# Patient Record
Sex: Female | Born: 1950 | Race: Black or African American | Hispanic: No | Marital: Single | Smoking: Never smoker
Health system: Southern US, Community
[De-identification: ages and names within clinical notes are randomized; demographics above are authoritative.]

## PROBLEM LIST (undated history)

## (undated) DIAGNOSIS — K31811 Angiodysplasia of stomach and duodenum with bleeding: Secondary | ICD-10-CM

## (undated) DIAGNOSIS — H543 Unqualified visual loss, both eyes: Secondary | ICD-10-CM

## (undated) DIAGNOSIS — M791 Myalgia, unspecified site: Secondary | ICD-10-CM

## (undated) DIAGNOSIS — H409 Unspecified glaucoma: Secondary | ICD-10-CM

## (undated) DIAGNOSIS — M1990 Unspecified osteoarthritis, unspecified site: Secondary | ICD-10-CM

## (undated) DIAGNOSIS — G629 Polyneuropathy, unspecified: Secondary | ICD-10-CM

## (undated) DIAGNOSIS — Z86010 Personal history of colonic polyps: Secondary | ICD-10-CM

## (undated) DIAGNOSIS — R0602 Shortness of breath: Secondary | ICD-10-CM

## (undated) DIAGNOSIS — D649 Anemia, unspecified: Secondary | ICD-10-CM

## (undated) DIAGNOSIS — K922 Gastrointestinal hemorrhage, unspecified: Secondary | ICD-10-CM

## (undated) DIAGNOSIS — Z8489 Family history of other specified conditions: Secondary | ICD-10-CM

## (undated) DIAGNOSIS — K219 Gastro-esophageal reflux disease without esophagitis: Secondary | ICD-10-CM

## (undated) DIAGNOSIS — I739 Peripheral vascular disease, unspecified: Secondary | ICD-10-CM

## (undated) DIAGNOSIS — Z5189 Encounter for other specified aftercare: Secondary | ICD-10-CM

## (undated) DIAGNOSIS — I509 Heart failure, unspecified: Secondary | ICD-10-CM

## (undated) DIAGNOSIS — I10 Essential (primary) hypertension: Secondary | ICD-10-CM

## (undated) DIAGNOSIS — F419 Anxiety disorder, unspecified: Secondary | ICD-10-CM

## (undated) DIAGNOSIS — K2211 Ulcer of esophagus with bleeding: Secondary | ICD-10-CM

## (undated) DIAGNOSIS — C50919 Malignant neoplasm of unspecified site of unspecified female breast: Secondary | ICD-10-CM

## (undated) DIAGNOSIS — J45909 Unspecified asthma, uncomplicated: Secondary | ICD-10-CM

## (undated) DIAGNOSIS — R011 Cardiac murmur, unspecified: Secondary | ICD-10-CM

## (undated) DIAGNOSIS — N186 End stage renal disease: Secondary | ICD-10-CM

## (undated) DIAGNOSIS — E119 Type 2 diabetes mellitus without complications: Secondary | ICD-10-CM

## (undated) HISTORY — PX: ABDOMINAL HYSTERECTOMY: SHX81

## (undated) HISTORY — DX: Unqualified visual loss, both eyes: H54.3

## (undated) HISTORY — DX: Gastro-esophageal reflux disease without esophagitis: K21.9

## (undated) HISTORY — DX: Angiodysplasia of stomach and duodenum with bleeding: K31.811

## (undated) HISTORY — PX: CARDIAC CATHETERIZATION: SHX172

## (undated) HISTORY — DX: Cardiac murmur, unspecified: R01.1

## (undated) HISTORY — DX: Ulcer of esophagus with bleeding: K22.11

## (undated) HISTORY — DX: Myalgia, unspecified site: M79.10

## (undated) HISTORY — PX: BREAST BIOPSY: SHX20

## (undated) HISTORY — DX: Anemia, unspecified: D64.9

## (undated) HISTORY — DX: Gastrointestinal hemorrhage, unspecified: K92.2

## (undated) HISTORY — DX: Essential (primary) hypertension: I10

## (undated) HISTORY — DX: Unspecified glaucoma: H40.9

## (undated) HISTORY — PX: CATARACT EXTRACTION W/ ANTERIOR VITRECTOMY: SHX1306

## (undated) HISTORY — PX: EYE SURGERY: SHX253

## (undated) HISTORY — PX: TONSILLECTOMY: SUR1361

## (undated) HISTORY — DX: Polyneuropathy, unspecified: G62.9

## (undated) HISTORY — PX: COLONOSCOPY: SHX174

## (undated) HISTORY — PX: BREAST LUMPECTOMY: SHX2

## (undated) HISTORY — PX: REFRACTIVE SURGERY: SHX103

## (undated) HISTORY — DX: Personal history of colonic polyps: Z86.010

## (undated) HISTORY — DX: Heart failure, unspecified: I50.9

## (undated) HISTORY — DX: Unspecified asthma, uncomplicated: J45.909

## (undated) HISTORY — PX: UPPER GASTROINTESTINAL ENDOSCOPY: SHX188

## (undated) HISTORY — DX: Unspecified osteoarthritis, unspecified site: M19.90

## (undated) HISTORY — DX: Encounter for other specified aftercare: Z51.89

---

## 1998-04-04 ENCOUNTER — Emergency Department (HOSPITAL_COMMUNITY): Admission: EM | Admit: 1998-04-04 | Discharge: 1998-04-04 | Payer: Self-pay | Admitting: Emergency Medicine

## 1998-04-04 ENCOUNTER — Encounter: Payer: Self-pay | Admitting: Emergency Medicine

## 1999-06-07 ENCOUNTER — Other Ambulatory Visit: Admission: RE | Admit: 1999-06-07 | Discharge: 1999-06-07 | Payer: Self-pay | Admitting: Obstetrics & Gynecology

## 2000-12-21 ENCOUNTER — Other Ambulatory Visit: Admission: RE | Admit: 2000-12-21 | Discharge: 2000-12-21 | Payer: Self-pay | Admitting: Obstetrics & Gynecology

## 2001-10-26 ENCOUNTER — Emergency Department (HOSPITAL_COMMUNITY): Admission: EM | Admit: 2001-10-26 | Discharge: 2001-10-26 | Payer: Self-pay | Admitting: *Deleted

## 2002-03-10 ENCOUNTER — Other Ambulatory Visit: Admission: RE | Admit: 2002-03-10 | Discharge: 2002-03-10 | Payer: Self-pay | Admitting: Obstetrics & Gynecology

## 2002-09-30 ENCOUNTER — Encounter: Admission: RE | Admit: 2002-09-30 | Discharge: 2002-09-30 | Payer: Self-pay | Admitting: *Deleted

## 2002-09-30 ENCOUNTER — Encounter: Payer: Self-pay | Admitting: *Deleted

## 2003-12-14 ENCOUNTER — Other Ambulatory Visit: Admission: RE | Admit: 2003-12-14 | Discharge: 2003-12-14 | Payer: Self-pay | Admitting: Obstetrics & Gynecology

## 2004-06-25 ENCOUNTER — Observation Stay (HOSPITAL_COMMUNITY): Admission: EM | Admit: 2004-06-25 | Discharge: 2004-06-26 | Payer: Self-pay | Admitting: Emergency Medicine

## 2004-11-25 ENCOUNTER — Encounter: Admission: RE | Admit: 2004-11-25 | Discharge: 2004-11-25 | Payer: Self-pay | Admitting: Orthopedic Surgery

## 2004-12-17 ENCOUNTER — Encounter: Admission: RE | Admit: 2004-12-17 | Discharge: 2004-12-17 | Payer: Self-pay | Admitting: Orthopedic Surgery

## 2005-01-01 ENCOUNTER — Encounter: Admission: RE | Admit: 2005-01-01 | Discharge: 2005-01-01 | Payer: Self-pay | Admitting: Orthopedic Surgery

## 2005-06-02 ENCOUNTER — Other Ambulatory Visit: Admission: RE | Admit: 2005-06-02 | Discharge: 2005-06-02 | Payer: Self-pay | Admitting: Obstetrics & Gynecology

## 2006-08-24 ENCOUNTER — Ambulatory Visit (HOSPITAL_COMMUNITY): Admission: RE | Admit: 2006-08-24 | Discharge: 2006-08-24 | Payer: Self-pay | Admitting: Internal Medicine

## 2006-08-24 ENCOUNTER — Ambulatory Visit: Payer: Self-pay | Admitting: Vascular Surgery

## 2006-11-12 ENCOUNTER — Emergency Department (HOSPITAL_COMMUNITY): Admission: EM | Admit: 2006-11-12 | Discharge: 2006-11-12 | Payer: Self-pay | Admitting: Emergency Medicine

## 2007-02-02 ENCOUNTER — Encounter: Admission: RE | Admit: 2007-02-02 | Discharge: 2007-02-02 | Payer: Self-pay | Admitting: Obstetrics & Gynecology

## 2007-08-23 ENCOUNTER — Ambulatory Visit (HOSPITAL_COMMUNITY): Admission: RE | Admit: 2007-08-23 | Discharge: 2007-08-23 | Payer: Self-pay | Admitting: Ophthalmology

## 2009-02-06 ENCOUNTER — Emergency Department (HOSPITAL_COMMUNITY): Admission: EM | Admit: 2009-02-06 | Discharge: 2009-02-06 | Payer: Self-pay | Admitting: Emergency Medicine

## 2009-04-17 ENCOUNTER — Emergency Department (HOSPITAL_COMMUNITY): Admission: EM | Admit: 2009-04-17 | Discharge: 2009-04-17 | Payer: Self-pay | Admitting: Emergency Medicine

## 2009-12-21 ENCOUNTER — Emergency Department (HOSPITAL_COMMUNITY): Admission: EM | Admit: 2009-12-21 | Discharge: 2009-12-21 | Payer: Self-pay | Admitting: Emergency Medicine

## 2010-06-12 ENCOUNTER — Encounter: Admission: RE | Admit: 2010-06-12 | Discharge: 2010-06-12 | Payer: Self-pay | Admitting: Obstetrics & Gynecology

## 2010-06-19 ENCOUNTER — Encounter: Admission: RE | Admit: 2010-06-19 | Discharge: 2010-06-19 | Payer: Self-pay | Admitting: Obstetrics & Gynecology

## 2010-06-27 ENCOUNTER — Ambulatory Visit: Payer: Self-pay | Admitting: Oncology

## 2010-07-09 LAB — CBC WITH DIFFERENTIAL/PLATELET
Basophils Absolute: 0 10*3/uL (ref 0.0–0.1)
Eosinophils Absolute: 0.2 10*3/uL (ref 0.0–0.5)
HCT: 34.9 % (ref 34.8–46.6)
HGB: 11.4 g/dL — ABNORMAL LOW (ref 11.6–15.9)
LYMPH%: 40.6 % (ref 14.0–49.7)
MCV: 84.9 fL (ref 79.5–101.0)
MONO#: 0.5 10*3/uL (ref 0.1–0.9)
MONO%: 6.7 % (ref 0.0–14.0)
NEUT#: 3.6 10*3/uL (ref 1.5–6.5)
Platelets: 384 10*3/uL (ref 145–400)
RBC: 4.11 10*6/uL (ref 3.70–5.45)
WBC: 7.2 10*3/uL (ref 3.9–10.3)

## 2010-07-09 LAB — COMPREHENSIVE METABOLIC PANEL
Albumin: 2.8 g/dL — ABNORMAL LOW (ref 3.5–5.2)
Alkaline Phosphatase: 75 U/L (ref 39–117)
BUN: 18 mg/dL (ref 6–23)
CO2: 29 mEq/L (ref 19–32)
Glucose, Bld: 308 mg/dL — ABNORMAL HIGH (ref 70–99)
Sodium: 138 mEq/L (ref 135–145)
Total Bilirubin: 0.5 mg/dL (ref 0.3–1.2)
Total Protein: 6.6 g/dL (ref 6.0–8.3)

## 2010-07-09 LAB — CANCER ANTIGEN 27.29: CA 27.29: 34 U/mL (ref 0–39)

## 2010-07-11 ENCOUNTER — Encounter: Payer: Self-pay | Admitting: Oncology

## 2010-07-11 ENCOUNTER — Ambulatory Visit
Admission: RE | Admit: 2010-07-11 | Discharge: 2010-07-11 | Payer: Self-pay | Source: Home / Self Care | Attending: Oncology | Admitting: Oncology

## 2010-07-18 LAB — CBC WITH DIFFERENTIAL/PLATELET
BASO%: 0.5 % (ref 0.0–2.0)
Eosinophils Absolute: 0.1 10*3/uL (ref 0.0–0.5)
HCT: 36.1 % (ref 34.8–46.6)
MCHC: 33 g/dL (ref 31.5–36.0)
MONO#: 0.5 10*3/uL (ref 0.1–0.9)
NEUT#: 2.8 10*3/uL (ref 1.5–6.5)
RBC: 4.26 10*6/uL (ref 3.70–5.45)
WBC: 5.9 10*3/uL (ref 3.9–10.3)
lymph#: 2.5 10*3/uL (ref 0.9–3.3)
nRBC: 0 % (ref 0–0)

## 2010-07-18 LAB — COMPREHENSIVE METABOLIC PANEL
ALT: 24 U/L (ref 0–35)
AST: 23 U/L (ref 0–37)
Albumin: 3.4 g/dL — ABNORMAL LOW (ref 3.5–5.2)
CO2: 27 mEq/L (ref 19–32)
Calcium: 9.1 mg/dL (ref 8.4–10.5)
Chloride: 104 mEq/L (ref 96–112)
Potassium: 4.3 mEq/L (ref 3.5–5.3)
Total Protein: 6.5 g/dL (ref 6.0–8.3)

## 2010-07-26 ENCOUNTER — Encounter (INDEPENDENT_AMBULATORY_CARE_PROVIDER_SITE_OTHER): Payer: Self-pay | Admitting: General Surgery

## 2010-07-26 ENCOUNTER — Ambulatory Visit (HOSPITAL_COMMUNITY)
Admission: RE | Admit: 2010-07-26 | Discharge: 2010-07-26 | Payer: Self-pay | Source: Home / Self Care | Attending: General Surgery | Admitting: General Surgery

## 2010-07-26 ENCOUNTER — Encounter
Admission: RE | Admit: 2010-07-26 | Discharge: 2010-07-26 | Payer: Self-pay | Source: Home / Self Care | Attending: General Surgery | Admitting: General Surgery

## 2010-07-26 LAB — GLUCOSE, CAPILLARY
Glucose-Capillary: 86 mg/dL (ref 70–99)
Glucose-Capillary: 86 mg/dL (ref 70–99)
Glucose-Capillary: 100 mg/dL — ABNORMAL HIGH (ref 70–99)

## 2010-07-31 ENCOUNTER — Encounter: Payer: Self-pay | Admitting: Oncology

## 2010-07-31 ENCOUNTER — Ambulatory Visit (HOSPITAL_COMMUNITY)
Admission: RE | Admit: 2010-07-31 | Discharge: 2010-07-31 | Payer: Self-pay | Source: Home / Self Care | Attending: Oncology | Admitting: Oncology

## 2010-07-31 ENCOUNTER — Ambulatory Visit: Payer: Self-pay | Admitting: Oncology

## 2010-08-01 LAB — CBC WITH DIFFERENTIAL/PLATELET
BASO%: 0.6 % (ref 0.0–2.0)
Basophils Absolute: 0 10*3/uL (ref 0.0–0.1)
EOS%: 2.8 % (ref 0.0–7.0)
Eosinophils Absolute: 0.2 10*3/uL (ref 0.0–0.5)
HCT: 33.8 % — ABNORMAL LOW (ref 34.8–46.6)
HGB: 11 g/dL — ABNORMAL LOW (ref 11.6–15.9)
LYMPH%: 36.2 % (ref 14.0–49.7)
MCH: 27.6 pg (ref 25.1–34.0)
MCHC: 32.5 g/dL (ref 31.5–36.0)
MCV: 84.9 fL (ref 79.5–101.0)
MONO#: 0.5 10*3/uL (ref 0.1–0.9)
MONO%: 7.4 % (ref 0.0–14.0)
NEUT#: 3.4 10*3/uL (ref 1.5–6.5)
NEUT%: 53 % (ref 38.4–76.8)
Platelets: 373 10*3/uL (ref 145–400)
RBC: 3.98 10*6/uL (ref 3.70–5.45)
RDW: 13.5 % (ref 11.2–14.5)
WBC: 6.5 10*3/uL (ref 3.9–10.3)
lymph#: 2.3 10*3/uL (ref 0.9–3.3)
nRBC: 0 % (ref 0–0)

## 2010-08-01 LAB — COMPREHENSIVE METABOLIC PANEL
ALT: 27 U/L (ref 0–35)
AST: 21 U/L (ref 0–37)
Albumin: 3.4 g/dL — ABNORMAL LOW (ref 3.5–5.2)
Alkaline Phosphatase: 80 U/L (ref 39–117)
BUN: 17 mg/dL (ref 6–23)
CO2: 25 mEq/L (ref 19–32)
Calcium: 8.8 mg/dL (ref 8.4–10.5)
Chloride: 102 mEq/L (ref 96–112)
Creatinine, Ser: 0.9 mg/dL (ref 0.40–1.20)
Glucose, Bld: 299 mg/dL — ABNORMAL HIGH (ref 70–99)
Potassium: 4.4 mEq/L (ref 3.5–5.3)
Sodium: 137 mEq/L (ref 135–145)
Total Bilirubin: 0.4 mg/dL (ref 0.3–1.2)
Total Protein: 6.5 g/dL (ref 6.0–8.3)

## 2010-08-05 LAB — GLUCOSE, CAPILLARY
Glucose-Capillary: 124 mg/dL — ABNORMAL HIGH (ref 70–99)
Glucose-Capillary: 142 mg/dL — ABNORMAL HIGH (ref 70–99)

## 2010-08-07 LAB — CBC WITH DIFFERENTIAL/PLATELET
BASO%: 0.2 % (ref 0.0–2.0)
Basophils Absolute: 0 10*3/uL (ref 0.0–0.1)
EOS%: 0.6 % (ref 0.0–7.0)
Eosinophils Absolute: 0.1 10*3/uL (ref 0.0–0.5)
HCT: 29.5 % — ABNORMAL LOW (ref 34.8–46.6)
HGB: 9.8 g/dL — ABNORMAL LOW (ref 11.6–15.9)
LYMPH%: 7.8 % — ABNORMAL LOW (ref 14.0–49.7)
MCH: 27.7 pg (ref 25.1–34.0)
MCHC: 33.2 g/dL (ref 31.5–36.0)
MCV: 83.3 fL (ref 79.5–101.0)
MONO#: 1.5 10*3/uL — ABNORMAL HIGH (ref 0.1–0.9)
MONO%: 10.4 % (ref 0.0–14.0)
NEUT#: 11.8 10*3/uL — ABNORMAL HIGH (ref 1.5–6.5)
NEUT%: 81 % — ABNORMAL HIGH (ref 38.4–76.8)
Platelets: 374 10*3/uL (ref 145–400)
RBC: 3.54 10*6/uL — ABNORMAL LOW (ref 3.70–5.45)
RDW: 13.5 % (ref 11.2–14.5)
WBC: 14.5 10*3/uL — ABNORMAL HIGH (ref 3.9–10.3)
lymph#: 1.1 10*3/uL (ref 0.9–3.3)
nRBC: 0 % (ref 0–0)

## 2010-08-08 ENCOUNTER — Inpatient Hospital Stay (HOSPITAL_COMMUNITY)
Admission: AD | Admit: 2010-08-08 | Discharge: 2010-08-11 | Payer: Self-pay | Source: Home / Self Care | Attending: General Surgery | Admitting: General Surgery

## 2010-08-12 LAB — GLUCOSE, CAPILLARY
Glucose-Capillary: 327 mg/dL — ABNORMAL HIGH (ref 70–99)
Glucose-Capillary: 382 mg/dL — ABNORMAL HIGH (ref 70–99)
Glucose-Capillary: 162 mg/dL — ABNORMAL HIGH (ref 70–99)

## 2010-08-12 LAB — CBC
Hemoglobin: 8.7 g/dL — ABNORMAL LOW (ref 12.0–15.0)
MCH: 27.5 pg (ref 26.0–34.0)
MCV: 82.6 fL (ref 78.0–100.0)
RBC: 3.16 MIL/uL — ABNORMAL LOW (ref 3.87–5.11)

## 2010-08-12 LAB — BASIC METABOLIC PANEL
CO2: 26 mEq/L (ref 19–32)
Calcium: 8.4 mg/dL (ref 8.4–10.5)
Chloride: 96 mEq/L (ref 96–112)
GFR calc Af Amer: 54 mL/min — ABNORMAL LOW (ref >60)
Sodium: 129 mEq/L — ABNORMAL LOW (ref 135–145)

## 2010-08-13 LAB — COMPREHENSIVE METABOLIC PANEL
ALT: 91 U/L — ABNORMAL HIGH (ref 0–35)
AST: 51 U/L — ABNORMAL HIGH (ref 0–37)
Albumin: 1.6 g/dL — ABNORMAL LOW (ref 3.5–5.2)
Alkaline Phosphatase: 146 U/L — ABNORMAL HIGH (ref 39–117)
BUN: 20 mg/dL (ref 6–23)
Chloride: 107 mEq/L (ref 96–112)
GFR calc Af Amer: >60 mL/min (ref >60)
Potassium: 3.3 mEq/L — ABNORMAL LOW (ref 3.5–5.1)
Sodium: 141 mEq/L (ref 135–145)
Total Bilirubin: 0.3 mg/dL (ref 0.3–1.2)
Total Protein: 5.8 g/dL — ABNORMAL LOW (ref 6.0–8.3)

## 2010-08-13 LAB — CBC
HCT: 25.2 % — ABNORMAL LOW (ref 36.0–46.0)
Hemoglobin: 8.3 g/dL — ABNORMAL LOW (ref 12.0–15.0)
MCH: 27.5 pg (ref 26.0–34.0)
MCHC: 32.9 g/dL (ref 30.0–36.0)
MCV: 83.4 fL (ref 78.0–100.0)
Platelets: 496 K/uL — ABNORMAL HIGH (ref 150–400)
RBC: 3.02 MIL/uL — ABNORMAL LOW (ref 3.87–5.11)
RDW: 13.8 % (ref 11.5–15.5)
WBC: 9 K/uL (ref 4.0–10.5)

## 2010-08-13 LAB — GLUCOSE, CAPILLARY
Glucose-Capillary: 104 mg/dL — ABNORMAL HIGH (ref 70–99)
Glucose-Capillary: 143 mg/dL — ABNORMAL HIGH (ref 70–99)
Glucose-Capillary: 147 mg/dL — ABNORMAL HIGH (ref 70–99)
Glucose-Capillary: 174 mg/dL — ABNORMAL HIGH (ref 70–99)

## 2010-08-13 LAB — TSH: TSH: 1.19 u[IU]/mL (ref 0.350–4.500)

## 2010-08-13 LAB — IRON AND TIBC
Iron: 25 ug/dL — ABNORMAL LOW (ref 42–135)
Saturation Ratios: 17 % — ABNORMAL LOW (ref 20–55)
TIBC: 144 ug/dL — ABNORMAL LOW (ref 250–470)
UIBC: 119 ug/dL

## 2010-09-01 NOTE — Discharge Summary (Signed)
[name], [name]              ACCOUNT NO.:  [id number]  MEDICAL RECORD NO.:  [id number]          PATIENT TYPE:  INP  LOCATION:  1519                         FACILITY:  [facility]  PHYSICIAN:  Edsel Petrin. Dalbert Batman, M.D.DATE OF BIRTH:  1950-12-14  DATE OF ADMISSION:  08/08/2010 DATE OF DISCHARGE:  08/11/2010                              DISCHARGE SUMMARY   FINAL DIAGNOSES: 1. Left axillary abscess. 2. Invasive cancer, left breast, stage T1c N0, status post recent left     partial mastectomy and left axillary sentinel node biopsy. 3. Diabetes mellitus. 4. Hypertension. 5. Gastroesophageal reflux disease. 6. Iron deficiency anemia.  OPERATION PERFORMED:  A bedside wound drainage procedure.  HISTORY:  This is a 59 year old African American female with diabetes and hypertension.  She had recently undergone a left lumpectomy and sentinel node biopsy for a stage T1c N0 tumor.  She presented back within 8 or 9 days with swelling and redness of the left axillary incision.  She saw Dr. Rolm Bookbinder in the office who partially drained an abscess with purulent drainage.  He admitted her to the hospital and put her on broad-spectrum antibiotics and diabetes management.  PHYSICAL EXAMINATION:  VITAL SIGNS:  Temperature 97.3. LUNGS:  Clear to auscultation. HEART:  Regular rate and rhythm, no murmur. CHEST:  Breast incision looked fine on the left breast.  Left actually incision was swollen and tender and erythematous with purulent drainage.  HOSPITAL COURSE:  The patient was admitted and started on IV fluids, broad-spectrum antibiotics, and sliding scale insulin as well as her usual medications.  The following morning, I saw her and she was stable and the pain was better.  She still had purulent drainage from the wound and so I explored the wound at the bedside.  I removed a couple of Vicryl sutures and was able to completely open up the wound and drain it completely out and irrigate  it out and pack it all the way to the bottom of the wound.  Because her initial blood sugar on admission was 371, I asked her primary care physician, Dr. Berneta Sages, to evaluate and manage her diabetes.  He was kind enough to come and assist with adjustment of her medications and sliding scale and her blood sugars came under nice control with that.  We began wound care and she responded quite promptly.  She was continued on intravenous Unasyn.  She was discharged on January 22nd.  At that time, she was doing well, was very comfortable, was afebrile.  Blood sugars were down to 147.  For her iron deficiency anemia, she received IV iron therapy on January 21st at the direction of Dr. Einar Grad Avva's group.  He felt that she was stable on her current medications for diabetes and felt that she would need to continue 70/30 insulin as an outpatient.  We arranged for home health nursing to help her with b.i.d. dressing changes.  DISCHARGE MEDICATIONS: 1. Augmentin 875 mg p.o. b.i.d. for 7 days. 2. Prilosec OTC 20 mg a day. 3. Aspirin 81 mg a day. 4. Lasix 80 mg a day. 5. Humulin 70/30 insulin 3 times daily  as a sliding scale. 6. Ibuprofen 800 mg 1/2 to 1 tablet by mouth every 8 hours as needed. 7. Iron 65 mg daily. 8. Lisinopril 40 mg daily. 9. Metformin 500 mg daily. 10.Multivitamins daily. 11.Polyethylene glycol p.r.n. constipation. 12.Tylenol p.r.n. pain.  DISCHARGE INSTRUCTIONS:  She was asked to return to see me in my office in 3-5 days.  [facility] for home health was arranged.  BID dressing changes will continue.     Edsel Petrin. Dalbert Batman, M.D.     HMI/MEDQ  D:  08/27/2010  T:  08/28/2010  Job:  [id number]  cc:   Berneta Sages, M.D. [phone]  Electronically Signed by Fanny Skates M.D. on 09/01/2010 03:30:17 PM

## 2010-09-04 NOTE — Consult Note (Signed)
NAMEMARTEEN, [name]              ACCOUNT NO.:  [id number]  MEDICAL RECORD NO.:  [id number]          PATIENT TYPE:  INP  LOCATION:  [id number]                         FACILITY:  [facility]  PHYSICIAN:  Berneta Sages, M.D.        DATE OF BIRTH:  [birth year]  DATE OF CONSULTATION:  08/09/2010 DATE OF DISCHARGE:                                CONSULTATION   REASON FOR CONSULTATION:  Type 2 diabetes management and general medical issues.  HISTORY OF PRESENT ILLNESS:  This is a 60 year old African-American female who has a long history of poorly controlled type 2 diabetes mellitus complicated by noncompliance and poor fund of knowledge who is insulin-dependent and recently diagnosed with invasive ductal carcinoma of the left breast in November 2011.  She is status post Port-A-Cath placement and recently noted a worsening lump and pain in the left axillary area.  This was complicated by fevers and hyperglycemia despite using her scheduled insulin in a compliant manner over the last 1-2 weeks.  She was seen by the general surgeons on August 08, 2010, where she was found to have an abscess which was attempted to be drained inside the office.  However, given the severity, pain and size, she was admitted to [facility] for surgical management.  She did undergo that within the last 24 hours with significant relief of her pain and nausea.  We have been asked to manage her medical issues including her uncontrolled type 2 diabetes in the setting of hyponatremia, hypertension and anemia.  Currently the patient states that her nausea has improved significantly and her pain has improved significantly status post incision and drainage of the left axillary abscess.  She denies any chest pain, shortness of breath, abdominal pain, however, does note constipation complicated by her use of iron supplementation.  She denies any focal neurological deficits but does continue to have some upper extremity  paresthesias that are somewhat chronic and possibly related to her known history of cervical degenerative disk disease.  She was actually evaluated for that in 2011 through emergency room evaluation and neurological consultation where she was ruled out for acute strokes and MS based on exam and radiological evaluation.  She now has been admitted for IV antibiotics status post drainage of the abscess with hopeful plans for quick discharge in the next 2-3 days per surgical admission.  REVIEW OF SYSTEMS:  Positive for fevers, chills, nausea, no vomiting, right shoulder discomfort and pain.  Negative for new focal neurological deficits, musculoskeletal issues.  Negative for blood in stool or urine. Negative for acute visual changes.  Negative for headaches.  Positive for polydipsia and polyuria.  Positive for some GERD, fairly well- controlled with outpatient proton pump inhibitors.  The patient states that her Port-A-Cath area has not been bothersome.  PROBLEM LIST: 1. Type 2 diabetes, uncontrolled with last hemoglobin A1c being 10.7%     in our office in December 2011.  However, with recent attempts on     the patient's part for better control by taking her insulin twice     daily, where as in the past she was lucky to  take it once a day.     She has missed several followup office visits in our office and her     knowledge base and fund of knowledge for preventing microvascular     and macrovascular complications including infection is quite     limited. 2. Hypertension. 3. Hyperlipidemia with deferral of statin therapy in the past and     possibly complicated by poor dietary habits. 4. Cervical radiculopathy with the patient deferring outpatient     neurological evaluation. 5. Mood disorder. 6. Gastroesophageal reflux disease. 7. New diagnosis of invasive ductal carcinoma of the breast diagnosed     2009/10/21, status post surgical excision by Dr. Fanny Skates followed     by  chemotherapy by Dr. Humphrey Rolls.  PAST SURGICAL HISTORY:  Partial hysterectomy in 10-22-94, tubal ligation, tonsillectomy and now surgical excision of the invasive ductal carcinoma.  FAMILY HISTORY:  Significant for father having died at the age 52. Mother having died at the age of 45 with complications of type 2 diabetes, goiter and hypertension.  The patient does have two brothers and a sister.  One brother with diabetes and end-stage renal disease deceased in 2003-10-22.  SOCIAL HISTORY:  The patient is single with two children.  Does have a significant other.  Is occupied at a mill.  Denies any tobacco or alcohol use.  CURRENT MEDICATIONS: 1. Lisinopril 40 mg each day. 2. Furosemide 80 mg each day. 3. Multivitamin one p.o. daily. 4. Omeprazole 20 mg each day. 5. Metformin 500 mg to be taken with the largest meal of the day. 6. Humalog 70/30 with most recent instructions to check blood sugars     twice a day with if the morning sugar is less than 100 to give 30     units, greater than 100 to give 40 units, and greater than 200 to     give 45 units.  In the evening if sugar is less than 100 to give no     insulin, if it is 101-150 to give 35 units, 151-200 to give 40     units, 201-250 to give 45 units and greater than 250 to give 50     units and she was to follow up in our office if she has any     recurrent episodes of low sugars less than 70 and she was also to     see our nurse practitioner, Collie Siad Drinkard, for further evaluation     and management on a regular basis to possibly include monthly     visits for optimization of her insulin regimen.  ALLERGIES:  She does have allergies to HYDROCODONE, possibly resulting in significant nausea and vomiting.  LABORATORY DATA:  Current labs include a sodium of 129, potassium 3.5, BUN 27, creatinine 1.23, serum CO2 26, glucose 371, calcium 8.4.  White blood cell count 14.3, hemoglobin 8.7, hematocrit 26.1%, platelet count 439.  The patient is  needing 100% of her intake.  Last CBG reveals 186 and 382.  PHYSICAL EXAMINATION:  GENERAL:  We have a pleasant African-American female sitting on the edge of her bed, answering all questions appropriately, in no apparent distress, status post surgery with family present. VITAL SIGNS:  Temperature 99.1 degrees Fahrenheit, pulse 83, respirations 20 and nonlabored, oxygen saturation 93% on room air, blood pressure 134/75. HEENT:  Sclerae anicteric.  Extraocular movements are intact.  Face is symmetric.  Tongue is midline. NECK:  Supple.  There is no cervical lymphadenopathy.  Left shoulder  and Port-A-Cath area were not examined. LUNGS:  Clear to auscultation bilaterally. CARDIOVASCULAR:  Exam reveals a regular rate and rhythm without murmurs appreciated. ABDOMEN:  Exam reveals a soft, nontender, nondistended abdomen.  Bowel sounds are present. EXTREMITIES:  Exam reveals no edema.  Pedal pulses are intact with no evidence of cyanosis or edema.  The patient can move all four extremities, albeit with limited range of motion of the left upper extremity secondary to recent surgery. NEUROLOGIC:  Exam is remarkable for being able to move all four extremities with some subjective decrease in light touch in the peripheral extremities.  No tremors.  The patient is alert and oriented x3.  ASSESSMENT AND PLAN: 1. Type 2 diabetes, uncontrolled, complicated by history of     noncompliance and poor fund of knowledge but recently the patient     has been relatively good with twice daily capillary blood glucose     measurements as well as insulin administration but complicated by     current infection.  We will be aggressive with insulin and sliding     scale.  We will keep the patient on 70/30 insulin given cost issues     and noncompliance with other forms of insulin on an outpatient     basis.  We will restart home regimen and dose escalate over the     weekend. 2. Hyponatremia.  Unclear  etiology but currently the patient is     receiving normal saline with her intravenous fluids and I will     recheck sodium in the morning as well as a thyroid-stimulating     hormone. 3. Hypertension.  Blood pressure controlled with reasonable renal     parameters at this time on current medications. 4. Gastroesophageal reflux disease.  We will continue proton pump     inhibitor. 5. Anemia.  Question iron deficiency and/or possible blood loss anemia     secondary to recent surgical interventions.  The patient may not be     compliant with iron secondary to constipation.  We will check iron     levels and consider intravenous iron infusion per Pharmacy protocol     if the iron levels are low. 6. Breast cancer.  Per Dr. Dalbert Batman and Dr. Humphrey Rolls but healing and     prevention of infection will be dependent on adequate glycemic     control and we will try to facilitate this on an outpatient basis.     Berneta Sages, M.D.     RA/MEDQ  D:  08/09/2010  T:  08/09/2010  Job:  [id number]  cc:   Edsel Petrin. Dalbert Batman, M.D. [id number] [address], [address] [place] [address]  Marcy Panning, MD Fax: [phone]  Electronically Signed by Berneta Sages M.D. on 09/04/2010 09:59:30 PM

## 2010-09-11 ENCOUNTER — Encounter: Payer: PRIVATE HEALTH INSURANCE | Admitting: Oncology

## 2010-09-30 LAB — COMPREHENSIVE METABOLIC PANEL
ALT: 40 U/L — ABNORMAL HIGH (ref 0–35)
Albumin: 3.3 g/dL — ABNORMAL LOW (ref 3.5–5.2)
Calcium: 9.5 mg/dL (ref 8.4–10.5)
GFR calc Af Amer: >60 mL/min (ref >60)
Glucose, Bld: 255 mg/dL — ABNORMAL HIGH (ref 70–99)
Sodium: 138 mEq/L (ref 135–145)
Total Protein: 7.1 g/dL (ref 6.0–8.3)

## 2010-09-30 LAB — URINALYSIS, ROUTINE W REFLEX MICROSCOPIC
Glucose, UA: >1000 mg/dL — AB
Protein, ur: >300 mg/dL — AB
Specific Gravity, Urine: 1.027 (ref 1.005–1.030)

## 2010-09-30 LAB — URINE MICROSCOPIC-ADD ON

## 2010-09-30 LAB — SURGICAL PCR SCREEN
MRSA, PCR: NEGATIVE
Staphylococcus aureus: NEGATIVE

## 2010-09-30 LAB — DIFFERENTIAL
Eosinophils Absolute: 0.2 10*3/uL (ref 0.0–0.7)
Lymphs Abs: 3.6 10*3/uL (ref 0.7–4.0)
Monocytes Relative: 6 % (ref 3–12)
Neutrophils Relative %: 50 % (ref 43–77)

## 2010-09-30 LAB — CBC
MCHC: 32.9 g/dL (ref 30.0–36.0)
Platelets: 415 10*3/uL — ABNORMAL HIGH (ref 150–400)
RDW: 13.3 % (ref 11.5–15.5)

## 2010-10-04 ENCOUNTER — Encounter (HOSPITAL_BASED_OUTPATIENT_CLINIC_OR_DEPARTMENT_OTHER): Payer: PRIVATE HEALTH INSURANCE | Admitting: Oncology

## 2010-10-04 ENCOUNTER — Other Ambulatory Visit: Payer: Self-pay | Admitting: Oncology

## 2010-10-04 DIAGNOSIS — C50419 Malignant neoplasm of upper-outer quadrant of unspecified female breast: Secondary | ICD-10-CM

## 2010-10-04 DIAGNOSIS — N644 Mastodynia: Secondary | ICD-10-CM

## 2010-10-04 DIAGNOSIS — C50919 Malignant neoplasm of unspecified site of unspecified female breast: Secondary | ICD-10-CM

## 2010-10-04 DIAGNOSIS — R509 Fever, unspecified: Secondary | ICD-10-CM

## 2010-10-04 LAB — COMPREHENSIVE METABOLIC PANEL
AST: 28 U/L (ref 0–37)
Albumin: 3.6 g/dL (ref 3.5–5.2)
Alkaline Phosphatase: 79 U/L (ref 39–117)
Potassium: 3.9 mEq/L (ref 3.5–5.3)
Sodium: 138 mEq/L (ref 135–145)
Total Protein: 6.7 g/dL (ref 6.0–8.3)

## 2010-10-04 LAB — CBC WITH DIFFERENTIAL/PLATELET
BASO%: 0.4 % (ref 0.0–2.0)
EOS%: 2 % (ref 0.0–7.0)
MCH: 27.3 pg (ref 25.1–34.0)
MCHC: 32.5 g/dL (ref 31.5–36.0)
RBC: 3.88 10*6/uL (ref 3.70–5.45)
RDW: 14.5 % (ref 11.2–14.5)
lymph#: 2.9 10*3/uL (ref 0.9–3.3)

## 2010-10-07 LAB — DIFFERENTIAL
Basophils Absolute: 0.1 10*3/uL (ref 0.0–0.1)
Basophils Relative: 1 % (ref 0–1)
Eosinophils Absolute: 0.1 10*3/uL (ref 0.0–0.7)
Eosinophils Relative: 2 % (ref 0–5)
Lymphocytes Relative: 44 % (ref 12–46)

## 2010-10-07 LAB — BASIC METABOLIC PANEL
BUN: 21 mg/dL (ref 6–23)
Calcium: 8.9 mg/dL (ref 8.4–10.5)
GFR calc non Af Amer: 55 mL/min — ABNORMAL LOW (ref >60)
Glucose, Bld: 182 mg/dL — ABNORMAL HIGH (ref 70–99)
Potassium: 3.9 mEq/L (ref 3.5–5.1)

## 2010-10-07 LAB — CBC
HCT: 36.4 % (ref 36.0–46.0)
MCHC: 32.7 g/dL (ref 30.0–36.0)
Platelets: 353 10*3/uL (ref 150–400)
RDW: 13.8 % (ref 11.5–15.5)

## 2010-10-07 LAB — POCT CARDIAC MARKERS: Troponin i, poc: <0.05 ng/mL (ref 0.00–0.09)

## 2010-10-11 ENCOUNTER — Encounter (HOSPITAL_BASED_OUTPATIENT_CLINIC_OR_DEPARTMENT_OTHER): Payer: PRIVATE HEALTH INSURANCE | Admitting: Oncology

## 2010-10-11 ENCOUNTER — Other Ambulatory Visit: Payer: Self-pay | Admitting: Oncology

## 2010-10-11 ENCOUNTER — Other Ambulatory Visit: Payer: Self-pay | Admitting: Physician Assistant

## 2010-10-11 DIAGNOSIS — Z5111 Encounter for antineoplastic chemotherapy: Secondary | ICD-10-CM

## 2010-10-11 DIAGNOSIS — N644 Mastodynia: Secondary | ICD-10-CM

## 2010-10-11 DIAGNOSIS — C50919 Malignant neoplasm of unspecified site of unspecified female breast: Secondary | ICD-10-CM

## 2010-10-11 DIAGNOSIS — R509 Fever, unspecified: Secondary | ICD-10-CM

## 2010-10-11 LAB — COMPREHENSIVE METABOLIC PANEL
Alkaline Phosphatase: 76 U/L (ref 39–117)
BUN: 18 mg/dL (ref 6–23)
CO2: 24 mEq/L (ref 19–32)
Glucose, Bld: 94 mg/dL (ref 70–99)
Total Bilirubin: 0.3 mg/dL (ref 0.3–1.2)

## 2010-10-11 LAB — CBC WITH DIFFERENTIAL/PLATELET
BASO%: 2.1 % — ABNORMAL HIGH (ref 0.0–2.0)
Basophils Absolute: 0.1 10*3/uL (ref 0.0–0.1)
EOS%: 1.9 % (ref 0.0–7.0)
HCT: 30 % — ABNORMAL LOW (ref 34.8–46.6)
LYMPH%: 40.7 % (ref 14.0–49.7)
MCH: 28.5 pg (ref 25.1–34.0)
MCHC: 33.3 g/dL (ref 31.5–36.0)
MCV: 85.7 fL (ref 79.5–101.0)
MONO%: 2.8 % (ref 0.0–14.0)
NEUT%: 52.5 % (ref 38.4–76.8)
Platelets: 317 10*3/uL (ref 145–400)

## 2010-10-12 ENCOUNTER — Encounter (HOSPITAL_BASED_OUTPATIENT_CLINIC_OR_DEPARTMENT_OTHER): Payer: PRIVATE HEALTH INSURANCE | Admitting: Oncology

## 2010-10-12 DIAGNOSIS — C50919 Malignant neoplasm of unspecified site of unspecified female breast: Secondary | ICD-10-CM

## 2010-10-12 DIAGNOSIS — Z5189 Encounter for other specified aftercare: Secondary | ICD-10-CM

## 2010-10-18 ENCOUNTER — Other Ambulatory Visit: Payer: Self-pay | Admitting: Oncology

## 2010-10-18 ENCOUNTER — Encounter (HOSPITAL_BASED_OUTPATIENT_CLINIC_OR_DEPARTMENT_OTHER): Payer: PRIVATE HEALTH INSURANCE | Admitting: Oncology

## 2010-10-18 DIAGNOSIS — C50919 Malignant neoplasm of unspecified site of unspecified female breast: Secondary | ICD-10-CM

## 2010-10-18 DIAGNOSIS — N644 Mastodynia: Secondary | ICD-10-CM

## 2010-10-18 DIAGNOSIS — R509 Fever, unspecified: Secondary | ICD-10-CM

## 2010-10-18 LAB — CBC WITH DIFFERENTIAL/PLATELET
Basophils Absolute: 0 10*3/uL (ref 0.0–0.1)
Eosinophils Absolute: 0.1 10*3/uL (ref 0.0–0.5)
HGB: 9.4 g/dL — ABNORMAL LOW (ref 11.6–15.9)
LYMPH%: 50.3 % — ABNORMAL HIGH (ref 14.0–49.7)
MCV: 85.6 fL (ref 79.5–101.0)
MONO#: 0.2 10*3/uL (ref 0.1–0.9)
MONO%: 7.1 % (ref 0.0–14.0)
NEUT#: 1.1 10*3/uL — ABNORMAL LOW (ref 1.5–6.5)
Platelets: 193 10*3/uL (ref 145–400)
RBC: 3.27 10*6/uL — ABNORMAL LOW (ref 3.70–5.45)
RDW: 14.6 % — ABNORMAL HIGH (ref 11.2–14.5)
WBC: 3 10*3/uL — ABNORMAL LOW (ref 3.9–10.3)

## 2010-10-18 LAB — BASIC METABOLIC PANEL
Calcium: 8.3 mg/dL — ABNORMAL LOW (ref 8.4–10.5)
Creatinine, Ser: 0.9 mg/dL (ref 0.40–1.20)
Potassium: 3.9 mEq/L (ref 3.5–5.3)
Sodium: 141 mEq/L (ref 135–145)

## 2010-10-19 ENCOUNTER — Emergency Department (HOSPITAL_COMMUNITY): Payer: PRIVATE HEALTH INSURANCE

## 2010-10-19 ENCOUNTER — Observation Stay (HOSPITAL_COMMUNITY)
Admission: EM | Admit: 2010-10-19 | Discharge: 2010-10-21 | DRG: 313 | Disposition: A | Discharge status: Home / Self Care | Payer: PRIVATE HEALTH INSURANCE | Attending: Internal Medicine | Admitting: Internal Medicine

## 2010-10-19 DIAGNOSIS — D649 Anemia, unspecified: Secondary | ICD-10-CM | POA: Insufficient documentation

## 2010-10-19 DIAGNOSIS — R52 Pain, unspecified: Secondary | ICD-10-CM | POA: Insufficient documentation

## 2010-10-19 DIAGNOSIS — Z7982 Long term (current) use of aspirin: Secondary | ICD-10-CM

## 2010-10-19 DIAGNOSIS — E119 Type 2 diabetes mellitus without complications: Secondary | ICD-10-CM | POA: Diagnosis present

## 2010-10-19 DIAGNOSIS — K219 Gastro-esophageal reflux disease without esophagitis: Secondary | ICD-10-CM | POA: Insufficient documentation

## 2010-10-19 DIAGNOSIS — M899 Disorder of bone, unspecified: Secondary | ICD-10-CM | POA: Diagnosis present

## 2010-10-19 DIAGNOSIS — R079 Chest pain, unspecified: Secondary | ICD-10-CM

## 2010-10-19 DIAGNOSIS — IMO0001 Reserved for inherently not codable concepts without codable children: Secondary | ICD-10-CM | POA: Diagnosis present

## 2010-10-19 DIAGNOSIS — Z794 Long term (current) use of insulin: Secondary | ICD-10-CM

## 2010-10-19 DIAGNOSIS — T4595XA Adverse effect of unspecified primarily systemic and hematological agent, initial encounter: Secondary | ICD-10-CM | POA: Diagnosis present

## 2010-10-19 DIAGNOSIS — M949 Disorder of cartilage, unspecified: Secondary | ICD-10-CM | POA: Diagnosis present

## 2010-10-19 DIAGNOSIS — R0789 Other chest pain: Principal | ICD-10-CM | POA: Diagnosis present

## 2010-10-19 DIAGNOSIS — C50919 Malignant neoplasm of unspecified site of unspecified female breast: Secondary | ICD-10-CM | POA: Diagnosis present

## 2010-10-19 DIAGNOSIS — I10 Essential (primary) hypertension: Secondary | ICD-10-CM | POA: Diagnosis present

## 2010-10-19 DIAGNOSIS — Z79899 Other long term (current) drug therapy: Secondary | ICD-10-CM

## 2010-10-19 DIAGNOSIS — J029 Acute pharyngitis, unspecified: Secondary | ICD-10-CM | POA: Diagnosis not present

## 2010-10-19 DIAGNOSIS — E669 Obesity, unspecified: Secondary | ICD-10-CM | POA: Diagnosis present

## 2010-10-19 DIAGNOSIS — E785 Hyperlipidemia, unspecified: Secondary | ICD-10-CM | POA: Diagnosis present

## 2010-10-19 LAB — CARDIAC PANEL(CRET KIN+CKTOT+MB+TROPI)
Relative Index: INVALID (ref 0.0–2.5)
Total CK: 42 U/L (ref 7–177)
Troponin I: 0.03 ng/mL (ref 0.00–0.06)

## 2010-10-19 LAB — TSH: TSH: 1.901 u[IU]/mL (ref 0.350–4.500)

## 2010-10-19 LAB — D-DIMER, QUANTITATIVE: D-Dimer, Quant: 2.72 ug/mL-FEU — ABNORMAL HIGH (ref 0.00–0.48)

## 2010-10-19 LAB — POCT I-STAT, CHEM 8
Chloride: 101 mEq/L (ref 96–112)
HCT: 28 % — ABNORMAL LOW (ref 36.0–46.0)
Potassium: 4 mEq/L (ref 3.5–5.1)

## 2010-10-19 LAB — CBC
Hemoglobin: 8.9 g/dL — ABNORMAL LOW (ref 12.0–15.0)
RBC: 3.19 MIL/uL — ABNORMAL LOW (ref 3.87–5.11)

## 2010-10-19 LAB — GLUCOSE, CAPILLARY
Glucose-Capillary: 158 mg/dL — ABNORMAL HIGH (ref 70–99)
Glucose-Capillary: 243 mg/dL — ABNORMAL HIGH (ref 70–99)
Glucose-Capillary: 220 mg/dL — ABNORMAL HIGH (ref 70–99)

## 2010-10-19 LAB — DIFFERENTIAL
Basophils Absolute: 0.1 10*3/uL (ref 0.0–0.1)
Lymphocytes Relative: 44 % (ref 12–46)
Monocytes Relative: 14 % — ABNORMAL HIGH (ref 3–12)
Neutrophils Relative %: 39 % — ABNORMAL LOW (ref 43–77)

## 2010-10-19 LAB — HEMOGLOBIN A1C
Hgb A1c MFr Bld: 10.3 % — ABNORMAL HIGH (ref <5.7)
Mean Plasma Glucose: 249 mg/dL — ABNORMAL HIGH (ref <117)

## 2010-10-19 LAB — CK TOTAL AND CKMB (NOT AT ARMC)
CK, MB: 1.8 ng/mL (ref 0.3–4.0)
Relative Index: INVALID (ref 0.0–2.5)
Total CK: 49 U/L (ref 7–177)

## 2010-10-19 LAB — TROPONIN I: Troponin I: 0.01 ng/mL (ref 0.00–0.06)

## 2010-10-19 LAB — POCT CARDIAC MARKERS

## 2010-10-19 MED ORDER — IOHEXOL 300 MG/ML  SOLN
100.0000 mL | Freq: Once | INTRAMUSCULAR | Status: AC | PRN
  Administered 2010-10-19: 100 mL via INTRAVENOUS

## 2010-10-20 ENCOUNTER — Inpatient Hospital Stay (HOSPITAL_COMMUNITY): Payer: PRIVATE HEALTH INSURANCE

## 2010-10-20 DIAGNOSIS — R079 Chest pain, unspecified: Secondary | ICD-10-CM

## 2010-10-20 LAB — LIPID PANEL
Cholesterol: 224 mg/dL — ABNORMAL HIGH (ref 0–200)
HDL: 29 mg/dL — ABNORMAL LOW (ref >39)
LDL Cholesterol: 152 mg/dL — ABNORMAL HIGH (ref 0–99)
Total CHOL/HDL Ratio: 7.7 RATIO
Triglycerides: 216 mg/dL — ABNORMAL HIGH (ref <150)
VLDL: 43 mg/dL — ABNORMAL HIGH (ref 0–40)

## 2010-10-20 LAB — COMPREHENSIVE METABOLIC PANEL
ALT: 25 U/L (ref 0–35)
AST: 18 U/L (ref 0–37)
Albumin: 2.6 g/dL — ABNORMAL LOW (ref 3.5–5.2)
Alkaline Phosphatase: 86 U/L (ref 39–117)
BUN: 14 mg/dL (ref 6–23)
CO2: 31 mEq/L (ref 19–32)
Calcium: 8.5 mg/dL (ref 8.4–10.5)
Chloride: 98 mEq/L (ref 96–112)
Creatinine, Ser: 0.94 mg/dL (ref 0.4–1.2)
GFR calc Af Amer: >60 mL/min (ref >60)
GFR calc non Af Amer: >60 mL/min (ref >60)
Glucose, Bld: 195 mg/dL — ABNORMAL HIGH (ref 70–99)
Potassium: 3.8 mEq/L (ref 3.5–5.1)
Sodium: 137 mEq/L (ref 135–145)
Total Bilirubin: 0.2 mg/dL — ABNORMAL LOW (ref 0.3–1.2)
Total Protein: 5.3 g/dL — ABNORMAL LOW (ref 6.0–8.3)

## 2010-10-20 LAB — CBC
HCT: 26.6 % — ABNORMAL LOW (ref 36.0–46.0)
Hemoglobin: 8.9 g/dL — ABNORMAL LOW (ref 12.0–15.0)
MCH: 27.9 pg (ref 26.0–34.0)
MCHC: 33.5 g/dL (ref 30.0–36.0)
MCV: 83.4 fL (ref 78.0–100.0)
Platelets: 184 10*3/uL (ref 150–400)
RBC: 3.19 MIL/uL — ABNORMAL LOW (ref 3.87–5.11)
RDW: 14.6 % (ref 11.5–15.5)
WBC: 10.4 10*3/uL (ref 4.0–10.5)

## 2010-10-20 LAB — DIFFERENTIAL
Basophils Absolute: 0.1 10*3/uL (ref 0.0–0.1)
Eosinophils Absolute: 0.1 10*3/uL (ref 0.0–0.7)
Eosinophils Relative: 1 % (ref 0–5)
Monocytes Absolute: 1.3 10*3/uL — ABNORMAL HIGH (ref 0.1–1.0)

## 2010-10-20 LAB — URINE MICROSCOPIC-ADD ON

## 2010-10-20 LAB — URINALYSIS, ROUTINE W REFLEX MICROSCOPIC
Bilirubin Urine: NEGATIVE
Glucose, UA: 100 mg/dL — AB
Hgb urine dipstick: NEGATIVE
Ketones, ur: NEGATIVE mg/dL
Leukocytes, UA: NEGATIVE
Nitrite: NEGATIVE
Protein, ur: >300 mg/dL — AB
Specific Gravity, Urine: 1.02 (ref 1.005–1.030)
Urobilinogen, UA: 0.2 mg/dL (ref 0.0–1.0)
pH: 8 (ref 5.0–8.0)

## 2010-10-20 LAB — PHOSPHORUS: Phosphorus: 3.8 mg/dL (ref 2.3–4.6)

## 2010-10-20 LAB — GLUCOSE, CAPILLARY: Glucose-Capillary: 377 mg/dL — ABNORMAL HIGH (ref 70–99)

## 2010-10-20 LAB — MAGNESIUM: Magnesium: 1.8 mg/dL (ref 1.5–2.5)

## 2010-10-20 MED ORDER — TECHNETIUM TC 99M TETROFOSMIN IV KIT
30.0000 | PACK | Freq: Once | INTRAVENOUS | Status: AC | PRN
  Administered 2010-10-20: 30 via INTRAVENOUS

## 2010-10-20 MED ORDER — TECHNETIUM TC 99M TETROFOSMIN IV KIT
10.0000 | PACK | Freq: Once | INTRAVENOUS | Status: AC | PRN
  Administered 2010-10-20: 10 via INTRAVENOUS

## 2010-10-21 ENCOUNTER — Other Ambulatory Visit (HOSPITAL_COMMUNITY): Payer: PRIVATE HEALTH INSURANCE

## 2010-10-21 LAB — URINE CULTURE
Colony Count: 2000
Culture  Setup Time: 201204011756

## 2010-10-25 ENCOUNTER — Other Ambulatory Visit: Payer: Self-pay | Admitting: Oncology

## 2010-10-25 ENCOUNTER — Encounter (HOSPITAL_BASED_OUTPATIENT_CLINIC_OR_DEPARTMENT_OTHER): Payer: PRIVATE HEALTH INSURANCE | Admitting: Oncology

## 2010-10-25 DIAGNOSIS — C50919 Malignant neoplasm of unspecified site of unspecified female breast: Secondary | ICD-10-CM

## 2010-10-25 DIAGNOSIS — Z5111 Encounter for antineoplastic chemotherapy: Secondary | ICD-10-CM

## 2010-10-25 DIAGNOSIS — R509 Fever, unspecified: Secondary | ICD-10-CM

## 2010-10-25 DIAGNOSIS — N644 Mastodynia: Secondary | ICD-10-CM

## 2010-10-25 LAB — COMPREHENSIVE METABOLIC PANEL
ALT: 23 U/L (ref 0–35)
AST: 18 U/L (ref 0–37)
Alkaline Phosphatase: 109 U/L (ref 39–117)
CO2: 27 mEq/L (ref 19–32)
Sodium: 135 mEq/L (ref 135–145)
Total Bilirubin: 0.2 mg/dL — ABNORMAL LOW (ref 0.3–1.2)
Total Protein: 6.2 g/dL (ref 6.0–8.3)

## 2010-10-25 LAB — CBC WITH DIFFERENTIAL/PLATELET
BASO%: 0.6 % (ref 0.0–2.0)
EOS%: 0.4 % (ref 0.0–7.0)
LYMPH%: 24.5 % (ref 14.0–49.7)
MCHC: 32.8 g/dL (ref 31.5–36.0)
MONO#: 0.9 10*3/uL (ref 0.1–0.9)
MONO%: 9.4 % (ref 0.0–14.0)
Platelets: 209 10*3/uL (ref 145–400)
RBC: 3.57 10*6/uL — ABNORMAL LOW (ref 3.70–5.45)
WBC: 9.1 10*3/uL (ref 3.9–10.3)

## 2010-10-26 ENCOUNTER — Encounter: Payer: PRIVATE HEALTH INSURANCE | Admitting: Oncology

## 2010-10-26 DIAGNOSIS — Z5189 Encounter for other specified aftercare: Secondary | ICD-10-CM

## 2010-10-27 LAB — URINALYSIS, ROUTINE W REFLEX MICROSCOPIC
Leukocytes, UA: NEGATIVE
Nitrite: NEGATIVE
Specific Gravity, Urine: 1.014 (ref 1.005–1.030)
Urobilinogen, UA: 0.2 mg/dL (ref 0.0–1.0)
pH: 7.5 (ref 5.0–8.0)

## 2010-10-27 LAB — POCT CARDIAC MARKERS
CKMB, poc: 3 ng/mL (ref 1.0–8.0)
Troponin i, poc: <0.05 ng/mL (ref 0.00–0.09)

## 2010-10-27 LAB — POCT I-STAT, CHEM 8
Calcium, Ion: 1.21 mmol/L (ref 1.12–1.32)
Creatinine, Ser: 1 mg/dL (ref 0.4–1.2)
Glucose, Bld: 138 mg/dL — ABNORMAL HIGH (ref 70–99)
Hemoglobin: 11.6 g/dL — ABNORMAL LOW (ref 12.0–15.0)
TCO2: 26 mmol/L (ref 0–100)

## 2010-10-27 LAB — URINE MICROSCOPIC-ADD ON

## 2010-10-30 NOTE — H&P (Signed)
NAMEMALAIJA, [name]              ACCOUNT NO.:  [id number]  MEDICAL RECORD NO.:  [id number]           PATIENT TYPE:  E  LOCATION:  [facility]                         FACILITY:  [facility]  PHYSICIAN:  Farris Has, MDDATE OF BIRTH:  06/17/1951  DATE OF ADMISSION:  10/19/2010 DATE OF DISCHARGE:                             HISTORY & PHYSICAL   PRIMARY CARE PROVIDER:  Berneta Sages, MD  ONCOLOGIST:  Sherryl Manges, MD  CHIEF COMPLAINT:  Chest pain and overall body aches.  The patient is a 60 year old female with history of breast cancer, currently on chemotherapy per her report.  She does not quite know what the name of the chemotherapy is.  Her last chemo was on 23rd.  She also has history of diabetes and hypertension which is poorly controlled.  The patient states that for the past couple of days she had been having chest pains which are throbbing like, sometimes lasting between 3-8 hours.  They were relieved by nitroglycerin.  She also has had associated back pains throughout entire spine and some pains in her knees bilaterally as well.  She denies any shortness of breath.  She does have occasional nausea but attributes this mainly to her chemotherapy.  She is currently chest pain free.  She did not have any diaphoresis associated with her chest pain.  This was nonexertional.  REVIEW OF SYSTEMS:  No fevers, no chills.  No pleuritic chest pain.  No leg swelling.  No vomiting.  No constipation.  No diarrhea.  Otherwise, review of systems is negative.  PAST MEDICAL HISTORY:  Significant for: 1. Diabetes mellitus, type 2, on Novolin 70/30 sliding scale. 2. Hypertension. 3. History of breast cancer, currently undergoing chemotherapy with     unknown agent. 4. Hyperlipidemia. 5. Cervical radiculopathy. 6. Mood disorder. 7. GERD.  PAST SURGICAL HISTORY:  The patient is status post hysterectomy in 1996 and status post a surgical excision of invasive ductal carcinoma with lymph nodes  resection.  FAMILY HISTORY:  Significant for diabetes and hypertension.  She also has a brother with end-stage renal disease.  SOCIAL HISTORY:  Does not smoke or drink or abuse drugs.  ALLERGIES:  None.  MEDICATIONS: 1. Lasix, she says is 80 mg b.i.d. currently. 2. Multivitamin. 3. Metformin 500 mg daily. 4. Humalog 70/30 according to sliding scale, she cannot quite     recollect. 5. Oxycodone/acetaminophen as needed for pain. 6. Ondansetron 8 mg a day for nausea. 7. Lidocaine gel as needed for aches and pain. 8. Iron supplement.  PHYSICAL EXAMINATION:  VITAL SIGNS:  Temperature 99.7, blood pressure 143/88, pulse 92, respirations 20, satting 10% on room air. GENERAL:  The patient appears to be currently resting, in no acute distress. HEENT:  Head:  Nontraumatic.  Moist mucous membranes. LUNGS:  Clear to auscultation bilaterally although somewhat distant. HEART:  Regular rate and rhythm.  No murmurs appreciated. ABDOMEN:  Soft, nontender, nondistended. EXTREMITIES:  Lower extremities without clubbing, cyanosis, or edema. She does state that there is some generalized back tenderness to palpation throughout her whole back.  No chest wall tenderness to palpation. SKIN:  Clean, dry, and intact. NEUROLOGIC:  Grossly intact.  LABORATORY DATA:  White blood cell count 5.0, hemoglobin 9.5.  Sodium 139, potassium 4.0, creatinine 1.2.  Cardiac enzymes unremarkable.  D- dimer is 2.72.  CT scan of her chest negative for PE. EKG showing heart rate of 97, there is no change from prior.  ASSESSMENT AND PLAN:  This is a 60 year old female with known history of breast cancer, currently undergoing chemotherapy, presents with atypical chest pain and also history of body aches. 1. Chest pain.  This is fairly atypical, but given her history of     diabetes and hypertension, she does have risk factors.  We will     admit and cycle cardiac markers, check a hemoglobin A1c to see how     bad is  her diabetes, check fasting lipid panel.  We will hold off     on ordering 2-D echo as she has had a recent echo done in January     2012, which showed an ejection fraction of 65-70%.  No regional     wall motion abnormalities, and there was grade 1 diastolic     dysfunction. 2. Body aches, unknown etiology.  She is currently not showing any     symptoms of viral infection or upper respiratory illness.  This is     possible it may be related to her chemotherapy.  We would need to     consult with her oncologist regarding that.  At this point, they     have completely resolved.  Her electrolytes are normal, but we will     check magnesium level. 3. History of hypertension.  Continue lisinopril and Lasix. 4. Diabetes mellitus.  We will switch her to Lantus while she is here.     We need to clarify what kind of sliding scale she own actually and     for right now, we will cover her in-house coverage according to her     sliding scale. 5. Prophylaxis.  Protonix and Lovenox.     Farris Has, MD     AVD/MEDQ  D:  10/19/2010  T:  10/19/2010  Job:  [id number]  cc:   Berneta Sages, M.D. Sherryl Manges, M.D.  Electronically Signed by Toy Baker MD on 10/30/2010 08:41:33 PM

## 2010-11-01 ENCOUNTER — Other Ambulatory Visit: Payer: Self-pay | Admitting: Oncology

## 2010-11-01 ENCOUNTER — Encounter (HOSPITAL_BASED_OUTPATIENT_CLINIC_OR_DEPARTMENT_OTHER): Payer: PRIVATE HEALTH INSURANCE | Admitting: Oncology

## 2010-11-01 DIAGNOSIS — E118 Type 2 diabetes mellitus with unspecified complications: Secondary | ICD-10-CM

## 2010-11-01 DIAGNOSIS — C50919 Malignant neoplasm of unspecified site of unspecified female breast: Secondary | ICD-10-CM

## 2010-11-01 DIAGNOSIS — R5381 Other malaise: Secondary | ICD-10-CM

## 2010-11-01 DIAGNOSIS — R509 Fever, unspecified: Secondary | ICD-10-CM

## 2010-11-01 DIAGNOSIS — R5383 Other fatigue: Secondary | ICD-10-CM

## 2010-11-01 DIAGNOSIS — N644 Mastodynia: Secondary | ICD-10-CM

## 2010-11-01 DIAGNOSIS — D709 Neutropenia, unspecified: Secondary | ICD-10-CM

## 2010-11-01 DIAGNOSIS — E1165 Type 2 diabetes mellitus with hyperglycemia: Secondary | ICD-10-CM

## 2010-11-01 DIAGNOSIS — C50419 Malignant neoplasm of upper-outer quadrant of unspecified female breast: Secondary | ICD-10-CM

## 2010-11-01 LAB — BASIC METABOLIC PANEL
BUN: 23 mg/dL (ref 6–23)
CO2: 30 mEq/L (ref 19–32)
Calcium: 8.3 mg/dL — ABNORMAL LOW (ref 8.4–10.5)
Creatinine, Ser: 1.11 mg/dL (ref 0.40–1.20)
Glucose, Bld: 386 mg/dL — ABNORMAL HIGH (ref 70–99)
Sodium: 135 mEq/L (ref 135–145)

## 2010-11-01 LAB — CBC WITH DIFFERENTIAL/PLATELET
Basophils Absolute: 0.1 10*3/uL (ref 0.0–0.1)
EOS%: 1.4 % (ref 0.0–7.0)
Eosinophils Absolute: 0 10*3/uL (ref 0.0–0.5)
HGB: 8.6 g/dL — ABNORMAL LOW (ref 11.6–15.9)
LYMPH%: 54 % — ABNORMAL HIGH (ref 14.0–49.7)
MCH: 27 pg (ref 25.1–34.0)
MCV: 83.6 fL (ref 79.5–101.0)
MONO%: 16.1 % — ABNORMAL HIGH (ref 0.0–14.0)
Platelets: 290 10*3/uL (ref 145–400)
RDW: 15.1 % — ABNORMAL HIGH (ref 11.2–14.5)

## 2010-11-04 NOTE — Consult Note (Signed)
[name], [name]              ACCOUNT NO.:  [id number]  MEDICAL RECORD NO.:  [id number]           PATIENT TYPE:  I  LOCATION:  2023                         FACILITY:  [facility]  PHYSICIAN:  Thompson Grayer, MD       DATE OF BIRTH:  03/21/1951  DATE OF CONSULTATION:  10/19/2010 DATE OF DISCHARGE:                                CONSULTATION   REQUESTING PHYSICIAN:  Hospitalist, Dr. Roel Cluck.  REASON FOR CONSULTATION:  Chest pain.  HISTORY OF PRESENT ILLNESS:  Ms. [name] is a pleasant 60 year old African American female with a history of morbid obesity, diabetes, hypertension, and recent initiation of chemotherapy for breast cancer who now presents with myalgias and chest pain.  The patient reports being in her usual state of health recently.  She was diagnosed with breast cancer and was initiated on chemotherapy on October 11, 2010.  She is unaware as to which chemotherapy medicine regimen she is taking.  She states that yesterday evening she developed a gradual onset of a "throbbing" sensation along her shoulders, arms, back, lower back, thighs, and also into her chest.  The symptoms persisted for several hours and she therefore presented to [facility] for further evaluation. She denies palpitations, associated shortness of breath, orthopnea, PND, diaphoresis, or vomiting.  She did have mild nausea with the episode. She denies any recent episodes of exertional pain.  Overnight, she was treated supportively and presently she is resting comfortably without complaint.  PAST MEDICAL HISTORY: 1. Status post left heart catheterization in 2005 by Dr. Mertie Moores     or which revealed smooth and normal coronaries with a normal left     ventricular ejection fraction. 2. Obesity. 3. Diabetes. 4. Hypertension. 5. Hyperlipidemia. 6. Breast cancer with chemotherapy treatment ongoing. 7. Hyperlipidemia. 8. Cervical radiculopathy. 9. GERD.  SURGICAL HISTORY:  Status post hysterectomy in  1996, status post surgical excision of an invasive ductal carcinoma with lymph node resection.  MEDICATIONS:  Reviewed in the [facility].  ALLERGIES:  None.  SOCIAL HISTORY:  The patient denies tobacco, alcohol, or drug use.  FAMILY HISTORY:  Notable for diabetes and hypertension.  REVIEW OF SYSTEMS:  All systems are reviewed and are negative except as outlined in the HPI above.  PHYSICAL EXAMINATION:  Telemetry reveals sinus rhythm. VITAL SIGNS:  Blood pressure 137/76, heart rate 79, respirations 16, saturations 99% on 2 liters, and afebrile. GENERAL:  The patient is a pleasant, morbidly obese female in no acute distress.  She is alert and oriented x3. HEENT:  Normocephalic and atraumatic.  Sclerae anicteric.  Conjunctivae pink.  Oropharynx clear. NECK:  Supple.  No JVD, lymphadenopathy, or bruits. LUNGS:  Clear to auscultation bilaterally. HEART:  Regular rate and rhythm.  No murmurs, rubs, or gallops. GI:  Soft, nontender, and nondistended.  Positive bowel sounds. EXTREMITIES:  No clubbing, cyanosis, or edema. SKIN:  No ecchymoses or lacerations. MUSCULOSKELETAL:  No deformity or atrophy. PSYCHIATRIC:  Euthymic mood.  Full affect. CHEST WALL:  She has a Port-A-Cath in place.  EKG reveals sinus rhythm at 80 beats per minute.  There are no ischemic changes and the EKG is  otherwise normal.  LABORATORY DATA:  Cardiac markers are negative.  TSH 1.9.  Hemoglobin A1c 10.3.  Total cholesterol 232, triglycerides 174, LDL 158, and HDL 39.  Hematocrit 26.8, white blood cell count 5, platelets 192. Creatinine 1.2 and potassium 4.0.  CT angiogram of the chest from March 31 reveals no evidence of pulmonary emboli.  The thoracic aorta appears to be without significant abnormality.  IMPRESSION:  Ms. [name] is a pleasant 60 year old female with multiple coronary artery disease risk factors including diabetes which is poorly controlled, hypertension, hyperlipidemia, and obesity.  She  presents with chest pain and myalgias.  Though her chest discomfort is mostly atypical in presentation and her EKG is normal, I am concerned that she does have multiple coronary artery disease risk factors.  Her cath in 2005, however, was completely normal.  At this time, I think the most prudent strategy would be to further risk stratify the patient with a Lexiscan Myoview.  We will therefore plan for a Myoview at the next available time.  She has been appropriately initiated on Crestor and I would also recommend that we continue aspirin.  She needs close glycemic control as her hemoglobin A1c suggest that her blood sugars are presently not well controlled.  We will follow the patient with you during her hospital stay.     Thompson Grayer, MD     JA/MEDQ  D:  10/19/2010  T:  10/20/2010  Job:  [id number]  cc:   Farris Has, MD  Electronically Signed by Thompson Grayer MD on 11/04/2010 09:21:01 AM

## 2010-11-07 ENCOUNTER — Other Ambulatory Visit: Payer: Self-pay | Admitting: Oncology

## 2010-11-07 ENCOUNTER — Encounter (HOSPITAL_BASED_OUTPATIENT_CLINIC_OR_DEPARTMENT_OTHER): Payer: PRIVATE HEALTH INSURANCE | Admitting: Oncology

## 2010-11-07 DIAGNOSIS — Z171 Estrogen receptor negative status [ER-]: Secondary | ICD-10-CM

## 2010-11-07 DIAGNOSIS — E119 Type 2 diabetes mellitus without complications: Secondary | ICD-10-CM

## 2010-11-07 DIAGNOSIS — C50219 Malignant neoplasm of upper-inner quadrant of unspecified female breast: Secondary | ICD-10-CM

## 2010-11-07 DIAGNOSIS — Z794 Long term (current) use of insulin: Secondary | ICD-10-CM

## 2010-11-07 DIAGNOSIS — N644 Mastodynia: Secondary | ICD-10-CM

## 2010-11-07 DIAGNOSIS — C50919 Malignant neoplasm of unspecified site of unspecified female breast: Secondary | ICD-10-CM

## 2010-11-07 DIAGNOSIS — R509 Fever, unspecified: Secondary | ICD-10-CM

## 2010-11-07 LAB — CBC WITH DIFFERENTIAL/PLATELET
BASO%: 0.2 % (ref 0.0–2.0)
EOS%: 0.2 % (ref 0.0–7.0)
HCT: 27.7 % — ABNORMAL LOW (ref 34.8–46.6)
MCH: 29.1 pg (ref 25.1–34.0)
MCHC: 34.1 g/dL (ref 31.5–36.0)
MONO#: 0.8 10*3/uL (ref 0.1–0.9)
NEUT%: 85 % — ABNORMAL HIGH (ref 38.4–76.8)
RBC: 3.24 10*6/uL — ABNORMAL LOW (ref 3.70–5.45)
RDW: 15.4 % — ABNORMAL HIGH (ref 11.2–14.5)
WBC: 19.8 10*3/uL — ABNORMAL HIGH (ref 3.9–10.3)
lymph#: 2.1 10*3/uL (ref 0.9–3.3)

## 2010-11-07 LAB — COMPREHENSIVE METABOLIC PANEL
ALT: 21 U/L (ref 0–35)
AST: 19 U/L (ref 0–37)
CO2: 28 mEq/L (ref 19–32)
Calcium: 9.2 mg/dL (ref 8.4–10.5)
Chloride: 101 mEq/L (ref 96–112)
Creatinine, Ser: 1.13 mg/dL (ref 0.40–1.20)
Potassium: 4 mEq/L (ref 3.5–5.3)
Sodium: 142 mEq/L (ref 135–145)
Total Protein: 6.6 g/dL (ref 6.0–8.3)

## 2010-11-08 ENCOUNTER — Encounter (HOSPITAL_BASED_OUTPATIENT_CLINIC_OR_DEPARTMENT_OTHER): Payer: PRIVATE HEALTH INSURANCE | Admitting: Oncology

## 2010-11-08 DIAGNOSIS — Z5111 Encounter for antineoplastic chemotherapy: Secondary | ICD-10-CM

## 2010-11-08 DIAGNOSIS — C50919 Malignant neoplasm of unspecified site of unspecified female breast: Secondary | ICD-10-CM

## 2010-11-09 ENCOUNTER — Encounter (HOSPITAL_BASED_OUTPATIENT_CLINIC_OR_DEPARTMENT_OTHER): Payer: PRIVATE HEALTH INSURANCE | Admitting: Oncology

## 2010-11-09 DIAGNOSIS — Z5189 Encounter for other specified aftercare: Secondary | ICD-10-CM

## 2010-11-09 DIAGNOSIS — C50919 Malignant neoplasm of unspecified site of unspecified female breast: Secondary | ICD-10-CM

## 2010-11-11 NOTE — Discharge Summary (Signed)
NAMEAFRA, CRAGUN              ACCOUNT NO.:  [id number]  MEDICAL RECORD NO.:  [id number]           PATIENT TYPE:  I  LOCATION:  2023                         FACILITY:  [facility]  PHYSICIAN:  Domenic Polite, MD     DATE OF BIRTH:  1950-11-29  DATE OF ADMISSION:  10/19/2010 DATE OF DISCHARGE:                              DISCHARGE SUMMARY   PRIMARY CARE PHYSICIAN:  Berneta Sages, MD  ONCOLOGIST:  Marcy Panning, MD  DISCHARGE DIAGNOSES: 1. Atypical chest pain, improving Myoview negative. 2. Generalized muscle and bone pain felt to be secondary to bone     marrow expansion from [place], improving. 3. Stage I ER/PR, Her-2 negative breast cancer, status post lumpectomy     in January followed by radiation and now on recently started on     chemotherapy. 4. Acute pharyngitis. 5. Type 2 diabetes. 6. Hypertension. 7. Dyslipidemia. 8. History of cervical radiculopathy. 9. History of mood disorder. 10.Gastroesophageal reflux disease.  DISCHARGE MEDICATIONS:  Are as follows: 1. Amoxicillin 500 mg p.o. b.i.d. for 4 days. 2. Vicodin 5/325 one tablet q.4 h p.r.n. 3. Crestor 10 mg daily. 4. Aspirin 81 mg daily. 5. Dexamethasone 4 mg 2 tablets p.o. b.i.d. for 3 days after chemo. 6. EMLA to port 45 minutes before chemo. 7. Furosemide 80 mg p.o. b.i.d. 8. Humulin 70/30 sliding scale 3 times a day. 9. Iron 65 mg daily. 10.Lisinopril 40 mg daily. 11.Metformin 500 mg daily. 12.Multivitamin 1 tablet daily. 13.Omeprazole 20 mg daily. 14.Zofran 8 mg q.8 h p.r.n. 15.Prochlorperazine 10 mg p.o. q.6 h p.r.n. 16.Systane eyedrops 1 drop in both eyes daily p.r.n. 17.Tylenol Extra Strength 500 mg p.o. q.6 h p.r.n. 18.Tylox (oxycodone/acetaminophen) 5/500 one to two capsules p.o. q.4     h p.r.n. for pain.  CONSULTANTS: 1. Thompson Grayer, MD, Cardiology. Barronett Beryle Beams, M.D., F.A.C.P. Medical Oncology.  DIAGNOSTIC INVESTIGATIONS: 1. Chest x-ray February 31, 2012 shows low lung volume,  vascular     crowding, streaky bibasilar atelectasis. 2. CT angio of the chest February 31, 2012 showed no evidence of acute     pulmonary embolism, normal thoracic aorta except for minimal     atherosclerotic changes.  There is atelectasis but no infiltrates,     edema, or effusion, radiation changes involving the left breast and     borderline mediastinal hilar lymph nodes. 3. Myoview October 20, 2010, shows negative for inducible ischemia with     pharmacological stress 66% ejection fraction.  HOSPITAL COURSE: 1. Ms. Corea is a  60 year old female with history of breast cancer,     currently on chemotherapy presented to the hospital with 1 day     history of throbbing pain in her chest, neck, shoulder, arms, etc.     Due to her multiple cardiac risk factors, she was admitted for     observation.  Initially as part of her workup ruled out to be EN     because she had multiple cardiac risk factors and underwent a     stress Myoview, which was negative.  Subsequently, she was seen by     Oncology and felt  that her pain was most likely secondary to bone     marrow extension from [place] that she got about less than a week     ago. 2. In the hospital, she was also complaining of sore throat noted to     have pharyngitis based on erythema of her oropharynx with tender     subclavian lymphadenopathy, started on p.o. amoxicillin with good     clinical response which she will require for more days. 3. Type 2 diabetes.  Continued on insulin sliding scale. 4. Discharge condition is stable.  Vital signs at discharge     temperature 97.5, pulse rate 90, blood pressure 122/82,     respirations 18, satting 96% room air.  DISCHARGE FOLLOWUP: 1. Primary physician, Dr. Dagmar Hait in 7-10 days. 2. Dr. Humphrey Rolls in 1 week.  The patient is due for a chemo later during     the week.     Domenic Polite, MD     PJ/MEDQ  D:  10/21/2010  T:  10/21/2010  Job:  [id number]  cc:   Berneta Sages, M.D. Marcy Panning,  M.D.  Electronically Signed by Domenic Polite  on 11/11/2010 07:48:56 PM

## 2010-11-15 ENCOUNTER — Encounter (HOSPITAL_BASED_OUTPATIENT_CLINIC_OR_DEPARTMENT_OTHER): Payer: PRIVATE HEALTH INSURANCE | Admitting: Oncology

## 2010-11-15 ENCOUNTER — Other Ambulatory Visit: Payer: Self-pay | Admitting: Oncology

## 2010-11-15 DIAGNOSIS — C50919 Malignant neoplasm of unspecified site of unspecified female breast: Secondary | ICD-10-CM

## 2010-11-15 DIAGNOSIS — C50419 Malignant neoplasm of upper-outer quadrant of unspecified female breast: Secondary | ICD-10-CM

## 2010-11-15 DIAGNOSIS — Z171 Estrogen receptor negative status [ER-]: Secondary | ICD-10-CM

## 2010-11-15 DIAGNOSIS — N644 Mastodynia: Secondary | ICD-10-CM

## 2010-11-15 DIAGNOSIS — R509 Fever, unspecified: Secondary | ICD-10-CM

## 2010-11-15 DIAGNOSIS — D649 Anemia, unspecified: Secondary | ICD-10-CM

## 2010-11-15 LAB — BASIC METABOLIC PANEL
Calcium: 8.9 mg/dL (ref 8.4–10.5)
Chloride: 107 mEq/L (ref 96–112)
Creatinine, Ser: 0.87 mg/dL (ref 0.40–1.20)
Sodium: 141 mEq/L (ref 135–145)

## 2010-11-15 LAB — CBC WITH DIFFERENTIAL/PLATELET
BASO%: 0.9 % (ref 0.0–2.0)
EOS%: 0.6 % (ref 0.0–7.0)
HCT: 23.5 % — ABNORMAL LOW (ref 34.8–46.6)
LYMPH%: 27.2 % (ref 14.0–49.7)
MCH: 28.7 pg (ref 25.1–34.0)
MCHC: 33.6 g/dL (ref 31.5–36.0)
NEUT%: 67.1 % (ref 38.4–76.8)
RBC: 2.75 10*6/uL — ABNORMAL LOW (ref 3.70–5.45)
WBC: 4.3 10*3/uL (ref 3.9–10.3)
lymph#: 1.2 10*3/uL (ref 0.9–3.3)

## 2010-11-21 ENCOUNTER — Encounter (HOSPITAL_BASED_OUTPATIENT_CLINIC_OR_DEPARTMENT_OTHER): Payer: PRIVATE HEALTH INSURANCE | Admitting: Oncology

## 2010-11-21 ENCOUNTER — Other Ambulatory Visit: Payer: Self-pay | Admitting: Oncology

## 2010-11-21 DIAGNOSIS — R5383 Other fatigue: Secondary | ICD-10-CM

## 2010-11-21 DIAGNOSIS — R509 Fever, unspecified: Secondary | ICD-10-CM

## 2010-11-21 DIAGNOSIS — C50919 Malignant neoplasm of unspecified site of unspecified female breast: Secondary | ICD-10-CM

## 2010-11-21 DIAGNOSIS — N644 Mastodynia: Secondary | ICD-10-CM

## 2010-11-21 LAB — CBC WITH DIFFERENTIAL/PLATELET
Basophils Absolute: 0 10*3/uL (ref 0.0–0.1)
Eosinophils Absolute: 0.1 10*3/uL (ref 0.0–0.5)
HCT: 24.8 % — ABNORMAL LOW (ref 34.8–46.6)
HGB: 8.2 g/dL — ABNORMAL LOW (ref 11.6–15.9)
MONO#: 1.1 10*3/uL — ABNORMAL HIGH (ref 0.1–0.9)
NEUT#: 13.1 10*3/uL — ABNORMAL HIGH (ref 1.5–6.5)
NEUT%: 82.3 % — ABNORMAL HIGH (ref 38.4–76.8)
RDW: 16.7 % — ABNORMAL HIGH (ref 11.2–14.5)
lymph#: 1.6 10*3/uL (ref 0.9–3.3)

## 2010-11-21 LAB — COMPREHENSIVE METABOLIC PANEL
AST: 19 U/L (ref 0–37)
Albumin: 3.6 g/dL (ref 3.5–5.2)
BUN: 15 mg/dL (ref 6–23)
CO2: 26 mEq/L (ref 19–32)
Calcium: 8.8 mg/dL (ref 8.4–10.5)
Chloride: 103 mEq/L (ref 96–112)
Glucose, Bld: 277 mg/dL — ABNORMAL HIGH (ref 70–99)
Potassium: 4.3 mEq/L (ref 3.5–5.3)

## 2010-11-22 ENCOUNTER — Inpatient Hospital Stay (HOSPITAL_BASED_OUTPATIENT_CLINIC_OR_DEPARTMENT_OTHER): Payer: PRIVATE HEALTH INSURANCE | Admitting: Oncology

## 2010-11-22 DIAGNOSIS — Z5111 Encounter for antineoplastic chemotherapy: Secondary | ICD-10-CM

## 2010-11-22 DIAGNOSIS — C50919 Malignant neoplasm of unspecified site of unspecified female breast: Secondary | ICD-10-CM

## 2010-11-23 ENCOUNTER — Encounter: Payer: PRIVATE HEALTH INSURANCE | Admitting: Oncology

## 2010-11-23 DIAGNOSIS — Z5189 Encounter for other specified aftercare: Secondary | ICD-10-CM

## 2010-11-29 ENCOUNTER — Encounter (HOSPITAL_BASED_OUTPATIENT_CLINIC_OR_DEPARTMENT_OTHER): Payer: PRIVATE HEALTH INSURANCE | Admitting: Oncology

## 2010-11-29 ENCOUNTER — Other Ambulatory Visit: Payer: Self-pay | Admitting: Oncology

## 2010-11-29 DIAGNOSIS — R5381 Other malaise: Secondary | ICD-10-CM

## 2010-11-29 DIAGNOSIS — C50919 Malignant neoplasm of unspecified site of unspecified female breast: Secondary | ICD-10-CM

## 2010-11-29 DIAGNOSIS — N644 Mastodynia: Secondary | ICD-10-CM

## 2010-11-29 DIAGNOSIS — R509 Fever, unspecified: Secondary | ICD-10-CM

## 2010-11-29 DIAGNOSIS — E118 Type 2 diabetes mellitus with unspecified complications: Secondary | ICD-10-CM

## 2010-11-29 LAB — CBC WITH DIFFERENTIAL/PLATELET
Basophils Absolute: 0 10*3/uL (ref 0.0–0.1)
EOS%: 0.9 % (ref 0.0–7.0)
HGB: 7.8 g/dL — ABNORMAL LOW (ref 11.6–15.9)
MCH: 29.3 pg (ref 25.1–34.0)
MONO#: 0.2 10*3/uL (ref 0.1–0.9)
NEUT#: 4.5 10*3/uL (ref 1.5–6.5)
RDW: 17.4 % — ABNORMAL HIGH (ref 11.2–14.5)
WBC: 5.7 10*3/uL (ref 3.9–10.3)
lymph#: 0.9 10*3/uL (ref 0.9–3.3)

## 2010-11-29 LAB — BASIC METABOLIC PANEL
BUN: 20 mg/dL (ref 6–23)
Chloride: 104 mEq/L (ref 96–112)
Potassium: 4.1 mEq/L (ref 3.5–5.3)

## 2010-12-03 ENCOUNTER — Encounter (INDEPENDENT_AMBULATORY_CARE_PROVIDER_SITE_OTHER): Payer: Self-pay | Admitting: General Surgery

## 2010-12-03 NOTE — Op Note (Signed)
[name], [name]              ACCOUNT NO.:  [id number]   MEDICAL RECORD NO.:  [id number]          PATIENT TYPE:  AMB   LOCATION:  SDS                          FACILITY:  [facility]   PHYSICIAN:  Clent Demark. Rankin, M.D.   DATE OF BIRTH:  Apr 14, 1951   DATE OF PROCEDURE:  08/23/2007  DATE OF DISCHARGE:                               OPERATIVE REPORT   PREOPERATIVE DIAGNOSES:  1. Vitreous hemorrhage, right eye.  2. Progressive proliferative diabetic retinopathy, right eye.   POSTOPERATIVE DIAGNOSES:  1. Vitreous hemorrhage, right eye.  2. Progressive proliferative diabetic retinopathy, right eye.  3. Retinal holes, superior quadrant, right eye.   SURGEON:  Clent Demark. Rankin, M.D.   ANESTHESIA:  General endotracheal anesthesia.   PROCEDURE:  Posterior vitrectomy with endolaser pan-photocoagulation,  right eye, #25 gauge.   INDICATIONS:  The patient is a 60 year old woman with dense vitreous  hemorrhage and impairment of visual acuity, treatment naive for laser  photocoagulation and risk of neovascularization progression.  She  understands the attempt to include the visual axis as well as to treat  the underlying cause likely neovascularization.  She understands the  risks of anesthesia, including the rare occurrence of death, but also to  the eye, including but not limited to hemorrhage, infection, scarring,  need for further surgery, no change in vision, loss of vision and  progressive disease despite intervention.  An informed consent was  obtained.   ESTIMATED BLOOD LOSS:  The patient was taken to the operating room.  In  the operating room the appropriate monitoring, followed by general  endotracheal anesthesia.  The right periocular region was sterilely  prepped and draped in the usual ophthalmic fashion.  A lid speculum was  applied.  A #25 gauge trocar was placed in the inferotemporal quadrant.  The superior trocar is applied.  A core vitrectomy was then begun.  Vitreous  opacification was removed.  At this time the vitreous skirt  trimmed 360 degrees and retinal holes found at the 12:30 and the 1:30  positions.  Pre-retinal hemorrhage was also removed.  Laser  photocoagulation was then placed 360 degrees, basically anterior along  the equator, so the regions that appeared to have peripheral advanced  sheeting and peripheral non-perfusion.   Approximately 0000000 applications were applied.  At this time the laser  and the instruments were removed from the eye.  The superior trocar was  removed.  The wounds were secured.  The infusion removed.  Sub-  conjunctival Decadron applied.  The lid speculum removed and a sterile  patch and Fox shield applied to the right eye.   The patient tolerated the procedure without complications.      Clent Demark Rankin, M.D.  Electronically Signed     GAR/MEDQ  D:  08/23/2007  T:  08/23/2007  Job:  [id number]

## 2010-12-05 ENCOUNTER — Encounter (HOSPITAL_BASED_OUTPATIENT_CLINIC_OR_DEPARTMENT_OTHER): Payer: PRIVATE HEALTH INSURANCE | Admitting: Oncology

## 2010-12-05 ENCOUNTER — Other Ambulatory Visit: Payer: Self-pay | Admitting: Oncology

## 2010-12-05 ENCOUNTER — Encounter (HOSPITAL_COMMUNITY)
Admission: RE | Admit: 2010-12-05 | Discharge: 2010-12-05 | Disposition: A | Discharge status: Home / Self Care | Payer: PRIVATE HEALTH INSURANCE | Source: Ambulatory Visit | Attending: Oncology | Admitting: Oncology

## 2010-12-05 DIAGNOSIS — N644 Mastodynia: Secondary | ICD-10-CM

## 2010-12-05 DIAGNOSIS — Z5111 Encounter for antineoplastic chemotherapy: Secondary | ICD-10-CM

## 2010-12-05 DIAGNOSIS — D649 Anemia, unspecified: Secondary | ICD-10-CM

## 2010-12-05 DIAGNOSIS — C50919 Malignant neoplasm of unspecified site of unspecified female breast: Secondary | ICD-10-CM

## 2010-12-05 DIAGNOSIS — R509 Fever, unspecified: Secondary | ICD-10-CM

## 2010-12-05 LAB — ABO/RH: ABO/RH(D): A POS

## 2010-12-05 LAB — CBC WITH DIFFERENTIAL/PLATELET
Basophils Absolute: 0.1 10*3/uL (ref 0.0–0.1)
Eosinophils Absolute: 0.1 10*3/uL (ref 0.0–0.5)
HGB: 7.7 g/dL — ABNORMAL LOW (ref 11.6–15.9)
LYMPH%: 8.8 % — ABNORMAL LOW (ref 14.0–49.7)
MCV: 86.1 fL (ref 79.5–101.0)
MONO%: 13.4 % (ref 0.0–14.0)
NEUT#: 18.3 10*3/uL — ABNORMAL HIGH (ref 1.5–6.5)
Platelets: 273 10*3/uL (ref 145–400)
RBC: 2.81 10*6/uL — ABNORMAL LOW (ref 3.70–5.45)

## 2010-12-05 LAB — BASIC METABOLIC PANEL
Potassium: 4 mEq/L (ref 3.5–5.3)
Sodium: 142 mEq/L (ref 135–145)

## 2010-12-05 LAB — TYPE & CROSSMATCH - CHCC

## 2010-12-06 ENCOUNTER — Encounter (HOSPITAL_BASED_OUTPATIENT_CLINIC_OR_DEPARTMENT_OTHER): Payer: PRIVATE HEALTH INSURANCE | Admitting: Oncology

## 2010-12-06 DIAGNOSIS — C50919 Malignant neoplasm of unspecified site of unspecified female breast: Secondary | ICD-10-CM

## 2010-12-06 DIAGNOSIS — Z5111 Encounter for antineoplastic chemotherapy: Secondary | ICD-10-CM

## 2010-12-06 LAB — CROSSMATCH

## 2010-12-06 NOTE — H&P (Signed)
NAMEGAYLEN, ALBEE              ACCOUNT NO.:  [id number]   MEDICAL RECORD NO.:  [id number]          PATIENT TYPE:  INP   LOCATION:  0104                         FACILITY:  [facility]   PHYSICIAN:  Thayer Headings, M.D. DATE OF BIRTH:  Jul 27, 1950   DATE OF ADMISSION:  06/25/2004  DATE OF DISCHARGE:                                HISTORY & PHYSICAL   REASON FOR ADMISSION:  Ms. Knoblauch is a 60 year old black female with a  history of diabetes mellitus, hypertension, hypercholesterolemia, who is  admitted with episodes of chest pain.   HISTORY OF PRESENT ILLNESS:  The patient has had some episodes of chest pain  for the past several weeks.  She was sent to our office and had a stress  Cardiolite study.  The stress Cardiolite study was negative for ischemia.  She was found to have a normal left ventricular systolic function. For the  next several days, she felt fairly well.  This morning, she developed a  severe headache followed by some chest pain.  The chest pain started with a  fluttering in her chest.  The chest pain was described as a chest pressure  and radiated out to her arms.  It was associated with some diaphoresis and  severe shortness of breath.  She also stated that she had severe nausea.  The patient is much better now that she is in the emergency room.  She did  not require any nitroglycerin.   CURRENT MEDICATIONS:  1.  Novolin 70/30 with 55 units q.a.m. and 45 units q.p.m.  2.  Estratest 0.625 mg q.d.  3.  Avandia 1 q.d.  4.  Amaryl 1 q.d. (doses of these are unknown).  5.  She also apparently has been on either Lasix or hydrochlorothiazide.   ALLERGIES:  VICODIN.   PAST MEDICAL HISTORY:  1.  Diabetes mellitus:  Her diabetes is very poorly controlled.  Her      hemoglobin A1C is around 11.  2.  Hypertension.  3.  Hypercholesterolemia.   SOCIAL HISTORY:  The patient is a nonsmoker.  She is fairly noncompliant  according to her son.  She does not get any exercise.   She eats chocolate  and fried foods almost every day.   FAMILY HISTORY:  Positive for coronary artery disease.   REVIEW OF SYMPTOMS:  Reviewed and is essentially negative.   PHYSICAL EXAMINATION:  GENERAL:  She is a middle-aged female in no acute  distress.  She is alert and oriented x 3 and her mood and affect are normal.  VITAL SIGNS:  Blood pressure is 109/56, heart rate of 71.  NECK:  There are 2+ carotids.  She has no bruits.  There is no JVD and no  thyromegaly.  LUNGS:  Clear to auscultation.  HEART:  Regular rate, S1 and S2.  She has no murmurs, gallops, or rubs.  ABDOMEN:  Good bowel sounds and is nontender.  EXTREMITIES:  She has no clubbing, cyanosis, or edema.  NEUROLOGICAL:  Nonfocal.   LABORATORY DATA:  Her EKG reveals normal sinus rhythm.  She has no ST or T-  wave changes.  Her initial CPK-MB was 10.1 with a follow-up CPK-MB of 5.7.  Both troponin levels are less than 0.05.   IMPRESSION:  Atypical chest pain:  The patient presents with some atypical  chest pains.  The CPKs are elevated but the troponin levels are negative.  She has multiple risk factors for coronary artery disease and is relatively  noncompliant.  I think that she is at very high risk for having coronary  artery disease despite the fact that she had a negative Cardiolite study.  I  anticipate performing heart catheterization tomorrow based on her elevated  CPK-MBs.  It is difficult to tell whether the CPKs will hold more weight or  the troponin levels.  She certainly needs further evaluation because of the  chest pain.  If the catheterization turns out to be relatively benign, then  we may need to consider evaluating her gallbladder or evaluating her peptic  ulcer disease.   We discussed the risks, benefits, and options of heart catheterization.  She  understands and agrees to proceed.      PJN/MEDQ  D:  06/25/2004  T:  06/25/2004  Job:  [id number]   cc:   Berneta Sages, M.D.  [address]   [place]  [place] [address]  Fax: [phone]

## 2010-12-06 NOTE — Discharge Summary (Signed)
[name], [name]              ACCOUNT NO.:  [id number]   MEDICAL RECORD NO.:  [id number]          PATIENT TYPE:  INP   LOCATION:  2011                         FACILITY:  [facility]   PHYSICIAN:  Thayer Headings, M.D. DATE OF BIRTH:  1950-11-27   DATE OF ADMISSION:  06/25/2004  DATE OF DISCHARGE:  06/26/2004                                 DISCHARGE SUMMARY   DISCHARGE DIAGNOSES:  1.  Noncardiac chest pain.  2.  Mildly elevated CPK-MB.  3.  Hyperlipidemia.  4.  Poorly controlled diabetes mellitus.  5.  Hypertension.   DISCHARGE MEDICATIONS:  1.  Novolin 70/30 55 units in the morning with 45 units in the evening.  2.  Estratest one daily.  3.  Avandia one daily.  4.  Amaryl one daily.   She was not clear on her drug doses, but she is to basically continue her  same outpatient medications.   DISPOSITION:  The patient will return to see Dr. Dagmar Hait in followup in several  weeks.   HISTORY:  Ms. Haughney is a 60 year old female who was admitted to [facility] for episodes of chest pain.  Her EKG was unremarkable.  She had a  negative stress Cardiolite study the week before.  Her CPK-MBs came back  mildly elevated with initial CPK of greater than 10.  Her subsequent CK-MB  was 5.7.  Troponin levels were negative.  Because of the confusion regarding  some elevated cardiac enzymes and some normal cardiac enzymes, we decided to  proceed with heart catheterization.  She was admitted for further  evaluation.   HOSPITAL COURSE BY PROBLEMS:  Chest pain.  The patient had a heart  catheterization on the following morning.  She was found to have smooth and  normal coronary arteries.  She had normal left ventricular systolic  function.  She did well following the cath and is now discharged in  satisfactory condition.  Her followup will be  with Dr. Dagmar Hait.  She is to continue with all her medications.  We had a long  discussion regarding her diet and exercise program.  She has multiple  risk  factors for cardiovascular disease.  She will follow up with Dr. Dagmar Hait in  several weeks.  She is to call me is she has any questions.       PJN/MEDQ  D:  06/26/2004  T:  06/27/2004  Job:  [id number]   cc:   Berneta Sages, M.D.  [address]  [place]  [place] [address]  Fax: [phone]

## 2010-12-06 NOTE — Cardiovascular Report (Signed)
NAMECHRISANN, RADKA NO.:  [id number]   MEDICAL RECORD NO.:  [id number]          PATIENT TYPE:  INP   LOCATION:  2011                         FACILITY:  [facility]   PHYSICIAN:  Thayer Headings, M.D. DATE OF BIRTH:  07/11/1951   DATE OF PROCEDURE:  06/26/2004  DATE OF DISCHARGE:                              CARDIAC CATHETERIZATION   Ms. Kerbo is a 60 year old female with a history of hypertension,  hypercholesterolemia and uncontrolled diabetes mellitus.  She was admitted  to the [facility] yesterday with episodes of chest pain.  She was  found to have mildly elevated CPK-MB levels.  Her troponin levels were  negative.  She was brought for heart catheterization because of her mixed  cardiac enzymes and her multiple risk factors for coronary artery disease.   PROCEDURE:  Left heart catheterization with coronary angiography.   HEMODYNAMICS:  LV pressure was 142/18 with an aortic pressure of 141/77.   ANGIOGRAPHY:  1.  Left main was smooth and normal.  2.  Left anterior descending artery:  The LAD is smooth and normal.  It      gives off a large diagonal branch which is normal.  3.  The left circumflex artery is smooth and normal.  It gives off a large      obtuse marginal artery which is normal.  4.  The right coronary artery is large and dominant.  It is smooth and      normal throughout its course.  The posterior descending artery is      normal.  There was a very small posterolateral branch which is also      normal.  5.  The left ventriculogram was performed in a 30 RAO position.  It reveals      normal left ventricular systolic function. There is a mild amount of      mitral regurgitation that may have been due to the catheter placement.   COMPLICATIONS:  None.   CONCLUSION:  1.  Smooth and normal coronary arteries.  2.  Normal left ventricular systolic function.   We will anticipate discharge later today.       PJN/MEDQ  D:  06/26/2004   T:  06/26/2004  Job:  [id number]   cc:   Berneta Sages, M.D.  [address]  [place]  [place] [address]  Fax: [phone]

## 2010-12-09 ENCOUNTER — Encounter (HOSPITAL_COMMUNITY): Payer: PRIVATE HEALTH INSURANCE

## 2010-12-13 ENCOUNTER — Other Ambulatory Visit: Payer: Self-pay | Admitting: Physician Assistant

## 2010-12-13 ENCOUNTER — Encounter (HOSPITAL_BASED_OUTPATIENT_CLINIC_OR_DEPARTMENT_OTHER): Payer: PRIVATE HEALTH INSURANCE | Admitting: Oncology

## 2010-12-13 ENCOUNTER — Encounter (HOSPITAL_COMMUNITY): Payer: PRIVATE HEALTH INSURANCE

## 2010-12-13 ENCOUNTER — Other Ambulatory Visit: Payer: Self-pay | Admitting: Oncology

## 2010-12-13 DIAGNOSIS — Z5111 Encounter for antineoplastic chemotherapy: Secondary | ICD-10-CM

## 2010-12-13 DIAGNOSIS — E118 Type 2 diabetes mellitus with unspecified complications: Secondary | ICD-10-CM

## 2010-12-13 DIAGNOSIS — D649 Anemia, unspecified: Secondary | ICD-10-CM

## 2010-12-13 DIAGNOSIS — Z5189 Encounter for other specified aftercare: Secondary | ICD-10-CM

## 2010-12-13 DIAGNOSIS — C50919 Malignant neoplasm of unspecified site of unspecified female breast: Secondary | ICD-10-CM

## 2010-12-13 DIAGNOSIS — R5383 Other fatigue: Secondary | ICD-10-CM

## 2010-12-13 LAB — CBC WITH DIFFERENTIAL/PLATELET
Basophils Absolute: 0.1 10*3/uL (ref 0.0–0.1)
EOS%: 1.6 % (ref 0.0–7.0)
Eosinophils Absolute: 0.1 10*3/uL (ref 0.0–0.5)
HGB: 10.4 g/dL — ABNORMAL LOW (ref 11.6–15.9)
MCH: 28 pg (ref 25.1–34.0)
NEUT#: 4 10*3/uL (ref 1.5–6.5)
RDW: 17 % — ABNORMAL HIGH (ref 11.2–14.5)
WBC: 6.4 10*3/uL (ref 3.9–10.3)
lymph#: 1.7 10*3/uL (ref 0.9–3.3)

## 2010-12-13 LAB — COMPREHENSIVE METABOLIC PANEL
AST: 20 U/L (ref 0–37)
Albumin: 3.1 g/dL — ABNORMAL LOW (ref 3.5–5.2)
Alkaline Phosphatase: 86 U/L (ref 39–117)
BUN: 16 mg/dL (ref 6–23)
Potassium: 3.9 mEq/L (ref 3.5–5.3)
Sodium: 140 mEq/L (ref 135–145)
Total Bilirubin: 0.2 mg/dL — ABNORMAL LOW (ref 0.3–1.2)

## 2010-12-13 LAB — WHOLE BLOOD GLUCOSE
Glucose: 98 mg/dL (ref 70–100)
HRS PC: 0.5 Hours

## 2010-12-13 LAB — URINALYSIS, MICROSCOPIC - CHCC
Bilirubin (Urine): NEGATIVE
Glucose: NEGATIVE g/dL
Nitrite: NEGATIVE
Protein: 300 mg/dL

## 2010-12-17 ENCOUNTER — Encounter (HOSPITAL_COMMUNITY): Payer: PRIVATE HEALTH INSURANCE

## 2010-12-20 ENCOUNTER — Other Ambulatory Visit: Payer: Self-pay | Admitting: Oncology

## 2010-12-20 ENCOUNTER — Encounter (HOSPITAL_BASED_OUTPATIENT_CLINIC_OR_DEPARTMENT_OTHER): Payer: PRIVATE HEALTH INSURANCE | Admitting: Oncology

## 2010-12-20 DIAGNOSIS — Z5111 Encounter for antineoplastic chemotherapy: Secondary | ICD-10-CM

## 2010-12-20 DIAGNOSIS — E1165 Type 2 diabetes mellitus with hyperglycemia: Secondary | ICD-10-CM

## 2010-12-20 DIAGNOSIS — C50919 Malignant neoplasm of unspecified site of unspecified female breast: Secondary | ICD-10-CM

## 2010-12-20 DIAGNOSIS — Z5189 Encounter for other specified aftercare: Secondary | ICD-10-CM

## 2010-12-20 DIAGNOSIS — R5381 Other malaise: Secondary | ICD-10-CM

## 2010-12-20 LAB — CBC WITH DIFFERENTIAL/PLATELET
Basophils Absolute: 0.1 10*3/uL (ref 0.0–0.1)
Eosinophils Absolute: 0.2 10*3/uL (ref 0.0–0.5)
HCT: 29.3 % — ABNORMAL LOW (ref 34.8–46.6)
LYMPH%: 26.8 % (ref 14.0–49.7)
MCV: 86.2 fL (ref 79.5–101.0)
MONO#: 0.6 10*3/uL (ref 0.1–0.9)
MONO%: 11.8 % (ref 0.0–14.0)
NEUT#: 2.9 10*3/uL (ref 1.5–6.5)
NEUT%: 56.2 % (ref 38.4–76.8)
Platelets: 448 10*3/uL — ABNORMAL HIGH (ref 145–400)
RBC: 3.4 10*6/uL — ABNORMAL LOW (ref 3.70–5.45)
WBC: 5.2 10*3/uL (ref 3.9–10.3)
nRBC: 0 % (ref 0–0)

## 2010-12-20 LAB — COMPREHENSIVE METABOLIC PANEL
ALT: 22 U/L (ref 0–35)
BUN: 17 mg/dL (ref 6–23)
CO2: 23 mEq/L (ref 19–32)
Calcium: 8.5 mg/dL (ref 8.4–10.5)
Chloride: 106 mEq/L (ref 96–112)
Creatinine, Ser: 0.94 mg/dL (ref 0.50–1.10)
Glucose, Bld: 201 mg/dL — ABNORMAL HIGH (ref 70–99)
Total Bilirubin: 0.2 mg/dL — ABNORMAL LOW (ref 0.3–1.2)

## 2010-12-23 ENCOUNTER — Encounter (HOSPITAL_COMMUNITY): Payer: PRIVATE HEALTH INSURANCE | Attending: Oncology

## 2010-12-23 DIAGNOSIS — D649 Anemia, unspecified: Secondary | ICD-10-CM | POA: Insufficient documentation

## 2011-01-02 ENCOUNTER — Encounter (HOSPITAL_BASED_OUTPATIENT_CLINIC_OR_DEPARTMENT_OTHER): Payer: PRIVATE HEALTH INSURANCE | Admitting: Oncology

## 2011-01-02 ENCOUNTER — Other Ambulatory Visit: Payer: Self-pay | Admitting: Oncology

## 2011-01-02 DIAGNOSIS — R509 Fever, unspecified: Secondary | ICD-10-CM

## 2011-01-02 DIAGNOSIS — C50919 Malignant neoplasm of unspecified site of unspecified female breast: Secondary | ICD-10-CM

## 2011-01-02 DIAGNOSIS — D649 Anemia, unspecified: Secondary | ICD-10-CM

## 2011-01-02 DIAGNOSIS — N644 Mastodynia: Secondary | ICD-10-CM

## 2011-01-02 DIAGNOSIS — Z5111 Encounter for antineoplastic chemotherapy: Secondary | ICD-10-CM

## 2011-01-02 DIAGNOSIS — R5383 Other fatigue: Secondary | ICD-10-CM

## 2011-01-02 DIAGNOSIS — E1165 Type 2 diabetes mellitus with hyperglycemia: Secondary | ICD-10-CM

## 2011-01-02 LAB — CBC WITH DIFFERENTIAL/PLATELET
BASO%: 0.7 % (ref 0.0–2.0)
Eosinophils Absolute: 0.3 10*3/uL (ref 0.0–0.5)
HCT: 31.3 % — ABNORMAL LOW (ref 34.8–46.6)
LYMPH%: 21.6 % (ref 14.0–49.7)
MCHC: 32.6 g/dL (ref 31.5–36.0)
MONO#: 0.7 10*3/uL (ref 0.1–0.9)
NEUT#: 4.7 10*3/uL (ref 1.5–6.5)
NEUT%: 64.5 % (ref 38.4–76.8)
Platelets: 317 10*3/uL (ref 145–400)
WBC: 7.3 10*3/uL (ref 3.9–10.3)
lymph#: 1.6 10*3/uL (ref 0.9–3.3)
nRBC: 0 % (ref 0–0)

## 2011-01-02 LAB — COMPREHENSIVE METABOLIC PANEL
ALT: 20 U/L (ref 0–35)
AST: 18 U/L (ref 0–37)
Albumin: 3.5 g/dL (ref 3.5–5.2)
BUN: 21 mg/dL (ref 6–23)
CO2: 24 mEq/L (ref 19–32)
Calcium: 9 mg/dL (ref 8.4–10.5)
Chloride: 106 mEq/L (ref 96–112)
Potassium: 3.8 mEq/L (ref 3.5–5.3)

## 2011-01-09 ENCOUNTER — Other Ambulatory Visit: Payer: Self-pay | Admitting: Oncology

## 2011-01-09 ENCOUNTER — Encounter (HOSPITAL_BASED_OUTPATIENT_CLINIC_OR_DEPARTMENT_OTHER): Payer: PRIVATE HEALTH INSURANCE | Admitting: Oncology

## 2011-01-09 DIAGNOSIS — D6481 Anemia due to antineoplastic chemotherapy: Secondary | ICD-10-CM

## 2011-01-09 DIAGNOSIS — C50919 Malignant neoplasm of unspecified site of unspecified female breast: Secondary | ICD-10-CM

## 2011-01-09 DIAGNOSIS — C50219 Malignant neoplasm of upper-inner quadrant of unspecified female breast: Secondary | ICD-10-CM

## 2011-01-09 DIAGNOSIS — E1165 Type 2 diabetes mellitus with hyperglycemia: Secondary | ICD-10-CM

## 2011-01-09 DIAGNOSIS — Z5111 Encounter for antineoplastic chemotherapy: Secondary | ICD-10-CM

## 2011-01-09 DIAGNOSIS — D649 Anemia, unspecified: Secondary | ICD-10-CM

## 2011-01-09 DIAGNOSIS — R5381 Other malaise: Secondary | ICD-10-CM

## 2011-01-09 DIAGNOSIS — T451X5A Adverse effect of antineoplastic and immunosuppressive drugs, initial encounter: Secondary | ICD-10-CM

## 2011-01-09 LAB — COMPREHENSIVE METABOLIC PANEL
ALT: 42 U/L — ABNORMAL HIGH (ref 0–35)
Albumin: 3.4 g/dL — ABNORMAL LOW (ref 3.5–5.2)
CO2: 27 mEq/L (ref 19–32)
Chloride: 106 mEq/L (ref 96–112)
Potassium: 3.8 mEq/L (ref 3.5–5.3)
Sodium: 141 mEq/L (ref 135–145)
Total Bilirubin: 0.2 mg/dL — ABNORMAL LOW (ref 0.3–1.2)
Total Protein: 6.3 g/dL (ref 6.0–8.3)

## 2011-01-09 LAB — CBC WITH DIFFERENTIAL/PLATELET
Basophils Absolute: 0.1 10*3/uL (ref 0.0–0.1)
Eosinophils Absolute: 0.2 10*3/uL (ref 0.0–0.5)
HGB: 10.2 g/dL — ABNORMAL LOW (ref 11.6–15.9)
MONO#: 0.3 10*3/uL (ref 0.1–0.9)
NEUT#: 3 10*3/uL (ref 1.5–6.5)
RBC: 3.6 10*6/uL — ABNORMAL LOW (ref 3.70–5.45)
RDW: 15.4 % — ABNORMAL HIGH (ref 11.2–14.5)
WBC: 4.8 10*3/uL (ref 3.9–10.3)
lymph#: 1.3 10*3/uL (ref 0.9–3.3)
nRBC: 0 % (ref 0–0)

## 2011-01-17 ENCOUNTER — Other Ambulatory Visit: Payer: Self-pay | Admitting: Oncology

## 2011-01-17 ENCOUNTER — Encounter (HOSPITAL_BASED_OUTPATIENT_CLINIC_OR_DEPARTMENT_OTHER): Payer: PRIVATE HEALTH INSURANCE | Admitting: Oncology

## 2011-01-17 DIAGNOSIS — E118 Type 2 diabetes mellitus with unspecified complications: Secondary | ICD-10-CM

## 2011-01-17 DIAGNOSIS — D649 Anemia, unspecified: Secondary | ICD-10-CM

## 2011-01-17 DIAGNOSIS — C50919 Malignant neoplasm of unspecified site of unspecified female breast: Secondary | ICD-10-CM

## 2011-01-17 DIAGNOSIS — R5381 Other malaise: Secondary | ICD-10-CM

## 2011-01-17 DIAGNOSIS — E1165 Type 2 diabetes mellitus with hyperglycemia: Secondary | ICD-10-CM

## 2011-01-17 DIAGNOSIS — Z5111 Encounter for antineoplastic chemotherapy: Secondary | ICD-10-CM

## 2011-01-17 LAB — CBC WITH DIFFERENTIAL/PLATELET
BASO%: 0.6 % (ref 0.0–2.0)
LYMPH%: 25.1 % (ref 14.0–49.7)
MCHC: 33.5 g/dL (ref 31.5–36.0)
MONO#: 0.2 10*3/uL (ref 0.1–0.9)
MONO%: 4.4 % (ref 0.0–14.0)
Platelets: 382 10*3/uL (ref 145–400)
RBC: 3.19 10*6/uL — ABNORMAL LOW (ref 3.70–5.45)
RDW: 17.3 % — ABNORMAL HIGH (ref 11.2–14.5)
WBC: 4.6 10*3/uL (ref 3.9–10.3)

## 2011-01-17 LAB — COMPREHENSIVE METABOLIC PANEL
ALT: 22 U/L (ref 0–35)
Alkaline Phosphatase: 73 U/L (ref 39–117)
CO2: 26 mEq/L (ref 19–32)
Sodium: 141 mEq/L (ref 135–145)
Total Bilirubin: 0.3 mg/dL (ref 0.3–1.2)
Total Protein: 6.1 g/dL (ref 6.0–8.3)

## 2011-01-23 ENCOUNTER — Other Ambulatory Visit: Payer: Self-pay | Admitting: Oncology

## 2011-01-23 ENCOUNTER — Encounter (HOSPITAL_BASED_OUTPATIENT_CLINIC_OR_DEPARTMENT_OTHER): Payer: PRIVATE HEALTH INSURANCE | Admitting: Oncology

## 2011-01-23 DIAGNOSIS — E118 Type 2 diabetes mellitus with unspecified complications: Secondary | ICD-10-CM

## 2011-01-23 DIAGNOSIS — R5381 Other malaise: Secondary | ICD-10-CM

## 2011-01-23 DIAGNOSIS — E1165 Type 2 diabetes mellitus with hyperglycemia: Secondary | ICD-10-CM

## 2011-01-23 DIAGNOSIS — D649 Anemia, unspecified: Secondary | ICD-10-CM

## 2011-01-23 DIAGNOSIS — C50919 Malignant neoplasm of unspecified site of unspecified female breast: Secondary | ICD-10-CM

## 2011-01-23 DIAGNOSIS — Z5111 Encounter for antineoplastic chemotherapy: Secondary | ICD-10-CM

## 2011-01-23 LAB — CBC WITH DIFFERENTIAL/PLATELET
BASO%: 0.3 % (ref 0.0–2.0)
LYMPH%: 27.5 % (ref 14.0–49.7)
MCHC: 33.1 g/dL (ref 31.5–36.0)
MCV: 87.9 fL (ref 79.5–101.0)
MONO%: 7 % (ref 0.0–14.0)
Platelets: 425 10*3/uL — ABNORMAL HIGH (ref 145–400)
RBC: 3.23 10*6/uL — ABNORMAL LOW (ref 3.70–5.45)

## 2011-01-23 LAB — COMPREHENSIVE METABOLIC PANEL
ALT: 31 U/L (ref 0–35)
AST: 20 U/L (ref 0–37)
Albumin: 3.5 g/dL (ref 3.5–5.2)
Calcium: 9.1 mg/dL (ref 8.4–10.5)
Chloride: 103 mEq/L (ref 96–112)
Potassium: 4 mEq/L (ref 3.5–5.3)
Sodium: 139 mEq/L (ref 135–145)

## 2011-01-29 ENCOUNTER — Other Ambulatory Visit: Payer: Self-pay | Admitting: Oncology

## 2011-01-29 ENCOUNTER — Encounter (HOSPITAL_BASED_OUTPATIENT_CLINIC_OR_DEPARTMENT_OTHER): Payer: PRIVATE HEALTH INSURANCE | Admitting: Oncology

## 2011-01-29 DIAGNOSIS — E118 Type 2 diabetes mellitus with unspecified complications: Secondary | ICD-10-CM

## 2011-01-29 DIAGNOSIS — C50919 Malignant neoplasm of unspecified site of unspecified female breast: Secondary | ICD-10-CM

## 2011-01-29 DIAGNOSIS — R5383 Other fatigue: Secondary | ICD-10-CM

## 2011-01-29 DIAGNOSIS — D649 Anemia, unspecified: Secondary | ICD-10-CM

## 2011-01-29 DIAGNOSIS — Z171 Estrogen receptor negative status [ER-]: Secondary | ICD-10-CM

## 2011-01-29 LAB — CBC WITH DIFFERENTIAL/PLATELET
BASO%: 0.8 % (ref 0.0–2.0)
EOS%: 2.6 % (ref 0.0–7.0)
MCH: 29.2 pg (ref 25.1–34.0)
MCHC: 33 g/dL (ref 31.5–36.0)
MCV: 88.5 fL (ref 79.5–101.0)
MONO%: 5.5 % (ref 0.0–14.0)
NEUT%: 61.1 % (ref 38.4–76.8)
RDW: 15.7 % — ABNORMAL HIGH (ref 11.2–14.5)
lymph#: 1.1 10*3/uL (ref 0.9–3.3)

## 2011-01-29 LAB — COMPREHENSIVE METABOLIC PANEL
ALT: 35 U/L (ref 0–35)
AST: 21 U/L (ref 0–37)
Alkaline Phosphatase: 76 U/L (ref 39–117)
Calcium: 9.1 mg/dL (ref 8.4–10.5)
Chloride: 102 mEq/L (ref 96–112)
Creatinine, Ser: 0.92 mg/dL (ref 0.50–1.10)
Potassium: 4.1 mEq/L (ref 3.5–5.3)

## 2011-02-13 ENCOUNTER — Encounter (HOSPITAL_BASED_OUTPATIENT_CLINIC_OR_DEPARTMENT_OTHER): Payer: PRIVATE HEALTH INSURANCE | Admitting: Oncology

## 2011-02-13 ENCOUNTER — Other Ambulatory Visit: Payer: Self-pay | Admitting: Oncology

## 2011-02-13 DIAGNOSIS — R5381 Other malaise: Secondary | ICD-10-CM

## 2011-02-13 DIAGNOSIS — IMO0002 Reserved for concepts with insufficient information to code with codable children: Secondary | ICD-10-CM

## 2011-02-13 DIAGNOSIS — D649 Anemia, unspecified: Secondary | ICD-10-CM

## 2011-02-13 DIAGNOSIS — C50919 Malignant neoplasm of unspecified site of unspecified female breast: Secondary | ICD-10-CM

## 2011-02-13 DIAGNOSIS — E1165 Type 2 diabetes mellitus with hyperglycemia: Secondary | ICD-10-CM

## 2011-02-13 DIAGNOSIS — R5383 Other fatigue: Secondary | ICD-10-CM

## 2011-02-13 LAB — CBC WITH DIFFERENTIAL/PLATELET
BASO%: 0.7 % (ref 0.0–2.0)
Basophils Absolute: 0.1 10*3/uL (ref 0.0–0.1)
EOS%: 1.9 % (ref 0.0–7.0)
HCT: 27.3 % — ABNORMAL LOW (ref 34.8–46.6)
HGB: 9 g/dL — ABNORMAL LOW (ref 11.6–15.9)
LYMPH%: 21.6 % (ref 14.0–49.7)
MCH: 29.4 pg (ref 25.1–34.0)
MCHC: 33 g/dL (ref 31.5–36.0)
MCV: 89.2 fL (ref 79.5–101.0)
MONO%: 12.4 % (ref 0.0–14.0)
NEUT%: 63.4 % (ref 38.4–76.8)
lymph#: 1.6 10*3/uL (ref 0.9–3.3)

## 2011-02-13 LAB — COMPREHENSIVE METABOLIC PANEL
AST: 20 U/L (ref 0–37)
Albumin: 3.3 g/dL — ABNORMAL LOW (ref 3.5–5.2)
BUN: 16 mg/dL (ref 6–23)
CO2: 26 mEq/L (ref 19–32)
Calcium: 8.7 mg/dL (ref 8.4–10.5)
Chloride: 103 mEq/L (ref 96–112)
Creatinine, Ser: 0.87 mg/dL (ref 0.50–1.10)
Potassium: 4.1 mEq/L (ref 3.5–5.3)

## 2011-02-13 LAB — URINALYSIS, MICROSCOPIC - CHCC
Bilirubin (Urine): NEGATIVE
Leukocyte Esterase: NEGATIVE
Nitrite: NEGATIVE
Protein: 2000 mg/dL
pH: 6 (ref 4.6–8.0)

## 2011-02-14 ENCOUNTER — Encounter (HOSPITAL_BASED_OUTPATIENT_CLINIC_OR_DEPARTMENT_OTHER): Payer: PRIVATE HEALTH INSURANCE | Admitting: Oncology

## 2011-02-14 DIAGNOSIS — C50919 Malignant neoplasm of unspecified site of unspecified female breast: Secondary | ICD-10-CM

## 2011-02-14 DIAGNOSIS — Z5111 Encounter for antineoplastic chemotherapy: Secondary | ICD-10-CM

## 2011-02-20 ENCOUNTER — Other Ambulatory Visit: Payer: Self-pay | Admitting: Oncology

## 2011-02-20 ENCOUNTER — Encounter (HOSPITAL_BASED_OUTPATIENT_CLINIC_OR_DEPARTMENT_OTHER): Payer: PRIVATE HEALTH INSURANCE | Admitting: Oncology

## 2011-02-20 DIAGNOSIS — E118 Type 2 diabetes mellitus with unspecified complications: Secondary | ICD-10-CM

## 2011-02-20 DIAGNOSIS — R5381 Other malaise: Secondary | ICD-10-CM

## 2011-02-20 DIAGNOSIS — G589 Mononeuropathy, unspecified: Secondary | ICD-10-CM

## 2011-02-20 DIAGNOSIS — D649 Anemia, unspecified: Secondary | ICD-10-CM

## 2011-02-20 DIAGNOSIS — C50919 Malignant neoplasm of unspecified site of unspecified female breast: Secondary | ICD-10-CM

## 2011-02-20 LAB — CBC WITH DIFFERENTIAL/PLATELET
Basophils Absolute: 0 10*3/uL (ref 0.0–0.1)
EOS%: 3.9 % (ref 0.0–7.0)
Eosinophils Absolute: 0.2 10*3/uL (ref 0.0–0.5)
HCT: 28.6 % — ABNORMAL LOW (ref 34.8–46.6)
HGB: 9.4 g/dL — ABNORMAL LOW (ref 11.6–15.9)
MCH: 29.5 pg (ref 25.1–34.0)
MCV: 89.7 fL (ref 79.5–101.0)
MONO%: 5.1 % (ref 0.0–14.0)
NEUT#: 3.4 10*3/uL (ref 1.5–6.5)
NEUT%: 66.7 % (ref 38.4–76.8)
Platelets: 422 10*3/uL — ABNORMAL HIGH (ref 145–400)

## 2011-02-28 ENCOUNTER — Other Ambulatory Visit: Payer: Self-pay | Admitting: Oncology

## 2011-02-28 ENCOUNTER — Encounter (HOSPITAL_BASED_OUTPATIENT_CLINIC_OR_DEPARTMENT_OTHER): Payer: Self-pay | Admitting: Oncology

## 2011-02-28 DIAGNOSIS — C50919 Malignant neoplasm of unspecified site of unspecified female breast: Secondary | ICD-10-CM

## 2011-02-28 DIAGNOSIS — Z171 Estrogen receptor negative status [ER-]: Secondary | ICD-10-CM

## 2011-02-28 DIAGNOSIS — E118 Type 2 diabetes mellitus with unspecified complications: Secondary | ICD-10-CM

## 2011-02-28 LAB — CBC WITH DIFFERENTIAL/PLATELET
BASO%: 0.5 % (ref 0.0–2.0)
Eosinophils Absolute: 0.1 10*3/uL (ref 0.0–0.5)
HCT: 30.8 % — ABNORMAL LOW (ref 34.8–46.6)
LYMPH%: 28.5 % (ref 14.0–49.7)
MCHC: 31.8 g/dL (ref 31.5–36.0)
MCV: 91.7 fL (ref 79.5–101.0)
MONO#: 0.6 10*3/uL (ref 0.1–0.9)
MONO%: 10.6 % (ref 0.0–14.0)
NEUT%: 58.4 % (ref 38.4–76.8)
Platelets: 415 10*3/uL — ABNORMAL HIGH (ref 145–400)
RBC: 3.36 10*6/uL — ABNORMAL LOW (ref 3.70–5.45)
WBC: 5.5 10*3/uL (ref 3.9–10.3)
nRBC: 0 % (ref 0–0)

## 2011-03-07 ENCOUNTER — Other Ambulatory Visit: Payer: Self-pay | Admitting: Oncology

## 2011-03-07 ENCOUNTER — Encounter (HOSPITAL_BASED_OUTPATIENT_CLINIC_OR_DEPARTMENT_OTHER): Payer: Self-pay | Admitting: Oncology

## 2011-03-07 DIAGNOSIS — E118 Type 2 diabetes mellitus with unspecified complications: Secondary | ICD-10-CM

## 2011-03-07 DIAGNOSIS — Z5111 Encounter for antineoplastic chemotherapy: Secondary | ICD-10-CM

## 2011-03-07 DIAGNOSIS — R5383 Other fatigue: Secondary | ICD-10-CM

## 2011-03-07 DIAGNOSIS — C50919 Malignant neoplasm of unspecified site of unspecified female breast: Secondary | ICD-10-CM

## 2011-03-07 DIAGNOSIS — D649 Anemia, unspecified: Secondary | ICD-10-CM

## 2011-03-07 LAB — CBC WITH DIFFERENTIAL/PLATELET
BASO%: 0.1 % (ref 0.0–2.0)
Basophils Absolute: 0 10*3/uL (ref 0.0–0.1)
EOS%: 2.2 % (ref 0.0–7.0)
HCT: 29.3 % — ABNORMAL LOW (ref 34.8–46.6)
HGB: 9.9 g/dL — ABNORMAL LOW (ref 11.6–15.9)
LYMPH%: 26.5 % (ref 14.0–49.7)
MCH: 30.5 pg (ref 25.1–34.0)
MCHC: 33.8 g/dL (ref 31.5–36.0)
MCV: 90.2 fL (ref 79.5–101.0)
MONO%: 5.7 % (ref 0.0–14.0)
NEUT%: 65.5 % (ref 38.4–76.8)

## 2011-03-07 LAB — COMPREHENSIVE METABOLIC PANEL
ALT: 32 U/L (ref 0–35)
AST: 25 U/L (ref 0–37)
Alkaline Phosphatase: 77 U/L (ref 39–117)
BUN: 17 mg/dL (ref 6–23)
Calcium: 9.3 mg/dL (ref 8.4–10.5)
Creatinine, Ser: 0.87 mg/dL (ref 0.50–1.10)
Total Bilirubin: 0.3 mg/dL (ref 0.3–1.2)

## 2011-03-14 ENCOUNTER — Other Ambulatory Visit: Payer: Self-pay | Admitting: Oncology

## 2011-03-14 ENCOUNTER — Encounter (HOSPITAL_BASED_OUTPATIENT_CLINIC_OR_DEPARTMENT_OTHER): Payer: Self-pay | Admitting: Oncology

## 2011-03-14 DIAGNOSIS — C50919 Malignant neoplasm of unspecified site of unspecified female breast: Secondary | ICD-10-CM

## 2011-03-14 DIAGNOSIS — Z5111 Encounter for antineoplastic chemotherapy: Secondary | ICD-10-CM

## 2011-03-14 DIAGNOSIS — R5383 Other fatigue: Secondary | ICD-10-CM

## 2011-03-14 DIAGNOSIS — D649 Anemia, unspecified: Secondary | ICD-10-CM

## 2011-03-14 DIAGNOSIS — E1165 Type 2 diabetes mellitus with hyperglycemia: Secondary | ICD-10-CM

## 2011-03-14 LAB — CBC WITH DIFFERENTIAL/PLATELET
Eosinophils Absolute: 0.2 10*3/uL (ref 0.0–0.5)
MONO#: 0.3 10*3/uL (ref 0.1–0.9)
NEUT#: 2.9 10*3/uL (ref 1.5–6.5)
Platelets: 385 10*3/uL (ref 145–400)
RBC: 3.24 10*6/uL — ABNORMAL LOW (ref 3.70–5.45)
RDW: 14.5 % (ref 11.2–14.5)
WBC: 5.2 10*3/uL (ref 3.9–10.3)
nRBC: 0 % (ref 0–0)

## 2011-03-14 LAB — COMPREHENSIVE METABOLIC PANEL
ALT: 24 U/L (ref 0–35)
CO2: 26 mEq/L (ref 19–32)
Calcium: 9.2 mg/dL (ref 8.4–10.5)
Chloride: 105 mEq/L (ref 96–112)
Sodium: 140 mEq/L (ref 135–145)
Total Protein: 6 g/dL (ref 6.0–8.3)

## 2011-03-19 ENCOUNTER — Ambulatory Visit
Admission: RE | Admit: 2011-03-19 | Discharge: 2011-03-19 | Disposition: A | Discharge status: Home / Self Care | Payer: Medicaid Other | Source: Ambulatory Visit | Attending: Radiation Oncology | Admitting: Radiation Oncology

## 2011-03-19 DIAGNOSIS — M79609 Pain in unspecified limb: Secondary | ICD-10-CM | POA: Insufficient documentation

## 2011-03-19 DIAGNOSIS — C50219 Malignant neoplasm of upper-inner quadrant of unspecified female breast: Secondary | ICD-10-CM | POA: Insufficient documentation

## 2011-03-19 DIAGNOSIS — Z510 Encounter for antineoplastic radiation therapy: Secondary | ICD-10-CM | POA: Insufficient documentation

## 2011-03-19 DIAGNOSIS — L299 Pruritus, unspecified: Secondary | ICD-10-CM | POA: Insufficient documentation

## 2011-03-19 DIAGNOSIS — I10 Essential (primary) hypertension: Secondary | ICD-10-CM | POA: Insufficient documentation

## 2011-03-19 DIAGNOSIS — D649 Anemia, unspecified: Secondary | ICD-10-CM | POA: Insufficient documentation

## 2011-03-19 DIAGNOSIS — E119 Type 2 diabetes mellitus without complications: Secondary | ICD-10-CM | POA: Insufficient documentation

## 2011-03-19 DIAGNOSIS — R5381 Other malaise: Secondary | ICD-10-CM | POA: Insufficient documentation

## 2011-03-19 DIAGNOSIS — K219 Gastro-esophageal reflux disease without esophagitis: Secondary | ICD-10-CM | POA: Insufficient documentation

## 2011-03-19 DIAGNOSIS — Z79899 Other long term (current) drug therapy: Secondary | ICD-10-CM | POA: Insufficient documentation

## 2011-03-21 ENCOUNTER — Encounter (HOSPITAL_BASED_OUTPATIENT_CLINIC_OR_DEPARTMENT_OTHER): Payer: Self-pay | Admitting: Oncology

## 2011-03-21 ENCOUNTER — Other Ambulatory Visit: Payer: Self-pay | Admitting: Oncology

## 2011-03-21 DIAGNOSIS — R5383 Other fatigue: Secondary | ICD-10-CM

## 2011-03-21 DIAGNOSIS — D649 Anemia, unspecified: Secondary | ICD-10-CM

## 2011-03-21 DIAGNOSIS — G609 Hereditary and idiopathic neuropathy, unspecified: Secondary | ICD-10-CM

## 2011-03-21 DIAGNOSIS — E1165 Type 2 diabetes mellitus with hyperglycemia: Secondary | ICD-10-CM

## 2011-03-21 DIAGNOSIS — Z5111 Encounter for antineoplastic chemotherapy: Secondary | ICD-10-CM

## 2011-03-21 DIAGNOSIS — C50919 Malignant neoplasm of unspecified site of unspecified female breast: Secondary | ICD-10-CM

## 2011-03-21 LAB — COMPREHENSIVE METABOLIC PANEL
ALT: 30 U/L (ref 0–35)
AST: 20 U/L (ref 0–37)
Calcium: 8.7 mg/dL (ref 8.4–10.5)
Chloride: 105 mEq/L (ref 96–112)
Creatinine, Ser: 0.81 mg/dL (ref 0.50–1.10)
Potassium: 3.7 mEq/L (ref 3.5–5.3)

## 2011-03-21 LAB — CBC WITH DIFFERENTIAL/PLATELET
BASO%: 0.5 % (ref 0.0–2.0)
LYMPH%: 31.1 % (ref 14.0–49.7)
MCHC: 32.4 g/dL (ref 31.5–36.0)
MONO#: 0.2 10*3/uL (ref 0.1–0.9)
RBC: 3.15 10*6/uL — ABNORMAL LOW (ref 3.70–5.45)
WBC: 4.1 10*3/uL (ref 3.9–10.3)
lymph#: 1.3 10*3/uL (ref 0.9–3.3)
nRBC: 0 % (ref 0–0)

## 2011-04-10 LAB — CBC
MCHC: 32.8
MCV: 87.4
Platelets: 350
RDW: 14.1
WBC: 7.5

## 2011-04-10 LAB — BASIC METABOLIC PANEL
BUN: 14
Chloride: 100
Creatinine, Ser: 1.05
Glucose, Bld: 336 — ABNORMAL HIGH

## 2011-04-18 ENCOUNTER — Encounter (HOSPITAL_BASED_OUTPATIENT_CLINIC_OR_DEPARTMENT_OTHER): Payer: Self-pay | Admitting: Oncology

## 2011-04-18 ENCOUNTER — Other Ambulatory Visit: Payer: Self-pay | Admitting: Oncology

## 2011-04-18 DIAGNOSIS — D649 Anemia, unspecified: Secondary | ICD-10-CM

## 2011-04-18 DIAGNOSIS — C50919 Malignant neoplasm of unspecified site of unspecified female breast: Secondary | ICD-10-CM

## 2011-04-18 DIAGNOSIS — Z5111 Encounter for antineoplastic chemotherapy: Secondary | ICD-10-CM

## 2011-04-18 LAB — COMPREHENSIVE METABOLIC PANEL
ALT: 20 U/L (ref 0–35)
AST: 18 U/L (ref 0–37)
Alkaline Phosphatase: 73 U/L (ref 39–117)
Glucose, Bld: 138 mg/dL — ABNORMAL HIGH (ref 70–99)
Sodium: 139 mEq/L (ref 135–145)
Total Bilirubin: 0.3 mg/dL (ref 0.3–1.2)
Total Protein: 6.3 g/dL (ref 6.0–8.3)

## 2011-04-18 LAB — CBC WITH DIFFERENTIAL/PLATELET
BASO%: 2 % (ref 0.0–2.0)
EOS%: 3.7 % (ref 0.0–7.0)
LYMPH%: 26.5 % (ref 14.0–49.7)
MCH: 29.7 pg (ref 25.1–34.0)
MCHC: 33.3 g/dL (ref 31.5–36.0)
MCV: 89 fL (ref 79.5–101.0)
MONO%: 6.8 % (ref 0.0–14.0)
Platelets: 369 10*3/uL (ref 145–400)
RBC: 3.76 10*6/uL (ref 3.70–5.45)

## 2011-04-28 ENCOUNTER — Emergency Department (HOSPITAL_COMMUNITY)
Admission: EM | Admit: 2011-04-28 | Discharge: 2011-04-28 | Disposition: A | Discharge status: Home / Self Care | Payer: Self-pay | Attending: Emergency Medicine | Admitting: Emergency Medicine

## 2011-04-28 ENCOUNTER — Emergency Department (HOSPITAL_COMMUNITY): Payer: Self-pay

## 2011-04-28 DIAGNOSIS — M25559 Pain in unspecified hip: Secondary | ICD-10-CM | POA: Insufficient documentation

## 2011-04-28 DIAGNOSIS — I10 Essential (primary) hypertension: Secondary | ICD-10-CM | POA: Insufficient documentation

## 2011-04-28 DIAGNOSIS — A499 Bacterial infection, unspecified: Secondary | ICD-10-CM | POA: Insufficient documentation

## 2011-04-28 DIAGNOSIS — N760 Acute vaginitis: Secondary | ICD-10-CM | POA: Insufficient documentation

## 2011-04-28 DIAGNOSIS — D649 Anemia, unspecified: Secondary | ICD-10-CM | POA: Insufficient documentation

## 2011-04-28 DIAGNOSIS — R52 Pain, unspecified: Secondary | ICD-10-CM | POA: Insufficient documentation

## 2011-04-28 DIAGNOSIS — M2550 Pain in unspecified joint: Secondary | ICD-10-CM | POA: Insufficient documentation

## 2011-04-28 DIAGNOSIS — Z853 Personal history of malignant neoplasm of breast: Secondary | ICD-10-CM | POA: Insufficient documentation

## 2011-04-28 DIAGNOSIS — E119 Type 2 diabetes mellitus without complications: Secondary | ICD-10-CM | POA: Insufficient documentation

## 2011-04-28 DIAGNOSIS — B9689 Other specified bacterial agents as the cause of diseases classified elsewhere: Secondary | ICD-10-CM | POA: Insufficient documentation

## 2011-04-28 DIAGNOSIS — E785 Hyperlipidemia, unspecified: Secondary | ICD-10-CM | POA: Insufficient documentation

## 2011-04-28 LAB — URINALYSIS, ROUTINE W REFLEX MICROSCOPIC
Bilirubin Urine: NEGATIVE
Glucose, UA: 250 mg/dL — AB
Ketones, ur: NEGATIVE mg/dL
Leukocytes, UA: NEGATIVE
Nitrite: NEGATIVE
Protein, ur: >300 mg/dL — AB
Specific Gravity, Urine: 1.025 (ref 1.005–1.030)
Urobilinogen, UA: 0.2 mg/dL (ref 0.0–1.0)
pH: 6.5 (ref 5.0–8.0)

## 2011-04-28 LAB — URINE MICROSCOPIC-ADD ON

## 2011-04-28 LAB — WET PREP, GENITAL
Trich, Wet Prep: NONE SEEN
Yeast Wet Prep HPF POC: NONE SEEN

## 2011-04-28 LAB — POCT I-STAT, CHEM 8
Creatinine, Ser: 0.9 mg/dL (ref 0.50–1.10)
Glucose, Bld: 187 mg/dL — ABNORMAL HIGH (ref 70–99)
Hemoglobin: 9.2 g/dL — ABNORMAL LOW (ref 12.0–15.0)
Potassium: 3.8 mEq/L (ref 3.5–5.1)

## 2011-04-29 LAB — URINE CULTURE
Colony Count: 6000
Culture  Setup Time: 201210081450

## 2011-04-29 LAB — GC/CHLAMYDIA PROBE AMP, GENITAL
Chlamydia, DNA Probe: NEGATIVE
GC Probe Amp, Genital: NEGATIVE

## 2011-05-26 NOTE — Progress Notes (Signed)
CC:   [name]. Dalbert Batman, M.D. Marcy Panning, M.D. Maisie Fus, M.D. Berneta Sages, M.D.  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  INDICATIONS FOR THERAPY:  Curative.  TREATMENT DATES:  04/07/2011 to 05/22/2011.  SITE AND DOSE:  [name] initially was treated with a course of whole breast radiation to a dose of 45 Gy in 25 fractions at 1.8 Gy per fraction.  Two medial and lateral tangent fields were used and 10 MV photons were used for these fields.  The patient then received an electron boost using 12 MeV electrons for an additional 16 Gy at 2 Gy per fraction.  The patient's final total dose therefore was 61 Gy.  NARRATIVE:  [name] did satisfactorily during the course of her treatment.  She did experience some discomfort in the breast region, and had the expected skin changes towards the end of her course.  Overall, her skin did satisfactorily and there was no moist desquamation at the end of treatment.  The patient has been having some additional health issues with some diffuse bony pain, which she states has been chronic. We discussed her needing to establish ongoing care for her additional issues.  FOLLOW UP APPOINTMENT:  1 month.    ______________________________ Jodelle Gross, M.D., Ph.D. JSM/MEDQ  D:  05/22/2011  T:  05/26/2011  Job:  [id number]

## 2011-06-04 ENCOUNTER — Telehealth: Payer: Self-pay | Admitting: Radiation Oncology

## 2011-06-04 ENCOUNTER — Telehealth: Payer: Self-pay | Admitting: Oncology

## 2011-06-04 NOTE — Telephone Encounter (Signed)
error 

## 2011-06-04 NOTE — Telephone Encounter (Signed)
Ms. Alexy left message on my voicemail. I returned call, no answer. Left message to call me back.

## 2011-06-05 ENCOUNTER — Telehealth: Payer: Self-pay | Admitting: Radiation Oncology

## 2011-06-05 NOTE — Telephone Encounter (Signed)
Disability claim form to Dr. Lisbeth Renshaw for signature.  Rec'd from Dr. Lisbeth Renshaw and faxed to claims office at [phone]

## 2011-06-18 NOTE — Telephone Encounter (Signed)
Faxed form to Disability office claims department - fax [phone]

## 2011-06-25 ENCOUNTER — Encounter: Payer: Self-pay | Admitting: Radiation Oncology

## 2011-06-26 ENCOUNTER — Encounter (INDEPENDENT_AMBULATORY_CARE_PROVIDER_SITE_OTHER): Payer: Self-pay | Admitting: General Surgery

## 2011-06-27 ENCOUNTER — Encounter: Payer: Self-pay | Admitting: Radiation Oncology

## 2011-06-27 ENCOUNTER — Ambulatory Visit
Admission: RE | Admit: 2011-06-27 | Discharge: 2011-06-27 | Disposition: A | Discharge status: Home / Self Care | Payer: Medicaid Other | Source: Ambulatory Visit | Attending: Radiation Oncology | Admitting: Radiation Oncology

## 2011-06-27 DIAGNOSIS — C50219 Malignant neoplasm of upper-inner quadrant of unspecified female breast: Secondary | ICD-10-CM

## 2011-06-27 DIAGNOSIS — C50212 Malignant neoplasm of upper-inner quadrant of left female breast: Secondary | ICD-10-CM | POA: Diagnosis present

## 2011-06-27 NOTE — Progress Notes (Signed)
Patient presents to the clinic today unaccompanied for follow up visit with Dr. Lisbeth Renshaw. Patient is alert and oriented to person, place, and time. No distress noted. Steady gait noted. Pleasant affect noted. Patient reports constant generalized aching of her bones 6 on a scale of 0-10. Patient continues to not rest well at night. Patient reports intermittent episodes of nausea but, denies emesis. Patient reports she is scheduled to follow up with Dr. Dalbert Batman next month. Also, patient reports seeing her ophthalmologist now that she has medicaid.

## 2011-06-27 NOTE — Progress Notes (Signed)
CC:   [name]. [name], M.D. [name], M.D.  DIAGNOSIS:  Invasive ductal carcinoma of the left breast.  INTERVAL HISTORY:  Ms. [name] returns to clinic today for followup.  She has done well since she finished her radiation treatment on 05/22/2011. The patient indicates that she is pleased with how her skin has healed up.  She does continue to complain of some bone pain in the left chest and left upper extremity.  Her energy level has been improving.  PHYSICAL EXAM:  Skin:  Hyperpigmentation is present in the treatment area.  The skin has healed nicely with some residual hyperpigmentation present.  No ongoing desquamation.  IMPRESSION AND PLAN:  Ms. [name] is doing very well at this time and appears to be healing up adequately after her radiation.  She will return to our clinic on a p.r.n. basis.    ______________________________ Jodelle Gross, M.D., Ph.D. JSM/MEDQ  D:  06/27/2011  T:  06/27/2011  Job:  [id number]

## 2011-07-02 ENCOUNTER — Telehealth (INDEPENDENT_AMBULATORY_CARE_PROVIDER_SITE_OTHER): Payer: Self-pay | Admitting: General Surgery

## 2011-07-02 NOTE — Telephone Encounter (Signed)
Patient is scheduled for 6 month breast check on 07/24/10. Her medicaid ends at the end of December and she is requesting to be seen prior to the end of December. Dr Dalbert Batman does not have anything and I let her know that but if you have a cancellation, if you could call her. Thanks.

## 2011-07-18 ENCOUNTER — Telehealth: Payer: Self-pay | Admitting: Oncology

## 2011-07-18 NOTE — Telephone Encounter (Signed)
lmonvm adviisng the pt of her jan 2013 appts

## 2011-07-24 ENCOUNTER — Ambulatory Visit (INDEPENDENT_AMBULATORY_CARE_PROVIDER_SITE_OTHER): Payer: Self-pay | Admitting: General Surgery

## 2011-08-11 ENCOUNTER — Telehealth: Payer: Self-pay | Admitting: Oncology

## 2011-08-11 NOTE — Telephone Encounter (Signed)
Pt called in to r/s her jan appts. Advised the pt that dr Laurelyn Sickle schedule is booked out till feb. Per pt she does not have any insurance at this time. Referred the pt over to darlena the financial counselor for further assistance. Pt will cb to r/s her appts

## 2011-08-13 ENCOUNTER — Telehealth: Payer: Self-pay | Admitting: Oncology

## 2011-08-13 NOTE — Telephone Encounter (Signed)
Returned patients call patient was concerned about discount card running out I advised patient to re-apply. She is going to come and pick up application.

## 2011-08-18 ENCOUNTER — Ambulatory Visit: Payer: Medicaid Other | Admitting: Oncology

## 2011-08-18 ENCOUNTER — Other Ambulatory Visit: Payer: Medicaid Other

## 2011-09-22 ENCOUNTER — Encounter: Payer: Self-pay | Admitting: Oncology

## 2011-09-22 NOTE — Progress Notes (Signed)
Pt came in today wanting to talk with [name] about her Medicaid and reapplying for epp assistance. I called Ms. [name]'s casework (Ms. [name] [phone]). Per Ms. [name] patient has as 4,020 ded to meet starting Jan 2013 to current.  At this time [name] do not have any outstanding medical bills. Advised Ms. [name] once she meet her ded her medicaid will be reinstated. She also reapplied for epp assistance. She did not bring proof of income, but stated she will fax it to today. [name] will process application once we received all paperwork.

## 2011-09-26 ENCOUNTER — Encounter: Payer: Self-pay | Admitting: Oncology

## 2011-09-26 NOTE — Progress Notes (Signed)
Patient approved for 100% EPP Discount for family of 1, income 13,752.00

## 2011-11-21 ENCOUNTER — Other Ambulatory Visit: Payer: Medicaid Other | Admitting: Lab

## 2011-11-21 ENCOUNTER — Ambulatory Visit: Payer: Medicaid Other | Admitting: Oncology

## 2011-11-24 ENCOUNTER — Ambulatory Visit (HOSPITAL_BASED_OUTPATIENT_CLINIC_OR_DEPARTMENT_OTHER): Payer: Self-pay | Admitting: Oncology

## 2011-11-24 ENCOUNTER — Encounter: Payer: Self-pay | Admitting: Oncology

## 2011-11-24 ENCOUNTER — Telehealth: Payer: Self-pay | Admitting: *Deleted

## 2011-11-24 ENCOUNTER — Other Ambulatory Visit (HOSPITAL_BASED_OUTPATIENT_CLINIC_OR_DEPARTMENT_OTHER): Payer: Self-pay | Admitting: Lab

## 2011-11-24 VITALS — BP 178/92 | HR 85 | Temp 98.5°F | Ht 65.5 in | Wt 226.9 lb

## 2011-11-24 DIAGNOSIS — D649 Anemia, unspecified: Secondary | ICD-10-CM

## 2011-11-24 DIAGNOSIS — C50219 Malignant neoplasm of upper-inner quadrant of unspecified female breast: Secondary | ICD-10-CM

## 2011-11-24 DIAGNOSIS — Z5111 Encounter for antineoplastic chemotherapy: Secondary | ICD-10-CM

## 2011-11-24 DIAGNOSIS — C50919 Malignant neoplasm of unspecified site of unspecified female breast: Secondary | ICD-10-CM

## 2011-11-24 DIAGNOSIS — R0789 Other chest pain: Secondary | ICD-10-CM

## 2011-11-24 DIAGNOSIS — E118 Type 2 diabetes mellitus with unspecified complications: Secondary | ICD-10-CM

## 2011-11-24 LAB — CBC WITH DIFFERENTIAL/PLATELET
BASO%: 1.7 % (ref 0.0–2.0)
EOS%: 3.2 % (ref 0.0–7.0)
Eosinophils Absolute: 0.2 10*3/uL (ref 0.0–0.5)
MCH: 28.3 pg (ref 25.1–34.0)
MCV: 86.4 fL (ref 79.5–101.0)
MONO%: 8.1 % (ref 0.0–14.0)
NEUT#: 3.1 10*3/uL (ref 1.5–6.5)
RBC: 3.71 10*6/uL (ref 3.70–5.45)
RDW: 14.6 % — ABNORMAL HIGH (ref 11.2–14.5)
nRBC: 0 % (ref 0–0)

## 2011-11-24 LAB — COMPREHENSIVE METABOLIC PANEL
ALT: 28 U/L (ref 0–35)
AST: 19 U/L (ref 0–37)
Alkaline Phosphatase: 77 U/L (ref 39–117)
Sodium: 141 mEq/L (ref 135–145)
Total Bilirubin: 0.3 mg/dL (ref 0.3–1.2)
Total Protein: 5.9 g/dL — ABNORMAL LOW (ref 6.0–8.3)

## 2011-11-24 NOTE — Telephone Encounter (Signed)
gave patient appointment for 12-22-2011 whole body bone scan and 12-31-2011 at 9:30am to see the md printed out calendar and gave to the patient

## 2011-11-24 NOTE — Progress Notes (Signed)
OFFICE PROGRESS NOTE  CC Dr. Fanny Skates Dr. Evette Cristal Dr. Berneta Sages  DIAGNOSIS: -year-old female with stage I breast cancer.  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 She underwent a lumpectomy with sentinel node dissection.  #2 she then received 4 cycles of dose dense FEC 100 and 12 weeks of single agent Taxol given adjuvantly.  #3 she then went on to receive radiation therapy to the left breast. All of her therapy was completed in October 2000 and  CURRENT THERAPY:Observation  INTERVAL HISTORY: [name] 61 y.o. female returns for Followup visit. Overall she seems to be doing a well. She does continue to complain of aches and pains. She has new pain which is around the sternal area she tells me this is new. She otherwise denies any nausea vomiting fevers chills she has no night sweats no diaphoresis no shortness of breath. She denies any abdominal pain no back pain no peripheral paresthesias. She has no hematuria hematochezia melena hemoptysis or hematemesis. She also denies having any masses in her breasts. Remainder of the 10 point review of systems is  MEDICAL HISTORY: Past Medical History  Diagnosis Date  . Diabetes mellitus   . Arthritis   . Hypertension   . GERD (gastroesophageal reflux disease)   . Anemia     ALLERGIES:   has no known allergies.  MEDICATIONS:  Current Outpatient Prescriptions  Medication Sig Dispense Refill  . aspirin 81 MG tablet Take 81 mg by mouth daily.        Marland Kitchen docusate sodium (COLACE) 100 MG capsule Take 100 mg by mouth 2 (two) times daily.        Marland Kitchen HYDROcodone-acetaminophen (VICODIN) 5-500 MG per tablet Take 1 tablet by mouth every 6 (six) hours as needed.        . Insulin Isophane & Regular (HUMULIN 70/30 [place]) Inject into the skin.        . IRON PO Take by mouth.        Marland Kitchen lisinopril (PRINIVIL,ZESTRIL) 10 MG tablet Take 10 mg by mouth daily.        Marland Kitchen  METFORMIN HCL PO Take by mouth.        . Multiple Vitamin (MULTIVITAMIN) tablet Take 1 tablet by mouth daily.        Marland Kitchen omeprazole (PRILOSEC) 10 MG capsule Take 10 mg by mouth daily.        . ondansetron (ZOFRAN) 4 MG tablet Take 4 mg by mouth every 8 (eight) hours as needed.          SURGICAL HISTORY:  Past Surgical History  Procedure Date  . Abdominal hysterectomy   . Cesarean section   . Eye surgery   . Tonsillectomy     REVIEW OF SYSTEMS:  Pertinent items are noted in HPI.   PHYSICAL EXAMINATION: General appearance: alert, cooperative and appears stated age Lymph nodes: Cervical, supraclavicular, and axillary nodes normal. Resp: clear to auscultation bilaterally and normal percussion bilaterally Back: symmetric, no curvature. ROM normal. No CVA tenderness. Cardio: regular rate and rhythm, S1, S2 normal, no murmur, click, rub or gallop GI: soft, non-tender; bowel sounds normal; no masses,  no organomegaly Extremities: extremities normal, atraumatic, no cyanosis or edema Neurologic: Grossly normal Bilateral breast examination right breast without any masses nipple discharge or skin changes. Left breast reveals a well-healed surgical scar, it is barely visible. There are no dominant masses no nipple discharge.  ECOG PERFORMANCE STATUS: 1 - Symptomatic but completely ambulatory  Blood pressure 178/92, pulse 85, temperature 98.5 F (36.9 C), temperature source Oral, height 5' 5.5" (1.664 m), weight 226 lb 14.4 oz (102.921 kg).  LABORATORY DATA: Lab Results  Component Value Date   WBC 5.4 11/24/2011   HGB 10.5* 11/24/2011   HCT 32.1* 11/24/2011   MCV 86.4 11/24/2011   PLT 332 11/24/2011      Chemistry      Component Value Date/Time   NA 139 04/28/2011 1303   K 3.8 04/28/2011 1303   CL 106 04/28/2011 1303   CO2 24 04/18/2011 0935   CO2 24 04/18/2011 0935   BUN 12 04/28/2011 1303   CREATININE 0.90 04/28/2011 1303   GLU 98 12/13/2010 1108      Component Value Date/Time   CALCIUM 9.0  04/18/2011 0935   CALCIUM 9.0 04/18/2011 0935   ALKPHOS 73 04/18/2011 0935   ALKPHOS 73 04/18/2011 0935   AST 18 04/18/2011 0935   AST 18 04/18/2011 0935   ALT 20 04/18/2011 0935   ALT 20 04/18/2011 0935   BILITOT 0.3 04/18/2011 0935   BILITOT 0.3 04/18/2011 0935       RADIOGRAPHIC STUDIES:  No results found.  ASSESSMENT: 61 year old female with triple negative invasive ductal carcinoma of the left breast diagnosed in 2012. She is status post lumpectomy with sentinel node biopsy for a stage I disease. She then went on to receive adjuvant chemotherapy consisting of dose dense FEC 100 for 4 cycles followed by 12 weeks of single agent Taxol. Overall she tolerated the chemotherapy relatively well. She also completed postlumpectomy radiation therapy in October 2012. Since then she has been on observation only. Patient now complains of having some sternal chest pain without any associated shortness of breath. Since patient is at high risk for recurrent disease. I have recommended doing a bone scan.   PLAN: We will schedule a bone scan as soon as possible. And I will then see her back after the scan. In the meantime she knows to call me with any problems questions or concerns.   All questions were answered. The patient knows to call the clinic with any problems, questions or concerns. We can certainly see the patient much sooner if necessary.  I spent 25 minutes counseling the patient face to face. The total time spent in the appointment was 30 minutes.    Marcy Panning, MD Medical/Oncology [facility] [phone] (beeper) [phone] (Office)  11/24/2011, 12:10 PM

## 2011-11-24 NOTE — Patient Instructions (Signed)
1. I ordered a bone scan to see why you are having pains.  2. I will see you back in 3 weeks

## 2011-12-19 ENCOUNTER — Encounter: Payer: Self-pay | Admitting: Oncology

## 2011-12-19 NOTE — Progress Notes (Signed)
Patient stop by office this morning to drop off some disability paper for her property taxes.I gave them to [place] Nurse.

## 2011-12-22 ENCOUNTER — Encounter (HOSPITAL_COMMUNITY)
Admission: RE | Admit: 2011-12-22 | Discharge: 2011-12-22 | Disposition: A | Discharge status: Home / Self Care | Payer: Self-pay | Source: Ambulatory Visit | Attending: Oncology | Admitting: Oncology

## 2011-12-22 DIAGNOSIS — C50219 Malignant neoplasm of upper-inner quadrant of unspecified female breast: Secondary | ICD-10-CM | POA: Insufficient documentation

## 2011-12-22 DIAGNOSIS — M899 Disorder of bone, unspecified: Secondary | ICD-10-CM | POA: Insufficient documentation

## 2011-12-22 MED ORDER — TECHNETIUM TC 99M MEDRONATE IV KIT
25.0000 | PACK | Freq: Once | INTRAVENOUS | Status: AC | PRN
  Administered 2011-12-22: 25 via INTRAVENOUS

## 2011-12-31 ENCOUNTER — Ambulatory Visit: Payer: Self-pay | Admitting: Lab

## 2011-12-31 ENCOUNTER — Ambulatory Visit (HOSPITAL_BASED_OUTPATIENT_CLINIC_OR_DEPARTMENT_OTHER): Payer: Self-pay | Admitting: Oncology

## 2011-12-31 ENCOUNTER — Telehealth: Payer: Self-pay | Admitting: *Deleted

## 2011-12-31 ENCOUNTER — Encounter: Payer: Self-pay | Admitting: Oncology

## 2011-12-31 VITALS — BP 177/86 | HR 100 | Temp 98.3°F | Ht 65.5 in | Wt 220.3 lb

## 2011-12-31 DIAGNOSIS — IMO0001 Reserved for inherently not codable concepts without codable children: Secondary | ICD-10-CM

## 2011-12-31 DIAGNOSIS — N289 Disorder of kidney and ureter, unspecified: Secondary | ICD-10-CM

## 2011-12-31 DIAGNOSIS — M791 Myalgia, unspecified site: Secondary | ICD-10-CM

## 2011-12-31 DIAGNOSIS — G629 Polyneuropathy, unspecified: Secondary | ICD-10-CM

## 2011-12-31 DIAGNOSIS — C50219 Malignant neoplasm of upper-inner quadrant of unspecified female breast: Secondary | ICD-10-CM

## 2011-12-31 DIAGNOSIS — D649 Anemia, unspecified: Secondary | ICD-10-CM

## 2011-12-31 DIAGNOSIS — C50919 Malignant neoplasm of unspecified site of unspecified female breast: Secondary | ICD-10-CM

## 2011-12-31 HISTORY — DX: Polyneuropathy, unspecified: G62.9

## 2011-12-31 HISTORY — DX: Myalgia, unspecified site: M79.10

## 2011-12-31 MED ORDER — TRAMADOL HCL 50 MG PO TABS: 50.0000 mg | ORAL_TABLET | Freq: Four times a day (QID) | ORAL | Status: AC | PRN

## 2011-12-31 MED ORDER — HYDROCODONE-ACETAMINOPHEN 5-500 MG PO TABS: 1.0000 | ORAL_TABLET | Freq: Four times a day (QID) | ORAL | Status: DC | PRN

## 2011-12-31 NOTE — Progress Notes (Signed)
OFFICE PROGRESS NOTE  CC Dr. Fanny Skates Dr. Evette Cristal Dr. Berneta Sages  DIAGNOSIS: -year-old female with stage I breast cancer.  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 She underwent a lumpectomy with sentinel node dissection.  #2 she then received 4 cycles of dose dense FEC 100 and 12 weeks of single agent Taxol given adjuvantly.  #3 she then went on to receive radiation therapy to the left breast. All of her therapy was completed in October 2012  #4 bone scan performed on 12/22/2011 no evidence of metastatic disease a significant amount of arthritis.  CURRENT THERAPY:Observation  INTERVAL HISTORY: [name] 60 y.o. female returns for Followup visit.She clearly is in significant amount of pain she continues to complain of her bones hurting all over because of the pain she is not able to sleep at night. She is also complaining of stumbling when she stands and almost falls do to her pain. She is denying any nausea or vomiting. Patient does take pain medications they help relieve some of the pain but does not take it away completely because of this she is not sleeping well. She denies having any headaches or shortness of breath cough or any bleeding problems or bruit easy bruising. Her main her of the review of systems is negative. MEDICAL HISTORY: Past Medical History  Diagnosis Date  . Diabetes mellitus   . Arthritis   . Hypertension   . GERD (gastroesophageal reflux disease)   . Anemia   . Myalgia 12/31/2011  . Neuropathy 12/31/2011    ALLERGIES:   has no known allergies.  MEDICATIONS:  Current Outpatient Prescriptions  Medication Sig Dispense Refill  . aspirin 81 MG tablet Take 81 mg by mouth daily.        Marland Kitchen docusate sodium (COLACE) 100 MG capsule Take 100 mg by mouth 2 (two) times daily.        Marland Kitchen HYDROcodone-acetaminophen (VICODIN) 5-500 MG per tablet Take 1 tablet by mouth every 6  (six) hours as needed.        . Insulin Isophane & Regular (HUMULIN 70/30 [place]) Inject into the skin.        . IRON PO Take by mouth.        Marland Kitchen lisinopril (PRINIVIL,ZESTRIL) 10 MG tablet Take 10 mg by mouth daily.        Marland Kitchen METFORMIN HCL PO Take by mouth.        . Multiple Vitamin (MULTIVITAMIN) tablet Take 1 tablet by mouth daily.        Marland Kitchen omeprazole (PRILOSEC) 10 MG capsule Take 10 mg by mouth daily.        . ondansetron (ZOFRAN) 4 MG tablet Take 4 mg by mouth every 8 (eight) hours as needed.          SURGICAL HISTORY:  Past Surgical History  Procedure Date  . Abdominal hysterectomy   . Cesarean section   . Eye surgery   . Tonsillectomy     REVIEW OF SYSTEMS:  Pertinent items are noted in HPI.   PHYSICAL EXAMINATION: General appearance: alert, cooperative and appears stated age Lymph nodes: Cervical, supraclavicular, and axillary nodes normal. Resp: clear to auscultation bilaterally and normal percussion bilaterally Back: symmetric, no curvature. ROM normal. No CVA tenderness. Cardio: regular rate and rhythm, S1, S2 normal, no murmur, click, rub or gallop GI: soft, non-tender; bowel sounds normal; no masses,  no organomegaly Extremities: extremities normal, atraumatic,  no cyanosis or edema Neurologic: Grossly normal Bilateral breast examination right breast without any masses nipple discharge or skin changes. Left breast reveals a well-healed surgical scar, it is barely visible. There are no dominant masses no nipple discharge.  ECOG PERFORMANCE STATUS: 1 - Symptomatic but completely ambulatory  Blood pressure 177/86, pulse 100, temperature 98.3 F (36.8 C), height 5' 5.5" (1.664 m), weight 220 lb 4.8 oz (99.927 kg).  LABORATORY DATA: Lab Results  Component Value Date   WBC 5.4 11/24/2011   HGB 10.5* 11/24/2011   HCT 32.1* 11/24/2011   MCV 86.4 11/24/2011   PLT 332 11/24/2011      Chemistry      Component Value Date/Time   NA 141 11/24/2011 0940   K 3.9 11/24/2011 0940   CL 106  11/24/2011 0940   CO2 27 11/24/2011 0940   BUN 19 11/24/2011 0940   CREATININE 1.19* 11/24/2011 0940   GLU 98 12/13/2010 1108      Component Value Date/Time   CALCIUM 8.5 11/24/2011 0940   ALKPHOS 77 11/24/2011 0940   AST 19 11/24/2011 0940   ALT 28 11/24/2011 0940   BILITOT 0.3 11/24/2011 0940       RADIOGRAPHIC STUDIES: NUCLEAR MEDICINE WHOLE BODY BONE SCINTIGRAPHY  Technique: Whole body anterior and posterior images were obtained  approximately 3 hours after intravenous injection of  radiopharmaceutical.  Radiopharmaceutical: 25MILLI CURIE TC-MDP TECHNETIUM TC 28M  MEDRONATE IV KIT  Comparison: None  Radiographic correlation: Pelvic radiograph 04/28/2011, chest CT  10/19/2010  Findings:  Uptake at the shoulders, wrists, knees, and feet, typically  degenerative or arthritic. Uptake at greater trochanteric region  right femur, uncertain etiology.  No definite radiographic abnormalities are seen at this site to  account for the abnormal bone scan.  Subtle uptake at the left lateral aspect of mid to lower thoracic  spine, corresponding to the endplate spur formation on chest CT.  Uptake identified in the lower cervical spine near the  cervicothoracic junction at midline to left, nonspecific but  potentially degenerative in origin.  No other sites of abnormal osseous tracer localization identified.  Expected urinary tract and soft tissue distribution of tracer.  IMPRESSION:  Abnormal uptake right femur at the greater trochanteric region  laterally, of uncertain etiology.  Abnormal uptake in lower cervical spine near the cervicothoracic  junction.  Radiographic correlation of these sites recommended.  No other abnormal sites of osseous tracer accumulation identified.  Original Report Authenticated By: Burnetta Sabin, M.D.   ASSESSMENT: 61 year old female with:  1.  triple negative invasive ductal carcinoma of the left breast diagnosed in 2012. She is status post lumpectomy with sentinel  node biopsy for a stage I disease. She then went on to receive adjuvant chemotherapy consisting of dose dense FEC 100 for 4 cycles followed by 12 weeks of single agent Taxol. Overall she tolerated the chemotherapy relatively well. She also completed postlumpectomy radiation therapy in October 2012. Since then she has been on observation only.  #2 patient had a bone scan performed that did not reveal any evidence of metastatic disease but she is noted to have significant amount of arthritis.    #3 musculoskeletal pain chronic do to her arthritis and possibly peripheral neuropathies.  #4 chronic anemia with mild renal insufficiency  PLAN:   #1 I discussed the bone scan results with the patient she is reassured she also understands that she has a lot of arthritis and this may be causing her pain.  #2 patient has  been taking a lot of acetaminophen I have discouraged this. I have given her prescriptions for hydrocodone as well as tramadol to see if this helps her.  #3 she will be seen back in 3 months time.  #4 patient is anemic we will do workup for anemia of chronic disease.  All questions were answered. The patient knows to call the clinic with any problems, questions or concerns. We can certainly see the patient much sooner if necessary.  I spent 25 minutes counseling the patient face to face. The total time spent in the appointment was 30 minutes.    Marcy Panning, MD Medical/Oncology [facility] [phone] (beeper) [phone] (Office)  12/31/2011, 10:23 AM

## 2011-12-31 NOTE — Patient Instructions (Addendum)
1. Take hydrocodone as needed for pain.  2. Begin tramadol 50 mg daily at night time  3. Labs to be for work up of anemia  4. I will see you back in 3 months.

## 2011-12-31 NOTE — Telephone Encounter (Signed)
gave patient appointment for 04-01-2012 placed patient on the walk in on 06-12-203 printed out calendar and gave to the patient

## 2012-01-01 LAB — IRON AND TIBC: %SAT: 23 % (ref 20–55)

## 2012-01-01 LAB — ERYTHROPOIETIN: Erythropoietin: 15.4 m[IU]/mL (ref 2.6–34.0)

## 2012-03-31 ENCOUNTER — Other Ambulatory Visit: Payer: Self-pay | Admitting: Medical Oncology

## 2012-03-31 DIAGNOSIS — C50219 Malignant neoplasm of upper-inner quadrant of unspecified female breast: Secondary | ICD-10-CM

## 2012-04-01 ENCOUNTER — Ambulatory Visit (HOSPITAL_BASED_OUTPATIENT_CLINIC_OR_DEPARTMENT_OTHER): Payer: Self-pay | Admitting: Oncology

## 2012-04-01 ENCOUNTER — Encounter: Payer: Self-pay | Admitting: Oncology

## 2012-04-01 ENCOUNTER — Telehealth: Payer: Self-pay | Admitting: *Deleted

## 2012-04-01 ENCOUNTER — Other Ambulatory Visit (HOSPITAL_BASED_OUTPATIENT_CLINIC_OR_DEPARTMENT_OTHER): Payer: Self-pay | Admitting: Lab

## 2012-04-01 VITALS — BP 101/75 | HR 85 | Temp 98.6°F | Resp 20 | Ht 65.5 in | Wt 217.0 lb

## 2012-04-01 DIAGNOSIS — D649 Anemia, unspecified: Secondary | ICD-10-CM

## 2012-04-01 DIAGNOSIS — M129 Arthropathy, unspecified: Secondary | ICD-10-CM

## 2012-04-01 DIAGNOSIS — C50219 Malignant neoplasm of upper-inner quadrant of unspecified female breast: Secondary | ICD-10-CM

## 2012-04-01 DIAGNOSIS — C50919 Malignant neoplasm of unspecified site of unspecified female breast: Secondary | ICD-10-CM

## 2012-04-01 DIAGNOSIS — N289 Disorder of kidney and ureter, unspecified: Secondary | ICD-10-CM

## 2012-04-01 LAB — CBC WITH DIFFERENTIAL/PLATELET
BASO%: 1.3 % (ref 0.0–2.0)
Eosinophils Absolute: 0.2 10*3/uL (ref 0.0–0.5)
MCHC: 32.9 g/dL (ref 31.5–36.0)
MCV: 86.1 fL (ref 79.5–101.0)
MONO#: 0.5 10*3/uL (ref 0.1–0.9)
MONO%: 7.9 % (ref 0.0–14.0)
NEUT#: 3.6 10*3/uL (ref 1.5–6.5)
RBC: 3.74 10*6/uL (ref 3.70–5.45)
RDW: 14.8 % — ABNORMAL HIGH (ref 11.2–14.5)
WBC: 6.4 10*3/uL (ref 3.9–10.3)

## 2012-04-01 LAB — COMPREHENSIVE METABOLIC PANEL (CC13)
ALT: 30 U/L (ref 0–55)
Albumin: 2.8 g/dL — ABNORMAL LOW (ref 3.5–5.0)
Alkaline Phosphatase: 94 U/L (ref 40–150)
Glucose: 189 mg/dl — ABNORMAL HIGH (ref 70–99)
Potassium: 3.9 mEq/L (ref 3.5–5.1)
Sodium: 139 mEq/L (ref 136–145)
Total Protein: 6.4 g/dL (ref 6.4–8.3)

## 2012-04-01 NOTE — Patient Instructions (Addendum)
Port flush today  I will see you back in 6 months

## 2012-04-01 NOTE — Telephone Encounter (Signed)
Gave patient appointments for flushes and lab and md

## 2012-04-01 NOTE — Progress Notes (Signed)
OFFICE PROGRESS NOTE  CC Dr. Fanny Skates Dr. Evette Cristal Dr. Berneta Sages  DIAGNOSIS: -year-old female with stage I breast cancer.  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 She underwent a lumpectomy with sentinel node dissection.  #2 she then received 4 cycles of dose dense FEC 100 and 12 weeks of single agent Taxol given adjuvantly.  #3 she then went on to receive radiation therapy to the left breast. All of her therapy was completed in October 2012  #4 bone scan performed on 12/22/2011 no evidence of metastatic disease a significant amount of arthritis.  CURRENT THERAPY:Observation  INTERVAL HISTORY: Cathy STAIANO 61 y.o. female returns for Followup visit.all in all patient is doing well. She continues to have her aches and pains. But she does tell me that they are manageable. She otherwise denies any nausea vomiting fevers chills night sweats headaches. She still has some pain in her breast. She has no other hematuria hematochezia melena hemoptysis or hematemesis no dizziness no double vision blurring of vision. Major the 10 point review of systems is negative. MEDICAL HISTORY: Past Medical History  Diagnosis Date  . Diabetes mellitus   . Arthritis   . Hypertension   . GERD (gastroesophageal reflux disease)   . Anemia   . Myalgia 12/31/2011  . Neuropathy 12/31/2011    ALLERGIES:   has no known allergies.  MEDICATIONS:  Current Outpatient Prescriptions  Medication Sig Dispense Refill  . aspirin 81 MG tablet Take 81 mg by mouth daily.        Marland Kitchen docusate sodium (COLACE) 100 MG capsule Take 100 mg by mouth 2 (two) times daily.        Marland Kitchen HYDROcodone-acetaminophen (VICODIN) 5-500 MG per tablet Take 1 tablet by mouth every 6 (six) hours as needed.  60 tablet  0  . Insulin Isophane & Regular (HUMULIN 70/30 [place]) Inject into the skin.        . IRON PO Take by mouth.        Marland Kitchen lisinopril  (PRINIVIL,ZESTRIL) 10 MG tablet Take 10 mg by mouth daily.        Marland Kitchen METFORMIN HCL PO Take by mouth.        . Multiple Vitamin (MULTIVITAMIN) tablet Take 1 tablet by mouth daily.        Marland Kitchen omeprazole (PRILOSEC) 10 MG capsule Take 10 mg by mouth daily.        . ondansetron (ZOFRAN) 4 MG tablet Take 4 mg by mouth every 8 (eight) hours as needed.          SURGICAL HISTORY:  Past Surgical History  Procedure Date  . Abdominal hysterectomy   . Cesarean section   . Eye surgery   . Tonsillectomy     REVIEW OF SYSTEMS:  Pertinent items are noted in HPI.   PHYSICAL EXAMINATION:  Well-developed well-nourished female in no acute distress. HEENT exam EOMI PERRLA sclerae anicteric no conjunctival pallor oral mucosa is moist neck is supple lungs are clear bilaterally to auscultation and percussion cardiovascular is regular rate rhythm no murmurs gallops or rubs abdomen is soft nontender nondistended bowel sounds are present no HSM extremities no clubbing edema or cyanosis neuro patient's alert oriented otherwise nonfocal. Bilateral breast examination right breast without any masses nipple discharge or skin changes. Left breast reveals a well-healed surgical scar, it is barely visible. There are no dominant masses no nipple discharge.  ECOG PERFORMANCE STATUS: 1 -  Symptomatic but completely ambulatory  Blood pressure 101/75, pulse 85, temperature 98.6 F (37 C), temperature source Oral, resp. rate 20, height 5' 5.5" (1.664 m), weight 217 lb (98.431 kg).  LABORATORY DATA: Lab Results  Component Value Date   WBC 6.4 04/01/2012   HGB 10.6* 04/01/2012   HCT 32.2* 04/01/2012   MCV 86.1 04/01/2012   PLT 335 04/01/2012      Chemistry      Component Value Date/Time   NA 139 04/01/2012 1114   NA 141 11/24/2011 0940   K 3.9 04/01/2012 1114   K 3.9 11/24/2011 0940   CL 104 04/01/2012 1114   CL 106 11/24/2011 0940   CO2 26 04/01/2012 1114   CO2 27 11/24/2011 0940   BUN 22.0 04/01/2012 1114   BUN 19 11/24/2011 0940    CREATININE 1.4* 04/01/2012 1114   CREATININE 1.19* 11/24/2011 0940   GLU 98 12/13/2010 1108      Component Value Date/Time   CALCIUM 9.3 04/01/2012 1114   CALCIUM 8.5 11/24/2011 0940   ALKPHOS 94 04/01/2012 1114   ALKPHOS 77 11/24/2011 0940   AST 23 04/01/2012 1114   AST 19 11/24/2011 0940   ALT 30 04/01/2012 1114   ALT 28 11/24/2011 0940   BILITOT 0.30 04/01/2012 1114   BILITOT 0.3 11/24/2011 0940       RADIOGRAPHIC STUDIES: NUCLEAR MEDICINE WHOLE BODY BONE SCINTIGRAPHY  Technique: Whole body anterior and posterior images were obtained  approximately 3 hours after intravenous injection of  radiopharmaceutical.  Radiopharmaceutical: 25MILLI CURIE TC-MDP TECHNETIUM TC 85M  MEDRONATE IV KIT  Comparison: None  Radiographic correlation: Pelvic radiograph 04/28/2011, chest CT  10/19/2010  Findings:  Uptake at the shoulders, wrists, knees, and feet, typically  degenerative or arthritic. Uptake at greater trochanteric region  right femur, uncertain etiology.  No definite radiographic abnormalities are seen at this site to  account for the abnormal bone scan.  Subtle uptake at the left lateral aspect of mid to lower thoracic  spine, corresponding to the endplate spur formation on chest CT.  Uptake identified in the lower cervical spine near the  cervicothoracic junction at midline to left, nonspecific but  potentially degenerative in origin.  No other sites of abnormal osseous tracer localization identified.  Expected urinary tract and soft tissue distribution of tracer.  IMPRESSION:  Abnormal uptake right femur at the greater trochanteric region  laterally, of uncertain etiology.  Abnormal uptake in lower cervical spine near the cervicothoracic  junction.  Radiographic correlation of these sites recommended.  No other abnormal sites of osseous tracer accumulation identified.  Original Report Authenticated By: Burnetta Sabin, M.D.   ASSESSMENT: 61 year old female with:  1.  triple negative  invasive ductal carcinoma of the left breast diagnosed in 2012. She is status post lumpectomy with sentinel node biopsy for a stage I disease. She then went on to receive adjuvant chemotherapy consisting of dose dense FEC 100 for 4 cycles followed by 12 weeks of single agent Taxol. Overall she tolerated the chemotherapy relatively well. She also completed postlumpectomy radiation therapy in October 2012. Since then she has been on observation only.  #2 patient had a bone scan performed that did not reveal any evidence of metastatic disease but she is noted to have significant amount of arthritis.    #3 musculoskeletal pain chronic do to her arthritis and possibly peripheral neuropathies.  #4 chronic anemia with mild renal insufficiency  PLAN:  #1 patient will continue to be followed every 6 months  with [place].  #2 she is encouraged to continue seeing her primary care physician for all of her other ongoing medical issues.  All questions were answered. The patient knows to call the clinic with any problems, questions or concerns. We can certainly see the patient much sooner if necessary.  I spent 25 minutes counseling the patient face to face. The total time spent in the appointment was 30 minutes.    Marcy Panning, MD Medical/Oncology [facility] [phone] (beeper) [phone] (Office)  04/01/2012, 12:33 PM

## 2012-04-23 ENCOUNTER — Encounter: Payer: Self-pay | Admitting: Oncology

## 2012-04-23 NOTE — Progress Notes (Signed)
Faxed disability form to [id number].

## 2012-06-01 ENCOUNTER — Telehealth: Payer: Self-pay | Admitting: Oncology

## 2012-06-01 ENCOUNTER — Ambulatory Visit (HOSPITAL_BASED_OUTPATIENT_CLINIC_OR_DEPARTMENT_OTHER): Payer: Self-pay

## 2012-06-01 VITALS — BP 169/82 | HR 79 | Temp 97.2°F | Resp 18

## 2012-06-01 DIAGNOSIS — Z452 Encounter for adjustment and management of vascular access device: Secondary | ICD-10-CM

## 2012-06-01 DIAGNOSIS — C50919 Malignant neoplasm of unspecified site of unspecified female breast: Secondary | ICD-10-CM

## 2012-06-01 MED ORDER — HEPARIN SOD (PORK) LOCK FLUSH 100 UNIT/ML IV SOLN
500.0000 [IU] | Freq: Once | INTRAVENOUS | Status: AC
  Administered 2012-06-01: 500 [IU] via INTRAVENOUS
  Filled 2012-06-01: qty 5

## 2012-06-01 MED ORDER — SODIUM CHLORIDE 0.9 % IJ SOLN
10.0000 mL | INTRAMUSCULAR | Status: DC | PRN
  Administered 2012-06-01: 10 mL via INTRAVENOUS
  Filled 2012-06-01: qty 10

## 2012-06-01 NOTE — Telephone Encounter (Signed)
gve the pt her jan,march 2014 appt calendars.

## 2012-08-02 ENCOUNTER — Encounter: Payer: Self-pay | Admitting: Oncology

## 2012-08-02 ENCOUNTER — Ambulatory Visit (HOSPITAL_BASED_OUTPATIENT_CLINIC_OR_DEPARTMENT_OTHER): Payer: Self-pay

## 2012-08-02 VITALS — BP 152/86 | HR 81 | Temp 97.8°F

## 2012-08-02 DIAGNOSIS — C50919 Malignant neoplasm of unspecified site of unspecified female breast: Secondary | ICD-10-CM

## 2012-08-02 DIAGNOSIS — Z452 Encounter for adjustment and management of vascular access device: Secondary | ICD-10-CM

## 2012-08-02 MED ORDER — SODIUM CHLORIDE 0.9 % IJ SOLN
10.0000 mL | INTRAMUSCULAR | Status: DC | PRN
  Administered 2012-08-02: 10 mL via INTRAVENOUS
  Filled 2012-08-02: qty 10

## 2012-08-02 MED ORDER — HEPARIN SOD (PORK) LOCK FLUSH 100 UNIT/ML IV SOLN
500.0000 [IU] | Freq: Once | INTRAVENOUS | Status: AC
  Administered 2012-08-02: 500 [IU] via INTRAVENOUS
  Filled 2012-08-02: qty 5

## 2012-08-02 NOTE — Progress Notes (Signed)
Pt came in today requesting to re-apply for financial assistance. Advised patient her discount expired 09/25/12. She can come back around the September 18, 2012 to pickup another application, but can not process until her old discount expires. Forward to Raquel to check  Medicaid status.

## 2012-08-20 ENCOUNTER — Encounter: Payer: Self-pay | Admitting: Oncology

## 2012-08-20 NOTE — Progress Notes (Signed)
Put disability form on nurse's desk. °

## 2012-08-23 ENCOUNTER — Encounter: Payer: Self-pay | Admitting: Oncology

## 2012-08-23 NOTE — Progress Notes (Signed)
Faxed disability form to [id number].

## 2012-10-01 ENCOUNTER — Other Ambulatory Visit (HOSPITAL_BASED_OUTPATIENT_CLINIC_OR_DEPARTMENT_OTHER): Payer: Self-pay | Admitting: Lab

## 2012-10-01 ENCOUNTER — Telehealth: Payer: Self-pay | Admitting: *Deleted

## 2012-10-01 ENCOUNTER — Ambulatory Visit (HOSPITAL_BASED_OUTPATIENT_CLINIC_OR_DEPARTMENT_OTHER): Payer: Self-pay | Admitting: Oncology

## 2012-10-01 ENCOUNTER — Encounter: Payer: Self-pay | Admitting: Oncology

## 2012-10-01 ENCOUNTER — Ambulatory Visit: Payer: Self-pay

## 2012-10-01 VITALS — BP 184/95 | HR 85 | Temp 98.5°F | Resp 18

## 2012-10-01 VITALS — BP 165/90 | HR 86 | Temp 98.5°F | Resp 20 | Ht 65.0 in | Wt 220.4 lb

## 2012-10-01 DIAGNOSIS — D649 Anemia, unspecified: Secondary | ICD-10-CM

## 2012-10-01 DIAGNOSIS — IMO0001 Reserved for inherently not codable concepts without codable children: Secondary | ICD-10-CM

## 2012-10-01 DIAGNOSIS — N289 Disorder of kidney and ureter, unspecified: Secondary | ICD-10-CM

## 2012-10-01 DIAGNOSIS — C50219 Malignant neoplasm of upper-inner quadrant of unspecified female breast: Secondary | ICD-10-CM

## 2012-10-01 DIAGNOSIS — C50212 Malignant neoplasm of upper-inner quadrant of left female breast: Secondary | ICD-10-CM

## 2012-10-01 LAB — COMPREHENSIVE METABOLIC PANEL (CC13)
ALT: 21 U/L (ref 0–55)
AST: 15 U/L (ref 5–34)
Albumin: 2.2 g/dL — ABNORMAL LOW (ref 3.5–5.0)
Alkaline Phosphatase: 92 U/L (ref 40–150)
Potassium: 3.8 mEq/L (ref 3.5–5.1)
Sodium: 141 mEq/L (ref 136–145)
Total Bilirubin: 0.22 mg/dL (ref 0.20–1.20)
Total Protein: 6.4 g/dL (ref 6.4–8.3)

## 2012-10-01 LAB — CBC WITH DIFFERENTIAL/PLATELET
BASO%: 1.4 % (ref 0.0–2.0)
EOS%: 2.9 % (ref 0.0–7.0)
Eosinophils Absolute: 0.2 10*3/uL (ref 0.0–0.5)
LYMPH%: 31.1 % (ref 14.0–49.7)
MCH: 27.4 pg (ref 25.1–34.0)
MCHC: 32.7 g/dL (ref 31.5–36.0)
MCV: 83.8 fL (ref 79.5–101.0)
MONO%: 8.6 % (ref 0.0–14.0)
NEUT#: 3.7 10*3/uL (ref 1.5–6.5)
Platelets: 332 10*3/uL (ref 145–400)
RBC: 3.38 10*6/uL — ABNORMAL LOW (ref 3.70–5.45)
RDW: 15.3 % — ABNORMAL HIGH (ref 11.2–14.5)

## 2012-10-01 MED ORDER — SODIUM CHLORIDE 0.9 % IJ SOLN
10.0000 mL | INTRAMUSCULAR | Status: DC | PRN
  Administered 2012-10-01: 10 mL via INTRAVENOUS
  Filled 2012-10-01: qty 10

## 2012-10-01 MED ORDER — HEPARIN SOD (PORK) LOCK FLUSH 100 UNIT/ML IV SOLN
500.0000 [IU] | Freq: Once | INTRAVENOUS | Status: AC
  Administered 2012-10-01: 500 [IU] via INTRAVENOUS
  Filled 2012-10-01: qty 5

## 2012-10-01 NOTE — Telephone Encounter (Signed)
appts made and printed. Appts was given for every 2 months flush, six ov with khan on the same day lab and flush will be completed. Also appt made at  the imaging of [place].

## 2012-10-01 NOTE — Progress Notes (Signed)
OFFICE PROGRESS NOTE  CC Dr. Fanny Skates Dr. Evette Cristal Dr. Berneta Sages  DIAGNOSIS: -year-old female with stage I breast cancer.  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 She underwent a lumpectomy with sentinel node dissection.  #2 she then received 4 cycles of dose dense FEC 100 and 12 weeks of single agent Taxol given adjuvantly.  #3 she then went on to receive radiation therapy to the left breast. All of her therapy was completed in October 2012  #4 bone scan performed on 12/22/2011 no evidence of metastatic disease a significant amount of arthritis.  CURRENT THERAPY:Observation  INTERVAL HISTORY: [name] 62 y.o. female returns for Followup visit.all in all patient is doing well. She continues to have her aches and pains. But she does tell me that they are manageable. She otherwise denies any nausea vomiting fevers chills night sweats headaches. She still has some pain in her breast. She has no other hematuria hematochezia melena hemoptysis or hematemesis no dizziness no double vision blurring of vision. Major the 10 point review of systems is negative. MEDICAL HISTORY: Past Medical History  Diagnosis Date  . Diabetes mellitus   . Arthritis   . Hypertension   . GERD (gastroesophageal reflux disease)   . Anemia   . Myalgia 12/31/2011  . Neuropathy 12/31/2011    ALLERGIES:  has No Known Allergies.  MEDICATIONS:  Current Outpatient Prescriptions  Medication Sig Dispense Refill  . aspirin 81 MG tablet Take 81 mg by mouth daily.        Marland Kitchen docusate sodium (COLACE) 100 MG capsule Take 100 mg by mouth 2 (two) times daily.        Marland Kitchen HYDROcodone-acetaminophen (VICODIN) 5-500 MG per tablet Take 1 tablet by mouth every 6 (six) hours as needed.  60 tablet  0  . Insulin Isophane & Regular (HUMULIN 70/30 [place]) Inject into the skin.        . IRON PO Take by mouth.        Marland Kitchen lisinopril  (PRINIVIL,ZESTRIL) 10 MG tablet Take 10 mg by mouth daily.        . Magnesium Hydroxide (MAGNESIA PO) Take by mouth daily.      Marland Kitchen METFORMIN HCL PO Take by mouth.        . Multiple Vitamin (MULTIVITAMIN) tablet Take 1 tablet by mouth daily.        Marland Kitchen omeprazole (PRILOSEC) 10 MG capsule Take 10 mg by mouth daily.        . ondansetron (ZOFRAN) 4 MG tablet Take 4 mg by mouth every 8 (eight) hours as needed.         No current facility-administered medications for this visit.    SURGICAL HISTORY:  Past Surgical History  Procedure Laterality Date  . Abdominal hysterectomy    . Cesarean section    . Eye surgery    . Tonsillectomy      REVIEW OF SYSTEMS:  Pertinent items are noted in HPI.   PHYSICAL EXAMINATION:  Well-developed well-nourished female in no acute distress. HEENT exam EOMI PERRLA sclerae anicteric no conjunctival pallor oral mucosa is moist neck is supple lungs are clear bilaterally to auscultation and percussion cardiovascular is regular rate rhythm no murmurs gallops or rubs abdomen is soft nontender nondistended bowel sounds are present no HSM extremities no clubbing edema or cyanosis neuro patient's alert oriented otherwise nonfocal. Bilateral breast examination right breast without any masses nipple discharge  or skin changes. Left breast reveals a well-healed surgical scar, it is barely visible. There are no dominant masses no nipple discharge.  ECOG PERFORMANCE STATUS: 1 - Symptomatic but completely ambulatory  Blood pressure 165/90, pulse 86, temperature 98.5 F (36.9 C), temperature source Oral, resp. rate 20, height 5\' 5"  (1.651 m), weight 220 lb 6.4 oz (99.973 kg).  LABORATORY DATA: Lab Results  Component Value Date   WBC 6.4 04/01/2012   HGB 10.6* 04/01/2012   HCT 32.2* 04/01/2012   MCV 86.1 04/01/2012   PLT 335 04/01/2012      Chemistry      Component Value Date/Time   NA 139 04/01/2012 1114   NA 141 11/24/2011 0940   K 3.9 04/01/2012 1114   K 3.9 11/24/2011 0940    CL 104 04/01/2012 1114   CL 106 11/24/2011 0940   CO2 26 04/01/2012 1114   CO2 27 11/24/2011 0940   BUN 22.0 04/01/2012 1114   BUN 19 11/24/2011 0940   CREATININE 1.4* 04/01/2012 1114   CREATININE 1.19* 11/24/2011 0940   GLU 98 12/13/2010 1108      Component Value Date/Time   CALCIUM 9.3 04/01/2012 1114   CALCIUM 8.5 11/24/2011 0940   ALKPHOS 94 04/01/2012 1114   ALKPHOS 77 11/24/2011 0940   AST 23 04/01/2012 1114   AST 19 11/24/2011 0940   ALT 30 04/01/2012 1114   ALT 28 11/24/2011 0940   BILITOT 0.30 04/01/2012 1114   BILITOT 0.3 11/24/2011 0940       RADIOGRAPHIC STUDIES: NUCLEAR MEDICINE WHOLE BODY BONE SCINTIGRAPHY  Technique: Whole body anterior and posterior images were obtained  approximately 3 hours after intravenous injection of  radiopharmaceutical.  Radiopharmaceutical: 25MILLI CURIE TC-MDP TECHNETIUM TC 40M  MEDRONATE IV KIT  Comparison: None  Radiographic correlation: Pelvic radiograph 04/28/2011, chest CT  10/19/2010  Findings:  Uptake at the shoulders, wrists, knees, and feet, typically  degenerative or arthritic. Uptake at greater trochanteric region  right femur, uncertain etiology.  No definite radiographic abnormalities are seen at this site to  account for the abnormal bone scan.  Subtle uptake at the left lateral aspect of mid to lower thoracic  spine, corresponding to the endplate spur formation on chest CT.  Uptake identified in the lower cervical spine near the  cervicothoracic junction at midline to left, nonspecific but  potentially degenerative in origin.  No other sites of abnormal osseous tracer localization identified.  Expected urinary tract and soft tissue distribution of tracer.  IMPRESSION:  Abnormal uptake right femur at the greater trochanteric region  laterally, of uncertain etiology.  Abnormal uptake in lower cervical spine near the cervicothoracic  junction.  Radiographic correlation of these sites recommended.  No other abnormal sites of osseous  tracer accumulation identified.  Original Report Authenticated By: Burnetta Sabin, M.D.   ASSESSMENT: 62 year old female with:  1.  triple negative invasive ductal carcinoma of the left breast diagnosed in 2012. She is status post lumpectomy with sentinel node biopsy for a stage I disease. She then went on to receive adjuvant chemotherapy consisting of dose dense FEC 100 for 4 cycles followed by 12 weeks of single agent Taxol. Overall she tolerated the chemotherapy relatively well. She also completed postlumpectomy radiation therapy in October 2012. Since then she has been on observation only.  #2 patient had a bone scan performed that did not reveal any evidence of metastatic disease but she is noted to have significant amount of arthritis.    #3 musculoskeletal pain  chronic do to her arthritis and possibly peripheral neuropathies.  #4 chronic anemia with mild renal insufficiency  PLAN:   #1 patient does need a mammogram performed however she does not have insurance. I have recommended that she see Sigmund Hazel for the [employer] to get a mammogram.  #1 patient will continue to be followed every 6 months with [place].    All questions were answered. The patient knows to call the clinic with any problems, questions or concerns. We can certainly see the patient much sooner if necessary.  I spent 25 minutes counseling the patient face to face. The total time spent in the appointment was 30 minutes.    Marcy Panning, MD Medical/Oncology [facility] [phone] (beeper) [phone] (Office)  10/01/2012, 11:58 AM

## 2012-10-01 NOTE — Patient Instructions (Addendum)
Proceed with mammogram  Port flush every 2 months  I will see you back in 6 months

## 2012-10-19 ENCOUNTER — Encounter: Payer: Self-pay | Admitting: Oncology

## 2012-10-19 NOTE — Progress Notes (Signed)
After review of account, it is ok to do the new one. I based it on the year date on the actual application, not the date in the [facility] screen. 95% ind 10/19/12-04/20/13. I will send letter and card to the patient. All documents are scanned in.

## 2012-10-19 NOTE — Progress Notes (Signed)
Called the patient to advise her that the discount is good thru 11/30/12. I advised her to call me by 5/1 for new application to be sent to her.

## 2012-10-22 ENCOUNTER — Encounter: Payer: Self-pay | Admitting: Oncology

## 2012-10-22 ENCOUNTER — Inpatient Hospital Stay: Admission: RE | Admit: 2012-10-22 | Payer: Self-pay | Source: Ambulatory Visit

## 2012-10-22 NOTE — Progress Notes (Signed)
Patient had left a message she was [facility] and was told she was a [facility] patient. She said she was approved. I didn't get a chance to call her back. She came in office to advise. I called and had to leave [name] a message. I advised the patient I would call her and let her know when Cathy Harm called me back and then she can it rescheduled.

## 2012-10-25 ENCOUNTER — Encounter: Payer: Self-pay | Admitting: Oncology

## 2012-10-25 NOTE — Progress Notes (Signed)
[name] called and said the patient is going to be screened today by [place]. They will determine if she is [facility]P or not They will call her and then decision will be made on her mammogram. I advised her the patient didn't say she was having any problems to me that she had to have mammogram right now.

## 2012-11-10 ENCOUNTER — Ambulatory Visit
Admission: RE | Admit: 2012-11-10 | Discharge: 2012-11-10 | Disposition: A | Discharge status: Home / Self Care | Payer: No Typology Code available for payment source | Source: Ambulatory Visit | Attending: Oncology | Admitting: Oncology

## 2012-11-10 DIAGNOSIS — C50212 Malignant neoplasm of upper-inner quadrant of left female breast: Secondary | ICD-10-CM

## 2012-11-23 ENCOUNTER — Encounter: Payer: Self-pay | Admitting: Oncology

## 2012-11-23 NOTE — Progress Notes (Signed)
Put disability form on nurse's desk. °

## 2012-12-01 ENCOUNTER — Ambulatory Visit (HOSPITAL_BASED_OUTPATIENT_CLINIC_OR_DEPARTMENT_OTHER): Payer: BC Managed Care – PPO

## 2012-12-01 DIAGNOSIS — C50219 Malignant neoplasm of upper-inner quadrant of unspecified female breast: Secondary | ICD-10-CM

## 2012-12-01 DIAGNOSIS — Z452 Encounter for adjustment and management of vascular access device: Secondary | ICD-10-CM

## 2012-12-01 DIAGNOSIS — C50919 Malignant neoplasm of unspecified site of unspecified female breast: Secondary | ICD-10-CM

## 2012-12-01 MED ORDER — SODIUM CHLORIDE 0.9 % IJ SOLN
10.0000 mL | INTRAMUSCULAR | Status: DC | PRN
  Administered 2012-12-01: 10 mL via INTRAVENOUS
  Filled 2012-12-01: qty 10

## 2012-12-01 MED ORDER — HEPARIN SOD (PORK) LOCK FLUSH 100 UNIT/ML IV SOLN
500.0000 [IU] | Freq: Once | INTRAVENOUS | Status: AC
  Administered 2012-12-01: 500 [IU] via INTRAVENOUS
  Filled 2012-12-01: qty 5

## 2013-01-03 ENCOUNTER — Other Ambulatory Visit: Payer: Self-pay | Admitting: Internal Medicine

## 2013-01-03 ENCOUNTER — Encounter (HOSPITAL_COMMUNITY): Payer: Self-pay | Admitting: Internal Medicine

## 2013-01-03 DIAGNOSIS — N289 Disorder of kidney and ureter, unspecified: Secondary | ICD-10-CM

## 2013-01-03 DIAGNOSIS — D509 Iron deficiency anemia, unspecified: Secondary | ICD-10-CM

## 2013-01-03 DIAGNOSIS — I10 Essential (primary) hypertension: Secondary | ICD-10-CM

## 2013-01-11 ENCOUNTER — Ambulatory Visit (HOSPITAL_COMMUNITY)
Admission: RE | Admit: 2013-01-11 | Discharge: 2013-01-11 | Disposition: A | Discharge status: Home / Self Care | Payer: BC Managed Care – PPO | Source: Ambulatory Visit | Attending: Cardiovascular Disease | Admitting: Cardiovascular Disease

## 2013-01-11 ENCOUNTER — Other Ambulatory Visit: Payer: Self-pay | Admitting: Internal Medicine

## 2013-01-11 DIAGNOSIS — N289 Disorder of kidney and ureter, unspecified: Secondary | ICD-10-CM

## 2013-01-11 DIAGNOSIS — I10 Essential (primary) hypertension: Secondary | ICD-10-CM | POA: Insufficient documentation

## 2013-01-11 DIAGNOSIS — I714 Abdominal aortic aneurysm, without rupture, unspecified: Secondary | ICD-10-CM

## 2013-01-11 DIAGNOSIS — D509 Iron deficiency anemia, unspecified: Secondary | ICD-10-CM | POA: Insufficient documentation

## 2013-01-11 NOTE — Progress Notes (Signed)
Mesenteric Duplex Completed. Negative. [name]

## 2013-01-11 NOTE — Progress Notes (Signed)
Renal Duplex Performed. Negative. [name]

## 2013-01-18 DIAGNOSIS — M205X9 Other deformities of toe(s) (acquired), unspecified foot: Secondary | ICD-10-CM | POA: Diagnosis not present

## 2013-01-18 DIAGNOSIS — M21619 Bunion of unspecified foot: Secondary | ICD-10-CM | POA: Diagnosis not present

## 2013-01-19 DIAGNOSIS — E785 Hyperlipidemia, unspecified: Secondary | ICD-10-CM | POA: Diagnosis not present

## 2013-01-19 DIAGNOSIS — Z6838 Body mass index (BMI) 38.0-38.9, adult: Secondary | ICD-10-CM | POA: Diagnosis not present

## 2013-01-19 DIAGNOSIS — R0609 Other forms of dyspnea: Secondary | ICD-10-CM | POA: Diagnosis not present

## 2013-01-19 DIAGNOSIS — E1129 Type 2 diabetes mellitus with other diabetic kidney complication: Secondary | ICD-10-CM | POA: Diagnosis not present

## 2013-01-19 DIAGNOSIS — N184 Chronic kidney disease, stage 4 (severe): Secondary | ICD-10-CM | POA: Diagnosis not present

## 2013-01-19 DIAGNOSIS — I10 Essential (primary) hypertension: Secondary | ICD-10-CM | POA: Diagnosis not present

## 2013-01-19 DIAGNOSIS — I872 Venous insufficiency (chronic) (peripheral): Secondary | ICD-10-CM | POA: Diagnosis not present

## 2013-01-19 DIAGNOSIS — M21619 Bunion of unspecified foot: Secondary | ICD-10-CM | POA: Diagnosis not present

## 2013-01-20 ENCOUNTER — Telehealth: Payer: Self-pay | Admitting: Oncology

## 2013-01-26 DIAGNOSIS — N184 Chronic kidney disease, stage 4 (severe): Secondary | ICD-10-CM | POA: Diagnosis not present

## 2013-01-26 DIAGNOSIS — E119 Type 2 diabetes mellitus without complications: Secondary | ICD-10-CM | POA: Diagnosis not present

## 2013-01-26 DIAGNOSIS — D649 Anemia, unspecified: Secondary | ICD-10-CM | POA: Diagnosis not present

## 2013-01-26 DIAGNOSIS — D509 Iron deficiency anemia, unspecified: Secondary | ICD-10-CM | POA: Diagnosis not present

## 2013-01-26 DIAGNOSIS — N2581 Secondary hyperparathyroidism of renal origin: Secondary | ICD-10-CM | POA: Diagnosis not present

## 2013-01-27 DIAGNOSIS — E1129 Type 2 diabetes mellitus with other diabetic kidney complication: Secondary | ICD-10-CM | POA: Diagnosis not present

## 2013-01-27 DIAGNOSIS — I10 Essential (primary) hypertension: Secondary | ICD-10-CM | POA: Diagnosis not present

## 2013-01-27 DIAGNOSIS — Z6837 Body mass index (BMI) 37.0-37.9, adult: Secondary | ICD-10-CM | POA: Diagnosis not present

## 2013-01-27 DIAGNOSIS — N184 Chronic kidney disease, stage 4 (severe): Secondary | ICD-10-CM | POA: Diagnosis not present

## 2013-01-31 DIAGNOSIS — N2581 Secondary hyperparathyroidism of renal origin: Secondary | ICD-10-CM | POA: Diagnosis not present

## 2013-01-31 DIAGNOSIS — N184 Chronic kidney disease, stage 4 (severe): Secondary | ICD-10-CM | POA: Diagnosis not present

## 2013-01-31 DIAGNOSIS — D509 Iron deficiency anemia, unspecified: Secondary | ICD-10-CM | POA: Diagnosis not present

## 2013-02-01 ENCOUNTER — Telehealth: Payer: Self-pay | Admitting: *Deleted

## 2013-02-01 DIAGNOSIS — R0609 Other forms of dyspnea: Secondary | ICD-10-CM | POA: Diagnosis not present

## 2013-02-01 DIAGNOSIS — N184 Chronic kidney disease, stage 4 (severe): Secondary | ICD-10-CM | POA: Diagnosis not present

## 2013-02-01 DIAGNOSIS — I5030 Unspecified diastolic (congestive) heart failure: Secondary | ICD-10-CM | POA: Diagnosis not present

## 2013-02-01 DIAGNOSIS — E119 Type 2 diabetes mellitus without complications: Secondary | ICD-10-CM | POA: Diagnosis not present

## 2013-02-01 DIAGNOSIS — I10 Essential (primary) hypertension: Secondary | ICD-10-CM | POA: Diagnosis not present

## 2013-02-01 NOTE — Telephone Encounter (Signed)
Pt called to cancel her appt for today and to r/s for 02/02/13 @ 9:30am. Then pt called to cancel her appt for 02/02/13 as i was typing. She will call back to es @ a later time.[name]

## 2013-02-02 DIAGNOSIS — E1139 Type 2 diabetes mellitus with other diabetic ophthalmic complication: Secondary | ICD-10-CM | POA: Diagnosis not present

## 2013-02-02 DIAGNOSIS — H35349 Macular cyst, hole, or pseudohole, unspecified eye: Secondary | ICD-10-CM | POA: Diagnosis not present

## 2013-02-02 DIAGNOSIS — H5460 Unqualified visual loss, one eye, unspecified: Secondary | ICD-10-CM | POA: Diagnosis not present

## 2013-02-02 DIAGNOSIS — H3340 Traction detachment of retina, unspecified eye: Secondary | ICD-10-CM | POA: Diagnosis not present

## 2013-02-02 DIAGNOSIS — H35379 Puckering of macula, unspecified eye: Secondary | ICD-10-CM | POA: Diagnosis not present

## 2013-02-02 DIAGNOSIS — E11359 Type 2 diabetes mellitus with proliferative diabetic retinopathy without macular edema: Secondary | ICD-10-CM | POA: Diagnosis not present

## 2013-02-03 DIAGNOSIS — Z1212 Encounter for screening for malignant neoplasm of rectum: Secondary | ICD-10-CM | POA: Diagnosis not present

## 2013-02-14 ENCOUNTER — Ambulatory Visit (HOSPITAL_BASED_OUTPATIENT_CLINIC_OR_DEPARTMENT_OTHER): Payer: BC Managed Care – PPO

## 2013-02-14 VITALS — BP 170/71 | HR 89 | Temp 98.8°F | Resp 20

## 2013-02-14 DIAGNOSIS — Z452 Encounter for adjustment and management of vascular access device: Secondary | ICD-10-CM

## 2013-02-14 DIAGNOSIS — C50419 Malignant neoplasm of upper-outer quadrant of unspecified female breast: Secondary | ICD-10-CM

## 2013-02-14 MED ORDER — SODIUM CHLORIDE 0.9 % IJ SOLN
10.0000 mL | INTRAMUSCULAR | Status: DC | PRN
  Administered 2013-02-14: 10 mL via INTRAVENOUS
  Filled 2013-02-14: qty 10

## 2013-02-14 MED ORDER — HEPARIN SOD (PORK) LOCK FLUSH 100 UNIT/ML IV SOLN
500.0000 [IU] | Freq: Once | INTRAVENOUS | Status: AC
  Administered 2013-02-14: 500 [IU] via INTRAVENOUS
  Filled 2013-02-14: qty 5

## 2013-02-15 ENCOUNTER — Encounter: Payer: Self-pay | Admitting: Oncology

## 2013-02-15 NOTE — Progress Notes (Signed)
Put disability form on nurse's desk. °

## 2013-02-16 ENCOUNTER — Encounter: Payer: Self-pay | Admitting: Oncology

## 2013-02-16 NOTE — Progress Notes (Signed)
Faxed disability form to [id number].

## 2013-02-18 DIAGNOSIS — N184 Chronic kidney disease, stage 4 (severe): Secondary | ICD-10-CM | POA: Diagnosis not present

## 2013-02-18 DIAGNOSIS — I10 Essential (primary) hypertension: Secondary | ICD-10-CM | POA: Diagnosis not present

## 2013-02-18 DIAGNOSIS — I5030 Unspecified diastolic (congestive) heart failure: Secondary | ICD-10-CM | POA: Diagnosis not present

## 2013-02-18 DIAGNOSIS — R0989 Other specified symptoms and signs involving the circulatory and respiratory systems: Secondary | ICD-10-CM | POA: Diagnosis not present

## 2013-02-18 DIAGNOSIS — E119 Type 2 diabetes mellitus without complications: Secondary | ICD-10-CM | POA: Diagnosis not present

## 2013-02-18 DIAGNOSIS — R0609 Other forms of dyspnea: Secondary | ICD-10-CM | POA: Diagnosis not present

## 2013-02-28 DIAGNOSIS — N184 Chronic kidney disease, stage 4 (severe): Secondary | ICD-10-CM | POA: Diagnosis not present

## 2013-03-04 ENCOUNTER — Other Ambulatory Visit (HOSPITAL_COMMUNITY): Payer: Self-pay | Admitting: *Deleted

## 2013-03-07 ENCOUNTER — Encounter (HOSPITAL_COMMUNITY): Payer: BC Managed Care – PPO

## 2013-03-07 DIAGNOSIS — E875 Hyperkalemia: Secondary | ICD-10-CM | POA: Diagnosis not present

## 2013-03-07 DIAGNOSIS — N184 Chronic kidney disease, stage 4 (severe): Secondary | ICD-10-CM | POA: Diagnosis not present

## 2013-03-11 ENCOUNTER — Other Ambulatory Visit (HOSPITAL_COMMUNITY): Payer: Self-pay | Admitting: *Deleted

## 2013-03-14 ENCOUNTER — Encounter (HOSPITAL_COMMUNITY)
Admission: RE | Admit: 2013-03-14 | Discharge: 2013-03-14 | Disposition: A | Discharge status: Home / Self Care | Payer: Medicare Other | Source: Ambulatory Visit | Attending: Nephrology | Admitting: Nephrology

## 2013-03-14 ENCOUNTER — Telehealth: Payer: Self-pay | Admitting: Oncology

## 2013-03-14 DIAGNOSIS — D509 Iron deficiency anemia, unspecified: Secondary | ICD-10-CM | POA: Diagnosis not present

## 2013-03-14 DIAGNOSIS — Z6837 Body mass index (BMI) 37.0-37.9, adult: Secondary | ICD-10-CM | POA: Diagnosis not present

## 2013-03-14 DIAGNOSIS — R252 Cramp and spasm: Secondary | ICD-10-CM | POA: Diagnosis not present

## 2013-03-14 DIAGNOSIS — I10 Essential (primary) hypertension: Secondary | ICD-10-CM | POA: Diagnosis not present

## 2013-03-14 DIAGNOSIS — I872 Venous insufficiency (chronic) (peripheral): Secondary | ICD-10-CM | POA: Diagnosis not present

## 2013-03-14 DIAGNOSIS — N184 Chronic kidney disease, stage 4 (severe): Secondary | ICD-10-CM | POA: Diagnosis not present

## 2013-03-14 DIAGNOSIS — M2550 Pain in unspecified joint: Secondary | ICD-10-CM | POA: Diagnosis not present

## 2013-03-14 DIAGNOSIS — E1129 Type 2 diabetes mellitus with other diabetic kidney complication: Secondary | ICD-10-CM | POA: Diagnosis not present

## 2013-03-14 DIAGNOSIS — R002 Palpitations: Secondary | ICD-10-CM | POA: Diagnosis not present

## 2013-03-14 DIAGNOSIS — I120 Hypertensive chronic kidney disease with stage 5 chronic kidney disease or end stage renal disease: Secondary | ICD-10-CM | POA: Insufficient documentation

## 2013-03-14 DIAGNOSIS — N186 End stage renal disease: Secondary | ICD-10-CM | POA: Insufficient documentation

## 2013-03-14 DIAGNOSIS — D631 Anemia in chronic kidney disease: Secondary | ICD-10-CM | POA: Insufficient documentation

## 2013-03-14 DIAGNOSIS — N289 Disorder of kidney and ureter, unspecified: Secondary | ICD-10-CM | POA: Diagnosis not present

## 2013-03-14 LAB — BASIC METABOLIC PANEL
BUN: 68 mg/dL — ABNORMAL HIGH (ref 6–23)
GFR calc non Af Amer: 6 mL/min — ABNORMAL LOW (ref >90)
Glucose, Bld: 278 mg/dL — ABNORMAL HIGH (ref 70–99)
Potassium: 4.5 mEq/L (ref 3.5–5.1)

## 2013-03-14 MED ORDER — EPOETIN ALFA 20000 UNIT/ML IJ SOLN
INTRAMUSCULAR | Status: AC
  Administered 2013-03-14: 20000 [IU] via SUBCUTANEOUS
  Filled 2013-03-14: qty 1

## 2013-03-14 MED ORDER — EPOETIN ALFA 20000 UNIT/ML IJ SOLN: 20000.0000 [IU] | INTRAMUSCULAR | Status: DC

## 2013-03-14 NOTE — Progress Notes (Signed)
[place] result phoned to Dr Mercy Moore per [facility] his [place].  She reports no further orders after speaking with the MD

## 2013-03-14 NOTE — Telephone Encounter (Signed)
, °

## 2013-03-15 DIAGNOSIS — I10 Essential (primary) hypertension: Secondary | ICD-10-CM | POA: Diagnosis not present

## 2013-03-15 DIAGNOSIS — N184 Chronic kidney disease, stage 4 (severe): Secondary | ICD-10-CM | POA: Diagnosis not present

## 2013-03-15 DIAGNOSIS — I5030 Unspecified diastolic (congestive) heart failure: Secondary | ICD-10-CM | POA: Diagnosis not present

## 2013-03-15 DIAGNOSIS — E119 Type 2 diabetes mellitus without complications: Secondary | ICD-10-CM | POA: Diagnosis not present

## 2013-03-24 DIAGNOSIS — E11311 Type 2 diabetes mellitus with unspecified diabetic retinopathy with macular edema: Secondary | ICD-10-CM | POA: Diagnosis not present

## 2013-03-24 DIAGNOSIS — E11359 Type 2 diabetes mellitus with proliferative diabetic retinopathy without macular edema: Secondary | ICD-10-CM | POA: Diagnosis not present

## 2013-03-24 DIAGNOSIS — E1139 Type 2 diabetes mellitus with other diabetic ophthalmic complication: Secondary | ICD-10-CM | POA: Diagnosis not present

## 2013-03-29 ENCOUNTER — Encounter (HOSPITAL_COMMUNITY)
Admission: RE | Admit: 2013-03-29 | Discharge: 2013-03-29 | Disposition: A | Discharge status: Home / Self Care | Payer: Medicare Other | Source: Ambulatory Visit | Attending: Nephrology | Admitting: Nephrology

## 2013-03-29 DIAGNOSIS — N184 Chronic kidney disease, stage 4 (severe): Secondary | ICD-10-CM | POA: Insufficient documentation

## 2013-03-29 DIAGNOSIS — D509 Iron deficiency anemia, unspecified: Secondary | ICD-10-CM | POA: Diagnosis not present

## 2013-03-29 DIAGNOSIS — I129 Hypertensive chronic kidney disease with stage 1 through stage 4 chronic kidney disease, or unspecified chronic kidney disease: Secondary | ICD-10-CM | POA: Insufficient documentation

## 2013-03-29 DIAGNOSIS — N289 Disorder of kidney and ureter, unspecified: Secondary | ICD-10-CM | POA: Diagnosis not present

## 2013-03-29 DIAGNOSIS — D631 Anemia in chronic kidney disease: Secondary | ICD-10-CM | POA: Insufficient documentation

## 2013-03-29 LAB — POCT HEMOGLOBIN-HEMACUE: Hemoglobin: 8.3 g/dL — ABNORMAL LOW (ref 12.0–15.0)

## 2013-03-29 LAB — IRON AND TIBC
Saturation Ratios: 17 % — ABNORMAL LOW (ref 20–55)
TIBC: 256 ug/dL (ref 250–470)

## 2013-03-29 LAB — FERRITIN: Ferritin: 309 ng/mL — ABNORMAL HIGH (ref 10–291)

## 2013-03-29 MED ORDER — EPOETIN ALFA 20000 UNIT/ML IJ SOLN: 20000.0000 [IU] | INTRAMUSCULAR | Status: DC

## 2013-03-29 MED ORDER — EPOETIN ALFA 20000 UNIT/ML IJ SOLN
INTRAMUSCULAR | Status: AC
  Administered 2013-03-29: 20000 [IU] via SUBCUTANEOUS
  Filled 2013-03-29: qty 1

## 2013-03-31 DIAGNOSIS — E1139 Type 2 diabetes mellitus with other diabetic ophthalmic complication: Secondary | ICD-10-CM | POA: Diagnosis not present

## 2013-03-31 DIAGNOSIS — E11311 Type 2 diabetes mellitus with unspecified diabetic retinopathy with macular edema: Secondary | ICD-10-CM | POA: Diagnosis not present

## 2013-03-31 DIAGNOSIS — E11359 Type 2 diabetes mellitus with proliferative diabetic retinopathy without macular edema: Secondary | ICD-10-CM | POA: Diagnosis not present

## 2013-04-07 ENCOUNTER — Other Ambulatory Visit: Payer: Self-pay | Admitting: Lab

## 2013-04-07 ENCOUNTER — Ambulatory Visit: Payer: Self-pay | Admitting: Oncology

## 2013-04-11 ENCOUNTER — Other Ambulatory Visit (HOSPITAL_COMMUNITY): Payer: Self-pay | Admitting: *Deleted

## 2013-04-12 ENCOUNTER — Encounter (HOSPITAL_COMMUNITY): Payer: Medicare Other

## 2013-04-13 DIAGNOSIS — N185 Chronic kidney disease, stage 5: Secondary | ICD-10-CM | POA: Diagnosis not present

## 2013-04-13 DIAGNOSIS — N2581 Secondary hyperparathyroidism of renal origin: Secondary | ICD-10-CM | POA: Diagnosis not present

## 2013-04-18 ENCOUNTER — Ambulatory Visit: Payer: Medicare Other

## 2013-04-18 ENCOUNTER — Ambulatory Visit (HOSPITAL_BASED_OUTPATIENT_CLINIC_OR_DEPARTMENT_OTHER): Payer: Medicare Other | Admitting: Lab

## 2013-04-18 ENCOUNTER — Ambulatory Visit (HOSPITAL_BASED_OUTPATIENT_CLINIC_OR_DEPARTMENT_OTHER): Payer: Medicare Other | Admitting: Oncology

## 2013-04-18 ENCOUNTER — Other Ambulatory Visit (HOSPITAL_COMMUNITY): Payer: Self-pay | Admitting: Nephrology

## 2013-04-18 ENCOUNTER — Telehealth: Payer: Self-pay | Admitting: Oncology

## 2013-04-18 ENCOUNTER — Other Ambulatory Visit: Payer: Self-pay | Admitting: Emergency Medicine

## 2013-04-18 ENCOUNTER — Encounter: Payer: Self-pay | Admitting: Oncology

## 2013-04-18 ENCOUNTER — Other Ambulatory Visit: Payer: Medicare Other

## 2013-04-18 VITALS — BP 172/82 | HR 92 | Temp 98.4°F | Resp 18 | Ht 65.0 in | Wt 231.1 lb

## 2013-04-18 DIAGNOSIS — C50219 Malignant neoplasm of upper-inner quadrant of unspecified female breast: Secondary | ICD-10-CM

## 2013-04-18 DIAGNOSIS — Z171 Estrogen receptor negative status [ER-]: Secondary | ICD-10-CM | POA: Diagnosis not present

## 2013-04-18 DIAGNOSIS — G8929 Other chronic pain: Secondary | ICD-10-CM | POA: Diagnosis not present

## 2013-04-18 DIAGNOSIS — E119 Type 2 diabetes mellitus without complications: Secondary | ICD-10-CM

## 2013-04-18 DIAGNOSIS — C50212 Malignant neoplasm of upper-inner quadrant of left female breast: Secondary | ICD-10-CM

## 2013-04-18 DIAGNOSIS — N289 Disorder of kidney and ureter, unspecified: Secondary | ICD-10-CM

## 2013-04-18 DIAGNOSIS — IMO0001 Reserved for inherently not codable concepts without codable children: Secondary | ICD-10-CM

## 2013-04-18 DIAGNOSIS — R0609 Other forms of dyspnea: Secondary | ICD-10-CM

## 2013-04-18 DIAGNOSIS — I10 Essential (primary) hypertension: Secondary | ICD-10-CM

## 2013-04-18 DIAGNOSIS — D649 Anemia, unspecified: Secondary | ICD-10-CM | POA: Diagnosis not present

## 2013-04-18 DIAGNOSIS — N185 Chronic kidney disease, stage 5: Secondary | ICD-10-CM

## 2013-04-18 LAB — COMPREHENSIVE METABOLIC PANEL (CC13)
ALT: 33 U/L (ref 0–55)
AST: 24 U/L (ref 5–34)
AST: 24 U/L (ref 5–34)
Albumin: 2.9 g/dL — ABNORMAL LOW (ref 3.5–5.0)
Albumin: 2.9 g/dL — ABNORMAL LOW (ref 3.5–5.0)
Alkaline Phosphatase: 101 U/L (ref 40–150)
BUN: 60.1 mg/dL — ABNORMAL HIGH (ref 7.0–26.0)
Calcium: 9.4 mg/dL (ref 8.4–10.4)
Chloride: 103 mEq/L (ref 98–109)
Potassium: 4.4 mEq/L (ref 3.5–5.1)
Potassium: 4.3 mEq/L (ref 3.5–5.1)
Sodium: 139 mEq/L (ref 136–145)
Total Bilirubin: 0.3 mg/dL (ref 0.20–1.20)

## 2013-04-18 LAB — CBC WITH DIFFERENTIAL/PLATELET
BASO%: 1.1 % (ref 0.0–2.0)
Basophils Absolute: 0 10*3/uL (ref 0.0–0.1)
EOS%: 3 % (ref 0.0–7.0)
EOS%: 2.9 % (ref 0.0–7.0)
HGB: 8.1 g/dL — ABNORMAL LOW (ref 11.6–15.9)
LYMPH%: 23.8 % (ref 14.0–49.7)
MCH: 27.4 pg (ref 25.1–34.0)
MCH: 27.4 pg (ref 25.1–34.0)
MCHC: 32.6 g/dL (ref 31.5–36.0)
MCV: 83.9 fL (ref 79.5–101.0)
MONO%: 9.2 % (ref 0.0–14.0)
RBC: 2.93 10*6/uL — ABNORMAL LOW (ref 3.70–5.45)
RDW: 15.1 % — ABNORMAL HIGH (ref 11.2–14.5)
RDW: 15.1 % — ABNORMAL HIGH (ref 11.2–14.5)
lymph#: 2.2 10*3/uL (ref 0.9–3.3)

## 2013-04-18 MED ORDER — SODIUM CHLORIDE 0.9 % IJ SOLN
10.0000 mL | INTRAMUSCULAR | Status: DC | PRN
  Administered 2013-04-18: 10 mL via INTRAVENOUS
  Filled 2013-04-18: qty 10

## 2013-04-18 MED ORDER — HEPARIN SOD (PORK) LOCK FLUSH 100 UNIT/ML IV SOLN
500.0000 [IU] | Freq: Once | INTRAVENOUS | Status: AC
  Administered 2013-04-18: 500 [IU] via INTRAVENOUS
  Filled 2013-04-18: qty 5

## 2013-04-18 NOTE — Telephone Encounter (Signed)
Pt given schedule for November and sent for lb/flush. S/w helen in inf - inf will draw lb. S/w lb - lb will gv the pt tubes and que her to flush.

## 2013-04-18 NOTE — Patient Instructions (Addendum)
We checked an erythropoeitin level today  We discussed the possiblility of starting you on procrit injections to help build up your red blood counts  Epoetin Alfa injection What is this medicine? EPOETIN ALFA (e POE e tin AL fa) helps your body make more red blood cells. This medicine is used to treat anemia caused by chronic kidney failure, cancer chemotherapy, or HIV-therapy. It may also be used before surgery if you have anemia. This medicine may be used for other purposes; ask your health care provider or pharmacist if you have questions. What should I tell my health care provider before I take this medicine? They need to know if you have any of these conditions: -blood clotting disorders -cancer patient not on chemotherapy -cystic fibrosis -heart disease, such as angina or heart failure -hemoglobin level of 12 g/dL or greater -high blood pressure -low levels of folate, iron, or vitamin B12 -seizures -an unusual or allergic reaction to erythropoietin, albumin, benzyl alcohol, hamster proteins, other medicines, foods, dyes, or preservatives -pregnant or trying to get pregnant -breast-feeding How should I use this medicine? This medicine is for injection into a vein or under the skin. It is usually given by a health care professional in a hospital or clinic setting. If you get this medicine at home, you will be taught how to prepare and give this medicine. Use exactly as directed. Take your medicine at regular intervals. Do not take your medicine more often than directed. It is important that you put your used needles and syringes in a special sharps container. Do not put them in a trash can. If you do not have a sharps container, call your pharmacist or healthcare provider to get one. Talk to your pediatrician regarding the use of this medicine in children. While this drug may be prescribed for selected conditions, precautions do apply. Overdosage: If you think you have taken too much of  this medicine contact a poison control center or emergency room at once. NOTE: This medicine is only for you. Do not share this medicine with others. What if I miss a dose? If you miss a dose, take it as soon as you can. If it is almost time for your next dose, take only that dose. Do not take double or extra doses. What may interact with this medicine? Do not take this medicine with any of the following medications: -darbepoetin alfa This list may not describe all possible interactions. Give your health care provider a list of all the medicines, herbs, non-prescription drugs, or dietary supplements you use. Also tell them if you smoke, drink alcohol, or use illegal drugs. Some items may interact with your medicine. What should I watch for while using this medicine? Visit your prescriber or health care professional for regular checks on your progress and for the needed blood tests and blood pressure measurements. It is especially important for the doctor to make sure your hemoglobin level is in the desired range, to limit the risk of potential side effects and to give you the best benefit. Keep all appointments for any recommended tests. Check your blood pressure as directed. Ask your doctor what your blood pressure should be and when you should contact him or her. As your body makes more red blood cells, you may need to take iron, folic acid, or vitamin B supplements. Ask your doctor or health care provider which products are right for you. If you have kidney disease continue dietary restrictions, even though this medication can make you feel  better. Talk with your doctor or health care professional about the foods you eat and the vitamins that you take. What side effects may I notice from receiving this medicine? Side effects that you should report to your doctor or health care professional as soon as possible: -allergic reactions like skin rash, itching or hives, swelling of the face, lips, or  tongue -breathing problems -changes in vision -chest pain -confusion, trouble speaking or understanding -feeling faint or lightheaded, falls -high blood pressure -muscle aches or pains -pain, swelling, warmth in the leg -rapid weight gain -severe headaches -sudden numbness or weakness of the face, arm or leg -trouble walking, dizziness, loss of balance or coordination -seizures (convulsions) -swelling of the ankles, feet, hands -unusually weak or tired Side effects that usually do not require medical attention (report to your doctor or health care professional if they continue or are bothersome): -diarrhea -fever, chills (flu-like symptoms) -headaches -nausea, vomiting -redness, stinging, or swelling at site where injected This list may not describe all possible side effects. Call your doctor for medical advice about side effects. You may report side effects to FDA at 1-800-FDA-1088. Where should I keep my medicine? Keep out of the reach of children. Store in a refrigerator between 2 and 8 degrees C (36 and 46 degrees F). Do not freeze or shake. Throw away any unused portion if using a single-dose vial. Multi-dose vials can be kept in the refrigerator for up to 21 days after the initial dose. Throw away unused medicine. NOTE: This sheet is a summary. It may not cover all possible information. If you have questions about this medicine, talk to your doctor, pharmacist, or health care provider.  2013, [employer]. (06/20/2008 10:25:44 AM)

## 2013-04-18 NOTE — Progress Notes (Signed)
OFFICE PROGRESS NOTE  CC Dr. Fanny Skates Dr. Evette Cristal Dr. Berneta Sages  DIAGNOSIS: -year-old female with stage I breast cancer.  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 She underwent a lumpectomy with sentinel node dissection.  #2 she then received 4 cycles of dose dense FEC 100 and 12 weeks of single agent Taxol given adjuvantly.  #3 she then went on to receive radiation therapy to the left breast. All of her therapy was completed in October 2012  #4 bone scan performed on 12/22/2011 no evidence of metastatic disease a significant amount of arthritis.  #5 renal insufficiency: Patient is being seen by nephrology.  #6 anemia: Due to chronic disease and renal insufficiency  CURRENT THERAPY:Observation  INTERVAL HISTORY: [name] 62 y.o. female returns for Followup visit. From breast cancer perspective patient is doing well she has no evidence of recurrent disease. She has healed very well in the left breast. She denies any nausea or vomiting no fevers chills or night sweats. Patient does have ongoing arthritic pains. Patient unfortunately he also has developed significant renal insufficiency. She is being seen by nephrology. Patient is also anemic she is short of breath on exertion sometimes on resting. She also complains of having some difficulty in urination. She's been anemic has had a workup performed. Today her creatinine is worse in comparison to previous numbers. Patient and I discussed the possibility of starting her on erythropoietin injections t in the form of Procrit. Hopefully these will help.  MEDICAL HISTORY: Past Medical History  Diagnosis Date  . Diabetes mellitus   . Arthritis   . Hypertension   . GERD (gastroesophageal reflux disease)   . Anemia   . Myalgia 12/31/2011  . Neuropathy 12/31/2011    ALLERGIES:  has No Known Allergies.  MEDICATIONS:  Current Outpatient  Prescriptions  Medication Sig Dispense Refill  . aspirin 81 MG tablet Take 81 mg by mouth daily.        Marland Kitchen docusate sodium (COLACE) 100 MG capsule Take 100 mg by mouth 2 (two) times daily.        Marland Kitchen HYDROcodone-acetaminophen (VICODIN) 5-500 MG per tablet Take 1 tablet by mouth every 6 (six) hours as needed.  60 tablet  0  . Insulin Isophane & Regular (HUMULIN 70/30 [place]) Inject into the skin.        . IRON PO Take by mouth.        Marland Kitchen lisinopril (PRINIVIL,ZESTRIL) 10 MG tablet Take 10 mg by mouth daily.        . Magnesium Hydroxide (MAGNESIA PO) Take by mouth daily.      Marland Kitchen METFORMIN HCL PO Take by mouth.        . Multiple Vitamin (MULTIVITAMIN) tablet Take 1 tablet by mouth daily.        Marland Kitchen omeprazole (PRILOSEC) 10 MG capsule Take 10 mg by mouth daily.        . ondansetron (ZOFRAN) 4 MG tablet Take 4 mg by mouth every 8 (eight) hours as needed.         No current facility-administered medications for this visit.    SURGICAL HISTORY:  Past Surgical History  Procedure Laterality Date  . Abdominal hysterectomy    . Cesarean section    . Eye surgery    . Tonsillectomy      REVIEW OF SYSTEMS:  Pertinent items are noted in HPI.   PHYSICAL EXAMINATION:  Well-developed well-nourished  female in no acute distress. HEENT exam EOMI PERRLA sclerae anicteric no conjunctival pallor oral mucosa is moist neck is supple lungs are clear bilaterally to auscultation and percussion cardiovascular is regular rate rhythm no murmurs gallops or rubs abdomen is soft nontender nondistended bowel sounds are present no HSM extremities no clubbing edema or cyanosis neuro patient's alert oriented otherwise nonfocal. Bilateral breast examination right breast without any masses nipple discharge or skin changes. Left breast reveals a well-healed surgical scar, it is barely visible. There are no dominant masses no nipple discharge.  ECOG PERFORMANCE STATUS: 1 - Symptomatic but completely ambulatory  Blood pressure 172/82,  pulse 92, temperature 98.4 F (36.9 C), temperature source Oral, resp. rate 18, height 5\' 5"  (1.651 m), weight 231 lb 1.6 oz (104.826 kg).  LABORATORY DATA: Lab Results  Component Value Date   WBC 7.9 04/18/2013   HGB 8.1* 04/18/2013   HCT 24.8* 04/18/2013   MCV 84.0 04/18/2013   PLT 372 04/18/2013      Chemistry      Component Value Date/Time   NA 138 03/14/2013 1314   NA 141 10/01/2012 1052   K 4.5 03/14/2013 1314   K 3.8 10/01/2012 1052   CL 100 03/14/2013 1314   CL 110* 10/01/2012 1052   CO2 22 03/14/2013 1314   CO2 23 10/01/2012 1052   BUN 68* 03/14/2013 1314   BUN 25.6 10/01/2012 1052   CREATININE 6.83* 03/14/2013 1314   CREATININE 2.2* 10/01/2012 1052   GLU 98 12/13/2010 1108      Component Value Date/Time   CALCIUM 9.0 03/14/2013 1314   CALCIUM 8.9 10/01/2012 1052   ALKPHOS 92 10/01/2012 1052   ALKPHOS 77 11/24/2011 0940   AST 15 10/01/2012 1052   AST 19 11/24/2011 0940   ALT 21 10/01/2012 1052   ALT 28 11/24/2011 0940   BILITOT 0.22 10/01/2012 1052   BILITOT 0.3 11/24/2011 0940       RADIOGRAPHIC STUDIES: NUCLEAR MEDICINE WHOLE BODY BONE SCINTIGRAPHY  Technique: Whole body anterior and posterior images were obtained  approximately 3 hours after intravenous injection of  radiopharmaceutical.  Radiopharmaceutical: 25MILLI CURIE TC-MDP TECHNETIUM TC 32M  MEDRONATE IV KIT  Comparison: None  Radiographic correlation: Pelvic radiograph 04/28/2011, chest CT  10/19/2010  Findings:  Uptake at the shoulders, wrists, knees, and feet, typically  degenerative or arthritic. Uptake at greater trochanteric region  right femur, uncertain etiology.  No definite radiographic abnormalities are seen at this site to  account for the abnormal bone scan.  Subtle uptake at the left lateral aspect of mid to lower thoracic  spine, corresponding to the endplate spur formation on chest CT.  Uptake identified in the lower cervical spine near the  cervicothoracic junction at midline to left, nonspecific  but  potentially degenerative in origin.  No other sites of abnormal osseous tracer localization identified.  Expected urinary tract and soft tissue distribution of tracer.  IMPRESSION:  Abnormal uptake right femur at the greater trochanteric region  laterally, of uncertain etiology.  Abnormal uptake in lower cervical spine near the cervicothoracic  junction.  Radiographic correlation of these sites recommended.  No other abnormal sites of osseous tracer accumulation identified.  Original Report Authenticated By: Burnetta Sabin, M.D.   ASSESSMENT: 62 year old female with:  1.  triple negative invasive ductal carcinoma of the left breast diagnosed in 2012. She is status post lumpectomy with sentinel node biopsy for a stage I disease. She then went on to receive adjuvant chemotherapy consisting of  dose dense FEC 100 for 4 cycles followed by 12 weeks of single agent Taxol. Overall she tolerated the chemotherapy relatively well. She also completed postlumpectomy radiation therapy in October 2012. Since then she has been on observation only.  #2 patient had a bone scan performed that did not reveal any evidence of metastatic disease but she is noted to have significant amount of arthritis.    #3 musculoskeletal pain chronic do to her arthritis and possibly peripheral neuropathies.  #4 chronic anemia with  renal insufficiency followed by nephrology.  #5 we discussed getting erythropoietin level and possibly starting her on Procrit.  PLAN:  #1 obtain erythropoietin level.  #2 I will see her back in 2 months time.  #3 I have encouraged the patient to continue to be seen by nephrology and to begin hemodialysis is recommended.    All questions were answered. The patient knows to call the clinic with any problems, questions or concerns. We can certainly see the patient much sooner if necessary.  I spent 25 minutes counseling the patient face to face. The total time spent in the appointment  was 30 minutes.    Marcy Panning, MD Medical/Oncology [facility] [phone] (beeper) [phone] (Office)  04/18/2013, 11:17 AM

## 2013-04-20 ENCOUNTER — Other Ambulatory Visit: Payer: Self-pay | Admitting: Emergency Medicine

## 2013-04-20 ENCOUNTER — Encounter: Payer: Self-pay | Admitting: Oncology

## 2013-04-20 ENCOUNTER — Encounter (HOSPITAL_COMMUNITY): Payer: Self-pay | Admitting: Pharmacy Technician

## 2013-04-20 ENCOUNTER — Other Ambulatory Visit: Payer: Self-pay | Admitting: Radiology

## 2013-04-20 NOTE — Progress Notes (Signed)
Put disability form on nurse's desk. °

## 2013-04-21 ENCOUNTER — Ambulatory Visit (HOSPITAL_COMMUNITY)
Admission: RE | Admit: 2013-04-21 | Discharge: 2013-04-21 | Disposition: A | Discharge status: Home / Self Care | Payer: Medicare Other | Source: Ambulatory Visit | Attending: Nephrology | Admitting: Nephrology

## 2013-04-21 ENCOUNTER — Encounter (HOSPITAL_COMMUNITY): Payer: Self-pay

## 2013-04-21 ENCOUNTER — Encounter: Payer: Self-pay | Admitting: Oncology

## 2013-04-21 DIAGNOSIS — N186 End stage renal disease: Secondary | ICD-10-CM | POA: Diagnosis not present

## 2013-04-21 DIAGNOSIS — E1129 Type 2 diabetes mellitus with other diabetic kidney complication: Secondary | ICD-10-CM | POA: Diagnosis not present

## 2013-04-21 DIAGNOSIS — Z992 Dependence on renal dialysis: Secondary | ICD-10-CM | POA: Insufficient documentation

## 2013-04-21 DIAGNOSIS — I120 Hypertensive chronic kidney disease with stage 5 chronic kidney disease or end stage renal disease: Secondary | ICD-10-CM | POA: Diagnosis not present

## 2013-04-21 DIAGNOSIS — N185 Chronic kidney disease, stage 5: Secondary | ICD-10-CM

## 2013-04-21 DIAGNOSIS — I10 Essential (primary) hypertension: Secondary | ICD-10-CM

## 2013-04-21 DIAGNOSIS — N269 Renal sclerosis, unspecified: Secondary | ICD-10-CM | POA: Diagnosis not present

## 2013-04-21 DIAGNOSIS — N289 Disorder of kidney and ureter, unspecified: Secondary | ICD-10-CM | POA: Diagnosis not present

## 2013-04-21 DIAGNOSIS — N058 Unspecified nephritic syndrome with other morphologic changes: Secondary | ICD-10-CM | POA: Insufficient documentation

## 2013-04-21 DIAGNOSIS — E119 Type 2 diabetes mellitus without complications: Secondary | ICD-10-CM

## 2013-04-21 DIAGNOSIS — K219 Gastro-esophageal reflux disease without esophagitis: Secondary | ICD-10-CM | POA: Insufficient documentation

## 2013-04-21 LAB — CBC
MCH: 26.8 pg (ref 26.0–34.0)
Platelets: 342 10*3/uL (ref 150–400)
RBC: 3.02 MIL/uL — ABNORMAL LOW (ref 3.87–5.11)

## 2013-04-21 LAB — PROTIME-INR
INR: 1.02 (ref 0.00–1.49)
Prothrombin Time: 13.2 seconds (ref 11.6–15.2)

## 2013-04-21 MED ORDER — MIDAZOLAM HCL 2 MG/2ML IJ SOLN
INTRAMUSCULAR | Status: AC | PRN
  Administered 2013-04-21 (×2): 2 mg via INTRAVENOUS

## 2013-04-21 MED ORDER — FENTANYL CITRATE 0.05 MG/ML IJ SOLN
INTRAMUSCULAR | Status: AC
  Filled 2013-04-21: qty 4

## 2013-04-21 MED ORDER — FENTANYL CITRATE 0.05 MG/ML IJ SOLN
INTRAMUSCULAR | Status: AC | PRN
  Administered 2013-04-21 (×2): 50 ug via INTRAVENOUS
  Administered 2013-04-21: 100 ug via INTRAVENOUS

## 2013-04-21 MED ORDER — MIDAZOLAM HCL 2 MG/2ML IJ SOLN
INTRAMUSCULAR | Status: AC
  Filled 2013-04-21: qty 6

## 2013-04-21 MED ORDER — SODIUM CHLORIDE 0.9 % IV SOLN
Freq: Once | INTRAVENOUS | Status: AC
  Administered 2013-04-21: 10:00:00 via INTRAVENOUS

## 2013-04-21 NOTE — H&P (Signed)
[name] is an 62 y.o. female.   Chief Complaint: Chronic kidney disease- stage 5 Worsening renal functions Scheduled for random renal biopsy per Dr Mercy Moore Pt with Hx Breast Ca HPI: DM; HTN; CKD; GERD; breast ca  Past Medical History  Diagnosis Date  . Diabetes mellitus   . Arthritis   . Hypertension   . GERD (gastroesophageal reflux disease)   . Anemia   . Myalgia 12/31/2011  . Neuropathy 12/31/2011    Past Surgical History  Procedure Laterality Date  . Abdominal hysterectomy    . Cesarean section    . Eye surgery    . Tonsillectomy      Family History  Problem Relation Age of Onset  . Diabetes Mother   . Hyperlipidemia Mother    Social History:  reports that she has never smoked. She has never used smokeless tobacco. She reports that she does not drink alcohol or use illicit drugs.  Allergies: No Known Allergies   (Not in a hospital admission)  No results found for this or any previous visit (from the past 48 hour(s)). No results found.  Review of Systems  Constitutional: Negative for fever.  Respiratory: Negative for shortness of breath.   Cardiovascular: Negative for chest pain.  Gastrointestinal: Negative for nausea and vomiting.  Neurological: Positive for dizziness and weakness. Negative for headaches.    Blood pressure 177/85, pulse 71, temperature 98.5 F (36.9 C), temperature source Oral, resp. rate 18, height 5\' 3"  (1.6 m), weight 172 lb (78.019 kg), SpO2 97.00%. Physical Exam  Constitutional: She is oriented to person, place, and time. She appears well-nourished.  Cardiovascular: Normal rate and regular rhythm.   Murmur heard. Respiratory: Effort normal and breath sounds normal. She has no wheezes.  GI: Soft. Bowel sounds are normal. There is no tenderness.  Musculoskeletal: Normal range of motion.  Neurological: She is alert and oriented to person, place, and time.  Skin: Skin is warm and dry.  Psychiatric: She has a normal mood and  affect. Her behavior is normal. Judgment and thought content normal.     Assessment/Plan CKD stage 5 Worsening renal functions Scheduled for random renal bx pt aware of procedure benefits and risks and agreeable to proceed consent signed and in chart   Tim[place]lake A 04/21/2013, 9:20 AM

## 2013-04-21 NOTE — Procedures (Signed)
R kidney random Bx 16 gauge times three No comp

## 2013-04-21 NOTE — ED Notes (Signed)
Patient denies pain and is resting comfortably with sister at bed side

## 2013-04-21 NOTE — Progress Notes (Signed)
Faxed disability form to [phone]

## 2013-04-22 ENCOUNTER — Telehealth: Payer: Self-pay | Admitting: *Deleted

## 2013-04-22 NOTE — Telephone Encounter (Signed)
Returned pt's call regarding Disability paperwork. Notified pt this was faxed on 10/2

## 2013-04-26 ENCOUNTER — Ambulatory Visit (HOSPITAL_COMMUNITY): Payer: Medicare Other

## 2013-04-27 DIAGNOSIS — Z6838 Body mass index (BMI) 38.0-38.9, adult: Secondary | ICD-10-CM | POA: Diagnosis not present

## 2013-04-27 DIAGNOSIS — I10 Essential (primary) hypertension: Secondary | ICD-10-CM | POA: Diagnosis not present

## 2013-04-27 DIAGNOSIS — N184 Chronic kidney disease, stage 4 (severe): Secondary | ICD-10-CM | POA: Diagnosis not present

## 2013-04-27 DIAGNOSIS — E1129 Type 2 diabetes mellitus with other diabetic kidney complication: Secondary | ICD-10-CM | POA: Diagnosis not present

## 2013-04-28 ENCOUNTER — Other Ambulatory Visit: Payer: Self-pay | Admitting: Emergency Medicine

## 2013-04-28 MED ORDER — LIDOCAINE-PRILOCAINE 2.5-2.5 % EX CREA: TOPICAL_CREAM | CUTANEOUS | Status: DC | PRN

## 2013-04-29 DIAGNOSIS — N184 Chronic kidney disease, stage 4 (severe): Secondary | ICD-10-CM | POA: Diagnosis not present

## 2013-04-29 DIAGNOSIS — C50919 Malignant neoplasm of unspecified site of unspecified female breast: Secondary | ICD-10-CM | POA: Diagnosis not present

## 2013-04-29 DIAGNOSIS — I10 Essential (primary) hypertension: Secondary | ICD-10-CM | POA: Diagnosis not present

## 2013-04-29 DIAGNOSIS — D509 Iron deficiency anemia, unspecified: Secondary | ICD-10-CM | POA: Diagnosis not present

## 2013-04-29 DIAGNOSIS — Z6838 Body mass index (BMI) 38.0-38.9, adult: Secondary | ICD-10-CM | POA: Diagnosis not present

## 2013-04-29 DIAGNOSIS — E1165 Type 2 diabetes mellitus with hyperglycemia: Secondary | ICD-10-CM | POA: Diagnosis not present

## 2013-05-11 DIAGNOSIS — N184 Chronic kidney disease, stage 4 (severe): Secondary | ICD-10-CM | POA: Diagnosis not present

## 2013-05-11 DIAGNOSIS — E1129 Type 2 diabetes mellitus with other diabetic kidney complication: Secondary | ICD-10-CM | POA: Diagnosis not present

## 2013-05-11 DIAGNOSIS — Z6836 Body mass index (BMI) 36.0-36.9, adult: Secondary | ICD-10-CM | POA: Diagnosis not present

## 2013-05-11 DIAGNOSIS — I10 Essential (primary) hypertension: Secondary | ICD-10-CM | POA: Diagnosis not present

## 2013-05-26 DIAGNOSIS — E1139 Type 2 diabetes mellitus with other diabetic ophthalmic complication: Secondary | ICD-10-CM | POA: Diagnosis not present

## 2013-05-26 DIAGNOSIS — E11349 Type 2 diabetes mellitus with severe nonproliferative diabetic retinopathy without macular edema: Secondary | ICD-10-CM | POA: Diagnosis not present

## 2013-05-26 DIAGNOSIS — E11359 Type 2 diabetes mellitus with proliferative diabetic retinopathy without macular edema: Secondary | ICD-10-CM | POA: Diagnosis not present

## 2013-05-30 DIAGNOSIS — D631 Anemia in chronic kidney disease: Secondary | ICD-10-CM | POA: Diagnosis not present

## 2013-05-30 DIAGNOSIS — N184 Chronic kidney disease, stage 4 (severe): Secondary | ICD-10-CM | POA: Diagnosis not present

## 2013-05-30 DIAGNOSIS — I129 Hypertensive chronic kidney disease with stage 1 through stage 4 chronic kidney disease, or unspecified chronic kidney disease: Secondary | ICD-10-CM | POA: Diagnosis not present

## 2013-05-30 DIAGNOSIS — N2581 Secondary hyperparathyroidism of renal origin: Secondary | ICD-10-CM | POA: Diagnosis not present

## 2013-05-30 DIAGNOSIS — N185 Chronic kidney disease, stage 5: Secondary | ICD-10-CM | POA: Diagnosis not present

## 2013-05-31 DIAGNOSIS — Z9189 Other specified personal risk factors, not elsewhere classified: Secondary | ICD-10-CM | POA: Diagnosis not present

## 2013-05-31 DIAGNOSIS — N959 Unspecified menopausal and perimenopausal disorder: Secondary | ICD-10-CM | POA: Diagnosis not present

## 2013-05-31 DIAGNOSIS — Z130 Encounter for screening for diseases of the blood and blood-forming organs and certain disorders involving the immune mechanism: Secondary | ICD-10-CM | POA: Diagnosis not present

## 2013-05-31 DIAGNOSIS — Z1212 Encounter for screening for malignant neoplasm of rectum: Secondary | ICD-10-CM | POA: Diagnosis not present

## 2013-05-31 DIAGNOSIS — N760 Acute vaginitis: Secondary | ICD-10-CM | POA: Diagnosis not present

## 2013-05-31 DIAGNOSIS — Z124 Encounter for screening for malignant neoplasm of cervix: Secondary | ICD-10-CM | POA: Diagnosis not present

## 2013-06-02 ENCOUNTER — Other Ambulatory Visit: Payer: Self-pay | Admitting: *Deleted

## 2013-06-02 DIAGNOSIS — N186 End stage renal disease: Secondary | ICD-10-CM

## 2013-06-02 DIAGNOSIS — Z01810 Encounter for preprocedural cardiovascular examination: Secondary | ICD-10-CM

## 2013-06-03 ENCOUNTER — Ambulatory Visit (INDEPENDENT_AMBULATORY_CARE_PROVIDER_SITE_OTHER)
Admission: RE | Admit: 2013-06-03 | Discharge: 2013-06-03 | Disposition: A | Discharge status: Home / Self Care | Payer: Medicare Other | Source: Ambulatory Visit | Attending: Vascular Surgery | Admitting: Vascular Surgery

## 2013-06-03 ENCOUNTER — Ambulatory Visit (INDEPENDENT_AMBULATORY_CARE_PROVIDER_SITE_OTHER): Payer: Medicare Other | Admitting: Vascular Surgery

## 2013-06-03 ENCOUNTER — Encounter (HOSPITAL_COMMUNITY): Payer: Self-pay | Admitting: Pharmacy Technician

## 2013-06-03 ENCOUNTER — Ambulatory Visit (HOSPITAL_COMMUNITY)
Admission: RE | Admit: 2013-06-03 | Discharge: 2013-06-03 | Disposition: A | Discharge status: Home / Self Care | Payer: Medicare Other | Source: Ambulatory Visit | Attending: Vascular Surgery | Admitting: Vascular Surgery

## 2013-06-03 ENCOUNTER — Encounter: Payer: Self-pay | Admitting: Vascular Surgery

## 2013-06-03 ENCOUNTER — Other Ambulatory Visit: Payer: Self-pay

## 2013-06-03 ENCOUNTER — Encounter (HOSPITAL_COMMUNITY): Payer: Self-pay | Admitting: *Deleted

## 2013-06-03 VITALS — BP 154/91 | HR 66 | Ht 63.0 in | Wt 223.0 lb

## 2013-06-03 DIAGNOSIS — N186 End stage renal disease: Secondary | ICD-10-CM

## 2013-06-03 DIAGNOSIS — Z01810 Encounter for preprocedural cardiovascular examination: Secondary | ICD-10-CM | POA: Diagnosis not present

## 2013-06-03 DIAGNOSIS — N184 Chronic kidney disease, stage 4 (severe): Secondary | ICD-10-CM | POA: Insufficient documentation

## 2013-06-03 NOTE — Progress Notes (Signed)
[facility]  Referred by:  Tivis Ringer, MD [facility] [place], [place] [address]  Reason for referral: New access  History of Present Illness  [name] is a 62 y.o. (1951-01-23) female who presents for evaluation for permanent access.  The patient is right hand dominant.  The patient has had a R mediport placed previously.  The patient has never had a PPM placed but had a Left ax-dissection per pt.  Past Medical History  Diagnosis Date  . Diabetes mellitus   . Arthritis   . Hypertension   . GERD (gastroesophageal reflux disease)   . Anemia   . Myalgia 12/31/2011  . Neuropathy 12/31/2011  . Cancer   . Chronic kidney disease     Past Surgical History  Procedure Laterality Date  . Abdominal hysterectomy    . Cesarean section    . Eye surgery    . Tonsillectomy      History   Social History  . Marital Status: Single    Spouse Name: N/A    Number of Children: N/A  . Years of Education: N/A   Occupational History  . Not on file.   Social History Main Topics  . Smoking status: Never Smoker   . Smokeless tobacco: Never Used  . Alcohol Use: No  . Drug Use: No  . Sexual Activity: Not Currently   Other Topics Concern  . Not on file   Social History Narrative  . No narrative on file    Family History  Problem Relation Age of Onset  . Diabetes Mother   . Hyperlipidemia Mother   . Hypertension Mother   . Hypertension Father   . Diabetes Sister   . Diabetes Brother   . Hypertension Brother   . Heart attack Brother     Current Outpatient Prescriptions on File Prior to Visit  Medication Sig Dispense Refill  . amLODipine (NORVASC) 10 MG tablet Take 10 mg by mouth daily.      Marland Kitchen aspirin 81 MG tablet Take 81 mg by mouth daily.        . calcitRIOL (ROCALTROL) 0.25 MCG capsule Take 0.25-0.5 mcg by mouth daily. Patient takes 2 tablets on even days and one tablet on odd days.      .  furosemide (LASIX) 80 MG tablet Take 80 mg by mouth 2 (two) times daily.      . insulin glargine (LANTUS) 100 UNIT/ML injection Inject 50 Units into the skin daily.       . insulin lispro (HUMALOG KWIKPEN) 100 UNIT/ML SOPN Inject 12-16 Units into the skin 2 (two) times daily. SSI      . IRON PO Take 1 tablet by mouth daily.       Marland Kitchen lidocaine-prilocaine (EMLA) cream Apply topically as needed.  30 g  0  . omeprazole (PRILOSEC) 20 MG capsule Take 20 mg by mouth daily.      Marland Kitchen OVER THE COUNTER MEDICATION Take 2 capsules by mouth daily. Stool [place]      . traMADol (ULTRAM) 50 MG tablet Take 50 mg by mouth every 8 (eight) hours as needed for pain.       No current facility-administered medications on file prior to visit.    No Known Allergies   REVIEW OF SYSTEMS:  (Positives checked otherwise negative)  CARDIOVASCULAR:  []  chest pain, []  chest pressure, []  palpitations, [x]  shortness of breath when laying flat, []  shortness of breath with  exertion,  [x]  pain in feet when walking, [x]  pain in feet when laying flat, []  history of blood clot in veins (DVT), []  history of phlebitis, []  swelling in legs, []  varicose veins  PULMONARY:  []  productive cough, []  asthma, []  wheezing  NEUROLOGIC:  [x]  weakness in arms or legs, []  numbness in arms or legs, []  difficulty speaking or slurred speech, []  temporary loss of vision in one eye, []  dizziness  HEMATOLOGIC:  []  bleeding problems, []  problems with blood clotting too easily  MUSCULOSKEL:  []  joint pain, []  joint swelling  GASTROINTEST:  []  vomiting blood, []  blood in stool     GENITOURINARY:  [x]  burning with urination, []  blood in urine  PSYCHIATRIC:  []  history of major depression  INTEGUMENTARY:  []  rashes, []  ulcers  CONSTITUTIONAL:  []  fever, []  chills  Physical Examination  Filed Vitals:   06/03/13 1246  BP: 154/91  Pulse: 66  Height: 5\' 3"  (1.6 m)  Weight: 223 lb (101.152 kg)  SpO2: 97%   Body mass index is 39.51  kg/(m^2).  General: A&O x 3, WD, obese  Head: [place]/AT  Ear/Nose/Throat: Hearing grossly intact, nares w/o erythema or drainage, oropharynx w/o Erythema/Exudate, Mallampati score: 3  Eyes: PERRLA, EOMI  Neck: Supple, no nuchal rigidity, no palpable LAD  Pulmonary: Sym exp, good air movt, CTAB, no rales, rhonchi, & wheezing, palpable R chest mediport  Cardiac: RRR, Nl S1, S2, no Murmurs, rubs or gallops  Vascular: Vessel Right Left  Radial Palpable Palpable  Ulnar Faintly Palpable Faintly Palpable  Brachial Palpable Palpable  Carotid Palpable, without bruit Palpable, without bruit  Aorta Not palpable N/A  Femoral Palpable Palpable  Popliteal Not palpable Not palpable  PT Palpable Palpable  DP Palpable Palpable   Gastrointestinal: soft, NTND, -G/R, - HSM, - masses, - CVAT B  Musculoskeletal: M/S 5/5 throughout , Extremities without ischemic changes , visible R cephalic vein at antecubitum  Neurologic: CN 2-12 intact , Pain and light touch intact in extremities , Motor exam as listed above  Psychiatric: Judgment intact, Mood & affect appropriate for pt's clinical situation  Dermatologic: See M/S exam for extremity exam, no rashes otherwise noted  Lymph : No Cervical, Axillary, or Inguinal lymphadenopathy   Non-Invasive Vascular Imaging  Vein Mapping  (Date: 06/03/2013):   R arm: acceptable vein conduits include upper arm cephalic and basilic vein  L arm: acceptable vein conduits include upper arm basilic vein  Outside Studies/Documentation 2 outside pages of outside documents were reviewed including: outside nephrology chart.  Laboratory: CBC:    Component Value Date/Time   WBC 7.7 04/21/2013 0909   WBC 7.2 04/18/2013 1140   RBC 3.02* 04/21/2013 0909   RBC 2.93* 04/18/2013 1140   HGB 8.1* 04/21/2013 0909   HGB 8.0* 04/18/2013 1140   HCT 25.4* 04/21/2013 0909   HCT 24.6* 04/18/2013 1140   PLT 342 04/21/2013 0909   PLT 362 04/18/2013 1140   MCV 84.1 04/21/2013 0909    MCV 83.9 04/18/2013 1140   MCH 26.8 04/21/2013 0909   MCH 27.4 04/18/2013 1140   MCHC 31.9 04/21/2013 0909   MCHC 32.7 04/18/2013 1140   RDW 14.8 04/21/2013 0909   RDW 15.1* 04/18/2013 1140   LYMPHSABS 1.7 04/18/2013 1140   LYMPHSABS 2.4 10/20/2010 0400   MONOABS 0.7 04/18/2013 1140   MONOABS 1.3* 10/20/2010 0400   EOSABS 0.2 04/18/2013 1140   EOSABS 0.1 10/20/2010 0400   BASOSABS 0.0 04/18/2013 1140   BASOSABS 0.1 10/20/2010 0400  BMP:    Component Value Date/Time   NA 138 04/18/2013 1140   NA 138 03/14/2013 1314   K 4.3 04/18/2013 1140   K 4.5 03/14/2013 1314   CL 100 03/14/2013 1314   CL 110* 10/01/2012 1052   CO2 22 04/18/2013 1140   CO2 22 03/14/2013 1314   GLUCOSE 169* 04/18/2013 1140   GLUCOSE 278* 03/14/2013 1314   GLUCOSE 101* 10/01/2012 1052   BUN 60.1* 04/18/2013 1140   BUN 68* 03/14/2013 1314   CREATININE 7.4* 04/18/2013 1140   CREATININE 6.83* 03/14/2013 1314   CALCIUM 9.2 04/18/2013 1140   CALCIUM 9.0 03/14/2013 1314   GFRNONAA 6* 03/14/2013 1314   GFRAA 7* 03/14/2013 1314    Coagulation: Lab Results  Component Value Date   INR 1.02 04/21/2013   No results found for this basename: PTT   Medical Decision Making  [name] is a 62 y.o. female who presents with chronic kidney disease stage IV with imminent ESRD requiring hemodialysis.   Based on vein mapping and examination, this patient's permanent access options include: R BC AVF vs BVT.   I would avoid the L arm if it is true she has had a L ax-dissection.  The RIJ mediport also needs to be removed.  Hopefully the outflow isn't already damaged by the prior port presence.  I had an extensive discussion with this patient in regards to the nature of access surgery, including risk, benefits, and alternatives.    The patient is aware that the risks of access surgery include but are not limited to: bleeding, infection, steal syndrome, nerve damage, ischemic monomelic neuropathy, failure of access to mature, and possible need for  additional access procedures in the future.  The patient has agreed to proceed with the above procedure which will be scheduled 17 NOV 14.  Adele Barthel, MD [facility] [place] Office: [phone] Pager: [phone]  06/03/2013, 1:31 PM

## 2013-06-05 MED ORDER — DEXTROSE 5 % IV SOLN
1.5000 g | INTRAVENOUS | Status: AC
  Administered 2013-06-06: 1.5 g via INTRAVENOUS
  Filled 2013-06-05: qty 1.5

## 2013-06-06 ENCOUNTER — Encounter (HOSPITAL_COMMUNITY): Payer: Self-pay | Admitting: *Deleted

## 2013-06-06 ENCOUNTER — Ambulatory Visit (HOSPITAL_COMMUNITY)
Admission: RE | Admit: 2013-06-06 | Discharge: 2013-06-06 | Disposition: A | Discharge status: Home / Self Care | Payer: Medicare Other | Source: Ambulatory Visit | Attending: Vascular Surgery | Admitting: Vascular Surgery

## 2013-06-06 ENCOUNTER — Ambulatory Visit (HOSPITAL_COMMUNITY): Payer: Medicare Other | Admitting: Critical Care Medicine

## 2013-06-06 ENCOUNTER — Ambulatory Visit (HOSPITAL_COMMUNITY): Payer: Medicare Other

## 2013-06-06 ENCOUNTER — Encounter (HOSPITAL_COMMUNITY)
Admission: RE | Disposition: A | Discharge status: Home / Self Care | Payer: Self-pay | Source: Ambulatory Visit | Attending: Vascular Surgery

## 2013-06-06 ENCOUNTER — Telehealth: Payer: Self-pay | Admitting: Vascular Surgery

## 2013-06-06 ENCOUNTER — Encounter (HOSPITAL_COMMUNITY): Payer: Medicare Other | Admitting: Critical Care Medicine

## 2013-06-06 DIAGNOSIS — N186 End stage renal disease: Secondary | ICD-10-CM | POA: Diagnosis not present

## 2013-06-06 DIAGNOSIS — N184 Chronic kidney disease, stage 4 (severe): Secondary | ICD-10-CM | POA: Diagnosis not present

## 2013-06-06 DIAGNOSIS — D649 Anemia, unspecified: Secondary | ICD-10-CM | POA: Insufficient documentation

## 2013-06-06 DIAGNOSIS — K219 Gastro-esophageal reflux disease without esophagitis: Secondary | ICD-10-CM | POA: Diagnosis not present

## 2013-06-06 DIAGNOSIS — Z794 Long term (current) use of insulin: Secondary | ICD-10-CM | POA: Insufficient documentation

## 2013-06-06 DIAGNOSIS — I129 Hypertensive chronic kidney disease with stage 1 through stage 4 chronic kidney disease, or unspecified chronic kidney disease: Secondary | ICD-10-CM | POA: Diagnosis not present

## 2013-06-06 DIAGNOSIS — Z992 Dependence on renal dialysis: Secondary | ICD-10-CM | POA: Insufficient documentation

## 2013-06-06 DIAGNOSIS — E119 Type 2 diabetes mellitus without complications: Secondary | ICD-10-CM | POA: Insufficient documentation

## 2013-06-06 DIAGNOSIS — Z01818 Encounter for other preprocedural examination: Secondary | ICD-10-CM | POA: Diagnosis not present

## 2013-06-06 DIAGNOSIS — I10 Essential (primary) hypertension: Secondary | ICD-10-CM | POA: Diagnosis not present

## 2013-06-06 HISTORY — PX: REMOVAL OF A DIALYSIS CATHETER: SHX6053

## 2013-06-06 HISTORY — PX: AV FISTULA PLACEMENT: SHX1204

## 2013-06-06 LAB — POCT I-STAT 4, (NA,K, GLUC, HGB,HCT)
Glucose, Bld: 114 mg/dL — ABNORMAL HIGH (ref 70–99)
Hemoglobin: 7.1 g/dL — ABNORMAL LOW (ref 12.0–15.0)
Potassium: 4.2 mEq/L (ref 3.5–5.1)
Sodium: 142 mEq/L (ref 135–145)

## 2013-06-06 SURGERY — ARTERIOVENOUS (AV) FISTULA CREATION
Anesthesia: General | Site: Chest | Laterality: Right | Wound class: Clean

## 2013-06-06 MED ORDER — ONDANSETRON HCL 4 MG/2ML IJ SOLN: 4.0000 mg | Freq: Once | INTRAMUSCULAR | Status: DC | PRN

## 2013-06-06 MED ORDER — ONDANSETRON HCL 4 MG/2ML IJ SOLN
INTRAMUSCULAR | Status: DC | PRN
  Administered 2013-06-06: 4 mg via INTRAVENOUS

## 2013-06-06 MED ORDER — FENTANYL CITRATE 0.05 MG/ML IJ SOLN
INTRAMUSCULAR | Status: DC | PRN
  Administered 2013-06-06 (×2): 25 ug via INTRAVENOUS
  Administered 2013-06-06: 50 ug via INTRAVENOUS
  Administered 2013-06-06 (×2): 25 ug via INTRAVENOUS

## 2013-06-06 MED ORDER — TRAMADOL HCL 50 MG PO TABS: 50.0000 mg | ORAL_TABLET | Freq: Three times a day (TID) | ORAL | Status: DC | PRN

## 2013-06-06 MED ORDER — PROPOFOL 10 MG/ML IV BOLUS
INTRAVENOUS | Status: DC | PRN
  Administered 2013-06-06: 200 mg via INTRAVENOUS

## 2013-06-06 MED ORDER — ARTIFICIAL TEARS OP OINT
TOPICAL_OINTMENT | OPHTHALMIC | Status: DC | PRN
  Administered 2013-06-06: 1 via OPHTHALMIC

## 2013-06-06 MED ORDER — PHENYLEPHRINE HCL 10 MG/ML IJ SOLN
INTRAMUSCULAR | Status: DC | PRN
  Administered 2013-06-06: 120 ug via INTRAVENOUS
  Administered 2013-06-06 (×2): 80 ug via INTRAVENOUS

## 2013-06-06 MED ORDER — 0.9 % SODIUM CHLORIDE (POUR BTL) OPTIME
TOPICAL | Status: DC | PRN
  Administered 2013-06-06: 1000 mL

## 2013-06-06 MED ORDER — SODIUM CHLORIDE 0.9 % IR SOLN
Status: DC | PRN
  Administered 2013-06-06: 10:00:00

## 2013-06-06 MED ORDER — SODIUM CHLORIDE 0.9 % IV SOLN
INTRAVENOUS | Status: DC
  Administered 2013-06-06: 08:00:00 via INTRAVENOUS

## 2013-06-06 MED ORDER — LIDOCAINE-EPINEPHRINE (PF) 1 %-1:200000 IJ SOLN
INTRAMUSCULAR | Status: AC
  Filled 2013-06-06: qty 10

## 2013-06-06 MED ORDER — HYDROMORPHONE HCL PF 1 MG/ML IJ SOLN: 0.2500 mg | INTRAMUSCULAR | Status: DC | PRN

## 2013-06-06 MED ORDER — LIDOCAINE HCL (CARDIAC) 20 MG/ML IV SOLN
INTRAVENOUS | Status: DC | PRN
  Administered 2013-06-06: 80 mg via INTRAVENOUS

## 2013-06-06 MED ORDER — MIDAZOLAM HCL 5 MG/5ML IJ SOLN
INTRAMUSCULAR | Status: DC | PRN
  Administered 2013-06-06: 1 mg via INTRAVENOUS

## 2013-06-06 SURGICAL SUPPLY — 44 items
ADH SKN CLS APL DERMABOND .7 (GAUZE/BANDAGES/DRESSINGS) ×4
ARMBAND PINK RESTRICT EXTREMIT (MISCELLANEOUS) ×3 IMPLANT
CANISTER SUCTION 2500CC (MISCELLANEOUS) ×3 IMPLANT
CLIP TI MEDIUM 6 (CLIP) ×3 IMPLANT
CLIP TI WIDE RED SMALL 6 (CLIP) ×3 IMPLANT
COVER PROBE W GEL 5X96 (DRAPES) ×1 IMPLANT
COVER SURGICAL LIGHT HANDLE (MISCELLANEOUS) ×3 IMPLANT
DECANTER SPIKE VIAL GLASS SM (MISCELLANEOUS) ×3 IMPLANT
DERMABOND ADVANCED (GAUZE/BANDAGES/DRESSINGS) ×2
DERMABOND ADVANCED .7 DNX12 (GAUZE/BANDAGES/DRESSINGS) ×3 IMPLANT
DRAPE CHEST BREAST 15X10 FENES (DRAPES) ×1 IMPLANT
ELECT REM PT RETURN 9FT ADLT (ELECTROSURGICAL) ×3
ELECTRODE REM PT RTRN 9FT ADLT (ELECTROSURGICAL) ×2 IMPLANT
GLOVE BIO SURGEON STRL SZ7 (GLOVE) ×3 IMPLANT
GLOVE BIO SURGEON STRL SZ8 (GLOVE) ×1 IMPLANT
GLOVE BIOGEL PI IND STRL 6.5 (GLOVE) IMPLANT
GLOVE BIOGEL PI IND STRL 7.0 (GLOVE) IMPLANT
GLOVE BIOGEL PI IND STRL 7.5 (GLOVE) ×2 IMPLANT
GLOVE BIOGEL PI INDICATOR 6.5 (GLOVE) ×1
GLOVE BIOGEL PI INDICATOR 7.0 (GLOVE) ×1
GLOVE BIOGEL PI INDICATOR 7.5 (GLOVE) ×1
GLOVE SS BIOGEL STRL SZ 6.5 (GLOVE) ×2 IMPLANT
GLOVE SUPERSENSE BIOGEL SZ 6.5 (GLOVE) ×1
GLOVE SURG SS PI 7.0 STRL IVOR (GLOVE) ×1 IMPLANT
GOWN STRL NON-REIN LRG LVL3 (GOWN DISPOSABLE) ×7 IMPLANT
GOWN STRL REIN XL XLG (GOWN DISPOSABLE) ×2 IMPLANT
KIT BASIN OR (CUSTOM PROCEDURE TRAY) ×3 IMPLANT
KIT ROOM TURNOVER OR (KITS) ×3 IMPLANT
NDL HYPO 25GX1X1/2 BEV (NEEDLE) ×1 IMPLANT
NEEDLE HYPO 25GX1X1/2 BEV (NEEDLE) ×3 IMPLANT
NS IRRIG 1000ML POUR BTL (IV SOLUTION) ×3 IMPLANT
PACK CV ACCESS (CUSTOM PROCEDURE TRAY) ×3 IMPLANT
PAD ARMBOARD 7.5X6 YLW CONV (MISCELLANEOUS) ×6 IMPLANT
SPONGE SURGIFOAM ABS GEL 100 (HEMOSTASIS) IMPLANT
SUT MNCRL AB 4-0 PS2 18 (SUTURE) ×4 IMPLANT
SUT PROLENE 6 0 BV (SUTURE) ×2 IMPLANT
SUT PROLENE 7 0 BV 1 (SUTURE) ×3 IMPLANT
SUT VIC AB 3-0 SH 27 (SUTURE) ×6
SUT VIC AB 3-0 SH 27X BRD (SUTURE) ×3 IMPLANT
TOWEL NATURAL 10PK STERILE (DISPOSABLE) ×2 IMPLANT
TOWEL OR 17X24 6PK STRL BLUE (TOWEL DISPOSABLE) ×3 IMPLANT
TOWEL OR 17X26 10 PK STRL BLUE (TOWEL DISPOSABLE) ×3 IMPLANT
UNDERPAD 30X30 INCONTINENT (UNDERPADS AND DIAPERS) ×3 IMPLANT
WATER STERILE IRR 1000ML POUR (IV SOLUTION) ×3 IMPLANT

## 2013-06-06 NOTE — Telephone Encounter (Addendum)
Message copied by Gena Fray on Mon Jun 06, 2013  2:19 PM ------      Message from: Alfonso Patten      Created: Mon Jun 06, 2013 11:32 AM                   ----- Message -----         From: Conrad Herron, MD         Sent: 06/06/2013  11:26 AM           To: Patrici Ranks, Alfonso Patten, RN            [name]      [id number]      Dec 17, 1950            PROCEDURE:      1.  Right brachiocephalic arteriovenous fistula placement      2.  Removal of right internal jugular vein mediport            Asst: Wray Kearns, PAC             Follow-up: 4 weeks ------  06/06/13: lm for pt re appt info, dpm

## 2013-06-06 NOTE — Progress Notes (Signed)
06/06/13 0720  OBSTRUCTIVE SLEEP APNEA  Have you ever been diagnosed with sleep apnea through a sleep study? No  Do you snore loudly (loud enough to be heard through closed doors)?  0  Do you often feel tired, fatigued, or sleepy during the daytime? 1  Has anyone observed you stop breathing during your sleep? 0  Do you have, or are you being treated for high blood pressure? 1  BMI more than 35 kg/m2? 1  Age over 60 years old? 1  Neck circumference greater than 40 cm/18 inches? 0  Gender: 0  Obstructive Sleep Apnea Score 4  Score 4 or greater  Results sent to PCP

## 2013-06-06 NOTE — Op Note (Signed)
OPERATIVE NOTE   PROCEDURE: 1.  Right brachiocephalic arteriovenous fistula placement 2.  Removal of right internal jugular vein mediport  PRE-OPERATIVE DIAGNOSIS: chronic kidney disease stage IV   POST-OPERATIVE DIAGNOSIS: same as above   SURGEON: Adele Barthel, MD  ASSISTANT(S): Wray Kearns, PAC   ANESTHESIA: general  ESTIMATED BLOOD LOSS: 50 cc  FINDING(S): 1.  Palpable thrill with dopplerable right radial signal at end of case  SPECIMEN(S):  none  INDICATIONS:   [name] is a 62 y.o. female who presents with chronic kidney disease stage IV and prior right internal jugular vein mediport for chemotherapy for her left breast cancer.   The patient is scheduled for right brachiocephalic arteriovenous fistula placement and mediport removal.  The patient is aware the risks include but are not limited to: bleeding, infection, steal syndrome, nerve damage, ischemic monomelic neuropathy, failure to mature, and need for additional procedures.  The patient is aware of the risks of the procedure and elects to proceed forward.  DESCRIPTION: After full informed written consent was obtained from the patient, the patient was brought back to the operating room and placed supine upon the operating table.  Prior to induction, the patient received IV antibiotics.   After obtaining adequate anesthesia, the patient was then prepped and draped in the standard fashion for a right arm access procedure.  I turned my attention first to identifying the patient's cephalic vein and brachial artery.  Using SonoSite guidance, the location of these vessels were marked out on the skin.   I made a transverse incision at the level of the antecubitum and dissected through the subcutaneous tissue and fascia to gain exposure of the brachial artery.  This was noted to be 3 mm in diameter externally.  This was dissected out proximally and distally and controlled with vessel loops .  I then dissected out the cephalic  vein.  This was noted to be 4-5 mm in diameter externally.  The portion of the vein just distal to the antecubitum was in a H-configuration with another cubital vein draining into the basilic or brachial vein system.  The distal segment of the vein and the common channel was ligated with 2-0 silk, and the vein was transected obliquely to preserve drainage from the forearm into the cubital vein.  The proximal segment was iinterrogated with serial dilators.  The vein accepted up to a 4 mm dilator without any difficulty.  I then instilled the heparinized saline into the vein and clamped it.  At this point, I reset my exposure of the brachial artery and placed the artery under tension proximally and distally.  I made an arteriotomy with a #11 blade, and then I extended the arteriotomy with a Potts scissor.  I injected heparinized saline proximal and distal to this arteriotomy.  The vein was then sewn to the artery in an end-to-side configuration with a running stitch of 7-0 Prolene.  Prior to completing this anastomosis, I allowed the vein and artery to backbleed.  There was no evidence of clot from any vessels.  I completed the anastomosis in the usual fashion and then released all vessel loops and clamps.  There was a palpable  thrill in the venous outflow, and there was a dopplerable radial signal.  At this point, I irrigated out the surgical wound.  There was no further active bleeding.  The subcutaneous tissue was reapproximated with a running stitch of 3-0 Vicryl.  The skin was then reapproximated with a running subcuticular stitch of  4-0 Vicryl.  The skin was then cleaned, dried, and reinforced with Dermabond.  The patient tolerated this procedure well.   At this point, the drapes were taken down and the patient was re-prepped and re-draped for the mediport removal.  I made an incision through the previous incision and dissected out the mediport pocket with electrocautery, I identified and transected three  securing stitches.  I removed the port but in the process it disconnected.  I dissected out the catheter and clamped the catheter.  I pulled out the catheter and applied pressure to the supraclavicular tissue and also in based of the chest incision.  After 5 minutes, no further bleeding occurred.  I reapproximated the deep and superficial tissue with a running double layer of 3-0 Vicryl.  The skin was reapproximated with a running subcuticular of 4-0 Monocryl.  The skin was then cleaned, dried, and reinforced with Dermabond.  The patient tolerated this procedure well.   COMPLICATIONS: none  CONDITION: stable  Adele Barthel, MD [facility] [place] Office: [phone] Pager: [phone]  06/06/2013, 10:52 AM

## 2013-06-06 NOTE — Anesthesia Postprocedure Evaluation (Signed)
  Anesthesia Post-op Note  Patient: [name]  Procedure(s) Performed: Procedure(s): ARTERIOVENOUS (AV) FISTULA CREATION-RIGHT BRACHIAL CEPHALIC (Right) REMOVAL OF RIGHT MEDIPORT (Right)  Patient Location: PACU  Anesthesia Type:General  Level of Consciousness: awake, alert , oriented and patient cooperative  Airway and Oxygen Therapy: Patient Spontanous Breathing  Post-op Pain: mild  Post-op Assessment: Post-op Vital signs reviewed, Patient's Cardiovascular Status Stable, Respiratory Function Stable, Patent Airway, No signs of Nausea or vomiting and Pain level controlled  Post-op Vital Signs: stable  Complications: No apparent anesthesia complications

## 2013-06-06 NOTE — Interval H&P Note (Signed)
[facility]  History and Physical Update  The patient was interviewed and re-examined.  The patient's previous History and Physical has been reviewed and is unchanged.  There is no change in the plan of care: right brachiocephalic arteriovenous fistula, removal of mediport.  Adele Barthel, MD [facility] [place] Office: [phone] Pager: [phone]  06/06/2013, 7:10 AM

## 2013-06-06 NOTE — H&P (View-Only) (Signed)
[facility] [place] [facility]  Referred by:  Tivis Ringer, MD [facility] [address], [place] [address]  Reason for referral: New access  History of Present Illness  [name] is a 62 y.o. (Feb 02, 1951) female who presents for evaluation for permanent access.  The patient is right hand dominant.  The patient has had a R mediport placed previously.  The patient has never had a PPM placed but had a Left ax-dissection per pt.  Past Medical History  Diagnosis Date  . Diabetes mellitus   . Arthritis   . Hypertension   . GERD (gastroesophageal reflux disease)   . Anemia   . Myalgia 12/31/2011  . Neuropathy 12/31/2011  . Cancer   . Chronic kidney disease     Past Surgical History  Procedure Laterality Date  . Abdominal hysterectomy    . Cesarean section    . Eye surgery    . Tonsillectomy      History   Social History  . Marital Status: Single    Spouse Name: N/A    Number of Children: N/A  . Years of Education: N/A   Occupational History  . Not on file.   Social History Main Topics  . Smoking status: Never Smoker   . Smokeless tobacco: Never Used  . Alcohol Use: No  . Drug Use: No  . Sexual Activity: Not Currently   Other Topics Concern  . Not on file   Social History Narrative  . No narrative on file    Family History  Problem Relation Age of Onset  . Diabetes Mother   . Hyperlipidemia Mother   . Hypertension Mother   . Hypertension Father   . Diabetes Sister   . Diabetes Brother   . Hypertension Brother   . Heart attack Brother     Current Outpatient Prescriptions on File Prior to Visit  Medication Sig Dispense Refill  . amLODipine (NORVASC) 10 MG tablet Take 10 mg by mouth daily.      Marland Kitchen aspirin 81 MG tablet Take 81 mg by mouth daily.        . calcitRIOL (ROCALTROL) 0.25 MCG capsule Take 0.25-0.5 mcg by mouth daily. Patient takes 2 tablets on even days and one tablet on odd days.      .  furosemide (LASIX) 80 MG tablet Take 80 mg by mouth 2 (two) times daily.      . insulin glargine (LANTUS) 100 UNIT/ML injection Inject 50 Units into the skin daily.       . insulin lispro (HUMALOG KWIKPEN) 100 UNIT/ML SOPN Inject 12-16 Units into the skin 2 (two) times daily. SSI      . IRON PO Take 1 tablet by mouth daily.       Marland Kitchen lidocaine-prilocaine (EMLA) cream Apply topically as needed.  30 g  0  . omeprazole (PRILOSEC) 20 MG capsule Take 20 mg by mouth daily.      Marland Kitchen OVER THE COUNTER MEDICATION Take 2 capsules by mouth daily. Stool Softner      . traMADol (ULTRAM) 50 MG tablet Take 50 mg by mouth every 8 (eight) hours as needed for pain.       No current facility-administered medications on file prior to visit.    No Known Allergies   REVIEW OF SYSTEMS:  (Positives checked otherwise negative)  CARDIOVASCULAR:  []  chest pain, []  chest pressure, []  palpitations, [x]  shortness of breath when laying flat, []  shortness of breath with  exertion,  [x]  pain in feet when walking, [x]  pain in feet when laying flat, []  history of blood clot in veins (DVT), []  history of phlebitis, []  swelling in legs, []  varicose veins  PULMONARY:  []  productive cough, []  asthma, []  wheezing  NEUROLOGIC:  [x]  weakness in arms or legs, []  numbness in arms or legs, []  difficulty speaking or slurred speech, []  temporary loss of vision in one eye, []  dizziness  HEMATOLOGIC:  []  bleeding problems, []  problems with blood clotting too easily  MUSCULOSKEL:  []  joint pain, []  joint swelling  GASTROINTEST:  []  vomiting blood, []  blood in stool     GENITOURINARY:  [x]  burning with urination, []  blood in urine  PSYCHIATRIC:  []  history of major depression  INTEGUMENTARY:  []  rashes, []  ulcers  CONSTITUTIONAL:  []  fever, []  chills  Physical Examination  Filed Vitals:   06/03/13 1246  BP: 154/91  Pulse: 66  Height: 5\' 3"  (1.6 m)  Weight: 223 lb (101.152 kg)  SpO2: 97%   Body mass index is 39.51  kg/(m^2).  General: A&O x 3, WD, obese  Head: [place]/AT  Ear/Nose/Throat: Hearing grossly intact, nares w/o erythema or drainage, oropharynx w/o Erythema/Exudate, Mallampati score: 3  Eyes: PERRLA, EOMI  Neck: Supple, no nuchal rigidity, no palpable LAD  Pulmonary: Sym exp, good air movt, CTAB, no rales, rhonchi, & wheezing, palpable R chest mediport  Cardiac: RRR, Nl S1, S2, no Murmurs, rubs or gallops  Vascular: Vessel Right Left  Radial Palpable Palpable  Ulnar Faintly Palpable Faintly Palpable  Brachial Palpable Palpable  Carotid Palpable, without bruit Palpable, without bruit  Aorta Not palpable N/A  Femoral Palpable Palpable  Popliteal Not palpable Not palpable  PT Palpable Palpable  DP Palpable Palpable   Gastrointestinal: soft, NTND, -G/R, - HSM, - masses, - CVAT B  Musculoskeletal: M/S 5/5 throughout , Extremities without ischemic changes , visible R cephalic vein at antecubitum  Neurologic: CN 2-12 intact , Pain and light touch intact in extremities , Motor exam as listed above  Psychiatric: Judgment intact, Mood & affect appropriate for pt's clinical situation  Dermatologic: See M/S exam for extremity exam, no rashes otherwise noted  Lymph : No Cervical, Axillary, or Inguinal lymphadenopathy   Non-Invasive Vascular Imaging  Vein Mapping  (Date: 06/03/2013):   R arm: acceptable vein conduits include upper arm cephalic and basilic vein  L arm: acceptable vein conduits include upper arm basilic vein  Outside Studies/Documentation 2 outside pages of outside documents were reviewed including: outside nephrology chart.  Laboratory: CBC:    Component Value Date/Time   WBC 7.7 04/21/2013 0909   WBC 7.2 04/18/2013 1140   RBC 3.02* 04/21/2013 0909   RBC 2.93* 04/18/2013 1140   HGB 8.1* 04/21/2013 0909   HGB 8.0* 04/18/2013 1140   HCT 25.4* 04/21/2013 0909   HCT 24.6* 04/18/2013 1140   PLT 342 04/21/2013 0909   PLT 362 04/18/2013 1140   MCV 84.1 04/21/2013 0909    MCV 83.9 04/18/2013 1140   MCH 26.8 04/21/2013 0909   MCH 27.4 04/18/2013 1140   MCHC 31.9 04/21/2013 0909   MCHC 32.7 04/18/2013 1140   RDW 14.8 04/21/2013 0909   RDW 15.1* 04/18/2013 1140   LYMPHSABS 1.7 04/18/2013 1140   LYMPHSABS 2.4 10/20/2010 0400   MONOABS 0.7 04/18/2013 1140   MONOABS 1.3* 10/20/2010 0400   EOSABS 0.2 04/18/2013 1140   EOSABS 0.1 10/20/2010 0400   BASOSABS 0.0 04/18/2013 1140   BASOSABS 0.1 10/20/2010 0400  BMP:    Component Value Date/Time   NA 138 04/18/2013 1140   NA 138 03/14/2013 1314   K 4.3 04/18/2013 1140   K 4.5 03/14/2013 1314   CL 100 03/14/2013 1314   CL 110* 10/01/2012 1052   CO2 22 04/18/2013 1140   CO2 22 03/14/2013 1314   GLUCOSE 169* 04/18/2013 1140   GLUCOSE 278* 03/14/2013 1314   GLUCOSE 101* 10/01/2012 1052   BUN 60.1* 04/18/2013 1140   BUN 68* 03/14/2013 1314   CREATININE 7.4* 04/18/2013 1140   CREATININE 6.83* 03/14/2013 1314   CALCIUM 9.2 04/18/2013 1140   CALCIUM 9.0 03/14/2013 1314   GFRNONAA 6* 03/14/2013 1314   GFRAA 7* 03/14/2013 1314    Coagulation: Lab Results  Component Value Date   INR 1.02 04/21/2013   No results found for this basename: PTT   Medical Decision Making  [name] is a 62 y.o. female who presents with chronic kidney disease stage IV with imminent ESRD requiring hemodialysis.   Based on vein mapping and examination, this patient's permanent access options include: R BC AVF vs BVT.   I would avoid the L arm if it is true she has had a L ax-dissection.  The RIJ mediport also needs to be removed.  Hopefully the outflow isn't already damaged by the prior port presence.  I had an extensive discussion with this patient in regards to the nature of access surgery, including risk, benefits, and alternatives.    The patient is aware that the risks of access surgery include but are not limited to: bleeding, infection, steal syndrome, nerve damage, ischemic monomelic neuropathy, failure of access to mature, and possible need for  additional access procedures in the future.  The patient has agreed to proceed with the above procedure which will be scheduled 17 NOV 14.  Adele Barthel, MD [facility] Office: [phone] Pager: [phone]  06/03/2013, 1:31 PM

## 2013-06-06 NOTE — Anesthesia Preprocedure Evaluation (Addendum)
Anesthesia Evaluation  Patient identified by MRN, date of birth, ID band Patient awake    Reviewed: Allergy & Precautions, H&P , NPO status , Patient's Chart, lab work & pertinent test results  Airway       Dental  (+) Dental Advisory Given   Pulmonary          Cardiovascular hypertension, Pt. on medications     Neuro/Psych    GI/Hepatic GERD-  Medicated,  Endo/Other  diabetes, Type 2, Insulin Dependent  Renal/GU Dialysis and ESRFRenal disease     Musculoskeletal   Abdominal   Peds  Hematology  (+) Blood dyscrasia, anemia ,   Anesthesia Other Findings CA Breast  Reproductive/Obstetrics                          Anesthesia Physical Anesthesia Plan  ASA: III  Anesthesia Plan: General   Post-op Pain Management:    Induction: Intravenous  Airway Management Planned: LMA and Oral ETT  Additional Equipment:   Intra-op Plan:   Post-operative Plan: Extubation in OR  Informed Consent: I have reviewed the patients History and Physical, chart, labs and discussed the procedure including the risks, benefits and alternatives for the proposed anesthesia with the patient or authorized representative who has indicated his/her understanding and acceptance.     Plan Discussed with: CRNA, Anesthesiologist and Surgeon  Anesthesia Plan Comments:         Anesthesia Quick Evaluation

## 2013-06-06 NOTE — Preoperative (Signed)
Beta Blockers   Reason not to administer Beta Blockers:Not Applicable 

## 2013-06-06 NOTE — Anesthesia Procedure Notes (Signed)
Procedure Name: LMA Insertion Date/Time: 06/06/2013 9:24 AM Performed by: Carola Frost Pre-anesthesia Checklist: Timeout performed, Patient identified, Emergency Drugs available, Suction available and Patient being monitored Patient Re-evaluated:Patient Re-evaluated prior to inductionOxygen Delivery Method: Circle system utilized Preoxygenation: Pre-oxygenation with 100% oxygen Intubation Type: IV induction Ventilation: Mask ventilation without difficulty LMA: LMA inserted LMA Size: 4.0 Number of attempts: 1 Placement Confirmation: positive ETCO2 and breath sounds checked- equal and bilateral Tube secured with: Tape Dental Injury: Teeth and Oropharynx as per pre-operative assessment

## 2013-06-06 NOTE — Progress Notes (Signed)
Pt having occasional PVCS with occasional bigemeny and trigemeny. Dr. Tamala Julian notified. No new orders.

## 2013-06-06 NOTE — Transfer of Care (Signed)
Immediate Anesthesia Transfer of Care Note  Patient: [name]  Procedure(s) Performed: Procedure(s): ARTERIOVENOUS (AV) FISTULA CREATION-RIGHT BRACHIAL CEPHALIC (Right) REMOVAL OF RIGHT MEDIPORT (Right)  Patient Location: PACU  Anesthesia Type:General  Level of Consciousness: awake, alert , oriented and patient cooperative  Airway & Oxygen Therapy: [name]  Post-op Assessment: Report given to PACU RN, Post -op Vital signs reviewed and stable and Patient moving all extremities  Post vital signs: Reviewed and stable  Complications: No apparent anesthesia complications

## 2013-06-07 ENCOUNTER — Telehealth: Payer: Self-pay

## 2013-06-07 NOTE — Telephone Encounter (Signed)
Phone call from patient.  Stated that the Tramadol she was given for post-op pain, is causing her to have cramps in her legs.  Stated she feels the Tramadol is causing this because the same symptoms occurred on a previous occasion she took Tramadol.  Questioned pt. About pain level in her right arm; stated "it's just sore now, but it hurts when I first wake up in the morning."  Advised pt. to try ES Tylenol for the pain, since there is only soreness in the surgical site.  Pt. agreed she will try the Tylenol.  Advised to follow the package directions on the maximum amt. to be taken in 24 hrs.  Verb. understanding.

## 2013-06-08 ENCOUNTER — Other Ambulatory Visit (HOSPITAL_COMMUNITY): Payer: Self-pay | Admitting: *Deleted

## 2013-06-09 ENCOUNTER — Encounter (HOSPITAL_COMMUNITY): Payer: Medicare Other

## 2013-06-10 ENCOUNTER — Encounter (HOSPITAL_COMMUNITY): Payer: Self-pay | Admitting: Vascular Surgery

## 2013-06-13 ENCOUNTER — Ambulatory Visit: Payer: Medicare Other

## 2013-06-13 ENCOUNTER — Other Ambulatory Visit (HOSPITAL_BASED_OUTPATIENT_CLINIC_OR_DEPARTMENT_OTHER): Payer: Medicare Other | Admitting: Lab

## 2013-06-13 ENCOUNTER — Other Ambulatory Visit: Payer: Self-pay | Admitting: *Deleted

## 2013-06-13 ENCOUNTER — Telehealth: Payer: Self-pay | Admitting: Oncology

## 2013-06-13 ENCOUNTER — Ambulatory Visit (HOSPITAL_COMMUNITY)
Admission: RE | Admit: 2013-06-13 | Discharge: 2013-06-13 | Disposition: A | Discharge status: Home / Self Care | Payer: Medicare Other | Source: Ambulatory Visit | Attending: Oncology | Admitting: Oncology

## 2013-06-13 ENCOUNTER — Ambulatory Visit (HOSPITAL_BASED_OUTPATIENT_CLINIC_OR_DEPARTMENT_OTHER): Payer: Medicare Other | Admitting: Oncology

## 2013-06-13 ENCOUNTER — Encounter: Payer: Self-pay | Admitting: Oncology

## 2013-06-13 VITALS — HR 92 | Temp 98.4°F | Resp 20 | Wt 222.3 lb

## 2013-06-13 DIAGNOSIS — N184 Chronic kidney disease, stage 4 (severe): Secondary | ICD-10-CM | POA: Diagnosis not present

## 2013-06-13 DIAGNOSIS — C50919 Malignant neoplasm of unspecified site of unspecified female breast: Secondary | ICD-10-CM

## 2013-06-13 DIAGNOSIS — D6489 Other specified anemias: Secondary | ICD-10-CM | POA: Diagnosis not present

## 2013-06-13 DIAGNOSIS — C50219 Malignant neoplasm of upper-inner quadrant of unspecified female breast: Secondary | ICD-10-CM

## 2013-06-13 DIAGNOSIS — C50212 Malignant neoplasm of upper-inner quadrant of left female breast: Secondary | ICD-10-CM

## 2013-06-13 DIAGNOSIS — D649 Anemia, unspecified: Secondary | ICD-10-CM

## 2013-06-13 LAB — CBC WITH DIFFERENTIAL/PLATELET
Basophils Absolute: 0 10*3/uL (ref 0.0–0.1)
EOS%: 3.4 % (ref 0.0–7.0)
Eosinophils Absolute: 0.3 10*3/uL (ref 0.0–0.5)
HCT: 20.6 % — ABNORMAL LOW (ref 34.8–46.6)
HGB: 6.5 g/dL — CL (ref 11.6–15.9)
MCH: 26.3 pg (ref 25.1–34.0)
MCV: 83.4 fL (ref 79.5–101.0)
MONO#: 0.5 10*3/uL (ref 0.1–0.9)
MONO%: 6.2 % (ref 0.0–14.0)
NEUT#: 5.2 10*3/uL (ref 1.5–6.5)
NEUT%: 68.7 % (ref 38.4–76.8)
RDW: 16 % — ABNORMAL HIGH (ref 11.2–14.5)
lymph#: 1.6 10*3/uL (ref 0.9–3.3)

## 2013-06-13 LAB — COMPREHENSIVE METABOLIC PANEL (CC13)
AST: 21 U/L (ref 5–34)
Albumin: 2.8 g/dL — ABNORMAL LOW (ref 3.5–5.0)
Anion Gap: 14 mEq/L — ABNORMAL HIGH (ref 3–11)
BUN: 93.4 mg/dL — ABNORMAL HIGH (ref 7.0–26.0)
Calcium: 8.8 mg/dL (ref 8.4–10.4)
Chloride: 107 mEq/L (ref 98–109)
Creatinine: 9.3 mg/dL (ref 0.6–1.1)
Glucose: 124 mg/dl (ref 70–140)
Potassium: 4.4 mEq/L (ref 3.5–5.1)
Sodium: 141 mEq/L (ref 136–145)

## 2013-06-13 NOTE — Progress Notes (Signed)
Per patient, graft put in on Monday Dec 17 and port removed.  Right arm has fistula, left arm restricted d/t lymph node removal.   Pt sent to lab for draw.

## 2013-06-13 NOTE — Progress Notes (Signed)
OFFICE PROGRESS NOTE  CC Dr. Fanny Skates Dr. Evette Cristal Dr. Berneta Sages  DIAGNOSIS: 62 year-old female with stage I breast cancer.  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 She underwent a lumpectomy with sentinel node dissection.  #2 she then received 4 cycles of dose dense FEC 100 and 12 weeks of single agent Taxol given adjuvantly.  #3 she then went on to receive radiation therapy to the left breast. All of her therapy was completed in October 2012  #4 bone scan performed on 12/22/2011 no evidence of metastatic disease a significant amount of arthritis.  #5 renal insufficiency: Patient is being seen by nephrology.  #6 anemia: Due to chronic disease and renal insufficiency  CURRENT THERAPY:Observation  INTERVAL HISTORY: TEJAH FORTINI 62 y.o. female returns for Followup visit. Patient had a fistula performed in her right arm. She has gone to be seeing her nephrologist sometime in December to begin hemodialysis. Patient continues to be anemic her hemoglobin has gone down further but she seems to be asymptomatic. I am however concerned. We discussed doing blood transfusions but I am concerned about putting her into heart failure. She otherwise feels well she has no clinical evidence of recurrent breast cancer. She has no bleeding problems.  MEDICAL HISTORY: Past Medical History  Diagnosis Date  . Diabetes mellitus   . Arthritis   . Hypertension   . GERD (gastroesophageal reflux disease)   . Anemia   . Myalgia 12/31/2011  . Neuropathy 12/31/2011  . Chronic kidney disease   . Cancer     left breast cancer    ALLERGIES:  has No Known Allergies.  MEDICATIONS:  Current Outpatient Prescriptions  Medication Sig Dispense Refill  . amLODipine (NORVASC) 10 MG tablet Take 10 mg by mouth daily.      Marland Kitchen aspirin 81 MG tablet Take 81 mg by mouth daily.        . calcitRIOL (ROCALTROL) 0.25 MCG capsule  Take 0.25-0.5 mcg by mouth daily. Patient takes 0.5 mcg on even days and 0.25 mcg on odd days.      . furosemide (LASIX) 80 MG tablet Take 80 mg by mouth 2 (two) times daily.      . insulin glargine (LANTUS) 100 UNIT/ML injection Inject 50 Units into the skin daily.       . insulin lispro (HUMALOG KWIKPEN) 100 UNIT/ML SOPN Inject 12-16 Units into the skin 2 (two) times daily. SSI      . lidocaine-prilocaine (EMLA) cream Apply topically as needed.  30 g  0  . omeprazole (PRILOSEC) 20 MG capsule Take 20 mg by mouth daily.      Marland Kitchen OVER THE COUNTER MEDICATION Take 2 capsules by mouth daily. Stool Softner      . terconazole (TERAZOL 7) 0.4 % vaginal cream Place 1 applicator vaginally at bedtime.      . traMADol (ULTRAM) 50 MG tablet Take 1 tablet (50 mg total) by mouth every 8 (eight) hours as needed.  20 tablet  0   No current facility-administered medications for this visit.    SURGICAL HISTORY:  Past Surgical History  Procedure Laterality Date  . Cesarean section    . Tonsillectomy    . Eye surgery Bilateral     cataracts removal  . Eye surgery Bilateral     laser surgery  . Abdominal hysterectomy      partial  . Breast surgery    .  Av fistula placement Right 06/06/2013    Procedure: ARTERIOVENOUS (AV) FISTULA CREATION-RIGHT BRACHIAL CEPHALIC;  Surgeon: Conrad Coushatta, MD;  Location: [facility];  Service: Vascular;  Laterality: Right;  . Removal of a dialysis catheter Right 06/06/2013    Procedure: REMOVAL OF RIGHT MEDIPORT;  Surgeon: Conrad [place], MD;  Location: [facility];  Service: Vascular;  Laterality: Right;    REVIEW OF SYSTEMS:  Pertinent items are noted in HPI.   PHYSICAL EXAMINATION:  Well-developed well-nourished female in no acute distress. HEENT exam EOMI PERRLA sclerae anicteric no conjunctival pallor oral mucosa is moist neck is supple lungs are clear bilaterally to auscultation and percussion cardiovascular is regular rate rhythm no murmurs gallops or rubs abdomen is soft nontender  nondistended bowel sounds are present no HSM extremities no clubbing edema or cyanosis neuro patient's alert oriented otherwise nonfocal. Bilateral breast examination right breast without any masses nipple discharge or skin changes. Left breast reveals a well-healed surgical scar, it is barely visible. There are no dominant masses no nipple discharge.  ECOG PERFORMANCE STATUS: 1 - Symptomatic but completely ambulatory  There were no vitals taken for this visit.  LABORATORY DATA: Lab Results  Component Value Date   WBC 7.6 06/13/2013   HGB 6.5* 06/13/2013   HCT 20.6* 06/13/2013   MCV 83.4 06/13/2013   PLT 367 06/13/2013      Chemistry      Component Value Date/Time   NA 142 06/06/2013 0734   NA 138 04/18/2013 1140   K 4.2 06/06/2013 0734   K 4.3 04/18/2013 1140   CL 100 03/14/2013 1314   CL 110* 10/01/2012 1052   CO2 22 04/18/2013 1140   CO2 22 03/14/2013 1314   BUN 60.1* 04/18/2013 1140   BUN 68* 03/14/2013 1314   CREATININE 7.4* 04/18/2013 1140   CREATININE 6.83* 03/14/2013 1314   GLU 98 12/13/2010 1108      Component Value Date/Time   CALCIUM 9.2 04/18/2013 1140   CALCIUM 9.0 03/14/2013 1314   ALKPHOS 101 04/18/2013 1140   ALKPHOS 77 11/24/2011 0940   AST 24 04/18/2013 1140   AST 19 11/24/2011 0940   ALT 34 04/18/2013 1140   ALT 28 11/24/2011 0940   BILITOT 0.30 04/18/2013 1140   BILITOT 0.3 11/24/2011 0940       RADIOGRAPHIC STUDIES: NUCLEAR MEDICINE WHOLE BODY BONE SCINTIGRAPHY  Technique: Whole body anterior and posterior images were obtained  approximately 3 hours after intravenous injection of  radiopharmaceutical.  Radiopharmaceutical: 25MILLI CURIE TC-MDP TECHNETIUM TC 54M  MEDRONATE IV KIT  Comparison: None  Radiographic correlation: Pelvic radiograph 04/28/2011, chest CT  10/19/2010  Findings:  Uptake at the shoulders, wrists, knees, and feet, typically  degenerative or arthritic. Uptake at greater trochanteric region  right femur, uncertain etiology.  No definite  radiographic abnormalities are seen at this site to  account for the abnormal bone scan.  Subtle uptake at the left lateral aspect of mid to lower thoracic  spine, corresponding to the endplate spur formation on chest CT.  Uptake identified in the lower cervical spine near the  cervicothoracic junction at midline to left, nonspecific but  potentially degenerative in origin.  No other sites of abnormal osseous tracer localization identified.  Expected urinary tract and soft tissue distribution of tracer.  IMPRESSION:  Abnormal uptake right femur at the greater trochanteric region  laterally, of uncertain etiology.  Abnormal uptake in lower cervical spine near the cervicothoracic  junction.  Radiographic correlation of these sites recommended.  No other abnormal sites of osseous tracer accumulation identified.  Original Report Authenticated By: Burnetta Sabin, M.D.   ASSESSMENT: 62 year old female with:  1.  triple negative invasive ductal carcinoma of the left breast diagnosed in 2012. She is status post lumpectomy with sentinel node biopsy for a stage I disease. She then went on to receive adjuvant chemotherapy consisting of dose dense FEC 100 for 4 cycles followed by 12 weeks of single agent Taxol. Overall she tolerated the chemotherapy relatively well. She also completed postlumpectomy radiation therapy in October 2012. Since then she has been on observation only.  #2 patient had a bone scan performed that did not reveal any evidence of metastatic disease but she is noted to have significant amount of arthritis.    #3 musculoskeletal pain chronic do to her arthritis and possibly peripheral neuropathies.  #4 chronic anemia with  renal insufficiency .  PLAN:  #1 continue to monitor patient clinically.  #2 patient will be soon starting dialysis. I do think she will require Procrit at some point. Her hemoglobin is quite low she may require blood transfusion however my concern is fluid  overload if we gave her a transfusion currently. Patient seems to be asymptomatic. We certainly could wait. She does have up appointment coming up with her nephrologist.  #3 I will see the patient back in 6 months time or sooner if need arises.   All questions were answered. The patient knows to call the clinic with any problems, questions or concerns. We can certainly see the patient much sooner if necessary.  I spent 15 minutes counseling the patient face to face. The total time spent in the appointment was 30 minutes.    Marcy Panning, MD Medical/Oncology [facility] [phone] (beeper) [phone] (Office)  06/13/2013, 11:35 AM

## 2013-06-15 ENCOUNTER — Other Ambulatory Visit: Payer: Self-pay

## 2013-06-15 ENCOUNTER — Emergency Department (HOSPITAL_COMMUNITY): Payer: Medicare Other

## 2013-06-15 ENCOUNTER — Encounter (HOSPITAL_COMMUNITY): Payer: Self-pay | Admitting: Emergency Medicine

## 2013-06-15 ENCOUNTER — Inpatient Hospital Stay (HOSPITAL_COMMUNITY): Payer: Medicare Other

## 2013-06-15 ENCOUNTER — Inpatient Hospital Stay (HOSPITAL_COMMUNITY)
Admission: EM | Admit: 2013-06-15 | Discharge: 2013-06-18 | DRG: 682 | Disposition: A | Discharge status: Home / Self Care | Payer: Medicare Other | Attending: Internal Medicine | Admitting: Internal Medicine

## 2013-06-15 DIAGNOSIS — D649 Anemia, unspecified: Secondary | ICD-10-CM

## 2013-06-15 DIAGNOSIS — D631 Anemia in chronic kidney disease: Secondary | ICD-10-CM | POA: Diagnosis not present

## 2013-06-15 DIAGNOSIS — I120 Hypertensive chronic kidney disease with stage 5 chronic kidney disease or end stage renal disease: Secondary | ICD-10-CM | POA: Diagnosis not present

## 2013-06-15 DIAGNOSIS — Z992 Dependence on renal dialysis: Secondary | ICD-10-CM

## 2013-06-15 DIAGNOSIS — I509 Heart failure, unspecified: Secondary | ICD-10-CM

## 2013-06-15 DIAGNOSIS — C50219 Malignant neoplasm of upper-inner quadrant of unspecified female breast: Secondary | ICD-10-CM | POA: Diagnosis not present

## 2013-06-15 DIAGNOSIS — N184 Chronic kidney disease, stage 4 (severe): Secondary | ICD-10-CM

## 2013-06-15 DIAGNOSIS — N186 End stage renal disease: Secondary | ICD-10-CM | POA: Diagnosis not present

## 2013-06-15 DIAGNOSIS — E8779 Other fluid overload: Secondary | ICD-10-CM

## 2013-06-15 DIAGNOSIS — Z853 Personal history of malignant neoplasm of breast: Secondary | ICD-10-CM

## 2013-06-15 DIAGNOSIS — R0600 Dyspnea, unspecified: Secondary | ICD-10-CM

## 2013-06-15 DIAGNOSIS — E119 Type 2 diabetes mellitus without complications: Secondary | ICD-10-CM

## 2013-06-15 DIAGNOSIS — E1129 Type 2 diabetes mellitus with other diabetic kidney complication: Secondary | ICD-10-CM | POA: Diagnosis present

## 2013-06-15 DIAGNOSIS — N185 Chronic kidney disease, stage 5: Secondary | ICD-10-CM | POA: Diagnosis not present

## 2013-06-15 DIAGNOSIS — G629 Polyneuropathy, unspecified: Secondary | ICD-10-CM

## 2013-06-15 DIAGNOSIS — G589 Mononeuropathy, unspecified: Secondary | ICD-10-CM | POA: Diagnosis present

## 2013-06-15 DIAGNOSIS — R0609 Other forms of dyspnea: Secondary | ICD-10-CM

## 2013-06-15 DIAGNOSIS — E8770 Fluid overload, unspecified: Secondary | ICD-10-CM

## 2013-06-15 DIAGNOSIS — C50212 Malignant neoplasm of upper-inner quadrant of left female breast: Secondary | ICD-10-CM | POA: Diagnosis present

## 2013-06-15 DIAGNOSIS — K219 Gastro-esophageal reflux disease without esophagitis: Secondary | ICD-10-CM | POA: Diagnosis present

## 2013-06-15 DIAGNOSIS — M791 Myalgia, unspecified site: Secondary | ICD-10-CM

## 2013-06-15 DIAGNOSIS — I5043 Acute on chronic combined systolic (congestive) and diastolic (congestive) heart failure: Secondary | ICD-10-CM | POA: Diagnosis present

## 2013-06-15 DIAGNOSIS — R0602 Shortness of breath: Secondary | ICD-10-CM | POA: Diagnosis not present

## 2013-06-15 DIAGNOSIS — R0989 Other specified symptoms and signs involving the circulatory and respiratory systems: Secondary | ICD-10-CM | POA: Diagnosis not present

## 2013-06-15 DIAGNOSIS — N058 Unspecified nephritic syndrome with other morphologic changes: Secondary | ICD-10-CM | POA: Diagnosis present

## 2013-06-15 HISTORY — DX: End stage renal disease: N18.6

## 2013-06-15 HISTORY — DX: Type 2 diabetes mellitus without complications: E11.9

## 2013-06-15 HISTORY — DX: Anxiety disorder, unspecified: F41.9

## 2013-06-15 HISTORY — DX: Malignant neoplasm of unspecified site of unspecified female breast: C50.919

## 2013-06-15 HISTORY — DX: Shortness of breath: R06.02

## 2013-06-15 LAB — CBC WITH DIFFERENTIAL/PLATELET
Eosinophils Absolute: 0.3 10*3/uL (ref 0.0–0.7)
HCT: 21.3 % — ABNORMAL LOW (ref 36.0–46.0)
Hemoglobin: 6.7 g/dL — CL (ref 12.0–15.0)
Lymphocytes Relative: 31 % (ref 12–46)
Lymphs Abs: 2.4 10*3/uL (ref 0.7–4.0)
MCH: 26.7 pg (ref 26.0–34.0)
Monocytes Relative: 8 % (ref 3–12)
Neutro Abs: 4.5 10*3/uL (ref 1.7–7.7)
Neutrophils Relative %: 57 % (ref 43–77)
Platelets: 421 10*3/uL — ABNORMAL HIGH (ref 150–400)
RBC: 2.51 MIL/uL — ABNORMAL LOW (ref 3.87–5.11)
WBC: 7.9 10*3/uL (ref 4.0–10.5)

## 2013-06-15 LAB — CREATININE, SERUM
Creatinine, Ser: 8.79 mg/dL — ABNORMAL HIGH (ref 0.50–1.10)
GFR calc non Af Amer: 4 mL/min — ABNORMAL LOW (ref >90)

## 2013-06-15 LAB — CBC
MCH: 26.9 pg (ref 26.0–34.0)
MCHC: 32.5 g/dL (ref 30.0–36.0)
Platelets: 404 10*3/uL — ABNORMAL HIGH (ref 150–400)
RBC: 2.34 MIL/uL — ABNORMAL LOW (ref 3.87–5.11)
RDW: 16.4 % — ABNORMAL HIGH (ref 11.5–15.5)

## 2013-06-15 LAB — BASIC METABOLIC PANEL
BUN: 77 mg/dL — ABNORMAL HIGH (ref 6–23)
CO2: 21 mEq/L (ref 19–32)
Chloride: 107 mEq/L (ref 96–112)
GFR calc non Af Amer: 4 mL/min — ABNORMAL LOW (ref >90)
Glucose, Bld: 65 mg/dL — ABNORMAL LOW (ref 70–99)
Potassium: 4 mEq/L (ref 3.5–5.1)
Sodium: 142 mEq/L (ref 135–145)

## 2013-06-15 LAB — TROPONIN I: Troponin I: <0.3 ng/mL (ref <0.30)

## 2013-06-15 MED ORDER — LORAZEPAM 2 MG/ML IJ SOLN
INTRAMUSCULAR | Status: AC
  Filled 2013-06-15: qty 1

## 2013-06-15 MED ORDER — PENTAFLUOROPROP-TETRAFLUOROETH EX AERO: 1.0000 "application " | INHALATION_SPRAY | CUTANEOUS | Status: DC | PRN

## 2013-06-15 MED ORDER — HEPARIN SODIUM (PORCINE) 1000 UNIT/ML IJ SOLN
INTRAMUSCULAR | Status: AC
  Filled 2013-06-15: qty 1

## 2013-06-15 MED ORDER — RENA-VITE PO TABS
1.0000 | ORAL_TABLET | Freq: Every day | ORAL | Status: DC
  Administered 2013-06-16 – 2013-06-18 (×3): 1 via ORAL
  Filled 2013-06-15 (×4): qty 1

## 2013-06-15 MED ORDER — ONDANSETRON HCL 4 MG PO TABS: 4.0000 mg | ORAL_TABLET | Freq: Four times a day (QID) | ORAL | Status: DC | PRN

## 2013-06-15 MED ORDER — ASPIRIN 81 MG PO TABS: 81.0000 mg | ORAL_TABLET | Freq: Every day | ORAL | Status: DC

## 2013-06-15 MED ORDER — ALTEPLASE 2 MG IJ SOLR
2.0000 mg | Freq: Once | INTRAMUSCULAR | Status: AC | PRN
  Filled 2013-06-15: qty 2

## 2013-06-15 MED ORDER — AMLODIPINE BESYLATE 10 MG PO TABS
10.0000 mg | ORAL_TABLET | Freq: Every day | ORAL | Status: DC
  Administered 2013-06-16 – 2013-06-18 (×3): 10 mg via ORAL
  Filled 2013-06-15 (×3): qty 1

## 2013-06-15 MED ORDER — HEPARIN SODIUM (PORCINE) 1000 UNIT/ML DIALYSIS: 5000.0000 [IU] | INTRAMUSCULAR | Status: DC | PRN

## 2013-06-15 MED ORDER — INSULIN ASPART 100 UNIT/ML ~~LOC~~ SOLN
0.0000 [IU] | Freq: Three times a day (TID) | SUBCUTANEOUS | Status: DC
  Administered 2013-06-16: 1 [IU] via SUBCUTANEOUS
  Administered 2013-06-16: 2 [IU] via SUBCUTANEOUS
  Administered 2013-06-18: 1 [IU] via SUBCUTANEOUS
  Administered 2013-06-18: 2 [IU] via SUBCUTANEOUS

## 2013-06-15 MED ORDER — SODIUM CHLORIDE 0.9 % IV SOLN: 100.0000 mL | INTRAVENOUS | Status: DC | PRN

## 2013-06-15 MED ORDER — FUROSEMIDE 80 MG PO TABS
80.0000 mg | ORAL_TABLET | Freq: Two times a day (BID) | ORAL | Status: DC
  Filled 2013-06-15 (×2): qty 1

## 2013-06-15 MED ORDER — PANTOPRAZOLE SODIUM 40 MG PO TBEC
40.0000 mg | DELAYED_RELEASE_TABLET | Freq: Every day | ORAL | Status: DC
  Administered 2013-06-16 – 2013-06-18 (×3): 40 mg via ORAL
  Filled 2013-06-15 (×3): qty 1

## 2013-06-15 MED ORDER — CALCITRIOL 0.25 MCG PO CAPS: 0.2500 ug | ORAL_CAPSULE | Freq: Every day | ORAL | Status: DC

## 2013-06-15 MED ORDER — DOXERCALCIFEROL 4 MCG/2ML IV SOLN
1.0000 ug | INTRAVENOUS | Status: DC
  Administered 2013-06-15: 23:00:00 via INTRAVENOUS
  Administered 2013-06-17: 1 ug via INTRAVENOUS
  Filled 2013-06-15: qty 2

## 2013-06-15 MED ORDER — AMLODIPINE BESYLATE 10 MG PO TABS
10.0000 mg | ORAL_TABLET | Freq: Every day | ORAL | Status: DC
  Filled 2013-06-15: qty 1

## 2013-06-15 MED ORDER — LIDOCAINE HCL (PF) 1 % IJ SOLN: 5.0000 mL | INTRAMUSCULAR | Status: DC | PRN

## 2013-06-15 MED ORDER — NEPRO/CARBSTEADY PO LIQD: 237.0000 mL | ORAL | Status: DC | PRN

## 2013-06-15 MED ORDER — ASPIRIN EC 81 MG PO TBEC
81.0000 mg | DELAYED_RELEASE_TABLET | Freq: Every day | ORAL | Status: DC
  Administered 2013-06-16 – 2013-06-18 (×3): 81 mg via ORAL
  Filled 2013-06-15 (×3): qty 1

## 2013-06-15 MED ORDER — LIDOCAINE-PRILOCAINE 2.5-2.5 % EX CREA
1.0000 | TOPICAL_CREAM | CUTANEOUS | Status: DC | PRN
Start: 2013-06-15 — End: 2013-06-17

## 2013-06-15 MED ORDER — DOXERCALCIFEROL 4 MCG/2ML IV SOLN
INTRAVENOUS | Status: AC
  Filled 2013-06-15: qty 2

## 2013-06-15 MED ORDER — HEPARIN SODIUM (PORCINE) 1000 UNIT/ML DIALYSIS
1000.0000 [IU] | INTRAMUSCULAR | Status: DC | PRN
  Filled 2013-06-15: qty 1

## 2013-06-15 MED ORDER — DARBEPOETIN ALFA-POLYSORBATE 200 MCG/0.4ML IJ SOLN: 200.0000 ug | INTRAMUSCULAR | Status: DC

## 2013-06-15 MED ORDER — ACETAMINOPHEN 325 MG PO TABS
650.0000 mg | ORAL_TABLET | Freq: Four times a day (QID) | ORAL | Status: DC | PRN
  Administered 2013-06-17 – 2013-06-18 (×2): 650 mg via ORAL
  Filled 2013-06-15 (×2): qty 2

## 2013-06-15 MED ORDER — LORAZEPAM 2 MG/ML IJ SOLN
2.0000 mg | Freq: Once | INTRAMUSCULAR | Status: AC
  Administered 2013-06-15: 1 mg via INTRAMUSCULAR
  Filled 2013-06-15: qty 1

## 2013-06-15 MED ORDER — DARBEPOETIN ALFA-POLYSORBATE 100 MCG/0.5ML IJ SOLN
100.0000 ug | INTRAMUSCULAR | Status: DC
  Filled 2013-06-15: qty 0.5

## 2013-06-15 MED ORDER — ONDANSETRON HCL 4 MG/2ML IJ SOLN: 4.0000 mg | Freq: Four times a day (QID) | INTRAMUSCULAR | Status: DC | PRN

## 2013-06-15 NOTE — Progress Notes (Signed)
Pt received two units of PRBCs in hemodialysis; tolerated transfusion well; no s/s of a reaction noted. Pt taken out of UF for approximately 20 minutes due to cramping in both hands and  Right leg.

## 2013-06-15 NOTE — H&P (Signed)
Chief Complaint: "Shortness of breath." Referring Physician: Dr. Posey Pronto HPI: [name] is an 62 y.o. female with PMHx of CKD stage V recently placed right forearm AVG 06/06/13 and right IJ mediport removed 06/06/13 after she has finished her treatments for her left breast cancer. She has had a left axillary node dissection. She presents to ED today c/o shortness of breath and fatigue with Cr 8.71 BUN 77 today. hgb 6.7 HCT 21.3 the patient will need a blood transfusion as well as dialysis. Request for image guided temporary HD catheter with conversion to tunneled catheter at a later date when H/H status is stable. The patient denies any chest pain, she denies any bleeding. She has been NPO today.   Past Medical History:  Past Medical History  Diagnosis Date  . Diabetes mellitus   . Arthritis   . Hypertension   . GERD (gastroesophageal reflux disease)   . Anemia   . Myalgia 12/31/2011  . Neuropathy 12/31/2011  . Chronic kidney disease   . Cancer     left breast cancer    Past Surgical History:  Past Surgical History  Procedure Laterality Date  . Cesarean section    . Tonsillectomy    . Eye surgery Bilateral     cataracts removal  . Eye surgery Bilateral     laser surgery  . Abdominal hysterectomy      partial  . Breast surgery    . Av fistula placement Right 06/06/2013    Procedure: ARTERIOVENOUS (AV) FISTULA CREATION-RIGHT BRACHIAL CEPHALIC;  Surgeon: Conrad [place], MD;  Location: [facility];  Service: Vascular;  Laterality: Right;  . Removal of a dialysis catheter Right 06/06/2013    Procedure: REMOVAL OF RIGHT MEDIPORT;  Surgeon: Conrad [place], MD;  Location: [facility];  Service: Vascular;  Laterality: Right;    Family History:  Family History  Problem Relation Age of Onset  . Diabetes Mother   . Hyperlipidemia Mother   . Hypertension Mother   . Hypertension Father   . Diabetes Sister   . Diabetes Brother   . Hypertension Brother   . Heart attack Brother     Social  History:  reports that she has never smoked. She has never used smokeless tobacco. She reports that she does not drink alcohol or use illicit drugs.  Allergies: No Known Allergies  Medications:   Medication List    ASK your doctor about these medications       acetaminophen 325 MG tablet  Commonly known as:  TYLENOL  Take 650 mg by mouth every 6 (six) hours as needed.     amLODipine 10 MG tablet  Commonly known as:  NORVASC  Take 10 mg by mouth daily.     aspirin 81 MG tablet  Take 81 mg by mouth daily.     calcitRIOL 0.25 MCG capsule  Commonly known as:  ROCALTROL  Take 0.25-0.5 mcg by mouth daily. Patient takes 0.5 mcg on even days and 0.25 mcg on odd days.     furosemide 80 MG tablet  Commonly known as:  LASIX  Take 80 mg by mouth 2 (two) times daily.     HUMALOG KWIKPEN 100 UNIT/ML Sopn  Generic drug:  insulin lispro  Inject 12-16 Units into the skin 2 (two) times daily. SSI     insulin glargine 100 UNIT/ML injection  Commonly known as:  LANTUS  Inject 50 Units into the skin daily.     omeprazole 20 MG capsule  Commonly  known as:  PRILOSEC  Take 20 mg by mouth daily.     OVER THE COUNTER MEDICATION  Take 2 capsules by mouth daily. Stool Softner       Please HPI for pertinent positives, otherwise complete 10 system ROS negative.  Physical Exam: BP 190/84  Pulse 94  Temp(Src) 98.7 F (37.1 C) (Oral)  Resp 16  Ht 5\' 3"  (1.6 m)  Wt 223 lb 9.6 oz (101.424 kg)  BMI 39.62 kg/m2  SpO2 97% Body mass index is 39.62 kg/(m^2).  General Appearance:  Alert, cooperative, no distress  Head:  Normocephalic, without obvious abnormality, atraumatic  Neck: JVP present, symmetrical, trachea midline.  Lungs:   Clear to auscultation bilaterally, no w/r/r, respirations unlabored without use of accessory muscles.  Chest Wall:  No tenderness or deformity  Heart:  Regular rate and rhythm, S1, S2 normal, systolic murmur, rub or gallop.  Abdomen:   Soft, non-tender, non  distended, (+) BS  Extremities: Extremities right brachiocephalic AVG, no cyanosis or edema  Pulses: 2+ and symmetric  Neurologic: Normal affect, no gross deficits.   Results for orders placed during the hospital encounter of 06/15/13 (from the past 48 hour(s))  CBC WITH DIFFERENTIAL     Status: Abnormal   Collection Time    06/15/13 12:20 PM      Result Value Range   WBC 7.9  4.0 - 10.5 K/uL   RBC 2.51 (*) 3.87 - 5.11 MIL/uL   Hemoglobin 6.7 (*) 12.0 - 15.0 g/dL   Comment: CRITICAL RESULT CALLED TO, READ BACK BY AND VERIFIED WITH:     C ERICSON,RN AT 1317 06/15/13 BY K BARR   HCT 21.3 (*) 36.0 - 46.0 %   MCV 84.9  78.0 - 100.0 fL   MCH 26.7  26.0 - 34.0 pg   MCHC 31.5  30.0 - 36.0 g/dL   RDW 16.1 (*) 11.5 - 15.5 %   Platelets 421 (*) 150 - 400 K/uL   Neutrophils Relative % 57  43 - 77 %   Neutro Abs 4.5  1.7 - 7.7 K/uL   Lymphocytes Relative 31  12 - 46 %   Lymphs Abs 2.4  0.7 - 4.0 K/uL   Monocytes Relative 8  3 - 12 %   Monocytes Absolute 0.7  0.1 - 1.0 K/uL   Eosinophils Relative 3  0 - 5 %   Eosinophils Absolute 0.3  0.0 - 0.7 K/uL   Basophils Relative 1  0 - 1 %   Basophils Absolute 0.1  0.0 - 0.1 K/uL  BASIC METABOLIC PANEL     Status: Abnormal   Collection Time    06/15/13 12:20 PM      Result Value Range   Sodium 142  135 - 145 mEq/L   Potassium 4.0  3.5 - 5.1 mEq/L   Chloride 107  96 - 112 mEq/L   CO2 21  19 - 32 mEq/L   Glucose, Bld 65 (*) 70 - 99 mg/dL   BUN 77 (*) 6 - 23 mg/dL   Creatinine, Ser 8.71 (*) 0.50 - 1.10 mg/dL   Calcium 8.6  8.4 - 10.5 mg/dL   GFR calc non Af Amer 4 (*) >90 mL/min   GFR calc Af Amer 5 (*) >90 mL/min   Comment: (NOTE)     The eGFR has been calculated using the CKD EPI equation.     This calculation has not been validated in all clinical situations.     eGFR's persistently <90  mL/min signify possible Chronic Kidney     Disease.  TROPONIN I     Status: None   Collection Time    06/15/13 12:20 PM      Result Value Range    Troponin I <0.30  <0.30 ng/mL   Comment:            Due to the release kinetics of cTnI,     a negative result within the first hours     of the onset of symptoms does not rule out     myocardial infarction with certainty.     If myocardial infarction is still suspected,     repeat the test at appropriate intervals.   Dg Chest 2 View  06/15/2013   CLINICAL DATA:  Shortness of breath, weakness, history hypertension, diabetes, renal failure, breast cancer  EXAM: CHEST  2 VIEW  COMPARISON:  06/06/2013  FINDINGS: Enlargement of cardiac silhouette with pulmonary vascular congestion.  Mild perihilar interstitial infiltrates question pulmonary edema.  No segmental consolidation, pleural effusion or pneumothorax.  Bones demineralized with endplate spur formation thoracic spine.  IMPRESSION: Probable CHF.   Electronically Signed   By: Lavonia Dana M.D.   On: 06/15/2013 12:31    Assessment/Plan CKD stage V progressed to ESRD plan for HD today. Right forearm AVG placed 06/06/13 Request for temporary HD catheter placement today. Anemia hgb 6.7 patient to be transfused today with dialysis. Shortness of breath. Labs reviewed, patient has been NPO Risks and Benefits discussed with the patient and her family. All of the patient's questions were answered, patient is agreeable to proceed. Consent signed and in chart.   Tsosie Billing D PA-C 06/15/2013, 2:38 PM

## 2013-06-15 NOTE — ED Notes (Signed)
Pt reports recently being told her hgb 6, having sob last night with productive cough and nausea. ekg being done at triage, airway intact. Recently had graft placed right arm, has not started dialysis.

## 2013-06-15 NOTE — ED Notes (Signed)
Dr. Campos at bedside   

## 2013-06-15 NOTE — ED Notes (Signed)
Dr. Patel at bedside 

## 2013-06-15 NOTE — Consult Note (Signed)
Reason for Consult: Shortness of breath with anemia in a patient with chronic kidney disease stage V Referring Physician: Dr. Jola Schmidt (Emergency room physician)  HPI:  [name] past medical history significant for rather rapidly progressive chronic kidney disease now to stage V over the past one year. Recently had a renal biopsy done that showed features consistent with diabetic kidney disease. Last week (06/06/2013) she underwent right brachiocephalic fistula creation (Dr. Bridgett Larsson) that was uncomplicated. On Monday, she apparently went for her Procrit injection and was told that she had anemia significant enough to require blood transfusion however this could not be done. Since then, she has had progressively worsening shortness of breath with some vague epigastric pain and occasional nausea. She denies any fevers or chills and has been having a slight decrease in her appetite (primarily due to her decreased selection of meals). She presented to the emergency room today as she could not tolerate her shortness of breath anymore and was having some worsening pedal edema.   Past Medical History  Diagnosis Date  . Diabetes mellitus   . Arthritis   . Hypertension   . GERD (gastroesophageal reflux disease)   . Anemia   . Myalgia 12/31/2011  . Neuropathy 12/31/2011  . Chronic kidney disease   . Cancer     left breast cancer    Past Surgical History  Procedure Laterality Date  . Cesarean section    . Tonsillectomy    . Eye surgery Bilateral     cataracts removal  . Eye surgery Bilateral     laser surgery  . Abdominal hysterectomy      partial  . Breast surgery    . Av fistula placement Right 06/06/2013    Procedure: ARTERIOVENOUS (AV) FISTULA CREATION-RIGHT BRACHIAL CEPHALIC;  Surgeon: Conrad [place], MD;  Location: [facility];  Service: Vascular;  Laterality: Right;  . Removal of a dialysis catheter Right 06/06/2013    Procedure: REMOVAL OF RIGHT MEDIPORT;  Surgeon:  Conrad Delway, MD;  Location: [facility];  Service: Vascular;  Laterality: Right;    Family History  Problem Relation Age of Onset  . Diabetes Mother   . Hyperlipidemia Mother   . Hypertension Mother   . Hypertension Father   . Diabetes Sister   . Diabetes Brother   . Hypertension Brother   . Heart attack Brother     Social History:  reports that she has never smoked. She has never used smokeless tobacco. She reports that she does not drink alcohol or use illicit drugs.  Allergies: No Known Allergies  Medications:  Scheduled: . darbepoetin (ARANESP) injection - DIALYSIS  100 mcg Intravenous Q Wed-HD    Results for orders placed during the hospital encounter of 06/15/13 (from the past 48 hour(s))  CBC WITH DIFFERENTIAL     Status: Abnormal   Collection Time    06/15/13 12:20 PM      Result Value Range   WBC 7.9  4.0 - 10.5 K/uL   RBC 2.51 (*) 3.87 - 5.11 MIL/uL   Hemoglobin 6.7 (*) 12.0 - 15.0 g/dL   Comment: CRITICAL RESULT CALLED TO, READ BACK BY AND VERIFIED WITH:     C ERICSON,RN AT 1317 06/15/13 BY K BARR   HCT 21.3 (*) 36.0 - 46.0 %   MCV 84.9  78.0 - 100.0 fL   MCH 26.7  26.0 - 34.0 pg   MCHC 31.5  30.0 - 36.0 g/dL   RDW 16.1 (*) 11.5 -  15.5 %   Platelets 421 (*) 150 - 400 K/uL   Neutrophils Relative % 57  43 - 77 %   Neutro Abs 4.5  1.7 - 7.7 K/uL   Lymphocytes Relative 31  12 - 46 %   Lymphs Abs 2.4  0.7 - 4.0 K/uL   Monocytes Relative 8  3 - 12 %   Monocytes Absolute 0.7  0.1 - 1.0 K/uL   Eosinophils Relative 3  0 - 5 %   Eosinophils Absolute 0.3  0.0 - 0.7 K/uL   Basophils Relative 1  0 - 1 %   Basophils Absolute 0.1  0.0 - 0.1 K/uL  BASIC METABOLIC PANEL     Status: Abnormal   Collection Time    06/15/13 12:20 PM      Result Value Range   Sodium 142  135 - 145 mEq/L   Potassium 4.0  3.5 - 5.1 mEq/L   Chloride 107  96 - 112 mEq/L   CO2 21  19 - 32 mEq/L   Glucose, Bld 65 (*) 70 - 99 mg/dL   BUN 77 (*) 6 - 23 mg/dL   Creatinine, Ser 8.71 (*) 0.50 - 1.10  mg/dL   Calcium 8.6  8.4 - 10.5 mg/dL   GFR calc non Af Amer 4 (*) >90 mL/min   GFR calc Af Amer 5 (*) >90 mL/min   Comment: (NOTE)     The eGFR has been calculated using the CKD EPI equation.     This calculation has not been validated in all clinical situations.     eGFR's persistently <90 mL/min signify possible Chronic Kidney     Disease.  TROPONIN I     Status: None   Collection Time    06/15/13 12:20 PM      Result Value Range   Troponin I <0.30  <0.30 ng/mL   Comment:            Due to the release kinetics of cTnI,     a negative result within the first hours     of the onset of symptoms does not rule out     myocardial infarction with certainty.     If myocardial infarction is still suspected,     repeat the test at appropriate intervals.    Dg Chest 2 View  06/15/2013   CLINICAL DATA:  Shortness of breath, weakness, history hypertension, diabetes, renal failure, breast cancer  EXAM: CHEST  2 VIEW  COMPARISON:  06/06/2013  FINDINGS: Enlargement of cardiac silhouette with pulmonary vascular congestion.  Mild perihilar interstitial infiltrates question pulmonary edema.  No segmental consolidation, pleural effusion or pneumothorax.  Bones demineralized with endplate spur formation thoracic spine.  IMPRESSION: Probable CHF.   Electronically Signed   By: Lavonia Dana M.D.   On: 06/15/2013 12:31    Review of Systems  Constitutional: Positive for malaise/fatigue. Negative for fever, chills, weight loss and diaphoresis.  HENT: Negative.   Eyes: Negative.   Respiratory: Positive for shortness of breath. Negative for cough, hemoptysis, sputum production and wheezing.   Cardiovascular: Positive for palpitations, orthopnea and leg swelling. Negative for chest pain, claudication and PND.  Gastrointestinal: Positive for nausea and abdominal pain. Negative for heartburn, vomiting, diarrhea, constipation and blood in stool.       Circumferential epigastric pain that seems to radiate around  to both flanks  Genitourinary: Negative.   Musculoskeletal: Negative.   Skin: Negative.   Neurological: Positive for weakness. Negative for dizziness, tingling, tremors,  sensory change and focal weakness.  Endo/Heme/Allergies: Negative.   Psychiatric/Behavioral: Negative.    Blood pressure 183/90, pulse 89, temperature 98.1 F (36.7 C), temperature source Oral, resp. rate 18, height 5\' 3"  (1.6 m), weight 101.424 kg (223 lb 9.6 oz), SpO2 96.00%. Physical Exam  Nursing note and vitals reviewed. Constitutional: She is oriented to person, place, and time. She appears well-developed and well-nourished. No distress.  HENT:  Head: Normocephalic and atraumatic.  Nose: Nose normal.  Mouth/Throat: No oropharyngeal exudate.  Eyes: Conjunctivae are normal. Pupils are equal, round, and reactive to light. No scleral icterus.  Neck: Normal range of motion. Neck supple. JVD present. No tracheal deviation present. No thyromegaly present.  JVP 7-8 cm  Cardiovascular: Normal rate and regular rhythm.  Exam reveals no gallop and no friction rub.   Murmur heard. Grade 3/6 ejection systolic murmur over apex  Respiratory: Effort normal. No respiratory distress. She has no wheezes. She has rales. She exhibits no tenderness.  Fine rales left base  GI: Soft. Bowel sounds are normal. She exhibits no distension. There is no tenderness. There is no rebound and no guarding.  Musculoskeletal: She exhibits edema. She exhibits no tenderness.  1-2+ edema bilateral lower extremities Palpable thrill over right upper arm brachiocephalic fistula  Lymphadenopathy:    She has no cervical adenopathy.  Neurological: She is alert and oriented to person, place, and time.  Skin: Skin is warm and dry. No rash noted. No erythema.  Psychiatric: She has a normal mood and affect. Thought content normal.    Assessment/Plan: 1. Anemia with shortness of breath: Suspect that indeed her shortness of breath may be a symptom of profound  anemia and will be responsive to packed red cell transfusion-however, the safety of a blood transfusion is in question with her advanced chronic kidney disease.  After discussion with Dr. Mercy Moore, we'll start her on hemodialysis and to a blood transfusion during dialysis to avoid further compromise of her respiratory status. ESA will also be restarted. I will recheck her iron stores today. 2. Chronic kidney disease stage V: Appears to have progressed over to end-stage renal disease-hemodialysis to be started today. I have requested for interventional radiology to place a dialysis catheter (likely temporary with conversion to tunneled after improvement of her hemoglobin/symptoms as she may not be able to lay flat for long period of time due to orthopnea). Dr. Mercy Moore has updated me that he has initiated the process of having her placed to [facility]. We'll await on decision regarding her placement. She has a right brachiocephalic fistula that is one week old but appears to be maturing at a satisfactory rate (should be ready to use in 9-12 weeks per vascular surgery). 3. Metabolic bone disease: We'll check calcium, phosphorus and magnesium level. We'll switch her from oral calcitriol to intravenous Hectorol. 4. Hypertension: Resume antihypertensive therapy and anticipate dialysis will help further with ultrafiltration.  [name] K. 06/15/2013, 2:13 PM

## 2013-06-15 NOTE — ED Notes (Signed)
IR Pa at bedside, Per Dr Posey Pronto, Transfuse at Dialysis.

## 2013-06-15 NOTE — Procedures (Signed)
I was present at this session.  I have reviewed the session itself and made appropriate changes.  Hd via L IJ cath.  bp ^ to start.  [name] L 11/26/20148:40 PM

## 2013-06-15 NOTE — H&P (Signed)
[facility] History and Physical  IVELIS MIMBS [id number] DOB: Jan 08, 1951 DOA: 06/15/2013  Referring physician: EDP PCP: Tivis Ringer, MD  Specialists: Dr Mercy Moore Chief Complaint: sob since last night.   HPI: [name] is a 62 y.o. female with prior h/o hypertension, CHF, DM, stage 4 CKD, not on HD yet, came in for sob since last night. On arrival to ED, she was found to be tachypnic, her CXR showed mild perihilar interstitial infiltrates consistent with pulm edema.  She was also found to be anemic with hemoglobin of 6.7. She was referred to medical service for admission. She denies any chest pain or orthopnea or PND. Mild pedal edema. She recently had a right brachiocephalic fistula placement.   Review of Systems: The patient denies anorexia, fever, weight loss,, vision loss, decreased hearing, hoarseness, chest pain, syncope,  peripheral edema, balance deficits, hemoptysis, abdominal pain, melena, hematochezia,  hematuria, incontinence, genital sores, muscle weakness, suspicious skin lesions, transient blindness, difficulty walking, depression, unusual weight change, abnormal bleeding, enlarged lymph nodes, angioedema, and breast masses.   Past Medical History  Diagnosis Date  . Diabetes mellitus   . Arthritis   . Hypertension   . GERD (gastroesophageal reflux disease)   . Anemia   . Myalgia 12/31/2011  . Neuropathy 12/31/2011  . Chronic kidney disease   . Cancer     left breast cancer   Past Surgical History  Procedure Laterality Date  . Cesarean section    . Tonsillectomy    . Eye surgery Bilateral     cataracts removal  . Eye surgery Bilateral     laser surgery  . Abdominal hysterectomy      partial  . Breast surgery    . Av fistula placement Right 06/06/2013    Procedure: ARTERIOVENOUS (AV) FISTULA CREATION-RIGHT BRACHIAL CEPHALIC;  Surgeon: Conrad [place], MD;  Location: [place];  Service: Vascular;  Laterality: Right;  . Removal of a dialysis  catheter Right 06/06/2013    Procedure: REMOVAL OF RIGHT MEDIPORT;  Surgeon: Conrad Calera, MD;  Location: [place];  Service: Vascular;  Laterality: Right;   Social History:  reports that she has never smoked. She has never used smokeless tobacco. She reports that she does not drink alcohol or use illicit drugs.  where does patient live--home,   Can patient participate in ADLs?  No Known Allergies  Family History  Problem Relation Age of Onset  . Diabetes Mother   . Hyperlipidemia Mother   . Hypertension Mother   . Hypertension Father   . Diabetes Sister   . Diabetes Brother   . Hypertension Brother   . Heart attack Brother     Prior to Admission medications   Medication Sig Start Date End Date Taking? Authorizing Provider  acetaminophen (TYLENOL) 325 MG tablet Take 650 mg by mouth every 6 (six) hours as needed.   Yes Historical Provider, MD  amLODipine (NORVASC) 10 MG tablet Take 10 mg by mouth daily.   Yes Historical Provider, MD  aspirin 81 MG tablet Take 81 mg by mouth daily.     Yes Historical Provider, MD  calcitRIOL (ROCALTROL) 0.25 MCG capsule Take 0.25-0.5 mcg by mouth daily. Patient takes 0.5 mcg on even days and 0.25 mcg on odd days.   Yes Historical Provider, MD  furosemide (LASIX) 80 MG tablet Take 80 mg by mouth 2 (two) times daily.   Yes Historical Provider, MD  insulin glargine (LANTUS) 100 UNIT/ML injection Inject 50 Units into the skin  daily.    Yes Historical Provider, MD  insulin lispro (HUMALOG KWIKPEN) 100 UNIT/ML SOPN Inject 12-16 Units into the skin 2 (two) times daily. SSI   Yes Historical Provider, MD  omeprazole (PRILOSEC) 20 MG capsule Take 20 mg by mouth daily.   Yes Historical Provider, MD  OVER THE COUNTER MEDICATION Take 2 capsules by mouth daily. Stool Softner   Yes Historical Provider, MD   Physical Exam: Filed Vitals:   06/15/13 1430  BP: 191/66  Pulse: 91  Temp:   Resp:     Constitutional: Vital signs reviewed.  Patient is a well-developed  and well-nourished  in no acute distress and cooperative with exam. Alert and oriented x3.  Head: Normocephalic and atraumatic Mouth: no erythema or exudates, MMM Eyes: PERRL, EOMI, conjunctivae normal, No scleral icterus.  Neck: Supple, Trachea midline normal ROM, no mass, thyromegaly, or carotid bruit present.  Cardiovascular: RRR, S1 normal, S2 normal, murmer present.  Pulmonary/Chest: bibasilar rales. No wheezing heard.  Abdominal: Soft. Non-tender, non-distended, bowel sounds are normal, no masses, organomegaly, or guarding present.  Musculoskeletal: No joint deformities, erythema, or stiffness, ROM full and no non tender, mild pedal edema.  Neurological: A&O x3, Strength is normal and symmetric bilaterally,  Speech normal.  Skin: Warm, dry and intact. No rash, cyanosis, or clubbing.  Psychiatric: Normal mood and affect. speech and behavior is normal.    Labs on Admission:  Basic Metabolic Panel:  Recent Labs Lab 06/13/13 1037 06/15/13 1220  NA 141 142  K 4.4 4.0  CL  --  107  CO2 20* 21  GLUCOSE 124 65*  BUN 93.4* 77*  CREATININE 9.3* 8.71*  CALCIUM 8.8 8.6   Liver Function Tests:  Recent Labs Lab 06/13/13 1037  AST 21  ALT 28  ALKPHOS 74  BILITOT 0.22  PROT 7.0  ALBUMIN 2.8*   No results found for this basename: LIPASE, AMYLASE,  in the last 168 hours No results found for this basename: AMMONIA,  in the last 168 hours CBC:  Recent Labs Lab 06/13/13 1037 06/15/13 1220  WBC 7.6 7.9  NEUTROABS 5.2 4.5  HGB 6.5* 6.7*  HCT 20.6* 21.3*  MCV 83.4 84.9  PLT 367 421*   Cardiac Enzymes:  Recent Labs Lab 06/15/13 1220  TROPONINI <0.30    BNP (last 3 results) No results found for this basename: PROBNP,  in the last 8760 hours CBG: No results found for this basename: GLUCAP,  in the last 168 hours  Radiological Exams on Admission: Dg Chest 2 View  06/15/2013   CLINICAL DATA:  Shortness of breath, weakness, history hypertension, diabetes, renal  failure, breast cancer  EXAM: CHEST  2 VIEW  COMPARISON:  06/06/2013  FINDINGS: Enlargement of cardiac silhouette with pulmonary vascular congestion.  Mild perihilar interstitial infiltrates question pulmonary edema.  No segmental consolidation, pleural effusion or pneumothorax.  Bones demineralized with endplate spur formation thoracic spine.  IMPRESSION: Probable CHF.   Electronically Signed   By: Lavonia Dana M.D.   On: 06/15/2013 12:31    EKG: sinus tachycardia at 109/min  Assessment/Plan Active Problems: 1. Anemia: probably from the CKD stage 4. 2 units of PRBC transfusion ordered by EDP.  Will get the blood transfusion during HD.  2. CKD stage 4: HD today , IR put in temporary HD catheter for HD today. Dr Posey Pronto consulted by EDP.   3. Stage 1 breast cancer: s/p lumpectomy , 4 cycles of chemotherapy and radiation therpy. Further management as per oncology.  4. Sob: probably fluid overload from CKD . HD later today should improve her symptoms.   5. Hypertension: not well controlled. Resume home medications. And HD tonight.   6. DVT prophylaxis.   7. Diabetes Mellitus: hgba1c, SSI   Code Status: full code Family Communication: none at bedside. Discussed the plan of care with the patient.  Disposition Plan: admit to inpatient.   Time spent: 68 min  [facility] Pager [phone]  If 7PM-7AM, please contact night-coverage [email or web address] Password [facility] 06/15/2013, 3:49 PM

## 2013-06-15 NOTE — ED Provider Notes (Signed)
CSN: [id number]     Arrival date & time 06/15/13  1047 History   First MD Initiated Contact with Patient 06/15/13 1101     Chief Complaint  Patient presents with  . Shortness of Breath     HPI Patient reports worsening shortness of breath over the past 48 hours.  She's had cough and reports new orthopnea as well as dyspnea on exertion.  No fevers or chills.  She has had nausea without vomiting.  She has a history of stage IV kidney disease and has a maturing fistula in her right upper extremity.  She has a history of breast cancer status post chemoradiation and lymph removal on her left side.  She states from a cancer standpoint she is in remission.  Past Medical History  Diagnosis Date  . Diabetes mellitus   . Arthritis   . Hypertension   . GERD (gastroesophageal reflux disease)   . Anemia   . Myalgia 12/31/2011  . Neuropathy 12/31/2011  . Chronic kidney disease   . Cancer     left breast cancer   Past Surgical History  Procedure Laterality Date  . Cesarean section    . Tonsillectomy    . Eye surgery Bilateral     cataracts removal  . Eye surgery Bilateral     laser surgery  . Abdominal hysterectomy      partial  . Breast surgery    . Av fistula placement Right 06/06/2013    Procedure: ARTERIOVENOUS (AV) FISTULA CREATION-RIGHT BRACHIAL CEPHALIC;  Surgeon: Conrad [place], MD;  Location: [place];  Service: Vascular;  Laterality: Right;  . Removal of a dialysis catheter Right 06/06/2013    Procedure: REMOVAL OF RIGHT MEDIPORT;  Surgeon: Conrad Palermo, MD;  Location: [place];  Service: Vascular;  Laterality: Right;   Family History  Problem Relation Age of Onset  . Diabetes Mother   . Hyperlipidemia Mother   . Hypertension Mother   . Hypertension Father   . Diabetes Sister   . Diabetes Brother   . Hypertension Brother   . Heart attack Brother    History  Substance Use Topics  . Smoking status: Never Smoker   . Smokeless tobacco: Never Used  . Alcohol Use: No   OB History    Grav Para Term Preterm Abortions TAB SAB Ect Mult Living                 Review of Systems  All other systems reviewed and are negative.    Allergies  Review of patient's allergies indicates no known allergies.  Home Medications   Current Outpatient Rx  Name  Route  Sig  Dispense  Refill  . acetaminophen (TYLENOL) 325 MG tablet   Oral   Take 650 mg by mouth every 6 (six) hours as needed.         Marland Kitchen amLODipine (NORVASC) 10 MG tablet   Oral   Take 10 mg by mouth daily.         Marland Kitchen aspirin 81 MG tablet   Oral   Take 81 mg by mouth daily.           . calcitRIOL (ROCALTROL) 0.25 MCG capsule   Oral   Take 0.25-0.5 mcg by mouth daily. Patient takes 0.5 mcg on even days and 0.25 mcg on odd days.         . furosemide (LASIX) 80 MG tablet   Oral   Take 80 mg by mouth 2 (two) times daily.         Marland Kitchen  insulin glargine (LANTUS) 100 UNIT/ML injection   Subcutaneous   Inject 50 Units into the skin daily.          . insulin lispro (HUMALOG KWIKPEN) 100 UNIT/ML SOPN   Subcutaneous   Inject 12-16 Units into the skin 2 (two) times daily. SSI         . omeprazole (PRILOSEC) 20 MG capsule   Oral   Take 20 mg by mouth daily.         Marland Kitchen OVER THE COUNTER MEDICATION   Oral   Take 2 capsules by mouth daily. Stool Softner          BP 183/90  Pulse 89  Temp(Src) 98.1 F (36.7 C) (Oral)  Resp 18  Ht 5\' 3"  (1.6 m)  Wt 223 lb 9.6 oz (101.424 kg)  BMI 39.62 kg/m2  SpO2 96% Physical Exam  Nursing note and vitals reviewed. Constitutional: She is oriented to person, place, and time. She appears well-developed and well-nourished. No distress.  HENT:  Head: Normocephalic and atraumatic.  Eyes: EOM are normal.  Neck: Normal range of motion.  Cardiovascular: Normal rate, regular rhythm and normal heart sounds.   Pulmonary/Chest: Effort normal. She has rales.  Abdominal: Soft. She exhibits no distension. There is no tenderness.  Musculoskeletal: Normal range of motion.   Neurological: She is alert and oriented to person, place, and time.  Skin: Skin is warm and dry.  Psychiatric: She has a normal mood and affect. Judgment normal.    ED Course  Procedures (including critical care time) Labs Review Labs Reviewed  CBC WITH DIFFERENTIAL - Abnormal; Notable for the following:    RBC 2.51 (*)    Hemoglobin 6.7 (*)    HCT 21.3 (*)    RDW 16.1 (*)    Platelets 421 (*)    All other components within normal limits  BASIC METABOLIC PANEL - Abnormal; Notable for the following:    Glucose, Bld 65 (*)    BUN 77 (*)    Creatinine, Ser 8.71 (*)    GFR calc non Af Amer 4 (*)    GFR calc Af Amer 5 (*)    All other components within normal limits  TROPONIN I  TYPE AND SCREEN  PREPARE RBC (CROSSMATCH)   Imaging Review Dg Chest 2 View  06/15/2013   CLINICAL DATA:  Shortness of breath, weakness, history hypertension, diabetes, renal failure, breast cancer  EXAM: CHEST  2 VIEW  COMPARISON:  06/06/2013  FINDINGS: Enlargement of cardiac silhouette with pulmonary vascular congestion.  Mild perihilar interstitial infiltrates question pulmonary edema.  No segmental consolidation, pleural effusion or pneumothorax.  Bones demineralized with endplate spur formation thoracic spine.  IMPRESSION: Probable CHF.   Electronically Signed   By: Lavonia Dana M.D.   On: 06/15/2013 12:31  I personally reviewed the imaging tests through PACS system I reviewed available ER/hospitalization records through the EMR   EKG Interpretation   None       MDM   1. Dyspnea   2. Volume overload   3. Anemia    2:02 PM Patient will likely benefit from blood transfusion which will likely make her shortness of breath feel better.  Given her volume overload and if she receives blood transfusion she'll likely benefit from initiation of dialysis today.  I discussed the case with nephrology, Dr. Posey Pronto, who will see the patient in the emergency department.  The patient be admitted to the hospital  floor at this time by the hospitalist service.  A long discussion was had with the family and the patient and the benefits and risks of blood transfusion and the likely need for dialysis.    Hoy Morn, MD 06/15/13 [phone]

## 2013-06-15 NOTE — ED Notes (Signed)
Both of patients arms are restricted.  Pink bands put on both arms.  Must use leg for blood pressure cuff.  Nurse Darci Current aware.

## 2013-06-15 NOTE — ED Notes (Signed)
Phlebotomy at bedside.

## 2013-06-16 DIAGNOSIS — D649 Anemia, unspecified: Secondary | ICD-10-CM

## 2013-06-16 LAB — CBC
Hemoglobin: 9.1 g/dL — ABNORMAL LOW (ref 12.0–15.0)
MCH: 28.4 pg (ref 26.0–34.0)
MCHC: 33.7 g/dL (ref 30.0–36.0)
MCV: 84.4 fL (ref 78.0–100.0)
RBC: 3.2 MIL/uL — ABNORMAL LOW (ref 3.87–5.11)
RDW: 15.5 % (ref 11.5–15.5)

## 2013-06-16 LAB — BASIC METABOLIC PANEL
BUN: 54 mg/dL — ABNORMAL HIGH (ref 6–23)
Chloride: 103 mEq/L (ref 96–112)
Creatinine, Ser: 6.72 mg/dL — ABNORMAL HIGH (ref 0.50–1.10)
GFR calc non Af Amer: 6 mL/min — ABNORMAL LOW (ref >90)
Glucose, Bld: 112 mg/dL — ABNORMAL HIGH (ref 70–99)
Potassium: 4 mEq/L (ref 3.5–5.1)
Sodium: 138 mEq/L (ref 135–145)

## 2013-06-16 LAB — GLUCOSE, CAPILLARY
Glucose-Capillary: 130 mg/dL — ABNORMAL HIGH (ref 70–99)
Glucose-Capillary: 114 mg/dL — ABNORMAL HIGH (ref 70–99)
Glucose-Capillary: 176 mg/dL — ABNORMAL HIGH (ref 70–99)
Glucose-Capillary: 141 mg/dL — ABNORMAL HIGH (ref 70–99)

## 2013-06-16 LAB — TYPE AND SCREEN
ABO/RH(D): A POS
Antibody Screen: NEGATIVE
Unit division: 0
Unit division: 0
Unit division: 0

## 2013-06-16 LAB — IRON AND TIBC
Iron: 34 ug/dL — ABNORMAL LOW (ref 42–135)
Saturation Ratios: 14 % — ABNORMAL LOW (ref 20–55)
TIBC: 238 ug/dL — ABNORMAL LOW (ref 250–470)
UIBC: 204 ug/dL (ref 125–400)

## 2013-06-16 LAB — HEPATITIS B SURFACE ANTIGEN: Hepatitis B Surface Ag: NEGATIVE

## 2013-06-16 LAB — FERRITIN: Ferritin: 333 ng/mL — ABNORMAL HIGH (ref 10–291)

## 2013-06-16 MED ORDER — SODIUM CHLORIDE 0.9 % IV SOLN
1020.0000 mg | Freq: Once | INTRAVENOUS | Status: AC
  Administered 2013-06-16: 1020 mg via INTRAVENOUS
  Filled 2013-06-16: qty 34

## 2013-06-16 MED ORDER — KIDNEY FAILURE BOOK
Freq: Once | Status: DC
  Filled 2013-06-16: qty 1

## 2013-06-16 MED ORDER — DOCUSATE SODIUM 100 MG PO CAPS
100.0000 mg | ORAL_CAPSULE | Freq: Two times a day (BID) | ORAL | Status: DC | PRN
  Administered 2013-06-16: 100 mg via ORAL
  Filled 2013-06-16: qty 1

## 2013-06-16 MED ORDER — HYDRALAZINE HCL 20 MG/ML IJ SOLN: 10.0000 mg | INTRAMUSCULAR | Status: DC | PRN

## 2013-06-16 MED ORDER — CYCLOBENZAPRINE HCL 5 MG PO TABS
5.0000 mg | ORAL_TABLET | Freq: Once | ORAL | Status: AC
  Administered 2013-06-16: 5 mg via ORAL
  Filled 2013-06-16: qty 1

## 2013-06-16 NOTE — Progress Notes (Signed)
Patient ID: [name], female   DOB: 07-02-1951, 62 y.o.   MRN: [id number]  [facility] Progress Note   Assessment/ Plan:   1. Anemia with shortness of breath: Shortness of breath significantly better following blood transfusion and ultrafiltration at hemodialysis. ESA restarted, iron saturation 14% and ferritin 333-we'll give intravenous iron. 2. Chronic kidney disease stage V with progression to end-stage renal disease-now on maintenance hemodialysis. Next hemodialysis to be done tomorrow. Likely to be converted to a tunneled dialysis catheter is well tomorrow. Awaiting placement at outpatient dialysis unit ([facility]). 3. Metabolic bone disease: Await phosphorus levels to decide on binders-restarted on vitamin D receptor analogs 4. Hypertension: Improve with ultrafiltration and resumption of antihypertensive therapy-continue to monitor closely for further adjustments   Subjective:   Reports to be feeling better-had some cramping in dialysis yesterday. Slept well and shortness of breath notably better    Objective:   BP 170/80  Pulse 80  Temp(Src) 98.3 F (36.8 C) (Oral)  Resp 18  Ht 5\' 3"  (1.6 m)  Wt 98.7 kg (217 lb 9.5 oz)  BMI 38.55 kg/m2  SpO2 96%  Physical Exam: Gen: Comfortably resting in bed CVS: Pulse regular in rate and rhythm, heart sounds S1 and S2 normal Resp: Decreased breath sounds over bases-poor inspiratory effort. No distinct rales Abd: Soft, obese, nontender and bowel sounds are normal Ext: Trace to 1+ lower extremity edema bilateral  Labs: BMET  Recent Labs Lab 06/13/13 1037 06/15/13 1220 06/15/13 1730 06/16/13 0520  NA 141 142  --  138  K 4.4 4.0  --  4.0  CL  --  107  --  103  CO2 20* 21  --  23  GLUCOSE 124 65*  --  112*  BUN 93.4* 77*  --  54*  CREATININE 9.3* 8.71* 8.79* 6.72*  CALCIUM 8.8 8.6  --  8.5   CBC  Recent Labs Lab 06/13/13 1037 06/15/13 1220 06/15/13 1730 06/16/13 0520  WBC 7.6 7.9  6.3 8.3  NEUTROABS 5.2 4.5  --   --   HGB 6.5* 6.7* 6.3* 9.1*  HCT 20.6* 21.3* 19.4* 27.0*  MCV 83.4 84.9 82.9 84.4  PLT 367 421* 404* 366    Medications:    . amLODipine  10 mg Oral QHS  . aspirin EC  81 mg Oral Daily  . [START ON 06/22/2013] darbepoetin (ARANESP) injection - DIALYSIS  200 mcg Intravenous Q Wed-HD  . doxercalciferol  1 mcg Intravenous Q M,W,F-HD  . insulin aspart  0-9 Units Subcutaneous TID WC  . multivitamin  1 tablet Oral QHS  . pantoprazole  40 mg Oral Daily   [name], MD 06/16/2013, 9:57 AM

## 2013-06-16 NOTE — Progress Notes (Signed)
[facility] PROGRESS NOTE  [name] [id number] DOB: 02/05/1951 DOA: 06/15/2013 PCP: Tivis Ringer, MD  Assessment/Plan: Anemia: probably from the CKD stage 4. S/p 2 units during HD  CKD stage 4: HD started 11/17 after catheter placed by IR , Nephro following  Stage 1 breast cancer: s/p lumpectomy , 4 cycles of chemotherapy and radiation therpy. Further management as per oncology.   Sob: probably fluid overload from CKD , improved after HD   Hypertension: not well controlled. Hydralazine PRN   Diabetes Mellitus: SSI   Code Status: full Family Communication: patient Disposition Plan:    Consultants:  renal  Procedures:  HD started 11/26  Antibiotics:    HPI/Subjective: Feeling better Sore in her RUQ  Objective: Filed Vitals:   06/16/13 0424  BP: 170/80  Pulse: 80  Temp: 98.3 F (36.8 C)  Resp: 18    Intake/Output Summary (Last 24 hours) at 06/16/13 0924 Last data filed at 06/16/13 0014  Gross per 24 hour  Intake    890 ml  Output   2290 ml  Net  -1400 ml   Filed Weights   06/15/13 1054 06/15/13 2027 06/15/13 2258  Weight: 101.424 kg (223 lb 9.6 oz) 100.2 kg (220 lb 14.4 oz) 98.7 kg (217 lb 9.5 oz)    Exam:   General:  Pleasant, NAD  Cardiovascular: rrr  Respiratory: clear anterior  Abdomen: +BS, soft  Musculoskeletal: moves all 4 ext  Data Reviewed: Basic Metabolic Panel:  Recent Labs Lab 06/13/13 1037 06/15/13 1220 06/15/13 1730 06/16/13 0520  NA 141 142  --  138  K 4.4 4.0  --  4.0  CL  --  107  --  103  CO2 20* 21  --  23  GLUCOSE 124 65*  --  112*  BUN 93.4* 77*  --  54*  CREATININE 9.3* 8.71* 8.79* 6.72*  CALCIUM 8.8 8.6  --  8.5   Liver Function Tests:  Recent Labs Lab 06/13/13 1037  AST 21  ALT 28  ALKPHOS 74  BILITOT 0.22  PROT 7.0  ALBUMIN 2.8*   No results found for this basename: LIPASE, AMYLASE,  in the last 168 hours No results found for this basename: AMMONIA,  in the last 168  hours CBC:  Recent Labs Lab 06/13/13 1037 06/15/13 1220 06/15/13 1730 06/16/13 0520  WBC 7.6 7.9 6.3 8.3  NEUTROABS 5.2 4.5  --   --   HGB 6.5* 6.7* 6.3* 9.1*  HCT 20.6* 21.3* 19.4* 27.0*  MCV 83.4 84.9 82.9 84.4  PLT 367 421* 404* 366   Cardiac Enzymes:  Recent Labs Lab 06/15/13 1220  TROPONINI <0.30   BNP (last 3 results) No results found for this basename: PROBNP,  in the last 8760 hours CBG:  Recent Labs Lab 06/16/13 0007 06/16/13 0100 06/16/13 0735  GLUCAP 64* 130* 114*    No results found for this or any previous visit (from the past 240 hour(s)).   Studies: Dg Chest 2 View  06/15/2013   CLINICAL DATA:  Shortness of breath, weakness, history hypertension, diabetes, renal failure, breast cancer  EXAM: CHEST  2 VIEW  COMPARISON:  06/06/2013  FINDINGS: Enlargement of cardiac silhouette with pulmonary vascular congestion.  Mild perihilar interstitial infiltrates question pulmonary edema.  No segmental consolidation, pleural effusion or pneumothorax.  Bones demineralized with endplate spur formation thoracic spine.  IMPRESSION: Probable CHF.   Electronically Signed   By: Lavonia Dana M.D.   On: 06/15/2013 12:31    Scheduled  Meds: . amLODipine  10 mg Oral QHS  . aspirin EC  81 mg Oral Daily  . [START ON 06/22/2013] darbepoetin (ARANESP) injection - DIALYSIS  200 mcg Intravenous Q Wed-HD  . doxercalciferol  1 mcg Intravenous Q M,W,F-HD  . insulin aspart  0-9 Units Subcutaneous TID WC  . multivitamin  1 tablet Oral QHS  . pantoprazole  40 mg Oral Daily   Continuous Infusions:   Active Problems:   Malignant neoplasm of upper-inner quadrant of female breast   Chronic kidney disease (CKD), stage IV (severe)   Anemia   CHF (congestive heart failure)   Diabetes    Time spent: 35 min    Mikell Camp  [facility] Pager [phone]. If 7PM-7AM, please contact night-coverage at [email or web address], password [facility] 06/16/2013, 9:24 AM  LOS: 1 day

## 2013-06-17 ENCOUNTER — Inpatient Hospital Stay (HOSPITAL_COMMUNITY): Payer: Medicare Other

## 2013-06-17 LAB — RENAL FUNCTION PANEL
Albumin: 2.9 g/dL — ABNORMAL LOW (ref 3.5–5.2)
BUN: 61 mg/dL — ABNORMAL HIGH (ref 6–23)
Creatinine, Ser: 8.15 mg/dL — ABNORMAL HIGH (ref 0.50–1.10)
Glucose, Bld: 133 mg/dL — ABNORMAL HIGH (ref 70–99)
Phosphorus: 4.8 mg/dL — ABNORMAL HIGH (ref 2.3–4.6)
Potassium: 4.2 mEq/L (ref 3.5–5.1)

## 2013-06-17 LAB — PROTIME-INR
INR: 1.03 (ref 0.00–1.49)
Prothrombin Time: 13.3 seconds (ref 11.6–15.2)

## 2013-06-17 LAB — CBC
HCT: 26 % — ABNORMAL LOW (ref 36.0–46.0)
MCH: 28.5 pg (ref 26.0–34.0)
MCHC: 34.2 g/dL (ref 30.0–36.0)
MCV: 83.3 fL (ref 78.0–100.0)
Platelets: 340 10*3/uL (ref 150–400)
RBC: 3.12 MIL/uL — ABNORMAL LOW (ref 3.87–5.11)
RDW: 15.7 % — ABNORMAL HIGH (ref 11.5–15.5)
WBC: 9.3 10*3/uL (ref 4.0–10.5)

## 2013-06-17 LAB — GLUCOSE, CAPILLARY
Glucose-Capillary: 117 mg/dL — ABNORMAL HIGH (ref 70–99)
Glucose-Capillary: 97 mg/dL (ref 70–99)
Glucose-Capillary: 344 mg/dL — ABNORMAL HIGH (ref 70–99)

## 2013-06-17 LAB — LIPASE, BLOOD: Lipase: 49 U/L (ref 11–59)

## 2013-06-17 MED ORDER — CEFAZOLIN SODIUM-DEXTROSE 2-3 GM-% IV SOLR
2.0000 g | Freq: Once | INTRAVENOUS | Status: AC
  Administered 2013-06-17: 2 g via INTRAVENOUS
  Filled 2013-06-17: qty 50

## 2013-06-17 MED ORDER — MIDAZOLAM HCL 2 MG/2ML IJ SOLN
INTRAMUSCULAR | Status: AC | PRN
  Administered 2013-06-17: 1 mg via INTRAVENOUS

## 2013-06-17 MED ORDER — CEFAZOLIN SODIUM-DEXTROSE 2-3 GM-% IV SOLR
2.0000 g | Freq: Three times a day (TID) | INTRAVENOUS | Status: DC
  Filled 2013-06-17 (×3): qty 50

## 2013-06-17 MED ORDER — MIDAZOLAM HCL 2 MG/2ML IJ SOLN
INTRAMUSCULAR | Status: AC
  Filled 2013-06-17: qty 6

## 2013-06-17 MED ORDER — HYDRALAZINE HCL 25 MG PO TABS
25.0000 mg | ORAL_TABLET | Freq: Four times a day (QID) | ORAL | Status: DC | PRN
  Administered 2013-06-17: 25 mg via ORAL
  Filled 2013-06-17: qty 1

## 2013-06-17 MED ORDER — FENTANYL CITRATE 0.05 MG/ML IJ SOLN
INTRAMUSCULAR | Status: AC | PRN
  Administered 2013-06-17: 50 ug via INTRAVENOUS

## 2013-06-17 MED ORDER — HEPARIN SODIUM (PORCINE) 1000 UNIT/ML IJ SOLN
INTRAMUSCULAR | Status: AC
  Filled 2013-06-17: qty 1

## 2013-06-17 MED ORDER — GI COCKTAIL ~~LOC~~
30.0000 mL | Freq: Once | ORAL | Status: DC
  Filled 2013-06-17 (×2): qty 30

## 2013-06-17 MED ORDER — BACLOFEN 5 MG HALF TABLET
5.0000 mg | ORAL_TABLET | Freq: Once | ORAL | Status: AC
  Administered 2013-06-17: 5 mg via ORAL
  Filled 2013-06-17: qty 1

## 2013-06-17 MED ORDER — FENTANYL CITRATE 0.05 MG/ML IJ SOLN
INTRAMUSCULAR | Status: AC
  Filled 2013-06-17: qty 4

## 2013-06-17 MED ORDER — CHLORHEXIDINE GLUCONATE 4 % EX LIQD
CUTANEOUS | Status: AC
  Filled 2013-06-17: qty 15

## 2013-06-17 MED ORDER — DOXERCALCIFEROL 4 MCG/2ML IV SOLN
INTRAVENOUS | Status: AC
  Filled 2013-06-17: qty 2

## 2013-06-17 NOTE — Progress Notes (Signed)
06/17/2013 10:48 AM Hemodialysis Outpatient Note; this patient has been accepted at the [place] center on a Tuesday, Thursday and Saturday 2nd shift schedule. This patient can begin treatment at the center on Tuesday December 2,2014. Please have patient arrive at the center for 10:30 AM to sign paperwork and consents. Thank you.Gordy Savers

## 2013-06-17 NOTE — Progress Notes (Signed)
[facility] PROGRESS NOTE  [name] [id number] DOB: [birth year] DOA: 06/15/2013 PCP: Tivis Ringer, MD  Assessment/Plan: Anemia: probably from the CKD stage 4. S/p 2 units during HD  CKD stage 4: HD started 11/17 after catheter placed by IR , Nephro following  Stage 1 breast cancer: s/p lumpectomy , 4 cycles of chemotherapy and radiation therpy. Further management as per oncology.   Sob: probably fluid overload from CKD , improved after HD   Hypertension: improved. Hydralazine PRN   Diabetes Mellitus: SSI  Upper abd soreness- AST/ALT ok, check lipase, try GI cocktail- has been going on for > 2 months  Code Status: full Family Communication: patient Disposition Plan:    Consultants:  renal  Procedures:  HD started 11/26  Antibiotics:    HPI/Subjective: Cramping in legs last night Sore in her RUQ  Objective: Filed Vitals:   06/17/13 0541  BP: 110/78  Pulse: 88  Temp: 98.8 F (37.1 C)  Resp: 18    Intake/Output Summary (Last 24 hours) at 06/17/13 0826 Last data filed at 06/17/13 0542  Gross per 24 hour  Intake    840 ml  Output      1 ml  Net    839 ml   Filed Weights   06/15/13 2027 06/15/13 2258 06/16/13 2109  Weight: 100.2 kg (220 lb 14.4 oz) 98.7 kg (217 lb 9.5 oz) 98.7 kg (217 lb 9.5 oz)    Exam:   General:  Pleasant, NAD  Cardiovascular: rrr  Respiratory: clear anterior  Abdomen: +BS, soft, min RUQ tenderness  Musculoskeletal: moves all 4 ext  Data Reviewed: Basic Metabolic Panel:  Recent Labs Lab 06/13/13 1037 06/15/13 1220 06/15/13 1730 06/16/13 0520 06/17/13 0550  NA 141 142  --  138 139  K 4.4 4.0  --  4.0 4.2  CL  --  107  --  103 103  CO2 20* 21  --  23 22  GLUCOSE 124 65*  --  112* 133*  BUN 93.4* 77*  --  54* 61*  CREATININE 9.3* 8.71* 8.79* 6.72* 8.15*  CALCIUM 8.8 8.6  --  8.5 8.9  PHOS  --   --   --  4.4 4.8*   Liver Function Tests:  Recent Labs Lab 06/13/13 1037 06/17/13 0550  AST  21  --   ALT 28  --   ALKPHOS 74  --   BILITOT 0.22  --   PROT 7.0  --   ALBUMIN 2.8* 2.9*   No results found for this basename: LIPASE, AMYLASE,  in the last 168 hours No results found for this basename: AMMONIA,  in the last 168 hours CBC:  Recent Labs Lab 06/13/13 1037 06/15/13 1220 06/15/13 1730 06/16/13 0520 06/17/13 0550  WBC 7.6 7.9 6.3 8.3 9.3  NEUTROABS 5.2 4.5  --   --   --   HGB 6.5* 6.7* 6.3* 9.1* 8.9*  HCT 20.6* 21.3* 19.4* 27.0* 26.0*  MCV 83.4 84.9 82.9 84.4 83.3  PLT 367 421* 404* 366 340   Cardiac Enzymes:  Recent Labs Lab 06/15/13 1220  TROPONINI <0.30   BNP (last 3 results) No results found for this basename: PROBNP,  in the last 8760 hours CBG:  Recent Labs Lab 06/16/13 0735 06/16/13 1241 06/16/13 1612 06/16/13 2119 06/17/13 0732  GLUCAP 114* 176* 141* 255* 117*    No results found for this or any previous visit (from the past 240 hour(s)).   Studies: Dg Chest 2 View  06/15/2013  CLINICAL DATA:  Shortness of breath, weakness, history hypertension, diabetes, renal failure, breast cancer  EXAM: CHEST  2 VIEW  COMPARISON:  06/06/2013  FINDINGS: Enlargement of cardiac silhouette with pulmonary vascular congestion.  Mild perihilar interstitial infiltrates question pulmonary edema.  No segmental consolidation, pleural effusion or pneumothorax.  Bones demineralized with endplate spur formation thoracic spine.  IMPRESSION: Probable CHF.   Electronically Signed   By: Lavonia Dana M.D.   On: 06/15/2013 12:31    Scheduled Meds: . amLODipine  10 mg Oral QHS  . aspirin EC  81 mg Oral Daily  . [START ON 06/22/2013] darbepoetin (ARANESP) injection - DIALYSIS  200 mcg Intravenous Q Wed-HD  . doxercalciferol  1 mcg Intravenous Q M,W,F-HD  . gi cocktail  30 mL Oral Once  . insulin aspart  0-9 Units Subcutaneous TID WC  . kidney failure book   Does not apply Once  . multivitamin  1 tablet Oral QHS  . pantoprazole  40 mg Oral Daily   Continuous  Infusions:   Active Problems:   Malignant neoplasm of upper-inner quadrant of female breast   Chronic kidney disease (CKD), stage IV (severe)   Anemia   CHF (congestive heart failure)   Diabetes    Time spent: 35 min    VANN, JESSICA  [facility] Pager [phone]. If 7PM-7AM, please contact night-coverage at [email or web address], password [facility] 06/17/2013, 8:26 AM  LOS: 2 days

## 2013-06-17 NOTE — H&P (Signed)
[name] is an 62 y.o. female.   Chief Complaint:  L IJ temporary dialysis catheter placed in IR 11/26 Pt had come through ER with SOB and anemia that day Anemia improved: H/H- 8.9/26 SOB resolved Scheduled now for tunneled IJ catheter for dialysis Plan for dc soon Pt with hx breast ca Chronic renal failure Worsening renal function Rt arm AV graft placed 11/17- not mature to use  HPI: ESRD; GERD; DM; Breast Ca  Past Medical History  Diagnosis Date  . Hypertension   . GERD (gastroesophageal reflux disease)   . Anemia   . Myalgia 12/31/2011  . Neuropathy 12/31/2011  . Shortness of breath     "lately it's been all the time" (06/15/2013)  . Type II diabetes mellitus   . Arthritis     "joints" (06/15/2013)  . Breast cancer     left  . Anxiety   . ESRD (end stage renal disease)     "suppose to start dialysis today" (06/15/2013)    Past Surgical History  Procedure Laterality Date  . Cesarean section  1980  . Tonsillectomy    . Av fistula placement Right 06/06/2013    Procedure: ARTERIOVENOUS (AV) FISTULA CREATION-RIGHT BRACHIAL CEPHALIC;  Surgeon: Conrad Harwood, MD;  Location: [place];  Service: Vascular;  Laterality: Right;  . Removal of Kane dialysis catheter Right 06/06/2013    Procedure: REMOVAL OF RIGHT MEDIPORT;  Surgeon: Conrad Clayton, MD;  Location: [place];  Service: Vascular;  Laterality: Right;  . Cataract extraction w/ anterior vitrectomy Bilateral   . Refractive surgery Bilateral   . Eye surgery Bilateral     laser surgery  . Abdominal hysterectomy      partial  . Breast biopsy Left   . Breast lumpectomy Left     "and took out some lymph nodes" (06/15/2013)    Family History  Problem Relation Age of Onset  . Diabetes Mother   . Hyperlipidemia Mother   . Hypertension Mother   . Hypertension Father   . Diabetes Sister   . Diabetes Brother   . Hypertension Brother   . Heart attack Brother    Social History:  reports that she has never smoked. She has  never used smokeless tobacco. She reports that she does not drink alcohol or use illicit drugs.  Allergies: No Known Allergies  Medications Prior to Admission  Medication Sig Dispense Refill  . acetaminophen (TYLENOL) 325 MG tablet Take 650 mg by mouth every 6 (six) hours as needed.      Marland Kitchen amLODipine (NORVASC) 10 MG tablet Take 10 mg by mouth daily.      Marland Kitchen aspirin 81 MG tablet Take 81 mg by mouth daily.        . calcitRIOL (ROCALTROL) 0.25 MCG capsule Take 0.25-0.5 mcg by mouth daily. Patient takes 0.5 mcg on even days and 0.25 mcg on odd days.      . furosemide (LASIX) 80 MG tablet Take 80 mg by mouth 2 (two) times daily.      . insulin glargine (LANTUS) 100 UNIT/ML injection Inject 50 Units into the skin daily.       . insulin lispro (HUMALOG KWIKPEN) 100 UNIT/ML SOPN Inject 12-16 Units into the skin 2 (two) times daily. SSI      . omeprazole (PRILOSEC) 20 MG capsule Take 20 mg by mouth daily.      Marland Kitchen OVER THE COUNTER MEDICATION Take 2 capsules by mouth daily. Stool [place]  Results for orders placed during the hospital encounter of 06/15/13 (from the past 48 hour(s))  CBC WITH DIFFERENTIAL     Status: Abnormal   Collection Time    06/15/13 12:20 PM      Result Value Range   WBC 7.9  4.0 - 10.5 K/uL   RBC 2.51 (*) 3.87 - 5.11 MIL/uL   Hemoglobin 6.7 (*) 12.0 - 15.0 g/dL   Comment: CRITICAL RESULT CALLED TO, READ BACK BY AND VERIFIED WITH:     C ERICSON,RN AT 1317 06/15/13 BY K BARR   HCT 21.3 (*) 36.0 - 46.0 %   MCV 84.9  78.0 - 100.0 fL   MCH 26.7  26.0 - 34.0 pg   MCHC 31.5  30.0 - 36.0 g/dL   RDW 16.1 (*) 11.5 - 15.5 %   Platelets 421 (*) 150 - 400 K/uL   Neutrophils Relative % 57  43 - 77 %   Neutro Abs 4.5  1.7 - 7.7 K/uL   Lymphocytes Relative 31  12 - 46 %   Lymphs Abs 2.4  0.7 - 4.0 K/uL   Monocytes Relative 8  3 - 12 %   Monocytes Absolute 0.7  0.1 - 1.0 K/uL   Eosinophils Relative 3  0 - 5 %   Eosinophils Absolute 0.3  0.0 - 0.7 K/uL   Basophils Relative 1  0  - 1 %   Basophils Absolute 0.1  0.0 - 0.1 K/uL  BASIC METABOLIC PANEL     Status: Abnormal   Collection Time    06/15/13 12:20 PM      Result Value Range   Sodium 142  135 - 145 mEq/L   Potassium 4.0  3.5 - 5.1 mEq/L   Chloride 107  96 - 112 mEq/L   CO2 21  19 - 32 mEq/L   Glucose, Bld 65 (*) 70 - 99 mg/dL   BUN 77 (*) 6 - 23 mg/dL   Creatinine, Ser 8.71 (*) 0.50 - 1.10 mg/dL   Calcium 8.6  8.4 - 10.5 mg/dL   GFR calc non Af Amer 4 (*) >90 mL/min   GFR calc Af Amer 5 (*) >90 mL/min   Comment: (NOTE)     The eGFR has been calculated using the CKD EPI equation.     This calculation has not been validated in all clinical situations.     eGFR's persistently <90 mL/min signify possible Chronic Kidney     Disease.  TROPONIN I     Status: None   Collection Time    06/15/13 12:20 PM      Result Value Range   Troponin I <0.30  <0.30 ng/mL   Comment:            Due to the release kinetics of cTnI,     Kane negative result within the first hours     of the onset of symptoms does not rule out     myocardial infarction with certainty.     If myocardial infarction is still suspected,     repeat the test at appropriate intervals.  TYPE AND SCREEN     Status: None   Collection Time    06/15/13  2:10 PM      Result Value Range   ABO/RH(D) Kane POS     Antibody Screen NEG     Sample Expiration 06/18/2013     Unit Number [id number]     Blood Component Type RED CELLS,LR     Unit  division 00     Status of Unit REL FROM [facility]     Transfusion Status OK TO TRANSFUSE     Crossmatch Result Compatible     Unit Number [id number]     Blood Component Type RED CELLS,LR     Unit division 00     Status of Unit ISSUED,FINAL     Transfusion Status OK TO TRANSFUSE     Crossmatch Result Compatible     Unit Number [id number]     Blood Component Type RED CELLS,LR     Unit division 00     Status of Unit ISSUED,FINAL     Transfusion Status OK TO TRANSFUSE     Crossmatch Result Compatible     PREPARE RBC (CROSSMATCH)     Status: None   Collection Time    06/15/13  2:10 PM      Result Value Range   Order Confirmation ORDER PROCESSED BY BLOOD BANK    ABO/RH     Status: None   Collection Time    06/15/13  2:10 PM      Result Value Range   ABO/RH(D) Kane POS    FERRITIN     Status: Abnormal   Collection Time    06/15/13  5:30 PM      Result Value Range   Ferritin 333 (*) 10 - 291 ng/mL   Comment: Performed at [place] [facility]     Status: Abnormal   Collection Time    06/15/13  5:30 PM      Result Value Range   Iron 34 (*) 42 - 135 ug/dL   TIBC 238 (*) 250 - 470 ug/dL   Saturation Ratios 14 (*) 20 - 55 %   UIBC 204  125 - 400 ug/dL   Comment: Performed at [facility]  CBC     Status: Abnormal   Collection Time    06/15/13  5:30 PM      Result Value Range   WBC 6.3  4.0 - 10.5 K/uL   RBC 2.34 (*) 3.87 - 5.11 MIL/uL   Hemoglobin 6.3 (*) 12.0 - 15.0 g/dL   Comment: REPEATED TO VERIFY     CRITICAL VALUE NOTED.  VALUE IS CONSISTENT WITH PREVIOUSLY REPORTED AND CALLED VALUE.   HCT 19.4 (*) 36.0 - 46.0 %   MCV 82.9  78.0 - 100.0 fL   MCH 26.9  26.0 - 34.0 pg   MCHC 32.5  30.0 - 36.0 g/dL   RDW 16.4 (*) 11.5 - 15.5 %   Platelets 404 (*) 150 - 400 K/uL  CREATININE, SERUM     Status: Abnormal   Collection Time    06/15/13  5:30 PM      Result Value Range   Creatinine, Ser 8.79 (*) 0.50 - 1.10 mg/dL   GFR calc non Af Amer 4 (*) >90 mL/min   GFR calc Af Amer 5 (*) >90 mL/min   Comment: (NOTE)     The eGFR has been calculated using the CKD EPI equation.     This calculation has not been validated in all clinical situations.     eGFR's persistently <90 mL/min signify possible Chronic Kidney     Disease.  HEPATITIS B SURFACE ANTIBODY     Status: None   Collection Time    06/15/13  9:40 PM      Result Value Range   Hep B S Ab NEGATIVE  NEGATIVE   Comment: Performed at  [facility]  GLUCOSE, CAPILLARY     Status: Abnormal    Collection Time    06/16/13 12:07 AM      Result Value Range   Glucose-Capillary 64 (*) 70 - 99 mg/dL  GLUCOSE, CAPILLARY     Status: Abnormal   Collection Time    06/16/13  1:00 AM      Result Value Range   Glucose-Capillary 130 (*) 70 - 99 mg/dL  BASIC METABOLIC PANEL     Status: Abnormal   Collection Time    06/16/13  5:20 AM      Result Value Range   Sodium 138  135 - 145 mEq/L   Potassium 4.0  3.5 - 5.1 mEq/L   Chloride 103  96 - 112 mEq/L   CO2 23  19 - 32 mEq/L   Glucose, Bld 112 (*) 70 - 99 mg/dL   BUN 54 (*) 6 - 23 mg/dL   Creatinine, Ser 6.72 (*) 0.50 - 1.10 mg/dL   Calcium 8.5  8.4 - 10.5 mg/dL   GFR calc non Af Amer 6 (*) >90 mL/min   GFR calc Af Amer 7 (*) >90 mL/min   Comment: (NOTE)     The eGFR has been calculated using the CKD EPI equation.     This calculation has not been validated in all clinical situations.     eGFR's persistently <90 mL/min signify possible Chronic Kidney     Disease.  CBC     Status: Abnormal   Collection Time    06/16/13  5:20 AM      Result Value Range   WBC 8.3  4.0 - 10.5 K/uL   RBC 3.20 (*) 3.87 - 5.11 MIL/uL   Hemoglobin 9.1 (*) 12.0 - 15.0 g/dL   Comment: POST TRANSFUSION SPECIMEN   HCT 27.0 (*) 36.0 - 46.0 %   MCV 84.4  78.0 - 100.0 fL   MCH 28.4  26.0 - 34.0 pg   MCHC 33.7  30.0 - 36.0 g/dL   RDW 15.5  11.5 - 15.5 %   Platelets 366  150 - 400 K/uL  HEPATITIS B SURFACE ANTIGEN     Status: None   Collection Time    06/16/13  5:20 AM      Result Value Range   Hepatitis B Surface Ag NEGATIVE  NEGATIVE   Comment: Performed at [place], TOTAL     Status: None   Collection Time    06/16/13  5:20 AM      Result Value Range   Hep B Core Total Ab NON REACTIVE  NON REACTIVE   Comment: Performed at [place]     Status: None   Collection Time    06/16/13  5:20 AM      Result Value Range   Phosphorus 4.4  2.3 - 4.6 mg/dL  GLUCOSE, CAPILLARY     Status: Abnormal    Collection Time    06/16/13  7:35 AM      Result Value Range   Glucose-Capillary 114 (*) 70 - 99 mg/dL   Comment 1 Documented in Chart     Comment 2 Notify RN    GLUCOSE, CAPILLARY     Status: Abnormal   Collection Time    06/16/13 12:41 PM      Result Value Range   Glucose-Capillary 176 (*) 70 - 99 mg/dL   Comment 1 Notify RN     Comment  2 Documented in Chart    GLUCOSE, CAPILLARY     Status: Abnormal   Collection Time    06/16/13  4:12 PM      Result Value Range   Glucose-Capillary 141 (*) 70 - 99 mg/dL  GLUCOSE, CAPILLARY     Status: Abnormal   Collection Time    06/16/13  9:19 PM      Result Value Range   Glucose-Capillary 255 (*) 70 - 99 mg/dL  RENAL FUNCTION PANEL     Status: Abnormal   Collection Time    06/17/13  5:50 AM      Result Value Range   Sodium 139  135 - 145 mEq/L   Potassium 4.2  3.5 - 5.1 mEq/L   Chloride 103  96 - 112 mEq/L   CO2 22  19 - 32 mEq/L   Glucose, Bld 133 (*) 70 - 99 mg/dL   BUN 61 (*) 6 - 23 mg/dL   Creatinine, Ser 8.15 (*) 0.50 - 1.10 mg/dL   Calcium 8.9  8.4 - 10.5 mg/dL   Phosphorus 4.8 (*) 2.3 - 4.6 mg/dL   Albumin 2.9 (*) 3.5 - 5.2 g/dL   GFR calc non Af Amer 5 (*) >90 mL/min   GFR calc Af Amer 6 (*) >90 mL/min   Comment: (NOTE)     The eGFR has been calculated using the CKD EPI equation.     This calculation has not been validated in all clinical situations.     eGFR's persistently <90 mL/min signify possible Chronic Kidney     Disease.  CBC     Status: Abnormal   Collection Time    06/17/13  5:50 AM      Result Value Range   WBC 9.3  4.0 - 10.5 K/uL   RBC 3.12 (*) 3.87 - 5.11 MIL/uL   Hemoglobin 8.9 (*) 12.0 - 15.0 g/dL   HCT 26.0 (*) 36.0 - 46.0 %   MCV 83.3  78.0 - 100.0 fL   MCH 28.5  26.0 - 34.0 pg   MCHC 34.2  30.0 - 36.0 g/dL   RDW 15.7 (*) 11.5 - 15.5 %   Platelets 340  150 - 400 K/uL  GLUCOSE, CAPILLARY     Status: Abnormal   Collection Time    06/17/13  7:32 AM      Result Value Range   Glucose-Capillary  117 (*) 70 - 99 mg/dL   Comment 1 Documented in Chart     Comment 2 Notify RN     Dg Chest 2 View  06/15/2013   CLINICAL DATA:  Shortness of breath, weakness, history hypertension, diabetes, renal failure, breast cancer  EXAM: CHEST  2 VIEW  COMPARISON:  06/06/2013  FINDINGS: Enlargement of cardiac silhouette with pulmonary vascular congestion.  Mild perihilar interstitial infiltrates question pulmonary edema.  No segmental consolidation, pleural effusion or pneumothorax.  Bones demineralized with endplate spur formation thoracic spine.  IMPRESSION: Probable CHF.   Electronically Signed   By: Lavonia Dana M.D.   On: 06/15/2013 12:31    Review of Systems  Constitutional: Negative for fever.  Respiratory: Negative for shortness of breath.   Cardiovascular: Negative for chest pain.  Gastrointestinal: Negative for nausea, vomiting and abdominal pain.  Neurological: Negative for weakness.    Blood pressure 110/78, pulse 88, temperature 98.8 F (37.1 C), temperature source Oral, resp. rate 18, height 5\' 3"  (1.6 m), weight 217 lb 9.5 oz (98.7 kg), SpO2 96.00%. Physical Exam  Constitutional: She  is oriented to person, place, and time. She appears well-developed and well-nourished.  Cardiovascular: Normal rate, regular rhythm and normal heart sounds.   No murmur heard. Respiratory: Effort normal and breath sounds normal. She has no wheezes.  Existing temp dialysis catheter- L IJ  GI: Soft. Bowel sounds are normal. There is no tenderness.  Musculoskeletal: Normal range of motion.  Neurological: She is alert and oriented to person, place, and time. Coordination normal.  Skin: Skin is warm and dry.  Psychiatric: She has Kane normal mood and affect. Her behavior is normal. Judgment and thought content normal.     Assessment/Plan Chronic kidney disease Worsening renal failure  Temp cath placed in IR 11/26 Need conversion to tunneled cath today Pt aware of procedure benefits and risks and agreeable  to proceed Consent signed and in chart  [name] 06/17/2013, 9:13 AM

## 2013-06-17 NOTE — Progress Notes (Signed)
Patient ID: [name], female   DOB: [birth year], 63 y.o.   MRN: [id number]   [facility] Progress Note    Assessment/ Plan:   1. Symptomatic Anemia (recent surgery with preceding anemia of chronic kidney disease): Hemoglobin improved status post blood transfusion. Hemoglobin currently acceptable, given intravenous iron and started back on ESA. 2. Chronic kidney disease stage V with progression to end-stage renal disease-now on maintenance hemodialysis. For conversion to tunneled hemodialysis cath today by interventional radiology-she currently n.p.o. Dialysis thereafter. Awaiting decision from the central placement with regards to her outpatient dialysis schedule. Plan for next dialysis again tomorrow. 3. Metabolic bone disease: Phosphorus at goal-no indications at this time for phosphorus binder-restarted on vitamin D receptor analog (converted to intravenous from by mouth) 4. Hypertension: Improve with ultrafiltration and resumption of antihypertensive therapy-continue to monitor closely for further adjustments   Subjective:   Reports some shortness of breath last night with cramping of upper and lower extremities-cramping improved with warm compresses.    Objective:   BP 110/78  Pulse 88  Temp(Src) 98.8 F (37.1 C) (Oral)  Resp 18  Ht 5\' 3"  (1.6 m)  Wt 98.7 kg (217 lb 9.5 oz)  BMI 38.55 kg/m2  SpO2 96%  Intake/Output Summary (Last 24 hours) at 06/17/13 1017 Last data filed at 06/17/13 0542  Gross per 24 hour  Intake    840 ml  Output      1 ml  Net    839 ml   Weight change: -2.724 kg (-6 lb 0.1 oz)  Physical Exam: Gen: Comfortably resting on the side of her bed CVS: Pulse regular in rate and rhythm, heart sounds S1 and S2 with an ejection systolic murmur Resp: Clear to auscultation bilaterally-no rales/rhonchi Abd: Soft, nontender, bowel sounds are normal Ext: Trace to 1+ lower extremity edema  Imaging: Dg Chest 2 View  06/15/2013   CLINICAL  DATA:  Shortness of breath, weakness, history hypertension, diabetes, renal failure, breast cancer  EXAM: CHEST  2 VIEW  COMPARISON:  06/06/2013  FINDINGS: Enlargement of cardiac silhouette with pulmonary vascular congestion.  Mild perihilar interstitial infiltrates question pulmonary edema.  No segmental consolidation, pleural effusion or pneumothorax.  Bones demineralized with endplate spur formation thoracic spine.  IMPRESSION: Probable CHF.   Electronically Signed   By: Lavonia Dana M.D.   On: 06/15/2013 12:31    Labs: BMET  Recent Labs Lab 06/13/13 1037 06/15/13 1220 06/15/13 1730 06/16/13 0520 06/17/13 0550  NA 141 142  --  138 139  K 4.4 4.0  --  4.0 4.2  CL  --  107  --  103 103  CO2 20* 21  --  23 22  GLUCOSE 124 65*  --  112* 133*  BUN 93.4* 77*  --  54* 61*  CREATININE 9.3* 8.71* 8.79* 6.72* 8.15*  CALCIUM 8.8 8.6  --  8.5 8.9  PHOS  --   --   --  4.4 4.8*   CBC  Recent Labs Lab 06/13/13 1037 06/15/13 1220 06/15/13 1730 06/16/13 0520 06/17/13 0550  WBC 7.6 7.9 6.3 8.3 9.3  NEUTROABS 5.2 4.5  --   --   --   HGB 6.5* 6.7* 6.3* 9.1* 8.9*  HCT 20.6* 21.3* 19.4* 27.0* 26.0*  MCV 83.4 84.9 82.9 84.4 83.3  PLT 367 421* 404* 366 340    Medications:    . amLODipine  10 mg Oral QHS  . aspirin EC  81 mg Oral Daily  .  ceFAZolin (ANCEF) IV  2 g Intravenous Once  . [START ON 06/22/2013] darbepoetin (ARANESP) injection - DIALYSIS  200 mcg Intravenous Q Wed-HD  . doxercalciferol  1 mcg Intravenous Q M,W,F-HD  . gi cocktail  30 mL Oral Once  . insulin aspart  0-9 Units Subcutaneous TID WC  . kidney failure book   Does not apply Once  . multivitamin  1 tablet Oral QHS  . pantoprazole  40 mg Oral Daily   [name], MD 06/17/2013, 10:17 AM

## 2013-06-17 NOTE — Procedures (Signed)
Successful fluoroscopic guided exchange of left jugular approach temp catheter to a permanent tunneled dialysis catheter.  Catheter is ready for immediate use.  No immediate post procedural complications.

## 2013-06-18 DIAGNOSIS — N186 End stage renal disease: Secondary | ICD-10-CM

## 2013-06-18 LAB — RENAL FUNCTION PANEL
Albumin: 3 g/dL — ABNORMAL LOW (ref 3.5–5.2)
BUN: 47 mg/dL — ABNORMAL HIGH (ref 6–23)
CO2: 24 mEq/L (ref 19–32)
Calcium: 8.9 mg/dL (ref 8.4–10.5)
GFR calc Af Amer: 6 mL/min — ABNORMAL LOW (ref >90)
Glucose, Bld: 191 mg/dL — ABNORMAL HIGH (ref 70–99)
Sodium: 132 mEq/L — ABNORMAL LOW (ref 135–145)

## 2013-06-18 LAB — CBC
HCT: 25.6 % — ABNORMAL LOW (ref 36.0–46.0)
Hemoglobin: 8.7 g/dL — ABNORMAL LOW (ref 12.0–15.0)
MCH: 28.4 pg (ref 26.0–34.0)
MCV: 83.7 fL (ref 78.0–100.0)
RBC: 3.06 MIL/uL — ABNORMAL LOW (ref 3.87–5.11)
RDW: 15.6 % — ABNORMAL HIGH (ref 11.5–15.5)
WBC: 9.1 10*3/uL (ref 4.0–10.5)

## 2013-06-18 LAB — GLUCOSE, CAPILLARY

## 2013-06-18 MED ORDER — DARBEPOETIN ALFA-POLYSORBATE 200 MCG/0.4ML IJ SOLN: 200.0000 ug | INTRAMUSCULAR | Status: AC

## 2013-06-18 MED ORDER — CYCLOBENZAPRINE HCL 10 MG PO TABS
5.0000 mg | ORAL_TABLET | Freq: Two times a day (BID) | ORAL | Status: DC | PRN
  Administered 2013-06-18 (×2): 5 mg via ORAL
  Filled 2013-06-18 (×2): qty 1

## 2013-06-18 MED ORDER — CYCLOBENZAPRINE HCL 5 MG PO TABS: 5.0000 mg | ORAL_TABLET | Freq: Two times a day (BID) | ORAL | Status: DC | PRN

## 2013-06-18 MED ORDER — DOXERCALCIFEROL 4 MCG/2ML IV SOLN: 1.0000 ug | INTRAVENOUS | Status: AC

## 2013-06-18 MED ORDER — HYDRALAZINE HCL 25 MG PO TABS: 25.0000 mg | ORAL_TABLET | Freq: Three times a day (TID) | ORAL | Status: DC

## 2013-06-18 NOTE — Procedures (Signed)
Patient seen on Hemodialysis. QB 300, UF goal 1500 Treatment adjusted as needed.  [name] MD [facility]. Office # [phone] Pager # [phone] 5:10 PM

## 2013-06-18 NOTE — Progress Notes (Signed)
Discharge home with family via wheelchair.

## 2013-06-18 NOTE — Progress Notes (Signed)
Patient ID: [name], female   DOB: 03-Aug-1950, 62 y.o.   MRN: [id number]   [facility] Progress Note    Assessment/ Plan:   1. Symptomatic Anemia (recent surgery with preceding anemia of chronic kidney disease): Hemoglobin improved status post blood transfusion. Status post intravenous iron, now on ESA 2. Chronic kidney disease stage V with progression to end-stage renal disease-now on maintenance hemodialysis. Dialysis today with possible discharge thereafter. She has been placed to the [facility] on a Tuesday, Thursday and Saturday schedule second shift. 3. Metabolic bone disease: Phosphorus at goal-no indications at this time for phosphorus binder-restarted on vitamin D receptor analog (converted to intravenous from by mouth)  4. Hypertension: Improve with ultrafiltration and resumption of antihypertensive therapy-continue to monitor closely for further adjustments  Subjective:   Reports some cramping in her lower extremities overnight. Tolerable pain over left chest-status post conversion to tunneled dialysis catheter from temporary. She denies shortness of breath.    Objective:   BP 192/83  Pulse 97  Temp(Src) 98.8 F (37.1 C) (Oral)  Resp 18  Ht 5\' 3"  (1.6 m)  Wt 97.2 kg (214 lb 4.6 oz)  BMI 37.97 kg/m2  SpO2 96%  Intake/Output Summary (Last 24 hours) at 06/18/13 [id number] Last data filed at 06/18/13 0547  Gross per 24 hour  Intake    360 ml  Output      1 ml  Net    359 ml   Weight change: 0 kg (0 lb)  Physical Exam: Gen: Comfortably sitting up on the side of her bed CVS: Pulse regular in rate and rhythm, S1 and S2 with an ejection systolic murmur Resp: Clear to auscultation bilaterally-no rales Abd: Soft, obese, nontender Ext: One plus lower extremity edema Tunneled dialysis catheter left IJ with dressing over it.  Imaging: Ir Fluoro Guide Cv Line Left  06/17/2013   INDICATION: End-stage renal disease, in need of a permanent  access for the continuation of hemodialysis.  EXAM: FLUOROSCOPIC GUIDED CONVERSION OF EXISTING LEFT JUGULAR APPROACH DIALYSIS CATHETER TO A PERMANENT TUNNELED DIALYSIS CATHETER  MEDICATIONS: Ancef 2 g IV; Versed at 1 mg IV; Fentanyl 50 mg IV  CONTRAST:  None  COMPARISON:  Ultrasound fluoroscopic guided temporary dialysis catheter placement - 06/15/2013  FLUOROSCOPY TIME:  1 minute 12 seconds  PROCEDURE: Informed written consent was obtained from the patient after a discussion of the risks, benefits, and alternatives to treatment. Questions regarding the procedure were encouraged and answered. The skin and external portion of the existing temporary hemodialysis catheter was prepped with chlorhexidine in a sterile fashion, and a sterile drape was applied covering the operative field. Maximum barrier sterile technique with sterile gowns and gloves were used for the procedure. A timeout was performed prior to the initiation of the procedure.  A stiff Glidewire was cannulated within the existing temporary dialysis catheter and utilized for measurement purposes. A 23 cm tip to cuff HemoSplit dialysis catheter was tunneled from a location of the left anterior chest to the temporary dialysis catheter access site. Under intermittent fluoroscopic guidance, a stiff Glidewire was advanced through the temporary dialysis catheter to the level of the IVC.  Under intermittent fluoroscopic guidance, the temporary dialysis catheter was exchanged for a peel-away sheath. The tunneled dialysis catheter was inserted through the peel-away sheath with tips ultimately terminating within the superior aspect of the right atrium. Final catheter positioning was confirmed and documented with a spot radiographic image. The catheter aspirates and flushes normally. The  catheter was flushed with appropriate volume heparin dwells.  The catheter exit site was secured with a 0-Prolene retention sutures. Both lumens were heparinized. A dressing was  placed. The patient tolerated the procedure well without immediate post procedural complication.  COMPLICATIONS: None immediate  IMPRESSION: Successful fluoroscopic guided conversion of a temporary left jugular approach dialysis catheter to a 23 cm tip to cuff tunneled hemodialysis catheter with tips terminating within the superior aspect of the right atrium. The catheter is ready for immediate use.   Electronically Signed   By: Sandi Mariscal M.D.   On: 06/17/2013 16:52    Labs: BMET  Recent Labs Lab 06/13/13 1037 06/15/13 1220 06/15/13 1730 06/16/13 0520 06/17/13 0550  NA 141 142  --  138 139  K 4.4 4.0  --  4.0 4.2  CL  --  107  --  103 103  CO2 20* 21  --  23 22  GLUCOSE 124 65*  --  112* 133*  BUN 93.4* 77*  --  54* 61*  CREATININE 9.3* 8.71* 8.79* 6.72* 8.15*  CALCIUM 8.8 8.6  --  8.5 8.9  PHOS  --   --   --  4.4 4.8*   CBC  Recent Labs Lab 06/13/13 1037 06/15/13 1220 06/15/13 1730 06/16/13 0520 06/17/13 0550  WBC 7.6 7.9 6.3 8.3 9.3  NEUTROABS 5.2 4.5  --   --   --   HGB 6.5* 6.7* 6.3* 9.1* 8.9*  HCT 20.6* 21.3* 19.4* 27.0* 26.0*  MCV 83.4 84.9 82.9 84.4 83.3  PLT 367 421* 404* 366 340   Medications:    . amLODipine  10 mg Oral QHS  . aspirin EC  81 mg Oral Daily  . [START ON 06/22/2013] darbepoetin (ARANESP) injection - DIALYSIS  200 mcg Intravenous Q Wed-HD  . doxercalciferol  1 mcg Intravenous Q M,W,F-HD  . gi cocktail  30 mL Oral Once  . insulin aspart  0-9 Units Subcutaneous TID WC  . kidney failure book   Does not apply Once  . multivitamin  1 tablet Oral QHS  . pantoprazole  40 mg Oral Daily   [name], MD 06/18/2013, 9:52 AM

## 2013-06-18 NOTE — Discharge Summary (Addendum)
Physician Discharge Summary  [name] [id number] DOB: [birth year] DOA: 06/15/2013  PCP: Cathy Ringer, MD  Admit date: 06/15/2013 Discharge date: 06/18/2013  Time spent: 35 minutes  Recommendations for Outpatient Follow-up:  1. HD per nephro  Discharge Diagnoses:  Active Problems:   Chronic kidney disease (CKD), stage IV (severe)-- NOW ESRD   Anemia from CKD SOB from fluid and anemia- resolved   Diabetes   Discharge Condition: improved  Diet recommendation: renal/diabetic  Filed Weights   06/17/13 1046 06/17/13 1329 06/17/13 2159  Weight: 98.7 kg (217 lb 9.5 oz) 97.2 kg (214 lb 4.6 oz) 97.2 kg (214 lb 4.6 oz)    History of present illness:  [name] is a 62 y.o. female with prior h/o hypertension, CHF, DM, stage 4 CKD, not on HD yet, came in for sob since last night. On arrival to ED, she was found to be tachypnic, her CXR showed mild perihilar interstitial infiltrates consistent with pulm edema. She was also found to be anemic with hemoglobin of 6.7. She was referred to medical service for admission. She denies any chest pain or orthopnea or PND. Mild pedal edema. She recently had a right brachiocephalic fistula placement.   Hospital Course:  1. Symptomatic Anemia (recent surgery with preceding anemia of chronic kidney disease): Hemoglobin improved status post blood transfusion. Status post intravenous iron, now on ESA  2. Chronic kidney disease stage V with progression to end-stage renal disease-now on maintenance hemodialysis. Dialysis today with possible discharge thereafter. She has been placed to the [facility] on a Tuesday, Thursday and Saturday schedule second shift.  3. Metabolic bone disease: Phosphorus at goal-no indications at this time for phosphorus binder-restarted on vitamin D receptor analog (converted to intravenous from by mouth)  4. Hypertension: Improve with ultrafiltration and resumption of antihypertensive  therapy-continue to monitor closely for further adjustments 5. abd pain: resolved   Procedures: permanent tunneled dialysis catheter   Consultations:  Renal  IR  Discharge Exam: Filed Vitals:   06/18/13 0528  BP: 192/83  Pulse: 97  Temp: 98.8 F (37.1 C)  Resp: 18    General: A+Ox3, NAD Cardiovascular: rrr Respiratory: clear  Discharge Instructions  Discharge Orders   Future Appointments Provider Department Dept Phone   06/20/2013 11:00 AM [facility] [place] [phone]   07/08/2013 12:30 PM [facility] [place] [phone]   07/08/2013 1:45 PM Conrad , MD [facility] [phone]   12/22/2013 10:00 AM [place] [phone]   12/22/2013 10:30 AM Deatra Robinson, MD [facility] [phone]   Future Orders Complete By Expires   Discharge instructions  As directed    Comments:     HD per nephrology Renal diet   Increase activity slowly  As directed        Medication List    STOP taking these medications       calcitRIOL 0.25 MCG capsule  Commonly known as:  ROCALTROL     furosemide 80 MG tablet  Commonly known as:  LASIX     insulin glargine 100 UNIT/ML injection  Commonly known as:  LANTUS      TAKE these medications       acetaminophen 325 MG tablet  Commonly known as:  TYLENOL  Take 650 mg by mouth every 6 (six) hours as needed.     amLODipine  10 MG tablet  Commonly known as:  NORVASC  Take 10 mg by mouth daily.     aspirin 81 MG tablet  Take 81 mg by mouth daily.     cyclobenzaprine 5 MG tablet  Commonly known as:  FLEXERIL  Take 1 tablet (5 mg total) by mouth 2 (two) times daily as needed for muscle spasms.     darbepoetin 200 MCG/0.4ML Soln injection  Commonly known as:  ARANESP  Inject 0.4 mLs (200 mcg total) into the vein every Wednesday with  hemodialysis.  Start taking on:  06/22/2013     doxercalciferol 4 MCG/2ML injection  Commonly known as:  HECTOROL  Inject 0.5 mLs (1 mcg total) into the vein every Monday, Wednesday, and Friday with hemodialysis.     HUMALOG KWIKPEN 100 UNIT/ML Sopn  Generic drug:  insulin lispro  Inject 12-16 Units into the skin 2 (two) times daily. SSI     hydrALAZINE 25 MG tablet  Commonly known as:  APRESOLINE  Take 1 tablet (25 mg total) by mouth 3 (three) times daily.     omeprazole 20 MG capsule  Commonly known as:  PRILOSEC  Take 20 mg by mouth daily.     OVER THE COUNTER MEDICATION  Take 2 capsules by mouth daily. Stool Softner       No Known Allergies     Follow-up Information   Follow up with Cathy Ringer, MD In 1 week.   Specialty:  Internal Medicine   Contact information:   [facility] [place] [id number] [phone]       Please follow up. (HD as arranged- T/TH/Sat)        The results of significant diagnostics from this hospitalization (including imaging, microbiology, ancillary and laboratory) are listed below for reference.    Significant Diagnostic Studies: Dg Chest 2 View  06/15/2013   CLINICAL DATA:  Shortness of breath, weakness, history hypertension, diabetes, renal failure, breast cancer  EXAM: CHEST  2 VIEW  COMPARISON:  06/06/2013  FINDINGS: Enlargement of cardiac silhouette with pulmonary vascular congestion.  Mild perihilar interstitial infiltrates question pulmonary edema.  No segmental consolidation, pleural effusion or pneumothorax.  Bones demineralized with endplate spur formation thoracic spine.  IMPRESSION: Probable CHF.   Electronically Signed   By: Lavonia Dana M.D.   On: 06/15/2013 12:31   Dg Chest 2 View  06/06/2013   CLINICAL DATA:  Preoperative chest x-ray  EXAM: CHEST  2 VIEW  COMPARISON:  Prior chest x-ray and chest CT 10/19/2010  FINDINGS: Stable right subclavian approach single-lumen port a  catheter. The catheter tip projects over the distal SVC. Stable borderline cardiomegaly. Prominence of the bibasilar interstitial markings favored to reflect superimposition of overlying soft tissues. Negative for pulmonary edema, focal airspace consolidation, pleural effusion or pneumothorax. No suspicious pulmonary nodule. No acute osseous abnormality.  IMPRESSION: No active cardiopulmonary disease.  Borderline cardiomegaly.   Electronically Signed   By: Jacqulynn Cadet M.D.   On: 06/06/2013 08:21   Ir Fluoro Guide Cv Line Left  06/17/2013   INDICATION: End-stage renal disease, in need of a permanent access for the continuation of hemodialysis.  EXAM: FLUOROSCOPIC GUIDED CONVERSION OF EXISTING LEFT JUGULAR APPROACH DIALYSIS CATHETER TO A PERMANENT TUNNELED DIALYSIS CATHETER  MEDICATIONS: Ancef 2 g IV; Versed at 1 mg IV; Fentanyl 50 mg IV  CONTRAST:  None  COMPARISON:  Ultrasound fluoroscopic guided temporary dialysis catheter placement - 06/15/2013  FLUOROSCOPY TIME:  1 minute 12 seconds  PROCEDURE: Informed written consent was obtained from the patient after a discussion of the risks, benefits, and alternatives to treatment. Questions regarding the procedure were encouraged and answered. The skin and external portion of the existing temporary hemodialysis catheter was prepped with chlorhexidine in a sterile fashion, and a sterile drape was applied covering the operative field. Maximum barrier sterile technique with sterile gowns and gloves were used for the procedure. A timeout was performed prior to the initiation of the procedure.  A stiff Glidewire was cannulated within the existing temporary dialysis catheter and utilized for measurement purposes. A 23 cm tip to cuff HemoSplit dialysis catheter was tunneled from a location of the left anterior chest to the temporary dialysis catheter access site. Under intermittent fluoroscopic guidance, a stiff Glidewire was advanced through the temporary dialysis  catheter to the level of the IVC.  Under intermittent fluoroscopic guidance, the temporary dialysis catheter was exchanged for a peel-away sheath. The tunneled dialysis catheter was inserted through the peel-away sheath with tips ultimately terminating within the superior aspect of the right atrium. Final catheter positioning was confirmed and documented with a spot radiographic image. The catheter aspirates and flushes normally. The catheter was flushed with appropriate volume heparin dwells.  The catheter exit site was secured with a 0-Prolene retention sutures. Both lumens were heparinized. A dressing was placed. The patient tolerated the procedure well without immediate post procedural complication.  COMPLICATIONS: None immediate  IMPRESSION: Successful fluoroscopic guided conversion of a temporary left jugular approach dialysis catheter to a 23 cm tip to cuff tunneled hemodialysis catheter with tips terminating within the superior aspect of the right atrium. The catheter is ready for immediate use.   Electronically Signed   By: Sandi Mariscal M.D.   On: 06/17/2013 16:52    Microbiology: No results found for this or any previous visit (from the past 240 hour(s)).   Labs: Basic Metabolic Panel:  Recent Labs Lab 06/13/13 1037 06/15/13 1220 06/15/13 1730 06/16/13 0520 06/17/13 0550  NA 141 142  --  138 139  K 4.4 4.0  --  4.0 4.2  CL  --  107  --  103 103  CO2 20* 21  --  23 22  GLUCOSE 124 65*  --  112* 133*  BUN 93.4* 77*  --  54* 61*  CREATININE 9.3* 8.71* 8.79* 6.72* 8.15*  CALCIUM 8.8 8.6  --  8.5 8.9  PHOS  --   --   --  4.4 4.8*   Liver Function Tests:  Recent Labs Lab 06/13/13 1037 06/17/13 0550  AST 21  --   ALT 28  --   ALKPHOS 74  --   BILITOT 0.22  --   PROT 7.0  --   ALBUMIN 2.8* 2.9*    Recent Labs Lab 06/17/13 0550  LIPASE 49   No results found for this basename: AMMONIA,  in the last 168 hours CBC:  Recent Labs Lab 06/13/13 1037 06/15/13 1220  06/15/13 1730 06/16/13 0520 06/17/13 0550  WBC 7.6 7.9 6.3 8.3 9.3  NEUTROABS 5.2 4.5  --   --   --   HGB 6.5* 6.7* 6.3* 9.1* 8.9*  HCT 20.6* 21.3* 19.4* 27.0* 26.0*  MCV 83.4 84.9 82.9 84.4 83.3  PLT 367 421* 404* 366 340   Cardiac Enzymes:  Recent Labs Lab 06/15/13 1220  TROPONINI <0.30   BNP: BNP (last 3 results) No results found for this basename: PROBNP,  in the last 8760 hours CBG:  Recent Labs  Lab 06/17/13 0732 06/17/13 1402 06/17/13 1648 06/17/13 2148 06/18/13 0752  GLUCAP 117* 116* 97 344* 147*       Signed:  An Schnabel  [facility] 06/18/2013, 11:30 AM

## 2013-06-18 NOTE — Progress Notes (Signed)
Discharge instructions given. Verbalizes understanding. No complaints at this time.

## 2013-06-20 ENCOUNTER — Encounter (HOSPITAL_COMMUNITY): Payer: Medicare Other

## 2013-06-21 DIAGNOSIS — N2581 Secondary hyperparathyroidism of renal origin: Secondary | ICD-10-CM | POA: Diagnosis not present

## 2013-06-21 DIAGNOSIS — D631 Anemia in chronic kidney disease: Secondary | ICD-10-CM | POA: Diagnosis not present

## 2013-06-21 DIAGNOSIS — N186 End stage renal disease: Secondary | ICD-10-CM | POA: Diagnosis not present

## 2013-06-23 ENCOUNTER — Other Ambulatory Visit (HOSPITAL_COMMUNITY): Payer: Self-pay | Admitting: *Deleted

## 2013-06-24 ENCOUNTER — Inpatient Hospital Stay (HOSPITAL_COMMUNITY): Admission: RE | Admit: 2013-06-24 | Payer: Medicare Other | Source: Ambulatory Visit

## 2013-07-06 ENCOUNTER — Other Ambulatory Visit: Payer: Self-pay | Admitting: *Deleted

## 2013-07-06 DIAGNOSIS — Z4931 Encounter for adequacy testing for hemodialysis: Secondary | ICD-10-CM

## 2013-07-06 DIAGNOSIS — N186 End stage renal disease: Secondary | ICD-10-CM

## 2013-07-07 ENCOUNTER — Encounter: Payer: Self-pay | Admitting: Vascular Surgery

## 2013-07-08 ENCOUNTER — Ambulatory Visit (HOSPITAL_COMMUNITY)
Admit: 2013-07-08 | Discharge: 2013-07-08 | Disposition: A | Discharge status: Home / Self Care | Payer: Medicare Other | Attending: Vascular Surgery | Admitting: Vascular Surgery

## 2013-07-08 ENCOUNTER — Ambulatory Visit (INDEPENDENT_AMBULATORY_CARE_PROVIDER_SITE_OTHER): Payer: Medicare Other | Admitting: Vascular Surgery

## 2013-07-08 ENCOUNTER — Encounter: Payer: Self-pay | Admitting: Vascular Surgery

## 2013-07-08 VITALS — BP 148/81 | HR 108 | Ht 63.0 in | Wt 207.0 lb

## 2013-07-08 DIAGNOSIS — N186 End stage renal disease: Secondary | ICD-10-CM

## 2013-07-08 DIAGNOSIS — Z4931 Encounter for adequacy testing for hemodialysis: Secondary | ICD-10-CM | POA: Diagnosis not present

## 2013-07-08 DIAGNOSIS — T82898A Other specified complication of vascular prosthetic devices, implants and grafts, initial encounter: Secondary | ICD-10-CM | POA: Insufficient documentation

## 2013-07-08 DIAGNOSIS — Y832 Surgical operation with anastomosis, bypass or graft as the cause of abnormal reaction of the patient, or of later complication, without mention of misadventure at the time of the procedure: Secondary | ICD-10-CM | POA: Insufficient documentation

## 2013-07-08 NOTE — Progress Notes (Signed)
[facility]  Postoperative Access Visit  History of Present Illness  [name] is a 62 y.o. year old female who presents for postoperative follow-up for: R BC AVF, Removal R mediport (Date: 06/06/13).  The patient's wounds are healed.  The patient notes no steal symptoms.  The patient is able to complete their activities of daily living.  The patient's current symptoms are: occasional forearm pain.  For VQI Use Only  PRE-ADM LIVING: Home  AMB STATUS: Ambulatory  Physical Examination Filed Vitals:   07/08/13 1316  BP: 148/81  Pulse: 108    RUE: Incision is healed, skin feels warm, hand grip is 5/5, sensation in digits is intact, strongly palpable thrill, bruit can be auscultated   Chest: inc healed  R arm access duplex (07/08/2013)  Diameter: 5.9-9.0 mm except for tapered distal fistula down to 3.2 mm  Elevated PSV felt due to tortuosity   Medical Decision Making  [name] is a 62 y.o. year old female who presents s/p R BC AVF, R mediport excision.  The patient's access is ready for use.  I prefer to have the distal fistula taper to limit the risk of steal, so at this point, I would not intervene on the perianastomotic segment.  The patient's tunneled dialysis catheter can be removed after two successful cannulations and completed dialysis treatments..  Thank you for allowing [place] to participate in this patient's care.  Adele Barthel, MD [facility] Office: [phone] Pager: [phone]  07/08/2013, 1:34 PM

## 2013-07-12 ENCOUNTER — Encounter: Payer: Self-pay | Admitting: *Deleted

## 2013-07-20 DIAGNOSIS — N186 End stage renal disease: Secondary | ICD-10-CM | POA: Diagnosis not present

## 2013-07-23 DIAGNOSIS — D631 Anemia in chronic kidney disease: Secondary | ICD-10-CM | POA: Diagnosis not present

## 2013-07-23 DIAGNOSIS — N2581 Secondary hyperparathyroidism of renal origin: Secondary | ICD-10-CM | POA: Diagnosis not present

## 2013-07-23 DIAGNOSIS — N186 End stage renal disease: Secondary | ICD-10-CM | POA: Diagnosis not present

## 2013-08-08 DIAGNOSIS — I10 Essential (primary) hypertension: Secondary | ICD-10-CM | POA: Diagnosis not present

## 2013-08-08 DIAGNOSIS — E1129 Type 2 diabetes mellitus with other diabetic kidney complication: Secondary | ICD-10-CM | POA: Diagnosis not present

## 2013-08-08 DIAGNOSIS — R634 Abnormal weight loss: Secondary | ICD-10-CM | POA: Diagnosis not present

## 2013-08-08 DIAGNOSIS — Z6833 Body mass index (BMI) 33.0-33.9, adult: Secondary | ICD-10-CM | POA: Diagnosis not present

## 2013-08-08 DIAGNOSIS — C50919 Malignant neoplasm of unspecified site of unspecified female breast: Secondary | ICD-10-CM | POA: Diagnosis not present

## 2013-08-08 DIAGNOSIS — D509 Iron deficiency anemia, unspecified: Secondary | ICD-10-CM | POA: Diagnosis not present

## 2013-08-08 DIAGNOSIS — I509 Heart failure, unspecified: Secondary | ICD-10-CM | POA: Diagnosis not present

## 2013-08-08 DIAGNOSIS — N186 End stage renal disease: Secondary | ICD-10-CM | POA: Diagnosis not present

## 2013-08-18 DIAGNOSIS — N186 End stage renal disease: Secondary | ICD-10-CM | POA: Diagnosis not present

## 2013-08-20 DIAGNOSIS — N186 End stage renal disease: Secondary | ICD-10-CM | POA: Diagnosis not present

## 2013-08-23 DIAGNOSIS — N2581 Secondary hyperparathyroidism of renal origin: Secondary | ICD-10-CM | POA: Diagnosis not present

## 2013-08-23 DIAGNOSIS — D631 Anemia in chronic kidney disease: Secondary | ICD-10-CM | POA: Diagnosis not present

## 2013-08-23 DIAGNOSIS — N186 End stage renal disease: Secondary | ICD-10-CM | POA: Diagnosis not present

## 2013-09-05 DIAGNOSIS — H4089 Other specified glaucoma: Secondary | ICD-10-CM | POA: Diagnosis not present

## 2013-09-05 DIAGNOSIS — E1139 Type 2 diabetes mellitus with other diabetic ophthalmic complication: Secondary | ICD-10-CM | POA: Diagnosis not present

## 2013-09-05 DIAGNOSIS — H2100 Hyphema, unspecified eye: Secondary | ICD-10-CM | POA: Diagnosis not present

## 2013-09-05 DIAGNOSIS — E11359 Type 2 diabetes mellitus with proliferative diabetic retinopathy without macular edema: Secondary | ICD-10-CM | POA: Diagnosis not present

## 2013-09-06 DIAGNOSIS — E11311 Type 2 diabetes mellitus with unspecified diabetic retinopathy with macular edema: Secondary | ICD-10-CM | POA: Diagnosis not present

## 2013-09-06 DIAGNOSIS — E1139 Type 2 diabetes mellitus with other diabetic ophthalmic complication: Secondary | ICD-10-CM | POA: Diagnosis not present

## 2013-09-06 DIAGNOSIS — E11359 Type 2 diabetes mellitus with proliferative diabetic retinopathy without macular edema: Secondary | ICD-10-CM | POA: Diagnosis not present

## 2013-09-06 DIAGNOSIS — H4089 Other specified glaucoma: Secondary | ICD-10-CM | POA: Diagnosis not present

## 2013-09-06 DIAGNOSIS — H4310 Vitreous hemorrhage, unspecified eye: Secondary | ICD-10-CM | POA: Diagnosis not present

## 2013-09-07 DIAGNOSIS — H4310 Vitreous hemorrhage, unspecified eye: Secondary | ICD-10-CM | POA: Diagnosis not present

## 2013-09-07 DIAGNOSIS — H4050X0 Glaucoma secondary to other eye disorders, unspecified eye, stage unspecified: Secondary | ICD-10-CM | POA: Diagnosis not present

## 2013-09-07 DIAGNOSIS — H3320 Serous retinal detachment, unspecified eye: Secondary | ICD-10-CM | POA: Diagnosis not present

## 2013-09-07 DIAGNOSIS — H2100 Hyphema, unspecified eye: Secondary | ICD-10-CM | POA: Diagnosis not present

## 2013-09-07 DIAGNOSIS — H4089 Other specified glaucoma: Secondary | ICD-10-CM | POA: Diagnosis not present

## 2013-09-07 DIAGNOSIS — H3560 Retinal hemorrhage, unspecified eye: Secondary | ICD-10-CM | POA: Diagnosis not present

## 2013-09-16 DIAGNOSIS — H4089 Other specified glaucoma: Secondary | ICD-10-CM | POA: Diagnosis not present

## 2013-09-16 DIAGNOSIS — H3560 Retinal hemorrhage, unspecified eye: Secondary | ICD-10-CM | POA: Diagnosis not present

## 2013-09-17 DIAGNOSIS — N186 End stage renal disease: Secondary | ICD-10-CM | POA: Diagnosis not present

## 2013-09-19 DIAGNOSIS — H5710 Ocular pain, unspecified eye: Secondary | ICD-10-CM | POA: Diagnosis not present

## 2013-09-19 DIAGNOSIS — H01029 Squamous blepharitis unspecified eye, unspecified eyelid: Secondary | ICD-10-CM | POA: Diagnosis not present

## 2013-09-19 DIAGNOSIS — H11439 Conjunctival hyperemia, unspecified eye: Secondary | ICD-10-CM | POA: Diagnosis not present

## 2013-09-19 DIAGNOSIS — H35039 Hypertensive retinopathy, unspecified eye: Secondary | ICD-10-CM | POA: Diagnosis not present

## 2013-09-20 DIAGNOSIS — N186 End stage renal disease: Secondary | ICD-10-CM | POA: Diagnosis not present

## 2013-09-20 DIAGNOSIS — N039 Chronic nephritic syndrome with unspecified morphologic changes: Secondary | ICD-10-CM | POA: Diagnosis not present

## 2013-09-20 DIAGNOSIS — D631 Anemia in chronic kidney disease: Secondary | ICD-10-CM | POA: Diagnosis not present

## 2013-09-20 DIAGNOSIS — N2581 Secondary hyperparathyroidism of renal origin: Secondary | ICD-10-CM | POA: Diagnosis not present

## 2013-09-23 ENCOUNTER — Other Ambulatory Visit: Payer: Self-pay | Admitting: Thoracic Diseases

## 2013-09-23 DIAGNOSIS — Z6832 Body mass index (BMI) 32.0-32.9, adult: Secondary | ICD-10-CM | POA: Diagnosis not present

## 2013-09-23 DIAGNOSIS — I10 Essential (primary) hypertension: Secondary | ICD-10-CM | POA: Diagnosis not present

## 2013-09-23 DIAGNOSIS — H3500 Unspecified background retinopathy: Secondary | ICD-10-CM | POA: Diagnosis not present

## 2013-09-23 DIAGNOSIS — N186 End stage renal disease: Secondary | ICD-10-CM | POA: Diagnosis not present

## 2013-09-23 DIAGNOSIS — I509 Heart failure, unspecified: Secondary | ICD-10-CM | POA: Diagnosis not present

## 2013-09-23 DIAGNOSIS — E1129 Type 2 diabetes mellitus with other diabetic kidney complication: Secondary | ICD-10-CM | POA: Diagnosis not present

## 2013-09-23 DIAGNOSIS — J019 Acute sinusitis, unspecified: Secondary | ICD-10-CM | POA: Diagnosis not present

## 2013-09-23 DIAGNOSIS — E46 Unspecified protein-calorie malnutrition: Secondary | ICD-10-CM | POA: Insufficient documentation

## 2013-10-01 ENCOUNTER — Encounter: Payer: Self-pay | Admitting: *Deleted

## 2013-10-04 ENCOUNTER — Telehealth: Payer: Self-pay | Admitting: Interventional Cardiology

## 2013-10-04 NOTE — Telephone Encounter (Signed)
New problem   Need to know status of medical clearance form for pt to have sx. Please call

## 2013-10-10 ENCOUNTER — Telehealth: Payer: Self-pay | Admitting: Interventional Cardiology

## 2013-10-10 NOTE — Telephone Encounter (Signed)
This is not a patient of mine,

## 2013-10-10 NOTE — Telephone Encounter (Signed)
New message         Pt sister is inquiring about a date for surgery. Please give her call.

## 2013-10-10 NOTE — Telephone Encounter (Signed)
Sister calling stating Ms. Troy needs to have eye surgery but needs clearance per Dr. Brigitte Pulse. Surgery has not been scheduled yet.  States she left a form last week and wanted to know if has been completed.  States pt will have to have an EKG and clearance note.  Advised Dr. Tamala Julian and his nurse Stephani Police is not in office today but will forward message to them.

## 2013-10-11 NOTE — Telephone Encounter (Signed)
Patient needs cardiac clearance for upcoming eye surgery.routed to [place]

## 2013-10-12 ENCOUNTER — Ambulatory Visit (INDEPENDENT_AMBULATORY_CARE_PROVIDER_SITE_OTHER): Payer: Medicare Other | Admitting: Cardiovascular Disease

## 2013-10-12 ENCOUNTER — Encounter: Payer: Self-pay | Admitting: Cardiovascular Disease

## 2013-10-12 VITALS — BP 182/56 | HR 100 | Ht 63.0 in | Wt 187.8 lb

## 2013-10-12 DIAGNOSIS — I509 Heart failure, unspecified: Secondary | ICD-10-CM | POA: Diagnosis not present

## 2013-10-12 MED ORDER — CARVEDILOL 6.25 MG PO TABS: 6.2500 mg | ORAL_TABLET | Freq: Two times a day (BID) | ORAL | Status: DC

## 2013-10-12 NOTE — Progress Notes (Addendum)
[name] Date of Birth  07/13/1951       [place] [address], [place], [place]  [address]   [place], [place]  [address] [place]   Fax  [phone]     Fax [phone]  Problem List: 1. End-stage renal disease- currently on hemodialysis 2. Hypertension 3.  diabetes mellitus 4. History of left breast cancer 5. Hyperlipidemia  History of Present Illness:    Ms. Schnurbusch is a 63 yo who I saw 10 years ago for a hospital admission to [facility].  She's been having some chest pain. Cardiac catheterization in 2005 revealed fairly smooth and normal coronary arteries.  She was scheduled to see me today for pre-op visit prior to eye enucleation.  I saw her in 2005 and did a cath - which revealed normal coronaries.   She was seen 2012by Dr. Rayann Heman. She had  been having some chest discomfort at that time. A stress Myoview study was normal during that admission 10/20/10.  She had no ischemia and her LV EF was normal at 66%.   she saw Dr. Daneen Schick in 2014 ( records in [facility] system)   She has not had any chest pains.    She denies any chest pain or dyspnea.   She has not had any cardiac issues.   Her biggest problem is her persistent HTN.  She has had nausea with poor appetite and has lost weight recently.    Current Outpatient Prescriptions on File Prior to Visit  Medication Sig Dispense Refill  . acetaminophen (TYLENOL) 325 MG tablet Take 650 mg by mouth every 6 (six) hours as needed.      Marland Kitchen amLODipine (NORVASC) 10 MG tablet Take 10 mg by mouth daily.      Marland Kitchen aspirin 81 MG tablet Take 81 mg by mouth daily.        . cyclobenzaprine (FLEXERIL) 5 MG tablet Take 1 tablet (5 mg total) by mouth 2 (two) times daily as needed for muscle spasms.  10 tablet  0  . darbepoetin (ARANESP) 200 MCG/0.4ML SOLN injection Inject 0.4 mLs (200 mcg total) into the vein every Wednesday with hemodialysis.   1.68 mL    . doxercalciferol (HECTOROL) 4 MCG/2ML injection Inject 0.5 mLs (1 mcg total) into the vein every Monday, Wednesday, and Friday with hemodialysis.  2 mL    . hydrALAZINE (APRESOLINE) 25 MG tablet Take 1 tablet (25 mg total) by mouth 3 (three) times daily.  90 tablet  0  . insulin lispro (HUMALOG KWIKPEN) 100 UNIT/ML SOPN Inject 12-16 Units into the skin 2 (two) times daily. SSI      . omeprazole (PRILOSEC) 20 MG capsule Take 20 mg by mouth daily.      Marland Kitchen OVER THE COUNTER MEDICATION Take 2 capsules by mouth daily. Stool Softner       No current facility-administered medications on file prior to visit.    No Known Allergies  Past Medical History  Diagnosis Date  . Hypertension   . GERD (gastroesophageal reflux disease)   . Anemia   . Myalgia 12/31/2011  . Neuropathy 12/31/2011  . Shortness of breath     "lately it's been all the time" (06/15/2013)  . Type II diabetes mellitus   . Arthritis     "joints" (06/15/2013)  . Breast cancer     left  . Anxiety   .  ESRD (end stage renal disease)     "suppose to start dialysis today" (06/15/2013)    Past Surgical History  Procedure Laterality Date  . Cesarean section  1980  . Tonsillectomy    . Av fistula placement Right 06/06/2013    Procedure: ARTERIOVENOUS (AV) FISTULA CREATION-RIGHT BRACHIAL CEPHALIC;  Surgeon: Conrad Harper Woods, MD;  Location: [facility];  Service: Vascular;  Laterality: Right;  . Removal of a dialysis catheter Right 06/06/2013    Procedure: REMOVAL OF RIGHT MEDIPORT;  Surgeon: Conrad Greenfield, MD;  Location: [facility];  Service: Vascular;  Laterality: Right;  . Cataract extraction w/ anterior vitrectomy Bilateral   . Refractive surgery Bilateral   . Eye surgery Bilateral     laser surgery  . Abdominal hysterectomy      partial  . Breast biopsy Left   . Breast lumpectomy Left     "and took out some lymph nodes" (06/15/2013)    History  Smoking status  . Never Smoker   Smokeless tobacco  . Never Used    History   Alcohol Use No    Family History  Problem Relation Age of Onset  . Diabetes Mother   . Hyperlipidemia Mother   . Hypertension Mother   . Hypertension Father   . Diabetes Sister   . Diabetes Brother   . Hypertension Brother   . Heart attack Brother     Reviw of Systems:  Reviewed in the HPI.  All other systems are negative.  Physical Exam: Blood pressure 182/56, pulse 100, height 5\' 3"  (1.6 m), weight 187 lb 12.8 oz (85.186 kg). Wt Readings from Last 3 Encounters:  10/12/13 187 lb 12.8 oz (85.186 kg)  07/08/13 207 lb (93.895 kg)  06/18/13 213 lb 3 oz (96.7 kg)     General: Well developed, well nourished, in no acute distress.  Head: Normocephalic, atraumatic, sclera non-icteric, mucus membranes are moist,   Neck: Supple. Carotids are 2 + without bruits. No JVD   Lungs: Clear   Heart: RR, normla S1S2  Abdomen: Soft, non-tender, non-distended with normal bowel sounds.  Msk:  Strength and tone are normal   Extremities: No clubbing or cyanosis. No edema.  Distal pedal pulses are 2+ and equal    Neuro: CN II - XII intact.  Alert and oriented X 3.   Psych:  Normal   ECG: Nov. 26, 2014:  Sinus tach.  Otherwise normal.   Assessment / Plan:

## 2013-10-12 NOTE — Assessment & Plan Note (Addendum)
[name] carries  the diagnosis of congestive heart failure. She has had normal left ventricular systolic function both a cardiac catheterization in 2005 and during a stress Myoview study in 2012.   Her LV EF ws 66% at that time.  She is hypertensive and tachycardic.  She has no symptoms of congestive heart failure at all. She limits her salt in her water intake. He. She has had some nausea and thinks that she may have lost some weight.  We will add carvedilol 6.25 mg twice a day to medical regimen.  She is at low risk for her upcoming eye surgery.   I suspect that she needs to have more fluid removed during her dialysis sessions. She sees the dialysis team 3 times a week. My  hope is that the nephrology team  will be able to manage and titrate her antihypertensives .   I have asked her to discuss this with Dr. Mercy Moore.   I'll see her in 3 months for a followup visit.    If her cardiac issues are stable, we will see her on an as-needed basis at that time.

## 2013-10-12 NOTE — Telephone Encounter (Signed)
The patient is cleared for the upcoming surgery.

## 2013-10-12 NOTE — Patient Instructions (Signed)
Your physician has recommended you make the following change in your medication:  START CARVEDILOL 6.25 MG TWICE DAILY 12 HOURS APART.  BLOOD PRESSURE/ HEART RATE MEDICATION  Your physician recommends that you schedule a follow-up appointment in: 3 MONTHS

## 2013-10-14 ENCOUNTER — Telehealth: Payer: Self-pay | Admitting: Cardiovascular Disease

## 2013-10-14 NOTE — Telephone Encounter (Signed)
Received request from Nurse fax box, documents faxed for surgical clearance. To: [facility] Fax number: [phone] Attention: [id number]/kdm

## 2013-10-14 NOTE — Telephone Encounter (Signed)
Notified sister that she has been cleared for eye surgery.  She actually was seen by Dr. Acie Fredrickson and he cleared her and papers were sent yesterday by Guerry Minors

## 2013-10-18 DIAGNOSIS — D509 Iron deficiency anemia, unspecified: Secondary | ICD-10-CM | POA: Insufficient documentation

## 2013-10-18 DIAGNOSIS — N186 End stage renal disease: Secondary | ICD-10-CM | POA: Diagnosis not present

## 2013-10-20 DIAGNOSIS — N186 End stage renal disease: Secondary | ICD-10-CM | POA: Diagnosis not present

## 2013-10-20 DIAGNOSIS — N2581 Secondary hyperparathyroidism of renal origin: Secondary | ICD-10-CM | POA: Diagnosis not present

## 2013-10-20 DIAGNOSIS — D509 Iron deficiency anemia, unspecified: Secondary | ICD-10-CM | POA: Diagnosis not present

## 2013-10-20 DIAGNOSIS — D631 Anemia in chronic kidney disease: Secondary | ICD-10-CM | POA: Diagnosis not present

## 2013-10-25 DIAGNOSIS — D649 Anemia, unspecified: Secondary | ICD-10-CM | POA: Diagnosis not present

## 2013-10-25 DIAGNOSIS — H11139 Conjunctival pigmentations, unspecified eye: Secondary | ICD-10-CM | POA: Diagnosis not present

## 2013-10-25 DIAGNOSIS — H11439 Conjunctival hyperemia, unspecified eye: Secondary | ICD-10-CM | POA: Diagnosis not present

## 2013-10-25 DIAGNOSIS — I120 Hypertensive chronic kidney disease with stage 5 chronic kidney disease or end stage renal disease: Secondary | ICD-10-CM | POA: Diagnosis not present

## 2013-10-25 DIAGNOSIS — H16219 Exposure keratoconjunctivitis, unspecified eye: Secondary | ICD-10-CM | POA: Diagnosis not present

## 2013-10-25 DIAGNOSIS — H3500 Unspecified background retinopathy: Secondary | ICD-10-CM | POA: Diagnosis not present

## 2013-10-25 DIAGNOSIS — H35039 Hypertensive retinopathy, unspecified eye: Secondary | ICD-10-CM | POA: Diagnosis not present

## 2013-10-25 DIAGNOSIS — K219 Gastro-esophageal reflux disease without esophagitis: Secondary | ICD-10-CM | POA: Diagnosis not present

## 2013-10-25 DIAGNOSIS — N186 End stage renal disease: Secondary | ICD-10-CM | POA: Diagnosis not present

## 2013-10-25 DIAGNOSIS — H119 Unspecified disorder of conjunctiva: Secondary | ICD-10-CM | POA: Diagnosis not present

## 2013-10-25 DIAGNOSIS — Z992 Dependence on renal dialysis: Secondary | ICD-10-CM | POA: Diagnosis not present

## 2013-10-25 DIAGNOSIS — I509 Heart failure, unspecified: Secondary | ICD-10-CM | POA: Diagnosis not present

## 2013-10-25 DIAGNOSIS — H5440 Blindness, one eye, unspecified eye: Secondary | ICD-10-CM | POA: Diagnosis not present

## 2013-10-25 DIAGNOSIS — H5710 Ocular pain, unspecified eye: Secondary | ICD-10-CM | POA: Diagnosis not present

## 2013-11-17 DIAGNOSIS — N186 End stage renal disease: Secondary | ICD-10-CM | POA: Diagnosis not present

## 2013-11-19 DIAGNOSIS — N186 End stage renal disease: Secondary | ICD-10-CM | POA: Diagnosis not present

## 2013-11-19 DIAGNOSIS — D509 Iron deficiency anemia, unspecified: Secondary | ICD-10-CM | POA: Diagnosis not present

## 2013-11-19 DIAGNOSIS — N2581 Secondary hyperparathyroidism of renal origin: Secondary | ICD-10-CM | POA: Diagnosis not present

## 2013-11-19 DIAGNOSIS — D631 Anemia in chronic kidney disease: Secondary | ICD-10-CM | POA: Diagnosis not present

## 2013-12-06 ENCOUNTER — Telehealth: Payer: Self-pay | Admitting: Adult Health

## 2013-12-06 NOTE — Telephone Encounter (Signed)
, °

## 2013-12-18 DIAGNOSIS — N186 End stage renal disease: Secondary | ICD-10-CM | POA: Diagnosis not present

## 2013-12-20 DIAGNOSIS — N186 End stage renal disease: Secondary | ICD-10-CM | POA: Diagnosis not present

## 2013-12-20 DIAGNOSIS — Z23 Encounter for immunization: Secondary | ICD-10-CM | POA: Diagnosis not present

## 2013-12-20 DIAGNOSIS — N2581 Secondary hyperparathyroidism of renal origin: Secondary | ICD-10-CM | POA: Diagnosis not present

## 2013-12-20 DIAGNOSIS — D631 Anemia in chronic kidney disease: Secondary | ICD-10-CM | POA: Diagnosis not present

## 2013-12-20 DIAGNOSIS — D509 Iron deficiency anemia, unspecified: Secondary | ICD-10-CM | POA: Diagnosis not present

## 2013-12-22 ENCOUNTER — Ambulatory Visit: Payer: Medicare Other | Admitting: Oncology

## 2013-12-22 ENCOUNTER — Other Ambulatory Visit: Payer: Medicare Other

## 2013-12-28 ENCOUNTER — Ambulatory Visit (INDEPENDENT_AMBULATORY_CARE_PROVIDER_SITE_OTHER): Payer: Medicare Other | Admitting: Cardiovascular Disease

## 2013-12-28 ENCOUNTER — Encounter: Payer: Self-pay | Admitting: Cardiovascular Disease

## 2013-12-28 VITALS — BP 130/86 | HR 71 | Ht 63.0 in | Wt 182.6 lb

## 2013-12-28 DIAGNOSIS — I509 Heart failure, unspecified: Secondary | ICD-10-CM | POA: Diagnosis not present

## 2013-12-28 NOTE — Progress Notes (Signed)
[name] Date of Birth  October 24, 1950       [place] [address], [address], [place], [place]  [address]   [place], [place]  [address] [place]   Fax  [phone]     Fax [phone]  Problem List: 1. End-stage renal disease- currently on hemodialysis 2. Hypertension 3.  diabetes mellitus 4. History of left breast cancer 5. Hyperlipidemia  History of Present Illness:    Ms. [name] is a 63 yo who I saw 10 years ago for a hospital admission to [facility].  She's been having some chest pain. Cardiac catheterization in 2005 revealed fairly smooth and normal coronary arteries.  She was scheduled to see me today for pre-op visit prior to eye enucleation.  I saw her in 2005 and did a cath - which revealed normal coronaries.   She was seen 2012by Dr. Rayann Kane. She had  been having some chest discomfort at that time. A stress Myoview study was normal during that admission 10/20/10.  She had no ischemia and her LV EF was normal at 66%.   she saw Dr. Daneen Kane in 2014 ( records in [facility] system)   She has not had any chest pains.    She denies any chest pain or dyspnea.   She has not had any cardiac issues.   Her biggest problem is her persistent HTN.  She has had nausea with poor appetite and has lost weight recently.  December 28, 2013:   [name] is seen today .  Bp has been stable.   She has had her left eye removed since I last saw her.  She did well with her surgery.   She has occasional episodes of shortness breath especially if she drinks too much fluid.    Current Outpatient Prescriptions on File Prior to Visit  Medication Sig Dispense Refill  . acetaminophen (TYLENOL) 325 MG tablet Take 650 mg by mouth every 6 (six) hours as needed.      Marland Kitchen amLODipine (NORVASC) 10 MG tablet Take 10 mg by mouth daily.      Marland Kitchen aspirin 81 MG tablet Take 81 mg by mouth daily.        . carvedilol (COREG) 6.25 MG  tablet Take 1 tablet (6.25 mg total) by mouth 2 (two) times daily.  60 tablet  11  . cyclobenzaprine (FLEXERIL) 5 MG tablet Take 1 tablet (5 mg total) by mouth 2 (two) times daily as needed for muscle spasms.  10 tablet  0  . darbepoetin (ARANESP) 200 MCG/0.4ML SOLN injection Inject 0.4 mLs (200 mcg total) into the vein every Wednesday with hemodialysis.  1.68 mL    . doxercalciferol (HECTOROL) 4 MCG/2ML injection Inject 0.5 mLs (1 mcg total) into the vein every Monday, Wednesday, and Friday with hemodialysis.  2 mL    . insulin lispro (HUMALOG KWIKPEN) 100 UNIT/ML SOPN Inject 12-16 Units into the skin 2 (two) times daily. SSI      . omeprazole (PRILOSEC) 20 MG capsule Take 20 mg by mouth daily.      . ondansetron (ZOFRAN) 4 MG tablet Take 4 mg by mouth every 8 (eight) hours as needed for nausea or vomiting.      Marland Kitchen OVER THE COUNTER MEDICATION Take 2 capsules by mouth daily. Stool Softner      . traMADol (ULTRAM) 50 MG tablet Take by mouth every 6 (six)  hours as needed.       No current facility-administered medications on file prior to visit.    No Known Allergies  Past Medical History  Diagnosis Date  . Hypertension   . GERD (gastroesophageal reflux disease)   . Anemia   . Myalgia 12/31/2011  . Neuropathy 12/31/2011  . Shortness of breath     "lately it's been all the time" (06/15/2013)  . Type II diabetes mellitus   . Arthritis     "joints" (06/15/2013)  . Breast cancer     left  . Anxiety   . ESRD (end stage renal disease)     "suppose to start dialysis today" (06/15/2013)    Past Surgical History  Procedure Laterality Date  . Cesarean section  1980  . Tonsillectomy    . Av fistula placement Right 06/06/2013    Procedure: ARTERIOVENOUS (AV) FISTULA CREATION-RIGHT BRACHIAL CEPHALIC;  Surgeon: Cathy Dunnell, MD;  Location: [facility];  Service: Vascular;  Laterality: Right;  . Removal of a dialysis catheter Right 06/06/2013    Procedure: REMOVAL OF RIGHT MEDIPORT;  Surgeon: Cathy , MD;  Location: [facility];  Service: Vascular;  Laterality: Right;  . Cataract extraction w/ anterior vitrectomy Bilateral   . Refractive surgery Bilateral   . Eye surgery Bilateral     laser surgery  . Abdominal hysterectomy      partial  . Breast biopsy Left   . Breast lumpectomy Left     "and took out some lymph nodes" (06/15/2013)    History  Smoking status  . Never Smoker   Smokeless tobacco  . Never Used    History  Alcohol Use No    Family History  Problem Relation Age of Onset  . Diabetes Mother   . Hyperlipidemia Mother   . Hypertension Mother   . Hypertension Father   . Diabetes Sister   . Diabetes Brother   . Hypertension Brother   . Heart attack Brother     Reviw of Systems:  Reviewed in the HPI.  All other systems are negative.  Physical Exam: Blood pressure 130/86, pulse 71, height 5\' 3"  (1.6 m), weight 182 lb 9.6 oz (82.827 kg). Wt Readings from Last 3 Encounters:  12/28/13 182 lb 9.6 oz (82.827 kg)  10/12/13 187 lb 12.8 oz (85.186 kg)  07/08/13 207 lb (93.895 kg)     General: Well developed, well nourished, in no acute distress.  Head: Normocephalic, atraumatic, sclera non-icteric, mucus membranes are moist,   Neck: Supple. Carotids are 2 + without bruits. No JVD   Lungs: Clear   Heart: RR, normla S1S2  Abdomen: Soft, non-tender, non-distended with normal bowel sounds.  Msk:  Strength and tone are normal   Extremities: No clubbing or cyanosis. No edema.  Distal pedal pulses are 2+ and equal    Neuro: CN II - XII intact.  Alert and oriented X 3.   Psych:  Normal   ECG: Nov. 26, 2014:  Sinus tach.  Otherwise normal.   Assessment / Plan:

## 2013-12-28 NOTE — Patient Instructions (Signed)
Your physician recommends that you schedule a follow-up appointment AS NEEDED   Your physician recommends that you continue on your current medications as directed. Please refer to the Current Medication list given to you today.

## 2013-12-28 NOTE — Assessment & Plan Note (Signed)
[name] is doing well .  No significant dyspnea.  Will have her nephrologist manage her BP and volume status.  I will see her on an as needed basis.

## 2014-01-03 DIAGNOSIS — Z23 Encounter for immunization: Secondary | ICD-10-CM | POA: Insufficient documentation

## 2014-01-08 ENCOUNTER — Ambulatory Visit (INDEPENDENT_AMBULATORY_CARE_PROVIDER_SITE_OTHER): Payer: Medicare Other

## 2014-01-08 ENCOUNTER — Ambulatory Visit (INDEPENDENT_AMBULATORY_CARE_PROVIDER_SITE_OTHER): Payer: Medicare Other | Admitting: Family Medicine

## 2014-01-08 VITALS — BP 140/80 | HR 84 | Temp 98.0°F | Resp 17 | Ht 64.0 in | Wt 184.0 lb

## 2014-01-08 DIAGNOSIS — M25511 Pain in right shoulder: Secondary | ICD-10-CM

## 2014-01-08 DIAGNOSIS — R079 Chest pain, unspecified: Secondary | ICD-10-CM

## 2014-01-08 DIAGNOSIS — M25519 Pain in unspecified shoulder: Secondary | ICD-10-CM

## 2014-01-08 DIAGNOSIS — M25512 Pain in left shoulder: Secondary | ICD-10-CM

## 2014-01-08 DIAGNOSIS — M542 Cervicalgia: Secondary | ICD-10-CM

## 2014-01-08 DIAGNOSIS — T148XXA Other injury of unspecified body region, initial encounter: Secondary | ICD-10-CM

## 2014-01-08 MED ORDER — CYCLOBENZAPRINE HCL 5 MG PO TABS: 5.0000 mg | ORAL_TABLET | Freq: Two times a day (BID) | ORAL | Status: DC | PRN
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
## 2014-01-08 NOTE — Patient Instructions (Signed)

## 2014-01-10 ENCOUNTER — Telehealth: Payer: Self-pay | Admitting: Adult Health

## 2014-01-10 NOTE — Telephone Encounter (Signed)
CALLED PT TO ADVISE APPT 7/1 HAS BEEN MOVED PER LC REQ. GAVE PT NEW APPT FOR 02/23/14 AND MAILED APPT CALENDAR.

## 2014-01-17 DIAGNOSIS — N186 End stage renal disease: Secondary | ICD-10-CM | POA: Diagnosis not present

## 2014-01-18 ENCOUNTER — Other Ambulatory Visit: Payer: Medicare Other

## 2014-01-18 ENCOUNTER — Ambulatory Visit: Payer: Medicare Other | Admitting: Adult Health

## 2014-01-19 DIAGNOSIS — N2581 Secondary hyperparathyroidism of renal origin: Secondary | ICD-10-CM | POA: Diagnosis not present

## 2014-01-19 DIAGNOSIS — D509 Iron deficiency anemia, unspecified: Secondary | ICD-10-CM | POA: Diagnosis not present

## 2014-01-19 DIAGNOSIS — D631 Anemia in chronic kidney disease: Secondary | ICD-10-CM | POA: Diagnosis not present

## 2014-01-19 DIAGNOSIS — N186 End stage renal disease: Secondary | ICD-10-CM | POA: Diagnosis not present

## 2014-02-14 DIAGNOSIS — R300 Dysuria: Secondary | ICD-10-CM | POA: Insufficient documentation

## 2014-02-17 DIAGNOSIS — N186 End stage renal disease: Secondary | ICD-10-CM | POA: Diagnosis not present

## 2014-02-18 DIAGNOSIS — N2581 Secondary hyperparathyroidism of renal origin: Secondary | ICD-10-CM | POA: Diagnosis not present

## 2014-02-18 DIAGNOSIS — N186 End stage renal disease: Secondary | ICD-10-CM | POA: Diagnosis not present

## 2014-02-18 DIAGNOSIS — D631 Anemia in chronic kidney disease: Secondary | ICD-10-CM | POA: Diagnosis not present

## 2014-02-18 DIAGNOSIS — Z23 Encounter for immunization: Secondary | ICD-10-CM | POA: Diagnosis not present

## 2014-02-18 DIAGNOSIS — D509 Iron deficiency anemia, unspecified: Secondary | ICD-10-CM | POA: Diagnosis not present

## 2014-02-18 DIAGNOSIS — N039 Chronic nephritic syndrome with unspecified morphologic changes: Secondary | ICD-10-CM | POA: Diagnosis not present

## 2014-02-21 ENCOUNTER — Other Ambulatory Visit: Payer: Self-pay | Admitting: *Deleted

## 2014-02-21 DIAGNOSIS — C50212 Malignant neoplasm of upper-inner quadrant of left female breast: Secondary | ICD-10-CM

## 2014-02-22 ENCOUNTER — Ambulatory Visit: Payer: Medicare Other | Admitting: Adult Health

## 2014-02-22 ENCOUNTER — Other Ambulatory Visit: Payer: Medicare Other

## 2014-03-03 DIAGNOSIS — N186 End stage renal disease: Secondary | ICD-10-CM | POA: Diagnosis not present

## 2014-03-03 DIAGNOSIS — T82898A Other specified complication of vascular prosthetic devices, implants and grafts, initial encounter: Secondary | ICD-10-CM | POA: Diagnosis not present

## 2014-03-03 DIAGNOSIS — I871 Compression of vein: Secondary | ICD-10-CM | POA: Diagnosis not present

## 2014-03-13 DIAGNOSIS — I509 Heart failure, unspecified: Secondary | ICD-10-CM | POA: Diagnosis not present

## 2014-03-13 DIAGNOSIS — Z6831 Body mass index (BMI) 31.0-31.9, adult: Secondary | ICD-10-CM | POA: Diagnosis not present

## 2014-03-13 DIAGNOSIS — F39 Unspecified mood [affective] disorder: Secondary | ICD-10-CM | POA: Diagnosis not present

## 2014-03-13 DIAGNOSIS — E1129 Type 2 diabetes mellitus with other diabetic kidney complication: Secondary | ICD-10-CM | POA: Diagnosis not present

## 2014-03-13 DIAGNOSIS — N186 End stage renal disease: Secondary | ICD-10-CM | POA: Diagnosis not present

## 2014-03-13 DIAGNOSIS — I10 Essential (primary) hypertension: Secondary | ICD-10-CM | POA: Diagnosis not present

## 2014-03-13 DIAGNOSIS — E785 Hyperlipidemia, unspecified: Secondary | ICD-10-CM | POA: Diagnosis not present

## 2014-03-20 DIAGNOSIS — N186 End stage renal disease: Secondary | ICD-10-CM | POA: Diagnosis not present

## 2014-03-21 DIAGNOSIS — N186 End stage renal disease: Secondary | ICD-10-CM | POA: Diagnosis not present

## 2014-03-21 DIAGNOSIS — Z23 Encounter for immunization: Secondary | ICD-10-CM | POA: Diagnosis not present

## 2014-03-21 DIAGNOSIS — E8779 Other fluid overload: Secondary | ICD-10-CM | POA: Diagnosis not present

## 2014-03-21 DIAGNOSIS — D631 Anemia in chronic kidney disease: Secondary | ICD-10-CM | POA: Diagnosis not present

## 2014-03-21 DIAGNOSIS — D509 Iron deficiency anemia, unspecified: Secondary | ICD-10-CM | POA: Diagnosis not present

## 2014-03-21 DIAGNOSIS — N2581 Secondary hyperparathyroidism of renal origin: Secondary | ICD-10-CM | POA: Diagnosis not present

## 2014-03-22 DIAGNOSIS — D631 Anemia in chronic kidney disease: Secondary | ICD-10-CM | POA: Diagnosis not present

## 2014-03-22 DIAGNOSIS — D509 Iron deficiency anemia, unspecified: Secondary | ICD-10-CM | POA: Diagnosis not present

## 2014-03-22 DIAGNOSIS — N2581 Secondary hyperparathyroidism of renal origin: Secondary | ICD-10-CM | POA: Diagnosis not present

## 2014-03-22 DIAGNOSIS — E8779 Other fluid overload: Secondary | ICD-10-CM | POA: Diagnosis not present

## 2014-03-22 DIAGNOSIS — N186 End stage renal disease: Secondary | ICD-10-CM | POA: Diagnosis not present

## 2014-03-22 DIAGNOSIS — Z23 Encounter for immunization: Secondary | ICD-10-CM | POA: Diagnosis not present

## 2014-03-22 DIAGNOSIS — D689 Coagulation defect, unspecified: Secondary | ICD-10-CM | POA: Insufficient documentation

## 2014-03-25 DIAGNOSIS — N2581 Secondary hyperparathyroidism of renal origin: Secondary | ICD-10-CM | POA: Diagnosis not present

## 2014-03-25 DIAGNOSIS — D509 Iron deficiency anemia, unspecified: Secondary | ICD-10-CM | POA: Diagnosis not present

## 2014-03-25 DIAGNOSIS — E8779 Other fluid overload: Secondary | ICD-10-CM | POA: Diagnosis not present

## 2014-03-25 DIAGNOSIS — D631 Anemia in chronic kidney disease: Secondary | ICD-10-CM | POA: Diagnosis not present

## 2014-03-25 DIAGNOSIS — N186 End stage renal disease: Secondary | ICD-10-CM | POA: Diagnosis not present

## 2014-03-25 DIAGNOSIS — Z23 Encounter for immunization: Secondary | ICD-10-CM | POA: Diagnosis not present

## 2014-03-25 DIAGNOSIS — N039 Chronic nephritic syndrome with unspecified morphologic changes: Secondary | ICD-10-CM | POA: Diagnosis not present

## 2014-03-27 DIAGNOSIS — Z23 Encounter for immunization: Secondary | ICD-10-CM | POA: Diagnosis not present

## 2014-03-27 DIAGNOSIS — D631 Anemia in chronic kidney disease: Secondary | ICD-10-CM | POA: Diagnosis not present

## 2014-03-27 DIAGNOSIS — N186 End stage renal disease: Secondary | ICD-10-CM | POA: Diagnosis not present

## 2014-03-27 DIAGNOSIS — N2581 Secondary hyperparathyroidism of renal origin: Secondary | ICD-10-CM | POA: Diagnosis not present

## 2014-03-27 DIAGNOSIS — E8779 Other fluid overload: Secondary | ICD-10-CM | POA: Diagnosis not present

## 2014-03-27 DIAGNOSIS — D509 Iron deficiency anemia, unspecified: Secondary | ICD-10-CM | POA: Diagnosis not present

## 2014-03-29 DIAGNOSIS — D631 Anemia in chronic kidney disease: Secondary | ICD-10-CM | POA: Diagnosis not present

## 2014-03-29 DIAGNOSIS — Z23 Encounter for immunization: Secondary | ICD-10-CM | POA: Diagnosis not present

## 2014-03-29 DIAGNOSIS — N186 End stage renal disease: Secondary | ICD-10-CM | POA: Diagnosis not present

## 2014-03-29 DIAGNOSIS — N2581 Secondary hyperparathyroidism of renal origin: Secondary | ICD-10-CM | POA: Diagnosis not present

## 2014-03-29 DIAGNOSIS — D509 Iron deficiency anemia, unspecified: Secondary | ICD-10-CM | POA: Diagnosis not present

## 2014-03-29 DIAGNOSIS — E8779 Other fluid overload: Secondary | ICD-10-CM | POA: Diagnosis not present

## 2014-03-31 DIAGNOSIS — E8779 Other fluid overload: Secondary | ICD-10-CM | POA: Diagnosis not present

## 2014-03-31 DIAGNOSIS — D509 Iron deficiency anemia, unspecified: Secondary | ICD-10-CM | POA: Diagnosis not present

## 2014-03-31 DIAGNOSIS — D631 Anemia in chronic kidney disease: Secondary | ICD-10-CM | POA: Diagnosis not present

## 2014-03-31 DIAGNOSIS — N186 End stage renal disease: Secondary | ICD-10-CM | POA: Diagnosis not present

## 2014-03-31 DIAGNOSIS — Z23 Encounter for immunization: Secondary | ICD-10-CM | POA: Diagnosis not present

## 2014-03-31 DIAGNOSIS — Z9001 Acquired absence of eye: Secondary | ICD-10-CM | POA: Insufficient documentation

## 2014-03-31 DIAGNOSIS — N039 Chronic nephritic syndrome with unspecified morphologic changes: Secondary | ICD-10-CM | POA: Diagnosis not present

## 2014-03-31 DIAGNOSIS — N2581 Secondary hyperparathyroidism of renal origin: Secondary | ICD-10-CM | POA: Diagnosis not present

## 2014-04-04 DIAGNOSIS — D509 Iron deficiency anemia, unspecified: Secondary | ICD-10-CM | POA: Diagnosis not present

## 2014-04-04 DIAGNOSIS — Z23 Encounter for immunization: Secondary | ICD-10-CM | POA: Diagnosis not present

## 2014-04-04 DIAGNOSIS — N2581 Secondary hyperparathyroidism of renal origin: Secondary | ICD-10-CM | POA: Diagnosis not present

## 2014-04-04 DIAGNOSIS — E8779 Other fluid overload: Secondary | ICD-10-CM | POA: Diagnosis not present

## 2014-04-04 DIAGNOSIS — N039 Chronic nephritic syndrome with unspecified morphologic changes: Secondary | ICD-10-CM | POA: Diagnosis not present

## 2014-04-04 DIAGNOSIS — D631 Anemia in chronic kidney disease: Secondary | ICD-10-CM | POA: Diagnosis not present

## 2014-04-04 DIAGNOSIS — N186 End stage renal disease: Secondary | ICD-10-CM | POA: Diagnosis not present

## 2014-04-05 DIAGNOSIS — Z23 Encounter for immunization: Secondary | ICD-10-CM | POA: Diagnosis not present

## 2014-04-05 DIAGNOSIS — N186 End stage renal disease: Secondary | ICD-10-CM | POA: Diagnosis not present

## 2014-04-05 DIAGNOSIS — D509 Iron deficiency anemia, unspecified: Secondary | ICD-10-CM | POA: Diagnosis not present

## 2014-04-05 DIAGNOSIS — N2581 Secondary hyperparathyroidism of renal origin: Secondary | ICD-10-CM | POA: Diagnosis not present

## 2014-04-05 DIAGNOSIS — E8779 Other fluid overload: Secondary | ICD-10-CM | POA: Diagnosis not present

## 2014-04-05 DIAGNOSIS — D631 Anemia in chronic kidney disease: Secondary | ICD-10-CM | POA: Diagnosis not present

## 2014-04-06 DIAGNOSIS — E1129 Type 2 diabetes mellitus with other diabetic kidney complication: Secondary | ICD-10-CM | POA: Diagnosis not present

## 2014-04-06 DIAGNOSIS — N186 End stage renal disease: Secondary | ICD-10-CM | POA: Diagnosis not present

## 2014-04-06 DIAGNOSIS — Z6831 Body mass index (BMI) 31.0-31.9, adult: Secondary | ICD-10-CM | POA: Diagnosis not present

## 2014-04-06 DIAGNOSIS — H3500 Unspecified background retinopathy: Secondary | ICD-10-CM | POA: Diagnosis not present

## 2014-04-06 DIAGNOSIS — D509 Iron deficiency anemia, unspecified: Secondary | ICD-10-CM | POA: Diagnosis not present

## 2014-04-06 DIAGNOSIS — I10 Essential (primary) hypertension: Secondary | ICD-10-CM | POA: Diagnosis not present

## 2014-04-06 DIAGNOSIS — I509 Heart failure, unspecified: Secondary | ICD-10-CM | POA: Diagnosis not present

## 2014-04-07 DIAGNOSIS — D631 Anemia in chronic kidney disease: Secondary | ICD-10-CM | POA: Diagnosis not present

## 2014-04-07 DIAGNOSIS — E8779 Other fluid overload: Secondary | ICD-10-CM | POA: Diagnosis not present

## 2014-04-07 DIAGNOSIS — N186 End stage renal disease: Secondary | ICD-10-CM | POA: Diagnosis not present

## 2014-04-07 DIAGNOSIS — N2581 Secondary hyperparathyroidism of renal origin: Secondary | ICD-10-CM | POA: Diagnosis not present

## 2014-04-07 DIAGNOSIS — D509 Iron deficiency anemia, unspecified: Secondary | ICD-10-CM | POA: Diagnosis not present

## 2014-04-07 DIAGNOSIS — Z23 Encounter for immunization: Secondary | ICD-10-CM | POA: Diagnosis not present

## 2014-04-07 DIAGNOSIS — N039 Chronic nephritic syndrome with unspecified morphologic changes: Secondary | ICD-10-CM | POA: Diagnosis not present

## 2014-04-10 DIAGNOSIS — D631 Anemia in chronic kidney disease: Secondary | ICD-10-CM | POA: Diagnosis not present

## 2014-04-10 DIAGNOSIS — N2581 Secondary hyperparathyroidism of renal origin: Secondary | ICD-10-CM | POA: Diagnosis not present

## 2014-04-10 DIAGNOSIS — D509 Iron deficiency anemia, unspecified: Secondary | ICD-10-CM | POA: Diagnosis not present

## 2014-04-10 DIAGNOSIS — Z23 Encounter for immunization: Secondary | ICD-10-CM | POA: Diagnosis not present

## 2014-04-10 DIAGNOSIS — E8779 Other fluid overload: Secondary | ICD-10-CM | POA: Diagnosis not present

## 2014-04-10 DIAGNOSIS — N186 End stage renal disease: Secondary | ICD-10-CM | POA: Diagnosis not present

## 2014-04-12 DIAGNOSIS — E8779 Other fluid overload: Secondary | ICD-10-CM | POA: Diagnosis not present

## 2014-04-12 DIAGNOSIS — N039 Chronic nephritic syndrome with unspecified morphologic changes: Secondary | ICD-10-CM | POA: Diagnosis not present

## 2014-04-12 DIAGNOSIS — N2581 Secondary hyperparathyroidism of renal origin: Secondary | ICD-10-CM | POA: Diagnosis not present

## 2014-04-12 DIAGNOSIS — Z23 Encounter for immunization: Secondary | ICD-10-CM | POA: Diagnosis not present

## 2014-04-12 DIAGNOSIS — D509 Iron deficiency anemia, unspecified: Secondary | ICD-10-CM | POA: Diagnosis not present

## 2014-04-12 DIAGNOSIS — N186 End stage renal disease: Secondary | ICD-10-CM | POA: Diagnosis not present

## 2014-04-12 DIAGNOSIS — D631 Anemia in chronic kidney disease: Secondary | ICD-10-CM | POA: Diagnosis not present

## 2014-04-13 DIAGNOSIS — E11359 Type 2 diabetes mellitus with proliferative diabetic retinopathy without macular edema: Secondary | ICD-10-CM | POA: Diagnosis not present

## 2014-04-13 DIAGNOSIS — H52209 Unspecified astigmatism, unspecified eye: Secondary | ICD-10-CM | POA: Diagnosis not present

## 2014-04-13 DIAGNOSIS — Z961 Presence of intraocular lens: Secondary | ICD-10-CM | POA: Diagnosis not present

## 2014-04-13 DIAGNOSIS — E1139 Type 2 diabetes mellitus with other diabetic ophthalmic complication: Secondary | ICD-10-CM | POA: Diagnosis not present

## 2014-04-14 DIAGNOSIS — D509 Iron deficiency anemia, unspecified: Secondary | ICD-10-CM | POA: Diagnosis not present

## 2014-04-14 DIAGNOSIS — N186 End stage renal disease: Secondary | ICD-10-CM | POA: Diagnosis not present

## 2014-04-14 DIAGNOSIS — E8779 Other fluid overload: Secondary | ICD-10-CM | POA: Diagnosis not present

## 2014-04-14 DIAGNOSIS — N039 Chronic nephritic syndrome with unspecified morphologic changes: Secondary | ICD-10-CM | POA: Diagnosis not present

## 2014-04-14 DIAGNOSIS — N2581 Secondary hyperparathyroidism of renal origin: Secondary | ICD-10-CM | POA: Diagnosis not present

## 2014-04-14 DIAGNOSIS — Z23 Encounter for immunization: Secondary | ICD-10-CM | POA: Diagnosis not present

## 2014-04-14 DIAGNOSIS — D631 Anemia in chronic kidney disease: Secondary | ICD-10-CM | POA: Diagnosis not present

## 2014-04-15 DIAGNOSIS — H44529 Atrophy of globe, unspecified eye: Secondary | ICD-10-CM | POA: Diagnosis not present

## 2014-04-17 DIAGNOSIS — Z23 Encounter for immunization: Secondary | ICD-10-CM | POA: Diagnosis not present

## 2014-04-17 DIAGNOSIS — D509 Iron deficiency anemia, unspecified: Secondary | ICD-10-CM | POA: Diagnosis not present

## 2014-04-17 DIAGNOSIS — D631 Anemia in chronic kidney disease: Secondary | ICD-10-CM | POA: Diagnosis not present

## 2014-04-17 DIAGNOSIS — N186 End stage renal disease: Secondary | ICD-10-CM | POA: Diagnosis not present

## 2014-04-17 DIAGNOSIS — N2581 Secondary hyperparathyroidism of renal origin: Secondary | ICD-10-CM | POA: Diagnosis not present

## 2014-04-17 DIAGNOSIS — E8779 Other fluid overload: Secondary | ICD-10-CM | POA: Diagnosis not present

## 2014-04-19 DIAGNOSIS — D509 Iron deficiency anemia, unspecified: Secondary | ICD-10-CM | POA: Diagnosis not present

## 2014-04-19 DIAGNOSIS — D631 Anemia in chronic kidney disease: Secondary | ICD-10-CM | POA: Diagnosis not present

## 2014-04-19 DIAGNOSIS — Z23 Encounter for immunization: Secondary | ICD-10-CM | POA: Diagnosis not present

## 2014-04-19 DIAGNOSIS — N2581 Secondary hyperparathyroidism of renal origin: Secondary | ICD-10-CM | POA: Diagnosis not present

## 2014-04-19 DIAGNOSIS — N186 End stage renal disease: Secondary | ICD-10-CM | POA: Diagnosis not present

## 2014-04-19 DIAGNOSIS — E8779 Other fluid overload: Secondary | ICD-10-CM | POA: Diagnosis not present

## 2014-04-21 DIAGNOSIS — N2581 Secondary hyperparathyroidism of renal origin: Secondary | ICD-10-CM | POA: Diagnosis not present

## 2014-04-21 DIAGNOSIS — Z23 Encounter for immunization: Secondary | ICD-10-CM | POA: Diagnosis not present

## 2014-04-21 DIAGNOSIS — N186 End stage renal disease: Secondary | ICD-10-CM | POA: Diagnosis not present

## 2014-04-21 DIAGNOSIS — D631 Anemia in chronic kidney disease: Secondary | ICD-10-CM | POA: Diagnosis not present

## 2014-04-21 DIAGNOSIS — D509 Iron deficiency anemia, unspecified: Secondary | ICD-10-CM | POA: Diagnosis not present

## 2014-05-20 DIAGNOSIS — N186 End stage renal disease: Secondary | ICD-10-CM | POA: Diagnosis not present

## 2014-05-20 DIAGNOSIS — Z992 Dependence on renal dialysis: Secondary | ICD-10-CM | POA: Diagnosis not present

## 2014-05-22 DIAGNOSIS — N186 End stage renal disease: Secondary | ICD-10-CM | POA: Diagnosis not present

## 2014-05-22 DIAGNOSIS — D509 Iron deficiency anemia, unspecified: Secondary | ICD-10-CM | POA: Diagnosis not present

## 2014-05-22 DIAGNOSIS — N2581 Secondary hyperparathyroidism of renal origin: Secondary | ICD-10-CM | POA: Diagnosis not present

## 2014-05-22 DIAGNOSIS — D631 Anemia in chronic kidney disease: Secondary | ICD-10-CM | POA: Diagnosis not present

## 2014-05-25 DIAGNOSIS — H35361 Drusen (degenerative) of macula, right eye: Secondary | ICD-10-CM | POA: Diagnosis not present

## 2014-05-25 DIAGNOSIS — E11359 Type 2 diabetes mellitus with proliferative diabetic retinopathy without macular edema: Secondary | ICD-10-CM | POA: Diagnosis not present

## 2014-06-19 DIAGNOSIS — N186 End stage renal disease: Secondary | ICD-10-CM | POA: Diagnosis not present

## 2014-06-19 DIAGNOSIS — Z992 Dependence on renal dialysis: Secondary | ICD-10-CM | POA: Diagnosis not present

## 2014-06-21 DIAGNOSIS — D509 Iron deficiency anemia, unspecified: Secondary | ICD-10-CM | POA: Diagnosis not present

## 2014-06-21 DIAGNOSIS — N186 End stage renal disease: Secondary | ICD-10-CM | POA: Diagnosis not present

## 2014-06-21 DIAGNOSIS — D631 Anemia in chronic kidney disease: Secondary | ICD-10-CM | POA: Diagnosis not present

## 2014-06-21 DIAGNOSIS — N2581 Secondary hyperparathyroidism of renal origin: Secondary | ICD-10-CM | POA: Diagnosis not present

## 2014-07-18 DIAGNOSIS — H3532 Exudative age-related macular degeneration: Secondary | ICD-10-CM | POA: Diagnosis not present

## 2014-07-18 DIAGNOSIS — H3561 Retinal hemorrhage, right eye: Secondary | ICD-10-CM | POA: Diagnosis not present

## 2014-07-18 DIAGNOSIS — E11359 Type 2 diabetes mellitus with proliferative diabetic retinopathy without macular edema: Secondary | ICD-10-CM | POA: Diagnosis not present

## 2014-07-20 DIAGNOSIS — Z992 Dependence on renal dialysis: Secondary | ICD-10-CM | POA: Diagnosis not present

## 2014-07-20 DIAGNOSIS — N186 End stage renal disease: Secondary | ICD-10-CM | POA: Diagnosis not present

## 2014-07-22 DIAGNOSIS — D631 Anemia in chronic kidney disease: Secondary | ICD-10-CM | POA: Diagnosis not present

## 2014-07-22 DIAGNOSIS — D509 Iron deficiency anemia, unspecified: Secondary | ICD-10-CM | POA: Diagnosis not present

## 2014-07-22 DIAGNOSIS — N2581 Secondary hyperparathyroidism of renal origin: Secondary | ICD-10-CM | POA: Diagnosis not present

## 2014-07-22 DIAGNOSIS — N186 End stage renal disease: Secondary | ICD-10-CM | POA: Diagnosis not present

## 2014-07-24 DIAGNOSIS — N186 End stage renal disease: Secondary | ICD-10-CM | POA: Diagnosis not present

## 2014-07-24 DIAGNOSIS — N2581 Secondary hyperparathyroidism of renal origin: Secondary | ICD-10-CM | POA: Diagnosis not present

## 2014-07-24 DIAGNOSIS — D509 Iron deficiency anemia, unspecified: Secondary | ICD-10-CM | POA: Diagnosis not present

## 2014-07-24 DIAGNOSIS — D631 Anemia in chronic kidney disease: Secondary | ICD-10-CM | POA: Diagnosis not present

## 2014-07-26 DIAGNOSIS — N186 End stage renal disease: Secondary | ICD-10-CM | POA: Diagnosis not present

## 2014-07-26 DIAGNOSIS — D509 Iron deficiency anemia, unspecified: Secondary | ICD-10-CM | POA: Diagnosis not present

## 2014-07-26 DIAGNOSIS — N2581 Secondary hyperparathyroidism of renal origin: Secondary | ICD-10-CM | POA: Diagnosis not present

## 2014-07-26 DIAGNOSIS — D631 Anemia in chronic kidney disease: Secondary | ICD-10-CM | POA: Diagnosis not present

## 2014-07-28 ENCOUNTER — Emergency Department (HOSPITAL_COMMUNITY)
Admission: EM | Admit: 2014-07-28 | Discharge: 2014-07-28 | Disposition: A | Discharge status: Home / Self Care | Payer: Medicare Other | Attending: Emergency Medicine | Admitting: Emergency Medicine

## 2014-07-28 ENCOUNTER — Emergency Department (HOSPITAL_COMMUNITY): Payer: Medicare Other

## 2014-07-28 ENCOUNTER — Encounter (HOSPITAL_COMMUNITY): Payer: Self-pay | Admitting: Emergency Medicine

## 2014-07-28 DIAGNOSIS — I120 Hypertensive chronic kidney disease with stage 5 chronic kidney disease or end stage renal disease: Secondary | ICD-10-CM | POA: Diagnosis not present

## 2014-07-28 DIAGNOSIS — M1990 Unspecified osteoarthritis, unspecified site: Secondary | ICD-10-CM | POA: Diagnosis not present

## 2014-07-28 DIAGNOSIS — D509 Iron deficiency anemia, unspecified: Secondary | ICD-10-CM | POA: Diagnosis not present

## 2014-07-28 DIAGNOSIS — F419 Anxiety disorder, unspecified: Secondary | ICD-10-CM | POA: Insufficient documentation

## 2014-07-28 DIAGNOSIS — Z8669 Personal history of other diseases of the nervous system and sense organs: Secondary | ICD-10-CM | POA: Diagnosis not present

## 2014-07-28 DIAGNOSIS — Z992 Dependence on renal dialysis: Secondary | ICD-10-CM | POA: Insufficient documentation

## 2014-07-28 DIAGNOSIS — R0989 Other specified symptoms and signs involving the circulatory and respiratory systems: Secondary | ICD-10-CM

## 2014-07-28 DIAGNOSIS — N186 End stage renal disease: Secondary | ICD-10-CM | POA: Insufficient documentation

## 2014-07-28 DIAGNOSIS — M791 Myalgia: Secondary | ICD-10-CM | POA: Insufficient documentation

## 2014-07-28 DIAGNOSIS — Z853 Personal history of malignant neoplasm of breast: Secondary | ICD-10-CM | POA: Diagnosis not present

## 2014-07-28 DIAGNOSIS — D631 Anemia in chronic kidney disease: Secondary | ICD-10-CM | POA: Diagnosis not present

## 2014-07-28 DIAGNOSIS — R0602 Shortness of breath: Secondary | ICD-10-CM | POA: Insufficient documentation

## 2014-07-28 DIAGNOSIS — Z794 Long term (current) use of insulin: Secondary | ICD-10-CM | POA: Insufficient documentation

## 2014-07-28 DIAGNOSIS — Z7982 Long term (current) use of aspirin: Secondary | ICD-10-CM | POA: Insufficient documentation

## 2014-07-28 DIAGNOSIS — Z79899 Other long term (current) drug therapy: Secondary | ICD-10-CM | POA: Diagnosis not present

## 2014-07-28 DIAGNOSIS — Z791 Long term (current) use of non-steroidal anti-inflammatories (NSAID): Secondary | ICD-10-CM | POA: Diagnosis not present

## 2014-07-28 DIAGNOSIS — J811 Chronic pulmonary edema: Secondary | ICD-10-CM | POA: Insufficient documentation

## 2014-07-28 DIAGNOSIS — N2581 Secondary hyperparathyroidism of renal origin: Secondary | ICD-10-CM | POA: Diagnosis not present

## 2014-07-28 DIAGNOSIS — Z862 Personal history of diseases of the blood and blood-forming organs and certain disorders involving the immune mechanism: Secondary | ICD-10-CM | POA: Diagnosis not present

## 2014-07-28 DIAGNOSIS — K219 Gastro-esophageal reflux disease without esophagitis: Secondary | ICD-10-CM | POA: Insufficient documentation

## 2014-07-28 DIAGNOSIS — E119 Type 2 diabetes mellitus without complications: Secondary | ICD-10-CM | POA: Diagnosis not present

## 2014-07-28 LAB — BASIC METABOLIC PANEL
ANION GAP: 15 (ref 5–15)
BUN: 29 mg/dL — AB (ref 6–23)
CO2: 23 mmol/L (ref 19–32)
CREATININE: 7.77 mg/dL — AB (ref 0.50–1.10)
Calcium: 8.7 mg/dL (ref 8.4–10.5)
Chloride: 96 mEq/L (ref 96–112)
GFR calc Af Amer: 6 mL/min — ABNORMAL LOW (ref >90)
GFR, EST NON AFRICAN AMERICAN: 5 mL/min — AB (ref >90)
GLUCOSE: 225 mg/dL — AB (ref 70–99)
POTASSIUM: 5 mmol/L (ref 3.5–5.1)
SODIUM: 134 mmol/L — AB (ref 135–145)

## 2014-07-28 LAB — CBC WITH DIFFERENTIAL/PLATELET
BASOS PCT: 1 % (ref 0–1)
Basophils Absolute: 0.1 10*3/uL (ref 0.0–0.1)
EOS ABS: 0.2 10*3/uL (ref 0.0–0.7)
Eosinophils Relative: 4 % (ref 0–5)
HEMATOCRIT: 30.5 % — AB (ref 36.0–46.0)
HEMOGLOBIN: 9.8 g/dL — AB (ref 12.0–15.0)
Lymphocytes Relative: 25 % (ref 12–46)
Lymphs Abs: 1.7 10*3/uL (ref 0.7–4.0)
MCH: 30.2 pg (ref 26.0–34.0)
MCHC: 32.1 g/dL (ref 30.0–36.0)
MCV: 93.8 fL (ref 78.0–100.0)
Monocytes Absolute: 0.3 10*3/uL (ref 0.1–1.0)
Monocytes Relative: 5 % (ref 3–12)
NEUTROS PCT: 65 % (ref 43–77)
Neutro Abs: 4.3 10*3/uL (ref 1.7–7.7)
Platelets: 270 10*3/uL (ref 150–400)
RBC: 3.25 MIL/uL — ABNORMAL LOW (ref 3.87–5.11)
RDW: 15.8 % — ABNORMAL HIGH (ref 11.5–15.5)
WBC: 6.6 10*3/uL (ref 4.0–10.5)

## 2014-07-28 LAB — I-STAT TROPONIN, ED: Troponin i, poc: 0.03 ng/mL (ref 0.00–0.08)

## 2014-07-28 NOTE — ED Provider Notes (Signed)
CSN: [id number]     Arrival date & time 07/28/14  [id number] History   First MD Initiated Contact with Patient 07/28/14 [phone]     Chief Complaint  Patient presents with  . Shortness of Breath  . Chest Pain     (Consider location/radiation/quality/duration/timing/severity/associated sxs/prior Treatment) HPI Comments: Patient with a history of CHF, Breast Cancer, DM, and ESRD currently on dialysis presents today with a chief complaint of SOB.  She reports that she became SOB around 3 AM this morning.  SOB unchanged from onset.  At that time she had some chest tightness, which has completely resolved at this time. She states that her last dialysis was two days ago.  She is scheduled for dialysis at 12:00 noon today.  She reports that she has had similar symptoms in the past with fluid overload that usually resolves with dialysis.  She reports that she completed radiation and chemotherapy for her Breast Cancer in 2012.  She denies history of PE or DVT.  Denies prolonged travel or surgeries in the past 4 weeks.  Denies LE edema or pain.    She reports mild associated cough.  She denies fever, chills, nausea, vomiting, numbness, or tingling.    Patient is a 64 y.o. female presenting with shortness of breath and chest pain. The history is provided by the patient.  Shortness of Breath Associated symptoms: chest pain   Chest Pain Associated symptoms: shortness of breath     Past Medical History  Diagnosis Date  . Hypertension   . GERD (gastroesophageal reflux disease)   . Anemia   . Myalgia 12/31/2011  . Neuropathy 12/31/2011  . Shortness of breath     "lately it's been all the time" (06/15/2013)  . Type II diabetes mellitus   . Arthritis     "joints" (06/15/2013)  . Breast cancer     left  . Anxiety   . ESRD (end stage renal disease)     "suppose to start dialysis today" (06/15/2013)   Past Surgical History  Procedure Laterality Date  . Cesarean section  1980  . Tonsillectomy    . Av fistula  placement Right 06/06/2013    Procedure: ARTERIOVENOUS (AV) FISTULA CREATION-RIGHT BRACHIAL CEPHALIC;  Surgeon: Conrad Iota, MD;  Location: [facility];  Service: Vascular;  Laterality: Right;  . Removal of a dialysis catheter Right 06/06/2013    Procedure: REMOVAL OF RIGHT MEDIPORT;  Surgeon: Conrad [place], MD;  Location: [facility];  Service: Vascular;  Laterality: Right;  . Cataract extraction w/ anterior vitrectomy Bilateral   . Refractive surgery Bilateral   . Eye surgery Bilateral     laser surgery  . Abdominal hysterectomy      partial  . Breast biopsy Left   . Breast lumpectomy Left     "and took out some lymph nodes" (06/15/2013)   Family History  Problem Relation Age of Onset  . Diabetes Mother   . Hyperlipidemia Mother   . Hypertension Mother   . Hypertension Father   . Diabetes Sister   . Diabetes Brother   . Hypertension Brother   . Heart attack Brother    History  Substance Use Topics  . Smoking status: Never Smoker   . Smokeless tobacco: Never Used  . Alcohol Use: No   OB History    No data available     Review of Systems  Respiratory: Positive for shortness of breath.   Cardiovascular: Positive for chest pain.  All other systems reviewed and  are negative.     Allergies  Review of patient's allergies indicates no known allergies.  Home Medications   Prior to Admission medications   Medication Sig Start Date End Date Taking? Authorizing Provider  acetaminophen (TYLENOL) 325 MG tablet Take 650 mg by mouth every 6 (six) hours as needed for mild pain.    Yes Historical Provider, MD  aspirin 81 MG tablet Take 81 mg by mouth daily.     Yes Historical Provider, MD  carvedilol (COREG) 12.5 MG tablet Take 12.5 mg by mouth 2 (two) times daily with a meal.   Yes Historical Provider, MD  cloNIDine (CATAPRES) 0.1 MG tablet Take 0.1 mg by mouth 2 (two) times daily.   Yes Historical Provider, MD  cyclobenzaprine (FLEXERIL) 5 MG tablet Take 1 tablet (5 mg total) by mouth 2  (two) times daily as needed for muscle spasms. 01/08/14  Yes Thao P Le, DO  insulin lispro (HUMALOG KWIKPEN) 100 UNIT/ML SOPN Inject 12-16 Units into the skin 2 (two) times daily. SSI   Yes Historical Provider, MD  lanthanum (FOSRENOL) 1000 MG chewable tablet Chew 1,000 mg by mouth 2 (two) times daily with a meal.   Yes Historical Provider, MD  lisinopril (PRINIVIL,ZESTRIL) 40 MG tablet Take 40 mg by mouth daily.   Yes Historical Provider, MD  multivitamin (RENA-VIT) TABS tablet Take 1 tablet by mouth daily.   Yes Historical Provider, MD  naproxen (NAPROSYN) 250 MG tablet Take 250 mg by mouth 2 (two) times daily as needed for mild pain.   Yes Historical Provider, MD  omeprazole (PRILOSEC) 20 MG capsule Take 20 mg by mouth daily.   Yes Historical Provider, MD  ondansetron (ZOFRAN) 4 MG tablet Take 4 mg by mouth every 8 (eight) hours as needed for nausea or vomiting.   Yes Historical Provider, MD  OVER THE COUNTER MEDICATION Take 2 capsules by mouth daily. Stool Softner   Yes Historical Provider, MD  amLODipine (NORVASC) 10 MG tablet Take 10 mg by mouth daily.    Historical Provider, MD  carvedilol (COREG) 6.25 MG tablet Take 1 tablet (6.25 mg total) by mouth 2 (two) times daily. Patient not taking: Reported on 07/28/2014 10/12/13   Thayer Headings, MD  darbepoetin Kyra Searles) 200 MCG/0.4ML SOLN injection Inject 0.4 mLs (200 mcg total) into the vein every Wednesday with hemodialysis. Patient not taking: Reported on 07/28/2014 06/22/13   Geradine Girt, DO  doxercalciferol (HECTOROL) 4 MCG/2ML injection Inject 0.5 mLs (1 mcg total) into the vein every Monday, Wednesday, and Friday with hemodialysis. Patient not taking: Reported on 07/28/2014 06/18/13   Geradine Girt, DO  traMADol (ULTRAM) 50 MG tablet Take 50 mg by mouth every 6 (six) hours as needed.     Historical Provider, MD   BP 189/71 mmHg  Pulse 87  Temp(Src) 98.6 F (37 C) (Oral)  Resp 23  Ht 5\' 5"  (1.651 m)  Wt 180 lb (81.647 kg)  BMI 29.95  kg/m2  SpO2 94% Physical Exam  Constitutional: She appears well-developed and well-nourished.  HENT:  Head: Normocephalic and atraumatic.  Neck: Normal range of motion. Neck supple.  Cardiovascular: Normal rate, regular rhythm and normal heart sounds.   Pulmonary/Chest: Effort normal and breath sounds normal. No respiratory distress.  Patient speaking in complete sentences Bilateral crackles at the bases of both lungs  Abdominal: Soft. Bowel sounds are normal. She exhibits no distension and no mass. There is no tenderness. There is no rebound and no guarding.  Musculoskeletal: Normal  range of motion.  Neurological: She is alert.  Skin: Skin is warm and dry.  Psychiatric: She has a normal mood and affect.  Nursing note and vitals reviewed.   ED Course  Procedures (including critical care time) Labs Review Labs Reviewed  CBC WITH DIFFERENTIAL  BASIC METABOLIC PANEL  Randolm Idol, ED    Imaging Review Dg Chest 2 View  07/28/2014   CLINICAL DATA:  Shortness of breath, renal failure  EXAM: CHEST  2 VIEW  COMPARISON:  01/08/2014  FINDINGS: Cardiac shadow remains enlarged. Diffuse vascular congestion and interstitial edema is noted consistent with volume overload. No focal infiltrate or sizable effusion is seen. No bony abnormality is noted.  IMPRESSION: Changes consistent with CHF with interstitial edema.   Electronically Signed   By: Inez Catalina M.D.   On: 07/28/2014 07:03     EKG Interpretation   Date/Time:  Friday July 28 2014 06:22:09 EST Ventricular Rate:  92 PR Interval:  150 QRS Duration: 98 QT Interval:  379 QTC Calculation: 469 R Axis:   45 Text Interpretation:  Sinus rhythm LVH with secondary repolarization  abnormality No significant change since last tracing Confirmed by YAO  MD,  DAVID ([id number]) on 07/28/2014 6:29:07 AM     9:13 AM Discussed with Gerald Stabs at [facility].  He reports that the patient can go to the Dialysis Center now for dialysis. MDM    Final diagnoses:  SOB (shortness of breath)   Patient with a history CHF, Breast Cancer, and ESRD on dialysis presents today with SOB.  Last dialysis was 2 days ago and she is due for dialysis again today.  No ischemic changes on EKG.  Troponin negaitve.  CXR showing evidence of fluid overload.  Labs unremarkable.  Patient is not hypoxic.  No signs of respiratory distress.  Dialysis Center reports that she can go directly there for dialysis immediately upon discharge from the ED.  Feel that the patient is stable for discharge.  Strict return precautions given.     Hyman Bible, PA-C 07/30/14 [place], MD 07/31/14 [phone]

## 2014-07-28 NOTE — ED Notes (Signed)
Heather, PA-C, at the bedside.

## 2014-07-28 NOTE — ED Notes (Signed)
Per EMS, patient complains of shortness of breath and dialysis patient on mon, wed, and fri. Pressure in chest experienced. 12 lead unremarkable, 94 normal sinus on the monitor. She was 88% on room air O2 sat, placed on nonrebreather, increased to 100%.  Lungs clear. Fistula in right arm. No bp in left arm due to previous breast cancer and surgery in 2012.  Even with use of nonrebreather, patient reports difficulty breathing. Verbally deesclated for ease of breathing. EMS vitals: bp 160/86, p 94.

## 2014-07-28 NOTE — ED Notes (Signed)
Heather, PA-C, allows blood draw from left arm at this time if ordered.

## 2014-07-28 NOTE — ED Notes (Signed)
Discussed limited extremity access for lab draw with Heather, PA-C. She acknwoledges, no new orders at this time.

## 2014-07-31 DIAGNOSIS — N2581 Secondary hyperparathyroidism of renal origin: Secondary | ICD-10-CM | POA: Diagnosis not present

## 2014-07-31 DIAGNOSIS — D631 Anemia in chronic kidney disease: Secondary | ICD-10-CM | POA: Diagnosis not present

## 2014-07-31 DIAGNOSIS — D509 Iron deficiency anemia, unspecified: Secondary | ICD-10-CM | POA: Diagnosis not present

## 2014-07-31 DIAGNOSIS — N186 End stage renal disease: Secondary | ICD-10-CM | POA: Diagnosis not present

## 2014-08-02 DIAGNOSIS — D509 Iron deficiency anemia, unspecified: Secondary | ICD-10-CM | POA: Diagnosis not present

## 2014-08-02 DIAGNOSIS — D631 Anemia in chronic kidney disease: Secondary | ICD-10-CM | POA: Diagnosis not present

## 2014-08-02 DIAGNOSIS — N2581 Secondary hyperparathyroidism of renal origin: Secondary | ICD-10-CM | POA: Diagnosis not present

## 2014-08-02 DIAGNOSIS — N186 End stage renal disease: Secondary | ICD-10-CM | POA: Diagnosis not present

## 2014-08-04 DIAGNOSIS — D631 Anemia in chronic kidney disease: Secondary | ICD-10-CM | POA: Diagnosis not present

## 2014-08-04 DIAGNOSIS — N2581 Secondary hyperparathyroidism of renal origin: Secondary | ICD-10-CM | POA: Diagnosis not present

## 2014-08-04 DIAGNOSIS — D509 Iron deficiency anemia, unspecified: Secondary | ICD-10-CM | POA: Diagnosis not present

## 2014-08-04 DIAGNOSIS — N186 End stage renal disease: Secondary | ICD-10-CM | POA: Diagnosis not present

## 2014-08-07 DIAGNOSIS — N186 End stage renal disease: Secondary | ICD-10-CM | POA: Diagnosis not present

## 2014-08-07 DIAGNOSIS — D509 Iron deficiency anemia, unspecified: Secondary | ICD-10-CM | POA: Diagnosis not present

## 2014-08-07 DIAGNOSIS — D631 Anemia in chronic kidney disease: Secondary | ICD-10-CM | POA: Diagnosis not present

## 2014-08-07 DIAGNOSIS — N2581 Secondary hyperparathyroidism of renal origin: Secondary | ICD-10-CM | POA: Diagnosis not present

## 2014-08-09 ENCOUNTER — Encounter (HOSPITAL_COMMUNITY): Payer: Self-pay | Admitting: Emergency Medicine

## 2014-08-09 ENCOUNTER — Emergency Department (HOSPITAL_COMMUNITY)
Admission: EM | Admit: 2014-08-09 | Discharge: 2014-08-09 | Disposition: A | Discharge status: Home / Self Care | Payer: Medicare Other | Attending: Emergency Medicine | Admitting: Emergency Medicine

## 2014-08-09 ENCOUNTER — Emergency Department (HOSPITAL_COMMUNITY): Payer: Medicare Other

## 2014-08-09 DIAGNOSIS — I517 Cardiomegaly: Secondary | ICD-10-CM | POA: Diagnosis not present

## 2014-08-09 DIAGNOSIS — Z7982 Long term (current) use of aspirin: Secondary | ICD-10-CM | POA: Diagnosis not present

## 2014-08-09 DIAGNOSIS — N186 End stage renal disease: Secondary | ICD-10-CM | POA: Diagnosis not present

## 2014-08-09 DIAGNOSIS — M1990 Unspecified osteoarthritis, unspecified site: Secondary | ICD-10-CM | POA: Diagnosis not present

## 2014-08-09 DIAGNOSIS — R05 Cough: Secondary | ICD-10-CM | POA: Diagnosis not present

## 2014-08-09 DIAGNOSIS — Z862 Personal history of diseases of the blood and blood-forming organs and certain disorders involving the immune mechanism: Secondary | ICD-10-CM | POA: Insufficient documentation

## 2014-08-09 DIAGNOSIS — Z79899 Other long term (current) drug therapy: Secondary | ICD-10-CM | POA: Insufficient documentation

## 2014-08-09 DIAGNOSIS — R059 Cough, unspecified: Secondary | ICD-10-CM

## 2014-08-09 DIAGNOSIS — J40 Bronchitis, not specified as acute or chronic: Secondary | ICD-10-CM | POA: Diagnosis not present

## 2014-08-09 DIAGNOSIS — E119 Type 2 diabetes mellitus without complications: Secondary | ICD-10-CM | POA: Insufficient documentation

## 2014-08-09 DIAGNOSIS — Z8669 Personal history of other diseases of the nervous system and sense organs: Secondary | ICD-10-CM | POA: Insufficient documentation

## 2014-08-09 DIAGNOSIS — I120 Hypertensive chronic kidney disease with stage 5 chronic kidney disease or end stage renal disease: Secondary | ICD-10-CM | POA: Diagnosis not present

## 2014-08-09 DIAGNOSIS — K219 Gastro-esophageal reflux disease without esophagitis: Secondary | ICD-10-CM | POA: Insufficient documentation

## 2014-08-09 DIAGNOSIS — N2581 Secondary hyperparathyroidism of renal origin: Secondary | ICD-10-CM | POA: Diagnosis not present

## 2014-08-09 DIAGNOSIS — Z794 Long term (current) use of insulin: Secondary | ICD-10-CM | POA: Insufficient documentation

## 2014-08-09 DIAGNOSIS — Z853 Personal history of malignant neoplasm of breast: Secondary | ICD-10-CM | POA: Insufficient documentation

## 2014-08-09 DIAGNOSIS — R110 Nausea: Secondary | ICD-10-CM | POA: Insufficient documentation

## 2014-08-09 DIAGNOSIS — R0602 Shortness of breath: Secondary | ICD-10-CM

## 2014-08-09 DIAGNOSIS — D509 Iron deficiency anemia, unspecified: Secondary | ICD-10-CM | POA: Diagnosis not present

## 2014-08-09 DIAGNOSIS — D631 Anemia in chronic kidney disease: Secondary | ICD-10-CM | POA: Diagnosis not present

## 2014-08-09 DIAGNOSIS — F419 Anxiety disorder, unspecified: Secondary | ICD-10-CM | POA: Diagnosis not present

## 2014-08-09 DIAGNOSIS — Z992 Dependence on renal dialysis: Secondary | ICD-10-CM | POA: Diagnosis not present

## 2014-08-09 LAB — CBC WITH DIFFERENTIAL/PLATELET
BASOS ABS: 0 10*3/uL (ref 0.0–0.1)
BASOS PCT: 0 % (ref 0–1)
Eosinophils Absolute: 0.3 10*3/uL (ref 0.0–0.7)
Eosinophils Relative: 4 % (ref 0–5)
HCT: 31.7 % — ABNORMAL LOW (ref 36.0–46.0)
HEMOGLOBIN: 10.4 g/dL — AB (ref 12.0–15.0)
Lymphocytes Relative: 23 % (ref 12–46)
Lymphs Abs: 1.8 10*3/uL (ref 0.7–4.0)
MCH: 29.9 pg (ref 26.0–34.0)
MCHC: 32.8 g/dL (ref 30.0–36.0)
MCV: 91.1 fL (ref 78.0–100.0)
Monocytes Absolute: 0.6 10*3/uL (ref 0.1–1.0)
Monocytes Relative: 8 % (ref 3–12)
NEUTROS ABS: 4.9 10*3/uL (ref 1.7–7.7)
NEUTROS PCT: 65 % (ref 43–77)
Platelets: 251 10*3/uL (ref 150–400)
RBC: 3.48 MIL/uL — ABNORMAL LOW (ref 3.87–5.11)
RDW: 15.9 % — ABNORMAL HIGH (ref 11.5–15.5)
WBC: 7.6 10*3/uL (ref 4.0–10.5)

## 2014-08-09 LAB — BASIC METABOLIC PANEL
ANION GAP: 15 (ref 5–15)
BUN: 37 mg/dL — AB (ref 6–23)
CO2: 25 mmol/L (ref 19–32)
CREATININE: 7.77 mg/dL — AB (ref 0.50–1.10)
Calcium: 9.4 mg/dL (ref 8.4–10.5)
Chloride: 99 mEq/L (ref 96–112)
GFR calc non Af Amer: 5 mL/min — ABNORMAL LOW (ref >90)
GFR, EST AFRICAN AMERICAN: 6 mL/min — AB (ref >90)
GLUCOSE: 182 mg/dL — AB (ref 70–99)
Potassium: 3.7 mmol/L (ref 3.5–5.1)
Sodium: 139 mmol/L (ref 135–145)

## 2014-08-09 LAB — I-STAT TROPONIN, ED: Troponin i, poc: 0.03 ng/mL (ref 0.00–0.08)

## 2014-08-09 MED ORDER — BENZONATATE 100 MG PO CAPS
100.0000 mg | ORAL_CAPSULE | Freq: Once | ORAL | Status: AC
  Administered 2014-08-09: 100 mg via ORAL
  Filled 2014-08-09: qty 1

## 2014-08-09 MED ORDER — BENZONATATE 100 MG PO CAPS: 100.0000 mg | ORAL_CAPSULE | Freq: Three times a day (TID) | ORAL | Status: DC

## 2014-08-09 MED ORDER — PANTOPRAZOLE SODIUM 20 MG PO TBEC: 20.0000 mg | DELAYED_RELEASE_TABLET | Freq: Every day | ORAL | Status: DC

## 2014-08-09 MED ORDER — ALBUTEROL SULFATE HFA 108 (90 BASE) MCG/ACT IN AERS
2.0000 | INHALATION_SPRAY | Freq: Once | RESPIRATORY_TRACT | Status: AC
  Administered 2014-08-09: 2 via RESPIRATORY_TRACT
  Filled 2014-08-09: qty 6.7

## 2014-08-09 MED ORDER — NITROGLYCERIN 0.4 MG SL SUBL: 0.4000 mg | SUBLINGUAL_TABLET | SUBLINGUAL | Status: DC | PRN

## 2014-08-09 NOTE — ED Notes (Signed)
Pt. reports progressing SOB with chest congestion , productive cough onset yesterday , hemodialysis q Mon/Wed/ Fri , denies fever or chills.

## 2014-08-09 NOTE — ED Notes (Signed)
Pt ambulated in hallway, stats remained above 95%

## 2014-08-09 NOTE — ED Notes (Signed)
Pt to Xray.

## 2014-08-09 NOTE — ED Notes (Signed)
Pt reports midsternal cp and sob x2 days.  Pt reports pain as a pressure.  Also c/o nausea.

## 2014-08-09 NOTE — Discharge Instructions (Signed)
Acute Bronchitis °Bronchitis is inflammation of the airways that extend from the windpipe into the lungs (bronchi). The inflammation often causes mucus to develop. This leads to a cough, which is the most common symptom of bronchitis.  °In acute bronchitis, the condition usually develops suddenly and goes away over time, usually in a couple weeks. Smoking, allergies, and asthma can make bronchitis worse. Repeated episodes of bronchitis may cause further lung problems.  °CAUSES °Acute bronchitis is most often caused by the same virus that causes a cold. The virus can spread from person to person (contagious) through coughing, sneezing, and touching contaminated objects. °SIGNS AND SYMPTOMS  °· Cough.   °· Fever.   °· Coughing up mucus.   °· Body aches.   °· Chest congestion.   °· Chills.   °· Shortness of breath.   °· Sore throat.   °DIAGNOSIS  °Acute bronchitis is usually diagnosed through a physical exam. Your health care provider will also ask you questions about your medical history. Tests, such as chest X-rays, are sometimes done to rule out other conditions.  °TREATMENT  °Acute bronchitis usually goes away in a couple weeks. Oftentimes, no medical treatment is necessary. Medicines are sometimes given for relief of fever or cough. Antibiotic medicines are usually not needed but may be prescribed in certain situations. In some cases, an inhaler may be recommended to help reduce shortness of breath and control the cough. A cool mist vaporizer may also be used to help thin bronchial secretions and make it easier to clear the chest.  °HOME CARE INSTRUCTIONS °· Get plenty of rest.   °· Drink enough fluids to keep your urine clear or pale yellow (unless you have a medical condition that requires fluid restriction). Increasing fluids may help thin your respiratory secretions (sputum) and reduce chest congestion, and it will prevent dehydration.   °· Take medicines only as directed by your health care provider. °· If  you were prescribed an antibiotic medicine, finish it all even if you start to feel better. °· Avoid smoking and secondhand smoke. Exposure to cigarette smoke or irritating chemicals will make bronchitis worse. If you are a smoker, consider using nicotine gum or skin patches to help control withdrawal symptoms. Quitting smoking will help your lungs heal faster.   °· Reduce the chances of another bout of acute bronchitis by washing your hands frequently, avoiding people with cold symptoms, and trying not to touch your hands to your mouth, nose, or eyes.   °· Keep all follow-up visits as directed by your health care provider.   °SEEK MEDICAL CARE IF: °Your symptoms do not improve after 1 week of treatment.  °SEEK IMMEDIATE MEDICAL CARE IF: °· You develop an increased fever or chills.   °· You have chest pain.   °· You have severe shortness of breath. °· You have bloody sputum.   °· You develop dehydration. °· You faint or repeatedly feel like you are going to pass out. °· You develop repeated vomiting. °· You develop a severe headache. °MAKE SURE YOU:  °· Understand these instructions. °· Will watch your condition. °· Will get help right away if you are not doing well or get worse. °Document Released: 08/14/2004 Document Revised: 11/21/2013 Document Reviewed: 12/28/2012 °Exit[employer]® Patient Information ©2015 [employer]. This information is not intended to replace advice given to you by your health care provider. Make sure you discuss any questions you have with your health care provider. °Gastroesophageal Reflux Disease, Adult °Gastroesophageal reflux disease (GERD) happens when acid from your stomach flows up into the esophagus. When acid comes in contact   with the esophagus, the acid causes soreness (inflammation) in the esophagus. Over time, GERD may create small holes (ulcers) in the lining of the esophagus. °CAUSES  °· Increased body weight. This puts pressure on the stomach, making acid rise from the stomach  into the esophagus. °· Smoking. This increases acid production in the stomach. °· Drinking alcohol. This causes decreased pressure in the lower esophageal sphincter (valve or ring of muscle between the esophagus and stomach), allowing acid from the stomach into the esophagus. °· Late evening meals and a full stomach. This increases pressure and acid production in the stomach. °· A malformed lower esophageal sphincter. °Sometimes, no cause is found. °SYMPTOMS  °· Burning pain in the lower part of the mid-chest behind the breastbone and in the mid-stomach area. This may occur twice a week or more often. °· Trouble swallowing. °· Sore throat. °· Dry cough. °· Asthma-like symptoms including chest tightness, shortness of breath, or wheezing. °DIAGNOSIS  °Your caregiver may be able to diagnose GERD based on your symptoms. In some cases, X-rays and other tests may be done to check for complications or to check the condition of your stomach and esophagus. °TREATMENT  °Your caregiver may recommend over-the-counter or prescription medicines to help decrease acid production. Ask your caregiver before starting or adding any new medicines.  °HOME CARE INSTRUCTIONS  °· Change the factors that you can control. Ask your caregiver for guidance concerning weight loss, quitting smoking, and alcohol consumption. °· Avoid foods and drinks that make your symptoms worse, such as: °¨ Caffeine or alcoholic drinks. °¨ Chocolate. °¨ Peppermint or mint flavorings. °¨ Garlic and onions. °¨ Spicy foods. °¨ Citrus fruits, such as oranges, lemons, or limes. °¨ Tomato-based foods such as sauce, chili, salsa, and pizza. °¨ Fried and fatty foods. °· Avoid lying down for the 3 hours prior to your bedtime or prior to taking a nap. °· Eat small, frequent meals instead of large meals. °· Wear loose-fitting clothing. Do not wear anything tight around your waist that causes pressure on your stomach. °· Raise the head of your bed 6 to 8 inches with wood  blocks to help you sleep. Extra pillows will not help. °· Only take over-the-counter or prescription medicines for pain, discomfort, or fever as directed by your caregiver. °· Do not take aspirin, ibuprofen, or other nonsteroidal anti-inflammatory drugs (NSAIDs). °SEEK IMMEDIATE MEDICAL CARE IF:  °· You have pain in your arms, neck, jaw, teeth, or back. °· Your pain increases or changes in intensity or duration. °· You develop nausea, vomiting, or sweating (diaphoresis). °· You develop shortness of breath, or you faint. °· Your vomit is green, yellow, black, or looks like coffee grounds or blood. °· Your stool is red, bloody, or black. °These symptoms could be signs of other problems, such as heart disease, gastric bleeding, or esophageal bleeding. °MAKE SURE YOU:  °· Understand these instructions. °· Will watch your condition. °· Will get help right away if you are not doing well or get worse. °Document Released: 04/16/2005 Document Revised: 09/29/2011 Document Reviewed: 01/24/2011 °[employer]® Patient Information ©2015 [employer]. This information is not intended to replace advice given to you by your health care provider. Make sure you discuss any questions you have with your health care provider. ° °

## 2014-08-09 NOTE — ED Provider Notes (Signed)
CSN: [id number]     Arrival date & time 08/09/14  [id number] History  This chart was scribed for [name], MD by Delphia Grates, ED Scribe. This patient was seen in room A08C/A08C and the patient's care was started at 1:26 AM.   Chief Complaint  Patient presents with  . Shortness of Breath  . Cough    Patient is a 64 y.o. female presenting with shortness of breath and cough. The history is provided by the patient. No language interpreter was used.  Shortness of Breath Severity:  Moderate Duration:  4 days Timing:  Constant Progression:  Worsening Chronicity:  New Relieved by:  None tried Worsened by:  Nothing tried Ineffective treatments:  None tried Associated symptoms: cough   Associated symptoms: no abdominal pain, no chest pain, no diaphoresis, no fever, no headaches, no neck pain, no sore throat and no vomiting   Cough Cough characteristics:  Productive Sputum characteristics:  Clear Associated symptoms: shortness of breath   Associated symptoms: no chest pain, no chills, no diaphoresis, no eye discharge, no fever, no headaches, no rhinorrhea and no sore throat      HPI Comments: [name] is a 64 y.o. female, with history of HTN, GERD, and DM, who presents to the Emergency Department complaining of shortness of breath for the past 3-4 days, that has gotten worse throughout the day. Patient also feels as if she has indigestion. There is associated productive cough of clear sputum, congestion, and nausea. She reports SOB is worse when ambulating. Patient is a dialysis patient and is dialyzed every Monday, Wednesday, and Friday. She reports her last full dialysis was 2 days ago without any complications. She denies fever, chills, or vomiting.   Past Medical History  Diagnosis Date  . Hypertension   . GERD (gastroesophageal reflux disease)   . Anemia   . Myalgia 12/31/2011  . Neuropathy 12/31/2011  . Shortness of breath     "lately it's been all the time" (06/15/2013)   . Type II diabetes mellitus   . Arthritis     "joints" (06/15/2013)  . Breast cancer     left  . Anxiety   . ESRD (end stage renal disease)     "suppose to start dialysis today" (06/15/2013)   Past Surgical History  Procedure Laterality Date  . Cesarean section  1980  . Tonsillectomy    . Av fistula placement Right 06/06/2013    Procedure: ARTERIOVENOUS (AV) FISTULA CREATION-RIGHT BRACHIAL CEPHALIC;  Surgeon: Conrad [place], MD;  Location: [facility];  Service: Vascular;  Laterality: Right;  . Removal of a dialysis catheter Right 06/06/2013    Procedure: REMOVAL OF RIGHT MEDIPORT;  Surgeon: Conrad [place], MD;  Location: [facility];  Service: Vascular;  Laterality: Right;  . Cataract extraction w/ anterior vitrectomy Bilateral   . Refractive surgery Bilateral   . Eye surgery Bilateral     laser surgery  . Abdominal hysterectomy      partial  . Breast biopsy Left   . Breast lumpectomy Left     "and took out some lymph nodes" (06/15/2013)   Family History  Problem Relation Age of Onset  . Diabetes Mother   . Hyperlipidemia Mother   . Hypertension Mother   . Hypertension Father   . Diabetes Sister   . Diabetes Brother   . Hypertension Brother   . Heart attack Brother    History  Substance Use Topics  . Smoking status: Never Smoker   . Smokeless  tobacco: Never Used  . Alcohol Use: No   OB History    No data available     Review of Systems  Constitutional: Negative for fever, chills, diaphoresis, activity change, appetite change and fatigue.  HENT: Positive for congestion. Negative for facial swelling, rhinorrhea and sore throat.   Eyes: Negative for photophobia and discharge.  Respiratory: Positive for cough and shortness of breath. Negative for chest tightness.   Cardiovascular: Negative for chest pain, palpitations and leg swelling.  Gastrointestinal: Positive for nausea. Negative for vomiting, abdominal pain and diarrhea.  Endocrine: Negative for polydipsia and polyuria.   Genitourinary: Negative for dysuria, frequency, difficulty urinating and pelvic pain.  Musculoskeletal: Negative for back pain, arthralgias, neck pain and neck stiffness.  Skin: Negative for color change and wound.  Allergic/Immunologic: Negative for immunocompromised state.  Neurological: Negative for facial asymmetry, weakness, numbness and headaches.  Hematological: Does not bruise/bleed easily.  Psychiatric/Behavioral: Negative for confusion and agitation.      Allergies  Review of patient's allergies indicates no known allergies.  Home Medications   Prior to Admission medications   Medication Sig Start Date End Date Taking? Authorizing Provider  acetaminophen (TYLENOL) 325 MG tablet Take 650 mg by mouth every 6 (six) hours as needed for mild pain.     Historical Provider, MD  amLODipine (NORVASC) 10 MG tablet Take 10 mg by mouth daily.    Historical Provider, MD  aspirin 81 MG tablet Take 81 mg by mouth daily.      Historical Provider, MD  carvedilol (COREG) 12.5 MG tablet Take 12.5 mg by mouth 2 (two) times daily with a meal.    Historical Provider, MD  carvedilol (COREG) 6.25 MG tablet Take 1 tablet (6.25 mg total) by mouth 2 (two) times daily. Patient not taking: Reported on 07/28/2014 10/12/13   Thayer Headings, MD  cloNIDine (CATAPRES) 0.1 MG tablet Take 0.1 mg by mouth 2 (two) times daily.    Historical Provider, MD  cyclobenzaprine (FLEXERIL) 5 MG tablet Take 1 tablet (5 mg total) by mouth 2 (two) times daily as needed for muscle spasms. Patient not taking: Reported on 08/09/2014 01/08/14   Thao P Le, DO  darbepoetin (ARANESP) 200 MCG/0.4ML SOLN injection Inject 0.4 mLs (200 mcg total) into the vein every Wednesday with hemodialysis. Patient not taking: Reported on 07/28/2014 06/22/13   Geradine Girt, DO  doxercalciferol (HECTOROL) 4 MCG/2ML injection Inject 0.5 mLs (1 mcg total) into the vein every Monday, Wednesday, and Friday with hemodialysis. Patient not taking: Reported on  07/28/2014 06/18/13   Geradine Girt, DO  insulin lispro (HUMALOG KWIKPEN) 100 UNIT/ML SOPN Inject 12-16 Units into the skin 2 (two) times daily. SSI    Historical Provider, MD  lanthanum (FOSRENOL) 1000 MG chewable tablet Chew 1,000 mg by mouth 2 (two) times daily with a meal.    Historical Provider, MD  lisinopril (PRINIVIL,ZESTRIL) 40 MG tablet Take 40 mg by mouth daily.    Historical Provider, MD  multivitamin (RENA-VIT) TABS tablet Take 1 tablet by mouth daily.    Historical Provider, MD  naproxen (NAPROSYN) 250 MG tablet Take 250 mg by mouth 2 (two) times daily as needed for mild pain.    Historical Provider, MD  omeprazole (PRILOSEC) 20 MG capsule Take 20 mg by mouth daily.    Historical Provider, MD  ondansetron (ZOFRAN) 4 MG tablet Take 4 mg by mouth every 8 (eight) hours as needed for nausea or vomiting.    Historical Provider, MD  OVER THE COUNTER MEDICATION Take 2 capsules by mouth daily. Stool Softner    Historical Provider, MD  traMADol (ULTRAM) 50 MG tablet Take 50 mg by mouth every 6 (six) hours as needed for moderate pain.     Historical Provider, MD   Triage Vitals: BP 195/96 mmHg  Pulse 91  Temp(Src) 98.2 F (36.8 C) (Oral)  Resp 14  Ht 5\' 5"  (1.651 m)  Wt 192 lb (87.091 kg)  BMI 31.95 kg/m2  SpO2 98%  Physical Exam  Constitutional: She is oriented to person, place, and time. She appears well-developed and well-nourished. No distress.  HENT:  Head: Normocephalic.  Mouth/Throat: Oropharynx is clear and moist.  Eyes: Pupils are equal, round, and reactive to light.  Neck: Neck supple.  Cardiovascular: Normal rate, regular rhythm and normal heart sounds.   Pulmonary/Chest: Effort normal and breath sounds normal. No respiratory distress. She has no wheezes.  Abdominal: Soft. She exhibits no distension. There is no tenderness. There is no rebound and no guarding.  Musculoskeletal: She exhibits edema. She exhibits no tenderness.  1+ pitting edema bilaterally.  Neurological:  She is alert and oriented to person, place, and time.  Skin: Skin is warm and dry.  Psychiatric: She has a normal mood and affect.  Nursing note and vitals reviewed.   ED Course  Procedures (including critical care time)  DIAGNOSTIC STUDIES: Oxygen Saturation is 98% on room air, normal by my interpretation.    COORDINATION OF CARE: At 0134 Discussed treatment plan with patient which includes breathing treatment. Patient agrees.   Labs Review Labs Reviewed  CBC WITH DIFFERENTIAL - Abnormal; Notable for the following:    RBC 3.48 (*)    Hemoglobin 10.4 (*)    HCT 31.7 (*)    RDW 15.9 (*)    All other components within normal limits  BASIC METABOLIC PANEL - Abnormal; Notable for the following:    Glucose, Bld 182 (*)    BUN 37 (*)    Creatinine, Ser 7.77 (*)    GFR calc non Af Amer 5 (*)    GFR calc Af Amer 6 (*)    All other components within normal limits  I-STAT TROPOININ, ED    Imaging Review Dg Chest 2 View  08/09/2014   CLINICAL DATA:  Cough, SOB, epigastric pain and nausea.Hx left breast cancer, diabetes  Pt was in hospital last week for fluid in lungs  EXAM: CHEST  2 VIEW  COMPARISON:  07/28/2014.  FINDINGS: Similar to the prior study there is interstitial thickening hand mild cardiomegaly, findings consistent with congestive heart failure and interstitial edema. No lung consolidation to suggest pneumonia. No pleural effusion. No mediastinal or hilar masses. Bony thorax is demineralized.  IMPRESSION: Findings consistent with CHF interstitial edema similar to the prior exam.   Electronically Signed   By: Lajean Manes M.D.   On: 08/09/2014 01:15     EKG Interpretation   Date/Time:  Wednesday August 09 2014 00:52:53 EST Ventricular Rate:  96 PR Interval:  144 QRS Duration: 100 QT Interval:  370 QTC Calculation: 467 R Axis:   43 Text Interpretation:  Normal sinus rhythm Left ventricular hypertrophy  with repolarization abnormality Abnormal ECG No significant change  since  last tracing Confirmed by Kenyon ([id number]) on 08/09/2014 1:01:39  AM      MDM   Final diagnoses:  SOB (shortness of breath)  Cough    Pt is a 64 y.o. female with Pmhx as above who presents  with to 3 days of cough, nasal and chest congestion congestion, midsternal chest pressure.  Cough is productive of clear sputum.  She denies fevers or chills.  She had full hemodialysis treatment 2 days ago and is due for dialysis treatment tomorrow.  On physical exam she is afebrile and well-appearing.  Lungs are clear, though she has frequent wet sounding cough.  Minimal lower extremity edema.  EKG with no significant change.  Troponin negative.  Chest x-ray unchanged CHF and interstitial edema.  Physical exam is not consistent with acute volume overload.  Potassium is normal.  Do not feel she requires emergent dialysis think symptoms are likely due to acute viral bronchitis.  She felt somewhat improved after albuterol inhaler.  She was able to ambulate with stable pulse oximetry.  I feel she is safe to be discharged home with plan for dialysis in several hours.     [name] evaluation in the Emergency Department is complete. It has been determined that no acute conditions requiring further emergency intervention are present at this time. The patient/guardian have been advised of the diagnosis and plan. We have discussed signs and symptoms that warrant return to the ED, such as changes or worsening in symptoms, worsening pain, shortness breath, fever    I personally performed the services described in this documentation, which was scribed in my presence. The recorded information has been reviewed and is accurate.     [name], MD 08/11/14 2051

## 2014-08-11 DIAGNOSIS — N2581 Secondary hyperparathyroidism of renal origin: Secondary | ICD-10-CM | POA: Diagnosis not present

## 2014-08-11 DIAGNOSIS — N186 End stage renal disease: Secondary | ICD-10-CM | POA: Diagnosis not present

## 2014-08-11 DIAGNOSIS — D631 Anemia in chronic kidney disease: Secondary | ICD-10-CM | POA: Diagnosis not present

## 2014-08-11 DIAGNOSIS — D509 Iron deficiency anemia, unspecified: Secondary | ICD-10-CM | POA: Diagnosis not present

## 2014-08-14 DIAGNOSIS — N2581 Secondary hyperparathyroidism of renal origin: Secondary | ICD-10-CM | POA: Diagnosis not present

## 2014-08-14 DIAGNOSIS — N186 End stage renal disease: Secondary | ICD-10-CM | POA: Diagnosis not present

## 2014-08-14 DIAGNOSIS — D631 Anemia in chronic kidney disease: Secondary | ICD-10-CM | POA: Diagnosis not present

## 2014-08-14 DIAGNOSIS — D509 Iron deficiency anemia, unspecified: Secondary | ICD-10-CM | POA: Diagnosis not present

## 2014-08-15 DIAGNOSIS — H3500 Unspecified background retinopathy: Secondary | ICD-10-CM | POA: Diagnosis not present

## 2014-08-15 DIAGNOSIS — I10 Essential (primary) hypertension: Secondary | ICD-10-CM | POA: Diagnosis not present

## 2014-08-15 DIAGNOSIS — R252 Cramp and spasm: Secondary | ICD-10-CM | POA: Diagnosis not present

## 2014-08-15 DIAGNOSIS — Z1389 Encounter for screening for other disorder: Secondary | ICD-10-CM | POA: Diagnosis not present

## 2014-08-15 DIAGNOSIS — Z6832 Body mass index (BMI) 32.0-32.9, adult: Secondary | ICD-10-CM | POA: Diagnosis not present

## 2014-08-15 DIAGNOSIS — N186 End stage renal disease: Secondary | ICD-10-CM | POA: Diagnosis not present

## 2014-08-15 DIAGNOSIS — F39 Unspecified mood [affective] disorder: Secondary | ICD-10-CM | POA: Diagnosis not present

## 2014-08-15 DIAGNOSIS — E1129 Type 2 diabetes mellitus with other diabetic kidney complication: Secondary | ICD-10-CM | POA: Diagnosis not present

## 2014-08-16 DIAGNOSIS — D631 Anemia in chronic kidney disease: Secondary | ICD-10-CM | POA: Diagnosis not present

## 2014-08-16 DIAGNOSIS — D509 Iron deficiency anemia, unspecified: Secondary | ICD-10-CM | POA: Diagnosis not present

## 2014-08-16 DIAGNOSIS — N2581 Secondary hyperparathyroidism of renal origin: Secondary | ICD-10-CM | POA: Diagnosis not present

## 2014-08-16 DIAGNOSIS — N186 End stage renal disease: Secondary | ICD-10-CM | POA: Diagnosis not present

## 2014-08-18 DIAGNOSIS — N186 End stage renal disease: Secondary | ICD-10-CM | POA: Diagnosis not present

## 2014-08-18 DIAGNOSIS — D631 Anemia in chronic kidney disease: Secondary | ICD-10-CM | POA: Diagnosis not present

## 2014-08-18 DIAGNOSIS — D509 Iron deficiency anemia, unspecified: Secondary | ICD-10-CM | POA: Diagnosis not present

## 2014-08-18 DIAGNOSIS — N2581 Secondary hyperparathyroidism of renal origin: Secondary | ICD-10-CM | POA: Diagnosis not present

## 2014-08-20 DIAGNOSIS — N186 End stage renal disease: Secondary | ICD-10-CM | POA: Diagnosis not present

## 2014-08-20 DIAGNOSIS — Z992 Dependence on renal dialysis: Secondary | ICD-10-CM | POA: Diagnosis not present

## 2014-08-21 DIAGNOSIS — Z23 Encounter for immunization: Secondary | ICD-10-CM | POA: Diagnosis not present

## 2014-08-21 DIAGNOSIS — N186 End stage renal disease: Secondary | ICD-10-CM | POA: Diagnosis not present

## 2014-08-21 DIAGNOSIS — D631 Anemia in chronic kidney disease: Secondary | ICD-10-CM | POA: Diagnosis not present

## 2014-08-21 DIAGNOSIS — D509 Iron deficiency anemia, unspecified: Secondary | ICD-10-CM | POA: Diagnosis not present

## 2014-08-21 DIAGNOSIS — N2581 Secondary hyperparathyroidism of renal origin: Secondary | ICD-10-CM | POA: Diagnosis not present

## 2014-08-23 DIAGNOSIS — Z23 Encounter for immunization: Secondary | ICD-10-CM | POA: Diagnosis not present

## 2014-08-23 DIAGNOSIS — D631 Anemia in chronic kidney disease: Secondary | ICD-10-CM | POA: Diagnosis not present

## 2014-08-23 DIAGNOSIS — N186 End stage renal disease: Secondary | ICD-10-CM | POA: Diagnosis not present

## 2014-08-23 DIAGNOSIS — N2581 Secondary hyperparathyroidism of renal origin: Secondary | ICD-10-CM | POA: Diagnosis not present

## 2014-08-23 DIAGNOSIS — D509 Iron deficiency anemia, unspecified: Secondary | ICD-10-CM | POA: Diagnosis not present

## 2014-08-25 DIAGNOSIS — Z23 Encounter for immunization: Secondary | ICD-10-CM | POA: Diagnosis not present

## 2014-08-25 DIAGNOSIS — D509 Iron deficiency anemia, unspecified: Secondary | ICD-10-CM | POA: Diagnosis not present

## 2014-08-25 DIAGNOSIS — D631 Anemia in chronic kidney disease: Secondary | ICD-10-CM | POA: Diagnosis not present

## 2014-08-25 DIAGNOSIS — N2581 Secondary hyperparathyroidism of renal origin: Secondary | ICD-10-CM | POA: Diagnosis not present

## 2014-08-25 DIAGNOSIS — N186 End stage renal disease: Secondary | ICD-10-CM | POA: Diagnosis not present

## 2014-08-28 DIAGNOSIS — N2581 Secondary hyperparathyroidism of renal origin: Secondary | ICD-10-CM | POA: Diagnosis not present

## 2014-08-28 DIAGNOSIS — Z23 Encounter for immunization: Secondary | ICD-10-CM | POA: Diagnosis not present

## 2014-08-28 DIAGNOSIS — N186 End stage renal disease: Secondary | ICD-10-CM | POA: Diagnosis not present

## 2014-08-28 DIAGNOSIS — D509 Iron deficiency anemia, unspecified: Secondary | ICD-10-CM | POA: Diagnosis not present

## 2014-08-28 DIAGNOSIS — D631 Anemia in chronic kidney disease: Secondary | ICD-10-CM | POA: Diagnosis not present

## 2014-08-29 DIAGNOSIS — H35051 Retinal neovascularization, unspecified, right eye: Secondary | ICD-10-CM | POA: Diagnosis not present

## 2014-08-29 DIAGNOSIS — H3532 Exudative age-related macular degeneration: Secondary | ICD-10-CM | POA: Diagnosis not present

## 2014-08-30 DIAGNOSIS — D509 Iron deficiency anemia, unspecified: Secondary | ICD-10-CM | POA: Diagnosis not present

## 2014-08-30 DIAGNOSIS — D631 Anemia in chronic kidney disease: Secondary | ICD-10-CM | POA: Diagnosis not present

## 2014-08-30 DIAGNOSIS — Z23 Encounter for immunization: Secondary | ICD-10-CM | POA: Diagnosis not present

## 2014-08-30 DIAGNOSIS — N2581 Secondary hyperparathyroidism of renal origin: Secondary | ICD-10-CM | POA: Diagnosis not present

## 2014-08-30 DIAGNOSIS — N186 End stage renal disease: Secondary | ICD-10-CM | POA: Diagnosis not present

## 2014-09-01 DIAGNOSIS — N186 End stage renal disease: Secondary | ICD-10-CM | POA: Diagnosis not present

## 2014-09-01 DIAGNOSIS — D509 Iron deficiency anemia, unspecified: Secondary | ICD-10-CM | POA: Diagnosis not present

## 2014-09-01 DIAGNOSIS — N2581 Secondary hyperparathyroidism of renal origin: Secondary | ICD-10-CM | POA: Diagnosis not present

## 2014-09-01 DIAGNOSIS — D631 Anemia in chronic kidney disease: Secondary | ICD-10-CM | POA: Diagnosis not present

## 2014-09-01 DIAGNOSIS — Z23 Encounter for immunization: Secondary | ICD-10-CM | POA: Diagnosis not present

## 2014-09-04 DIAGNOSIS — Z23 Encounter for immunization: Secondary | ICD-10-CM | POA: Diagnosis not present

## 2014-09-04 DIAGNOSIS — N186 End stage renal disease: Secondary | ICD-10-CM | POA: Diagnosis not present

## 2014-09-04 DIAGNOSIS — D509 Iron deficiency anemia, unspecified: Secondary | ICD-10-CM | POA: Diagnosis not present

## 2014-09-04 DIAGNOSIS — D631 Anemia in chronic kidney disease: Secondary | ICD-10-CM | POA: Diagnosis not present

## 2014-09-04 DIAGNOSIS — N2581 Secondary hyperparathyroidism of renal origin: Secondary | ICD-10-CM | POA: Diagnosis not present

## 2014-09-06 DIAGNOSIS — D509 Iron deficiency anemia, unspecified: Secondary | ICD-10-CM | POA: Diagnosis not present

## 2014-09-06 DIAGNOSIS — D631 Anemia in chronic kidney disease: Secondary | ICD-10-CM | POA: Diagnosis not present

## 2014-09-06 DIAGNOSIS — Z23 Encounter for immunization: Secondary | ICD-10-CM | POA: Diagnosis not present

## 2014-09-06 DIAGNOSIS — N2581 Secondary hyperparathyroidism of renal origin: Secondary | ICD-10-CM | POA: Diagnosis not present

## 2014-09-06 DIAGNOSIS — N186 End stage renal disease: Secondary | ICD-10-CM | POA: Diagnosis not present

## 2014-09-08 DIAGNOSIS — N2581 Secondary hyperparathyroidism of renal origin: Secondary | ICD-10-CM | POA: Diagnosis not present

## 2014-09-08 DIAGNOSIS — Z23 Encounter for immunization: Secondary | ICD-10-CM | POA: Diagnosis not present

## 2014-09-08 DIAGNOSIS — N186 End stage renal disease: Secondary | ICD-10-CM | POA: Diagnosis not present

## 2014-09-08 DIAGNOSIS — D509 Iron deficiency anemia, unspecified: Secondary | ICD-10-CM | POA: Diagnosis not present

## 2014-09-08 DIAGNOSIS — D631 Anemia in chronic kidney disease: Secondary | ICD-10-CM | POA: Diagnosis not present

## 2014-09-11 DIAGNOSIS — N2581 Secondary hyperparathyroidism of renal origin: Secondary | ICD-10-CM | POA: Diagnosis not present

## 2014-09-11 DIAGNOSIS — Z23 Encounter for immunization: Secondary | ICD-10-CM | POA: Diagnosis not present

## 2014-09-11 DIAGNOSIS — D509 Iron deficiency anemia, unspecified: Secondary | ICD-10-CM | POA: Diagnosis not present

## 2014-09-11 DIAGNOSIS — D631 Anemia in chronic kidney disease: Secondary | ICD-10-CM | POA: Diagnosis not present

## 2014-09-11 DIAGNOSIS — N186 End stage renal disease: Secondary | ICD-10-CM | POA: Diagnosis not present

## 2014-09-13 DIAGNOSIS — N2581 Secondary hyperparathyroidism of renal origin: Secondary | ICD-10-CM | POA: Diagnosis not present

## 2014-09-13 DIAGNOSIS — N186 End stage renal disease: Secondary | ICD-10-CM | POA: Diagnosis not present

## 2014-09-13 DIAGNOSIS — Z23 Encounter for immunization: Secondary | ICD-10-CM | POA: Diagnosis not present

## 2014-09-13 DIAGNOSIS — D631 Anemia in chronic kidney disease: Secondary | ICD-10-CM | POA: Diagnosis not present

## 2014-09-13 DIAGNOSIS — D509 Iron deficiency anemia, unspecified: Secondary | ICD-10-CM | POA: Diagnosis not present

## 2014-09-15 DIAGNOSIS — Z23 Encounter for immunization: Secondary | ICD-10-CM | POA: Diagnosis not present

## 2014-09-15 DIAGNOSIS — N186 End stage renal disease: Secondary | ICD-10-CM | POA: Diagnosis not present

## 2014-09-15 DIAGNOSIS — D509 Iron deficiency anemia, unspecified: Secondary | ICD-10-CM | POA: Diagnosis not present

## 2014-09-15 DIAGNOSIS — D631 Anemia in chronic kidney disease: Secondary | ICD-10-CM | POA: Diagnosis not present

## 2014-09-15 DIAGNOSIS — N2581 Secondary hyperparathyroidism of renal origin: Secondary | ICD-10-CM | POA: Diagnosis not present

## 2014-09-18 DIAGNOSIS — Z992 Dependence on renal dialysis: Secondary | ICD-10-CM | POA: Diagnosis not present

## 2014-09-18 DIAGNOSIS — D509 Iron deficiency anemia, unspecified: Secondary | ICD-10-CM | POA: Diagnosis not present

## 2014-09-18 DIAGNOSIS — N186 End stage renal disease: Secondary | ICD-10-CM | POA: Diagnosis not present

## 2014-09-18 DIAGNOSIS — N2581 Secondary hyperparathyroidism of renal origin: Secondary | ICD-10-CM | POA: Diagnosis not present

## 2014-09-18 DIAGNOSIS — D631 Anemia in chronic kidney disease: Secondary | ICD-10-CM | POA: Diagnosis not present

## 2014-09-18 DIAGNOSIS — Z23 Encounter for immunization: Secondary | ICD-10-CM | POA: Diagnosis not present

## 2014-09-20 DIAGNOSIS — N186 End stage renal disease: Secondary | ICD-10-CM | POA: Diagnosis not present

## 2014-09-20 DIAGNOSIS — D631 Anemia in chronic kidney disease: Secondary | ICD-10-CM | POA: Diagnosis not present

## 2014-09-20 DIAGNOSIS — N2581 Secondary hyperparathyroidism of renal origin: Secondary | ICD-10-CM | POA: Diagnosis not present

## 2014-09-22 DIAGNOSIS — N186 End stage renal disease: Secondary | ICD-10-CM | POA: Diagnosis not present

## 2014-09-22 DIAGNOSIS — D631 Anemia in chronic kidney disease: Secondary | ICD-10-CM | POA: Diagnosis not present

## 2014-09-22 DIAGNOSIS — N2581 Secondary hyperparathyroidism of renal origin: Secondary | ICD-10-CM | POA: Diagnosis not present

## 2014-09-25 DIAGNOSIS — N186 End stage renal disease: Secondary | ICD-10-CM | POA: Diagnosis not present

## 2014-09-25 DIAGNOSIS — N2581 Secondary hyperparathyroidism of renal origin: Secondary | ICD-10-CM | POA: Diagnosis not present

## 2014-09-25 DIAGNOSIS — D631 Anemia in chronic kidney disease: Secondary | ICD-10-CM | POA: Diagnosis not present

## 2014-09-27 DIAGNOSIS — D631 Anemia in chronic kidney disease: Secondary | ICD-10-CM | POA: Diagnosis not present

## 2014-09-27 DIAGNOSIS — N186 End stage renal disease: Secondary | ICD-10-CM | POA: Diagnosis not present

## 2014-09-27 DIAGNOSIS — N2581 Secondary hyperparathyroidism of renal origin: Secondary | ICD-10-CM | POA: Diagnosis not present

## 2014-09-29 DIAGNOSIS — N2581 Secondary hyperparathyroidism of renal origin: Secondary | ICD-10-CM | POA: Diagnosis not present

## 2014-09-29 DIAGNOSIS — N186 End stage renal disease: Secondary | ICD-10-CM | POA: Diagnosis not present

## 2014-09-29 DIAGNOSIS — D631 Anemia in chronic kidney disease: Secondary | ICD-10-CM | POA: Diagnosis not present

## 2014-10-02 DIAGNOSIS — N2581 Secondary hyperparathyroidism of renal origin: Secondary | ICD-10-CM | POA: Diagnosis not present

## 2014-10-02 DIAGNOSIS — N186 End stage renal disease: Secondary | ICD-10-CM | POA: Diagnosis not present

## 2014-10-02 DIAGNOSIS — D631 Anemia in chronic kidney disease: Secondary | ICD-10-CM | POA: Diagnosis not present

## 2014-10-03 DIAGNOSIS — H3532 Exudative age-related macular degeneration: Secondary | ICD-10-CM | POA: Diagnosis not present

## 2014-10-04 DIAGNOSIS — N2581 Secondary hyperparathyroidism of renal origin: Secondary | ICD-10-CM | POA: Diagnosis not present

## 2014-10-04 DIAGNOSIS — D631 Anemia in chronic kidney disease: Secondary | ICD-10-CM | POA: Diagnosis not present

## 2014-10-04 DIAGNOSIS — N186 End stage renal disease: Secondary | ICD-10-CM | POA: Diagnosis not present

## 2014-10-06 DIAGNOSIS — N186 End stage renal disease: Secondary | ICD-10-CM | POA: Diagnosis not present

## 2014-10-06 DIAGNOSIS — N2581 Secondary hyperparathyroidism of renal origin: Secondary | ICD-10-CM | POA: Diagnosis not present

## 2014-10-06 DIAGNOSIS — D631 Anemia in chronic kidney disease: Secondary | ICD-10-CM | POA: Diagnosis not present

## 2014-10-09 DIAGNOSIS — N186 End stage renal disease: Secondary | ICD-10-CM | POA: Diagnosis not present

## 2014-10-09 DIAGNOSIS — D631 Anemia in chronic kidney disease: Secondary | ICD-10-CM | POA: Diagnosis not present

## 2014-10-09 DIAGNOSIS — N2581 Secondary hyperparathyroidism of renal origin: Secondary | ICD-10-CM | POA: Diagnosis not present

## 2014-10-11 DIAGNOSIS — N2581 Secondary hyperparathyroidism of renal origin: Secondary | ICD-10-CM | POA: Diagnosis not present

## 2014-10-11 DIAGNOSIS — N186 End stage renal disease: Secondary | ICD-10-CM | POA: Diagnosis not present

## 2014-10-11 DIAGNOSIS — D631 Anemia in chronic kidney disease: Secondary | ICD-10-CM | POA: Diagnosis not present

## 2014-10-13 DIAGNOSIS — D631 Anemia in chronic kidney disease: Secondary | ICD-10-CM | POA: Diagnosis not present

## 2014-10-13 DIAGNOSIS — N2581 Secondary hyperparathyroidism of renal origin: Secondary | ICD-10-CM | POA: Diagnosis not present

## 2014-10-13 DIAGNOSIS — N186 End stage renal disease: Secondary | ICD-10-CM | POA: Diagnosis not present

## 2014-10-16 DIAGNOSIS — N2581 Secondary hyperparathyroidism of renal origin: Secondary | ICD-10-CM | POA: Diagnosis not present

## 2014-10-16 DIAGNOSIS — D631 Anemia in chronic kidney disease: Secondary | ICD-10-CM | POA: Diagnosis not present

## 2014-10-16 DIAGNOSIS — N186 End stage renal disease: Secondary | ICD-10-CM | POA: Diagnosis not present

## 2014-10-18 DIAGNOSIS — N2581 Secondary hyperparathyroidism of renal origin: Secondary | ICD-10-CM | POA: Diagnosis not present

## 2014-10-18 DIAGNOSIS — N186 End stage renal disease: Secondary | ICD-10-CM | POA: Diagnosis not present

## 2014-10-18 DIAGNOSIS — D631 Anemia in chronic kidney disease: Secondary | ICD-10-CM | POA: Diagnosis not present

## 2014-10-19 DIAGNOSIS — N186 End stage renal disease: Secondary | ICD-10-CM | POA: Diagnosis not present

## 2014-10-19 DIAGNOSIS — E1129 Type 2 diabetes mellitus with other diabetic kidney complication: Secondary | ICD-10-CM | POA: Diagnosis not present

## 2014-10-19 DIAGNOSIS — Z992 Dependence on renal dialysis: Secondary | ICD-10-CM | POA: Diagnosis not present

## 2014-10-20 DIAGNOSIS — N186 End stage renal disease: Secondary | ICD-10-CM | POA: Diagnosis not present

## 2014-10-20 DIAGNOSIS — D631 Anemia in chronic kidney disease: Secondary | ICD-10-CM | POA: Diagnosis not present

## 2014-10-20 DIAGNOSIS — N2581 Secondary hyperparathyroidism of renal origin: Secondary | ICD-10-CM | POA: Diagnosis not present

## 2014-10-23 DIAGNOSIS — N2581 Secondary hyperparathyroidism of renal origin: Secondary | ICD-10-CM | POA: Diagnosis not present

## 2014-10-23 DIAGNOSIS — D631 Anemia in chronic kidney disease: Secondary | ICD-10-CM | POA: Diagnosis not present

## 2014-10-23 DIAGNOSIS — N186 End stage renal disease: Secondary | ICD-10-CM | POA: Diagnosis not present

## 2014-10-25 DIAGNOSIS — N186 End stage renal disease: Secondary | ICD-10-CM | POA: Diagnosis not present

## 2014-10-25 DIAGNOSIS — N2581 Secondary hyperparathyroidism of renal origin: Secondary | ICD-10-CM | POA: Diagnosis not present

## 2014-10-25 DIAGNOSIS — D631 Anemia in chronic kidney disease: Secondary | ICD-10-CM | POA: Diagnosis not present

## 2014-10-27 DIAGNOSIS — N2581 Secondary hyperparathyroidism of renal origin: Secondary | ICD-10-CM | POA: Diagnosis not present

## 2014-10-27 DIAGNOSIS — N186 End stage renal disease: Secondary | ICD-10-CM | POA: Diagnosis not present

## 2014-10-27 DIAGNOSIS — D631 Anemia in chronic kidney disease: Secondary | ICD-10-CM | POA: Diagnosis not present

## 2014-10-30 ENCOUNTER — Encounter: Payer: Self-pay | Admitting: Internal Medicine

## 2014-10-30 DIAGNOSIS — N186 End stage renal disease: Secondary | ICD-10-CM | POA: Diagnosis not present

## 2014-10-30 DIAGNOSIS — D631 Anemia in chronic kidney disease: Secondary | ICD-10-CM | POA: Diagnosis not present

## 2014-10-30 DIAGNOSIS — N2581 Secondary hyperparathyroidism of renal origin: Secondary | ICD-10-CM | POA: Diagnosis not present

## 2014-11-01 ENCOUNTER — Encounter: Payer: Self-pay | Admitting: Internal Medicine

## 2014-11-01 ENCOUNTER — Ambulatory Visit (INDEPENDENT_AMBULATORY_CARE_PROVIDER_SITE_OTHER): Payer: Medicare Other | Admitting: Internal Medicine

## 2014-11-01 VITALS — BP 126/60 | HR 76 | Ht 63.5 in | Wt 195.4 lb

## 2014-11-01 DIAGNOSIS — R10816 Epigastric abdominal tenderness: Secondary | ICD-10-CM | POA: Diagnosis not present

## 2014-11-01 DIAGNOSIS — R1033 Periumbilical pain: Secondary | ICD-10-CM | POA: Diagnosis not present

## 2014-11-01 DIAGNOSIS — R1013 Epigastric pain: Secondary | ICD-10-CM | POA: Diagnosis not present

## 2014-11-01 DIAGNOSIS — N186 End stage renal disease: Secondary | ICD-10-CM | POA: Diagnosis not present

## 2014-11-01 DIAGNOSIS — D631 Anemia in chronic kidney disease: Secondary | ICD-10-CM | POA: Diagnosis not present

## 2014-11-01 DIAGNOSIS — N2581 Secondary hyperparathyroidism of renal origin: Secondary | ICD-10-CM | POA: Diagnosis not present

## 2014-11-01 MED ORDER — PANTOPRAZOLE SODIUM 40 MG PO TBEC
40.0000 mg | DELAYED_RELEASE_TABLET | Freq: Every day | ORAL | Status: DC
Start: 2014-11-01 — End: 2015-01-09

## 2014-11-01 NOTE — Patient Instructions (Signed)
You have been scheduled for an endoscopy. Please follow written instructions given to you at your visit today. If you use inhalers (even only as needed), please bring them with you on the day of your procedure.   We have sent the following medications to your pharmacy for you to pick up at your convenience: Pantoprazole     I appreciate the opportunity to care for you.

## 2014-11-01 NOTE — Progress Notes (Signed)
Referred by: Dr.Coladonato  Subjective:    Patient ID: [name], female    DOB: 04-19-1951, 64 y.o.   MRN: [id number] Chief complaint: Abdominal pain HPI Patient is here for evaluation of abdominal pain. For several months, probably starting in late 2015 she has been developing upper abdominal pain in the epigastric and periumbilical area. It radiates out to her flanks. In the morning when she gets up and she says the pain does makes her tired. She feels like she has postprandial bloating and swelling and distention. She has some nausea but no vomiting. She might have lost some weight but gained it back. That seemed to be associated with eye surgery. She is on an over-the-counter PPI with some benefit but not really much she thinks. She moves her bowels regularly using a stool softener. Eating seems to make this pain worse. He cannot be much more specific than that but says she feels bad and sore and pain her's in the upper abdomen. She has not had any rectal bleeding or melena. She has anemia of chronic disease related to her end-stage renal disease and other comorbidities. She has never had any type of GI workup she has not had a screening colonoscopy.  Allergies  Allergen Reactions  . Latex Hives   Outpatient Prescriptions Prior to Visit  Medication Sig Dispense Refill  . acetaminophen (TYLENOL) 325 MG tablet Take 650 mg by mouth every 6 (six) hours as needed for mild pain.     Marland Kitchen amLODipine (NORVASC) 10 MG tablet Take 10 mg by mouth daily.    Marland Kitchen aspirin 81 MG tablet Take 81 mg by mouth daily.      . benzonatate (TESSALON) 100 MG capsule Take 1 capsule (100 mg total) by mouth every 8 (eight) hours. 21 capsule 0  . carvedilol (COREG) 12.5 MG tablet Take 12.5 mg by mouth 2 (two) times daily with a meal.    . cloNIDine (CATAPRES) 0.1 MG tablet Take 0.1 mg by mouth 2 (two) times daily.    . cyclobenzaprine (FLEXERIL) 5 MG tablet Take 1 tablet (5 mg total) by mouth 2 (two) times daily as  needed for muscle spasms. 10 tablet 0  . darbepoetin (ARANESP) 200 MCG/0.4ML SOLN injection Inject 0.4 mLs (200 mcg total) into the vein every Wednesday with hemodialysis. 1.68 mL   . doxercalciferol (HECTOROL) 4 MCG/2ML injection Inject 0.5 mLs (1 mcg total) into the vein every Monday, Wednesday, and Friday with hemodialysis. 2 mL   . insulin lispro (HUMALOG KWIKPEN) 100 UNIT/ML SOPN Inject 12-16 Units into the skin 2 (two) times daily. SSI    . lanthanum (FOSRENOL) 1000 MG chewable tablet Chew 1,000 mg by mouth 2 (two) times daily with a meal.    . lisinopril (PRINIVIL,ZESTRIL) 40 MG tablet Take 40 mg by mouth daily.    . multivitamin (RENA-VIT) TABS tablet Take 1 tablet by mouth daily.    . naproxen (NAPROSYN) 250 MG tablet Take 250 mg by mouth 2 (two) times daily as needed for mild pain.    Marland Kitchen ondansetron (ZOFRAN) 4 MG tablet Take 4 mg by mouth every 8 (eight) hours as needed for nausea or vomiting.    Marland Kitchen OVER THE COUNTER MEDICATION Take 2 capsules by mouth daily. Stool Softner    . carvedilol (COREG) 6.25 MG tablet Take 1 tablet (6.25 mg total) by mouth 2 (two) times daily. 60 tablet 11  . pantoprazole (PROTONIX) 20 MG tablet Take 1 tablet (20 mg total) by mouth daily.  30 tablet 0  . traMADol (ULTRAM) 50 MG tablet Take 50 mg by mouth every 6 (six) hours as needed for moderate pain.      No facility-administered medications prior to visit.   Past Medical History  Diagnosis Date  . Hypertension   . GERD (gastroesophageal reflux disease)   . Anemia   . Myalgia 12/31/2011  . Neuropathy 12/31/2011  . Shortness of breath     "lately it's been all the time" (06/15/2013)  . Type II diabetes mellitus   . Arthritis     "joints" (06/15/2013)  . Breast cancer     left  . Anxiety   . ESRD (end stage renal disease)     "suppose to start dialysis today" (06/15/2013)   Past Surgical History  Procedure Laterality Date  . Cesarean section  1980  . Tonsillectomy    . Av fistula placement Right  06/06/2013    Procedure: ARTERIOVENOUS (AV) FISTULA CREATION-RIGHT BRACHIAL CEPHALIC;  Surgeon: Conrad [place], MD;  Location: [facility];  Service: Vascular;  Laterality: Right;  . Removal of a dialysis catheter Right 06/06/2013    Procedure: REMOVAL OF RIGHT MEDIPORT;  Surgeon: Conrad [place], MD;  Location: [facility];  Service: Vascular;  Laterality: Right;  . Cataract extraction w/ anterior vitrectomy Bilateral   . Refractive surgery Bilateral   . Eye surgery Bilateral     laser surgery  . Abdominal hysterectomy      partial  . Breast biopsy Left   . Breast lumpectomy Left     "and took out some lymph nodes" (06/15/2013)   History   Social History  . Marital Status: Single    Spouse Name: N/A  . Number of Children: 2  . Years of Education: N/A   Occupational History  . Diabled    Social History Main Topics  . Smoking status: Never Smoker   . Smokeless tobacco: Never Used  . Alcohol Use: No  . Drug Use: No  . Sexual Activity: No   Other Topics Concern  . None   Social History Narrative   She reports she is single with one son and one daughter. 3 caffeinated beverages a day. She dialyzes Monday Wednesday Friday at the [facility].   11/01/2014      Family History  Problem Relation Age of Onset  . Diabetes Mother   . Hyperlipidemia Mother   . Hypertension Mother   . Hypertension Father   . Diabetes Sister   . Diabetes Brother   . Hypertension Brother   . Heart attack Brother   . Colon cancer Neg Hx   . Colon polyps Neg Hx   . Kidney disease Brother   . Esophageal cancer Neg Hx   . Gallbladder disease Neg Hx     Review of Systems Positive for severe chronic insomnia, she feels like she's had a dry cough lately. All other review of systems are negative or as per history of present illness.    Objective:   Physical Exam @BP  126/60 mmHg  Pulse 76  Ht 5' 3.5" (1.613 m)  Wt 195 lb 6 oz (88.622 kg)  BMI 34.06 kg/m2@  General:  Well-developed,  well-nourished and in no acute distress mildly chronically ill  Eyes:  anicteric. ENT:   Mouth and posterior pharynx free of lesions.  Neck:   supple w/o thyromegaly or mass.  Lungs: Clear to auscultation bilaterally. Heart:  S1S2, no rubs, murmurs, gallops. Abdomen:  soft, with mild to  moderate epigastric tenderness more to deep palpation, no hepatosplenomegaly, hernia, or mass and BS+.   Lymph:  no cervical or supraclavicular adenopathy. Extremities:   no edema, cyanosis or clubbing she has a AV fistula in the right arm bandaged after dialysis today Skin   no rash. Neuro:  A&O x 3.  Psych:  appropriate mood and  Affect.   Data Reviewed: Lab tests from 10/25/2014 showed hemoglobin 9.9. Platelets normal. White count normal. CVA antibody negative. She's had hepatitis B vaccine. Hepatitis B surface antigen is negative. Creatinine 8.3 sodium 139 albumin 3.7 BUN 41 bicarbonate 24 10118  I've also reviewed dialysis center notes. I will look through the imaging on file in Epic. Hemoglobin 10.4 in January 2016.       Assessment & Plan:   1. Abdominal pain, epigastric   2. Periumbilical abdominal pain   3. Epigastric abdominal tenderness    Cause of her problems not entirely clear. I think the differential is very wide here. Ulcer disease is possible upper gastric intestinal tract malignancy is possible. Given that she seems to feel worse and has bloating in the upper abdomen after eating I'm going to start with an upper GI endoscopy. Another possibility is diabetic autonomic neuropathy.  If EGD is unrevealing I would proceed with CT scan. Mesenteric ischemia is possible as she is at risk for this though she is not losing weight in that fashion.  Colonoscopy is appropriate at some point for screening purposes, at this point based upon her symptom complex I don't think it has diagnostic value that outweighs the other options above.  I will prescribed pantoprazole 40 mg daily to replace  over-the-counter omeprazole.  The risks and benefits as well as alternatives of endoscopic procedure(s) have been discussed and reviewed. All questions answered. The patient agrees to proceed.  I appreciate the opportunity to care for this patient.  CC: Tivis Ringer, MD Fleet Contras M.D. and Donato Heinz M.D.

## 2014-11-02 ENCOUNTER — Encounter: Payer: Self-pay | Admitting: Internal Medicine

## 2014-11-02 DIAGNOSIS — C50919 Malignant neoplasm of unspecified site of unspecified female breast: Secondary | ICD-10-CM | POA: Insufficient documentation

## 2014-11-02 DIAGNOSIS — R509 Fever, unspecified: Secondary | ICD-10-CM | POA: Insufficient documentation

## 2014-11-02 DIAGNOSIS — R52 Pain, unspecified: Secondary | ICD-10-CM | POA: Insufficient documentation

## 2014-11-02 DIAGNOSIS — R197 Diarrhea, unspecified: Secondary | ICD-10-CM | POA: Insufficient documentation

## 2014-11-02 DIAGNOSIS — L299 Pruritus, unspecified: Secondary | ICD-10-CM | POA: Insufficient documentation

## 2014-11-03 DIAGNOSIS — N186 End stage renal disease: Secondary | ICD-10-CM | POA: Diagnosis not present

## 2014-11-03 DIAGNOSIS — N2581 Secondary hyperparathyroidism of renal origin: Secondary | ICD-10-CM | POA: Diagnosis not present

## 2014-11-03 DIAGNOSIS — D631 Anemia in chronic kidney disease: Secondary | ICD-10-CM | POA: Diagnosis not present

## 2014-11-06 ENCOUNTER — Other Ambulatory Visit: Payer: Self-pay | Admitting: Oncology

## 2014-11-06 DIAGNOSIS — D631 Anemia in chronic kidney disease: Secondary | ICD-10-CM | POA: Diagnosis not present

## 2014-11-06 DIAGNOSIS — N186 End stage renal disease: Secondary | ICD-10-CM | POA: Diagnosis not present

## 2014-11-06 DIAGNOSIS — Z853 Personal history of malignant neoplasm of breast: Secondary | ICD-10-CM

## 2014-11-06 DIAGNOSIS — N2581 Secondary hyperparathyroidism of renal origin: Secondary | ICD-10-CM | POA: Diagnosis not present

## 2014-11-07 DIAGNOSIS — H3532 Exudative age-related macular degeneration: Secondary | ICD-10-CM | POA: Diagnosis not present

## 2014-11-08 DIAGNOSIS — D631 Anemia in chronic kidney disease: Secondary | ICD-10-CM | POA: Diagnosis not present

## 2014-11-08 DIAGNOSIS — N186 End stage renal disease: Secondary | ICD-10-CM | POA: Diagnosis not present

## 2014-11-08 DIAGNOSIS — N2581 Secondary hyperparathyroidism of renal origin: Secondary | ICD-10-CM | POA: Diagnosis not present

## 2014-11-10 DIAGNOSIS — N2581 Secondary hyperparathyroidism of renal origin: Secondary | ICD-10-CM | POA: Diagnosis not present

## 2014-11-10 DIAGNOSIS — D631 Anemia in chronic kidney disease: Secondary | ICD-10-CM | POA: Diagnosis not present

## 2014-11-10 DIAGNOSIS — N186 End stage renal disease: Secondary | ICD-10-CM | POA: Diagnosis not present

## 2014-11-13 DIAGNOSIS — N186 End stage renal disease: Secondary | ICD-10-CM | POA: Diagnosis not present

## 2014-11-13 DIAGNOSIS — N2581 Secondary hyperparathyroidism of renal origin: Secondary | ICD-10-CM | POA: Diagnosis not present

## 2014-11-13 DIAGNOSIS — D631 Anemia in chronic kidney disease: Secondary | ICD-10-CM | POA: Diagnosis not present

## 2014-11-15 DIAGNOSIS — D631 Anemia in chronic kidney disease: Secondary | ICD-10-CM | POA: Diagnosis not present

## 2014-11-15 DIAGNOSIS — N186 End stage renal disease: Secondary | ICD-10-CM | POA: Diagnosis not present

## 2014-11-15 DIAGNOSIS — N2581 Secondary hyperparathyroidism of renal origin: Secondary | ICD-10-CM | POA: Diagnosis not present

## 2014-11-16 ENCOUNTER — Telehealth: Payer: Self-pay

## 2014-11-16 ENCOUNTER — Other Ambulatory Visit: Payer: Self-pay

## 2014-11-16 ENCOUNTER — Ambulatory Visit (AMBULATORY_SURGERY_CENTER): Payer: Medicare Other | Admitting: Internal Medicine

## 2014-11-16 ENCOUNTER — Encounter: Payer: Self-pay | Admitting: Internal Medicine

## 2014-11-16 VITALS — BP 148/76 | HR 73 | Temp 98.0°F | Resp 56 | Ht 63.0 in | Wt 195.0 lb

## 2014-11-16 DIAGNOSIS — I7090 Unspecified atherosclerosis: Secondary | ICD-10-CM

## 2014-11-16 DIAGNOSIS — N186 End stage renal disease: Secondary | ICD-10-CM | POA: Diagnosis not present

## 2014-11-16 DIAGNOSIS — Z992 Dependence on renal dialysis: Secondary | ICD-10-CM | POA: Diagnosis not present

## 2014-11-16 DIAGNOSIS — E1140 Type 2 diabetes mellitus with diabetic neuropathy, unspecified: Secondary | ICD-10-CM | POA: Diagnosis not present

## 2014-11-16 DIAGNOSIS — R1033 Periumbilical pain: Secondary | ICD-10-CM

## 2014-11-16 DIAGNOSIS — R1013 Epigastric pain: Secondary | ICD-10-CM | POA: Diagnosis not present

## 2014-11-16 DIAGNOSIS — I10 Essential (primary) hypertension: Secondary | ICD-10-CM | POA: Diagnosis not present

## 2014-11-16 DIAGNOSIS — K219 Gastro-esophageal reflux disease without esophagitis: Secondary | ICD-10-CM | POA: Diagnosis not present

## 2014-11-16 DIAGNOSIS — R109 Unspecified abdominal pain: Secondary | ICD-10-CM | POA: Diagnosis not present

## 2014-11-16 DIAGNOSIS — M791 Myalgia: Secondary | ICD-10-CM | POA: Diagnosis not present

## 2014-11-16 LAB — GLUCOSE, CAPILLARY
GLUCOSE-CAPILLARY: 193 mg/dL — AB (ref 70–99)
Glucose-Capillary: 182 mg/dL — ABNORMAL HIGH (ref 70–99)

## 2014-11-16 MED ORDER — SODIUM CHLORIDE 0.9 % IV SOLN: 500.0000 mL | INTRAVENOUS | Status: DC

## 2014-11-16 NOTE — Telephone Encounter (Signed)
Per procedure report from today patient to be scheduled for CT angio for 11/21/14 10:30 at [facility].  She is to be 2 hours NPO.  Patient states that she has a conflict with that time.  I have provided her the phone number to CT scheduling.  She is also a dialysis patient and has to work around that.  She is asked to call and reschedule for a convenient time for her.  She verbalized understanding.

## 2014-11-16 NOTE — Patient Instructions (Signed)
YOU HAD AN ENDOSCOPIC PROCEDURE TODAY AT [facility]:   Refer to the procedure report that was given to you for any specific questions about what was found during the examination.  If the procedure report does not answer your questions, please call your gastroenterologist to clarify.  If you requested that your care partner not be given the details of your procedure findings, then the procedure report has been included in a sealed envelope for you to review at your convenience later.  YOU SHOULD EXPECT: Some feelings of bloating in the abdomen. Passage of more gas than usual.  Walking can help get rid of the air that was put into your GI tract during the procedure and reduce the bloating. If you had a lower endoscopy (such as a colonoscopy or flexible sigmoidoscopy) you may notice spotting of blood in your stool or on the toilet paper. If you underwent a bowel prep for your procedure, you may not have a normal bowel movement for a few days.  Please Note:  You might notice some irritation and congestion in your nose or some drainage.  This is from the oxygen used during your procedure.  There is no need for concern and it should clear up in a day or so.  SYMPTOMS TO REPORT IMMEDIATELY:     Following upper endoscopy (EGD)  Vomiting of blood or coffee ground material  New chest pain or pain under the shoulder blades  Painful or persistently difficult swallowing  New shortness of breath  Fever of 100F or higher  Black, tarry-looking stools  For urgent or emergent issues, a gastroenterologist can be reached at any hour by calling [phone].   DIET: Your first meal following the procedure should be a small meal and then it is ok to progress to your normal diet. Heavy or fried foods are harder to digest and may make you feel nauseous or bloated.  Likewise, meals heavy in dairy and vegetables can increase bloating.  Drink plenty of fluids but you should avoid alcoholic beverages  for 24 hours.  ACTIVITY:  You should plan to take it easy for the rest of today and you should NOT DRIVE or use heavy machinery until tomorrow (because of the sedation medicines used during the test).    FOLLOW UP: Our staff will call the number listed on your records the next business day following your procedure to check on you and address any questions or concerns that you may have regarding the information given to you following your procedure. If we do not reach you, we will leave a message.  However, if you are feeling well and you are not experiencing any problems, there is no need to return our call.  We will assume that you have returned to your regular daily activities without incident.  If any biopsies were taken you will be contacted by phone or by letter within the next 1-3 weeks.  Please call us at [phone] if you have not heard about the biopsies in 3 weeks.    SIGNATURES/CONFIDENTIALITY: You and/or your care partner have signed paperwork which will be entered into your electronic medical record.  These signatures attest to the fact that that the information above on your After Visit Summary has been reviewed and is understood.  Full responsibility of the confidentiality of this discharge information lies with you and/or your care-partner.  DR GESSNER'S OFFICE WILL ARRANGE A CT SCAN OF ABDOMEN AND PELVIS AND CALL YOU WITH DATE &  TIME

## 2014-11-16 NOTE — Progress Notes (Signed)
On arrival patient requesting an IV specialist to start her IV. Patient stating the only vein that can be used, related to fistula in right and lumpectomy on left, is left hand.

## 2014-11-16 NOTE — Progress Notes (Signed)
Report to PACU, RN, vss, BBS= Clear.  

## 2014-11-16 NOTE — Op Note (Signed)
[place]  [address] [place], [id number]   ENDOSCOPY PROCEDURE REPORT  PATIENT: [name], [name]  MR#: [id number] BIRTHDATE: 11-27-1950 , 63  yrs. old GENDER: female ENDOSCOPIST: [name], MD, [facility] PROCEDURE DATE:  11/16/2014 PROCEDURE:  EGD, diagnostic ASA CLASS:     Class III INDICATIONS:  epigastric pain. MEDICATIONS: Propofol 100 mg IV and Monitored anesthesia care TOPICAL ANESTHETIC: none  DESCRIPTION OF PROCEDURE: After the risks benefits and alternatives of the procedure were thoroughly explained, informed consent was obtained.  The LB [id number] endoscope was introduced through the mouth and advanced to the second portion of the duodenum , Without limitations.  The instrument was slowly withdrawn as the mucosa was fully examined.    A few diminutive gastric polyps entirely consistent with benign fundic gland polyps. Otherwise normal esophagus, stomach and duodenum.  Retroflexed views revealed as previously described.     The scope was then withdrawn from the patient and the procedure completed.  COMPLICATIONS: There were no immediate complications.  ENDOSCOPIC IMPRESSION: A few diminutive gastric polyps entirely consistent with benign fundic gland polyps. Not related to symptoms. Otherwise normal esophagus, stomach and duodenum  RECOMMENDATIONS: My offic will schedule a CT abd/pelvis angiogram - note she has ESRD so IV contrast is ok.  reason is to evaluate epigastric pain and periumbilical pain, known atherosclerosis.   eSigned:  [name], MD, [facility] 11/16/2014 10:36 AM    CC: [facility], Drs. Precious Bard, Joe Knox, [place] [facility]

## 2014-11-17 ENCOUNTER — Telehealth: Payer: Self-pay | Admitting: *Deleted

## 2014-11-17 DIAGNOSIS — N2581 Secondary hyperparathyroidism of renal origin: Secondary | ICD-10-CM | POA: Diagnosis not present

## 2014-11-17 DIAGNOSIS — N186 End stage renal disease: Secondary | ICD-10-CM | POA: Diagnosis not present

## 2014-11-17 DIAGNOSIS — D631 Anemia in chronic kidney disease: Secondary | ICD-10-CM | POA: Diagnosis not present

## 2014-11-17 NOTE — Telephone Encounter (Signed)
  Follow up Call-  Call back number 11/16/2014  Post procedure Call Back phone  # [phone]  Permission to leave phone message Yes     Patient questions:  Do you have a fever, pain , or abdominal swelling? No. Pain Score  0 *  Have you tolerated food without any problems? Yes.    Have you been able to return to your normal activities? Yes.    Do you have any questions about your discharge instructions: Diet   No. Medications  No. Follow up visit  No.  Do you have questions or concerns about your Care?no  Actions: * If pain score is 4 or above: No action needed, pain <4.

## 2014-11-18 DIAGNOSIS — N186 End stage renal disease: Secondary | ICD-10-CM | POA: Diagnosis not present

## 2014-11-18 DIAGNOSIS — Z992 Dependence on renal dialysis: Secondary | ICD-10-CM | POA: Diagnosis not present

## 2014-11-18 DIAGNOSIS — E1129 Type 2 diabetes mellitus with other diabetic kidney complication: Secondary | ICD-10-CM | POA: Diagnosis not present

## 2014-11-20 DIAGNOSIS — N186 End stage renal disease: Secondary | ICD-10-CM | POA: Diagnosis not present

## 2014-11-20 DIAGNOSIS — D631 Anemia in chronic kidney disease: Secondary | ICD-10-CM | POA: Diagnosis not present

## 2014-11-20 DIAGNOSIS — D509 Iron deficiency anemia, unspecified: Secondary | ICD-10-CM | POA: Diagnosis not present

## 2014-11-20 DIAGNOSIS — N2581 Secondary hyperparathyroidism of renal origin: Secondary | ICD-10-CM | POA: Diagnosis not present

## 2014-11-21 ENCOUNTER — Inpatient Hospital Stay: Admission: RE | Admit: 2014-11-21 | Payer: Medicare Other | Source: Ambulatory Visit

## 2014-11-22 DIAGNOSIS — N2581 Secondary hyperparathyroidism of renal origin: Secondary | ICD-10-CM | POA: Diagnosis not present

## 2014-11-22 DIAGNOSIS — D631 Anemia in chronic kidney disease: Secondary | ICD-10-CM | POA: Diagnosis not present

## 2014-11-22 DIAGNOSIS — D509 Iron deficiency anemia, unspecified: Secondary | ICD-10-CM | POA: Diagnosis not present

## 2014-11-22 DIAGNOSIS — N186 End stage renal disease: Secondary | ICD-10-CM | POA: Diagnosis not present

## 2014-11-24 DIAGNOSIS — N186 End stage renal disease: Secondary | ICD-10-CM | POA: Diagnosis not present

## 2014-11-24 DIAGNOSIS — D631 Anemia in chronic kidney disease: Secondary | ICD-10-CM | POA: Diagnosis not present

## 2014-11-24 DIAGNOSIS — D509 Iron deficiency anemia, unspecified: Secondary | ICD-10-CM | POA: Diagnosis not present

## 2014-11-24 DIAGNOSIS — N2581 Secondary hyperparathyroidism of renal origin: Secondary | ICD-10-CM | POA: Diagnosis not present

## 2014-11-27 DIAGNOSIS — D509 Iron deficiency anemia, unspecified: Secondary | ICD-10-CM | POA: Diagnosis not present

## 2014-11-27 DIAGNOSIS — N186 End stage renal disease: Secondary | ICD-10-CM | POA: Diagnosis not present

## 2014-11-27 DIAGNOSIS — D631 Anemia in chronic kidney disease: Secondary | ICD-10-CM | POA: Diagnosis not present

## 2014-11-27 DIAGNOSIS — N2581 Secondary hyperparathyroidism of renal origin: Secondary | ICD-10-CM | POA: Diagnosis not present

## 2014-11-29 DIAGNOSIS — D631 Anemia in chronic kidney disease: Secondary | ICD-10-CM | POA: Diagnosis not present

## 2014-11-29 DIAGNOSIS — N186 End stage renal disease: Secondary | ICD-10-CM | POA: Diagnosis not present

## 2014-11-29 DIAGNOSIS — D509 Iron deficiency anemia, unspecified: Secondary | ICD-10-CM | POA: Diagnosis not present

## 2014-11-29 DIAGNOSIS — N2581 Secondary hyperparathyroidism of renal origin: Secondary | ICD-10-CM | POA: Diagnosis not present

## 2014-11-30 ENCOUNTER — Other Ambulatory Visit: Payer: Medicare Other

## 2014-11-30 ENCOUNTER — Ambulatory Visit
Admission: RE | Admit: 2014-11-30 | Discharge: 2014-11-30 | Disposition: A | Discharge status: Home / Self Care | Payer: Medicare Other | Source: Ambulatory Visit | Attending: Oncology | Admitting: Oncology

## 2014-11-30 DIAGNOSIS — R921 Mammographic calcification found on diagnostic imaging of breast: Secondary | ICD-10-CM | POA: Diagnosis not present

## 2014-11-30 DIAGNOSIS — Z853 Personal history of malignant neoplasm of breast: Secondary | ICD-10-CM

## 2014-12-01 DIAGNOSIS — D509 Iron deficiency anemia, unspecified: Secondary | ICD-10-CM | POA: Diagnosis not present

## 2014-12-01 DIAGNOSIS — N2581 Secondary hyperparathyroidism of renal origin: Secondary | ICD-10-CM | POA: Diagnosis not present

## 2014-12-01 DIAGNOSIS — N186 End stage renal disease: Secondary | ICD-10-CM | POA: Diagnosis not present

## 2014-12-01 DIAGNOSIS — D631 Anemia in chronic kidney disease: Secondary | ICD-10-CM | POA: Diagnosis not present

## 2014-12-04 DIAGNOSIS — D631 Anemia in chronic kidney disease: Secondary | ICD-10-CM | POA: Diagnosis not present

## 2014-12-04 DIAGNOSIS — N2581 Secondary hyperparathyroidism of renal origin: Secondary | ICD-10-CM | POA: Diagnosis not present

## 2014-12-04 DIAGNOSIS — N186 End stage renal disease: Secondary | ICD-10-CM | POA: Diagnosis not present

## 2014-12-04 DIAGNOSIS — D509 Iron deficiency anemia, unspecified: Secondary | ICD-10-CM | POA: Diagnosis not present

## 2014-12-05 ENCOUNTER — Ambulatory Visit
Admission: RE | Admit: 2014-12-05 | Discharge: 2014-12-05 | Disposition: A | Discharge status: Home / Self Care | Payer: Medicare Other | Source: Ambulatory Visit | Attending: Internal Medicine | Admitting: Internal Medicine

## 2014-12-05 ENCOUNTER — Telehealth: Payer: Self-pay

## 2014-12-05 ENCOUNTER — Telehealth: Payer: Self-pay | Admitting: Hematology and Oncology

## 2014-12-05 DIAGNOSIS — G8929 Other chronic pain: Secondary | ICD-10-CM

## 2014-12-05 DIAGNOSIS — R1033 Periumbilical pain: Secondary | ICD-10-CM

## 2014-12-05 DIAGNOSIS — R1013 Epigastric pain: Secondary | ICD-10-CM

## 2014-12-05 DIAGNOSIS — I7090 Unspecified atherosclerosis: Secondary | ICD-10-CM

## 2014-12-05 MED ORDER — IOHEXOL 350 MG/ML SOLN: 100.0000 mL | Freq: Once | INTRAVENOUS | Status: AC | PRN

## 2014-12-05 NOTE — Telephone Encounter (Signed)
Patient called in to make a new appointment as she missed her 02/2014 appointments

## 2014-12-05 NOTE — Telephone Encounter (Signed)
Pt was at CT and has dialysis shunt in right arm and has had a lumpectomy and 5 lymph nodes removed from the left side. Pt states she was told she could not be stuck above the hand on the left arm. CT will not inject in the arm due to the risk of tissue necrosis if it infiltrated. Per Dr. Carlean Purl do not do CT, he will order an [place]. Lattie Haw at [place] [facility] wanted to let Dr. Carlean Purl know that the pt does have veins in her arm if he would like her to try and get clearance from oncology to stick her above the hand. States the pt has not been seen there since Dr. Humphrey Rolls left. Asked Lattie Haw to let the pt know we will call her with a plan. Please advise.

## 2014-12-06 DIAGNOSIS — N186 End stage renal disease: Secondary | ICD-10-CM | POA: Diagnosis not present

## 2014-12-06 DIAGNOSIS — D631 Anemia in chronic kidney disease: Secondary | ICD-10-CM | POA: Diagnosis not present

## 2014-12-06 DIAGNOSIS — N2581 Secondary hyperparathyroidism of renal origin: Secondary | ICD-10-CM | POA: Diagnosis not present

## 2014-12-06 DIAGNOSIS — D509 Iron deficiency anemia, unspecified: Secondary | ICD-10-CM | POA: Diagnosis not present

## 2014-12-06 NOTE — Telephone Encounter (Signed)
I see she is a patient of Dr. Acie Fredrickson - let me ask him

## 2014-12-06 NOTE — Telephone Encounter (Signed)
Patient is notified of recommendations and appt details.  I have also mailed her a letter with the appt details.  She was at dialysis and did not have anything to write on. She will call me back if she has any questions or concerns.

## 2014-12-06 NOTE — Addendum Note (Signed)
Addended by: Marlon Pel on: 12/06/2014 02:05 PM   Modules accepted: Orders

## 2014-12-06 NOTE — Telephone Encounter (Signed)
Instead of the CT I would like her to do a Doppler study of the celiac and superior mesenteric arteries.  [facility] cardiology does this in their lab but I do not remember the exact name of the study-   Reason for exam is epigastric pain, known atherosclerosis  Dr. Sherren Mocha reads these so his nurse can help [place] find name of study and schedule I bet

## 2014-12-07 DIAGNOSIS — Z6833 Body mass index (BMI) 33.0-33.9, adult: Secondary | ICD-10-CM | POA: Diagnosis not present

## 2014-12-07 DIAGNOSIS — N186 End stage renal disease: Secondary | ICD-10-CM | POA: Diagnosis not present

## 2014-12-07 DIAGNOSIS — I10 Essential (primary) hypertension: Secondary | ICD-10-CM | POA: Diagnosis not present

## 2014-12-07 DIAGNOSIS — E1129 Type 2 diabetes mellitus with other diabetic kidney complication: Secondary | ICD-10-CM | POA: Diagnosis not present

## 2014-12-08 DIAGNOSIS — D631 Anemia in chronic kidney disease: Secondary | ICD-10-CM | POA: Diagnosis not present

## 2014-12-08 DIAGNOSIS — N186 End stage renal disease: Secondary | ICD-10-CM | POA: Diagnosis not present

## 2014-12-08 DIAGNOSIS — D509 Iron deficiency anemia, unspecified: Secondary | ICD-10-CM | POA: Diagnosis not present

## 2014-12-08 DIAGNOSIS — N2581 Secondary hyperparathyroidism of renal origin: Secondary | ICD-10-CM | POA: Diagnosis not present

## 2014-12-11 DIAGNOSIS — N2581 Secondary hyperparathyroidism of renal origin: Secondary | ICD-10-CM | POA: Diagnosis not present

## 2014-12-11 DIAGNOSIS — N186 End stage renal disease: Secondary | ICD-10-CM | POA: Diagnosis not present

## 2014-12-11 DIAGNOSIS — D509 Iron deficiency anemia, unspecified: Secondary | ICD-10-CM | POA: Diagnosis not present

## 2014-12-11 DIAGNOSIS — D631 Anemia in chronic kidney disease: Secondary | ICD-10-CM | POA: Diagnosis not present

## 2014-12-12 DIAGNOSIS — N760 Acute vaginitis: Secondary | ICD-10-CM | POA: Diagnosis not present

## 2014-12-12 DIAGNOSIS — Z01419 Encounter for gynecological examination (general) (routine) without abnormal findings: Secondary | ICD-10-CM | POA: Diagnosis not present

## 2014-12-12 DIAGNOSIS — Z6834 Body mass index (BMI) 34.0-34.9, adult: Secondary | ICD-10-CM | POA: Diagnosis not present

## 2014-12-13 DIAGNOSIS — N2581 Secondary hyperparathyroidism of renal origin: Secondary | ICD-10-CM | POA: Diagnosis not present

## 2014-12-13 DIAGNOSIS — N186 End stage renal disease: Secondary | ICD-10-CM | POA: Diagnosis not present

## 2014-12-13 DIAGNOSIS — D509 Iron deficiency anemia, unspecified: Secondary | ICD-10-CM | POA: Diagnosis not present

## 2014-12-13 DIAGNOSIS — D631 Anemia in chronic kidney disease: Secondary | ICD-10-CM | POA: Diagnosis not present

## 2014-12-14 ENCOUNTER — Encounter (HOSPITAL_COMMUNITY): Payer: Medicare Other

## 2014-12-15 DIAGNOSIS — N2581 Secondary hyperparathyroidism of renal origin: Secondary | ICD-10-CM | POA: Diagnosis not present

## 2014-12-15 DIAGNOSIS — D509 Iron deficiency anemia, unspecified: Secondary | ICD-10-CM | POA: Diagnosis not present

## 2014-12-15 DIAGNOSIS — N186 End stage renal disease: Secondary | ICD-10-CM | POA: Diagnosis not present

## 2014-12-15 DIAGNOSIS — D631 Anemia in chronic kidney disease: Secondary | ICD-10-CM | POA: Diagnosis not present

## 2014-12-18 DIAGNOSIS — N186 End stage renal disease: Secondary | ICD-10-CM | POA: Diagnosis not present

## 2014-12-18 DIAGNOSIS — D509 Iron deficiency anemia, unspecified: Secondary | ICD-10-CM | POA: Diagnosis not present

## 2014-12-18 DIAGNOSIS — N2581 Secondary hyperparathyroidism of renal origin: Secondary | ICD-10-CM | POA: Diagnosis not present

## 2014-12-18 DIAGNOSIS — D631 Anemia in chronic kidney disease: Secondary | ICD-10-CM | POA: Diagnosis not present

## 2014-12-19 ENCOUNTER — Telehealth: Payer: Self-pay | Admitting: Hematology and Oncology

## 2014-12-19 DIAGNOSIS — N186 End stage renal disease: Secondary | ICD-10-CM | POA: Diagnosis not present

## 2014-12-19 DIAGNOSIS — Z992 Dependence on renal dialysis: Secondary | ICD-10-CM | POA: Diagnosis not present

## 2014-12-19 DIAGNOSIS — E1129 Type 2 diabetes mellitus with other diabetic kidney complication: Secondary | ICD-10-CM | POA: Diagnosis not present

## 2014-12-19 NOTE — Telephone Encounter (Signed)
Patient left message requesting appointment and wanting to know if she had another doctor. Per patient's message she was never given another appointment when [place] left. Patient missed lab/fu appointments August 2015 and recently contacted office 12/05/14 requesting to reschedule. Patient was scheduled for lab/VG 12/26/14. Returned patient's call and confirmed 12/26/14 appointment. Schedule mailed.

## 2014-12-20 ENCOUNTER — Telehealth: Payer: Self-pay | Admitting: Hematology and Oncology

## 2014-12-20 DIAGNOSIS — D631 Anemia in chronic kidney disease: Secondary | ICD-10-CM | POA: Diagnosis not present

## 2014-12-20 DIAGNOSIS — N2581 Secondary hyperparathyroidism of renal origin: Secondary | ICD-10-CM | POA: Diagnosis not present

## 2014-12-20 DIAGNOSIS — N186 End stage renal disease: Secondary | ICD-10-CM | POA: Diagnosis not present

## 2014-12-20 DIAGNOSIS — D509 Iron deficiency anemia, unspecified: Secondary | ICD-10-CM | POA: Diagnosis not present

## 2014-12-20 NOTE — Telephone Encounter (Signed)
Patient called in to reschedule her appointments °

## 2014-12-22 DIAGNOSIS — D509 Iron deficiency anemia, unspecified: Secondary | ICD-10-CM | POA: Diagnosis not present

## 2014-12-22 DIAGNOSIS — D631 Anemia in chronic kidney disease: Secondary | ICD-10-CM | POA: Diagnosis not present

## 2014-12-22 DIAGNOSIS — N186 End stage renal disease: Secondary | ICD-10-CM | POA: Diagnosis not present

## 2014-12-22 DIAGNOSIS — N2581 Secondary hyperparathyroidism of renal origin: Secondary | ICD-10-CM | POA: Diagnosis not present

## 2014-12-25 DIAGNOSIS — N2581 Secondary hyperparathyroidism of renal origin: Secondary | ICD-10-CM | POA: Diagnosis not present

## 2014-12-25 DIAGNOSIS — D509 Iron deficiency anemia, unspecified: Secondary | ICD-10-CM | POA: Diagnosis not present

## 2014-12-25 DIAGNOSIS — D631 Anemia in chronic kidney disease: Secondary | ICD-10-CM | POA: Diagnosis not present

## 2014-12-25 DIAGNOSIS — N186 End stage renal disease: Secondary | ICD-10-CM | POA: Diagnosis not present

## 2014-12-26 ENCOUNTER — Ambulatory Visit: Payer: Medicare Other | Admitting: Hematology and Oncology

## 2014-12-26 ENCOUNTER — Other Ambulatory Visit: Payer: Medicare Other

## 2014-12-27 DIAGNOSIS — D631 Anemia in chronic kidney disease: Secondary | ICD-10-CM | POA: Diagnosis not present

## 2014-12-27 DIAGNOSIS — N2581 Secondary hyperparathyroidism of renal origin: Secondary | ICD-10-CM | POA: Diagnosis not present

## 2014-12-27 DIAGNOSIS — D509 Iron deficiency anemia, unspecified: Secondary | ICD-10-CM | POA: Diagnosis not present

## 2014-12-27 DIAGNOSIS — N186 End stage renal disease: Secondary | ICD-10-CM | POA: Diagnosis not present

## 2014-12-28 ENCOUNTER — Telehealth: Payer: Self-pay | Admitting: Nurse Practitioner

## 2014-12-28 NOTE — Telephone Encounter (Signed)
pt cld to get time & date of appt-gave pt time & date

## 2014-12-29 DIAGNOSIS — D509 Iron deficiency anemia, unspecified: Secondary | ICD-10-CM | POA: Diagnosis not present

## 2014-12-29 DIAGNOSIS — N186 End stage renal disease: Secondary | ICD-10-CM | POA: Diagnosis not present

## 2014-12-29 DIAGNOSIS — N2581 Secondary hyperparathyroidism of renal origin: Secondary | ICD-10-CM | POA: Diagnosis not present

## 2014-12-29 DIAGNOSIS — D631 Anemia in chronic kidney disease: Secondary | ICD-10-CM | POA: Diagnosis not present

## 2015-01-01 DIAGNOSIS — D631 Anemia in chronic kidney disease: Secondary | ICD-10-CM | POA: Diagnosis not present

## 2015-01-01 DIAGNOSIS — N2581 Secondary hyperparathyroidism of renal origin: Secondary | ICD-10-CM | POA: Diagnosis not present

## 2015-01-01 DIAGNOSIS — D509 Iron deficiency anemia, unspecified: Secondary | ICD-10-CM | POA: Diagnosis not present

## 2015-01-01 DIAGNOSIS — N186 End stage renal disease: Secondary | ICD-10-CM | POA: Diagnosis not present

## 2015-01-02 DIAGNOSIS — H3532 Exudative age-related macular degeneration: Secondary | ICD-10-CM | POA: Diagnosis not present

## 2015-01-03 DIAGNOSIS — N2581 Secondary hyperparathyroidism of renal origin: Secondary | ICD-10-CM | POA: Diagnosis not present

## 2015-01-03 DIAGNOSIS — N186 End stage renal disease: Secondary | ICD-10-CM | POA: Diagnosis not present

## 2015-01-03 DIAGNOSIS — D631 Anemia in chronic kidney disease: Secondary | ICD-10-CM | POA: Diagnosis not present

## 2015-01-03 DIAGNOSIS — D509 Iron deficiency anemia, unspecified: Secondary | ICD-10-CM | POA: Diagnosis not present

## 2015-01-05 ENCOUNTER — Ambulatory Visit: Payer: Medicare Other | Admitting: Nurse Practitioner

## 2015-01-05 DIAGNOSIS — D509 Iron deficiency anemia, unspecified: Secondary | ICD-10-CM | POA: Diagnosis not present

## 2015-01-05 DIAGNOSIS — N2581 Secondary hyperparathyroidism of renal origin: Secondary | ICD-10-CM | POA: Diagnosis not present

## 2015-01-05 DIAGNOSIS — D631 Anemia in chronic kidney disease: Secondary | ICD-10-CM | POA: Diagnosis not present

## 2015-01-05 DIAGNOSIS — N186 End stage renal disease: Secondary | ICD-10-CM | POA: Diagnosis not present

## 2015-01-08 ENCOUNTER — Other Ambulatory Visit: Payer: Self-pay | Admitting: *Deleted

## 2015-01-08 DIAGNOSIS — C50212 Malignant neoplasm of upper-inner quadrant of left female breast: Secondary | ICD-10-CM

## 2015-01-08 DIAGNOSIS — D509 Iron deficiency anemia, unspecified: Secondary | ICD-10-CM | POA: Diagnosis not present

## 2015-01-08 DIAGNOSIS — N186 End stage renal disease: Secondary | ICD-10-CM | POA: Diagnosis not present

## 2015-01-08 DIAGNOSIS — N2581 Secondary hyperparathyroidism of renal origin: Secondary | ICD-10-CM | POA: Diagnosis not present

## 2015-01-08 DIAGNOSIS — D631 Anemia in chronic kidney disease: Secondary | ICD-10-CM | POA: Diagnosis not present

## 2015-01-09 ENCOUNTER — Ambulatory Visit (HOSPITAL_BASED_OUTPATIENT_CLINIC_OR_DEPARTMENT_OTHER): Payer: Medicare Other | Admitting: Nurse Practitioner

## 2015-01-09 ENCOUNTER — Telehealth: Payer: Self-pay | Admitting: Nurse Practitioner

## 2015-01-09 ENCOUNTER — Encounter: Payer: Self-pay | Admitting: Nurse Practitioner

## 2015-01-09 ENCOUNTER — Other Ambulatory Visit: Payer: Medicare Other

## 2015-01-09 VITALS — BP 146/68 | HR 75 | Temp 98.6°F | Resp 19 | Ht 63.0 in | Wt 200.3 lb

## 2015-01-09 DIAGNOSIS — C50212 Malignant neoplasm of upper-inner quadrant of left female breast: Secondary | ICD-10-CM

## 2015-01-09 DIAGNOSIS — N289 Disorder of kidney and ureter, unspecified: Secondary | ICD-10-CM | POA: Diagnosis not present

## 2015-01-09 DIAGNOSIS — D638 Anemia in other chronic diseases classified elsewhere: Secondary | ICD-10-CM | POA: Diagnosis not present

## 2015-01-09 DIAGNOSIS — Z853 Personal history of malignant neoplasm of breast: Secondary | ICD-10-CM | POA: Diagnosis not present

## 2015-01-09 NOTE — Telephone Encounter (Signed)
Gave avs & calendar for June 2017 °

## 2015-01-09 NOTE — Progress Notes (Signed)
OFFICE PROGRESS NOTE  CC Dr. Fanny Skates Dr. Evette Cristal Dr. Berneta Sages  DIAGNOSIS: 64 year-old female with stage I breast cancer.  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 She underwent a lumpectomy with sentinel node dissection.  #2 she then received 4 cycles of dose dense FEC 100 and 12 weeks of single agent Taxol given adjuvantly.  #3 she then went on to receive radiation therapy to the left breast. All of her therapy was completed in October 2012  #4 bone scan performed on 12/22/2011 no evidence of metastatic disease a significant amount of arthritis.  #5 renal insufficiency: Patient is being seen by nephrology.  #6 anemia: Due to chronic disease and renal insufficiency  CURRENT THERAPY:Observation  INTERVAL HISTORY: [name] 64 y.o. female returns for follow up of her breast cancer. Overall she is doing well. Her last mammogram showed likely benign calcifications to her left breast, and she will have this reevaluated in 6 months. She continues on hemodialysis MWF. She plans to have a CT angio performed for unresolved abdominal pain under the care of Dr. Carlean Purl. She has sharp pains but an abdominal xray was negative. She eats well and moves her bowels well however. She has chronic back pain that is unchanged. A detailed review of systems is otherwise stable.  MEDICAL HISTORY: Past Medical History  Diagnosis Date  . Hypertension   . GERD (gastroesophageal reflux disease)   . Anemia   . Myalgia 12/31/2011  . Neuropathy 12/31/2011  . Shortness of breath     "lately it's been all the time" (06/15/2013)  . Type II diabetes mellitus   . Arthritis     "joints" (06/15/2013)  . Breast cancer     left  . Anxiety   . ESRD (end stage renal disease)     "suppose to start dialysis today" (06/15/2013)  . CHF (congestive heart failure)   . Blood transfusion without reported diagnosis      ALLERGIES:  is allergic to latex.  MEDICATIONS:  Current Outpatient Prescriptions  Medication Sig Dispense Refill  . aspirin 81 MG tablet Take 81 mg by mouth daily.      . carvedilol (COREG) 12.5 MG tablet Take 12.5 mg by mouth 2 (two) times daily with a meal.    . cloNIDine (CATAPRES) 0.1 MG tablet Take 0.1 mg by mouth 2 (two) times daily.    . darbepoetin (ARANESP) 200 MCG/0.4ML SOLN injection Inject 0.4 mLs (200 mcg total) into the vein every Wednesday with hemodialysis. 1.68 mL   . doxercalciferol (HECTOROL) 4 MCG/2ML injection Inject 0.5 mLs (1 mcg total) into the vein every Monday, Wednesday, and Friday with hemodialysis. 2 mL   . furosemide (LASIX) 80 MG tablet Take 80 mg by mouth every other day.   0  . insulin lispro (HUMALOG KWIKPEN) 100 UNIT/ML SOPN Inject 12-16 Units into the skin 2 (two) times daily. SSI    . lanthanum (FOSRENOL) 1000 MG chewable tablet Chew 1,000 mg by mouth 2 (two) times daily with a meal.    . LEVEMIR FLEXTOUCH 100 UNIT/ML Pen INJECT 10 UNITS INTO SKIN ONCE DAILY  0  . lisinopril (PRINIVIL,ZESTRIL) 40 MG tablet Take 40 mg by mouth daily.    . multivitamin (RENA-VIT) TABS tablet Take 1 tablet by mouth daily.    . ONE TOUCH ULTRA TEST test strip 1 each by Other route 3 (three) times daily. for testing  0  .  OVER THE COUNTER MEDICATION Take 2 capsules by mouth daily. Stool Softner    . pantoprazole (PROTONIX) 20 MG tablet Take 20 mg by mouth daily.  0  . acetaminophen (TYLENOL) 325 MG tablet Take 650 mg by mouth every 6 (six) hours as needed for mild pain.     . benzonatate (TESSALON) 100 MG capsule Take 1 capsule (100 mg total) by mouth every 8 (eight) hours. (Patient not taking: Reported on 01/09/2015) 21 capsule 0  . cyclobenzaprine (FLEXERIL) 5 MG tablet Take 1 tablet (5 mg total) by mouth 2 (two) times daily as needed for muscle spasms. (Patient not taking: Reported on 01/09/2015) 10 tablet 0  . naproxen (NAPROSYN) 250 MG tablet Take 250 mg by mouth 2  (two) times daily as needed for mild pain.    Marland Kitchen PROAIR HFA 108 (90 BASE) MCG/ACT inhaler Inhale 1-2 puffs into the lungs daily.   1   No current facility-administered medications for this visit.    SURGICAL HISTORY:  Past Surgical History  Procedure Laterality Date  . Cesarean section  1980  . Tonsillectomy    . Av fistula placement Right 06/06/2013    Procedure: ARTERIOVENOUS (AV) FISTULA CREATION-RIGHT BRACHIAL CEPHALIC;  Surgeon: Conrad Benoit, MD;  Location: [place];  Service: Vascular;  Laterality: Right;  . Removal of a dialysis catheter Right 06/06/2013    Procedure: REMOVAL OF RIGHT MEDIPORT;  Surgeon: Conrad , MD;  Location: [place];  Service: Vascular;  Laterality: Right;  . Cataract extraction w/ anterior vitrectomy Bilateral   . Refractive surgery Bilateral   . Eye surgery Bilateral     laser surgery  . Abdominal hysterectomy      partial  . Breast biopsy Left   . Breast lumpectomy Left     "and took out some lymph nodes" (06/15/2013)    REVIEW OF SYSTEMS:  Pertinent items are noted in HPI.   PHYSICAL EXAMINATION:  Skin: warm, dry  HEENT: sclerae anicteric, conjunctivae pink, oropharynx clear. No thrush or mucositis.  Lymph Nodes: No cervical or supraclavicular lymphadenopathy  Lungs: clear to auscultation bilaterally, no rales, wheezes, or rhonci  Heart: regular rate and rhythm, right arm AV fistula Abdomen: round, soft, non tender, positive bowel sounds  Musculoskeletal: No focal spinal tenderness, no peripheral edema  Neuro: non focal, well oriented, positive affect  Breasts: left breast status post lumpectomy and radiation. No evidence of recurrent disease. Left axilla benign. Right breast unremarkable.  ECOG PERFORMANCE STATUS: 1 - Symptomatic but completely ambulatory  Blood pressure 146/68, pulse 75, temperature 98.6 F (37 C), temperature source Oral, resp. rate 19, height 5\' 3"  (1.6 m), weight 200 lb 4.8 oz (90.855 kg), SpO2 98 %.  LABORATORY  DATA: Lab Results  Component Value Date   WBC 7.6 08/09/2014   HGB 10.4* 08/09/2014   HCT 31.7* 08/09/2014   MCV 91.1 08/09/2014   PLT 251 08/09/2014      Chemistry      Component Value Date/Time   NA 139 08/09/2014 0229   NA 141 06/13/2013 1037   K 3.7 08/09/2014 0229   K 4.4 06/13/2013 1037   CL 99 08/09/2014 0229   CL 110* 10/01/2012 1052   CO2 25 08/09/2014 0229   CO2 20* 06/13/2013 1037   BUN 37* 08/09/2014 0229   BUN 93.4* 06/13/2013 1037   CREATININE 7.77* 08/09/2014 0229   CREATININE 9.3* 06/13/2013 1037   GLU 98 12/13/2010 1108      Component Value Date/Time  CALCIUM 9.4 08/09/2014 0229   CALCIUM 8.8 06/13/2013 1037   ALKPHOS 74 06/13/2013 1037   ALKPHOS 77 11/24/2011 0940   AST 21 06/13/2013 1037   AST 19 11/24/2011 0940   ALT 28 06/13/2013 1037   ALT 28 11/24/2011 0940   BILITOT 0.22 06/13/2013 1037   BILITOT 0.3 11/24/2011 0940       RADIOGRAPHIC STUDIES: No results found.  EXAM: DIGITAL DIAGNOSTIC BILATERAL MAMMOGRAM WITH 3D TOMOSYNTHESIS AND CAD  COMPARISON: [facility]  ACR Breast Density Category b: There are scattered areas of fibroglandular density.  FINDINGS: Stable postlumpectomy changes left breast. Interval development of serpiginous calcifications within the upper-outer right breast, further evaluated with magnification views and favored to represent vascular calcifications.  Additionally there are coarse calcifications within the posterior left breast further evaluated with magnification true lateral views. Multiple exaggerated CC lateral magnification views were obtained and these calcifications were not able to be imaged in this projection, likely secondary to the posterior location.  Mammographic images were processed with CAD.  IMPRESSION: Stable left breast lumpectomy changes.  Likely benign early dystrophic calcifications left breast posteriorly.  RECOMMENDATION: Left breast diagnostic mammography in 6 months  to demonstrate stability of likely benign early dystrophic calcifications left breast.  I have discussed the findings and recommendations with the patient. Results were also provided in writing at the conclusion of the visit. If applicable, a reminder letter will be sent to the patient regarding the next appointment.  BI-RADS CATEGORY 3: Probably benign.   Electronically Signed  By: Lovey Newcomer M.D.  On: 11/30/2014 16:39  ASSESSMENT: 64 year old female with:  1.  triple negative invasive ductal carcinoma of the left breast diagnosed in 2012. She is status post lumpectomy with sentinel node biopsy for a stage I disease. She then went on to receive adjuvant chemotherapy consisting of dose dense FEC 100 for 4 cycles followed by 12 weeks of single agent Taxol. Overall she tolerated the chemotherapy relatively well. She also completed postlumpectomy radiation therapy in October 2012. Since then she has been on observation only.  #2 patient had a bone scan performed that did not reveal any evidence of metastatic disease but she is noted to have significant amount of arthritis.    #3 musculoskeletal pain chronic do to her arthritis and possibly peripheral neuropathies.  #4 chronic anemia with  renal insufficiency .  PLAN:  #1 continue to monitor patient clinically. Labs to be performed at hemodialysis center.  #2 Dr. Lindi Adie will see the patient back in 1 year.  #3 Patient reassured that infrequent use of left arm for blood pressure and IV sticks is ok. Soon to have an abdominal MRI to evaluate stomach discomfort.    All questions were answered. The patient knows to call the clinic with any problems, questions or concerns. We can certainly see the patient much sooner if necessary.  Laurie Panda, NP 01/09/2015, 3:18 PM

## 2015-01-10 DIAGNOSIS — N186 End stage renal disease: Secondary | ICD-10-CM | POA: Diagnosis not present

## 2015-01-10 DIAGNOSIS — D509 Iron deficiency anemia, unspecified: Secondary | ICD-10-CM | POA: Diagnosis not present

## 2015-01-10 DIAGNOSIS — D631 Anemia in chronic kidney disease: Secondary | ICD-10-CM | POA: Diagnosis not present

## 2015-01-10 DIAGNOSIS — N2581 Secondary hyperparathyroidism of renal origin: Secondary | ICD-10-CM | POA: Diagnosis not present

## 2015-01-12 DIAGNOSIS — D631 Anemia in chronic kidney disease: Secondary | ICD-10-CM | POA: Diagnosis not present

## 2015-01-12 DIAGNOSIS — N186 End stage renal disease: Secondary | ICD-10-CM | POA: Diagnosis not present

## 2015-01-12 DIAGNOSIS — N2581 Secondary hyperparathyroidism of renal origin: Secondary | ICD-10-CM | POA: Diagnosis not present

## 2015-01-12 DIAGNOSIS — D509 Iron deficiency anemia, unspecified: Secondary | ICD-10-CM | POA: Diagnosis not present

## 2015-01-15 ENCOUNTER — Other Ambulatory Visit: Payer: Self-pay

## 2015-01-15 DIAGNOSIS — D631 Anemia in chronic kidney disease: Secondary | ICD-10-CM | POA: Diagnosis not present

## 2015-01-15 DIAGNOSIS — N2581 Secondary hyperparathyroidism of renal origin: Secondary | ICD-10-CM | POA: Diagnosis not present

## 2015-01-15 DIAGNOSIS — N186 End stage renal disease: Secondary | ICD-10-CM | POA: Diagnosis not present

## 2015-01-15 DIAGNOSIS — D509 Iron deficiency anemia, unspecified: Secondary | ICD-10-CM | POA: Diagnosis not present

## 2015-01-17 DIAGNOSIS — N2581 Secondary hyperparathyroidism of renal origin: Secondary | ICD-10-CM | POA: Diagnosis not present

## 2015-01-17 DIAGNOSIS — D631 Anemia in chronic kidney disease: Secondary | ICD-10-CM | POA: Diagnosis not present

## 2015-01-17 DIAGNOSIS — D509 Iron deficiency anemia, unspecified: Secondary | ICD-10-CM | POA: Diagnosis not present

## 2015-01-17 DIAGNOSIS — N186 End stage renal disease: Secondary | ICD-10-CM | POA: Diagnosis not present

## 2015-01-18 DIAGNOSIS — Z992 Dependence on renal dialysis: Secondary | ICD-10-CM | POA: Diagnosis not present

## 2015-01-18 DIAGNOSIS — N186 End stage renal disease: Secondary | ICD-10-CM | POA: Diagnosis not present

## 2015-01-18 DIAGNOSIS — E1129 Type 2 diabetes mellitus with other diabetic kidney complication: Secondary | ICD-10-CM | POA: Diagnosis not present

## 2015-01-19 DIAGNOSIS — N186 End stage renal disease: Secondary | ICD-10-CM | POA: Diagnosis not present

## 2015-01-19 DIAGNOSIS — D509 Iron deficiency anemia, unspecified: Secondary | ICD-10-CM | POA: Diagnosis not present

## 2015-01-19 DIAGNOSIS — N2581 Secondary hyperparathyroidism of renal origin: Secondary | ICD-10-CM | POA: Diagnosis not present

## 2015-01-19 DIAGNOSIS — D631 Anemia in chronic kidney disease: Secondary | ICD-10-CM | POA: Diagnosis not present

## 2015-01-22 DIAGNOSIS — N186 End stage renal disease: Secondary | ICD-10-CM | POA: Diagnosis not present

## 2015-01-22 DIAGNOSIS — D631 Anemia in chronic kidney disease: Secondary | ICD-10-CM | POA: Diagnosis not present

## 2015-01-22 DIAGNOSIS — N2581 Secondary hyperparathyroidism of renal origin: Secondary | ICD-10-CM | POA: Diagnosis not present

## 2015-01-22 DIAGNOSIS — D509 Iron deficiency anemia, unspecified: Secondary | ICD-10-CM | POA: Diagnosis not present

## 2015-01-24 DIAGNOSIS — D509 Iron deficiency anemia, unspecified: Secondary | ICD-10-CM | POA: Diagnosis not present

## 2015-01-24 DIAGNOSIS — D631 Anemia in chronic kidney disease: Secondary | ICD-10-CM | POA: Diagnosis not present

## 2015-01-24 DIAGNOSIS — N2581 Secondary hyperparathyroidism of renal origin: Secondary | ICD-10-CM | POA: Diagnosis not present

## 2015-01-24 DIAGNOSIS — N186 End stage renal disease: Secondary | ICD-10-CM | POA: Diagnosis not present

## 2015-01-26 DIAGNOSIS — D631 Anemia in chronic kidney disease: Secondary | ICD-10-CM | POA: Diagnosis not present

## 2015-01-26 DIAGNOSIS — N186 End stage renal disease: Secondary | ICD-10-CM | POA: Diagnosis not present

## 2015-01-26 DIAGNOSIS — D509 Iron deficiency anemia, unspecified: Secondary | ICD-10-CM | POA: Diagnosis not present

## 2015-01-26 DIAGNOSIS — N2581 Secondary hyperparathyroidism of renal origin: Secondary | ICD-10-CM | POA: Diagnosis not present

## 2015-01-29 DIAGNOSIS — N2581 Secondary hyperparathyroidism of renal origin: Secondary | ICD-10-CM | POA: Diagnosis not present

## 2015-01-29 DIAGNOSIS — D631 Anemia in chronic kidney disease: Secondary | ICD-10-CM | POA: Diagnosis not present

## 2015-01-29 DIAGNOSIS — D509 Iron deficiency anemia, unspecified: Secondary | ICD-10-CM | POA: Diagnosis not present

## 2015-01-29 DIAGNOSIS — N186 End stage renal disease: Secondary | ICD-10-CM | POA: Diagnosis not present

## 2015-01-31 DIAGNOSIS — N2581 Secondary hyperparathyroidism of renal origin: Secondary | ICD-10-CM | POA: Diagnosis not present

## 2015-01-31 DIAGNOSIS — N186 End stage renal disease: Secondary | ICD-10-CM | POA: Diagnosis not present

## 2015-01-31 DIAGNOSIS — D631 Anemia in chronic kidney disease: Secondary | ICD-10-CM | POA: Diagnosis not present

## 2015-01-31 DIAGNOSIS — D509 Iron deficiency anemia, unspecified: Secondary | ICD-10-CM | POA: Diagnosis not present

## 2015-02-02 DIAGNOSIS — N186 End stage renal disease: Secondary | ICD-10-CM | POA: Diagnosis not present

## 2015-02-02 DIAGNOSIS — D509 Iron deficiency anemia, unspecified: Secondary | ICD-10-CM | POA: Diagnosis not present

## 2015-02-02 DIAGNOSIS — N2581 Secondary hyperparathyroidism of renal origin: Secondary | ICD-10-CM | POA: Diagnosis not present

## 2015-02-02 DIAGNOSIS — D631 Anemia in chronic kidney disease: Secondary | ICD-10-CM | POA: Diagnosis not present

## 2015-02-05 DIAGNOSIS — N2581 Secondary hyperparathyroidism of renal origin: Secondary | ICD-10-CM | POA: Diagnosis not present

## 2015-02-05 DIAGNOSIS — D631 Anemia in chronic kidney disease: Secondary | ICD-10-CM | POA: Diagnosis not present

## 2015-02-05 DIAGNOSIS — N186 End stage renal disease: Secondary | ICD-10-CM | POA: Diagnosis not present

## 2015-02-05 DIAGNOSIS — D509 Iron deficiency anemia, unspecified: Secondary | ICD-10-CM | POA: Diagnosis not present

## 2015-02-06 ENCOUNTER — Telehealth: Payer: Self-pay | Admitting: Internal Medicine

## 2015-02-07 DIAGNOSIS — N186 End stage renal disease: Secondary | ICD-10-CM | POA: Diagnosis not present

## 2015-02-07 DIAGNOSIS — D631 Anemia in chronic kidney disease: Secondary | ICD-10-CM | POA: Diagnosis not present

## 2015-02-07 DIAGNOSIS — D509 Iron deficiency anemia, unspecified: Secondary | ICD-10-CM | POA: Diagnosis not present

## 2015-02-07 DIAGNOSIS — N2581 Secondary hyperparathyroidism of renal origin: Secondary | ICD-10-CM | POA: Diagnosis not present

## 2015-02-07 NOTE — Telephone Encounter (Signed)
Patient will come in and see Dr. Carlean Purl tomorrow at 3:30

## 2015-02-08 ENCOUNTER — Encounter: Payer: Self-pay | Admitting: Internal Medicine

## 2015-02-08 ENCOUNTER — Ambulatory Visit (INDEPENDENT_AMBULATORY_CARE_PROVIDER_SITE_OTHER): Payer: Medicare Other | Admitting: Internal Medicine

## 2015-02-08 VITALS — BP 122/80 | HR 80 | Ht 63.5 in | Wt 201.0 lb

## 2015-02-08 DIAGNOSIS — R10811 Right upper quadrant abdominal tenderness: Secondary | ICD-10-CM | POA: Diagnosis not present

## 2015-02-08 DIAGNOSIS — I878 Other specified disorders of veins: Secondary | ICD-10-CM | POA: Insufficient documentation

## 2015-02-08 DIAGNOSIS — R1013 Epigastric pain: Secondary | ICD-10-CM

## 2015-02-08 DIAGNOSIS — R1011 Right upper quadrant pain: Secondary | ICD-10-CM | POA: Diagnosis not present

## 2015-02-08 MED ORDER — PANTOPRAZOLE SODIUM 20 MG PO TBEC: 20.0000 mg | DELAYED_RELEASE_TABLET | Freq: Every day | ORAL | Status: DC

## 2015-02-08 NOTE — Progress Notes (Signed)
   Subjective:    Patient ID: [name], female    DOB: [birth year], 64 y.o.   MRN: [id number] Cc: abd pain HPI Feels like a bag of wet sand in her RUQ - no periumbilical pain after eating  No changes w/ HD, etc Some pain w/ twisting or lying on side, etc Wants PPI refill  Could not have CT angio abd due to concerns about IV access/infiltration - hemodialysis shunt + hx breast Ca Medications, allergies, past medical history, past surgical history, family history and social history are reviewed and updated in the EMR.  Review of Systems As above    Objective:   Physical Exam BP 122/80 mmHg  Pulse 80  Ht 5' 3.5" (1.613 m)  Wt 201 lb (91.173 kg)  BMI 35.04 kg/m2 Chronically ill NAD Obese abd Soft with tender xiphoid and mild fullness and tenderness in RUQ     Assessment & Plan:  RUQ pain - Plan: CT Abdomen Pelvis Wo Contrast  RUQ abdominal tenderness - Plan: CT Abdomen Pelvis Wo Contrast  Abdominal pain, epigastric - Plan: CT Abdomen Pelvis Wo Contrast  Poor venous access  At this point seems unlikely to be ischemia. Will check CT no IV contrast and doubt further evaluation if negative.   [id number] R, MD

## 2015-02-08 NOTE — Patient Instructions (Addendum)
  We have sent the following medications to your pharmacy for you to pick up at your convenience: Pantoprazole  You have been scheduled for a CT scan of the abdomen and pelvis at [facility] ([address]---this is in the same building as [facility] [employer]).   You are scheduled on 02/13/15 at 11:00AM. You should arrive 15 minutes prior to your appointment time for registration. Please follow the written instructions below on the day of your exam:  WARNING: IF YOU ARE ALLERGIC TO IODINE/X-RAY DYE, PLEASE NOTIFY RADIOLOGY IMMEDIATELY AT [phone]! YOU WILL BE GIVEN A 13 HOUR PREMEDICATION PREP.  1) Do not eat or drink anything after 7:00AM (4 hours prior to your test) 2) You have been given 2 bottles of oral contrast to drink. The solution may taste  better if refrigerated, but do NOT add ice or any other liquid to this solution. Shake  well before drinking.    Drink 1 bottle of contrast @ 9:00AM (2 hours prior to your exam)  Drink 1 bottle of contrast @ 10:00AM (1 hour prior to your exam)  You may take any medications as prescribed with a small amount of water except for the following: Metformin, Glucophage, Glucovance, Avandamet, Riomet, Fortamet, Actoplus Met, Janumet, Glumetza or Metaglip. The above medications must be held the day of the exam AND 48 hours after the exam.  The purpose of you drinking the oral contrast is to aid in the visualization of your intestinal tract. The contrast solution may cause some diarrhea. Before your exam is started, you will be given a small amount of fluid to drink. Depending on your individual set of symptoms, you may also receive an intravenous injection of x-ray contrast/dye. Plan on being at [facility] for 30 minutes or long, depending on the type of exam you are having performed.  This test typically takes 30-45 minutes to complete.  If you have any questions regarding your exam or if you need to reschedule, you may call the CT  department at [phone] between the hours of 8:00 am and 5:00 pm, Monday-Friday.  ________________________________________________________________________  I appreciate the opportunity to care for you. Silvano Rusk, MD, [facility]

## 2015-02-09 ENCOUNTER — Encounter: Payer: Self-pay | Admitting: Internal Medicine

## 2015-02-09 DIAGNOSIS — N186 End stage renal disease: Secondary | ICD-10-CM | POA: Diagnosis not present

## 2015-02-09 DIAGNOSIS — D509 Iron deficiency anemia, unspecified: Secondary | ICD-10-CM | POA: Diagnosis not present

## 2015-02-09 DIAGNOSIS — N2581 Secondary hyperparathyroidism of renal origin: Secondary | ICD-10-CM | POA: Diagnosis not present

## 2015-02-09 DIAGNOSIS — D631 Anemia in chronic kidney disease: Secondary | ICD-10-CM | POA: Diagnosis not present

## 2015-02-12 DIAGNOSIS — N2581 Secondary hyperparathyroidism of renal origin: Secondary | ICD-10-CM | POA: Diagnosis not present

## 2015-02-12 DIAGNOSIS — D509 Iron deficiency anemia, unspecified: Secondary | ICD-10-CM | POA: Diagnosis not present

## 2015-02-12 DIAGNOSIS — D631 Anemia in chronic kidney disease: Secondary | ICD-10-CM | POA: Diagnosis not present

## 2015-02-12 DIAGNOSIS — N186 End stage renal disease: Secondary | ICD-10-CM | POA: Diagnosis not present

## 2015-02-13 ENCOUNTER — Ambulatory Visit (INDEPENDENT_AMBULATORY_CARE_PROVIDER_SITE_OTHER)
Admission: RE | Admit: 2015-02-13 | Discharge: 2015-02-13 | Disposition: A | Discharge status: Home / Self Care | Payer: Medicare Other | Source: Ambulatory Visit | Attending: Internal Medicine | Admitting: Internal Medicine

## 2015-02-13 DIAGNOSIS — K7689 Other specified diseases of liver: Secondary | ICD-10-CM | POA: Diagnosis not present

## 2015-02-13 DIAGNOSIS — R1011 Right upper quadrant pain: Secondary | ICD-10-CM | POA: Diagnosis not present

## 2015-02-13 DIAGNOSIS — R10811 Right upper quadrant abdominal tenderness: Secondary | ICD-10-CM

## 2015-02-13 DIAGNOSIS — R1013 Epigastric pain: Secondary | ICD-10-CM | POA: Diagnosis not present

## 2015-02-14 DIAGNOSIS — N186 End stage renal disease: Secondary | ICD-10-CM | POA: Diagnosis not present

## 2015-02-14 DIAGNOSIS — N2581 Secondary hyperparathyroidism of renal origin: Secondary | ICD-10-CM | POA: Diagnosis not present

## 2015-02-14 DIAGNOSIS — D509 Iron deficiency anemia, unspecified: Secondary | ICD-10-CM | POA: Diagnosis not present

## 2015-02-14 DIAGNOSIS — D631 Anemia in chronic kidney disease: Secondary | ICD-10-CM | POA: Diagnosis not present

## 2015-02-14 NOTE — Progress Notes (Signed)
Quick Note:  I think we should have her get MR liver without and with contrast BUT need to find out if she can get a gadolinium injection in a vein above the hand or in a hand vein - she was set up for a CT w/ contrast but could not get that due to told by oncology not to have injection above hand and CT contrast cannot go through hand due to extravasation risk  Maybe gadolinium is ok to inject in hand Only has left UE to use because of HD shunt on right  If cannot do with contrast just do MR w/o  Dx is liver lesion on CT ______

## 2015-02-16 DIAGNOSIS — D631 Anemia in chronic kidney disease: Secondary | ICD-10-CM | POA: Diagnosis not present

## 2015-02-16 DIAGNOSIS — D509 Iron deficiency anemia, unspecified: Secondary | ICD-10-CM | POA: Diagnosis not present

## 2015-02-16 DIAGNOSIS — N2581 Secondary hyperparathyroidism of renal origin: Secondary | ICD-10-CM | POA: Diagnosis not present

## 2015-02-16 DIAGNOSIS — N186 End stage renal disease: Secondary | ICD-10-CM | POA: Diagnosis not present

## 2015-02-18 DIAGNOSIS — N186 End stage renal disease: Secondary | ICD-10-CM | POA: Diagnosis not present

## 2015-02-18 DIAGNOSIS — E1129 Type 2 diabetes mellitus with other diabetic kidney complication: Secondary | ICD-10-CM | POA: Diagnosis not present

## 2015-02-18 DIAGNOSIS — Z992 Dependence on renal dialysis: Secondary | ICD-10-CM | POA: Diagnosis not present

## 2015-02-19 ENCOUNTER — Other Ambulatory Visit: Payer: Self-pay

## 2015-02-19 DIAGNOSIS — D509 Iron deficiency anemia, unspecified: Secondary | ICD-10-CM | POA: Diagnosis not present

## 2015-02-19 DIAGNOSIS — N186 End stage renal disease: Secondary | ICD-10-CM | POA: Diagnosis not present

## 2015-02-19 DIAGNOSIS — N2581 Secondary hyperparathyroidism of renal origin: Secondary | ICD-10-CM | POA: Diagnosis not present

## 2015-02-19 DIAGNOSIS — R932 Abnormal findings on diagnostic imaging of liver and biliary tract: Secondary | ICD-10-CM

## 2015-02-19 DIAGNOSIS — D631 Anemia in chronic kidney disease: Secondary | ICD-10-CM | POA: Diagnosis not present

## 2015-02-20 ENCOUNTER — Telehealth: Payer: Self-pay

## 2015-02-20 MED ORDER — ALPRAZOLAM 1 MG PO TABS: ORAL_TABLET | ORAL | Status: DC

## 2015-02-20 NOTE — Telephone Encounter (Signed)
Patient is nervous about MRI tomorrow.  Per Dr. Deatra Ina 1 mg xanax 1 hour prior to MRI.  Patient notified and it was called in

## 2015-02-21 DIAGNOSIS — N186 End stage renal disease: Secondary | ICD-10-CM | POA: Diagnosis not present

## 2015-02-21 DIAGNOSIS — D631 Anemia in chronic kidney disease: Secondary | ICD-10-CM | POA: Diagnosis not present

## 2015-02-21 DIAGNOSIS — D509 Iron deficiency anemia, unspecified: Secondary | ICD-10-CM | POA: Diagnosis not present

## 2015-02-21 DIAGNOSIS — N2581 Secondary hyperparathyroidism of renal origin: Secondary | ICD-10-CM | POA: Diagnosis not present

## 2015-02-22 ENCOUNTER — Other Ambulatory Visit: Payer: Self-pay | Admitting: Internal Medicine

## 2015-02-22 ENCOUNTER — Ambulatory Visit (HOSPITAL_COMMUNITY)
Admission: RE | Admit: 2015-02-22 | Discharge: 2015-02-22 | Disposition: A | Discharge status: Home / Self Care | Payer: Medicare Other | Source: Ambulatory Visit | Attending: Internal Medicine | Admitting: Internal Medicine

## 2015-02-22 DIAGNOSIS — J90 Pleural effusion, not elsewhere classified: Secondary | ICD-10-CM | POA: Insufficient documentation

## 2015-02-22 DIAGNOSIS — R932 Abnormal findings on diagnostic imaging of liver and biliary tract: Secondary | ICD-10-CM

## 2015-02-22 DIAGNOSIS — Z853 Personal history of malignant neoplasm of breast: Secondary | ICD-10-CM | POA: Diagnosis not present

## 2015-02-22 DIAGNOSIS — Z992 Dependence on renal dialysis: Secondary | ICD-10-CM | POA: Insufficient documentation

## 2015-02-22 DIAGNOSIS — K769 Liver disease, unspecified: Secondary | ICD-10-CM | POA: Diagnosis not present

## 2015-02-22 DIAGNOSIS — Z79899 Other long term (current) drug therapy: Secondary | ICD-10-CM | POA: Diagnosis not present

## 2015-02-22 DIAGNOSIS — R188 Other ascites: Secondary | ICD-10-CM | POA: Insufficient documentation

## 2015-02-22 DIAGNOSIS — N186 End stage renal disease: Secondary | ICD-10-CM | POA: Insufficient documentation

## 2015-02-22 DIAGNOSIS — H3532 Exudative age-related macular degeneration: Secondary | ICD-10-CM | POA: Diagnosis not present

## 2015-02-22 DIAGNOSIS — I517 Cardiomegaly: Secondary | ICD-10-CM | POA: Insufficient documentation

## 2015-02-22 LAB — GLUCOSE, CAPILLARY: Glucose-Capillary: 159 mg/dL — ABNORMAL HIGH (ref 65–99)

## 2015-02-22 NOTE — Progress Notes (Signed)
Quick Note:  There is an abnormality ion the liver of unclear cause Next step is to have ger do an AFP re: liver lesion I will review that and explain more to her when I return ______

## 2015-02-23 ENCOUNTER — Other Ambulatory Visit: Payer: Self-pay

## 2015-02-23 DIAGNOSIS — D631 Anemia in chronic kidney disease: Secondary | ICD-10-CM | POA: Diagnosis not present

## 2015-02-23 DIAGNOSIS — N186 End stage renal disease: Secondary | ICD-10-CM | POA: Diagnosis not present

## 2015-02-23 DIAGNOSIS — D509 Iron deficiency anemia, unspecified: Secondary | ICD-10-CM | POA: Diagnosis not present

## 2015-02-23 DIAGNOSIS — N2581 Secondary hyperparathyroidism of renal origin: Secondary | ICD-10-CM | POA: Diagnosis not present

## 2015-02-23 DIAGNOSIS — R160 Hepatomegaly, not elsewhere classified: Secondary | ICD-10-CM

## 2015-02-26 DIAGNOSIS — N186 End stage renal disease: Secondary | ICD-10-CM | POA: Diagnosis not present

## 2015-02-26 DIAGNOSIS — D509 Iron deficiency anemia, unspecified: Secondary | ICD-10-CM | POA: Diagnosis not present

## 2015-02-26 DIAGNOSIS — D631 Anemia in chronic kidney disease: Secondary | ICD-10-CM | POA: Diagnosis not present

## 2015-02-26 DIAGNOSIS — N2581 Secondary hyperparathyroidism of renal origin: Secondary | ICD-10-CM | POA: Diagnosis not present

## 2015-02-27 DIAGNOSIS — B373 Candidiasis of vulva and vagina: Secondary | ICD-10-CM | POA: Diagnosis not present

## 2015-02-28 DIAGNOSIS — D509 Iron deficiency anemia, unspecified: Secondary | ICD-10-CM | POA: Diagnosis not present

## 2015-02-28 DIAGNOSIS — N2581 Secondary hyperparathyroidism of renal origin: Secondary | ICD-10-CM | POA: Diagnosis not present

## 2015-02-28 DIAGNOSIS — N186 End stage renal disease: Secondary | ICD-10-CM | POA: Diagnosis not present

## 2015-02-28 DIAGNOSIS — D631 Anemia in chronic kidney disease: Secondary | ICD-10-CM | POA: Diagnosis not present

## 2015-03-01 DIAGNOSIS — N186 End stage renal disease: Secondary | ICD-10-CM | POA: Diagnosis not present

## 2015-03-01 DIAGNOSIS — Z6833 Body mass index (BMI) 33.0-33.9, adult: Secondary | ICD-10-CM | POA: Diagnosis not present

## 2015-03-01 DIAGNOSIS — E1129 Type 2 diabetes mellitus with other diabetic kidney complication: Secondary | ICD-10-CM | POA: Diagnosis not present

## 2015-03-01 DIAGNOSIS — I10 Essential (primary) hypertension: Secondary | ICD-10-CM | POA: Diagnosis not present

## 2015-03-02 ENCOUNTER — Telehealth: Payer: Self-pay | Admitting: Internal Medicine

## 2015-03-02 DIAGNOSIS — D631 Anemia in chronic kidney disease: Secondary | ICD-10-CM | POA: Diagnosis not present

## 2015-03-02 DIAGNOSIS — N186 End stage renal disease: Secondary | ICD-10-CM | POA: Diagnosis not present

## 2015-03-02 DIAGNOSIS — N2581 Secondary hyperparathyroidism of renal origin: Secondary | ICD-10-CM | POA: Diagnosis not present

## 2015-03-02 DIAGNOSIS — D509 Iron deficiency anemia, unspecified: Secondary | ICD-10-CM | POA: Diagnosis not present

## 2015-03-02 NOTE — Telephone Encounter (Signed)
Patient is supposed to come for an AFP for abnormal liver MRI.  She is reminded to come for labs then we will contact her about the next step

## 2015-03-03 ENCOUNTER — Other Ambulatory Visit: Payer: Self-pay | Admitting: Internal Medicine

## 2015-03-05 DIAGNOSIS — N186 End stage renal disease: Secondary | ICD-10-CM | POA: Diagnosis not present

## 2015-03-05 DIAGNOSIS — D631 Anemia in chronic kidney disease: Secondary | ICD-10-CM | POA: Diagnosis not present

## 2015-03-05 DIAGNOSIS — N2581 Secondary hyperparathyroidism of renal origin: Secondary | ICD-10-CM | POA: Diagnosis not present

## 2015-03-05 DIAGNOSIS — D509 Iron deficiency anemia, unspecified: Secondary | ICD-10-CM | POA: Diagnosis not present

## 2015-03-06 ENCOUNTER — Other Ambulatory Visit: Payer: Medicare Other

## 2015-03-06 DIAGNOSIS — K769 Liver disease, unspecified: Secondary | ICD-10-CM | POA: Diagnosis not present

## 2015-03-06 DIAGNOSIS — R932 Abnormal findings on diagnostic imaging of liver and biliary tract: Secondary | ICD-10-CM | POA: Diagnosis not present

## 2015-03-06 DIAGNOSIS — R160 Hepatomegaly, not elsewhere classified: Secondary | ICD-10-CM | POA: Diagnosis not present

## 2015-03-07 DIAGNOSIS — N2581 Secondary hyperparathyroidism of renal origin: Secondary | ICD-10-CM | POA: Diagnosis not present

## 2015-03-07 DIAGNOSIS — D509 Iron deficiency anemia, unspecified: Secondary | ICD-10-CM | POA: Diagnosis not present

## 2015-03-07 DIAGNOSIS — N186 End stage renal disease: Secondary | ICD-10-CM | POA: Diagnosis not present

## 2015-03-07 DIAGNOSIS — D631 Anemia in chronic kidney disease: Secondary | ICD-10-CM | POA: Diagnosis not present

## 2015-03-07 LAB — AFP TUMOR MARKER: AFP-Tumor Marker: 2.9 ng/mL (ref <6.1)

## 2015-03-09 ENCOUNTER — Telehealth: Payer: Self-pay | Admitting: Internal Medicine

## 2015-03-09 DIAGNOSIS — D509 Iron deficiency anemia, unspecified: Secondary | ICD-10-CM | POA: Diagnosis not present

## 2015-03-09 DIAGNOSIS — D631 Anemia in chronic kidney disease: Secondary | ICD-10-CM | POA: Diagnosis not present

## 2015-03-09 DIAGNOSIS — N186 End stage renal disease: Secondary | ICD-10-CM | POA: Diagnosis not present

## 2015-03-09 DIAGNOSIS — N2581 Secondary hyperparathyroidism of renal origin: Secondary | ICD-10-CM | POA: Diagnosis not present

## 2015-03-09 NOTE — Telephone Encounter (Signed)
Patient advised that the AFP was normal and I will call next week when Dr. Carlean Purl has had a chance to review and advise

## 2015-03-12 DIAGNOSIS — N2581 Secondary hyperparathyroidism of renal origin: Secondary | ICD-10-CM | POA: Diagnosis not present

## 2015-03-12 DIAGNOSIS — D631 Anemia in chronic kidney disease: Secondary | ICD-10-CM | POA: Diagnosis not present

## 2015-03-12 DIAGNOSIS — N186 End stage renal disease: Secondary | ICD-10-CM | POA: Diagnosis not present

## 2015-03-12 DIAGNOSIS — D509 Iron deficiency anemia, unspecified: Secondary | ICD-10-CM | POA: Diagnosis not present

## 2015-03-13 DIAGNOSIS — H3500 Unspecified background retinopathy: Secondary | ICD-10-CM | POA: Diagnosis not present

## 2015-03-13 DIAGNOSIS — Z6833 Body mass index (BMI) 33.0-33.9, adult: Secondary | ICD-10-CM | POA: Diagnosis not present

## 2015-03-13 DIAGNOSIS — N186 End stage renal disease: Secondary | ICD-10-CM | POA: Diagnosis not present

## 2015-03-13 DIAGNOSIS — R42 Dizziness and giddiness: Secondary | ICD-10-CM | POA: Diagnosis not present

## 2015-03-13 DIAGNOSIS — M79606 Pain in leg, unspecified: Secondary | ICD-10-CM | POA: Diagnosis not present

## 2015-03-13 NOTE — Progress Notes (Signed)
Quick Note:  Message left that I would call back I did not understand that could not have gadolinium in ESRD Need to see if we can have her get proximal IV placed by IR then get the CT same day ______

## 2015-03-14 DIAGNOSIS — D631 Anemia in chronic kidney disease: Secondary | ICD-10-CM | POA: Diagnosis not present

## 2015-03-14 DIAGNOSIS — D509 Iron deficiency anemia, unspecified: Secondary | ICD-10-CM | POA: Diagnosis not present

## 2015-03-14 DIAGNOSIS — N2581 Secondary hyperparathyroidism of renal origin: Secondary | ICD-10-CM | POA: Diagnosis not present

## 2015-03-14 DIAGNOSIS — N186 End stage renal disease: Secondary | ICD-10-CM | POA: Diagnosis not present

## 2015-03-15 DIAGNOSIS — R9439 Abnormal result of other cardiovascular function study: Secondary | ICD-10-CM | POA: Insufficient documentation

## 2015-03-16 DIAGNOSIS — N2581 Secondary hyperparathyroidism of renal origin: Secondary | ICD-10-CM | POA: Diagnosis not present

## 2015-03-16 DIAGNOSIS — D631 Anemia in chronic kidney disease: Secondary | ICD-10-CM | POA: Diagnosis not present

## 2015-03-16 DIAGNOSIS — N186 End stage renal disease: Secondary | ICD-10-CM | POA: Diagnosis not present

## 2015-03-16 DIAGNOSIS — D509 Iron deficiency anemia, unspecified: Secondary | ICD-10-CM | POA: Diagnosis not present

## 2015-03-19 DIAGNOSIS — N2581 Secondary hyperparathyroidism of renal origin: Secondary | ICD-10-CM | POA: Diagnosis not present

## 2015-03-19 DIAGNOSIS — D631 Anemia in chronic kidney disease: Secondary | ICD-10-CM | POA: Diagnosis not present

## 2015-03-19 DIAGNOSIS — D509 Iron deficiency anemia, unspecified: Secondary | ICD-10-CM | POA: Diagnosis not present

## 2015-03-19 DIAGNOSIS — N186 End stage renal disease: Secondary | ICD-10-CM | POA: Diagnosis not present

## 2015-03-19 NOTE — Progress Notes (Signed)
Quick Note:  Left another message that we would call  Please arrange for: 1) IR to place deep IV using US guidance followed by 2) hepatic protocol CT abd with and without contrast evaluate liver lesion same day - [facility] or [facility]  She is ESRD on HD - cannot do gadlinium and only NL IV is hand and cannot do IV contrast in hand   ______

## 2015-03-20 ENCOUNTER — Telehealth: Payer: Self-pay

## 2015-03-20 DIAGNOSIS — K769 Liver disease, unspecified: Secondary | ICD-10-CM

## 2015-03-21 DIAGNOSIS — N2581 Secondary hyperparathyroidism of renal origin: Secondary | ICD-10-CM | POA: Diagnosis not present

## 2015-03-21 DIAGNOSIS — D631 Anemia in chronic kidney disease: Secondary | ICD-10-CM | POA: Diagnosis not present

## 2015-03-21 DIAGNOSIS — Z992 Dependence on renal dialysis: Secondary | ICD-10-CM | POA: Diagnosis not present

## 2015-03-21 DIAGNOSIS — D509 Iron deficiency anemia, unspecified: Secondary | ICD-10-CM | POA: Diagnosis not present

## 2015-03-21 DIAGNOSIS — E1129 Type 2 diabetes mellitus with other diabetic kidney complication: Secondary | ICD-10-CM | POA: Diagnosis not present

## 2015-03-21 DIAGNOSIS — N186 End stage renal disease: Secondary | ICD-10-CM | POA: Diagnosis not present

## 2015-03-22 ENCOUNTER — Telehealth: Payer: Self-pay | Admitting: Internal Medicine

## 2015-03-22 NOTE — Telephone Encounter (Signed)
I faxed a copy of labs from [facility] to [place] at [phone].  BUN 52 Creatinine 8.8 from 03/14/15

## 2015-03-22 NOTE — Telephone Encounter (Signed)
I spoke with Tedra Coupe and Jinny Blossom at [facility] radiology.  Despite being a ESRD on dialysis they require she have a BUN and creatinine prior to the CT on 03/29/15.  I spoke with Fabio Bering at Dialysis center, she will fax me a copy of labs from 03/14/15.

## 2015-03-22 NOTE — Telephone Encounter (Signed)
I spoke with Tedra Coupe and [place] at [facility]

## 2015-03-23 DIAGNOSIS — D509 Iron deficiency anemia, unspecified: Secondary | ICD-10-CM | POA: Diagnosis not present

## 2015-03-23 DIAGNOSIS — N186 End stage renal disease: Secondary | ICD-10-CM | POA: Diagnosis not present

## 2015-03-23 DIAGNOSIS — E1129 Type 2 diabetes mellitus with other diabetic kidney complication: Secondary | ICD-10-CM | POA: Diagnosis not present

## 2015-03-23 DIAGNOSIS — D631 Anemia in chronic kidney disease: Secondary | ICD-10-CM | POA: Diagnosis not present

## 2015-03-23 DIAGNOSIS — Z23 Encounter for immunization: Secondary | ICD-10-CM | POA: Diagnosis not present

## 2015-03-23 DIAGNOSIS — N2581 Secondary hyperparathyroidism of renal origin: Secondary | ICD-10-CM | POA: Diagnosis not present

## 2015-03-26 DIAGNOSIS — Z23 Encounter for immunization: Secondary | ICD-10-CM | POA: Diagnosis not present

## 2015-03-26 DIAGNOSIS — E1129 Type 2 diabetes mellitus with other diabetic kidney complication: Secondary | ICD-10-CM | POA: Diagnosis not present

## 2015-03-26 DIAGNOSIS — D509 Iron deficiency anemia, unspecified: Secondary | ICD-10-CM | POA: Diagnosis not present

## 2015-03-26 DIAGNOSIS — N2581 Secondary hyperparathyroidism of renal origin: Secondary | ICD-10-CM | POA: Diagnosis not present

## 2015-03-26 DIAGNOSIS — N186 End stage renal disease: Secondary | ICD-10-CM | POA: Diagnosis not present

## 2015-03-26 DIAGNOSIS — D631 Anemia in chronic kidney disease: Secondary | ICD-10-CM | POA: Diagnosis not present

## 2015-03-27 DIAGNOSIS — I517 Cardiomegaly: Secondary | ICD-10-CM | POA: Diagnosis not present

## 2015-03-27 DIAGNOSIS — R9439 Abnormal result of other cardiovascular function study: Secondary | ICD-10-CM | POA: Diagnosis not present

## 2015-03-28 DIAGNOSIS — E1129 Type 2 diabetes mellitus with other diabetic kidney complication: Secondary | ICD-10-CM | POA: Diagnosis not present

## 2015-03-28 DIAGNOSIS — N2581 Secondary hyperparathyroidism of renal origin: Secondary | ICD-10-CM | POA: Diagnosis not present

## 2015-03-28 DIAGNOSIS — D631 Anemia in chronic kidney disease: Secondary | ICD-10-CM | POA: Diagnosis not present

## 2015-03-28 DIAGNOSIS — D509 Iron deficiency anemia, unspecified: Secondary | ICD-10-CM | POA: Diagnosis not present

## 2015-03-28 DIAGNOSIS — Z23 Encounter for immunization: Secondary | ICD-10-CM | POA: Diagnosis not present

## 2015-03-28 DIAGNOSIS — N186 End stage renal disease: Secondary | ICD-10-CM | POA: Diagnosis not present

## 2015-03-29 ENCOUNTER — Ambulatory Visit (HOSPITAL_COMMUNITY): Admission: RE | Admit: 2015-03-29 | Payer: Medicare Other | Source: Ambulatory Visit

## 2015-03-29 ENCOUNTER — Other Ambulatory Visit (HOSPITAL_COMMUNITY): Payer: Medicare Other

## 2015-03-29 DIAGNOSIS — E8779 Other fluid overload: Secondary | ICD-10-CM | POA: Diagnosis not present

## 2015-03-29 DIAGNOSIS — N186 End stage renal disease: Secondary | ICD-10-CM | POA: Diagnosis not present

## 2015-03-30 DIAGNOSIS — D509 Iron deficiency anemia, unspecified: Secondary | ICD-10-CM | POA: Diagnosis not present

## 2015-03-30 DIAGNOSIS — D631 Anemia in chronic kidney disease: Secondary | ICD-10-CM | POA: Diagnosis not present

## 2015-03-30 DIAGNOSIS — Z23 Encounter for immunization: Secondary | ICD-10-CM | POA: Diagnosis not present

## 2015-03-30 DIAGNOSIS — E1129 Type 2 diabetes mellitus with other diabetic kidney complication: Secondary | ICD-10-CM | POA: Diagnosis not present

## 2015-03-30 DIAGNOSIS — N186 End stage renal disease: Secondary | ICD-10-CM | POA: Diagnosis not present

## 2015-03-30 DIAGNOSIS — N2581 Secondary hyperparathyroidism of renal origin: Secondary | ICD-10-CM | POA: Diagnosis not present

## 2015-04-02 DIAGNOSIS — D509 Iron deficiency anemia, unspecified: Secondary | ICD-10-CM | POA: Diagnosis not present

## 2015-04-02 DIAGNOSIS — N2581 Secondary hyperparathyroidism of renal origin: Secondary | ICD-10-CM | POA: Diagnosis not present

## 2015-04-02 DIAGNOSIS — E1129 Type 2 diabetes mellitus with other diabetic kidney complication: Secondary | ICD-10-CM | POA: Diagnosis not present

## 2015-04-02 DIAGNOSIS — Z23 Encounter for immunization: Secondary | ICD-10-CM | POA: Diagnosis not present

## 2015-04-02 DIAGNOSIS — D631 Anemia in chronic kidney disease: Secondary | ICD-10-CM | POA: Diagnosis not present

## 2015-04-02 DIAGNOSIS — N186 End stage renal disease: Secondary | ICD-10-CM | POA: Diagnosis not present

## 2015-04-03 ENCOUNTER — Telehealth: Payer: Self-pay | Admitting: Internal Medicine

## 2015-04-03 NOTE — Telephone Encounter (Signed)
I obtained anther copy of the labs and faxed them to [facility].

## 2015-04-04 DIAGNOSIS — D509 Iron deficiency anemia, unspecified: Secondary | ICD-10-CM | POA: Diagnosis not present

## 2015-04-04 DIAGNOSIS — N2581 Secondary hyperparathyroidism of renal origin: Secondary | ICD-10-CM | POA: Diagnosis not present

## 2015-04-04 DIAGNOSIS — E1129 Type 2 diabetes mellitus with other diabetic kidney complication: Secondary | ICD-10-CM | POA: Diagnosis not present

## 2015-04-04 DIAGNOSIS — N186 End stage renal disease: Secondary | ICD-10-CM | POA: Diagnosis not present

## 2015-04-04 DIAGNOSIS — D631 Anemia in chronic kidney disease: Secondary | ICD-10-CM | POA: Diagnosis not present

## 2015-04-04 DIAGNOSIS — Z23 Encounter for immunization: Secondary | ICD-10-CM | POA: Diagnosis not present

## 2015-04-05 ENCOUNTER — Ambulatory Visit (HOSPITAL_COMMUNITY)
Admission: RE | Admit: 2015-04-05 | Discharge: 2015-04-05 | Disposition: A | Discharge status: Home / Self Care | Payer: Medicare Other | Source: Ambulatory Visit | Attending: Internal Medicine | Admitting: Internal Medicine

## 2015-04-05 ENCOUNTER — Other Ambulatory Visit: Payer: Self-pay | Admitting: Internal Medicine

## 2015-04-05 DIAGNOSIS — Z9012 Acquired absence of left breast and nipple: Secondary | ICD-10-CM | POA: Diagnosis not present

## 2015-04-05 DIAGNOSIS — N186 End stage renal disease: Secondary | ICD-10-CM | POA: Diagnosis not present

## 2015-04-05 DIAGNOSIS — Z992 Dependence on renal dialysis: Secondary | ICD-10-CM | POA: Diagnosis not present

## 2015-04-05 DIAGNOSIS — K769 Liver disease, unspecified: Secondary | ICD-10-CM | POA: Insufficient documentation

## 2015-04-05 DIAGNOSIS — Z452 Encounter for adjustment and management of vascular access device: Secondary | ICD-10-CM | POA: Diagnosis not present

## 2015-04-05 DIAGNOSIS — R109 Unspecified abdominal pain: Secondary | ICD-10-CM | POA: Diagnosis not present

## 2015-04-05 MED ORDER — LIDOCAINE HCL 1 % IJ SOLN
INTRAMUSCULAR | Status: AC
  Filled 2015-04-05: qty 20

## 2015-04-05 MED ORDER — IOHEXOL 300 MG/ML  SOLN
100.0000 mL | Freq: Once | INTRAMUSCULAR | Status: AC | PRN
  Administered 2015-04-05: 100 mL via INTRAVENOUS

## 2015-04-05 NOTE — Procedures (Signed)
R IV placed under [place] No complication No blood loss. See complete dictation in [facility].

## 2015-04-06 DIAGNOSIS — D509 Iron deficiency anemia, unspecified: Secondary | ICD-10-CM | POA: Diagnosis not present

## 2015-04-06 DIAGNOSIS — Z23 Encounter for immunization: Secondary | ICD-10-CM | POA: Diagnosis not present

## 2015-04-06 DIAGNOSIS — N186 End stage renal disease: Secondary | ICD-10-CM | POA: Diagnosis not present

## 2015-04-06 DIAGNOSIS — E1129 Type 2 diabetes mellitus with other diabetic kidney complication: Secondary | ICD-10-CM | POA: Diagnosis not present

## 2015-04-06 DIAGNOSIS — D631 Anemia in chronic kidney disease: Secondary | ICD-10-CM | POA: Diagnosis not present

## 2015-04-06 DIAGNOSIS — N2581 Secondary hyperparathyroidism of renal origin: Secondary | ICD-10-CM | POA: Diagnosis not present

## 2015-04-06 NOTE — Progress Notes (Signed)
Quick Note:  Good news is the lesion is not growing which goes against cancer Bad news is they still are not sure what it is I need to review further and will get back to her Please cc her transplant team at [facility] ______

## 2015-04-09 DIAGNOSIS — D509 Iron deficiency anemia, unspecified: Secondary | ICD-10-CM | POA: Diagnosis not present

## 2015-04-09 DIAGNOSIS — N186 End stage renal disease: Secondary | ICD-10-CM | POA: Diagnosis not present

## 2015-04-09 DIAGNOSIS — N2581 Secondary hyperparathyroidism of renal origin: Secondary | ICD-10-CM | POA: Diagnosis not present

## 2015-04-09 DIAGNOSIS — D631 Anemia in chronic kidney disease: Secondary | ICD-10-CM | POA: Diagnosis not present

## 2015-04-09 DIAGNOSIS — E1129 Type 2 diabetes mellitus with other diabetic kidney complication: Secondary | ICD-10-CM | POA: Diagnosis not present

## 2015-04-09 DIAGNOSIS — Z23 Encounter for immunization: Secondary | ICD-10-CM | POA: Diagnosis not present

## 2015-04-11 DIAGNOSIS — Z23 Encounter for immunization: Secondary | ICD-10-CM | POA: Diagnosis not present

## 2015-04-11 DIAGNOSIS — D509 Iron deficiency anemia, unspecified: Secondary | ICD-10-CM | POA: Diagnosis not present

## 2015-04-11 DIAGNOSIS — D631 Anemia in chronic kidney disease: Secondary | ICD-10-CM | POA: Diagnosis not present

## 2015-04-11 DIAGNOSIS — N186 End stage renal disease: Secondary | ICD-10-CM | POA: Diagnosis not present

## 2015-04-11 DIAGNOSIS — N2581 Secondary hyperparathyroidism of renal origin: Secondary | ICD-10-CM | POA: Diagnosis not present

## 2015-04-11 DIAGNOSIS — E1129 Type 2 diabetes mellitus with other diabetic kidney complication: Secondary | ICD-10-CM | POA: Diagnosis not present

## 2015-04-13 DIAGNOSIS — E1129 Type 2 diabetes mellitus with other diabetic kidney complication: Secondary | ICD-10-CM | POA: Diagnosis not present

## 2015-04-13 DIAGNOSIS — D631 Anemia in chronic kidney disease: Secondary | ICD-10-CM | POA: Diagnosis not present

## 2015-04-13 DIAGNOSIS — N186 End stage renal disease: Secondary | ICD-10-CM | POA: Diagnosis not present

## 2015-04-13 DIAGNOSIS — D509 Iron deficiency anemia, unspecified: Secondary | ICD-10-CM | POA: Diagnosis not present

## 2015-04-13 DIAGNOSIS — N2581 Secondary hyperparathyroidism of renal origin: Secondary | ICD-10-CM | POA: Diagnosis not present

## 2015-04-13 DIAGNOSIS — Z23 Encounter for immunization: Secondary | ICD-10-CM | POA: Diagnosis not present

## 2015-04-16 DIAGNOSIS — D509 Iron deficiency anemia, unspecified: Secondary | ICD-10-CM | POA: Diagnosis not present

## 2015-04-16 DIAGNOSIS — E1129 Type 2 diabetes mellitus with other diabetic kidney complication: Secondary | ICD-10-CM | POA: Diagnosis not present

## 2015-04-16 DIAGNOSIS — N186 End stage renal disease: Secondary | ICD-10-CM | POA: Diagnosis not present

## 2015-04-16 DIAGNOSIS — D631 Anemia in chronic kidney disease: Secondary | ICD-10-CM | POA: Diagnosis not present

## 2015-04-16 DIAGNOSIS — Z23 Encounter for immunization: Secondary | ICD-10-CM | POA: Diagnosis not present

## 2015-04-16 DIAGNOSIS — N2581 Secondary hyperparathyroidism of renal origin: Secondary | ICD-10-CM | POA: Diagnosis not present

## 2015-04-18 DIAGNOSIS — E1129 Type 2 diabetes mellitus with other diabetic kidney complication: Secondary | ICD-10-CM | POA: Diagnosis not present

## 2015-04-18 DIAGNOSIS — D631 Anemia in chronic kidney disease: Secondary | ICD-10-CM | POA: Diagnosis not present

## 2015-04-18 DIAGNOSIS — N186 End stage renal disease: Secondary | ICD-10-CM | POA: Diagnosis not present

## 2015-04-18 DIAGNOSIS — Z23 Encounter for immunization: Secondary | ICD-10-CM | POA: Diagnosis not present

## 2015-04-18 DIAGNOSIS — D509 Iron deficiency anemia, unspecified: Secondary | ICD-10-CM | POA: Diagnosis not present

## 2015-04-18 DIAGNOSIS — N2581 Secondary hyperparathyroidism of renal origin: Secondary | ICD-10-CM | POA: Diagnosis not present

## 2015-04-19 DIAGNOSIS — H3532 Exudative age-related macular degeneration: Secondary | ICD-10-CM | POA: Diagnosis not present

## 2015-04-20 DIAGNOSIS — Z992 Dependence on renal dialysis: Secondary | ICD-10-CM | POA: Diagnosis not present

## 2015-04-20 DIAGNOSIS — N186 End stage renal disease: Secondary | ICD-10-CM | POA: Diagnosis not present

## 2015-04-20 DIAGNOSIS — D509 Iron deficiency anemia, unspecified: Secondary | ICD-10-CM | POA: Diagnosis not present

## 2015-04-20 DIAGNOSIS — D631 Anemia in chronic kidney disease: Secondary | ICD-10-CM | POA: Diagnosis not present

## 2015-04-20 DIAGNOSIS — Z23 Encounter for immunization: Secondary | ICD-10-CM | POA: Diagnosis not present

## 2015-04-20 DIAGNOSIS — E1129 Type 2 diabetes mellitus with other diabetic kidney complication: Secondary | ICD-10-CM | POA: Diagnosis not present

## 2015-04-20 DIAGNOSIS — N2581 Secondary hyperparathyroidism of renal origin: Secondary | ICD-10-CM | POA: Diagnosis not present

## 2015-04-23 DIAGNOSIS — D631 Anemia in chronic kidney disease: Secondary | ICD-10-CM | POA: Diagnosis not present

## 2015-04-23 DIAGNOSIS — N2581 Secondary hyperparathyroidism of renal origin: Secondary | ICD-10-CM | POA: Diagnosis not present

## 2015-04-23 DIAGNOSIS — K5792 Diverticulitis of intestine, part unspecified, without perforation or abscess without bleeding: Secondary | ICD-10-CM | POA: Diagnosis not present

## 2015-04-23 DIAGNOSIS — R102 Pelvic and perineal pain: Secondary | ICD-10-CM | POA: Diagnosis not present

## 2015-04-23 DIAGNOSIS — N186 End stage renal disease: Secondary | ICD-10-CM | POA: Diagnosis not present

## 2015-04-23 DIAGNOSIS — M545 Low back pain: Secondary | ICD-10-CM | POA: Diagnosis not present

## 2015-04-23 DIAGNOSIS — E1129 Type 2 diabetes mellitus with other diabetic kidney complication: Secondary | ICD-10-CM | POA: Diagnosis not present

## 2015-04-25 DIAGNOSIS — D631 Anemia in chronic kidney disease: Secondary | ICD-10-CM | POA: Diagnosis not present

## 2015-04-25 DIAGNOSIS — N186 End stage renal disease: Secondary | ICD-10-CM | POA: Diagnosis not present

## 2015-04-25 DIAGNOSIS — N2581 Secondary hyperparathyroidism of renal origin: Secondary | ICD-10-CM | POA: Diagnosis not present

## 2015-04-25 DIAGNOSIS — E1129 Type 2 diabetes mellitus with other diabetic kidney complication: Secondary | ICD-10-CM | POA: Diagnosis not present

## 2015-04-25 NOTE — Progress Notes (Signed)
Quick Note:  Let her know that I reviewed with radiologist We doubt this is cancer or anything bad but that is still a possibility.  We will need to arrange for another CT in 3 months from now - shoe will need the deep IV by IR again and triple phase (hepatic protocol) CT abd to follow-up liver lesion   ______

## 2015-04-27 DIAGNOSIS — N2581 Secondary hyperparathyroidism of renal origin: Secondary | ICD-10-CM | POA: Diagnosis not present

## 2015-04-27 DIAGNOSIS — N186 End stage renal disease: Secondary | ICD-10-CM | POA: Diagnosis not present

## 2015-04-27 DIAGNOSIS — D631 Anemia in chronic kidney disease: Secondary | ICD-10-CM | POA: Diagnosis not present

## 2015-04-27 DIAGNOSIS — E1129 Type 2 diabetes mellitus with other diabetic kidney complication: Secondary | ICD-10-CM | POA: Diagnosis not present

## 2015-04-30 DIAGNOSIS — N186 End stage renal disease: Secondary | ICD-10-CM | POA: Diagnosis not present

## 2015-04-30 DIAGNOSIS — E1129 Type 2 diabetes mellitus with other diabetic kidney complication: Secondary | ICD-10-CM | POA: Diagnosis not present

## 2015-04-30 DIAGNOSIS — D631 Anemia in chronic kidney disease: Secondary | ICD-10-CM | POA: Diagnosis not present

## 2015-04-30 DIAGNOSIS — N2581 Secondary hyperparathyroidism of renal origin: Secondary | ICD-10-CM | POA: Diagnosis not present

## 2015-05-02 DIAGNOSIS — D631 Anemia in chronic kidney disease: Secondary | ICD-10-CM | POA: Diagnosis not present

## 2015-05-02 DIAGNOSIS — N2581 Secondary hyperparathyroidism of renal origin: Secondary | ICD-10-CM | POA: Diagnosis not present

## 2015-05-02 DIAGNOSIS — E1129 Type 2 diabetes mellitus with other diabetic kidney complication: Secondary | ICD-10-CM | POA: Diagnosis not present

## 2015-05-02 DIAGNOSIS — N186 End stage renal disease: Secondary | ICD-10-CM | POA: Diagnosis not present

## 2015-05-04 DIAGNOSIS — N2581 Secondary hyperparathyroidism of renal origin: Secondary | ICD-10-CM | POA: Diagnosis not present

## 2015-05-04 DIAGNOSIS — D631 Anemia in chronic kidney disease: Secondary | ICD-10-CM | POA: Diagnosis not present

## 2015-05-04 DIAGNOSIS — E1129 Type 2 diabetes mellitus with other diabetic kidney complication: Secondary | ICD-10-CM | POA: Diagnosis not present

## 2015-05-04 DIAGNOSIS — N186 End stage renal disease: Secondary | ICD-10-CM | POA: Diagnosis not present

## 2015-05-07 DIAGNOSIS — E1129 Type 2 diabetes mellitus with other diabetic kidney complication: Secondary | ICD-10-CM | POA: Diagnosis not present

## 2015-05-07 DIAGNOSIS — N186 End stage renal disease: Secondary | ICD-10-CM | POA: Diagnosis not present

## 2015-05-07 DIAGNOSIS — N2581 Secondary hyperparathyroidism of renal origin: Secondary | ICD-10-CM | POA: Diagnosis not present

## 2015-05-07 DIAGNOSIS — D631 Anemia in chronic kidney disease: Secondary | ICD-10-CM | POA: Diagnosis not present

## 2015-05-09 DIAGNOSIS — N2581 Secondary hyperparathyroidism of renal origin: Secondary | ICD-10-CM | POA: Diagnosis not present

## 2015-05-09 DIAGNOSIS — N186 End stage renal disease: Secondary | ICD-10-CM | POA: Diagnosis not present

## 2015-05-09 DIAGNOSIS — D631 Anemia in chronic kidney disease: Secondary | ICD-10-CM | POA: Diagnosis not present

## 2015-05-09 DIAGNOSIS — E1129 Type 2 diabetes mellitus with other diabetic kidney complication: Secondary | ICD-10-CM | POA: Diagnosis not present

## 2015-05-11 DIAGNOSIS — E1129 Type 2 diabetes mellitus with other diabetic kidney complication: Secondary | ICD-10-CM | POA: Diagnosis not present

## 2015-05-11 DIAGNOSIS — D631 Anemia in chronic kidney disease: Secondary | ICD-10-CM | POA: Diagnosis not present

## 2015-05-11 DIAGNOSIS — N2581 Secondary hyperparathyroidism of renal origin: Secondary | ICD-10-CM | POA: Diagnosis not present

## 2015-05-11 DIAGNOSIS — N186 End stage renal disease: Secondary | ICD-10-CM | POA: Diagnosis not present

## 2015-05-14 DIAGNOSIS — N2581 Secondary hyperparathyroidism of renal origin: Secondary | ICD-10-CM | POA: Diagnosis not present

## 2015-05-14 DIAGNOSIS — D631 Anemia in chronic kidney disease: Secondary | ICD-10-CM | POA: Diagnosis not present

## 2015-05-14 DIAGNOSIS — E1129 Type 2 diabetes mellitus with other diabetic kidney complication: Secondary | ICD-10-CM | POA: Diagnosis not present

## 2015-05-14 DIAGNOSIS — N186 End stage renal disease: Secondary | ICD-10-CM | POA: Diagnosis not present

## 2015-05-16 DIAGNOSIS — N186 End stage renal disease: Secondary | ICD-10-CM | POA: Diagnosis not present

## 2015-05-16 DIAGNOSIS — E1129 Type 2 diabetes mellitus with other diabetic kidney complication: Secondary | ICD-10-CM | POA: Diagnosis not present

## 2015-05-16 DIAGNOSIS — N2581 Secondary hyperparathyroidism of renal origin: Secondary | ICD-10-CM | POA: Diagnosis not present

## 2015-05-16 DIAGNOSIS — D631 Anemia in chronic kidney disease: Secondary | ICD-10-CM | POA: Diagnosis not present

## 2015-05-18 DIAGNOSIS — E1129 Type 2 diabetes mellitus with other diabetic kidney complication: Secondary | ICD-10-CM | POA: Diagnosis not present

## 2015-05-18 DIAGNOSIS — N2581 Secondary hyperparathyroidism of renal origin: Secondary | ICD-10-CM | POA: Diagnosis not present

## 2015-05-18 DIAGNOSIS — N186 End stage renal disease: Secondary | ICD-10-CM | POA: Diagnosis not present

## 2015-05-18 DIAGNOSIS — D631 Anemia in chronic kidney disease: Secondary | ICD-10-CM | POA: Diagnosis not present

## 2015-05-21 DIAGNOSIS — D631 Anemia in chronic kidney disease: Secondary | ICD-10-CM | POA: Diagnosis not present

## 2015-05-21 DIAGNOSIS — N186 End stage renal disease: Secondary | ICD-10-CM | POA: Diagnosis not present

## 2015-05-21 DIAGNOSIS — N2581 Secondary hyperparathyroidism of renal origin: Secondary | ICD-10-CM | POA: Diagnosis not present

## 2015-05-21 DIAGNOSIS — Z992 Dependence on renal dialysis: Secondary | ICD-10-CM | POA: Diagnosis not present

## 2015-05-21 DIAGNOSIS — E1129 Type 2 diabetes mellitus with other diabetic kidney complication: Secondary | ICD-10-CM | POA: Diagnosis not present

## 2015-05-23 DIAGNOSIS — N2581 Secondary hyperparathyroidism of renal origin: Secondary | ICD-10-CM | POA: Diagnosis not present

## 2015-05-23 DIAGNOSIS — N186 End stage renal disease: Secondary | ICD-10-CM | POA: Diagnosis not present

## 2015-05-23 DIAGNOSIS — E1129 Type 2 diabetes mellitus with other diabetic kidney complication: Secondary | ICD-10-CM | POA: Diagnosis not present

## 2015-05-23 DIAGNOSIS — D631 Anemia in chronic kidney disease: Secondary | ICD-10-CM | POA: Diagnosis not present

## 2015-05-25 DIAGNOSIS — D631 Anemia in chronic kidney disease: Secondary | ICD-10-CM | POA: Diagnosis not present

## 2015-05-25 DIAGNOSIS — N2581 Secondary hyperparathyroidism of renal origin: Secondary | ICD-10-CM | POA: Diagnosis not present

## 2015-05-25 DIAGNOSIS — N186 End stage renal disease: Secondary | ICD-10-CM | POA: Diagnosis not present

## 2015-05-25 DIAGNOSIS — E1129 Type 2 diabetes mellitus with other diabetic kidney complication: Secondary | ICD-10-CM | POA: Diagnosis not present

## 2015-05-28 DIAGNOSIS — N186 End stage renal disease: Secondary | ICD-10-CM | POA: Diagnosis not present

## 2015-05-28 DIAGNOSIS — N2581 Secondary hyperparathyroidism of renal origin: Secondary | ICD-10-CM | POA: Diagnosis not present

## 2015-05-28 DIAGNOSIS — E1129 Type 2 diabetes mellitus with other diabetic kidney complication: Secondary | ICD-10-CM | POA: Diagnosis not present

## 2015-05-28 DIAGNOSIS — D631 Anemia in chronic kidney disease: Secondary | ICD-10-CM | POA: Diagnosis not present

## 2015-05-30 DIAGNOSIS — N186 End stage renal disease: Secondary | ICD-10-CM | POA: Diagnosis not present

## 2015-05-30 DIAGNOSIS — N2581 Secondary hyperparathyroidism of renal origin: Secondary | ICD-10-CM | POA: Diagnosis not present

## 2015-05-30 DIAGNOSIS — E1129 Type 2 diabetes mellitus with other diabetic kidney complication: Secondary | ICD-10-CM | POA: Diagnosis not present

## 2015-05-30 DIAGNOSIS — D631 Anemia in chronic kidney disease: Secondary | ICD-10-CM | POA: Diagnosis not present

## 2015-05-31 DIAGNOSIS — Z6832 Body mass index (BMI) 32.0-32.9, adult: Secondary | ICD-10-CM | POA: Diagnosis not present

## 2015-05-31 DIAGNOSIS — I10 Essential (primary) hypertension: Secondary | ICD-10-CM | POA: Diagnosis not present

## 2015-05-31 DIAGNOSIS — E1129 Type 2 diabetes mellitus with other diabetic kidney complication: Secondary | ICD-10-CM | POA: Diagnosis not present

## 2015-05-31 DIAGNOSIS — N184 Chronic kidney disease, stage 4 (severe): Secondary | ICD-10-CM | POA: Diagnosis not present

## 2015-06-01 DIAGNOSIS — E1129 Type 2 diabetes mellitus with other diabetic kidney complication: Secondary | ICD-10-CM | POA: Diagnosis not present

## 2015-06-01 DIAGNOSIS — N2581 Secondary hyperparathyroidism of renal origin: Secondary | ICD-10-CM | POA: Diagnosis not present

## 2015-06-01 DIAGNOSIS — D631 Anemia in chronic kidney disease: Secondary | ICD-10-CM | POA: Diagnosis not present

## 2015-06-01 DIAGNOSIS — N186 End stage renal disease: Secondary | ICD-10-CM | POA: Diagnosis not present

## 2015-06-04 DIAGNOSIS — E1129 Type 2 diabetes mellitus with other diabetic kidney complication: Secondary | ICD-10-CM | POA: Diagnosis not present

## 2015-06-04 DIAGNOSIS — N2581 Secondary hyperparathyroidism of renal origin: Secondary | ICD-10-CM | POA: Diagnosis not present

## 2015-06-04 DIAGNOSIS — N186 End stage renal disease: Secondary | ICD-10-CM | POA: Diagnosis not present

## 2015-06-04 DIAGNOSIS — D631 Anemia in chronic kidney disease: Secondary | ICD-10-CM | POA: Diagnosis not present

## 2015-06-06 DIAGNOSIS — N186 End stage renal disease: Secondary | ICD-10-CM | POA: Diagnosis not present

## 2015-06-06 DIAGNOSIS — E1129 Type 2 diabetes mellitus with other diabetic kidney complication: Secondary | ICD-10-CM | POA: Diagnosis not present

## 2015-06-06 DIAGNOSIS — D631 Anemia in chronic kidney disease: Secondary | ICD-10-CM | POA: Diagnosis not present

## 2015-06-06 DIAGNOSIS — N2581 Secondary hyperparathyroidism of renal origin: Secondary | ICD-10-CM | POA: Diagnosis not present

## 2015-06-08 DIAGNOSIS — N2581 Secondary hyperparathyroidism of renal origin: Secondary | ICD-10-CM | POA: Diagnosis not present

## 2015-06-08 DIAGNOSIS — D631 Anemia in chronic kidney disease: Secondary | ICD-10-CM | POA: Diagnosis not present

## 2015-06-08 DIAGNOSIS — E1129 Type 2 diabetes mellitus with other diabetic kidney complication: Secondary | ICD-10-CM | POA: Diagnosis not present

## 2015-06-08 DIAGNOSIS — N186 End stage renal disease: Secondary | ICD-10-CM | POA: Diagnosis not present

## 2015-06-11 DIAGNOSIS — N186 End stage renal disease: Secondary | ICD-10-CM | POA: Diagnosis not present

## 2015-06-11 DIAGNOSIS — E1129 Type 2 diabetes mellitus with other diabetic kidney complication: Secondary | ICD-10-CM | POA: Diagnosis not present

## 2015-06-11 DIAGNOSIS — N2581 Secondary hyperparathyroidism of renal origin: Secondary | ICD-10-CM | POA: Diagnosis not present

## 2015-06-11 DIAGNOSIS — D631 Anemia in chronic kidney disease: Secondary | ICD-10-CM | POA: Diagnosis not present

## 2015-06-13 DIAGNOSIS — N186 End stage renal disease: Secondary | ICD-10-CM | POA: Diagnosis not present

## 2015-06-13 DIAGNOSIS — N2581 Secondary hyperparathyroidism of renal origin: Secondary | ICD-10-CM | POA: Diagnosis not present

## 2015-06-13 DIAGNOSIS — E1129 Type 2 diabetes mellitus with other diabetic kidney complication: Secondary | ICD-10-CM | POA: Diagnosis not present

## 2015-06-13 DIAGNOSIS — D631 Anemia in chronic kidney disease: Secondary | ICD-10-CM | POA: Diagnosis not present

## 2015-06-16 DIAGNOSIS — N2581 Secondary hyperparathyroidism of renal origin: Secondary | ICD-10-CM | POA: Diagnosis not present

## 2015-06-16 DIAGNOSIS — D631 Anemia in chronic kidney disease: Secondary | ICD-10-CM | POA: Diagnosis not present

## 2015-06-16 DIAGNOSIS — E1129 Type 2 diabetes mellitus with other diabetic kidney complication: Secondary | ICD-10-CM | POA: Diagnosis not present

## 2015-06-16 DIAGNOSIS — N186 End stage renal disease: Secondary | ICD-10-CM | POA: Diagnosis not present

## 2015-06-18 DIAGNOSIS — N2581 Secondary hyperparathyroidism of renal origin: Secondary | ICD-10-CM | POA: Diagnosis not present

## 2015-06-18 DIAGNOSIS — E1129 Type 2 diabetes mellitus with other diabetic kidney complication: Secondary | ICD-10-CM | POA: Diagnosis not present

## 2015-06-18 DIAGNOSIS — D631 Anemia in chronic kidney disease: Secondary | ICD-10-CM | POA: Diagnosis not present

## 2015-06-18 DIAGNOSIS — N186 End stage renal disease: Secondary | ICD-10-CM | POA: Diagnosis not present

## 2015-06-20 DIAGNOSIS — E1129 Type 2 diabetes mellitus with other diabetic kidney complication: Secondary | ICD-10-CM | POA: Diagnosis not present

## 2015-06-20 DIAGNOSIS — D631 Anemia in chronic kidney disease: Secondary | ICD-10-CM | POA: Diagnosis not present

## 2015-06-20 DIAGNOSIS — Z992 Dependence on renal dialysis: Secondary | ICD-10-CM | POA: Diagnosis not present

## 2015-06-20 DIAGNOSIS — N186 End stage renal disease: Secondary | ICD-10-CM | POA: Diagnosis not present

## 2015-06-20 DIAGNOSIS — N2581 Secondary hyperparathyroidism of renal origin: Secondary | ICD-10-CM | POA: Diagnosis not present

## 2015-06-22 DIAGNOSIS — N186 End stage renal disease: Secondary | ICD-10-CM | POA: Diagnosis not present

## 2015-06-22 DIAGNOSIS — D631 Anemia in chronic kidney disease: Secondary | ICD-10-CM | POA: Diagnosis not present

## 2015-06-22 DIAGNOSIS — N2581 Secondary hyperparathyroidism of renal origin: Secondary | ICD-10-CM | POA: Diagnosis not present

## 2015-06-22 DIAGNOSIS — E1129 Type 2 diabetes mellitus with other diabetic kidney complication: Secondary | ICD-10-CM | POA: Diagnosis not present

## 2015-06-25 DIAGNOSIS — E1129 Type 2 diabetes mellitus with other diabetic kidney complication: Secondary | ICD-10-CM | POA: Diagnosis not present

## 2015-06-25 DIAGNOSIS — N2581 Secondary hyperparathyroidism of renal origin: Secondary | ICD-10-CM | POA: Diagnosis not present

## 2015-06-25 DIAGNOSIS — N186 End stage renal disease: Secondary | ICD-10-CM | POA: Diagnosis not present

## 2015-06-25 DIAGNOSIS — D631 Anemia in chronic kidney disease: Secondary | ICD-10-CM | POA: Diagnosis not present

## 2015-06-27 DIAGNOSIS — N2581 Secondary hyperparathyroidism of renal origin: Secondary | ICD-10-CM | POA: Diagnosis not present

## 2015-06-27 DIAGNOSIS — E1129 Type 2 diabetes mellitus with other diabetic kidney complication: Secondary | ICD-10-CM | POA: Diagnosis not present

## 2015-06-27 DIAGNOSIS — N186 End stage renal disease: Secondary | ICD-10-CM | POA: Diagnosis not present

## 2015-06-27 DIAGNOSIS — D631 Anemia in chronic kidney disease: Secondary | ICD-10-CM | POA: Diagnosis not present

## 2015-06-29 DIAGNOSIS — N2581 Secondary hyperparathyroidism of renal origin: Secondary | ICD-10-CM | POA: Diagnosis not present

## 2015-06-29 DIAGNOSIS — N186 End stage renal disease: Secondary | ICD-10-CM | POA: Diagnosis not present

## 2015-06-29 DIAGNOSIS — D631 Anemia in chronic kidney disease: Secondary | ICD-10-CM | POA: Diagnosis not present

## 2015-06-29 DIAGNOSIS — E1129 Type 2 diabetes mellitus with other diabetic kidney complication: Secondary | ICD-10-CM | POA: Diagnosis not present

## 2015-07-02 DIAGNOSIS — D631 Anemia in chronic kidney disease: Secondary | ICD-10-CM | POA: Diagnosis not present

## 2015-07-02 DIAGNOSIS — E1129 Type 2 diabetes mellitus with other diabetic kidney complication: Secondary | ICD-10-CM | POA: Diagnosis not present

## 2015-07-02 DIAGNOSIS — N2581 Secondary hyperparathyroidism of renal origin: Secondary | ICD-10-CM | POA: Diagnosis not present

## 2015-07-02 DIAGNOSIS — N186 End stage renal disease: Secondary | ICD-10-CM | POA: Diagnosis not present

## 2015-07-04 DIAGNOSIS — N2581 Secondary hyperparathyroidism of renal origin: Secondary | ICD-10-CM | POA: Diagnosis not present

## 2015-07-04 DIAGNOSIS — D631 Anemia in chronic kidney disease: Secondary | ICD-10-CM | POA: Diagnosis not present

## 2015-07-04 DIAGNOSIS — E1129 Type 2 diabetes mellitus with other diabetic kidney complication: Secondary | ICD-10-CM | POA: Diagnosis not present

## 2015-07-04 DIAGNOSIS — N186 End stage renal disease: Secondary | ICD-10-CM | POA: Diagnosis not present

## 2015-07-06 DIAGNOSIS — D631 Anemia in chronic kidney disease: Secondary | ICD-10-CM | POA: Diagnosis not present

## 2015-07-06 DIAGNOSIS — N186 End stage renal disease: Secondary | ICD-10-CM | POA: Diagnosis not present

## 2015-07-06 DIAGNOSIS — E1129 Type 2 diabetes mellitus with other diabetic kidney complication: Secondary | ICD-10-CM | POA: Diagnosis not present

## 2015-07-06 DIAGNOSIS — N2581 Secondary hyperparathyroidism of renal origin: Secondary | ICD-10-CM | POA: Diagnosis not present

## 2015-07-09 DIAGNOSIS — D631 Anemia in chronic kidney disease: Secondary | ICD-10-CM | POA: Diagnosis not present

## 2015-07-09 DIAGNOSIS — N186 End stage renal disease: Secondary | ICD-10-CM | POA: Diagnosis not present

## 2015-07-09 DIAGNOSIS — N2581 Secondary hyperparathyroidism of renal origin: Secondary | ICD-10-CM | POA: Diagnosis not present

## 2015-07-09 DIAGNOSIS — E1129 Type 2 diabetes mellitus with other diabetic kidney complication: Secondary | ICD-10-CM | POA: Diagnosis not present

## 2015-07-10 DIAGNOSIS — H353211 Exudative age-related macular degeneration, right eye, with active choroidal neovascularization: Secondary | ICD-10-CM | POA: Diagnosis not present

## 2015-07-11 ENCOUNTER — Other Ambulatory Visit: Payer: Self-pay | Admitting: Internal Medicine

## 2015-07-11 DIAGNOSIS — N186 End stage renal disease: Secondary | ICD-10-CM | POA: Diagnosis not present

## 2015-07-11 DIAGNOSIS — D631 Anemia in chronic kidney disease: Secondary | ICD-10-CM | POA: Diagnosis not present

## 2015-07-11 DIAGNOSIS — N2581 Secondary hyperparathyroidism of renal origin: Secondary | ICD-10-CM | POA: Diagnosis not present

## 2015-07-11 DIAGNOSIS — E1129 Type 2 diabetes mellitus with other diabetic kidney complication: Secondary | ICD-10-CM | POA: Diagnosis not present

## 2015-07-13 DIAGNOSIS — D631 Anemia in chronic kidney disease: Secondary | ICD-10-CM | POA: Diagnosis not present

## 2015-07-13 DIAGNOSIS — E1129 Type 2 diabetes mellitus with other diabetic kidney complication: Secondary | ICD-10-CM | POA: Diagnosis not present

## 2015-07-13 DIAGNOSIS — N186 End stage renal disease: Secondary | ICD-10-CM | POA: Diagnosis not present

## 2015-07-13 DIAGNOSIS — N2581 Secondary hyperparathyroidism of renal origin: Secondary | ICD-10-CM | POA: Diagnosis not present

## 2015-07-16 DIAGNOSIS — N2581 Secondary hyperparathyroidism of renal origin: Secondary | ICD-10-CM | POA: Diagnosis not present

## 2015-07-16 DIAGNOSIS — N186 End stage renal disease: Secondary | ICD-10-CM | POA: Diagnosis not present

## 2015-07-16 DIAGNOSIS — E1129 Type 2 diabetes mellitus with other diabetic kidney complication: Secondary | ICD-10-CM | POA: Diagnosis not present

## 2015-07-16 DIAGNOSIS — D631 Anemia in chronic kidney disease: Secondary | ICD-10-CM | POA: Diagnosis not present

## 2015-07-18 DIAGNOSIS — D631 Anemia in chronic kidney disease: Secondary | ICD-10-CM | POA: Diagnosis not present

## 2015-07-18 DIAGNOSIS — N2581 Secondary hyperparathyroidism of renal origin: Secondary | ICD-10-CM | POA: Diagnosis not present

## 2015-07-18 DIAGNOSIS — N186 End stage renal disease: Secondary | ICD-10-CM | POA: Diagnosis not present

## 2015-07-18 DIAGNOSIS — E1129 Type 2 diabetes mellitus with other diabetic kidney complication: Secondary | ICD-10-CM | POA: Diagnosis not present

## 2015-07-20 DIAGNOSIS — N2581 Secondary hyperparathyroidism of renal origin: Secondary | ICD-10-CM | POA: Diagnosis not present

## 2015-07-20 DIAGNOSIS — D631 Anemia in chronic kidney disease: Secondary | ICD-10-CM | POA: Diagnosis not present

## 2015-07-20 DIAGNOSIS — E1129 Type 2 diabetes mellitus with other diabetic kidney complication: Secondary | ICD-10-CM | POA: Diagnosis not present

## 2015-07-20 DIAGNOSIS — N186 End stage renal disease: Secondary | ICD-10-CM | POA: Diagnosis not present

## 2015-07-21 DIAGNOSIS — Z992 Dependence on renal dialysis: Secondary | ICD-10-CM | POA: Diagnosis not present

## 2015-07-21 DIAGNOSIS — N186 End stage renal disease: Secondary | ICD-10-CM | POA: Diagnosis not present

## 2015-07-21 DIAGNOSIS — E1129 Type 2 diabetes mellitus with other diabetic kidney complication: Secondary | ICD-10-CM | POA: Diagnosis not present

## 2015-07-23 DIAGNOSIS — E1129 Type 2 diabetes mellitus with other diabetic kidney complication: Secondary | ICD-10-CM | POA: Diagnosis not present

## 2015-07-23 DIAGNOSIS — N2581 Secondary hyperparathyroidism of renal origin: Secondary | ICD-10-CM | POA: Diagnosis not present

## 2015-07-23 DIAGNOSIS — D631 Anemia in chronic kidney disease: Secondary | ICD-10-CM | POA: Diagnosis not present

## 2015-07-23 DIAGNOSIS — N186 End stage renal disease: Secondary | ICD-10-CM | POA: Diagnosis not present

## 2015-07-25 DIAGNOSIS — D631 Anemia in chronic kidney disease: Secondary | ICD-10-CM | POA: Diagnosis not present

## 2015-07-25 DIAGNOSIS — N2581 Secondary hyperparathyroidism of renal origin: Secondary | ICD-10-CM | POA: Diagnosis not present

## 2015-07-25 DIAGNOSIS — E1129 Type 2 diabetes mellitus with other diabetic kidney complication: Secondary | ICD-10-CM | POA: Diagnosis not present

## 2015-07-25 DIAGNOSIS — N186 End stage renal disease: Secondary | ICD-10-CM | POA: Diagnosis not present

## 2015-07-27 ENCOUNTER — Other Ambulatory Visit: Payer: Self-pay

## 2015-07-27 ENCOUNTER — Telehealth: Payer: Self-pay

## 2015-07-27 DIAGNOSIS — E1129 Type 2 diabetes mellitus with other diabetic kidney complication: Secondary | ICD-10-CM | POA: Diagnosis not present

## 2015-07-27 DIAGNOSIS — K769 Liver disease, unspecified: Secondary | ICD-10-CM

## 2015-07-27 DIAGNOSIS — D631 Anemia in chronic kidney disease: Secondary | ICD-10-CM | POA: Diagnosis not present

## 2015-07-27 DIAGNOSIS — N186 End stage renal disease: Secondary | ICD-10-CM | POA: Diagnosis not present

## 2015-07-27 DIAGNOSIS — N2581 Secondary hyperparathyroidism of renal origin: Secondary | ICD-10-CM | POA: Diagnosis not present

## 2015-07-27 NOTE — Telephone Encounter (Signed)
-----   Message from Marlon Pel, RN sent at 04/26/2015 11:38 AM EDT ----- We doubt this is cancer or anything bad but that is still a possibility.  We will need to arrange for another CT in 3 months from now - shoe will need the deep IV by IR again and triple phase (hepatic protocol) CT abd to follow-up liver lesion

## 2015-07-27 NOTE — Telephone Encounter (Signed)
CT scan is scheduled for 08/09/15 2:00 at [facility] I have a message into IR to schedule IV placement Left message for patient to call back Patient will also need BUN and creat

## 2015-07-27 NOTE — Telephone Encounter (Signed)
IV placement is scheduled for 08/09/15 2:00 Left message for patient to call back

## 2015-07-30 DIAGNOSIS — E1129 Type 2 diabetes mellitus with other diabetic kidney complication: Secondary | ICD-10-CM | POA: Diagnosis not present

## 2015-07-30 DIAGNOSIS — N2581 Secondary hyperparathyroidism of renal origin: Secondary | ICD-10-CM | POA: Diagnosis not present

## 2015-07-30 DIAGNOSIS — D631 Anemia in chronic kidney disease: Secondary | ICD-10-CM | POA: Diagnosis not present

## 2015-07-30 DIAGNOSIS — N186 End stage renal disease: Secondary | ICD-10-CM | POA: Diagnosis not present

## 2015-07-30 NOTE — Telephone Encounter (Signed)
Patient notified Instructions and contrast left at the front desk.   She verbalized understanding

## 2015-08-01 DIAGNOSIS — N186 End stage renal disease: Secondary | ICD-10-CM | POA: Diagnosis not present

## 2015-08-01 DIAGNOSIS — E1129 Type 2 diabetes mellitus with other diabetic kidney complication: Secondary | ICD-10-CM | POA: Diagnosis not present

## 2015-08-01 DIAGNOSIS — N2581 Secondary hyperparathyroidism of renal origin: Secondary | ICD-10-CM | POA: Diagnosis not present

## 2015-08-01 DIAGNOSIS — D631 Anemia in chronic kidney disease: Secondary | ICD-10-CM | POA: Diagnosis not present

## 2015-08-03 ENCOUNTER — Encounter: Payer: Self-pay | Admitting: Vascular Surgery

## 2015-08-03 DIAGNOSIS — N2581 Secondary hyperparathyroidism of renal origin: Secondary | ICD-10-CM | POA: Diagnosis not present

## 2015-08-03 DIAGNOSIS — N186 End stage renal disease: Secondary | ICD-10-CM | POA: Diagnosis not present

## 2015-08-03 DIAGNOSIS — D631 Anemia in chronic kidney disease: Secondary | ICD-10-CM | POA: Diagnosis not present

## 2015-08-03 DIAGNOSIS — E1129 Type 2 diabetes mellitus with other diabetic kidney complication: Secondary | ICD-10-CM | POA: Diagnosis not present

## 2015-08-06 DIAGNOSIS — E1129 Type 2 diabetes mellitus with other diabetic kidney complication: Secondary | ICD-10-CM | POA: Diagnosis not present

## 2015-08-06 DIAGNOSIS — D631 Anemia in chronic kidney disease: Secondary | ICD-10-CM | POA: Diagnosis not present

## 2015-08-06 DIAGNOSIS — N186 End stage renal disease: Secondary | ICD-10-CM | POA: Diagnosis not present

## 2015-08-06 DIAGNOSIS — N2581 Secondary hyperparathyroidism of renal origin: Secondary | ICD-10-CM | POA: Diagnosis not present

## 2015-08-07 ENCOUNTER — Encounter: Payer: Medicare Other | Admitting: Vascular Surgery

## 2015-08-08 DIAGNOSIS — N2581 Secondary hyperparathyroidism of renal origin: Secondary | ICD-10-CM | POA: Diagnosis not present

## 2015-08-08 DIAGNOSIS — N186 End stage renal disease: Secondary | ICD-10-CM | POA: Diagnosis not present

## 2015-08-08 DIAGNOSIS — D631 Anemia in chronic kidney disease: Secondary | ICD-10-CM | POA: Diagnosis not present

## 2015-08-08 DIAGNOSIS — E1129 Type 2 diabetes mellitus with other diabetic kidney complication: Secondary | ICD-10-CM | POA: Diagnosis not present

## 2015-08-09 ENCOUNTER — Inpatient Hospital Stay (HOSPITAL_COMMUNITY): Admission: RE | Admit: 2015-08-09 | Payer: Medicare Other | Source: Ambulatory Visit

## 2015-08-09 ENCOUNTER — Ambulatory Visit (HOSPITAL_COMMUNITY): Payer: Medicare Other

## 2015-08-10 DIAGNOSIS — N186 End stage renal disease: Secondary | ICD-10-CM | POA: Diagnosis not present

## 2015-08-10 DIAGNOSIS — N2581 Secondary hyperparathyroidism of renal origin: Secondary | ICD-10-CM | POA: Diagnosis not present

## 2015-08-10 DIAGNOSIS — E1129 Type 2 diabetes mellitus with other diabetic kidney complication: Secondary | ICD-10-CM | POA: Diagnosis not present

## 2015-08-10 DIAGNOSIS — D631 Anemia in chronic kidney disease: Secondary | ICD-10-CM | POA: Diagnosis not present

## 2015-08-13 DIAGNOSIS — N186 End stage renal disease: Secondary | ICD-10-CM | POA: Diagnosis not present

## 2015-08-13 DIAGNOSIS — D631 Anemia in chronic kidney disease: Secondary | ICD-10-CM | POA: Diagnosis not present

## 2015-08-13 DIAGNOSIS — N2581 Secondary hyperparathyroidism of renal origin: Secondary | ICD-10-CM | POA: Diagnosis not present

## 2015-08-13 DIAGNOSIS — E1129 Type 2 diabetes mellitus with other diabetic kidney complication: Secondary | ICD-10-CM | POA: Diagnosis not present

## 2015-08-15 DIAGNOSIS — D631 Anemia in chronic kidney disease: Secondary | ICD-10-CM | POA: Diagnosis not present

## 2015-08-15 DIAGNOSIS — E1129 Type 2 diabetes mellitus with other diabetic kidney complication: Secondary | ICD-10-CM | POA: Diagnosis not present

## 2015-08-15 DIAGNOSIS — N186 End stage renal disease: Secondary | ICD-10-CM | POA: Diagnosis not present

## 2015-08-15 DIAGNOSIS — N2581 Secondary hyperparathyroidism of renal origin: Secondary | ICD-10-CM | POA: Diagnosis not present

## 2015-08-17 DIAGNOSIS — E1129 Type 2 diabetes mellitus with other diabetic kidney complication: Secondary | ICD-10-CM | POA: Diagnosis not present

## 2015-08-17 DIAGNOSIS — N186 End stage renal disease: Secondary | ICD-10-CM | POA: Diagnosis not present

## 2015-08-17 DIAGNOSIS — D631 Anemia in chronic kidney disease: Secondary | ICD-10-CM | POA: Diagnosis not present

## 2015-08-17 DIAGNOSIS — N2581 Secondary hyperparathyroidism of renal origin: Secondary | ICD-10-CM | POA: Diagnosis not present

## 2015-08-20 DIAGNOSIS — D631 Anemia in chronic kidney disease: Secondary | ICD-10-CM | POA: Diagnosis not present

## 2015-08-20 DIAGNOSIS — N2581 Secondary hyperparathyroidism of renal origin: Secondary | ICD-10-CM | POA: Diagnosis not present

## 2015-08-20 DIAGNOSIS — E1129 Type 2 diabetes mellitus with other diabetic kidney complication: Secondary | ICD-10-CM | POA: Diagnosis not present

## 2015-08-20 DIAGNOSIS — N186 End stage renal disease: Secondary | ICD-10-CM | POA: Diagnosis not present

## 2015-08-21 DIAGNOSIS — Z992 Dependence on renal dialysis: Secondary | ICD-10-CM | POA: Diagnosis not present

## 2015-08-21 DIAGNOSIS — N186 End stage renal disease: Secondary | ICD-10-CM | POA: Diagnosis not present

## 2015-08-21 DIAGNOSIS — E1129 Type 2 diabetes mellitus with other diabetic kidney complication: Secondary | ICD-10-CM | POA: Diagnosis not present

## 2015-08-22 DIAGNOSIS — N186 End stage renal disease: Secondary | ICD-10-CM | POA: Diagnosis not present

## 2015-08-22 DIAGNOSIS — D631 Anemia in chronic kidney disease: Secondary | ICD-10-CM | POA: Diagnosis not present

## 2015-08-22 DIAGNOSIS — N2581 Secondary hyperparathyroidism of renal origin: Secondary | ICD-10-CM | POA: Diagnosis not present

## 2015-08-22 DIAGNOSIS — E1129 Type 2 diabetes mellitus with other diabetic kidney complication: Secondary | ICD-10-CM | POA: Diagnosis not present

## 2015-08-24 DIAGNOSIS — D631 Anemia in chronic kidney disease: Secondary | ICD-10-CM | POA: Diagnosis not present

## 2015-08-24 DIAGNOSIS — N186 End stage renal disease: Secondary | ICD-10-CM | POA: Diagnosis not present

## 2015-08-24 DIAGNOSIS — E1129 Type 2 diabetes mellitus with other diabetic kidney complication: Secondary | ICD-10-CM | POA: Diagnosis not present

## 2015-08-24 DIAGNOSIS — N2581 Secondary hyperparathyroidism of renal origin: Secondary | ICD-10-CM | POA: Diagnosis not present

## 2015-08-27 DIAGNOSIS — N186 End stage renal disease: Secondary | ICD-10-CM | POA: Diagnosis not present

## 2015-08-27 DIAGNOSIS — E1129 Type 2 diabetes mellitus with other diabetic kidney complication: Secondary | ICD-10-CM | POA: Diagnosis not present

## 2015-08-27 DIAGNOSIS — N2581 Secondary hyperparathyroidism of renal origin: Secondary | ICD-10-CM | POA: Diagnosis not present

## 2015-08-27 DIAGNOSIS — D631 Anemia in chronic kidney disease: Secondary | ICD-10-CM | POA: Diagnosis not present

## 2015-08-29 DIAGNOSIS — N2581 Secondary hyperparathyroidism of renal origin: Secondary | ICD-10-CM | POA: Diagnosis not present

## 2015-08-29 DIAGNOSIS — E1129 Type 2 diabetes mellitus with other diabetic kidney complication: Secondary | ICD-10-CM | POA: Diagnosis not present

## 2015-08-29 DIAGNOSIS — D631 Anemia in chronic kidney disease: Secondary | ICD-10-CM | POA: Diagnosis not present

## 2015-08-29 DIAGNOSIS — N186 End stage renal disease: Secondary | ICD-10-CM | POA: Diagnosis not present

## 2015-08-31 DIAGNOSIS — N2581 Secondary hyperparathyroidism of renal origin: Secondary | ICD-10-CM | POA: Diagnosis not present

## 2015-08-31 DIAGNOSIS — N186 End stage renal disease: Secondary | ICD-10-CM | POA: Diagnosis not present

## 2015-08-31 DIAGNOSIS — E1129 Type 2 diabetes mellitus with other diabetic kidney complication: Secondary | ICD-10-CM | POA: Diagnosis not present

## 2015-08-31 DIAGNOSIS — D631 Anemia in chronic kidney disease: Secondary | ICD-10-CM | POA: Diagnosis not present

## 2015-09-03 DIAGNOSIS — D631 Anemia in chronic kidney disease: Secondary | ICD-10-CM | POA: Diagnosis not present

## 2015-09-03 DIAGNOSIS — N186 End stage renal disease: Secondary | ICD-10-CM | POA: Diagnosis not present

## 2015-09-03 DIAGNOSIS — E1129 Type 2 diabetes mellitus with other diabetic kidney complication: Secondary | ICD-10-CM | POA: Diagnosis not present

## 2015-09-03 DIAGNOSIS — N2581 Secondary hyperparathyroidism of renal origin: Secondary | ICD-10-CM | POA: Diagnosis not present

## 2015-09-05 DIAGNOSIS — N2581 Secondary hyperparathyroidism of renal origin: Secondary | ICD-10-CM | POA: Diagnosis not present

## 2015-09-05 DIAGNOSIS — E1129 Type 2 diabetes mellitus with other diabetic kidney complication: Secondary | ICD-10-CM | POA: Diagnosis not present

## 2015-09-05 DIAGNOSIS — D631 Anemia in chronic kidney disease: Secondary | ICD-10-CM | POA: Diagnosis not present

## 2015-09-05 DIAGNOSIS — N186 End stage renal disease: Secondary | ICD-10-CM | POA: Diagnosis not present

## 2015-09-07 DIAGNOSIS — D631 Anemia in chronic kidney disease: Secondary | ICD-10-CM | POA: Diagnosis not present

## 2015-09-07 DIAGNOSIS — E1129 Type 2 diabetes mellitus with other diabetic kidney complication: Secondary | ICD-10-CM | POA: Diagnosis not present

## 2015-09-07 DIAGNOSIS — N2581 Secondary hyperparathyroidism of renal origin: Secondary | ICD-10-CM | POA: Diagnosis not present

## 2015-09-07 DIAGNOSIS — N186 End stage renal disease: Secondary | ICD-10-CM | POA: Diagnosis not present

## 2015-09-10 DIAGNOSIS — N186 End stage renal disease: Secondary | ICD-10-CM | POA: Diagnosis not present

## 2015-09-10 DIAGNOSIS — E1129 Type 2 diabetes mellitus with other diabetic kidney complication: Secondary | ICD-10-CM | POA: Diagnosis not present

## 2015-09-10 DIAGNOSIS — D631 Anemia in chronic kidney disease: Secondary | ICD-10-CM | POA: Diagnosis not present

## 2015-09-10 DIAGNOSIS — N2581 Secondary hyperparathyroidism of renal origin: Secondary | ICD-10-CM | POA: Diagnosis not present

## 2015-09-12 DIAGNOSIS — N2581 Secondary hyperparathyroidism of renal origin: Secondary | ICD-10-CM | POA: Diagnosis not present

## 2015-09-12 DIAGNOSIS — D631 Anemia in chronic kidney disease: Secondary | ICD-10-CM | POA: Diagnosis not present

## 2015-09-12 DIAGNOSIS — E1129 Type 2 diabetes mellitus with other diabetic kidney complication: Secondary | ICD-10-CM | POA: Diagnosis not present

## 2015-09-12 DIAGNOSIS — N186 End stage renal disease: Secondary | ICD-10-CM | POA: Diagnosis not present

## 2015-09-14 DIAGNOSIS — D631 Anemia in chronic kidney disease: Secondary | ICD-10-CM | POA: Diagnosis not present

## 2015-09-14 DIAGNOSIS — N2581 Secondary hyperparathyroidism of renal origin: Secondary | ICD-10-CM | POA: Diagnosis not present

## 2015-09-14 DIAGNOSIS — N186 End stage renal disease: Secondary | ICD-10-CM | POA: Diagnosis not present

## 2015-09-14 DIAGNOSIS — E1129 Type 2 diabetes mellitus with other diabetic kidney complication: Secondary | ICD-10-CM | POA: Diagnosis not present

## 2015-09-17 DIAGNOSIS — D631 Anemia in chronic kidney disease: Secondary | ICD-10-CM | POA: Diagnosis not present

## 2015-09-17 DIAGNOSIS — E1129 Type 2 diabetes mellitus with other diabetic kidney complication: Secondary | ICD-10-CM | POA: Diagnosis not present

## 2015-09-17 DIAGNOSIS — N2581 Secondary hyperparathyroidism of renal origin: Secondary | ICD-10-CM | POA: Diagnosis not present

## 2015-09-17 DIAGNOSIS — N186 End stage renal disease: Secondary | ICD-10-CM | POA: Diagnosis not present

## 2015-09-18 ENCOUNTER — Ambulatory Visit: Payer: Medicare Other | Admitting: Podiatry

## 2015-09-18 DIAGNOSIS — N186 End stage renal disease: Secondary | ICD-10-CM | POA: Diagnosis not present

## 2015-09-18 DIAGNOSIS — E1129 Type 2 diabetes mellitus with other diabetic kidney complication: Secondary | ICD-10-CM | POA: Diagnosis not present

## 2015-09-18 DIAGNOSIS — Z992 Dependence on renal dialysis: Secondary | ICD-10-CM | POA: Diagnosis not present

## 2015-09-19 DIAGNOSIS — N186 End stage renal disease: Secondary | ICD-10-CM | POA: Diagnosis not present

## 2015-09-19 DIAGNOSIS — N2581 Secondary hyperparathyroidism of renal origin: Secondary | ICD-10-CM | POA: Diagnosis not present

## 2015-09-19 DIAGNOSIS — D509 Iron deficiency anemia, unspecified: Secondary | ICD-10-CM | POA: Diagnosis not present

## 2015-09-19 DIAGNOSIS — D631 Anemia in chronic kidney disease: Secondary | ICD-10-CM | POA: Diagnosis not present

## 2015-09-19 DIAGNOSIS — E1129 Type 2 diabetes mellitus with other diabetic kidney complication: Secondary | ICD-10-CM | POA: Diagnosis not present

## 2015-09-21 DIAGNOSIS — D631 Anemia in chronic kidney disease: Secondary | ICD-10-CM | POA: Diagnosis not present

## 2015-09-21 DIAGNOSIS — D509 Iron deficiency anemia, unspecified: Secondary | ICD-10-CM | POA: Diagnosis not present

## 2015-09-21 DIAGNOSIS — E1129 Type 2 diabetes mellitus with other diabetic kidney complication: Secondary | ICD-10-CM | POA: Diagnosis not present

## 2015-09-21 DIAGNOSIS — N2581 Secondary hyperparathyroidism of renal origin: Secondary | ICD-10-CM | POA: Diagnosis not present

## 2015-09-21 DIAGNOSIS — N186 End stage renal disease: Secondary | ICD-10-CM | POA: Diagnosis not present

## 2015-09-24 DIAGNOSIS — N186 End stage renal disease: Secondary | ICD-10-CM | POA: Diagnosis not present

## 2015-09-24 DIAGNOSIS — D509 Iron deficiency anemia, unspecified: Secondary | ICD-10-CM | POA: Diagnosis not present

## 2015-09-24 DIAGNOSIS — D631 Anemia in chronic kidney disease: Secondary | ICD-10-CM | POA: Diagnosis not present

## 2015-09-24 DIAGNOSIS — N2581 Secondary hyperparathyroidism of renal origin: Secondary | ICD-10-CM | POA: Diagnosis not present

## 2015-09-24 DIAGNOSIS — E1129 Type 2 diabetes mellitus with other diabetic kidney complication: Secondary | ICD-10-CM | POA: Diagnosis not present

## 2015-09-26 DIAGNOSIS — N2581 Secondary hyperparathyroidism of renal origin: Secondary | ICD-10-CM | POA: Diagnosis not present

## 2015-09-26 DIAGNOSIS — N186 End stage renal disease: Secondary | ICD-10-CM | POA: Diagnosis not present

## 2015-09-26 DIAGNOSIS — E1129 Type 2 diabetes mellitus with other diabetic kidney complication: Secondary | ICD-10-CM | POA: Diagnosis not present

## 2015-09-26 DIAGNOSIS — D631 Anemia in chronic kidney disease: Secondary | ICD-10-CM | POA: Diagnosis not present

## 2015-09-26 DIAGNOSIS — D509 Iron deficiency anemia, unspecified: Secondary | ICD-10-CM | POA: Diagnosis not present

## 2015-09-28 DIAGNOSIS — D631 Anemia in chronic kidney disease: Secondary | ICD-10-CM | POA: Diagnosis not present

## 2015-09-28 DIAGNOSIS — D509 Iron deficiency anemia, unspecified: Secondary | ICD-10-CM | POA: Diagnosis not present

## 2015-09-28 DIAGNOSIS — N2581 Secondary hyperparathyroidism of renal origin: Secondary | ICD-10-CM | POA: Diagnosis not present

## 2015-09-28 DIAGNOSIS — N186 End stage renal disease: Secondary | ICD-10-CM | POA: Diagnosis not present

## 2015-09-28 DIAGNOSIS — E1129 Type 2 diabetes mellitus with other diabetic kidney complication: Secondary | ICD-10-CM | POA: Diagnosis not present

## 2015-10-01 DIAGNOSIS — N2581 Secondary hyperparathyroidism of renal origin: Secondary | ICD-10-CM | POA: Diagnosis not present

## 2015-10-01 DIAGNOSIS — E1129 Type 2 diabetes mellitus with other diabetic kidney complication: Secondary | ICD-10-CM | POA: Diagnosis not present

## 2015-10-01 DIAGNOSIS — N186 End stage renal disease: Secondary | ICD-10-CM | POA: Diagnosis not present

## 2015-10-01 DIAGNOSIS — D631 Anemia in chronic kidney disease: Secondary | ICD-10-CM | POA: Diagnosis not present

## 2015-10-01 DIAGNOSIS — D509 Iron deficiency anemia, unspecified: Secondary | ICD-10-CM | POA: Diagnosis not present

## 2015-10-03 DIAGNOSIS — D509 Iron deficiency anemia, unspecified: Secondary | ICD-10-CM | POA: Diagnosis not present

## 2015-10-03 DIAGNOSIS — N186 End stage renal disease: Secondary | ICD-10-CM | POA: Diagnosis not present

## 2015-10-03 DIAGNOSIS — N2581 Secondary hyperparathyroidism of renal origin: Secondary | ICD-10-CM | POA: Diagnosis not present

## 2015-10-03 DIAGNOSIS — E1129 Type 2 diabetes mellitus with other diabetic kidney complication: Secondary | ICD-10-CM | POA: Diagnosis not present

## 2015-10-03 DIAGNOSIS — D631 Anemia in chronic kidney disease: Secondary | ICD-10-CM | POA: Diagnosis not present

## 2015-10-04 DIAGNOSIS — M25512 Pain in left shoulder: Secondary | ICD-10-CM | POA: Diagnosis not present

## 2015-10-04 DIAGNOSIS — M25511 Pain in right shoulder: Secondary | ICD-10-CM | POA: Diagnosis not present

## 2015-10-04 DIAGNOSIS — M4722 Other spondylosis with radiculopathy, cervical region: Secondary | ICD-10-CM | POA: Diagnosis not present

## 2015-10-05 DIAGNOSIS — N2581 Secondary hyperparathyroidism of renal origin: Secondary | ICD-10-CM | POA: Diagnosis not present

## 2015-10-05 DIAGNOSIS — D631 Anemia in chronic kidney disease: Secondary | ICD-10-CM | POA: Diagnosis not present

## 2015-10-05 DIAGNOSIS — N186 End stage renal disease: Secondary | ICD-10-CM | POA: Diagnosis not present

## 2015-10-05 DIAGNOSIS — D509 Iron deficiency anemia, unspecified: Secondary | ICD-10-CM | POA: Diagnosis not present

## 2015-10-05 DIAGNOSIS — E1129 Type 2 diabetes mellitus with other diabetic kidney complication: Secondary | ICD-10-CM | POA: Diagnosis not present

## 2015-10-08 DIAGNOSIS — N186 End stage renal disease: Secondary | ICD-10-CM | POA: Diagnosis not present

## 2015-10-08 DIAGNOSIS — E1129 Type 2 diabetes mellitus with other diabetic kidney complication: Secondary | ICD-10-CM | POA: Diagnosis not present

## 2015-10-08 DIAGNOSIS — D509 Iron deficiency anemia, unspecified: Secondary | ICD-10-CM | POA: Diagnosis not present

## 2015-10-08 DIAGNOSIS — D631 Anemia in chronic kidney disease: Secondary | ICD-10-CM | POA: Diagnosis not present

## 2015-10-08 DIAGNOSIS — N2581 Secondary hyperparathyroidism of renal origin: Secondary | ICD-10-CM | POA: Diagnosis not present

## 2015-10-09 DIAGNOSIS — H353212 Exudative age-related macular degeneration, right eye, with inactive choroidal neovascularization: Secondary | ICD-10-CM | POA: Diagnosis not present

## 2015-10-09 DIAGNOSIS — H353211 Exudative age-related macular degeneration, right eye, with active choroidal neovascularization: Secondary | ICD-10-CM | POA: Diagnosis not present

## 2015-10-09 DIAGNOSIS — H3561 Retinal hemorrhage, right eye: Secondary | ICD-10-CM | POA: Diagnosis not present

## 2015-10-09 DIAGNOSIS — E113591 Type 2 diabetes mellitus with proliferative diabetic retinopathy without macular edema, right eye: Secondary | ICD-10-CM | POA: Diagnosis not present

## 2015-10-10 DIAGNOSIS — D631 Anemia in chronic kidney disease: Secondary | ICD-10-CM | POA: Diagnosis not present

## 2015-10-10 DIAGNOSIS — D509 Iron deficiency anemia, unspecified: Secondary | ICD-10-CM | POA: Diagnosis not present

## 2015-10-10 DIAGNOSIS — N186 End stage renal disease: Secondary | ICD-10-CM | POA: Diagnosis not present

## 2015-10-10 DIAGNOSIS — E1129 Type 2 diabetes mellitus with other diabetic kidney complication: Secondary | ICD-10-CM | POA: Diagnosis not present

## 2015-10-10 DIAGNOSIS — N2581 Secondary hyperparathyroidism of renal origin: Secondary | ICD-10-CM | POA: Diagnosis not present

## 2015-10-12 DIAGNOSIS — D631 Anemia in chronic kidney disease: Secondary | ICD-10-CM | POA: Diagnosis not present

## 2015-10-12 DIAGNOSIS — E1129 Type 2 diabetes mellitus with other diabetic kidney complication: Secondary | ICD-10-CM | POA: Diagnosis not present

## 2015-10-12 DIAGNOSIS — D509 Iron deficiency anemia, unspecified: Secondary | ICD-10-CM | POA: Diagnosis not present

## 2015-10-12 DIAGNOSIS — N186 End stage renal disease: Secondary | ICD-10-CM | POA: Diagnosis not present

## 2015-10-12 DIAGNOSIS — N2581 Secondary hyperparathyroidism of renal origin: Secondary | ICD-10-CM | POA: Diagnosis not present

## 2015-10-15 DIAGNOSIS — E1129 Type 2 diabetes mellitus with other diabetic kidney complication: Secondary | ICD-10-CM | POA: Diagnosis not present

## 2015-10-15 DIAGNOSIS — D631 Anemia in chronic kidney disease: Secondary | ICD-10-CM | POA: Diagnosis not present

## 2015-10-15 DIAGNOSIS — D509 Iron deficiency anemia, unspecified: Secondary | ICD-10-CM | POA: Diagnosis not present

## 2015-10-15 DIAGNOSIS — N186 End stage renal disease: Secondary | ICD-10-CM | POA: Diagnosis not present

## 2015-10-15 DIAGNOSIS — N2581 Secondary hyperparathyroidism of renal origin: Secondary | ICD-10-CM | POA: Diagnosis not present

## 2015-10-17 DIAGNOSIS — E1129 Type 2 diabetes mellitus with other diabetic kidney complication: Secondary | ICD-10-CM | POA: Diagnosis not present

## 2015-10-17 DIAGNOSIS — N186 End stage renal disease: Secondary | ICD-10-CM | POA: Diagnosis not present

## 2015-10-17 DIAGNOSIS — D509 Iron deficiency anemia, unspecified: Secondary | ICD-10-CM | POA: Diagnosis not present

## 2015-10-17 DIAGNOSIS — D631 Anemia in chronic kidney disease: Secondary | ICD-10-CM | POA: Diagnosis not present

## 2015-10-17 DIAGNOSIS — N2581 Secondary hyperparathyroidism of renal origin: Secondary | ICD-10-CM | POA: Diagnosis not present

## 2015-10-19 DIAGNOSIS — N186 End stage renal disease: Secondary | ICD-10-CM | POA: Diagnosis not present

## 2015-10-19 DIAGNOSIS — D509 Iron deficiency anemia, unspecified: Secondary | ICD-10-CM | POA: Diagnosis not present

## 2015-10-19 DIAGNOSIS — N2581 Secondary hyperparathyroidism of renal origin: Secondary | ICD-10-CM | POA: Diagnosis not present

## 2015-10-19 DIAGNOSIS — Z992 Dependence on renal dialysis: Secondary | ICD-10-CM | POA: Diagnosis not present

## 2015-10-19 DIAGNOSIS — D631 Anemia in chronic kidney disease: Secondary | ICD-10-CM | POA: Diagnosis not present

## 2015-10-19 DIAGNOSIS — E1129 Type 2 diabetes mellitus with other diabetic kidney complication: Secondary | ICD-10-CM | POA: Diagnosis not present

## 2015-10-22 DIAGNOSIS — E1129 Type 2 diabetes mellitus with other diabetic kidney complication: Secondary | ICD-10-CM | POA: Diagnosis not present

## 2015-10-22 DIAGNOSIS — N186 End stage renal disease: Secondary | ICD-10-CM | POA: Diagnosis not present

## 2015-10-22 DIAGNOSIS — N2581 Secondary hyperparathyroidism of renal origin: Secondary | ICD-10-CM | POA: Diagnosis not present

## 2015-10-22 DIAGNOSIS — D631 Anemia in chronic kidney disease: Secondary | ICD-10-CM | POA: Diagnosis not present

## 2015-10-24 DIAGNOSIS — D631 Anemia in chronic kidney disease: Secondary | ICD-10-CM | POA: Diagnosis not present

## 2015-10-24 DIAGNOSIS — N186 End stage renal disease: Secondary | ICD-10-CM | POA: Diagnosis not present

## 2015-10-24 DIAGNOSIS — N2581 Secondary hyperparathyroidism of renal origin: Secondary | ICD-10-CM | POA: Diagnosis not present

## 2015-10-24 DIAGNOSIS — E1129 Type 2 diabetes mellitus with other diabetic kidney complication: Secondary | ICD-10-CM | POA: Diagnosis not present

## 2015-10-26 DIAGNOSIS — E1129 Type 2 diabetes mellitus with other diabetic kidney complication: Secondary | ICD-10-CM | POA: Diagnosis not present

## 2015-10-26 DIAGNOSIS — N186 End stage renal disease: Secondary | ICD-10-CM | POA: Diagnosis not present

## 2015-10-26 DIAGNOSIS — N2581 Secondary hyperparathyroidism of renal origin: Secondary | ICD-10-CM | POA: Diagnosis not present

## 2015-10-26 DIAGNOSIS — D631 Anemia in chronic kidney disease: Secondary | ICD-10-CM | POA: Diagnosis not present

## 2015-10-29 DIAGNOSIS — N2581 Secondary hyperparathyroidism of renal origin: Secondary | ICD-10-CM | POA: Diagnosis not present

## 2015-10-29 DIAGNOSIS — D631 Anemia in chronic kidney disease: Secondary | ICD-10-CM | POA: Diagnosis not present

## 2015-10-29 DIAGNOSIS — E1129 Type 2 diabetes mellitus with other diabetic kidney complication: Secondary | ICD-10-CM | POA: Diagnosis not present

## 2015-10-29 DIAGNOSIS — N186 End stage renal disease: Secondary | ICD-10-CM | POA: Diagnosis not present

## 2015-10-31 DIAGNOSIS — N186 End stage renal disease: Secondary | ICD-10-CM | POA: Diagnosis not present

## 2015-10-31 DIAGNOSIS — D631 Anemia in chronic kidney disease: Secondary | ICD-10-CM | POA: Diagnosis not present

## 2015-10-31 DIAGNOSIS — N2581 Secondary hyperparathyroidism of renal origin: Secondary | ICD-10-CM | POA: Diagnosis not present

## 2015-10-31 DIAGNOSIS — E1129 Type 2 diabetes mellitus with other diabetic kidney complication: Secondary | ICD-10-CM | POA: Diagnosis not present

## 2015-11-02 DIAGNOSIS — E1129 Type 2 diabetes mellitus with other diabetic kidney complication: Secondary | ICD-10-CM | POA: Diagnosis not present

## 2015-11-02 DIAGNOSIS — N2581 Secondary hyperparathyroidism of renal origin: Secondary | ICD-10-CM | POA: Diagnosis not present

## 2015-11-02 DIAGNOSIS — D631 Anemia in chronic kidney disease: Secondary | ICD-10-CM | POA: Diagnosis not present

## 2015-11-02 DIAGNOSIS — N186 End stage renal disease: Secondary | ICD-10-CM | POA: Diagnosis not present

## 2015-11-05 DIAGNOSIS — E1129 Type 2 diabetes mellitus with other diabetic kidney complication: Secondary | ICD-10-CM | POA: Diagnosis not present

## 2015-11-05 DIAGNOSIS — D631 Anemia in chronic kidney disease: Secondary | ICD-10-CM | POA: Diagnosis not present

## 2015-11-05 DIAGNOSIS — N2581 Secondary hyperparathyroidism of renal origin: Secondary | ICD-10-CM | POA: Diagnosis not present

## 2015-11-05 DIAGNOSIS — N186 End stage renal disease: Secondary | ICD-10-CM | POA: Diagnosis not present

## 2015-11-07 DIAGNOSIS — N390 Urinary tract infection, site not specified: Secondary | ICD-10-CM | POA: Diagnosis not present

## 2015-11-07 DIAGNOSIS — N186 End stage renal disease: Secondary | ICD-10-CM | POA: Diagnosis not present

## 2015-11-07 DIAGNOSIS — N2581 Secondary hyperparathyroidism of renal origin: Secondary | ICD-10-CM | POA: Diagnosis not present

## 2015-11-07 DIAGNOSIS — E1129 Type 2 diabetes mellitus with other diabetic kidney complication: Secondary | ICD-10-CM | POA: Diagnosis not present

## 2015-11-07 DIAGNOSIS — D631 Anemia in chronic kidney disease: Secondary | ICD-10-CM | POA: Diagnosis not present

## 2015-11-09 DIAGNOSIS — D631 Anemia in chronic kidney disease: Secondary | ICD-10-CM | POA: Diagnosis not present

## 2015-11-09 DIAGNOSIS — N186 End stage renal disease: Secondary | ICD-10-CM | POA: Diagnosis not present

## 2015-11-09 DIAGNOSIS — E1129 Type 2 diabetes mellitus with other diabetic kidney complication: Secondary | ICD-10-CM | POA: Diagnosis not present

## 2015-11-09 DIAGNOSIS — N2581 Secondary hyperparathyroidism of renal origin: Secondary | ICD-10-CM | POA: Diagnosis not present

## 2015-11-12 DIAGNOSIS — N2581 Secondary hyperparathyroidism of renal origin: Secondary | ICD-10-CM | POA: Diagnosis not present

## 2015-11-12 DIAGNOSIS — N186 End stage renal disease: Secondary | ICD-10-CM | POA: Diagnosis not present

## 2015-11-12 DIAGNOSIS — D631 Anemia in chronic kidney disease: Secondary | ICD-10-CM | POA: Diagnosis not present

## 2015-11-12 DIAGNOSIS — E1129 Type 2 diabetes mellitus with other diabetic kidney complication: Secondary | ICD-10-CM | POA: Diagnosis not present

## 2015-11-14 DIAGNOSIS — N186 End stage renal disease: Secondary | ICD-10-CM | POA: Diagnosis not present

## 2015-11-14 DIAGNOSIS — E1129 Type 2 diabetes mellitus with other diabetic kidney complication: Secondary | ICD-10-CM | POA: Diagnosis not present

## 2015-11-14 DIAGNOSIS — D631 Anemia in chronic kidney disease: Secondary | ICD-10-CM | POA: Diagnosis not present

## 2015-11-14 DIAGNOSIS — N2581 Secondary hyperparathyroidism of renal origin: Secondary | ICD-10-CM | POA: Diagnosis not present

## 2015-11-16 DIAGNOSIS — N186 End stage renal disease: Secondary | ICD-10-CM | POA: Diagnosis not present

## 2015-11-16 DIAGNOSIS — D631 Anemia in chronic kidney disease: Secondary | ICD-10-CM | POA: Diagnosis not present

## 2015-11-16 DIAGNOSIS — E1129 Type 2 diabetes mellitus with other diabetic kidney complication: Secondary | ICD-10-CM | POA: Diagnosis not present

## 2015-11-16 DIAGNOSIS — N2581 Secondary hyperparathyroidism of renal origin: Secondary | ICD-10-CM | POA: Diagnosis not present

## 2015-11-18 DIAGNOSIS — E1129 Type 2 diabetes mellitus with other diabetic kidney complication: Secondary | ICD-10-CM | POA: Diagnosis not present

## 2015-11-18 DIAGNOSIS — N186 End stage renal disease: Secondary | ICD-10-CM | POA: Diagnosis not present

## 2015-11-18 DIAGNOSIS — Z992 Dependence on renal dialysis: Secondary | ICD-10-CM | POA: Diagnosis not present

## 2015-11-19 DIAGNOSIS — N2581 Secondary hyperparathyroidism of renal origin: Secondary | ICD-10-CM | POA: Diagnosis not present

## 2015-11-19 DIAGNOSIS — E1129 Type 2 diabetes mellitus with other diabetic kidney complication: Secondary | ICD-10-CM | POA: Diagnosis not present

## 2015-11-19 DIAGNOSIS — N186 End stage renal disease: Secondary | ICD-10-CM | POA: Diagnosis not present

## 2015-11-19 DIAGNOSIS — D631 Anemia in chronic kidney disease: Secondary | ICD-10-CM | POA: Diagnosis not present

## 2015-11-21 DIAGNOSIS — N186 End stage renal disease: Secondary | ICD-10-CM | POA: Diagnosis not present

## 2015-11-21 DIAGNOSIS — E1129 Type 2 diabetes mellitus with other diabetic kidney complication: Secondary | ICD-10-CM | POA: Diagnosis not present

## 2015-11-21 DIAGNOSIS — D631 Anemia in chronic kidney disease: Secondary | ICD-10-CM | POA: Diagnosis not present

## 2015-11-21 DIAGNOSIS — N2581 Secondary hyperparathyroidism of renal origin: Secondary | ICD-10-CM | POA: Diagnosis not present

## 2015-11-23 DIAGNOSIS — E1129 Type 2 diabetes mellitus with other diabetic kidney complication: Secondary | ICD-10-CM | POA: Diagnosis not present

## 2015-11-23 DIAGNOSIS — N186 End stage renal disease: Secondary | ICD-10-CM | POA: Diagnosis not present

## 2015-11-23 DIAGNOSIS — N2581 Secondary hyperparathyroidism of renal origin: Secondary | ICD-10-CM | POA: Diagnosis not present

## 2015-11-23 DIAGNOSIS — D631 Anemia in chronic kidney disease: Secondary | ICD-10-CM | POA: Diagnosis not present

## 2015-11-26 DIAGNOSIS — E1129 Type 2 diabetes mellitus with other diabetic kidney complication: Secondary | ICD-10-CM | POA: Diagnosis not present

## 2015-11-26 DIAGNOSIS — D631 Anemia in chronic kidney disease: Secondary | ICD-10-CM | POA: Diagnosis not present

## 2015-11-26 DIAGNOSIS — N2581 Secondary hyperparathyroidism of renal origin: Secondary | ICD-10-CM | POA: Diagnosis not present

## 2015-11-26 DIAGNOSIS — N186 End stage renal disease: Secondary | ICD-10-CM | POA: Diagnosis not present

## 2015-11-28 DIAGNOSIS — N2581 Secondary hyperparathyroidism of renal origin: Secondary | ICD-10-CM | POA: Diagnosis not present

## 2015-11-28 DIAGNOSIS — D631 Anemia in chronic kidney disease: Secondary | ICD-10-CM | POA: Diagnosis not present

## 2015-11-28 DIAGNOSIS — E1129 Type 2 diabetes mellitus with other diabetic kidney complication: Secondary | ICD-10-CM | POA: Diagnosis not present

## 2015-11-28 DIAGNOSIS — N186 End stage renal disease: Secondary | ICD-10-CM | POA: Diagnosis not present

## 2015-11-30 DIAGNOSIS — N186 End stage renal disease: Secondary | ICD-10-CM | POA: Diagnosis not present

## 2015-11-30 DIAGNOSIS — E1129 Type 2 diabetes mellitus with other diabetic kidney complication: Secondary | ICD-10-CM | POA: Diagnosis not present

## 2015-11-30 DIAGNOSIS — D631 Anemia in chronic kidney disease: Secondary | ICD-10-CM | POA: Diagnosis not present

## 2015-11-30 DIAGNOSIS — N2581 Secondary hyperparathyroidism of renal origin: Secondary | ICD-10-CM | POA: Diagnosis not present

## 2015-12-03 DIAGNOSIS — N2581 Secondary hyperparathyroidism of renal origin: Secondary | ICD-10-CM | POA: Diagnosis not present

## 2015-12-03 DIAGNOSIS — E1129 Type 2 diabetes mellitus with other diabetic kidney complication: Secondary | ICD-10-CM | POA: Diagnosis not present

## 2015-12-03 DIAGNOSIS — N186 End stage renal disease: Secondary | ICD-10-CM | POA: Diagnosis not present

## 2015-12-03 DIAGNOSIS — D631 Anemia in chronic kidney disease: Secondary | ICD-10-CM | POA: Diagnosis not present

## 2015-12-05 DIAGNOSIS — N2581 Secondary hyperparathyroidism of renal origin: Secondary | ICD-10-CM | POA: Diagnosis not present

## 2015-12-05 DIAGNOSIS — N186 End stage renal disease: Secondary | ICD-10-CM | POA: Diagnosis not present

## 2015-12-05 DIAGNOSIS — D631 Anemia in chronic kidney disease: Secondary | ICD-10-CM | POA: Diagnosis not present

## 2015-12-05 DIAGNOSIS — E1129 Type 2 diabetes mellitus with other diabetic kidney complication: Secondary | ICD-10-CM | POA: Diagnosis not present

## 2015-12-06 DIAGNOSIS — H3500 Unspecified background retinopathy: Secondary | ICD-10-CM | POA: Diagnosis not present

## 2015-12-06 DIAGNOSIS — Z1389 Encounter for screening for other disorder: Secondary | ICD-10-CM | POA: Diagnosis not present

## 2015-12-06 DIAGNOSIS — E1129 Type 2 diabetes mellitus with other diabetic kidney complication: Secondary | ICD-10-CM | POA: Diagnosis not present

## 2015-12-06 DIAGNOSIS — N186 End stage renal disease: Secondary | ICD-10-CM | POA: Diagnosis not present

## 2015-12-06 DIAGNOSIS — F39 Unspecified mood [affective] disorder: Secondary | ICD-10-CM | POA: Diagnosis not present

## 2015-12-06 DIAGNOSIS — Z6834 Body mass index (BMI) 34.0-34.9, adult: Secondary | ICD-10-CM | POA: Diagnosis not present

## 2015-12-07 DIAGNOSIS — N186 End stage renal disease: Secondary | ICD-10-CM | POA: Diagnosis not present

## 2015-12-07 DIAGNOSIS — D631 Anemia in chronic kidney disease: Secondary | ICD-10-CM | POA: Diagnosis not present

## 2015-12-07 DIAGNOSIS — E1129 Type 2 diabetes mellitus with other diabetic kidney complication: Secondary | ICD-10-CM | POA: Diagnosis not present

## 2015-12-07 DIAGNOSIS — N2581 Secondary hyperparathyroidism of renal origin: Secondary | ICD-10-CM | POA: Diagnosis not present

## 2015-12-10 DIAGNOSIS — D631 Anemia in chronic kidney disease: Secondary | ICD-10-CM | POA: Diagnosis not present

## 2015-12-10 DIAGNOSIS — N186 End stage renal disease: Secondary | ICD-10-CM | POA: Diagnosis not present

## 2015-12-10 DIAGNOSIS — N2581 Secondary hyperparathyroidism of renal origin: Secondary | ICD-10-CM | POA: Diagnosis not present

## 2015-12-10 DIAGNOSIS — E1129 Type 2 diabetes mellitus with other diabetic kidney complication: Secondary | ICD-10-CM | POA: Diagnosis not present

## 2015-12-12 DIAGNOSIS — D631 Anemia in chronic kidney disease: Secondary | ICD-10-CM | POA: Diagnosis not present

## 2015-12-12 DIAGNOSIS — N186 End stage renal disease: Secondary | ICD-10-CM | POA: Diagnosis not present

## 2015-12-12 DIAGNOSIS — N2581 Secondary hyperparathyroidism of renal origin: Secondary | ICD-10-CM | POA: Diagnosis not present

## 2015-12-12 DIAGNOSIS — E1129 Type 2 diabetes mellitus with other diabetic kidney complication: Secondary | ICD-10-CM | POA: Diagnosis not present

## 2015-12-14 DIAGNOSIS — N186 End stage renal disease: Secondary | ICD-10-CM | POA: Diagnosis not present

## 2015-12-14 DIAGNOSIS — E1129 Type 2 diabetes mellitus with other diabetic kidney complication: Secondary | ICD-10-CM | POA: Diagnosis not present

## 2015-12-14 DIAGNOSIS — N2581 Secondary hyperparathyroidism of renal origin: Secondary | ICD-10-CM | POA: Diagnosis not present

## 2015-12-14 DIAGNOSIS — D631 Anemia in chronic kidney disease: Secondary | ICD-10-CM | POA: Diagnosis not present

## 2015-12-17 DIAGNOSIS — E1129 Type 2 diabetes mellitus with other diabetic kidney complication: Secondary | ICD-10-CM | POA: Diagnosis not present

## 2015-12-17 DIAGNOSIS — D631 Anemia in chronic kidney disease: Secondary | ICD-10-CM | POA: Diagnosis not present

## 2015-12-17 DIAGNOSIS — N2581 Secondary hyperparathyroidism of renal origin: Secondary | ICD-10-CM | POA: Diagnosis not present

## 2015-12-17 DIAGNOSIS — N186 End stage renal disease: Secondary | ICD-10-CM | POA: Diagnosis not present

## 2015-12-19 DIAGNOSIS — N186 End stage renal disease: Secondary | ICD-10-CM | POA: Diagnosis not present

## 2015-12-19 DIAGNOSIS — N2581 Secondary hyperparathyroidism of renal origin: Secondary | ICD-10-CM | POA: Diagnosis not present

## 2015-12-19 DIAGNOSIS — E1129 Type 2 diabetes mellitus with other diabetic kidney complication: Secondary | ICD-10-CM | POA: Diagnosis not present

## 2015-12-19 DIAGNOSIS — D631 Anemia in chronic kidney disease: Secondary | ICD-10-CM | POA: Diagnosis not present

## 2015-12-19 DIAGNOSIS — Z992 Dependence on renal dialysis: Secondary | ICD-10-CM | POA: Diagnosis not present

## 2015-12-21 DIAGNOSIS — E8779 Other fluid overload: Secondary | ICD-10-CM | POA: Diagnosis not present

## 2015-12-21 DIAGNOSIS — D631 Anemia in chronic kidney disease: Secondary | ICD-10-CM | POA: Diagnosis not present

## 2015-12-21 DIAGNOSIS — N186 End stage renal disease: Secondary | ICD-10-CM | POA: Diagnosis not present

## 2015-12-21 DIAGNOSIS — E1129 Type 2 diabetes mellitus with other diabetic kidney complication: Secondary | ICD-10-CM | POA: Diagnosis not present

## 2015-12-21 DIAGNOSIS — N2581 Secondary hyperparathyroidism of renal origin: Secondary | ICD-10-CM | POA: Diagnosis not present

## 2015-12-24 DIAGNOSIS — E1129 Type 2 diabetes mellitus with other diabetic kidney complication: Secondary | ICD-10-CM | POA: Diagnosis not present

## 2015-12-24 DIAGNOSIS — E8779 Other fluid overload: Secondary | ICD-10-CM | POA: Diagnosis not present

## 2015-12-24 DIAGNOSIS — N2581 Secondary hyperparathyroidism of renal origin: Secondary | ICD-10-CM | POA: Diagnosis not present

## 2015-12-24 DIAGNOSIS — D631 Anemia in chronic kidney disease: Secondary | ICD-10-CM | POA: Diagnosis not present

## 2015-12-24 DIAGNOSIS — N186 End stage renal disease: Secondary | ICD-10-CM | POA: Diagnosis not present

## 2015-12-26 DIAGNOSIS — N2581 Secondary hyperparathyroidism of renal origin: Secondary | ICD-10-CM | POA: Diagnosis not present

## 2015-12-26 DIAGNOSIS — D631 Anemia in chronic kidney disease: Secondary | ICD-10-CM | POA: Diagnosis not present

## 2015-12-26 DIAGNOSIS — E1129 Type 2 diabetes mellitus with other diabetic kidney complication: Secondary | ICD-10-CM | POA: Diagnosis not present

## 2015-12-26 DIAGNOSIS — N186 End stage renal disease: Secondary | ICD-10-CM | POA: Diagnosis not present

## 2015-12-26 DIAGNOSIS — E8779 Other fluid overload: Secondary | ICD-10-CM | POA: Diagnosis not present

## 2015-12-28 DIAGNOSIS — N2581 Secondary hyperparathyroidism of renal origin: Secondary | ICD-10-CM | POA: Diagnosis not present

## 2015-12-28 DIAGNOSIS — E8779 Other fluid overload: Secondary | ICD-10-CM | POA: Diagnosis not present

## 2015-12-28 DIAGNOSIS — E1129 Type 2 diabetes mellitus with other diabetic kidney complication: Secondary | ICD-10-CM | POA: Diagnosis not present

## 2015-12-28 DIAGNOSIS — N186 End stage renal disease: Secondary | ICD-10-CM | POA: Diagnosis not present

## 2015-12-28 DIAGNOSIS — D631 Anemia in chronic kidney disease: Secondary | ICD-10-CM | POA: Diagnosis not present

## 2015-12-31 DIAGNOSIS — E1129 Type 2 diabetes mellitus with other diabetic kidney complication: Secondary | ICD-10-CM | POA: Diagnosis not present

## 2015-12-31 DIAGNOSIS — D631 Anemia in chronic kidney disease: Secondary | ICD-10-CM | POA: Diagnosis not present

## 2015-12-31 DIAGNOSIS — N186 End stage renal disease: Secondary | ICD-10-CM | POA: Diagnosis not present

## 2015-12-31 DIAGNOSIS — E8779 Other fluid overload: Secondary | ICD-10-CM | POA: Diagnosis not present

## 2015-12-31 DIAGNOSIS — N2581 Secondary hyperparathyroidism of renal origin: Secondary | ICD-10-CM | POA: Diagnosis not present

## 2016-01-02 DIAGNOSIS — D631 Anemia in chronic kidney disease: Secondary | ICD-10-CM | POA: Diagnosis not present

## 2016-01-02 DIAGNOSIS — E8779 Other fluid overload: Secondary | ICD-10-CM | POA: Diagnosis not present

## 2016-01-02 DIAGNOSIS — N186 End stage renal disease: Secondary | ICD-10-CM | POA: Diagnosis not present

## 2016-01-02 DIAGNOSIS — E1129 Type 2 diabetes mellitus with other diabetic kidney complication: Secondary | ICD-10-CM | POA: Diagnosis not present

## 2016-01-02 DIAGNOSIS — N2581 Secondary hyperparathyroidism of renal origin: Secondary | ICD-10-CM | POA: Diagnosis not present

## 2016-01-04 DIAGNOSIS — D631 Anemia in chronic kidney disease: Secondary | ICD-10-CM | POA: Diagnosis not present

## 2016-01-04 DIAGNOSIS — N2581 Secondary hyperparathyroidism of renal origin: Secondary | ICD-10-CM | POA: Diagnosis not present

## 2016-01-04 DIAGNOSIS — N186 End stage renal disease: Secondary | ICD-10-CM | POA: Diagnosis not present

## 2016-01-04 DIAGNOSIS — E1129 Type 2 diabetes mellitus with other diabetic kidney complication: Secondary | ICD-10-CM | POA: Diagnosis not present

## 2016-01-04 DIAGNOSIS — E8779 Other fluid overload: Secondary | ICD-10-CM | POA: Diagnosis not present

## 2016-01-07 DIAGNOSIS — N186 End stage renal disease: Secondary | ICD-10-CM | POA: Diagnosis not present

## 2016-01-07 DIAGNOSIS — D631 Anemia in chronic kidney disease: Secondary | ICD-10-CM | POA: Diagnosis not present

## 2016-01-07 DIAGNOSIS — E8779 Other fluid overload: Secondary | ICD-10-CM | POA: Diagnosis not present

## 2016-01-07 DIAGNOSIS — N2581 Secondary hyperparathyroidism of renal origin: Secondary | ICD-10-CM | POA: Diagnosis not present

## 2016-01-07 DIAGNOSIS — E1129 Type 2 diabetes mellitus with other diabetic kidney complication: Secondary | ICD-10-CM | POA: Diagnosis not present

## 2016-01-08 ENCOUNTER — Ambulatory Visit: Payer: Medicare Other | Admitting: Hematology and Oncology

## 2016-01-08 NOTE — Assessment & Plan Note (Deleted)
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 Breast exam 01/08/2016: Normal 2. Mammogram 11/30/2014 : Benign dystrophic calcifications posterior left breast, postsurgical changes  History of renal insufficiency History of anemia of chronic kidney disease  Return to clinic in 1 year with survivorship clinic

## 2016-01-09 DIAGNOSIS — N186 End stage renal disease: Secondary | ICD-10-CM | POA: Diagnosis not present

## 2016-01-09 DIAGNOSIS — E8779 Other fluid overload: Secondary | ICD-10-CM | POA: Diagnosis not present

## 2016-01-09 DIAGNOSIS — E1129 Type 2 diabetes mellitus with other diabetic kidney complication: Secondary | ICD-10-CM | POA: Diagnosis not present

## 2016-01-09 DIAGNOSIS — D631 Anemia in chronic kidney disease: Secondary | ICD-10-CM | POA: Diagnosis not present

## 2016-01-09 DIAGNOSIS — N2581 Secondary hyperparathyroidism of renal origin: Secondary | ICD-10-CM | POA: Diagnosis not present

## 2016-01-11 DIAGNOSIS — D631 Anemia in chronic kidney disease: Secondary | ICD-10-CM | POA: Diagnosis not present

## 2016-01-11 DIAGNOSIS — E1129 Type 2 diabetes mellitus with other diabetic kidney complication: Secondary | ICD-10-CM | POA: Diagnosis not present

## 2016-01-11 DIAGNOSIS — E8779 Other fluid overload: Secondary | ICD-10-CM | POA: Diagnosis not present

## 2016-01-11 DIAGNOSIS — N186 End stage renal disease: Secondary | ICD-10-CM | POA: Diagnosis not present

## 2016-01-11 DIAGNOSIS — N2581 Secondary hyperparathyroidism of renal origin: Secondary | ICD-10-CM | POA: Diagnosis not present

## 2016-01-14 DIAGNOSIS — E8779 Other fluid overload: Secondary | ICD-10-CM | POA: Diagnosis not present

## 2016-01-14 DIAGNOSIS — N186 End stage renal disease: Secondary | ICD-10-CM | POA: Diagnosis not present

## 2016-01-14 DIAGNOSIS — N2581 Secondary hyperparathyroidism of renal origin: Secondary | ICD-10-CM | POA: Diagnosis not present

## 2016-01-14 DIAGNOSIS — D631 Anemia in chronic kidney disease: Secondary | ICD-10-CM | POA: Diagnosis not present

## 2016-01-14 DIAGNOSIS — E1129 Type 2 diabetes mellitus with other diabetic kidney complication: Secondary | ICD-10-CM | POA: Diagnosis not present

## 2016-01-16 DIAGNOSIS — D631 Anemia in chronic kidney disease: Secondary | ICD-10-CM | POA: Diagnosis not present

## 2016-01-16 DIAGNOSIS — E8779 Other fluid overload: Secondary | ICD-10-CM | POA: Diagnosis not present

## 2016-01-16 DIAGNOSIS — E1129 Type 2 diabetes mellitus with other diabetic kidney complication: Secondary | ICD-10-CM | POA: Diagnosis not present

## 2016-01-16 DIAGNOSIS — N2581 Secondary hyperparathyroidism of renal origin: Secondary | ICD-10-CM | POA: Diagnosis not present

## 2016-01-16 DIAGNOSIS — N186 End stage renal disease: Secondary | ICD-10-CM | POA: Diagnosis not present

## 2016-01-17 DIAGNOSIS — D631 Anemia in chronic kidney disease: Secondary | ICD-10-CM | POA: Diagnosis not present

## 2016-01-17 DIAGNOSIS — N2581 Secondary hyperparathyroidism of renal origin: Secondary | ICD-10-CM | POA: Diagnosis not present

## 2016-01-17 DIAGNOSIS — E8779 Other fluid overload: Secondary | ICD-10-CM | POA: Diagnosis not present

## 2016-01-17 DIAGNOSIS — E1129 Type 2 diabetes mellitus with other diabetic kidney complication: Secondary | ICD-10-CM | POA: Diagnosis not present

## 2016-01-17 DIAGNOSIS — N186 End stage renal disease: Secondary | ICD-10-CM | POA: Diagnosis not present

## 2016-01-18 DIAGNOSIS — E8779 Other fluid overload: Secondary | ICD-10-CM | POA: Diagnosis not present

## 2016-01-18 DIAGNOSIS — E1129 Type 2 diabetes mellitus with other diabetic kidney complication: Secondary | ICD-10-CM | POA: Diagnosis not present

## 2016-01-18 DIAGNOSIS — Z992 Dependence on renal dialysis: Secondary | ICD-10-CM | POA: Diagnosis not present

## 2016-01-18 DIAGNOSIS — N186 End stage renal disease: Secondary | ICD-10-CM | POA: Diagnosis not present

## 2016-01-18 DIAGNOSIS — D631 Anemia in chronic kidney disease: Secondary | ICD-10-CM | POA: Diagnosis not present

## 2016-01-18 DIAGNOSIS — N2581 Secondary hyperparathyroidism of renal origin: Secondary | ICD-10-CM | POA: Diagnosis not present

## 2016-01-21 DIAGNOSIS — D631 Anemia in chronic kidney disease: Secondary | ICD-10-CM | POA: Diagnosis not present

## 2016-01-21 DIAGNOSIS — N2581 Secondary hyperparathyroidism of renal origin: Secondary | ICD-10-CM | POA: Diagnosis not present

## 2016-01-21 DIAGNOSIS — E1129 Type 2 diabetes mellitus with other diabetic kidney complication: Secondary | ICD-10-CM | POA: Diagnosis not present

## 2016-01-21 DIAGNOSIS — N186 End stage renal disease: Secondary | ICD-10-CM | POA: Diagnosis not present

## 2016-01-23 DIAGNOSIS — D631 Anemia in chronic kidney disease: Secondary | ICD-10-CM | POA: Diagnosis not present

## 2016-01-23 DIAGNOSIS — N2581 Secondary hyperparathyroidism of renal origin: Secondary | ICD-10-CM | POA: Diagnosis not present

## 2016-01-23 DIAGNOSIS — E1129 Type 2 diabetes mellitus with other diabetic kidney complication: Secondary | ICD-10-CM | POA: Diagnosis not present

## 2016-01-23 DIAGNOSIS — N186 End stage renal disease: Secondary | ICD-10-CM | POA: Diagnosis not present

## 2016-01-25 DIAGNOSIS — N186 End stage renal disease: Secondary | ICD-10-CM | POA: Diagnosis not present

## 2016-01-25 DIAGNOSIS — D631 Anemia in chronic kidney disease: Secondary | ICD-10-CM | POA: Diagnosis not present

## 2016-01-25 DIAGNOSIS — N2581 Secondary hyperparathyroidism of renal origin: Secondary | ICD-10-CM | POA: Diagnosis not present

## 2016-01-25 DIAGNOSIS — E1129 Type 2 diabetes mellitus with other diabetic kidney complication: Secondary | ICD-10-CM | POA: Diagnosis not present

## 2016-01-28 DIAGNOSIS — N2581 Secondary hyperparathyroidism of renal origin: Secondary | ICD-10-CM | POA: Diagnosis not present

## 2016-01-28 DIAGNOSIS — N186 End stage renal disease: Secondary | ICD-10-CM | POA: Diagnosis not present

## 2016-01-28 DIAGNOSIS — D631 Anemia in chronic kidney disease: Secondary | ICD-10-CM | POA: Diagnosis not present

## 2016-01-28 DIAGNOSIS — E1129 Type 2 diabetes mellitus with other diabetic kidney complication: Secondary | ICD-10-CM | POA: Diagnosis not present

## 2016-01-30 DIAGNOSIS — N2581 Secondary hyperparathyroidism of renal origin: Secondary | ICD-10-CM | POA: Diagnosis not present

## 2016-01-30 DIAGNOSIS — D631 Anemia in chronic kidney disease: Secondary | ICD-10-CM | POA: Diagnosis not present

## 2016-01-30 DIAGNOSIS — E1129 Type 2 diabetes mellitus with other diabetic kidney complication: Secondary | ICD-10-CM | POA: Diagnosis not present

## 2016-01-30 DIAGNOSIS — N186 End stage renal disease: Secondary | ICD-10-CM | POA: Diagnosis not present

## 2016-02-01 DIAGNOSIS — N186 End stage renal disease: Secondary | ICD-10-CM | POA: Diagnosis not present

## 2016-02-01 DIAGNOSIS — D631 Anemia in chronic kidney disease: Secondary | ICD-10-CM | POA: Diagnosis not present

## 2016-02-01 DIAGNOSIS — E1129 Type 2 diabetes mellitus with other diabetic kidney complication: Secondary | ICD-10-CM | POA: Diagnosis not present

## 2016-02-01 DIAGNOSIS — N2581 Secondary hyperparathyroidism of renal origin: Secondary | ICD-10-CM | POA: Diagnosis not present

## 2016-02-04 DIAGNOSIS — N2581 Secondary hyperparathyroidism of renal origin: Secondary | ICD-10-CM | POA: Diagnosis not present

## 2016-02-04 DIAGNOSIS — N186 End stage renal disease: Secondary | ICD-10-CM | POA: Diagnosis not present

## 2016-02-04 DIAGNOSIS — E1129 Type 2 diabetes mellitus with other diabetic kidney complication: Secondary | ICD-10-CM | POA: Diagnosis not present

## 2016-02-04 DIAGNOSIS — D631 Anemia in chronic kidney disease: Secondary | ICD-10-CM | POA: Diagnosis not present

## 2016-02-06 DIAGNOSIS — N186 End stage renal disease: Secondary | ICD-10-CM | POA: Diagnosis not present

## 2016-02-06 DIAGNOSIS — N2581 Secondary hyperparathyroidism of renal origin: Secondary | ICD-10-CM | POA: Diagnosis not present

## 2016-02-06 DIAGNOSIS — D631 Anemia in chronic kidney disease: Secondary | ICD-10-CM | POA: Diagnosis not present

## 2016-02-06 DIAGNOSIS — E1129 Type 2 diabetes mellitus with other diabetic kidney complication: Secondary | ICD-10-CM | POA: Diagnosis not present

## 2016-02-08 DIAGNOSIS — N2581 Secondary hyperparathyroidism of renal origin: Secondary | ICD-10-CM | POA: Diagnosis not present

## 2016-02-08 DIAGNOSIS — E1129 Type 2 diabetes mellitus with other diabetic kidney complication: Secondary | ICD-10-CM | POA: Diagnosis not present

## 2016-02-08 DIAGNOSIS — N186 End stage renal disease: Secondary | ICD-10-CM | POA: Diagnosis not present

## 2016-02-08 DIAGNOSIS — D631 Anemia in chronic kidney disease: Secondary | ICD-10-CM | POA: Diagnosis not present

## 2016-02-11 DIAGNOSIS — N2581 Secondary hyperparathyroidism of renal origin: Secondary | ICD-10-CM | POA: Diagnosis not present

## 2016-02-11 DIAGNOSIS — N186 End stage renal disease: Secondary | ICD-10-CM | POA: Diagnosis not present

## 2016-02-11 DIAGNOSIS — D631 Anemia in chronic kidney disease: Secondary | ICD-10-CM | POA: Diagnosis not present

## 2016-02-11 DIAGNOSIS — E1129 Type 2 diabetes mellitus with other diabetic kidney complication: Secondary | ICD-10-CM | POA: Diagnosis not present

## 2016-02-13 DIAGNOSIS — E1129 Type 2 diabetes mellitus with other diabetic kidney complication: Secondary | ICD-10-CM | POA: Diagnosis not present

## 2016-02-13 DIAGNOSIS — D631 Anemia in chronic kidney disease: Secondary | ICD-10-CM | POA: Diagnosis not present

## 2016-02-13 DIAGNOSIS — N186 End stage renal disease: Secondary | ICD-10-CM | POA: Diagnosis not present

## 2016-02-13 DIAGNOSIS — N2581 Secondary hyperparathyroidism of renal origin: Secondary | ICD-10-CM | POA: Diagnosis not present

## 2016-02-15 DIAGNOSIS — N186 End stage renal disease: Secondary | ICD-10-CM | POA: Diagnosis not present

## 2016-02-15 DIAGNOSIS — D631 Anemia in chronic kidney disease: Secondary | ICD-10-CM | POA: Diagnosis not present

## 2016-02-15 DIAGNOSIS — E1129 Type 2 diabetes mellitus with other diabetic kidney complication: Secondary | ICD-10-CM | POA: Diagnosis not present

## 2016-02-15 DIAGNOSIS — N2581 Secondary hyperparathyroidism of renal origin: Secondary | ICD-10-CM | POA: Diagnosis not present

## 2016-02-18 DIAGNOSIS — N2581 Secondary hyperparathyroidism of renal origin: Secondary | ICD-10-CM | POA: Diagnosis not present

## 2016-02-18 DIAGNOSIS — N186 End stage renal disease: Secondary | ICD-10-CM | POA: Diagnosis not present

## 2016-02-18 DIAGNOSIS — D631 Anemia in chronic kidney disease: Secondary | ICD-10-CM | POA: Diagnosis not present

## 2016-02-18 DIAGNOSIS — Z992 Dependence on renal dialysis: Secondary | ICD-10-CM | POA: Diagnosis not present

## 2016-02-18 DIAGNOSIS — E1129 Type 2 diabetes mellitus with other diabetic kidney complication: Secondary | ICD-10-CM | POA: Diagnosis not present

## 2016-02-19 DIAGNOSIS — E113551 Type 2 diabetes mellitus with stable proliferative diabetic retinopathy, right eye: Secondary | ICD-10-CM | POA: Diagnosis not present

## 2016-02-19 DIAGNOSIS — H35361 Drusen (degenerative) of macula, right eye: Secondary | ICD-10-CM | POA: Diagnosis not present

## 2016-02-19 DIAGNOSIS — H353212 Exudative age-related macular degeneration, right eye, with inactive choroidal neovascularization: Secondary | ICD-10-CM | POA: Diagnosis not present

## 2016-02-19 DIAGNOSIS — H31009 Unspecified chorioretinal scars, unspecified eye: Secondary | ICD-10-CM | POA: Diagnosis not present

## 2016-02-20 DIAGNOSIS — D631 Anemia in chronic kidney disease: Secondary | ICD-10-CM | POA: Diagnosis not present

## 2016-02-20 DIAGNOSIS — N2581 Secondary hyperparathyroidism of renal origin: Secondary | ICD-10-CM | POA: Diagnosis not present

## 2016-02-20 DIAGNOSIS — E1129 Type 2 diabetes mellitus with other diabetic kidney complication: Secondary | ICD-10-CM | POA: Diagnosis not present

## 2016-02-20 DIAGNOSIS — N186 End stage renal disease: Secondary | ICD-10-CM | POA: Diagnosis not present

## 2016-02-22 DIAGNOSIS — D631 Anemia in chronic kidney disease: Secondary | ICD-10-CM | POA: Diagnosis not present

## 2016-02-22 DIAGNOSIS — N2581 Secondary hyperparathyroidism of renal origin: Secondary | ICD-10-CM | POA: Diagnosis not present

## 2016-02-22 DIAGNOSIS — N186 End stage renal disease: Secondary | ICD-10-CM | POA: Diagnosis not present

## 2016-02-22 DIAGNOSIS — E1129 Type 2 diabetes mellitus with other diabetic kidney complication: Secondary | ICD-10-CM | POA: Diagnosis not present

## 2016-02-25 DIAGNOSIS — E1129 Type 2 diabetes mellitus with other diabetic kidney complication: Secondary | ICD-10-CM | POA: Diagnosis not present

## 2016-02-25 DIAGNOSIS — D631 Anemia in chronic kidney disease: Secondary | ICD-10-CM | POA: Diagnosis not present

## 2016-02-25 DIAGNOSIS — N186 End stage renal disease: Secondary | ICD-10-CM | POA: Diagnosis not present

## 2016-02-25 DIAGNOSIS — N2581 Secondary hyperparathyroidism of renal origin: Secondary | ICD-10-CM | POA: Diagnosis not present

## 2016-02-27 DIAGNOSIS — D631 Anemia in chronic kidney disease: Secondary | ICD-10-CM | POA: Diagnosis not present

## 2016-02-27 DIAGNOSIS — E1129 Type 2 diabetes mellitus with other diabetic kidney complication: Secondary | ICD-10-CM | POA: Diagnosis not present

## 2016-02-27 DIAGNOSIS — N2581 Secondary hyperparathyroidism of renal origin: Secondary | ICD-10-CM | POA: Diagnosis not present

## 2016-02-27 DIAGNOSIS — N186 End stage renal disease: Secondary | ICD-10-CM | POA: Diagnosis not present

## 2016-02-29 DIAGNOSIS — N186 End stage renal disease: Secondary | ICD-10-CM | POA: Diagnosis not present

## 2016-02-29 DIAGNOSIS — D631 Anemia in chronic kidney disease: Secondary | ICD-10-CM | POA: Diagnosis not present

## 2016-02-29 DIAGNOSIS — N2581 Secondary hyperparathyroidism of renal origin: Secondary | ICD-10-CM | POA: Diagnosis not present

## 2016-02-29 DIAGNOSIS — E1129 Type 2 diabetes mellitus with other diabetic kidney complication: Secondary | ICD-10-CM | POA: Diagnosis not present

## 2016-03-03 DIAGNOSIS — N2581 Secondary hyperparathyroidism of renal origin: Secondary | ICD-10-CM | POA: Diagnosis not present

## 2016-03-03 DIAGNOSIS — D631 Anemia in chronic kidney disease: Secondary | ICD-10-CM | POA: Diagnosis not present

## 2016-03-03 DIAGNOSIS — E1129 Type 2 diabetes mellitus with other diabetic kidney complication: Secondary | ICD-10-CM | POA: Diagnosis not present

## 2016-03-03 DIAGNOSIS — N186 End stage renal disease: Secondary | ICD-10-CM | POA: Diagnosis not present

## 2016-03-05 DIAGNOSIS — N186 End stage renal disease: Secondary | ICD-10-CM | POA: Diagnosis not present

## 2016-03-05 DIAGNOSIS — N2581 Secondary hyperparathyroidism of renal origin: Secondary | ICD-10-CM | POA: Diagnosis not present

## 2016-03-05 DIAGNOSIS — D631 Anemia in chronic kidney disease: Secondary | ICD-10-CM | POA: Diagnosis not present

## 2016-03-05 DIAGNOSIS — E1129 Type 2 diabetes mellitus with other diabetic kidney complication: Secondary | ICD-10-CM | POA: Diagnosis not present

## 2016-03-07 DIAGNOSIS — N186 End stage renal disease: Secondary | ICD-10-CM | POA: Diagnosis not present

## 2016-03-07 DIAGNOSIS — N2581 Secondary hyperparathyroidism of renal origin: Secondary | ICD-10-CM | POA: Diagnosis not present

## 2016-03-07 DIAGNOSIS — E1129 Type 2 diabetes mellitus with other diabetic kidney complication: Secondary | ICD-10-CM | POA: Diagnosis not present

## 2016-03-07 DIAGNOSIS — D631 Anemia in chronic kidney disease: Secondary | ICD-10-CM | POA: Diagnosis not present

## 2016-03-10 DIAGNOSIS — D631 Anemia in chronic kidney disease: Secondary | ICD-10-CM | POA: Diagnosis not present

## 2016-03-10 DIAGNOSIS — E1129 Type 2 diabetes mellitus with other diabetic kidney complication: Secondary | ICD-10-CM | POA: Diagnosis not present

## 2016-03-10 DIAGNOSIS — N2581 Secondary hyperparathyroidism of renal origin: Secondary | ICD-10-CM | POA: Diagnosis not present

## 2016-03-10 DIAGNOSIS — N186 End stage renal disease: Secondary | ICD-10-CM | POA: Diagnosis not present

## 2016-03-12 ENCOUNTER — Other Ambulatory Visit: Payer: Self-pay | Admitting: Internal Medicine

## 2016-03-12 DIAGNOSIS — D631 Anemia in chronic kidney disease: Secondary | ICD-10-CM | POA: Diagnosis not present

## 2016-03-12 DIAGNOSIS — E1142 Type 2 diabetes mellitus with diabetic polyneuropathy: Secondary | ICD-10-CM | POA: Diagnosis not present

## 2016-03-12 DIAGNOSIS — N2581 Secondary hyperparathyroidism of renal origin: Secondary | ICD-10-CM | POA: Diagnosis not present

## 2016-03-12 DIAGNOSIS — N186 End stage renal disease: Secondary | ICD-10-CM | POA: Diagnosis not present

## 2016-03-12 DIAGNOSIS — E1129 Type 2 diabetes mellitus with other diabetic kidney complication: Secondary | ICD-10-CM | POA: Diagnosis not present

## 2016-03-14 DIAGNOSIS — E1129 Type 2 diabetes mellitus with other diabetic kidney complication: Secondary | ICD-10-CM | POA: Diagnosis not present

## 2016-03-14 DIAGNOSIS — D631 Anemia in chronic kidney disease: Secondary | ICD-10-CM | POA: Diagnosis not present

## 2016-03-14 DIAGNOSIS — N2581 Secondary hyperparathyroidism of renal origin: Secondary | ICD-10-CM | POA: Diagnosis not present

## 2016-03-14 DIAGNOSIS — N186 End stage renal disease: Secondary | ICD-10-CM | POA: Diagnosis not present

## 2016-03-17 DIAGNOSIS — D631 Anemia in chronic kidney disease: Secondary | ICD-10-CM | POA: Diagnosis not present

## 2016-03-17 DIAGNOSIS — N2581 Secondary hyperparathyroidism of renal origin: Secondary | ICD-10-CM | POA: Diagnosis not present

## 2016-03-17 DIAGNOSIS — E1129 Type 2 diabetes mellitus with other diabetic kidney complication: Secondary | ICD-10-CM | POA: Diagnosis not present

## 2016-03-17 DIAGNOSIS — N186 End stage renal disease: Secondary | ICD-10-CM | POA: Diagnosis not present

## 2016-03-19 DIAGNOSIS — N2581 Secondary hyperparathyroidism of renal origin: Secondary | ICD-10-CM | POA: Diagnosis not present

## 2016-03-19 DIAGNOSIS — D631 Anemia in chronic kidney disease: Secondary | ICD-10-CM | POA: Diagnosis not present

## 2016-03-19 DIAGNOSIS — E1129 Type 2 diabetes mellitus with other diabetic kidney complication: Secondary | ICD-10-CM | POA: Diagnosis not present

## 2016-03-19 DIAGNOSIS — N186 End stage renal disease: Secondary | ICD-10-CM | POA: Diagnosis not present

## 2016-03-20 DIAGNOSIS — E1129 Type 2 diabetes mellitus with other diabetic kidney complication: Secondary | ICD-10-CM | POA: Diagnosis not present

## 2016-03-20 DIAGNOSIS — Z992 Dependence on renal dialysis: Secondary | ICD-10-CM | POA: Diagnosis not present

## 2016-03-20 DIAGNOSIS — N186 End stage renal disease: Secondary | ICD-10-CM | POA: Diagnosis not present

## 2016-03-21 DIAGNOSIS — N2581 Secondary hyperparathyroidism of renal origin: Secondary | ICD-10-CM | POA: Diagnosis not present

## 2016-03-21 DIAGNOSIS — D631 Anemia in chronic kidney disease: Secondary | ICD-10-CM | POA: Diagnosis not present

## 2016-03-21 DIAGNOSIS — Z23 Encounter for immunization: Secondary | ICD-10-CM | POA: Diagnosis not present

## 2016-03-21 DIAGNOSIS — E1129 Type 2 diabetes mellitus with other diabetic kidney complication: Secondary | ICD-10-CM | POA: Diagnosis not present

## 2016-03-21 DIAGNOSIS — N186 End stage renal disease: Secondary | ICD-10-CM | POA: Diagnosis not present

## 2016-03-24 DIAGNOSIS — E1129 Type 2 diabetes mellitus with other diabetic kidney complication: Secondary | ICD-10-CM | POA: Diagnosis not present

## 2016-03-24 DIAGNOSIS — Z23 Encounter for immunization: Secondary | ICD-10-CM | POA: Diagnosis not present

## 2016-03-24 DIAGNOSIS — N186 End stage renal disease: Secondary | ICD-10-CM | POA: Diagnosis not present

## 2016-03-24 DIAGNOSIS — N2581 Secondary hyperparathyroidism of renal origin: Secondary | ICD-10-CM | POA: Diagnosis not present

## 2016-03-24 DIAGNOSIS — D631 Anemia in chronic kidney disease: Secondary | ICD-10-CM | POA: Diagnosis not present

## 2016-03-26 DIAGNOSIS — Z23 Encounter for immunization: Secondary | ICD-10-CM | POA: Diagnosis not present

## 2016-03-26 DIAGNOSIS — D631 Anemia in chronic kidney disease: Secondary | ICD-10-CM | POA: Diagnosis not present

## 2016-03-26 DIAGNOSIS — N2581 Secondary hyperparathyroidism of renal origin: Secondary | ICD-10-CM | POA: Diagnosis not present

## 2016-03-26 DIAGNOSIS — E1129 Type 2 diabetes mellitus with other diabetic kidney complication: Secondary | ICD-10-CM | POA: Diagnosis not present

## 2016-03-26 DIAGNOSIS — N186 End stage renal disease: Secondary | ICD-10-CM | POA: Diagnosis not present

## 2016-03-28 DIAGNOSIS — N186 End stage renal disease: Secondary | ICD-10-CM | POA: Diagnosis not present

## 2016-03-28 DIAGNOSIS — E1129 Type 2 diabetes mellitus with other diabetic kidney complication: Secondary | ICD-10-CM | POA: Diagnosis not present

## 2016-03-28 DIAGNOSIS — Z23 Encounter for immunization: Secondary | ICD-10-CM | POA: Diagnosis not present

## 2016-03-28 DIAGNOSIS — D631 Anemia in chronic kidney disease: Secondary | ICD-10-CM | POA: Diagnosis not present

## 2016-03-28 DIAGNOSIS — N2581 Secondary hyperparathyroidism of renal origin: Secondary | ICD-10-CM | POA: Diagnosis not present

## 2016-03-31 DIAGNOSIS — N186 End stage renal disease: Secondary | ICD-10-CM | POA: Diagnosis not present

## 2016-03-31 DIAGNOSIS — E1129 Type 2 diabetes mellitus with other diabetic kidney complication: Secondary | ICD-10-CM | POA: Diagnosis not present

## 2016-03-31 DIAGNOSIS — N2581 Secondary hyperparathyroidism of renal origin: Secondary | ICD-10-CM | POA: Diagnosis not present

## 2016-03-31 DIAGNOSIS — Z23 Encounter for immunization: Secondary | ICD-10-CM | POA: Diagnosis not present

## 2016-03-31 DIAGNOSIS — D631 Anemia in chronic kidney disease: Secondary | ICD-10-CM | POA: Diagnosis not present

## 2016-04-02 DIAGNOSIS — E1129 Type 2 diabetes mellitus with other diabetic kidney complication: Secondary | ICD-10-CM | POA: Diagnosis not present

## 2016-04-02 DIAGNOSIS — Z23 Encounter for immunization: Secondary | ICD-10-CM | POA: Diagnosis not present

## 2016-04-02 DIAGNOSIS — D631 Anemia in chronic kidney disease: Secondary | ICD-10-CM | POA: Diagnosis not present

## 2016-04-02 DIAGNOSIS — N186 End stage renal disease: Secondary | ICD-10-CM | POA: Diagnosis not present

## 2016-04-02 DIAGNOSIS — N2581 Secondary hyperparathyroidism of renal origin: Secondary | ICD-10-CM | POA: Diagnosis not present

## 2016-04-04 DIAGNOSIS — N186 End stage renal disease: Secondary | ICD-10-CM | POA: Diagnosis not present

## 2016-04-04 DIAGNOSIS — N2581 Secondary hyperparathyroidism of renal origin: Secondary | ICD-10-CM | POA: Diagnosis not present

## 2016-04-04 DIAGNOSIS — E1129 Type 2 diabetes mellitus with other diabetic kidney complication: Secondary | ICD-10-CM | POA: Diagnosis not present

## 2016-04-04 DIAGNOSIS — Z23 Encounter for immunization: Secondary | ICD-10-CM | POA: Diagnosis not present

## 2016-04-04 DIAGNOSIS — D631 Anemia in chronic kidney disease: Secondary | ICD-10-CM | POA: Diagnosis not present

## 2016-04-07 DIAGNOSIS — D631 Anemia in chronic kidney disease: Secondary | ICD-10-CM | POA: Diagnosis not present

## 2016-04-07 DIAGNOSIS — N186 End stage renal disease: Secondary | ICD-10-CM | POA: Diagnosis not present

## 2016-04-07 DIAGNOSIS — E1129 Type 2 diabetes mellitus with other diabetic kidney complication: Secondary | ICD-10-CM | POA: Diagnosis not present

## 2016-04-07 DIAGNOSIS — Z23 Encounter for immunization: Secondary | ICD-10-CM | POA: Diagnosis not present

## 2016-04-07 DIAGNOSIS — N2581 Secondary hyperparathyroidism of renal origin: Secondary | ICD-10-CM | POA: Diagnosis not present

## 2016-04-08 DIAGNOSIS — Z6834 Body mass index (BMI) 34.0-34.9, adult: Secondary | ICD-10-CM | POA: Diagnosis not present

## 2016-04-08 DIAGNOSIS — I509 Heart failure, unspecified: Secondary | ICD-10-CM | POA: Diagnosis not present

## 2016-04-08 DIAGNOSIS — I10 Essential (primary) hypertension: Secondary | ICD-10-CM | POA: Diagnosis not present

## 2016-04-08 DIAGNOSIS — N184 Chronic kidney disease, stage 4 (severe): Secondary | ICD-10-CM | POA: Diagnosis not present

## 2016-04-08 DIAGNOSIS — E118 Type 2 diabetes mellitus with unspecified complications: Secondary | ICD-10-CM | POA: Diagnosis not present

## 2016-04-08 DIAGNOSIS — G609 Hereditary and idiopathic neuropathy, unspecified: Secondary | ICD-10-CM | POA: Diagnosis not present

## 2016-04-08 DIAGNOSIS — E1129 Type 2 diabetes mellitus with other diabetic kidney complication: Secondary | ICD-10-CM | POA: Diagnosis not present

## 2016-04-09 DIAGNOSIS — D631 Anemia in chronic kidney disease: Secondary | ICD-10-CM | POA: Diagnosis not present

## 2016-04-09 DIAGNOSIS — E1129 Type 2 diabetes mellitus with other diabetic kidney complication: Secondary | ICD-10-CM | POA: Diagnosis not present

## 2016-04-09 DIAGNOSIS — Z23 Encounter for immunization: Secondary | ICD-10-CM | POA: Diagnosis not present

## 2016-04-09 DIAGNOSIS — N2581 Secondary hyperparathyroidism of renal origin: Secondary | ICD-10-CM | POA: Diagnosis not present

## 2016-04-09 DIAGNOSIS — N186 End stage renal disease: Secondary | ICD-10-CM | POA: Diagnosis not present

## 2016-04-11 DIAGNOSIS — Z23 Encounter for immunization: Secondary | ICD-10-CM | POA: Diagnosis not present

## 2016-04-11 DIAGNOSIS — E1129 Type 2 diabetes mellitus with other diabetic kidney complication: Secondary | ICD-10-CM | POA: Diagnosis not present

## 2016-04-11 DIAGNOSIS — N2581 Secondary hyperparathyroidism of renal origin: Secondary | ICD-10-CM | POA: Diagnosis not present

## 2016-04-11 DIAGNOSIS — D631 Anemia in chronic kidney disease: Secondary | ICD-10-CM | POA: Diagnosis not present

## 2016-04-11 DIAGNOSIS — N186 End stage renal disease: Secondary | ICD-10-CM | POA: Diagnosis not present

## 2016-04-14 DIAGNOSIS — Z23 Encounter for immunization: Secondary | ICD-10-CM | POA: Diagnosis not present

## 2016-04-14 DIAGNOSIS — E1129 Type 2 diabetes mellitus with other diabetic kidney complication: Secondary | ICD-10-CM | POA: Diagnosis not present

## 2016-04-14 DIAGNOSIS — N186 End stage renal disease: Secondary | ICD-10-CM | POA: Diagnosis not present

## 2016-04-14 DIAGNOSIS — N2581 Secondary hyperparathyroidism of renal origin: Secondary | ICD-10-CM | POA: Diagnosis not present

## 2016-04-14 DIAGNOSIS — D631 Anemia in chronic kidney disease: Secondary | ICD-10-CM | POA: Diagnosis not present

## 2016-04-16 DIAGNOSIS — N2581 Secondary hyperparathyroidism of renal origin: Secondary | ICD-10-CM | POA: Diagnosis not present

## 2016-04-16 DIAGNOSIS — N186 End stage renal disease: Secondary | ICD-10-CM | POA: Diagnosis not present

## 2016-04-16 DIAGNOSIS — E1129 Type 2 diabetes mellitus with other diabetic kidney complication: Secondary | ICD-10-CM | POA: Diagnosis not present

## 2016-04-16 DIAGNOSIS — Z23 Encounter for immunization: Secondary | ICD-10-CM | POA: Diagnosis not present

## 2016-04-16 DIAGNOSIS — D631 Anemia in chronic kidney disease: Secondary | ICD-10-CM | POA: Diagnosis not present

## 2016-04-18 DIAGNOSIS — D631 Anemia in chronic kidney disease: Secondary | ICD-10-CM | POA: Diagnosis not present

## 2016-04-18 DIAGNOSIS — N2581 Secondary hyperparathyroidism of renal origin: Secondary | ICD-10-CM | POA: Diagnosis not present

## 2016-04-18 DIAGNOSIS — N186 End stage renal disease: Secondary | ICD-10-CM | POA: Diagnosis not present

## 2016-04-18 DIAGNOSIS — E1129 Type 2 diabetes mellitus with other diabetic kidney complication: Secondary | ICD-10-CM | POA: Diagnosis not present

## 2016-04-18 DIAGNOSIS — Z23 Encounter for immunization: Secondary | ICD-10-CM | POA: Diagnosis not present

## 2016-04-19 DIAGNOSIS — Z992 Dependence on renal dialysis: Secondary | ICD-10-CM | POA: Diagnosis not present

## 2016-04-19 DIAGNOSIS — E1129 Type 2 diabetes mellitus with other diabetic kidney complication: Secondary | ICD-10-CM | POA: Diagnosis not present

## 2016-04-19 DIAGNOSIS — N186 End stage renal disease: Secondary | ICD-10-CM | POA: Diagnosis not present

## 2016-04-21 DIAGNOSIS — N2581 Secondary hyperparathyroidism of renal origin: Secondary | ICD-10-CM | POA: Diagnosis not present

## 2016-04-21 DIAGNOSIS — D631 Anemia in chronic kidney disease: Secondary | ICD-10-CM | POA: Diagnosis not present

## 2016-04-21 DIAGNOSIS — N186 End stage renal disease: Secondary | ICD-10-CM | POA: Diagnosis not present

## 2016-04-22 DIAGNOSIS — Z6836 Body mass index (BMI) 36.0-36.9, adult: Secondary | ICD-10-CM | POA: Diagnosis not present

## 2016-04-22 DIAGNOSIS — Z1272 Encounter for screening for malignant neoplasm of vagina: Secondary | ICD-10-CM | POA: Diagnosis not present

## 2016-04-22 DIAGNOSIS — R102 Pelvic and perineal pain: Secondary | ICD-10-CM | POA: Diagnosis not present

## 2016-04-22 DIAGNOSIS — N760 Acute vaginitis: Secondary | ICD-10-CM | POA: Diagnosis not present

## 2016-04-22 DIAGNOSIS — Z124 Encounter for screening for malignant neoplasm of cervix: Secondary | ICD-10-CM | POA: Diagnosis not present

## 2016-04-22 DIAGNOSIS — N390 Urinary tract infection, site not specified: Secondary | ICD-10-CM | POA: Diagnosis not present

## 2016-04-23 ENCOUNTER — Other Ambulatory Visit: Payer: Self-pay | Admitting: Obstetrics & Gynecology

## 2016-04-23 DIAGNOSIS — N186 End stage renal disease: Secondary | ICD-10-CM | POA: Diagnosis not present

## 2016-04-23 DIAGNOSIS — R921 Mammographic calcification found on diagnostic imaging of breast: Secondary | ICD-10-CM

## 2016-04-23 DIAGNOSIS — N2581 Secondary hyperparathyroidism of renal origin: Secondary | ICD-10-CM | POA: Diagnosis not present

## 2016-04-23 DIAGNOSIS — D631 Anemia in chronic kidney disease: Secondary | ICD-10-CM | POA: Diagnosis not present

## 2016-04-25 DIAGNOSIS — N186 End stage renal disease: Secondary | ICD-10-CM | POA: Diagnosis not present

## 2016-04-25 DIAGNOSIS — N2581 Secondary hyperparathyroidism of renal origin: Secondary | ICD-10-CM | POA: Diagnosis not present

## 2016-04-25 DIAGNOSIS — D631 Anemia in chronic kidney disease: Secondary | ICD-10-CM | POA: Diagnosis not present

## 2016-04-28 DIAGNOSIS — N2581 Secondary hyperparathyroidism of renal origin: Secondary | ICD-10-CM | POA: Diagnosis not present

## 2016-04-28 DIAGNOSIS — D631 Anemia in chronic kidney disease: Secondary | ICD-10-CM | POA: Diagnosis not present

## 2016-04-28 DIAGNOSIS — N186 End stage renal disease: Secondary | ICD-10-CM | POA: Diagnosis not present

## 2016-04-29 ENCOUNTER — Ambulatory Visit
Admission: RE | Admit: 2016-04-29 | Discharge: 2016-04-29 | Disposition: A | Discharge status: Home / Self Care | Payer: Medicare Other | Source: Ambulatory Visit | Attending: Obstetrics & Gynecology | Admitting: Obstetrics & Gynecology

## 2016-04-29 ENCOUNTER — Telehealth: Payer: Self-pay | Admitting: *Deleted

## 2016-04-29 DIAGNOSIS — R921 Mammographic calcification found on diagnostic imaging of breast: Secondary | ICD-10-CM

## 2016-04-29 NOTE — Telephone Encounter (Signed)
Per MD, ok to see pt , request for 30 min visit.  Message to scheduling

## 2016-04-29 NOTE — Telephone Encounter (Signed)
Pt called regarding a follow up appt.      Last seen June 2016 by Gentry Fitz, NP.     Pt advised she did not know when needed to come back, she has difficulty remembering at times.    Pt recently seen by GYN for back, hip and vaginal bone pain, difficulty walking and stiffness with moving.   Pt's  GYN advised she is to follow up with MD at [facility].   Pt advised she will have a mammogram today 10/10 at 12pm  Pt has Dialysis on M/W/F, uses [facility] for transportation, pt requesting an appt on Tues/Thursday after lunch.  Will review with MD

## 2016-04-30 DIAGNOSIS — N186 End stage renal disease: Secondary | ICD-10-CM | POA: Diagnosis not present

## 2016-04-30 DIAGNOSIS — D631 Anemia in chronic kidney disease: Secondary | ICD-10-CM | POA: Diagnosis not present

## 2016-04-30 DIAGNOSIS — N2581 Secondary hyperparathyroidism of renal origin: Secondary | ICD-10-CM | POA: Diagnosis not present

## 2016-05-02 DIAGNOSIS — D631 Anemia in chronic kidney disease: Secondary | ICD-10-CM | POA: Diagnosis not present

## 2016-05-02 DIAGNOSIS — N186 End stage renal disease: Secondary | ICD-10-CM | POA: Diagnosis not present

## 2016-05-02 DIAGNOSIS — N2581 Secondary hyperparathyroidism of renal origin: Secondary | ICD-10-CM | POA: Diagnosis not present

## 2016-05-05 DIAGNOSIS — N186 End stage renal disease: Secondary | ICD-10-CM | POA: Diagnosis not present

## 2016-05-05 DIAGNOSIS — N2581 Secondary hyperparathyroidism of renal origin: Secondary | ICD-10-CM | POA: Diagnosis not present

## 2016-05-05 DIAGNOSIS — D631 Anemia in chronic kidney disease: Secondary | ICD-10-CM | POA: Diagnosis not present

## 2016-05-07 DIAGNOSIS — N186 End stage renal disease: Secondary | ICD-10-CM | POA: Diagnosis not present

## 2016-05-07 DIAGNOSIS — D631 Anemia in chronic kidney disease: Secondary | ICD-10-CM | POA: Diagnosis not present

## 2016-05-07 DIAGNOSIS — N2581 Secondary hyperparathyroidism of renal origin: Secondary | ICD-10-CM | POA: Diagnosis not present

## 2016-05-09 DIAGNOSIS — N2581 Secondary hyperparathyroidism of renal origin: Secondary | ICD-10-CM | POA: Diagnosis not present

## 2016-05-09 DIAGNOSIS — N186 End stage renal disease: Secondary | ICD-10-CM | POA: Diagnosis not present

## 2016-05-09 DIAGNOSIS — D631 Anemia in chronic kidney disease: Secondary | ICD-10-CM | POA: Diagnosis not present

## 2016-05-12 DIAGNOSIS — N186 End stage renal disease: Secondary | ICD-10-CM | POA: Diagnosis not present

## 2016-05-12 DIAGNOSIS — D631 Anemia in chronic kidney disease: Secondary | ICD-10-CM | POA: Diagnosis not present

## 2016-05-12 DIAGNOSIS — N2581 Secondary hyperparathyroidism of renal origin: Secondary | ICD-10-CM | POA: Diagnosis not present

## 2016-05-13 ENCOUNTER — Ambulatory Visit: Payer: Medicare Other | Admitting: Hematology and Oncology

## 2016-05-13 NOTE — Assessment & Plan Note (Deleted)
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 Breast exam 05/13/2016: Normal 2. Mammogram 04/29/2016 : Benign dystrophic calcifications posterior left breast, postsurgical changes  History of renal insufficiency History of anemia of chronic kidney disease  Return to clinic in 1 year with survivorship clinic

## 2016-05-14 DIAGNOSIS — E1142 Type 2 diabetes mellitus with diabetic polyneuropathy: Secondary | ICD-10-CM | POA: Diagnosis not present

## 2016-05-14 DIAGNOSIS — D631 Anemia in chronic kidney disease: Secondary | ICD-10-CM | POA: Diagnosis not present

## 2016-05-14 DIAGNOSIS — N2581 Secondary hyperparathyroidism of renal origin: Secondary | ICD-10-CM | POA: Diagnosis not present

## 2016-05-14 DIAGNOSIS — N186 End stage renal disease: Secondary | ICD-10-CM | POA: Diagnosis not present

## 2016-05-16 DIAGNOSIS — N2581 Secondary hyperparathyroidism of renal origin: Secondary | ICD-10-CM | POA: Diagnosis not present

## 2016-05-16 DIAGNOSIS — N186 End stage renal disease: Secondary | ICD-10-CM | POA: Diagnosis not present

## 2016-05-16 DIAGNOSIS — D631 Anemia in chronic kidney disease: Secondary | ICD-10-CM | POA: Diagnosis not present

## 2016-05-19 DIAGNOSIS — N186 End stage renal disease: Secondary | ICD-10-CM | POA: Diagnosis not present

## 2016-05-19 DIAGNOSIS — D631 Anemia in chronic kidney disease: Secondary | ICD-10-CM | POA: Diagnosis not present

## 2016-05-19 DIAGNOSIS — N2581 Secondary hyperparathyroidism of renal origin: Secondary | ICD-10-CM | POA: Diagnosis not present

## 2016-05-20 DIAGNOSIS — N186 End stage renal disease: Secondary | ICD-10-CM | POA: Diagnosis not present

## 2016-05-20 DIAGNOSIS — Z992 Dependence on renal dialysis: Secondary | ICD-10-CM | POA: Diagnosis not present

## 2016-05-20 DIAGNOSIS — E1129 Type 2 diabetes mellitus with other diabetic kidney complication: Secondary | ICD-10-CM | POA: Diagnosis not present

## 2016-05-21 DIAGNOSIS — E1129 Type 2 diabetes mellitus with other diabetic kidney complication: Secondary | ICD-10-CM | POA: Diagnosis not present

## 2016-05-21 DIAGNOSIS — D631 Anemia in chronic kidney disease: Secondary | ICD-10-CM | POA: Diagnosis not present

## 2016-05-21 DIAGNOSIS — N186 End stage renal disease: Secondary | ICD-10-CM | POA: Diagnosis not present

## 2016-05-21 DIAGNOSIS — D509 Iron deficiency anemia, unspecified: Secondary | ICD-10-CM | POA: Diagnosis not present

## 2016-05-21 DIAGNOSIS — N2581 Secondary hyperparathyroidism of renal origin: Secondary | ICD-10-CM | POA: Diagnosis not present

## 2016-05-23 DIAGNOSIS — D631 Anemia in chronic kidney disease: Secondary | ICD-10-CM | POA: Diagnosis not present

## 2016-05-23 DIAGNOSIS — N2581 Secondary hyperparathyroidism of renal origin: Secondary | ICD-10-CM | POA: Diagnosis not present

## 2016-05-23 DIAGNOSIS — E1129 Type 2 diabetes mellitus with other diabetic kidney complication: Secondary | ICD-10-CM | POA: Diagnosis not present

## 2016-05-23 DIAGNOSIS — D509 Iron deficiency anemia, unspecified: Secondary | ICD-10-CM | POA: Diagnosis not present

## 2016-05-23 DIAGNOSIS — N186 End stage renal disease: Secondary | ICD-10-CM | POA: Diagnosis not present

## 2016-05-26 DIAGNOSIS — D509 Iron deficiency anemia, unspecified: Secondary | ICD-10-CM | POA: Diagnosis not present

## 2016-05-26 DIAGNOSIS — N186 End stage renal disease: Secondary | ICD-10-CM | POA: Diagnosis not present

## 2016-05-26 DIAGNOSIS — N2581 Secondary hyperparathyroidism of renal origin: Secondary | ICD-10-CM | POA: Diagnosis not present

## 2016-05-26 DIAGNOSIS — D631 Anemia in chronic kidney disease: Secondary | ICD-10-CM | POA: Diagnosis not present

## 2016-05-26 DIAGNOSIS — E1129 Type 2 diabetes mellitus with other diabetic kidney complication: Secondary | ICD-10-CM | POA: Diagnosis not present

## 2016-05-28 DIAGNOSIS — N2581 Secondary hyperparathyroidism of renal origin: Secondary | ICD-10-CM | POA: Diagnosis not present

## 2016-05-28 DIAGNOSIS — N186 End stage renal disease: Secondary | ICD-10-CM | POA: Diagnosis not present

## 2016-05-28 DIAGNOSIS — D509 Iron deficiency anemia, unspecified: Secondary | ICD-10-CM | POA: Diagnosis not present

## 2016-05-28 DIAGNOSIS — D631 Anemia in chronic kidney disease: Secondary | ICD-10-CM | POA: Diagnosis not present

## 2016-05-28 DIAGNOSIS — E1129 Type 2 diabetes mellitus with other diabetic kidney complication: Secondary | ICD-10-CM | POA: Diagnosis not present

## 2016-05-30 DIAGNOSIS — N186 End stage renal disease: Secondary | ICD-10-CM | POA: Diagnosis not present

## 2016-05-30 DIAGNOSIS — D509 Iron deficiency anemia, unspecified: Secondary | ICD-10-CM | POA: Diagnosis not present

## 2016-05-30 DIAGNOSIS — D631 Anemia in chronic kidney disease: Secondary | ICD-10-CM | POA: Diagnosis not present

## 2016-05-30 DIAGNOSIS — E1129 Type 2 diabetes mellitus with other diabetic kidney complication: Secondary | ICD-10-CM | POA: Diagnosis not present

## 2016-05-30 DIAGNOSIS — N2581 Secondary hyperparathyroidism of renal origin: Secondary | ICD-10-CM | POA: Diagnosis not present

## 2016-06-02 DIAGNOSIS — D509 Iron deficiency anemia, unspecified: Secondary | ICD-10-CM | POA: Diagnosis not present

## 2016-06-02 DIAGNOSIS — N186 End stage renal disease: Secondary | ICD-10-CM | POA: Diagnosis not present

## 2016-06-02 DIAGNOSIS — D631 Anemia in chronic kidney disease: Secondary | ICD-10-CM | POA: Diagnosis not present

## 2016-06-02 DIAGNOSIS — E1129 Type 2 diabetes mellitus with other diabetic kidney complication: Secondary | ICD-10-CM | POA: Diagnosis not present

## 2016-06-02 DIAGNOSIS — N2581 Secondary hyperparathyroidism of renal origin: Secondary | ICD-10-CM | POA: Diagnosis not present

## 2016-06-04 DIAGNOSIS — D631 Anemia in chronic kidney disease: Secondary | ICD-10-CM | POA: Diagnosis not present

## 2016-06-04 DIAGNOSIS — N186 End stage renal disease: Secondary | ICD-10-CM | POA: Diagnosis not present

## 2016-06-04 DIAGNOSIS — E1129 Type 2 diabetes mellitus with other diabetic kidney complication: Secondary | ICD-10-CM | POA: Diagnosis not present

## 2016-06-04 DIAGNOSIS — D509 Iron deficiency anemia, unspecified: Secondary | ICD-10-CM | POA: Diagnosis not present

## 2016-06-04 DIAGNOSIS — N2581 Secondary hyperparathyroidism of renal origin: Secondary | ICD-10-CM | POA: Diagnosis not present

## 2016-06-06 DIAGNOSIS — D509 Iron deficiency anemia, unspecified: Secondary | ICD-10-CM | POA: Diagnosis not present

## 2016-06-06 DIAGNOSIS — N2581 Secondary hyperparathyroidism of renal origin: Secondary | ICD-10-CM | POA: Diagnosis not present

## 2016-06-06 DIAGNOSIS — E1129 Type 2 diabetes mellitus with other diabetic kidney complication: Secondary | ICD-10-CM | POA: Diagnosis not present

## 2016-06-06 DIAGNOSIS — N186 End stage renal disease: Secondary | ICD-10-CM | POA: Diagnosis not present

## 2016-06-06 DIAGNOSIS — D631 Anemia in chronic kidney disease: Secondary | ICD-10-CM | POA: Diagnosis not present

## 2016-06-08 DIAGNOSIS — D509 Iron deficiency anemia, unspecified: Secondary | ICD-10-CM | POA: Diagnosis not present

## 2016-06-08 DIAGNOSIS — E1129 Type 2 diabetes mellitus with other diabetic kidney complication: Secondary | ICD-10-CM | POA: Diagnosis not present

## 2016-06-08 DIAGNOSIS — N2581 Secondary hyperparathyroidism of renal origin: Secondary | ICD-10-CM | POA: Diagnosis not present

## 2016-06-08 DIAGNOSIS — D631 Anemia in chronic kidney disease: Secondary | ICD-10-CM | POA: Diagnosis not present

## 2016-06-08 DIAGNOSIS — N186 End stage renal disease: Secondary | ICD-10-CM | POA: Diagnosis not present

## 2016-06-10 DIAGNOSIS — D631 Anemia in chronic kidney disease: Secondary | ICD-10-CM | POA: Diagnosis not present

## 2016-06-10 DIAGNOSIS — D509 Iron deficiency anemia, unspecified: Secondary | ICD-10-CM | POA: Diagnosis not present

## 2016-06-10 DIAGNOSIS — N186 End stage renal disease: Secondary | ICD-10-CM | POA: Diagnosis not present

## 2016-06-10 DIAGNOSIS — N2581 Secondary hyperparathyroidism of renal origin: Secondary | ICD-10-CM | POA: Diagnosis not present

## 2016-06-10 DIAGNOSIS — E1129 Type 2 diabetes mellitus with other diabetic kidney complication: Secondary | ICD-10-CM | POA: Diagnosis not present

## 2016-06-13 DIAGNOSIS — D509 Iron deficiency anemia, unspecified: Secondary | ICD-10-CM | POA: Diagnosis not present

## 2016-06-13 DIAGNOSIS — D631 Anemia in chronic kidney disease: Secondary | ICD-10-CM | POA: Diagnosis not present

## 2016-06-13 DIAGNOSIS — N186 End stage renal disease: Secondary | ICD-10-CM | POA: Diagnosis not present

## 2016-06-13 DIAGNOSIS — E1129 Type 2 diabetes mellitus with other diabetic kidney complication: Secondary | ICD-10-CM | POA: Diagnosis not present

## 2016-06-13 DIAGNOSIS — N2581 Secondary hyperparathyroidism of renal origin: Secondary | ICD-10-CM | POA: Diagnosis not present

## 2016-06-16 DIAGNOSIS — E1129 Type 2 diabetes mellitus with other diabetic kidney complication: Secondary | ICD-10-CM | POA: Diagnosis not present

## 2016-06-16 DIAGNOSIS — D509 Iron deficiency anemia, unspecified: Secondary | ICD-10-CM | POA: Diagnosis not present

## 2016-06-16 DIAGNOSIS — N2581 Secondary hyperparathyroidism of renal origin: Secondary | ICD-10-CM | POA: Diagnosis not present

## 2016-06-16 DIAGNOSIS — N186 End stage renal disease: Secondary | ICD-10-CM | POA: Diagnosis not present

## 2016-06-16 DIAGNOSIS — D631 Anemia in chronic kidney disease: Secondary | ICD-10-CM | POA: Diagnosis not present

## 2016-06-18 DIAGNOSIS — N2581 Secondary hyperparathyroidism of renal origin: Secondary | ICD-10-CM | POA: Diagnosis not present

## 2016-06-18 DIAGNOSIS — N186 End stage renal disease: Secondary | ICD-10-CM | POA: Diagnosis not present

## 2016-06-18 DIAGNOSIS — E1129 Type 2 diabetes mellitus with other diabetic kidney complication: Secondary | ICD-10-CM | POA: Diagnosis not present

## 2016-06-18 DIAGNOSIS — D509 Iron deficiency anemia, unspecified: Secondary | ICD-10-CM | POA: Diagnosis not present

## 2016-06-18 DIAGNOSIS — D631 Anemia in chronic kidney disease: Secondary | ICD-10-CM | POA: Diagnosis not present

## 2016-06-19 DIAGNOSIS — Z992 Dependence on renal dialysis: Secondary | ICD-10-CM | POA: Diagnosis not present

## 2016-06-19 DIAGNOSIS — N186 End stage renal disease: Secondary | ICD-10-CM | POA: Diagnosis not present

## 2016-06-19 DIAGNOSIS — E1129 Type 2 diabetes mellitus with other diabetic kidney complication: Secondary | ICD-10-CM | POA: Diagnosis not present

## 2016-06-20 DIAGNOSIS — E1129 Type 2 diabetes mellitus with other diabetic kidney complication: Secondary | ICD-10-CM | POA: Diagnosis not present

## 2016-06-20 DIAGNOSIS — N2581 Secondary hyperparathyroidism of renal origin: Secondary | ICD-10-CM | POA: Diagnosis not present

## 2016-06-20 DIAGNOSIS — D631 Anemia in chronic kidney disease: Secondary | ICD-10-CM | POA: Diagnosis not present

## 2016-06-20 DIAGNOSIS — N186 End stage renal disease: Secondary | ICD-10-CM | POA: Diagnosis not present

## 2016-06-23 DIAGNOSIS — N186 End stage renal disease: Secondary | ICD-10-CM | POA: Diagnosis not present

## 2016-06-23 DIAGNOSIS — E1129 Type 2 diabetes mellitus with other diabetic kidney complication: Secondary | ICD-10-CM | POA: Diagnosis not present

## 2016-06-23 DIAGNOSIS — N2581 Secondary hyperparathyroidism of renal origin: Secondary | ICD-10-CM | POA: Diagnosis not present

## 2016-06-23 DIAGNOSIS — D631 Anemia in chronic kidney disease: Secondary | ICD-10-CM | POA: Diagnosis not present

## 2016-06-25 DIAGNOSIS — E1129 Type 2 diabetes mellitus with other diabetic kidney complication: Secondary | ICD-10-CM | POA: Diagnosis not present

## 2016-06-25 DIAGNOSIS — D631 Anemia in chronic kidney disease: Secondary | ICD-10-CM | POA: Diagnosis not present

## 2016-06-25 DIAGNOSIS — N186 End stage renal disease: Secondary | ICD-10-CM | POA: Diagnosis not present

## 2016-06-25 DIAGNOSIS — N2581 Secondary hyperparathyroidism of renal origin: Secondary | ICD-10-CM | POA: Diagnosis not present

## 2016-06-27 DIAGNOSIS — N2581 Secondary hyperparathyroidism of renal origin: Secondary | ICD-10-CM | POA: Diagnosis not present

## 2016-06-27 DIAGNOSIS — N186 End stage renal disease: Secondary | ICD-10-CM | POA: Diagnosis not present

## 2016-06-27 DIAGNOSIS — E1129 Type 2 diabetes mellitus with other diabetic kidney complication: Secondary | ICD-10-CM | POA: Diagnosis not present

## 2016-06-27 DIAGNOSIS — D631 Anemia in chronic kidney disease: Secondary | ICD-10-CM | POA: Diagnosis not present

## 2016-07-01 DIAGNOSIS — N2581 Secondary hyperparathyroidism of renal origin: Secondary | ICD-10-CM | POA: Diagnosis not present

## 2016-07-01 DIAGNOSIS — E1129 Type 2 diabetes mellitus with other diabetic kidney complication: Secondary | ICD-10-CM | POA: Diagnosis not present

## 2016-07-01 DIAGNOSIS — D631 Anemia in chronic kidney disease: Secondary | ICD-10-CM | POA: Diagnosis not present

## 2016-07-01 DIAGNOSIS — N186 End stage renal disease: Secondary | ICD-10-CM | POA: Diagnosis not present

## 2016-07-02 DIAGNOSIS — E1129 Type 2 diabetes mellitus with other diabetic kidney complication: Secondary | ICD-10-CM | POA: Diagnosis not present

## 2016-07-02 DIAGNOSIS — D631 Anemia in chronic kidney disease: Secondary | ICD-10-CM | POA: Diagnosis not present

## 2016-07-02 DIAGNOSIS — N2581 Secondary hyperparathyroidism of renal origin: Secondary | ICD-10-CM | POA: Diagnosis not present

## 2016-07-02 DIAGNOSIS — N186 End stage renal disease: Secondary | ICD-10-CM | POA: Diagnosis not present

## 2016-07-03 DIAGNOSIS — I10 Essential (primary) hypertension: Secondary | ICD-10-CM | POA: Diagnosis not present

## 2016-07-03 DIAGNOSIS — C50912 Malignant neoplasm of unspecified site of left female breast: Secondary | ICD-10-CM | POA: Diagnosis not present

## 2016-07-03 DIAGNOSIS — F39 Unspecified mood [affective] disorder: Secondary | ICD-10-CM | POA: Diagnosis not present

## 2016-07-03 DIAGNOSIS — N186 End stage renal disease: Secondary | ICD-10-CM | POA: Diagnosis not present

## 2016-07-03 DIAGNOSIS — E1129 Type 2 diabetes mellitus with other diabetic kidney complication: Secondary | ICD-10-CM | POA: Diagnosis not present

## 2016-07-03 DIAGNOSIS — R1013 Epigastric pain: Secondary | ICD-10-CM | POA: Diagnosis not present

## 2016-07-03 DIAGNOSIS — Z6835 Body mass index (BMI) 35.0-35.9, adult: Secondary | ICD-10-CM | POA: Diagnosis not present

## 2016-07-03 DIAGNOSIS — H3500 Unspecified background retinopathy: Secondary | ICD-10-CM | POA: Diagnosis not present

## 2016-07-03 DIAGNOSIS — I509 Heart failure, unspecified: Secondary | ICD-10-CM | POA: Diagnosis not present

## 2016-07-04 DIAGNOSIS — D631 Anemia in chronic kidney disease: Secondary | ICD-10-CM | POA: Diagnosis not present

## 2016-07-04 DIAGNOSIS — N186 End stage renal disease: Secondary | ICD-10-CM | POA: Diagnosis not present

## 2016-07-04 DIAGNOSIS — N2581 Secondary hyperparathyroidism of renal origin: Secondary | ICD-10-CM | POA: Diagnosis not present

## 2016-07-04 DIAGNOSIS — E1129 Type 2 diabetes mellitus with other diabetic kidney complication: Secondary | ICD-10-CM | POA: Diagnosis not present

## 2016-07-07 DIAGNOSIS — N2581 Secondary hyperparathyroidism of renal origin: Secondary | ICD-10-CM | POA: Diagnosis not present

## 2016-07-07 DIAGNOSIS — E1129 Type 2 diabetes mellitus with other diabetic kidney complication: Secondary | ICD-10-CM | POA: Diagnosis not present

## 2016-07-07 DIAGNOSIS — N186 End stage renal disease: Secondary | ICD-10-CM | POA: Diagnosis not present

## 2016-07-07 DIAGNOSIS — D631 Anemia in chronic kidney disease: Secondary | ICD-10-CM | POA: Diagnosis not present

## 2016-07-09 DIAGNOSIS — E1129 Type 2 diabetes mellitus with other diabetic kidney complication: Secondary | ICD-10-CM | POA: Diagnosis not present

## 2016-07-09 DIAGNOSIS — D631 Anemia in chronic kidney disease: Secondary | ICD-10-CM | POA: Diagnosis not present

## 2016-07-09 DIAGNOSIS — N2581 Secondary hyperparathyroidism of renal origin: Secondary | ICD-10-CM | POA: Diagnosis not present

## 2016-07-09 DIAGNOSIS — N186 End stage renal disease: Secondary | ICD-10-CM | POA: Diagnosis not present

## 2016-07-11 DIAGNOSIS — D631 Anemia in chronic kidney disease: Secondary | ICD-10-CM | POA: Diagnosis not present

## 2016-07-11 DIAGNOSIS — N2581 Secondary hyperparathyroidism of renal origin: Secondary | ICD-10-CM | POA: Diagnosis not present

## 2016-07-11 DIAGNOSIS — N186 End stage renal disease: Secondary | ICD-10-CM | POA: Diagnosis not present

## 2016-07-11 DIAGNOSIS — E1129 Type 2 diabetes mellitus with other diabetic kidney complication: Secondary | ICD-10-CM | POA: Diagnosis not present

## 2016-07-13 DIAGNOSIS — N2581 Secondary hyperparathyroidism of renal origin: Secondary | ICD-10-CM | POA: Diagnosis not present

## 2016-07-13 DIAGNOSIS — E1129 Type 2 diabetes mellitus with other diabetic kidney complication: Secondary | ICD-10-CM | POA: Diagnosis not present

## 2016-07-13 DIAGNOSIS — N186 End stage renal disease: Secondary | ICD-10-CM | POA: Diagnosis not present

## 2016-07-13 DIAGNOSIS — D631 Anemia in chronic kidney disease: Secondary | ICD-10-CM | POA: Diagnosis not present

## 2016-07-16 DIAGNOSIS — E1129 Type 2 diabetes mellitus with other diabetic kidney complication: Secondary | ICD-10-CM | POA: Diagnosis not present

## 2016-07-16 DIAGNOSIS — N186 End stage renal disease: Secondary | ICD-10-CM | POA: Diagnosis not present

## 2016-07-16 DIAGNOSIS — N2581 Secondary hyperparathyroidism of renal origin: Secondary | ICD-10-CM | POA: Diagnosis not present

## 2016-07-16 DIAGNOSIS — D631 Anemia in chronic kidney disease: Secondary | ICD-10-CM | POA: Diagnosis not present

## 2016-07-18 DIAGNOSIS — E1129 Type 2 diabetes mellitus with other diabetic kidney complication: Secondary | ICD-10-CM | POA: Diagnosis not present

## 2016-07-18 DIAGNOSIS — D631 Anemia in chronic kidney disease: Secondary | ICD-10-CM | POA: Diagnosis not present

## 2016-07-18 DIAGNOSIS — N2581 Secondary hyperparathyroidism of renal origin: Secondary | ICD-10-CM | POA: Diagnosis not present

## 2016-07-18 DIAGNOSIS — N186 End stage renal disease: Secondary | ICD-10-CM | POA: Diagnosis not present

## 2016-07-20 DIAGNOSIS — E1129 Type 2 diabetes mellitus with other diabetic kidney complication: Secondary | ICD-10-CM | POA: Diagnosis not present

## 2016-07-20 DIAGNOSIS — N2581 Secondary hyperparathyroidism of renal origin: Secondary | ICD-10-CM | POA: Diagnosis not present

## 2016-07-20 DIAGNOSIS — N186 End stage renal disease: Secondary | ICD-10-CM | POA: Diagnosis not present

## 2016-07-20 DIAGNOSIS — Z992 Dependence on renal dialysis: Secondary | ICD-10-CM | POA: Diagnosis not present

## 2016-07-20 DIAGNOSIS — D631 Anemia in chronic kidney disease: Secondary | ICD-10-CM | POA: Diagnosis not present

## 2016-07-23 DIAGNOSIS — D631 Anemia in chronic kidney disease: Secondary | ICD-10-CM | POA: Diagnosis not present

## 2016-07-23 DIAGNOSIS — E1129 Type 2 diabetes mellitus with other diabetic kidney complication: Secondary | ICD-10-CM | POA: Diagnosis not present

## 2016-07-23 DIAGNOSIS — D509 Iron deficiency anemia, unspecified: Secondary | ICD-10-CM | POA: Diagnosis not present

## 2016-07-23 DIAGNOSIS — N186 End stage renal disease: Secondary | ICD-10-CM | POA: Diagnosis not present

## 2016-07-23 DIAGNOSIS — N2581 Secondary hyperparathyroidism of renal origin: Secondary | ICD-10-CM | POA: Diagnosis not present

## 2016-07-25 DIAGNOSIS — D509 Iron deficiency anemia, unspecified: Secondary | ICD-10-CM | POA: Diagnosis not present

## 2016-07-25 DIAGNOSIS — D631 Anemia in chronic kidney disease: Secondary | ICD-10-CM | POA: Diagnosis not present

## 2016-07-25 DIAGNOSIS — N2581 Secondary hyperparathyroidism of renal origin: Secondary | ICD-10-CM | POA: Diagnosis not present

## 2016-07-25 DIAGNOSIS — N186 End stage renal disease: Secondary | ICD-10-CM | POA: Diagnosis not present

## 2016-07-25 DIAGNOSIS — E1129 Type 2 diabetes mellitus with other diabetic kidney complication: Secondary | ICD-10-CM | POA: Diagnosis not present

## 2016-07-28 DIAGNOSIS — N2581 Secondary hyperparathyroidism of renal origin: Secondary | ICD-10-CM | POA: Diagnosis not present

## 2016-07-28 DIAGNOSIS — N186 End stage renal disease: Secondary | ICD-10-CM | POA: Diagnosis not present

## 2016-07-28 DIAGNOSIS — E1129 Type 2 diabetes mellitus with other diabetic kidney complication: Secondary | ICD-10-CM | POA: Diagnosis not present

## 2016-07-28 DIAGNOSIS — D509 Iron deficiency anemia, unspecified: Secondary | ICD-10-CM | POA: Diagnosis not present

## 2016-07-28 DIAGNOSIS — D631 Anemia in chronic kidney disease: Secondary | ICD-10-CM | POA: Diagnosis not present

## 2016-07-30 DIAGNOSIS — D631 Anemia in chronic kidney disease: Secondary | ICD-10-CM | POA: Diagnosis not present

## 2016-07-30 DIAGNOSIS — E1129 Type 2 diabetes mellitus with other diabetic kidney complication: Secondary | ICD-10-CM | POA: Diagnosis not present

## 2016-07-30 DIAGNOSIS — D509 Iron deficiency anemia, unspecified: Secondary | ICD-10-CM | POA: Diagnosis not present

## 2016-07-30 DIAGNOSIS — N186 End stage renal disease: Secondary | ICD-10-CM | POA: Diagnosis not present

## 2016-07-30 DIAGNOSIS — N2581 Secondary hyperparathyroidism of renal origin: Secondary | ICD-10-CM | POA: Diagnosis not present

## 2016-07-31 ENCOUNTER — Ambulatory Visit (INDEPENDENT_AMBULATORY_CARE_PROVIDER_SITE_OTHER): Payer: Medicare Other | Admitting: Podiatry

## 2016-07-31 ENCOUNTER — Encounter: Payer: Self-pay | Admitting: Podiatry

## 2016-07-31 VITALS — BP 160/68 | HR 73 | Ht 63.0 in | Wt 213.0 lb

## 2016-07-31 DIAGNOSIS — B351 Tinea unguium: Secondary | ICD-10-CM | POA: Diagnosis not present

## 2016-07-31 DIAGNOSIS — M79671 Pain in right foot: Secondary | ICD-10-CM | POA: Diagnosis not present

## 2016-07-31 DIAGNOSIS — R234 Changes in skin texture: Secondary | ICD-10-CM

## 2016-07-31 DIAGNOSIS — R600 Localized edema: Secondary | ICD-10-CM | POA: Diagnosis not present

## 2016-07-31 DIAGNOSIS — M79672 Pain in left foot: Secondary | ICD-10-CM

## 2016-07-31 DIAGNOSIS — M2010 Hallux valgus (acquired), unspecified foot: Secondary | ICD-10-CM | POA: Diagnosis not present

## 2016-07-31 DIAGNOSIS — M21619 Bunion of unspecified foot: Secondary | ICD-10-CM

## 2016-07-31 NOTE — Progress Notes (Signed)
  SUBJECTIVE: 66 y.o. year old female presents complaining of foot pain.  She was seen at this office over 10 years ago for general foot pain. Patient complaining. Pain in right foot heel and bunion. Also pain from callus under right foot and in her toe nails.  Disability with cancer in breast. Got on Dialysis since cancer treatment, 2014. Diabetic since 1985 and not controlled.   REVIEW OF SYSTEMS: Pertinent items noted in HPI and remainder of comprehensive ROS otherwise negative.  OBJECTIVE: DERMATOLOGIC EXAMINATION: Painful plantar callus under 2nd MPJ right. Thick dystrophic nails x 10.  VASCULAR EXAMINATION OF LOWER LIMBS: All pedal pulses are palpable with normal pulsation.  Severe pitting edema foot and leg bilateral. Temperature gradient from tibial crest to dorsum of foot is within normal bilateral.  NEUROLOGIC EXAMINATION OF THE LOWER LIMBS: Achilles DTR is present and within normal. Monofilament (Semmes-Weinstein 10-gm) sensory testing positive 6 out of 6, bilateral. Vibratory sensations(128Hz  turning fork) intact at medial and lateral forefoot bilateral.  Sharp and Dull discriminatory sensations at the plantar ball of hallux is intact bilateral.   MUSCULOSKELETAL EXAMINATION: Positive for severe hallux valgus with bunion deformity. Flat instep arch both feet.  ASSESSMENT: Painful callus sub 2 right. Severe HAV with bunion deformity right. Onychomycosis x 10. Lower limb edema bilateral.  PLAN: Reviewed findings and available treatment options. Advised to use compression socks during the day. All lesion and nails debrided. Return in 3 months.

## 2016-07-31 NOTE — Patient Instructions (Signed)
Seen for swelling, painful heel, callus under right foot, bunion, and nails. Fissured heel debrided and dressed. All nails and calluses debrided. Need to wear Compression sock during the day to control swelling.  Return in one month.

## 2016-08-01 ENCOUNTER — Telehealth: Payer: Self-pay

## 2016-08-01 DIAGNOSIS — D509 Iron deficiency anemia, unspecified: Secondary | ICD-10-CM | POA: Diagnosis not present

## 2016-08-01 DIAGNOSIS — E1129 Type 2 diabetes mellitus with other diabetic kidney complication: Secondary | ICD-10-CM | POA: Diagnosis not present

## 2016-08-01 DIAGNOSIS — D631 Anemia in chronic kidney disease: Secondary | ICD-10-CM | POA: Diagnosis not present

## 2016-08-01 DIAGNOSIS — N2581 Secondary hyperparathyroidism of renal origin: Secondary | ICD-10-CM | POA: Diagnosis not present

## 2016-08-01 DIAGNOSIS — N186 End stage renal disease: Secondary | ICD-10-CM | POA: Diagnosis not present

## 2016-08-01 NOTE — Telephone Encounter (Signed)
-----   Message from [name], MD sent at 08/01/2016  5:03 PM EST ----- Regarding: Records Please fax my office notes, EGD report, and MR and CT reports to Dr. Dagmar Hait - also note that we spoke to patient about repeating CT but she did not do so -

## 2016-08-01 NOTE — Telephone Encounter (Signed)
Faxed records to Dr Dagmar Hait, see Dr Celesta Aver message.

## 2016-08-04 DIAGNOSIS — N186 End stage renal disease: Secondary | ICD-10-CM | POA: Diagnosis not present

## 2016-08-04 DIAGNOSIS — D509 Iron deficiency anemia, unspecified: Secondary | ICD-10-CM | POA: Diagnosis not present

## 2016-08-04 DIAGNOSIS — D631 Anemia in chronic kidney disease: Secondary | ICD-10-CM | POA: Diagnosis not present

## 2016-08-04 DIAGNOSIS — E1129 Type 2 diabetes mellitus with other diabetic kidney complication: Secondary | ICD-10-CM | POA: Diagnosis not present

## 2016-08-04 DIAGNOSIS — N2581 Secondary hyperparathyroidism of renal origin: Secondary | ICD-10-CM | POA: Diagnosis not present

## 2016-08-06 DIAGNOSIS — D509 Iron deficiency anemia, unspecified: Secondary | ICD-10-CM | POA: Diagnosis not present

## 2016-08-06 DIAGNOSIS — D631 Anemia in chronic kidney disease: Secondary | ICD-10-CM | POA: Diagnosis not present

## 2016-08-06 DIAGNOSIS — N2581 Secondary hyperparathyroidism of renal origin: Secondary | ICD-10-CM | POA: Diagnosis not present

## 2016-08-06 DIAGNOSIS — E1129 Type 2 diabetes mellitus with other diabetic kidney complication: Secondary | ICD-10-CM | POA: Diagnosis not present

## 2016-08-06 DIAGNOSIS — N186 End stage renal disease: Secondary | ICD-10-CM | POA: Diagnosis not present

## 2016-08-08 DIAGNOSIS — N2581 Secondary hyperparathyroidism of renal origin: Secondary | ICD-10-CM | POA: Diagnosis not present

## 2016-08-08 DIAGNOSIS — E1129 Type 2 diabetes mellitus with other diabetic kidney complication: Secondary | ICD-10-CM | POA: Diagnosis not present

## 2016-08-08 DIAGNOSIS — D509 Iron deficiency anemia, unspecified: Secondary | ICD-10-CM | POA: Diagnosis not present

## 2016-08-08 DIAGNOSIS — N186 End stage renal disease: Secondary | ICD-10-CM | POA: Diagnosis not present

## 2016-08-08 DIAGNOSIS — D631 Anemia in chronic kidney disease: Secondary | ICD-10-CM | POA: Diagnosis not present

## 2016-08-11 DIAGNOSIS — D509 Iron deficiency anemia, unspecified: Secondary | ICD-10-CM | POA: Diagnosis not present

## 2016-08-11 DIAGNOSIS — D631 Anemia in chronic kidney disease: Secondary | ICD-10-CM | POA: Diagnosis not present

## 2016-08-11 DIAGNOSIS — N186 End stage renal disease: Secondary | ICD-10-CM | POA: Diagnosis not present

## 2016-08-11 DIAGNOSIS — N2581 Secondary hyperparathyroidism of renal origin: Secondary | ICD-10-CM | POA: Diagnosis not present

## 2016-08-11 DIAGNOSIS — E1129 Type 2 diabetes mellitus with other diabetic kidney complication: Secondary | ICD-10-CM | POA: Diagnosis not present

## 2016-08-12 DIAGNOSIS — I509 Heart failure, unspecified: Secondary | ICD-10-CM | POA: Diagnosis not present

## 2016-08-12 DIAGNOSIS — E1129 Type 2 diabetes mellitus with other diabetic kidney complication: Secondary | ICD-10-CM | POA: Diagnosis not present

## 2016-08-12 DIAGNOSIS — D509 Iron deficiency anemia, unspecified: Secondary | ICD-10-CM | POA: Diagnosis not present

## 2016-08-12 DIAGNOSIS — C50912 Malignant neoplasm of unspecified site of left female breast: Secondary | ICD-10-CM | POA: Diagnosis not present

## 2016-08-12 DIAGNOSIS — G609 Hereditary and idiopathic neuropathy, unspecified: Secondary | ICD-10-CM | POA: Diagnosis not present

## 2016-08-12 DIAGNOSIS — H3500 Unspecified background retinopathy: Secondary | ICD-10-CM | POA: Diagnosis not present

## 2016-08-12 DIAGNOSIS — I10 Essential (primary) hypertension: Secondary | ICD-10-CM | POA: Diagnosis not present

## 2016-08-12 DIAGNOSIS — N186 End stage renal disease: Secondary | ICD-10-CM | POA: Diagnosis not present

## 2016-08-12 DIAGNOSIS — Z6834 Body mass index (BMI) 34.0-34.9, adult: Secondary | ICD-10-CM | POA: Diagnosis not present

## 2016-08-13 DIAGNOSIS — D631 Anemia in chronic kidney disease: Secondary | ICD-10-CM | POA: Diagnosis not present

## 2016-08-13 DIAGNOSIS — E1142 Type 2 diabetes mellitus with diabetic polyneuropathy: Secondary | ICD-10-CM | POA: Diagnosis not present

## 2016-08-13 DIAGNOSIS — N2581 Secondary hyperparathyroidism of renal origin: Secondary | ICD-10-CM | POA: Diagnosis not present

## 2016-08-13 DIAGNOSIS — E1129 Type 2 diabetes mellitus with other diabetic kidney complication: Secondary | ICD-10-CM | POA: Diagnosis not present

## 2016-08-13 DIAGNOSIS — N186 End stage renal disease: Secondary | ICD-10-CM | POA: Diagnosis not present

## 2016-08-13 DIAGNOSIS — D509 Iron deficiency anemia, unspecified: Secondary | ICD-10-CM | POA: Diagnosis not present

## 2016-08-15 DIAGNOSIS — E1129 Type 2 diabetes mellitus with other diabetic kidney complication: Secondary | ICD-10-CM | POA: Diagnosis not present

## 2016-08-15 DIAGNOSIS — D509 Iron deficiency anemia, unspecified: Secondary | ICD-10-CM | POA: Diagnosis not present

## 2016-08-15 DIAGNOSIS — D631 Anemia in chronic kidney disease: Secondary | ICD-10-CM | POA: Diagnosis not present

## 2016-08-15 DIAGNOSIS — N186 End stage renal disease: Secondary | ICD-10-CM | POA: Diagnosis not present

## 2016-08-15 DIAGNOSIS — N2581 Secondary hyperparathyroidism of renal origin: Secondary | ICD-10-CM | POA: Diagnosis not present

## 2016-08-18 DIAGNOSIS — D509 Iron deficiency anemia, unspecified: Secondary | ICD-10-CM | POA: Diagnosis not present

## 2016-08-18 DIAGNOSIS — N2581 Secondary hyperparathyroidism of renal origin: Secondary | ICD-10-CM | POA: Diagnosis not present

## 2016-08-18 DIAGNOSIS — D631 Anemia in chronic kidney disease: Secondary | ICD-10-CM | POA: Diagnosis not present

## 2016-08-18 DIAGNOSIS — N186 End stage renal disease: Secondary | ICD-10-CM | POA: Diagnosis not present

## 2016-08-18 DIAGNOSIS — E1129 Type 2 diabetes mellitus with other diabetic kidney complication: Secondary | ICD-10-CM | POA: Diagnosis not present

## 2016-08-20 DIAGNOSIS — Z992 Dependence on renal dialysis: Secondary | ICD-10-CM | POA: Diagnosis not present

## 2016-08-20 DIAGNOSIS — N186 End stage renal disease: Secondary | ICD-10-CM | POA: Diagnosis not present

## 2016-08-20 DIAGNOSIS — D631 Anemia in chronic kidney disease: Secondary | ICD-10-CM | POA: Diagnosis not present

## 2016-08-20 DIAGNOSIS — E1129 Type 2 diabetes mellitus with other diabetic kidney complication: Secondary | ICD-10-CM | POA: Diagnosis not present

## 2016-08-20 DIAGNOSIS — N2581 Secondary hyperparathyroidism of renal origin: Secondary | ICD-10-CM | POA: Diagnosis not present

## 2016-08-20 DIAGNOSIS — D509 Iron deficiency anemia, unspecified: Secondary | ICD-10-CM | POA: Diagnosis not present

## 2016-08-22 DIAGNOSIS — N186 End stage renal disease: Secondary | ICD-10-CM | POA: Diagnosis not present

## 2016-08-22 DIAGNOSIS — N2581 Secondary hyperparathyroidism of renal origin: Secondary | ICD-10-CM | POA: Diagnosis not present

## 2016-08-22 DIAGNOSIS — D509 Iron deficiency anemia, unspecified: Secondary | ICD-10-CM | POA: Diagnosis not present

## 2016-08-22 DIAGNOSIS — D631 Anemia in chronic kidney disease: Secondary | ICD-10-CM | POA: Diagnosis not present

## 2016-08-22 DIAGNOSIS — E1129 Type 2 diabetes mellitus with other diabetic kidney complication: Secondary | ICD-10-CM | POA: Diagnosis not present

## 2016-08-25 DIAGNOSIS — D509 Iron deficiency anemia, unspecified: Secondary | ICD-10-CM | POA: Diagnosis not present

## 2016-08-25 DIAGNOSIS — D631 Anemia in chronic kidney disease: Secondary | ICD-10-CM | POA: Diagnosis not present

## 2016-08-25 DIAGNOSIS — N2581 Secondary hyperparathyroidism of renal origin: Secondary | ICD-10-CM | POA: Diagnosis not present

## 2016-08-25 DIAGNOSIS — E1129 Type 2 diabetes mellitus with other diabetic kidney complication: Secondary | ICD-10-CM | POA: Diagnosis not present

## 2016-08-25 DIAGNOSIS — N186 End stage renal disease: Secondary | ICD-10-CM | POA: Diagnosis not present

## 2016-08-28 ENCOUNTER — Emergency Department (HOSPITAL_COMMUNITY): Payer: Medicare Other

## 2016-08-28 ENCOUNTER — Emergency Department (HOSPITAL_COMMUNITY)
Admission: EM | Admit: 2016-08-28 | Discharge: 2016-08-28 | Disposition: A | Discharge status: Home / Self Care | Payer: Medicare Other | Attending: Physician Assistant | Admitting: Physician Assistant

## 2016-08-28 ENCOUNTER — Encounter (HOSPITAL_COMMUNITY): Payer: Self-pay | Admitting: Emergency Medicine

## 2016-08-28 DIAGNOSIS — Z794 Long term (current) use of insulin: Secondary | ICD-10-CM | POA: Insufficient documentation

## 2016-08-28 DIAGNOSIS — I132 Hypertensive heart and chronic kidney disease with heart failure and with stage 5 chronic kidney disease, or end stage renal disease: Secondary | ICD-10-CM | POA: Diagnosis not present

## 2016-08-28 DIAGNOSIS — E1129 Type 2 diabetes mellitus with other diabetic kidney complication: Secondary | ICD-10-CM | POA: Diagnosis not present

## 2016-08-28 DIAGNOSIS — R079 Chest pain, unspecified: Secondary | ICD-10-CM | POA: Diagnosis not present

## 2016-08-28 DIAGNOSIS — R509 Fever, unspecified: Secondary | ICD-10-CM | POA: Diagnosis present

## 2016-08-28 DIAGNOSIS — N186 End stage renal disease: Secondary | ICD-10-CM | POA: Insufficient documentation

## 2016-08-28 DIAGNOSIS — Z853 Personal history of malignant neoplasm of breast: Secondary | ICD-10-CM | POA: Diagnosis not present

## 2016-08-28 DIAGNOSIS — D509 Iron deficiency anemia, unspecified: Secondary | ICD-10-CM | POA: Diagnosis not present

## 2016-08-28 DIAGNOSIS — R1011 Right upper quadrant pain: Secondary | ICD-10-CM | POA: Insufficient documentation

## 2016-08-28 DIAGNOSIS — I10 Essential (primary) hypertension: Secondary | ICD-10-CM | POA: Diagnosis not present

## 2016-08-28 DIAGNOSIS — Z992 Dependence on renal dialysis: Secondary | ICD-10-CM | POA: Diagnosis not present

## 2016-08-28 DIAGNOSIS — I509 Heart failure, unspecified: Secondary | ICD-10-CM | POA: Insufficient documentation

## 2016-08-28 DIAGNOSIS — R1031 Right lower quadrant pain: Secondary | ICD-10-CM | POA: Diagnosis not present

## 2016-08-28 DIAGNOSIS — E1122 Type 2 diabetes mellitus with diabetic chronic kidney disease: Secondary | ICD-10-CM | POA: Insufficient documentation

## 2016-08-28 DIAGNOSIS — R52 Pain, unspecified: Secondary | ICD-10-CM

## 2016-08-28 DIAGNOSIS — R0781 Pleurodynia: Secondary | ICD-10-CM | POA: Diagnosis not present

## 2016-08-28 DIAGNOSIS — Z91040 Latex allergy status: Secondary | ICD-10-CM | POA: Diagnosis not present

## 2016-08-28 DIAGNOSIS — R0789 Other chest pain: Secondary | ICD-10-CM | POA: Diagnosis not present

## 2016-08-28 DIAGNOSIS — R0602 Shortness of breath: Secondary | ICD-10-CM | POA: Diagnosis not present

## 2016-08-28 DIAGNOSIS — E1140 Type 2 diabetes mellitus with diabetic neuropathy, unspecified: Secondary | ICD-10-CM | POA: Insufficient documentation

## 2016-08-28 DIAGNOSIS — Z7982 Long term (current) use of aspirin: Secondary | ICD-10-CM | POA: Insufficient documentation

## 2016-08-28 DIAGNOSIS — J111 Influenza due to unidentified influenza virus with other respiratory manifestations: Secondary | ICD-10-CM | POA: Diagnosis not present

## 2016-08-28 DIAGNOSIS — Z6834 Body mass index (BMI) 34.0-34.9, adult: Secondary | ICD-10-CM | POA: Diagnosis not present

## 2016-08-28 DIAGNOSIS — C50912 Malignant neoplasm of unspecified site of left female breast: Secondary | ICD-10-CM | POA: Diagnosis not present

## 2016-08-28 LAB — URINALYSIS, ROUTINE W REFLEX MICROSCOPIC
BACTERIA UA: NONE SEEN
BILIRUBIN URINE: NEGATIVE
Glucose, UA: 150 mg/dL — AB
KETONES UR: NEGATIVE mg/dL
LEUKOCYTES UA: NEGATIVE
NITRITE: NEGATIVE
Protein, ur: 100 mg/dL — AB
Specific Gravity, Urine: 1.018 (ref 1.005–1.030)
pH: 6 (ref 5.0–8.0)

## 2016-08-28 LAB — COMPREHENSIVE METABOLIC PANEL
ALBUMIN: 3.8 g/dL (ref 3.5–5.0)
ALT: 53 U/L (ref 14–54)
ANION GAP: 12 (ref 5–15)
AST: 49 U/L — ABNORMAL HIGH (ref 15–41)
Alkaline Phosphatase: 122 U/L (ref 38–126)
BILIRUBIN TOTAL: 1 mg/dL (ref 0.3–1.2)
BUN: 51 mg/dL — ABNORMAL HIGH (ref 6–20)
CO2: 24 mmol/L (ref 22–32)
Calcium: 9 mg/dL (ref 8.9–10.3)
Chloride: 98 mmol/L — ABNORMAL LOW (ref 101–111)
Creatinine, Ser: 9.43 mg/dL — ABNORMAL HIGH (ref 0.44–1.00)
GFR calc Af Amer: 5 mL/min — ABNORMAL LOW (ref >60)
GFR, EST NON AFRICAN AMERICAN: 4 mL/min — AB (ref >60)
GLUCOSE: 265 mg/dL — AB (ref 65–99)
POTASSIUM: 4.7 mmol/L (ref 3.5–5.1)
Sodium: 134 mmol/L — ABNORMAL LOW (ref 135–145)
TOTAL PROTEIN: 7.1 g/dL (ref 6.5–8.1)

## 2016-08-28 LAB — CBC
HEMATOCRIT: 32.4 % — AB (ref 36.0–46.0)
HEMOGLOBIN: 10.8 g/dL — AB (ref 12.0–15.0)
MCH: 29.8 pg (ref 26.0–34.0)
MCHC: 33.3 g/dL (ref 30.0–36.0)
MCV: 89.5 fL (ref 78.0–100.0)
Platelets: 169 10*3/uL (ref 150–400)
RBC: 3.62 MIL/uL — AB (ref 3.87–5.11)
RDW: 16.6 % — ABNORMAL HIGH (ref 11.5–15.5)
WBC: 5.3 10*3/uL (ref 4.0–10.5)

## 2016-08-28 LAB — LIPASE, BLOOD: Lipase: 33 U/L (ref 11–51)

## 2016-08-28 MED ORDER — BENZONATATE 100 MG PO CAPS: 100.0000 mg | ORAL_CAPSULE | Freq: Three times a day (TID) | ORAL | 0 refills | Status: DC | PRN

## 2016-08-28 MED ORDER — DIAZEPAM 5 MG PO TABS: 2.5000 mg | ORAL_TABLET | Freq: Three times a day (TID) | ORAL | 0 refills | Status: DC | PRN

## 2016-08-28 MED ORDER — IOPAMIDOL (ISOVUE-300) INJECTION 61%
INTRAVENOUS | Status: AC
  Administered 2016-08-28: 30 mL via ORAL
  Filled 2016-08-28: qty 30

## 2016-08-28 MED ORDER — DIAZEPAM 5 MG PO TABS
5.0000 mg | ORAL_TABLET | Freq: Once | ORAL | Status: AC
  Administered 2016-08-28: 5 mg via ORAL
  Filled 2016-08-28: qty 1

## 2016-08-28 MED ORDER — IOPAMIDOL (ISOVUE-300) INJECTION 61%
30.0000 mL | Freq: Once | INTRAVENOUS | Status: AC | PRN
  Administered 2016-08-28: 30 mL via ORAL

## 2016-08-28 NOTE — ED Provider Notes (Signed)
[facility] DEPT Provider Note   CSN: [id number] Arrival date & time: 08/28/16  1338     History   Chief Complaint Chief Complaint  Patient presents with  . Abdominal Pain    HPI [name] is a 66 y.o. female.  HPI   Patient is a 66 year old female presenting with abdominal pain for the last several days. Patient was diagnosed with flu. She is a dialysis patient and was complaining at dialysis that she has pain in her right-sided abdomen when coughing or when twisting to the right or left. Patient does not have pain at rest. She does not have pain with eating. She has not noticed any nausea vomiting or diarrhea. Patient does have cough and a positive flu. She's had no black tarry stool, no vomiting of blood.    Past Medical History:  Diagnosis Date  . Anemia   . Anxiety   . Arthritis    "joints" (06/15/2013)  . Blood transfusion without reported diagnosis   . Breast cancer ([place])    left  . CHF (congestive heart failure) ([place])   . ESRD (end stage renal disease) ([place])    "suppose to start dialysis today" (06/15/2013)  . GERD (gastroesophageal reflux disease)   . Hypertension   . Myalgia 12/31/2011  . Neuropathy ([place]) 12/31/2011  . Shortness of breath    "lately it's been all the time" (06/15/2013)  . Type II diabetes mellitus [facility])     Patient Active Problem List   Diagnosis Date Noted  . Poor venous access 02/08/2015  . End stage renal disease ([place]) 07/08/2013  . Anemia 06/15/2013  . CHF (congestive heart failure) ([place]) 06/15/2013  . Diabetes ([place]) 06/15/2013  . Chronic kidney disease (CKD), stage IV (severe) ([place]) 06/03/2013  . Myalgia 12/31/2011  . Neuropathy ([place]) 12/31/2011  . Breast cancer of upper-inner quadrant of left female breast ([place]) 06/27/2011    Past Surgical History:  Procedure Laterality Date  . ABDOMINAL HYSTERECTOMY     partial  . AV FISTULA PLACEMENT Right 06/06/2013   Procedure: ARTERIOVENOUS (AV) FISTULA CREATION-RIGHT BRACHIAL  CEPHALIC;  Surgeon: Conrad Vadito, MD;  Location: [facility];  Service: Vascular;  Laterality: Right;  . BREAST BIOPSY Left   . BREAST LUMPECTOMY Left    "and took out some lymph nodes" (06/15/2013)  . CATARACT EXTRACTION W/ ANTERIOR VITRECTOMY Bilateral   . CESAREAN SECTION  1980  . EYE SURGERY Bilateral    laser surgery  . REFRACTIVE SURGERY Bilateral   . REMOVAL OF A DIALYSIS CATHETER Right 06/06/2013   Procedure: REMOVAL OF RIGHT MEDIPORT;  Surgeon: Conrad [place], MD;  Location: [facility];  Service: Vascular;  Laterality: Right;  . TONSILLECTOMY      OB History    No data available       Home Medications    Prior to Admission medications   Medication Sig Start Date End Date Taking? Authorizing Provider  acetaminophen (TYLENOL) 500 MG tablet Take 1,000 mg by mouth every 4 (four) hours as needed for mild pain, moderate pain, fever or headache.   Yes Historical Provider, MD  aspirin 81 MG chewable tablet Chew 81 mg by mouth daily.   Yes Historical Provider, MD  carvedilol (COREG) 12.5 MG tablet Take 12.5 mg by mouth 2 (two) times daily with a meal.   Yes Historical Provider, MD  cloNIDine (CATAPRES) 0.1 MG tablet Take 0.1 mg by mouth 2 (two) times daily.   Yes Historical Provider, MD  Docusate Calcium (STOOL  SOFTENER PO) Take 1 capsule by mouth daily.   Yes Historical Provider, MD  fluticasone (FLONASE) 50 MCG/ACT nasal spray Place 1-2 sprays into both nostrils daily.   Yes Historical Provider, MD  insulin aspart (NOVOLOG FLEXPEN) 100 UNIT/ML FlexPen Inject 12-16 Units into the skin 3 (three) times daily with meals. Sliding scale   Yes Historical Provider, MD  Insulin Detemir (LEVEMIR FLEXTOUCH) 100 UNIT/ML Pen Inject 12 Units into the skin daily with lunch.   Yes Historical Provider, MD  lanthanum (FOSRENOL) 1000 MG chewable tablet Chew 2,000 mg by mouth 3 (three) times daily with meals.    Yes Historical Provider, MD  lisinopril (PRINIVIL,ZESTRIL) 40 MG tablet Take 40 mg by mouth at bedtime.     Yes Historical Provider, MD  Multiple Vitamin (MULTIVITAMIN WITH MINERALS) TABS tablet Take 1 tablet by mouth daily.   Yes Historical Provider, MD  omeprazole (PRILOSEC OTC) 20 MG tablet Take 20 mg by mouth daily.   Yes Historical Provider, MD  pantoprazole (PROTONIX) 20 MG tablet Take 1 tablet (20 mg total) by mouth daily. Patient taking differently: Take 20 mg by mouth daily as needed for heartburn.  02/08/15  Yes [name], MD  Phenylephrine-DM-GG (ROBITUSSIN COUGH/COLD CF) 5-10-100 MG/5ML LIQD Take 7.5 mLs by mouth every 4 (four) hours as needed (for cough).   Yes Historical Provider, MD  Phenylephrine-DM-GG-APAP (MUCINEX FAST-MAX COLD FLU) 5-10-200-325 MG/10ML LIQD Take 7.5 mLs by mouth once.   Yes Historical Provider, MD  PROAIR HFA 108 (90 BASE) MCG/ACT inhaler Inhale 1-2 puffs into the lungs every 6 (six) hours as needed for wheezing or shortness of breath.  10/09/14  Yes Historical Provider, MD  simethicone (GAS-X) 80 MG chewable tablet Chew 80 mg by mouth as needed for flatulence.   Yes Historical Provider, MD  benzonatate (TESSALON PERLES) 100 MG capsule Take 1 capsule (100 mg total) by mouth 3 (three) times daily as needed for cough. 08/28/16   Jaidyn Kuhl Lyn Loic Hobin, MD  darbepoetin (ARANESP) 200 MCG/0.4ML SOLN injection Inject 0.4 mLs (200 mcg total) into the vein every Wednesday with hemodialysis. Patient not taking: Reported on 08/28/2016 06/22/13   Geradine Girt, DO  diazepam (VALIUM) 5 MG tablet Take 0.5 tablets (2.5 mg total) by mouth every 8 (eight) hours as needed for muscle spasms. 08/28/16   Dayvin Aber Lyn Bobbette Eakes, MD  doxercalciferol (HECTOROL) 4 MCG/2ML injection Inject 0.5 mLs (1 mcg total) into the vein every Monday, Wednesday, and Friday with hemodialysis. Patient not taking: Reported on 08/28/2016 06/18/13   Geradine Girt, DO  pantoprazole (PROTONIX) 40 MG tablet take 1 tablet by mouth once daily BEFORE BREAKFAST Patient not taking: Reported on 08/28/2016 03/12/16   [name], MD    Family History Family History  Problem Relation Age of Onset  . Diabetes Mother   . Hyperlipidemia Mother   . Hypertension Mother   . Hypertension Father   . Diabetes Sister   . Diabetes Brother   . Hypertension Brother   . Heart attack Brother   . Kidney disease Brother   . Colon cancer Neg Hx   . Colon polyps Neg Hx   . Esophageal cancer Neg Hx   . Gallbladder disease Neg Hx     Social History Social History  Substance Use Topics  . Smoking status: Never Smoker  . Smokeless tobacco: Never Used  . Alcohol use No     Allergies   Oxycodone; Tramadol; and Latex   Review of Systems Review of  Systems  Constitutional: Positive for fatigue and fever. Negative for activity change.  Respiratory: Positive for cough and chest tightness.   Cardiovascular: Negative for chest pain.  Gastrointestinal: Positive for abdominal pain.  Neurological: Positive for weakness.  All other systems reviewed and are negative.    Physical Exam Updated Vital Signs BP 147/66   Pulse 77   Temp 99.9 F (37.7 C) (Oral)   Resp 15   Ht 5\' 4"  (1.626 m)   Wt 211 lb 1.6 oz (95.8 kg)   SpO2 93%   BMI 36.24 kg/m   Physical Exam  Constitutional: She is oriented to person, place, and time. She appears well-developed and well-nourished.  HENT:  Head: Normocephalic and atraumatic.  Eyes: EOM are normal. Right eye exhibits no discharge. Left eye exhibits no discharge.  Cardiovascular: Normal rate, regular rhythm and normal heart sounds.   No murmur heard. Pulmonary/Chest: Effort normal and breath sounds normal. She has no wheezes. She has no rales.  Abdominal: Soft. She exhibits no distension. There is no tenderness.  Patient has no tenderness on exam. Only tenderness when twisting to the right or left.  Musculoskeletal:  Thrill positive  Neurological: She is oriented to person, place, and time.  Skin: Skin is warm and dry. She is not diaphoretic.  Psychiatric: She has a  normal mood and affect.  Nursing note and vitals reviewed.    ED Treatments / Results  Labs (all labs ordered are listed, but only abnormal results are displayed) Labs Reviewed  COMPREHENSIVE METABOLIC PANEL - Abnormal; Notable for the following:       Result Value   Sodium 134 (*)    Chloride 98 (*)    Glucose, Bld 265 (*)    BUN 51 (*)    Creatinine, Ser 9.43 (*)    AST 49 (*)    GFR calc non Af Amer 4 (*)    GFR calc Af Amer 5 (*)    All other components within normal limits  CBC - Abnormal; Notable for the following:    RBC 3.62 (*)    Hemoglobin 10.8 (*)    HCT 32.4 (*)    RDW 16.6 (*)    All other components within normal limits  URINALYSIS, ROUTINE W REFLEX MICROSCOPIC - Abnormal; Notable for the following:    Glucose, UA 150 (*)    Hgb urine dipstick SMALL (*)    Protein, ur 100 (*)    Squamous Epithelial / LPF 0-5 (*)    All other components within normal limits  LIPASE, BLOOD    EKG  EKG Interpretation None       Radiology Ct Abdomen Pelvis Wo Contrast  Result Date: 08/28/2016 CLINICAL DATA:  Patient with right sided and upper abdominal pain. EXAM: CT ABDOMEN AND PELVIS WITHOUT CONTRAST TECHNIQUE: Multidetector CT imaging of the abdomen and pelvis was performed following the standard protocol without IV contrast. COMPARISON:  CT abdomen pelvis 04/05/2015. FINDINGS: Lower chest: Heart is enlarged. No pericardial effusion. Mitral annular calcifications. Mild scarring and/or atelectasis medial right lower lobe. No pleural effusion. Hepatobiliary: Liver is diffusely low in attenuation compatible with steatosis. Re- demonstrated low-attenuation lesion measuring approximately 2.9 cm within the right hepatic lobe (image 40; series 3). Gallbladder is unremarkable. Small amount of perihepatic free fluid. Pancreas: Unremarkable Spleen: Unremarkable Adrenals/Urinary Tract: Adrenal glands are normal. Kidneys are mildly atrophic bilaterally. No hydronephrosis. Urinary bladder  is unremarkable. Stomach/Bowel: No abnormal bowel wall thickening or evidence for bowel obstruction. Vascular/Lymphatic: Peripheral calcified atherosclerotic  plaque. Reproductive: Uterus is surgically absent. Other: Small volume ascites within the pelvis, around the liver and spleen. Diastases of the rectus abdominus. Musculoskeletal: Lower thoracic and lumbar spine degenerative changes. No aggressive or acute appearing osseous lesions. IMPRESSION: Small volume ascites throughout the abdomen.  Anasarca. Hepatic steatosis. Aortic atherosclerosis. Cardiomegaly. Re- demonstrated indeterminate liver lesion, poorly evaluated without IV contrast on current exam. Electronically Signed   By: Lovey Newcomer M.D.   On: 08/28/2016 20:05   Dg Chest 2 View  Result Date: 08/28/2016 CLINICAL DATA:  Left lower chest pain and shortness of breath EXAM: CHEST  2 VIEW COMPARISON:  08/09/2014 FINDINGS: Elevated left diaphragm as before. Moderate to marked cardiomegaly with central vascular congestion. Head no consolidation. No large pleural effusion. No pneumothorax. IMPRESSION: 1. Moderate to marked cardiomegaly with central vascular congestion, this appears slightly increased; this could be secondary to multi chamber enlargement or a pericardial effusion. 2. No focal infiltrate Electronically Signed   By: Donavan Foil M.D.   On: 08/28/2016 19:30    Procedures Procedures (including critical care time)  Medications Ordered in ED Medications  diazepam (VALIUM) tablet 5 mg (5 mg Oral Given 08/28/16 1829)  iopamidol (ISOVUE-300) 61 % injection 30 mL (30 mLs Oral Contrast Given 08/28/16 2012)     Initial Impression / Assessment and Plan / ED Course  I have reviewed the triage vital signs and the nursing notes.  Pertinent labs & imaging results that were available during my care of the patient were reviewed by me and considered in my medical decision making (see chart for details).     Patient is a 66 year old female  end-stage renal disease on dialysis presenting today with right-sided abdominal pain when moving. The abdominal pain is only present on moving or coughing. Patient is a flu positive. Dialysis physician wrote a note that he would like a CAT scan.I think that the patient's pain could be attributed to the flu versus msk pain associated with coughing from the flu. The patient has no abdominal tenderness on exam. Patient is still making urine. Therefore did not want to do a CAT scan with contrast. The CAT scan without contrast will shows that there is any bowel obstruction, or hernia that could be causing the pain. Otherwise patient's labs are reassuring, she is normal vital signs. She is eating and drinking normally with no diarrhea.  CT normal. Patient feels completley back to baseline  She is eating and drinking normally.   Normal vitals.  Will have her go to dialysis in a couple hours (4 am) as planned.   Patient is comfortable, ambulatory, and taking PO at time of discharge.  Patient expressed understanding about return precautions.        Final Clinical Impressions(s) / ED Diagnoses   Final diagnoses:  Pain  Influenza    New Prescriptions Discharge Medication List as of 08/28/2016  9:40 PM    START taking these medications   Details  benzonatate (TESSALON PERLES) 100 MG capsule Take 1 capsule (100 mg total) by mouth 3 (three) times daily as needed for cough., Starting Thu 08/28/2016, Print    diazepam (VALIUM) 5 MG tablet Take 0.5 tablets (2.5 mg total) by mouth every 8 (eight) hours as needed for muscle spasms., Starting Thu 08/28/2016, Print         Christle Nolting Julio Alm, MD 08/28/16 2329

## 2016-08-28 NOTE — ED Notes (Signed)
Drinking contrast now

## 2016-08-28 NOTE — Discharge Instructions (Signed)
You were seen today with abdominal pain that was worse when you  moved. Wyour CT was normal. Please take the Valium as prescribed to help her relax your muscles. It worked well for you here. Please return if you have any trouble keeping hydrated home or eating or any concerns.

## 2016-08-28 NOTE — ED Notes (Signed)
Notified patient urine sample Is needed and handed patient a cup.

## 2016-08-28 NOTE — ED Triage Notes (Signed)
Pt from home with complaints of right sided lower and upper abdominal pain that began on Tuesday. Pt also reports pain when she urinates. Pt reports nausea with 1 episode of emesis on Tuesday. Pt denies diarrhea. Pt states she missed her dialysis appointment yesterday due to abdominal pain. Pt states she took tylenol yesterday for pain which was unsuccessful in relieving her pain.

## 2016-08-29 DIAGNOSIS — D631 Anemia in chronic kidney disease: Secondary | ICD-10-CM | POA: Diagnosis not present

## 2016-08-29 DIAGNOSIS — N186 End stage renal disease: Secondary | ICD-10-CM | POA: Diagnosis not present

## 2016-08-29 DIAGNOSIS — D509 Iron deficiency anemia, unspecified: Secondary | ICD-10-CM | POA: Diagnosis not present

## 2016-08-29 DIAGNOSIS — N2581 Secondary hyperparathyroidism of renal origin: Secondary | ICD-10-CM | POA: Diagnosis not present

## 2016-08-29 DIAGNOSIS — E1129 Type 2 diabetes mellitus with other diabetic kidney complication: Secondary | ICD-10-CM | POA: Diagnosis not present

## 2016-09-01 DIAGNOSIS — N186 End stage renal disease: Secondary | ICD-10-CM | POA: Diagnosis not present

## 2016-09-01 DIAGNOSIS — D631 Anemia in chronic kidney disease: Secondary | ICD-10-CM | POA: Diagnosis not present

## 2016-09-01 DIAGNOSIS — N2581 Secondary hyperparathyroidism of renal origin: Secondary | ICD-10-CM | POA: Diagnosis not present

## 2016-09-01 DIAGNOSIS — D509 Iron deficiency anemia, unspecified: Secondary | ICD-10-CM | POA: Diagnosis not present

## 2016-09-01 DIAGNOSIS — E1129 Type 2 diabetes mellitus with other diabetic kidney complication: Secondary | ICD-10-CM | POA: Diagnosis not present

## 2016-09-02 ENCOUNTER — Ambulatory Visit: Payer: Medicare Other | Admitting: Podiatry

## 2016-09-03 DIAGNOSIS — N2581 Secondary hyperparathyroidism of renal origin: Secondary | ICD-10-CM | POA: Diagnosis not present

## 2016-09-03 DIAGNOSIS — N186 End stage renal disease: Secondary | ICD-10-CM | POA: Diagnosis not present

## 2016-09-03 DIAGNOSIS — D509 Iron deficiency anemia, unspecified: Secondary | ICD-10-CM | POA: Diagnosis not present

## 2016-09-03 DIAGNOSIS — D631 Anemia in chronic kidney disease: Secondary | ICD-10-CM | POA: Diagnosis not present

## 2016-09-03 DIAGNOSIS — E1129 Type 2 diabetes mellitus with other diabetic kidney complication: Secondary | ICD-10-CM | POA: Diagnosis not present

## 2016-09-05 DIAGNOSIS — D631 Anemia in chronic kidney disease: Secondary | ICD-10-CM | POA: Diagnosis not present

## 2016-09-05 DIAGNOSIS — D509 Iron deficiency anemia, unspecified: Secondary | ICD-10-CM | POA: Diagnosis not present

## 2016-09-05 DIAGNOSIS — N186 End stage renal disease: Secondary | ICD-10-CM | POA: Diagnosis not present

## 2016-09-05 DIAGNOSIS — E1129 Type 2 diabetes mellitus with other diabetic kidney complication: Secondary | ICD-10-CM | POA: Diagnosis not present

## 2016-09-05 DIAGNOSIS — N2581 Secondary hyperparathyroidism of renal origin: Secondary | ICD-10-CM | POA: Diagnosis not present

## 2016-09-08 DIAGNOSIS — E1129 Type 2 diabetes mellitus with other diabetic kidney complication: Secondary | ICD-10-CM | POA: Diagnosis not present

## 2016-09-08 DIAGNOSIS — N2581 Secondary hyperparathyroidism of renal origin: Secondary | ICD-10-CM | POA: Diagnosis not present

## 2016-09-08 DIAGNOSIS — N186 End stage renal disease: Secondary | ICD-10-CM | POA: Diagnosis not present

## 2016-09-08 DIAGNOSIS — D509 Iron deficiency anemia, unspecified: Secondary | ICD-10-CM | POA: Diagnosis not present

## 2016-09-08 DIAGNOSIS — D631 Anemia in chronic kidney disease: Secondary | ICD-10-CM | POA: Diagnosis not present

## 2016-09-09 DIAGNOSIS — H353212 Exudative age-related macular degeneration, right eye, with inactive choroidal neovascularization: Secondary | ICD-10-CM | POA: Diagnosis not present

## 2016-09-09 DIAGNOSIS — H353113 Nonexudative age-related macular degeneration, right eye, advanced atrophic without subfoveal involvement: Secondary | ICD-10-CM | POA: Diagnosis not present

## 2016-09-09 DIAGNOSIS — H35361 Drusen (degenerative) of macula, right eye: Secondary | ICD-10-CM | POA: Diagnosis not present

## 2016-09-09 DIAGNOSIS — E113551 Type 2 diabetes mellitus with stable proliferative diabetic retinopathy, right eye: Secondary | ICD-10-CM | POA: Diagnosis not present

## 2016-09-10 DIAGNOSIS — N186 End stage renal disease: Secondary | ICD-10-CM | POA: Diagnosis not present

## 2016-09-10 DIAGNOSIS — E1129 Type 2 diabetes mellitus with other diabetic kidney complication: Secondary | ICD-10-CM | POA: Diagnosis not present

## 2016-09-10 DIAGNOSIS — D509 Iron deficiency anemia, unspecified: Secondary | ICD-10-CM | POA: Diagnosis not present

## 2016-09-10 DIAGNOSIS — N2581 Secondary hyperparathyroidism of renal origin: Secondary | ICD-10-CM | POA: Diagnosis not present

## 2016-09-10 DIAGNOSIS — D631 Anemia in chronic kidney disease: Secondary | ICD-10-CM | POA: Diagnosis not present

## 2016-09-11 ENCOUNTER — Ambulatory Visit (INDEPENDENT_AMBULATORY_CARE_PROVIDER_SITE_OTHER): Payer: Medicare Other | Admitting: Podiatry

## 2016-09-11 ENCOUNTER — Encounter: Payer: Self-pay | Admitting: Podiatry

## 2016-09-11 VITALS — BP 180/60 | HR 85

## 2016-09-11 DIAGNOSIS — Q828 Other specified congenital malformations of skin: Secondary | ICD-10-CM | POA: Diagnosis not present

## 2016-09-11 DIAGNOSIS — B351 Tinea unguium: Secondary | ICD-10-CM | POA: Diagnosis not present

## 2016-09-11 DIAGNOSIS — M79671 Pain in right foot: Secondary | ICD-10-CM | POA: Diagnosis not present

## 2016-09-11 DIAGNOSIS — M79672 Pain in left foot: Secondary | ICD-10-CM | POA: Diagnosis not present

## 2016-09-11 DIAGNOSIS — M21619 Bunion of unspecified foot: Secondary | ICD-10-CM

## 2016-09-11 IMAGING — CR DG CHEST 2V
2 series · 2 of 2 positions shown · non-contrast
Comparison: 07/28/2014.

CLINICAL DATA: Cough, SOB, epigastric pain and nausea.Hx left
breast cancer, diabetes

Pt was in hospital last week for fluid in lungs
EXAM:
CHEST  2 VIEW

[chest pa]
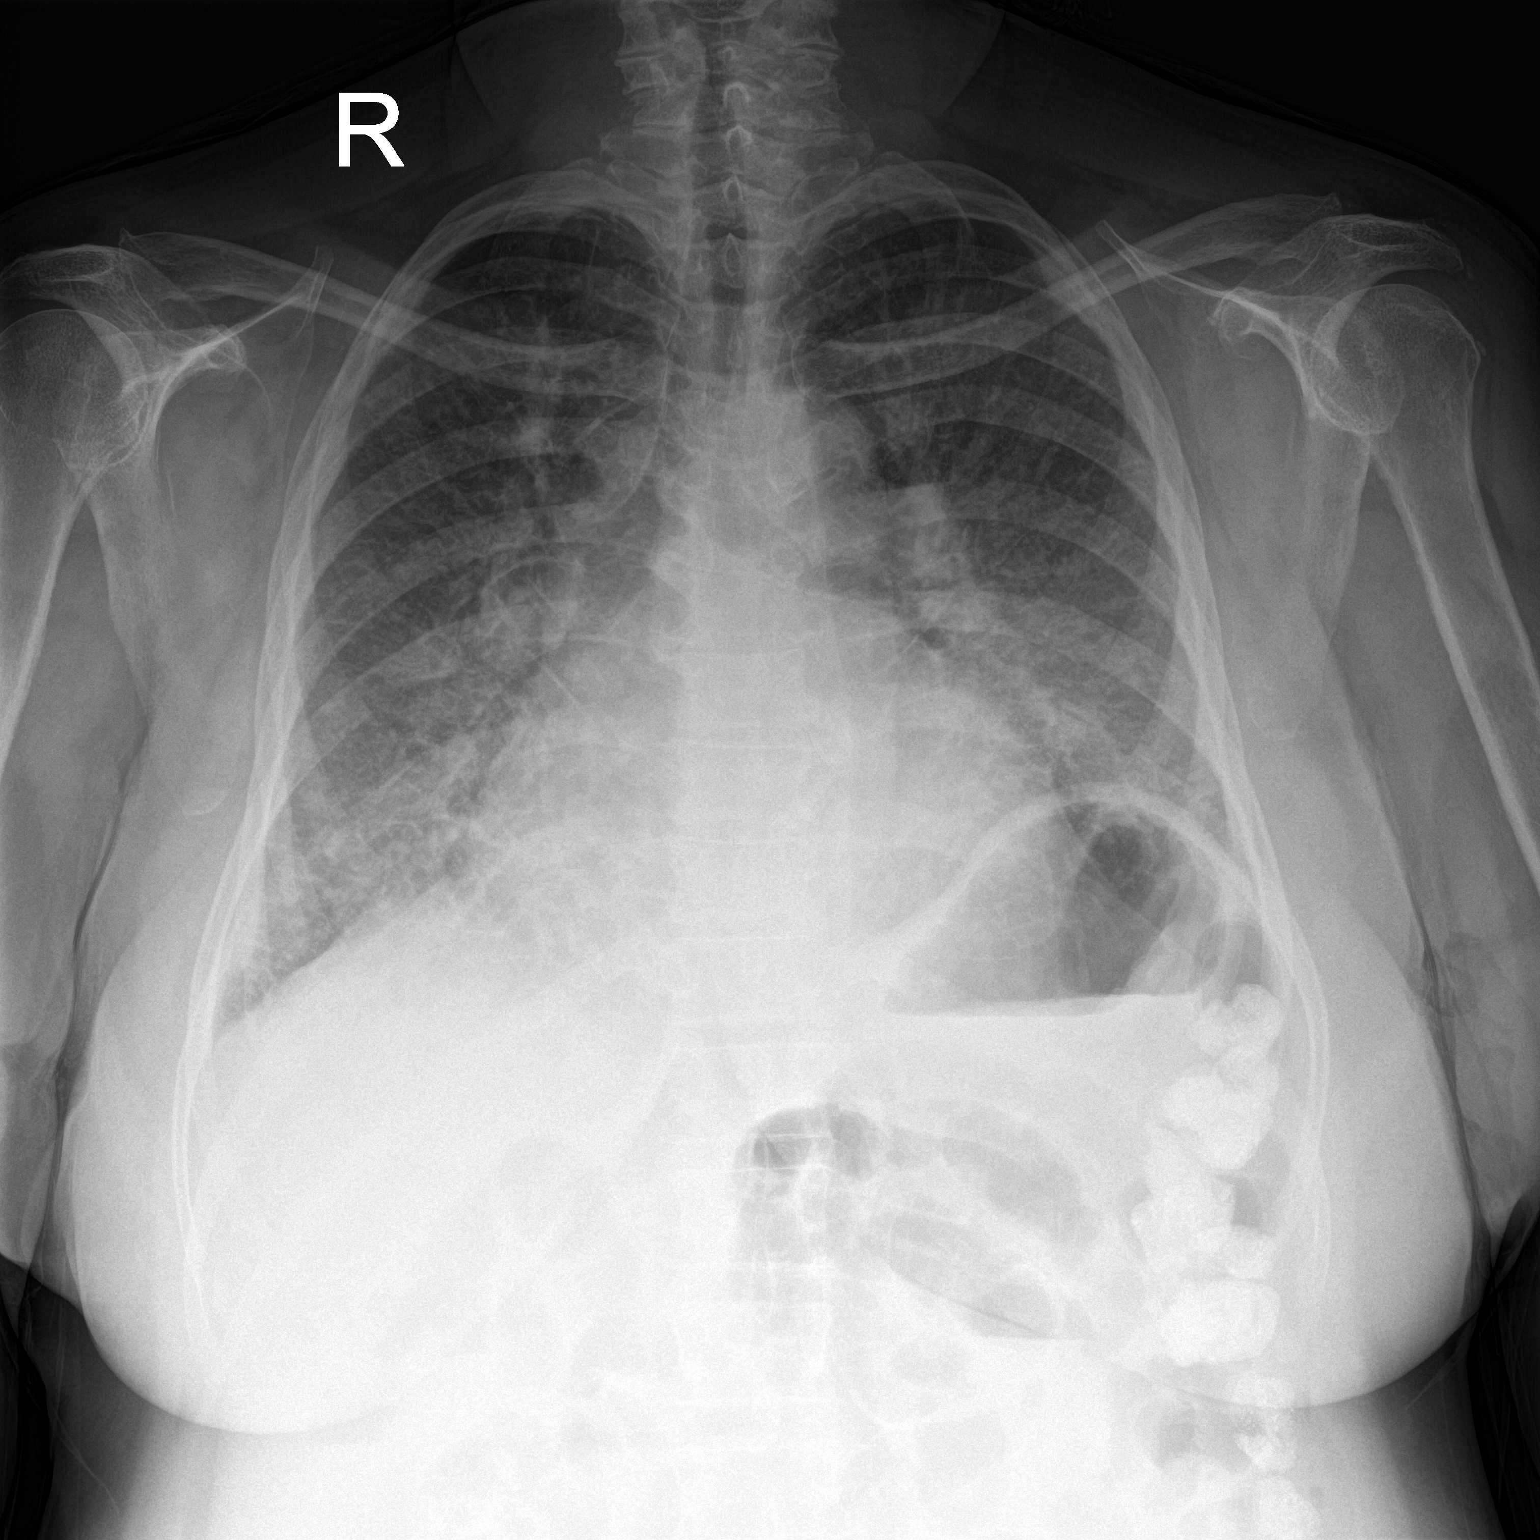

[chest lat]
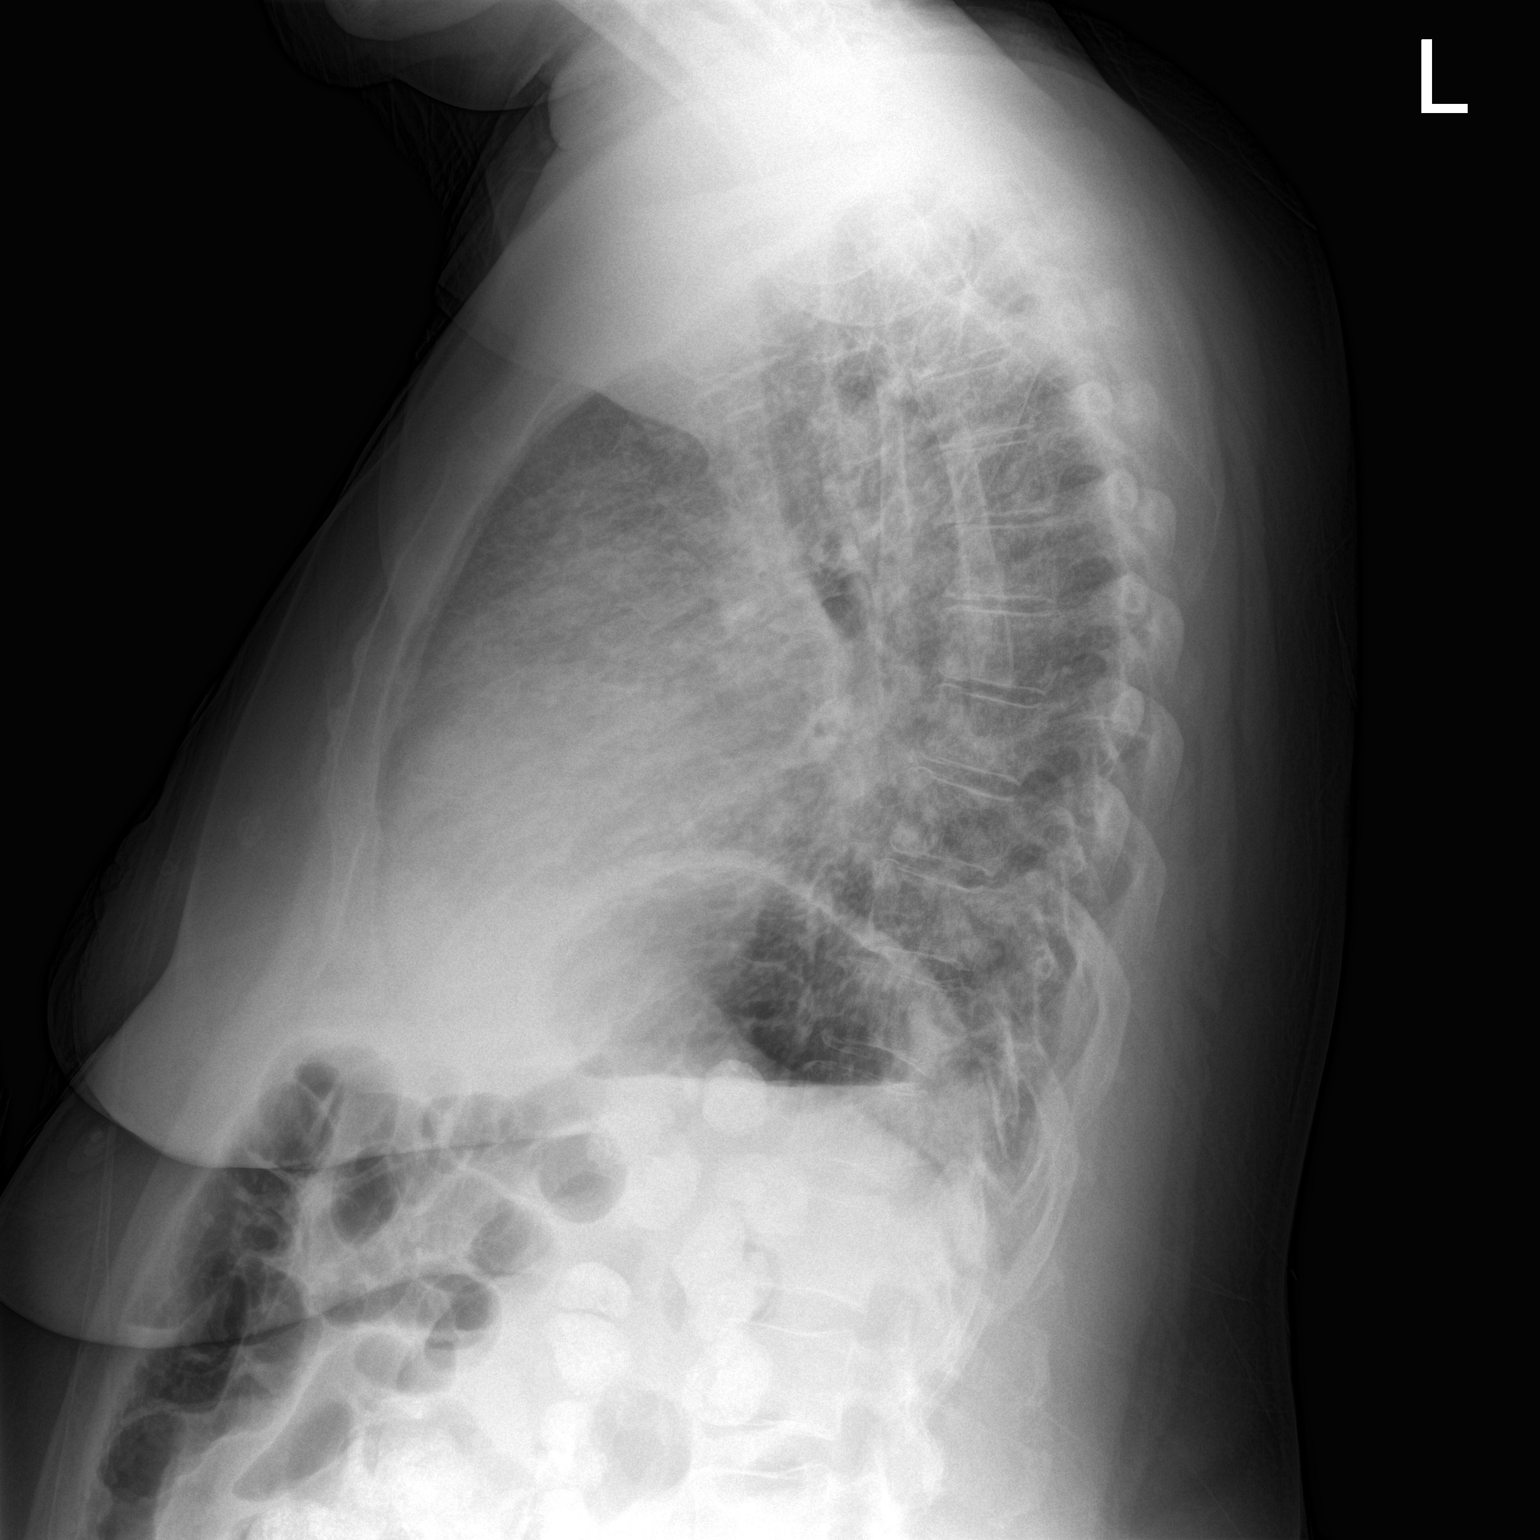

[2 of 2 positions shown; findings below may reference images not displayed]

FINDINGS: Similar to the prior study there is interstitial thickening hand
mild cardiomegaly, findings consistent with congestive heart failure
and interstitial edema. No lung consolidation to suggest pneumonia.
No pleural effusion. No mediastinal or hilar masses. Bony thorax is
demineralized.
IMPRESSION: Findings consistent with CHF interstitial edema similar to the prior
exam.

## 2016-09-11 NOTE — Patient Instructions (Signed)
Seen for hypertrophic nails and plantar lesion right foot. All nails debrided. Plantar callus under 2nd MPJ right foot debrided. Blood sugar is still at around 140 and need be go under 130 to discuss possible bunion surgery.  Return in 3 months or as needed.

## 2016-09-11 NOTE — Progress Notes (Signed)
SUBJECTIVE: 66 y.o. year old female presents complaining of foot pain.  Need to check on bottom of feet and toe nails trimmed. Also bunions hurt. Her blood sugar runs at about 140.   Disability with cancer in breast. Got on Dialysis since cancer treatment, 2014. Diabetic since 1985 and not controlled.   OBJECTIVE: DERMATOLOGIC EXAMINATION: Painful plantar callus under 2nd MPJ right. Thick dystrophic nails x 10.  VASCULAR EXAMINATION OF LOWER LIMBS: All pedal pulses are palpable with normal pulsation.  Temperature gradient from tibial crest to dorsum of foot is within normal bilateral.  NEUROLOGIC EXAMINATION OF THE LOWER LIMBS: All epicritic and tactile sensations grossly intact.  MUSCULOSKELETAL EXAMINATION: Positive for severe hallux valgus with bunion deformity. Flat instep arch both feet.  ASSESSMENT: Painful callus sub 2 right. Severe HAV with bunion deformity right. Onychomycosis x 10. Diabetic not controlled.  PLAN: Reviewed findings and available treatment options. All lesion and nails debrided. Return in 3 months.

## 2016-09-12 DIAGNOSIS — E1129 Type 2 diabetes mellitus with other diabetic kidney complication: Secondary | ICD-10-CM | POA: Diagnosis not present

## 2016-09-12 DIAGNOSIS — N2581 Secondary hyperparathyroidism of renal origin: Secondary | ICD-10-CM | POA: Diagnosis not present

## 2016-09-12 DIAGNOSIS — D631 Anemia in chronic kidney disease: Secondary | ICD-10-CM | POA: Diagnosis not present

## 2016-09-12 DIAGNOSIS — D509 Iron deficiency anemia, unspecified: Secondary | ICD-10-CM | POA: Diagnosis not present

## 2016-09-12 DIAGNOSIS — N186 End stage renal disease: Secondary | ICD-10-CM | POA: Diagnosis not present

## 2016-09-15 DIAGNOSIS — N186 End stage renal disease: Secondary | ICD-10-CM | POA: Diagnosis not present

## 2016-09-15 DIAGNOSIS — D631 Anemia in chronic kidney disease: Secondary | ICD-10-CM | POA: Diagnosis not present

## 2016-09-15 DIAGNOSIS — N2581 Secondary hyperparathyroidism of renal origin: Secondary | ICD-10-CM | POA: Diagnosis not present

## 2016-09-15 DIAGNOSIS — D509 Iron deficiency anemia, unspecified: Secondary | ICD-10-CM | POA: Diagnosis not present

## 2016-09-15 DIAGNOSIS — E1129 Type 2 diabetes mellitus with other diabetic kidney complication: Secondary | ICD-10-CM | POA: Diagnosis not present

## 2016-09-17 DIAGNOSIS — D631 Anemia in chronic kidney disease: Secondary | ICD-10-CM | POA: Diagnosis not present

## 2016-09-17 DIAGNOSIS — N186 End stage renal disease: Secondary | ICD-10-CM | POA: Diagnosis not present

## 2016-09-17 DIAGNOSIS — Z992 Dependence on renal dialysis: Secondary | ICD-10-CM | POA: Diagnosis not present

## 2016-09-17 DIAGNOSIS — E1129 Type 2 diabetes mellitus with other diabetic kidney complication: Secondary | ICD-10-CM | POA: Diagnosis not present

## 2016-09-17 DIAGNOSIS — D509 Iron deficiency anemia, unspecified: Secondary | ICD-10-CM | POA: Diagnosis not present

## 2016-09-17 DIAGNOSIS — N2581 Secondary hyperparathyroidism of renal origin: Secondary | ICD-10-CM | POA: Diagnosis not present

## 2016-09-19 DIAGNOSIS — D509 Iron deficiency anemia, unspecified: Secondary | ICD-10-CM | POA: Diagnosis not present

## 2016-09-19 DIAGNOSIS — D631 Anemia in chronic kidney disease: Secondary | ICD-10-CM | POA: Diagnosis not present

## 2016-09-19 DIAGNOSIS — E1129 Type 2 diabetes mellitus with other diabetic kidney complication: Secondary | ICD-10-CM | POA: Diagnosis not present

## 2016-09-19 DIAGNOSIS — N2581 Secondary hyperparathyroidism of renal origin: Secondary | ICD-10-CM | POA: Diagnosis not present

## 2016-09-19 DIAGNOSIS — N186 End stage renal disease: Secondary | ICD-10-CM | POA: Diagnosis not present

## 2016-09-22 DIAGNOSIS — D631 Anemia in chronic kidney disease: Secondary | ICD-10-CM | POA: Diagnosis not present

## 2016-09-22 DIAGNOSIS — E1129 Type 2 diabetes mellitus with other diabetic kidney complication: Secondary | ICD-10-CM | POA: Diagnosis not present

## 2016-09-22 DIAGNOSIS — N186 End stage renal disease: Secondary | ICD-10-CM | POA: Diagnosis not present

## 2016-09-22 DIAGNOSIS — D509 Iron deficiency anemia, unspecified: Secondary | ICD-10-CM | POA: Diagnosis not present

## 2016-09-22 DIAGNOSIS — N2581 Secondary hyperparathyroidism of renal origin: Secondary | ICD-10-CM | POA: Diagnosis not present

## 2016-09-24 DIAGNOSIS — D509 Iron deficiency anemia, unspecified: Secondary | ICD-10-CM | POA: Diagnosis not present

## 2016-09-24 DIAGNOSIS — N2581 Secondary hyperparathyroidism of renal origin: Secondary | ICD-10-CM | POA: Diagnosis not present

## 2016-09-24 DIAGNOSIS — E1129 Type 2 diabetes mellitus with other diabetic kidney complication: Secondary | ICD-10-CM | POA: Diagnosis not present

## 2016-09-24 DIAGNOSIS — D631 Anemia in chronic kidney disease: Secondary | ICD-10-CM | POA: Diagnosis not present

## 2016-09-24 DIAGNOSIS — N186 End stage renal disease: Secondary | ICD-10-CM | POA: Diagnosis not present

## 2016-09-26 DIAGNOSIS — N186 End stage renal disease: Secondary | ICD-10-CM | POA: Diagnosis not present

## 2016-09-26 DIAGNOSIS — D631 Anemia in chronic kidney disease: Secondary | ICD-10-CM | POA: Diagnosis not present

## 2016-09-26 DIAGNOSIS — N2581 Secondary hyperparathyroidism of renal origin: Secondary | ICD-10-CM | POA: Diagnosis not present

## 2016-09-26 DIAGNOSIS — E1129 Type 2 diabetes mellitus with other diabetic kidney complication: Secondary | ICD-10-CM | POA: Diagnosis not present

## 2016-09-26 DIAGNOSIS — D509 Iron deficiency anemia, unspecified: Secondary | ICD-10-CM | POA: Diagnosis not present

## 2016-09-29 DIAGNOSIS — N2581 Secondary hyperparathyroidism of renal origin: Secondary | ICD-10-CM | POA: Diagnosis not present

## 2016-09-29 DIAGNOSIS — E1129 Type 2 diabetes mellitus with other diabetic kidney complication: Secondary | ICD-10-CM | POA: Diagnosis not present

## 2016-09-29 DIAGNOSIS — N186 End stage renal disease: Secondary | ICD-10-CM | POA: Diagnosis not present

## 2016-09-29 DIAGNOSIS — D631 Anemia in chronic kidney disease: Secondary | ICD-10-CM | POA: Diagnosis not present

## 2016-09-29 DIAGNOSIS — D509 Iron deficiency anemia, unspecified: Secondary | ICD-10-CM | POA: Diagnosis not present

## 2016-10-01 DIAGNOSIS — E1129 Type 2 diabetes mellitus with other diabetic kidney complication: Secondary | ICD-10-CM | POA: Diagnosis not present

## 2016-10-01 DIAGNOSIS — D509 Iron deficiency anemia, unspecified: Secondary | ICD-10-CM | POA: Diagnosis not present

## 2016-10-01 DIAGNOSIS — D631 Anemia in chronic kidney disease: Secondary | ICD-10-CM | POA: Diagnosis not present

## 2016-10-01 DIAGNOSIS — N2581 Secondary hyperparathyroidism of renal origin: Secondary | ICD-10-CM | POA: Diagnosis not present

## 2016-10-01 DIAGNOSIS — N186 End stage renal disease: Secondary | ICD-10-CM | POA: Diagnosis not present

## 2016-10-03 DIAGNOSIS — D509 Iron deficiency anemia, unspecified: Secondary | ICD-10-CM | POA: Diagnosis not present

## 2016-10-03 DIAGNOSIS — D631 Anemia in chronic kidney disease: Secondary | ICD-10-CM | POA: Diagnosis not present

## 2016-10-03 DIAGNOSIS — N186 End stage renal disease: Secondary | ICD-10-CM | POA: Diagnosis not present

## 2016-10-03 DIAGNOSIS — N2581 Secondary hyperparathyroidism of renal origin: Secondary | ICD-10-CM | POA: Diagnosis not present

## 2016-10-03 DIAGNOSIS — E1129 Type 2 diabetes mellitus with other diabetic kidney complication: Secondary | ICD-10-CM | POA: Diagnosis not present

## 2016-10-06 DIAGNOSIS — D509 Iron deficiency anemia, unspecified: Secondary | ICD-10-CM | POA: Diagnosis not present

## 2016-10-06 DIAGNOSIS — E1129 Type 2 diabetes mellitus with other diabetic kidney complication: Secondary | ICD-10-CM | POA: Diagnosis not present

## 2016-10-06 DIAGNOSIS — N186 End stage renal disease: Secondary | ICD-10-CM | POA: Diagnosis not present

## 2016-10-06 DIAGNOSIS — N2581 Secondary hyperparathyroidism of renal origin: Secondary | ICD-10-CM | POA: Diagnosis not present

## 2016-10-06 DIAGNOSIS — D631 Anemia in chronic kidney disease: Secondary | ICD-10-CM | POA: Diagnosis not present

## 2016-10-08 DIAGNOSIS — D509 Iron deficiency anemia, unspecified: Secondary | ICD-10-CM | POA: Diagnosis not present

## 2016-10-08 DIAGNOSIS — N186 End stage renal disease: Secondary | ICD-10-CM | POA: Diagnosis not present

## 2016-10-08 DIAGNOSIS — D631 Anemia in chronic kidney disease: Secondary | ICD-10-CM | POA: Diagnosis not present

## 2016-10-08 DIAGNOSIS — E1129 Type 2 diabetes mellitus with other diabetic kidney complication: Secondary | ICD-10-CM | POA: Diagnosis not present

## 2016-10-08 DIAGNOSIS — N2581 Secondary hyperparathyroidism of renal origin: Secondary | ICD-10-CM | POA: Diagnosis not present

## 2016-10-10 DIAGNOSIS — N186 End stage renal disease: Secondary | ICD-10-CM | POA: Diagnosis not present

## 2016-10-10 DIAGNOSIS — D631 Anemia in chronic kidney disease: Secondary | ICD-10-CM | POA: Diagnosis not present

## 2016-10-10 DIAGNOSIS — D509 Iron deficiency anemia, unspecified: Secondary | ICD-10-CM | POA: Diagnosis not present

## 2016-10-10 DIAGNOSIS — E1129 Type 2 diabetes mellitus with other diabetic kidney complication: Secondary | ICD-10-CM | POA: Diagnosis not present

## 2016-10-10 DIAGNOSIS — N2581 Secondary hyperparathyroidism of renal origin: Secondary | ICD-10-CM | POA: Diagnosis not present

## 2016-10-13 DIAGNOSIS — N186 End stage renal disease: Secondary | ICD-10-CM | POA: Diagnosis not present

## 2016-10-13 DIAGNOSIS — E1129 Type 2 diabetes mellitus with other diabetic kidney complication: Secondary | ICD-10-CM | POA: Diagnosis not present

## 2016-10-13 DIAGNOSIS — D631 Anemia in chronic kidney disease: Secondary | ICD-10-CM | POA: Diagnosis not present

## 2016-10-13 DIAGNOSIS — D509 Iron deficiency anemia, unspecified: Secondary | ICD-10-CM | POA: Diagnosis not present

## 2016-10-13 DIAGNOSIS — N2581 Secondary hyperparathyroidism of renal origin: Secondary | ICD-10-CM | POA: Diagnosis not present

## 2016-10-15 DIAGNOSIS — E1129 Type 2 diabetes mellitus with other diabetic kidney complication: Secondary | ICD-10-CM | POA: Diagnosis not present

## 2016-10-15 DIAGNOSIS — D631 Anemia in chronic kidney disease: Secondary | ICD-10-CM | POA: Diagnosis not present

## 2016-10-15 DIAGNOSIS — N2581 Secondary hyperparathyroidism of renal origin: Secondary | ICD-10-CM | POA: Diagnosis not present

## 2016-10-15 DIAGNOSIS — N186 End stage renal disease: Secondary | ICD-10-CM | POA: Diagnosis not present

## 2016-10-15 DIAGNOSIS — D509 Iron deficiency anemia, unspecified: Secondary | ICD-10-CM | POA: Diagnosis not present

## 2016-10-16 DIAGNOSIS — J209 Acute bronchitis, unspecified: Secondary | ICD-10-CM | POA: Diagnosis not present

## 2016-10-16 DIAGNOSIS — R0989 Other specified symptoms and signs involving the circulatory and respiratory systems: Secondary | ICD-10-CM | POA: Diagnosis not present

## 2016-10-16 DIAGNOSIS — J111 Influenza due to unidentified influenza virus with other respiratory manifestations: Secondary | ICD-10-CM | POA: Diagnosis not present

## 2016-10-16 DIAGNOSIS — N186 End stage renal disease: Secondary | ICD-10-CM | POA: Diagnosis not present

## 2016-10-16 DIAGNOSIS — Z6834 Body mass index (BMI) 34.0-34.9, adult: Secondary | ICD-10-CM | POA: Diagnosis not present

## 2016-10-18 DIAGNOSIS — Z992 Dependence on renal dialysis: Secondary | ICD-10-CM | POA: Diagnosis not present

## 2016-10-18 DIAGNOSIS — N186 End stage renal disease: Secondary | ICD-10-CM | POA: Diagnosis not present

## 2016-10-18 DIAGNOSIS — E1129 Type 2 diabetes mellitus with other diabetic kidney complication: Secondary | ICD-10-CM | POA: Diagnosis not present

## 2016-10-20 DIAGNOSIS — E1129 Type 2 diabetes mellitus with other diabetic kidney complication: Secondary | ICD-10-CM | POA: Diagnosis not present

## 2016-10-20 DIAGNOSIS — D631 Anemia in chronic kidney disease: Secondary | ICD-10-CM | POA: Diagnosis not present

## 2016-10-20 DIAGNOSIS — N2581 Secondary hyperparathyroidism of renal origin: Secondary | ICD-10-CM | POA: Diagnosis not present

## 2016-10-20 DIAGNOSIS — N186 End stage renal disease: Secondary | ICD-10-CM | POA: Diagnosis not present

## 2016-10-22 DIAGNOSIS — D631 Anemia in chronic kidney disease: Secondary | ICD-10-CM | POA: Diagnosis not present

## 2016-10-22 DIAGNOSIS — N186 End stage renal disease: Secondary | ICD-10-CM | POA: Diagnosis not present

## 2016-10-22 DIAGNOSIS — N2581 Secondary hyperparathyroidism of renal origin: Secondary | ICD-10-CM | POA: Diagnosis not present

## 2016-10-22 DIAGNOSIS — E1129 Type 2 diabetes mellitus with other diabetic kidney complication: Secondary | ICD-10-CM | POA: Diagnosis not present

## 2016-10-24 DIAGNOSIS — N186 End stage renal disease: Secondary | ICD-10-CM | POA: Diagnosis not present

## 2016-10-24 DIAGNOSIS — D631 Anemia in chronic kidney disease: Secondary | ICD-10-CM | POA: Diagnosis not present

## 2016-10-24 DIAGNOSIS — E1129 Type 2 diabetes mellitus with other diabetic kidney complication: Secondary | ICD-10-CM | POA: Diagnosis not present

## 2016-10-24 DIAGNOSIS — N2581 Secondary hyperparathyroidism of renal origin: Secondary | ICD-10-CM | POA: Diagnosis not present

## 2016-10-27 ENCOUNTER — Inpatient Hospital Stay (HOSPITAL_COMMUNITY)
Admission: EM | Admit: 2016-10-27 | Discharge: 2016-10-30 | DRG: 291 | Disposition: A | Discharge status: Home / Self Care | Payer: Medicare Other | Attending: Internal Medicine | Admitting: Internal Medicine

## 2016-10-27 ENCOUNTER — Emergency Department (HOSPITAL_COMMUNITY): Payer: Medicare Other

## 2016-10-27 ENCOUNTER — Encounter (HOSPITAL_COMMUNITY): Payer: Self-pay | Admitting: *Deleted

## 2016-10-27 DIAGNOSIS — Z833 Family history of diabetes mellitus: Secondary | ICD-10-CM | POA: Diagnosis not present

## 2016-10-27 DIAGNOSIS — I081 Rheumatic disorders of both mitral and tricuspid valves: Secondary | ICD-10-CM | POA: Diagnosis present

## 2016-10-27 DIAGNOSIS — D649 Anemia, unspecified: Secondary | ICD-10-CM | POA: Diagnosis not present

## 2016-10-27 DIAGNOSIS — Z794 Long term (current) use of insulin: Secondary | ICD-10-CM

## 2016-10-27 DIAGNOSIS — I10 Essential (primary) hypertension: Secondary | ICD-10-CM

## 2016-10-27 DIAGNOSIS — J9601 Acute respiratory failure with hypoxia: Secondary | ICD-10-CM | POA: Diagnosis not present

## 2016-10-27 DIAGNOSIS — Z8249 Family history of ischemic heart disease and other diseases of the circulatory system: Secondary | ICD-10-CM

## 2016-10-27 DIAGNOSIS — I5043 Acute on chronic combined systolic (congestive) and diastolic (congestive) heart failure: Secondary | ICD-10-CM

## 2016-10-27 DIAGNOSIS — I509 Heart failure, unspecified: Secondary | ICD-10-CM

## 2016-10-27 DIAGNOSIS — Z7982 Long term (current) use of aspirin: Secondary | ICD-10-CM | POA: Diagnosis not present

## 2016-10-27 DIAGNOSIS — Z7951 Long term (current) use of inhaled steroids: Secondary | ICD-10-CM

## 2016-10-27 DIAGNOSIS — R0902 Hypoxemia: Secondary | ICD-10-CM | POA: Diagnosis not present

## 2016-10-27 DIAGNOSIS — R300 Dysuria: Secondary | ICD-10-CM | POA: Diagnosis present

## 2016-10-27 DIAGNOSIS — E8809 Other disorders of plasma-protein metabolism, not elsewhere classified: Secondary | ICD-10-CM | POA: Diagnosis present

## 2016-10-27 DIAGNOSIS — K219 Gastro-esophageal reflux disease without esophagitis: Secondary | ICD-10-CM | POA: Diagnosis present

## 2016-10-27 DIAGNOSIS — N186 End stage renal disease: Secondary | ICD-10-CM | POA: Diagnosis present

## 2016-10-27 DIAGNOSIS — Z885 Allergy status to narcotic agent status: Secondary | ICD-10-CM | POA: Diagnosis not present

## 2016-10-27 DIAGNOSIS — Z90710 Acquired absence of both cervix and uterus: Secondary | ICD-10-CM

## 2016-10-27 DIAGNOSIS — Z9842 Cataract extraction status, left eye: Secondary | ICD-10-CM | POA: Diagnosis not present

## 2016-10-27 DIAGNOSIS — Z6833 Body mass index (BMI) 33.0-33.9, adult: Secondary | ICD-10-CM

## 2016-10-27 DIAGNOSIS — M898X9 Other specified disorders of bone, unspecified site: Secondary | ICD-10-CM | POA: Diagnosis present

## 2016-10-27 DIAGNOSIS — Z888 Allergy status to other drugs, medicaments and biological substances status: Secondary | ICD-10-CM

## 2016-10-27 DIAGNOSIS — E1122 Type 2 diabetes mellitus with diabetic chronic kidney disease: Secondary | ICD-10-CM | POA: Diagnosis present

## 2016-10-27 DIAGNOSIS — E1140 Type 2 diabetes mellitus with diabetic neuropathy, unspecified: Secondary | ICD-10-CM | POA: Diagnosis present

## 2016-10-27 DIAGNOSIS — E1129 Type 2 diabetes mellitus with other diabetic kidney complication: Secondary | ICD-10-CM | POA: Diagnosis not present

## 2016-10-27 DIAGNOSIS — R609 Edema, unspecified: Secondary | ICD-10-CM

## 2016-10-27 DIAGNOSIS — R062 Wheezing: Secondary | ICD-10-CM

## 2016-10-27 DIAGNOSIS — E1121 Type 2 diabetes mellitus with diabetic nephropathy: Secondary | ICD-10-CM | POA: Diagnosis present

## 2016-10-27 DIAGNOSIS — I120 Hypertensive chronic kidney disease with stage 5 chronic kidney disease or end stage renal disease: Secondary | ICD-10-CM | POA: Diagnosis not present

## 2016-10-27 DIAGNOSIS — Z992 Dependence on renal dialysis: Secondary | ICD-10-CM

## 2016-10-27 DIAGNOSIS — Z9841 Cataract extraction status, right eye: Secondary | ICD-10-CM

## 2016-10-27 DIAGNOSIS — R109 Unspecified abdominal pain: Secondary | ICD-10-CM

## 2016-10-27 DIAGNOSIS — E669 Obesity, unspecified: Secondary | ICD-10-CM | POA: Diagnosis present

## 2016-10-27 DIAGNOSIS — E8889 Other specified metabolic disorders: Secondary | ICD-10-CM | POA: Diagnosis present

## 2016-10-27 DIAGNOSIS — R1012 Left upper quadrant pain: Secondary | ICD-10-CM | POA: Diagnosis not present

## 2016-10-27 DIAGNOSIS — Z841 Family history of disorders of kidney and ureter: Secondary | ICD-10-CM | POA: Diagnosis not present

## 2016-10-27 DIAGNOSIS — D631 Anemia in chronic kidney disease: Secondary | ICD-10-CM | POA: Diagnosis not present

## 2016-10-27 DIAGNOSIS — N2581 Secondary hyperparathyroidism of renal origin: Secondary | ICD-10-CM | POA: Diagnosis present

## 2016-10-27 DIAGNOSIS — J449 Chronic obstructive pulmonary disease, unspecified: Secondary | ICD-10-CM | POA: Diagnosis present

## 2016-10-27 DIAGNOSIS — Z853 Personal history of malignant neoplasm of breast: Secondary | ICD-10-CM | POA: Diagnosis not present

## 2016-10-27 DIAGNOSIS — I132 Hypertensive heart and chronic kidney disease with heart failure and with stage 5 chronic kidney disease, or end stage renal disease: Principal | ICD-10-CM | POA: Diagnosis present

## 2016-10-27 DIAGNOSIS — R509 Fever, unspecified: Secondary | ICD-10-CM | POA: Diagnosis not present

## 2016-10-27 DIAGNOSIS — I272 Pulmonary hypertension, unspecified: Secondary | ICD-10-CM | POA: Diagnosis present

## 2016-10-27 DIAGNOSIS — R05 Cough: Secondary | ICD-10-CM

## 2016-10-27 DIAGNOSIS — J9621 Acute and chronic respiratory failure with hypoxia: Secondary | ICD-10-CM | POA: Diagnosis present

## 2016-10-27 DIAGNOSIS — R059 Cough, unspecified: Secondary | ICD-10-CM

## 2016-10-27 DIAGNOSIS — R9431 Abnormal electrocardiogram [ECG] [EKG]: Secondary | ICD-10-CM

## 2016-10-27 DIAGNOSIS — R918 Other nonspecific abnormal finding of lung field: Secondary | ICD-10-CM | POA: Diagnosis not present

## 2016-10-27 DIAGNOSIS — Z91040 Latex allergy status: Secondary | ICD-10-CM

## 2016-10-27 LAB — CBC
HCT: 32.4 % — ABNORMAL LOW (ref 36.0–46.0)
Hemoglobin: 10.3 g/dL — ABNORMAL LOW (ref 12.0–15.0)
MCH: 28.8 pg (ref 26.0–34.0)
MCHC: 31.8 g/dL (ref 30.0–36.0)
MCV: 90.5 fL (ref 78.0–100.0)
Platelets: 270 10*3/uL (ref 150–400)
RBC: 3.58 MIL/uL — ABNORMAL LOW (ref 3.87–5.11)
RDW: 18 % — ABNORMAL HIGH (ref 11.5–15.5)
WBC: 6.1 10*3/uL (ref 4.0–10.5)

## 2016-10-27 LAB — COMPREHENSIVE METABOLIC PANEL
ALT: 54 U/L (ref 14–54)
AST: 38 U/L (ref 15–41)
Albumin: 3 g/dL — ABNORMAL LOW (ref 3.5–5.0)
Alkaline Phosphatase: 159 U/L — ABNORMAL HIGH (ref 38–126)
Anion gap: 13 (ref 5–15)
BUN: 14 mg/dL (ref 6–20)
CHLORIDE: 103 mmol/L (ref 101–111)
CO2: 26 mmol/L (ref 22–32)
CREATININE: 4.87 mg/dL — AB (ref 0.44–1.00)
Calcium: 8.1 mg/dL — ABNORMAL LOW (ref 8.9–10.3)
GFR calc Af Amer: 10 mL/min — ABNORMAL LOW (ref >60)
GFR calc non Af Amer: 9 mL/min — ABNORMAL LOW (ref >60)
GLUCOSE: 132 mg/dL — AB (ref 65–99)
Potassium: 3.5 mmol/L (ref 3.5–5.1)
SODIUM: 142 mmol/L (ref 135–145)
Total Bilirubin: 1.1 mg/dL (ref 0.3–1.2)
Total Protein: 6.3 g/dL — ABNORMAL LOW (ref 6.5–8.1)

## 2016-10-27 LAB — RENAL FUNCTION PANEL
Albumin: 3.4 g/dL — ABNORMAL LOW (ref 3.5–5.0)
Anion gap: 12 (ref 5–15)
BUN: 19 mg/dL (ref 6–20)
CO2: 28 mmol/L (ref 22–32)
Calcium: 9 mg/dL (ref 8.9–10.3)
Chloride: 99 mmol/L — ABNORMAL LOW (ref 101–111)
Creatinine, Ser: 5.95 mg/dL — ABNORMAL HIGH (ref 0.44–1.00)
GFR calc Af Amer: 8 mL/min — ABNORMAL LOW (ref >60)
GFR calc non Af Amer: 7 mL/min — ABNORMAL LOW (ref >60)
Glucose, Bld: 251 mg/dL — ABNORMAL HIGH (ref 65–99)
Phosphorus: 4.6 mg/dL (ref 2.5–4.6)
Potassium: 4.5 mmol/L (ref 3.5–5.1)
Sodium: 139 mmol/L (ref 135–145)

## 2016-10-27 LAB — CBC WITH DIFFERENTIAL/PLATELET
BASOS ABS: 0 10*3/uL (ref 0.0–0.1)
Basophils Relative: 0 %
EOS ABS: 0.1 10*3/uL (ref 0.0–0.7)
Eosinophils Relative: 2 %
HCT: 29.5 % — ABNORMAL LOW (ref 36.0–46.0)
Hemoglobin: 9.5 g/dL — ABNORMAL LOW (ref 12.0–15.0)
LYMPHS ABS: 1.7 10*3/uL (ref 0.7–4.0)
Lymphocytes Relative: 24 %
MCH: 29.1 pg (ref 26.0–34.0)
MCHC: 32.2 g/dL (ref 30.0–36.0)
MCV: 90.2 fL (ref 78.0–100.0)
Monocytes Absolute: 0.6 10*3/uL (ref 0.1–1.0)
Monocytes Relative: 8 %
NEUTROS PCT: 66 %
Neutro Abs: 4.6 10*3/uL (ref 1.7–7.7)
PLATELETS: 237 10*3/uL (ref 150–400)
RBC: 3.27 MIL/uL — AB (ref 3.87–5.11)
RDW: 17.8 % — ABNORMAL HIGH (ref 11.5–15.5)
WBC: 7 10*3/uL (ref 4.0–10.5)

## 2016-10-27 LAB — URINALYSIS, ROUTINE W REFLEX MICROSCOPIC
BILIRUBIN URINE: NEGATIVE
Glucose, UA: 150 mg/dL — AB
HGB URINE DIPSTICK: NEGATIVE
Ketones, ur: NEGATIVE mg/dL
Leukocytes, UA: NEGATIVE
NITRITE: NEGATIVE
Protein, ur: 100 mg/dL — AB
SPECIFIC GRAVITY, URINE: 1.012 (ref 1.005–1.030)
pH: 8 (ref 5.0–8.0)

## 2016-10-27 LAB — BRAIN NATRIURETIC PEPTIDE: B Natriuretic Peptide: 2605.8 pg/mL — ABNORMAL HIGH (ref 0.0–100.0)

## 2016-10-27 LAB — I-STAT TROPONIN, ED: TROPONIN I, POC: 0.03 ng/mL (ref 0.00–0.08)

## 2016-10-27 LAB — LIPASE, BLOOD: Lipase: 17 U/L (ref 11–51)

## 2016-10-27 LAB — GLUCOSE, CAPILLARY: GLUCOSE-CAPILLARY: 312 mg/dL — AB (ref 65–99)

## 2016-10-27 MED ORDER — SODIUM CHLORIDE 0.9 % IV SOLN: 100.0000 mL | INTRAVENOUS | Status: DC | PRN

## 2016-10-27 MED ORDER — ALTEPLASE 2 MG IJ SOLR: 2.0000 mg | Freq: Once | INTRAMUSCULAR | Status: DC | PRN

## 2016-10-27 MED ORDER — DIAZEPAM 5 MG PO TABS: 2.5000 mg | ORAL_TABLET | Freq: Three times a day (TID) | ORAL | Status: DC | PRN

## 2016-10-27 MED ORDER — DOXERCALCIFEROL 4 MCG/2ML IV SOLN
3.0000 ug | Freq: Once | INTRAVENOUS | Status: AC
  Administered 2016-10-28: 3 ug via INTRAVENOUS
  Filled 2016-10-27: qty 2

## 2016-10-27 MED ORDER — ONDANSETRON HCL 4 MG/2ML IJ SOLN
4.0000 mg | Freq: Four times a day (QID) | INTRAMUSCULAR | Status: DC | PRN
  Administered 2016-10-29: 4 mg via INTRAVENOUS
  Filled 2016-10-27: qty 2

## 2016-10-27 MED ORDER — ALBUTEROL SULFATE (2.5 MG/3ML) 0.083% IN NEBU: 2.5000 mg | INHALATION_SOLUTION | Freq: Four times a day (QID) | RESPIRATORY_TRACT | Status: DC | PRN

## 2016-10-27 MED ORDER — FENTANYL CITRATE (PF) 100 MCG/2ML IJ SOLN
50.0000 ug | Freq: Once | INTRAMUSCULAR | Status: AC
  Administered 2016-10-27: 50 ug via INTRAVENOUS
  Filled 2016-10-27: qty 2

## 2016-10-27 MED ORDER — LIDOCAINE HCL (PF) 1 % IJ SOLN: 5.0000 mL | INTRAMUSCULAR | Status: DC | PRN

## 2016-10-27 MED ORDER — SODIUM CHLORIDE 0.9% FLUSH: 3.0000 mL | INTRAVENOUS | Status: DC | PRN

## 2016-10-27 MED ORDER — SODIUM CHLORIDE 0.9 % IV SOLN
100.0000 mL | INTRAVENOUS | Status: DC | PRN
Start: 2016-10-27 — End: 2016-10-28

## 2016-10-27 MED ORDER — FUROSEMIDE 10 MG/ML IJ SOLN
40.0000 mg | Freq: Once | INTRAMUSCULAR | Status: AC
  Administered 2016-10-27: 40 mg via INTRAVENOUS
  Filled 2016-10-27: qty 4

## 2016-10-27 MED ORDER — ALBUTEROL SULFATE (2.5 MG/3ML) 0.083% IN NEBU: 3.0000 mL | INHALATION_SOLUTION | Freq: Four times a day (QID) | RESPIRATORY_TRACT | Status: DC | PRN

## 2016-10-27 MED ORDER — DOCUSATE SODIUM 283 MG RE ENEM
1.0000 | ENEMA | Freq: Once | RECTAL | Status: DC
  Filled 2016-10-27: qty 1

## 2016-10-27 MED ORDER — DARBEPOETIN ALFA 60 MCG/0.3ML IJ SOSY: 60.0000 ug | PREFILLED_SYRINGE | INTRAMUSCULAR | Status: DC

## 2016-10-27 MED ORDER — INSULIN ASPART 100 UNIT/ML ~~LOC~~ SOLN
7.0000 [IU] | Freq: Once | SUBCUTANEOUS | Status: AC
  Administered 2016-10-27: 7 [IU] via SUBCUTANEOUS

## 2016-10-27 MED ORDER — ASPIRIN 81 MG PO CHEW
81.0000 mg | CHEWABLE_TABLET | Freq: Every day | ORAL | Status: DC
  Administered 2016-10-28 – 2016-10-30 (×3): 81 mg via ORAL
  Filled 2016-10-27 (×3): qty 1

## 2016-10-27 MED ORDER — LIDOCAINE-PRILOCAINE 2.5-2.5 % EX CREA: 1.0000 "application " | TOPICAL_CREAM | CUTANEOUS | Status: DC | PRN

## 2016-10-27 MED ORDER — HEPARIN SODIUM (PORCINE) 1000 UNIT/ML DIALYSIS
20.0000 [IU]/kg | INTRAMUSCULAR | Status: DC | PRN
  Administered 2016-10-28: 1900 [IU] via INTRAVENOUS_CENTRAL

## 2016-10-27 MED ORDER — PANTOPRAZOLE SODIUM 20 MG PO TBEC
20.0000 mg | DELAYED_RELEASE_TABLET | Freq: Every day | ORAL | Status: DC
  Administered 2016-10-28 – 2016-10-30 (×3): 20 mg via ORAL
  Filled 2016-10-27 (×3): qty 1

## 2016-10-27 MED ORDER — GI COCKTAIL ~~LOC~~
30.0000 mL | Freq: Once | ORAL | Status: AC
  Administered 2016-10-27: 30 mL via ORAL
  Filled 2016-10-27: qty 30

## 2016-10-27 MED ORDER — FUROSEMIDE 10 MG/ML IJ SOLN: 40.0000 mg | Freq: Every day | INTRAMUSCULAR | Status: DC

## 2016-10-27 MED ORDER — METHYLPREDNISOLONE SODIUM SUCC 125 MG IJ SOLR
125.0000 mg | Freq: Once | INTRAMUSCULAR | Status: AC
  Administered 2016-10-27: 125 mg via INTRAVENOUS
  Filled 2016-10-27: qty 2

## 2016-10-27 MED ORDER — DOXERCALCIFEROL 4 MCG/2ML IV SOLN
3.0000 ug | INTRAVENOUS | Status: DC
  Filled 2016-10-27: qty 2

## 2016-10-27 MED ORDER — LISINOPRIL 40 MG PO TABS
40.0000 mg | ORAL_TABLET | Freq: Every day | ORAL | Status: DC
  Administered 2016-10-27 – 2016-10-29 (×3): 40 mg via ORAL
  Filled 2016-10-27 (×3): qty 1

## 2016-10-27 MED ORDER — ACETAMINOPHEN 325 MG PO TABS: 650.0000 mg | ORAL_TABLET | ORAL | Status: DC | PRN

## 2016-10-27 MED ORDER — NA FERRIC GLUC CPLX IN SUCROSE 12.5 MG/ML IV SOLN
125.0000 mg | INTRAVENOUS | Status: DC
  Administered 2016-10-29: 125 mg via INTRAVENOUS
  Filled 2016-10-27 (×2): qty 10

## 2016-10-27 MED ORDER — INSULIN ASPART 100 UNIT/ML ~~LOC~~ SOLN
0.0000 [IU] | Freq: Three times a day (TID) | SUBCUTANEOUS | Status: DC
Start: 2016-10-28 — End: 2016-10-30
  Administered 2016-10-28: 1 [IU] via SUBCUTANEOUS
  Administered 2016-10-28: 3 [IU] via SUBCUTANEOUS
  Administered 2016-10-29 (×3): 2 [IU] via SUBCUTANEOUS
  Administered 2016-10-30: 1 [IU] via SUBCUTANEOUS
  Administered 2016-10-30: 7 [IU] via SUBCUTANEOUS
  Administered 2016-10-30: 2 [IU] via SUBCUTANEOUS

## 2016-10-27 MED ORDER — HEPARIN SODIUM (PORCINE) 1000 UNIT/ML DIALYSIS: 1000.0000 [IU] | INTRAMUSCULAR | Status: DC | PRN

## 2016-10-27 MED ORDER — LANTHANUM CARBONATE 500 MG PO CHEW
2000.0000 mg | CHEWABLE_TABLET | Freq: Three times a day (TID) | ORAL | Status: DC
  Administered 2016-10-28 – 2016-10-30 (×8): 2000 mg via ORAL
  Filled 2016-10-27 (×8): qty 4

## 2016-10-27 MED ORDER — IPRATROPIUM BROMIDE 0.02 % IN SOLN
0.5000 mg | Freq: Once | RESPIRATORY_TRACT | Status: AC
  Administered 2016-10-27: 0.5 mg via RESPIRATORY_TRACT
  Filled 2016-10-27: qty 2.5

## 2016-10-27 MED ORDER — BENZONATATE 100 MG PO CAPS
100.0000 mg | ORAL_CAPSULE | Freq: Three times a day (TID) | ORAL | Status: DC | PRN
  Administered 2016-10-28 – 2016-10-29 (×2): 100 mg via ORAL
  Filled 2016-10-27 (×3): qty 1

## 2016-10-27 MED ORDER — PENTAFLUOROPROP-TETRAFLUOROETH EX AERO: 1.0000 "application " | INHALATION_SPRAY | CUTANEOUS | Status: DC | PRN

## 2016-10-27 MED ORDER — FENTANYL CITRATE (PF) 100 MCG/2ML IJ SOLN: 25.0000 ug | INTRAMUSCULAR | Status: DC | PRN

## 2016-10-27 MED ORDER — INSULIN DETEMIR 100 UNIT/ML ~~LOC~~ SOLN
12.0000 [IU] | Freq: Every day | SUBCUTANEOUS | Status: DC
  Administered 2016-10-28 – 2016-10-30 (×3): 12 [IU] via SUBCUTANEOUS
  Filled 2016-10-27 (×3): qty 0.12

## 2016-10-27 MED ORDER — DEXTROSE 5 % IV SOLN
1.0000 g | INTRAVENOUS | Status: DC
  Administered 2016-10-27 – 2016-10-29 (×3): 1 g via INTRAVENOUS
  Filled 2016-10-27 (×4): qty 10

## 2016-10-27 MED ORDER — ONDANSETRON HCL 4 MG/2ML IJ SOLN
4.0000 mg | Freq: Once | INTRAMUSCULAR | Status: AC
  Administered 2016-10-27: 4 mg via INTRAVENOUS
  Filled 2016-10-27: qty 2

## 2016-10-27 MED ORDER — PREDNISONE 20 MG PO TABS
40.0000 mg | ORAL_TABLET | Freq: Every day | ORAL | Status: DC
  Administered 2016-10-28 – 2016-10-30 (×3): 40 mg via ORAL
  Filled 2016-10-27 (×3): qty 2

## 2016-10-27 MED ORDER — SODIUM CHLORIDE 0.9 % IV SOLN: 250.0000 mL | INTRAVENOUS | Status: DC | PRN

## 2016-10-27 MED ORDER — HEPARIN SODIUM (PORCINE) 5000 UNIT/ML IJ SOLN
5000.0000 [IU] | Freq: Three times a day (TID) | INTRAMUSCULAR | Status: DC
  Administered 2016-10-27 – 2016-10-30 (×8): 5000 [IU] via SUBCUTANEOUS
  Filled 2016-10-27 (×6): qty 1

## 2016-10-27 MED ORDER — CARVEDILOL 12.5 MG PO TABS
12.5000 mg | ORAL_TABLET | Freq: Two times a day (BID) | ORAL | Status: DC
  Administered 2016-10-27 – 2016-10-30 (×5): 12.5 mg via ORAL
  Filled 2016-10-27 (×5): qty 1

## 2016-10-27 MED ORDER — FAMOTIDINE IN NACL 20-0.9 MG/50ML-% IV SOLN
20.0000 mg | Freq: Once | INTRAVENOUS | Status: AC
  Administered 2016-10-27: 20 mg via INTRAVENOUS
  Filled 2016-10-27: qty 50

## 2016-10-27 MED ORDER — FLUTICASONE PROPIONATE 50 MCG/ACT NA SUSP
1.0000 | Freq: Every day | NASAL | Status: DC
  Administered 2016-10-28 – 2016-10-30 (×3): 2 via NASAL
  Filled 2016-10-27: qty 16

## 2016-10-27 MED ORDER — ALBUTEROL SULFATE (2.5 MG/3ML) 0.083% IN NEBU
5.0000 mg | INHALATION_SOLUTION | Freq: Once | RESPIRATORY_TRACT | Status: AC
  Administered 2016-10-27: 5 mg via RESPIRATORY_TRACT
  Filled 2016-10-27: qty 6

## 2016-10-27 MED ORDER — CLONIDINE HCL 0.1 MG PO TABS
0.1000 mg | ORAL_TABLET | ORAL | Status: DC
  Administered 2016-10-27 – 2016-10-30 (×5): 0.1 mg via ORAL
  Filled 2016-10-27 (×5): qty 1

## 2016-10-27 MED ORDER — DOCUSATE SODIUM 100 MG PO CAPS
100.0000 mg | ORAL_CAPSULE | Freq: Every day | ORAL | Status: DC
Start: 2016-10-28 — End: 2016-10-30
  Administered 2016-10-28 – 2016-10-30 (×3): 100 mg via ORAL
  Filled 2016-10-27 (×3): qty 1

## 2016-10-27 MED ORDER — SODIUM CHLORIDE 0.9% FLUSH
3.0000 mL | Freq: Two times a day (BID) | INTRAVENOUS | Status: DC
  Administered 2016-10-27 – 2016-10-30 (×5): 3 mL via INTRAVENOUS

## 2016-10-27 NOTE — ED Notes (Signed)
Attempted report x1. 

## 2016-10-27 NOTE — ED Provider Notes (Signed)
[facility] DEPT Provider Note   CSN: [id number] Arrival date & time: 10/27/16  1219     History   Chief Complaint Chief Complaint  Patient presents with  . Abdominal Pain    HPI [name] is a 66 y.o. female with a PMHx of ESRD on dialysis, anemia, CHF (Echo 11/2014 EF 40-45%), GERD, HTN, neuropathy, DM2, and remote L breast CA s/p lumpectomy, and PSHx of hysterectomy, who presents to the ED with complaints of ongoing left-sided abdominal pain 2-3 months as well as a cough. Patient states that back in February 2018 she was diagnosed with influenza, was having some abdominal pain, was seen in the ED and had a CT without contrast that showed anasarca and a liver lesion that had been seen prior but without any acute findings. She was discharged with Valium and Tessalon Perles which helped somewhat. Between then and now she was given an antibiotic (unknown name) for her cough, prescribed by her PCP Dr. Dagmar Hait, and initially her cough had improved but over the last 1 month her cough has worsened, causing her abd pain to worsen. She describes the pain as 10/10 constant soreness in her left abdomen and epigastric area, radiating to her left flank area, worse with coughing and movement/turning, and mildly improved with Valium. No other treatments tried. She reports that her cough is productive of clear sputum, has improved somewhat with Mucinex and Tessalon as well as the antibiotic that was given to her, but over the last month she has noticed that it worsened. She also reports intermittent nausea, wheezing, rhinorrhea, shortness of breath, and today at dialysis she believes that she had a fever of 100.1. She just finished dialysis prior to arrival. Of note, she states that she has chronic bilateral leg swelling which she is supposed to be using stockings for but has not been compliant with this, at dialysis she does not prop her legs up; states this issue is chronic and unchanged. Does not take  any fluid pills for this. Her PCP is Dr. Dagmar Hait at [facility]. Her nephrologist is Dr. Mercy Moore as well as Dr. Marval Regal when she's at dialysis. Denies home oxygen use. Per pt, she is not on lasix or other fluid pills. She DOES produce urine still.  She denies CP, hemoptysis, V/D/C, obstipation, melena, hematochezia, hematuria, dysuria, vaginal bleeding/discharge, myalgias, arthralgias, numbness, tingling, focal weakness, or any other complaints at this time. Denies recent travel, sick contacts, suspicious food intake, EtOH use, or NSAID use.   The history is provided by the patient and medical records. No language interpreter was used.  Abdominal Pain   This is a recurrent problem. The current episode started more than 1 week ago. The problem occurs constantly. The problem has not changed since onset.Associated with: recent illness. The pain is located in the LUQ, LLQ and epigastric region. Quality: soreness. The pain is at a severity of 10/10. The pain is severe. Associated symptoms include fever (100.1 Tmax earlier) and nausea. Pertinent negatives include diarrhea, flatus, hematochezia, melena, vomiting, constipation, dysuria, hematuria, arthralgias and myalgias. The symptoms are aggravated by coughing and certain positions. Relieved by: valium. Past workup includes CT scan. Her past medical history is significant for GERD.    Past Medical History:  Diagnosis Date  . Anemia   . Anxiety   . Arthritis    "joints" (06/15/2013)  . Blood transfusion without reported diagnosis   . Breast cancer ([place])    left  . CHF (congestive heart failure) ([place])   .  ESRD (end stage renal disease) ([place])    "suppose to start dialysis today" (06/15/2013)  . GERD (gastroesophageal reflux disease)   . Hypertension   . Myalgia 12/31/2011  . Neuropathy ([place]) 12/31/2011  . Shortness of breath    "lately it's been all the time" (06/15/2013)  . Type II diabetes mellitus [facility])     Patient Active  Problem List   Diagnosis Date Noted  . Poor venous access 02/08/2015  . End stage renal disease ([place]) 07/08/2013  . Anemia 06/15/2013  . CHF (congestive heart failure) ([place]) 06/15/2013  . Diabetes ([place]) 06/15/2013  . Chronic kidney disease (CKD), stage IV (severe) ([place]) 06/03/2013  . Myalgia 12/31/2011  . Neuropathy ([place]) 12/31/2011  . Breast cancer of upper-inner quadrant of left female breast ([place]) 06/27/2011    Past Surgical History:  Procedure Laterality Date  . ABDOMINAL HYSTERECTOMY     partial  . AV FISTULA PLACEMENT Right 06/06/2013   Procedure: ARTERIOVENOUS (AV) FISTULA CREATION-RIGHT BRACHIAL CEPHALIC;  Surgeon: Conrad Riverside, MD;  Location: [facility];  Service: Vascular;  Laterality: Right;  . BREAST BIOPSY Left   . BREAST LUMPECTOMY Left    "and took out some lymph nodes" (06/15/2013)  . CATARACT EXTRACTION W/ ANTERIOR VITRECTOMY Bilateral   . CESAREAN SECTION  1980  . EYE SURGERY Bilateral    laser surgery  . REFRACTIVE SURGERY Bilateral   . REMOVAL OF A DIALYSIS CATHETER Right 06/06/2013   Procedure: REMOVAL OF RIGHT MEDIPORT;  Surgeon: Conrad [place], MD;  Location: [facility];  Service: Vascular;  Laterality: Right;  . TONSILLECTOMY      OB History    No data available       Home Medications    Prior to Admission medications   Medication Sig Start Date End Date Taking? Authorizing Provider  acetaminophen (TYLENOL) 500 MG tablet Take 1,000 mg by mouth every 4 (four) hours as needed for mild pain, moderate pain, fever or headache.    Historical Provider, MD  aspirin 81 MG chewable tablet Chew 81 mg by mouth daily.    Historical Provider, MD  benzonatate (TESSALON PERLES) 100 MG capsule Take 1 capsule (100 mg total) by mouth 3 (three) times daily as needed for cough. 08/28/16   Courteney Lyn Mackuen, MD  carvedilol (COREG) 12.5 MG tablet Take 12.5 mg by mouth 2 (two) times daily with a meal.    Historical Provider, MD  cloNIDine (CATAPRES) 0.1 MG tablet Take 0.1 mg by  mouth 2 (two) times daily.    Historical Provider, MD  darbepoetin (ARANESP) 200 MCG/0.4ML SOLN injection Inject 0.4 mLs (200 mcg total) into the vein every Wednesday with hemodialysis. Patient not taking: Reported on 08/28/2016 06/22/13   Geradine Girt, DO  diazepam (VALIUM) 5 MG tablet Take 0.5 tablets (2.5 mg total) by mouth every 8 (eight) hours as needed for muscle spasms. 08/28/16   Courteney Lyn Mackuen, MD  Docusate Calcium (STOOL SOFTENER PO) Take 1 capsule by mouth daily.    Historical Provider, MD  doxercalciferol (HECTOROL) 4 MCG/2ML injection Inject 0.5 mLs (1 mcg total) into the vein every Monday, Wednesday, and Friday with hemodialysis. Patient not taking: Reported on 08/28/2016 06/18/13   Geradine Girt, DO  fluticasone (FLONASE) 50 MCG/ACT nasal spray Place 1-2 sprays into both nostrils daily.    Historical Provider, MD  insulin aspart (NOVOLOG FLEXPEN) 100 UNIT/ML FlexPen Inject 12-16 Units into the skin 3 (three) times daily with meals. Sliding scale    Historical Provider,  MD  Insulin Detemir (LEVEMIR FLEXTOUCH) 100 UNIT/ML Pen Inject 12 Units into the skin daily with lunch.    Historical Provider, MD  lanthanum (FOSRENOL) 1000 MG chewable tablet Chew 2,000 mg by mouth 3 (three) times daily with meals.     Historical Provider, MD  lisinopril (PRINIVIL,ZESTRIL) 40 MG tablet Take 40 mg by mouth at bedtime.     Historical Provider, MD  Multiple Vitamin (MULTIVITAMIN WITH MINERALS) TABS tablet Take 1 tablet by mouth daily.    Historical Provider, MD  omeprazole (PRILOSEC OTC) 20 MG tablet Take 20 mg by mouth daily.    Historical Provider, MD  pantoprazole (PROTONIX) 20 MG tablet Take 1 tablet (20 mg total) by mouth daily. Patient taking differently: Take 20 mg by mouth daily as needed for heartburn.  02/08/15   [name], MD  pantoprazole (PROTONIX) 40 MG tablet take 1 tablet by mouth once daily BEFORE BREAKFAST Patient not taking: Reported on 08/28/2016 03/12/16   [name], MD    Phenylephrine-DM-GG (ROBITUSSIN COUGH/COLD CF) 5-10-100 MG/5ML LIQD Take 7.5 mLs by mouth every 4 (four) hours as needed (for cough).    Historical Provider, MD  Phenylephrine-DM-GG-APAP (MUCINEX FAST-MAX COLD FLU) 5-10-200-325 MG/10ML LIQD Take 7.5 mLs by mouth once.    Historical Provider, MD  PROAIR HFA 108 (90 BASE) MCG/ACT inhaler Inhale 1-2 puffs into the lungs every 6 (six) hours as needed for wheezing or shortness of breath.  10/09/14   Historical Provider, MD  simethicone (GAS-X) 80 MG chewable tablet Chew 80 mg by mouth as needed for flatulence.    Historical Provider, MD    Family History Family History  Problem Relation Age of Onset  . Diabetes Mother   . Hyperlipidemia Mother   . Hypertension Mother   . Hypertension Father   . Diabetes Sister   . Diabetes Brother   . Hypertension Brother   . Heart attack Brother   . Kidney disease Brother   . Colon cancer Neg Hx   . Colon polyps Neg Hx   . Esophageal cancer Neg Hx   . Gallbladder disease Neg Hx     Social History Social History  Substance Use Topics  . Smoking status: Never Smoker  . Smokeless tobacco: Never Used  . Alcohol use No     Allergies   Oxycodone; Tramadol; and Latex   Review of Systems Review of Systems  Constitutional: Positive for fever (100.1 Tmax earlier).  HENT: Positive for rhinorrhea. Negative for sore throat.   Respiratory: Positive for cough, shortness of breath and wheezing.   Cardiovascular: Positive for leg swelling (chronic, at baseline). Negative for chest pain.  Gastrointestinal: Positive for abdominal pain and nausea. Negative for blood in stool, constipation, diarrhea, flatus, hematochezia, melena and vomiting.  Genitourinary: Negative for dysuria, hematuria, vaginal bleeding and vaginal discharge.  Musculoskeletal: Negative for arthralgias and myalgias.  Skin: Negative for color change.  Allergic/Immunologic: Positive for immunocompromised state (DM2).  Neurological: Negative  for weakness and numbness.  Psychiatric/Behavioral: Negative for confusion.   10 Systems reviewed and are negative for acute change except as noted in the HPI.   Physical Exam Updated Vital Signs BP (!) 183/70   Pulse 76   Temp 98.3 F (36.8 C) (Oral)   Resp (!) 25   Ht 5' 6"  (1.676 m)   Wt 93.9 kg   SpO2 94%   BMI 33.41 kg/m   Physical Exam  Constitutional: She is oriented to person, place, and time. Vital signs are  normal. She appears well-developed and well-nourished.  Non-toxic appearance. No distress.  Afebrile, nontoxic, NAD  HENT:  Head: Normocephalic and atraumatic.  Mouth/Throat: Oropharynx is clear and moist and mucous membranes are normal.  Eyes: Conjunctivae and EOM are normal. Right eye exhibits no discharge. Left eye exhibits no discharge.  Neck: Normal range of motion. Neck supple.  Cardiovascular: Normal rate, regular rhythm, normal heart sounds and intact distal pulses.  Exam reveals no gallop and no friction rub.   No murmur heard. RRR, nl s1/s2, no m/r/g, distal pulses intact, 3-4+ b/l pedal edema which is baseline per pt  Pulmonary/Chest: Effort normal. No respiratory distress. She has no decreased breath sounds. She has wheezes. She has rhonchi. She has no rales.  Scattered rhonchi and wheezing throughout all lung fields, no appreciable rales, no increased WOB, speaking in full sentences, SpO2 92-94% on RA when sitting at rest however with speaking her SpO2 drops intermittently down to 88-89% on RA, then bounces back up once she stops talking  Abdominal: Soft. Normal appearance and bowel sounds are normal. She exhibits no distension. There is tenderness in the epigastric area and left upper quadrant. There is no rigidity, no rebound, no guarding, no CVA tenderness, no tenderness at McBurney's point and negative Murphy's sign.  Soft, obese but nondistended, +BS throughout, with mild epigastric/LUQ TTP, no r/g/r, neg murphy's, neg mcburney's, no CVA TTP  Pt  occasionally splinting her L abdomen when she coughs during exam  Musculoskeletal: Normal range of motion.  MAE x4 Strength and sensation grossly intact in all extremities Distal pulses intact 3-4+ b/l pedal edema, neg homan's bilaterally   Neurological: She is alert and oriented to person, place, and time. She has normal strength. No sensory deficit.  Skin: Skin is warm, dry and intact. No rash noted.  Psychiatric: She has a normal mood and affect.  Nursing note and vitals reviewed.    ED Treatments / Results  Labs (all labs ordered are listed, but only abnormal results are displayed) Labs Reviewed  CBC WITH DIFFERENTIAL/PLATELET - Abnormal; Notable for the following:       Result Value   RBC 3.27 (*)    Hemoglobin 9.5 (*)    HCT 29.5 (*)    RDW 17.8 (*)    All other components within normal limits  COMPREHENSIVE METABOLIC PANEL - Abnormal; Notable for the following:    Glucose, Bld 132 (*)    Creatinine, Ser 4.87 (*)    Calcium 8.1 (*)    Total Protein 6.3 (*)    Albumin 3.0 (*)    Alkaline Phosphatase 159 (*)    GFR calc non Af Amer 9 (*)    GFR calc Af Amer 10 (*)    All other components within normal limits  BRAIN NATRIURETIC PEPTIDE - Abnormal; Notable for the following:    B Natriuretic Peptide 2,605.8 (*)    All other components within normal limits  LIPASE, BLOOD  URINALYSIS, ROUTINE W REFLEX MICROSCOPIC  I-STAT TROPOININ, ED    EKG  EKG Interpretation  Date/Time:  Monday October 27 2016 12:38:05 EDT Ventricular Rate:  76 PR Interval:    QRS Duration: 113 QT Interval:  373 QTC Calculation: 420 R Axis:   51 Text Interpretation:  Sinus rhythm Probable LVH with secondary repol abnrm Confirmed by Wilson Singer  MD, STEPHEN [phone]) on 10/27/2016 3:57:21 PM       Radiology Dg Abd Acute W/chest  Result Date: 10/27/2016 CLINICAL DATA:  LEFT abdominal pain. EXAM: DG ABDOMEN  ACUTE W/ 1V CHEST COMPARISON:  CT abdomen and pelvis 08/28/2016. FINDINGS: Cardiomegaly. Possible  early interstitial edema, with mildly prominent interstitial markings throughout both lung fields. No effusion. No definite consolidation. Nonobstructive gas pattern. Enteric contrast remains within the colon, and it is unclear if this is from the previous CT study or an interval administration of barium. No osseous findings. No visible calcifications. IMPRESSION: Cardiomegaly. Possible early interstitial edema without definite consolidation. Nonobstructive gas pattern. Enteric contrast throughout the colon, query from prior CT or an additional barium study. Electronically Signed   By: Staci Righter M.D.   On: 10/27/2016 14:03     11/28/14 Dobutamine Stress Test @[facility] : SUMMARY The patient had no chest pain during stress The patient achieved 86 % of maximum predicted heart rate. Positive stress ECG for inducible ischemia at target heart rate. There is mild global hypokinesis of the left ventricular wall The estimated LV ejection fraction is 45% . There was a decrease in left ventricular function with dobutamine. There was left ventricular dilatation with dobutamine. Multiple segments involviing basal and mid anteroseptal, septal, anterior and  anterolateral segments worsened with dobutamine stress to become more  severely hypokinetic or akinetic. Positive dobutamine stress echocardiogram for inducible ischemia at target  heart rate. Of note, patient had severe hypertension during dobutamine stress, an d this  can impact wall motion. Clinical correlation suggested.  11/28/14 Transthoracic Echo @[facility] : SUMMARY The left ventricle is mildly dilated. There is normal left ventricular wall thickness.  Left ventricular systolic function is mildly reduced. LV ejection fraction = 40-45%.  Left ventricular filling pattern is indeterminate. There is mild global hypokinesis of the left ventricle. The right ventricle is normal in size and function. The left atrium is mildly dilated. The right atrium  is mildly dilated. There is moderate mitral regurgitation. The mitral regurgitant jet is eccentrically directed. The regurgitant jet is posteriorly-directed. Estimated right ventricular systolic pressure is 60 mmHg. Moderate pulmonary hypertension. There is mild to moderate tricuspid regurgitation. There is no pericardial effusion. There is no comparison study available.   Procedures Procedures (including critical care time)  Medications Ordered in ED Medications  furosemide (LASIX) injection 40 mg (not administered)  albuterol (PROVENTIL) (2.5 MG/3ML) 0.083% nebulizer solution 5 mg (5 mg Nebulization Given 10/27/16 1404)  ipratropium (ATROVENT) nebulizer solution 0.5 mg (0.5 mg Nebulization Given 10/27/16 1404)  methylPREDNISolone sodium succinate (SOLU-MEDROL) 125 mg/2 mL injection 125 mg (125 mg Intravenous Given 10/27/16 1405)  fentaNYL (SUBLIMAZE) injection 50 mcg (50 mcg Intravenous Given 10/27/16 1405)  ondansetron (ZOFRAN) injection 4 mg (4 mg Intravenous Given 10/27/16 1405)  gi cocktail (Maalox,Lidocaine,Donnatal) (30 mLs Oral Given 10/27/16 1436)  famotidine (PEPCID) IVPB 20 mg premix (0 mg Intravenous Stopped 10/27/16 1509)     Initial Impression / Assessment and Plan / ED Course  I have reviewed the triage vital signs and the nursing notes.  Pertinent labs & imaging results that were available during my care of the patient were reviewed by me and considered in my medical decision making (see chart for details).     66 y.o. female here with intermittent L abd pain x2-3 months, and worsening cough x1 month; was seen 08/28/16 in the ED for similar complaints, CT abd/pelv without contrast done and showed liver lesion and anasarca but otherwise nothing else seen; she was already +influenza tested; abd pain felt to be from cough, sent home with tesslon and valium which helped. States she was given abx by her PCP sometime last month, unknown what abx  it was. Initially cough improved but now  worse x1 month. On exam, wheezing, intermittently hypoxic to 88% on RA when speaking, but doesn't seem to have difficulty completing full sentences, and once she stops talking her SpO2 goes back to 92-94% on RA. No rales audible, but scattered rhonchi and wheezing throughout. 3-4+ b/l pitting edema noted, which pt states is chronic and typical, not using her compression stockings. Abd with mild TTP in epigastrum/LUQ region, nonperitoneal. No flank tenderness. Overall abd pain seems likely from the coughing, she splints her side when she coughs. Her primary issue sounds like the cough. Will give pain meds, nausea meds, GI cocktail, pepcid, solumedrol, and duoneb. Will get labs, BNP, trop, EKG, and Acute abd series to include chest and abd. Will hold off on any other imaging at this time, will see if we find anything on the xrays and labs, and see if she improves with duoneb or not. If not, then may consider further work up/imaging. Will hold off on lasix for now, until we have more of her work up completed. Discussed case with my attending Dr. Wilson Singer who agrees with plan. Will reassess shortly.  4:04 PM CBC w/diff with stable anemia. CMP with stable kidney function, mildly elevated alk phos of unclear etiology but likely from chronic kidney disease bony changes; stable hypoalbuminemia, total protein low as well. Lipase WNL. Trop negative. EKG with LVH but otherwise without acute ischemic findings. Acute abd series with cardiomegaly and possible early interstitial edema without definite consolidation; also shows nonobstructive gas pattern with enteric contrast throughout colon, unclear source for this since I can't find any recent CTs in our system (08/28/16 was last CT and it was without contrast) or in care everywhere, pt denies doing any recent contrast studies that she's aware of. U/A not yet obtained. Pt feeling better after duoneb and pain/nausea meds, but SpO2 trended down to 89% on RA so she's on O2 via [place] now  and SpO2 99% with that. BNP 2,605.8, no prior for comparison, but I suspect that her symptoms may be related to acute on chronic CHF; given her hypoxia, will proceed with lasix and admission for CHF exacerbation; may need dialysis during admission to help take fluid off, given her ESRD status, lasix alone may not help; pt DOES produce urine, however. Discussed case with my attending Dr. Wilson Singer who agrees with plan. Of note, lung sounds still slightly wheezy, especially in RUL/RLL but given that it seems this is more of a fluid overload issue, will hold off on repeat duoneb. Will continue to monitor and await admission consult page.  4:21 PM Dr. Lonny Prude of [facility] returning page and will admit. Holding orders to be placed by admitting team. Please see their notes for further documentation of care. I appreciate their help with this pleasant pt's care. Pt stable at time of admission.    Final Clinical Impressions(s) / ED Diagnoses   Final diagnoses:  Cough  Left sided abdominal pain  Chronic anemia  Hypoalbuminemia  Peripheral edema  Acute on chronic congestive heart failure, unspecified congestive heart failure type (HCC)  Hypoxia  Abnormal finding on EKG  ESRD on dialysis [facility])    New Prescriptions New Prescriptions   No medications on file     [address] PA-C 10/27/16 [place], [place] 11/04/16 [phone]

## 2016-10-27 NOTE — ED Notes (Signed)
Pharmacy at bedside

## 2016-10-27 NOTE — H&P (Signed)
History and Physical    [name] MEB:[id number] DOB: [birth year] DOA: 10/27/2016  PCP: Tivis Ringer, MD  Patient coming from: Home  Chief Complaint: Abdominal pain  HPI: [name] is a 67 y.o. female with medical history significant of ESRD, Diabetes mellitus, combined CHF, hypertension, breast cancer. This morning, while at dialysis, she reports worsening left abdominal pain described as achy consistently but sharp when she coughs. Pain is located in LUQ and LLQ pain with radiation to left flank. She has been seen for this for the past two months and has been treated with cough suppressants, vialium and antibiotics. She received antibiotics about two weeks ago for her coughing. CT scan in February was unremarkable for etiology.  ED Course: Vitals: Afebrile, normal pulse, normal respiratory rate, hypertensive. Labs: Creatine of 4.87. Alkaline phosphatase of 159, BNP of 2600, hemoglobin of 9.5 Imaging: chest/abd x-ray significant for cardiomegaly, possible early interstitial edema and evidence of retained enteric contrast. Medications/Course: Fentanyl, atrovent, albuterol, solu-medrol, zofran, furosemide given.  Review of Systems: Review of Systems  Constitutional: Positive for fever (101 at dialysis). Negative for chills.  HENT: Negative for sore throat.   Respiratory: Positive for cough, sputum production and shortness of breath.   Cardiovascular: Positive for orthopnea (2 pillows), leg swelling and PND. Negative for chest pain and palpitations.  Gastrointestinal: Positive for abdominal pain. Negative for blood in stool, constipation, diarrhea, melena, nausea and vomiting.  Genitourinary: Positive for dysuria.  All other systems reviewed and are negative.   Past Medical History:  Diagnosis Date  . Anemia   . Anxiety   . Arthritis    "joints" (06/15/2013)  . Blood transfusion without reported diagnosis   . Breast cancer ([place])    left  . CHF (congestive heart  failure) ([place])   . ESRD (end stage renal disease) ([place])    "suppose to start dialysis today" (06/15/2013)  . GERD (gastroesophageal reflux disease)   . Hypertension   . Myalgia 12/31/2011  . Neuropathy ([place]) 12/31/2011  . Shortness of breath    "lately it's been all the time" (06/15/2013)  . Type II diabetes mellitus ([place])     Past Surgical History:  Procedure Laterality Date  . ABDOMINAL HYSTERECTOMY     partial  . AV FISTULA PLACEMENT Right 06/06/2013   Procedure: ARTERIOVENOUS (AV) FISTULA CREATION-RIGHT BRACHIAL CEPHALIC;  Surgeon: Conrad Quincy, MD;  Location: [facility];  Service: Vascular;  Laterality: Right;  . BREAST BIOPSY Left   . BREAST LUMPECTOMY Left    "and took out some lymph nodes" (06/15/2013)  . CATARACT EXTRACTION W/ ANTERIOR VITRECTOMY Bilateral   . CESAREAN SECTION  1980  . EYE SURGERY Bilateral    laser surgery  . REFRACTIVE SURGERY Bilateral   . REMOVAL OF A DIALYSIS CATHETER Right 06/06/2013   Procedure: REMOVAL OF RIGHT MEDIPORT;  Surgeon: Conrad [place], MD;  Location: [facility];  Service: Vascular;  Laterality: Right;  . TONSILLECTOMY       reports that she has never smoked. She has never used smokeless tobacco. She reports that she does not drink alcohol or use drugs.  Allergies  Allergen Reactions  . Oxycodone Other (See Comments)    Hallucinations   . Tramadol Other (See Comments)    hallucinations   . Latex Hives    Family History  Problem Relation Age of Onset  . Diabetes Mother   . Hyperlipidemia Mother   . Hypertension Mother   . Hypertension Father   . Diabetes Sister   .  Diabetes Brother   . Hypertension Brother   . Heart attack Brother   . Kidney disease Brother   . Colon cancer Neg Hx   . Colon polyps Neg Hx   . Esophageal cancer Neg Hx   . Gallbladder disease Neg Hx     Prior to Admission medications   Medication Sig Start Date End Date Taking? Authorizing Provider  acetaminophen (TYLENOL) 500 MG tablet Take 1,000 mg by mouth  every 4 (four) hours as needed for mild pain, moderate pain, fever or headache.    Historical Provider, MD  aspirin 81 MG chewable tablet Chew 81 mg by mouth daily.    Historical Provider, MD  benzonatate (TESSALON PERLES) 100 MG capsule Take 1 capsule (100 mg total) by mouth 3 (three) times daily as needed for cough. 08/28/16   Courteney Lyn Mackuen, MD  carvedilol (COREG) 12.5 MG tablet Take 12.5 mg by mouth 2 (two) times daily with a meal.    Historical Provider, MD  cloNIDine (CATAPRES) 0.1 MG tablet Take 0.1 mg by mouth 2 (two) times daily.    Historical Provider, MD  darbepoetin (ARANESP) 200 MCG/0.4ML SOLN injection Inject 0.4 mLs (200 mcg total) into the vein every Wednesday with hemodialysis. Patient not taking: Reported on 08/28/2016 06/22/13   Geradine Girt, DO  diazepam (VALIUM) 5 MG tablet Take 0.5 tablets (2.5 mg total) by mouth every 8 (eight) hours as needed for muscle spasms. 08/28/16   Courteney Lyn Mackuen, MD  Docusate Calcium (STOOL SOFTENER PO) Take 1 capsule by mouth daily.    Historical Provider, MD  doxercalciferol (HECTOROL) 4 MCG/2ML injection Inject 0.5 mLs (1 mcg total) into the vein every Monday, Wednesday, and Friday with hemodialysis. Patient not taking: Reported on 08/28/2016 06/18/13   Geradine Girt, DO  fluticasone (FLONASE) 50 MCG/ACT nasal spray Place 1-2 sprays into both nostrils daily.    Historical Provider, MD  insulin aspart (NOVOLOG FLEXPEN) 100 UNIT/ML FlexPen Inject 12-16 Units into the skin 3 (three) times daily with meals. Sliding scale    Historical Provider, MD  Insulin Detemir (LEVEMIR FLEXTOUCH) 100 UNIT/ML Pen Inject 12 Units into the skin daily with lunch.    Historical Provider, MD  lanthanum (FOSRENOL) 1000 MG chewable tablet Chew 2,000 mg by mouth 3 (three) times daily with meals.     Historical Provider, MD  lisinopril (PRINIVIL,ZESTRIL) 40 MG tablet Take 40 mg by mouth at bedtime.     Historical Provider, MD  Multiple Vitamin (MULTIVITAMIN WITH  MINERALS) TABS tablet Take 1 tablet by mouth daily.    Historical Provider, MD  omeprazole (PRILOSEC OTC) 20 MG tablet Take 20 mg by mouth daily.    Historical Provider, MD  pantoprazole (PROTONIX) 20 MG tablet Take 1 tablet (20 mg total) by mouth daily. Patient taking differently: Take 20 mg by mouth daily as needed for heartburn.  02/08/15   [name], MD  pantoprazole (PROTONIX) 40 MG tablet take 1 tablet by mouth once daily BEFORE BREAKFAST Patient not taking: Reported on 08/28/2016 03/12/16   [name], MD  Phenylephrine-DM-GG (ROBITUSSIN COUGH/COLD CF) 5-10-100 MG/5ML LIQD Take 7.5 mLs by mouth every 4 (four) hours as needed (for cough).    Historical Provider, MD  Phenylephrine-DM-GG-APAP (MUCINEX FAST-MAX COLD FLU) 5-10-200-325 MG/10ML LIQD Take 7.5 mLs by mouth once.    Historical Provider, MD  PROAIR HFA 108 (90 BASE) MCG/ACT inhaler Inhale 1-2 puffs into the lungs every 6 (six) hours as needed for wheezing or shortness of  breath.  10/09/14   Historical Provider, MD  simethicone (GAS-X) 80 MG chewable tablet Chew 80 mg by mouth as needed for flatulence.    Historical Provider, MD    Physical Exam: Vitals:   10/27/16 1530 10/27/16 1545 10/27/16 1600 10/27/16 1615  BP: (!) 175/73 (!) 186/77 (!) 165/95 (!) 182/79  Pulse: 77 76 79 77  Resp: (!) 32 15 (!) 21 12  Temp:      TempSrc:      SpO2: 99% 100% 100% 98%  Weight:      Height:         Constitutional: NAD, calm, comfortable Eyes: PERRL, lids and conjunctivae normal. Left prosthetic ENMT: Mucous membranes are moist. Posterior pharynx clear of any exudate or lesions.  Neck: normal, supple, no masses, no thyromegaly. JVD Respiratory: Mild end-expiratory wheezing, mild rales. Normal respiratory effort. No accessory muscle use.  Cardiovascular: Regular rate and rhythm, 2/6 systolic murmur. 2+ pitting edema. 2+ pedal pulses. Abdomen: no tenderness, no masses palpated. Bowel sounds positive.  Musculoskeletal: no clubbing /  cyanosis. Left second digit hyperextended. Good ROM, no contractures. Normal muscle tone.  Skin: no rashes, lesions, ulcers. No induration Neurologic: CN 2-12 grossly intact. Sensation intact, DTR normal. Strength 4/5 in all 4.  Psychiatric: Normal judgment and insight. Alert and oriented x 3. Normal mood.   Labs on Admission: I have personally reviewed following labs and imaging studies  CBC:  Recent Labs Lab 10/27/16 1452  WBC 7.0  NEUTROABS 4.6  HGB 9.5*  HCT 29.5*  MCV 90.2  PLT 413   Basic Metabolic Panel:  Recent Labs Lab 10/27/16 1452  NA 142  K 3.5  CL 103  CO2 26  GLUCOSE 132*  BUN 14  CREATININE 4.87*  CALCIUM 8.1*   GFR: Estimated Creatinine Clearance: 13.6 mL/min (A) (by C-G formula based on SCr of 4.87 mg/dL (H)).   Liver Function Tests:  Recent Labs Lab 10/27/16 1452  AST 38  ALT 54  ALKPHOS 159*  BILITOT 1.1  PROT 6.3*  ALBUMIN 3.0*    Recent Labs Lab 10/27/16 1452  LIPASE 17   Urine analysis:    Component Value Date/Time   COLORURINE YELLOW 08/28/2016 1629   APPEARANCEUR CLEAR 08/28/2016 1629   LABSPEC 1.018 08/28/2016 1629   LABSPEC 1.025 02/13/2011 1533   PHURINE 6.0 08/28/2016 1629   GLUCOSEU 150 (A) 08/28/2016 1629   HGBUR SMALL (A) 08/28/2016 1629   BILIRUBINUR NEGATIVE 08/28/2016 1629   BILIRUBINUR Negative 02/13/2011 1533   KETONESUR NEGATIVE 08/28/2016 1629   PROTEINUR 100 (A) 08/28/2016 1629   UROBILINOGEN 0.2 04/28/2011 1045   NITRITE NEGATIVE 08/28/2016 1629   LEUKOCYTESUR NEGATIVE 08/28/2016 1629   LEUKOCYTESUR Negative 02/13/2011 1533    Radiological Exams on Admission: Dg Abd Acute W/chest  Result Date: 10/27/2016 CLINICAL DATA:  LEFT abdominal pain. EXAM: DG ABDOMEN ACUTE W/ 1V CHEST COMPARISON:  CT abdomen and pelvis 08/28/2016. FINDINGS: Cardiomegaly. Possible early interstitial edema, with mildly prominent interstitial markings throughout both lung fields. No effusion. No definite consolidation.  Nonobstructive gas pattern. Enteric contrast remains within the colon, and it is unclear if this is from the previous CT study or an interval administration of barium. No osseous findings. No visible calcifications. IMPRESSION: Cardiomegaly. Possible early interstitial edema without definite consolidation. Nonobstructive gas pattern. Enteric contrast throughout the colon, query from prior CT or an additional barium study. Electronically Signed   By: Staci Righter M.D.   On: 10/27/2016 14:03    EKG: Independently reviewed.  Sinus rhythm. LVH.   Assessment/Plan Principal Problem:   Acute respiratory failure with hypoxia (HCC) Active Problems:   CHF (congestive heart failure) (HCC)   Diabetes (HCC)   End stage renal disease (HCC)   Essential hypertension   GERD (gastroesophageal reflux disease)   Acute respiratory failure with hypoxia Appears secondary to fluid overload. Patient last had dialysis this morning. Currently in no respiratory distress so urgent dialysis not needed. BNP elevated in setting of ESRD. -continue oxygen via , wean to room air -continue lasix IV -echocardiogram -may need dialysis tomorrow  Acute on chronic combined systolic and diastolic CHF Possible mild or early CHF. Unknown dry weight. Nephrology to help with this. -continue lisinopril -continue carvedilol -daily weights/in and outs -echocardiogram  Left sided abdominal pain Abnormal x-ray/constipation/impaction Possibly musculoskeletal from repeated coughing. Patient has listed allergies to oxycodone and Tramadol with hallucinations listed. Given fentanyl in ED. Upon review of x-ray, it appears patient has retained contrast in areas of discomfort. This is likely contributing highly to her symptoms -Tylenol -fentanyl prn for severe pain as patient has adverse effects with other opiates -disimpaction -enema  Dysuria Patient makes small amounts of urine and has symptoms with each void -urinalysis  pending -urine culture -ceftriaxone  Cough Wheezing on exam. No history of COPD. No smoking history. -Continue Tessalon -Continue Albuterol prn -continue prednisone  Diabetes mellitus, type 2 Last A1C in our system in 2012 of 10.3%. -SSI -continue Levemir  Anemia Secondary to ESRD -management per nephrology  ESRD On dialysis. MWF. Dr. Marval Regal follows as outpatient -nephrology consulted  Essential hypertension Uncontrolled. Fluid status likely contributing. -continue carvedilol -continue Coreg -continue clonidine  GERD -continue Protonix   DVT prophylaxis: Heparin Code Status: Full code Family Communication: Son, daughter and granddaughter at bedside Disposition Plan: Discharge home in several days Consults called: Nephrology, Dr. Ihor Austin Admission status: Inpatient, telemetry   Cordelia Poche, MD [facility] Pager [phone]  If 7PM-7AM, please contact night-coverage [email or web address] Password [email or web address]  10/27/2016, 4:18 PM

## 2016-10-27 NOTE — ED Notes (Signed)
Patient transported to X-ray 

## 2016-10-27 NOTE — ED Notes (Signed)
Admitting at bedside 

## 2016-10-27 NOTE — ED Notes (Addendum)
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    Patient's saturations 88% on RA.  Placed on 2L [place]

## 2016-10-27 NOTE — ED Triage Notes (Signed)
Pt arrives from dialysis via [facility]. Pt states she received her entire tx PTA. Pt states she has had abdominal pain that radiates to the left flank x3-4 weeks and some intermittent nausea. Pt was recently treated for a respiratory infection and finished her antibiotics last week.

## 2016-10-28 ENCOUNTER — Inpatient Hospital Stay (HOSPITAL_COMMUNITY): Payer: Medicare Other

## 2016-10-28 DIAGNOSIS — J9601 Acute respiratory failure with hypoxia: Secondary | ICD-10-CM

## 2016-10-28 LAB — CBC
HCT: 30.1 % — ABNORMAL LOW (ref 36.0–46.0)
HEMATOCRIT: 29.8 % — AB (ref 36.0–46.0)
HEMOGLOBIN: 9.7 g/dL — AB (ref 12.0–15.0)
Hemoglobin: 9.8 g/dL — ABNORMAL LOW (ref 12.0–15.0)
MCH: 29.1 pg (ref 26.0–34.0)
MCH: 29 pg (ref 26.0–34.0)
MCHC: 32.6 g/dL (ref 30.0–36.0)
MCHC: 32.6 g/dL (ref 30.0–36.0)
MCV: 89.5 fL (ref 78.0–100.0)
MCV: 89.1 fL (ref 78.0–100.0)
Platelets: 249 10*3/uL (ref 150–400)
Platelets: 250 10*3/uL (ref 150–400)
RBC: 3.33 MIL/uL — AB (ref 3.87–5.11)
RBC: 3.38 MIL/uL — ABNORMAL LOW (ref 3.87–5.11)
RDW: 18.1 % — ABNORMAL HIGH (ref 11.5–15.5)
RDW: 18.4 % — ABNORMAL HIGH (ref 11.5–15.5)
WBC: 7.1 10*3/uL (ref 4.0–10.5)
WBC: 8.5 10*3/uL (ref 4.0–10.5)

## 2016-10-28 LAB — GLUCOSE, CAPILLARY
GLUCOSE-CAPILLARY: 344 mg/dL — AB (ref 65–99)
Glucose-Capillary: 271 mg/dL — ABNORMAL HIGH (ref 65–99)
Glucose-Capillary: 134 mg/dL — ABNORMAL HIGH (ref 65–99)
Glucose-Capillary: 239 mg/dL — ABNORMAL HIGH (ref 65–99)

## 2016-10-28 LAB — RENAL FUNCTION PANEL
Albumin: 3.1 g/dL — ABNORMAL LOW (ref 3.5–5.0)
Anion gap: 11 (ref 5–15)
BUN: 19 mg/dL (ref 6–20)
CO2: 27 mmol/L (ref 22–32)
Calcium: 8.7 mg/dL — ABNORMAL LOW (ref 8.9–10.3)
Chloride: 94 mmol/L — ABNORMAL LOW (ref 101–111)
Creatinine, Ser: 4.88 mg/dL — ABNORMAL HIGH (ref 0.44–1.00)
GFR calc Af Amer: 10 mL/min — ABNORMAL LOW (ref >60)
GFR calc non Af Amer: 9 mL/min — ABNORMAL LOW (ref >60)
Glucose, Bld: 392 mg/dL — ABNORMAL HIGH (ref 65–99)
Phosphorus: 4 mg/dL (ref 2.5–4.6)
Potassium: 4.4 mmol/L (ref 3.5–5.1)
Sodium: 132 mmol/L — ABNORMAL LOW (ref 135–145)

## 2016-10-28 LAB — MRSA PCR SCREENING: MRSA by PCR: NEGATIVE

## 2016-10-28 LAB — BASIC METABOLIC PANEL
Anion gap: 12 (ref 5–15)
BUN: 26 mg/dL — ABNORMAL HIGH (ref 6–20)
CO2: 28 mmol/L (ref 22–32)
Calcium: 8.8 mg/dL — ABNORMAL LOW (ref 8.9–10.3)
Chloride: 97 mmol/L — ABNORMAL LOW (ref 101–111)
Creatinine, Ser: 6.67 mg/dL — ABNORMAL HIGH (ref 0.44–1.00)
GFR calc Af Amer: 7 mL/min — ABNORMAL LOW (ref >60)
GFR calc non Af Amer: 6 mL/min — ABNORMAL LOW (ref >60)
GLUCOSE: 301 mg/dL — AB (ref 65–99)
Potassium: 4.7 mmol/L (ref 3.5–5.1)
Sodium: 137 mmol/L (ref 135–145)

## 2016-10-28 LAB — HIV ANTIBODY (ROUTINE TESTING W REFLEX): HIV Screen 4th Generation wRfx: NONREACTIVE

## 2016-10-28 MED ORDER — DOXERCALCIFEROL 4 MCG/2ML IV SOLN
INTRAVENOUS | Status: AC
  Administered 2016-10-28: 3 ug via INTRAVENOUS
  Filled 2016-10-28: qty 2

## 2016-10-28 MED ORDER — RENA-VITE PO TABS
1.0000 | ORAL_TABLET | Freq: Every day | ORAL | Status: DC
  Administered 2016-10-28 – 2016-10-29 (×2): 1 via ORAL
  Filled 2016-10-28 (×2): qty 1

## 2016-10-28 MED ORDER — ALTEPLASE 2 MG IJ SOLR: 2.0000 mg | Freq: Once | INTRAMUSCULAR | Status: DC | PRN

## 2016-10-28 MED ORDER — SODIUM CHLORIDE 0.9 % IV SOLN: 100.0000 mL | INTRAVENOUS | Status: DC | PRN

## 2016-10-28 MED ORDER — BISACODYL 10 MG RE SUPP: 10.0000 mg | Freq: Every day | RECTAL | Status: DC | PRN

## 2016-10-28 MED ORDER — INSULIN ASPART 100 UNIT/ML ~~LOC~~ SOLN
4.0000 [IU] | Freq: Once | SUBCUTANEOUS | Status: AC
  Administered 2016-10-28: 4 [IU] via SUBCUTANEOUS

## 2016-10-28 MED ORDER — IPRATROPIUM-ALBUTEROL 0.5-2.5 (3) MG/3ML IN SOLN
RESPIRATORY_TRACT | Status: AC
  Administered 2016-10-28: 3 mL
  Filled 2016-10-28: qty 3

## 2016-10-28 MED ORDER — IPRATROPIUM-ALBUTEROL 0.5-2.5 (3) MG/3ML IN SOLN: 3.0000 mL | Freq: Four times a day (QID) | RESPIRATORY_TRACT | Status: DC

## 2016-10-28 MED ORDER — PENTAFLUOROPROP-TETRAFLUOROETH EX AERO: 1.0000 "application " | INHALATION_SPRAY | CUTANEOUS | Status: DC | PRN

## 2016-10-28 MED ORDER — LIDOCAINE HCL (PF) 1 % IJ SOLN: 5.0000 mL | INTRAMUSCULAR | Status: DC | PRN

## 2016-10-28 MED ORDER — LIDOCAINE-PRILOCAINE 2.5-2.5 % EX CREA: 1.0000 "application " | TOPICAL_CREAM | CUTANEOUS | Status: DC | PRN

## 2016-10-28 MED ORDER — ALBUTEROL SULFATE (2.5 MG/3ML) 0.083% IN NEBU
5.0000 mg | INHALATION_SOLUTION | Freq: Four times a day (QID) | RESPIRATORY_TRACT | Status: DC
Start: 2016-10-28 — End: 2016-10-28

## 2016-10-28 MED ORDER — SENNOSIDES 8.8 MG/5ML PO SYRP
5.0000 mL | ORAL_SOLUTION | Freq: Two times a day (BID) | ORAL | Status: DC
  Administered 2016-10-28 – 2016-10-30 (×4): 5 mL via ORAL
  Filled 2016-10-28 (×7): qty 5

## 2016-10-28 MED ORDER — HEPARIN SODIUM (PORCINE) 1000 UNIT/ML DIALYSIS
20.0000 [IU]/kg | INTRAMUSCULAR | Status: DC | PRN
  Administered 2016-10-29: 1900 [IU] via INTRAVENOUS_CENTRAL

## 2016-10-28 MED ORDER — HEPARIN SODIUM (PORCINE) 1000 UNIT/ML DIALYSIS: 1000.0000 [IU] | INTRAMUSCULAR | Status: DC | PRN

## 2016-10-28 NOTE — Evaluation (Signed)
Physical Therapy Evaluation Patient Details Name: [name] MRN: [id number] DOB: 05/13/1951 Today's Date: 10/28/2016   History of Present Illness  66 y.o. female with medical history significant of ESRD on HD MWF, Diabetes mellitus, combined CHF, hypertension, breast cancer, was on HD Monday and started to experience left sided abd pain and worsening dyspnea.  Clinical Impression  Pt admitted with/for complications above..  Pt currently limited functionally due to the problems listed below.  (see problems list.)  Pt will benefit from PT to maximize function and conditioning, but will not need followup.     Follow Up Recommendations No PT follow up    Equipment Recommendations  None recommended by PT    Recommendations for Other Services       Precautions / Restrictions Precautions Precautions: Fall      Mobility  Bed Mobility Overal bed mobility: Modified Independent                Transfers Overall transfer level: Modified independent                  Ambulation/Gait Ambulation/Gait assistance: Min guard Ambulation Distance (Feet): 140 Feet Assistive device: None Gait Pattern/deviations: Step-through pattern   Gait velocity interpretation: Below normal speed for age/gender General Gait Details: mild stagger as she fatigued, but no overt LOB  Stairs            Wheelchair Mobility    Modified Rankin (Stroke Patients Only)       Balance Overall balance assessment: Needs assistance Sitting-balance support: Feet supported Sitting balance-Leahy Scale: Good     Standing balance support: No upper extremity supported Standing balance-Leahy Scale: Fair Standing balance comment: standing on 1 leg, sliding foot into other shoe without hand hold                             Pertinent Vitals/Pain Pain Assessment: Faces Faces Pain Scale: Hurts a little bit Pain Location: l stomach/flank Pain Descriptors / Indicators:  Aching Pain Intervention(s): Monitored during session    Home Living Family/patient expects to be discharged to:: Private residence Living Arrangements: Alone Available Help at Discharge: Family;Available PRN/intermittently Type of Home: House Home Access: Stairs to enter Entrance Stairs-Rails: Psychiatric nurse of Steps: several Home Layout: One level        Prior Function Level of Independence: Independent         Comments: Scat to HD, but does drive     Hand Dominance        Extremity/Trunk Assessment   Upper Extremity Assessment Upper Extremity Assessment: Defer to OT evaluation (bil proximal weakness, but functional)    Lower Extremity Assessment Lower Extremity Assessment: Overall WFL for tasks assessed (bil proximal weakness, but functional)       Communication   Communication: No difficulties  Cognition Arousal/Alertness: Awake/alert;Lethargic Behavior During Therapy: WFL for tasks assessed/performed Overall Cognitive Status: Within Functional Limits for tasks assessed                                        General Comments      Exercises     Assessment/Plan    PT Assessment Patient needs continued PT services  PT Problem List Decreased strength;Decreased activity tolerance;Decreased mobility       PT Treatment Interventions Gait training;Functional mobility training;Stair training;Therapeutic activities;Balance training;Patient/family education  PT Goals (Current goals can be found in the Care Plan section)  Acute Rehab PT Goals Patient Stated Goal: figure out what is wrong with my stomach PT Goal Formulation: With patient Time For Goal Achievement: 11/04/16 Potential to Achieve Goals: Good    Frequency Min 2X/week   Barriers to discharge Decreased caregiver support      Co-evaluation               End of Session   Activity Tolerance: Patient tolerated treatment well Patient left: in  bed;with call bell/phone within reach;with bed alarm set Nurse Communication: Mobility status PT Visit Diagnosis: Unsteadiness on feet (R26.81)    Time: 3567-0141 PT Time Calculation (min) (ACUTE ONLY): 16 min   Charges:   PT Evaluation $PT Eval Low Complexity: 1 Procedure     PT G Codes:        Nov 21, 2016  [name], PT [phone]  (pager)  [name] Nov 21, 2016, 4:29 PM

## 2016-10-28 NOTE — Consult Note (Signed)
[facility] Renal Consultation Note    Indication for Consultation:  Management of ESRD/hemodialysis; anemia, hypertension/volume and secondary hyperparathyroidism PCP: Tivis Ringer, MD  Referring phyisician: [facility]  HPI: [name] is a 66 y.o. female with ESRD secondary to bx proven diabetic nephropathy on HD since November 2014 with hx prior breast cancer, left eye removal,HTN who is poorly tolerant of large volume removal on dialysis and cramps often during HD.  BP tends to run high and she permits BP checks only with her own wrist BP cuff.  She presented to dialysis Monday with a higher than usual goal. (pre HD wt 97) She said she signed off early due to cramping and had a net UF 4.3 L with post HD wt of 92.8.  Records indicate she ran her full treatment.  She got on the [facility] bus after treatment but started having worse abdominal cramping (LLQ that radiated to flank) and came back into the lobby of the dialysis center where she was evaluated by staff and sent to the ED via EMS.  BP at that time was 216/104.  Evaluation in the ED showed normal WBC hgb 10.3 consistent with usual hgb glu 251 K 4.5.  CXR showed early interstitial edema.bowel gas pattern. U/A was unremarkable. Trop 0.03 BNP 2600.  EKG showed NSR. nitial O2 sats were low. She was admitted with acute respiratory failure with hypoxia and  left sided abdominal pain  No elevated temp was recorded at her outpt dialysis. She is afebrile here BP are in usual range. She reports chronic dysuria. No N, V, D, occ constipation and a fair amount of gas for which she takes Gas-EX that helps some.  She has some orthopnea. She is having some coughing and that makes her abdomen hurt. It also hurts when she comes to a sitting position in bed. Overall she feels a little better today.  Past Medical History:  Diagnosis Date  . Anemia   . Anxiety   . Arthritis    "joints" (06/15/2013)  . Blood transfusion without  reported diagnosis   . Breast cancer ([place])    left  . CHF (congestive heart failure) ([place])   . ESRD (end stage renal disease) ([place])    "suppose to start dialysis today" (06/15/2013)  . GERD (gastroesophageal reflux disease)   . Hypertension   . Myalgia 12/31/2011  . Neuropathy ([place]) 12/31/2011  . Shortness of breath    "lately it's been all the time" (06/15/2013)  . Type II diabetes mellitus ([place])    Past Surgical History:  Procedure Laterality Date  . ABDOMINAL HYSTERECTOMY     partial  . AV FISTULA PLACEMENT Right 06/06/2013   Procedure: ARTERIOVENOUS (AV) FISTULA CREATION-RIGHT BRACHIAL CEPHALIC;  Surgeon: Conrad Breathitt, MD;  Location: [place];  Service: Vascular;  Laterality: Right;  . BREAST BIOPSY Left   . BREAST LUMPECTOMY Left    "and took out some lymph nodes" (06/15/2013)  . CATARACT EXTRACTION W/ ANTERIOR VITRECTOMY Bilateral   . CESAREAN SECTION  1980  . EYE SURGERY Bilateral    laser surgery  . REFRACTIVE SURGERY Bilateral   . REMOVAL OF A DIALYSIS CATHETER Right 06/06/2013   Procedure: REMOVAL OF RIGHT MEDIPORT;  Surgeon: Conrad [place], MD;  Location: [place];  Service: Vascular;  Laterality: Right;  . TONSILLECTOMY     Family History  Problem Relation Age of Onset  . Diabetes Mother   . Hyperlipidemia Mother   . Hypertension Mother   .  Hypertension Father   . Diabetes Sister   . Diabetes Brother   . Hypertension Brother   . Heart attack Brother   . Kidney disease Brother   . Colon cancer Neg Hx   . Colon polyps Neg Hx   . Esophageal cancer Neg Hx   . Gallbladder disease Neg Hx    Social History:  reports that she has never smoked. She has never used smokeless tobacco. She reports that she does not drink alcohol or use drugs. Allergies  Allergen Reactions  . Oxycodone Other (See Comments)    Hallucinations   . Tramadol Other (See Comments)    hallucinations   . Latex Hives   Prior to Admission medications   Medication Sig Start Date End Date Taking?  Authorizing Provider  acetaminophen (TYLENOL) 500 MG tablet Take 1,000 mg by mouth every 4 (four) hours as needed for mild pain, moderate pain, fever or headache.   Yes Historical Provider, MD  albuterol (PROAIR HFA) 108 (90 Base) MCG/ACT inhaler Inhale 2 puffs into the lungs every 6 (six) hours as needed for wheezing or shortness of breath.   Yes Historical Provider, MD  aspirin 81 MG chewable tablet Chew 81 mg by mouth daily with lunch.    Yes Historical Provider, MD  B Complex-C-Folic Acid (RENA-VITE RX) 1 MG TABS Take 1 tablet by mouth daily with lunch.  09/29/16  Yes Historical Provider, MD  benzonatate (TESSALON PERLES) 100 MG capsule Take 1 capsule (100 mg total) by mouth 3 (three) times daily as needed for cough. 08/28/16  Yes Courteney Lyn Mackuen, MD  carvedilol (COREG) 12.5 MG tablet Take 12.5 mg by mouth 2 (two) times daily with a meal.   Yes Historical Provider, MD  cloNIDine (CATAPRES) 0.1 MG tablet Take 0.1 mg by mouth See admin instructions. Take 1 tablet (0.1 mg) twice daily  - with lunch and at bedtime   Yes Historical Provider, MD  cyclobenzaprine (FLEXERIL) 5 MG tablet Take 5 mg by mouth 3 (three) times daily as needed for muscle spasms.  08/18/16  Yes Historical Provider, MD  diazepam (VALIUM) 2 MG tablet Take 2 mg by mouth 2 (two) times daily as needed for muscle spasms.  09/28/16  Yes Historical Provider, MD  Docusate Calcium (STOOL SOFTENER PO) Take 1 capsule by mouth daily with lunch.    Yes Historical Provider, MD  fluticasone (FLONASE) 50 MCG/ACT nasal spray Place 1-2 sprays into both nostrils daily as needed for allergies or rhinitis.    Yes Historical Provider, MD  insulin aspart (NOVOLOG FLEXPEN) 100 UNIT/ML FlexPen Inject 10-16 Units into the skin 3 (three) times daily with meals. Sliding scale    Yes Historical Provider, MD  Insulin Detemir (LEVEMIR FLEXTOUCH) 100 UNIT/ML Pen Inject 12 Units into the skin daily with lunch.   Yes Historical Provider, MD  lanthanum (FOSRENOL)  1000 MG chewable tablet Chew 2,000 mg by mouth 3 (three) times daily with meals.    Yes Historical Provider, MD  lisinopril (PRINIVIL,ZESTRIL) 40 MG tablet Take 40 mg by mouth at bedtime.    Yes Historical Provider, MD  omeprazole (PRILOSEC OTC) 20 MG tablet Take 20 mg by mouth daily with lunch.    Yes Historical Provider, MD  pantoprazole (PROTONIX) 20 MG tablet Take 1 tablet (20 mg total) by mouth daily. Patient taking differently: Take 20 mg by mouth daily as needed for heartburn.  02/08/15  Yes [name], MD  Phenylephrine-DM-GG-APAP (MUCINEX FAST-MAX COLD FLU) 5-10-200-325 MG/10ML LIQD Take 7.5  mLs by mouth 2 (two) times daily as needed (cough).    Yes Historical Provider, MD  simethicone (GAS-X) 80 MG chewable tablet Chew 80 mg by mouth as needed for flatulence.   Yes Historical Provider, MD  darbepoetin (ARANESP) 200 MCG/0.4ML SOLN injection Inject 0.4 mLs (200 mcg total) into the vein every Wednesday with hemodialysis. 06/22/13   Geradine Girt, DO  doxercalciferol (HECTOROL) 4 MCG/2ML injection Inject 0.5 mLs (1 mcg total) into the vein every Monday, Wednesday, and Friday with hemodialysis. 06/18/13   Geradine Girt, DO   Current Facility-Administered Medications  Medication Dose Route Frequency Provider Last Rate Last Dose  . 0.9 %  sodium chloride infusion  250 mL Intravenous PRN Mariel Aloe, MD      . 0.9 %  sodium chloride infusion  100 mL Intravenous PRN Alric Seton, PA-C      . 0.9 %  sodium chloride infusion  100 mL Intravenous PRN Alric Seton, PA-C      . acetaminophen (TYLENOL) tablet 650 mg  650 mg Oral Q4H PRN Mariel Aloe, MD      . albuterol (PROVENTIL) (2.5 MG/3ML) 0.083% nebulizer solution 2.5 mg  2.5 mg Nebulization Q6H PRN Mariel Aloe, MD      . albuterol (PROVENTIL) (2.5 MG/3ML) 0.083% nebulizer solution 5 mg  5 mg Nebulization QID Theodis Blaze, MD      . alteplase (CATHFLO ACTIVASE) injection 2 mg  2 mg Intracatheter Once PRN Alric Seton, PA-C       . aspirin chewable tablet 81 mg  81 mg Oral Daily Mariel Aloe, MD      . benzonatate (TESSALON) capsule 100 mg  100 mg Oral TID PRN Mariel Aloe, MD      . carvedilol (COREG) tablet 12.5 mg  12.5 mg Oral BID WC Mariel Aloe, MD   12.5 mg at 10/27/16 2135  . cefTRIAXone (ROCEPHIN) 1 g in dextrose 5 % 50 mL IVPB  1 g Intravenous Q24H Mariel Aloe, MD   1 g at 10/27/16 2144  . cloNIDine (CATAPRES) tablet 0.1 mg  0.1 mg Oral 2 times per day Mariel Aloe, MD   0.1 mg at 10/27/16 2135  . [START ON 11/05/2016] Darbepoetin Alfa (ARANESP) injection 60 mcg  60 mcg Intravenous Q Wed-HD Alric Seton, PA-C      . docusate sodium (COLACE) capsule 100 mg  100 mg Oral Daily Mariel Aloe, MD      . docusate sodium (ENEMEEZ) enema 283 mg  1 enema Rectal Once Mariel Aloe, MD      . Derrill Memo ON 10/29/2016] doxercalciferol (HECTOROL) injection 3 mcg  3 mcg Intravenous Q M,W,F-HD Alric Seton, PA-C      . doxercalciferol (HECTOROL) injection 3 mcg  3 mcg Intravenous Once in dialysis Alric Seton, PA-C      . fentaNYL (SUBLIMAZE) injection 25 mcg  25 mcg Intravenous Q2H PRN Mariel Aloe, MD      . Derrill Memo ON 10/29/2016] ferric gluconate (NULECIT) 125 mg in sodium chloride 0.9 % 100 mL IVPB  125 mg Intravenous Q M,W,F-HD Alric Seton, PA-C      . fluticasone (FLONASE) 50 MCG/ACT nasal spray 1-2 spray  1-2 spray Each Nare Daily Mariel Aloe, MD      . furosemide (LASIX) injection 40 mg  40 mg Intravenous Daily Mariel Aloe, MD      . heparin injection 1,000 Units  1,000 Units Dialysis PRN  Alric Seton, PA-C      . heparin injection 1,900 Units  20 Units/kg Dialysis PRN Alric Seton, PA-C   1,900 Units at 10/28/16 0911  . heparin injection 5,000 Units  5,000 Units Subcutaneous Q8H Mariel Aloe, MD   5,000 Units at 10/28/16 [phone]  . insulin aspart (novoLOG) injection 0-9 Units  0-9 Units Subcutaneous TID WC Mariel Aloe, MD      . insulin detemir (LEVEMIR) injection 12 Units  12 Units  Subcutaneous Q lunch Mariel Aloe, MD      . lanthanum [facility]) chewable tablet 2,000 mg  2,000 mg Oral TID WC Mariel Aloe, MD      . lidocaine (PF) (XYLOCAINE) 1 % injection 5 mL  5 mL Intradermal PRN Alric Seton, PA-C      . lidocaine-prilocaine (EMLA) cream 1 application  1 application Topical PRN Alric Seton, PA-C      . lisinopril (PRINIVIL,ZESTRIL) tablet 40 mg  40 mg Oral QHS Mariel Aloe, MD   40 mg at 10/27/16 2135  . ondansetron (ZOFRAN) injection 4 mg  4 mg Intravenous Q6H PRN Mariel Aloe, MD      . pantoprazole (PROTONIX) EC tablet 20 mg  20 mg Oral Daily Mariel Aloe, MD      . pentafluoroprop-tetrafluoroeth [facility]) aerosol 1 application  1 application Topical PRN Alric Seton, PA-C      . predniSONE (DELTASONE) tablet 40 mg  40 mg Oral Q breakfast Mariel Aloe, MD      . sodium chloride flush (NS) 0.9 % injection 3 mL  3 mL Intravenous Q12H Mariel Aloe, MD   3 mL at 10/27/16 2139  . sodium chloride flush (NS) 0.9 % injection 3 mL  3 mL Intravenous PRN Mariel Aloe, MD       Labs: Basic Metabolic Panel:  Recent Labs Lab 10/27/16 1452 10/27/16 2025 10/28/16 0605  NA 142 139 137  K 3.5 4.5 4.7  CL 103 99* 97*  CO2 26 28 28   GLUCOSE 132* 251* 301*  BUN 14 19 26*  CREATININE 4.87* 5.95* 6.67*  CALCIUM 8.1* 9.0 8.8*  PHOS  --  4.6  --    Liver Function Tests:  Recent Labs Lab 10/27/16 1452 10/27/16 2025  AST 38  --   ALT 54  --   ALKPHOS 159*  --   BILITOT 1.1  --   PROT 6.3*  --   ALBUMIN 3.0* 3.4*    Recent Labs Lab 10/27/16 1452  LIPASE 17   CBC:  Recent Labs Lab 10/27/16 1452 10/27/16 2025 10/28/16 0605  WBC 7.0 6.1 7.1  NEUTROABS 4.6  --   --   HGB 9.5* 10.3* 9.7*  HCT 29.5* 32.4* 29.8*  MCV 90.2 90.5 89.5  PLT 237 270 249   CBG:  Recent Labs Lab 10/27/16 2124 10/28/16 0432  GLUCAP 312* 271*   Studies/Results: Dg Abd Acute W/chest  Result Date: 10/27/2016 CLINICAL DATA:  LEFT abdominal pain. EXAM: DG  ABDOMEN ACUTE W/ 1V CHEST COMPARISON:  CT abdomen and pelvis 08/28/2016. FINDINGS: Cardiomegaly. Possible early interstitial edema, with mildly prominent interstitial markings throughout both lung fields. No effusion. No definite consolidation. Nonobstructive gas pattern. Enteric contrast remains within the colon, and it is unclear if this is from the previous CT study or an interval administration of barium. No osseous findings. No visible calcifications. IMPRESSION: Cardiomegaly. Possible early interstitial edema without definite consolidation. Nonobstructive gas pattern. Enteric contrast throughout the  colon, query from prior CT or an additional barium study. Electronically Signed   By: Staci Righter M.D.   On: 10/27/2016 14:03    ROS: As per HPI otherwise negative.  Physical Exam: Vitals:   10/28/16 0800 10/28/16 0820 10/28/16 0830 10/28/16 0900  BP: (!) 150/79 (!) 162/82 (!) 154/81 (!) 158/88  Pulse: 78 73 73 73  Resp: 12 12 11 11   Temp: 98.1 F (36.7 C)     TempSrc: Oral     SpO2: 94%     Weight: 93.7 kg (206 lb 9.1 oz)     Height:         General: WDWN NAD AAF sats ok Head: NCAT MMM Neck: Supple.  Lungs: bibasilar crackles more so on the right. Breathing is unlabored. Heart: RRR with S1 S2.  Abdomen: obese soft NT + BS Lower extremities: + +LE edema no open wounds  Neuro: A & O  X 3. Moves all extremities spontaneously. Psych:  Responds to questions appropriately with a normal affect. Dialysis Access: AVF right upper arm   Dialysis Orders:  MWF [place] 4 hr 400/800 heparin 2500 2 K 2.25 Ca right upper AVF EDW 91.5 Profile 4 venofer through 4/11 Mircera 60 last 4/4 hectorol 3 Recent labs: hgb 10.2 4/4 20% sat ferritin 1100 iPTH 152 Ca/P ok   Assessment/Plan: 1. Acute respiratory failure due to volume excess - ^ volume on on exam; contributing to orthopnea, LE edema, cough and elevated BP. She is poorly tolerant of large volume removal - on HD today for extra tmt for  ultrafiltration and again on Wed to lower EDW for discharge; Echo ordered 2. Abdominal pain - unclear source - per primary 3. ESRD -  MWF - off schedule - plan HD Wed to get back on schedule; access issue - planned f'gram 4/17 for low AF- d/c daily BMP - only needs with dialysis; d/c lasix 4. Hypertension/volume  - goal today 3.5 L - HD again tomorrow - titrate EDW down while here - she has been resistant to lowering EDW in outpt setting due to cramping. Set for 3. 5. Anemia  - hgb 9.7 - usual range - redose Mircera due next week - continue Fe 6. Metabolic bone disease -  hectorol and fosrenol 7. Nutrition - alb 3.4 renal carb mod diet/vits 8. DM - per primary  Myriam Jacobson, PA-C [place] [phone] 10/28/2016, 9:48 AM  I have seen and examined this patient and agree with plan per Amalia Hailey.  66yo BF who developed abd pain post HD yest.  Pain gone, ? Due to fluid removal.  I think she needs a lower DW and will pull fluid today with HD and again tomorrow.  She still has contrast from CT abd 1 month ago so bowels not moving very much. Cathy Mccammon T,MD 10/28/2016 11:52 AM

## 2016-10-28 NOTE — Procedures (Signed)
Pt seen on HD.  Ap 110 Vp 230.  BFR 400.  + edema.  Few basilar crackles.  Wil pull 3.5L and plan HD again tomorrow

## 2016-10-28 NOTE — Progress Notes (Signed)
Patient ID: [name], female   DOB: 1950-10-13, 66 y.o.   MRN: [id number]    PROGRESS NOTE   [name]  ERD:[id number] DOB: May 23, 1951 DOA: 10/27/2016  PCP: Tivis Ringer, MD   Brief Narrative:  66 y.o. female with medical history significant of ESRD on HD MWF, Diabetes mellitus, combined CHF, hypertension, breast cancer, was on HD Monday and started to experience left sided abd pain and worsening dyspnea. Pain was mostly in LLQ area and occasionally radiating to the flank area. She has been seen for this in the past two months and has been treated with cough suppressants, vialium and antibiotics. She received antibiotics about two weeks ago for her coughing. CT scan in February was unremarkable for etiology.  Assessment & Plan: Acute respiratory failure with hypoxia - appears to be volume overload but unclear if ? Underlying COPD, pt is wheezing this AM and has been given solumedrol in ED - currently on Prednisone 40 mg PO QD, will continue this for now - HD today per nephrology team to help with volume  - ECHO also requested and pending at this time  - will ask for CT chest for clearer evaluation   Acute on chronic combined systolic and diastolic CHF - no recent ECHO on file, ECHO on this admit pending  - continue lisinopril - continue carvedilol - daily weights/in and outs  Left sided abdominal pain  Abnormal x-ray/constipation/impaction - place on bowel regimen - less abd pain this AM   Dysuria - Patient makes small amounts of urine and has symptoms with each void - urinalysis unremarkable, pt was placed on rocephin - she currently has no fevers, WBC is WNL - plan d/c Rocephin if urine culture negative   Cough - Wheezing on exam. No history of COPD. No smoking history. - Continue Tessalon - Continue Albuterol prn - continue prednisone - CT chest requested for clearer evaluation   Diabetes mellitus, type 2 with complications of nephropathy  - Last  A1C in our system in 2012 of 10.3%. -SSI - continue Levemir - check A1C  Anemia - Secondary to ESRD - management per nephrology  ESRD - On dialysis. MWF. Dr. Marval Regal follows as outpatient - nephrology consulted  Essential hypertension - Uncontrolled. Fluid status likely contributing. - continue carvedilol, clonidine, lisinopril  - add hydralazine as needed   GERD - continue Protonix  Obesity  - Body mass index is 33.34 kg/m.  DVT prophylaxis: Heparin SQ Code Status: Full  Family Communication: Patient at bedside  Disposition Plan: to be determined, possibly in 1-2 days   Consultants:   Nephrology   Procedures:   HD  Antimicrobials:   Rocephin 4/9 -->  Subjective: Reports more dyspnea and wheezing this AM.   Objective: Vitals:   10/28/16 1030 10/28/16 1100 10/28/16 1130 10/28/16 1200  BP: (!) 182/87 (!) 160/71 (!) 175/90 (!) 171/92  Pulse: 79 70 74 74  Resp: (!) 21 17 15  (!) 25  Temp:      TempSrc:      SpO2:      Weight:      Height:        Intake/Output Summary (Last 24 hours) at 10/28/16 1440 Last data filed at 10/28/16 0630  Gross per 24 hour  Intake              170 ml  Output                0 ml  Net  170 ml   Filed Weights   10/27/16 1226 10/27/16 2120 10/28/16 0800  Weight: 93.9 kg (207 lb) 90.3 kg (199 lb) 93.7 kg (206 lb 9.1 oz)   Examination:  General exam: Appears calm and comfortable  Respiratory system: Exp wheezing, diminished breaths sounds bilaterally  Cardiovascular system: S1 & S2 heard, RRR. + JVD, no rubs, gallops or clicks. +1-2 LE edema. Gastrointestinal system: Abdomen is nondistended, soft and nontender. No organomegaly or masses felt.  Central nervous system: Alert and oriented. No focal neurological deficits. Extremities: Symmetric 5 x 5 power.  Data Reviewed: I have personally reviewed following labs and imaging studies  CBC:  Recent Labs Lab 10/27/16 1452 10/27/16 2025 10/28/16 0605    WBC 7.0 6.1 7.1  NEUTROABS 4.6  --   --   HGB 9.5* 10.3* 9.7*  HCT 29.5* 32.4* 29.8*  MCV 90.2 90.5 89.5  PLT 237 270 485   Basic Metabolic Panel:  Recent Labs Lab 10/27/16 1452 10/27/16 2025 10/28/16 0605  NA 142 139 137  K 3.5 4.5 4.7  CL 103 99* 97*  CO2 26 28 28   GLUCOSE 132* 251* 301*  BUN 14 19 26*  CREATININE 4.87* 5.95* 6.67*  CALCIUM 8.1* 9.0 8.8*  PHOS  --  4.6  --    Liver Function Tests:  Recent Labs Lab 10/27/16 1452 10/27/16 2025  AST 38  --   ALT 54  --   ALKPHOS 159*  --   BILITOT 1.1  --   PROT 6.3*  --   ALBUMIN 3.0* 3.4*    Recent Labs Lab 10/27/16 1452  LIPASE 17   CBG:  Recent Labs Lab 10/27/16 2124 10/28/16 0432 10/28/16 1312  GLUCAP 312* 271* 134*   Urine analysis:    Component Value Date/Time   COLORURINE YELLOW 10/27/2016 1943   APPEARANCEUR CLEAR 10/27/2016 1943   LABSPEC 1.012 10/27/2016 1943   LABSPEC 1.025 02/13/2011 1533   PHURINE 8.0 10/27/2016 1943   GLUCOSEU 150 (A) 10/27/2016 1943   HGBUR NEGATIVE 10/27/2016 1943   BILIRUBINUR NEGATIVE 10/27/2016 1943   BILIRUBINUR Negative 02/13/2011 1533   KETONESUR NEGATIVE 10/27/2016 1943   PROTEINUR 100 (A) 10/27/2016 1943   UROBILINOGEN 0.2 04/28/2011 1045   NITRITE NEGATIVE 10/27/2016 1943   LEUKOCYTESUR NEGATIVE 10/27/2016 1943   LEUKOCYTESUR Negative 02/13/2011 1533   Recent Results (from the past 240 hour(s))  MRSA PCR Screening     Status: None   Collection Time: 10/27/16 11:28 PM  Result Value Ref Range Status   MRSA by PCR NEGATIVE NEGATIVE Final    Comment:        The GeneXpert MRSA Assay (FDA approved for NASAL specimens only), is one component of a comprehensive MRSA colonization surveillance program. It is not intended to diagnose MRSA infection nor to guide or monitor treatment for MRSA infections.     Radiology Studies: Dg Abd Acute W/chest  Result Date: 10/27/2016 CLINICAL DATA:  LEFT abdominal pain. EXAM: DG ABDOMEN ACUTE W/ 1V CHEST  COMPARISON:  CT abdomen and pelvis 08/28/2016. FINDINGS: Cardiomegaly. Possible early interstitial edema, with mildly prominent interstitial markings throughout both lung fields. No effusion. No definite consolidation. Nonobstructive gas pattern. Enteric contrast remains within the colon, and it is unclear if this is from the previous CT study or an interval administration of barium. No osseous findings. No visible calcifications. IMPRESSION: Cardiomegaly. Possible early interstitial edema without definite consolidation. Nonobstructive gas pattern. Enteric contrast throughout the colon, query from prior CT or an additional barium  study. Electronically Signed   By: Staci Righter M.D.   On: 10/27/2016 14:03    Scheduled Meds: . aspirin  81 mg Oral Daily  . carvedilol  12.5 mg Oral BID WC  . cefTRIAXone (ROCEPHIN)  IV  1 g Intravenous Q24H  . cloNIDine  0.1 mg Oral 2 times per day  . [START ON 11/05/2016] darbepoetin (ARANESP) injection - DIALYSIS  60 mcg Intravenous Q Wed-HD  . docusate sodium  100 mg Oral Daily  . docusate sodium  1 enema Rectal Once  . [START ON 10/29/2016] doxercalciferol  3 mcg Intravenous Q M,W,F-HD  . [START ON 10/29/2016] ferric gluconate (FERRLECIT/NULECIT) IV  125 mg Intravenous Q M,W,F-HD  . fluticasone  1-2 spray Each Nare Daily  . heparin  5,000 Units Subcutaneous Q8H  . insulin aspart  0-9 Units Subcutaneous TID WC  . insulin detemir  12 Units Subcutaneous Q lunch  . lanthanum  2,000 mg Oral TID WC  . lisinopril  40 mg Oral QHS  . multivitamin  1 tablet Oral QHS  . pantoprazole  20 mg Oral Daily  . predniSONE  40 mg Oral Q breakfast  . sodium chloride flush  3 mL Intravenous Q12H   Continuous Infusions:   LOS: 1 day   Time spent: 20 minutes   Faye Ramsay, MD [facility] Pager [phone]  If 7PM-7AM, please contact night-coverage [email or web address] Password [email or web address] 10/28/2016, 2:40 PM

## 2016-10-28 NOTE — Consult Note (Signed)
           [facility] CM Primary Care Navigator  10/28/2016  [name] 1950-08-08 [id number]   Went to seepatient at the bedside to identify possible discharge needs but staff reports that patient is currently in hemodialysis.  Will try to see patient at another time when she is available in room.  For questions, please contact:  Dannielle Huh, BSN, RN- [facility] Primary Care Navigator  Telephone: [phone] [place]

## 2016-10-29 ENCOUNTER — Other Ambulatory Visit (HOSPITAL_COMMUNITY): Payer: Medicare Other

## 2016-10-29 LAB — CBC
HEMATOCRIT: 30.1 % — AB (ref 36.0–46.0)
HEMOGLOBIN: 9.8 g/dL — AB (ref 12.0–15.0)
MCH: 29 pg (ref 26.0–34.0)
MCHC: 32.6 g/dL (ref 30.0–36.0)
MCV: 89.1 fL (ref 78.0–100.0)
Platelets: 239 10*3/uL (ref 150–400)
RBC: 3.38 MIL/uL — ABNORMAL LOW (ref 3.87–5.11)
RDW: 18.5 % — ABNORMAL HIGH (ref 11.5–15.5)
WBC: 9 10*3/uL (ref 4.0–10.5)

## 2016-10-29 LAB — GLUCOSE, CAPILLARY
GLUCOSE-CAPILLARY: 301 mg/dL — AB (ref 65–99)
GLUCOSE-CAPILLARY: 170 mg/dL — AB (ref 65–99)
Glucose-Capillary: 247 mg/dL — ABNORMAL HIGH (ref 65–99)
Glucose-Capillary: 190 mg/dL — ABNORMAL HIGH (ref 65–99)
Glucose-Capillary: 193 mg/dL — ABNORMAL HIGH (ref 65–99)

## 2016-10-29 LAB — RENAL FUNCTION PANEL
ALBUMIN: 3.1 g/dL — AB (ref 3.5–5.0)
Anion gap: 12 (ref 5–15)
BUN: 24 mg/dL — AB (ref 6–20)
CALCIUM: 8.9 mg/dL (ref 8.9–10.3)
CO2: 27 mmol/L (ref 22–32)
Chloride: 95 mmol/L — ABNORMAL LOW (ref 101–111)
Creatinine, Ser: 5.35 mg/dL — ABNORMAL HIGH (ref 0.44–1.00)
GFR calc Af Amer: 9 mL/min — ABNORMAL LOW (ref >60)
GFR calc non Af Amer: 8 mL/min — ABNORMAL LOW (ref >60)
GLUCOSE: 268 mg/dL — AB (ref 65–99)
Phosphorus: 4 mg/dL (ref 2.5–4.6)
Potassium: 4.3 mmol/L (ref 3.5–5.1)
Sodium: 134 mmol/L — ABNORMAL LOW (ref 135–145)

## 2016-10-29 LAB — URINE CULTURE

## 2016-10-29 MED ORDER — PREDNISONE 10 MG PO TABS: 40.0000 mg | ORAL_TABLET | Freq: Every day | ORAL | 0 refills | Status: DC

## 2016-10-29 MED ORDER — DOXYCYCLINE HYCLATE 100 MG PO TABS: 100.0000 mg | ORAL_TABLET | Freq: Two times a day (BID) | ORAL | 0 refills | Status: DC

## 2016-10-29 MED ORDER — LIVING BETTER WITH HEART FAILURE BOOK
Freq: Once | Status: AC
  Administered 2016-10-30: 07:00:00

## 2016-10-29 MED ORDER — BISACODYL 10 MG RE SUPP
10.0000 mg | Freq: Once | RECTAL | Status: AC
  Administered 2016-10-29: 10 mg via RECTAL
  Filled 2016-10-29: qty 1

## 2016-10-29 NOTE — Discharge Summary (Signed)
Physician Discharge Summary  [name] UYQ:[id number] DOB: [birth year] DOA: 10/27/2016  PCP: Tivis Ringer, MD  Admit date: 10/27/2016 Discharge date: 10/29/2016  Recommendations for Outpatient Follow-up:  1. Pt will need to follow up with PCP in 1-2 weeks post discharge 2. Please obtain BMP to evaluate electrolytes and kidney function 3. Please also check CBC to evaluate Hg and Hct levels  Discharge Diagnoses:  Principal Problem:   Acute respiratory failure with hypoxia (HCC) Active Problems:   CHF (congestive heart failure) (HCC)   Diabetes ([place])   End stage renal disease (HCC)   Essential hypertension   GERD (gastroesophageal reflux disease)  Discharge Condition: Stable  Diet recommendation: Heart healthy diet discussed in details   Brief Narrative:  66 y.o.femalewith medical history significant of ESRD on HD MWF, Diabetes mellitus, combined CHF, hypertension, breast cancer, was on HD Monday and started to experience left sided abd pain and worsening dyspnea. Pain was mostly in LLQ area and occasionally radiating to the flank area. She has been seen for this in the past two months and has been treated with cough suppressants, vialium and antibiotics. She received antibiotics about two weeks ago for her coughing. CT scan in February was unremarkable for etiology.  Assessment & Plan: Acute respiratory failure with hypoxia - appears to be volume overload but unclear if ? Underlying COPD, pt is wheezing this AM and has been given solumedrol in ED - currently on Prednisone 40 mg PO QD, will continue with taper upon discharge  - HD today per nephrology team and with possible d/c after session  - ECHO also requested and pending at this time   Acute on chronic combined systolic and diastolic CHF - no recent ECHO on file, ECHO on this admit pending  - continue lisinopril - continue carvedilol - daily weights/in and outs  Left sided abdominal pain  Abnormal  x-ray/constipation/impaction - placed on bowel regimen - less abd pain this AM   Dysuria - Patient makes small amounts of urine and has symptoms with each void - urinalysis unremarkable, pt was placed on rocephin - she currently has no fevers, WBC is WNL - change to PO ABX on discharge   Cough - Wheezing on exam. No history of COPD. No smoking history. - Continue Tessalon - Continue Albuterol prn - continue prednisone taper   Diabetes mellitus, type 2 with complications of nephropathy  - Last A1C in our system in 2012 of 10.3%. - SSI - continue Levemir  Anemia - Secondary to ESRD - management per nephrology  ESRD - On dialysis. MWF. Dr. Marval Regal follows as outpatient  Essential hypertension - Uncontrolled. Fluid status likely contributing. - continue carvedilol, clonidine, lisinopril   GERD - continue Protonix  Obesity  - Body mass index is 33.34 kg/m.  DVT prophylaxis: Heparin SQ Code Status: Full  Family Communication: Patient at bedside  Disposition Plan: possibly home today   Consultants:   Nephrology   Procedures:   HD  Antimicrobials:   Rocephin 4/9 --> 4/11  Doxycycline 4/11 -->   Procedures/Studies: Ct Chest Wo Contrast  Result Date: 10/28/2016 CLINICAL DATA:  Acute onset of wheezing. Subacute onset of dyspnea and cough. Initial encounter. EXAM: CT CHEST WITHOUT CONTRAST TECHNIQUE: Multidetector CT imaging of the chest was performed following the standard protocol without IV contrast. COMPARISON:  Chest radiograph performed 10/27/2016, and CTA of the chest performed 10/19/2010 FINDINGS: Cardiovascular: The heart is mildly enlarged. Calcification at the aortic and mitral valves. Scattered coronary artery calcifications are  seen. Scattered calcification is seen along the thoracic aorta and proximal great vessels. The vasculature is not well assessed without contrast. Mediastinum/Nodes: The mediastinum is otherwise grossly unremarkable.  No definite mediastinal lymphadenopathy is seen. No pericardial effusion is identified. Hypodensities within the thyroid gland measure up to 1.4 cm, likely within normal limits given their size. No axillary lymphadenopathy is seen. Calcifications are seen at the left breast. Lungs/Pleura: Minimal bilateral peribronchovascular opacities likely reflect atelectasis. No pleural effusion or pneumothorax is seen. No masses are identified. Upper Abdomen: The visualized portions of the liver and spleen are grossly unremarkable. The visualized portions of the pancreas, adrenal glands and kidneys are within normal limits. Musculoskeletal: No acute osseous abnormalities are identified. The visualized musculature is unremarkable in appearance. IMPRESSION: 1. Minimal bilateral peribronchovascular opacities likely reflect atelectasis. Lungs otherwise clear. 2. Mild cardiomegaly. Calcification at the aortic and mitral valves. 3. Scattered coronary artery calcifications seen. Electronically Signed   By: Garald Balding M.D.   On: 10/28/2016 23:22   Dg Abd Acute W/chest  Result Date: 10/27/2016 CLINICAL DATA:  LEFT abdominal pain. EXAM: DG ABDOMEN ACUTE W/ 1V CHEST COMPARISON:  CT abdomen and pelvis 08/28/2016. FINDINGS: Cardiomegaly. Possible early interstitial edema, with mildly prominent interstitial markings throughout both lung fields. No effusion. No definite consolidation. Nonobstructive gas pattern. Enteric contrast remains within the colon, and it is unclear if this is from the previous CT study or an interval administration of barium. No osseous findings. No visible calcifications. IMPRESSION: Cardiomegaly. Possible early interstitial edema without definite consolidation. Nonobstructive gas pattern. Enteric contrast throughout the colon, query from prior CT or an additional barium study. Electronically Signed   By: Staci Righter M.D.   On: 10/27/2016 14:03     Discharge Exam: Vitals:   10/29/16 0544 10/29/16 0949   BP: (!) 153/75 (!) 152/52  Pulse: 71 80  Resp: 18 18  Temp: 98.1 F (36.7 C) 98.4 F (36.9 C)   Vitals:   10/28/16 2300 10/29/16 0320 10/29/16 0544 10/29/16 0949  BP: (!) 158/72  (!) 153/75 (!) 152/52  Pulse: 74  71 80  Resp: 18  18 18   Temp: 97.8 F (36.6 C)  98.1 F (36.7 C) 98.4 F (36.9 C)  TempSrc: Oral  Oral Oral  SpO2: 99%  98% 96%  Weight: 90.6 kg (199 lb 11.8 oz) 90.6 kg (199 lb 11.8 oz)    Height:        General: Pt is alert, follows commands appropriately, not in acute distress Cardiovascular: Regular rate and rhythm, S1/S2 +, no murmurs, no rubs, no gallops Respiratory: Clear to auscultation bilaterally, no wheezing, no crackles, no rhonchi Abdominal: Soft, non tender, non distended, bowel sounds +, no guarding   Discharge Instructions   Allergies as of 10/29/2016      Reactions   Oxycodone Other (See Comments)   Hallucinations    Tramadol Other (See Comments)   hallucinations    Latex Hives      Medication List    TAKE these medications   acetaminophen 500 MG tablet Commonly known as:  TYLENOL Take 1,000 mg by mouth every 4 (four) hours as needed for mild pain, moderate pain, fever or headache.   aspirin 81 MG chewable tablet Chew 81 mg by mouth daily with lunch.   benzonatate 100 MG capsule Commonly known as:  TESSALON PERLES Take 1 capsule (100 mg total) by mouth 3 (three) times daily as needed for cough.   carvedilol 12.5 MG tablet Commonly known as:  COREG Take 12.5 mg by mouth 2 (two) times daily with a meal.   cloNIDine 0.1 MG tablet Commonly known as:  CATAPRES Take 0.1 mg by mouth See admin instructions. Take 1 tablet (0.1 mg) twice daily  - with lunch and at bedtime   cyclobenzaprine 5 MG tablet Commonly known as:  FLEXERIL Take 5 mg by mouth 3 (three) times daily as needed for muscle spasms.   darbepoetin 200 MCG/0.4ML Soln injection Commonly known as:  ARANESP Inject 0.4 mLs (200 mcg total) into the vein every Wednesday with  hemodialysis.   diazepam 2 MG tablet Commonly known as:  VALIUM Take 2 mg by mouth 2 (two) times daily as needed for muscle spasms.   doxercalciferol 4 MCG/2ML injection Commonly known as:  HECTOROL Inject 0.5 mLs (1 mcg total) into the vein every Monday, Wednesday, and Friday with hemodialysis.   doxycycline 100 MG tablet Commonly known as:  VIBRA-TABS Take 1 tablet (100 mg total) by mouth 2 (two) times daily.   fluticasone 50 MCG/ACT nasal spray Commonly known as:  FLONASE Place 1-2 sprays into both nostrils daily as needed for allergies or rhinitis.   GAS-X 80 MG chewable tablet Generic drug:  simethicone Chew 80 mg by mouth as needed for flatulence.   lanthanum 1000 MG chewable tablet Commonly known as:  FOSRENOL Chew 2,000 mg by mouth 3 (three) times daily with meals.   LEVEMIR FLEXTOUCH 100 UNIT/ML Pen Generic drug:  Insulin Detemir Inject 12 Units into the skin daily with lunch.   lisinopril 40 MG tablet Commonly known as:  PRINIVIL,ZESTRIL Take 40 mg by mouth at bedtime.   Egan FAST-MAX COLD FLU 5-10-200-325 MG/10ML Liqd Generic drug:  Phenylephrine-DM-GG-APAP Take 7.5 mLs by mouth 2 (two) times daily as needed (cough).   NOVOLOG FLEXPEN 100 UNIT/ML FlexPen Generic drug:  insulin aspart Inject 10-16 Units into the skin 3 (three) times daily with meals. Sliding scale   omeprazole 20 MG tablet Commonly known as:  PRILOSEC OTC Take 20 mg by mouth daily with lunch.   pantoprazole 20 MG tablet Commonly known as:  PROTONIX Take 1 tablet (20 mg total) by mouth daily. What changed:  when to take this  reasons to take this   predniSONE 10 MG tablet Commonly known as:  DELTASONE Take 4 tablets (40 mg total) by mouth daily with breakfast. Take 40 tablet on 4/12 and taper down by 10 mg daily until completed Start taking on:  10/30/2016   PROAIR HFA 108 (90 Base) MCG/ACT inhaler Generic drug:  albuterol Inhale 2 puffs into the lungs every 6 (six) hours as  needed for wheezing or shortness of breath.   RENA-VITE RX 1 MG Tabs Take 1 tablet by mouth daily with lunch.   STOOL SOFTENER PO Take 1 capsule by mouth daily with lunch.      Follow-up Information    Tivis Ringer, MD Follow up.   Specialty:  Internal Medicine Contact information: [place] [address] [phone]            The results of significant diagnostics from this hospitalization (including imaging, microbiology, ancillary and laboratory) are listed below for reference.     Microbiology: Recent Results (from the past 240 hour(s))  MRSA PCR Screening     Status: None   Collection Time: 10/27/16 11:28 PM  Result Value Ref Range Status   MRSA by PCR NEGATIVE NEGATIVE Final    Comment:        The GeneXpert MRSA  Assay (FDA approved for NASAL specimens only), is one component of a comprehensive MRSA colonization surveillance program. It is not intended to diagnose MRSA infection nor to guide or monitor treatment for MRSA infections.   Culture, Urine     Status: Abnormal   Collection Time: 10/28/16  7:35 AM  Result Value Ref Range Status   Specimen Description URINE, RANDOM  Final   Special Requests NONE  Final   Culture MULTIPLE SPECIES PRESENT, SUGGEST RECOLLECTION (A)  Final   Report Status 10/29/2016 FINAL  Final     Labs: Basic Metabolic Panel:  Recent Labs Lab 10/27/16 1452 10/27/16 2025 10/28/16 0605 10/28/16 2209 10/29/16 0429  NA 142 139 137 132* 134*  K 3.5 4.5 4.7 4.4 4.3  CL 103 99* 97* 94* 95*  CO2 26 28 28 27 27   GLUCOSE 132* 251* 301* 392* 268*  BUN 14 19 26* 19 24*  CREATININE 4.87* 5.95* 6.67* 4.88* 5.35*  CALCIUM 8.1* 9.0 8.8* 8.7* 8.9  PHOS  --  4.6  --  4.0 4.0   Liver Function Tests:  Recent Labs Lab 10/27/16 1452 10/27/16 2025 10/28/16 2209 10/29/16 0429  AST 38  --   --   --   ALT 54  --   --   --   ALKPHOS 159*  --   --   --   BILITOT 1.1  --   --   --   PROT 6.3*  --   --   --    ALBUMIN 3.0* 3.4* 3.1* 3.1*    Recent Labs Lab 10/27/16 1452  LIPASE 17   No results for input(s): AMMONIA in the last 168 hours. CBC:  Recent Labs Lab 10/27/16 1452 10/27/16 2025 10/28/16 0605 10/28/16 2209 10/29/16 0429  WBC 7.0 6.1 7.1 8.5 9.0  NEUTROABS 4.6  --   --   --   --   HGB 9.5* 10.3* 9.7* 9.8* 9.8*  HCT 29.5* 32.4* 29.8* 30.1* 30.1*  MCV 90.2 90.5 89.5 89.1 89.1  PLT 237 270 249 250 239   Cardiac Enzymes: No results for input(s): CKTOTAL, CKMB, CKMBINDEX, TROPONINI in the last 168 hours. BNP: BNP (last 3 results)  Recent Labs  10/27/16 1452  BNP 2,605.8*   CBG:  Recent Labs Lab 10/28/16 1706 10/28/16 2311 10/29/16 0214 10/29/16 0541 10/29/16 0747  GLUCAP 239* 344* 301* 247* 190*   SIGNED: Time coordinating discharge: 30 minutes  Faye Ramsay, MD  [facility] 10/29/2016, 11:08 AM Pager [phone]  If 7PM-7AM, please contact night-coverage [email or web address] Password [email or web address]

## 2016-10-29 NOTE — Progress Notes (Signed)
Physical Therapy Treatment Patient Details Name: [name] MRN: [id number] DOB: 1950-10-09 Today's Date: 10/29/2016    History of Present Illness 66 y.o. female with medical history significant of ESRD on HD MWF, Diabetes mellitus, combined CHF, hypertension, breast cancer, was on HD Monday and started to experience left sided abd pain and worsening dyspnea.    PT Comments    Pt still a little unsteady, rates some mild instability in several aspects of the DGI, but will be safe enough for discharge to home alone, manage getting the HD and has family to assist as needed if she calls. Will sign off from acute PT at this time.   Follow Up Recommendations  No PT follow up     Equipment Recommendations  None recommended by PT    Recommendations for Other Services       Precautions / Restrictions Precautions Precautions: Fall    Mobility  Bed Mobility Overal bed mobility: Modified Independent                Transfers Overall transfer level: Modified independent                  Ambulation/Gait Ambulation/Gait assistance: Modified independent (Device/Increase time);Supervision (mod I in home environment, initial mild instability) Ambulation Distance (Feet): 300 Feet Assistive device: None Gait Pattern/deviations: Step-through pattern Gait velocity: pt does not have ability to speed up to community level of mobility Gait velocity interpretation: at or above normal speed for age/gender General Gait Details: see dgi--initial scissoring to steady herself with improvement as she warmed up.   Stairs Stairs: Yes   Stair Management: One rail Right;Two rails;Step to pattern;Forwards Number of Stairs: 3 General stair comments: safe with rail/s  Wheelchair Mobility    Modified Rankin (Stroke Patients Only)       Balance Overall balance assessment: Needs assistance   Sitting balance-Leahy Scale: Good     Standing balance support: No upper extremity  supported Standing balance-Leahy Scale: Fair                   Standardized Balance Assessment Standardized Balance Assessment : Dynamic Gait Index   Dynamic Gait Index Level Surface: Mild Impairment Change in Gait Speed: Mild Impairment Gait with Horizontal Head Turns: Normal Gait with Vertical Head Turns: Normal Gait and Pivot Turn: Mild Impairment Step Over Obstacle: Mild Impairment Step Around Obstacles: Normal Steps: Mild Impairment Total Score: 19      Cognition Arousal/Alertness: Awake/alert Behavior During Therapy: WFL for tasks assessed/performed Overall Cognitive Status: Within Functional Limits for tasks assessed                                        Exercises      General Comments        Pertinent Vitals/Pain Pain Assessment: No/denies pain    Home Living                      Prior Function            PT Goals (current goals can now be found in the care plan section) Acute Rehab PT Goals PT Goal Formulation: With patient Progress towards PT goals: Progressing toward goals    Frequency           PT Plan Current plan remains appropriate    Co-evaluation  End of Session   Activity Tolerance: Patient tolerated treatment well Patient left: in bed;with call bell/phone within reach;with bed alarm set Nurse Communication: Mobility status PT Visit Diagnosis: Unsteadiness on feet (R26.81)     Time: 3672-5500 PT Time Calculation (min) (ACUTE ONLY): 10 min  Charges:  $Gait Training: 8-22 mins                    G Codes:       2016/11/13  [name], PT [phone]  (pager)   [name] November 13, 2016, 12:38 PM

## 2016-10-29 NOTE — Discharge Instructions (Signed)

## 2016-10-29 NOTE — Progress Notes (Signed)
Patient came back from HD with cramping, weakness and vomiting.  Per HD nurse BP dropped to 90's but came back into 140s.  Patient said she was not feeling good, paged Dr. Doyle Askew, ordered to discontinue discharge and wait till morning.  Gave Zofran, and notified oncoming nurse about the situation.  Will continue to monitor.

## 2016-10-29 NOTE — Progress Notes (Signed)
[facility] Progress Note  Dialysis Orders:  MWF [facility] 4 hr 400/800 heparin 2500 2 K 2.25 Ca right upper AVF EDW 91.5 Profile 4 venofer through 4/11 Mircera 60 last 4/4 hectorol 3 Recent labs: hgb 10.2 4/4 20% sat ferritin 1100 iPTH 152 Ca/P ok   Assessment/Plan: 1. Acute respiratory failure due to volume excess - ^ volume contributing to orthopnea, LE edema, cough and elevated BP. She is poorly tolerant of large volume removal - had HD Tuesday  for extra tmt for ultrafiltration and again today  to lower EDW for discharge; Echo ordered 2. Abdominal pain - unclear source may just be related to coughing 3. ESRD -  MWF - K 4.3-HD today  on schedule; access issue - planned [facility]'gram [facility] for low AF- d/c daily BMP - only needs with dialysis; d/c lasix- cramped with tmt yesterday. Will not give valium with dialysis. 4. Hypertension/volume  -HD again today on schedule - titrate EDW down while here - she has been resistant to lowering EDW in outpt setting due to cramping. Set for 3L on Tuesday - net UF was 2738 with post wt 90.6.Marland Kitchen  HD again today to titrate volume/BP down 5. Anemia  - hgb 9.8 - usual range - redose Mircera due next week - continue Fe 6. Metabolic bone disease -  hectorol and fosrenol- controlled 7. Nutrition - alb 3.1 renal carb mod diet/vits 8. DM - per primary BS ^  Myriam Jacobson, PA-C [place] 10/29/2016,10:25 AM  LOS: 2 days  I have seen and examined this patient and agree with plan per Amalia Hailey.  No further abd pain.  + edema.  Plan HD today then could be DC. Sanora Cunanan T,MD 10/29/2016 10:47 AM Subjective:   Feels better.  Coughing causes LLQ pain. Breathing better. Legs still swollen. Said " the last time I was in here they gave me valium so I could stay on the machine without cramping."  Objective Vitals:   10/28/16 2300 10/29/16 0320 10/29/16 0544 10/29/16 0949  BP: (!) 158/72  (!) 153/75 (!) 152/52   Pulse: 74  71 80  Resp: 18  18 18   Temp: 97.8 F (36.6 C)  98.1 F (36.7 C) 98.4 F (36.9 C)  TempSrc: Oral  Oral Oral  SpO2: 99%  98% 96%  Weight: 90.6 kg (199 lb 11.8 oz) 90.6 kg (199 lb 11.8 oz)    Height:       Physical Exam General: NAD sats ok on room air-looks better Heart: RRR Lungs: crackles on right Abdomen: soft NT Extremities: 2+ pitting edema lower legs Dialysis Access: right AVF + bruit  Additional Objective Labs: Basic Metabolic Panel:  Recent Labs Lab 10/27/16 2025 10/28/16 0605 10/28/16 2209 10/29/16 0429  NA 139 137 132* 134*  K 4.5 4.7 4.4 4.3  CL 99* 97* 94* 95*  CO2 28 28 27 27   GLUCOSE 251* 301* 392* 268*  BUN 19 26* 19 24*  CREATININE 5.95* 6.67* 4.88* 5.35*  CALCIUM 9.0 8.8* 8.7* 8.9  PHOS 4.6  --  4.0 4.0   Liver Function Tests:  Recent Labs Lab 10/27/16 1452 10/27/16 2025 10/28/16 2209 10/29/16 0429  AST 38  --   --   --   ALT 54  --   --   --   ALKPHOS 159*  --   --   --   BILITOT 1.1  --   --   --   PROT 6.3*  --   --   --  ALBUMIN 3.0* 3.4* 3.1* 3.1*    Recent Labs Lab 10/27/16 1452  LIPASE 17   CBC:  Recent Labs Lab 10/27/16 1452 10/27/16 2025 10/28/16 0605 10/28/16 2209 10/29/16 0429  WBC 7.0 6.1 7.1 8.5 9.0  NEUTROABS 4.6  --   --   --   --   HGB 9.5* 10.3* 9.7* 9.8* 9.8*  HCT 29.5* 32.4* 29.8* 30.1* 30.1*  MCV 90.2 90.5 89.5 89.1 89.1  PLT 237 270 249 250 239   Blood Culture    Component Value Date/Time   SDES URINE, RANDOM 10/28/2016 0735   SPECREQUEST NONE 10/28/2016 0735   CULT MULTIPLE SPECIES PRESENT, SUGGEST RECOLLECTION (A) 10/28/2016 0735   REPTSTATUS 10/29/2016 FINAL 10/28/2016 0735    CBG:  Recent Labs Lab 10/28/16 1706 10/28/16 2311 10/29/16 0214 10/29/16 0541 10/29/16 0747  GLUCAP 239* 344* 301* 247* 190*   Studies/Results: Ct Chest Wo Contrast  Result Date: 10/28/2016 CLINICAL DATA:  Acute onset of wheezing. Subacute onset of dyspnea and cough. Initial encounter. EXAM:  CT CHEST WITHOUT CONTRAST TECHNIQUE: Multidetector CT imaging of the chest was performed following the standard protocol without IV contrast. COMPARISON:  Chest radiograph performed 10/27/2016, and CTA of the chest performed 10/19/2010 FINDINGS: Cardiovascular: The heart is mildly enlarged. Calcification at the aortic and mitral valves. Scattered coronary artery calcifications are seen. Scattered calcification is seen along the thoracic aorta and proximal great vessels. The vasculature is not well assessed without contrast. Mediastinum/Nodes: The mediastinum is otherwise grossly unremarkable. No definite mediastinal lymphadenopathy is seen. No pericardial effusion is identified. Hypodensities within the thyroid gland measure up to 1.4 cm, likely within normal limits given their size. No axillary lymphadenopathy is seen. Calcifications are seen at the left breast. Lungs/Pleura: Minimal bilateral peribronchovascular opacities likely reflect atelectasis. No pleural effusion or pneumothorax is seen. No masses are identified. Upper Abdomen: The visualized portions of the liver and spleen are grossly unremarkable. The visualized portions of the pancreas, adrenal glands and kidneys are within normal limits. Musculoskeletal: No acute osseous abnormalities are identified. The visualized musculature is unremarkable in appearance. IMPRESSION: 1. Minimal bilateral peribronchovascular opacities likely reflect atelectasis. Lungs otherwise clear. 2. Mild cardiomegaly. Calcification at the aortic and mitral valves. 3. Scattered coronary artery calcifications seen. Electronically Signed   By: Garald Balding M.D.   On: 10/28/2016 23:22   Dg Abd Acute W/chest  Result Date: 10/27/2016 CLINICAL DATA:  LEFT abdominal pain. EXAM: DG ABDOMEN ACUTE W/ 1V CHEST COMPARISON:  CT abdomen and pelvis 08/28/2016. FINDINGS: Cardiomegaly. Possible early interstitial edema, with mildly prominent interstitial markings throughout both lung fields.  No effusion. No definite consolidation. Nonobstructive gas pattern. Enteric contrast remains within the colon, and it is unclear if this is from the previous CT study or an interval administration of barium. No osseous findings. No visible calcifications. IMPRESSION: Cardiomegaly. Possible early interstitial edema without definite consolidation. Nonobstructive gas pattern. Enteric contrast throughout the colon, query from prior CT or an additional barium study. Electronically Signed   By: Staci Righter M.D.   On: 10/27/2016 14:03   Medications:  . aspirin  81 mg Oral Daily  . carvedilol  12.5 mg Oral BID WC  . cefTRIAXone (ROCEPHIN)  IV  1 g Intravenous Q24H  . cloNIDine  0.1 mg Oral 2 times per day  . [START ON 11/05/2016] darbepoetin (ARANESP) injection - DIALYSIS  60 mcg Intravenous Q Wed-HD  . docusate sodium  100 mg Oral Daily  . docusate sodium  1 enema Rectal Once  .  doxercalciferol  3 mcg Intravenous Q M,W,F-HD  . ferric gluconate (FERRLECIT/NULECIT) IV  125 mg Intravenous Q M,W,F-HD  . fluticasone  1-2 spray Each Nare Daily  . heparin  5,000 Units Subcutaneous Q8H  . insulin aspart  0-9 Units Subcutaneous TID WC  . insulin detemir  12 Units Subcutaneous Q lunch  . lanthanum  2,000 mg Oral TID WC  . lisinopril  40 mg Oral QHS  . multivitamin  1 tablet Oral QHS  . pantoprazole  20 mg Oral Daily  . predniSONE  40 mg Oral Q breakfast  . sennosides  5 mL Oral BID  . sodium chloride flush  3 mL Intravenous Q12H

## 2016-10-29 NOTE — Consult Note (Signed)
            [facility] CM Primary Care Navigator  10/29/2016  [name] [birth year] [id number]   Went back to see patient at the bedside to identify possible discharge needs. Patient reports that she was having persistent chest, abdominal and left side pain that had led to this admission.  Patient endorses Dr. Prince Solian with [facility] as the primary care provider.   Patient shared using [facility] in [place] to obtain medications without any problem.   Patient reports managing her own medications at home using "pill box" system.   She mentioned using [employer] for transportation. Her daughter [name]) or sister [name]) had  been providing transportation to her doctors' appointments as needed.  Patient states that she is living alone but her daughter and sister serve as her primary caregivers at home as they usually come to her house when needed.  Anticipated discharge plan is home according to patient.  Patient  voiced understanding to call primary care provider's office when she gets back home for a post discharge follow-up appointment within a week or sooner if needs arise. Patient letter (with PCP's contact number) was provided as her reminder.  Patient has diagnoses of HF on this recent admission.  Patient admits not monitoring nor recording weight at home since she is weighed at dialysis three times a week. She is not fully aware of the signs and symptoms of HF that will need medical assistance as well as not being aware of the HF zones/ tool.  Patient admits that she needs some assistance in managing her chronic illnesses. She verbally agreed and opted a referral to [facility] care management rather than home visits and instead of phone calls (EMMI calls due to dialysis) to follow her up after discharge.   Referral made to [facility] telephonic care management for signs and symptoms of HF and assistance with disease management HF at  home.   For additional questions please contact:  Edwena Felty A. Corrie Reder, BSN, RN-BC [facility] PRIMARY CARE Navigator Cell: [phone]

## 2016-10-30 ENCOUNTER — Inpatient Hospital Stay (HOSPITAL_COMMUNITY): Payer: Medicare Other

## 2016-10-30 DIAGNOSIS — I509 Heart failure, unspecified: Secondary | ICD-10-CM

## 2016-10-30 LAB — CBC
HEMATOCRIT: 31.6 % — AB (ref 36.0–46.0)
HEMOGLOBIN: 10.5 g/dL — AB (ref 12.0–15.0)
MCH: 29.6 pg (ref 26.0–34.0)
MCHC: 33.2 g/dL (ref 30.0–36.0)
MCV: 89 fL (ref 78.0–100.0)
Platelets: 262 10*3/uL (ref 150–400)
RBC: 3.55 MIL/uL — ABNORMAL LOW (ref 3.87–5.11)
RDW: 18.2 % — ABNORMAL HIGH (ref 11.5–15.5)
WBC: 9.8 10*3/uL (ref 4.0–10.5)

## 2016-10-30 LAB — GLUCOSE, CAPILLARY
GLUCOSE-CAPILLARY: 181 mg/dL — AB (ref 65–99)
GLUCOSE-CAPILLARY: 333 mg/dL — AB (ref 65–99)
Glucose-Capillary: 143 mg/dL — ABNORMAL HIGH (ref 65–99)

## 2016-10-30 LAB — HEMOGLOBIN A1C
HEMOGLOBIN A1C: 8 % — AB (ref 4.8–5.6)
Mean Plasma Glucose: 183 mg/dL

## 2016-10-30 LAB — ECHOCARDIOGRAM COMPLETE
Height: 66 in
Weight: 3110.4 oz

## 2016-10-30 LAB — BASIC METABOLIC PANEL
ANION GAP: 10 (ref 5–15)
BUN: 21 mg/dL — ABNORMAL HIGH (ref 6–20)
CHLORIDE: 98 mmol/L — AB (ref 101–111)
CO2: 28 mmol/L (ref 22–32)
Calcium: 8.8 mg/dL — ABNORMAL LOW (ref 8.9–10.3)
Creatinine, Ser: 4.74 mg/dL — ABNORMAL HIGH (ref 0.44–1.00)
GFR calc non Af Amer: 9 mL/min — ABNORMAL LOW (ref >60)
GFR, EST AFRICAN AMERICAN: 10 mL/min — AB (ref >60)
Glucose, Bld: 177 mg/dL — ABNORMAL HIGH (ref 65–99)
POTASSIUM: 3.5 mmol/L (ref 3.5–5.1)
Sodium: 136 mmol/L (ref 135–145)

## 2016-10-30 MED ORDER — ALBUTEROL SULFATE (2.5 MG/3ML) 0.083% IN NEBU: 2.5000 mg | INHALATION_SOLUTION | Freq: Four times a day (QID) | RESPIRATORY_TRACT | 12 refills | Status: DC | PRN

## 2016-10-30 MED ORDER — DOXYCYCLINE HYCLATE 100 MG PO TABS: 100.0000 mg | ORAL_TABLET | Freq: Two times a day (BID) | ORAL | 0 refills | Status: DC

## 2016-10-30 MED ORDER — SENNOSIDES 8.8 MG/5ML PO SYRP
5.0000 mL | ORAL_SOLUTION | Freq: Two times a day (BID) | ORAL | 0 refills | Status: DC
Start: 2016-10-30 — End: 2017-11-26

## 2016-10-30 NOTE — Progress Notes (Signed)
Pt nebulizer delivered at bedside by [facility].

## 2016-10-30 NOTE — Progress Notes (Signed)
[facility] Progress Note  Dialysis Orders:  MWF [place] 4 hr 400/800 heparin 2500 2 K 2.25 Ca right upper AVF EDW 91.5 Profile 4; venofer through 4/11 Mircera 60 last 4/4 hectorol 3 Recent labs: hgb 10.2 4/4 20% sat ferritin 1100 iPTH 152 Ca/P ok   Assessment/Plan: 1. Acute respiratory failure due to volume excess - ^ volume contributing to orthopnea, LE edema, cough and elevated BP. She is poorly tolerant of large volume removal - had HD Tuesday  for extra tmt for ultrafiltration and again Wed  to lower EDW for discharge; Had Echo this am - pending 2. Abdominal pain - unclear source may just be related to coughing 3. ESRD- MWF - K 3.5 after HD yesterday; access issue - planned f'gram 4/17 at [facility] for low AF; next HD Friday if here 4. Hypertension/volume- Titrating EDW down while here - she has been resistant to lowering EDW in outpt setting due to cramping. Set for 3L on Tuesday - net UF was 2738 with post wt 90.6.Marland Kitchen Net UF 2.3 on Wed with a post weight  of 89.4 standing Plan lower EDWfor d/c. Try to UF more on Friday. LE edema improving. No record of BP drop on dialysis BP log 5. Anemia- hgb 10.5 stable - redose Mircera due next week - had last IV Fe dose yesterday 6. Metabolic bone disease- hectorol and fosrenol- controlled 7. Nutrition- alb 3.1 renal carb mod diet/vits 8. DM - per primary BS ^ 9. Disp - ok for d/c from renal perspective.  Myriam Jacobson, PA-C [place] 10/30/2016,8:52 AM  LOS: 3 days  I have seen and examined this patient and agree with plan per Amalia Hailey.  Echo done and waiting for it ot be read.  Pt ready for DC if echo OK . Pooja Camuso T,MD 10/30/2016 10:29 AM Subjective:   Had BP drop near the end of HD Wed with cramping and vomiting.  Objective Vitals:   10/29/16 1730 10/29/16 1747 10/29/16 1950 10/30/16 0518  BP: (!) 183/84 (!) 154/85 (!) 146/71 (!) 149/71  Pulse: 66 85 81 69  Resp:  16 16 16    Temp:  98.2 F (36.8 C) 98.9 F (37.2 C) 98.7 F (37.1 C)  TempSrc:  Oral Oral Oral  SpO2:  92% 96% 95%  Weight:  89.4 kg (197 lb 1.5 oz) 88.2 kg (194 lb 6.4 oz)   Height:       Physical Exam General: NAD Heart: RRR 2/6 murmur Lungs: no rales Abdomen: soft Extremities: 1 + LE edema improving Dialysis Access: right upper AVF + bruit   Additional Objective Labs: Basic Metabolic Panel:  Recent Labs Lab 10/27/16 2025  10/28/16 2209 10/29/16 0429 10/30/16 0436  NA 139  < > 132* 134* 136  K 4.5  < > 4.4 4.3 3.5  CL 99*  < > 94* 95* 98*  CO2 28  < > 27 27 28   GLUCOSE 251*  < > 392* 268* 177*  BUN 19  < > 19 24* 21*  CREATININE 5.95*  < > 4.88* 5.35* 4.74*  CALCIUM 9.0  < > 8.7* 8.9 8.8*  PHOS 4.6  --  4.0 4.0  --   < > = values in this interval not displayed. Liver Function Tests:  Recent Labs Lab 10/27/16 1452 10/27/16 2025 10/28/16 2209 10/29/16 0429  AST 38  --   --   --   ALT 54  --   --   --  ALKPHOS 159*  --   --   --   BILITOT 1.1  --   --   --   PROT 6.3*  --   --   --   ALBUMIN 3.0* 3.4* 3.1* 3.1*    Recent Labs Lab 10/27/16 1452  LIPASE 17   CBC:  Recent Labs Lab 10/27/16 1452 10/27/16 2025 10/28/16 0605 10/28/16 2209 10/29/16 0429 10/30/16 0436  WBC 7.0 6.1 7.1 8.5 9.0 9.8  NEUTROABS 4.6  --   --   --   --   --   HGB 9.5* 10.3* 9.7* 9.8* 9.8* 10.5*  HCT 29.5* 32.4* 29.8* 30.1* 30.1* 31.6*  MCV 90.2 90.5 89.5 89.1 89.1 89.0  PLT 237 270 249 250 239 262   Blood Culture    Component Value Date/Time   SDES URINE, RANDOM 10/28/2016 0735   SPECREQUEST NONE 10/28/2016 0735   CULT MULTIPLE SPECIES PRESENT, SUGGEST RECOLLECTION (A) 10/28/2016 0735   REPTSTATUS 10/29/2016 FINAL 10/28/2016 0735    Cardiac Enzymes: No results for input(s): CKTOTAL, CKMB, CKMBINDEX, TROPONINI in the last 168 hours. CBG:  Recent Labs Lab 10/29/16 0541 10/29/16 0747 10/29/16 1137 10/29/16 1938 10/30/16 0814  GLUCAP 247* 190* 193* 170* 143*    Iron Studies: No results for input(s): IRON, TIBC, TRANSFERRIN, FERRITIN in the last 72 hours. Lab Results  Component Value Date   INR 1.03 06/17/2013   INR 1.02 04/21/2013   Studies/Results: Ct Chest Wo Contrast  Result Date: 10/28/2016 CLINICAL DATA:  Acute onset of wheezing. Subacute onset of dyspnea and cough. Initial encounter. EXAM: CT CHEST WITHOUT CONTRAST TECHNIQUE: Multidetector CT imaging of the chest was performed following the standard protocol without IV contrast. COMPARISON:  Chest radiograph performed 10/27/2016, and CTA of the chest performed 10/19/2010 FINDINGS: Cardiovascular: The heart is mildly enlarged. Calcification at the aortic and mitral valves. Scattered coronary artery calcifications are seen. Scattered calcification is seen along the thoracic aorta and proximal great vessels. The vasculature is not well assessed without contrast. Mediastinum/Nodes: The mediastinum is otherwise grossly unremarkable. No definite mediastinal lymphadenopathy is seen. No pericardial effusion is identified. Hypodensities within the thyroid gland measure up to 1.4 cm, likely within normal limits given their size. No axillary lymphadenopathy is seen. Calcifications are seen at the left breast. Lungs/Pleura: Minimal bilateral peribronchovascular opacities likely reflect atelectasis. No pleural effusion or pneumothorax is seen. No masses are identified. Upper Abdomen: The visualized portions of the liver and spleen are grossly unremarkable. The visualized portions of the pancreas, adrenal glands and kidneys are within normal limits. Musculoskeletal: No acute osseous abnormalities are identified. The visualized musculature is unremarkable in appearance. IMPRESSION: 1. Minimal bilateral peribronchovascular opacities likely reflect atelectasis. Lungs otherwise clear. 2. Mild cardiomegaly. Calcification at the aortic and mitral valves. 3. Scattered coronary artery calcifications seen. Electronically  Signed   By: Garald Balding M.D.   On: 10/28/2016 23:22   Medications:  . aspirin  81 mg Oral Daily  . carvedilol  12.5 mg Oral BID WC  . cefTRIAXone (ROCEPHIN)  IV  1 g Intravenous Q24H  . cloNIDine  0.1 mg Oral 2 times per day  . [START ON 11/05/2016] darbepoetin (ARANESP) injection - DIALYSIS  60 mcg Intravenous Q Wed-HD  . docusate sodium  100 mg Oral Daily  . docusate sodium  1 enema Rectal Once  . doxercalciferol  3 mcg Intravenous Q M,W,F-HD  . ferric gluconate (FERRLECIT/NULECIT) IV  125 mg Intravenous Q M,W,F-HD  . fluticasone  1-2 spray Each Nare Daily  .  heparin  5,000 Units Subcutaneous Q8H  . insulin aspart  0-9 Units Subcutaneous TID WC  . insulin detemir  12 Units Subcutaneous Q lunch  . lanthanum  2,000 mg Oral TID WC  . lisinopril  40 mg Oral QHS  . multivitamin  1 tablet Oral QHS  . pantoprazole  20 mg Oral Daily  . predniSONE  40 mg Oral Q breakfast  . sennosides  5 mL Oral BID  . sodium chloride flush  3 mL Intravenous Q12H

## 2016-10-30 NOTE — Discharge Summary (Signed)
Physician Discharge Summary  [name] YWV:[id number] DOB: 1950-10-19 DOA: 10/27/2016  PCP: Tivis Ringer, MD  Admit date: 10/27/2016 Discharge date: 10/30/2016  Recommendations for Outpatient Follow-up:  1. Pt will need to follow up with PCP in 1-2 weeks post discharge 2. Please obtain BMP to evaluate electrolytes and kidney function 3. Please also check CBC to evaluate Hg and Hct levels  Discharge Diagnoses:  Principal Problem:   Acute respiratory failure with hypoxia (HCC) Active Problems:   CHF (congestive heart failure) (HCC)   Diabetes ([place])   End stage renal disease (HCC)   Essential hypertension   GERD (gastroesophageal reflux disease)  Discharge Condition: Stable  Diet recommendation: Heart healthy diet discussed in details   Brief Narrative:  66 y.o.femalewith medical history significant of ESRD on HD MWF, Diabetes mellitus, combined CHF, hypertension, breast cancer, was on HD Monday and started to experience left sided abd pain and worsening dyspnea. Pain was mostly in LLQ area and occasionally radiating to the flank area. She has been seen for this in the past two months and has been treated with cough suppressants, vialium and antibiotics. She received antibiotics about two weeks ago for her coughing. CT scan in February was unremarkable for etiology.  Assessment & Plan: Acute respiratory failure with hypoxia - appears to be volume overload but unclear if ? Underlying COPD vs asthma, will need nebulizer as needed at home  - currently on Prednisone 40 mg PO QD, will continue with taper upon discharge  - ECHO pending   Acute on chronic combined systolic and diastolic CHF - no recent ECHO on file, ECHO on this admit pending  - continue lisinopril - continue carvedilol  Left sided abdominal pain  Abnormal x-ray/constipation/impaction - placed on bowel regimen - less abd pain this AM after pt had BM x 2 episodes   Dysuria - Patient makes small  amounts of urine and has symptoms with each void - urinalysis unremarkable, pt was placed on rocephin - she currently has no fevers, WBC is WNL - changed to PO ABX on discharge   Cough - no wheezing on exam this AM, nebulizer helps, ? Asthma vs COPD - pt advised to follow up with PCP and possible referral to Pulmonologist  - Continue Tessalon - Continue Albuterol prn - continue prednisone taper   Diabetes mellitus, type 2 with complications of nephropathy  - Last A1C in our system in 2012 of 10.3%. - continue Levemir  Anemia - Secondary to ESRD  ESRD - On dialysis. MWF. Dr. Marval Regal follows as outpatient  Essential hypertension - Uncontrolled. Fluid status likely contributing. - continue carvedilol, clonidine, lisinopril   GERD - continue Protonix  Obesity  - Body mass index is 33.34 kg/m.  DVT prophylaxis: Heparin SQ Code Status: Full  Family Communication: Patient at bedside, daughter over the phone  Disposition Plan: home today   Consultants:   Nephrology   Procedures:   HD  Antimicrobials:   Rocephin 4/9 --> 4/11  Doxycycline 4/11 -->   Procedures/Studies: Ct Chest Wo Contrast  Result Date: 10/28/2016 CLINICAL DATA:  Acute onset of wheezing. Subacute onset of dyspnea and cough. Initial encounter. EXAM: CT CHEST WITHOUT CONTRAST TECHNIQUE: Multidetector CT imaging of the chest was performed following the standard protocol without IV contrast. COMPARISON:  Chest radiograph performed 10/27/2016, and CTA of the chest performed 10/19/2010 FINDINGS: Cardiovascular: The heart is mildly enlarged. Calcification at the aortic and mitral valves. Scattered coronary artery calcifications are seen. Scattered calcification is seen along  the thoracic aorta and proximal great vessels. The vasculature is not well assessed without contrast. Mediastinum/Nodes: The mediastinum is otherwise grossly unremarkable. No definite mediastinal lymphadenopathy is seen. No  pericardial effusion is identified. Hypodensities within the thyroid gland measure up to 1.4 cm, likely within normal limits given their size. No axillary lymphadenopathy is seen. Calcifications are seen at the left breast. Lungs/Pleura: Minimal bilateral peribronchovascular opacities likely reflect atelectasis. No pleural effusion or pneumothorax is seen. No masses are identified. Upper Abdomen: The visualized portions of the liver and spleen are grossly unremarkable. The visualized portions of the pancreas, adrenal glands and kidneys are within normal limits. Musculoskeletal: No acute osseous abnormalities are identified. The visualized musculature is unremarkable in appearance. IMPRESSION: 1. Minimal bilateral peribronchovascular opacities likely reflect atelectasis. Lungs otherwise clear. 2. Mild cardiomegaly. Calcification at the aortic and mitral valves. 3. Scattered coronary artery calcifications seen. Electronically Signed   By: Garald Balding M.D.   On: 10/28/2016 23:22   Dg Abd Acute W/chest  Result Date: 10/27/2016 CLINICAL DATA:  LEFT abdominal pain. EXAM: DG ABDOMEN ACUTE W/ 1V CHEST COMPARISON:  CT abdomen and pelvis 08/28/2016. FINDINGS: Cardiomegaly. Possible early interstitial edema, with mildly prominent interstitial markings throughout both lung fields. No effusion. No definite consolidation. Nonobstructive gas pattern. Enteric contrast remains within the colon, and it is unclear if this is from the previous CT study or an interval administration of barium. No osseous findings. No visible calcifications. IMPRESSION: Cardiomegaly. Possible early interstitial edema without definite consolidation. Nonobstructive gas pattern. Enteric contrast throughout the colon, query from prior CT or an additional barium study. Electronically Signed   By: Staci Righter M.D.   On: 10/27/2016 14:03    Discharge Exam: Vitals:   10/30/16 0518 10/30/16 0853  BP: (!) 149/71 (!) 168/62  Pulse: 69 80  Resp: 16  18  Temp: 98.7 F (37.1 C) 99.2 F (37.3 C)   Vitals:   10/29/16 1747 10/29/16 1950 10/30/16 0518 10/30/16 0853  BP: (!) 154/85 (!) 146/71 (!) 149/71 (!) 168/62  Pulse: 85 81 69 80  Resp: 16 16 16 18   Temp: 98.2 F (36.8 C) 98.9 F (37.2 C) 98.7 F (37.1 C) 99.2 F (37.3 C)  TempSrc: Oral Oral Oral Oral  SpO2: 92% 96% 95% 92%  Weight: 89.4 kg (197 lb 1.5 oz) 88.2 kg (194 lb 6.4 oz)    Height:        General: Pt is alert, follows commands appropriately, not in acute distress Cardiovascular: Regular rate and rhythm, S1/S2 +, no murmurs, no rubs, no gallops Respiratory: Clear to auscultation bilaterally, no wheezing, no crackles, no rhonchi Abdominal: Soft, non tender, non distended, bowel sounds +, no guarding   Discharge Instructions  Discharge Instructions    Diet - low sodium heart healthy    Complete by:  As directed    Diet - low sodium heart healthy    Complete by:  As directed    Increase activity slowly    Complete by:  As directed    Increase activity slowly    Complete by:  As directed      Allergies as of 10/30/2016      Reactions   Oxycodone Other (See Comments)   Hallucinations    Tramadol Other (See Comments)   hallucinations    Latex Hives      Medication List    TAKE these medications   acetaminophen 500 MG tablet Commonly known as:  TYLENOL Take 1,000 mg by mouth every 4 (  four) hours as needed for mild pain, moderate pain, fever or headache.   aspirin 81 MG chewable tablet Chew 81 mg by mouth daily with lunch.   benzonatate 100 MG capsule Commonly known as:  TESSALON PERLES Take 1 capsule (100 mg total) by mouth 3 (three) times daily as needed for cough.   carvedilol 12.5 MG tablet Commonly known as:  COREG Take 12.5 mg by mouth 2 (two) times daily with a meal.   cloNIDine 0.1 MG tablet Commonly known as:  CATAPRES Take 0.1 mg by mouth See admin instructions. Take 1 tablet (0.1 mg) twice daily  - with lunch and at bedtime    cyclobenzaprine 5 MG tablet Commonly known as:  FLEXERIL Take 5 mg by mouth 3 (three) times daily as needed for muscle spasms.   darbepoetin 200 MCG/0.4ML Soln injection Commonly known as:  ARANESP Inject 0.4 mLs (200 mcg total) into the vein every Wednesday with hemodialysis.   diazepam 2 MG tablet Commonly known as:  VALIUM Take 2 mg by mouth 2 (two) times daily as needed for muscle spasms.   doxercalciferol 4 MCG/2ML injection Commonly known as:  HECTOROL Inject 0.5 mLs (1 mcg total) into the vein every Monday, Wednesday, and Friday with hemodialysis.   doxycycline 100 MG tablet Commonly known as:  VIBRA-TABS Take 1 tablet (100 mg total) by mouth 2 (two) times daily.   fluticasone 50 MCG/ACT nasal spray Commonly known as:  FLONASE Place 1-2 sprays into both nostrils daily as needed for allergies or rhinitis.   GAS-X 80 MG chewable tablet Generic drug:  simethicone Chew 80 mg by mouth as needed for flatulence.   lanthanum 1000 MG chewable tablet Commonly known as:  FOSRENOL Chew 2,000 mg by mouth 3 (three) times daily with meals.   LEVEMIR FLEXTOUCH 100 UNIT/ML Pen Generic drug:  Insulin Detemir Inject 12 Units into the skin daily with lunch.   lisinopril 40 MG tablet Commonly known as:  PRINIVIL,ZESTRIL Take 40 mg by mouth at bedtime.   [place] FAST-MAX COLD FLU 5-10-200-325 MG/10ML Liqd Generic drug:  Phenylephrine-DM-GG-APAP Take 7.5 mLs by mouth 2 (two) times daily as needed (cough).   NOVOLOG FLEXPEN 100 UNIT/ML FlexPen Generic drug:  insulin aspart Inject 10-16 Units into the skin 3 (three) times daily with meals. Sliding scale   omeprazole 20 MG tablet Commonly known as:  PRILOSEC OTC Take 20 mg by mouth daily with lunch.   pantoprazole 20 MG tablet Commonly known as:  PROTONIX Take 1 tablet (20 mg total) by mouth daily. What changed:  when to take this  reasons to take this   predniSONE 10 MG tablet Commonly known as:  DELTASONE Take 4  tablets (40 mg total) by mouth daily with breakfast. Take 40 tablet on 4/12 and taper down by 10 mg daily until completed   PROAIR HFA 108 (90 Base) MCG/ACT inhaler Generic drug:  albuterol Inhale 2 puffs into the lungs every 6 (six) hours as needed for wheezing or shortness of breath. What changed:  Another medication with the same name was added. Make sure you understand how and when to take each.   albuterol (2.5 MG/3ML) 0.083% nebulizer solution Commonly known as:  PROVENTIL Take 3 mLs (2.5 mg total) by nebulization every 6 (six) hours as needed for wheezing or shortness of breath. What changed:  You were already taking a medication with the same name, and this prescription was added. Make sure you understand how and when to take each.   RENA-VITE  RX 1 MG Tabs Take 1 tablet by mouth daily with lunch.   sennosides 8.8 MG/5ML syrup Commonly known as:  SENOKOT Take 5 mLs by mouth 2 (two) times daily.   STOOL SOFTENER PO Take 1 capsule by mouth daily with lunch.            Durable Medical Equipment        Start     Ordered   10/30/16 1158  For home use only DME Nebulizer machine  Once    Question:  Patient needs a nebulizer to treat with the following condition  Answer:  Asthma   10/30/16 1157     Follow-up Information    Tivis Ringer, MD Follow up.   Specialty:  Internal Medicine Contact information: [place] [address] [phone]            The results of significant diagnostics from this hospitalization (including imaging, microbiology, ancillary and laboratory) are listed below for reference.     Microbiology: Recent Results (from the past 240 hour(s))  MRSA PCR Screening     Status: None   Collection Time: 10/27/16 11:28 PM  Result Value Ref Range Status   MRSA by PCR NEGATIVE NEGATIVE Final    Comment:        The GeneXpert MRSA Assay (FDA approved for NASAL specimens only), is one component of a comprehensive MRSA  colonization surveillance program. It is not intended to diagnose MRSA infection nor to guide or monitor treatment for MRSA infections.   Culture, Urine     Status: Abnormal   Collection Time: 10/28/16  7:35 AM  Result Value Ref Range Status   Specimen Description URINE, RANDOM  Final   Special Requests NONE  Final   Culture MULTIPLE SPECIES PRESENT, SUGGEST RECOLLECTION (A)  Final   Report Status 10/29/2016 FINAL  Final     Labs: Basic Metabolic Panel:  Recent Labs Lab 10/27/16 2025 10/28/16 0605 10/28/16 2209 10/29/16 0429 10/30/16 0436  NA 139 137 132* 134* 136  K 4.5 4.7 4.4 4.3 3.5  CL 99* 97* 94* 95* 98*  CO2 28 28 27 27 28   GLUCOSE 251* 301* 392* 268* 177*  BUN 19 26* 19 24* 21*  CREATININE 5.95* 6.67* 4.88* 5.35* 4.74*  CALCIUM 9.0 8.8* 8.7* 8.9 8.8*  PHOS 4.6  --  4.0 4.0  --    Liver Function Tests:  Recent Labs Lab 10/27/16 1452 10/27/16 2025 10/28/16 2209 10/29/16 0429  AST 38  --   --   --   ALT 54  --   --   --   ALKPHOS 159*  --   --   --   BILITOT 1.1  --   --   --   PROT 6.3*  --   --   --   ALBUMIN 3.0* 3.4* 3.1* 3.1*    Recent Labs Lab 10/27/16 1452  LIPASE 17   CBC:  Recent Labs Lab 10/27/16 1452 10/27/16 2025 10/28/16 0605 10/28/16 2209 10/29/16 0429 10/30/16 0436  WBC 7.0 6.1 7.1 8.5 9.0 9.8  NEUTROABS 4.6  --   --   --   --   --   HGB 9.5* 10.3* 9.7* 9.8* 9.8* 10.5*  HCT 29.5* 32.4* 29.8* 30.1* 30.1* 31.6*  MCV 90.2 90.5 89.5 89.1 89.1 89.0  PLT 237 270 249 250 239 262   BNP (last 3 results)  Recent Labs  10/27/16 1452  BNP 2,605.8*   CBG:  Recent Labs Lab  10/29/16 0747 10/29/16 1137 10/29/16 1938 10/30/16 0814 10/30/16 1156  GLUCAP 190* 193* 170* 143* 181*   SIGNED: Time coordinating discharge: 30 minutes  Faye Ramsay, MD  [facility] 10/30/2016, 11:59 AM Pager [phone]  If 7PM-7AM, please contact night-coverage [email or web address] Password [email or web address]

## 2016-10-30 NOTE — Care Management Note (Signed)
Case Management Note  Patient Details  Name: [name] MRN: [id number] Date of Birth: 12-10-1950  Subjective/Objective:    CM following for progression and d/c planning.                 Action/Plan: 10/30/2016 Notified by pt RN, [name] that this pt is ready to d/c and will need neb machine, noted order and this CM contacted [facility] to assure that they were aware of order and will deliver neb machine to room as pt will d/c to home today.   Expected Discharge Date:  10/30/16               Expected Discharge Plan:  Home/Self Care  In-House Referral:  NA  Discharge planning Services  CM Consult  Post Acute Care Choice:  Durable Medical Equipment Choice offered to:  Patient  DME Arranged:  Nebulizer machine DME Agency:  [place]:  NA [place] Agency:  NA  Status of Service:  Completed, signed off  If discussed at [place] of Stay Meetings, dates discussed:    Additional Comments:  Adron Bene, RN 10/30/2016, 12:14 PM

## 2016-10-30 NOTE — Progress Notes (Signed)
Pt discharged to home, discharge instructions, medication and prescriptions discussed and reviewed with pt,  Verbalized understanding. Telemetry discontinued, CCMD notified. IV discontinued, catheter intact, site clean and dry. Pt was escorted out of the unit in wheelchair, took all belongings with her including her nebulizer.

## 2016-10-31 ENCOUNTER — Other Ambulatory Visit: Payer: Self-pay

## 2016-10-31 DIAGNOSIS — N2581 Secondary hyperparathyroidism of renal origin: Secondary | ICD-10-CM | POA: Diagnosis not present

## 2016-10-31 DIAGNOSIS — D631 Anemia in chronic kidney disease: Secondary | ICD-10-CM | POA: Diagnosis not present

## 2016-10-31 DIAGNOSIS — N186 End stage renal disease: Secondary | ICD-10-CM | POA: Diagnosis not present

## 2016-10-31 DIAGNOSIS — E1129 Type 2 diabetes mellitus with other diabetic kidney complication: Secondary | ICD-10-CM | POA: Diagnosis not present

## 2016-10-31 NOTE — Patient Outreach (Signed)
[facility]) Care Management  10/31/2016  [name] Mar 18, 1951 [id number]    Transition of Care Referral  Referral Date: 10/30/16 Referral Source: [facility] Primary Care Navigator Date of Discharge: 10/30/16 Facility: [place]: Medicare   Outreach attempt # 1 to patient. No answer at present. RN CM left HIPAA compliant voicemail message along with contact info.    Plan: RN CM will make outreach attempt to patient within one business day if no return call from patient.   Enzo Montgomery, RN,BSN,CCM [place] Management Telephonic Care Management Coordinator Direct Phone: [phone] Toll Free: [phone] Fax: [phone]

## 2016-11-03 ENCOUNTER — Other Ambulatory Visit: Payer: Self-pay

## 2016-11-03 DIAGNOSIS — D631 Anemia in chronic kidney disease: Secondary | ICD-10-CM | POA: Diagnosis not present

## 2016-11-03 DIAGNOSIS — N2581 Secondary hyperparathyroidism of renal origin: Secondary | ICD-10-CM | POA: Diagnosis not present

## 2016-11-03 DIAGNOSIS — N186 End stage renal disease: Secondary | ICD-10-CM | POA: Diagnosis not present

## 2016-11-03 DIAGNOSIS — E1129 Type 2 diabetes mellitus with other diabetic kidney complication: Secondary | ICD-10-CM | POA: Diagnosis not present

## 2016-11-03 NOTE — Patient Outreach (Signed)
[facility]) Care Management  11/03/2016  [name] Aug 25, 1950 [id number]   Transition of Care Referral  Referral Date: 10/30/16 Referral Source: [facility] Primary Care Navigator Date of Discharge: 10/30/16 Facility: [place]: Medicare   Outreach attempt #2 to patient. No answer at present and unable to leave message.    Plan: RN CM will make outreach attempt to patient within one business day if no return call from patient.   Enzo Montgomery, RN,BSN,CCM [place] Management Telephonic Care Management Coordinator Direct Phone: [phone] Toll Free: [phone] Fax: [phone]

## 2016-11-04 ENCOUNTER — Other Ambulatory Visit: Payer: Self-pay

## 2016-11-04 NOTE — Patient Outreach (Signed)
[facility]) Care Management  11/04/2016  [name] Aug 07, 1950 [id number]   Transition of Care Referral  Referral Date: 10/30/16 Referral Source: [facility] Primary Care Navigator Date of Discharge: 10/30/16 Facility: [place]: Medicare    Outreach attempt #3 to patient. No answer at present. RN CM left HIPAA compliant voicemail message along with contact info.     Plan: RN CM will send unsuccessful outreach letter and close case if no response from patient within 10 business days.   Enzo Montgomery, RN,BSN,CCM [place] Management Telephonic Care Management Coordinator Direct Phone: [phone] Toll Free: [phone] Fax: [phone]

## 2016-11-05 DIAGNOSIS — D631 Anemia in chronic kidney disease: Secondary | ICD-10-CM | POA: Diagnosis not present

## 2016-11-05 DIAGNOSIS — N186 End stage renal disease: Secondary | ICD-10-CM | POA: Diagnosis not present

## 2016-11-05 DIAGNOSIS — E1129 Type 2 diabetes mellitus with other diabetic kidney complication: Secondary | ICD-10-CM | POA: Diagnosis not present

## 2016-11-05 DIAGNOSIS — N2581 Secondary hyperparathyroidism of renal origin: Secondary | ICD-10-CM | POA: Diagnosis not present

## 2016-11-07 DIAGNOSIS — N186 End stage renal disease: Secondary | ICD-10-CM | POA: Diagnosis not present

## 2016-11-07 DIAGNOSIS — D631 Anemia in chronic kidney disease: Secondary | ICD-10-CM | POA: Diagnosis not present

## 2016-11-07 DIAGNOSIS — N2581 Secondary hyperparathyroidism of renal origin: Secondary | ICD-10-CM | POA: Diagnosis not present

## 2016-11-07 DIAGNOSIS — E1129 Type 2 diabetes mellitus with other diabetic kidney complication: Secondary | ICD-10-CM | POA: Diagnosis not present

## 2016-11-10 DIAGNOSIS — N186 End stage renal disease: Secondary | ICD-10-CM | POA: Diagnosis not present

## 2016-11-10 DIAGNOSIS — E1129 Type 2 diabetes mellitus with other diabetic kidney complication: Secondary | ICD-10-CM | POA: Diagnosis not present

## 2016-11-10 DIAGNOSIS — D631 Anemia in chronic kidney disease: Secondary | ICD-10-CM | POA: Diagnosis not present

## 2016-11-10 DIAGNOSIS — N2581 Secondary hyperparathyroidism of renal origin: Secondary | ICD-10-CM | POA: Diagnosis not present

## 2016-11-12 DIAGNOSIS — N186 End stage renal disease: Secondary | ICD-10-CM | POA: Diagnosis not present

## 2016-11-12 DIAGNOSIS — E1129 Type 2 diabetes mellitus with other diabetic kidney complication: Secondary | ICD-10-CM | POA: Diagnosis not present

## 2016-11-12 DIAGNOSIS — N2581 Secondary hyperparathyroidism of renal origin: Secondary | ICD-10-CM | POA: Diagnosis not present

## 2016-11-12 DIAGNOSIS — E1142 Type 2 diabetes mellitus with diabetic polyneuropathy: Secondary | ICD-10-CM | POA: Diagnosis not present

## 2016-11-12 DIAGNOSIS — D631 Anemia in chronic kidney disease: Secondary | ICD-10-CM | POA: Diagnosis not present

## 2016-11-14 DIAGNOSIS — D631 Anemia in chronic kidney disease: Secondary | ICD-10-CM | POA: Diagnosis not present

## 2016-11-14 DIAGNOSIS — N186 End stage renal disease: Secondary | ICD-10-CM | POA: Diagnosis not present

## 2016-11-14 DIAGNOSIS — E1129 Type 2 diabetes mellitus with other diabetic kidney complication: Secondary | ICD-10-CM | POA: Diagnosis not present

## 2016-11-14 DIAGNOSIS — N2581 Secondary hyperparathyroidism of renal origin: Secondary | ICD-10-CM | POA: Diagnosis not present

## 2016-11-17 DIAGNOSIS — N186 End stage renal disease: Secondary | ICD-10-CM | POA: Diagnosis not present

## 2016-11-17 DIAGNOSIS — E1129 Type 2 diabetes mellitus with other diabetic kidney complication: Secondary | ICD-10-CM | POA: Diagnosis not present

## 2016-11-17 DIAGNOSIS — D631 Anemia in chronic kidney disease: Secondary | ICD-10-CM | POA: Diagnosis not present

## 2016-11-17 DIAGNOSIS — N2581 Secondary hyperparathyroidism of renal origin: Secondary | ICD-10-CM | POA: Diagnosis not present

## 2016-11-17 DIAGNOSIS — Z992 Dependence on renal dialysis: Secondary | ICD-10-CM | POA: Diagnosis not present

## 2016-11-19 DIAGNOSIS — N2581 Secondary hyperparathyroidism of renal origin: Secondary | ICD-10-CM | POA: Diagnosis not present

## 2016-11-19 DIAGNOSIS — R300 Dysuria: Secondary | ICD-10-CM | POA: Diagnosis not present

## 2016-11-19 DIAGNOSIS — D631 Anemia in chronic kidney disease: Secondary | ICD-10-CM | POA: Diagnosis not present

## 2016-11-19 DIAGNOSIS — E1129 Type 2 diabetes mellitus with other diabetic kidney complication: Secondary | ICD-10-CM | POA: Diagnosis not present

## 2016-11-19 DIAGNOSIS — N186 End stage renal disease: Secondary | ICD-10-CM | POA: Diagnosis not present

## 2016-11-21 DIAGNOSIS — N186 End stage renal disease: Secondary | ICD-10-CM | POA: Diagnosis not present

## 2016-11-21 DIAGNOSIS — E1129 Type 2 diabetes mellitus with other diabetic kidney complication: Secondary | ICD-10-CM | POA: Diagnosis not present

## 2016-11-21 DIAGNOSIS — R300 Dysuria: Secondary | ICD-10-CM | POA: Diagnosis not present

## 2016-11-21 DIAGNOSIS — D631 Anemia in chronic kidney disease: Secondary | ICD-10-CM | POA: Diagnosis not present

## 2016-11-21 DIAGNOSIS — N2581 Secondary hyperparathyroidism of renal origin: Secondary | ICD-10-CM | POA: Diagnosis not present

## 2016-11-24 DIAGNOSIS — N2581 Secondary hyperparathyroidism of renal origin: Secondary | ICD-10-CM | POA: Diagnosis not present

## 2016-11-24 DIAGNOSIS — E1129 Type 2 diabetes mellitus with other diabetic kidney complication: Secondary | ICD-10-CM | POA: Diagnosis not present

## 2016-11-24 DIAGNOSIS — D631 Anemia in chronic kidney disease: Secondary | ICD-10-CM | POA: Diagnosis not present

## 2016-11-24 DIAGNOSIS — N186 End stage renal disease: Secondary | ICD-10-CM | POA: Diagnosis not present

## 2016-11-24 DIAGNOSIS — R300 Dysuria: Secondary | ICD-10-CM | POA: Diagnosis not present

## 2016-11-26 DIAGNOSIS — R300 Dysuria: Secondary | ICD-10-CM | POA: Diagnosis not present

## 2016-11-26 DIAGNOSIS — E1129 Type 2 diabetes mellitus with other diabetic kidney complication: Secondary | ICD-10-CM | POA: Diagnosis not present

## 2016-11-26 DIAGNOSIS — N186 End stage renal disease: Secondary | ICD-10-CM | POA: Diagnosis not present

## 2016-11-26 DIAGNOSIS — D631 Anemia in chronic kidney disease: Secondary | ICD-10-CM | POA: Diagnosis not present

## 2016-11-26 DIAGNOSIS — N2581 Secondary hyperparathyroidism of renal origin: Secondary | ICD-10-CM | POA: Diagnosis not present

## 2016-11-27 DIAGNOSIS — Z6833 Body mass index (BMI) 33.0-33.9, adult: Secondary | ICD-10-CM | POA: Diagnosis not present

## 2016-11-27 DIAGNOSIS — N186 End stage renal disease: Secondary | ICD-10-CM | POA: Diagnosis not present

## 2016-11-27 DIAGNOSIS — J9600 Acute respiratory failure, unspecified whether with hypoxia or hypercapnia: Secondary | ICD-10-CM | POA: Diagnosis not present

## 2016-11-27 DIAGNOSIS — R1013 Epigastric pain: Secondary | ICD-10-CM | POA: Diagnosis not present

## 2016-11-27 DIAGNOSIS — F329 Major depressive disorder, single episode, unspecified: Secondary | ICD-10-CM | POA: Diagnosis not present

## 2016-11-27 DIAGNOSIS — K5900 Constipation, unspecified: Secondary | ICD-10-CM | POA: Diagnosis not present

## 2016-11-27 DIAGNOSIS — I509 Heart failure, unspecified: Secondary | ICD-10-CM | POA: Diagnosis not present

## 2016-11-28 DIAGNOSIS — R300 Dysuria: Secondary | ICD-10-CM | POA: Diagnosis not present

## 2016-11-28 DIAGNOSIS — D631 Anemia in chronic kidney disease: Secondary | ICD-10-CM | POA: Diagnosis not present

## 2016-11-28 DIAGNOSIS — E1129 Type 2 diabetes mellitus with other diabetic kidney complication: Secondary | ICD-10-CM | POA: Diagnosis not present

## 2016-11-28 DIAGNOSIS — N186 End stage renal disease: Secondary | ICD-10-CM | POA: Diagnosis not present

## 2016-11-28 DIAGNOSIS — N2581 Secondary hyperparathyroidism of renal origin: Secondary | ICD-10-CM | POA: Diagnosis not present

## 2016-12-01 DIAGNOSIS — N186 End stage renal disease: Secondary | ICD-10-CM | POA: Diagnosis not present

## 2016-12-01 DIAGNOSIS — N2581 Secondary hyperparathyroidism of renal origin: Secondary | ICD-10-CM | POA: Diagnosis not present

## 2016-12-01 DIAGNOSIS — D631 Anemia in chronic kidney disease: Secondary | ICD-10-CM | POA: Diagnosis not present

## 2016-12-01 DIAGNOSIS — R300 Dysuria: Secondary | ICD-10-CM | POA: Diagnosis not present

## 2016-12-01 DIAGNOSIS — E1129 Type 2 diabetes mellitus with other diabetic kidney complication: Secondary | ICD-10-CM | POA: Diagnosis not present

## 2016-12-03 DIAGNOSIS — N186 End stage renal disease: Secondary | ICD-10-CM | POA: Diagnosis not present

## 2016-12-03 DIAGNOSIS — R300 Dysuria: Secondary | ICD-10-CM | POA: Diagnosis not present

## 2016-12-03 DIAGNOSIS — D631 Anemia in chronic kidney disease: Secondary | ICD-10-CM | POA: Diagnosis not present

## 2016-12-03 DIAGNOSIS — E1129 Type 2 diabetes mellitus with other diabetic kidney complication: Secondary | ICD-10-CM | POA: Diagnosis not present

## 2016-12-03 DIAGNOSIS — N2581 Secondary hyperparathyroidism of renal origin: Secondary | ICD-10-CM | POA: Diagnosis not present

## 2016-12-05 DIAGNOSIS — R300 Dysuria: Secondary | ICD-10-CM | POA: Diagnosis not present

## 2016-12-05 DIAGNOSIS — E1129 Type 2 diabetes mellitus with other diabetic kidney complication: Secondary | ICD-10-CM | POA: Diagnosis not present

## 2016-12-05 DIAGNOSIS — N186 End stage renal disease: Secondary | ICD-10-CM | POA: Diagnosis not present

## 2016-12-05 DIAGNOSIS — D631 Anemia in chronic kidney disease: Secondary | ICD-10-CM | POA: Diagnosis not present

## 2016-12-05 DIAGNOSIS — N2581 Secondary hyperparathyroidism of renal origin: Secondary | ICD-10-CM | POA: Diagnosis not present

## 2016-12-08 DIAGNOSIS — N2581 Secondary hyperparathyroidism of renal origin: Secondary | ICD-10-CM | POA: Diagnosis not present

## 2016-12-08 DIAGNOSIS — R300 Dysuria: Secondary | ICD-10-CM | POA: Diagnosis not present

## 2016-12-08 DIAGNOSIS — N186 End stage renal disease: Secondary | ICD-10-CM | POA: Diagnosis not present

## 2016-12-08 DIAGNOSIS — E1129 Type 2 diabetes mellitus with other diabetic kidney complication: Secondary | ICD-10-CM | POA: Diagnosis not present

## 2016-12-08 DIAGNOSIS — D631 Anemia in chronic kidney disease: Secondary | ICD-10-CM | POA: Diagnosis not present

## 2016-12-09 ENCOUNTER — Ambulatory Visit: Payer: Medicare Other | Admitting: Podiatry

## 2016-12-10 DIAGNOSIS — N186 End stage renal disease: Secondary | ICD-10-CM | POA: Diagnosis not present

## 2016-12-10 DIAGNOSIS — N2581 Secondary hyperparathyroidism of renal origin: Secondary | ICD-10-CM | POA: Diagnosis not present

## 2016-12-10 DIAGNOSIS — E1129 Type 2 diabetes mellitus with other diabetic kidney complication: Secondary | ICD-10-CM | POA: Diagnosis not present

## 2016-12-10 DIAGNOSIS — R300 Dysuria: Secondary | ICD-10-CM | POA: Diagnosis not present

## 2016-12-10 DIAGNOSIS — D631 Anemia in chronic kidney disease: Secondary | ICD-10-CM | POA: Diagnosis not present

## 2016-12-12 DIAGNOSIS — N2581 Secondary hyperparathyroidism of renal origin: Secondary | ICD-10-CM | POA: Diagnosis not present

## 2016-12-12 DIAGNOSIS — R300 Dysuria: Secondary | ICD-10-CM | POA: Diagnosis not present

## 2016-12-12 DIAGNOSIS — E1129 Type 2 diabetes mellitus with other diabetic kidney complication: Secondary | ICD-10-CM | POA: Diagnosis not present

## 2016-12-12 DIAGNOSIS — D631 Anemia in chronic kidney disease: Secondary | ICD-10-CM | POA: Diagnosis not present

## 2016-12-12 DIAGNOSIS — N186 End stage renal disease: Secondary | ICD-10-CM | POA: Diagnosis not present

## 2016-12-15 DIAGNOSIS — N186 End stage renal disease: Secondary | ICD-10-CM | POA: Diagnosis not present

## 2016-12-15 DIAGNOSIS — D631 Anemia in chronic kidney disease: Secondary | ICD-10-CM | POA: Diagnosis not present

## 2016-12-15 DIAGNOSIS — E1129 Type 2 diabetes mellitus with other diabetic kidney complication: Secondary | ICD-10-CM | POA: Diagnosis not present

## 2016-12-15 DIAGNOSIS — R300 Dysuria: Secondary | ICD-10-CM | POA: Diagnosis not present

## 2016-12-15 DIAGNOSIS — N2581 Secondary hyperparathyroidism of renal origin: Secondary | ICD-10-CM | POA: Diagnosis not present

## 2016-12-17 DIAGNOSIS — E1129 Type 2 diabetes mellitus with other diabetic kidney complication: Secondary | ICD-10-CM | POA: Diagnosis not present

## 2016-12-17 DIAGNOSIS — R300 Dysuria: Secondary | ICD-10-CM | POA: Diagnosis not present

## 2016-12-17 DIAGNOSIS — D631 Anemia in chronic kidney disease: Secondary | ICD-10-CM | POA: Diagnosis not present

## 2016-12-17 DIAGNOSIS — N186 End stage renal disease: Secondary | ICD-10-CM | POA: Diagnosis not present

## 2016-12-17 DIAGNOSIS — N2581 Secondary hyperparathyroidism of renal origin: Secondary | ICD-10-CM | POA: Diagnosis not present

## 2016-12-18 DIAGNOSIS — Z992 Dependence on renal dialysis: Secondary | ICD-10-CM | POA: Diagnosis not present

## 2016-12-18 DIAGNOSIS — N186 End stage renal disease: Secondary | ICD-10-CM | POA: Diagnosis not present

## 2016-12-18 DIAGNOSIS — E1129 Type 2 diabetes mellitus with other diabetic kidney complication: Secondary | ICD-10-CM | POA: Diagnosis not present

## 2016-12-19 DIAGNOSIS — E1129 Type 2 diabetes mellitus with other diabetic kidney complication: Secondary | ICD-10-CM | POA: Diagnosis not present

## 2016-12-19 DIAGNOSIS — N186 End stage renal disease: Secondary | ICD-10-CM | POA: Diagnosis not present

## 2016-12-19 DIAGNOSIS — N2581 Secondary hyperparathyroidism of renal origin: Secondary | ICD-10-CM | POA: Diagnosis not present

## 2016-12-19 DIAGNOSIS — D631 Anemia in chronic kidney disease: Secondary | ICD-10-CM | POA: Diagnosis not present

## 2016-12-22 DIAGNOSIS — N2581 Secondary hyperparathyroidism of renal origin: Secondary | ICD-10-CM | POA: Diagnosis not present

## 2016-12-22 DIAGNOSIS — D631 Anemia in chronic kidney disease: Secondary | ICD-10-CM | POA: Diagnosis not present

## 2016-12-22 DIAGNOSIS — E1129 Type 2 diabetes mellitus with other diabetic kidney complication: Secondary | ICD-10-CM | POA: Diagnosis not present

## 2016-12-22 DIAGNOSIS — N186 End stage renal disease: Secondary | ICD-10-CM | POA: Diagnosis not present

## 2016-12-24 DIAGNOSIS — E1129 Type 2 diabetes mellitus with other diabetic kidney complication: Secondary | ICD-10-CM | POA: Diagnosis not present

## 2016-12-24 DIAGNOSIS — H6121 Impacted cerumen, right ear: Secondary | ICD-10-CM | POA: Diagnosis not present

## 2016-12-24 DIAGNOSIS — D631 Anemia in chronic kidney disease: Secondary | ICD-10-CM | POA: Diagnosis not present

## 2016-12-24 DIAGNOSIS — N186 End stage renal disease: Secondary | ICD-10-CM | POA: Diagnosis not present

## 2016-12-24 DIAGNOSIS — N2581 Secondary hyperparathyroidism of renal origin: Secondary | ICD-10-CM | POA: Diagnosis not present

## 2016-12-24 DIAGNOSIS — J0141 Acute recurrent pansinusitis: Secondary | ICD-10-CM | POA: Diagnosis not present

## 2016-12-26 DIAGNOSIS — N2581 Secondary hyperparathyroidism of renal origin: Secondary | ICD-10-CM | POA: Diagnosis not present

## 2016-12-26 DIAGNOSIS — N186 End stage renal disease: Secondary | ICD-10-CM | POA: Diagnosis not present

## 2016-12-26 DIAGNOSIS — D631 Anemia in chronic kidney disease: Secondary | ICD-10-CM | POA: Diagnosis not present

## 2016-12-26 DIAGNOSIS — E1129 Type 2 diabetes mellitus with other diabetic kidney complication: Secondary | ICD-10-CM | POA: Diagnosis not present

## 2016-12-29 DIAGNOSIS — N186 End stage renal disease: Secondary | ICD-10-CM | POA: Diagnosis not present

## 2016-12-29 DIAGNOSIS — E1129 Type 2 diabetes mellitus with other diabetic kidney complication: Secondary | ICD-10-CM | POA: Diagnosis not present

## 2016-12-29 DIAGNOSIS — D631 Anemia in chronic kidney disease: Secondary | ICD-10-CM | POA: Diagnosis not present

## 2016-12-29 DIAGNOSIS — N2581 Secondary hyperparathyroidism of renal origin: Secondary | ICD-10-CM | POA: Diagnosis not present

## 2016-12-31 DIAGNOSIS — D631 Anemia in chronic kidney disease: Secondary | ICD-10-CM | POA: Diagnosis not present

## 2016-12-31 DIAGNOSIS — N2581 Secondary hyperparathyroidism of renal origin: Secondary | ICD-10-CM | POA: Diagnosis not present

## 2016-12-31 DIAGNOSIS — N186 End stage renal disease: Secondary | ICD-10-CM | POA: Diagnosis not present

## 2016-12-31 DIAGNOSIS — E1129 Type 2 diabetes mellitus with other diabetic kidney complication: Secondary | ICD-10-CM | POA: Diagnosis not present

## 2017-01-02 ENCOUNTER — Ambulatory Visit (INDEPENDENT_AMBULATORY_CARE_PROVIDER_SITE_OTHER): Payer: Medicare Other

## 2017-01-02 ENCOUNTER — Ambulatory Visit (INDEPENDENT_AMBULATORY_CARE_PROVIDER_SITE_OTHER): Payer: Medicare Other | Admitting: Physician Assistant

## 2017-01-02 ENCOUNTER — Other Ambulatory Visit (INDEPENDENT_AMBULATORY_CARE_PROVIDER_SITE_OTHER): Payer: Self-pay

## 2017-01-02 DIAGNOSIS — G8929 Other chronic pain: Secondary | ICD-10-CM

## 2017-01-02 DIAGNOSIS — M25511 Pain in right shoulder: Secondary | ICD-10-CM | POA: Diagnosis not present

## 2017-01-02 DIAGNOSIS — M542 Cervicalgia: Secondary | ICD-10-CM

## 2017-01-02 DIAGNOSIS — M25512 Pain in left shoulder: Secondary | ICD-10-CM

## 2017-01-02 DIAGNOSIS — N2581 Secondary hyperparathyroidism of renal origin: Secondary | ICD-10-CM | POA: Diagnosis not present

## 2017-01-02 DIAGNOSIS — E1129 Type 2 diabetes mellitus with other diabetic kidney complication: Secondary | ICD-10-CM | POA: Diagnosis not present

## 2017-01-02 DIAGNOSIS — N186 End stage renal disease: Secondary | ICD-10-CM | POA: Diagnosis not present

## 2017-01-02 DIAGNOSIS — D631 Anemia in chronic kidney disease: Secondary | ICD-10-CM | POA: Diagnosis not present

## 2017-01-02 MED ORDER — DIAZEPAM 5 MG PO TABS: ORAL_TABLET | ORAL | 0 refills | Status: DC

## 2017-01-02 NOTE — Progress Notes (Signed)
Office Visit Note   Patient: [name]           Date of Birth: July 12, 1951           MRN: [id number] Visit Date: 01/02/2017              Requested by: Prince Solian, MD [address] [place], [place] [address] PCP: Prince Solian, MD   Assessment & Plan: Visit Diagnoses:  1. Neck pain   2. Chronic left shoulder pain   3. Chronic right shoulder pain     Plan:We will obtain an MRI of her cervical spine to rule out HNP. She is claustrophobic and therefore value was given for her to take prior to and at the time of the MRI for claustrophobia. See her back after the MRI to go over the results and discuss further treatment.  Follow-Up Instructions: Return for After MRI.   Orders:  Orders Placed This Encounter  Procedures  . XR Cervical Spine 2 or 3 views  . XR Shoulder Left  . XR Shoulder Right   No orders of the defined types were placed in this encounter.     Procedures: No procedures performed   Clinical Data: No additional findings.   Subjective: Neck pain with radicular symptoms down bilateral upper extremities left greater than right Bilateral shoulder pain  HPI [name] is a 66 year old female comes in today with bilateral shoulder pain with also neck pain. Pain radiates down into her left arm and hand is been ongoing for at least last month. She has some pain at times down the right arm. States her fingertips become numb at times. Assessing by Dr. Paulla Fore in the spring of 2017 was given some Neurontin states that really did not help. She is also given some exercises. She states her pain never really went completely away but this became worse over the last month. She does have chronic kidney disease stage IV. She's also diabetic.   Review of Systems Bilateral shoulder and neck pain with radicular symptoms down both arms.  Objective: Vital Signs: There were no vitals taken for this visit.  Physical Exam  Constitutional: She is oriented to person,  place, and time. She appears well-developed and well-nourished. No distress.  Neurological: She is alert and oriented to person, place, and time.  Skin: She is not diaphoretic.  Psychiatric: She has a normal mood and affect. Her behavior is normal.    Ortho Exam Radial pulses are 2+ bilaterally. 5 out of 5 strength with external and internal rotation against resistance bilateral shoulders. Negative impingement sign bilaterally. Empty can test is negative bilaterally. She has full forward flexion of the cervical spine without pain. Limited extension with discomfort of the cervical spine. Positive Spurling's. Tenderness medial borders of both scapula. Sensation grossly intact bilateral hands motor intact bilateral hands.  Specialty Comments:  No specialty comments available.  Imaging: Xr Cervical Spine 2 Or 3 Views  Result Date: 01/02/2017 Cervical spine AP and lateral: No acute fractures. No spinal listhesis. Endplate spurring at J0-9 C7. Disc space at our overall well maintained. Normal lordotic curvature of the spine.  Xr Shoulder Left  Result Date: 01/02/2017 Left shoulder AP axillary and Y view: Shoulders well located. No acute fractures. Glenohumeral joint is well maintained.  Xr Shoulder Right  Result Date: 01/02/2017 Right shoulder 3 views AP, axillary and Y view: No acute fracture. Glenohumeral joint is well maintained. No subluxation dislocation.    PMFS History: Patient Active Problem List  Diagnosis Date Noted  . Acute respiratory failure with hypoxia ([name]) 10/27/2016  . Essential hypertension 10/27/2016  . GERD (gastroesophageal reflux disease) 10/27/2016  . Poor venous access 02/08/2015  . End stage renal disease ([name]) 07/08/2013  . Anemia 06/15/2013  . CHF (congestive heart failure) ([place]) 06/15/2013  . Diabetes ([place]) 06/15/2013  . Chronic kidney disease (CKD), stage IV (severe) ([name]) 06/03/2013  . Myalgia 12/31/2011  . Neuropathy 12/31/2011  . Breast cancer of  upper-inner quadrant of left female breast ([place]) 06/27/2011   Past Medical History:  Diagnosis Date  . Anemia   . Anxiety   . Arthritis    "joints" (06/15/2013)  . Blood transfusion without reported diagnosis   . Breast cancer ([name])    left  . CHF (congestive heart failure) ([place])   . ESRD (end stage renal disease) ([name])    "suppose to start dialysis today" (06/15/2013)  . GERD (gastroesophageal reflux disease)   . Hypertension   . Myalgia 12/31/2011  . Neuropathy 12/31/2011  . Shortness of breath    "lately it's been all the time" (06/15/2013)  . Type II diabetes mellitus ([name])     Family History  Problem Relation Age of Onset  . Diabetes Mother   . Hyperlipidemia Mother   . Hypertension Mother   . Hypertension Father   . Diabetes Sister   . Diabetes Brother   . Hypertension Brother   . Heart attack Brother   . Kidney disease Brother   . Colon cancer Neg Hx   . Colon polyps Neg Hx   . Esophageal cancer Neg Hx   . Gallbladder disease Neg Hx     Past Surgical History:  Procedure Laterality Date  . ABDOMINAL HYSTERECTOMY     partial  . AV FISTULA PLACEMENT Right 06/06/2013   Procedure: ARTERIOVENOUS (AV) FISTULA CREATION-RIGHT BRACHIAL CEPHALIC;  Surgeon: Conrad [place], MD;  Location: [place];  Service: Vascular;  Laterality: Right;  . BREAST BIOPSY Left   . BREAST LUMPECTOMY Left    "and took out some lymph nodes" (06/15/2013)  . CATARACT EXTRACTION W/ ANTERIOR VITRECTOMY Bilateral   . CESAREAN SECTION  1980  . EYE SURGERY Bilateral    laser surgery  . REFRACTIVE SURGERY Bilateral   . REMOVAL OF A DIALYSIS CATHETER Right 06/06/2013   Procedure: REMOVAL OF RIGHT MEDIPORT;  Surgeon: Conrad [place], MD;  Location: [place];  Service: Vascular;  Laterality: Right;  . TONSILLECTOMY     Social History   Occupational History  . Diabled    Social History Main Topics  . Smoking status: Never Smoker  . Smokeless tobacco: Never Used  . Alcohol use No  . Drug use: No  .  Sexual activity: No

## 2017-01-05 DIAGNOSIS — E1129 Type 2 diabetes mellitus with other diabetic kidney complication: Secondary | ICD-10-CM | POA: Diagnosis not present

## 2017-01-05 DIAGNOSIS — N186 End stage renal disease: Secondary | ICD-10-CM | POA: Diagnosis not present

## 2017-01-05 DIAGNOSIS — N2581 Secondary hyperparathyroidism of renal origin: Secondary | ICD-10-CM | POA: Diagnosis not present

## 2017-01-05 DIAGNOSIS — D631 Anemia in chronic kidney disease: Secondary | ICD-10-CM | POA: Diagnosis not present

## 2017-01-07 DIAGNOSIS — D631 Anemia in chronic kidney disease: Secondary | ICD-10-CM | POA: Diagnosis not present

## 2017-01-07 DIAGNOSIS — N186 End stage renal disease: Secondary | ICD-10-CM | POA: Diagnosis not present

## 2017-01-07 DIAGNOSIS — E1129 Type 2 diabetes mellitus with other diabetic kidney complication: Secondary | ICD-10-CM | POA: Diagnosis not present

## 2017-01-07 DIAGNOSIS — N2581 Secondary hyperparathyroidism of renal origin: Secondary | ICD-10-CM | POA: Diagnosis not present

## 2017-01-09 DIAGNOSIS — E1129 Type 2 diabetes mellitus with other diabetic kidney complication: Secondary | ICD-10-CM | POA: Diagnosis not present

## 2017-01-09 DIAGNOSIS — D631 Anemia in chronic kidney disease: Secondary | ICD-10-CM | POA: Diagnosis not present

## 2017-01-09 DIAGNOSIS — N186 End stage renal disease: Secondary | ICD-10-CM | POA: Diagnosis not present

## 2017-01-09 DIAGNOSIS — N2581 Secondary hyperparathyroidism of renal origin: Secondary | ICD-10-CM | POA: Diagnosis not present

## 2017-01-12 DIAGNOSIS — E1129 Type 2 diabetes mellitus with other diabetic kidney complication: Secondary | ICD-10-CM | POA: Diagnosis not present

## 2017-01-12 DIAGNOSIS — D631 Anemia in chronic kidney disease: Secondary | ICD-10-CM | POA: Diagnosis not present

## 2017-01-12 DIAGNOSIS — N2581 Secondary hyperparathyroidism of renal origin: Secondary | ICD-10-CM | POA: Diagnosis not present

## 2017-01-12 DIAGNOSIS — N186 End stage renal disease: Secondary | ICD-10-CM | POA: Diagnosis not present

## 2017-01-14 DIAGNOSIS — N186 End stage renal disease: Secondary | ICD-10-CM | POA: Diagnosis not present

## 2017-01-14 DIAGNOSIS — N2581 Secondary hyperparathyroidism of renal origin: Secondary | ICD-10-CM | POA: Diagnosis not present

## 2017-01-14 DIAGNOSIS — D631 Anemia in chronic kidney disease: Secondary | ICD-10-CM | POA: Diagnosis not present

## 2017-01-14 DIAGNOSIS — E1129 Type 2 diabetes mellitus with other diabetic kidney complication: Secondary | ICD-10-CM | POA: Diagnosis not present

## 2017-01-16 DIAGNOSIS — D631 Anemia in chronic kidney disease: Secondary | ICD-10-CM | POA: Diagnosis not present

## 2017-01-16 DIAGNOSIS — N2581 Secondary hyperparathyroidism of renal origin: Secondary | ICD-10-CM | POA: Diagnosis not present

## 2017-01-16 DIAGNOSIS — N186 End stage renal disease: Secondary | ICD-10-CM | POA: Diagnosis not present

## 2017-01-16 DIAGNOSIS — E1129 Type 2 diabetes mellitus with other diabetic kidney complication: Secondary | ICD-10-CM | POA: Diagnosis not present

## 2017-01-17 DIAGNOSIS — Z992 Dependence on renal dialysis: Secondary | ICD-10-CM | POA: Diagnosis not present

## 2017-01-17 DIAGNOSIS — N186 End stage renal disease: Secondary | ICD-10-CM | POA: Diagnosis not present

## 2017-01-17 DIAGNOSIS — E1129 Type 2 diabetes mellitus with other diabetic kidney complication: Secondary | ICD-10-CM | POA: Diagnosis not present

## 2017-01-19 ENCOUNTER — Telehealth (INDEPENDENT_AMBULATORY_CARE_PROVIDER_SITE_OTHER): Payer: Self-pay | Admitting: Physician Assistant

## 2017-01-19 DIAGNOSIS — D631 Anemia in chronic kidney disease: Secondary | ICD-10-CM | POA: Diagnosis not present

## 2017-01-19 DIAGNOSIS — E1129 Type 2 diabetes mellitus with other diabetic kidney complication: Secondary | ICD-10-CM | POA: Diagnosis not present

## 2017-01-19 DIAGNOSIS — N2581 Secondary hyperparathyroidism of renal origin: Secondary | ICD-10-CM | POA: Diagnosis not present

## 2017-01-19 DIAGNOSIS — N186 End stage renal disease: Secondary | ICD-10-CM | POA: Diagnosis not present

## 2017-01-19 DIAGNOSIS — R509 Fever, unspecified: Secondary | ICD-10-CM | POA: Diagnosis not present

## 2017-01-19 NOTE — Telephone Encounter (Signed)
Pt asked if she could have a valium prescribed prior to MRI Thursday.  [phone]

## 2017-01-20 ENCOUNTER — Other Ambulatory Visit (INDEPENDENT_AMBULATORY_CARE_PROVIDER_SITE_OTHER): Payer: Self-pay

## 2017-01-20 NOTE — Telephone Encounter (Signed)
Called into her [employer]

## 2017-01-20 NOTE — Telephone Encounter (Signed)
Patient called again concerning Valium for  MRI for Thursday.

## 2017-01-21 DIAGNOSIS — N186 End stage renal disease: Secondary | ICD-10-CM | POA: Diagnosis not present

## 2017-01-21 DIAGNOSIS — D631 Anemia in chronic kidney disease: Secondary | ICD-10-CM | POA: Diagnosis not present

## 2017-01-21 DIAGNOSIS — R509 Fever, unspecified: Secondary | ICD-10-CM | POA: Diagnosis not present

## 2017-01-21 DIAGNOSIS — N2581 Secondary hyperparathyroidism of renal origin: Secondary | ICD-10-CM | POA: Diagnosis not present

## 2017-01-21 DIAGNOSIS — E1129 Type 2 diabetes mellitus with other diabetic kidney complication: Secondary | ICD-10-CM | POA: Diagnosis not present

## 2017-01-22 ENCOUNTER — Ambulatory Visit
Admission: RE | Admit: 2017-01-22 | Discharge: 2017-01-22 | Disposition: A | Discharge status: Home / Self Care | Payer: Medicare Other | Source: Ambulatory Visit | Attending: Physician Assistant | Admitting: Physician Assistant

## 2017-01-22 DIAGNOSIS — M542 Cervicalgia: Principal | ICD-10-CM

## 2017-01-22 DIAGNOSIS — M4802 Spinal stenosis, cervical region: Secondary | ICD-10-CM | POA: Diagnosis not present

## 2017-01-22 DIAGNOSIS — G8929 Other chronic pain: Secondary | ICD-10-CM

## 2017-01-23 DIAGNOSIS — N186 End stage renal disease: Secondary | ICD-10-CM | POA: Diagnosis not present

## 2017-01-23 DIAGNOSIS — N2581 Secondary hyperparathyroidism of renal origin: Secondary | ICD-10-CM | POA: Diagnosis not present

## 2017-01-23 DIAGNOSIS — D631 Anemia in chronic kidney disease: Secondary | ICD-10-CM | POA: Diagnosis not present

## 2017-01-23 DIAGNOSIS — R300 Dysuria: Secondary | ICD-10-CM | POA: Diagnosis not present

## 2017-01-23 DIAGNOSIS — R509 Fever, unspecified: Secondary | ICD-10-CM | POA: Diagnosis not present

## 2017-01-23 DIAGNOSIS — E1129 Type 2 diabetes mellitus with other diabetic kidney complication: Secondary | ICD-10-CM | POA: Diagnosis not present

## 2017-01-26 DIAGNOSIS — E1129 Type 2 diabetes mellitus with other diabetic kidney complication: Secondary | ICD-10-CM | POA: Diagnosis not present

## 2017-01-26 DIAGNOSIS — N186 End stage renal disease: Secondary | ICD-10-CM | POA: Diagnosis not present

## 2017-01-26 DIAGNOSIS — D631 Anemia in chronic kidney disease: Secondary | ICD-10-CM | POA: Diagnosis not present

## 2017-01-26 DIAGNOSIS — R509 Fever, unspecified: Secondary | ICD-10-CM | POA: Diagnosis not present

## 2017-01-26 DIAGNOSIS — N2581 Secondary hyperparathyroidism of renal origin: Secondary | ICD-10-CM | POA: Diagnosis not present

## 2017-01-28 DIAGNOSIS — N186 End stage renal disease: Secondary | ICD-10-CM | POA: Diagnosis not present

## 2017-01-28 DIAGNOSIS — N2581 Secondary hyperparathyroidism of renal origin: Secondary | ICD-10-CM | POA: Diagnosis not present

## 2017-01-28 DIAGNOSIS — D631 Anemia in chronic kidney disease: Secondary | ICD-10-CM | POA: Diagnosis not present

## 2017-01-28 DIAGNOSIS — E1129 Type 2 diabetes mellitus with other diabetic kidney complication: Secondary | ICD-10-CM | POA: Diagnosis not present

## 2017-01-28 DIAGNOSIS — R509 Fever, unspecified: Secondary | ICD-10-CM | POA: Diagnosis not present

## 2017-01-30 DIAGNOSIS — D631 Anemia in chronic kidney disease: Secondary | ICD-10-CM | POA: Diagnosis not present

## 2017-01-30 DIAGNOSIS — E1129 Type 2 diabetes mellitus with other diabetic kidney complication: Secondary | ICD-10-CM | POA: Diagnosis not present

## 2017-01-30 DIAGNOSIS — N186 End stage renal disease: Secondary | ICD-10-CM | POA: Diagnosis not present

## 2017-01-30 DIAGNOSIS — N2581 Secondary hyperparathyroidism of renal origin: Secondary | ICD-10-CM | POA: Diagnosis not present

## 2017-01-30 DIAGNOSIS — R509 Fever, unspecified: Secondary | ICD-10-CM | POA: Diagnosis not present

## 2017-02-02 ENCOUNTER — Ambulatory Visit (INDEPENDENT_AMBULATORY_CARE_PROVIDER_SITE_OTHER): Payer: Medicare Other | Admitting: Physician Assistant

## 2017-02-02 DIAGNOSIS — R509 Fever, unspecified: Secondary | ICD-10-CM | POA: Diagnosis not present

## 2017-02-02 DIAGNOSIS — N186 End stage renal disease: Secondary | ICD-10-CM | POA: Diagnosis not present

## 2017-02-02 DIAGNOSIS — D631 Anemia in chronic kidney disease: Secondary | ICD-10-CM | POA: Diagnosis not present

## 2017-02-02 DIAGNOSIS — E1129 Type 2 diabetes mellitus with other diabetic kidney complication: Secondary | ICD-10-CM | POA: Diagnosis not present

## 2017-02-02 DIAGNOSIS — N2581 Secondary hyperparathyroidism of renal origin: Secondary | ICD-10-CM | POA: Diagnosis not present

## 2017-02-03 ENCOUNTER — Ambulatory Visit (INDEPENDENT_AMBULATORY_CARE_PROVIDER_SITE_OTHER): Payer: Medicare Other | Admitting: Orthopaedic Surgery

## 2017-02-04 DIAGNOSIS — E1129 Type 2 diabetes mellitus with other diabetic kidney complication: Secondary | ICD-10-CM | POA: Diagnosis not present

## 2017-02-04 DIAGNOSIS — R509 Fever, unspecified: Secondary | ICD-10-CM | POA: Diagnosis not present

## 2017-02-04 DIAGNOSIS — D631 Anemia in chronic kidney disease: Secondary | ICD-10-CM | POA: Diagnosis not present

## 2017-02-04 DIAGNOSIS — N186 End stage renal disease: Secondary | ICD-10-CM | POA: Diagnosis not present

## 2017-02-04 DIAGNOSIS — N2581 Secondary hyperparathyroidism of renal origin: Secondary | ICD-10-CM | POA: Diagnosis not present

## 2017-02-05 DIAGNOSIS — M79671 Pain in right foot: Secondary | ICD-10-CM | POA: Diagnosis not present

## 2017-02-05 DIAGNOSIS — F329 Major depressive disorder, single episode, unspecified: Secondary | ICD-10-CM | POA: Diagnosis not present

## 2017-02-05 DIAGNOSIS — I509 Heart failure, unspecified: Secondary | ICD-10-CM | POA: Diagnosis not present

## 2017-02-05 DIAGNOSIS — E1129 Type 2 diabetes mellitus with other diabetic kidney complication: Secondary | ICD-10-CM | POA: Diagnosis not present

## 2017-02-05 DIAGNOSIS — Z1389 Encounter for screening for other disorder: Secondary | ICD-10-CM | POA: Diagnosis not present

## 2017-02-05 DIAGNOSIS — Z6832 Body mass index (BMI) 32.0-32.9, adult: Secondary | ICD-10-CM | POA: Diagnosis not present

## 2017-02-05 DIAGNOSIS — N186 End stage renal disease: Secondary | ICD-10-CM | POA: Diagnosis not present

## 2017-02-05 DIAGNOSIS — R300 Dysuria: Secondary | ICD-10-CM | POA: Diagnosis not present

## 2017-02-05 DIAGNOSIS — J9600 Acute respiratory failure, unspecified whether with hypoxia or hypercapnia: Secondary | ICD-10-CM | POA: Diagnosis not present

## 2017-02-06 DIAGNOSIS — N2581 Secondary hyperparathyroidism of renal origin: Secondary | ICD-10-CM | POA: Diagnosis not present

## 2017-02-06 DIAGNOSIS — N186 End stage renal disease: Secondary | ICD-10-CM | POA: Diagnosis not present

## 2017-02-06 DIAGNOSIS — E1129 Type 2 diabetes mellitus with other diabetic kidney complication: Secondary | ICD-10-CM | POA: Diagnosis not present

## 2017-02-06 DIAGNOSIS — R509 Fever, unspecified: Secondary | ICD-10-CM | POA: Diagnosis not present

## 2017-02-06 DIAGNOSIS — D631 Anemia in chronic kidney disease: Secondary | ICD-10-CM | POA: Diagnosis not present

## 2017-02-09 DIAGNOSIS — N186 End stage renal disease: Secondary | ICD-10-CM | POA: Diagnosis not present

## 2017-02-09 DIAGNOSIS — D631 Anemia in chronic kidney disease: Secondary | ICD-10-CM | POA: Diagnosis not present

## 2017-02-09 DIAGNOSIS — R509 Fever, unspecified: Secondary | ICD-10-CM | POA: Diagnosis not present

## 2017-02-09 DIAGNOSIS — N2581 Secondary hyperparathyroidism of renal origin: Secondary | ICD-10-CM | POA: Diagnosis not present

## 2017-02-09 DIAGNOSIS — E1129 Type 2 diabetes mellitus with other diabetic kidney complication: Secondary | ICD-10-CM | POA: Diagnosis not present

## 2017-02-11 ENCOUNTER — Ambulatory Visit (INDEPENDENT_AMBULATORY_CARE_PROVIDER_SITE_OTHER): Payer: Medicare Other | Admitting: Orthopaedic Surgery

## 2017-02-11 DIAGNOSIS — N186 End stage renal disease: Secondary | ICD-10-CM | POA: Diagnosis not present

## 2017-02-11 DIAGNOSIS — R509 Fever, unspecified: Secondary | ICD-10-CM | POA: Diagnosis not present

## 2017-02-11 DIAGNOSIS — E1129 Type 2 diabetes mellitus with other diabetic kidney complication: Secondary | ICD-10-CM | POA: Diagnosis not present

## 2017-02-11 DIAGNOSIS — E1142 Type 2 diabetes mellitus with diabetic polyneuropathy: Secondary | ICD-10-CM | POA: Diagnosis not present

## 2017-02-11 DIAGNOSIS — N2581 Secondary hyperparathyroidism of renal origin: Secondary | ICD-10-CM | POA: Diagnosis not present

## 2017-02-11 DIAGNOSIS — D631 Anemia in chronic kidney disease: Secondary | ICD-10-CM | POA: Diagnosis not present

## 2017-02-13 DIAGNOSIS — N2581 Secondary hyperparathyroidism of renal origin: Secondary | ICD-10-CM | POA: Diagnosis not present

## 2017-02-13 DIAGNOSIS — D631 Anemia in chronic kidney disease: Secondary | ICD-10-CM | POA: Diagnosis not present

## 2017-02-13 DIAGNOSIS — E1129 Type 2 diabetes mellitus with other diabetic kidney complication: Secondary | ICD-10-CM | POA: Diagnosis not present

## 2017-02-13 DIAGNOSIS — N186 End stage renal disease: Secondary | ICD-10-CM | POA: Diagnosis not present

## 2017-02-13 DIAGNOSIS — R509 Fever, unspecified: Secondary | ICD-10-CM | POA: Diagnosis not present

## 2017-02-16 DIAGNOSIS — N2581 Secondary hyperparathyroidism of renal origin: Secondary | ICD-10-CM | POA: Diagnosis not present

## 2017-02-16 DIAGNOSIS — N186 End stage renal disease: Secondary | ICD-10-CM | POA: Diagnosis not present

## 2017-02-16 DIAGNOSIS — D631 Anemia in chronic kidney disease: Secondary | ICD-10-CM | POA: Diagnosis not present

## 2017-02-16 DIAGNOSIS — R509 Fever, unspecified: Secondary | ICD-10-CM | POA: Diagnosis not present

## 2017-02-16 DIAGNOSIS — E1129 Type 2 diabetes mellitus with other diabetic kidney complication: Secondary | ICD-10-CM | POA: Diagnosis not present

## 2017-02-17 DIAGNOSIS — N186 End stage renal disease: Secondary | ICD-10-CM | POA: Diagnosis not present

## 2017-02-17 DIAGNOSIS — Z992 Dependence on renal dialysis: Secondary | ICD-10-CM | POA: Diagnosis not present

## 2017-02-17 DIAGNOSIS — E1129 Type 2 diabetes mellitus with other diabetic kidney complication: Secondary | ICD-10-CM | POA: Diagnosis not present

## 2017-02-18 DIAGNOSIS — D631 Anemia in chronic kidney disease: Secondary | ICD-10-CM | POA: Diagnosis not present

## 2017-02-18 DIAGNOSIS — E1129 Type 2 diabetes mellitus with other diabetic kidney complication: Secondary | ICD-10-CM | POA: Diagnosis not present

## 2017-02-18 DIAGNOSIS — N186 End stage renal disease: Secondary | ICD-10-CM | POA: Diagnosis not present

## 2017-02-18 DIAGNOSIS — N2581 Secondary hyperparathyroidism of renal origin: Secondary | ICD-10-CM | POA: Diagnosis not present

## 2017-02-20 DIAGNOSIS — D631 Anemia in chronic kidney disease: Secondary | ICD-10-CM | POA: Diagnosis not present

## 2017-02-20 DIAGNOSIS — E1129 Type 2 diabetes mellitus with other diabetic kidney complication: Secondary | ICD-10-CM | POA: Diagnosis not present

## 2017-02-20 DIAGNOSIS — N2581 Secondary hyperparathyroidism of renal origin: Secondary | ICD-10-CM | POA: Diagnosis not present

## 2017-02-20 DIAGNOSIS — N186 End stage renal disease: Secondary | ICD-10-CM | POA: Diagnosis not present

## 2017-02-23 DIAGNOSIS — D631 Anemia in chronic kidney disease: Secondary | ICD-10-CM | POA: Diagnosis not present

## 2017-02-23 DIAGNOSIS — N2581 Secondary hyperparathyroidism of renal origin: Secondary | ICD-10-CM | POA: Diagnosis not present

## 2017-02-23 DIAGNOSIS — N186 End stage renal disease: Secondary | ICD-10-CM | POA: Diagnosis not present

## 2017-02-23 DIAGNOSIS — E1129 Type 2 diabetes mellitus with other diabetic kidney complication: Secondary | ICD-10-CM | POA: Diagnosis not present

## 2017-02-25 DIAGNOSIS — N186 End stage renal disease: Secondary | ICD-10-CM | POA: Diagnosis not present

## 2017-02-25 DIAGNOSIS — D631 Anemia in chronic kidney disease: Secondary | ICD-10-CM | POA: Diagnosis not present

## 2017-02-25 DIAGNOSIS — E1129 Type 2 diabetes mellitus with other diabetic kidney complication: Secondary | ICD-10-CM | POA: Diagnosis not present

## 2017-02-25 DIAGNOSIS — N2581 Secondary hyperparathyroidism of renal origin: Secondary | ICD-10-CM | POA: Diagnosis not present

## 2017-02-27 DIAGNOSIS — E1129 Type 2 diabetes mellitus with other diabetic kidney complication: Secondary | ICD-10-CM | POA: Diagnosis not present

## 2017-02-27 DIAGNOSIS — R300 Dysuria: Secondary | ICD-10-CM | POA: Diagnosis not present

## 2017-02-27 DIAGNOSIS — D631 Anemia in chronic kidney disease: Secondary | ICD-10-CM | POA: Diagnosis not present

## 2017-02-27 DIAGNOSIS — N186 End stage renal disease: Secondary | ICD-10-CM | POA: Diagnosis not present

## 2017-02-27 DIAGNOSIS — N2581 Secondary hyperparathyroidism of renal origin: Secondary | ICD-10-CM | POA: Diagnosis not present

## 2017-03-02 DIAGNOSIS — N186 End stage renal disease: Secondary | ICD-10-CM | POA: Diagnosis not present

## 2017-03-02 DIAGNOSIS — E1129 Type 2 diabetes mellitus with other diabetic kidney complication: Secondary | ICD-10-CM | POA: Diagnosis not present

## 2017-03-02 DIAGNOSIS — N2581 Secondary hyperparathyroidism of renal origin: Secondary | ICD-10-CM | POA: Diagnosis not present

## 2017-03-02 DIAGNOSIS — D631 Anemia in chronic kidney disease: Secondary | ICD-10-CM | POA: Diagnosis not present

## 2017-03-04 DIAGNOSIS — E1129 Type 2 diabetes mellitus with other diabetic kidney complication: Secondary | ICD-10-CM | POA: Diagnosis not present

## 2017-03-04 DIAGNOSIS — N2581 Secondary hyperparathyroidism of renal origin: Secondary | ICD-10-CM | POA: Diagnosis not present

## 2017-03-04 DIAGNOSIS — D631 Anemia in chronic kidney disease: Secondary | ICD-10-CM | POA: Diagnosis not present

## 2017-03-04 DIAGNOSIS — N186 End stage renal disease: Secondary | ICD-10-CM | POA: Diagnosis not present

## 2017-03-06 DIAGNOSIS — E1129 Type 2 diabetes mellitus with other diabetic kidney complication: Secondary | ICD-10-CM | POA: Diagnosis not present

## 2017-03-06 DIAGNOSIS — N186 End stage renal disease: Secondary | ICD-10-CM | POA: Diagnosis not present

## 2017-03-06 DIAGNOSIS — N2581 Secondary hyperparathyroidism of renal origin: Secondary | ICD-10-CM | POA: Diagnosis not present

## 2017-03-06 DIAGNOSIS — D631 Anemia in chronic kidney disease: Secondary | ICD-10-CM | POA: Diagnosis not present

## 2017-03-09 DIAGNOSIS — N186 End stage renal disease: Secondary | ICD-10-CM | POA: Diagnosis not present

## 2017-03-09 DIAGNOSIS — N2581 Secondary hyperparathyroidism of renal origin: Secondary | ICD-10-CM | POA: Diagnosis not present

## 2017-03-09 DIAGNOSIS — E1129 Type 2 diabetes mellitus with other diabetic kidney complication: Secondary | ICD-10-CM | POA: Diagnosis not present

## 2017-03-09 DIAGNOSIS — D631 Anemia in chronic kidney disease: Secondary | ICD-10-CM | POA: Diagnosis not present

## 2017-03-11 DIAGNOSIS — E1129 Type 2 diabetes mellitus with other diabetic kidney complication: Secondary | ICD-10-CM | POA: Diagnosis not present

## 2017-03-11 DIAGNOSIS — D631 Anemia in chronic kidney disease: Secondary | ICD-10-CM | POA: Diagnosis not present

## 2017-03-11 DIAGNOSIS — N2581 Secondary hyperparathyroidism of renal origin: Secondary | ICD-10-CM | POA: Diagnosis not present

## 2017-03-11 DIAGNOSIS — N186 End stage renal disease: Secondary | ICD-10-CM | POA: Diagnosis not present

## 2017-03-13 DIAGNOSIS — N2581 Secondary hyperparathyroidism of renal origin: Secondary | ICD-10-CM | POA: Diagnosis not present

## 2017-03-13 DIAGNOSIS — E1129 Type 2 diabetes mellitus with other diabetic kidney complication: Secondary | ICD-10-CM | POA: Diagnosis not present

## 2017-03-13 DIAGNOSIS — N186 End stage renal disease: Secondary | ICD-10-CM | POA: Diagnosis not present

## 2017-03-13 DIAGNOSIS — D631 Anemia in chronic kidney disease: Secondary | ICD-10-CM | POA: Diagnosis not present

## 2017-03-16 DIAGNOSIS — D631 Anemia in chronic kidney disease: Secondary | ICD-10-CM | POA: Diagnosis not present

## 2017-03-16 DIAGNOSIS — N186 End stage renal disease: Secondary | ICD-10-CM | POA: Diagnosis not present

## 2017-03-16 DIAGNOSIS — E1129 Type 2 diabetes mellitus with other diabetic kidney complication: Secondary | ICD-10-CM | POA: Diagnosis not present

## 2017-03-16 DIAGNOSIS — N2581 Secondary hyperparathyroidism of renal origin: Secondary | ICD-10-CM | POA: Diagnosis not present

## 2017-03-18 DIAGNOSIS — E1129 Type 2 diabetes mellitus with other diabetic kidney complication: Secondary | ICD-10-CM | POA: Diagnosis not present

## 2017-03-18 DIAGNOSIS — N186 End stage renal disease: Secondary | ICD-10-CM | POA: Diagnosis not present

## 2017-03-18 DIAGNOSIS — N2581 Secondary hyperparathyroidism of renal origin: Secondary | ICD-10-CM | POA: Diagnosis not present

## 2017-03-18 DIAGNOSIS — D631 Anemia in chronic kidney disease: Secondary | ICD-10-CM | POA: Diagnosis not present

## 2017-03-20 DIAGNOSIS — Z992 Dependence on renal dialysis: Secondary | ICD-10-CM | POA: Diagnosis not present

## 2017-03-20 DIAGNOSIS — N2581 Secondary hyperparathyroidism of renal origin: Secondary | ICD-10-CM | POA: Diagnosis not present

## 2017-03-20 DIAGNOSIS — N186 End stage renal disease: Secondary | ICD-10-CM | POA: Diagnosis not present

## 2017-03-20 DIAGNOSIS — D631 Anemia in chronic kidney disease: Secondary | ICD-10-CM | POA: Diagnosis not present

## 2017-03-20 DIAGNOSIS — E1129 Type 2 diabetes mellitus with other diabetic kidney complication: Secondary | ICD-10-CM | POA: Diagnosis not present

## 2017-03-23 DIAGNOSIS — D631 Anemia in chronic kidney disease: Secondary | ICD-10-CM | POA: Diagnosis not present

## 2017-03-23 DIAGNOSIS — Z23 Encounter for immunization: Secondary | ICD-10-CM | POA: Diagnosis not present

## 2017-03-23 DIAGNOSIS — N2581 Secondary hyperparathyroidism of renal origin: Secondary | ICD-10-CM | POA: Diagnosis not present

## 2017-03-23 DIAGNOSIS — E1129 Type 2 diabetes mellitus with other diabetic kidney complication: Secondary | ICD-10-CM | POA: Diagnosis not present

## 2017-03-23 DIAGNOSIS — N186 End stage renal disease: Secondary | ICD-10-CM | POA: Diagnosis not present

## 2017-03-25 DIAGNOSIS — Z23 Encounter for immunization: Secondary | ICD-10-CM | POA: Diagnosis not present

## 2017-03-25 DIAGNOSIS — D631 Anemia in chronic kidney disease: Secondary | ICD-10-CM | POA: Diagnosis not present

## 2017-03-25 DIAGNOSIS — E1129 Type 2 diabetes mellitus with other diabetic kidney complication: Secondary | ICD-10-CM | POA: Diagnosis not present

## 2017-03-25 DIAGNOSIS — N2581 Secondary hyperparathyroidism of renal origin: Secondary | ICD-10-CM | POA: Diagnosis not present

## 2017-03-25 DIAGNOSIS — N186 End stage renal disease: Secondary | ICD-10-CM | POA: Diagnosis not present

## 2017-03-27 DIAGNOSIS — E1129 Type 2 diabetes mellitus with other diabetic kidney complication: Secondary | ICD-10-CM | POA: Diagnosis not present

## 2017-03-27 DIAGNOSIS — N2581 Secondary hyperparathyroidism of renal origin: Secondary | ICD-10-CM | POA: Diagnosis not present

## 2017-03-27 DIAGNOSIS — N186 End stage renal disease: Secondary | ICD-10-CM | POA: Diagnosis not present

## 2017-03-27 DIAGNOSIS — Z23 Encounter for immunization: Secondary | ICD-10-CM | POA: Diagnosis not present

## 2017-03-27 DIAGNOSIS — D631 Anemia in chronic kidney disease: Secondary | ICD-10-CM | POA: Diagnosis not present

## 2017-03-30 DIAGNOSIS — D631 Anemia in chronic kidney disease: Secondary | ICD-10-CM | POA: Diagnosis not present

## 2017-03-30 DIAGNOSIS — N2581 Secondary hyperparathyroidism of renal origin: Secondary | ICD-10-CM | POA: Diagnosis not present

## 2017-03-30 DIAGNOSIS — Z23 Encounter for immunization: Secondary | ICD-10-CM | POA: Diagnosis not present

## 2017-03-30 DIAGNOSIS — N186 End stage renal disease: Secondary | ICD-10-CM | POA: Diagnosis not present

## 2017-03-30 DIAGNOSIS — E1129 Type 2 diabetes mellitus with other diabetic kidney complication: Secondary | ICD-10-CM | POA: Diagnosis not present

## 2017-04-01 DIAGNOSIS — N186 End stage renal disease: Secondary | ICD-10-CM | POA: Diagnosis not present

## 2017-04-01 DIAGNOSIS — D631 Anemia in chronic kidney disease: Secondary | ICD-10-CM | POA: Diagnosis not present

## 2017-04-01 DIAGNOSIS — Z23 Encounter for immunization: Secondary | ICD-10-CM | POA: Diagnosis not present

## 2017-04-01 DIAGNOSIS — E1129 Type 2 diabetes mellitus with other diabetic kidney complication: Secondary | ICD-10-CM | POA: Diagnosis not present

## 2017-04-01 DIAGNOSIS — N2581 Secondary hyperparathyroidism of renal origin: Secondary | ICD-10-CM | POA: Diagnosis not present

## 2017-04-03 DIAGNOSIS — N2581 Secondary hyperparathyroidism of renal origin: Secondary | ICD-10-CM | POA: Diagnosis not present

## 2017-04-03 DIAGNOSIS — Z23 Encounter for immunization: Secondary | ICD-10-CM | POA: Diagnosis not present

## 2017-04-03 DIAGNOSIS — D631 Anemia in chronic kidney disease: Secondary | ICD-10-CM | POA: Diagnosis not present

## 2017-04-03 DIAGNOSIS — N186 End stage renal disease: Secondary | ICD-10-CM | POA: Diagnosis not present

## 2017-04-03 DIAGNOSIS — E1129 Type 2 diabetes mellitus with other diabetic kidney complication: Secondary | ICD-10-CM | POA: Diagnosis not present

## 2017-04-06 DIAGNOSIS — Z23 Encounter for immunization: Secondary | ICD-10-CM | POA: Diagnosis not present

## 2017-04-06 DIAGNOSIS — E1129 Type 2 diabetes mellitus with other diabetic kidney complication: Secondary | ICD-10-CM | POA: Diagnosis not present

## 2017-04-06 DIAGNOSIS — D631 Anemia in chronic kidney disease: Secondary | ICD-10-CM | POA: Diagnosis not present

## 2017-04-06 DIAGNOSIS — N2581 Secondary hyperparathyroidism of renal origin: Secondary | ICD-10-CM | POA: Diagnosis not present

## 2017-04-06 DIAGNOSIS — N186 End stage renal disease: Secondary | ICD-10-CM | POA: Diagnosis not present

## 2017-04-07 DIAGNOSIS — H353212 Exudative age-related macular degeneration, right eye, with inactive choroidal neovascularization: Secondary | ICD-10-CM | POA: Diagnosis not present

## 2017-04-07 DIAGNOSIS — H353113 Nonexudative age-related macular degeneration, right eye, advanced atrophic without subfoveal involvement: Secondary | ICD-10-CM | POA: Diagnosis not present

## 2017-04-07 DIAGNOSIS — H35361 Drusen (degenerative) of macula, right eye: Secondary | ICD-10-CM | POA: Diagnosis not present

## 2017-04-07 DIAGNOSIS — E113551 Type 2 diabetes mellitus with stable proliferative diabetic retinopathy, right eye: Secondary | ICD-10-CM | POA: Diagnosis not present

## 2017-04-08 DIAGNOSIS — N2581 Secondary hyperparathyroidism of renal origin: Secondary | ICD-10-CM | POA: Diagnosis not present

## 2017-04-08 DIAGNOSIS — E1129 Type 2 diabetes mellitus with other diabetic kidney complication: Secondary | ICD-10-CM | POA: Diagnosis not present

## 2017-04-08 DIAGNOSIS — Z23 Encounter for immunization: Secondary | ICD-10-CM | POA: Diagnosis not present

## 2017-04-08 DIAGNOSIS — N186 End stage renal disease: Secondary | ICD-10-CM | POA: Diagnosis not present

## 2017-04-08 DIAGNOSIS — D631 Anemia in chronic kidney disease: Secondary | ICD-10-CM | POA: Diagnosis not present

## 2017-04-10 DIAGNOSIS — N2581 Secondary hyperparathyroidism of renal origin: Secondary | ICD-10-CM | POA: Diagnosis not present

## 2017-04-10 DIAGNOSIS — E1129 Type 2 diabetes mellitus with other diabetic kidney complication: Secondary | ICD-10-CM | POA: Diagnosis not present

## 2017-04-10 DIAGNOSIS — Z23 Encounter for immunization: Secondary | ICD-10-CM | POA: Diagnosis not present

## 2017-04-10 DIAGNOSIS — N186 End stage renal disease: Secondary | ICD-10-CM | POA: Diagnosis not present

## 2017-04-10 DIAGNOSIS — D631 Anemia in chronic kidney disease: Secondary | ICD-10-CM | POA: Diagnosis not present

## 2017-04-13 DIAGNOSIS — E1129 Type 2 diabetes mellitus with other diabetic kidney complication: Secondary | ICD-10-CM | POA: Diagnosis not present

## 2017-04-13 DIAGNOSIS — Z23 Encounter for immunization: Secondary | ICD-10-CM | POA: Diagnosis not present

## 2017-04-13 DIAGNOSIS — N186 End stage renal disease: Secondary | ICD-10-CM | POA: Diagnosis not present

## 2017-04-13 DIAGNOSIS — D631 Anemia in chronic kidney disease: Secondary | ICD-10-CM | POA: Diagnosis not present

## 2017-04-13 DIAGNOSIS — N2581 Secondary hyperparathyroidism of renal origin: Secondary | ICD-10-CM | POA: Diagnosis not present

## 2017-04-15 DIAGNOSIS — N186 End stage renal disease: Secondary | ICD-10-CM | POA: Diagnosis not present

## 2017-04-15 DIAGNOSIS — N2581 Secondary hyperparathyroidism of renal origin: Secondary | ICD-10-CM | POA: Diagnosis not present

## 2017-04-15 DIAGNOSIS — Z23 Encounter for immunization: Secondary | ICD-10-CM | POA: Diagnosis not present

## 2017-04-15 DIAGNOSIS — D631 Anemia in chronic kidney disease: Secondary | ICD-10-CM | POA: Diagnosis not present

## 2017-04-15 DIAGNOSIS — E1129 Type 2 diabetes mellitus with other diabetic kidney complication: Secondary | ICD-10-CM | POA: Diagnosis not present

## 2017-04-17 DIAGNOSIS — D631 Anemia in chronic kidney disease: Secondary | ICD-10-CM | POA: Diagnosis not present

## 2017-04-17 DIAGNOSIS — N2581 Secondary hyperparathyroidism of renal origin: Secondary | ICD-10-CM | POA: Diagnosis not present

## 2017-04-17 DIAGNOSIS — Z23 Encounter for immunization: Secondary | ICD-10-CM | POA: Diagnosis not present

## 2017-04-17 DIAGNOSIS — E1129 Type 2 diabetes mellitus with other diabetic kidney complication: Secondary | ICD-10-CM | POA: Diagnosis not present

## 2017-04-17 DIAGNOSIS — N186 End stage renal disease: Secondary | ICD-10-CM | POA: Diagnosis not present

## 2017-04-19 DIAGNOSIS — N186 End stage renal disease: Secondary | ICD-10-CM | POA: Diagnosis not present

## 2017-04-19 DIAGNOSIS — Z992 Dependence on renal dialysis: Secondary | ICD-10-CM | POA: Diagnosis not present

## 2017-04-19 DIAGNOSIS — E1129 Type 2 diabetes mellitus with other diabetic kidney complication: Secondary | ICD-10-CM | POA: Diagnosis not present

## 2017-04-20 DIAGNOSIS — N2581 Secondary hyperparathyroidism of renal origin: Secondary | ICD-10-CM | POA: Diagnosis not present

## 2017-04-20 DIAGNOSIS — D509 Iron deficiency anemia, unspecified: Secondary | ICD-10-CM | POA: Diagnosis not present

## 2017-04-20 DIAGNOSIS — D631 Anemia in chronic kidney disease: Secondary | ICD-10-CM | POA: Diagnosis not present

## 2017-04-20 DIAGNOSIS — N186 End stage renal disease: Secondary | ICD-10-CM | POA: Diagnosis not present

## 2017-04-22 DIAGNOSIS — D631 Anemia in chronic kidney disease: Secondary | ICD-10-CM | POA: Diagnosis not present

## 2017-04-22 DIAGNOSIS — N2581 Secondary hyperparathyroidism of renal origin: Secondary | ICD-10-CM | POA: Diagnosis not present

## 2017-04-22 DIAGNOSIS — N186 End stage renal disease: Secondary | ICD-10-CM | POA: Diagnosis not present

## 2017-04-22 DIAGNOSIS — D509 Iron deficiency anemia, unspecified: Secondary | ICD-10-CM | POA: Diagnosis not present

## 2017-04-23 DIAGNOSIS — H35021 Exudative retinopathy, right eye: Secondary | ICD-10-CM | POA: Diagnosis not present

## 2017-04-23 DIAGNOSIS — H31411 Hemorrhagic choroidal detachment, right eye: Secondary | ICD-10-CM | POA: Diagnosis not present

## 2017-04-23 DIAGNOSIS — E113591 Type 2 diabetes mellitus with proliferative diabetic retinopathy without macular edema, right eye: Secondary | ICD-10-CM | POA: Diagnosis not present

## 2017-04-23 DIAGNOSIS — H353211 Exudative age-related macular degeneration, right eye, with active choroidal neovascularization: Secondary | ICD-10-CM | POA: Diagnosis not present

## 2017-04-24 DIAGNOSIS — N2581 Secondary hyperparathyroidism of renal origin: Secondary | ICD-10-CM | POA: Diagnosis not present

## 2017-04-24 DIAGNOSIS — N186 End stage renal disease: Secondary | ICD-10-CM | POA: Diagnosis not present

## 2017-04-24 DIAGNOSIS — D631 Anemia in chronic kidney disease: Secondary | ICD-10-CM | POA: Diagnosis not present

## 2017-04-24 DIAGNOSIS — D509 Iron deficiency anemia, unspecified: Secondary | ICD-10-CM | POA: Diagnosis not present

## 2017-04-25 ENCOUNTER — Encounter (HOSPITAL_COMMUNITY): Payer: Self-pay | Admitting: Emergency Medicine

## 2017-04-25 ENCOUNTER — Emergency Department (HOSPITAL_COMMUNITY)
Admission: EM | Admit: 2017-04-25 | Discharge: 2017-04-25 | Disposition: A | Discharge status: Home / Self Care | Payer: Medicare Other | Attending: Emergency Medicine | Admitting: Emergency Medicine

## 2017-04-25 DIAGNOSIS — E1122 Type 2 diabetes mellitus with diabetic chronic kidney disease: Secondary | ICD-10-CM | POA: Diagnosis not present

## 2017-04-25 DIAGNOSIS — H547 Unspecified visual loss: Secondary | ICD-10-CM | POA: Diagnosis present

## 2017-04-25 DIAGNOSIS — Z91040 Latex allergy status: Secondary | ICD-10-CM | POA: Insufficient documentation

## 2017-04-25 DIAGNOSIS — H3561 Retinal hemorrhage, right eye: Secondary | ICD-10-CM | POA: Diagnosis not present

## 2017-04-25 DIAGNOSIS — H5461 Unqualified visual loss, right eye, normal vision left eye: Secondary | ICD-10-CM

## 2017-04-25 DIAGNOSIS — N186 End stage renal disease: Secondary | ICD-10-CM | POA: Diagnosis not present

## 2017-04-25 DIAGNOSIS — I509 Heart failure, unspecified: Secondary | ICD-10-CM | POA: Insufficient documentation

## 2017-04-25 DIAGNOSIS — Z79899 Other long term (current) drug therapy: Secondary | ICD-10-CM | POA: Insufficient documentation

## 2017-04-25 DIAGNOSIS — Z794 Long term (current) use of insulin: Secondary | ICD-10-CM | POA: Insufficient documentation

## 2017-04-25 DIAGNOSIS — H5440 Blindness, one eye, unspecified eye: Secondary | ICD-10-CM

## 2017-04-25 DIAGNOSIS — Z992 Dependence on renal dialysis: Secondary | ICD-10-CM | POA: Diagnosis not present

## 2017-04-25 DIAGNOSIS — I132 Hypertensive heart and chronic kidney disease with heart failure and with stage 5 chronic kidney disease, or end stage renal disease: Secondary | ICD-10-CM | POA: Diagnosis not present

## 2017-04-25 DIAGNOSIS — Z7982 Long term (current) use of aspirin: Secondary | ICD-10-CM | POA: Insufficient documentation

## 2017-04-25 MED ORDER — PHENYLEPHRINE HCL 2.5 % OP SOLN
1.0000 [drp] | Freq: Once | OPHTHALMIC | Status: AC
  Administered 2017-04-25: 1 [drp] via OPHTHALMIC
  Filled 2017-04-25: qty 2

## 2017-04-25 MED ORDER — TETRACAINE HCL 0.5 % OP SOLN
2.0000 [drp] | Freq: Once | OPHTHALMIC | Status: AC
  Administered 2017-04-25: 2 [drp] via OPHTHALMIC
  Filled 2017-04-25: qty 4

## 2017-04-25 MED ORDER — TROPICAMIDE 1 % OP SOLN
1.0000 [drp] | Freq: Once | OPHTHALMIC | Status: AC
  Administered 2017-04-25: 1 [drp] via OPHTHALMIC
  Filled 2017-04-25: qty 2

## 2017-04-25 NOTE — Discharge Instructions (Signed)
Follow-up with your retinal specialist on Monday.

## 2017-04-25 NOTE — ED Provider Notes (Signed)
[facility] DEPT Provider Note   CSN: [id number] Arrival date & time: 04/25/17  1201     History   Chief Complaint Chief Complaint  Patient presents with  . Eye Problem    HPI [name] is a 66 y.o. female.  Patient is a dialysis patient. Normally dialyzed Monday Wednesday and Friday. Patient was dialyzed on Friday. Patient is also followed by Dr. Zadie Rhine the retinal specialist. Was seen by him on Thursday. Was having some development of blood in her right eye he injected the right eye things improved but then got worse again on Friday evening. Vision even worse today basically seeing only read. No other complaints. No concerns for stroke. Patient has a prosthesis in her left eye. Her only vision is from her right eye. Patient can still see light but vision is very red in appearance. They contacted ophthalmology on-call ophthalmology recommended she come in the ED for evaluation. Patient without any new symptoms. Other than the eye change.      Past Medical History:  Diagnosis Date  . Anemia   . Anxiety   . Arthritis    "joints" (06/15/2013)  . Blood transfusion without reported diagnosis   . Breast cancer ([place])    left  . CHF (congestive heart failure) ([place])   . ESRD (end stage renal disease) ([place])    "suppose to start dialysis today" (06/15/2013)  . GERD (gastroesophageal reflux disease)   . Hypertension   . Myalgia 12/31/2011  . Neuropathy 12/31/2011  . Shortness of breath    "lately it's been all the time" (06/15/2013)  . Type II diabetes mellitus [facility])     Patient Active Problem List   Diagnosis Date Noted  . Acute respiratory failure with hypoxia ([place]) 10/27/2016  . Essential hypertension 10/27/2016  . GERD (gastroesophageal reflux disease) 10/27/2016  . Poor venous access 02/08/2015  . End stage renal disease ([place]) 07/08/2013  . Anemia 06/15/2013  . CHF (congestive heart failure) ([place]) 06/15/2013  . Diabetes ([place]) 06/15/2013  . Chronic kidney  disease (CKD), stage IV (severe) ([place]) 06/03/2013  . Myalgia 12/31/2011  . Neuropathy 12/31/2011  . Breast cancer of upper-inner quadrant of left female breast ([place]) 06/27/2011    Past Surgical History:  Procedure Laterality Date  . ABDOMINAL HYSTERECTOMY     partial  . AV FISTULA PLACEMENT Right 06/06/2013   Procedure: ARTERIOVENOUS (AV) FISTULA CREATION-RIGHT BRACHIAL CEPHALIC;  Surgeon: Conrad [place], MD;  Location: [place];  Service: Vascular;  Laterality: Right;  . BREAST BIOPSY Left   . BREAST LUMPECTOMY Left    "and took out some lymph nodes" (06/15/2013)  . CATARACT EXTRACTION W/ ANTERIOR VITRECTOMY Bilateral   . CESAREAN SECTION  1980  . EYE SURGERY Bilateral    laser surgery  . REFRACTIVE SURGERY Bilateral   . REMOVAL OF A DIALYSIS CATHETER Right 06/06/2013   Procedure: REMOVAL OF RIGHT MEDIPORT;  Surgeon: Conrad [place], MD;  Location: [place];  Service: Vascular;  Laterality: Right;  . TONSILLECTOMY      OB History    No data available       Home Medications    Prior to Admission medications   Medication Sig Start Date End Date Taking? Authorizing Provider  acetaminophen (TYLENOL) 500 MG tablet Take 1,000 mg by mouth every 4 (four) hours as needed for mild pain, moderate pain, fever or headache.    [provider]  albuterol (PROAIR HFA) 108 (90 Base) MCG/ACT inhaler Inhale 2 puffs into the  lungs every 6 (six) hours as needed for wheezing or shortness of breath.    [provider]  albuterol (PROVENTIL) (2.5 MG/3ML) 0.083% nebulizer solution Take 3 mLs (2.5 mg total) by nebulization every 6 (six) hours as needed for wheezing or shortness of breath. 10/30/16   Theodis Blaze, MD  aspirin 81 MG chewable tablet Chew 81 mg by mouth daily with lunch.     [provider]  B Complex-C-Folic Acid (RENA-VITE RX) 1 MG TABS Take 1 tablet by mouth daily with lunch.  09/29/16   [provider]  benzonatate (TESSALON PERLES) 100 MG capsule Take 1  capsule (100 mg total) by mouth 3 (three) times daily as needed for cough. 08/28/16   Mackuen, Courteney Lyn, MD  carvedilol (COREG) 12.5 MG tablet Take 12.5 mg by mouth 2 (two) times daily with a meal.    [provider]  cloNIDine (CATAPRES) 0.1 MG tablet Take 0.1 mg by mouth See admin instructions. Take 1 tablet (0.1 mg) twice daily  - with lunch and at bedtime    [provider]  cyclobenzaprine (FLEXERIL) 5 MG tablet Take 5 mg by mouth 3 (three) times daily as needed for muscle spasms.  08/18/16   [provider]  darbepoetin (ARANESP) 200 MCG/0.4ML SOLN injection Inject 0.4 mLs (200 mcg total) into the vein every Wednesday with hemodialysis. 06/22/13   Geradine Girt, DO  diazepam (VALIUM) 2 MG tablet Take 2 mg by mouth 2 (two) times daily as needed for muscle spasms.  09/28/16   [provider]  diazepam (VALIUM) 5 MG tablet Take 1 tab one hour prior to MRI, repeat as needed 01/02/17   Pete Pelt, PA-C  Docusate Calcium (STOOL SOFTENER PO) Take 1 capsule by mouth daily with lunch.     [provider]  doxercalciferol (HECTOROL) 4 MCG/2ML injection Inject 0.5 mLs (1 mcg total) into the vein every Monday, Wednesday, and Friday with hemodialysis. 06/18/13   Geradine Girt, DO  doxycycline (VIBRA-TABS) 100 MG tablet Take 1 tablet (100 mg total) by mouth 2 (two) times daily. 10/30/16   Theodis Blaze, MD  fluticasone [facility]) 50 MCG/ACT nasal spray Place 1-2 sprays into both nostrils daily as needed for allergies or rhinitis.     [provider]  insulin aspart (NOVOLOG FLEXPEN) 100 UNIT/ML FlexPen Inject 10-16 Units into the skin 3 (three) times daily with meals. Sliding scale     [provider]  Insulin Detemir (LEVEMIR FLEXTOUCH) 100 UNIT/ML Pen Inject 12 Units into the skin daily with lunch.    [provider]  lanthanum (FOSRENOL) 1000 MG chewable tablet Chew 2,000 mg by mouth 3 (three) times daily with meals.      [provider]  lisinopril (PRINIVIL,ZESTRIL) 40 MG tablet Take 40 mg by mouth at bedtime.     [provider]  omeprazole (PRILOSEC OTC) 20 MG tablet Take 20 mg by mouth daily with lunch.     [provider]  pantoprazole (PROTONIX) 20 MG tablet Take 1 tablet (20 mg total) by mouth daily. Patient taking differently: Take 20 mg by mouth daily as needed for heartburn.  02/08/15   [name], MD  Phenylephrine-DM-GG-APAP (MUCINEX FAST-MAX COLD FLU) 5-10-200-325 MG/10ML LIQD Take 7.5 mLs by mouth 2 (two) times daily as needed (cough).     [provider]  predniSONE (DELTASONE) 10 MG tablet Take 4 tablets (40 mg total) by mouth daily with breakfast. Take 40 tablet on  4/12 and taper down by 10 mg daily until completed 10/30/16   Theodis Blaze, MD  sennosides (SENOKOT) 8.8 MG/5ML syrup Take 5 mLs by mouth 2 (two) times daily. 10/30/16   Theodis Blaze, MD  simethicone (GAS-X) 80 MG chewable tablet Chew 80 mg by mouth as needed for flatulence.    [provider]    Family History Family History  Problem Relation Age of Onset  . Diabetes Mother   . Hyperlipidemia Mother   . Hypertension Mother   . Hypertension Father   . Diabetes Sister   . Diabetes Brother   . Hypertension Brother   . Heart attack Brother   . Kidney disease Brother   . Colon cancer Neg Hx   . Colon polyps Neg Hx   . Esophageal cancer Neg Hx   . Gallbladder disease Neg Hx     Social History Social History  Substance Use Topics  . Smoking status: Never Smoker  . Smokeless tobacco: Never Used  . Alcohol use No     Allergies   Oxycodone; Tramadol; and Latex   Review of Systems Review of Systems  Constitutional: Negative for fever.  HENT: Negative for congestion.   Eyes: Positive for visual disturbance. Negative for pain.  Respiratory: Negative for shortness of breath.   Cardiovascular: Negative for chest pain.  Gastrointestinal: Negative for abdominal pain.    Genitourinary: Negative for dysuria.  Musculoskeletal: Negative for back pain.  Skin: Negative for rash.  Allergic/Immunologic: Positive for immunocompromised state.  Neurological: Negative for speech difficulty, weakness, numbness and headaches.  Hematological: Bruises/bleeds easily.  Psychiatric/Behavioral: Negative for confusion.     Physical Exam Updated Vital Signs BP (!) 173/73   Pulse 71   Temp 97.9 F (36.6 C) (Oral)   Resp 15   SpO2 97%   Physical Exam  Constitutional: She is oriented to person, place, and time. She appears well-developed and well-nourished. No distress.  HENT:  Head: Normocephalic and atraumatic.  Mouth/Throat: Oropharynx is clear and moist.  Eyes: Conjunctivae are normal.  Left eye is prosthesis. Right eye pupil barely responsive to light. Light reflex seems to be red in color. Patient sclera is clear.  Neck: Normal range of motion. Neck supple.  Cardiovascular: Normal rate and regular rhythm.   Pulmonary/Chest: Effort normal and breath sounds normal. No respiratory distress.  Abdominal: Soft. Bowel sounds are normal. There is no tenderness.  Musculoskeletal: Normal range of motion.  AV fistula left arm.  Neurological: She is alert and oriented to person, place, and time. A cranial nerve deficit is present. No sensory deficit. She exhibits normal muscle tone. Coordination normal.  Skin: Skin is warm. No rash noted.  Nursing note and vitals reviewed.    ED Treatments / Results  Labs (all labs ordered are listed, but only abnormal results are displayed) Labs Reviewed - No data to display  EKG  EKG Interpretation None       Radiology No results found.  Procedures Procedures (including critical care time)  Medications Ordered in ED Medications - No data to display   Initial Impression / Assessment and Plan / ED Course  I have reviewed the triage vital signs and the nursing notes.  Pertinent labs & imaging results that were  available during my care of the patient were reviewed by me and considered in my medical decision making (see chart for details).    Symptoms seem to be consistent with retinal hemorrhage or either hemorrhage into the posterior chamber of  the globe. Discussed with the on-call ophthalmology Dr. Posey Pronto recommended dilating her eye. He will come in for further evaluation. Appears to be no evidence of any stroke. Patient's been dialyzed regularly this week. Patient medically cleared for ophthalmology.   Final Clinical Impressions(s) / ED Diagnoses   Final diagnoses:  Vision loss of right eye  Retinal hemorrhage of right eye    New Prescriptions New Prescriptions   No medications on file     Fredia Sorrow, MD 04/25/17 1642

## 2017-04-25 NOTE — ED Provider Notes (Signed)
Handed off patient by Dr. Rogene Houston. Plan to follow up off the consult. Ophthalmology came and saw and evaluated patient. They believe that she needs a follow-up on Monday with her retina specialist.   Macarthur Critchley, MD 04/25/17 [phone]

## 2017-04-25 NOTE — ED Triage Notes (Addendum)
Pt is a pt of MD [name], eye doc. 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 )She received an injection into her right eye several days ago because her right eye has blood in it upon exam. Pt states initial improvement, but starting yesterday she has had increase in blurriness and once again is unable to see out of it, since yesterday.  Pt daughter on the phone with mothers eye MD, states screen by EDP and then possibly they will come in or meet her in the office for another injection.  Pt has no neurological deficits other than visual symptoms, that pt states are the same that she had during eye exam earlier this week. MD [place] aware of pt and repeat symptoms.

## 2017-04-25 NOTE — Consult Note (Signed)
[name]                                                                               04/25/2017                                            Ophthalmology Consultation                                         Consult requested by: Dr. Rogene Kane  Reason for consultation:  Vision loss right eye  HPI: Longstanding patient of [name].  Patient had an injection (unclear whether intravitreal or subtenon's) on Thursday (04/23/17).  This morning she awoke with red haze in her vision.  She presented to ED with worsening vision.    Pertinent Medical History:   Active Ambulatory Problems    Diagnosis Date Noted  . Breast cancer of upper-inner quadrant of left female breast ([place]) 06/27/2011  . Myalgia 12/31/2011  . Neuropathy 12/31/2011  . Chronic kidney disease (CKD), stage IV (severe) ([place]) 06/03/2013  . Anemia 06/15/2013  . CHF (congestive heart failure) ([place]) 06/15/2013  . Diabetes ([place]) 06/15/2013  . End stage renal disease ([place]) 07/08/2013  . Poor venous access 02/08/2015  . Acute respiratory failure with hypoxia ([place]) 10/27/2016  . Essential hypertension 10/27/2016  . GERD (gastroesophageal reflux disease) 10/27/2016   Resolved Ambulatory Problems    Diagnosis Date Noted  . No Resolved Ambulatory Problems   Past Medical History:  Diagnosis Date  . Anemia   . Anxiety   . Arthritis   . Blood transfusion without reported diagnosis   . Breast cancer ([place])   . CHF (congestive heart failure) ([place])   . ESRD (end stage renal disease) ([place])   . GERD (gastroesophageal reflux disease)   . Hypertension   . Myalgia 12/31/2011  . Neuropathy 12/31/2011  . Shortness of breath   . 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  OD with history of vitrectomy (eye cleaned out according to patient and daughter) ([facility] note states vitrectomy and endolaser on (08/23/07) and serial eye injections (likely  intravitreal /anti-VEGF).   Current Eye Medications: none according to patient  Systemic medications on admission:   (Not in a hospital admission)      ROS: vision loss right eye - acute onset this AM  Visual Fields: limited to HM centrally   Pupils:  Pharmacologically dilated at my direction before exam.     Near acuity:   [place] HM   OD      [place] NLP  OS   - prosthesis  Tp:       OD: 24 mmhg   by Tonopen     Dilation:  right eye        Medication used  [ x ] NS 2.5%    [ x ]Tropicamide   External:   OD:  Normal       OS:  Normal      Anterior segment exam:   By slit lamp   Conjunctiva:  OD:  Quiet      OS:  Quiet     Cornea:    OD: Clear, no fluorescein stain         Anterior Chamber:   OD:  RBC's     Iris:    OD:  No NVI  Lens:    OD:  PCIOL with RBC's    Optic disc:  OD:  No view  Macula, vessels and periphery - no view OD  B-scan OD - vitreous hemorrhage; choroidals superior, inferior and temporal from 1 to 6 o'clock with shallow inferior subretinal fluid.    Impression:  Vitreous hemorrhage OD - limited view - ? 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   Severe proliferative diabetic retinopathy OD (by history)  Prosthesis OS - with likely history of vision loss OS    Recommendations/Plan:  Given complicated ocular history, vision loss secondary to vitreous hemorrhage, presence of underlying choroidals, and monocular status, I recommend that she follow-up with her primary retina specialist on Monday 04/27/17.  We had a long discussion regarding the potential problems associated with surgical intervention in the presence of choroidals, unknown duration of choroidals, and recent history of injection to the right eye.  I've discussed these findings with the ER attending. Please contact our office with any  questions or concerns at [phone].   [name]

## 2017-04-27 DIAGNOSIS — H4311 Vitreous hemorrhage, right eye: Secondary | ICD-10-CM | POA: Diagnosis not present

## 2017-04-27 DIAGNOSIS — H35021 Exudative retinopathy, right eye: Secondary | ICD-10-CM | POA: Diagnosis not present

## 2017-04-27 DIAGNOSIS — H2101 Hyphema, right eye: Secondary | ICD-10-CM | POA: Diagnosis not present

## 2017-04-27 DIAGNOSIS — H31411 Hemorrhagic choroidal detachment, right eye: Secondary | ICD-10-CM | POA: Diagnosis not present

## 2017-04-27 DIAGNOSIS — H353211 Exudative age-related macular degeneration, right eye, with active choroidal neovascularization: Secondary | ICD-10-CM | POA: Diagnosis not present

## 2017-04-28 DIAGNOSIS — D509 Iron deficiency anemia, unspecified: Secondary | ICD-10-CM | POA: Diagnosis not present

## 2017-04-28 DIAGNOSIS — N186 End stage renal disease: Secondary | ICD-10-CM | POA: Diagnosis not present

## 2017-04-28 DIAGNOSIS — N2581 Secondary hyperparathyroidism of renal origin: Secondary | ICD-10-CM | POA: Diagnosis not present

## 2017-04-28 DIAGNOSIS — D631 Anemia in chronic kidney disease: Secondary | ICD-10-CM | POA: Diagnosis not present

## 2017-04-29 DIAGNOSIS — H2101 Hyphema, right eye: Secondary | ICD-10-CM | POA: Diagnosis not present

## 2017-04-29 DIAGNOSIS — H33021 Retinal detachment with multiple breaks, right eye: Secondary | ICD-10-CM | POA: Diagnosis not present

## 2017-04-29 DIAGNOSIS — H35021 Exudative retinopathy, right eye: Secondary | ICD-10-CM | POA: Diagnosis not present

## 2017-04-29 DIAGNOSIS — H4311 Vitreous hemorrhage, right eye: Secondary | ICD-10-CM | POA: Diagnosis not present

## 2017-04-29 DIAGNOSIS — E113511 Type 2 diabetes mellitus with proliferative diabetic retinopathy with macular edema, right eye: Secondary | ICD-10-CM | POA: Diagnosis not present

## 2017-05-01 DIAGNOSIS — N186 End stage renal disease: Secondary | ICD-10-CM | POA: Diagnosis not present

## 2017-05-01 DIAGNOSIS — N2581 Secondary hyperparathyroidism of renal origin: Secondary | ICD-10-CM | POA: Diagnosis not present

## 2017-05-01 DIAGNOSIS — D631 Anemia in chronic kidney disease: Secondary | ICD-10-CM | POA: Diagnosis not present

## 2017-05-01 DIAGNOSIS — D509 Iron deficiency anemia, unspecified: Secondary | ICD-10-CM | POA: Diagnosis not present

## 2017-05-04 DIAGNOSIS — D509 Iron deficiency anemia, unspecified: Secondary | ICD-10-CM | POA: Diagnosis not present

## 2017-05-04 DIAGNOSIS — D631 Anemia in chronic kidney disease: Secondary | ICD-10-CM | POA: Diagnosis not present

## 2017-05-04 DIAGNOSIS — N2581 Secondary hyperparathyroidism of renal origin: Secondary | ICD-10-CM | POA: Diagnosis not present

## 2017-05-04 DIAGNOSIS — N186 End stage renal disease: Secondary | ICD-10-CM | POA: Diagnosis not present

## 2017-05-05 ENCOUNTER — Other Ambulatory Visit: Payer: Self-pay | Admitting: Ophthalmology

## 2017-05-05 ENCOUNTER — Encounter (HOSPITAL_COMMUNITY)
Admission: AD | Disposition: A | Discharge status: Home / Self Care | Payer: Self-pay | Source: Ambulatory Visit | Attending: Ophthalmology

## 2017-05-05 ENCOUNTER — Ambulatory Visit (HOSPITAL_COMMUNITY): Payer: Medicare Other | Admitting: Certified Registered Nurse Anesthetist

## 2017-05-05 ENCOUNTER — Ambulatory Visit (HOSPITAL_COMMUNITY)
Admission: AD | Admit: 2017-05-05 | Discharge: 2017-05-05 | Disposition: A | Discharge status: Home / Self Care | Payer: Medicare Other | Source: Ambulatory Visit | Attending: Ophthalmology | Admitting: Ophthalmology

## 2017-05-05 ENCOUNTER — Encounter (HOSPITAL_COMMUNITY): Payer: Self-pay | Admitting: General Practice

## 2017-05-05 DIAGNOSIS — F419 Anxiety disorder, unspecified: Secondary | ICD-10-CM | POA: Diagnosis not present

## 2017-05-05 DIAGNOSIS — H3561 Retinal hemorrhage, right eye: Secondary | ICD-10-CM | POA: Insufficient documentation

## 2017-05-05 DIAGNOSIS — D649 Anemia, unspecified: Secondary | ICD-10-CM | POA: Diagnosis not present

## 2017-05-05 DIAGNOSIS — Z6832 Body mass index (BMI) 32.0-32.9, adult: Secondary | ICD-10-CM | POA: Diagnosis not present

## 2017-05-05 DIAGNOSIS — H3560 Retinal hemorrhage, unspecified eye: Secondary | ICD-10-CM | POA: Diagnosis present

## 2017-05-05 DIAGNOSIS — K219 Gastro-esophageal reflux disease without esophagitis: Secondary | ICD-10-CM | POA: Insufficient documentation

## 2017-05-05 DIAGNOSIS — H40831 Aqueous misdirection, right eye: Secondary | ICD-10-CM | POA: Insufficient documentation

## 2017-05-05 DIAGNOSIS — I509 Heart failure, unspecified: Secondary | ICD-10-CM | POA: Insufficient documentation

## 2017-05-05 DIAGNOSIS — Z9001 Acquired absence of eye: Secondary | ICD-10-CM | POA: Insufficient documentation

## 2017-05-05 DIAGNOSIS — E669 Obesity, unspecified: Secondary | ICD-10-CM | POA: Insufficient documentation

## 2017-05-05 DIAGNOSIS — Z79899 Other long term (current) drug therapy: Secondary | ICD-10-CM | POA: Diagnosis not present

## 2017-05-05 DIAGNOSIS — M1990 Unspecified osteoarthritis, unspecified site: Secondary | ICD-10-CM | POA: Diagnosis not present

## 2017-05-05 DIAGNOSIS — I110 Hypertensive heart disease with heart failure: Secondary | ICD-10-CM | POA: Diagnosis not present

## 2017-05-05 DIAGNOSIS — H4051X0 Glaucoma secondary to other eye disorders, right eye, stage unspecified: Secondary | ICD-10-CM | POA: Diagnosis not present

## 2017-05-05 DIAGNOSIS — E11311 Type 2 diabetes mellitus with unspecified diabetic retinopathy with macular edema: Secondary | ICD-10-CM | POA: Diagnosis not present

## 2017-05-05 DIAGNOSIS — H4053X4 Glaucoma secondary to other eye disorders, bilateral, indeterminate stage: Secondary | ICD-10-CM | POA: Diagnosis not present

## 2017-05-05 DIAGNOSIS — H4050X0 Glaucoma secondary to other eye disorders, unspecified eye, stage unspecified: Secondary | ICD-10-CM | POA: Diagnosis not present

## 2017-05-05 DIAGNOSIS — R0602 Shortness of breath: Secondary | ICD-10-CM | POA: Diagnosis not present

## 2017-05-05 DIAGNOSIS — H35021 Exudative retinopathy, right eye: Secondary | ICD-10-CM | POA: Diagnosis not present

## 2017-05-05 HISTORY — PX: PARS PLANA VITRECTOMY: SHX2166

## 2017-05-05 LAB — POCT I-STAT 4, (NA,K, GLUC, HGB,HCT)
GLUCOSE: 178 mg/dL — AB (ref 65–99)
HCT: 41 % (ref 36.0–46.0)
Hemoglobin: 13.9 g/dL (ref 12.0–15.0)
POTASSIUM: 4.7 mmol/L (ref 3.5–5.1)
SODIUM: 141 mmol/L (ref 135–145)

## 2017-05-05 LAB — GLUCOSE, CAPILLARY
GLUCOSE-CAPILLARY: 168 mg/dL — AB (ref 65–99)
Glucose-Capillary: 186 mg/dL — ABNORMAL HIGH (ref 65–99)
Glucose-Capillary: 194 mg/dL — ABNORMAL HIGH (ref 65–99)
Glucose-Capillary: 183 mg/dL — ABNORMAL HIGH (ref 65–99)

## 2017-05-05 LAB — POTASSIUM: Potassium: 4.8 mmol/L (ref 3.5–5.1)

## 2017-05-05 SURGERY — PARS PLANA VITRECTOMY WITH 25 GAUGE
Anesthesia: General | Site: Eye | Laterality: Right

## 2017-05-05 MED ORDER — BSS PLUS IO SOLN
INTRAOCULAR | Status: AC
  Filled 2017-05-05: qty 500

## 2017-05-05 MED ORDER — GLYCOPYRROLATE 0.2 MG/ML IJ SOLN
INTRAMUSCULAR | Status: DC | PRN
  Administered 2017-05-05: .8 mg via INTRAVENOUS

## 2017-05-05 MED ORDER — BSS IO SOLN
INTRAOCULAR | Status: AC
  Filled 2017-05-05: qty 500

## 2017-05-05 MED ORDER — SODIUM CHLORIDE 0.9 % IV SOLN
INTRAVENOUS | Status: DC
  Administered 2017-05-05: 14:00:00 via INTRAVENOUS

## 2017-05-05 MED ORDER — LIDOCAINE HCL (PF) 2 % IJ SOLN
INTRAMUSCULAR | Status: AC
  Filled 2017-05-05: qty 10

## 2017-05-05 MED ORDER — ROCURONIUM BROMIDE 100 MG/10ML IV SOLN
INTRAVENOUS | Status: DC | PRN
  Administered 2017-05-05: 10 mg via INTRAVENOUS
  Administered 2017-05-05: 40 mg via INTRAVENOUS
  Administered 2017-05-05: 10 mg via INTRAVENOUS

## 2017-05-05 MED ORDER — BSS IO SOLN
INTRAOCULAR | Status: DC | PRN
  Administered 2017-05-05: 15 mL

## 2017-05-05 MED ORDER — LABETALOL HCL 5 MG/ML IV SOLN
5.0000 mg | Freq: Once | INTRAVENOUS | Status: AC
  Administered 2017-05-05: 5 mg via INTRAVENOUS

## 2017-05-05 MED ORDER — TETRACAINE HCL 0.5 % OP SOLN
OPHTHALMIC | Status: AC
  Filled 2017-05-05: qty 4

## 2017-05-05 MED ORDER — INDOCYANINE GREEN 25 MG IV SOLR
INTRAVENOUS | Status: AC
  Filled 2017-05-05: qty 25

## 2017-05-05 MED ORDER — NEOSTIGMINE METHYLSULFATE 10 MG/10ML IV SOLN
INTRAVENOUS | Status: DC | PRN
  Administered 2017-05-05: 4 mg via INTRAVENOUS

## 2017-05-05 MED ORDER — ONDANSETRON HCL 4 MG/2ML IJ SOLN
4.0000 mg | Freq: Once | INTRAMUSCULAR | Status: AC
  Administered 2017-05-05: 4 mg via INTRAVENOUS
  Filled 2017-05-05: qty 2

## 2017-05-05 MED ORDER — PHENYLEPHRINE HCL 2.5 % OP SOLN
1.0000 [drp] | OPHTHALMIC | Status: AC | PRN
  Administered 2017-05-05 (×3): 1 [drp] via OPHTHALMIC

## 2017-05-05 MED ORDER — PROPOFOL 10 MG/ML IV BOLUS
INTRAVENOUS | Status: DC | PRN
  Administered 2017-05-05: 150 mg via INTRAVENOUS

## 2017-05-05 MED ORDER — NA CHONDROIT SULF-NA HYALURON 40-30 MG/ML IO SOLN
INTRAOCULAR | Status: AC
  Filled 2017-05-05: qty 1

## 2017-05-05 MED ORDER — POLYMYXIN B SULFATE 500000 UNITS IJ SOLR
INTRAMUSCULAR | Status: AC
  Filled 2017-05-05: qty 500000

## 2017-05-05 MED ORDER — CARVEDILOL 12.5 MG PO TABS
12.5000 mg | ORAL_TABLET | Freq: Two times a day (BID) | ORAL | Status: DC
  Administered 2017-05-05: 12.5 mg via ORAL
  Filled 2017-05-05: qty 1

## 2017-05-05 MED ORDER — HYPROMELLOSE (GONIOSCOPIC) 2.5 % OP SOLN
OPHTHALMIC | Status: AC
  Filled 2017-05-05: qty 15

## 2017-05-05 MED ORDER — ACETAMINOPHEN 10 MG/ML IV SOLN
1000.0000 mg | Freq: Once | INTRAVENOUS | Status: AC
  Administered 2017-05-05: 1000 mg via INTRAVENOUS

## 2017-05-05 MED ORDER — ONDANSETRON HCL 4 MG/2ML IJ SOLN: 4.0000 mg | Freq: Once | INTRAMUSCULAR | Status: DC | PRN

## 2017-05-05 MED ORDER — ONDANSETRON HCL 4 MG/2ML IJ SOLN
INTRAMUSCULAR | Status: DC | PRN
  Administered 2017-05-05: 4 mg via INTRAVENOUS

## 2017-05-05 MED ORDER — GATIFLOXACIN 0.5 % OP SOLN
OPHTHALMIC | Status: AC
  Administered 2017-05-05: 1 [drp] via OPHTHALMIC
  Filled 2017-05-05: qty 2.5

## 2017-05-05 MED ORDER — DEXAMETHASONE SODIUM PHOSPHATE 10 MG/ML IJ SOLN
INTRAMUSCULAR | Status: DC | PRN
  Administered 2017-05-05: 10 mg

## 2017-05-05 MED ORDER — DEXAMETHASONE SODIUM PHOSPHATE 10 MG/ML IJ SOLN
INTRAMUSCULAR | Status: AC
  Filled 2017-05-05: qty 1

## 2017-05-05 MED ORDER — LABETALOL HCL 5 MG/ML IV SOLN
INTRAVENOUS | Status: AC
  Administered 2017-05-05: 5 mg via INTRAVENOUS
  Filled 2017-05-05: qty 4

## 2017-05-05 MED ORDER — BACITRACIN-POLYMYXIN B 500-10000 UNIT/GM OP OINT: TOPICAL_OINTMENT | OPHTHALMIC | Status: DC | PRN

## 2017-05-05 MED ORDER — FENTANYL CITRATE (PF) 100 MCG/2ML IJ SOLN: 25.0000 ug | INTRAMUSCULAR | Status: DC | PRN

## 2017-05-05 MED ORDER — GENTAMICIN SULFATE 40 MG/ML IJ SOLN
INTRAMUSCULAR | Status: AC
  Filled 2017-05-05: qty 2

## 2017-05-05 MED ORDER — PHENYLEPHRINE HCL 2.5 % OP SOLN
OPHTHALMIC | Status: AC
  Administered 2017-05-05: 1 [drp] via OPHTHALMIC
  Filled 2017-05-05: qty 2

## 2017-05-05 MED ORDER — TOBRAMYCIN-DEXAMETHASONE 0.3-0.1 % OP OINT
TOPICAL_OINTMENT | OPHTHALMIC | Status: AC
  Filled 2017-05-05: qty 3.5

## 2017-05-05 MED ORDER — PROPOFOL 10 MG/ML IV BOLUS
INTRAVENOUS | Status: AC
  Filled 2017-05-05: qty 20

## 2017-05-05 MED ORDER — LABETALOL HCL 5 MG/ML IV SOLN
INTRAVENOUS | Status: DC | PRN
  Administered 2017-05-05 (×2): 5 mg via INTRAVENOUS

## 2017-05-05 MED ORDER — LACTATED RINGERS IV SOLN
INTRAVENOUS | Status: DC | PRN
  Administered 2017-05-05: 19:00:00 via INTRAVENOUS

## 2017-05-05 MED ORDER — HYDRALAZINE HCL 20 MG/ML IJ SOLN
10.0000 mg | Freq: Once | INTRAMUSCULAR | Status: AC
  Administered 2017-05-05: 10 mg via INTRAVENOUS
  Filled 2017-05-05: qty 0.5
  Filled 2017-05-05: qty 1

## 2017-05-05 MED ORDER — FENTANYL CITRATE (PF) 100 MCG/2ML IJ SOLN
INTRAMUSCULAR | Status: DC | PRN
  Administered 2017-05-05: 100 ug via INTRAVENOUS

## 2017-05-05 MED ORDER — FENTANYL CITRATE (PF) 100 MCG/2ML IJ SOLN
50.0000 ug | Freq: Once | INTRAMUSCULAR | Status: AC
  Administered 2017-05-05: 50 ug via INTRAVENOUS
  Filled 2017-05-05: qty 1

## 2017-05-05 MED ORDER — GATIFLOXACIN 0.5 % OP SOLN
1.0000 [drp] | OPHTHALMIC | Status: AC | PRN
  Administered 2017-05-05 (×3): 1 [drp] via OPHTHALMIC

## 2017-05-05 MED ORDER — HYPROMELLOSE (GONIOSCOPIC) 2.5 % OP SOLN: OPHTHALMIC | Status: DC | PRN

## 2017-05-05 MED ORDER — LIDOCAINE HCL (CARDIAC) 20 MG/ML IV SOLN
INTRAVENOUS | Status: DC | PRN
  Administered 2017-05-05: 80 mg via INTRAVENOUS

## 2017-05-05 MED ORDER — MIDAZOLAM HCL 2 MG/2ML IJ SOLN
INTRAMUSCULAR | Status: AC
  Filled 2017-05-05: qty 2

## 2017-05-05 MED ORDER — EPINEPHRINE PF 1 MG/ML IJ SOLN
INTRAMUSCULAR | Status: DC | PRN
Start: 2017-05-05 — End: 2017-05-05
  Administered 2017-05-05: .3 mL

## 2017-05-05 MED ORDER — BACITRACIN-POLYMYXIN B 500-10000 UNIT/GM OP OINT
TOPICAL_OINTMENT | OPHTHALMIC | Status: AC
  Filled 2017-05-05: qty 3.5

## 2017-05-05 MED ORDER — CLONIDINE HCL 0.1 MG PO TABS
0.1000 mg | ORAL_TABLET | Freq: Two times a day (BID) | ORAL | Status: DC
  Administered 2017-05-05: 0.1 mg via ORAL
  Filled 2017-05-05: qty 1

## 2017-05-05 MED ORDER — CYCLOPENTOLATE HCL 1 % OP SOLN
1.0000 [drp] | OPHTHALMIC | Status: AC | PRN
  Administered 2017-05-05 (×3): 1 [drp] via OPHTHALMIC

## 2017-05-05 MED ORDER — EPINEPHRINE PF 1 MG/ML IJ SOLN
INTRAMUSCULAR | Status: AC
  Filled 2017-05-05: qty 1

## 2017-05-05 MED ORDER — NA CHONDROIT SULF-NA HYALURON 40-30 MG/ML IO SOLN
INTRAOCULAR | Status: DC | PRN
  Administered 2017-05-05: 0.5 mL via INTRAOCULAR

## 2017-05-05 MED ORDER — FENTANYL CITRATE (PF) 100 MCG/2ML IJ SOLN
50.0000 ug | Freq: Once | INTRAMUSCULAR | Status: AC
  Administered 2017-05-05: 50 ug via INTRAVENOUS

## 2017-05-05 MED ORDER — FENTANYL CITRATE (PF) 100 MCG/2ML IJ SOLN
INTRAMUSCULAR | Status: AC
  Administered 2017-05-05: 50 ug via INTRAVENOUS
  Filled 2017-05-05: qty 2

## 2017-05-05 MED ORDER — ACETAMINOPHEN 10 MG/ML IV SOLN
INTRAVENOUS | Status: AC
  Administered 2017-05-05: 1000 mg via INTRAVENOUS
  Filled 2017-05-05: qty 100

## 2017-05-05 MED ORDER — ONDANSETRON HCL 4 MG/2ML IJ SOLN
INTRAMUSCULAR | Status: AC
  Administered 2017-05-05: 4 mg via INTRAVENOUS
  Filled 2017-05-05: qty 2

## 2017-05-05 MED ORDER — FENTANYL CITRATE (PF) 250 MCG/5ML IJ SOLN
INTRAMUSCULAR | Status: AC
  Filled 2017-05-05: qty 5

## 2017-05-05 MED ORDER — LABETALOL HCL 5 MG/ML IV SOLN
5.0000 mg | Freq: Once | INTRAVENOUS | Status: AC
  Administered 2017-05-05: 5 mg via INTRAVENOUS
  Filled 2017-05-05: qty 4

## 2017-05-05 MED ORDER — MIDAZOLAM HCL 5 MG/5ML IJ SOLN
INTRAMUSCULAR | Status: DC | PRN
  Administered 2017-05-05: 2 mg via INTRAVENOUS

## 2017-05-05 MED ORDER — CYCLOPENTOLATE HCL 1 % OP SOLN
OPHTHALMIC | Status: AC
  Administered 2017-05-05: 1 [drp] via OPHTHALMIC
  Filled 2017-05-05: qty 2

## 2017-05-05 MED ORDER — DEXAMETHASONE SODIUM PHOSPHATE 4 MG/ML IJ SOLN
INTRAMUSCULAR | Status: DC | PRN
  Administered 2017-05-05: 8 mg via INTRAVENOUS

## 2017-05-05 MED ORDER — BSS PLUS IO SOLN
INTRAOCULAR | Status: DC | PRN
  Administered 2017-05-05: 1

## 2017-05-05 SURGICAL SUPPLY — 58 items
APL SRG 3 HI ABS STRL LF PLS (MISCELLANEOUS)
APPLICATOR COTTON TIP 6IN STRL (MISCELLANEOUS) ×3 IMPLANT
APPLICATOR DR MATTHEWS STRL (MISCELLANEOUS) IMPLANT
BLADE MVR KNIFE 20G (BLADE) ×3 IMPLANT
CANNULA ANT CHAM MAIN (OPHTHALMIC RELATED) ×2 IMPLANT
CANNULA VLV SOFT TIP 25GA (OPHTHALMIC) ×6 IMPLANT
CORDS BIPOLAR (ELECTRODE) IMPLANT
COVER MAYO STAND STRL (DRAPES) ×2 IMPLANT
DRAPE INCISE 51X51 W/FILM STRL (DRAPES) IMPLANT
DRAPE OPHTHALMIC 77X100 STRL (CUSTOM PROCEDURE TRAY) ×3 IMPLANT
FILTER BLUE MILLIPORE (MISCELLANEOUS) IMPLANT
FORCEPS ECKARDT ILM 25G SERR (OPHTHALMIC RELATED) IMPLANT
FORCEPS GRIESHABER ILM 25G A (INSTRUMENTS) IMPLANT
FORCEPS HORIZONTAL 25G DISP (OPHTHALMIC RELATED) IMPLANT
GAS AUTO FILL CONSTEL (OPHTHALMIC)
GAS AUTO FILL CONSTELLATION (OPHTHALMIC) IMPLANT
GLOVE SS BIOGEL STRL SZ 8.5 (GLOVE) ×1 IMPLANT
GLOVE SUPERSENSE BIOGEL SZ 8.5 (GLOVE) ×2
GOWN STRL REUS W/ TWL LRG LVL3 (GOWN DISPOSABLE) ×1 IMPLANT
GOWN STRL REUS W/ TWL XL LVL3 (GOWN DISPOSABLE) ×1 IMPLANT
GOWN STRL REUS W/TWL LRG LVL3 (GOWN DISPOSABLE) ×3
GOWN STRL REUS W/TWL XL LVL3 (GOWN DISPOSABLE) ×3
KIT BASIN OR (CUSTOM PROCEDURE TRAY) ×3 IMPLANT
KNIFE CRESCENT 2.5 55 ANG (BLADE) IMPLANT
LENS BIOM SUPER VIEW SET DISP (OPHTHALMIC RELATED) ×6 IMPLANT
MICROPICK 25G (MISCELLANEOUS)
NDL 18GX1X1/2 (RX/OR ONLY) (NEEDLE) IMPLANT
NDL 25GX 5/8IN NON SAFETY (NEEDLE) IMPLANT
NDL FILTER BLUNT 18X1 1/2 (NEEDLE) IMPLANT
NEEDLE 18GX1X1/2 (RX/OR ONLY) (NEEDLE) IMPLANT
NEEDLE 25GX 5/8IN NON SAFETY (NEEDLE) IMPLANT
NEEDLE FILTER BLUNT 18X 1/2SAF (NEEDLE)
NEEDLE FILTER BLUNT 18X1 1/2 (NEEDLE) IMPLANT
NEEDLE HYPO 25GX1X1/2 BEV (NEEDLE) IMPLANT
NS IRRIG 1000ML POUR BTL (IV SOLUTION) ×3 IMPLANT
PACK FRAGMATOME (OPHTHALMIC) IMPLANT
PACK VITRECTOMY CUSTOM (CUSTOM PROCEDURE TRAY) ×3 IMPLANT
PAD ARMBOARD 7.5X6 YLW CONV (MISCELLANEOUS) ×6 IMPLANT
PAK PIK VITRECTOMY CVS 25GA (OPHTHALMIC) ×3 IMPLANT
PENCIL BIPOLAR 25GA STR DISP (OPHTHALMIC RELATED) IMPLANT
PICK MICROPICK 25G (MISCELLANEOUS) IMPLANT
PROBE LASER ILLUM FLEX CVD 25G (OPHTHALMIC) IMPLANT
ROLLS DENTAL (MISCELLANEOUS) IMPLANT
SCRAPER DIAMOND 25GA (OPHTHALMIC RELATED) IMPLANT
SET INJECTOR OIL FLUID CONSTEL (OPHTHALMIC) ×4 IMPLANT
SOLUTION ANTI FOG 6CC (MISCELLANEOUS) ×2 IMPLANT
STOCKINETTE IMPERVIOUS 9X36 MD (GAUZE/BANDAGES/DRESSINGS) ×6 IMPLANT
STOPCOCK 4 WAY LG BORE MALE ST (IV SETS) IMPLANT
SUT ETHILON 10 0 CS140 6 (SUTURE) IMPLANT
SUT ETHILON 8 0 BV130 4 (SUTURE) IMPLANT
SUT MERSILENE 5 0 RD 1 DA (SUTURE) IMPLANT
SUT PROLENE 10 0 CIF 4 DA (SUTURE) IMPLANT
SUT VICRYL 7 0 TG140 8 (SUTURE) IMPLANT
SYR 30ML SLIP (SYRINGE) IMPLANT
SYR 5ML LL (SYRINGE) IMPLANT
SYR TB 1ML LUER SLIP (SYRINGE) IMPLANT
WATER STERILE IRR 1000ML POUR (IV SOLUTION) ×3 IMPLANT
WIPE INSTRUMENT VISIWIPE 73X73 (MISCELLANEOUS) ×2 IMPLANT

## 2017-05-05 NOTE — Anesthesia Procedure Notes (Signed)
Procedure Name: Intubation Date/Time: 05/05/2017 6:46 PM Performed by: Oletta Lamas Pre-anesthesia Checklist: Patient identified, Emergency Drugs available, Suction available and Patient being monitored Patient Re-evaluated:Patient Re-evaluated prior to induction Oxygen Delivery Method: Circle System Utilized Preoxygenation: Pre-oxygenation with 100% oxygen Induction Type: IV induction Ventilation: Mask ventilation without difficulty Laryngoscope Size: Mac and 3 Grade View: Grade I Tube type: Oral Number of attempts: 1 Airway Equipment and Method: Stylet and Oral airway Placement Confirmation: ETT inserted through vocal cords under direct vision,  positive ETCO2 and breath sounds checked- equal and bilateral Secured at: 22 cm Tube secured with: Tape Dental Injury: Teeth and Oropharynx as per pre-operative assessment

## 2017-05-05 NOTE — Progress Notes (Signed)
Dr. Linna Caprice is aware of patient's continued hypertension.

## 2017-05-05 NOTE — Progress Notes (Signed)
   05/05/17 1600  Vitals  Pulse Rate 73  ECG Heart Rate 75  Resp (!) 27  BP (!) 203/84  SpO2 93 %  Oxygen Therapy  O2 Device Nasal Cannula  O2 Flow Rate (L/min) 2 L/min  Dr. Linna Caprice at bedside

## 2017-05-05 NOTE — Progress Notes (Signed)
   05/05/17 1540  Vitals  Pulse Rate 71  ECG Heart Rate 73  Resp (!) 22  BP (!) 222/80  SpO2 92 %   Dr. Royce Macadamia made aware of VS.
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
## 2017-05-05 NOTE — Anesthesia Preprocedure Evaluation (Signed)
Anesthesia Evaluation  Patient identified by MRN, date of birth, ID band Patient awake    Reviewed: Allergy & Precautions, NPO status , Patient's Chart, lab work & pertinent test results, reviewed documented beta blocker date and time   Airway Mallampati: III  TM Distance: >3 FB Neck ROM: Full    Dental no notable dental hx. (+) Teeth Intact   Pulmonary shortness of breath and with exertion,    Pulmonary exam normal breath sounds clear to auscultation       Cardiovascular hypertension, Pt. on medications and Pt. on home beta blockers +CHF  Normal cardiovascular exam Rhythm:Regular Rate:Normal     Neuro/Psych Anxiety Neuropathy For [place] vitrectomy and partial removal of oil    GI/Hepatic Neg liver ROS, GERD  Medicated and Controlled,  Endo/Other  diabetes, Poorly Controlled, Type 2, Oral Hypoglycemic Agents  Renal/GU ESRF and DialysisRenal disease  negative genitourinary   Musculoskeletal  (+) Arthritis , Osteoarthritis,    Abdominal (+) + obese,   Peds  Hematology  (+) anemia ,   Anesthesia Other Findings   Reproductive/Obstetrics                             Anesthesia Physical Anesthesia Plan  ASA: IV  Anesthesia Plan: General   Post-op Pain Management:    Induction: Intravenous  PONV Risk Score and Plan: 4 or greater and Ondansetron, Midazolam, Propofol infusion, Promethazine and Treatment may vary due to age or medical condition  Airway Management Planned: Oral ETT and Natural Airway  Additional Equipment:   Intra-op Plan:   Post-operative Plan: Extubation in OR  Informed Consent: I have reviewed the patients History and Physical, chart, labs and discussed the procedure including the risks, benefits and alternatives for the proposed anesthesia with the patient or authorized representative who has indicated his/her understanding and acceptance.   Dental advisory  given  Plan Discussed with: CRNA, Anesthesiologist and Surgeon  Anesthesia Plan Comments:         Anesthesia Quick Evaluation

## 2017-05-05 NOTE — Progress Notes (Addendum)
   05/05/17 1630  Vitals  Pulse Rate 73  ECG Heart Rate 75  Resp 17  BP (!) 224/86  SpO2 100 %  Oxygen Therapy  O2 Device Nasal Cannula  O2 Flow Rate (L/min) 2 L/min   No facial droop, = grip strength, = flexion/ extension in lower extremities, no slurred speech.

## 2017-05-05 NOTE — Progress Notes (Signed)
Report from L. Granville Lewis, Therapist, sports. Patient sinus rhythm on monitor, 93- 96% on 2 lpm. = grip strength, = flexion/ extension in lower extremities, no facial droop.

## 2017-05-05 NOTE — Transfer of Care (Signed)
Immediate Anesthesia Transfer of Care Note  Patient: [name]  Procedure(s) Performed: PARS PLANA VITRECTOMY WITH 25 GAUGE; PARTIAL REMOVAL OF OIL; INFERIOR PERIPHERAL IRIDECTOMY, REFORM ANTERIOR CHAMBER RIGHT EYE (Right Eye)  Patient Location: PACU  Anesthesia Type:General  Level of Consciousness: awake, oriented, drowsy and patient cooperative  Airway & Oxygen Therapy: [name]  Post-op Assessment: Report given to RN and Post -op Vital signs reviewed and stable  Post vital signs: Reviewed and stable  Last Vitals:  Vitals:   05/05/17 1830 05/05/17 2007  BP: (!) 232/85 (!) 186/86  Pulse: 79 78  Resp: (!) 26 17  Temp:  36.6 C  SpO2: 100% 94%    Last Pain:  Vitals:   05/05/17 2007  TempSrc:   PainSc: 0-No pain      Patients Stated Pain Goal: 4 (56/81/27 5170)  Complications: No apparent anesthesia complications   Verbalizes SOB.  HOB elevated >30 degrees.  Lungs clear.  Equal bilateral. Denies pain.  Holding head up. Saturation 96% on 3L [place].  Discussed with Dr. Kalman Shan.

## 2017-05-05 NOTE — Brief Op Note (Signed)
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  Gen. Anesthesia  Complications none

## 2017-05-06 ENCOUNTER — Encounter (HOSPITAL_COMMUNITY): Payer: Self-pay | Admitting: Ophthalmology

## 2017-05-06 NOTE — Op Note (Signed)
NAMEKINGSTON, [name]              ACCOUNT NO.:  [id number]  MEDICAL RECORD NO.:  [id number]  LOCATION:                                 FACILITY:  PHYSICIAN:  Clent Demark. Rankin, M.D.        DATE OF BIRTH:  DATE OF PROCEDURE:  05/05/2017 DATE OF DISCHARGE:                              OPERATIVE REPORT   PREOPERATIVE DIAGNOSES: 1. Malignant glaucoma of right eye, with shallow anterior chamber     centrally and peripherally of right eye. 2. 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 3. Peripheral exudative retinopathy and advanced diabetic retinopathy     of the right eye [facility]).  POSTOPERATIVE DIAGNOSES: 1. Malignant glaucoma of right eye, with shallow anterior chamber     centrally and peripherally of right eye. 2. 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 3. Peripheral exudative retinopathy and advanced diabetic retinopathy     of the right eye [facility]).  OPERATIONS PERFORMED: 1. Vitrectomy of right eye. 2. Partial removal of silicone oil - vitreous substitute - 2 mL via     vitreous fluid extraction techniques, 25-gauge. 3. Reformation of the anterior chamber in right eye via paracentesis     and injection of Viscoat. 4. Inferior peripheral keyhole iridectomy of right eye.  ANESTHESIA:  General endotracheal anesthesia.  INDICATIONS FOR PROCEDURE:  The patient is a monocular patient, who developed massive subretinal, subretinal pigment epithelial exudative retinopathy above and beyond typical classic proliferative diabetic retinopathy, which has been quiescent.  These findings are seen in folks with uncontrolled hypertension, blood  coagulopathies, and as well as hereditary conditions.  This is known as peripheral exudative Chorioretinopathy, made worse in this case by the fact that she has underlying proliferative diabetic retinopathy, vascular disease, and hypertension that are at times uncontrolled with thrice weekly renal dialysis.  She has lost her left eye in a similar fashion in the distant years past, and has developed this similar condition within the last 3 years, which was controlled initially with anti-VEGF medications intravitreally.  Some 2 weeks prior, patient developed profound vision loss on the basis of spontaneous subretinal choroidal hemorrhages and subretinal pigment epithelial hemorrhages in a multilobular circumferential fashion. Intravitreal Avastin was injected promptly.  Unfortunately, this was complicated by development of vitreous hemorrhage over the subsequent 2 days, which required surgical intervention.  Surgical intervention cleared the vitreous hemorrhage nicely, and injection of silicone oil was applied in an attempt to prevent migration of the hemorrhage into the vitreous cavity, stabilize this monocular vision, and prevent further progression of disease.  She was found at that initial surgery to have a significant subretinal hemorrhage, now encroaching upon the macular region with a guarded prognosis for vision.   She did well for probably 6 days, and on the morning of today, earlier this morning on May 05, 2017, awakened with  severe pain, and found to have malignant glaucoma with anterior pressure shallowing the central anterior chamber as well as peripheral anterior chamber angle into an angled closure configuration with malignant glaucoma.  This requires surgical intervention to fix the inciting cause.  This was done today.  The patient and the family understand the guarded prognosis for the globe and retention of vision.  She understands that attempt has been made to  partially remove the silicone oil, so as to help prevent and to allow the aqueous segment of her intra-ocular fluids to reform and to maintain formation in the anterior chamber.  She understands that there is need to create an inferior peripheral iridectomy as well as to leave some of the silicone oil in there and maintaining the original goal of preventing a complete vitreous hemorrhage obscuring total vision.  She understands the severe nature of her underlying disease, and that this is the last technique to salvage the globe and to protect the intra- ocular pressures.  The patient and the family understand and wish to proceed.  The appropriate signed consent was obtained.  DESCRIPTION OF PROCEDURE IN DETAIL:  The patient was taken to the operating room.  In the operating room, preoperative monitoring was followed by mild sedation.  General anesthesia was instituted without difficulty.  The right periocular region was identified with staff and surgeon.  Thereafter, this was sterilely prepped and draped in the usual ophthalmic fashion.  A lid speculum was applied.  A 25-gauge trocar was placed in the inferotemporal quadrant, placed and verified visually, and infusion turned on a 25-mm infusion.  A second trocar 25-gauge was placed in the superotemporal quadrant.  Placement was verified visually.  I decompressed the anterior chamber by doing vitreous fluid extraction of the silicone oil, approximately 0.50 mL to 0.75 mL initially.  Lower effusion pressures did not completely re-form the anterior chamber.  A paracentesis incision was now made with a 20- gauge MVR blade.  The anterior chamber was deepened again with Viscoat. A surgical peripheral iridectomy was then created at the 6 o'clock position, and I allowed this to be a keyhole to minimize the chance that this will close over.  It was at this time that a knuckle of silicone oil was seen to bubble from the inferotemporal  dehiscence in the peripheral posterior capsule and around the intraocular lens.  This confirmed the need to remove at least half of the silicone oil, so that this would limit the migration into the anterior chamber, but moreover, still allow should migration occur, that fluid phase of the intravitreal contents would be able to migrate to the anterior chamber and out the inferior peripheral iridectomy.  I subsequently continued the [facility] extraction, removing a full 2 mL of silicone oil.  The anterior chamber maintained its form.  The anterior chamber opened and deepened.  Intraocular pressure was maintained at 20/25 mm.  At this time, the superior trocar was removed, and the wound was secured.  Similarly, the inferotemporal infusion trocar was removed. Subconjunctival Decadron was applied.  A sterile patch and Fox shield were applied.  CONDITION:  The patient tolerated the procedure well without complications and was taken to the PACU.  PLAN:  The patient will be discharged home hopefully today as an outpatient.     Clent Demark Rankin, M.D.   ______________________________ Clent Demark. Rankin, M.D.    GAR/MEDQ  D:  05/05/2017  T:  05/06/2017  Job:  [id number]

## 2017-05-07 LAB — POCT I-STAT 4, (NA,K, GLUC, HGB,HCT)
Glucose, Bld: 182 mg/dL — ABNORMAL HIGH (ref 65–99)
HCT: 39 % (ref 36.0–46.0)
Hemoglobin: 13.3 g/dL (ref 12.0–15.0)
SODIUM: 129 mmol/L — AB (ref 135–145)

## 2017-05-07 NOTE — Anesthesia Postprocedure Evaluation (Signed)
Anesthesia Post Note  Patient: [name]  Procedure(s) Performed: PARS PLANA VITRECTOMY WITH 25 GAUGE; PARTIAL REMOVAL OF OIL; INFERIOR PERIPHERAL IRIDECTOMY, REFORM ANTERIOR CHAMBER RIGHT EYE (Right Eye)     Patient location during evaluation: PACU Anesthesia Type: General Level of consciousness: awake and alert Pain management: pain level controlled Vital Signs Assessment: post-procedure vital signs reviewed and stable Respiratory status: spontaneous breathing, nonlabored ventilation, respiratory function stable and patient connected to nasal cannula oxygen Cardiovascular status: blood pressure returned to baseline and stable Postop Assessment: no apparent nausea or vomiting Anesthetic complications: no    Last Vitals:  Vitals:   05/05/17 2022 05/05/17 2037  BP: (!) 187/82 (!) 189/82  Pulse: 74 74  Resp: 20 20  Temp:  36.6 C  SpO2: 94% 94%    Last Pain:  Vitals:   05/05/17 2037  TempSrc:   PainSc: 0-No pain                 Lamont Tant S

## 2017-05-08 DIAGNOSIS — N186 End stage renal disease: Secondary | ICD-10-CM | POA: Diagnosis not present

## 2017-05-08 DIAGNOSIS — D631 Anemia in chronic kidney disease: Secondary | ICD-10-CM | POA: Diagnosis not present

## 2017-05-08 DIAGNOSIS — D509 Iron deficiency anemia, unspecified: Secondary | ICD-10-CM | POA: Diagnosis not present

## 2017-05-08 DIAGNOSIS — N2581 Secondary hyperparathyroidism of renal origin: Secondary | ICD-10-CM | POA: Diagnosis not present

## 2017-05-11 DIAGNOSIS — N186 End stage renal disease: Secondary | ICD-10-CM | POA: Diagnosis not present

## 2017-05-11 DIAGNOSIS — D509 Iron deficiency anemia, unspecified: Secondary | ICD-10-CM | POA: Diagnosis not present

## 2017-05-11 DIAGNOSIS — D631 Anemia in chronic kidney disease: Secondary | ICD-10-CM | POA: Diagnosis not present

## 2017-05-11 DIAGNOSIS — N2581 Secondary hyperparathyroidism of renal origin: Secondary | ICD-10-CM | POA: Diagnosis not present

## 2017-05-13 DIAGNOSIS — N186 End stage renal disease: Secondary | ICD-10-CM | POA: Diagnosis not present

## 2017-05-13 DIAGNOSIS — N2581 Secondary hyperparathyroidism of renal origin: Secondary | ICD-10-CM | POA: Diagnosis not present

## 2017-05-13 DIAGNOSIS — D509 Iron deficiency anemia, unspecified: Secondary | ICD-10-CM | POA: Diagnosis not present

## 2017-05-13 DIAGNOSIS — E1142 Type 2 diabetes mellitus with diabetic polyneuropathy: Secondary | ICD-10-CM | POA: Diagnosis not present

## 2017-05-13 DIAGNOSIS — D631 Anemia in chronic kidney disease: Secondary | ICD-10-CM | POA: Diagnosis not present

## 2017-05-15 DIAGNOSIS — N2581 Secondary hyperparathyroidism of renal origin: Secondary | ICD-10-CM | POA: Diagnosis not present

## 2017-05-15 DIAGNOSIS — D631 Anemia in chronic kidney disease: Secondary | ICD-10-CM | POA: Diagnosis not present

## 2017-05-15 DIAGNOSIS — N186 End stage renal disease: Secondary | ICD-10-CM | POA: Diagnosis not present

## 2017-05-15 DIAGNOSIS — D509 Iron deficiency anemia, unspecified: Secondary | ICD-10-CM | POA: Diagnosis not present

## 2017-05-18 DIAGNOSIS — D631 Anemia in chronic kidney disease: Secondary | ICD-10-CM | POA: Diagnosis not present

## 2017-05-18 DIAGNOSIS — N2581 Secondary hyperparathyroidism of renal origin: Secondary | ICD-10-CM | POA: Diagnosis not present

## 2017-05-18 DIAGNOSIS — N186 End stage renal disease: Secondary | ICD-10-CM | POA: Diagnosis not present

## 2017-05-18 DIAGNOSIS — D509 Iron deficiency anemia, unspecified: Secondary | ICD-10-CM | POA: Diagnosis not present

## 2017-05-20 DIAGNOSIS — D631 Anemia in chronic kidney disease: Secondary | ICD-10-CM | POA: Diagnosis not present

## 2017-05-20 DIAGNOSIS — N186 End stage renal disease: Secondary | ICD-10-CM | POA: Diagnosis not present

## 2017-05-20 DIAGNOSIS — Z992 Dependence on renal dialysis: Secondary | ICD-10-CM | POA: Diagnosis not present

## 2017-05-20 DIAGNOSIS — N2581 Secondary hyperparathyroidism of renal origin: Secondary | ICD-10-CM | POA: Diagnosis not present

## 2017-05-20 DIAGNOSIS — D509 Iron deficiency anemia, unspecified: Secondary | ICD-10-CM | POA: Diagnosis not present

## 2017-05-20 DIAGNOSIS — E1129 Type 2 diabetes mellitus with other diabetic kidney complication: Secondary | ICD-10-CM | POA: Diagnosis not present

## 2017-05-21 DIAGNOSIS — Z09 Encounter for follow-up examination after completed treatment for conditions other than malignant neoplasm: Secondary | ICD-10-CM | POA: Diagnosis not present

## 2017-05-21 DIAGNOSIS — H35021 Exudative retinopathy, right eye: Secondary | ICD-10-CM | POA: Diagnosis not present

## 2017-05-21 DIAGNOSIS — H4053X4 Glaucoma secondary to other eye disorders, bilateral, indeterminate stage: Secondary | ICD-10-CM | POA: Diagnosis not present

## 2017-05-21 DIAGNOSIS — H4311 Vitreous hemorrhage, right eye: Secondary | ICD-10-CM | POA: Diagnosis not present

## 2017-05-22 DIAGNOSIS — D509 Iron deficiency anemia, unspecified: Secondary | ICD-10-CM | POA: Diagnosis not present

## 2017-05-22 DIAGNOSIS — N2581 Secondary hyperparathyroidism of renal origin: Secondary | ICD-10-CM | POA: Diagnosis not present

## 2017-05-22 DIAGNOSIS — E1129 Type 2 diabetes mellitus with other diabetic kidney complication: Secondary | ICD-10-CM | POA: Diagnosis not present

## 2017-05-22 DIAGNOSIS — D631 Anemia in chronic kidney disease: Secondary | ICD-10-CM | POA: Diagnosis not present

## 2017-05-22 DIAGNOSIS — N186 End stage renal disease: Secondary | ICD-10-CM | POA: Diagnosis not present

## 2017-05-25 DIAGNOSIS — N2581 Secondary hyperparathyroidism of renal origin: Secondary | ICD-10-CM | POA: Diagnosis not present

## 2017-05-25 DIAGNOSIS — E1129 Type 2 diabetes mellitus with other diabetic kidney complication: Secondary | ICD-10-CM | POA: Diagnosis not present

## 2017-05-25 DIAGNOSIS — N186 End stage renal disease: Secondary | ICD-10-CM | POA: Diagnosis not present

## 2017-05-25 DIAGNOSIS — H4053X4 Glaucoma secondary to other eye disorders, bilateral, indeterminate stage: Secondary | ICD-10-CM | POA: Diagnosis not present

## 2017-05-25 DIAGNOSIS — D509 Iron deficiency anemia, unspecified: Secondary | ICD-10-CM | POA: Diagnosis not present

## 2017-05-25 DIAGNOSIS — D631 Anemia in chronic kidney disease: Secondary | ICD-10-CM | POA: Diagnosis not present

## 2017-05-27 DIAGNOSIS — D631 Anemia in chronic kidney disease: Secondary | ICD-10-CM | POA: Diagnosis not present

## 2017-05-27 DIAGNOSIS — N2581 Secondary hyperparathyroidism of renal origin: Secondary | ICD-10-CM | POA: Diagnosis not present

## 2017-05-27 DIAGNOSIS — D509 Iron deficiency anemia, unspecified: Secondary | ICD-10-CM | POA: Diagnosis not present

## 2017-05-27 DIAGNOSIS — E1129 Type 2 diabetes mellitus with other diabetic kidney complication: Secondary | ICD-10-CM | POA: Diagnosis not present

## 2017-05-27 DIAGNOSIS — N186 End stage renal disease: Secondary | ICD-10-CM | POA: Diagnosis not present

## 2017-05-28 DIAGNOSIS — H4050X0 Glaucoma secondary to other eye disorders, unspecified eye, stage unspecified: Secondary | ICD-10-CM | POA: Diagnosis not present

## 2017-05-28 DIAGNOSIS — H40831 Aqueous misdirection, right eye: Secondary | ICD-10-CM | POA: Diagnosis not present

## 2017-05-28 DIAGNOSIS — H4311 Vitreous hemorrhage, right eye: Secondary | ICD-10-CM | POA: Diagnosis not present

## 2017-05-28 DIAGNOSIS — H4053X4 Glaucoma secondary to other eye disorders, bilateral, indeterminate stage: Secondary | ICD-10-CM | POA: Diagnosis not present

## 2017-05-28 DIAGNOSIS — H31411 Hemorrhagic choroidal detachment, right eye: Secondary | ICD-10-CM | POA: Diagnosis not present

## 2017-05-29 DIAGNOSIS — D631 Anemia in chronic kidney disease: Secondary | ICD-10-CM | POA: Diagnosis not present

## 2017-05-29 DIAGNOSIS — E1129 Type 2 diabetes mellitus with other diabetic kidney complication: Secondary | ICD-10-CM | POA: Diagnosis not present

## 2017-05-29 DIAGNOSIS — D509 Iron deficiency anemia, unspecified: Secondary | ICD-10-CM | POA: Diagnosis not present

## 2017-05-29 DIAGNOSIS — N2581 Secondary hyperparathyroidism of renal origin: Secondary | ICD-10-CM | POA: Diagnosis not present

## 2017-05-29 DIAGNOSIS — N186 End stage renal disease: Secondary | ICD-10-CM | POA: Diagnosis not present

## 2017-06-01 DIAGNOSIS — N186 End stage renal disease: Secondary | ICD-10-CM | POA: Diagnosis not present

## 2017-06-01 DIAGNOSIS — N2581 Secondary hyperparathyroidism of renal origin: Secondary | ICD-10-CM | POA: Diagnosis not present

## 2017-06-01 DIAGNOSIS — E1129 Type 2 diabetes mellitus with other diabetic kidney complication: Secondary | ICD-10-CM | POA: Diagnosis not present

## 2017-06-01 DIAGNOSIS — D631 Anemia in chronic kidney disease: Secondary | ICD-10-CM | POA: Diagnosis not present

## 2017-06-01 DIAGNOSIS — D509 Iron deficiency anemia, unspecified: Secondary | ICD-10-CM | POA: Diagnosis not present

## 2017-06-02 DIAGNOSIS — E8770 Fluid overload, unspecified: Secondary | ICD-10-CM | POA: Diagnosis not present

## 2017-06-02 DIAGNOSIS — N2581 Secondary hyperparathyroidism of renal origin: Secondary | ICD-10-CM | POA: Diagnosis not present

## 2017-06-02 DIAGNOSIS — N186 End stage renal disease: Secondary | ICD-10-CM | POA: Diagnosis not present

## 2017-06-03 ENCOUNTER — Other Ambulatory Visit: Payer: Self-pay

## 2017-06-03 ENCOUNTER — Encounter (HOSPITAL_COMMUNITY): Payer: Self-pay | Admitting: *Deleted

## 2017-06-03 ENCOUNTER — Emergency Department (HOSPITAL_COMMUNITY): Payer: Medicare Other

## 2017-06-03 DIAGNOSIS — J810 Acute pulmonary edema: Secondary | ICD-10-CM | POA: Diagnosis not present

## 2017-06-03 DIAGNOSIS — E8779 Other fluid overload: Principal | ICD-10-CM | POA: Diagnosis present

## 2017-06-03 DIAGNOSIS — Z79899 Other long term (current) drug therapy: Secondary | ICD-10-CM

## 2017-06-03 DIAGNOSIS — E1122 Type 2 diabetes mellitus with diabetic chronic kidney disease: Secondary | ICD-10-CM | POA: Diagnosis present

## 2017-06-03 DIAGNOSIS — K219 Gastro-esophageal reflux disease without esophagitis: Secondary | ICD-10-CM | POA: Diagnosis present

## 2017-06-03 DIAGNOSIS — F39 Unspecified mood [affective] disorder: Secondary | ICD-10-CM | POA: Diagnosis not present

## 2017-06-03 DIAGNOSIS — D631 Anemia in chronic kidney disease: Secondary | ICD-10-CM | POA: Diagnosis not present

## 2017-06-03 DIAGNOSIS — E1129 Type 2 diabetes mellitus with other diabetic kidney complication: Secondary | ICD-10-CM | POA: Diagnosis not present

## 2017-06-03 DIAGNOSIS — Z9689 Presence of other specified functional implants: Secondary | ICD-10-CM | POA: Diagnosis present

## 2017-06-03 DIAGNOSIS — R0602 Shortness of breath: Secondary | ICD-10-CM | POA: Diagnosis not present

## 2017-06-03 DIAGNOSIS — R9431 Abnormal electrocardiogram [ECG] [EKG]: Secondary | ICD-10-CM | POA: Diagnosis not present

## 2017-06-03 DIAGNOSIS — Z992 Dependence on renal dialysis: Secondary | ICD-10-CM

## 2017-06-03 DIAGNOSIS — F419 Anxiety disorder, unspecified: Secondary | ICD-10-CM | POA: Diagnosis present

## 2017-06-03 DIAGNOSIS — Z8249 Family history of ischemic heart disease and other diseases of the circulatory system: Secondary | ICD-10-CM

## 2017-06-03 DIAGNOSIS — D638 Anemia in other chronic diseases classified elsewhere: Secondary | ICD-10-CM | POA: Diagnosis present

## 2017-06-03 DIAGNOSIS — J9600 Acute respiratory failure, unspecified whether with hypoxia or hypercapnia: Secondary | ICD-10-CM | POA: Diagnosis not present

## 2017-06-03 DIAGNOSIS — Z841 Family history of disorders of kidney and ureter: Secondary | ICD-10-CM

## 2017-06-03 DIAGNOSIS — Z91040 Latex allergy status: Secondary | ICD-10-CM

## 2017-06-03 DIAGNOSIS — I132 Hypertensive heart and chronic kidney disease with heart failure and with stage 5 chronic kidney disease, or end stage renal disease: Secondary | ICD-10-CM | POA: Diagnosis present

## 2017-06-03 DIAGNOSIS — N186 End stage renal disease: Secondary | ICD-10-CM | POA: Diagnosis present

## 2017-06-03 DIAGNOSIS — Z6831 Body mass index (BMI) 31.0-31.9, adult: Secondary | ICD-10-CM | POA: Diagnosis not present

## 2017-06-03 DIAGNOSIS — Z885 Allergy status to narcotic agent status: Secondary | ICD-10-CM

## 2017-06-03 DIAGNOSIS — H409 Unspecified glaucoma: Secondary | ICD-10-CM | POA: Diagnosis present

## 2017-06-03 DIAGNOSIS — Z7982 Long term (current) use of aspirin: Secondary | ICD-10-CM

## 2017-06-03 DIAGNOSIS — R0609 Other forms of dyspnea: Secondary | ICD-10-CM | POA: Diagnosis not present

## 2017-06-03 DIAGNOSIS — H548 Legal blindness, as defined in USA: Secondary | ICD-10-CM | POA: Diagnosis present

## 2017-06-03 DIAGNOSIS — Z7951 Long term (current) use of inhaled steroids: Secondary | ICD-10-CM

## 2017-06-03 DIAGNOSIS — I509 Heart failure, unspecified: Secondary | ICD-10-CM | POA: Diagnosis not present

## 2017-06-03 DIAGNOSIS — F329 Major depressive disorder, single episode, unspecified: Secondary | ICD-10-CM | POA: Diagnosis not present

## 2017-06-03 DIAGNOSIS — E11649 Type 2 diabetes mellitus with hypoglycemia without coma: Secondary | ICD-10-CM | POA: Diagnosis not present

## 2017-06-03 DIAGNOSIS — D509 Iron deficiency anemia, unspecified: Secondary | ICD-10-CM | POA: Diagnosis not present

## 2017-06-03 DIAGNOSIS — R252 Cramp and spasm: Secondary | ICD-10-CM | POA: Diagnosis present

## 2017-06-03 DIAGNOSIS — Z9001 Acquired absence of eye: Secondary | ICD-10-CM

## 2017-06-03 DIAGNOSIS — Z833 Family history of diabetes mellitus: Secondary | ICD-10-CM

## 2017-06-03 DIAGNOSIS — Z853 Personal history of malignant neoplasm of breast: Secondary | ICD-10-CM

## 2017-06-03 DIAGNOSIS — Z794 Long term (current) use of insulin: Secondary | ICD-10-CM

## 2017-06-03 DIAGNOSIS — Z90710 Acquired absence of both cervix and uterus: Secondary | ICD-10-CM

## 2017-06-03 DIAGNOSIS — E1140 Type 2 diabetes mellitus with diabetic neuropathy, unspecified: Secondary | ICD-10-CM | POA: Diagnosis present

## 2017-06-03 DIAGNOSIS — I5043 Acute on chronic combined systolic (congestive) and diastolic (congestive) heart failure: Secondary | ICD-10-CM | POA: Diagnosis not present

## 2017-06-03 DIAGNOSIS — J9621 Acute and chronic respiratory failure with hypoxia: Secondary | ICD-10-CM | POA: Diagnosis present

## 2017-06-03 DIAGNOSIS — R269 Unspecified abnormalities of gait and mobility: Secondary | ICD-10-CM | POA: Diagnosis present

## 2017-06-03 DIAGNOSIS — H540X55 Blindness right eye category 5, blindness left eye category 5: Secondary | ICD-10-CM | POA: Diagnosis not present

## 2017-06-03 DIAGNOSIS — R0901 Asphyxia: Secondary | ICD-10-CM | POA: Diagnosis not present

## 2017-06-03 DIAGNOSIS — N2581 Secondary hyperparathyroidism of renal origin: Secondary | ICD-10-CM | POA: Diagnosis not present

## 2017-06-03 DIAGNOSIS — H401112 Primary open-angle glaucoma, right eye, moderate stage: Secondary | ICD-10-CM | POA: Diagnosis not present

## 2017-06-03 LAB — CBC
HCT: 29.8 % — ABNORMAL LOW (ref 36.0–46.0)
HEMOGLOBIN: 10.2 g/dL — AB (ref 12.0–15.0)
MCH: 31.1 pg (ref 26.0–34.0)
MCHC: 34.2 g/dL (ref 30.0–36.0)
MCV: 90.9 fL (ref 78.0–100.0)
PLATELETS: 228 10*3/uL (ref 150–400)
RBC: 3.28 MIL/uL — AB (ref 3.87–5.11)
RDW: 17.8 % — ABNORMAL HIGH (ref 11.5–15.5)
WBC: 6.8 10*3/uL (ref 4.0–10.5)

## 2017-06-03 LAB — CBG MONITORING, ED: GLUCOSE-CAPILLARY: 174 mg/dL — AB (ref 65–99)

## 2017-06-03 LAB — BASIC METABOLIC PANEL
Anion gap: 15 (ref 5–15)
BUN: 18 mg/dL (ref 6–20)
CALCIUM: 9.1 mg/dL (ref 8.9–10.3)
CHLORIDE: 95 mmol/L — AB (ref 101–111)
CO2: 24 mmol/L (ref 22–32)
CREATININE: 6.19 mg/dL — AB (ref 0.44–1.00)
GFR calc non Af Amer: 6 mL/min — ABNORMAL LOW (ref >60)
GFR, EST AFRICAN AMERICAN: 7 mL/min — AB (ref >60)
Glucose, Bld: 158 mg/dL — ABNORMAL HIGH (ref 65–99)
Potassium: 3.8 mmol/L (ref 3.5–5.1)
SODIUM: 134 mmol/L — AB (ref 135–145)

## 2017-06-03 NOTE — ED Triage Notes (Signed)
Pt daughter to nurse first inquiring about wait, states her mother is diabetic and has not eaten. Pt vitals assessed, taken to triage for cbg check. Oxygen tank replaced

## 2017-06-03 NOTE — ED Notes (Signed)
Patient transported to X-ray 

## 2017-06-03 NOTE — ED Triage Notes (Signed)
Pt says she feels short of breath, last went to dialysis today and treatment was not complete because of her cramping. She says she does not feel like they drew off enough fluid.

## 2017-06-04 ENCOUNTER — Inpatient Hospital Stay (HOSPITAL_COMMUNITY)
Admission: EM | Admit: 2017-06-04 | Discharge: 2017-06-06 | DRG: 640 | Disposition: A | Discharge status: Home / Self Care | Payer: Medicare Other | Attending: Internal Medicine | Admitting: Internal Medicine

## 2017-06-04 ENCOUNTER — Other Ambulatory Visit: Payer: Self-pay

## 2017-06-04 ENCOUNTER — Encounter (HOSPITAL_COMMUNITY): Payer: Self-pay | Admitting: General Practice

## 2017-06-04 DIAGNOSIS — J810 Acute pulmonary edema: Secondary | ICD-10-CM | POA: Diagnosis not present

## 2017-06-04 DIAGNOSIS — H543 Unqualified visual loss, both eyes: Secondary | ICD-10-CM | POA: Diagnosis not present

## 2017-06-04 DIAGNOSIS — R269 Unspecified abnormalities of gait and mobility: Secondary | ICD-10-CM | POA: Diagnosis present

## 2017-06-04 DIAGNOSIS — Z885 Allergy status to narcotic agent status: Secondary | ICD-10-CM | POA: Diagnosis not present

## 2017-06-04 DIAGNOSIS — E1129 Type 2 diabetes mellitus with other diabetic kidney complication: Secondary | ICD-10-CM | POA: Diagnosis present

## 2017-06-04 DIAGNOSIS — I10 Essential (primary) hypertension: Secondary | ICD-10-CM | POA: Diagnosis not present

## 2017-06-04 DIAGNOSIS — I132 Hypertensive heart and chronic kidney disease with heart failure and with stage 5 chronic kidney disease, or end stage renal disease: Secondary | ICD-10-CM | POA: Diagnosis present

## 2017-06-04 DIAGNOSIS — Z7982 Long term (current) use of aspirin: Secondary | ICD-10-CM | POA: Diagnosis not present

## 2017-06-04 DIAGNOSIS — I5043 Acute on chronic combined systolic (congestive) and diastolic (congestive) heart failure: Secondary | ICD-10-CM | POA: Diagnosis present

## 2017-06-04 DIAGNOSIS — H548 Legal blindness, as defined in USA: Secondary | ICD-10-CM | POA: Diagnosis present

## 2017-06-04 DIAGNOSIS — D649 Anemia, unspecified: Secondary | ICD-10-CM | POA: Diagnosis present

## 2017-06-04 DIAGNOSIS — J811 Chronic pulmonary edema: Secondary | ICD-10-CM | POA: Diagnosis present

## 2017-06-04 DIAGNOSIS — E11649 Type 2 diabetes mellitus with hypoglycemia without coma: Secondary | ICD-10-CM | POA: Diagnosis not present

## 2017-06-04 DIAGNOSIS — Z9001 Acquired absence of eye: Secondary | ICD-10-CM | POA: Diagnosis not present

## 2017-06-04 DIAGNOSIS — R0600 Dyspnea, unspecified: Secondary | ICD-10-CM | POA: Diagnosis not present

## 2017-06-04 DIAGNOSIS — Z992 Dependence on renal dialysis: Secondary | ICD-10-CM | POA: Diagnosis not present

## 2017-06-04 DIAGNOSIS — Z794 Long term (current) use of insulin: Secondary | ICD-10-CM | POA: Diagnosis not present

## 2017-06-04 DIAGNOSIS — D631 Anemia in chronic kidney disease: Secondary | ICD-10-CM | POA: Diagnosis not present

## 2017-06-04 DIAGNOSIS — H409 Unspecified glaucoma: Secondary | ICD-10-CM | POA: Diagnosis present

## 2017-06-04 DIAGNOSIS — Z7951 Long term (current) use of inhaled steroids: Secondary | ICD-10-CM | POA: Diagnosis not present

## 2017-06-04 DIAGNOSIS — J9601 Acute respiratory failure with hypoxia: Secondary | ICD-10-CM | POA: Diagnosis not present

## 2017-06-04 DIAGNOSIS — E8770 Fluid overload, unspecified: Secondary | ICD-10-CM | POA: Diagnosis not present

## 2017-06-04 DIAGNOSIS — N186 End stage renal disease: Secondary | ICD-10-CM | POA: Diagnosis not present

## 2017-06-04 DIAGNOSIS — H4010X2 Unspecified open-angle glaucoma, moderate stage: Secondary | ICD-10-CM

## 2017-06-04 DIAGNOSIS — J9621 Acute and chronic respiratory failure with hypoxia: Secondary | ICD-10-CM | POA: Diagnosis present

## 2017-06-04 DIAGNOSIS — Z91040 Latex allergy status: Secondary | ICD-10-CM | POA: Diagnosis not present

## 2017-06-04 DIAGNOSIS — E1140 Type 2 diabetes mellitus with diabetic neuropathy, unspecified: Secondary | ICD-10-CM | POA: Diagnosis present

## 2017-06-04 DIAGNOSIS — Z853 Personal history of malignant neoplasm of breast: Secondary | ICD-10-CM | POA: Diagnosis not present

## 2017-06-04 DIAGNOSIS — E1122 Type 2 diabetes mellitus with diabetic chronic kidney disease: Secondary | ICD-10-CM | POA: Diagnosis not present

## 2017-06-04 DIAGNOSIS — K219 Gastro-esophageal reflux disease without esophagitis: Secondary | ICD-10-CM | POA: Diagnosis not present

## 2017-06-04 DIAGNOSIS — F419 Anxiety disorder, unspecified: Secondary | ICD-10-CM | POA: Diagnosis present

## 2017-06-04 DIAGNOSIS — D638 Anemia in other chronic diseases classified elsewhere: Secondary | ICD-10-CM | POA: Diagnosis present

## 2017-06-04 DIAGNOSIS — R252 Cramp and spasm: Secondary | ICD-10-CM | POA: Diagnosis present

## 2017-06-04 DIAGNOSIS — I5040 Unspecified combined systolic (congestive) and diastolic (congestive) heart failure: Secondary | ICD-10-CM | POA: Diagnosis not present

## 2017-06-04 DIAGNOSIS — Z79899 Other long term (current) drug therapy: Secondary | ICD-10-CM | POA: Diagnosis not present

## 2017-06-04 DIAGNOSIS — E8779 Other fluid overload: Secondary | ICD-10-CM | POA: Diagnosis present

## 2017-06-04 LAB — CBC
HCT: 30.2 % — ABNORMAL LOW (ref 36.0–46.0)
Hemoglobin: 10.1 g/dL — ABNORMAL LOW (ref 12.0–15.0)
MCH: 30.5 pg (ref 26.0–34.0)
MCHC: 33.4 g/dL (ref 30.0–36.0)
MCV: 91.2 fL (ref 78.0–100.0)
PLATELETS: 251 10*3/uL (ref 150–400)
RBC: 3.31 MIL/uL — AB (ref 3.87–5.11)
RDW: 17.9 % — AB (ref 11.5–15.5)
WBC: 7.1 10*3/uL (ref 4.0–10.5)

## 2017-06-04 LAB — GLUCOSE, CAPILLARY
GLUCOSE-CAPILLARY: 181 mg/dL — AB (ref 65–99)
GLUCOSE-CAPILLARY: 261 mg/dL — AB (ref 65–99)
Glucose-Capillary: 108 mg/dL — ABNORMAL HIGH (ref 65–99)

## 2017-06-04 LAB — BASIC METABOLIC PANEL
Anion gap: 14 (ref 5–15)
BUN: 21 mg/dL — AB (ref 6–20)
CALCIUM: 9.1 mg/dL (ref 8.9–10.3)
CO2: 25 mmol/L (ref 22–32)
CREATININE: 7.02 mg/dL — AB (ref 0.44–1.00)
Chloride: 94 mmol/L — ABNORMAL LOW (ref 101–111)
GFR, EST AFRICAN AMERICAN: 6 mL/min — AB (ref >60)
GFR, EST NON AFRICAN AMERICAN: 6 mL/min — AB (ref >60)
Glucose, Bld: 166 mg/dL — ABNORMAL HIGH (ref 65–99)
Potassium: 3.9 mmol/L (ref 3.5–5.1)
SODIUM: 133 mmol/L — AB (ref 135–145)

## 2017-06-04 LAB — CBG MONITORING, ED
GLUCOSE-CAPILLARY: 157 mg/dL — AB (ref 65–99)
GLUCOSE-CAPILLARY: 144 mg/dL — AB (ref 65–99)

## 2017-06-04 MED ORDER — HYDRALAZINE HCL 20 MG/ML IJ SOLN: 5.0000 mg | INTRAMUSCULAR | Status: DC | PRN

## 2017-06-04 MED ORDER — ORAL CARE MOUTH RINSE
15.0000 mL | Freq: Two times a day (BID) | OROMUCOSAL | Status: DC
  Administered 2017-06-05 – 2017-06-06 (×2): 15 mL via OROMUCOSAL

## 2017-06-04 MED ORDER — LISINOPRIL 40 MG PO TABS
40.0000 mg | ORAL_TABLET | Freq: Every day | ORAL | Status: DC
  Administered 2017-06-04 – 2017-06-05 (×2): 40 mg via ORAL
  Filled 2017-06-04 (×2): qty 1

## 2017-06-04 MED ORDER — SODIUM CHLORIDE 0.9 % IV SOLN: 100.0000 mL | INTRAVENOUS | Status: DC | PRN

## 2017-06-04 MED ORDER — LIDOCAINE-PRILOCAINE 2.5-2.5 % EX CREA
1.0000 "application " | TOPICAL_CREAM | CUTANEOUS | Status: DC | PRN
  Filled 2017-06-04: qty 5

## 2017-06-04 MED ORDER — PREDNISOLONE ACETATE 1 % OP SUSP
1.0000 [drp] | Freq: Three times a day (TID) | OPHTHALMIC | Status: DC
  Administered 2017-06-04 – 2017-06-06 (×8): 1 [drp] via OPHTHALMIC
  Filled 2017-06-04: qty 1

## 2017-06-04 MED ORDER — MORPHINE SULFATE (PF) 4 MG/ML IV SOLN
2.0000 mg | INTRAVENOUS | Status: DC | PRN
  Administered 2017-06-04 – 2017-06-06 (×3): 2 mg via INTRAVENOUS
  Filled 2017-06-04 (×4): qty 1

## 2017-06-04 MED ORDER — ALBUTEROL SULFATE (2.5 MG/3ML) 0.083% IN NEBU: 2.5000 mg | INHALATION_SOLUTION | RESPIRATORY_TRACT | Status: DC | PRN

## 2017-06-04 MED ORDER — DOXERCALCIFEROL 4 MCG/2ML IV SOLN
1.0000 ug | INTRAVENOUS | Status: DC
  Administered 2017-06-05: 1 ug via INTRAVENOUS
  Filled 2017-06-04: qty 2

## 2017-06-04 MED ORDER — PANTOPRAZOLE SODIUM 20 MG PO TBEC
20.0000 mg | DELAYED_RELEASE_TABLET | Freq: Every day | ORAL | Status: DC
  Administered 2017-06-04 – 2017-06-06 (×3): 20 mg via ORAL
  Filled 2017-06-04 (×4): qty 1

## 2017-06-04 MED ORDER — DIPHENHYDRAMINE HCL 50 MG/ML IJ SOLN
25.0000 mg | Freq: Once | INTRAMUSCULAR | Status: AC
  Administered 2017-06-04: 25 mg via INTRAVENOUS
  Filled 2017-06-04: qty 1

## 2017-06-04 MED ORDER — ACETAMINOPHEN 325 MG PO TABS: 650.0000 mg | ORAL_TABLET | Freq: Four times a day (QID) | ORAL | Status: DC | PRN

## 2017-06-04 MED ORDER — AMLODIPINE BESYLATE 10 MG PO TABS
10.0000 mg | ORAL_TABLET | Freq: Every day | ORAL | Status: DC
  Administered 2017-06-04 – 2017-06-05 (×2): 10 mg via ORAL
  Filled 2017-06-04 (×2): qty 1

## 2017-06-04 MED ORDER — KETOROLAC TROMETHAMINE 15 MG/ML IJ SOLN
15.0000 mg | Freq: Once | INTRAMUSCULAR | Status: AC
  Administered 2017-06-04: 15 mg via INTRAVENOUS
  Filled 2017-06-04: qty 1

## 2017-06-04 MED ORDER — MORPHINE SULFATE (PF) 4 MG/ML IV SOLN
4.0000 mg | Freq: Once | INTRAVENOUS | Status: AC
  Administered 2017-06-04: 4 mg via INTRAVENOUS
  Filled 2017-06-04: qty 1

## 2017-06-04 MED ORDER — CLONIDINE HCL 0.1 MG PO TABS
0.1000 mg | ORAL_TABLET | Freq: Two times a day (BID) | ORAL | Status: DC
  Administered 2017-06-04 – 2017-06-06 (×4): 0.1 mg via ORAL
  Filled 2017-06-04 (×4): qty 1

## 2017-06-04 MED ORDER — LANTHANUM CARBONATE 500 MG PO CHEW
2000.0000 mg | CHEWABLE_TABLET | Freq: Three times a day (TID) | ORAL | Status: DC
  Administered 2017-06-04 – 2017-06-06 (×7): 2000 mg via ORAL
  Filled 2017-06-04 (×10): qty 4

## 2017-06-04 MED ORDER — ASPIRIN 81 MG PO CHEW
81.0000 mg | CHEWABLE_TABLET | Freq: Every day | ORAL | Status: DC
  Administered 2017-06-04 – 2017-06-06 (×3): 81 mg via ORAL
  Filled 2017-06-04 (×3): qty 1

## 2017-06-04 MED ORDER — RENA-VITE PO TABS
1.0000 | ORAL_TABLET | Freq: Every day | ORAL | Status: DC
  Administered 2017-06-04 – 2017-06-06 (×3): 1 via ORAL
  Filled 2017-06-04 (×4): qty 1

## 2017-06-04 MED ORDER — ONDANSETRON HCL 4 MG PO TABS: 4.0000 mg | ORAL_TABLET | Freq: Four times a day (QID) | ORAL | Status: DC | PRN

## 2017-06-04 MED ORDER — ZOLPIDEM TARTRATE 5 MG PO TABS: 5.0000 mg | ORAL_TABLET | Freq: Every evening | ORAL | Status: DC | PRN

## 2017-06-04 MED ORDER — HEPARIN SODIUM (PORCINE) 5000 UNIT/ML IJ SOLN
5000.0000 [IU] | Freq: Three times a day (TID) | INTRAMUSCULAR | Status: DC
  Administered 2017-06-04 – 2017-06-06 (×5): 5000 [IU] via SUBCUTANEOUS
  Filled 2017-06-04 (×5): qty 1

## 2017-06-04 MED ORDER — HEPARIN SODIUM (PORCINE) 1000 UNIT/ML DIALYSIS
1000.0000 [IU] | INTRAMUSCULAR | Status: DC | PRN
  Filled 2017-06-04: qty 1

## 2017-06-04 MED ORDER — INSULIN DETEMIR 100 UNIT/ML ~~LOC~~ SOLN
8.0000 [IU] | Freq: Every day | SUBCUTANEOUS | Status: DC
  Administered 2017-06-04 – 2017-06-06 (×3): 8 [IU] via SUBCUTANEOUS
  Filled 2017-06-04 (×4): qty 0.08

## 2017-06-04 MED ORDER — SENNA 8.6 MG PO TABS
8.6000 mg | ORAL_TABLET | Freq: Two times a day (BID) | ORAL | Status: DC
  Administered 2017-06-04 – 2017-06-06 (×4): 8.6 mg via ORAL
  Filled 2017-06-04 (×7): qty 1

## 2017-06-04 MED ORDER — DOCUSATE SODIUM 100 MG PO CAPS
100.0000 mg | ORAL_CAPSULE | Freq: Every day | ORAL | Status: DC
  Administered 2017-06-04 – 2017-06-06 (×3): 100 mg via ORAL
  Filled 2017-06-04 (×3): qty 1

## 2017-06-04 MED ORDER — ALTEPLASE 2 MG IJ SOLR: 2.0000 mg | Freq: Once | INTRAMUSCULAR | Status: DC | PRN

## 2017-06-04 MED ORDER — METOCLOPRAMIDE HCL 5 MG/ML IJ SOLN
5.0000 mg | Freq: Once | INTRAMUSCULAR | Status: AC
Start: 2017-06-04 — End: 2017-06-04
  Administered 2017-06-04: 5 mg via INTRAVENOUS
  Filled 2017-06-04: qty 2

## 2017-06-04 MED ORDER — INSULIN ASPART 100 UNIT/ML ~~LOC~~ SOLN
0.0000 [IU] | Freq: Three times a day (TID) | SUBCUTANEOUS | Status: DC
  Administered 2017-06-04: 2 [IU] via SUBCUTANEOUS
  Administered 2017-06-04: 1 [IU] via SUBCUTANEOUS
  Administered 2017-06-05: 3 [IU] via SUBCUTANEOUS
  Administered 2017-06-05: 1 [IU] via SUBCUTANEOUS
  Administered 2017-06-05: 2 [IU] via SUBCUTANEOUS
  Filled 2017-06-04: qty 1

## 2017-06-04 MED ORDER — CARVEDILOL 12.5 MG PO TABS
12.5000 mg | ORAL_TABLET | Freq: Two times a day (BID) | ORAL | Status: DC
  Administered 2017-06-04 – 2017-06-06 (×4): 12.5 mg via ORAL
  Filled 2017-06-04 (×4): qty 1

## 2017-06-04 MED ORDER — TRAMADOL HCL 50 MG PO TABS
50.0000 mg | ORAL_TABLET | Freq: Four times a day (QID) | ORAL | Status: DC | PRN
  Administered 2017-06-05: 50 mg via ORAL
  Filled 2017-06-04: qty 1

## 2017-06-04 MED ORDER — ONDANSETRON HCL 4 MG/2ML IJ SOLN
4.0000 mg | Freq: Once | INTRAMUSCULAR | Status: AC
  Administered 2017-06-04: 4 mg via INTRAVENOUS
  Filled 2017-06-04: qty 2

## 2017-06-04 MED ORDER — FLUTICASONE PROPIONATE 50 MCG/ACT NA SUSP: 1.0000 | Freq: Every day | NASAL | Status: DC | PRN

## 2017-06-04 MED ORDER — LIDOCAINE HCL (PF) 1 % IJ SOLN: 5.0000 mL | INTRAMUSCULAR | Status: DC | PRN

## 2017-06-04 MED ORDER — ONDANSETRON HCL 4 MG/2ML IJ SOLN
4.0000 mg | Freq: Four times a day (QID) | INTRAMUSCULAR | Status: DC | PRN
  Administered 2017-06-05: 4 mg via INTRAVENOUS
  Filled 2017-06-04: qty 2

## 2017-06-04 MED ORDER — BENZONATATE 100 MG PO CAPS: 100.0000 mg | ORAL_CAPSULE | Freq: Three times a day (TID) | ORAL | Status: DC | PRN

## 2017-06-04 MED ORDER — PENTAFLUOROPROP-TETRAFLUOROETH EX AERO: 1.0000 "application " | INHALATION_SPRAY | CUTANEOUS | Status: DC | PRN

## 2017-06-04 MED ORDER — HEPARIN SODIUM (PORCINE) 1000 UNIT/ML DIALYSIS
2500.0000 [IU] | Freq: Once | INTRAMUSCULAR | Status: DC
  Filled 2017-06-04: qty 3

## 2017-06-04 NOTE — ED Notes (Signed)
Renal provider in for eval

## 2017-06-04 NOTE — ED Notes (Signed)
CBG 144 

## 2017-06-04 NOTE — ED Notes (Signed)
Pt c/o a headache  Dialysis pt fistula rt upper arm. Last dialyzed Wednesday  Some sob

## 2017-06-04 NOTE — Progress Notes (Addendum)
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  She endorses recent moderate headache 7/10 like a band around her head since yesterday at dialysis. Reports dialysis yesterday with 2.5L removed. Headache is recurrent today. 5/10 and like a band.   Reports she was recently started on home O2 and not sure how many liter O2 she uses at home.  Denies chest pain or palpitations. cxr done ED revealed increased pulmonary vascularity. LE with 3+ pitting edema.    Assessment/Plan: Principal Problem:   Fluid overload Active Problems:   Anemia   Acute on chronic combined systolic and diastolic CHF (congestive heart failure) (HCC)   Type II diabetes mellitus with renal manifestations (HCC)   Acute on chronic respiratory failure with hypoxia (HCC)   Essential hypertension   GERD (gastroesophageal reflux disease)   ESRD on dialysis ([place])   Blindness of both eyes   Glaucoma   Pulmonary edema  Acute on chronic hypoxic respiratory failure 2/2 to fluid overload vs acute pulmonary edema vs acute on chronic combined systolic and diastolic CHF:  -3+ leg edema -pulmonary edema by chest x-ray.  -2D echo on 10/30/16 showed EF of 45-50 percent with grade 2 diastolic dysfunction.  -oxygen saturations 100% on 3L [place].  -Nasal cannula oxygen as needed to maintain O2 saturation 92% or greater -possible dialysis today to remove fluid  ESRD (end stage renal disease) on dialysis (MWF):  -nephrology consulted for hemodialysis -we appreciate recommendations  Anemia:  -stable -Hemoglobin 10.2 -CBC am  Essential hypertension: -BP stable -amlodipine, Coreg, clonidine,  lisinopril  Diabetes mellitus with renal complications with ESRD:  -Last A1c 8.0, poorly controled.  -Patient is taking NovoLog, Levemir at home -will decrease Levemir dose from  12-8 units daily -SSI -Check A1c  GERD: -Protonix  Blindness of both eyes: -fall precaution -CM consult for discharge planning  Chronic headache -prn tylenol -migraine cocktail given: IV toradol once, IV benadryl once and IV reglan once  Ambulatory dysfunction -PT to evaluate and treat -pt is blind  Code Status: Full  Family Communication: Spoke with family member in the room  Disposition Plan: Will stay another midnight for possible dialysis today and tomorrow due to acute pulmonary edema.   Consultants:  nephrology  Procedures:  None  Antimicrobials:  None  DVT prophylaxis:  Heparin sq 5000 u TID   Objective: Vitals:   06/03/17 2330 06/04/17 0315 06/04/17 0330 06/04/17 0415  BP: (!) 176/79 (!) 176/69 (!) 173/66 (!) 194/79  Pulse: 72 72 71 76  Resp: 18 20 19 19   Temp: 99.2 F (37.3 C)     TempSrc: Oral     SpO2: 100% (!) 87% 91% 100%  Weight:      Height:       No intake or output data in the 24 hours ending 06/04/17 0803 Filed Weights   06/03/17 1910  Weight: 87.5 kg (193 lb)    Exam:   General:  66 yo CF WD WN in NAD, bilateral blindness, right sclera dry and erythematous  Cardiovascular: RRR with no rubs gallops   Respiratory: Rhonchi diffusely  Abdomen: soft NT ND NBS x 4 quadrants   Musculoskeletal: Moves all limbs freely. 3+ pitting edema in LE  bilaterally.  Skin: No noted open lesions  Psychiatry: Mood is appropriate for condition and setting   Data Reviewed: CBC: Recent Labs  Lab 06/03/17 1913 06/04/17 0425  WBC 6.8 7.1  HGB 10.2* 10.1*  HCT 29.8* 30.2*  MCV 90.9 91.2  PLT 228 264   Basic Metabolic Panel: Recent Labs  Lab 06/03/17 1913 06/04/17 0425  NA 134* 133*  K 3.8 3.9  CL 95* 94*  CO2 24 25  GLUCOSE 158* 166*  BUN  18 21*  CREATININE 6.19* 7.02*  CALCIUM 9.1 9.1   GFR: Estimated Creatinine Clearance: 8.8 mL/min (A) (by C-G formula based on SCr of 7.02 mg/dL (H)). Liver Function Tests: No results for input(s): AST, ALT, ALKPHOS, BILITOT, PROT, ALBUMIN in the last 168 hours. No results for input(s): LIPASE, AMYLASE in the last 168 hours. No results for input(s): AMMONIA in the last 168 hours. Coagulation Profile: No results for input(s): INR, PROTIME in the last 168 hours. Cardiac Enzymes: No results for input(s): CKTOTAL, CKMB, CKMBINDEX, TROPONINI in the last 168 hours. BNP (last 3 results) No results for input(s): PROBNP in the last 8760 hours. HbA1C: No results for input(s): HGBA1C in the last 72 hours. CBG: Recent Labs  Lab 06/03/17 2330 06/04/17 0225  GLUCAP 174* 157*   Lipid Profile: No results for input(s): CHOL, HDL, LDLCALC, TRIG, CHOLHDL, LDLDIRECT in the last 72 hours. Thyroid Function Tests: No results for input(s): TSH, T4TOTAL, FREET4, T3FREE, THYROIDAB in the last 72 hours. Anemia Panel: No results for input(s): VITAMINB12, FOLATE, FERRITIN, TIBC, IRON, RETICCTPCT in the last 72 hours. Urine analysis:    Component Value Date/Time   COLORURINE YELLOW 10/27/2016 1943   APPEARANCEUR CLEAR 10/27/2016 1943   LABSPEC 1.012 10/27/2016 1943   LABSPEC 1.025 02/13/2011 1533   PHURINE 8.0 10/27/2016 1943   GLUCOSEU 150 (A) 10/27/2016 1943   HGBUR NEGATIVE 10/27/2016 1943   BILIRUBINUR NEGATIVE 10/27/2016 1943   BILIRUBINUR Negative 02/13/2011 1533   KETONESUR NEGATIVE 10/27/2016 1943   PROTEINUR 100 (A) 10/27/2016 1943   UROBILINOGEN 0.2 04/28/2011 1045   NITRITE NEGATIVE 10/27/2016 1943   LEUKOCYTESUR NEGATIVE 10/27/2016 1943   LEUKOCYTESUR Negative 02/13/2011 1533   Sepsis Labs: @LABRCNTIP (procalcitonin:4,lacticidven:4)  )No results found for this or any previous visit (from the past 240 hour(s)).    Studies: Dg Chest 2 View  Result Date: 06/03/2017 CLINICAL  DATA:  Shortness of breath EXAM: CHEST  2 VIEW COMPARISON:  10/27/2016 FINDINGS: Cardiomegaly with vascular congestion. Increased interstitial opacity consistent with pulmonary edema. No large effusion. Aortic atherosclerosis. No pneumothorax. Contrast or radiopaque material in the colon. IMPRESSION: Cardiomegaly with vascular congestion and mild to moderate interstitial pulmonary edema. Electronically Signed   By: Donavan Foil M.D.   On: 06/03/2017 19:53    Scheduled Meds: . amLODipine  10 mg Oral QHS  . aspirin  81 mg Oral Q lunch  . carvedilol  12.5 mg Oral BID WC  . cloNIDine  0.1 mg Oral BID  . docusate sodium  100 mg Oral Daily  . [START ON 06/05/2017] doxercalciferol  1 mcg Intravenous Q M,W,F-HD  . heparin  5,000 Units Subcutaneous Q8H  . insulin aspart  0-9 Units Subcutaneous TID WC  . insulin detemir  8 Units Subcutaneous Q lunch  . lanthanum  2,000 mg Oral TID WC  . lisinopril  40 mg Oral QHS  . multivitamin  1 tablet Oral Q lunch  . pantoprazole  20 mg Oral Daily  . prednisoLONE acetate  1 drop Right  Eye TID AC & HS  . senna  8.6 mg Oral BID    Continuous Infusions:   LOS: 0 days     Kayleen Memos, MD [facility] Pager [phone]  If 7PM-7AM, please contact night-coverage [email or web address] Password [email or web address] 06/04/2017, 8:03 AM

## 2017-06-04 NOTE — ED Provider Notes (Signed)
TIME SEEN: 2:48 AM  CHIEF COMPLAINT: Shortness of breath  HPI: Patient is a 66 year old female with history of end-stage renal disease on hemodialysis Monday, Wednesday and Friday, hypertension, diabetes who presents to the emergency department with shortness of breath.  States that she has not been able to receive a full dialysis treatment because of severe right-sided headaches.  She has a known history of glaucoma and states that her ophthalmologist has told her that there is nothing else that they can do for her except remove her eye.  She is scheduled for follow-up next week.  She is on tramadol and eyedrops at home.  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  It was at her primary care doctor's office today and was hypoxic and had to be placed on oxygen.  Was discharged with approximately a quarter of a tank of oxygen but states that when this ran out they came to the emergency department.  No fevers or cough.  No chest pain.  No head injury.  No numbness or focal weakness.  Her right eye is injected and tearing but no other discharge.  PCP - [facility]  Nephrologist - [place]  ROS: See HPI Constitutional: no fever  Eyes: no drainage  ENT: no runny nose   Cardiovascular:  no chest pain  Resp:  SOB  GI: no vomiting GU: no dysuria Integumentary: no rash  Allergy: no hives  Musculoskeletal: no leg swelling  Neurological: no slurred speech ROS otherwise negative  PAST MEDICAL HISTORY/PAST SURGICAL HISTORY:  Past Medical History:  Diagnosis Date  . Anemia   . Anxiety   . Arthritis    "joints" (06/15/2013)  . Blood transfusion without reported diagnosis   . Breast cancer ([place])    left  . CHF (congestive heart failure) ([place])   . ESRD (end stage renal disease) ([place])    "suppose to start dialysis today" (06/15/2013)  .  GERD (gastroesophageal reflux disease)   . Hypertension   . Myalgia 12/31/2011  . Neuropathy 12/31/2011  . Shortness of breath    "lately it's been all the time" (06/15/2013)  . Type II diabetes mellitus (HCC)     MEDICATIONS:  Prior to Admission medications   Medication Sig Start Date End Date Taking? Authorizing Provider  albuterol (PROVENTIL) (2.5 MG/3ML) 0.083% nebulizer solution Take 3 mLs (2.5 mg total) by nebulization every 6 (six) hours as needed for wheezing or shortness of breath. 10/30/16  Yes Theodis Blaze, MD  diazepam (VALIUM) 5 MG tablet Take 1 tab one hour prior to MRI, repeat as needed 01/02/17  Yes Pete Pelt, PA-C  acetaminophen (TYLENOL) 500 MG tablet Take 500-1,000 mg by mouth every 4 (four) hours as needed for mild pain, moderate pain, fever or headache.     [provider]  albuterol (PROAIR HFA) 108 (90 Base) MCG/ACT inhaler Inhale 2 puffs into the lungs every 6 (six) hours as needed for wheezing or shortness of breath.    [provider]  aspirin 81 MG chewable tablet Chew 81 mg by mouth daily with lunch.     [provider]  B Complex-C-Folic Acid (RENA-VITE RX) 1 MG TABS Take 1 tablet by mouth daily with lunch.  09/29/16   [provider]  benzonatate (TESSALON PERLES) 100 MG capsule Take 1 capsule (100 mg  total) by mouth 3 (three) times daily as needed for cough. Patient not taking: Reported on 05/05/2017 08/28/16   Mackuen, Courteney Lyn, MD  carvedilol (COREG) 12.5 MG tablet Take 12.5 mg by mouth 2 (two) times daily with a meal.    [provider]  cloNIDine (CATAPRES) 0.1 MG tablet Take 0.1 mg by mouth See admin instructions. Take 1 tablet (0.1 mg) twice daily  - with lunch and at bedtime    [provider]  cyclobenzaprine (FLEXERIL) 5 MG tablet Take 5 mg by mouth 3 (three) times daily as needed for muscle spasms.  08/18/16   [provider]  darbepoetin (ARANESP) 200 MCG/0.4ML SOLN injection Inject  0.4 mLs (200 mcg total) into the vein every Wednesday with hemodialysis. 06/22/13   Geradine Girt, DO  docusate sodium (COLACE) 100 MG capsule Take 100 mg by mouth daily.    [provider]  doxercalciferol (HECTOROL) 4 MCG/2ML injection Inject 0.5 mLs (1 mcg total) into the vein every Monday, Wednesday, and Friday with hemodialysis. 06/18/13   Geradine Girt, DO  fluticasone (FLONASE) 50 MCG/ACT nasal spray Place 1-2 sprays into both nostrils daily as needed for allergies or rhinitis.     [provider]  insulin aspart (NOVOLOG FLEXPEN) 100 UNIT/ML FlexPen Inject 10-16 Units into the skin See admin instructions. Sliding scale     [provider]  Insulin Detemir (LEVEMIR FLEXTOUCH) 100 UNIT/ML Pen Inject 12 Units into the skin daily with lunch.    [provider]  lanthanum (FOSRENOL) 1000 MG chewable tablet Chew 2,000 mg by mouth 3 (three) times daily with meals.     [provider]  lisinopril (PRINIVIL,ZESTRIL) 40 MG tablet Take 40 mg by mouth at bedtime.     [provider]  ofloxacin (OCUFLOX) 0.3 % ophthalmic solution Place 1 drop into the right eye 4 (four) times daily. 04/27/17   [provider]  omeprazole (PRILOSEC OTC) 20 MG tablet Take 20 mg by mouth 2 (two) times daily as needed (reflux).     [provider]  pantoprazole (PROTONIX) 20 MG tablet Take 1 tablet (20 mg total) by mouth daily. Patient not taking: Reported on 05/05/2017 02/08/15   [name], MD  Phenylephrine-DM-GG-APAP [facility] FAST-MAX COLD FLU) 5-10-200-325 MG/10ML LIQD Take 7.5 mLs by mouth 2 (two) times daily as needed (cough).     [provider]  prednisoLONE acetate (PRED FORTE) 1 % ophthalmic suspension Place 1 drop into the right eye 4 (four) times daily. 04/27/17   [provider]  sennosides (SENOKOT) 8.8 MG/5ML syrup Take 5 mLs by mouth 2 (two) times daily. Patient not taking: Reported on 05/05/2017 10/30/16   Theodis Blaze, MD  simethicone (GAS-X) 80 MG chewable tablet Chew 80 mg by mouth as needed for flatulence.    [provider]    ALLERGIES:  Allergies  Allergen Reactions  . Oxycodone Other (See Comments)    Hallucinations   . Latex Hives  . Vicodin [Hydrocodone-Acetaminophen] Other (See Comments)    hallucinations    SOCIAL HISTORY:  Social History   Tobacco Use  . Smoking status: Never Smoker  . Smokeless tobacco: Never Used  Substance Use Topics  . Alcohol use: No    FAMILY HISTORY: Family History  Problem Relation Age of Onset  . Diabetes Mother   . Hyperlipidemia Mother   . Hypertension Mother   . Hypertension Father   . Diabetes Sister   . Diabetes Brother   .  Hypertension Brother   . Heart attack Brother   . Kidney disease Brother   . Colon cancer Neg Hx   . Colon polyps Neg Hx   . Esophageal cancer Neg Hx   . Gallbladder disease Neg Hx     EXAM: BP (!) 176/79   Pulse 72   Temp 99.2 F (37.3 C) (Oral)   Resp 18   Ht 5\' 5"  (1.651 m)   Wt 87.5 kg (193 lb)   SpO2 100%   BMI 32.12 kg/m  CONSTITUTIONAL: Alert and oriented and responds appropriately to questions.  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, right eye extraocular movements intact, patient does have photophobia, no periorbital swelling or erythema or warmth ENT: normal nose; moist mucous membranes NECK: Supple, no meningismus, no nuchal rigidity, no LAD  CARD: RRR; S1 and S2 appreciated; no murmurs, no clicks, no rubs, no gallops RESP: Normal chest excursion without splinting or tachypnea; breath sounds clear and equal bilaterally; no wheezes, no rhonchi, patient has rales bilaterally, no current hypoxia at rest on room air, does appear dyspneic when talking to me ABD/GI: Normal bowel  sounds; non-distended; soft, non-tender, no rebound, no guarding, no peritoneal signs, no hepatosplenomegaly BACK:  The back appears normal and is non-tender to palpation, there is no CVA tenderness EXT: Normal ROM in all joints; non-tender to palpation; no edema; normal capillary refill; no cyanosis, no calf tenderness or swelling    SKIN: Normal color for age and race; warm; no rash NEURO: Moves all extremities equally PSYCH: The patient's mood and manner are appropriate. Grooming and personal hygiene are appropriate.  MEDICAL DECISION MAKING: Patient here with pulmonary edema.  She does appear dyspneic when talking to me.  She is not hypoxic on room air at rest but was hypoxic at her PCPs office today and was sent to the emergency department.  Here her chest x-ray shows moderate pulmonary edema.  Her labs however are unremarkable.  She is mildly hypertensive.  Potassium is normal.  EKG shows no ischemic abnormality.  I feel she needs urgent dialysis.  She is not sure if she would be able to get dialysis this morning at her dialysis clinic and would like to stay which I feel is not unreasonable.  We will give her morphine for her chronic eye pain.  I do not feel there is anything that I can do other than control her pain.  It sounds like her ophthalmologist does not feel that there is anything else they can do either except remove this eye and she states she has follow-up scheduled next week.  ED PROGRESS:   3:20 AM Discussed patient's case with hospitalist, Dr. Blaine Hamper.  I have recommended admission and patient (and family if present) agree with this plan. Admitting physician will place admission orders.   I reviewed all nursing notes, vitals, pertinent previous records, EKGs, lab and urine results, imaging (as available).     EKG Interpretation  Date/Time:  Wednesday June 03 2017 19:03:26 EST Ventricular Rate:  72 PR Interval:  138 QRS Duration: 94 QT Interval:  438 QTC  Calculation: 479 R Axis:   30 Text Interpretation:  Sinus rhythm with Premature supraventricular complexes Left ventricular hypertrophy with repolarization abnormality Abnormal ECG No significant change since last tracing Confirmed by Vara Mairena, Cyril Mourning [phone]) on 06/04/2017 2:35:33 AM  Plummer Matich, Delice Bison, DO 06/04/17 [phone]

## 2017-06-04 NOTE — Progress Notes (Signed)
Hemodialysis Orders: Cathy MWF 4 hrs 180 NRe 400/Auto 1.5  85.5 kg 2.0 K/ 2.25 Ca UFP 2 Step  -Heparin 2500 units IV TIW -Hectorol 3 mcg IV TIW -Mircera 75 mcg IV q 4 weeks (last dose 06/03/17 HGB 10 05/27/17)  [name] is a 66 Y/O AA female with ESRD on HD MWF at [facility]. PMH DM/HTN, legally blind D/T glaucoma, GERD, anxiety, breast cancer, AOCD, SHPT, combined systolic/diastolic HF, chronic leg cramps, chronic HA 2/2 glaucoma.   Patient has H/O early termination of hemodialysis. She has not ran one full treatment in last 5 treatments. She signed off HD early yesterday leaving only 0.5 above EDW then presented yesterday evening to [facility] ED with C/O SOB. CXR shows cardiomegaly with vascular congestion and mild to moderate interstitial pulmonary edema. Other labs unremarkable. She presents with 2+ bilateral LE pitting edema, bibasilar rales. She has been admitted as OBSERVATION PATIENT for volume overload, acute on chronic hypoxic respiratory failure. Believe symptoms associated with incomplete HD treatments. Will order HD today. Will consult formally if patient status upgraded to in-patient.    Juanell Fairly NP-C [facility] [phone] (Pager)  Pt seen, examined and agree w A/P as above.  Kelly Splinter MD [employer] pager [phone]   06/04/2017, 2:34 PM

## 2017-06-04 NOTE — Progress Notes (Signed)
CM following for DCP; awaiting for PT eval for disposition needs; Aneta Mins [phone]

## 2017-06-04 NOTE — Procedures (Signed)
   I was present at this dialysis session, have reviewed the session itself and made  appropriate changes Kelly Splinter MD [place] pager [phone]   06/04/2017, 3:51 PM

## 2017-06-04 NOTE — Evaluation (Signed)
Physical Therapy Evaluation Patient Details Name: [name] MRN: [id number] DOB: February 07, 1951 Today's Date: 06/04/2017   History of Present Illness  Pt is a 66 y.o. female admitted on 06/04/17 with c/o SOB; being worked up for Acute on chronic respiratory failure with hypoxia due to fluid overload, pulmonary edema and CHF exacerbation. Pertinent PMH includes ESRD on HD (MWF), DM, CHF, HTN, breast CA, bilateral blindness.     Clinical Impression  Patient evaluated by Physical Therapy with no further acute PT needs identified. Pt dealing with onset of complete blindness since October 2018. PTA, pt indep with household mobility, requiring assist from sister or daughter for certain ADLs; relies on SCAT or family for transport. Today, pt amb with RW, requiring minA and verbal cues for assist secondary to unfamiliar environment. Pt does not want any follow-up PT services and plans to d/c back home with assist from family as needed. All education has been completed and the patient has no further questions. Recommend a RW for use at d/c. PT is signing off. Thank you for this referral.    Follow Up Recommendations No PT follow up;Supervision for mobility/OOB    Equipment Recommendations  Rolling walker with 5" wheels    Recommendations for Other Services       Precautions / Restrictions Precautions Precautions: Fall Precaution Comments: Pt is blind (reports since October 2018) Restrictions Weight Bearing Restrictions: No      Mobility  Bed Mobility Overal bed mobility: Independent                Transfers Overall transfer level: Modified independent Equipment used: None;Rolling walker (2 wheeled)             General transfer comment: Indep to stand with no AD; mod indep with RW with cues for hand placement  Ambulation/Gait Ambulation/Gait assistance: Min assist Ambulation Distance (Feet): 120 Feet Assistive device: None;Rolling walker (2 wheeled)   Gait velocity:  Decreased Gait velocity interpretation: <1.8 ft/sec, indicative of risk for recurrent falls General Gait Details: Amb in room with no AD and HHA for guidance, increased BLE instability which pt reports is because she has not gotten sleep. Amb stability much improved with RW; required intermittent minA and verbal cues to guide pt secondary to blindness  Stairs            Wheelchair Mobility    Modified Rankin (Stroke Patients Only)       Balance Overall balance assessment: Needs assistance   Sitting balance-Leahy Scale: Good       Standing balance-Leahy Scale: Fair                               Pertinent Vitals/Pain Pain Assessment: No/denies pain    Home Living Family/patient expects to be discharged to:: Private residence Living Arrangements: Alone Available Help at Discharge: Family;Available PRN/intermittently Type of Home: House Home Access: Level entry     Home Layout: One level        Prior Function Level of Independence: Needs assistance   Gait / Transfers Assistance Needed: Indep with household amb. Transport to HD with SCAT and manual w/c. Sister or daughter stays with pt on days she has HD  ADL's / Homemaking Assistance Needed: Reliant on assist for bathing. Sister preps meals. Indep with toileting and dressing.        Hand Dominance        Extremity/Trunk Assessment   Upper Extremity Assessment Upper  Extremity Assessment: Overall WFL for tasks assessed    Lower Extremity Assessment Lower Extremity Assessment: Overall WFL for tasks assessed       Communication   Communication: No difficulties  Cognition Arousal/Alertness: Awake/alert Behavior During Therapy: Flat affect Overall Cognitive Status: Within Functional Limits for tasks assessed                                        General Comments      Exercises     Assessment/Plan    PT Assessment Patent does not need any further PT services  PT  Problem List         PT Treatment Interventions      PT Goals (Current goals can be found in the Care Plan section)  Acute Rehab PT Goals PT Goal Formulation: All assessment and education complete, DC therapy    Frequency     Barriers to discharge        Co-evaluation               AM-PAC PT "6 Clicks" Daily Activity  Outcome Measure Difficulty turning over in bed (including adjusting bedclothes, sheets and blankets)?: None Difficulty moving from lying on back to sitting on the side of the bed? : None Difficulty sitting down on and standing up from a chair with arms (e.g., wheelchair, bedside commode, etc,.)?: None Help needed moving to and from a bed to chair (including a wheelchair)?: A Little Help needed walking in hospital room?: A Little Help needed climbing 3-5 steps with a railing? : A Little 6 Click Score: 21    End of Session Equipment Utilized During Treatment: Gait belt Activity Tolerance: Patient tolerated treatment well Patient left: in bed;with call bell/phone within reach Nurse Communication: Mobility status PT Visit Diagnosis: Other abnormalities of gait and mobility (R26.89)    Time: 5397-6734 PT Time Calculation (min) (ACUTE ONLY): 18 min   Charges:   PT Evaluation $PT Eval Moderate Complexity: 1 Mod     PT G Codes:   PT G-Codes **NOT FOR INPATIENT CLASS** Functional Assessment Tool Used: AM-PAC 6 Clicks Basic Mobility Functional Limitation: Mobility: Walking and moving around Mobility: Walking and Moving Around Current Status (L9379): At least 20 percent but less than 40 percent impaired, limited or restricted Mobility: Walking and Moving Around Goal Status [phone]): At least 20 percent but less than 40 percent impaired, limited or restricted Mobility: Walking and Moving Around Discharge Status [phone]): At least 20 percent but less than 40 percent impaired, limited or restricted   Mabeline Caras, PT, DPT Acute Rehab Services  Pager:  [place] 06/04/2017, 2:06 PM

## 2017-06-04 NOTE — ED Notes (Signed)
Pt received breakfast tray 

## 2017-06-04 NOTE — H&P (Signed)
History and Physical    [name]:[id number] DOB: September 28, 1950 DOA: 06/04/2017  Referring MD/NP/PA:   PCP: Prince Solian, MD   Patient coming from:  The patient is coming from home.  At baseline, pt is partially dependent for most of ADL.  Chief Complaint: Shortness of breath  HPI: [name] is a 66 y.o. 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  Pt states that she went to dialysis on Wednsday, but treatment was not complete because of her headache and leg cramping. She says she does not feel like they drew off enough fluid. She developed SOB today, which has been progressively getting worse. She was seen by her PCP and was reportedly found to have oxygen desaturation in PCPs office. She has cough with clear mucus production per her sister. Patient states that she had mildly chest pain earlier, which has resolved completely. Patient does not have fever or chills. She states that she has chronic headache due to glaucoma. She is actually blinded in both eyes. 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 She states that her ophthalmologist told her that he needs to have right eye be removed since there is no effective treatment any more. Patient states that sh sometimese has nausea and vomiting  due to glaucoma and headache. No diarrhea. Denies symptoms of UTI. No unilateral weakness. She has bilateral leg edema.  ED Course: pt was found to have WBC 6.8, potassium 3.8, carbonate 24, creatinine 6.19, BUN 18, temperature 99.2, no tachycardia, no tachypnea, currently oxygen saturation 100% on room air. Chest x-ray showed pulmonary edema. Patient is placed on telemetry bed for observation.  Review of Systems:   General: no fevers, chills, has HA and fatigue HEENT: has blindness, No hearing changes or sore throat Respiratory: has  dyspnea, coughing, no wheezing CV: no chest pain, no palpitations GI: has nausea, vomiting, no abdominal pain, diarrhea, constipation GU: no dysuria, burning on urination, increased urinary frequency, hematuria  Ext: has leg edema Neuro: no unilateral weakness, numbness, or tingling, no vision change or hearing loss Skin: no rash, no skin tear. MSK: No muscle spasm, no deformity, no limitation of range of movement in spin Heme: No easy bruising.  Travel history: No recent long distant travel.  Allergy:  Allergies  Allergen Reactions  . Oxycodone Other (See Comments)    Hallucinations   . Latex Hives  . Vicodin [Hydrocodone-Acetaminophen] Other (See Comments)    hallucinations    Past Medical History:  Diagnosis Date  . Anemia   . Anxiety   . Arthritis    "joints" (06/15/2013)  . Blood transfusion without reported diagnosis   . Breast cancer ([place])    left  . CHF (congestive heart failure) ([place])   . ESRD (end stage renal disease) ([place])    "suppose to start dialysis today" (06/15/2013)  . GERD (gastroesophageal reflux disease)   . Hypertension   . Myalgia 12/31/2011  . Neuropathy 12/31/2011  . Shortness of breath    "lately it's been all the time" (06/15/2013)  . Type II diabetes mellitus ([place])     Past Surgical History:  Procedure Laterality Date  . ABDOMINAL HYSTERECTOMY     partial  . AV FISTULA PLACEMENT Right 06/06/2013   Procedure: ARTERIOVENOUS (AV) FISTULA CREATION-RIGHT BRACHIAL CEPHALIC;  Surgeon: Conrad Bradley Beach, MD;  Location: [facility];  Service: Vascular;  Laterality: Right;  . BREAST  BIOPSY Left   . BREAST LUMPECTOMY Left    "and took out some lymph nodes" (06/15/2013)  . CATARACT EXTRACTION W/ ANTERIOR VITRECTOMY Bilateral   . CESAREAN SECTION  1980  . EYE SURGERY Bilateral    laser surgery  . PARS PLANA VITRECTOMY Right 05/05/2017   Procedure: PARS PLANA VITRECTOMY WITH 25 GAUGE; PARTIAL REMOVAL OF OIL; INFERIOR PERIPHERAL IRIDECTOMY, REFORM ANTERIOR  CHAMBER RIGHT EYE;  Surgeon: [name], MD;  Location: [facility];  Service: Ophthalmology;  Laterality: Right;  . REFRACTIVE SURGERY Bilateral   . REMOVAL OF A DIALYSIS CATHETER Right 06/06/2013   Procedure: REMOVAL OF RIGHT MEDIPORT;  Surgeon: Conrad Spackenkill, MD;  Location: [facility];  Service: Vascular;  Laterality: Right;  . TONSILLECTOMY      Social History:  reports that  has never smoked. she has never used smokeless tobacco. She reports that she does not drink alcohol or use drugs.  Family History:  Family History  Problem Relation Age of Onset  . Diabetes Mother   . Hyperlipidemia Mother   . Hypertension Mother   . Hypertension Father   . Diabetes Sister   . Diabetes Brother   . Hypertension Brother   . Heart attack Brother   . Kidney disease Brother   . Colon cancer Neg Hx   . Colon polyps Neg Hx   . Esophageal cancer Neg Hx   . Gallbladder disease Neg Hx      Prior to Admission medications   Medication Sig Start Date End Date Taking? Authorizing Provider  acetaminophen (TYLENOL) 500 MG tablet Take 500-1,000 mg by mouth every 4 (four) hours as needed for mild pain, moderate pain, fever or headache.    Yes [provider]  albuterol (PROAIR HFA) 108 (90 Base) MCG/ACT inhaler Inhale 2 puffs into the lungs every 6 (six) hours as needed for wheezing or shortness of breath.   Yes [provider]  albuterol (PROVENTIL) (2.5 MG/3ML) 0.083% nebulizer solution Take 3 mLs (2.5 mg total) by nebulization every 6 (six) hours as needed for wheezing or shortness of breath. 10/30/16  Yes Theodis Blaze, MD  amLODipine (NORVASC) 10 MG tablet Take 10 mg at bedtime by mouth. 06/02/17  Yes [provider]  aspirin 81 MG chewable tablet Chew 81 mg by mouth daily with lunch.    Yes [provider]  B Complex-C-Folic Acid (RENA-VITE RX) 1 MG TABS Take 1 tablet by mouth daily with lunch.  09/29/16  Yes [provider]  carvedilol (COREG) 12.5 MG tablet Take  12.5 mg by mouth 2 (two) times daily with a meal.   Yes [provider]  cloNIDine (CATAPRES) 0.1 MG tablet Take 0.1 mg 2 (two) times daily by mouth. with lunch and at bedtime   Yes [provider]  docusate sodium (COLACE) 100 MG capsule Take 100 mg by mouth daily.   Yes [provider]  doxercalciferol (HECTOROL) 4 MCG/2ML injection Inject 0.5 mLs (1 mcg total) into the vein every Monday, Wednesday, and Friday with hemodialysis. 06/18/13  Yes Vann, Jessica U, DO  fluticasone (FLONASE) 50 MCG/ACT nasal spray Place 1-2 sprays into both nostrils daily as needed for allergies or rhinitis.    Yes [provider]  insulin aspart (NOVOLOG FLEXPEN) 100 UNIT/ML FlexPen Inject 10-16 Units into the skin See admin instructions. Sliding scale    Yes [provider]  Insulin Detemir (LEVEMIR FLEXTOUCH) 100 UNIT/ML Pen Inject 12 Units into the skin daily with lunch.  Yes [provider]  lanthanum (FOSRENOL) 1000 MG chewable tablet Chew 2,000 mg by mouth 3 (three) times daily with meals.    Yes [provider]  lisinopril (PRINIVIL,ZESTRIL) 40 MG tablet Take 40 mg by mouth at bedtime.    Yes [provider]  omeprazole (PRILOSEC OTC) 20 MG tablet Take 20 mg by mouth 2 (two) times daily as needed (reflux).    Yes [provider]  prednisoLONE acetate (PRED FORTE) 1 % ophthalmic suspension Place 1 drop into the right eye 4 (four) times daily. 04/27/17  Yes [provider]  traMADol (ULTRAM) 50 MG tablet Take 50 mg every 6 (six) hours as needed by mouth for moderate pain or severe pain.    Yes [provider]  benzonatate (TESSALON PERLES) 100 MG capsule Take 1 capsule (100 mg total) by mouth 3 (three) times daily as needed for cough. Patient not taking: Reported on 05/05/2017 08/28/16   Mackuen, Fredia Sorrow, MD  darbepoetin (ARANESP) 200 MCG/0.4ML SOLN injection Inject 0.4 mLs (200 mcg total) into the vein every  Wednesday with hemodialysis. 06/22/13   Geradine Girt, DO  diazepam (VALIUM) 5 MG tablet Take 1 tab one hour prior to MRI, repeat as needed Patient not taking: Reported on 06/04/2017 01/02/17   Pete Pelt, PA-C  pantoprazole (PROTONIX) 20 MG tablet Take 1 tablet (20 mg total) by mouth daily. Patient not taking: Reported on 05/05/2017 02/08/15   [name], MD  sennosides (SENOKOT) 8.8 MG/5ML syrup Take 5 mLs by mouth 2 (two) times daily. Patient not taking: Reported on 05/05/2017 10/30/16   Theodis Blaze, MD    Physical Exam: Vitals:   06/03/17 2330 06/04/17 0315 06/04/17 0330 06/04/17 0415  BP: (!) 176/79 (!) 176/69 (!) 173/66 (!) 194/79  Pulse: 72 72 71 76  Resp: 18 20 19 19   Temp: 99.2 F (37.3 C)     TempSrc: Oral     SpO2: 100% (!) 87% 91% 100%  Weight:      Height:       General: Not in acute distress HEENT:       Eyes: has blindness in both eyes       ENT: No discharge from the ears and nose, no pharynx injection, no tonsillar enlargement.        Neck: positive JVD, no bruit, no mass felt. Heme: No neck lymph node enlargement. Cardiac: S1/S2, RRR, No murmurs, No gallops or rubs. Respiratory: has scattered rales bilaterally. No wheezing, rhonchi or rubs. GI: Soft, nondistended, nontender, no rebound pain, no organomegaly, BS present. GU: No hematuria Ext: 3+ pitting leg edema bilaterally. 2+DP/PT pulse bilaterally. Musculoskeletal: No joint deformities, No joint redness or warmth, no limitation of ROM in spin. Skin: No rashes.  Neuro: Alert, oriented X3, cranial nerves IV-XII grossly intact, moves all extremities normally. Psych: Patient is not psychotic, no suicidal or hemocidal ideation.  Labs on Admission: I have personally reviewed following labs and imaging studies  CBC: Recent Labs  Lab 06/03/17 1913 06/04/17 0425  WBC 6.8 7.1  HGB 10.2* 10.1*  HCT 29.8* 30.2*  MCV 90.9 91.2  PLT 228 381   Basic Metabolic Panel: Recent Labs  Lab  06/03/17 1913 06/04/17 0425  NA 134* 133*  K 3.8 3.9  CL 95* 94*  CO2 24 25  GLUCOSE 158* 166*  BUN 18 21*  CREATININE 6.19* 7.02*  CALCIUM 9.1 9.1   GFR: Estimated Creatinine Clearance: 8.8 mL/min (A) (by C-G formula based  on SCr of 7.02 mg/dL (H)). Liver Function Tests: No results for input(s): AST, ALT, ALKPHOS, BILITOT, PROT, ALBUMIN in the last 168 hours. No results for input(s): LIPASE, AMYLASE in the last 168 hours. No results for input(s): AMMONIA in the last 168 hours. Coagulation Profile: No results for input(s): INR, PROTIME in the last 168 hours. Cardiac Enzymes: No results for input(s): CKTOTAL, CKMB, CKMBINDEX, TROPONINI in the last 168 hours. BNP (last 3 results) No results for input(s): PROBNP in the last 8760 hours. HbA1C: No results for input(s): HGBA1C in the last 72 hours. CBG: Recent Labs  Lab 06/03/17 2330 06/04/17 0225  GLUCAP 174* 157*   Lipid Profile: No results for input(s): CHOL, HDL, LDLCALC, TRIG, CHOLHDL, LDLDIRECT in the last 72 hours. Thyroid Function Tests: No results for input(s): TSH, T4TOTAL, FREET4, T3FREE, THYROIDAB in the last 72 hours. Anemia Panel: No results for input(s): VITAMINB12, FOLATE, FERRITIN, TIBC, IRON, RETICCTPCT in the last 72 hours. Urine analysis:    Component Value Date/Time   COLORURINE YELLOW 10/27/2016 1943   APPEARANCEUR CLEAR 10/27/2016 1943   LABSPEC 1.012 10/27/2016 1943   LABSPEC 1.025 02/13/2011 1533   PHURINE 8.0 10/27/2016 1943   GLUCOSEU 150 (A) 10/27/2016 1943   HGBUR NEGATIVE 10/27/2016 1943   BILIRUBINUR NEGATIVE 10/27/2016 1943   BILIRUBINUR Negative 02/13/2011 1533   KETONESUR NEGATIVE 10/27/2016 1943   PROTEINUR 100 (A) 10/27/2016 1943   UROBILINOGEN 0.2 04/28/2011 1045   NITRITE NEGATIVE 10/27/2016 1943   LEUKOCYTESUR NEGATIVE 10/27/2016 1943   LEUKOCYTESUR Negative 02/13/2011 1533   Sepsis Labs: @LABRCNTIP (procalcitonin:4,lacticidven:4) )No results found for this or any previous  visit (from the past 240 hour(s)).   Radiological Exams on Admission: Dg Chest 2 View  Result Date: 06/03/2017 CLINICAL DATA:  Shortness of breath EXAM: CHEST  2 VIEW COMPARISON:  10/27/2016 FINDINGS: Cardiomegaly with vascular congestion. Increased interstitial opacity consistent with pulmonary edema. No large effusion. Aortic atherosclerosis. No pneumothorax. Contrast or radiopaque material in the colon. IMPRESSION: Cardiomegaly with vascular congestion and mild to moderate interstitial pulmonary edema. Electronically Signed   By: Donavan Foil M.D.   On: 06/03/2017 19:53     EKG: Independently reviewed. Sinus rhythm, QTC 479, PVC, early R-wave progression, mild ST depression diffusely   Assessment/Plan Principal Problem:   Fluid overload Active Problems:   Anemia   Acute on chronic combined systolic and diastolic CHF (congestive heart failure) (HCC)   Type II diabetes mellitus with renal manifestations (H[facility])   Acute on chronic respiratory failure with hypoxia ([facility])   Essential hypertension   GERD (gastroesophageal reflux disease)   ESRD on dialysis ([facility])   Blindness of both eyes   Glaucoma   Pulmonary edema  Acute on chronic respiratory failure with hypoxia due to fluid overload, pulmonary edema and acute on chronic combined systolic and diastolic CHF: Patient has bilateral 2+ leg edema, positive JVD and pulmonary edema by chest x-ray. 2D echo on 10/30/16 showed EF of 45-50 percent with grade 2 diastolic dysfunction. Currently patient oxygen saturations 100%. This is likely due to incomplete course of HD yesterday.  -will place on tele bed for obs -will get HD in AM -prn albuterol nebs -Nasal cannula oxygen as needed  ESRD (end stage renal disease) on dialysis (MWF): potassium 3.8, carbonate 24, creatinine 6.19, BUN 18. 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 Patient is taking NovoLog, Levemir at home -will decrease Levemir dose from  12-8 units daily -SSI -Check A1c  GERD: -Protonix  Blindness of both eyes: -fall precaution -CM consult  HA due to Glaucoma: -prn tylenol, tramadol and prn morphine   DVT ppx: SQ Heparin    Code Status: Full code Family Communication:  Yes, patient's  Sister at bed side Disposition Plan:  Anticipate discharge back to previous home environment Consults called:  none Admission status: Obs / tele    Date of Service 06/04/2017    Ivor Costa [facility] Pager [phone]  If 7PM-7AM, please contact night-coverage [email or web address] Password [email or web address] 06/04/2017, 5:30 AM

## 2017-06-05 DIAGNOSIS — R0600 Dyspnea, unspecified: Secondary | ICD-10-CM

## 2017-06-05 DIAGNOSIS — J9621 Acute and chronic respiratory failure with hypoxia: Secondary | ICD-10-CM

## 2017-06-05 DIAGNOSIS — E8770 Fluid overload, unspecified: Secondary | ICD-10-CM

## 2017-06-05 DIAGNOSIS — H4010X2 Unspecified open-angle glaucoma, moderate stage: Secondary | ICD-10-CM

## 2017-06-05 DIAGNOSIS — N186 End stage renal disease: Secondary | ICD-10-CM

## 2017-06-05 DIAGNOSIS — H409 Unspecified glaucoma: Secondary | ICD-10-CM

## 2017-06-05 DIAGNOSIS — I5043 Acute on chronic combined systolic (congestive) and diastolic (congestive) heart failure: Secondary | ICD-10-CM

## 2017-06-05 DIAGNOSIS — D631 Anemia in chronic kidney disease: Secondary | ICD-10-CM

## 2017-06-05 DIAGNOSIS — H543 Unqualified visual loss, both eyes: Secondary | ICD-10-CM

## 2017-06-05 DIAGNOSIS — I10 Essential (primary) hypertension: Secondary | ICD-10-CM

## 2017-06-05 DIAGNOSIS — K219 Gastro-esophageal reflux disease without esophagitis: Secondary | ICD-10-CM

## 2017-06-05 DIAGNOSIS — J810 Acute pulmonary edema: Secondary | ICD-10-CM

## 2017-06-05 DIAGNOSIS — Z992 Dependence on renal dialysis: Secondary | ICD-10-CM

## 2017-06-05 LAB — CBC
HCT: 29.8 % — ABNORMAL LOW (ref 36.0–46.0)
HEMATOCRIT: 27.4 % — AB (ref 36.0–46.0)
HEMOGLOBIN: 9.4 g/dL — AB (ref 12.0–15.0)
HEMOGLOBIN: 8.6 g/dL — AB (ref 12.0–15.0)
MCH: 29.7 pg (ref 26.0–34.0)
MCH: 29.6 pg (ref 26.0–34.0)
MCHC: 31.5 g/dL (ref 30.0–36.0)
MCHC: 31.4 g/dL (ref 30.0–36.0)
MCV: 94 fL (ref 78.0–100.0)
MCV: 94.2 fL (ref 78.0–100.0)
PLATELETS: 224 10*3/uL (ref 150–400)
Platelets: 212 10*3/uL (ref 150–400)
RBC: 3.17 MIL/uL — AB (ref 3.87–5.11)
RBC: 2.91 MIL/uL — ABNORMAL LOW (ref 3.87–5.11)
RDW: 18.3 % — ABNORMAL HIGH (ref 11.5–15.5)
RDW: 18.2 % — ABNORMAL HIGH (ref 11.5–15.5)
WBC: 6.5 10*3/uL (ref 4.0–10.5)
WBC: 6.4 10*3/uL (ref 4.0–10.5)

## 2017-06-05 LAB — COMPREHENSIVE METABOLIC PANEL
ALBUMIN: 3.4 g/dL — AB (ref 3.5–5.0)
ALK PHOS: 73 U/L (ref 38–126)
ALT: 24 U/L (ref 14–54)
AST: 24 U/L (ref 15–41)
Anion gap: 9 (ref 5–15)
BUN: 12 mg/dL (ref 6–20)
CALCIUM: 8.7 mg/dL — AB (ref 8.9–10.3)
CHLORIDE: 97 mmol/L — AB (ref 101–111)
CO2: 30 mmol/L (ref 22–32)
CREATININE: 5.19 mg/dL — AB (ref 0.44–1.00)
GFR calc Af Amer: 9 mL/min — ABNORMAL LOW (ref >60)
GFR calc non Af Amer: 8 mL/min — ABNORMAL LOW (ref >60)
Glucose, Bld: 150 mg/dL — ABNORMAL HIGH (ref 65–99)
Potassium: 3.9 mmol/L (ref 3.5–5.1)
SODIUM: 136 mmol/L (ref 135–145)
Total Bilirubin: 0.7 mg/dL (ref 0.3–1.2)
Total Protein: 6.6 g/dL (ref 6.5–8.1)

## 2017-06-05 LAB — RENAL FUNCTION PANEL
ALBUMIN: 3.1 g/dL — AB (ref 3.5–5.0)
ANION GAP: 8 (ref 5–15)
BUN: 19 mg/dL (ref 6–20)
CO2: 28 mmol/L (ref 22–32)
Calcium: 8.6 mg/dL — ABNORMAL LOW (ref 8.9–10.3)
Chloride: 99 mmol/L — ABNORMAL LOW (ref 101–111)
Creatinine, Ser: 6.44 mg/dL — ABNORMAL HIGH (ref 0.44–1.00)
GFR calc Af Amer: 7 mL/min — ABNORMAL LOW (ref >60)
GFR calc non Af Amer: 6 mL/min — ABNORMAL LOW (ref >60)
GLUCOSE: 113 mg/dL — AB (ref 65–99)
PHOSPHORUS: 3.7 mg/dL (ref 2.5–4.6)
POTASSIUM: 4.2 mmol/L (ref 3.5–5.1)
Sodium: 135 mmol/L (ref 135–145)

## 2017-06-05 LAB — GLUCOSE, CAPILLARY
Glucose-Capillary: 134 mg/dL — ABNORMAL HIGH (ref 65–99)
Glucose-Capillary: 222 mg/dL — ABNORMAL HIGH (ref 65–99)
Glucose-Capillary: 151 mg/dL — ABNORMAL HIGH (ref 65–99)
Glucose-Capillary: 64 mg/dL — ABNORMAL LOW (ref 65–99)

## 2017-06-05 LAB — HEMOGLOBIN A1C
Hgb A1c MFr Bld: 7.3 % — ABNORMAL HIGH (ref 4.8–5.6)
Mean Plasma Glucose: 162.81 mg/dL

## 2017-06-05 LAB — BRAIN NATRIURETIC PEPTIDE: B Natriuretic Peptide: >4500 pg/mL — ABNORMAL HIGH (ref 0.0–100.0)

## 2017-06-05 MED ORDER — DOXERCALCIFEROL 4 MCG/2ML IV SOLN
INTRAVENOUS | Status: AC
  Administered 2017-06-05: 1 ug via INTRAVENOUS
  Filled 2017-06-05: qty 2

## 2017-06-05 MED ORDER — DIPHENHYDRAMINE HCL 50 MG/ML IJ SOLN
12.5000 mg | Freq: Once | INTRAMUSCULAR | Status: AC
  Administered 2017-06-05: 12.5 mg via INTRAVENOUS
  Filled 2017-06-05: qty 1

## 2017-06-05 MED ORDER — SODIUM CHLORIDE 0.9 % IV SOLN: 100.0000 mL | INTRAVENOUS | Status: DC | PRN

## 2017-06-05 MED ORDER — ALTEPLASE 2 MG IJ SOLR: 2.0000 mg | Freq: Once | INTRAMUSCULAR | Status: DC | PRN

## 2017-06-05 MED ORDER — KETOROLAC TROMETHAMINE 15 MG/ML IJ SOLN
15.0000 mg | Freq: Once | INTRAMUSCULAR | Status: AC
  Administered 2017-06-05: 15 mg via INTRAVENOUS
  Filled 2017-06-05: qty 1

## 2017-06-05 MED ORDER — LIDOCAINE HCL (PF) 1 % IJ SOLN: 5.0000 mL | INTRAMUSCULAR | Status: DC | PRN

## 2017-06-05 MED ORDER — LIDOCAINE-PRILOCAINE 2.5-2.5 % EX CREA: 1.0000 "application " | TOPICAL_CREAM | CUTANEOUS | Status: DC | PRN

## 2017-06-05 MED ORDER — PENTAFLUOROPROP-TETRAFLUOROETH EX AERO: 1.0000 "application " | INHALATION_SPRAY | CUTANEOUS | Status: DC | PRN

## 2017-06-05 MED ORDER — METOCLOPRAMIDE HCL 5 MG/ML IJ SOLN
5.0000 mg | Freq: Once | INTRAMUSCULAR | Status: AC
Start: 2017-06-05 — End: 2017-06-05
  Administered 2017-06-05: 5 mg via INTRAVENOUS
  Filled 2017-06-05: qty 2

## 2017-06-05 MED ORDER — HEPARIN SODIUM (PORCINE) 1000 UNIT/ML DIALYSIS: 1000.0000 [IU] | INTRAMUSCULAR | Status: DC | PRN

## 2017-06-05 MED ORDER — HEPARIN SODIUM (PORCINE) 1000 UNIT/ML DIALYSIS: 2000.0000 [IU] | Freq: Once | INTRAMUSCULAR | Status: DC

## 2017-06-05 NOTE — Progress Notes (Signed)
Dialysis treatment completed.  2500 mL ultrafiltrated and net fluid removal 2000 mL.    Patient status unchanged. Lung sounds diminished to ausculation in all fields. BLE 1+ edema. Cardiac: NSR.  Disconnected lines and removed needles.  Pressure held for 10 minutes and band aid/gauze dressing applied.  Report given to bedside RN, Edd Arbour.

## 2017-06-05 NOTE — Progress Notes (Addendum)
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 She endorses recent moderate headache 7/10 like a band around her head since yesterday at dialysis. Reports dialysis yesterday with 2.5L removed. Headache is recurrent today. 5/10 and like a band. Reports she was recently started on home O2 and not sure how many liter O2 she uses at home.  Patient had dialysis yesterday and this morning will have dialysis. Reports headache that is associated with right eye discomfort. She takes drops for it and has a follow up appointment with her ophthalmologist. States her right eye will eventually be removed.   Assessment/Plan: Principal Problem:   Fluid overload Active Problems:   Anemia   Acute on chronic combined systolic and diastolic CHF (congestive heart failure) (HCC)   Type II diabetes mellitus with renal manifestations (HCC)   Acute on chronic respiratory failure with hypoxia (HCC)   Essential hypertension   GERD (gastroesophageal reflux disease)   ESRD on dialysis ([place])   Blindness of both eyes   Glaucoma   Pulmonary edema  Acute on chronic hypoxic respiratory failure 2/2 to fluid overload vs acute pulmonary edema vs acute on chronic combined systolic and diastolic CHF: -improving -pulmonary edemabychest x-ray 06/04/17. -2D echo on4/12/18 showed EF of 45-50 percent with grade 2 diastolic dysfunction.  -oxygen saturations 100% on 3L [place].  -Nasal cannula oxygen as needed to maintain O2 saturation 92% or greater -dialysis yesterday possible dialysis today to remove fluid -may need more dialysis in the am.  ESRD (end stage renal disease) on dialysis  (MWF): -nephrology consulted for hemodialysis -we appreciate recommendations  Anemia of chronic disease: -Most likely 2/2 to ESRD -stable; no sign of bleeding -Hemoglobin 9.4 from 10.2 -could be dilutional but if continues to drop consider FOBT -CBC am  Essential hypertension: -BP stable -amlodipine, Coreg, clonidine, lisinopril  Diabetes mellitus with renal complicationswithESRD: -[id number] (06/05/17), poorlycontroled.  -Patient is taking[place]ovoLog, [place] home -will decreaseLevemirdose from 12-8 units daily -SSI  GERD: -Protonix  Blindness of both eyes: -fall precaution -CM consult for discharge planning  Chronic headache most likely 2/2 to severe right eye glaucoma -prn tylenol until she follows up with her ophthalmologist on Monday 06/08/17 -migraine cocktail given: IV toradol once, IV benadryl once and IV reglan before and after dialysis  Ambulatory dysfunction -PT to evaluate and treat -pt is blind    Code Status: Full  Family Communication: Family at bedside  Disposition Plan: Will stay another midnight due to possible dialysis in the am to remove more fluid   Consultants:  nephrology  Procedures:  none  Antimicrobials:  Not indicated  DVT prophylaxis:  heparin sq 5000 U TID   Objective: Vitals:   06/04/17 2055 06/04/17 2056 06/05/17 0014 06/05/17 0433  BP: (!) 132/49 (!) 140/47 (!) 150/56 126/63  Pulse: 75 75 80 71  Resp:  18 18 16   Temp:  98.9 F (37.2 C) 98.6 F (37 C) 98.8 F (37.1 C)  TempSrc:  Oral Oral Oral  SpO2:  98% 94% 96%  Weight:    83.6 kg (184 lb 6.4 oz)  Height:        Intake/Output Summary (Last 24 hours) at 06/05/2017 0717 Last data filed at 06/05/2017 0100 Gross per 24 hour  Intake 340 ml  Output 3000 ml  Net -2660 ml   Filed Weights   06/04/17 1427 06/04/17 1832 06/05/17 0433  Weight: 88.6 kg (195 lb 5.2 oz) 85.5 kg (188 lb 7.9 oz) 83.6 kg (184 lb 6.4 oz)    Exam:   General:  66 yo  CF WD WN in NAD, bilateral blindness, right sclera erythematous  Cardiovascular: RRR with no rubs gallops   Respiratory: Rales at bases bilaterally  Abdomen: soft NT ND NBS x 4 quadrants   Musculoskeletal: Moves all limbs freely. 2+ pitting edema in LE bilaterally which is improving.  Skin: No noted open lesions  Psychiatry: Mood is appropriate for condition and setting   Data Reviewed: CBC: Recent Labs  Lab 06/03/17 1913 06/04/17 0425 06/05/17 0322  WBC 6.8 7.1 6.5  HGB 10.2* 10.1* 9.4*  HCT 29.8* 30.2* 29.8*  MCV 90.9 91.2 94.0  PLT 228 251 408   Basic Metabolic Panel: Recent Labs  Lab 06/03/17 1913 06/04/17 0425 06/05/17 0322  NA 134* 133* 136  K 3.8 3.9 3.9  CL 95* 94* 97*  CO2 24 25 30   GLUCOSE 158* 166* 150*  BUN 18 21* 12  CREATININE 6.19* 7.02* 5.19*  CALCIUM 9.1 9.1 8.7*   GFR: Estimated Creatinine Clearance: 11.7 mL/min (A) (by C-G formula based on SCr of 5.19 mg/dL (H)). Liver Function Tests: Recent Labs  Lab 06/05/17 0322  AST 24  ALT 24  ALKPHOS 73  BILITOT 0.7  PROT 6.6  ALBUMIN 3.4*   No results for input(s): LIPASE, AMYLASE in the last 168 hours. No results for input(s): AMMONIA in the last 168 hours. Coagulation Profile: No results for input(s): INR, PROTIME in the last 168 hours. Cardiac Enzymes: No results for input(s): CKTOTAL, CKMB, CKMBINDEX, TROPONINI in the last 168 hours. BNP (last 3 results) No results for input(s): PROBNP in the last 8760 hours. HbA1C: Recent Labs    06/05/17 0322  HGBA1C 7.3*   CBG: Recent Labs  Lab 06/04/17 0923 06/04/17 1200 06/04/17 1915 06/04/17 2143 06/05/17 0707  GLUCAP 144* 181* 108* 261* 134*   Lipid Profile: No results for input(s): CHOL, HDL, LDLCALC, TRIG, CHOLHDL, LDLDIRECT in the last 72 hours. Thyroid Function Tests: No results for input(s): TSH, T4TOTAL, FREET4, T3FREE, THYROIDAB in the last 72 hours. Anemia Panel: No results for input(s): VITAMINB12, FOLATE, FERRITIN,  TIBC, IRON, RETICCTPCT in the last 72 hours. Urine analysis:    Component Value Date/Time   COLORURINE YELLOW 10/27/2016 1943   APPEARANCEUR CLEAR 10/27/2016 1943   LABSPEC 1.012 10/27/2016 1943   LABSPEC 1.025 02/13/2011 1533   PHURINE 8.0 10/27/2016 1943   GLUCOSEU 150 (A) 10/27/2016 1943   HGBUR NEGATIVE 10/27/2016 1943   BILIRUBINUR NEGATIVE 10/27/2016 1943   BILIRUBINUR Negative 02/13/2011 1533   KETONESUR NEGATIVE 10/27/2016 1943   PROTEINUR 100 (A) 10/27/2016 1943   UROBILINOGEN 0.2 04/28/2011 1045   NITRITE NEGATIVE 10/27/2016 1943   LEUKOCYTESUR NEGATIVE 10/27/2016 1943   LEUKOCYTESUR Negative 02/13/2011 1533   Sepsis Labs: @LABRCNTIP (procalcitonin:4,lacticidven:4)  )No results found for this or any previous visit (from the past 240 hour(s)).    Studies: No results found.  Scheduled Meds: . amLODipine  10 mg Oral QHS  . aspirin  81 mg Oral Q lunch  . carvedilol  12.5 mg Oral BID WC  . cloNIDine  0.1 mg Oral BID  . docusate sodium  100 mg Oral Daily  . doxercalciferol  1 mcg Intravenous Q M,W,F-HD  . heparin  2,500 Units  Dialysis Once in dialysis  . heparin  5,000 Units Subcutaneous Q8H  . insulin aspart  0-9 Units Subcutaneous TID WC  . insulin detemir  8 Units Subcutaneous Q lunch  . lanthanum  2,000 mg Oral TID WC  . lisinopril  40 mg Oral QHS  . mouth rinse  15 mL Mouth Rinse BID  . multivitamin  1 tablet Oral Q lunch  . pantoprazole  20 mg Oral Daily  . prednisoLONE acetate  1 drop Right Eye TID AC & HS  . senna  8.6 mg Oral BID    Continuous Infusions: . sodium chloride    . sodium chloride       LOS: 1 day     Kayleen Memos, MD [facility] Pager [phone]  If 7PM-7AM, please contact night-coverage [email or web address] Password [email or web address] 06/05/2017, 7:17 AM

## 2017-06-05 NOTE — Consult Note (Signed)
Renal Service Consult Note [facility]  KAMRI GOTSCH 06/05/2017 [name] Requesting Physician:  Dr Nevada Crane  Reason for Consult:  SOB in ESRD pt HPI: The patient is a 66 y.o. year-old with hx HTN, blind from glaucoma, DM2, systolic CHF and ESRD on HD MWF.  Presented to ED Wed evening , had HD that day but was still SOB.  CXR showed CHF and pt was admitted.  She had extra HD last night and breathing today is "better".  Has had some leg swelling several wks.  Her family says they have been cutting back on the amount of ice and fluids they give her.  Also they say she has been losing weight.  Asked to see for ESRD.   Patient denies any current CP, prod cough, hemoptysis , abd pain,n/v/d.      ROS  denies CP  no joint pain   no HA  no blurry vision  no rash  no diarrhea  no nausea/ vomiting  makes small amts of urine  eats a lot of ice   Past Medical History  Past Medical History:  Diagnosis Date  . Anemia   . Anxiety   . Arthritis    "joints" (06/15/2013)  . Blood transfusion without reported diagnosis   . Breast cancer ([place])    left  . CHF (congestive heart failure) ([place])   . ESRD (end stage renal disease) ([place])    "suppose to start dialysis today" (06/15/2013)  . GERD (gastroesophageal reflux disease)   . Hypertension   . Myalgia 12/31/2011  . Neuropathy 12/31/2011  . Shortness of breath    "lately it's been all the time" (06/15/2013)  . Type II diabetes mellitus ([place])    Past Surgical History  Past Surgical History:  Procedure Laterality Date  . ABDOMINAL HYSTERECTOMY     partial  . ARTERIOVENOUS (AV) FISTULA CREATION-RIGHT BRACHIAL CEPHALIC Right 02/72/5366   Performed by Conrad Bent, MD at [place]  . BREAST BIOPSY Left   . BREAST LUMPECTOMY Left    "and took out some lymph nodes" (06/15/2013)  . CATARACT EXTRACTION W/ ANTERIOR VITRECTOMY Bilateral   . CESAREAN SECTION  1980  . EYE SURGERY Bilateral    laser surgery  . PARS PLANA  VITRECTOMY WITH 25 GAUGE; PARTIAL REMOVAL OF OIL; INFERIOR PERIPHERAL IRIDECTOMY, REFORM ANTERIOR CHAMBER RIGHT EYE Right 05/05/2017   Performed by [name], MD at [place] Bilateral   . REMOVAL OF RIGHT MEDIPORT Right 06/06/2013   Performed by Conrad [employer], MD at [place]  . TONSILLECTOMY     Family History  Family History  Problem Relation Age of Onset  . Diabetes Mother   . Hyperlipidemia Mother   . Hypertension Mother   . Hypertension Father   . Diabetes Sister   . Diabetes Brother   . Hypertension Brother   . Heart attack Brother   . Kidney disease Brother   . Colon cancer Neg Hx   . Colon polyps Neg Hx   . Esophageal cancer Neg Hx   . Gallbladder disease Neg Hx    Social History  reports that  has never smoked. she has never used smokeless tobacco. She reports that she does not drink alcohol or use drugs. Allergies  Allergies  Allergen Reactions  . Oxycodone Other (See Comments)    Hallucinations   . Latex Hives  . Vicodin [Hydrocodone-Acetaminophen] Other (See Comments)    hallucinations   Home medications  Prior to Admission medications   Medication Sig Start Date End Date Taking? Authorizing Provider  acetaminophen (TYLENOL) 500 MG tablet Take 500-1,000 mg by mouth every 4 (four) hours as needed for mild pain, moderate pain, fever or headache.    Yes [provider]  albuterol (PROAIR HFA) 108 (90 Base) MCG/ACT inhaler Inhale 2 puffs into the lungs every 6 (six) hours as needed for wheezing or shortness of breath.   Yes [provider]  albuterol (PROVENTIL) (2.5 MG/3ML) 0.083% nebulizer solution Take 3 mLs (2.5 mg total) by nebulization every 6 (six) hours as needed for wheezing or shortness of breath. 10/30/16  Yes Theodis Blaze, MD  amLODipine (NORVASC) 10 MG tablet Take 10 mg at bedtime by mouth. 06/02/17  Yes [provider]  aspirin 81 MG chewable tablet Chew 81 mg by mouth daily with lunch.    Yes [provider]  B Complex-C-Folic Acid (RENA-VITE RX) 1 MG TABS Take 1 tablet by mouth daily with lunch.  09/29/16  Yes [provider]  carvedilol (COREG) 12.5 MG tablet Take 12.5 mg by mouth 2 (two) times daily with a meal.   Yes [provider]  cloNIDine (CATAPRES) 0.1 MG tablet Take 0.1 mg 2 (two) times daily by mouth. with lunch and at bedtime   Yes [provider]  docusate sodium (COLACE) 100 MG capsule Take 100 mg by mouth daily.   Yes [provider]  doxercalciferol (HECTOROL) 4 MCG/2ML injection Inject 0.5 mLs (1 mcg total) into the vein every Monday, Wednesday, and Friday with hemodialysis. 06/18/13  Yes Vann, Jessica U, DO  fluticasone (FLONASE) 50 MCG/ACT nasal spray Place 1-2 sprays into both nostrils daily as needed for allergies or rhinitis.    Yes [provider]  insulin aspart (NOVOLOG FLEXPEN) 100 UNIT/ML FlexPen Inject 10-16 Units into the skin See admin instructions. Sliding scale    Yes [provider]  Insulin Detemir (LEVEMIR FLEXTOUCH) 100 UNIT/ML Pen Inject 12 Units into the skin daily with lunch.   Yes [provider]  lanthanum (FOSRENOL) 1000 MG chewable tablet Chew 2,000 mg by mouth 3 (three) times daily with meals.    Yes [provider]  lisinopril (PRINIVIL,ZESTRIL) 40 MG tablet Take 40 mg by mouth at bedtime.    Yes [provider]  omeprazole (PRILOSEC OTC) 20 MG tablet Take 20 mg by mouth 2 (two) times daily as needed (reflux).    Yes [provider]  prednisoLONE acetate (PRED FORTE) 1 % ophthalmic suspension Place 1 drop into the right eye 4 (four) times daily. 04/27/17  Yes [provider]  traMADol (ULTRAM) 50 MG tablet Take 50 mg every 6 (six) hours as needed by mouth for moderate pain or severe pain.    Yes [provider]  benzonatate (TESSALON PERLES) 100 MG capsule Take 1 capsule (100 mg total) by mouth 3 (three) times daily as needed for  cough. Patient not taking: Reported on 05/05/2017 08/28/16   Mackuen, Fredia Sorrow, MD  darbepoetin (ARANESP) 200 MCG/0.4ML SOLN injection Inject 0.4 mLs (200 mcg total) into the vein every Wednesday with hemodialysis. 06/22/13   Geradine Girt, DO  diazepam (VALIUM) 5 MG tablet Take 1 tab one hour prior to MRI, repeat as needed Patient not taking: Reported on 06/04/2017 01/02/17   Pete Pelt, PA-C  pantoprazole (PROTONIX) 20 MG tablet Take 1 tablet (20 mg total) by mouth daily. Patient not taking: Reported on 05/05/2017 02/08/15   Carlean Purl,  Ofilia Neas, MD  sennosides (SENOKOT) 8.8 MG/5ML syrup Take 5 mLs by mouth 2 (two) times daily. Patient not taking: Reported on 05/05/2017 10/30/16   Theodis Blaze, MD   Liver Function Tests Recent Labs  Lab 06/05/17 0322  AST 24  ALT 24  ALKPHOS 73  BILITOT 0.7  PROT 6.6  ALBUMIN 3.4*   No results for input(s): LIPASE, AMYLASE in the last 168 hours. CBC Recent Labs  Lab 06/03/17 1913 06/04/17 0425 06/05/17 0322  WBC 6.8 7.1 6.5  HGB 10.2* 10.1* 9.4*  HCT 29.8* 30.2* 29.8*  MCV 90.9 91.2 94.0  PLT 228 251 423   Basic Metabolic Panel Recent Labs  Lab 06/03/17 1913 06/04/17 0425 06/05/17 0322  NA 134* 133* 136  K 3.8 3.9 3.9  CL 95* 94* 97*  CO2 24 25 30   GLUCOSE 158* 166* 150*  BUN 18 21* 12  CREATININE 6.19* 7.02* 5.19*  CALCIUM 9.1 9.1 8.7*   Iron/TIBC/Ferritin/ %Sat    Component Value Date/Time   IRON 34 (L) 06/15/2013 1730   TIBC 238 (L) 06/15/2013 1730   FERRITIN 333 (H) 06/15/2013 1730   IRONPCTSAT 14 (L) 06/15/2013 1730   IRONPCTSAT 23 12/31/2011 1056    Vitals:   06/05/17 0433 06/05/17 0755 06/05/17 1222 06/05/17 1230  BP: 126/63 (!) 176/71 (!) 148/72 (!) 148/72  Pulse: 71 78  69  Resp: 16 18  17   Temp: 98.8 F (37.1 C) 98.7 F (37.1 C)  98.6 F (37 C)  TempSrc: Oral Oral  Oral  SpO2: 96% 98%  98%  Weight: 83.6 kg (184 lb 6.4 oz)     Height:       Exam Gen elderly blind AAF, friend at her sid No rash,  cyanosis or gangrene Sclera anicteric, throat clear  No jvd or bruits Chest mild bibasilar rales, no wheezing RRR no MRG Abd soft ntnd no mass or ascites +bs GU defer MS no joint effusions or deformity Ext 2+ bilat LE pitting edema / no wounds or ulcers Neuro is alert, Ox 3 , nf RUA AVF+bruit    Dialysis: [place] MWF 4h   85.5kg   2/2.25   Hep 2500   RUA AVF -Hectorol 3 mcg IV TIW -Mircera 75 mcg IV q 4 weeks (last dose 06/03/17 HGB 10 05/27/17)    Impression: 1. SOB/ pulm edema/ vol overload - breathing better after 3L off last night. Still edematous, vol overloaded.  Has lost significant body wt, plan HD again today and tomorrow.  2. ESRD on HD MWF 3. Blind 4. HTN 5. DM 6. Syst/ diast CHF    Plan - as above  Kelly Splinter MD [facility] pager [phone]   06/05/2017, 1:35 PM

## 2017-06-05 NOTE — Plan of Care (Signed)
  Progressing Clinical Measurements: Will remain free from infection 06/05/2017 0132 - Progressing by Ruben Im, RN Diagnostic test results will improve 06/05/2017 0132 - Progressing by Ruben Im, RN Respiratory complications will improve 06/05/2017 0132 - Progressing by Ruben Im, RN Activity: Risk for activity intolerance will decrease 06/05/2017 0132 - Progressing by Ruben Im, RN Nutrition: Adequate nutrition will be maintained 06/05/2017 0132 - Progressing by Ruben Im, RN Elimination: Will not experience complications related to bowel motility 06/05/2017 0132 - Progressing by Ruben Im, RN Pain Managment: General experience of comfort will improve 06/05/2017 0132 - Progressing by Ruben Im, RN Safety: Ability to remain free from injury will improve 06/05/2017 0132 - Progressing by Ruben Im, RN Skin Integrity: Risk for impaired skin integrity will decrease 06/05/2017 0132 - Progressing by Ruben Im, RN

## 2017-06-05 NOTE — Progress Notes (Signed)
Paged Dr. Nevada Crane regarding pain medication for patient.   Dr. Nevada Crane returned page- will place benadryl/reglan/toradol PRN for pain.

## 2017-06-05 NOTE — Progress Notes (Signed)
Patient arrived to unit per bed.  Reviewed treatment plan and this RN agrees.  Report received from bedside RN, Anderson Malta.  Consent verified.  Patient A & o X 4. Lung sounds diminished to ausculation in all fields. BLE 2+ pitting edema. Cardiac: NSR.  Prepped RUAVF with alcohol and cannulated with two 15 gauge needles.  Pulsation of blood noted.  Flushed access well with saline per protocol.  Connected and secured lines and initiated tx at 1741.  UF goal of 3000 mL and net fluid removal of 2500 mL.  Will continue to monitor.

## 2017-06-06 DIAGNOSIS — R51 Headache: Secondary | ICD-10-CM

## 2017-06-06 DIAGNOSIS — Z794 Long term (current) use of insulin: Secondary | ICD-10-CM

## 2017-06-06 DIAGNOSIS — E1122 Type 2 diabetes mellitus with diabetic chronic kidney disease: Secondary | ICD-10-CM

## 2017-06-06 LAB — BASIC METABOLIC PANEL
ANION GAP: 9 (ref 5–15)
BUN: 14 mg/dL (ref 6–20)
CALCIUM: 8.8 mg/dL — AB (ref 8.9–10.3)
CO2: 29 mmol/L (ref 22–32)
Chloride: 97 mmol/L — ABNORMAL LOW (ref 101–111)
Creatinine, Ser: 5.23 mg/dL — ABNORMAL HIGH (ref 0.44–1.00)
GFR, EST AFRICAN AMERICAN: 9 mL/min — AB (ref >60)
GFR, EST NON AFRICAN AMERICAN: 8 mL/min — AB (ref >60)
Glucose, Bld: 115 mg/dL — ABNORMAL HIGH (ref 65–99)
POTASSIUM: 4.2 mmol/L (ref 3.5–5.1)
SODIUM: 135 mmol/L (ref 135–145)

## 2017-06-06 LAB — CBC
HCT: 30 % — ABNORMAL LOW (ref 36.0–46.0)
Hemoglobin: 9.7 g/dL — ABNORMAL LOW (ref 12.0–15.0)
MCH: 30.4 pg (ref 26.0–34.0)
MCHC: 32.3 g/dL (ref 30.0–36.0)
MCV: 94 fL (ref 78.0–100.0)
PLATELETS: 230 10*3/uL (ref 150–400)
RBC: 3.19 MIL/uL — AB (ref 3.87–5.11)
RDW: 18 % — AB (ref 11.5–15.5)
WBC: 6.9 10*3/uL (ref 4.0–10.5)

## 2017-06-06 LAB — GLUCOSE, CAPILLARY: Glucose-Capillary: 113 mg/dL — ABNORMAL HIGH (ref 65–99)

## 2017-06-06 MED ORDER — AMLODIPINE BESYLATE 10 MG PO TABS: 10.0000 mg | ORAL_TABLET | Freq: Every day | ORAL | Status: DC

## 2017-06-06 MED ORDER — KETOROLAC TROMETHAMINE 30 MG/ML IJ SOLN: 30.0000 mg | Freq: Once | INTRAMUSCULAR | Status: DC

## 2017-06-06 MED ORDER — PENTAFLUOROPROP-TETRAFLUOROETH EX AERO: 1.0000 "application " | INHALATION_SPRAY | CUTANEOUS | Status: DC | PRN

## 2017-06-06 MED ORDER — LISINOPRIL 20 MG PO TABS
20.0000 mg | ORAL_TABLET | Freq: Every day | ORAL | Status: DC
Start: 2017-06-06 — End: 2017-06-06

## 2017-06-06 MED ORDER — KETOROLAC TROMETHAMINE 15 MG/ML IJ SOLN: 15.0000 mg | Freq: Once | INTRAMUSCULAR | Status: DC

## 2017-06-06 MED ORDER — LISINOPRIL 40 MG PO TABS: 40.0000 mg | ORAL_TABLET | Freq: Every day | ORAL | Status: DC

## 2017-06-06 MED ORDER — KETOROLAC TROMETHAMINE 10 MG PO TABS: 10.0000 mg | ORAL_TABLET | Freq: Three times a day (TID) | ORAL | 0 refills | Status: DC

## 2017-06-06 MED ORDER — HEPARIN SODIUM (PORCINE) 1000 UNIT/ML DIALYSIS
1000.0000 [IU] | INTRAMUSCULAR | Status: DC | PRN
  Filled 2017-06-06: qty 1

## 2017-06-06 MED ORDER — ALTEPLASE 2 MG IJ SOLR: 2.0000 mg | Freq: Once | INTRAMUSCULAR | Status: DC | PRN

## 2017-06-06 MED ORDER — SODIUM CHLORIDE 0.9 % IV SOLN: 100.0000 mL | INTRAVENOUS | Status: DC | PRN

## 2017-06-06 MED ORDER — KETOROLAC TROMETHAMINE 10 MG PO TABS: 10.0000 mg | ORAL_TABLET | Freq: Three times a day (TID) | ORAL | Status: DC

## 2017-06-06 MED ORDER — LIDOCAINE HCL (PF) 1 % IJ SOLN: 5.0000 mL | INTRAMUSCULAR | Status: DC | PRN

## 2017-06-06 MED ORDER — HEPARIN SODIUM (PORCINE) 1000 UNIT/ML DIALYSIS: 2600.0000 [IU] | Freq: Once | INTRAMUSCULAR | Status: DC

## 2017-06-06 MED ORDER — DIPHENHYDRAMINE HCL 50 MG/ML IJ SOLN
INTRAMUSCULAR | Status: AC
  Administered 2017-06-06: 12.5 mg via INTRAVENOUS
  Filled 2017-06-06: qty 1

## 2017-06-06 MED ORDER — DIPHENHYDRAMINE HCL 50 MG/ML IJ SOLN
12.5000 mg | Freq: Once | INTRAMUSCULAR | Status: AC
  Administered 2017-06-06: 12.5 mg via INTRAVENOUS

## 2017-06-06 MED ORDER — KETOROLAC TROMETHAMINE 15 MG/ML IJ SOLN
INTRAMUSCULAR | Status: AC
  Administered 2017-06-06: 15 mg
  Filled 2017-06-06: qty 1

## 2017-06-06 MED ORDER — AMLODIPINE BESYLATE 5 MG PO TABS: 5.0000 mg | ORAL_TABLET | Freq: Every day | ORAL | Status: DC

## 2017-06-06 MED ORDER — DIPHENHYDRAMINE HCL 50 MG/ML IJ SOLN: 25.0000 mg | Freq: Once | INTRAMUSCULAR | Status: DC

## 2017-06-06 MED ORDER — LIDOCAINE-PRILOCAINE 2.5-2.5 % EX CREA
1.0000 "application " | TOPICAL_CREAM | CUTANEOUS | Status: DC | PRN
  Filled 2017-06-06: qty 5

## 2017-06-06 NOTE — Discharge Summary (Signed)
Pt got discharged to home, discharge instructions provided and patient and her sister showed understanding to it, IV taken out,Telemonitor DC,pt left unit in wheelchair with all of the belongings accompanied with a family member (Sister), Rolling walker provided before she left

## 2017-06-06 NOTE — Progress Notes (Signed)
[name] Progress Note  Subjective: having a headache.  SOB is a bit better.   Vitals:   06/06/17 0830 06/06/17 0900 06/06/17 0930 06/06/17 1000  BP: (!) 158/70 (!) 153/70 (!) 167/77 (!) 182/80  Pulse: 72 73 72 76  Resp:      Temp:      TempSrc:      SpO2:      Weight:      Height:        Inpatient medications: . amLODipine  5 mg Oral QHS  . aspirin  81 mg Oral Q lunch  . carvedilol  12.5 mg Oral BID WC  . cloNIDine  0.1 mg Oral BID  . diphenhydrAMINE  25 mg Intravenous Once  . docusate sodium  100 mg Oral Daily  . doxercalciferol  1 mcg Intravenous Q M,W,F-HD  . [START ON 06/07/2017] heparin  2,600 Units Dialysis Once in dialysis  . heparin  5,000 Units Subcutaneous Q8H  . insulin aspart  0-9 Units Subcutaneous TID WC  . insulin detemir  8 Units Subcutaneous Q lunch  . ketorolac  30 mg Intravenous Once  . lanthanum  2,000 mg Oral TID WC  . lisinopril  20 mg Oral QHS  . mouth rinse  15 mL Mouth Rinse BID  . multivitamin  1 tablet Oral Q lunch  . pantoprazole  20 mg Oral Daily  . prednisoLONE acetate  1 drop Right Eye TID AC & HS  . senna  8.6 mg Oral BID   . sodium chloride    . sodium chloride     sodium chloride, sodium chloride, acetaminophen, albuterol, alteplase, benzonatate, fluticasone, heparin, lidocaine (PF), lidocaine-prilocaine, morphine injection, ondansetron **OR** ondansetron (ZOFRAN) IV, pentafluoroprop-tetrafluoroeth, traMADol, zolpidem  Exam: Gen elderly blind AAF No jvd or bruits Chest mild bibasilar rales, no wheezing RRR no MRG Abd soft ntnd no mass or ascites +bs Ext 2+ bilat LE pitting edema Neuro is alert, Ox 3 , nf RUA AVF+bruit  Home BP: norvasc 10/ coreg 12.5 bid/ clonidine 0.1 bid/ lisinopril 40 hs  Dialysis: [place] MWF 4h   85.5kg   2/2.25   Hep 2500   RUA AVF -Hectorol 3 mcg IV TIW -Mircera 75 mcg IV q 4 weeks (last dose 06/03/17 HGB 10 05/27/17)    Impression: 1. SOB/ pulm edema/ vol overload -  breathing better. Still edematous, vol overload due to lean body wt loss. HD again today, UF as tol and lowering dry wt.  2. ESRD on HD MWF 3. Blind 4. HTN 5. DM 6. Syst/ diast CHF   Plan - extra HD today.     Kelly Splinter MD [facility] pager [phone]   06/06/2017, 10:19 AM   Recent Labs  Lab 06/05/17 0322 06/05/17 1802 06/06/17 0557  NA 136 135 135  K 3.9 4.2 4.2  CL 97* 99* 97*  CO2 30 28 29   GLUCOSE 150* 113* 115*  BUN 12 19 14   CREATININE 5.19* 6.44* 5.23*  CALCIUM 8.7* 8.6* 8.8*  PHOS  --  3.7  --    Recent Labs  Lab 06/05/17 0322 06/05/17 1802  AST 24  --   ALT 24  --   ALKPHOS 73  --   BILITOT 0.7  --   PROT 6.6  --   ALBUMIN 3.4* 3.1*   Recent Labs  Lab 06/05/17 0322 06/05/17 1802 06/06/17 0557  WBC 6.5 6.4 6.9  HGB 9.4* 8.6* 9.7*  HCT 29.8* 27.4* 30.0*  MCV 94.0 94.2  94.0  PLT 224 212 230   Iron/TIBC/Ferritin/ %Sat    Component Value Date/Time   IRON 34 (L) 06/15/2013 1730   TIBC 238 (L) 06/15/2013 1730   FERRITIN 333 (H) 06/15/2013 1730   IRONPCTSAT 14 (L) 06/15/2013 1730   IRONPCTSAT 23 12/31/2011 1056

## 2017-06-06 NOTE — Progress Notes (Signed)
Pt left for HD dialysis immediately after morning handoff, RN quickly assess the patient, not in distress and no any complain of pain.

## 2017-06-06 NOTE — Care Management Note (Signed)
Case Management Note  Patient Details  Name: NYJAH SCHWAKE MRN: [id number] Date of Birth: [birth year]  Subjective/Objective:                 RW to be delivered to room prior to DC. No other CM needs identified at this time. CM signing off   Action/Plan:   Expected Discharge Date:  06/06/17               Expected Discharge Plan:  Home/Self Care  In-House Referral:     Discharge planning Services  CM Consult  Post Acute Care Choice:  Durable Medical Equipment Choice offered to:     DME Arranged:  Walker rolling DME Agency:  [place]:    [place]:     Status of Service:  Completed, signed off  If discussed at [place] of Stay Meetings, dates discussed:    Additional Comments:  Carles Collet, RN 06/06/2017, 1:50 PM

## 2017-06-06 NOTE — Progress Notes (Signed)
SATURATION QUALIFICATIONS: (This note is used to comply with regulatory documentation for home oxygen)  Patient Saturations on Room Air at Rest = 94%  Patient Saturations on Room Air while Ambulating = 94%  Please briefly explain why patient needs home oxygen:

## 2017-06-06 NOTE — Discharge Summary (Addendum)
Discharge Summary  [name] JSH:[id number] DOB: 01/02/1951  PCP: Prince Solian, MD  Admit date: 06/04/2017 Discharge date: 06/06/2017  Time spent: 25 minutes  Recommendations for Outpatient Follow-up:  1. Follow up with PCP within a week 2. Follow up with ophthalmologist within a week 3. Follow up nephrology and keep your HD appointments 4. Take your medications as prescribed  Discharge Diagnoses:  Active Hospital Problems   Diagnosis Date Noted  . Fluid overload 06/04/2017  . ESRD on dialysis ([place]) 06/04/2017  . Blindness of both eyes 06/04/2017  . Glaucoma 06/04/2017  . Pulmonary edema 06/04/2017  . GERD (gastroesophageal reflux disease) 10/27/2016  . Essential hypertension 10/27/2016  . Acute on chronic respiratory failure with hypoxia ([place]) 10/27/2016  . Acute on chronic combined systolic and diastolic CHF (congestive heart failure) ([place]) 06/15/2013  . Anemia 06/15/2013  . Type II diabetes mellitus with renal manifestations ([place]) 06/15/2013    Resolved Hospital Problems  No resolved problems to display.    Discharge Condition: Stable  Diet recommendation: Renal dialysis diet with low salt  Vitals:   06/06/17 1022 06/06/17 1151  BP: (!) 180/85 (!) 173/95  Pulse: 82 85  Resp: 18 20  Temp: 98.5 F (36.9 C) 99.4 F (37.4 C)  SpO2: 99% 99%    History of present illness:  [name] is a 66 year old African [place] female with PMH significant forbilateralblindness due to glaucoma, hypertension, diabetes mellitus, GERD, anxiety, ESRD-HD(MWF), anemia, breast cancer, who presents with shortness of breathon 06/04/17. She endorses recent moderate headache 7/10 like a band around her head since yesterday at dialysis. Reports dialysis yesterday with 2.5L removed. Headache is recurrent today. 5/10 and like a band. Reports she was recently started on home O2 and not sure how many liter O2 she uses at home. Patient had dialysis 11/15, 11/16, and 06/06/17 with  removal of at least 5 kg of fluid. Normal dry weight 85 kg. Today 06/06/17 dry weight is 82 kg. O2 saturation on room air 92%.  Hospital course complicated by intermittent headaches that are associated with right eye discomfort. She takes drops for it and has a follow up appointment with her ophthalmologist on Monday 06/08/17. States her right eye will eventually be removed.   Patient advised to keep appointment with her ophthalmologist on Monday 06/08/17, to keep her dialysis appointments, and to take her medications as prescribed.  Patient understands and agrees to plan.    Hospital Course:  Principal Problem:   Fluid overload Active Problems:   Anemia   Acute on chronic combined systolic and diastolic CHF (congestive heart failure) (H[facility])   Type II diabetes mellitus with renal manifestations ([facility])   Acute on chronic respiratory failure with hypoxia ([facility])   Essential hypertension   GERD (gastroesophageal reflux disease)   ESRD on dialysis ([place])   Blindness of both eyes   Glaucoma   Pulmonary edema  Acute on chronichypoxicrespiratory failure2/2to fluid overload vs acutepulmonary edemavsacute on chronic combined systolic and diastolic CHF: -Resolved -Patient had dialysis 11/15, 11/16, and 06/06/17 with removal of at least 5 kg of fluid. Normal dry weight 85 kg. Today 06/06/17 dry weight is 82 kg. O2 saturation on room air 92%. -pulmonary edemabychest x-ray 06/04/17. -2D echo on4/12/18 showed EF of 45-50 percent with grade 2 diastolic dysfunction.  ESRD (end stage renal disease) on dialysis (MWF): -nephrology consulted for hemodialysis -HD 11/15, 11/16, 06/06/17  Anemia of chronic disease: -Most likely 2/2 to ESRD -stable; no sign of bleeding  Essential hypertension: -  BP stable -amlodipine, Coreg, clonidine, lisinopril  Diabetes mellitus with renal complicationswithESRD: -S0Y3.0 (06/05/17), poorlycontroled. -[place] -DecreasedLevemirdose from 12-8 units daily -SSI -stopped levemir due to hypoglycemia with capillary BS 62  GERD: -Protonix  Blindness of both eyes: -fall precaution -Follow up with ophthalmologist on Monday 06/08/17  Chronic headache most likely 2/2 to severe right eye glaucoma -Toradol po 10 mg to use as needed TID until she sees her ophthalmologist on Monday 06/08/17 -migraine cocktail given: IV toradol once, IV benadryl once and IV reglan before and after dialysis and it relieved her headaches.  Ambulatory dysfunction -PT evaluated and recommended roller walker with 5" wheels -pt is blind   Procedures: None  Consultations:  Nephrology for dialysis  Discharge Exam: BP (!) 173/95 (BP Location: Left Arm)   Pulse 85   Temp 99.4 F (37.4 C) (Oral)   Resp 20   Ht 5\' 5"  (1.651 m)   Wt 80.7 kg (177 lb 14.6 oz)   SpO2 99%   BMI 29.61 kg/m   General: 66 yo AAF WD WN NAD A&O x3 Cardiovascular: RRR with no murmurs rubs or gallops Respiratory: CTA with no wheezes, rales or rhonchi  Discharge Instructions You were cared for by a hospitalist during your hospital stay. If you have any questions about your discharge medications or the care you received while you were in the hospital after you are discharged, you can call the unit and asked to speak with the hospitalist on call if the hospitalist that took care of you is not available. Once you are discharged, your primary care physician will handle any further medical issues. Please note that NO REFILLS for any discharge medications will be authorized once you are discharged, as it is imperative that you return to your primary care physician (or establish a relationship with a primary care physician if you do not have one) for your aftercare needs so that they can reassess your need for medications and monitor your lab values.  Discharge Instructions    Walker rolling   Complete by:  As directed      Allergies as  of 06/06/2017      Reactions   Oxycodone Other (See Comments)   Hallucinations    Latex Hives   Vicodin [hydrocodone-acetaminophen] Other (See Comments)   hallucinations      Medication List    STOP taking these medications   acetaminophen 500 MG tablet Commonly known as:  TYLENOL   diazepam 5 MG tablet Commonly known as:  VALIUM   LEVEMIR FLEXTOUCH 100 UNIT/ML Pen Generic drug:  Insulin Detemir   pantoprazole 20 MG tablet Commonly known as:  PROTONIX   traMADol 50 MG tablet Commonly known as:  ULTRAM     TAKE these medications   albuterol (2.5 MG/3ML) 0.083% nebulizer solution Commonly known as:  PROVENTIL Take 3 mLs (2.5 mg total) by nebulization every 6 (six) hours as needed for wheezing or shortness of breath. What changed:  Another medication with the same name was removed. Continue taking this medication, and follow the directions you see here.   amLODipine 10 MG tablet Commonly known as:  NORVASC Take 10 mg at bedtime by mouth.   aspirin 81 MG chewable tablet Chew 81 mg by mouth daily with lunch.   benzonatate 100 MG capsule Commonly known as:  TESSALON PERLES Take 1 capsule (100 mg total) by mouth 3 (three) times daily as needed for cough.   carvedilol 12.5 MG tablet Commonly known as:  COREG Take 12.5 mg by mouth 2 (two) times daily with a meal.   cloNIDine 0.1 MG tablet Commonly known as:  CATAPRES Take 0.1 mg 2 (two) times daily by mouth. with lunch and at bedtime   darbepoetin 200 MCG/0.4ML Soln injection Commonly known as:  ARANESP Inject 0.4 mLs (200 mcg total) into the vein every Wednesday with hemodialysis.   docusate sodium 100 MG capsule Commonly known as:  COLACE Take 100 mg by mouth daily.   doxercalciferol 4 MCG/2ML injection Commonly known as:  HECTOROL Inject 0.5 mLs (1 mcg total) into the vein every Monday, Wednesday, and Friday with hemodialysis.   fluticasone 50 MCG/ACT nasal spray Commonly known as:  FLONASE Place 1-2  sprays into both nostrils daily as needed for allergies or rhinitis.   ketorolac 10 MG tablet Commonly known as:  TORADOL Take 1 tablet (10 mg total) every 8 (eight) hours by mouth.   lanthanum 1000 MG chewable tablet Commonly known as:  FOSRENOL Chew 2,000 mg by mouth 3 (three) times daily with meals.   lisinopril 40 MG tablet Commonly known as:  PRINIVIL,ZESTRIL Take 40 mg by mouth at bedtime.   NOVOLOG FLEXPEN 100 UNIT/ML FlexPen Generic drug:  insulin aspart Inject 10-16 Units into the skin See admin instructions. Sliding scale   omeprazole 20 MG tablet Commonly known as:  PRILOSEC OTC Take 20 mg by mouth 2 (two) times daily as needed (reflux).   prednisoLONE acetate 1 % ophthalmic suspension Commonly known as:  PRED FORTE Place 1 drop into the right eye 4 (four) times daily.   RENA-VITE RX 1 MG Tabs Take 1 tablet by mouth daily with lunch.   sennosides 8.8 MG/5ML syrup Commonly known as:  SENOKOT Take 5 mLs by mouth 2 (two) times daily.      Allergies  Allergen Reactions  . Oxycodone Other (See Comments)    Hallucinations   . Latex Hives  . Vicodin [Hydrocodone-Acetaminophen] Other (See Comments)    hallucinations      The results of significant diagnostics from this hospitalization (including imaging, microbiology, ancillary and laboratory) are listed below for reference.    Significant Diagnostic Studies: Dg Chest 2 View  Result Date: 06/03/2017 CLINICAL DATA:  Shortness of breath EXAM: CHEST  2 VIEW COMPARISON:  10/27/2016 FINDINGS: Cardiomegaly with vascular congestion. Increased interstitial opacity consistent with pulmonary edema. No large effusion. Aortic atherosclerosis. No pneumothorax. Contrast or radiopaque material in the colon. IMPRESSION: Cardiomegaly with vascular congestion and mild to moderate interstitial pulmonary edema. Electronically Signed   By: Donavan Foil M.D.   On: 06/03/2017 19:53    Microbiology: No results found for this or  any previous visit (from the past 240 hour(s)).   Labs: Basic Metabolic Panel: Recent Labs  Lab 06/03/17 1913 06/04/17 0425 06/05/17 0322 06/05/17 1802 06/06/17 0557  NA 134* 133* 136 135 135  K 3.8 3.9 3.9 4.2 4.2  CL 95* 94* 97* 99* 97*  CO2 24 25 30 28 29   GLUCOSE 158* 166* 150* 113* 115*  BUN 18 21* 12 19 14   CREATININE 6.19* 7.02* 5.19* 6.44* 5.23*  CALCIUM 9.1 9.1 8.7* 8.6* 8.8*  PHOS  --   --   --  3.7  --    Liver Function Tests: Recent Labs  Lab 06/05/17 0322 06/05/17 1802  AST 24  --   ALT 24  --   ALKPHOS 73  --   BILITOT 0.7  --   PROT 6.6  --   ALBUMIN 3.4*  3.1*   No results for input(s): LIPASE, AMYLASE in the last 168 hours. No results for input(s): AMMONIA in the last 168 hours. CBC: Recent Labs  Lab 06/03/17 1913 06/04/17 0425 06/05/17 0322 06/05/17 1802 06/06/17 0557  WBC 6.8 7.1 6.5 6.4 6.9  HGB 10.2* 10.1* 9.4* 8.6* 9.7*  HCT 29.8* 30.2* 29.8* 27.4* 30.0*  MCV 90.9 91.2 94.0 94.2 94.0  PLT 228 251 224 212 230   Cardiac Enzymes: No results for input(s): CKTOTAL, CKMB, CKMBINDEX, TROPONINI in the last 168 hours. BNP: BNP (last 3 results) Recent Labs    10/27/16 1452 06/05/17 0322  BNP 2,605.8* >4,500.0*    ProBNP (last 3 results) No results for input(s): PROBNP in the last 8760 hours.  CBG: Recent Labs  Lab 06/05/17 0707 06/05/17 1138 06/05/17 1625 06/05/17 2100 06/06/17 1128  GLUCAP 134* 222* 151* 64* 113*       Signed:  Kayleen Memos, MD [facility] 06/06/2017, 12:54 PM

## 2017-06-07 DIAGNOSIS — D509 Iron deficiency anemia, unspecified: Secondary | ICD-10-CM | POA: Diagnosis not present

## 2017-06-07 DIAGNOSIS — D631 Anemia in chronic kidney disease: Secondary | ICD-10-CM | POA: Diagnosis not present

## 2017-06-07 DIAGNOSIS — E1129 Type 2 diabetes mellitus with other diabetic kidney complication: Secondary | ICD-10-CM | POA: Diagnosis not present

## 2017-06-07 DIAGNOSIS — N186 End stage renal disease: Secondary | ICD-10-CM | POA: Diagnosis not present

## 2017-06-07 DIAGNOSIS — N2581 Secondary hyperparathyroidism of renal origin: Secondary | ICD-10-CM | POA: Diagnosis not present

## 2017-06-09 ENCOUNTER — Ambulatory Visit: Payer: Medicare Other | Admitting: Podiatry

## 2017-06-09 DIAGNOSIS — E1129 Type 2 diabetes mellitus with other diabetic kidney complication: Secondary | ICD-10-CM | POA: Diagnosis not present

## 2017-06-09 DIAGNOSIS — N2581 Secondary hyperparathyroidism of renal origin: Secondary | ICD-10-CM | POA: Diagnosis not present

## 2017-06-09 DIAGNOSIS — D509 Iron deficiency anemia, unspecified: Secondary | ICD-10-CM | POA: Diagnosis not present

## 2017-06-09 DIAGNOSIS — N186 End stage renal disease: Secondary | ICD-10-CM | POA: Diagnosis not present

## 2017-06-09 DIAGNOSIS — D631 Anemia in chronic kidney disease: Secondary | ICD-10-CM | POA: Diagnosis not present

## 2017-06-12 ENCOUNTER — Other Ambulatory Visit: Payer: Self-pay

## 2017-06-12 DIAGNOSIS — E1129 Type 2 diabetes mellitus with other diabetic kidney complication: Secondary | ICD-10-CM | POA: Diagnosis not present

## 2017-06-12 DIAGNOSIS — D509 Iron deficiency anemia, unspecified: Secondary | ICD-10-CM | POA: Diagnosis not present

## 2017-06-12 DIAGNOSIS — D631 Anemia in chronic kidney disease: Secondary | ICD-10-CM | POA: Diagnosis not present

## 2017-06-12 DIAGNOSIS — N2581 Secondary hyperparathyroidism of renal origin: Secondary | ICD-10-CM | POA: Diagnosis not present

## 2017-06-12 DIAGNOSIS — N186 End stage renal disease: Secondary | ICD-10-CM | POA: Diagnosis not present

## 2017-06-12 NOTE — Patient Outreach (Signed)
Patient triggered Red on EMMI Heart Failure Dashboard, notification sent to :  Roshanda Florance, RN. 

## 2017-06-12 NOTE — Patient Outreach (Signed)
[facility]) Care Management  06/12/2017  [name] 07-16-1951 [id number]     EMMI-HF RED ON EMMI ALERT Day # 2 Date: 06/11/17 Red Alert Reason: "Weighed themselves today? No"   Outreach attempt # 1 to patient. No answer at present. RN CM left HIPAA compliant voicemail message along with contact info.         Plan: RN CM will make outreach attempt to patient within one business day if no return call from patient.   Enzo Montgomery, RN,BSN,CCM [place] Management Telephonic Care Management Coordinator Direct Phone: [phone] Toll Free: [phone] Fax: [phone]

## 2017-06-15 ENCOUNTER — Other Ambulatory Visit: Payer: Self-pay

## 2017-06-15 DIAGNOSIS — D631 Anemia in chronic kidney disease: Secondary | ICD-10-CM | POA: Diagnosis not present

## 2017-06-15 DIAGNOSIS — D509 Iron deficiency anemia, unspecified: Secondary | ICD-10-CM | POA: Diagnosis not present

## 2017-06-15 DIAGNOSIS — N186 End stage renal disease: Secondary | ICD-10-CM | POA: Diagnosis not present

## 2017-06-15 DIAGNOSIS — N2581 Secondary hyperparathyroidism of renal origin: Secondary | ICD-10-CM | POA: Diagnosis not present

## 2017-06-15 DIAGNOSIS — E1129 Type 2 diabetes mellitus with other diabetic kidney complication: Secondary | ICD-10-CM | POA: Diagnosis not present

## 2017-06-15 NOTE — Patient Outreach (Signed)
[facility]) Care Management  06/15/2017  ANALAYAH BROOKE March 30, 1951 [id number]   EMMI-HF RED ON EMMI ALERT Day # 2 Date: 06/11/17 Red Alert Reason: "Weighed themselves today? No"    Outreach attempt #2 to patient. No answer at present.     Plan: RN CM will send unsuccessful outreach letter to patient and close case if no response within 10 business days.    Enzo Montgomery, RN,BSN,CCM [place] Management Telephonic Care Management Coordinator Direct Phone: [phone] Toll Free: [phone] Fax: [phone]

## 2017-06-17 DIAGNOSIS — E1129 Type 2 diabetes mellitus with other diabetic kidney complication: Secondary | ICD-10-CM | POA: Diagnosis not present

## 2017-06-17 DIAGNOSIS — D631 Anemia in chronic kidney disease: Secondary | ICD-10-CM | POA: Diagnosis not present

## 2017-06-17 DIAGNOSIS — D509 Iron deficiency anemia, unspecified: Secondary | ICD-10-CM | POA: Diagnosis not present

## 2017-06-17 DIAGNOSIS — N2581 Secondary hyperparathyroidism of renal origin: Secondary | ICD-10-CM | POA: Diagnosis not present

## 2017-06-17 DIAGNOSIS — N186 End stage renal disease: Secondary | ICD-10-CM | POA: Diagnosis not present

## 2017-06-18 ENCOUNTER — Encounter: Payer: Self-pay | Admitting: Podiatry

## 2017-06-18 ENCOUNTER — Ambulatory Visit (INDEPENDENT_AMBULATORY_CARE_PROVIDER_SITE_OTHER): Payer: Medicare Other | Admitting: Podiatry

## 2017-06-18 DIAGNOSIS — B351 Tinea unguium: Secondary | ICD-10-CM | POA: Diagnosis not present

## 2017-06-18 DIAGNOSIS — M79672 Pain in left foot: Secondary | ICD-10-CM | POA: Diagnosis not present

## 2017-06-18 DIAGNOSIS — M79671 Pain in right foot: Secondary | ICD-10-CM

## 2017-06-18 DIAGNOSIS — M21619 Bunion of unspecified foot: Secondary | ICD-10-CM

## 2017-06-18 NOTE — Progress Notes (Signed)
Subjective: 66 y.o. year old blinded female patient presents accompanied by her mother for painful nails.  Patient is scheduled for eye surgery this December. Mother asks to have foot surgery once she has recovered. Stated that her blood sugar is better controlled now. It is at 180 range.  Disability with cancer in breast. Got on Dialysis since cancer treatment, 2014. Diabetic since 1985 and not controlled.  Objective: Dermatologic: Thick yellow deformed nails x 10. Vascular: Pedal pulses are all palpable. Orthopedic: Severe hallux valgus with bunion deformity bilateral. Contracted digit with digital corn 2nd right. Flat medial arch bilateral. Neurologic: All epicritic and tactile sensations grossly intact.  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 Pain in both feet. Diabetic not controlled.  Treatment: All mycotic nails and digital corns debrided.  Reviewed with mother that patient is still a poor candidate for foot surgery.  Return in 3 months or as needed.

## 2017-06-18 NOTE — Patient Instructions (Signed)
Seen for hypertrophic nails. All nails debrided. Return in 3 months or as needed.  

## 2017-06-19 DIAGNOSIS — N2581 Secondary hyperparathyroidism of renal origin: Secondary | ICD-10-CM | POA: Diagnosis not present

## 2017-06-19 DIAGNOSIS — D631 Anemia in chronic kidney disease: Secondary | ICD-10-CM | POA: Diagnosis not present

## 2017-06-19 DIAGNOSIS — N186 End stage renal disease: Secondary | ICD-10-CM | POA: Diagnosis not present

## 2017-06-19 DIAGNOSIS — E1129 Type 2 diabetes mellitus with other diabetic kidney complication: Secondary | ICD-10-CM | POA: Diagnosis not present

## 2017-06-19 DIAGNOSIS — D509 Iron deficiency anemia, unspecified: Secondary | ICD-10-CM | POA: Diagnosis not present

## 2017-06-19 DIAGNOSIS — Z992 Dependence on renal dialysis: Secondary | ICD-10-CM | POA: Diagnosis not present

## 2017-06-22 DIAGNOSIS — D631 Anemia in chronic kidney disease: Secondary | ICD-10-CM | POA: Diagnosis not present

## 2017-06-22 DIAGNOSIS — K2211 Ulcer of esophagus with bleeding: Secondary | ICD-10-CM | POA: Diagnosis not present

## 2017-06-22 DIAGNOSIS — D509 Iron deficiency anemia, unspecified: Secondary | ICD-10-CM | POA: Diagnosis not present

## 2017-06-22 DIAGNOSIS — E1129 Type 2 diabetes mellitus with other diabetic kidney complication: Secondary | ICD-10-CM | POA: Diagnosis not present

## 2017-06-22 DIAGNOSIS — N186 End stage renal disease: Secondary | ICD-10-CM | POA: Diagnosis not present

## 2017-06-22 DIAGNOSIS — I10 Essential (primary) hypertension: Secondary | ICD-10-CM | POA: Diagnosis not present

## 2017-06-22 DIAGNOSIS — N2581 Secondary hyperparathyroidism of renal origin: Secondary | ICD-10-CM | POA: Diagnosis not present

## 2017-06-24 DIAGNOSIS — K2211 Ulcer of esophagus with bleeding: Secondary | ICD-10-CM | POA: Diagnosis not present

## 2017-06-24 DIAGNOSIS — D509 Iron deficiency anemia, unspecified: Secondary | ICD-10-CM | POA: Diagnosis not present

## 2017-06-24 DIAGNOSIS — D631 Anemia in chronic kidney disease: Secondary | ICD-10-CM | POA: Diagnosis not present

## 2017-06-24 DIAGNOSIS — N2581 Secondary hyperparathyroidism of renal origin: Secondary | ICD-10-CM | POA: Diagnosis not present

## 2017-06-24 DIAGNOSIS — I10 Essential (primary) hypertension: Secondary | ICD-10-CM | POA: Diagnosis not present

## 2017-06-24 DIAGNOSIS — N186 End stage renal disease: Secondary | ICD-10-CM | POA: Diagnosis not present

## 2017-06-26 DIAGNOSIS — N2581 Secondary hyperparathyroidism of renal origin: Secondary | ICD-10-CM | POA: Diagnosis not present

## 2017-06-26 DIAGNOSIS — K2211 Ulcer of esophagus with bleeding: Secondary | ICD-10-CM | POA: Diagnosis not present

## 2017-06-26 DIAGNOSIS — D509 Iron deficiency anemia, unspecified: Secondary | ICD-10-CM | POA: Diagnosis not present

## 2017-06-26 DIAGNOSIS — I10 Essential (primary) hypertension: Secondary | ICD-10-CM | POA: Diagnosis not present

## 2017-06-26 DIAGNOSIS — D631 Anemia in chronic kidney disease: Secondary | ICD-10-CM | POA: Diagnosis not present

## 2017-06-26 DIAGNOSIS — N186 End stage renal disease: Secondary | ICD-10-CM | POA: Diagnosis not present

## 2017-06-29 ENCOUNTER — Other Ambulatory Visit: Payer: Self-pay

## 2017-06-29 NOTE — Patient Outreach (Signed)
[facility]) Care Management  06/29/2017  MARIACHRISTINA HOLLE 08/30/1950 [id number]      EMMI-HF RED ON EMMI ALERT Day #2 Date:06/11/17 Red Alert Reason:"Weighed themselves today? No"      Multiple attempts to establish contact with patient without success. No response from letter mailed to patient. Case is being closed at this time.      Plan: RN CM will notify [facility] administrative assistant of case status.   Enzo Montgomery, RN,BSN,CCM [place] Management Telephonic Care Management Coordinator Direct Phone: [phone] Toll Free: [phone] Fax: [phone]

## 2017-07-01 DIAGNOSIS — D631 Anemia in chronic kidney disease: Secondary | ICD-10-CM | POA: Diagnosis not present

## 2017-07-01 DIAGNOSIS — N186 End stage renal disease: Secondary | ICD-10-CM | POA: Diagnosis not present

## 2017-07-01 DIAGNOSIS — K2211 Ulcer of esophagus with bleeding: Secondary | ICD-10-CM | POA: Diagnosis not present

## 2017-07-01 DIAGNOSIS — D509 Iron deficiency anemia, unspecified: Secondary | ICD-10-CM | POA: Diagnosis not present

## 2017-07-01 DIAGNOSIS — N2581 Secondary hyperparathyroidism of renal origin: Secondary | ICD-10-CM | POA: Diagnosis not present

## 2017-07-01 DIAGNOSIS — I10 Essential (primary) hypertension: Secondary | ICD-10-CM | POA: Diagnosis not present

## 2017-07-03 DIAGNOSIS — D509 Iron deficiency anemia, unspecified: Secondary | ICD-10-CM | POA: Diagnosis not present

## 2017-07-03 DIAGNOSIS — N186 End stage renal disease: Secondary | ICD-10-CM | POA: Diagnosis not present

## 2017-07-03 DIAGNOSIS — D631 Anemia in chronic kidney disease: Secondary | ICD-10-CM | POA: Diagnosis not present

## 2017-07-03 DIAGNOSIS — N2581 Secondary hyperparathyroidism of renal origin: Secondary | ICD-10-CM | POA: Diagnosis not present

## 2017-07-03 DIAGNOSIS — K2211 Ulcer of esophagus with bleeding: Secondary | ICD-10-CM | POA: Diagnosis not present

## 2017-07-03 DIAGNOSIS — I10 Essential (primary) hypertension: Secondary | ICD-10-CM | POA: Diagnosis not present

## 2017-07-05 DIAGNOSIS — E1129 Type 2 diabetes mellitus with other diabetic kidney complication: Secondary | ICD-10-CM | POA: Diagnosis not present

## 2017-07-05 DIAGNOSIS — I509 Heart failure, unspecified: Secondary | ICD-10-CM | POA: Diagnosis not present

## 2017-07-05 DIAGNOSIS — H540X55 Blindness right eye category 5, blindness left eye category 5: Secondary | ICD-10-CM | POA: Diagnosis not present

## 2017-07-05 DIAGNOSIS — R0609 Other forms of dyspnea: Secondary | ICD-10-CM | POA: Diagnosis not present

## 2017-07-05 DIAGNOSIS — N186 End stage renal disease: Secondary | ICD-10-CM | POA: Diagnosis not present

## 2017-07-05 DIAGNOSIS — Z6830 Body mass index (BMI) 30.0-30.9, adult: Secondary | ICD-10-CM | POA: Diagnosis not present

## 2017-07-06 ENCOUNTER — Inpatient Hospital Stay (HOSPITAL_COMMUNITY)
Admission: EM | Admit: 2017-07-06 | Discharge: 2017-07-13 | DRG: 380 | Disposition: A | Discharge status: Home Health | Payer: Medicare Other | Attending: Internal Medicine | Admitting: Internal Medicine

## 2017-07-06 ENCOUNTER — Other Ambulatory Visit: Payer: Self-pay

## 2017-07-06 ENCOUNTER — Encounter (HOSPITAL_COMMUNITY): Payer: Self-pay | Admitting: Emergency Medicine

## 2017-07-06 ENCOUNTER — Emergency Department (HOSPITAL_COMMUNITY): Payer: Medicare Other

## 2017-07-06 DIAGNOSIS — G43A0 Cyclical vomiting, not intractable: Secondary | ICD-10-CM | POA: Diagnosis present

## 2017-07-06 DIAGNOSIS — Z9115 Patient's noncompliance with renal dialysis: Secondary | ICD-10-CM | POA: Diagnosis not present

## 2017-07-06 DIAGNOSIS — D649 Anemia, unspecified: Secondary | ICD-10-CM | POA: Diagnosis not present

## 2017-07-06 DIAGNOSIS — K317 Polyp of stomach and duodenum: Secondary | ICD-10-CM | POA: Diagnosis not present

## 2017-07-06 DIAGNOSIS — Z794 Long term (current) use of insulin: Secondary | ICD-10-CM | POA: Diagnosis not present

## 2017-07-06 DIAGNOSIS — R1013 Epigastric pain: Secondary | ICD-10-CM

## 2017-07-06 DIAGNOSIS — E1122 Type 2 diabetes mellitus with diabetic chronic kidney disease: Secondary | ICD-10-CM | POA: Diagnosis present

## 2017-07-06 DIAGNOSIS — N2581 Secondary hyperparathyroidism of renal origin: Secondary | ICD-10-CM | POA: Diagnosis present

## 2017-07-06 DIAGNOSIS — E1140 Type 2 diabetes mellitus with diabetic neuropathy, unspecified: Secondary | ICD-10-CM | POA: Diagnosis present

## 2017-07-06 DIAGNOSIS — R1084 Generalized abdominal pain: Secondary | ICD-10-CM | POA: Diagnosis not present

## 2017-07-06 DIAGNOSIS — H409 Unspecified glaucoma: Secondary | ICD-10-CM | POA: Diagnosis present

## 2017-07-06 DIAGNOSIS — Z79899 Other long term (current) drug therapy: Secondary | ICD-10-CM

## 2017-07-06 DIAGNOSIS — E669 Obesity, unspecified: Secondary | ICD-10-CM | POA: Diagnosis present

## 2017-07-06 DIAGNOSIS — R0602 Shortness of breath: Secondary | ICD-10-CM | POA: Diagnosis not present

## 2017-07-06 DIAGNOSIS — K760 Fatty (change of) liver, not elsewhere classified: Secondary | ICD-10-CM | POA: Diagnosis present

## 2017-07-06 DIAGNOSIS — Z7951 Long term (current) use of inhaled steroids: Secondary | ICD-10-CM

## 2017-07-06 DIAGNOSIS — D62 Acute posthemorrhagic anemia: Secondary | ICD-10-CM | POA: Diagnosis not present

## 2017-07-06 DIAGNOSIS — Z7982 Long term (current) use of aspirin: Secondary | ICD-10-CM

## 2017-07-06 DIAGNOSIS — I10 Essential (primary) hypertension: Secondary | ICD-10-CM | POA: Diagnosis not present

## 2017-07-06 DIAGNOSIS — I132 Hypertensive heart and chronic kidney disease with heart failure and with stage 5 chronic kidney disease, or end stage renal disease: Secondary | ICD-10-CM | POA: Diagnosis present

## 2017-07-06 DIAGNOSIS — K219 Gastro-esophageal reflux disease without esophagitis: Secondary | ICD-10-CM | POA: Diagnosis present

## 2017-07-06 DIAGNOSIS — J9611 Chronic respiratory failure with hypoxia: Secondary | ICD-10-CM | POA: Diagnosis present

## 2017-07-06 DIAGNOSIS — Z833 Family history of diabetes mellitus: Secondary | ICD-10-CM

## 2017-07-06 DIAGNOSIS — R1115 Cyclical vomiting syndrome unrelated to migraine: Secondary | ICD-10-CM

## 2017-07-06 DIAGNOSIS — R710 Precipitous drop in hematocrit: Secondary | ICD-10-CM | POA: Diagnosis not present

## 2017-07-06 DIAGNOSIS — Z853 Personal history of malignant neoplasm of breast: Secondary | ICD-10-CM

## 2017-07-06 DIAGNOSIS — G43109 Migraine with aura, not intractable, without status migrainosus: Secondary | ICD-10-CM | POA: Diagnosis not present

## 2017-07-06 DIAGNOSIS — Z6832 Body mass index (BMI) 32.0-32.9, adult: Secondary | ICD-10-CM

## 2017-07-06 DIAGNOSIS — K2211 Ulcer of esophagus with bleeding: Secondary | ICD-10-CM | POA: Diagnosis not present

## 2017-07-06 DIAGNOSIS — I5042 Chronic combined systolic (congestive) and diastolic (congestive) heart failure: Secondary | ICD-10-CM | POA: Diagnosis not present

## 2017-07-06 DIAGNOSIS — H548 Legal blindness, as defined in USA: Secondary | ICD-10-CM | POA: Diagnosis present

## 2017-07-06 DIAGNOSIS — G9340 Encephalopathy, unspecified: Secondary | ICD-10-CM | POA: Diagnosis present

## 2017-07-06 DIAGNOSIS — R195 Other fecal abnormalities: Secondary | ICD-10-CM | POA: Diagnosis not present

## 2017-07-06 DIAGNOSIS — D631 Anemia in chronic kidney disease: Secondary | ICD-10-CM | POA: Diagnosis present

## 2017-07-06 DIAGNOSIS — T398X5A Adverse effect of other nonopioid analgesics and antipyretics, not elsewhere classified, initial encounter: Secondary | ICD-10-CM | POA: Diagnosis present

## 2017-07-06 DIAGNOSIS — E875 Hyperkalemia: Secondary | ICD-10-CM | POA: Diagnosis present

## 2017-07-06 DIAGNOSIS — Z9981 Dependence on supplemental oxygen: Secondary | ICD-10-CM | POA: Diagnosis not present

## 2017-07-06 DIAGNOSIS — Z8249 Family history of ischemic heart disease and other diseases of the circulatory system: Secondary | ICD-10-CM | POA: Diagnosis not present

## 2017-07-06 DIAGNOSIS — E8889 Other specified metabolic disorders: Secondary | ICD-10-CM | POA: Diagnosis present

## 2017-07-06 DIAGNOSIS — T39395A Adverse effect of other nonsteroidal anti-inflammatory drugs [NSAID], initial encounter: Secondary | ICD-10-CM | POA: Diagnosis present

## 2017-07-06 DIAGNOSIS — Z992 Dependence on renal dialysis: Secondary | ICD-10-CM | POA: Diagnosis not present

## 2017-07-06 DIAGNOSIS — Z90710 Acquired absence of both cervix and uterus: Secondary | ICD-10-CM

## 2017-07-06 DIAGNOSIS — K226 Gastro-esophageal laceration-hemorrhage syndrome: Secondary | ICD-10-CM | POA: Diagnosis not present

## 2017-07-06 DIAGNOSIS — K921 Melena: Secondary | ICD-10-CM | POA: Diagnosis not present

## 2017-07-06 DIAGNOSIS — I272 Pulmonary hypertension, unspecified: Secondary | ICD-10-CM | POA: Diagnosis present

## 2017-07-06 DIAGNOSIS — N186 End stage renal disease: Secondary | ICD-10-CM | POA: Diagnosis not present

## 2017-07-06 DIAGNOSIS — K922 Gastrointestinal hemorrhage, unspecified: Secondary | ICD-10-CM | POA: Diagnosis not present

## 2017-07-06 DIAGNOSIS — E209 Hypoparathyroidism, unspecified: Secondary | ICD-10-CM | POA: Diagnosis present

## 2017-07-06 DIAGNOSIS — I248 Other forms of acute ischemic heart disease: Secondary | ICD-10-CM | POA: Diagnosis present

## 2017-07-06 DIAGNOSIS — I120 Hypertensive chronic kidney disease with stage 5 chronic kidney disease or end stage renal disease: Secondary | ICD-10-CM | POA: Diagnosis not present

## 2017-07-06 DIAGNOSIS — R0989 Other specified symptoms and signs involving the circulatory and respiratory systems: Secondary | ICD-10-CM | POA: Diagnosis not present

## 2017-07-06 DIAGNOSIS — E1121 Type 2 diabetes mellitus with diabetic nephropathy: Secondary | ICD-10-CM | POA: Diagnosis present

## 2017-07-06 LAB — COMPREHENSIVE METABOLIC PANEL
ALBUMIN: 2.8 g/dL — AB (ref 3.5–5.0)
ALK PHOS: 60 U/L (ref 38–126)
ALT: 18 U/L (ref 14–54)
AST: 22 U/L (ref 15–41)
Anion gap: 16 — ABNORMAL HIGH (ref 5–15)
BILIRUBIN TOTAL: 0.5 mg/dL (ref 0.3–1.2)
BUN: 97 mg/dL — AB (ref 6–20)
CO2: 21 mmol/L — ABNORMAL LOW (ref 22–32)
Calcium: 8.6 mg/dL — ABNORMAL LOW (ref 8.9–10.3)
Chloride: 99 mmol/L — ABNORMAL LOW (ref 101–111)
Creatinine, Ser: 11.56 mg/dL — ABNORMAL HIGH (ref 0.44–1.00)
GFR calc Af Amer: 4 mL/min — ABNORMAL LOW (ref >60)
GFR calc non Af Amer: 3 mL/min — ABNORMAL LOW (ref >60)
GLUCOSE: 252 mg/dL — AB (ref 65–99)
POTASSIUM: 5.3 mmol/L — AB (ref 3.5–5.1)
Sodium: 136 mmol/L (ref 135–145)
TOTAL PROTEIN: 5.5 g/dL — AB (ref 6.5–8.1)

## 2017-07-06 LAB — IRON AND TIBC
Iron: 101 ug/dL (ref 28–170)
Saturation Ratios: 38 % — ABNORMAL HIGH (ref 10.4–31.8)
TIBC: 267 ug/dL (ref 250–450)
UIBC: 166 ug/dL

## 2017-07-06 LAB — CBC
HEMATOCRIT: 15 % — AB (ref 36.0–46.0)
Hemoglobin: 5.1 g/dL — CL (ref 12.0–15.0)
MCH: 31.6 pg (ref 26.0–34.0)
MCHC: 32.7 g/dL (ref 30.0–36.0)
MCV: 96.8 fL (ref 78.0–100.0)
Platelets: 264 10*3/uL (ref 150–400)
RBC: 1.55 MIL/uL — ABNORMAL LOW (ref 3.87–5.11)
RDW: 17.1 % — AB (ref 11.5–15.5)
WBC: 10.3 10*3/uL (ref 4.0–10.5)

## 2017-07-06 LAB — TROPONIN I
TROPONIN I: 0.23 ng/mL — AB (ref <0.03)
Troponin I: 0.07 ng/mL (ref <0.03)

## 2017-07-06 LAB — RETICULOCYTES
RBC.: 1.5 MIL/uL — AB (ref 3.87–5.11)
RETIC CT PCT: 7.8 % — AB (ref 0.4–3.1)
Retic Count, Absolute: 117 10*3/uL (ref 19.0–186.0)

## 2017-07-06 LAB — POC OCCULT BLOOD, ED: FECAL OCCULT BLD: POSITIVE — AB

## 2017-07-06 LAB — PREPARE RBC (CROSSMATCH)

## 2017-07-06 LAB — BRAIN NATRIURETIC PEPTIDE: B Natriuretic Peptide: 824.3 pg/mL — ABNORMAL HIGH (ref 0.0–100.0)

## 2017-07-06 LAB — VITAMIN B12: VITAMIN B 12: 589 pg/mL (ref 180–914)

## 2017-07-06 LAB — FERRITIN: FERRITIN: 1992 ng/mL — AB (ref 11–307)

## 2017-07-06 LAB — GLUCOSE, CAPILLARY: Glucose-Capillary: 136 mg/dL — ABNORMAL HIGH (ref 65–99)

## 2017-07-06 LAB — I-STAT TROPONIN, ED: Troponin i, poc: 0.04 ng/mL (ref 0.00–0.08)

## 2017-07-06 LAB — FOLATE: Folate: 30.6 ng/mL (ref >5.9)

## 2017-07-06 MED ORDER — INSULIN ASPART 100 UNIT/ML ~~LOC~~ SOLN
0.0000 [IU] | Freq: Every day | SUBCUTANEOUS | Status: DC
  Administered 2017-07-09 – 2017-07-10 (×2): 2 [IU] via SUBCUTANEOUS

## 2017-07-06 MED ORDER — LIDOCAINE HCL (PF) 1 % IJ SOLN: 5.0000 mL | INTRAMUSCULAR | Status: DC | PRN

## 2017-07-06 MED ORDER — DIPHENHYDRAMINE HCL 25 MG PO CAPS
25.0000 mg | ORAL_CAPSULE | Freq: Once | ORAL | Status: AC
  Administered 2017-07-06: 25 mg via ORAL

## 2017-07-06 MED ORDER — PANTOPRAZOLE SODIUM 40 MG IV SOLR
40.0000 mg | Freq: Two times a day (BID) | INTRAVENOUS | Status: DC
  Administered 2017-07-06 – 2017-07-07 (×2): 40 mg via INTRAVENOUS
  Filled 2017-07-06 (×2): qty 40

## 2017-07-06 MED ORDER — SODIUM CHLORIDE 0.9% FLUSH
3.0000 mL | Freq: Two times a day (BID) | INTRAVENOUS | Status: DC
  Administered 2017-07-07 – 2017-07-13 (×12): 3 mL via INTRAVENOUS

## 2017-07-06 MED ORDER — SODIUM CHLORIDE 0.9 % IV SOLN
10.0000 mL/h | Freq: Once | INTRAVENOUS | Status: AC
  Administered 2017-07-06: 10 mL/h via INTRAVENOUS

## 2017-07-06 MED ORDER — ACETAMINOPHEN 325 MG PO TABS
650.0000 mg | ORAL_TABLET | Freq: Four times a day (QID) | ORAL | Status: DC | PRN
  Administered 2017-07-06 – 2017-07-12 (×4): 650 mg via ORAL
  Filled 2017-07-06 (×5): qty 2

## 2017-07-06 MED ORDER — DIPHENHYDRAMINE HCL 25 MG PO CAPS
ORAL_CAPSULE | ORAL | Status: AC
  Filled 2017-07-06: qty 1

## 2017-07-06 MED ORDER — DIPHENHYDRAMINE HCL 12.5 MG/5ML PO ELIX
12.5000 mg | ORAL_SOLUTION | Freq: Once | ORAL | Status: AC
  Administered 2017-07-07: 12.5 mg via ORAL
  Filled 2017-07-06: qty 10

## 2017-07-06 MED ORDER — ALBUTEROL SULFATE (2.5 MG/3ML) 0.083% IN NEBU: 2.5000 mg | INHALATION_SOLUTION | Freq: Four times a day (QID) | RESPIRATORY_TRACT | Status: DC | PRN

## 2017-07-06 MED ORDER — ACETAMINOPHEN 325 MG PO TABS
ORAL_TABLET | ORAL | Status: AC
  Filled 2017-07-06: qty 2

## 2017-07-06 MED ORDER — DEXAMETHASONE SODIUM PHOSPHATE 10 MG/ML IJ SOLN
10.0000 mg | Freq: Once | INTRAMUSCULAR | Status: AC
  Administered 2017-07-10: 10 mg via INTRAVENOUS
  Filled 2017-07-06 (×2): qty 1

## 2017-07-06 MED ORDER — LIDOCAINE-PRILOCAINE 2.5-2.5 % EX CREA: 1.0000 "application " | TOPICAL_CREAM | CUTANEOUS | Status: DC | PRN

## 2017-07-06 MED ORDER — DARBEPOETIN ALFA 200 MCG/0.4ML IJ SOSY
200.0000 ug | PREFILLED_SYRINGE | INTRAMUSCULAR | Status: DC
  Administered 2017-07-08: 200 ug via INTRAVENOUS
  Filled 2017-07-06: qty 0.4

## 2017-07-06 MED ORDER — METOCLOPRAMIDE HCL 5 MG/ML IJ SOLN
10.0000 mg | Freq: Once | INTRAMUSCULAR | Status: DC
  Filled 2017-07-06: qty 2

## 2017-07-06 MED ORDER — SODIUM CHLORIDE 0.9 % IV SOLN
Freq: Once | INTRAVENOUS | Status: AC
  Administered 2017-07-06: 10 mL/h via INTRAVENOUS

## 2017-07-06 MED ORDER — SODIUM CHLORIDE 0.9 % IV SOLN: 100.0000 mL | INTRAVENOUS | Status: DC | PRN

## 2017-07-06 MED ORDER — FLUTICASONE PROPIONATE 50 MCG/ACT NA SUSP: 1.0000 | Freq: Every day | NASAL | Status: DC | PRN

## 2017-07-06 MED ORDER — LANTHANUM CARBONATE 500 MG PO CHEW
2000.0000 mg | CHEWABLE_TABLET | Freq: Three times a day (TID) | ORAL | Status: DC
  Administered 2017-07-07 – 2017-07-13 (×15): 2000 mg via ORAL
  Filled 2017-07-06 (×16): qty 4

## 2017-07-06 MED ORDER — ACETAMINOPHEN 650 MG RE SUPP
650.0000 mg | Freq: Four times a day (QID) | RECTAL | Status: DC | PRN
  Administered 2017-07-07: 650 mg via RECTAL
  Filled 2017-07-06: qty 1

## 2017-07-06 MED ORDER — ALTEPLASE 2 MG IJ SOLR: 2.0000 mg | Freq: Once | INTRAMUSCULAR | Status: DC | PRN

## 2017-07-06 MED ORDER — DOXERCALCIFEROL 4 MCG/2ML IV SOLN
2.0000 ug | INTRAVENOUS | Status: DC
  Administered 2017-07-06 – 2017-07-10 (×3): 2 ug via INTRAVENOUS

## 2017-07-06 MED ORDER — DOXERCALCIFEROL 4 MCG/2ML IV SOLN
INTRAVENOUS | Status: AC
  Filled 2017-07-06: qty 2

## 2017-07-06 MED ORDER — PENTAFLUOROPROP-TETRAFLUOROETH EX AERO: 1.0000 "application " | INHALATION_SPRAY | CUTANEOUS | Status: DC | PRN

## 2017-07-06 MED ORDER — DOXERCALCIFEROL 4 MCG/2ML IV SOLN: 1.0000 ug | INTRAVENOUS | Status: DC

## 2017-07-06 MED ORDER — TRAMADOL HCL 50 MG PO TABS
50.0000 mg | ORAL_TABLET | Freq: Four times a day (QID) | ORAL | Status: AC | PRN
  Administered 2017-07-06 – 2017-07-07 (×2): 50 mg via ORAL
  Filled 2017-07-06 (×2): qty 1

## 2017-07-06 MED ORDER — INSULIN ASPART 100 UNIT/ML ~~LOC~~ SOLN
0.0000 [IU] | Freq: Three times a day (TID) | SUBCUTANEOUS | Status: DC
  Administered 2017-07-07: 1 [IU] via SUBCUTANEOUS
  Administered 2017-07-07: 2 [IU] via SUBCUTANEOUS
  Administered 2017-07-08 (×2): 1 [IU] via SUBCUTANEOUS
  Administered 2017-07-09: 5 [IU] via SUBCUTANEOUS
  Administered 2017-07-09 – 2017-07-10 (×3): 2 [IU] via SUBCUTANEOUS
  Administered 2017-07-10: 1 [IU] via SUBCUTANEOUS
  Administered 2017-07-11: 2 [IU] via SUBCUTANEOUS
  Administered 2017-07-11: 5 [IU] via SUBCUTANEOUS
  Administered 2017-07-11: 1 [IU] via SUBCUTANEOUS
  Administered 2017-07-12: 3 [IU] via SUBCUTANEOUS
  Administered 2017-07-13: 1 [IU] via SUBCUTANEOUS
  Administered 2017-07-13: 2 [IU] via SUBCUTANEOUS

## 2017-07-06 MED ORDER — PREDNISOLONE ACETATE 1 % OP SUSP
1.0000 [drp] | Freq: Four times a day (QID) | OPHTHALMIC | Status: DC
  Administered 2017-07-06 – 2017-07-13 (×26): 1 [drp] via OPHTHALMIC
  Filled 2017-07-06 (×3): qty 1

## 2017-07-06 MED ORDER — HEPARIN SODIUM (PORCINE) 1000 UNIT/ML DIALYSIS
1000.0000 [IU] | INTRAMUSCULAR | Status: DC | PRN
  Filled 2017-07-06: qty 1

## 2017-07-06 MED ORDER — ONDANSETRON HCL 4 MG/2ML IJ SOLN
4.0000 mg | Freq: Four times a day (QID) | INTRAMUSCULAR | Status: DC | PRN
  Administered 2017-07-07 – 2017-07-11 (×5): 4 mg via INTRAVENOUS
  Filled 2017-07-06 (×5): qty 2

## 2017-07-06 NOTE — ED Notes (Signed)
IV team at bedside at this time. 

## 2017-07-06 NOTE — Consult Note (Signed)
[facility] Renal Consultation Note    Indication for Consultation:  Management of ESRD/hemodialysis; anemia, hypertension/volume and secondary hyperparathyroidism Referring physician, Kirby Funk, MD ED  HPI: [name] is a 66 y.o. female with ESRD on MWF HD at [facility], hx of DM, HTN, breast cancer who presented to the ED with SOB and weakness.  Her last HD was 12/14.  She ran 3 hours of her prescribed 4 hours with a post wt of 85.7 and net UF of only 1.8 hours. She notes some abdominal pain Friday at dialysis which she thought was due to cramping.  She vomited after she went home Friday.   Her normal EDW is 81 but she has not attained that for some time. She missed HD last Monday which also added to her high UF goals.   Her last Hgb at dialysis was 8.8 on 12/12 which had gradually declined from 10 11/7.  Last tsat was 35% 11/28. Labs in the ED showed an acute drop in hgb to 5.1 Stool was heme +, CXR shows mild pulmonary edema. BP is lower than usual for her given this is a pre dialysis time.   She has had some N and V.  Appetite is poor though she has been eating some.  She is blind and cannot see the color of her stools or emesis.. She still makes some urine.    Past Medical History:  Diagnosis Date  . Anemia   . Anxiety   . Arthritis    "joints" (06/15/2013)  . Blood transfusion without reported diagnosis   . Breast cancer ([place])    left  . CHF (congestive heart failure) ([place])   . ESRD (end stage renal disease) ([place])    "suppose to start dialysis today" (06/15/2013)  . GERD (gastroesophageal reflux disease)   . Hypertension   . Myalgia 12/31/2011  . Neuropathy 12/31/2011  . Shortness of breath    "lately it's been all the time" (06/15/2013)  . Type II diabetes mellitus ([place])    Past Surgical History:  Procedure Laterality Date  . ABDOMINAL HYSTERECTOMY     partial  . AV FISTULA PLACEMENT Right 06/06/2013   Procedure: ARTERIOVENOUS (AV) FISTULA CREATION-RIGHT  BRACHIAL CEPHALIC;  Surgeon: Conrad Hummelstown, MD;  Location: [place];  Service: Vascular;  Laterality: Right;  . BREAST BIOPSY Left   . BREAST LUMPECTOMY Left    "and took out some lymph nodes" (06/15/2013)  . CATARACT EXTRACTION W/ ANTERIOR VITRECTOMY Bilateral   . CESAREAN SECTION  1980  . EYE SURGERY Bilateral    laser surgery  . PARS PLANA VITRECTOMY Right 05/05/2017   Procedure: PARS PLANA VITRECTOMY WITH 25 GAUGE; PARTIAL REMOVAL OF OIL; INFERIOR PERIPHERAL IRIDECTOMY, REFORM ANTERIOR CHAMBER RIGHT EYE;  Surgeon: [name], MD;  Location: [place];  Service: Ophthalmology;  Laterality: Right;  . REFRACTIVE SURGERY Bilateral   . REMOVAL OF A DIALYSIS CATHETER Right 06/06/2013   Procedure: REMOVAL OF RIGHT MEDIPORT;  Surgeon: Conrad Chilili, MD;  Location: [place];  Service: Vascular;  Laterality: Right;  . TONSILLECTOMY     Family History  Problem Relation Age of Onset  . Diabetes Mother   . Hyperlipidemia Mother   . Hypertension Mother   . Hypertension Father   . Diabetes Sister   . Diabetes Brother   . Hypertension Brother   . Heart attack Brother   . Kidney disease Brother   . Colon cancer Neg Hx   . Colon polyps Neg Hx   .  Esophageal cancer Neg Hx   . Gallbladder disease Neg Hx    Social History:  reports that  has never smoked. she has never used smokeless tobacco. She reports that she does not drink alcohol or use drugs. Allergies  Allergen Reactions  . Oxycodone Other (See Comments)    Hallucinations   . Latex Hives  . Vicodin [Hydrocodone-Acetaminophen] Other (See Comments)    hallucinations   Prior to Admission medications   Medication Sig Start Date End Date Taking? Authorizing Provider  albuterol (PROVENTIL) (2.5 MG/3ML) 0.083% nebulizer solution Take 3 mLs (2.5 mg total) by nebulization every 6 (six) hours as needed for wheezing or shortness of breath. 10/30/16   Theodis Blaze, MD  amLODipine (NORVASC) 10 MG tablet Take 10 mg at bedtime by mouth. 06/02/17    [provider]  aspirin 81 MG chewable tablet Chew 81 mg by mouth daily with lunch.     [provider]  B Complex-C-Folic Acid (RENA-VITE RX) 1 MG TABS Take 1 tablet by mouth daily with lunch.  09/29/16   [provider]  benzonatate (TESSALON PERLES) 100 MG capsule Take 1 capsule (100 mg total) by mouth 3 (three) times daily as needed for cough. Patient not taking: Reported on 05/05/2017 08/28/16   Mackuen, Courteney Lyn, MD  carvedilol (COREG) 12.5 MG tablet Take 12.5 mg by mouth 2 (two) times daily with a meal.    [provider]  cloNIDine (CATAPRES) 0.1 MG tablet Take 0.1 mg 2 (two) times daily by mouth. with lunch and at bedtime    [provider]  darbepoetin (ARANESP) 200 MCG/0.4ML SOLN injection Inject 0.4 mLs (200 mcg total) into the vein every Wednesday with hemodialysis. 06/22/13   Geradine Girt, DO  docusate sodium (COLACE) 100 MG capsule Take 100 mg by mouth daily.    [provider]  doxercalciferol (HECTOROL) 4 MCG/2ML injection Inject 0.5 mLs (1 mcg total) into the vein every Monday, Wednesday, and Friday with hemodialysis. 06/18/13   Geradine Girt, DO  fluticasone (FLONASE) 50 MCG/ACT nasal spray Place 1-2 sprays into both nostrils daily as needed for allergies or rhinitis.     [provider]  insulin aspart (NOVOLOG FLEXPEN) 100 UNIT/ML FlexPen Inject 10-16 Units into the skin See admin instructions. Sliding scale     [provider]  ketorolac (TORADOL) 10 MG tablet Take 1 tablet (10 mg total) every 8 (eight) hours by mouth. 06/06/17   Hall, Lorenda Cahill, DO  lanthanum (FOSRENOL) 1000 MG chewable tablet Chew 2,000 mg by mouth 3 (three) times daily with meals.     [provider]  lisinopril (PRINIVIL,ZESTRIL) 40 MG tablet Take 40 mg by mouth at bedtime.     [provider]  omeprazole (PRILOSEC OTC) 20 MG tablet Take 20 mg by mouth 2 (two) times daily as needed (reflux).     [provider]  prednisoLONE acetate (PRED FORTE) 1 % ophthalmic suspension Place 1 drop into the right eye 4 (four) times daily. 04/27/17   [provider]  sennosides (SENOKOT) 8.8 MG/5ML syrup Take 5 mLs by mouth 2 (two) times daily. Patient not taking: Reported on 05/05/2017 10/30/16   Theodis Blaze, MD   No current facility-administered medications for this encounter.    Current Outpatient Medications  Medication Sig Dispense Refill  . albuterol (PROVENTIL) (2.5 MG/3ML) 0.083% nebulizer solution Take 3 mLs (2.5 mg total) by nebulization every 6 (six) hours as needed for wheezing or shortness of  breath. 75 mL 12  . amLODipine (NORVASC) 10 MG tablet Take 10 mg at bedtime by mouth.  0  . aspirin 81 MG chewable tablet Chew 81 mg by mouth daily with lunch.     . B Complex-C-Folic Acid (RENA-VITE RX) 1 MG TABS Take 1 tablet by mouth daily with lunch.   1  . benzonatate (TESSALON PERLES) 100 MG capsule Take 1 capsule (100 mg total) by mouth 3 (three) times daily as needed for cough. (Patient not taking: Reported on 05/05/2017) 20 capsule 0  . carvedilol (COREG) 12.5 MG tablet Take 12.5 mg by mouth 2 (two) times daily with a meal.    . cloNIDine (CATAPRES) 0.1 MG tablet Take 0.1 mg 2 (two) times daily by mouth. with lunch and at bedtime    . darbepoetin (ARANESP) 200 MCG/0.4ML SOLN injection Inject 0.4 mLs (200 mcg total) into the vein every Wednesday with hemodialysis. 1.68 mL   . docusate sodium (COLACE) 100 MG capsule Take 100 mg by mouth daily.    Marland Kitchen doxercalciferol (HECTOROL) 4 MCG/2ML injection Inject 0.5 mLs (1 mcg total) into the vein every Monday, Wednesday, and Friday with hemodialysis. 2 mL   . fluticasone (FLONASE) 50 MCG/ACT nasal spray Place 1-2 sprays into both nostrils daily as needed for allergies or rhinitis.     Marland Kitchen insulin aspart (NOVOLOG FLEXPEN) 100 UNIT/ML FlexPen Inject 10-16 Units into the skin See admin instructions. Sliding scale     . ketorolac (TORADOL) 10 MG  tablet Take 1 tablet (10 mg total) every 8 (eight) hours by mouth. 20 tablet 0  . lanthanum (FOSRENOL) 1000 MG chewable tablet Chew 2,000 mg by mouth 3 (three) times daily with meals.     Marland Kitchen lisinopril (PRINIVIL,ZESTRIL) 40 MG tablet Take 40 mg by mouth at bedtime.     Marland Kitchen omeprazole (PRILOSEC OTC) 20 MG tablet Take 20 mg by mouth 2 (two) times daily as needed (reflux).     . prednisoLONE acetate (PRED FORTE) 1 % ophthalmic suspension Place 1 drop into the right eye 4 (four) times daily.  0  . sennosides (SENOKOT) 8.8 MG/5ML syrup Take 5 mLs by mouth 2 (two) times daily. (Patient not taking: Reported on 05/05/2017) 240 mL 0   Labs: Basic Metabolic Panel: Recent Labs  Lab 07/06/17 0731  NA 136  K 5.3*  CL 99*  CO2 21*  GLUCOSE 252*  BUN 97*  CREATININE 11.56*  CALCIUM 8.6*   Liver Function Tests: Recent Labs  Lab 07/06/17 0731  AST 22  ALT 18  ALKPHOS 60  BILITOT 0.5  PROT 5.5*  ALBUMIN 2.8*   No results for input(s): LIPASE, AMYLASE in the last 168 hours. No results for input(s): AMMONIA in the last 168 hours. CBC: Recent Labs  Lab 07/06/17 0731  WBC 10.3  HGB 5.1*  HCT 15.0*  MCV 96.8  PLT 264   Cardiac Enzymes: Recent Labs  Lab 07/06/17 0731  TROPONINI <0.03   CBG: No results for input(s): GLUCAP in the last 168 hours. Iron Studies: No results for input(s): IRON, TIBC, TRANSFERRIN, FERRITIN in the last 72 hours. Studies/Results: Dg Chest 2 View  Result Date: 07/06/2017 CLINICAL DATA:  66 year old female with shortness of breath worsening at night. On dialysis. Initial encounter. EXAM: CHEST  2 VIEW COMPARISON:  06/03/2017 chest x-ray.  10/28/2016 chest CT. FINDINGS: Cardiomegaly. Pulmonary vascular congestion with minimal fissural thickening suggestive of mild pulmonary edema. Calcified aorta. No pneumothorax. No acute osseous abnormality. IMPRESSION: Cardiomegaly. Pulmonary vascular congestion with  minimal fissural thickening suggestive of mild pulmonary  edema. Aortic Atherosclerosis (ICD10-I70.0). Electronically Signed   By: Genia Del M.D.   On: 07/06/2017 07:36    ROS: As per HPI otherwise negative.  Physical Exam: Vitals:   07/06/17 0915 07/06/17 0930 07/06/17 1000 07/06/17 1030  BP: 128/62 (!) 141/63 (!) 133/58 140/62  Pulse: 87 90 89 91  Resp: (!) 22 19 (!) 24 (!) 21  Temp:      TempSrc:      SpO2: 96% 98% 100% 95%     General: WDWN AAF NAD supine on stretcher Head: NCAT sclera not icteric MMM patch on right eye - blind Neck: Supple.  Lungs:  occ crackles, dim BS Heart: RRR with S1 S2.  Abdomen: soft NT + BS - mild mid abdominal to epigastric tenderness Lower extremities: L 2+ R 3 + pitting edema  Neuro: A & O  X 3. Moves all extremities spontaneously. Psych:  Responds to questions appropriately with a normal affect. Dialysis Access:right upper AVF + bruit  Dialysis Orders: [place] 4 hr EDW 81 400/600 heparin 2500 hectorol 2 venofer 50 [facility] 75 last 12/12 right upper AVF  Recent labs: hgb 10 11/7 ([facility] 30 10/31                      hgb 9.8 11/14                      hgb 9.5 11/28 ([facility] 75 11/28 - tsat 35% 11/28 - on venofer 50 /week                       hgb 9    12/05                       hgb 8.8 12/12 ([facility] 75 12/12  Assessment/Plan: 1. ABLA due to GIB - hgb had been slowing trending down as an outpatient but this is an abrupt drop - plan transfuse 2units PRBC on HD today and more as needed; not due for ESA- hold on Fe due to transfusion- GI to see 2. SOB and weakness - at least in part due to low hgb - will inprove with transfusion and ultrafiltration. 3. ESRD -  MWF - HD today - evaluate for addition blood and UF on Tuesday - may need extra HD treatment Tuesday 4. Hypertension/volume  -titrate closer to EDW while here - has not been getting near EDW at outpatient unit due to high goals and signing off early; would hold meds and gradually resume as hgb increases - she is on multiple meds 5. Metabolic bone  disease -  Continue hectorol and resume 2 fosrenol ac when eating 6. Nutrition - npo for now 7. DM - per primary  Myriam Jacobson, PA-C [place] [phone] 07/06/2017, 11:28 AM

## 2017-07-06 NOTE — ED Provider Notes (Signed)
[facility]ERG[facility]Y DEPARTMENT Provider Note   CSN: [id number] Arrival date & time: 07/06/17  5631     History   Chief Complaint Chief Complaint  Patient presents with  . Shortness of Breath    HPI [name] is a 66 y.o. female.  HPI  Patient presents with concern of dyspnea. Patient has multiple medical issues including end-stage renal disease, is on dialysis. Completed a full dialysis session 3 days ago, as scheduled. She notes concerned that she may not have had enough fluid removed during that session.  On since that time she has had worsening dyspnea, no pain. She had one episode of vomiting 3 days ago, but none since. No fever, no chills. Today, due to dyspnea, generalized weakness she was unable to go to her scheduled dialysis session. She is here with her brother who assists with the HPI.  Past Medical History:  Diagnosis Date  . Anemia   . Anxiety   . Arthritis    "joints" (06/15/2013)  . Blood transfusion without reported diagnosis   . Breast cancer ([place])    left  . CHF (congestive heart failure) ([place])   . ESRD (end stage renal disease) ([facility])    "suppose to start dialysis today" (06/15/2013)  . GERD (gastroesophageal reflux disease)   . Hypertension   . Myalgia 12/31/2011  . Neuropathy 12/31/2011  . Shortness of breath    "lately it's been all the time" (06/15/2013)  . Type II diabetes mellitus [facility])     Patient Active Problem List   Diagnosis Date Noted  . ESRD on dialysis ([place]) 06/04/2017  . Fluid overload 06/04/2017  . Blindness of both eyes 06/04/2017  . Glaucoma 06/04/2017  . Pulmonary edema 06/04/2017  . Acute on chronic respiratory failure with hypoxia ([place]) 10/27/2016  . Essential hypertension 10/27/2016  . GERD (gastroesophageal reflux disease) 10/27/2016  . Poor venous access 02/08/2015  . End stage renal disease ([place]) 07/08/2013  . Anemia 06/15/2013  . Acute on chronic combined systolic and diastolic CHF  (congestive heart failure) ([place]) 06/15/2013  . Type II diabetes mellitus with renal manifestations ([place]) 06/15/2013  . Chronic kidney disease (CKD), stage IV (severe) ([place]) 06/03/2013  . Myalgia 12/31/2011  . Neuropathy 12/31/2011  . Breast cancer of upper-inner quadrant of left female breast ([place]) 06/27/2011    Past Surgical History:  Procedure Laterality Date  . ABDOMINAL HYSTERECTOMY     partial  . AV FISTULA PLACEMENT Right 06/06/2013   Procedure: ARTERIOVENOUS (AV) FISTULA CREATION-RIGHT BRACHIAL CEPHALIC;  Surgeon: Conrad Rake, MD;  Location: [facility];  Service: Vascular;  Laterality: Right;  . BREAST BIOPSY Left   . BREAST LUMPECTOMY Left    "and took out some lymph nodes" (06/15/2013)  . CATARACT EXTRACTION W/ ANTERIOR VITRECTOMY Bilateral   . CESAREAN SECTION  1980  . EYE SURGERY Bilateral    laser surgery  . PARS PLANA VITRECTOMY Right 05/05/2017   Procedure: PARS PLANA VITRECTOMY WITH 25 GAUGE; PARTIAL REMOVAL OF OIL; INFERIOR PERIPHERAL IRIDECTOMY, REFORM ANTERIOR CHAMBER RIGHT EYE;  Surgeon: [name], MD;  Location: [facility];  Service: Ophthalmology;  Laterality: Right;  . REFRACTIVE SURGERY Bilateral   . REMOVAL OF A DIALYSIS CATHETER Right 06/06/2013   Procedure: REMOVAL OF RIGHT MEDIPORT;  Surgeon: Conrad Burnside, MD;  Location: [facility];  Service: Vascular;  Laterality: Right;  . TONSILLECTOMY      OB History    No data available  Home Medications    Prior to Admission medications   Medication Sig Start Date End Date Taking? Authorizing Provider  albuterol (PROVENTIL) (2.5 MG/3ML) 0.083% nebulizer solution Take 3 mLs (2.5 mg total) by nebulization every 6 (six) hours as needed for wheezing or shortness of breath. 10/30/16   Theodis Blaze, MD  amLODipine (NORVASC) 10 MG tablet Take 10 mg at bedtime by mouth. 06/02/17   [provider]  aspirin 81 MG chewable tablet Chew 81 mg by mouth daily with lunch.     [provider]  B Complex-C-Folic  Acid (RENA-VITE RX) 1 MG TABS Take 1 tablet by mouth daily with lunch.  09/29/16   [provider]  benzonatate (TESSALON PERLES) 100 MG capsule Take 1 capsule (100 mg total) by mouth 3 (three) times daily as needed for cough. Patient not taking: Reported on 05/05/2017 08/28/16   Mackuen, Courteney Lyn, MD  carvedilol (COREG) 12.5 MG tablet Take 12.5 mg by mouth 2 (two) times daily with a meal.    [provider]  cloNIDine (CATAPRES) 0.1 MG tablet Take 0.1 mg 2 (two) times daily by mouth. with lunch and at bedtime    [provider]  darbepoetin (ARANESP) 200 MCG/0.4ML SOLN injection Inject 0.4 mLs (200 mcg total) into the vein every Wednesday with hemodialysis. 06/22/13   Geradine Girt, DO  docusate sodium (COLACE) 100 MG capsule Take 100 mg by mouth daily.    [provider]  doxercalciferol (HECTOROL) 4 MCG/2ML injection Inject 0.5 mLs (1 mcg total) into the vein every Monday, Wednesday, and Friday with hemodialysis. 06/18/13   Geradine Girt, DO  fluticasone (FLONASE) 50 MCG/ACT nasal spray Place 1-2 sprays into both nostrils daily as needed for allergies or rhinitis.     [provider]  insulin aspart (NOVOLOG FLEXPEN) 100 UNIT/ML FlexPen Inject 10-16 Units into the skin See admin instructions. Sliding scale     [provider]  ketorolac (TORADOL) 10 MG tablet Take 1 tablet (10 mg total) every 8 (eight) hours by mouth. 06/06/17   Hall, Lorenda Cahill, DO  lanthanum (FOSRENOL) 1000 MG chewable tablet Chew 2,000 mg by mouth 3 (three) times daily with meals.     [provider]  lisinopril (PRINIVIL,ZESTRIL) 40 MG tablet Take 40 mg by mouth at bedtime.     [provider]  omeprazole (PRILOSEC OTC) 20 MG tablet Take 20 mg by mouth 2 (two) times daily as needed (reflux).     [provider]  prednisoLONE acetate (PRED FORTE) 1 % ophthalmic suspension Place 1 drop into the right eye 4 (four) times daily. 04/27/17   [provider]  sennosides (SENOKOT) 8.8 MG/5ML syrup Take 5 mLs by mouth 2 (two) times daily. Patient not taking: Reported on 05/05/2017 10/30/16   Theodis Blaze, MD    Family History Family History  Problem Relation Age of Onset  . Diabetes Mother   . Hyperlipidemia Mother   . Hypertension Mother   . Hypertension Father   . Diabetes Sister   . Diabetes Brother   . Hypertension Brother   . Heart attack Brother   . Kidney disease Brother   . Colon cancer Neg Hx   . Colon polyps Neg Hx   . Esophageal cancer Neg Hx   . Gallbladder disease Neg Hx     Social History Social History   Tobacco Use  . Smoking status: Never Smoker  . Smokeless tobacco: Never Used  Substance Use Topics  .  Alcohol use: No  . Drug use: No     Allergies   Oxycodone; Latex; and Vicodin [hydrocodone-acetaminophen]   Review of Systems Review of Systems  Constitutional:       Per HPI, otherwise negative  HENT:       Per HPI, otherwise negative  Eyes:       Left eye prosthesis, right eye essentially blind  Respiratory:       Per HPI, otherwise negative  Cardiovascular:       Per HPI, otherwise negative  Gastrointestinal: Positive for nausea and vomiting.  Endocrine:       Negative aside from HPI  Genitourinary:       Neg aside from HPI   Musculoskeletal:       Per HPI, otherwise negative  Skin: Negative.   Neurological: Negative for syncope.     Physical Exam Updated Vital Signs BP (!) 141/66 (BP Location: Left Arm)   Pulse 93   Temp (!) 97.2 F (36.2 C) (Oral)   Resp 17   SpO2 100%   Physical Exam  Constitutional: She is oriented to person, place, and time. She appears well-developed and well-nourished. No distress.  HENT:  Head: Normocephalic and atraumatic.  Eyes: Conjunctivae and EOM are normal.  Cardiovascular: Normal rate and regular rhythm.  Pulmonary/Chest: She has decreased breath sounds.  Abdominal: She exhibits no distension. There is no tenderness.    Genitourinary:  Genitourinary Comments: Hemoccult positive  Musculoskeletal: She exhibits no edema.  Right upper arm fistula unremarkable  Neurological: She is alert and oriented to person, place, and time. No cranial nerve deficit.  Skin: Skin is warm and dry.  Psychiatric: She has a normal mood and affect.  Nursing note and vitals reviewed.    ED Treatments / Results  Labs (all labs ordered are listed, but only abnormal results are displayed) Labs Reviewed  CBC - Abnormal; Notable for the following components:      Result Value   RBC 1.55 (*)    Hemoglobin 5.1 (*)    HCT 15.0 (*)    RDW 17.1 (*)    All other components within normal limits  BRAIN NATRIURETIC PEPTIDE - Abnormal; Notable for the following components:   B Natriuretic Peptide 824.3 (*)    All other components within normal limits  COMPREHENSIVE METABOLIC PANEL - Abnormal; Notable for the following components:   Potassium 5.3 (*)    Chloride 99 (*)    CO2 21 (*)    Glucose, Bld 252 (*)    BUN 97 (*)    Creatinine, Ser 11.56 (*)    Calcium 8.6 (*)    Total Protein 5.5 (*)    Albumin 2.8 (*)    GFR calc non Af Amer 3 (*)    GFR calc Af Amer 4 (*)    Anion gap 16 (*)    All other components within normal limits  POC OCCULT BLOOD, ED - Abnormal; Notable for the following components:   Fecal Occult Bld POSITIVE (*)    All other components within normal limits  TROPONIN I  OCCULT BLOOD X 1 CARD TO LAB, STOOL  VITAMIN B12  FOLATE  IRON AND TIBC  FERRITIN  RETICULOCYTES  I-STAT TROPONIN, ED  PREPARE RBC (CROSSMATCH)  TYPE AND SCREEN    EKG  EKG Interpretation  Date/Time:  Monday July 06 2017 07:01:27 EST Ventricular Rate:  89 PR Interval:  148 QRS Duration: 90 QT Interval:  382 QTC Calculation: 464 R Axis:  36 Text Interpretation:  Normal sinus rhythm Left ventricular hypertrophy with repolarization abnormality Abnormal ECG Abnormal ekg Confirmed by Carmin Muskrat [phone]) on 07/06/2017  7:37:15 AM       Radiology Dg Chest 2 View  Result Date: 07/06/2017 CLINICAL DATA:  66 year old female with shortness of breath worsening at night. On dialysis. Initial encounter. EXAM: CHEST  2 VIEW COMPARISON:  06/03/2017 chest x-ray.  10/28/2016 chest CT. FINDINGS: Cardiomegaly. Pulmonary vascular congestion with minimal fissural thickening suggestive of mild pulmonary edema. Calcified aorta. No pneumothorax. No acute osseous abnormality. IMPRESSION: Cardiomegaly. Pulmonary vascular congestion with minimal fissural thickening suggestive of mild pulmonary edema. Aortic Atherosclerosis (ICD10-I70.0). Electronically Signed   By: Genia Del M.D.   On: 07/06/2017 07:36    Procedures Procedures (including critical care time)  Medications Ordered in ED Medications  0.9 %  sodium chloride infusion (not administered)  doxercalciferol (HECTOROL) injection 2 mcg (not administered)  0.9 %  sodium chloride infusion (not administered)  diphenhydrAMINE (BENADRYL) capsule 25 mg (not administered)    Followed by  dexamethasone (DECADRON) injection 10 mg (not administered)  metoCLOPramide (REGLAN) injection 10 mg (not administered)  albuterol (PROVENTIL) (2.5 MG/3ML) 0.083% nebulizer solution 2.5 mg (not administered)  darbepoetin (ARANESP) injection 200 mcg (not administered)  doxercalciferol (HECTOROL) injection 1 mcg (not administered)  fluticasone (FLONASE) 50 MCG/ACT nasal spray 1-2 spray (not administered)  lanthanum (FOSRENOL) chewable tablet 2,000 mg (not administered)  sodium chloride flush (NS) 0.9 % injection 3 mL (not administered)  acetaminophen (TYLENOL) tablet 650 mg (not administered)    Or  acetaminophen (TYLENOL) suppository 650 mg (not administered)  pantoprazole (PROTONIX) injection 40 mg (not administered)     Initial Impression / Assessment and Plan / ED Course  I have reviewed the triage vital signs and the nursing notes.  Pertinent labs & imaging results that were  available during my care of the patient were reviewed by me and considered in my medical decision making (see chart for details).  Repeat exam the patient is in similar condition, tachycardic, mildly tachypneic. Initial labs notable for hemoglobin of 5.1, less than her potassium value of 5.3.  I discussed patient's case with our nephrology team who will follows a consulting service given the patient's need for dialysis, new anemia, and concern for fluid balance.  Update:, Patient and brother aware of all findings including concern for possible GI occult bleed. Discussed patient's case with our GI colleagues, who follows a consulting team as well.  On repeat exam the patient is awake and alert, mildly tachypneic.  Patient is aware of need for transfusion, has had a type and screen, and is awaiting her blood products.  This 65 year old female with multiple medical issues including end-stage renal disease presents with weakness, dyspnea, is found to have substantial anemia, as well as pulmonary congestion. Patient is awake and alert, but given her multiple abnormalities, concern for intravascular fluid balance, I discussed the case with our nephrology colleague, as well as GI colleagues, patient was prepped for transfusion, and admitted for further evaluation and management.  Final Clinical Impressions(s) / ED Diagnoses  Occult gastrointestinal bleed Symptomatic anemia Pulmonary congestion   CRITICAL CARE Performed by: Carmin Muskrat Total critical care time: 45 minutes Critical care time was exclusive of separately billable procedures and treating other patients. Critical care was necessary to treat or prevent imminent or life-threatening deterioration. Critical care was time spent personally by me on the following activities: development of treatment plan with patient and/or surrogate as well as nursing,  discussions with consultants, evaluation of patient's response to treatment,  examination of patient, obtaining history from patient or surrogate, ordering and performing treatments and interventions, ordering and review of laboratory studies, ordering and review of radiographic studies, pulse oximetry and re-evaluation of patient's condition.    Carmin Muskrat, MD 07/06/17 [phone]

## 2017-07-06 NOTE — Progress Notes (Signed)
RN paged critical troponin of .23 up from 0.07. Pt is having no active CP. Has reasons for demand ischemia including Hgb of 5ish and creat over 11. Will continue to cycle trops.  KJKG, NP [facility]

## 2017-07-06 NOTE — Progress Notes (Signed)
Text paged MD. Notified that Patient C/O neck and abdominal pain without relief from PO tylenol. Pt requesting K Pad and benadryl for sleep.

## 2017-07-06 NOTE — Progress Notes (Signed)
CRITICAL VALUE STICKER  CRITICAL VALUE: Troponin 0.23  RECEIVER (on-site recipient of call):  DATE & TIME NOTIFIED: Not called MESSENGER (representative from lab):  MD NOTIFIED: 07/06/17   TIME OF NOTIFICATION: 22:58-text paged  RESPONSE:

## 2017-07-06 NOTE — Progress Notes (Signed)
No response to critical troponin notification. Paged again to notify MD of Troponin 0.23

## 2017-07-06 NOTE — Progress Notes (Signed)
MD notified that Pt continues to C/O neck and abdominal pain. Also requests benadryl for sleep. New orders received.

## 2017-07-06 NOTE — ED Notes (Signed)
Attempted to call report to [facility]. Informed that patient will not be going to that room. Unable to call report.

## 2017-07-06 NOTE — Consult Note (Signed)
[facility] Consult: 1:55 PM 07/06/2017  LOS: 0 days    Referring Provider: Dr Vanita Panda in ED  Primary Care Physician:  Prince Solian, MD Primary Gastroenterologist:  Dr. Carlean Purl.     Reason for Consultation:  Anemia.     HPI: [name] is a 66 y.o. female.  Hx Diabetic nephropathy, ESRD on HD MWF.  Htn.  Breast cancer.  Deg spinal dz.  Blind.  CHF, EF 45 to 50% on ech in 10/2016. Poor venous access.  Anemia on Mircera q 2 weeks, got Venofer on 11/28 .    Hx abdominal pain since 2015.   10/2014 EGD for epigastric pain.  Benign fundic gland polyps, not responsible for pain.  Plan: mesenteric CT angio 01/2015 CT abdomen, pelvis: indistinct 4 x 2.4 cm hypodense lesion right liver lobe.  Small amount ascites in abdomenl  Mild mesenteric edema.  Sacral and upper pelvic edema.   MRI abdomen 02/2015:  Stable right lobe liver lesion.  Hepatomegaly.  Elevated right heart pressures and atherosclerosis.   1. Indeterminate 3.5 cm segment 6 right liver lobe mass, with no non-contrast MRI characteristics to suggest a specific diagnosis. No macroscopic evidence of cirrhosis. A liver metastasis or less likely a primary liver malignancy cannot be excluded, especially given the irregular shape of the lesion, heterogeneous T2 signal intensity and areas of restricted diffusion within the liver mass. If clinically feasible, a liver mass protocol CT of the abdomen with and without intravenous iodinated contrast may be useful to exclude the possibility of a benign cavernous hemangioma, which remains on the differential. Otherwise, short-term CT or MRI imaging follow-up in 3 months or percutaneous image-guided biopsy should be considered, as clinically warranted. 2. No abdominal lymphadenopathy. 3. Parenchymal iron  deposition throughout the liver and spleen, likely due to transfusional iron overload. 4. Cardiomegaly. Trace perihepatic ascites. Small right and trace left layering pleural effusions. 03/2015 CT abd pelvis with contrast:   08/2016 CT ab/pelvis w/o contrast: fatty liver, small abdominal ascites.  Aortic atherosclerosis.  CM.  Indeterminate 2.9 cm liver lesion right lobe, stable c/w 03/2015.  Small peri-hepatic FF.   Plan was for repeat surveillance CT scans to follow liver lesion.    Presented to ED today with weakness. Missed HD on 12/10 (due to snow) Last HD 12/14 and completed 3 of planned 4 hour session, came off early.  C/o of epigastric pain and cramping during HD, this subsided after HD session ended, did not recurr over weekend.  Pain different from pain she has had in past that led to all the imaging and EGD.  Vomited once on Friday 12/14 after returning home. No vomiting since.  Med list states Omeprazole 20 mg BID prn, not sure if she uses this.      hgb today is 5.1, was 8.8 on 12/12.  MCV 96.  FOBT +.  Anorexia.  Unable to see stools due to blindness.  Hyperkalemia, 5.3.  BNP elevated.   Transfusion with 2U PRBCs planned with HD session today.  Past Medical History:  Diagnosis Date  . Anemia   . Anxiety   . Arthritis    "joints" (06/15/2013)  . Blood transfusion without reported diagnosis   . Breast cancer ([place])    left  . CHF (congestive heart failure) ([place])   . ESRD (end stage renal disease) ([place])    "suppose to start dialysis today" (06/15/2013)  . GERD (gastroesophageal reflux disease)   . Hypertension   . Myalgia 12/31/2011  . Neuropathy 12/31/2011  . Shortness of breath    "lately it's been all the time" (06/15/2013)  . Type II diabetes mellitus ([place])     Past Surgical History:  Procedure Laterality Date  . ABDOMINAL HYSTERECTOMY     partial  . AV FISTULA PLACEMENT Right 06/06/2013   Procedure: ARTERIOVENOUS (AV) FISTULA CREATION-RIGHT BRACHIAL CEPHALIC;   Surgeon: Conrad Tranquillity, MD;  Location: [facility];  Service: Vascular;  Laterality: Right;  . BREAST BIOPSY Left   . BREAST LUMPECTOMY Left    "and took out some lymph nodes" (06/15/2013)  . CATARACT EXTRACTION W/ ANTERIOR VITRECTOMY Bilateral   . CESAREAN SECTION  1980  . EYE SURGERY Bilateral    laser surgery  . PARS PLANA VITRECTOMY Right 05/05/2017   Procedure: PARS PLANA VITRECTOMY WITH 25 GAUGE; PARTIAL REMOVAL OF OIL; INFERIOR PERIPHERAL IRIDECTOMY, REFORM ANTERIOR CHAMBER RIGHT EYE;  Surgeon: [name], MD;  Location: [facility];  Service: Ophthalmology;  Laterality: Right;  . REFRACTIVE SURGERY Bilateral   . REMOVAL OF A DIALYSIS CATHETER Right 06/06/2013   Procedure: REMOVAL OF RIGHT MEDIPORT;  Surgeon: Conrad Good Hope, MD;  Location: [facility];  Service: Vascular;  Laterality: Right;  . TONSILLECTOMY      Prior to Admission medications   Medication Sig Start Date End Date Taking? Authorizing Provider  albuterol (PROVENTIL) (2.5 MG/3ML) 0.083% nebulizer solution Take 3 mLs (2.5 mg total) by nebulization every 6 (six) hours as needed for wheezing or shortness of breath. 10/30/16  Yes Theodis Blaze, MD  amLODipine (NORVASC) 10 MG tablet Take 10 mg at bedtime by mouth. 06/02/17  Yes [provider]  aspirin 81 MG chewable tablet Chew 81 mg by mouth daily with lunch.    Yes [provider]  atropine 1 % ophthalmic solution Place 1 drop into the right eye 2 (two) times daily. 07/04/17  Yes [provider]  B Complex-C-Folic Acid (RENA-VITE RX) 1 MG TABS Take 1 tablet by mouth daily with lunch.  09/29/16  Yes [provider]  carvedilol (COREG) 12.5 MG tablet Take 12.5 mg by mouth 2 (two) times daily with a meal.   Yes [provider]  cloNIDine (CATAPRES) 0.1 MG tablet Take 0.1 mg 2 (two) times daily by mouth. with lunch and at bedtime   Yes [provider]  docusate sodium (COLACE) 100 MG capsule Take 100 mg by mouth daily.   Yes [provider]  fluticasone (FLONASE) 50 MCG/ACT nasal spray Place 1-2 sprays into both nostrils daily as needed for allergies or rhinitis.    Yes [provider]  insulin aspart (NOVOLOG FLEXPEN) 100 UNIT/ML FlexPen Inject 10-16 Units into the skin See admin instructions. Sliding scale    Yes [provider]  ketorolac (TORADOL) 10 MG tablet Take 1 tablet (10 mg total) every 8 (eight) hours by mouth. Patient taking differently: Take 5 mg by mouth every 8 (eight) hours.  06/06/17  Yes Hall, Carole N, DO  lanthanum (FOSRENOL) 1000 MG chewable tablet [place]  2,000 mg by mouth 3 (three) times daily with meals.    Yes [provider]  lisinopril (PRINIVIL,ZESTRIL) 40 MG tablet Take 40 mg by mouth at bedtime.    Yes [provider]  omeprazole (PRILOSEC OTC) 20 MG tablet Take 20 mg by mouth 2 (two) times daily as needed (reflux).    Yes [provider]  prednisoLONE acetate (PRED FORTE) 1 % ophthalmic suspension Place 1 drop into the right eye 4 (four) times daily. 04/27/17  Yes [provider]  benzonatate (TESSALON PERLES) 100 MG capsule Take 1 capsule (100 mg total) by mouth 3 (three) times daily as needed for cough. Patient not taking: Reported on 05/05/2017 08/28/16   Mackuen, Fredia Sorrow, MD  darbepoetin (ARANESP) 200 MCG/0.4ML SOLN injection Inject 0.4 mLs (200 mcg total) into the vein every Wednesday with hemodialysis. Patient not taking: Reported on 07/06/2017 06/22/13   Geradine Girt, DO  doxercalciferol (HECTOROL) 4 MCG/2ML injection Inject 0.5 mLs (1 mcg total) into the vein every Monday, Wednesday, and Friday with hemodialysis. Patient not taking: Reported on 07/06/2017 06/18/13   Geradine Girt, DO  sennosides (SENOKOT) 8.8 MG/5ML syrup Take 5 mLs by mouth 2 (two) times daily. Patient not taking: Reported on 05/05/2017 10/30/16   Theodis Blaze, MD    Scheduled Meds: . Derrill Memo ON 07/08/2017] darbepoetin  200 mcg Intravenous Q Wed-HD  .  diphenhydrAMINE  25 mg Oral Once   Followed by  . dexamethasone  10 mg Intravenous Once  . [START ON 07/08/2017] doxercalciferol  1 mcg Intravenous Q M,W,F-HD  . doxercalciferol  2 mcg Intravenous Q M,W,F-HD  . insulin aspart  0-5 Units Subcutaneous QHS  . insulin aspart  0-9 Units Subcutaneous TID WC  . lanthanum  2,000 mg Oral TID WC  . metoCLOPramide (REGLAN) injection  10 mg Intravenous Once  . pantoprazole (PROTONIX) IV  40 mg Intravenous Q12H  . prednisoLONE acetate  1 drop Right Eye QID  . sodium chloride flush  3 mL Intravenous Q12H   Infusions: . sodium chloride     PRN Meds: acetaminophen **OR** acetaminophen, albuterol, fluticasone   Allergies as of 07/06/2017 - Review Complete 07/06/2017  Allergen Reaction Noted  . Oxycodone Other (See Comments) 08/28/2016  . Latex Hives 11/01/2014  . Vicodin [hydrocodone-acetaminophen] Other (See Comments) 05/05/2017    Family History  Problem Relation Age of Onset  . Diabetes Mother   . Hyperlipidemia Mother   . Hypertension Mother   . Hypertension Father   . Diabetes Sister   . Diabetes Brother   . Hypertension Brother   . Heart attack Brother   . Kidney disease Brother   . Colon cancer Neg Hx   . Colon polyps Neg Hx   . Esophageal cancer Neg Hx   . Gallbladder disease Neg Hx     Social History   Social History Narrative   She reports she is single with one son and one daughter. 3 caffeinated beverages a day. She dialyzes Monday Wednesday Friday at the [facility].        REVIEW OF SYSTEMS: Constitutional:  weakness ENT:  No nose bleeds Eyes: tear during HD.   Pulm:  No SOB no cough CV:  No palpitations, no chest pain.  + LE edema .  GU:  Makes some urine.   GI:  Per HPI.  No dysphagia Heme:  Not aware of any unusual bleeding or bruising.     Transfusions:  Last transfusion found in  Epic was PRBC x 1 in 06/2013 Neuro:  No headaches, no peripheral tingling or numbness Derm:  No itching, no rash  or sores.  Endocrine:  No sweats or chills.  No polyuria or dysuria Immunization:  Current flu shot.   Travel:  None beyond local counties in last few months.    PHYSICAL EXAM: Vital signs in last 24 hours: Vitals:   07/06/17 1215 07/06/17 1315  BP: (!) 146/50 136/62  Pulse: 92 92  Resp: 17 19  Temp:    SpO2: 99% 100%   Wt Readings from Last 3 Encounters:  06/06/17 80.7 kg (177 lb 14.6 oz)  05/05/17 87.5 kg (193 lb)  10/29/16 88.2 kg (194 lb 6.4 oz)    General: pleasant obese AAF.  Comfortable.  Looks chronically ill Head:  No asymmetry or swelling   Eyes:  Prosthetic left eye.  Slight conj pallor.   Ears:  Not HOH  Nose:  No congestion or discharge Mouth:  Moist and clear oral MM>   Neck:  No mass or JVD Lungs:  Scattered crackles.  No dyspnea or cough.  Heart: RRR.  s1 s2 present.  No mrg.   Abdomen:  Obese, soft, NT.  Active BS.   Rectal: deferrred.   FOBT + per Dr Vanita Panda  Musc/Skeltl: no joint swelling, redness or signif deformity Extremities:  AV fistula in right antecubital space.    Neurologic:  Alert, oriented x 3.  No tremor.   Skin:  No rash or sores Tattoos:  none   Psych:  Calm, pleasant, cooperative.     LAB RESULTS: Recent Labs    07/06/17 0731  WBC 10.3  HGB 5.1*  HCT 15.0*  PLT 264   BMET Lab Results  Component Value Date   NA 136 07/06/2017   NA 135 06/06/2017   NA 135 06/05/2017   K 5.3 (H) 07/06/2017   K 4.2 06/06/2017   K 4.2 06/05/2017   CL 99 (L) 07/06/2017   CL 97 (L) 06/06/2017   CL 99 (L) 06/05/2017   CO2 21 (L) 07/06/2017   CO2 29 06/06/2017   CO2 28 06/05/2017   GLUCOSE 252 (H) 07/06/2017   GLUCOSE 115 (H) 06/06/2017   GLUCOSE 113 (H) 06/05/2017   BUN 97 (H) 07/06/2017   BUN 14 06/06/2017   BUN 19 06/05/2017   CREATININE 11.56 (H) 07/06/2017   CREATININE 5.23 (H) 06/06/2017   CREATININE 6.44 (H) 06/05/2017   CALCIUM 8.6 (L) 07/06/2017   CALCIUM 8.8 (L) 06/06/2017   CALCIUM 8.6 (L) 06/05/2017   LFT Recent Labs      07/06/17 0731  PROT 5.5*  ALBUMIN 2.8*  AST 22  ALT 18  ALKPHOS 60  BILITOT 0.5   PT/INR Lab Results  Component Value Date   INR 1.03 06/17/2013   INR 1.02 04/21/2013   Hepatitis Panel No results for input(s): HEPBSAG, HCVAB, HEPAIGM, HEPBIGM in the last 72 hours. C-Diff No components found for: CDIFF Lipase     Component Value Date/Time   LIPASE 17 10/27/2016 1452    Drugs of Abuse  No results found for: LABOPIA, COCAINSCRNUR, LABBENZ, AMPHETMU, THCU, LABBARB   RADIOLOGY STUDIES: Dg Chest 2 View  Result Date: 07/06/2017 CLINICAL DATA:  66 year old female with shortness of breath worsening at night. On dialysis. Initial encounter. EXAM: CHEST  2 VIEW COMPARISON:  06/03/2017 chest x-ray.  10/28/2016 chest CT. FINDINGS: Cardiomegaly. Pulmonary vascular congestion with minimal fissural thickening suggestive of mild pulmonary edema. Calcified aorta. No pneumothorax.  No acute osseous abnormality. IMPRESSION: Cardiomegaly. Pulmonary vascular congestion with minimal fissural thickening suggestive of mild pulmonary edema. Aortic Atherosclerosis (ICD10-I70.0). Electronically Signed   By: Genia Del M.D.   On: 07/06/2017 07:36     IMPRESSION:   *  Acute on chronic anemia.  Sudden drop in last 5 days.  ? Ulcer, ? SB AVMs, ? Neoplasia?   FOBT +.  Fundic gland polyps on EGD in 2016 No previous colonoscopy.    *  Indeterminate, stable right lobe liver lesion.  Fatty liver.    *  ESRD  *  IDDM  *  Blindness.     PLAN:     *  Transfuse 2 U PRBC as ordered.    *  Dr Carlean Purl will see pt later today.     Azucena Freed  07/06/2017, 1:55 PM Pager: [phone]      [facility] Attending   I have taken an interval history, reviewed the chart and examined the patient. I agree with the Advanced Practitioner's note, impression and recommendations.    When I saw her tonight she was stopping HD due to severe cramping in abdomen. I am suspicious of ischemia causing GI tract  ulceration and bleeding but not certain. She has received blood with HD. Will reassess tomorrow and determine next steps.  [name], MD, [facility] [phone] (pager) 07/06/2017 5:56 PM

## 2017-07-06 NOTE — ED Triage Notes (Signed)
Pt BIB by daughter for c/o shob, pt is HD pt, MWF, missed last Monday but had full tx W,F.  Pt c/o shob, worsening through the night, pt and family feel she is too overloaded to go HD clinic today

## 2017-07-06 NOTE — H&P (Signed)
History and Physical    [name] CBU:[id number] DOB: 03/05/1951 DOA: 07/06/2017   PCP: Prince Solian, MD   Attending physician: [place]  Patient coming from/Resides with: Private residence  Chief Complaint: Dyspnea, lower extremity swelling  HPI: [name] is a 66 y.o. female with medical history significant for chronic kidney disease on hemodialysis MWF, hypertension, chronic combined systolic and diastolic heart failure in the context of moderate MR and severe pulmonary hypertension with moderate TR, anemia of chronic kidney disease, chronic hypoxemia on home O2, diabetes on SSI and arthritis.  She was last hospitalized in November secondary to volume overload.  She responded well to dialysis.  She also had issues with complex migraine with bandlike headache as well as right eye pain.  He is instructed to follow-up with her ophthalmologist on 11/19 as recommended.  Patient was given a short-term prescription for Toradol to use 3 times daily until follows up with ophthalmologist.  Patient presents to the ER today with dyspnea worse than baseline.  She had missed Monday's hemodialysis due to the heavy snow but attended on Wednesday and Friday and apparently completed both sessions without incident.  She developed shortness of breath as well as orthopnea overnight.  Family told triage nurse that they felt she was volume overloaded enough they were not comfortable with sending her to the dialysis clinic so she presented to the ER.  In the ER patient blood pressure has been variable between 032Z and 90 systolic.  Her chest x-ray revealed mild pulmonary edema and she did have 1-2+ bilateral pitting lower extremity edema.  Of note, her hemoglobin has dropped significantly from a baseline of around 8.6-9.7-5.1.  She was Hemoccult positive on DRE but was not grossly positive.  Patient is visually impaired and is unable to determine if she has been having dark or bloody stools.  She does  report that with the past 2 dialysis sessions during dialysis she was parenting epigastric discomfort.  Patient will be admitted with symptomatic anemia.  EDP has consulted nephrology and is in the process of consulting GI.  ED Course:  Vital Signs: BP 140/62   Pulse 91   Temp (!) 97.2 F (36.2 C) (Oral)   Resp (!) 21   SpO2 95%  CXR: Mild pulmonary edema Lab data: Sodium 136, potassium 5.3, chloride 99, CO2 21, glucose 252, BUN 97, creatinine 11.56, anion gap 16, LFTs not elevated, BNP 825, troponin <0.03, white count 10,300 differential not obtained, hemoglobin 5.1, platelets 264,000, FOB positive. Medications and treatments: 2 units PRBCs been ordered by ER and awaiting transfusion  Review of Systems:  In addition to the HPI above,  No Fever-chills, myalgias or other constitutional symptoms No Headache, changes with Vision or hearing, new weakness, tingling, numbness in any extremity, dizziness, dysarthria or word finding difficulty, gait disturbance or imbalance, tremors or seizure activity No problems swallowing food or Liquids, indigestion/reflux, choking or coughing while eating, abdominal pain with or after eating No Chest pain, Cough, palpitations No N/V, melena,hematochezia, dark tarry stools, constipation No dysuria, malodorous urine, hematuria or flank pain No new skin rashes, lesions, masses or bruises, No new joint pains, aches, swelling or redness No recent unintentional weight gain or loss No polyuria, polydypsia or polyphagia   Past Medical History:  Diagnosis Date  . Anemia   . Anxiety   . Arthritis    "joints" (06/15/2013)  . Blood transfusion without reported diagnosis   . Breast cancer ([place])    left  .  CHF (congestive heart failure) ([place])   . ESRD (end stage renal disease) ([place])    "suppose to start dialysis today" (06/15/2013)  . GERD (gastroesophageal reflux disease)   . Hypertension   . Myalgia 12/31/2011  . Neuropathy 12/31/2011  . Shortness of breath     "lately it's been all the time" (06/15/2013)  . Type II diabetes mellitus ([place])     Past Surgical History:  Procedure Laterality Date  . ABDOMINAL HYSTERECTOMY     partial  . AV FISTULA PLACEMENT Right 06/06/2013   Procedure: ARTERIOVENOUS (AV) FISTULA CREATION-RIGHT BRACHIAL CEPHALIC;  Surgeon: Conrad Edmundson, MD;  Location: [facility];  Service: Vascular;  Laterality: Right;  . BREAST BIOPSY Left   . BREAST LUMPECTOMY Left    "and took out some lymph nodes" (06/15/2013)  . CATARACT EXTRACTION W/ ANTERIOR VITRECTOMY Bilateral   . CESAREAN SECTION  1980  . EYE SURGERY Bilateral    laser surgery  . PARS PLANA VITRECTOMY Right 05/05/2017   Procedure: PARS PLANA VITRECTOMY WITH 25 GAUGE; PARTIAL REMOVAL OF OIL; INFERIOR PERIPHERAL IRIDECTOMY, REFORM ANTERIOR CHAMBER RIGHT EYE;  Surgeon: [name], MD;  Location: [facility];  Service: Ophthalmology;  Laterality: Right;  . REFRACTIVE SURGERY Bilateral   . REMOVAL OF A DIALYSIS CATHETER Right 06/06/2013   Procedure: REMOVAL OF RIGHT MEDIPORT;  Surgeon: Conrad Fairview, MD;  Location: [facility];  Service: Vascular;  Laterality: Right;  . TONSILLECTOMY      Social History   Socioeconomic History  . Marital status: Single    Spouse name: Not on file  . Number of children: 2  . Years of education: Not on file  . Highest education level: Not on file  Social Needs  . Financial resource strain: Not on file  . Food insecurity - worry: Not on file  . Food insecurity - inability: Not on file  . Transportation needs - medical: Not on file  . Transportation needs - non-medical: Not on file  Occupational History  . Occupation: Diabled  Tobacco Use  . Smoking status: Never Smoker  . Smokeless tobacco: Never Used  Substance and Sexual Activity  . Alcohol use: No  . Drug use: No  . Sexual activity: No  Other Topics Concern  . Not on file  Social History Narrative   She reports she is single with one son and one daughter. 3 caffeinated beverages a  day. She dialyzes Monday Wednesday Friday at the [facility].   11/01/2014    Mobility: Independent although does require some assistance given underlying visual Work history: Disabled   Allergies  Allergen Reactions  . Oxycodone Other (See Comments)    Hallucinations   . Latex Hives  . Vicodin [Hydrocodone-Acetaminophen] Other (See Comments)    hallucinations    Family History  Problem Relation Age of Onset  . Diabetes Mother   . Hyperlipidemia Mother   . Hypertension Mother   . Hypertension Father   . Diabetes Sister   . Diabetes Brother   . Hypertension Brother   . Heart attack Brother   . Kidney disease Brother   . Colon cancer Neg Hx   . Colon polyps Neg Hx   . Esophageal cancer Neg Hx   . Gallbladder disease Neg Hx      Prior to Admission medications   Medication Sig Start Date End Date Taking? Authorizing Provider  albuterol (PROVENTIL) (2.5 MG/3ML) 0.083% nebulizer solution Take 3 mLs (2.5 mg total) by nebulization every 6 (  six) hours as needed for wheezing or shortness of breath. 10/30/16   Theodis Blaze, MD  amLODipine (NORVASC) 10 MG tablet Take 10 mg at bedtime by mouth. 06/02/17   [provider]  aspirin 81 MG chewable tablet Chew 81 mg by mouth daily with lunch.     [provider]  B Complex-C-Folic Acid (RENA-VITE RX) 1 MG TABS Take 1 tablet by mouth daily with lunch.  09/29/16   [provider]  benzonatate (TESSALON PERLES) 100 MG capsule Take 1 capsule (100 mg total) by mouth 3 (three) times daily as needed for cough. Patient not taking: Reported on 05/05/2017 08/28/16   Mackuen, Courteney Lyn, MD  carvedilol (COREG) 12.5 MG tablet Take 12.5 mg by mouth 2 (two) times daily with a meal.    [provider]  cloNIDine (CATAPRES) 0.1 MG tablet Take 0.1 mg 2 (two) times daily by mouth. with lunch and at bedtime    [provider]  darbepoetin (ARANESP) 200 MCG/0.4ML SOLN injection Inject 0.4 mLs (200 mcg  total) into the vein every Wednesday with hemodialysis. 06/22/13   Geradine Girt, DO  docusate sodium (COLACE) 100 MG capsule Take 100 mg by mouth daily.    [provider]  doxercalciferol (HECTOROL) 4 MCG/2ML injection Inject 0.5 mLs (1 mcg total) into the vein every Monday, Wednesday, and Friday with hemodialysis. 06/18/13   Geradine Girt, DO  fluticasone (FLONASE) 50 MCG/ACT nasal spray Place 1-2 sprays into both nostrils daily as needed for allergies or rhinitis.     [provider]  insulin aspart (NOVOLOG FLEXPEN) 100 UNIT/ML FlexPen Inject 10-16 Units into the skin See admin instructions. Sliding scale     [provider]  ketorolac (TORADOL) 10 MG tablet Take 1 tablet (10 mg total) every 8 (eight) hours by mouth. 06/06/17   Hall, Lorenda Cahill, DO  lanthanum (FOSRENOL) 1000 MG chewable tablet Chew 2,000 mg by mouth 3 (three) times daily with meals.     [provider]  lisinopril (PRINIVIL,ZESTRIL) 40 MG tablet Take 40 mg by mouth at bedtime.     [provider]  omeprazole (PRILOSEC OTC) 20 MG tablet Take 20 mg by mouth 2 (two) times daily as needed (reflux).     [provider]  prednisoLONE acetate (PRED FORTE) 1 % ophthalmic suspension Place 1 drop into the right eye 4 (four) times daily. 04/27/17   [provider]  sennosides (SENOKOT) 8.8 MG/5ML syrup Take 5 mLs by mouth 2 (two) times daily. Patient not taking: Reported on 05/05/2017 10/30/16   Theodis Blaze, MD    Physical Exam: Vitals:   07/06/17 0915 07/06/17 0930 07/06/17 1000 07/06/17 1030  BP: 128/62 (!) 141/63 (!) 133/58 140/62  Pulse: 87 90 89 91  Resp: (!) 22 19 (!) 24 (!) 21  Temp:      TempSrc:      SpO2: 96% 98% 100% 95%      Constitutional: NAD, calm, comfortable Eyes: PERRL, lids and conjunctivae normal-patch over right eye ENMT: Mucous membranes are moist. Posterior pharynx clear of any exudate or lesions.age-appropriate dentition.  Neck: normal,  supple, no masses, no thyromegaly Respiratory: clear to auscultation bilaterally, no wheezing, no crackles. Normal respiratory effort. No accessory muscle use.  Cardiovascular: Regular rate and rhythm, no murmurs / rubs / gallops.  2+ bilateral lower extremity edema. 2+ pedal pulses. No carotid bruits.  Abdomen: no tenderness, no masses palpated. No hepatosplenomegaly. Bowel sounds positive.  Musculoskeletal: no clubbing /  cyanosis. No joint deformity upper and lower extremities. Good ROM, no contractures. Normal muscle tone.  Skin: no rashes, lesions, ulcers. No induration Neurologic: CN 2-12 grossly intact except for previously documented visual impairment/legal blindness. Sensation intact, DTR normal. Strength 5/5 x all 4 extremities.  Psychiatric: Normal judgment and insight. Alert and oriented x 3. Normal mood.    Labs on Admission: I have personally reviewed following labs and imaging studies  CBC: Recent Labs  Lab 07/06/17 0731  WBC 10.3  HGB 5.1*  HCT 15.0*  MCV 96.8  PLT 811   Basic Metabolic Panel: Recent Labs  Lab 07/06/17 0731  NA 136  K 5.3*  CL 99*  CO2 21*  GLUCOSE 252*  BUN 97*  CREATININE 11.56*  CALCIUM 8.6*   GFR: CrCl cannot be calculated (Unknown ideal weight.). Liver Function Tests: Recent Labs  Lab 07/06/17 0731  AST 22  ALT 18  ALKPHOS 60  BILITOT 0.5  PROT 5.5*  ALBUMIN 2.8*   No results for input(s): LIPASE, AMYLASE in the last 168 hours. No results for input(s): AMMONIA in the last 168 hours. Coagulation Profile: No results for input(s): INR, PROTIME in the last 168 hours. Cardiac Enzymes: Recent Labs  Lab 07/06/17 0731  TROPONINI <0.03   BNP (last 3 results) No results for input(s): PROBNP in the last 8760 hours. HbA1C: No results for input(s): HGBA1C in the last 72 hours. CBG: No results for input(s): GLUCAP in the last 168 hours. Lipid Profile: No results for input(s): CHOL, HDL, LDLCALC, TRIG, CHOLHDL, LDLDIRECT in the  last 72 hours. Thyroid Function Tests: No results for input(s): TSH, T4TOTAL, FREET4, T3FREE, THYROIDAB in the last 72 hours. Anemia Panel: No results for input(s): VITAMINB12, FOLATE, FERRITIN, TIBC, IRON, RETICCTPCT in the last 72 hours. Urine analysis:    Component Value Date/Time   COLORURINE YELLOW 10/27/2016 1943   APPEARANCEUR CLEAR 10/27/2016 1943   LABSPEC 1.012 10/27/2016 1943   LABSPEC 1.025 02/13/2011 1533   PHURINE 8.0 10/27/2016 1943   GLUCOSEU 150 (A) 10/27/2016 1943   HGBUR NEGATIVE 10/27/2016 1943   BILIRUBINUR NEGATIVE 10/27/2016 1943   BILIRUBINUR Negative 02/13/2011 1533   KETONESUR NEGATIVE 10/27/2016 1943   PROTEINUR 100 (A) 10/27/2016 1943   UROBILINOGEN 0.2 04/28/2011 1045   NITRITE NEGATIVE 10/27/2016 1943   LEUKOCYTESUR NEGATIVE 10/27/2016 1943   LEUKOCYTESUR Negative 02/13/2011 1533   Sepsis Labs: @LABRCNTIP (procalcitonin:4,lacticidven:4) )No results found for this or any previous visit (from the past 240 hour(s)).   Radiological Exams on Admission: Dg Chest 2 View  Result Date: 07/06/2017 CLINICAL DATA:  66 year old female with shortness of breath worsening at night. On dialysis. Initial encounter. EXAM: CHEST  2 VIEW COMPARISON:  06/03/2017 chest x-ray.  10/28/2016 chest CT. FINDINGS: Cardiomegaly. Pulmonary vascular congestion with minimal fissural thickening suggestive of mild pulmonary edema. Calcified aorta. No pneumothorax. No acute osseous abnormality. IMPRESSION: Cardiomegaly. Pulmonary vascular congestion with minimal fissural thickening suggestive of mild pulmonary edema. Aortic Atherosclerosis (ICD10-I70.0). Electronically Signed   By: Genia Del M.D.   On: 07/06/2017 07:36    EKG: (Independently reviewed) sinus rhythm with ventricular rate 89 beats per minutes, QTC 464,000, nonspecific ST segment depression which is stable in the inferior lateral leads,  Assessment/Plan Principal Problem:   Symptomatic anemia/  Positive occult stool  blood test -Presents with dyspnea and orthopnea in the context of symptomatic anemia noting hemoglobin has dropped significantly since previous check in November -Baseline hemoglobin: Between 8.6 and 9.7 -Current hemoglobin 5.1 with hematocrit of 15 -Platelets  are normal -Suspect upper GI bleeding etiology given reports of epigastric pain over the past several days and recent utilization of NSAIDs/Toradol-patient was on PPI prior to admission -Protonix 40 mg IV every 12 hours -Agree with transfusion and follow-up CBC posttransfusion -Anemia panel -BUN also elevated which is highly suspicious for upper GI bleeding etiology -Formal GI consultation pending -Clear liquids for now -Hold preadmission low-dose aspirin  Active Problems:   ESRD (end stage renal disease)  -Nephrology plans dialysis today especially in the context of requirement of transfusion of 2 units packed red blood cells (possibly may require more) -MWF -Management of metabolic bone disease per nephrology -Aranesp reordered    Chest pain -Patient told attending physician she was also having chest pain so will cycle troponin as precaution -Unable to anticoagulate secondary to GI bleeding symptoms -Blood pressure suboptimal at times so unable to utilize nitrates at this juncture -EKG and troponin reassuring -Had echo in April so unless troponin becomes positive for the patient otherwise demonstrates symptoms of acute coronary syndrome no indication to repeat echo at this juncture    Type II diabetes mellitus  -SSI -Current glucose controlled -HgbA1c was 1.3 in November    Chronic combined systolic and diastolic CHF (congestive heart failure) /chronic hypoxemic respiratory failure -Management primarily through hemodialysis -Echocardiogram April 2018: EF 60-73%, grade 2 diastolic dysfunction with associated moderate concentric hypertrophy, mild left ear with moderate MR, severe pulmonary hypertension 63 mmHg with moderate  tricuspid regurgitation -Continue home dose nasal cannula oxygen -Does have lower extremity edema in the context of profound symptomatic anemia    Complicated migraine -Has associated right eye pain and requested migraine cocktail prior to dialysis given dialysis seems to exacerbate eye pain and headache symptoms -GI bleeding symptoms unable to use Toradol but instead will give Decadron, Benadryl and Reglan IV x1 dose each -Did not ask patient if followed up with ophthalmologist after discharge-please clarify given ongoing eye pain and utilization of right eye patch -Continue preadmission prednisolone eyedrops    Hypertension -Blood pressure somewhat suboptimal at times pre-hemodialysis in the context of symptomatic anemia -Holding preadmission Norvasc, carvedilol, Catapres and lisinopril-resume when blood pressure becomes more stable/elevated      DVT prophylaxis: SCDs Code Status: Full Family Communication: Family at bedside Disposition Plan: Home Consults called: Nephrology/[facility]; GI/EDP reports awaiting [facility] return call    [name] L. ANP-BC [facility] Pager [phone]   If 7PM-7AM, please contact night-coverage [email or web address] Password [email or web address]  07/06/2017, 12:08 PM

## 2017-07-06 NOTE — ED Notes (Signed)
Informed Dialysis that blood bank has called with blood ready for transfusion. Patient to be transferred to Dialysis.

## 2017-07-06 NOTE — Progress Notes (Signed)
MD acknowledged critical troponin. No new orders at this time.

## 2017-07-07 ENCOUNTER — Other Ambulatory Visit: Payer: Self-pay

## 2017-07-07 DIAGNOSIS — R1115 Cyclical vomiting syndrome unrelated to migraine: Secondary | ICD-10-CM

## 2017-07-07 DIAGNOSIS — R1013 Epigastric pain: Secondary | ICD-10-CM

## 2017-07-07 LAB — CBC
HCT: 18.1 % — ABNORMAL LOW (ref 36.0–46.0)
HCT: 19.2 % — ABNORMAL LOW (ref 36.0–46.0)
HCT: 20.3 % — ABNORMAL LOW (ref 36.0–46.0)
Hemoglobin: 6.3 g/dL — CL (ref 12.0–15.0)
Hemoglobin: 6.6 g/dL — CL (ref 12.0–15.0)
Hemoglobin: 7 g/dL — ABNORMAL LOW (ref 12.0–15.0)
MCH: 31.3 pg (ref 26.0–34.0)
MCH: 31.7 pg (ref 26.0–34.0)
MCH: 31.7 pg (ref 26.0–34.0)
MCHC: 34.4 g/dL (ref 30.0–36.0)
MCHC: 34.5 g/dL (ref 30.0–36.0)
MCHC: 34.8 g/dL (ref 30.0–36.0)
MCV: 90.6 fL (ref 78.0–100.0)
MCV: 91 fL (ref 78.0–100.0)
MCV: 92.3 fL (ref 78.0–100.0)
Platelets: 201 10*3/uL (ref 150–400)
Platelets: 215 10*3/uL (ref 150–400)
Platelets: 237 10*3/uL (ref 150–400)
RBC: 1.99 MIL/uL — ABNORMAL LOW (ref 3.87–5.11)
RBC: 2.08 MIL/uL — ABNORMAL LOW (ref 3.87–5.11)
RBC: 2.24 MIL/uL — ABNORMAL LOW (ref 3.87–5.11)
RDW: 18.4 % — ABNORMAL HIGH (ref 11.5–15.5)
RDW: 18.8 % — ABNORMAL HIGH (ref 11.5–15.5)
RDW: 20 % — ABNORMAL HIGH (ref 11.5–15.5)
WBC: 10.4 10*3/uL (ref 4.0–10.5)
WBC: 11.1 10*3/uL — ABNORMAL HIGH (ref 4.0–10.5)
WBC: 11.3 10*3/uL — ABNORMAL HIGH (ref 4.0–10.5)

## 2017-07-07 LAB — COMPREHENSIVE METABOLIC PANEL
ALT: 16 U/L (ref 14–54)
AST: 21 U/L (ref 15–41)
Albumin: 2.8 g/dL — ABNORMAL LOW (ref 3.5–5.0)
Alkaline Phosphatase: 58 U/L (ref 38–126)
Anion gap: 11 (ref 5–15)
BILIRUBIN TOTAL: 0.7 mg/dL (ref 0.3–1.2)
BUN: 74 mg/dL — AB (ref 6–20)
CALCIUM: 7.9 mg/dL — AB (ref 8.9–10.3)
CO2: 24 mmol/L (ref 22–32)
CREATININE: 8.67 mg/dL — AB (ref 0.44–1.00)
Chloride: 100 mmol/L — ABNORMAL LOW (ref 101–111)
GFR calc Af Amer: 5 mL/min — ABNORMAL LOW (ref >60)
GFR, EST NON AFRICAN AMERICAN: 4 mL/min — AB (ref >60)
Glucose, Bld: 149 mg/dL — ABNORMAL HIGH (ref 65–99)
POTASSIUM: 5.2 mmol/L — AB (ref 3.5–5.1)
Sodium: 135 mmol/L (ref 135–145)
TOTAL PROTEIN: 5.5 g/dL — AB (ref 6.5–8.1)

## 2017-07-07 LAB — GLUCOSE, CAPILLARY
Glucose-Capillary: 133 mg/dL — ABNORMAL HIGH (ref 65–99)
Glucose-Capillary: 163 mg/dL — ABNORMAL HIGH (ref 65–99)
Glucose-Capillary: 139 mg/dL — ABNORMAL HIGH (ref 65–99)

## 2017-07-07 LAB — PREPARE RBC (CROSSMATCH)

## 2017-07-07 LAB — TROPONIN I: TROPONIN I: 0.21 ng/mL — AB (ref <0.03)

## 2017-07-07 LAB — MRSA PCR SCREENING: MRSA by PCR: NEGATIVE

## 2017-07-07 MED ORDER — RENA-VITE PO TABS
1.0000 | ORAL_TABLET | Freq: Every day | ORAL | Status: DC
  Administered 2017-07-08 – 2017-07-12 (×6): 1 via ORAL
  Filled 2017-07-07 (×6): qty 1

## 2017-07-07 MED ORDER — LIDOCAINE-PRILOCAINE 2.5-2.5 % EX CREA: 1.0000 "application " | TOPICAL_CREAM | CUTANEOUS | Status: DC | PRN

## 2017-07-07 MED ORDER — PANTOPRAZOLE SODIUM 40 MG PO TBEC: 40.0000 mg | DELAYED_RELEASE_TABLET | Freq: Every day | ORAL | Status: DC

## 2017-07-07 MED ORDER — ALTEPLASE 2 MG IJ SOLR: 2.0000 mg | Freq: Once | INTRAMUSCULAR | Status: DC | PRN

## 2017-07-07 MED ORDER — SODIUM CHLORIDE 0.9 % IV SOLN: 100.0000 mL | INTRAVENOUS | Status: DC | PRN

## 2017-07-07 MED ORDER — HEPARIN SODIUM (PORCINE) 1000 UNIT/ML DIALYSIS: 1000.0000 [IU] | INTRAMUSCULAR | Status: DC | PRN

## 2017-07-07 MED ORDER — PENTAFLUOROPROP-TETRAFLUOROETH EX AERO: 1.0000 "application " | INHALATION_SPRAY | CUTANEOUS | Status: DC | PRN

## 2017-07-07 MED ORDER — LACTULOSE 10 GM/15ML PO SOLN
20.0000 g | Freq: Once | ORAL | Status: AC
  Administered 2017-07-07: 20 g via ORAL
  Filled 2017-07-07: qty 30

## 2017-07-07 MED ORDER — PANTOPRAZOLE SODIUM 40 MG IV SOLR: 40.0000 mg | Freq: Two times a day (BID) | INTRAVENOUS | Status: DC

## 2017-07-07 MED ORDER — SODIUM CHLORIDE 0.9 % IV SOLN: Freq: Once | INTRAVENOUS | Status: DC

## 2017-07-07 MED ORDER — SODIUM CHLORIDE 0.9 % IV SOLN
80.0000 mg | Freq: Once | INTRAVENOUS | Status: AC
  Administered 2017-07-07: 80 mg via INTRAVENOUS
  Filled 2017-07-07: qty 80

## 2017-07-07 MED ORDER — ONDANSETRON HCL 4 MG/2ML IJ SOLN
4.0000 mg | Freq: Once | INTRAMUSCULAR | Status: AC
  Administered 2017-07-07: 4 mg via INTRAVENOUS

## 2017-07-07 MED ORDER — SODIUM CHLORIDE 0.9 % IV SOLN
8.0000 mg/h | INTRAVENOUS | Status: DC
  Administered 2017-07-07 – 2017-07-10 (×6): 8 mg/h via INTRAVENOUS
  Filled 2017-07-07 (×10): qty 80

## 2017-07-07 MED ORDER — SODIUM CHLORIDE 0.9 % IV SOLN
Freq: Once | INTRAVENOUS | Status: AC
  Administered 2017-07-09: 13:00:00 via INTRAVENOUS

## 2017-07-07 MED ORDER — LIDOCAINE HCL (PF) 1 % IJ SOLN: 5.0000 mL | INTRAMUSCULAR | Status: DC | PRN

## 2017-07-07 NOTE — Progress Notes (Signed)
[facility] Progress Note  Dialysis Orders: [facility] 4 hr EDW 81 400/600 heparin 2500 hectorol 2 venofer 50 mircera 75 last 12/12 right upper AVF  Recent labs: hgb 10 11/7 (Mircera 30 10/31                      hgb 9.8 11/14                      hgb 9.5 11/28 (Mircera 75 11/28 - tsat 35% 11/28 - on venofer 50 /week                       hgb 9    12/05                       hgb 8.8 12/12 (Mircera 75 12/12  Assessment/Plan: 1. ABLA due to GIB - hgb had been slowing trending down as an outpatient but this is an abrupt drop -  not due for ESA  hgb 6.6 last evening post 2 units prbc and then had an additional unit this am with hgb up to 7 tsat 38% upon admission. Might have upper endoscopy today - she isn't sure. 2. SOB and weakness - at least in part due to low hgb - will inprove with transfusion and ultrafiltration. Much better today 3. ESRD -  MWF - HD signed off earlier Monday due to cramping - it is noted that she did not have BP drop with associated cramping nor does BP drop at her outpatient unit when she cramps - K 5.2 - repeat HD to lower volume and tranfuse- extra treatment 3 hr only-schedule again for HD Monday to get on schedule - she tends to do better with low volume UF 4.  Hypertension/volume  -titrate closer to EDW while here - has not been getting near EDW at outpatient unit due to high goals and signing off early;net UF only 900 with earlier sign off due to blood transfusion- no weights yesterday - getting to about 85 post HD on a good day; holding BP meds for now 5. Metabolic bone disease -  Continue hectorol and resume 2 fosrenol ac when eating 6. Nutrition - on CL - need to resume fosrenol when diet is advanced 7. DM - per primary 8. + trop - likely demand ischemia due to ABLA 0.07 > 0.23> 0.21  Myriam Jacobson, PA-C [facility] Beeper [phone] 07/07/2017,10:55 AM  LOS: 1 day   Subjective:   C/o dialysis last evening.  Didn't like the staff.  Cramping a lot  Objective Vitals:   07/07/17 0306 07/07/17 0441 07/07/17 0454 07/07/17 0630  BP:  (!) 137/36 (!) 149/45   Pulse:  89 89 89  Resp:  18 18 18   Temp:  98.9 F (37.2 C) 98.7 F (37.1 C)   TempSrc:  Oral Oral Oral  SpO2:  100% 100%   Weight: 87.5 kg (192 lb 14.4 oz)   87.1 kg (192 lb)  Height:    5\' 4"  (1.626 m)   Physical Exam General: NAD supine on room air breathing easily Heart: RRR Lungs: no rales Abdomen: soft mid abdominal and right mid side tenderness Extremities:2+ pitting LE edema Dialysis Access: right upper AVF + bruit  Additional Objective Labs: Basic Metabolic Panel: Recent Labs  Lab 07/06/17 0731 07/07/17 0706  NA 136 135  K 5.3* 5.2*  CL 99* 100*  CO2 21* 24  GLUCOSE 252* 149*  BUN 97* 74*  CREATININE 11.56* 8.67*  CALCIUM 8.6* 7.9*   Liver Function Tests: Recent Labs  Lab 07/06/17 0731 07/07/17 0706  AST 22 21  ALT 18 16  ALKPHOS 60 58  BILITOT 0.5 0.7  PROT 5.5* 5.5*  ALBUMIN 2.8* 2.8*   No results for input(s): LIPASE, AMYLASE in the last 168 hours. CBC: Recent Labs  Lab 07/06/17 0731 07/06/17 2344 07/07/17 0706  WBC 10.3 11.3* 10.4  HGB 5.1* 6.6* 7.0*  HCT 15.0* 19.2* 20.3*  MCV 96.8 92.3 90.6  PLT 264 237 215  Cardiac Enzymes: Recent Labs  Lab 07/06/17 0731 07/06/17 1234 07/06/17 2100 07/07/17 0706  TROPONINI <0.03 0.07* 0.23* 0.21*   CBG: Recent Labs  Lab 07/06/17 2106 07/07/17 0743  GLUCAP 136* 133*   Iron Studies:  Recent Labs    07/06/17 1255  IRON 101  TIBC 267  FERRITIN 1,992*   Lab Results  Component Value Date   INR 1.03 06/17/2013   INR 1.02 04/21/2013   Studies/Results: Dg Chest 2 View  Result Date: 07/06/2017 CLINICAL DATA:  66 year old female with shortness of breath worsening at night. On dialysis. Initial encounter. EXAM: CHEST  2 VIEW COMPARISON:  06/03/2017 chest x-ray.  10/28/2016 chest CT. FINDINGS: Cardiomegaly. Pulmonary vascular congestion with minimal fissural  thickening suggestive of mild pulmonary edema. Calcified aorta. No pneumothorax. No acute osseous abnormality. IMPRESSION: Cardiomegaly. Pulmonary vascular congestion with minimal fissural thickening suggestive of mild pulmonary edema. Aortic Atherosclerosis (ICD10-I70.0). Electronically Signed   By: Genia Del M.D.   On: 07/06/2017 07:36   Medications: . sodium chloride     . [START ON 07/08/2017] Darbepoetin Alfa  200 mcg Intravenous Q Wed-HD  . dexamethasone  10 mg Intravenous Once  . doxercalciferol  2 mcg Intravenous Q M,W,F-HD  . insulin aspart  0-5 Units Subcutaneous QHS  . insulin aspart  0-9 Units Subcutaneous TID WC  . lanthanum  2,000 mg Oral TID WC  . metoCLOPramide (REGLAN) injection  10 mg Intravenous Once  . pantoprazole (PROTONIX) IV  40 mg Intravenous Q12H  . prednisoLONE acetate  1 drop Right Eye QID  . sodium chloride flush  3 mL Intravenous Q12H

## 2017-07-07 NOTE — Progress Notes (Signed)
Pt c/o headache 10/10, had HD today. Pt has tylenol for pain and unable to take PO meds due to nausea. Baltazar Najjar, NP notified.

## 2017-07-07 NOTE — Progress Notes (Signed)
CRITICAL VALUE STICKER  CRITICAL VALUE: HGB 6.6  RECEIVER (on-site recipient of call):  DATE & TIME NOTIFIED: Not Called  MESSENGER (representative from lab):  MD NOTIFIED: Baltazar Najjar  TIME OF NOTIFICATION: 07/07/17 @ 00:35  RESPONSE:

## 2017-07-07 NOTE — Progress Notes (Signed)
Daily Rounding Note  07/07/2017, 12:06 PM  LOS: 1 day   SUBJECTIVE:   Chief complaint:     Queasy, no emesis.  Tolerating clears. Feels like if she could get solid food, she will feel better.  No BMs.  Cramping abd pain is better.   OBJECTIVE:         Vital signs in last 24 hours:    Temp:  [98 F (36.7 C)-98.9 F (37.2 C)] 98.7 F (37.1 C) (12/18 0454) Pulse Rate:  [84-95] 89 (12/18 0630) Resp:  [15-20] 18 (12/18 0630) BP: (116-165)/(36-94) 149/45 (12/18 0454) SpO2:  [98 %-100 %] 100 % (12/18 0454) Weight:  [87.1 kg (192 lb)-87.5 kg (192 lb 14.4 oz)] 87.1 kg (192 lb) (12/18 0630) Last BM Date: 07/05/17 Filed Weights   07/07/17 0306 07/07/17 0630  Weight: 87.5 kg (192 lb 14.4 oz) 87.1 kg (192 lb)   General: looks chronically ill   Heart: RRR Chest: clear bil.  No dyspnea or cough Abdomen: soft, active BS, NT.    Extremities: no CCE Neuro/Psych:  Oriented x 3.  Appropriate.  Moves all limbs  Intake/Output from previous day: 12/17 0701 - 12/18 0700 In: 1656 [P.O.:718; Blood:938] Out: 6759 [Urine:275]  Intake/Output this shift: Total I/O In: 570 [P.O.:570] Out: 125 [Urine:125]  Lab Results: Recent Labs    07/06/17 0731 07/06/17 2344 07/07/17 0706  WBC 10.3 11.3* 10.4  HGB 5.1* 6.6* 7.0*  HCT 15.0* 19.2* 20.3*  PLT 264 237 215   BMET Recent Labs    07/06/17 0731 07/07/17 0706  NA 136 135  K 5.3* 5.2*  CL 99* 100*  CO2 21* 24  GLUCOSE 252* 149*  BUN 97* 74*  CREATININE 11.56* 8.67*  CALCIUM 8.6* 7.9*   LFT Recent Labs    07/06/17 0731 07/07/17 0706  PROT 5.5* 5.5*  ALBUMIN 2.8* 2.8*  AST 22 21  ALT 18 16  ALKPHOS 60 58  BILITOT 0.5 0.7   PT/INR No results for input(s): LABPROT, INR in the last 72 hours. Hepatitis Panel No results for input(s): HEPBSAG, HCVAB, HEPAIGM, HEPBIGM in the last 72 hours.  Studies/Results: Dg Chest 2 View  Result Date: 07/06/2017 CLINICAL DATA:   66 year old female with shortness of breath worsening at night. On dialysis. Initial encounter. EXAM: CHEST  2 VIEW COMPARISON:  06/03/2017 chest x-ray.  10/28/2016 chest CT. FINDINGS: Cardiomegaly. Pulmonary vascular congestion with minimal fissural thickening suggestive of mild pulmonary edema. Calcified aorta. No pneumothorax. No acute osseous abnormality. IMPRESSION: Cardiomegaly. Pulmonary vascular congestion with minimal fissural thickening suggestive of mild pulmonary edema. Aortic Atherosclerosis (ICD10-I70.0). Electronically Signed   By: Genia Del M.D.   On: 07/06/2017 07:36    ASSESMENT:   *  Acute on chronic anemia.  Sudden drop in last 5 days.  ? Ulcer, ? SB AVMs, ? Neoplasia?   FOBT +.  Fundic gland polyps on EGD in 2016 Unable to tolerate full HD session due to abd cramping, raising suspicion for intestinal ischemia.   No previous colonoscopy.  hgb 5.1 >> 7 after 3 U PRBC on 12/18.     *  Indeterminate, stable right lobe liver lesion.  Fatty liver.    *  ESRD.  Unable to tolerate full HD session due to abd cramping, raising suspicion for intestinal ischemia.    *  IDDM  *  Blindness.      PLAN   *  Advance to renal diet.  1 dose  of Lactulose.      Azucena Freed  07/07/2017, 12:06 PM Pager: [phone]    [facility] Attending   I have taken an interval history, reviewed the chart and examined the patient also.  She has subsequently vomited more.  Plan for an EGD tomorrow to try to better sort out her problems of GI bleeding with acute blood loss anemia,, vomiting, abdominal cramps.  [name], MD, [facility] [phone] (pager) 07/07/2017 6:03 PM

## 2017-07-07 NOTE — Progress Notes (Signed)
PROGRESS NOTE    CATHERN TAHIR  VPX:[id number] DOB: Jun 12, 1951 DOA: 07/06/2017 PCP: Prince Solian, MD   Specialists:     Brief Narrative:  66 year old female ESRD MWF Combined systolic diastolic heart failure-moderate MR, severe pulmonary HTN-cardiac cath 2005 smooth coronaries-2012 stress Myoview was normal Diabetes mellitus type 2 HTN Previously seen by Dr. Arelia Longest 01/2015 for right upper quadrant pain and tenderness Legally blind?  Glaucoma versus complex migraine bandlike headaches Last hospitalized November volume overload  Admitted with shortness of breath chest pain hemoglobin 5.1 down from set 9.7 Prior NSAID use Troponin 0 0.07 BUN 97 creatinine 11.5-found to have grade 1-2 lower extremity edema Transfused PRBC   Assessment & Plan:   Principal Problem:   Symptomatic anemia Active Problems:   Positive occult stool blood test   ESRD (end stage renal disease) (HCC)   Type II diabetes mellitus ([place])   Hypertension   Chronic combined systolic and diastolic CHF (congestive heart failure) (HCC)   Complicated migraine   Symptomatic anemia-history of fundic gastric polyps EGD 2016 Possibly secondary to NSAID Toradol use-gastroenterology consulted, Protonix IV every 12 hours started on admission Transfused 2 unit of packed red blood cells-aspirin held from admission GI on board and will reassess--appears concern is possible mesenteric ischemia--GI has grad diet Addedn--Actively vomited ~ 400 cc Dark red material into bag--givien Zofran x 1 Nursing informed to give--Need sNG tube if vomit again--Will need probable GI input for endoscopy if continues to vomit   ESRD MWF [place] Last dry weight noted 85.5 Nephrology on board  Combined Sys and diastolic HF HTN Volume managed by dialysis Amlodipine 10, Lisinopril 40,  coreg 12.5 bid and Clonidine 0.1 mg bid all on hold   DVT prophylaxis: SCD Code Status: FUll code presumed Family Communication:  None  Disposition Plan: inpatient   Consultants:   GI  Nephro  Procedures:   none  Antimicrobials:   none    Subjective:  Vomiting and doesn;t feel well--when I saw her earlier in the day was ok Some bad discomfort No rebound no gaurd No ict no pallor    Objective: Vitals:   07/07/17 0306 07/07/17 0441 07/07/17 0454 07/07/17 0630  BP:  (!) 137/36 (!) 149/45   Pulse:  89 89 89  Resp:  18 18 18   Temp:  98.9 F (37.2 C) 98.7 F (37.1 C)   TempSrc:  Oral Oral Oral  SpO2:  100% 100%   Weight: 87.5 kg (192 lb 14.4 oz)   87.1 kg (192 lb)  Height:    5\' 4"  (1.626 m)    Intake/Output Summary (Last 24 hours) at 07/07/2017 0858 Last data filed at 07/07/2017 0452 Gross per 24 hour  Intake 1656 ml  Output 1175 ml  Net 481 ml   Filed Weights   07/07/17 0306 07/07/17 0630  Weight: 87.5 kg (192 lb 14.4 oz) 87.1 kg (192 lb)    Examination:   Anicteric noted post op changes to eyes eomi ncat cta b abd slight tender and a little distended No LE edema movesd 4 limbs equally  Data Reviewed: I have personally reviewed following labs and imaging studies  CBC: Recent Labs  Lab 07/06/17 0731 07/06/17 2344 07/07/17 0706  WBC 10.3 11.3* 10.4  HGB 5.1* 6.6* 7.0*  HCT 15.0* 19.2* 20.3*  MCV 96.8 92.3 90.6  PLT 264 237 462   Basic Metabolic Panel: Recent Labs  Lab 07/06/17 0731 07/07/17 0706  NA 136 135  K 5.3* 5.2*  CL 99*  100*  CO2 21* 24  GLUCOSE 252* 149*  BUN 97* 74*  CREATININE 11.56* 8.67*  CALCIUM 8.6* 7.9*   GFR: Estimated Creatinine Clearance: 7 mL/min (A) (by C-G formula based on SCr of 8.67 mg/dL (H)). Liver Function Tests: Recent Labs  Lab 07/06/17 0731 07/07/17 0706  AST 22 21  ALT 18 16  ALKPHOS 60 58  BILITOT 0.5 0.7  PROT 5.5* 5.5*  ALBUMIN 2.8* 2.8*   No results for input(s): LIPASE, AMYLASE in the last 168 hours. No results for input(s): AMMONIA in the last 168 hours. Coagulation Profile: No results for input(s): INR,  PROTIME in the last 168 hours. Cardiac Enzymes: Recent Labs  Lab 07/06/17 0731 07/06/17 1234 07/06/17 2100 07/07/17 0706  TROPONINI <0.03 0.07* 0.23* 0.21*   BNP (last 3 results) No results for input(s): PROBNP in the last 8760 hours. HbA1C: No results for input(s): HGBA1C in the last 72 hours. CBG: Recent Labs  Lab 07/06/17 2106 07/07/17 0743  GLUCAP 136* 133*   Lipid Profile: No results for input(s): CHOL, HDL, LDLCALC, TRIG, CHOLHDL, LDLDIRECT in the last 72 hours. Thyroid Function Tests: No results for input(s): TSH, T4TOTAL, FREET4, T3FREE, THYROIDAB in the last 72 hours. Anemia Panel: Recent Labs    07/06/17 1156 07/06/17 1255  VITAMINB12  --  589  FOLATE 30.6  --   FERRITIN  --  1,992*  TIBC  --  267  IRON  --  101  RETICCTPCT  --  7.8*   Urine analysis:    Component Value Date/Time   COLORURINE YELLOW 10/27/2016 1943   APPEARANCEUR CLEAR 10/27/2016 1943   LABSPEC 1.012 10/27/2016 1943   LABSPEC 1.025 02/13/2011 1533   PHURINE 8.0 10/27/2016 1943   GLUCOSEU 150 (A) 10/27/2016 1943   HGBUR NEGATIVE 10/27/2016 1943   BILIRUBINUR NEGATIVE 10/27/2016 1943   BILIRUBINUR Negative 02/13/2011 1533   KETONESUR NEGATIVE 10/27/2016 1943   PROTEINUR 100 (A) 10/27/2016 1943   UROBILINOGEN 0.2 04/28/2011 1045   NITRITE NEGATIVE 10/27/2016 1943   LEUKOCYTESUR NEGATIVE 10/27/2016 1943   LEUKOCYTESUR Negative 02/13/2011 1533     Radiology Studies: Reviewed images personally in health database    Scheduled Meds: . [START ON 07/08/2017] Darbepoetin Alfa  200 mcg Intravenous Q Wed-HD  . dexamethasone  10 mg Intravenous Once  . doxercalciferol  2 mcg Intravenous Q M,W,F-HD  . insulin aspart  0-5 Units Subcutaneous QHS  . insulin aspart  0-9 Units Subcutaneous TID WC  . lanthanum  2,000 mg Oral TID WC  . metoCLOPramide (REGLAN) injection  10 mg Intravenous Once  . pantoprazole (PROTONIX) IV  40 mg Intravenous Q12H  . prednisoLONE acetate  1 drop Right Eye QID    . sodium chloride flush  3 mL Intravenous Q12H   Continuous Infusions: . sodium chloride       LOS: 1 day    Time spent: [place], MD [facility] ([phone]   If 7PM-7AM, please contact night-coverage [email or web address] Password [email or web address] 07/07/2017, 8:58 AM

## 2017-07-08 ENCOUNTER — Encounter (HOSPITAL_COMMUNITY): Payer: Self-pay

## 2017-07-08 ENCOUNTER — Encounter (HOSPITAL_COMMUNITY)
Admission: EM | Disposition: A | Discharge status: Home Health | Payer: Self-pay | Source: Home / Self Care | Attending: Family Medicine

## 2017-07-08 DIAGNOSIS — R195 Other fecal abnormalities: Secondary | ICD-10-CM

## 2017-07-08 DIAGNOSIS — G43A0 Cyclical vomiting, not intractable: Secondary | ICD-10-CM

## 2017-07-08 DIAGNOSIS — R1084 Generalized abdominal pain: Secondary | ICD-10-CM

## 2017-07-08 HISTORY — PX: ESOPHAGOGASTRODUODENOSCOPY: SHX5428

## 2017-07-08 LAB — TYPE AND SCREEN
ABO/RH(D): A POS
ANTIBODY SCREEN: NEGATIVE
UNIT DIVISION: 0
UNIT DIVISION: 0
UNIT DIVISION: 0
Unit division: 0
Unit division: 0

## 2017-07-08 LAB — BPAM RBC
BLOOD PRODUCT EXPIRATION DATE: 201901032359
Blood Product Expiration Date: 201901012359
Blood Product Expiration Date: 201901032359
Blood Product Expiration Date: 201901052359
Blood Product Expiration Date: 201901092359
ISSUE DATE / TIME: 201812181846
ISSUE DATE / TIME: 201812171507
ISSUE DATE / TIME: 201812171507
ISSUE DATE / TIME: 201812180154
ISSUE DATE / TIME: 201812181846
UNIT TYPE AND RH: 6200
Unit Type and Rh: 6200
Unit Type and Rh: 6200
Unit Type and Rh: 6200
Unit Type and Rh: 6200

## 2017-07-08 LAB — COMPREHENSIVE METABOLIC PANEL
ALBUMIN: 3.2 g/dL — AB (ref 3.5–5.0)
ALT: 19 U/L (ref 14–54)
ANION GAP: 13 (ref 5–15)
AST: 31 U/L (ref 15–41)
Alkaline Phosphatase: 66 U/L (ref 38–126)
BILIRUBIN TOTAL: 1 mg/dL (ref 0.3–1.2)
BUN: 39 mg/dL — AB (ref 6–20)
CHLORIDE: 97 mmol/L — AB (ref 101–111)
CO2: 26 mmol/L (ref 22–32)
Calcium: 8.4 mg/dL — ABNORMAL LOW (ref 8.9–10.3)
Creatinine, Ser: 5.35 mg/dL — ABNORMAL HIGH (ref 0.44–1.00)
GFR calc Af Amer: 9 mL/min — ABNORMAL LOW (ref >60)
GFR calc non Af Amer: 8 mL/min — ABNORMAL LOW (ref >60)
GLUCOSE: 163 mg/dL — AB (ref 65–99)
POTASSIUM: 3.6 mmol/L (ref 3.5–5.1)
Sodium: 136 mmol/L (ref 135–145)
TOTAL PROTEIN: 6.5 g/dL (ref 6.5–8.1)

## 2017-07-08 LAB — RENAL FUNCTION PANEL
Albumin: 2.6 g/dL — ABNORMAL LOW (ref 3.5–5.0)
Anion gap: 9 (ref 5–15)
BUN: 28 mg/dL — ABNORMAL HIGH (ref 6–20)
CO2: 27 mmol/L (ref 22–32)
Calcium: 7.8 mg/dL — ABNORMAL LOW (ref 8.9–10.3)
Chloride: 100 mmol/L — ABNORMAL LOW (ref 101–111)
Creatinine, Ser: 4.17 mg/dL — ABNORMAL HIGH (ref 0.44–1.00)
GFR calc Af Amer: 12 mL/min — ABNORMAL LOW (ref >60)
GFR calc non Af Amer: 10 mL/min — ABNORMAL LOW (ref >60)
Glucose, Bld: 145 mg/dL — ABNORMAL HIGH (ref 65–99)
Phosphorus: 3.3 mg/dL (ref 2.5–4.6)
Potassium: 4.2 mmol/L (ref 3.5–5.1)
Sodium: 136 mmol/L (ref 135–145)

## 2017-07-08 LAB — CBC WITH DIFFERENTIAL/PLATELET
BASOS ABS: 0 10*3/uL (ref 0.0–0.1)
BASOS PCT: 0 %
EOS ABS: 0.2 10*3/uL (ref 0.0–0.7)
EOS PCT: 2 %
HCT: 28.9 % — ABNORMAL LOW (ref 36.0–46.0)
Hemoglobin: 9.8 g/dL — ABNORMAL LOW (ref 12.0–15.0)
Lymphocytes Relative: 16 %
Lymphs Abs: 2.2 10*3/uL (ref 0.7–4.0)
MCH: 30 pg (ref 26.0–34.0)
MCHC: 33.9 g/dL (ref 30.0–36.0)
MCV: 88.4 fL (ref 78.0–100.0)
MONO ABS: 1.2 10*3/uL — AB (ref 0.1–1.0)
MONOS PCT: 9 %
NEUTROS ABS: 10.4 10*3/uL — AB (ref 1.7–7.7)
Neutrophils Relative %: 73 %
PLATELETS: 223 10*3/uL (ref 150–400)
RBC: 3.27 MIL/uL — ABNORMAL LOW (ref 3.87–5.11)
RDW: 17.7 % — AB (ref 11.5–15.5)
WBC: 14.1 10*3/uL — ABNORMAL HIGH (ref 4.0–10.5)

## 2017-07-08 LAB — CBC
HCT: 22.1 % — ABNORMAL LOW (ref 36.0–46.0)
HCT: 22.9 % — ABNORMAL LOW (ref 36.0–46.0)
Hemoglobin: 7.5 g/dL — ABNORMAL LOW (ref 12.0–15.0)
Hemoglobin: 7.7 g/dL — ABNORMAL LOW (ref 12.0–15.0)
MCH: 30.5 pg (ref 26.0–34.0)
MCH: 30.7 pg (ref 26.0–34.0)
MCHC: 33.9 g/dL (ref 30.0–36.0)
MCHC: 33.6 g/dL (ref 30.0–36.0)
MCV: 89.8 fL (ref 78.0–100.0)
MCV: 91.2 fL (ref 78.0–100.0)
Platelets: 170 10*3/uL (ref 150–400)
Platelets: 169 10*3/uL (ref 150–400)
RBC: 2.46 MIL/uL — ABNORMAL LOW (ref 3.87–5.11)
RBC: 2.51 MIL/uL — ABNORMAL LOW (ref 3.87–5.11)
RDW: 18.9 % — ABNORMAL HIGH (ref 11.5–15.5)
RDW: 19.6 % — ABNORMAL HIGH (ref 11.5–15.5)
WBC: 10.6 10*3/uL — ABNORMAL HIGH (ref 4.0–10.5)
WBC: 10.8 10*3/uL — ABNORMAL HIGH (ref 4.0–10.5)

## 2017-07-08 LAB — GLUCOSE, CAPILLARY
Glucose-Capillary: 138 mg/dL — ABNORMAL HIGH (ref 65–99)
Glucose-Capillary: 128 mg/dL — ABNORMAL HIGH (ref 65–99)
Glucose-Capillary: 137 mg/dL — ABNORMAL HIGH (ref 65–99)
Glucose-Capillary: 137 mg/dL — ABNORMAL HIGH (ref 65–99)
Glucose-Capillary: 159 mg/dL — ABNORMAL HIGH (ref 65–99)

## 2017-07-08 LAB — HEMOGLOBIN AND HEMATOCRIT, BLOOD
HCT: 28.5 % — ABNORMAL LOW (ref 36.0–46.0)
HEMOGLOBIN: 9.6 g/dL — AB (ref 12.0–15.0)

## 2017-07-08 SURGERY — EGD (ESOPHAGOGASTRODUODENOSCOPY)
Anesthesia: Moderate Sedation

## 2017-07-08 MED ORDER — SODIUM CHLORIDE 0.9 % IV SOLN
INTRAVENOUS | Status: AC | PRN
  Administered 2017-07-08: 500 mL via INTRAMUSCULAR

## 2017-07-08 MED ORDER — DOXERCALCIFEROL 4 MCG/2ML IV SOLN
INTRAVENOUS | Status: AC
  Administered 2017-07-08: 2 ug via INTRAVENOUS
  Filled 2017-07-08: qty 2

## 2017-07-08 MED ORDER — ALTEPLASE 2 MG IJ SOLR: 2.0000 mg | Freq: Once | INTRAMUSCULAR | Status: DC | PRN

## 2017-07-08 MED ORDER — SODIUM CHLORIDE 0.9 % IV SOLN: 100.0000 mL | INTRAVENOUS | Status: DC | PRN

## 2017-07-08 MED ORDER — ONDANSETRON HCL 4 MG/2ML IJ SOLN
INTRAMUSCULAR | Status: AC
  Administered 2017-07-08: 4 mg via INTRAVENOUS
  Filled 2017-07-08: qty 2

## 2017-07-08 MED ORDER — BUTAMBEN-TETRACAINE-BENZOCAINE 2-2-14 % EX AERO
INHALATION_SPRAY | CUTANEOUS | Status: DC | PRN
  Administered 2017-07-08: 2 via TOPICAL

## 2017-07-08 MED ORDER — PENTAFLUOROPROP-TETRAFLUOROETH EX AERO: 1.0000 "application " | INHALATION_SPRAY | CUTANEOUS | Status: DC | PRN

## 2017-07-08 MED ORDER — FENTANYL CITRATE (PF) 100 MCG/2ML IJ SOLN
INTRAMUSCULAR | Status: AC
  Filled 2017-07-08: qty 2

## 2017-07-08 MED ORDER — LIDOCAINE HCL (PF) 1 % IJ SOLN: 5.0000 mL | INTRAMUSCULAR | Status: DC | PRN

## 2017-07-08 MED ORDER — MIDAZOLAM HCL 10 MG/2ML IJ SOLN
INTRAMUSCULAR | Status: DC | PRN
  Administered 2017-07-08 (×3): 1 mg via INTRAVENOUS
  Administered 2017-07-08: 2 mg via INTRAVENOUS

## 2017-07-08 MED ORDER — LIDOCAINE-PRILOCAINE 2.5-2.5 % EX CREA: 1.0000 "application " | TOPICAL_CREAM | CUTANEOUS | Status: DC | PRN

## 2017-07-08 MED ORDER — TRAMADOL HCL 50 MG PO TABS
50.0000 mg | ORAL_TABLET | Freq: Four times a day (QID) | ORAL | Status: AC | PRN
  Administered 2017-07-08 – 2017-07-10 (×2): 50 mg via ORAL
  Filled 2017-07-08: qty 1

## 2017-07-08 MED ORDER — FENTANYL CITRATE (PF) 100 MCG/2ML IJ SOLN
INTRAMUSCULAR | Status: DC | PRN
  Administered 2017-07-08 (×2): 25 ug via INTRAVENOUS

## 2017-07-08 MED ORDER — DARBEPOETIN ALFA 200 MCG/0.4ML IJ SOSY
PREFILLED_SYRINGE | INTRAMUSCULAR | Status: AC
  Administered 2017-07-08: 200 ug via INTRAVENOUS
  Filled 2017-07-08: qty 0.4

## 2017-07-08 MED ORDER — MIDAZOLAM HCL 5 MG/ML IJ SOLN
INTRAMUSCULAR | Status: AC
  Filled 2017-07-08: qty 2

## 2017-07-08 MED ORDER — SODIUM CHLORIDE 0.9 % IV SOLN: INTRAVENOUS | Status: DC

## 2017-07-08 NOTE — Op Note (Signed)
[facility] Patient Name: [name] Procedure Date : 07/08/2017 MRN: [id number] Attending MD: [name] , MD Date of Birth: 16-Aug-1950 CSN: [id number] Age: 66 Admit Type: Inpatient Procedure:                Upper GI endoscopy Indications:              Active gastrointestinal bleeding, Recent                            gastrointestinal bleeding, Suspected upper                            gastrointestinal bleeding Providers:                [name], MD, Angus Seller, Danford Bad, Technician Referring MD:              Medicines:                Midazolam  mg IV, Fentanyl micrograms IV,                            Cetacaine spray Complications:            No immediate complications. Estimated Blood Loss:     Estimated blood loss: none. Procedure:                Pre-Anesthesia Assessment:                           - Prior to the procedure, a History and Physical                            was performed, and patient medications and                            allergies were reviewed. The patient's tolerance of                            previous anesthesia was also reviewed. The risks                            and benefits of the procedure and the sedation                            options and risks were discussed with the patient.                            All questions were answered, and informed consent                            was obtained. Prior Anticoagulants: The patient has                            taken no previous anticoagulant or antiplatelet  agents. ASA Grade Assessment: III - A patient with                            severe systemic disease. After reviewing the risks                            and benefits, the patient was deemed in                            satisfactory condition to undergo the procedure.                           After obtaining informed consent, the endoscope  was                            passed under direct vision. Throughout the                            procedure, the patient's blood pressure, pulse, and                            oxygen saturations were monitored continuously. The                            EG-[id number] ([id number]) scope was introduced through the                            mouth, and advanced to the second part of duodenum.                            The upper GI endoscopy was accomplished without                            difficulty. The patient tolerated the procedure                            fairly well. Scope In: Scope Out: Findings:      Two linear esophageal ulcers with oozing blood were found in the distal       esophagus. The largest lesion was 1 mm in largest dimension. For       hemostasis, two hemostatic clips were successfully placed (MR       conditional). There was no bleeding at the end of the procedure.       Estimated blood loss: none.      One linear esophageal ulcer with no bleeding and stigmata of recent       bleeding was found in the distal esophagus. The lesion was 2 mm in       largest dimension. For hemostasis, one hemostatic clip was successfully       placed (MR conditional). There was no bleeding during, or at the end, of       the procedure.      The exam was otherwise without abnormality.      The cardia and gastric fundus were normal on retroflexion. Impression:               -  Bleeding esophageal ulcers. Clips (MR                            conditional) were placed.                           - Non-bleeding esophageal ulcer. Clip (MR                            conditional) was placed.                           ? Mallory Weiss tears vs other ulcers                           - The examination was otherwise normal.                           - No specimens collected. Moderate Sedation:      Moderate (conscious) sedation was administered by the endoscopy nurse       and supervised by the  endoscopist. The following parameters were       monitored: oxygen saturation, heart rate, blood pressure, respiratory       rate, EKG, adequacy of pulmonary ventilation, and response to care.       Total physician intraservice time was 13 minutes. Recommendation:           - Return patient to hospital ward for ongoing care.                           - Clear liquid diet.                           - Continue present medications.                           THIS DOES NOT EXPLAIN ABDOMINAL CRAMPING AND MAY                            NOT EXPLAIN ALL BLEEDING - SHE HAS VOMITED SINCE                            BEING HERE SO COULD BE MALLORY WEISS TEARS FROM THAT                           WILLF/U Procedure Code(s):        --- Professional ---                           57846, Esophagogastroduodenoscopy, flexible,                            transoral; with control of bleeding, any method                           G0500, Moderate sedation services provided by the  same physician or other qualified health care                            professional performing a gastrointestinal                            endoscopic service that sedation supports,                            requiring the presence of an independent trained                            observer to assist in the monitoring of the                            patient's level of consciousness and physiological                            status; initial 15 minutes of intra-service time;                            patient age 71 years or older (additional time may                            be reported with [phone], as appropriate) Diagnosis Code(s):        --- Professional ---                           K22.11, Ulcer of esophagus with bleeding                           K92.2, Gastrointestinal hemorrhage, unspecified CPT copyright 2016 American Medical Association. All rights reserved. The codes documented in this report  are preliminary and upon coder review may  be revised to meet current compliance requirements. [name], MD 07/08/2017 5:53:30 PM This report has been signed electronically. Number of Addenda: 0

## 2017-07-08 NOTE — Progress Notes (Signed)
Pt c/o headache 9/10 at the right side, possibly related to discomfort in her right eye. Tylenol given at 2247, no relieve according to pt. Pt did not c/o pain until now after Tylenol was given. Baltazar Najjar, NP paged.

## 2017-07-08 NOTE — Progress Notes (Signed)
PROGRESS NOTE    [name]  TTS:[id number] DOB: 04-29-1951 DOA: 07/06/2017 PCP: Prince Solian, MD   Specialists:     Brief Narrative:  66 year old female ESRD MWF Combined systolic diastolic heart failure-moderate MR, severe pulmonary HTN-cardiac cath 2005 smooth coronaries-2012 stress Myoview was normal Diabetes mellitus type 2 HTN Previously seen by Dr. Arelia Longest 01/2015 for right upper quadrant pain and tenderness Legally blind?  Glaucoma versus complex migraine bandlike headaches Last hospitalized November volume overload  Admitted with shortness of breath chest pain hemoglobin 5.1 down from set 9.7 Prior NSAID use Troponin 0 0.07 BUN 97 creatinine 11.5-found to have grade 1-2 lower extremity edema Transfused PRBC   Assessment & Plan:   Principal Problem:   Symptomatic anemia Active Problems:   Positive occult stool blood test   ESRD (end stage renal disease) (HCC)   Type II diabetes mellitus ([place])   Hypertension   Chronic combined systolic and diastolic CHF (congestive heart failure) (HCC)   Complicated migraine   Non-intractable cyclical vomiting with nausea   Generalized abdominal pain   Symptomatic anemia-history of fundic gastric polyps EGD 2016 Possibly secondary to NSAID Toradol use-gastroenterology consulted, Protonix IV every 12 hours started on admission but changed to protonix Gtt after large amount reddish vomit [had red jell-o] 12/18 Transfused 2 unit of packed red blood cells-aspirin held from admission GI planning scope 12/19 to determine source and is npo for procedure  ESRD MWF [place] Last dry weight noted 85.5 Nephrology on board  Combined Sys and diastolic HF HTN Volume managed by dialysis Amlodipine 10, Lisinopril 40, coreg 12.5 bid and Clonidine 0.1 mg bid all on hold  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 Cont eye drops  Smooth coronaries 2005 cath  DM Ty ii-currently NPO, cont sensitive coverage 128-163 sugars--no long  acting  DVT prophylaxis: SCD Code Status: Full code presumed Family Communication: None  Disposition Plan: inpatient   Consultants:   GI  Nephro  Procedures:   none  Antimicrobials:   none    Subjective:  Awake alert no further vomit, still some abd discomfort NPO for procedure In nad currently   Objective: Vitals:   07/08/17 1230 07/08/17 1300 07/08/17 1312 07/08/17 1442  BP: (!) 186/74 (!) 180/73 (!) 180/74 (!) 169/55  Pulse: 95 99 98 99  Resp:   20 16  Temp:   98.1 F (36.7 C) 98.7 F (37.1 C)  TempSrc:   Oral Oral  SpO2:   98% 91%  Weight:   84 kg (185 lb 3 oz)   Height:        Intake/Output Summary (Last 24 hours) at 07/08/2017 1501 Last data filed at 07/08/2017 1312 Gross per 24 hour  Intake 994.58 ml  Output 4210 ml  Net -3215.42 ml   Filed Weights   07/08/17 0500 07/08/17 1000 07/08/17 1312  Weight: 86.1 kg (189 lb 13.1 oz) 86.2 kg (190 lb 0.6 oz) 84 kg (185 lb 3 oz)    Examination:   Anicteric noted no Light reflex nor visualization of fingers on direct confrontation eomi ncat cta b abd slight tender in epigastrium No LE edema movesd 4 limbs equally  Data Reviewed: I have personally reviewed following labs and imaging studies  CBC: Recent Labs  Lab 07/06/17 2344 07/07/17 0706 07/07/17 1738 07/08/17 0023 07/08/17 0231 07/08/17 0915  WBC 11.3* 10.4 11.1*  --  14.1* 10.6*  NEUTROABS  --   --   --   --  10.4*  --   HGB 6.6*  7.0* 6.3* 9.6* 9.8* 7.5*  HCT 19.2* 20.3* 18.1* 28.5* 28.9* 22.1*  MCV 92.3 90.6 91.0  --  88.4 89.8  PLT 237 215 201  --  223 914   Basic Metabolic Panel: Recent Labs  Lab 07/06/17 0731 07/07/17 0706 07/08/17 0231  NA 136 135 136  K 5.3* 5.2* 3.6  CL 99* 100* 97*  CO2 21* 24 26  GLUCOSE 252* 149* 163*  BUN 97* 74* 39*  CREATININE 11.56* 8.67* 5.35*  CALCIUM 8.6* 7.9* 8.4*   GFR: Estimated Creatinine Clearance: 11.1 mL/min (A) (by C-G formula based on SCr of 5.35 mg/dL (H)). Liver Function  Tests: Recent Labs  Lab 07/06/17 0731 07/07/17 0706 07/08/17 0231  AST 22 21 31   ALT 18 16 19   ALKPHOS 60 58 66  BILITOT 0.5 0.7 1.0  PROT 5.5* 5.5* 6.5  ALBUMIN 2.8* 2.8* 3.2*   No results for input(s): LIPASE, AMYLASE in the last 168 hours. No results for input(s): AMMONIA in the last 168 hours. Coagulation Profile: No results for input(s): INR, PROTIME in the last 168 hours. Cardiac Enzymes: Recent Labs  Lab 07/06/17 0731 07/06/17 1234 07/06/17 2100 07/07/17 0706  TROPONINI <0.03 0.07* 0.23* 0.21*   BNP (last 3 results) No results for input(s): PROBNP in the last 8760 hours. HbA1C: No results for input(s): HGBA1C in the last 72 hours. CBG: Recent Labs  Lab 07/07/17 0743 07/07/17 1212 07/07/17 2153 07/08/17 0822 07/08/17 1349  GLUCAP 133* 163* 139* 138* 128*   Lipid Profile: No results for input(s): CHOL, HDL, LDLCALC, TRIG, CHOLHDL, LDLDIRECT in the last 72 hours. Thyroid Function Tests: No results for input(s): TSH, T4TOTAL, FREET4, T3FREE, THYROIDAB in the last 72 hours. Anemia Panel: Recent Labs    07/06/17 1156 07/06/17 1255  VITAMINB12  --  589  FOLATE 30.6  --   FERRITIN  --  1,992*  TIBC  --  267  IRON  --  101  RETICCTPCT  --  7.8*   Urine analysis:    Component Value Date/Time   COLORURINE YELLOW 10/27/2016 1943   APPEARANCEUR CLEAR 10/27/2016 1943   LABSPEC 1.012 10/27/2016 1943   LABSPEC 1.025 02/13/2011 1533   PHURINE 8.0 10/27/2016 1943   GLUCOSEU 150 (A) 10/27/2016 1943   HGBUR NEGATIVE 10/27/2016 1943   BILIRUBINUR NEGATIVE 10/27/2016 1943   BILIRUBINUR Negative 02/13/2011 1533   KETONESUR NEGATIVE 10/27/2016 1943   PROTEINUR 100 (A) 10/27/2016 1943   UROBILINOGEN 0.2 04/28/2011 1045   NITRITE NEGATIVE 10/27/2016 1943   LEUKOCYTESUR NEGATIVE 10/27/2016 1943   LEUKOCYTESUR Negative 02/13/2011 1533     Radiology Studies: Reviewed images personally in health database    Scheduled Meds: . Darbepoetin Alfa  200 mcg  Intravenous Q Wed-HD  . dexamethasone  10 mg Intravenous Once  . doxercalciferol  2 mcg Intravenous Q M,W,F-HD  . insulin aspart  0-5 Units Subcutaneous QHS  . insulin aspart  0-9 Units Subcutaneous TID WC  . lanthanum  2,000 mg Oral TID WC  . metoCLOPramide (REGLAN) injection  10 mg Intravenous Once  . multivitamin  1 tablet Oral QHS  . [START ON 07/11/2017] pantoprazole  40 mg Intravenous Q12H  . prednisoLONE acetate  1 drop Right Eye QID  . sodium chloride flush  3 mL Intravenous Q12H   Continuous Infusions: . sodium chloride    . sodium chloride    . pantoprozole (PROTONIX) infusion 8 mg/hr (07/08/17 0137)     LOS: 2 days    Time spent: 35  Verneita Griffes, MD [phone]   If 7PM-7AM, please contact night-coverage [email or web address] Password [email or web address] 07/08/2017, 3:01 PM

## 2017-07-08 NOTE — Progress Notes (Signed)
I have seen and examined this patient and agree with the plan of care  Patient scheduled for upper GI today   Dialysis MWF  [facility] W 07/08/2017, 1:27 PM  [facility] Progress Note  Dialysis Orders:[facility] MWF 4 hr EDW 81 400/600 heparin 2500 hectorol 2 venofer 50 mircera 75 last 12/12 right upper AVF  Recent labs: hgb 10 11/7 (Mircera 30 10/31 hgb 9.8 11/14 hgb 9.5 11/28 (Mircera 75 11/28 - tsat 35% 11/28 - on venofer 50 /week hgb 9 12/05 hgb 8.8 12/12 (Mircera 75 12/12  Assessment/Plan: 1. ABLA due to GIB -hgb had been slowing trending down as an outpatient but this admission hgb was an abrupt drop -  not due for ESA  hgb now up to 9.8 after 2 units 12/17 and 3 units 12/18 - for upper endo today 2. SOB and weakness- at least in part due to low hgb  inproved with transfusion and ultrafiltration. 3. ESRD- MWF - HD signed off earlier Monday due to cramping - it is noted that she did not have BP drop with associated cramping nor does BP drop at her outpatient unit when she cramps -back on schedule today with 3 hr treatment 4.  Hypertension/volume-titrate closer to EDW while here - has not been getting near EDW at outpatient unit due to high goals and signing off early;net UF only 900 cc Monday and 2 L Tuesday with post wt 86  5. Metabolic bone disease- Continue hectorol/fosrenol 6. Nutrition- npo - for test 7. DM - per primary 8. + trop - likely demand ischemia due to ABLA 0.07 > 0.23> 0.21  Myriam Jacobson, PA-C [facility] Beeper [phone] 07/08/2017,9:29 AM  LOS: 2 days   Subjective:   Nauseated this am.  Objective Vitals:   07/07/17 2155 07/08/17 0500 07/08/17 0532 07/08/17 0633  BP: (!) 151/78  (!) 169/62 (!) 145/54  Pulse: 99  99   Resp: 16  16   Temp: 99 F (37.2 C)  98.6 F (37 C)   TempSrc: Oral  Oral   SpO2: 95%  97%   Weight:   86.1 kg (189 lb 13.1 oz)    Height:       Physical Exam on HD General: NAD Heart:  RRR Lungs: no rales Abdomen: soft NT + BS Extremities: 1+ LE edema Dialysis Access:  Right upper AVF   Additional Objective Labs: Basic Metabolic Panel: Recent Labs  Lab 07/06/17 0731 07/07/17 0706 07/08/17 0231  NA 136 135 136  K 5.3* 5.2* 3.6  CL 99* 100* 97*  CO2 21* 24 26  GLUCOSE 252* 149* 163*  BUN 97* 74* 39*  CREATININE 11.56* 8.67* 5.35*  CALCIUM 8.6* 7.9* 8.4*   Liver Function Tests: Recent Labs  Lab 07/06/17 0731 07/07/17 0706 07/08/17 0231  AST 22 21 31   ALT 18 16 19   ALKPHOS 60 58 66  BILITOT 0.5 0.7 1.0  PROT 5.5* 5.5* 6.5  ALBUMIN 2.8* 2.8* 3.2*   No results for input(s): LIPASE, AMYLASE in the last 168 hours. CBC: Recent Labs  Lab 07/06/17 0731 07/06/17 2344 07/07/17 0706 07/07/17 1738 07/08/17 0023 07/08/17 0231  WBC 10.3 11.3* 10.4 11.1*  --  14.1*  NEUTROABS  --   --   --   --   --  10.4*  HGB 5.1* 6.6* 7.0* 6.3* 9.6* 9.8*  HCT 15.0* 19.2* 20.3* 18.1* 28.5* 28.9*  MCV 96.8 92.3 90.6 91.0  --  88.4  PLT 264 237 215 201  --  223   Blood Culture    Component Value Date/Time   SDES URINE, RANDOM 10/28/2016 0735   SPECREQUEST NONE 10/28/2016 0735   CULT MULTIPLE SPECIES PRESENT, SUGGEST RECOLLECTION (A) 10/28/2016 0735   REPTSTATUS 10/29/2016 FINAL 10/28/2016 0735    Cardiac Enzymes: Recent Labs  Lab 07/06/17 0731 07/06/17 1234 07/06/17 2100 07/07/17 0706  TROPONINI <0.03 0.07* 0.23* 0.21*   CBG: Recent Labs  Lab 07/06/17 2106 07/07/17 0743 07/07/17 1212 07/07/17 2153 07/08/17 0822  GLUCAP 136* 133* 163* 139* 138*   Iron Studies:  Recent Labs    07/06/17 1255  IRON 101  TIBC 267  FERRITIN 1,992*   Lab Results  Component Value Date   INR 1.03 06/17/2013   INR 1.02 04/21/2013   Studies/Results: No results found. Medications: . sodium chloride    . sodium chloride    . sodium chloride    . sodium chloride    .  pantoprozole (PROTONIX) infusion 8 mg/hr (07/08/17 0137)   . Darbepoetin Alfa  200 mcg Intravenous Q Wed-HD  . dexamethasone  10 mg Intravenous Once  . doxercalciferol  2 mcg Intravenous Q M,W,F-HD  . insulin aspart  0-5 Units Subcutaneous QHS  . insulin aspart  0-9 Units Subcutaneous TID WC  . lanthanum  2,000 mg Oral TID WC  . metoCLOPramide (REGLAN) injection  10 mg Intravenous Once  . multivitamin  1 tablet Oral QHS  . [START ON 07/11/2017] pantoprazole  40 mg Intravenous Q12H  . prednisoLONE acetate  1 drop Right Eye QID  . sodium chloride flush  3 mL Intravenous Q12H

## 2017-07-09 ENCOUNTER — Encounter (HOSPITAL_COMMUNITY): Payer: Self-pay | Admitting: Internal Medicine

## 2017-07-09 DIAGNOSIS — K2211 Ulcer of esophagus with bleeding: Secondary | ICD-10-CM

## 2017-07-09 DIAGNOSIS — D62 Acute posthemorrhagic anemia: Secondary | ICD-10-CM

## 2017-07-09 LAB — CBC WITH DIFFERENTIAL/PLATELET
Basophils Absolute: 0.1 10*3/uL (ref 0.0–0.1)
Basophils Relative: 1 %
EOS PCT: 2 %
Eosinophils Absolute: 0.2 10*3/uL (ref 0.0–0.7)
HCT: 22.2 % — ABNORMAL LOW (ref 36.0–46.0)
HEMOGLOBIN: 7.3 g/dL — AB (ref 12.0–15.0)
LYMPHS PCT: 19 %
Lymphs Abs: 2.1 10*3/uL (ref 0.7–4.0)
MCH: 30.5 pg (ref 26.0–34.0)
MCHC: 32.9 g/dL (ref 30.0–36.0)
MCV: 92.9 fL (ref 78.0–100.0)
MONO ABS: 1.4 10*3/uL — AB (ref 0.1–1.0)
Monocytes Relative: 12 %
NEUTROS PCT: 66 %
Neutro Abs: 7.5 10*3/uL (ref 1.7–7.7)
PLATELETS: 175 10*3/uL (ref 150–400)
RBC: 2.39 MIL/uL — AB (ref 3.87–5.11)
RDW: 20.1 % — ABNORMAL HIGH (ref 11.5–15.5)
WBC: 11.3 10*3/uL — AB (ref 4.0–10.5)

## 2017-07-09 LAB — GLUCOSE, CAPILLARY
Glucose-Capillary: 209 mg/dL — ABNORMAL HIGH (ref 65–99)
Glucose-Capillary: 184 mg/dL — ABNORMAL HIGH (ref 65–99)
Glucose-Capillary: 144 mg/dL — ABNORMAL HIGH (ref 65–99)
Glucose-Capillary: 204 mg/dL — ABNORMAL HIGH (ref 65–99)

## 2017-07-09 MED ORDER — SUCRALFATE 1 G PO TABS
1.0000 g | ORAL_TABLET | Freq: Three times a day (TID) | ORAL | Status: DC
  Administered 2017-07-09: 1 g via ORAL
  Filled 2017-07-09: qty 1

## 2017-07-09 NOTE — Progress Notes (Signed)
PROGRESS NOTE  FRANCIES INCH  VQQ:[id number] DOB: [birth year] DOA: 07/06/2017 PCP: Prince Solian, MD   Specialists:     Brief Narrative:  66 year old female ESRD MWF Combined systolic diastolic heart failure-moderate MR, severe pulmonary HTN-cardiac cath 2005 smooth coronaries-2012 stress Myoview was normal Diabetes mellitus type 2 HTN Previously seen by Dr. Arelia Longest 01/2015 for right upper quadrant pain and tenderness Legally blind?  Glaucoma versus complex migraine bandlike headaches Last hospitalized November volume overload  Admitted with shortness of breath chest pain hemoglobin 5.1 down from set 9.7 Prior NSAID use Troponin 0 0.07 BUN 97 creatinine 11.5-found to have grade 1-2 lower extremity edema Transfused PRBC   Assessment & Plan:   Principal Problem:   Symptomatic anemia Active Problems:   Positive occult stool blood test   ESRD (end stage renal disease) (HCC)   Type II diabetes mellitus (HCC)   Hypertension   Chronic combined systolic and diastolic CHF (congestive heart failure) (HCC)   Complicated migraine   Non-intractable cyclical vomiting with nausea   Generalized abdominal pain   Symptomatic anemia-history of fundic gastric polyps EGD 2016 Possibly secondary to NSAID Toradol use Transfused 2 unit of packed red blood cells-aspirin held from admission Scope 12/19=M-W tear-Gi grad diet to carb mod-added carafate but as can cause aluminium tox in ESRD would stop Protonix Gtt to conver to bid dosing Agree with transfusion at dilaysis in am 12/21  ESRD MWF [place] Last dry weight noted 85.5 Cramping at dilaysis--seems at Goal weight Nephrology on board--will cut back attempts at lowering EDW with dialysis  Combined Sys and diastolic HF HTN Volume managed by dialysis Amlodipine 10, Lisinopril 40, coreg 12.5 bid and Clonidine 0.1 mg bid all on hold  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 Cont eye drops  Smooth coronaries 2005 cath  DM Ty  ii-currently NPO, cont sensitive coverage 128-163 sugars--no long acting  DVT prophylaxis: SCD Code Status: Full code presumed Family Communication: None  Disposition Plan: inpatient   Consultants:   GI  Nephro  Procedures:   none  Antimicrobials:   none    Subjective:  Overall improved no n/v and no cp Some abd discomfort no fever no chills 1 stool earlier today-did tol clear liquids  Objective: Vitals:   07/08/17 2218 07/09/17 0054 07/09/17 0500 07/09/17 0529  BP: (!) 157/45   (!) 158/43  Pulse: 94   99  Resp: 18   17  Temp: 97.7 F (36.5 C)   98.9 F (37.2 C)  TempSrc: Oral   Oral  SpO2: 100% 97%  100%  Weight:   85.1 kg (187 lb 9.8 oz)   Height:        Intake/Output Summary (Last 24 hours) at 07/09/2017 1331 Last data filed at 07/09/2017 0852 Gross per 24 hour  Intake 1442.67 ml  Output -  Net 1442.67 ml   Filed Weights   07/08/17 1312 07/08/17 1540 07/09/17 0500  Weight: 84 kg (185 lb 3 oz) 83.9 kg (185 lb) 85.1 kg (187 lb 9.8 oz)    Examination:   eomi but poor vision 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3* 5.2* 3.6 4.2  CL 99* 100* 97* 100*  CO2 21* 24 26 27   GLUCOSE 252* 149* 163* 145*  BUN 97* 74* 39* 28*  CREATININE 11.56* 8.67* 5.35* 4.17*  CALCIUM 8.6* 7.9* 8.4* 7.8*  PHOS  --   --   --  3.3   GFR: Estimated Creatinine  Clearance: 14.4 mL/min (A) (by C-G formula based on SCr of 4.17 mg/dL (H)). Liver Function Tests: Recent Labs  Lab 07/06/17 0731 07/07/17 0706 07/08/17 0231 07/08/17 1813  AST 22 21 31   --   ALT 18 16 19   --   ALKPHOS 60 58 66  --   BILITOT 0.5 0.7 1.0  --   PROT 5.5* 5.5* 6.5  --   ALBUMIN 2.8* 2.8* 3.2* 2.6*   No results for input(s): LIPASE, AMYLASE in the last 168 hours. No results for input(s): AMMONIA in the last 168 hours. Coagulation Profile: No results for input(s): INR, PROTIME in the last 168 hours. Cardiac Enzymes: Recent Labs  Lab 07/06/17 0731 07/06/17 1234 07/06/17 2100 07/07/17 0706  TROPONINI <0.03 0.07* 0.23* 0.21*   BNP (last 3 results) No results for input(s): PROBNP in the last 8760 hours. HbA1C: No results for input(s): HGBA1C in the last 72 hours. CBG: Recent Labs  Lab 07/08/17 1546 07/08/17 1815 07/08/17 2215 07/09/17 0739 07/09/17 1208  GLUCAP 137* 137* 159* 209* 184*   Lipid Profile: No results for input(s): CHOL, HDL, LDLCALC, TRIG, CHOLHDL, LDLDIRECT in the last 72 hours. Thyroid Function Tests: No results for input(s): TSH, T4TOTAL, FREET4, T3FREE, THYROIDAB in the last 72 hours. Anemia Panel: No results for input(s): VITAMINB12, FOLATE, FERRITIN, TIBC, IRON, RETICCTPCT in the last 72 hours. Urine analysis:    Component Value Date/Time   COLORURINE YELLOW 10/27/2016 1943   APPEARANCEUR CLEAR 10/27/2016 1943   LABSPEC 1.012 10/27/2016 1943   LABSPEC 1.025 02/13/2011 1533   PHURINE 8.0 10/27/2016 1943   GLUCOSEU 150 (A) 10/27/2016 1943   HGBUR NEGATIVE 10/27/2016 1943   BILIRUBINUR NEGATIVE 10/27/2016 1943   BILIRUBINUR Negative 02/13/2011 1533   KETONESUR NEGATIVE 10/27/2016 1943   PROTEINUR 100 (A) 10/27/2016 1943   UROBILINOGEN 0.2 04/28/2011 1045   NITRITE NEGATIVE 10/27/2016 [place] NEGATIVE 10/27/2016 1943   LEUKOCYTESUR Negative 02/13/2011 1533   Radiology Studies: Reviewed images personally in health  database   Scheduled Meds: . Darbepoetin Alfa  200 mcg Intravenous Q Wed-HD  . dexamethasone  10 mg Intravenous Once  . doxercalciferol  2 mcg Intravenous Q M,W,F-HD  . insulin aspart  0-5 Units Subcutaneous QHS  . insulin aspart  0-9 Units Subcutaneous TID WC  . lanthanum  2,000 mg Oral TID WC  . metoCLOPramide (REGLAN) injection  10 mg Intravenous Once  . multivitamin  1 tablet Oral QHS  . prednisoLONE acetate  1 drop Right Eye QID  . sodium chloride flush  3 mL Intravenous Q12H   Continuous Infusions: . sodium chloride    . pantoprozole (PROTONIX) infusion 8 mg/hr (07/09/17 1027)     LOS: 3 days    Time spent: [place], MD [facility] [phone]  If 7PM-7AM, please contact night-coverage [email or web address] Password [email or web address] 07/09/2017, 1:31 PM

## 2017-07-09 NOTE — Progress Notes (Signed)
Pt had chicken broth and apple juice, tolerated well no c/o nausea at this time. Oxygen discontinued, pt SpO2 97 on room air. Will continue to monitor.

## 2017-07-09 NOTE — Progress Notes (Signed)
          Daily Rounding Note  07/09/2017, 9:39 AM  LOS: 3 days   SUBJECTIVE:   Chief complaint:     Denies nausea, vomiting.  Last BM was 2 days ago.  No abd pain  OBJECTIVE:         Vital signs in last 24 hours:    Temp:  [97.7 F (36.5 C)-98.9 F (37.2 C)] 98.9 F (37.2 C) (12/20 0529) Pulse Rate:  [94-108] 99 (12/20 0529) Resp:  [14-27] 17 (12/20 0529) BP: (147-192)/(39-140) 158/43 (12/20 0529) SpO2:  [90 %-100 %] 100 % (12/20 0529) Weight:  [83.9 kg (185 lb)-86.2 kg (190 lb 0.6 oz)] 85.1 kg (187 lb 9.8 oz) (12/20 0500) Last BM Date: 07/08/17 Filed Weights   07/08/17 1312 07/08/17 1540 07/09/17 0500  Weight: 84 kg (185 lb 3 oz) 83.9 kg (185 lb) 85.1 kg (187 lb 9.8 oz)   General: NAD   Heart: sinus tachy 102 BPM Chest: clear bil.  No dyspnea or cough Abdomen: soft, active BS.  NT.  ND  Extremities: slight LE edema.  RUE AV fistula.   Neuro/Psych:  Oriented x 3.  Blind.  Cooperative and appropriate.  Moves all 4 limbs.     Lab Results: Recent Labs    07/08/17 0915 07/08/17 1813 07/09/17 0233  WBC 10.6* 10.8* 11.3*  HGB 7.5* 7.7* 7.3*  HCT 22.1* 22.9* 22.2*  PLT 170 169 175     ASSESMENT:   *  Acute on chronic anemia.    12/19 EGD: Bleeding esophageal ulcers. Clips (MR conditional) were placed.  Non-bleeding esophageal ulcer. Clip (MR conditional) was placed.  Mallory Weiss tears vs other ulcers. S/p PRBC x 2 U.   Nausea and vomiting resolved.    *  Chronic recurrent abdominal pain, multiple unrevealing imaging studies, EGDs.   Recently with HD associated pain, ? Mesenteric ischemia.                              PLAN   *  Advance to carb mod renal diet.    *  ? Need of additional unit of blood, will defer to hospitalist, renal.  Is a bit tachy and due HD today, so 1 unit may be warranted.    *  Protonix infusion started yesterday, 72 hours up 12/21 at 2100.  Then begin Protonix 40 mg po BID for 2  weeks, drop to 40 mg daily after that.    Azucena Freed  07/09/2017, 9:39 AM Pager: [phone]    [facility] Attending   I have taken an interval history, reviewed the chart and examined the patient. I agree with the Advanced Practitioner's note, impression and recommendations.   On commode - to stool Ate well Improved  Will f/u tomorrow  [name], MD, [facility] [phone] (pager) 07/09/2017 2:12 PM

## 2017-07-09 NOTE — Progress Notes (Signed)
[name] Progress Note  Dialysis Orders:[facility] MWF 4 hr EDW 81 400/600 heparin 2500 hectorol 2 venofer 50 mircera 75 last 12/12 right upper AVF  Recent labs: hgb 10 11/7 (Mircera 30 10/31 hgb 9.8 11/14 hgb 9.5 11/28 (Mircera 75 11/28 - tsat 35% 11/28 - on venofer 50 /week hgb 9 12/05 hgb 8.8 12/12 (Mircera 75 12/12  Assessment/Plan: 1. ABLA due to GIB -s/p  2 units 12/17 and 3 units 12/18 -upper endo showed bleeding and nonbleeding esophogeal ulcers with ? M-W tear. hgb drifting down - 7.3 today - anticipate she may need transfusion of at least 1 unit PRBC tomorrow on HD- reassess in am; still no explanation for abdominal cramping during dialylsis 2. SOB and weakness- at least in part due to low hgb  inproved with transfusion and ultrafiltration - much improved 3. ESRD- MWF - cramping yesterday in stomach/legs - may be that she just needs EDW ^ - next HD Friday 4. Hypertension/volume-titrate closer to EDW while here - has not been getting near EDW at outpatient unit due to high goals and signing off early;net UF only 900 cc Monday and 2 L Tuesday with post wt 86 , 2L Wed with post wt 84; BP meds are on hold - coming back up - may need to gradually resume BP meds- watch trend 5. Metabolic bone disease- Continue hectorol/fosrenol 6. Nutrition-CL-advance per GI 7. DM - per primary 8. + trop- likely demand ischemia due to ABLA 0.07 >0.23>0.21   [facility] Beeper [phone] 07/09/2017,9:18 AM  LOS: 3 days   Subjective:   Tolerated CL earlier this am  Objective Vitals:   07/08/17 2218 07/09/17 0054 07/09/17 0500 07/09/17 0529  BP: (!) 157/45   (!) 158/43  Pulse: 94   99  Resp: 18   17  Temp: 97.7 F (36.5 C)   98.9 F (37.2 C)  TempSrc: Oral   Oral  SpO2: 100% 97%  100%  Weight:   85.1 kg (187 lb 9.8 oz)   Height:        Physical Exam General: NAD blind Heart: RRR Lungs: no rales Abdomen: obese soft NT Extremities: SCDs in place - no sig edema Dialysis Access: right Upper AVF + bruit   Additional Objective Labs: Basic Metabolic Panel: Recent Labs  Lab 07/07/17 0706 07/08/17 0231 07/08/17 1813  NA 135 136 136  K 5.2* 3.6 4.2  CL 100* 97* 100*  CO2 24 26 27   GLUCOSE 149* 163* 145*  BUN 74* 39* 28*  CREATININE 8.67* 5.35* 4.17*  CALCIUM 7.9* 8.4* 7.8*  PHOS  --   --  3.3   Liver Function Tests: Recent Labs  Lab 07/06/17 0731 07/07/17 0706 07/08/17 0231 07/08/17 1813  AST 22 21 31   --   ALT 18 16 19   --   ALKPHOS 60 58 66  --   BILITOT 0.5 0.7 1.0  --   PROT 5.5* 5.5* 6.5  --   ALBUMIN 2.8* 2.8* 3.2* 2.6*   No results for input(s): LIPASE, AMYLASE in the last 168 hours. CBC: Recent Labs  Lab 07/07/17 1738  07/08/17 0231 07/08/17 0915 07/08/17 1813 07/09/17 0233  WBC 11.1*  --  14.1* 10.6* 10.8* 11.3*  NEUTROABS  --   --  10.4*  --   --  7.5  HGB 6.3*   < > 9.8* 7.5* 7.7* 7.3*  HCT 18.1*   < > 28.9* 22.1* 22.9* 22.2*  MCV 91.0  --  88.4 89.8 91.2 92.9  PLT 201  --  223 170 169 175   < > = values in this interval not displayed.   Blood Culture    Component Value Date/Time   SDES URINE, RANDOM 10/28/2016 0735   SPECREQUEST NONE 10/28/2016 0735   CULT MULTIPLE SPECIES PRESENT, SUGGEST RECOLLECTION (A) 10/28/2016 0735   REPTSTATUS 10/29/2016 FINAL 10/28/2016 0735    Cardiac Enzymes: Recent Labs  Lab 07/06/17 0731 07/06/17 1234 07/06/17 2100 07/07/17 0706  TROPONINI <0.03 0.07* 0.23* 0.21*   CBG: Recent Labs  Lab 07/08/17 1349 07/08/17 1546 07/08/17 1815 07/08/17 2215 07/09/17 0739  GLUCAP 128* 137* 137* 159* 209*   Iron Studies:  Recent Labs    07/06/17 1255  IRON 101  TIBC 267  FERRITIN 1,992*   Lab Results  Component Value Date   INR 1.03 06/17/2013   INR 1.02 04/21/2013   Studies/Results: No results found. Medications: . sodium  chloride    . sodium chloride    . pantoprozole (PROTONIX) infusion 8 mg/hr (07/09/17 0222)   . Darbepoetin Alfa  200 mcg Intravenous Q Wed-HD  . dexamethasone  10 mg Intravenous Once  . doxercalciferol  2 mcg Intravenous Q M,W,F-HD  . insulin aspart  0-5 Units Subcutaneous QHS  . insulin aspart  0-9 Units Subcutaneous TID WC  . lanthanum  2,000 mg Oral TID WC  . metoCLOPramide (REGLAN) injection  10 mg Intravenous Once  . multivitamin  1 tablet Oral QHS  . prednisoLONE acetate  1 drop Right Eye QID  . sodium chloride flush  3 mL Intravenous Q12H

## 2017-07-10 LAB — RENAL FUNCTION PANEL
Albumin: 2.8 g/dL — ABNORMAL LOW (ref 3.5–5.0)
Anion gap: 10 (ref 5–15)
BUN: 62 mg/dL — ABNORMAL HIGH (ref 6–20)
CO2: 22 mmol/L (ref 22–32)
Calcium: 8.3 mg/dL — ABNORMAL LOW (ref 8.9–10.3)
Chloride: 101 mmol/L (ref 101–111)
Creatinine, Ser: 8.36 mg/dL — ABNORMAL HIGH (ref 0.44–1.00)
GFR calc Af Amer: 5 mL/min — ABNORMAL LOW (ref >60)
GFR calc non Af Amer: 4 mL/min — ABNORMAL LOW (ref >60)
Glucose, Bld: 147 mg/dL — ABNORMAL HIGH (ref 65–99)
Phosphorus: 4.9 mg/dL — ABNORMAL HIGH (ref 2.5–4.6)
Potassium: 4.4 mmol/L (ref 3.5–5.1)
Sodium: 133 mmol/L — ABNORMAL LOW (ref 135–145)

## 2017-07-10 LAB — PREPARE RBC (CROSSMATCH)

## 2017-07-10 LAB — CBC WITH DIFFERENTIAL/PLATELET
BASOS ABS: 0 10*3/uL (ref 0.0–0.1)
BASOS PCT: 0 %
EOS ABS: 0.3 10*3/uL (ref 0.0–0.7)
Eosinophils Relative: 3 %
HCT: 20.5 % — ABNORMAL LOW (ref 36.0–46.0)
HEMOGLOBIN: 6.7 g/dL — AB (ref 12.0–15.0)
LYMPHS PCT: 22 %
Lymphs Abs: 2.5 10*3/uL (ref 0.7–4.0)
MCH: 31.6 pg (ref 26.0–34.0)
MCHC: 32.7 g/dL (ref 30.0–36.0)
MCV: 96.7 fL (ref 78.0–100.0)
Monocytes Absolute: 0.8 10*3/uL (ref 0.1–1.0)
Monocytes Relative: 7 %
NEUTROS PCT: 68 %
Neutro Abs: 7.7 10*3/uL (ref 1.7–7.7)
PLATELETS: 191 10*3/uL (ref 150–400)
RBC: 2.12 MIL/uL — AB (ref 3.87–5.11)
RDW: 21.4 % — ABNORMAL HIGH (ref 11.5–15.5)
WBC: 11.3 10*3/uL — AB (ref 4.0–10.5)

## 2017-07-10 LAB — GLUCOSE, CAPILLARY
Glucose-Capillary: 132 mg/dL — ABNORMAL HIGH (ref 65–99)
Glucose-Capillary: 230 mg/dL — ABNORMAL HIGH (ref 65–99)
Glucose-Capillary: 223 mg/dL — ABNORMAL HIGH (ref 65–99)

## 2017-07-10 MED ORDER — SODIUM CHLORIDE 0.9 % IV SOLN: 100.0000 mL | INTRAVENOUS | Status: DC | PRN

## 2017-07-10 MED ORDER — HEPARIN SODIUM (PORCINE) 1000 UNIT/ML DIALYSIS
1000.0000 [IU] | INTRAMUSCULAR | Status: DC | PRN
  Filled 2017-07-10: qty 1

## 2017-07-10 MED ORDER — DOXERCALCIFEROL 4 MCG/2ML IV SOLN
INTRAVENOUS | Status: AC
  Filled 2017-07-10: qty 2

## 2017-07-10 MED ORDER — SODIUM CHLORIDE 0.9 % IV SOLN
Freq: Once | INTRAVENOUS | Status: AC
  Administered 2017-07-10: 14:00:00 via INTRAVENOUS

## 2017-07-10 MED ORDER — TRAMADOL HCL 50 MG PO TABS
100.0000 mg | ORAL_TABLET | Freq: Four times a day (QID) | ORAL | Status: AC
  Administered 2017-07-10 (×2): 100 mg via ORAL
  Filled 2017-07-10 (×2): qty 2

## 2017-07-10 MED ORDER — PENTAFLUOROPROP-TETRAFLUOROETH EX AERO: 1.0000 "application " | INHALATION_SPRAY | CUTANEOUS | Status: DC | PRN

## 2017-07-10 MED ORDER — TRAMADOL HCL 50 MG PO TABS
ORAL_TABLET | ORAL | Status: AC
  Filled 2017-07-10: qty 1

## 2017-07-10 MED ORDER — PANTOPRAZOLE SODIUM 40 MG PO TBEC
40.0000 mg | DELAYED_RELEASE_TABLET | Freq: Two times a day (BID) | ORAL | Status: DC
  Administered 2017-07-10 – 2017-07-13 (×6): 40 mg via ORAL
  Filled 2017-07-10 (×6): qty 1

## 2017-07-10 MED ORDER — LIDOCAINE-PRILOCAINE 2.5-2.5 % EX CREA
1.0000 "application " | TOPICAL_CREAM | CUTANEOUS | Status: DC | PRN
  Filled 2017-07-10: qty 5

## 2017-07-10 MED ORDER — ALTEPLASE 2 MG IJ SOLR
2.0000 mg | Freq: Once | INTRAMUSCULAR | Status: DC | PRN
  Filled 2017-07-10: qty 2

## 2017-07-10 MED ORDER — LIDOCAINE HCL (PF) 1 % IJ SOLN
5.0000 mL | INTRAMUSCULAR | Status: DC | PRN
  Filled 2017-07-10: qty 5

## 2017-07-10 MED ORDER — TRAMADOL HCL 50 MG PO TABS
100.0000 mg | ORAL_TABLET | Freq: Four times a day (QID) | ORAL | Status: AC
  Administered 2017-07-10: 100 mg via ORAL
  Filled 2017-07-10: qty 2

## 2017-07-10 MED ORDER — DOCUSATE SODIUM 100 MG PO CAPS
100.0000 mg | ORAL_CAPSULE | Freq: Once | ORAL | Status: AC
  Administered 2017-07-10: 100 mg via ORAL
  Filled 2017-07-10: qty 1

## 2017-07-10 NOTE — Progress Notes (Addendum)
CRITICAL VALUE ALERT  Critical Value:  Hemoglobin 6.7  Date & Time Notied:  07/10/17 at 0622  Provider Notified: Bodenheimer,NP   Orders Received/Actions taken: No new orders received. Bo,RN from HD called this RN to get report and she informed me that she would be giving the patient blood (2 units) in HD this morning based on her critical Hgb of 6.7 this morning. See patient's order for HD under manage orders. Paged Bodenheimer,NP about this.  Transport came to pick up the patient and take her to dialysis at 0645.

## 2017-07-10 NOTE — Progress Notes (Signed)
PROGRESS NOTE  [name]  VHQ:[id number] DOB: 1950-09-28 DOA: 07/06/2017 PCP: Prince Solian, MD   Specialists:     Brief Narrative:  66 year old female ESRD MWF Combined systolic diastolic heart failure-moderate MR, severe pulmonary HTN-cardiac cath 2005 smooth coronaries-2012 stress Myoview was normal Diabetes mellitus type 2 HTN Previously seen by Dr. Carlean Purl 01/2015 for right upper quadrant pain and tenderness Legally blind?  Glaucoma versus complex migraine bandlike headaches Last hospitalized November volume overload  Admitted with shortness of breath chest pain hemoglobin 5.1 down from set 9.7 Prior NSAID use Troponin 0 0.07 BUN 97 creatinine 11.5-found to have grade 1-2 lower extremity edema Transfused PRBC   Assessment & Plan:   Principal Problem:   Symptomatic anemia Active Problems:   Positive occult stool blood test   ESRD (end stage renal disease) (HCC)   Type II diabetes mellitus (HCC)   Hypertension   Chronic combined systolic and diastolic CHF (congestive heart failure) (HCC)   Complicated migraine   Non-intractable cyclical vomiting with nausea   Generalized abdominal pain   Esophageal ulcer with bleeding   Acute blood loss anemia  Severe HA Continuing Tramadol-increased to 100 q 6 scheduled today and see if able to manage  Symptomatic anemia-history of fundic gastric polyps EGD 2016 2/2 NSAID Toradol use-Transfused 2 PRBC 12/17 then 3 PRBC 12/18 and finally 2 more PRBC 12/21-aspirin held from admission Scope 12/19=M-W tear-Gi grad diet to carb mod- Protonix Gtt to convert to bid dosing x 2 weeks, then 40 daily Defer colonsocopy/bleed scan as per GI--not sure if all bleeding explained by M-w tear?   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 Cont eye drops  Smooth coronaries 2005 cath  DM Ty ii-currently NPO, cont sensitive coverage 128-163 sugars--no long acting  DVT prophylaxis: SCD Code Status: Full code presumed Family Communication: None  Disposition Plan: inpatient   Consultants:   GI  Nephro  Procedures:   none  Antimicrobials:   none    Subjective:  Awake alert having bad HA No cp Some mild N but eating some  Objective: Vitals:   07/10/17 1000 07/10/17 1015 07/10/17 1030 07/10/17 1105  BP: (!) 177/71 (!) 180/67 (!) 170/68 (!) 190/69  Pulse: (!) 103 97 97 100  Resp: (!) 25 20    Temp: 98 F (36.7 C) 98.4 F (36.9 C)  98.5 F (36.9 C)  TempSrc:    Oral  SpO2:    98%  Weight:    83 kg (182 lb 15.7 oz)  Height:        Intake/Output Summary (Last 24 hours) at 07/10/2017 1301 Last data filed at 07/10/2017 1105 Gross per 24 hour  Intake 2130 ml  Output 1230 ml  Net 900 ml   Filed Weights   07/10/17 0500 07/10/17 0700 07/10/17 1105  Weight: 84 kg (185 lb 1.6 oz) 84 kg (185 lb 3 oz) 83 kg (182 lb 15.7 oz)    Examination:   eomi but poor vision 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-in pain cta b mildly tender in epigastrium No LE edema moves 4 limbs equally and euro grossly intact  Data Reviewed: I have personally reviewed following labs and imaging studies  CBC: Recent Labs  Lab 07/08/17 0231 07/08/17 0915 07/08/17 1813 07/09/17 0233 07/10/17 0444  WBC 14.1* 10.6* 10.8* 11.3* 11.3*  NEUTROABS 10.4*  --   --  7.5 7.7  HGB 9.8* 7.5* 7.7* 7.3* 6.7*  HCT 28.9* 22.1* 22.9* 22.2* 20.5*  MCV 88.4 89.8 91.2 92.9 96.7  PLT 223 170 169 175 726   Basic Metabolic Panel: Recent Labs  Lab 07/06/17 0731 07/07/17 0706 07/08/17 0231 07/08/17 1813 07/10/17 0729  NA 136 135 136 136 133*  K 5.3* 5.2* 3.6 4.2 4.4  CL 99* 100* 97* 100* 101  CO2 21* 24 26 27 22   GLUCOSE  252* 149* 163* 145* 147*  BUN 97* 74* 39* 28* 62*  CREATININE 11.56* 8.67* 5.35* 4.17* 8.36*  CALCIUM 8.6* 7.9* 8.4* 7.8* 8.3*  PHOS  --   --   --  3.3 4.9*   GFR: Estimated Creatinine Clearance: 7.1 mL/min (A) (by C-G formula based on SCr of 8.36 mg/dL (H)). Liver Function Tests: Recent Labs  Lab 07/06/17 0731 07/07/17 0706 07/08/17 0231 07/08/17 1813 07/10/17 0729  AST 22 21 31   --   --   ALT 18 16 19   --   --   ALKPHOS 60 58 66  --   --   BILITOT 0.5 0.7 1.0  --   --   PROT 5.5* 5.5* 6.5  --   --   ALBUMIN 2.8* 2.8* 3.2* 2.6* 2.8*   No results for input(s): LIPASE, AMYLASE in the last 168 hours. No results for input(s): AMMONIA in the last 168 hours. Coagulation Profile: No results for input(s): INR, PROTIME in the last 168 hours. Cardiac Enzymes: Recent Labs  Lab 07/06/17 0731 07/06/17 1234 07/06/17 2100 07/07/17 0706  TROPONINI <0.03 0.07* 0.23* 0.21*   BNP (last 3 results) No results for input(s): PROBNP in the last 8760 hours. HbA1C: No results for input(s): HGBA1C in the last 72 hours. CBG: Recent Labs  Lab 07/09/17 0739 07/09/17 1208 07/09/17 1739 07/09/17 2217 07/10/17 1158  GLUCAP 209* 184* 144* 204* 132*   Lipid Profile: No results for input(s): CHOL, HDL, LDLCALC, TRIG, CHOLHDL, LDLDIRECT in the last 72 hours. Thyroid Function Tests: No results for input(s): TSH, T4TOTAL, FREET4, T3FREE, THYROIDAB in the last 72 hours. Anemia Panel: No results for input(s): VITAMINB12, FOLATE, FERRITIN, TIBC, IRON, RETICCTPCT in the last 72 hours. Urine analysis:    Component Value Date/Time   COLORURINE YELLOW 10/27/2016 1943   APPEARANCEUR CLEAR 10/27/2016 1943   LABSPEC 1.012 10/27/2016 1943   LABSPEC 1.025 02/13/2011 1533   PHURINE 8.0 10/27/2016 1943   GLUCOSEU 150 (A) 10/27/2016 1943   HGBUR NEGATIVE 10/27/2016 1943   BILIRUBINUR NEGATIVE 10/27/2016 1943   BILIRUBINUR Negative 02/13/2011 1533   KETONESUR NEGATIVE 10/27/2016 1943   PROTEINUR 100  (A) 10/27/2016 1943   UROBILINOGEN 0.2 04/28/2011 1045   NITRITE NEGATIVE 10/27/2016 [place] NEGATIVE 10/27/2016 1943   LEUKOCYTESUR Negative 02/13/2011 1533   Radiology Studies: Reviewed images personally in health database   Scheduled Meds: . Darbepoetin Alfa  200 mcg Intravenous Q Wed-HD  . doxercalciferol  2 mcg Intravenous Q M,W,F-HD  . insulin aspart  0-5 Units Subcutaneous QHS  . insulin aspart  0-9 Units Subcutaneous TID WC  . lanthanum  2,000 mg Oral TID WC  . metoCLOPramide (REGLAN) injection  10 mg Intravenous Once  . multivitamin  1 tablet Oral QHS  . prednisoLONE acetate  1 drop Right Eye QID  . sodium chloride flush  3 mL Intravenous  Q12H   Continuous Infusions: . sodium chloride    . sodium chloride    . pantoprozole (PROTONIX) infusion 8 mg/hr (07/10/17 0028)     LOS: 4 days    Time spent: [place], MD [facility]   If 7PM-7AM, please contact night-coverage [email or web address] Password [email or web address] 07/10/2017, 1:01 PM

## 2017-07-10 NOTE — Progress Notes (Signed)
          Daily Rounding Note  07/10/2017, 1:22 PM  LOS: 4 days   SUBJECTIVE:   Chief complaint:  C/o headache.  She has these and related to an eye that needs to be removed, however sister has not been able to get her to opthamologist because she is in hospital and unable to make it to appointments   No BMs today.  No n/v or abd pain.    OBJECTIVE:         Vital signs in last 24 hours:    Temp:  [98 F (36.7 C)-99.1 F (37.3 C)] 98.5 F (36.9 C) (12/21 1105) Pulse Rate:  [96-109] 100 (12/21 1105) Resp:  [14-25] 20 (12/21 1015) BP: (149-190)/(34-71) 190/69 (12/21 1105) SpO2:  [92 %-98 %] 98 % (12/21 1105) Weight:  [83 kg (182 lb 15.7 oz)-84 kg (185 lb 3 oz)] 83 kg (182 lb 15.7 oz) (12/21 1105) Last BM Date: 07/08/17 Filed Weights   07/10/17 0500 07/10/17 0700 07/10/17 1105  Weight: 84 kg (185 lb 1.6 oz) 84 kg (185 lb 3 oz) 83 kg (182 lb 15.7 oz)   General: looks ill   Heart: RRR Chest: clear bil Abdomen: soft, active BS.  NT, ND     Lab Results: Recent Labs    07/08/17 1813 07/09/17 0233 07/10/17 0444  WBC 10.8* 11.3* 11.3*  HGB 7.7* 7.3* 6.7*  HCT 22.9* 22.2* 20.5*  PLT 169 175 191    ASSESMENT:   *  Acute on chronic anemia.    12/19 EGD: Bleeding esophageal ulcers. Clips (MRconditional) were placed.  Non-bleeding esophageal ulcer. Clip (MR conditional) was placed.  Mallory Weiss tears vs other ulcers. S/p PRBC x 2 U.  Hgb is down 0.5 gm over 24 hours.   Nausea and vomiting resolved.    *  Chronic recurrent abdominal pain, multiple unrevealing imaging studies, EGDs.   Recently with HD associated pain, ? Mesenteric ischemia.     PLAN   *  Supportive care.  BID oral Protonix for 1 month then qd    Azucena Freed  07/10/2017, 1:22 PM Pager: [phone]    [facility] Attending   I have taken an interval history, reviewed the chart and examined the patient. I agree with the Advanced Practitioner's note,  impression and recommendations.   She seems better re GI but eye-headache issue Less pain on HD Hgb drifted and got 1 u RBC Hopefully will stay stable  Will f/u again tomorrow [name], MD, [facility] [phone] (pager) 07/10/2017 4:27 PM

## 2017-07-10 NOTE — Procedures (Signed)
I was present at this dialysis session. I have reviewed the session itself and made appropriate changes.   Hb 6.7 this AM, will transfuse 2u PRBC with HD.    84kg bed weight this AM, set goal at 2L as we will be giving pRBCs. 2K using AVF.  PT w/o complaints; says ate chicken and rice last night.   Filed Weights   07/09/17 0500 07/10/17 0500 07/10/17 0700  Weight: 85.1 kg (187 lb 9.8 oz) 84 kg (185 lb 1.6 oz) 84 kg (185 lb 3 oz)    Recent Labs  Lab 07/08/17 1813  NA 136  K 4.2  CL 100*  CO2 27  GLUCOSE 145*  BUN 28*  CREATININE 4.17*  CALCIUM 7.8*  PHOS 3.3    Recent Labs  Lab 07/08/17 0231  07/08/17 1813 07/09/17 0233 07/10/17 0444  WBC 14.1*   < > 10.8* 11.3* 11.3*  NEUTROABS 10.4*  --   --  7.5 7.7  HGB 9.8*   < > 7.7* 7.3* 6.7*  HCT 28.9*   < > 22.9* 22.2* 20.5*  MCV 88.4   < > 91.2 92.9 96.7  PLT 223   < > 169 175 191   < > = values in this interval not displayed.    Scheduled Meds: . Darbepoetin Alfa  200 mcg Intravenous Q Wed-HD  . dexamethasone  10 mg Intravenous Once  . doxercalciferol  2 mcg Intravenous Q M,W,F-HD  . insulin aspart  0-5 Units Subcutaneous QHS  . insulin aspart  0-9 Units Subcutaneous TID WC  . lanthanum  2,000 mg Oral TID WC  . metoCLOPramide (REGLAN) injection  10 mg Intravenous Once  . multivitamin  1 tablet Oral QHS  . prednisoLONE acetate  1 drop Right Eye QID  . sodium chloride flush  3 mL Intravenous Q12H   Continuous Infusions: . sodium chloride    . sodium chloride    . sodium chloride    . sodium chloride    . pantoprozole (PROTONIX) infusion 8 mg/hr (07/10/17 0028)   PRN Meds:.sodium chloride, sodium chloride, acetaminophen **OR** acetaminophen, albuterol, alteplase, fluticasone, heparin, lidocaine (PF), lidocaine-prilocaine, ondansetron, pentafluoroprop-tetrafluoroeth, traMADol   Pearson Grippe  MD 07/10/2017, 8:16 AM

## 2017-07-11 DIAGNOSIS — K2211 Ulcer of esophagus with bleeding: Principal | ICD-10-CM

## 2017-07-11 DIAGNOSIS — K226 Gastro-esophageal laceration-hemorrhage syndrome: Secondary | ICD-10-CM

## 2017-07-11 DIAGNOSIS — D62 Acute posthemorrhagic anemia: Secondary | ICD-10-CM

## 2017-07-11 LAB — CBC WITH DIFFERENTIAL/PLATELET
BASOS PCT: 0 %
Basophils Absolute: 0 10*3/uL (ref 0.0–0.1)
EOS PCT: 0 %
Eosinophils Absolute: 0 10*3/uL (ref 0.0–0.7)
HCT: 28.7 % — ABNORMAL LOW (ref 36.0–46.0)
HEMOGLOBIN: 9.3 g/dL — AB (ref 12.0–15.0)
Lymphocytes Relative: 14 %
Lymphs Abs: 1.4 10*3/uL (ref 0.7–4.0)
MCH: 29.9 pg (ref 26.0–34.0)
MCHC: 32.4 g/dL (ref 30.0–36.0)
MCV: 92.3 fL (ref 78.0–100.0)
MONO ABS: 1 10*3/uL (ref 0.1–1.0)
Monocytes Relative: 10 %
NEUTROS PCT: 76 %
Neutro Abs: 7.6 10*3/uL (ref 1.7–7.7)
PLATELETS: 209 10*3/uL (ref 150–400)
RBC: 3.11 MIL/uL — ABNORMAL LOW (ref 3.87–5.11)
RDW: 21.4 % — ABNORMAL HIGH (ref 11.5–15.5)
WBC: 10 10*3/uL (ref 4.0–10.5)

## 2017-07-11 LAB — TYPE AND SCREEN
ABO/RH(D): A POS
Antibody Screen: NEGATIVE
Unit division: 0
Unit division: 0

## 2017-07-11 LAB — BPAM RBC
Blood Product Expiration Date: 201901142359
Blood Product Expiration Date: 201901142359
ISSUE DATE / TIME: 201812210854
ISSUE DATE / TIME: 201812211004
Unit Type and Rh: 6200
Unit Type and Rh: 6200

## 2017-07-11 LAB — GLUCOSE, CAPILLARY
Glucose-Capillary: 161 mg/dL — ABNORMAL HIGH (ref 65–99)
Glucose-Capillary: 258 mg/dL — ABNORMAL HIGH (ref 65–99)
Glucose-Capillary: 122 mg/dL — ABNORMAL HIGH (ref 65–99)
Glucose-Capillary: 178 mg/dL — ABNORMAL HIGH (ref 65–99)

## 2017-07-11 MED ORDER — BISACODYL 10 MG RE SUPP
10.0000 mg | Freq: Once | RECTAL | Status: AC
  Administered 2017-07-11: 10 mg via RECTAL
  Filled 2017-07-11: qty 1

## 2017-07-11 MED ORDER — BISACODYL 10 MG RE SUPP: 10.0000 mg | Freq: Every day | RECTAL | Status: DC | PRN

## 2017-07-11 NOTE — Progress Notes (Addendum)
     [facility] Progress Note  Chief Complaint:    Anemia.   Subjective: Upset because she cannot see to eat. Wants to go home  Objective:  Vital signs in last 24 hours: Temp:  [98.2 F (36.8 C)-98.6 F (37 C)] 98.6 F (37 C) (12/22 0557) Pulse Rate:  [89-100] 92 (12/22 0557) Resp:  [16-20] 20 (12/22 0557) BP: (156-190)/(61-69) 171/66 (12/22 0557) SpO2:  [89 %-99 %] 99 % (12/22 0557) Weight:  [182 lb 15.7 oz (83 kg)-191 lb 2.2 oz (86.7 kg)] 191 lb 2.2 oz (86.7 kg) (12/22 0138) Last BM Date: 07/08/17 General:   Alert, well-developed, black female in NAD Heart:  Regular rate and rhythm, no lower extremity edema Pulm: Normal respiratory effort, lungs CTA bilaterally without wheezes or crackles. Abdomen:  Soft, nondistended, nontender.  Normal bowel sounds, no masses felt. No hepatomegaly.    Neurologic:  Alert and  oriented x4;  grossly normal neurologically. Psych:  agitated.  Lab Results: Recent Labs    07/09/17 0233 07/10/17 0444 07/11/17 0534  WBC 11.3* 11.3* 10.0  HGB 7.3* 6.7* 9.3*  HCT 22.2* 20.5* 28.7*  PLT 175 191 209     ASSESSMENT / PLAN:   66 yo female with acute on chronic anemia, heme + stools.  EGD 12/19  >> 3 esophageal ulcers Mallory Weiss tears, some with oozing, s/p endoclip placement. Got 2 more units of blood yesterday after hgb drifted to 6.7. Hgb at 9.3 today.  -continue BID PPI for next 14 days then daily   LOS: 5 days   Tye Savoy ,NP 07/11/2017, 10:03 AM  Pager number [phone]      [facility] Attending   I have taken an interval history, reviewed the chart and examined the patient. I agree with the Advanced Practitioner's note, impression and recommendations.   Will sign off  No new recs  Does not need a GI appointment related to these issues after dc in my opinion   [name], MD, [facility] [phone] (pager) 07/11/2017 2:45 PM

## 2017-07-11 NOTE — Progress Notes (Signed)
PROGRESS NOTE  [name]  JAS:[id number] DOB: October 15, 1950 DOA: 07/06/2017 PCP: Prince Solian, MD   Specialists:     Brief Narrative:  66 year old female ESRD MWF Combined systolic diastolic heart failure-moderate MR, severe pulmonary HTN-cardiac cath 2005 smooth coronaries-2012 stress Myoview was normal Diabetes mellitus type 2 HTN Previously seen by Dr. Carlean Purl 01/2015 for right upper quadrant pain and tenderness Legally blind?  Glaucoma versus complex migraine bandlike headaches Last hospitalized November volume overload  Admitted with shortness of breath chest pain hemoglobin 5.1 down from set 9.7 Prior NSAID use Troponin 0 0.07 BUN 97 creatinine 11.5-found to have grade 1-2 lower extremity edema Transfused PRBC   Assessment & Plan:   Principal Problem:   Symptomatic anemia Active Problems:   Positive occult stool blood test   ESRD (end stage renal disease) (HCC)   Type II diabetes mellitus (HCC)   Hypertension   Chronic combined systolic and diastolic CHF (congestive heart failure) (HCC)   Complicated migraine   Non-intractable cyclical vomiting with nausea   Generalized abdominal pain   Esophageal ulcer with bleeding   Acute blood loss anemia  Severe HA Continuing Tramadol-increased to 100 q 6 scheduled today and see if able to manage  Symptomatic anemia-history of fundic gastric polyps EGD 2016 2/2 NSAID Toradol use-Transfused 2 PRBC 12/17 then 3 PRBC 12/18.  Hemoglobin 6 2 more PRBC 12/21-now 9.3 rpt labs in am for stability.aspirin held  Scope 12/19=M-W tear and had Clipping of tears--Gi grad diet to carb mod Protonix Gtt to convert to bid dosing x 2 weeks, then 40 daily Defer colonsocopy/bleed scan as per GI--appears GI s/o and if no precipitious drop in hemoglobin could potentially d/c in 1-2 days Will ask for therapy eval to see if can return home  ESRD MWF [place] Last dry weight noted 85.5 2/2 hypoparathyroid Cramping at  dialysis--seems at Goal weight Nephrology on board--will cut back attempts at lowering EDW with dialysis-no heparing c dialysis  Combined Sys and diastolic HF HTN Volume managed by dialysis Amlodipine 10, Lisinopril 40, coreg 12.5 bid and Clonidine 0.1 mg bid all on hold--Slowly resume meds-Hold clonidine when resumed  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 Cont eye drops  Smooth coronaries 2005 cath  DM Ty ii-currently NPO, cont sensitive coverage sugars--161-258 no long acting  DVT prophylaxis: SCD Code Status: Full code presumed Family Communication:  Disposition Plan: inpatient-await Pt eval and possible d/c 1-2 days if hemoglobin stable   Consultants:   GI  Nephro  Procedures:   none  Antimicrobials:   none    Subjective:  Awake in nad-seems improved Mild N today No vomit no report dark stool No fever no chills   Objective: Vitals:   07/10/17 2316 07/11/17 0138 07/11/17 0557 07/11/17 0830  BP: (!) 156/61  (!) 171/66   Pulse: 89  92   Resp: 18  20   Temp: 98.2 F (36.8 C)  98.6 F (37 C)   TempSrc: Oral  Oral   SpO2: (!) 89%  99% 96%  Weight:  86.7 kg (191 lb 2.2 oz)    Height:        Intake/Output Summary (Last 24 hours) at 07/11/2017 1139 Last data filed at 07/11/2017 0843 Gross per 24 hour  Intake -  Output 250 ml  Net -250 ml   Filed Weights   07/10/17 0700 07/10/17 1105 07/11/17 0138  Weight: 84 kg (185 lb 3 oz) 83 kg (182 lb 15.7 oz) 86.7 kg (191 lb 2.2 oz)  Examination:   eomi but poor vision 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  Seems calm redirectable cta b s1 s2 no m/r/g-rrr Less tender in epigastrium No LE edema moves 4 limbs equally and euro grossly intact  Data Reviewed: I have personally reviewed following labs and imaging studies  CBC: Recent Labs  Lab 07/08/17 0231 07/08/17 0915 07/08/17 1813 07/09/17 0233 07/10/17 0444 07/11/17 0534  WBC 14.1* 10.6* 10.8* 11.3* 11.3* 10.0  NEUTROABS 10.4*  --   --  7.5 7.7 7.6  HGB 9.8*  7.5* 7.7* 7.3* 6.7* 9.3*  HCT 28.9* 22.1* 22.9* 22.2* 20.5* 28.7*  MCV 88.4 89.8 91.2 92.9 96.7 92.3  PLT 223 170 169 175 191 563   Basic Metabolic Panel: Recent Labs  Lab 07/06/17 0731 07/07/17 0706 07/08/17 0231 07/08/17 1813 07/10/17 0729  NA 136 135 136 136 133*  K 5.3* 5.2* 3.6 4.2 4.4  CL 99* 100* 97* 100* 101  CO2 21* 24 26 27 22   GLUCOSE 252* 149* 163* 145* 147*  BUN 97* 74* 39* 28* 62*  CREATININE 11.56* 8.67* 5.35* 4.17* 8.36*  CALCIUM 8.6* 7.9* 8.4* 7.8* 8.3*  PHOS  --   --   --  3.3 4.9*   GFR: Estimated Creatinine Clearance: 7.2 mL/min (A) (by C-G formula based on SCr of 8.36 mg/dL (H)). Liver Function Tests: Recent Labs  Lab 07/06/17 0731 07/07/17 0706 07/08/17 0231 07/08/17 1813 07/10/17 0729  AST 22 21 31   --   --   ALT 18 16 19   --   --   ALKPHOS 60 58 66  --   --   BILITOT 0.5 0.7 1.0  --   --   PROT 5.5* 5.5* 6.5  --   --   ALBUMIN 2.8* 2.8* 3.2* 2.6* 2.8*   No results for input(s): LIPASE, AMYLASE in the last 168 hours. No results for input(s): AMMONIA in the last 168 hours. Coagulation Profile: No results for input(s): INR, PROTIME in the last 168 hours. Cardiac Enzymes: Recent Labs  Lab 07/06/17 0731 07/06/17 1234 07/06/17 2100 07/07/17 0706  TROPONINI <0.03 0.07* 0.23* 0.21*   BNP (last 3 results) No results for input(s): PROBNP in the last 8760 hours. HbA1C: No results for input(s): HGBA1C in the last 72 hours. CBG: Recent Labs  Lab 07/09/17 2217 07/10/17 1158 07/10/17 1701 07/10/17 2259 07/11/17 0746  GLUCAP 204* 132* 230* 223* 161*   Lipid Profile: No results for input(s): CHOL, HDL, LDLCALC, TRIG, CHOLHDL, LDLDIRECT in the last 72 hours. Thyroid Function Tests: No results for input(s): TSH, T4TOTAL, FREET4, T3FREE, THYROIDAB in the last 72 hours. Anemia Panel: No results for input(s): VITAMINB12, FOLATE, FERRITIN, TIBC, IRON, RETICCTPCT in the last 72 hours. Urine analysis:    Component Value Date/Time   COLORURINE  YELLOW 10/27/2016 1943   APPEARANCEUR CLEAR 10/27/2016 1943   LABSPEC 1.012 10/27/2016 1943   LABSPEC 1.025 02/13/2011 1533   PHURINE 8.0 10/27/2016 1943   GLUCOSEU 150 (A) 10/27/2016 1943   HGBUR NEGATIVE 10/27/2016 1943   BILIRUBINUR NEGATIVE 10/27/2016 1943   BILIRUBINUR Negative 02/13/2011 1533   KETONESUR NEGATIVE 10/27/2016 1943   PROTEINUR 100 (A) 10/27/2016 1943   UROBILINOGEN 0.2 04/28/2011 1045   NITRITE NEGATIVE 10/27/2016 [place] NEGATIVE 10/27/2016 1943   LEUKOCYTESUR Negative 02/13/2011 1533   Radiology Studies: Reviewed images personally in health database   Scheduled Meds: . Darbepoetin Alfa  200 mcg Intravenous Q Wed-HD  . doxercalciferol  2 mcg Intravenous Q M,W,F-HD  . insulin  aspart  0-5 Units Subcutaneous QHS  . insulin aspart  0-9 Units Subcutaneous TID WC  . lanthanum  2,000 mg Oral TID WC  . metoCLOPramide (REGLAN) injection  10 mg Intravenous Once  . multivitamin  1 tablet Oral QHS  . pantoprazole  40 mg Oral BID  . prednisoLONE acetate  1 drop Right Eye QID  . sodium chloride flush  3 mL Intravenous Q12H  . traMADol  100 mg Oral Q6H   Continuous Infusions: . sodium chloride       LOS: 5 days    Time spent: [place], MD [facility] ([phone]   If 7PM-7AM, please contact night-coverage [email or web address] Password [facility] 07/11/2017, 11:39 AM

## 2017-07-11 NOTE — Progress Notes (Signed)
[facility] Progress Note  Dialysis Orders:[facility] MWF 4 hr EDW 81 400/600 heparin 2500 hectorol 2 venofer 50 mircera 75 last 12/12 right upper AVF  Recent labs: hgb 10 11/7 (Mircera 30 10/31 hgb 9.8 11/14 hgb 9.5 11/28 (Mircera 75 11/28 - tsat 35% 11/28 - on venofer 50 /week hgb 9 12/05 hgb 8.8 12/12 (Mircera 75 12/12  Assessment/Plan: 1. ABLA due to GIB -s/p  2 units 12/17, 3 units 12/18, 2 units 12/21 -upper endo showed bleeding and nonbleeding esophogeal ulcers with M-W tear. Monitor after EGD with clipping 2. SOB and weakness- at least in part due to low hgb  inproved with transfusion and ultrafiltration - much improved 3. ESRD- MWF - HD 12/23. Holding heparin 4. Hypertension/volume-titrate closer to EDW while here - has not been getting near EDW at outpatient unit due to high goals and signing off early;net UF only 900 cc Monday and 2 L Tuesday with post wt 86 , 2L Wed with post wt 84; BP meds are on hold - coming back up - may need to gradually resume BP meds- watch trend 5. Metabolic bone disease- Continue hectorol/fosrenol 6. Nutrition-advance per GI 7. DM - per primary 8. + trop- likely demand ischemia due to ABLA 0.07 >0.23>0.21   Angelica [phone] 07/11/2017,10:04 AM  LOS: 5 days   Subjective:   Tol PO. 2u PRBC yesterday, Hb up appropriately.  HD yesterday: post weight 83kg, 1L UF; tolerated well; fullt treatment    Objective Vitals:   07/10/17 1453 07/10/17 2316 07/11/17 0138 07/11/17 0557  BP: (!) 160/68 (!) 156/61  (!) 171/66  Pulse: 98 89  92  Resp: 16 18  20   Temp: 98.5 F (36.9 C) 98.2 F (36.8 C)  98.6 F (37 C)  TempSrc: Oral Oral  Oral  SpO2: 97% (!) 89%  99%  Weight:   86.7 kg (191 lb 2.2 oz)   Height:       Physical Exam General: NAD blind Heart: RRR Lungs: no rales Abdomen: obese soft  NT Extremities: SCDs in place - no sig edema Dialysis Access: right Upper AVF + bruit   Additional Objective Labs: Basic Metabolic Panel: Recent Labs  Lab 07/08/17 0231 07/08/17 1813 07/10/17 0729  NA 136 136 133*  K 3.6 4.2 4.4  CL 97* 100* 101  CO2 26 27 22   GLUCOSE 163* 145* 147*  BUN 39* 28* 62*  CREATININE 5.35* 4.17* 8.36*  CALCIUM 8.4* 7.8* 8.3*  PHOS  --  3.3 4.9*   Liver Function Tests: Recent Labs  Lab 07/06/17 0731 07/07/17 0706 07/08/17 0231 07/08/17 1813 07/10/17 0729  AST 22 21 31   --   --   ALT 18 16 19   --   --   ALKPHOS 60 58 66  --   --   BILITOT 0.5 0.7 1.0  --   --   PROT 5.5* 5.5* 6.5  --   --   ALBUMIN 2.8* 2.8* 3.2* 2.6* 2.8*   No results for input(s): LIPASE, AMYLASE in the last 168 hours. CBC: Recent Labs  Lab 07/08/17 0915 07/08/17 1813 07/09/17 0233 07/10/17 0444 07/11/17 0534  WBC 10.6* 10.8* 11.3* 11.3* 10.0  NEUTROABS  --   --  7.5 7.7 7.6  HGB 7.5* 7.7* 7.3* 6.7* 9.3*  HCT 22.1* 22.9* 22.2* 20.5* 28.7*  MCV 89.8 91.2 92.9 96.7 92.3  PLT 170 169 175 191 209   Blood Culture    Component Value Date/Time  SDES URINE, RANDOM 10/28/2016 0735   SPECREQUEST NONE 10/28/2016 0735   CULT MULTIPLE SPECIES PRESENT, SUGGEST RECOLLECTION (A) 10/28/2016 0735   REPTSTATUS 10/29/2016 FINAL 10/28/2016 0735    Cardiac Enzymes: Recent Labs  Lab 07/06/17 0731 07/06/17 1234 07/06/17 2100 07/07/17 0706  TROPONINI <0.03 0.07* 0.23* 0.21*   CBG: Recent Labs  Lab 07/09/17 2217 07/10/17 1158 07/10/17 1701 07/10/17 2259 07/11/17 0746  GLUCAP 204* 132* 230* 223* 161*   Iron Studies:  No results for input(s): IRON, TIBC, TRANSFERRIN, FERRITIN in the last 72 hours. Lab Results  Component Value Date   INR 1.03 06/17/2013   INR 1.02 04/21/2013   Studies/Results: No results found. Medications: . sodium chloride     . Darbepoetin Alfa  200 mcg Intravenous Q Wed-HD  . doxercalciferol  2 mcg Intravenous Q M,W,F-HD  . insulin  aspart  0-5 Units Subcutaneous QHS  . insulin aspart  0-9 Units Subcutaneous TID WC  . lanthanum  2,000 mg Oral TID WC  . metoCLOPramide (REGLAN) injection  10 mg Intravenous Once  . multivitamin  1 tablet Oral QHS  . pantoprazole  40 mg Oral BID  . prednisoLONE acetate  1 drop Right Eye QID  . sodium chloride flush  3 mL Intravenous Q12H  . traMADol  100 mg Oral Q6H

## 2017-07-11 NOTE — Progress Notes (Signed)
PT Cancellation Note  Patient Details Name: [name] MRN: [id number] DOB: 19-Jul-1951   Cancelled Treatment:    Reason Eval/Treat Not Completed: Patient declined, no reason specified. Pt refusing any mobility at this time despite max encouragement from PT. PT will continue to f/u with pt as available.    [place] 07/11/2017, 4:17 PM

## 2017-07-12 DIAGNOSIS — N186 End stage renal disease: Secondary | ICD-10-CM

## 2017-07-12 DIAGNOSIS — I10 Essential (primary) hypertension: Secondary | ICD-10-CM

## 2017-07-12 DIAGNOSIS — I5042 Chronic combined systolic (congestive) and diastolic (congestive) heart failure: Secondary | ICD-10-CM

## 2017-07-12 DIAGNOSIS — G43109 Migraine with aura, not intractable, without status migrainosus: Secondary | ICD-10-CM

## 2017-07-12 LAB — CBC WITH DIFFERENTIAL/PLATELET
BASOS PCT: 0 %
Basophils Absolute: 0 10*3/uL (ref 0.0–0.1)
EOS PCT: 4 %
Eosinophils Absolute: 0.4 10*3/uL (ref 0.0–0.7)
HEMATOCRIT: 28 % — AB (ref 36.0–46.0)
HEMOGLOBIN: 9.1 g/dL — AB (ref 12.0–15.0)
Lymphocytes Relative: 17 %
Lymphs Abs: 1.9 10*3/uL (ref 0.7–4.0)
MCH: 30.7 pg (ref 26.0–34.0)
MCHC: 32.5 g/dL (ref 30.0–36.0)
MCV: 94.6 fL (ref 78.0–100.0)
MONOS PCT: 9 %
Monocytes Absolute: 1 10*3/uL (ref 0.1–1.0)
NEUTROS PCT: 70 %
Neutro Abs: 7.7 10*3/uL (ref 1.7–7.7)
Platelets: 214 10*3/uL (ref 150–400)
RBC: 2.96 MIL/uL — AB (ref 3.87–5.11)
RDW: 21.5 % — ABNORMAL HIGH (ref 11.5–15.5)
WBC: 11 10*3/uL — AB (ref 4.0–10.5)

## 2017-07-12 LAB — RENAL FUNCTION PANEL
ALBUMIN: 3 g/dL — AB (ref 3.5–5.0)
ANION GAP: 12 (ref 5–15)
BUN: 39 mg/dL — AB (ref 6–20)
CO2: 25 mmol/L (ref 22–32)
Calcium: 8.3 mg/dL — ABNORMAL LOW (ref 8.9–10.3)
Chloride: 97 mmol/L — ABNORMAL LOW (ref 101–111)
Creatinine, Ser: 7.95 mg/dL — ABNORMAL HIGH (ref 0.44–1.00)
GFR, EST AFRICAN AMERICAN: 6 mL/min — AB (ref >60)
GFR, EST NON AFRICAN AMERICAN: 5 mL/min — AB (ref >60)
Glucose, Bld: 121 mg/dL — ABNORMAL HIGH (ref 65–99)
PHOSPHORUS: 5.6 mg/dL — AB (ref 2.5–4.6)
POTASSIUM: 4 mmol/L (ref 3.5–5.1)
Sodium: 134 mmol/L — ABNORMAL LOW (ref 135–145)

## 2017-07-12 LAB — GLUCOSE, CAPILLARY
Glucose-Capillary: 118 mg/dL — ABNORMAL HIGH (ref 65–99)
Glucose-Capillary: 229 mg/dL — ABNORMAL HIGH (ref 65–99)
Glucose-Capillary: 138 mg/dL — ABNORMAL HIGH (ref 65–99)

## 2017-07-12 MED ORDER — DOXERCALCIFEROL 4 MCG/2ML IV SOLN: 2.0000 ug | Freq: Once | INTRAVENOUS | Status: DC

## 2017-07-12 MED ORDER — CARVEDILOL 12.5 MG PO TABS
12.5000 mg | ORAL_TABLET | Freq: Two times a day (BID) | ORAL | Status: DC
  Administered 2017-07-12 – 2017-07-13 (×2): 12.5 mg via ORAL
  Filled 2017-07-12 (×2): qty 1

## 2017-07-12 MED ORDER — AMLODIPINE BESYLATE 10 MG PO TABS
10.0000 mg | ORAL_TABLET | Freq: Every day | ORAL | Status: DC
  Administered 2017-07-12 – 2017-07-13 (×2): 10 mg via ORAL
  Filled 2017-07-12 (×2): qty 1

## 2017-07-12 MED ORDER — DOXERCALCIFEROL 4 MCG/2ML IV SOLN: 2.0000 ug | INTRAVENOUS | Status: DC

## 2017-07-12 NOTE — Procedures (Signed)
I was present at this dialysis session. I have reviewed the session itself and made appropriate changes.   Hb stable over past 24h.  Tol diet w/o emesis.    2K bath via AVF.  2L UF goal.  Pt feels well wants to return home; no remaining inpatient renal issues.  Next HD will be 12/26.   Filed Weights   07/10/17 1105 07/11/17 0138 07/12/17 0702  Weight: 83 kg (182 lb 15.7 oz) 86.7 kg (191 lb 2.2 oz) 86.6 kg (190 lb 14.7 oz)    Recent Labs  Lab 07/10/17 0729  NA 133*  K 4.4  CL 101  CO2 22  GLUCOSE 147*  BUN 62*  CREATININE 8.36*  CALCIUM 8.3*  PHOS 4.9*    Recent Labs  Lab 07/10/17 0444 07/11/17 0534 07/12/17 0313  WBC 11.3* 10.0 11.0*  NEUTROABS 7.7 7.6 7.7  HGB 6.7* 9.3* 9.1*  HCT 20.5* 28.7* 28.0*  MCV 96.7 92.3 94.6  PLT 191 209 214    Scheduled Meds: . Darbepoetin Alfa  200 mcg Intravenous Q Wed-HD  . doxercalciferol  2 mcg Intravenous Q M,W,F-HD  . insulin aspart  0-5 Units Subcutaneous QHS  . insulin aspart  0-9 Units Subcutaneous TID WC  . lanthanum  2,000 mg Oral TID WC  . metoCLOPramide (REGLAN) injection  10 mg Intravenous Once  . multivitamin  1 tablet Oral QHS  . pantoprazole  40 mg Oral BID  . prednisoLONE acetate  1 drop Right Eye QID  . sodium chloride flush  3 mL Intravenous Q12H   Continuous Infusions: . sodium chloride     PRN Meds:.acetaminophen **OR** acetaminophen, albuterol, bisacodyl, fluticasone, ondansetron   Pearson Grippe  MD 07/12/2017, 8:45 AM

## 2017-07-12 NOTE — Progress Notes (Signed)
PROGRESS NOTE  [name] ZTI:[id number] DOB: 11-02-1950 DOA: 07/06/2017 PCP: Prince Solian, MD  HPI/Recap of past 21 hours: 66 year old female with history of ESRD MWF, Combined systolic diastolic heart failure, moderate MR, severe pulmonary HTN, Diabetes mellitus type 2, HTN, Legally blind? Glaucoma, hx of complex migraine bandlike headaches. Admitted due to shortness of breath, chest pain. Noted to have hemoglobin 5.1 down from 9.7 baseline. Prior NSAID use Troponin mildly elevated. Pt admitted for further management.  Today, pt noted to be improving overall. Denied any new symptoms, denies chest pain, SOB, abdominal pain, N/V/D/C, fever/chills.  Assessment/Plan: Principal Problem:   Symptomatic anemia Active Problems:   Positive occult stool blood test   ESRD (end stage renal disease) (HCC)   Type II diabetes mellitus (HCC)   Hypertension   Chronic combined systolic and diastolic CHF (congestive heart failure) (HCC)   Complicated migraine   Non-intractable cyclical vomiting with nausea   Generalized abdominal pain   Esophageal ulcer with bleeding   Acute blood loss anemia   #Upper GIB due to esophageal ulcers, Mallory weiss tear Stable EGD on 12/19 showed 3 esophageal ulcers Mallory Weiss tears, some with oozing, s/p endoclip placement Likely 2/2 NSAID/Toradol use-Transfused 2 PRBC 12/17 then 3 PRBC 12/18.  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 Cont eye drops  #DM type 2 Cont SSI, CBGs  #Severe  HA Continuing Tramadol-increased to 100 q 6 scheduled     Code Status: Full  Family Communication: Sister at bedside  Disposition Plan: Home 07/12/17   Consultants:  GI  Nephrology  Procedures:  None   Antimicrobials:  None  DVT prophylaxis:  SCDs   Objective: Vitals:   07/12/17 1130 07/12/17 1200 07/12/17 1240 07/12/17 1551  BP: (!) 189/55 (!) 186/37 (!) 182/26 (!) 174/74  Pulse: 92 92 95 100  Resp:   18 19  Temp:   98.6 F (37 C) (!) 97.5 F (36.4 C)  TempSrc:   Oral Oral  SpO2:   99% 100%  Weight:   80.5 kg (177 lb 7.5 oz)   Height:        Intake/Output Summary (Last 24 hours) at 07/12/2017 1903 Last data filed at 07/12/2017 1240 Gross per 24 hour  Intake -  Output 1841 ml  Net -1841 ml   Filed Weights   07/12/17 0702 07/12/17 0830 07/12/17 1240  Weight: 86.6 kg (190 lb 14.7 oz) 82.7 kg (182 lb 5.1 oz) 80.5 kg (177 lb 7.5 oz)    Exam:   General: Alert, awake, poor vision noted  Cardiovascular: S1-S2 present, no added heart sounds  Respiratory: Chest clear bilaterally  Abdomen: Soft, nontender, nondistended, bowel sounds present  Musculoskeletal: No pedal edema noted  Skin: Normal  Psychiatry: Normal mood   Data Reviewed: CBC: Recent Labs  Lab 07/08/17 0231  07/08/17 1813 07/09/17 0233 07/10/17 0444 07/11/17 0534 07/12/17 0313  WBC 14.1*   < > 10.8* 11.3* 11.3* 10.0 11.0*  NEUTROABS 10.4*  --   --  7.5 7.7 7.6 7.7  HGB 9.8*   < >  7.7* 7.3* 6.7* 9.3* 9.1*  HCT 28.9*   < > 22.9* 22.2* 20.5* 28.7* 28.0*  MCV 88.4   < > 91.2 92.9 96.7 92.3 94.6  PLT 223   < > 169 175 191 209 214   < > = values in this interval not displayed.   Basic Metabolic Panel: Recent Labs  Lab 07/07/17 0706 07/08/17 0231 07/08/17 1813 07/10/17 0729 07/12/17 0853  NA 135 136 136 133* 134*  K 5.2* 3.6 4.2 4.4 4.0  CL 100* 97* 100* 101 97*  CO2 24 26 27 22 25   GLUCOSE 149* 163* 145* 147* 121*  BUN 74* 39* 28* 62* 39*  CREATININE 8.67* 5.35*  4.17* 8.36* 7.95*  CALCIUM 7.9* 8.4* 7.8* 8.3* 8.3*  PHOS  --   --  3.3 4.9* 5.6*   GFR: Estimated Creatinine Clearance: 7.3 mL/min (A) (by C-G formula based on SCr of 7.95 mg/dL (H)). Liver Function Tests: Recent Labs  Lab 07/06/17 0731 07/07/17 0706 07/08/17 0231 07/08/17 1813 07/10/17 0729 07/12/17 0853  AST 22 21 31   --   --   --   ALT 18 16 19   --   --   --   ALKPHOS 60 58 66  --   --   --   BILITOT 0.5 0.7 1.0  --   --   --   PROT 5.5* 5.5* 6.5  --   --   --   ALBUMIN 2.8* 2.8* 3.2* 2.6* 2.8* 3.0*   No results for input(s): LIPASE, AMYLASE in the last 168 hours. No results for input(s): AMMONIA in the last 168 hours. Coagulation Profile: No results for input(s): INR, PROTIME in the last 168 hours. Cardiac Enzymes: Recent Labs  Lab 07/06/17 0731 07/06/17 1234 07/06/17 2100 07/07/17 0706  TROPONINI <0.03 0.07* 0.23* 0.21*   BNP (last 3 results) No results for input(s): PROBNP in the last 8760 hours. HbA1C: No results for input(s): HGBA1C in the last 72 hours. CBG: Recent Labs  Lab 07/11/17 1218 07/11/17 1642 07/11/17 2235 07/12/17 0808 07/12/17 1709  GLUCAP 258* 122* 178* 118* 229*   Lipid Profile: No results for input(s): CHOL, HDL, LDLCALC, TRIG, CHOLHDL, LDLDIRECT in the last 72 hours. Thyroid Function Tests: No results for input(s): TSH, T4TOTAL, FREET4, T3FREE, THYROIDAB in the last 72 hours. Anemia Panel: No results for input(s): VITAMINB12, FOLATE, FERRITIN, TIBC, IRON, RETICCTPCT in the last 72 hours. Urine analysis:    Component Value Date/Time   COLORURINE YELLOW 10/27/2016 1943   APPEARANCEUR CLEAR 10/27/2016 1943   LABSPEC 1.012 10/27/2016 1943   LABSPEC 1.025 02/13/2011 1533   PHURINE 8.0 10/27/2016 1943   GLUCOSEU 150 (A) 10/27/2016 1943   HGBUR NEGATIVE 10/27/2016 [place] 10/27/2016 1943   BILIRUBINUR Negative 02/13/2011 1533   KETONESUR NEGATIVE 10/27/2016 1943   PROTEINUR 100 (A) 10/27/2016 1943    UROBILINOGEN 0.2 04/28/2011 1045   NITRITE NEGATIVE 10/27/2016 1943   LEUKOCYTESUR NEGATIVE 10/27/2016 1943   LEUKOCYTESUR Negative 02/13/2011 1533   Sepsis Labs: @LABRCNTIP (procalcitonin:4,lacticidven:4)  ) Recent Results (from the past 240 hour(s))  MRSA PCR Screening     Status: None   Collection Time: 07/07/17  4:46 AM  Result Value Ref Range Status   MRSA by PCR NEGATIVE NEGATIVE Final    Comment:        The GeneXpert MRSA Assay (FDA approved for NASAL specimens only), is one component of a comprehensive MRSA colonization surveillance program. It is not intended to diagnose MRSA infection nor  to guide or monitor treatment for MRSA infections.       Studies: No results found.  Scheduled Meds: . amLODipine  10 mg Oral Daily  . carvedilol  12.5 mg Oral BID WC  . Darbepoetin Alfa  200 mcg Intravenous Q Wed-HD  . [START ON 07/15/2017] doxercalciferol  2 mcg Intravenous Q M,W,F-HD  . doxercalciferol  2 mcg Intravenous Once in dialysis  . insulin aspart  0-5 Units Subcutaneous QHS  . insulin aspart  0-9 Units Subcutaneous TID WC  . lanthanum  2,000 mg Oral TID WC  . metoCLOPramide (REGLAN) injection  10 mg Intravenous Once  . multivitamin  1 tablet Oral QHS  . pantoprazole  40 mg Oral BID  . prednisoLONE acetate  1 drop Right Eye QID  . sodium chloride flush  3 mL Intravenous Q12H    Continuous Infusions: . sodium chloride       LOS: 6 days     Alma Friendly, MD [facility]   If 7PM-7AM, please contact night-coverage [email or web address] Password [facility] 07/12/2017, 7:03 PM

## 2017-07-12 NOTE — Plan of Care (Signed)
Progressing

## 2017-07-12 NOTE — Evaluation (Signed)
Physical Therapy Evaluation Patient Details Name: [name] MRN: [id number] DOB: 1950-11-22 Today's Date: 07/12/2017   History of Present Illness  [name] is a 66 y.o. female with medical history significant for chronic kidney disease on hemodialysis MWF, hypertension, chronic combined systolic and diastolic heart failure in the context of moderate MR and severe pulmonary hypertension with moderate TR, anemia of chronic kidney disease, chronic hypoxemia on home O2, diabetes on SSI and arthritis.  She was last hospitalized in November secondary to volume overload.  She responded well to dialysis. Presents this admission with dyspnea, anemia with Hgb 5.1, transfused.  Clinical Impression   Pt admitted with above diagnosis. Pt currently with functional limitations due to the deficits listed below (see PT Problem List). Presents to PT with generalized weakness, dependence on RW for stability and support with amb; Rec HHPT follow up at dc;  Pt will benefit from skilled PT to increase their independence and safety with mobility to allow discharge to the venue listed below.       Follow Up Recommendations Home health PT    Equipment Recommendations  3in1 (PT)    Recommendations for Other Services OT consult     Precautions / Restrictions Precautions Precautions: Fall Precaution Comments: Blind      Mobility  Bed Mobility                  Transfers Overall transfer level: Needs assistance Equipment used: Rolling walker (2 wheeled) Transfers: Sit to/from Stand Sit to Stand: Min guard         General transfer comment: Minguard fo rsafety  Ambulation/Gait Ambulation/Gait assistance: Min guard;Min assist Ambulation Distance (Feet): 120 Feet Assistive device: Rolling walker (2 wheeled) Gait Pattern/deviations: Step-through pattern;Decreased step length - right;Decreased step length - left;Decreased stride length     General Gait Details: Noted decr  stability and dependent on UE support on RW; min assist for directions and pathfinding due to blindness  Stairs            Wheelchair Mobility    Modified Rankin (Stroke Patients Only)       Balance Overall balance assessment: Needs assistance Sitting-balance support: No upper extremity supported Sitting balance-Leahy Scale: Good       Standing balance-Leahy Scale: Poor                               Pertinent Vitals/Pain Pain Assessment: Faces Faces Pain Scale: Hurts little more Pain Location: Headache Pain Descriptors / Indicators: Headache Pain Intervention(s): Monitored during session    Home Living Family/patient expects to be discharged to:: Private residence Living Arrangements: Children;Other (Comment)(sibling, granddaughter) Available Help at Discharge: Family;Available PRN/intermittently Type of Home: House Home Access: Stairs to enter Entrance Stairs-Rails: Psychiatric nurse of Steps: several Home Layout: One level Home Equipment: Walker - 2 wheels      Prior Function Level of Independence: Needs assistance   Gait / Transfers Assistance Needed: Indep with household amb. Transport to HD with SCAT and manual w/c. Sister or daughter stays with pt on days she has HD  ADL's / Homemaking Assistance Needed: Reliant on assist for bathing. Sister preps meals. Indep with toileting and dressing.        Hand Dominance        Extremity/Trunk Assessment   Upper Extremity Assessment Upper Extremity Assessment: Overall WFL for tasks assessed(Noted L index finger deformity)    Lower Extremity Assessment Lower Extremity  Assessment: Generalized weakness       Communication   Communication: No difficulties  Cognition Arousal/Alertness: Awake/alert Behavior During Therapy: WFL for tasks assessed/performed Overall Cognitive Status: Within Functional Limits for tasks assessed                                         General Comments      Exercises     Assessment/Plan    PT Assessment Patient needs continued PT services  PT Problem List Decreased strength;Decreased activity tolerance;Decreased balance;Decreased mobility;Decreased coordination;Decreased knowledge of use of DME;Decreased knowledge of precautions       PT Treatment Interventions DME instruction;Gait training;Stair training;Functional mobility training;Therapeutic activities;Therapeutic exercise;Balance training;Patient/family education    PT Goals (Current goals can be found in the Care Plan section)  Acute Rehab PT Goals Patient Stated Goal: Hopes to be home soon PT Goal Formulation: With patient Time For Goal Achievement: 07/26/17 Potential to Achieve Goals: Good    Frequency Min 3X/week   Barriers to discharge        Co-evaluation               AM-PAC PT "6 Clicks" Daily Activity  Outcome Measure Difficulty turning over in bed (including adjusting bedclothes, sheets and blankets)?: A Little Difficulty moving from lying on back to sitting on the side of the bed? : A Little Difficulty sitting down on and standing up from a chair with arms (e.g., wheelchair, bedside commode, etc,.)?: A Little Help needed moving to and from a bed to chair (including a wheelchair)?: A Little Help needed walking in hospital room?: A Little Help needed climbing 3-5 steps with a railing? : A Little 6 Click Score: 18    End of Session Equipment Utilized During Treatment: Gait belt Activity Tolerance: Patient tolerated treatment well Patient left: in bed;with call bell/phone within reach(sitting EOB) Nurse Communication: Mobility status PT Visit Diagnosis: Other abnormalities of gait and mobility (R26.89);Muscle weakness (generalized) (M62.81)    Time: 1586-8257 PT Time Calculation (min) (ACUTE ONLY): 14 min   Charges:   PT Evaluation $PT Eval Moderate Complexity: 1 Mod     PT G Codes:        Roney Marion, PT  Acute  Rehabilitation Services Pager [phone] Office [phone]   [place] 07/12/2017, 4:21 PM

## 2017-07-13 LAB — GLUCOSE, CAPILLARY
Glucose-Capillary: 122 mg/dL — ABNORMAL HIGH (ref 65–99)
Glucose-Capillary: 175 mg/dL — ABNORMAL HIGH (ref 65–99)

## 2017-07-13 MED ORDER — PANTOPRAZOLE SODIUM 40 MG PO TBEC: 40.0000 mg | DELAYED_RELEASE_TABLET | Freq: Two times a day (BID) | ORAL | 0 refills | Status: DC

## 2017-07-13 NOTE — Care Management Important Message (Signed)
Important Message  Patient Details  Name: [name] MRN: [id number] Date of Birth: 06/26/1951   Medicare Important Message Given:  Yes    [name] May 07/13/2017, 9:54 AM

## 2017-07-13 NOTE — Discharge Summary (Signed)
Discharge Summary  [name] YPP:[id number] DOB: [birth year]  PCP: Prince Solian, MD  Admit date: 07/06/2017 Discharge date: 07/13/2017  Time spent: >84mins   Recommendations for Outpatient Follow-up:  1. PCP 2. Nephrology/HD  Discharge Diagnoses:  Active Hospital Problems   Diagnosis Date Noted  . Symptomatic anemia 07/06/2017  . Esophageal ulcer with bleeding   . Acute blood loss anemia   . Non-intractable cyclical vomiting with nausea   . Generalized abdominal pain   . Positive occult stool blood test 07/06/2017  . ESRD (end stage renal disease) ([facility]) 07/06/2017  . Type II diabetes mellitus ([place]) 07/06/2017  . Hypertension 07/06/2017  . Chronic combined systolic and diastolic CHF (congestive heart failure) ([place]) 07/06/2017  . Complicated migraine 45/80/9983    Resolved Hospital Problems  No resolved problems to display.    Discharge Condition: Stable   Diet recommendation: Renal, heart healthy   Vitals:   07/13/17 0905 07/13/17 1533  BP: (!) 114/94 (!) 136/58  Pulse: 97 93  Resp:  16  Temp:  98.3 F (36.8 C)  SpO2:  98%    History of present illness:  66 year old female with history of ESRD MWF, Combined systolic diastolic heart failure, moderate MR, severe pulmonary HTN, Diabetes mellitus type 2, HTN, Legally blind? Glaucoma, hx of complex migraine bandlike headaches. Admitted due to shortness of breath, chest pain. Noted to have hemoglobin 5.1 down from 9.7 baseline. Prior NSAID use Troponin mildly elevated. Pt admitted for further management.  Today, pt noted to be improving overall. Denied any new symptoms, denies chest pain, SOB, abdominal pain, N/V/D/C, fever/chills. Stable for discharge  Hospital Course:  Principal Problem:   Symptomatic anemia Active Problems:   Positive occult stool blood test   ESRD (end stage renal disease) (HCC)   Type II diabetes mellitus (HCC)   Hypertension   Chronic combined systolic and diastolic CHF  (congestive heart failure) (HCC)   Complicated migraine   Non-intractable cyclical vomiting with nausea   Generalized abdominal pain   Esophageal ulcer with bleeding   Acute blood loss anemia  #Upper GIB due to esophageal ulcers, Mallory weiss tear Stable EGD on 12/19 showed 3 esophageal ulcers Mallory Weiss tears, some with oozing, s/p endoclip placement Likely 2/2 NSAID/Toradol use-Transfused 2 PRBC 12/17 then 3 PRBC 12/18. 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 If you have any questions about your discharge medications  or the care you received while you were in the hospital after you are discharged, you can call the unit and asked to speak with the hospitalist on call if the hospitalist that took care of you is not available. Once you are discharged, your primary care physician will handle any further medical issues. Please note that NO REFILLS for any discharge medications  will be authorized once you are discharged, as it is imperative that you return to your primary care physician (or establish a relationship with a primary care physician if you do not have one) for your aftercare needs so that they can reassess your need for medications and monitor your lab values.  Discharge Instructions    Diet - low sodium heart healthy   Complete by:  As directed    Increase activity slowly   Complete by:  As directed      Allergies as of 07/13/2017      Reactions   Oxycodone Other (See Comments)   Hallucinations    Latex Hives   Vicodin [hydrocodone-acetaminophen] Other (See Comments)   hallucinations      Medication List    STOP taking these medications   ketorolac 10 MG tablet Commonly known as:  TORADOL   omeprazole 20 MG tablet Commonly known as:  PRILOSEC OTC     TAKE these medications   albuterol (2.5 MG/3ML) 0.083% nebulizer solution Commonly known as:  PROVENTIL Take 3 mLs (2.5 mg total) by nebulization every 6 (six) hours as needed for wheezing or shortness of breath.   amLODipine 10 MG tablet Commonly known as:  NORVASC Take 10 mg at bedtime by mouth.   aspirin 81 MG chewable tablet Chew 81 mg by mouth daily with lunch.   atropine 1 % ophthalmic solution Place 1 drop into the right eye 2 (two) times daily.   benzonatate 100 MG capsule Commonly known as:  TESSALON PERLES Take 1 capsule (100 mg total) by mouth 3 (three) times daily as needed for cough.   carvedilol 12.5 MG tablet Commonly known as:  COREG Take 12.5 mg by mouth 2 (two) times daily with a meal.   cloNIDine 0.1 MG tablet Commonly known as:  CATAPRES Take 0.1 mg 2 (two) times daily by mouth. with lunch and at bedtime   darbepoetin 200 MCG/0.4ML Soln injection Commonly known as:  ARANESP Inject 0.4 mLs (200 mcg total) into the vein every Wednesday with hemodialysis.   docusate sodium 100 MG capsule Commonly known as:  COLACE Take 100 mg by mouth daily.     doxercalciferol 4 MCG/2ML injection Commonly known as:  HECTOROL Inject 0.5 mLs (1 mcg total) into the vein every Monday, Wednesday, and Friday with hemodialysis.   fluticasone 50 MCG/ACT nasal spray Commonly known as:  FLONASE Place 1-2 sprays into both nostrils daily as needed for allergies or rhinitis.   lanthanum 1000 MG chewable tablet Commonly known as:  FOSRENOL Chew 2,000 mg by mouth 3 (three) times daily with meals.   lisinopril 40 MG tablet Commonly known as:  PRINIVIL,ZESTRIL Take 40 mg by mouth at bedtime.   NOVOLOG FLEXPEN 100 UNIT/ML FlexPen Generic drug:  insulin aspart Inject 10-16 Units into the skin See admin instructions. Sliding scale   pantoprazole 40 MG tablet Commonly known as:  PROTONIX Take 1 tablet (40 mg total) by mouth 2 (two) times daily. Take 1 tablet 2 times daily for 14 days and then 1 tablet daily   prednisoLONE acetate 1 % ophthalmic suspension Commonly known as:  PRED FORTE Place 1 drop into the right eye 4 (four) times daily.   RENA-VITE RX 1 MG Tabs Take 1 tablet by mouth daily with lunch.   sennosides 8.8 MG/5ML syrup Commonly known as:  SENOKOT Take 5 mLs by mouth 2 (two) times daily.      Allergies  Allergen Reactions  . Oxycodone Other (See Comments)    Hallucinations   . Latex Hives  . Vicodin [Hydrocodone-Acetaminophen] Other (See Comments)    hallucinations   Follow-up Information    [facility] Follow up.   Why:  Bedside commode will be delivered to bedside prior to discharge Contact information: [place] [address] [phone]        [facility] Follow up.   Specialty:  Home Health Services Why:  home health services arranged , office will call and set up home visits Contact information: [address] [place] [address] [phone]        Avva, Ravisankar, MD. Schedule an appointment as soon as possible for a visit in 1 week(s).    Specialty:  Internal Medicine Contact information: [place] [address] [phone]            The results of significant diagnostics from this hospitalization (including imaging, microbiology, ancillary and laboratory) are listed below for reference.    Significant Diagnostic Studies: Dg Chest 2 View  Result Date: 07/06/2017 CLINICAL DATA:  66 year old female with shortness of breath worsening at night. On dialysis. Initial encounter. EXAM: CHEST  2 VIEW COMPARISON:  06/03/2017 chest x-ray.  10/28/2016 chest CT. FINDINGS: Cardiomegaly. Pulmonary vascular congestion with minimal fissural thickening suggestive of mild pulmonary edema. Calcified aorta. No pneumothorax. No acute osseous abnormality. IMPRESSION: Cardiomegaly. Pulmonary vascular congestion with minimal fissural thickening suggestive of mild pulmonary edema. Aortic Atherosclerosis (ICD10-I70.0). Electronically Signed   By: Genia Del M.D.   On: 07/06/2017 07:36    Microbiology: Recent Results (from the past 240 hour(s))  MRSA PCR Screening     Status: None   Collection Time: 07/07/17  4:46 AM  Result Value Ref Range Status   MRSA by PCR NEGATIVE NEGATIVE Final    Comment:        The GeneXpert MRSA Assay (FDA approved for NASAL specimens only), is one component of a comprehensive MRSA colonization surveillance program. It is not intended to diagnose MRSA infection nor to guide or monitor treatment for MRSA infections.      Labs: Basic Metabolic Panel: Recent Labs  Lab 07/07/17 0706 07/08/17 0231 07/08/17 1813 07/10/17 0729 07/12/17 0853  NA 135 136 136 133* 134*  K 5.2* 3.6 4.2 4.4 4.0  CL 100* 97* 100* 101 97*  CO2 24 26 27 22 25   GLUCOSE 149* 163* 145* 147* 121*  BUN 74* 39* 28* 62* 39*  CREATININE 8.67* 5.35* 4.17* 8.36* 7.95*  CALCIUM 7.9* 8.4* 7.8* 8.3* 8.3*  PHOS  --   --  3.3 4.9* 5.6*   Liver Function Tests: Recent Labs  Lab 07/07/17 0706 07/08/17 0231  07/08/17 1813 07/10/17 0729 07/12/17 0853  AST 21 31  --   --   --   ALT 16 19  --   --   --   ALKPHOS 58 66  --   --   --   BILITOT 0.7 1.0  --   --   --   PROT 5.5* 6.5  --   --   --  ALBUMIN 2.8* 3.2* 2.6* 2.8* 3.0*   No results for input(s): LIPASE, AMYLASE in the last 168 hours. No results for input(s): AMMONIA in the last 168 hours. CBC: Recent Labs  Lab 07/08/17 0231  07/08/17 1813 07/09/17 0233 07/10/17 0444 07/11/17 0534 07/12/17 0313  WBC 14.1*   < > 10.8* 11.3* 11.3* 10.0 11.0*  NEUTROABS 10.4*  --   --  7.5 7.7 7.6 7.7  HGB 9.8*   < > 7.7* 7.3* 6.7* 9.3* 9.1*  HCT 28.9*   < > 22.9* 22.2* 20.5* 28.7* 28.0*  MCV 88.4   < > 91.2 92.9 96.7 92.3 94.6  PLT 223   < > 169 175 191 209 214   < > = values in this interval not displayed.   Cardiac Enzymes: Recent Labs  Lab 07/07/17 0706  TROPONINI 0.21*   BNP: BNP (last 3 results) Recent Labs    10/27/16 1452 06/05/17 0322 07/06/17 0731  BNP 2,605.8* >4,500.0* 824.3*    ProBNP (last 3 results) No results for input(s): PROBNP in the last 8760 hours.  CBG: Recent Labs  Lab 07/12/17 0808 07/12/17 1709 07/12/17 2150 07/13/17 0804 07/13/17 1140  GLUCAP 118* 229* 138* 122* 175*       Signed:  Alma Friendly, MD [facility] 07/13/2017, 10:50 PM

## 2017-07-13 NOTE — Care Management Note (Addendum)
Case Management Note  Patient Details  Name: JOSIAH WOJTASZEK MRN: [id number] Date of Birth: 21-Jan-1951  Subjective/Objective:            Admitted with symptomatic anemia.       PCP: Aggie Hacker  Action/Plan: Transition to home with home health services to follow.  Expected Discharge Date:  07/13/2017               Expected Discharge Plan:  [place]  In-House Referral:     Discharge planning Services  CM Consult  Post Acute Care Choice:    Choice offered to:  Patient  DME Arranged:  3-N-1 DME Agency:  [place]:  PT [facility] Agency:  [facility], pending MD's order. Order requested per CM by from MD.  Status of Service:  Completed, signed off  If discussed at [place] of Stay Meetings, dates discussed:    Additional Comments:  Sharin Mons, RN 07/13/2017, 11:04 AM

## 2017-07-13 NOTE — Progress Notes (Signed)
[name] to be D/C'd Home per MD order.  Discussed with the patient and all questions fully answered.  VSS, Skin clean, dry and intact without evidence of skin break down, no evidence of skin tears noted. IV catheter discontinued intact. Site without signs and symptoms of complications. Dressing and pressure applied.  An After Visit Summary was printed and given to the patient. Patient received prescription.  D/c education completed with patient/family including follow up instructions, medication list, d/c activities limitations if indicated, with other d/c instructions as indicated by MD - patient able to verbalize understanding, all questions fully answered.   Patient instructed to return to ED, call 911, or call MD for any changes in condition.   Patient escorted via [place], and D/C home via private auto.  Holley Raring 07/13/2017 1:19 PM

## 2017-07-13 NOTE — Progress Notes (Signed)
MD changed the patient's prescription to omeprazole 40 mg twice daily for 2 weeks and then switch to once a day. Patient and family was notified.

## 2017-07-13 NOTE — Progress Notes (Signed)
Physical Therapy Treatment Patient Details Name: [name] MRN: [id number] DOB: 06/28/1951 Today's Date: 07/13/2017    History of Present Illness [name] is a 66 y.o. female with medical history significant for chronic kidney disease on hemodialysis MWF, hypertension, chronic combined systolic and diastolic heart failure in the context of moderate MR and severe pulmonary hypertension with moderate TR, anemia of chronic kidney disease, chronic hypoxemia on home O2, diabetes on SSI and arthritis.  She was last hospitalized in November secondary to volume overload.  She responded well to dialysis. Presents this admission with dyspnea, anemia with Hgb 5.1, transfused.    [name] Comments    [name] is making good progress towards her goals, however continues to limited by generalized weakness. [name] currently supervision for bed mobility, min guard for transfers and minA for ambulation of 250 feet with RW and minA for ascending/descending 1 step 4 times. D/C plans remain appropriate. [name] will continue to follow acutely.    Follow Up Recommendations  Home health [name]     Equipment Recommendations  3in1 ([name])    Recommendations for Other Services OT consult     Precautions / Restrictions Precautions Precautions: Fall Precaution Comments: Blind Restrictions Weight Bearing Restrictions: No    Mobility  Bed Mobility Overal bed mobility: Needs Assistance Bed Mobility: Supine to Sit     Supine to sit: HOB elevated;Supervision     General bed mobility comments: supervision for safety, use of bedrail to pull to EoB  Transfers Overall transfer level: Needs assistance Equipment used: Rolling walker (2 wheeled) Transfers: Sit to/from Stand Sit to Stand: Min guard         General transfer comment: min guard for safety, good power up and steadying in RW  Ambulation/Gait Ambulation/Gait assistance: Min guard;Min assist Ambulation Distance (Feet): 250 Feet Assistive device: Rolling  walker (2 wheeled) Gait Pattern/deviations: Step-through pattern;Decreased step length - right;Decreased step length - left;Decreased stride length Gait velocity: slowed Gait velocity interpretation: Below normal speed for age/gender General Gait Details: minA for directions and RW steering due to blindness, 2x standing rest break    Stairs Stairs: Yes   Stair Management: One rail Right;Forwards;Step to pattern Number of Stairs: 1(4x) General stair comments: [name] utilizes bilateral handrails at home, able to ascend/descend 1 step 4x with R handrail and HHA, stability increased with each step  Wheelchair Mobility    Modified Rankin (Stroke Patients Only)       Balance Overall balance assessment: Needs assistance Sitting-balance support: No upper extremity supported Sitting balance-Leahy Scale: Good     Standing balance support: Single extremity supported;During functional activity Standing balance-Leahy Scale: Poor Standing balance comment: requires UE support for pericare                            Cognition Arousal/Alertness: Awake/alert Behavior During Therapy: WFL for tasks assessed/performed Overall Cognitive Status: Within Functional Limits for tasks assessed                                           General Comments General comments (skin integrity, edema, etc.): [name] sister present in room      Pertinent Vitals/Pain Pain Assessment: Faces Faces Pain Scale: Hurts little more Pain Location: back and shoulders Pain Descriptors / Indicators: Aching;Constant Pain Intervention(s): Limited activity within patient's tolerance;Monitored during session;Heat applied;Repositioned  [name] Goals (current goals can now be found in the care plan section) Acute Rehab [name] Goals Patient Stated Goal: Hopes to be home soon [name] Goal Formulation: With patient Time For Goal Achievement: 07/26/17 Potential to Achieve Goals: Good Progress towards [name]  goals: Progressing toward goals    Frequency    Min 3X/week      [name] Plan Current plan remains appropriate       AM-PAC [name] "6 Clicks" Daily Activity  Outcome Measure  Difficulty turning over in bed (including adjusting bedclothes, sheets and blankets)?: A Little Difficulty moving from lying on back to sitting on the side of the bed? : A Little Difficulty sitting down on and standing up from a chair with arms (e.g., wheelchair, bedside commode, etc,.)?: A Little Help needed moving to and from a bed to chair (including a wheelchair)?: A Little Help needed walking in hospital room?: A Little Help needed climbing 3-5 steps with a railing? : A Little 6 Click Score: 18    End of Session Equipment Utilized During Treatment: Gait belt Activity Tolerance: Patient tolerated treatment well Patient left: in bed;with call bell/phone within reach(sitting EOB) Nurse Communication: Mobility status [name] Visit Diagnosis: Other abnormalities of gait and mobility (R26.89);Muscle weakness (generalized) (M62.81)     Time: 8381-8403 [name] Time Calculation (min) (ACUTE ONLY): 25 min  Charges:  $Gait Training: 23-37 mins                    G Codes:       [name] B. Migdalia Dk [name], DPT Acute Rehabilitation  [phone] Pager [phone]     [place] 07/13/2017, 12:09 PM

## 2017-07-13 NOTE — Progress Notes (Signed)
MD notified of patient not being able to afford Pantoprazole since her insurance is not covering it and it cost 109 dollars. Patient is wondering if they can be prescribed something else.

## 2017-07-15 DIAGNOSIS — N186 End stage renal disease: Secondary | ICD-10-CM | POA: Diagnosis not present

## 2017-07-15 DIAGNOSIS — K2211 Ulcer of esophagus with bleeding: Secondary | ICD-10-CM | POA: Diagnosis not present

## 2017-07-15 DIAGNOSIS — N2581 Secondary hyperparathyroidism of renal origin: Secondary | ICD-10-CM | POA: Diagnosis not present

## 2017-07-15 DIAGNOSIS — I10 Essential (primary) hypertension: Secondary | ICD-10-CM | POA: Diagnosis not present

## 2017-07-15 DIAGNOSIS — D509 Iron deficiency anemia, unspecified: Secondary | ICD-10-CM | POA: Diagnosis not present

## 2017-07-15 DIAGNOSIS — D631 Anemia in chronic kidney disease: Secondary | ICD-10-CM | POA: Diagnosis not present

## 2017-07-17 DIAGNOSIS — D509 Iron deficiency anemia, unspecified: Secondary | ICD-10-CM | POA: Diagnosis not present

## 2017-07-17 DIAGNOSIS — N2581 Secondary hyperparathyroidism of renal origin: Secondary | ICD-10-CM | POA: Diagnosis not present

## 2017-07-17 DIAGNOSIS — K2211 Ulcer of esophagus with bleeding: Secondary | ICD-10-CM | POA: Diagnosis not present

## 2017-07-17 DIAGNOSIS — D631 Anemia in chronic kidney disease: Secondary | ICD-10-CM | POA: Diagnosis not present

## 2017-07-17 DIAGNOSIS — N186 End stage renal disease: Secondary | ICD-10-CM | POA: Diagnosis not present

## 2017-07-17 DIAGNOSIS — I10 Essential (primary) hypertension: Secondary | ICD-10-CM | POA: Diagnosis not present

## 2017-07-18 DIAGNOSIS — M6281 Muscle weakness (generalized): Secondary | ICD-10-CM | POA: Diagnosis not present

## 2017-07-18 DIAGNOSIS — Z992 Dependence on renal dialysis: Secondary | ICD-10-CM | POA: Diagnosis not present

## 2017-07-18 DIAGNOSIS — H543 Unqualified visual loss, both eyes: Secondary | ICD-10-CM | POA: Diagnosis not present

## 2017-07-18 DIAGNOSIS — D649 Anemia, unspecified: Secondary | ICD-10-CM | POA: Diagnosis not present

## 2017-07-18 DIAGNOSIS — M1990 Unspecified osteoarthritis, unspecified site: Secondary | ICD-10-CM | POA: Diagnosis not present

## 2017-07-18 DIAGNOSIS — Z794 Long term (current) use of insulin: Secondary | ICD-10-CM | POA: Diagnosis not present

## 2017-07-18 DIAGNOSIS — Z7982 Long term (current) use of aspirin: Secondary | ICD-10-CM | POA: Diagnosis not present

## 2017-07-18 DIAGNOSIS — I878 Other specified disorders of veins: Secondary | ICD-10-CM | POA: Diagnosis not present

## 2017-07-18 DIAGNOSIS — F419 Anxiety disorder, unspecified: Secondary | ICD-10-CM | POA: Diagnosis not present

## 2017-07-18 DIAGNOSIS — R2689 Other abnormalities of gait and mobility: Secondary | ICD-10-CM | POA: Diagnosis not present

## 2017-07-18 DIAGNOSIS — C50212 Malignant neoplasm of upper-inner quadrant of left female breast: Secondary | ICD-10-CM | POA: Diagnosis not present

## 2017-07-18 DIAGNOSIS — E1140 Type 2 diabetes mellitus with diabetic neuropathy, unspecified: Secondary | ICD-10-CM | POA: Diagnosis not present

## 2017-07-18 DIAGNOSIS — I132 Hypertensive heart and chronic kidney disease with heart failure and with stage 5 chronic kidney disease, or end stage renal disease: Secondary | ICD-10-CM | POA: Diagnosis not present

## 2017-07-18 DIAGNOSIS — G43109 Migraine with aura, not intractable, without status migrainosus: Secondary | ICD-10-CM | POA: Diagnosis not present

## 2017-07-18 DIAGNOSIS — E1122 Type 2 diabetes mellitus with diabetic chronic kidney disease: Secondary | ICD-10-CM | POA: Diagnosis not present

## 2017-07-18 DIAGNOSIS — I5042 Chronic combined systolic (congestive) and diastolic (congestive) heart failure: Secondary | ICD-10-CM | POA: Diagnosis not present

## 2017-07-18 DIAGNOSIS — N186 End stage renal disease: Secondary | ICD-10-CM | POA: Diagnosis not present

## 2017-07-18 DIAGNOSIS — K219 Gastro-esophageal reflux disease without esophagitis: Secondary | ICD-10-CM | POA: Diagnosis not present

## 2017-07-19 DIAGNOSIS — N186 End stage renal disease: Secondary | ICD-10-CM | POA: Diagnosis not present

## 2017-07-19 DIAGNOSIS — D509 Iron deficiency anemia, unspecified: Secondary | ICD-10-CM | POA: Diagnosis not present

## 2017-07-19 DIAGNOSIS — N2581 Secondary hyperparathyroidism of renal origin: Secondary | ICD-10-CM | POA: Diagnosis not present

## 2017-07-19 DIAGNOSIS — I10 Essential (primary) hypertension: Secondary | ICD-10-CM | POA: Diagnosis not present

## 2017-07-19 DIAGNOSIS — K2211 Ulcer of esophagus with bleeding: Secondary | ICD-10-CM | POA: Diagnosis not present

## 2017-07-19 DIAGNOSIS — D631 Anemia in chronic kidney disease: Secondary | ICD-10-CM | POA: Diagnosis not present

## 2017-07-20 DIAGNOSIS — E1129 Type 2 diabetes mellitus with other diabetic kidney complication: Secondary | ICD-10-CM | POA: Diagnosis not present

## 2017-07-20 DIAGNOSIS — N2581 Secondary hyperparathyroidism of renal origin: Secondary | ICD-10-CM | POA: Diagnosis not present

## 2017-07-20 DIAGNOSIS — I120 Hypertensive chronic kidney disease with stage 5 chronic kidney disease or end stage renal disease: Secondary | ICD-10-CM | POA: Diagnosis not present

## 2017-07-20 DIAGNOSIS — N186 End stage renal disease: Secondary | ICD-10-CM | POA: Diagnosis not present

## 2017-07-20 DIAGNOSIS — Z992 Dependence on renal dialysis: Secondary | ICD-10-CM | POA: Diagnosis not present

## 2017-07-22 DIAGNOSIS — D509 Iron deficiency anemia, unspecified: Secondary | ICD-10-CM | POA: Diagnosis not present

## 2017-07-22 DIAGNOSIS — E1129 Type 2 diabetes mellitus with other diabetic kidney complication: Secondary | ICD-10-CM | POA: Diagnosis not present

## 2017-07-22 DIAGNOSIS — K2211 Ulcer of esophagus with bleeding: Secondary | ICD-10-CM | POA: Diagnosis not present

## 2017-07-22 DIAGNOSIS — N2581 Secondary hyperparathyroidism of renal origin: Secondary | ICD-10-CM | POA: Diagnosis not present

## 2017-07-22 DIAGNOSIS — D631 Anemia in chronic kidney disease: Secondary | ICD-10-CM | POA: Diagnosis not present

## 2017-07-22 DIAGNOSIS — N186 End stage renal disease: Secondary | ICD-10-CM | POA: Diagnosis not present

## 2017-07-23 DIAGNOSIS — R2689 Other abnormalities of gait and mobility: Secondary | ICD-10-CM | POA: Diagnosis not present

## 2017-07-23 DIAGNOSIS — N186 End stage renal disease: Secondary | ICD-10-CM | POA: Diagnosis not present

## 2017-07-23 DIAGNOSIS — I5042 Chronic combined systolic (congestive) and diastolic (congestive) heart failure: Secondary | ICD-10-CM | POA: Diagnosis not present

## 2017-07-23 DIAGNOSIS — I132 Hypertensive heart and chronic kidney disease with heart failure and with stage 5 chronic kidney disease, or end stage renal disease: Secondary | ICD-10-CM | POA: Diagnosis not present

## 2017-07-23 DIAGNOSIS — M6281 Muscle weakness (generalized): Secondary | ICD-10-CM | POA: Diagnosis not present

## 2017-07-23 DIAGNOSIS — E1122 Type 2 diabetes mellitus with diabetic chronic kidney disease: Secondary | ICD-10-CM | POA: Diagnosis not present

## 2017-07-24 DIAGNOSIS — D631 Anemia in chronic kidney disease: Secondary | ICD-10-CM | POA: Diagnosis not present

## 2017-07-24 DIAGNOSIS — N186 End stage renal disease: Secondary | ICD-10-CM | POA: Diagnosis not present

## 2017-07-24 DIAGNOSIS — K2211 Ulcer of esophagus with bleeding: Secondary | ICD-10-CM | POA: Diagnosis not present

## 2017-07-24 DIAGNOSIS — E1129 Type 2 diabetes mellitus with other diabetic kidney complication: Secondary | ICD-10-CM | POA: Diagnosis not present

## 2017-07-24 DIAGNOSIS — D509 Iron deficiency anemia, unspecified: Secondary | ICD-10-CM | POA: Diagnosis not present

## 2017-07-24 DIAGNOSIS — N2581 Secondary hyperparathyroidism of renal origin: Secondary | ICD-10-CM | POA: Diagnosis not present

## 2017-07-27 DIAGNOSIS — D631 Anemia in chronic kidney disease: Secondary | ICD-10-CM | POA: Diagnosis not present

## 2017-07-27 DIAGNOSIS — N186 End stage renal disease: Secondary | ICD-10-CM | POA: Diagnosis not present

## 2017-07-27 DIAGNOSIS — D509 Iron deficiency anemia, unspecified: Secondary | ICD-10-CM | POA: Diagnosis not present

## 2017-07-27 DIAGNOSIS — N2581 Secondary hyperparathyroidism of renal origin: Secondary | ICD-10-CM | POA: Diagnosis not present

## 2017-07-27 DIAGNOSIS — E1129 Type 2 diabetes mellitus with other diabetic kidney complication: Secondary | ICD-10-CM | POA: Diagnosis not present

## 2017-07-27 DIAGNOSIS — K2211 Ulcer of esophagus with bleeding: Secondary | ICD-10-CM | POA: Diagnosis not present

## 2017-07-28 DIAGNOSIS — D508 Other iron deficiency anemias: Secondary | ICD-10-CM | POA: Diagnosis not present

## 2017-07-28 DIAGNOSIS — I10 Essential (primary) hypertension: Secondary | ICD-10-CM | POA: Diagnosis not present

## 2017-07-28 DIAGNOSIS — Z6830 Body mass index (BMI) 30.0-30.9, adult: Secondary | ICD-10-CM | POA: Diagnosis not present

## 2017-07-28 DIAGNOSIS — N186 End stage renal disease: Secondary | ICD-10-CM | POA: Diagnosis not present

## 2017-07-28 DIAGNOSIS — Z794 Long term (current) use of insulin: Secondary | ICD-10-CM | POA: Diagnosis not present

## 2017-07-28 DIAGNOSIS — E1129 Type 2 diabetes mellitus with other diabetic kidney complication: Secondary | ICD-10-CM | POA: Diagnosis not present

## 2017-07-29 DIAGNOSIS — K2211 Ulcer of esophagus with bleeding: Secondary | ICD-10-CM | POA: Diagnosis not present

## 2017-07-29 DIAGNOSIS — E1129 Type 2 diabetes mellitus with other diabetic kidney complication: Secondary | ICD-10-CM | POA: Diagnosis not present

## 2017-07-29 DIAGNOSIS — N2581 Secondary hyperparathyroidism of renal origin: Secondary | ICD-10-CM | POA: Diagnosis not present

## 2017-07-29 DIAGNOSIS — D509 Iron deficiency anemia, unspecified: Secondary | ICD-10-CM | POA: Diagnosis not present

## 2017-07-29 DIAGNOSIS — N186 End stage renal disease: Secondary | ICD-10-CM | POA: Diagnosis not present

## 2017-07-29 DIAGNOSIS — D631 Anemia in chronic kidney disease: Secondary | ICD-10-CM | POA: Diagnosis not present

## 2017-07-30 DIAGNOSIS — N186 End stage renal disease: Secondary | ICD-10-CM | POA: Diagnosis not present

## 2017-07-30 DIAGNOSIS — I132 Hypertensive heart and chronic kidney disease with heart failure and with stage 5 chronic kidney disease, or end stage renal disease: Secondary | ICD-10-CM | POA: Diagnosis not present

## 2017-07-30 DIAGNOSIS — E1122 Type 2 diabetes mellitus with diabetic chronic kidney disease: Secondary | ICD-10-CM | POA: Diagnosis not present

## 2017-07-30 DIAGNOSIS — M6281 Muscle weakness (generalized): Secondary | ICD-10-CM | POA: Diagnosis not present

## 2017-07-30 DIAGNOSIS — I5042 Chronic combined systolic (congestive) and diastolic (congestive) heart failure: Secondary | ICD-10-CM | POA: Diagnosis not present

## 2017-07-30 DIAGNOSIS — R2689 Other abnormalities of gait and mobility: Secondary | ICD-10-CM | POA: Diagnosis not present

## 2017-07-31 DIAGNOSIS — N186 End stage renal disease: Secondary | ICD-10-CM | POA: Diagnosis not present

## 2017-07-31 DIAGNOSIS — D631 Anemia in chronic kidney disease: Secondary | ICD-10-CM | POA: Diagnosis not present

## 2017-07-31 DIAGNOSIS — E1129 Type 2 diabetes mellitus with other diabetic kidney complication: Secondary | ICD-10-CM | POA: Diagnosis not present

## 2017-07-31 DIAGNOSIS — N2581 Secondary hyperparathyroidism of renal origin: Secondary | ICD-10-CM | POA: Diagnosis not present

## 2017-07-31 DIAGNOSIS — D509 Iron deficiency anemia, unspecified: Secondary | ICD-10-CM | POA: Diagnosis not present

## 2017-07-31 DIAGNOSIS — K2211 Ulcer of esophagus with bleeding: Secondary | ICD-10-CM | POA: Diagnosis not present

## 2017-08-03 DIAGNOSIS — N186 End stage renal disease: Secondary | ICD-10-CM | POA: Diagnosis not present

## 2017-08-03 DIAGNOSIS — D509 Iron deficiency anemia, unspecified: Secondary | ICD-10-CM | POA: Diagnosis not present

## 2017-08-03 DIAGNOSIS — E1129 Type 2 diabetes mellitus with other diabetic kidney complication: Secondary | ICD-10-CM | POA: Diagnosis not present

## 2017-08-03 DIAGNOSIS — N2581 Secondary hyperparathyroidism of renal origin: Secondary | ICD-10-CM | POA: Diagnosis not present

## 2017-08-03 DIAGNOSIS — K2211 Ulcer of esophagus with bleeding: Secondary | ICD-10-CM | POA: Diagnosis not present

## 2017-08-03 DIAGNOSIS — D631 Anemia in chronic kidney disease: Secondary | ICD-10-CM | POA: Diagnosis not present

## 2017-08-04 DIAGNOSIS — N186 End stage renal disease: Secondary | ICD-10-CM | POA: Diagnosis not present

## 2017-08-04 DIAGNOSIS — M6281 Muscle weakness (generalized): Secondary | ICD-10-CM | POA: Diagnosis not present

## 2017-08-04 DIAGNOSIS — I132 Hypertensive heart and chronic kidney disease with heart failure and with stage 5 chronic kidney disease, or end stage renal disease: Secondary | ICD-10-CM | POA: Diagnosis not present

## 2017-08-04 DIAGNOSIS — R2689 Other abnormalities of gait and mobility: Secondary | ICD-10-CM | POA: Diagnosis not present

## 2017-08-04 DIAGNOSIS — I5042 Chronic combined systolic (congestive) and diastolic (congestive) heart failure: Secondary | ICD-10-CM | POA: Diagnosis not present

## 2017-08-04 DIAGNOSIS — E1122 Type 2 diabetes mellitus with diabetic chronic kidney disease: Secondary | ICD-10-CM | POA: Diagnosis not present

## 2017-08-05 DIAGNOSIS — D509 Iron deficiency anemia, unspecified: Secondary | ICD-10-CM | POA: Diagnosis not present

## 2017-08-05 DIAGNOSIS — N2581 Secondary hyperparathyroidism of renal origin: Secondary | ICD-10-CM | POA: Diagnosis not present

## 2017-08-05 DIAGNOSIS — D631 Anemia in chronic kidney disease: Secondary | ICD-10-CM | POA: Diagnosis not present

## 2017-08-05 DIAGNOSIS — E1129 Type 2 diabetes mellitus with other diabetic kidney complication: Secondary | ICD-10-CM | POA: Diagnosis not present

## 2017-08-05 DIAGNOSIS — K2211 Ulcer of esophagus with bleeding: Secondary | ICD-10-CM | POA: Diagnosis not present

## 2017-08-05 DIAGNOSIS — N186 End stage renal disease: Secondary | ICD-10-CM | POA: Diagnosis not present

## 2017-08-06 DIAGNOSIS — I5042 Chronic combined systolic (congestive) and diastolic (congestive) heart failure: Secondary | ICD-10-CM | POA: Diagnosis not present

## 2017-08-06 DIAGNOSIS — R2689 Other abnormalities of gait and mobility: Secondary | ICD-10-CM | POA: Diagnosis not present

## 2017-08-06 DIAGNOSIS — N186 End stage renal disease: Secondary | ICD-10-CM | POA: Diagnosis not present

## 2017-08-06 DIAGNOSIS — M6281 Muscle weakness (generalized): Secondary | ICD-10-CM | POA: Diagnosis not present

## 2017-08-06 DIAGNOSIS — E1122 Type 2 diabetes mellitus with diabetic chronic kidney disease: Secondary | ICD-10-CM | POA: Diagnosis not present

## 2017-08-06 DIAGNOSIS — I132 Hypertensive heart and chronic kidney disease with heart failure and with stage 5 chronic kidney disease, or end stage renal disease: Secondary | ICD-10-CM | POA: Diagnosis not present

## 2017-08-07 DIAGNOSIS — N186 End stage renal disease: Secondary | ICD-10-CM | POA: Diagnosis not present

## 2017-08-07 DIAGNOSIS — N2581 Secondary hyperparathyroidism of renal origin: Secondary | ICD-10-CM | POA: Diagnosis not present

## 2017-08-07 DIAGNOSIS — K2211 Ulcer of esophagus with bleeding: Secondary | ICD-10-CM | POA: Diagnosis not present

## 2017-08-07 DIAGNOSIS — E1129 Type 2 diabetes mellitus with other diabetic kidney complication: Secondary | ICD-10-CM | POA: Diagnosis not present

## 2017-08-07 DIAGNOSIS — D631 Anemia in chronic kidney disease: Secondary | ICD-10-CM | POA: Diagnosis not present

## 2017-08-07 DIAGNOSIS — D509 Iron deficiency anemia, unspecified: Secondary | ICD-10-CM | POA: Diagnosis not present

## 2017-08-10 DIAGNOSIS — N186 End stage renal disease: Secondary | ICD-10-CM | POA: Diagnosis not present

## 2017-08-10 DIAGNOSIS — D509 Iron deficiency anemia, unspecified: Secondary | ICD-10-CM | POA: Diagnosis not present

## 2017-08-10 DIAGNOSIS — K2211 Ulcer of esophagus with bleeding: Secondary | ICD-10-CM | POA: Diagnosis not present

## 2017-08-10 DIAGNOSIS — D631 Anemia in chronic kidney disease: Secondary | ICD-10-CM | POA: Diagnosis not present

## 2017-08-10 DIAGNOSIS — E7849 Other hyperlipidemia: Secondary | ICD-10-CM | POA: Diagnosis not present

## 2017-08-10 DIAGNOSIS — Z6829 Body mass index (BMI) 29.0-29.9, adult: Secondary | ICD-10-CM | POA: Diagnosis not present

## 2017-08-10 DIAGNOSIS — G609 Hereditary and idiopathic neuropathy, unspecified: Secondary | ICD-10-CM | POA: Diagnosis not present

## 2017-08-10 DIAGNOSIS — E1129 Type 2 diabetes mellitus with other diabetic kidney complication: Secondary | ICD-10-CM | POA: Diagnosis not present

## 2017-08-10 DIAGNOSIS — N2581 Secondary hyperparathyroidism of renal origin: Secondary | ICD-10-CM | POA: Diagnosis not present

## 2017-08-11 DIAGNOSIS — R51 Headache: Secondary | ICD-10-CM | POA: Diagnosis not present

## 2017-08-11 DIAGNOSIS — K226 Gastro-esophageal laceration-hemorrhage syndrome: Secondary | ICD-10-CM | POA: Diagnosis not present

## 2017-08-11 DIAGNOSIS — M6281 Muscle weakness (generalized): Secondary | ICD-10-CM | POA: Diagnosis not present

## 2017-08-11 DIAGNOSIS — I5042 Chronic combined systolic (congestive) and diastolic (congestive) heart failure: Secondary | ICD-10-CM | POA: Diagnosis not present

## 2017-08-11 DIAGNOSIS — H540X55 Blindness right eye category 5, blindness left eye category 5: Secondary | ICD-10-CM | POA: Diagnosis not present

## 2017-08-11 DIAGNOSIS — R2689 Other abnormalities of gait and mobility: Secondary | ICD-10-CM | POA: Diagnosis not present

## 2017-08-11 DIAGNOSIS — I509 Heart failure, unspecified: Secondary | ICD-10-CM | POA: Diagnosis not present

## 2017-08-11 DIAGNOSIS — I132 Hypertensive heart and chronic kidney disease with heart failure and with stage 5 chronic kidney disease, or end stage renal disease: Secondary | ICD-10-CM | POA: Diagnosis not present

## 2017-08-11 DIAGNOSIS — N186 End stage renal disease: Secondary | ICD-10-CM | POA: Diagnosis not present

## 2017-08-11 DIAGNOSIS — E1122 Type 2 diabetes mellitus with diabetic chronic kidney disease: Secondary | ICD-10-CM | POA: Diagnosis not present

## 2017-08-11 DIAGNOSIS — I10 Essential (primary) hypertension: Secondary | ICD-10-CM | POA: Diagnosis not present

## 2017-08-11 DIAGNOSIS — K922 Gastrointestinal hemorrhage, unspecified: Secondary | ICD-10-CM | POA: Diagnosis not present

## 2017-08-11 DIAGNOSIS — Z6829 Body mass index (BMI) 29.0-29.9, adult: Secondary | ICD-10-CM | POA: Diagnosis not present

## 2017-08-12 DIAGNOSIS — N186 End stage renal disease: Secondary | ICD-10-CM | POA: Diagnosis not present

## 2017-08-12 DIAGNOSIS — D509 Iron deficiency anemia, unspecified: Secondary | ICD-10-CM | POA: Diagnosis not present

## 2017-08-12 DIAGNOSIS — E1142 Type 2 diabetes mellitus with diabetic polyneuropathy: Secondary | ICD-10-CM | POA: Diagnosis not present

## 2017-08-12 DIAGNOSIS — E1129 Type 2 diabetes mellitus with other diabetic kidney complication: Secondary | ICD-10-CM | POA: Diagnosis not present

## 2017-08-12 DIAGNOSIS — K2211 Ulcer of esophagus with bleeding: Secondary | ICD-10-CM | POA: Diagnosis not present

## 2017-08-12 DIAGNOSIS — N2581 Secondary hyperparathyroidism of renal origin: Secondary | ICD-10-CM | POA: Diagnosis not present

## 2017-08-12 DIAGNOSIS — D631 Anemia in chronic kidney disease: Secondary | ICD-10-CM | POA: Diagnosis not present

## 2017-08-13 DIAGNOSIS — R2689 Other abnormalities of gait and mobility: Secondary | ICD-10-CM | POA: Diagnosis not present

## 2017-08-13 DIAGNOSIS — I132 Hypertensive heart and chronic kidney disease with heart failure and with stage 5 chronic kidney disease, or end stage renal disease: Secondary | ICD-10-CM | POA: Diagnosis not present

## 2017-08-13 DIAGNOSIS — M6281 Muscle weakness (generalized): Secondary | ICD-10-CM | POA: Diagnosis not present

## 2017-08-13 DIAGNOSIS — H1789 Other corneal scars and opacities: Secondary | ICD-10-CM | POA: Diagnosis not present

## 2017-08-13 DIAGNOSIS — I5042 Chronic combined systolic (congestive) and diastolic (congestive) heart failure: Secondary | ICD-10-CM | POA: Diagnosis not present

## 2017-08-13 DIAGNOSIS — H5711 Ocular pain, right eye: Secondary | ICD-10-CM | POA: Diagnosis not present

## 2017-08-13 DIAGNOSIS — H5462 Unqualified visual loss, left eye, normal vision right eye: Secondary | ICD-10-CM | POA: Diagnosis not present

## 2017-08-13 DIAGNOSIS — H5461 Unqualified visual loss, right eye, normal vision left eye: Secondary | ICD-10-CM | POA: Diagnosis not present

## 2017-08-13 DIAGNOSIS — E1122 Type 2 diabetes mellitus with diabetic chronic kidney disease: Secondary | ICD-10-CM | POA: Diagnosis not present

## 2017-08-13 DIAGNOSIS — N186 End stage renal disease: Secondary | ICD-10-CM | POA: Diagnosis not present

## 2017-08-14 DIAGNOSIS — D631 Anemia in chronic kidney disease: Secondary | ICD-10-CM | POA: Diagnosis not present

## 2017-08-14 DIAGNOSIS — E1129 Type 2 diabetes mellitus with other diabetic kidney complication: Secondary | ICD-10-CM | POA: Diagnosis not present

## 2017-08-14 DIAGNOSIS — N2581 Secondary hyperparathyroidism of renal origin: Secondary | ICD-10-CM | POA: Diagnosis not present

## 2017-08-14 DIAGNOSIS — D509 Iron deficiency anemia, unspecified: Secondary | ICD-10-CM | POA: Diagnosis not present

## 2017-08-14 DIAGNOSIS — N186 End stage renal disease: Secondary | ICD-10-CM | POA: Diagnosis not present

## 2017-08-14 DIAGNOSIS — K2211 Ulcer of esophagus with bleeding: Secondary | ICD-10-CM | POA: Diagnosis not present

## 2017-08-17 DIAGNOSIS — D509 Iron deficiency anemia, unspecified: Secondary | ICD-10-CM | POA: Diagnosis not present

## 2017-08-17 DIAGNOSIS — E1122 Type 2 diabetes mellitus with diabetic chronic kidney disease: Secondary | ICD-10-CM | POA: Diagnosis not present

## 2017-08-17 DIAGNOSIS — E1129 Type 2 diabetes mellitus with other diabetic kidney complication: Secondary | ICD-10-CM | POA: Diagnosis not present

## 2017-08-17 DIAGNOSIS — D631 Anemia in chronic kidney disease: Secondary | ICD-10-CM | POA: Diagnosis not present

## 2017-08-17 DIAGNOSIS — R2689 Other abnormalities of gait and mobility: Secondary | ICD-10-CM | POA: Diagnosis not present

## 2017-08-17 DIAGNOSIS — M6281 Muscle weakness (generalized): Secondary | ICD-10-CM | POA: Diagnosis not present

## 2017-08-17 DIAGNOSIS — N2581 Secondary hyperparathyroidism of renal origin: Secondary | ICD-10-CM | POA: Diagnosis not present

## 2017-08-17 DIAGNOSIS — I5042 Chronic combined systolic (congestive) and diastolic (congestive) heart failure: Secondary | ICD-10-CM | POA: Diagnosis not present

## 2017-08-17 DIAGNOSIS — N186 End stage renal disease: Secondary | ICD-10-CM | POA: Diagnosis not present

## 2017-08-17 DIAGNOSIS — K2211 Ulcer of esophagus with bleeding: Secondary | ICD-10-CM | POA: Diagnosis not present

## 2017-08-17 DIAGNOSIS — I132 Hypertensive heart and chronic kidney disease with heart failure and with stage 5 chronic kidney disease, or end stage renal disease: Secondary | ICD-10-CM | POA: Diagnosis not present

## 2017-08-19 DIAGNOSIS — N2581 Secondary hyperparathyroidism of renal origin: Secondary | ICD-10-CM | POA: Diagnosis not present

## 2017-08-19 DIAGNOSIS — K2211 Ulcer of esophagus with bleeding: Secondary | ICD-10-CM | POA: Diagnosis not present

## 2017-08-19 DIAGNOSIS — E1129 Type 2 diabetes mellitus with other diabetic kidney complication: Secondary | ICD-10-CM | POA: Diagnosis not present

## 2017-08-19 DIAGNOSIS — D631 Anemia in chronic kidney disease: Secondary | ICD-10-CM | POA: Diagnosis not present

## 2017-08-19 DIAGNOSIS — N186 End stage renal disease: Secondary | ICD-10-CM | POA: Diagnosis not present

## 2017-08-19 DIAGNOSIS — D509 Iron deficiency anemia, unspecified: Secondary | ICD-10-CM | POA: Diagnosis not present

## 2017-08-20 DIAGNOSIS — I132 Hypertensive heart and chronic kidney disease with heart failure and with stage 5 chronic kidney disease, or end stage renal disease: Secondary | ICD-10-CM | POA: Diagnosis not present

## 2017-08-20 DIAGNOSIS — Z992 Dependence on renal dialysis: Secondary | ICD-10-CM | POA: Diagnosis not present

## 2017-08-20 DIAGNOSIS — M6281 Muscle weakness (generalized): Secondary | ICD-10-CM | POA: Diagnosis not present

## 2017-08-20 DIAGNOSIS — E1122 Type 2 diabetes mellitus with diabetic chronic kidney disease: Secondary | ICD-10-CM | POA: Diagnosis not present

## 2017-08-20 DIAGNOSIS — R2689 Other abnormalities of gait and mobility: Secondary | ICD-10-CM | POA: Diagnosis not present

## 2017-08-20 DIAGNOSIS — I5042 Chronic combined systolic (congestive) and diastolic (congestive) heart failure: Secondary | ICD-10-CM | POA: Diagnosis not present

## 2017-08-20 DIAGNOSIS — N186 End stage renal disease: Secondary | ICD-10-CM | POA: Diagnosis not present

## 2017-08-20 DIAGNOSIS — E1129 Type 2 diabetes mellitus with other diabetic kidney complication: Secondary | ICD-10-CM | POA: Diagnosis not present

## 2017-08-21 DIAGNOSIS — D631 Anemia in chronic kidney disease: Secondary | ICD-10-CM | POA: Diagnosis not present

## 2017-08-21 DIAGNOSIS — N186 End stage renal disease: Secondary | ICD-10-CM | POA: Diagnosis not present

## 2017-08-21 DIAGNOSIS — N2581 Secondary hyperparathyroidism of renal origin: Secondary | ICD-10-CM | POA: Diagnosis not present

## 2017-08-21 DIAGNOSIS — Z992 Dependence on renal dialysis: Secondary | ICD-10-CM | POA: Diagnosis not present

## 2017-08-21 DIAGNOSIS — K2211 Ulcer of esophagus with bleeding: Secondary | ICD-10-CM | POA: Diagnosis not present

## 2017-08-21 DIAGNOSIS — E8770 Fluid overload, unspecified: Secondary | ICD-10-CM | POA: Diagnosis not present

## 2017-08-21 DIAGNOSIS — E1129 Type 2 diabetes mellitus with other diabetic kidney complication: Secondary | ICD-10-CM | POA: Diagnosis not present

## 2017-08-21 DIAGNOSIS — D509 Iron deficiency anemia, unspecified: Secondary | ICD-10-CM | POA: Diagnosis not present

## 2017-08-24 DIAGNOSIS — K2211 Ulcer of esophagus with bleeding: Secondary | ICD-10-CM | POA: Diagnosis not present

## 2017-08-24 DIAGNOSIS — N186 End stage renal disease: Secondary | ICD-10-CM | POA: Diagnosis not present

## 2017-08-24 DIAGNOSIS — D631 Anemia in chronic kidney disease: Secondary | ICD-10-CM | POA: Diagnosis not present

## 2017-08-24 DIAGNOSIS — Z992 Dependence on renal dialysis: Secondary | ICD-10-CM | POA: Diagnosis not present

## 2017-08-24 DIAGNOSIS — N2581 Secondary hyperparathyroidism of renal origin: Secondary | ICD-10-CM | POA: Diagnosis not present

## 2017-08-24 DIAGNOSIS — D509 Iron deficiency anemia, unspecified: Secondary | ICD-10-CM | POA: Diagnosis not present

## 2017-08-26 DIAGNOSIS — K2211 Ulcer of esophagus with bleeding: Secondary | ICD-10-CM | POA: Diagnosis not present

## 2017-08-26 DIAGNOSIS — N186 End stage renal disease: Secondary | ICD-10-CM | POA: Diagnosis not present

## 2017-08-26 DIAGNOSIS — Z992 Dependence on renal dialysis: Secondary | ICD-10-CM | POA: Diagnosis not present

## 2017-08-26 DIAGNOSIS — D631 Anemia in chronic kidney disease: Secondary | ICD-10-CM | POA: Diagnosis not present

## 2017-08-26 DIAGNOSIS — N2581 Secondary hyperparathyroidism of renal origin: Secondary | ICD-10-CM | POA: Diagnosis not present

## 2017-08-26 DIAGNOSIS — D509 Iron deficiency anemia, unspecified: Secondary | ICD-10-CM | POA: Diagnosis not present

## 2017-08-28 DIAGNOSIS — N2581 Secondary hyperparathyroidism of renal origin: Secondary | ICD-10-CM | POA: Diagnosis not present

## 2017-08-28 DIAGNOSIS — K2211 Ulcer of esophagus with bleeding: Secondary | ICD-10-CM | POA: Diagnosis not present

## 2017-08-28 DIAGNOSIS — D631 Anemia in chronic kidney disease: Secondary | ICD-10-CM | POA: Diagnosis not present

## 2017-08-28 DIAGNOSIS — Z992 Dependence on renal dialysis: Secondary | ICD-10-CM | POA: Diagnosis not present

## 2017-08-28 DIAGNOSIS — D509 Iron deficiency anemia, unspecified: Secondary | ICD-10-CM | POA: Diagnosis not present

## 2017-08-28 DIAGNOSIS — N186 End stage renal disease: Secondary | ICD-10-CM | POA: Diagnosis not present

## 2017-08-31 DIAGNOSIS — N186 End stage renal disease: Secondary | ICD-10-CM | POA: Diagnosis not present

## 2017-08-31 DIAGNOSIS — D631 Anemia in chronic kidney disease: Secondary | ICD-10-CM | POA: Diagnosis not present

## 2017-08-31 DIAGNOSIS — D509 Iron deficiency anemia, unspecified: Secondary | ICD-10-CM | POA: Diagnosis not present

## 2017-08-31 DIAGNOSIS — K2211 Ulcer of esophagus with bleeding: Secondary | ICD-10-CM | POA: Diagnosis not present

## 2017-08-31 DIAGNOSIS — Z992 Dependence on renal dialysis: Secondary | ICD-10-CM | POA: Diagnosis not present

## 2017-08-31 DIAGNOSIS — N2581 Secondary hyperparathyroidism of renal origin: Secondary | ICD-10-CM | POA: Diagnosis not present

## 2017-09-02 DIAGNOSIS — Z992 Dependence on renal dialysis: Secondary | ICD-10-CM | POA: Diagnosis not present

## 2017-09-02 DIAGNOSIS — D509 Iron deficiency anemia, unspecified: Secondary | ICD-10-CM | POA: Diagnosis not present

## 2017-09-02 DIAGNOSIS — N186 End stage renal disease: Secondary | ICD-10-CM | POA: Diagnosis not present

## 2017-09-02 DIAGNOSIS — K2211 Ulcer of esophagus with bleeding: Secondary | ICD-10-CM | POA: Diagnosis not present

## 2017-09-02 DIAGNOSIS — D631 Anemia in chronic kidney disease: Secondary | ICD-10-CM | POA: Diagnosis not present

## 2017-09-02 DIAGNOSIS — N2581 Secondary hyperparathyroidism of renal origin: Secondary | ICD-10-CM | POA: Diagnosis not present

## 2017-09-04 DIAGNOSIS — D631 Anemia in chronic kidney disease: Secondary | ICD-10-CM | POA: Diagnosis not present

## 2017-09-04 DIAGNOSIS — K2211 Ulcer of esophagus with bleeding: Secondary | ICD-10-CM | POA: Diagnosis not present

## 2017-09-04 DIAGNOSIS — N2581 Secondary hyperparathyroidism of renal origin: Secondary | ICD-10-CM | POA: Diagnosis not present

## 2017-09-04 DIAGNOSIS — N186 End stage renal disease: Secondary | ICD-10-CM | POA: Diagnosis not present

## 2017-09-04 DIAGNOSIS — Z992 Dependence on renal dialysis: Secondary | ICD-10-CM | POA: Diagnosis not present

## 2017-09-04 DIAGNOSIS — D509 Iron deficiency anemia, unspecified: Secondary | ICD-10-CM | POA: Diagnosis not present

## 2017-09-05 DIAGNOSIS — N186 End stage renal disease: Secondary | ICD-10-CM | POA: Diagnosis not present

## 2017-09-05 DIAGNOSIS — D631 Anemia in chronic kidney disease: Secondary | ICD-10-CM | POA: Diagnosis not present

## 2017-09-05 DIAGNOSIS — D509 Iron deficiency anemia, unspecified: Secondary | ICD-10-CM | POA: Diagnosis not present

## 2017-09-05 DIAGNOSIS — N2581 Secondary hyperparathyroidism of renal origin: Secondary | ICD-10-CM | POA: Diagnosis not present

## 2017-09-05 DIAGNOSIS — K2211 Ulcer of esophagus with bleeding: Secondary | ICD-10-CM | POA: Diagnosis not present

## 2017-09-05 DIAGNOSIS — Z992 Dependence on renal dialysis: Secondary | ICD-10-CM | POA: Diagnosis not present

## 2017-09-07 DIAGNOSIS — N186 End stage renal disease: Secondary | ICD-10-CM | POA: Diagnosis not present

## 2017-09-07 DIAGNOSIS — K2211 Ulcer of esophagus with bleeding: Secondary | ICD-10-CM | POA: Diagnosis not present

## 2017-09-07 DIAGNOSIS — Z992 Dependence on renal dialysis: Secondary | ICD-10-CM | POA: Diagnosis not present

## 2017-09-07 DIAGNOSIS — N2581 Secondary hyperparathyroidism of renal origin: Secondary | ICD-10-CM | POA: Diagnosis not present

## 2017-09-07 DIAGNOSIS — D509 Iron deficiency anemia, unspecified: Secondary | ICD-10-CM | POA: Diagnosis not present

## 2017-09-07 DIAGNOSIS — D631 Anemia in chronic kidney disease: Secondary | ICD-10-CM | POA: Diagnosis not present

## 2017-09-09 DIAGNOSIS — D509 Iron deficiency anemia, unspecified: Secondary | ICD-10-CM | POA: Diagnosis not present

## 2017-09-09 DIAGNOSIS — Z6830 Body mass index (BMI) 30.0-30.9, adult: Secondary | ICD-10-CM | POA: Diagnosis not present

## 2017-09-09 DIAGNOSIS — S80922A Unspecified superficial injury of left lower leg, initial encounter: Secondary | ICD-10-CM | POA: Diagnosis not present

## 2017-09-09 DIAGNOSIS — N186 End stage renal disease: Secondary | ICD-10-CM | POA: Diagnosis not present

## 2017-09-09 DIAGNOSIS — N2581 Secondary hyperparathyroidism of renal origin: Secondary | ICD-10-CM | POA: Diagnosis not present

## 2017-09-09 DIAGNOSIS — Z992 Dependence on renal dialysis: Secondary | ICD-10-CM | POA: Diagnosis not present

## 2017-09-09 DIAGNOSIS — D631 Anemia in chronic kidney disease: Secondary | ICD-10-CM | POA: Diagnosis not present

## 2017-09-09 DIAGNOSIS — K2211 Ulcer of esophagus with bleeding: Secondary | ICD-10-CM | POA: Diagnosis not present

## 2017-09-11 DIAGNOSIS — K2211 Ulcer of esophagus with bleeding: Secondary | ICD-10-CM | POA: Diagnosis not present

## 2017-09-11 DIAGNOSIS — Z992 Dependence on renal dialysis: Secondary | ICD-10-CM | POA: Diagnosis not present

## 2017-09-11 DIAGNOSIS — N186 End stage renal disease: Secondary | ICD-10-CM | POA: Diagnosis not present

## 2017-09-11 DIAGNOSIS — D509 Iron deficiency anemia, unspecified: Secondary | ICD-10-CM | POA: Diagnosis not present

## 2017-09-11 DIAGNOSIS — D631 Anemia in chronic kidney disease: Secondary | ICD-10-CM | POA: Diagnosis not present

## 2017-09-11 DIAGNOSIS — N2581 Secondary hyperparathyroidism of renal origin: Secondary | ICD-10-CM | POA: Diagnosis not present

## 2017-09-14 DIAGNOSIS — D509 Iron deficiency anemia, unspecified: Secondary | ICD-10-CM | POA: Diagnosis not present

## 2017-09-14 DIAGNOSIS — N2581 Secondary hyperparathyroidism of renal origin: Secondary | ICD-10-CM | POA: Diagnosis not present

## 2017-09-14 DIAGNOSIS — Z992 Dependence on renal dialysis: Secondary | ICD-10-CM | POA: Diagnosis not present

## 2017-09-14 DIAGNOSIS — K2211 Ulcer of esophagus with bleeding: Secondary | ICD-10-CM | POA: Diagnosis not present

## 2017-09-14 DIAGNOSIS — N186 End stage renal disease: Secondary | ICD-10-CM | POA: Diagnosis not present

## 2017-09-14 DIAGNOSIS — D631 Anemia in chronic kidney disease: Secondary | ICD-10-CM | POA: Diagnosis not present

## 2017-09-16 DIAGNOSIS — D509 Iron deficiency anemia, unspecified: Secondary | ICD-10-CM | POA: Diagnosis not present

## 2017-09-16 DIAGNOSIS — N186 End stage renal disease: Secondary | ICD-10-CM | POA: Diagnosis not present

## 2017-09-16 DIAGNOSIS — Z992 Dependence on renal dialysis: Secondary | ICD-10-CM | POA: Diagnosis not present

## 2017-09-16 DIAGNOSIS — D631 Anemia in chronic kidney disease: Secondary | ICD-10-CM | POA: Diagnosis not present

## 2017-09-16 DIAGNOSIS — N2581 Secondary hyperparathyroidism of renal origin: Secondary | ICD-10-CM | POA: Diagnosis not present

## 2017-09-16 DIAGNOSIS — K2211 Ulcer of esophagus with bleeding: Secondary | ICD-10-CM | POA: Diagnosis not present

## 2017-09-17 ENCOUNTER — Ambulatory Visit: Payer: Medicare Other | Admitting: Podiatry

## 2017-09-18 DIAGNOSIS — N186 End stage renal disease: Secondary | ICD-10-CM | POA: Diagnosis not present

## 2017-09-18 DIAGNOSIS — N2581 Secondary hyperparathyroidism of renal origin: Secondary | ICD-10-CM | POA: Diagnosis not present

## 2017-09-18 DIAGNOSIS — D631 Anemia in chronic kidney disease: Secondary | ICD-10-CM | POA: Diagnosis not present

## 2017-09-18 DIAGNOSIS — Z992 Dependence on renal dialysis: Secondary | ICD-10-CM | POA: Diagnosis not present

## 2017-09-18 DIAGNOSIS — E8770 Fluid overload, unspecified: Secondary | ICD-10-CM | POA: Diagnosis not present

## 2017-09-18 DIAGNOSIS — D509 Iron deficiency anemia, unspecified: Secondary | ICD-10-CM | POA: Diagnosis not present

## 2017-09-18 DIAGNOSIS — K2211 Ulcer of esophagus with bleeding: Secondary | ICD-10-CM | POA: Diagnosis not present

## 2017-09-18 DIAGNOSIS — E1129 Type 2 diabetes mellitus with other diabetic kidney complication: Secondary | ICD-10-CM | POA: Diagnosis not present

## 2017-09-21 DIAGNOSIS — E1129 Type 2 diabetes mellitus with other diabetic kidney complication: Secondary | ICD-10-CM | POA: Diagnosis not present

## 2017-09-21 DIAGNOSIS — D631 Anemia in chronic kidney disease: Secondary | ICD-10-CM | POA: Diagnosis not present

## 2017-09-21 DIAGNOSIS — N2581 Secondary hyperparathyroidism of renal origin: Secondary | ICD-10-CM | POA: Diagnosis not present

## 2017-09-21 DIAGNOSIS — N186 End stage renal disease: Secondary | ICD-10-CM | POA: Diagnosis not present

## 2017-09-21 DIAGNOSIS — K2211 Ulcer of esophagus with bleeding: Secondary | ICD-10-CM | POA: Diagnosis not present

## 2017-09-21 DIAGNOSIS — D509 Iron deficiency anemia, unspecified: Secondary | ICD-10-CM | POA: Diagnosis not present

## 2017-09-23 DIAGNOSIS — D631 Anemia in chronic kidney disease: Secondary | ICD-10-CM | POA: Diagnosis not present

## 2017-09-23 DIAGNOSIS — N186 End stage renal disease: Secondary | ICD-10-CM | POA: Diagnosis not present

## 2017-09-23 DIAGNOSIS — N2581 Secondary hyperparathyroidism of renal origin: Secondary | ICD-10-CM | POA: Diagnosis not present

## 2017-09-23 DIAGNOSIS — E1129 Type 2 diabetes mellitus with other diabetic kidney complication: Secondary | ICD-10-CM | POA: Diagnosis not present

## 2017-09-23 DIAGNOSIS — D509 Iron deficiency anemia, unspecified: Secondary | ICD-10-CM | POA: Diagnosis not present

## 2017-09-23 DIAGNOSIS — K2211 Ulcer of esophagus with bleeding: Secondary | ICD-10-CM | POA: Diagnosis not present

## 2017-09-25 DIAGNOSIS — N2581 Secondary hyperparathyroidism of renal origin: Secondary | ICD-10-CM | POA: Diagnosis not present

## 2017-09-25 DIAGNOSIS — D631 Anemia in chronic kidney disease: Secondary | ICD-10-CM | POA: Diagnosis not present

## 2017-09-25 DIAGNOSIS — N186 End stage renal disease: Secondary | ICD-10-CM | POA: Diagnosis not present

## 2017-09-25 DIAGNOSIS — K2211 Ulcer of esophagus with bleeding: Secondary | ICD-10-CM | POA: Diagnosis not present

## 2017-09-25 DIAGNOSIS — E1129 Type 2 diabetes mellitus with other diabetic kidney complication: Secondary | ICD-10-CM | POA: Diagnosis not present

## 2017-09-25 DIAGNOSIS — D509 Iron deficiency anemia, unspecified: Secondary | ICD-10-CM | POA: Diagnosis not present

## 2017-09-28 ENCOUNTER — Encounter (INDEPENDENT_AMBULATORY_CARE_PROVIDER_SITE_OTHER): Payer: Self-pay | Admitting: Orthopaedic Surgery

## 2017-09-28 ENCOUNTER — Other Ambulatory Visit (INDEPENDENT_AMBULATORY_CARE_PROVIDER_SITE_OTHER): Payer: Self-pay

## 2017-09-28 ENCOUNTER — Ambulatory Visit (INDEPENDENT_AMBULATORY_CARE_PROVIDER_SITE_OTHER): Payer: Medicare Other | Admitting: Orthopaedic Surgery

## 2017-09-28 DIAGNOSIS — E1129 Type 2 diabetes mellitus with other diabetic kidney complication: Secondary | ICD-10-CM | POA: Diagnosis not present

## 2017-09-28 DIAGNOSIS — N186 End stage renal disease: Secondary | ICD-10-CM | POA: Diagnosis not present

## 2017-09-28 DIAGNOSIS — M79641 Pain in right hand: Secondary | ICD-10-CM

## 2017-09-28 DIAGNOSIS — D631 Anemia in chronic kidney disease: Secondary | ICD-10-CM | POA: Diagnosis not present

## 2017-09-28 DIAGNOSIS — N2581 Secondary hyperparathyroidism of renal origin: Secondary | ICD-10-CM | POA: Diagnosis not present

## 2017-09-28 DIAGNOSIS — K2211 Ulcer of esophagus with bleeding: Secondary | ICD-10-CM | POA: Diagnosis not present

## 2017-09-28 DIAGNOSIS — M79642 Pain in left hand: Principal | ICD-10-CM

## 2017-09-28 DIAGNOSIS — R200 Anesthesia of skin: Secondary | ICD-10-CM | POA: Diagnosis not present

## 2017-09-28 DIAGNOSIS — D509 Iron deficiency anemia, unspecified: Secondary | ICD-10-CM | POA: Diagnosis not present

## 2017-09-28 NOTE — Progress Notes (Signed)
The patient is someone I am seeing for the first time but  she did see Dr. Paulla Fore in 2017 and saw Benita Stabile PA-C in July of last year.  He will order an MRI of her cervical spine due to bilateral arm pain with numbness in her hands as well as neck pain.  She is someone who is diabetic and is also on dialysis through her right arm.  She says she does get headache pain that wakes her up at night.  Blood glucose never runs above 200 according to her but she does get numbness and tingling both hands and is waking her up again at night quite a bit.  She still reports neck pain as well.  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  On exam both of her hands are weak in terms of grip strength.  Both hands have some global numbness is really difficult to get an idea of the degree of numbness and pain which distributions.  This point I would like to obtain bilateral upper extremity nerve conduction studies to see if this will help shed some light on the source of her neuropathy.  We will see her back after the studies.  All questions concerns were answered and addressed.

## 2017-09-29 DIAGNOSIS — E1129 Type 2 diabetes mellitus with other diabetic kidney complication: Secondary | ICD-10-CM | POA: Diagnosis not present

## 2017-09-29 DIAGNOSIS — D509 Iron deficiency anemia, unspecified: Secondary | ICD-10-CM | POA: Diagnosis not present

## 2017-09-29 DIAGNOSIS — N2581 Secondary hyperparathyroidism of renal origin: Secondary | ICD-10-CM | POA: Diagnosis not present

## 2017-09-29 DIAGNOSIS — N186 End stage renal disease: Secondary | ICD-10-CM | POA: Diagnosis not present

## 2017-09-29 DIAGNOSIS — D631 Anemia in chronic kidney disease: Secondary | ICD-10-CM | POA: Diagnosis not present

## 2017-09-29 DIAGNOSIS — K2211 Ulcer of esophagus with bleeding: Secondary | ICD-10-CM | POA: Diagnosis not present

## 2017-09-30 DIAGNOSIS — D509 Iron deficiency anemia, unspecified: Secondary | ICD-10-CM | POA: Diagnosis not present

## 2017-09-30 DIAGNOSIS — D631 Anemia in chronic kidney disease: Secondary | ICD-10-CM | POA: Diagnosis not present

## 2017-09-30 DIAGNOSIS — K2211 Ulcer of esophagus with bleeding: Secondary | ICD-10-CM | POA: Diagnosis not present

## 2017-09-30 DIAGNOSIS — N186 End stage renal disease: Secondary | ICD-10-CM | POA: Diagnosis not present

## 2017-09-30 DIAGNOSIS — N2581 Secondary hyperparathyroidism of renal origin: Secondary | ICD-10-CM | POA: Diagnosis not present

## 2017-09-30 DIAGNOSIS — E1129 Type 2 diabetes mellitus with other diabetic kidney complication: Secondary | ICD-10-CM | POA: Diagnosis not present

## 2017-10-02 DIAGNOSIS — K2211 Ulcer of esophagus with bleeding: Secondary | ICD-10-CM | POA: Diagnosis not present

## 2017-10-02 DIAGNOSIS — D631 Anemia in chronic kidney disease: Secondary | ICD-10-CM | POA: Diagnosis not present

## 2017-10-02 DIAGNOSIS — D509 Iron deficiency anemia, unspecified: Secondary | ICD-10-CM | POA: Diagnosis not present

## 2017-10-02 DIAGNOSIS — N2581 Secondary hyperparathyroidism of renal origin: Secondary | ICD-10-CM | POA: Diagnosis not present

## 2017-10-02 DIAGNOSIS — N186 End stage renal disease: Secondary | ICD-10-CM | POA: Diagnosis not present

## 2017-10-02 DIAGNOSIS — E1129 Type 2 diabetes mellitus with other diabetic kidney complication: Secondary | ICD-10-CM | POA: Diagnosis not present

## 2017-10-05 DIAGNOSIS — N186 End stage renal disease: Secondary | ICD-10-CM | POA: Diagnosis not present

## 2017-10-05 DIAGNOSIS — D631 Anemia in chronic kidney disease: Secondary | ICD-10-CM | POA: Diagnosis not present

## 2017-10-05 DIAGNOSIS — K2211 Ulcer of esophagus with bleeding: Secondary | ICD-10-CM | POA: Diagnosis not present

## 2017-10-05 DIAGNOSIS — D509 Iron deficiency anemia, unspecified: Secondary | ICD-10-CM | POA: Diagnosis not present

## 2017-10-05 DIAGNOSIS — E1129 Type 2 diabetes mellitus with other diabetic kidney complication: Secondary | ICD-10-CM | POA: Diagnosis not present

## 2017-10-05 DIAGNOSIS — N2581 Secondary hyperparathyroidism of renal origin: Secondary | ICD-10-CM | POA: Diagnosis not present

## 2017-10-06 DIAGNOSIS — M2011 Hallux valgus (acquired), right foot: Secondary | ICD-10-CM | POA: Diagnosis not present

## 2017-10-06 DIAGNOSIS — K922 Gastrointestinal hemorrhage, unspecified: Secondary | ICD-10-CM | POA: Diagnosis not present

## 2017-10-06 DIAGNOSIS — N186 End stage renal disease: Secondary | ICD-10-CM | POA: Diagnosis not present

## 2017-10-06 DIAGNOSIS — F3289 Other specified depressive episodes: Secondary | ICD-10-CM | POA: Diagnosis not present

## 2017-10-06 DIAGNOSIS — Z1389 Encounter for screening for other disorder: Secondary | ICD-10-CM | POA: Diagnosis not present

## 2017-10-06 DIAGNOSIS — H540X55 Blindness right eye category 5, blindness left eye category 5: Secondary | ICD-10-CM | POA: Diagnosis not present

## 2017-10-06 DIAGNOSIS — Z6830 Body mass index (BMI) 30.0-30.9, adult: Secondary | ICD-10-CM | POA: Diagnosis not present

## 2017-10-06 DIAGNOSIS — E1129 Type 2 diabetes mellitus with other diabetic kidney complication: Secondary | ICD-10-CM | POA: Diagnosis not present

## 2017-10-07 DIAGNOSIS — N186 End stage renal disease: Secondary | ICD-10-CM | POA: Diagnosis not present

## 2017-10-07 DIAGNOSIS — N2581 Secondary hyperparathyroidism of renal origin: Secondary | ICD-10-CM | POA: Diagnosis not present

## 2017-10-07 DIAGNOSIS — D509 Iron deficiency anemia, unspecified: Secondary | ICD-10-CM | POA: Diagnosis not present

## 2017-10-07 DIAGNOSIS — K2211 Ulcer of esophagus with bleeding: Secondary | ICD-10-CM | POA: Diagnosis not present

## 2017-10-07 DIAGNOSIS — D631 Anemia in chronic kidney disease: Secondary | ICD-10-CM | POA: Diagnosis not present

## 2017-10-07 DIAGNOSIS — E1129 Type 2 diabetes mellitus with other diabetic kidney complication: Secondary | ICD-10-CM | POA: Diagnosis not present

## 2017-10-09 DIAGNOSIS — D509 Iron deficiency anemia, unspecified: Secondary | ICD-10-CM | POA: Diagnosis not present

## 2017-10-09 DIAGNOSIS — K2211 Ulcer of esophagus with bleeding: Secondary | ICD-10-CM | POA: Diagnosis not present

## 2017-10-09 DIAGNOSIS — D631 Anemia in chronic kidney disease: Secondary | ICD-10-CM | POA: Diagnosis not present

## 2017-10-09 DIAGNOSIS — N186 End stage renal disease: Secondary | ICD-10-CM | POA: Diagnosis not present

## 2017-10-09 DIAGNOSIS — N2581 Secondary hyperparathyroidism of renal origin: Secondary | ICD-10-CM | POA: Diagnosis not present

## 2017-10-09 DIAGNOSIS — E1129 Type 2 diabetes mellitus with other diabetic kidney complication: Secondary | ICD-10-CM | POA: Diagnosis not present

## 2017-10-12 DIAGNOSIS — K2211 Ulcer of esophagus with bleeding: Secondary | ICD-10-CM | POA: Diagnosis not present

## 2017-10-12 DIAGNOSIS — D631 Anemia in chronic kidney disease: Secondary | ICD-10-CM | POA: Diagnosis not present

## 2017-10-12 DIAGNOSIS — E1129 Type 2 diabetes mellitus with other diabetic kidney complication: Secondary | ICD-10-CM | POA: Diagnosis not present

## 2017-10-12 DIAGNOSIS — N186 End stage renal disease: Secondary | ICD-10-CM | POA: Diagnosis not present

## 2017-10-12 DIAGNOSIS — N2581 Secondary hyperparathyroidism of renal origin: Secondary | ICD-10-CM | POA: Diagnosis not present

## 2017-10-12 DIAGNOSIS — D509 Iron deficiency anemia, unspecified: Secondary | ICD-10-CM | POA: Diagnosis not present

## 2017-10-14 DIAGNOSIS — N2581 Secondary hyperparathyroidism of renal origin: Secondary | ICD-10-CM | POA: Diagnosis not present

## 2017-10-14 DIAGNOSIS — D509 Iron deficiency anemia, unspecified: Secondary | ICD-10-CM | POA: Diagnosis not present

## 2017-10-14 DIAGNOSIS — E1129 Type 2 diabetes mellitus with other diabetic kidney complication: Secondary | ICD-10-CM | POA: Diagnosis not present

## 2017-10-14 DIAGNOSIS — N186 End stage renal disease: Secondary | ICD-10-CM | POA: Diagnosis not present

## 2017-10-14 DIAGNOSIS — K2211 Ulcer of esophagus with bleeding: Secondary | ICD-10-CM | POA: Diagnosis not present

## 2017-10-14 DIAGNOSIS — D631 Anemia in chronic kidney disease: Secondary | ICD-10-CM | POA: Diagnosis not present

## 2017-10-16 DIAGNOSIS — D631 Anemia in chronic kidney disease: Secondary | ICD-10-CM | POA: Diagnosis not present

## 2017-10-16 DIAGNOSIS — N2581 Secondary hyperparathyroidism of renal origin: Secondary | ICD-10-CM | POA: Diagnosis not present

## 2017-10-16 DIAGNOSIS — K2211 Ulcer of esophagus with bleeding: Secondary | ICD-10-CM | POA: Diagnosis not present

## 2017-10-16 DIAGNOSIS — N186 End stage renal disease: Secondary | ICD-10-CM | POA: Diagnosis not present

## 2017-10-16 DIAGNOSIS — E1129 Type 2 diabetes mellitus with other diabetic kidney complication: Secondary | ICD-10-CM | POA: Diagnosis not present

## 2017-10-16 DIAGNOSIS — D509 Iron deficiency anemia, unspecified: Secondary | ICD-10-CM | POA: Diagnosis not present

## 2017-10-19 DIAGNOSIS — K2211 Ulcer of esophagus with bleeding: Secondary | ICD-10-CM | POA: Diagnosis not present

## 2017-10-19 DIAGNOSIS — N186 End stage renal disease: Secondary | ICD-10-CM | POA: Diagnosis not present

## 2017-10-19 DIAGNOSIS — D509 Iron deficiency anemia, unspecified: Secondary | ICD-10-CM | POA: Diagnosis not present

## 2017-10-19 DIAGNOSIS — E1129 Type 2 diabetes mellitus with other diabetic kidney complication: Secondary | ICD-10-CM | POA: Diagnosis not present

## 2017-10-19 DIAGNOSIS — N2581 Secondary hyperparathyroidism of renal origin: Secondary | ICD-10-CM | POA: Diagnosis not present

## 2017-10-19 DIAGNOSIS — D631 Anemia in chronic kidney disease: Secondary | ICD-10-CM | POA: Diagnosis not present

## 2017-10-19 DIAGNOSIS — Z992 Dependence on renal dialysis: Secondary | ICD-10-CM | POA: Diagnosis not present

## 2017-10-21 DIAGNOSIS — K2211 Ulcer of esophagus with bleeding: Secondary | ICD-10-CM | POA: Diagnosis not present

## 2017-10-21 DIAGNOSIS — Z992 Dependence on renal dialysis: Secondary | ICD-10-CM | POA: Diagnosis not present

## 2017-10-21 DIAGNOSIS — D509 Iron deficiency anemia, unspecified: Secondary | ICD-10-CM | POA: Diagnosis not present

## 2017-10-21 DIAGNOSIS — N186 End stage renal disease: Secondary | ICD-10-CM | POA: Diagnosis not present

## 2017-10-21 DIAGNOSIS — N2581 Secondary hyperparathyroidism of renal origin: Secondary | ICD-10-CM | POA: Diagnosis not present

## 2017-10-21 DIAGNOSIS — E1129 Type 2 diabetes mellitus with other diabetic kidney complication: Secondary | ICD-10-CM | POA: Diagnosis not present

## 2017-10-23 DIAGNOSIS — K2211 Ulcer of esophagus with bleeding: Secondary | ICD-10-CM | POA: Diagnosis not present

## 2017-10-23 DIAGNOSIS — N2581 Secondary hyperparathyroidism of renal origin: Secondary | ICD-10-CM | POA: Diagnosis not present

## 2017-10-23 DIAGNOSIS — Z992 Dependence on renal dialysis: Secondary | ICD-10-CM | POA: Diagnosis not present

## 2017-10-23 DIAGNOSIS — E1129 Type 2 diabetes mellitus with other diabetic kidney complication: Secondary | ICD-10-CM | POA: Diagnosis not present

## 2017-10-23 DIAGNOSIS — N186 End stage renal disease: Secondary | ICD-10-CM | POA: Diagnosis not present

## 2017-10-23 DIAGNOSIS — D509 Iron deficiency anemia, unspecified: Secondary | ICD-10-CM | POA: Diagnosis not present

## 2017-10-26 DIAGNOSIS — E1129 Type 2 diabetes mellitus with other diabetic kidney complication: Secondary | ICD-10-CM | POA: Diagnosis not present

## 2017-10-26 DIAGNOSIS — Z992 Dependence on renal dialysis: Secondary | ICD-10-CM | POA: Diagnosis not present

## 2017-10-26 DIAGNOSIS — K2211 Ulcer of esophagus with bleeding: Secondary | ICD-10-CM | POA: Diagnosis not present

## 2017-10-26 DIAGNOSIS — N2581 Secondary hyperparathyroidism of renal origin: Secondary | ICD-10-CM | POA: Diagnosis not present

## 2017-10-26 DIAGNOSIS — N186 End stage renal disease: Secondary | ICD-10-CM | POA: Diagnosis not present

## 2017-10-26 DIAGNOSIS — D509 Iron deficiency anemia, unspecified: Secondary | ICD-10-CM | POA: Diagnosis not present

## 2017-10-27 ENCOUNTER — Telehealth: Payer: Self-pay | Admitting: Internal Medicine

## 2017-10-27 NOTE — Telephone Encounter (Signed)
Per [facility], Next available APP appointment is fine.   Patient states that she will callback to schedule

## 2017-10-28 ENCOUNTER — Encounter (HOSPITAL_COMMUNITY): Payer: Self-pay | Admitting: Emergency Medicine

## 2017-10-28 ENCOUNTER — Other Ambulatory Visit: Payer: Self-pay

## 2017-10-28 ENCOUNTER — Emergency Department (HOSPITAL_COMMUNITY)
Admission: EM | Admit: 2017-10-28 | Discharge: 2017-10-28 | Disposition: A | Discharge status: Home / Self Care | Payer: Medicare Other | Attending: Emergency Medicine | Admitting: Emergency Medicine

## 2017-10-28 DIAGNOSIS — Z79899 Other long term (current) drug therapy: Secondary | ICD-10-CM | POA: Insufficient documentation

## 2017-10-28 DIAGNOSIS — E1122 Type 2 diabetes mellitus with diabetic chronic kidney disease: Secondary | ICD-10-CM | POA: Insufficient documentation

## 2017-10-28 DIAGNOSIS — E1129 Type 2 diabetes mellitus with other diabetic kidney complication: Secondary | ICD-10-CM | POA: Diagnosis not present

## 2017-10-28 DIAGNOSIS — Z7982 Long term (current) use of aspirin: Secondary | ICD-10-CM | POA: Insufficient documentation

## 2017-10-28 DIAGNOSIS — Z794 Long term (current) use of insulin: Secondary | ICD-10-CM | POA: Insufficient documentation

## 2017-10-28 DIAGNOSIS — D631 Anemia in chronic kidney disease: Secondary | ICD-10-CM | POA: Insufficient documentation

## 2017-10-28 DIAGNOSIS — K2211 Ulcer of esophagus with bleeding: Secondary | ICD-10-CM | POA: Diagnosis not present

## 2017-10-28 DIAGNOSIS — I5042 Chronic combined systolic (congestive) and diastolic (congestive) heart failure: Secondary | ICD-10-CM | POA: Diagnosis not present

## 2017-10-28 DIAGNOSIS — E1140 Type 2 diabetes mellitus with diabetic neuropathy, unspecified: Secondary | ICD-10-CM | POA: Insufficient documentation

## 2017-10-28 DIAGNOSIS — N186 End stage renal disease: Secondary | ICD-10-CM | POA: Diagnosis not present

## 2017-10-28 DIAGNOSIS — R531 Weakness: Secondary | ICD-10-CM | POA: Diagnosis not present

## 2017-10-28 DIAGNOSIS — R079 Chest pain, unspecified: Secondary | ICD-10-CM | POA: Diagnosis not present

## 2017-10-28 DIAGNOSIS — D509 Iron deficiency anemia, unspecified: Secondary | ICD-10-CM | POA: Diagnosis not present

## 2017-10-28 DIAGNOSIS — Z992 Dependence on renal dialysis: Secondary | ICD-10-CM | POA: Diagnosis not present

## 2017-10-28 DIAGNOSIS — I132 Hypertensive heart and chronic kidney disease with heart failure and with stage 5 chronic kidney disease, or end stage renal disease: Secondary | ICD-10-CM | POA: Insufficient documentation

## 2017-10-28 DIAGNOSIS — I120 Hypertensive chronic kidney disease with stage 5 chronic kidney disease or end stage renal disease: Secondary | ICD-10-CM | POA: Diagnosis not present

## 2017-10-28 DIAGNOSIS — N2581 Secondary hyperparathyroidism of renal origin: Secondary | ICD-10-CM | POA: Diagnosis not present

## 2017-10-28 LAB — COMPREHENSIVE METABOLIC PANEL
ALBUMIN: 3.6 g/dL (ref 3.5–5.0)
ALK PHOS: 94 U/L (ref 38–126)
ALT: 31 U/L (ref 14–54)
ANION GAP: 16 — AB (ref 5–15)
AST: 28 U/L (ref 15–41)
BILIRUBIN TOTAL: 0.7 mg/dL (ref 0.3–1.2)
BUN: 33 mg/dL — ABNORMAL HIGH (ref 6–20)
CALCIUM: 9.4 mg/dL (ref 8.9–10.3)
CO2: 28 mmol/L (ref 22–32)
Chloride: 96 mmol/L — ABNORMAL LOW (ref 101–111)
Creatinine, Ser: 10.17 mg/dL — ABNORMAL HIGH (ref 0.44–1.00)
GFR, EST AFRICAN AMERICAN: 4 mL/min — AB (ref >60)
GFR, EST NON AFRICAN AMERICAN: 4 mL/min — AB (ref >60)
GLUCOSE: 132 mg/dL — AB (ref 65–99)
POTASSIUM: 4.2 mmol/L (ref 3.5–5.1)
Sodium: 140 mmol/L (ref 135–145)
TOTAL PROTEIN: 7.8 g/dL (ref 6.5–8.1)

## 2017-10-28 LAB — CBC WITH DIFFERENTIAL/PLATELET
BASOS ABS: 0 10*3/uL (ref 0.0–0.1)
BASOS PCT: 1 %
Eosinophils Absolute: 0.3 10*3/uL (ref 0.0–0.7)
Eosinophils Relative: 4 %
HEMATOCRIT: 27.6 % — AB (ref 36.0–46.0)
HEMOGLOBIN: 8.7 g/dL — AB (ref 12.0–15.0)
LYMPHS PCT: 31 %
Lymphs Abs: 2 10*3/uL (ref 0.7–4.0)
MCH: 29.7 pg (ref 26.0–34.0)
MCHC: 31.5 g/dL (ref 30.0–36.0)
MCV: 94.2 fL (ref 78.0–100.0)
MONO ABS: 0.5 10*3/uL (ref 0.1–1.0)
Monocytes Relative: 8 %
NEUTROS ABS: 3.6 10*3/uL (ref 1.7–7.7)
NEUTROS PCT: 56 %
Platelets: 300 10*3/uL (ref 150–400)
RBC: 2.93 MIL/uL — AB (ref 3.87–5.11)
RDW: 19.9 % — AB (ref 11.5–15.5)
WBC: 6.4 10*3/uL (ref 4.0–10.5)

## 2017-10-28 LAB — CBG MONITORING, ED: Glucose-Capillary: 122 mg/dL — ABNORMAL HIGH (ref 65–99)

## 2017-10-28 NOTE — ED Triage Notes (Signed)
Pt to ER sent from dialysis for low hemoglobin and weakness, nausea, and dizziness. Pt is dialysis patient, reports last treatment was Monday, but didn't receive full treatment. Pt in NAD. Is visually impaired.

## 2017-10-28 NOTE — ED Provider Notes (Addendum)
[facility] EMERGENCY DEPARTMENT Provider Note   CSN: [id number] Arrival date & time: 10/28/17  0802     History   Chief Complaint Chief Complaint  Patient presents with  . Nausea  . Dizziness  . Anemia    HPI [name] is a 67 y.o. female.  HPI  67 year old female presents with concern for anemia.  She has end-stage renal disease on dialysis and has had problems with anemia in the past.  She was admitted in December and had a blood transfusion.  She states that on Friday, she had blood drawn.  2 days ago on Monday she was told by her nephrologist that her hemoglobin was around 7.  She is not sure the exact number.  She states that she has been having generalized weakness for a few months that she attributes to anemia and is concerned that it is worsening.  There is no focal weakness.  She has had a lot of chronic symptoms such as vomiting with dialysis, abdominal pain for over 1 month, and chest wall pain on both sides for several weeks.  No new symptoms today or in the last 2 days.  She denies any known blood when vomiting or in her stools but is blind in her family has not seen any.  No new cough but has had a chronic cough for months.  Past Medical History:  Diagnosis Date  . Anemia   . Anxiety   . Arthritis    "joints" (06/15/2013)  . Blood transfusion without reported diagnosis   . Breast cancer ([place])    left  . CHF (congestive heart failure) ([place])   . ESRD (end stage renal disease) ([place])    "suppose to start dialysis today" (06/15/2013)  . GERD (gastroesophageal reflux disease)   . Hypertension   . Myalgia 12/31/2011  . Neuropathy 12/31/2011  . Shortness of breath    "lately it's been all the time" (06/15/2013)  . Type II diabetes mellitus [facility])     Patient Active Problem List   Diagnosis Date Noted  . Bilateral hand numbness 09/28/2017  . Esophageal ulcer with bleeding   . Acute blood loss anemia   . Non-intractable cyclical vomiting  with nausea   . Generalized abdominal pain   . Symptomatic anemia 07/06/2017  . Positive occult stool blood test 07/06/2017  . ESRD (end stage renal disease) ([place]) 07/06/2017  . Type II diabetes mellitus ([place]) 07/06/2017  . Hypertension 07/06/2017  . Chronic combined systolic and diastolic CHF (congestive heart failure) ([place]) 07/06/2017  . Complicated migraine 38/04/1750  . ESRD on dialysis ([place]) 06/04/2017  . Fluid overload 06/04/2017  . Blindness of both eyes 06/04/2017  . Glaucoma 06/04/2017  . Pulmonary edema 06/04/2017  . Acute on chronic respiratory failure with hypoxia ([place]) 10/27/2016  . Essential hypertension 10/27/2016  . GERD (gastroesophageal reflux disease) 10/27/2016  . Poor venous access 02/08/2015  . End stage renal disease ([place]) 07/08/2013  . Anemia 06/15/2013  . Acute on chronic combined systolic and diastolic CHF (congestive heart failure) ([place]) 06/15/2013  . Type II diabetes mellitus with renal manifestations ([place]) 06/15/2013  . Chronic kidney disease (CKD), stage IV (severe) ([place]) 06/03/2013  . Myalgia 12/31/2011  . Neuropathy 12/31/2011  . Breast cancer of upper-inner quadrant of left female breast ([place]) 06/27/2011    Past Surgical History:  Procedure Laterality Date  . ABDOMINAL HYSTERECTOMY     partial  . AV FISTULA PLACEMENT Right 06/06/2013   Procedure: ARTERIOVENOUS (AV)  FISTULA CREATION-RIGHT BRACHIAL CEPHALIC;  Surgeon: Conrad Westover, MD;  Location: [facility];  Service: Vascular;  Laterality: Right;  . BREAST BIOPSY Left   . BREAST LUMPECTOMY Left    "and took out some lymph nodes" (06/15/2013)  . CATARACT EXTRACTION W/ ANTERIOR VITRECTOMY Bilateral   . CESAREAN SECTION  1980  . ESOPHAGOGASTRODUODENOSCOPY N/A 07/08/2017   Procedure: ESOPHAGOGASTRODUODENOSCOPY (EGD);  Surgeon: [name], MD;  Location: [facility] ENDOSCOPY;  Service: Endoscopy;  Laterality: N/A;  . EYE SURGERY Bilateral    laser surgery  . PARS PLANA VITRECTOMY Right 05/05/2017    Procedure: PARS PLANA VITRECTOMY WITH 25 GAUGE; PARTIAL REMOVAL OF OIL; INFERIOR PERIPHERAL IRIDECTOMY, REFORM ANTERIOR CHAMBER RIGHT EYE;  Surgeon: [name], MD;  Location: [facility];  Service: Ophthalmology;  Laterality: Right;  . REFRACTIVE SURGERY Bilateral   . REMOVAL OF A DIALYSIS CATHETER Right 06/06/2013   Procedure: REMOVAL OF RIGHT MEDIPORT;  Surgeon: Conrad Harrisville, MD;  Location: [facility];  Service: Vascular;  Laterality: Right;  . TONSILLECTOMY       OB History   None      Home Medications    Prior to Admission medications   Medication Sig Start Date End Date Taking? Authorizing Provider  albuterol (PROVENTIL) (2.5 MG/3ML) 0.083% nebulizer solution Take 3 mLs (2.5 mg total) by nebulization every 6 (six) hours as needed for wheezing or shortness of breath. 10/30/16   Theodis Blaze, MD  amLODipine (NORVASC) 10 MG tablet Take 10 mg at bedtime by mouth. 06/02/17   [provider]  aspirin 81 MG chewable tablet Chew 81 mg by mouth daily with lunch.     [provider]  atropine 1 % ophthalmic solution Place 1 drop into the right eye 2 (two) times daily. 07/04/17   [provider]  B Complex-C-Folic Acid (RENA-VITE RX) 1 MG TABS Take 1 tablet by mouth daily with lunch.  09/29/16   [provider]  benzonatate (TESSALON PERLES) 100 MG capsule Take 1 capsule (100 mg total) by mouth 3 (three) times daily as needed for cough. Patient not taking: Reported on 05/05/2017 08/28/16   Mackuen, Courteney Lyn, MD  carvedilol (COREG) 12.5 MG tablet Take 12.5 mg by mouth 2 (two) times daily with a meal.    [provider]  cloNIDine (CATAPRES) 0.1 MG tablet Take 0.1 mg 2 (two) times daily by mouth. with lunch and at bedtime    [provider]  darbepoetin (ARANESP) 200 MCG/0.4ML SOLN injection Inject 0.4 mLs (200 mcg total) into the vein every Wednesday with hemodialysis. Patient not taking: Reported on 07/06/2017 06/22/13   Geradine Girt, DO    docusate sodium (COLACE) 100 MG capsule Take 100 mg by mouth daily.    [provider]  doxercalciferol (HECTOROL) 4 MCG/2ML injection Inject 0.5 mLs (1 mcg total) into the vein every Monday, Wednesday, and Friday with hemodialysis. Patient not taking: Reported on 07/06/2017 06/18/13   Geradine Girt, DO  fluticasone [facility]) 50 MCG/ACT nasal spray Place 1-2 sprays into both nostrils daily as needed for allergies or rhinitis.     [provider]  insulin aspart (NOVOLOG FLEXPEN) 100 UNIT/ML FlexPen Inject 10-16 Units into the skin See admin instructions. Sliding scale     [provider]  lanthanum (FOSRENOL) 1000 MG chewable tablet Chew 2,000 mg by mouth 3 (three) times daily with meals.     [provider]  lisinopril (PRINIVIL,ZESTRIL) 40 MG tablet Take 40 mg by mouth  at bedtime.     [provider]  pantoprazole (PROTONIX) 40 MG tablet Take 1 tablet (40 mg total) by mouth 2 (two) times daily. Take 1 tablet 2 times daily for 14 days and then 1 tablet daily 07/13/17   Alma Friendly, MD  prednisoLONE acetate (PRED FORTE) 1 % ophthalmic suspension Place 1 drop into the right eye 4 (four) times daily. 04/27/17   [provider]  sennosides (SENOKOT) 8.8 MG/5ML syrup Take 5 mLs by mouth 2 (two) times daily. Patient not taking: Reported on 05/05/2017 10/30/16   Theodis Blaze, MD    Family History Family History  Problem Relation Age of Onset  . Diabetes Mother   . Hyperlipidemia Mother   . Hypertension Mother   . Hypertension Father   . Diabetes Sister   . Diabetes Brother   . Hypertension Brother   . Heart attack Brother   . Kidney disease Brother   . Colon cancer Neg Hx   . Colon polyps Neg Hx   . Esophageal cancer Neg Hx   . Gallbladder disease Neg Hx     Social History Social History   Tobacco Use  . Smoking status: Never Smoker  . Smokeless tobacco: Never Used  Substance Use Topics  . Alcohol use: No  . Drug use:  No     Allergies   Oxycodone; Latex; and Vicodin [hydrocodone-acetaminophen]   Review of Systems Review of Systems  Constitutional: Positive for fatigue.  Respiratory: Positive for cough. Negative for shortness of breath.   Cardiovascular: Positive for chest pain.  Gastrointestinal: Positive for abdominal pain. Negative for blood in stool.  Neurological: Positive for weakness.  All other systems reviewed and are negative.    Physical Exam Updated Vital Signs BP (!) 161/67 (BP Location: Left Arm)   Pulse 73   Temp 98.3 F (36.8 C) (Oral)   Resp 15   SpO2 92%   Physical Exam  Constitutional: She is oriented to person, place, and time. She appears well-developed and well-nourished. No distress.  HENT:  Head: Normocephalic and atraumatic.  Right Ear: External ear normal.  Left Ear: External ear normal.  Nose: Nose normal.  Eyes: Right eye exhibits no discharge. Left eye exhibits no discharge.  Eye shield over right eye  Cardiovascular: Normal rate and regular rhythm.  Murmur heard. Pulmonary/Chest: Effort normal and breath sounds normal. She exhibits tenderness (bilateral, anterior).  Abdominal: Soft. She exhibits no distension. There is no tenderness.  Neurological: She is alert and oriented to person, place, and time.  5/5 strength in all 4 extremities. Normal gross sensation  Skin: Skin is warm and dry. She is not diaphoretic.  Nursing note and vitals reviewed.    ED Treatments / Results  Labs (all labs ordered are listed, but only abnormal results are displayed) Labs Reviewed  COMPREHENSIVE METABOLIC PANEL - Abnormal; Notable for the following components:      Result Value   Chloride 96 (*)    Glucose, Bld 132 (*)    BUN 33 (*)    Creatinine, Ser 10.17 (*)    GFR calc non Af Amer 4 (*)    GFR calc Af Amer 4 (*)    Anion gap 16 (*)    All other components within normal limits  CBC WITH DIFFERENTIAL/PLATELET - Abnormal; Notable for the following components:    RBC 2.93 (*)    Hemoglobin 8.7 (*)    HCT 27.6 (*)    RDW 19.9 (*)  All other components within normal limits  CBG MONITORING, ED - Abnormal; Notable for the following components:   Glucose-Capillary 122 (*)    All other components within normal limits    EKG EKG Interpretation  Date/Time:  Wednesday October 28 2017 08:16:57 EDT Ventricular Rate:  72 PR Interval:  136 QRS Duration: 92 QT Interval:  404 QTC Calculation: 442 R Axis:   21 Text Interpretation:  Normal sinus rhythm Left ventricular hypertrophy with repolarization abnormality Abnormal ECG no significant change since Dec 2018 Confirmed by Sherwood Gambler [id number]) on 10/28/2017 10:00:04 AM   Radiology No results found.  Procedures Procedures (including critical care time)  Medications Ordered in ED Medications - No data to display   Initial Impression / Assessment and Plan / ED Course  I have reviewed the triage vital signs and the nursing notes.  Pertinent labs & imaging results that were available during my care of the patient were reviewed by me and considered in my medical decision making (see chart for details).     Patient's hemoglobin is 8.7.  This is minimally changed from 9.1 in December.  Her vital signs are unremarkable besides moderate hypertension.  She did not have a troponin drawn and I offered to draw one given the chest pain but ACS seems less likely given 2 weeks of chest wall pain.  However her chest x-ray but she declines stating she would like to go now that she knows her hemoglobin is okay.  There is no focal weakness.  My suspicion for an acute emergent condition is low.  Today is her day for dialysis and I discussed she needs to call her dialysis center to try and get dialysis today or tomorrow.  She does not appear to need emergent dialysis.  She has no hypoxia or increased work of breathing to suggest fluid overload.  Discharge with return precautions.  Final Clinical Impressions(s) / ED  Diagnoses   Final diagnoses:  Generalized weakness  Anemia due to chronic kidney disease, on chronic dialysis [facility])    ED Discharge Orders    None       Sherwood Gambler, MD 10/28/17 1136    Sherwood Gambler, MD 10/28/17 1152

## 2017-10-29 ENCOUNTER — Encounter (INDEPENDENT_AMBULATORY_CARE_PROVIDER_SITE_OTHER): Payer: Self-pay | Admitting: Physical Medicine and Rehabilitation

## 2017-10-29 ENCOUNTER — Ambulatory Visit (INDEPENDENT_AMBULATORY_CARE_PROVIDER_SITE_OTHER): Payer: Medicare Other | Admitting: Physical Medicine and Rehabilitation

## 2017-10-29 DIAGNOSIS — R202 Paresthesia of skin: Secondary | ICD-10-CM | POA: Diagnosis not present

## 2017-10-29 DIAGNOSIS — R531 Weakness: Secondary | ICD-10-CM

## 2017-10-29 NOTE — Progress Notes (Signed)
[name] - 67 y.o. female MRN [id number]  Date of birth: 1950-08-27  Office Visit Note: Visit Date: 10/29/2017 PCP: Prince Solian, MD Referred by: Prince Solian, MD  Subjective: Chief Complaint  Patient presents with  . Right Shoulder - Pain  . Left Shoulder - Pain  . Right Hand - Numbness, Pain  . Left Hand - Pain, Numbness  . Right Arm - Pain  . Left Arm - Pain   HPI: [name] is a 67 year old right-hand-dominant female with type II diabetes and end-stage renal disease on dialysis 3 days a week.  She is complicated by being blind as well.  She comes in today at the request of Dr. Ninfa Linden for electrodiagnostic study of both upper limbs.  She has an MRI of the cervical spine which showed motion artifact but may be stenosis at C5-6 and she is getting numbness and tingling in both hands and pain in both shoulders.  The pain in the hands is somewhat nondermatomal and she feels like it is her whole hand.  This is been going on chronically for some time and worsening.  She reports over the last month of the left hand is really become much worse than the right.  Warm water and lying down can make her symptoms better.  She is having difficulty buttoning objects and tying things and using her hands.  She has had no prior electrodiagnostic studies that were aware of and that she is aware of.  She does have a diagnosis of neuropathy of the lower extremities.   ROS Otherwise per HPI.  Assessment & Plan: Visit Diagnoses:  1. Paresthesia of skin   2. Weakness     Plan: No additional findings.  Impression: The above electrodiagnostic study is ABNORMAL and reveals evidence of:  1.  A severe bilateral median nerve entrapment at the wrist (carpal tunnel syndrome) affecting sensory and motor components. The lesion is characterized by sensory and motor demyelination with evidence of significant axonal injury.   2. 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   3. 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   There is no significant electrodiagnostic evidence of any other focal nerve entrapment or brachial plexopathy.    Recommendations: 1.  Follow-up with referring physician. 2.  Continue current management of symptoms. 3.  Suggest surgical evaluation.    Meds & Orders: No orders of the defined types were placed in this encounter.   Orders Placed This Encounter  Procedures  . NCV with EMG (electromyography)    Follow-up: Return for Dr. Ninfa Linden.   Procedures: No procedures performed  EMG & NCV Findings: Evaluation of the left median motor and the right median motor nerves showed prolonged distal onset latency (L7.8, R5.5 ms), reduced amplitude (L2.5, R0.1 mV), and decreased conduction velocity (Elbow-Wrist, L39, R28 m/s).  The left ulnar motor nerve showed decreased conduction velocity (A Elbow-B Elbow, 36 m/s).  The left median (across palm) sensory nerve showed no response (Palm), prolonged distal peak latency (4.9 ms), and reduced amplitude (6.2 V).  The right median (across palm) sensory nerve showed prolonged distal peak latency (Wrist, 6.4 ms), reduced amplitude (8.1 V), and prolonged distal peak latency (Palm, 4.1 ms).  The left ulnar sensory nerve showed prolonged distal peak latency (4.0 ms), reduced amplitude (9.4 V), and decreased conduction velocity (Wrist-5th Digit, 35 m/s).  The right ulnar sensory nerve showed reduced amplitude (5.8 V).  All remaining  nerves (as indicated in the following tables) were within normal limits.  Left vs. Right side comparison data for the median motor nerve indicates abnormal L-R latency difference (2.3 ms), abnormal L-R amplitude difference (96.0 %), and abnormal L-R velocity difference (Elbow-Wrist, 11 m/s).  The ulnar motor nerve indicates abnormal L-R velocity difference (A Elbow-B Elbow, 20 m/s).  All remaining left vs.  right side differences were within normal limits.    Needle evaluation of the right abductor pollicis brevis muscle showed decreased insertional activity, increased spontaneous activity, decreased motor unit amplitude, and diminished recruitment.  All remaining muscles (as indicated in the following table) showed no evidence of electrical instability.    Impression: The above electrodiagnostic study is ABNORMAL and reveals evidence of:  1.  A severe bilateral median nerve entrapment at the wrist (carpal tunnel syndrome) affecting sensory and motor components. The lesion is characterized by sensory and motor demyelination with evidence of significant axonal injury.   2. 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   3. 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   There is no significant electrodiagnostic evidence of any other focal nerve entrapment or brachial plexopathy.    Recommendations: 1.  Follow-up with referring physician. 2.  Continue current management of symptoms. 3.  Suggest surgical evaluation.  Nerve Conduction Studies Anti Sensory Summary Table   Stim Site NR Peak (ms) Norm Peak (ms) P-T Amp (V) Norm P-T Amp Site1 Site2 Delta-P (ms) Dist (cm) Vel (m/s) Norm Vel (m/s)  Left Median Acr Palm Anti Sensory (2nd Digit)  33.8C  Wrist    *4.9 <3.6 *6.2 >10 Wrist Palm  0.0    Palm *NR  <2.0          Right Median Acr Palm Anti Sensory (2nd Digit)  32.7C  Wrist    *6.4 <3.6 *8.1 >10 Wrist Palm 2.3 0.0    Palm    *4.1 <2.0 2.5         Left Radial Anti Sensory (Base 1st Digit)  34.3C  Wrist    2.3 <3.1 20.3  Wrist Base 1st Digit 2.3 0.0    Right Radial Anti Sensory (Base 1st Digit)  33.2C  Wrist    2.2 <3.1 15.5  Wrist Base 1st Digit 2.2 0.0    Left Ulnar Anti Sensory (5th Digit)  34.1C  Wrist    *4.0 <3.7 *9.4 >15.0 Wrist 5th Digit 4.0 14.0 *35 >38  Right Ulnar Anti Sensory  (5th Digit)  33.1C  Wrist    3.7 <3.7 *5.8 >15.0 Wrist 5th Digit 3.7 14.0 38 >38   Motor Summary Table   Stim Site NR Onset (ms) Norm Onset (ms) O-P Amp (mV) Norm O-P Amp Site1 Site2 Delta-0 (ms) Dist (cm) Vel (m/s) Norm Vel (m/s)  Left Median Motor (Abd Poll Brev)  34.3C  Wrist    *7.8 <4.2 *2.5 >5 Elbow Wrist 5.9 23.2 *39 >50  Elbow    13.7  2.3         Right Median Motor (Abd Poll Brev)  33.4C  Wrist    *5.5 <4.2 *0.1 >5 Elbow Wrist 8.5 24.0 *28 >50  Elbow    14.0  0.6         Left Ulnar Motor (Abd Dig Min)  34.4C  Wrist    3.4 <4.2 3.1 >3 B Elbow Wrist 4.1 22.0 54 >53  B Elbow    7.5  1.9  A Elbow B  Elbow 2.5 9.0 *36 >53  A Elbow    10.0  1.6         Right Ulnar Motor (Abd Dig Min)  33.6C  Wrist    3.5 <4.2 3.3 >3 B Elbow Wrist 3.9 22.0 56 >53  B Elbow    7.4  3.2  A Elbow B Elbow 1.7 9.5 56 >53  A Elbow    9.1  2.7          EMG   Side Muscle Nerve Root Ins Act Fibs Psw Amp Dur Poly Recrt Int Fraser Din Comment  Right Abd Poll Brev Median C8-T1 *Decr *3+ *3+ *Decr Nml 0 *Reduced Nml rare muap  Right 1stDorInt Ulnar C8-T1 Nml Nml Nml Nml Nml 0 Nml Nml   Right PronatorTeres Median C6-7 Nml Nml Nml Nml Nml 0 Nml Nml   Right Biceps Musculocut C5-6 Nml Nml Nml Nml Nml 0 Nml Nml   Right Deltoid Axillary C5-6 Nml Nml Nml Nml Nml 0 Nml Nml   Left Abd Poll Brev Median C8-T1 Nml Nml Nml Nml Nml 0 Nml Nml   Left 1stDorInt Ulnar C8-T1 Nml Nml Nml Nml Nml 0 Nml Nml   Left PronatorTeres Median C6-7 Nml Nml Nml Nml Nml 0 Nml Nml   Left Biceps Musculocut C5-6 Nml Nml Nml Nml Nml 0 Nml Nml   Left Deltoid Axillary C5-6 Nml Nml Nml Nml Nml 0 Nml Nml     Nerve Conduction Studies Anti Sensory Left/Right Comparison   Stim Site L Lat (ms) R Lat (ms) L-R Lat (ms) L Amp (V) R Amp (V) L-R Amp (%) Site1 Site2 L Vel (m/s) R Vel (m/s) L-R Vel (m/s)  Median Acr Palm Anti Sensory (2nd Digit)  33.8C  Wrist *4.9 *6.4 1.5 *6.2 *8.1 23.5 Wrist Palm     Palm  *4.1   2.5        Radial Anti Sensory (Base  1st Digit)  34.3C  Wrist 2.3 2.2 0.1 20.3 15.5 23.6 Wrist Base 1st Digit     Ulnar Anti Sensory (5th Digit)  34.1C  Wrist *4.0 3.7 0.3 *9.4 *5.8 38.3 Wrist 5th Digit *35 38 3   Motor Left/Right Comparison   Stim Site L Lat (ms) R Lat (ms) L-R Lat (ms) L Amp (mV) R Amp (mV) L-R Amp (%) Site1 Site2 L Vel (m/s) R Vel (m/s) L-R Vel (m/s)  Median Motor (Abd Poll Brev)  34.3C  Wrist *7.8 *5.5 *2.3 *2.5 *0.1 *96.0 Elbow Wrist *39 *28 *11  Elbow 13.7 14.0 0.3 2.3 0.6 73.9       Ulnar Motor (Abd Dig Min)  34.4C  Wrist 3.4 3.5 0.1 3.1 3.3 6.1 B Elbow Wrist 54 56 2  B Elbow 7.5 7.4 0.1 1.9 3.2 40.6 A Elbow B Elbow *36 56 *20  A Elbow 10.0 9.1 0.9 1.6 2.7 40.7          Waveforms:                     Clinical History: MRI CERVICAL SPINE WITHOUT CONTRAST  TECHNIQUE: Multiplanar, multisequence MR imaging of the cervical spine was performed. No intravenous contrast was administered.  COMPARISON:  None.  FINDINGS: The examination is severely motion degraded. This decreases sensitivity and specificity.  Alignment: Normal  Vertebrae: No focal marrow abnormality.  No compression fracture.  Cord: No obvious abnormality, but assessment of the cord is limited by motion.  Posterior Fossa, vertebral arteries, paraspinal tissues: No focal abnormality  Disc  levels:  Assessment of the disc levels is limited by motion. There are small disc herniations at C2-C3, C5-6 and C6-C7 with mild-to-moderate spinal canal stenosis, worst at the C5-6 level  IMPRESSION: 1. Severely motion degraded examination. 2. Suspected moderate spinal canal stenosis at C5-C6 due to disc herniation. 3. Small herniations at C2-3 and C6-7 with probable mild spinal canal stenosis.   Electronically Signed   By: Ulyses Jarred M.D.   On: 01/23/2017 04:22   She reports that she has never smoked. She has never used smokeless tobacco.  Recent Labs    06/05/17 0322  HGBA1C 7.3*    Objective:   VS:  HT:    WT:   BMI:     BP:   HR: bpm  TEMP: ( )  RESP:  Physical Exam  Musculoskeletal:  Inspection reveals atrophy of the bilateral APB right worse than left and atrophy of the left FDI but normal hand intrinsics.  She has an extension deformity of the left index finger. There is no swelling, color changes, allodynia or dystrophic changes. There is 5 out of 5 strength in the bilateral wrist extension, finger abduction and long finger flexion.  She has weakness with thumb abduction right more than left.  There is decreased sensation in the median nerve distribution on the right compared to left.  There is a fistula in the right arm. There is a negative Hoffmann's test bilaterally.    Ortho Exam Imaging: No results found.  Past Medical/Family/Surgical/Social History: Medications & Allergies reviewed per EMR, new medications updated. Patient Active Problem List   Diagnosis Date Noted  . Bilateral hand numbness 09/28/2017  . Esophageal ulcer with bleeding   . Acute blood loss anemia   . Non-intractable cyclical vomiting with nausea   . Generalized abdominal pain   . Symptomatic anemia 07/06/2017  . Positive occult stool blood test 07/06/2017  . ESRD (end stage renal disease) ([place]) 07/06/2017  . Type II diabetes mellitus ([place]) 07/06/2017  . Hypertension 07/06/2017  . Chronic combined systolic and diastolic CHF (congestive heart failure) ([place]) 07/06/2017  . Complicated migraine 27/74/1287  . ESRD on dialysis ([place]) 06/04/2017  . Fluid overload 06/04/2017  . Blindness of both eyes 06/04/2017  . Glaucoma 06/04/2017  . Pulmonary edema 06/04/2017  . Acute on chronic respiratory failure with hypoxia ([place]) 10/27/2016  . Essential hypertension 10/27/2016  . GERD (gastroesophageal reflux disease) 10/27/2016  . Poor venous access 02/08/2015  . End stage renal disease ([place]) 07/08/2013  . Anemia 06/15/2013  . Acute on chronic combined systolic and diastolic CHF (congestive heart  failure) ([place]) 06/15/2013  . Type II diabetes mellitus with renal manifestations ([place]) 06/15/2013  . Chronic kidney disease (CKD), stage IV (severe) ([place]) 06/03/2013  . Myalgia 12/31/2011  . Neuropathy 12/31/2011  . Breast cancer of upper-inner quadrant of left female breast ([place]) 06/27/2011   Past Medical History:  Diagnosis Date  . Anemia   . Anxiety   . Arthritis    "joints" (06/15/2013)  . Blood transfusion without reported diagnosis   . Breast cancer ([place])    left  . CHF (congestive heart failure) ([place])   . ESRD (end stage renal disease) ([place])    "suppose to start dialysis today" (06/15/2013)  . GERD (gastroesophageal reflux disease)   . Hypertension   . Myalgia 12/31/2011  . Neuropathy 12/31/2011  . Shortness of breath    "lately it's been all the time" (06/15/2013)  . Type II diabetes mellitus ([place])  Family History  Problem Relation Age of Onset  . Diabetes Mother   . Hyperlipidemia Mother   . Hypertension Mother   . Hypertension Father   . Diabetes Sister   . Diabetes Brother   . Hypertension Brother   . Heart attack Brother   . Kidney disease Brother   . Colon cancer Neg Hx   . Colon polyps Neg Hx   . Esophageal cancer Neg Hx   . Gallbladder disease Neg Hx    Past Surgical History:  Procedure Laterality Date  . ABDOMINAL HYSTERECTOMY     partial  . AV FISTULA PLACEMENT Right 06/06/2013   Procedure: ARTERIOVENOUS (AV) FISTULA CREATION-RIGHT BRACHIAL CEPHALIC;  Surgeon: Conrad Mayville, MD;  Location: [facility];  Service: Vascular;  Laterality: Right;  . BREAST BIOPSY Left   . BREAST LUMPECTOMY Left    "and took out some lymph nodes" (06/15/2013)  . CATARACT EXTRACTION W/ ANTERIOR VITRECTOMY Bilateral   . CESAREAN SECTION  1980  . ESOPHAGOGASTRODUODENOSCOPY N/A 07/08/2017   Procedure: ESOPHAGOGASTRODUODENOSCOPY (EGD);  Surgeon: [name], MD;  Location[facility] ENDOSCOPY;  Service: Endoscopy;  Laterality: N/A;  . EYE SURGERY Bilateral    laser surgery  .  PARS PLANA VITRECTOMY Right 05/05/2017   Procedure: PARS PLANA VITRECTOMY WITH 25 GAUGE; PARTIAL REMOVAL OF OIL; INFERIOR PERIPHERAL IRIDECTOMY, REFORM ANTERIOR CHAMBER RIGHT EYE;  Surgeon: [name], MD;  Location: [facility];  Service: Ophthalmology;  Laterality: Right;  . REFRACTIVE SURGERY Bilateral   . REMOVAL OF A DIALYSIS CATHETER Right 06/06/2013   Procedure: REMOVAL OF RIGHT MEDIPORT;  Surgeon: Conrad [place], MD;  Location: [facility];  Service: Vascular;  Laterality: Right;  . TONSILLECTOMY     Social History   Occupational History  . Occupation: Diabled  Tobacco Use  . Smoking status: Never Smoker  . Smokeless tobacco: Never Used  Substance and Sexual Activity  . Alcohol use: No  . Drug use: No  . Sexual activity: Never

## 2017-10-29 NOTE — Progress Notes (Signed)
 .  Numeric Pain Rating Scale and Functional Assessment Average Pain 8   In the last MONTH (on 0-10 scale) has pain interfered with the following?  1. General activity like being  able to carry out your everyday physical activities such as walking, climbing stairs, carrying groceries, or moving a chair?  Rating(3)   +Driver, -BT, -Dye Allergies.  

## 2017-10-30 DIAGNOSIS — E1129 Type 2 diabetes mellitus with other diabetic kidney complication: Secondary | ICD-10-CM | POA: Diagnosis not present

## 2017-10-30 DIAGNOSIS — D509 Iron deficiency anemia, unspecified: Secondary | ICD-10-CM | POA: Diagnosis not present

## 2017-10-30 DIAGNOSIS — N186 End stage renal disease: Secondary | ICD-10-CM | POA: Diagnosis not present

## 2017-10-30 DIAGNOSIS — N2581 Secondary hyperparathyroidism of renal origin: Secondary | ICD-10-CM | POA: Diagnosis not present

## 2017-10-30 DIAGNOSIS — K2211 Ulcer of esophagus with bleeding: Secondary | ICD-10-CM | POA: Diagnosis not present

## 2017-10-30 DIAGNOSIS — Z992 Dependence on renal dialysis: Secondary | ICD-10-CM | POA: Diagnosis not present

## 2017-11-02 ENCOUNTER — Ambulatory Visit (INDEPENDENT_AMBULATORY_CARE_PROVIDER_SITE_OTHER): Payer: Medicare Other | Admitting: Orthopaedic Surgery

## 2017-11-02 DIAGNOSIS — N186 End stage renal disease: Secondary | ICD-10-CM | POA: Diagnosis not present

## 2017-11-02 DIAGNOSIS — D509 Iron deficiency anemia, unspecified: Secondary | ICD-10-CM | POA: Diagnosis not present

## 2017-11-02 DIAGNOSIS — N2581 Secondary hyperparathyroidism of renal origin: Secondary | ICD-10-CM | POA: Diagnosis not present

## 2017-11-02 DIAGNOSIS — E1129 Type 2 diabetes mellitus with other diabetic kidney complication: Secondary | ICD-10-CM | POA: Diagnosis not present

## 2017-11-02 DIAGNOSIS — K2211 Ulcer of esophagus with bleeding: Secondary | ICD-10-CM | POA: Diagnosis not present

## 2017-11-02 DIAGNOSIS — Z992 Dependence on renal dialysis: Secondary | ICD-10-CM | POA: Diagnosis not present

## 2017-11-02 NOTE — Procedures (Signed)
EMG & NCV Findings: Evaluation of the left median motor and the right median motor nerves showed prolonged distal onset latency (L7.8, R5.5 ms), reduced amplitude (L2.5, R0.1 mV), and decreased conduction velocity (Elbow-Wrist, L39, R28 m/s).  The left ulnar motor nerve showed decreased conduction velocity (A Elbow-B Elbow, 36 m/s).  The left median (across palm) sensory nerve showed no response (Palm), prolonged distal peak latency (4.9 ms), and reduced amplitude (6.2 V).  The right median (across palm) sensory nerve showed prolonged distal peak latency (Wrist, 6.4 ms), reduced amplitude (8.1 V), and prolonged distal peak latency (Palm, 4.1 ms).  The left ulnar sensory nerve showed prolonged distal peak latency (4.0 ms), reduced amplitude (9.4 V), and decreased conduction velocity (Wrist-5th Digit, 35 m/s).  The right ulnar sensory nerve showed reduced amplitude (5.8 V).  All remaining nerves (as indicated in the following tables) were within normal limits.  Left vs. Right side comparison data for the median motor nerve indicates abnormal L-R latency difference (2.3 ms), abnormal L-R amplitude difference (96.0 %), and abnormal L-R velocity difference (Elbow-Wrist, 11 m/s).  The ulnar motor nerve indicates abnormal L-R velocity difference (A Elbow-B Elbow, 20 m/s).  All remaining left vs. right side differences were within normal limits.    Needle evaluation of the right abductor pollicis brevis muscle showed decreased insertional activity, increased spontaneous activity, decreased motor unit amplitude, and diminished recruitment.  All remaining muscles (as indicated in the following table) showed no evidence of electrical instability.    Impression: The above electrodiagnostic study is ABNORMAL and reveals evidence of:  1.  A severe bilateral median nerve entrapment at the wrist (carpal tunnel syndrome) affecting sensory and motor components. The lesion is characterized by sensory and motor  demyelination with evidence of significant axonal injury.   2. 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   3. 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   There is no significant electrodiagnostic evidence of any other focal nerve entrapment or brachial plexopathy.    Recommendations: 1.  Follow-up with referring physician. 2.  Continue current management of symptoms. 3.  Suggest surgical evaluation.  Nerve Conduction Studies Anti Sensory Summary Table   Stim Site NR Peak (ms) Norm Peak (ms) P-T Amp (V) Norm P-T Amp Site1 Site2 Delta-P (ms) Dist (cm) Vel (m/s) Norm Vel (m/s)  Left Median Acr Palm Anti Sensory (2nd Digit)  33.8C  Wrist    *4.9 <3.6 *6.2 >10 Wrist Palm  0.0    Palm *NR  <2.0          Right Median Acr Palm Anti Sensory (2nd Digit)  32.7C  Wrist    *6.4 <3.6 *8.1 >10 Wrist Palm 2.3 0.0    Palm    *4.1 <2.0 2.5         Left Radial Anti Sensory (Base 1st Digit)  34.3C  Wrist    2.3 <3.1 20.3  Wrist Base 1st Digit 2.3 0.0    Right Radial Anti Sensory (Base 1st Digit)  33.2C  Wrist    2.2 <3.1 15.5  Wrist Base 1st Digit 2.2 0.0    Left Ulnar Anti Sensory (5th Digit)  34.1C  Wrist    *4.0 <3.7 *9.4 >15.0 Wrist 5th Digit 4.0 14.0 *35 >38  Right Ulnar Anti Sensory (5th Digit)  33.1C  Wrist    3.7 <3.7 *5.8 >15.0 Wrist 5th Digit 3.7 14.0 38 >38   Motor Summary Table  Stim Site NR Onset (ms) Norm Onset (ms) O-P Amp (mV) Norm O-P Amp Site1 Site2 Delta-0 (ms) Dist (cm) Vel (m/s) Norm Vel (m/s)  Left Median Motor (Abd Poll Brev)  34.3C  Wrist    *7.8 <4.2 *2.5 >5 Elbow Wrist 5.9 23.2 *39 >50  Elbow    13.7  2.3         Right Median Motor (Abd Poll Brev)  33.4C  Wrist    *5.5 <4.2 *0.1 >5 Elbow Wrist 8.5 24.0 *28 >50  Elbow    14.0  0.6         Left Ulnar Motor (Abd Dig Min)  34.4C  Wrist    3.4 <4.2 3.1 >3 B Elbow Wrist 4.1 22.0 54 >53  B Elbow    7.5  1.9  A  Elbow B Elbow 2.5 9.0 *36 >53  A Elbow    10.0  1.6         Right Ulnar Motor (Abd Dig Min)  33.6C  Wrist    3.5 <4.2 3.3 >3 B Elbow Wrist 3.9 22.0 56 >53  B Elbow    7.4  3.2  A Elbow B Elbow 1.7 9.5 56 >53  A Elbow    9.1  2.7          EMG   Side Muscle Nerve Root Ins Act Fibs Psw Amp Dur Poly Recrt Int Fraser Din Comment  Right Abd Poll Brev Median C8-T1 *Decr *3+ *3+ *Decr Nml 0 *Reduced Nml rare muap  Right 1stDorInt Ulnar C8-T1 Nml Nml Nml Nml Nml 0 Nml Nml   Right PronatorTeres Median C6-7 Nml Nml Nml Nml Nml 0 Nml Nml   Right Biceps Musculocut C5-6 Nml Nml Nml Nml Nml 0 Nml Nml   Right Deltoid Axillary C5-6 Nml Nml Nml Nml Nml 0 Nml Nml   Left Abd Poll Brev Median C8-T1 Nml Nml Nml Nml Nml 0 Nml Nml   Left 1stDorInt Ulnar C8-T1 Nml Nml Nml Nml Nml 0 Nml Nml   Left PronatorTeres Median C6-7 Nml Nml Nml Nml Nml 0 Nml Nml   Left Biceps Musculocut C5-6 Nml Nml Nml Nml Nml 0 Nml Nml   Left Deltoid Axillary C5-6 Nml Nml Nml Nml Nml 0 Nml Nml     Nerve Conduction Studies Anti Sensory Left/Right Comparison   Stim Site L Lat (ms) R Lat (ms) L-R Lat (ms) L Amp (V) R Amp (V) L-R Amp (%) Site1 Site2 L Vel (m/s) R Vel (m/s) L-R Vel (m/s)  Median Acr Palm Anti Sensory (2nd Digit)  33.8C  Wrist *4.9 *6.4 1.5 *6.2 *8.1 23.5 Wrist Palm     Palm  *4.1   2.5        Radial Anti Sensory (Base 1st Digit)  34.3C  Wrist 2.3 2.2 0.1 20.3 15.5 23.6 Wrist Base 1st Digit     Ulnar Anti Sensory (5th Digit)  34.1C  Wrist *4.0 3.7 0.3 *9.4 *5.8 38.3 Wrist 5th Digit *35 38 3   Motor Left/Right Comparison   Stim Site L Lat (ms) R Lat (ms) L-R Lat (ms) L Amp (mV) R Amp (mV) L-R Amp (%) Site1 Site2 L Vel (m/s) R Vel (m/s) L-R Vel (m/s)  Median Motor (Abd Poll Brev)  34.3C  Wrist *7.8 *5.5 *2.3 *2.5 *0.1 *96.0 Elbow Wrist *39 *28 *11  Elbow 13.7 14.0 0.3 2.3 0.6 73.9       Ulnar Motor (Abd Dig Min)  34.4C  Wrist 3.4 3.5 0.1 3.1 3.3 6.1  B Elbow Wrist 54 56 2  B Elbow 7.5 7.4 0.1 1.9 3.2 40.6 A Elbow B  Elbow *36 56 *20  A Elbow 10.0 9.1 0.9 1.6 2.7 40.7          Waveforms:

## 2017-11-04 DIAGNOSIS — E1129 Type 2 diabetes mellitus with other diabetic kidney complication: Secondary | ICD-10-CM | POA: Diagnosis not present

## 2017-11-04 DIAGNOSIS — K2211 Ulcer of esophagus with bleeding: Secondary | ICD-10-CM | POA: Diagnosis not present

## 2017-11-04 DIAGNOSIS — Z992 Dependence on renal dialysis: Secondary | ICD-10-CM | POA: Diagnosis not present

## 2017-11-04 DIAGNOSIS — D509 Iron deficiency anemia, unspecified: Secondary | ICD-10-CM | POA: Diagnosis not present

## 2017-11-04 DIAGNOSIS — N2581 Secondary hyperparathyroidism of renal origin: Secondary | ICD-10-CM | POA: Diagnosis not present

## 2017-11-04 DIAGNOSIS — N186 End stage renal disease: Secondary | ICD-10-CM | POA: Diagnosis not present

## 2017-11-06 DIAGNOSIS — Z992 Dependence on renal dialysis: Secondary | ICD-10-CM | POA: Diagnosis not present

## 2017-11-06 DIAGNOSIS — N186 End stage renal disease: Secondary | ICD-10-CM | POA: Diagnosis not present

## 2017-11-06 DIAGNOSIS — K2211 Ulcer of esophagus with bleeding: Secondary | ICD-10-CM | POA: Diagnosis not present

## 2017-11-06 DIAGNOSIS — E1129 Type 2 diabetes mellitus with other diabetic kidney complication: Secondary | ICD-10-CM | POA: Diagnosis not present

## 2017-11-06 DIAGNOSIS — D509 Iron deficiency anemia, unspecified: Secondary | ICD-10-CM | POA: Diagnosis not present

## 2017-11-06 DIAGNOSIS — N2581 Secondary hyperparathyroidism of renal origin: Secondary | ICD-10-CM | POA: Diagnosis not present

## 2017-11-09 DIAGNOSIS — N186 End stage renal disease: Secondary | ICD-10-CM | POA: Diagnosis not present

## 2017-11-09 DIAGNOSIS — Z992 Dependence on renal dialysis: Secondary | ICD-10-CM | POA: Diagnosis not present

## 2017-11-09 DIAGNOSIS — E1129 Type 2 diabetes mellitus with other diabetic kidney complication: Secondary | ICD-10-CM | POA: Diagnosis not present

## 2017-11-09 DIAGNOSIS — D509 Iron deficiency anemia, unspecified: Secondary | ICD-10-CM | POA: Diagnosis not present

## 2017-11-09 DIAGNOSIS — N2581 Secondary hyperparathyroidism of renal origin: Secondary | ICD-10-CM | POA: Diagnosis not present

## 2017-11-09 DIAGNOSIS — K2211 Ulcer of esophagus with bleeding: Secondary | ICD-10-CM | POA: Diagnosis not present

## 2017-11-10 ENCOUNTER — Ambulatory Visit (INDEPENDENT_AMBULATORY_CARE_PROVIDER_SITE_OTHER): Payer: Medicare Other | Admitting: Orthopaedic Surgery

## 2017-11-10 ENCOUNTER — Other Ambulatory Visit (INDEPENDENT_AMBULATORY_CARE_PROVIDER_SITE_OTHER): Payer: Self-pay | Admitting: Orthopaedic Surgery

## 2017-11-10 ENCOUNTER — Encounter (INDEPENDENT_AMBULATORY_CARE_PROVIDER_SITE_OTHER): Payer: Self-pay | Admitting: Orthopaedic Surgery

## 2017-11-10 DIAGNOSIS — G5602 Carpal tunnel syndrome, left upper limb: Secondary | ICD-10-CM

## 2017-11-10 DIAGNOSIS — G5601 Carpal tunnel syndrome, right upper limb: Secondary | ICD-10-CM | POA: Diagnosis not present

## 2017-11-10 NOTE — Progress Notes (Signed)
The patient is well-known to me.  She is a 67 year old who is blind with diabetes and is on dialysis.  We did send her for nerve conduction studies due to severe burning in both of her hands.  Her left is much worse than the right in terms of the symptoms she feels.  We are concerned about carpal tunnel syndrome but also polyneuropathy of her diabetes.  She reports decent blood sugar control.  On exam both hands show muscle atrophy and wasting with a weak grip and pinch strength.  She has subjective decreased sensation throughout both hands.  Nerve conduction studies are shared with her and her family went over them in detail.  He does show that she has severe bilateral carpal tunnel syndrome.  She also has a component of polyneuropathy as well.  Of note she has a fistula in her right arm for dialysis access.  This is in her upper arm.  I talked about her studies and what this means in general.  She understands that with carpal tunnel surgery she may get some relief of her symptoms but it likely not resolve all her symptoms given her diabetes and polyneuropathy but certainly given the severity of her carpal tunnel syndrome at the transverse carpal ligament it would improve some of her systems significantly.  We had a long and thorough discussion about the surgery including what the surgery involves and the risk and benefits of this.  She understands she is are heightened risk of infection given her blood glucose and diabetes.  All questions concerns were answered and addressed.  She is hoping to proceed with a left open carpal tunnel release in the near future.  We would then see her back in 2 weeks postoperative for suture removal.

## 2017-11-11 DIAGNOSIS — K2211 Ulcer of esophagus with bleeding: Secondary | ICD-10-CM | POA: Diagnosis not present

## 2017-11-11 DIAGNOSIS — E1129 Type 2 diabetes mellitus with other diabetic kidney complication: Secondary | ICD-10-CM | POA: Diagnosis not present

## 2017-11-11 DIAGNOSIS — D509 Iron deficiency anemia, unspecified: Secondary | ICD-10-CM | POA: Diagnosis not present

## 2017-11-11 DIAGNOSIS — N186 End stage renal disease: Secondary | ICD-10-CM | POA: Diagnosis not present

## 2017-11-11 DIAGNOSIS — N2581 Secondary hyperparathyroidism of renal origin: Secondary | ICD-10-CM | POA: Diagnosis not present

## 2017-11-11 DIAGNOSIS — Z992 Dependence on renal dialysis: Secondary | ICD-10-CM | POA: Diagnosis not present

## 2017-11-11 DIAGNOSIS — E1142 Type 2 diabetes mellitus with diabetic polyneuropathy: Secondary | ICD-10-CM | POA: Diagnosis not present

## 2017-11-13 DIAGNOSIS — E1129 Type 2 diabetes mellitus with other diabetic kidney complication: Secondary | ICD-10-CM | POA: Diagnosis not present

## 2017-11-13 DIAGNOSIS — D509 Iron deficiency anemia, unspecified: Secondary | ICD-10-CM | POA: Diagnosis not present

## 2017-11-13 DIAGNOSIS — K2211 Ulcer of esophagus with bleeding: Secondary | ICD-10-CM | POA: Diagnosis not present

## 2017-11-13 DIAGNOSIS — Z992 Dependence on renal dialysis: Secondary | ICD-10-CM | POA: Diagnosis not present

## 2017-11-13 DIAGNOSIS — N2581 Secondary hyperparathyroidism of renal origin: Secondary | ICD-10-CM | POA: Diagnosis not present

## 2017-11-13 DIAGNOSIS — N186 End stage renal disease: Secondary | ICD-10-CM | POA: Diagnosis not present

## 2017-11-16 ENCOUNTER — Other Ambulatory Visit (INDEPENDENT_AMBULATORY_CARE_PROVIDER_SITE_OTHER): Payer: Self-pay | Admitting: Physician Assistant

## 2017-11-16 DIAGNOSIS — D509 Iron deficiency anemia, unspecified: Secondary | ICD-10-CM | POA: Diagnosis not present

## 2017-11-16 DIAGNOSIS — N186 End stage renal disease: Secondary | ICD-10-CM | POA: Diagnosis not present

## 2017-11-16 DIAGNOSIS — N2581 Secondary hyperparathyroidism of renal origin: Secondary | ICD-10-CM | POA: Diagnosis not present

## 2017-11-16 DIAGNOSIS — K2211 Ulcer of esophagus with bleeding: Secondary | ICD-10-CM | POA: Diagnosis not present

## 2017-11-16 DIAGNOSIS — E1129 Type 2 diabetes mellitus with other diabetic kidney complication: Secondary | ICD-10-CM | POA: Diagnosis not present

## 2017-11-16 DIAGNOSIS — Z992 Dependence on renal dialysis: Secondary | ICD-10-CM | POA: Diagnosis not present

## 2017-11-16 NOTE — Pre-Procedure Instructions (Signed)
[name]  11/16/2017      [id number] - [address] [place] [address] Phone: [phone] Fax: [phone]    Your procedure is scheduled on 11-26-2017  Thursday   Report to [facility] Admitting at 8:30 A.M.   Call this number if you have problems the morning of surgery:  [phone]   Remember:  Do not eat food or drink liquids after midnight.   Take these medicines the morning of surgery with A SIP OF WATER Albuterol nebulizer  If needed Atropine eye drops Carvedilol(Coreg) Clonidine(Catapress) Docusate sodium(Colace) Pantoprazole(Protonix) Pred Forte eye drops  STOP TAKING ANY ASPIRIN(UNLESS OTHERWISE INSTRUCTED BY YOUR SURGEON),ANTIINFLAMATORIES (IBUPROFEN,ALEVE,MOTRIN,ADVIL,GOODY'S POWDERS),HERBAL SUPPLEMENTS,FISH OIL,AND VITAMINS 5-7 DAYS PRIOR TO SURGERY       How to Manage Your Diabetes Before and After Surgery  Why is it important to control my blood sugar before and after surgery? . Improving blood sugar levels before and after surgery helps healing and can limit problems. . A way of improving blood sugar control is eating a healthy diet by: o  Eating less sugar and carbohydrates o  Increasing activity/exercise o  Talking with your doctor about reaching your blood sugar goals . High blood sugars (greater than 180 mg/dL) can raise your risk of infections and slow your recovery, so you will need to focus on controlling your diabetes during the weeks before surgery. . Make sure that the doctor who takes care of your diabetes knows about your planned surgery including the date and location.  How do I manage my blood sugar before surgery? . Check your blood sugar at least 4 times a day, starting 2 days before surgery, to make sure that the level is not too high or low. o Check your blood sugar the morning of your surgery when you wake up  and every 2 hours until you get to the Short Stay unit. . If your blood sugar is less than 70 mg/dL, you will need to treat for low blood sugar: o Do not take insulin. o Treat a low blood sugar (less than 70 mg/dL) with  cup of clear juice (cranberry or apple), 4 glucose tablets, OR glucose gel. Recheck blood sugar in 15 minutes after treatment (to make sure it is greater than 70 mg/dL). If your blood sugar is not greater than 70 mg/dL on recheck, call [phone] o  for further instructions. . Report your blood sugar to the short stay nurse when you get to Short Stay.  . If you are admitted to the hospital after surgery: o Your blood sugar will be checked by the staff and you will probably be given insulin after surgery (instead of oral diabetes medicines) to make sure you have good blood sugar levels. o The goal for blood sugar control after surgery is 80-180 mg/dL.              WHAT DO I DO ABOUT MY DIABETES MEDICATION?   Marland Kitchen Do not take oral diabetes medicines (pills) the morning of surgery.  . THE NIGHT BEFORE SURGERY, take ___________ units of ___________insulin.         . The day of surgery, do not take other diabetes injectables, including Byetta (exenatide), Bydureon (exenatide ER), Victoza (liraglutide), or Trulicity (dulaglutide).  . If your CBG is greater than 220 mg/dL, you may take  of your sliding scale (correction) dose of insulin.  Other Instructions:        :  : :  Reviewed and Endorsed by [facility] Patient Education Committee, August 2015      Do not wear jewelry, make-up or nail polish.  Do not wear lotions, powders, or perfumes, or deodorant.  Do not shave 48 hours prior to surgery.  Men may shave face and neck.  Do not bring valuables to the hospital.  [facility] is not responsible for any belongings or valuables.  Contacts, dentures or bridgework may not be worn into surgery.  Leave your suitcase in the car.  After surgery it may be  brought to your room.  For patients admitted to the hospital, discharge time will be determined by your treatment team.  Patients discharged the day of surgery will not be allowed to drive home.    Special Instructions: [place] - Preparing for Surgery  Before surgery, you can play an important role.  Because skin is not sterile, your skin needs to be as free of germs as possible.  You can reduce the number of germs on you skin by washing with CHG (chlorahexidine gluconate) soap before surgery.  CHG is an antiseptic cleaner which kills germs and bonds with the skin to continue killing germs even after washing.  Please DO NOT use if you have an allergy to CHG or antibacterial soaps.  If your skin becomes reddened/irritated stop using the CHG and inform your nurse when you arrive at Short Stay.  Do not shave (including legs and underarms) for at least 48 hours prior to the first CHG shower.  You may shave your face.  Please follow these instructions carefully:   1.  Shower with CHG Soap the night before surgery and the   morning of Surgery.  2.  If you choose to wash your hair, wash your hair first as usual with your normal shampoo.  3.  After you shampoo, rinse your hair and body thoroughly to remove the  Shampoo.  4.  Use CHG as you would any other liquid soap.  You can apply chg directly  to the skin and wash gently with scrungie or a clean washcloth.  5.  Apply the CHG Soap to your body ONLY FROM THE NECK DOWN.   Do not use on open wounds or open sores.  Avoid contact with your eyes,  ears, mouth and genitals (private parts).  Wash genitals (private parts) with your normal soap.  6.  Wash thoroughly, paying special attention to the area where your surgery will be performed.  7.  Thoroughly rinse your body with warm water from the neck down.  8.  DO NOT shower/wash with your normal soap after using and rinsing o  the CHG Soap.  9.  Pat yourself dry with a clean towel.            10.   Wear clean pajamas.            11.  Place clean sheets on your bed the night of your first shower and do not sleep with pets.  Day of Surgery  Do not apply any lotions/deodorants the morning of surgery.  Please wear clean clothes to the hospital/surgery center.   Please read over the following fact sheets that you were given. Surgical Site Infection Prevention

## 2017-11-17 ENCOUNTER — Inpatient Hospital Stay (HOSPITAL_COMMUNITY)
Admission: RE | Admit: 2017-11-17 | Discharge: 2017-11-17 | Disposition: A | Discharge status: Home / Self Care | Payer: Medicare Other | Source: Ambulatory Visit

## 2017-11-18 DIAGNOSIS — N2581 Secondary hyperparathyroidism of renal origin: Secondary | ICD-10-CM | POA: Diagnosis not present

## 2017-11-18 DIAGNOSIS — N189 Chronic kidney disease, unspecified: Secondary | ICD-10-CM | POA: Diagnosis not present

## 2017-11-18 DIAGNOSIS — Z992 Dependence on renal dialysis: Secondary | ICD-10-CM | POA: Diagnosis not present

## 2017-11-18 DIAGNOSIS — N186 End stage renal disease: Secondary | ICD-10-CM | POA: Diagnosis not present

## 2017-11-18 DIAGNOSIS — K2211 Ulcer of esophagus with bleeding: Secondary | ICD-10-CM | POA: Diagnosis not present

## 2017-11-18 DIAGNOSIS — D509 Iron deficiency anemia, unspecified: Secondary | ICD-10-CM | POA: Diagnosis not present

## 2017-11-18 DIAGNOSIS — E1129 Type 2 diabetes mellitus with other diabetic kidney complication: Secondary | ICD-10-CM | POA: Diagnosis not present

## 2017-11-18 DIAGNOSIS — D631 Anemia in chronic kidney disease: Secondary | ICD-10-CM | POA: Diagnosis not present

## 2017-11-20 DIAGNOSIS — N186 End stage renal disease: Secondary | ICD-10-CM | POA: Diagnosis not present

## 2017-11-20 DIAGNOSIS — K2211 Ulcer of esophagus with bleeding: Secondary | ICD-10-CM | POA: Diagnosis not present

## 2017-11-20 DIAGNOSIS — Z992 Dependence on renal dialysis: Secondary | ICD-10-CM | POA: Diagnosis not present

## 2017-11-20 DIAGNOSIS — D509 Iron deficiency anemia, unspecified: Secondary | ICD-10-CM | POA: Diagnosis not present

## 2017-11-20 DIAGNOSIS — N2581 Secondary hyperparathyroidism of renal origin: Secondary | ICD-10-CM | POA: Diagnosis not present

## 2017-11-20 DIAGNOSIS — D631 Anemia in chronic kidney disease: Secondary | ICD-10-CM | POA: Diagnosis not present

## 2017-11-23 DIAGNOSIS — Z992 Dependence on renal dialysis: Secondary | ICD-10-CM | POA: Diagnosis not present

## 2017-11-23 DIAGNOSIS — N186 End stage renal disease: Secondary | ICD-10-CM | POA: Diagnosis not present

## 2017-11-23 DIAGNOSIS — D509 Iron deficiency anemia, unspecified: Secondary | ICD-10-CM | POA: Diagnosis not present

## 2017-11-23 DIAGNOSIS — K2211 Ulcer of esophagus with bleeding: Secondary | ICD-10-CM | POA: Diagnosis not present

## 2017-11-23 DIAGNOSIS — N2581 Secondary hyperparathyroidism of renal origin: Secondary | ICD-10-CM | POA: Diagnosis not present

## 2017-11-23 DIAGNOSIS — D631 Anemia in chronic kidney disease: Secondary | ICD-10-CM | POA: Diagnosis not present

## 2017-11-24 ENCOUNTER — Other Ambulatory Visit: Payer: Self-pay

## 2017-11-24 ENCOUNTER — Encounter (HOSPITAL_COMMUNITY)
Admission: RE | Admit: 2017-11-24 | Discharge: 2017-11-24 | Disposition: A | Discharge status: Home / Self Care | Payer: Medicare Other | Source: Ambulatory Visit | Attending: Orthopaedic Surgery | Admitting: Orthopaedic Surgery

## 2017-11-24 ENCOUNTER — Encounter (HOSPITAL_COMMUNITY): Payer: Self-pay

## 2017-11-24 DIAGNOSIS — F419 Anxiety disorder, unspecified: Secondary | ICD-10-CM | POA: Diagnosis not present

## 2017-11-24 DIAGNOSIS — Z7982 Long term (current) use of aspirin: Secondary | ICD-10-CM | POA: Diagnosis not present

## 2017-11-24 DIAGNOSIS — I130 Hypertensive heart and chronic kidney disease with heart failure and stage 1 through stage 4 chronic kidney disease, or unspecified chronic kidney disease: Secondary | ICD-10-CM | POA: Diagnosis not present

## 2017-11-24 DIAGNOSIS — I509 Heart failure, unspecified: Secondary | ICD-10-CM | POA: Diagnosis not present

## 2017-11-24 DIAGNOSIS — N186 End stage renal disease: Secondary | ICD-10-CM | POA: Diagnosis not present

## 2017-11-24 DIAGNOSIS — E1122 Type 2 diabetes mellitus with diabetic chronic kidney disease: Secondary | ICD-10-CM | POA: Diagnosis not present

## 2017-11-24 DIAGNOSIS — G5602 Carpal tunnel syndrome, left upper limb: Secondary | ICD-10-CM | POA: Diagnosis not present

## 2017-11-24 DIAGNOSIS — K219 Gastro-esophageal reflux disease without esophagitis: Secondary | ICD-10-CM | POA: Diagnosis not present

## 2017-11-24 DIAGNOSIS — Z794 Long term (current) use of insulin: Secondary | ICD-10-CM | POA: Diagnosis not present

## 2017-11-24 DIAGNOSIS — Z853 Personal history of malignant neoplasm of breast: Secondary | ICD-10-CM | POA: Diagnosis not present

## 2017-11-24 DIAGNOSIS — Z79899 Other long term (current) drug therapy: Secondary | ICD-10-CM | POA: Diagnosis not present

## 2017-11-24 LAB — CBC
HEMATOCRIT: 39 % (ref 36.0–46.0)
HEMOGLOBIN: 12.6 g/dL (ref 12.0–15.0)
MCH: 31.7 pg (ref 26.0–34.0)
MCHC: 32.3 g/dL (ref 30.0–36.0)
MCV: 98.2 fL (ref 78.0–100.0)
Platelets: 223 10*3/uL (ref 150–400)
RBC: 3.97 MIL/uL (ref 3.87–5.11)
RDW: 18.5 % — ABNORMAL HIGH (ref 11.5–15.5)
WBC: 5.1 10*3/uL (ref 4.0–10.5)

## 2017-11-24 LAB — HEMOGLOBIN A1C
Hgb A1c MFr Bld: 5.9 % — ABNORMAL HIGH (ref 4.8–5.6)
Mean Plasma Glucose: 122.63 mg/dL

## 2017-11-24 LAB — GLUCOSE, CAPILLARY: GLUCOSE-CAPILLARY: 135 mg/dL — AB (ref 65–99)

## 2017-11-24 LAB — BASIC METABOLIC PANEL
Anion gap: 15 (ref 5–15)
BUN: 26 mg/dL — ABNORMAL HIGH (ref 6–20)
CHLORIDE: 99 mmol/L — AB (ref 101–111)
CO2: 27 mmol/L (ref 22–32)
CREATININE: 8.47 mg/dL — AB (ref 0.44–1.00)
Calcium: 8.7 mg/dL — ABNORMAL LOW (ref 8.9–10.3)
GFR calc non Af Amer: 4 mL/min — ABNORMAL LOW (ref >60)
GFR, EST AFRICAN AMERICAN: 5 mL/min — AB (ref >60)
Glucose, Bld: 143 mg/dL — ABNORMAL HIGH (ref 65–99)
POTASSIUM: 4.8 mmol/L (ref 3.5–5.1)
SODIUM: 141 mmol/L (ref 135–145)

## 2017-11-24 NOTE — Pre-Procedure Instructions (Addendum)
[name]  11/24/2017      [id number] - [address] AT [employer] [place] [address] Phone: [phone] Fax: [phone]    Your procedure is scheduled on 11-26-2017  Thursday   Report to [facility] Admitting at 8:30 A.M.   Call this number if you have problems the morning of surgery:  [phone]   Remember:  Do not eat food or drink liquids after midnight.   Take these medicines the morning of surgery with A SIP OF WATER Albuterol nebulizer  If needed Atropine eye drops Carvedilol(Coreg) Clonidine(Catapress) Docusate sodium(Colace) Omeprazole (prilosec) Pred Forte eye drops Gabapentin Tylenol if needed  STOP TAKING ANY ASPIRIN (UNLESS OTHERWISE INSTRUCTED BY YOUR SURGEON),ANTIINFLAMATORIES (IBUPROFEN,ALEVE,MOTRIN,ADVIL,GOODY'S POWDERS),HERBAL SUPPLEMENTS,FISH OIL,AND VITAMINS 5-7 DAYS PRIOR TO SURGERY       How to Manage Your Diabetes Before and After Surgery  Why is it important to control my blood sugar before and after surgery? . Improving blood sugar levels before and after surgery helps healing and can limit problems. . A way of improving blood sugar control is eating a healthy diet by: o  Eating less sugar and carbohydrates o  Increasing activity/exercise o  Talking with your doctor about reaching your blood sugar goals . High blood sugars (greater than 180 mg/dL) can raise your risk of infections and slow your recovery, so you will need to focus on controlling your diabetes during the weeks before surgery. . Make sure that the doctor who takes care of your diabetes knows about your planned surgery including the date and location.  How do I manage my blood sugar before surgery? . Check your blood sugar at least 4 times a day, starting 2 days before surgery, to make sure that the level is not too high or low. o Check your blood sugar the morning of  your surgery when you wake up and every 2 hours until you get to the Short Stay unit. . If your blood sugar is less than 70 mg/dL, you will need to treat for low blood sugar: o Do not take insulin. o Treat a low blood sugar (less than 70 mg/dL) with  cup of clear juice (cranberry or apple), 4 glucose tablets, OR glucose gel. Recheck blood sugar in 15 minutes after treatment (to make sure it is greater than 70 mg/dL). If your blood sugar is not greater than 70 mg/dL on recheck, call [phone] o  for further instructions. . Report your blood sugar to the short stay nurse when you get to Short Stay.  . If you are admitted to the hospital after surgery: o Your blood sugar will be checked by the staff and you will probably be given insulin after surgery (instead of oral diabetes medicines) to make sure you have good blood sugar levels. o The goal for blood sugar control after surgery is 80-180 mg/dL.        WHAT DO I DO ABOUT MY DIABETES MEDICATION?       . If your CBG is greater than 220 mg/dL, you may take  of your sliding scale (correction) dose of insulin.        Do not wear jewelry, make-up or nail polish.  Do not wear lotions, powders, or perfumes, or deodorant.  Do not shave 48 hours prior to surgery.  Men may shave face and neck.  Do not bring valuables to the hospital.  [facility] is  not responsible for any belongings or valuables.  Contacts, dentures or bridgework may not be worn into surgery.  Leave your suitcase in the car.  After surgery it may be brought to your room.  For patients admitted to the hospital, discharge time will be determined by your treatment team.  Patients discharged the day of surgery will not be allowed to drive home.    Special Instructions: [place] - Preparing for Surgery  Before surgery, you can play an important role.  Because skin is not sterile, your skin needs to be as free of germs as possible.  You can reduce the number of  germs on you skin by washing with CHG (chlorahexidine gluconate) soap before surgery.  CHG is an antiseptic cleaner which kills germs and bonds with the skin to continue killing germs even after washing.  Please DO NOT use if you have an allergy to CHG or antibacterial soaps.  If your skin becomes reddened/irritated stop using the CHG and inform your nurse when you arrive at Short Stay.  Do not shave (including legs and underarms) for at least 48 hours prior to the first CHG shower.  You may shave your face.  Please follow these instructions carefully:   1.  Shower with CHG Soap the night before surgery and the   morning of Surgery.  2.  If you choose to wash your hair, wash your hair first as usual with your normal shampoo.  3.  After you shampoo, rinse your hair and body thoroughly to remove the  Shampoo.  4.  Use CHG as you would any other liquid soap.  You can apply chg directly  to the skin and wash gently with scrungie or a clean washcloth.  5.  Apply the CHG Soap to your body ONLY FROM THE NECK DOWN.   Do not use on open wounds or open sores.  Avoid contact with your eyes,  ears, mouth and genitals (private parts).  Wash genitals (private parts) with your normal soap.  6.  Wash thoroughly, paying special attention to the area where your surgery will be performed.  7.  Thoroughly rinse your body with warm water from the neck down.  8.  DO NOT shower/wash with your normal soap after using and rinsing o  the CHG Soap.  9.  Pat yourself dry with a clean towel.            10.  Wear clean pajamas.            11.  Place clean sheets on your bed the night of your first shower and do not sleep with pets.  Day of Surgery  Do not apply any lotions/deodorants the morning of surgery.  Please wear clean clothes to the hospital/surgery center.   Please read over the following fact sheets that you were given. Surgical Site Infection Prevention

## 2017-11-24 NOTE — Progress Notes (Signed)
PCP: Dr. Joyce Copa Cardiologist: doesn't remember,thinks [facility] ,states been 2 yrs. Since seen Nephrology: Dr. Marcello Moores  Fasting sugars 125-140  Pt. [name] to check with Dr. Ninfa Linden when to hold aspirin. Pt. Goes to dialysis M-W-F @ [facility]

## 2017-11-25 DIAGNOSIS — N2581 Secondary hyperparathyroidism of renal origin: Secondary | ICD-10-CM | POA: Diagnosis not present

## 2017-11-25 DIAGNOSIS — N186 End stage renal disease: Secondary | ICD-10-CM | POA: Diagnosis not present

## 2017-11-25 DIAGNOSIS — K2211 Ulcer of esophagus with bleeding: Secondary | ICD-10-CM | POA: Diagnosis not present

## 2017-11-25 DIAGNOSIS — Z992 Dependence on renal dialysis: Secondary | ICD-10-CM | POA: Diagnosis not present

## 2017-11-25 DIAGNOSIS — D631 Anemia in chronic kidney disease: Secondary | ICD-10-CM | POA: Diagnosis not present

## 2017-11-25 DIAGNOSIS — D509 Iron deficiency anemia, unspecified: Secondary | ICD-10-CM | POA: Diagnosis not present

## 2017-11-25 NOTE — Progress Notes (Addendum)
Anesthesia Chart Review:   Case:  [id number] Date/Time:  11/26/17 1015   Procedure:  LEFT CARPAL TUNNEL RELEASE (Left )   Anesthesia type:  Monitor Anesthesia Care   Pre-op diagnosis:  left carpal tunnel syndrome   Location:  [facility] OR ROOM 09 / [place] OR   Surgeon:  [name], MD      DISCUSSION:  - Pt is a 67 year old female with hx chornic combined CHF, ESRD on hemodialysis - Echo 10/2016 showed reduced EF 45-50%, diffuse hypokinesis, moderate MR.  - Hospitalized 12/17-12/24/18 for symptomatic acute blood loss anemia due to esophageal ulcers   VS: BP (!) 163/67   Pulse 74   Temp 36.7 C   Resp 20   Ht 5\' 5"  (1.651 m)   Wt 192 lb 14.4 oz (87.5 kg)   SpO2 97%   BMI 32.10 kg/m    PROVIDERS: Prince Solian, MD   Patient Care Team: Fleet Contras, MD as Consulting Physician (Nephrology)   Used to see cardiologist Mertie Moores, MD for CHF. Last office visit 12/28/13; follow up prn recommended.    LABS: Labs reviewed: Acceptable for surgery.  - Renal function is consistent with ESRD  (all labs ordered are listed, but only abnormal results are displayed)  Labs Reviewed  GLUCOSE, CAPILLARY - Abnormal; Notable for the following components:      Result Value   Glucose-Capillary 135 (*)    All other components within normal limits  HEMOGLOBIN A1C - Abnormal; Notable for the following components:   Hgb A1c MFr Bld 5.9 (*)    All other components within normal limits  BASIC METABOLIC PANEL - Abnormal; Notable for the following components:   Chloride 99 (*)    Glucose, Bld 143 (*)    BUN 26 (*)    Creatinine, Ser 8.47 (*)    Calcium 8.7 (*)    GFR calc non Af Amer 4 (*)    GFR calc Af Amer 5 (*)    All other components within normal limits  CBC - Abnormal; Notable for the following components:   RDW 18.5 (*)    All other components within normal limits     IMAGES:  CXR 07/06/17:  - Cardiomegaly. - Pulmonary vascular congestion with minimal fissural  thickening suggestive of mild pulmonary edema. - Aortic Atherosclerosis   CT chest 10/28/16:  1. Minimal bilateral peribronchovascular opacities likely reflect atelectasis. Lungs otherwise clear. 2. Mild cardiomegaly. Calcification at the aortic and mitral valves. 3. Scattered coronary artery calcifications seen.   EKG 10/28/17: NSR. LVH with repolarization abnormality   CV:  Echo 10/30/16:  - Left ventricle: The cavity size was normal. There was moderate concentric hypertrophy. Systolic function was mildly reduced. The estimated ejection fraction was in the range of 45% to 50%. Diffuse hypokinesis. Features are consistent with a pseudonormal left ventricular filling pattern, with concomitant abnormal relaxation and increased filling pressure (grade 2 diastolic dysfunction). - Aortic valve: Valve mobility was restricted. There was mild stenosis. Peak velocity (S): 238 cm/s. Mean gradient (S): 13 mm Hg. - Mitral valve: Severely calcified annulus. There was moderate regurgitation directed posteriorly. - Left atrium: The atrium was moderately dilated. Volume/bsa, ES, (1-plane Simpson&'s, A2C): 41.5 ml/m^2. - Right ventricle: The cavity size was moderately dilated. Wall thickness was normal. - Right atrium: The atrium was moderately dilated. - Tricuspid valve: There was moderate regurgitation. - Pulmonary arteries: Systolic pressure was moderately increased. PA peak pressure: 64 mm Hg (S).  Cardiac cath 06/26/04:  1.  Smooth and normal coronary arteries. 2.  Normal left ventricular systolic function.   Past Medical History:  Diagnosis Date  . Anemia   . Anxiety   . Arthritis    "joints" (06/15/2013)  . Blood transfusion without reported diagnosis   . Breast cancer ([place])    left  . CHF (congestive heart failure) ([place])   . ESRD (end stage renal disease) ([place])    "suppose to start dialysis today" (06/15/2013)  . GERD (gastroesophageal reflux disease)   . Hypertension   . Myalgia  12/31/2011  . Neuropathy 12/31/2011  . Shortness of breath    "when she doesn't go to dialysis"  . Type II diabetes mellitus ([place])    - Hospitalized 12/17-12/24/18 for symptomatic acute blood loss anemia due to esophageal ulcers   Past Surgical History:  Procedure Laterality Date  . ABDOMINAL HYSTERECTOMY     partial  . AV FISTULA PLACEMENT Right 06/06/2013   Procedure: ARTERIOVENOUS (AV) FISTULA CREATION-RIGHT BRACHIAL CEPHALIC;  Surgeon: Conrad [place], MD;  Location: [place];  Service: Vascular;  Laterality: Right;  . BREAST BIOPSY Left   . BREAST LUMPECTOMY Left    "and took out some lymph nodes" (06/15/2013)  . CATARACT EXTRACTION W/ ANTERIOR VITRECTOMY Bilateral   . CESAREAN SECTION  1980  . ESOPHAGOGASTRODUODENOSCOPY N/A 07/08/2017   Procedure: ESOPHAGOGASTRODUODENOSCOPY (EGD);  Surgeon: [name], MD;  Location: [facility] ENDOSCOPY;  Service: Endoscopy;  Laterality: N/A;  . EYE SURGERY Bilateral    laser surgery  . PARS PLANA VITRECTOMY Right 05/05/2017   Procedure: PARS PLANA VITRECTOMY WITH 25 GAUGE; PARTIAL REMOVAL OF OIL; INFERIOR PERIPHERAL IRIDECTOMY, REFORM ANTERIOR CHAMBER RIGHT EYE;  Surgeon: [name], MD;  Location: [place];  Service: Ophthalmology;  Laterality: Right;  . REFRACTIVE SURGERY Bilateral   . REMOVAL OF A DIALYSIS CATHETER Right 06/06/2013   Procedure: REMOVAL OF RIGHT MEDIPORT;  Surgeon: Conrad Garrison, MD;  Location: [place];  Service: Vascular;  Laterality: Right;  . TONSILLECTOMY      MEDICATIONS: . acetaminophen (TYLENOL) 500 MG tablet  . albuterol (PROVENTIL) (2.5 MG/3ML) 0.083% nebulizer solution  . amLODipine (NORVASC) 10 MG tablet  . aspirin 81 MG chewable tablet  . atropine 1 % ophthalmic solution  . B Complex-C-Folic Acid (RENA-VITE RX) 1 MG TABS  . benzonatate (TESSALON PERLES) 100 MG capsule  . carvedilol (COREG) 12.5 MG tablet  . cloNIDine (CATAPRES) 0.2 MG tablet  . COMBIGAN 0.2-0.5 % ophthalmic solution  . darbepoetin (ARANESP) 200  MCG/0.4ML SOLN injection  . diphenhydrAMINE (BENADRYL) 25 MG tablet  . docusate sodium (COLACE) 100 MG capsule  . doxercalciferol (HECTOROL) 4 MCG/2ML injection  . fluticasone (FLONASE) 50 MCG/ACT nasal spray  . gabapentin (NEURONTIN) 100 MG capsule  . insulin aspart (NOVOLOG FLEXPEN) 100 UNIT/ML FlexPen  . lanthanum (FOSRENOL) 1000 MG chewable tablet  . lisinopril (PRINIVIL,ZESTRIL) 40 MG tablet  . omeprazole (PRILOSEC) 40 MG capsule  . pantoprazole (PROTONIX) 40 MG tablet  . prednisoLONE acetate (PRED FORTE) 1 % ophthalmic suspension  . sennosides (SENOKOT) 8.8 MG/5ML syrup   No current facility-administered medications for this encounter.     Pt will need further assessment by assigned anesthesiologist day of surgery.  If no active CV symptoms, I anticipate pt can proceed with surgery as scheduled.   Willeen Cass, FNP-BC [facility] Short Stay Surgical Center/Anesthesiology Phone: [phone] 11/25/2017 9:42 AM

## 2017-11-26 ENCOUNTER — Ambulatory Visit (HOSPITAL_COMMUNITY): Payer: Medicare Other | Admitting: Emergency Medicine

## 2017-11-26 ENCOUNTER — Ambulatory Visit (HOSPITAL_COMMUNITY): Payer: Medicare Other | Admitting: Anesthesiology

## 2017-11-26 ENCOUNTER — Encounter (HOSPITAL_COMMUNITY): Payer: Self-pay | Admitting: *Deleted

## 2017-11-26 ENCOUNTER — Encounter (HOSPITAL_COMMUNITY)
Admission: RE | Disposition: A | Discharge status: Home / Self Care | Payer: Self-pay | Source: Ambulatory Visit | Attending: Orthopaedic Surgery

## 2017-11-26 ENCOUNTER — Ambulatory Visit (HOSPITAL_COMMUNITY)
Admission: RE | Admit: 2017-11-26 | Discharge: 2017-11-26 | Disposition: A | Discharge status: Home / Self Care | Payer: Medicare Other | Source: Ambulatory Visit | Attending: Orthopaedic Surgery | Admitting: Orthopaedic Surgery

## 2017-11-26 DIAGNOSIS — Z853 Personal history of malignant neoplasm of breast: Secondary | ICD-10-CM | POA: Diagnosis not present

## 2017-11-26 DIAGNOSIS — E1122 Type 2 diabetes mellitus with diabetic chronic kidney disease: Secondary | ICD-10-CM | POA: Insufficient documentation

## 2017-11-26 DIAGNOSIS — N186 End stage renal disease: Secondary | ICD-10-CM | POA: Insufficient documentation

## 2017-11-26 DIAGNOSIS — Z7982 Long term (current) use of aspirin: Secondary | ICD-10-CM | POA: Insufficient documentation

## 2017-11-26 DIAGNOSIS — F419 Anxiety disorder, unspecified: Secondary | ICD-10-CM | POA: Insufficient documentation

## 2017-11-26 DIAGNOSIS — K219 Gastro-esophageal reflux disease without esophagitis: Secondary | ICD-10-CM | POA: Diagnosis not present

## 2017-11-26 DIAGNOSIS — N184 Chronic kidney disease, stage 4 (severe): Secondary | ICD-10-CM | POA: Diagnosis not present

## 2017-11-26 DIAGNOSIS — I5043 Acute on chronic combined systolic (congestive) and diastolic (congestive) heart failure: Secondary | ICD-10-CM | POA: Diagnosis not present

## 2017-11-26 DIAGNOSIS — Z794 Long term (current) use of insulin: Secondary | ICD-10-CM | POA: Diagnosis not present

## 2017-11-26 DIAGNOSIS — Z79899 Other long term (current) drug therapy: Secondary | ICD-10-CM | POA: Insufficient documentation

## 2017-11-26 DIAGNOSIS — I509 Heart failure, unspecified: Secondary | ICD-10-CM | POA: Insufficient documentation

## 2017-11-26 DIAGNOSIS — G5602 Carpal tunnel syndrome, left upper limb: Secondary | ICD-10-CM | POA: Diagnosis not present

## 2017-11-26 DIAGNOSIS — I130 Hypertensive heart and chronic kidney disease with heart failure and stage 1 through stage 4 chronic kidney disease, or unspecified chronic kidney disease: Secondary | ICD-10-CM | POA: Insufficient documentation

## 2017-11-26 HISTORY — PX: CARPAL TUNNEL RELEASE: SHX101

## 2017-11-26 LAB — GLUCOSE, CAPILLARY
GLUCOSE-CAPILLARY: 135 mg/dL — AB (ref 65–99)
Glucose-Capillary: 125 mg/dL — ABNORMAL HIGH (ref 65–99)

## 2017-11-26 LAB — POCT I-STAT 4, (NA,K, GLUC, HGB,HCT)
GLUCOSE: 122 mg/dL — AB (ref 65–99)
HEMATOCRIT: 40 % (ref 36.0–46.0)
Hemoglobin: 13.6 g/dL (ref 12.0–15.0)
Potassium: 4.6 mmol/L (ref 3.5–5.1)
SODIUM: 137 mmol/L (ref 135–145)

## 2017-11-26 SURGERY — CARPAL TUNNEL RELEASE
Anesthesia: Monitor Anesthesia Care | Site: Wrist | Laterality: Left

## 2017-11-26 MED ORDER — CHLORHEXIDINE GLUCONATE 4 % EX LIQD: 60.0000 mL | Freq: Once | CUTANEOUS | Status: DC

## 2017-11-26 MED ORDER — PROPOFOL 500 MG/50ML IV EMUL
INTRAVENOUS | Status: DC | PRN
  Administered 2017-11-26: 50 ug/kg/min via INTRAVENOUS

## 2017-11-26 MED ORDER — 0.9 % SODIUM CHLORIDE (POUR BTL) OPTIME
TOPICAL | Status: DC | PRN
  Administered 2017-11-26: 1000 mL

## 2017-11-26 MED ORDER — HYDROCODONE-ACETAMINOPHEN 5-325 MG PO TABS: 1.0000 | ORAL_TABLET | ORAL | 0 refills | Status: DC | PRN

## 2017-11-26 MED ORDER — LIDOCAINE 2% (20 MG/ML) 5 ML SYRINGE
INTRAMUSCULAR | Status: AC
  Filled 2017-11-26: qty 5

## 2017-11-26 MED ORDER — FENTANYL CITRATE (PF) 100 MCG/2ML IJ SOLN
INTRAMUSCULAR | Status: DC | PRN
  Administered 2017-11-26: 50 ug via INTRAVENOUS

## 2017-11-26 MED ORDER — SODIUM CHLORIDE 0.9 % IV SOLN
INTRAVENOUS | Status: DC | PRN
  Administered 2017-11-26: 09:00:00 via INTRAVENOUS

## 2017-11-26 MED ORDER — ONDANSETRON HCL 4 MG/2ML IJ SOLN: 4.0000 mg | Freq: Once | INTRAMUSCULAR | Status: DC | PRN

## 2017-11-26 MED ORDER — LIDOCAINE HCL (CARDIAC) PF 100 MG/5ML IV SOSY
PREFILLED_SYRINGE | INTRAVENOUS | Status: DC | PRN
  Administered 2017-11-26: 40 mg via INTRATRACHEAL

## 2017-11-26 MED ORDER — MIDAZOLAM HCL 2 MG/2ML IJ SOLN
INTRAMUSCULAR | Status: AC
  Filled 2017-11-26: qty 2

## 2017-11-26 MED ORDER — LIDOCAINE HCL 1 % IJ SOLN
INTRAMUSCULAR | Status: DC | PRN
  Administered 2017-11-26: 5 mL

## 2017-11-26 MED ORDER — CARVEDILOL 12.5 MG PO TABS
12.5000 mg | ORAL_TABLET | Freq: Once | ORAL | Status: AC
Start: 2017-11-26 — End: 2017-11-26
  Administered 2017-11-26: 12.5 mg via ORAL

## 2017-11-26 MED ORDER — CEFAZOLIN SODIUM-DEXTROSE 2-4 GM/100ML-% IV SOLN
2.0000 g | INTRAVENOUS | Status: AC
  Administered 2017-11-26: 2 g via INTRAVENOUS
  Filled 2017-11-26: qty 100

## 2017-11-26 MED ORDER — ONDANSETRON HCL 4 MG/2ML IJ SOLN
INTRAMUSCULAR | Status: DC | PRN
  Administered 2017-11-26: 4 mg via INTRAVENOUS

## 2017-11-26 MED ORDER — BUPIVACAINE HCL (PF) 0.25 % IJ SOLN
INTRAMUSCULAR | Status: AC
  Filled 2017-11-26: qty 10

## 2017-11-26 MED ORDER — LIDOCAINE 2% (20 MG/ML) 5 ML SYRINGE
INTRAMUSCULAR | Status: AC
  Filled 2017-11-26: qty 20

## 2017-11-26 MED ORDER — CARVEDILOL 12.5 MG PO TABS
ORAL_TABLET | ORAL | Status: AC
  Filled 2017-11-26: qty 1

## 2017-11-26 MED ORDER — PROPOFOL 10 MG/ML IV BOLUS
INTRAVENOUS | Status: AC
  Filled 2017-11-26: qty 20

## 2017-11-26 MED ORDER — FENTANYL CITRATE (PF) 250 MCG/5ML IJ SOLN
INTRAMUSCULAR | Status: AC
  Filled 2017-11-26: qty 5

## 2017-11-26 MED ORDER — BUPIVACAINE HCL 0.25 % IJ SOLN
INTRAMUSCULAR | Status: DC | PRN
  Administered 2017-11-26: 4 mL

## 2017-11-26 MED ORDER — ONDANSETRON HCL 4 MG/2ML IJ SOLN
INTRAMUSCULAR | Status: AC
  Filled 2017-11-26: qty 2

## 2017-11-26 MED ORDER — LIDOCAINE HCL 1 % IJ SOLN
INTRAMUSCULAR | Status: AC
  Filled 2017-11-26: qty 20

## 2017-11-26 MED ORDER — SODIUM CHLORIDE 0.9 % IV SOLN
INTRAVENOUS | Status: DC
  Administered 2017-11-26: 10:00:00 via INTRAVENOUS

## 2017-11-26 MED ORDER — FENTANYL CITRATE (PF) 100 MCG/2ML IJ SOLN: 25.0000 ug | INTRAMUSCULAR | Status: DC | PRN

## 2017-11-26 MED ORDER — PROPOFOL 1000 MG/100ML IV EMUL
INTRAVENOUS | Status: AC
  Filled 2017-11-26: qty 100

## 2017-11-26 SURGICAL SUPPLY — 43 items
BANDAGE ACE 3X5.8 VEL STRL LF (GAUZE/BANDAGES/DRESSINGS) ×3 IMPLANT
BANDAGE ACE 4X5 VEL STRL LF (GAUZE/BANDAGES/DRESSINGS) IMPLANT
BNDG GAUZE ELAST 4 BULKY (GAUZE/BANDAGES/DRESSINGS) IMPLANT
CORDS BIPOLAR (ELECTRODE) ×3 IMPLANT
COVER SURGICAL LIGHT HANDLE (MISCELLANEOUS) ×3 IMPLANT
CUFF TOURN SGL LL 12 NO SLV (MISCELLANEOUS) ×3 IMPLANT
CUFF TOURNIQUET SINGLE 18IN (TOURNIQUET CUFF) ×3 IMPLANT
CUFF TOURNIQUET SINGLE 24IN (TOURNIQUET CUFF) IMPLANT
DRAPE SURG 17X23 STRL (DRAPES) ×2 IMPLANT
DRAPE U-SHAPE 47X51 STRL (DRAPES) IMPLANT
DURAPREP 26ML APPLICATOR (WOUND CARE) ×3 IMPLANT
GAUZE SPONGE 4X4 12PLY STRL (GAUZE/BANDAGES/DRESSINGS) ×3 IMPLANT
GAUZE SPONGE 4X4 12PLY STRL LF (GAUZE/BANDAGES/DRESSINGS) ×3 IMPLANT
GAUZE XEROFORM 1X8 LF (GAUZE/BANDAGES/DRESSINGS) ×3 IMPLANT
GLOVE BIO SURGEON STRL SZ8 (GLOVE) IMPLANT
GLOVE BIOGEL PI IND STRL 8 (GLOVE) ×2 IMPLANT
GLOVE BIOGEL PI INDICATOR 8 (GLOVE) ×4
GLOVE ORTHO TXT STRL SZ7.5 (GLOVE) ×1 IMPLANT
GLOVE SURG SS PI 8.0 STRL IVOR (GLOVE) ×6 IMPLANT
GOWN STRL REUS W/ TWL LRG LVL3 (GOWN DISPOSABLE) IMPLANT
GOWN STRL REUS W/ TWL XL LVL3 (GOWN DISPOSABLE) ×2 IMPLANT
GOWN STRL REUS W/TWL LRG LVL3 (GOWN DISPOSABLE)
GOWN STRL REUS W/TWL XL LVL3 (GOWN DISPOSABLE) ×6
KIT BASIN OR (CUSTOM PROCEDURE TRAY) ×3 IMPLANT
KIT TURNOVER KIT B (KITS) ×3 IMPLANT
NEEDLE 22X1 1/2 (OR ONLY) (NEEDLE) ×4 IMPLANT
NS IRRIG 1000ML POUR BTL (IV SOLUTION) ×3 IMPLANT
PACK ORTHO EXTREMITY (CUSTOM PROCEDURE TRAY) ×3 IMPLANT
PAD ARMBOARD 7.5X6 YLW CONV (MISCELLANEOUS) ×6 IMPLANT
PAD CAST 4YDX4 CTTN HI CHSV (CAST SUPPLIES) ×1 IMPLANT
PADDING CAST COTTON 4X4 STRL (CAST SUPPLIES) ×3
PADDING CAST SYNTHETIC 2 (CAST SUPPLIES) ×2
PADDING CAST SYNTHETIC 2X4 NS (CAST SUPPLIES) IMPLANT
SUCTION FRAZIER HANDLE 10FR (MISCELLANEOUS)
SUCTION TUBE FRAZIER 10FR DISP (MISCELLANEOUS) IMPLANT
SUT ETHILON 3 0 PS 1 (SUTURE) ×5 IMPLANT
SUT ETHILON 4 0 PS 2 18 (SUTURE) IMPLANT
SYR CONTROL 10ML LL (SYRINGE) ×4 IMPLANT
TOWEL OR 17X26 10 PK STRL BLUE (TOWEL DISPOSABLE) ×3 IMPLANT
TUBE CONNECTING 12'X1/4 (SUCTIONS)
TUBE CONNECTING 12X1/4 (SUCTIONS) IMPLANT
UNDERPAD 30X30 (UNDERPADS AND DIAPERS) ×3 IMPLANT
WATER STERILE IRR 1000ML POUR (IV SOLUTION) ×3 IMPLANT

## 2017-11-26 NOTE — Progress Notes (Signed)
Dr Roanna Banning called about where to place IV states ok to use upper left  arm.

## 2017-11-26 NOTE — Anesthesia Preprocedure Evaluation (Addendum)
Anesthesia Evaluation  Patient identified by MRN, date of birth, ID band Patient awake    Reviewed: Allergy & Precautions, NPO status , Patient's Chart, lab work & pertinent test results, reviewed documented beta blocker date and time   Airway Mallampati: I  TM Distance: >3 FB Neck ROM: Full    Dental no notable dental hx.    Pulmonary neg pulmonary ROS,    Pulmonary exam normal breath sounds clear to auscultation       Cardiovascular hypertension, Pt. on medications and Pt. on home beta blockers +CHF  Normal cardiovascular exam+ Valvular Problems/Murmurs AS and MR  Rhythm:Regular Rate:Normal  ECG: NSR, rate 72  ECHO: Left ventricle: The cavity size was normal. There was moderate concentric hypertrophy. Systolic function was mildly reduced. The estimated ejection fraction was in the range of 45% to 50%. Diffuse hypokinesis. Features are consistent with a pseudonormal left ventricular filling pattern, with concomitant abnormal relaxation and increased filling pressure (grade 2 diastolic dysfunction). Aortic valve: Valve mobility was restricted. There was mild stenosis. Peak velocity (S): 238 cm/s. Mean gradient (S): 13 mm Hg. Mitral valve: Severely calcified annulus. There was moderate regurgitation directed posteriorly. Left atrium: The atrium was moderately dilated. Volume/bsa, ES, (1-plane Simpson&'s, A2C): 41.5 ml/m^2. Right ventricle: The cavity size was moderately dilated. Wall thickness was normal. Right atrium: The atrium was moderately dilated. Tricuspid valve: There was moderate regurgitation. Pulmonary arteries: Systolic pressure was moderately increased. PA peak pressure: 64 mm Hg (S).    Neuro/Psych  Headaches, Anxiety Blind    GI/Hepatic Neg liver ROS, GERD  Medicated and Controlled,  Endo/Other  diabetes, Insulin Dependent  Renal/GU Renal disease     Musculoskeletal negative musculoskeletal ROS (+)    Abdominal (+) + obese,   Peds  Hematology negative hematology ROS (+)   Anesthesia Other Findings left carpal tunnel syndrome  Reproductive/Obstetrics                            Anesthesia Physical Anesthesia Plan  ASA: IV  Anesthesia Plan: MAC   Post-op Pain Management:    Induction:   PONV Risk Score and Plan: 2 and Ondansetron and Treatment may vary due to age or medical condition  Airway Management Planned: Simple Face Mask  Additional Equipment:   Intra-op Plan:   Post-operative Plan:   Informed Consent: I have reviewed the patients History and Physical, chart, labs and discussed the procedure including the risks, benefits and alternatives for the proposed anesthesia with the patient or authorized representative who has indicated his/her understanding and acceptance.   Dental advisory given  Plan Discussed with: CRNA  Anesthesia Plan Comments:        Anesthesia Quick Evaluation

## 2017-11-26 NOTE — Brief Op Note (Signed)
11/26/2017  10:49 AM  PATIENT:  [name]  67 y.o. female  PRE-OPERATIVE DIAGNOSIS:  left carpal tunnel syndrome  POST-OPERATIVE DIAGNOSIS:  left carpal tunnel syndrome  PROCEDURE:  Procedure(s): LEFT CARPAL TUNNEL RELEASE (Left)  SURGEON:  Surgeon(s) and Role:    [name], MD - Primary  PHYSICIAN ASSISTANT: Benita Stabile, PA-C  ANESTHESIA:   local and MAC  EBL:  5 mL   COUNTS:  YES  TOURNIQUET:   Total Tourniquet Time Documented: Forearm (Left) - 9 minutes Total: Forearm (Left) - 9 minutes   DICTATION: .Other Dictation: Dictation Number [id number]  PLAN OF CARE: Discharge to home after PACU  PATIENT DISPOSITION:  PACU - hemodynamically stable.   Delay start of Pharmacological VTE agent (>24hrs) due to surgical blood loss or risk of bleeding: no

## 2017-11-26 NOTE — Anesthesia Procedure Notes (Signed)
Procedure Name: MAC Date/Time: 11/26/2017 10:10 AM Performed by: Neldon Newport, CRNA Pre-anesthesia Checklist: Timeout performed, Patient being monitored, Suction available, Emergency Drugs available and Patient identified Patient Re-evaluated:Patient Re-evaluated prior to induction Oxygen Delivery Method: Simple face mask

## 2017-11-26 NOTE — Discharge Instructions (Addendum)
Expect hand pain and swelling as well as numbness. Ice and elevation as needed. Keep your incision and dressing clean and dry. Leave this current dressing on and in place for the next 6 days. In 6 days, you can remove your dressings and get your incision wet daily in the shower. In 6 days, you should put large band-aids over your incision daily to protect the sutures.

## 2017-11-26 NOTE — Op Note (Signed)
[name], [name] MEDICAL RECORD [id number] ACCOUNT [id number] DATE OF BIRTH:09/15/1950 FACILITY: [facility] LOCATION: [facility] PHYSICIAN:Hikari Tripp Kerry Fort, MD  OPERATIVE REPORT  DATE OF PROCEDURE:  11/26/2017  PREOPERATIVE DIAGNOSIS:  Severe carpal tunnel syndrome, left upper extremity.  POSTOPERATIVE DIAGNOSIS:  Severe carpal tunnel syndrome, left upper extremity.  PROCEDURE:  Left open carpal tunnel release.  SURGEON:  Lind Guest. Ninfa Linden, MD  ASSISTANT:   Benita Stabile, PA-C  ANESTHESIA: 1.  Mask ventilation, IV sedation. 2.  Local with 1% plain lidocaine followed by 0.25% plain Marcaine.  ANTIBIOTICS:  Two grams IV Ancef.  TOURNIQUET TIME:  Less than 15 minutes.  ESTIMATED BLOOD LOSS:  Minimal.  COMPLICATIONS:  None.  INDICATIONS:  The patient is a 67 year old diabetic patient who has end-stage renal disease on dialysis.  She has peripheral neuropathy from her diabetes, but also has significant and severe carpal tunnel syndrome of her left upper extremity, confirmed  with nerve conduction velocity studies.  At this point, we have recommended open carpal tunnel release given the nature of her disease.  She is still going to work on appropriate blood glucose control, but again with the tightness of the transverse  carpal ligament, this surgery is definitely warranted.  DESCRIPTION OF PROCEDURE:  After informed consent was obtained, the appropriate left wrist was marked.  She was brought to the operating room and placed supine on the operating table with left arm on the arm table.  Mask ventilation, IV sedation was  obtained with a prep of the left hand with DuraPrep and sterile drapes and the usual forearm tourniquet.  We wrapped out her hand with an Esmarch and tourniquet was inflated to 250 mm of pressure.  Again, we had already made a timeout and identified as  the correct arm, left arm and the correct patient.  We then anesthetized the skin and deep tissues,  first with plain lidocaine followed by then plain Marcaine.  We then made an incision over the transverse carpal ligament in the palm of the hand and  carried this proximally and distally.  We were able to dissect down to the distal edge of the transverse carpal ligament and then slowly and meticulously divided it, as we protected the median nerve.  There was significant synovitis in the carpal canal  as well.  We then irrigated the soft tissues with normal saline solution and reapproximated the skin with interrupted 3-0 nylon suture.  Xeroform well-padded sterile dressing was applied.  The tourniquet was let down prior to [place] closing.  Hemostasis was  obtained with electrocautery and her fingers were well perfused afterwards.    She was taken to recovery room in stable condition.  All final counts correct.  There were no complications noted.  Postoperatively, she will be discharged from the PACU to home with appropriate discharge instructions and followup.  AN/NUANCE  D:11/26/2017 T:11/26/2017 JOB:[id number]

## 2017-11-26 NOTE — H&P (Signed)
[name] is an 67 y.o. female.   Chief Complaint: Known severe left severe carpal tunnel syndrome HPI: The patient is a 67 year old with end-stage renal disease and diabetes who does have severe carpal tunnel syndrome involving her left upper extremity.  This was confirmed with nerve conduction studies and it does show compressive neuropathy at the transverse carpal ligament.  There is a component of this being a peripheral neuropathy from her diabetes as well.  Given her decreased hand strength and weakness as well as the persistent numbness and tingling and pain she with a carpal tunnel release on the left upper extremity.  Past Medical History:  Diagnosis Date  . Anemia   . Anxiety   . Arthritis    "joints" (06/15/2013)  . Blood transfusion without reported diagnosis   . Breast cancer ([place])    left  . CHF (congestive heart failure) ([place])   . ESRD (end stage renal disease) ([place])    "suppose to start dialysis today" (06/15/2013)  . GERD (gastroesophageal reflux disease)   . Hypertension   . Myalgia 12/31/2011  . Neuropathy 12/31/2011  . Shortness of breath    "when she doesn't go to dialysis"  . Type II diabetes mellitus ([place])     Past Surgical History:  Procedure Laterality Date  . ABDOMINAL HYSTERECTOMY     partial  . AV FISTULA PLACEMENT Right 06/06/2013   Procedure: ARTERIOVENOUS (AV) FISTULA CREATION-RIGHT BRACHIAL CEPHALIC;  Surgeon: Conrad Aztec, MD;  Location: [facility];  Service: Vascular;  Laterality: Right;  . BREAST BIOPSY Left   . BREAST LUMPECTOMY Left    "and took out some lymph nodes" (06/15/2013)  . CATARACT EXTRACTION W/ ANTERIOR VITRECTOMY Bilateral   . CESAREAN SECTION  1980  . ESOPHAGOGASTRODUODENOSCOPY N/A 07/08/2017   Procedure: ESOPHAGOGASTRODUODENOSCOPY (EGD);  Surgeon: [name], MD;  Location[facility] ENDOSCOPY;  Service: Endoscopy;  Laterality: N/A;  . EYE SURGERY Bilateral    laser surgery  . PARS PLANA VITRECTOMY Right 05/05/2017   Procedure:  PARS PLANA VITRECTOMY WITH 25 GAUGE; PARTIAL REMOVAL OF OIL; INFERIOR PERIPHERAL IRIDECTOMY, REFORM ANTERIOR CHAMBER RIGHT EYE;  Surgeon: [name], MD;  Location: [facility];  Service: Ophthalmology;  Laterality: Right;  . REFRACTIVE SURGERY Bilateral   . REMOVAL OF A DIALYSIS CATHETER Right 06/06/2013   Procedure: REMOVAL OF RIGHT MEDIPORT;  Surgeon: Conrad Sanford, MD;  Location: [facility];  Service: Vascular;  Laterality: Right;  . TONSILLECTOMY      Family History  Problem Relation Age of Onset  . Diabetes Mother   . Hyperlipidemia Mother   . Hypertension Mother   . Hypertension Father   . Diabetes Sister   . Diabetes Brother   . Hypertension Brother   . Heart attack Brother   . Kidney disease Brother   . Colon cancer Neg Hx   . Colon polyps Neg Hx   . Esophageal cancer Neg Hx   . Gallbladder disease Neg Hx    Social History:  reports that she has never smoked. She has never used smokeless tobacco. She reports that she does not drink alcohol or use drugs.  Allergies:  Allergies  Allergen Reactions  . Latex Hives  . Oxycodone Other (See Comments)    Hallucinations   . Tramadol Other (See Comments)    hallucinations   . Tape Rash    19M Transpore adhesive tape. Medical tape  . Vicodin [Hydrocodone-Acetaminophen] Other (See Comments)    hallucinations    Medications Prior  to Admission  Medication Sig Dispense Refill  . acetaminophen (TYLENOL) 500 MG tablet Take 500 mg by mouth 2 (two) times daily.    Marland Kitchen amLODipine (NORVASC) 10 MG tablet Take 10 mg at bedtime by mouth.  0  . aspirin 81 MG chewable tablet Chew 81 mg by mouth daily with lunch.     Marland Kitchen atropine 1 % ophthalmic solution Place 1 drop into the right eye 2 (two) times daily.  0  . B Complex-C-Folic Acid (RENA-VITE RX) 1 MG TABS Take 1 tablet by mouth daily with lunch.   1  . carvedilol (COREG) 12.5 MG tablet Take 12.5 mg by mouth 2 (two) times daily with a meal.    . cloNIDine (CATAPRES) 0.2 MG tablet Take 0.2 mg by  mouth 2 (two) times daily. with lunch and at bedtime    . COMBIGAN 0.2-0.5 % ophthalmic solution     . darbepoetin (ARANESP) 200 MCG/0.4ML SOLN injection Inject 0.4 mLs (200 mcg total) into the vein every Wednesday with hemodialysis. 1.68 mL   . diphenhydrAMINE (BENADRYL) 25 MG tablet Take 12.5 mg by mouth every Monday, Wednesday, and Friday.    . docusate sodium (COLACE) 100 MG capsule Take 100 mg by mouth 2 (two) times daily.     Marland Kitchen doxercalciferol (HECTOROL) 4 MCG/2ML injection Inject 0.5 mLs (1 mcg total) into the vein every Monday, Wednesday, and Friday with hemodialysis. 2 mL   . fluticasone (FLONASE) 50 MCG/ACT nasal spray Place 1-2 sprays into both nostrils daily as needed for allergies or rhinitis.     Marland Kitchen gabapentin (NEURONTIN) 100 MG capsule Take 100 mg by mouth 2 (two) times daily.    . insulin aspart (NOVOLOG FLEXPEN) 100 UNIT/ML FlexPen Inject 10-16 Units into the skin See admin instructions. Sliding scale     . lanthanum (FOSRENOL) 1000 MG chewable tablet Chew 2,000 mg by mouth 3 (three) times daily with meals. Take 1 tablet with snack    . lisinopril (PRINIVIL,ZESTRIL) 40 MG tablet Take 40 mg by mouth at bedtime.     Marland Kitchen omeprazole (PRILOSEC) 40 MG capsule Take 40 mg by mouth daily.    . prednisoLONE acetate (PRED FORTE) 1 % ophthalmic suspension Place 1 drop into the right eye 4 (four) times daily.  0  . albuterol (PROVENTIL) (2.5 MG/3ML) 0.083% nebulizer solution Take 3 mLs (2.5 mg total) by nebulization every 6 (six) hours as needed for wheezing or shortness of breath. 75 mL 12  . benzonatate (TESSALON PERLES) 100 MG capsule Take 1 capsule (100 mg total) by mouth 3 (three) times daily as needed for cough. (Patient not taking: Reported on 11/24/2017) 20 capsule 0  . pantoprazole (PROTONIX) 40 MG tablet Take 1 tablet (40 mg total) by mouth 2 (two) times daily. Take 1 tablet 2 times daily for 14 days and then 1 tablet daily (Patient not taking: Reported on 11/24/2017) 60 tablet 0  . sennosides  (SENOKOT) 8.8 MG/5ML syrup Take 5 mLs by mouth 2 (two) times daily. (Patient not taking: Reported on 11/24/2017) 240 mL 0    Results for orders placed or performed during the hospital encounter of 11/26/17 (from the past 48 hour(s))  Glucose, capillary     Status: Abnormal   Collection Time: 11/26/17  9:09 AM  Result Value Ref Range   Glucose-Capillary 125 (H) 65 - 99 mg/dL   Comment 1 Notify RN   I-STAT 4, (NA,K, GLUC, HGB,HCT)     Status: Abnormal   Collection Time: 11/26/17  9:31  AM  Result Value Ref Range   Sodium 137 135 - 145 mmol/L   Potassium 4.6 3.5 - 5.1 mmol/L   Glucose, Bld 122 (H) 65 - 99 mg/dL   HCT 40.0 36.0 - 46.0 %   Hemoglobin 13.6 12.0 - 15.0 g/dL   No results found.  Review of Systems  All other systems reviewed and are negative.   Blood pressure (!) 198/72, pulse 81, temperature 98.1 F (36.7 C), temperature source Oral, resp. rate 20, height 5\' 5"  (1.651 m), weight 192 lb (87.1 kg), SpO2 98 %. Physical Exam  Constitutional: She is oriented to person, place, and time. She appears well-developed and well-nourished.  HENT:  Head: Normocephalic and atraumatic.  Eyes: Pupils are equal, round, and reactive to light.  Neck: Normal range of motion.  Cardiovascular: Normal rate.  Respiratory: Effort normal.  GI: Soft.  Musculoskeletal:       Left hand: Decreased sensation noted. Decreased sensation is present in the medial distribution. Decreased strength noted.  Neurological: She is alert and oriented to person, place, and time.  Skin: Skin is warm and dry.  Psychiatric: She has a normal mood and affect.     Assessment/Plan Severe left carpal tunnel syndrome  The plan is to proceed to surgery today as an outpatient for a left open carpal tunnel release.  This should help her symptoms significantly in terms of her numbness and tingling and weakness however she understands that there is a component of this being a peripheral neuropathy as it relates to her being  a long-term diabetic.  She understands importance of good glucose control.  Risk and benefits of surgery been explained in detail pain and informed consent is obtained.  [name], MD 11/26/2017, 9:52 AM

## 2017-11-26 NOTE — Anesthesia Postprocedure Evaluation (Signed)
Anesthesia Post Note  Patient: [name]  Procedure(s) Performed: LEFT CARPAL TUNNEL RELEASE (Left Wrist)     Patient location during evaluation: PACU Anesthesia Type: MAC Level of consciousness: awake and alert Pain management: pain level controlled Vital Signs Assessment: post-procedure vital signs reviewed and stable Respiratory status: spontaneous breathing, nonlabored ventilation, respiratory function stable and patient connected to nasal cannula oxygen Cardiovascular status: stable and blood pressure returned to baseline Postop Assessment: no apparent nausea or vomiting Anesthetic complications: no    Last Vitals:  Vitals:   11/26/17 1130 11/26/17 1153  BP:  (!) 178/68  Pulse:  76  Resp:  12  Temp:  (!) 36.3 C  SpO2: 94% 94%    Last Pain:  Vitals:   11/26/17 1130  TempSrc:   PainSc: 0-No pain                 Evalyn Shultis P Curly Mackowski

## 2017-11-26 NOTE — Transfer of Care (Signed)
Immediate Anesthesia Transfer of Care Note  Patient: [name]  Procedure(s) Performed: LEFT CARPAL TUNNEL RELEASE (Left Wrist)  Patient Location: PACU  Anesthesia Type:MAC  Level of Consciousness: awake, alert  and oriented  Airway & Oxygen Therapy: [name]  Post-op Assessment: Report given to RN, Post -op Vital signs reviewed and stable and Patient moving all extremities X 4  Post vital signs: Reviewed and stable  Last Vitals:  Vitals Value Taken Time  BP 167/60 11/26/2017 10:59 AM  Temp    Pulse 76 11/26/2017 10:59 AM  Resp 11 11/26/2017 10:59 AM  SpO2 96 % 11/26/2017 10:59 AM  Vitals shown include unvalidated device data.  Last Pain:  Vitals:   11/26/17 0908  TempSrc: Oral         Complications: No apparent anesthesia complications

## 2017-11-27 ENCOUNTER — Encounter (HOSPITAL_COMMUNITY): Payer: Self-pay | Admitting: Orthopaedic Surgery

## 2017-11-27 DIAGNOSIS — Z992 Dependence on renal dialysis: Secondary | ICD-10-CM | POA: Diagnosis not present

## 2017-11-27 DIAGNOSIS — K2211 Ulcer of esophagus with bleeding: Secondary | ICD-10-CM | POA: Diagnosis not present

## 2017-11-27 DIAGNOSIS — D631 Anemia in chronic kidney disease: Secondary | ICD-10-CM | POA: Diagnosis not present

## 2017-11-27 DIAGNOSIS — N2581 Secondary hyperparathyroidism of renal origin: Secondary | ICD-10-CM | POA: Diagnosis not present

## 2017-11-27 DIAGNOSIS — D509 Iron deficiency anemia, unspecified: Secondary | ICD-10-CM | POA: Diagnosis not present

## 2017-11-27 DIAGNOSIS — N186 End stage renal disease: Secondary | ICD-10-CM | POA: Diagnosis not present

## 2017-11-30 DIAGNOSIS — D631 Anemia in chronic kidney disease: Secondary | ICD-10-CM | POA: Diagnosis not present

## 2017-11-30 DIAGNOSIS — N186 End stage renal disease: Secondary | ICD-10-CM | POA: Diagnosis not present

## 2017-11-30 DIAGNOSIS — N2581 Secondary hyperparathyroidism of renal origin: Secondary | ICD-10-CM | POA: Diagnosis not present

## 2017-11-30 DIAGNOSIS — D509 Iron deficiency anemia, unspecified: Secondary | ICD-10-CM | POA: Diagnosis not present

## 2017-11-30 DIAGNOSIS — K2211 Ulcer of esophagus with bleeding: Secondary | ICD-10-CM | POA: Diagnosis not present

## 2017-11-30 DIAGNOSIS — Z992 Dependence on renal dialysis: Secondary | ICD-10-CM | POA: Diagnosis not present

## 2017-12-01 ENCOUNTER — Encounter: Payer: Self-pay | Admitting: Nephrology

## 2017-12-02 DIAGNOSIS — K2211 Ulcer of esophagus with bleeding: Secondary | ICD-10-CM | POA: Diagnosis not present

## 2017-12-02 DIAGNOSIS — D509 Iron deficiency anemia, unspecified: Secondary | ICD-10-CM | POA: Diagnosis not present

## 2017-12-02 DIAGNOSIS — Z992 Dependence on renal dialysis: Secondary | ICD-10-CM | POA: Diagnosis not present

## 2017-12-02 DIAGNOSIS — N2581 Secondary hyperparathyroidism of renal origin: Secondary | ICD-10-CM | POA: Diagnosis not present

## 2017-12-02 DIAGNOSIS — N186 End stage renal disease: Secondary | ICD-10-CM | POA: Diagnosis not present

## 2017-12-02 DIAGNOSIS — D631 Anemia in chronic kidney disease: Secondary | ICD-10-CM | POA: Diagnosis not present

## 2017-12-04 DIAGNOSIS — N186 End stage renal disease: Secondary | ICD-10-CM | POA: Diagnosis not present

## 2017-12-04 DIAGNOSIS — D631 Anemia in chronic kidney disease: Secondary | ICD-10-CM | POA: Diagnosis not present

## 2017-12-04 DIAGNOSIS — N2581 Secondary hyperparathyroidism of renal origin: Secondary | ICD-10-CM | POA: Diagnosis not present

## 2017-12-04 DIAGNOSIS — K2211 Ulcer of esophagus with bleeding: Secondary | ICD-10-CM | POA: Diagnosis not present

## 2017-12-04 DIAGNOSIS — D509 Iron deficiency anemia, unspecified: Secondary | ICD-10-CM | POA: Diagnosis not present

## 2017-12-04 DIAGNOSIS — Z992 Dependence on renal dialysis: Secondary | ICD-10-CM | POA: Diagnosis not present

## 2017-12-05 DIAGNOSIS — D631 Anemia in chronic kidney disease: Secondary | ICD-10-CM | POA: Diagnosis not present

## 2017-12-05 DIAGNOSIS — N2581 Secondary hyperparathyroidism of renal origin: Secondary | ICD-10-CM | POA: Diagnosis not present

## 2017-12-05 DIAGNOSIS — N186 End stage renal disease: Secondary | ICD-10-CM | POA: Diagnosis not present

## 2017-12-05 DIAGNOSIS — K2211 Ulcer of esophagus with bleeding: Secondary | ICD-10-CM | POA: Diagnosis not present

## 2017-12-05 DIAGNOSIS — D509 Iron deficiency anemia, unspecified: Secondary | ICD-10-CM | POA: Diagnosis not present

## 2017-12-05 DIAGNOSIS — Z992 Dependence on renal dialysis: Secondary | ICD-10-CM | POA: Diagnosis not present

## 2017-12-07 DIAGNOSIS — Z992 Dependence on renal dialysis: Secondary | ICD-10-CM | POA: Diagnosis not present

## 2017-12-07 DIAGNOSIS — N2581 Secondary hyperparathyroidism of renal origin: Secondary | ICD-10-CM | POA: Diagnosis not present

## 2017-12-07 DIAGNOSIS — N186 End stage renal disease: Secondary | ICD-10-CM | POA: Diagnosis not present

## 2017-12-07 DIAGNOSIS — D631 Anemia in chronic kidney disease: Secondary | ICD-10-CM | POA: Diagnosis not present

## 2017-12-07 DIAGNOSIS — K2211 Ulcer of esophagus with bleeding: Secondary | ICD-10-CM | POA: Diagnosis not present

## 2017-12-07 DIAGNOSIS — D509 Iron deficiency anemia, unspecified: Secondary | ICD-10-CM | POA: Diagnosis not present

## 2017-12-09 DIAGNOSIS — N2581 Secondary hyperparathyroidism of renal origin: Secondary | ICD-10-CM | POA: Diagnosis not present

## 2017-12-09 DIAGNOSIS — D631 Anemia in chronic kidney disease: Secondary | ICD-10-CM | POA: Diagnosis not present

## 2017-12-09 DIAGNOSIS — K2211 Ulcer of esophagus with bleeding: Secondary | ICD-10-CM | POA: Diagnosis not present

## 2017-12-09 DIAGNOSIS — N186 End stage renal disease: Secondary | ICD-10-CM | POA: Diagnosis not present

## 2017-12-09 DIAGNOSIS — D509 Iron deficiency anemia, unspecified: Secondary | ICD-10-CM | POA: Diagnosis not present

## 2017-12-09 DIAGNOSIS — Z992 Dependence on renal dialysis: Secondary | ICD-10-CM | POA: Diagnosis not present

## 2017-12-10 ENCOUNTER — Ambulatory Visit (INDEPENDENT_AMBULATORY_CARE_PROVIDER_SITE_OTHER): Payer: Medicare Other | Admitting: Orthopaedic Surgery

## 2017-12-10 ENCOUNTER — Encounter (INDEPENDENT_AMBULATORY_CARE_PROVIDER_SITE_OTHER): Payer: Self-pay | Admitting: Orthopaedic Surgery

## 2017-12-10 DIAGNOSIS — Z9889 Other specified postprocedural states: Secondary | ICD-10-CM | POA: Insufficient documentation

## 2017-12-10 NOTE — Progress Notes (Signed)
Patient is now 2 weeks status post a left open carpal tunnel release.  She has severe carpal tunnel syndrome on top of diabetes.  She does report good blood glucose control.  She also has end-stage renal disease and is on dialysis.  On exam she can pinch and grip with her thumb and her hand in general.  Incision well healed to the sutures removed and Steri-Strips applied.  There is no evidence of infection.  She can use a squeeze ball to work on getting her strength back and use her hand as comfort allows.  We will see her in a month to see how she is doing overall.  She wants to consider right carpal carpal surgery at some point but we will delay this until she is better from her left side.

## 2017-12-11 DIAGNOSIS — Z992 Dependence on renal dialysis: Secondary | ICD-10-CM | POA: Diagnosis not present

## 2017-12-11 DIAGNOSIS — K2211 Ulcer of esophagus with bleeding: Secondary | ICD-10-CM | POA: Diagnosis not present

## 2017-12-11 DIAGNOSIS — D509 Iron deficiency anemia, unspecified: Secondary | ICD-10-CM | POA: Diagnosis not present

## 2017-12-11 DIAGNOSIS — N186 End stage renal disease: Secondary | ICD-10-CM | POA: Diagnosis not present

## 2017-12-11 DIAGNOSIS — N2581 Secondary hyperparathyroidism of renal origin: Secondary | ICD-10-CM | POA: Diagnosis not present

## 2017-12-11 DIAGNOSIS — D631 Anemia in chronic kidney disease: Secondary | ICD-10-CM | POA: Diagnosis not present

## 2017-12-14 DIAGNOSIS — K2211 Ulcer of esophagus with bleeding: Secondary | ICD-10-CM | POA: Diagnosis not present

## 2017-12-14 DIAGNOSIS — D509 Iron deficiency anemia, unspecified: Secondary | ICD-10-CM | POA: Diagnosis not present

## 2017-12-14 DIAGNOSIS — D631 Anemia in chronic kidney disease: Secondary | ICD-10-CM | POA: Diagnosis not present

## 2017-12-14 DIAGNOSIS — Z992 Dependence on renal dialysis: Secondary | ICD-10-CM | POA: Diagnosis not present

## 2017-12-14 DIAGNOSIS — N186 End stage renal disease: Secondary | ICD-10-CM | POA: Diagnosis not present

## 2017-12-14 DIAGNOSIS — N2581 Secondary hyperparathyroidism of renal origin: Secondary | ICD-10-CM | POA: Diagnosis not present

## 2017-12-16 DIAGNOSIS — Z992 Dependence on renal dialysis: Secondary | ICD-10-CM | POA: Diagnosis not present

## 2017-12-16 DIAGNOSIS — D631 Anemia in chronic kidney disease: Secondary | ICD-10-CM | POA: Diagnosis not present

## 2017-12-16 DIAGNOSIS — N2581 Secondary hyperparathyroidism of renal origin: Secondary | ICD-10-CM | POA: Diagnosis not present

## 2017-12-16 DIAGNOSIS — D509 Iron deficiency anemia, unspecified: Secondary | ICD-10-CM | POA: Diagnosis not present

## 2017-12-16 DIAGNOSIS — N186 End stage renal disease: Secondary | ICD-10-CM | POA: Diagnosis not present

## 2017-12-16 DIAGNOSIS — K2211 Ulcer of esophagus with bleeding: Secondary | ICD-10-CM | POA: Diagnosis not present

## 2017-12-18 DIAGNOSIS — D509 Iron deficiency anemia, unspecified: Secondary | ICD-10-CM | POA: Diagnosis not present

## 2017-12-18 DIAGNOSIS — D631 Anemia in chronic kidney disease: Secondary | ICD-10-CM | POA: Diagnosis not present

## 2017-12-18 DIAGNOSIS — N186 End stage renal disease: Secondary | ICD-10-CM | POA: Diagnosis not present

## 2017-12-18 DIAGNOSIS — N2581 Secondary hyperparathyroidism of renal origin: Secondary | ICD-10-CM | POA: Diagnosis not present

## 2017-12-18 DIAGNOSIS — Z992 Dependence on renal dialysis: Secondary | ICD-10-CM | POA: Diagnosis not present

## 2017-12-18 DIAGNOSIS — K2211 Ulcer of esophagus with bleeding: Secondary | ICD-10-CM | POA: Diagnosis not present

## 2017-12-19 DIAGNOSIS — E1129 Type 2 diabetes mellitus with other diabetic kidney complication: Secondary | ICD-10-CM | POA: Diagnosis not present

## 2017-12-19 DIAGNOSIS — N186 End stage renal disease: Secondary | ICD-10-CM | POA: Diagnosis not present

## 2017-12-19 DIAGNOSIS — Z992 Dependence on renal dialysis: Secondary | ICD-10-CM | POA: Diagnosis not present

## 2017-12-21 DIAGNOSIS — D509 Iron deficiency anemia, unspecified: Secondary | ICD-10-CM | POA: Diagnosis not present

## 2017-12-21 DIAGNOSIS — K2211 Ulcer of esophagus with bleeding: Secondary | ICD-10-CM | POA: Diagnosis not present

## 2017-12-21 DIAGNOSIS — N2581 Secondary hyperparathyroidism of renal origin: Secondary | ICD-10-CM | POA: Diagnosis not present

## 2017-12-21 DIAGNOSIS — N186 End stage renal disease: Secondary | ICD-10-CM | POA: Diagnosis not present

## 2017-12-21 DIAGNOSIS — D631 Anemia in chronic kidney disease: Secondary | ICD-10-CM | POA: Diagnosis not present

## 2017-12-21 DIAGNOSIS — E1129 Type 2 diabetes mellitus with other diabetic kidney complication: Secondary | ICD-10-CM | POA: Diagnosis not present

## 2017-12-21 DIAGNOSIS — Z992 Dependence on renal dialysis: Secondary | ICD-10-CM | POA: Diagnosis not present

## 2017-12-23 DIAGNOSIS — K2211 Ulcer of esophagus with bleeding: Secondary | ICD-10-CM | POA: Diagnosis not present

## 2017-12-23 DIAGNOSIS — D509 Iron deficiency anemia, unspecified: Secondary | ICD-10-CM | POA: Diagnosis not present

## 2017-12-23 DIAGNOSIS — E1129 Type 2 diabetes mellitus with other diabetic kidney complication: Secondary | ICD-10-CM | POA: Diagnosis not present

## 2017-12-23 DIAGNOSIS — Z992 Dependence on renal dialysis: Secondary | ICD-10-CM | POA: Diagnosis not present

## 2017-12-23 DIAGNOSIS — N2581 Secondary hyperparathyroidism of renal origin: Secondary | ICD-10-CM | POA: Diagnosis not present

## 2017-12-23 DIAGNOSIS — N186 End stage renal disease: Secondary | ICD-10-CM | POA: Diagnosis not present

## 2017-12-25 DIAGNOSIS — N186 End stage renal disease: Secondary | ICD-10-CM | POA: Diagnosis not present

## 2017-12-25 DIAGNOSIS — Z992 Dependence on renal dialysis: Secondary | ICD-10-CM | POA: Diagnosis not present

## 2017-12-25 DIAGNOSIS — N2581 Secondary hyperparathyroidism of renal origin: Secondary | ICD-10-CM | POA: Diagnosis not present

## 2017-12-25 DIAGNOSIS — K2211 Ulcer of esophagus with bleeding: Secondary | ICD-10-CM | POA: Diagnosis not present

## 2017-12-25 DIAGNOSIS — D509 Iron deficiency anemia, unspecified: Secondary | ICD-10-CM | POA: Diagnosis not present

## 2017-12-25 DIAGNOSIS — E1129 Type 2 diabetes mellitus with other diabetic kidney complication: Secondary | ICD-10-CM | POA: Diagnosis not present

## 2017-12-28 DIAGNOSIS — K2211 Ulcer of esophagus with bleeding: Secondary | ICD-10-CM | POA: Diagnosis not present

## 2017-12-28 DIAGNOSIS — E1129 Type 2 diabetes mellitus with other diabetic kidney complication: Secondary | ICD-10-CM | POA: Diagnosis not present

## 2017-12-28 DIAGNOSIS — N186 End stage renal disease: Secondary | ICD-10-CM | POA: Diagnosis not present

## 2017-12-28 DIAGNOSIS — N2581 Secondary hyperparathyroidism of renal origin: Secondary | ICD-10-CM | POA: Diagnosis not present

## 2017-12-28 DIAGNOSIS — Z992 Dependence on renal dialysis: Secondary | ICD-10-CM | POA: Diagnosis not present

## 2017-12-28 DIAGNOSIS — D509 Iron deficiency anemia, unspecified: Secondary | ICD-10-CM | POA: Diagnosis not present

## 2017-12-30 DIAGNOSIS — D509 Iron deficiency anemia, unspecified: Secondary | ICD-10-CM | POA: Diagnosis not present

## 2017-12-30 DIAGNOSIS — N2581 Secondary hyperparathyroidism of renal origin: Secondary | ICD-10-CM | POA: Diagnosis not present

## 2017-12-30 DIAGNOSIS — E1129 Type 2 diabetes mellitus with other diabetic kidney complication: Secondary | ICD-10-CM | POA: Diagnosis not present

## 2017-12-30 DIAGNOSIS — N186 End stage renal disease: Secondary | ICD-10-CM | POA: Diagnosis not present

## 2017-12-30 DIAGNOSIS — K2211 Ulcer of esophagus with bleeding: Secondary | ICD-10-CM | POA: Diagnosis not present

## 2017-12-30 DIAGNOSIS — Z992 Dependence on renal dialysis: Secondary | ICD-10-CM | POA: Diagnosis not present

## 2018-01-01 DIAGNOSIS — N186 End stage renal disease: Secondary | ICD-10-CM | POA: Diagnosis not present

## 2018-01-01 DIAGNOSIS — D509 Iron deficiency anemia, unspecified: Secondary | ICD-10-CM | POA: Diagnosis not present

## 2018-01-01 DIAGNOSIS — E1129 Type 2 diabetes mellitus with other diabetic kidney complication: Secondary | ICD-10-CM | POA: Diagnosis not present

## 2018-01-01 DIAGNOSIS — K2211 Ulcer of esophagus with bleeding: Secondary | ICD-10-CM | POA: Diagnosis not present

## 2018-01-01 DIAGNOSIS — N2581 Secondary hyperparathyroidism of renal origin: Secondary | ICD-10-CM | POA: Diagnosis not present

## 2018-01-01 DIAGNOSIS — Z992 Dependence on renal dialysis: Secondary | ICD-10-CM | POA: Diagnosis not present

## 2018-01-04 DIAGNOSIS — N2581 Secondary hyperparathyroidism of renal origin: Secondary | ICD-10-CM | POA: Diagnosis not present

## 2018-01-04 DIAGNOSIS — K2211 Ulcer of esophagus with bleeding: Secondary | ICD-10-CM | POA: Diagnosis not present

## 2018-01-04 DIAGNOSIS — D509 Iron deficiency anemia, unspecified: Secondary | ICD-10-CM | POA: Diagnosis not present

## 2018-01-04 DIAGNOSIS — Z992 Dependence on renal dialysis: Secondary | ICD-10-CM | POA: Diagnosis not present

## 2018-01-04 DIAGNOSIS — N186 End stage renal disease: Secondary | ICD-10-CM | POA: Diagnosis not present

## 2018-01-04 DIAGNOSIS — E1129 Type 2 diabetes mellitus with other diabetic kidney complication: Secondary | ICD-10-CM | POA: Diagnosis not present

## 2018-01-06 DIAGNOSIS — N186 End stage renal disease: Secondary | ICD-10-CM | POA: Diagnosis not present

## 2018-01-06 DIAGNOSIS — E1129 Type 2 diabetes mellitus with other diabetic kidney complication: Secondary | ICD-10-CM | POA: Diagnosis not present

## 2018-01-06 DIAGNOSIS — Z992 Dependence on renal dialysis: Secondary | ICD-10-CM | POA: Diagnosis not present

## 2018-01-06 DIAGNOSIS — N2581 Secondary hyperparathyroidism of renal origin: Secondary | ICD-10-CM | POA: Diagnosis not present

## 2018-01-06 DIAGNOSIS — K2211 Ulcer of esophagus with bleeding: Secondary | ICD-10-CM | POA: Diagnosis not present

## 2018-01-06 DIAGNOSIS — D509 Iron deficiency anemia, unspecified: Secondary | ICD-10-CM | POA: Diagnosis not present

## 2018-01-07 ENCOUNTER — Ambulatory Visit (INDEPENDENT_AMBULATORY_CARE_PROVIDER_SITE_OTHER): Payer: Medicare Other | Admitting: Orthopaedic Surgery

## 2018-01-07 ENCOUNTER — Encounter (INDEPENDENT_AMBULATORY_CARE_PROVIDER_SITE_OTHER): Payer: Self-pay | Admitting: Orthopaedic Surgery

## 2018-01-07 DIAGNOSIS — Z9889 Other specified postprocedural states: Secondary | ICD-10-CM

## 2018-01-07 NOTE — Progress Notes (Signed)
The patient is now 6 weeks status post a left open carpal tunnel release.  She is someone who has renal disease on dialysis and is a diabetic.  Her carpal tunnel syndrome is quite severe and there is also peripheral neuropathy.  She understands that she will likely never get full recovery from this.  On examination her incision looks much better overall and appears to be healing.  She has significantly weak grip and pinch strength as well as continued numbness and tingling in both her hands.  This point she understands this can take a long time to get some recovery back and this can be 6 months to year.  She is going to hold off on her right carpal tunnel release.  This is also decided that she gets dialysis and certainly this is something we will go slow with proceeding with.  We will see her in 3 months to see how she is doing overall.

## 2018-01-08 DIAGNOSIS — E1129 Type 2 diabetes mellitus with other diabetic kidney complication: Secondary | ICD-10-CM | POA: Diagnosis not present

## 2018-01-08 DIAGNOSIS — N2581 Secondary hyperparathyroidism of renal origin: Secondary | ICD-10-CM | POA: Diagnosis not present

## 2018-01-08 DIAGNOSIS — K2211 Ulcer of esophagus with bleeding: Secondary | ICD-10-CM | POA: Diagnosis not present

## 2018-01-08 DIAGNOSIS — D509 Iron deficiency anemia, unspecified: Secondary | ICD-10-CM | POA: Diagnosis not present

## 2018-01-08 DIAGNOSIS — Z992 Dependence on renal dialysis: Secondary | ICD-10-CM | POA: Diagnosis not present

## 2018-01-08 DIAGNOSIS — N186 End stage renal disease: Secondary | ICD-10-CM | POA: Diagnosis not present

## 2018-01-11 DIAGNOSIS — Z992 Dependence on renal dialysis: Secondary | ICD-10-CM | POA: Diagnosis not present

## 2018-01-11 DIAGNOSIS — N2581 Secondary hyperparathyroidism of renal origin: Secondary | ICD-10-CM | POA: Diagnosis not present

## 2018-01-11 DIAGNOSIS — D509 Iron deficiency anemia, unspecified: Secondary | ICD-10-CM | POA: Diagnosis not present

## 2018-01-11 DIAGNOSIS — K2211 Ulcer of esophagus with bleeding: Secondary | ICD-10-CM | POA: Diagnosis not present

## 2018-01-11 DIAGNOSIS — N186 End stage renal disease: Secondary | ICD-10-CM | POA: Diagnosis not present

## 2018-01-11 DIAGNOSIS — E1129 Type 2 diabetes mellitus with other diabetic kidney complication: Secondary | ICD-10-CM | POA: Diagnosis not present

## 2018-01-13 DIAGNOSIS — N186 End stage renal disease: Secondary | ICD-10-CM | POA: Diagnosis not present

## 2018-01-13 DIAGNOSIS — Z992 Dependence on renal dialysis: Secondary | ICD-10-CM | POA: Diagnosis not present

## 2018-01-13 DIAGNOSIS — N2581 Secondary hyperparathyroidism of renal origin: Secondary | ICD-10-CM | POA: Diagnosis not present

## 2018-01-13 DIAGNOSIS — D509 Iron deficiency anemia, unspecified: Secondary | ICD-10-CM | POA: Diagnosis not present

## 2018-01-13 DIAGNOSIS — K2211 Ulcer of esophagus with bleeding: Secondary | ICD-10-CM | POA: Diagnosis not present

## 2018-01-13 DIAGNOSIS — E1129 Type 2 diabetes mellitus with other diabetic kidney complication: Secondary | ICD-10-CM | POA: Diagnosis not present

## 2018-01-15 DIAGNOSIS — N186 End stage renal disease: Secondary | ICD-10-CM | POA: Diagnosis not present

## 2018-01-15 DIAGNOSIS — Z992 Dependence on renal dialysis: Secondary | ICD-10-CM | POA: Diagnosis not present

## 2018-01-15 DIAGNOSIS — N2581 Secondary hyperparathyroidism of renal origin: Secondary | ICD-10-CM | POA: Diagnosis not present

## 2018-01-15 DIAGNOSIS — E1129 Type 2 diabetes mellitus with other diabetic kidney complication: Secondary | ICD-10-CM | POA: Diagnosis not present

## 2018-01-15 DIAGNOSIS — D509 Iron deficiency anemia, unspecified: Secondary | ICD-10-CM | POA: Diagnosis not present

## 2018-01-15 DIAGNOSIS — K2211 Ulcer of esophagus with bleeding: Secondary | ICD-10-CM | POA: Diagnosis not present

## 2018-01-18 DIAGNOSIS — N2581 Secondary hyperparathyroidism of renal origin: Secondary | ICD-10-CM | POA: Diagnosis not present

## 2018-01-18 DIAGNOSIS — Z992 Dependence on renal dialysis: Secondary | ICD-10-CM | POA: Diagnosis not present

## 2018-01-18 DIAGNOSIS — N186 End stage renal disease: Secondary | ICD-10-CM | POA: Diagnosis not present

## 2018-01-18 DIAGNOSIS — D631 Anemia in chronic kidney disease: Secondary | ICD-10-CM | POA: Diagnosis not present

## 2018-01-18 DIAGNOSIS — D509 Iron deficiency anemia, unspecified: Secondary | ICD-10-CM | POA: Diagnosis not present

## 2018-01-18 DIAGNOSIS — E1129 Type 2 diabetes mellitus with other diabetic kidney complication: Secondary | ICD-10-CM | POA: Diagnosis not present

## 2018-01-20 DIAGNOSIS — D631 Anemia in chronic kidney disease: Secondary | ICD-10-CM | POA: Diagnosis not present

## 2018-01-20 DIAGNOSIS — D509 Iron deficiency anemia, unspecified: Secondary | ICD-10-CM | POA: Diagnosis not present

## 2018-01-20 DIAGNOSIS — N186 End stage renal disease: Secondary | ICD-10-CM | POA: Diagnosis not present

## 2018-01-20 DIAGNOSIS — Z992 Dependence on renal dialysis: Secondary | ICD-10-CM | POA: Diagnosis not present

## 2018-01-20 DIAGNOSIS — E1129 Type 2 diabetes mellitus with other diabetic kidney complication: Secondary | ICD-10-CM | POA: Diagnosis not present

## 2018-01-20 DIAGNOSIS — N2581 Secondary hyperparathyroidism of renal origin: Secondary | ICD-10-CM | POA: Diagnosis not present

## 2018-01-22 DIAGNOSIS — D509 Iron deficiency anemia, unspecified: Secondary | ICD-10-CM | POA: Diagnosis not present

## 2018-01-22 DIAGNOSIS — N186 End stage renal disease: Secondary | ICD-10-CM | POA: Diagnosis not present

## 2018-01-22 DIAGNOSIS — E1129 Type 2 diabetes mellitus with other diabetic kidney complication: Secondary | ICD-10-CM | POA: Diagnosis not present

## 2018-01-22 DIAGNOSIS — Z992 Dependence on renal dialysis: Secondary | ICD-10-CM | POA: Diagnosis not present

## 2018-01-22 DIAGNOSIS — N2581 Secondary hyperparathyroidism of renal origin: Secondary | ICD-10-CM | POA: Diagnosis not present

## 2018-01-22 DIAGNOSIS — D631 Anemia in chronic kidney disease: Secondary | ICD-10-CM | POA: Diagnosis not present

## 2018-01-25 DIAGNOSIS — N186 End stage renal disease: Secondary | ICD-10-CM | POA: Diagnosis not present

## 2018-01-25 DIAGNOSIS — D509 Iron deficiency anemia, unspecified: Secondary | ICD-10-CM | POA: Diagnosis not present

## 2018-01-25 DIAGNOSIS — E1129 Type 2 diabetes mellitus with other diabetic kidney complication: Secondary | ICD-10-CM | POA: Diagnosis not present

## 2018-01-25 DIAGNOSIS — D631 Anemia in chronic kidney disease: Secondary | ICD-10-CM | POA: Diagnosis not present

## 2018-01-25 DIAGNOSIS — N2581 Secondary hyperparathyroidism of renal origin: Secondary | ICD-10-CM | POA: Diagnosis not present

## 2018-01-25 DIAGNOSIS — Z992 Dependence on renal dialysis: Secondary | ICD-10-CM | POA: Diagnosis not present

## 2018-01-27 DIAGNOSIS — E1129 Type 2 diabetes mellitus with other diabetic kidney complication: Secondary | ICD-10-CM | POA: Diagnosis not present

## 2018-01-27 DIAGNOSIS — D509 Iron deficiency anemia, unspecified: Secondary | ICD-10-CM | POA: Diagnosis not present

## 2018-01-27 DIAGNOSIS — D631 Anemia in chronic kidney disease: Secondary | ICD-10-CM | POA: Diagnosis not present

## 2018-01-27 DIAGNOSIS — N2581 Secondary hyperparathyroidism of renal origin: Secondary | ICD-10-CM | POA: Diagnosis not present

## 2018-01-27 DIAGNOSIS — N186 End stage renal disease: Secondary | ICD-10-CM | POA: Diagnosis not present

## 2018-01-27 DIAGNOSIS — Z992 Dependence on renal dialysis: Secondary | ICD-10-CM | POA: Diagnosis not present

## 2018-01-29 DIAGNOSIS — D509 Iron deficiency anemia, unspecified: Secondary | ICD-10-CM | POA: Diagnosis not present

## 2018-01-29 DIAGNOSIS — N186 End stage renal disease: Secondary | ICD-10-CM | POA: Diagnosis not present

## 2018-01-29 DIAGNOSIS — N2581 Secondary hyperparathyroidism of renal origin: Secondary | ICD-10-CM | POA: Diagnosis not present

## 2018-01-29 DIAGNOSIS — D631 Anemia in chronic kidney disease: Secondary | ICD-10-CM | POA: Diagnosis not present

## 2018-01-29 DIAGNOSIS — Z992 Dependence on renal dialysis: Secondary | ICD-10-CM | POA: Diagnosis not present

## 2018-01-29 DIAGNOSIS — E1129 Type 2 diabetes mellitus with other diabetic kidney complication: Secondary | ICD-10-CM | POA: Diagnosis not present

## 2018-02-01 DIAGNOSIS — N2581 Secondary hyperparathyroidism of renal origin: Secondary | ICD-10-CM | POA: Diagnosis not present

## 2018-02-01 DIAGNOSIS — Z992 Dependence on renal dialysis: Secondary | ICD-10-CM | POA: Diagnosis not present

## 2018-02-01 DIAGNOSIS — N186 End stage renal disease: Secondary | ICD-10-CM | POA: Diagnosis not present

## 2018-02-01 DIAGNOSIS — E1129 Type 2 diabetes mellitus with other diabetic kidney complication: Secondary | ICD-10-CM | POA: Diagnosis not present

## 2018-02-01 DIAGNOSIS — D631 Anemia in chronic kidney disease: Secondary | ICD-10-CM | POA: Diagnosis not present

## 2018-02-01 DIAGNOSIS — D509 Iron deficiency anemia, unspecified: Secondary | ICD-10-CM | POA: Diagnosis not present

## 2018-02-02 ENCOUNTER — Ambulatory Visit (INDEPENDENT_AMBULATORY_CARE_PROVIDER_SITE_OTHER): Payer: Medicare Other | Admitting: Podiatry

## 2018-02-02 ENCOUNTER — Encounter: Payer: Self-pay | Admitting: Podiatry

## 2018-02-02 DIAGNOSIS — N186 End stage renal disease: Secondary | ICD-10-CM | POA: Diagnosis not present

## 2018-02-02 DIAGNOSIS — M79672 Pain in left foot: Secondary | ICD-10-CM | POA: Diagnosis not present

## 2018-02-02 DIAGNOSIS — M79671 Pain in right foot: Secondary | ICD-10-CM | POA: Diagnosis not present

## 2018-02-02 DIAGNOSIS — G5602 Carpal tunnel syndrome, left upper limb: Secondary | ICD-10-CM | POA: Diagnosis not present

## 2018-02-02 DIAGNOSIS — R0901 Asphyxia: Secondary | ICD-10-CM | POA: Diagnosis not present

## 2018-02-02 DIAGNOSIS — Z6832 Body mass index (BMI) 32.0-32.9, adult: Secondary | ICD-10-CM | POA: Diagnosis not present

## 2018-02-02 DIAGNOSIS — K922 Gastrointestinal hemorrhage, unspecified: Secondary | ICD-10-CM | POA: Diagnosis not present

## 2018-02-02 DIAGNOSIS — E1129 Type 2 diabetes mellitus with other diabetic kidney complication: Secondary | ICD-10-CM | POA: Diagnosis not present

## 2018-02-02 DIAGNOSIS — H540X55 Blindness right eye category 5, blindness left eye category 5: Secondary | ICD-10-CM | POA: Diagnosis not present

## 2018-02-02 DIAGNOSIS — B351 Tinea unguium: Secondary | ICD-10-CM

## 2018-02-02 DIAGNOSIS — F3289 Other specified depressive episodes: Secondary | ICD-10-CM | POA: Diagnosis not present

## 2018-02-02 NOTE — Progress Notes (Signed)
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  Patient request toe nail trimmed. 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 Her blood sugar was 231 after a meal, A1c was 6.6 Mother stated that Xiomara can not have surgery at this time according to her PCP.   Disability with cancer in breast. Got on Dialysis since cancer treatment, 2014. Diabetic since 1985 and not controlled.  Objective: Dermatologic: Thick yellow deformed nails x 10. Vascular: Pedal pulses are all palpable. Orthopedic: Severe hallux valgus with bunion deformity bilateral. Contracted digit with digital corn 2nd right. Flat medial arch bilateral. Neurologic: All epicritic and tactile sensations grossly intact.  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 Pain in both feet. Diabetic not controlled.  Treatment: All mycotic nails debrided.  Return in 3 months or as needed.

## 2018-02-02 NOTE — Patient Instructions (Signed)
Seen for hypertrophic nails. All nails debrided. Return in 3 months or as needed.  

## 2018-02-03 DIAGNOSIS — D631 Anemia in chronic kidney disease: Secondary | ICD-10-CM | POA: Diagnosis not present

## 2018-02-03 DIAGNOSIS — Z992 Dependence on renal dialysis: Secondary | ICD-10-CM | POA: Diagnosis not present

## 2018-02-03 DIAGNOSIS — E1129 Type 2 diabetes mellitus with other diabetic kidney complication: Secondary | ICD-10-CM | POA: Diagnosis not present

## 2018-02-03 DIAGNOSIS — N2581 Secondary hyperparathyroidism of renal origin: Secondary | ICD-10-CM | POA: Diagnosis not present

## 2018-02-03 DIAGNOSIS — N186 End stage renal disease: Secondary | ICD-10-CM | POA: Diagnosis not present

## 2018-02-03 DIAGNOSIS — D509 Iron deficiency anemia, unspecified: Secondary | ICD-10-CM | POA: Diagnosis not present

## 2018-02-05 DIAGNOSIS — E1129 Type 2 diabetes mellitus with other diabetic kidney complication: Secondary | ICD-10-CM | POA: Diagnosis not present

## 2018-02-05 DIAGNOSIS — N186 End stage renal disease: Secondary | ICD-10-CM | POA: Diagnosis not present

## 2018-02-05 DIAGNOSIS — Z992 Dependence on renal dialysis: Secondary | ICD-10-CM | POA: Diagnosis not present

## 2018-02-05 DIAGNOSIS — D509 Iron deficiency anemia, unspecified: Secondary | ICD-10-CM | POA: Diagnosis not present

## 2018-02-05 DIAGNOSIS — N2581 Secondary hyperparathyroidism of renal origin: Secondary | ICD-10-CM | POA: Diagnosis not present

## 2018-02-05 DIAGNOSIS — D631 Anemia in chronic kidney disease: Secondary | ICD-10-CM | POA: Diagnosis not present

## 2018-02-06 ENCOUNTER — Emergency Department (HOSPITAL_COMMUNITY): Payer: Medicare Other

## 2018-02-06 ENCOUNTER — Other Ambulatory Visit: Payer: Self-pay

## 2018-02-06 ENCOUNTER — Encounter (HOSPITAL_COMMUNITY): Payer: Self-pay | Admitting: Emergency Medicine

## 2018-02-06 ENCOUNTER — Inpatient Hospital Stay (HOSPITAL_COMMUNITY)
Admission: EM | Admit: 2018-02-06 | Discharge: 2018-02-09 | DRG: 377 | Disposition: A | Discharge status: Home / Self Care | Payer: Medicare Other | Attending: Internal Medicine | Admitting: Internal Medicine

## 2018-02-06 DIAGNOSIS — I132 Hypertensive heart and chronic kidney disease with heart failure and with stage 5 chronic kidney disease, or end stage renal disease: Secondary | ICD-10-CM | POA: Diagnosis present

## 2018-02-06 DIAGNOSIS — K219 Gastro-esophageal reflux disease without esophagitis: Secondary | ICD-10-CM | POA: Diagnosis present

## 2018-02-06 DIAGNOSIS — R51 Headache: Secondary | ICD-10-CM | POA: Diagnosis present

## 2018-02-06 DIAGNOSIS — D62 Acute posthemorrhagic anemia: Secondary | ICD-10-CM | POA: Diagnosis present

## 2018-02-06 DIAGNOSIS — K922 Gastrointestinal hemorrhage, unspecified: Secondary | ICD-10-CM | POA: Diagnosis not present

## 2018-02-06 DIAGNOSIS — H548 Legal blindness, as defined in USA: Secondary | ICD-10-CM | POA: Diagnosis present

## 2018-02-06 DIAGNOSIS — K31811 Angiodysplasia of stomach and duodenum with bleeding: Principal | ICD-10-CM | POA: Diagnosis present

## 2018-02-06 DIAGNOSIS — Z794 Long term (current) use of insulin: Secondary | ICD-10-CM | POA: Diagnosis not present

## 2018-02-06 DIAGNOSIS — D631 Anemia in chronic kidney disease: Secondary | ICD-10-CM | POA: Diagnosis present

## 2018-02-06 DIAGNOSIS — Z91040 Latex allergy status: Secondary | ICD-10-CM | POA: Diagnosis not present

## 2018-02-06 DIAGNOSIS — Z91048 Other nonmedicinal substance allergy status: Secondary | ICD-10-CM | POA: Diagnosis not present

## 2018-02-06 DIAGNOSIS — Z79899 Other long term (current) drug therapy: Secondary | ICD-10-CM | POA: Diagnosis not present

## 2018-02-06 DIAGNOSIS — M1990 Unspecified osteoarthritis, unspecified site: Secondary | ICD-10-CM | POA: Diagnosis present

## 2018-02-06 DIAGNOSIS — E1122 Type 2 diabetes mellitus with diabetic chronic kidney disease: Secondary | ICD-10-CM | POA: Diagnosis present

## 2018-02-06 DIAGNOSIS — I272 Pulmonary hypertension, unspecified: Secondary | ICD-10-CM | POA: Diagnosis present

## 2018-02-06 DIAGNOSIS — Z992 Dependence on renal dialysis: Secondary | ICD-10-CM

## 2018-02-06 DIAGNOSIS — D649 Anemia, unspecified: Secondary | ICD-10-CM | POA: Diagnosis present

## 2018-02-06 DIAGNOSIS — I10 Essential (primary) hypertension: Secondary | ICD-10-CM

## 2018-02-06 DIAGNOSIS — I340 Nonrheumatic mitral (valve) insufficiency: Secondary | ICD-10-CM | POA: Diagnosis present

## 2018-02-06 DIAGNOSIS — E1121 Type 2 diabetes mellitus with diabetic nephropathy: Secondary | ICD-10-CM | POA: Diagnosis present

## 2018-02-06 DIAGNOSIS — I509 Heart failure, unspecified: Secondary | ICD-10-CM | POA: Diagnosis not present

## 2018-02-06 DIAGNOSIS — Z7982 Long term (current) use of aspirin: Secondary | ICD-10-CM | POA: Diagnosis not present

## 2018-02-06 DIAGNOSIS — G709 Myoneural disorder, unspecified: Secondary | ICD-10-CM | POA: Diagnosis present

## 2018-02-06 DIAGNOSIS — I5020 Unspecified systolic (congestive) heart failure: Secondary | ICD-10-CM | POA: Diagnosis present

## 2018-02-06 DIAGNOSIS — Z8711 Personal history of peptic ulcer disease: Secondary | ICD-10-CM

## 2018-02-06 DIAGNOSIS — R531 Weakness: Secondary | ICD-10-CM | POA: Diagnosis not present

## 2018-02-06 DIAGNOSIS — Z885 Allergy status to narcotic agent status: Secondary | ICD-10-CM | POA: Diagnosis not present

## 2018-02-06 DIAGNOSIS — R42 Dizziness and giddiness: Secondary | ICD-10-CM | POA: Diagnosis not present

## 2018-02-06 DIAGNOSIS — N2581 Secondary hyperparathyroidism of renal origin: Secondary | ICD-10-CM | POA: Diagnosis present

## 2018-02-06 DIAGNOSIS — R195 Other fecal abnormalities: Secondary | ICD-10-CM | POA: Diagnosis not present

## 2018-02-06 DIAGNOSIS — I120 Hypertensive chronic kidney disease with stage 5 chronic kidney disease or end stage renal disease: Secondary | ICD-10-CM | POA: Diagnosis not present

## 2018-02-06 DIAGNOSIS — N186 End stage renal disease: Secondary | ICD-10-CM

## 2018-02-06 LAB — DIFFERENTIAL
ABS IMMATURE GRANULOCYTES: 0.1 10*3/uL (ref 0.0–0.1)
BASOS PCT: 0 %
Basophils Absolute: 0 10*3/uL (ref 0.0–0.1)
Eosinophils Absolute: 0.2 10*3/uL (ref 0.0–0.7)
Eosinophils Relative: 2 %
Immature Granulocytes: 1 %
LYMPHS PCT: 22 %
Lymphs Abs: 2.2 10*3/uL (ref 0.7–4.0)
MONOS PCT: 9 %
Monocytes Absolute: 0.9 10*3/uL (ref 0.1–1.0)
NEUTROS PCT: 66 %
Neutro Abs: 6.7 10*3/uL (ref 1.7–7.7)

## 2018-02-06 LAB — BASIC METABOLIC PANEL
ANION GAP: 16 — AB (ref 5–15)
BUN: 69 mg/dL — ABNORMAL HIGH (ref 8–23)
CALCIUM: 8.6 mg/dL — AB (ref 8.9–10.3)
CHLORIDE: 97 mmol/L — AB (ref 98–111)
CO2: 27 mmol/L (ref 22–32)
Creatinine, Ser: 8.35 mg/dL — ABNORMAL HIGH (ref 0.44–1.00)
GFR calc Af Amer: 5 mL/min — ABNORMAL LOW (ref >60)
GFR calc non Af Amer: 4 mL/min — ABNORMAL LOW (ref >60)
GLUCOSE: 234 mg/dL — AB (ref 70–99)
Potassium: 4.4 mmol/L (ref 3.5–5.1)
Sodium: 140 mmol/L (ref 135–145)

## 2018-02-06 LAB — CBC
HCT: 17.7 % — ABNORMAL LOW (ref 36.0–46.0)
HEMOGLOBIN: 5.5 g/dL — AB (ref 12.0–15.0)
MCH: 31.1 pg (ref 26.0–34.0)
MCHC: 31.1 g/dL (ref 30.0–36.0)
MCV: 100 fL (ref 78.0–100.0)
Platelets: 219 10*3/uL (ref 150–400)
RBC: 1.77 MIL/uL — ABNORMAL LOW (ref 3.87–5.11)
RDW: 16.7 % — ABNORMAL HIGH (ref 11.5–15.5)
WBC: 8.8 10*3/uL (ref 4.0–10.5)

## 2018-02-06 LAB — URINALYSIS, ROUTINE W REFLEX MICROSCOPIC
BACTERIA UA: NONE SEEN
BILIRUBIN URINE: NEGATIVE
Glucose, UA: 50 mg/dL — AB
Hgb urine dipstick: NEGATIVE
KETONES UR: NEGATIVE mg/dL
Leukocytes, UA: NEGATIVE
NITRITE: NEGATIVE
PROTEIN: 100 mg/dL — AB
Specific Gravity, Urine: 1.013 (ref 1.005–1.030)
pH: 8 (ref 5.0–8.0)

## 2018-02-06 LAB — PROTIME-INR
INR: 1.13
Prothrombin Time: 14.4 seconds (ref 11.4–15.2)

## 2018-02-06 LAB — GLUCOSE, CAPILLARY
Glucose-Capillary: 154 mg/dL — ABNORMAL HIGH (ref 70–99)
Glucose-Capillary: 155 mg/dL — ABNORMAL HIGH (ref 70–99)

## 2018-02-06 LAB — POC OCCULT BLOOD, ED: Fecal Occult Bld: POSITIVE — AB

## 2018-02-06 LAB — I-STAT TROPONIN, ED: Troponin i, poc: 0.07 ng/mL (ref 0.00–0.08)

## 2018-02-06 LAB — PREPARE RBC (CROSSMATCH)

## 2018-02-06 MED ORDER — ALBUTEROL SULFATE (2.5 MG/3ML) 0.083% IN NEBU: 2.5000 mg | INHALATION_SOLUTION | Freq: Four times a day (QID) | RESPIRATORY_TRACT | Status: DC | PRN

## 2018-02-06 MED ORDER — FLUTICASONE PROPIONATE 50 MCG/ACT NA SUSP
1.0000 | Freq: Every day | NASAL | Status: DC | PRN
Start: 2018-02-06 — End: 2018-02-09
  Filled 2018-02-06: qty 16

## 2018-02-06 MED ORDER — HYDROCODONE-ACETAMINOPHEN 5-325 MG PO TABS: 1.0000 | ORAL_TABLET | ORAL | Status: DC | PRN

## 2018-02-06 MED ORDER — CLONIDINE HCL 0.2 MG PO TABS: 0.2000 mg | ORAL_TABLET | Freq: Two times a day (BID) | ORAL | Status: DC

## 2018-02-06 MED ORDER — DOCUSATE SODIUM 100 MG PO CAPS
100.0000 mg | ORAL_CAPSULE | Freq: Two times a day (BID) | ORAL | Status: DC
  Administered 2018-02-07 – 2018-02-09 (×5): 100 mg via ORAL
  Filled 2018-02-06 (×6): qty 1

## 2018-02-06 MED ORDER — LANTHANUM CARBONATE 500 MG PO CHEW
2000.0000 mg | CHEWABLE_TABLET | Freq: Three times a day (TID) | ORAL | Status: DC
  Administered 2018-02-07 – 2018-02-09 (×7): 2000 mg via ORAL
  Filled 2018-02-06 (×7): qty 4

## 2018-02-06 MED ORDER — DIPHENHYDRAMINE HCL 25 MG PO TABS
12.5000 mg | ORAL_TABLET | ORAL | Status: DC
  Filled 2018-02-06: qty 0.5

## 2018-02-06 MED ORDER — CARVEDILOL 12.5 MG PO TABS
12.5000 mg | ORAL_TABLET | Freq: Two times a day (BID) | ORAL | Status: DC
  Administered 2018-02-07 – 2018-02-09 (×4): 12.5 mg via ORAL
  Filled 2018-02-06 (×5): qty 1

## 2018-02-06 MED ORDER — ONDANSETRON HCL 4 MG PO TABS: 4.0000 mg | ORAL_TABLET | Freq: Four times a day (QID) | ORAL | Status: DC | PRN

## 2018-02-06 MED ORDER — GABAPENTIN 100 MG PO CAPS
100.0000 mg | ORAL_CAPSULE | Freq: Two times a day (BID) | ORAL | Status: DC
  Administered 2018-02-06 – 2018-02-09 (×6): 100 mg via ORAL
  Filled 2018-02-06 (×6): qty 1

## 2018-02-06 MED ORDER — ACETAMINOPHEN 500 MG PO TABS
500.0000 mg | ORAL_TABLET | Freq: Two times a day (BID) | ORAL | Status: DC
  Administered 2018-02-06 – 2018-02-09 (×5): 500 mg via ORAL
  Filled 2018-02-06 (×5): qty 1

## 2018-02-06 MED ORDER — BRIMONIDINE TARTRATE 0.2 % OP SOLN
1.0000 [drp] | Freq: Two times a day (BID) | OPHTHALMIC | Status: DC
  Filled 2018-02-06: qty 5

## 2018-02-06 MED ORDER — BRIMONIDINE TARTRATE-TIMOLOL 0.2-0.5 % OP SOLN: 1.0000 [drp] | Freq: Two times a day (BID) | OPHTHALMIC | Status: DC

## 2018-02-06 MED ORDER — ATROPINE SULFATE 1 % OP SOLN: 1.0000 [drp] | Freq: Two times a day (BID) | OPHTHALMIC | Status: DC

## 2018-02-06 MED ORDER — INSULIN ASPART 100 UNIT/ML ~~LOC~~ SOLN
0.0000 [IU] | Freq: Three times a day (TID) | SUBCUTANEOUS | Status: DC
  Administered 2018-02-07 (×2): 2 [IU] via SUBCUTANEOUS
  Administered 2018-02-07 – 2018-02-08 (×2): 1 [IU] via SUBCUTANEOUS
  Administered 2018-02-08: 2 [IU] via SUBCUTANEOUS
  Administered 2018-02-09: 3 [IU] via SUBCUTANEOUS

## 2018-02-06 MED ORDER — PANTOPRAZOLE SODIUM 40 MG IV SOLR
40.0000 mg | Freq: Two times a day (BID) | INTRAVENOUS | Status: DC
  Filled 2018-02-06 (×2): qty 40

## 2018-02-06 MED ORDER — PREDNISOLONE ACETATE 1 % OP SUSP
1.0000 [drp] | Freq: Four times a day (QID) | OPHTHALMIC | Status: DC
  Administered 2018-02-06 – 2018-02-09 (×10): 1 [drp] via OPHTHALMIC
  Filled 2018-02-06: qty 5

## 2018-02-06 MED ORDER — LISINOPRIL 40 MG PO TABS
40.0000 mg | ORAL_TABLET | Freq: Every day | ORAL | Status: DC
  Administered 2018-02-06 – 2018-02-08 (×3): 40 mg via ORAL
  Filled 2018-02-06 (×3): qty 1

## 2018-02-06 MED ORDER — AMLODIPINE BESYLATE 10 MG PO TABS
10.0000 mg | ORAL_TABLET | Freq: Every day | ORAL | Status: DC
  Administered 2018-02-06 – 2018-02-08 (×3): 10 mg via ORAL
  Filled 2018-02-06 (×3): qty 1

## 2018-02-06 MED ORDER — SODIUM CHLORIDE 0.9 % IV SOLN
10.0000 mL/h | Freq: Once | INTRAVENOUS | Status: AC
  Administered 2018-02-07: 10:00:00 via INTRAVENOUS

## 2018-02-06 MED ORDER — PANTOPRAZOLE SODIUM 40 MG PO TBEC: 40.0000 mg | DELAYED_RELEASE_TABLET | Freq: Every day | ORAL | Status: DC

## 2018-02-06 MED ORDER — RENA-VITE PO TABS
1.0000 | ORAL_TABLET | Freq: Every day | ORAL | Status: DC
  Filled 2018-02-06: qty 1

## 2018-02-06 MED ORDER — TIMOLOL MALEATE 0.5 % OP SOLN
1.0000 [drp] | Freq: Two times a day (BID) | OPHTHALMIC | Status: DC
Start: 2018-02-06 — End: 2018-02-09
  Administered 2018-02-06 – 2018-02-09 (×6): 1 [drp] via OPHTHALMIC
  Filled 2018-02-06: qty 5

## 2018-02-06 MED ORDER — ONDANSETRON HCL 4 MG/2ML IJ SOLN: 4.0000 mg | Freq: Four times a day (QID) | INTRAMUSCULAR | Status: DC | PRN

## 2018-02-06 NOTE — ED Provider Notes (Signed)
[facility] EMERGENCY DEPARTMENT Provider Note   CSN: [id number] Arrival date & time: 02/06/18  1304     History   Chief Complaint Chief Complaint  Patient presents with  . Weakness    HPI [name] is a 67 y.o. female.  HPI Patient goes to Monday Wednesday Friday dialysis.  She reports yesterday after dialysis she was extremely fatigued.  She reports she is frequently very washed out after dialysis.  Yesterday however did seem worse.  Today she reports she feels very lightheaded.  She reports she has been having general aching under her ribs on both sides and into her shoulders.  This has been going on for several days.  Patient is blind and cannot see what her bowel movement looks like.  She does not know if there is been any blood.  She has not had any vomiting.  Her daughter who is at bedside reports that she has had GI bleeding in the past.  She reports that they were told that it was in her esophagus. Past Medical History:  Diagnosis Date  . Anemia   . Anxiety   . Arthritis    "joints" (06/15/2013)  . Blood transfusion without reported diagnosis   . Breast cancer ([place])    left  . CHF (congestive heart failure) ([place])   . ESRD (end stage renal disease) ([place])    "suppose to start dialysis today" (06/15/2013)  . GERD (gastroesophageal reflux disease)   . Hypertension   . Myalgia 12/31/2011  . Neuropathy 12/31/2011  . Shortness of breath    "when she doesn't go to dialysis"  . Type II diabetes mellitus [facility])     Patient Active Problem List   Diagnosis Date Noted  . Status post carpal tunnel release 12/10/2017  . Carpal tunnel syndrome, left upper limb 11/10/2017  . Carpal tunnel syndrome, right upper limb 11/10/2017  . Bilateral hand numbness 09/28/2017  . Esophageal ulcer with bleeding   . Acute blood loss anemia   . Non-intractable cyclical vomiting with nausea   . Generalized abdominal pain   . Symptomatic anemia 07/06/2017  . Positive  occult stool blood test 07/06/2017  . ESRD (end stage renal disease) ([place]) 07/06/2017  . Type II diabetes mellitus ([place]) 07/06/2017  . Hypertension 07/06/2017  . Chronic combined systolic and diastolic CHF (congestive heart failure) ([place]) 07/06/2017  . Complicated migraine 40/98/1191  . ESRD on dialysis ([place]) 06/04/2017  . Fluid overload 06/04/2017  . Blindness of both eyes 06/04/2017  . Glaucoma 06/04/2017  . Pulmonary edema 06/04/2017  . Acute on chronic respiratory failure with hypoxia ([place]) 10/27/2016  . Essential hypertension 10/27/2016  . GERD (gastroesophageal reflux disease) 10/27/2016  . Poor venous access 02/08/2015  . End stage renal disease ([place]) 07/08/2013  . Anemia 06/15/2013  . Acute on chronic combined systolic and diastolic CHF (congestive heart failure) ([place]) 06/15/2013  . Type II diabetes mellitus with renal manifestations ([place]) 06/15/2013  . Chronic kidney disease (CKD), stage IV (severe) ([place]) 06/03/2013  . Myalgia 12/31/2011  . Neuropathy 12/31/2011  . Breast cancer of upper-inner quadrant of left female breast ([place]) 06/27/2011    Past Surgical History:  Procedure Laterality Date  . ABDOMINAL HYSTERECTOMY     partial  . AV FISTULA PLACEMENT Right 06/06/2013   Procedure: ARTERIOVENOUS (AV) FISTULA CREATION-RIGHT BRACHIAL CEPHALIC;  Surgeon: Conrad Astor, MD;  Location: [place];  Service: Vascular;  Laterality: Right;  . BREAST BIOPSY Left   . BREAST LUMPECTOMY  Left    "and took out some lymph nodes" (06/15/2013)  . CARPAL TUNNEL RELEASE Left 11/26/2017   Procedure: LEFT CARPAL TUNNEL RELEASE;  Surgeon: [name], MD;  Location: [place];  Service: Orthopedics;  Laterality: Left;  . CATARACT EXTRACTION W/ ANTERIOR VITRECTOMY Bilateral   . CESAREAN SECTION  1980  . ESOPHAGOGASTRODUODENOSCOPY N/A 07/08/2017   Procedure: ESOPHAGOGASTRODUODENOSCOPY (EGD);  Surgeon: [name], MD;  [facility] ENDOSCOPY;  Service: Endoscopy;  Laterality: N/A;  .  EYE SURGERY Bilateral    laser surgery  . PARS PLANA VITRECTOMY Right 05/05/2017   Procedure: PARS PLANA VITRECTOMY WITH 25 GAUGE; PARTIAL REMOVAL OF OIL; INFERIOR PERIPHERAL IRIDECTOMY, REFORM ANTERIOR CHAMBER RIGHT EYE;  Surgeon: [name], MD;  Location: [place];  Service: Ophthalmology;  Laterality: Right;  . REFRACTIVE SURGERY Bilateral   . REMOVAL OF A DIALYSIS CATHETER Right 06/06/2013   Procedure: REMOVAL OF RIGHT MEDIPORT;  Surgeon: Conrad [place], MD;  Location: [place];  Service: Vascular;  Laterality: Right;  . TONSILLECTOMY       OB History   None      Home Medications    Prior to Admission medications   Medication Sig Start Date End Date Taking? Authorizing Provider  acetaminophen (TYLENOL) 500 MG tablet Take 500 mg by mouth 2 (two) times daily.    [provider]  albuterol (PROVENTIL) (2.5 MG/3ML) 0.083% nebulizer solution Take 3 mLs (2.5 mg total) by nebulization every 6 (six) hours as needed for wheezing or shortness of breath. 10/30/16   Theodis Blaze, MD  amLODipine (NORVASC) 10 MG tablet Take 10 mg at bedtime by mouth. 06/02/17   [provider]  aspirin 81 MG chewable tablet Chew 81 mg by mouth daily with lunch.     [provider]  atropine 1 % ophthalmic solution Place 1 drop into the right eye 2 (two) times daily. 07/04/17   [provider]  B Complex-C-Folic Acid (RENA-VITE RX) 1 MG TABS Take 1 tablet by mouth daily with lunch.  09/29/16   [provider]  benzonatate (TESSALON PERLES) 100 MG capsule Take 1 capsule (100 mg total) by mouth 3 (three) times daily as needed for cough. Patient not taking: Reported on 11/24/2017 08/28/16   Mackuen, Courteney Lyn, MD  carvedilol (COREG) 12.5 MG tablet Take 12.5 mg by mouth 2 (two) times daily with a meal.    [provider]  cloNIDine (CATAPRES) 0.2 MG tablet Take 0.2 mg by mouth 2 (two) times daily. with lunch and at bedtime    [provider]  COMBIGAN 0.2-0.5  % ophthalmic solution  10/23/17   [provider]  darbepoetin (ARANESP) 200 MCG/0.4ML SOLN injection Inject 0.4 mLs (200 mcg total) into the vein every Wednesday with hemodialysis. 06/22/13   Geradine Girt, DO  diphenhydrAMINE (BENADRYL) 25 MG tablet Take 12.5 mg by mouth every Monday, Wednesday, and Friday.    [provider]  docusate sodium (COLACE) 100 MG capsule Take 100 mg by mouth 2 (two) times daily.     [provider]  doxercalciferol (HECTOROL) 4 MCG/2ML injection Inject 0.5 mLs (1 mcg total) into the vein every Monday, Wednesday, and Friday with hemodialysis. 06/18/13   Geradine Girt, DO  fluticasone (FLONASE) 50 MCG/ACT nasal spray Place 1-2 sprays into both nostrils daily as needed for allergies or rhinitis.     [provider]  gabapentin (NEURONTIN) 100 MG capsule Take 100 mg by mouth 2 (two) times daily.  [provider]  HYDROcodone-acetaminophen (NORCO/VICODIN) 5-325 MG tablet Take 1-2 tablets by mouth every 4 (four) hours as needed for moderate pain. 11/26/17   [name], MD  insulin aspart (NOVOLOG FLEXPEN) 100 UNIT/ML FlexPen Inject 10-16 Units into the skin See admin instructions. Sliding scale     [provider]  lanthanum (FOSRENOL) 1000 MG chewable tablet Chew 2,000 mg by mouth 3 (three) times daily with meals. Take 1 tablet with snack    [provider]  lisinopril (PRINIVIL,ZESTRIL) 40 MG tablet Take 40 mg by mouth at bedtime.     [provider]  omeprazole (PRILOSEC) 40 MG capsule Take 40 mg by mouth daily.    [provider]  [facility] VERIO test strip 3 (three) times daily. for testing 12/15/17   [provider]  prednisoLONE acetate (PRED FORTE) 1 % ophthalmic suspension Place 1 drop into the right eye 4 (four) times daily. 04/27/17   [provider]    Family History Family History  Problem Relation Age of Onset  . Diabetes Mother   . Hyperlipidemia  Mother   . Hypertension Mother   . Hypertension Father   . Diabetes Sister   . Diabetes Brother   . Hypertension Brother   . Heart attack Brother   . Kidney disease Brother   . Colon cancer Neg Hx   . Colon polyps Neg Hx   . Esophageal cancer Neg Hx   . Gallbladder disease Neg Hx     Social History Social History   Tobacco Use  . Smoking status: Never Smoker  . Smokeless tobacco: Never Used  Substance Use Topics  . Alcohol use: No  . Drug use: No     Allergies   Latex; Oxycodone; Tramadol; Tape; and Vicodin [hydrocodone-acetaminophen]   Review of Systems Review of Systems 10 Systems reviewed and are negative for acute change except as noted in the HPI.   Physical Exam Updated Vital Signs BP (!) 135/55   Pulse 81   Temp 98.1 F (36.7 C) (Oral)   Resp 16   SpO2 95%   Physical Exam  Constitutional: She is oriented to person, place, and time. She appears well-developed and well-nourished. No distress.  HENT:  Mouth/Throat: Oropharynx is clear and moist.  Mucous membranes are very pale in appearance.  Neck: Neck supple.  Cardiovascular: Normal rate.  2\6 systolic ejection murmur.  Heart is regular.  Pulmonary/Chest: Effort normal and breath sounds normal.  Abdominal: Soft. She exhibits no distension. There is no tenderness. There is no guarding.  Genitourinary:  Genitourinary Comments: Tool brown.  No melena or visible blood  Musculoskeletal: Normal range of motion. She exhibits no edema or tenderness.  Legs in good physical condition with no peripheral edema and very good skin condition.  Neurological: She is alert and oriented to person, place, and time. She exhibits normal muscle tone. Coordination normal.  Skin: Skin is warm and dry. There is pallor.  Psychiatric: She has a normal mood and affect.     ED Treatments / Results  Labs (all labs ordered are listed, but only abnormal results are displayed) Labs Reviewed  BASIC METABOLIC PANEL - Abnormal;  Notable for the following components:      Result Value   Chloride 97 (*)    Glucose, Bld 234 (*)    BUN 69 (*)    Creatinine, Ser 8.35 (*)    Calcium 8.6 (*)    GFR calc non Af Amer 4 (*)  GFR calc Af Amer 5 (*)    Anion gap 16 (*)    All other components within normal limits  CBC - Abnormal; Notable for the following components:   RBC 1.77 (*)    Hemoglobin 5.5 (*)    HCT 17.7 (*)    RDW 16.7 (*)    All other components within normal limits  URINALYSIS, ROUTINE W REFLEX MICROSCOPIC  PROTIME-INR  DIFFERENTIAL  CBG MONITORING, ED  I-STAT TROPONIN, ED  POC OCCULT BLOOD, ED  PREPARE RBC (CROSSMATCH)  TYPE AND SCREEN    EKG EKG Interpretation  Date/Time:  Saturday February 06 2018 13:28:08 EDT Ventricular Rate:  84 PR Interval:  146 QRS Duration: 100 QT Interval:  404 QTC Calculation: 477 R Axis:   12 Text Interpretation:  Normal sinus rhythm Left ventricular hypertrophy with repolarization abnormality Abnormal ECG agree, no change Confirmed by Charlesetta Shanks [phone]) on 02/06/2018 2:39:58 PM   Radiology Dg Chest Port 1 View  Result Date: 02/06/2018 CLINICAL DATA:  Weakness and dizziness.  Renal failure. EXAM: PORTABLE CHEST 1 VIEW COMPARISON:  July 06, 2017 FINDINGS: There is cardiomegaly. There is slight pulmonary venous hypertension. There is no edema or consolidation. There is aortic atherosclerosis. No evident adenopathy. No bone lesions. IMPRESSION: Cardiomegaly with a degree of pulmonary vascular congestion. No edema or consolidation. There is aortic atherosclerosis. Aortic Atherosclerosis (ICD10-I70.0). Electronically Signed   By: Lowella Grip III M.D.   On: 02/06/2018 15:11    Procedures Procedures (including critical care time) CRITICAL CARE Performed by: Charlesetta Shanks   Total critical care time: 30 minutes  Critical care time was exclusive of separately billable procedures and treating other patients.  Critical care was necessary to treat or  prevent imminent or life-threatening deterioration.  Critical care was time spent personally by me on the following activities: development of treatment plan with patient and/or surrogate as well as nursing, discussions with consultants, evaluation of patient's response to treatment, examination of patient, obtaining history from patient or surrogate, ordering and performing treatments and interventions, ordering and review of laboratory studies, ordering and review of radiographic studies, pulse oximetry and re-evaluation of patient's condition. Medications Ordered in ED Medications  0.9 %  sodium chloride infusion (has no administration in time range)     Initial Impression / Assessment and Plan / ED Course  I have reviewed the triage vital signs and the nursing notes.  Pertinent labs & imaging results that were available during my care of the patient were reviewed by me and considered in my medical decision making (see chart for details).    Consult: Tried hospitalist for admission  Final Clinical Impressions(s) / ED Diagnoses   Final diagnoses:  Symptomatic anemia  ESRD (end stage renal disease) on dialysis [facility])   Patient has had increasing weakness for several days.  Today this was much worse.  Patient has multiple medical comorbidities including ESRD with Monday Wednesday Friday dialysis.  Reports extreme fatigue and lightheadedness yesterday.  She is found to be anemic to 5.5.  This does not appear to be any brisk GI bleed.  Patient has not had vomiting.  Stool is brown normal visual appearance.  Plan will be to initiate blood transfusion and admit. ED Discharge Orders    None       Charlesetta Shanks, MD 02/06/18 1730

## 2018-02-06 NOTE — Progress Notes (Signed)
New Admission Note: Patient admitted from the [facility] to room 5M04  Arrival Method: via stretcher Mental Orientation: alert and oriented Telemetry: Placed per order Assessment: Completed Skin: Intact IV: L FA Pain: denies Tubes: None Safety Measures: Safety Fall Prevention Plan has been discussed  Admission: To be completed 5 [place] Orientation: Patient has been orientated to the room, unit and staff.   Family: Sister at bedside  Orders to be reviewed and implemented. Will continue to monitor the patient. Call light has been placed within reach and bed alarm has been activated.   Mady Gemma, BSN, RN-BC Phone: [phone]

## 2018-02-06 NOTE — ED Notes (Signed)
Pt is dialysis pt and does not produce urine often. UA attempted. RN & MD informed

## 2018-02-06 NOTE — ED Notes (Signed)
Pt reports she does not have to pee at this time. Pt is dialysis patient and does not urinate much.

## 2018-02-06 NOTE — ED Triage Notes (Addendum)
Patient complains of right shoulder soreness, dizziness, and generalized weakness after dialysis yesterday. Patient completed three of her four hour dialysis yesterday. Reports similar episodes on the past, patient unsure of the cause.

## 2018-02-06 NOTE — ED Notes (Signed)
Lab called Hgb 5.5.  MD notified.

## 2018-02-06 NOTE — H&P (Addendum)
History and Physical    [name] ATF:[id number] DOB: Aug 12, 1950 DOA: 02/06/2018  PCP: Prince Solian, MD   Patient coming from: Home   Chief Complaint: generalized weakness.   HPI: [name] is a 67 y.o. female with medical history significant of end-stage renal disease on hemodialysis (M-W-F), systolic heart failure, mitral regurgitation, pulmonary hypertension, type 2 diabetes mellitus, hypertension and legal blindness. Reported 2 week history of generalized weakness, which has been worsening, more severe with exertion, no improving factors, associated with decreased balance, easy fatigability and lethargy. Yesterday after completing hemodialysis she felt significantly weak, had a near syncope episode while walking. This morning her symptoms were more severe in intensity she was brought into the hospital privilege.  She had a hospitalization in December 2018 with similar symptoms, she was diagnosed with upper gastrointestinal bleed due to esophageal ulcers, received 7 units of packed red blood cells. She has been receiving iron and erythropoietin as an outpatient in the hemodialysis center. Her last hemoglobin check at the hemodialysis center was 10.3 (unclear timing). Denies any hematemesis, hematochezia or melena. No hematuria or bleeding from her dialysis access.   ED Course: Patient was noted to be severely anemic with a hemoglobin of 5.5 she was referred for admission for further treatment.  Review of Systems:  1. General: No fevers, no chills, no weight gain or weight loss, generalized weakness and easy fatigability as mentioned in history present illness. 2. ENT: No runny nose or sore throat, no hearing disturbances 3. Pulmonary: Exertional dyspnea, cough, wheezing, or hemoptysis 4. Cardiovascular: No angina, claudication, lower extremity edema, pnd or orthopnea 5. Gastrointestinal: No nausea or vomiting, no diarrhea or constipation 6. Hematology: No easy bruisability  or frequent infections/ melena, hematochezia or hematemesis 7. Urology: Patient does not make urine  8. Dermatology: No rashes. 9. Neurology: No seizures or paresthesias 10. Musculoskeletal: No joint pain or deformities  Past Medical History:  Diagnosis Date  . Anemia   . Anxiety   . Arthritis    "joints" (06/15/2013)  . Blood transfusion without reported diagnosis   . Breast cancer ([place])    left  . CHF (congestive heart failure) ([place])   . ESRD (end stage renal disease) ([place])    "suppose to start dialysis today" (06/15/2013)  . GERD (gastroesophageal reflux disease)   . Hypertension   . Myalgia 12/31/2011  . Neuropathy 12/31/2011  . Shortness of breath    "when she doesn't go to dialysis"  . Type II diabetes mellitus ([place])     Past Surgical History:  Procedure Laterality Date  . ABDOMINAL HYSTERECTOMY     partial  . AV FISTULA PLACEMENT Right 06/06/2013   Procedure: ARTERIOVENOUS (AV) FISTULA CREATION-RIGHT BRACHIAL CEPHALIC;  Surgeon: Conrad Laurel, MD;  Location: [place];  Service: Vascular;  Laterality: Right;  . BREAST BIOPSY Left   . BREAST LUMPECTOMY Left    "and took out some lymph nodes" (06/15/2013)  . CARPAL TUNNEL RELEASE Left 11/26/2017   Procedure: LEFT CARPAL TUNNEL RELEASE;  Surgeon: [name], MD;  Location: [place];  Service: Orthopedics;  Laterality: Left;  . CATARACT EXTRACTION W/ ANTERIOR VITRECTOMY Bilateral   . CESAREAN SECTION  1980  . ESOPHAGOGASTRODUODENOSCOPY N/A 07/08/2017   Procedure: ESOPHAGOGASTRODUODENOSCOPY (EGD);  Surgeon: [name], MD;  Location[facility] ENDOSCOPY;  Service: Endoscopy;  Laterality: N/A;  . EYE SURGERY Bilateral    laser surgery  . PARS PLANA VITRECTOMY Right 05/05/2017   Procedure: PARS PLANA VITRECTOMY WITH 25 GAUGE;  PARTIAL REMOVAL OF OIL; INFERIOR PERIPHERAL IRIDECTOMY, REFORM ANTERIOR CHAMBER RIGHT EYE;  Surgeon: [name], MD;  Location: [place];  Service: Ophthalmology;  Laterality: Right;  . REFRACTIVE  SURGERY Bilateral   . REMOVAL OF A DIALYSIS CATHETER Right 06/06/2013   Procedure: REMOVAL OF RIGHT MEDIPORT;  Surgeon: Conrad Chireno, MD;  Location: [place];  Service: Vascular;  Laterality: Right;  . TONSILLECTOMY       reports that she has never smoked. She has never used smokeless tobacco. She reports that she does not drink alcohol or use drugs.  Allergies  Allergen Reactions  . Latex Hives  . Oxycodone Other (See Comments)    Hallucinations   . Tramadol Other (See Comments)    hallucinations   . Tape Rash    67M Transpore adhesive tape. Medical tape  . Vicodin [Hydrocodone-Acetaminophen] Other (See Comments)    hallucinations    Family History  Problem Relation Age of Onset  . Diabetes Mother   . Hyperlipidemia Mother   . Hypertension Mother   . Hypertension Father   . Diabetes Sister   . Diabetes Brother   . Hypertension Brother   . Heart attack Brother   . Kidney disease Brother   . Colon cancer Neg Hx   . Colon polyps Neg Hx   . Esophageal cancer Neg Hx   . Gallbladder disease Neg Hx      Prior to Admission medications   Medication Sig Start Date End Date Taking? Authorizing Provider  acetaminophen (TYLENOL) 500 MG tablet Take 500 mg by mouth 2 (two) times daily.    [provider]  albuterol (PROVENTIL) (2.5 MG/67ML) 0.083% nebulizer solution Take 3 mLs (2.5 mg total) by nebulization every 6 (six) hours as needed for wheezing or shortness of breath. 10/30/16   Theodis Blaze, MD  amLODipine (NORVASC) 10 MG tablet Take 10 mg at bedtime by mouth. 06/02/17   [provider]  aspirin 81 MG chewable tablet Chew 81 mg by mouth daily with lunch.     [provider]  atropine 1 % ophthalmic solution Place 1 drop into the right eye 2 (two) times daily. 07/04/17   [provider]  B Complex-C-Folic Acid (RENA-VITE RX) 1 MG TABS Take 1 tablet by mouth daily with lunch.  09/29/16   [provider]  benzonatate (TESSALON PERLES) 100 MG  capsule Take 1 capsule (100 mg total) by mouth 3 (three) times daily as needed for cough. Patient not taking: Reported on 11/24/2017 08/28/16   Mackuen, Courteney Lyn, MD  carvedilol (COREG) 12.5 MG tablet Take 12.5 mg by mouth 2 (two) times daily with a meal.    [provider]  cloNIDine (CATAPRES) 0.2 MG tablet Take 0.2 mg by mouth 2 (two) times daily. with lunch and at bedtime    [provider]  COMBIGAN 0.2-0.5 % ophthalmic solution  10/23/17   [provider]  darbepoetin (ARANESP) 200 MCG/0.4ML SOLN injection Inject 0.4 mLs (200 mcg total) into the vein every Wednesday with hemodialysis. 06/22/13   Geradine Girt, DO  diphenhydrAMINE (BENADRYL) 25 MG tablet Take 12.5 mg by mouth every Monday, Wednesday, and Friday.    [provider]  docusate sodium (COLACE) 100 MG capsule Take 100 mg by mouth 2 (two) times daily.     [provider]  doxercalciferol (HECTOROL) 4 MCG/2ML injection Inject 0.5 mLs (1 mcg total) into the vein every Monday, Wednesday, and Friday with hemodialysis. 06/18/13   Eliseo Squires,  Jessica U, DO  fluticasone (FLONASE) 50 MCG/ACT nasal spray Place 1-2 sprays into both nostrils daily as needed for allergies or rhinitis.     [provider]  gabapentin (NEURONTIN) 100 MG capsule Take 100 mg by mouth 2 (two) times daily.    [provider]  HYDROcodone-acetaminophen (NORCO/VICODIN) 5-325 MG tablet Take 1-2 tablets by mouth every 4 (four) hours as needed for moderate pain. 11/26/17   [name], MD  insulin aspart (NOVOLOG FLEXPEN) 100 UNIT/ML FlexPen Inject 10-16 Units into the skin See admin instructions. Sliding scale     [provider]  lanthanum (FOSRENOL) 1000 MG chewable tablet Chew 2,000 mg by mouth 3 (three) times daily with meals. Take 1 tablet with snack    [provider]  lisinopril (PRINIVIL,ZESTRIL) 40 MG tablet Take 40 mg by mouth at bedtime.     [provider]  omeprazole  (PRILOSEC) 40 MG capsule Take 40 mg by mouth daily.    [provider]  [facility] VERIO test strip 3 (three) times daily. for testing 12/15/17   [provider]  prednisoLONE acetate (PRED FORTE) 1 % ophthalmic suspension Place 1 drop into the right eye 4 (four) times daily. 04/27/17   [provider]    Physical Exam: Vitals:   02/06/18 1545 02/06/18 1600 02/06/18 1630 02/06/18 1645  BP:  (!) 130/49 (!) 135/55   Pulse: 80 85 81 81  Resp: 13 16 16 20   Temp:      TempSrc:      SpO2: 93% 100% 95% 96%    Constitutional: deconditioned and ill looking appearing Vitals:   02/06/18 1545 02/06/18 1600 02/06/18 1630 02/06/18 1645  BP:  (!) 130/49 (!) 135/55   Pulse: 80 85 81 81  Resp: 13 16 16 20   Temp:      TempSrc:      SpO2: 93% 100% 95% 96%   Eyes:  Positive conjunctivae pallor Head normocephalic, nose and ears with no deformities ENMT: dry membranes are dry. Posterior pharynx clear of any exudate or lesions.Normal dentition.  Neck: normal, supple, no masses, no thyromegaly Respiratory: clear to auscultation bilaterally, no wheezing, no crackles. Normal respiratory effort. No accessory muscle use.  Cardiovascular: Regular rate and rhythm, no ubs or gallops. Positive systolic murmur at the apex, radiated to the axilla, 3/6 in intensity. No extremity edema. 2+ pedal pulses. No carotid bruits.  Abdomen: no tenderness, no masses palpated. No hepatosplenomegaly. Bowel sounds positive. Protuberant.  Musculoskeletal: no clubbing / cyanosis. No joint deformity upper and lower extremities. Good ROM, no contractures. Normal muscle tone.  Skin: no rashes, lesions, ulcers. No induration Neurologic: CN 2-12 grossly intact. Sensation intact, DTR normal. Strength 5/5 in all 4.   Hemodialysis access right upper extremity fistula with palpable bruit.   Labs on Admission: I have personally reviewed following labs and imaging studies  CBC: Recent Labs  Lab 02/06/18 1334    WBC 8.8  NEUTROABS 6.7  HGB 5.5*  HCT 17.7*  MCV 100.0  PLT 672   Basic Metabolic Panel: Recent Labs  Lab 02/06/18 1334  NA 140  K 4.4  CL 97*  CO2 27  GLUCOSE 234*  BUN 69*  CREATININE 8.35*  CALCIUM 8.6*   GFR: CrCl cannot be calculated (Unknown ideal weight.). Liver Function Tests: No results for input(s): AST, ALT, ALKPHOS, BILITOT, PROT, ALBUMIN in the last 168 hours. No results for input(s): LIPASE, AMYLASE in the last 168 hours. No results for input(s): AMMONIA in  the last 168 hours. Coagulation Profile: Recent Labs  Lab 02/06/18 1612  INR 1.13   Cardiac Enzymes: No results for input(s): CKTOTAL, CKMB, CKMBINDEX, TROPONINI in the last 168 hours. BNP (last 3 results) No results for input(s): PROBNP in the last 8760 hours. HbA1C: No results for input(s): HGBA1C in the last 72 hours. CBG: No results for input(s): GLUCAP in the last 168 hours. Lipid Profile: No results for input(s): CHOL, HDL, LDLCALC, TRIG, CHOLHDL, LDLDIRECT in the last 72 hours. Thyroid Function Tests: No results for input(s): TSH, T4TOTAL, FREET4, T3FREE, THYROIDAB in the last 72 hours. Anemia Panel: No results for input(s): VITAMINB12, FOLATE, FERRITIN, TIBC, IRON, RETICCTPCT in the last 72 hours. Urine analysis:    Component Value Date/Time   COLORURINE YELLOW 10/27/2016 1943   APPEARANCEUR CLEAR 10/27/2016 1943   LABSPEC 1.012 10/27/2016 1943   LABSPEC 1.025 02/13/2011 1533   PHURINE 8.0 10/27/2016 1943   GLUCOSEU 150 (A) 10/27/2016 [place] 10/27/2016 [place] 10/27/2016 1943   BILIRUBINUR Negative 02/13/2011 1533   KETONESUR NEGATIVE 10/27/2016 1943   PROTEINUR 100 (A) 10/27/2016 1943   UROBILINOGEN 0.2 04/28/2011 1045   NITRITE NEGATIVE 10/27/2016 1943   LEUKOCYTESUR NEGATIVE 10/27/2016 1943   LEUKOCYTESUR Negative 02/13/2011 1533    Radiological Exams on Admission: Dg Chest Port 1 View  Result Date: 02/06/2018 CLINICAL DATA:  Weakness  and dizziness.  Renal failure. EXAM: PORTABLE CHEST 1 VIEW COMPARISON:  July 06, 2017 FINDINGS: There is cardiomegaly. There is slight pulmonary venous hypertension. There is no edema or consolidation. There is aortic atherosclerosis. No evident adenopathy. No bone lesions. IMPRESSION: Cardiomegaly with a degree of pulmonary vascular congestion. No edema or consolidation. There is aortic atherosclerosis. Aortic Atherosclerosis (ICD10-I70.0). Electronically Signed   By: Lowella Grip III M.D.   On: 02/06/2018 15:11    EKG: Independently reviewed. Sinus rhythm, left ventricular hypertrophy with hypertrophy lateral T-wave inversions ( I and aVL)  Assessment/Plan Active Problems:   Anemia   Acute blood loss anemia  67 year old female who presented with dyspnea, easy fatigability, generalized weakness and a near syncopal episode while ambulating. Worsening symptoms over last 2 weeks. Denies any hematemesis, melena or hematochezia. On her initial physical examination blood pressure 133/55, heart rate 90, respiratory rate 14, oxygen saturation 95%. Dry mucous membranes, conjunctival pallor, lungs clear to auscultation bilaterally, heart S1-S2 present and rhythmic, 3/6 systolic murmur at the apex, abdomen protuberant, nontender nondistended, no lower extremity edema. Sodium 140, potassium 4.4, chloride 97, bicarbonate 27, glucose 234, creatinine 8.3, point-of-care troponin 0.07, white count 8.8, hemoglobin 5.5, hematocrit 17.7, platelets 219, INR 1.1. Chest ray with no infiltrates.   Patient will be admitted to the medical unit with a working diagnosis of symptomatic anemia.   1. Symptomatic anemia with suspected acute blood loss anemia due to suspected recurrent upper GI bleed.. Patient will be admitted to the medical unit, she will be placed on a remote telemetry monitoring, will transfuse 2 units packed red blood cells, and follow hemoglobin and hematocrit. There has been no macroscopic bleeding but  hemoccult stools were positive. Last admission underwent upper endoscopy  finding two esophageal ulcers which were oozing blood, hemostasis clips were placed. She has been receiving darbepoetin and iron in the hemodialysis unit. Due to the degree of anemia patient may need repeat upper endoscopy. GI will be consulted. Will place patient on pantoprazole intravenous twice daily, clear liquid diet for now. Currently she is hemodynamically stable.   2. End-stage  renal disease on hemodialysis. Patient had dialysis yesterday, clinically euvolemic, continue doxercalciferol and fosrenol.   3. HTN. Will continue blood pressure with amlodipine, carvedilol, lisinopril and clonidine.  4. Type 2 diabetes mellitus. Patient will be on clear liquid diet, will place on insulin sliding-scale monitoring. Hold on long acting insulin for now.   5. Anemia of chronic renal disease. Continue darbapoetin, will check iron panel.                  DVT prophylaxis:  scd Code Status:  full  Family Communication: I spoke with patient's sister at the bedside and all questions were addressed.   Disposition Plan:  home  Consults called: GI   Admission status:  Inpatient    Deakon Frix Gerome Apley MD [facility] Pager [phone]  If 7PM-7AM, please contact night-coverage [email or web address] Password [email or web address]  02/06/2018, 5:11 PM

## 2018-02-07 ENCOUNTER — Inpatient Hospital Stay (HOSPITAL_COMMUNITY): Payer: Medicare Other | Admitting: Certified Registered Nurse Anesthetist

## 2018-02-07 ENCOUNTER — Encounter (HOSPITAL_COMMUNITY): Payer: Self-pay | Admitting: Certified Registered Nurse Anesthetist

## 2018-02-07 ENCOUNTER — Encounter (HOSPITAL_COMMUNITY)
Admission: EM | Disposition: A | Discharge status: Home / Self Care | Payer: Self-pay | Source: Home / Self Care | Attending: Internal Medicine

## 2018-02-07 DIAGNOSIS — R195 Other fecal abnormalities: Secondary | ICD-10-CM

## 2018-02-07 DIAGNOSIS — D62 Acute posthemorrhagic anemia: Secondary | ICD-10-CM

## 2018-02-07 DIAGNOSIS — Z992 Dependence on renal dialysis: Secondary | ICD-10-CM

## 2018-02-07 DIAGNOSIS — D649 Anemia, unspecified: Secondary | ICD-10-CM

## 2018-02-07 DIAGNOSIS — N186 End stage renal disease: Secondary | ICD-10-CM

## 2018-02-07 DIAGNOSIS — K31811 Angiodysplasia of stomach and duodenum with bleeding: Principal | ICD-10-CM

## 2018-02-07 HISTORY — PX: ESOPHAGOGASTRODUODENOSCOPY (EGD) WITH PROPOFOL: SHX5813

## 2018-02-07 LAB — GLUCOSE, CAPILLARY
GLUCOSE-CAPILLARY: 146 mg/dL — AB (ref 70–99)
GLUCOSE-CAPILLARY: 181 mg/dL — AB (ref 70–99)
GLUCOSE-CAPILLARY: 176 mg/dL — AB (ref 70–99)
Glucose-Capillary: 173 mg/dL — ABNORMAL HIGH (ref 70–99)

## 2018-02-07 LAB — BASIC METABOLIC PANEL
ANION GAP: 15 (ref 5–15)
BUN: 99 mg/dL — ABNORMAL HIGH (ref 8–23)
CO2: 24 mmol/L (ref 22–32)
Calcium: 8.3 mg/dL — ABNORMAL LOW (ref 8.9–10.3)
Chloride: 99 mmol/L (ref 98–111)
Creatinine, Ser: 10.14 mg/dL — ABNORMAL HIGH (ref 0.44–1.00)
GFR calc Af Amer: 4 mL/min — ABNORMAL LOW (ref >60)
GFR, EST NON AFRICAN AMERICAN: 4 mL/min — AB (ref >60)
GLUCOSE: 197 mg/dL — AB (ref 70–99)
POTASSIUM: 4.9 mmol/L (ref 3.5–5.1)
Sodium: 138 mmol/L (ref 135–145)

## 2018-02-07 LAB — MRSA PCR SCREENING: MRSA by PCR: NEGATIVE

## 2018-02-07 SURGERY — ESOPHAGOGASTRODUODENOSCOPY (EGD) WITH PROPOFOL
Anesthesia: Monitor Anesthesia Care

## 2018-02-07 MED ORDER — PROPOFOL 500 MG/50ML IV EMUL
INTRAVENOUS | Status: DC | PRN
  Administered 2018-02-07: 80 ug/kg/min via INTRAVENOUS

## 2018-02-07 MED ORDER — GABAPENTIN 300 MG PO CAPS
300.0000 mg | ORAL_CAPSULE | Freq: Once | ORAL | Status: AC
  Administered 2018-02-07: 300 mg via ORAL
  Filled 2018-02-07: qty 1

## 2018-02-07 MED ORDER — SODIUM CHLORIDE 0.9 % IV SOLN
INTRAVENOUS | Status: DC
  Administered 2018-02-07: 10:00:00 via INTRAVENOUS

## 2018-02-07 MED ORDER — LIDOCAINE HCL (CARDIAC) PF 100 MG/5ML IV SOSY
PREFILLED_SYRINGE | INTRAVENOUS | Status: DC | PRN
  Administered 2018-02-07: 30 mg via INTRATRACHEAL

## 2018-02-07 MED ORDER — PANTOPRAZOLE SODIUM 40 MG PO TBEC
40.0000 mg | DELAYED_RELEASE_TABLET | Freq: Every day | ORAL | Status: DC
Start: 2018-02-07 — End: 2018-02-09
  Administered 2018-02-07 – 2018-02-09 (×3): 40 mg via ORAL
  Filled 2018-02-07 (×5): qty 1

## 2018-02-07 MED ORDER — GLUCAGON HCL RDNA (DIAGNOSTIC) 1 MG IJ SOLR
INTRAMUSCULAR | Status: AC
  Filled 2018-02-07: qty 1

## 2018-02-07 MED ORDER — PHENYLEPHRINE HCL 10 MG/ML IJ SOLN
INTRAMUSCULAR | Status: DC | PRN
  Administered 2018-02-07: 100 ug via INTRAVENOUS
  Administered 2018-02-07: 60 ug via INTRAVENOUS

## 2018-02-07 MED ORDER — GLUCAGON HCL RDNA (DIAGNOSTIC) 1 MG IJ SOLR
INTRAMUSCULAR | Status: DC | PRN
  Administered 2018-02-07: .5 mg via INTRAVENOUS

## 2018-02-07 MED ORDER — ALBUTEROL SULFATE (2.5 MG/3ML) 0.083% IN NEBU: 2.5000 mg | INHALATION_SOLUTION | RESPIRATORY_TRACT | Status: DC | PRN

## 2018-02-07 MED ORDER — SODIUM CHLORIDE 0.9 % IV SOLN: 10.0000 mL/h | Freq: Once | INTRAVENOUS | Status: DC

## 2018-02-07 MED ORDER — RENA-VITE PO TABS
1.0000 | ORAL_TABLET | Freq: Every day | ORAL | Status: DC
  Administered 2018-02-07 – 2018-02-08 (×2): 1 via ORAL
  Filled 2018-02-07 (×2): qty 1

## 2018-02-07 SURGICAL SUPPLY — 15 items

## 2018-02-07 NOTE — Anesthesia Preprocedure Evaluation (Signed)
Anesthesia Evaluation  Patient identified by MRN, date of birth, ID band Patient awake    Reviewed: Allergy & Precautions, H&P , NPO status , Patient's Chart, lab work & pertinent test results  Airway Mallampati: II   Neck ROM: full    Dental   Pulmonary shortness of breath,    breath sounds clear to auscultation       Cardiovascular hypertension, +CHF   Rhythm:regular Rate:Normal     Neuro/Psych  Headaches, Anxiety  Neuromuscular disease    GI/Hepatic PUD, GERD  ,GI bleed   Endo/Other  diabetes, Type 2  Renal/GU ESRF and DialysisRenal disease     Musculoskeletal  (+) Arthritis ,   Abdominal   Peds  Hematology  (+) anemia ,   Anesthesia Other Findings   Reproductive/Obstetrics                             Anesthesia Physical Anesthesia Plan  ASA: IV  Anesthesia Plan: MAC   Post-op Pain Management:    Induction: Intravenous  PONV Risk Score and Plan: 2 and Ondansetron, Propofol infusion and Treatment may vary due to age or medical condition  Airway Management Planned: Nasal Cannula  Additional Equipment:   Intra-op Plan:   Post-operative Plan:   Informed Consent: I have reviewed the patients History and Physical, chart, labs and discussed the procedure including the risks, benefits and alternatives for the proposed anesthesia with the patient or authorized representative who has indicated his/her understanding and acceptance.     Plan Discussed with: CRNA, Anesthesiologist and Surgeon  Anesthesia Plan Comments:         Anesthesia Quick Evaluation

## 2018-02-07 NOTE — Op Note (Signed)
[facility] Patient Name: [name] Procedure Date : 02/07/2018 MRN: [id number] Attending MD: [name] , MD Date of Birth: 07-Nov-1950 CSN: [id number] Age: 67 Admit Type: Inpatient Procedure:                Upper GI endoscopy Indications:              Acute post hemorrhagic anemia, Heme positive stool Providers:                Lajuan Lines. Hilarie Fredrickson, MD, Zenon Mayo, RN, Nevin Bloodgood, Technician, Gala Lewandowsky, CRNA Referring MD:             [facility] Medicines:                Monitored Anesthesia Care Complications:            No immediate complications. Estimated Blood Loss:     Estimated blood loss: none. Procedure:                Pre-Anesthesia Assessment:                           - Prior to the procedure, a History and Physical                            was performed, and patient medications and                            allergies were reviewed. The patient's tolerance of                            previous anesthesia was also reviewed. The risks                            and benefits of the procedure and the sedation                            options and risks were discussed with the patient.                            All questions were answered, and informed consent                            was obtained. Prior Anticoagulants: The patient has                            taken no previous anticoagulant or antiplatelet                            agents. ASA Grade Assessment: III - A patient with                            severe systemic disease. After reviewing the risks  and benefits, the patient was deemed in                            satisfactory condition to undergo the procedure.                           After obtaining informed consent, the endoscope was                            passed under direct vision. Throughout the                            procedure, the patient's blood  pressure, pulse, and                            oxygen saturations were monitored continuously. The                            was introduced through the mouth, and advanced to                            the third part of duodenum. The upper GI endoscopy                            was accomplished without difficulty. The patient                            tolerated the procedure well. Scope In: Scope Out: Findings:      The examined esophagus was normal.      The entire examined stomach was normal.      Red blood was found in the entire duodenum.      A single medium angioectasia with bleeding was found in the distal       second portion of the duodenum. Fulguration to stop the bleeding by       argon plasma (Erbe right colon setting) at 1 liter/minute and 20 watts       was successful. To enhance hemostasis, two hemostatic clips were       successfully placed over the area treated by APC. Bleeding had stopped       at the end of the procedure.      The exam of the duodenum was otherwise normal with some limitation from       previous bleeding. Impression:               - Normal esophagus.                           - Normal stomach.                           - Blood in the entire examined duodenum.                           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  Treated with argon plasma coagulation (APC) and                            hemostatic clip x 2.                           - No specimens collected. Moderate Sedation:      N/A Recommendation:           - Return patient to hospital ward for ongoing care.                           - Soft diet.                           - Continue present medications.                           - Daily PPI as part of home regimen. (location of                            bleeding is distal to any acid affect).                           - Follow-up Hgb after transfusion.                           - No  NSAIDs.                           - Screening colonoscopy should be considered as an                            outpatient. Procedure Code(s):        --- Professional ---                           [phone], Esophagogastroduodenoscopy, flexible,                            transoral; with control of bleeding, any method Diagnosis Code(s):        --- Professional ---                           K92.2, Gastrointestinal hemorrhage, unspecified                           K31.811, Angiodysplasia of stomach and duodenum                            with bleeding                           D62, Acute posthemorrhagic anemia                           R19.5, Other fecal abnormalities CPT copyright 2017 [employer]. All rights reserved. The codes documented in this  report are preliminary and upon coder review may  be revised to meet current compliance requirements. [name], MD 02/07/2018 11:00:18 AM This report has been signed electronically. Number of Addenda: 0

## 2018-02-07 NOTE — Consult Note (Signed)
Referring Provider:  Dr. Cathlean Sauer ([facility]) Primary Care Physician:  Prince Solian, MD Primary Gastroenterologist:  Dr. Carlean Purl  Reason for Consultation:  Heme + stools and acute anemia  HPI: [name] is a 67 y.o. female with history of end-stage renal disease on dialysis, history of esophagitis with esophageal ulceration causing upper GI bleeding in December 2018 treated with endoscopic clipping by Dr. Carlean Purl, diabetes with nephropathy, history of breast cancer, CHF with EF 45-50, history of anemia, blindness admitted with 2 weeks of generalized weakness found to have acute on chronic anemia.  Hemoglobin on presentation was 5.5.  Stools were noted to be heme positive.  Patient states that she has been feeling weakness and dizziness/lightheadedness on and off for 2 weeks.  Was having some nausea and vomiting associated with dialysis but outside of her dialysis sessions denies nausea, vomiting.  Appetite has been normal for her.  She has been eating and completing her meals.  Denies heartburn.  With her blindness she obviously cannot look at her stool but her family member states that she does observe her stool and has not seen overt blood or melena.  Patient states that last week on Friday she was told her hemoglobin was 10 but she is not sure this is true because of how weak she was feeling and how low her hemoglobin was found to be on presentation to the hospital.  She denies diarrhea and constipation.  Reports bowel habits is regular for her.  She has never had a colonoscopy.  She reports she does intermittently have oral iron and EPO at dialysis.  She is receiving the third unit of packed red cells since presentation.  No follow-up Hgb to this point as last unit is hanging.   Past Medical History:  Diagnosis Date  . Anemia   . Anxiety   . Arthritis    "joints" (06/15/2013)  . Blood transfusion without reported diagnosis   . Breast cancer ([place])    left  . CHF  (congestive heart failure) ([place])   . ESRD (end stage renal disease) ([place])    "suppose to start dialysis today" (06/15/2013)  . GERD (gastroesophageal reflux disease)   . Hypertension   . Myalgia 12/31/2011  . Neuropathy 12/31/2011  . Shortness of breath    "when she doesn't go to dialysis"  . Type II diabetes mellitus ([place])     Past Surgical History:  Procedure Laterality Date  . ABDOMINAL HYSTERECTOMY     partial  . AV FISTULA PLACEMENT Right 06/06/2013   Procedure: ARTERIOVENOUS (AV) FISTULA CREATION-RIGHT BRACHIAL CEPHALIC;  Surgeon: Conrad Albert Lea, MD;  Location: [facility];  Service: Vascular;  Laterality: Right;  . BREAST BIOPSY Left   . BREAST LUMPECTOMY Left    "and took out some lymph nodes" (06/15/2013)  . CARPAL TUNNEL RELEASE Left 11/26/2017   Procedure: LEFT CARPAL TUNNEL RELEASE;  Surgeon: [name], MD;  Location: [place];  Service: Orthopedics;  Laterality: Left;  . CATARACT EXTRACTION W/ ANTERIOR VITRECTOMY Bilateral   . CESAREAN SECTION  1980  . ESOPHAGOGASTRODUODENOSCOPY N/A 07/08/2017   Procedure: ESOPHAGOGASTRODUODENOSCOPY (EGD);  Surgeon: [name], MD;  Location: [facility];  Service: Endoscopy;  Laterality: N/A;  . EYE SURGERY Bilateral    laser surgery  . PARS PLANA VITRECTOMY Right 05/05/2017   Procedure: PARS PLANA VITRECTOMY WITH 25 GAUGE; PARTIAL REMOVAL OF OIL; INFERIOR PERIPHERAL IRIDECTOMY, REFORM ANTERIOR CHAMBER RIGHT EYE;  Surgeon: [name], MD;  Location: [place];  Service: Ophthalmology;  Laterality: Right;  . REFRACTIVE SURGERY Bilateral   . REMOVAL OF A DIALYSIS CATHETER Right 06/06/2013   Procedure: REMOVAL OF RIGHT MEDIPORT;  Surgeon: Conrad Pineville, MD;  Location: [place];  Service: Vascular;  Laterality: Right;  . TONSILLECTOMY      Prior to Admission medications   Medication Sig Start Date End Date Taking? Authorizing Provider  acetaminophen (TYLENOL) 500 MG tablet Take 500 mg by mouth 2 (two) times daily.   Yes [provider]  albuterol (PROVENTIL) (2.5 MG/3ML) 0.083% nebulizer solution Take 3 mLs (2.5 mg total) by nebulization every 6 (six) hours as needed for wheezing or shortness of breath. 10/30/16  Yes Theodis Blaze, MD  amLODipine (NORVASC) 10 MG tablet Take 10 mg at bedtime by mouth. 06/02/17  Yes [provider]  aspirin 81 MG chewable tablet Chew 81 mg by mouth daily with lunch.    Yes [provider]  B Complex-C-Folic Acid (RENA-VITE RX) 1 MG TABS Take 1 tablet by mouth daily with lunch.  09/29/16  Yes [provider]  carvedilol (COREG) 12.5 MG tablet Take 12.5 mg by mouth 2 (two) times daily with a meal.   Yes [provider]  COMBIGAN 0.2-0.5 % ophthalmic solution Place 1 drop into the right eye every 12 (twelve) hours.  10/23/17  Yes [provider]  diphenhydrAMINE (BENADRYL) 25 MG tablet Take 12.5 mg by mouth every Monday, Wednesday, and Friday.   Yes [provider]  docusate sodium (COLACE) 100 MG capsule Take 100 mg by mouth 2 (two) times daily.    Yes [provider]  fluticasone (FLONASE) 50 MCG/ACT nasal spray Place 1-2 sprays into both nostrils daily as needed for allergies or rhinitis.    Yes [provider]  gabapentin (NEURONTIN) 100 MG capsule Take 100 mg by mouth 2 (two) times daily.   Yes [provider]  insulin aspart (NOVOLOG FLEXPEN) 100 UNIT/ML FlexPen Inject 10-16 Units into the skin See admin instructions. Three times daily per Sliding scale   Yes [provider]  lanthanum (FOSRENOL) 1000 MG chewable tablet Chew 2,000 mg by mouth 3 (three) times daily with meals. Take 1 tablet with snack   Yes [provider]  lisinopril (PRINIVIL,ZESTRIL) 40 MG tablet Take 40 mg by mouth at bedtime.    Yes [provider]  prednisoLONE acetate (PRED FORTE) 1 % ophthalmic suspension Place 1 drop into the right eye 2 (two) times daily.  04/27/17  Yes [provider]    PRESCRIPTION MEDICATION Take 1 tablet by mouth 2 (two) times daily. ACID REFLUX MED   Yes [provider]  atropine 1 % ophthalmic solution Place 1 drop into the right eye 2 (two) times daily. 07/04/17   [provider]  cloNIDine (CATAPRES) 0.2 MG tablet Take 0.2 mg by mouth 2 (two) times daily. with lunch and at bedtime    [provider]  darbepoetin (ARANESP) 200 MCG/0.4ML SOLN injection Inject 0.4 mLs (200 mcg total) into the vein every Wednesday with hemodialysis. 06/22/13   Geradine Girt, DO  doxercalciferol (HECTOROL) 4 MCG/2ML injection Inject 0.5 mLs (1 mcg total) into the vein every Monday, Wednesday, and Friday with hemodialysis. 06/18/13   Geradine Girt, DO  HYDROcodone-acetaminophen (NORCO/VICODIN) 5-325 MG tablet Take 1-2 tablets by mouth every 4 (four) hours as needed for moderate pain. Patient not taking: Reported on 02/06/2018 11/26/17   [name], MD  omeprazole (PRILOSEC) 40 MG capsule  Take 40 mg by mouth 2 (two) times daily.     [provider]  [facility] VERIO test strip 3 (three) times daily. for testing 12/15/17   [provider]    Current Facility-Administered Medications  Medication Dose Route Frequency Provider Last Rate Last Dose  . 0.9 %  sodium chloride infusion  10 mL/hr Intravenous Once Charlesetta Shanks, MD      . acetaminophen (TYLENOL) tablet 500 mg  500 mg Oral BID Tawni Millers, MD   500 mg at 02/06/18 2148  . albuterol (PROVENTIL) (2.5 MG/3ML) 0.083% nebulizer solution 2.5 mg  2.5 mg Nebulization Q6H PRN Arrien, Jimmy Picket, MD      . amLODipine (NORVASC) tablet 10 mg  10 mg Oral QHS Tawni Millers, MD   10 mg at 02/06/18 2149  . brimonidine (ALPHAGAN) 0.2 % ophthalmic solution 1 drop  1 drop Right Eye BID Arrien, Jimmy Picket, MD       And  . timolol (TIMOPTIC) 0.5 % ophthalmic solution 1 drop  1 drop Right Eye BID Arrien, Jimmy Picket, MD   1 drop at 02/06/18 2149  .  carvedilol (COREG) tablet 12.5 mg  12.5 mg Oral BID WC Arrien, Jimmy Picket, MD   12.5 mg at 02/07/18 0806  . [START ON 02/08/2018] diphenhydrAMINE (BENADRYL) tablet 12.5 mg  12.5 mg Oral Q M,W,F Arrien, Jimmy Picket, MD      . docusate sodium (COLACE) capsule 100 mg  100 mg Oral BID Arrien, Jimmy Picket, MD      . fluticasone [facility]) 50 MCG/ACT nasal spray 1-2 spray  1-2 spray Each Nare Daily PRN Arrien, Jimmy Picket, MD      . gabapentin (NEURONTIN) capsule 100 mg  100 mg Oral BID Tawni Millers, MD   100 mg at 02/06/18 2148  . insulin aspart (novoLOG) injection 0-9 Units  0-9 Units Subcutaneous TID WC Arrien, Jimmy Picket, MD   1 Units at 02/07/18 [phone]  . lanthanum (FOSRENOL) chewable tablet 2,000 mg  2,000 mg Oral TID WC Arrien, Jimmy Picket, MD      . lisinopril (PRINIVIL,ZESTRIL) tablet 40 mg  40 mg Oral QHS Tawni Millers, MD   40 mg at 02/06/18 2149  . multivitamin (RENA-VIT) tablet 1 tablet  1 tablet Oral Q lunch Arrien, Jimmy Picket, MD      . ondansetron [facility]) tablet 4 mg  4 mg Oral Q6H PRN Arrien, Jimmy Picket, MD       Or  . ondansetron [facility]) injection 4 mg  4 mg Intravenous Q6H PRN Arrien, Jimmy Picket, MD      . pantoprazole (PROTONIX) injection 40 mg  40 mg Intravenous Q12H Arrien, Jimmy Picket, MD      . prednisoLONE acetate (PRED FORTE) 1 % ophthalmic suspension 1 drop  1 drop Right Eye QID Arrien, Jimmy Picket, MD   1 drop at 02/06/18 2149    Allergies as of 02/06/2018 - Review Complete 02/06/2018  Allergen Reaction Noted  . Latex Hives 11/01/2014  . Oxycodone Other (See Comments) 08/28/2016  . Tramadol Other (See Comments) 08/28/2016  . Tape Rash 11/24/2017  . Vicodin [hydrocodone-acetaminophen] Other (See Comments) 05/05/2017    Family History  Problem Relation Age of Onset  . Diabetes Mother   . Hyperlipidemia Mother   . Hypertension Mother   . Hypertension Father   . Diabetes Sister   . Diabetes Brother     . Hypertension Brother   . Heart attack Brother   .  Kidney disease Brother   . Colon cancer Neg Hx   . Colon polyps Neg Hx   . Esophageal cancer Neg Hx   . Gallbladder disease Neg Hx     Social History   Socioeconomic History  . Marital status: Single    Spouse name: Not on file  . Number of children: 2  . Years of education: Not on file  . Highest education level: Not on file  Occupational History  . Occupation: Diabled  Social Needs  . Financial resource strain: Not on file  . Food insecurity:    Worry: Not on file    Inability: Not on file  . Transportation needs:    Medical: Not on file    Non-medical: Not on file  Tobacco Use  . Smoking status: Never Smoker  . Smokeless tobacco: Never Used  Substance and Sexual Activity  . Alcohol use: No  . Drug use: No  . Sexual activity: Never  Social History Narrative   She reports she is single with one son and one daughter. 3 caffeinated beverages a day. She dialyzes Monday Wednesday Friday at the [facility].   11/01/2014    Review of Systems: As per HPI, other systems asked and negative  Physical Exam: Vital signs in last 24 hours: Temp:  [98.1 F (36.7 C)-99.5 F (37.5 C)] 98.7 F (37.1 C) (07/21 0751) Pulse Rate:  [74-98] 95 (07/21 0751) Resp:  [12-21] 18 (07/21 0751) BP: (102-161)/(46-112) 152/69 (07/21 0751) SpO2:  [90 %-100 %] 93 % (07/21 0751) Last BM Date: 02/06/18 Gen: awake, alert, NAD HEENT: anicteric, op clear CV: RRR, sem Pulm: CTA b/l Abd: soft, obese, mild tenderness upper and mid abd without rebound or guarding, ND, +BS throughout Ext: no c/c/e Neuro: nonfocal Psych: normal mood and affect    Lab Results: Recent Labs    02/06/18 1334  WBC 8.8  HGB 5.5*  HCT 17.7*  PLT 219   BMET Recent Labs    02/06/18 1334  NA 140  K 4.4  CL 97*  CO2 27  GLUCOSE 234*  BUN 69*  CREATININE 8.35*  CALCIUM 8.6*  PT/INR Recent Labs    02/06/18 1612  LABPROT 14.4  INR 1.13      Studies/Results: Dg Chest Port 1 View  Result Date: 02/06/2018 CLINICAL DATA:  Weakness and dizziness.  Renal failure. EXAM: PORTABLE CHEST 1 VIEW COMPARISON:  July 06, 2017 FINDINGS: There is cardiomegaly. There is slight pulmonary venous hypertension. There is no edema or consolidation. There is aortic atherosclerosis. No evident adenopathy. No bone lesions. IMPRESSION: Cardiomegaly with a degree of pulmonary vascular congestion. No edema or consolidation. There is aortic atherosclerosis. Aortic Atherosclerosis (ICD10-I70.0). Electronically Signed   By: Lowella Grip III M.D.   On: 02/06/2018 15:11    IMPRESSION:  67 y.o. female with history of end-stage renal disease on dialysis, history of esophagitis with esophageal ulceration causing upper GI bleeding in December 2018 treated with endoscopic clipping by Dr. Carlean Purl, diabetes with nephropathy, history of breast cancer, CHF with EF 45-50, history of anemia, blindness admitted with 2 weeks of generalized weakness found to have acute on chronic anemia.   PLAN:  1.  Acute symptomatic anemia with heme positive stool --I have recommended we repeat upper endoscopy today with monitored anesthesia care.  She had an upper GI bleed from esophageal ulceration requiring endoscopic therapy in December 2018.   Continue twice daily PPI for now  If upper endoscopy is negative we would  need to consider colonoscopy as the next diagnostic step.  Follow-up hemoglobin/monitor response to transfusion.  IV iron and EPO per dialysis team.  2.  ESRD --HD per nephrology  Lajuan Lines Trung Wenzl  02/07/2018, 9:06 AM

## 2018-02-07 NOTE — Transfer of Care (Signed)
Immediate Anesthesia Transfer of Care Note  Patient: [name]  Procedure(s) Performed: ESOPHAGOGASTRODUODENOSCOPY (EGD) WITH PROPOFOL (N/A )  Patient Location: PACU and Endoscopy Unit  Anesthesia Type:MAC  Level of Consciousness: drowsy, patient cooperative and responds to stimulation  Airway & Oxygen Therapy: [name]  Post-op Assessment: Report given to RN and Post -op Vital signs reviewed and stable  Post vital signs: Reviewed and stable  Last Vitals:  Vitals Value Taken Time  BP    Temp    Pulse    Resp    SpO2      Last Pain:  Vitals:   02/07/18 1007  TempSrc: Oral  PainSc: 0-No pain         Complications: No apparent anesthesia complications

## 2018-02-07 NOTE — Anesthesia Procedure Notes (Signed)
Procedure Name: MAC Date/Time: 02/07/2018 10:18 AM Performed by: Oletta Lamas, CRNA Pre-anesthesia Checklist: Patient identified, Emergency Drugs available, Suction available, Timeout performed and Patient being monitored Patient Re-evaluated:Patient Re-evaluated prior to induction Oxygen Delivery Method: Nasal cannula

## 2018-02-07 NOTE — Progress Notes (Signed)
[name]  WUX:[id number] DOB: January 08, 1951 DOA: 02/06/2018 PCP: Prince Solian, MD    Brief Narrative:  67 y.o. female with a hx of ESRD on hemodialysis (M-W-F), systolic heart failure, mitral regurgitation, pulmonary hypertension, DM2, HTN, and legal blindness who presented w/ a 2 week history of generalized weakness, decreased balance, easy fatigability, and lethargy.   She had a hospitalization in December 2018 with similar symptoms, and was diagnosed with an UGIB due to esophageal ulcers.  She received 7 units of packed red blood cells.   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  She denies abdom pain, sob, n/v, or chest pain.    Assessment & Plan:  Symptomatic anemia due to acute blood loss in setting of anemia of CKD Hgb was 12.23 Nov 2017 - s/p 3U PRBC - follow trend until clearly stable - transfuse as needed to keep Hgb > 6.9  Recent Labs  Lab 02/06/18 1334 02/07/18 1146  HGB 5.5* 8.6*    Recurrent UGIB - duodenal AVM guaiac + - EGD 7/21 noted normal esophagus and stomach w/ bood in entire duodenum and a single bleeding AVM s/p APC w/ hemoclip x2 - follow to assure no further bleeding   ESRD on HD Nephrology contacted as pt will still be in hospital 7/22  HTN Follow w/o change in tx plan   DM2 CBG reasonably controlled - follow w/o change for now   DVT prophylaxis: SCDs Code Status: FULL CODE Family Communication: spoke w/ family at bedside  Disposition Plan: d/c home when Hgb proves to be stable   Consultants:  GI  Antimicrobials:  none  Objective: Blood pressure 131/62, pulse 80, temperature 98.4 F (36.9 C), temperature source Oral, resp. rate 20, SpO2 94 %.  Intake/Output Summary (Last 24 hours) at 02/07/2018 1406 Last data filed at 02/07/2018 1055 Gross per 24 hour  Intake 1883 ml  Output 300 ml  Net 1583 ml   There were no vitals filed for this  visit.  Examination: General: No acute respiratory distress Lungs: Clear to auscultation bilaterally without wheezes or crackles Cardiovascular: Regular rate and rhythm without murmur gallop or rub normal S1 and S2 Abdomen: Nontender, nondistended, soft, bowel sounds positive, no rebound, no ascites, no appreciable mass Extremities: No significant cyanosis, clubbing, or edema bilateral lower extremities  CBC: Recent Labs  Lab 02/06/18 1334 02/07/18 1146  WBC 8.8 16.0*  NEUTROABS 6.7  --   HGB 5.5* 8.6*  HCT 17.7* 25.9*  MCV 100.0 91.5  PLT 219 253   Basic Metabolic Panel: Recent Labs  Lab 02/06/18 1334 02/07/18 1146  NA 140 138  K 4.4 4.9  CL 97* 99  CO2 27 24  GLUCOSE 234* 197*  BUN 69* 99*  CREATININE 8.35* 10.14*  CALCIUM 8.6* 8.3*   GFR: CrCl cannot be calculated (Unknown ideal weight.).  Liver Function Tests: No results for input(s): AST, ALT, ALKPHOS, BILITOT, PROT, ALBUMIN in the last 168 hours. No results for input(s): LIPASE, AMYLASE in the last 168 hours. No results for input(s): AMMONIA in the last 168 hours.  Coagulation Profile: Recent Labs  Lab 02/06/18 1612  INR 1.13    HbA1C: Hgb A1c MFr Bld  Date/Time Value Ref Range Status  11/24/2017 09:03 AM 5.9 (H) 4.8 - 5.6 % Final    Comment:    (NOTE) Pre diabetes:          5.7%-6.4% Diabetes:              >  6.4% Glycemic control for   <7.0% adults with diabetes   06/05/2017 03:22 AM 7.3 (H) 4.8 - 5.6 % Final    Comment:    (NOTE) Pre diabetes:          5.7%-6.4% Diabetes:              >6.4% Glycemic control for   <7.0% adults with diabetes     CBG: Recent Labs  Lab 02/06/18 1907 02/06/18 2055 02/07/18 0749 02/07/18 1214  GLUCAP 154* 155* 146* 173*    Recent Results (from the past 240 hour(s))  MRSA PCR Screening     Status: None   Collection Time: 02/06/18 11:13 PM  Result Value Ref Range Status   MRSA by PCR NEGATIVE NEGATIVE Final    Comment:        The GeneXpert MRSA  Assay (FDA approved for NASAL specimens only), is one component of a comprehensive MRSA colonization surveillance program. It is not intended to diagnose MRSA infection nor to guide or monitor treatment for MRSA infections. Performed at [facility], [place] [address] [place], [place] [address]      Scheduled Meds: . acetaminophen  500 mg Oral BID  . amLODipine  10 mg Oral QHS  . brimonidine  1 drop Right Eye BID   And  . timolol  1 drop Right Eye BID  . carvedilol  12.5 mg Oral BID WC  . [START ON 02/08/2018] diphenhydrAMINE  12.5 mg Oral Q M,W,F  . docusate sodium  100 mg Oral BID  . gabapentin  100 mg Oral BID  . insulin aspart  0-9 Units Subcutaneous TID WC  . lanthanum  2,000 mg Oral TID WC  . lisinopril  40 mg Oral QHS  . multivitamin  1 tablet Oral QHS  . pantoprazole  40 mg Oral Q0600  . prednisoLONE acetate  1 drop Right Eye QID     LOS: 1 day   Cherene Altes, MD [facility] Office  [phone] Pager - Text Page per Amion  If 7PM-7AM, please contact night-coverage per Amion 02/07/2018, 2:06 PM

## 2018-02-08 ENCOUNTER — Encounter: Payer: Self-pay | Admitting: Nurse Practitioner

## 2018-02-08 ENCOUNTER — Encounter (HOSPITAL_COMMUNITY): Payer: Self-pay | Admitting: Internal Medicine

## 2018-02-08 LAB — TYPE AND SCREEN
ABO/RH(D): A POS
ANTIBODY SCREEN: NEGATIVE
UNIT DIVISION: 0
Unit division: 0
Unit division: 0

## 2018-02-08 LAB — BPAM RBC
BLOOD PRODUCT EXPIRATION DATE: 201908092359
Blood Product Expiration Date: 201908092359
Blood Product Expiration Date: 201908092359
ISSUE DATE / TIME: 201907202131
ISSUE DATE / TIME: 201907210256
ISSUE DATE / TIME: 201907210607
UNIT TYPE AND RH: 6200
UNIT TYPE AND RH: 6200
Unit Type and Rh: 6200

## 2018-02-08 LAB — CBC
HCT: 27.3 % — ABNORMAL LOW (ref 36.0–46.0)
HEMATOCRIT: 25.9 % — AB (ref 36.0–46.0)
HEMOGLOBIN: 8.6 g/dL — AB (ref 12.0–15.0)
HEMOGLOBIN: 9.1 g/dL — AB (ref 12.0–15.0)
MCH: 30.4 pg (ref 26.0–34.0)
MCH: 31 pg (ref 26.0–34.0)
MCHC: 33.2 g/dL (ref 30.0–36.0)
MCHC: 33.3 g/dL (ref 30.0–36.0)
MCV: 91.5 fL (ref 78.0–100.0)
MCV: 92.9 fL (ref 78.0–100.0)
Platelets: 191 10*3/uL (ref 150–400)
Platelets: 209 10*3/uL (ref 150–400)
RBC: 2.83 MIL/uL — ABNORMAL LOW (ref 3.87–5.11)
RBC: 2.94 MIL/uL — ABNORMAL LOW (ref 3.87–5.11)
RDW: 15.3 % (ref 11.5–15.5)
RDW: 15.8 % — AB (ref 11.5–15.5)
WBC: 16 10*3/uL — ABNORMAL HIGH (ref 4.0–10.5)
WBC: 9.7 10*3/uL (ref 4.0–10.5)

## 2018-02-08 LAB — RENAL FUNCTION PANEL
ALBUMIN: 2.9 g/dL — AB (ref 3.5–5.0)
Anion gap: 18 — ABNORMAL HIGH (ref 5–15)
BUN: 120 mg/dL — ABNORMAL HIGH (ref 8–23)
CHLORIDE: 97 mmol/L — AB (ref 98–111)
CO2: 20 mmol/L — ABNORMAL LOW (ref 22–32)
CREATININE: 12.3 mg/dL — AB (ref 0.44–1.00)
Calcium: 8.6 mg/dL — ABNORMAL LOW (ref 8.9–10.3)
GFR calc non Af Amer: 3 mL/min — ABNORMAL LOW (ref >60)
GFR, EST AFRICAN AMERICAN: 3 mL/min — AB (ref >60)
Glucose, Bld: 179 mg/dL — ABNORMAL HIGH (ref 70–99)
PHOSPHORUS: 6.5 mg/dL — AB (ref 2.5–4.6)
POTASSIUM: 5 mmol/L (ref 3.5–5.1)
Sodium: 135 mmol/L (ref 135–145)

## 2018-02-08 LAB — GLUCOSE, CAPILLARY
GLUCOSE-CAPILLARY: 174 mg/dL — AB (ref 70–99)
GLUCOSE-CAPILLARY: 119 mg/dL — AB (ref 70–99)
GLUCOSE-CAPILLARY: 214 mg/dL — AB (ref 70–99)
Glucose-Capillary: 124 mg/dL — ABNORMAL HIGH (ref 70–99)

## 2018-02-08 LAB — HIV ANTIBODY (ROUTINE TESTING W REFLEX): HIV Screen 4th Generation wRfx: NONREACTIVE

## 2018-02-08 MED ORDER — LIDOCAINE-PRILOCAINE 2.5-2.5 % EX CREA: 1.0000 "application " | TOPICAL_CREAM | CUTANEOUS | Status: DC | PRN

## 2018-02-08 MED ORDER — HEPARIN SODIUM (PORCINE) 1000 UNIT/ML DIALYSIS: 1000.0000 [IU] | INTRAMUSCULAR | Status: DC | PRN

## 2018-02-08 MED ORDER — DIPHENHYDRAMINE HCL 12.5 MG/5ML PO ELIX
12.5000 mg | ORAL_SOLUTION | ORAL | Status: DC
  Administered 2018-02-08: 12.5 mg via ORAL
  Filled 2018-02-08: qty 5

## 2018-02-08 MED ORDER — SODIUM CHLORIDE 0.9 % IV SOLN: 100.0000 mL | INTRAVENOUS | Status: DC | PRN

## 2018-02-08 MED ORDER — CHLORHEXIDINE GLUCONATE CLOTH 2 % EX PADS
6.0000 | MEDICATED_PAD | Freq: Every day | CUTANEOUS | Status: DC
  Administered 2018-02-09: 6 via TOPICAL

## 2018-02-08 MED ORDER — PENTAFLUOROPROP-TETRAFLUOROETH EX AERO: 1.0000 "application " | INHALATION_SPRAY | CUTANEOUS | Status: DC | PRN

## 2018-02-08 MED ORDER — LIDOCAINE HCL (PF) 1 % IJ SOLN: 5.0000 mL | INTRAMUSCULAR | Status: DC | PRN

## 2018-02-08 MED ORDER — ALTEPLASE 2 MG IJ SOLR: 2.0000 mg | Freq: Once | INTRAMUSCULAR | Status: DC | PRN

## 2018-02-08 MED ORDER — DOXERCALCIFEROL 4 MCG/2ML IV SOLN: 4.0000 ug | INTRAVENOUS | Status: DC

## 2018-02-08 NOTE — Consult Note (Addendum)
[facility] Renal Consultation Note    Indication for Consultation:  Management of ESRD/hemodialysis; anemia, hypertension/volume and secondary hyperparathyroidism  XTK:WIOX, Ravisankar, MD  HPI: [name] is a 67 y.o. female. 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  Past medical history significant for DM, HTN, blindness, Hx breast cancer, Hx UGIB in 06/2107 with EGD showing esophagus ulcers and mallory weiss tears.  Patient seen and examined at bedside with family present.  Reports weakness, dizziness, nausea and vomiting starting 3 days ago after dialysis and worsened throughout the night so she came into the hospital the next day.  Patient and family deny dark tarry stool, hematochezia, hemoptysis, hematuria or any other unusual bleeding.  Of note hemoglobin taken at the OP dialysis unit on 7/17 was 9.4 and she received her regularly scheduled dose of Mircera the same day.  Patient was admitted due to symptomatic anemia with a presenting Hgb of 5.5 now increased to 9.1 s/p 3units pRBC.  Other pertinent findings include EGD showing one bleeding angiectasia treated in the duodenum with APC and clip.    Today patient is feeling better.  Denies dizziness, n/v/d, weakness, chest pain, SOB and edema.     Past Medical History:  Diagnosis Date  . Anemia   . Anxiety   . Arthritis    "joints" (06/15/2013)  . Blood transfusion without reported diagnosis   . Breast cancer ([place])    left  . CHF (congestive heart failure) ([place])   . ESRD (end stage renal disease) ([place])    "suppose to start dialysis today" (06/15/2013)  . GERD (gastroesophageal reflux disease)   . Hypertension   . Myalgia 12/31/2011  . Neuropathy 12/31/2011  . Shortness of breath    "when she doesn't go to dialysis"  . Type II diabetes mellitus ([place])    Past Surgical History:  Procedure Laterality Date  . ABDOMINAL HYSTERECTOMY     partial  . AV FISTULA PLACEMENT Right 06/06/2013    Procedure: ARTERIOVENOUS (AV) FISTULA CREATION-RIGHT BRACHIAL CEPHALIC;  Surgeon: Conrad Second [place], MD;  Location: [facility];  Service: Vascular;  Laterality: Right;  . BREAST BIOPSY Left   . BREAST LUMPECTOMY Left    "and took out some lymph nodes" (06/15/2013)  . CARPAL TUNNEL RELEASE Left 11/26/2017   Procedure: LEFT CARPAL TUNNEL RELEASE;  Surgeon: [name], MD;  Location: [facility];  Service: Orthopedics;  Laterality: Left;  . CATARACT EXTRACTION W/ ANTERIOR VITRECTOMY Bilateral   . CESAREAN SECTION  1980  . ESOPHAGOGASTRODUODENOSCOPY N/A 07/08/2017   Procedure: ESOPHAGOGASTRODUODENOSCOPY (EGD);  Surgeon: [name], MD;  Location: [facility] ENDOSCOPY;  Service: Endoscopy;  Laterality: N/A;  . EYE SURGERY Bilateral    laser surgery  . PARS PLANA VITRECTOMY Right 05/05/2017   Procedure: PARS PLANA VITRECTOMY WITH 25 GAUGE; PARTIAL REMOVAL OF OIL; INFERIOR PERIPHERAL IRIDECTOMY, REFORM ANTERIOR CHAMBER RIGHT EYE;  Surgeon: [name], MD;  Location: [facility];  Service: Ophthalmology;  Laterality: Right;  . REFRACTIVE SURGERY Bilateral   . REMOVAL OF A DIALYSIS CATHETER Right 06/06/2013   Procedure: REMOVAL OF RIGHT MEDIPORT;  Surgeon: Conrad Otsego, MD;  Location: [facility];  Service: Vascular;  Laterality: Right;  . TONSILLECTOMY     Family History  Problem Relation Age of Onset  . Diabetes Mother   . Hyperlipidemia Mother   . Hypertension Mother   . Hypertension Father   . Diabetes Sister   . Diabetes Brother   . Hypertension  Brother   . Heart attack Brother   . Kidney disease Brother   . Colon cancer Neg Hx   . Colon polyps Neg Hx   . Esophageal cancer Neg Hx   . Gallbladder disease Neg Hx    Social History:  reports that she has never smoked. She has never used smokeless tobacco. She reports that she does not drink alcohol or use drugs. Allergies  Allergen Reactions  . Latex Hives  . Oxycodone Other (See Comments)    Hallucinations   . Tramadol Other (See Comments)     hallucinations   . Tape Rash    47M Transpore adhesive tape. Medical tape  . Vicodin [Hydrocodone-Acetaminophen] Other (See Comments)    hallucinations   Prior to Admission medications   Medication Sig Start Date End Date Taking? Authorizing Provider  acetaminophen (TYLENOL) 500 MG tablet Take 500 mg by mouth 2 (two) times daily.   Yes [provider]  albuterol (PROVENTIL) (2.5 MG/47ML) 0.083% nebulizer solution Take 3 mLs (2.5 mg total) by nebulization every 6 (six) hours as needed for wheezing or shortness of breath. 10/30/16  Yes Theodis Blaze, MD  amLODipine (NORVASC) 10 MG tablet Take 10 mg at bedtime by mouth. 06/02/17  Yes [provider]  aspirin 81 MG chewable tablet Chew 81 mg by mouth daily with lunch.    Yes [provider]  B Complex-C-Folic Acid (RENA-VITE RX) 1 MG TABS Take 1 tablet by mouth daily with lunch.  09/29/16  Yes [provider]  carvedilol (COREG) 12.5 MG tablet Take 12.5 mg by mouth 2 (two) times daily with a meal.   Yes [provider]  COMBIGAN 0.2-0.5 % ophthalmic solution Place 1 drop into the right eye every 12 (twelve) hours.  10/23/17  Yes [provider]  diphenhydrAMINE (BENADRYL) 25 MG tablet Take 12.5 mg by mouth every Monday, Wednesday, and Friday.   Yes [provider]  docusate sodium (COLACE) 100 MG capsule Take 100 mg by mouth 2 (two) times daily.    Yes [provider]  fluticasone (FLONASE) 50 MCG/ACT nasal spray Place 1-2 sprays into both nostrils daily as needed for allergies or rhinitis.    Yes [provider]  gabapentin (NEURONTIN) 100 MG capsule Take 100 mg by mouth 2 (two) times daily.   Yes [provider]  insulin aspart (NOVOLOG FLEXPEN) 100 UNIT/ML FlexPen Inject 10-16 Units into the skin See admin instructions. Three times daily per Sliding scale   Yes [provider]  lanthanum (FOSRENOL) 1000 MG chewable tablet Chew 2,000 mg by mouth 3 (three)  times daily with meals. Take 1 tablet with snack   Yes [provider]  lisinopril (PRINIVIL,ZESTRIL) 40 MG tablet Take 40 mg by mouth at bedtime.    Yes [provider]  omeprazole (PRILOSEC) 40 MG capsule Take 40 mg by mouth 2 (two) times daily.    Yes [provider]  prednisoLONE acetate (PRED FORTE) 1 % ophthalmic suspension Place 1 drop into the right eye 2 (two) times daily.  04/27/17  Yes [provider]  PRESCRIPTION MEDICATION Take 1 tablet by mouth 2 (two) times daily. ACID REFLUX MED   Yes [provider]  atropine 1 % ophthalmic solution Place 1 drop into the right eye 2 (two) times daily. 07/04/17   [provider]  cloNIDine (CATAPRES) 0.2 MG tablet Take 0.2 mg by mouth 2 (two) times daily. with lunch and at bedtime    [provider]  darbepoetin (ARANESP) 200 MCG/0.4ML SOLN injection Inject 0.4 mLs (200 mcg total) into the vein every Wednesday with hemodialysis. 06/22/13   Geradine Girt, DO  doxercalciferol (HECTOROL) 4 MCG/2ML injection Inject 0.5 mLs (1 mcg total) into the vein every Monday, Wednesday, and Friday with hemodialysis. 06/18/13   Geradine Girt, DO  HYDROcodone-acetaminophen (NORCO/VICODIN) 5-325 MG tablet Take 1-2 tablets by mouth every 4 (four) hours as needed for moderate pain. Patient not taking: Reported on 02/06/2018 11/26/17   [name], MD  [facility] VERIO test strip 3 (three) times daily. for testing 12/15/17   [provider]   Current Facility-Administered Medications  Medication Dose Route Frequency Provider Last Rate Last Dose  . acetaminophen (TYLENOL) tablet 500 mg  500 mg Oral BID Tawni Millers, MD   500 mg at 02/07/18 2137  . albuterol (PROVENTIL) (2.5 MG/3ML) 0.083% nebulizer solution 2.5 mg  2.5 mg Nebulization Q2H PRN Cherene Altes, MD      . amLODipine (NORVASC) tablet 10 mg  10 mg Oral QHS Tawni Millers, MD   10 mg at 02/07/18 2137  .  brimonidine (ALPHAGAN) 0.2 % ophthalmic solution 1 drop  1 drop Right Eye BID Arrien, Jimmy Picket, MD       And  . timolol (TIMOPTIC) 0.5 % ophthalmic solution 1 drop  1 drop Right Eye BID Arrien, Jimmy Picket, MD   1 drop at 02/08/18 1046  . carvedilol (COREG) tablet 12.5 mg  12.5 mg Oral BID WC Arrien, Jimmy Picket, MD   12.5 mg at 02/08/18 3790  . Chlorhexidine Gluconate Cloth 2 % PADS 6 each  6 each Topical Q0600 Penninger, [place], [place]      . diphenhydrAMINE (BENADRYL) 12.5 MG/5ML elixir 12.5 mg  12.5 mg Oral Q M,W,F Cherene Altes, MD   12.5 mg at 02/08/18 1044  . docusate sodium (COLACE) capsule 100 mg  100 mg Oral BID Tawni Millers, MD   100 mg at 02/08/18 1044  . fluticasone (FLONASE) 50 MCG/ACT nasal spray 1-2 spray  1-2 spray Each Nare Daily PRN Arrien, Jimmy Picket, MD      . gabapentin (NEURONTIN) capsule 100 mg  100 mg Oral BID Tawni Millers, MD   100 mg at 02/08/18 1044  . insulin aspart (novoLOG) injection 0-9 Units  0-9 Units Subcutaneous TID WC Arrien, Jimmy Picket, MD   1 Units at 02/08/18 [phone]  . lanthanum (FOSRENOL) chewable tablet 2,000 mg  2,000 mg Oral TID WC Arrien, Jimmy Picket, MD   2,000 mg at 02/08/18 7353  . lisinopril (PRINIVIL,ZESTRIL) tablet 40 mg  40 mg Oral QHS Arrien, Jimmy Picket, MD   40 mg at 02/07/18 2137  . multivitamin (RENA-VIT) tablet 1 tablet  1 tablet Oral QHS Cherene Altes, MD   1 tablet at 02/07/18 2137  . ondansetron (ZOFRAN) tablet 4 mg  4 mg Oral Q6H PRN Arrien, Jimmy Picket, MD       Or  . ondansetron [facility]) injection 4 mg  4 mg Intravenous Q6H PRN Arrien, Jimmy Picket, MD      . pantoprazole (PROTONIX) EC tablet 40 mg  40 mg Oral Q0600 [name], MD   40 mg at 02/08/18 0526  . prednisoLONE acetate (PRED FORTE) 1 % ophthalmic suspension 1 drop  1 drop Right Eye QID Arrien, Jimmy Picket, MD   1 drop at 02/08/18 1045   Labs: Basic Metabolic Panel: Recent Labs  Lab 02/06/18 1334  02/07/18  1146  NA 140 138  K 4.4 4.9  CL 97* 99  CO2 27 24  GLUCOSE 234* 197*  BUN 69* 99*  CREATININE 8.35* 10.14*  CALCIUM 8.6* 8.3*   CBC: Recent Labs  Lab 02/06/18 1334 02/07/18 1146 02/08/18 0754  WBC 8.8 16.0* 9.7  NEUTROABS 6.7  --   --   HGB 5.5* 8.6* 9.1*  HCT 17.7* 25.9* 27.3*  MCV 100.0 91.5 92.9  PLT 219 191 209   CBG: Recent Labs  Lab 02/07/18 0749 02/07/18 1214 02/07/18 1646 02/07/18 2103 02/08/18 0803  GLUCAP 146* 173* 181* 176* 124*    ROS: All others negative except those listed in HPI.    Physical Exam: Vitals:   02/07/18 1648 02/07/18 2057 02/08/18 0454 02/08/18 0805  BP: (!) 149/63 135/61 133/64 (!) 141/64  Pulse: 83 86 81 80  Resp: 20 16 20 18   Temp: 98.6 F (37 C) 98.9 F (37.2 C) 97.9 F (36.6 C) 98.5 F (36.9 C)  TempSrc: Oral Oral Oral Oral  SpO2: 95% 94% 98% 98%  Weight:  92.8 kg (204 lb 9.4 oz)       General: WDWN, NAD, blind female sitting on bedside Head: NCAT MMM Neck: Supple. No lymphadenopathy Lungs: CTA bilaterally. No wheeze, rales or rhonchi. Breathing is unlabored. Heart: RRR. 3/6 systolic murmur, rubs or gallops.  Abdomen: soft, nontender, +BS, no guarding, no rebound tenderness M/S:  Equal strength b/l in upper and lower extremities.  Lower extremities:no edema, ischemic changes, or open wounds  Neuro: AAOx3. Moves all extremities spontaneously. Psych:  Responds to questions appropriately with a normal affect. Dialysis Access:RU AVF +b/t  Dialysis Orders:  MWF - [place]  4hrs, BFR 400, DFR AF1.5,  EDW 85.5kg, 2K/ 2.25Ca  Access: RU AVF  Heparin None Mircera 200 mcg q2wks - last 7/17 Hectorol 4 mcg IV qHD     7/17 Hgb 9.4  Assessment/Plan: 1.  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  Hgb improved to 9.1 s/p 3Units pRBC on 7/21.  ESA recently dosed.  2.  ESRD -  MWF HD.  Orders written for HD today per regular schedule.  K 4.9.   3.  Hypertension  - BP elevated, expect improvement post HD.  On home meds.  4.  Volume - does not appear grossly volume overloaded on exam.  Need standing weights to better assess dry.  Net UF goal 3-4L as tolerated.   5.  Secondary Hyperparathyroidism -  Ca 8.3, out patient phos elevated.  Continue binders, VDRA. 6.  Nutrition - Renal diet with fluid restrictions.  7. DM - per primary  Jen Mow, PA-C [facility] Pager: [phone] 02/08/2018, 11:09 AM   Pt seen, examined and agree w A/P as above. 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  AVM was treated.  Hb up to 9.1 (for mid 5's) after prbc's x 2.  Plan HD today on sched.  Will follow.  Kelly Splinter MD [employer] pager [phone]   02/08/2018, 12:34 PM

## 2018-02-08 NOTE — Progress Notes (Signed)
Paged Dr. Thereasa Solo clarification of transfer patient.

## 2018-02-08 NOTE — Progress Notes (Signed)
[name]  YCX:[id number] DOB: 1950-10-09 DOA: 02/06/2018 PCP: Prince Solian, MD    Brief Narrative:  67 y.o. female with a hx of ESRD on hemodialysis (M-W-F), systolic heart failure, mitral regurgitation, pulmonary hypertension, DM2, HTN, and legal blindness who presented w/ a 2 week history of generalized weakness, decreased balance, easy fatigability, and lethargy.   She had a hospitalization in December 2018 with similar symptoms, and was diagnosed with an UGIB due to esophageal ulcers.  She received 7 units of packed red blood cells at that time.   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  Tolerated her diet w/o trouble this morning.  Denies cp, n/v, or abdom pain.  No sob.      Assessment & Plan:  Symptomatic anemia due to acute blood loss in setting of anemia of CKD Hgb was 12.23 Nov 2017 - s/p 3U PRBC this admit - transfuse as needed to keep Hgb > 6.9 - with this being her second major bleeding episode in last 7 months will monitor an additional night to assure Hgb remains stable   Recent Labs  Lab 02/06/18 1334 02/07/18 1146 02/08/18 0754  HGB 5.5* 8.6* 9.1*    Recurrent UGIB - duodenal AVM EGD 7/21 noted normal esophagus and stomach w/ bood in entire duodenum and a single bleeding AVM s/p APC w/ hemoclip x2 - follow to assure no further bleeding as discussed above - currently tolerating diet w/o difficulty    ESRD on HD Nephrology to arrange for HD today - anticipate D/C in AM 7/23 therefore will be able to resume usual outpt HD after today   HTN BP reasonably controlled   DM2 CBG reasonably controlled - cont to monitor    DVT prophylaxis: SCDs Code Status: FULL CODE Family Communication: no family present today  Disposition Plan: anticipate d/c home 7/23 if Hgb remains stable - PT/OT to see today   Consultants:  GI  Antimicrobials:  none  Objective: Blood pressure (!) 141/64,  pulse 80, temperature 98.5 F (36.9 C), temperature source Oral, resp. rate 18, weight 92.8 kg (204 lb 9.4 oz), SpO2 98 %.  Intake/Output Summary (Last 24 hours) at 02/08/2018 1109 Last data filed at 02/08/2018 0900 Gross per 24 hour  Intake 633.67 ml  Output 300 ml  Net 333.67 ml   Filed Weights   02/07/18 2057  Weight: 92.8 kg (204 lb 9.4 oz)    Examination: General: No acute respiratory distress - A&O Lungs: CTA B - no wheezing  Cardiovascular: RRR - 2/6 holosystolic M  Abdomen: NT/ND, soft, BS+, no mass  Extremities: No signi edema bilateral lower extremities  CBC: Recent Labs  Lab 02/06/18 1334 02/07/18 1146 02/08/18 0754  WBC 8.8 16.0* 9.7  NEUTROABS 6.7  --   --   HGB 5.5* 8.6* 9.1*  HCT 17.7* 25.9* 27.3*  MCV 100.0 91.5 92.9  PLT 219 191 497   Basic Metabolic Panel: Recent Labs  Lab 02/06/18 1334 02/07/18 1146  NA 140 138  K 4.4 4.9  CL 97* 99  CO2 27 24  GLUCOSE 234* 197*  BUN 69* 99*  CREATININE 8.35* 10.14*  CALCIUM 8.6* 8.3*   GFR: Estimated Creatinine Clearance: 6.2 mL/min (A) (by C-G formula based on SCr of 10.14 mg/dL (H)).  Liver Function Tests: No results for input(s): AST, ALT, ALKPHOS, BILITOT, PROT, ALBUMIN in the last 168 hours. No results for input(s): LIPASE, AMYLASE in the last  168 hours. No results for input(s): AMMONIA in the last 168 hours.  Coagulation Profile: Recent Labs  Lab 02/06/18 1612  INR 1.13    HbA1C: Hgb A1c MFr Bld  Date/Time Value Ref Range Status  11/24/2017 09:03 AM 5.9 (H) 4.8 - 5.6 % Final    Comment:    (NOTE) Pre diabetes:          5.7%-6.4% Diabetes:              >6.4% Glycemic control for   <7.0% adults with diabetes   06/05/2017 03:22 AM 7.3 (H) 4.8 - 5.6 % Final    Comment:    (NOTE) Pre diabetes:          5.7%-6.4% Diabetes:              >6.4% Glycemic control for   <7.0% adults with diabetes     CBG: Recent Labs  Lab 02/07/18 0749 02/07/18 1214 02/07/18 1646 02/07/18 2103  02/08/18 0803  GLUCAP 146* 173* 181* 176* 124*    Recent Results (from the past 240 hour(s))  MRSA PCR Screening     Status: None   Collection Time: 02/06/18 11:13 PM  Result Value Ref Range Status   MRSA by PCR NEGATIVE NEGATIVE Final    Comment:        The GeneXpert MRSA Assay (FDA approved for NASAL specimens only), is one component of a comprehensive MRSA colonization surveillance program. It is not intended to diagnose MRSA infection nor to guide or monitor treatment for MRSA infections. Performed at [facility], [place] [address] [place], [place] [address]      Scheduled Meds: . acetaminophen  500 mg Oral BID  . amLODipine  10 mg Oral QHS  . brimonidine  1 drop Right Eye BID   And  . timolol  1 drop Right Eye BID  . carvedilol  12.5 mg Oral BID WC  . Chlorhexidine Gluconate Cloth  6 each Topical Q0600  . diphenhydrAMINE  12.5 mg Oral Q M,W,F  . docusate sodium  100 mg Oral BID  . gabapentin  100 mg Oral BID  . insulin aspart  0-9 Units Subcutaneous TID WC  . lanthanum  2,000 mg Oral TID WC  . lisinopril  40 mg Oral QHS  . multivitamin  1 tablet Oral QHS  . pantoprazole  40 mg Oral Q0600  . prednisoLONE acetate  1 drop Right Eye QID     LOS: 2 days   Cherene Altes, MD [facility] Office  [phone] Pager - Text Page per Amion  If 7PM-7AM, please contact night-coverage per Amion 02/08/2018, 11:09 AM

## 2018-02-08 NOTE — Progress Notes (Addendum)
Daily Rounding Note  02/08/2018, 8:51 AM  LOS: 2 days   SUBJECTIVE:   Chief complaint:     Epigastric discomfort and weakness improved.  Tolerating renal diet.    OBJECTIVE:         Vital signs in last 24 hours:    Temp:  [97.9 F (36.6 C)-99 F (37.2 C)] 98.5 F (36.9 C) (07/22 0805) Pulse Rate:  [80-92] 80 (07/22 0805) Resp:  [12-20] 18 (07/22 0805) BP: (119-164)/(52-66) 141/64 (07/22 0805) SpO2:  [93 %-99 %] 98 % (07/22 0805) Weight:  [204 lb 9.4 oz (92.8 kg)] 204 lb 9.4 oz (92.8 kg) (07/21 2057) Last BM Date: 02/06/18 Filed Weights   02/07/18 2057  Weight: 204 lb 9.4 oz (92.8 kg)   General: comfortable, alert, not acutely ill looking   Heart: RRR with soft SM Chest: clear bil.  No cough or labored breathing Abdomen: soft, obese, NT.  BS hypoactive.    Extremities: no CCE Neuro/Psych:  Oriented x 3.  Blind.  Appropriate.  Moves all 4 limbs, no tremors.    Intake/Output from previous day: 07/21 0701 - 07/22 0700 In: 2081.7 [P.O.:600; I.V.:233.7; Blood:1248] Out: 400 [Urine:400]  Intake/Output this shift: No intake/output data recorded.  Lab Results: Recent Labs    02/06/18 1334 02/07/18 1146 02/08/18 0754  WBC 8.8 16.0* 9.7  HGB 5.5* 8.6* 9.1*  HCT 17.7* 25.9* 27.3*  PLT 219 191 209   BMET Recent Labs    02/06/18 1334 02/07/18 1146  NA 140 138  K 4.4 4.9  CL 97* 99  CO2 27 24  GLUCOSE 234* 197*  BUN 69* 99*  CREATININE 8.35* 10.14*  CALCIUM 8.6* 8.3*   LFT No results for input(s): PROT, ALBUMIN, AST, ALT, ALKPHOS, BILITOT, BILIDIR, IBILI in the last 72 hours. PT/INR Recent Labs    02/06/18 1612  LABPROT 14.4  INR 1.13   Scheduled Meds: . acetaminophen  500 mg Oral BID  . amLODipine  10 mg Oral QHS  . brimonidine  1 drop Right Eye BID   And  . timolol  1 drop Right Eye BID  . carvedilol  12.5 mg Oral BID WC  . diphenhydrAMINE  12.5 mg Oral Q M,W,F  . docusate sodium  100 mg  Oral BID  . gabapentin  100 mg Oral BID  . insulin aspart  0-9 Units Subcutaneous TID WC  . lanthanum  2,000 mg Oral TID WC  . lisinopril  40 mg Oral QHS  . multivitamin  1 tablet Oral QHS  . pantoprazole  40 mg Oral Q0600  . prednisoLONE acetate  1 drop Right Eye QID   Continuous Infusions: PRN Meds:.albuterol, fluticasone, ondansetron **OR** ondansetron (ZOFRAN) IV  ASSESMENT:   *  Acute on chronic anemia (Hgb 13.6 >> 5.5 over 10 weeks), FOBT +.  GIB 06/2017: EGD with clipping of esophageal ulcer/MWT.  On Omeprazole 40mg  BID at home.  Receives weekly Aranesp  02/07/18 EGD with blood in duodenum, bleeding AVM at D2 was obliterated with ERBE laser and 2 clips applied at application site.  Esophagus normal.   S/p PRBCs x 3. Not iron, folate, B12 deficient.    On Protonix 40 mg po qday.     *   ESRD, on HD MWF.    *  Hx stable liver lesion, hepatomegaly, etiology not clear, Dr Carlean Purl has been following.     PLAN   *  At discharge can drop dose Omeprazole  to 40 mg q day.    *  Colonoscopy as outpatient, has never had one.  Will arrange GI OV for nondialysis day.     [name]  02/08/2018, 8:51 AM Phone [phone]    Attending physician's note   I have taken an interval history, reviewed the chart and examined the patient. I agree with the Advanced Practitioner's note, impression and recommendations. Duodenal AVM with acute bleed treated with thermal therapy and clips. ABL on chronic anemia. No recurrent bleeding and Hb=9.1 today. If she remains stable ok for discharge tomorrow from GI standpoint. GI office follow up with Dr. Carlean Purl to consider elective colonoscopy. GI signing off.   Lucio Edward, MD FACG [phone] office

## 2018-02-08 NOTE — Procedures (Signed)
   I was present at this dialysis session, have reviewed the session itself and made  appropriate changes Kelly Splinter MD [place] pager [phone]   02/08/2018, 5:37 PM

## 2018-02-08 NOTE — Progress Notes (Signed)
Dr. Billee Cashing Clung clarification, pt to stay on floor, dc tele.

## 2018-02-09 LAB — CBC
HCT: 26.3 % — ABNORMAL LOW (ref 36.0–46.0)
Hemoglobin: 8.6 g/dL — ABNORMAL LOW (ref 12.0–15.0)
MCH: 31 pg (ref 26.0–34.0)
MCHC: 32.7 g/dL (ref 30.0–36.0)
MCV: 94.9 fL (ref 78.0–100.0)
Platelets: 204 10*3/uL (ref 150–400)
RBC: 2.77 MIL/uL — AB (ref 3.87–5.11)
RDW: 16.5 % — ABNORMAL HIGH (ref 11.5–15.5)
WBC: 8.6 10*3/uL (ref 4.0–10.5)

## 2018-02-09 LAB — GLUCOSE, CAPILLARY
GLUCOSE-CAPILLARY: 106 mg/dL — AB (ref 70–99)
Glucose-Capillary: 216 mg/dL — ABNORMAL HIGH (ref 70–99)

## 2018-02-09 MED ORDER — OMEPRAZOLE 40 MG PO CPDR: 40.0000 mg | DELAYED_RELEASE_CAPSULE | Freq: Every day | ORAL | Status: DC

## 2018-02-09 NOTE — Progress Notes (Addendum)
[facility] Progress Note   Subjective:   Feeling better today. Denies CP, SOB, dizziness and weakness. Hoping to go home.   Objective Vitals:   02/08/18 1807 02/08/18 1945 02/09/18 0555 02/09/18 0900  BP: 140/62 124/65 (!) 150/69 (!) 148/67  Pulse: 82 83 76 88  Resp: 16  16 18   Temp: 98.5 F (36.9 C) 98.5 F (36.9 C) 98.3 F (36.8 C) 98.2 F (36.8 C)  TempSrc: Oral Oral Oral Oral  SpO2: 95% 96% 93% 98%  Weight:   91.1 kg (200 lb 13.4 oz)    Physical Exam General:NAD, chronically ill appearing blind female Heart:RRR, +6/3 systolic murmur Lungs:CTAB Abdomen:soft, NTND Extremities:no LE edema Dialysis Access: RU AVF +b/t   Filed Weights   02/08/18 1340 02/08/18 1710 02/09/18 0555  Weight: 90.9 kg (200 lb 6.4 oz) 89 kg (196 lb 3.4 oz) 91.1 kg (200 lb 13.4 oz)    Intake/Output Summary (Last 24 hours) at 02/09/2018 1138 Last data filed at 02/09/2018 1000 Gross per 24 hour  Intake 600 ml  Output 1766 ml  Net -1166 ml    Additional Objective Labs: Basic Metabolic Panel: Recent Labs  Lab 02/06/18 1334 02/07/18 1146 02/08/18 1400  NA 140 138 135  K 4.4 4.9 5.0  CL 97* 99 97*  CO2 27 24 20*  GLUCOSE 234* 197* 179*  BUN 69* 99* 120*  CREATININE 8.35* 10.14* 12.30*  CALCIUM 8.6* 8.3* 8.6*  PHOS  --   --  6.5*   Liver Function Tests: Recent Labs  Lab 02/08/18 1400  ALBUMIN 2.9*   CBC: Recent Labs  Lab 02/06/18 1334 02/07/18 1146 02/08/18 0754 02/09/18 0548  WBC 8.8 16.0* 9.7 8.6  NEUTROABS 6.7  --   --   --   HGB 5.5* 8.6* 9.1* 8.6*  HCT 17.7* 25.9* 27.3* 26.3*  MCV 100.0 91.5 92.9 94.9  PLT 219 191 209 204    CBG: Recent Labs  Lab 02/08/18 0803 02/08/18 1152 02/08/18 1805 02/08/18 2102 02/09/18 0734  GLUCAP 124* 174* 119* 214* 106*    Lab Results  Component Value Date   INR 1.13 02/06/2018   INR 1.03 06/17/2013   INR 1.02 04/21/2013   Studies/Results: No results found.  Medications:  . acetaminophen  500 mg Oral  BID  . amLODipine  10 mg Oral QHS  . brimonidine  1 drop Right Eye BID   And  . timolol  1 drop Right Eye BID  . carvedilol  12.5 mg Oral BID WC  . Chlorhexidine Gluconate Cloth  6 each Topical Q0600  . diphenhydrAMINE  12.5 mg Oral Q M,W,F  . docusate sodium  100 mg Oral BID  . [START ON 02/10/2018] doxercalciferol  4 mcg Intravenous Q M,W,F-HD  . gabapentin  100 mg Oral BID  . insulin aspart  0-9 Units Subcutaneous TID WC  . lanthanum  2,000 mg Oral TID WC  . lisinopril  40 mg Oral QHS  . multivitamin  1 tablet Oral QHS  . pantoprazole  40 mg Oral Q0600  . prednisoLONE acetate  1 drop Right Eye QID    Dialysis Orders: MWF - [place]  4hrs, BFR 400, DFR AF1.5,  EDW 85.5kg, 2K/ 2.25Ca  Access: RU AVF  Heparin None Mircera 200 mcg q2wks - last 7/17 Hectorol 4 mcg IV qHD     7/17 Hgb 9.   Assessment/Plan: 1.  Acute on Chronic anemia of CKD - EGD showed single bleeding angiectasia in the duodenum treated with APC  and clip.  Hgb improved to 9.1>8.6 s/p 3Units pRBC on 7/21.  ESA recently dosed. Follow trends. 2.  ESRD -  MWF HD. Last K 5.0. Will continue HD per regular schedule.  3.  Hypertension  - BP elevated, slight improvement post HD despite minimal volume removal. On home meds, hold prior to HD to avoid hypotension. 4.  Volume - does not appear grossly volume overloaded on exam.  Not at EDW, post HD weight 91kg. Minimal volume removal w/HD yesterday due to hypotension.   Continue to titrate down as tolerated.  5.  Secondary Hyperparathyroidism -  Ca 8.6, Phos 6.5. Continue binders, VDRA. 6.  Nutrition - Alb 2.9. Renal diet with fluid restrictions. Needs protein supplement.  7. DM - per primary  Jen Mow, PA-C [facility] Pager: [phone] 02/09/2018,11:38 AM  LOS: 3 days   Pt seen, examined and agree w A/P as above. May need to [facility] some.  Stable for dc from renal standpoint.  Kelly Splinter MD [employer] pager [phone]    02/09/2018, 12:33 PM

## 2018-02-09 NOTE — Discharge Instructions (Signed)
Gastrointestinal Arteriovenous Malformations °A gastrointestinal arteriovenous malformation is a rare defect of tangled veins and arteries involving the intestine. The defect can occur anywhere in the intestine. The defect creates an abnormal collection of blood vessels that can put pressure on other parts of your body that are close by. It causes blood vessels to expand (dilate) over time and sometimes bleed. Many people with this defect never have any symptoms. This defect is present at birth (congenital). °What are the signs or symptoms? °Signs and symptoms often do not appear until you are older and may include: °· Blood in your stool. °· Black or dark red stools. °· Weakness. °· Tiredness. °· Shortness of breath. °· Iron deficiency anemia. ° °How is this diagnosed? °The following imaging exams may be used to help in diagnosis: °· X-ray. °· CT scan. °· Angiogram. °· Endoscopy. °· Enteroscopy. °· Capsule endoscopy. ° °How is this treated? °The goal of treatment is to deal with blood loss and prevent future bleeding. Possible treatments include: °· Minimally invasive procedures to stop the bleeding or destroy the affected vessels. °· Abdominal surgery to remove the affected section of the digestive system. °· Blood transfusion to replace significant blood loss. °· Iron supplementation for iron deficiency anemia. ° °Follow these instructions at home: °· Take all medicines as directed by your health care provider. °· Follow up with your health care provider as directed. °Contact a health care provider if: °· You notice chronic streaks of blood in your stool. °· You have black or deep red stool. °· You have signs of anemia such as weakness, fatigue, or shortness of breath. °· You notice other new symptoms. °Get help right away if: °· You have heavy bleeding during a bowel movement. °This information is not intended to replace advice given to you by your health care provider. Make sure you discuss any questions you  have with your health care provider. °Document Released: 07/12/2013 Document Revised: 12/13/2015 Document Reviewed: 06/09/2013 °Elsevier Interactive Patient Education © 2017 Elsevier Inc. ° °

## 2018-02-09 NOTE — Anesthesia Postprocedure Evaluation (Signed)
Anesthesia Post Note  Patient: [name]  Procedure(s) Performed: ESOPHAGOGASTRODUODENOSCOPY (EGD) WITH PROPOFOL (N/A )     Patient location during evaluation: Endoscopy Anesthesia Type: MAC Level of consciousness: awake and alert Pain management: pain level controlled Vital Signs Assessment: post-procedure vital signs reviewed and stable Respiratory status: spontaneous breathing, nonlabored ventilation, respiratory function stable and patient connected to nasal cannula oxygen Cardiovascular status: blood pressure returned to baseline and stable Postop Assessment: no apparent nausea or vomiting Anesthetic complications: no    Last Vitals:  Vitals:   02/09/18 0555 02/09/18 0900  BP: (!) 150/69 (!) 148/67  Pulse: 76 88  Resp: 16 18  Temp: 36.8 C 36.8 C  SpO2: 93% 98%    Last Pain:  Vitals:   02/09/18 0900  TempSrc: Oral  PainSc: 0-No pain                 Sallie Staron S

## 2018-02-09 NOTE — Progress Notes (Signed)
Patient Discharge: Disposition: Patient discharged to home. Education: Reviewed medications, prescriptions, follow-up appointment and discharge instructions with patient and family, verbalized understanding. IV: Discontinued Iv before discharge. Transportation: Patient escorted out of the unit in w/c. Belongings: Patient's family took all her belongings with her.

## 2018-02-09 NOTE — Care Management Important Message (Signed)
Important Message  Patient Details  Name: [name] MRN: [id number] Date of Birth: 1950/12/27   Medicare Important Message Given:  Yes    Orbie Pyo 02/09/2018, 2:36 PM

## 2018-02-09 NOTE — Progress Notes (Signed)
DISCHARGE SUMMARY  [name]  MR#: [id number]  DOB:[birth year]  Date of Admission: 02/06/2018 Date of Discharge: 02/09/2018  Attending Physician:Dock Baccam Hennie Duos, MD  Patient's IEP:PIRJ, Steva Ready, MD  Consults: [facility]  Nephrology   Disposition: D/C home   Follow-up Appts: Follow-up Information    Willia Craze, NP Follow up on 03/11/2018.   Specialty:  Gastroenterology Why:  11 AM.  visit with GI, Dr Marla Roe nurse practitioner.   Contact information: [place] [address] [phone]        Prince Solian, MD. Schedule an appointment as soon as possible for a visit in 1 week(s).   Specialty:  Internal Medicine Contact information: [place] [address] [phone]           Tests Needing Follow-up: -assessment of Hgb should be carried out in 5-7 days   Discharge Diagnoses: Symptomatic anemia due to acute blood loss in setting of anemia of CKD Recurrent UGIB - duodenal AVM ESRD on HD HTN DM2   Initial presentation: 67 y.o.femalewith a hx of ESRD on hemodialysis(M-W-F),systolic heart failure, mitral regurgitation, pulmonary hypertension, DM2, HTN, and legal blindness who presented w/ a 2 week history of generalized weakness, decreased balance,easy fatigability, and lethargy.   She had a hospitalization in December 2018 with similar symptoms, and was diagnosed with an UGIB due to esophageal ulcers.  She received 7 units of packed red blood cells at that time.  Hospital Course:  Symptomatic anemia due to acute blood loss in setting of anemia of CKD Hgb was 12.23 Nov 2017 - s/p 3U PRBC this admit - Hgb remained stable after her EGD/APC tx and no further transfusions were required   Recurrent UGIB - duodenal AVM EGD 7/21 noted normal esophagus and stomach w/ bood in entire duodenum and a single bleeding AVM s/p APC w/ hemoclip x2 - no further bleeding encountered post EGD - tolerating diet w/o  difficulty at time of d/c    ESRD on HD Nephrology attended to HD during inpt stay w/ pt remaining    HTN BP reasonably controlled   DM2 CBG reasonably controlled   Allergies as of 02/09/2018      Reactions   Latex Hives   Oxycodone Other (See Comments)   Hallucinations    Tramadol Other (See Comments)   hallucinations    Tape Rash   66M Transpore adhesive tape. Medical tape   Vicodin [hydrocodone-acetaminophen] Other (See Comments)   hallucinations      Medication List    STOP taking these medications   HYDROcodone-acetaminophen 5-325 MG tablet Commonly known as:  NORCO/VICODIN     TAKE these medications   acetaminophen 500 MG tablet Commonly known as:  TYLENOL Take 500 mg by mouth 2 (two) times daily.   albuterol (2.5 MG/66ML) 0.083% nebulizer solution Commonly known as:  PROVENTIL Take 3 mLs (2.5 mg total) by nebulization every 6 (six) hours as needed for wheezing or shortness of breath.   amLODipine 10 MG tablet Commonly known as:  NORVASC Take 10 mg at bedtime by mouth.   aspirin 81 MG chewable tablet Chew 81 mg by mouth daily with lunch.   atropine 1 % ophthalmic solution Place 1 drop into the right eye 2 (two) times daily.   carvedilol 12.5 MG tablet Commonly known as:  COREG Take 12.5 mg by mouth 2 (two) times daily with a meal.   cloNIDine 0.2 MG tablet Commonly known as:  CATAPRES Take 0.2 mg by mouth 2 (  two) times daily. with lunch and at bedtime   COMBIGAN 0.2-0.5 % ophthalmic solution Generic drug:  brimonidine-timolol Place 1 drop into the right eye every 12 (twelve) hours.   darbepoetin 200 MCG/0.4ML Soln injection Commonly known as:  ARANESP Inject 0.4 mLs (200 mcg total) into the vein every Wednesday with hemodialysis.   diphenhydrAMINE 25 MG tablet Commonly known as:  BENADRYL Take 12.5 mg by mouth every Monday, Wednesday, and Friday.   docusate sodium 100 MG capsule Commonly known as:  COLACE Take 100 mg by mouth 2 (two) times  daily.   doxercalciferol 4 MCG/2ML injection Commonly known as:  HECTOROL Inject 0.5 mLs (1 mcg total) into the vein every Monday, Wednesday, and Friday with hemodialysis.   fluticasone 50 MCG/ACT nasal spray Commonly known as:  FLONASE Place 1-2 sprays into both nostrils daily as needed for allergies or rhinitis.   gabapentin 100 MG capsule Commonly known as:  NEURONTIN Take 100 mg by mouth 2 (two) times daily.   lanthanum 1000 MG chewable tablet Commonly known as:  FOSRENOL Chew 2,000 mg by mouth 3 (three) times daily with meals. Take 1 tablet with snack   lisinopril 40 MG tablet Commonly known as:  PRINIVIL,ZESTRIL Take 40 mg by mouth at bedtime.   NOVOLOG FLEXPEN 100 UNIT/ML FlexPen Generic drug:  insulin aspart Inject 10-16 Units into the skin See admin instructions. Three times daily per Sliding scale   omeprazole 40 MG capsule Commonly known as:  PRILOSEC Take 1 capsule (40 mg total) by mouth daily. What changed:  when to take this   [facility] VERIO test strip Generic drug:  glucose blood 3 (three) times daily. for testing   prednisoLONE acetate 1 % ophthalmic suspension Commonly known as:  PRED FORTE Place 1 drop into the right eye 2 (two) times daily.   RENA-VITE RX 1 MG Tabs Take 1 tablet by mouth daily with lunch.       Day of Discharge BP (!) 148/67 (BP Location: Left Arm) Comment: Reported to RN  Pulse 88   Temp 98.2 F (36.8 C) (Oral)   Resp 18   Wt 91.1 kg (200 lb 13.4 oz)   SpO2 98%   BMI 33.42 kg/m   Physical Exam: General: No acute respiratory distress Lungs: Clear to auscultation bilaterally without wheezes or crackles Cardiovascular: Regular rate and rhythm - 2/6 holosystolic M  Abdomen: Nontender, nondistended, soft, bowel sounds positive, no rebound, no ascites, no appreciable mass Extremities: No significant cyanosis, clubbing, or edema bilateral lower extremities  Basic Metabolic Panel: Recent Labs  Lab 02/06/18 1334  02/07/18 1146 02/08/18 1400  NA 140 138 135  K 4.4 4.9 5.0  CL 97* 99 97*  CO2 27 24 20*  GLUCOSE 234* 197* 179*  BUN 69* 99* 120*  CREATININE 8.35* 10.14* 12.30*  CALCIUM 8.6* 8.3* 8.6*  PHOS  --   --  6.5*    Liver Function Tests: Recent Labs  Lab 02/08/18 1400  ALBUMIN 2.9*   Coags: Recent Labs  Lab 02/06/18 1612  INR 1.13   CBC: Recent Labs  Lab 02/06/18 1334 02/07/18 1146 02/08/18 0754 02/09/18 0548  WBC 8.8 16.0* 9.7 8.6  NEUTROABS 6.7  --   --   --   HGB 5.5* 8.6* 9.1* 8.6*  HCT 17.7* 25.9* 27.3* 26.3*  MCV 100.0 91.5 92.9 94.9  PLT 219 191 209 204    CBG: Recent Labs  Lab 02/08/18 0803 02/08/18 1152 02/08/18 1805 02/08/18 2102 02/09/18 0734  GLUCAP  124* 174* 119* 214* 106*    Recent Results (from the past 240 hour(s))  MRSA PCR Screening     Status: None   Collection Time: 02/06/18 11:13 PM  Result Value Ref Range Status   MRSA by PCR NEGATIVE NEGATIVE Final    Comment:        The GeneXpert MRSA Assay (FDA approved for NASAL specimens only), is one component of a comprehensive MRSA colonization surveillance program. It is not intended to diagnose MRSA infection nor to guide or monitor treatment for MRSA infections. Performed at [facility], [place] [address] [place], [place] [address]      Time spent in discharge (includes decision making & examination of pt): 35 minutes  02/09/2018, 11:12 AM   Cherene Altes, MD [facility] Office  [phone] Pager [phone]  On-Call/Text Page:      [email or web address]      password [facility]

## 2018-02-09 NOTE — Progress Notes (Signed)
   02/09/18  To Whom it may concern,  [name] was admitted to [facility] on 02/06/18 and remained under my care in the hospital through 02/09/18.  Her family was involved in her care during her hospital stay.  Sincerely,  Cherene Altes, MD [facility] Office  [phone]

## 2018-02-09 NOTE — Discharge Summary (Signed)
DISCHARGE SUMMARY  ASMARA BACKS  MR#: [id number]  DOB:Jun 27, 1951  Date of Admission: 02/06/2018 Date of Discharge: 02/09/2018  Attending Physician:Zayana Salvador Hennie Duos, MD  Patient's GEX:BMWU, Steva Ready, MD  Consults: [facility]  Nephrology   Disposition: D/C home   Follow-up Appts:    Follow-up Information    Willia Craze, NP Follow up on 03/11/2018.   Specialty:  Gastroenterology Why:  11 AM.  visit with GI, Dr Marla Roe nurse practitioner.   Contact information: [place] [address] [phone]        Prince Solian, MD. Schedule an appointment as soon as possible for a visit in 1 week(s).   Specialty:  Internal Medicine Contact information: [place]  [address] [phone]           Tests Needing Follow-up: -assessment of Hgb should be carried out in 5-7 days   Discharge Diagnoses: Symptomatic anemia due to acute blood loss in setting of anemia of CKD Recurrent UGIB - duodenal AVM ESRD on HD HTN DM2  Initial presentation: 67 y.o.femalewith a hx of ESRD on hemodialysis(M-W-F),systolic heart failure, mitral regurgitation, pulmonary hypertension, DM2, HTN, and legal blindness who presented w/ a 2 week history of generalized weakness, decreased balance,easy fatigability, and lethargy.   She had a hospitalization in December 2018 with similar symptoms, and was diagnosed with an UGIB due to esophageal ulcers. She received 7 units of packed red blood cells at that time.  Hospital Course:  Symptomatic anemia due to acute blood loss in setting of anemia of CKD Hgb was 12.23 Nov 2017 - s/p 3U PRBCthis admit- Hgb remained stable after her EGD/APC tx and no further transfusions were required   Recurrent UGIB - duodenal AVM EGD 7/21 noted normal esophagus and stomach w/ bood in entire duodenum and a single bleeding AVM s/p APC w/ hemoclip x2 - no further bleedingencountered post  EGD - tolerating diet w/o difficultyat time of d/c   ESRD on HD Nephrologyattended to HD during inpt stay w/ pt remaining   HTN BP reasonably controlled  DM2 CBG reasonably controlled        Allergies as of 02/09/2018      Reactions   Latex Hives   Oxycodone Other (See Comments)   Hallucinations    Tramadol Other (See Comments)   hallucinations    Tape Rash   69M Transpore adhesive tape. Medical tape   Vicodin [hydrocodone-acetaminophen] Other (See Comments)   hallucinations         Medication List    STOP taking these medications   HYDROcodone-acetaminophen 5-325 MG tablet Commonly known as:  NORCO/VICODIN     TAKE these medications   acetaminophen 500 MG tablet Commonly known as:  TYLENOL Take 500 mg by mouth 2 (two) times daily.   albuterol (2.5 MG/69ML) 0.083% nebulizer solution Commonly known as:  PROVENTIL Take 3 mLs (2.5 mg total) by nebulization every 6 (six) hours as needed for wheezing or shortness of breath.   amLODipine 10 MG tablet Commonly known as:  NORVASC Take 10 mg at bedtime by mouth.   aspirin 81 MG chewable tablet Chew 81 mg by mouth daily with lunch.   atropine 1 % ophthalmic solution Place 1 drop into the right eye 2 (two) times daily.   carvedilol 12.5 MG tablet Commonly known as:  COREG Take 12.5 mg by mouth 2 (two) times daily with a meal.   cloNIDine 0.2 MG tablet Commonly known as:  CATAPRES Take 0.2 mg by  mouth 2 (two) times daily. with lunch and at bedtime   COMBIGAN 0.2-0.5 % ophthalmic solution Generic drug:  brimonidine-timolol Place 1 drop into the right eye every 12 (twelve) hours.   darbepoetin 200 MCG/0.4ML Soln injection Commonly known as:  ARANESP Inject 0.4 mLs (200 mcg total) into the vein every Wednesday with hemodialysis.   diphenhydrAMINE 25 MG tablet Commonly known as:  BENADRYL Take 12.5 mg by mouth every Monday, Wednesday, and Friday.   docusate sodium 100 MG  capsule Commonly known as:  COLACE Take 100 mg by mouth 2 (two) times daily.   doxercalciferol 4 MCG/2ML injection Commonly known as:  HECTOROL Inject 0.5 mLs (1 mcg total) into the vein every Monday, Wednesday, and Friday with hemodialysis.   fluticasone 50 MCG/ACT nasal spray Commonly known as:  FLONASE Place 1-2 sprays into both nostrils daily as needed for allergies or rhinitis.   gabapentin 100 MG capsule Commonly known as:  NEURONTIN Take 100 mg by mouth 2 (two) times daily.   lanthanum 1000 MG chewable tablet Commonly known as:  FOSRENOL Chew 2,000 mg by mouth 3 (three) times daily with meals. Take 1 tablet with snack   lisinopril 40 MG tablet Commonly known as:  PRINIVIL,ZESTRIL Take 40 mg by mouth at bedtime.   NOVOLOG FLEXPEN 100 UNIT/ML FlexPen Generic drug:  insulin aspart Inject 10-16 Units into the skin See admin instructions. Three times daily per Sliding scale   omeprazole 40 MG capsule Commonly known as:  PRILOSEC Take 1 capsule (40 mg total) by mouth daily. What changed:  when to take this   [facility] VERIO test strip Generic drug:  glucose blood 3 (three) times daily. for testing   prednisoLONE acetate 1 % ophthalmic suspension Commonly known as:  PRED FORTE Place 1 drop into the right eye 2 (two) times daily.   RENA-VITE RX 1 MG Tabs Take 1 tablet by mouth daily with lunch.       Day of Discharge BP (!) 148/67 (BP Location: Left Arm) Comment: Reported to RN  Pulse 88   Temp 98.2 F (36.8 C) (Oral)   Resp 18   Wt 91.1 kg (200 lb 13.4 oz)   SpO2 98%   BMI 33.42 kg/m   Physical Exam: General: No acute respiratory distress Lungs: Clear to auscultation bilaterally without wheezes or crackles Cardiovascular: Regular rate and rhythm - 2/6 holosystolic M  Abdomen: Nontender, nondistended, soft, bowel sounds positive, no rebound, no ascites, no appreciable mass Extremities: No significant cyanosis, clubbing, or edema bilateral  lower extremities  Basic Metabolic Panel: LastLabs       Recent Labs  Lab 02/06/18 1334 02/07/18 1146 02/08/18 1400  NA 140 138 135  K 4.4 4.9 5.0  CL 97* 99 97*  CO2 27 24 20*  GLUCOSE 234* 197* 179*  BUN 69* 99* 120*  CREATININE 8.35* 10.14* 12.30*  CALCIUM 8.6* 8.3* 8.6*  PHOS  --   --  6.5*      Liver Function Tests: LastLabs     Recent Labs  Lab 02/08/18 1400  ALBUMIN 2.9*     Coags: LastLabs  Recent Labs  Lab 02/06/18 1612  INR 1.13     CBC: LastLabs        Recent Labs  Lab 02/06/18 1334 02/07/18 1146 02/08/18 0754 02/09/18 0548  WBC 8.8 16.0* 9.7 8.6  NEUTROABS 6.7  --   --   --   HGB 5.5* 8.6* 9.1* 8.6*  HCT 17.7* 25.9* 27.3* 26.3*  MCV  100.0 91.5 92.9 94.9  PLT 219 191 209 204      CBG: LastLabs         Recent Labs  Lab 02/08/18 0803 02/08/18 1152 02/08/18 1805 02/08/18 2102 02/09/18 0734  GLUCAP 124* 174* 119* 214* 106*             Recent Results (from the past 240 hour(s))  MRSA PCR Screening     Status: None   Collection Time: 02/06/18 11:13 PM  Result Value Ref Range Status   MRSA by PCR NEGATIVE NEGATIVE Final    Comment:        The GeneXpert MRSA Assay (FDA approved for NASAL specimens only), is one component of a comprehensive MRSA colonization surveillance program. It is not intended to diagnose MRSA infection nor to guide or monitor treatment for MRSA infections. Performed at [facility], [place] [address] [place], [place] [address]      Time spent in discharge (includes decision making & examination of pt): 35 minutes  02/09/2018, 11:12 AM   Cherene Altes, MD [facility] Office  [phone] Pager [phone]  On-Call/Text Page:      [email or web address]      password [facility]

## 2018-02-10 DIAGNOSIS — Z992 Dependence on renal dialysis: Secondary | ICD-10-CM | POA: Diagnosis not present

## 2018-02-10 DIAGNOSIS — E1142 Type 2 diabetes mellitus with diabetic polyneuropathy: Secondary | ICD-10-CM | POA: Diagnosis not present

## 2018-02-10 DIAGNOSIS — D631 Anemia in chronic kidney disease: Secondary | ICD-10-CM | POA: Diagnosis not present

## 2018-02-10 DIAGNOSIS — N2581 Secondary hyperparathyroidism of renal origin: Secondary | ICD-10-CM | POA: Diagnosis not present

## 2018-02-10 DIAGNOSIS — E1129 Type 2 diabetes mellitus with other diabetic kidney complication: Secondary | ICD-10-CM | POA: Diagnosis not present

## 2018-02-10 DIAGNOSIS — D509 Iron deficiency anemia, unspecified: Secondary | ICD-10-CM | POA: Diagnosis not present

## 2018-02-10 DIAGNOSIS — N186 End stage renal disease: Secondary | ICD-10-CM | POA: Diagnosis not present

## 2018-02-12 DIAGNOSIS — N186 End stage renal disease: Secondary | ICD-10-CM | POA: Diagnosis not present

## 2018-02-12 DIAGNOSIS — N2581 Secondary hyperparathyroidism of renal origin: Secondary | ICD-10-CM | POA: Diagnosis not present

## 2018-02-12 DIAGNOSIS — D631 Anemia in chronic kidney disease: Secondary | ICD-10-CM | POA: Diagnosis not present

## 2018-02-12 DIAGNOSIS — D509 Iron deficiency anemia, unspecified: Secondary | ICD-10-CM | POA: Diagnosis not present

## 2018-02-12 DIAGNOSIS — E1129 Type 2 diabetes mellitus with other diabetic kidney complication: Secondary | ICD-10-CM | POA: Diagnosis not present

## 2018-02-12 DIAGNOSIS — Z992 Dependence on renal dialysis: Secondary | ICD-10-CM | POA: Diagnosis not present

## 2018-02-15 DIAGNOSIS — D631 Anemia in chronic kidney disease: Secondary | ICD-10-CM | POA: Diagnosis not present

## 2018-02-15 DIAGNOSIS — N186 End stage renal disease: Secondary | ICD-10-CM | POA: Diagnosis not present

## 2018-02-15 DIAGNOSIS — N2581 Secondary hyperparathyroidism of renal origin: Secondary | ICD-10-CM | POA: Diagnosis not present

## 2018-02-15 DIAGNOSIS — Z992 Dependence on renal dialysis: Secondary | ICD-10-CM | POA: Diagnosis not present

## 2018-02-15 DIAGNOSIS — E1129 Type 2 diabetes mellitus with other diabetic kidney complication: Secondary | ICD-10-CM | POA: Diagnosis not present

## 2018-02-15 DIAGNOSIS — D509 Iron deficiency anemia, unspecified: Secondary | ICD-10-CM | POA: Diagnosis not present

## 2018-02-17 DIAGNOSIS — D631 Anemia in chronic kidney disease: Secondary | ICD-10-CM | POA: Diagnosis not present

## 2018-02-17 DIAGNOSIS — N2581 Secondary hyperparathyroidism of renal origin: Secondary | ICD-10-CM | POA: Diagnosis not present

## 2018-02-17 DIAGNOSIS — D509 Iron deficiency anemia, unspecified: Secondary | ICD-10-CM | POA: Diagnosis not present

## 2018-02-17 DIAGNOSIS — Z992 Dependence on renal dialysis: Secondary | ICD-10-CM | POA: Diagnosis not present

## 2018-02-17 DIAGNOSIS — E1129 Type 2 diabetes mellitus with other diabetic kidney complication: Secondary | ICD-10-CM | POA: Diagnosis not present

## 2018-02-17 DIAGNOSIS — N186 End stage renal disease: Secondary | ICD-10-CM | POA: Diagnosis not present

## 2018-02-18 DIAGNOSIS — E1129 Type 2 diabetes mellitus with other diabetic kidney complication: Secondary | ICD-10-CM | POA: Diagnosis not present

## 2018-02-18 DIAGNOSIS — N186 End stage renal disease: Secondary | ICD-10-CM | POA: Diagnosis not present

## 2018-02-18 DIAGNOSIS — Z794 Long term (current) use of insulin: Secondary | ICD-10-CM | POA: Diagnosis not present

## 2018-02-18 DIAGNOSIS — Z6832 Body mass index (BMI) 32.0-32.9, adult: Secondary | ICD-10-CM | POA: Diagnosis not present

## 2018-02-18 DIAGNOSIS — I10 Essential (primary) hypertension: Secondary | ICD-10-CM | POA: Diagnosis not present

## 2018-02-18 DIAGNOSIS — Z992 Dependence on renal dialysis: Secondary | ICD-10-CM | POA: Diagnosis not present

## 2018-02-18 DIAGNOSIS — I509 Heart failure, unspecified: Secondary | ICD-10-CM | POA: Diagnosis not present

## 2018-02-18 DIAGNOSIS — E7849 Other hyperlipidemia: Secondary | ICD-10-CM | POA: Diagnosis not present

## 2018-02-18 DIAGNOSIS — I872 Venous insufficiency (chronic) (peripheral): Secondary | ICD-10-CM | POA: Diagnosis not present

## 2018-02-18 DIAGNOSIS — K922 Gastrointestinal hemorrhage, unspecified: Secondary | ICD-10-CM | POA: Diagnosis not present

## 2018-02-18 DIAGNOSIS — D508 Other iron deficiency anemias: Secondary | ICD-10-CM | POA: Diagnosis not present

## 2018-02-19 DIAGNOSIS — E1129 Type 2 diabetes mellitus with other diabetic kidney complication: Secondary | ICD-10-CM | POA: Diagnosis not present

## 2018-02-19 DIAGNOSIS — N2581 Secondary hyperparathyroidism of renal origin: Secondary | ICD-10-CM | POA: Diagnosis not present

## 2018-02-19 DIAGNOSIS — N186 End stage renal disease: Secondary | ICD-10-CM | POA: Diagnosis not present

## 2018-02-19 DIAGNOSIS — D509 Iron deficiency anemia, unspecified: Secondary | ICD-10-CM | POA: Diagnosis not present

## 2018-02-19 DIAGNOSIS — Z992 Dependence on renal dialysis: Secondary | ICD-10-CM | POA: Diagnosis not present

## 2018-02-19 DIAGNOSIS — D631 Anemia in chronic kidney disease: Secondary | ICD-10-CM | POA: Diagnosis not present

## 2018-02-22 DIAGNOSIS — D509 Iron deficiency anemia, unspecified: Secondary | ICD-10-CM | POA: Diagnosis not present

## 2018-02-22 DIAGNOSIS — N2581 Secondary hyperparathyroidism of renal origin: Secondary | ICD-10-CM | POA: Diagnosis not present

## 2018-02-22 DIAGNOSIS — D631 Anemia in chronic kidney disease: Secondary | ICD-10-CM | POA: Diagnosis not present

## 2018-02-22 DIAGNOSIS — N186 End stage renal disease: Secondary | ICD-10-CM | POA: Diagnosis not present

## 2018-02-22 DIAGNOSIS — E1129 Type 2 diabetes mellitus with other diabetic kidney complication: Secondary | ICD-10-CM | POA: Diagnosis not present

## 2018-02-22 DIAGNOSIS — Z992 Dependence on renal dialysis: Secondary | ICD-10-CM | POA: Diagnosis not present

## 2018-02-24 DIAGNOSIS — D631 Anemia in chronic kidney disease: Secondary | ICD-10-CM | POA: Diagnosis not present

## 2018-02-24 DIAGNOSIS — E1129 Type 2 diabetes mellitus with other diabetic kidney complication: Secondary | ICD-10-CM | POA: Diagnosis not present

## 2018-02-24 DIAGNOSIS — D509 Iron deficiency anemia, unspecified: Secondary | ICD-10-CM | POA: Diagnosis not present

## 2018-02-24 DIAGNOSIS — N2581 Secondary hyperparathyroidism of renal origin: Secondary | ICD-10-CM | POA: Diagnosis not present

## 2018-02-24 DIAGNOSIS — N186 End stage renal disease: Secondary | ICD-10-CM | POA: Diagnosis not present

## 2018-02-24 DIAGNOSIS — Z992 Dependence on renal dialysis: Secondary | ICD-10-CM | POA: Diagnosis not present

## 2018-02-26 DIAGNOSIS — N2581 Secondary hyperparathyroidism of renal origin: Secondary | ICD-10-CM | POA: Diagnosis not present

## 2018-02-26 DIAGNOSIS — Z992 Dependence on renal dialysis: Secondary | ICD-10-CM | POA: Diagnosis not present

## 2018-02-26 DIAGNOSIS — D509 Iron deficiency anemia, unspecified: Secondary | ICD-10-CM | POA: Diagnosis not present

## 2018-02-26 DIAGNOSIS — N186 End stage renal disease: Secondary | ICD-10-CM | POA: Diagnosis not present

## 2018-02-26 DIAGNOSIS — D631 Anemia in chronic kidney disease: Secondary | ICD-10-CM | POA: Diagnosis not present

## 2018-02-26 DIAGNOSIS — E1129 Type 2 diabetes mellitus with other diabetic kidney complication: Secondary | ICD-10-CM | POA: Diagnosis not present

## 2018-03-01 DIAGNOSIS — N2581 Secondary hyperparathyroidism of renal origin: Secondary | ICD-10-CM | POA: Diagnosis not present

## 2018-03-01 DIAGNOSIS — N186 End stage renal disease: Secondary | ICD-10-CM | POA: Diagnosis not present

## 2018-03-01 DIAGNOSIS — D509 Iron deficiency anemia, unspecified: Secondary | ICD-10-CM | POA: Diagnosis not present

## 2018-03-01 DIAGNOSIS — D631 Anemia in chronic kidney disease: Secondary | ICD-10-CM | POA: Diagnosis not present

## 2018-03-01 DIAGNOSIS — E1129 Type 2 diabetes mellitus with other diabetic kidney complication: Secondary | ICD-10-CM | POA: Diagnosis not present

## 2018-03-01 DIAGNOSIS — Z992 Dependence on renal dialysis: Secondary | ICD-10-CM | POA: Diagnosis not present

## 2018-03-03 DIAGNOSIS — N2581 Secondary hyperparathyroidism of renal origin: Secondary | ICD-10-CM | POA: Diagnosis not present

## 2018-03-03 DIAGNOSIS — N186 End stage renal disease: Secondary | ICD-10-CM | POA: Diagnosis not present

## 2018-03-03 DIAGNOSIS — Z992 Dependence on renal dialysis: Secondary | ICD-10-CM | POA: Diagnosis not present

## 2018-03-03 DIAGNOSIS — D631 Anemia in chronic kidney disease: Secondary | ICD-10-CM | POA: Diagnosis not present

## 2018-03-03 DIAGNOSIS — E1129 Type 2 diabetes mellitus with other diabetic kidney complication: Secondary | ICD-10-CM | POA: Diagnosis not present

## 2018-03-03 DIAGNOSIS — D509 Iron deficiency anemia, unspecified: Secondary | ICD-10-CM | POA: Diagnosis not present

## 2018-03-05 DIAGNOSIS — D509 Iron deficiency anemia, unspecified: Secondary | ICD-10-CM | POA: Diagnosis not present

## 2018-03-05 DIAGNOSIS — N2581 Secondary hyperparathyroidism of renal origin: Secondary | ICD-10-CM | POA: Diagnosis not present

## 2018-03-05 DIAGNOSIS — Z992 Dependence on renal dialysis: Secondary | ICD-10-CM | POA: Diagnosis not present

## 2018-03-05 DIAGNOSIS — D631 Anemia in chronic kidney disease: Secondary | ICD-10-CM | POA: Diagnosis not present

## 2018-03-05 DIAGNOSIS — N186 End stage renal disease: Secondary | ICD-10-CM | POA: Diagnosis not present

## 2018-03-05 DIAGNOSIS — E1129 Type 2 diabetes mellitus with other diabetic kidney complication: Secondary | ICD-10-CM | POA: Diagnosis not present

## 2018-03-08 DIAGNOSIS — N2581 Secondary hyperparathyroidism of renal origin: Secondary | ICD-10-CM | POA: Diagnosis not present

## 2018-03-08 DIAGNOSIS — E1129 Type 2 diabetes mellitus with other diabetic kidney complication: Secondary | ICD-10-CM | POA: Diagnosis not present

## 2018-03-08 DIAGNOSIS — N186 End stage renal disease: Secondary | ICD-10-CM | POA: Diagnosis not present

## 2018-03-08 DIAGNOSIS — D631 Anemia in chronic kidney disease: Secondary | ICD-10-CM | POA: Diagnosis not present

## 2018-03-08 DIAGNOSIS — Z992 Dependence on renal dialysis: Secondary | ICD-10-CM | POA: Diagnosis not present

## 2018-03-08 DIAGNOSIS — D509 Iron deficiency anemia, unspecified: Secondary | ICD-10-CM | POA: Diagnosis not present

## 2018-03-10 DIAGNOSIS — E1129 Type 2 diabetes mellitus with other diabetic kidney complication: Secondary | ICD-10-CM | POA: Diagnosis not present

## 2018-03-10 DIAGNOSIS — Z992 Dependence on renal dialysis: Secondary | ICD-10-CM | POA: Diagnosis not present

## 2018-03-10 DIAGNOSIS — N2581 Secondary hyperparathyroidism of renal origin: Secondary | ICD-10-CM | POA: Diagnosis not present

## 2018-03-10 DIAGNOSIS — D631 Anemia in chronic kidney disease: Secondary | ICD-10-CM | POA: Diagnosis not present

## 2018-03-10 DIAGNOSIS — D509 Iron deficiency anemia, unspecified: Secondary | ICD-10-CM | POA: Diagnosis not present

## 2018-03-10 DIAGNOSIS — N186 End stage renal disease: Secondary | ICD-10-CM | POA: Diagnosis not present

## 2018-03-11 ENCOUNTER — Encounter: Payer: Self-pay | Admitting: Nurse Practitioner

## 2018-03-11 ENCOUNTER — Ambulatory Visit (INDEPENDENT_AMBULATORY_CARE_PROVIDER_SITE_OTHER): Payer: Medicare Other | Admitting: Nurse Practitioner

## 2018-03-11 VITALS — BP 160/68 | HR 81 | Ht 65.0 in | Wt 196.0 lb

## 2018-03-11 DIAGNOSIS — Z8774 Personal history of (corrected) congenital malformations of heart and circulatory system: Secondary | ICD-10-CM | POA: Diagnosis not present

## 2018-03-11 DIAGNOSIS — K219 Gastro-esophageal reflux disease without esophagitis: Secondary | ICD-10-CM

## 2018-03-11 DIAGNOSIS — Z1211 Encounter for screening for malignant neoplasm of colon: Secondary | ICD-10-CM | POA: Diagnosis not present

## 2018-03-11 NOTE — Patient Instructions (Addendum)
If you are age 65 or older, your body mass index should be between 23-30. Your Body mass index is 32.62 kg/m. If this is out of the aforementioned range listed, please consider follow up with your Primary Care Provider.  If you are age 75 or younger, your body mass index should be between 19-25. Your Body mass index is 32.62 kg/m. If this is out of the aformentioned range listed, please consider follow up with your Primary Care Provider.   You have been scheduled for a colonoscopy. Please follow written instructions given to you at your visit today.  Please pick up your prep supplies at the pharmacy within the next 1-3 days. If you use inhalers (even only as needed), please bring them with you on the day of your procedure. Your physician has requested that you go to www.startemmi.com and enter the access code given to you at your visit today. This web site gives a general overview about your procedure. However, you should still follow specific instructions given to you by our office regarding your preparation for the procedure.  THE MORNING OF PROCEDURE HOLD NOVOLOG.    Thank you for choosing me and [facility].   Tye Savoy, NP

## 2018-03-11 NOTE — Progress Notes (Addendum)
P Primary GI:   Silvano Rusk, MD     Chief Complaint:    Hospital follow up  IMPRESSION and PLAN:    #19.  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  -bleeding resolved. Hgb rose appropriate by 3 grams following 3 units of blood, it was 8.6 by time of discharge.     #2.  Colon cancer screening.  She has never had a screening colonoscopy but is open to one. The risks and benefits of colonoscopy with possible polypectomy were discussed and the patient agrees to proceed.   3.  GERD.  Since meds changed to twice daily Prilosec in December patient has been asymptomatic.    Agree with Ms. Saahil Herbster's assessment and plan. [name], MD, [facility]   HPI:     Patient is a 67 year old female with DM, CHF with EF 45-50, hx of breast cancer, blindness and ESRD on HD, history of esophagitis with esophageal ulceration causing upper GI bleeding in December 2018.  She was admitted late July with weakness and acute on chronic anemia.  Presenting hemoglobin was 5.5, heme positive stools.  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  She received 3 units of packed red blood cells with appropriate rise in hemoglobin to 8.6.. No further bleeding. She has no GI complaints    Review of systems:     Positive for arthritis, cough, night sweats, tching.  No chest pain, no SOB, no fevers  Past Medical History:  Diagnosis Date  . Anemia   . Anxiety   . Arthritis    "joints" (06/15/2013)  . Blood transfusion without reported diagnosis   . Breast cancer ([facility])    left  . CHF (congestive heart failure) ([facility])   . ESRD (end stage renal disease) ([facility])    "suppose to start dialysis today" (06/15/2013)  . GERD (gastroesophageal reflux disease)   . Hypertension   . Myalgia 12/31/2011  . Neuropathy 12/31/2011  . Shortness of breath    "when she  doesn't go to dialysis"  . Type II diabetes mellitus ([facility])     Patient's surgical history, family medical history, social history, medications and allergies were all reviewed in Epic   Creatinine clearance cannot be calculated (Patient's most recent lab result is older than the maximum 21 days allowed.)  Current Outpatient Medications  Medication Sig Dispense Refill  . acetaminophen (TYLENOL) 500 MG tablet Take 500 mg by mouth 2 (two) times daily.    Marland Kitchen albuterol (PROVENTIL) (2.5 MG/3ML) 0.083% nebulizer solution Take 3 mLs (2.5 mg total) by nebulization every 6 (six) hours as needed for wheezing or shortness of breath. 75 mL 12  . amLODipine (NORVASC) 10 MG tablet Take 10 mg at bedtime by mouth.  0  . aspirin 81 MG chewable tablet Chew 81 mg by mouth daily with lunch.     Marland Kitchen atropine 1 % ophthalmic solution Place 1 drop into the right eye 2 (two) times daily.  0  . B Complex-C-Folic Acid (RENA-VITE RX) 1 MG TABS Take 1 tablet by mouth daily with lunch.   1  . carvedilol (COREG) 12.5 MG tablet Take 12.5 mg by mouth 2 (two) times daily with a meal.    . COMBIGAN 0.2-0.5 % ophthalmic solution Place 1 drop into the right eye every 12 (twelve) hours.     Marland Kitchen  darbepoetin (ARANESP) 200 MCG/0.4ML SOLN injection Inject 0.4 mLs (200 mcg total) into the vein every Wednesday with hemodialysis. 1.68 mL   . diphenhydrAMINE (BENADRYL) 25 MG tablet Take 12.5 mg by mouth every Monday, Wednesday, and Friday.    . docusate sodium (COLACE) 100 MG capsule Take 100 mg by mouth 2 (two) times daily.     Marland Kitchen doxercalciferol (HECTOROL) 4 MCG/2ML injection Inject 0.5 mLs (1 mcg total) into the vein every Monday, Wednesday, and Friday with hemodialysis. 2 mL   . fluticasone (FLONASE) 50 MCG/ACT nasal spray Place 1-2 sprays into both nostrils daily as needed for allergies or rhinitis.     Marland Kitchen gabapentin (NEURONTIN) 100 MG capsule Take 100 mg by mouth 2 (two) times daily.    . insulin aspart (NOVOLOG FLEXPEN) 100 UNIT/ML FlexPen  Inject 10-16 Units into the skin See admin instructions. Three times daily per Sliding scale    . lanthanum (FOSRENOL) 1000 MG chewable tablet Chew 2,000 mg by mouth 3 (three) times daily with meals. Take 1 tablet with snack    . lisinopril (PRINIVIL,ZESTRIL) 40 MG tablet Take 40 mg by mouth at bedtime.     Marland Kitchen omeprazole (PRILOSEC) 40 MG capsule Take 1 capsule (40 mg total) by mouth daily.    [name] test strip 3 (three) times daily. for testing  9  . prednisoLONE acetate (PRED FORTE) 1 % ophthalmic suspension Place 1 drop into the right eye 2 (two) times daily.   0   No current facility-administered medications for this visit.     Physical Exam:     BP (!) 160/68   Pulse 81   Ht 5\' 5"  (1.651 m)   Wt 196 lb (88.9 kg)   BMI 32.62 kg/m   GENERAL:  Pleasant female in NAD PSYCH: : Cooperative, normal affect EENT: Blind. Neck supple without masses CARDIAC:  RRR, + murmur heard, 1+ bilateral lower extremity edema.  No peripheral edema PULM: Normal respiratory effort, lungs CTA bilaterally, no wheezing ABDOMEN:  Nondistended, soft, nontender. No obvious masses, no hepatomegaly,  normal bowel sounds SKIN:  turgor, no lesions seen Musculoskeletal:  Normal muscle tone, normal strength NEURO: Alert and oriented x 3, no focal neurologic deficits   Tye Savoy , NP 03/11/2018, 11:14 AM

## 2018-03-12 DIAGNOSIS — D631 Anemia in chronic kidney disease: Secondary | ICD-10-CM | POA: Diagnosis not present

## 2018-03-12 DIAGNOSIS — N186 End stage renal disease: Secondary | ICD-10-CM | POA: Diagnosis not present

## 2018-03-12 DIAGNOSIS — Z992 Dependence on renal dialysis: Secondary | ICD-10-CM | POA: Diagnosis not present

## 2018-03-12 DIAGNOSIS — E1129 Type 2 diabetes mellitus with other diabetic kidney complication: Secondary | ICD-10-CM | POA: Diagnosis not present

## 2018-03-12 DIAGNOSIS — N2581 Secondary hyperparathyroidism of renal origin: Secondary | ICD-10-CM | POA: Diagnosis not present

## 2018-03-12 DIAGNOSIS — D509 Iron deficiency anemia, unspecified: Secondary | ICD-10-CM | POA: Diagnosis not present

## 2018-03-14 ENCOUNTER — Encounter: Payer: Self-pay | Admitting: Nurse Practitioner

## 2018-03-15 DIAGNOSIS — Z992 Dependence on renal dialysis: Secondary | ICD-10-CM | POA: Diagnosis not present

## 2018-03-15 DIAGNOSIS — N186 End stage renal disease: Secondary | ICD-10-CM | POA: Diagnosis not present

## 2018-03-15 DIAGNOSIS — D509 Iron deficiency anemia, unspecified: Secondary | ICD-10-CM | POA: Diagnosis not present

## 2018-03-15 DIAGNOSIS — D631 Anemia in chronic kidney disease: Secondary | ICD-10-CM | POA: Diagnosis not present

## 2018-03-15 DIAGNOSIS — E1129 Type 2 diabetes mellitus with other diabetic kidney complication: Secondary | ICD-10-CM | POA: Diagnosis not present

## 2018-03-15 DIAGNOSIS — N2581 Secondary hyperparathyroidism of renal origin: Secondary | ICD-10-CM | POA: Diagnosis not present

## 2018-03-17 DIAGNOSIS — N2581 Secondary hyperparathyroidism of renal origin: Secondary | ICD-10-CM | POA: Diagnosis not present

## 2018-03-17 DIAGNOSIS — N186 End stage renal disease: Secondary | ICD-10-CM | POA: Diagnosis not present

## 2018-03-17 DIAGNOSIS — D509 Iron deficiency anemia, unspecified: Secondary | ICD-10-CM | POA: Diagnosis not present

## 2018-03-17 DIAGNOSIS — D631 Anemia in chronic kidney disease: Secondary | ICD-10-CM | POA: Diagnosis not present

## 2018-03-17 DIAGNOSIS — E1129 Type 2 diabetes mellitus with other diabetic kidney complication: Secondary | ICD-10-CM | POA: Diagnosis not present

## 2018-03-17 DIAGNOSIS — Z992 Dependence on renal dialysis: Secondary | ICD-10-CM | POA: Diagnosis not present

## 2018-03-19 DIAGNOSIS — E1129 Type 2 diabetes mellitus with other diabetic kidney complication: Secondary | ICD-10-CM | POA: Diagnosis not present

## 2018-03-19 DIAGNOSIS — N186 End stage renal disease: Secondary | ICD-10-CM | POA: Diagnosis not present

## 2018-03-19 DIAGNOSIS — D631 Anemia in chronic kidney disease: Secondary | ICD-10-CM | POA: Diagnosis not present

## 2018-03-19 DIAGNOSIS — D509 Iron deficiency anemia, unspecified: Secondary | ICD-10-CM | POA: Diagnosis not present

## 2018-03-19 DIAGNOSIS — Z992 Dependence on renal dialysis: Secondary | ICD-10-CM | POA: Diagnosis not present

## 2018-03-19 DIAGNOSIS — N2581 Secondary hyperparathyroidism of renal origin: Secondary | ICD-10-CM | POA: Diagnosis not present

## 2018-03-21 DIAGNOSIS — N186 End stage renal disease: Secondary | ICD-10-CM | POA: Diagnosis not present

## 2018-03-21 DIAGNOSIS — Z992 Dependence on renal dialysis: Secondary | ICD-10-CM | POA: Diagnosis not present

## 2018-03-21 DIAGNOSIS — E1129 Type 2 diabetes mellitus with other diabetic kidney complication: Secondary | ICD-10-CM | POA: Diagnosis not present

## 2018-03-22 DIAGNOSIS — D631 Anemia in chronic kidney disease: Secondary | ICD-10-CM | POA: Diagnosis not present

## 2018-03-22 DIAGNOSIS — N2581 Secondary hyperparathyroidism of renal origin: Secondary | ICD-10-CM | POA: Diagnosis not present

## 2018-03-22 DIAGNOSIS — Z23 Encounter for immunization: Secondary | ICD-10-CM | POA: Diagnosis not present

## 2018-03-22 DIAGNOSIS — Z992 Dependence on renal dialysis: Secondary | ICD-10-CM | POA: Diagnosis not present

## 2018-03-22 DIAGNOSIS — D509 Iron deficiency anemia, unspecified: Secondary | ICD-10-CM | POA: Diagnosis not present

## 2018-03-22 DIAGNOSIS — E1129 Type 2 diabetes mellitus with other diabetic kidney complication: Secondary | ICD-10-CM | POA: Diagnosis not present

## 2018-03-22 DIAGNOSIS — N186 End stage renal disease: Secondary | ICD-10-CM | POA: Diagnosis not present

## 2018-03-24 DIAGNOSIS — Z992 Dependence on renal dialysis: Secondary | ICD-10-CM | POA: Diagnosis not present

## 2018-03-24 DIAGNOSIS — N186 End stage renal disease: Secondary | ICD-10-CM | POA: Diagnosis not present

## 2018-03-24 DIAGNOSIS — D509 Iron deficiency anemia, unspecified: Secondary | ICD-10-CM | POA: Diagnosis not present

## 2018-03-24 DIAGNOSIS — D631 Anemia in chronic kidney disease: Secondary | ICD-10-CM | POA: Diagnosis not present

## 2018-03-24 DIAGNOSIS — E1129 Type 2 diabetes mellitus with other diabetic kidney complication: Secondary | ICD-10-CM | POA: Diagnosis not present

## 2018-03-24 DIAGNOSIS — N2581 Secondary hyperparathyroidism of renal origin: Secondary | ICD-10-CM | POA: Diagnosis not present

## 2018-03-25 ENCOUNTER — Ambulatory Visit (AMBULATORY_SURGERY_CENTER): Payer: Medicare Other | Admitting: Internal Medicine

## 2018-03-25 ENCOUNTER — Telehealth: Payer: Self-pay | Admitting: Gastroenterology

## 2018-03-25 ENCOUNTER — Encounter: Payer: Self-pay | Admitting: Internal Medicine

## 2018-03-25 VITALS — BP 128/70 | HR 82 | Temp 97.3°F | Resp 17 | Ht 65.0 in | Wt 196.0 lb

## 2018-03-25 DIAGNOSIS — Z1211 Encounter for screening for malignant neoplasm of colon: Secondary | ICD-10-CM

## 2018-03-25 DIAGNOSIS — N184 Chronic kidney disease, stage 4 (severe): Secondary | ICD-10-CM | POA: Diagnosis not present

## 2018-03-25 DIAGNOSIS — D120 Benign neoplasm of cecum: Secondary | ICD-10-CM

## 2018-03-25 DIAGNOSIS — D124 Benign neoplasm of descending colon: Secondary | ICD-10-CM | POA: Diagnosis not present

## 2018-03-25 DIAGNOSIS — I509 Heart failure, unspecified: Secondary | ICD-10-CM | POA: Diagnosis not present

## 2018-03-25 DIAGNOSIS — D123 Benign neoplasm of transverse colon: Secondary | ICD-10-CM | POA: Diagnosis not present

## 2018-03-25 MED ORDER — SODIUM CHLORIDE 0.9 % IV SOLN: 500.0000 mL | Freq: Once | INTRAVENOUS | Status: DC

## 2018-03-25 NOTE — Progress Notes (Signed)
Report given to PACU, vss 

## 2018-03-25 NOTE — Progress Notes (Signed)
Pt is blind in both eyes.  Had dialysis yesterday.  Sister aided pt with undressing.  Pt went to restroom and I look at the results which were clear yellow.  maw

## 2018-03-25 NOTE — Patient Instructions (Addendum)
I found and removed 13 polyps. Nothing looks like cancer.  I will let you know pathology results and when to have another routine colonoscopy by mail and/or My Chart.  I appreciate the opportunity to care for you. [name], MD, FACG YOU HAD AN ENDOSCOPIC PROCEDURE TODAY AT [facility]:   Refer to the procedure report that was given to you for any specific questions about what was found during the examination.  If the procedure report does not answer your questions, please call your gastroenterologist to clarify.  If you requested that your care partner not be given the details of your procedure findings, then the procedure report has been included in a sealed envelope for you to review at your convenience later.  YOU SHOULD EXPECT: Some feelings of bloating in the abdomen. Passage of more gas than usual.  Walking can help get rid of the air that was put into your GI tract during the procedure and reduce the bloating. If you had a lower endoscopy (such as a colonoscopy or flexible sigmoidoscopy) you may notice spotting of blood in your stool or on the toilet paper. If you underwent a bowel prep for your procedure, you may not have a normal bowel movement for a few days.  Please Note:  You might notice some irritation and congestion in your nose or some drainage.  This is from the oxygen used during your procedure.  There is no need for concern and it should clear up in a day or so.  SYMPTOMS TO REPORT IMMEDIATELY:   Following lower endoscopy (colonoscopy or flexible sigmoidoscopy):  Excessive amounts of blood in the stool  Significant tenderness or worsening of abdominal pains  Swelling of the abdomen that is new, acute  Fever of 100F or higher  For urgent or emergent issues, a gastroenterologist can be reached at any hour by calling [phone].   DIET:  We do recommend a small meal at first, but then you may proceed to your regular diet.  Drink plenty of  fluids but you should avoid alcoholic beverages for 24 hours.  MEDICATIONS: Continue present medications.  Please see handouts given to you by your recovery nurse.  ACTIVITY:  You should plan to take it easy for the rest of today and you should NOT DRIVE or use heavy machinery until tomorrow (because of the sedation medicines used during the test).    FOLLOW UP: Our staff will call the number listed on your records the next business day following your procedure to check on you and address any questions or concerns that you may have regarding the information given to you following your procedure. If we do not reach you, we will leave a message.  However, if you are feeling well and you are not experiencing any problems, there is no need to return our call.  We will assume that you have returned to your regular daily activities without incident.  If any biopsies were taken you will be contacted by phone or by letter within the next 1-3 weeks.  Please call us at [phone] if you have not heard about the biopsies in 3 weeks.   Thank you for allowing [place] to provide for your healthcare needs today.   SIGNATURES/CONFIDENTIALITY: You and/or your care partner have signed paperwork which will be entered into your electronic medical record.  These signatures attest to the fact that that the information above on your After Visit Summary has been reviewed and is  understood.  Full responsibility of the confidentiality of this discharge information lies with you and/or your care-partner. 

## 2018-03-25 NOTE — Op Note (Signed)
[place] Patient Name: [name] Procedure Date: 03/25/2018 8:14 AM MRN: [id number] Endoscopist: [name] , MD Age: 67 Referring MD:  Date of Birth: 06/04/1951 Gender: Female Account #: [id number] Procedure:                Colonoscopy Indications:              Screening for colorectal malignant neoplasm Medicines:                Propofol per Anesthesia, Monitored Anesthesia Care Procedure:                Pre-Anesthesia Assessment:                           - Prior to the procedure, a History and Physical                            was performed, and patient medications and                            allergies were reviewed. The patient's tolerance of                            previous anesthesia was also reviewed. The risks                            and benefits of the procedure and the sedation                            options and risks were discussed with the patient.                            All questions were answered, and informed consent                            was obtained. Prior Anticoagulants: The patient has                            taken no previous anticoagulant or antiplatelet                            agents. ASA Grade Assessment: III - A patient with                            severe systemic disease. After reviewing the risks                            and benefits, the patient was deemed in                            satisfactory condition to undergo the procedure.                           After obtaining informed consent, the colonoscope  was passed under direct vision. Throughout the                            procedure, the patient's blood pressure, pulse, and                            oxygen saturations were monitored continuously. The                            Model PCF-[id number]) scope was introduced                            through the anus and advanced to the the cecum,             identified by appendiceal orifice and ileocecal                            valve. The colonoscopy was performed without                            difficulty. The patient tolerated the procedure                            well. The quality of the bowel preparation was                            good. The bowel preparation used was Miralax. The                            ileocecal valve, appendiceal orifice, and rectum                            were photographed. Scope In: 8:21:13 AM Scope Out: 8:52:48 AM Scope Withdrawal Time: 0 hours 26 minutes 36 seconds  Total Procedure Duration: 0 hours 31 minutes 35 seconds  Findings:                 Multiple sessile and semi-pedunculated polyps were                            found in the descending colon and transverse colon.                            The polyps were 3 to 10 mm in size. These polyps                            were removed with a cold snare. Resection and                            retrieval were complete. Verification of patient                            identification for the specimen was done. Estimated  blood loss was minimal.                           A 1 to 2 mm polyp was found in the cecum. The polyp                            was sessile. The polyp was removed with a cold                            biopsy forceps. Resection and retrieval were                            complete. Verification of patient identification                            for the specimen was done. Estimated blood loss was                            minimal.                           The exam was otherwise without abnormality on                            direct and retroflexion views. Complications:            No immediate complications. Estimated Blood Loss:     Estimated blood loss was minimal. Impression:               - Multiple 3 to 10 mm polyps in the descending                            colon and in the  transverse colon, removed with a                            cold snare. Resected and retrieved.                           - One 1 to 2 mm polyp in the cecum, removed with a                            cold biopsy forceps. Resected and retrieved.                           - The examination was otherwise normal on direct                            and retroflexion views. Recommendation:           - Patient has a contact number available for                            emergencies. The signs and symptoms of potential  delayed complications were discussed with the                            patient. Return to normal activities tomorrow.                            Written discharge instructions were provided to the                            patient.                           - Resume previous diet.                           - Continue present medications.                           - Repeat colonoscopy is recommended. The                            colonoscopy date will be determined after pathology                            results from today's exam become available for                            review. [name], MD 03/25/2018 9:01:23 AM This report has been signed electronically.

## 2018-03-25 NOTE — Progress Notes (Signed)
Called to room to assist during endoscopic procedure.  Patient ID and intended procedure confirmed with present staff. Received instructions for my participation in the procedure from the performing physician.  

## 2018-03-25 NOTE — Telephone Encounter (Signed)
Oncall Note: Patient's daughter called around midnight to ask if mom can eat something as she has diabetes and gets shakes in the early morning. Advised her to only drink clear liquids and avoid any solid food, follow prep instructions as recommended

## 2018-03-26 ENCOUNTER — Telehealth: Payer: Self-pay | Admitting: *Deleted

## 2018-03-26 DIAGNOSIS — D631 Anemia in chronic kidney disease: Secondary | ICD-10-CM | POA: Diagnosis not present

## 2018-03-26 DIAGNOSIS — N186 End stage renal disease: Secondary | ICD-10-CM | POA: Diagnosis not present

## 2018-03-26 DIAGNOSIS — Z992 Dependence on renal dialysis: Secondary | ICD-10-CM | POA: Diagnosis not present

## 2018-03-26 DIAGNOSIS — E1129 Type 2 diabetes mellitus with other diabetic kidney complication: Secondary | ICD-10-CM | POA: Diagnosis not present

## 2018-03-26 DIAGNOSIS — N2581 Secondary hyperparathyroidism of renal origin: Secondary | ICD-10-CM | POA: Diagnosis not present

## 2018-03-26 DIAGNOSIS — D509 Iron deficiency anemia, unspecified: Secondary | ICD-10-CM | POA: Diagnosis not present

## 2018-03-26 NOTE — Telephone Encounter (Signed)
No answer, left message to call if questions or concerns. 

## 2018-03-26 NOTE — Telephone Encounter (Signed)
No answer. Second call left message to call if questions or concerns.

## 2018-03-29 DIAGNOSIS — E1129 Type 2 diabetes mellitus with other diabetic kidney complication: Secondary | ICD-10-CM | POA: Diagnosis not present

## 2018-03-29 DIAGNOSIS — D509 Iron deficiency anemia, unspecified: Secondary | ICD-10-CM | POA: Diagnosis not present

## 2018-03-29 DIAGNOSIS — N186 End stage renal disease: Secondary | ICD-10-CM | POA: Diagnosis not present

## 2018-03-29 DIAGNOSIS — Z992 Dependence on renal dialysis: Secondary | ICD-10-CM | POA: Diagnosis not present

## 2018-03-29 DIAGNOSIS — N2581 Secondary hyperparathyroidism of renal origin: Secondary | ICD-10-CM | POA: Diagnosis not present

## 2018-03-29 DIAGNOSIS — D631 Anemia in chronic kidney disease: Secondary | ICD-10-CM | POA: Diagnosis not present

## 2018-03-31 DIAGNOSIS — D631 Anemia in chronic kidney disease: Secondary | ICD-10-CM | POA: Diagnosis not present

## 2018-03-31 DIAGNOSIS — Z992 Dependence on renal dialysis: Secondary | ICD-10-CM | POA: Diagnosis not present

## 2018-03-31 DIAGNOSIS — N2581 Secondary hyperparathyroidism of renal origin: Secondary | ICD-10-CM | POA: Diagnosis not present

## 2018-03-31 DIAGNOSIS — E1129 Type 2 diabetes mellitus with other diabetic kidney complication: Secondary | ICD-10-CM | POA: Diagnosis not present

## 2018-03-31 DIAGNOSIS — N186 End stage renal disease: Secondary | ICD-10-CM | POA: Diagnosis not present

## 2018-03-31 DIAGNOSIS — D509 Iron deficiency anemia, unspecified: Secondary | ICD-10-CM | POA: Diagnosis not present

## 2018-04-02 DIAGNOSIS — D509 Iron deficiency anemia, unspecified: Secondary | ICD-10-CM | POA: Diagnosis not present

## 2018-04-02 DIAGNOSIS — N2581 Secondary hyperparathyroidism of renal origin: Secondary | ICD-10-CM | POA: Diagnosis not present

## 2018-04-02 DIAGNOSIS — D631 Anemia in chronic kidney disease: Secondary | ICD-10-CM | POA: Diagnosis not present

## 2018-04-02 DIAGNOSIS — Z992 Dependence on renal dialysis: Secondary | ICD-10-CM | POA: Diagnosis not present

## 2018-04-02 DIAGNOSIS — N186 End stage renal disease: Secondary | ICD-10-CM | POA: Diagnosis not present

## 2018-04-02 DIAGNOSIS — E1129 Type 2 diabetes mellitus with other diabetic kidney complication: Secondary | ICD-10-CM | POA: Diagnosis not present

## 2018-04-05 DIAGNOSIS — D631 Anemia in chronic kidney disease: Secondary | ICD-10-CM | POA: Diagnosis not present

## 2018-04-05 DIAGNOSIS — D509 Iron deficiency anemia, unspecified: Secondary | ICD-10-CM | POA: Diagnosis not present

## 2018-04-05 DIAGNOSIS — E1129 Type 2 diabetes mellitus with other diabetic kidney complication: Secondary | ICD-10-CM | POA: Diagnosis not present

## 2018-04-05 DIAGNOSIS — Z992 Dependence on renal dialysis: Secondary | ICD-10-CM | POA: Diagnosis not present

## 2018-04-05 DIAGNOSIS — N2581 Secondary hyperparathyroidism of renal origin: Secondary | ICD-10-CM | POA: Diagnosis not present

## 2018-04-05 DIAGNOSIS — N186 End stage renal disease: Secondary | ICD-10-CM | POA: Diagnosis not present

## 2018-04-06 DIAGNOSIS — E1129 Type 2 diabetes mellitus with other diabetic kidney complication: Secondary | ICD-10-CM | POA: Diagnosis not present

## 2018-04-06 DIAGNOSIS — R300 Dysuria: Secondary | ICD-10-CM | POA: Diagnosis not present

## 2018-04-06 DIAGNOSIS — N186 End stage renal disease: Secondary | ICD-10-CM | POA: Diagnosis not present

## 2018-04-06 DIAGNOSIS — R3129 Other microscopic hematuria: Secondary | ICD-10-CM | POA: Diagnosis not present

## 2018-04-06 DIAGNOSIS — Z6833 Body mass index (BMI) 33.0-33.9, adult: Secondary | ICD-10-CM | POA: Diagnosis not present

## 2018-04-06 DIAGNOSIS — I10 Essential (primary) hypertension: Secondary | ICD-10-CM | POA: Diagnosis not present

## 2018-04-06 DIAGNOSIS — Z794 Long term (current) use of insulin: Secondary | ICD-10-CM | POA: Diagnosis not present

## 2018-04-07 ENCOUNTER — Encounter: Payer: Self-pay | Admitting: Internal Medicine

## 2018-04-07 DIAGNOSIS — Z992 Dependence on renal dialysis: Secondary | ICD-10-CM | POA: Diagnosis not present

## 2018-04-07 DIAGNOSIS — N2581 Secondary hyperparathyroidism of renal origin: Secondary | ICD-10-CM | POA: Diagnosis not present

## 2018-04-07 DIAGNOSIS — E1129 Type 2 diabetes mellitus with other diabetic kidney complication: Secondary | ICD-10-CM | POA: Diagnosis not present

## 2018-04-07 DIAGNOSIS — N186 End stage renal disease: Secondary | ICD-10-CM | POA: Diagnosis not present

## 2018-04-07 DIAGNOSIS — D509 Iron deficiency anemia, unspecified: Secondary | ICD-10-CM | POA: Diagnosis not present

## 2018-04-07 DIAGNOSIS — Z860101 Personal history of adenomatous and serrated colon polyps: Secondary | ICD-10-CM | POA: Insufficient documentation

## 2018-04-07 DIAGNOSIS — Z86010 Personal history of colonic polyps: Secondary | ICD-10-CM

## 2018-04-07 DIAGNOSIS — D631 Anemia in chronic kidney disease: Secondary | ICD-10-CM | POA: Diagnosis not present

## 2018-04-07 HISTORY — DX: Personal history of adenomatous and serrated colon polyps: Z86.0101

## 2018-04-07 HISTORY — DX: Personal history of colonic polyps: Z86.010

## 2018-04-07 NOTE — Progress Notes (Signed)
I am creating and sending a letter but she is blind  1) [name] - call her and explain 13 pre-cancerous polyps 2) Recall colon  1 year 3) Refer to genetics - 13 adenomas on first colonoscopy - explain she could have an abnormal gene that increases risk of colon cancer

## 2018-04-08 DIAGNOSIS — E8779 Other fluid overload: Secondary | ICD-10-CM | POA: Diagnosis not present

## 2018-04-08 DIAGNOSIS — N2581 Secondary hyperparathyroidism of renal origin: Secondary | ICD-10-CM | POA: Diagnosis not present

## 2018-04-08 DIAGNOSIS — N186 End stage renal disease: Secondary | ICD-10-CM | POA: Diagnosis not present

## 2018-04-09 DIAGNOSIS — N186 End stage renal disease: Secondary | ICD-10-CM | POA: Diagnosis not present

## 2018-04-09 DIAGNOSIS — D509 Iron deficiency anemia, unspecified: Secondary | ICD-10-CM | POA: Diagnosis not present

## 2018-04-09 DIAGNOSIS — D631 Anemia in chronic kidney disease: Secondary | ICD-10-CM | POA: Diagnosis not present

## 2018-04-09 DIAGNOSIS — E1129 Type 2 diabetes mellitus with other diabetic kidney complication: Secondary | ICD-10-CM | POA: Diagnosis not present

## 2018-04-09 DIAGNOSIS — Z992 Dependence on renal dialysis: Secondary | ICD-10-CM | POA: Diagnosis not present

## 2018-04-09 DIAGNOSIS — N2581 Secondary hyperparathyroidism of renal origin: Secondary | ICD-10-CM | POA: Diagnosis not present

## 2018-04-12 DIAGNOSIS — Z992 Dependence on renal dialysis: Secondary | ICD-10-CM | POA: Diagnosis not present

## 2018-04-12 DIAGNOSIS — N2581 Secondary hyperparathyroidism of renal origin: Secondary | ICD-10-CM | POA: Diagnosis not present

## 2018-04-12 DIAGNOSIS — D509 Iron deficiency anemia, unspecified: Secondary | ICD-10-CM | POA: Diagnosis not present

## 2018-04-12 DIAGNOSIS — D631 Anemia in chronic kidney disease: Secondary | ICD-10-CM | POA: Diagnosis not present

## 2018-04-12 DIAGNOSIS — N186 End stage renal disease: Secondary | ICD-10-CM | POA: Diagnosis not present

## 2018-04-12 DIAGNOSIS — E1129 Type 2 diabetes mellitus with other diabetic kidney complication: Secondary | ICD-10-CM | POA: Diagnosis not present

## 2018-04-13 ENCOUNTER — Encounter (INDEPENDENT_AMBULATORY_CARE_PROVIDER_SITE_OTHER): Payer: Self-pay | Admitting: Orthopaedic Surgery

## 2018-04-13 ENCOUNTER — Ambulatory Visit (INDEPENDENT_AMBULATORY_CARE_PROVIDER_SITE_OTHER): Payer: Medicare Other | Admitting: Orthopaedic Surgery

## 2018-04-13 DIAGNOSIS — G5602 Carpal tunnel syndrome, left upper limb: Secondary | ICD-10-CM

## 2018-04-13 DIAGNOSIS — Z9889 Other specified postprocedural states: Secondary | ICD-10-CM | POA: Diagnosis not present

## 2018-04-13 DIAGNOSIS — G5601 Carpal tunnel syndrome, right upper limb: Secondary | ICD-10-CM

## 2018-04-13 NOTE — Progress Notes (Signed)
The patient is well-known to [place].  She has bilateral severe carpal tunnel syndrome and 5 months ago we did perform a left open carpal tunnel release.  She also is a diabetic and does have end-stage renal disease.  She is on dialysis.  She also has a history of neuropathy.  She is not report excellent blood glucose control.  On examination of her hands her left carpal tunnel incision is improved greatly.  She still has subjective numbness and her left hand and significant numbness in the right hand.  Both hands are weak pinch and grip strength.  Again her carpal tunnel syndrome on nerve studies was quite severe.  She understands she would likely not get full recovery based on her pre-existing neuropathy with poor diabetic control and being on dialysis.  I gave her a prescription to try compressive gloves that may help and she should continue carpal tunnel splints as well will provide those for her today.  We will see her back in about 6 weeks to see how she is doing overall.  She would still like to have a right carpal tunnel release at some point.

## 2018-04-14 DIAGNOSIS — E1129 Type 2 diabetes mellitus with other diabetic kidney complication: Secondary | ICD-10-CM | POA: Diagnosis not present

## 2018-04-14 DIAGNOSIS — N2581 Secondary hyperparathyroidism of renal origin: Secondary | ICD-10-CM | POA: Diagnosis not present

## 2018-04-14 DIAGNOSIS — Z992 Dependence on renal dialysis: Secondary | ICD-10-CM | POA: Diagnosis not present

## 2018-04-14 DIAGNOSIS — D631 Anemia in chronic kidney disease: Secondary | ICD-10-CM | POA: Diagnosis not present

## 2018-04-14 DIAGNOSIS — D509 Iron deficiency anemia, unspecified: Secondary | ICD-10-CM | POA: Diagnosis not present

## 2018-04-14 DIAGNOSIS — N186 End stage renal disease: Secondary | ICD-10-CM | POA: Diagnosis not present

## 2018-04-16 DIAGNOSIS — Z992 Dependence on renal dialysis: Secondary | ICD-10-CM | POA: Diagnosis not present

## 2018-04-16 DIAGNOSIS — D509 Iron deficiency anemia, unspecified: Secondary | ICD-10-CM | POA: Diagnosis not present

## 2018-04-16 DIAGNOSIS — E1129 Type 2 diabetes mellitus with other diabetic kidney complication: Secondary | ICD-10-CM | POA: Diagnosis not present

## 2018-04-16 DIAGNOSIS — D631 Anemia in chronic kidney disease: Secondary | ICD-10-CM | POA: Diagnosis not present

## 2018-04-16 DIAGNOSIS — N2581 Secondary hyperparathyroidism of renal origin: Secondary | ICD-10-CM | POA: Diagnosis not present

## 2018-04-16 DIAGNOSIS — N186 End stage renal disease: Secondary | ICD-10-CM | POA: Diagnosis not present

## 2018-04-19 ENCOUNTER — Other Ambulatory Visit: Payer: Self-pay

## 2018-04-19 DIAGNOSIS — D509 Iron deficiency anemia, unspecified: Secondary | ICD-10-CM | POA: Diagnosis not present

## 2018-04-19 DIAGNOSIS — D631 Anemia in chronic kidney disease: Secondary | ICD-10-CM | POA: Diagnosis not present

## 2018-04-19 DIAGNOSIS — N2581 Secondary hyperparathyroidism of renal origin: Secondary | ICD-10-CM | POA: Diagnosis not present

## 2018-04-19 DIAGNOSIS — D126 Benign neoplasm of colon, unspecified: Secondary | ICD-10-CM

## 2018-04-19 DIAGNOSIS — E1129 Type 2 diabetes mellitus with other diabetic kidney complication: Secondary | ICD-10-CM | POA: Diagnosis not present

## 2018-04-19 DIAGNOSIS — Z992 Dependence on renal dialysis: Secondary | ICD-10-CM | POA: Diagnosis not present

## 2018-04-19 DIAGNOSIS — N186 End stage renal disease: Secondary | ICD-10-CM | POA: Diagnosis not present

## 2018-04-20 DIAGNOSIS — Z992 Dependence on renal dialysis: Secondary | ICD-10-CM | POA: Diagnosis not present

## 2018-04-20 DIAGNOSIS — E1129 Type 2 diabetes mellitus with other diabetic kidney complication: Secondary | ICD-10-CM | POA: Diagnosis not present

## 2018-04-20 DIAGNOSIS — N186 End stage renal disease: Secondary | ICD-10-CM | POA: Diagnosis not present

## 2018-04-21 DIAGNOSIS — N2581 Secondary hyperparathyroidism of renal origin: Secondary | ICD-10-CM | POA: Diagnosis not present

## 2018-04-21 DIAGNOSIS — Z23 Encounter for immunization: Secondary | ICD-10-CM | POA: Diagnosis not present

## 2018-04-21 DIAGNOSIS — Z992 Dependence on renal dialysis: Secondary | ICD-10-CM | POA: Diagnosis not present

## 2018-04-21 DIAGNOSIS — D631 Anemia in chronic kidney disease: Secondary | ICD-10-CM | POA: Diagnosis not present

## 2018-04-21 DIAGNOSIS — N186 End stage renal disease: Secondary | ICD-10-CM | POA: Diagnosis not present

## 2018-04-21 DIAGNOSIS — E1129 Type 2 diabetes mellitus with other diabetic kidney complication: Secondary | ICD-10-CM | POA: Diagnosis not present

## 2018-04-23 DIAGNOSIS — N186 End stage renal disease: Secondary | ICD-10-CM | POA: Diagnosis not present

## 2018-04-23 DIAGNOSIS — Z23 Encounter for immunization: Secondary | ICD-10-CM | POA: Diagnosis not present

## 2018-04-23 DIAGNOSIS — Z992 Dependence on renal dialysis: Secondary | ICD-10-CM | POA: Diagnosis not present

## 2018-04-23 DIAGNOSIS — N2581 Secondary hyperparathyroidism of renal origin: Secondary | ICD-10-CM | POA: Diagnosis not present

## 2018-04-23 DIAGNOSIS — D631 Anemia in chronic kidney disease: Secondary | ICD-10-CM | POA: Diagnosis not present

## 2018-04-23 DIAGNOSIS — E1129 Type 2 diabetes mellitus with other diabetic kidney complication: Secondary | ICD-10-CM | POA: Diagnosis not present

## 2018-04-26 DIAGNOSIS — D631 Anemia in chronic kidney disease: Secondary | ICD-10-CM | POA: Diagnosis not present

## 2018-04-26 DIAGNOSIS — Z23 Encounter for immunization: Secondary | ICD-10-CM | POA: Diagnosis not present

## 2018-04-26 DIAGNOSIS — N186 End stage renal disease: Secondary | ICD-10-CM | POA: Diagnosis not present

## 2018-04-26 DIAGNOSIS — N2581 Secondary hyperparathyroidism of renal origin: Secondary | ICD-10-CM | POA: Diagnosis not present

## 2018-04-26 DIAGNOSIS — Z992 Dependence on renal dialysis: Secondary | ICD-10-CM | POA: Diagnosis not present

## 2018-04-26 DIAGNOSIS — E1129 Type 2 diabetes mellitus with other diabetic kidney complication: Secondary | ICD-10-CM | POA: Diagnosis not present

## 2018-04-28 DIAGNOSIS — Z23 Encounter for immunization: Secondary | ICD-10-CM | POA: Diagnosis not present

## 2018-04-28 DIAGNOSIS — N186 End stage renal disease: Secondary | ICD-10-CM | POA: Diagnosis not present

## 2018-04-28 DIAGNOSIS — D631 Anemia in chronic kidney disease: Secondary | ICD-10-CM | POA: Diagnosis not present

## 2018-04-28 DIAGNOSIS — N2581 Secondary hyperparathyroidism of renal origin: Secondary | ICD-10-CM | POA: Diagnosis not present

## 2018-04-28 DIAGNOSIS — E1129 Type 2 diabetes mellitus with other diabetic kidney complication: Secondary | ICD-10-CM | POA: Diagnosis not present

## 2018-04-28 DIAGNOSIS — Z992 Dependence on renal dialysis: Secondary | ICD-10-CM | POA: Diagnosis not present

## 2018-04-30 DIAGNOSIS — N2581 Secondary hyperparathyroidism of renal origin: Secondary | ICD-10-CM | POA: Diagnosis not present

## 2018-04-30 DIAGNOSIS — N186 End stage renal disease: Secondary | ICD-10-CM | POA: Diagnosis not present

## 2018-04-30 DIAGNOSIS — Z992 Dependence on renal dialysis: Secondary | ICD-10-CM | POA: Diagnosis not present

## 2018-04-30 DIAGNOSIS — E1129 Type 2 diabetes mellitus with other diabetic kidney complication: Secondary | ICD-10-CM | POA: Diagnosis not present

## 2018-04-30 DIAGNOSIS — D631 Anemia in chronic kidney disease: Secondary | ICD-10-CM | POA: Diagnosis not present

## 2018-04-30 DIAGNOSIS — Z23 Encounter for immunization: Secondary | ICD-10-CM | POA: Diagnosis not present

## 2018-05-03 DIAGNOSIS — Z992 Dependence on renal dialysis: Secondary | ICD-10-CM | POA: Diagnosis not present

## 2018-05-03 DIAGNOSIS — N186 End stage renal disease: Secondary | ICD-10-CM | POA: Diagnosis not present

## 2018-05-03 DIAGNOSIS — Z23 Encounter for immunization: Secondary | ICD-10-CM | POA: Diagnosis not present

## 2018-05-03 DIAGNOSIS — N2581 Secondary hyperparathyroidism of renal origin: Secondary | ICD-10-CM | POA: Diagnosis not present

## 2018-05-03 DIAGNOSIS — D631 Anemia in chronic kidney disease: Secondary | ICD-10-CM | POA: Diagnosis not present

## 2018-05-03 DIAGNOSIS — E1129 Type 2 diabetes mellitus with other diabetic kidney complication: Secondary | ICD-10-CM | POA: Diagnosis not present

## 2018-05-05 DIAGNOSIS — D631 Anemia in chronic kidney disease: Secondary | ICD-10-CM | POA: Diagnosis not present

## 2018-05-05 DIAGNOSIS — Z992 Dependence on renal dialysis: Secondary | ICD-10-CM | POA: Diagnosis not present

## 2018-05-05 DIAGNOSIS — N186 End stage renal disease: Secondary | ICD-10-CM | POA: Diagnosis not present

## 2018-05-05 DIAGNOSIS — N2581 Secondary hyperparathyroidism of renal origin: Secondary | ICD-10-CM | POA: Diagnosis not present

## 2018-05-05 DIAGNOSIS — Z23 Encounter for immunization: Secondary | ICD-10-CM | POA: Diagnosis not present

## 2018-05-05 DIAGNOSIS — E1129 Type 2 diabetes mellitus with other diabetic kidney complication: Secondary | ICD-10-CM | POA: Diagnosis not present

## 2018-05-06 ENCOUNTER — Encounter: Payer: Self-pay | Admitting: Podiatry

## 2018-05-06 ENCOUNTER — Other Ambulatory Visit: Payer: Self-pay

## 2018-05-06 ENCOUNTER — Emergency Department (HOSPITAL_COMMUNITY): Payer: Medicare Other

## 2018-05-06 ENCOUNTER — Inpatient Hospital Stay (HOSPITAL_COMMUNITY)
Admission: EM | Admit: 2018-05-06 | Discharge: 2018-05-08 | DRG: 193 | Disposition: A | Discharge status: Home / Self Care | Payer: Medicare Other | Attending: Internal Medicine | Admitting: Internal Medicine

## 2018-05-06 ENCOUNTER — Ambulatory Visit (INDEPENDENT_AMBULATORY_CARE_PROVIDER_SITE_OTHER): Payer: Medicare Other | Admitting: Podiatry

## 2018-05-06 ENCOUNTER — Encounter (HOSPITAL_COMMUNITY): Payer: Self-pay | Admitting: Emergency Medicine

## 2018-05-06 DIAGNOSIS — I5042 Chronic combined systolic (congestive) and diastolic (congestive) heart failure: Secondary | ICD-10-CM | POA: Diagnosis present

## 2018-05-06 DIAGNOSIS — E1139 Type 2 diabetes mellitus with other diabetic ophthalmic complication: Secondary | ICD-10-CM | POA: Diagnosis present

## 2018-05-06 DIAGNOSIS — E1129 Type 2 diabetes mellitus with other diabetic kidney complication: Secondary | ICD-10-CM | POA: Diagnosis not present

## 2018-05-06 DIAGNOSIS — Z90710 Acquired absence of both cervix and uterus: Secondary | ICD-10-CM

## 2018-05-06 DIAGNOSIS — R1013 Epigastric pain: Secondary | ICD-10-CM | POA: Diagnosis present

## 2018-05-06 DIAGNOSIS — Z794 Long term (current) use of insulin: Secondary | ICD-10-CM | POA: Diagnosis not present

## 2018-05-06 DIAGNOSIS — Z833 Family history of diabetes mellitus: Secondary | ICD-10-CM

## 2018-05-06 DIAGNOSIS — M79671 Pain in right foot: Secondary | ICD-10-CM | POA: Diagnosis not present

## 2018-05-06 DIAGNOSIS — M79672 Pain in left foot: Secondary | ICD-10-CM | POA: Diagnosis not present

## 2018-05-06 DIAGNOSIS — Z8349 Family history of other endocrine, nutritional and metabolic diseases: Secondary | ICD-10-CM

## 2018-05-06 DIAGNOSIS — Z853 Personal history of malignant neoplasm of breast: Secondary | ICD-10-CM

## 2018-05-06 DIAGNOSIS — Z9842 Cataract extraction status, left eye: Secondary | ICD-10-CM

## 2018-05-06 DIAGNOSIS — I132 Hypertensive heart and chronic kidney disease with heart failure and with stage 5 chronic kidney disease, or end stage renal disease: Secondary | ICD-10-CM | POA: Diagnosis present

## 2018-05-06 DIAGNOSIS — D631 Anemia in chronic kidney disease: Secondary | ICD-10-CM | POA: Diagnosis not present

## 2018-05-06 DIAGNOSIS — D649 Anemia, unspecified: Secondary | ICD-10-CM | POA: Diagnosis not present

## 2018-05-06 DIAGNOSIS — J45909 Unspecified asthma, uncomplicated: Secondary | ICD-10-CM | POA: Diagnosis present

## 2018-05-06 DIAGNOSIS — Z885 Allergy status to narcotic agent status: Secondary | ICD-10-CM

## 2018-05-06 DIAGNOSIS — I700 Atherosclerosis of aorta: Secondary | ICD-10-CM | POA: Diagnosis present

## 2018-05-06 DIAGNOSIS — Z9841 Cataract extraction status, right eye: Secondary | ICD-10-CM

## 2018-05-06 DIAGNOSIS — K219 Gastro-esophageal reflux disease without esophagitis: Secondary | ICD-10-CM | POA: Diagnosis present

## 2018-05-06 DIAGNOSIS — R042 Hemoptysis: Secondary | ICD-10-CM | POA: Diagnosis not present

## 2018-05-06 DIAGNOSIS — K922 Gastrointestinal hemorrhage, unspecified: Secondary | ICD-10-CM | POA: Insufficient documentation

## 2018-05-06 DIAGNOSIS — Z841 Family history of disorders of kidney and ureter: Secondary | ICD-10-CM

## 2018-05-06 DIAGNOSIS — N186 End stage renal disease: Secondary | ICD-10-CM | POA: Diagnosis not present

## 2018-05-06 DIAGNOSIS — J4520 Mild intermittent asthma, uncomplicated: Secondary | ICD-10-CM | POA: Diagnosis not present

## 2018-05-06 DIAGNOSIS — E1140 Type 2 diabetes mellitus with diabetic neuropathy, unspecified: Secondary | ICD-10-CM | POA: Diagnosis present

## 2018-05-06 DIAGNOSIS — Z6833 Body mass index (BMI) 33.0-33.9, adult: Secondary | ICD-10-CM | POA: Diagnosis not present

## 2018-05-06 DIAGNOSIS — Z8249 Family history of ischemic heart disease and other diseases of the circulatory system: Secondary | ICD-10-CM

## 2018-05-06 DIAGNOSIS — H409 Unspecified glaucoma: Secondary | ICD-10-CM | POA: Diagnosis present

## 2018-05-06 DIAGNOSIS — H543 Unqualified visual loss, both eyes: Secondary | ICD-10-CM | POA: Diagnosis not present

## 2018-05-06 DIAGNOSIS — B351 Tinea unguium: Secondary | ICD-10-CM

## 2018-05-06 DIAGNOSIS — Z992 Dependence on renal dialysis: Secondary | ICD-10-CM | POA: Diagnosis not present

## 2018-05-06 DIAGNOSIS — N2581 Secondary hyperparathyroidism of renal origin: Secondary | ICD-10-CM | POA: Diagnosis not present

## 2018-05-06 DIAGNOSIS — Z1211 Encounter for screening for malignant neoplasm of colon: Secondary | ICD-10-CM

## 2018-05-06 DIAGNOSIS — Z91040 Latex allergy status: Secondary | ICD-10-CM

## 2018-05-06 DIAGNOSIS — Z7982 Long term (current) use of aspirin: Secondary | ICD-10-CM

## 2018-05-06 DIAGNOSIS — J189 Pneumonia, unspecified organism: Principal | ICD-10-CM | POA: Diagnosis present

## 2018-05-06 DIAGNOSIS — Z86010 Personal history of colonic polyps: Secondary | ICD-10-CM

## 2018-05-06 DIAGNOSIS — Z91048 Other nonmedicinal substance allergy status: Secondary | ICD-10-CM

## 2018-05-06 DIAGNOSIS — M1990 Unspecified osteoarthritis, unspecified site: Secondary | ICD-10-CM | POA: Diagnosis present

## 2018-05-06 DIAGNOSIS — E1122 Type 2 diabetes mellitus with diabetic chronic kidney disease: Secondary | ICD-10-CM

## 2018-05-06 DIAGNOSIS — I10 Essential (primary) hypertension: Secondary | ICD-10-CM

## 2018-05-06 DIAGNOSIS — I2510 Atherosclerotic heart disease of native coronary artery without angina pectoris: Secondary | ICD-10-CM | POA: Diagnosis present

## 2018-05-06 DIAGNOSIS — R079 Chest pain, unspecified: Secondary | ICD-10-CM | POA: Diagnosis not present

## 2018-05-06 DIAGNOSIS — R05 Cough: Secondary | ICD-10-CM | POA: Diagnosis not present

## 2018-05-06 LAB — BASIC METABOLIC PANEL
Anion gap: 16 — ABNORMAL HIGH (ref 5–15)
BUN: 32 mg/dL — ABNORMAL HIGH (ref 8–23)
CALCIUM: 9.4 mg/dL (ref 8.9–10.3)
CO2: 25 mmol/L (ref 22–32)
Chloride: 97 mmol/L — ABNORMAL LOW (ref 98–111)
Creatinine, Ser: 8.81 mg/dL — ABNORMAL HIGH (ref 0.44–1.00)
GFR calc non Af Amer: 4 mL/min — ABNORMAL LOW (ref >60)
GFR, EST AFRICAN AMERICAN: 5 mL/min — AB (ref >60)
Glucose, Bld: 163 mg/dL — ABNORMAL HIGH (ref 70–99)
Potassium: 4.9 mmol/L (ref 3.5–5.1)
SODIUM: 138 mmol/L (ref 135–145)

## 2018-05-06 LAB — CBC WITH DIFFERENTIAL/PLATELET
ABS IMMATURE GRANULOCYTES: 0.02 10*3/uL (ref 0.00–0.07)
Basophils Absolute: 0.1 10*3/uL (ref 0.0–0.1)
Basophils Relative: 1 %
EOS PCT: 3 %
Eosinophils Absolute: 0.2 10*3/uL (ref 0.0–0.5)
HCT: 30.3 % — ABNORMAL LOW (ref 36.0–46.0)
HEMOGLOBIN: 9.4 g/dL — AB (ref 12.0–15.0)
Immature Granulocytes: 0 %
LYMPHS PCT: 29 %
Lymphs Abs: 2.3 10*3/uL (ref 0.7–4.0)
MCH: 28.8 pg (ref 26.0–34.0)
MCHC: 31 g/dL (ref 30.0–36.0)
MCV: 92.9 fL (ref 80.0–100.0)
MONO ABS: 0.8 10*3/uL (ref 0.1–1.0)
MONOS PCT: 11 %
NEUTROS ABS: 4.4 10*3/uL (ref 1.7–7.7)
Neutrophils Relative %: 56 %
Platelets: 218 10*3/uL (ref 150–400)
RBC: 3.26 MIL/uL — ABNORMAL LOW (ref 3.87–5.11)
RDW: 15.9 % — ABNORMAL HIGH (ref 11.5–15.5)
WBC: 7.8 10*3/uL (ref 4.0–10.5)
nRBC: 0 % (ref 0.0–0.2)

## 2018-05-06 MED ORDER — SODIUM CHLORIDE 0.9 % IV SOLN
500.0000 mg | Freq: Once | INTRAVENOUS | Status: AC
  Administered 2018-05-06: 500 mg via INTRAVENOUS
  Filled 2018-05-06: qty 500

## 2018-05-06 MED ORDER — SODIUM CHLORIDE 0.9 % IV SOLN
1.0000 g | Freq: Once | INTRAVENOUS | Status: AC
  Administered 2018-05-06: 1 g via INTRAVENOUS
  Filled 2018-05-06: qty 10

## 2018-05-06 NOTE — H&P (Signed)
History and Physical    [name] JSE:[id number] DOB: May 13, 1951 DOA: 05/06/2018  Referring MD/NP/PA:   PCP: Prince Solian, MD   Patient coming from:  The patient is coming from home.  At baseline, pt is independent for most of ADL.  Chief Complaint: Hemoptysis  HPI: [name] is a 67 y.o. female with medical history significant of hypertension, diabetes mellitus, asthma, GERD, anxiety, ESRD-HD (MWF), bilateral eye blindness, breast cancer, GI bleeding, CHF with EF of 45%, who presents with hemoptysis.  Patient states that she has had several episode of hemoptysis in the past 2 days.  He coughs up dark-colored blood clots.  Denies any chest pain.  She has mild shortness of breath. No fever, but has chills.  Patient was seen by PCP who did chest x-ray today, which showed left sided infiltration, therefore sent to ED for further evaluation treatment.  Patient states that she has epigastric discomfort, but denies nausea, vomiting or diarrhea.  She still makes little urine, but no symptoms of UTI.  She had dialysis on Wednesday.  No unilateral weakness.  Per ED physician, patient had hemoglobin 10.4 on the blood test in PCPs office, which decreased to 9.4 in ED.  ED Course: pt was found to have WBC 7.8, lipase 45, potassium 4.9, bicarbonate 25, BUN 32, creatinine 8.81, temperature normal, no tachycardia, oxygen saturation 91 to 95%, RR 16-22, chest x-ray showed cardiomegaly and mild vascular congestion in ED.  Patient is admitted to stepdown as inpatient.  CT-chest without contrast showed:  1. Septal thickening and ground-glass opacities consistent with pulmonary edema. Cardiomegaly appears similar to prior exam. 2. Multifocal regions of ill-defined nodularity and slightly more patchy opacity, most prominent in the left upper lobe, may be superimposed infectious or inflammatory process. Pulmonary nodules are new from prior exam in presumed inflammatory, however recommend follow-up  CT in 12 months. 3. Aortic atherosclerosis and coronary artery calcifications. Mitral annulus calcifications. Mild distal esophageal wall thickening can be seen with reflux or esophagitis   Review of Systems:   General: no fevers, has chills, no body weight gain, has poor appetite, has fatigue HEENT: no hearing changes or sore throat.  Has blindness. Respiratory: has dyspnea, coughing, hemoptysis, no wheezing CV: no chest pain, no palpitations GI: no nausea, vomiting, abdominal pain, diarrhea, constipation GU: no dysuria, burning on urination, increased urinary frequency, hematuria  Ext: has mild leg edema Neuro: no unilateral weakness, numbness, or tingling, no vision change or hearing loss Skin: no rash, no skin tear. MSK: No muscle spasm, no deformity, no limitation of range of movement in spin Heme: No easy bruising.  Travel history: No recent long distant travel.  Allergy:  Allergies  Allergen Reactions  . Latex Hives  . Oxycodone Other (See Comments)    Hallucinations   . Tramadol Other (See Comments)    hallucinations   . Tape Rash    21M Transpore adhesive tape. Medical tape  . Vicodin [Hydrocodone-Acetaminophen] Other (See Comments)    hallucinations    Past Medical History:  Diagnosis Date  . Anemia   . Anxiety   . Arthritis    "joints" (06/15/2013)  . Asthma   . 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 Blood transfusion without reported diagnosis   . Breast cancer ([place])    left  . CHF (congestive heart failure) ([place])   . 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 Esophageal ulcer with bleeding   . ESRD (  end stage renal disease) ([place])    "suppose to start dialysis today" (06/15/2013)  . GERD (gastroesophageal reflux disease)   . Glaucoma    blind in both eyes  . Heart murmur   . Hx of adenomatous colonic polyps 04/07/2018  . Hypertension   . Myalgia 12/31/2011  . Neuropathy 12/31/2011  . Shortness of breath    "when she doesn't go to  dialysis"  . Type II diabetes mellitus ([place])     Past Surgical History:  Procedure Laterality Date  . ABDOMINAL HYSTERECTOMY     partial  . AV FISTULA PLACEMENT Right 06/06/2013   Procedure: ARTERIOVENOUS (AV) FISTULA CREATION-RIGHT BRACHIAL CEPHALIC;  Surgeon: Conrad Cascade, MD;  Location: [facility] [facility];  Service: Vascular;  Laterality: Right;  . BREAST BIOPSY Left   . BREAST LUMPECTOMY Left    "and took out some lymph nodes" (06/15/2013)  . CARPAL TUNNEL RELEASE Left 11/26/2017   Procedure: LEFT CARPAL TUNNEL RELEASE;  Surgeon: [name], MD;  Location: [place];  Service: Orthopedics;  Laterality: Left;  . CATARACT EXTRACTION W/ ANTERIOR VITRECTOMY Bilateral   . CESAREAN SECTION  1980  . COLONOSCOPY    . ESOPHAGOGASTRODUODENOSCOPY N/A 07/08/2017   Procedure: ESOPHAGOGASTRODUODENOSCOPY (EGD);  Surgeon: [name], MD;  Location: [facility] ENDOSCOPY;  Service: Endoscopy;  Laterality: N/A;  . ESOPHAGOGASTRODUODENOSCOPY (EGD) WITH PROPOFOL N/A 02/07/2018   Procedure: ESOPHAGOGASTRODUODENOSCOPY (EGD) WITH PROPOFOL;  Surgeon: [name], MD;  Location: [facility] ENDOSCOPY;  Service: Gastroenterology;  Laterality: N/A;  APC and clips placed  . EYE SURGERY Bilateral    laser surgery  . PARS PLANA VITRECTOMY Right 05/05/2017   Procedure: PARS PLANA VITRECTOMY WITH 25 GAUGE; PARTIAL REMOVAL OF OIL; INFERIOR PERIPHERAL IRIDECTOMY, REFORM ANTERIOR CHAMBER RIGHT EYE;  Surgeon: [name], MD;  Location: [place];  Service: Ophthalmology;  Laterality: Right;  . REFRACTIVE SURGERY Bilateral   . REMOVAL OF A DIALYSIS CATHETER Right 06/06/2013   Procedure: REMOVAL OF RIGHT MEDIPORT;  Surgeon: Conrad Morgan, MD;  Location: [place];  Service: Vascular;  Laterality: Right;  . TONSILLECTOMY    . UPPER GASTROINTESTINAL ENDOSCOPY      Social History:  reports that she has never smoked. She has never used smokeless tobacco. She reports that she does not drink alcohol or use drugs.  Family History:  Family  History  Problem Relation Age of Onset  . Diabetes Mother   . Hyperlipidemia Mother   . Hypertension Mother   . Hypertension Father   . Diabetes Sister   . Diabetes Brother   . Hypertension Brother   . Heart attack Brother   . Kidney disease Brother   . Colon cancer Neg Hx   . Colon polyps Neg Hx   . Esophageal cancer Neg Hx   . Gallbladder disease Neg Hx   . Rectal cancer Neg Hx   . Stomach cancer Neg Hx      Prior to Admission medications   Medication Sig Start Date End Date Taking? Authorizing Provider  acetaminophen (TYLENOL) 500 MG tablet Take 500 mg by mouth 2 (two) times daily.    [provider]  albuterol (PROVENTIL) (2.5 MG/3ML) 0.083% nebulizer solution Take 3 mLs (2.5 mg total) by nebulization every 6 (six) hours as needed for wheezing or shortness of breath. 10/30/16   Theodis Blaze, MD  amLODipine (NORVASC) 10 MG tablet Take 10 mg at bedtime by mouth. 06/02/17   [provider]  aspirin 81 MG chewable tablet Chew 81  mg by mouth daily with lunch.     [provider]  atropine 1 % ophthalmic solution Place 1 drop into the right eye 2 (two) times daily. 07/04/17   [provider]  B Complex-C-Folic Acid (RENA-VITE RX) 1 MG TABS Take 1 tablet by mouth daily with lunch.  09/29/16   [provider]  carvedilol (COREG) 12.5 MG tablet Take 12.5 mg by mouth 2 (two) times daily with a meal.    [provider]  COMBIGAN 0.2-0.5 % ophthalmic solution Place 1 drop into the right eye every 12 (twelve) hours.  10/23/17   [provider]  darbepoetin (ARANESP) 200 MCG/0.4ML SOLN injection Inject 0.4 mLs (200 mcg total) into the vein every Wednesday with hemodialysis. 06/22/13   Geradine Girt, DO  diphenhydrAMINE (BENADRYL) 25 MG tablet Take 12.5 mg by mouth every Monday, Wednesday, and Friday.    [provider]  docusate sodium (COLACE) 100 MG capsule Take 100 mg by mouth 2 (two) times daily.     [provider]  doxercalciferol (HECTOROL) 4 MCG/2ML injection Inject 0.5 mLs (1 mcg total) into the vein every Monday, Wednesday, and Friday with hemodialysis. 06/18/13   Geradine Girt, DO  fluticasone (FLONASE) 50 MCG/ACT nasal spray Place 1-2 sprays into both nostrils daily as needed for allergies or rhinitis.     [provider]  gabapentin (NEURONTIN) 100 MG capsule Take 100 mg by mouth 2 (two) times daily.    [provider]  insulin aspart (NOVOLOG FLEXPEN) 100 UNIT/ML FlexPen Inject 10-16 Units into the skin See admin instructions. Three times daily per Sliding scale    [provider]  lanthanum (FOSRENOL) 1000 MG chewable tablet Chew 2,000 mg by mouth 3 (three) times daily with meals. Take 1 tablet with snack    [provider]  lisinopril (PRINIVIL,ZESTRIL) 40 MG tablet Take 40 mg by mouth at bedtime.     [provider]  omeprazole (PRILOSEC) 40 MG capsule Take 1 capsule (40 mg total) by mouth daily. 02/09/18   Cherene Altes, MD  [facility] VERIO test strip 3 (three) times daily. for testing 12/15/17   [provider]  prednisoLONE acetate (PRED FORTE) 1 % ophthalmic suspension Place 1 drop into the right eye 2 (two) times daily.  04/27/17   [provider]    Physical Exam: Vitals:   05/07/18 0230 05/07/18 0245 05/07/18 0300 05/07/18 0315  BP: (!) 158/65  (!) 142/59   Pulse: 82 80 84 87  Resp: (!) 22 19 17 18   Temp:      TempSrc:      SpO2: 94% 97% 91% 95%  Weight:      Height:       General: Not in acute distress HEENT:       Eyes: has blindness. no scleral icterus.       ENT: No discharge from the ears and nose, no pharynx injection, no tonsillar enlargement.        Neck: No JVD, no bruit, no mass felt. Heme: No neck lymph node enlargement. Cardiac: S1/S2, RRR, No murmurs, No gallops or rubs. Respiratory: No rales, wheezing, rhonchi or rubs. GI: Soft, nondistended, has mild tenderness in epigastric area. no rebound  pain, no organomegaly, BS present. GU: No hematuria Ext: Trace leg edema bilaterally. 2+DP/PT pulse bilaterally. Musculoskeletal: No joint deformities, No joint redness or warmth, no limitation of ROM in spin. Skin: No rashes.  Neuro: Alert, oriented X3, cranial nerves IV-XII  grossly intact, moves all extremities normally. Psych: Patient is not psychotic, no suicidal or hemocidal ideation.  Labs on Admission: I have personally reviewed following labs and imaging studies  CBC: Recent Labs  Lab 05/06/18 1941  WBC 7.8  NEUTROABS 4.4  HGB 9.4*  HCT 30.3*  MCV 92.9  PLT 884   Basic Metabolic Panel: Recent Labs  Lab 05/06/18 1941  NA 138  K 4.9  CL 97*  CO2 25  GLUCOSE 163*  BUN 32*  CREATININE 8.81*  CALCIUM 9.4   GFR: Estimated Creatinine Clearance: 7.2 mL/min (A) (by C-G formula based on SCr of 8.81 mg/dL (H)). Liver Function Tests: Recent Labs  Lab 05/07/18 0201  AST 23  ALT 20  ALKPHOS 66  BILITOT 0.7  PROT 6.8  ALBUMIN 3.1*   Recent Labs  Lab 05/07/18 0201  LIPASE 45   No results for input(s): AMMONIA in the last 168 hours. Coagulation Profile: Recent Labs  Lab 05/07/18 0201  INR 1.16   Cardiac Enzymes: No results for input(s): CKTOTAL, CKMB, CKMBINDEX, TROPONINI in the last 168 hours. BNP (last 3 results) No results for input(s): PROBNP in the last 8760 hours. HbA1C: No results for input(s): HGBA1C in the last 72 hours. CBG: Recent Labs  Lab 05/07/18 0402  GLUCAP 111*   Lipid Profile: No results for input(s): CHOL, HDL, LDLCALC, TRIG, CHOLHDL, LDLDIRECT in the last 72 hours. Thyroid Function Tests: No results for input(s): TSH, T4TOTAL, FREET4, T3FREE, THYROIDAB in the last 72 hours. Anemia Panel: No results for input(s): VITAMINB12, FOLATE, FERRITIN, TIBC, IRON, RETICCTPCT in the last 72 hours. Urine analysis:    Component Value Date/Time   COLORURINE YELLOW 02/06/2018 1709   APPEARANCEUR HAZY (A) 02/06/2018 1709   LABSPEC 1.013  02/06/2018 1709   LABSPEC 1.025 02/13/2011 1533   PHURINE 8.0 02/06/2018 1709   GLUCOSEU 50 (A) 02/06/2018 1709   HGBUR NEGATIVE 02/06/2018 1709   BILIRUBINUR NEGATIVE 02/06/2018 1709   BILIRUBINUR Negative 02/13/2011 1533   KETONESUR NEGATIVE 02/06/2018 1709   PROTEINUR 100 (A) 02/06/2018 1709   UROBILINOGEN 0.2 04/28/2011 1045   NITRITE NEGATIVE 02/06/2018 1709   LEUKOCYTESUR NEGATIVE 02/06/2018 1709   LEUKOCYTESUR Negative 02/13/2011 1533   Sepsis Labs: @LABRCNTIP (procalcitonin:4,lacticidven:4) )No results found for this or any previous visit (from the past 240 hour(s)).   Radiological Exams on Admission: Dg Chest 2 View  Result Date: 05/06/2018 CLINICAL DATA:  Chest pain, cough, sore throat, and chills. EXAM: CHEST - 2 VIEW COMPARISON:  02/06/2018 FINDINGS: The cardiac silhouette remains enlarged. Aortic atherosclerosis is noted. Mild pulmonary vascular congestion is similar to the prior study. No overt edema, airspace consolidation, pleural effusion, pneumothorax is identified. No acute osseous abnormality is seen. IMPRESSION: Cardiomegaly and mild pulmonary vascular congestion. Electronically Signed   By: Logan Bores M.D.   On: 05/06/2018 20:29   Ct Chest Wo Contrast  Result Date: 05/07/2018 CLINICAL DATA:  Hemoptysis.  Cough. EXAM: CT CHEST WITHOUT CONTRAST TECHNIQUE: Multidetector CT imaging of the chest was performed following the standard protocol without IV contrast. COMPARISON:  Radiographs yesterday.  Chest CT 10/28/2016 FINDINGS: Cardiovascular: Dense aortic atherosclerosis. Coronary artery calcifications versus stents. Mild cardiomegaly with mitral annulus calcifications. Small volume pericardial fluid dependently, similar to prior exam. Mediastinum/Nodes: Shotty mediastinal lymph nodes all subcentimeter in short axis. Previous right thyroid nodule not well seen on the current exam. Limited assessment for hilar adenopathy given lack of IV contrast. Minimal distal esophageal  wall thickening. Lungs/Pleura: Smooth septal thickening with mild diffuse ground-glass  opacities suggesting pulmonary edema. Nodular and ground-glass opacity in the left upper lobe, with scattered smaller ill-defined nodular opacities in the left lower lobe. 4 mm right upper lobe pulmonary nodule. No significant pleural effusion. Mild central bronchial thickening. Trachea mainstem bronchi are otherwise patent. Upper Abdomen: High-density material in the colon, ingested material may be enteric contrast or other ingested material. Musculoskeletal: There are no acute or suspicious osseous abnormalities. Degenerative change of the right shoulder.Coarse left breast calcifications, typically benign. IMPRESSION: 1. Septal thickening and ground-glass opacities consistent with pulmonary edema. Cardiomegaly appears similar to prior exam. 2. Multifocal regions of ill-defined nodularity and slightly more patchy opacity, most prominent in the left upper lobe, may be superimposed infectious or inflammatory process. Pulmonary nodules are new from prior exam in presumed inflammatory, however recommend follow-up CT in 12 months. 3. Aortic atherosclerosis and coronary artery calcifications. Mitral annulus calcifications. Mild distal esophageal wall thickening can be seen with reflux or esophagitis. Aortic Atherosclerosis (ICD10-I70.0). Electronically Signed   By: Keith Rake M.D.   On: 05/07/2018 04:06     EKG:  Not done in ED, will get one.   Assessment/Plan Principal Problem:   Hemoptysis Active Problems:   Type II diabetes mellitus with renal manifestations (HCC)   Essential hypertension   GERD (gastroesophageal reflux disease)   ESRD on dialysis (HCC)   Blindness of both eyes   Chronic combined systolic and diastolic CHF (congestive heart failure) (H[facility])   Abdominal discomfort, epigastric   Anemia in ESRD (end-stage renal disease) (H[facility])   Asthma   Hemoptysis: Etiology is not clear. D-dimer negative. Less  likely to have PE.  Since patient is still making urine, CT of chest without contrast were performed, which showed multifocal regions of ill-defined nodularity and patchy opacity, indicating possible atypical infection, though patient does not have fever or leukocytosis.  - will admit to tele bed as inpt - hold ASA - IV Rocephin and azithromycin were started in ED, will continue - Mucinex for cough  - prn Albuterol Nebs, for SOB - Urine legionella and S. pneumococcal antigen - Follow up blood culture x2, sputum culture and respiratory virus panel - May need to consult pulmonologist in the morning  ESRD-HD (MWF): Had dialysis on Wednesday. potassium 4.9, bicarbonate 25, BUN 32, creatinine 8.81 -please call renal for HD in AM  Chronic combined systolic and diastolic CHF: 2D echo on 01/09/2978 showed EF of 45- 50% with grade 2 diastolic dysfunction.  Patient does not have respiratory distress, but her chest x-ray showed cardiomegaly and mild vascular congestion. -Volume management per renal  Type II diabetes mellitus with renal manifestations ([place]): Last A1c 5.9 on 11/24/17, well controled. Patient is taking NovoLog at home -SSI  Essential hypertension: -IV Hydralazine prn -Continue home medications: Amlodipine, Coreg, lisinopril  GERD: -Protonix  Abdominal discomfort, epigastric: Lipase normal, no acute abdomen on physical examination.  Likely due to history of gastric ulcer disease and GERD. -Continue Protonix  Anemia in ESRD (end-stage renal disease) ([place]): Hgb 10.4-->9.4, likely due to hemoptysis. -Follow-up with CBC  Asthma: Stable. -Bronchodilators as above   Inpatient status:  # Patient requires inpatient status due to high intensity of service, high risk for further deterioration and high frequency of surveillance required.  I certify that at the point of admission it is my clinical judgment that the patient will require inpatient hospital care spanning beyond 2 midnights  from the point of admission.  . This patient has multiple chronic comorbidities including hypertension, diabetes mellitus, asthma, GERD,  anxiety, ESRD-HD (MWF), bilateral eye blindness, breast cancer, GI bleeding, CHF with EF of 45%. . Now patient has presenting symptoms include hemoptysis and mild SOB . The worrisome physical exam findings include coughing up blood . The initial radiographic and laboratory data are worrisome because of hemoglobin dropping, abnormal CT scan of chest as above. . Current medical needs: please see my assessment and plan      DVT ppx: SCD Code Status: Full code Family Communication:    Yes, patient's sister   at bed side Disposition Plan:  Anticipate discharge back to previous home environment Consults called:  none Admission status:   SDU/inpation       Date of Service 05/07/2018    Ivor Costa [facility] Pager [phone]  If 7PM-7AM, please contact night-coverage [email or web address] Password [email or web address] 05/07/2018, 5:51 AM

## 2018-05-06 NOTE — ED Triage Notes (Signed)
Pt reports coughing up blood clots and some mucous for the last two days. Pt reports sore throat and chills. Pt is dialysis pt, MWF. Denies pain, N/V/D. XR done at doctors office.

## 2018-05-06 NOTE — Progress Notes (Signed)
Subjective: 67 y.o. year old female patient presents complaining of painful nails. Patient requests toe nails calluses trimmed.   Patientis blinded and did not have eye surgery last December. Her last A1c was 6.6  Disability with cancer in breast. Got on Dialysis since cancer treatment, 2014. Diabetic since 1985 and not controlled.  Objective: Dermatologic:Thick yellow deformed nails x 10. Vascular:Pedal pulses are all palpable. Orthopedic:Severe hallux valgus with bunion deformity bilateral. Contracted digit with digital corn 2nd right. Flat medial arch bilateral. Neurologic:All epicritic and tactile sensations grossly intact.   Assessment: Dystrophic mycotic nails x 10. Severe HAV with bunion bilateral. Pain in both feet.  Uncontrolled diabetic.  Treatment: All mycotic nails debrided.  Return as needed.

## 2018-05-06 NOTE — ED Provider Notes (Addendum)
Patient placed in Quick Look pathway, seen and evaluated   Chief Complaint: Hemoptysis  HPI:   67 year old female presents with cough with hemoptysis. PMH significant for visual impairment, ESRD (M, W, F - last went yesterday). She has been coughing for the past week but is unsure of when the hemoptysis started because she can't see. Her sister says she only noticed it today. She went to her PCP today and had blood work done and a CXR. Her hgb was stable (~10) and her CXR showed a left sided infiltrate but they wanted her to come to the ED because she was coughing up clots.  ROS: +cough, hemoptysis  Physical Exam:   Gen: No distress. Wearing sunglasses  Neuro: Awake and Alert  Skin: Warm    Focused Exam: Heart: +Murmur. Normal rate and rhythm    Lungs: CTA. Dime sized clot was coughed up   Initiation of care has begun. The patient has been counseled on the process, plan, and necessity for staying for the completion/evaluation, and the remainder of the medical screening examination    Recardo Evangelist, PA-C 05/06/18 1930    Recardo Evangelist, PA-C 05/06/18 1931    Isla Pence, MD 05/06/18 1940

## 2018-05-06 NOTE — ED Provider Notes (Addendum)
[facility] EMERGENCY DEPARTMENT Provider Note   CSN: [id number] Arrival date & time: 05/06/18  1833     History   Chief Complaint Chief Complaint  Patient presents with  . Hemoptysis    HPI [name] is a 67 y.o. female.  The history is provided by the patient and medical records. No language interpreter was used.  Cough  This is a new problem. The current episode started more than 2 days ago. The problem occurs constantly. The problem has not changed since onset.The cough is productive of sputum. The maximum temperature recorded prior to her arrival was 100 to 100.9 F. The fever has been present for less than 1 day. Associated symptoms include chest pain, chills, rhinorrhea and shortness of breath. Pertinent negatives include no headaches and no wheezing. She has tried nothing for the symptoms. Her past medical history is significant for pneumonia (from pcp today).    Past Medical History:  Diagnosis Date  . Anemia   . Anxiety   . Arthritis    "joints" (06/15/2013)  . Asthma   . 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 Blood transfusion without reported diagnosis   . Breast cancer ([place])    left  . CHF (congestive heart failure) ([place])   . 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 Esophageal ulcer with bleeding   . ESRD (end stage renal disease) ([place])    "suppose to start dialysis today" (06/15/2013)  . GERD (gastroesophageal reflux disease)   . Glaucoma    blind in both eyes  . Heart murmur   . Hx of adenomatous colonic polyps 04/07/2018  . Hypertension   . Myalgia 12/31/2011  . Neuropathy 12/31/2011  . Shortness of breath    "when she doesn't go to dialysis"  . Type II diabetes mellitus [facility])     Patient Active Problem List   Diagnosis Date Noted  . Hx of adenomatous colonic polyps 04/07/2018  . Status post carpal tunnel release 12/10/2017  . Carpal tunnel syndrome, left upper limb 11/10/2017  . Carpal tunnel  syndrome, right upper limb 11/10/2017  . Bilateral hand numbness 09/28/2017  . Hypertension 07/06/2017  . Chronic combined systolic and diastolic CHF (congestive heart failure) ([place]) 07/06/2017  . Complicated migraine 32/95/1884  . ESRD on dialysis ([place]) 06/04/2017  . Blindness of both eyes 06/04/2017  . Glaucoma 06/04/2017  . Essential hypertension 10/27/2016  . GERD (gastroesophageal reflux disease) 10/27/2016  . Poor venous access 02/08/2015  . Type II diabetes mellitus with renal manifestations ([place]) 06/15/2013  . Chronic kidney disease (CKD), stage IV (severe) ([place]) 06/03/2013  . Neuropathy 12/31/2011  . Breast cancer of upper-inner quadrant of left female breast ([place]) 06/27/2011    Past Surgical History:  Procedure Laterality Date  . ABDOMINAL HYSTERECTOMY     partial  . AV FISTULA PLACEMENT Right 06/06/2013   Procedure: ARTERIOVENOUS (AV) FISTULA CREATION-RIGHT BRACHIAL CEPHALIC;  Surgeon: Conrad Montrose, MD;  Location: [facility];  Service: Vascular;  Laterality: Right;  . BREAST BIOPSY Left   . BREAST LUMPECTOMY Left    "and took out some lymph nodes" (06/15/2013)  . CARPAL TUNNEL RELEASE Left 11/26/2017   Procedure: LEFT CARPAL TUNNEL RELEASE;  Surgeon: [name], MD;  Location: [facility];  Service: Orthopedics;  Laterality: Left;  . CATARACT EXTRACTION W/ ANTERIOR VITRECTOMY Bilateral   . CESAREAN SECTION  1980  . COLONOSCOPY    . ESOPHAGOGASTRODUODENOSCOPY N/A 07/08/2017  Procedure: ESOPHAGOGASTRODUODENOSCOPY (EGD);  Surgeon: [name], MD;  Location: [facility] ENDOSCOPY;  Service: Endoscopy;  Laterality: N/A;  . ESOPHAGOGASTRODUODENOSCOPY (EGD) WITH PROPOFOL N/A 02/07/2018   Procedure: ESOPHAGOGASTRODUODENOSCOPY (EGD) WITH PROPOFOL;  Surgeon: [name], MD;  Location: [facility] ENDOSCOPY;  Service: Gastroenterology;  Laterality: N/A;  APC and clips placed  . EYE SURGERY Bilateral    laser surgery  . PARS PLANA VITRECTOMY Right 05/05/2017   Procedure: PARS PLANA  VITRECTOMY WITH 25 GAUGE; PARTIAL REMOVAL OF OIL; INFERIOR PERIPHERAL IRIDECTOMY, REFORM ANTERIOR CHAMBER RIGHT EYE;  Surgeon: [name], MD;  Location: [facility];  Service: Ophthalmology;  Laterality: Right;  . REFRACTIVE SURGERY Bilateral   . REMOVAL OF A DIALYSIS CATHETER Right 06/06/2013   Procedure: REMOVAL OF RIGHT MEDIPORT;  Surgeon: Conrad , MD;  Location: [facility];  Service: Vascular;  Laterality: Right;  . TONSILLECTOMY    . UPPER GASTROINTESTINAL ENDOSCOPY       OB History   None      Home Medications    Prior to Admission medications   Medication Sig Start Date End Date Taking? Authorizing Provider  acetaminophen (TYLENOL) 500 MG tablet Take 500 mg by mouth 2 (two) times daily.    [provider]  albuterol (PROVENTIL) (2.5 MG/3ML) 0.083% nebulizer solution Take 3 mLs (2.5 mg total) by nebulization every 6 (six) hours as needed for wheezing or shortness of breath. 10/30/16   Theodis Blaze, MD  amLODipine (NORVASC) 10 MG tablet Take 10 mg at bedtime by mouth. 06/02/17   [provider]  aspirin 81 MG chewable tablet Chew 81 mg by mouth daily with lunch.     [provider]  atropine 1 % ophthalmic solution Place 1 drop into the right eye 2 (two) times daily. 07/04/17   [provider]  B Complex-C-Folic Acid (RENA-VITE RX) 1 MG TABS Take 1 tablet by mouth daily with lunch.  09/29/16   [provider]  carvedilol (COREG) 12.5 MG tablet Take 12.5 mg by mouth 2 (two) times daily with a meal.    [provider]  COMBIGAN 0.2-0.5 % ophthalmic solution Place 1 drop into the right eye every 12 (twelve) hours.  10/23/17   [provider]  darbepoetin (ARANESP) 200 MCG/0.4ML SOLN injection Inject 0.4 mLs (200 mcg total) into the vein every Wednesday with hemodialysis. 06/22/13   Geradine Girt, DO  diphenhydrAMINE (BENADRYL) 25 MG tablet Take 12.5 mg by mouth every Monday, Wednesday, and Friday.    [provider]    docusate sodium (COLACE) 100 MG capsule Take 100 mg by mouth 2 (two) times daily.     [provider]  doxercalciferol (HECTOROL) 4 MCG/2ML injection Inject 0.5 mLs (1 mcg total) into the vein every Monday, Wednesday, and Friday with hemodialysis. 06/18/13   Geradine Girt, DO  fluticasone (FLONASE) 50 MCG/ACT nasal spray Place 1-2 sprays into both nostrils daily as needed for allergies or rhinitis.     [provider]  gabapentin (NEURONTIN) 100 MG capsule Take 100 mg by mouth 2 (two) times daily.    [provider]  insulin aspart (NOVOLOG FLEXPEN) 100 UNIT/ML FlexPen Inject 10-16 Units into the skin See admin instructions. Three times daily per Sliding scale    [provider]  lanthanum (FOSRENOL) 1000 MG chewable tablet Chew 2,000 mg by mouth 3 (three) times daily with meals. Take 1 tablet with snack    [provider]  lisinopril (PRINIVIL,ZESTRIL) 40 MG tablet  Take 40 mg by mouth at bedtime.     [provider]  omeprazole (PRILOSEC) 40 MG capsule Take 1 capsule (40 mg total) by mouth daily. 02/09/18   Cherene Altes, MD  [facility] VERIO test strip 3 (three) times daily. for testing 12/15/17   [provider]  prednisoLONE acetate (PRED FORTE) 1 % ophthalmic suspension Place 1 drop into the right eye 2 (two) times daily.  04/27/17   [provider]    Family History Family History  Problem Relation Age of Onset  . Diabetes Mother   . Hyperlipidemia Mother   . Hypertension Mother   . Hypertension Father   . Diabetes Sister   . Diabetes Brother   . Hypertension Brother   . Heart attack Brother   . Kidney disease Brother   . Colon cancer Neg Hx   . Colon polyps Neg Hx   . Esophageal cancer Neg Hx   . Gallbladder disease Neg Hx   . Rectal cancer Neg Hx   . Stomach cancer Neg Hx     Social History Social History   Tobacco Use  . Smoking status: Never Smoker  . Smokeless tobacco: Never Used  Substance  Use Topics  . Alcohol use: No  . Drug use: No     Allergies   Latex; Oxycodone; Tramadol; Tape; and Vicodin [hydrocodone-acetaminophen]   Review of Systems Review of Systems  Constitutional: Positive for chills, diaphoresis and fatigue. Negative for fever.  HENT: Positive for congestion and rhinorrhea.   Respiratory: Positive for cough, chest tightness and shortness of breath. Negative for wheezing and stridor.   Cardiovascular: Positive for chest pain. Negative for palpitations and leg swelling.  Gastrointestinal: Negative for abdominal pain, constipation, diarrhea and nausea.  Genitourinary: Negative for flank pain.  Musculoskeletal: Negative for back pain, neck pain and neck stiffness.  Skin: Negative for rash and wound.  Neurological: Negative for light-headedness and headaches.  Psychiatric/Behavioral: Negative for agitation.  All other systems reviewed and are negative.    Physical Exam Updated Vital Signs BP (!) 161/73 (BP Location: Left Arm)   Pulse 88   Temp 98.9 F (37.2 C) (Oral)   Resp 16   Ht 5\' 5"  (1.651 m)   Wt 93 kg   SpO2 92%   BMI 34.11 kg/m   Physical Exam  Constitutional: She is oriented to person, place, and time. She appears well-developed and well-nourished. No distress.  HENT:  Head: Normocephalic and atraumatic.  Right Ear: External ear normal.  Left Ear: External ear normal.  Nose: Nose normal.  Mouth/Throat: Oropharynx is clear and moist. No oropharyngeal exudate.  Eyes: Conjunctivae and EOM are normal.  Neck: Normal range of motion. Neck supple.  Cardiovascular: Intact distal pulses. Tachycardia present.  No murmur heard. Pulmonary/Chest: No stridor. Tachypnea noted. No respiratory distress. She has no wheezes. She has rhonchi. She has no rales. She exhibits no tenderness.  Abdominal: She exhibits no distension. There is no tenderness. There is no rebound.  Musculoskeletal: She exhibits no tenderness.  Neurological: She is alert and  oriented to person, place, and time. She has normal reflexes. No sensory deficit. She exhibits normal muscle tone. Coordination normal.  Skin: Skin is warm. No rash noted. She is not diaphoretic. No erythema.  Psychiatric: She has a normal mood and affect.  Nursing note and vitals reviewed.    ED Treatments / Results  Labs (all labs ordered are listed, but only abnormal results are displayed) Labs Reviewed  CBC WITH  DIFFERENTIAL/PLATELET - Abnormal; Notable for the following components:      Result Value   RBC 3.26 (*)    Hemoglobin 9.4 (*)    HCT 30.3 (*)    RDW 15.9 (*)    All other components within normal limits  BASIC METABOLIC PANEL - Abnormal; Notable for the following components:   Chloride 97 (*)    Glucose, Bld 163 (*)    BUN 32 (*)    Creatinine, Ser 8.81 (*)    GFR calc non Af Amer 4 (*)    GFR calc Af Amer 5 (*)    Anion gap 16 (*)    All other components within normal limits  CULTURE, BLOOD (ROUTINE X 2)  CULTURE, BLOOD (ROUTINE X 2)  EXPECTORATED SPUTUM ASSESSMENT W REFEX TO RESP CULTURE  GRAM STAIN  RESPIRATORY PANEL BY PCR  D-DIMER, QUANTITATIVE (NOT AT [facility])  HIV ANTIBODY (ROUTINE TESTING W REFLEX)  STREP PNEUMONIAE URINARY ANTIGEN  PROTIME-INR  APTT  LIPASE, BLOOD  HEPATIC FUNCTION PANEL    EKG None  Radiology Dg Chest 2 View  Result Date: 05/06/2018 CLINICAL DATA:  Chest pain, cough, sore throat, and chills. EXAM: CHEST - 2 VIEW COMPARISON:  02/06/2018 FINDINGS: The cardiac silhouette remains enlarged. Aortic atherosclerosis is noted. Mild pulmonary vascular congestion is similar to the prior study. No overt edema, airspace consolidation, pleural effusion, pneumothorax is identified. No acute osseous abnormality is seen. IMPRESSION: Cardiomegaly and mild pulmonary vascular congestion. Electronically Signed   By: Logan Bores M.D.   On: 05/06/2018 20:29         Procedures Procedures (including critical care time)  CRITICAL CARE Performed  by: Gwenyth Allegra Rebeccah Ivins Total critical care time: 35 minutes Critical care time was exclusive of separately billable procedures and treating other patients. Critical care was necessary to treat or prevent imminent or life-threatening deterioration. Critical care was time spent personally by me on the following activities: development of treatment plan with patient and/or surrogate as well as nursing, discussions with consultants, evaluation of patient's response to treatment, examination of patient, obtaining history from patient or surrogate, ordering and performing treatments and interventions, ordering and review of laboratory studies, ordering and review of radiographic studies, pulse oximetry and re-evaluation of patient's condition.   Medications Ordered in ED Medications  dextromethorphan-guaiFENesin (MUCINEX DM) 30-600 MG per 12 hr tablet 1 tablet (has no administration in time range)  cefTRIAXone (ROCEPHIN) 1 g in sodium chloride 0.9 % 100 mL IVPB (0 g Intravenous Stopped 05/07/18 0016)  azithromycin (ZITHROMAX) 500 mg in sodium chloride 0.9 % 250 mL IVPB (0 mg Intravenous Stopped 05/07/18 0040)     Initial Impression / Assessment and Plan / ED Course  I have reviewed the triage vital signs and the nursing notes.  Pertinent labs & imaging results that were available during my care of the patient were reviewed by me and considered in my medical decision making (see chart for details).     [name] is a 67 y.o. female with a history of blindness, diabetes, hypertension, CHF, ESRD on dialysis, and prior GI bleeding who presents from her PCP for fever, x-ray showing pneumonia, hemoptysis, abdominal pain, chest pain, and fatigue.  Patient reports that her symptoms been ongoing for the last few days.  She reports that she had a fever at home the recent loss.  X-ray at PCP shows evidence of pneumonia.  Given the patient's history of anemia, the hemoptysis, and her new pneumonia,  patient was sent  to the ED for evaluation and likely admission.  Patient reports she has been taking her dialysis as normal.  She reports that she has been coughing up sputum all week but until today was not told it was bloody.  She reports some epigastric discomfort although she denies any vomiting blood.  She reports a history of GI bleeds requiring multiple transfusions.  On exam, patient had some rhonchi.  No wheezing.  Chest was nontender and epigastrium was slightly tender.  Patient had a repeat blood work during a drop in her hemoglobin from 10.4 this afternoon to 9.4.  This is concerning given the hemoptysis.  X-ray was repeated and showed congestion however no large consolidation was seen.  Clinically however, given her x-ray showing pneumonia, her symptoms, patient will be treated for pneumonia.  Patient admitted for further management of pneumonia and for further monitoring of hemoglobin that is downtrending in the setting of hemoptysis.  If patient has hematemesis or GI bleeding, would likely recommend speaking with gastroenterology given her history of GI bleeding requiring intervention.  She will likely need dialysis in the morning.     Final Clinical Impressions(s) / ED Diagnoses   Final diagnoses:  Community acquired pneumonia of left lung, unspecified part of lung  Hemoptysis  Anemia, unspecified type    ED Discharge Orders    None     Clinical Impression: 1. Community acquired pneumonia of left lung, unspecified part of lung   2. Hemoptysis   3. Anemia, unspecified type   4. Special screening for malignant neoplasms, colon     Disposition: Admit  This note was prepared with assistance of Dragon voice recognition software. Occasional wrong-word or sound-a-like substitutions may have occurred due to the inherent limitations of voice recognition software.     Khoen Genet, Gwenyth Allegra, MD 05/07/18 [id number]    Taimur Fier, Gwenyth Allegra, MD 05/17/18 [phone]

## 2018-05-06 NOTE — Patient Instructions (Signed)
Seen for hypertrophic nails. All nails debrided. Return as needed.  

## 2018-05-07 ENCOUNTER — Inpatient Hospital Stay (HOSPITAL_COMMUNITY): Payer: Medicare Other

## 2018-05-07 DIAGNOSIS — M1990 Unspecified osteoarthritis, unspecified site: Secondary | ICD-10-CM | POA: Diagnosis present

## 2018-05-07 DIAGNOSIS — D631 Anemia in chronic kidney disease: Secondary | ICD-10-CM | POA: Diagnosis not present

## 2018-05-07 DIAGNOSIS — H409 Unspecified glaucoma: Secondary | ICD-10-CM | POA: Diagnosis present

## 2018-05-07 DIAGNOSIS — I132 Hypertensive heart and chronic kidney disease with heart failure and with stage 5 chronic kidney disease, or end stage renal disease: Secondary | ICD-10-CM | POA: Diagnosis present

## 2018-05-07 DIAGNOSIS — Z9841 Cataract extraction status, right eye: Secondary | ICD-10-CM | POA: Diagnosis not present

## 2018-05-07 DIAGNOSIS — Z86010 Personal history of colonic polyps: Secondary | ICD-10-CM | POA: Diagnosis not present

## 2018-05-07 DIAGNOSIS — N186 End stage renal disease: Secondary | ICD-10-CM | POA: Diagnosis not present

## 2018-05-07 DIAGNOSIS — N2581 Secondary hyperparathyroidism of renal origin: Secondary | ICD-10-CM | POA: Diagnosis not present

## 2018-05-07 DIAGNOSIS — J189 Pneumonia, unspecified organism: Secondary | ICD-10-CM | POA: Diagnosis present

## 2018-05-07 DIAGNOSIS — E1140 Type 2 diabetes mellitus with diabetic neuropathy, unspecified: Secondary | ICD-10-CM | POA: Diagnosis present

## 2018-05-07 DIAGNOSIS — I120 Hypertensive chronic kidney disease with stage 5 chronic kidney disease or end stage renal disease: Secondary | ICD-10-CM | POA: Diagnosis not present

## 2018-05-07 DIAGNOSIS — E1139 Type 2 diabetes mellitus with other diabetic ophthalmic complication: Secondary | ICD-10-CM | POA: Diagnosis present

## 2018-05-07 DIAGNOSIS — Z8249 Family history of ischemic heart disease and other diseases of the circulatory system: Secondary | ICD-10-CM | POA: Diagnosis not present

## 2018-05-07 DIAGNOSIS — Z833 Family history of diabetes mellitus: Secondary | ICD-10-CM | POA: Diagnosis not present

## 2018-05-07 DIAGNOSIS — R042 Hemoptysis: Secondary | ICD-10-CM | POA: Diagnosis not present

## 2018-05-07 DIAGNOSIS — E1122 Type 2 diabetes mellitus with diabetic chronic kidney disease: Secondary | ICD-10-CM | POA: Diagnosis not present

## 2018-05-07 DIAGNOSIS — K219 Gastro-esophageal reflux disease without esophagitis: Secondary | ICD-10-CM | POA: Diagnosis present

## 2018-05-07 DIAGNOSIS — Z992 Dependence on renal dialysis: Secondary | ICD-10-CM | POA: Diagnosis not present

## 2018-05-07 DIAGNOSIS — R918 Other nonspecific abnormal finding of lung field: Secondary | ICD-10-CM | POA: Diagnosis not present

## 2018-05-07 DIAGNOSIS — Z853 Personal history of malignant neoplasm of breast: Secondary | ICD-10-CM | POA: Diagnosis not present

## 2018-05-07 DIAGNOSIS — Z9842 Cataract extraction status, left eye: Secondary | ICD-10-CM | POA: Diagnosis not present

## 2018-05-07 DIAGNOSIS — I5042 Chronic combined systolic (congestive) and diastolic (congestive) heart failure: Secondary | ICD-10-CM | POA: Diagnosis present

## 2018-05-07 DIAGNOSIS — Z90710 Acquired absence of both cervix and uterus: Secondary | ICD-10-CM | POA: Diagnosis not present

## 2018-05-07 DIAGNOSIS — Z794 Long term (current) use of insulin: Secondary | ICD-10-CM | POA: Diagnosis not present

## 2018-05-07 DIAGNOSIS — Z8349 Family history of other endocrine, nutritional and metabolic diseases: Secondary | ICD-10-CM | POA: Diagnosis not present

## 2018-05-07 DIAGNOSIS — H543 Unqualified visual loss, both eyes: Secondary | ICD-10-CM | POA: Diagnosis present

## 2018-05-07 DIAGNOSIS — J45909 Unspecified asthma, uncomplicated: Secondary | ICD-10-CM | POA: Diagnosis present

## 2018-05-07 LAB — EXPECTORATED SPUTUM ASSESSMENT W GRAM STAIN, RFLX TO RESP C

## 2018-05-07 LAB — GLUCOSE, CAPILLARY
GLUCOSE-CAPILLARY: 161 mg/dL — AB (ref 70–99)
Glucose-Capillary: 205 mg/dL — ABNORMAL HIGH (ref 70–99)

## 2018-05-07 LAB — RENAL FUNCTION PANEL
ALBUMIN: 3.1 g/dL — AB (ref 3.5–5.0)
Anion gap: 13 (ref 5–15)
BUN: 46 mg/dL — AB (ref 8–23)
CO2: 23 mmol/L (ref 22–32)
CREATININE: 11.19 mg/dL — AB (ref 0.44–1.00)
Calcium: 8.7 mg/dL — ABNORMAL LOW (ref 8.9–10.3)
Chloride: 99 mmol/L (ref 98–111)
GFR calc Af Amer: 4 mL/min — ABNORMAL LOW (ref >60)
GFR, EST NON AFRICAN AMERICAN: 3 mL/min — AB (ref >60)
Glucose, Bld: 136 mg/dL — ABNORMAL HIGH (ref 70–99)
Phosphorus: 6.8 mg/dL — ABNORMAL HIGH (ref 2.5–4.6)
Potassium: 5 mmol/L (ref 3.5–5.1)
Sodium: 135 mmol/L (ref 135–145)

## 2018-05-07 LAB — RESPIRATORY PANEL BY PCR
Adenovirus: NOT DETECTED
BORDETELLA PERTUSSIS-RVPCR: NOT DETECTED
CHLAMYDOPHILA PNEUMONIAE-RVPPCR: NOT DETECTED
Coronavirus 229E: NOT DETECTED
Coronavirus HKU1: NOT DETECTED
Coronavirus NL63: NOT DETECTED
Coronavirus OC43: NOT DETECTED
Influenza A: NOT DETECTED
Influenza B: NOT DETECTED
Metapneumovirus: NOT DETECTED
Mycoplasma pneumoniae: NOT DETECTED
PARAINFLUENZA VIRUS 3-RVPPCR: NOT DETECTED
PARAINFLUENZA VIRUS 4-RVPPCR: NOT DETECTED
Parainfluenza Virus 1: NOT DETECTED
Parainfluenza Virus 2: NOT DETECTED
RHINOVIRUS / ENTEROVIRUS - RVPPCR: NOT DETECTED
Respiratory Syncytial Virus: NOT DETECTED

## 2018-05-07 LAB — HEPATIC FUNCTION PANEL
ALBUMIN: 3.1 g/dL — AB (ref 3.5–5.0)
ALK PHOS: 66 U/L (ref 38–126)
ALT: 20 U/L (ref 0–44)
AST: 23 U/L (ref 15–41)
BILIRUBIN TOTAL: 0.7 mg/dL (ref 0.3–1.2)
Bilirubin, Direct: 0.1 mg/dL (ref 0.0–0.2)
Indirect Bilirubin: 0.6 mg/dL (ref 0.3–0.9)
TOTAL PROTEIN: 6.8 g/dL (ref 6.5–8.1)

## 2018-05-07 LAB — LIPASE, BLOOD: Lipase: 45 U/L (ref 11–51)

## 2018-05-07 LAB — CBC
HCT: 29.1 % — ABNORMAL LOW (ref 36.0–46.0)
Hemoglobin: 9 g/dL — ABNORMAL LOW (ref 12.0–15.0)
MCH: 29.2 pg (ref 26.0–34.0)
MCHC: 30.9 g/dL (ref 30.0–36.0)
MCV: 94.5 fL (ref 80.0–100.0)
NRBC: 0 % (ref 0.0–0.2)
PLATELETS: 202 10*3/uL (ref 150–400)
RBC: 3.08 MIL/uL — ABNORMAL LOW (ref 3.87–5.11)
RDW: 15.9 % — AB (ref 11.5–15.5)
WBC: 7.5 10*3/uL (ref 4.0–10.5)

## 2018-05-07 LAB — GRAM STAIN: GRAM STAIN: NONE SEEN

## 2018-05-07 LAB — HIV ANTIBODY (ROUTINE TESTING W REFLEX): HIV Screen 4th Generation wRfx: NONREACTIVE

## 2018-05-07 LAB — EXPECTORATED SPUTUM ASSESSMENT W REFEX TO RESP CULTURE

## 2018-05-07 LAB — CBG MONITORING, ED
GLUCOSE-CAPILLARY: 111 mg/dL — AB (ref 70–99)
Glucose-Capillary: 102 mg/dL — ABNORMAL HIGH (ref 70–99)

## 2018-05-07 LAB — D-DIMER, QUANTITATIVE: D-Dimer, Quant: 0.48 ug/mL-FEU (ref 0.00–0.50)

## 2018-05-07 LAB — STREP PNEUMONIAE URINARY ANTIGEN: STREP PNEUMO URINARY ANTIGEN: NEGATIVE

## 2018-05-07 LAB — MRSA PCR SCREENING: MRSA by PCR: NEGATIVE

## 2018-05-07 LAB — PROTIME-INR
INR: 1.16
PROTHROMBIN TIME: 14.7 s (ref 11.4–15.2)

## 2018-05-07 LAB — APTT: aPTT: 32 seconds (ref 24–36)

## 2018-05-07 MED ORDER — INSULIN ASPART 100 UNIT/ML ~~LOC~~ SOLN: 0.0000 [IU] | Freq: Every day | SUBCUTANEOUS | Status: DC

## 2018-05-07 MED ORDER — DM-GUAIFENESIN ER 30-600 MG PO TB12: 1.0000 | ORAL_TABLET | Freq: Two times a day (BID) | ORAL | Status: DC | PRN

## 2018-05-07 MED ORDER — ATROPINE SULFATE 1 % OP SOLN
1.0000 [drp] | Freq: Two times a day (BID) | OPHTHALMIC | Status: DC
  Administered 2018-05-08 (×2): 1 [drp] via OPHTHALMIC
  Filled 2018-05-07: qty 2

## 2018-05-07 MED ORDER — ALBUTEROL SULFATE (2.5 MG/3ML) 0.083% IN NEBU: 2.5000 mg | INHALATION_SOLUTION | RESPIRATORY_TRACT | Status: DC | PRN

## 2018-05-07 MED ORDER — LISINOPRIL 20 MG PO TABS
40.0000 mg | ORAL_TABLET | Freq: Every day | ORAL | Status: DC
  Administered 2018-05-07 – 2018-05-08 (×2): 40 mg via ORAL
  Filled 2018-05-07 (×2): qty 2

## 2018-05-07 MED ORDER — AMLODIPINE BESYLATE 10 MG PO TABS
10.0000 mg | ORAL_TABLET | Freq: Every day | ORAL | Status: DC
  Administered 2018-05-07 – 2018-05-08 (×2): 10 mg via ORAL
  Filled 2018-05-07: qty 2
  Filled 2018-05-07: qty 1

## 2018-05-07 MED ORDER — INSULIN ASPART 100 UNIT/ML ~~LOC~~ SOLN
0.0000 [IU] | Freq: Three times a day (TID) | SUBCUTANEOUS | Status: DC
  Administered 2018-05-07: 3 [IU] via SUBCUTANEOUS
  Administered 2018-05-07: 2 [IU] via SUBCUTANEOUS
  Administered 2018-05-08: 3 [IU] via SUBCUTANEOUS

## 2018-05-07 MED ORDER — PREDNISOLONE ACETATE 1 % OP SUSP
1.0000 [drp] | Freq: Two times a day (BID) | OPHTHALMIC | Status: DC
  Administered 2018-05-08 (×2): 1 [drp] via OPHTHALMIC
  Filled 2018-05-07: qty 5

## 2018-05-07 MED ORDER — PANTOPRAZOLE SODIUM 40 MG PO TBEC
40.0000 mg | DELAYED_RELEASE_TABLET | Freq: Every day | ORAL | Status: DC
  Administered 2018-05-07 – 2018-05-08 (×2): 40 mg via ORAL
  Filled 2018-05-07 (×2): qty 1

## 2018-05-07 MED ORDER — SODIUM CHLORIDE 0.9 % IV SOLN
1.0000 g | INTRAVENOUS | Status: DC
  Administered 2018-05-08: 1 g via INTRAVENOUS
  Filled 2018-05-07 (×2): qty 10

## 2018-05-07 MED ORDER — LANTHANUM CARBONATE 500 MG PO CHEW
2000.0000 mg | CHEWABLE_TABLET | Freq: Three times a day (TID) | ORAL | Status: DC
  Administered 2018-05-07 – 2018-05-08 (×3): 2000 mg via ORAL
  Filled 2018-05-07 (×4): qty 4

## 2018-05-07 MED ORDER — SODIUM CHLORIDE 0.9 % IV SOLN
500.0000 mg | INTRAVENOUS | Status: DC
  Administered 2018-05-07: 500 mg via INTRAVENOUS
  Filled 2018-05-07 (×2): qty 500

## 2018-05-07 MED ORDER — GABAPENTIN 100 MG PO CAPS
100.0000 mg | ORAL_CAPSULE | Freq: Two times a day (BID) | ORAL | Status: DC
  Administered 2018-05-07 – 2018-05-08 (×4): 100 mg via ORAL
  Filled 2018-05-07 (×4): qty 1

## 2018-05-07 MED ORDER — RENA-VITE PO TABS
1.0000 | ORAL_TABLET | Freq: Every day | ORAL | Status: DC
  Administered 2018-05-08: 1 via ORAL
  Filled 2018-05-07: qty 1

## 2018-05-07 MED ORDER — CHLORHEXIDINE GLUCONATE CLOTH 2 % EX PADS: 6.0000 | MEDICATED_PAD | Freq: Every day | CUTANEOUS | Status: DC

## 2018-05-07 MED ORDER — CARVEDILOL 25 MG PO TABS
25.0000 mg | ORAL_TABLET | Freq: Two times a day (BID) | ORAL | Status: DC
  Administered 2018-05-08 (×2): 25 mg via ORAL
  Filled 2018-05-07 (×4): qty 1

## 2018-05-07 NOTE — Progress Notes (Signed)
HD tx initiated via 15Gx2 w/o problem, pull/push/flush well w/o problem, VSS, will cont to monitor while on HD tx 

## 2018-05-07 NOTE — ED Notes (Signed)
Gave pt a [place] sandwich per Taylor-RN.

## 2018-05-07 NOTE — Progress Notes (Signed)
PROGRESS NOTE    ALIANNA Kane  KNL:[id number] DOB: 1950/11/13 DOA: 05/06/2018 PCP: Prince Solian, MD    Brief Narrative:  67 year old woman with past medical history relevant for hypertension, ESRD on Monday/Wednesday/Friday dialysis, type 2 diabetes not on insulin, chronic systolic and diastolic heart failure with EF of 45 to 50% with diffuse hypokinesis and grade 2 diastolic dysfunction by echo on 10/30/2016, reflux, history of GI bleeding due to possibly AVMs admitted with episodes of hemoptysis.   Assessment & Plan:   Principal Problem:   Hemoptysis Active Problems:   Type II diabetes mellitus with renal manifestations (HCC)   Essential hypertension   GERD (gastroesophageal reflux disease)   ESRD on dialysis ([place])   Blindness of both eyes   Chronic combined systolic and diastolic CHF (congestive heart failure) (HCC)   Abdominal discomfort, epigastric   Anemia in ESRD (end-stage renal disease) ([place])   Asthma   #) Hemoptysis/possible community acquired pneumonia: CT scan on admission showed evidence of both fluid overload and opacities concerning for community acquired pneumonia.  She herself reports only a cough for approximately 1 week as well as some blood clots.  She has no other evidence of a hemorrhage.  CTA PE was not performed however her d-dimer was normal which is quite unusual for somebody with end-stage renal disease. -We will recheck H&H -Continue IV ceftriaxone and azithromycin started 05/06/2018 -Obtain blood cultures 05/06/2018 -We will discuss with pulmonology if patient continues to have large volume hemoptysis  #) Type 2 diabetes: Patient currently is not on any treatment with this other than sliding scale -Sliding scale insulin  #) ESRD on M/W/F, gated by hypoproliferative anemia: Patient is on 2 L oxygen now which is due and has some evidence of heart failure on CT noted.  Additionally she has not been able to get dialysis today. -Consult  nephrology -Continue trigger point injections -Continue doxercalciferol injections Monday/Wednesday/Friday  #) Hypertension: -Continue amlodipine 10 mg daily -Hold aspirin 81 mg daily - Continue carvedilol 5 mg twice daily -Continue lisinopril 40 mg nightly  #) Pain/psych: -Continue gabapentin 100 mg twice daily  #) GERD: -Continue PPI  Fluids: Restricted Elect lites: Monitor and supplement Nutrition: Renal/carb restricted diet  Prophylaxis: Subcu heparin  Disposition: Pending treatment for possible pneumonia  Full code   Consultants:   Nephrology  Procedures:  None  Antimicrobials:   IV ceftriaxone and azithromycin started 05/06/2018   Subjective: Patient reports she is doing fairly well and is only quite hungry.  She denies any nausea, vomiting, diarrhea.  She denies any cough, congestion.  Objective: Vitals:   05/07/18 1030 05/07/18 1045 05/07/18 1100 05/07/18 1300  BP: (!) 148/61  (!) 146/65 (!) 160/85  Pulse: 80 78 81   Resp: 19 18 15  (!) 22  Temp:    98.7 F (37.1 C)  TempSrc:    Oral  SpO2: 93% 99% 94% 91%  Weight:      Height:        Intake/Output Summary (Last 24 hours) at 05/07/2018 1324 Last data filed at 05/07/2018 0040 Gross per 24 hour  Intake 350 ml  Output -  Net 350 ml   Filed Weights   05/06/18 1905  Weight: 93 kg    Examination:  General exam: Appears calm and comfortable  Respiratory system: No increased work of breathing, scattered rhonchi, no wheezes, crackles Cardiovascular system: Regular rate and rhythm, no murmurs Gastrointestinal system: Abdomen is nondistended, soft and nontender. No organomegaly or masses felt. Normal bowel sounds  heard. Central nervous system: Grossly intact, moving all extremities, noted to be blind. Extremities: Fistula site is clean dry and intact Skin: No rashes over visible skin Psychiatry: Judgement and insight appear normal. Mood & affect appropriate.     Data Reviewed: I have  personally reviewed following labs and imaging studies  CBC: Recent Labs  Lab 05/06/18 1941  WBC 7.8  NEUTROABS 4.4  HGB 9.4*  HCT 30.3*  MCV 92.9  PLT 703   Basic Metabolic Panel: Recent Labs  Lab 05/06/18 1941  NA 138  K 4.9  CL 97*  CO2 25  GLUCOSE 163*  BUN 32*  CREATININE 8.81*  CALCIUM 9.4   GFR: Estimated Creatinine Clearance: 7.2 mL/min (A) (by C-G formula based on SCr of 8.81 mg/dL (H)). Liver Function Tests: Recent Labs  Lab 05/07/18 0201  AST 23  ALT 20  ALKPHOS 66  BILITOT 0.7  PROT 6.8  ALBUMIN 3.1*   Recent Labs  Lab 05/07/18 0201  LIPASE 45   No results for input(s): AMMONIA in the last 168 hours. Coagulation Profile: Recent Labs  Lab 05/07/18 0201  INR 1.16   Cardiac Enzymes: No results for input(s): CKTOTAL, CKMB, CKMBINDEX, TROPONINI in the last 168 hours. BNP (last 3 results) No results for input(s): PROBNP in the last 8760 hours. HbA1C: No results for input(s): HGBA1C in the last 72 hours. CBG: Recent Labs  Lab 05/07/18 0402 05/07/18 0953 05/07/18 1304  GLUCAP 111* 102* 205*   Lipid Profile: No results for input(s): CHOL, HDL, LDLCALC, TRIG, CHOLHDL, LDLDIRECT in the last 72 hours. Thyroid Function Tests: No results for input(s): TSH, T4TOTAL, FREET4, T3FREE, THYROIDAB in the last 72 hours. Anemia Panel: No results for input(s): VITAMINB12, FOLATE, FERRITIN, TIBC, IRON, RETICCTPCT in the last 72 hours. Sepsis Labs: No results for input(s): PROCALCITON, LATICACIDVEN in the last 168 hours.  Recent Results (from the past 240 hour(s))  Culture, sputum-assessment     Status: None   Collection Time: 05/07/18  8:18 AM  Result Value Ref Range Status   Specimen Description SPUTUM  Final   Special Requests NONE  Final   Sputum evaluation   Final    Sputum specimen not acceptable for testing.  Please recollect.   Results Called to: A. MOUHAMAD, RN AT 1318 ON 05/07/18 BY C. JESSUP, MLT. Performed at [facility],  [place] [address], [place], [place] [address]    Report Status 05/07/2018 FINAL  Final  Gram stain     Status: None   Collection Time: 05/07/18  8:18 AM  Result Value Ref Range Status   Specimen Description URINE, RANDOM  Final   Special Requests NONE  Final   Gram Stain   Final    NO WBC SEEN GRAM POSITIVE RODS CYTOSPIN SMEAR Performed at [facility], [place] [address], [place], [place] [address]    Report Status 05/07/2018 FINAL  Final  Respiratory Panel by PCR     Status: None   Collection Time: 05/07/18  8:18 AM  Result Value Ref Range Status   Adenovirus NOT DETECTED NOT DETECTED Final   Coronavirus 229E NOT DETECTED NOT DETECTED Final   Coronavirus HKU1 NOT DETECTED NOT DETECTED Final   Coronavirus NL63 NOT DETECTED NOT DETECTED Final   Coronavirus OC43 NOT DETECTED NOT DETECTED Final   Metapneumovirus NOT DETECTED NOT DETECTED Final   Rhinovirus / Enterovirus NOT DETECTED NOT DETECTED Final   Influenza A NOT DETECTED NOT DETECTED Final   Influenza B NOT  DETECTED NOT DETECTED Final   Parainfluenza Virus 1 NOT DETECTED NOT DETECTED Final   Parainfluenza Virus 2 NOT DETECTED NOT DETECTED Final   Parainfluenza Virus 3 NOT DETECTED NOT DETECTED Final   Parainfluenza Virus 4 NOT DETECTED NOT DETECTED Final   Respiratory Syncytial Virus NOT DETECTED NOT DETECTED Final   Bordetella pertussis NOT DETECTED NOT DETECTED Final   Chlamydophila pneumoniae NOT DETECTED NOT DETECTED Final   Mycoplasma pneumoniae NOT DETECTED NOT DETECTED Final    Comment: Performed at [facility], [place] [address], [place], [place] [address]         Radiology Studies: Dg Chest 2 View  Result Date: 05/06/2018 CLINICAL DATA:  Chest pain, cough, sore throat, and chills. EXAM: CHEST - 2 VIEW COMPARISON:  02/06/2018 FINDINGS: The cardiac silhouette remains enlarged. Aortic atherosclerosis is noted. Mild pulmonary vascular congestion is similar to the prior study. No overt edema, airspace  consolidation, pleural effusion, pneumothorax is identified. No acute osseous abnormality is seen. IMPRESSION: Cardiomegaly and mild pulmonary vascular congestion. Electronically Signed   By: Logan Bores M.D.   On: 05/06/2018 20:29   Ct Chest Wo Contrast  Result Date: 05/07/2018 CLINICAL DATA:  Hemoptysis.  Cough. EXAM: CT CHEST WITHOUT CONTRAST TECHNIQUE: Multidetector CT imaging of the chest was performed following the standard protocol without IV contrast. COMPARISON:  Radiographs yesterday.  Chest CT 10/28/2016 FINDINGS: Cardiovascular: Dense aortic atherosclerosis. Coronary artery calcifications versus stents. Mild cardiomegaly with mitral annulus calcifications. Small volume pericardial fluid dependently, similar to prior exam. Mediastinum/Nodes: Shotty mediastinal lymph nodes all subcentimeter in short axis. Previous right thyroid nodule not well seen on the current exam. Limited assessment for hilar adenopathy given lack of IV contrast. Minimal distal esophageal wall thickening. Lungs/Pleura: Smooth septal thickening with mild diffuse ground-glass opacities suggesting pulmonary edema. Nodular and ground-glass opacity in the left upper lobe, with scattered smaller ill-defined nodular opacities in the left lower lobe. 4 mm right upper lobe pulmonary nodule. No significant pleural effusion. Mild central bronchial thickening. Trachea mainstem bronchi are otherwise patent. Upper Abdomen: High-density material in the colon, ingested material may be enteric contrast or other ingested material. Musculoskeletal: There are no acute or suspicious osseous abnormalities. Degenerative change of the right shoulder.Coarse left breast calcifications, typically benign. IMPRESSION: 1. Septal thickening and ground-glass opacities consistent with pulmonary edema. Cardiomegaly appears similar to prior exam. 2. Multifocal regions of ill-defined nodularity and slightly more patchy opacity, most prominent in the left upper  lobe, may be superimposed infectious or inflammatory process. Pulmonary nodules are new from prior exam in presumed inflammatory, however recommend follow-up CT in 12 months. 3. Aortic atherosclerosis and coronary artery calcifications. Mitral annulus calcifications. Mild distal esophageal wall thickening can be seen with reflux or esophagitis. Aortic Atherosclerosis (ICD10-I70.0). Electronically Signed   By: Keith Rake M.D.   On: 05/07/2018 04:06        Scheduled Meds: . amLODipine  10 mg Oral QHS  . atropine  1 drop Right Eye BID  . carvedilol  25 mg Oral BID WC  . gabapentin  100 mg Oral BID  . insulin aspart  0-5 Units Subcutaneous QHS  . insulin aspart  0-9 Units Subcutaneous TID WC  . lanthanum  2,000 mg Oral TID WC  . lisinopril  40 mg Oral QHS  . multivitamin  1 tablet Oral QHS  . pantoprazole  40 mg Oral Daily  . prednisoLONE acetate  1 drop Right Eye BID   Continuous Infusions: . azithromycin    .  cefTRIAXone (ROCEPHIN)  IV       LOS: 0 days    Time spent: [place], MD [facility]  If 7PM-7AM, please contact night-coverage [email or web address] Password [email or web address] 05/07/2018, 1:24 PM

## 2018-05-07 NOTE — Consult Note (Addendum)
Renal Service Consult Note [facility]  [name] 05/07/2018 Requesting Physician:  Dr Mora Bellman  Reason for Consult:  ESRD pt w hemoptysis  HPI: [name] is a  67 y.o. year-old woman with PMHx ESRD, HTN, Type 2 DM (with blindness) who was admitted with hemoptysis.  Presented to the ED with recent bouts of coughing and some hemoptysis for the last 2 days.  Also sore throat and chills.  No n/v/d.  Has been having some L sided abd pain or under the L breast and also some epigastric pain. +SOB , she feels she may have some extra fluid.  +orthopnea.  She did have some sweats, no chills.    In the ED, labs showed Na 138, K 4.9, Cr 8.8, WBC 7.8, Hgb 9.4. Chest CT showed ground glass opacities consistent with pulmonary edema +/- superimposed pneumonia. She was started on Ceftriaxone and Azithromycin and admitted.  Dialyzes on MWF schedule at [facility]. Last HD was Wed but didn't get to dry wt (left 3L up). No recent issues with RUE AVF.   Lives with her sister and daughter and grandbaby.  On ESRD since 2014 , cause of esrd was DM.    She is on 3 bp meds and insulin at home.   ROS  denies CP  no joint pain   no HA  no blurry vision  no rash  no diarrhea  no nausea/ vomiting  no dysuria  no difficulty voiding  no change in urine color    Past Medical History  Past Medical History:  Diagnosis Date  . Anemia   . Anxiety   . Arthritis    "joints" (06/15/2013)  . Asthma   . 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 Blood transfusion without reported diagnosis   . Breast cancer ([place])    left  . CHF (congestive heart failure) ([place])   . 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 Esophageal ulcer with bleeding   . ESRD (end stage renal disease) ([place])    "suppose to start dialysis today" (06/15/2013)  . GERD (gastroesophageal reflux disease)   . Glaucoma    blind in both eyes  . Heart murmur   . Hx of adenomatous colonic  polyps 04/07/2018  . Hypertension   . Myalgia 12/31/2011  . Neuropathy 12/31/2011  . Shortness of breath    "when she doesn't go to dialysis"  . Type II diabetes mellitus ([place])    Past Surgical History  Past Surgical History:  Procedure Laterality Date  . ABDOMINAL HYSTERECTOMY     partial  . AV FISTULA PLACEMENT Right 06/06/2013   Procedure: ARTERIOVENOUS (AV) FISTULA CREATION-RIGHT BRACHIAL CEPHALIC;  Surgeon: Conrad , MD;  Location: [facility];  Service: Vascular;  Laterality: Right;  . BREAST BIOPSY Left   . BREAST LUMPECTOMY Left    "and took out some lymph nodes" (06/15/2013)  . CARPAL TUNNEL RELEASE Left 11/26/2017   Procedure: LEFT CARPAL TUNNEL RELEASE;  Surgeon: [name], MD;  Location: [facility];  Service: Orthopedics;  Laterality: Left;  . CATARACT EXTRACTION W/ ANTERIOR VITRECTOMY Bilateral   . CESAREAN SECTION  1980  . COLONOSCOPY    . ESOPHAGOGASTRODUODENOSCOPY N/A 07/08/2017   Procedure: ESOPHAGOGASTRODUODENOSCOPY (EGD);  Surgeon: [name], MD;  Location: [facility] ENDOSCOPY;  Service: Endoscopy;  Laterality: N/A;  . ESOPHAGOGASTRODUODENOSCOPY (EGD) WITH PROPOFOL N/A 02/07/2018   Procedure: ESOPHAGOGASTRODUODENOSCOPY (EGD) WITH PROPOFOL;  Surgeon: Zenovia Jarred  M, MD;  Location: [facility];  Service: Gastroenterology;  Laterality: N/A;  APC and clips placed  . EYE SURGERY Bilateral    laser surgery  . PARS PLANA VITRECTOMY Right 05/05/2017   Procedure: PARS PLANA VITRECTOMY WITH 25 GAUGE; PARTIAL REMOVAL OF OIL; INFERIOR PERIPHERAL IRIDECTOMY, REFORM ANTERIOR CHAMBER RIGHT EYE;  Surgeon: [name], MD;  Location: [facility];  Service: Ophthalmology;  Laterality: Right;  . REFRACTIVE SURGERY Bilateral   . REMOVAL OF A DIALYSIS CATHETER Right 06/06/2013   Procedure: REMOVAL OF RIGHT MEDIPORT;  Surgeon: Conrad Barnum, MD;  Location: [place] [facility];  Service: Vascular;  Laterality: Right;  . TONSILLECTOMY    . UPPER GASTROINTESTINAL ENDOSCOPY     Family History  Family  History  Problem Relation Age of Onset  . Diabetes Mother   . Hyperlipidemia Mother   . Hypertension Mother   . Hypertension Father   . Diabetes Sister   . Diabetes Brother   . Hypertension Brother   . Heart attack Brother   . Kidney disease Brother   . Colon cancer Neg Hx   . Colon polyps Neg Hx   . Esophageal cancer Neg Hx   . Gallbladder disease Neg Hx   . Rectal cancer Neg Hx   . Stomach cancer Neg Hx    Social History  reports that she has never smoked. She has never used smokeless tobacco. She reports that she does not drink alcohol or use drugs. Allergies  Allergies  Allergen Reactions  . Latex Hives  . Oxycodone Other (See Comments)    Hallucinations   . Tramadol Other (See Comments)    hallucinations   . Tape Rash    78M Transpore adhesive tape. Medical tape  . Vicodin [Hydrocodone-Acetaminophen] Other (See Comments)    hallucinations   Home medications Prior to Admission medications   Medication Sig Start Date End Date Taking? Authorizing Provider  acetaminophen (TYLENOL) 500 MG tablet Take 500 mg by mouth 2 (two) times daily.   Yes [provider]  albuterol (PROVENTIL) (2.5 MG/78ML) 0.083% nebulizer solution Take 3 mLs (2.5 mg total) by nebulization every 6 (six) hours as needed for wheezing or shortness of breath. 10/30/16  Yes Theodis Blaze, MD  amLODipine (NORVASC) 10 MG tablet Take 10 mg at bedtime by mouth. 06/02/17  Yes [provider]  aspirin 81 MG chewable tablet Chew 81 mg by mouth daily with lunch.    Yes [provider]  atropine 1 % ophthalmic solution Place 1 drop into the right eye 2 (two) times daily. 07/04/17  Yes [provider]  B Complex-C-Folic Acid (RENA-VITE RX) 1 MG TABS Take 1 tablet by mouth daily with lunch.  09/29/16  Yes [provider]  carvedilol (COREG) 25 MG tablet Take 25 mg by mouth 2 (two) times daily. 04/03/18  Yes [provider]  gabapentin (NEURONTIN) 100 MG capsule Take 100  mg by mouth 2 (two) times daily.   Yes [provider]  insulin aspart (NOVOLOG FLEXPEN) 100 UNIT/ML FlexPen Inject 8-10 Units into the skin 3 (three) times daily with meals. per Sliding scale   Yes [provider]  lanthanum (FOSRENOL) 1000 MG chewable tablet Chew 2,000 mg by mouth 3 (three) times daily with meals.    Yes [provider]  lisinopril (PRINIVIL,ZESTRIL) 40 MG tablet Take 40 mg by mouth at bedtime.    Yes [provider]  omeprazole (PRILOSEC) 40 MG capsule Take 1 capsule (40 mg total) by  mouth daily. 02/09/18  Yes Cherene Altes, MD  prednisoLONE acetate (PRED FORTE) 1 % ophthalmic suspension Place 1 drop into the right eye 2 (two) times daily.  04/27/17  Yes [provider]  darbepoetin (ARANESP) 200 MCG/0.4ML SOLN injection Inject 0.4 mLs (200 mcg total) into the vein every Wednesday with hemodialysis. Patient not taking: Reported on 05/06/2018 06/22/13   Geradine Girt, DO  doxercalciferol (HECTOROL) 4 MCG/2ML injection Inject 0.5 mLs (1 mcg total) into the vein every Monday, Wednesday, and Friday with hemodialysis. Patient not taking: Reported on 05/06/2018 06/18/13   Geradine Girt, DO  [facility] VERIO test strip 3 (three) times daily. for testing 12/15/17   [provider]   Liver Function Tests Recent Labs  Lab 05/07/18 0201  AST 23  ALT 20  ALKPHOS 66  BILITOT 0.7  PROT 6.8  ALBUMIN 3.1*   Recent Labs  Lab 05/07/18 0201  LIPASE 45   CBC Recent Labs  Lab 05/06/18 1941  WBC 7.8  NEUTROABS 4.4  HGB 9.4*  HCT 30.3*  MCV 92.9  PLT 071   Basic Metabolic Panel Recent Labs  Lab 05/06/18 1941  NA 138  K 4.9  CL 97*  CO2 25  GLUCOSE 163*  BUN 32*  CREATININE 8.81*  CALCIUM 9.4   Iron/TIBC/Ferritin/ %Sat    Component Value Date/Time   IRON 101 07/06/2017 1255   TIBC 267 07/06/2017 1255   FERRITIN 1,992 (H) 07/06/2017 1255   IRONPCTSAT 38 (H) 07/06/2017 1255   IRONPCTSAT 23 12/31/2011 1056     Vitals:   05/07/18 1030 05/07/18 1045 05/07/18 1100 05/07/18 1300  BP: (!) 148/61  (!) 146/65 (!) 160/85  Pulse: 80 78 81   Resp: 19 18 15  (!) 22  Temp:    98.7 F (37.1 C)  TempSrc:    Oral  SpO2: 93% 99% 94% 91%  Weight:      Height:       Exam Gen alert,no distress, nasal o2 No rash, cyanosis or gangrene Sclera anicteric, throat clear  No jvd or bruits Chest bibasillar mild rales RRR no MRG Abd soft ntnd no mass or ascites +bs GU defer MS no joint effusions or deformity Ext 1+ bilat LE edema, no wounds or ulcers Neuro is alert, Ox 3 , nf RUE AVF +bruit  CXCR - borderline edema CT chest- patchy but diffuse ground glass   [facility]  4h  87kg  2/2.25 bath  P2  AVF Linear Na  Hep none  - mirc era 150 every2  - hect 2 ug  Impression/ Plan: 1. Chest pain/SOB - per CT and exam looks like vol overload. Plan HD tonight and possibly again tomorrow to get volume down and will reasses then.   2. ESRD on HD MWF 3. HTN 4. DM2 on insulin 5. Anemia of ESRD- Hgb 9.5, follow 6. ?hemoptysis: Pulm in nature v. GI . No signs of active bleeding-- follow Hgb. 7. Secondary HPTH: Ca ok. Resume home binders (Fosrenol) 8. Type 2 DM  Veneta Penton, PA-C [employer] Pager [phone]  Pt seen, examined and agree w A/P as above.  Kelly Splinter MD [employer] pager [phone]   05/07/2018, 5:01 PM

## 2018-05-08 ENCOUNTER — Other Ambulatory Visit: Payer: Self-pay

## 2018-05-08 ENCOUNTER — Encounter (HOSPITAL_COMMUNITY): Payer: Self-pay | Admitting: *Deleted

## 2018-05-08 LAB — RENAL FUNCTION PANEL
Albumin: 2.9 g/dL — ABNORMAL LOW (ref 3.5–5.0)
Anion gap: 13 (ref 5–15)
BUN: 19 mg/dL (ref 8–23)
CO2: 28 mmol/L (ref 22–32)
Calcium: 8.4 mg/dL — ABNORMAL LOW (ref 8.9–10.3)
Chloride: 95 mmol/L — ABNORMAL LOW (ref 98–111)
Creatinine, Ser: 6.48 mg/dL — ABNORMAL HIGH (ref 0.44–1.00)
GFR calc Af Amer: 7 mL/min — ABNORMAL LOW (ref >60)
GFR calc non Af Amer: 6 mL/min — ABNORMAL LOW (ref >60)
Glucose, Bld: 258 mg/dL — ABNORMAL HIGH (ref 70–99)
Phosphorus: 3.9 mg/dL (ref 2.5–4.6)
Potassium: 3.9 mmol/L (ref 3.5–5.1)
Sodium: 136 mmol/L (ref 135–145)

## 2018-05-08 LAB — CBC
HCT: 28.4 % — ABNORMAL LOW (ref 36.0–46.0)
Hemoglobin: 9.3 g/dL — ABNORMAL LOW (ref 12.0–15.0)
MCH: 30.2 pg (ref 26.0–34.0)
MCHC: 32.7 g/dL (ref 30.0–36.0)
MCV: 92.2 fL (ref 80.0–100.0)
Platelets: 191 K/uL (ref 150–400)
RBC: 3.08 MIL/uL — ABNORMAL LOW (ref 3.87–5.11)
RDW: 15.8 % — ABNORMAL HIGH (ref 11.5–15.5)
WBC: 6.5 K/uL (ref 4.0–10.5)
nRBC: 0 % (ref 0.0–0.2)

## 2018-05-08 LAB — GLUCOSE, CAPILLARY
GLUCOSE-CAPILLARY: 130 mg/dL — AB (ref 70–99)
Glucose-Capillary: 110 mg/dL — ABNORMAL HIGH (ref 70–99)
Glucose-Capillary: 228 mg/dL — ABNORMAL HIGH (ref 70–99)

## 2018-05-08 LAB — MAGNESIUM: Magnesium: 1.9 mg/dL (ref 1.7–2.4)

## 2018-05-08 LAB — LEGIONELLA PNEUMOPHILA SEROGP 1 UR AG: L. PNEUMOPHILA SEROGP 1 UR AG: NEGATIVE

## 2018-05-08 MED ORDER — FLUCONAZOLE 150 MG PO TABS: 150.0000 mg | ORAL_TABLET | Freq: Every day | ORAL | 0 refills | Status: DC

## 2018-05-08 MED ORDER — FLUCONAZOLE 150 MG PO TABS: 150.0000 mg | ORAL_TABLET | Freq: Every day | ORAL | 0 refills | Status: AC

## 2018-05-08 MED ORDER — DOXYCYCLINE HYCLATE 100 MG PO CAPS: 100.0000 mg | ORAL_CAPSULE | Freq: Two times a day (BID) | ORAL | 0 refills | Status: AC

## 2018-05-08 MED ORDER — DOXYCYCLINE HYCLATE 100 MG PO CAPS: 100.0000 mg | ORAL_CAPSULE | Freq: Two times a day (BID) | ORAL | 0 refills | Status: DC

## 2018-05-08 NOTE — Progress Notes (Signed)
[facility] Progress Note  Subjective: 2.3 L off on HD yest, breathing good today, no more hemoptysis, started on IV abx for CAP.   Vitals:   05/08/18 0050 05/08/18 0100 05/08/18 0340 05/08/18 1210  BP: (!) 157/65 (!) 157/65 (!) 133/55 (!) 141/68  Pulse:  84 87 84  Resp:  18 18 16   Temp:  98.4 F (36.9 C) 99.2 F (37.3 C) 99.5 F (37.5 C)  TempSrc:  Oral Oral Oral  SpO2:  100% 97% 91%  Weight:      Height:        Inpatient medications: . amLODipine  10 mg Oral QHS  . atropine  1 drop Right Eye BID  . carvedilol  25 mg Oral BID WC  . Chlorhexidine Gluconate Cloth  6 each Topical Q0600  . gabapentin  100 mg Oral BID  . insulin aspart  0-5 Units Subcutaneous QHS  . insulin aspart  0-9 Units Subcutaneous TID WC  . lanthanum  2,000 mg Oral TID WC  . lisinopril  40 mg Oral QHS  . multivitamin  1 tablet Oral QHS  . pantoprazole  40 mg Oral Daily  . prednisoLONE acetate  1 drop Right Eye BID   . azithromycin 500 mg (05/07/18 2210)  . cefTRIAXone (ROCEPHIN)  IV 1 g (05/08/18 0057)   albuterol, dextromethorphan-guaiFENesin  Iron/TIBC/Ferritin/ %Sat    Component Value Date/Time   IRON 101 07/06/2017 1255   TIBC 267 07/06/2017 1255   FERRITIN 1,992 (H) 07/06/2017 1255   IRONPCTSAT 38 (H) 07/06/2017 1255   IRONPCTSAT 23 12/31/2011 1056    Exam: Gen alert,no distress, nasal o2 No jvd or bruits Chest mostly clear RRR no MRG Abd soft ntnd no mass or ascites +bs Ext no LE edema Neuro is alert, Ox 3 , nf RUE AVF +bruit  CXCR - borderline edema CT chest- patchy but diffuse ground glass   [facility]  4h  87kg  2/2.25 bath  P2  AVF Linear Na  Hep none  - mirc era 150 every2  - hect 2 ug  Impression/ Plan: 1. Cough/ hemoptysis/ CP - on abx for CAP.  Going home per pt 2. ESRD on HD MWF - had HD yesterday , 2.3 L off, still over dry by 2-3kg but stable w/o sig resp issues.  3. HTN - cont meds 4. DM2 on insulin 5. Anemia of ESRD- Hgb 9.5,  follow 6. Secondary HPTH: Ca ok. Resume home binders (Fosrenol) 7. Type 2 DM 8. Dipso - per primary, stable from renal standpoint   Kelly Splinter MD [facility] pager [phone]   05/08/2018, 2:30 PM   Recent Labs  Lab 05/07/18 0201 05/07/18 2013 05/08/18 0349  NA  --  135 136  K  --  5.0 3.9  CL  --  99 95*  CO2  --  23 28  GLUCOSE  --  136* 258*  BUN  --  46* 19  CREATININE  --  11.19* 6.48*  CALCIUM  --  8.7* 8.4*  PHOS  --  6.8* 3.9  ALBUMIN 3.1* 3.1* 2.9*  INR 1.16  --   --    Recent Labs  Lab 05/07/18 0201  AST 23  ALT 20  ALKPHOS 66  BILITOT 0.7  PROT 6.8   Recent Labs  Lab 05/06/18 1941 05/07/18 2013 05/08/18 0349  WBC 7.8 7.5 6.5  NEUTROABS 4.4  --   --   HGB 9.4* 9.0* 9.3*  HCT 30.3* 29.1*  28.4*  MCV 92.9 94.5 92.2  PLT 218 202 191

## 2018-05-08 NOTE — Plan of Care (Signed)
  Problem: Education: Goal: Knowledge of General Education information will improve Description Including pain rating scale, medication(s)/side effects and non-pharmacologic comfort measures Outcome: Adequate for Discharge   Problem: Health Behavior/Discharge Planning: Goal: Ability to manage health-related needs will improve Outcome: Adequate for Discharge   Problem: Clinical Measurements: Goal: Ability to maintain clinical measurements within normal limits will improve Outcome: Adequate for Discharge Goal: Will remain free from infection Outcome: Adequate for Discharge Goal: Diagnostic test results will improve Outcome: Adequate for Discharge Goal: Respiratory complications will improve Outcome: Adequate for Discharge Goal: Cardiovascular complication will be avoided Outcome: Adequate for Discharge   Problem: Activity: Goal: Risk for activity intolerance will decrease Outcome: Adequate for Discharge   Problem: Nutrition: Goal: Adequate nutrition will be maintained Outcome: Adequate for Discharge   Problem: Coping: Goal: Level of anxiety will decrease Outcome: Adequate for Discharge   Problem: Elimination: Goal: Will not experience complications related to bowel motility Outcome: Adequate for Discharge Goal: Will not experience complications related to urinary retention Outcome: Adequate for Discharge   Problem: Pain Managment: Goal: General experience of comfort will improve Outcome: Adequate for Discharge   Problem: Safety: Goal: Ability to remain free from injury will improve Outcome: Adequate for Discharge   Problem: Skin Integrity: Goal: Risk for impaired skin integrity will decrease Outcome: Adequate for Discharge  D/c home with sister. Discharge instructions given. NO further questions per pt.

## 2018-05-08 NOTE — Discharge Instructions (Signed)

## 2018-05-08 NOTE — Progress Notes (Signed)
HD tx completed @ 2310 w/cramping issues  UF goal not met d/t UF off for cramping intermittently throughout tx Blood rinsed back VSS Report called to Lelon Frohlich, RN

## 2018-05-08 NOTE — Discharge Summary (Signed)
Physician Discharge Summary  OZA OBERLE ZOX:[id number] DOB: 1950/09/24 DOA: 05/06/2018  PCP: Prince Solian, MD  Admit date: 05/06/2018 Discharge date: 05/08/2018  Admitted From: Home Disposition:  Home  Recommendations for Outpatient Follow-up:  1. Follow up with PCP in 1-2 weeks 2. Please obtain BMP/CBC in one week 3. Please follow up on the following pending results:  Home Health: No Equipment/Devices: No  Discharge Condition: Stable CODE STATUS: Full Diet recommendation: Renal/carb restricted diet  Brief/Interim Summary:  #) Hemoptysis due to community-acquired pneumonia: Patient was admitted with hemoptysis.  CT chest without contrast showed evidence of fluid overload as well as possibly pneumonia.  Patient did report 1 week of cough and congestion and chills.  CTA PE was not performed as her d-dimer was normal.  Patient was given IV ceftriaxone and azithromycin with resolution of hemoptysis.  Hemoglobin was stable and actually improved slightly on discharge.  Patient was discharged home with doxycycline.  She was additionally given a dose of fluconazole she develops candidal intertrigo in the setting of antibiotic use.  #) ESRD/fluid overload: This was noted on CT.  Nephrology was consulted and patient was dialyzed.  She was continued on her home doxercalciferol injections.  #) Type 2 diabetes: Patient was maintained on sliding scale insulin.  #) Hypertension: Patient was continued on amlodipine, carvedilol, lisinopril.  Her aspirin was initially held but restarted on discharge.  #) Pain/psych: Patient was continued on home gabapentin.   Discharge Diagnoses:  Principal Problem:   Hemoptysis Active Problems:   Type II diabetes mellitus with renal manifestations (HCC)   Essential hypertension   GERD (gastroesophageal reflux disease)   ESRD on dialysis (HCC)   Blindness of both eyes   Chronic combined systolic and diastolic CHF (congestive heart failure) (HCC)    Abdominal discomfort, epigastric   Anemia in ESRD (end-stage renal disease) ([place])   Asthma    Discharge Instructions  Discharge Instructions    Call MD for:  difficulty breathing, headache or visual disturbances   Complete by:  As directed    Call MD for:  extreme fatigue   Complete by:  As directed    Call MD for:  hives   Complete by:  As directed    Call MD for:  persistant dizziness or light-headedness   Complete by:  As directed    Call MD for:  persistant nausea and vomiting   Complete by:  As directed    Call MD for:  redness, tenderness, or signs of infection (pain, swelling, redness, odor or green/yellow discharge around incision site)   Complete by:  As directed    Call MD for:  severe uncontrolled pain   Complete by:  As directed    Call MD for:  temperature >100.4   Complete by:  As directed    Diet - low sodium heart healthy   Complete by:  As directed    Discharge instructions   Complete by:  As directed    Please follow-up with your primary care doctor in 1 week.  Please take your antibiotics as prescribed.  Please take the 1 dose of fluconazole for yeast the day you complete your antibiotics.   Increase activity slowly   Complete by:  As directed      Allergies as of 05/08/2018      Reactions   Latex Hives   Oxycodone Other (See Comments)   Hallucinations    Tramadol Other (See Comments)   hallucinations    Tape Rash  47M Transpore adhesive tape. Medical tape   Vicodin [hydrocodone-acetaminophen] Other (See Comments)   hallucinations      Medication List    TAKE these medications   acetaminophen 500 MG tablet Commonly known as:  TYLENOL Take 500 mg by mouth 2 (two) times daily.   albuterol (2.5 MG/47ML) 0.083% nebulizer solution Commonly known as:  PROVENTIL Take 3 mLs (2.5 mg total) by nebulization every 6 (six) hours as needed for wheezing or shortness of breath.   amLODipine 10 MG tablet Commonly known as:  NORVASC Take 10 mg at bedtime by  mouth.   aspirin 81 MG chewable tablet Chew 81 mg by mouth daily with lunch.   atropine 1 % ophthalmic solution Place 1 drop into the right eye 2 (two) times daily.   carvedilol 25 MG tablet Commonly known as:  COREG Take 25 mg by mouth 2 (two) times daily.   darbepoetin 200 MCG/0.4ML Soln injection Commonly known as:  ARANESP Inject 0.4 mLs (200 mcg total) into the vein every Wednesday with hemodialysis.   doxercalciferol 4 MCG/2ML injection Commonly known as:  HECTOROL Inject 0.5 mLs (1 mcg total) into the vein every Monday, Wednesday, and Friday with hemodialysis.   doxycycline 100 MG capsule Commonly known as:  VIBRAMYCIN Take 1 capsule (100 mg total) by mouth 2 (two) times daily for 4 days.   fluconazole 150 MG tablet Commonly known as:  DIFLUCAN Take 1 tablet (150 mg total) by mouth daily for 1 day.   gabapentin 100 MG capsule Commonly known as:  NEURONTIN Take 100 mg by mouth 2 (two) times daily.   lanthanum 1000 MG chewable tablet Commonly known as:  FOSRENOL Chew 2,000 mg by mouth 3 (three) times daily with meals.   lisinopril 40 MG tablet Commonly known as:  PRINIVIL,ZESTRIL Take 40 mg by mouth at bedtime.   NOVOLOG FLEXPEN 100 UNIT/ML FlexPen Generic drug:  insulin aspart Inject 8-10 Units into the skin 3 (three) times daily with meals. per Sliding scale   omeprazole 40 MG capsule Commonly known as:  PRILOSEC Take 1 capsule (40 mg total) by mouth daily.   ONETOUCH VERIO test strip Generic drug:  glucose blood 3 (three) times daily. for testing   prednisoLONE acetate 1 % ophthalmic suspension Commonly known as:  PRED FORTE Place 1 drop into the right eye 2 (two) times daily.   RENA-VITE RX 1 MG Tabs Take 1 tablet by mouth daily with lunch.       Allergies  Allergen Reactions  . Latex Hives  . Oxycodone Other (See Comments)    Hallucinations   . Tramadol Other (See Comments)    hallucinations   . Tape Rash    47M Transpore adhesive tape.  Medical tape  . Vicodin [Hydrocodone-Acetaminophen] Other (See Comments)    hallucinations    Consultations:  Nephrology   Procedures/Studies: Dg Chest 2 View  Result Date: 05/06/2018 CLINICAL DATA:  Chest pain, cough, sore throat, and chills. EXAM: CHEST - 2 VIEW COMPARISON:  02/06/2018 FINDINGS: The cardiac silhouette remains enlarged. Aortic atherosclerosis is noted. Mild pulmonary vascular congestion is similar to the prior study. No overt edema, airspace consolidation, pleural effusion, pneumothorax is identified. No acute osseous abnormality is seen. IMPRESSION: Cardiomegaly and mild pulmonary vascular congestion. Electronically Signed   By: Logan Bores M.D.   On: 05/06/2018 20:29   Ct Chest Wo Contrast  Result Date: 05/07/2018 CLINICAL DATA:  Hemoptysis.  Cough. EXAM: CT CHEST WITHOUT CONTRAST TECHNIQUE: Multidetector CT imaging of the  chest was performed following the standard protocol without IV contrast. COMPARISON:  Radiographs yesterday.  Chest CT 10/28/2016 FINDINGS: Cardiovascular: Dense aortic atherosclerosis. Coronary artery calcifications versus stents. Mild cardiomegaly with mitral annulus calcifications. Small volume pericardial fluid dependently, similar to prior exam. Mediastinum/Nodes: Shotty mediastinal lymph nodes all subcentimeter in short axis. Previous right thyroid nodule not well seen on the current exam. Limited assessment for hilar adenopathy given lack of IV contrast. Minimal distal esophageal wall thickening. Lungs/Pleura: Smooth septal thickening with mild diffuse ground-glass opacities suggesting pulmonary edema. Nodular and ground-glass opacity in the left upper lobe, with scattered smaller ill-defined nodular opacities in the left lower lobe. 4 mm right upper lobe pulmonary nodule. No significant pleural effusion. Mild central bronchial thickening. Trachea mainstem bronchi are otherwise patent. Upper Abdomen: High-density material in the colon, ingested  material may be enteric contrast or other ingested material. Musculoskeletal: There are no acute or suspicious osseous abnormalities. Degenerative change of the right shoulder.Coarse left breast calcifications, typically benign. IMPRESSION: 1. Septal thickening and ground-glass opacities consistent with pulmonary edema. Cardiomegaly appears similar to prior exam. 2. Multifocal regions of ill-defined nodularity and slightly more patchy opacity, most prominent in the left upper lobe, may be superimposed infectious or inflammatory process. Pulmonary nodules are new from prior exam in presumed inflammatory, however recommend follow-up CT in 12 months. 3. Aortic atherosclerosis and coronary artery calcifications. Mitral annulus calcifications. Mild distal esophageal wall thickening can be seen with reflux or esophagitis. Aortic Atherosclerosis (ICD10-I70.0). Electronically Signed   By: Keith Rake M.D.   On: 05/07/2018 04:06     Subjective:   Discharge Exam: Vitals:   05/08/18 0100 05/08/18 0340  BP: (!) 157/65 (!) 133/55  Pulse: 84 87  Resp: 18 18  Temp: 98.4 F (36.9 C) 99.2 F (37.3 C)  SpO2: 100% 97%   Vitals:   05/07/18 2331 05/08/18 0050 05/08/18 0100 05/08/18 0340  BP: (!) 159/75 (!) 157/65 (!) 157/65 (!) 133/55  Pulse: 93  84 87  Resp: 18  18 18   Temp: 97.6 F (36.4 C)  98.4 F (36.9 C) 99.2 F (37.3 C)  TempSrc: Oral  Oral Oral  SpO2: 100%  100% 97%  Weight: 90.5 kg     Height:       General exam: Appears calm and comfortable  Respiratory system: No increased work of breathing, scattered rhonchi, no wheezes, crackles Cardiovascular system: Regular rate and rhythm, no murmurs Gastrointestinal system: Abdomen is nondistended, soft and nontender. No organomegaly or masses felt. Normal bowel sounds heard. Central nervous system: Grossly intact, moving all extremities, noted to be blind. Extremities: Fistula site is clean dry and intact Skin: No rashes over visible  skin Psychiatry: Judgement and insight appear normal. Mood & affect appropriate.    The results of significant diagnostics from this hospitalization (including imaging, microbiology, ancillary and laboratory) are listed below for reference.     Microbiology: Recent Results (from the past 240 hour(s))  Blood culture (routine x 2)     Status: None (Preliminary result)   Collection Time: 05/06/18 10:45 PM  Result Value Ref Range Status   Specimen Description BLOOD LEFT ARM  Final   Special Requests   Final    BOTTLES DRAWN AEROBIC AND ANAEROBIC Blood Culture adequate volume   Culture   Final    NO GROWTH < 24 HOURS Performed at [facility], [address] [place], [place] [address]    Report Status PENDING  Incomplete  Blood culture (routine x 2)  Status: None (Preliminary result)   Collection Time: 05/06/18 10:47 PM  Result Value Ref Range Status   Specimen Description BLOOD LEFT ANTECUBITAL  Final   Special Requests   Final    BOTTLES DRAWN AEROBIC AND ANAEROBIC Blood Culture adequate volume   Culture   Final    NO GROWTH < 24 HOURS Performed at [facility], [address], [place], [place] [address]    Report Status PENDING  Incomplete  Culture, sputum-assessment     Status: None   Collection Time: 05/07/18  8:18 AM  Result Value Ref Range Status   Specimen Description SPUTUM  Final   Special Requests NONE  Final   Sputum evaluation   Final    Sputum specimen not acceptable for testing.  Please recollect.   Results Called to: A. MOUHAMAD, RN AT 1318 ON 05/07/18 BY C. JESSUP, MLT. Performed at [facility], [place] [address], [place], [place] [address]    Report Status 05/07/2018 FINAL  Final  Gram stain     Status: None   Collection Time: 05/07/18  8:18 AM  Result Value Ref Range Status   Specimen Description URINE, RANDOM  Final   Special Requests NONE  Final   Gram Stain   Final    NO WBC SEEN GRAM POSITIVE RODS CYTOSPIN SMEAR Performed at  [facility], [place] [address] [place], [place] [address]    Report Status 05/07/2018 FINAL  Final  Respiratory Panel by PCR     Status: None   Collection Time: 05/07/18  8:18 AM  Result Value Ref Range Status   Adenovirus NOT DETECTED NOT DETECTED Final   Coronavirus 229E NOT DETECTED NOT DETECTED Final   Coronavirus HKU1 NOT DETECTED NOT DETECTED Final   Coronavirus NL63 NOT DETECTED NOT DETECTED Final   Coronavirus OC43 NOT DETECTED NOT DETECTED Final   Metapneumovirus NOT DETECTED NOT DETECTED Final   Rhinovirus / Enterovirus NOT DETECTED NOT DETECTED Final   Influenza A NOT DETECTED NOT DETECTED Final   Influenza B NOT DETECTED NOT DETECTED Final   Parainfluenza Virus 1 NOT DETECTED NOT DETECTED Final   Parainfluenza Virus 2 NOT DETECTED NOT DETECTED Final   Parainfluenza Virus 3 NOT DETECTED NOT DETECTED Final   Parainfluenza Virus 4 NOT DETECTED NOT DETECTED Final   Respiratory Syncytial Virus NOT DETECTED NOT DETECTED Final   Bordetella pertussis NOT DETECTED NOT DETECTED Final   Chlamydophila pneumoniae NOT DETECTED NOT DETECTED Final   Mycoplasma pneumoniae NOT DETECTED NOT DETECTED Final    Comment: Performed at [facility], [place] [address] [place], [place] [address]  MRSA PCR Screening     Status: None   Collection Time: 05/07/18  5:54 PM  Result Value Ref Range Status   MRSA by PCR NEGATIVE NEGATIVE Final    Comment:        The GeneXpert MRSA Assay (FDA approved for NASAL specimens only), is one component of a comprehensive MRSA colonization surveillance program. It is not intended to diagnose MRSA infection nor to guide or monitor treatment for MRSA infections. Performed at [facility], [place] [address] [place], [place] [address]      Labs: BNP (last 3 results) Recent Labs    06/05/17 0322 07/06/17 0731  BNP >4,500.0* 595.6*   Basic Metabolic Panel: Recent Labs  Lab 05/06/18 1941 05/07/18 2013 05/08/18 0349  NA 138 135 136  K  4.9 5.0 3.9  CL 97* 99 95*  CO2 25 23 28   GLUCOSE 163* 136* 258*  BUN 32* 46* 19  CREATININE 8.81* 11.19* 6.48*  CALCIUM 9.4 8.7* 8.4*  MG  --   --  1.9  PHOS  --  6.8* 3.9   Liver Function Tests: Recent Labs  Lab 05/07/18 0201 05/07/18 2013 05/08/18 0349  AST 23  --   --   ALT 20  --   --   ALKPHOS 66  --   --   BILITOT 0.7  --   --   PROT 6.8  --   --   ALBUMIN 3.1* 3.1* 2.9*   Recent Labs  Lab 05/07/18 0201  LIPASE 45   No results for input(s): AMMONIA in the last 168 hours. CBC: Recent Labs  Lab 05/06/18 1941 05/07/18 2013 05/08/18 0349  WBC 7.8 7.5 6.5  NEUTROABS 4.4  --   --   HGB 9.4* 9.0* 9.3*  HCT 30.3* 29.1* 28.4*  MCV 92.9 94.5 92.2  PLT 218 202 191   Cardiac Enzymes: No results for input(s): CKTOTAL, CKMB, CKMBINDEX, TROPONINI in the last 168 hours. BNP: Invalid input(s): POCBNP CBG: Recent Labs  Lab 05/07/18 0953 05/07/18 1304 05/07/18 1712 05/08/18 0039 05/08/18 0835  GLUCAP 102* 205* 161* 130* 110*   D-Dimer Recent Labs    05/07/18 0124  DDIMER 0.48   Hgb A1c No results for input(s): HGBA1C in the last 72 hours. Lipid Profile No results for input(s): CHOL, HDL, LDLCALC, TRIG, CHOLHDL, LDLDIRECT in the last 72 hours. Thyroid function studies No results for input(s): TSH, T4TOTAL, T3FREE, THYROIDAB in the last 72 hours.  Invalid input(s): FREET3 Anemia work up No results for input(s): VITAMINB12, FOLATE, FERRITIN, TIBC, IRON, RETICCTPCT in the last 72 hours. Urinalysis    Component Value Date/Time   COLORURINE YELLOW 02/06/2018 1709   APPEARANCEUR HAZY (A) 02/06/2018 1709   LABSPEC 1.013 02/06/2018 1709   LABSPEC 1.025 02/13/2011 1533   PHURINE 8.0 02/06/2018 1709   GLUCOSEU 50 (A) 02/06/2018 1709   HGBUR NEGATIVE 02/06/2018 1709   BILIRUBINUR NEGATIVE 02/06/2018 1709   BILIRUBINUR Negative 02/13/2011 1533   KETONESUR NEGATIVE 02/06/2018 1709   PROTEINUR 100 (A) 02/06/2018 1709   UROBILINOGEN 0.2 04/28/2011 1045    NITRITE NEGATIVE 02/06/2018 1709   LEUKOCYTESUR NEGATIVE 02/06/2018 1709   LEUKOCYTESUR Negative 02/13/2011 1533   Sepsis Labs Invalid input(s): PROCALCITONIN,  WBC,  LACTICIDVEN Microbiology Recent Results (from the past 240 hour(s))  Blood culture (routine x 2)     Status: None (Preliminary result)   Collection Time: 05/06/18 10:45 PM  Result Value Ref Range Status   Specimen Description BLOOD LEFT ARM  Final   Special Requests   Final    BOTTLES DRAWN AEROBIC AND ANAEROBIC Blood Culture adequate volume   Culture   Final    NO GROWTH < 24 HOURS Performed at [facility], [place] [address], [place], [place] [address]    Report Status PENDING  Incomplete  Blood culture (routine x 2)     Status: None (Preliminary result)   Collection Time: 05/06/18 10:47 PM  Result Value Ref Range Status   Specimen Description BLOOD LEFT ANTECUBITAL  Final   Special Requests   Final    BOTTLES DRAWN AEROBIC AND ANAEROBIC Blood Culture adequate volume   Culture   Final    NO GROWTH < 24 HOURS Performed at [facility], [place] [address] [place], [place] [address]    Report Status PENDING  Incomplete  Culture,  sputum-assessment     Status: None   Collection Time: 05/07/18  8:18 AM  Result Value Ref Range Status   Specimen Description SPUTUM  Final   Special Requests NONE  Final   Sputum evaluation   Final    Sputum specimen not acceptable for testing.  Please recollect.   Results Called to: A. MOUHAMAD, RN AT 1318 ON 05/07/18 BY C. JESSUP, MLT. Performed at [facility], [place] [address], [place], [place] [address]    Report Status 05/07/2018 FINAL  Final  Gram stain     Status: None   Collection Time: 05/07/18  8:18 AM  Result Value Ref Range Status   Specimen Description URINE, RANDOM  Final   Special Requests NONE  Final   Gram Stain   Final    NO WBC SEEN GRAM POSITIVE RODS CYTOSPIN SMEAR Performed at [facility], [place] [address], [place], [place] [address]     Report Status 05/07/2018 FINAL  Final  Respiratory Panel by PCR     Status: None   Collection Time: 05/07/18  8:18 AM  Result Value Ref Range Status   Adenovirus NOT DETECTED NOT DETECTED Final   Coronavirus 229E NOT DETECTED NOT DETECTED Final   Coronavirus HKU1 NOT DETECTED NOT DETECTED Final   Coronavirus NL63 NOT DETECTED NOT DETECTED Final   Coronavirus OC43 NOT DETECTED NOT DETECTED Final   Metapneumovirus NOT DETECTED NOT DETECTED Final   Rhinovirus / Enterovirus NOT DETECTED NOT DETECTED Final   Influenza A NOT DETECTED NOT DETECTED Final   Influenza B NOT DETECTED NOT DETECTED Final   Parainfluenza Virus 1 NOT DETECTED NOT DETECTED Final   Parainfluenza Virus 2 NOT DETECTED NOT DETECTED Final   Parainfluenza Virus 3 NOT DETECTED NOT DETECTED Final   Parainfluenza Virus 4 NOT DETECTED NOT DETECTED Final   Respiratory Syncytial Virus NOT DETECTED NOT DETECTED Final   Bordetella pertussis NOT DETECTED NOT DETECTED Final   Chlamydophila pneumoniae NOT DETECTED NOT DETECTED Final   Mycoplasma pneumoniae NOT DETECTED NOT DETECTED Final    Comment: Performed at [facility], [place] [address] [place], [place] [address]  MRSA PCR Screening     Status: None   Collection Time: 05/07/18  5:54 PM  Result Value Ref Range Status   MRSA by PCR NEGATIVE NEGATIVE Final    Comment:        The GeneXpert MRSA Assay (FDA approved for NASAL specimens only), is one component of a comprehensive MRSA colonization surveillance program. It is not intended to diagnose MRSA infection nor to guide or monitor treatment for MRSA infections. Performed at [facility], [place] [address], [place], [place] [address]      Time coordinating discharge: 76  SIGNED:   Cristy Folks, MD  [facility] 05/08/2018, 9:31 AM  If 7PM-7AM, please contact night-coverage [email or web address] Password [email or web address]

## 2018-05-10 DIAGNOSIS — Z23 Encounter for immunization: Secondary | ICD-10-CM | POA: Diagnosis not present

## 2018-05-10 DIAGNOSIS — N186 End stage renal disease: Secondary | ICD-10-CM | POA: Diagnosis not present

## 2018-05-10 DIAGNOSIS — N2581 Secondary hyperparathyroidism of renal origin: Secondary | ICD-10-CM | POA: Diagnosis not present

## 2018-05-10 DIAGNOSIS — D631 Anemia in chronic kidney disease: Secondary | ICD-10-CM | POA: Diagnosis not present

## 2018-05-10 DIAGNOSIS — Z992 Dependence on renal dialysis: Secondary | ICD-10-CM | POA: Diagnosis not present

## 2018-05-10 DIAGNOSIS — E1129 Type 2 diabetes mellitus with other diabetic kidney complication: Secondary | ICD-10-CM | POA: Diagnosis not present

## 2018-05-11 LAB — CULTURE, BLOOD (ROUTINE X 2)
CULTURE: NO GROWTH
CULTURE: NO GROWTH
Special Requests: ADEQUATE
Special Requests: ADEQUATE

## 2018-05-12 DIAGNOSIS — E1129 Type 2 diabetes mellitus with other diabetic kidney complication: Secondary | ICD-10-CM | POA: Diagnosis not present

## 2018-05-12 DIAGNOSIS — Z23 Encounter for immunization: Secondary | ICD-10-CM | POA: Diagnosis not present

## 2018-05-12 DIAGNOSIS — D631 Anemia in chronic kidney disease: Secondary | ICD-10-CM | POA: Diagnosis not present

## 2018-05-12 DIAGNOSIS — Z992 Dependence on renal dialysis: Secondary | ICD-10-CM | POA: Diagnosis not present

## 2018-05-12 DIAGNOSIS — N2581 Secondary hyperparathyroidism of renal origin: Secondary | ICD-10-CM | POA: Diagnosis not present

## 2018-05-12 DIAGNOSIS — N186 End stage renal disease: Secondary | ICD-10-CM | POA: Diagnosis not present

## 2018-05-12 DIAGNOSIS — E1142 Type 2 diabetes mellitus with diabetic polyneuropathy: Secondary | ICD-10-CM | POA: Diagnosis not present

## 2018-05-14 DIAGNOSIS — N2581 Secondary hyperparathyroidism of renal origin: Secondary | ICD-10-CM | POA: Diagnosis not present

## 2018-05-14 DIAGNOSIS — E1129 Type 2 diabetes mellitus with other diabetic kidney complication: Secondary | ICD-10-CM | POA: Diagnosis not present

## 2018-05-14 DIAGNOSIS — N186 End stage renal disease: Secondary | ICD-10-CM | POA: Diagnosis not present

## 2018-05-14 DIAGNOSIS — Z992 Dependence on renal dialysis: Secondary | ICD-10-CM | POA: Diagnosis not present

## 2018-05-14 DIAGNOSIS — Z23 Encounter for immunization: Secondary | ICD-10-CM | POA: Diagnosis not present

## 2018-05-14 DIAGNOSIS — D631 Anemia in chronic kidney disease: Secondary | ICD-10-CM | POA: Diagnosis not present

## 2018-05-17 DIAGNOSIS — Z992 Dependence on renal dialysis: Secondary | ICD-10-CM | POA: Diagnosis not present

## 2018-05-17 DIAGNOSIS — N2581 Secondary hyperparathyroidism of renal origin: Secondary | ICD-10-CM | POA: Diagnosis not present

## 2018-05-17 DIAGNOSIS — D631 Anemia in chronic kidney disease: Secondary | ICD-10-CM | POA: Diagnosis not present

## 2018-05-17 DIAGNOSIS — E1129 Type 2 diabetes mellitus with other diabetic kidney complication: Secondary | ICD-10-CM | POA: Diagnosis not present

## 2018-05-17 DIAGNOSIS — Z23 Encounter for immunization: Secondary | ICD-10-CM | POA: Diagnosis not present

## 2018-05-17 DIAGNOSIS — N186 End stage renal disease: Secondary | ICD-10-CM | POA: Diagnosis not present

## 2018-05-18 DIAGNOSIS — G609 Hereditary and idiopathic neuropathy, unspecified: Secondary | ICD-10-CM | POA: Diagnosis not present

## 2018-05-18 DIAGNOSIS — I10 Essential (primary) hypertension: Secondary | ICD-10-CM | POA: Diagnosis not present

## 2018-05-18 DIAGNOSIS — E1129 Type 2 diabetes mellitus with other diabetic kidney complication: Secondary | ICD-10-CM | POA: Diagnosis not present

## 2018-05-18 DIAGNOSIS — J189 Pneumonia, unspecified organism: Secondary | ICD-10-CM | POA: Diagnosis not present

## 2018-05-18 DIAGNOSIS — N186 End stage renal disease: Secondary | ICD-10-CM | POA: Diagnosis not present

## 2018-05-18 DIAGNOSIS — R042 Hemoptysis: Secondary | ICD-10-CM | POA: Diagnosis not present

## 2018-05-18 DIAGNOSIS — Z6833 Body mass index (BMI) 33.0-33.9, adult: Secondary | ICD-10-CM | POA: Diagnosis not present

## 2018-05-19 DIAGNOSIS — E1129 Type 2 diabetes mellitus with other diabetic kidney complication: Secondary | ICD-10-CM | POA: Diagnosis not present

## 2018-05-19 DIAGNOSIS — D631 Anemia in chronic kidney disease: Secondary | ICD-10-CM | POA: Diagnosis not present

## 2018-05-19 DIAGNOSIS — N186 End stage renal disease: Secondary | ICD-10-CM | POA: Diagnosis not present

## 2018-05-19 DIAGNOSIS — Z23 Encounter for immunization: Secondary | ICD-10-CM | POA: Diagnosis not present

## 2018-05-19 DIAGNOSIS — N2581 Secondary hyperparathyroidism of renal origin: Secondary | ICD-10-CM | POA: Diagnosis not present

## 2018-05-19 DIAGNOSIS — Z992 Dependence on renal dialysis: Secondary | ICD-10-CM | POA: Diagnosis not present

## 2018-05-21 DIAGNOSIS — Z992 Dependence on renal dialysis: Secondary | ICD-10-CM | POA: Diagnosis not present

## 2018-05-21 DIAGNOSIS — D631 Anemia in chronic kidney disease: Secondary | ICD-10-CM | POA: Diagnosis not present

## 2018-05-21 DIAGNOSIS — N2581 Secondary hyperparathyroidism of renal origin: Secondary | ICD-10-CM | POA: Diagnosis not present

## 2018-05-21 DIAGNOSIS — N186 End stage renal disease: Secondary | ICD-10-CM | POA: Diagnosis not present

## 2018-05-21 DIAGNOSIS — E1129 Type 2 diabetes mellitus with other diabetic kidney complication: Secondary | ICD-10-CM | POA: Diagnosis not present

## 2018-05-24 DIAGNOSIS — N2581 Secondary hyperparathyroidism of renal origin: Secondary | ICD-10-CM | POA: Diagnosis not present

## 2018-05-24 DIAGNOSIS — D631 Anemia in chronic kidney disease: Secondary | ICD-10-CM | POA: Diagnosis not present

## 2018-05-24 DIAGNOSIS — N186 End stage renal disease: Secondary | ICD-10-CM | POA: Diagnosis not present

## 2018-05-24 DIAGNOSIS — Z992 Dependence on renal dialysis: Secondary | ICD-10-CM | POA: Diagnosis not present

## 2018-05-24 DIAGNOSIS — E1129 Type 2 diabetes mellitus with other diabetic kidney complication: Secondary | ICD-10-CM | POA: Diagnosis not present

## 2018-05-26 DIAGNOSIS — D631 Anemia in chronic kidney disease: Secondary | ICD-10-CM | POA: Diagnosis not present

## 2018-05-26 DIAGNOSIS — E1129 Type 2 diabetes mellitus with other diabetic kidney complication: Secondary | ICD-10-CM | POA: Diagnosis not present

## 2018-05-26 DIAGNOSIS — Z992 Dependence on renal dialysis: Secondary | ICD-10-CM | POA: Diagnosis not present

## 2018-05-26 DIAGNOSIS — N2581 Secondary hyperparathyroidism of renal origin: Secondary | ICD-10-CM | POA: Diagnosis not present

## 2018-05-26 DIAGNOSIS — N186 End stage renal disease: Secondary | ICD-10-CM | POA: Diagnosis not present

## 2018-05-28 DIAGNOSIS — E1129 Type 2 diabetes mellitus with other diabetic kidney complication: Secondary | ICD-10-CM | POA: Diagnosis not present

## 2018-05-28 DIAGNOSIS — N2581 Secondary hyperparathyroidism of renal origin: Secondary | ICD-10-CM | POA: Diagnosis not present

## 2018-05-28 DIAGNOSIS — N186 End stage renal disease: Secondary | ICD-10-CM | POA: Diagnosis not present

## 2018-05-28 DIAGNOSIS — D631 Anemia in chronic kidney disease: Secondary | ICD-10-CM | POA: Diagnosis not present

## 2018-05-28 DIAGNOSIS — Z992 Dependence on renal dialysis: Secondary | ICD-10-CM | POA: Diagnosis not present

## 2018-05-31 DIAGNOSIS — Z992 Dependence on renal dialysis: Secondary | ICD-10-CM | POA: Diagnosis not present

## 2018-05-31 DIAGNOSIS — D631 Anemia in chronic kidney disease: Secondary | ICD-10-CM | POA: Diagnosis not present

## 2018-05-31 DIAGNOSIS — N186 End stage renal disease: Secondary | ICD-10-CM | POA: Diagnosis not present

## 2018-05-31 DIAGNOSIS — E1129 Type 2 diabetes mellitus with other diabetic kidney complication: Secondary | ICD-10-CM | POA: Diagnosis not present

## 2018-05-31 DIAGNOSIS — N2581 Secondary hyperparathyroidism of renal origin: Secondary | ICD-10-CM | POA: Diagnosis not present

## 2018-06-02 DIAGNOSIS — N2581 Secondary hyperparathyroidism of renal origin: Secondary | ICD-10-CM | POA: Diagnosis not present

## 2018-06-02 DIAGNOSIS — E1129 Type 2 diabetes mellitus with other diabetic kidney complication: Secondary | ICD-10-CM | POA: Diagnosis not present

## 2018-06-02 DIAGNOSIS — Z992 Dependence on renal dialysis: Secondary | ICD-10-CM | POA: Diagnosis not present

## 2018-06-02 DIAGNOSIS — N186 End stage renal disease: Secondary | ICD-10-CM | POA: Diagnosis not present

## 2018-06-02 DIAGNOSIS — D631 Anemia in chronic kidney disease: Secondary | ICD-10-CM | POA: Diagnosis not present

## 2018-06-04 DIAGNOSIS — D631 Anemia in chronic kidney disease: Secondary | ICD-10-CM | POA: Diagnosis not present

## 2018-06-04 DIAGNOSIS — Z992 Dependence on renal dialysis: Secondary | ICD-10-CM | POA: Diagnosis not present

## 2018-06-04 DIAGNOSIS — E1129 Type 2 diabetes mellitus with other diabetic kidney complication: Secondary | ICD-10-CM | POA: Diagnosis not present

## 2018-06-04 DIAGNOSIS — N2581 Secondary hyperparathyroidism of renal origin: Secondary | ICD-10-CM | POA: Diagnosis not present

## 2018-06-04 DIAGNOSIS — N186 End stage renal disease: Secondary | ICD-10-CM | POA: Diagnosis not present

## 2018-06-07 DIAGNOSIS — D631 Anemia in chronic kidney disease: Secondary | ICD-10-CM | POA: Diagnosis not present

## 2018-06-07 DIAGNOSIS — N2581 Secondary hyperparathyroidism of renal origin: Secondary | ICD-10-CM | POA: Diagnosis not present

## 2018-06-07 DIAGNOSIS — E1129 Type 2 diabetes mellitus with other diabetic kidney complication: Secondary | ICD-10-CM | POA: Diagnosis not present

## 2018-06-07 DIAGNOSIS — Z992 Dependence on renal dialysis: Secondary | ICD-10-CM | POA: Diagnosis not present

## 2018-06-07 DIAGNOSIS — N186 End stage renal disease: Secondary | ICD-10-CM | POA: Diagnosis not present

## 2018-06-09 DIAGNOSIS — N2581 Secondary hyperparathyroidism of renal origin: Secondary | ICD-10-CM | POA: Diagnosis not present

## 2018-06-09 DIAGNOSIS — E1129 Type 2 diabetes mellitus with other diabetic kidney complication: Secondary | ICD-10-CM | POA: Diagnosis not present

## 2018-06-09 DIAGNOSIS — Z992 Dependence on renal dialysis: Secondary | ICD-10-CM | POA: Diagnosis not present

## 2018-06-09 DIAGNOSIS — D631 Anemia in chronic kidney disease: Secondary | ICD-10-CM | POA: Diagnosis not present

## 2018-06-09 DIAGNOSIS — N186 End stage renal disease: Secondary | ICD-10-CM | POA: Diagnosis not present

## 2018-06-11 DIAGNOSIS — D631 Anemia in chronic kidney disease: Secondary | ICD-10-CM | POA: Diagnosis not present

## 2018-06-11 DIAGNOSIS — N186 End stage renal disease: Secondary | ICD-10-CM | POA: Diagnosis not present

## 2018-06-11 DIAGNOSIS — Z992 Dependence on renal dialysis: Secondary | ICD-10-CM | POA: Diagnosis not present

## 2018-06-11 DIAGNOSIS — E1129 Type 2 diabetes mellitus with other diabetic kidney complication: Secondary | ICD-10-CM | POA: Diagnosis not present

## 2018-06-11 DIAGNOSIS — N2581 Secondary hyperparathyroidism of renal origin: Secondary | ICD-10-CM | POA: Diagnosis not present

## 2018-06-13 DIAGNOSIS — D631 Anemia in chronic kidney disease: Secondary | ICD-10-CM | POA: Diagnosis not present

## 2018-06-13 DIAGNOSIS — N186 End stage renal disease: Secondary | ICD-10-CM | POA: Diagnosis not present

## 2018-06-13 DIAGNOSIS — Z992 Dependence on renal dialysis: Secondary | ICD-10-CM | POA: Diagnosis not present

## 2018-06-13 DIAGNOSIS — E1129 Type 2 diabetes mellitus with other diabetic kidney complication: Secondary | ICD-10-CM | POA: Diagnosis not present

## 2018-06-13 DIAGNOSIS — N2581 Secondary hyperparathyroidism of renal origin: Secondary | ICD-10-CM | POA: Diagnosis not present

## 2018-06-15 DIAGNOSIS — D631 Anemia in chronic kidney disease: Secondary | ICD-10-CM | POA: Diagnosis not present

## 2018-06-15 DIAGNOSIS — Z6833 Body mass index (BMI) 33.0-33.9, adult: Secondary | ICD-10-CM | POA: Diagnosis not present

## 2018-06-15 DIAGNOSIS — K922 Gastrointestinal hemorrhage, unspecified: Secondary | ICD-10-CM | POA: Diagnosis not present

## 2018-06-15 DIAGNOSIS — N2581 Secondary hyperparathyroidism of renal origin: Secondary | ICD-10-CM | POA: Diagnosis not present

## 2018-06-15 DIAGNOSIS — Z992 Dependence on renal dialysis: Secondary | ICD-10-CM | POA: Diagnosis not present

## 2018-06-15 DIAGNOSIS — I10 Essential (primary) hypertension: Secondary | ICD-10-CM | POA: Diagnosis not present

## 2018-06-15 DIAGNOSIS — E1129 Type 2 diabetes mellitus with other diabetic kidney complication: Secondary | ICD-10-CM | POA: Diagnosis not present

## 2018-06-15 DIAGNOSIS — J310 Chronic rhinitis: Secondary | ICD-10-CM | POA: Diagnosis not present

## 2018-06-15 DIAGNOSIS — Z1389 Encounter for screening for other disorder: Secondary | ICD-10-CM | POA: Diagnosis not present

## 2018-06-15 DIAGNOSIS — N186 End stage renal disease: Secondary | ICD-10-CM | POA: Diagnosis not present

## 2018-06-15 DIAGNOSIS — H540X55 Blindness right eye category 5, blindness left eye category 5: Secondary | ICD-10-CM | POA: Diagnosis not present

## 2018-06-18 DIAGNOSIS — Z992 Dependence on renal dialysis: Secondary | ICD-10-CM | POA: Diagnosis not present

## 2018-06-18 DIAGNOSIS — E1129 Type 2 diabetes mellitus with other diabetic kidney complication: Secondary | ICD-10-CM | POA: Diagnosis not present

## 2018-06-18 DIAGNOSIS — N186 End stage renal disease: Secondary | ICD-10-CM | POA: Diagnosis not present

## 2018-06-18 DIAGNOSIS — D631 Anemia in chronic kidney disease: Secondary | ICD-10-CM | POA: Diagnosis not present

## 2018-06-18 DIAGNOSIS — N2581 Secondary hyperparathyroidism of renal origin: Secondary | ICD-10-CM | POA: Diagnosis not present

## 2018-06-20 DIAGNOSIS — N186 End stage renal disease: Secondary | ICD-10-CM | POA: Diagnosis not present

## 2018-06-20 DIAGNOSIS — E1129 Type 2 diabetes mellitus with other diabetic kidney complication: Secondary | ICD-10-CM | POA: Diagnosis not present

## 2018-06-20 DIAGNOSIS — Z992 Dependence on renal dialysis: Secondary | ICD-10-CM | POA: Diagnosis not present

## 2018-06-21 DIAGNOSIS — Z992 Dependence on renal dialysis: Secondary | ICD-10-CM | POA: Diagnosis not present

## 2018-06-21 DIAGNOSIS — D631 Anemia in chronic kidney disease: Secondary | ICD-10-CM | POA: Diagnosis not present

## 2018-06-21 DIAGNOSIS — E1129 Type 2 diabetes mellitus with other diabetic kidney complication: Secondary | ICD-10-CM | POA: Diagnosis not present

## 2018-06-21 DIAGNOSIS — N186 End stage renal disease: Secondary | ICD-10-CM | POA: Diagnosis not present

## 2018-06-21 DIAGNOSIS — N2581 Secondary hyperparathyroidism of renal origin: Secondary | ICD-10-CM | POA: Diagnosis not present

## 2018-06-23 DIAGNOSIS — N186 End stage renal disease: Secondary | ICD-10-CM | POA: Diagnosis not present

## 2018-06-23 DIAGNOSIS — D631 Anemia in chronic kidney disease: Secondary | ICD-10-CM | POA: Diagnosis not present

## 2018-06-23 DIAGNOSIS — E1129 Type 2 diabetes mellitus with other diabetic kidney complication: Secondary | ICD-10-CM | POA: Diagnosis not present

## 2018-06-23 DIAGNOSIS — N2581 Secondary hyperparathyroidism of renal origin: Secondary | ICD-10-CM | POA: Diagnosis not present

## 2018-06-23 DIAGNOSIS — Z992 Dependence on renal dialysis: Secondary | ICD-10-CM | POA: Diagnosis not present

## 2018-06-25 DIAGNOSIS — N186 End stage renal disease: Secondary | ICD-10-CM | POA: Diagnosis not present

## 2018-06-25 DIAGNOSIS — Z992 Dependence on renal dialysis: Secondary | ICD-10-CM | POA: Diagnosis not present

## 2018-06-25 DIAGNOSIS — D631 Anemia in chronic kidney disease: Secondary | ICD-10-CM | POA: Diagnosis not present

## 2018-06-25 DIAGNOSIS — E1129 Type 2 diabetes mellitus with other diabetic kidney complication: Secondary | ICD-10-CM | POA: Diagnosis not present

## 2018-06-25 DIAGNOSIS — N2581 Secondary hyperparathyroidism of renal origin: Secondary | ICD-10-CM | POA: Diagnosis not present

## 2018-06-28 DIAGNOSIS — N2581 Secondary hyperparathyroidism of renal origin: Secondary | ICD-10-CM | POA: Diagnosis not present

## 2018-06-28 DIAGNOSIS — Z992 Dependence on renal dialysis: Secondary | ICD-10-CM | POA: Diagnosis not present

## 2018-06-28 DIAGNOSIS — N186 End stage renal disease: Secondary | ICD-10-CM | POA: Diagnosis not present

## 2018-06-28 DIAGNOSIS — D631 Anemia in chronic kidney disease: Secondary | ICD-10-CM | POA: Diagnosis not present

## 2018-06-28 DIAGNOSIS — E1129 Type 2 diabetes mellitus with other diabetic kidney complication: Secondary | ICD-10-CM | POA: Diagnosis not present

## 2018-06-30 DIAGNOSIS — Z992 Dependence on renal dialysis: Secondary | ICD-10-CM | POA: Diagnosis not present

## 2018-06-30 DIAGNOSIS — N186 End stage renal disease: Secondary | ICD-10-CM | POA: Diagnosis not present

## 2018-06-30 DIAGNOSIS — N2581 Secondary hyperparathyroidism of renal origin: Secondary | ICD-10-CM | POA: Diagnosis not present

## 2018-06-30 DIAGNOSIS — D631 Anemia in chronic kidney disease: Secondary | ICD-10-CM | POA: Diagnosis not present

## 2018-06-30 DIAGNOSIS — E1129 Type 2 diabetes mellitus with other diabetic kidney complication: Secondary | ICD-10-CM | POA: Diagnosis not present

## 2018-07-02 DIAGNOSIS — E1129 Type 2 diabetes mellitus with other diabetic kidney complication: Secondary | ICD-10-CM | POA: Diagnosis not present

## 2018-07-02 DIAGNOSIS — D631 Anemia in chronic kidney disease: Secondary | ICD-10-CM | POA: Diagnosis not present

## 2018-07-02 DIAGNOSIS — Z992 Dependence on renal dialysis: Secondary | ICD-10-CM | POA: Diagnosis not present

## 2018-07-02 DIAGNOSIS — N2581 Secondary hyperparathyroidism of renal origin: Secondary | ICD-10-CM | POA: Diagnosis not present

## 2018-07-02 DIAGNOSIS — N186 End stage renal disease: Secondary | ICD-10-CM | POA: Diagnosis not present

## 2018-07-05 DIAGNOSIS — Z992 Dependence on renal dialysis: Secondary | ICD-10-CM | POA: Diagnosis not present

## 2018-07-05 DIAGNOSIS — N2581 Secondary hyperparathyroidism of renal origin: Secondary | ICD-10-CM | POA: Diagnosis not present

## 2018-07-05 DIAGNOSIS — N186 End stage renal disease: Secondary | ICD-10-CM | POA: Diagnosis not present

## 2018-07-05 DIAGNOSIS — E1129 Type 2 diabetes mellitus with other diabetic kidney complication: Secondary | ICD-10-CM | POA: Diagnosis not present

## 2018-07-05 DIAGNOSIS — D631 Anemia in chronic kidney disease: Secondary | ICD-10-CM | POA: Diagnosis not present

## 2018-07-07 DIAGNOSIS — D631 Anemia in chronic kidney disease: Secondary | ICD-10-CM | POA: Diagnosis not present

## 2018-07-07 DIAGNOSIS — N186 End stage renal disease: Secondary | ICD-10-CM | POA: Diagnosis not present

## 2018-07-07 DIAGNOSIS — E1129 Type 2 diabetes mellitus with other diabetic kidney complication: Secondary | ICD-10-CM | POA: Diagnosis not present

## 2018-07-07 DIAGNOSIS — Z992 Dependence on renal dialysis: Secondary | ICD-10-CM | POA: Diagnosis not present

## 2018-07-07 DIAGNOSIS — N2581 Secondary hyperparathyroidism of renal origin: Secondary | ICD-10-CM | POA: Diagnosis not present

## 2018-07-09 DIAGNOSIS — D631 Anemia in chronic kidney disease: Secondary | ICD-10-CM | POA: Diagnosis not present

## 2018-07-09 DIAGNOSIS — N186 End stage renal disease: Secondary | ICD-10-CM | POA: Diagnosis not present

## 2018-07-09 DIAGNOSIS — Z992 Dependence on renal dialysis: Secondary | ICD-10-CM | POA: Diagnosis not present

## 2018-07-09 DIAGNOSIS — N2581 Secondary hyperparathyroidism of renal origin: Secondary | ICD-10-CM | POA: Diagnosis not present

## 2018-07-09 DIAGNOSIS — E1129 Type 2 diabetes mellitus with other diabetic kidney complication: Secondary | ICD-10-CM | POA: Diagnosis not present

## 2018-07-11 DIAGNOSIS — Z992 Dependence on renal dialysis: Secondary | ICD-10-CM | POA: Diagnosis not present

## 2018-07-11 DIAGNOSIS — N2581 Secondary hyperparathyroidism of renal origin: Secondary | ICD-10-CM | POA: Diagnosis not present

## 2018-07-11 DIAGNOSIS — E1129 Type 2 diabetes mellitus with other diabetic kidney complication: Secondary | ICD-10-CM | POA: Diagnosis not present

## 2018-07-11 DIAGNOSIS — N186 End stage renal disease: Secondary | ICD-10-CM | POA: Diagnosis not present

## 2018-07-11 DIAGNOSIS — D631 Anemia in chronic kidney disease: Secondary | ICD-10-CM | POA: Diagnosis not present

## 2018-07-13 DIAGNOSIS — D631 Anemia in chronic kidney disease: Secondary | ICD-10-CM | POA: Diagnosis not present

## 2018-07-13 DIAGNOSIS — N186 End stage renal disease: Secondary | ICD-10-CM | POA: Diagnosis not present

## 2018-07-13 DIAGNOSIS — Z992 Dependence on renal dialysis: Secondary | ICD-10-CM | POA: Diagnosis not present

## 2018-07-13 DIAGNOSIS — N2581 Secondary hyperparathyroidism of renal origin: Secondary | ICD-10-CM | POA: Diagnosis not present

## 2018-07-13 DIAGNOSIS — E1129 Type 2 diabetes mellitus with other diabetic kidney complication: Secondary | ICD-10-CM | POA: Diagnosis not present

## 2018-07-16 DIAGNOSIS — N2581 Secondary hyperparathyroidism of renal origin: Secondary | ICD-10-CM | POA: Diagnosis not present

## 2018-07-16 DIAGNOSIS — Z992 Dependence on renal dialysis: Secondary | ICD-10-CM | POA: Diagnosis not present

## 2018-07-16 DIAGNOSIS — E1129 Type 2 diabetes mellitus with other diabetic kidney complication: Secondary | ICD-10-CM | POA: Diagnosis not present

## 2018-07-16 DIAGNOSIS — N186 End stage renal disease: Secondary | ICD-10-CM | POA: Diagnosis not present

## 2018-07-16 DIAGNOSIS — D631 Anemia in chronic kidney disease: Secondary | ICD-10-CM | POA: Diagnosis not present

## 2018-07-18 DIAGNOSIS — E1129 Type 2 diabetes mellitus with other diabetic kidney complication: Secondary | ICD-10-CM | POA: Diagnosis not present

## 2018-07-18 DIAGNOSIS — Z992 Dependence on renal dialysis: Secondary | ICD-10-CM | POA: Diagnosis not present

## 2018-07-18 DIAGNOSIS — D631 Anemia in chronic kidney disease: Secondary | ICD-10-CM | POA: Diagnosis not present

## 2018-07-18 DIAGNOSIS — N186 End stage renal disease: Secondary | ICD-10-CM | POA: Diagnosis not present

## 2018-07-18 DIAGNOSIS — N2581 Secondary hyperparathyroidism of renal origin: Secondary | ICD-10-CM | POA: Diagnosis not present

## 2018-07-20 DIAGNOSIS — D631 Anemia in chronic kidney disease: Secondary | ICD-10-CM | POA: Diagnosis not present

## 2018-07-20 DIAGNOSIS — N2581 Secondary hyperparathyroidism of renal origin: Secondary | ICD-10-CM | POA: Diagnosis not present

## 2018-07-20 DIAGNOSIS — E1129 Type 2 diabetes mellitus with other diabetic kidney complication: Secondary | ICD-10-CM | POA: Diagnosis not present

## 2018-07-20 DIAGNOSIS — Z992 Dependence on renal dialysis: Secondary | ICD-10-CM | POA: Diagnosis not present

## 2018-07-20 DIAGNOSIS — N186 End stage renal disease: Secondary | ICD-10-CM | POA: Diagnosis not present

## 2018-07-21 DIAGNOSIS — E1129 Type 2 diabetes mellitus with other diabetic kidney complication: Secondary | ICD-10-CM | POA: Diagnosis not present

## 2018-07-21 DIAGNOSIS — N186 End stage renal disease: Secondary | ICD-10-CM | POA: Diagnosis not present

## 2018-07-21 DIAGNOSIS — Z992 Dependence on renal dialysis: Secondary | ICD-10-CM | POA: Diagnosis not present

## 2018-07-23 DIAGNOSIS — N2581 Secondary hyperparathyroidism of renal origin: Secondary | ICD-10-CM | POA: Diagnosis not present

## 2018-07-23 DIAGNOSIS — D631 Anemia in chronic kidney disease: Secondary | ICD-10-CM | POA: Diagnosis not present

## 2018-07-23 DIAGNOSIS — N186 End stage renal disease: Secondary | ICD-10-CM | POA: Diagnosis not present

## 2018-07-23 DIAGNOSIS — Z992 Dependence on renal dialysis: Secondary | ICD-10-CM | POA: Diagnosis not present

## 2018-07-23 DIAGNOSIS — E1129 Type 2 diabetes mellitus with other diabetic kidney complication: Secondary | ICD-10-CM | POA: Diagnosis not present

## 2018-07-26 DIAGNOSIS — N186 End stage renal disease: Secondary | ICD-10-CM | POA: Diagnosis not present

## 2018-07-26 DIAGNOSIS — Z992 Dependence on renal dialysis: Secondary | ICD-10-CM | POA: Diagnosis not present

## 2018-07-26 DIAGNOSIS — D631 Anemia in chronic kidney disease: Secondary | ICD-10-CM | POA: Diagnosis not present

## 2018-07-26 DIAGNOSIS — E1129 Type 2 diabetes mellitus with other diabetic kidney complication: Secondary | ICD-10-CM | POA: Diagnosis not present

## 2018-07-26 DIAGNOSIS — N2581 Secondary hyperparathyroidism of renal origin: Secondary | ICD-10-CM | POA: Diagnosis not present

## 2018-07-28 DIAGNOSIS — N186 End stage renal disease: Secondary | ICD-10-CM | POA: Diagnosis not present

## 2018-07-28 DIAGNOSIS — E1129 Type 2 diabetes mellitus with other diabetic kidney complication: Secondary | ICD-10-CM | POA: Diagnosis not present

## 2018-07-28 DIAGNOSIS — Z992 Dependence on renal dialysis: Secondary | ICD-10-CM | POA: Diagnosis not present

## 2018-07-28 DIAGNOSIS — D631 Anemia in chronic kidney disease: Secondary | ICD-10-CM | POA: Diagnosis not present

## 2018-07-28 DIAGNOSIS — N2581 Secondary hyperparathyroidism of renal origin: Secondary | ICD-10-CM | POA: Diagnosis not present

## 2018-07-30 DIAGNOSIS — N186 End stage renal disease: Secondary | ICD-10-CM | POA: Diagnosis not present

## 2018-07-30 DIAGNOSIS — D631 Anemia in chronic kidney disease: Secondary | ICD-10-CM | POA: Diagnosis not present

## 2018-07-30 DIAGNOSIS — Z992 Dependence on renal dialysis: Secondary | ICD-10-CM | POA: Diagnosis not present

## 2018-07-30 DIAGNOSIS — N2581 Secondary hyperparathyroidism of renal origin: Secondary | ICD-10-CM | POA: Diagnosis not present

## 2018-07-30 DIAGNOSIS — E1129 Type 2 diabetes mellitus with other diabetic kidney complication: Secondary | ICD-10-CM | POA: Diagnosis not present

## 2018-08-02 DIAGNOSIS — Z992 Dependence on renal dialysis: Secondary | ICD-10-CM | POA: Diagnosis not present

## 2018-08-02 DIAGNOSIS — E1129 Type 2 diabetes mellitus with other diabetic kidney complication: Secondary | ICD-10-CM | POA: Diagnosis not present

## 2018-08-02 DIAGNOSIS — D631 Anemia in chronic kidney disease: Secondary | ICD-10-CM | POA: Diagnosis not present

## 2018-08-02 DIAGNOSIS — N2581 Secondary hyperparathyroidism of renal origin: Secondary | ICD-10-CM | POA: Diagnosis not present

## 2018-08-02 DIAGNOSIS — N186 End stage renal disease: Secondary | ICD-10-CM | POA: Diagnosis not present

## 2018-08-04 DIAGNOSIS — N186 End stage renal disease: Secondary | ICD-10-CM | POA: Diagnosis not present

## 2018-08-04 DIAGNOSIS — E1129 Type 2 diabetes mellitus with other diabetic kidney complication: Secondary | ICD-10-CM | POA: Diagnosis not present

## 2018-08-04 DIAGNOSIS — N2581 Secondary hyperparathyroidism of renal origin: Secondary | ICD-10-CM | POA: Diagnosis not present

## 2018-08-04 DIAGNOSIS — D631 Anemia in chronic kidney disease: Secondary | ICD-10-CM | POA: Diagnosis not present

## 2018-08-04 DIAGNOSIS — Z992 Dependence on renal dialysis: Secondary | ICD-10-CM | POA: Diagnosis not present

## 2018-08-06 DIAGNOSIS — Z992 Dependence on renal dialysis: Secondary | ICD-10-CM | POA: Diagnosis not present

## 2018-08-06 DIAGNOSIS — D631 Anemia in chronic kidney disease: Secondary | ICD-10-CM | POA: Diagnosis not present

## 2018-08-06 DIAGNOSIS — N186 End stage renal disease: Secondary | ICD-10-CM | POA: Diagnosis not present

## 2018-08-06 DIAGNOSIS — N2581 Secondary hyperparathyroidism of renal origin: Secondary | ICD-10-CM | POA: Diagnosis not present

## 2018-08-06 DIAGNOSIS — E1129 Type 2 diabetes mellitus with other diabetic kidney complication: Secondary | ICD-10-CM | POA: Diagnosis not present

## 2018-08-09 DIAGNOSIS — N2581 Secondary hyperparathyroidism of renal origin: Secondary | ICD-10-CM | POA: Diagnosis not present

## 2018-08-09 DIAGNOSIS — D631 Anemia in chronic kidney disease: Secondary | ICD-10-CM | POA: Diagnosis not present

## 2018-08-09 DIAGNOSIS — E1129 Type 2 diabetes mellitus with other diabetic kidney complication: Secondary | ICD-10-CM | POA: Diagnosis not present

## 2018-08-09 DIAGNOSIS — N186 End stage renal disease: Secondary | ICD-10-CM | POA: Diagnosis not present

## 2018-08-09 DIAGNOSIS — Z992 Dependence on renal dialysis: Secondary | ICD-10-CM | POA: Diagnosis not present

## 2018-08-11 DIAGNOSIS — N186 End stage renal disease: Secondary | ICD-10-CM | POA: Diagnosis not present

## 2018-08-11 DIAGNOSIS — D631 Anemia in chronic kidney disease: Secondary | ICD-10-CM | POA: Diagnosis not present

## 2018-08-11 DIAGNOSIS — N2581 Secondary hyperparathyroidism of renal origin: Secondary | ICD-10-CM | POA: Diagnosis not present

## 2018-08-11 DIAGNOSIS — Z992 Dependence on renal dialysis: Secondary | ICD-10-CM | POA: Diagnosis not present

## 2018-08-11 DIAGNOSIS — E1142 Type 2 diabetes mellitus with diabetic polyneuropathy: Secondary | ICD-10-CM | POA: Diagnosis not present

## 2018-08-11 DIAGNOSIS — E1129 Type 2 diabetes mellitus with other diabetic kidney complication: Secondary | ICD-10-CM | POA: Diagnosis not present

## 2018-08-13 DIAGNOSIS — N2581 Secondary hyperparathyroidism of renal origin: Secondary | ICD-10-CM | POA: Diagnosis not present

## 2018-08-13 DIAGNOSIS — D631 Anemia in chronic kidney disease: Secondary | ICD-10-CM | POA: Diagnosis not present

## 2018-08-13 DIAGNOSIS — E1129 Type 2 diabetes mellitus with other diabetic kidney complication: Secondary | ICD-10-CM | POA: Diagnosis not present

## 2018-08-13 DIAGNOSIS — N186 End stage renal disease: Secondary | ICD-10-CM | POA: Diagnosis not present

## 2018-08-13 DIAGNOSIS — Z992 Dependence on renal dialysis: Secondary | ICD-10-CM | POA: Diagnosis not present

## 2018-08-16 DIAGNOSIS — D631 Anemia in chronic kidney disease: Secondary | ICD-10-CM | POA: Diagnosis not present

## 2018-08-16 DIAGNOSIS — Z992 Dependence on renal dialysis: Secondary | ICD-10-CM | POA: Diagnosis not present

## 2018-08-16 DIAGNOSIS — N2581 Secondary hyperparathyroidism of renal origin: Secondary | ICD-10-CM | POA: Diagnosis not present

## 2018-08-16 DIAGNOSIS — E1129 Type 2 diabetes mellitus with other diabetic kidney complication: Secondary | ICD-10-CM | POA: Diagnosis not present

## 2018-08-16 DIAGNOSIS — N186 End stage renal disease: Secondary | ICD-10-CM | POA: Diagnosis not present

## 2018-08-18 DIAGNOSIS — D631 Anemia in chronic kidney disease: Secondary | ICD-10-CM | POA: Diagnosis not present

## 2018-08-18 DIAGNOSIS — Z992 Dependence on renal dialysis: Secondary | ICD-10-CM | POA: Diagnosis not present

## 2018-08-18 DIAGNOSIS — N2581 Secondary hyperparathyroidism of renal origin: Secondary | ICD-10-CM | POA: Diagnosis not present

## 2018-08-18 DIAGNOSIS — N186 End stage renal disease: Secondary | ICD-10-CM | POA: Diagnosis not present

## 2018-08-18 DIAGNOSIS — E1129 Type 2 diabetes mellitus with other diabetic kidney complication: Secondary | ICD-10-CM | POA: Diagnosis not present

## 2018-08-20 DIAGNOSIS — N186 End stage renal disease: Secondary | ICD-10-CM | POA: Diagnosis not present

## 2018-08-20 DIAGNOSIS — E1129 Type 2 diabetes mellitus with other diabetic kidney complication: Secondary | ICD-10-CM | POA: Diagnosis not present

## 2018-08-20 DIAGNOSIS — N2581 Secondary hyperparathyroidism of renal origin: Secondary | ICD-10-CM | POA: Diagnosis not present

## 2018-08-20 DIAGNOSIS — Z992 Dependence on renal dialysis: Secondary | ICD-10-CM | POA: Diagnosis not present

## 2018-08-20 DIAGNOSIS — D631 Anemia in chronic kidney disease: Secondary | ICD-10-CM | POA: Diagnosis not present

## 2018-08-21 DIAGNOSIS — Z992 Dependence on renal dialysis: Secondary | ICD-10-CM | POA: Diagnosis not present

## 2018-08-21 DIAGNOSIS — N186 End stage renal disease: Secondary | ICD-10-CM | POA: Diagnosis not present

## 2018-08-21 DIAGNOSIS — E1129 Type 2 diabetes mellitus with other diabetic kidney complication: Secondary | ICD-10-CM | POA: Diagnosis not present

## 2018-08-23 DIAGNOSIS — N186 End stage renal disease: Secondary | ICD-10-CM | POA: Diagnosis not present

## 2018-08-23 DIAGNOSIS — D631 Anemia in chronic kidney disease: Secondary | ICD-10-CM | POA: Diagnosis not present

## 2018-08-23 DIAGNOSIS — Z992 Dependence on renal dialysis: Secondary | ICD-10-CM | POA: Diagnosis not present

## 2018-08-23 DIAGNOSIS — N2581 Secondary hyperparathyroidism of renal origin: Secondary | ICD-10-CM | POA: Diagnosis not present

## 2018-08-23 DIAGNOSIS — E1129 Type 2 diabetes mellitus with other diabetic kidney complication: Secondary | ICD-10-CM | POA: Diagnosis not present

## 2018-08-25 DIAGNOSIS — N186 End stage renal disease: Secondary | ICD-10-CM | POA: Diagnosis not present

## 2018-08-25 DIAGNOSIS — D631 Anemia in chronic kidney disease: Secondary | ICD-10-CM | POA: Diagnosis not present

## 2018-08-25 DIAGNOSIS — Z992 Dependence on renal dialysis: Secondary | ICD-10-CM | POA: Diagnosis not present

## 2018-08-25 DIAGNOSIS — E1129 Type 2 diabetes mellitus with other diabetic kidney complication: Secondary | ICD-10-CM | POA: Diagnosis not present

## 2018-08-25 DIAGNOSIS — N2581 Secondary hyperparathyroidism of renal origin: Secondary | ICD-10-CM | POA: Diagnosis not present

## 2018-08-27 DIAGNOSIS — N186 End stage renal disease: Secondary | ICD-10-CM | POA: Diagnosis not present

## 2018-08-27 DIAGNOSIS — E1129 Type 2 diabetes mellitus with other diabetic kidney complication: Secondary | ICD-10-CM | POA: Diagnosis not present

## 2018-08-27 DIAGNOSIS — D631 Anemia in chronic kidney disease: Secondary | ICD-10-CM | POA: Diagnosis not present

## 2018-08-27 DIAGNOSIS — Z992 Dependence on renal dialysis: Secondary | ICD-10-CM | POA: Diagnosis not present

## 2018-08-27 DIAGNOSIS — N2581 Secondary hyperparathyroidism of renal origin: Secondary | ICD-10-CM | POA: Diagnosis not present

## 2018-08-30 DIAGNOSIS — D631 Anemia in chronic kidney disease: Secondary | ICD-10-CM | POA: Diagnosis not present

## 2018-08-30 DIAGNOSIS — N2581 Secondary hyperparathyroidism of renal origin: Secondary | ICD-10-CM | POA: Diagnosis not present

## 2018-08-30 DIAGNOSIS — E1129 Type 2 diabetes mellitus with other diabetic kidney complication: Secondary | ICD-10-CM | POA: Diagnosis not present

## 2018-08-30 DIAGNOSIS — Z992 Dependence on renal dialysis: Secondary | ICD-10-CM | POA: Diagnosis not present

## 2018-08-30 DIAGNOSIS — N186 End stage renal disease: Secondary | ICD-10-CM | POA: Diagnosis not present

## 2018-09-01 DIAGNOSIS — D631 Anemia in chronic kidney disease: Secondary | ICD-10-CM | POA: Diagnosis not present

## 2018-09-01 DIAGNOSIS — N2581 Secondary hyperparathyroidism of renal origin: Secondary | ICD-10-CM | POA: Diagnosis not present

## 2018-09-01 DIAGNOSIS — Z992 Dependence on renal dialysis: Secondary | ICD-10-CM | POA: Diagnosis not present

## 2018-09-01 DIAGNOSIS — N186 End stage renal disease: Secondary | ICD-10-CM | POA: Diagnosis not present

## 2018-09-01 DIAGNOSIS — E1129 Type 2 diabetes mellitus with other diabetic kidney complication: Secondary | ICD-10-CM | POA: Diagnosis not present

## 2018-09-03 DIAGNOSIS — N186 End stage renal disease: Secondary | ICD-10-CM | POA: Diagnosis not present

## 2018-09-03 DIAGNOSIS — D631 Anemia in chronic kidney disease: Secondary | ICD-10-CM | POA: Diagnosis not present

## 2018-09-03 DIAGNOSIS — Z992 Dependence on renal dialysis: Secondary | ICD-10-CM | POA: Diagnosis not present

## 2018-09-03 DIAGNOSIS — N2581 Secondary hyperparathyroidism of renal origin: Secondary | ICD-10-CM | POA: Diagnosis not present

## 2018-09-03 DIAGNOSIS — E1129 Type 2 diabetes mellitus with other diabetic kidney complication: Secondary | ICD-10-CM | POA: Diagnosis not present

## 2018-09-06 ENCOUNTER — Encounter: Payer: Self-pay | Admitting: Surgery

## 2018-09-06 ENCOUNTER — Encounter: Payer: Self-pay | Admitting: *Deleted

## 2018-09-06 ENCOUNTER — Ambulatory Visit (INDEPENDENT_AMBULATORY_CARE_PROVIDER_SITE_OTHER): Payer: Medicare Other | Admitting: Surgery

## 2018-09-06 ENCOUNTER — Other Ambulatory Visit: Payer: Self-pay

## 2018-09-06 ENCOUNTER — Other Ambulatory Visit: Payer: Self-pay | Admitting: *Deleted

## 2018-09-06 VITALS — BP 104/63 | HR 88 | Temp 97.3°F | Resp 16 | Ht 65.5 in | Wt 199.5 lb

## 2018-09-06 DIAGNOSIS — D631 Anemia in chronic kidney disease: Secondary | ICD-10-CM | POA: Diagnosis not present

## 2018-09-06 DIAGNOSIS — Z992 Dependence on renal dialysis: Secondary | ICD-10-CM | POA: Diagnosis not present

## 2018-09-06 DIAGNOSIS — N2581 Secondary hyperparathyroidism of renal origin: Secondary | ICD-10-CM | POA: Diagnosis not present

## 2018-09-06 DIAGNOSIS — N186 End stage renal disease: Secondary | ICD-10-CM

## 2018-09-06 DIAGNOSIS — E1129 Type 2 diabetes mellitus with other diabetic kidney complication: Secondary | ICD-10-CM | POA: Diagnosis not present

## 2018-09-06 NOTE — H&P (View-Only) (Signed)
[facility] [place]  Patient name: [name] MRN: [id number] DOB: Sep 03, 1950 Sex: female   REASON FOR VISIT:    ESRD  HISOTRY OF PRESENT ILLNESS:    [name] is a 68 y.o. female who is here today for evaluation of her right arm fistula.  She states that she does have some prolonged bleeding after dialysis.  There was concern over some thinning of the skin and scabs over her incision. she dialyzes on Monday Wednesday Friday.   PAST MEDICAL HISTORY:   Past Medical History:  Diagnosis Date  . Anemia   . Anxiety   . Arthritis    "joints" (06/15/2013)  . Asthma   . 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 Blood transfusion without reported diagnosis   . Breast cancer ([place])    left  . CHF (congestive heart failure) ([place])   . 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 Esophageal ulcer with bleeding   . ESRD (end stage renal disease) ([place])    "suppose to start dialysis today" (06/15/2013)  . GERD (gastroesophageal reflux disease)   . Glaucoma    blind in both eyes  . Heart murmur   . Hx of adenomatous colonic polyps 04/07/2018  . Hypertension   . Myalgia 12/31/2011  . Neuropathy 12/31/2011  . Shortness of breath    "when she doesn't go to dialysis"  . Type II diabetes mellitus ([place])      FAMILY HISTORY:   Family History  Problem Relation Age of Onset  . Diabetes Mother   . Hyperlipidemia Mother   . Hypertension Mother   . Hypertension Father   . Diabetes Sister   . Diabetes Brother   . Hypertension Brother   . Heart attack Brother   . Kidney disease Brother   . Colon cancer Neg Hx   . Colon polyps Neg Hx   . Esophageal cancer Neg Hx   . Gallbladder disease Neg Hx   . Rectal cancer Neg Hx   . Stomach cancer Neg Hx     SOCIAL HISTORY:   Social History   Tobacco Use  . Smoking status: Never Smoker  . Smokeless tobacco: Never Used  Substance Use Topics  . Alcohol  use: No     ALLERGIES:   Allergies  Allergen Reactions  . Latex Hives  . Oxycodone Other (See Comments)    Hallucinations   . Tramadol Other (See Comments)    hallucinations   . Tape Rash    14M Transpore adhesive tape. Medical tape  . Vicodin [Hydrocodone-Acetaminophen] Other (See Comments)    hallucinations     CURRENT MEDICATIONS:   Current Outpatient Medications  Medication Sig Dispense Refill  . acetaminophen (TYLENOL) 500 MG tablet Take 500 mg by mouth 2 (two) times daily.    Marland Kitchen albuterol (PROVENTIL) (2.5 MG/14ML) 0.083% nebulizer solution Take 3 mLs (2.5 mg total) by nebulization every 6 (six) hours as needed for wheezing or shortness of breath. 75 mL 12  . amLODipine (NORVASC) 10 MG tablet Take 10 mg at bedtime by mouth.  0  . aspirin 81 MG chewable tablet Chew 81 mg by mouth daily with lunch.     Marland Kitchen atropine 1 % ophthalmic solution Place 1 drop into the right eye 2 (two) times daily.  0  . B Complex-C-Folic Acid (RENA-VITE RX) 1 MG TABS Take 1 tablet by mouth daily with lunch.   1  .  carvedilol (COREG) 25 MG tablet Take 25 mg by mouth 2 (two) times daily.  2  . darbepoetin (ARANESP) 200 MCG/0.4ML SOLN injection Inject 0.4 mLs (200 mcg total) into the vein every Wednesday with hemodialysis. 1.68 mL   . doxercalciferol (HECTOROL) 4 MCG/2ML injection Inject 0.5 mLs (1 mcg total) into the vein every Monday, Wednesday, and Friday with hemodialysis. 2 mL   . gabapentin (NEURONTIN) 100 MG capsule Take 100 mg by mouth 2 (two) times daily.    . insulin aspart (NOVOLOG FLEXPEN) 100 UNIT/ML FlexPen Inject 8-10 Units into the skin 3 (three) times daily with meals. per Sliding scale    . lanthanum (FOSRENOL) 1000 MG chewable tablet Chew 2,000 mg by mouth 3 (three) times daily with meals.     Marland Kitchen lisinopril (PRINIVIL,ZESTRIL) 40 MG tablet Take 40 mg by mouth at bedtime.     Marland Kitchen omeprazole (PRILOSEC) 40 MG capsule Take 1 capsule (40 mg total) by mouth daily.    [name] test strip 3  (three) times daily. for testing  9  . prednisoLONE acetate (PRED FORTE) 1 % ophthalmic suspension Place 1 drop into the right eye 2 (two) times daily.   0   Current Facility-Administered Medications  Medication Dose Route Frequency Provider Last Rate Last Dose  . 0.9 %  sodium chloride infusion  500 mL Intravenous Once [name], MD        REVIEW OF SYSTEMS:   [X]  denotes positive finding, [ ]  denotes negative finding Cardiac  Comments:  Chest pain or chest pressure:    Shortness of breath upon exertion:    Short of breath when lying flat:    Irregular heart rhythm:        Vascular    Pain in calf, thigh, or hip brought on by ambulation:    Pain in feet at night that wakes you up from your sleep:     Blood clot in your veins:    Leg swelling:         Pulmonary    Oxygen at home:    Productive cough:     Wheezing:         Neurologic    Sudden weakness in arms or legs:     Sudden numbness in arms or legs:     Sudden onset of difficulty speaking or slurred speech:    Temporary loss of vision in one eye:     Problems with dizziness:         Gastrointestinal    Blood in stool:     Vomited blood:         Genitourinary    Burning when urinating:     Blood in urine:        Psychiatric    Major depression:         Hematologic    Bleeding problems:    Problems with blood clotting too easily:        Skin    Rashes or ulcers:        Constitutional    Fever or chills:      PHYSICAL EXAM:   Vitals:   09/06/18 1446  BP: 104/63  Pulse: 88  Resp: 16  Temp: (!) 97.3 F (36.3 C)  TempSrc: Oral  SpO2: 95%  Weight: 199 lb 8.3 oz (90.5 kg)  Height: 5' 5.5" (1.664 m)    GENERAL: The patient is a well-nourished female, in no acute distress. The vital signs are documented above.  CARDIAC: There is a regular rate and rhythm.  VASCULAR: Aneurysmal fistula.  No active ulcerations. PULMONARY: Non-labored respirations  MUSCULOSKELETAL: There are no major  deformities or cyanosis. NEUROLOGIC: No focal weakness or paresthesias are detected. SKIN: There are no ulcers or rashes noted. PSYCHIATRIC: The patient has a normal affect.  STUDIES:   None  MEDICAL ISSUES:   I have recommended that we proceed with a right arm fistulogram to evaluate her for central stenosis.  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  She does have some hypopigmented skin which has thinned out.  There were no active ulcers.  I will see what her fistulogram shows with regards to stenosis before making definitive recommendations.  This is been scheduled for Tuesday March 3    Annamarie Major, MD [facility] of [facility] [phone] Pager [phone]

## 2018-09-06 NOTE — Progress Notes (Signed)
Vascular and Vein Specialist of   Patient name: [name] MRN: [id number] DOB: 1951/07/04 Sex: female   REASON FOR VISIT:    ESRD  HISOTRY OF PRESENT ILLNESS:    [name] is a 67 y.o. female who is here today for evaluation of her right arm fistula.  She states that she does have some prolonged bleeding after dialysis.  There was concern over some thinning of the skin and scabs over her incision. she dialyzes on Monday Wednesday Friday.   PAST MEDICAL HISTORY:   Past Medical History:  Diagnosis Date  . Anemia   . Anxiety   . Arthritis    "joints" (06/15/2013)  . Asthma   . 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 Blood transfusion without reported diagnosis   . Breast cancer ([place])    left  . CHF (congestive heart failure) ([place])   . 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 Esophageal ulcer with bleeding   . ESRD (end stage renal disease) ([place])    "suppose to start dialysis today" (06/15/2013)  . GERD (gastroesophageal reflux disease)   . Glaucoma    blind in both eyes  . Heart murmur   . Hx of adenomatous colonic polyps 04/07/2018  . Hypertension   . Myalgia 12/31/2011  . Neuropathy 12/31/2011  . Shortness of breath    "when she doesn't go to dialysis"  . Type II diabetes mellitus ([place])      FAMILY HISTORY:   Family History  Problem Relation Age of Onset  . Diabetes Mother   . Hyperlipidemia Mother   . Hypertension Mother   . Hypertension Father   . Diabetes Sister   . Diabetes Brother   . Hypertension Brother   . Heart attack Brother   . Kidney disease Brother   . Colon cancer Neg Hx   . Colon polyps Neg Hx   . Esophageal cancer Neg Hx   . Gallbladder disease Neg Hx   . Rectal cancer Neg Hx   . Stomach cancer Neg Hx     SOCIAL HISTORY:   Social History   Tobacco Use  . Smoking status: Never Smoker  . Smokeless tobacco: Never Used  Substance Use Topics  . Alcohol  use: No     ALLERGIES:   Allergies  Allergen Reactions  . Latex Hives  . Oxycodone Other (See Comments)    Hallucinations   . Tramadol Other (See Comments)    hallucinations   . Tape Rash    76M Transpore adhesive tape. Medical tape  . Vicodin [Hydrocodone-Acetaminophen] Other (See Comments)    hallucinations     CURRENT MEDICATIONS:   Current Outpatient Medications  Medication Sig Dispense Refill  . acetaminophen (TYLENOL) 500 MG tablet Take 500 mg by mouth 2 (two) times daily.    Marland Kitchen albuterol (PROVENTIL) (2.5 MG/76ML) 0.083% nebulizer solution Take 3 mLs (2.5 mg total) by nebulization every 6 (six) hours as needed for wheezing or shortness of breath. 75 mL 12  . amLODipine (NORVASC) 10 MG tablet Take 10 mg at bedtime by mouth.  0  . aspirin 81 MG chewable tablet Chew 81 mg by mouth daily with lunch.     Marland Kitchen atropine 1 % ophthalmic solution Place 1 drop into the right eye 2 (two) times daily.  0  . B Complex-C-Folic Acid (RENA-VITE RX) 1 MG TABS Take 1 tablet by mouth daily with lunch.   1  .  carvedilol (COREG) 25 MG tablet Take 25 mg by mouth 2 (two) times daily.  2  . darbepoetin (ARANESP) 200 MCG/0.4ML SOLN injection Inject 0.4 mLs (200 mcg total) into the vein every Wednesday with hemodialysis. 1.68 mL   . doxercalciferol (HECTOROL) 4 MCG/2ML injection Inject 0.5 mLs (1 mcg total) into the vein every Monday, Wednesday, and Friday with hemodialysis. 2 mL   . gabapentin (NEURONTIN) 100 MG capsule Take 100 mg by mouth 2 (two) times daily.    . insulin aspart (NOVOLOG FLEXPEN) 100 UNIT/ML FlexPen Inject 8-10 Units into the skin 3 (three) times daily with meals. per Sliding scale    . lanthanum (FOSRENOL) 1000 MG chewable tablet Chew 2,000 mg by mouth 3 (three) times daily with meals.     Marland Kitchen lisinopril (PRINIVIL,ZESTRIL) 40 MG tablet Take 40 mg by mouth at bedtime.     Marland Kitchen omeprazole (PRILOSEC) 40 MG capsule Take 1 capsule (40 mg total) by mouth daily.    [name] test strip 3  (three) times daily. for testing  9  . prednisoLONE acetate (PRED FORTE) 1 % ophthalmic suspension Place 1 drop into the right eye 2 (two) times daily.   0   Current Facility-Administered Medications  Medication Dose Route Frequency Provider Last Rate Last Dose  . 0.9 %  sodium chloride infusion  500 mL Intravenous Once [name], MD        REVIEW OF SYSTEMS:   [X]  denotes positive finding, [ ]  denotes negative finding Cardiac  Comments:  Chest pain or chest pressure:    Shortness of breath upon exertion:    Short of breath when lying flat:    Irregular heart rhythm:        Vascular    Pain in calf, thigh, or hip brought on by ambulation:    Pain in feet at night that wakes you up from your sleep:     Blood clot in your veins:    Leg swelling:         Pulmonary    Oxygen at home:    Productive cough:     Wheezing:         Neurologic    Sudden weakness in arms or legs:     Sudden numbness in arms or legs:     Sudden onset of difficulty speaking or slurred speech:    Temporary loss of vision in one eye:     Problems with dizziness:         Gastrointestinal    Blood in stool:     Vomited blood:         Genitourinary    Burning when urinating:     Blood in urine:        Psychiatric    Major depression:         Hematologic    Bleeding problems:    Problems with blood clotting too easily:        Skin    Rashes or ulcers:        Constitutional    Fever or chills:      PHYSICAL EXAM:   Vitals:   09/06/18 1446  BP: 104/63  Pulse: 88  Resp: 16  Temp: (!) 97.3 F (36.3 C)  TempSrc: Oral  SpO2: 95%  Weight: 199 lb 8.3 oz (90.5 kg)  Height: 5' 5.5" (1.664 m)    GENERAL: The patient is a well-nourished female, in no acute distress. The vital signs are documented above.  CARDIAC: There is a regular rate and rhythm.  VASCULAR: Aneurysmal fistula.  No active ulcerations. PULMONARY: Non-labored respirations  MUSCULOSKELETAL: There are no major  deformities or cyanosis. NEUROLOGIC: No focal weakness or paresthesias are detected. SKIN: There are no ulcers or rashes noted. PSYCHIATRIC: The patient has a normal affect.  STUDIES:   None  MEDICAL ISSUES:   I have recommended that we proceed with a right arm fistulogram to evaluate her for central stenosis.  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  She does have some hypopigmented skin which has thinned out.  There were no active ulcers.  I will see what her fistulogram shows with regards to stenosis before making definitive recommendations.  This is been scheduled for Tuesday March 3    Annamarie Major, MD [facility] Specialists of [facility] [phone] Pager [phone]

## 2018-09-07 ENCOUNTER — Ambulatory Visit (INDEPENDENT_AMBULATORY_CARE_PROVIDER_SITE_OTHER): Payer: Medicare Other | Admitting: Podiatry

## 2018-09-07 ENCOUNTER — Encounter: Payer: Self-pay | Admitting: Podiatry

## 2018-09-07 VITALS — BP 147/68 | HR 78

## 2018-09-07 DIAGNOSIS — M2042 Other hammer toe(s) (acquired), left foot: Secondary | ICD-10-CM

## 2018-09-07 DIAGNOSIS — B351 Tinea unguium: Secondary | ICD-10-CM

## 2018-09-07 DIAGNOSIS — E1142 Type 2 diabetes mellitus with diabetic polyneuropathy: Secondary | ICD-10-CM

## 2018-09-07 DIAGNOSIS — M2010 Hallux valgus (acquired), unspecified foot: Secondary | ICD-10-CM

## 2018-09-07 DIAGNOSIS — M2041 Other hammer toe(s) (acquired), right foot: Secondary | ICD-10-CM | POA: Diagnosis not present

## 2018-09-07 NOTE — Progress Notes (Signed)
This patient presents to the office with chief complaint of long thick nails and diabetic feet.  This patient  says there  is  no pain and discomfort in her  feet.  This patient says there are long thick painful nails.  These nails are painful walking and wearing shoes.  Patient has no history of infection or drainage from both feet.  Patient is unable to  self treat his own nails . This patient presents  to the office today for treatment of the  long nails and a foot evaluation due to history of  Diabetes. She also requests diabetic shoes.  General Appearance  Alert, conversant and in no acute stress.  Vascular  Dorsalis pedis and posterior tibial  pulses are palpable  bilaterally.  Capillary return is within normal limits  bilaterally. Temperature is within normal limits  bilaterally.  Neurologic  Senn-Weinstein monofilament wire test diminished  bilaterally. Muscle power within normal limits bilaterally.  Nails Thick disfigured discolored nails with subungual debris  from hallux to fifth toes bilaterally. No evidence of bacterial infection or drainage bilaterally.  Orthopedic  No limitations of motion of motion feet .  No crepitus or effusions noted.  No bony pathology or digital deformities noted. Severe HAV  B/L.  Skin  normotropic skin with no porokeratosis noted bilaterally.  No signs of infections or ulcers noted.     Onychomycosis  Diabetes with neuropathy.    IE  Debride nails x 10.  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  Patient was brought to [facility] to be measured for her diabetic shoes. RTC 3 months for preventative foot care services.   Gardiner Barefoot DPM

## 2018-09-08 DIAGNOSIS — N2581 Secondary hyperparathyroidism of renal origin: Secondary | ICD-10-CM | POA: Diagnosis not present

## 2018-09-08 DIAGNOSIS — N186 End stage renal disease: Secondary | ICD-10-CM | POA: Diagnosis not present

## 2018-09-08 DIAGNOSIS — D631 Anemia in chronic kidney disease: Secondary | ICD-10-CM | POA: Diagnosis not present

## 2018-09-08 DIAGNOSIS — Z992 Dependence on renal dialysis: Secondary | ICD-10-CM | POA: Diagnosis not present

## 2018-09-08 DIAGNOSIS — E1129 Type 2 diabetes mellitus with other diabetic kidney complication: Secondary | ICD-10-CM | POA: Diagnosis not present

## 2018-09-10 DIAGNOSIS — N2581 Secondary hyperparathyroidism of renal origin: Secondary | ICD-10-CM | POA: Diagnosis not present

## 2018-09-10 DIAGNOSIS — D631 Anemia in chronic kidney disease: Secondary | ICD-10-CM | POA: Diagnosis not present

## 2018-09-10 DIAGNOSIS — Z992 Dependence on renal dialysis: Secondary | ICD-10-CM | POA: Diagnosis not present

## 2018-09-10 DIAGNOSIS — E1129 Type 2 diabetes mellitus with other diabetic kidney complication: Secondary | ICD-10-CM | POA: Diagnosis not present

## 2018-09-10 DIAGNOSIS — N186 End stage renal disease: Secondary | ICD-10-CM | POA: Diagnosis not present

## 2018-09-13 DIAGNOSIS — D631 Anemia in chronic kidney disease: Secondary | ICD-10-CM | POA: Diagnosis not present

## 2018-09-13 DIAGNOSIS — Z992 Dependence on renal dialysis: Secondary | ICD-10-CM | POA: Diagnosis not present

## 2018-09-13 DIAGNOSIS — N186 End stage renal disease: Secondary | ICD-10-CM | POA: Diagnosis not present

## 2018-09-13 DIAGNOSIS — N2581 Secondary hyperparathyroidism of renal origin: Secondary | ICD-10-CM | POA: Diagnosis not present

## 2018-09-13 DIAGNOSIS — E1129 Type 2 diabetes mellitus with other diabetic kidney complication: Secondary | ICD-10-CM | POA: Diagnosis not present

## 2018-09-15 DIAGNOSIS — Z992 Dependence on renal dialysis: Secondary | ICD-10-CM | POA: Diagnosis not present

## 2018-09-15 DIAGNOSIS — D631 Anemia in chronic kidney disease: Secondary | ICD-10-CM | POA: Diagnosis not present

## 2018-09-15 DIAGNOSIS — E1129 Type 2 diabetes mellitus with other diabetic kidney complication: Secondary | ICD-10-CM | POA: Diagnosis not present

## 2018-09-15 DIAGNOSIS — N2581 Secondary hyperparathyroidism of renal origin: Secondary | ICD-10-CM | POA: Diagnosis not present

## 2018-09-15 DIAGNOSIS — N186 End stage renal disease: Secondary | ICD-10-CM | POA: Diagnosis not present

## 2018-09-17 DIAGNOSIS — N2581 Secondary hyperparathyroidism of renal origin: Secondary | ICD-10-CM | POA: Diagnosis not present

## 2018-09-17 DIAGNOSIS — E1129 Type 2 diabetes mellitus with other diabetic kidney complication: Secondary | ICD-10-CM | POA: Diagnosis not present

## 2018-09-17 DIAGNOSIS — N186 End stage renal disease: Secondary | ICD-10-CM | POA: Diagnosis not present

## 2018-09-17 DIAGNOSIS — D631 Anemia in chronic kidney disease: Secondary | ICD-10-CM | POA: Diagnosis not present

## 2018-09-17 DIAGNOSIS — Z992 Dependence on renal dialysis: Secondary | ICD-10-CM | POA: Diagnosis not present

## 2018-09-19 DIAGNOSIS — E1129 Type 2 diabetes mellitus with other diabetic kidney complication: Secondary | ICD-10-CM | POA: Diagnosis not present

## 2018-09-19 DIAGNOSIS — N186 End stage renal disease: Secondary | ICD-10-CM | POA: Diagnosis not present

## 2018-09-19 DIAGNOSIS — Z992 Dependence on renal dialysis: Secondary | ICD-10-CM | POA: Diagnosis not present

## 2018-09-20 DIAGNOSIS — N186 End stage renal disease: Secondary | ICD-10-CM | POA: Diagnosis not present

## 2018-09-20 DIAGNOSIS — D509 Iron deficiency anemia, unspecified: Secondary | ICD-10-CM | POA: Diagnosis not present

## 2018-09-20 DIAGNOSIS — Z992 Dependence on renal dialysis: Secondary | ICD-10-CM | POA: Diagnosis not present

## 2018-09-20 DIAGNOSIS — D631 Anemia in chronic kidney disease: Secondary | ICD-10-CM | POA: Diagnosis not present

## 2018-09-20 DIAGNOSIS — E1129 Type 2 diabetes mellitus with other diabetic kidney complication: Secondary | ICD-10-CM | POA: Diagnosis not present

## 2018-09-20 DIAGNOSIS — N2581 Secondary hyperparathyroidism of renal origin: Secondary | ICD-10-CM | POA: Diagnosis not present

## 2018-09-21 ENCOUNTER — Encounter (HOSPITAL_COMMUNITY): Payer: Self-pay | Admitting: Surgery

## 2018-09-21 ENCOUNTER — Encounter (HOSPITAL_COMMUNITY)
Admission: RE | Disposition: A | Discharge status: Home / Self Care | Payer: Self-pay | Source: Home / Self Care | Attending: Surgery

## 2018-09-21 ENCOUNTER — Ambulatory Visit (HOSPITAL_COMMUNITY)
Admission: RE | Admit: 2018-09-21 | Discharge: 2018-09-21 | Disposition: A | Discharge status: Home / Self Care | Payer: Medicare Other | Attending: Surgery | Admitting: Surgery

## 2018-09-21 ENCOUNTER — Other Ambulatory Visit: Payer: Self-pay

## 2018-09-21 DIAGNOSIS — J45909 Unspecified asthma, uncomplicated: Secondary | ICD-10-CM | POA: Diagnosis not present

## 2018-09-21 DIAGNOSIS — Z8249 Family history of ischemic heart disease and other diseases of the circulatory system: Secondary | ICD-10-CM | POA: Diagnosis not present

## 2018-09-21 DIAGNOSIS — I132 Hypertensive heart and chronic kidney disease with heart failure and with stage 5 chronic kidney disease, or end stage renal disease: Secondary | ICD-10-CM | POA: Diagnosis not present

## 2018-09-21 DIAGNOSIS — K219 Gastro-esophageal reflux disease without esophagitis: Secondary | ICD-10-CM | POA: Insufficient documentation

## 2018-09-21 DIAGNOSIS — I509 Heart failure, unspecified: Secondary | ICD-10-CM | POA: Insufficient documentation

## 2018-09-21 DIAGNOSIS — Z7982 Long term (current) use of aspirin: Secondary | ICD-10-CM | POA: Diagnosis not present

## 2018-09-21 DIAGNOSIS — H547 Unspecified visual loss: Secondary | ICD-10-CM | POA: Insufficient documentation

## 2018-09-21 DIAGNOSIS — Z91040 Latex allergy status: Secondary | ICD-10-CM | POA: Diagnosis not present

## 2018-09-21 DIAGNOSIS — Z833 Family history of diabetes mellitus: Secondary | ICD-10-CM | POA: Insufficient documentation

## 2018-09-21 DIAGNOSIS — E1122 Type 2 diabetes mellitus with diabetic chronic kidney disease: Secondary | ICD-10-CM | POA: Diagnosis not present

## 2018-09-21 DIAGNOSIS — Y832 Surgical operation with anastomosis, bypass or graft as the cause of abnormal reaction of the patient, or of later complication, without mention of misadventure at the time of the procedure: Secondary | ICD-10-CM | POA: Insufficient documentation

## 2018-09-21 DIAGNOSIS — Z79899 Other long term (current) drug therapy: Secondary | ICD-10-CM | POA: Insufficient documentation

## 2018-09-21 DIAGNOSIS — N186 End stage renal disease: Secondary | ICD-10-CM | POA: Diagnosis not present

## 2018-09-21 DIAGNOSIS — Z885 Allergy status to narcotic agent status: Secondary | ICD-10-CM | POA: Diagnosis not present

## 2018-09-21 DIAGNOSIS — T82898A Other specified complication of vascular prosthetic devices, implants and grafts, initial encounter: Secondary | ICD-10-CM | POA: Diagnosis not present

## 2018-09-21 DIAGNOSIS — T82858A Stenosis of vascular prosthetic devices, implants and grafts, initial encounter: Secondary | ICD-10-CM | POA: Insufficient documentation

## 2018-09-21 DIAGNOSIS — E1140 Type 2 diabetes mellitus with diabetic neuropathy, unspecified: Secondary | ICD-10-CM | POA: Insufficient documentation

## 2018-09-21 DIAGNOSIS — Z992 Dependence on renal dialysis: Secondary | ICD-10-CM | POA: Insufficient documentation

## 2018-09-21 DIAGNOSIS — Z794 Long term (current) use of insulin: Secondary | ICD-10-CM | POA: Diagnosis not present

## 2018-09-21 DIAGNOSIS — Z888 Allergy status to other drugs, medicaments and biological substances status: Secondary | ICD-10-CM | POA: Insufficient documentation

## 2018-09-21 HISTORY — PX: PERIPHERAL VASCULAR BALLOON ANGIOPLASTY: CATH118281

## 2018-09-21 HISTORY — PX: A/V FISTULAGRAM: CATH118298

## 2018-09-21 LAB — POCT I-STAT 4, (NA,K, GLUC, HGB,HCT)
Glucose, Bld: 163 mg/dL — ABNORMAL HIGH (ref 70–99)
HCT: 34 % — ABNORMAL LOW (ref 36.0–46.0)
Hemoglobin: 11.6 g/dL — ABNORMAL LOW (ref 12.0–15.0)
Potassium: 4 mmol/L (ref 3.5–5.1)
Sodium: 139 mmol/L (ref 135–145)

## 2018-09-21 LAB — POCT I-STAT CREATININE: CREATININE: 8.8 mg/dL — AB (ref 0.44–1.00)

## 2018-09-21 SURGERY — A/V FISTULAGRAM
Anesthesia: LOCAL | Laterality: Right

## 2018-09-21 MED ORDER — IODIXANOL 320 MG/ML IV SOLN
INTRAVENOUS | Status: DC | PRN
  Administered 2018-09-21: 60 mL via INTRAVENOUS

## 2018-09-21 MED ORDER — LIDOCAINE HCL (PF) 1 % IJ SOLN
INTRAMUSCULAR | Status: DC | PRN
  Administered 2018-09-21: 2 mL via INTRADERMAL

## 2018-09-21 MED ORDER — HEPARIN (PORCINE) IN NACL 1000-0.9 UT/500ML-% IV SOLN
INTRAVENOUS | Status: AC
  Filled 2018-09-21: qty 500

## 2018-09-21 MED ORDER — SODIUM CHLORIDE 0.9% FLUSH: 3.0000 mL | INTRAVENOUS | Status: DC | PRN

## 2018-09-21 MED ORDER — SODIUM CHLORIDE 0.9% FLUSH: 3.0000 mL | Freq: Two times a day (BID) | INTRAVENOUS | Status: DC

## 2018-09-21 MED ORDER — MIDAZOLAM HCL 2 MG/2ML IJ SOLN
INTRAMUSCULAR | Status: AC
  Filled 2018-09-21: qty 2

## 2018-09-21 MED ORDER — HEPARIN (PORCINE) IN NACL 1000-0.9 UT/500ML-% IV SOLN
INTRAVENOUS | Status: DC | PRN
  Administered 2018-09-21: 500 mL

## 2018-09-21 MED ORDER — LIDOCAINE HCL (PF) 1 % IJ SOLN
INTRAMUSCULAR | Status: AC
  Filled 2018-09-21: qty 30

## 2018-09-21 MED ORDER — FENTANYL CITRATE (PF) 100 MCG/2ML IJ SOLN
INTRAMUSCULAR | Status: AC
  Filled 2018-09-21: qty 2

## 2018-09-21 MED ORDER — MIDAZOLAM HCL 2 MG/2ML IJ SOLN
INTRAMUSCULAR | Status: DC | PRN
  Administered 2018-09-21: 1 mg via INTRAVENOUS

## 2018-09-21 MED ORDER — FENTANYL CITRATE (PF) 100 MCG/2ML IJ SOLN
INTRAMUSCULAR | Status: DC | PRN
  Administered 2018-09-21: 25 ug via INTRAVENOUS

## 2018-09-21 MED ORDER — SODIUM CHLORIDE 0.9 % IV SOLN: 250.0000 mL | INTRAVENOUS | Status: DC | PRN

## 2018-09-21 SURGICAL SUPPLY — 16 items
BAG SNAP BAND KOVER 36X36 (MISCELLANEOUS) ×3 IMPLANT
BALLN MUSTANG 7X60X75 (BALLOONS) ×3
BALLN MUSTANG 9X60X75 (BALLOONS) ×3
BALLOON MUSTANG 7X60X75 (BALLOONS) IMPLANT
BALLOON MUSTANG 9X60X75 (BALLOONS) IMPLANT
COVER DOME SNAP 22 D (MISCELLANEOUS) ×3 IMPLANT
KIT ENCORE 26 ADVANTAGE (KITS) ×1 IMPLANT
KIT MICROPUNCTURE NIT STIFF (SHEATH) ×1 IMPLANT
PROTECTION STATION PRESSURIZED (MISCELLANEOUS) ×3
SHEATH PINNACLE R/O II 6F 4CM (SHEATH) ×1 IMPLANT
SHEATH PROBE COVER 6X72 (BAG) ×3 IMPLANT
STATION PROTECTION PRESSURIZED (MISCELLANEOUS) ×2 IMPLANT
STOPCOCK MORSE 400PSI 3WAY (MISCELLANEOUS) ×3 IMPLANT
TRAY PV CATH (CUSTOM PROCEDURE TRAY) ×3 IMPLANT
TUBING CIL FLEX 10 FLL-RA (TUBING) ×3 IMPLANT
WIRE BENTSON .035X145CM (WIRE) ×1 IMPLANT

## 2018-09-21 NOTE — Interval H&P Note (Signed)
History and Physical Interval Note:  09/21/2018 7:33 AM  [name]  has presented today for surgery, with the diagnosis of poor flow  The various methods of treatment have been discussed with the patient and family. After consideration of risks, benefits and other options for treatment, the patient has consented to  Procedure(s): A/V FISTULAGRAM - Right Upper (N/A) as a surgical intervention .  The patient's history has been reviewed, patient examined, no change in status, stable for surgery.  I have reviewed the patient's chart and labs.  Questions were answered to the patient's satisfaction.     Annamarie Major

## 2018-09-21 NOTE — Op Note (Signed)
    Patient name: SARIA HARAN MRN: [id number] DOB: Sep 10, 1950 Sex: female  09/21/2018 Pre-operative Diagnosis: ESRD Post-operative diagnosis:  Same Surgeon:  Annamarie Major Procedure Performed:  1.  Ultrasound-guided access, right cephalic vein  2.  Fistulogram  3.  Venoplasty, right cephalic vein (peripheral)  4.  Conscious sedation (30 minutes)   Indications: Patient has aneurysmal changes to her fistula as well as prolonged bleeding.  She is here today for further evaluation  Procedure:  The patient was identified in the holding area and taken to room 8.  The patient was then placed supine on the table and prepped and draped in the usual sterile fashion.  A time out was called.  Conscious sedation was administered with use of IV fentanyl Versed under continuous physician and nurse monitoring.  Heart rate, blood pressure, and oxygen saturation were continuously monitored.  Ultrasound was used to evaluate the fistula.  The vein was patent and compressible.  A digital ultrasound image was acquired.  The fistula was then accessed under ultrasound guidance using a micropuncture needle.  An 018 wire was then asvanced without resistance and a micropuncture sheath was placed.  Contrast injections were then performed through the sheath.  Findings: The central venous system is widely patent.  The cephalic vein has diffuse narrowing over a distance of approximately 4 cm as it joins into the central venous system.  Narrowing is approximately 60%.  2 areas of aneurysmal degeneration are noted in the fistula in the mid arm.  The arterial venous anastomosis is widely patent   Intervention: After the above images were acquired the decision was made to proceed with intervention.  A 6 French sheath was inserted.  A Bentson wire was advanced across the lesion.  I first performed balloon angioplasty of the proximal cephalic vein using a 7 x 60 balloon.  This was taken to 12 atm for 1 minute.  Completion imaging  revealed slightly improved result.  I elected to upsize to a 9 x 60 balloon and repeated balloon angioplasty.  There was an obvious waist with the balloon inflated to burst pressure.  I elected not to be too aggressive.  Angioplasty was performed at this level and atmospheres for 24 for 1 minute.  Completion imaging revealed residual lesion of about 15-20%.  I did not want to upsize the balloon for fear of rupture of the fistula.  Flow through this area was definitely improved.  At this point catheters and wires were removed and suture was used to close the cannulation site.  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  Venoplasty was performed up to a 9 mm balloon with residual stenosis of approximately 15-20%.  This remains amenable to percutaneous intervention.  #2  Aneurysmal degeneration x2 within the fistula  #3  Central venous system is widely patent  #4  Arterial venous anastomosis is widely patent    V. Annamarie Major, M.D. [facility] [place] Office: [phone] Pager:  [phone]

## 2018-09-21 NOTE — Discharge Instructions (Signed)

## 2018-09-22 DIAGNOSIS — Z992 Dependence on renal dialysis: Secondary | ICD-10-CM | POA: Diagnosis not present

## 2018-09-22 DIAGNOSIS — E1129 Type 2 diabetes mellitus with other diabetic kidney complication: Secondary | ICD-10-CM | POA: Diagnosis not present

## 2018-09-22 DIAGNOSIS — D509 Iron deficiency anemia, unspecified: Secondary | ICD-10-CM | POA: Diagnosis not present

## 2018-09-22 DIAGNOSIS — N2581 Secondary hyperparathyroidism of renal origin: Secondary | ICD-10-CM | POA: Diagnosis not present

## 2018-09-22 DIAGNOSIS — D631 Anemia in chronic kidney disease: Secondary | ICD-10-CM | POA: Diagnosis not present

## 2018-09-22 DIAGNOSIS — N186 End stage renal disease: Secondary | ICD-10-CM | POA: Diagnosis not present

## 2018-09-24 DIAGNOSIS — E1129 Type 2 diabetes mellitus with other diabetic kidney complication: Secondary | ICD-10-CM | POA: Diagnosis not present

## 2018-09-24 DIAGNOSIS — N2581 Secondary hyperparathyroidism of renal origin: Secondary | ICD-10-CM | POA: Diagnosis not present

## 2018-09-24 DIAGNOSIS — D509 Iron deficiency anemia, unspecified: Secondary | ICD-10-CM | POA: Diagnosis not present

## 2018-09-24 DIAGNOSIS — N186 End stage renal disease: Secondary | ICD-10-CM | POA: Diagnosis not present

## 2018-09-24 DIAGNOSIS — Z992 Dependence on renal dialysis: Secondary | ICD-10-CM | POA: Diagnosis not present

## 2018-09-24 DIAGNOSIS — D631 Anemia in chronic kidney disease: Secondary | ICD-10-CM | POA: Diagnosis not present

## 2018-09-27 DIAGNOSIS — Z992 Dependence on renal dialysis: Secondary | ICD-10-CM | POA: Diagnosis not present

## 2018-09-27 DIAGNOSIS — D509 Iron deficiency anemia, unspecified: Secondary | ICD-10-CM | POA: Diagnosis not present

## 2018-09-27 DIAGNOSIS — N186 End stage renal disease: Secondary | ICD-10-CM | POA: Diagnosis not present

## 2018-09-27 DIAGNOSIS — E1129 Type 2 diabetes mellitus with other diabetic kidney complication: Secondary | ICD-10-CM | POA: Diagnosis not present

## 2018-09-27 DIAGNOSIS — N2581 Secondary hyperparathyroidism of renal origin: Secondary | ICD-10-CM | POA: Diagnosis not present

## 2018-09-27 DIAGNOSIS — D631 Anemia in chronic kidney disease: Secondary | ICD-10-CM | POA: Diagnosis not present

## 2018-09-29 DIAGNOSIS — D509 Iron deficiency anemia, unspecified: Secondary | ICD-10-CM | POA: Diagnosis not present

## 2018-09-29 DIAGNOSIS — N186 End stage renal disease: Secondary | ICD-10-CM | POA: Diagnosis not present

## 2018-09-29 DIAGNOSIS — Z992 Dependence on renal dialysis: Secondary | ICD-10-CM | POA: Diagnosis not present

## 2018-09-29 DIAGNOSIS — D631 Anemia in chronic kidney disease: Secondary | ICD-10-CM | POA: Diagnosis not present

## 2018-09-29 DIAGNOSIS — N2581 Secondary hyperparathyroidism of renal origin: Secondary | ICD-10-CM | POA: Diagnosis not present

## 2018-09-29 DIAGNOSIS — E1129 Type 2 diabetes mellitus with other diabetic kidney complication: Secondary | ICD-10-CM | POA: Diagnosis not present

## 2018-10-01 DIAGNOSIS — D631 Anemia in chronic kidney disease: Secondary | ICD-10-CM | POA: Diagnosis not present

## 2018-10-01 DIAGNOSIS — N2581 Secondary hyperparathyroidism of renal origin: Secondary | ICD-10-CM | POA: Diagnosis not present

## 2018-10-01 DIAGNOSIS — Z992 Dependence on renal dialysis: Secondary | ICD-10-CM | POA: Diagnosis not present

## 2018-10-01 DIAGNOSIS — D509 Iron deficiency anemia, unspecified: Secondary | ICD-10-CM | POA: Diagnosis not present

## 2018-10-01 DIAGNOSIS — N186 End stage renal disease: Secondary | ICD-10-CM | POA: Diagnosis not present

## 2018-10-01 DIAGNOSIS — E1129 Type 2 diabetes mellitus with other diabetic kidney complication: Secondary | ICD-10-CM | POA: Diagnosis not present

## 2018-10-04 DIAGNOSIS — D509 Iron deficiency anemia, unspecified: Secondary | ICD-10-CM | POA: Diagnosis not present

## 2018-10-04 DIAGNOSIS — D631 Anemia in chronic kidney disease: Secondary | ICD-10-CM | POA: Diagnosis not present

## 2018-10-04 DIAGNOSIS — E1129 Type 2 diabetes mellitus with other diabetic kidney complication: Secondary | ICD-10-CM | POA: Diagnosis not present

## 2018-10-04 DIAGNOSIS — N2581 Secondary hyperparathyroidism of renal origin: Secondary | ICD-10-CM | POA: Diagnosis not present

## 2018-10-04 DIAGNOSIS — Z992 Dependence on renal dialysis: Secondary | ICD-10-CM | POA: Diagnosis not present

## 2018-10-04 DIAGNOSIS — N186 End stage renal disease: Secondary | ICD-10-CM | POA: Diagnosis not present

## 2018-10-06 DIAGNOSIS — N2581 Secondary hyperparathyroidism of renal origin: Secondary | ICD-10-CM | POA: Diagnosis not present

## 2018-10-06 DIAGNOSIS — E1129 Type 2 diabetes mellitus with other diabetic kidney complication: Secondary | ICD-10-CM | POA: Diagnosis not present

## 2018-10-06 DIAGNOSIS — D631 Anemia in chronic kidney disease: Secondary | ICD-10-CM | POA: Diagnosis not present

## 2018-10-06 DIAGNOSIS — Z992 Dependence on renal dialysis: Secondary | ICD-10-CM | POA: Diagnosis not present

## 2018-10-06 DIAGNOSIS — N186 End stage renal disease: Secondary | ICD-10-CM | POA: Diagnosis not present

## 2018-10-06 DIAGNOSIS — D509 Iron deficiency anemia, unspecified: Secondary | ICD-10-CM | POA: Diagnosis not present

## 2018-10-08 DIAGNOSIS — E1129 Type 2 diabetes mellitus with other diabetic kidney complication: Secondary | ICD-10-CM | POA: Diagnosis not present

## 2018-10-08 DIAGNOSIS — D509 Iron deficiency anemia, unspecified: Secondary | ICD-10-CM | POA: Diagnosis not present

## 2018-10-08 DIAGNOSIS — N2581 Secondary hyperparathyroidism of renal origin: Secondary | ICD-10-CM | POA: Diagnosis not present

## 2018-10-08 DIAGNOSIS — Z992 Dependence on renal dialysis: Secondary | ICD-10-CM | POA: Diagnosis not present

## 2018-10-08 DIAGNOSIS — D631 Anemia in chronic kidney disease: Secondary | ICD-10-CM | POA: Diagnosis not present

## 2018-10-08 DIAGNOSIS — N186 End stage renal disease: Secondary | ICD-10-CM | POA: Diagnosis not present

## 2018-10-11 DIAGNOSIS — E1129 Type 2 diabetes mellitus with other diabetic kidney complication: Secondary | ICD-10-CM | POA: Diagnosis not present

## 2018-10-11 DIAGNOSIS — D631 Anemia in chronic kidney disease: Secondary | ICD-10-CM | POA: Diagnosis not present

## 2018-10-11 DIAGNOSIS — N186 End stage renal disease: Secondary | ICD-10-CM | POA: Diagnosis not present

## 2018-10-11 DIAGNOSIS — Z992 Dependence on renal dialysis: Secondary | ICD-10-CM | POA: Diagnosis not present

## 2018-10-11 DIAGNOSIS — N2581 Secondary hyperparathyroidism of renal origin: Secondary | ICD-10-CM | POA: Diagnosis not present

## 2018-10-11 DIAGNOSIS — D509 Iron deficiency anemia, unspecified: Secondary | ICD-10-CM | POA: Diagnosis not present

## 2018-10-13 DIAGNOSIS — Z992 Dependence on renal dialysis: Secondary | ICD-10-CM | POA: Diagnosis not present

## 2018-10-13 DIAGNOSIS — E1129 Type 2 diabetes mellitus with other diabetic kidney complication: Secondary | ICD-10-CM | POA: Diagnosis not present

## 2018-10-13 DIAGNOSIS — N2581 Secondary hyperparathyroidism of renal origin: Secondary | ICD-10-CM | POA: Diagnosis not present

## 2018-10-13 DIAGNOSIS — D509 Iron deficiency anemia, unspecified: Secondary | ICD-10-CM | POA: Diagnosis not present

## 2018-10-13 DIAGNOSIS — N186 End stage renal disease: Secondary | ICD-10-CM | POA: Diagnosis not present

## 2018-10-13 DIAGNOSIS — D631 Anemia in chronic kidney disease: Secondary | ICD-10-CM | POA: Diagnosis not present

## 2018-10-14 DIAGNOSIS — F329 Major depressive disorder, single episode, unspecified: Secondary | ICD-10-CM | POA: Diagnosis not present

## 2018-10-14 DIAGNOSIS — N186 End stage renal disease: Secondary | ICD-10-CM | POA: Diagnosis not present

## 2018-10-14 DIAGNOSIS — G609 Hereditary and idiopathic neuropathy, unspecified: Secondary | ICD-10-CM | POA: Diagnosis not present

## 2018-10-14 DIAGNOSIS — E1129 Type 2 diabetes mellitus with other diabetic kidney complication: Secondary | ICD-10-CM | POA: Diagnosis not present

## 2018-10-14 DIAGNOSIS — Z1389 Encounter for screening for other disorder: Secondary | ICD-10-CM | POA: Diagnosis not present

## 2018-10-14 DIAGNOSIS — H540X55 Blindness right eye category 5, blindness left eye category 5: Secondary | ICD-10-CM | POA: Diagnosis not present

## 2018-10-14 DIAGNOSIS — M25559 Pain in unspecified hip: Secondary | ICD-10-CM | POA: Diagnosis not present

## 2018-10-14 DIAGNOSIS — Z1331 Encounter for screening for depression: Secondary | ICD-10-CM | POA: Diagnosis not present

## 2018-10-14 DIAGNOSIS — K922 Gastrointestinal hemorrhage, unspecified: Secondary | ICD-10-CM | POA: Diagnosis not present

## 2018-10-15 DIAGNOSIS — D509 Iron deficiency anemia, unspecified: Secondary | ICD-10-CM | POA: Diagnosis not present

## 2018-10-15 DIAGNOSIS — N2581 Secondary hyperparathyroidism of renal origin: Secondary | ICD-10-CM | POA: Diagnosis not present

## 2018-10-15 DIAGNOSIS — N186 End stage renal disease: Secondary | ICD-10-CM | POA: Diagnosis not present

## 2018-10-15 DIAGNOSIS — D631 Anemia in chronic kidney disease: Secondary | ICD-10-CM | POA: Diagnosis not present

## 2018-10-15 DIAGNOSIS — Z992 Dependence on renal dialysis: Secondary | ICD-10-CM | POA: Diagnosis not present

## 2018-10-15 DIAGNOSIS — E1129 Type 2 diabetes mellitus with other diabetic kidney complication: Secondary | ICD-10-CM | POA: Diagnosis not present

## 2018-10-18 DIAGNOSIS — Z992 Dependence on renal dialysis: Secondary | ICD-10-CM | POA: Diagnosis not present

## 2018-10-18 DIAGNOSIS — D631 Anemia in chronic kidney disease: Secondary | ICD-10-CM | POA: Diagnosis not present

## 2018-10-18 DIAGNOSIS — N186 End stage renal disease: Secondary | ICD-10-CM | POA: Diagnosis not present

## 2018-10-18 DIAGNOSIS — E1129 Type 2 diabetes mellitus with other diabetic kidney complication: Secondary | ICD-10-CM | POA: Diagnosis not present

## 2018-10-18 DIAGNOSIS — D509 Iron deficiency anemia, unspecified: Secondary | ICD-10-CM | POA: Diagnosis not present

## 2018-10-18 DIAGNOSIS — N2581 Secondary hyperparathyroidism of renal origin: Secondary | ICD-10-CM | POA: Diagnosis not present

## 2018-10-20 DIAGNOSIS — M869 Osteomyelitis, unspecified: Secondary | ICD-10-CM | POA: Diagnosis not present

## 2018-10-20 DIAGNOSIS — E1129 Type 2 diabetes mellitus with other diabetic kidney complication: Secondary | ICD-10-CM | POA: Diagnosis not present

## 2018-10-20 DIAGNOSIS — D631 Anemia in chronic kidney disease: Secondary | ICD-10-CM | POA: Diagnosis not present

## 2018-10-20 DIAGNOSIS — D509 Iron deficiency anemia, unspecified: Secondary | ICD-10-CM | POA: Diagnosis not present

## 2018-10-20 DIAGNOSIS — Z992 Dependence on renal dialysis: Secondary | ICD-10-CM | POA: Diagnosis not present

## 2018-10-20 DIAGNOSIS — N2581 Secondary hyperparathyroidism of renal origin: Secondary | ICD-10-CM | POA: Diagnosis not present

## 2018-10-20 DIAGNOSIS — N186 End stage renal disease: Secondary | ICD-10-CM | POA: Diagnosis not present

## 2018-10-22 DIAGNOSIS — D509 Iron deficiency anemia, unspecified: Secondary | ICD-10-CM | POA: Diagnosis not present

## 2018-10-22 DIAGNOSIS — N2581 Secondary hyperparathyroidism of renal origin: Secondary | ICD-10-CM | POA: Diagnosis not present

## 2018-10-22 DIAGNOSIS — Z992 Dependence on renal dialysis: Secondary | ICD-10-CM | POA: Diagnosis not present

## 2018-10-22 DIAGNOSIS — N186 End stage renal disease: Secondary | ICD-10-CM | POA: Diagnosis not present

## 2018-10-22 DIAGNOSIS — D631 Anemia in chronic kidney disease: Secondary | ICD-10-CM | POA: Diagnosis not present

## 2018-10-22 DIAGNOSIS — M869 Osteomyelitis, unspecified: Secondary | ICD-10-CM | POA: Diagnosis not present

## 2018-10-25 DIAGNOSIS — D509 Iron deficiency anemia, unspecified: Secondary | ICD-10-CM | POA: Diagnosis not present

## 2018-10-25 DIAGNOSIS — M869 Osteomyelitis, unspecified: Secondary | ICD-10-CM | POA: Diagnosis not present

## 2018-10-25 DIAGNOSIS — N186 End stage renal disease: Secondary | ICD-10-CM | POA: Diagnosis not present

## 2018-10-25 DIAGNOSIS — D631 Anemia in chronic kidney disease: Secondary | ICD-10-CM | POA: Diagnosis not present

## 2018-10-25 DIAGNOSIS — N2581 Secondary hyperparathyroidism of renal origin: Secondary | ICD-10-CM | POA: Diagnosis not present

## 2018-10-25 DIAGNOSIS — Z992 Dependence on renal dialysis: Secondary | ICD-10-CM | POA: Diagnosis not present

## 2018-10-27 DIAGNOSIS — M869 Osteomyelitis, unspecified: Secondary | ICD-10-CM | POA: Diagnosis not present

## 2018-10-27 DIAGNOSIS — N2581 Secondary hyperparathyroidism of renal origin: Secondary | ICD-10-CM | POA: Diagnosis not present

## 2018-10-27 DIAGNOSIS — N186 End stage renal disease: Secondary | ICD-10-CM | POA: Diagnosis not present

## 2018-10-27 DIAGNOSIS — D509 Iron deficiency anemia, unspecified: Secondary | ICD-10-CM | POA: Diagnosis not present

## 2018-10-27 DIAGNOSIS — D631 Anemia in chronic kidney disease: Secondary | ICD-10-CM | POA: Diagnosis not present

## 2018-10-27 DIAGNOSIS — Z992 Dependence on renal dialysis: Secondary | ICD-10-CM | POA: Diagnosis not present

## 2018-10-29 DIAGNOSIS — N186 End stage renal disease: Secondary | ICD-10-CM | POA: Diagnosis not present

## 2018-10-29 DIAGNOSIS — Z992 Dependence on renal dialysis: Secondary | ICD-10-CM | POA: Diagnosis not present

## 2018-10-29 DIAGNOSIS — M869 Osteomyelitis, unspecified: Secondary | ICD-10-CM | POA: Diagnosis not present

## 2018-10-29 DIAGNOSIS — D631 Anemia in chronic kidney disease: Secondary | ICD-10-CM | POA: Diagnosis not present

## 2018-10-29 DIAGNOSIS — N2581 Secondary hyperparathyroidism of renal origin: Secondary | ICD-10-CM | POA: Diagnosis not present

## 2018-10-29 DIAGNOSIS — D509 Iron deficiency anemia, unspecified: Secondary | ICD-10-CM | POA: Diagnosis not present

## 2018-11-01 DIAGNOSIS — M869 Osteomyelitis, unspecified: Secondary | ICD-10-CM | POA: Diagnosis not present

## 2018-11-01 DIAGNOSIS — D631 Anemia in chronic kidney disease: Secondary | ICD-10-CM | POA: Diagnosis not present

## 2018-11-01 DIAGNOSIS — N186 End stage renal disease: Secondary | ICD-10-CM | POA: Diagnosis not present

## 2018-11-01 DIAGNOSIS — N2581 Secondary hyperparathyroidism of renal origin: Secondary | ICD-10-CM | POA: Diagnosis not present

## 2018-11-01 DIAGNOSIS — Z992 Dependence on renal dialysis: Secondary | ICD-10-CM | POA: Diagnosis not present

## 2018-11-01 DIAGNOSIS — D509 Iron deficiency anemia, unspecified: Secondary | ICD-10-CM | POA: Diagnosis not present

## 2018-11-03 DIAGNOSIS — N186 End stage renal disease: Secondary | ICD-10-CM | POA: Diagnosis not present

## 2018-11-03 DIAGNOSIS — D631 Anemia in chronic kidney disease: Secondary | ICD-10-CM | POA: Diagnosis not present

## 2018-11-03 DIAGNOSIS — D509 Iron deficiency anemia, unspecified: Secondary | ICD-10-CM | POA: Diagnosis not present

## 2018-11-03 DIAGNOSIS — Z992 Dependence on renal dialysis: Secondary | ICD-10-CM | POA: Diagnosis not present

## 2018-11-03 DIAGNOSIS — N2581 Secondary hyperparathyroidism of renal origin: Secondary | ICD-10-CM | POA: Diagnosis not present

## 2018-11-03 DIAGNOSIS — M869 Osteomyelitis, unspecified: Secondary | ICD-10-CM | POA: Diagnosis not present

## 2018-11-04 ENCOUNTER — Other Ambulatory Visit: Payer: Self-pay

## 2018-11-04 ENCOUNTER — Ambulatory Visit (INDEPENDENT_AMBULATORY_CARE_PROVIDER_SITE_OTHER): Payer: Medicare Other | Admitting: Orthotics

## 2018-11-04 DIAGNOSIS — M2010 Hallux valgus (acquired), unspecified foot: Secondary | ICD-10-CM | POA: Diagnosis not present

## 2018-11-04 DIAGNOSIS — M2042 Other hammer toe(s) (acquired), left foot: Secondary | ICD-10-CM | POA: Diagnosis not present

## 2018-11-04 DIAGNOSIS — M2041 Other hammer toe(s) (acquired), right foot: Secondary | ICD-10-CM | POA: Diagnosis not present

## 2018-11-04 DIAGNOSIS — M21619 Bunion of unspecified foot: Secondary | ICD-10-CM

## 2018-11-04 DIAGNOSIS — M79672 Pain in left foot: Secondary | ICD-10-CM

## 2018-11-04 DIAGNOSIS — E1142 Type 2 diabetes mellitus with diabetic polyneuropathy: Secondary | ICD-10-CM

## 2018-11-04 DIAGNOSIS — M79671 Pain in right foot: Secondary | ICD-10-CM

## 2018-11-04 NOTE — Progress Notes (Signed)

## 2018-11-05 DIAGNOSIS — N2581 Secondary hyperparathyroidism of renal origin: Secondary | ICD-10-CM | POA: Diagnosis not present

## 2018-11-05 DIAGNOSIS — N186 End stage renal disease: Secondary | ICD-10-CM | POA: Diagnosis not present

## 2018-11-05 DIAGNOSIS — D509 Iron deficiency anemia, unspecified: Secondary | ICD-10-CM | POA: Diagnosis not present

## 2018-11-05 DIAGNOSIS — Z992 Dependence on renal dialysis: Secondary | ICD-10-CM | POA: Diagnosis not present

## 2018-11-05 DIAGNOSIS — D631 Anemia in chronic kidney disease: Secondary | ICD-10-CM | POA: Diagnosis not present

## 2018-11-05 DIAGNOSIS — M869 Osteomyelitis, unspecified: Secondary | ICD-10-CM | POA: Diagnosis not present

## 2018-11-08 DIAGNOSIS — D631 Anemia in chronic kidney disease: Secondary | ICD-10-CM | POA: Diagnosis not present

## 2018-11-08 DIAGNOSIS — N186 End stage renal disease: Secondary | ICD-10-CM | POA: Diagnosis not present

## 2018-11-08 DIAGNOSIS — D509 Iron deficiency anemia, unspecified: Secondary | ICD-10-CM | POA: Diagnosis not present

## 2018-11-08 DIAGNOSIS — Z992 Dependence on renal dialysis: Secondary | ICD-10-CM | POA: Diagnosis not present

## 2018-11-08 DIAGNOSIS — M869 Osteomyelitis, unspecified: Secondary | ICD-10-CM | POA: Diagnosis not present

## 2018-11-08 DIAGNOSIS — N2581 Secondary hyperparathyroidism of renal origin: Secondary | ICD-10-CM | POA: Diagnosis not present

## 2018-11-10 DIAGNOSIS — M869 Osteomyelitis, unspecified: Secondary | ICD-10-CM | POA: Diagnosis not present

## 2018-11-10 DIAGNOSIS — D509 Iron deficiency anemia, unspecified: Secondary | ICD-10-CM | POA: Diagnosis not present

## 2018-11-10 DIAGNOSIS — Z992 Dependence on renal dialysis: Secondary | ICD-10-CM | POA: Diagnosis not present

## 2018-11-10 DIAGNOSIS — E1142 Type 2 diabetes mellitus with diabetic polyneuropathy: Secondary | ICD-10-CM | POA: Diagnosis not present

## 2018-11-10 DIAGNOSIS — D631 Anemia in chronic kidney disease: Secondary | ICD-10-CM | POA: Diagnosis not present

## 2018-11-10 DIAGNOSIS — N186 End stage renal disease: Secondary | ICD-10-CM | POA: Diagnosis not present

## 2018-11-10 DIAGNOSIS — N2581 Secondary hyperparathyroidism of renal origin: Secondary | ICD-10-CM | POA: Diagnosis not present

## 2018-11-12 DIAGNOSIS — Z992 Dependence on renal dialysis: Secondary | ICD-10-CM | POA: Diagnosis not present

## 2018-11-12 DIAGNOSIS — M869 Osteomyelitis, unspecified: Secondary | ICD-10-CM | POA: Diagnosis not present

## 2018-11-12 DIAGNOSIS — N186 End stage renal disease: Secondary | ICD-10-CM | POA: Diagnosis not present

## 2018-11-12 DIAGNOSIS — D631 Anemia in chronic kidney disease: Secondary | ICD-10-CM | POA: Diagnosis not present

## 2018-11-12 DIAGNOSIS — D509 Iron deficiency anemia, unspecified: Secondary | ICD-10-CM | POA: Diagnosis not present

## 2018-11-12 DIAGNOSIS — N2581 Secondary hyperparathyroidism of renal origin: Secondary | ICD-10-CM | POA: Diagnosis not present

## 2018-11-15 ENCOUNTER — Ambulatory Visit (INDEPENDENT_AMBULATORY_CARE_PROVIDER_SITE_OTHER): Payer: Medicare Other

## 2018-11-15 ENCOUNTER — Encounter: Payer: Self-pay | Admitting: Podiatry

## 2018-11-15 ENCOUNTER — Ambulatory Visit (INDEPENDENT_AMBULATORY_CARE_PROVIDER_SITE_OTHER): Payer: Medicare Other | Admitting: Podiatry

## 2018-11-15 ENCOUNTER — Other Ambulatory Visit: Payer: Self-pay

## 2018-11-15 VITALS — Temp 97.3°F

## 2018-11-15 DIAGNOSIS — B351 Tinea unguium: Secondary | ICD-10-CM | POA: Diagnosis not present

## 2018-11-15 DIAGNOSIS — N2581 Secondary hyperparathyroidism of renal origin: Secondary | ICD-10-CM | POA: Diagnosis not present

## 2018-11-15 DIAGNOSIS — L02612 Cutaneous abscess of left foot: Secondary | ICD-10-CM | POA: Diagnosis not present

## 2018-11-15 DIAGNOSIS — E1142 Type 2 diabetes mellitus with diabetic polyneuropathy: Secondary | ICD-10-CM | POA: Diagnosis not present

## 2018-11-15 DIAGNOSIS — M869 Osteomyelitis, unspecified: Secondary | ICD-10-CM

## 2018-11-15 DIAGNOSIS — R768 Other specified abnormal immunological findings in serum: Secondary | ICD-10-CM | POA: Insufficient documentation

## 2018-11-15 DIAGNOSIS — E11319 Type 2 diabetes mellitus with unspecified diabetic retinopathy without macular edema: Secondary | ICD-10-CM | POA: Insufficient documentation

## 2018-11-15 DIAGNOSIS — Z992 Dependence on renal dialysis: Secondary | ICD-10-CM | POA: Diagnosis not present

## 2018-11-15 DIAGNOSIS — L0291 Cutaneous abscess, unspecified: Secondary | ICD-10-CM | POA: Diagnosis not present

## 2018-11-15 DIAGNOSIS — N186 End stage renal disease: Secondary | ICD-10-CM | POA: Diagnosis not present

## 2018-11-15 DIAGNOSIS — D509 Iron deficiency anemia, unspecified: Secondary | ICD-10-CM | POA: Diagnosis not present

## 2018-11-15 DIAGNOSIS — D631 Anemia in chronic kidney disease: Secondary | ICD-10-CM | POA: Diagnosis not present

## 2018-11-15 MED ORDER — AMOXICILLIN-POT CLAVULANATE 500-125 MG PO TABS: ORAL_TABLET | ORAL | 0 refills | Status: DC

## 2018-11-15 NOTE — Patient Instructions (Signed)
DRESSING CHANGES LEFT FOOT:  WEAR SURGICAL SHOE AT ALL TIMES    1. KEEP LEFT  FOOT DRY AT ALL TIMES!!!!  2. CLEANSE ULCER WITH SALINE.  3. DAB DRY WITH GAUZE SPONGE.  4. APPLY A LIGHT AMOUNT OF IODOSORB GEL TO BASE OF ULCER.  5. APPLY OUTER DRESSING/BAND-AID AS INSTRUCTED.  6. WEAR SURGICAL SHOE DAILY AT ALL TIMES.  7. DO NOT WALK BAREFOOT!!!  8.  IF YOU EXPERIENCE ANY FEVER, CHILLS, NIGHTSWEATS, NAUSEA OR VOMITING, ELEVATED OR LOW BLOOD SUGARS, REPORT TO EMERGENCY ROOM.  9. IF YOU EXPERIENCE INCREASED REDNESS, PAIN, SWELLING, DISCOLORATION, ODOR, PUS, DRAINAGE OR WARMTH OF YOUR FOOT, REPORT TO EMERGENCY ROOM.  Diabetes Mellitus and Foot Care Foot care is an important part of your health, especially when you have diabetes. Diabetes may cause you to have problems because of poor blood flow (circulation) to your feet and legs, which can cause your skin to:  Become thinner and drier.  Break more easily.  Heal more slowly.  Peel and crack. You may also have nerve damage (neuropathy) in your legs and feet, causing decreased feeling in them. This means that you may not notice minor injuries to your feet that could lead to more serious problems. Noticing and addressing any potential problems early is the best way to prevent future foot problems. How to care for your feet Foot hygiene  Wash your feet daily with warm water and mild soap. Do not use hot water. Then, pat your feet and the areas between your toes until they are completely dry. Do not soak your feet as this can dry your skin.  Trim your toenails straight across. Do not dig under them or around the cuticle. File the edges of your nails with an emery board or nail file.  Apply a moisturizing lotion or petroleum jelly to the skin on your feet and to dry, brittle toenails. Use lotion that does not contain alcohol and is unscented. Do not apply lotion between your toes. Shoes and socks  Wear clean socks or stockings every  day. Make sure they are not too tight. Do not wear knee-high stockings since they may decrease blood flow to your legs.  Wear shoes that fit properly and have enough cushioning. Always look in your shoes before you put them on to be sure there are no objects inside.  To break in new shoes, wear them for just a few hours a day. This prevents injuries on your feet. Wounds, scrapes, corns, and calluses  Check your feet daily for blisters, cuts, bruises, sores, and redness. If you cannot see the bottom of your feet, use a mirror or ask someone for help.  Do not cut corns or calluses or try to remove them with medicine.  If you find a minor scrape, cut, or break in the skin on your feet, keep it and the skin around it clean and dry. You may clean these areas with mild soap and water. Do not clean the area with peroxide, alcohol, or iodine.  If you have a wound, scrape, corn, or callus on your foot, look at it several times a day to make sure it is healing and not infected. Check for: ? Redness, swelling, or pain. ? Fluid or blood. ? Warmth. ? Pus or a bad smell. General instructions  Do not cross your legs. This may decrease blood flow to your feet.  Do not use heating pads or hot water bottles on your feet. They may burn your skin.  If you have lost feeling in your feet or legs, you may not know this is happening until it is too late.  Protect your feet from hot and cold by wearing shoes, such as at the beach or on hot pavement.  Schedule a complete foot exam at least once a year (annually) or more often if you have foot problems. If you have foot problems, report any cuts, sores, or bruises to your health care provider immediately. Contact a health care provider if:  You have a medical condition that increases your risk of infection and you have any cuts, sores, or bruises on your feet.  You have an injury that is not healing.  You have redness on your legs or feet.  You feel burning  or tingling in your legs or feet.  You have pain or cramps in your legs and feet.  Your legs or feet are numb.  Your feet always feel cold.  You have pain around a toenail. Get help right away if:  You have a wound, scrape, corn, or callus on your foot and: ? You have pain, swelling, or redness that gets worse. ? You have fluid or blood coming from the wound, scrape, corn, or callus. ? Your wound, scrape, corn, or callus feels warm to the touch. ? You have pus or a bad smell coming from the wound, scrape, corn, or callus. ? You have a fever. ? You have a red line going up your leg. Summary  Check your feet every day for cuts, sores, red spots, swelling, and blisters.  Moisturize feet and legs daily.  Wear shoes that fit properly and have enough cushioning.  If you have foot problems, report any cuts, sores, or bruises to your health care provider immediately.  Schedule a complete foot exam at least once a year (annually) or more often if you have foot problems. This information is not intended to replace advice given to you by your health care provider. Make sure you discuss any questions you have with your health care provider. Document Released: 07/04/2000 Document Revised: 08/19/2017 Document Reviewed: 08/08/2016 Elsevier Interactive Patient Education  2019 [employer].

## 2018-11-15 NOTE — Progress Notes (Addendum)
Subjective: Patient presents today for preventative diabetic foot care.  [name] is also on dialysis which she attends on MWF.    Today, patient is accompanied by her caregiver. Pt states she has a painful ingrown toenail of the left great toe for the past two weeks. She has noticed bloody drainage coming from the toe.  She states she has been performing epsom salt soaks.  She also  put turpentine on it yesterday. She denies any fever, chills, nightsweats, nausea or vomiting.   Cathy Solian, MD is her PCP. Last visit was   Allergies  Allergen Reactions  . Latex Hives  . Oxycodone Other (See Comments)    Hallucinations   . Tramadol Other (See Comments)    hallucinations   . Tape Rash    16M Transpore adhesive tape. Medical tape  . Vicodin [Hydrocodone-Acetaminophen] Other (See Comments)    hallucinations     Objective: Vitals:   11/15/18 1347  Temp: (!) 97.3 F (36.3 C)   Vascular Examination: Capillary refill time <4 seconds x 9 digits right foot; delayed left great toe.  Dorsalis pedis pulses faintly palpable b/l.  Posterior tibial pulses faintly palpable b/l.  Digital hair absent x 10 digits.  Skin temperature gradient warm  b/l  Dermatological Examination: Skin with normal turgor, texture and tone b/l.  Toenails 1-5 b/l discolored, thick, dystrophic with subungual debris and pain with palpation to nailbeds due to thickness of nails.          Subungual abscess noted medial border left hallux with seropurulent drainage. There is no gangrene appreciated. There is postinflammatory hyperpigmentation of digit. Digit does probe to subcutaneous tissue.  Musculoskeletal: Muscle strength 5/5 to all LE muscle groups  HAV with bunion b/l  Hammertoe 2nd digit b/l.  Neurological: Sensation diminished with 10 gram monofilament.  Vibratory sensation diminished.  Xrays left foot: Positive for calcified vessels Positive for cortical erosion of medial aspect  of distal phalanx.  Assessment: 1. Painful onychomycosis toenails 1-5 b/l  2. Ingrown toenail with abscess left hallux 3. Osteomyelitis distal phalanx left hallux 4. NIDDM withperipheral neuropathy  Plan: 1. Toenails 1-5 b/l were debrided in length and girth without iatrogenic bleeding. 2. Incision and drainage left hallux. Culture taken today. Alcohol flush performed. Wound cleansed with wound cleanser. Iodosorb Gel and light dressing applied to base of wound.  3. Xray of left foot was performed and reviewed with patient/caregiver. Discussed osteomyelitis and treatment options.  4. Prescription for renal dosing of Augmentin 500 mg po q24h on Tuesday, Thursday and Saturday. Will arrange with dialysis center for administration of antibiotic during dialysis on MWF. 5. Arrange Vascular consult for potential to heal. 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 Will investigate or refer her to Vein and Vascular for new ABI studies. 6. Surgical shoe was dispensed for left foot. 7. Patient was given instructions dressing changes and was instructed to call immediately if any signs or symptoms of infection arise.  8. Patient/caregiver instructed to report to emergency department with worsening appearance of toe/foot, increased pain, foul odor, increased redness, swelling, drainage, fever, chills, nightsweats, nausea, vomiting, increased blood sugar.  9. Patient/caregiver related understanding. 10. Follow up next week with [name]. 11. Patient/POA to call should there be a concern in the interim.  11/16/2018 @14 :30: Spoke to charge Nurse [name] at [facility]. Ordered Ancef 1 gram IV q 48 hours after dialysis treatments on MWF. Informed charge Nurse [name], we will modify based on culture/sensitivity  report once received. Patient to discontinue Augmentin prescription today. JLG.

## 2018-11-16 ENCOUNTER — Telehealth: Payer: Self-pay | Admitting: *Deleted

## 2018-11-16 DIAGNOSIS — M869 Osteomyelitis, unspecified: Secondary | ICD-10-CM | POA: Insufficient documentation

## 2018-11-16 NOTE — Telephone Encounter (Signed)
[name] states pt has dialysis with [facility] [phone]. Dr. Elisha Ponder states she would like pt to receive IV antibiotic in dialysis Monday, Wednesday and Friday.

## 2018-11-16 NOTE — Telephone Encounter (Signed)
I informed Dr. Elisha Ponder of the information from [facility] concerning IV medications they have at the center.

## 2018-11-16 NOTE — Telephone Encounter (Signed)
I informed pt of Dr. Heber Achille orders to stop the Augmentin not to take anymore, and that she would receive her antibiotic in her dialysis. Pt states understanding.

## 2018-11-16 NOTE — Telephone Encounter (Signed)
Dr. Elisha Ponder ordered Ancef 1g every 48 hours for 10 doses, and pt is to stop the oral Augmentin.

## 2018-11-16 NOTE — Telephone Encounter (Signed)
I spoke with pt's sister, Jobie Quaker states she has pt's dialysis number and will call back because she can not get it while on the phone.

## 2018-11-16 NOTE — Telephone Encounter (Signed)
I was on the phone with another pt and saw [place] on my call screen. I called [facility] and she states they do not have Amoxicillin IV, they do have Vancomycin, Ancef, Ceftazidine. I told Elmyra Ricks I would contact Dr. Elisha Ponder for alternate orders and call again. Pt is scheduled for dialysis tomorrow 7:30am to 11:30am.

## 2018-11-16 NOTE — Addendum Note (Signed)
Addended by: Teresita Madura on: 11/16/2018 09:38 AM   Modules accepted: Orders

## 2018-11-16 NOTE — Telephone Encounter (Signed)
I called B. Alben Spittle

## 2018-11-16 NOTE — Telephone Encounter (Signed)
[name], pt's sister asked if I was the nurse that informed pt she was not to take anymore of the oral antibiotic and why. I told [name], Dr. Elisha Ponder ordered antibiotic IV through pt's dialysis and would not need the Augmentin. [name] states she gives pt her medication and wanted to make sure of the new orders. I told her, that was very good that she called.

## 2018-11-16 NOTE — Telephone Encounter (Signed)
Dr. Elisha Ponder ordered Amoxicillin 500mg  IV Monday, Wednesday and Friday after dialysis for 10 days. I called [facility] requested the orders be faxed to [phone]. Faxed orders to [facility].

## 2018-11-16 NOTE — Telephone Encounter (Signed)
-----   Message from Marzetta Board, DPM sent at 11/15/2018  5:44 PM EDT ----- Regarding: Vascular Referral Hi Val!  Diabetic patient on hemodialysis seen today.   CC: painful ingrown toenail left hallux medial border which had been draining blood. Denies fever, chills, nightsweats, nausea, vomiting.  She had abscess left great toe with xray confirming osteomyelitis distal tuft. Abscess drained. Toe cultured today.   Concerned about circulation and would like her to see Vascular for ABIs.  Patient stated she had seen a Dr. Augustin Coupe in the past for her fistula. So not sure if we should send her back to him or refer to Vein and Vascular, but would like her seen ASAP.    Have her on renal dose Augmentin. 500 mg po q24h on Tues Thurs Saturday and will contact dialysis center to administer on dialysis days (MWF).  Scheduling her to f/u with Dr. March Rummage next week.   Thanks!  Dr. Elisha Ponder

## 2018-11-16 NOTE — Addendum Note (Signed)
Addended by: Harriett Sine D on: 11/16/2018 09:32 AM   Modules accepted: Orders

## 2018-11-17 DIAGNOSIS — N2581 Secondary hyperparathyroidism of renal origin: Secondary | ICD-10-CM | POA: Diagnosis not present

## 2018-11-17 DIAGNOSIS — M869 Osteomyelitis, unspecified: Secondary | ICD-10-CM | POA: Diagnosis not present

## 2018-11-17 DIAGNOSIS — Z992 Dependence on renal dialysis: Secondary | ICD-10-CM | POA: Diagnosis not present

## 2018-11-17 DIAGNOSIS — D509 Iron deficiency anemia, unspecified: Secondary | ICD-10-CM | POA: Diagnosis not present

## 2018-11-17 DIAGNOSIS — N186 End stage renal disease: Secondary | ICD-10-CM | POA: Diagnosis not present

## 2018-11-17 DIAGNOSIS — D631 Anemia in chronic kidney disease: Secondary | ICD-10-CM | POA: Diagnosis not present

## 2018-11-19 ENCOUNTER — Other Ambulatory Visit: Payer: Self-pay

## 2018-11-19 DIAGNOSIS — E1129 Type 2 diabetes mellitus with other diabetic kidney complication: Secondary | ICD-10-CM | POA: Diagnosis not present

## 2018-11-19 DIAGNOSIS — Z992 Dependence on renal dialysis: Secondary | ICD-10-CM | POA: Diagnosis not present

## 2018-11-19 DIAGNOSIS — N186 End stage renal disease: Secondary | ICD-10-CM | POA: Diagnosis not present

## 2018-11-19 DIAGNOSIS — D631 Anemia in chronic kidney disease: Secondary | ICD-10-CM | POA: Diagnosis not present

## 2018-11-19 DIAGNOSIS — M869 Osteomyelitis, unspecified: Secondary | ICD-10-CM | POA: Diagnosis not present

## 2018-11-19 DIAGNOSIS — N2581 Secondary hyperparathyroidism of renal origin: Secondary | ICD-10-CM | POA: Diagnosis not present

## 2018-11-19 LAB — WOUND CULTURE
MICRO NUMBER:: 428641
SPECIMEN QUALITY:: ADEQUATE

## 2018-11-22 DIAGNOSIS — M869 Osteomyelitis, unspecified: Secondary | ICD-10-CM | POA: Diagnosis not present

## 2018-11-22 DIAGNOSIS — Z992 Dependence on renal dialysis: Secondary | ICD-10-CM | POA: Diagnosis not present

## 2018-11-22 DIAGNOSIS — N2581 Secondary hyperparathyroidism of renal origin: Secondary | ICD-10-CM | POA: Diagnosis not present

## 2018-11-22 DIAGNOSIS — E1129 Type 2 diabetes mellitus with other diabetic kidney complication: Secondary | ICD-10-CM | POA: Diagnosis not present

## 2018-11-22 DIAGNOSIS — N186 End stage renal disease: Secondary | ICD-10-CM | POA: Diagnosis not present

## 2018-11-22 DIAGNOSIS — D631 Anemia in chronic kidney disease: Secondary | ICD-10-CM | POA: Diagnosis not present

## 2018-11-23 ENCOUNTER — Ambulatory Visit (INDEPENDENT_AMBULATORY_CARE_PROVIDER_SITE_OTHER): Payer: Medicare Other | Admitting: Vascular Surgery

## 2018-11-23 ENCOUNTER — Encounter: Payer: Self-pay | Admitting: Vascular Surgery

## 2018-11-23 ENCOUNTER — Ambulatory Visit (HOSPITAL_COMMUNITY)
Admission: RE | Admit: 2018-11-23 | Discharge: 2018-11-23 | Disposition: A | Discharge status: Home / Self Care | Payer: Medicare Other | Source: Ambulatory Visit | Attending: Family | Admitting: Family

## 2018-11-23 ENCOUNTER — Other Ambulatory Visit: Payer: Self-pay | Admitting: *Deleted

## 2018-11-23 ENCOUNTER — Other Ambulatory Visit: Payer: Self-pay

## 2018-11-23 ENCOUNTER — Encounter: Payer: Self-pay | Admitting: *Deleted

## 2018-11-23 VITALS — BP 175/82 | HR 87 | Resp 18 | Ht 65.0 in | Wt 206.0 lb

## 2018-11-23 DIAGNOSIS — E1169 Type 2 diabetes mellitus with other specified complication: Secondary | ICD-10-CM | POA: Diagnosis not present

## 2018-11-23 DIAGNOSIS — M869 Osteomyelitis, unspecified: Secondary | ICD-10-CM

## 2018-11-23 DIAGNOSIS — N186 End stage renal disease: Secondary | ICD-10-CM

## 2018-11-23 DIAGNOSIS — Z992 Dependence on renal dialysis: Secondary | ICD-10-CM

## 2018-11-23 NOTE — Progress Notes (Signed)
Vascular and Vein Specialist of [place]  Patient name: [name] MRN: [id number] DOB: 11-29-1950 Sex: female  REASON FOR CONSULT: Evaluation osteomyelitis left great toe  HPI: [name] is a 68 y.o. female, who is seen today for evaluation of left toe infection.  [facility] with an ingrown toenail.  Plain films of her foot at that time revealed osteomyelitis of her tip of her great toe bone.  She is end-stage renal disease a long history of diabetes.  Is not have any rest pain.  She does have a deformity of her feet with bunion bilaterally.  No claudication and no rest pain.  She does not walk very actively.  Past Medical History:  Diagnosis Date  . Anemia   . Anxiety   . Arthritis    "joints" (06/15/2013)  . Asthma   . 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 Blood transfusion without reported diagnosis   . Breast cancer ([place])    left  . CHF (congestive heart failure) ([place])   . 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 Esophageal ulcer with bleeding   . ESRD (end stage renal disease) ([place])    "suppose to start dialysis today" (06/15/2013)  . GERD (gastroesophageal reflux disease)   . Glaucoma    blind in both eyes  . Heart murmur   . Hx of adenomatous colonic polyps 04/07/2018  . Hypertension   . Myalgia 12/31/2011  . Neuropathy 12/31/2011  . Shortness of breath    "when she doesn't go to dialysis"  . Type II diabetes mellitus (HCC)     Family History  Problem Relation Age of Onset  . Diabetes Mother   . Hyperlipidemia Mother   . Hypertension Mother   . Hypertension Father   . Diabetes Sister   . Diabetes Brother   . Hypertension Brother   . Heart attack Brother   . Kidney disease Brother   . Colon cancer Neg Hx   . Colon polyps Neg Hx   . Esophageal cancer Neg Hx   . Gallbladder disease Neg Hx   . Rectal cancer Neg Hx   . Stomach cancer Neg Hx     SOCIAL HISTORY:  Social History   Socioeconomic History  . Marital status: Single    Spouse name: Not on file  . Number of children: 2  . Years of education: 57  . Highest education level: Not on file  Occupational History  . Occupation: Diabled  Social Needs  . Financial resource strain: Not hard at all  . Food insecurity:    Worry: Never true    Inability: Never true  . Transportation needs:    Medical: No    Non-medical: No  Tobacco Use  . Smoking status: Never Smoker  . Smokeless tobacco: Never Used  Substance and Sexual Activity  . Alcohol use: No  . Drug use: No  . Sexual activity: Never  Lifestyle  . Physical activity:    Days per week: 0 days    Minutes per session: 0 min  . Stress: Not at all  Relationships  . Social connections:    Talks on phone: Three times a week    Gets together: Three times a week    Attends religious service: 1 to 4 times per year    Active member of club or organization: No    Attends meetings of clubs or organizations:  Never    Relationship status: Never married  . Intimate partner violence:    Fear of current or ex partner: No    Emotionally abused: No    Physically abused: No    Forced sexual activity: No  Other Topics Concern  . Not on file  Social History Narrative   She reports she is single with one son and one daughter. 3 caffeinated beverages a day. She dialyzes Monday Wednesday Friday at the [facility].   11/01/2014    Allergies  Allergen Reactions  . Latex Hives  . Oxycodone Other (See Comments)    Hallucinations   . Tramadol Other (See Comments)    hallucinations   . Tape Rash    14M Transpore adhesive tape. Medical tape  . Vicodin [Hydrocodone-Acetaminophen] Other (See Comments)    hallucinations    Current Outpatient Medications  Medication Sig Dispense Refill  . acetaminophen (TYLENOL) 500 MG tablet Take 500 mg by mouth every 8 (eight) hours as needed for moderate pain.     Marland Kitchen albuterol (PROVENTIL) (2.5  MG/14ML) 0.083% nebulizer solution Take 3 mLs (2.5 mg total) by nebulization every 6 (six) hours as needed for wheezing or shortness of breath. 75 mL 12  . amLODipine (NORVASC) 5 MG tablet Take 5 mg by mouth daily.     Marland Kitchen amoxicillin-clavulanate (AUGMENTIN) 500-125 MG tablet Take 1 tablet by mouth qd on Tuesday, Thursday and Saturday. 10 tablet 0  . aspirin 81 MG chewable tablet Chew 81 mg by mouth daily with lunch.     Marland Kitchen atropine 1 % ophthalmic solution Place 1 drop into the right eye 2 (two) times daily.  0  . B Complex-C-Folic Acid (RENA-VITE RX) 1 MG TABS Take 1 tablet by mouth daily with lunch.   1  . carvedilol (COREG) 25 MG tablet Take 25 mg by mouth 2 (two) times daily.  2  . darbepoetin (ARANESP) 200 MCG/0.4ML SOLN injection Inject 0.4 mLs (200 mcg total) into the vein every Wednesday with hemodialysis. 1.68 mL   . docusate sodium (COLACE) 100 MG capsule Take 100 mg by mouth daily.    Marland Kitchen doxercalciferol (HECTOROL) 4 MCG/2ML injection Inject 0.5 mLs (1 mcg total) into the vein every Monday, Wednesday, and Friday with hemodialysis. 2 mL   . gabapentin (NEURONTIN) 100 MG capsule Take 100 mg by mouth 2 (two) times daily.    . insulin aspart (NOVOLOG FLEXPEN) 100 UNIT/ML FlexPen Inject 6-10 Units into the skin 3 (three) times daily with meals. per Sliding scale    . lanthanum (FOSRENOL) 1000 MG chewable tablet Chew 2,000 mg by mouth 3 (three) times daily with meals.     Marland Kitchen lisinopril (PRINIVIL,ZESTRIL) 40 MG tablet Take 40 mg by mouth at bedtime.     Marland Kitchen omeprazole (PRILOSEC) 40 MG capsule Take 1 capsule (40 mg total) by mouth daily. (Patient taking differently: Take 40 mg by mouth 2 (two) times daily. )    . ONETOUCH VERIO test strip 3 (three) times daily. for testing  9  . prednisoLONE acetate (PRED FORTE) 1 % ophthalmic suspension Place 1 drop into the right eye 2 (two) times daily.   0   Current Facility-Administered Medications  Medication Dose Route Frequency Provider Last Rate Last Dose  .  0.9 %  sodium chloride infusion  500 mL Intravenous Once [name], MD        REVIEW OF SYSTEMS:  [X]  denotes positive finding, [ ]  denotes negative finding Cardiac  Comments:  Chest pain or chest pressure:    Shortness of breath upon exertion:    Short of breath when lying flat:    Irregular heart rhythm:        Vascular    Pain in calf, thigh, or hip brought on by ambulation:    Pain in feet at night that wakes you up from your sleep:  x   Blood clot in your veins:    Leg swelling:  x       Pulmonary    Oxygen at home:    Productive cough:     Wheezing:         Neurologic    Sudden weakness in arms or legs:     Sudden numbness in arms or legs:     Sudden onset of difficulty speaking or slurred speech:    Temporary loss of vision in one eye:     Problems with dizziness:         Gastrointestinal    Blood in stool:     Vomited blood:         Genitourinary    Burning when urinating:     Blood in urine:        Psychiatric    Major depression:         Hematologic    Bleeding problems:    Problems with blood clotting too easily:        Skin    Rashes or ulcers:        Constitutional    Fever or chills:      PHYSICAL EXAM: Vitals:   11/23/18 0957  BP: (!) 175/82  Pulse: 87  Resp: 18  SpO2: 93%  Weight: 206 lb (93.4 kg)  Height: 5\' 5"  (1.651 m)    GENERAL: The patient is a well-nourished female, in no acute distress. The vital signs are documented above. CARDIOVASCULAR: 2+ radial and 2+ popliteal pulses bilaterally.  Absent pedal pulses bilaterally. PULMONARY: There is good air exchange  ABDOMEN: Soft and non-tender  MUSCULOSKELETAL: There are no major deformities or cyanosis. NEUROLOGIC: No focal weakness or paresthesias are detected. SKIN: Open ulceration at the medial aspect of her left great toenail PSYCHIATRIC: The patient has a normal affect.  DATA:  Noninvasive studies reveal monophasic flows bilaterally with calcified vessels making ankle  arm index not reliable  MEDICAL ISSUES: 2 myelitis and infection of her left distal great toe.  Easily palpable popliteal pulses with monophasic flow with tibial vessel disease.  Discussed this at length with the patient and her family member present.  Recommend arteriography and possible intervention for limb salvage.  Certainly concern regarding her ability to heal her toe with her current level of flow we will schedule outpatient arteriogram at her earliest [place]. Zahriah Roes, MD [facility] Tel [phone] Pager [phone]

## 2018-11-23 NOTE — Progress Notes (Signed)
Reviewed pre-procedure instructions / insulin adjustments with patient and family. Verbalized understanding.

## 2018-11-23 NOTE — H&P (View-Only) (Signed)
Vascular and Vein Specialist of [place]  Patient name: [name] MRN: [id number] DOB: 04/26/1951 Sex: female  REASON FOR CONSULT: Evaluation osteomyelitis left great toe  HPI: [name] is a 68 y.o. female, who is seen today for evaluation of left toe infection.  [facility] with an ingrown toenail.  Plain films of her foot at that time revealed osteomyelitis of her tip of her great toe bone.  She is end-stage renal disease a long history of diabetes.  Is not have any rest pain.  She does have a deformity of her feet with bunion bilaterally.  No claudication and no rest pain.  She does not walk very actively.  Past Medical History:  Diagnosis Date  . Anemia   . Anxiety   . Arthritis    "joints" (06/15/2013)  . Asthma   . 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 Blood transfusion without reported diagnosis   . Breast cancer ([place])    left  . CHF (congestive heart failure) ([place])   . 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 Esophageal ulcer with bleeding   . ESRD (end stage renal disease) ([place])    "suppose to start dialysis today" (06/15/2013)  . GERD (gastroesophageal reflux disease)   . Glaucoma    blind in both eyes  . Heart murmur   . Hx of adenomatous colonic polyps 04/07/2018  . Hypertension   . Myalgia 12/31/2011  . Neuropathy 12/31/2011  . Shortness of breath    "when she doesn't go to dialysis"  . Type II diabetes mellitus (HCC)     Family History  Problem Relation Age of Onset  . Diabetes Mother   . Hyperlipidemia Mother   . Hypertension Mother   . Hypertension Father   . Diabetes Sister   . Diabetes Brother   . Hypertension Brother   . Heart attack Brother   . Kidney disease Brother   . Colon cancer Neg Hx   . Colon polyps Neg Hx   . Esophageal cancer Neg Hx   . Gallbladder disease Neg Hx   . Rectal cancer Neg Hx   . Stomach cancer Neg Hx     SOCIAL HISTORY:  Social History   Socioeconomic History  . Marital status: Single    Spouse name: Not on file  . Number of children: 2  . Years of education: 2  . Highest education level: Not on file  Occupational History  . Occupation: Diabled  Social Needs  . Financial resource strain: Not hard at all  . Food insecurity:    Worry: Never true    Inability: Never true  . Transportation needs:    Medical: No    Non-medical: No  Tobacco Use  . Smoking status: Never Smoker  . Smokeless tobacco: Never Used  Substance and Sexual Activity  . Alcohol use: No  . Drug use: No  . Sexual activity: Never  Lifestyle  . Physical activity:    Days per week: 0 days    Minutes per session: 0 min  . Stress: Not at all  Relationships  . Social connections:    Talks on phone: Three times a week    Gets together: Three times a week    Attends religious service: 1 to 4 times per year    Active member of club or organization: No    Attends meetings of clubs or organizations:  Never    Relationship status: Never married  . Intimate partner violence:    Fear of current or ex partner: No    Emotionally abused: No    Physically abused: No    Forced sexual activity: No  Other Topics Concern  . Not on file  Social History Narrative   She reports she is single with one son and one daughter. 3 caffeinated beverages a day. She dialyzes Monday Wednesday Friday at the [facility].   11/01/2014    Allergies  Allergen Reactions  . Latex Hives  . Oxycodone Other (See Comments)    Hallucinations   . Tramadol Other (See Comments)    hallucinations   . Tape Rash    57M Transpore adhesive tape. Medical tape  . Vicodin [Hydrocodone-Acetaminophen] Other (See Comments)    hallucinations    Current Outpatient Medications  Medication Sig Dispense Refill  . acetaminophen (TYLENOL) 500 MG tablet Take 500 mg by mouth every 8 (eight) hours as needed for moderate pain.     Marland Kitchen albuterol (PROVENTIL) (2.5  MG/57ML) 0.083% nebulizer solution Take 3 mLs (2.5 mg total) by nebulization every 6 (six) hours as needed for wheezing or shortness of breath. 75 mL 12  . amLODipine (NORVASC) 5 MG tablet Take 5 mg by mouth daily.     Marland Kitchen amoxicillin-clavulanate (AUGMENTIN) 500-125 MG tablet Take 1 tablet by mouth qd on Tuesday, Thursday and Saturday. 10 tablet 0  . aspirin 81 MG chewable tablet Chew 81 mg by mouth daily with lunch.     Marland Kitchen atropine 1 % ophthalmic solution Place 1 drop into the right eye 2 (two) times daily.  0  . B Complex-C-Folic Acid (RENA-VITE RX) 1 MG TABS Take 1 tablet by mouth daily with lunch.   1  . carvedilol (COREG) 25 MG tablet Take 25 mg by mouth 2 (two) times daily.  2  . darbepoetin (ARANESP) 200 MCG/0.4ML SOLN injection Inject 0.4 mLs (200 mcg total) into the vein every Wednesday with hemodialysis. 1.68 mL   . docusate sodium (COLACE) 100 MG capsule Take 100 mg by mouth daily.    Marland Kitchen doxercalciferol (HECTOROL) 4 MCG/2ML injection Inject 0.5 mLs (1 mcg total) into the vein every Monday, Wednesday, and Friday with hemodialysis. 2 mL   . gabapentin (NEURONTIN) 100 MG capsule Take 100 mg by mouth 2 (two) times daily.    . insulin aspart (NOVOLOG FLEXPEN) 100 UNIT/ML FlexPen Inject 6-10 Units into the skin 3 (three) times daily with meals. per Sliding scale    . lanthanum (FOSRENOL) 1000 MG chewable tablet Chew 2,000 mg by mouth 3 (three) times daily with meals.     Marland Kitchen lisinopril (PRINIVIL,ZESTRIL) 40 MG tablet Take 40 mg by mouth at bedtime.     Marland Kitchen omeprazole (PRILOSEC) 40 MG capsule Take 1 capsule (40 mg total) by mouth daily. (Patient taking differently: Take 40 mg by mouth 2 (two) times daily. )    . ONETOUCH VERIO test strip 3 (three) times daily. for testing  9  . prednisoLONE acetate (PRED FORTE) 1 % ophthalmic suspension Place 1 drop into the right eye 2 (two) times daily.   0   Current Facility-Administered Medications  Medication Dose Route Frequency Provider Last Rate Last Dose  .  0.9 %  sodium chloride infusion  500 mL Intravenous Once [name], MD        REVIEW OF SYSTEMS:  [X]  denotes positive finding, [ ]  denotes negative finding Cardiac  Comments:  Chest pain or chest pressure:    Shortness of breath upon exertion:    Short of breath when lying flat:    Irregular heart rhythm:        Vascular    Pain in calf, thigh, or hip brought on by ambulation:    Pain in feet at night that wakes you up from your sleep:  x   Blood clot in your veins:    Leg swelling:  x       Pulmonary    Oxygen at home:    Productive cough:     Wheezing:         Neurologic    Sudden weakness in arms or legs:     Sudden numbness in arms or legs:     Sudden onset of difficulty speaking or slurred speech:    Temporary loss of vision in one eye:     Problems with dizziness:         Gastrointestinal    Blood in stool:     Vomited blood:         Genitourinary    Burning when urinating:     Blood in urine:        Psychiatric    Major depression:         Hematologic    Bleeding problems:    Problems with blood clotting too easily:        Skin    Rashes or ulcers:        Constitutional    Fever or chills:      PHYSICAL EXAM: Vitals:   11/23/18 0957  BP: (!) 175/82  Pulse: 87  Resp: 18  SpO2: 93%  Weight: 206 lb (93.4 kg)  Height: 5\' 5"  (1.651 m)    GENERAL: The patient is a well-nourished female, in no acute distress. The vital signs are documented above. CARDIOVASCULAR: 2+ radial and 2+ popliteal pulses bilaterally.  Absent pedal pulses bilaterally. PULMONARY: There is good air exchange  ABDOMEN: Soft and non-tender  MUSCULOSKELETAL: There are no major deformities or cyanosis. NEUROLOGIC: No focal weakness or paresthesias are detected. SKIN: Open ulceration at the medial aspect of her left great toenail PSYCHIATRIC: The patient has a normal affect.  DATA:  Noninvasive studies reveal monophasic flows bilaterally with calcified vessels making ankle  arm index not reliable  MEDICAL ISSUES: 2 myelitis and infection of her left distal great toe.  Easily palpable popliteal pulses with monophasic flow with tibial vessel disease.  Discussed this at length with the patient and her family member present.  Recommend arteriography and possible intervention for limb salvage.  Certainly concern regarding her ability to heal her toe with her current level of flow we will schedule outpatient arteriogram at her earliest [place]. Christopherjame Carnell, MD [facility] Vascular and Vein Specialists of [facility] Tel [phone] Pager [phone]

## 2018-11-24 DIAGNOSIS — N2581 Secondary hyperparathyroidism of renal origin: Secondary | ICD-10-CM | POA: Diagnosis not present

## 2018-11-24 DIAGNOSIS — D631 Anemia in chronic kidney disease: Secondary | ICD-10-CM | POA: Diagnosis not present

## 2018-11-24 DIAGNOSIS — M869 Osteomyelitis, unspecified: Secondary | ICD-10-CM | POA: Diagnosis not present

## 2018-11-24 DIAGNOSIS — E1129 Type 2 diabetes mellitus with other diabetic kidney complication: Secondary | ICD-10-CM | POA: Diagnosis not present

## 2018-11-24 DIAGNOSIS — N186 End stage renal disease: Secondary | ICD-10-CM | POA: Diagnosis not present

## 2018-11-24 DIAGNOSIS — Z992 Dependence on renal dialysis: Secondary | ICD-10-CM | POA: Diagnosis not present

## 2018-11-25 ENCOUNTER — Ambulatory Visit (INDEPENDENT_AMBULATORY_CARE_PROVIDER_SITE_OTHER): Payer: Medicare Other | Admitting: Podiatry

## 2018-11-25 ENCOUNTER — Other Ambulatory Visit: Payer: Self-pay

## 2018-11-25 VITALS — Temp 97.2°F

## 2018-11-25 DIAGNOSIS — L02612 Cutaneous abscess of left foot: Secondary | ICD-10-CM

## 2018-11-25 DIAGNOSIS — E1142 Type 2 diabetes mellitus with diabetic polyneuropathy: Secondary | ICD-10-CM

## 2018-11-25 DIAGNOSIS — M869 Osteomyelitis, unspecified: Secondary | ICD-10-CM

## 2018-11-25 NOTE — Progress Notes (Signed)
Patient has dialysis on MWF.  Patient will need Covid testing on day of procedure.

## 2018-11-26 ENCOUNTER — Ambulatory Visit (INDEPENDENT_AMBULATORY_CARE_PROVIDER_SITE_OTHER): Payer: Medicare Other | Admitting: Family Medicine

## 2018-11-26 ENCOUNTER — Encounter: Payer: Self-pay | Admitting: Family Medicine

## 2018-11-26 ENCOUNTER — Other Ambulatory Visit: Payer: Self-pay

## 2018-11-26 DIAGNOSIS — M869 Osteomyelitis, unspecified: Secondary | ICD-10-CM | POA: Diagnosis not present

## 2018-11-26 DIAGNOSIS — M5442 Lumbago with sciatica, left side: Secondary | ICD-10-CM

## 2018-11-26 DIAGNOSIS — N2581 Secondary hyperparathyroidism of renal origin: Secondary | ICD-10-CM | POA: Diagnosis not present

## 2018-11-26 DIAGNOSIS — Z992 Dependence on renal dialysis: Secondary | ICD-10-CM | POA: Diagnosis not present

## 2018-11-26 DIAGNOSIS — D631 Anemia in chronic kidney disease: Secondary | ICD-10-CM | POA: Diagnosis not present

## 2018-11-26 DIAGNOSIS — E1129 Type 2 diabetes mellitus with other diabetic kidney complication: Secondary | ICD-10-CM | POA: Diagnosis not present

## 2018-11-26 DIAGNOSIS — N186 End stage renal disease: Secondary | ICD-10-CM | POA: Diagnosis not present

## 2018-11-26 MED ORDER — DEXAMETHASONE SODIUM PHOSPHATE 4 MG/ML IJ SOLN: 4.0000 mg | Freq: Once | INTRAMUSCULAR | Status: DC

## 2018-11-26 MED ORDER — METHYLPREDNISOLONE ACETATE 40 MG/ML IJ SUSP: 40.0000 mg | Freq: Once | INTRAMUSCULAR | Status: DC

## 2018-11-26 NOTE — Progress Notes (Signed)
Office Visit Note   Patient: [name]           Date of Birth: 1951-03-23           MRN: [id number] Visit Date: 11/26/2018 Requested by: Prince Solian, MD [address] [place], [place] [address] PCP: Prince Solian, MD  Subjective: Chief Complaint  Patient presents with  . Lower Back - Pain    Pain in the lower back with pain radiating down the left leg x 1 month. NKI. No numbness/tingling in leg/foot. Hurts to walk and stand x last 3 weeks and worsening. Now hurts to sit long.    HPI: She is here with low back and left leg pain.  Symptoms started about 1 month ago, no injury.  Gradual onset of pain in the lower back with radiation into the buttocks and down the back of the left leg toward the knee.  It is worse when standing and trying to walk, better when sitting although lately has been hurting even while sitting.  Denies bowel and bladder dysfunction.  Blood sugars have been stable.  She cannot recall any trauma that might of contributed to this.  Denies fevers or chills.  She states that her PCP took x-rays a few weeks ago and they were negative for acute compression fracture but positive for arthritis.               ROS:   She has end-stage renal disease on dialysis.  All other systems were reviewed and are negative.  Objective: Vital Signs: There were no vitals taken for this visit.  Physical Exam:  General:  Alert and oriented, in no acute distress. Pulm:  Breathing unlabored. Psy:  Normal mood, congruent affect. Skin: No rash on her skin. Low back: Diffuse tenderness along the lumbar spinous processes.  Moderate tenderness in the left sciatic notch.  No pain with internal hip rotation, negative straight leg raise.  Lower extremity strength and reflexes are normal.  Imaging: None today.  Assessment & Plan: 1.  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 -Discussed options with patient and her daughter, elected to give her an  intramuscular steroid injection today.  She will watch blood sugars closely.  If severe pain persists, we will proceed with lumbar MRI scan followed by possible epidural injection.     Procedures: No procedures performed  No notes on file     PMFS History: Patient Active Problem List   Diagnosis Date Noted  . Diabetic retinopathy associated with type 2 diabetes mellitus ([place]) 11/15/2018  . Rheumatoid factor positive 11/15/2018  . Hemoptysis 05/06/2018  . Anemia in ESRD (end-stage renal disease) ([place]) 05/06/2018  . Asthma 05/06/2018  . GIB (gastrointestinal bleeding) 05/06/2018  . Hx of adenomatous colonic polyps 04/07/2018  . Status post carpal tunnel release 12/10/2017  . Carpal tunnel syndrome, left upper limb 11/10/2017  . Carpal tunnel syndrome, right upper limb 11/10/2017  . Bilateral hand numbness 09/28/2017  . Abdominal discomfort, epigastric   . Hypertension 07/06/2017  . Chronic combined systolic and diastolic CHF (congestive heart failure) ([place]) 07/06/2017  . Complicated migraine 70/62/3762  . ESRD on dialysis ([place]) 06/04/2017  . Blindness of both eyes 06/04/2017  . Glaucoma 06/04/2017  . Essential hypertension 10/27/2016  . GERD (gastroesophageal reflux disease) 10/27/2016  . Abnormal stress test 03/15/2015  . Poor venous access 02/08/2015  . Breast cancer ([place]) 11/02/2014  . Type II diabetes mellitus with renal manifestations ([place]) 06/15/2013  . Chronic  kidney disease (CKD), stage IV (severe) ([place]) 06/03/2013  . Neuropathy 12/31/2011  . Breast cancer of upper-inner quadrant of left female breast ([place]) 06/27/2011   Past Medical History:  Diagnosis Date  . Anemia   . Anxiety   . Arthritis    "joints" (06/15/2013)  . Asthma   . 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 Blood transfusion without reported diagnosis   . Breast cancer ([place])    left  . CHF (congestive heart failure) ([place])   . 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  Esophageal ulcer with bleeding   . ESRD (end stage renal disease) ([place])    "suppose to start dialysis today" (06/15/2013)  . GERD (gastroesophageal reflux disease)   . Glaucoma    blind in both eyes  . Heart murmur   . Hx of adenomatous colonic polyps 04/07/2018  . Hypertension   . Myalgia 12/31/2011  . Neuropathy 12/31/2011  . Shortness of breath    "when she doesn't go to dialysis"  . Type II diabetes mellitus ([facility])     Family History  Problem Relation Age of Onset  . Diabetes Mother   . Hyperlipidemia Mother   . Hypertension Mother   . Hypertension Father   . Diabetes Sister   . Diabetes Brother   . Hypertension Brother   . Heart attack Brother   . Kidney disease Brother   . Colon cancer Neg Hx   . Colon polyps Neg Hx   . Esophageal cancer Neg Hx   . Gallbladder disease Neg Hx   . Rectal cancer Neg Hx   . Stomach cancer Neg Hx     Past Surgical History:  Procedure Laterality Date  . A/V FISTULAGRAM N/A 09/21/2018   Procedure: A/V FISTULAGRAM - Right Upper;  Surgeon: [name], MD;  Location: [facility];  Service: Cardiovascular;  Laterality: N/A;  . ABDOMINAL HYSTERECTOMY     partial  . AV FISTULA PLACEMENT Right 06/06/2013   Procedure: ARTERIOVENOUS (AV) FISTULA CREATION-RIGHT BRACHIAL CEPHALIC;  Surgeon: Conrad Blanford, MD;  Location: [facility];  Service: Vascular;  Laterality: Right;  . BREAST BIOPSY Left   . BREAST LUMPECTOMY Left    "and took out some lymph nodes" (06/15/2013)  . CARPAL TUNNEL RELEASE Left 11/26/2017   Procedure: LEFT CARPAL TUNNEL RELEASE;  Surgeon: [name], MD;  Location: [facility];  Service: Orthopedics;  Laterality: Left;  . CATARACT EXTRACTION W/ ANTERIOR VITRECTOMY Bilateral   . CESAREAN SECTION  1980  . COLONOSCOPY    . ESOPHAGOGASTRODUODENOSCOPY N/A 07/08/2017   Procedure: ESOPHAGOGASTRODUODENOSCOPY (EGD);  Surgeon: [name], MD;  [facility];  Service: Endoscopy;  Laterality: N/A;  .  ESOPHAGOGASTRODUODENOSCOPY (EGD) WITH PROPOFOL N/A 02/07/2018   Procedure: ESOPHAGOGASTRODUODENOSCOPY (EGD) WITH PROPOFOL;  Surgeon: [name], MD;  Location: [facility];  Service: Gastroenterology;  Laterality: N/A;  APC and clips placed  . EYE SURGERY Bilateral    laser surgery  . PARS PLANA VITRECTOMY Right 05/05/2017   Procedure: PARS PLANA VITRECTOMY WITH 25 GAUGE; PARTIAL REMOVAL OF OIL; INFERIOR PERIPHERAL IRIDECTOMY, REFORM ANTERIOR CHAMBER RIGHT EYE;  Surgeon: [name], MD;  Location: [facility];  Service: Ophthalmology;  Laterality: Right;  . PERIPHERAL VASCULAR BALLOON ANGIOPLASTY Right 09/21/2018   Procedure: PERIPHERAL VASCULAR BALLOON ANGIOPLASTY;  Surgeon: [name], MD;  Location: [facility];  Service: Cardiovascular;  Laterality: Right;  AV fistula  . REFRACTIVE SURGERY  Bilateral   . REMOVAL OF A DIALYSIS CATHETER Right 06/06/2013   Procedure: REMOVAL OF RIGHT MEDIPORT;  Surgeon: Conrad [place], MD;  Location: [facility];  Service: Vascular;  Laterality: Right;  . TONSILLECTOMY    . UPPER GASTROINTESTINAL ENDOSCOPY     Social History   Occupational History  . Occupation: Diabled  Tobacco Use  . Smoking status: Never Smoker  . Smokeless tobacco: Never Used  Substance and Sexual Activity  . Alcohol use: No  . Drug use: No  . Sexual activity: Never

## 2018-11-26 NOTE — Progress Notes (Signed)
Depo Medrol 40 mg (1cc) + Decadron 4 mg (1cc) given IM left dorsolateral gluteal region, per Dr. Junius Roads.

## 2018-11-29 ENCOUNTER — Other Ambulatory Visit (HOSPITAL_COMMUNITY)
Admission: RE | Admit: 2018-11-29 | Discharge: 2018-11-29 | Disposition: A | Discharge status: Home / Self Care | Payer: Medicare Other | Source: Ambulatory Visit | Attending: Vascular Surgery | Admitting: Vascular Surgery

## 2018-11-29 ENCOUNTER — Telehealth: Payer: Self-pay

## 2018-11-29 ENCOUNTER — Other Ambulatory Visit: Payer: Self-pay | Admitting: Family Medicine

## 2018-11-29 ENCOUNTER — Inpatient Hospital Stay (HOSPITAL_COMMUNITY): Admission: RE | Admit: 2018-11-29 | Payer: Medicare Other | Source: Ambulatory Visit

## 2018-11-29 ENCOUNTER — Other Ambulatory Visit: Payer: Self-pay

## 2018-11-29 DIAGNOSIS — M5442 Lumbago with sciatica, left side: Secondary | ICD-10-CM

## 2018-11-29 DIAGNOSIS — Z1159 Encounter for screening for other viral diseases: Secondary | ICD-10-CM | POA: Diagnosis not present

## 2018-11-29 LAB — SARS CORONAVIRUS 2 BY RT PCR (HOSPITAL ORDER, PERFORMED IN ~~LOC~~ HOSPITAL LAB): SARS Coronavirus 2: NEGATIVE

## 2018-11-29 NOTE — Telephone Encounter (Signed)
[name] called stating that injection and Aspirin didn't help with the pain and would like something for pain called into her pharmacy?  I did advise [name] that MRI order was submitted for patient.  CB# is [phone].  Please advise.  Thank you.

## 2018-11-29 NOTE — Telephone Encounter (Signed)
Unfortunately there's not much I can offer for pain.  Narcotics cause her hallucinations.  Can't do anti-inflammatories due to kidneys.  Can't do prednisone pills due to diabetes.  Could try taking extra doses of her gabapentin (300 mg three times daily) to see if it helps.

## 2018-11-29 NOTE — Addendum Note (Signed)
Addended by: Marlyne Beards on: 11/29/2018 10:19 AM   Modules accepted: Orders

## 2018-11-29 NOTE — Telephone Encounter (Signed)
I called and spoke with [name]. I gave her Dr. Junius Roads' suggestion on the Gabapentin - she may try that. Will await the MRI.

## 2018-11-29 NOTE — Telephone Encounter (Signed)
Please advise 

## 2018-11-29 NOTE — Telephone Encounter (Signed)
Sister [name] called to let us know that she was taking her to the ER for an issue with her bones. Wanted [place] to know because she is having surgery tomorrow.    Called and her sister said that she was having some sciatic pain and just wanted to see if they could give her something at the hospital for pain but they wouldn't.   They said that she will keep her surgery appt for tomorrow.   York Cerise, CMA

## 2018-11-30 ENCOUNTER — Telehealth: Payer: Self-pay | Admitting: *Deleted

## 2018-11-30 ENCOUNTER — Ambulatory Visit (HOSPITAL_COMMUNITY)
Admission: RE | Admit: 2018-11-30 | Discharge: 2018-11-30 | Disposition: A | Discharge status: Home / Self Care | Payer: Medicare Other | Attending: Vascular Surgery | Admitting: Vascular Surgery

## 2018-11-30 ENCOUNTER — Encounter (HOSPITAL_COMMUNITY)
Admission: RE | Disposition: A | Discharge status: Home / Self Care | Payer: Self-pay | Source: Home / Self Care | Attending: Vascular Surgery

## 2018-11-30 ENCOUNTER — Other Ambulatory Visit: Payer: Self-pay

## 2018-11-30 DIAGNOSIS — Z7982 Long term (current) use of aspirin: Secondary | ICD-10-CM | POA: Insufficient documentation

## 2018-11-30 DIAGNOSIS — Z79899 Other long term (current) drug therapy: Secondary | ICD-10-CM | POA: Diagnosis not present

## 2018-11-30 DIAGNOSIS — Z888 Allergy status to other drugs, medicaments and biological substances status: Secondary | ICD-10-CM | POA: Diagnosis not present

## 2018-11-30 DIAGNOSIS — Z91040 Latex allergy status: Secondary | ICD-10-CM | POA: Insufficient documentation

## 2018-11-30 DIAGNOSIS — M1990 Unspecified osteoarthritis, unspecified site: Secondary | ICD-10-CM | POA: Insufficient documentation

## 2018-11-30 DIAGNOSIS — Z885 Allergy status to narcotic agent status: Secondary | ICD-10-CM | POA: Diagnosis not present

## 2018-11-30 DIAGNOSIS — Z992 Dependence on renal dialysis: Secondary | ICD-10-CM | POA: Insufficient documentation

## 2018-11-30 DIAGNOSIS — I70292 Other atherosclerosis of native arteries of extremities, left leg: Secondary | ICD-10-CM | POA: Diagnosis not present

## 2018-11-30 DIAGNOSIS — Z794 Long term (current) use of insulin: Secondary | ICD-10-CM | POA: Diagnosis not present

## 2018-11-30 DIAGNOSIS — N186 End stage renal disease: Secondary | ICD-10-CM | POA: Diagnosis not present

## 2018-11-30 DIAGNOSIS — E1140 Type 2 diabetes mellitus with diabetic neuropathy, unspecified: Secondary | ICD-10-CM | POA: Insufficient documentation

## 2018-11-30 DIAGNOSIS — K219 Gastro-esophageal reflux disease without esophagitis: Secondary | ICD-10-CM | POA: Diagnosis not present

## 2018-11-30 DIAGNOSIS — M869 Osteomyelitis, unspecified: Secondary | ICD-10-CM | POA: Diagnosis not present

## 2018-11-30 DIAGNOSIS — J45909 Unspecified asthma, uncomplicated: Secondary | ICD-10-CM | POA: Insufficient documentation

## 2018-11-30 DIAGNOSIS — H4089 Other specified glaucoma: Secondary | ICD-10-CM | POA: Diagnosis not present

## 2018-11-30 DIAGNOSIS — M86072 Acute hematogenous osteomyelitis, left ankle and foot: Secondary | ICD-10-CM | POA: Diagnosis not present

## 2018-11-30 DIAGNOSIS — Z8249 Family history of ischemic heart disease and other diseases of the circulatory system: Secondary | ICD-10-CM | POA: Diagnosis not present

## 2018-11-30 DIAGNOSIS — I509 Heart failure, unspecified: Secondary | ICD-10-CM | POA: Diagnosis not present

## 2018-11-30 DIAGNOSIS — E1122 Type 2 diabetes mellitus with diabetic chronic kidney disease: Secondary | ICD-10-CM | POA: Insufficient documentation

## 2018-11-30 DIAGNOSIS — I132 Hypertensive heart and chronic kidney disease with heart failure and with stage 5 chronic kidney disease, or end stage renal disease: Secondary | ICD-10-CM | POA: Diagnosis not present

## 2018-11-30 DIAGNOSIS — E1169 Type 2 diabetes mellitus with other specified complication: Secondary | ICD-10-CM | POA: Insufficient documentation

## 2018-11-30 HISTORY — PX: PERIPHERAL VASCULAR BALLOON ANGIOPLASTY: CATH118281

## 2018-11-30 HISTORY — PX: ABDOMINAL AORTOGRAM: CATH118222

## 2018-11-30 HISTORY — PX: LOWER EXTREMITY ANGIOGRAPHY: CATH118251

## 2018-11-30 LAB — GLUCOSE, CAPILLARY
Glucose-Capillary: 135 mg/dL — ABNORMAL HIGH (ref 70–99)
Glucose-Capillary: 117 mg/dL — ABNORMAL HIGH (ref 70–99)

## 2018-11-30 LAB — POCT I-STAT 4, (NA,K, GLUC, HGB,HCT)
Glucose, Bld: 138 mg/dL — ABNORMAL HIGH (ref 70–99)
HCT: 29 % — ABNORMAL LOW (ref 36.0–46.0)
Hemoglobin: 9.9 g/dL — ABNORMAL LOW (ref 12.0–15.0)
Potassium: 4.2 mmol/L (ref 3.5–5.1)
Sodium: 137 mmol/L (ref 135–145)

## 2018-11-30 LAB — POCT ACTIVATED CLOTTING TIME
Activated Clotting Time: 246 seconds
Activated Clotting Time: 235 seconds
Activated Clotting Time: 219 seconds
Activated Clotting Time: 197 seconds
Activated Clotting Time: 180 seconds

## 2018-11-30 SURGERY — LOWER EXTREMITY ANGIOGRAPHY
Anesthesia: LOCAL

## 2018-11-30 MED ORDER — LABETALOL HCL 5 MG/ML IV SOLN: 10.0000 mg | INTRAVENOUS | Status: DC | PRN

## 2018-11-30 MED ORDER — FENTANYL CITRATE (PF) 100 MCG/2ML IJ SOLN
INTRAMUSCULAR | Status: DC | PRN
  Administered 2018-11-30: 25 ug via INTRAVENOUS

## 2018-11-30 MED ORDER — SODIUM CHLORIDE 0.9% FLUSH: 3.0000 mL | Freq: Two times a day (BID) | INTRAVENOUS | Status: DC

## 2018-11-30 MED ORDER — HEPARIN SODIUM (PORCINE) 1000 UNIT/ML IJ SOLN
INTRAMUSCULAR | Status: AC
  Filled 2018-11-30: qty 1

## 2018-11-30 MED ORDER — SODIUM CHLORIDE 0.9% FLUSH: 3.0000 mL | INTRAVENOUS | Status: DC | PRN

## 2018-11-30 MED ORDER — ONDANSETRON HCL 4 MG/2ML IJ SOLN: 4.0000 mg | Freq: Four times a day (QID) | INTRAMUSCULAR | Status: DC | PRN

## 2018-11-30 MED ORDER — HEPARIN (PORCINE) IN NACL 1000-0.9 UT/500ML-% IV SOLN
INTRAVENOUS | Status: AC
  Filled 2018-11-30: qty 1000

## 2018-11-30 MED ORDER — HEPARIN SODIUM (PORCINE) 1000 UNIT/ML IJ SOLN
INTRAMUSCULAR | Status: DC | PRN
  Administered 2018-11-30: 9000 [IU] via INTRAVENOUS
  Administered 2018-11-30: 2000 [IU] via INTRAVENOUS

## 2018-11-30 MED ORDER — IODIXANOL 320 MG/ML IV SOLN
INTRAVENOUS | Status: DC | PRN
  Administered 2018-11-30: 175 mL via INTRA_ARTERIAL

## 2018-11-30 MED ORDER — LIDOCAINE HCL (PF) 1 % IJ SOLN
INTRAMUSCULAR | Status: AC
  Filled 2018-11-30: qty 30

## 2018-11-30 MED ORDER — ACETAMINOPHEN 325 MG PO TABS: 650.0000 mg | ORAL_TABLET | ORAL | Status: DC | PRN

## 2018-11-30 MED ORDER — MORPHINE SULFATE (PF) 10 MG/ML IV SOLN: 2.0000 mg | INTRAVENOUS | Status: DC | PRN

## 2018-11-30 MED ORDER — CLOPIDOGREL BISULFATE 75 MG PO TABS
300.0000 mg | ORAL_TABLET | Freq: Once | ORAL | Status: AC
  Administered 2018-11-30: 300 mg via ORAL

## 2018-11-30 MED ORDER — FENTANYL CITRATE (PF) 100 MCG/2ML IJ SOLN
INTRAMUSCULAR | Status: AC
  Filled 2018-11-30: qty 2

## 2018-11-30 MED ORDER — OXYCODONE HCL 5 MG PO TABS: 5.0000 mg | ORAL_TABLET | ORAL | Status: DC | PRN

## 2018-11-30 MED ORDER — HYDRALAZINE HCL 20 MG/ML IJ SOLN: 5.0000 mg | INTRAMUSCULAR | Status: DC | PRN

## 2018-11-30 MED ORDER — LIDOCAINE HCL (PF) 1 % IJ SOLN
INTRAMUSCULAR | Status: DC | PRN
  Administered 2018-11-30: 10 mL

## 2018-11-30 MED ORDER — HEPARIN (PORCINE) IN NACL 1000-0.9 UT/500ML-% IV SOLN
INTRAVENOUS | Status: DC | PRN
  Administered 2018-11-30 (×2): 500 mL

## 2018-11-30 MED ORDER — CLOPIDOGREL BISULFATE 75 MG PO TABS: 75.0000 mg | ORAL_TABLET | Freq: Every day | ORAL | Status: DC

## 2018-11-30 MED ORDER — NITROGLYCERIN 1 MG/10 ML FOR IR/CATH LAB
INTRA_ARTERIAL | Status: DC | PRN
  Administered 2018-11-30 (×3): 300 ug via INTRA_ARTERIAL

## 2018-11-30 MED ORDER — NITROGLYCERIN 1 MG/10 ML FOR IR/CATH LAB
INTRA_ARTERIAL | Status: AC
  Filled 2018-11-30: qty 10

## 2018-11-30 MED ORDER — CLOPIDOGREL BISULFATE 75 MG PO TABS: 75.0000 mg | ORAL_TABLET | Freq: Every day | ORAL | 11 refills | Status: DC

## 2018-11-30 MED ORDER — SODIUM CHLORIDE 0.9 % IV SOLN: 250.0000 mL | INTRAVENOUS | Status: DC | PRN

## 2018-11-30 MED ORDER — CLOPIDOGREL BISULFATE 300 MG PO TABS
ORAL_TABLET | ORAL | Status: AC
  Filled 2018-11-30: qty 1

## 2018-11-30 SURGICAL SUPPLY — 20 items
BALL STERLING OTW 2.5X150X150 (BALLOONS) ×1
BALLN STERLING OTW 2.5X150X150 (BALLOONS) ×3
BALLN STERLING OTW 3X150X150 (BALLOONS) ×4
BALLOON STERLING OTW 3X150X150 (BALLOONS) IMPLANT
BALLOON STRLNG OTW 2.5X150X150 (BALLOONS) ×1 IMPLANT
CATH ANGIO 5F PIGTAIL 65CM (CATHETERS) ×3 IMPLANT
CATH CROSS OVER TEMPO 5F (CATHETERS) ×1 IMPLANT
CATH QUICKCROSS ANG SELECT (CATHETERS) ×2 IMPLANT
CATH STRAIGHT 5FR 65CM (CATHETERS) ×4 IMPLANT
KIT ENCORE 26 ADVANTAGE (KITS) ×2 IMPLANT
KIT PV (KITS) ×4 IMPLANT
SHEATH PINNACLE 5F 10CM (SHEATH) ×2 IMPLANT
SHEATH PINNACLE 6F 10CM (SHEATH) ×1 IMPLANT
SHEATH PINNACLE ST 6F 65CM (SHEATH) ×2 IMPLANT
SHEATH PROBE COVER 6X72 (BAG) ×2 IMPLANT
SYR MEDRAD MARK V 150ML (SYRINGE) ×2 IMPLANT
TRANSDUCER W/STOPCOCK (MISCELLANEOUS) ×4 IMPLANT
TRAY PV CATH (CUSTOM PROCEDURE TRAY) ×4 IMPLANT
WIRE G V18X300CM (WIRE) ×1 IMPLANT
WIRE HITORQ VERSACORE ST 145CM (WIRE) ×1 IMPLANT

## 2018-11-30 NOTE — Progress Notes (Signed)
Discharge instructions reviewed with pt sister. Voices understanding. Instructed to pick up Plavix today and to start taking tomorrow. (she has taken today's dose) Instructed pt of this too voices understanding.

## 2018-11-30 NOTE — Progress Notes (Signed)
Site area: Right femoral Site prior to removal: level 0 Pressure applied for: 20 mins Manual: yes Patient status during pull: stable Post pull site: level 0 Post pull instructions given: yes Post pull pulses present:yes Dressing applied: gauze and tegaderm Bedrest begins: 1210

## 2018-11-30 NOTE — Telephone Encounter (Signed)
-----   Message from [name], MD sent at 11/30/2018  9:31 AM EDT ----- Procedure: Abdominal aortogram with bilateral lower extremity runoff, angioplasty left anterior tibial artery   We will schedule her for a follow-up visit in 4 to 6 weeks to recheck the foot.  She will have a left leg duplex ultrasound and ABIs at that office visit  She will be maintained on Plavix and aspirin indefinitely.  Plavix prescription was started today with a loading dose here at [facility] and a prescription sent to her pharmacy with 30 dispensed and 11 refills.  Ruta Hinds, MD [facility] Office: [phone] Pager: [phone]

## 2018-11-30 NOTE — Interval H&P Note (Signed)
History and Physical Interval Note:  11/30/2018 7:34 AM  [name]  has presented today for surgery, with the diagnosis of pvd.  The various methods of treatment have been discussed with the patient and family. After consideration of risks, benefits and other options for treatment, the patient has consented to  Procedure(s): LOWER EXTREMITY ANGIOGRAPHY (N/A) as a surgical intervention.  The patient's history has been reviewed, patient examined, no change in status, stable for surgery.  I have reviewed the patient's chart and labs.  Questions were answered to the patient's satisfaction.     Ruta Hinds

## 2018-11-30 NOTE — Discharge Instructions (Signed)
Angiogram, Care After °This sheet gives you information about how to care for yourself after your procedure. Your health care provider may also give you more specific instructions. If you have problems or questions, contact your health care provider. °What can I expect after the procedure? °After the procedure, it is common to have bruising and tenderness at the catheter insertion area. °Follow these instructions at home: °Insertion site care °· Follow instructions from your health care provider about how to take care of your insertion site. Make sure you: °? Wash your hands with soap and water before you change your bandage (dressing). If soap and water are not available, use hand sanitizer. °? Change your dressing as told by your health care provider. °? Leave stitches (sutures), skin glue, or adhesive strips in place. These skin closures may need to stay in place for 2 weeks or longer. If adhesive strip edges start to loosen and curl up, you may trim the loose edges. Do not remove adhesive strips completely unless your health care provider tells you to do that. °· Do not take baths, swim, or use a hot tub until your health care provider approves. °· You may shower 24-48 hours after the procedure or as told by your health care provider. °? Gently wash the site with plain soap and water. °? Pat the area dry with a clean towel. °? Do not rub the site. This may cause bleeding. °· Do not apply powder or lotion to the site. Keep the site clean and dry. °· Check your insertion site every day for signs of infection. Check for: °? Redness, swelling, or pain. °? Fluid or blood. °? Warmth. °? Pus or a bad smell. °Activity °· Rest as told by your health care provider, usually for 1-2 days. °· Do not lift anything that is heavier than 10 lbs. (4.5 kg) or as told by your health care provider. °· Do not drive for 24 hours if you were given a medicine to help you relax (sedative). °· Do not drive or use heavy machinery while  taking prescription pain medicine. °General instructions ° °· Return to your normal activities as told by your health care provider, usually in about a week. Ask your health care provider what activities are safe for you. °· If the catheter site starts bleeding, lie flat and put pressure on the site. If the bleeding does not stop, get help right away. This is a medical emergency. °· Drink enough fluid to keep your urine clear or pale yellow. This helps flush the contrast dye from your body. °· Take over-the-counter and prescription medicines only as told by your health care provider. °· Keep all follow-up visits as told by your health care provider. This is important. °Contact a health care provider if: °· You have a fever or chills. °· You have redness, swelling, or pain around your insertion site. °· You have fluid or blood coming from your insertion site. °· The insertion site feels warm to the touch. °· You have pus or a bad smell coming from your insertion site. °· You have bruising around the insertion site. °· You notice blood collecting in the tissue around the catheter site (hematoma). The hematoma may be painful to the touch. °Get help right away if: °· You have severe pain at the catheter insertion area. °· The catheter insertion area swells very fast. °· The catheter insertion area is bleeding, and the bleeding does not stop when you hold steady pressure on the area. °·   The area near or just beyond the catheter insertion site becomes pale, cool, tingly, or numb. These symptoms may represent a serious problem that is an emergency. Do not wait to see if the symptoms will go away. Get medical help right away. Call your local emergency services (911 in the [place] Do not drive yourself to the hospital. Summary  After the procedure, it is common to have bruising and tenderness at the catheter insertion area.  After the procedure, it is important to rest and drink plenty of fluids.  Do not take baths,  swim, or use a hot tub until your health care provider says it is okay to do so. You may shower 24-48 hours after the procedure or as told by your health care provider.  If the catheter site starts bleeding, lie flat and put pressure on the site. If the bleeding does not stop, get help right away. This is a medical emergency. This information is not intended to replace advice given to you by your health care provider. Make sure you discuss any questions you have with your health care provider. Document Released: 01/23/2005 Document Revised: 06/11/2016 Document Reviewed: 06/11/2016 Elsevier Interactive Patient Education  2019 Elsevier Inc.  Clopidogrel tablets What is this medicine? CLOPIDOGREL (kloh PID oh grel) helps to prevent blood clots. This medicine is used to prevent heart attack, stroke, or other vascular events in people who are at high risk. This medicine may be used for other purposes; ask your health care provider or pharmacist if you have questions. COMMON BRAND NAME(S): Plavix What should I tell my health care provider before I take this medicine? They need to know if you have any of the following conditions: -bleeding disorders -bleeding in the brain -having surgery -history of stomach bleeding -an unusual or allergic reaction to clopidogrel, other medicines, foods, dyes, or preservatives -pregnant or trying to get pregnant -breast-feeding How should I use this medicine? Take this medicine by mouth with a glass of water. Follow the directions on the prescription label. You may take this medicine with or without food. If it upsets your stomach, take it with food. Take your medicine at regular intervals. Do not take it more often than directed. Do not stop taking except on your doctor's advice. A special MedGuide will be given to you by the pharmacist with each prescription and refill. Be sure to read this information carefully each time. Talk to your pediatrician regarding the  use of this medicine in children. Special care may be needed. Overdosage: If you think you have taken too much of this medicine contact a poison control center or emergency room at once. NOTE: This medicine is only for you. Do not share this medicine with others. What if I miss a dose? If you miss a dose, take it as soon as you can. If it is almost time for your next dose, take only that dose. Do not take double or extra doses. What may interact with this medicine? Do not take this medicine with the following medications: -dasabuvir; ombitasvir; paritaprevir; ritonavir -defibrotide -selexipag This medicine may also interact with the following medications: -certain medicines that treat or prevent blood clots like warfarin -narcotic medicines for pain -NSAIDs, medicines for pain and inflammation, like ibuprofen or naproxen -repaglinide -SNRIs, medicines for depression, like desvenlafaxine, duloxetine, levomilnacipran, venlafaxine -SSRIs, medicines for depression, like citalopram, escitalopram, fluoxetine, fluvoxamine, paroxetine, sertraline -stomach acid blockers like cimetidine, esomeprazole, omeprazole This list may not describe all possible interactions. Give your health care provider  a list of all the medicines, herbs, non-prescription drugs, or dietary supplements you use. Also tell them if you smoke, drink alcohol, or use illegal drugs. Some items may interact with your medicine. What should I watch for while using this medicine? Visit your doctor or health care professional for regular check-ups. Do not stop taking your medicine unless your doctor tells you to. Notify your doctor or health care professional and seek emergency treatment if you develop breathing problems; changes in vision; chest pain; severe, sudden headache; pain, swelling, warmth in the leg; trouble speaking; sudden numbness or weakness of the face, arm or leg. These can be signs that your condition has gotten worse. If  you are going to have surgery or dental work, tell your doctor or health care professional that you are taking this medicine. Certain genetic factors may reduce the effect of this medicine. Your doctor may use genetic tests to determine treatment. Only take aspirin if you are instructed to. Low doses of aspirin are used with this medicine to treat some conditions. Taking aspirin with this medicine can increase your risk of bleeding so you must be careful. Talk to your doctor or pharmacist if you have questions. What side effects may I notice from receiving this medicine? Side effects that you should report to your doctor or health care professional as soon as possible: -allergic reactions like skin rash, itching or hives, swelling of the face, lips, or tongue -signs and symptoms of bleeding such as bloody or black, tarry stools; red or dark-brown urine; spitting up blood or brown material that looks like coffee grounds; red spots on the skin; unusual bruising or bleeding from the eye, gums, or nose -signs and symptoms of a blood clot such as breathing problems; changes in vision; chest pain; severe, sudden headache; pain, swelling, warmth in the leg; trouble speaking; sudden numbness or weakness of the face, arm or leg -signs and symptoms of low blood sugar such as feeling anxious; confusion; dizziness; increased hunger; unusually weak or tired; increased sweating; shakiness; cold, clammy skin; irritable; headache; blurred vision; fast heartbeat; loss of consciousness Side effects that usually do not require medical attention (report to your doctor or health care professional if they continue or are bothersome): -constipation -diarrhea -headache -upset stomach This list may not describe all possible side effects. Call your doctor for medical advice about side effects. You may report side effects to FDA at 1-800-FDA-1088. Where should I keep my medicine? Keep out of the reach of children. Store at  room temperature of 59 to 86 degrees F (15 to 30 degrees C). Throw away any unused medicine after the expiration date. NOTE: This sheet is a summary. It may not cover all possible information. If you have questions about this medicine, talk to your doctor, pharmacist, or health care provider.  2019 Elsevier/Gold Standard (2017-12-07 15:03:38)

## 2018-11-30 NOTE — Op Note (Signed)
Procedure: Abdominal aortogram with bilateral lower extremity runoff, angioplasty left anterior tibial artery  Preoperative diagnosis: Osteomyelitis left first toe  Postoperative diagnosis: Same  Anesthesia: Local with IV sedation  Operative findings:  1.  Right leg inline flow anterior tibial artery runoff  2.  No significant aortoiliac or popliteal occlusive disease  3.  Multiple segments subtotal occlusion left anterior tibial artery angioplastied to less than 25% residual stenosis, one-vessel runoff left foot anterior tibial artery  Operative details: After team informed consent, the patient taken the [facility] lab.  The patient was placed in supine position Angie table.  Both groins were prepped and draped in usual sterile fashion.  Ultrasound was used to identify the right common femoral artery and femoral bifurcation.  Image was obtained to the patient's chart.  These were patent.  Local anesthesia was infiltrated over the right common femoral artery and an introducer needle used to cannulate this and an 035 versa core wire threaded in the abdominal aorta under fluoroscopic guidance.  Next a 5 French sheath was placed over the guidewire in the right common femoral artery.  This was thoroughly flushed with overnight saline.  5 French pigtail catheter was advanced over the guidewire and abdominal aortogram obtained AP projection.  The left and right renal arteries are patent.  The infrarenal abdominal aorta is patent.  The left and right common external and internal iliac arteries are all patent.  At this point the pigtail catheter was pulled down just above the aortic bifurcation and bilateral lower extremity runoff views were obtained through the pigtail catheter.  In the right lower extremity, the right common femoral profundofemoral superficial femoral popliteal arteries are all widely patent.  They are all heavily calcified.  The anterior tibial artery is patent all the way to the level of the  foot as the dorsalis pedis artery.  There are multiple segments of heavy calcification.  The peroneal and posterior tibial arteries are occluded.  In the left lower extremity, the left common femoral profunda femoris superficial femoral and popliteal arteries are all widely patent.  The left anterior tibial artery is the dominant runoff vessel to the left foot and has multiple segments of subtotal occlusion extending from the middle third and proximal third of the anterior tibial artery all the way down to the level of the ankle.  The dorsalis pedis is patent.  The posterior tibial and peroneal arteries are occluded.  There are some collateral filling of the peroneal artery distally.  At this point is decided to intervene on the patient's left anterior tibial artery.  The pigtail catheter was exchanged over guidewire for a 5 [place] crossover catheter.  The versa core wire was advanced up and over the aortic bifurcation all the way into the left superficial femoral artery.  The 5 French sheath was removed and exchanged for a 6 [place] destination sheath.  It was advanced to the middle portion of the left superficial femoral artery.  A quick cross select catheter then advanced over a V 18 wire and the V 18 wire advanced across the lesion all the way to the level of the ankle.  There was initially some resistance at the level of the ankle.  The patient was given a total of 11,000 units of heparin to maintain an ACT greater than 250.  A 3 x 200 angioplasty balloon was advanced all the way down to the level of the ankle through the anterior tibial artery over the V 18 wire.  This was  then inflated to 6 atm for a minute.  Multiple overlapping inflations were performed.  At this point I was able to advance the guidewire into the dorsalis pedis artery.  However the guidewire would still not give enough support to advance the balloon all the way to the level of the ankle.  I then swapped out for a 2.5 x 200 angioplasty  balloon and this was used to make multiple overlapping inflations from the ankle all the way to the level of the origin of the anterior tibial artery.  Completion angiogram showed some residual spasm in the dorsalis pedis artery but the vessel was patent and the anterior tibial artery was significantly improved with less than 25% stenosis throughout the course of the artery.  There was no evidence of dissection or rupture.  Patient case was given several doses of 300 mg of nitroglycerin during the procedure to prevent arterial spasm.  At this point the guidewire and catheter were removed the sheath was pulled back down across the aortic bifurcation and swapped out for a short 6 [place] sheath.  This was thoroughly flushed heparinized saline.  Sheath was left in place to be pulled in the holding area.  The patient tolerated procedure well and there were no complications.  Operative management: The patient now has inline flow to the left foot via the anterior tibial artery.  Hopefully this will be adequate perfusion to heal her left foot.  We will schedule her for a follow-up visit in 4 to 6 weeks to recheck the foot.  She will have a duplex ultrasound and ABIs at that office visit.  She will be maintained on Plavix and aspirin indefinitely.  Plavix prescription was started today with a loading dose here at [facility] and a prescription sent to her pharmacy with 30 dispensed and 11 refills.  Ruta Hinds, MD [facility] Office: [phone] Pager: [phone]

## 2018-11-30 NOTE — Telephone Encounter (Signed)
Documentation of future care plan.

## 2018-12-01 ENCOUNTER — Encounter (HOSPITAL_COMMUNITY): Payer: Self-pay | Admitting: Vascular Surgery

## 2018-12-01 DIAGNOSIS — N186 End stage renal disease: Secondary | ICD-10-CM | POA: Diagnosis not present

## 2018-12-01 DIAGNOSIS — D631 Anemia in chronic kidney disease: Secondary | ICD-10-CM | POA: Diagnosis not present

## 2018-12-01 DIAGNOSIS — N2581 Secondary hyperparathyroidism of renal origin: Secondary | ICD-10-CM | POA: Diagnosis not present

## 2018-12-01 DIAGNOSIS — E1129 Type 2 diabetes mellitus with other diabetic kidney complication: Secondary | ICD-10-CM | POA: Diagnosis not present

## 2018-12-01 DIAGNOSIS — M869 Osteomyelitis, unspecified: Secondary | ICD-10-CM | POA: Diagnosis not present

## 2018-12-01 DIAGNOSIS — Z992 Dependence on renal dialysis: Secondary | ICD-10-CM | POA: Diagnosis not present

## 2018-12-02 ENCOUNTER — Ambulatory Visit (INDEPENDENT_AMBULATORY_CARE_PROVIDER_SITE_OTHER): Payer: Medicare Other | Admitting: Podiatry

## 2018-12-02 ENCOUNTER — Other Ambulatory Visit: Payer: Self-pay

## 2018-12-02 VITALS — Temp 97.3°F

## 2018-12-02 DIAGNOSIS — M869 Osteomyelitis, unspecified: Secondary | ICD-10-CM

## 2018-12-02 DIAGNOSIS — I739 Peripheral vascular disease, unspecified: Secondary | ICD-10-CM | POA: Diagnosis not present

## 2018-12-02 DIAGNOSIS — I96 Gangrene, not elsewhere classified: Secondary | ICD-10-CM | POA: Diagnosis not present

## 2018-12-02 NOTE — Progress Notes (Signed)
Subjective:  Patient ID: [name], female    DOB: Jan 16, 1951,  MRN: [id number]  Chief Complaint  Patient presents with  . Nail Problem    Left foot 1st toenail is still painful, pt denies fever/nausea/vomiting/chills.    68 y.o. female presents with the above complaint.  Was being seen by Dr. Adah Perl who noted a wound about the left great toe with signs of osteomyelitis.  Patient known with peripheral arterial disease left lower extremity was referred for evaluation and now pending angiogram with Dr. Oneida Alar next week.  Receiving antibiotics with dialysis   Review of Systems: Negative except as noted in the HPI. Denies N/V/F/Ch.  Past Medical History:  Diagnosis Date  . Anemia   . Anxiety   . Arthritis    "joints" (06/15/2013)  . Asthma   . 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 Blood transfusion without reported diagnosis   . Breast cancer ([place])    left  . CHF (congestive heart failure) ([place])   . 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 Esophageal ulcer with bleeding   . ESRD (end stage renal disease) ([place])    "suppose to start dialysis today" (06/15/2013)  . GERD (gastroesophageal reflux disease)   . Glaucoma    blind in both eyes  . Heart murmur   . Hx of adenomatous colonic polyps 04/07/2018  . Hypertension   . Myalgia 12/31/2011  . Neuropathy 12/31/2011  . Shortness of breath    "when she doesn't go to dialysis"  . Type II diabetes mellitus (HCC)     Current Outpatient Medications:  .  acetaminophen (TYLENOL) 500 MG tablet, Take 500 mg by mouth 2 (two) times a day. , Disp: , Rfl:  .  albuterol (PROVENTIL) (2.5 MG/3ML) 0.083% nebulizer solution, Take 3 mLs (2.5 mg total) by nebulization every 6 (six) hours as needed for wheezing or shortness of breath., Disp: 75 mL, Rfl: 12 .  amLODipine (NORVASC) 5 MG tablet, Take 5 mg by mouth daily. , Disp: , Rfl:  .  amoxicillin-clavulanate (AUGMENTIN) 500-125 MG tablet, Take 1 tablet by mouth qd on  Tuesday, Thursday and Saturday., Disp: 10 tablet, Rfl: 0 .  aspirin 81 MG chewable tablet, Chew 81 mg by mouth daily. , Disp: , Rfl:  .  atropine 1 % ophthalmic solution, Place 1 drop into the right eye 2 (two) times daily., Disp: , Rfl: 0 .  B Complex-C-Folic Acid (RENA-VITE RX) 1 MG TABS, Take 1 tablet by mouth daily with lunch. , Disp: , Rfl: 1 .  carvedilol (COREG) 25 MG tablet, Take 25 mg by mouth 2 (two) times daily., Disp: , Rfl: 2 .  darbepoetin (ARANESP) 200 MCG/0.4ML SOLN injection, Inject 0.4 mLs (200 mcg total) into the vein every Wednesday with hemodialysis., Disp: 1.68 mL, Rfl:  .  docusate sodium (COLACE) 100 MG capsule, Take 100 mg by mouth daily., Disp: , Rfl:  .  doxercalciferol (HECTOROL) 4 MCG/2ML injection, Inject 0.5 mLs (1 mcg total) into the vein every Monday, Wednesday, and Friday with hemodialysis., Disp: 2 mL, Rfl:  .  gabapentin (NEURONTIN) 100 MG capsule, Take 100 mg by mouth 2 (two) times daily., Disp: , Rfl:  .  insulin aspart (NOVOLOG FLEXPEN) 100 UNIT/ML FlexPen, Inject 6-10 Units into the skin 3 (three) times daily with meals. per Sliding scale, Disp: , Rfl:  .  lanthanum (FOSRENOL) 1000 MG chewable tablet, Chew 2,000 mg by mouth 3 (three) times daily  with meals. , Disp: , Rfl:  .  lisinopril (PRINIVIL,ZESTRIL) 40 MG tablet, Take 40 mg by mouth at bedtime. , Disp: , Rfl:  .  omeprazole (PRILOSEC) 40 MG capsule, Take 1 capsule (40 mg total) by mouth daily. (Patient taking differently: Take 40 mg by mouth 2 (two) times daily. ), Disp: , Rfl:  .  ONETOUCH VERIO test strip, 3 (three) times daily. for testing, Disp: , Rfl: 9 .  prednisoLONE acetate (PRED FORTE) 1 % ophthalmic suspension, Place 1 drop into the right eye 2 (two) times daily. , Disp: , Rfl: 0 .  clopidogrel (PLAVIX) 75 MG tablet, Take 1 tablet (75 mg total) by mouth daily., Disp: 30 tablet, Rfl: 11 .  DULoxetine (CYMBALTA) 30 MG capsule, TK 1 C PO QD, Disp: , Rfl:   Current Facility-Administered  Medications:  .  0.9 %  sodium chloride infusion, 500 mL, Intravenous, Once, [name], MD .  dexamethasone (DECADRON) injection 4 mg, 4 mg, Intramuscular, Once, Hilts, , MD .  methylPREDNISolone acetate (DEPO-MEDROL) injection 40 mg, 40 mg, Intra-articular, Once, Hilts, , MD  Social History   Tobacco Use  Smoking Status Never Smoker  Smokeless Tobacco Never Used    Allergies  Allergen Reactions  . Latex Hives  . Oxycodone Other (See Comments)    Hallucinations   . Tramadol Other (See Comments)    hallucinations   . Tape Rash    34M Transpore adhesive tape. Medical tape  . Vicodin [Hydrocodone-Acetaminophen] Other (See Comments)    hallucinations   Objective:   Vitals:   11/25/18 1046  Temp: (!) 97.2 F (36.2 C)   There is no height or weight on file to calculate BMI. Constitutional Well developed. Well nourished.  Vascular Dorsalis pedis pulses palpable right nonpalpable left. Posterior tibial pulses right nonpalpable left .  Neurologic Normal speech. Oriented to person, place, and time. Epicritic sensation to light touch grossly absent bilaterally.  Dermatologic Nails well groomed and normal in appearance. Left hallux wound with probe to bone no acute purulent drainage no warmth only serous drainage noted gangrenous changes noted to the hallux decreased circulation noted to the left forefoot compared to the right  Orthopedic: Normal joint ROM without pain or crepitus bilaterally. No visible deformities. No bony tenderness.   Radiographs: Prior x-rays reviewed consisting of osteomyelitis of the distal medial aspect of the left hallux phalanx Assessment:   1. Abscess of left great toe   2. Osteomyelitis of great toe of left foot ([place])   3. Diabetic peripheral neuropathy associated with type 2 diabetes mellitus ([place])   4. Diabetic polyneuropathy associated with type 2 diabetes mellitus ([place])    Plan:  Patient was evaluated and treated and all  questions answered.  Osteomyelitis left hallux distal phalanx -Pending reperfusion with Dr. Oneida Alar -Continue antibiotics with dialysis -Dressed with Betadine today -We will monitor closely for signs of clinical improvement -No acute intervention needed today -Continue to keep the area dry  No follow-ups on file.

## 2018-12-03 ENCOUNTER — Telehealth: Payer: Self-pay | Admitting: Vascular Surgery

## 2018-12-03 DIAGNOSIS — N186 End stage renal disease: Secondary | ICD-10-CM | POA: Diagnosis not present

## 2018-12-03 DIAGNOSIS — D631 Anemia in chronic kidney disease: Secondary | ICD-10-CM | POA: Diagnosis not present

## 2018-12-03 DIAGNOSIS — E1129 Type 2 diabetes mellitus with other diabetic kidney complication: Secondary | ICD-10-CM | POA: Diagnosis not present

## 2018-12-03 DIAGNOSIS — Z992 Dependence on renal dialysis: Secondary | ICD-10-CM | POA: Diagnosis not present

## 2018-12-03 DIAGNOSIS — M869 Osteomyelitis, unspecified: Secondary | ICD-10-CM | POA: Diagnosis not present

## 2018-12-03 DIAGNOSIS — N2581 Secondary hyperparathyroidism of renal origin: Secondary | ICD-10-CM | POA: Diagnosis not present

## 2018-12-03 NOTE — Telephone Encounter (Signed)
-----   Message from [name], MD sent at 11/30/2018  9:31 AM EDT ----- Procedure: Abdominal aortogram with bilateral lower extremity runoff, angioplasty left anterior tibial artery   We will schedule her for a follow-up visit in 4 to 6 weeks to recheck the foot.  She will have a left leg duplex ultrasound and ABIs at that office visit  She will be maintained on Plavix and aspirin indefinitely.  Plavix prescription was started today with a loading dose here at [facility] and a prescription sent to her pharmacy with 30 dispensed and 11 refills.  Ruta Hinds, MD [facility] [place] Office: [phone] Pager: [phone]

## 2018-12-03 NOTE — Telephone Encounter (Signed)
sch appt lvm mld ltr 01/06/2019 10am ABI 11am LLE art 1120am wound check MD

## 2018-12-03 NOTE — Progress Notes (Signed)
Subjective:  Patient ID: [name], female    DOB: [birth year],  MRN: [id number]  Chief Complaint  Patient presents with  . Wound Check    Left 1st wound check. Pt denies fever/nausea/vomiting/chills.     68 y.o. female presents for wound care.  States that she is now status post reperfusion and per the doctor the circulation is much improved to her feet   Review of Systems: Negative except as noted in the HPI. Denies N/V/F/Ch.  Past Medical History:  Diagnosis Date  . Anemia   . Anxiety   . Arthritis    "joints" (06/15/2013)  . Asthma   . 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 Blood transfusion without reported diagnosis   . Breast cancer ([place])    left  . CHF (congestive heart failure) ([place])   . 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 Esophageal ulcer with bleeding   . ESRD (end stage renal disease) ([place])    "suppose to start dialysis today" (06/15/2013)  . GERD (gastroesophageal reflux disease)   . Glaucoma    blind in both eyes  . Heart murmur   . Hx of adenomatous colonic polyps 04/07/2018  . Hypertension   . Myalgia 12/31/2011  . Neuropathy 12/31/2011  . Shortness of breath    "when she doesn't go to dialysis"  . Type II diabetes mellitus (HCC)     Current Outpatient Medications:  .  acetaminophen (TYLENOL) 500 MG tablet, Take 500 mg by mouth 2 (two) times a day. , Disp: , Rfl:  .  albuterol (PROVENTIL) (2.5 MG/3ML) 0.083% nebulizer solution, Take 3 mLs (2.5 mg total) by nebulization every 6 (six) hours as needed for wheezing or shortness of breath., Disp: 75 mL, Rfl: 12 .  amLODipine (NORVASC) 5 MG tablet, Take 5 mg by mouth daily. , Disp: , Rfl:  .  amoxicillin-clavulanate (AUGMENTIN) 500-125 MG tablet, Take 1 tablet by mouth qd on Tuesday, Thursday and Saturday., Disp: 10 tablet, Rfl: 0 .  aspirin 81 MG chewable tablet, Chew 81 mg by mouth daily. , Disp: , Rfl:  .  atropine 1 % ophthalmic solution, Place 1 drop into the right eye 2  (two) times daily., Disp: , Rfl: 0 .  B Complex-C-Folic Acid (RENA-VITE RX) 1 MG TABS, Take 1 tablet by mouth daily with lunch. , Disp: , Rfl: 1 .  carvedilol (COREG) 25 MG tablet, Take 25 mg by mouth 2 (two) times daily., Disp: , Rfl: 2 .  clopidogrel (PLAVIX) 75 MG tablet, Take 1 tablet (75 mg total) by mouth daily., Disp: 30 tablet, Rfl: 11 .  darbepoetin (ARANESP) 200 MCG/0.4ML SOLN injection, Inject 0.4 mLs (200 mcg total) into the vein every Wednesday with hemodialysis., Disp: 1.68 mL, Rfl:  .  docusate sodium (COLACE) 100 MG capsule, Take 100 mg by mouth daily., Disp: , Rfl:  .  doxercalciferol (HECTOROL) 4 MCG/2ML injection, Inject 0.5 mLs (1 mcg total) into the vein every Monday, Wednesday, and Friday with hemodialysis., Disp: 2 mL, Rfl:  .  DULoxetine (CYMBALTA) 30 MG capsule, TK 1 C PO QD, Disp: , Rfl:  .  gabapentin (NEURONTIN) 100 MG capsule, Take 100 mg by mouth 2 (two) times daily., Disp: , Rfl:  .  insulin aspart (NOVOLOG FLEXPEN) 100 UNIT/ML FlexPen, Inject 6-10 Units into the skin 3 (three) times daily with meals. per Sliding scale, Disp: , Rfl:  .  lanthanum (FOSRENOL) 1000 MG chewable tablet, Chew 2,000  mg by mouth 3 (three) times daily with meals. , Disp: , Rfl:  .  lisinopril (PRINIVIL,ZESTRIL) 40 MG tablet, Take 40 mg by mouth at bedtime. , Disp: , Rfl:  .  omeprazole (PRILOSEC) 40 MG capsule, Take 1 capsule (40 mg total) by mouth daily. (Patient taking differently: Take 40 mg by mouth 2 (two) times daily. ), Disp: , Rfl:  .  ONETOUCH VERIO test strip, 3 (three) times daily. for testing, Disp: , Rfl: 9 .  prednisoLONE acetate (PRED FORTE) 1 % ophthalmic suspension, Place 1 drop into the right eye 2 (two) times daily. , Disp: , Rfl: 0  Current Facility-Administered Medications:  .  0.9 %  sodium chloride infusion, 500 mL, Intravenous, Once, [name], MD .  dexamethasone (DECADRON) injection 4 mg, 4 mg, Intramuscular, Once, Hilts, Michael, MD .  methylPREDNISolone  acetate (DEPO-MEDROL) injection 40 mg, 40 mg, Intra-articular, Once, Hilts, Michael, MD  Social History   Tobacco Use  Smoking Status Never Smoker  Smokeless Tobacco Never Used    Allergies  Allergen Reactions  . Latex Hives  . Oxycodone Other (See Comments)    Hallucinations   . Tramadol Other (See Comments)    hallucinations   . Tape Rash    74M Transpore adhesive tape. Medical tape  . Vicodin [Hydrocodone-Acetaminophen] Other (See Comments)    hallucinations   Objective:   Vitals:   12/02/18 1621  Temp: (!) 97.3 F (36.3 C)   There is no height or weight on file to calculate BMI. Constitutional Well developed. Well nourished.  Vascular Both feet warm to touch. Faintly palpable DP pulse bilat. No cyanosis or clubbing noted. Pedal hair growth absent.  Neurologic Normal speech. Oriented to person, place, and time. Protective sensation absent  Dermatologic L hallux wound dry, without warmth, erythema, odor, signs of infection. Surrounding skin warm.  Orthopedic: No pain to palpation either foot.   Radiographs: None Assessment:   1. Osteomyelitis of great toe of left foot (HCC)   2. Gangrene of toe of left foot ([place])   3. PAD (peripheral artery disease) ([place])    Plan:  Patient was evaluated and treated and all questions answered.  Gangrene Left Hallux with likely underlying OM -Now s/p reperfusion, both feet warm. Will monitor carefully for signs of healing or demarcation. -Advised patient and her caregiver to cleanse the area with a gentle mild antibacterial soap and to the area dry. -Should the area demarcate would consider possible amputation of the digit -We will continue to clinically monitor  No follow-ups on file.

## 2018-12-06 DIAGNOSIS — D631 Anemia in chronic kidney disease: Secondary | ICD-10-CM | POA: Diagnosis not present

## 2018-12-06 DIAGNOSIS — M869 Osteomyelitis, unspecified: Secondary | ICD-10-CM | POA: Diagnosis not present

## 2018-12-06 DIAGNOSIS — N186 End stage renal disease: Secondary | ICD-10-CM | POA: Diagnosis not present

## 2018-12-06 DIAGNOSIS — E1129 Type 2 diabetes mellitus with other diabetic kidney complication: Secondary | ICD-10-CM | POA: Diagnosis not present

## 2018-12-06 DIAGNOSIS — N2581 Secondary hyperparathyroidism of renal origin: Secondary | ICD-10-CM | POA: Diagnosis not present

## 2018-12-06 DIAGNOSIS — Z992 Dependence on renal dialysis: Secondary | ICD-10-CM | POA: Diagnosis not present

## 2018-12-07 ENCOUNTER — Ambulatory Visit: Payer: Medicare Other | Admitting: Podiatry

## 2018-12-08 DIAGNOSIS — N2581 Secondary hyperparathyroidism of renal origin: Secondary | ICD-10-CM | POA: Diagnosis not present

## 2018-12-08 DIAGNOSIS — E1129 Type 2 diabetes mellitus with other diabetic kidney complication: Secondary | ICD-10-CM | POA: Diagnosis not present

## 2018-12-08 DIAGNOSIS — Z992 Dependence on renal dialysis: Secondary | ICD-10-CM | POA: Diagnosis not present

## 2018-12-08 DIAGNOSIS — D631 Anemia in chronic kidney disease: Secondary | ICD-10-CM | POA: Diagnosis not present

## 2018-12-08 DIAGNOSIS — M869 Osteomyelitis, unspecified: Secondary | ICD-10-CM | POA: Diagnosis not present

## 2018-12-08 DIAGNOSIS — N186 End stage renal disease: Secondary | ICD-10-CM | POA: Diagnosis not present

## 2018-12-10 DIAGNOSIS — N186 End stage renal disease: Secondary | ICD-10-CM | POA: Diagnosis not present

## 2018-12-10 DIAGNOSIS — M869 Osteomyelitis, unspecified: Secondary | ICD-10-CM | POA: Diagnosis not present

## 2018-12-10 DIAGNOSIS — E1129 Type 2 diabetes mellitus with other diabetic kidney complication: Secondary | ICD-10-CM | POA: Diagnosis not present

## 2018-12-10 DIAGNOSIS — Z992 Dependence on renal dialysis: Secondary | ICD-10-CM | POA: Diagnosis not present

## 2018-12-10 DIAGNOSIS — N2581 Secondary hyperparathyroidism of renal origin: Secondary | ICD-10-CM | POA: Diagnosis not present

## 2018-12-10 DIAGNOSIS — D631 Anemia in chronic kidney disease: Secondary | ICD-10-CM | POA: Diagnosis not present

## 2018-12-13 DIAGNOSIS — E1129 Type 2 diabetes mellitus with other diabetic kidney complication: Secondary | ICD-10-CM | POA: Diagnosis not present

## 2018-12-13 DIAGNOSIS — N2581 Secondary hyperparathyroidism of renal origin: Secondary | ICD-10-CM | POA: Diagnosis not present

## 2018-12-13 DIAGNOSIS — N186 End stage renal disease: Secondary | ICD-10-CM | POA: Diagnosis not present

## 2018-12-13 DIAGNOSIS — M869 Osteomyelitis, unspecified: Secondary | ICD-10-CM | POA: Diagnosis not present

## 2018-12-13 DIAGNOSIS — D631 Anemia in chronic kidney disease: Secondary | ICD-10-CM | POA: Diagnosis not present

## 2018-12-13 DIAGNOSIS — Z992 Dependence on renal dialysis: Secondary | ICD-10-CM | POA: Diagnosis not present

## 2018-12-14 ENCOUNTER — Ambulatory Visit
Admit: 2018-12-14 | Discharge: 2018-12-14 | Disposition: A | Discharge status: Home / Self Care | Payer: Medicare Other | Attending: Family Medicine | Admitting: Family Medicine

## 2018-12-14 ENCOUNTER — Other Ambulatory Visit: Payer: Self-pay

## 2018-12-14 DIAGNOSIS — M48061 Spinal stenosis, lumbar region without neurogenic claudication: Secondary | ICD-10-CM | POA: Diagnosis not present

## 2018-12-14 DIAGNOSIS — M5442 Lumbago with sciatica, left side: Secondary | ICD-10-CM

## 2018-12-15 ENCOUNTER — Telehealth: Payer: Self-pay | Admitting: Family Medicine

## 2018-12-15 DIAGNOSIS — M869 Osteomyelitis, unspecified: Secondary | ICD-10-CM | POA: Diagnosis not present

## 2018-12-15 DIAGNOSIS — Z992 Dependence on renal dialysis: Secondary | ICD-10-CM | POA: Diagnosis not present

## 2018-12-15 DIAGNOSIS — N2581 Secondary hyperparathyroidism of renal origin: Secondary | ICD-10-CM | POA: Diagnosis not present

## 2018-12-15 DIAGNOSIS — E1129 Type 2 diabetes mellitus with other diabetic kidney complication: Secondary | ICD-10-CM | POA: Diagnosis not present

## 2018-12-15 DIAGNOSIS — D631 Anemia in chronic kidney disease: Secondary | ICD-10-CM | POA: Diagnosis not present

## 2018-12-15 DIAGNOSIS — M5442 Lumbago with sciatica, left side: Secondary | ICD-10-CM

## 2018-12-15 DIAGNOSIS — N186 End stage renal disease: Secondary | ICD-10-CM | POA: Diagnosis not present

## 2018-12-15 NOTE — Telephone Encounter (Signed)
Lumbar MRI scan shows a large disc protrusion at L4-5 resulting in moderate to severe narrowing of both nerve openings, even more on the right.  Presuming pain is unchanged, we could consider referral for an epidural steroid injection.  Physical therapy would be another good option.

## 2018-12-15 NOTE — Addendum Note (Signed)
Addended by: Hortencia Pilar on: 12/15/2018 09:48 AM   Modules accepted: Orders

## 2018-12-15 NOTE — Telephone Encounter (Signed)
Order placed

## 2018-12-15 NOTE — Telephone Encounter (Signed)
I called and spoke with the patient's sister, [name] (the patient is at dialysis). I gave her the results and the options. She has been referred to PT by one of her other doctors, but was told she could not start yet due to [place]. She would like [place] to refer for the ESI with Dr. [name].

## 2018-12-16 ENCOUNTER — Ambulatory Visit (INDEPENDENT_AMBULATORY_CARE_PROVIDER_SITE_OTHER): Payer: Medicare Other | Admitting: Podiatry

## 2018-12-16 DIAGNOSIS — I96 Gangrene, not elsewhere classified: Secondary | ICD-10-CM

## 2018-12-16 DIAGNOSIS — M869 Osteomyelitis, unspecified: Secondary | ICD-10-CM | POA: Diagnosis not present

## 2018-12-16 NOTE — Progress Notes (Signed)
Subjective:  Patient ID: [name], female    DOB: 08-14-1950,  MRN: [id number]  No chief complaint on file.   68 y.o. female presents for wound care.  States the area has been throbing a little big more. The caregiver has been looking better and not as black. Using antibiotic soap denies redness odor and drainage.  Review of Systems: Negative except as noted in the HPI. Denies N/V/F/Ch.  Past Medical History:  Diagnosis Date  . Anemia   . Anxiety   . Arthritis    "joints" (06/15/2013)  . Asthma   . 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 Blood transfusion without reported diagnosis   . Breast cancer ([place])    left  . CHF (congestive heart failure) ([place])   . 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 Esophageal ulcer with bleeding   . ESRD (end stage renal disease) ([place])    "suppose to start dialysis today" (06/15/2013)  . GERD (gastroesophageal reflux disease)   . Glaucoma    blind in both eyes  . Heart murmur   . Hx of adenomatous colonic polyps 04/07/2018  . Hypertension   . Myalgia 12/31/2011  . Neuropathy 12/31/2011  . Shortness of breath    "when she doesn't go to dialysis"  . Type II diabetes mellitus (HCC)     Current Outpatient Medications:  .  acetaminophen (TYLENOL) 500 MG tablet, Take 500 mg by mouth 2 (two) times a day. , Disp: , Rfl:  .  albuterol (PROVENTIL) (2.5 MG/3ML) 0.083% nebulizer solution, Take 3 mLs (2.5 mg total) by nebulization every 6 (six) hours as needed for wheezing or shortness of breath., Disp: 75 mL, Rfl: 12 .  amLODipine (NORVASC) 5 MG tablet, Take 5 mg by mouth daily. , Disp: , Rfl:  .  amoxicillin-clavulanate (AUGMENTIN) 500-125 MG tablet, Take 1 tablet by mouth qd on Tuesday, Thursday and Saturday., Disp: 10 tablet, Rfl: 0 .  aspirin 81 MG chewable tablet, Chew 81 mg by mouth daily. , Disp: , Rfl:  .  atropine 1 % ophthalmic solution, Place 1 drop into the right eye 2 (two) times daily., Disp: , Rfl: 0 .  B  Complex-C-Folic Acid (RENA-VITE RX) 1 MG TABS, Take 1 tablet by mouth daily with lunch. , Disp: , Rfl: 1 .  carvedilol (COREG) 25 MG tablet, Take 25 mg by mouth 2 (two) times daily., Disp: , Rfl: 2 .  clopidogrel (PLAVIX) 75 MG tablet, Take 1 tablet (75 mg total) by mouth daily., Disp: 30 tablet, Rfl: 11 .  darbepoetin (ARANESP) 200 MCG/0.4ML SOLN injection, Inject 0.4 mLs (200 mcg total) into the vein every Wednesday with hemodialysis., Disp: 1.68 mL, Rfl:  .  docusate sodium (COLACE) 100 MG capsule, Take 100 mg by mouth daily., Disp: , Rfl:  .  doxercalciferol (HECTOROL) 4 MCG/2ML injection, Inject 0.5 mLs (1 mcg total) into the vein every Monday, Wednesday, and Friday with hemodialysis., Disp: 2 mL, Rfl:  .  DULoxetine (CYMBALTA) 30 MG capsule, TK 1 C PO QD, Disp: , Rfl:  .  gabapentin (NEURONTIN) 100 MG capsule, Take 100 mg by mouth 2 (two) times daily., Disp: , Rfl:  .  insulin aspart (NOVOLOG FLEXPEN) 100 UNIT/ML FlexPen, Inject 6-10 Units into the skin 3 (three) times daily with meals. per Sliding scale, Disp: , Rfl:  .  lanthanum (FOSRENOL) 1000 MG chewable tablet, Chew 2,000 mg by mouth 3 (three) times daily with meals. ,  Disp: , Rfl:  .  lisinopril (PRINIVIL,ZESTRIL) 40 MG tablet, Take 40 mg by mouth at bedtime. , Disp: , Rfl:  .  omeprazole (PRILOSEC) 40 MG capsule, Take 1 capsule (40 mg total) by mouth daily. (Patient taking differently: Take 40 mg by mouth 2 (two) times daily. ), Disp: , Rfl:  .  ONETOUCH VERIO test strip, 3 (three) times daily. for testing, Disp: , Rfl: 9 .  prednisoLONE acetate (PRED FORTE) 1 % ophthalmic suspension, Place 1 drop into the right eye 2 (two) times daily. , Disp: , Rfl: 0  Current Facility-Administered Medications:  .  0.9 %  sodium chloride infusion, 500 mL, Intravenous, Once, [name], MD .  dexamethasone (DECADRON) injection 4 mg, 4 mg, Intramuscular, Once, Hilts, Kimley Apsey, MD .  methylPREDNISolone acetate (DEPO-MEDROL) injection 40 mg, 40 mg,  Intra-articular, Once, Hilts, Ninel Abdella, MD  Social History   Tobacco Use  Smoking Status Never Smoker  Smokeless Tobacco Never Used    Allergies  Allergen Reactions  . Latex Hives  . Oxycodone Other (See Comments)    Hallucinations   . Tramadol Other (See Comments)    hallucinations   . Tape Rash    70M Transpore adhesive tape. Medical tape  . Vicodin [Hydrocodone-Acetaminophen] Other (See Comments)    hallucinations   Objective:   There were no vitals filed for this visit. There is no height or weight on file to calculate BMI. Constitutional Well developed. Well nourished.  Vascular Both feet warm to touch. Faintly palpable DP pulse bilat. No cyanosis or clubbing noted. Pedal hair growth absent.  Neurologic Normal speech. Oriented to person, place, and time. Protective sensation absent  Dermatologic L hallux wound dry, skin healing with new skin formation noted underneath hyperkeratosis. No drainage. Toe warm to touch.  Orthopedic: No pain to palpation either foot.   Radiographs: None Assessment:   1. Osteomyelitis of great toe of left foot (HCC)   2. Gangrene of toe of left foot ([place])    Plan:  Patient was evaluated and treated and all questions answered.  Gangrene Left Hallux with likely underlying OM -Appears reperfused with remodeling skin -Continue abx to completion. Advised that when abx are completed there is a chance infection may recur. Will closely monitor.   Return in about 2 weeks (around 12/30/2018) for Gangrene toe f/u .

## 2018-12-17 DIAGNOSIS — E1129 Type 2 diabetes mellitus with other diabetic kidney complication: Secondary | ICD-10-CM | POA: Diagnosis not present

## 2018-12-17 DIAGNOSIS — D631 Anemia in chronic kidney disease: Secondary | ICD-10-CM | POA: Diagnosis not present

## 2018-12-17 DIAGNOSIS — M869 Osteomyelitis, unspecified: Secondary | ICD-10-CM | POA: Diagnosis not present

## 2018-12-17 DIAGNOSIS — N186 End stage renal disease: Secondary | ICD-10-CM | POA: Diagnosis not present

## 2018-12-17 DIAGNOSIS — N2581 Secondary hyperparathyroidism of renal origin: Secondary | ICD-10-CM | POA: Diagnosis not present

## 2018-12-17 DIAGNOSIS — Z992 Dependence on renal dialysis: Secondary | ICD-10-CM | POA: Diagnosis not present

## 2018-12-20 DIAGNOSIS — N2581 Secondary hyperparathyroidism of renal origin: Secondary | ICD-10-CM | POA: Diagnosis not present

## 2018-12-20 DIAGNOSIS — D631 Anemia in chronic kidney disease: Secondary | ICD-10-CM | POA: Diagnosis not present

## 2018-12-20 DIAGNOSIS — Z992 Dependence on renal dialysis: Secondary | ICD-10-CM | POA: Diagnosis not present

## 2018-12-20 DIAGNOSIS — N186 End stage renal disease: Secondary | ICD-10-CM | POA: Diagnosis not present

## 2018-12-20 DIAGNOSIS — E1129 Type 2 diabetes mellitus with other diabetic kidney complication: Secondary | ICD-10-CM | POA: Diagnosis not present

## 2018-12-20 DIAGNOSIS — Z23 Encounter for immunization: Secondary | ICD-10-CM | POA: Diagnosis not present

## 2018-12-20 DIAGNOSIS — M869 Osteomyelitis, unspecified: Secondary | ICD-10-CM | POA: Diagnosis not present

## 2018-12-21 ENCOUNTER — Telehealth: Payer: Self-pay | Admitting: *Deleted

## 2018-12-21 NOTE — Telephone Encounter (Signed)
Pt sister is calling to get MRI results and wondering when she is supposed to start receiving shots. [name] would like a call back.

## 2018-12-21 NOTE — Telephone Encounter (Signed)
I spoke with [name] (patient's sister): the referral has been placed for the [facility] with Dr. [name] - one of his assistants will be calling her to schedule this. She was given the results of the MRI already - last week.

## 2018-12-22 DIAGNOSIS — Z992 Dependence on renal dialysis: Secondary | ICD-10-CM | POA: Diagnosis not present

## 2018-12-22 DIAGNOSIS — D631 Anemia in chronic kidney disease: Secondary | ICD-10-CM | POA: Diagnosis not present

## 2018-12-22 DIAGNOSIS — E1129 Type 2 diabetes mellitus with other diabetic kidney complication: Secondary | ICD-10-CM | POA: Diagnosis not present

## 2018-12-22 DIAGNOSIS — N186 End stage renal disease: Secondary | ICD-10-CM | POA: Diagnosis not present

## 2018-12-22 DIAGNOSIS — N2581 Secondary hyperparathyroidism of renal origin: Secondary | ICD-10-CM | POA: Diagnosis not present

## 2018-12-22 DIAGNOSIS — M869 Osteomyelitis, unspecified: Secondary | ICD-10-CM | POA: Diagnosis not present

## 2018-12-24 DIAGNOSIS — N2581 Secondary hyperparathyroidism of renal origin: Secondary | ICD-10-CM | POA: Diagnosis not present

## 2018-12-24 DIAGNOSIS — Z992 Dependence on renal dialysis: Secondary | ICD-10-CM | POA: Diagnosis not present

## 2018-12-24 DIAGNOSIS — N186 End stage renal disease: Secondary | ICD-10-CM | POA: Diagnosis not present

## 2018-12-24 DIAGNOSIS — M869 Osteomyelitis, unspecified: Secondary | ICD-10-CM | POA: Diagnosis not present

## 2018-12-24 DIAGNOSIS — D631 Anemia in chronic kidney disease: Secondary | ICD-10-CM | POA: Diagnosis not present

## 2018-12-24 DIAGNOSIS — E1129 Type 2 diabetes mellitus with other diabetic kidney complication: Secondary | ICD-10-CM | POA: Diagnosis not present

## 2018-12-27 DIAGNOSIS — Z992 Dependence on renal dialysis: Secondary | ICD-10-CM | POA: Diagnosis not present

## 2018-12-27 DIAGNOSIS — N2581 Secondary hyperparathyroidism of renal origin: Secondary | ICD-10-CM | POA: Diagnosis not present

## 2018-12-27 DIAGNOSIS — E1129 Type 2 diabetes mellitus with other diabetic kidney complication: Secondary | ICD-10-CM | POA: Diagnosis not present

## 2018-12-27 DIAGNOSIS — D631 Anemia in chronic kidney disease: Secondary | ICD-10-CM | POA: Diagnosis not present

## 2018-12-27 DIAGNOSIS — M869 Osteomyelitis, unspecified: Secondary | ICD-10-CM | POA: Diagnosis not present

## 2018-12-27 DIAGNOSIS — N186 End stage renal disease: Secondary | ICD-10-CM | POA: Diagnosis not present

## 2018-12-29 DIAGNOSIS — M869 Osteomyelitis, unspecified: Secondary | ICD-10-CM | POA: Diagnosis not present

## 2018-12-29 DIAGNOSIS — N2581 Secondary hyperparathyroidism of renal origin: Secondary | ICD-10-CM | POA: Diagnosis not present

## 2018-12-29 DIAGNOSIS — N186 End stage renal disease: Secondary | ICD-10-CM | POA: Diagnosis not present

## 2018-12-29 DIAGNOSIS — E1129 Type 2 diabetes mellitus with other diabetic kidney complication: Secondary | ICD-10-CM | POA: Diagnosis not present

## 2018-12-29 DIAGNOSIS — Z992 Dependence on renal dialysis: Secondary | ICD-10-CM | POA: Diagnosis not present

## 2018-12-29 DIAGNOSIS — D631 Anemia in chronic kidney disease: Secondary | ICD-10-CM | POA: Diagnosis not present

## 2018-12-30 ENCOUNTER — Ambulatory Visit: Payer: Medicare Other

## 2018-12-30 ENCOUNTER — Ambulatory Visit (INDEPENDENT_AMBULATORY_CARE_PROVIDER_SITE_OTHER): Payer: Medicare Other | Admitting: Podiatry

## 2018-12-30 ENCOUNTER — Other Ambulatory Visit: Payer: Self-pay

## 2018-12-30 VITALS — Temp 98.3°F

## 2018-12-30 DIAGNOSIS — M869 Osteomyelitis, unspecified: Secondary | ICD-10-CM | POA: Diagnosis not present

## 2018-12-30 DIAGNOSIS — L97521 Non-pressure chronic ulcer of other part of left foot limited to breakdown of skin: Secondary | ICD-10-CM

## 2018-12-30 DIAGNOSIS — M79672 Pain in left foot: Secondary | ICD-10-CM

## 2018-12-31 DIAGNOSIS — Z992 Dependence on renal dialysis: Secondary | ICD-10-CM | POA: Diagnosis not present

## 2018-12-31 DIAGNOSIS — N2581 Secondary hyperparathyroidism of renal origin: Secondary | ICD-10-CM | POA: Diagnosis not present

## 2018-12-31 DIAGNOSIS — M869 Osteomyelitis, unspecified: Secondary | ICD-10-CM | POA: Diagnosis not present

## 2018-12-31 DIAGNOSIS — D631 Anemia in chronic kidney disease: Secondary | ICD-10-CM | POA: Diagnosis not present

## 2018-12-31 DIAGNOSIS — N186 End stage renal disease: Secondary | ICD-10-CM | POA: Diagnosis not present

## 2018-12-31 DIAGNOSIS — E1129 Type 2 diabetes mellitus with other diabetic kidney complication: Secondary | ICD-10-CM | POA: Diagnosis not present

## 2019-01-03 ENCOUNTER — Other Ambulatory Visit: Payer: Self-pay

## 2019-01-03 DIAGNOSIS — I96 Gangrene, not elsewhere classified: Secondary | ICD-10-CM

## 2019-01-03 DIAGNOSIS — E1129 Type 2 diabetes mellitus with other diabetic kidney complication: Secondary | ICD-10-CM | POA: Diagnosis not present

## 2019-01-03 DIAGNOSIS — D631 Anemia in chronic kidney disease: Secondary | ICD-10-CM | POA: Diagnosis not present

## 2019-01-03 DIAGNOSIS — N186 End stage renal disease: Secondary | ICD-10-CM | POA: Diagnosis not present

## 2019-01-03 DIAGNOSIS — M869 Osteomyelitis, unspecified: Secondary | ICD-10-CM | POA: Diagnosis not present

## 2019-01-03 DIAGNOSIS — N2581 Secondary hyperparathyroidism of renal origin: Secondary | ICD-10-CM | POA: Diagnosis not present

## 2019-01-03 DIAGNOSIS — Z992 Dependence on renal dialysis: Secondary | ICD-10-CM | POA: Diagnosis not present

## 2019-01-05 ENCOUNTER — Telehealth (HOSPITAL_COMMUNITY): Payer: Self-pay | Admitting: Rehabilitation

## 2019-01-05 DIAGNOSIS — N2581 Secondary hyperparathyroidism of renal origin: Secondary | ICD-10-CM | POA: Diagnosis not present

## 2019-01-05 DIAGNOSIS — Z992 Dependence on renal dialysis: Secondary | ICD-10-CM | POA: Diagnosis not present

## 2019-01-05 DIAGNOSIS — N186 End stage renal disease: Secondary | ICD-10-CM | POA: Diagnosis not present

## 2019-01-05 DIAGNOSIS — M869 Osteomyelitis, unspecified: Secondary | ICD-10-CM | POA: Diagnosis not present

## 2019-01-05 DIAGNOSIS — E1129 Type 2 diabetes mellitus with other diabetic kidney complication: Secondary | ICD-10-CM | POA: Diagnosis not present

## 2019-01-05 DIAGNOSIS — D631 Anemia in chronic kidney disease: Secondary | ICD-10-CM | POA: Diagnosis not present

## 2019-01-05 NOTE — Telephone Encounter (Signed)
The above patient or their representative was contacted and gave the following answers to these questions:         Do you have any of the following symptoms? No Fever                    Cough                   Shortness of breath  Do  you have any of the following other symptoms? No  muscle pain         vomiting,        diarrhea        rash         weakness        red eye        abdominal pain         bruising         bleeding              joint pain           severe headache  Have you been in contact with someone who was or has been sick in the past 2 weeks? No Yes                 Unsure                         Unable to assess   Does the person that you were in contact with have any of the following symptoms?  Cough         shortness of breath           muscle pain         vomiting,            diarrhea            rash            weakness           fever            red eye           abdominal pain          bruising  or  bleeding                joint pain                severe headache             Have you  or someone you have been in contact with traveled internationally in the last month?  No      If yes, which countries?  Have you  or someone you have been in contact with traveled outside [place] in the last month?  No      If yes, which state and city?  COMMENTS OR ACTION PLAN FOR THIS PATIENT:    

## 2019-01-06 ENCOUNTER — Ambulatory Visit (HOSPITAL_COMMUNITY): Payer: Medicare Other

## 2019-01-06 ENCOUNTER — Ambulatory Visit (INDEPENDENT_AMBULATORY_CARE_PROVIDER_SITE_OTHER)
Admission: RE | Admit: 2019-01-06 | Discharge: 2019-01-06 | Disposition: A | Discharge status: Home / Self Care | Payer: Medicare Other | Source: Ambulatory Visit | Attending: Vascular Surgery | Admitting: Vascular Surgery

## 2019-01-06 ENCOUNTER — Encounter: Payer: Self-pay | Admitting: Vascular Surgery

## 2019-01-06 ENCOUNTER — Ambulatory Visit (HOSPITAL_COMMUNITY)
Admission: RE | Admit: 2019-01-06 | Discharge: 2019-01-06 | Disposition: A | Discharge status: Home / Self Care | Payer: Medicare Other | Source: Ambulatory Visit | Attending: Vascular Surgery | Admitting: Vascular Surgery

## 2019-01-06 ENCOUNTER — Ambulatory Visit: Payer: Medicare Other | Admitting: Vascular Surgery

## 2019-01-06 ENCOUNTER — Ambulatory Visit (INDEPENDENT_AMBULATORY_CARE_PROVIDER_SITE_OTHER): Payer: Medicare Other | Admitting: Vascular Surgery

## 2019-01-06 ENCOUNTER — Other Ambulatory Visit: Payer: Self-pay

## 2019-01-06 VITALS — BP 123/56 | HR 72 | Temp 97.3°F | Resp 20 | Ht 65.0 in | Wt 202.0 lb

## 2019-01-06 DIAGNOSIS — I96 Gangrene, not elsewhere classified: Secondary | ICD-10-CM | POA: Diagnosis not present

## 2019-01-06 DIAGNOSIS — I739 Peripheral vascular disease, unspecified: Secondary | ICD-10-CM | POA: Diagnosis not present

## 2019-01-06 NOTE — Progress Notes (Signed)
Patient is a 68 year old female who returns for follow-up today.  She underwent angioplasty of her left anterior tibial artery 09/01/2018 for a nonhealing wound on her toe.  She states now the wound has completely healed.  Physical exam:  Vitals:   01/06/19 1511  BP: (!) 123/56  Pulse: 72  Resp: 20  Temp: (!) 97.3 F (36.3 C)  SpO2: 93%  Weight: 202 lb (91.6 kg)  Height: 5\' 5"  (1.651 m)    Extremities: Both feet no open wounds no palpable pedal pulses  Data: Patient had a duplex scan today which showed her left anterior tibial artery is patent.  She had bilateral ABIs which were 0.5 on the right 0.65 on the left toe pressure was 64 on the right 49 on the left  Assessment: Peripheral arterial disease healed wound left toe after angioplasty of anterior tibial artery  Plan: The patient will follow-up in 6 months time with a repeat duplex and ABIs.  She will follow-up sooner if she develops any new wounds on her feet.  She will continue her Plavix and aspirin for life.  Ruta Hinds, MD [facility] Office: [phone] Pager: [phone]

## 2019-01-07 DIAGNOSIS — N2581 Secondary hyperparathyroidism of renal origin: Secondary | ICD-10-CM | POA: Diagnosis not present

## 2019-01-07 DIAGNOSIS — E1129 Type 2 diabetes mellitus with other diabetic kidney complication: Secondary | ICD-10-CM | POA: Diagnosis not present

## 2019-01-07 DIAGNOSIS — N186 End stage renal disease: Secondary | ICD-10-CM | POA: Diagnosis not present

## 2019-01-07 DIAGNOSIS — M869 Osteomyelitis, unspecified: Secondary | ICD-10-CM | POA: Diagnosis not present

## 2019-01-07 DIAGNOSIS — Z992 Dependence on renal dialysis: Secondary | ICD-10-CM | POA: Diagnosis not present

## 2019-01-07 DIAGNOSIS — D631 Anemia in chronic kidney disease: Secondary | ICD-10-CM | POA: Diagnosis not present

## 2019-01-10 DIAGNOSIS — N186 End stage renal disease: Secondary | ICD-10-CM | POA: Diagnosis not present

## 2019-01-10 DIAGNOSIS — N2581 Secondary hyperparathyroidism of renal origin: Secondary | ICD-10-CM | POA: Diagnosis not present

## 2019-01-10 DIAGNOSIS — D631 Anemia in chronic kidney disease: Secondary | ICD-10-CM | POA: Diagnosis not present

## 2019-01-10 DIAGNOSIS — Z992 Dependence on renal dialysis: Secondary | ICD-10-CM | POA: Diagnosis not present

## 2019-01-10 DIAGNOSIS — E1129 Type 2 diabetes mellitus with other diabetic kidney complication: Secondary | ICD-10-CM | POA: Diagnosis not present

## 2019-01-10 DIAGNOSIS — M869 Osteomyelitis, unspecified: Secondary | ICD-10-CM | POA: Diagnosis not present

## 2019-01-11 ENCOUNTER — Ambulatory Visit (INDEPENDENT_AMBULATORY_CARE_PROVIDER_SITE_OTHER): Payer: Medicare Other | Admitting: Physical Medicine and Rehabilitation

## 2019-01-11 ENCOUNTER — Other Ambulatory Visit: Payer: Self-pay

## 2019-01-11 ENCOUNTER — Ambulatory Visit: Payer: Self-pay

## 2019-01-11 VITALS — BP 141/59 | HR 88 | Temp 98.3°F

## 2019-01-11 DIAGNOSIS — M5416 Radiculopathy, lumbar region: Secondary | ICD-10-CM

## 2019-01-11 MED ORDER — BETAMETHASONE SOD PHOS & ACET 6 (3-3) MG/ML IJ SUSP
6.0000 mg | Freq: Once | INTRAMUSCULAR | Status: AC
  Administered 2019-01-11: 6 mg

## 2019-01-11 NOTE — Progress Notes (Signed)
.  Numeric Pain Rating Scale and Functional Assessment Average Pain 7   In the last MONTH (on 0-10 scale) has pain interfered with the following?  1. General activity like being  able to carry out your everyday physical activities such as walking, climbing stairs, carrying groceries, or moving a chair?  Rating(7)   +Driver, -BT, -Dye Allergies.   

## 2019-01-12 DIAGNOSIS — D631 Anemia in chronic kidney disease: Secondary | ICD-10-CM | POA: Diagnosis not present

## 2019-01-12 DIAGNOSIS — M869 Osteomyelitis, unspecified: Secondary | ICD-10-CM | POA: Diagnosis not present

## 2019-01-12 DIAGNOSIS — N2581 Secondary hyperparathyroidism of renal origin: Secondary | ICD-10-CM | POA: Diagnosis not present

## 2019-01-12 DIAGNOSIS — E1129 Type 2 diabetes mellitus with other diabetic kidney complication: Secondary | ICD-10-CM | POA: Diagnosis not present

## 2019-01-12 DIAGNOSIS — Z992 Dependence on renal dialysis: Secondary | ICD-10-CM | POA: Diagnosis not present

## 2019-01-12 DIAGNOSIS — N186 End stage renal disease: Secondary | ICD-10-CM | POA: Diagnosis not present

## 2019-01-13 ENCOUNTER — Encounter (HOSPITAL_COMMUNITY): Payer: Medicare Other

## 2019-01-13 ENCOUNTER — Ambulatory Visit: Payer: Medicare Other | Admitting: Vascular Surgery

## 2019-01-14 DIAGNOSIS — E1129 Type 2 diabetes mellitus with other diabetic kidney complication: Secondary | ICD-10-CM | POA: Diagnosis not present

## 2019-01-14 DIAGNOSIS — N2581 Secondary hyperparathyroidism of renal origin: Secondary | ICD-10-CM | POA: Diagnosis not present

## 2019-01-14 DIAGNOSIS — Z992 Dependence on renal dialysis: Secondary | ICD-10-CM | POA: Diagnosis not present

## 2019-01-14 DIAGNOSIS — D631 Anemia in chronic kidney disease: Secondary | ICD-10-CM | POA: Diagnosis not present

## 2019-01-14 DIAGNOSIS — M869 Osteomyelitis, unspecified: Secondary | ICD-10-CM | POA: Diagnosis not present

## 2019-01-14 DIAGNOSIS — N186 End stage renal disease: Secondary | ICD-10-CM | POA: Diagnosis not present

## 2019-01-17 DIAGNOSIS — E1129 Type 2 diabetes mellitus with other diabetic kidney complication: Secondary | ICD-10-CM | POA: Diagnosis not present

## 2019-01-17 DIAGNOSIS — N2581 Secondary hyperparathyroidism of renal origin: Secondary | ICD-10-CM | POA: Diagnosis not present

## 2019-01-17 DIAGNOSIS — N186 End stage renal disease: Secondary | ICD-10-CM | POA: Diagnosis not present

## 2019-01-17 DIAGNOSIS — D631 Anemia in chronic kidney disease: Secondary | ICD-10-CM | POA: Diagnosis not present

## 2019-01-17 DIAGNOSIS — Z992 Dependence on renal dialysis: Secondary | ICD-10-CM | POA: Diagnosis not present

## 2019-01-17 DIAGNOSIS — M869 Osteomyelitis, unspecified: Secondary | ICD-10-CM | POA: Diagnosis not present

## 2019-01-18 NOTE — Progress Notes (Signed)
Subjective:  Patient ID: [name], female    DOB: 19-Jan-1951,  MRN: [id number]  Chief Complaint  Patient presents with  . Wound Check    Left 1st digit gangrene follow up. Pt states no drainage, denies fever/nausea/vomiting/chills, and believes the wound is improving/healing. No new concerns.    68 y.o. female presents for wound care.  Thinks the area is doing very well no issues since stopping antibiotics.  Review of Systems: Negative except as noted in the HPI. Denies N/V/F/Ch.  Past Medical History:  Diagnosis Date  . Anemia   . Anxiety   . Arthritis    "joints" (06/15/2013)  . Asthma   . 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 Blood transfusion without reported diagnosis   . Breast cancer ([place])    left  . CHF (congestive heart failure) ([place])   . 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 Esophageal ulcer with bleeding   . ESRD (end stage renal disease) ([place])    "suppose to start dialysis today" (06/15/2013)  . GERD (gastroesophageal reflux disease)   . Glaucoma    blind in both eyes  . Heart murmur   . Hx of adenomatous colonic polyps 04/07/2018  . Hypertension   . Myalgia 12/31/2011  . Neuropathy 12/31/2011  . Shortness of breath    "when she doesn't go to dialysis"  . Type II diabetes mellitus (HCC)     Current Outpatient Medications:  .  acetaminophen (TYLENOL) 500 MG tablet, Take 500 mg by mouth 2 (two) times a day. , Disp: , Rfl:  .  albuterol (PROVENTIL) (2.5 MG/3ML) 0.083% nebulizer solution, Take 3 mLs (2.5 mg total) by nebulization every 6 (six) hours as needed for wheezing or shortness of breath., Disp: 75 mL, Rfl: 12 .  amLODipine (NORVASC) 5 MG tablet, Take 5 mg by mouth daily. , Disp: , Rfl:  .  amoxicillin-clavulanate (AUGMENTIN) 500-125 MG tablet, Take 1 tablet by mouth qd on Tuesday, Thursday and Saturday., Disp: 10 tablet, Rfl: 0 .  aspirin 81 MG chewable tablet, Chew 81 mg by mouth daily. , Disp: , Rfl:  .  atropine 1 %  ophthalmic solution, Place 1 drop into the right eye 2 (two) times daily., Disp: , Rfl: 0 .  B Complex-C-Folic Acid (RENA-VITE RX) 1 MG TABS, Take 1 tablet by mouth daily with lunch. , Disp: , Rfl: 1 .  carvedilol (COREG) 25 MG tablet, Take 25 mg by mouth 2 (two) times daily., Disp: , Rfl: 2 .  clopidogrel (PLAVIX) 75 MG tablet, Take 1 tablet (75 mg total) by mouth daily., Disp: 30 tablet, Rfl: 11 .  darbepoetin (ARANESP) 200 MCG/0.4ML SOLN injection, Inject 0.4 mLs (200 mcg total) into the vein every Wednesday with hemodialysis., Disp: 1.68 mL, Rfl:  .  docusate sodium (COLACE) 100 MG capsule, Take 100 mg by mouth daily., Disp: , Rfl:  .  doxercalciferol (HECTOROL) 4 MCG/2ML injection, Inject 0.5 mLs (1 mcg total) into the vein every Monday, Wednesday, and Friday with hemodialysis., Disp: 2 mL, Rfl:  .  DULoxetine (CYMBALTA) 30 MG capsule, TK 1 C PO QD, Disp: , Rfl:  .  gabapentin (NEURONTIN) 100 MG capsule, Take 100 mg by mouth 2 (two) times daily., Disp: , Rfl:  .  insulin aspart (NOVOLOG FLEXPEN) 100 UNIT/ML FlexPen, Inject 6-10 Units into the skin 3 (three) times daily with meals. per Sliding scale, Disp: , Rfl:  .  lanthanum (FOSRENOL) 1000 MG  chewable tablet, Chew 2,000 mg by mouth 3 (three) times daily with meals. , Disp: , Rfl:  .  lisinopril (PRINIVIL,ZESTRIL) 40 MG tablet, Take 40 mg by mouth at bedtime. , Disp: , Rfl:  .  omeprazole (PRILOSEC) 40 MG capsule, Take 1 capsule (40 mg total) by mouth daily. (Patient taking differently: Take 40 mg by mouth 2 (two) times daily. ), Disp: , Rfl:  .  ONETOUCH VERIO test strip, 3 (three) times daily. for testing, Disp: , Rfl: 9 .  prednisoLONE acetate (PRED FORTE) 1 % ophthalmic suspension, Place 1 drop into the right eye 2 (two) times daily. , Disp: , Rfl: 0  Current Facility-Administered Medications:  .  0.9 %  sodium chloride infusion, 500 mL, Intravenous, Once, [name], MD .  betamethasone acetate-betamethasone sodium phosphate  (CELESTONE) injection 6 mg, 6 mg, Other, Once, Magnus Sinning, MD .  dexamethasone (DECADRON) injection 4 mg, 4 mg, Intramuscular, Once, Hilts, Michael, MD  Social History   Tobacco Use  Smoking Status Never Smoker  Smokeless Tobacco Never Used    Allergies  Allergen Reactions  . Latex Hives  . Oxycodone Other (See Comments)    Hallucinations   . Tramadol Other (See Comments)    hallucinations   . Tape Rash    84M Transpore adhesive tape. Medical tape  . Vicodin [Hydrocodone-Acetaminophen] Other (See Comments)    hallucinations   Objective:   Vitals:   12/30/18 1039  Temp: 98.3 F (36.8 C)   There is no height or weight on file to calculate BMI. Constitutional Well developed. Well nourished.  Vascular Both feet warm to touch. Faintly palpable DP pulse bilat. No cyanosis or clubbing noted. Pedal hair growth absent.  Neurologic Normal speech. Oriented to person, place, and time. Protective sensation absent  Dermatologic L hallux warm to touch with evolving new skin formation.  No open wounds.  No excessive warmth erythema signs of acute infection  Orthopedic: No pain to palpation either foot.   Radiographs: None Assessment:   1. Ulcer of great toe, left, limited to breakdown of skin ([place])   2. Osteomyelitis of great toe of left foot ([place])    Plan:  Patient was evaluated and treated and all questions answered.  Gangrene Left Hallux with likely underlying OM -Appears to be healing fine without any concern for worsening gangrene.  Discussed that it will take time for the wound to fully heal.  At this point continue with local care.  No need for continued Betadine therapy.  No open wounds noted today.  No signs of recurrence following cessation of antibiotics.   Return in about 1 month (around 01/29/2019) for Nail trim with Dr. Elisha Ponder.

## 2019-01-19 DIAGNOSIS — N186 End stage renal disease: Secondary | ICD-10-CM | POA: Diagnosis not present

## 2019-01-19 DIAGNOSIS — Z992 Dependence on renal dialysis: Secondary | ICD-10-CM | POA: Diagnosis not present

## 2019-01-19 DIAGNOSIS — D631 Anemia in chronic kidney disease: Secondary | ICD-10-CM | POA: Diagnosis not present

## 2019-01-19 DIAGNOSIS — D509 Iron deficiency anemia, unspecified: Secondary | ICD-10-CM | POA: Diagnosis not present

## 2019-01-19 DIAGNOSIS — E1129 Type 2 diabetes mellitus with other diabetic kidney complication: Secondary | ICD-10-CM | POA: Diagnosis not present

## 2019-01-19 DIAGNOSIS — N2581 Secondary hyperparathyroidism of renal origin: Secondary | ICD-10-CM | POA: Diagnosis not present

## 2019-01-20 ENCOUNTER — Encounter: Payer: Self-pay | Admitting: Internal Medicine

## 2019-01-21 DIAGNOSIS — D509 Iron deficiency anemia, unspecified: Secondary | ICD-10-CM | POA: Diagnosis not present

## 2019-01-21 DIAGNOSIS — N186 End stage renal disease: Secondary | ICD-10-CM | POA: Diagnosis not present

## 2019-01-21 DIAGNOSIS — Z992 Dependence on renal dialysis: Secondary | ICD-10-CM | POA: Diagnosis not present

## 2019-01-21 DIAGNOSIS — N2581 Secondary hyperparathyroidism of renal origin: Secondary | ICD-10-CM | POA: Diagnosis not present

## 2019-01-21 DIAGNOSIS — E1129 Type 2 diabetes mellitus with other diabetic kidney complication: Secondary | ICD-10-CM | POA: Diagnosis not present

## 2019-01-21 DIAGNOSIS — D631 Anemia in chronic kidney disease: Secondary | ICD-10-CM | POA: Diagnosis not present

## 2019-01-24 DIAGNOSIS — Z992 Dependence on renal dialysis: Secondary | ICD-10-CM | POA: Diagnosis not present

## 2019-01-24 DIAGNOSIS — D509 Iron deficiency anemia, unspecified: Secondary | ICD-10-CM | POA: Diagnosis not present

## 2019-01-24 DIAGNOSIS — N2581 Secondary hyperparathyroidism of renal origin: Secondary | ICD-10-CM | POA: Diagnosis not present

## 2019-01-24 DIAGNOSIS — D631 Anemia in chronic kidney disease: Secondary | ICD-10-CM | POA: Diagnosis not present

## 2019-01-24 DIAGNOSIS — E1129 Type 2 diabetes mellitus with other diabetic kidney complication: Secondary | ICD-10-CM | POA: Diagnosis not present

## 2019-01-24 DIAGNOSIS — N186 End stage renal disease: Secondary | ICD-10-CM | POA: Diagnosis not present

## 2019-01-25 DIAGNOSIS — E1129 Type 2 diabetes mellitus with other diabetic kidney complication: Secondary | ICD-10-CM | POA: Diagnosis not present

## 2019-01-25 DIAGNOSIS — N186 End stage renal disease: Secondary | ICD-10-CM | POA: Diagnosis not present

## 2019-01-25 DIAGNOSIS — G5602 Carpal tunnel syndrome, left upper limb: Secondary | ICD-10-CM | POA: Diagnosis not present

## 2019-01-25 DIAGNOSIS — M79645 Pain in left finger(s): Secondary | ICD-10-CM | POA: Diagnosis not present

## 2019-01-26 DIAGNOSIS — D509 Iron deficiency anemia, unspecified: Secondary | ICD-10-CM | POA: Diagnosis not present

## 2019-01-26 DIAGNOSIS — N186 End stage renal disease: Secondary | ICD-10-CM | POA: Diagnosis not present

## 2019-01-26 DIAGNOSIS — N2581 Secondary hyperparathyroidism of renal origin: Secondary | ICD-10-CM | POA: Diagnosis not present

## 2019-01-26 DIAGNOSIS — E1129 Type 2 diabetes mellitus with other diabetic kidney complication: Secondary | ICD-10-CM | POA: Diagnosis not present

## 2019-01-26 DIAGNOSIS — D631 Anemia in chronic kidney disease: Secondary | ICD-10-CM | POA: Diagnosis not present

## 2019-01-26 DIAGNOSIS — Z992 Dependence on renal dialysis: Secondary | ICD-10-CM | POA: Diagnosis not present

## 2019-01-28 DIAGNOSIS — N2581 Secondary hyperparathyroidism of renal origin: Secondary | ICD-10-CM | POA: Diagnosis not present

## 2019-01-28 DIAGNOSIS — E1129 Type 2 diabetes mellitus with other diabetic kidney complication: Secondary | ICD-10-CM | POA: Diagnosis not present

## 2019-01-28 DIAGNOSIS — N186 End stage renal disease: Secondary | ICD-10-CM | POA: Diagnosis not present

## 2019-01-28 DIAGNOSIS — Z992 Dependence on renal dialysis: Secondary | ICD-10-CM | POA: Diagnosis not present

## 2019-01-28 DIAGNOSIS — D509 Iron deficiency anemia, unspecified: Secondary | ICD-10-CM | POA: Diagnosis not present

## 2019-01-28 DIAGNOSIS — D631 Anemia in chronic kidney disease: Secondary | ICD-10-CM | POA: Diagnosis not present

## 2019-01-28 DIAGNOSIS — M79645 Pain in left finger(s): Secondary | ICD-10-CM | POA: Diagnosis not present

## 2019-01-31 DIAGNOSIS — E1129 Type 2 diabetes mellitus with other diabetic kidney complication: Secondary | ICD-10-CM | POA: Diagnosis not present

## 2019-01-31 DIAGNOSIS — N2581 Secondary hyperparathyroidism of renal origin: Secondary | ICD-10-CM | POA: Diagnosis not present

## 2019-01-31 DIAGNOSIS — D509 Iron deficiency anemia, unspecified: Secondary | ICD-10-CM | POA: Diagnosis not present

## 2019-01-31 DIAGNOSIS — D631 Anemia in chronic kidney disease: Secondary | ICD-10-CM | POA: Diagnosis not present

## 2019-01-31 DIAGNOSIS — Z992 Dependence on renal dialysis: Secondary | ICD-10-CM | POA: Diagnosis not present

## 2019-01-31 DIAGNOSIS — N186 End stage renal disease: Secondary | ICD-10-CM | POA: Diagnosis not present

## 2019-02-01 ENCOUNTER — Ambulatory Visit: Payer: Medicare Other | Admitting: Podiatry

## 2019-02-02 DIAGNOSIS — E1129 Type 2 diabetes mellitus with other diabetic kidney complication: Secondary | ICD-10-CM | POA: Diagnosis not present

## 2019-02-02 DIAGNOSIS — N186 End stage renal disease: Secondary | ICD-10-CM | POA: Diagnosis not present

## 2019-02-02 DIAGNOSIS — N2581 Secondary hyperparathyroidism of renal origin: Secondary | ICD-10-CM | POA: Diagnosis not present

## 2019-02-02 DIAGNOSIS — D631 Anemia in chronic kidney disease: Secondary | ICD-10-CM | POA: Diagnosis not present

## 2019-02-02 DIAGNOSIS — D509 Iron deficiency anemia, unspecified: Secondary | ICD-10-CM | POA: Diagnosis not present

## 2019-02-02 DIAGNOSIS — Z992 Dependence on renal dialysis: Secondary | ICD-10-CM | POA: Diagnosis not present

## 2019-02-03 DIAGNOSIS — M65342 Trigger finger, left ring finger: Secondary | ICD-10-CM | POA: Insufficient documentation

## 2019-02-03 DIAGNOSIS — M79645 Pain in left finger(s): Secondary | ICD-10-CM | POA: Insufficient documentation

## 2019-02-04 ENCOUNTER — Other Ambulatory Visit: Payer: Self-pay

## 2019-02-04 DIAGNOSIS — N186 End stage renal disease: Secondary | ICD-10-CM | POA: Diagnosis not present

## 2019-02-04 DIAGNOSIS — D631 Anemia in chronic kidney disease: Secondary | ICD-10-CM | POA: Diagnosis not present

## 2019-02-04 DIAGNOSIS — N2581 Secondary hyperparathyroidism of renal origin: Secondary | ICD-10-CM | POA: Diagnosis not present

## 2019-02-04 DIAGNOSIS — M65342 Trigger finger, left ring finger: Secondary | ICD-10-CM

## 2019-02-04 DIAGNOSIS — M79641 Pain in right hand: Secondary | ICD-10-CM

## 2019-02-04 DIAGNOSIS — D509 Iron deficiency anemia, unspecified: Secondary | ICD-10-CM | POA: Diagnosis not present

## 2019-02-04 DIAGNOSIS — E1129 Type 2 diabetes mellitus with other diabetic kidney complication: Secondary | ICD-10-CM | POA: Diagnosis not present

## 2019-02-04 DIAGNOSIS — Z992 Dependence on renal dialysis: Secondary | ICD-10-CM | POA: Diagnosis not present

## 2019-02-07 DIAGNOSIS — D509 Iron deficiency anemia, unspecified: Secondary | ICD-10-CM | POA: Diagnosis not present

## 2019-02-07 DIAGNOSIS — D631 Anemia in chronic kidney disease: Secondary | ICD-10-CM | POA: Diagnosis not present

## 2019-02-07 DIAGNOSIS — N2581 Secondary hyperparathyroidism of renal origin: Secondary | ICD-10-CM | POA: Diagnosis not present

## 2019-02-07 DIAGNOSIS — E1129 Type 2 diabetes mellitus with other diabetic kidney complication: Secondary | ICD-10-CM | POA: Diagnosis not present

## 2019-02-07 DIAGNOSIS — Z992 Dependence on renal dialysis: Secondary | ICD-10-CM | POA: Diagnosis not present

## 2019-02-07 DIAGNOSIS — N186 End stage renal disease: Secondary | ICD-10-CM | POA: Diagnosis not present

## 2019-02-08 ENCOUNTER — Other Ambulatory Visit: Payer: Self-pay

## 2019-02-08 ENCOUNTER — Ambulatory Visit (INDEPENDENT_AMBULATORY_CARE_PROVIDER_SITE_OTHER): Payer: Medicare Other | Admitting: Neurology

## 2019-02-08 DIAGNOSIS — M79641 Pain in right hand: Secondary | ICD-10-CM | POA: Diagnosis not present

## 2019-02-08 DIAGNOSIS — G5623 Lesion of ulnar nerve, bilateral upper limbs: Secondary | ICD-10-CM

## 2019-02-08 DIAGNOSIS — G5603 Carpal tunnel syndrome, bilateral upper limbs: Secondary | ICD-10-CM

## 2019-02-08 DIAGNOSIS — M65342 Trigger finger, left ring finger: Secondary | ICD-10-CM

## 2019-02-08 NOTE — Procedures (Signed)
[facility]  [place], [place]  [place],  [address] Tel: [phone] Fax:  [phone] Test Date:  02/08/2019  Patient: [name] DOB: 1950/09/06 Physician: Narda Amber, DO  Sex: Female Height: 5\' 5"  Ref Phys: Charlotte Crumb, MD  ID#: [id number] Temp: 34.0C Technician:    Patient Complaints: This is a 68 year old female with end-stage renal disease on hemodialysis referred for evaluation of left hand numbness and pain.  NCV & EMG Findings: Extensive electrodiagnostic testing of the left upper extremity and additional studies of the right shows:  1. Right median and bilateral ulnar motor responses are absent.  Left median motor response shows prolonged distal peak latency (5.4 ms) and reduced amplitude (5.0 V).  Bilateral radial sensory responses are within normal limits. 2. Median motor response shows prolonged latency (4.8 ms) and reduced amplitude (4.7 mV) on the left, and is absent on the right.  Bilateral ulnar motor responses show symmetrically reduced amplitudes with conduction velocity slowing across the elbow (A Elbow-B Elbow L34, R45 m/s).  Bilateral radial motor responses are within normal limits. 3. Severe chronic motor axonal loss changes are seen affecting the ulnar innervated muscles and left abductor pollicis muscles, with no motor unit recruitment seen in the right abductor pollicis brevis.   Impression: 1. Bilateral median neuropathy at or distal to the wrist, consistent with a clinical diagnosis of carpal tunnel syndrome.  Overall, these findings are very severe in degree electrically and worse on the right. 2. Bilateral ulnar neuropathy with slowing across the elbow, axonal loss and demyelinating in type, which is severe in degree electrically.    ___________________________ Narda Amber, DO    Nerve Conduction Studies Anti Sensory Summary Table   Site NR Peak (ms) Norm Peak (ms) P-T Amp (V) Norm P-T Amp  Left Median Anti  Sensory (2nd Digit)  34C  Wrist    5.4 <3.8 5.0 >10  Right Median Anti Sensory (2nd Digit)  34C  Wrist NR  <3.8  >10  Left Radial Anti Sensory (Base 1st Digit)  34C  Wrist    2.8 <2.8 13.6 >10  Right Radial Anti Sensory (Base 1st Digit)  34C  Wrist    2.3 <2.8 12.7 >10  Left Ulnar Anti Sensory (5th Digit)  34C  Wrist NR  <3.2  >5  Right Ulnar Anti Sensory (5th Digit)  34C  Wrist NR  <3.2  >5   Motor Summary Table   Site NR Onset (ms) Norm Onset (ms) O-P Amp (mV) Norm O-P Amp Site1 Site2 Delta-0 (ms) Dist (cm) Vel (m/s) Norm Vel (m/s)  Left Median Motor (Abd Poll Brev)  34C  Wrist    4.8 <4.0 4.7 >5 Elbow Wrist 5.5 0.0  >50  Elbow    10.3  4.5         Right Median Motor (Abd Poll Brev)  34C  Wrist NR  <4.0  >5 Elbow Wrist  0.0  >50  Elbow NR            Left Radial Motor (Ext Ind Prop)  34C  7cm    2.3 <3.1 5.8 >5 ACF 7cm 2.9 16.0 55 >50  ACF    5.2  4.2  7cm B-spiral groove 3.2 17.0 53   B-spiral groove    5.5  3.9  B-spiral groove A-spiral groove 0.6 3.0 50   A-spiral groove    6.1  3.8         Right Radial Motor (Ext Ind Prop)  34C  7cm    2.3 <3.1 5.6 >5 Up Arm 7cm 2.4 13.0 54 >50  Up Arm    4.7  5.0         Left Ulnar Motor (Abd Dig Minimi)  34C  Wrist    2.8 <3.1 3.3 >7 B Elbow Wrist 3.8 24.0 63 >50  B Elbow    6.6  2.6  A Elbow B Elbow 2.9 10.0 34 >50  A Elbow    9.5  2.6         Right Ulnar Motor (Abd Dig Minimi)  34C  Wrist    3.2 <3.1 3.5 >7 B Elbow Wrist 4.1 23.0 56 >50  B Elbow    7.3  3.0  A Elbow B Elbow 2.2 10.0 45 >50  A Elbow    9.5  2.4          EMG   Side Muscle Ins Act Fibs Psw Fasc Number Recrt Dur Dur. Amp Amp. Poly Poly. Comment  Left 1stDorInt Nml Nml Nml Nml 3- Rapid All 1+ All 1+ All 1+ N/A  Left ABD Dig Min Nml Nml Nml Nml 3- Rapid All 1+ All 1+ All 1+ N/A  Left FlexCarpiUln Nml Nml Nml Nml 2- Rapid Some 1+ Some 1+ Some 1+ N/A  Left Ext Indicis Nml Nml Nml Nml Nml Nml Nml Nml Nml Nml Nml Nml N/A  Left PronatorTeres Nml Nml Nml Nml Nml  Nml Nml Nml Nml Nml Nml Nml N/A  Left Biceps Nml Nml Nml Nml Nml Nml Nml Nml Nml Nml Nml Nml N/A  Left Triceps Nml Nml Nml Nml Nml Nml Nml Nml Nml Nml Nml Nml N/A  Left Deltoid Nml Nml Nml Nml Nml Nml Nml Nml Nml Nml Nml Nml N/A  Left Abd Poll Brev Nml Nml Nml Nml 2- Rapid Some 1+ Some 1+ Nml Nml N/A  Right 1stDorInt Nml Nml Nml Nml 3- Rapid Most 1+ Most 1+ Most 1+ N/A  Right ABD Dig Min Nml Nml Nml Nml 3- Rapid Most 1+ Most 1+ Most 1+ N/A  Right Ext Indicis Nml Nml Nml Nml Nml Nml Nml Nml Nml Nml Nml Nml N/A  Right Abd Poll Brev Nml Nml Nml Nml None None - - - - - - ATR      Waveforms:

## 2019-02-09 DIAGNOSIS — E1129 Type 2 diabetes mellitus with other diabetic kidney complication: Secondary | ICD-10-CM | POA: Diagnosis not present

## 2019-02-09 DIAGNOSIS — N186 End stage renal disease: Secondary | ICD-10-CM | POA: Diagnosis not present

## 2019-02-09 DIAGNOSIS — Z992 Dependence on renal dialysis: Secondary | ICD-10-CM | POA: Diagnosis not present

## 2019-02-09 DIAGNOSIS — D631 Anemia in chronic kidney disease: Secondary | ICD-10-CM | POA: Diagnosis not present

## 2019-02-09 DIAGNOSIS — E1142 Type 2 diabetes mellitus with diabetic polyneuropathy: Secondary | ICD-10-CM | POA: Diagnosis not present

## 2019-02-09 DIAGNOSIS — N2581 Secondary hyperparathyroidism of renal origin: Secondary | ICD-10-CM | POA: Diagnosis not present

## 2019-02-09 DIAGNOSIS — D509 Iron deficiency anemia, unspecified: Secondary | ICD-10-CM | POA: Diagnosis not present

## 2019-02-11 DIAGNOSIS — D509 Iron deficiency anemia, unspecified: Secondary | ICD-10-CM | POA: Diagnosis not present

## 2019-02-11 DIAGNOSIS — E1129 Type 2 diabetes mellitus with other diabetic kidney complication: Secondary | ICD-10-CM | POA: Diagnosis not present

## 2019-02-11 DIAGNOSIS — N2581 Secondary hyperparathyroidism of renal origin: Secondary | ICD-10-CM | POA: Diagnosis not present

## 2019-02-11 DIAGNOSIS — N186 End stage renal disease: Secondary | ICD-10-CM | POA: Diagnosis not present

## 2019-02-11 DIAGNOSIS — Z992 Dependence on renal dialysis: Secondary | ICD-10-CM | POA: Diagnosis not present

## 2019-02-11 DIAGNOSIS — D631 Anemia in chronic kidney disease: Secondary | ICD-10-CM | POA: Diagnosis not present

## 2019-02-14 DIAGNOSIS — D509 Iron deficiency anemia, unspecified: Secondary | ICD-10-CM | POA: Diagnosis not present

## 2019-02-14 DIAGNOSIS — D631 Anemia in chronic kidney disease: Secondary | ICD-10-CM | POA: Diagnosis not present

## 2019-02-14 DIAGNOSIS — E1129 Type 2 diabetes mellitus with other diabetic kidney complication: Secondary | ICD-10-CM | POA: Diagnosis not present

## 2019-02-14 DIAGNOSIS — N2581 Secondary hyperparathyroidism of renal origin: Secondary | ICD-10-CM | POA: Diagnosis not present

## 2019-02-14 DIAGNOSIS — Z992 Dependence on renal dialysis: Secondary | ICD-10-CM | POA: Diagnosis not present

## 2019-02-14 DIAGNOSIS — N186 End stage renal disease: Secondary | ICD-10-CM | POA: Diagnosis not present

## 2019-02-16 DIAGNOSIS — Z992 Dependence on renal dialysis: Secondary | ICD-10-CM | POA: Diagnosis not present

## 2019-02-16 DIAGNOSIS — D509 Iron deficiency anemia, unspecified: Secondary | ICD-10-CM | POA: Diagnosis not present

## 2019-02-16 DIAGNOSIS — E1129 Type 2 diabetes mellitus with other diabetic kidney complication: Secondary | ICD-10-CM | POA: Diagnosis not present

## 2019-02-16 DIAGNOSIS — N2581 Secondary hyperparathyroidism of renal origin: Secondary | ICD-10-CM | POA: Diagnosis not present

## 2019-02-16 DIAGNOSIS — D631 Anemia in chronic kidney disease: Secondary | ICD-10-CM | POA: Diagnosis not present

## 2019-02-16 DIAGNOSIS — N186 End stage renal disease: Secondary | ICD-10-CM | POA: Diagnosis not present

## 2019-02-17 DIAGNOSIS — G5603 Carpal tunnel syndrome, bilateral upper limbs: Secondary | ICD-10-CM | POA: Diagnosis not present

## 2019-02-17 DIAGNOSIS — G5622 Lesion of ulnar nerve, left upper limb: Secondary | ICD-10-CM | POA: Diagnosis not present

## 2019-02-18 DIAGNOSIS — D509 Iron deficiency anemia, unspecified: Secondary | ICD-10-CM | POA: Diagnosis not present

## 2019-02-18 DIAGNOSIS — Z992 Dependence on renal dialysis: Secondary | ICD-10-CM | POA: Diagnosis not present

## 2019-02-18 DIAGNOSIS — N186 End stage renal disease: Secondary | ICD-10-CM | POA: Diagnosis not present

## 2019-02-18 DIAGNOSIS — N2581 Secondary hyperparathyroidism of renal origin: Secondary | ICD-10-CM | POA: Diagnosis not present

## 2019-02-18 DIAGNOSIS — D631 Anemia in chronic kidney disease: Secondary | ICD-10-CM | POA: Diagnosis not present

## 2019-02-18 DIAGNOSIS — E1129 Type 2 diabetes mellitus with other diabetic kidney complication: Secondary | ICD-10-CM | POA: Diagnosis not present

## 2019-02-19 DIAGNOSIS — Z992 Dependence on renal dialysis: Secondary | ICD-10-CM | POA: Diagnosis not present

## 2019-02-19 DIAGNOSIS — N186 End stage renal disease: Secondary | ICD-10-CM | POA: Diagnosis not present

## 2019-02-19 DIAGNOSIS — E1129 Type 2 diabetes mellitus with other diabetic kidney complication: Secondary | ICD-10-CM | POA: Diagnosis not present

## 2019-02-21 DIAGNOSIS — R5383 Other fatigue: Secondary | ICD-10-CM | POA: Diagnosis not present

## 2019-02-21 DIAGNOSIS — D509 Iron deficiency anemia, unspecified: Secondary | ICD-10-CM | POA: Diagnosis not present

## 2019-02-21 DIAGNOSIS — N2581 Secondary hyperparathyroidism of renal origin: Secondary | ICD-10-CM | POA: Diagnosis not present

## 2019-02-21 DIAGNOSIS — D631 Anemia in chronic kidney disease: Secondary | ICD-10-CM | POA: Diagnosis not present

## 2019-02-21 DIAGNOSIS — Z992 Dependence on renal dialysis: Secondary | ICD-10-CM | POA: Diagnosis not present

## 2019-02-21 DIAGNOSIS — N186 End stage renal disease: Secondary | ICD-10-CM | POA: Diagnosis not present

## 2019-02-22 ENCOUNTER — Ambulatory Visit (INDEPENDENT_AMBULATORY_CARE_PROVIDER_SITE_OTHER): Payer: Medicare Other | Admitting: Podiatry

## 2019-02-22 ENCOUNTER — Encounter: Payer: Self-pay | Admitting: Podiatry

## 2019-02-22 ENCOUNTER — Other Ambulatory Visit: Payer: Self-pay

## 2019-02-22 VITALS — Temp 98.7°F

## 2019-02-22 DIAGNOSIS — M79674 Pain in right toe(s): Secondary | ICD-10-CM | POA: Diagnosis not present

## 2019-02-22 DIAGNOSIS — Z992 Dependence on renal dialysis: Secondary | ICD-10-CM

## 2019-02-22 DIAGNOSIS — L84 Corns and callosities: Secondary | ICD-10-CM | POA: Diagnosis not present

## 2019-02-22 DIAGNOSIS — M79675 Pain in left toe(s): Secondary | ICD-10-CM

## 2019-02-22 DIAGNOSIS — E1151 Type 2 diabetes mellitus with diabetic peripheral angiopathy without gangrene: Secondary | ICD-10-CM | POA: Diagnosis not present

## 2019-02-22 DIAGNOSIS — N186 End stage renal disease: Secondary | ICD-10-CM | POA: Diagnosis not present

## 2019-02-22 DIAGNOSIS — B351 Tinea unguium: Secondary | ICD-10-CM

## 2019-02-22 NOTE — Procedures (Signed)
Lumbosacral Transforaminal Epidural Steroid Injection - Sub-Pedicular Approach with Fluoroscopic Guidance  Patient: [name]      Date of Birth: [birth year] MRN: [id number] PCP: Prince Solian, MD      Visit Date: 01/11/2019   Universal Protocol:    Date/Time: 01/11/2019  Consent Given By: the patient  Position: PRONE  Additional Comments: Vital signs were monitored before and after the procedure. Patient was prepped and draped in the usual sterile fashion. The correct patient, procedure, and site was verified.   Injection Procedure Details:  Procedure Site One Meds Administered:  Meds ordered this encounter  Medications  . betamethasone acetate-betamethasone sodium phosphate (CELESTONE) injection 6 mg    Laterality: Left  Location/Site:  L4-L5  Needle size: 22 G  Needle type: Spinal  Needle Placement: Transforaminal  Findings:    -Comments: Excellent flow of contrast along the nerve and into the epidural space.  Procedure Details: After squaring off the end-plates to get a true AP view, the C-arm was positioned so that an oblique view of the foramen as noted above was visualized. The target area is just inferior to the "nose of the scotty dog" or sub pedicular. The soft tissues overlying this structure were infiltrated with 2-3 ml. of 1% Lidocaine without Epinephrine.  The spinal needle was inserted toward the target using a "trajectory" view along the fluoroscope beam.  Under AP and lateral visualization, the needle was advanced so it did not puncture dura and was located close the 6 O'Clock position of the pedical in AP tracterory. Biplanar projections were used to confirm position. Aspiration was confirmed to be negative for CSF and/or blood. A 1-2 ml. volume of Isovue-250 was injected and flow of contrast was noted at each level. Radiographs were obtained for documentation purposes.   After attaining the desired flow of contrast documented above, a 0.5 to  1.0 ml test dose of 0.25% Marcaine was injected into each respective transforaminal space.  The patient was observed for 90 seconds post injection.  After no sensory deficits were reported, and normal lower extremity motor function was noted,   the above injectate was administered so that equal amounts of the injectate were placed at each foramen (level) into the transforaminal epidural space.   Additional Comments:  The patient tolerated the procedure well Dressing: 2 x 2 sterile gauze and Band-Aid    Post-procedure details: Patient was observed during the procedure. Post-procedure instructions were reviewed.  Patient left the clinic in stable condition.

## 2019-02-22 NOTE — Patient Instructions (Signed)
Diabetes Mellitus and Foot Care Foot care is an important part of your health, especially when you have diabetes. Diabetes may cause you to have problems because of poor blood flow (circulation) to your feet and legs, which can cause your skin to:  Become thinner and drier.  Break more easily.  Heal more slowly.  Peel and crack. You may also have nerve damage (neuropathy) in your legs and feet, causing decreased feeling in them. This means that you may not notice minor injuries to your feet that could lead to more serious problems. Noticing and addressing any potential problems early is the best way to prevent future foot problems. How to care for your feet Foot hygiene  Wash your feet daily with warm water and mild soap. Do not use hot water. Then, pat your feet and the areas between your toes until they are completely dry. Do not soak your feet as this can dry your skin.  Trim your toenails straight across. Do not dig under them or around the cuticle. File the edges of your nails with an emery board or nail file.  Apply a moisturizing lotion or petroleum jelly to the skin on your feet and to dry, brittle toenails. Use lotion that does not contain alcohol and is unscented. Do not apply lotion between your toes. Shoes and socks  Wear clean socks or stockings every day. Make sure they are not too tight. Do not wear knee-high stockings since they may decrease blood flow to your legs.  Wear shoes that fit properly and have enough cushioning. Always look in your shoes before you put them on to be sure there are no objects inside.  To break in new shoes, wear them for just a few hours a day. This prevents injuries on your feet. Wounds, scrapes, corns, and calluses  Check your feet daily for blisters, cuts, bruises, sores, and redness. If you cannot see the bottom of your feet, use a mirror or ask someone for help.  Do not cut corns or calluses or try to remove them with medicine.  If you  find a minor scrape, cut, or break in the skin on your feet, keep it and the skin around it clean and dry. You may clean these areas with mild soap and water. Do not clean the area with peroxide, alcohol, or iodine.  If you have a wound, scrape, corn, or callus on your foot, look at it several times a day to make sure it is healing and not infected. Check for: ? Redness, swelling, or pain. ? Fluid or blood. ? Warmth. ? Pus or a bad smell. General instructions  Do not cross your legs. This may decrease blood flow to your feet.  Do not use heating pads or hot water bottles on your feet. They may burn your skin. If you have lost feeling in your feet or legs, you may not know this is happening until it is too late.  Protect your feet from hot and cold by wearing shoes, such as at the beach or on hot pavement.  Schedule a complete foot exam at least once a year (annually) or more often if you have foot problems. If you have foot problems, report any cuts, sores, or bruises to your health care provider immediately. Contact a health care provider if:  You have a medical condition that increases your risk of infection and you have any cuts, sores, or bruises on your feet.  You have an injury that is not   healing.  You have redness on your legs or feet.  You feel burning or tingling in your legs or feet.  You have pain or cramps in your legs and feet.  Your legs or feet are numb.  Your feet always feel cold.  You have pain around a toenail. Get help right away if:  You have a wound, scrape, corn, or callus on your foot and: ? You have pain, swelling, or redness that gets worse. ? You have fluid or blood coming from the wound, scrape, corn, or callus. ? Your wound, scrape, corn, or callus feels warm to the touch. ? You have pus or a bad smell coming from the wound, scrape, corn, or callus. ? You have a fever. ? You have a red line going up your leg. Summary  Check your feet every day  for cuts, sores, red spots, swelling, and blisters.  Moisturize feet and legs daily.  Wear shoes that fit properly and have enough cushioning.  If you have foot problems, report any cuts, sores, or bruises to your health care provider immediately.  Schedule a complete foot exam at least once a year (annually) or more often if you have foot problems. This information is not intended to replace advice given to you by your health care provider. Make sure you discuss any questions you have with your health care provider. Document Released: 07/04/2000 Document Revised: 08/19/2017 Document Reviewed: 08/08/2016 Elsevier Patient Education  2020 [employer]  

## 2019-02-22 NOTE — Progress Notes (Signed)
[name] - 68 y.o. female MRN [id number]  Date of birth: 1950-08-13  Office Visit Note: Visit Date: 01/11/2019 PCP: Prince Solian, MD Referred by: Prince Solian, MD  Subjective: Chief Complaint  Patient presents with  . Lower Back - Pain  . Left Hip - Pain   HPI:  [name] is a 68 y.o. female who comes in today At the request of Dr. Eunice Blase for lumbar epidural injection.  Patient's had prior MRI in 2018 for chronic low back pain and radicular pain.  She does have moderate multifactorial stenosis at L4-5 with lateral recess narrowing as well.  We will complete left L4 transforaminal injection today for left low back and hip pain.  Depending on relief would look at L5 injection versus updated MRI which I do not think is needed at this point.  She will continue to follow-up with Dr. Eunice Blase.  ROS Otherwise per HPI.  Assessment & Plan: Visit Diagnoses:  1. Lumbar radiculopathy     Plan: No additional findings.   Meds & Orders:  Meds ordered this encounter  Medications  . betamethasone acetate-betamethasone sodium phosphate (CELESTONE) injection 6 mg    Orders Placed This Encounter  Procedures  . XR C-ARM NO REPORT  . Epidural Steroid injection    Follow-up: No follow-ups on file.   Procedures: No procedures performed  Lumbosacral Transforaminal Epidural Steroid Injection - Sub-Pedicular Approach with Fluoroscopic Guidance  Patient: [name]      Date of Birth: 08-25-1950 MRN: [id number] PCP: Prince Solian, MD      Visit Date: 01/11/2019   Universal Protocol:    Date/Time: 01/11/2019  Consent Given By: the patient  Position: PRONE  Additional Comments: Vital signs were monitored before and after the procedure. Patient was prepped and draped in the usual sterile fashion. The correct patient, procedure, and site was verified.   Injection Procedure Details:  Procedure Site One Meds Administered:  Meds ordered this  encounter  Medications  . betamethasone acetate-betamethasone sodium phosphate (CELESTONE) injection 6 mg    Laterality: Left  Location/Site:  L4-L5  Needle size: 22 G  Needle type: Spinal  Needle Placement: Transforaminal  Findings:    -Comments: Excellent flow of contrast along the nerve and into the epidural space.  Procedure Details: After squaring off the end-plates to get a true AP view, the C-arm was positioned so that an oblique view of the foramen as noted above was visualized. The target area is just inferior to the "nose of the scotty dog" or sub pedicular. The soft tissues overlying this structure were infiltrated with 2-3 ml. of 1% Lidocaine without Epinephrine.  The spinal needle was inserted toward the target using a "trajectory" view along the fluoroscope beam.  Under AP and lateral visualization, the needle was advanced so it did not puncture dura and was located close the 6 O'Clock position of the pedical in AP tracterory. Biplanar projections were used to confirm position. Aspiration was confirmed to be negative for CSF and/or blood. A 1-2 ml. volume of Isovue-250 was injected and flow of contrast was noted at each level. Radiographs were obtained for documentation purposes.   After attaining the desired flow of contrast documented above, a 0.5 to 1.0 ml test dose of 0.25% Marcaine was injected into each respective transforaminal space.  The patient was observed for 90 seconds post injection.  After no sensory deficits were reported, and normal lower extremity motor function was noted,   the above injectate  was administered so that equal amounts of the injectate were placed at each foramen (level) into the transforaminal epidural space.   Additional Comments:  The patient tolerated the procedure well Dressing: 2 x 2 sterile gauze and Band-Aid    Post-procedure details: Patient was observed during the procedure. Post-procedure instructions were reviewed.   Patient left the clinic in stable condition.    Clinical History: MRI LUMBAR SPINE WITHOUT CONTRAST  TECHNIQUE: Multiplanar, multisequence MR imaging of the lumbar spine was performed. No intravenous contrast was administered.  COMPARISON:  CT abdomen pelvis 08/28/2016  FINDINGS: Segmentation:  Normal  Alignment:  3 mm anterolisthesis L4-5.  Remaining alignment anatomic  Vertebrae: Negative for fracture or metastatic disease. Normal bone marrow.  Conus medullaris and cauda equina: Conus extends to the L1 level. Conus and cauda equina appear normal.  Paraspinal and other soft tissues: Negative for paraspinous mass or adenopathy. Multiple small renal cysts bilaterally. Relatively small kidneys. Chronic renal failure noted. Negative for hydronephrosis.  Disc levels:  L1-2: Negative  L2-3: Negative  L3-4: Mild disc bulging and mild facet degeneration. Mild subarticular and foraminal stenosis on the left  L4-5: Broad-based central disc protrusion is moderately large. Moderate facet hypertrophy bilaterally. Moderate central canal stenosis. Moderate to severe subarticular stenosis bilaterally with moderate right foraminal encroachment.  L5-S1: Disc degeneration and spondylosis. Diffuse disc bulging with endplate spurring. Bilateral facet hypertrophy. Mild subarticular stenosis bilaterally.  IMPRESSION: Moderate spinal stenosis L4-5 due to central disc protrusion and posterior element hypertrophy. Moderate to severe subarticular stenosis bilaterally with moderate right foraminal encroachment  Mild subarticular stenosis bilaterally L5-S1. Mild subarticular and foraminal stenosis left L3-4.   Electronically Signed   By: Franchot Gallo M.D.   On: 12/15/2018 08:18  ------ MRI CERVICAL SPINE WITHOUT CONTRAST  TECHNIQUE: Multiplanar, multisequence MR imaging of the cervical spine was performed. No intravenous contrast was administered.   COMPARISON: None.  FINDINGS: The examination is severely motion degraded. This decreases sensitivity and specificity.  Alignment: Normal  Vertebrae: No focal marrow abnormality. No compression fracture.  Cord: No obvious abnormality, but assessment of the cord is limited by motion.  Posterior Fossa, vertebral arteries, paraspinal tissues: No focal abnormality  Disc levels:  Assessment of the disc levels is limited by motion. There are small disc herniations at C2-C3, C5-6 and C6-C7 with mild-to-moderate spinal canal stenosis, worst at the C5-6 level  IMPRESSION: 1. Severely motion degraded examination. 2. Suspected moderate spinal canal stenosis at C5-C6 due to disc herniation. 3. Small herniations at C2-3 and C6-7 with probable mild spinal canal stenosis.   Electronically Signed By: Ulyses Jarred M.D. On: 01/23/2017 04:22     Objective:  VS:  HT:    WT:   BMI:     BP:(!) 141/59  HR:88bpm  TEMP:98.3 F (36.8 C)( )  RESP:90 % Physical Exam  Ortho Exam Imaging: No results found.

## 2019-02-23 DIAGNOSIS — D509 Iron deficiency anemia, unspecified: Secondary | ICD-10-CM | POA: Diagnosis not present

## 2019-02-23 DIAGNOSIS — N2581 Secondary hyperparathyroidism of renal origin: Secondary | ICD-10-CM | POA: Diagnosis not present

## 2019-02-23 DIAGNOSIS — R5383 Other fatigue: Secondary | ICD-10-CM | POA: Diagnosis not present

## 2019-02-23 DIAGNOSIS — N186 End stage renal disease: Secondary | ICD-10-CM | POA: Diagnosis not present

## 2019-02-23 DIAGNOSIS — Z992 Dependence on renal dialysis: Secondary | ICD-10-CM | POA: Diagnosis not present

## 2019-02-23 DIAGNOSIS — D631 Anemia in chronic kidney disease: Secondary | ICD-10-CM | POA: Diagnosis not present

## 2019-02-23 NOTE — Progress Notes (Signed)
Subjective: [name] is a 68 y.o. y.o. female who presents for preventative foot care today with h/o diabetes, PAD, ESRD on hemodialysis.  She is seen for at-risk foot care. Her sister is present during the visit.   She has h/o osteomyelitis of the left hallux. She was followed by Dr. March Rummage who has cleared her for her left hallux which has healed.  Her sister notes [name] has had a stent placed in her left LE for her circulation.  Prince Solian, MD is her PCP.    Current Outpatient Medications:  .  acetaminophen (TYLENOL) 500 MG tablet, Take 500 mg by mouth 2 (two) times a day. , Disp: , Rfl:  .  albuterol (PROVENTIL) (2.5 MG/3ML) 0.083% nebulizer solution, Take 3 mLs (2.5 mg total) by nebulization every 6 (six) hours as needed for wheezing or shortness of breath., Disp: 75 mL, Rfl: 12 .  amLODipine (NORVASC) 5 MG tablet, Take 5 mg by mouth daily. , Disp: , Rfl:  .  amoxicillin-clavulanate (AUGMENTIN) 500-125 MG tablet, Take 1 tablet by mouth qd on Tuesday, Thursday and Saturday., Disp: 10 tablet, Rfl: 0 .  aspirin 81 MG chewable tablet, Chew 81 mg by mouth daily. , Disp: , Rfl:  .  atropine 1 % ophthalmic solution, Place 1 drop into the right eye 2 (two) times daily., Disp: , Rfl: 0 .  B Complex-C-Folic Acid (RENA-VITE RX) 1 MG TABS, Take 1 tablet by mouth daily with lunch. , Disp: , Rfl: 1 .  carvedilol (COREG) 25 MG tablet, Take 25 mg by mouth 2 (two) times daily., Disp: , Rfl: 2 .  clopidogrel (PLAVIX) 75 MG tablet, Take 1 tablet (75 mg total) by mouth daily., Disp: 30 tablet, Rfl: 11 .  darbepoetin (ARANESP) 200 MCG/0.4ML SOLN injection, Inject 0.4 mLs (200 mcg total) into the vein every Wednesday with hemodialysis., Disp: 1.68 mL, Rfl:  .  docusate sodium (COLACE) 100 MG capsule, Take 100 mg by mouth daily., Disp: , Rfl:  .  doxercalciferol (HECTOROL) 4 MCG/2ML injection, Inject 0.5 mLs (1 mcg total) into the vein every Monday, Wednesday, and Friday with hemodialysis.,  Disp: 2 mL, Rfl:  .  DULoxetine (CYMBALTA) 30 MG capsule, TK 1 C PO QD, Disp: , Rfl:  .  fluticasone (FLONASE) 50 MCG/ACT nasal spray, SHAKE LQ AND U 2 SPRAYS IEN QD, Disp: , Rfl:  .  gabapentin (NEURONTIN) 100 MG capsule, Take 100 mg by mouth 2 (two) times daily., Disp: , Rfl:  .  insulin aspart (NOVOLOG FLEXPEN) 100 UNIT/ML FlexPen, Inject 6-10 Units into the skin 3 (three) times daily with meals. per Sliding scale, Disp: , Rfl:  .  lanthanum (FOSRENOL) 1000 MG chewable tablet, Chew 2,000 mg by mouth 3 (three) times daily with meals. , Disp: , Rfl:  .  lisinopril (PRINIVIL,ZESTRIL) 40 MG tablet, Take 40 mg by mouth at bedtime. , Disp: , Rfl:  .  omeprazole (PRILOSEC) 40 MG capsule, Take 1 capsule (40 mg total) by mouth daily. (Patient taking differently: Take 40 mg by mouth 2 (two) times daily. ), Disp: , Rfl:  .  ONETOUCH VERIO test strip, 3 (three) times daily. for testing, Disp: , Rfl: 9 .  prednisoLONE acetate (PRED FORTE) 1 % ophthalmic suspension, Place 1 drop into the right eye 2 (two) times daily. , Disp: , Rfl: 0  Current Facility-Administered Medications:  .  0.9 %  sodium chloride infusion, 500 mL, Intravenous, Once, [name], MD .  dexamethasone (DECADRON) injection  4 mg, 4 mg, Intramuscular, Once, Hilts, Michael, MD  Allergies  Allergen Reactions  . Latex Hives  . Oxycodone Other (See Comments)    Hallucinations   . Tramadol Other (See Comments)    hallucinations   . Tape Rash    49M Transpore adhesive tape. Medical tape  . Vicodin [Hydrocodone-Acetaminophen] Other (See Comments)    hallucinations    Objective: Vitals:   02/22/19 1623  Temp: 98.7 F (37.1 C)    Vascular Examination: Capillary refill time delayed x 10 digits  Dorsalis pedis pulses faintly palpable b/l.  Posterior tibial pulses absent b/l.  No digital hair x 10 digits.  Skin temperature WNL b/l.  Dermatological Examination: Skin thin, shiny and atrophic b/l.  Toenails 1-5 b/l  discolored, thick, dystrophic with subungual debris and pain with palpation to nailbeds due to thickness of nails.  Left hallux nail with onycholysis of medial border. No underlying wound noted. Digit remains healed.  Hyperkeratotic lesion noted dorsal PIPJ right 2nd digit and submet head 2 right foot.  Musculoskeletal: Muscle strength 5/5 to all LE muscle groups.  Hammertoes b/l feet.  Neurological: Sensation absent b/l with 10 gram monofilament.  Vibratory sensation absent b/l.  Assessment: 1. Painful onychomycosis toenails 1-5 b/l 2. Callus submet head 2 right foot 3. Corn right 2nd digit PIPJ 4. NIDDM with Peripheral arterial disease 5. ESRD on hemodialysis  Plan: 1. Discuss diabetic foot care principles. Literature dispensed. 2. Toenails 1-5 b/l were debrided in length and girth without iatrogenic bleeding. 3. Hyperkeratotic lesions debrided without incident right submet head 2 and dorsal PIPJ right 2nd digit. 4. Patient to continue soft, supportive shoe gear daily. 5. Patient to report any pedal injuries to medical professional immediately. 6. Follow up 9 weeks.  7. Patient/POA to call should there be a concern in the interim.

## 2019-02-25 DIAGNOSIS — R5383 Other fatigue: Secondary | ICD-10-CM | POA: Diagnosis not present

## 2019-02-25 DIAGNOSIS — N2581 Secondary hyperparathyroidism of renal origin: Secondary | ICD-10-CM | POA: Diagnosis not present

## 2019-02-25 DIAGNOSIS — D509 Iron deficiency anemia, unspecified: Secondary | ICD-10-CM | POA: Diagnosis not present

## 2019-02-25 DIAGNOSIS — N186 End stage renal disease: Secondary | ICD-10-CM | POA: Diagnosis not present

## 2019-02-25 DIAGNOSIS — Z992 Dependence on renal dialysis: Secondary | ICD-10-CM | POA: Diagnosis not present

## 2019-02-25 DIAGNOSIS — D631 Anemia in chronic kidney disease: Secondary | ICD-10-CM | POA: Diagnosis not present

## 2019-02-28 DIAGNOSIS — R5383 Other fatigue: Secondary | ICD-10-CM | POA: Diagnosis not present

## 2019-02-28 DIAGNOSIS — Z992 Dependence on renal dialysis: Secondary | ICD-10-CM | POA: Diagnosis not present

## 2019-02-28 DIAGNOSIS — N186 End stage renal disease: Secondary | ICD-10-CM | POA: Diagnosis not present

## 2019-02-28 DIAGNOSIS — N2581 Secondary hyperparathyroidism of renal origin: Secondary | ICD-10-CM | POA: Diagnosis not present

## 2019-02-28 DIAGNOSIS — D631 Anemia in chronic kidney disease: Secondary | ICD-10-CM | POA: Diagnosis not present

## 2019-02-28 DIAGNOSIS — D509 Iron deficiency anemia, unspecified: Secondary | ICD-10-CM | POA: Diagnosis not present

## 2019-03-02 DIAGNOSIS — R5383 Other fatigue: Secondary | ICD-10-CM | POA: Diagnosis not present

## 2019-03-02 DIAGNOSIS — N2581 Secondary hyperparathyroidism of renal origin: Secondary | ICD-10-CM | POA: Diagnosis not present

## 2019-03-02 DIAGNOSIS — Z992 Dependence on renal dialysis: Secondary | ICD-10-CM | POA: Diagnosis not present

## 2019-03-02 DIAGNOSIS — N186 End stage renal disease: Secondary | ICD-10-CM | POA: Diagnosis not present

## 2019-03-02 DIAGNOSIS — D509 Iron deficiency anemia, unspecified: Secondary | ICD-10-CM | POA: Diagnosis not present

## 2019-03-02 DIAGNOSIS — D631 Anemia in chronic kidney disease: Secondary | ICD-10-CM | POA: Diagnosis not present

## 2019-03-03 DIAGNOSIS — F329 Major depressive disorder, single episode, unspecified: Secondary | ICD-10-CM | POA: Diagnosis not present

## 2019-03-03 DIAGNOSIS — Z1331 Encounter for screening for depression: Secondary | ICD-10-CM | POA: Diagnosis not present

## 2019-03-03 DIAGNOSIS — H540X55 Blindness right eye category 5, blindness left eye category 5: Secondary | ICD-10-CM | POA: Diagnosis not present

## 2019-03-03 DIAGNOSIS — N186 End stage renal disease: Secondary | ICD-10-CM | POA: Diagnosis not present

## 2019-03-03 DIAGNOSIS — K922 Gastrointestinal hemorrhage, unspecified: Secondary | ICD-10-CM | POA: Diagnosis not present

## 2019-03-03 DIAGNOSIS — G609 Hereditary and idiopathic neuropathy, unspecified: Secondary | ICD-10-CM | POA: Diagnosis not present

## 2019-03-03 DIAGNOSIS — E1129 Type 2 diabetes mellitus with other diabetic kidney complication: Secondary | ICD-10-CM | POA: Diagnosis not present

## 2019-03-04 DIAGNOSIS — D631 Anemia in chronic kidney disease: Secondary | ICD-10-CM | POA: Diagnosis not present

## 2019-03-04 DIAGNOSIS — R5383 Other fatigue: Secondary | ICD-10-CM | POA: Diagnosis not present

## 2019-03-04 DIAGNOSIS — Z992 Dependence on renal dialysis: Secondary | ICD-10-CM | POA: Diagnosis not present

## 2019-03-04 DIAGNOSIS — N2581 Secondary hyperparathyroidism of renal origin: Secondary | ICD-10-CM | POA: Diagnosis not present

## 2019-03-04 DIAGNOSIS — D509 Iron deficiency anemia, unspecified: Secondary | ICD-10-CM | POA: Diagnosis not present

## 2019-03-04 DIAGNOSIS — N186 End stage renal disease: Secondary | ICD-10-CM | POA: Diagnosis not present

## 2019-03-07 DIAGNOSIS — N2581 Secondary hyperparathyroidism of renal origin: Secondary | ICD-10-CM | POA: Diagnosis not present

## 2019-03-07 DIAGNOSIS — N186 End stage renal disease: Secondary | ICD-10-CM | POA: Diagnosis not present

## 2019-03-07 DIAGNOSIS — D509 Iron deficiency anemia, unspecified: Secondary | ICD-10-CM | POA: Diagnosis not present

## 2019-03-07 DIAGNOSIS — R5383 Other fatigue: Secondary | ICD-10-CM | POA: Diagnosis not present

## 2019-03-07 DIAGNOSIS — D631 Anemia in chronic kidney disease: Secondary | ICD-10-CM | POA: Diagnosis not present

## 2019-03-07 DIAGNOSIS — Z992 Dependence on renal dialysis: Secondary | ICD-10-CM | POA: Diagnosis not present

## 2019-03-09 DIAGNOSIS — N2581 Secondary hyperparathyroidism of renal origin: Secondary | ICD-10-CM | POA: Diagnosis not present

## 2019-03-09 DIAGNOSIS — N186 End stage renal disease: Secondary | ICD-10-CM | POA: Diagnosis not present

## 2019-03-09 DIAGNOSIS — R5383 Other fatigue: Secondary | ICD-10-CM | POA: Diagnosis not present

## 2019-03-09 DIAGNOSIS — Z992 Dependence on renal dialysis: Secondary | ICD-10-CM | POA: Diagnosis not present

## 2019-03-09 DIAGNOSIS — D631 Anemia in chronic kidney disease: Secondary | ICD-10-CM | POA: Diagnosis not present

## 2019-03-09 DIAGNOSIS — D509 Iron deficiency anemia, unspecified: Secondary | ICD-10-CM | POA: Diagnosis not present

## 2019-03-10 DIAGNOSIS — G5601 Carpal tunnel syndrome, right upper limb: Secondary | ICD-10-CM | POA: Diagnosis not present

## 2019-03-11 DIAGNOSIS — N2581 Secondary hyperparathyroidism of renal origin: Secondary | ICD-10-CM | POA: Diagnosis not present

## 2019-03-11 DIAGNOSIS — N186 End stage renal disease: Secondary | ICD-10-CM | POA: Diagnosis not present

## 2019-03-11 DIAGNOSIS — Z992 Dependence on renal dialysis: Secondary | ICD-10-CM | POA: Diagnosis not present

## 2019-03-11 DIAGNOSIS — D509 Iron deficiency anemia, unspecified: Secondary | ICD-10-CM | POA: Diagnosis not present

## 2019-03-11 DIAGNOSIS — D631 Anemia in chronic kidney disease: Secondary | ICD-10-CM | POA: Diagnosis not present

## 2019-03-11 DIAGNOSIS — R5383 Other fatigue: Secondary | ICD-10-CM | POA: Diagnosis not present

## 2019-03-14 DIAGNOSIS — R5383 Other fatigue: Secondary | ICD-10-CM | POA: Diagnosis not present

## 2019-03-14 DIAGNOSIS — N2581 Secondary hyperparathyroidism of renal origin: Secondary | ICD-10-CM | POA: Diagnosis not present

## 2019-03-14 DIAGNOSIS — D631 Anemia in chronic kidney disease: Secondary | ICD-10-CM | POA: Diagnosis not present

## 2019-03-14 DIAGNOSIS — N186 End stage renal disease: Secondary | ICD-10-CM | POA: Diagnosis not present

## 2019-03-14 DIAGNOSIS — Z992 Dependence on renal dialysis: Secondary | ICD-10-CM | POA: Diagnosis not present

## 2019-03-14 DIAGNOSIS — D509 Iron deficiency anemia, unspecified: Secondary | ICD-10-CM | POA: Diagnosis not present

## 2019-03-16 DIAGNOSIS — Z992 Dependence on renal dialysis: Secondary | ICD-10-CM | POA: Diagnosis not present

## 2019-03-16 DIAGNOSIS — D509 Iron deficiency anemia, unspecified: Secondary | ICD-10-CM | POA: Diagnosis not present

## 2019-03-16 DIAGNOSIS — N186 End stage renal disease: Secondary | ICD-10-CM | POA: Diagnosis not present

## 2019-03-16 DIAGNOSIS — N2581 Secondary hyperparathyroidism of renal origin: Secondary | ICD-10-CM | POA: Diagnosis not present

## 2019-03-16 DIAGNOSIS — D631 Anemia in chronic kidney disease: Secondary | ICD-10-CM | POA: Diagnosis not present

## 2019-03-16 DIAGNOSIS — R5383 Other fatigue: Secondary | ICD-10-CM | POA: Diagnosis not present

## 2019-03-17 ENCOUNTER — Other Ambulatory Visit: Payer: Self-pay

## 2019-03-17 ENCOUNTER — Ambulatory Visit (INDEPENDENT_AMBULATORY_CARE_PROVIDER_SITE_OTHER): Payer: Medicare Other | Admitting: Podiatry

## 2019-03-17 DIAGNOSIS — M2012 Hallux valgus (acquired), left foot: Secondary | ICD-10-CM

## 2019-03-17 DIAGNOSIS — B351 Tinea unguium: Secondary | ICD-10-CM

## 2019-03-17 NOTE — Progress Notes (Signed)
Subjective:  Patient ID: [name], female    DOB: 12-04-1950,  MRN: [id number]  Chief Complaint  Patient presents with  . Nail Problem    possible infected toes, left great toe, as well as the left second toe, pt states that they have been going on for about 2-3 months, pt states that it will usually hurt all of the toenails, but mostly the left first and second toenails    68 y.o. female presents  for diabetic foot care. Hx as above. Review of Systems: Negative except as noted in the HPI. Denies N/V/F/Ch.  Past Medical History:  Diagnosis Date  . Anemia   . Anxiety   . Arthritis    "joints" (06/15/2013)  . Asthma   . 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 Blood transfusion without reported diagnosis   . Breast cancer ([place])    left  . CHF (congestive heart failure) ([place])   . 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 Esophageal ulcer with bleeding   . ESRD (end stage renal disease) ([place])    "suppose to start dialysis today" (06/15/2013)  . GERD (gastroesophageal reflux disease)   . Glaucoma    blind in both eyes  . Heart murmur   . Hx of adenomatous colonic polyps 04/07/2018  . Hypertension   . Myalgia 12/31/2011  . Neuropathy 12/31/2011  . Shortness of breath    "when she doesn't go to dialysis"  . Type II diabetes mellitus ([facility])     Current Outpatient Medications:  .  acetaminophen (TYLENOL) 500 MG tablet, Take 500 mg by mouth 2 (two) times a day. , Disp: , Rfl:  .  albuterol (PROVENTIL) (2.5 MG/3ML) 0.083% nebulizer solution, Take 3 mLs (2.5 mg total) by nebulization every 6 (six) hours as needed for wheezing or shortness of breath., Disp: 75 mL, Rfl: 12 .  Alcohol Swabs (B-D SINGLE USE SWABS REGULAR) PADS, USE UTD FOR INSULIN INJECTIONS TID, Disp: , Rfl:  .  amLODipine (NORVASC) 5 MG tablet, Take 5 mg by mouth daily. , Disp: , Rfl:  .  amoxicillin-clavulanate (AUGMENTIN) 500-125 MG tablet, Take 1 tablet by mouth qd on Tuesday, Thursday and  Saturday., Disp: 10 tablet, Rfl: 0 .  aspirin 81 MG chewable tablet, Chew 81 mg by mouth daily. , Disp: , Rfl:  .  atropine 1 % ophthalmic solution, Place 1 drop into the right eye 2 (two) times daily., Disp: , Rfl: 0 .  B Complex-C-Folic Acid (RENA-VITE RX) 1 MG TABS, Take 1 tablet by mouth daily with lunch. , Disp: , Rfl: 1 .  BD PEN NEEDLE NANO 2ND GEN 32G X 4 MM MISC, USE WITH INSULIN PEN 3 TIMES A DAY, Disp: , Rfl:  .  carvedilol (COREG) 25 MG tablet, Take 25 mg by mouth 2 (two) times daily., Disp: , Rfl: 2 .  clopidogrel (PLAVIX) 75 MG tablet, Take 1 tablet (75 mg total) by mouth daily., Disp: 30 tablet, Rfl: 11 .  darbepoetin (ARANESP) 200 MCG/0.4ML SOLN injection, Inject 0.4 mLs (200 mcg total) into the vein every Wednesday with hemodialysis., Disp: 1.68 mL, Rfl:  .  docusate sodium (COLACE) 100 MG capsule, Take 100 mg by mouth daily., Disp: , Rfl:  .  doxercalciferol (HECTOROL) 4 MCG/2ML injection, Inject 0.5 mLs (1 mcg total) into the vein every Monday, Wednesday, and Friday with hemodialysis., Disp: 2 mL, Rfl:  .  DULoxetine (CYMBALTA) 30 MG capsule, TK 1 C PO  QD, Disp: , Rfl:  .  fluticasone (FLONASE) 50 MCG/ACT nasal spray, SHAKE LQ AND U 2 SPRAYS IEN QD, Disp: , Rfl:  .  gabapentin (NEURONTIN) 100 MG capsule, Take 100 mg by mouth 2 (two) times daily., Disp: , Rfl:  .  insulin aspart (NOVOLOG FLEXPEN) 100 UNIT/ML FlexPen, Inject 6-10 Units into the skin 3 (three) times daily with meals. per Sliding scale, Disp: , Rfl:  .  lanthanum (FOSRENOL) 1000 MG chewable tablet, Chew 2,000 mg by mouth 3 (three) times daily with meals. , Disp: , Rfl:  .  lisinopril (PRINIVIL,ZESTRIL) 40 MG tablet, Take 40 mg by mouth at bedtime. , Disp: , Rfl:  .  omeprazole (PRILOSEC) 40 MG capsule, Take 1 capsule (40 mg total) by mouth daily. (Patient taking differently: Take 40 mg by mouth 2 (two) times daily. ), Disp: , Rfl:  .  ONETOUCH VERIO test strip, 3 (three) times daily. for testing, Disp: , Rfl: 9 .   prednisoLONE acetate (PRED FORTE) 1 % ophthalmic suspension, Place 1 drop into the right eye 2 (two) times daily. , Disp: , Rfl: 0 .  traMADol (ULTRAM) 50 MG tablet, Take by mouth., Disp: , Rfl:   Current Facility-Administered Medications:  .  0.9 %  sodium chloride infusion, 500 mL, Intravenous, Once, [name], MD .  dexamethasone (DECADRON) injection 4 mg, 4 mg, Intramuscular, Once, Hilts, Phillipa Morden, MD  Social History   Tobacco Use  Smoking Status Never Smoker  Smokeless Tobacco Never Used    Allergies  Allergen Reactions  . Latex Hives  . Oxycodone Other (See Comments)    Hallucinations   . Tramadol Other (See Comments)    hallucinations   . Tape Rash    93M Transpore adhesive tape. Medical tape  . Vicodin [Hydrocodone-Acetaminophen] Other (See Comments)    hallucinations   Objective:  There were no vitals filed for this visit. There is no height or weight on file to calculate BMI. Constitutional Well developed. Well nourished.  Vascular Dorsalis pedis pulses absent bilaterally  Posterior tibial pulses absent bilaterally  Pedal hair growth absent. Capillary refill normal to all digits.  No cyanosis or clubbing noted.  Neurologic Normal speech. Oriented to person, place, and time. Epicritic sensation to light touch grossly present bilaterally. Protective sensation absent  Dermatologic Nails elongated, thickened. Left 4th toe ingrowing medially/laterally. Partial lysis distally. Left 2nd toenail thickened. No open wounds. No skin lesions.  Orthopedic: Normal joint ROM without pain or crepitus bilaterally. No visible deformities.Hallux valgus bilat. Hammerteos bilat.    Assessment:   1. Acquired hallux valgus of left foot   2. Onychomycosis    Plan:  Patient was evaluated and treated and all questions answered.  Diabetes with PAD, Onychomycosis -Nails courtesy debrided. Left 4th toenail with partial lysis, slight ingrown nail. Debrided in slant back  fashion. Silver nitrate applied for hemostasis. Abx ointment and band-aid applied. Pt to apply for 1 week. -No signs of infection to any of her digits.  Hallux Valgus L -Toe spacer dispensed to prevent rubbing 1st and 2nd toes left foot.  No follow-ups on file.

## 2019-03-18 DIAGNOSIS — Z992 Dependence on renal dialysis: Secondary | ICD-10-CM | POA: Diagnosis not present

## 2019-03-18 DIAGNOSIS — N186 End stage renal disease: Secondary | ICD-10-CM | POA: Diagnosis not present

## 2019-03-18 DIAGNOSIS — N2581 Secondary hyperparathyroidism of renal origin: Secondary | ICD-10-CM | POA: Diagnosis not present

## 2019-03-18 DIAGNOSIS — D509 Iron deficiency anemia, unspecified: Secondary | ICD-10-CM | POA: Diagnosis not present

## 2019-03-18 DIAGNOSIS — R5383 Other fatigue: Secondary | ICD-10-CM | POA: Diagnosis not present

## 2019-03-18 DIAGNOSIS — D631 Anemia in chronic kidney disease: Secondary | ICD-10-CM | POA: Diagnosis not present

## 2019-03-21 ENCOUNTER — Encounter: Payer: Self-pay | Admitting: Internal Medicine

## 2019-03-21 DIAGNOSIS — Z992 Dependence on renal dialysis: Secondary | ICD-10-CM | POA: Diagnosis not present

## 2019-03-21 DIAGNOSIS — N2581 Secondary hyperparathyroidism of renal origin: Secondary | ICD-10-CM | POA: Diagnosis not present

## 2019-03-21 DIAGNOSIS — D631 Anemia in chronic kidney disease: Secondary | ICD-10-CM | POA: Diagnosis not present

## 2019-03-21 DIAGNOSIS — N186 End stage renal disease: Secondary | ICD-10-CM | POA: Diagnosis not present

## 2019-03-21 DIAGNOSIS — R5383 Other fatigue: Secondary | ICD-10-CM | POA: Diagnosis not present

## 2019-03-21 DIAGNOSIS — D509 Iron deficiency anemia, unspecified: Secondary | ICD-10-CM | POA: Diagnosis not present

## 2019-03-22 DIAGNOSIS — N186 End stage renal disease: Secondary | ICD-10-CM | POA: Diagnosis not present

## 2019-03-22 DIAGNOSIS — E1129 Type 2 diabetes mellitus with other diabetic kidney complication: Secondary | ICD-10-CM | POA: Diagnosis not present

## 2019-03-22 DIAGNOSIS — Z992 Dependence on renal dialysis: Secondary | ICD-10-CM | POA: Diagnosis not present

## 2019-03-23 DIAGNOSIS — N186 End stage renal disease: Secondary | ICD-10-CM | POA: Diagnosis not present

## 2019-03-23 DIAGNOSIS — D509 Iron deficiency anemia, unspecified: Secondary | ICD-10-CM | POA: Diagnosis not present

## 2019-03-23 DIAGNOSIS — Z992 Dependence on renal dialysis: Secondary | ICD-10-CM | POA: Diagnosis not present

## 2019-03-23 DIAGNOSIS — Z23 Encounter for immunization: Secondary | ICD-10-CM | POA: Diagnosis not present

## 2019-03-23 DIAGNOSIS — N2581 Secondary hyperparathyroidism of renal origin: Secondary | ICD-10-CM | POA: Diagnosis not present

## 2019-03-23 DIAGNOSIS — D631 Anemia in chronic kidney disease: Secondary | ICD-10-CM | POA: Diagnosis not present

## 2019-03-25 DIAGNOSIS — Z23 Encounter for immunization: Secondary | ICD-10-CM | POA: Diagnosis not present

## 2019-03-25 DIAGNOSIS — D631 Anemia in chronic kidney disease: Secondary | ICD-10-CM | POA: Diagnosis not present

## 2019-03-25 DIAGNOSIS — N186 End stage renal disease: Secondary | ICD-10-CM | POA: Diagnosis not present

## 2019-03-25 DIAGNOSIS — Z992 Dependence on renal dialysis: Secondary | ICD-10-CM | POA: Diagnosis not present

## 2019-03-25 DIAGNOSIS — N2581 Secondary hyperparathyroidism of renal origin: Secondary | ICD-10-CM | POA: Diagnosis not present

## 2019-03-25 DIAGNOSIS — D509 Iron deficiency anemia, unspecified: Secondary | ICD-10-CM | POA: Diagnosis not present

## 2019-03-28 DIAGNOSIS — D631 Anemia in chronic kidney disease: Secondary | ICD-10-CM | POA: Diagnosis not present

## 2019-03-28 DIAGNOSIS — Z23 Encounter for immunization: Secondary | ICD-10-CM | POA: Diagnosis not present

## 2019-03-28 DIAGNOSIS — D509 Iron deficiency anemia, unspecified: Secondary | ICD-10-CM | POA: Diagnosis not present

## 2019-03-28 DIAGNOSIS — N186 End stage renal disease: Secondary | ICD-10-CM | POA: Diagnosis not present

## 2019-03-28 DIAGNOSIS — Z992 Dependence on renal dialysis: Secondary | ICD-10-CM | POA: Diagnosis not present

## 2019-03-28 DIAGNOSIS — N2581 Secondary hyperparathyroidism of renal origin: Secondary | ICD-10-CM | POA: Diagnosis not present

## 2019-03-30 DIAGNOSIS — N2581 Secondary hyperparathyroidism of renal origin: Secondary | ICD-10-CM | POA: Diagnosis not present

## 2019-03-30 DIAGNOSIS — D631 Anemia in chronic kidney disease: Secondary | ICD-10-CM | POA: Diagnosis not present

## 2019-03-30 DIAGNOSIS — N186 End stage renal disease: Secondary | ICD-10-CM | POA: Diagnosis not present

## 2019-03-30 DIAGNOSIS — Z23 Encounter for immunization: Secondary | ICD-10-CM | POA: Diagnosis not present

## 2019-03-30 DIAGNOSIS — D509 Iron deficiency anemia, unspecified: Secondary | ICD-10-CM | POA: Diagnosis not present

## 2019-03-30 DIAGNOSIS — Z992 Dependence on renal dialysis: Secondary | ICD-10-CM | POA: Diagnosis not present

## 2019-04-01 DIAGNOSIS — N186 End stage renal disease: Secondary | ICD-10-CM | POA: Diagnosis not present

## 2019-04-01 DIAGNOSIS — D509 Iron deficiency anemia, unspecified: Secondary | ICD-10-CM | POA: Diagnosis not present

## 2019-04-01 DIAGNOSIS — Z23 Encounter for immunization: Secondary | ICD-10-CM | POA: Diagnosis not present

## 2019-04-01 DIAGNOSIS — D631 Anemia in chronic kidney disease: Secondary | ICD-10-CM | POA: Diagnosis not present

## 2019-04-01 DIAGNOSIS — Z992 Dependence on renal dialysis: Secondary | ICD-10-CM | POA: Diagnosis not present

## 2019-04-01 DIAGNOSIS — N2581 Secondary hyperparathyroidism of renal origin: Secondary | ICD-10-CM | POA: Diagnosis not present

## 2019-04-04 DIAGNOSIS — Z23 Encounter for immunization: Secondary | ICD-10-CM | POA: Diagnosis not present

## 2019-04-04 DIAGNOSIS — N186 End stage renal disease: Secondary | ICD-10-CM | POA: Diagnosis not present

## 2019-04-04 DIAGNOSIS — N2581 Secondary hyperparathyroidism of renal origin: Secondary | ICD-10-CM | POA: Diagnosis not present

## 2019-04-04 DIAGNOSIS — D631 Anemia in chronic kidney disease: Secondary | ICD-10-CM | POA: Diagnosis not present

## 2019-04-04 DIAGNOSIS — Z992 Dependence on renal dialysis: Secondary | ICD-10-CM | POA: Diagnosis not present

## 2019-04-04 DIAGNOSIS — D509 Iron deficiency anemia, unspecified: Secondary | ICD-10-CM | POA: Diagnosis not present

## 2019-04-06 DIAGNOSIS — N2581 Secondary hyperparathyroidism of renal origin: Secondary | ICD-10-CM | POA: Diagnosis not present

## 2019-04-06 DIAGNOSIS — Z992 Dependence on renal dialysis: Secondary | ICD-10-CM | POA: Diagnosis not present

## 2019-04-06 DIAGNOSIS — D509 Iron deficiency anemia, unspecified: Secondary | ICD-10-CM | POA: Diagnosis not present

## 2019-04-06 DIAGNOSIS — Z23 Encounter for immunization: Secondary | ICD-10-CM | POA: Diagnosis not present

## 2019-04-06 DIAGNOSIS — N186 End stage renal disease: Secondary | ICD-10-CM | POA: Diagnosis not present

## 2019-04-06 DIAGNOSIS — D631 Anemia in chronic kidney disease: Secondary | ICD-10-CM | POA: Diagnosis not present

## 2019-04-08 DIAGNOSIS — D631 Anemia in chronic kidney disease: Secondary | ICD-10-CM | POA: Diagnosis not present

## 2019-04-08 DIAGNOSIS — Z992 Dependence on renal dialysis: Secondary | ICD-10-CM | POA: Diagnosis not present

## 2019-04-08 DIAGNOSIS — N186 End stage renal disease: Secondary | ICD-10-CM | POA: Diagnosis not present

## 2019-04-08 DIAGNOSIS — N2581 Secondary hyperparathyroidism of renal origin: Secondary | ICD-10-CM | POA: Diagnosis not present

## 2019-04-08 DIAGNOSIS — D509 Iron deficiency anemia, unspecified: Secondary | ICD-10-CM | POA: Diagnosis not present

## 2019-04-08 DIAGNOSIS — Z23 Encounter for immunization: Secondary | ICD-10-CM | POA: Diagnosis not present

## 2019-04-11 DIAGNOSIS — D509 Iron deficiency anemia, unspecified: Secondary | ICD-10-CM | POA: Diagnosis not present

## 2019-04-11 DIAGNOSIS — Z992 Dependence on renal dialysis: Secondary | ICD-10-CM | POA: Diagnosis not present

## 2019-04-11 DIAGNOSIS — D631 Anemia in chronic kidney disease: Secondary | ICD-10-CM | POA: Diagnosis not present

## 2019-04-11 DIAGNOSIS — Z23 Encounter for immunization: Secondary | ICD-10-CM | POA: Diagnosis not present

## 2019-04-11 DIAGNOSIS — N186 End stage renal disease: Secondary | ICD-10-CM | POA: Diagnosis not present

## 2019-04-11 DIAGNOSIS — N2581 Secondary hyperparathyroidism of renal origin: Secondary | ICD-10-CM | POA: Diagnosis not present

## 2019-04-13 DIAGNOSIS — Z23 Encounter for immunization: Secondary | ICD-10-CM | POA: Diagnosis not present

## 2019-04-13 DIAGNOSIS — N186 End stage renal disease: Secondary | ICD-10-CM | POA: Diagnosis not present

## 2019-04-13 DIAGNOSIS — D631 Anemia in chronic kidney disease: Secondary | ICD-10-CM | POA: Diagnosis not present

## 2019-04-13 DIAGNOSIS — N2581 Secondary hyperparathyroidism of renal origin: Secondary | ICD-10-CM | POA: Diagnosis not present

## 2019-04-13 DIAGNOSIS — Z992 Dependence on renal dialysis: Secondary | ICD-10-CM | POA: Diagnosis not present

## 2019-04-13 DIAGNOSIS — D509 Iron deficiency anemia, unspecified: Secondary | ICD-10-CM | POA: Diagnosis not present

## 2019-04-15 DIAGNOSIS — N186 End stage renal disease: Secondary | ICD-10-CM | POA: Diagnosis not present

## 2019-04-15 DIAGNOSIS — N2581 Secondary hyperparathyroidism of renal origin: Secondary | ICD-10-CM | POA: Diagnosis not present

## 2019-04-15 DIAGNOSIS — D509 Iron deficiency anemia, unspecified: Secondary | ICD-10-CM | POA: Diagnosis not present

## 2019-04-15 DIAGNOSIS — Z23 Encounter for immunization: Secondary | ICD-10-CM | POA: Diagnosis not present

## 2019-04-15 DIAGNOSIS — Z992 Dependence on renal dialysis: Secondary | ICD-10-CM | POA: Diagnosis not present

## 2019-04-15 DIAGNOSIS — D631 Anemia in chronic kidney disease: Secondary | ICD-10-CM | POA: Diagnosis not present

## 2019-04-18 DIAGNOSIS — Z23 Encounter for immunization: Secondary | ICD-10-CM | POA: Diagnosis not present

## 2019-04-18 DIAGNOSIS — N2581 Secondary hyperparathyroidism of renal origin: Secondary | ICD-10-CM | POA: Diagnosis not present

## 2019-04-18 DIAGNOSIS — Z992 Dependence on renal dialysis: Secondary | ICD-10-CM | POA: Diagnosis not present

## 2019-04-18 DIAGNOSIS — D631 Anemia in chronic kidney disease: Secondary | ICD-10-CM | POA: Diagnosis not present

## 2019-04-18 DIAGNOSIS — D509 Iron deficiency anemia, unspecified: Secondary | ICD-10-CM | POA: Diagnosis not present

## 2019-04-18 DIAGNOSIS — N186 End stage renal disease: Secondary | ICD-10-CM | POA: Diagnosis not present

## 2019-04-20 DIAGNOSIS — N186 End stage renal disease: Secondary | ICD-10-CM | POA: Diagnosis not present

## 2019-04-20 DIAGNOSIS — D631 Anemia in chronic kidney disease: Secondary | ICD-10-CM | POA: Diagnosis not present

## 2019-04-20 DIAGNOSIS — D509 Iron deficiency anemia, unspecified: Secondary | ICD-10-CM | POA: Diagnosis not present

## 2019-04-20 DIAGNOSIS — Z23 Encounter for immunization: Secondary | ICD-10-CM | POA: Diagnosis not present

## 2019-04-20 DIAGNOSIS — N2581 Secondary hyperparathyroidism of renal origin: Secondary | ICD-10-CM | POA: Diagnosis not present

## 2019-04-20 DIAGNOSIS — Z992 Dependence on renal dialysis: Secondary | ICD-10-CM | POA: Diagnosis not present

## 2019-04-21 DIAGNOSIS — N186 End stage renal disease: Secondary | ICD-10-CM | POA: Diagnosis not present

## 2019-04-21 DIAGNOSIS — Z992 Dependence on renal dialysis: Secondary | ICD-10-CM | POA: Diagnosis not present

## 2019-04-21 DIAGNOSIS — E1129 Type 2 diabetes mellitus with other diabetic kidney complication: Secondary | ICD-10-CM | POA: Diagnosis not present

## 2019-04-22 DIAGNOSIS — Z992 Dependence on renal dialysis: Secondary | ICD-10-CM | POA: Diagnosis not present

## 2019-04-22 DIAGNOSIS — N186 End stage renal disease: Secondary | ICD-10-CM | POA: Diagnosis not present

## 2019-04-22 DIAGNOSIS — D509 Iron deficiency anemia, unspecified: Secondary | ICD-10-CM | POA: Diagnosis not present

## 2019-04-22 DIAGNOSIS — D631 Anemia in chronic kidney disease: Secondary | ICD-10-CM | POA: Diagnosis not present

## 2019-04-22 DIAGNOSIS — N2581 Secondary hyperparathyroidism of renal origin: Secondary | ICD-10-CM | POA: Diagnosis not present

## 2019-04-25 DIAGNOSIS — N2581 Secondary hyperparathyroidism of renal origin: Secondary | ICD-10-CM | POA: Diagnosis not present

## 2019-04-25 DIAGNOSIS — D509 Iron deficiency anemia, unspecified: Secondary | ICD-10-CM | POA: Diagnosis not present

## 2019-04-25 DIAGNOSIS — N186 End stage renal disease: Secondary | ICD-10-CM | POA: Diagnosis not present

## 2019-04-25 DIAGNOSIS — Z992 Dependence on renal dialysis: Secondary | ICD-10-CM | POA: Diagnosis not present

## 2019-04-25 DIAGNOSIS — D631 Anemia in chronic kidney disease: Secondary | ICD-10-CM | POA: Diagnosis not present

## 2019-04-27 DIAGNOSIS — Z992 Dependence on renal dialysis: Secondary | ICD-10-CM | POA: Diagnosis not present

## 2019-04-27 DIAGNOSIS — E1142 Type 2 diabetes mellitus with diabetic polyneuropathy: Secondary | ICD-10-CM | POA: Diagnosis not present

## 2019-04-27 DIAGNOSIS — N186 End stage renal disease: Secondary | ICD-10-CM | POA: Diagnosis not present

## 2019-04-27 DIAGNOSIS — N2581 Secondary hyperparathyroidism of renal origin: Secondary | ICD-10-CM | POA: Diagnosis not present

## 2019-04-27 DIAGNOSIS — D509 Iron deficiency anemia, unspecified: Secondary | ICD-10-CM | POA: Diagnosis not present

## 2019-04-27 DIAGNOSIS — D631 Anemia in chronic kidney disease: Secondary | ICD-10-CM | POA: Diagnosis not present

## 2019-04-29 DIAGNOSIS — D509 Iron deficiency anemia, unspecified: Secondary | ICD-10-CM | POA: Diagnosis not present

## 2019-04-29 DIAGNOSIS — Z992 Dependence on renal dialysis: Secondary | ICD-10-CM | POA: Diagnosis not present

## 2019-04-29 DIAGNOSIS — N186 End stage renal disease: Secondary | ICD-10-CM | POA: Diagnosis not present

## 2019-04-29 DIAGNOSIS — N2581 Secondary hyperparathyroidism of renal origin: Secondary | ICD-10-CM | POA: Diagnosis not present

## 2019-04-29 DIAGNOSIS — D631 Anemia in chronic kidney disease: Secondary | ICD-10-CM | POA: Diagnosis not present

## 2019-05-02 DIAGNOSIS — Z992 Dependence on renal dialysis: Secondary | ICD-10-CM | POA: Diagnosis not present

## 2019-05-02 DIAGNOSIS — D631 Anemia in chronic kidney disease: Secondary | ICD-10-CM | POA: Diagnosis not present

## 2019-05-02 DIAGNOSIS — N186 End stage renal disease: Secondary | ICD-10-CM | POA: Diagnosis not present

## 2019-05-02 DIAGNOSIS — N2581 Secondary hyperparathyroidism of renal origin: Secondary | ICD-10-CM | POA: Diagnosis not present

## 2019-05-02 DIAGNOSIS — D509 Iron deficiency anemia, unspecified: Secondary | ICD-10-CM | POA: Diagnosis not present

## 2019-05-03 ENCOUNTER — Ambulatory Visit (INDEPENDENT_AMBULATORY_CARE_PROVIDER_SITE_OTHER): Payer: Medicare Other | Admitting: Podiatry

## 2019-05-03 ENCOUNTER — Other Ambulatory Visit: Payer: Self-pay

## 2019-05-03 ENCOUNTER — Encounter: Payer: Self-pay | Admitting: Podiatry

## 2019-05-03 DIAGNOSIS — B351 Tinea unguium: Secondary | ICD-10-CM

## 2019-05-03 DIAGNOSIS — E1151 Type 2 diabetes mellitus with diabetic peripheral angiopathy without gangrene: Secondary | ICD-10-CM | POA: Diagnosis not present

## 2019-05-03 DIAGNOSIS — L600 Ingrowing nail: Secondary | ICD-10-CM

## 2019-05-03 DIAGNOSIS — L84 Corns and callosities: Secondary | ICD-10-CM | POA: Diagnosis not present

## 2019-05-03 DIAGNOSIS — S91215A Laceration without foreign body of left lesser toe(s) with damage to nail, initial encounter: Secondary | ICD-10-CM

## 2019-05-03 MED ORDER — MUPIROCIN 2 % EX OINT: TOPICAL_OINTMENT | CUTANEOUS | 0 refills | Status: AC

## 2019-05-03 NOTE — Patient Instructions (Signed)
Diabetes Mellitus and Foot Care Foot care is an important part of your health, especially when you have diabetes. Diabetes may cause you to have problems because of poor blood flow (circulation) to your feet and legs, which can cause your skin to:  Become thinner and drier.  Break more easily.  Heal more slowly.  Peel and crack. You may also have nerve damage (neuropathy) in your legs and feet, causing decreased feeling in them. This means that you may not notice minor injuries to your feet that could lead to more serious problems. Noticing and addressing any potential problems early is the best way to prevent future foot problems. How to care for your feet Foot hygiene  Wash your feet daily with warm water and mild soap. Do not use hot water. Then, pat your feet and the areas between your toes until they are completely dry. Do not soak your feet as this can dry your skin.  Trim your toenails straight across. Do not dig under them or around the cuticle. File the edges of your nails with an emery board or nail file.  Apply a moisturizing lotion or petroleum jelly to the skin on your feet and to dry, brittle toenails. Use lotion that does not contain alcohol and is unscented. Do not apply lotion between your toes. Shoes and socks  Wear clean socks or stockings every day. Make sure they are not too tight. Do not wear knee-high stockings since they may decrease blood flow to your legs.  Wear shoes that fit properly and have enough cushioning. Always look in your shoes before you put them on to be sure there are no objects inside.  To break in new shoes, wear them for just a few hours a day. This prevents injuries on your feet. Wounds, scrapes, corns, and calluses  Check your feet daily for blisters, cuts, bruises, sores, and redness. If you cannot see the bottom of your feet, use a mirror or ask someone for help.  Do not cut corns or calluses or try to remove them with medicine.  If you  find a minor scrape, cut, or break in the skin on your feet, keep it and the skin around it clean and dry. You may clean these areas with mild soap and water. Do not clean the area with peroxide, alcohol, or iodine.  If you have a wound, scrape, corn, or callus on your foot, look at it several times a day to make sure it is healing and not infected. Check for: ? Redness, swelling, or pain. ? Fluid or blood. ? Warmth. ? Pus or a bad smell. General instructions  Do not cross your legs. This may decrease blood flow to your feet.  Do not use heating pads or hot water bottles on your feet. They may burn your skin. If you have lost feeling in your feet or legs, you may not know this is happening until it is too late.  Protect your feet from hot and cold by wearing shoes, such as at the beach or on hot pavement.  Schedule a complete foot exam at least once a year (annually) or more often if you have foot problems. If you have foot problems, report any cuts, sores, or bruises to your health care provider immediately. Contact a health care provider if:  You have a medical condition that increases your risk of infection and you have any cuts, sores, or bruises on your feet.  You have an injury that is not   healing.  You have redness on your legs or feet.  You feel burning or tingling in your legs or feet.  You have pain or cramps in your legs and feet.  Your legs or feet are numb.  Your feet always feel cold.  You have pain around a toenail. Get help right away if:  You have a wound, scrape, corn, or callus on your foot and: ? You have pain, swelling, or redness that gets worse. ? You have fluid or blood coming from the wound, scrape, corn, or callus. ? Your wound, scrape, corn, or callus feels warm to the touch. ? You have pus or a bad smell coming from the wound, scrape, corn, or callus. ? You have a fever. ? You have a red line going up your leg. Summary  Check your feet every day  for cuts, sores, red spots, swelling, and blisters.  Moisturize feet and legs daily.  Wear shoes that fit properly and have enough cushioning.  If you have foot problems, report any cuts, sores, or bruises to your health care provider immediately.  Schedule a complete foot exam at least once a year (annually) or more often if you have foot problems. This information is not intended to replace advice given to you by your health care provider. Make sure you discuss any questions you have with your health care provider. Document Released: 07/04/2000 Document Revised: 08/19/2017 Document Reviewed: 08/08/2016 Elsevier Patient Education  2020 [employer]  

## 2019-05-04 DIAGNOSIS — N2581 Secondary hyperparathyroidism of renal origin: Secondary | ICD-10-CM | POA: Diagnosis not present

## 2019-05-04 DIAGNOSIS — N186 End stage renal disease: Secondary | ICD-10-CM | POA: Diagnosis not present

## 2019-05-04 DIAGNOSIS — D631 Anemia in chronic kidney disease: Secondary | ICD-10-CM | POA: Diagnosis not present

## 2019-05-04 DIAGNOSIS — Z992 Dependence on renal dialysis: Secondary | ICD-10-CM | POA: Diagnosis not present

## 2019-05-04 DIAGNOSIS — D509 Iron deficiency anemia, unspecified: Secondary | ICD-10-CM | POA: Diagnosis not present

## 2019-05-06 DIAGNOSIS — N2581 Secondary hyperparathyroidism of renal origin: Secondary | ICD-10-CM | POA: Diagnosis not present

## 2019-05-06 DIAGNOSIS — D509 Iron deficiency anemia, unspecified: Secondary | ICD-10-CM | POA: Diagnosis not present

## 2019-05-06 DIAGNOSIS — N186 End stage renal disease: Secondary | ICD-10-CM | POA: Diagnosis not present

## 2019-05-06 DIAGNOSIS — D631 Anemia in chronic kidney disease: Secondary | ICD-10-CM | POA: Diagnosis not present

## 2019-05-06 DIAGNOSIS — Z992 Dependence on renal dialysis: Secondary | ICD-10-CM | POA: Diagnosis not present

## 2019-05-08 NOTE — Progress Notes (Signed)
Subjective:  [name] presents to clinic today with cc of  painful, thick, discolored, elongated toenails 1-5 b/l that become tender and cannot cut because of thickness. Pain is aggravated when wearing enclosed shoe gear.  She relates painful left 4th digit was trimmed and treated on last visit and feels it has not gotten any better. Digit is tender to touch.    Prince Solian, MD   Current Outpatient Medications on File Prior to Visit  Medication Sig Dispense Refill  . pantoprazole (PROTONIX) 40 MG tablet Take by mouth.    Marland Kitchen acetaminophen (TYLENOL) 500 MG tablet Take 500 mg by mouth 2 (two) times a day.     . albuterol (PROVENTIL) (2.5 MG/3ML) 0.083% nebulizer solution Take 3 mLs (2.5 mg total) by nebulization every 6 (six) hours as needed for wheezing or shortness of breath. 75 mL 12  . Alcohol Swabs (B-D SINGLE USE SWABS REGULAR) PADS USE UTD FOR INSULIN INJECTIONS TID    . amLODipine (NORVASC) 5 MG tablet Take 5 mg by mouth daily.     Marland Kitchen amoxicillin-clavulanate (AUGMENTIN) 500-125 MG tablet Take 1 tablet by mouth qd on Tuesday, Thursday and Saturday. 10 tablet 0  . aspirin 81 MG chewable tablet Chew 81 mg by mouth daily.     Marland Kitchen atropine 1 % ophthalmic solution Place 1 drop into the right eye 2 (two) times daily.  0  . B Complex-C-Folic Acid (RENA-VITE RX) 1 MG TABS Take 1 tablet by mouth daily with lunch.   1  . BD PEN NEEDLE NANO 2ND GEN 32G X 4 MM MISC USE WITH INSULIN PEN 3 TIMES A DAY    . carvedilol (COREG) 25 MG tablet Take 25 mg by mouth 2 (two) times daily.  2  . clopidogrel (PLAVIX) 75 MG tablet Take 1 tablet (75 mg total) by mouth daily. 30 tablet 11  . darbepoetin (ARANESP) 200 MCG/0.4ML SOLN injection Inject 0.4 mLs (200 mcg total) into the vein every Wednesday with hemodialysis. 1.68 mL   . diclofenac sodium (VOLTAREN) 1 % GEL APP EXT AA UP TO QID    . docusate sodium (COLACE) 100 MG capsule Take 100 mg by mouth daily.    Marland Kitchen doxercalciferol (HECTOROL) 4 MCG/2ML  injection Inject 0.5 mLs (1 mcg total) into the vein every Monday, Wednesday, and Friday with hemodialysis. 2 mL   . DULoxetine (CYMBALTA) 30 MG capsule TK 1 C PO QD    . fluticasone (FLONASE) 50 MCG/ACT nasal spray SHAKE LQ AND U 2 SPRAYS IEN QD    . gabapentin (NEURONTIN) 100 MG capsule Take 100 mg by mouth 2 (two) times daily.    . insulin aspart (NOVOLOG FLEXPEN) 100 UNIT/ML FlexPen Inject 6-10 Units into the skin 3 (three) times daily with meals. per Sliding scale    . lanthanum (FOSRENOL) 1000 MG chewable tablet Chew 2,000 mg by mouth 3 (three) times daily with meals.     Marland Kitchen lisinopril (PRINIVIL,ZESTRIL) 40 MG tablet Take 40 mg by mouth at bedtime.     Marland Kitchen omeprazole (PRILOSEC) 40 MG capsule Take 1 capsule (40 mg total) by mouth daily. (Patient taking differently: Take 40 mg by mouth 2 (two) times daily. )    . ONETOUCH VERIO test strip 3 (three) times daily. for testing  9  . prednisoLONE acetate (PRED FORTE) 1 % ophthalmic suspension Place 1 drop into the right eye 2 (two) times daily.   0  . traMADol (ULTRAM) 50 MG tablet Take by mouth.  Current Facility-Administered Medications on File Prior to Visit  Medication Dose Route Frequency Provider Last Rate Last Dose  . 0.9 %  sodium chloride infusion  500 mL Intravenous Once [name], MD      . dexamethasone (DECADRON) injection 4 mg  4 mg Intramuscular Once Hilts, Legrand Como, MD         Allergies  Allergen Reactions  . Latex Hives  . Oxycodone Other (See Comments)    Hallucinations   . Tramadol Other (See Comments)    hallucinations   . Tape Rash    32M Transpore adhesive tape. Medical tape  . Vicodin [Hydrocodone-Acetaminophen] Other (See Comments)    hallucinations     Objective: There were no vitals filed for this visit.  Physical Examination:  Vascular Examination: Capillary refill time <3 seconds x 10 digits.  Faintly palpable DP pulses.  PT pulses absent b/l.  Digital hair absent b/l.  No edema noted  b/l.  Skin temperature gradient WNL b/l.  Dermatological Examination: Skin thin, shiny and atrophic b/l.  No open wounds b/l.  No interdigital macerations noted b/l.  Elongated, thick, discolored brittle toenails with subungual debris and pain on dorsal palpation of nailbeds 1-5 right, 1, 2, 3, 5 left foot.  Incurvated nailplate right great toe b/l borders with tenderness to palpation. No erythema, no edema, no drainage noted.  Left 4th digit with tenderness to palpation distal tip with residual ingrown nail. Small superficial transverse laceration noted distal tip. No purulence, no drainage.   Left hallux nailplate with onycholysis of entire nailplate lifting from nailbed. No underlying wound. No erythema, no edema, no drainage, no flocculence.  Hyperkeratotic lesion distal edge of medial border of left hallux. No erythema, no edema, no drainage, no flocculence.  Musculoskeletal Examination: Muscle strength 5/5 to all muscle groups b/l.  Hammertoes b/l feet.  No pain, crepitus or joint discomfort with active/passive ROM.  Neurological Examination: Sensation absent b/l with 10 gram monofilament.  Vibratory sensation absent b/l.  Assessment: Mycotic nail infection with pain 1-5 b/l Ingrown nail right hallux, noninfected Callus submet head 2 right foot Onycholysis left hallux Ingrown nail r  Plan: 1.  Toenails 1-5 b/l were debrided in length and girth without iatrogenic  laceration. Offending nail border left 4th digit debrided and curretaged. Borders cleansed with alcohol and triple antibiotic applied. Prescription written for Mupirocin Ointment. Patient is to apply ointment to left 4th digit once daily and cover with dressing. Left hallux nailplate gently debrided of loose nailplate. Nailbed cleansed with alcohol. Triple antibiotic ointment applied to digit. She is to apply Mupirocin Ointment to digit once daily. Call office with any concerns of nonhealing, redness, drainage,  swelling, odor. 2. Continue soft, supportive shoe gear daily.  3.  Report any pedal injuries to medical professional. 4.  Follow up 1 week.  5.  Patient/POA to call should there be a question/concern in there interim.

## 2019-05-09 DIAGNOSIS — N2581 Secondary hyperparathyroidism of renal origin: Secondary | ICD-10-CM | POA: Diagnosis not present

## 2019-05-09 DIAGNOSIS — D509 Iron deficiency anemia, unspecified: Secondary | ICD-10-CM | POA: Diagnosis not present

## 2019-05-09 DIAGNOSIS — N186 End stage renal disease: Secondary | ICD-10-CM | POA: Diagnosis not present

## 2019-05-09 DIAGNOSIS — Z992 Dependence on renal dialysis: Secondary | ICD-10-CM | POA: Diagnosis not present

## 2019-05-09 DIAGNOSIS — D631 Anemia in chronic kidney disease: Secondary | ICD-10-CM | POA: Diagnosis not present

## 2019-05-11 ENCOUNTER — Other Ambulatory Visit: Payer: Self-pay

## 2019-05-11 ENCOUNTER — Ambulatory Visit (INDEPENDENT_AMBULATORY_CARE_PROVIDER_SITE_OTHER): Payer: Medicare Other | Admitting: Podiatry

## 2019-05-11 ENCOUNTER — Encounter: Payer: Self-pay | Admitting: Podiatry

## 2019-05-11 DIAGNOSIS — N2581 Secondary hyperparathyroidism of renal origin: Secondary | ICD-10-CM | POA: Diagnosis not present

## 2019-05-11 DIAGNOSIS — D509 Iron deficiency anemia, unspecified: Secondary | ICD-10-CM | POA: Diagnosis not present

## 2019-05-11 DIAGNOSIS — E1142 Type 2 diabetes mellitus with diabetic polyneuropathy: Secondary | ICD-10-CM

## 2019-05-11 DIAGNOSIS — S91115D Laceration without foreign body of left lesser toe(s) without damage to nail, subsequent encounter: Secondary | ICD-10-CM

## 2019-05-11 DIAGNOSIS — N186 End stage renal disease: Secondary | ICD-10-CM | POA: Diagnosis not present

## 2019-05-11 DIAGNOSIS — Z992 Dependence on renal dialysis: Secondary | ICD-10-CM | POA: Diagnosis not present

## 2019-05-11 DIAGNOSIS — D631 Anemia in chronic kidney disease: Secondary | ICD-10-CM | POA: Diagnosis not present

## 2019-05-11 NOTE — Patient Instructions (Signed)
Diabetes Mellitus and Foot Care Foot care is an important part of your health, especially when you have diabetes. Diabetes may cause you to have problems because of poor blood flow (circulation) to your feet and legs, which can cause your skin to:  Become thinner and drier.  Break more easily.  Heal more slowly.  Peel and crack. You may also have nerve damage (neuropathy) in your legs and feet, causing decreased feeling in them. This means that you may not notice minor injuries to your feet that could lead to more serious problems. Noticing and addressing any potential problems early is the best way to prevent future foot problems. How to care for your feet Foot hygiene  Wash your feet daily with warm water and mild soap. Do not use hot water. Then, pat your feet and the areas between your toes until they are completely dry. Do not soak your feet as this can dry your skin.  Trim your toenails straight across. Do not dig under them or around the cuticle. File the edges of your nails with an emery board or nail file.  Apply a moisturizing lotion or petroleum jelly to the skin on your feet and to dry, brittle toenails. Use lotion that does not contain alcohol and is unscented. Do not apply lotion between your toes. Shoes and socks  Wear clean socks or stockings every day. Make sure they are not too tight. Do not wear knee-high stockings since they may decrease blood flow to your legs.  Wear shoes that fit properly and have enough cushioning. Always look in your shoes before you put them on to be sure there are no objects inside.  To break in new shoes, wear them for just a few hours a day. This prevents injuries on your feet. Wounds, scrapes, corns, and calluses  Check your feet daily for blisters, cuts, bruises, sores, and redness. If you cannot see the bottom of your feet, use a mirror or ask someone for help.  Do not cut corns or calluses or try to remove them with medicine.  If you  find a minor scrape, cut, or break in the skin on your feet, keep it and the skin around it clean and dry. You may clean these areas with mild soap and water. Do not clean the area with peroxide, alcohol, or iodine.  If you have a wound, scrape, corn, or callus on your foot, look at it several times a day to make sure it is healing and not infected. Check for: ? Redness, swelling, or pain. ? Fluid or blood. ? Warmth. ? Pus or a bad smell. General instructions  Do not cross your legs. This may decrease blood flow to your feet.  Do not use heating pads or hot water bottles on your feet. They may burn your skin. If you have lost feeling in your feet or legs, you may not know this is happening until it is too late.  Protect your feet from hot and cold by wearing shoes, such as at the beach or on hot pavement.  Schedule a complete foot exam at least once a year (annually) or more often if you have foot problems. If you have foot problems, report any cuts, sores, or bruises to your health care provider immediately. Contact a health care provider if:  You have a medical condition that increases your risk of infection and you have any cuts, sores, or bruises on your feet.  You have an injury that is not   healing.  You have redness on your legs or feet.  You feel burning or tingling in your legs or feet.  You have pain or cramps in your legs and feet.  Your legs or feet are numb.  Your feet always feel cold.  You have pain around a toenail. Get help right away if:  You have a wound, scrape, corn, or callus on your foot and: ? You have pain, swelling, or redness that gets worse. ? You have fluid or blood coming from the wound, scrape, corn, or callus. ? Your wound, scrape, corn, or callus feels warm to the touch. ? You have pus or a bad smell coming from the wound, scrape, corn, or callus. ? You have a fever. ? You have a red line going up your leg. Summary  Check your feet every day  for cuts, sores, red spots, swelling, and blisters.  Moisturize feet and legs daily.  Wear shoes that fit properly and have enough cushioning.  If you have foot problems, report any cuts, sores, or bruises to your health care provider immediately.  Schedule a complete foot exam at least once a year (annually) or more often if you have foot problems. This information is not intended to replace advice given to you by your health care provider. Make sure you discuss any questions you have with your health care provider. Document Released: 07/04/2000 Document Revised: 08/19/2017 Document Reviewed: 08/08/2016 Elsevier Patient Education  2020 [employer]  

## 2019-05-13 DIAGNOSIS — D509 Iron deficiency anemia, unspecified: Secondary | ICD-10-CM | POA: Diagnosis not present

## 2019-05-13 DIAGNOSIS — N2581 Secondary hyperparathyroidism of renal origin: Secondary | ICD-10-CM | POA: Diagnosis not present

## 2019-05-13 DIAGNOSIS — D631 Anemia in chronic kidney disease: Secondary | ICD-10-CM | POA: Diagnosis not present

## 2019-05-13 DIAGNOSIS — N186 End stage renal disease: Secondary | ICD-10-CM | POA: Diagnosis not present

## 2019-05-13 DIAGNOSIS — Z992 Dependence on renal dialysis: Secondary | ICD-10-CM | POA: Diagnosis not present

## 2019-05-15 NOTE — Progress Notes (Signed)
Subjective:   [name] presents for continued care of laceration left 4th digit.  Patient has been performing daily dressing changes to left 4th digit daily utilizing Mupirocin Ointment. She still has some discomfort in digit, but it feels better than last visit. Pt. denies any new complaints.  Patient denies any fever, chills, nightsweats, nausea or vomiting.  Current Outpatient Medications on File Prior to Visit  Medication Sig Dispense Refill  . Sennosides (SENNA) 8.8 MG/5ML SYRP Take by mouth.    Marland Kitchen acetaminophen (TYLENOL) 500 MG tablet Take 500 mg by mouth 2 (two) times a day.     . albuterol (PROVENTIL) (2.5 MG/3ML) 0.083% nebulizer solution Take 3 mLs (2.5 mg total) by nebulization every 6 (six) hours as needed for wheezing or shortness of breath. 75 mL 12  . Alcohol Swabs (B-D SINGLE USE SWABS REGULAR) PADS USE UTD FOR INSULIN INJECTIONS TID    . amLODipine (NORVASC) 5 MG tablet Take 5 mg by mouth daily.     Marland Kitchen amoxicillin-clavulanate (AUGMENTIN) 500-125 MG tablet Take 1 tablet by mouth qd on Tuesday, Thursday and Saturday. 10 tablet 0  . aspirin 81 MG chewable tablet Chew 81 mg by mouth daily.     Marland Kitchen atropine 1 % ophthalmic solution Place 1 drop into the right eye 2 (two) times daily.  0  . B Complex-C-Folic Acid (RENA-VITE RX) 1 MG TABS Take 1 tablet by mouth daily with lunch.   1  . BD PEN NEEDLE NANO 2ND GEN 32G X 4 MM MISC USE WITH INSULIN PEN 3 TIMES A DAY    . carvedilol (COREG) 25 MG tablet Take 25 mg by mouth 2 (two) times daily.  2  . clopidogrel (PLAVIX) 75 MG tablet Take 1 tablet (75 mg total) by mouth daily. 30 tablet 11  . darbepoetin (ARANESP) 200 MCG/0.4ML SOLN injection Inject 0.4 mLs (200 mcg total) into the vein every Wednesday with hemodialysis. 1.68 mL   . diclofenac sodium (VOLTAREN) 1 % GEL APP EXT AA UP TO QID    . docusate sodium (COLACE) 100 MG capsule Take 100 mg by mouth daily.    Marland Kitchen doxercalciferol (HECTOROL) 4 MCG/2ML injection Inject 0.5 mLs (1 mcg  total) into the vein every Monday, Wednesday, and Friday with hemodialysis. 2 mL   . DULoxetine (CYMBALTA) 30 MG capsule TK 1 C PO QD    . fluticasone (FLONASE) 50 MCG/ACT nasal spray SHAKE LQ AND U 2 SPRAYS IEN QD    . gabapentin (NEURONTIN) 100 MG capsule Take 100 mg by mouth 2 (two) times daily.    . insulin aspart (NOVOLOG FLEXPEN) 100 UNIT/ML FlexPen Inject 6-10 Units into the skin 3 (three) times daily with meals. per Sliding scale    . lanthanum (FOSRENOL) 1000 MG chewable tablet Chew 2,000 mg by mouth 3 (three) times daily with meals.     Marland Kitchen lisinopril (PRINIVIL,ZESTRIL) 40 MG tablet Take 40 mg by mouth at bedtime.     . mupirocin ointment (BACTROBAN) 2 % Apply to both great toes and left 4th toe once daily 22 g 0  . omeprazole (PRILOSEC) 40 MG capsule Take 1 capsule (40 mg total) by mouth daily. (Patient taking differently: Take 40 mg by mouth 2 (two) times daily. )    . ONETOUCH VERIO test strip 3 (three) times daily. for testing  9  . pantoprazole (PROTONIX) 40 MG tablet Take by mouth.    . prednisoLONE acetate (PRED FORTE) 1 % ophthalmic suspension Place 1  drop into the right eye 2 (two) times daily.   0  . traMADol (ULTRAM) 50 MG tablet Take by mouth.     Current Facility-Administered Medications on File Prior to Visit  Medication Dose Route Frequency Provider Last Rate Last Dose  . 0.9 %  sodium chloride infusion  500 mL Intravenous Once [name], MD      . dexamethasone (DECADRON) injection 4 mg  4 mg Intramuscular Once Hilts, Legrand Como, MD         Allergies  Allergen Reactions  . Latex Hives  . Oxycodone Other (See Comments)    Hallucinations   . Tramadol Other (See Comments)    hallucinations   . Tape Rash    41M Transpore adhesive tape. Medical tape  . Vicodin [Hydrocodone-Acetaminophen] Other (See Comments)    hallucinations    Objective:   Vascular Examination: Capillary refill time <3 seconds b/l.  Dorsalis pedis pulses faintly palpable b/l.  Posterior  tibial pulses absent b/l.  Digital hair absent b/l.   Skin temperature gradient WNL b/l.  Dermatological Examination: Skin thin, shiny and atrophic b/l.  Toenails 1-5 b/l recently debrided.  Left hallux nailbed completely epithelialized. No erythema, no edema, no ischemia, no drainage, no flocculence.  Left 4th digit with slowly healing laceration distal tip. No erythema, no edema, no drainage, no flocculence. Still viable. No ischemia.  Musculoskeletal: Muscle strength 5/5 to all LE muscle groups bilaterally.  Hammertoes b/l feet.   Neurological: Sensation absent bilaterally with 10 gram monofilament.  Vibratory sensation absent b/l.  Assessment:   1. Slowly healing laceration left 4th digit 2.    NIDDM with peripheral neuropathy  Plan: 1. Left 4th digit cleansed with alcohol. Triple antibiotic ointment and band-aid was applied. 2. [name] sister was instructed to continue applying Mupirocin Ointment to digit daily. 3. Patient to follow up with Dr. March Rummage in 2 weeks. Call sooner if she has any changes/concerns. 4. Patient is to follow up with me in 9 weeks for routine foot care. 5. Patient instructed to report to emergency department with worsening appearance of left 4th digit, increased pain, foul odor, increased redness, swelling, drainage, fever, chills, nightsweats, nausea, vomiting, increased blood sugar.  6. Patient/POA related understanding.

## 2019-05-16 DIAGNOSIS — N2581 Secondary hyperparathyroidism of renal origin: Secondary | ICD-10-CM | POA: Diagnosis not present

## 2019-05-16 DIAGNOSIS — N186 End stage renal disease: Secondary | ICD-10-CM | POA: Diagnosis not present

## 2019-05-16 DIAGNOSIS — D631 Anemia in chronic kidney disease: Secondary | ICD-10-CM | POA: Diagnosis not present

## 2019-05-16 DIAGNOSIS — Z992 Dependence on renal dialysis: Secondary | ICD-10-CM | POA: Diagnosis not present

## 2019-05-16 DIAGNOSIS — D509 Iron deficiency anemia, unspecified: Secondary | ICD-10-CM | POA: Diagnosis not present

## 2019-05-18 DIAGNOSIS — D509 Iron deficiency anemia, unspecified: Secondary | ICD-10-CM | POA: Diagnosis not present

## 2019-05-18 DIAGNOSIS — D631 Anemia in chronic kidney disease: Secondary | ICD-10-CM | POA: Diagnosis not present

## 2019-05-18 DIAGNOSIS — N2581 Secondary hyperparathyroidism of renal origin: Secondary | ICD-10-CM | POA: Diagnosis not present

## 2019-05-18 DIAGNOSIS — Z992 Dependence on renal dialysis: Secondary | ICD-10-CM | POA: Diagnosis not present

## 2019-05-18 DIAGNOSIS — N186 End stage renal disease: Secondary | ICD-10-CM | POA: Diagnosis not present

## 2019-05-20 DIAGNOSIS — D631 Anemia in chronic kidney disease: Secondary | ICD-10-CM | POA: Diagnosis not present

## 2019-05-20 DIAGNOSIS — Z992 Dependence on renal dialysis: Secondary | ICD-10-CM | POA: Diagnosis not present

## 2019-05-20 DIAGNOSIS — D509 Iron deficiency anemia, unspecified: Secondary | ICD-10-CM | POA: Diagnosis not present

## 2019-05-20 DIAGNOSIS — N186 End stage renal disease: Secondary | ICD-10-CM | POA: Diagnosis not present

## 2019-05-20 DIAGNOSIS — N2581 Secondary hyperparathyroidism of renal origin: Secondary | ICD-10-CM | POA: Diagnosis not present

## 2019-05-22 DIAGNOSIS — N186 End stage renal disease: Secondary | ICD-10-CM | POA: Diagnosis not present

## 2019-05-22 DIAGNOSIS — Z992 Dependence on renal dialysis: Secondary | ICD-10-CM | POA: Diagnosis not present

## 2019-05-22 DIAGNOSIS — E1129 Type 2 diabetes mellitus with other diabetic kidney complication: Secondary | ICD-10-CM | POA: Diagnosis not present

## 2019-05-23 DIAGNOSIS — Z992 Dependence on renal dialysis: Secondary | ICD-10-CM | POA: Diagnosis not present

## 2019-05-23 DIAGNOSIS — N2581 Secondary hyperparathyroidism of renal origin: Secondary | ICD-10-CM | POA: Diagnosis not present

## 2019-05-23 DIAGNOSIS — D509 Iron deficiency anemia, unspecified: Secondary | ICD-10-CM | POA: Diagnosis not present

## 2019-05-23 DIAGNOSIS — D631 Anemia in chronic kidney disease: Secondary | ICD-10-CM | POA: Diagnosis not present

## 2019-05-23 DIAGNOSIS — N186 End stage renal disease: Secondary | ICD-10-CM | POA: Diagnosis not present

## 2019-05-25 DIAGNOSIS — N2581 Secondary hyperparathyroidism of renal origin: Secondary | ICD-10-CM | POA: Diagnosis not present

## 2019-05-25 DIAGNOSIS — N186 End stage renal disease: Secondary | ICD-10-CM | POA: Diagnosis not present

## 2019-05-25 DIAGNOSIS — D631 Anemia in chronic kidney disease: Secondary | ICD-10-CM | POA: Diagnosis not present

## 2019-05-25 DIAGNOSIS — Z992 Dependence on renal dialysis: Secondary | ICD-10-CM | POA: Diagnosis not present

## 2019-05-25 DIAGNOSIS — D509 Iron deficiency anemia, unspecified: Secondary | ICD-10-CM | POA: Diagnosis not present

## 2019-05-26 ENCOUNTER — Ambulatory Visit (INDEPENDENT_AMBULATORY_CARE_PROVIDER_SITE_OTHER): Payer: Medicare Other | Admitting: Podiatry

## 2019-05-26 ENCOUNTER — Other Ambulatory Visit: Payer: Self-pay

## 2019-05-26 DIAGNOSIS — L97521 Non-pressure chronic ulcer of other part of left foot limited to breakdown of skin: Secondary | ICD-10-CM

## 2019-05-26 DIAGNOSIS — M79676 Pain in unspecified toe(s): Secondary | ICD-10-CM

## 2019-05-26 NOTE — Progress Notes (Signed)
Subjective:  Patient ID: [name], female    DOB: May 20, 1951,  MRN: [id number]  Chief Complaint  Patient presents with  . Diabetic Ulcer    2 week ulcer check, left great toe is healing well - fourth toe is very painful, not healling at all    68 y.o. female presents for wound care.  States that the left fourth toe is very painful but she thinks it is doing a little better.   Review of Systems: Negative except as noted in the HPI. Denies N/V/F/Ch.  Past Medical History:  Diagnosis Date  . Anemia   . Anxiety   . Arthritis    "joints" (06/15/2013)  . Asthma   . 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 Blood transfusion without reported diagnosis   . Breast cancer ([place])    left  . CHF (congestive heart failure) ([place])   . 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 Esophageal ulcer with bleeding   . ESRD (end stage renal disease) ([place])    "suppose to start dialysis today" (06/15/2013)  . GERD (gastroesophageal reflux disease)   . Glaucoma    blind in both eyes  . Heart murmur   . Hx of adenomatous colonic polyps 04/07/2018  . Hypertension   . Myalgia 12/31/2011  . Neuropathy 12/31/2011  . Shortness of breath    "when she doesn't go to dialysis"  . Type II diabetes mellitus (HCC)     Current Outpatient Medications:  .  acetaminophen (TYLENOL) 500 MG tablet, Take 500 mg by mouth 2 (two) times a day. , Disp: , Rfl:  .  albuterol (PROVENTIL) (2.5 MG/3ML) 0.083% nebulizer solution, Take 3 mLs (2.5 mg total) by nebulization every 6 (six) hours as needed for wheezing or shortness of breath., Disp: 75 mL, Rfl: 12 .  Alcohol Swabs (B-D SINGLE USE SWABS REGULAR) PADS, USE UTD FOR INSULIN INJECTIONS TID, Disp: , Rfl:  .  amLODipine (NORVASC) 5 MG tablet, Take 5 mg by mouth daily. , Disp: , Rfl:  .  amoxicillin-clavulanate (AUGMENTIN) 500-125 MG tablet, Take 1 tablet by mouth qd on Tuesday, Thursday and Saturday., Disp: 10 tablet, Rfl: 0 .  aspirin 81 MG  chewable tablet, Chew 81 mg by mouth daily. , Disp: , Rfl:  .  atropine 1 % ophthalmic solution, Place 1 drop into the right eye 2 (two) times daily., Disp: , Rfl: 0 .  B Complex-C-Folic Acid (RENA-VITE RX) 1 MG TABS, Take 1 tablet by mouth daily with lunch. , Disp: , Rfl: 1 .  BD PEN NEEDLE NANO 2ND GEN 32G X 4 MM MISC, USE WITH INSULIN PEN 3 TIMES A DAY, Disp: , Rfl:  .  carvedilol (COREG) 25 MG tablet, Take 25 mg by mouth 2 (two) times daily., Disp: , Rfl: 2 .  clopidogrel (PLAVIX) 75 MG tablet, Take 1 tablet (75 mg total) by mouth daily., Disp: 30 tablet, Rfl: 11 .  darbepoetin (ARANESP) 200 MCG/0.4ML SOLN injection, Inject 0.4 mLs (200 mcg total) into the vein every Wednesday with hemodialysis., Disp: 1.68 mL, Rfl:  .  diclofenac sodium (VOLTAREN) 1 % GEL, APP EXT AA UP TO QID, Disp: , Rfl:  .  docusate sodium (COLACE) 100 MG capsule, Take 100 mg by mouth daily., Disp: , Rfl:  .  doxercalciferol (HECTOROL) 4 MCG/2ML injection, Inject 0.5 mLs (1 mcg total) into the vein every Monday, Wednesday, and Friday with hemodialysis., Disp: 2 mL, Rfl:  .  DULoxetine (CYMBALTA) 30 MG capsule, TK 1 C PO QD, Disp: , Rfl:  .  fluticasone (FLONASE) 50 MCG/ACT nasal spray, SHAKE LQ AND U 2 SPRAYS IEN QD, Disp: , Rfl:  .  gabapentin (NEURONTIN) 100 MG capsule, Take 100 mg by mouth 2 (two) times daily., Disp: , Rfl:  .  insulin aspart (NOVOLOG FLEXPEN) 100 UNIT/ML FlexPen, Inject 6-10 Units into the skin 3 (three) times daily with meals. per Sliding scale, Disp: , Rfl:  .  lanthanum (FOSRENOL) 1000 MG chewable tablet, Chew 2,000 mg by mouth 3 (three) times daily with meals. , Disp: , Rfl:  .  lisinopril (PRINIVIL,ZESTRIL) 40 MG tablet, Take 40 mg by mouth at bedtime. , Disp: , Rfl:  .  omeprazole (PRILOSEC) 40 MG capsule, Take 1 capsule (40 mg total) by mouth daily. (Patient taking differently: Take 40 mg by mouth 2 (two) times daily. ), Disp: , Rfl:  .  ONETOUCH VERIO test strip, 3 (three) times daily. for  testing, Disp: , Rfl: 9 .  pantoprazole (PROTONIX) 40 MG tablet, Take by mouth., Disp: , Rfl:  .  prednisoLONE acetate (PRED FORTE) 1 % ophthalmic suspension, Place 1 drop into the right eye 2 (two) times daily. , Disp: , Rfl: 0 .  Sennosides (SENNA) 8.8 MG/5ML SYRP, Take by mouth., Disp: , Rfl:  .  traMADol (ULTRAM) 50 MG tablet, Take by mouth., Disp: , Rfl:   Current Facility-Administered Medications:  .  0.9 %  sodium chloride infusion, 500 mL, Intravenous, Once, [name], MD .  dexamethasone (DECADRON) injection 4 mg, 4 mg, Intramuscular, Once, Hilts, Meegan Shanafelt, MD  Social History   Tobacco Use  Smoking Status Never Smoker  Smokeless Tobacco Never Used    Allergies  Allergen Reactions  . Latex Hives  . Oxycodone Other (See Comments)    Hallucinations   . Tramadol Other (See Comments)    hallucinations   . Tape Rash    35M Transpore adhesive tape. Medical tape  . Vicodin [Hydrocodone-Acetaminophen] Other (See Comments)    hallucinations   Objective:  There were no vitals filed for this visit. There is no height or weight on file to calculate BMI. Constitutional Well developed. Well nourished.  Vascular  left foot warm to touch with poorly palpable pulses Capillary refill normal to all digits.  No cyanosis or clubbing noted. Pedal hair growth absent.  Neurologic Normal speech. Oriented to person, place, and time. Protective sensation absent  Dermatologic Wound Location: Left fourth toe Wound Base: Mixed Granular/Fibrotic Peri-wound: Normal Exudate: Scant/small amount Serosanguinous exudate  Orthopedic:  Pain palpation of the left fourth toe   Radiographs: None Assessment:   1. Ulcer of toe, left, limited to breakdown of skin [facility])    Plan:  Patient was evaluated and treated and all questions answered.  Ulcer left fourth toe -Gently debrided.  No procedural debridement -Dressed daily with Medihoney and Band-Aid -I think this will heal but it will take  some time. -No signs of acute infection noted. -Follow-up in 2 weeks for recheck  No follow-ups on file.

## 2019-05-27 DIAGNOSIS — N2581 Secondary hyperparathyroidism of renal origin: Secondary | ICD-10-CM | POA: Diagnosis not present

## 2019-05-27 DIAGNOSIS — Z992 Dependence on renal dialysis: Secondary | ICD-10-CM | POA: Diagnosis not present

## 2019-05-27 DIAGNOSIS — D509 Iron deficiency anemia, unspecified: Secondary | ICD-10-CM | POA: Diagnosis not present

## 2019-05-27 DIAGNOSIS — D631 Anemia in chronic kidney disease: Secondary | ICD-10-CM | POA: Diagnosis not present

## 2019-05-27 DIAGNOSIS — N186 End stage renal disease: Secondary | ICD-10-CM | POA: Diagnosis not present

## 2019-05-30 DIAGNOSIS — D509 Iron deficiency anemia, unspecified: Secondary | ICD-10-CM | POA: Diagnosis not present

## 2019-05-30 DIAGNOSIS — Z992 Dependence on renal dialysis: Secondary | ICD-10-CM | POA: Diagnosis not present

## 2019-05-30 DIAGNOSIS — N186 End stage renal disease: Secondary | ICD-10-CM | POA: Diagnosis not present

## 2019-05-30 DIAGNOSIS — D631 Anemia in chronic kidney disease: Secondary | ICD-10-CM | POA: Diagnosis not present

## 2019-05-30 DIAGNOSIS — N2581 Secondary hyperparathyroidism of renal origin: Secondary | ICD-10-CM | POA: Diagnosis not present

## 2019-05-31 DIAGNOSIS — M25732 Osteophyte, left wrist: Secondary | ICD-10-CM | POA: Diagnosis not present

## 2019-06-01 DIAGNOSIS — N186 End stage renal disease: Secondary | ICD-10-CM | POA: Diagnosis not present

## 2019-06-01 DIAGNOSIS — D509 Iron deficiency anemia, unspecified: Secondary | ICD-10-CM | POA: Diagnosis not present

## 2019-06-01 DIAGNOSIS — D631 Anemia in chronic kidney disease: Secondary | ICD-10-CM | POA: Diagnosis not present

## 2019-06-01 DIAGNOSIS — N2581 Secondary hyperparathyroidism of renal origin: Secondary | ICD-10-CM | POA: Diagnosis not present

## 2019-06-01 DIAGNOSIS — Z992 Dependence on renal dialysis: Secondary | ICD-10-CM | POA: Diagnosis not present

## 2019-06-03 DIAGNOSIS — N2581 Secondary hyperparathyroidism of renal origin: Secondary | ICD-10-CM | POA: Diagnosis not present

## 2019-06-03 DIAGNOSIS — Z992 Dependence on renal dialysis: Secondary | ICD-10-CM | POA: Diagnosis not present

## 2019-06-03 DIAGNOSIS — D509 Iron deficiency anemia, unspecified: Secondary | ICD-10-CM | POA: Diagnosis not present

## 2019-06-03 DIAGNOSIS — D631 Anemia in chronic kidney disease: Secondary | ICD-10-CM | POA: Diagnosis not present

## 2019-06-03 DIAGNOSIS — N186 End stage renal disease: Secondary | ICD-10-CM | POA: Diagnosis not present

## 2019-06-06 DIAGNOSIS — N186 End stage renal disease: Secondary | ICD-10-CM | POA: Diagnosis not present

## 2019-06-06 DIAGNOSIS — D509 Iron deficiency anemia, unspecified: Secondary | ICD-10-CM | POA: Diagnosis not present

## 2019-06-06 DIAGNOSIS — D631 Anemia in chronic kidney disease: Secondary | ICD-10-CM | POA: Diagnosis not present

## 2019-06-06 DIAGNOSIS — N2581 Secondary hyperparathyroidism of renal origin: Secondary | ICD-10-CM | POA: Diagnosis not present

## 2019-06-06 DIAGNOSIS — Z992 Dependence on renal dialysis: Secondary | ICD-10-CM | POA: Diagnosis not present

## 2019-06-08 DIAGNOSIS — N2581 Secondary hyperparathyroidism of renal origin: Secondary | ICD-10-CM | POA: Diagnosis not present

## 2019-06-08 DIAGNOSIS — D631 Anemia in chronic kidney disease: Secondary | ICD-10-CM | POA: Diagnosis not present

## 2019-06-08 DIAGNOSIS — D509 Iron deficiency anemia, unspecified: Secondary | ICD-10-CM | POA: Diagnosis not present

## 2019-06-08 DIAGNOSIS — Z992 Dependence on renal dialysis: Secondary | ICD-10-CM | POA: Diagnosis not present

## 2019-06-08 DIAGNOSIS — N186 End stage renal disease: Secondary | ICD-10-CM | POA: Diagnosis not present

## 2019-06-09 ENCOUNTER — Other Ambulatory Visit: Payer: Self-pay

## 2019-06-09 ENCOUNTER — Ambulatory Visit (INDEPENDENT_AMBULATORY_CARE_PROVIDER_SITE_OTHER): Payer: Medicare Other | Admitting: Podiatry

## 2019-06-09 DIAGNOSIS — M2012 Hallux valgus (acquired), left foot: Secondary | ICD-10-CM

## 2019-06-09 DIAGNOSIS — L97521 Non-pressure chronic ulcer of other part of left foot limited to breakdown of skin: Secondary | ICD-10-CM | POA: Diagnosis not present

## 2019-06-09 DIAGNOSIS — I739 Peripheral vascular disease, unspecified: Secondary | ICD-10-CM | POA: Diagnosis not present

## 2019-06-09 NOTE — Progress Notes (Signed)
Subjective:  Patient ID: [name], female    DOB: 12-Feb-1951,  MRN: [id number]  Chief Complaint  Patient presents with  . Follow-up    ulcer , left foot 4th digit. no complaints - wants b/l heels to be looked at , cracked skin .     68 y.o. female presents for wound care. States the 4th toe is looking better, but still hurts.  New today but unknown to patient is a sore to the inside of the right 2nd toe. Unsure how long it has been there.  Review of Systems: Negative except as noted in the HPI. Denies N/V/F/Ch.  Past Medical History:  Diagnosis Date  . Anemia   . Anxiety   . Arthritis    "joints" (06/15/2013)  . Asthma   . 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 Blood transfusion without reported diagnosis   . Breast cancer ([place])    left  . CHF (congestive heart failure) ([place])   . 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 Esophageal ulcer with bleeding   . ESRD (end stage renal disease) ([place])    "suppose to start dialysis today" (06/15/2013)  . GERD (gastroesophageal reflux disease)   . Glaucoma    blind in both eyes  . Heart murmur   . Hx of adenomatous colonic polyps 04/07/2018  . Hypertension   . Myalgia 12/31/2011  . Neuropathy 12/31/2011  . Shortness of breath    "when she doesn't go to dialysis"  . Type II diabetes mellitus (HCC)     Current Outpatient Medications:  .  acetaminophen (TYLENOL) 500 MG tablet, Take 500 mg by mouth 2 (two) times a day. , Disp: , Rfl:  .  albuterol (PROVENTIL) (2.5 MG/3ML) 0.083% nebulizer solution, Take 3 mLs (2.5 mg total) by nebulization every 6 (six) hours as needed for wheezing or shortness of breath., Disp: 75 mL, Rfl: 12 .  Alcohol Swabs (B-D SINGLE USE SWABS REGULAR) PADS, USE UTD FOR INSULIN INJECTIONS TID, Disp: , Rfl:  .  amLODipine (NORVASC) 5 MG tablet, Take 5 mg by mouth daily. , Disp: , Rfl:  .  amoxicillin-clavulanate (AUGMENTIN) 500-125 MG tablet, Take 1 tablet by mouth qd on Tuesday,  Thursday and Saturday., Disp: 10 tablet, Rfl: 0 .  aspirin 81 MG chewable tablet, Chew 81 mg by mouth daily. , Disp: , Rfl:  .  atropine 1 % ophthalmic solution, Place 1 drop into the right eye 2 (two) times daily., Disp: , Rfl: 0 .  B Complex-C-Folic Acid (RENA-VITE RX) 1 MG TABS, Take 1 tablet by mouth daily with lunch. , Disp: , Rfl: 1 .  BD PEN NEEDLE NANO 2ND GEN 32G X 4 MM MISC, USE WITH INSULIN PEN 3 TIMES A DAY, Disp: , Rfl:  .  carvedilol (COREG) 25 MG tablet, Take 25 mg by mouth 2 (two) times daily., Disp: , Rfl: 2 .  clopidogrel (PLAVIX) 75 MG tablet, Take 1 tablet (75 mg total) by mouth daily., Disp: 30 tablet, Rfl: 11 .  darbepoetin (ARANESP) 200 MCG/0.4ML SOLN injection, Inject 0.4 mLs (200 mcg total) into the vein every Wednesday with hemodialysis., Disp: 1.68 mL, Rfl:  .  diclofenac sodium (VOLTAREN) 1 % GEL, APP EXT AA UP TO QID, Disp: , Rfl:  .  docusate sodium (COLACE) 100 MG capsule, Take 100 mg by mouth daily., Disp: , Rfl:  .  doxercalciferol (HECTOROL) 4 MCG/2ML injection, Inject 0.5 mLs (1 mcg total) into  the vein every Monday, Wednesday, and Friday with hemodialysis., Disp: 2 mL, Rfl:  .  DULoxetine (CYMBALTA) 30 MG capsule, TK 1 C PO QD, Disp: , Rfl:  .  fluticasone (FLONASE) 50 MCG/ACT nasal spray, SHAKE LQ AND U 2 SPRAYS IEN QD, Disp: , Rfl:  .  gabapentin (NEURONTIN) 100 MG capsule, Take 100 mg by mouth 2 (two) times daily., Disp: , Rfl:  .  insulin aspart (NOVOLOG FLEXPEN) 100 UNIT/ML FlexPen, Inject 6-10 Units into the skin 3 (three) times daily with meals. per Sliding scale, Disp: , Rfl:  .  lanthanum (FOSRENOL) 1000 MG chewable tablet, Chew 2,000 mg by mouth 3 (three) times daily with meals. , Disp: , Rfl:  .  lisinopril (PRINIVIL,ZESTRIL) 40 MG tablet, Take 40 mg by mouth at bedtime. , Disp: , Rfl:  .  omeprazole (PRILOSEC) 40 MG capsule, Take 1 capsule (40 mg total) by mouth daily. (Patient taking differently: Take 40 mg by mouth 2 (two) times daily. ), Disp: ,  Rfl:  .  ONETOUCH VERIO test strip, 3 (three) times daily. for testing, Disp: , Rfl: 9 .  pantoprazole (PROTONIX) 40 MG tablet, Take by mouth., Disp: , Rfl:  .  prednisoLONE acetate (PRED FORTE) 1 % ophthalmic suspension, Place 1 drop into the right eye 2 (two) times daily. , Disp: , Rfl: 0 .  Sennosides (SENNA) 8.8 MG/5ML SYRP, Take by mouth., Disp: , Rfl:  .  traMADol (ULTRAM) 50 MG tablet, Take by mouth., Disp: , Rfl:   Current Facility-Administered Medications:  .  0.9 %  sodium chloride infusion, 500 mL, Intravenous, Once, [name], MD .  dexamethasone (DECADRON) injection 4 mg, 4 mg, Intramuscular, Once, Hilts, Paolina Karwowski, MD  Social History   Tobacco Use  Smoking Status Never Smoker  Smokeless Tobacco Never Used    Allergies  Allergen Reactions  . Latex Hives  . Oxycodone Other (See Comments)    Hallucinations   . Tramadol Other (See Comments)    hallucinations   . Tape Rash    108M Transpore adhesive tape. Medical tape  . Vicodin [Hydrocodone-Acetaminophen] Other (See Comments)    hallucinations   Objective:  There were no vitals filed for this visit. There is no height or weight on file to calculate BMI. Constitutional Well developed. Well nourished.  Vascular  left foot cool to touch with poorly palpable pulses Capillary refill normal to all digits.  No cyanosis or clubbing noted. Pedal hair growth absent.  Neurologic Normal speech. Oriented to person, place, and time. Protective sensation absent  Dermatologic Wound left 4th toe epithelialized Wound left medial 2nd toe with central eschar, no warmth erythema signs of infection.  Orthopedic:  Pain palpation of the left fourth toe Hallux valgus left   Radiographs: None Assessment:   1. Ulcer of toe, left, limited to breakdown of skin (HCC)   2. Hallux valgus, left   3. PAD (peripheral artery disease) ([place])    Plan:  Patient was evaluated and treated and all questions answered.  Ulcer left fourth toe,  does appear healed. Left 2nd toe ulcer. Left foot worsening ischemia -I do think that she needs to f/u with vascular given worsening ischemic changes. -Medihoney applied to the 2nd toe ulceration. Offloaded with toe spacer -Left 4th toe spacer healing well but still with pain -Continue surgical shoe.  Return in about 4 weeks (around 07/07/2019) for Wound Care, Left.

## 2019-06-10 DIAGNOSIS — Z992 Dependence on renal dialysis: Secondary | ICD-10-CM | POA: Diagnosis not present

## 2019-06-10 DIAGNOSIS — N2581 Secondary hyperparathyroidism of renal origin: Secondary | ICD-10-CM | POA: Diagnosis not present

## 2019-06-10 DIAGNOSIS — D509 Iron deficiency anemia, unspecified: Secondary | ICD-10-CM | POA: Diagnosis not present

## 2019-06-10 DIAGNOSIS — N186 End stage renal disease: Secondary | ICD-10-CM | POA: Diagnosis not present

## 2019-06-10 DIAGNOSIS — D631 Anemia in chronic kidney disease: Secondary | ICD-10-CM | POA: Diagnosis not present

## 2019-06-12 DIAGNOSIS — Z992 Dependence on renal dialysis: Secondary | ICD-10-CM | POA: Diagnosis not present

## 2019-06-12 DIAGNOSIS — D509 Iron deficiency anemia, unspecified: Secondary | ICD-10-CM | POA: Diagnosis not present

## 2019-06-12 DIAGNOSIS — N186 End stage renal disease: Secondary | ICD-10-CM | POA: Diagnosis not present

## 2019-06-12 DIAGNOSIS — N2581 Secondary hyperparathyroidism of renal origin: Secondary | ICD-10-CM | POA: Diagnosis not present

## 2019-06-12 DIAGNOSIS — D631 Anemia in chronic kidney disease: Secondary | ICD-10-CM | POA: Diagnosis not present

## 2019-06-14 DIAGNOSIS — N2581 Secondary hyperparathyroidism of renal origin: Secondary | ICD-10-CM | POA: Diagnosis not present

## 2019-06-14 DIAGNOSIS — Z992 Dependence on renal dialysis: Secondary | ICD-10-CM | POA: Diagnosis not present

## 2019-06-14 DIAGNOSIS — N186 End stage renal disease: Secondary | ICD-10-CM | POA: Diagnosis not present

## 2019-06-14 DIAGNOSIS — D631 Anemia in chronic kidney disease: Secondary | ICD-10-CM | POA: Diagnosis not present

## 2019-06-14 DIAGNOSIS — D509 Iron deficiency anemia, unspecified: Secondary | ICD-10-CM | POA: Diagnosis not present

## 2019-06-17 DIAGNOSIS — D631 Anemia in chronic kidney disease: Secondary | ICD-10-CM | POA: Diagnosis not present

## 2019-06-17 DIAGNOSIS — Z992 Dependence on renal dialysis: Secondary | ICD-10-CM | POA: Diagnosis not present

## 2019-06-17 DIAGNOSIS — N186 End stage renal disease: Secondary | ICD-10-CM | POA: Diagnosis not present

## 2019-06-17 DIAGNOSIS — D509 Iron deficiency anemia, unspecified: Secondary | ICD-10-CM | POA: Diagnosis not present

## 2019-06-17 DIAGNOSIS — N2581 Secondary hyperparathyroidism of renal origin: Secondary | ICD-10-CM | POA: Diagnosis not present

## 2019-06-20 DIAGNOSIS — D509 Iron deficiency anemia, unspecified: Secondary | ICD-10-CM | POA: Diagnosis not present

## 2019-06-20 DIAGNOSIS — N2581 Secondary hyperparathyroidism of renal origin: Secondary | ICD-10-CM | POA: Diagnosis not present

## 2019-06-20 DIAGNOSIS — N186 End stage renal disease: Secondary | ICD-10-CM | POA: Diagnosis not present

## 2019-06-20 DIAGNOSIS — Z992 Dependence on renal dialysis: Secondary | ICD-10-CM | POA: Diagnosis not present

## 2019-06-20 DIAGNOSIS — D631 Anemia in chronic kidney disease: Secondary | ICD-10-CM | POA: Diagnosis not present

## 2019-06-24 ENCOUNTER — Ambulatory Visit (INDEPENDENT_AMBULATORY_CARE_PROVIDER_SITE_OTHER): Payer: Medicare Other

## 2019-06-24 ENCOUNTER — Ambulatory Visit (INDEPENDENT_AMBULATORY_CARE_PROVIDER_SITE_OTHER): Payer: Medicare Other | Admitting: Podiatry

## 2019-06-24 ENCOUNTER — Other Ambulatory Visit: Payer: Self-pay

## 2019-06-24 DIAGNOSIS — L97521 Non-pressure chronic ulcer of other part of left foot limited to breakdown of skin: Secondary | ICD-10-CM

## 2019-06-24 DIAGNOSIS — M79672 Pain in left foot: Secondary | ICD-10-CM

## 2019-06-27 ENCOUNTER — Telehealth: Payer: Self-pay | Admitting: *Deleted

## 2019-06-27 NOTE — Telephone Encounter (Signed)
Pt needs same day appt with [facility] doctor as testing.

## 2019-07-07 ENCOUNTER — Ambulatory Visit (INDEPENDENT_AMBULATORY_CARE_PROVIDER_SITE_OTHER): Payer: Medicare Other | Admitting: Podiatry

## 2019-07-07 DIAGNOSIS — Z5329 Procedure and treatment not carried out because of patient's decision for other reasons: Secondary | ICD-10-CM

## 2019-07-07 NOTE — Progress Notes (Signed)
No show for appt. 

## 2019-07-14 ENCOUNTER — Other Ambulatory Visit: Payer: Self-pay

## 2019-07-14 DIAGNOSIS — I739 Peripheral vascular disease, unspecified: Secondary | ICD-10-CM

## 2019-07-21 ENCOUNTER — Ambulatory Visit (INDEPENDENT_AMBULATORY_CARE_PROVIDER_SITE_OTHER): Payer: Medicare Other | Admitting: Vascular Surgery

## 2019-07-21 ENCOUNTER — Other Ambulatory Visit: Payer: Self-pay

## 2019-07-21 ENCOUNTER — Other Ambulatory Visit: Payer: Self-pay | Admitting: *Deleted

## 2019-07-21 ENCOUNTER — Ambulatory Visit (INDEPENDENT_AMBULATORY_CARE_PROVIDER_SITE_OTHER)
Admission: RE | Admit: 2019-07-21 | Discharge: 2019-07-21 | Disposition: A | Discharge status: Home / Self Care | Payer: Medicare Other | Source: Ambulatory Visit | Attending: Vascular Surgery | Admitting: Vascular Surgery

## 2019-07-21 ENCOUNTER — Encounter: Payer: Self-pay | Admitting: Vascular Surgery

## 2019-07-21 ENCOUNTER — Ambulatory Visit (HOSPITAL_COMMUNITY)
Admission: RE | Admit: 2019-07-21 | Discharge: 2019-07-21 | Disposition: A | Discharge status: Home / Self Care | Payer: Medicare Other | Source: Ambulatory Visit | Attending: Vascular Surgery | Admitting: Vascular Surgery

## 2019-07-21 ENCOUNTER — Encounter: Payer: Self-pay | Admitting: *Deleted

## 2019-07-21 VITALS — BP 138/64 | HR 90 | Temp 97.6°F | Resp 20 | Ht 65.0 in | Wt 202.0 lb

## 2019-07-21 DIAGNOSIS — I739 Peripheral vascular disease, unspecified: Secondary | ICD-10-CM | POA: Diagnosis not present

## 2019-07-21 NOTE — Progress Notes (Signed)
Patient is a 68 year old female with known peripheral arterial disease.  She previously had an angioplasty of her left anterior tibial artery February 2020 to heal up a infected left first toe.  This eventually did heal.  She has now developed a new ulceration in the left second toe.  She is currently being followed at the wound center.  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  She has end-stage renal disease and dialyzes Monday Wednesday Friday.  She does not have any problems in her right foot.  Her previous arteriogram showed no significant aortoiliac or popliteal occlusive disease bilaterally.  She has chronic occlusion of the peroneal and posterior tibial arteries bilaterally.  Her only runoff vessel is the anterior tibial artery bilaterally.  Chronic medical problems include congestive heart failure, hypertension, neuropathy, diabetes.  These are all currently stable.  She is on Plavix and aspirin.  Past Medical History:  Diagnosis Date  . Anemia   . Anxiety   . Arthritis    "joints" (06/15/2013)  . Asthma   . 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 Blood transfusion without reported diagnosis   . Breast cancer ([place])    left  . CHF (congestive heart failure) ([place])   . 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 Esophageal ulcer with bleeding   . ESRD (end stage renal disease) ([place])    "suppose to start dialysis today" (06/15/2013)  . GERD (gastroesophageal reflux disease)   . Glaucoma    blind in both eyes  . Heart murmur   . Hx of adenomatous colonic polyps 04/07/2018  . Hypertension   . Myalgia 12/31/2011  . Neuropathy 12/31/2011  . Shortness of breath    "when she doesn't go to dialysis"  . Type II diabetes mellitus ([place])     Past Surgical History:  Procedure Laterality Date  . A/V FISTULAGRAM N/A 09/21/2018   Procedure: A/V FISTULAGRAM - Right Upper;  Surgeon: [name], MD;  Location: [place] [facility];  Service: Cardiovascular;  Laterality: N/A;   . ABDOMINAL AORTOGRAM N/A 11/30/2018   Procedure: ABDOMINAL AORTOGRAM;  Surgeon: [name], MD;  Location: [place] [facility];  Service: Cardiovascular;  Laterality: N/A;  . ABDOMINAL HYSTERECTOMY     partial  . AV FISTULA PLACEMENT Right 06/06/2013   Procedure: ARTERIOVENOUS (AV) FISTULA CREATION-RIGHT BRACHIAL CEPHALIC;  Surgeon: Conrad Shannon, MD;  Location: [place];  Service: Vascular;  Laterality: Right;  . BREAST BIOPSY Left   . BREAST LUMPECTOMY Left    "and took out some lymph nodes" (06/15/2013)  . CARPAL TUNNEL RELEASE Left 11/26/2017   Procedure: LEFT CARPAL TUNNEL RELEASE;  Surgeon: [name], MD;  Location: [place];  Service: Orthopedics;  Laterality: Left;  . CATARACT EXTRACTION W/ ANTERIOR VITRECTOMY Bilateral   . CESAREAN SECTION  1980  . COLONOSCOPY    . ESOPHAGOGASTRODUODENOSCOPY N/A 07/08/2017   Procedure: ESOPHAGOGASTRODUODENOSCOPY (EGD);  Surgeon: [name], MD;  Location: [facility] ENDOSCOPY;  Service: Endoscopy;  Laterality: N/A;  . ESOPHAGOGASTRODUODENOSCOPY (EGD) WITH PROPOFOL N/A 02/07/2018   Procedure: ESOPHAGOGASTRODUODENOSCOPY (EGD) WITH PROPOFOL;  Surgeon: [name], MD;  Location: [facility] ENDOSCOPY;  Service: Gastroenterology;  Laterality: N/A;  APC and clips placed  . EYE SURGERY Bilateral    laser surgery  . LOWER EXTREMITY ANGIOGRAPHY Bilateral 11/30/2018   Procedure: LOWER EXTREMITY ANGIOGRAPHY;  Surgeon: [name], MD;  Location: [place] [facility];  Service: Cardiovascular;  Laterality: Bilateral;  .  PARS PLANA VITRECTOMY Right 05/05/2017   Procedure: PARS PLANA VITRECTOMY WITH 25 GAUGE; PARTIAL REMOVAL OF OIL; INFERIOR PERIPHERAL IRIDECTOMY, REFORM ANTERIOR CHAMBER RIGHT EYE;  Surgeon: [name], MD;  Location: [place];  Service: Ophthalmology;  Laterality: Right;  . PERIPHERAL VASCULAR BALLOON ANGIOPLASTY Right 09/21/2018   Procedure: PERIPHERAL VASCULAR BALLOON ANGIOPLASTY;  Surgeon: [name], MD;  Location: [place] [facility];  Service: Cardiovascular;  Laterality: Right;  AV fistula  . PERIPHERAL VASCULAR BALLOON ANGIOPLASTY Left 11/30/2018   Procedure: PERIPHERAL VASCULAR BALLOON ANGIOPLASTY;  Surgeon: [name], MD;  Location: [place] [facility];  Service: Cardiovascular;  Laterality: Left;  Left Anterior Tibial Artery  . REFRACTIVE SURGERY Bilateral   . REMOVAL OF A DIALYSIS CATHETER Right 06/06/2013   Procedure: REMOVAL OF RIGHT MEDIPORT;  Surgeon: Conrad Dahlgren, MD;  Location: [place];  Service: Vascular;  Laterality: Right;  . TONSILLECTOMY    . UPPER GASTROINTESTINAL ENDOSCOPY      Current Outpatient Medications on File Prior to Visit  Medication Sig Dispense Refill  . acetaminophen (TYLENOL) 500 MG tablet Take 500 mg by mouth 2 (two) times a day.     . albuterol (PROVENTIL) (2.5 MG/3ML) 0.083% nebulizer solution Take 3 mLs (2.5 mg total) by nebulization every 6 (six) hours as needed for wheezing or shortness of breath. 75 mL 12  . Alcohol Swabs (B-D SINGLE USE SWABS REGULAR) PADS USE UTD FOR INSULIN INJECTIONS TID    . amLODipine (NORVASC) 5 MG tablet Take 5 mg by mouth daily.     Marland Kitchen amoxicillin-clavulanate (AUGMENTIN) 500-125 MG tablet Take 1 tablet by mouth qd on Tuesday, Thursday and Saturday. 10 tablet 0  . aspirin 81 MG chewable tablet Chew 81 mg by mouth daily.     Marland Kitchen atropine 1 % ophthalmic solution Place 1 drop into the right eye 2 (two) times daily.  0  . B Complex-C-Folic Acid (RENA-VITE RX) 1 MG TABS Take 1 tablet by mouth daily with lunch.   1  . BD PEN NEEDLE NANO 2ND GEN 32G X 4 MM MISC USE WITH INSULIN PEN 3 TIMES A DAY    . carvedilol (COREG) 25 MG tablet Take 25 mg by mouth 2 (two) times daily.  2  . clopidogrel (PLAVIX) 75 MG tablet Take 1 tablet (75 mg total) by mouth daily. 30 tablet 11  . darbepoetin (ARANESP) 200 MCG/0.4ML SOLN injection Inject 0.4 mLs (200 mcg total) into the vein every Wednesday with hemodialysis. 1.68 mL   . diclofenac sodium (VOLTAREN) 1 % GEL APP EXT AA UP  TO QID    . docusate sodium (COLACE) 100 MG capsule Take 100 mg by mouth daily.    Marland Kitchen doxercalciferol (HECTOROL) 4 MCG/2ML injection Inject 0.5 mLs (1 mcg total) into the vein every Monday, Wednesday, and Friday with hemodialysis. 2 mL   . DULoxetine (CYMBALTA) 30 MG capsule TK 1 C PO QD    . fluticasone (FLONASE) 50 MCG/ACT nasal spray SHAKE LQ AND U 2 SPRAYS IEN QD    . gabapentin (NEURONTIN) 100 MG capsule Take 100 mg by mouth 2 (two) times daily.    . insulin aspart (NOVOLOG FLEXPEN) 100 UNIT/ML FlexPen Inject 6-10 Units into the skin 3 (three) times daily with meals. per Sliding scale    . lanthanum (FOSRENOL) 1000 MG chewable tablet Chew 2,000 mg by mouth 3 (three) times daily with meals.     Marland Kitchen lisinopril (PRINIVIL,ZESTRIL) 40 MG tablet Take 40 mg by mouth  at bedtime.     Marland Kitchen omeprazole (PRILOSEC) 40 MG capsule Take 1 capsule (40 mg total) by mouth daily. (Patient taking differently: Take 40 mg by mouth 2 (two) times daily. )    . ONETOUCH VERIO test strip 3 (three) times daily. for testing  9  . pantoprazole (PROTONIX) 40 MG tablet Take by mouth.    . prednisoLONE acetate (PRED FORTE) 1 % ophthalmic suspension Place 1 drop into the right eye 2 (two) times daily.   0  . Sennosides (SENNA) 8.8 MG/5ML SYRP Take by mouth.    . traMADol (ULTRAM) 50 MG tablet Take by mouth.     Current Facility-Administered Medications on File Prior to Visit  Medication Dose Route Frequency Provider Last Rate Last Admin  . 0.9 %  sodium chloride infusion  500 mL Intravenous Once [name], MD      . dexamethasone (DECADRON) injection 4 mg  4 mg Intramuscular Once Hilts, Michael, MD         Review of systems: She does not have shortness of breath.  She does not have chest pain.  However, she is minimally ambulatory.  Physical exam:  Vitals:   07/21/19 1426  BP: 138/64  Pulse: 90  Resp: 20  Temp: 97.6 F (36.4 C)  SpO2: 98%  Weight: 202 lb (91.6 kg)  Height: 5\' 5"  (1.651 m)    Extremities: No  palpable pedal pulses bilaterally  Skin: Ulceration with skin necrosis nearly down to the bone on the medial aspect of the left second toe.  There is also a small darkened area at the tip of the left third toe which is a healing ulcer but also has some characteristics consistent with dry gangrene  Data: Patient had an arterial duplex exam today which showed occlusion of her posterior tibial artery bilaterally.  The left anterior tibial artery had biphasic flow proximally but monophasic distally.  The right anterior tibial artery had monophasic flow diffusely.  Toe pressure on the left was 18.  Toe pressure on the right side was 84.  I reviewed and interpreted the studies.  Assessment: Severe tibial artery occlusive disease now with new nonhealing wound left foot.  Patient is certainly at risk of limb loss.  Most likely she is going to at least require amputation of the left second toe.  Plan: Aortogram lower extremity runoff possible intervention on July 28, 2018.  We will switch her dialysis day to accommodate this.  I discussed with the patient and her sister today that she will most likely require amputation of her left second toe but hopefully not lose her entire foot.  Procedure details risk benefits possible complications of arteriography and intervention were discussed with patient and her sister today.  They understand and agree to proceed.  Ruta Hinds, MD [facility] [place] Office: [phone]

## 2019-07-21 NOTE — H&P (View-Only) (Signed)
Patient is a 68 year old female with known peripheral arterial disease.  She previously had an angioplasty of her left anterior tibial artery February 2020 to heal up a infected left first toe.  This eventually did heal.  She has now developed a new ulceration in the left second toe.  She is currently being followed at the wound center.  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  She has end-stage renal disease and dialyzes Monday Wednesday Friday.  She does not have any problems in her right foot.  Her previous arteriogram showed no significant aortoiliac or popliteal occlusive disease bilaterally.  She has chronic occlusion of the peroneal and posterior tibial arteries bilaterally.  Her only runoff vessel is the anterior tibial artery bilaterally.  Chronic medical problems include congestive heart failure, hypertension, neuropathy, diabetes.  These are all currently stable.  She is on Plavix and aspirin.  Past Medical History:  Diagnosis Date  . Anemia   . Anxiety   . Arthritis    "joints" (06/15/2013)  . Asthma   . 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 Blood transfusion without reported diagnosis   . Breast cancer ([place])    left  . CHF (congestive heart failure) ([place])   . 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 Esophageal ulcer with bleeding   . ESRD (end stage renal disease) ([place])    "suppose to start dialysis today" (06/15/2013)  . GERD (gastroesophageal reflux disease)   . Glaucoma    blind in both eyes  . Heart murmur   . Hx of adenomatous colonic polyps 04/07/2018  . Hypertension   . Myalgia 12/31/2011  . Neuropathy 12/31/2011  . Shortness of breath    "when she doesn't go to dialysis"  . Type II diabetes mellitus ([place])     Past Surgical History:  Procedure Laterality Date  . A/V FISTULAGRAM N/A 09/21/2018   Procedure: A/V FISTULAGRAM - Right Upper;  Surgeon: [name], MD;  Location: [place] [facility];  Service: Cardiovascular;  Laterality: N/A;   . ABDOMINAL AORTOGRAM N/A 11/30/2018   Procedure: ABDOMINAL AORTOGRAM;  Surgeon: [name], MD;  Location: [place] [facility];  Service: Cardiovascular;  Laterality: N/A;  . ABDOMINAL HYSTERECTOMY     partial  . AV FISTULA PLACEMENT Right 06/06/2013   Procedure: ARTERIOVENOUS (AV) FISTULA CREATION-RIGHT BRACHIAL CEPHALIC;  Surgeon: Conrad [place], MD;  Location: [place];  Service: Vascular;  Laterality: Right;  . BREAST BIOPSY Left   . BREAST LUMPECTOMY Left    "and took out some lymph nodes" (06/15/2013)  . CARPAL TUNNEL RELEASE Left 11/26/2017   Procedure: LEFT CARPAL TUNNEL RELEASE;  Surgeon: [name], MD;  Location: [place];  Service: Orthopedics;  Laterality: Left;  . CATARACT EXTRACTION W/ ANTERIOR VITRECTOMY Bilateral   . CESAREAN SECTION  1980  . COLONOSCOPY    . ESOPHAGOGASTRODUODENOSCOPY N/A 07/08/2017   Procedure: ESOPHAGOGASTRODUODENOSCOPY (EGD);  Surgeon: [name], MD;  Location: [facility] ENDOSCOPY;  Service: Endoscopy;  Laterality: N/A;  . ESOPHAGOGASTRODUODENOSCOPY (EGD) WITH PROPOFOL N/A 02/07/2018   Procedure: ESOPHAGOGASTRODUODENOSCOPY (EGD) WITH PROPOFOL;  Surgeon: [name], MD;  Location: [facility] ENDOSCOPY;  Service: Gastroenterology;  Laterality: N/A;  APC and clips placed  . EYE SURGERY Bilateral    laser surgery  . LOWER EXTREMITY ANGIOGRAPHY Bilateral 11/30/2018   Procedure: LOWER EXTREMITY ANGIOGRAPHY;  Surgeon: [name], MD;  Location: [place] [facility];  Service: Cardiovascular;  Laterality: Bilateral;  .  PARS PLANA VITRECTOMY Right 05/05/2017   Procedure: PARS PLANA VITRECTOMY WITH 25 GAUGE; PARTIAL REMOVAL OF OIL; INFERIOR PERIPHERAL IRIDECTOMY, REFORM ANTERIOR CHAMBER RIGHT EYE;  Surgeon: [name], MD;  Location: [place];  Service: Ophthalmology;  Laterality: Right;  . PERIPHERAL VASCULAR BALLOON ANGIOPLASTY Right 09/21/2018   Procedure: PERIPHERAL VASCULAR BALLOON ANGIOPLASTY;  Surgeon: [name], MD;  Location: [place] [facility];  Service: Cardiovascular;  Laterality: Right;  AV fistula  . PERIPHERAL VASCULAR BALLOON ANGIOPLASTY Left 11/30/2018   Procedure: PERIPHERAL VASCULAR BALLOON ANGIOPLASTY;  Surgeon: [name], MD;  Location: [place] [facility];  Service: Cardiovascular;  Laterality: Left;  Left Anterior Tibial Artery  . REFRACTIVE SURGERY Bilateral   . REMOVAL OF A DIALYSIS CATHETER Right 06/06/2013   Procedure: REMOVAL OF RIGHT MEDIPORT;  Surgeon: Conrad [place], MD;  Location: [place];  Service: Vascular;  Laterality: Right;  . TONSILLECTOMY    . UPPER GASTROINTESTINAL ENDOSCOPY      Current Outpatient Medications on File Prior to Visit  Medication Sig Dispense Refill  . acetaminophen (TYLENOL) 500 MG tablet Take 500 mg by mouth 2 (two) times a day.     . albuterol (PROVENTIL) (2.5 MG/3ML) 0.083% nebulizer solution Take 3 mLs (2.5 mg total) by nebulization every 6 (six) hours as needed for wheezing or shortness of breath. 75 mL 12  . Alcohol Swabs (B-D SINGLE USE SWABS REGULAR) PADS USE UTD FOR INSULIN INJECTIONS TID    . amLODipine (NORVASC) 5 MG tablet Take 5 mg by mouth daily.     Marland Kitchen amoxicillin-clavulanate (AUGMENTIN) 500-125 MG tablet Take 1 tablet by mouth qd on Tuesday, Thursday and Saturday. 10 tablet 0  . aspirin 81 MG chewable tablet Chew 81 mg by mouth daily.     Marland Kitchen atropine 1 % ophthalmic solution Place 1 drop into the right eye 2 (two) times daily.  0  . B Complex-C-Folic Acid (RENA-VITE RX) 1 MG TABS Take 1 tablet by mouth daily with lunch.   1  . BD PEN NEEDLE NANO 2ND GEN 32G X 4 MM MISC USE WITH INSULIN PEN 3 TIMES A DAY    . carvedilol (COREG) 25 MG tablet Take 25 mg by mouth 2 (two) times daily.  2  . clopidogrel (PLAVIX) 75 MG tablet Take 1 tablet (75 mg total) by mouth daily. 30 tablet 11  . darbepoetin (ARANESP) 200 MCG/0.4ML SOLN injection Inject 0.4 mLs (200 mcg total) into the vein every Wednesday with hemodialysis. 1.68 mL   . diclofenac sodium (VOLTAREN) 1 % GEL APP EXT AA UP  TO QID    . docusate sodium (COLACE) 100 MG capsule Take 100 mg by mouth daily.    Marland Kitchen doxercalciferol (HECTOROL) 4 MCG/2ML injection Inject 0.5 mLs (1 mcg total) into the vein every Monday, Wednesday, and Friday with hemodialysis. 2 mL   . DULoxetine (CYMBALTA) 30 MG capsule TK 1 C PO QD    . fluticasone (FLONASE) 50 MCG/ACT nasal spray SHAKE LQ AND U 2 SPRAYS IEN QD    . gabapentin (NEURONTIN) 100 MG capsule Take 100 mg by mouth 2 (two) times daily.    . insulin aspart (NOVOLOG FLEXPEN) 100 UNIT/ML FlexPen Inject 6-10 Units into the skin 3 (three) times daily with meals. per Sliding scale    . lanthanum (FOSRENOL) 1000 MG chewable tablet Chew 2,000 mg by mouth 3 (three) times daily with meals.     Marland Kitchen lisinopril (PRINIVIL,ZESTRIL) 40 MG tablet Take 40 mg by mouth  at bedtime.     Marland Kitchen omeprazole (PRILOSEC) 40 MG capsule Take 1 capsule (40 mg total) by mouth daily. (Patient taking differently: Take 40 mg by mouth 2 (two) times daily. )    . ONETOUCH VERIO test strip 3 (three) times daily. for testing  9  . pantoprazole (PROTONIX) 40 MG tablet Take by mouth.    . prednisoLONE acetate (PRED FORTE) 1 % ophthalmic suspension Place 1 drop into the right eye 2 (two) times daily.   0  . Sennosides (SENNA) 8.8 MG/5ML SYRP Take by mouth.    . traMADol (ULTRAM) 50 MG tablet Take by mouth.     Current Facility-Administered Medications on File Prior to Visit  Medication Dose Route Frequency Provider Last Rate Last Admin  . 0.9 %  sodium chloride infusion  500 mL Intravenous Once [name], MD      . dexamethasone (DECADRON) injection 4 mg  4 mg Intramuscular Once Hilts, Michael, MD         Review of systems: She does not have shortness of breath.  She does not have chest pain.  However, she is minimally ambulatory.  Physical exam:  Vitals:   07/21/19 1426  BP: 138/64  Pulse: 90  Resp: 20  Temp: 97.6 F (36.4 C)  SpO2: 98%  Weight: 202 lb (91.6 kg)  Height: 5\' 5"  (1.651 m)    Extremities: No  palpable pedal pulses bilaterally  Skin: Ulceration with skin necrosis nearly down to the bone on the medial aspect of the left second toe.  There is also a small darkened area at the tip of the left third toe which is a healing ulcer but also has some characteristics consistent with dry gangrene  Data: Patient had an arterial duplex exam today which showed occlusion of her posterior tibial artery bilaterally.  The left anterior tibial artery had biphasic flow proximally but monophasic distally.  The right anterior tibial artery had monophasic flow diffusely.  Toe pressure on the left was 18.  Toe pressure on the right side was 84.  I reviewed and interpreted the studies.  Assessment: Severe tibial artery occlusive disease now with new nonhealing wound left foot.  Patient is certainly at risk of limb loss.  Most likely she is going to at least require amputation of the left second toe.  Plan: Aortogram lower extremity runoff possible intervention on July 28, 2018.  We will switch her dialysis day to accommodate this.  I discussed with the patient and her sister today that she will most likely require amputation of her left second toe but hopefully not lose her entire foot.  Procedure details risk benefits possible complications of arteriography and intervention were discussed with patient and her sister today.  They understand and agree to proceed.  Ruta Hinds, MD [facility] [place] Office: [phone]

## 2019-07-21 NOTE — Progress Notes (Signed)
Spoke with Webb Silversmith at [facility] to change HD days to accommodate procedure on Friday 07/29/2019. They will give schedule to patient when she is in next time.

## 2019-07-22 DIAGNOSIS — Z992 Dependence on renal dialysis: Secondary | ICD-10-CM | POA: Diagnosis not present

## 2019-07-22 DIAGNOSIS — E1129 Type 2 diabetes mellitus with other diabetic kidney complication: Secondary | ICD-10-CM | POA: Diagnosis not present

## 2019-07-22 DIAGNOSIS — N186 End stage renal disease: Secondary | ICD-10-CM | POA: Diagnosis not present

## 2019-07-23 DIAGNOSIS — D631 Anemia in chronic kidney disease: Secondary | ICD-10-CM | POA: Diagnosis not present

## 2019-07-23 DIAGNOSIS — D509 Iron deficiency anemia, unspecified: Secondary | ICD-10-CM | POA: Diagnosis not present

## 2019-07-23 DIAGNOSIS — N2581 Secondary hyperparathyroidism of renal origin: Secondary | ICD-10-CM | POA: Diagnosis not present

## 2019-07-23 DIAGNOSIS — N186 End stage renal disease: Secondary | ICD-10-CM | POA: Diagnosis not present

## 2019-07-23 DIAGNOSIS — Z992 Dependence on renal dialysis: Secondary | ICD-10-CM | POA: Diagnosis not present

## 2019-07-25 DIAGNOSIS — N186 End stage renal disease: Secondary | ICD-10-CM | POA: Diagnosis not present

## 2019-07-25 DIAGNOSIS — N2581 Secondary hyperparathyroidism of renal origin: Secondary | ICD-10-CM | POA: Diagnosis not present

## 2019-07-25 DIAGNOSIS — D509 Iron deficiency anemia, unspecified: Secondary | ICD-10-CM | POA: Diagnosis not present

## 2019-07-25 DIAGNOSIS — Z992 Dependence on renal dialysis: Secondary | ICD-10-CM | POA: Diagnosis not present

## 2019-07-25 DIAGNOSIS — D631 Anemia in chronic kidney disease: Secondary | ICD-10-CM | POA: Diagnosis not present

## 2019-07-26 ENCOUNTER — Other Ambulatory Visit (HOSPITAL_COMMUNITY)
Admission: RE | Admit: 2019-07-26 | Discharge: 2019-07-26 | Disposition: A | Discharge status: Home / Self Care | Payer: Medicare Other | Source: Ambulatory Visit | Attending: Vascular Surgery | Admitting: Vascular Surgery

## 2019-07-26 DIAGNOSIS — Z20822 Contact with and (suspected) exposure to covid-19: Secondary | ICD-10-CM | POA: Insufficient documentation

## 2019-07-26 DIAGNOSIS — Z01812 Encounter for preprocedural laboratory examination: Secondary | ICD-10-CM | POA: Diagnosis not present

## 2019-07-27 DIAGNOSIS — N186 End stage renal disease: Secondary | ICD-10-CM | POA: Diagnosis not present

## 2019-07-27 DIAGNOSIS — D509 Iron deficiency anemia, unspecified: Secondary | ICD-10-CM | POA: Diagnosis not present

## 2019-07-27 DIAGNOSIS — N2581 Secondary hyperparathyroidism of renal origin: Secondary | ICD-10-CM | POA: Diagnosis not present

## 2019-07-27 DIAGNOSIS — Z992 Dependence on renal dialysis: Secondary | ICD-10-CM | POA: Diagnosis not present

## 2019-07-27 DIAGNOSIS — E1142 Type 2 diabetes mellitus with diabetic polyneuropathy: Secondary | ICD-10-CM | POA: Diagnosis not present

## 2019-07-27 DIAGNOSIS — D631 Anemia in chronic kidney disease: Secondary | ICD-10-CM | POA: Diagnosis not present

## 2019-07-28 DIAGNOSIS — K922 Gastrointestinal hemorrhage, unspecified: Secondary | ICD-10-CM | POA: Diagnosis not present

## 2019-07-28 DIAGNOSIS — E1129 Type 2 diabetes mellitus with other diabetic kidney complication: Secondary | ICD-10-CM | POA: Diagnosis not present

## 2019-07-28 DIAGNOSIS — I739 Peripheral vascular disease, unspecified: Secondary | ICD-10-CM | POA: Diagnosis not present

## 2019-07-28 DIAGNOSIS — G609 Hereditary and idiopathic neuropathy, unspecified: Secondary | ICD-10-CM | POA: Diagnosis not present

## 2019-07-28 DIAGNOSIS — H540X55 Blindness right eye category 5, blindness left eye category 5: Secondary | ICD-10-CM | POA: Diagnosis not present

## 2019-07-28 DIAGNOSIS — N186 End stage renal disease: Secondary | ICD-10-CM | POA: Diagnosis not present

## 2019-07-28 LAB — NOVEL CORONAVIRUS, NAA (HOSP ORDER, SEND-OUT TO REF LAB; TAT 18-24 HRS): SARS-CoV-2, NAA: NOT DETECTED

## 2019-07-29 ENCOUNTER — Other Ambulatory Visit: Payer: Self-pay

## 2019-07-29 ENCOUNTER — Ambulatory Visit (HOSPITAL_COMMUNITY)
Admission: RE | Admit: 2019-07-29 | Discharge: 2019-07-29 | Disposition: A | Discharge status: Home / Self Care | Payer: Medicare Other | Attending: Vascular Surgery | Admitting: Vascular Surgery

## 2019-07-29 ENCOUNTER — Encounter (HOSPITAL_COMMUNITY)
Admission: RE | Disposition: A | Discharge status: Home / Self Care | Payer: Self-pay | Source: Home / Self Care | Attending: Vascular Surgery

## 2019-07-29 DIAGNOSIS — E11621 Type 2 diabetes mellitus with foot ulcer: Secondary | ICD-10-CM | POA: Diagnosis not present

## 2019-07-29 DIAGNOSIS — Z7902 Long term (current) use of antithrombotics/antiplatelets: Secondary | ICD-10-CM | POA: Insufficient documentation

## 2019-07-29 DIAGNOSIS — Z7982 Long term (current) use of aspirin: Secondary | ICD-10-CM | POA: Diagnosis not present

## 2019-07-29 DIAGNOSIS — K219 Gastro-esophageal reflux disease without esophagitis: Secondary | ICD-10-CM | POA: Insufficient documentation

## 2019-07-29 DIAGNOSIS — Z992 Dependence on renal dialysis: Secondary | ICD-10-CM | POA: Insufficient documentation

## 2019-07-29 DIAGNOSIS — H548 Legal blindness, as defined in USA: Secondary | ICD-10-CM | POA: Diagnosis not present

## 2019-07-29 DIAGNOSIS — I70245 Atherosclerosis of native arteries of left leg with ulceration of other part of foot: Secondary | ICD-10-CM | POA: Diagnosis not present

## 2019-07-29 DIAGNOSIS — M1990 Unspecified osteoarthritis, unspecified site: Secondary | ICD-10-CM | POA: Diagnosis not present

## 2019-07-29 DIAGNOSIS — Z79899 Other long term (current) drug therapy: Secondary | ICD-10-CM | POA: Diagnosis not present

## 2019-07-29 DIAGNOSIS — E1151 Type 2 diabetes mellitus with diabetic peripheral angiopathy without gangrene: Secondary | ICD-10-CM | POA: Diagnosis not present

## 2019-07-29 DIAGNOSIS — E1122 Type 2 diabetes mellitus with diabetic chronic kidney disease: Secondary | ICD-10-CM | POA: Diagnosis not present

## 2019-07-29 DIAGNOSIS — Z853 Personal history of malignant neoplasm of breast: Secondary | ICD-10-CM | POA: Diagnosis not present

## 2019-07-29 DIAGNOSIS — N186 End stage renal disease: Secondary | ICD-10-CM | POA: Insufficient documentation

## 2019-07-29 DIAGNOSIS — F419 Anxiety disorder, unspecified: Secondary | ICD-10-CM | POA: Insufficient documentation

## 2019-07-29 DIAGNOSIS — I509 Heart failure, unspecified: Secondary | ICD-10-CM | POA: Diagnosis not present

## 2019-07-29 DIAGNOSIS — I132 Hypertensive heart and chronic kidney disease with heart failure and with stage 5 chronic kidney disease, or end stage renal disease: Secondary | ICD-10-CM | POA: Diagnosis not present

## 2019-07-29 DIAGNOSIS — Z794 Long term (current) use of insulin: Secondary | ICD-10-CM | POA: Insufficient documentation

## 2019-07-29 DIAGNOSIS — J45909 Unspecified asthma, uncomplicated: Secondary | ICD-10-CM | POA: Diagnosis not present

## 2019-07-29 DIAGNOSIS — L97529 Non-pressure chronic ulcer of other part of left foot with unspecified severity: Secondary | ICD-10-CM | POA: Insufficient documentation

## 2019-07-29 HISTORY — PX: ABDOMINAL AORTOGRAM W/LOWER EXTREMITY: CATH118223

## 2019-07-29 LAB — POCT I-STAT, CHEM 8
BUN: 29 mg/dL — ABNORMAL HIGH (ref 8–23)
Calcium, Ion: 1.13 mmol/L — ABNORMAL LOW (ref 1.15–1.40)
Chloride: 96 mmol/L — ABNORMAL LOW (ref 98–111)
Creatinine, Ser: 9.2 mg/dL — ABNORMAL HIGH (ref 0.44–1.00)
Glucose, Bld: 125 mg/dL — ABNORMAL HIGH (ref 70–99)
HCT: 34 % — ABNORMAL LOW (ref 36.0–46.0)
Hemoglobin: 11.6 g/dL — ABNORMAL LOW (ref 12.0–15.0)
Potassium: 4.2 mmol/L (ref 3.5–5.1)
Sodium: 138 mmol/L (ref 135–145)
TCO2: 30 mmol/L (ref 22–32)

## 2019-07-29 LAB — GLUCOSE, CAPILLARY: Glucose-Capillary: 100 mg/dL — ABNORMAL HIGH (ref 70–99)

## 2019-07-29 SURGERY — ABDOMINAL AORTOGRAM W/LOWER EXTREMITY
Anesthesia: LOCAL | Laterality: Left

## 2019-07-29 MED ORDER — SODIUM CHLORIDE 0.9% FLUSH: 3.0000 mL | Freq: Two times a day (BID) | INTRAVENOUS | Status: DC

## 2019-07-29 MED ORDER — IODIXANOL 320 MG/ML IV SOLN
INTRAVENOUS | Status: DC | PRN
  Administered 2019-07-29: 13:00:00 70 mL

## 2019-07-29 MED ORDER — SODIUM CHLORIDE 0.9 % IV SOLN: 250.0000 mL | INTRAVENOUS | Status: DC | PRN

## 2019-07-29 MED ORDER — LIDOCAINE HCL (PF) 1 % IJ SOLN
INTRAMUSCULAR | Status: AC
  Filled 2019-07-29: qty 30

## 2019-07-29 MED ORDER — HYDRALAZINE HCL 20 MG/ML IJ SOLN: 5.0000 mg | INTRAMUSCULAR | Status: DC | PRN

## 2019-07-29 MED ORDER — SODIUM CHLORIDE 0.9% FLUSH: 3.0000 mL | INTRAVENOUS | Status: DC | PRN

## 2019-07-29 MED ORDER — ACETAMINOPHEN 325 MG PO TABS: 650.0000 mg | ORAL_TABLET | ORAL | Status: DC | PRN

## 2019-07-29 MED ORDER — LABETALOL HCL 5 MG/ML IV SOLN: 10.0000 mg | INTRAVENOUS | Status: DC | PRN

## 2019-07-29 MED ORDER — SODIUM CHLORIDE 0.9 % IV SOLN: INTRAVENOUS | Status: AC

## 2019-07-29 MED ORDER — HEPARIN (PORCINE) IN NACL 1000-0.9 UT/500ML-% IV SOLN
INTRAVENOUS | Status: DC | PRN
  Administered 2019-07-29 (×2): 500 mL

## 2019-07-29 MED ORDER — ONDANSETRON HCL 4 MG/2ML IJ SOLN: 4.0000 mg | Freq: Four times a day (QID) | INTRAMUSCULAR | Status: DC | PRN

## 2019-07-29 MED ORDER — LIDOCAINE HCL (PF) 1 % IJ SOLN
INTRAMUSCULAR | Status: DC | PRN
  Administered 2019-07-29: 20 mL

## 2019-07-29 MED ORDER — HEPARIN (PORCINE) IN NACL 1000-0.9 UT/500ML-% IV SOLN
INTRAVENOUS | Status: AC
  Filled 2019-07-29: qty 1000

## 2019-07-29 SURGICAL SUPPLY — 10 items
CATH ANGIO 5F PIGTAIL 65CM (CATHETERS) ×1 IMPLANT
CATH CROSS OVER TEMPO 5F (CATHETERS) ×1 IMPLANT
CATH STRAIGHT 5FR 65CM (CATHETERS) ×2 IMPLANT
KIT PV (KITS) ×2 IMPLANT
SHEATH PINNACLE 5F 10CM (SHEATH) ×2 IMPLANT
SHEATH PROBE COVER 6X72 (BAG) ×1 IMPLANT
SYR MEDRAD MARK V 150ML (SYRINGE) ×1 IMPLANT
TRANSDUCER W/STOPCOCK (MISCELLANEOUS) ×2 IMPLANT
TRAY PV CATH (CUSTOM PROCEDURE TRAY) ×2 IMPLANT
WIRE BENTSON .035X145CM (WIRE) ×2 IMPLANT

## 2019-07-29 NOTE — Op Note (Signed)
Procedure: Abdominal aortogram with left lower extremity runoff  Preoperative diagnosis: Nonhealing wound left foot  Postoperative diagnosis: Same  Anesthesia: Local  Operative findings: Severe unreconstructable tibial artery occlusive disease left leg  Operative details: After obtaining form consent, the patient taken the PV lab.  The patient was placed in supine position on the angio table.  Both groins were prepped draped usual sterile fashion.  Local anesthesia was infiltrated of the right common femoral artery.  Ultrasound was used to identify the right common femoral artery and femoral bifurcation.  Using ultrasound guidance the right common femoral artery was cannulated with an introducer needle and an 035 versa core wire threaded up the abdominal aorta under fluoroscopic guidance.  Next a 5 [place] dilator was placed over the guidewire in the right common femoral artery and this was thoroughly flushed with heparinized saline.  5 French pigtail catheter was advanced over the guidewire into the abdominal aorta.  Abdominal aortogram was obtained in AP projection.  Infrarenal abdominal aorta has no significant flow-limiting stenosis.  Left and right common external and internal iliac arteries are all widely patent.  At this point pigtail catheter was removed over guidewire and replaced with a 5 [place] crossover catheter.  This was used to selectively catheterize the left common iliac artery and the guidewire advanced down to into the left common femoral artery.  Crossover catheter was then swapped out for a 5 French straight catheter.  Left lower extremity arteriogram was then performed.  Left common femoral profundofemoral superficial femoral arteries are calcified but all widely patent.  Left popliteal artery is patent.  The origins of the anterior tibial and peroneal artery are open but both of these vessels occluded in the mid leg.  The posterior tibial artery is occluded shortly after its origin.   Lateral foot view was obtained which shows a diminutive dorsalis pedis artery too small for vascular reconstruction.  At this point the 5 French straight catheter was removed and a 5 French sheath left in place to be pulled the holding area.  Operative management: The patient has no further options for revascularization in her left leg.  She was offered today continued local wound care versus a toe amputation which I suspect will not heal.  Other option would be a left below-knee amputation.  This was discussed with the patient and her family.  They will call us back in the future if she wished to do a left below-knee amputation.  Cathy Hinds, MD [facility] [place] Office: [phone]

## 2019-07-29 NOTE — Discharge Instructions (Signed)
Femoral Site Care This sheet gives you information about how to care for yourself after your procedure. Your health care provider may also give you more specific instructions. If you have problems or questions, contact your health care provider. What can I expect after the procedure? After the procedure, it is common to have:  Bruising that usually fades within 1-2 weeks.  Tenderness at the site. Follow these instructions at home: Wound care  Follow instructions from your health care provider about how to take care of your insertion site. Make sure you: ? Wash your hands with soap and water before you change your bandage (dressing). If soap and water are not available, use hand sanitizer. ? Change your dressing as told by your health care provider. ? Leave stitches (sutures), skin glue, or adhesive strips in place. These skin closures may need to stay in place for 2 weeks or longer. If adhesive strip edges start to loosen and curl up, you may trim the loose edges. Do not remove adhesive strips completely unless your health care provider tells you to do that.  Do not take baths, swim, or use a hot tub until your health care provider approves.  You may shower 24-48 hours after the procedure or as told by your health care provider. ? Gently wash the site with plain soap and water. ? Pat the area dry with a clean towel. ? Do not rub the site. This may cause bleeding.  Do not apply powder or lotion to the site. Keep the site clean and dry.  Check your femoral site every day for signs of infection. Check for: ? Redness, swelling, or pain. ? Fluid or blood. ? Warmth. ? Pus or a bad smell. Activity  For the first 2-3 days after your procedure, or as long as directed: ? Avoid climbing stairs as much as possible. ? Do not squat.  Do not lift anything that is heavier than 10 lb (4.5 kg), or the limit that you are told, until your health care provider says that it is safe.  Rest as  directed. ? Avoid sitting for a long time without moving. Get up to take short walks every 1-2 hours.  Do not drive for 24 hours if you were given a medicine to help you relax (sedative). General instructions  Take over-the-counter and prescription medicines only as told by your health care provider.  Keep all follow-up visits as told by your health care provider. This is important. Contact a health care provider if you have:  A fever or chills.  You have redness, swelling, or pain around your insertion site. Get help right away if:  The catheter insertion area swells very fast.  You pass out.  You suddenly start to sweat or your skin gets clammy.  The catheter insertion area is bleeding, and the bleeding does not stop when you hold steady pressure on the area.  The area near or just beyond the catheter insertion site becomes pale, cool, tingly, or numb. These symptoms may represent a serious problem that is an emergency. Do not wait to see if the symptoms will go away. Get medical help right away. Call your local emergency services (911 in the [place] Do not drive yourself to the hospital. Summary  After the procedure, it is common to have bruising that usually fades within 1-2 weeks.  Check your femoral site every day for signs of infection.  Do not lift anything that is heavier than 10 lb (4.5 kg), or the   limit that you are told, until your health care provider says that it is safe. This information is not intended to replace advice given to you by your health care provider. Make sure you discuss any questions you have with your health care provider. Document Revised: 07/20/2017 Document Reviewed: 07/20/2017 Elsevier Patient Education  2020 [employer]  

## 2019-07-29 NOTE — Interval H&P Note (Signed)
History and Physical Interval Note:  07/29/2019 9:51 AM  [name]  has presented today for surgery, with the diagnosis of PAD.  The various methods of treatment have been discussed with the patient and family. After consideration of risks, benefits and other options for treatment, the patient has consented to  Procedure(s): ABDOMINAL AORTOGRAM W/LOWER EXTREMITY (Bilateral) as a surgical intervention.  The patient's history has been reviewed, patient examined, no change in status, stable for surgery.  I have reviewed the patient's chart and labs.  Questions were answered to the patient's satisfaction.     Ruta Hinds

## 2019-07-29 NOTE — Progress Notes (Signed)
Site area- right ° °Site Prior to Removal- 0 °  °Pressure Applied For-  20 MInutes °  °Bedrest Beginning at - 1345 °  °Manual- Yes °  °Patient Status During Pull- Stable  °  °Post Pull Groin Site- 0 °  °Post Pull Instructions Given- Yes °  °Post Pull Pulses Present- Yes  °  °Dressing Applied- Tegaderm and Gauze Dressing  °  °Comments:    °  °  °  °  °

## 2019-07-30 DIAGNOSIS — N186 End stage renal disease: Secondary | ICD-10-CM | POA: Diagnosis not present

## 2019-07-30 DIAGNOSIS — D631 Anemia in chronic kidney disease: Secondary | ICD-10-CM | POA: Diagnosis not present

## 2019-07-30 DIAGNOSIS — D509 Iron deficiency anemia, unspecified: Secondary | ICD-10-CM | POA: Diagnosis not present

## 2019-07-30 DIAGNOSIS — Z992 Dependence on renal dialysis: Secondary | ICD-10-CM | POA: Diagnosis not present

## 2019-07-30 DIAGNOSIS — N2581 Secondary hyperparathyroidism of renal origin: Secondary | ICD-10-CM | POA: Diagnosis not present

## 2019-08-01 DIAGNOSIS — N2581 Secondary hyperparathyroidism of renal origin: Secondary | ICD-10-CM | POA: Diagnosis not present

## 2019-08-01 DIAGNOSIS — N186 End stage renal disease: Secondary | ICD-10-CM | POA: Diagnosis not present

## 2019-08-01 DIAGNOSIS — D509 Iron deficiency anemia, unspecified: Secondary | ICD-10-CM | POA: Diagnosis not present

## 2019-08-01 DIAGNOSIS — Z992 Dependence on renal dialysis: Secondary | ICD-10-CM | POA: Diagnosis not present

## 2019-08-01 DIAGNOSIS — D631 Anemia in chronic kidney disease: Secondary | ICD-10-CM | POA: Diagnosis not present

## 2019-08-03 DIAGNOSIS — N186 End stage renal disease: Secondary | ICD-10-CM | POA: Diagnosis not present

## 2019-08-03 DIAGNOSIS — D631 Anemia in chronic kidney disease: Secondary | ICD-10-CM | POA: Diagnosis not present

## 2019-08-03 DIAGNOSIS — D509 Iron deficiency anemia, unspecified: Secondary | ICD-10-CM | POA: Diagnosis not present

## 2019-08-03 DIAGNOSIS — N2581 Secondary hyperparathyroidism of renal origin: Secondary | ICD-10-CM | POA: Diagnosis not present

## 2019-08-03 DIAGNOSIS — Z992 Dependence on renal dialysis: Secondary | ICD-10-CM | POA: Diagnosis not present

## 2019-08-05 DIAGNOSIS — N186 End stage renal disease: Secondary | ICD-10-CM | POA: Diagnosis not present

## 2019-08-05 DIAGNOSIS — Z992 Dependence on renal dialysis: Secondary | ICD-10-CM | POA: Diagnosis not present

## 2019-08-05 DIAGNOSIS — N2581 Secondary hyperparathyroidism of renal origin: Secondary | ICD-10-CM | POA: Diagnosis not present

## 2019-08-05 DIAGNOSIS — D509 Iron deficiency anemia, unspecified: Secondary | ICD-10-CM | POA: Diagnosis not present

## 2019-08-05 DIAGNOSIS — D631 Anemia in chronic kidney disease: Secondary | ICD-10-CM | POA: Diagnosis not present

## 2019-08-08 DIAGNOSIS — D509 Iron deficiency anemia, unspecified: Secondary | ICD-10-CM | POA: Diagnosis not present

## 2019-08-08 DIAGNOSIS — N2581 Secondary hyperparathyroidism of renal origin: Secondary | ICD-10-CM | POA: Diagnosis not present

## 2019-08-08 DIAGNOSIS — N186 End stage renal disease: Secondary | ICD-10-CM | POA: Diagnosis not present

## 2019-08-08 DIAGNOSIS — D631 Anemia in chronic kidney disease: Secondary | ICD-10-CM | POA: Diagnosis not present

## 2019-08-08 DIAGNOSIS — Z992 Dependence on renal dialysis: Secondary | ICD-10-CM | POA: Diagnosis not present

## 2019-08-10 DIAGNOSIS — N186 End stage renal disease: Secondary | ICD-10-CM | POA: Diagnosis not present

## 2019-08-10 DIAGNOSIS — Z992 Dependence on renal dialysis: Secondary | ICD-10-CM | POA: Diagnosis not present

## 2019-08-10 DIAGNOSIS — D631 Anemia in chronic kidney disease: Secondary | ICD-10-CM | POA: Diagnosis not present

## 2019-08-10 DIAGNOSIS — N2581 Secondary hyperparathyroidism of renal origin: Secondary | ICD-10-CM | POA: Diagnosis not present

## 2019-08-10 DIAGNOSIS — D509 Iron deficiency anemia, unspecified: Secondary | ICD-10-CM | POA: Diagnosis not present

## 2019-08-12 DIAGNOSIS — D509 Iron deficiency anemia, unspecified: Secondary | ICD-10-CM | POA: Diagnosis not present

## 2019-08-12 DIAGNOSIS — N186 End stage renal disease: Secondary | ICD-10-CM | POA: Diagnosis not present

## 2019-08-12 DIAGNOSIS — N2581 Secondary hyperparathyroidism of renal origin: Secondary | ICD-10-CM | POA: Diagnosis not present

## 2019-08-12 DIAGNOSIS — D631 Anemia in chronic kidney disease: Secondary | ICD-10-CM | POA: Diagnosis not present

## 2019-08-12 DIAGNOSIS — Z992 Dependence on renal dialysis: Secondary | ICD-10-CM | POA: Diagnosis not present

## 2019-08-15 DIAGNOSIS — D631 Anemia in chronic kidney disease: Secondary | ICD-10-CM | POA: Diagnosis not present

## 2019-08-15 DIAGNOSIS — Z992 Dependence on renal dialysis: Secondary | ICD-10-CM | POA: Diagnosis not present

## 2019-08-15 DIAGNOSIS — D509 Iron deficiency anemia, unspecified: Secondary | ICD-10-CM | POA: Diagnosis not present

## 2019-08-15 DIAGNOSIS — N2581 Secondary hyperparathyroidism of renal origin: Secondary | ICD-10-CM | POA: Diagnosis not present

## 2019-08-15 DIAGNOSIS — N186 End stage renal disease: Secondary | ICD-10-CM | POA: Diagnosis not present

## 2019-08-16 ENCOUNTER — Encounter: Payer: Self-pay | Admitting: Podiatry

## 2019-08-16 ENCOUNTER — Ambulatory Visit (INDEPENDENT_AMBULATORY_CARE_PROVIDER_SITE_OTHER): Payer: Medicare Other | Admitting: Podiatry

## 2019-08-16 ENCOUNTER — Other Ambulatory Visit: Payer: Self-pay

## 2019-08-16 DIAGNOSIS — L89619 Pressure ulcer of right heel, unspecified stage: Secondary | ICD-10-CM

## 2019-08-16 DIAGNOSIS — I96 Gangrene, not elsewhere classified: Secondary | ICD-10-CM | POA: Diagnosis not present

## 2019-08-16 DIAGNOSIS — E1151 Type 2 diabetes mellitus with diabetic peripheral angiopathy without gangrene: Secondary | ICD-10-CM | POA: Diagnosis not present

## 2019-08-16 DIAGNOSIS — B351 Tinea unguium: Secondary | ICD-10-CM

## 2019-08-16 MED ORDER — MUPIROCIN 2 % EX OINT: TOPICAL_OINTMENT | CUTANEOUS | 1 refills | Status: DC

## 2019-08-16 NOTE — Patient Instructions (Addendum)
CALL DR. FIELD'S OFFICE FOR FURTHER INSTRUCTIONS REGARDING LEFT LOWER EXTREMITY.  WEAR HEEL PROTECTOR TO RIGHT LOWER EXTREMITY AT ALL TIMES WHEN IN BED OR RECLINER.   APPLY MUPIROCIN OINTMENT TO RIGHT HEEL ONCE DAILY.      Pressure Injury  A pressure injury is damage to the skin and underlying tissue that results from pressure being applied to an area of the body. It often affects people who must spend a long time in a bed or chair because of a medical condition. Pressure injuries usually occur:  Over bony parts of the body, such as the tailbone, shoulders, elbows, hips, heels, spine, ankles, and back of the head.  Under medical devices that make contact with the body, such as respiratory equipment, stockings, tubes, and splints. Pressure injuries start as reddened areas on the skin and can lead to pain and an open wound. What are the causes? This condition is caused by frequent or constant pressure to an area of the body. Decreased blood flow to the skin can eventually cause the skin tissue to die and break down, causing a wound. What increases the risk? You are more likely to develop this condition if you:  Are in the hospital or an extended care facility.  Are bedridden or in a wheelchair.  Have an injury or disease that keeps you from: ? Moving normally. ? Feeling pain or pressure.  Have a condition that: ? Makes you sleepy or less alert. ? Causes poor blood flow.  Need to wear a medical device.  Have poor control of your bladder or bowel functions (incontinence).  Have poor nutrition (malnutrition). If you are at risk for pressure injuries, your health care provider may recommend certain types of mattresses, mattress covers, pillows, cushions, or boots to help prevent them. These may include products filled with air, foam, gel, or sand. What are the signs or symptoms? Symptoms of this condition depend on the severity of the injury. Symptoms may include:  Red or dark  areas of the skin.  Pain, warmth, or a change of skin texture.  Blisters.  An open wound. How is this diagnosed? This condition is diagnosed with a medical history and physical exam. You may also have tests, such as:  Blood tests.  Imaging tests.  Blood flow tests. Your pressure injury will be staged based on its severity. Staging is based on:  The depth of the tissue injury, including whether there is exposure of muscle, bone, or tendon.  The cause of the pressure injury. How is this treated? This condition may be treated by:  Relieving or redistributing pressure on your skin. This includes: ? Frequently changing your position. ? Avoiding positions that caused the wound or that can make the wound worse. ? Using specific bed mattresses, chair cushions, or protective boots. ? Moving medical devices from an area of pressure, or placing padding between the skin and the device. ? Using foams, creams, or powders to prevent rubbing (friction) on the skin.  Keeping your skin clean and dry. This may include using a skin cleanser or skin barrier as told by your health care provider.  Cleaning your injury and removing any dead tissue from the wound (debridement).  Placing a bandage (dressing) over your injury.  Using medicines for pain or to prevent or treat infection. Surgery may be needed if other treatments are not working or if your injury is very deep. Follow these instructions at home: Wound care  Follow instructions from your health care provider about  how to take care of your wound. Make sure you: ? Wash your hands with soap and water before and after you change your bandage (dressing). If soap and water are not available, use hand sanitizer. ? Change your dressing as told by your health care provider.  Check your wound every day for signs of infection. Have a caregiver do this for you if you are not able. Check for: ? Redness, swelling, or increased pain. ? More fluid  or blood. ? Warmth. ? Pus or a bad smell. Skin care  Keep your skin clean and dry. Gently pat your skin dry.  Do not rub or massage your skin.  You or a caregiver should check your skin every day for any changes in color or any new blisters or sores (ulcers). Medicines  Take over-the-counter and prescription medicines only as told by your health care provider.  If you were prescribed an antibiotic medicine, take or apply it as told by your health care provider. Do not stop using the antibiotic even if your condition improves. Reducing and redistributing pressure  Do not lie or sit in one position for a long time. Move or change position every 1-2 hours, or as told by your health care provider.  Use pillows or cushions to reduce pressure. Ask your health care provider to recommend cushions or pads for you. General instructions   Eat a healthy diet that includes lots of protein.  Drink enough fluid to keep your urine pale yellow.  Be as active as you can every day. Ask your health care provider to suggest safe exercises or activities.  Do not abuse drugs or alcohol.  Do not use any products that contain nicotine or tobacco, such as cigarettes, e-cigarettes, and chewing tobacco. If you need help quitting, ask your health care provider.  Keep all follow-up visits as told by your health care provider. This is important. Contact a health care provider if:  You have: ? A fever or chills. ? Pain that is not helped by medicine. ? Any changes in skin color. ? New blisters or sores. ? Pus or a bad smell coming from your wound. ? Redness, swelling, or pain around your wound. ? More fluid or blood coming from your wound.  Your wound does not improve after 1-2 weeks of treatment. Summary  A pressure injury is damage to the skin and underlying tissue that results from pressure being applied to an area of the body.  Do not lie or sit in one position for a long time. Your health care  provider may advise you to move or change position every 1-2 hours.  Follow instructions from your health care provider about how to take care of your wound.  Keep all follow-up visits as told by your health care provider. This is important. This information is not intended to replace advice given to you by your health care provider. Make sure you discuss any questions you have with your health care provider. Document Revised: 02/03/2018 Document Reviewed: 02/03/2018 Elsevier Patient Education  [employer].   Diabetes Mellitus and [place] care is an important part of your health, especially when you have diabetes. Diabetes may cause you to have problems because of poor blood flow (circulation) to your feet and legs, which can cause your skin to:  Become thinner and drier.  Break more easily.  Heal more slowly.  Peel and crack. You may also have nerve damage (neuropathy) in your legs and feet, causing decreased  feeling in them. This means that you may not notice minor injuries to your feet that could lead to more serious problems. Noticing and addressing any potential problems early is the best way to prevent future foot problems. How to care for your feet Foot hygiene  Wash your feet daily with warm water and mild soap. Do not use hot water. Then, pat your feet and the areas between your toes until they are completely dry. Do not soak your feet as this can dry your skin.  Trim your toenails straight across. Do not dig under them or around the cuticle. File the edges of your nails with an emery board or nail file.  Apply a moisturizing lotion or petroleum jelly to the skin on your feet and to dry, brittle toenails. Use lotion that does not contain alcohol and is unscented. Do not apply lotion between your toes. Shoes and socks  Wear clean socks or stockings every day. Make sure they are not too tight. Do not wear knee-high stockings since they may decrease blood flow to  your legs.  Wear shoes that fit properly and have enough cushioning. Always look in your shoes before you put them on to be sure there are no objects inside.  To break in new shoes, wear them for just a few hours a day. This prevents injuries on your feet. Wounds, scrapes, corns, and calluses  Check your feet daily for blisters, cuts, bruises, sores, and redness. If you cannot see the bottom of your feet, use a mirror or ask someone for help.  Do not cut corns or calluses or try to remove them with medicine.  If you find a minor scrape, cut, or break in the skin on your feet, keep it and the skin around it clean and dry. You may clean these areas with mild soap and water. Do not clean the area with peroxide, alcohol, or iodine.  If you have a wound, scrape, corn, or callus on your foot, look at it several times a day to make sure it is healing and not infected. Check for: ? Redness, swelling, or pain. ? Fluid or blood. ? Warmth. ? Pus or a bad smell. General instructions  Do not cross your legs. This may decrease blood flow to your feet.  Do not use heating pads or hot water bottles on your feet. They may burn your skin. If you have lost feeling in your feet or legs, you may not know this is happening until it is too late.  Protect your feet from hot and cold by wearing shoes, such as at the beach or on hot pavement.  Schedule a complete foot exam at least once a year (annually) or more often if you have foot problems. If you have foot problems, report any cuts, sores, or bruises to your health care provider immediately. Contact a health care provider if:  You have a medical condition that increases your risk of infection and you have any cuts, sores, or bruises on your feet.  You have an injury that is not healing.  You have redness on your legs or feet.  You feel burning or tingling in your legs or feet.  You have pain or cramps in your legs and feet.  Your legs or feet are  numb.  Your feet always feel cold.  You have pain around a toenail. Get help right away if:  You have a wound, scrape, corn, or callus on your foot and: ? You have  pain, swelling, or redness that gets worse. ? You have fluid or blood coming from the wound, scrape, corn, or callus. ? Your wound, scrape, corn, or callus feels warm to the touch. ? You have pus or a bad smell coming from the wound, scrape, corn, or callus. ? You have a fever. ? You have a red line going up your leg. Summary  Check your feet every day for cuts, sores, red spots, swelling, and blisters.  Moisturize feet and legs daily.  Wear shoes that fit properly and have enough cushioning.  If you have foot problems, report any cuts, sores, or bruises to your health care provider immediately.  Schedule a complete foot exam at least once a year (annually) or more often if you have foot problems. This information is not intended to replace advice given to you by your health care provider. Make sure you discuss any questions you have with your health care provider. Document Revised: 03/30/2019 Document Reviewed: 08/08/2016 Elsevier Patient Education  [place].

## 2019-08-17 DIAGNOSIS — N2581 Secondary hyperparathyroidism of renal origin: Secondary | ICD-10-CM | POA: Diagnosis not present

## 2019-08-17 DIAGNOSIS — D631 Anemia in chronic kidney disease: Secondary | ICD-10-CM | POA: Diagnosis not present

## 2019-08-17 DIAGNOSIS — Z992 Dependence on renal dialysis: Secondary | ICD-10-CM | POA: Diagnosis not present

## 2019-08-17 DIAGNOSIS — D509 Iron deficiency anemia, unspecified: Secondary | ICD-10-CM | POA: Diagnosis not present

## 2019-08-17 DIAGNOSIS — N186 End stage renal disease: Secondary | ICD-10-CM | POA: Diagnosis not present

## 2019-08-17 NOTE — Progress Notes (Signed)
Subjective:   [name] is a pleasant 69 y.o. AAF, well known to our practice, who presents today for at-risk foot care. Her sister is present during the visit. She has h/o diabetes with PAD, and ESRD requiring dialysis. Patient presents with cc of painful left foot with gangrene. She underwent abdominal aortogram of lower extremity on 07/29/2019, by Dr. Ruta Hinds, which revealed no option for revascularization. She was given options of continued wound care, digital amputation or below knee amputation of left lower extremity.  She and her sister would like to know if I am in agreement with recommended surgical procedure.    She relates pain to left lower extremity. She denies any fever, chills, nightsweats, nausea or vomiting.  [name] also relates pain to posterior right heel. They deny any episodes of trauma to limb.   Current Outpatient Medications on File Prior to Visit  Medication Sig Dispense Refill  . Methoxy PEG-Epoetin Beta (MIRCERA IJ) Mircera    . sennosides (SENOKOT) 8.8 MG/5ML syrup Take by mouth.    Marland Kitchen acetaminophen (TYLENOL) 500 MG tablet Take 500-1,000 mg by mouth every 6 (six) hours as needed for moderate pain.     Marland Kitchen albuterol (PROVENTIL) (2.5 MG/3ML) 0.083% nebulizer solution Take 3 mLs (2.5 mg total) by nebulization every 6 (six) hours as needed for wheezing or shortness of breath. 75 mL 12  . Alcohol Swabs (B-D SINGLE USE SWABS REGULAR) PADS USE UTD FOR INSULIN INJECTIONS TID    . amLODipine (NORVASC) 5 MG tablet Take 5 mg by mouth daily.     Marland Kitchen aspirin 81 MG chewable tablet Chew 81 mg by mouth daily.     Marland Kitchen aspirin EC 81 MG tablet Take 81 mg by mouth daily.    Marland Kitchen atropine 1 % ophthalmic solution Place 1 drop into the right eye 2 (two) times daily.  0  . B Complex-C-Folic Acid (RENA-VITE RX) 1 MG TABS Take 1 tablet by mouth daily with lunch.   1  . BD PEN NEEDLE NANO 2ND GEN 32G X 4 MM MISC USE WITH INSULIN PEN 3 TIMES A DAY    . carvedilol (COREG) 25 MG tablet  Take 25 mg by mouth 2 (two) times daily.  2  . clopidogrel (PLAVIX) 75 MG tablet Take 1 tablet (75 mg total) by mouth daily. (Patient taking differently: Take 75 mg by mouth at bedtime. ) 30 tablet 11  . darbepoetin (ARANESP) 200 MCG/0.4ML SOLN injection Inject 0.4 mLs (200 mcg total) into the vein every Wednesday with hemodialysis. 1.68 mL   . diclofenac sodium (VOLTAREN) 1 % GEL APP EXT AA UP TO QID    . docusate sodium (COLACE) 100 MG capsule Take 100 mg by mouth 2 (two) times daily.     Marland Kitchen doxercalciferol (HECTOROL) 4 MCG/2ML injection Inject 0.5 mLs (1 mcg total) into the vein every Monday, Wednesday, and Friday with hemodialysis. 2 mL   . DULoxetine (CYMBALTA) 30 MG capsule Take 30 mg by mouth daily.     . fluticasone (FLONASE) 50 MCG/ACT nasal spray Place 1-2 sprays into both nostrils daily as needed for allergies.     Marland Kitchen gabapentin (NEURONTIN) 100 MG capsule Take 100 mg by mouth 2 (two) times daily.    . insulin aspart (NOVOLOG FLEXPEN) 100 UNIT/ML FlexPen Inject 6-10 Units into the skin 3 (three) times daily with meals. per Sliding scale    . lanthanum (FOSRENOL) 1000 MG chewable tablet Chew 1,000-3,000 mg by mouth See admin instructions. Take  3 tablets (3000 mg) by mouth with each meal & take 1 tablet (1000 mg) by mouth with each snack    . lisinopril (PRINIVIL,ZESTRIL) 40 MG tablet Take 40 mg by mouth at bedtime.     Marland Kitchen omeprazole (PRILOSEC) 40 MG capsule Take 1 capsule (40 mg total) by mouth daily. (Patient taking differently: Take 40 mg by mouth 2 (two) times daily. )    . ONETOUCH VERIO test strip 3 (three) times daily. for testing  9  . prednisoLONE acetate (PRED FORTE) 1 % ophthalmic suspension Place 1 drop into the right eye daily.   0   Current Facility-Administered Medications on File Prior to Visit  Medication Dose Route Frequency Provider Last Rate Last Admin  . 0.9 %  sodium chloride infusion  500 mL Intravenous Once [name], MD      . dexamethasone (DECADRON) injection  4 mg  4 mg Intramuscular Once Hilts, Legrand Como, MD         Allergies  Allergen Reactions  . Latex Hives  . Oxycodone Other (See Comments)    Hallucinations   . Tramadol Other (See Comments)    hallucinations   . Tape Rash    53M Transpore adhesive tape. Medical tape  . Vicodin [Hydrocodone-Acetaminophen] Other (See Comments)    hallucinations     Objective:   69 y.o. AAF, obese, in NAD. AAO x 3. In pain.  Vascular Examination: Capillary refill time delayed to digits right foot; gangrenous digits noted left 2nd and 4th toes. Open wound medial aspect left hallux, nonhealing.  Diminished pedal pulses b/l.  Digital hair absent b/l.  Skin temperature gradient warm to cool.  Dermatological: Toenails 1-5 right foot elongated, mycotic, with subungual debris.  Ischemic ulceration noted medial aspect left hallux. No erythema, no lymphangitis noted.  No exposed bone, no undermining, no active pus noted.  No odor.   Arterial ulceration developing plantarmedial 1st metatarsal head right foot.  Dry gangrenous digits noted left 2nd, 3rd and 4th digits which appear to be stable.   Superficial heel ulceration posterior right heel, unstageable. No erythema, no edema, no drainage, no flocculence, no odor. Measures 1.0 x 1.0 cm with tenderness to palpation.  Musculoskeletal: Muscle strength testing deferred.  Hallux valgus b/l.   VAS [place] ABI WITH/WO TBI:  Result Date: 07/21/2019 LOWER EXTREMITY DOPPLER STUDY Indications: Claudication, and peripheral artery disease. High Risk Factors: Hypertension, Diabetes, no history of smoking.  Vascular Interventions: 11/30/2018 Left ATA angioplasty. Performing Technologist: Delorise Shiner RVT  Examination Guidelines: A complete evaluation includes at minimum, Doppler waveform signals and systolic blood pressure reading at the level of bilateral brachial, anterior tibial, and posterior tibial arteries, when vessel segments are accessible. Bilateral testing  is considered an integral part of a complete examination. Photoelectric Plethysmograph (PPG) waveforms and toe systolic pressure readings are included as required and additional duplex testing as needed. Limited examinations for reoccurring indications may be performed as noted.    ABI Findings: +---------+------------------+-----+----------+---------------+ Right    Rt Pressure (mmHg)IndexWaveform  Comment         +---------+------------------+-----+----------+---------------+ Brachial                                  Dialysis access +---------+------------------+-----+----------+---------------+ ATA      44                0.17                           +---------+------------------+-----+----------+---------------+  PTA      255               1.00 monophasic                +---------+------------------+-----+----------+---------------+ DP                              monophasic                +---------+------------------+-----+----------+---------------+ Great Toe84                0.33                           +---------+------------------+-----+----------+---------------+ +---------+------------------+-----+----------+----------------+ Left     Lt Pressure (mmHg)IndexWaveform  Comment          +---------+------------------+-----+----------+----------------+ Brachial 255                              Non compressible +---------+------------------+-----+----------+----------------+ ATA      57                0.22                            +---------+------------------+-----+----------+----------------+ PTA      83                0.33 monophasic                 +---------+------------------+-----+----------+----------------+ PERO     65                0.25 monophasic                 +---------+------------------+-----+----------+----------------+ DP                              monophasic                  +---------+------------------+-----+----------+----------------+ Great Toe18                0.07                            +---------+------------------+-----+----------+----------------+ +-------+----------------+-----------+----------------+------------+ ABI/TBIToday's ABI     Today's TBIPrevious ABI    Previous TBI +-------+----------------+-----------+----------------+------------+ Right  Non compressible           Non compressible0.49         +-------+----------------+-----------+----------------+------------+ Left                              0.65            0.38         +-------+----------------+-----------+----------------+------------+ Arterial wall calcification precludes accurate ankle pressures and ABIs.  Summary: Right: Resting right ankle-brachial index indicates noncompressible right lower extremity arteries. The right toe-brachial index is abnormal. ABIs are unreliable. TBIs are unreliable. Left: The left toe-brachial index is abnormal. ABIs are unreliable. TBIs are unreliable. ABIs could not be calculated secondary to non compressible brachial artery.  *See table(s) above for measurements and observations.  Electronically signed by Ruta Hinds MD on 07/21/2019 at 2:36:34 PM.    Final    VAS [place] LOWER EXTREMITY ARTERIAL DUPLEX  Result Date:  07/21/2019 LOWER EXTREMITY ARTERIAL DUPLEX STUDY Indications: Claudication, and peripheral artery disease.  High Risk Factors: Hypertension, Diabetes, no history of smoking. Other Factors: Bilateral lower extremity pain.   Vascular Interventions: 11/30/2018 Left ATA angioplasty.  Current ABI:  ABIs could not be calculated secondary to non                         compressible brachial artery.  Comparison       Prior study 01/06/2019 showed an occluded posterior tibial  Study:  artery. Performing Technologist: Delorise Shiner RVT  Examination Guidelines:   A complete evaluation includes B-mode imaging, spectral Doppler, color  Doppler, and power Doppler as needed of all accessible portions of each vessel. Bilateral testing is considered an integral part of a complete examination. Limited examinations for reoccurring indications may be performed as noted.  +----------+--------+-----+---------------+----------+--------+ RIGHT     PSV cm/sRatioStenosis       Waveform  Comments +----------+--------+-----+---------------+----------+--------+ CFA Distal47                          biphasic           +----------+--------+-----+---------------+----------+--------+ DFA       68                          biphasic           +----------+--------+-----+---------------+----------+--------+ SFA Prox  66                          biphasic           +----------+--------+-----+---------------+----------+--------+ SFA Mid   63                          biphasic           +----------+--------+-----+---------------+----------+--------+ SFA Distal230          50-74% stenosisbiphasic           +----------+--------+-----+---------------+----------+--------+ POP Prox  31                          monophasic         +----------+--------+-----+---------------+----------+--------+ POP Mid   57                          monophasic         +----------+--------+-----+---------------+----------+--------+ POP Distal32                          monophasic         +----------+--------+-----+---------------+----------+--------+ ATA Mid   68                          monophasic         +----------+--------+-----+---------------+----------+--------+ ATA Distal142                         monophasic         +----------+--------+-----+---------------+----------+--------+ PTA Distal0            occluded       absent             +----------+--------+-----+---------------+----------+--------+ A focal velocity elevation of 230 cm/s was obtained at Distal right SFA with a  VR of 4.7. Findings are  characteristic of 50-74% stenosis. A 2nd focal velocity elevation was visualized, measuring 0 cm/s at Posterior tibial artery. Findings are characteristic of occluded.  +-----------+--------+-----+--------+----------+--------+ LEFT       PSV cm/sRatioStenosisWaveform  Comments +-----------+--------+-----+--------+----------+--------+ CFA Prox   86                   biphasic           +-----------+--------+-----+--------+----------+--------+ CFA Distal 81                   biphasic           +-----------+--------+-----+--------+----------+--------+ DFA        45                   biphasic           +-----------+--------+-----+--------+----------+--------+ SFA Prox   83                   biphasic           +-----------+--------+-----+--------+----------+--------+ SFA Mid    70                   biphasic           +-----------+--------+-----+--------+----------+--------+ SFA Distal 73                   biphasic           +-----------+--------+-----+--------+----------+--------+ POP Prox   53                   biphasic           +-----------+--------+-----+--------+----------+--------+ POP Distal 62                   biphasic           +-----------+--------+-----+--------+----------+--------+ ATA Prox   24                   biphasic           +-----------+--------+-----+--------+----------+--------+ ATA Distal 38                   monophasic         +-----------+--------+-----+--------+----------+--------+ PTA Distal 0            occludedabsent             +-----------+--------+-----+--------+----------+--------+ PERO Distal33                   monophasic         +-----------+--------+-----+--------+----------+--------+   Waveform analysis on anterior tibial artery decreasing from biphasic proximally to monophasic distally suggests an obstruction. No flow in the posterior tibial artery consistent with occlusion.     Summary:  Right: 50-74% stenosis noted in the superficial femoral artery. Total occlusion noted in the posterior tibial artery.   Left: Total occlusion noted in the posterior tibial artery.    See table(s) above for measurements and observations. Electronically signed by Ruta Hinds MD on 07/21/2019 at 2:56:41 PM.    Final    Assessment:   1. Dry gangrene left foot 2. Decubitus ulceration posterior right heel 3.   NIDDM with PAD 4.   ESRD on hemodialysis  Plan: 1. Vascular input appreciated. Discussed findings with patient and her sister. Informed patient her pain is ischemic pain due to lack of circulation to extremity. After reviewing aortogram findings, I am in total agreement with Dr.  Fields for below knee amputation of left lower extremity. Informed patient there was no option for revascularization per report. Painted picture of digital amputation most likely not healing, thus requiring a subsequent amputation.  Recommended patient discuss with her family tonight and call Dr. Nona Dell office tomorrow morning. 2. For right heel decubitus ulceration, prescription sent to pharmacy for Mupirocin Ointment to be applied once daily.  3. Dispensed heel protector for right foot to be worn when in bed and sitting in recliner to alleviate pressure to posterior heel. 4. Toenails 1-5 right foot only debrided without incident.  5. Patient instructed to report to emergency department with worsening appearance of left limb, increased pain, foul odor, increased redness, swelling, drainage, fever, chills, nightsweats, nausea, vomiting, increased or low blood sugar.  6. Follow up 9 weeks.   7. Patient/sister related understanding.

## 2019-08-19 ENCOUNTER — Telehealth: Payer: Self-pay | Admitting: *Deleted

## 2019-08-19 ENCOUNTER — Encounter: Payer: Self-pay | Admitting: *Deleted

## 2019-08-19 ENCOUNTER — Other Ambulatory Visit: Payer: Self-pay | Admitting: *Deleted

## 2019-08-19 DIAGNOSIS — N2581 Secondary hyperparathyroidism of renal origin: Secondary | ICD-10-CM | POA: Diagnosis not present

## 2019-08-19 DIAGNOSIS — D509 Iron deficiency anemia, unspecified: Secondary | ICD-10-CM | POA: Diagnosis not present

## 2019-08-19 DIAGNOSIS — D631 Anemia in chronic kidney disease: Secondary | ICD-10-CM | POA: Diagnosis not present

## 2019-08-19 DIAGNOSIS — Z992 Dependence on renal dialysis: Secondary | ICD-10-CM | POA: Diagnosis not present

## 2019-08-19 DIAGNOSIS — N186 End stage renal disease: Secondary | ICD-10-CM | POA: Diagnosis not present

## 2019-08-19 NOTE — Telephone Encounter (Signed)
I spoke with patient and her sister. [name] stated after speaking with Dr. Oneida Alar she is agreeable to Left below the knee amputation. Reviewed instructions as per letter(see Letter). Sister desires to come by the office and pick up copy. Verbalized understanding. To call this office if questions.

## 2019-08-22 DIAGNOSIS — E1129 Type 2 diabetes mellitus with other diabetic kidney complication: Secondary | ICD-10-CM | POA: Diagnosis not present

## 2019-08-22 DIAGNOSIS — N186 End stage renal disease: Secondary | ICD-10-CM | POA: Diagnosis not present

## 2019-08-22 DIAGNOSIS — Z992 Dependence on renal dialysis: Secondary | ICD-10-CM | POA: Diagnosis not present

## 2019-08-22 DIAGNOSIS — D509 Iron deficiency anemia, unspecified: Secondary | ICD-10-CM | POA: Diagnosis not present

## 2019-08-22 DIAGNOSIS — N2581 Secondary hyperparathyroidism of renal origin: Secondary | ICD-10-CM | POA: Diagnosis not present

## 2019-08-22 DIAGNOSIS — D631 Anemia in chronic kidney disease: Secondary | ICD-10-CM | POA: Diagnosis not present

## 2019-08-25 DIAGNOSIS — D509 Iron deficiency anemia, unspecified: Secondary | ICD-10-CM | POA: Diagnosis not present

## 2019-08-25 DIAGNOSIS — D631 Anemia in chronic kidney disease: Secondary | ICD-10-CM | POA: Diagnosis not present

## 2019-08-25 DIAGNOSIS — N2581 Secondary hyperparathyroidism of renal origin: Secondary | ICD-10-CM | POA: Diagnosis not present

## 2019-08-25 DIAGNOSIS — E1129 Type 2 diabetes mellitus with other diabetic kidney complication: Secondary | ICD-10-CM | POA: Diagnosis not present

## 2019-08-25 DIAGNOSIS — N186 End stage renal disease: Secondary | ICD-10-CM | POA: Diagnosis not present

## 2019-08-25 DIAGNOSIS — Z992 Dependence on renal dialysis: Secondary | ICD-10-CM | POA: Diagnosis not present

## 2019-08-26 ENCOUNTER — Other Ambulatory Visit (HOSPITAL_COMMUNITY)
Admission: RE | Admit: 2019-08-26 | Discharge: 2019-08-26 | Disposition: A | Discharge status: Home / Self Care | Payer: Medicare Other | Source: Ambulatory Visit | Attending: Vascular Surgery | Admitting: Vascular Surgery

## 2019-08-26 ENCOUNTER — Other Ambulatory Visit: Payer: Self-pay

## 2019-08-26 ENCOUNTER — Encounter (HOSPITAL_COMMUNITY): Payer: Self-pay | Admitting: Vascular Surgery

## 2019-08-26 DIAGNOSIS — Z01812 Encounter for preprocedural laboratory examination: Secondary | ICD-10-CM | POA: Insufficient documentation

## 2019-08-26 DIAGNOSIS — Z20822 Contact with and (suspected) exposure to covid-19: Secondary | ICD-10-CM | POA: Diagnosis not present

## 2019-08-26 DIAGNOSIS — N186 End stage renal disease: Secondary | ICD-10-CM | POA: Diagnosis not present

## 2019-08-26 DIAGNOSIS — D509 Iron deficiency anemia, unspecified: Secondary | ICD-10-CM | POA: Diagnosis not present

## 2019-08-26 DIAGNOSIS — N2581 Secondary hyperparathyroidism of renal origin: Secondary | ICD-10-CM | POA: Diagnosis not present

## 2019-08-26 DIAGNOSIS — Z992 Dependence on renal dialysis: Secondary | ICD-10-CM | POA: Diagnosis not present

## 2019-08-26 DIAGNOSIS — D631 Anemia in chronic kidney disease: Secondary | ICD-10-CM | POA: Diagnosis not present

## 2019-08-26 DIAGNOSIS — E1129 Type 2 diabetes mellitus with other diabetic kidney complication: Secondary | ICD-10-CM | POA: Diagnosis not present

## 2019-08-26 LAB — SARS CORONAVIRUS 2 (TAT 6-24 HRS): SARS Coronavirus 2: NEGATIVE

## 2019-08-26 NOTE — Progress Notes (Signed)
SDW-pre-op call completed by pt sister, [name] [facility]). [name] denies that pt C/O SOB and chest pain.[name] was unable to recall name of pt cardiologist. [name] stated that pt PCP is Dr. Shanda Bumps. [name] stated that pt last dose of Plavix was " Wednesday " as instructed. [name] made aware to have pt stop taking vitamins, fish oil and herbal medications. Do not take any NSAIDs ie: Ibuprofen, Advil, Naproxen (Aleve), Motrin, Diclofenac (Voltaren), BC and Goody Powder. [name] denies recent labs. [name] made aware to check pt CBG every 2 hours prior to arrival to hospital on DOS. [name] made aware to treat a CBG < 70 with4 ounces of apple juice, wait 15 minutes after intervention to recheck BG, if BG remains < 70, call Short Stay unit to speak with a nurse. [name] made aware to not administer Novolog insulin on DOS unless CBG is > 220. If CBG > 220 take 1/2 correction dose of sliding scale of Novolog insulin. [name] reminded to have pt quarantine. [name] verbalized understanding of all pre-op instructions.

## 2019-08-29 ENCOUNTER — Encounter (HOSPITAL_COMMUNITY): Payer: Self-pay | Admitting: Vascular Surgery

## 2019-08-29 DIAGNOSIS — Z992 Dependence on renal dialysis: Secondary | ICD-10-CM | POA: Diagnosis not present

## 2019-08-29 DIAGNOSIS — N2581 Secondary hyperparathyroidism of renal origin: Secondary | ICD-10-CM | POA: Diagnosis not present

## 2019-08-29 DIAGNOSIS — E1129 Type 2 diabetes mellitus with other diabetic kidney complication: Secondary | ICD-10-CM | POA: Diagnosis not present

## 2019-08-29 DIAGNOSIS — D631 Anemia in chronic kidney disease: Secondary | ICD-10-CM | POA: Diagnosis not present

## 2019-08-29 DIAGNOSIS — N186 End stage renal disease: Secondary | ICD-10-CM | POA: Diagnosis not present

## 2019-08-29 DIAGNOSIS — D509 Iron deficiency anemia, unspecified: Secondary | ICD-10-CM | POA: Diagnosis not present

## 2019-08-29 NOTE — Anesthesia Preprocedure Evaluation (Addendum)
Anesthesia Evaluation  Patient identified by MRN, date of birth, ID band Patient awake    Reviewed: Allergy & Precautions, NPO status , Patient's Chart, lab work & pertinent test results  Airway Mallampati: I  TM Distance: >3 FB Neck ROM: Full    Dental   Pulmonary shortness of breath,    Pulmonary exam normal        Cardiovascular hypertension, Pt. on medications +CHF  Normal cardiovascular exam  Echo 10/30/16: Study Conclusions  - Left ventricle: The cavity size was normal. There was moderate  concentric hypertrophy. Systolic function was mildly reduced. The  estimated ejection fraction was in the range of 45% to 50%.  Diffuse hypokinesis. Features are consistent with a pseudonormal  left ventricular filling pattern, with concomitant abnormal  relaxation and increased filling pressure (grade 2 diastolic  dysfunction).  - Aortic valve: Valve mobility was restricted. There was mild  stenosis. Peak velocity (S): 238 cm/s. Mean gradient (S): 13 mm  Hg.  - Mitral valve: Severely calcified annulus. There was moderate  regurgitation directed posteriorly.  - Left atrium: The atrium was moderately dilated. Volume/bsa, ES,  (1-plane Simpson&'s, A2C): 41.5 ml/m^2.  - Right ventricle: The cavity size was moderately dilated. Wall  thickness was normal.  - Right atrium: The atrium was moderately dilated.  - Tricuspid valve: There was moderate regurgitation.  - Pulmonary arteries: Systolic pressure was moderately increased.  PA peak pressure: 64 mm Hg (S).   Cardiac cath 04/02/15 [facility]): Conclusions: Cath showed no angiographic evidence of CAD. EF is 40% with  global hypokinesis.    Neuro/Psych Anxiety    GI/Hepatic GERD  Controlled,  Endo/Other  diabetes, Type 2  Renal/GU ESRF and DialysisRenal disease     Musculoskeletal   Abdominal   Peds  Hematology   Anesthesia Other Findings    Reproductive/Obstetrics                            Anesthesia Physical Anesthesia Plan  ASA: III  Anesthesia Plan: General   Post-op Pain Management:    Induction: Intravenous  PONV Risk Score and Plan: 3 and Ondansetron, Midazolam and Treatment may vary due to age or medical condition  Airway Management Planned: LMA  Additional Equipment:   Intra-op Plan:   Post-operative Plan: Extubation in OR  Informed Consent: I have reviewed the patients History and Physical, chart, labs and discussed the procedure including the risks, benefits and alternatives for the proposed anesthesia with the patient or authorized representative who has indicated his/her understanding and acceptance.       Plan Discussed with: CRNA and Surgeon  Anesthesia Plan Comments: (See PAT note written 08/29/2019 by Myra Gianotti, PA-C. )       Anesthesia Quick Evaluation

## 2019-08-29 NOTE — Progress Notes (Signed)
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  History includes never smoker, PAD (left ATA angioplasty 11/30/18), HTN, GERD, anemia, DM2, neuropathy, myalgia, left breast cancer (s/p left partial mastectomy, LN biopsy 07/26/10, s/p chemoradiation), ESRD, murmur (mild AS, moderate MR, moderate TR 10/30/16 echo), chronic combined CHF, dyspnea (if missed dialysis), glaucoma (with "blindness"; left eye enucleation 2015).  Mildly decreased LVEF since at least 2016. Normal coronaries at that time. Stable LVEF 45-50% in 10/2016 and had moderate MR/TR and mild AS at that time. She undergoes hemodialysis 3x/week. Evaluation by PCP last month. Denied chest pain and SOB. She has a non-healing left foot wound without revascularization options. She has now agreed to left BKA. Last Plavix 08/24/19.    08/26/2019 presurgical COVID-19 test was negative. She is a same day work-up, so anesthesia team to evaluate on the day of surgery.   VS: As of 07/29/19, BP 128/44, HR 71.     PROVIDERS: Prince Solian, MD is PCP Research officer, political party). 07/28/19 note scanned under Media tab. - Patient could not recall name of previous cardiologist. In review of [place] records, she was last seen in 2015 by Mertie Moores, MD for pre-operative evaluation for eye surgery. PRN follow-up recommended. Had previously seen Daneen Schick, MD. She later had a cardiac cath in 2016 following an abnormal stress echo at [facility] as part of pre-transplant work-up. Cath showed normal coronaries, EF 40%. Last echo in 2018 ordered by Hospitalist (see below).  - Hemodialysis as  [facility], MWF. She was not felt to be a candidate for renal transplant as of 06/2015 due to multiple co-morbidities [facility]). Annalee Genta, MD is GI [name], MD is retinal specialist   LABS: She is for updated labs on arrival. AS of 07/29/19, glucose 125, H/H 11.6/34.0 by ISTAT.   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  She was offered today continued local wound care versus a toe amputation which I suspect will not heal.  Other option would be a left below-knee amputation.  This was discussed with the patient and her family.  They will call us back in the future if she wished to do a left below-knee amputation.   Echo 10/30/16 (during hospitalization for acute respiratory failure/hypoxia): Study Conclusions  - Left ventricle: The cavity size was normal. There was moderate concentric hypertrophy. Systolic function was mildly reduced. The estimated ejection fraction was in the range of 45% to 50%. Diffuse hypokinesis. Features are consistent with a pseudonormal left ventricular filling pattern, with concomitant abnormal relaxation and increased filling pressure (grade 2 diastolic dysfunction).  - Aortic valve: Valve mobility was restricted. There was mild stenosis. VTI ratio of LVOT to aortic valve: 0.43. Valve area (VTI): 0.76 cm^2. Indexed valve area (VTI): 0.37 cm^2/m^2. Peak velocity ratio  of LVOT to aortic valve: 0.43. Valve area (Vmax): 0.76 cm^2. Indexed valve area (Vmax): 0.37 cm^2/m^2. Mean velocity ratio of LVOT to aortic valve: 0.42. Valve area (Vmean): 0.75 cm^2. Indexed valve area  (Vmean): 0.36 cm^2/m^2. Peak velocity (S): 238 cm/s. Mean gradient (S): 13 mm Hg. Peak gradient (S): 23 mm Hg.  - Mitral valve: Severely calcified annulus. There was moderate regurgitation directed posteriorly.  - Left atrium: The atrium was moderately dilated. Volume/bsa, ES, (1-plane Simpson&'s, A2C): 41.5 ml/m^2.  - Right ventricle: The cavity size was moderately dilated. Wall thickness was normal.  - Right atrium: The atrium was moderately dilated.  - Tricuspid valve: There was moderate regurgitation.  - Pulmonary arteries: Systolic pressure was moderately increased.PA peak pressure: 64 mm Hg (S).    Cardiac cath 04/02/15 [facility]): Conclusions: Cath showed no angiographic evidence of CAD. EF is 40% with  global hypokinesis.   Stress echo 11/28/14 [facility]): SUMMARY The patient had no chest pain during stress The patient achieved 86 % of maximum predicted heart rate. Positive stress ECG for inducible ischemia at target heart rate. There is mild global hypokinesis of the left ventricular wall The estimated LV ejection fraction is 45% . There was a decrease in left ventricular function with dobutamine. There was left ventricular dilatation with dobutamine. Multiple segments involviing basal and mid anteroseptal, septal, anterior and  anterolateral segments worsened with dobutamine stress to become more  severely hypokinetic or akinetic. Positive dobutamine stress echocardiogram for inducible ischemia at target  heart rate. Of note, patient had severe hypertension during dobutamine stress, an d this  can impact wall motion. Clinical correlation suggested. - Referred for cardiac cath   Past Medical History:  Diagnosis Date  . Anemia   . Anxiety   . Arthritis    "joints" (06/15/2013)  . Asthma   . 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 Blood transfusion without reported diagnosis   . Breast cancer ([place])    left  . CHF (congestive heart failure) ([place])   .  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 Esophageal ulcer with bleeding   . ESRD (end stage renal disease) ([place])    "suppose to start dialysis today" (06/15/2013)  . Family history of adverse reaction to anesthesia    It took a while for pt sister to wake from anesthesia  . GERD (gastroesophageal reflux disease)   . Glaucoma    blind in both eyes  . Heart murmur   . Hx of adenomatous colonic polyps 04/07/2018  . Hypertension   . Myalgia 12/31/2011  . Neuropathy 12/31/2011  . PAD (peripheral artery disease) (H[facility])    nonviable tissue left lower extremity  . Shortness of breath    "when she doesn't go to dialysis"  . Type II diabetes mellitus ([place])     Past Surgical History:  Procedure Laterality Date  . A/V FISTULAGRAM N/A 09/21/2018   Procedure: A/V FISTULAGRAM - Right Upper;  Surgeon: [name], MD;  Location: [place] CV LAB;  Service: Cardiovascular;  Laterality: N/A;  . ABDOMINAL AORTOGRAM N/A 11/30/2018   Procedure: ABDOMINAL AORTOGRAM;  Surgeon: [name], MD;  Location: [place] CV LAB;  Service: Cardiovascular;  Laterality: N/A;  . ABDOMINAL AORTOGRAM W/LOWER EXTREMITY Left 07/29/2019   Procedure: ABDOMINAL AORTOGRAM W/LOWER EXTREMITY;  Surgeon: [name], MD;  Location: [place] CV LAB;  Service: Cardiovascular;  Laterality: Left;  . ABDOMINAL HYSTERECTOMY  partial  . AV FISTULA PLACEMENT Right 06/06/2013   Procedure: ARTERIOVENOUS (AV) FISTULA CREATION-RIGHT BRACHIAL CEPHALIC;  Surgeon: Conrad [place], MD;  Location: [place];  Service: Vascular;  Laterality: Right;  . BREAST BIOPSY Left   . BREAST LUMPECTOMY Left    "and took out some lymph nodes" (06/15/2013)  . CARDIAC CATHETERIZATION     04/02/15 [facility]): no angiographic CAD, LVEF 40% with global hypokinesis (LHC done for + stress echo, EF 45% 11/28/14)  . CARPAL TUNNEL RELEASE Left 11/26/2017   Procedure: LEFT CARPAL TUNNEL RELEASE;  Surgeon: [name], MD;   Location: [place];  Service: Orthopedics;  Laterality: Left;  . CATARACT EXTRACTION W/ ANTERIOR VITRECTOMY Bilateral   . CESAREAN SECTION  1980  . COLONOSCOPY    . ESOPHAGOGASTRODUODENOSCOPY N/A 07/08/2017   Procedure: ESOPHAGOGASTRODUODENOSCOPY (EGD);  Surgeon: [name], MD;  Location: [facility] ENDOSCOPY;  Service: Endoscopy;  Laterality: N/A;  . ESOPHAGOGASTRODUODENOSCOPY (EGD) WITH PROPOFOL N/A 02/07/2018   Procedure: ESOPHAGOGASTRODUODENOSCOPY (EGD) WITH PROPOFOL;  Surgeon: [name], MD;  Location: [facility] ENDOSCOPY;  Service: Gastroenterology;  Laterality: N/A;  APC and clips placed  . EYE SURGERY Bilateral    laser surgery  . LOWER EXTREMITY ANGIOGRAPHY Bilateral 11/30/2018   Procedure: LOWER EXTREMITY ANGIOGRAPHY;  Surgeon: [name], MD;  Location: [place] CV LAB;  Service: Cardiovascular;  Laterality: Bilateral;  . PARS PLANA VITRECTOMY Right 05/05/2017   Procedure: PARS PLANA VITRECTOMY WITH 25 GAUGE; PARTIAL REMOVAL OF OIL; INFERIOR PERIPHERAL IRIDECTOMY, REFORM ANTERIOR CHAMBER RIGHT EYE;  Surgeon: [name], MD;  Location: [place];  Service: Ophthalmology;  Laterality: Right;  . PERIPHERAL VASCULAR BALLOON ANGIOPLASTY Right 09/21/2018   Procedure: PERIPHERAL VASCULAR BALLOON ANGIOPLASTY;  Surgeon: [name], MD;  Location: [place] CV LAB;  Service: Cardiovascular;  Laterality: Right;  AV fistula  . PERIPHERAL VASCULAR BALLOON ANGIOPLASTY Left 11/30/2018   Procedure: PERIPHERAL VASCULAR BALLOON ANGIOPLASTY;  Surgeon: [name], MD;  Location: [place] CV LAB;  Service: Cardiovascular;  Laterality: Left;  Left Anterior Tibial Artery  . REFRACTIVE SURGERY Bilateral   . REMOVAL OF A DIALYSIS CATHETER Right 06/06/2013   Procedure: REMOVAL OF RIGHT MEDIPORT;  Surgeon: Conrad [place], MD;  Location: [place];  Service: Vascular;  Laterality: Right;  . TONSILLECTOMY    . UPPER GASTROINTESTINAL ENDOSCOPY      MEDICATIONS: . 0.9 %  sodium chloride infusion  .  dexamethasone (DECADRON) injection 4 mg   . acetaminophen (TYLENOL) 500 MG tablet  . albuterol (PROVENTIL) (2.5 MG/3ML) 0.083% nebulizer solution  . amLODipine (NORVASC) 5 MG tablet  . atropine 1 % ophthalmic solution  . B Complex-C-Folic Acid (RENA-VITE RX) 1 MG TABS  . carvedilol (COREG) 25 MG tablet  . clopidogrel (PLAVIX) 75 MG tablet  . diclofenac sodium (VOLTAREN) 1 % GEL  . docusate sodium (COLACE) 100 MG capsule  . DULoxetine (CYMBALTA) 30 MG capsule  . fluticasone (FLONASE) 50 MCG/ACT nasal spray  . gabapentin (NEURONTIN) 100 MG capsule  . insulin aspart (NOVOLOG FLEXPEN) 100 UNIT/ML FlexPen  . lanthanum (FOSRENOL) 1000 MG chewable tablet  . lisinopril (PRINIVIL,ZESTRIL) 40 MG tablet  . mupirocin ointment (BACTROBAN) 2 %  . omeprazole (PRILOSEC) 40 MG capsule  . prednisoLONE acetate (PRED FORTE) 1 % ophthalmic suspension  . Alcohol Swabs (B-D SINGLE USE SWABS REGULAR) PADS  . aspirin EC 81 MG tablet  . BD PEN NEEDLE NANO 2ND GEN 32G X 4 MM MISC  . darbepoetin (ARANESP) 200 MCG/0.4ML SOLN  injection  . doxercalciferol (HECTOROL) 4 MCG/2ML injection  . Methoxy PEG-Epoetin Beta (MIRCERA IJ)  . ONETOUCH VERIO test strip     Myra Gianotti, PA-C Surgical Short Stay/Anesthesiology [facility] Phone [phone] [facility] Phone [phone] 08/29/2019 1:50 PM

## 2019-08-30 ENCOUNTER — Encounter (HOSPITAL_COMMUNITY): Payer: Self-pay | Admitting: Vascular Surgery

## 2019-08-30 ENCOUNTER — Other Ambulatory Visit: Payer: Self-pay

## 2019-08-30 ENCOUNTER — Inpatient Hospital Stay (HOSPITAL_COMMUNITY): Payer: Medicare Other | Admitting: Vascular Surgery

## 2019-08-30 ENCOUNTER — Encounter (HOSPITAL_COMMUNITY)
Admission: RE | Disposition: A | Discharge status: Home Health | Payer: Self-pay | Source: Home / Self Care | Attending: Vascular Surgery

## 2019-08-30 ENCOUNTER — Inpatient Hospital Stay (HOSPITAL_COMMUNITY)
Admission: RE | Admit: 2019-08-30 | Discharge: 2019-09-02 | DRG: 239 | Disposition: A | Discharge status: Home Health | Payer: Medicare Other | Attending: Vascular Surgery | Admitting: Vascular Surgery

## 2019-08-30 DIAGNOSIS — H409 Unspecified glaucoma: Secondary | ICD-10-CM | POA: Diagnosis present

## 2019-08-30 DIAGNOSIS — Z833 Family history of diabetes mellitus: Secondary | ICD-10-CM | POA: Diagnosis not present

## 2019-08-30 DIAGNOSIS — H543 Unqualified visual loss, both eyes: Secondary | ICD-10-CM | POA: Diagnosis present

## 2019-08-30 DIAGNOSIS — I5042 Chronic combined systolic (congestive) and diastolic (congestive) heart failure: Secondary | ICD-10-CM | POA: Diagnosis present

## 2019-08-30 DIAGNOSIS — I132 Hypertensive heart and chronic kidney disease with heart failure and with stage 5 chronic kidney disease, or end stage renal disease: Secondary | ICD-10-CM | POA: Diagnosis present

## 2019-08-30 DIAGNOSIS — N186 End stage renal disease: Secondary | ICD-10-CM | POA: Diagnosis present

## 2019-08-30 DIAGNOSIS — I2510 Atherosclerotic heart disease of native coronary artery without angina pectoris: Secondary | ICD-10-CM | POA: Diagnosis present

## 2019-08-30 DIAGNOSIS — E1142 Type 2 diabetes mellitus with diabetic polyneuropathy: Secondary | ICD-10-CM | POA: Diagnosis present

## 2019-08-30 DIAGNOSIS — E1122 Type 2 diabetes mellitus with diabetic chronic kidney disease: Secondary | ICD-10-CM | POA: Diagnosis present

## 2019-08-30 DIAGNOSIS — Z841 Family history of disorders of kidney and ureter: Secondary | ICD-10-CM

## 2019-08-30 DIAGNOSIS — I70262 Atherosclerosis of native arteries of extremities with gangrene, left leg: Secondary | ICD-10-CM | POA: Diagnosis not present

## 2019-08-30 DIAGNOSIS — L97929 Non-pressure chronic ulcer of unspecified part of left lower leg with unspecified severity: Secondary | ICD-10-CM | POA: Diagnosis not present

## 2019-08-30 DIAGNOSIS — Z86010 Personal history of colonic polyps: Secondary | ICD-10-CM | POA: Diagnosis not present

## 2019-08-30 DIAGNOSIS — I10 Essential (primary) hypertension: Secondary | ICD-10-CM | POA: Diagnosis not present

## 2019-08-30 DIAGNOSIS — Z794 Long term (current) use of insulin: Secondary | ICD-10-CM

## 2019-08-30 DIAGNOSIS — R451 Restlessness and agitation: Secondary | ICD-10-CM | POA: Diagnosis not present

## 2019-08-30 DIAGNOSIS — Z90710 Acquired absence of both cervix and uterus: Secondary | ICD-10-CM | POA: Diagnosis not present

## 2019-08-30 DIAGNOSIS — E8889 Other specified metabolic disorders: Secondary | ICD-10-CM | POA: Diagnosis present

## 2019-08-30 DIAGNOSIS — I959 Hypotension, unspecified: Secondary | ICD-10-CM | POA: Diagnosis not present

## 2019-08-30 DIAGNOSIS — Z79899 Other long term (current) drug therapy: Secondary | ICD-10-CM

## 2019-08-30 DIAGNOSIS — Z923 Personal history of irradiation: Secondary | ICD-10-CM

## 2019-08-30 DIAGNOSIS — E11621 Type 2 diabetes mellitus with foot ulcer: Secondary | ICD-10-CM | POA: Diagnosis present

## 2019-08-30 DIAGNOSIS — Z8249 Family history of ischemic heart disease and other diseases of the circulatory system: Secondary | ICD-10-CM

## 2019-08-30 DIAGNOSIS — K219 Gastro-esophageal reflux disease without esophagitis: Secondary | ICD-10-CM | POA: Diagnosis present

## 2019-08-30 DIAGNOSIS — Z91040 Latex allergy status: Secondary | ICD-10-CM

## 2019-08-30 DIAGNOSIS — I96 Gangrene, not elsewhere classified: Secondary | ICD-10-CM | POA: Diagnosis not present

## 2019-08-30 DIAGNOSIS — Z7902 Long term (current) use of antithrombotics/antiplatelets: Secondary | ICD-10-CM

## 2019-08-30 DIAGNOSIS — Z7982 Long term (current) use of aspirin: Secondary | ICD-10-CM

## 2019-08-30 DIAGNOSIS — D631 Anemia in chronic kidney disease: Secondary | ICD-10-CM | POA: Diagnosis present

## 2019-08-30 DIAGNOSIS — M2550 Pain in unspecified joint: Secondary | ICD-10-CM | POA: Diagnosis not present

## 2019-08-30 DIAGNOSIS — Z91048 Other nonmedicinal substance allergy status: Secondary | ICD-10-CM

## 2019-08-30 DIAGNOSIS — Z20822 Contact with and (suspected) exposure to covid-19: Secondary | ICD-10-CM | POA: Diagnosis present

## 2019-08-30 DIAGNOSIS — Z992 Dependence on renal dialysis: Secondary | ICD-10-CM | POA: Diagnosis not present

## 2019-08-30 DIAGNOSIS — Z79891 Long term (current) use of opiate analgesic: Secondary | ICD-10-CM

## 2019-08-30 DIAGNOSIS — Z853 Personal history of malignant neoplasm of breast: Secondary | ICD-10-CM

## 2019-08-30 DIAGNOSIS — I739 Peripheral vascular disease, unspecified: Secondary | ICD-10-CM | POA: Diagnosis not present

## 2019-08-30 DIAGNOSIS — Z7401 Bed confinement status: Secondary | ICD-10-CM | POA: Diagnosis not present

## 2019-08-30 DIAGNOSIS — Z9221 Personal history of antineoplastic chemotherapy: Secondary | ICD-10-CM

## 2019-08-30 DIAGNOSIS — Z885 Allergy status to narcotic agent status: Secondary | ICD-10-CM

## 2019-08-30 DIAGNOSIS — L97524 Non-pressure chronic ulcer of other part of left foot with necrosis of bone: Secondary | ICD-10-CM | POA: Diagnosis present

## 2019-08-30 DIAGNOSIS — Z89512 Acquired absence of left leg below knee: Secondary | ICD-10-CM | POA: Diagnosis not present

## 2019-08-30 DIAGNOSIS — Z9012 Acquired absence of left breast and nipple: Secondary | ICD-10-CM

## 2019-08-30 DIAGNOSIS — E1152 Type 2 diabetes mellitus with diabetic peripheral angiopathy with gangrene: Secondary | ICD-10-CM | POA: Diagnosis present

## 2019-08-30 DIAGNOSIS — L089 Local infection of the skin and subcutaneous tissue, unspecified: Secondary | ICD-10-CM | POA: Diagnosis not present

## 2019-08-30 DIAGNOSIS — N2581 Secondary hyperparathyroidism of renal origin: Secondary | ICD-10-CM | POA: Diagnosis present

## 2019-08-30 DIAGNOSIS — N250 Renal osteodystrophy: Secondary | ICD-10-CM | POA: Diagnosis not present

## 2019-08-30 HISTORY — DX: Peripheral vascular disease, unspecified: I73.9

## 2019-08-30 HISTORY — DX: Family history of other specified conditions: Z84.89

## 2019-08-30 HISTORY — PX: AMPUTATION: SHX166

## 2019-08-30 LAB — GLUCOSE, CAPILLARY
Glucose-Capillary: 129 mg/dL — ABNORMAL HIGH (ref 70–99)
Glucose-Capillary: 144 mg/dL — ABNORMAL HIGH (ref 70–99)
Glucose-Capillary: 88 mg/dL (ref 70–99)
Glucose-Capillary: 133 mg/dL — ABNORMAL HIGH (ref 70–99)
Glucose-Capillary: 134 mg/dL — ABNORMAL HIGH (ref 70–99)

## 2019-08-30 LAB — BASIC METABOLIC PANEL
Anion gap: 18 — ABNORMAL HIGH (ref 5–15)
BUN: 37 mg/dL — ABNORMAL HIGH (ref 8–23)
CO2: 28 mmol/L (ref 22–32)
Calcium: 8.6 mg/dL — ABNORMAL LOW (ref 8.9–10.3)
Chloride: 92 mmol/L — ABNORMAL LOW (ref 98–111)
Creatinine, Ser: 8.78 mg/dL — ABNORMAL HIGH (ref 0.44–1.00)
GFR calc Af Amer: 5 mL/min — ABNORMAL LOW (ref >60)
GFR calc non Af Amer: 4 mL/min — ABNORMAL LOW (ref >60)
Glucose, Bld: 131 mg/dL — ABNORMAL HIGH (ref 70–99)
Potassium: 4.2 mmol/L (ref 3.5–5.1)
Sodium: 138 mmol/L (ref 135–145)

## 2019-08-30 LAB — POCT I-STAT, CHEM 8
BUN: 34 mg/dL — ABNORMAL HIGH (ref 8–23)
Calcium, Ion: 1.05 mmol/L — ABNORMAL LOW (ref 1.15–1.40)
Chloride: 95 mmol/L — ABNORMAL LOW (ref 98–111)
Creatinine, Ser: 8.6 mg/dL — ABNORMAL HIGH (ref 0.44–1.00)
Glucose, Bld: 128 mg/dL — ABNORMAL HIGH (ref 70–99)
HCT: 32 % — ABNORMAL LOW (ref 36.0–46.0)
Hemoglobin: 10.9 g/dL — ABNORMAL LOW (ref 12.0–15.0)
Potassium: 3.7 mmol/L (ref 3.5–5.1)
Sodium: 137 mmol/L (ref 135–145)
TCO2: 29 mmol/L (ref 22–32)

## 2019-08-30 LAB — CBC
HCT: 29 % — ABNORMAL LOW (ref 36.0–46.0)
Hemoglobin: 9.7 g/dL — ABNORMAL LOW (ref 12.0–15.0)
MCH: 31.2 pg (ref 26.0–34.0)
MCHC: 33.4 g/dL (ref 30.0–36.0)
MCV: 93.2 fL (ref 80.0–100.0)
Platelets: 213 10*3/uL (ref 150–400)
RBC: 3.11 MIL/uL — ABNORMAL LOW (ref 3.87–5.11)
RDW: 17.9 % — ABNORMAL HIGH (ref 11.5–15.5)
WBC: 9.6 10*3/uL (ref 4.0–10.5)
nRBC: 0 % (ref 0.0–0.2)

## 2019-08-30 SURGERY — AMPUTATION BELOW KNEE
Anesthesia: General | Site: Knee | Laterality: Left

## 2019-08-30 MED ORDER — ROCURONIUM 10MG/ML (10ML) SYRINGE FOR MEDFUSION PUMP - OPTIME
INTRAVENOUS | Status: DC | PRN
  Administered 2019-08-30 (×2): 20 mg via INTRAVENOUS

## 2019-08-30 MED ORDER — PHENOL 1.4 % MT LIQD: 1.0000 | OROMUCOSAL | Status: DC | PRN

## 2019-08-30 MED ORDER — GUAIFENESIN-DM 100-10 MG/5ML PO SYRP: 15.0000 mL | ORAL_SOLUTION | ORAL | Status: DC | PRN

## 2019-08-30 MED ORDER — ONDANSETRON HCL 4 MG/2ML IJ SOLN: 4.0000 mg | Freq: Once | INTRAMUSCULAR | Status: DC | PRN

## 2019-08-30 MED ORDER — BISACODYL 10 MG RE SUPP: 10.0000 mg | Freq: Every day | RECTAL | Status: DC | PRN

## 2019-08-30 MED ORDER — POLYETHYLENE GLYCOL 3350 17 G PO PACK: 17.0000 g | PACK | Freq: Every day | ORAL | Status: DC | PRN

## 2019-08-30 MED ORDER — MIDAZOLAM HCL 2 MG/2ML IJ SOLN
INTRAMUSCULAR | Status: DC | PRN
  Administered 2019-08-30: 1 mg via INTRAVENOUS

## 2019-08-30 MED ORDER — HYDROMORPHONE HCL 1 MG/ML IJ SOLN
0.2500 mg | INTRAMUSCULAR | Status: DC | PRN
  Administered 2019-08-30 (×4): 0.25 mg via INTRAVENOUS

## 2019-08-30 MED ORDER — CHLORHEXIDINE GLUCONATE 4 % EX LIQD: 60.0000 mL | Freq: Once | CUTANEOUS | Status: DC

## 2019-08-30 MED ORDER — ACETAMINOPHEN 325 MG PO TABS
325.0000 mg | ORAL_TABLET | ORAL | Status: DC | PRN
  Administered 2019-08-30 – 2019-09-02 (×5): 650 mg via ORAL
  Filled 2019-08-30 (×6): qty 2

## 2019-08-30 MED ORDER — ONDANSETRON HCL 4 MG/2ML IJ SOLN
INTRAMUSCULAR | Status: AC
  Filled 2019-08-30: qty 2

## 2019-08-30 MED ORDER — AMLODIPINE BESYLATE 5 MG PO TABS: 5.0000 mg | ORAL_TABLET | Freq: Every day | ORAL | Status: DC

## 2019-08-30 MED ORDER — PANTOPRAZOLE SODIUM 40 MG PO TBEC
40.0000 mg | DELAYED_RELEASE_TABLET | Freq: Two times a day (BID) | ORAL | Status: DC
  Administered 2019-08-30 – 2019-09-02 (×5): 40 mg via ORAL
  Filled 2019-08-30 (×6): qty 1

## 2019-08-30 MED ORDER — CARVEDILOL 25 MG PO TABS
25.0000 mg | ORAL_TABLET | Freq: Two times a day (BID) | ORAL | Status: DC
  Administered 2019-08-30 – 2019-09-02 (×4): 25 mg via ORAL
  Filled 2019-08-30 (×6): qty 1

## 2019-08-30 MED ORDER — ASPIRIN EC 81 MG PO TBEC
81.0000 mg | DELAYED_RELEASE_TABLET | Freq: Every day | ORAL | Status: DC
  Administered 2019-09-02: 09:00:00 81 mg via ORAL
  Filled 2019-08-30 (×2): qty 1

## 2019-08-30 MED ORDER — CLOPIDOGREL BISULFATE 75 MG PO TABS
75.0000 mg | ORAL_TABLET | Freq: Every day | ORAL | Status: DC
  Administered 2019-09-02 (×2): 75 mg via ORAL
  Filled 2019-08-30 (×2): qty 1

## 2019-08-30 MED ORDER — ATROPINE SULFATE 1 % OP SOLN
1.0000 [drp] | Freq: Two times a day (BID) | OPHTHALMIC | Status: DC
  Administered 2019-08-30 – 2019-09-02 (×5): 1 [drp] via OPHTHALMIC
  Filled 2019-08-30: qty 2

## 2019-08-30 MED ORDER — HYDRALAZINE HCL 20 MG/ML IJ SOLN: 5.0000 mg | INTRAMUSCULAR | Status: DC | PRN

## 2019-08-30 MED ORDER — HYDROMORPHONE HCL 1 MG/ML IJ SOLN
0.5000 mg | INTRAMUSCULAR | Status: DC | PRN
  Administered 2019-08-30 – 2019-09-01 (×8): 0.5 mg via INTRAVENOUS
  Filled 2019-08-30 (×8): qty 1

## 2019-08-30 MED ORDER — LANTHANUM CARBONATE 500 MG PO CHEW
1000.0000 mg | CHEWABLE_TABLET | ORAL | Status: DC | PRN
  Filled 2019-08-30: qty 2

## 2019-08-30 MED ORDER — 0.9 % SODIUM CHLORIDE (POUR BTL) OPTIME
TOPICAL | Status: DC | PRN
  Administered 2019-08-30: 08:00:00 1000 mL

## 2019-08-30 MED ORDER — ONDANSETRON HCL 4 MG/2ML IJ SOLN
INTRAMUSCULAR | Status: DC | PRN
  Administered 2019-08-30: 4 mg via INTRAVENOUS

## 2019-08-30 MED ORDER — FENTANYL CITRATE (PF) 250 MCG/5ML IJ SOLN
INTRAMUSCULAR | Status: AC
  Filled 2019-08-30: qty 5

## 2019-08-30 MED ORDER — PHENYLEPHRINE HCL-NACL 10-0.9 MG/250ML-% IV SOLN
INTRAVENOUS | Status: DC | PRN
  Administered 2019-08-30: 25 ug/min via INTRAVENOUS

## 2019-08-30 MED ORDER — GLYCOPYRROLATE 0.2 MG/ML IJ SOLN
INTRAMUSCULAR | Status: DC | PRN
  Administered 2019-08-30: .8 mg via INTRAVENOUS

## 2019-08-30 MED ORDER — CALCITRIOL 0.5 MCG PO CAPS: 1.2500 ug | ORAL_CAPSULE | ORAL | Status: DC

## 2019-08-30 MED ORDER — LANTHANUM CARBONATE 500 MG PO CHEW
3000.0000 mg | CHEWABLE_TABLET | Freq: Three times a day (TID) | ORAL | Status: DC
  Administered 2019-08-30 – 2019-09-02 (×8): 3000 mg via ORAL
  Filled 2019-08-30 (×12): qty 6

## 2019-08-30 MED ORDER — INSULIN ASPART 100 UNIT/ML ~~LOC~~ SOLN
0.0000 [IU] | Freq: Three times a day (TID) | SUBCUTANEOUS | Status: DC
  Administered 2019-08-30 – 2019-08-31 (×2): 2 [IU] via SUBCUTANEOUS
  Administered 2019-09-01 – 2019-09-02 (×4): 3 [IU] via SUBCUTANEOUS

## 2019-08-30 MED ORDER — LIDOCAINE 2% (20 MG/ML) 5 ML SYRINGE
INTRAMUSCULAR | Status: DC | PRN
  Administered 2019-08-30: 100 mg via INTRAVENOUS

## 2019-08-30 MED ORDER — GABAPENTIN 100 MG PO CAPS
100.0000 mg | ORAL_CAPSULE | Freq: Two times a day (BID) | ORAL | Status: DC
  Administered 2019-08-30 – 2019-09-02 (×5): 100 mg via ORAL
  Filled 2019-08-30 (×6): qty 1

## 2019-08-30 MED ORDER — LABETALOL HCL 5 MG/ML IV SOLN: 10.0000 mg | INTRAVENOUS | Status: DC | PRN

## 2019-08-30 MED ORDER — PROPOFOL 10 MG/ML IV BOLUS
INTRAVENOUS | Status: DC | PRN
  Administered 2019-08-30: 150 mg via INTRAVENOUS

## 2019-08-30 MED ORDER — MEPERIDINE HCL 25 MG/ML IJ SOLN: 6.2500 mg | INTRAMUSCULAR | Status: DC | PRN

## 2019-08-30 MED ORDER — FLUTICASONE PROPIONATE 50 MCG/ACT NA SUSP: 1.0000 | Freq: Every day | NASAL | Status: DC | PRN

## 2019-08-30 MED ORDER — HEPARIN SODIUM (PORCINE) 5000 UNIT/ML IJ SOLN
5000.0000 [IU] | Freq: Three times a day (TID) | INTRAMUSCULAR | Status: DC
  Administered 2019-08-31 – 2019-09-02 (×8): 5000 [IU] via SUBCUTANEOUS
  Filled 2019-08-30 (×8): qty 1

## 2019-08-30 MED ORDER — NEOSTIGMINE METHYLSULFATE 10 MG/10ML IV SOLN
INTRAVENOUS | Status: DC | PRN
  Administered 2019-08-30: 5 mg via INTRAVENOUS

## 2019-08-30 MED ORDER — METOPROLOL TARTRATE 5 MG/5ML IV SOLN: 2.0000 mg | INTRAVENOUS | Status: DC | PRN

## 2019-08-30 MED ORDER — POTASSIUM CHLORIDE CRYS ER 20 MEQ PO TBCR: 20.0000 meq | EXTENDED_RELEASE_TABLET | Freq: Every day | ORAL | Status: DC | PRN

## 2019-08-30 MED ORDER — EPHEDRINE SULFATE-NACL 50-0.9 MG/10ML-% IV SOSY
PREFILLED_SYRINGE | INTRAVENOUS | Status: DC | PRN
  Administered 2019-08-30: 15 mg via INTRAVENOUS
  Administered 2019-08-30: 10 mg via INTRAVENOUS
  Administered 2019-08-30: 5 mg via INTRAVENOUS
  Administered 2019-08-30: 15 mg via INTRAVENOUS
  Administered 2019-08-30 (×2): 10 mg via INTRAVENOUS

## 2019-08-30 MED ORDER — HYDROMORPHONE HCL 1 MG/ML IJ SOLN
INTRAMUSCULAR | Status: AC
  Filled 2019-08-30: qty 1

## 2019-08-30 MED ORDER — PHENYLEPHRINE 40 MCG/ML (10ML) SYRINGE FOR IV PUSH (FOR BLOOD PRESSURE SUPPORT)
PREFILLED_SYRINGE | INTRAVENOUS | Status: DC | PRN
  Administered 2019-08-30: 160 ug via INTRAVENOUS
  Administered 2019-08-30: 200 ug via INTRAVENOUS
  Administered 2019-08-30: 160 ug via INTRAVENOUS

## 2019-08-30 MED ORDER — MIDAZOLAM HCL 2 MG/2ML IJ SOLN
INTRAMUSCULAR | Status: AC
  Filled 2019-08-30: qty 2

## 2019-08-30 MED ORDER — DULOXETINE HCL 30 MG PO CPEP
30.0000 mg | ORAL_CAPSULE | Freq: Every day | ORAL | Status: DC
  Administered 2019-09-02: 09:00:00 30 mg via ORAL
  Filled 2019-08-30 (×2): qty 1

## 2019-08-30 MED ORDER — FENTANYL CITRATE (PF) 250 MCG/5ML IJ SOLN
INTRAMUSCULAR | Status: DC | PRN
  Administered 2019-08-30 (×3): 50 ug via INTRAVENOUS

## 2019-08-30 MED ORDER — PROPOFOL 10 MG/ML IV BOLUS
INTRAVENOUS | Status: AC
  Filled 2019-08-30: qty 20

## 2019-08-30 MED ORDER — ONDANSETRON HCL 4 MG/2ML IJ SOLN: 4.0000 mg | Freq: Four times a day (QID) | INTRAMUSCULAR | Status: DC | PRN

## 2019-08-30 MED ORDER — CEFAZOLIN SODIUM-DEXTROSE 2-4 GM/100ML-% IV SOLN
2.0000 g | Freq: Three times a day (TID) | INTRAVENOUS | Status: AC
  Administered 2019-08-30: 21:00:00 2 g via INTRAVENOUS
  Filled 2019-08-30: qty 100

## 2019-08-30 MED ORDER — LISINOPRIL 20 MG PO TABS
40.0000 mg | ORAL_TABLET | Freq: Every day | ORAL | Status: DC
  Administered 2019-08-30: 20:00:00 40 mg via ORAL
  Filled 2019-08-30: qty 2

## 2019-08-30 MED ORDER — SODIUM CHLORIDE 0.9 % IV SOLN: INTRAVENOUS | Status: DC

## 2019-08-30 MED ORDER — ACETAMINOPHEN 650 MG RE SUPP: 325.0000 mg | RECTAL | Status: DC | PRN

## 2019-08-30 MED ORDER — DOCUSATE SODIUM 100 MG PO CAPS
100.0000 mg | ORAL_CAPSULE | Freq: Two times a day (BID) | ORAL | Status: DC
  Administered 2019-08-30 – 2019-09-02 (×5): 100 mg via ORAL
  Filled 2019-08-30 (×6): qty 1

## 2019-08-30 MED ORDER — DOXERCALCIFEROL 4 MCG/2ML IV SOLN
4.0000 ug | INTRAVENOUS | Status: DC
  Administered 2019-08-31 – 2019-09-02 (×2): 4 ug via INTRAVENOUS
  Filled 2019-08-30 (×2): qty 2

## 2019-08-30 MED ORDER — RENA-VITE PO TABS
1.0000 | ORAL_TABLET | Freq: Every day | ORAL | Status: DC
  Administered 2019-08-30 – 2019-09-02 (×4): 1 via ORAL
  Filled 2019-08-30 (×4): qty 1

## 2019-08-30 MED ORDER — MAGNESIUM SULFATE 2 GM/50ML IV SOLN
2.0000 g | Freq: Every day | INTRAVENOUS | Status: DC | PRN
  Filled 2019-08-30: qty 50

## 2019-08-30 MED ORDER — ALBUTEROL SULFATE (2.5 MG/3ML) 0.083% IN NEBU: 2.5000 mg | INHALATION_SOLUTION | Freq: Four times a day (QID) | RESPIRATORY_TRACT | Status: DC | PRN

## 2019-08-30 MED ORDER — PREDNISOLONE ACETATE 1 % OP SUSP
1.0000 [drp] | Freq: Two times a day (BID) | OPHTHALMIC | Status: DC
  Administered 2019-08-30 – 2019-09-02 (×4): 1 [drp] via OPHTHALMIC
  Filled 2019-08-30: qty 5

## 2019-08-30 MED ORDER — CEFAZOLIN SODIUM-DEXTROSE 2-4 GM/100ML-% IV SOLN
2.0000 g | INTRAVENOUS | Status: AC
  Administered 2019-08-30: 2 g via INTRAVENOUS
  Filled 2019-08-30: qty 100

## 2019-08-30 SURGICAL SUPPLY — 57 items
BANDAGE ESMARK 6X9 LF (GAUZE/BANDAGES/DRESSINGS) ×1 IMPLANT
BLADE SAW RECIP 87.9 MT (BLADE) ×2 IMPLANT
BNDG CMPR 9X6 STRL LF SNTH (GAUZE/BANDAGES/DRESSINGS) ×1
BNDG COHESIVE 6X5 TAN STRL LF (GAUZE/BANDAGES/DRESSINGS) ×2 IMPLANT
BNDG ELASTIC 6X5.8 VLCR STR LF (GAUZE/BANDAGES/DRESSINGS) ×2 IMPLANT
BNDG ESMARK 6X9 LF (GAUZE/BANDAGES/DRESSINGS) ×2
BNDG GAUZE ELAST 4 BULKY (GAUZE/BANDAGES/DRESSINGS) ×3 IMPLANT
CANISTER SUCT 3000ML PPV (MISCELLANEOUS) ×2 IMPLANT
CLIP VESOCCLUDE MED 6/CT (CLIP) ×1 IMPLANT
COVER BACK TABLE 60X90IN (DRAPES) ×2 IMPLANT
COVER SURGICAL LIGHT HANDLE (MISCELLANEOUS) ×2 IMPLANT
COVER WAND RF STERILE (DRAPES) ×1 IMPLANT
CUFF TOURN SGL QUICK 18X4 (TOURNIQUET CUFF) IMPLANT
CUFF TOURN SGL QUICK 24 (TOURNIQUET CUFF)
CUFF TOURN SGL QUICK 34 (TOURNIQUET CUFF) ×2
CUFF TOURN SGL QUICK 42 (TOURNIQUET CUFF) IMPLANT
CUFF TRNQT CYL 24X4X16.5-23 (TOURNIQUET CUFF) IMPLANT
CUFF TRNQT CYL 34X4.125X (TOURNIQUET CUFF) IMPLANT
DRAIN CHANNEL 19F RND (DRAIN) IMPLANT
DRAPE HALF SHEET 40X57 (DRAPES) ×4 IMPLANT
DRAPE ORTHO SPLIT 77X108 STRL (DRAPES) ×4
DRAPE SURG ORHT 6 SPLT 77X108 (DRAPES) ×2 IMPLANT
DRSG ADAPTIC 3X8 NADH LF (GAUZE/BANDAGES/DRESSINGS) ×2 IMPLANT
ELECT REM PT RETURN 9FT ADLT (ELECTROSURGICAL) ×2
ELECTRODE REM PT RTRN 9FT ADLT (ELECTROSURGICAL) ×1 IMPLANT
EVACUATOR SILICONE 100CC (DRAIN) IMPLANT
GAUZE SPONGE 4X4 12PLY STRL (GAUZE/BANDAGES/DRESSINGS) ×4 IMPLANT
GAUZE SPONGE 4X4 12PLY STRL LF (GAUZE/BANDAGES/DRESSINGS) ×1 IMPLANT
GLOVE BIO SURGEON STRL SZ7.5 (GLOVE) ×1 IMPLANT
GLOVE BIOGEL PI IND STRL 6 (GLOVE) IMPLANT
GLOVE BIOGEL PI IND STRL 7.0 (GLOVE) IMPLANT
GLOVE BIOGEL PI INDICATOR 6 (GLOVE) ×2
GLOVE BIOGEL PI INDICATOR 7.0 (GLOVE) ×2
GLOVE SURG SS PI 7.0 STRL IVOR (GLOVE) ×4 IMPLANT
GLOVE SURG SS PI 7.5 STRL IVOR (GLOVE) ×2 IMPLANT
GOWN STRL REUS W/ TWL LRG LVL3 (GOWN DISPOSABLE) ×4 IMPLANT
GOWN STRL REUS W/TWL LRG LVL3 (GOWN DISPOSABLE) ×8
KIT BASIN OR (CUSTOM PROCEDURE TRAY) ×2 IMPLANT
KIT TURNOVER KIT B (KITS) ×2 IMPLANT
NS IRRIG 1000ML POUR BTL (IV SOLUTION) ×2 IMPLANT
PACK GENERAL/GYN (CUSTOM PROCEDURE TRAY) ×2 IMPLANT
PAD ARMBOARD 7.5X6 YLW CONV (MISCELLANEOUS) ×4 IMPLANT
SPONGE LAP 18X18 X RAY DECT (DISPOSABLE) ×3 IMPLANT
STAPLER VISISTAT 35W (STAPLE) ×2 IMPLANT
STOCKINETTE IMPERVIOUS LG (DRAPES) ×2 IMPLANT
SUT ETHILON 3 0 PS 1 (SUTURE) IMPLANT
SUT SILK 2 0 (SUTURE) ×2
SUT SILK 2 0 SH CR/8 (SUTURE) ×3 IMPLANT
SUT SILK 2-0 18XBRD TIE 12 (SUTURE) ×1 IMPLANT
SUT VIC AB 2-0 SH 18 (SUTURE) ×6 IMPLANT
SUT VIC AB 3-0 SH 27 (SUTURE) ×2
SUT VIC AB 3-0 SH 27X BRD (SUTURE) ×1 IMPLANT
SYR BULB IRRIGATION 50ML (SYRINGE) ×2 IMPLANT
TOWEL GREEN STERILE (TOWEL DISPOSABLE) ×3 IMPLANT
TOWEL GREEN STERILE FF (TOWEL DISPOSABLE) ×2 IMPLANT
UNDERPAD 30X30 (UNDERPADS AND DIAPERS) ×2 IMPLANT
WATER STERILE IRR 1000ML POUR (IV SOLUTION) ×2 IMPLANT

## 2019-08-30 NOTE — H&P (Signed)
Patient is a 69 year old female with known peripheral arterial disease. She previously had an angioplasty of her left anterior tibial artery February 2020 to heal up a infected left first toe. This eventually did heal. She has now developed a new ulceration in the left second toe. She is currently being followed at the wound center. 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 She has end-stage renal disease and dialyzes Monday Wednesday Friday. She does not have any problems in her right foot. Her previous arteriogram showed no significant aortoiliac or popliteal occlusive disease bilaterally. She has chronic occlusion of the peroneal and posterior tibial arteries bilaterally. Her only runoff vessel is the anterior tibial artery bilaterally. Chronic medical problems include congestive heart failure, hypertension, neuropathy, diabetes. These are all currently stable. She is on Plavix and aspirin.      Past Medical History:  Diagnosis Date  . Anemia   . Anxiety   . Arthritis    "joints" (06/15/2013)  . Asthma   . 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 Blood transfusion without reported diagnosis   . Breast cancer ([place])    left  . CHF (congestive heart failure) ([place])   . 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 Esophageal ulcer with bleeding   . ESRD (end stage renal disease) ([place])    "suppose to start dialysis today" (06/15/2013)  . GERD (gastroesophageal reflux disease)   . Glaucoma    blind in both eyes  . Heart murmur   . Hx of adenomatous colonic polyps 04/07/2018  . Hypertension   . Myalgia 12/31/2011  . Neuropathy 12/31/2011  . Shortness of breath    "when she doesn't go to dialysis"  . Type II diabetes mellitus ([place])         Past Surgical History:  Procedure Laterality Date  . A/V FISTULAGRAM N/A 09/21/2018   Procedure: A/V FISTULAGRAM - Right Upper; Surgeon: [name], MD; Location: [place] CV LAB; Service: Cardiovascular; Laterality: N/A;  .  ABDOMINAL AORTOGRAM N/A 11/30/2018   Procedure: ABDOMINAL AORTOGRAM; Surgeon: [name], MD; Location: [place] CV LAB; Service: Cardiovascular; Laterality: N/A;  . ABDOMINAL HYSTERECTOMY     partial  . AV FISTULA PLACEMENT Right 06/06/2013   Procedure: ARTERIOVENOUS (AV) FISTULA CREATION-RIGHT BRACHIAL CEPHALIC; Surgeon: Conrad [place], MD; Location: [place]; Service: Vascular; Laterality: Right;  . BREAST BIOPSY Left   . BREAST LUMPECTOMY Left    "and took out some lymph nodes" (06/15/2013)  . CARPAL TUNNEL RELEASE Left 11/26/2017   Procedure: LEFT CARPAL TUNNEL RELEASE; Surgeon: [name], MD; Location: [place]; Service: Orthopedics; Laterality: Left;  . CATARACT EXTRACTION W/ ANTERIOR VITRECTOMY Bilateral   . CESAREAN SECTION  1980  . COLONOSCOPY    . ESOPHAGOGASTRODUODENOSCOPY N/A 07/08/2017   Procedure: ESOPHAGOGASTRODUODENOSCOPY (EGD); Surgeon: [name], MD; Location: [facility] ENDOSCOPY; Service: Endoscopy; Laterality: N/A;  . ESOPHAGOGASTRODUODENOSCOPY (EGD) WITH PROPOFOL N/A 02/07/2018   Procedure: ESOPHAGOGASTRODUODENOSCOPY (EGD) WITH PROPOFOL; Surgeon: [name], MD; Location: [facility] ENDOSCOPY; Service: Gastroenterology; Laterality: N/A; APC and clips placed  . EYE SURGERY Bilateral    laser surgery  . LOWER EXTREMITY ANGIOGRAPHY Bilateral 11/30/2018   Procedure: LOWER EXTREMITY ANGIOGRAPHY; Surgeon: [name], MD; Location: [place] CV LAB; Service: Cardiovascular; Laterality: Bilateral;  . PARS PLANA VITRECTOMY Right 05/05/2017   Procedure: PARS PLANA VITRECTOMY WITH 25 GAUGE; PARTIAL REMOVAL OF OIL; INFERIOR PERIPHERAL IRIDECTOMY, REFORM ANTERIOR CHAMBER RIGHT EYE; Surgeon: [name], MD; Location:  [place] OR; Service: Ophthalmology; Laterality: Right;  . PERIPHERAL VASCULAR BALLOON ANGIOPLASTY Right 09/21/2018   Procedure: PERIPHERAL VASCULAR BALLOON ANGIOPLASTY; Surgeon: [name], MD; Location: [place] CV LAB; Service: Cardiovascular;  Laterality: Right; AV fistula  . PERIPHERAL VASCULAR BALLOON ANGIOPLASTY Left 11/30/2018   Procedure: PERIPHERAL VASCULAR BALLOON ANGIOPLASTY; Surgeon: [name], MD; Location: [place] CV LAB; Service: Cardiovascular; Laterality: Left; Left Anterior Tibial Artery  . REFRACTIVE SURGERY Bilateral   . REMOVAL OF A DIALYSIS CATHETER Right 06/06/2013   Procedure: REMOVAL OF RIGHT MEDIPORT; Surgeon: Conrad [place], MD; Location: [place]; Service: Vascular; Laterality: Right;  . TONSILLECTOMY    . UPPER GASTROINTESTINAL ENDOSCOPY           Current Outpatient Medications on File Prior to Visit  Medication Sig Dispense Refill  . acetaminophen (TYLENOL) 500 MG tablet Take 500 mg by mouth 2 (two) times a day.     . albuterol (PROVENTIL) (2.5 MG/3ML) 0.083% nebulizer solution Take 3 mLs (2.5 mg total) by nebulization every 6 (six) hours as needed for wheezing or shortness of breath. 75 mL 12  . Alcohol Swabs (B-D SINGLE USE SWABS REGULAR) PADS USE UTD FOR INSULIN INJECTIONS TID    . amLODipine (NORVASC) 5 MG tablet Take 5 mg by mouth daily.     Marland Kitchen amoxicillin-clavulanate (AUGMENTIN) 500-125 MG tablet Take 1 tablet by mouth qd on Tuesday, Thursday and Saturday. 10 tablet 0  . aspirin 81 MG chewable tablet Chew 81 mg by mouth daily.     Marland Kitchen atropine 1 % ophthalmic solution Place 1 drop into the right eye 2 (two) times daily.  0  . B Complex-C-Folic Acid (RENA-VITE RX) 1 MG TABS Take 1 tablet by mouth daily with lunch.   1  . BD PEN NEEDLE NANO 2ND GEN 32G X 4 MM MISC USE WITH INSULIN PEN 3 TIMES A DAY    . carvedilol (COREG) 25 MG tablet Take 25 mg by mouth 2 (two) times daily.  2  . clopidogrel (PLAVIX) 75 MG tablet Take 1 tablet (75 mg total) by mouth daily. 30 tablet 11  . darbepoetin (ARANESP) 200 MCG/0.4ML SOLN injection Inject 0.4 mLs (200 mcg total) into the vein every Wednesday with hemodialysis. 1.68 mL   . diclofenac sodium (VOLTAREN) 1 % GEL APP EXT AA UP TO QID    . docusate sodium  (COLACE) 100 MG capsule Take 100 mg by mouth daily.    Marland Kitchen doxercalciferol (HECTOROL) 4 MCG/2ML injection Inject 0.5 mLs (1 mcg total) into the vein every Monday, Wednesday, and Friday with hemodialysis. 2 mL   . DULoxetine (CYMBALTA) 30 MG capsule TK 1 C PO QD    . fluticasone (FLONASE) 50 MCG/ACT nasal spray SHAKE LQ AND U 2 SPRAYS IEN QD    . gabapentin (NEURONTIN) 100 MG capsule Take 100 mg by mouth 2 (two) times daily.    . insulin aspart (NOVOLOG FLEXPEN) 100 UNIT/ML FlexPen Inject 6-10 Units into the skin 3 (three) times daily with meals. per Sliding scale    . lanthanum (FOSRENOL) 1000 MG chewable tablet Chew 2,000 mg by mouth 3 (three) times daily with meals.     Marland Kitchen lisinopril (PRINIVIL,ZESTRIL) 40 MG tablet Take 40 mg by mouth at bedtime.     Marland Kitchen omeprazole (PRILOSEC) 40 MG capsule Take 1 capsule (40 mg total) by mouth daily. (Patient taking differently: Take 40 mg by mouth 2 (two) times daily. )    . ONETOUCH VERIO test strip 3 (three) times  daily. for testing  9  . pantoprazole (PROTONIX) 40 MG tablet Take by mouth.    . prednisoLONE acetate (PRED FORTE) 1 % ophthalmic suspension Place 1 drop into the right eye 2 (two) times daily.   0  . Sennosides (SENNA) 8.8 MG/5ML SYRP Take by mouth.    . traMADol (ULTRAM) 50 MG tablet Take by mouth.              Current Facility-Administered Medications on File Prior to Visit  Medication Dose Route Frequency Provider Last Rate Last Admin  . 0.9 % sodium chloride infusion 500 mL Intravenous Once [name], MD    . dexamethasone (DECADRON) injection 4 mg 4 mg Intramuscular Once Hilts, Michael, MD     Review of systems: She does not have shortness of breath. She does not have chest pain. However, she is minimally ambulatory.  Physical exam:   Vitals:   08/30/19 0620  BP: (!) 115/55  Pulse: 69  Resp: 19  SpO2: 90%  Weight: 94.3 kg  Height: 5\' 5"  (1.651 m)   Extremities: No palpable pedal pulses bilaterally  Skin: Ulceration with skin  necrosis nearly down to the bone on the medial aspect of the left second toe. There is also a small darkened area at the tip of the left third toe which is a healing ulcer but also has some characteristics consistent with dry gangrene    Data: Patient had an arterial duplex exam today which showed occlusion of her posterior tibial artery bilaterally. The left anterior tibial artery had biphasic flow proximally but monophasic distally. The right anterior tibial artery had monophasic flow diffusely. Toe pressure on the left was 18. Toe pressure on the right side was 84. I reviewed and interpreted the studies.    Assessment: Severe tibial artery occlusive disease now with new nonhealing wound left foot.   Plan: Left BKA  Procedure details risk benefits possible complications of BKA were discussed with patient today. She understands and agrees to proceed.    Ruta Hinds, MD  [facility]  Office: [phone]

## 2019-08-30 NOTE — Anesthesia Procedure Notes (Signed)
Procedure Name: Intubation Date/Time: 08/30/2019 8:01 AM Performed by: Bryson Corona, CRNA Pre-anesthesia Checklist: Patient identified, Emergency Drugs available, Suction available and Patient being monitored Patient Re-evaluated:Patient Re-evaluated prior to induction Oxygen Delivery Method: Circle System Utilized Preoxygenation: Pre-oxygenation with 100% oxygen Induction Type: IV induction Ventilation: Mask ventilation without difficulty Laryngoscope Size: Mac and 3 Grade View: Grade I Tube type: Oral Tube size: 7.0 mm Number of attempts: 1 Airway Equipment and Method: Stylet and Oral airway Placement Confirmation: ETT inserted through vocal cords under direct vision,  positive ETCO2 and breath sounds checked- equal and bilateral Secured at: 22 cm Tube secured with: Tape Dental Injury: Teeth and Oropharynx as per pre-operative assessment

## 2019-08-30 NOTE — Plan of Care (Addendum)
Bleeding noted to stump, dressing reinforced, elevated on pillow and ice applied. Will cont to monitor.  Problem: Education: Goal: Knowledge of General Education information will improve Description: Including pain rating scale, medication(s)/side effects and non-pharmacologic comfort measures Outcome: Progressing   Problem: Activity: Goal: Risk for activity intolerance will decrease Outcome: Progressing   Problem: Pain Managment: Goal: General experience of comfort will improve Outcome: Progressing   Problem: Safety: Goal: Ability to remain free from injury will improve Outcome: Progressing   Problem: Skin Integrity: Goal: Risk for impaired skin integrity will decrease Outcome: Progressing

## 2019-08-30 NOTE — Anesthesia Postprocedure Evaluation (Signed)
Anesthesia Post Note  Patient: [name]  Procedure(s) Performed: AMPUTATION BELOW KNEE LEFT (Left Knee)     Patient location during evaluation: PACU Anesthesia Type: General Level of consciousness: awake and alert Pain management: pain level controlled Vital Signs Assessment: post-procedure vital signs reviewed and stable Respiratory status: spontaneous breathing, nonlabored ventilation, respiratory function stable and patient connected to nasal cannula oxygen Cardiovascular status: blood pressure returned to baseline and stable Postop Assessment: no apparent nausea or vomiting Anesthetic complications: no    Last Vitals:  Vitals:   08/30/19 1420 08/30/19 1446  BP: (!) 113/37 (!) 100/47  Pulse: 66 69  Resp: 12 16  Temp:  36.5 C  SpO2: 98% 92%    Last Pain:  Vitals:   08/30/19 1500  TempSrc:   PainSc: 5                  Tyrhonda Georgiades DAVID

## 2019-08-30 NOTE — Progress Notes (Signed)
Inpatient Rehabilitation Admissions Coordinator  Inpatient rehab consult received. I await PT and OT evals and will follow up to assist with planning dispo.  Danne Baxter, RN, MSN Rehab Admissions Coordinator [phone] 08/30/2019 6:20 PM

## 2019-08-30 NOTE — Transfer of Care (Signed)
Immediate Anesthesia Transfer of Care Note  Patient: [name]  Procedure(s) Performed: AMPUTATION BELOW KNEE LEFT (Left Knee)  Patient Location: PACU  Anesthesia Type:General  Level of Consciousness: drowsy and patient cooperative  Airway & Oxygen Therapy: Patient Spontanous Breathing and Patient connected to face mask oxygen  Post-op Assessment: Report given to RN and Post -op Vital signs reviewed and stable  Post vital signs: Reviewed and stable  Last Vitals:  Vitals Value Taken Time  BP 112/39 08/30/19 0951  Temp 36.3 C 08/30/19 0950  Pulse 78 08/30/19 0956  Resp 19 08/30/19 0956  SpO2 96 % 08/30/19 0956  Vitals shown include unvalidated device data.  Last Pain:  Vitals:   08/30/19 0950  PainSc: Asleep      Patients Stated Pain Goal: 3 (61/22/44 [id number])  Complications: No apparent anesthesia complications

## 2019-08-30 NOTE — Consult Note (Signed)
[facility] Renal Consultation Note  Indication for Consultation:  Management of ESRD/hemodialysis; anemia, hypertension/volume and secondary hyperparathyroidism  HPI: [name] is a 69 y.o. 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 Seen in room post op , stable  No sob, cp, leg pain, co some Gi intermittent discomfort .  We are consulted  For HD / ESRD needs   k  3.9 today hemoglobin 10.9      Past Medical History:  Diagnosis Date  . Anemia   . Anxiety   . Arthritis    "joints" (06/15/2013)  . Asthma   . 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 Blood transfusion without reported diagnosis   . Breast cancer ([place])    left  . CHF (congestive heart failure) ([place])   . 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 Esophageal ulcer with bleeding   . ESRD (end stage renal disease) ([place])    "suppose to start dialysis today" (06/15/2013)  . Family history of adverse reaction to anesthesia    It took a while for pt sister to wake from anesthesia  . GERD (gastroesophageal reflux disease)   . Glaucoma    blind in both eyes  . Heart murmur    Mild AS, moderate MR, moderate TR 10/30/16 echo  . Hx of adenomatous colonic polyps 04/07/2018  . Hypertension   . Myalgia 12/31/2011  . Neuropathy 12/31/2011  . PAD (peripheral artery disease) (HCC)    nonviable tissue left lower extremity  . Shortness of breath    "when she doesn't go to dialysis"  . Type II diabetes mellitus ([place])   . Walker as ambulation aid     Past Surgical History:  Procedure Laterality Date  . A/V FISTULAGRAM N/A 09/21/2018   Procedure: A/V FISTULAGRAM - Right  Upper;  Surgeon: [name], MD;  Location: [place] [facility];  Service: Cardiovascular;  Laterality: N/A;  . ABDOMINAL AORTOGRAM N/A 11/30/2018   Procedure: ABDOMINAL AORTOGRAM;  Surgeon: [name], MD;  Location: [place] [facility];  Service: Cardiovascular;  Laterality: N/A;  . ABDOMINAL AORTOGRAM W/LOWER EXTREMITY Left 07/29/2019   Procedure: ABDOMINAL AORTOGRAM W/LOWER EXTREMITY;  Surgeon: [name], MD;  Location: [place] [facility];  Service: Cardiovascular;  Laterality: Left;  . ABDOMINAL HYSTERECTOMY     partial  . AV FISTULA PLACEMENT Right 06/06/2013   Procedure: ARTERIOVENOUS (AV) FISTULA CREATION-RIGHT BRACHIAL CEPHALIC;  Surgeon: Conrad Palmyra, MD;  Location: [place];  Service: Vascular;  Laterality: Right;  . BREAST BIOPSY Left   . BREAST LUMPECTOMY Left    "and took out some lymph nodes" (06/15/2013)  . CARDIAC CATHETERIZATION     04/02/15 [facility]): no angiographic CAD, LVEF 40% with global hypokinesis (LHC done for + stress echo, EF 45% 11/28/14)  . CARPAL TUNNEL RELEASE Left 11/26/2017   Procedure: LEFT CARPAL TUNNEL RELEASE;  Surgeon: [name], MD;  Location: [place];  Service: Orthopedics;  Laterality: Left;  . CATARACT EXTRACTION W/ ANTERIOR VITRECTOMY Bilateral   . CESAREAN SECTION  1980  . COLONOSCOPY    . ESOPHAGOGASTRODUODENOSCOPY  N/A 07/08/2017   Procedure: ESOPHAGOGASTRODUODENOSCOPY (EGD);  Surgeon: [name], MD;  Location: [facility] ENDOSCOPY;  Service: Endoscopy;  Laterality: N/A;  . ESOPHAGOGASTRODUODENOSCOPY (EGD) WITH PROPOFOL N/A 02/07/2018   Procedure: ESOPHAGOGASTRODUODENOSCOPY (EGD) WITH PROPOFOL;  Surgeon: [name], MD;  Location: [facility] ENDOSCOPY;  Service: Gastroenterology;  Laterality: N/A;  APC and clips placed  . EYE SURGERY Bilateral    laser surgery  . LOWER EXTREMITY ANGIOGRAPHY Bilateral 11/30/2018   Procedure: LOWER EXTREMITY ANGIOGRAPHY;  Surgeon: [name], MD;  Location: [place] [facility];  Service:  Cardiovascular;  Laterality: Bilateral;  . PARS PLANA VITRECTOMY Right 05/05/2017   Procedure: PARS PLANA VITRECTOMY WITH 25 GAUGE; PARTIAL REMOVAL OF OIL; INFERIOR PERIPHERAL IRIDECTOMY, REFORM ANTERIOR CHAMBER RIGHT EYE;  Surgeon: [name], MD;  Location: [place];  Service: Ophthalmology;  Laterality: Right;  . PERIPHERAL VASCULAR BALLOON ANGIOPLASTY Right 09/21/2018   Procedure: PERIPHERAL VASCULAR BALLOON ANGIOPLASTY;  Surgeon: [name], MD;  Location: [place] [facility];  Service: Cardiovascular;  Laterality: Right;  AV fistula  . PERIPHERAL VASCULAR BALLOON ANGIOPLASTY Left 11/30/2018   Procedure: PERIPHERAL VASCULAR BALLOON ANGIOPLASTY;  Surgeon: [name], MD;  Location: [place] [facility];  Service: Cardiovascular;  Laterality: Left;  Left Anterior Tibial Artery  . REFRACTIVE SURGERY Bilateral   . REMOVAL OF A DIALYSIS CATHETER Right 06/06/2013   Procedure: REMOVAL OF RIGHT MEDIPORT;  Surgeon: Conrad Brownell, MD;  Location: [place];  Service: Vascular;  Laterality: Right;  . TONSILLECTOMY    . UPPER GASTROINTESTINAL ENDOSCOPY        Family History  Problem Relation Age of Onset  . Diabetes Mother   . Hyperlipidemia Mother   . Hypertension Mother   . Hypertension Father   . Diabetes Sister   . Diabetes Brother   . Hypertension Brother   . Heart attack Brother   . Kidney disease Brother   . Colon cancer Neg Hx   . Colon polyps Neg Hx   . Esophageal cancer Neg Hx   . Gallbladder disease Neg Hx   . Rectal cancer Neg Hx   . Stomach cancer Neg Hx       reports that she has never smoked. She has never used smokeless tobacco. She reports that she does not drink alcohol or use drugs.   Allergies  Allergen Reactions  . Latex Hives  . Oxycodone Other (See Comments)    Hallucinations   . Tramadol Other (See Comments)    hallucinations   . Tape Rash    22M Transpore adhesive tape. Medical tape  . Vicodin [Hydrocodone-Acetaminophen] Other (See Comments)     hallucinations    Prior to Admission medications   Medication Sig Start Date End Date Taking? Authorizing Provider  acetaminophen (TYLENOL) 500 MG tablet Take 500 mg by mouth 2 (two) times daily.    Yes [provider]  albuterol (PROVENTIL) (2.5 MG/22ML) 0.083% nebulizer solution Take 3 mLs (2.5 mg total) by nebulization every 6 (six) hours as needed for wheezing or shortness of breath. 10/30/16  Yes Theodis Blaze, MD  amLODipine (NORVASC) 5 MG tablet Take 5 mg by mouth daily.  05/21/18  Yes [provider]  aspirin EC 81 MG tablet Take 81 mg by mouth daily.   Yes [provider]  atropine 1 % ophthalmic solution Place 1 drop into the right eye 2 (two) times daily. 07/04/17  Yes [provider]  B Complex-C-Folic Acid (RENA-VITE RX) 1 MG TABS  Take 1 tablet by mouth daily with lunch.  09/29/16  Yes [provider]  carvedilol (COREG) 25 MG tablet Take 25 mg by mouth 2 (two) times daily. 04/03/18  Yes [provider]  clopidogrel (PLAVIX) 75 MG tablet Take 1 tablet (75 mg total) by mouth daily. Patient taking differently: Take 75 mg by mouth at bedtime.  11/30/18  Yes Fields, Jessy Oto, MD  darbepoetin (ARANESP) 200 MCG/0.4ML SOLN injection Inject 0.4 mLs (200 mcg total) into the vein every Wednesday with hemodialysis. 06/22/13  Yes Eulogio Bear U, DO  diclofenac sodium (VOLTAREN) 1 % GEL Apply 1 application topically 4 (four) times daily as needed (pain.).  04/28/19  Yes [provider]  docusate sodium (COLACE) 100 MG capsule Take 100 mg by mouth 2 (two) times daily.    Yes [provider]  doxercalciferol (HECTOROL) 4 MCG/2ML injection Inject 0.5 mLs (1 mcg total) into the vein every Monday, Wednesday, and Friday with hemodialysis. 06/18/13  Yes Vann, Jessica U, DO  DULoxetine (CYMBALTA) 30 MG capsule Take 30 mg by mouth daily.  11/24/18  Yes [provider]  fluticasone (FLONASE) 50 MCG/ACT nasal spray Place 1-2 sprays  into both nostrils daily as needed for allergies.  02/07/19  Yes [provider]  gabapentin (NEURONTIN) 100 MG capsule Take 100 mg by mouth 2 (two) times daily.   Yes [provider]  insulin aspart (NOVOLOG FLEXPEN) 100 UNIT/ML FlexPen Inject 6-10 Units into the skin 3 (three) times daily with meals. per Sliding scale   Yes [provider]  lanthanum (FOSRENOL) 1000 MG chewable tablet Chew 1,000-3,000 mg by mouth See admin instructions. Take 3 tablets (3000 mg) by mouth with each meal & take 1 tablet (1000 mg) by mouth with each snack   Yes [provider]  lisinopril (PRINIVIL,ZESTRIL) 40 MG tablet Take 40 mg by mouth at bedtime.    Yes [provider]  Methoxy PEG-Epoetin Beta (MIRCERA IJ) Mircera 08/15/19 08/13/20 Yes [provider]  mupirocin ointment (BACTROBAN) 2 % Apply to affected area right foot once daily Patient taking differently: Apply 1 application topically daily. Apply to affected area right foot once daily 08/16/19  Yes [place], Stephani Police, DPM  omeprazole (PRILOSEC) 40 MG capsule Take 1 capsule (40 mg total) by mouth daily. Patient taking differently: Take 40 mg by mouth 2 (two) times daily.  02/09/18  Yes Cherene Altes, MD  prednisoLONE acetate (PRED FORTE) 1 % ophthalmic suspension Place 1 drop into the right eye 2 (two) times daily.  04/27/17  Yes [provider]  Alcohol Swabs (B-D SINGLE USE SWABS REGULAR) PADS USE UTD FOR INSULIN INJECTIONS TID 03/08/19   [provider]  BD PEN NEEDLE NANO 2ND GEN 32G X 4 MM MISC USE WITH INSULIN PEN 3 TIMES A DAY 03/03/19   [provider]  ONETOUCH VERIO test strip 3 (three) times daily. for testing 12/15/17   [provider]     Results for orders placed or performed during the hospital encounter of 08/30/19 (from the past 48 hour(s))  CBC     Status: Abnormal   Collection Time: 08/30/19  6:03 AM  Result Value Ref Range   WBC 9.6 4.0 - 10.5 K/uL    RBC 3.11 (L) 3.87 - 5.11 MIL/uL   Hemoglobin 9.7 (L) 12.0 - 15.0 g/dL   HCT 29.0 (L) 36.0 - 46.0 %   MCV 93.2 80.0 - 100.0 fL   MCH 31.2 26.0 - 34.0 pg  MCHC 33.4 30.0 - 36.0 g/dL   RDW 17.9 (H) 11.5 - 15.5 %   Platelets 213 150 - 400 K/uL   nRBC 0.0 0.0 - 0.2 %    Comment: Performed at [facility] [address], [place], [place] [address]  Basic metabolic panel     Status: Abnormal   Collection Time: 08/30/19  6:03 AM  Result Value Ref Range   Sodium 138 135 - 145 mmol/L   Potassium 4.2 3.5 - 5.1 mmol/L   Chloride 92 (L) 98 - 111 mmol/L   CO2 28 22 - 32 mmol/L   Glucose, Bld 131 (H) 70 - 99 mg/dL   BUN 37 (H) 8 - 23 mg/dL   Creatinine, Ser 8.78 (H) 0.44 - 1.00 mg/dL   Calcium 8.6 (L) 8.9 - 10.3 mg/dL   GFR calc non Af Amer 4 (L) >60 mL/min   GFR calc Af Amer 5 (L) >60 mL/min   Anion gap 18 (H) 5 - 15    Comment: Performed at [facility] [address] [place], [place] [address]  Glucose, capillary     Status: Abnormal   Collection Time: 08/30/19  6:25 AM  Result Value Ref Range   Glucose-Capillary 129 (H) 70 - 99 mg/dL  I-STAT, chem 8     Status: Abnormal   Collection Time: 08/30/19  7:12 AM  Result Value Ref Range   Sodium 137 135 - 145 mmol/L   Potassium 3.7 3.5 - 5.1 mmol/L   Chloride 95 (L) 98 - 111 mmol/L   BUN 34 (H) 8 - 23 mg/dL   Creatinine, Ser 8.60 (H) 0.44 - 1.00 mg/dL   Glucose, Bld 128 (H) 70 - 99 mg/dL   Calcium, Ion 1.05 (L) 1.15 - 1.40 mmol/L   TCO2 29 22 - 32 mmol/L   Hemoglobin 10.9 (L) 12.0 - 15.0 g/dL   HCT 32.0 (L) 36.0 - 46.0 %  Glucose, capillary     Status: Abnormal   Collection Time: 08/30/19  9:52 AM  Result Value Ref Range   Glucose-Capillary 144 (H) 70 - 99 mg/dL  Glucose, capillary     Status: None   Collection Time: 08/30/19 12:22 PM  Result Value Ref Range   Glucose-Capillary 88 70 - 99 mg/dL    ROS:see hpi  Physical Exam: Vitals:   08/30/19 1420 08/30/19 1446  BP: (!) 113/37 (!) 100/47  Pulse: 66 69   Resp: 12 16  Temp:  97.7 F (36.5 C)  SpO2: 98% 92%     General: elderly AAF seen post op ,slightly Lethargic and appropriate post op , NAD  HEENT: [place] , shades  On, MMM Neck: no jvd  Heart: RRR 2/6 sem , no rub or gallop Lungs:  CTA  Unlabored breathing  Abdomen: BS obese , soft , NT, ND no ascites  Extremities: Pedal edema 1+ Right / L BKA dressing dry /clean  Skin: no overt rash  Neuro: normal post op- lethargy  But alert with questions moves extrem independently  Dialysis Access RUA AVF pos bruit   Dialysis Orders: Center: [facility]   on mwf  . EDW 92HD Bath 2k, 2.25ca  Time 4hr Heparin NONE. Access RUA AVF     hec 4 mcg IV/HD / Mircera 19mcg iv q 2wks (last on 08/29/19) Venofer 100mg   q wk /HD    Assessment/Plan 1. PAD/SP L BKA  Dr Oneida Alar 2/2 Gangrene Left foot - plan per Dr Oneida Alar  2. ESRD -  HD MWF  K 3.9   3. Hypertension/volume  - bp stable ,  4. Anemia  -hgb 9.7> 10.9  No esa needs currently  / Mircera given 08/29/19/ weekly venofer  Give next week in setting of Gangrene Infection  Issue  5. Metabolic bone disease -  Iv Vit d on hd and binders  6. Nutrition - renal /carb mod  , renal vit  7. DM type 2 - per admit 8. HO CAD/ LVEF 45-50 % mor MR/ TR, mild AS- stable currently   Ernest Haber, PA-C [place] [phone] 08/30/2019, 4:32 PM

## 2019-08-30 NOTE — Op Note (Signed)
Procedure: Left below-knee amputation  Preoperative diagnosis: Gangrene left foot  Postoperative diagnosis: Same  Anesthesia Gen.  Assistant: Ellsworth Lennox RN  Upper findings: #1 calcified vessels with reasonably well perfused muscle tissue  Operative details: After obtaining informed consent, the patient was taken to the operating room. The patient was placed in supine position on the operating table. After induction of general anesthesia and endotracheal intubation, the patient's entire left lower extremity was prepped and draped all the way down to the level of the ankle. An esmarch was used to exsanguinate the leg and a tourniquet inflated on the thigh to 300 mm Hg.  There was still significant bleeding even with the tourniquet up due to severely calcified vessels.  Next a transverse incision was made approximately 4 fingerbreadths below the tibial tuberosity on the left leg.  The  incision was carried posteriorly to the midportion of the leg and then extended longitudinally to create a posterior flap. The subcutaneous tissues and fascia was taken down with cautery. Periosteum was raised on the tibia approximately 5 cm above the skin edge.  The periosteum was also raised on the fibula several centimeters above this. The tibia was then divided with a saw. The fibula was divided with a bone cutter. The leg was then elevated in the operative field and the amputation was completed posterior to the bones with an amputation knife. Hemostasis was then obtained with cautery and several suture ligatures. The tibial and sural nerves were pulled down into the field transected and allowed to retract up into the leg.  After hemostasis was obtained, the wound was thoroughly irrigated with  normal saline solution. The fascial edges were then reapproximated with interrupted 2-0 Vicryl sutures. Subcutaneous tissues were reapproximated using running 3-0 Vicryl suture. The skin was closed with staples. The patient  tolerated the procedure well and there were no complications. Instrument sponge and  needle counts were correct at the end of the case. The patient was taken to the recovery room in stable condition.  Ruta Hinds, MD [facility] Office: [phone] Pager: [phone]

## 2019-08-31 LAB — GLUCOSE, CAPILLARY
Glucose-Capillary: 99 mg/dL (ref 70–99)
Glucose-Capillary: 97 mg/dL (ref 70–99)
Glucose-Capillary: 142 mg/dL — ABNORMAL HIGH (ref 70–99)
Glucose-Capillary: 111 mg/dL — ABNORMAL HIGH (ref 70–99)

## 2019-08-31 LAB — CBC
HCT: 24 % — ABNORMAL LOW (ref 36.0–46.0)
Hemoglobin: 7.7 g/dL — ABNORMAL LOW (ref 12.0–15.0)
MCH: 30.4 pg (ref 26.0–34.0)
MCHC: 32.1 g/dL (ref 30.0–36.0)
MCV: 94.9 fL (ref 80.0–100.0)
Platelets: 259 10*3/uL (ref 150–400)
RBC: 2.53 MIL/uL — ABNORMAL LOW (ref 3.87–5.11)
RDW: 18 % — ABNORMAL HIGH (ref 11.5–15.5)
WBC: 10.8 10*3/uL — ABNORMAL HIGH (ref 4.0–10.5)
nRBC: 0 % (ref 0.0–0.2)

## 2019-08-31 LAB — BASIC METABOLIC PANEL
Anion gap: 18 — ABNORMAL HIGH (ref 5–15)
BUN: 40 mg/dL — ABNORMAL HIGH (ref 8–23)
CO2: 26 mmol/L (ref 22–32)
Calcium: 8 mg/dL — ABNORMAL LOW (ref 8.9–10.3)
Chloride: 94 mmol/L — ABNORMAL LOW (ref 98–111)
Creatinine, Ser: 9.86 mg/dL — ABNORMAL HIGH (ref 0.44–1.00)
GFR calc Af Amer: 4 mL/min — ABNORMAL LOW (ref >60)
GFR calc non Af Amer: 4 mL/min — ABNORMAL LOW (ref >60)
Glucose, Bld: 85 mg/dL (ref 70–99)
Potassium: 4.3 mmol/L (ref 3.5–5.1)
Sodium: 138 mmol/L (ref 135–145)

## 2019-08-31 LAB — HEMOGLOBIN A1C
Hgb A1c MFr Bld: 7 % — ABNORMAL HIGH (ref 4.8–5.6)
Mean Plasma Glucose: 154 mg/dL

## 2019-08-31 MED ORDER — HYDROMORPHONE HCL 1 MG/ML IJ SOLN
INTRAMUSCULAR | Status: AC
  Administered 2019-08-31: 08:00:00 0.5 mg via INTRAVENOUS
  Filled 2019-08-31: qty 0.5

## 2019-08-31 MED ORDER — DARBEPOETIN ALFA 100 MCG/0.5ML IJ SOSY: 100.0000 ug | PREFILLED_SYRINGE | INTRAMUSCULAR | Status: DC

## 2019-08-31 MED ORDER — DOXERCALCIFEROL 4 MCG/2ML IV SOLN
INTRAVENOUS | Status: AC
  Filled 2019-08-31: qty 2

## 2019-08-31 MED ORDER — LISINOPRIL 20 MG PO TABS
20.0000 mg | ORAL_TABLET | Freq: Every day | ORAL | Status: DC
  Administered 2019-08-31 – 2019-09-02 (×2): 20 mg via ORAL
  Filled 2019-08-31 (×3): qty 1

## 2019-08-31 MED ORDER — HYDROMORPHONE HCL 1 MG/ML IJ SOLN: 0.5000 mg | Freq: Once | INTRAMUSCULAR | Status: AC

## 2019-08-31 NOTE — Evaluation (Signed)
Occupational Therapy Evaluation Patient Details Name: [name] MRN: [id number] DOB: 12-Aug-1950 Today's Date: 08/31/2019    History of Present Illness [name] is a 69 y.o. 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 Pt with hx of blindness due to DM. Pt;s sister present and provided PLOF and home set up info. Pt lives at home alone but her sister can be there as often as needed and brother can build a ramp. Pt was independent with ADLs/selfcare. Pt current;y requires max - total A + 2 for bed mobility with assist for balance/support, max A with UB ALDa, total A with LB ADLs, total A with toileting an unable to attempt transfers at this time. Pt would benefit from acute OT services to address impairments to maximize level of function and safety    Follow Up Recommendations  CIR    Equipment Recommendations  Other (comment);3 in 1 bedside commode;Tub/shower bench;Wheelchair (measurements OT)(TBD at next venue of care)    Recommendations for Other Services Rehab consult     Precautions / Restrictions Precautions Precautions: Fall Precaution Comments: L BKA Restrictions Weight Bearing Restrictions: Yes LLE Weight Bearing: Non weight bearing      Mobility Bed Mobility Overal bed mobility: Needs Assistance Bed Mobility: Supine to Sit;Sit to Supine     Supine to sit: Total assist;Max assist;+2 for physical assistance Sit to supine: Total assist;Max assist;+2 for physical assistance   General bed mobility comments: max A with LEs to EOB and back onto bed, total A with truk elevation and to return to supine. pt reports mild dizziness sitting EOB  Transfers                  General transfer comment: unable to attempt due to pain, fatigue    Balance Overall balance assessment: Needs assistance Sitting-balance support: Bilateral upper extremity supported;Feet supported Sitting balance-Leahy Scale: Fair Sitting balance - Comments: requiring mod A initially for balance/support, then progessing to min guard A. pt reports mild dizziness sitting EOB                                   ADL either performed or assessed with clinical judgement   ADL Overall ADL's : Needs assistance/impaired Eating/Feeding: Minimal assistance   Grooming: Wash/dry hands;Wash/dry face;Minimal assistance;Sitting   Upper Body Bathing: Maximal assistance;Sitting Upper Body Bathing Details (indicate cue type and reason): simulated Lower Body Bathing: Total assistance   Upper Body Dressing : Maximal assistance   Lower Body Dressing: Total assistance     Toilet Transfer Details (indicate cue type and reason): unable Toileting- Clothing Manipulation and Hygiene: Total assistance;Bed level         General ADL Comments: pt with Poor - Fair sitting balance, requiring mod A initially for balance/support, then progessing to min guard A     Vision Baseline Vision/History: Glaucoma;Legally blind(blindness from DM) Patient Visual Report: No change from baseline       Perception     Praxis      Pertinent Vitals/Pain Pain Assessment: Faces Faces Pain Scale: Hurts even more Pain Location: L residual limb with mobility Pain Descriptors /  Indicators: Grimacing;Guarding;Discomfort Pain Intervention(s): Limited activity within patient's tolerance;Monitored during session;Premedicated before session;Repositioned     Hand Dominance Right   Extremity/Trunk Assessment Upper Extremity Assessment Upper Extremity Assessment: Generalized weakness;LUE deficits/detail LUE Deficits / Details: old finger fracture in 2nd digit at IP joint (hyperextended)   Lower  Extremity Assessment Lower Extremity Assessment: Defer to PT evaluation       Communication Communication Communication: No difficulties   Cognition Arousal/Alertness: Lethargic;Suspect due to medications Behavior During Therapy: Flat affect Overall Cognitive Status: Impaired/Different from baseline Area of Impairment: Attention;Following commands                       Following Commands: Follows one step commands inconsistently       General Comments: pt with decreased attention and alertness, some incoherent speech at times while in supine, more alert and with clear speech once seated EOB   General Comments       Exercises     Shoulder Instructions      Home Living Family/patient expects to be discharged to:: Private residence Living Arrangements: Other relatives Available Help at Discharge: Family;Available PRN/intermittently Type of Home: House Home Access: Stairs to enter [employer] of Steps: 5 Entrance Stairs-Rails: Right;Left Home Layout: One level     Bathroom Shower/Tub: Teacher, early years/pre: Standard     Home Equipment: Environmental consultant - 2 wheels          Prior Functioning/Environment          Comments: pt's sister states that pt was independent with ADLs/selfcare and mobility        OT Problem List: Decreased strength;Impaired balance (sitting and/or standing);Pain;Decreased activity tolerance;Decreased knowledge of use of DME or AE;Impaired sensation      OT Treatment/Interventions: Self-care/ADL training;DME and/or AE instruction;Therapeutic activities;Balance training;Therapeutic exercise;Patient/family education    OT Goals(Current goals can be found in the care plan section) Acute Rehab OT Goals Patient Stated Goal: go home OT Goal Formulation: With patient/family Time For Goal Achievement: 09/14/19 Potential to Achieve Goals: Good ADL Goals Pt Will Perform Grooming: with min guard assist;with  supervision;with set-up;sitting Pt Will Perform Upper Body Bathing: with mod assist;with min assist;sitting Pt Will Perform Lower Body Bathing: with max assist;with mod assist;sitting/lateral leans Pt Will Perform Upper Body Dressing: with mod assist;with min assist;sitting Pt Will Transfer to Toilet: with max assist;with mod assist;with +2 assist;stand pivot transfer;with transfer board;anterior/posterior transfer(lateral scoot) Additional ADL Goal #1: Pt will complete bed mobility mod A to sit EOB for ADLs/selfcare tasks with F/G sitting balance  OT Frequency: Min 2X/week   Barriers to D/C:            Co-evaluation PT/OT/SLP Co-Evaluation/Treatment: Yes Reason for Co-Treatment: For patient/therapist safety;Complexity of the patient's impairments (multi-system involvement)   OT goals addressed during session: ADL's and self-care      AM-PAC OT "6 Clicks" Daily Activity     Outcome Measure Help from another person eating meals?: A Little Help from another person taking care of personal grooming?: A Little Help from another person toileting, which includes using toliet, bedpan, or urinal?: Total Help from another person bathing (including washing, rinsing, drying)?: Total Help from another person to put on and taking off regular upper body clothing?: A Lot Help from another person to put on and taking off regular lower body clothing?: Total 6 Click Score: 11   End of Session    Activity Tolerance: Patient limited by fatigue;Patient limited by pain Patient left: in  bed;with call bell/phone within reach;with family/visitor present  OT Visit Diagnosis: Other abnormalities of gait and mobility (R26.89);Muscle weakness (generalized) (M62.81);Pain;Low vision, both eyes (H54.2) Pain - Right/Left: Left Pain - part of body: Leg(residual limb)                Time: 6067-7034 OT Time Calculation (min): 32 min Charges:  OT General Charges $OT Visit: 1 Visit OT Evaluation $OT Eval  Moderate Complexity: 1 Mod    [name] 08/31/2019, 3:29 PM

## 2019-08-31 NOTE — Progress Notes (Addendum)
Subjective:  Seen on hd , tolerating hd uf , co some GI pain .  Objective Vital signs in last 24 hours: Vitals:   08/31/19 1000 08/31/19 1030 08/31/19 1100 08/31/19 1125  BP: 133/70 (!) 136/56 (!) 135/48 (!) 136/36  Pulse: 65 75 74 75  Resp:    18  Temp:    97.9 F (36.6 C)  TempSrc:    Oral  SpO2:    96%  Weight:    97 kg  Height:       Weight change:  Physical Exam:  General:  On hd . NAD ,elderly AAF   Heart: RRR 2/6 sem , no rub or gallop Lungs:  CTA  Unlabored breathing  Abdomen: BS obese , soft , NT, ND no ascites  Extremities: Pedal edema 1+ Right / L BKA dressing dry /clean  Dialysis Access RUA AVF patent on HD   Dialysis Orders: Center: [facility]   on mwf  . EDW 92HD Bath 2k, 2.25ca  Time 4hr Heparin NONE. Access RUA AVF     hec 4 mcg IV/HD / Mircera 7mcg iv q 2wks (last on 08/29/19) Venofer 100mg   q wk /HD   Problem/Plan: 1. PAD/SP L BKA  Dr Oneida Alar 2/2 Gangrene Left foot - plan per Dr Oneida Alar  2. ESRD -  HD MWF  K  4.3   3. Hypertension/volume  - bp stable ,has 3 home bp meds Amlodipine 5 mg q d, Lisinopril 40 mg hs, Carvedilol 25 mg bid , will decr home sec lowish bp with hd   4. Anemia  -Mircera given 08/29/19/ hgb 9.7> 10.9 >7.7 noted post op drop,  Will start ^ Aranesp  40 to 100   Due next week , check iron     5.    Metabolic bone disease -  Iv Vit d on hd and binders  6. Abd pain - wu per admit team , HO Mallory Weiss/ PUD on protonix  7. Nutrition - renal /carb mod  , renal vit  8. DM type 2 - per admit 9. HO CAD/ LVEF 45-50 % mor MR/ TR, mild AS- stable currently  Ernest Haber, PA-C [facility] Beeper [phone] 08/31/2019,12:29 PM  LOS: 1 day   Labs: Basic Metabolic Panel: Recent Labs  Lab 08/30/19 0603 08/30/19 0712 08/31/19 0403  NA 138 137 138  K 4.2 3.7 4.3  CL 92* 95* 94*  CO2 28  --  26  GLUCOSE 131* 128* 85  BUN 37* 34* 40*  CREATININE 8.78* 8.60* 9.86*  CALCIUM 8.6*  --  8.0*   Liver Function Tests: No results for  input(s): AST, ALT, ALKPHOS, BILITOT, PROT, ALBUMIN in the last 168 hours. No results for input(s): LIPASE, AMYLASE in the last 168 hours. No results for input(s): AMMONIA in the last 168 hours. CBC: Recent Labs  Lab 08/30/19 0603 08/30/19 0712 08/31/19 0403  WBC 9.6  --  10.8*  HGB 9.7* 10.9* 7.7*  HCT 29.0* 32.0* 24.0*  MCV 93.2  --  94.9  PLT 213  --  259   Cardiac Enzymes: No results for input(s): CKTOTAL, CKMB, CKMBINDEX, TROPONINI in the last 168 hours. CBG: Recent Labs  Lab 08/30/19 0952 08/30/19 1222 08/30/19 1638 08/30/19 2044 08/31/19 0619  GLUCAP 144* 88 133* 134* 99    Studies/Results: No results found. Medications: . magnesium sulfate bolus IVPB     . amLODipine  5 mg Oral Daily  . aspirin EC  81 mg Oral Daily  .  atropine  1 drop Right Eye BID  . carvedilol  25 mg Oral BID  . [START ON 09/01/2019] clopidogrel  75 mg Oral QHS  . docusate sodium  100 mg Oral BID  . doxercalciferol  4 mcg Intravenous Q M,W,F-HD  . DULoxetine  30 mg Oral Daily  . gabapentin  100 mg Oral BID  . heparin  5,000 Units Subcutaneous Q8H  . insulin aspart  0-15 Units Subcutaneous TID WC  . lanthanum  3,000 mg Oral TID WC  . lisinopril  40 mg Oral QHS  . multivitamin  1 tablet Oral QHS  . pantoprazole  40 mg Oral BID  . prednisoLONE acetate  1 drop Right Eye BID

## 2019-08-31 NOTE — Progress Notes (Signed)
Inpatient Rehabilitation Admissions Coordinator  Inpatient rehab consult received. I met with patient with her sister, [name] , at bedside. We discussed goals and expectations of an inpt rehab admit. [name] states pt wants to go directly home if possible. I further discussed the need for assessment with PT and OT to see if that is a realistic plan vs CIR admit . Brother is to build a ramp at home and patient does have 24/7 physical assist, but has outpatient dialysis 3 times per week. I will  follow up tomorrow.  Danne Baxter, RN, MSN Rehab Admissions Coordinator [phone] 08/31/2019 2:29 PM

## 2019-08-31 NOTE — Progress Notes (Addendum)
  Progress Note    08/31/2019 9:12 AM 1 Day Post-Op  Subjective: Patient seen in HD unit doing well following left below knee amputation. States no pain currently at surgical site   Vitals:   08/31/19 0830 08/31/19 0900  BP: (!) 145/77 (!) 154/50  Pulse: 77 74  Resp:    Temp:    SpO2:     Physical Exam: Lungs:  Non labored Incisions:  Left BKA stump dressings, clean, dry and intact Extremities:  Left femoral pulse palpable Abdomen:  Obese, nontender Neurologic: alert and oriented  CBC    Component Value Date/Time   WBC 10.8 (H) 08/31/2019 0403   RBC 2.53 (L) 08/31/2019 0403   HGB 7.7 (L) 08/31/2019 0403   HGB 6.5 (LL) 06/13/2013 1037   HCT 24.0 (L) 08/31/2019 0403   HCT 20.6 (L) 06/13/2013 1037   PLT 259 08/31/2019 0403   PLT 367 06/13/2013 1037   MCV 94.9 08/31/2019 0403   MCV 83.4 06/13/2013 1037   MCH 30.4 08/31/2019 0403   MCHC 32.1 08/31/2019 0403   RDW 18.0 (H) 08/31/2019 0403   RDW 16.0 (H) 06/13/2013 1037   LYMPHSABS 2.3 05/06/2018 1941   LYMPHSABS 1.6 06/13/2013 1037   MONOABS 0.8 05/06/2018 1941   MONOABS 0.5 06/13/2013 1037   EOSABS 0.2 05/06/2018 1941   EOSABS 0.3 06/13/2013 1037   BASOSABS 0.1 05/06/2018 1941   BASOSABS 0.0 06/13/2013 1037    BMET    Component Value Date/Time   NA 138 08/31/2019 0403   NA 141 06/13/2013 1037   K 4.3 08/31/2019 0403   K 4.4 06/13/2013 1037   CL 94 (L) 08/31/2019 0403   CL 110 (H) 10/01/2012 1052   CO2 26 08/31/2019 0403   CO2 20 (L) 06/13/2013 1037   GLUCOSE 85 08/31/2019 0403   GLUCOSE 124 06/13/2013 1037   GLUCOSE 101 (H) 10/01/2012 1052   BUN 40 (H) 08/31/2019 0403   BUN 93.4 (H) 06/13/2013 1037   CREATININE 9.86 (H) 08/31/2019 0403   CREATININE 9.3 (HH) 06/13/2013 1037   CALCIUM 8.0 (L) 08/31/2019 0403   CALCIUM 8.8 06/13/2013 1037   GFRNONAA 4 (L) 08/31/2019 0403   GFRAA 4 (L) 08/31/2019 0403    INR    Component Value Date/Time   INR 1.16 05/07/2018 0201     Intake/Output Summary  (Last 24 hours) at 08/31/2019 0911 Last data filed at 08/30/2019 1700 Gross per 24 hour  Intake 1540 ml  Output --  Net 1540 ml     Assessment/Plan:  69 y.o. female is s/p left below knee amputation 1 Day Post-Op. Doing well post op. She is requiring IV pain control. Dressing takedown and stump evaluation tomorrow.  DVT prophylaxis: SCD   Karoline Caldwell, [place] [facility] [phone] 08/31/2019 9:11 AM   Change dressing tomorrow PT/OT Maintain HD schedule  Ruta Hinds, MD [facility] [place] Office: [phone]

## 2019-08-31 NOTE — Progress Notes (Signed)
   08/31/19 1000  OT Visit Information  Last OT Received On 08/31/19  Reason Eval/Treat Not Completed Patient at procedure or test/ unavailable (HD)   Plan to reattempt at a later time/date.   Tyrone Schimke, OT Acute Rehabilitation Services Pager: [phone] Office: [phone]

## 2019-08-31 NOTE — Evaluation (Signed)
Physical Therapy Evaluation Patient Details Name: [name] MRN: [id number] DOB: 1951/04/08 Today's Date: 08/31/2019   History of Present Illness  [name] is a 69 y.o. 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 The pt presents with limitations in functional mobility, activity tolerance, and stability compared to her prior level of function and independence due to above dx as well as resultant pain and weakness. Per the pt's sister, she was independent with bed mobility, transfers, and ambulation PTA, but now requires maxA of 2 for bed mobility, and was unable to attempt standing today due to fatigue and lethargy. The pt will require further rehab prior to d/c home to reach her prior level of independence.      Follow Up Recommendations CIR;Supervision/Assistance - 24 hour    Equipment Recommendations  Other (comment)(defer to post-acute, pt has WC at home)    Recommendations for Other Services Rehab consult     Precautions / Restrictions Precautions Precautions: Fall Precaution Comments: L BKA Restrictions Weight Bearing Restrictions: Yes LLE Weight Bearing: Non weight bearing      Mobility  Bed Mobility Overal bed mobility: Needs Assistance Bed Mobility: Supine to Sit;Sit to Supine     Supine to sit: Total assist;Max assist;+2 for physical assistance Sit to supine: Total assist;Max assist;+2 for physical assistance   General bed mobility comments: max A with LEs to EOB and back onto bed, total A with truk elevation and to return to supine. pt reports mild dizziness sitting EOB  Transfers                 General transfer comment: unable to attempt due to pain,  fatigue  Ambulation/Gait             General Gait Details: unable  Stairs            Wheelchair Mobility    Modified Rankin (Stroke Patients Only)       Balance Overall balance assessment: Needs assistance Sitting-balance support: Bilateral upper extremity supported;Feet supported Sitting balance-Leahy Scale: Fair Sitting balance - Comments: requiring mod A initially for balance/support, then progessing to min guard A. pt reports mild dizziness sitting EOB Postural control: Posterior lean;Right lateral lean     Standing balance comment: unable today                             Pertinent Vitals/Pain Pain Assessment: Faces Faces Pain Scale: Hurts even more Pain Location: L residual limb with mobility Pain Descriptors / Indicators: Grimacing;Guarding;Discomfort Pain Intervention(s): Limited activity within patient's tolerance;Monitored during session;Repositioned    Home Living Family/patient expects to be discharged to:: Private residence Living Arrangements: Other relatives Available Help at Discharge: Family;Available PRN/intermittently Type of Home: House Home Access: Stairs to enter Entrance Stairs-Rails: Psychiatric nurse of Steps: 5 Home Layout: One level Home Equipment: Walker - 2 wheels Additional Comments: sister helps her get to HD    Prior Function           Comments: pt's sister states that pt was independent with ADLs/selfcare and mobility     Hand Dominance   Dominant Hand: Right    Extremity/Trunk Assessment  Upper Extremity Assessment Upper Extremity Assessment: Defer to OT evaluation LUE Deficits / Details: old finger fracture in 2nd digit at IP joint (hyperextended)    Lower Extremity Assessment Lower Extremity Assessment: Generalized weakness;LLE deficits/detail LLE Deficits / Details: new BKA, NWB LLE: Unable to fully assess due to pain;Unable to fully assess due to immobilization     Cervical / Trunk Assessment Cervical / Trunk Assessment: Kyphotic  Communication   Communication: No difficulties  Cognition Arousal/Alertness: Lethargic;Suspect due to medications Behavior During Therapy: Flat affect Overall Cognitive Status: Impaired/Different from baseline Area of Impairment: Attention;Following commands                       Following Commands: Follows one step commands inconsistently       General Comments: pt with decreased attention and alertness, some incoherent speech at times while in supine, more alert and with clear speech once seated EOB      General Comments      Exercises     Assessment/Plan    PT Assessment Patient needs continued PT services  PT Problem List Decreased strength;Decreased mobility;Decreased safety awareness;Decreased range of motion;Decreased coordination;Obesity;Decreased activity tolerance;Decreased cognition;Cardiopulmonary status limiting activity;Decreased skin integrity;Decreased balance;Decreased knowledge of use of DME;Pain       PT Treatment Interventions DME instruction;Therapeutic exercise;Gait training;Stair training;Balance training;Neuromuscular re-education;Functional mobility training;Therapeutic activities;Patient/family education    PT Goals (Current goals can be found in the Care Plan section)  Acute Rehab PT Goals Patient Stated Goal: go home PT Goal Formulation: With patient/family Time For Goal Achievement: 09/14/19 Potential to Achieve Goals: Fair    Frequency Min 3X/week   Barriers to discharge        Co-evaluation PT/OT/SLP Co-Evaluation/Treatment: Yes Reason for Co-Treatment: Complexity of the patient's impairments (multi-system involvement);Necessary to address cognition/behavior during functional activity;For patient/therapist safety PT goals addressed during session: Mobility/safety with mobility;Balance;Strengthening/ROM OT goals addressed during session: ADL's and self-care        AM-PAC PT "6 Clicks" Mobility  Outcome Measure Help needed turning from your back to your side while in a flat bed without using bedrails?: A Lot Help needed moving from lying on your back to sitting on the side of a flat bed without using bedrails?: A Lot Help needed moving to and from a bed to a chair (including a wheelchair)?: Total Help needed standing up from a chair using your arms (e.g., wheelchair or bedside chair)?: Total Help needed to walk in hospital room?: Total Help needed climbing 3-5 steps with a railing? : Total 6 Click Score: 8    End of Session Equipment Utilized During Treatment: Oxygen Activity Tolerance: Patient tolerated treatment well;Patient limited by fatigue Patient left: in bed;with family/visitor present;with call bell/phone within reach;with bed alarm set Nurse Communication: Mobility status PT Visit Diagnosis: Other abnormalities of gait and mobility (R26.89);Difficulty in walking, not elsewhere classified (R26.2);Muscle weakness (generalized) (M62.81);Pain Pain - Right/Left: Left Pain - part of body: Leg    Time: 1431-1501 PT Time Calculation (min) (ACUTE ONLY): 30 min   Charges:   PT Evaluation $PT Eval Moderate Complexity: 1 Mod          Karma Ganja, PT, DPT   Acute Rehabilitation Department Pager #: [phone]  Otho Bellows 08/31/2019, 5:06 PM

## 2019-09-01 LAB — BASIC METABOLIC PANEL WITH GFR
Anion gap: 15 (ref 5–15)
BUN: 22 mg/dL (ref 8–23)
CO2: 28 mmol/L (ref 22–32)
Calcium: 8.4 mg/dL — ABNORMAL LOW (ref 8.9–10.3)
Chloride: 92 mmol/L — ABNORMAL LOW (ref 98–111)
Creatinine, Ser: 7.06 mg/dL — ABNORMAL HIGH (ref 0.44–1.00)
GFR calc Af Amer: 6 mL/min — ABNORMAL LOW
GFR calc non Af Amer: 6 mL/min — ABNORMAL LOW
Glucose, Bld: 128 mg/dL — ABNORMAL HIGH (ref 70–99)
Potassium: 4.6 mmol/L (ref 3.5–5.1)
Sodium: 135 mmol/L (ref 135–145)

## 2019-09-01 LAB — IRON AND TIBC
Iron: 38 ug/dL (ref 28–170)
Saturation Ratios: 18 % (ref 10.4–31.8)
TIBC: 211 ug/dL — ABNORMAL LOW (ref 250–450)
UIBC: 173 ug/dL

## 2019-09-01 LAB — CBC
HCT: 23.6 % — ABNORMAL LOW (ref 36.0–46.0)
Hemoglobin: 7.6 g/dL — ABNORMAL LOW (ref 12.0–15.0)
MCH: 30.6 pg (ref 26.0–34.0)
MCHC: 32.2 g/dL (ref 30.0–36.0)
MCV: 95.2 fL (ref 80.0–100.0)
Platelets: 276 10*3/uL (ref 150–400)
RBC: 2.48 MIL/uL — ABNORMAL LOW (ref 3.87–5.11)
RDW: 17.6 % — ABNORMAL HIGH (ref 11.5–15.5)
WBC: 9.8 10*3/uL (ref 4.0–10.5)
nRBC: 0 % (ref 0.0–0.2)

## 2019-09-01 LAB — GLUCOSE, CAPILLARY
Glucose-Capillary: 138 mg/dL — ABNORMAL HIGH (ref 70–99)
Glucose-Capillary: 141 mg/dL — ABNORMAL HIGH (ref 70–99)
Glucose-Capillary: 199 mg/dL — ABNORMAL HIGH (ref 70–99)
Glucose-Capillary: 173 mg/dL — ABNORMAL HIGH (ref 70–99)

## 2019-09-01 LAB — SURGICAL PATHOLOGY

## 2019-09-01 MED ORDER — MORPHINE SULFATE (PF) 2 MG/ML IV SOLN: 2.0000 mg | INTRAVENOUS | Status: DC | PRN

## 2019-09-01 MED ORDER — TRAMADOL HCL 50 MG PO TABS
50.0000 mg | ORAL_TABLET | Freq: Four times a day (QID) | ORAL | Status: DC | PRN
  Filled 2019-09-01 (×2): qty 1

## 2019-09-01 NOTE — Progress Notes (Signed)
Physical Therapy Treatment Patient Details Name: [name] MRN: [id number] DOB: [birth year] Today's Date: 09/01/2019    History of Present Illness [name] is a 69 y.o. 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 Rn reports that pt had a rough morning with a lot of confusion and hallucinations. She has been very fatigued since. Pt was agreeable to perform HEP in supine and attempt to progress to EOB. Once EOB pt was able to sit for ~ 10 min with intermittent assist. Unable to progress OOB to recliner chair today secondary to pt fatigue and weakness. Will continue to follow acutely.    Follow Up Recommendations  CIR;Supervision/Assistance - 24 hour     Equipment Recommendations  Other (comment)(defer to post-acute, pt has WC at home)    Recommendations for Other Services       Precautions / Restrictions Precautions Precautions: Fall;Other (comment) Precaution Comments: L BKA, Pt blind Restrictions Weight Bearing Restrictions: Yes LLE Weight Bearing: Non weight bearing    Mobility  Bed Mobility Overal bed mobility: Needs Assistance Bed Mobility: Supine to Sit;Sit to Supine     Supine to sit: +2 for physical assistance;Max assist Sit to supine: Max assist;+2 for physical assistance   General bed mobility comments: Pt assisting only slightly with mobility to and from EOB.   Transfers                 General transfer comment: unable to attempt due to pain, fatigue  Ambulation/Gait             General Gait Details: unable   Stairs             Wheelchair Mobility    Modified Rankin (Stroke Patients Only)       Balance Overall balance assessment: Needs assistance Sitting-balance support: Bilateral upper  extremity supported;Feet supported Sitting balance-Leahy Scale: Fair Sitting balance - Comments: requiring mod A initially for balance/support, then progessing to min guard A. pt reports mild dizziness sitting EOB. Unable to maintain balance when attempting LAQ on LLE while sitting EOB Postural control: Posterior lean;Right lateral lean     Standing balance comment: unable today                            Cognition Arousal/Alertness: Lethargic;Suspect due to medications Behavior During Therapy: Flat affect Overall Cognitive Status: Impaired/Different from baseline Area of Impairment: Attention;Following commands;Memory                   Current Attention Level: Sustained Memory: Decreased short-term memory;Decreased recall of precautions Following Commands: Follows one step commands inconsistently       General Comments: Pt reported she had been walking on her leg, and needed to be reminded that she wasn't walking any at this time. Very lethargic, Rn reports she was confused and hallucinating this AM. Hgb at 7.6, likely contributing to pt fatigue.      Exercises Amputee Exercises Quad Sets: AROM;Left;10 reps;Supine Gluteal Sets: AROM;Left;10 reps;Supine Towel Squeeze: AROM;Left;10 reps;Supine Hip ABduction/ADduction: AROM;Left;10 reps;Supine Knee Extension: AROM;Left;10 reps;Supine    General Comments        Pertinent Vitals/Pain Pain Assessment: Faces Faces Pain Scale: Hurts even more Pain  Location: L residual limb with mobility Pain Descriptors / Indicators: Grimacing;Guarding;Discomfort Pain Intervention(s): Monitored during session;Limited activity within patient's tolerance    Home Living                      Prior Function            PT Goals (current goals can now be found in the care plan section) Acute Rehab PT Goals Patient Stated Goal: go home PT Goal Formulation: With patient/family Time For Goal Achievement:  09/14/19 Potential to Achieve Goals: Fair Progress towards PT goals: Progressing toward goals    Frequency    Min 3X/week      PT Plan Current plan remains appropriate    Co-evaluation              AM-PAC PT "6 Clicks" Mobility   Outcome Measure  Help needed turning from your back to your side while in a flat bed without using bedrails?: A Lot Help needed moving from lying on your back to sitting on the side of a flat bed without using bedrails?: A Lot Help needed moving to and from a bed to a chair (including a wheelchair)?: Total Help needed standing up from a chair using your arms (e.g., wheelchair or bedside chair)?: Total Help needed to walk in hospital room?: Total Help needed climbing 3-5 steps with a railing? : Total 6 Click Score: 8    End of Session Equipment Utilized During Treatment: Oxygen Activity Tolerance: Patient tolerated treatment well;Patient limited by fatigue Patient left: in bed;with family/visitor present;with call bell/phone within reach;with bed alarm set Nurse Communication: Mobility status PT Visit Diagnosis: Other abnormalities of gait and mobility (R26.89);Difficulty in walking, not elsewhere classified (R26.2);Muscle weakness (generalized) (M62.81);Pain Pain - Right/Left: Left Pain - part of body: Leg     Time: 1405-1440 PT Time Calculation (min) (ACUTE ONLY): 35 min  Charges:  $Therapeutic Exercise: 8-22 mins $Therapeutic Activity: 8-22 mins                    Benjiman Core, [place] Pager [phone] Acute Rehab   Allena Katz 09/01/2019, 2:57 PM

## 2019-09-01 NOTE — Progress Notes (Signed)
Subjective:  This am no cos and agitated will not let me exam her/ HD yest  On schedule   Objective Vital signs in last 24 hours: Vitals:   08/31/19 2212 09/01/19 0413 09/01/19 0453 09/01/19 0844  BP: (!) 116/37 (!) 120/44 (!) 129/39 (!) 117/46  Pulse: 75 74 82 83  Resp: 19 20 20 17   Temp: 98.6 F (37 C) 99 F (37.2 C) 97.9 F (36.6 C) 99.1 F (37.3 C)  TempSrc: Oral Oral  Oral  SpO2: 100% 98% 100% 94%  Weight:      Height:       Weight change:   Physical Exam: General:  Alert OX3 but agitated will not let me exam.   OP Dialysis Orders: Center:[facility]. EDW92HD Bath2k, 2.25caTime 4hrHeparin NONE. AccessRUA AVF  hec 13mcg IV/HD / Mircera 45mcg iv q 2wks (last on 08/29/19) Venofer 100mg  q wk /HD   Problem/Plan: 1. PAD/SP L BKA Dr Oneida Alar 2/2 Gangrene Left foot - plan per Dr Oneida Alar noted CIR seeing   2. ESRD -HD MWF K  4.6 3. Hypertension/volume - bp stable ,has 3 home bp meds Amlodipine 5 mg q d, Lisinopril 40 mg hs, Carvedilol 25 mg bid , will decr home sec lowish bp with hd  4. Anemia -Mircera given 08/29/19/ hgb 9.7>10.9 >7.7>7.6  noted post op drop,  Will start ^ Aranesp  40 to 100   Due next week , check iron    5.   Metabolic bone disease -Iv Vit d on hd and binders 6. Abd pain - wu per admit team , HO Mallory Weiss/ PUD on protonix  7. Nutrition -renal /carb mod , renal vit  8. DM type 2 - per admit 9. HO CAD/ LVEF 45-50 % mor MR/ TR, mild AS- stable currently  Ernest Haber, PA-C [facility] [phone] 09/01/2019,10:23 AM  LOS: 2 days   Labs: Basic Metabolic Panel: Recent Labs  Lab 08/30/19 0603 08/30/19 0603 08/30/19 0712 08/31/19 0403 09/01/19 0220  NA 138   < > 137 138 135  K 4.2   < > 3.7 4.3 4.6  CL 92*   < > 95* 94* 92*  CO2 28  --   --  26 28  GLUCOSE 131*   < > 128* 85 128*  BUN 37*   < > 34* 40* 22  CREATININE 8.78*   < > 8.60* 9.86* 7.06*  CALCIUM 8.6*  --   --  8.0* 8.4*   < > =  values in this interval not displayed.   Liver Function Tests: No results for input(s): AST, ALT, ALKPHOS, BILITOT, PROT, ALBUMIN in the last 168 hours. No results for input(s): LIPASE, AMYLASE in the last 168 hours. No results for input(s): AMMONIA in the last 168 hours. CBC: Recent Labs  Lab 08/30/19 0603 08/30/19 0603 08/30/19 0712 08/31/19 0403 09/01/19 0220  WBC 9.6  --   --  10.8* 9.8  HGB 9.7*   < > 10.9* 7.7* 7.6*  HCT 29.0*   < > 32.0* 24.0* 23.6*  MCV 93.2  --   --  94.9 95.2  PLT 213  --   --  259 276   < > = values in this interval not displayed.   Cardiac Enzymes: No results for input(s): CKTOTAL, CKMB, CKMBINDEX, TROPONINI in the last 168 hours. CBG: Recent Labs  Lab 08/31/19 0619 08/31/19 1224 08/31/19 1623 08/31/19 2232 09/01/19 0842  GLUCAP 99 97 142* 111* 138*    Studies/Results:  No results found. Medications: . magnesium sulfate bolus IVPB     . aspirin EC  81 mg Oral Daily  . atropine  1 drop Right Eye BID  . carvedilol  25 mg Oral BID  . clopidogrel  75 mg Oral QHS  . [START ON 09/14/2019] darbepoetin (ARANESP) injection - DIALYSIS  100 mcg Intravenous Q Wed-HD  . docusate sodium  100 mg Oral BID  . doxercalciferol  4 mcg Intravenous Q M,W,F-HD  . DULoxetine  30 mg Oral Daily  . gabapentin  100 mg Oral BID  . heparin  5,000 Units Subcutaneous Q8H  . insulin aspart  0-15 Units Subcutaneous TID WC  . lanthanum  3,000 mg Oral TID WC  . lisinopril  20 mg Oral QHS  . multivitamin  1 tablet Oral QHS  . pantoprazole  40 mg Oral BID  . prednisoLONE acetate  1 drop Right Eye BID

## 2019-09-01 NOTE — Progress Notes (Signed)
Pt is agitated with RN stating "I want to go home, I don't trust you." Pt currently refusing to take medications. RN tried to reassure pt that our goal is to get her home but that she needed to work with therapy a little more to get her safe to go home. RN offered to call pt's sister and daughter for her but she just kept saying "I don't trust you, leave me alone." RN tried to reorient pt but was unsuccessful. Pt refusing to keep nasal cannula in. RN spoke with daughter this morning and made her aware of pt's confusion, she stated this is not pt's baseline.

## 2019-09-01 NOTE — Progress Notes (Signed)
Inpatient Rehabilitation Admissions Coordinator  I met with pt's sister, [name] and RN outside of room for patient is upset and talking with her daughter by phone requesting to go home. Patient does not want CIR admit. Prefers home with [facility] and [name] feels that once her pain is managed and home health set up, that she can plan d/c home after hemodialysis in the next 1 to 2 days. Family have been lifting her into wheelchair pta due to her LE issues. I have notified RN CM , Wendi, and will sign off. [name] has my number to contact me in case patient/Family change their mind.  Danne Baxter, RN, MSN Rehab Admissions Coordinator [phone] 09/01/2019 11:08 AM

## 2019-09-01 NOTE — Progress Notes (Addendum)
  Progress Note    09/01/2019 8:13 AM 2 Days Post-Op  Subjective:  Pain L BKA stump   Vitals:   09/01/19 0413 09/01/19 0453  BP: (!) 120/44 (!) 129/39  Pulse: 74 82  Resp: 20 20  Temp: 99 F (37.2 C) 97.9 F (36.6 C)  SpO2: 98% 100%    Physical Exam: Incisions:  BKA incision c/d/i; skin edges viable   CBC    Component Value Date/Time   WBC 9.8 09/01/2019 0220   RBC 2.48 (L) 09/01/2019 0220   HGB 7.6 (L) 09/01/2019 0220   HGB 6.5 (LL) 06/13/2013 1037   HCT 23.6 (L) 09/01/2019 0220   HCT 20.6 (L) 06/13/2013 1037   PLT 276 09/01/2019 0220   PLT 367 06/13/2013 1037   MCV 95.2 09/01/2019 0220   MCV 83.4 06/13/2013 1037   MCH 30.6 09/01/2019 0220   MCHC 32.2 09/01/2019 0220   RDW 17.6 (H) 09/01/2019 0220   RDW 16.0 (H) 06/13/2013 1037   LYMPHSABS 2.3 05/06/2018 1941   LYMPHSABS 1.6 06/13/2013 1037   MONOABS 0.8 05/06/2018 1941   MONOABS 0.5 06/13/2013 1037   EOSABS 0.2 05/06/2018 1941   EOSABS 0.3 06/13/2013 1037   BASOSABS 0.1 05/06/2018 1941   BASOSABS 0.0 06/13/2013 1037    BMET    Component Value Date/Time   NA 135 09/01/2019 0220   NA 141 06/13/2013 1037   K 4.6 09/01/2019 0220   K 4.4 06/13/2013 1037   CL 92 (L) 09/01/2019 0220   CL 110 (H) 10/01/2012 1052   CO2 28 09/01/2019 0220   CO2 20 (L) 06/13/2013 1037   GLUCOSE 128 (H) 09/01/2019 0220   GLUCOSE 124 06/13/2013 1037   GLUCOSE 101 (H) 10/01/2012 1052   BUN 22 09/01/2019 0220   BUN 93.4 (H) 06/13/2013 1037   CREATININE 7.06 (H) 09/01/2019 0220   CREATININE 9.3 (HH) 06/13/2013 1037   CALCIUM 8.4 (L) 09/01/2019 0220   CALCIUM 8.8 06/13/2013 1037   GFRNONAA 6 (L) 09/01/2019 0220   GFRAA 6 (L) 09/01/2019 0220    INR    Component Value Date/Time   INR 1.16 05/07/2018 0201     Intake/Output Summary (Last 24 hours) at 09/01/2019 0813 Last data filed at 08/31/2019 1125 Gross per 24 hour  Intake --  Output 1500 ml  Net -1500 ml     Assessment/Plan:  69 y.o. female is s/p left below  knee amputation  2 Days Post-Op  - Dressing changed; continue xeroform, 4x4, kerlex, ACE - ESRD on HD per Nephrology - CIR to re-eval today for placement   Dagoberto Ligas, PA-C [facility] [phone] 09/01/2019 8:13 AM   I have seen and evaluated the patient. I agree with the PA note as documented above. POD#2 s/p left BKA. Incision looks good.  Patient requesting to go home instead of CIR.  Will await CIR re-evaluation.  Seems agitated and somewhat disoriented today.  Appreciate nephrology following her for ESRD.  Marty Heck, MD [facility] [place] Office: [phone]

## 2019-09-01 NOTE — Progress Notes (Signed)
    Durable Medical Equipment  (From admission, onward)         Start     Ordered   09/01/19 1531  For home use only DME Hospital bed  Once    Question Answer Comment  Length of Need Lifetime   Patient has (list medical condition): blind, left BKA, ESRD   The above medical condition requires: Patient requires the ability to reposition frequently   Head must be elevated greater than: 30 degrees   Bed type Semi-electric   Support Surface: Gel Overlay      09/01/19 1531   09/01/19 1528  For home use only DME lightweight manual wheelchair with seat cushion  Once    Comments: Patient suffers from new L BKA which impairs their ability to perform daily activities like toileting and grooming in the home.  A rolling walker will not resolve  issue with performing activities of daily living. A wheelchair will allow patient to safely perform daily activities. Patient is not able to propel themselves in the home using a standard weight wheelchair due to blind and weakness. Patient can self propel in the lightweight wheelchair. Length of need lifetime. Accessories: elevating leg rests (ELRs), wheel locks, extensions and anti-tippers.   09/01/19 [place], RN MSN CCM Transitions of Care [phone]

## 2019-09-01 NOTE — TOC Initial Note (Addendum)
Transition of Care [facility]) - Initial/Assessment Note    Patient Details  Name: [name] MRN: [id number] Date of Birth: 07/13/1951  Transition of Care [facility]) CM/SW Contact:    Bartholomew Crews, RN Phone Number: [phone] 09/01/2019, 3:38 PM  Clinical Narrative:                 Spoke with sister and patient at the bedside. PTA patient is home with 24/7 caregiver assistance provided by rotating family members.   Sister provides transportation to and from hemodialysis. Patient attends HD at [facility] on MWF. Son will assist with transportation and getting patient in/out of private vehicle.   DME already in the home include RW and 3-N-1. Sister requesting hospital bed and wheelchair. DME orders and narrative created and pending cosign by MD. [employer] notified of DME needs, and will deliver to home. Sister aware that [employer] will contact for delivery.   Discussed HH recommendations for PT and OT. Sister in agreement and stated that they have used [facility] in the past. Referral accepted by [facility] for PT and OT. HH orders already entered.  Discussed need for aide services, but sister stated that she assists with adls. Sister aware of [facility] accepting [facility] referral.   Patient will need PTAR transport at time of discharge.   TOC following for transition needs.   Expected Discharge Plan: [place] Barriers to Discharge: Continued Medical Work up   Patient Goals and CMS Choice Patient states their goals for this hospitalization and ongoing recovery are:: return home with family assist CMS Medicare.gov Compare Post Acute Care list provided to:: Patient Represenative (must comment) Choice offered to / list presented to : Patient, Sibling  Expected Discharge Plan and Services Expected Discharge Plan: [place] In-house Referral: Clinical Social Work Discharge Planning Services: CM Consult Post Acute Care Choice: Home Health, Durable Medical  Equipment Living arrangements for the past 2 months: [place]                 DME Arranged: Hospital bed, Wheelchair manual DME Agency: [employer] Date DME Agency Contacted: 09/01/19 Time DME Agency Contacted: [phone] Representative spoke with at DME Agency: Thedore Mins HH Arranged: OT, [place] Agency: [facility] Date [place]: 09/01/19 Time HH Agency Contacted: 57 Representative spoke with at [place]: [place]  Prior Living Arrangements/Services Living arrangements for the past 2 months: [place] with:: Self, Adult Children, Siblings Patient language and need for interpreter reviewed:: Yes Do you feel safe going back to the place where you live?: Yes      Need for Family Participation in Patient Care: Yes (Comment) Care giver support system in place?: Yes (comment) Current home services: DME Criminal Activity/Legal Involvement Pertinent to Current Situation/Hospitalization: No - Comment as needed  Activities of Daily Living Home Assistive Devices/Equipment: Grab bars around toilet, Grab bars in shower, Hand-held shower hose, CBG Meter, Nebulizer ADL Screening (condition at time of admission) Patient's cognitive ability adequate to safely complete daily activities?: Yes Is the patient deaf or have difficulty hearing?: No Does the patient have difficulty seeing, even when wearing glasses/contacts?: Yes Does the patient have difficulty concentrating, remembering, or making decisions?: No Patient able to express need for assistance with ADLs?: Yes Does the patient have difficulty dressing or bathing?: Yes Independently performs ADLs?: No Communication: Independent Dressing (OT): Needs assistance Is this a change from baseline?: Pre-admission baseline Grooming: Needs assistance Is this a change  from baseline?: Pre-admission baseline Feeding: Independent Bathing: Needs assistance Is this a change from baseline?: Pre-admission baseline In/Out  Bed: Needs assistance Is this a change from baseline?: Pre-admission baseline Walks in Home: Independent with device (comment), Needs assistance Is this a change from baseline?: Pre-admission baseline Does the patient have difficulty walking or climbing stairs?: Yes Weakness of Legs: Left Weakness of Arms/Hands: None  Permission Sought/Granted Permission sought to share information with : Family Supports Permission granted to share information with : Yes, Verbal Permission Granted  Share Information with NAME: [name]     Permission granted to share info w Relationship: sister  Permission granted to share info w Contact Information: [phone]  Emotional Assessment Appearance:: Appears stated age Attitude/Demeanor/Rapport: Other (comment)(sleepy) Affect (typically observed): Accepting Orientation: : Oriented to Self, Oriented to Place, Oriented to  Time, Oriented to Situation Alcohol / Substance Use: Not Applicable Psych Involvement: No (comment)  Admission diagnosis:  Gangrene of foot [facility]) [I96] Patient Active Problem List   Diagnosis Date Noted  . Gangrene of foot ([place]) 08/30/2019  . Other fatigue 02/28/2019  . Finger pain, left 02/03/2019  . Trigger ring finger of left hand 02/03/2019  . Osteomyelitis, unspecified ([place]) 11/16/2018  . Diabetic retinopathy associated with type 2 diabetes mellitus ([place]) 11/15/2018  . Rheumatoid factor positive 11/15/2018  . Anemia in ESRD (end-stage renal disease) ([place]) 05/06/2018  . Asthma 05/06/2018  . Hx of adenomatous colonic polyps 04/07/2018  . Status post carpal tunnel release 12/10/2017  . Carpal tunnel syndrome, left upper limb 11/10/2017  . Carpal tunnel syndrome, right upper limb 11/10/2017  . Bilateral hand numbness 09/28/2017  . Dependence on renal dialysis ([place]) 09/11/2017  . Abdominal discomfort, epigastric   . Hypertension 07/06/2017  . Chronic combined systolic and diastolic CHF (congestive heart failure) ([place])  07/06/2017  . Complicated migraine 44/81/8563  . ESRD on dialysis ([place]) 06/04/2017  . Blindness of both eyes 06/04/2017  . Glaucoma 06/04/2017  . Essential hypertension 10/27/2016  . GERD (gastroesophageal reflux disease) 10/27/2016  . Hypercalcemia 05/28/2015  . Abnormal stress test 03/15/2015  . Poor venous access 02/08/2015  . Breast cancer ([place]) 11/02/2014  . Diarrhea, unspecified 11/02/2014  . Fever, unspecified 11/02/2014  . Pain, unspecified 11/02/2014  . Pruritus, unspecified 11/02/2014  . Shortness of breath 07/28/2014  . Acquired absence of eye 03/31/2014  . Coagulation defect, unspecified ([place]) 03/22/2014  . Dysuria 02/14/2014  . Encounter for immunization 01/03/2014  . Iron deficiency anemia, unspecified 10/18/2013  . Unspecified protein-calorie malnutrition ([place]) 09/23/2013  . Complication of vascular dialysis catheter 06/21/2013  . Fibromyalgia 06/21/2013  . Personal history of breast cancer 06/21/2013  . Personal history of other diseases of the musculoskeletal system and connective tissue 06/21/2013  . Type 2 diabetes mellitus with diabetic polyneuropathy ([place]) 06/21/2013  . Secondary hyperparathyroidism of renal origin ([place]) 06/20/2013  . Unspecified complication of cardiac and vascular prosthetic device, implant and graft, subsequent encounter 06/20/2013  . Type II diabetes mellitus with renal manifestations ([place]) 06/15/2013  . Chronic kidney disease (CKD), stage IV (severe) ([place]) 06/03/2013  . Neuropathy 12/31/2011  . Breast cancer of upper-inner quadrant of left female breast ([place]) 06/27/2011   PCP:  Prince Solian, MD Pharmacy:   [facility] [place], [place] - [address] [place] [address] Phone: [phone] Fax: [phone]     Social Determinants of Health (SDOH) Interventions    Readmission Risk Interventions No flowsheet data found.

## 2019-09-02 LAB — RENAL FUNCTION PANEL
Albumin: 2.4 g/dL — ABNORMAL LOW (ref 3.5–5.0)
Anion gap: 15 (ref 5–15)
BUN: 42 mg/dL — ABNORMAL HIGH (ref 8–23)
CO2: 24 mmol/L (ref 22–32)
Calcium: 8.5 mg/dL — ABNORMAL LOW (ref 8.9–10.3)
Chloride: 93 mmol/L — ABNORMAL LOW (ref 98–111)
Creatinine, Ser: 9.86 mg/dL — ABNORMAL HIGH (ref 0.44–1.00)
GFR calc Af Amer: 4 mL/min — ABNORMAL LOW (ref >60)
GFR calc non Af Amer: 4 mL/min — ABNORMAL LOW (ref >60)
Glucose, Bld: 151 mg/dL — ABNORMAL HIGH (ref 70–99)
Phosphorus: 4.9 mg/dL — ABNORMAL HIGH (ref 2.5–4.6)
Potassium: 4.7 mmol/L (ref 3.5–5.1)
Sodium: 132 mmol/L — ABNORMAL LOW (ref 135–145)

## 2019-09-02 LAB — CBC
HCT: 20.9 % — ABNORMAL LOW (ref 36.0–46.0)
Hemoglobin: 6.9 g/dL — CL (ref 12.0–15.0)
MCH: 30.7 pg (ref 26.0–34.0)
MCHC: 33 g/dL (ref 30.0–36.0)
MCV: 92.9 fL (ref 80.0–100.0)
Platelets: 248 10*3/uL (ref 150–400)
RBC: 2.25 MIL/uL — ABNORMAL LOW (ref 3.87–5.11)
RDW: 17.5 % — ABNORMAL HIGH (ref 11.5–15.5)
WBC: 9.7 10*3/uL (ref 4.0–10.5)
nRBC: 0.2 % (ref 0.0–0.2)

## 2019-09-02 LAB — PREPARE RBC (CROSSMATCH)

## 2019-09-02 LAB — GLUCOSE, CAPILLARY
Glucose-Capillary: 161 mg/dL — ABNORMAL HIGH (ref 70–99)
Glucose-Capillary: 88 mg/dL (ref 70–99)
Glucose-Capillary: 166 mg/dL — ABNORMAL HIGH (ref 70–99)

## 2019-09-02 MED ORDER — TRAMADOL HCL 50 MG PO TABS: 50.0000 mg | ORAL_TABLET | Freq: Four times a day (QID) | ORAL | 0 refills | Status: DC | PRN

## 2019-09-02 MED ORDER — DOXERCALCIFEROL 4 MCG/2ML IV SOLN
INTRAVENOUS | Status: AC
  Filled 2019-09-02: qty 2

## 2019-09-02 MED ORDER — CHLORHEXIDINE GLUCONATE CLOTH 2 % EX PADS
6.0000 | MEDICATED_PAD | Freq: Every day | CUTANEOUS | Status: DC
  Administered 2019-09-02: 09:00:00 6 via TOPICAL

## 2019-09-02 MED ORDER — SODIUM CHLORIDE 0.9% IV SOLUTION: Freq: Once | INTRAVENOUS | Status: DC

## 2019-09-02 NOTE — Discharge Summary (Signed)
Discharge Summary  Patient ID: [name] [id number] 68 y.o. Jan 03, 1951  Admit date: 08/30/2019  Discharge date and time: 09/02/19   Admitting Physician: [name], MD   Discharge Physician: Dr. Carlis Abbott  Admission Diagnoses: Gangrene of foot [facility]) [I96]  Discharge Diagnoses: S/p L BKA  Admission Condition: fair  Discharged Condition: fair  Indication for Admission: gangrenous L foot  Hospital Course: [name] is a 69 year old female who was brought in as an outpatient for left below the knee amputation due to gangrenous left foot.  This was performed by Dr. Oneida Alar on 08/30/2019.  Nephrology was consulted at the time of admission to manage end-stage renal disease on hemodialysis while in-house.  Much of hospital stay consisted of pain control and increasing mobility with therapy teams.  Patient and family declined rehab or SNF placement and wanted home health.  The transition of care team was consulted to arrange home health PT and OT.  DME requirements include wheelchair as well as hospital bed.  She is scheduled for hemodialysis treatment this morning.  As long as home health and DME are arranged she may be discharged home after HD treatment.  She will follow-up in office in about 4 to 6 weeks for staple removal.  She will be prescribed 3 to 4 days of narcotic pain medication for continued postoperative pain control.  Discharge instructions were reviewed with the patient and she voiced her understanding.  She will be discharged home if stable following HD treatment this morning.  Consults: nephrology  Treatments: surgery: Left below the knee amputation by Dr. Oneida Alar on 08/30/2019  Discharge Exam: See progress note 09/02/2019 Vitals:   09/02/19 0740 09/02/19 0806  BP:  (!) 117/47  Pulse:  72  Resp:  17  Temp: 98.2 F (36.8 C) 99.8 F (37.7 C)  SpO2:  100%     Disposition: Discharge disposition: 01-Home or Self Care       Patient Instructions:    Allergies as of 09/02/2019      Reactions   Latex Hives   Oxycodone Other (See Comments)   Hallucinations    Tramadol Other (See Comments)   hallucinations    Tape Rash   52M Transpore adhesive tape. Medical tape   Vicodin [hydrocodone-acetaminophen] Other (See Comments)   hallucinations      Medication List    TAKE these medications   acetaminophen 500 MG tablet Commonly known as: TYLENOL Take 500 mg by mouth 2 (two) times daily.   albuterol (2.5 MG/52ML) 0.083% nebulizer solution Commonly known as: PROVENTIL Take 3 mLs (2.5 mg total) by nebulization every 6 (six) hours as needed for wheezing or shortness of breath.   amLODipine 5 MG tablet Commonly known as: NORVASC Take 5 mg by mouth daily.   aspirin EC 81 MG tablet Take 81 mg by mouth daily.   atropine 1 % ophthalmic solution Place 1 drop into the right eye 2 (two) times daily.   B-D SINGLE USE SWABS REGULAR Pads USE UTD FOR INSULIN INJECTIONS TID   BD Pen Needle Nano 2nd Gen 32G X 4 MM Misc Generic drug: Insulin Pen Needle USE WITH INSULIN PEN 3 TIMES A DAY   carvedilol 25 MG tablet Commonly known as: COREG Take 25 mg by mouth 2 (two) times daily.   clopidogrel 75 MG tablet Commonly known as: Plavix Take 1 tablet (75 mg total) by mouth daily. What changed: when to take this   darbepoetin 200 MCG/0.4ML Soln injection Commonly known as: ARANESP  Inject 0.4 mLs (200 mcg total) into the vein every Wednesday with hemodialysis.   diclofenac sodium 1 % Gel Commonly known as: VOLTAREN Apply 1 application topically 4 (four) times daily as needed (pain.).   docusate sodium 100 MG capsule Commonly known as: COLACE Take 100 mg by mouth 2 (two) times daily.   doxercalciferol 4 MCG/2ML injection Commonly known as: HECTOROL Inject 0.5 mLs (1 mcg total) into the vein every Monday, Wednesday, and Friday with hemodialysis.   DULoxetine 30 MG capsule Commonly known as: CYMBALTA Take 30 mg by mouth daily.    fluticasone 50 MCG/ACT nasal spray Commonly known as: FLONASE Place 1-2 sprays into both nostrils daily as needed for allergies.   gabapentin 100 MG capsule Commonly known as: NEURONTIN Take 100 mg by mouth 2 (two) times daily.   lanthanum 1000 MG chewable tablet Commonly known as: FOSRENOL Chew 1,000-3,000 mg by mouth See admin instructions. Take 3 tablets (3000 mg) by mouth with each meal & take 1 tablet (1000 mg) by mouth with each snack   lisinopril 40 MG tablet Commonly known as: ZESTRIL Take 40 mg by mouth at bedtime.   MIRCERA IJ Mircera   mupirocin ointment 2 % Commonly known as: BACTROBAN Apply to affected area right foot once daily What changed:   how much to take  how to take this  when to take this   NovoLOG FlexPen 100 UNIT/ML FlexPen Generic drug: insulin aspart Inject 6-10 Units into the skin 3 (three) times daily with meals. per Sliding scale   omeprazole 40 MG capsule Commonly known as: PRILOSEC Take 1 capsule (40 mg total) by mouth daily. What changed: when to take this   OneTouch Verio test strip Generic drug: glucose blood 3 (three) times daily. for testing   prednisoLONE acetate 1 % ophthalmic suspension Commonly known as: PRED FORTE Place 1 drop into the right eye 2 (two) times daily.   Rena-Vite Rx 1 MG Tabs Take 1 tablet by mouth daily with lunch.   traMADol 50 MG tablet Commonly known as: ULTRAM Take 1 tablet (50 mg total) by mouth every 6 (six) hours as needed for moderate pain.            Durable Medical Equipment  (From admission, onward)         Start     Ordered   09/01/19 1531  For home use only DME Hospital bed  Once    Question Answer Comment  Length of Need Lifetime   Patient has (list medical condition): blind, left BKA, ESRD   The above medical condition requires: Patient requires the ability to reposition frequently   Head must be elevated greater than: 30 degrees   Bed type Semi-electric   Support Surface:  Gel Overlay      09/01/19 1531   09/01/19 1528  For home use only DME lightweight manual wheelchair with seat cushion  Once    Comments: Patient suffers from new L BKA which impairs their ability to perform daily activities like toileting and grooming in the home.  A rolling walker will not resolve  issue with performing activities of daily living. A wheelchair will allow patient to safely perform daily activities. Patient is not able to propel themselves in the home using a standard weight wheelchair due to blind and weakness. Patient can self propel in the lightweight wheelchair. Length of need lifetime. Accessories: elevating leg rests (ELRs), wheel locks, extensions and anti-tippers.   09/01/19 1531  Activity: activity as tolerated Diet: renal diet Wound Care: keep wound clean and dry  Follow-up with Dr. Oneida Alar in 4 weeks.  Signed: Dagoberto Ligas, PA-C 09/02/2019 9:19 AM [facility] Office: [phone]

## 2019-09-02 NOTE — Discharge Instructions (Signed)
Incision and Drainage, Care After This sheet gives you information about how to care for yourself after your procedure. Your health care provider may also give you more specific instructions. If you have problems or questions, contact your health care provider. What can I expect after the procedure? After the procedure, it is common to have:  Pain or discomfort around the incision site.  Blood, fluid, or pus (drainage) from the incision.  Redness and firm skin around the incision site. Follow these instructions at home: Medicines  Take over-the-counter and prescription medicines only as told by your health care provider.  If you were prescribed an antibiotic medicine, use or take it as told by your health care provider. Do not stop using the antibiotic even if you start to feel better. Wound care Follow instructions from your health care provider about how to take care of your wound. Make sure you:  Wash your hands with soap and water before and after you change your bandage (dressing). If soap and water are not available, use hand sanitizer.  Change your dressing and packing as told by your health care provider. ? If your dressing is dry or stuck when you try to remove it, moisten or wet the dressing with saline or water so that it can be removed without harming your skin or tissues. ? If your wound is packed, leave it in place until your health care provider tells you to remove it. To remove the packing, moisten or wet the packing with saline or water so that it can be removed without harming your skin or tissues.  Leave stitches (sutures), skin glue, or adhesive strips in place. These skin closures may need to stay in place for 2 weeks or longer. If adhesive strip edges start to loosen and curl up, you may trim the loose edges. Do not remove adhesive strips completely unless your health care provider tells you to do that. Check your wound every day for signs of infection. Check  for:  More redness, swelling, or pain.  More fluid or blood.  Warmth.  Pus or a bad smell. If you were sent home with a drain tube in place, follow instructions from your health care provider about:  How to empty it.  How to care for it at home.  General instructions  Rest the affected area.  Do not take baths, swim, or use a hot tub until your health care provider approves. Ask your health care provider if you may take showers. You may only be allowed to take sponge baths.  Return to your normal activities as told by your health care provider. Ask your health care provider what activities are safe for you. Your health care provider may put you on activity or lifting restrictions.  The incision will continue to drain. It is normal to have some clear or slightly bloody drainage. The amount of drainage should lessen each day.  Do not apply any creams, ointments, or liquids unless you have been told to by your health care provider.  Keep all follow-up visits as told by your health care provider. This is important. Contact a health care provider if:  Your cyst or abscess returns.  You have a fever or chills.  You have more redness, swelling, or pain around your incision.  You have more fluid or blood coming from your incision.  Your incision feels warm to the touch.  You have pus or a bad smell coming from your incision.  You have red streaks   above or below the incision site. Get help right away if:  You have severe pain or bleeding.  You cannot eat or drink without vomiting.  You have decreased urine output.  You become short of breath.  You have chest pain.  You cough up blood.  The affected area becomes numb or starts to tingle. These symptoms may represent a serious problem that is an emergency. Do not wait to see if the symptoms will go away. Get medical help right away. Call your local emergency services (911 in the [place] Do not drive yourself to the  hospital. Summary  After this procedure, it is common to have fluid, blood, or pus coming from the surgery site.  Follow all home care instructions. You will be told how to take care of your incision, how to check for infection, and how to take medicines.  If you were prescribed an antibiotic medicine, take it as told by your health care provider. Do not stop taking the antibiotic even if you start to feel better.  Contact a health care provider if you have increased redness, swelling, or pain around your incision. Get help right away if you have chest pain, you vomit, you cough up blood, or you have shortness of breath.  Keep all follow-up visits as told by your health care provider. This is important. This information is not intended to replace advice given to you by your health care provider. Make sure you discuss any questions you have with your health care provider. Document Revised: 06/07/2018 Document Reviewed: 06/07/2018 Elsevier Patient Education  2020 [employer]   

## 2019-09-02 NOTE — Plan of Care (Signed)
  Problem: Education: Goal: Knowledge of General Education information will improve Description: Including pain rating scale, medication(s)/side effects and non-pharmacologic comfort measures Outcome: Progressing   Problem: Health Behavior/Discharge Planning: Goal: Ability to manage health-related needs will improve Outcome: Progressing   Problem: Clinical Measurements: Goal: Will remain free from infection Outcome: Progressing   Problem: Coping: Goal: Level of anxiety will decrease Outcome: Progressing   Problem: Elimination: Goal: Will not experience complications related to bowel motility Outcome: Progressing   Problem: Pain Managment: Goal: General experience of comfort will improve Outcome: Progressing   Problem: Safety: Goal: Ability to remain free from injury will improve Outcome: Progressing   Problem: Skin Integrity: Goal: Risk for impaired skin integrity will decrease Outcome: Progressing   Problem: Education: Goal: Knowledge of the prescribed therapeutic regimen will improve Outcome: Progressing Goal: Ability to verbalize activity precautions or restrictions will improve Outcome: Progressing   Problem: Activity: Goal: Ability to perform//tolerate increased activity and mobilize with assistive devices will improve Outcome: Progressing   Problem: Clinical Measurements: Goal: Postoperative complications will be avoided or minimized Outcome: Progressing   Problem: Self-Care: Goal: Ability to meet self-care needs will improve Outcome: Progressing

## 2019-09-02 NOTE — Care Management (Signed)
Spoke with [name] with  with [employer] . Delivery of hospital bed and wheel chair is scheduled for this evening at 6pm. NCM spoke to patient's sister [name] . Once she has received hospital bed and wheel chair she will contact patient's nurse. At that time nurse will call PTAR. PTAR paperwork in shadow chart.  Magdalen Spatz RN

## 2019-09-02 NOTE — Progress Notes (Signed)
[facility] Progress Note   Subjective: Seen on HD. Tolerating 2.5L UF so far.  Hgb 6.9 this am >>tranfuse 2 U on HD.   Objective Vitals:   09/02/19 1234 09/02/19 1244 09/02/19 1245 09/02/19 1259  BP: (!) 106/43 112/62 112/62 (!) 138/45  Pulse: 65 65 65 63  Resp: 18 18 18 17   Temp: 98.2 F (36.8 C) 98.4 F (36.9 C) 98.4 F (36.9 C) 98.3 F (36.8 C)  TempSrc: Oral Oral Oral Oral  SpO2: 99% 99% 99% 99%  Weight:      Height:        Weight change:    Additional Objective Labs: Basic Metabolic Panel: Recent Labs  Lab 08/31/19 0403 09/01/19 0220 09/02/19 0940  NA 138 135 132*  K 4.3 4.6 4.7  CL 94* 92* 93*  CO2 26 28 24   GLUCOSE 85 128* 151*  BUN 40* 22 42*  CREATININE 9.86* 7.06* 9.86*  CALCIUM 8.0* 8.4* 8.5*  PHOS  --   --  4.9*   CBC: Recent Labs  Lab 08/30/19 0603 08/30/19 0712 08/31/19 0403 09/01/19 0220 09/02/19 0940  WBC 9.6   < > 10.8* 9.8 9.7  HGB 9.7*   < > 7.7* 7.6* 6.9*  HCT 29.0*   < > 24.0* 23.6* 20.9*  MCV 93.2  --  94.9 95.2 92.9  PLT 213   < > 259 276 248   < > = values in this interval not displayed.   Blood Culture    Component Value Date/Time   SDES SPUTUM 05/07/2018 0818   SDES URINE, RANDOM 05/07/2018 0818   SPECREQUEST NONE 05/07/2018 0818   SPECREQUEST NONE 05/07/2018 0818   CULT  05/06/2018 2247    NO GROWTH 5 DAYS Performed at [facility], [place] [address] [place], [place] [address]    REPTSTATUS 05/07/2018 FINAL 05/07/2018 0818   REPTSTATUS 05/07/2018 FINAL 05/07/2018 0818     Physical Exam General: Alert, nad  Heart: RRR  Lungs: CTAB  Abdomen: soft non-tender  Extremities: No sig LE edema. L BKA stump bandaged  Dialysis Access: RUE AVF +bruit   Medications: . magnesium sulfate bolus IVPB     . sodium chloride   Intravenous Once  . aspirin EC  81 mg Oral Daily  . atropine  1 drop Right Eye BID  . carvedilol  25 mg Oral BID  . Chlorhexidine Gluconate Cloth  6 each Topical Daily  .  clopidogrel  75 mg Oral QHS  . [START ON 09/14/2019] darbepoetin (ARANESP) injection - DIALYSIS  100 mcg Intravenous Q Wed-HD  . docusate sodium  100 mg Oral BID  . doxercalciferol  4 mcg Intravenous Q M,W,F-HD  . DULoxetine  30 mg Oral Daily  . gabapentin  100 mg Oral BID  . heparin  5,000 Units Subcutaneous Q8H  . insulin aspart  0-15 Units Subcutaneous TID WC  . lanthanum  3,000 mg Oral TID WC  . lisinopril  20 mg Oral QHS  . multivitamin  1 tablet Oral QHS  . pantoprazole  40 mg Oral BID  . prednisoLONE acetate  1 drop Right Eye BID   Dialysis Orders: Center:[facility]. EDW92HD Bath2k, 2.25caTime 4hrHeparin NONE. AccessRUA AVF  hec 33mcg IV/HD / Mircera 2mcg iv q 2wks (last on 08/29/19) Venofer 100mg  q wk /HD   Problem/Plan: 1. PAD/SP L BKA Dr Oneida Alar 2/2 Gangrene Left foot. For discharge today.  2. ESRD -HD MWF. HD today on schedule.  3. Hypertension/volume - bp stable ,  has 3 home bp meds Amlodipine 5 mg q d, Lisinopril 40 mg hs, Carvedilol 25 mg bid , will decr home sec lowish bp with hd 4. Anemia -Mircera given OP 2/8. For transfusion 2U prbcs today.  5. Metabolic bone disease -Iv Vit d on hd and binders 6. Abd pain - wu per admit team , HO Mallory Weiss/ PUD on protonix 7. Nutrition -renal /carb mod , renal vit  8. DM type 2 - per admit 9. HO CAD/ LVEF 45-50 % mor MR/ TR, mild AS- stable currently  Lynnda Child PA-C [facility] Pager [phone] 09/02/2019,1:14 PM  LOS: 3 days

## 2019-09-02 NOTE — Progress Notes (Signed)
Patient discharge at this time. VS taken and PTAR  transporting patient home.

## 2019-09-02 NOTE — Progress Notes (Signed)
[facility]  Subjective  - More alert and oriented today.  Patient does not want CIR and wants to go home.   Objective (!) 117/47 72 99.8 F (37.7 C) (Oral) 17 100%  Intake/Output Summary (Last 24 hours) at 09/02/2019 0856 Last data filed at 09/02/2019 0500 Gross per 24 hour  Intake --  Output 0 ml  Net 0 ml    Left BKA looks good  Laboratory Lab Results: Recent Labs    08/31/19 0403 09/01/19 0220  WBC 10.8* 9.8  HGB 7.7* 7.6*  HCT 24.0* 23.6*  PLT 259 276   BMET Recent Labs    08/31/19 0403 09/01/19 0220  NA 138 135  K 4.3 4.6  CL 94* 92*  CO2 26 28  GLUCOSE 85 128*  BUN 40* 22  CREATININE 9.86* 7.06*  CALCIUM 8.0* 8.4*    COAG Lab Results  Component Value Date   INR 1.16 05/07/2018   INR 1.13 02/06/2018   INR 1.03 06/17/2013   No results found for: PTT  Assessment/Planning:  POD#3 s/p L BKA.  Pain better controlled.  Much more alert and oriented.  Does not want CIR, wants to go home.  Will plan to arrange discharge home today.  Follow-up will be arranged in 3 weeks for staple removal.    Marty Heck 09/02/2019 8:56 AM --

## 2019-09-02 NOTE — TOC Progression Note (Addendum)
Transition of Care [facility]) - Progression Note    Patient Details  Name: [name] MRN: [id number] Date of Birth: 1951-06-03  Transition of Care [facility]) CM/SW Contact  Jacalyn Lefevre Edson Snowball, RN Phone Number: 09/02/2019, 11:43 AM  Clinical Narrative:     Patient for discharge today. PTAR paperwork in shadow chart.  Spoke to sister [place] via phone. [name] was told discharge would be Saturday so when [employer] called to arrange a delivery time yesterday she told them today. Per [name] she is waiting on a call from [employer] for a delivery time for today. [name] needs hospital bed and wheel chair delivered prior to her sister coming home.   Spoke with Zack with [employer], he will call office for a delivery time. Awaiting call back.  Patient currently on oxygen, does not have home oxygen will need to wean or order for home   Expected Discharge Plan: [place] Barriers to Discharge: Continued Medical Work up  Expected Discharge Plan and Services Expected Discharge Plan: [place] In-house Referral: Clinical Social Work Discharge Planning Services: CM Consult Post Acute Care Choice: Home Health, Durable Medical Equipment Living arrangements for the past 2 months: Single Family Home Expected Discharge Date: 09/02/19               DME Arranged: Hospital bed, Wheelchair manual DME Agency: [employer] Date DME Agency Contacted: 09/01/19 Time DME Agency Contacted: 51 Representative spoke with at DME Agency: Thedore Mins HH Arranged: OT, PT [place] Agency: [facility] Date [place]: 09/01/19 Time [place]: 1537 Representative spoke with at [place]: [place] ([place]) Interventions    Readmission Risk Interventions No flowsheet data found.

## 2019-09-03 ENCOUNTER — Telehealth: Payer: Self-pay | Admitting: Nephrology

## 2019-09-03 LAB — TYPE AND SCREEN
ABO/RH(D): A POS
Antibody Screen: NEGATIVE
Unit division: 0
Unit division: 0

## 2019-09-03 LAB — BPAM RBC
Blood Product Expiration Date: 202103152359
Blood Product Expiration Date: 202103152359
ISSUE DATE / TIME: 202102121214
ISSUE DATE / TIME: 202102121214
Unit Type and Rh: 6200
Unit Type and Rh: 6200

## 2019-09-03 NOTE — Telephone Encounter (Signed)
Transition of care contact from inpatient facility  Date of discharge: 09/02/19 Date of contact: 09/03/19 Method: Phone Spoke to: Patient, sister [name]   Patient contacted to discuss transition of care from recent inpatient hospitalization. Patient was admitted to [facility] from 08/30/19 to 09/02/19 with discharge diagnosis of gangrene of left foot s/p BKA.   Medication changes were reviewed.  Patient will follow up with his/her outpatient HD unit on: Monday 09/05/19  Other f/u needs include: Awaiting contact from [facility] aide PT/OT

## 2019-09-04 DIAGNOSIS — Z4781 Encounter for orthopedic aftercare following surgical amputation: Secondary | ICD-10-CM | POA: Diagnosis not present

## 2019-09-05 DIAGNOSIS — Z992 Dependence on renal dialysis: Secondary | ICD-10-CM | POA: Diagnosis not present

## 2019-09-05 DIAGNOSIS — E1129 Type 2 diabetes mellitus with other diabetic kidney complication: Secondary | ICD-10-CM | POA: Diagnosis not present

## 2019-09-05 DIAGNOSIS — N186 End stage renal disease: Secondary | ICD-10-CM | POA: Diagnosis not present

## 2019-09-05 DIAGNOSIS — N2581 Secondary hyperparathyroidism of renal origin: Secondary | ICD-10-CM | POA: Diagnosis not present

## 2019-09-05 DIAGNOSIS — D631 Anemia in chronic kidney disease: Secondary | ICD-10-CM | POA: Diagnosis not present

## 2019-09-05 DIAGNOSIS — D509 Iron deficiency anemia, unspecified: Secondary | ICD-10-CM | POA: Diagnosis not present

## 2019-09-07 DIAGNOSIS — Z89512 Acquired absence of left leg below knee: Secondary | ICD-10-CM | POA: Diagnosis not present

## 2019-09-07 DIAGNOSIS — D631 Anemia in chronic kidney disease: Secondary | ICD-10-CM | POA: Diagnosis not present

## 2019-09-07 DIAGNOSIS — N2581 Secondary hyperparathyroidism of renal origin: Secondary | ICD-10-CM | POA: Diagnosis not present

## 2019-09-07 DIAGNOSIS — D509 Iron deficiency anemia, unspecified: Secondary | ICD-10-CM | POA: Diagnosis not present

## 2019-09-07 DIAGNOSIS — N186 End stage renal disease: Secondary | ICD-10-CM | POA: Diagnosis not present

## 2019-09-07 DIAGNOSIS — Z992 Dependence on renal dialysis: Secondary | ICD-10-CM | POA: Diagnosis not present

## 2019-09-07 DIAGNOSIS — I739 Peripheral vascular disease, unspecified: Secondary | ICD-10-CM | POA: Diagnosis not present

## 2019-09-07 DIAGNOSIS — E1129 Type 2 diabetes mellitus with other diabetic kidney complication: Secondary | ICD-10-CM | POA: Diagnosis not present

## 2019-09-08 DIAGNOSIS — I739 Peripheral vascular disease, unspecified: Secondary | ICD-10-CM | POA: Diagnosis not present

## 2019-09-08 DIAGNOSIS — N186 End stage renal disease: Secondary | ICD-10-CM | POA: Diagnosis not present

## 2019-09-08 DIAGNOSIS — Z89512 Acquired absence of left leg below knee: Secondary | ICD-10-CM | POA: Diagnosis not present

## 2019-09-08 DIAGNOSIS — K922 Gastrointestinal hemorrhage, unspecified: Secondary | ICD-10-CM | POA: Diagnosis not present

## 2019-09-08 DIAGNOSIS — G8929 Other chronic pain: Secondary | ICD-10-CM | POA: Diagnosis not present

## 2019-09-08 DIAGNOSIS — F329 Major depressive disorder, single episode, unspecified: Secondary | ICD-10-CM | POA: Diagnosis not present

## 2019-09-08 DIAGNOSIS — R05 Cough: Secondary | ICD-10-CM | POA: Diagnosis not present

## 2019-09-08 DIAGNOSIS — H540X55 Blindness right eye category 5, blindness left eye category 5: Secondary | ICD-10-CM | POA: Diagnosis not present

## 2019-09-08 DIAGNOSIS — E1129 Type 2 diabetes mellitus with other diabetic kidney complication: Secondary | ICD-10-CM | POA: Diagnosis not present

## 2019-09-09 DIAGNOSIS — E1129 Type 2 diabetes mellitus with other diabetic kidney complication: Secondary | ICD-10-CM | POA: Diagnosis not present

## 2019-09-09 DIAGNOSIS — N186 End stage renal disease: Secondary | ICD-10-CM | POA: Diagnosis not present

## 2019-09-09 DIAGNOSIS — D509 Iron deficiency anemia, unspecified: Secondary | ICD-10-CM | POA: Diagnosis not present

## 2019-09-09 DIAGNOSIS — D631 Anemia in chronic kidney disease: Secondary | ICD-10-CM | POA: Diagnosis not present

## 2019-09-09 DIAGNOSIS — N2581 Secondary hyperparathyroidism of renal origin: Secondary | ICD-10-CM | POA: Diagnosis not present

## 2019-09-09 DIAGNOSIS — Z992 Dependence on renal dialysis: Secondary | ICD-10-CM | POA: Diagnosis not present

## 2019-09-12 ENCOUNTER — Telehealth (HOSPITAL_COMMUNITY): Payer: Self-pay

## 2019-09-12 DIAGNOSIS — D631 Anemia in chronic kidney disease: Secondary | ICD-10-CM | POA: Diagnosis not present

## 2019-09-12 DIAGNOSIS — Z992 Dependence on renal dialysis: Secondary | ICD-10-CM | POA: Diagnosis not present

## 2019-09-12 DIAGNOSIS — E1129 Type 2 diabetes mellitus with other diabetic kidney complication: Secondary | ICD-10-CM | POA: Diagnosis not present

## 2019-09-12 DIAGNOSIS — N2581 Secondary hyperparathyroidism of renal origin: Secondary | ICD-10-CM | POA: Diagnosis not present

## 2019-09-12 DIAGNOSIS — D509 Iron deficiency anemia, unspecified: Secondary | ICD-10-CM | POA: Diagnosis not present

## 2019-09-12 DIAGNOSIS — N186 End stage renal disease: Secondary | ICD-10-CM | POA: Diagnosis not present

## 2019-09-12 NOTE — Telephone Encounter (Signed)
Follow up telephone call. Pt is s/p L BKA on 08/30/19 and discharged home with sister on 09/02/19 with home health rehab and nursing services. Sister reports patient is doing well and is presently at dialysis. She says she is receiving PT/OT and her nurse is scheduled to come out today to check on her incision. Sister reports that pt hit her foot at dialysis last week when the sliding board slid out from under her but she thought everything looked "ok". I left my contact information for the [facility] home health nurse to call if she assesses any issues. Patient is scheduled for follow up with [facility] on 09/22/19. No other barriers or questions voiced.  Cletis Media RN BSN C[facility] [place]

## 2019-09-14 DIAGNOSIS — D509 Iron deficiency anemia, unspecified: Secondary | ICD-10-CM | POA: Diagnosis not present

## 2019-09-14 DIAGNOSIS — N186 End stage renal disease: Secondary | ICD-10-CM | POA: Diagnosis not present

## 2019-09-14 DIAGNOSIS — Z992 Dependence on renal dialysis: Secondary | ICD-10-CM | POA: Diagnosis not present

## 2019-09-14 DIAGNOSIS — N2581 Secondary hyperparathyroidism of renal origin: Secondary | ICD-10-CM | POA: Diagnosis not present

## 2019-09-14 DIAGNOSIS — E1129 Type 2 diabetes mellitus with other diabetic kidney complication: Secondary | ICD-10-CM | POA: Diagnosis not present

## 2019-09-14 DIAGNOSIS — D631 Anemia in chronic kidney disease: Secondary | ICD-10-CM | POA: Diagnosis not present

## 2019-09-16 DIAGNOSIS — N186 End stage renal disease: Secondary | ICD-10-CM | POA: Diagnosis not present

## 2019-09-16 DIAGNOSIS — D631 Anemia in chronic kidney disease: Secondary | ICD-10-CM | POA: Diagnosis not present

## 2019-09-16 DIAGNOSIS — D509 Iron deficiency anemia, unspecified: Secondary | ICD-10-CM | POA: Diagnosis not present

## 2019-09-16 DIAGNOSIS — N2581 Secondary hyperparathyroidism of renal origin: Secondary | ICD-10-CM | POA: Diagnosis not present

## 2019-09-16 DIAGNOSIS — Z992 Dependence on renal dialysis: Secondary | ICD-10-CM | POA: Diagnosis not present

## 2019-09-16 DIAGNOSIS — E1129 Type 2 diabetes mellitus with other diabetic kidney complication: Secondary | ICD-10-CM | POA: Diagnosis not present

## 2019-09-19 DIAGNOSIS — D631 Anemia in chronic kidney disease: Secondary | ICD-10-CM | POA: Diagnosis not present

## 2019-09-19 DIAGNOSIS — D509 Iron deficiency anemia, unspecified: Secondary | ICD-10-CM | POA: Diagnosis not present

## 2019-09-19 DIAGNOSIS — N2581 Secondary hyperparathyroidism of renal origin: Secondary | ICD-10-CM | POA: Diagnosis not present

## 2019-09-19 DIAGNOSIS — N186 End stage renal disease: Secondary | ICD-10-CM | POA: Diagnosis not present

## 2019-09-19 DIAGNOSIS — Z992 Dependence on renal dialysis: Secondary | ICD-10-CM | POA: Diagnosis not present

## 2019-09-19 DIAGNOSIS — S81802A Unspecified open wound, left lower leg, initial encounter: Secondary | ICD-10-CM | POA: Diagnosis not present

## 2019-09-19 DIAGNOSIS — E1129 Type 2 diabetes mellitus with other diabetic kidney complication: Secondary | ICD-10-CM | POA: Diagnosis not present

## 2019-09-21 ENCOUNTER — Telehealth (HOSPITAL_COMMUNITY): Payer: Self-pay

## 2019-09-21 DIAGNOSIS — Z992 Dependence on renal dialysis: Secondary | ICD-10-CM | POA: Diagnosis not present

## 2019-09-21 DIAGNOSIS — N2581 Secondary hyperparathyroidism of renal origin: Secondary | ICD-10-CM | POA: Diagnosis not present

## 2019-09-21 DIAGNOSIS — D631 Anemia in chronic kidney disease: Secondary | ICD-10-CM | POA: Diagnosis not present

## 2019-09-21 DIAGNOSIS — D509 Iron deficiency anemia, unspecified: Secondary | ICD-10-CM | POA: Diagnosis not present

## 2019-09-21 DIAGNOSIS — S81802A Unspecified open wound, left lower leg, initial encounter: Secondary | ICD-10-CM | POA: Diagnosis not present

## 2019-09-21 DIAGNOSIS — N186 End stage renal disease: Secondary | ICD-10-CM | POA: Diagnosis not present

## 2019-09-21 NOTE — Telephone Encounter (Signed)

## 2019-09-22 ENCOUNTER — Other Ambulatory Visit: Payer: Self-pay

## 2019-09-22 ENCOUNTER — Ambulatory Visit (INDEPENDENT_AMBULATORY_CARE_PROVIDER_SITE_OTHER): Payer: Self-pay | Admitting: Physician Assistant

## 2019-09-22 ENCOUNTER — Encounter: Payer: Self-pay | Admitting: Physician Assistant

## 2019-09-22 VITALS — BP 140/60 | HR 80 | Temp 98.7°F | Resp 20 | Ht 65.0 in | Wt 198.0 lb

## 2019-09-22 DIAGNOSIS — I739 Peripheral vascular disease, unspecified: Secondary | ICD-10-CM

## 2019-09-22 NOTE — Progress Notes (Signed)
POST OPERATIVE OFFICE NOTE    CC:  F/u for surgery  HPI:  This is a 69 y.o. female who is s/p left BKA here today for wound check and staple removal.   She previously had an angioplasty of her left anterior tibial artery February 2020 to heal up a infected left first toe. This eventually did heal. She has now developed a new ulceration in the left second toe.  The wound was non healing and she was scheduled for Left BKA.  She has had a fall/hit to the stump in the last week with a sliding transfer board at HD.  Her stump has been sore and had minimal bloody drainage with pain surrounding the incision.  Allergies  Allergen Reactions  . Latex Hives  . Oxycodone Other (See Comments)    Hallucinations   . Tramadol Other (See Comments)    hallucinations   . Tape Rash    63M Transpore adhesive tape. Medical tape  . Vicodin [Hydrocodone-Acetaminophen] Other (See Comments)    hallucinations    Current Outpatient Medications  Medication Sig Dispense Refill  . acetaminophen (TYLENOL) 500 MG tablet Take 500 mg by mouth 2 (two) times daily.     Marland Kitchen albuterol (PROVENTIL) (2.5 MG/63ML) 0.083% nebulizer solution Take 3 mLs (2.5 mg total) by nebulization every 6 (six) hours as needed for wheezing or shortness of breath. 75 mL 12  . Alcohol Swabs (B-D SINGLE USE SWABS REGULAR) PADS USE UTD FOR INSULIN INJECTIONS TID    . amLODipine (NORVASC) 5 MG tablet Take 5 mg by mouth daily.     Marland Kitchen aspirin EC 81 MG tablet Take 81 mg by mouth daily.    Marland Kitchen atropine 1 % ophthalmic solution Place 1 drop into the right eye 2 (two) times daily.  0  . B Complex-C-Folic Acid (RENA-VITE RX) 1 MG TABS Take 1 tablet by mouth daily with lunch.   1  . BD PEN NEEDLE NANO 2ND GEN 32G X 4 MM MISC USE WITH INSULIN PEN 3 TIMES A DAY    . carvedilol (COREG) 25 MG tablet Take 25 mg by mouth 2 (two) times daily.  2  . clopidogrel (PLAVIX) 75 MG tablet Take 1 tablet (75 mg total) by mouth daily. (Patient taking differently: Take 75 mg by  mouth at bedtime. ) 30 tablet 11  . darbepoetin (ARANESP) 200 MCG/0.4ML SOLN injection Inject 0.4 mLs (200 mcg total) into the vein every Wednesday with hemodialysis. 1.68 mL   . diclofenac sodium (VOLTAREN) 1 % GEL Apply 1 application topically 4 (four) times daily as needed (pain.).     Marland Kitchen docusate sodium (COLACE) 100 MG capsule Take 100 mg by mouth 2 (two) times daily.     Marland Kitchen doxercalciferol (HECTOROL) 4 MCG/2ML injection Inject 0.5 mLs (1 mcg total) into the vein every Monday, Wednesday, and Friday with hemodialysis. 2 mL   . DULoxetine (CYMBALTA) 30 MG capsule Take 30 mg by mouth daily.     . fluticasone (FLONASE) 50 MCG/ACT nasal spray Place 1-2 sprays into both nostrils daily as needed for allergies.     Marland Kitchen gabapentin (NEURONTIN) 100 MG capsule Take 100 mg by mouth 2 (two) times daily.    . insulin aspart (NOVOLOG FLEXPEN) 100 UNIT/ML FlexPen Inject 6-10 Units into the skin 3 (three) times daily with meals. per Sliding scale    . lanthanum (FOSRENOL) 1000 MG chewable tablet Chew 1,000-3,000 mg by mouth See admin instructions. Take 3 tablets (3000 mg) by mouth with each  meal & take 1 tablet (1000 mg) by mouth with each snack    . lisinopril (PRINIVIL,ZESTRIL) 40 MG tablet Take 40 mg by mouth at bedtime.     . Methoxy PEG-Epoetin Beta (MIRCERA IJ) Mircera    . mupirocin ointment (BACTROBAN) 2 % Apply to affected area right foot once daily (Patient taking differently: Apply 1 application topically daily. Apply to affected area right foot once daily) 30 g 1  . omeprazole (PRILOSEC) 40 MG capsule Take 1 capsule (40 mg total) by mouth daily. (Patient taking differently: Take 40 mg by mouth 2 (two) times daily. )    . ONETOUCH VERIO test strip 3 (three) times daily. for testing  9  . prednisoLONE acetate (PRED FORTE) 1 % ophthalmic suspension Place 1 drop into the right eye 2 (two) times daily.   0  . traMADol (ULTRAM) 50 MG tablet Take 1 tablet (50 mg total) by mouth every 6 (six) hours as needed for  moderate pain. 30 tablet 0   Current Facility-Administered Medications  Medication Dose Route Frequency Provider Last Rate Last Admin  . 0.9 %  sodium chloride infusion  500 mL Intravenous Once [name], MD      . dexamethasone (DECADRON) injection 4 mg  4 mg Intramuscular Once Hilts, Michael, MD         ROS:  See HPI  Physical Exam:    Incision:  The skin surrounding the incision appears healthy and intact.  No erythema or boggy edema in the skin.  Skin integrity intact.   Extremities:  Left stump warm, no openings in incision or dehisces.  I took out most of the staples.  Bloody drainage at central incision without separation of incision.  Right foot with callus over bunion, and lateral heel with dry superficial gangrene. No erythema, or drainage or edema.  Lungs non labored breathing  Assessment/Plan:  This is a 69 y.o. female who is s/p: 4 weeks s/p left BKA.  The stump has good bloody drainage.  I will have her return in 1 week for incision check.  If the incision has healed the remainder of the staples can come out.  Dry dressing changes daily.    Angiogram performed 11/30/2018: In the right lower extremity, the right common femoral profundofemoral superficial femoral popliteal arteries are all widely patent.  They are all heavily calcified.  The anterior tibial artery is patent all the way to the level of the foot as the dorsalis pedis artery.  There are multiple segments of heavy calcification.  The peroneal and posterior tibial arteries are occluded.  The wounds are dry without signs of infection on the right foot.  I suggested we watch her right foot carefully and do not recommend intervention at this time.    Roxy Horseman PA-C [facility] [phone]  Clinic MD:  Oneida Alar

## 2019-09-23 DIAGNOSIS — N2581 Secondary hyperparathyroidism of renal origin: Secondary | ICD-10-CM | POA: Diagnosis not present

## 2019-09-23 DIAGNOSIS — D509 Iron deficiency anemia, unspecified: Secondary | ICD-10-CM | POA: Diagnosis not present

## 2019-09-23 DIAGNOSIS — N186 End stage renal disease: Secondary | ICD-10-CM | POA: Diagnosis not present

## 2019-09-23 DIAGNOSIS — S81802A Unspecified open wound, left lower leg, initial encounter: Secondary | ICD-10-CM | POA: Diagnosis not present

## 2019-09-23 DIAGNOSIS — D631 Anemia in chronic kidney disease: Secondary | ICD-10-CM | POA: Diagnosis not present

## 2019-09-23 DIAGNOSIS — Z992 Dependence on renal dialysis: Secondary | ICD-10-CM | POA: Diagnosis not present

## 2019-09-25 ENCOUNTER — Other Ambulatory Visit: Payer: Self-pay | Admitting: Physician Assistant

## 2019-09-26 DIAGNOSIS — S81802A Unspecified open wound, left lower leg, initial encounter: Secondary | ICD-10-CM | POA: Diagnosis not present

## 2019-09-26 DIAGNOSIS — D631 Anemia in chronic kidney disease: Secondary | ICD-10-CM | POA: Diagnosis not present

## 2019-09-26 DIAGNOSIS — N2581 Secondary hyperparathyroidism of renal origin: Secondary | ICD-10-CM | POA: Diagnosis not present

## 2019-09-26 DIAGNOSIS — D509 Iron deficiency anemia, unspecified: Secondary | ICD-10-CM | POA: Diagnosis not present

## 2019-09-26 DIAGNOSIS — N186 End stage renal disease: Secondary | ICD-10-CM | POA: Diagnosis not present

## 2019-09-26 DIAGNOSIS — Z992 Dependence on renal dialysis: Secondary | ICD-10-CM | POA: Diagnosis not present

## 2019-09-27 NOTE — H&P (View-Only) (Signed)
POST OPERATIVE OFFICE NOTE    CC:  F/u for surgery  HPI:  This is a 69 y.o. 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 Oneida Alar.  She was last seen on 09/22/2019 for wound check and staple removal.  She has had a fall/hit to the stump in the last week with a sliding transfer board at HD.  Her stump has been sore and had minimal bloody drainage with pain surrounding the incision.   She was advised to return in one week for incision check and if the incision has healed, the staples can be removed.  She has increased pain and bloody drainage on the incision of the left BKA incision.  The pain interferes with her daily activity and sleep.  She denise fever and chills.    She takes a daily aspirin and Plavix.    Allergies  Allergen Reactions  . Latex Hives  . Oxycodone Other (See Comments)    Hallucinations   . Tramadol Other (See Comments)    hallucinations   . Tape Rash    94M Transpore adhesive tape. Medical tape  . Vicodin [Hydrocodone-Acetaminophen] Other (See Comments)    hallucinations    Current Outpatient Medications  Medication Sig Dispense Refill  . acetaminophen (TYLENOL) 500 MG tablet Take 500 mg by mouth 2 (two) times daily.     Marland Kitchen albuterol (PROVENTIL) (2.5 MG/94ML) 0.083% nebulizer solution Take 3 mLs (2.5 mg total) by nebulization every 6 (six) hours as needed for wheezing or shortness of breath. 75 mL 12  . Alcohol Swabs (B-D SINGLE USE SWABS REGULAR) PADS USE UTD FOR INSULIN INJECTIONS TID    . amLODipine (NORVASC) 5 MG tablet Take 5 mg by mouth daily.     Marland Kitchen aspirin EC 81 MG tablet Take 81 mg by mouth daily.    Marland Kitchen atropine 1 % ophthalmic solution Place 1 drop into the right eye 2 (two) times daily.  0  . B Complex-C-Folic Acid (RENA-VITE RX) 1 MG TABS Take 1 tablet by mouth daily with lunch.   1  . BD PEN NEEDLE NANO 2ND GEN 32G X 4 MM MISC USE WITH INSULIN PEN 3 TIMES A DAY    . carvedilol (COREG) 25 MG tablet Take 25 mg by mouth 2 (two) times daily.  2  . clopidogrel  (PLAVIX) 75 MG tablet Take 1 tablet (75 mg total) by mouth daily. (Patient taking differently: Take 75 mg by mouth at bedtime. ) 30 tablet 11  . darbepoetin (ARANESP) 200 MCG/0.4ML SOLN injection Inject 0.4 mLs (200 mcg total) into the vein every Wednesday with hemodialysis. 1.68 mL   . diclofenac sodium (VOLTAREN) 1 % GEL Apply 1 application topically 4 (four) times daily as needed (pain.).     Marland Kitchen docusate sodium (COLACE) 100 MG capsule Take 100 mg by mouth 2 (two) times daily.     Marland Kitchen doxercalciferol (HECTOROL) 4 MCG/2ML injection Inject 0.5 mLs (1 mcg total) into the vein every Monday, Wednesday, and Friday with hemodialysis. 2 mL   . DULoxetine (CYMBALTA) 30 MG capsule Take 30 mg by mouth daily.     . fluticasone (FLONASE) 50 MCG/ACT nasal spray Place 1-2 sprays into both nostrils daily as needed for allergies.     Marland Kitchen gabapentin (NEURONTIN) 100 MG capsule Take 100 mg by mouth 2 (two) times daily.    . insulin aspart (NOVOLOG FLEXPEN) 100 UNIT/ML FlexPen Inject 6-10 Units into the skin 3 (three) times daily with meals. per Sliding scale    .  lanthanum (FOSRENOL) 1000 MG chewable tablet Chew 1,000-3,000 mg by mouth See admin instructions. Take 3 tablets (3000 mg) by mouth with each meal & take 1 tablet (1000 mg) by mouth with each snack    . lisinopril (PRINIVIL,ZESTRIL) 40 MG tablet Take 40 mg by mouth at bedtime.     . Methoxy PEG-Epoetin Beta (MIRCERA IJ) Mircera    . mupirocin ointment (BACTROBAN) 2 % Apply to affected area right foot once daily (Patient taking differently: Apply 1 application topically daily. Apply to affected area right foot once daily) 30 g 1  . omeprazole (PRILOSEC) 40 MG capsule Take 1 capsule (40 mg total) by mouth daily. (Patient taking differently: Take 40 mg by mouth 2 (two) times daily. )    . ONETOUCH VERIO test strip 3 (three) times daily. for testing  9  . prednisoLONE acetate (PRED FORTE) 1 % ophthalmic suspension Place 1 drop into the right eye 2 (two) times daily.    0  . traMADol (ULTRAM) 50 MG tablet TAKE 1 TABLET(50 MG) BY MOUTH EVERY 6 HOURS AS NEEDED FOR MODERATE PAIN 20 tablet 0   Current Facility-Administered Medications  Medication Dose Route Frequency Provider Last Rate Last Admin  . 0.9 %  sodium chloride infusion  500 mL Intravenous Once [name], MD      . dexamethasone (DECADRON) injection 4 mg  4 mg Intramuscular Once Hilts, Michael, MD         ROS:  See HPI  Physical Exam:  Vitals:   09/29/19 0818  BP: (!) 128/54  Pulse: 73  Resp: 14  Temp: (!) 97.3 F (36.3 C)  SpO2: 96%     Incision:  Medial left BKA incision with dry gangrene, remaining staples removed and large amount of venous bleeding drained out/ hematoma. Extremities:  Right posterior lateral heel dry gangrene, no open wound no erythema or edema.  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 female who is s/p: 1 month s/p left BKA with history of PAD and non healing wounds.  Ischemic changes to medial incision and intolerable pain in her stump.  I have scheduled her for irrigation and debridement of the left BKA stump.    Roxy Horseman PA-C [facility] [phone]  Clinic MD:  Oneida Alar

## 2019-09-27 NOTE — Progress Notes (Signed)
POST OPERATIVE OFFICE NOTE    CC:  F/u for surgery  HPI:  This is a 69 y.o. 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 Oneida Alar.  She was last seen on 09/22/2019 for wound check and staple removal.  She has had a fall/hit to the stump in the last week with a sliding transfer board at HD.  Her stump has been sore and had minimal bloody drainage with pain surrounding the incision.   She was advised to return in one week for incision check and if the incision has healed, the staples can be removed.  She has increased pain and bloody drainage on the incision of the left BKA incision.  The pain interferes with her daily activity and sleep.  She denise fever and chills.    She takes a daily aspirin and Plavix.    Allergies  Allergen Reactions  . Latex Hives  . Oxycodone Other (See Comments)    Hallucinations   . Tramadol Other (See Comments)    hallucinations   . Tape Rash    64M Transpore adhesive tape. Medical tape  . Vicodin [Hydrocodone-Acetaminophen] Other (See Comments)    hallucinations    Current Outpatient Medications  Medication Sig Dispense Refill  . acetaminophen (TYLENOL) 500 MG tablet Take 500 mg by mouth 2 (two) times daily.     Marland Kitchen albuterol (PROVENTIL) (2.5 MG/64ML) 0.083% nebulizer solution Take 3 mLs (2.5 mg total) by nebulization every 6 (six) hours as needed for wheezing or shortness of breath. 75 mL 12  . Alcohol Swabs (B-D SINGLE USE SWABS REGULAR) PADS USE UTD FOR INSULIN INJECTIONS TID    . amLODipine (NORVASC) 5 MG tablet Take 5 mg by mouth daily.     Marland Kitchen aspirin EC 81 MG tablet Take 81 mg by mouth daily.    Marland Kitchen atropine 1 % ophthalmic solution Place 1 drop into the right eye 2 (two) times daily.  0  . B Complex-C-Folic Acid (RENA-VITE RX) 1 MG TABS Take 1 tablet by mouth daily with lunch.   1  . BD PEN NEEDLE NANO 2ND GEN 32G X 4 MM MISC USE WITH INSULIN PEN 3 TIMES A DAY    . carvedilol (COREG) 25 MG tablet Take 25 mg by mouth 2 (two) times daily.  2  . clopidogrel  (PLAVIX) 75 MG tablet Take 1 tablet (75 mg total) by mouth daily. (Patient taking differently: Take 75 mg by mouth at bedtime. ) 30 tablet 11  . darbepoetin (ARANESP) 200 MCG/0.4ML SOLN injection Inject 0.4 mLs (200 mcg total) into the vein every Wednesday with hemodialysis. 1.68 mL   . diclofenac sodium (VOLTAREN) 1 % GEL Apply 1 application topically 4 (four) times daily as needed (pain.).     Marland Kitchen docusate sodium (COLACE) 100 MG capsule Take 100 mg by mouth 2 (two) times daily.     Marland Kitchen doxercalciferol (HECTOROL) 4 MCG/2ML injection Inject 0.5 mLs (1 mcg total) into the vein every Monday, Wednesday, and Friday with hemodialysis. 2 mL   . DULoxetine (CYMBALTA) 30 MG capsule Take 30 mg by mouth daily.     . fluticasone (FLONASE) 50 MCG/ACT nasal spray Place 1-2 sprays into both nostrils daily as needed for allergies.     Marland Kitchen gabapentin (NEURONTIN) 100 MG capsule Take 100 mg by mouth 2 (two) times daily.    . insulin aspart (NOVOLOG FLEXPEN) 100 UNIT/ML FlexPen Inject 6-10 Units into the skin 3 (three) times daily with meals. per Sliding scale    .  lanthanum (FOSRENOL) 1000 MG chewable tablet Chew 1,000-3,000 mg by mouth See admin instructions. Take 3 tablets (3000 mg) by mouth with each meal & take 1 tablet (1000 mg) by mouth with each snack    . lisinopril (PRINIVIL,ZESTRIL) 40 MG tablet Take 40 mg by mouth at bedtime.     . Methoxy PEG-Epoetin Beta (MIRCERA IJ) Mircera    . mupirocin ointment (BACTROBAN) 2 % Apply to affected area right foot once daily (Patient taking differently: Apply 1 application topically daily. Apply to affected area right foot once daily) 30 g 1  . omeprazole (PRILOSEC) 40 MG capsule Take 1 capsule (40 mg total) by mouth daily. (Patient taking differently: Take 40 mg by mouth 2 (two) times daily. )    . ONETOUCH VERIO test strip 3 (three) times daily. for testing  9  . prednisoLONE acetate (PRED FORTE) 1 % ophthalmic suspension Place 1 drop into the right eye 2 (two) times daily.    0  . traMADol (ULTRAM) 50 MG tablet TAKE 1 TABLET(50 MG) BY MOUTH EVERY 6 HOURS AS NEEDED FOR MODERATE PAIN 20 tablet 0   Current Facility-Administered Medications  Medication Dose Route Frequency Provider Last Rate Last Admin  . 0.9 %  sodium chloride infusion  500 mL Intravenous Once [name], MD      . dexamethasone (DECADRON) injection 4 mg  4 mg Intramuscular Once Hilts, Michael, MD         ROS:  See HPI  Physical Exam:  Vitals:   09/29/19 0818  BP: (!) 128/54  Pulse: 73  Resp: 14  Temp: (!) 97.3 F (36.3 C)  SpO2: 96%     Incision:  Medial left BKA incision with dry gangrene, remaining staples removed and large amount of venous bleeding drained out/ hematoma. Extremities:  Right posterior lateral heel dry gangrene, no open wound no erythema or edema.  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 female who is s/p: 1 month s/p left BKA with history of PAD and non healing wounds.  Ischemic changes to medial incision and intolerable pain in her stump.  I have scheduled her for irrigation and debridement of the left BKA stump.    Roxy Horseman PA-C [facility] [phone]  Clinic MD:  Oneida Alar

## 2019-09-28 ENCOUNTER — Telehealth (HOSPITAL_COMMUNITY): Payer: Self-pay

## 2019-09-28 DIAGNOSIS — N2581 Secondary hyperparathyroidism of renal origin: Secondary | ICD-10-CM | POA: Diagnosis not present

## 2019-09-28 DIAGNOSIS — N186 End stage renal disease: Secondary | ICD-10-CM | POA: Diagnosis not present

## 2019-09-28 DIAGNOSIS — D509 Iron deficiency anemia, unspecified: Secondary | ICD-10-CM | POA: Diagnosis not present

## 2019-09-28 DIAGNOSIS — Z992 Dependence on renal dialysis: Secondary | ICD-10-CM | POA: Diagnosis not present

## 2019-09-28 DIAGNOSIS — S81802A Unspecified open wound, left lower leg, initial encounter: Secondary | ICD-10-CM | POA: Diagnosis not present

## 2019-09-28 DIAGNOSIS — D631 Anemia in chronic kidney disease: Secondary | ICD-10-CM | POA: Diagnosis not present

## 2019-09-28 NOTE — Telephone Encounter (Signed)

## 2019-09-29 ENCOUNTER — Other Ambulatory Visit: Payer: Self-pay

## 2019-09-29 ENCOUNTER — Other Ambulatory Visit (HOSPITAL_COMMUNITY)
Admission: RE | Admit: 2019-09-29 | Discharge: 2019-09-29 | Disposition: A | Discharge status: Home / Self Care | Payer: Medicare Other | Source: Ambulatory Visit | Attending: Vascular Surgery | Admitting: Vascular Surgery

## 2019-09-29 ENCOUNTER — Ambulatory Visit (INDEPENDENT_AMBULATORY_CARE_PROVIDER_SITE_OTHER): Payer: Self-pay | Admitting: Physician Assistant

## 2019-09-29 ENCOUNTER — Encounter (HOSPITAL_COMMUNITY): Payer: Self-pay | Admitting: Vascular Surgery

## 2019-09-29 VITALS — BP 128/54 | HR 73 | Temp 97.3°F | Resp 14 | Ht 65.0 in | Wt 198.0 lb

## 2019-09-29 DIAGNOSIS — Z20822 Contact with and (suspected) exposure to covid-19: Secondary | ICD-10-CM | POA: Insufficient documentation

## 2019-09-29 DIAGNOSIS — I739 Peripheral vascular disease, unspecified: Secondary | ICD-10-CM

## 2019-09-29 DIAGNOSIS — Z01812 Encounter for preprocedural laboratory examination: Secondary | ICD-10-CM | POA: Diagnosis not present

## 2019-09-29 LAB — SARS CORONAVIRUS 2 (TAT 6-24 HRS): SARS Coronavirus 2: NEGATIVE

## 2019-09-29 NOTE — Progress Notes (Addendum)
I spoke to [name] per [name] as [name] has requesed. Patient has not had chest pain or shortness. Patient was tested for Covid todas at the MD's office.Patient is in quarantine with her sister who takes care of her.  Cathy [name] is blind, patient has [name] sign permit.  Patient has type II diabetes. [name] reports that CBG's runs 108-138. I instructed [name]  to check CBG after awaking and every 2 hours until arrival  to the hospital.  I Instructed patient if CBG is less than 70 to drink 1/2 cup of a clear juice. Recheck CBG in 15 minutes then call pre- op desk at [phone] for further instructions.   Cathy [name] had taken Plavix this am before appoinment at [facility], she will not take Plavix in am.

## 2019-09-30 ENCOUNTER — Encounter (HOSPITAL_COMMUNITY)
Admission: RE | Disposition: A | Discharge status: Home / Self Care | Payer: Self-pay | Source: Home / Self Care | Attending: Vascular Surgery

## 2019-09-30 ENCOUNTER — Ambulatory Visit (HOSPITAL_COMMUNITY): Payer: Medicare Other | Admitting: Certified Registered"

## 2019-09-30 ENCOUNTER — Observation Stay (HOSPITAL_COMMUNITY)
Admission: RE | Admit: 2019-09-30 | Discharge: 2019-10-01 | Disposition: A | Discharge status: Home / Self Care | Payer: Medicare Other | Attending: Vascular Surgery | Admitting: Vascular Surgery

## 2019-09-30 ENCOUNTER — Encounter (HOSPITAL_COMMUNITY): Payer: Self-pay | Admitting: Vascular Surgery

## 2019-09-30 DIAGNOSIS — Z79899 Other long term (current) drug therapy: Secondary | ICD-10-CM | POA: Diagnosis not present

## 2019-09-30 DIAGNOSIS — T8781 Dehiscence of amputation stump: Secondary | ICD-10-CM | POA: Insufficient documentation

## 2019-09-30 DIAGNOSIS — E8770 Fluid overload, unspecified: Secondary | ICD-10-CM | POA: Diagnosis not present

## 2019-09-30 DIAGNOSIS — Z794 Long term (current) use of insulin: Secondary | ICD-10-CM | POA: Diagnosis not present

## 2019-09-30 DIAGNOSIS — J449 Chronic obstructive pulmonary disease, unspecified: Secondary | ICD-10-CM | POA: Insufficient documentation

## 2019-09-30 DIAGNOSIS — M797 Fibromyalgia: Secondary | ICD-10-CM | POA: Diagnosis not present

## 2019-09-30 DIAGNOSIS — E1129 Type 2 diabetes mellitus with other diabetic kidney complication: Secondary | ICD-10-CM | POA: Diagnosis not present

## 2019-09-30 DIAGNOSIS — Z89512 Acquired absence of left leg below knee: Secondary | ICD-10-CM | POA: Insufficient documentation

## 2019-09-30 DIAGNOSIS — K219 Gastro-esophageal reflux disease without esophagitis: Secondary | ICD-10-CM | POA: Insufficient documentation

## 2019-09-30 DIAGNOSIS — I96 Gangrene, not elsewhere classified: Secondary | ICD-10-CM | POA: Diagnosis not present

## 2019-09-30 DIAGNOSIS — Z86010 Personal history of colonic polyps: Secondary | ICD-10-CM | POA: Insufficient documentation

## 2019-09-30 DIAGNOSIS — J45909 Unspecified asthma, uncomplicated: Secondary | ICD-10-CM | POA: Insufficient documentation

## 2019-09-30 DIAGNOSIS — Z833 Family history of diabetes mellitus: Secondary | ICD-10-CM | POA: Insufficient documentation

## 2019-09-30 DIAGNOSIS — Y835 Amputation of limb(s) as the cause of abnormal reaction of the patient, or of later complication, without mention of misadventure at the time of the procedure: Secondary | ICD-10-CM | POA: Insufficient documentation

## 2019-09-30 DIAGNOSIS — H409 Unspecified glaucoma: Secondary | ICD-10-CM | POA: Insufficient documentation

## 2019-09-30 DIAGNOSIS — E1122 Type 2 diabetes mellitus with diabetic chronic kidney disease: Secondary | ICD-10-CM | POA: Insufficient documentation

## 2019-09-30 DIAGNOSIS — Z885 Allergy status to narcotic agent status: Secondary | ICD-10-CM | POA: Insufficient documentation

## 2019-09-30 DIAGNOSIS — I132 Hypertensive heart and chronic kidney disease with heart failure and with stage 5 chronic kidney disease, or end stage renal disease: Secondary | ICD-10-CM | POA: Diagnosis not present

## 2019-09-30 DIAGNOSIS — Z7982 Long term (current) use of aspirin: Secondary | ICD-10-CM | POA: Insufficient documentation

## 2019-09-30 DIAGNOSIS — Z888 Allergy status to other drugs, medicaments and biological substances status: Secondary | ICD-10-CM | POA: Insufficient documentation

## 2019-09-30 DIAGNOSIS — I739 Peripheral vascular disease, unspecified: Secondary | ICD-10-CM | POA: Diagnosis present

## 2019-09-30 DIAGNOSIS — Z8249 Family history of ischemic heart disease and other diseases of the circulatory system: Secondary | ICD-10-CM | POA: Insufficient documentation

## 2019-09-30 DIAGNOSIS — M96840 Postprocedural hematoma of a musculoskeletal structure following a musculoskeletal system procedure: Secondary | ICD-10-CM | POA: Insufficient documentation

## 2019-09-30 DIAGNOSIS — M1990 Unspecified osteoarthritis, unspecified site: Secondary | ICD-10-CM | POA: Diagnosis not present

## 2019-09-30 DIAGNOSIS — Z7902 Long term (current) use of antithrombotics/antiplatelets: Secondary | ICD-10-CM | POA: Insufficient documentation

## 2019-09-30 DIAGNOSIS — Z853 Personal history of malignant neoplasm of breast: Secondary | ICD-10-CM | POA: Insufficient documentation

## 2019-09-30 DIAGNOSIS — N186 End stage renal disease: Secondary | ICD-10-CM | POA: Diagnosis not present

## 2019-09-30 DIAGNOSIS — I350 Nonrheumatic aortic (valve) stenosis: Secondary | ICD-10-CM | POA: Diagnosis not present

## 2019-09-30 DIAGNOSIS — H548 Legal blindness, as defined in USA: Secondary | ICD-10-CM | POA: Diagnosis not present

## 2019-09-30 DIAGNOSIS — N250 Renal osteodystrophy: Secondary | ICD-10-CM | POA: Diagnosis not present

## 2019-09-30 DIAGNOSIS — I509 Heart failure, unspecified: Secondary | ICD-10-CM | POA: Diagnosis not present

## 2019-09-30 DIAGNOSIS — D631 Anemia in chronic kidney disease: Secondary | ICD-10-CM | POA: Diagnosis not present

## 2019-09-30 DIAGNOSIS — E1151 Type 2 diabetes mellitus with diabetic peripheral angiopathy without gangrene: Principal | ICD-10-CM | POA: Insufficient documentation

## 2019-09-30 DIAGNOSIS — T8789 Other complications of amputation stump: Secondary | ICD-10-CM | POA: Diagnosis not present

## 2019-09-30 DIAGNOSIS — E8889 Other specified metabolic disorders: Secondary | ICD-10-CM | POA: Diagnosis not present

## 2019-09-30 DIAGNOSIS — I5042 Chronic combined systolic (congestive) and diastolic (congestive) heart failure: Secondary | ICD-10-CM | POA: Diagnosis not present

## 2019-09-30 DIAGNOSIS — Z992 Dependence on renal dialysis: Secondary | ICD-10-CM | POA: Diagnosis not present

## 2019-09-30 HISTORY — PX: WOUND DEBRIDEMENT: SHX247

## 2019-09-30 LAB — CBC
HCT: 27.4 % — ABNORMAL LOW (ref 36.0–46.0)
Hemoglobin: 8.7 g/dL — ABNORMAL LOW (ref 12.0–15.0)
MCH: 30.5 pg (ref 26.0–34.0)
MCHC: 31.8 g/dL (ref 30.0–36.0)
MCV: 96.1 fL (ref 80.0–100.0)
Platelets: 256 10*3/uL (ref 150–400)
RBC: 2.85 MIL/uL — ABNORMAL LOW (ref 3.87–5.11)
RDW: 17.8 % — ABNORMAL HIGH (ref 11.5–15.5)
WBC: 6 10*3/uL (ref 4.0–10.5)
nRBC: 0 % (ref 0.0–0.2)

## 2019-09-30 LAB — POCT I-STAT, CHEM 8
BUN: 29 mg/dL — ABNORMAL HIGH (ref 8–23)
Calcium, Ion: 0.99 mmol/L — ABNORMAL LOW (ref 1.15–1.40)
Chloride: 98 mmol/L (ref 98–111)
Creatinine, Ser: 7.9 mg/dL — ABNORMAL HIGH (ref 0.44–1.00)
Glucose, Bld: 103 mg/dL — ABNORMAL HIGH (ref 70–99)
HCT: 32 % — ABNORMAL LOW (ref 36.0–46.0)
Hemoglobin: 10.9 g/dL — ABNORMAL LOW (ref 12.0–15.0)
Potassium: 3.9 mmol/L (ref 3.5–5.1)
Sodium: 135 mmol/L (ref 135–145)
TCO2: 30 mmol/L (ref 22–32)

## 2019-09-30 LAB — GLUCOSE, CAPILLARY
Glucose-Capillary: 97 mg/dL (ref 70–99)
Glucose-Capillary: 115 mg/dL — ABNORMAL HIGH (ref 70–99)
Glucose-Capillary: 125 mg/dL — ABNORMAL HIGH (ref 70–99)

## 2019-09-30 LAB — CREATININE, SERUM
Creatinine, Ser: 7.29 mg/dL — ABNORMAL HIGH (ref 0.44–1.00)
GFR calc Af Amer: 6 mL/min — ABNORMAL LOW (ref >60)
GFR calc non Af Amer: 5 mL/min — ABNORMAL LOW (ref >60)

## 2019-09-30 SURGERY — DEBRIDEMENT, WOUND
Anesthesia: General | Site: Leg Lower | Laterality: Left

## 2019-09-30 MED ORDER — ASPIRIN EC 81 MG PO TBEC
81.0000 mg | DELAYED_RELEASE_TABLET | Freq: Every day | ORAL | Status: DC
  Administered 2019-10-01: 11:00:00 81 mg via ORAL
  Filled 2019-09-30: qty 1

## 2019-09-30 MED ORDER — SODIUM CHLORIDE 0.9 % IV SOLN: INTRAVENOUS | Status: DC

## 2019-09-30 MED ORDER — ONDANSETRON HCL 4 MG/2ML IJ SOLN
INTRAMUSCULAR | Status: AC
  Filled 2019-09-30: qty 2

## 2019-09-30 MED ORDER — PHENYLEPHRINE 40 MCG/ML (10ML) SYRINGE FOR IV PUSH (FOR BLOOD PRESSURE SUPPORT)
PREFILLED_SYRINGE | INTRAVENOUS | Status: AC
  Filled 2019-09-30: qty 10

## 2019-09-30 MED ORDER — LANTHANUM CARBONATE 500 MG PO CHEW
3000.0000 mg | CHEWABLE_TABLET | Freq: Three times a day (TID) | ORAL | Status: DC
  Administered 2019-10-01 (×2): 3000 mg via ORAL
  Filled 2019-09-30 (×2): qty 6

## 2019-09-30 MED ORDER — LIDOCAINE 2% (20 MG/ML) 5 ML SYRINGE
INTRAMUSCULAR | Status: DC | PRN
  Administered 2019-09-30: 60 mg via INTRAVENOUS

## 2019-09-30 MED ORDER — PROPOFOL 10 MG/ML IV BOLUS
INTRAVENOUS | Status: DC | PRN
  Administered 2019-09-30: 100 mg via INTRAVENOUS

## 2019-09-30 MED ORDER — 0.9 % SODIUM CHLORIDE (POUR BTL) OPTIME
TOPICAL | Status: DC | PRN
  Administered 2019-09-30 (×2): 1000 mL

## 2019-09-30 MED ORDER — DEXAMETHASONE SODIUM PHOSPHATE 10 MG/ML IJ SOLN
INTRAMUSCULAR | Status: AC
  Filled 2019-09-30: qty 1

## 2019-09-30 MED ORDER — LABETALOL HCL 5 MG/ML IV SOLN: 10.0000 mg | INTRAVENOUS | Status: DC | PRN

## 2019-09-30 MED ORDER — ATROPINE SULFATE 1 % OP SOLN
1.0000 [drp] | Freq: Two times a day (BID) | OPHTHALMIC | Status: DC
  Administered 2019-09-30 – 2019-10-01 (×2): 1 [drp] via OPHTHALMIC
  Filled 2019-09-30: qty 2

## 2019-09-30 MED ORDER — OXYCODONE HCL 5 MG PO TABS: 5.0000 mg | ORAL_TABLET | ORAL | Status: DC | PRN

## 2019-09-30 MED ORDER — ONDANSETRON HCL 4 MG/2ML IJ SOLN
4.0000 mg | Freq: Four times a day (QID) | INTRAMUSCULAR | Status: DC | PRN
  Filled 2019-09-30: qty 2

## 2019-09-30 MED ORDER — SODIUM CHLORIDE 0.9 % IV SOLN: 250.0000 mL | INTRAVENOUS | Status: DC | PRN

## 2019-09-30 MED ORDER — MIDAZOLAM HCL 2 MG/2ML IJ SOLN
INTRAMUSCULAR | Status: AC
  Filled 2019-09-30: qty 2

## 2019-09-30 MED ORDER — ALBUTEROL SULFATE HFA 108 (90 BASE) MCG/ACT IN AERS: 1.0000 | INHALATION_SPRAY | Freq: Four times a day (QID) | RESPIRATORY_TRACT | Status: DC | PRN

## 2019-09-30 MED ORDER — ALUM & MAG HYDROXIDE-SIMETH 200-200-20 MG/5ML PO SUSP: 15.0000 mL | ORAL | Status: DC | PRN

## 2019-09-30 MED ORDER — MORPHINE SULFATE (PF) 2 MG/ML IV SOLN
2.0000 mg | INTRAVENOUS | Status: DC | PRN
  Administered 2019-09-30 – 2019-10-01 (×2): 2 mg via INTRAVENOUS
  Filled 2019-09-30 (×2): qty 1

## 2019-09-30 MED ORDER — METOPROLOL TARTRATE 5 MG/5ML IV SOLN: 2.0000 mg | INTRAVENOUS | Status: DC | PRN

## 2019-09-30 MED ORDER — NEPRO/CARBSTEADY PO LIQD
237.0000 mL | Freq: Two times a day (BID) | ORAL | Status: DC
  Administered 2019-10-01: 237 mL via ORAL

## 2019-09-30 MED ORDER — ACETAMINOPHEN 325 MG PO TABS
325.0000 mg | ORAL_TABLET | ORAL | Status: DC | PRN
  Administered 2019-09-30 – 2019-10-01 (×3): 650 mg via ORAL
  Filled 2019-09-30 (×2): qty 2

## 2019-09-30 MED ORDER — PROPOFOL 10 MG/ML IV BOLUS
INTRAVENOUS | Status: AC
  Filled 2019-09-30: qty 20

## 2019-09-30 MED ORDER — FENTANYL CITRATE (PF) 250 MCG/5ML IJ SOLN
INTRAMUSCULAR | Status: AC
  Filled 2019-09-30: qty 5

## 2019-09-30 MED ORDER — ENOXAPARIN SODIUM 40 MG/0.4ML ~~LOC~~ SOLN: 40.0000 mg | SUBCUTANEOUS | Status: DC

## 2019-09-30 MED ORDER — DEXAMETHASONE SODIUM PHOSPHATE 10 MG/ML IJ SOLN
INTRAMUSCULAR | Status: DC | PRN
  Administered 2019-09-30: 5 mg via INTRAVENOUS

## 2019-09-30 MED ORDER — PHENOL 1.4 % MT LIQD: 1.0000 | OROMUCOSAL | Status: DC | PRN

## 2019-09-30 MED ORDER — ONDANSETRON HCL 4 MG/2ML IJ SOLN
INTRAMUSCULAR | Status: DC | PRN
  Administered 2019-09-30: 4 mg via INTRAVENOUS

## 2019-09-30 MED ORDER — SODIUM CHLORIDE 0.9% FLUSH: 3.0000 mL | INTRAVENOUS | Status: DC | PRN

## 2019-09-30 MED ORDER — ALBUTEROL SULFATE (2.5 MG/3ML) 0.083% IN NEBU: 2.5000 mg | INHALATION_SOLUTION | Freq: Four times a day (QID) | RESPIRATORY_TRACT | Status: DC | PRN

## 2019-09-30 MED ORDER — RENA-VITE PO TABS
1.0000 | ORAL_TABLET | Freq: Every day | ORAL | Status: DC
  Administered 2019-09-30 – 2019-10-01 (×2): 1 via ORAL
  Filled 2019-09-30 (×2): qty 1

## 2019-09-30 MED ORDER — CLOPIDOGREL BISULFATE 75 MG PO TABS
75.0000 mg | ORAL_TABLET | Freq: Every day | ORAL | Status: DC
  Administered 2019-10-01: 11:00:00 75 mg via ORAL
  Filled 2019-09-30: qty 1

## 2019-09-30 MED ORDER — LISINOPRIL 40 MG PO TABS
40.0000 mg | ORAL_TABLET | Freq: Every day | ORAL | Status: DC
  Administered 2019-09-30 – 2019-10-01 (×2): 40 mg via ORAL
  Filled 2019-09-30 (×2): qty 1

## 2019-09-30 MED ORDER — ONDANSETRON HCL 4 MG/2ML IJ SOLN: 4.0000 mg | Freq: Once | INTRAMUSCULAR | Status: DC | PRN

## 2019-09-30 MED ORDER — CARVEDILOL 25 MG PO TABS
25.0000 mg | ORAL_TABLET | Freq: Two times a day (BID) | ORAL | Status: DC
  Administered 2019-09-30 – 2019-10-01 (×3): 25 mg via ORAL
  Filled 2019-09-30 (×3): qty 1

## 2019-09-30 MED ORDER — ACETAMINOPHEN 325 MG PO TABS
ORAL_TABLET | ORAL | Status: AC
  Filled 2019-09-30: qty 2

## 2019-09-30 MED ORDER — FENTANYL CITRATE (PF) 100 MCG/2ML IJ SOLN
INTRAMUSCULAR | Status: AC
  Filled 2019-09-30: qty 2

## 2019-09-30 MED ORDER — CEFAZOLIN SODIUM-DEXTROSE 2-4 GM/100ML-% IV SOLN
2.0000 g | INTRAVENOUS | Status: AC
  Administered 2019-09-30: 2 g via INTRAVENOUS
  Filled 2019-09-30: qty 100

## 2019-09-30 MED ORDER — DOCUSATE SODIUM 100 MG PO CAPS
100.0000 mg | ORAL_CAPSULE | Freq: Two times a day (BID) | ORAL | Status: DC
  Administered 2019-09-30 – 2019-10-01 (×2): 100 mg via ORAL
  Filled 2019-09-30 (×3): qty 1

## 2019-09-30 MED ORDER — DOXERCALCIFEROL 4 MCG/2ML IV SOLN
INTRAVENOUS | Status: AC
  Administered 2019-09-30: 2 ug via INTRAVENOUS
  Filled 2019-09-30: qty 2

## 2019-09-30 MED ORDER — ENOXAPARIN SODIUM 30 MG/0.3ML ~~LOC~~ SOLN
30.0000 mg | SUBCUTANEOUS | Status: DC
  Administered 2019-10-01: 30 mg via SUBCUTANEOUS
  Filled 2019-09-30: qty 0.3

## 2019-09-30 MED ORDER — CHLORHEXIDINE GLUCONATE CLOTH 2 % EX PADS: 6.0000 | MEDICATED_PAD | Freq: Once | CUTANEOUS | Status: DC

## 2019-09-30 MED ORDER — MORPHINE SULFATE (PF) 2 MG/ML IV SOLN
INTRAVENOUS | Status: AC
  Administered 2019-09-30: 2 mg via INTRAVENOUS
  Filled 2019-09-30: qty 1

## 2019-09-30 MED ORDER — ACETAMINOPHEN 325 MG RE SUPP: 325.0000 mg | RECTAL | Status: DC | PRN

## 2019-09-30 MED ORDER — CHLORHEXIDINE GLUCONATE CLOTH 2 % EX PADS: 6.0000 | MEDICATED_PAD | Freq: Every day | CUTANEOUS | Status: DC

## 2019-09-30 MED ORDER — PHENYLEPHRINE HCL-NACL 10-0.9 MG/250ML-% IV SOLN
INTRAVENOUS | Status: DC | PRN
  Administered 2019-09-30: 45 ug/min via INTRAVENOUS

## 2019-09-30 MED ORDER — HYDRALAZINE HCL 20 MG/ML IJ SOLN: 5.0000 mg | INTRAMUSCULAR | Status: DC | PRN

## 2019-09-30 MED ORDER — PHENYLEPHRINE HCL (PRESSORS) 10 MG/ML IV SOLN
INTRAVENOUS | Status: DC | PRN
  Administered 2019-09-30: 120 ug via INTRAVENOUS

## 2019-09-30 MED ORDER — GUAIFENESIN-DM 100-10 MG/5ML PO SYRP: 15.0000 mL | ORAL_SOLUTION | ORAL | Status: DC | PRN

## 2019-09-30 MED ORDER — FENTANYL CITRATE (PF) 100 MCG/2ML IJ SOLN
INTRAMUSCULAR | Status: AC
  Administered 2019-09-30: 12:00:00 50 ug via INTRAVENOUS
  Filled 2019-09-30: qty 2

## 2019-09-30 MED ORDER — FENTANYL CITRATE (PF) 100 MCG/2ML IJ SOLN
25.0000 ug | INTRAMUSCULAR | Status: DC | PRN
  Administered 2019-09-30 (×2): 50 ug via INTRAVENOUS

## 2019-09-30 MED ORDER — PANTOPRAZOLE SODIUM 40 MG PO TBEC
40.0000 mg | DELAYED_RELEASE_TABLET | Freq: Every day | ORAL | Status: DC
  Administered 2019-10-01: 40 mg via ORAL
  Filled 2019-09-30: qty 1

## 2019-09-30 MED ORDER — SODIUM CHLORIDE 0.9% FLUSH
3.0000 mL | Freq: Two times a day (BID) | INTRAVENOUS | Status: DC
  Administered 2019-09-30: 22:00:00 3 mL via INTRAVENOUS

## 2019-09-30 MED ORDER — INSULIN ASPART 100 UNIT/ML ~~LOC~~ SOLN
0.0000 [IU] | Freq: Three times a day (TID) | SUBCUTANEOUS | Status: DC
  Administered 2019-10-01: 12:00:00 3 [IU] via SUBCUTANEOUS
  Administered 2019-10-01: 18:00:00 2 [IU] via SUBCUTANEOUS

## 2019-09-30 MED ORDER — LIDOCAINE 2% (20 MG/ML) 5 ML SYRINGE
INTRAMUSCULAR | Status: AC
  Filled 2019-09-30: qty 5

## 2019-09-30 MED ORDER — AMLODIPINE BESYLATE 5 MG PO TABS: 5.0000 mg | ORAL_TABLET | Freq: Every day | ORAL | Status: DC | PRN

## 2019-09-30 MED ORDER — ALBUMIN HUMAN 5 % IV SOLN: INTRAVENOUS | Status: DC | PRN

## 2019-09-30 MED ORDER — MIDAZOLAM HCL 5 MG/5ML IJ SOLN
INTRAMUSCULAR | Status: DC | PRN
  Administered 2019-09-30 (×2): 1 mg via INTRAVENOUS

## 2019-09-30 MED ORDER — GABAPENTIN 100 MG PO CAPS
100.0000 mg | ORAL_CAPSULE | Freq: Two times a day (BID) | ORAL | Status: DC
  Administered 2019-09-30 – 2019-10-01 (×3): 100 mg via ORAL
  Filled 2019-09-30 (×3): qty 1

## 2019-09-30 MED ORDER — DOXERCALCIFEROL 4 MCG/2ML IV SOLN: 2.0000 ug | INTRAVENOUS | Status: DC

## 2019-09-30 MED ORDER — PREDNISOLONE ACETATE 1 % OP SUSP
1.0000 [drp] | Freq: Two times a day (BID) | OPHTHALMIC | Status: DC
  Administered 2019-09-30 – 2019-10-01 (×3): 1 [drp] via OPHTHALMIC
  Filled 2019-09-30: qty 5

## 2019-09-30 SURGICAL SUPPLY — 29 items
BNDG ELASTIC 4X5.8 VLCR STR LF (GAUZE/BANDAGES/DRESSINGS) IMPLANT
BNDG ELASTIC 6X5.8 VLCR STR LF (GAUZE/BANDAGES/DRESSINGS) IMPLANT
BNDG GAUZE ELAST 4 BULKY (GAUZE/BANDAGES/DRESSINGS) IMPLANT
CANISTER SUCT 3000ML PPV (MISCELLANEOUS) ×2 IMPLANT
CANISTER WOUNDNEG PRESSURE 500 (CANNISTER) ×1 IMPLANT
COVER SURGICAL LIGHT HANDLE (MISCELLANEOUS) ×2 IMPLANT
COVER WAND RF STERILE (DRAPES) ×1 IMPLANT
DRSG VAC ATS MED SENSATRAC (GAUZE/BANDAGES/DRESSINGS) ×1 IMPLANT
ELECT REM PT RETURN 9FT ADLT (ELECTROSURGICAL) ×2
ELECTRODE REM PT RTRN 9FT ADLT (ELECTROSURGICAL) ×1 IMPLANT
GAUZE SPONGE 4X4 12PLY STRL (GAUZE/BANDAGES/DRESSINGS) ×2 IMPLANT
GLOVE BIO SURGEON STRL SZ7.5 (GLOVE) ×1 IMPLANT
GLOVE SURG SS PI 8.0 STRL IVOR (GLOVE) ×1 IMPLANT
GOWN STRL REUS W/ TWL LRG LVL3 (GOWN DISPOSABLE) ×3 IMPLANT
GOWN STRL REUS W/TWL LRG LVL3 (GOWN DISPOSABLE) ×6
KIT BASIN OR (CUSTOM PROCEDURE TRAY) ×2 IMPLANT
KIT TURNOVER KIT B (KITS) ×2 IMPLANT
NS IRRIG 1000ML POUR BTL (IV SOLUTION) ×2 IMPLANT
PACK GENERAL/GYN (CUSTOM PROCEDURE TRAY) ×1 IMPLANT
PACK UNIVERSAL I (CUSTOM PROCEDURE TRAY) IMPLANT
PAD ARMBOARD 7.5X6 YLW CONV (MISCELLANEOUS) ×4 IMPLANT
STAPLER VISISTAT 35W (STAPLE) IMPLANT
SUT ETHILON 3 0 PS 1 (SUTURE) IMPLANT
SUT VIC AB 2-0 CTX 36 (SUTURE) IMPLANT
SUT VIC AB 3-0 SH 27 (SUTURE)
SUT VIC AB 3-0 SH 27X BRD (SUTURE) IMPLANT
SUT VICRYL 4-0 PS2 18IN ABS (SUTURE) IMPLANT
TOWEL GREEN STERILE (TOWEL DISPOSABLE) ×2 IMPLANT
WATER STERILE IRR 1000ML POUR (IV SOLUTION) ×2 IMPLANT

## 2019-09-30 NOTE — Progress Notes (Signed)
Patient arrived to 4E room 12 at this time from the PACU. Telemetry applied and CCMD notified. V/s and assessment complete. Patient oriented to room and how to call nurse with any needs. Patient is blind in both eyes. CHG bath done. Wound vac clean dry and intact. Will continue to monitor.   Emelda Fear

## 2019-09-30 NOTE — Plan of Care (Signed)
Continue to monitor

## 2019-09-30 NOTE — Anesthesia Preprocedure Evaluation (Addendum)
Anesthesia Evaluation  Patient identified by MRN, date of birth, ID band Patient awake    Reviewed: Allergy & Precautions, NPO status , Patient's Chart, lab work & pertinent test results  History of Anesthesia Complications Negative for: history of anesthetic complications  Airway Mallampati: II  TM Distance: >3 FB Neck ROM: Full    Dental  (+) Teeth Intact, Dental Advisory Given   Pulmonary COPD,  COPD inhaler,  09/29/2019 SARS coronavirus   breath sounds clear to auscultation       Cardiovascular hypertension, Pt. on medications + Peripheral Vascular Disease  + Valvular Problems/Murmurs AS and MR  Rhythm:Regular + Systolic murmurs '18 ECHO: EF 45-50%, Aortic valve mobility was restricted, mild AS with Peak velocity (S): 238 cm/s. Mean gradient (S): 13 mm Hg. Mod MR, mod TR   Neuro/Psych  Headaches, Anxiety glaucoma    GI/Hepatic GERD  Medicated,  Endo/Other  diabetes, Insulin Dependent  Renal/GU ESRF and DialysisRenal disease (K+ 3.9)     Musculoskeletal  (+) Arthritis , Fibromyalgia -  Abdominal   Peds  Hematology   Anesthesia Other Findings H/o breast cancer  Reproductive/Obstetrics                            Anesthesia Physical Anesthesia Plan  ASA: III  Anesthesia Plan: General   Post-op Pain Management:    Induction: Intravenous  PONV Risk Score and Plan: Ondansetron  Airway Management Planned: LMA  Additional Equipment:   Intra-op Plan:   Post-operative Plan:   Informed Consent: I have reviewed the patients History and Physical, chart, labs and discussed the procedure including the risks, benefits and alternatives for the proposed anesthesia with the patient or authorized representative who has indicated his/her understanding and acceptance.     Dental advisory given  Plan Discussed with: CRNA and Anesthesiologist  Anesthesia Plan Comments:          Anesthesia Quick Evaluation

## 2019-09-30 NOTE — TOC Initial Note (Signed)
Transition of Care (TOC) - Initial/Assessment Note  Marvetta Gibbons RN, BSN Transitions of Care Unit 4E- RN Case Manager [phone]   Patient Details  Name: [name] MRN: [id number] Date of Birth: 04/13/1951  Transition of Care [facility]) CM/SW Contact:    Dawayne Patricia, RN Phone Number: 09/30/2019, 3:56 PM  Clinical Narrative:                 Pt s/p I&D with wound VAC placement today, notified by vascular PA- M. Collins that pt will need HHRN and home wound VAC for discharge- with plan d/c in the AM. Pt will go to HD this afternoon post op. Per chart notes pt was set up with [facility] on last admit- call made to [facility] with [facility] to see if pt still active- [facility]irmed [facility] and they can provide needed services for home wound VAC drsg needs- they will plan to do start of care/resumption on 3/15 for wound VAC drsg change. Pt has hospital bed and w/c at home. May need transport home- went home via [place] on last admit.  [employer] home wound VAC form signed and has been faxed in to [facility]- spoke with Olivia Mackie who will proceed with [facility] approval- once approved home VAC to be delivered to the bedside for transition home.   Expected Discharge Plan: [place] Barriers to Discharge: Continued Medical Work up   Patient Goals and CMS Choice    unable to assess- pt in HD following procedure    Expected Discharge Plan and Services Expected Discharge Plan: [place]   Discharge Planning Services: CM Consult Post Acute Care Choice: [place], Resumption of Svcs/PTA Provider Living arrangements for the past 2 months: Single Family Home                 DME Arranged: Vac DME Agency: [employer] Date DME Agency Contacted: 09/30/19 Time DME Agency Contacted: [phone] Representative spoke with at DME Agency: Olivia Mackie HH Arranged: RN [place] Agency: [facility] Date [place]: 09/30/19 Time HH Agency Contacted: [phone] Representative spoke  with at [place]: [place]  Prior Living Arrangements/Services Living arrangements for the past 2 months: [place] with:: Self, Siblings Patient language and need for interpreter reviewed:: Yes        Need for Family Participation in Patient Care: Yes (Comment) Care giver support system in place?: Yes (comment) Current home services: DME, Home RN Criminal Activity/Legal Involvement Pertinent to Current Situation/Hospitalization: No - Comment as needed  Activities of Daily Living Home Assistive Devices/Equipment: Wheelchair, Environmental consultant (specify type), CBG Meter, Grab bars in shower, Shower chair without back ADL Screening (condition at time of admission) Patient's cognitive ability adequate to safely complete daily activities?: Yes Is the patient deaf or have difficulty hearing?: No Does the patient have difficulty seeing, even when wearing glasses/contacts?: Yes Does the patient have difficulty concentrating, remembering, or making decisions?: No Patient able to express need for assistance with ADLs?: Yes Does the patient have difficulty dressing or bathing?: Yes Independently performs ADLs?: No Communication: Independent Dressing (OT): Independent Grooming: Independent Feeding: Independent Bathing: Needs assistance Is this a change from baseline?: Pre-admission baseline Toileting: Needs assistance Is this a change from baseline?: Pre-admission baseline In/Out Bed: Needs assistance Is this a change from baseline?: Pre-admission baseline Walks in Home: Needs assistance Is this a change from baseline?: Pre-admission baseline Does the patient have difficulty walking or climbing stairs?: Yes Weakness of Legs: Both  Weakness of Arms/Hands: Both  Permission Sought/Granted                  Emotional Assessment           Psych Involvement: No (comment)  Admission diagnosis:  PAD (peripheral artery disease) ([place]) [I73.9] Patient Active Problem List    Diagnosis Date Noted  . PAD (peripheral artery disease) ([place]) 09/30/2019  . Gangrene of foot ([place]) 08/30/2019  . Other fatigue 02/28/2019  . Finger pain, left 02/03/2019  . Trigger ring finger of left hand 02/03/2019  . Osteomyelitis, unspecified ([place]) 11/16/2018  . Diabetic retinopathy associated with type 2 diabetes mellitus ([place]) 11/15/2018  . Rheumatoid factor positive 11/15/2018  . Anemia in ESRD (end-stage renal disease) ([place]) 05/06/2018  . Asthma 05/06/2018  . Hx of adenomatous colonic polyps 04/07/2018  . Status post carpal tunnel release 12/10/2017  . Carpal tunnel syndrome, left upper limb 11/10/2017  . Carpal tunnel syndrome, right upper limb 11/10/2017  . Bilateral hand numbness 09/28/2017  . Dependence on renal dialysis ([place]) 09/11/2017  . Abdominal discomfort, epigastric   . Hypertension 07/06/2017  . Chronic combined systolic and diastolic CHF (congestive heart failure) ([place]) 07/06/2017  . Complicated migraine 83/66/2947  . ESRD on dialysis ([place]) 06/04/2017  . Blindness of both eyes 06/04/2017  . Glaucoma 06/04/2017  . Essential hypertension 10/27/2016  . GERD (gastroesophageal reflux disease) 10/27/2016  . Hypercalcemia 05/28/2015  . Abnormal stress test 03/15/2015  . Poor venous access 02/08/2015  . Breast cancer ([place]) 11/02/2014  . Diarrhea, unspecified 11/02/2014  . Fever, unspecified 11/02/2014  . Pain, unspecified 11/02/2014  . Pruritus, unspecified 11/02/2014  . Shortness of breath 07/28/2014  . Acquired absence of eye 03/31/2014  . Coagulation defect, unspecified ([place]) 03/22/2014  . Dysuria 02/14/2014  . Encounter for immunization 01/03/2014  . Iron deficiency anemia, unspecified 10/18/2013  . Unspecified protein-calorie malnutrition ([place]) 09/23/2013  . Complication of vascular dialysis catheter 06/21/2013  . Fibromyalgia 06/21/2013  . Personal history of breast cancer 06/21/2013  . Personal history of other diseases of the musculoskeletal system and  connective tissue 06/21/2013  . Type 2 diabetes mellitus with diabetic polyneuropathy ([place]) 06/21/2013  . Secondary hyperparathyroidism of renal origin ([place]) 06/20/2013  . Unspecified complication of cardiac and vascular prosthetic device, implant and graft, subsequent encounter 06/20/2013  . Type II diabetes mellitus with renal manifestations ([place]) 06/15/2013  . Chronic kidney disease (CKD), stage IV (severe) ([place]) 06/03/2013  . Neuropathy 12/31/2011  . Breast cancer of upper-inner quadrant of left female breast ([place]) 06/27/2011   PCP:  Prince Solian, MD Pharmacy:   [facility] [place], [place] - [address] AT [address] [place] [address] Phone: [phone] Fax: [phone]     Social Determinants of Health (SDOH) Interventions    Readmission Risk Interventions No flowsheet data found.

## 2019-09-30 NOTE — Op Note (Signed)
Procedure: Incision and drainage of left below-knee amputation with debridement of skin and subcutaneous tissue, placement of VAC  Preoperative diagnosis: Nonhealing BKA left leg  Postoperative diagnosis: Same  Anesthesia: General  Assistant: Nurse  Operative findings: 1.  Small hematoma  2.  Nonviable skin edges and subcutaneous tissue debrided back to healthy tissue  Operative details: After team informed consent, the patient taken the operating.  The patient placed supine position operating table.  After induction of anesthesia and endotracheal ovation patient's left lower extremities prepped and draped in usual sterile fashion.  The patient had had dehiscence of the fascial and subcutaneous edges.  The below-knee amputation sutures were taken out the subcutaneous and fascial layer.  There was a small hematoma of about 30 cc.  This was evacuated.  The wound was thoroughly irrigated normal saline solution.  The muscle appeared viable and contractile with cautery stimulation.  However it was dark and stained from blood.  I then proceeded to debride back the skin and subcutaneous tissues back to healthy bleeding tissue.  Size of the wound at the end of debridement was about 8 x 4 cm to centimeter depth.  Hemostasis was obtained.  A wound VAC was applied and placed on 125 mm suction.  The patient tired procedure well and there were no complications.  The instrument sponge needle counts correct in the case.  Patient taken to recovery in stable condition.  Ruta Hinds, MD [facility] [place] Office: [phone]

## 2019-09-30 NOTE — Consult Note (Addendum)
[name] Renal Consultation Note    Indication for Consultation:  Management of ESRD/hemodialysis; anemia, hypertension/volume and secondary hyperparathyroidism PCP:  HPI: [name] is a 69 y.o. female with ESRD on hemodialysis MWF at [facility]. PMH: DMT2, HTN, PAD, aortic stenosis, mitral reguglacoma, patient is legally blind, AOCD, SHPT. She was admitted today for revision of non-healing L BKA. No previous issues prior to admission. Seen post op, no specific complaints, concerned she may need bilateral BKAs in future. HD today on schedule.   Past Medical History:  Diagnosis Date  . Anemia   . Anxiety   . Arthritis    "joints" (06/15/2013)  . Asthma   . 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 Blood transfusion without reported diagnosis   . Breast cancer ([place])    left  . CHF (congestive heart failure) ([place])   . 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 Esophageal ulcer with bleeding   . ESRD (end stage renal disease) ([place])    "suppose to start dialysis today" (06/15/2013)  . Family history of adverse reaction to anesthesia    It took a while for pt sister to wake from anesthesia  . GERD (gastroesophageal reflux disease)   . Glaucoma    blind in both eyes  . Heart murmur    Mild AS, moderate MR, moderate TR 10/30/16 echo  . Hx of adenomatous colonic polyps 04/07/2018  . Hypertension   . Myalgia 12/31/2011  . Neuropathy 12/31/2011  . PAD (peripheral artery disease) (HCC)    nonviable tissue left lower extremity  . Shortness of breath    "when she doesn't go to dialysis"  . Type II diabetes mellitus (HCC)    Type II  . Walker as ambulation aid    Past Surgical History:  Procedure Laterality Date  . A/V FISTULAGRAM N/A 09/21/2018   Procedure: A/V FISTULAGRAM - Right Upper;  Surgeon: [name], MD;  Location: [place] [facility];  Service: Cardiovascular;  Laterality: N/A;  . ABDOMINAL AORTOGRAM N/A  11/30/2018   Procedure: ABDOMINAL AORTOGRAM;  Surgeon: [name], MD;  Location: [place] [facility];  Service: Cardiovascular;  Laterality: N/A;  . ABDOMINAL AORTOGRAM W/LOWER EXTREMITY Left 07/29/2019   Procedure: ABDOMINAL AORTOGRAM W/LOWER EXTREMITY;  Surgeon: [name], MD;  Location: [place] [facility];  Service: Cardiovascular;  Laterality: Left;  . ABDOMINAL HYSTERECTOMY     partial  . AMPUTATION Left 08/30/2019   Procedure: AMPUTATION BELOW KNEE LEFT;  Surgeon: [name], MD;  [facility];  Service: Vascular;  Laterality: Left;  . AV FISTULA PLACEMENT Right 06/06/2013   Procedure: ARTERIOVENOUS (AV) FISTULA CREATION-RIGHT BRACHIAL CEPHALIC;  Surgeon: [name], MD;  Location: [place];  Service: Vascular;  Laterality: Right;  . BREAST BIOPSY Left   . BREAST LUMPECTOMY Left    "and took out some lymph nodes" (06/15/2013)  . CARDIAC CATHETERIZATION     04/02/15 [facility]): no angiographic CAD, LVEF 40% with global hypokinesis (LHC done for + stress echo, EF 45% 11/28/14)  . CARPAL TUNNEL RELEASE Left 11/26/2017   Procedure: LEFT CARPAL TUNNEL RELEASE;  Surgeon: [name], MD;  Location: [place];  Service: Orthopedics;  Laterality: Left;  . CATARACT EXTRACTION W/ ANTERIOR VITRECTOMY Bilateral   . CESAREAN SECTION  1980  . COLONOSCOPY    . ESOPHAGOGASTRODUODENOSCOPY N/A 07/08/2017   Procedure: ESOPHAGOGASTRODUODENOSCOPY (EGD);  Surgeon: [name], MD;  [facility]: [facility];  Service: Endoscopy;  Laterality: N/A;  . ESOPHAGOGASTRODUODENOSCOPY (EGD) WITH PROPOFOL N/A 02/07/2018   Procedure: ESOPHAGOGASTRODUODENOSCOPY (EGD) WITH PROPOFOL;  Surgeon: [name], MD;  Location: [facility]COPY;  Service: Gastroenterology;  Laterality: N/A;  APC and clips placed  . EYE SURGERY Bilateral    laser surgery  . LOWER EXTREMITY ANGIOGRAPHY Bilateral 11/30/2018   Procedure: LOWER EXTREMITY ANGIOGRAPHY;  Surgeon: [name], MD;  Location: [place] [facility];   Service: Cardiovascular;  Laterality: Bilateral;  . PARS PLANA VITRECTOMY Right 05/05/2017   Procedure: PARS PLANA VITRECTOMY WITH 25 GAUGE; PARTIAL REMOVAL OF OIL; INFERIOR PERIPHERAL IRIDECTOMY, REFORM ANTERIOR CHAMBER RIGHT EYE;  Surgeon: [name], MD;  Location: [place];  Service: Ophthalmology;  Laterality: Right;  . PERIPHERAL VASCULAR BALLOON ANGIOPLASTY Right 09/21/2018   Procedure: PERIPHERAL VASCULAR BALLOON ANGIOPLASTY;  Surgeon: [name], MD;  Location: [place] [facility];  Service: Cardiovascular;  Laterality: Right;  AV fistula  . PERIPHERAL VASCULAR BALLOON ANGIOPLASTY Left 11/30/2018   Procedure: PERIPHERAL VASCULAR BALLOON ANGIOPLASTY;  Surgeon: [name], MD;  Location: [place] [facility];  Service: Cardiovascular;  Laterality: Left;  Left Anterior Tibial Artery  . REFRACTIVE SURGERY Bilateral   . REMOVAL OF A DIALYSIS CATHETER Right 06/06/2013   Procedure: REMOVAL OF RIGHT MEDIPORT;  Surgeon: [name], MD;  Location: [place];  Service: Vascular;  Laterality: Right;  . TONSILLECTOMY    . UPPER GASTROINTESTINAL ENDOSCOPY     Family History  Problem Relation Age of Onset  . Diabetes Mother   . Hyperlipidemia Mother   . Hypertension Mother   . Hypertension Father   . Diabetes Sister   . Diabetes Brother   . Hypertension Brother   . Heart attack Brother   . Kidney disease Brother   . Colon cancer Neg Hx   . Colon polyps Neg Hx   . Esophageal cancer Neg Hx   . Gallbladder disease Neg Hx   . Rectal cancer Neg Hx   . Stomach cancer Neg Hx    Social History:  reports that she has never smoked. She has never used smokeless tobacco. She reports that she does not drink alcohol or use drugs. Allergies  Allergen Reactions  . Latex Hives  . Oxycodone Other (See Comments)    Hallucinations   . Tramadol Other (See Comments)    hallucinations   . Tape Rash    4M Transpore adhesive tape. Medical tape  . Vicodin [Hydrocodone-Acetaminophen] Other (See  Comments)    hallucinations   Prior to Admission medications   Medication Sig Start Date End Date Taking? Authorizing Provider  acetaminophen (TYLENOL) 500 MG tablet Take 1,000 mg by mouth every 6 (six) hours as needed for mild pain or moderate pain.    Yes [provider]  albuterol (PROVENTIL) (2.5 MG/4ML) 0.083% nebulizer solution Take 3 mLs (2.5 mg total) by nebulization every 6 (six) hours as needed for wheezing or shortness of breath. 10/30/16  Yes Theodis Blaze, MD  albuterol (VENTOLIN HFA) 108 (90 Base) MCG/ACT inhaler Inhale into the lungs every 6 (six) hours as needed for wheezing or shortness of breath.   Yes [provider]  Alcohol Swabs (B-D SINGLE USE SWABS REGULAR) PADS USE UTD FOR INSULIN INJECTIONS TID 03/08/19  Yes [provider]  amLODipine (NORVASC) 5 MG tablet Take 5 mg by mouth daily as needed (low blood pressure).  05/21/18  Yes [provider]  aspirin EC 81 MG  tablet Take 81 mg by mouth daily.   Yes [provider]  atropine 1 % ophthalmic solution Place 1 drop into the right eye 2 (two) times daily. 07/04/17  Yes [provider]  B Complex-C-Folic Acid (RENA-VITE RX) 1 MG TABS Take 1 tablet by mouth in the morning and at bedtime.  09/29/16  Yes [provider]  BD PEN NEEDLE NANO 2ND GEN 32G X 4 MM MISC USE WITH INSULIN PEN 3 TIMES A DAY 03/03/19  Yes [provider]  carvedilol (COREG) 25 MG tablet Take 25 mg by mouth 2 (two) times daily. 04/03/18  Yes [provider]  clopidogrel (PLAVIX) 75 MG tablet Take 1 tablet (75 mg total) by mouth daily. 11/30/18  Yes Fields, Jessy Oto, MD  darbepoetin (ARANESP) 200 MCG/0.4ML SOLN injection Inject 0.4 mLs (200 mcg total) into the vein every Wednesday with hemodialysis. 06/22/13  Yes Eulogio Bear U, DO  diclofenac sodium (VOLTAREN) 1 % GEL Apply 1 application topically 4 (four) times daily as needed (pain.).  04/28/19  Yes [provider]  docusate  sodium (COLACE) 100 MG capsule Take 100 mg by mouth daily as needed for mild constipation.    Yes [provider]  doxercalciferol (HECTOROL) 4 MCG/2ML injection Inject 0.5 mLs (1 mcg total) into the vein every Monday, Wednesday, and Friday with hemodialysis. 06/18/13  Yes Vann, Jessica U, DO  fluticasone (FLONASE) 50 MCG/ACT nasal spray Place 1-2 sprays into both nostrils daily as needed for allergies.  02/07/19  Yes [provider]  gabapentin (NEURONTIN) 100 MG capsule Take 100 mg by mouth 2 (two) times daily.   Yes [provider]  insulin aspart (NOVOLOG FLEXPEN) 100 UNIT/ML FlexPen Inject 6-10 Units into the skin 3 (three) times daily with meals. per Sliding scale   Yes [provider]  lanthanum (FOSRENOL) 1000 MG chewable tablet Chew 1,000-3,000 mg by mouth See admin instructions. Take 4000 mg)by mouth with each meal & take 1000 mg by mouth with each snack   Yes [provider]  lisinopril (PRINIVIL,ZESTRIL) 40 MG tablet Take 40 mg by mouth at bedtime.    Yes [provider]  mupirocin ointment (BACTROBAN) 2 % Apply to affected area right foot once daily Patient taking differently: Apply 1 application topically daily. Apply to affected area right foot once daily 08/16/19  Yes Galaway, Stephani Police, DPM  omeprazole (PRILOSEC) 40 MG capsule Take 1 capsule (40 mg total) by mouth daily. Patient taking differently: Take 40 mg by mouth 2 (two) times daily.  02/09/18  Yes Cherene Altes, MD  [facility] VERIO test strip 3 (three) times daily. for testing 12/15/17  Yes [provider]  prednisoLONE acetate (PRED FORTE) 1 % ophthalmic suspension Place 1 drop into the right eye 2 (two) times daily.  04/27/17  Yes [provider]  traMADol (ULTRAM) 50 MG tablet TAKE 1 TABLET(50 MG) BY MOUTH EVERY 6 HOURS AS NEEDED FOR MODERATE PAIN Patient taking differently: Take 50 mg by mouth every 6 (six) hours as needed for moderate pain.  09/26/19  Yes  Angelia Mould, MD   Current Facility-Administered Medications  Medication Dose Route Frequency Provider Last Rate Last Admin  . 0.9 %  sodium chloride infusion  250 mL Intravenous PRN [name], MD      . acetaminophen (TYLENOL) 325 MG tablet           . acetaminophen (TYLENOL) tablet 325-650 mg  325-650 mg Oral Q4H PRN Ruta Hinds  E, MD   650 mg at 09/30/19 1312   Or  . acetaminophen (TYLENOL) suppository 325-650 mg  325-650 mg Rectal Q4H PRN [name], MD      . albuterol (PROVENTIL) (2.5 MG/3ML) 0.083% nebulizer solution 2.5 mg  2.5 mg Nebulization Q6H PRN [name], MD      . alum & mag hydroxide-simeth (MAALOX/MYLANTA) 200-200-20 MG/5ML suspension 15-30 mL  15-30 mL Oral Q2H PRN [name], MD      . Derrill Memo ON 10/01/2019] aspirin EC tablet 81 mg  81 mg Oral Daily Fields, Charles E, MD      . atropine 1 % ophthalmic solution 1 drop  1 drop Right Eye BID [name], MD      . carvedilol (COREG) tablet 25 mg  25 mg Oral BID [name], MD      . Derrill Memo ON 10/01/2019] Chlorhexidine Gluconate Cloth 2 % PADS 6 each  6 each Topical Q0600 Valentina Gu, NP      . clopidogrel (PLAVIX) tablet 75 mg  75 mg Oral Daily Fields, Charles E, MD      . docusate sodium (COLACE) capsule 100 mg  100 mg Oral BID [name], MD      . Derrill Memo ON 10/01/2019] enoxaparin (LOVENOX) injection 30 mg  30 mg Subcutaneous Q24H Carney, Gay Filler, RPH      . fentaNYL (SUBLIMAZE) 100 MCG/2ML injection           . gabapentin (NEURONTIN) capsule 100 mg  100 mg Oral BID [name], MD      . guaiFENesin-dextromethorphan (ROBITUSSIN DM) 100-10 MG/5ML syrup 15 mL  15 mL Oral Q4H PRN [name], MD      . hydrALAZINE (APRESOLINE) injection 5 mg  5 mg Intravenous Q20 Min PRN [name], MD      . insulin aspart (novoLOG) injection 0-9 Units  0-9 Units Subcutaneous TID WC Collins, Emma M, PA-C      . labetalol (NORMODYNE) injection 10 mg  10  mg Intravenous Q10 min PRN [name], MD      . lisinopril (ZESTRIL) tablet 40 mg  40 mg Oral QHS Fields, Jessy Oto, MD      . metoprolol tartrate (LOPRESSOR) injection 2-5 mg  2-5 mg Intravenous Q2H PRN [name], MD      . morphine 2 MG/ML injection 2-5 mg  2-5 mg Intravenous Q1H PRN [name], MD      . ondansetron (ZOFRAN) injection 4 mg  4 mg Intravenous Q6H PRN [name], MD      . pantoprazole (PROTONIX) EC tablet 40 mg  40 mg Oral Daily Fields, Charles E, MD      . phenol (CHLORASEPTIC) mouth spray 1 spray  1 spray Mouth/Throat PRN [name], MD      . prednisoLONE acetate (PRED FORTE) 1 % ophthalmic suspension 1 drop  1 drop Right Eye BID Fields, Juanda Crumble E, MD      . sodium chloride flush (NS) 0.9 % injection 3 mL  3 mL Intravenous Q12H Fields, Charles E, MD      . sodium chloride flush (NS) 0.9 % injection 3 mL  3 mL Intravenous PRN [name], MD       Labs: Basic Metabolic Panel: Recent Labs  Lab 09/30/19 0837 09/30/19 1214  NA 135  --   K 3.9  --   CL 98  --  GLUCOSE 103*  --   BUN 29*  --   CREATININE 7.90* 7.29*   Liver Function Tests: No results for input(s): AST, ALT, ALKPHOS, BILITOT, PROT, ALBUMIN in the last 168 hours. No results for input(s): LIPASE, AMYLASE in the last 168 hours. No results for input(s): AMMONIA in the last 168 hours. CBC: Recent Labs  Lab 09/30/19 0837 09/30/19 1214  WBC  --  6.0  HGB 10.9* 8.7*  HCT 32.0* 27.4*  MCV  --  96.1  PLT  --  256   Cardiac Enzymes: No results for input(s): CKTOTAL, CKMB, CKMBINDEX, TROPONINI in the last 168 hours. CBG: Recent Labs  Lab 09/30/19 0750 09/30/19 1137  GLUCAP 97 115*   Iron Studies: No results for input(s): IRON, TIBC, TRANSFERRIN, FERRITIN in the last 72 hours. Studies/Results: No results found.  ROS: As per HPI otherwise negative.    Physical Exam: Vitals:   09/30/19 1334 09/30/19 1405 09/30/19 1408 09/30/19 1424  BP: (!) 140/58  (!) 140/53  (!) 147/65  Pulse: 63 63    Resp: 13 14    Temp:   97.8 F (36.6 C) 97.6 F (36.4 C)  TempSrc:    Oral  SpO2: 100% 100%    Weight:      Height:         General: Chronically ill appearing obese female in no acute distress. Head: Normocephalic, atraumatic, sclera non-icteric, mucus membranes are moist Neck: Supple. JVD not elevated. Lungs: Clear bilaterally to auscultation without wheezes, rales, or rhonchi. Breathing is unlabored. Heart: SR, K3,T4 2/6 systolic M.  Abdomen: Soft, non-tender, non-distended with normoactive bowel sounds. No rebound/guarding. No obvious abdominal masses. M-S:  Strength and tone appear normal for age. Lower extremities:L BKA wound vac in place, serous drainage. 1+RLE edema. Dry gangrene noted R heel.  Neuro: Alert and oriented X 3. Moves all extremities spontaneously. Psych:  Responds to questions appropriately with a normal affect. Dialysis Access: R AVF + bruit  Dialysis Orders: [facility] MWF 4 hrs 180NRe 400/Autoflow 1.5 89 kg 2.0 K/ 2.25 Ca UFP 2 R AVF -No heparin -Mircera 100 mcg IV q 2 weeks (last dose 75 mcg IV 09/26/2019) -Hectorol 2 mcg IV TIW   Assessment/Plan: 1.  Nonhealing L BKA/SP revision of L BKA: Per Dr. Oneida Alar. Wound vac in place 2.  PAD-Ulcer L heel. Per primary. Following with [facility].  3.  ESRD - MWF. HD today on schedule. No heparin. K+3.9. Use 4.0 K bath.  4.  Hypertension/volume  - BP well controlled. Has trace-1+ edema RLE. Left 0.6 kg above OP EDW last treatment. Try to push to OP EDW 89 kg.  5.  Anemia  -HGB 10.9 but this is an H & H which is usually higher than a CBC. Repeat HGB 8.7. Recent ESA dose. Follow HGB. Transfuse as needed.  6.  Metabolic bone disease - Continue binders, VDRA 7.  Nutrition -Renal/carb mod diet. Add nepro/renal vit. 8.  DM-per primary  Jimmye Norman. Owens Shark, NP-C 09/30/2019, 2:57 PM  [employer] [phone]    Pt seen, examined and agree w A/P as above.  Kelly Splinter   MD 09/30/2019, 5:05 PM

## 2019-09-30 NOTE — Anesthesia Procedure Notes (Signed)
Procedure Name: LMA Insertion Date/Time: 09/30/2019 10:55 AM Performed by: Kathryne Hitch, CRNA Pre-anesthesia Checklist: Patient identified, Emergency Drugs available, Suction available and Patient being monitored Patient Re-evaluated:Patient Re-evaluated prior to induction Oxygen Delivery Method: Circle system utilized Preoxygenation: Pre-oxygenation with 100% oxygen Induction Type: IV induction Ventilation: Mask ventilation without difficulty LMA: LMA inserted LMA Size: 5.0 Placement Confirmation: breath sounds checked- equal and bilateral Tube secured with: Tape Dental Injury: Teeth and Oropharynx as per pre-operative assessment

## 2019-09-30 NOTE — Transfer of Care (Signed)
Immediate Anesthesia Transfer of Care Note  Patient: [name]  Procedure(s) Performed: IRRIGATION AND DEBRIDEMENT WOUND OF LEFT BELOW KNEE AMPUTATION (Left Leg Lower)  Patient Location: PACU  Anesthesia Type:General  Level of Consciousness: awake and patient cooperative  Airway & Oxygen Therapy: Patient Spontanous Breathing and Patient connected to face mask oxygen  Post-op Assessment: Report given to RN and Post -op Vital signs reviewed and stable  Post vital signs: Reviewed and stable  Last Vitals:  Vitals Value Taken Time  BP 116/32 09/30/19 1135  Temp    Pulse 67 09/30/19 1136  Resp 13 09/30/19 1136  SpO2 100 % 09/30/19 1136  Vitals shown include unvalidated device data.  Last Pain:  Vitals:   09/30/19 0816  PainSc: 10-Worst pain ever      Patients Stated Pain Goal: 4 (74/93/55 2174)  Complications: No apparent anesthesia complications

## 2019-09-30 NOTE — Anesthesia Postprocedure Evaluation (Signed)
Anesthesia Post Note  Patient: MARY-ANNE POLIZZI  Procedure(s) Performed: IRRIGATION AND DEBRIDEMENT WOUND OF LEFT BELOW KNEE AMPUTATION (Left Leg Lower)     Patient location during evaluation: PACU Anesthesia Type: General Level of consciousness: awake and alert Pain management: pain level controlled Vital Signs Assessment: post-procedure vital signs reviewed and stable Respiratory status: spontaneous breathing, nonlabored ventilation, respiratory function stable and patient connected to nasal cannula oxygen Cardiovascular status: blood pressure returned to baseline and stable Postop Assessment: no apparent nausea or vomiting Anesthetic complications: no    Last Vitals:  Vitals:   09/30/19 1408 09/30/19 1424  BP:  (!) 147/65  Pulse:    Resp:    Temp: 36.6 C 36.4 C  SpO2:      Last Pain:  Vitals:   09/30/19 1424  TempSrc: Oral  PainSc:                  Dakotah Orrego,W. EDMOND

## 2019-09-30 NOTE — Interval H&P Note (Signed)
History and Physical Interval Note:  09/30/2019 10:22 AM  [name]  has presented today for surgery, with the diagnosis of NON HEALING WOUND ON LEFT BELOW KNEE AMPUTATION.  The various methods of treatment have been discussed with the patient and family. After consideration of risks, benefits and other options for treatment, the patient has consented to  Procedure(s): IRRIGATION AND DEBRIDEMENT WOUND OF LEFT BELOW KNEE AMPUTATION (Left) as a surgical intervention.  The patient's history has been reviewed, patient examined, no change in status, stable for surgery.  I have reviewed the patient's chart and labs.  Questions were answered to the patient's satisfaction.     Ruta Hinds

## 2019-09-30 NOTE — Progress Notes (Signed)
Patient off unit to dialysis at this time.  

## 2019-10-01 DIAGNOSIS — E1122 Type 2 diabetes mellitus with diabetic chronic kidney disease: Secondary | ICD-10-CM | POA: Diagnosis not present

## 2019-10-01 DIAGNOSIS — D631 Anemia in chronic kidney disease: Secondary | ICD-10-CM | POA: Diagnosis not present

## 2019-10-01 DIAGNOSIS — E1129 Type 2 diabetes mellitus with other diabetic kidney complication: Secondary | ICD-10-CM | POA: Diagnosis not present

## 2019-10-01 DIAGNOSIS — T8781 Dehiscence of amputation stump: Secondary | ICD-10-CM | POA: Diagnosis not present

## 2019-10-01 DIAGNOSIS — N186 End stage renal disease: Secondary | ICD-10-CM | POA: Diagnosis not present

## 2019-10-01 DIAGNOSIS — I739 Peripheral vascular disease, unspecified: Secondary | ICD-10-CM | POA: Diagnosis not present

## 2019-10-01 DIAGNOSIS — N250 Renal osteodystrophy: Secondary | ICD-10-CM | POA: Diagnosis not present

## 2019-10-01 DIAGNOSIS — E8770 Fluid overload, unspecified: Secondary | ICD-10-CM | POA: Diagnosis not present

## 2019-10-01 DIAGNOSIS — Z89512 Acquired absence of left leg below knee: Secondary | ICD-10-CM | POA: Diagnosis not present

## 2019-10-01 DIAGNOSIS — I96 Gangrene, not elsewhere classified: Secondary | ICD-10-CM | POA: Diagnosis not present

## 2019-10-01 DIAGNOSIS — I132 Hypertensive heart and chronic kidney disease with heart failure and with stage 5 chronic kidney disease, or end stage renal disease: Secondary | ICD-10-CM | POA: Diagnosis not present

## 2019-10-01 DIAGNOSIS — E1151 Type 2 diabetes mellitus with diabetic peripheral angiopathy without gangrene: Secondary | ICD-10-CM | POA: Diagnosis not present

## 2019-10-01 DIAGNOSIS — Z992 Dependence on renal dialysis: Secondary | ICD-10-CM | POA: Diagnosis not present

## 2019-10-01 DIAGNOSIS — M96840 Postprocedural hematoma of a musculoskeletal structure following a musculoskeletal system procedure: Secondary | ICD-10-CM | POA: Diagnosis not present

## 2019-10-01 DIAGNOSIS — T8789 Other complications of amputation stump: Secondary | ICD-10-CM | POA: Diagnosis not present

## 2019-10-01 LAB — GLUCOSE, CAPILLARY
Glucose-Capillary: 120 mg/dL — ABNORMAL HIGH (ref 70–99)
Glucose-Capillary: 158 mg/dL — ABNORMAL HIGH (ref 70–99)
Glucose-Capillary: 154 mg/dL — ABNORMAL HIGH (ref 70–99)
Glucose-Capillary: 145 mg/dL — ABNORMAL HIGH (ref 70–99)

## 2019-10-01 LAB — BASIC METABOLIC PANEL
Anion gap: 13 (ref 5–15)
BUN: 24 mg/dL — ABNORMAL HIGH (ref 8–23)
CO2: 29 mmol/L (ref 22–32)
Calcium: 8.8 mg/dL — ABNORMAL LOW (ref 8.9–10.3)
Chloride: 93 mmol/L — ABNORMAL LOW (ref 98–111)
Creatinine, Ser: 6.08 mg/dL — ABNORMAL HIGH (ref 0.44–1.00)
GFR calc Af Amer: 8 mL/min — ABNORMAL LOW (ref >60)
GFR calc non Af Amer: 7 mL/min — ABNORMAL LOW (ref >60)
Glucose, Bld: 189 mg/dL — ABNORMAL HIGH (ref 70–99)
Potassium: 4.6 mmol/L (ref 3.5–5.1)
Sodium: 135 mmol/L (ref 135–145)

## 2019-10-01 MED ORDER — TRAMADOL HCL 50 MG PO TABS: 50.0000 mg | ORAL_TABLET | Freq: Four times a day (QID) | ORAL | 0 refills | Status: DC | PRN

## 2019-10-01 NOTE — Plan of Care (Signed)
  Problem: Education: Goal: Knowledge of General Education information will improve Description: Including pain rating scale, medication(s)/side effects and non-pharmacologic comfort measures Outcome: Progressing   Problem: Health Behavior/Discharge Planning: Goal: Ability to manage health-related needs will improve Outcome: Progressing   Problem: Clinical Measurements: Goal: Ability to maintain clinical measurements within normal limits will improve Outcome: Progressing Goal: Will remain free from infection Outcome: Progressing Goal: Diagnostic test results will improve Outcome: Progressing Goal: Respiratory complications will improve Outcome: Progressing Goal: Cardiovascular complication will be avoided Outcome: Progressing   Problem: Activity: Goal: Risk for activity intolerance will decrease Outcome: Progressing   Problem: Nutrition: Goal: Adequate nutrition will be maintained Outcome: Progressing   Problem: Coping: Goal: Level of anxiety will decrease Outcome: Progressing   Problem: Activity: Goal: Risk for activity intolerance will decrease Outcome: Progressing   Problem: Nutrition: Goal: Adequate nutrition will be maintained Outcome: Progressing   Problem: Pain Managment: Goal: General experience of comfort will improve Outcome: Progressing   Problem: Safety: Goal: Ability to remain free from injury will improve Outcome: Progressing   Problem: Skin Integrity: Goal: Risk for impaired skin integrity will decrease Outcome: Progressing

## 2019-10-01 NOTE — Discharge Instructions (Signed)
HH wound vac changes 3 times per week.  Keep wound vac clean and dry between changes.

## 2019-10-01 NOTE — Progress Notes (Signed)
[facility] Progress Note   Subjective: Seen in room, hoping to go home. DC orders noted. No C/Os. No new issues overnight.   Objective Vitals:   09/30/19 1913 09/30/19 2123 10/01/19 0301 10/01/19 0800  BP: (!) 152/42 (!) 117/46 (!) 132/49 (!) 96/51  Pulse: 74 73 71 66  Resp: 18 13 16 15   Temp: 98 F (36.7 C)  98 F (36.7 C) 98.2 F (36.8 C)  TempSrc: Oral  Oral Oral  SpO2:  100% 100% 100%  Weight:      Height:       Physical Exam General: Chronically ill obese female very pleasant, NAD Heart: W1,U2 2/6 systolic M Lungs: CTAB Abdomen: obese, active BS Extremities: Dialysis Access: :L BKA wound vac in place, serous drainage.  No RLE edema. Drsg noted R heel.    Additional Objective Labs: Basic Metabolic Panel: Recent Labs  Lab 09/30/19 0837 09/30/19 1214  NA 135  --   K 3.9  --   CL 98  --   GLUCOSE 103*  --   BUN 29*  --   CREATININE 7.90* 7.29*   Liver Function Tests: No results for input(s): AST, ALT, ALKPHOS, BILITOT, PROT, ALBUMIN in the last 168 hours. No results for input(s): LIPASE, AMYLASE in the last 168 hours. CBC: Recent Labs  Lab 09/30/19 0837 09/30/19 1214  WBC  --  6.0  HGB 10.9* 8.7*  HCT 32.0* 27.4*  MCV  --  96.1  PLT  --  256   Blood Culture    Component Value Date/Time   SDES SPUTUM 05/07/2018 0818   SDES URINE, RANDOM 05/07/2018 0818   SPECREQUEST NONE 05/07/2018 0818   SPECREQUEST NONE 05/07/2018 0818   CULT  05/06/2018 2247    NO GROWTH 5 DAYS Performed at [facility], [place] [address], [place], [place] [address]    REPTSTATUS 05/07/2018 FINAL 05/07/2018 0818   REPTSTATUS 05/07/2018 FINAL 05/07/2018 0818    Cardiac Enzymes: No results for input(s): CKTOTAL, CKMB, CKMBINDEX, TROPONINI in the last 168 hours. CBG: Recent Labs  Lab 09/30/19 0750 09/30/19 1137 09/30/19 2011 10/01/19 0643  GLUCAP 97 115* 125* 120*   Iron Studies: No results for input(s): IRON, TIBC, TRANSFERRIN, FERRITIN in the  last 72 hours. @lablastinr3 @ Studies/Results: No results found. Medications: . sodium chloride     . aspirin EC  81 mg Oral Daily  . atropine  1 drop Right Eye BID  . carvedilol  25 mg Oral BID  . Chlorhexidine Gluconate Cloth  6 each Topical Q0600  . clopidogrel  75 mg Oral Daily  . docusate sodium  100 mg Oral BID  . [START ON 10/03/2019] doxercalciferol  2 mcg Intravenous Q M,W,F-HD  . enoxaparin (LOVENOX) injection  30 mg Subcutaneous Q24H  . feeding supplement (NEPRO CARB STEADY)  237 mL Oral BID BM  . gabapentin  100 mg Oral BID  . insulin aspart  0-9 Units Subcutaneous TID WC  . lanthanum  3,000 mg Oral TID WC  . lisinopril  40 mg Oral QHS  . multivitamin  1 tablet Oral QHS  . pantoprazole  40 mg Oral Daily  . prednisoLONE acetate  1 drop Right Eye BID  . sodium chloride flush  3 mL Intravenous Q12H     Dialysis Orders: [place] MWF 4 hrs 180NRe 400/Autoflow 1.5 89 kg 2.0 K/ 2.25 Ca UFP 2 R AVF -No heparin -Mircera 100 mcg IV q 2 weeks (last dose 75 mcg IV 09/26/2019) -Hectorol 2  mcg IV TIW   Assessment/Plan: 1.  Nonhealing L BKA/SP revision of L BKA: Per Dr. Oneida Alar. Wound vac in place 2.  PAD-Ulcer L heel. Per primary. Following with [facility].  3.  ESRD - MWF. HD today on schedule. No heparin. K+3.9. Use 4.0 K bath.  4.  Hypertension/volume  - BP well controlled. HD 03/12 Net UF not documented. Not post wt. Volume status appears stable.  5.  Anemia  -HGB 10.9 but this is an H & H which is usually higher than a CBC. Repeat HGB 8.7. Recent ESA dose. Follow HGB. Transfuse as needed.  6.  Metabolic bone disease - Continue binders, VDRA 7.  Nutrition -Renal/carb mod diet. Add nepro/renal vit. 8.  DM-per primary  Disposition: Home today with HH.   Cacey Willow H. Kyliana Standen NP-C 10/01/2019, 10:00 AM  [employer] [phone]

## 2019-10-01 NOTE — Care Management (Signed)
Per RN, [employer] wound vac has not been delivered.  [employer] rep, Olivia Mackie, checked with courier and the vac is "enroute" but she does not know ETA.  RN advised.  It is still anticipated that vac will arrive today for patient dc.

## 2019-10-01 NOTE — Progress Notes (Signed)
Patient back from Dialysis.Wound vac. Beeping blockage. Change canister and machine. Still beeping blockage. R.N. into assess. Dressing was change and found large clot at the area of suctioning. That was blocking the vac. From not working. New dressing applied by [facility]. Wound vac working now.

## 2019-10-01 NOTE — Progress Notes (Addendum)
[facility] [place]  Subjective  - Doing well with the wound vac.   Objective (!) 96/51 66 98.2 F (36.8 C) (Oral) 15 100%  Intake/Output Summary (Last 24 hours) at 10/01/2019 0847 Last data filed at 10/01/2019 0800 Gross per 24 hour  Intake 970 ml  Output 350 ml  Net 620 ml    Left BKA wound vac to suction.  300 cc SS bloody drainage Right foot with protective dressing over heel dry gangrene and bunion callus stable. Lungs non labored breathing Heart RRR   Assessment/Planning: POD #1 irrigations and debridement of left BKA non healing incision and wound vac placement. Afebrile and WBC WNL.  No signs or symptoms of infection.  Disposition stable.  She received HD yesterday and will be discharged today with [facility] RN and wound vac to be changed 3 times per week. F/U with Dr. Oneida Alar in 2 weeks for wound check.  Roxy Horseman 10/01/2019 8:47 AM --  Laboratory Lab Results: Recent Labs    09/30/19 0837 09/30/19 1214  WBC  --  6.0  HGB 10.9* 8.7*  HCT 32.0* 27.4*  PLT  --  256   BMET Recent Labs    09/30/19 0837 09/30/19 1214  NA 135  --   K 3.9  --   CL 98  --   GLUCOSE 103*  --   BUN 29*  --   CREATININE 7.90* 7.29*    COAG Lab Results  Component Value Date   INR 1.16 05/07/2018   INR 1.13 02/06/2018   INR 1.03 06/17/2013   No results found for: PTT  I agree with the above.  She is stable for discharge today with wound VAC.  She will follow-up with Dr. Oneida Alar in a few weeks  Annamarie Major

## 2019-10-01 NOTE — Plan of Care (Signed)

## 2019-10-01 NOTE — Progress Notes (Signed)
Patient in a stable condition, discharge education reviewed with  pt and her sister at bedside, they verbalized understanding, patient belongings at bedside, iv removed, tele dc,  ccmd notified, patient to be transported home by her family

## 2019-10-03 DIAGNOSIS — S81802A Unspecified open wound, left lower leg, initial encounter: Secondary | ICD-10-CM | POA: Diagnosis not present

## 2019-10-03 DIAGNOSIS — N2581 Secondary hyperparathyroidism of renal origin: Secondary | ICD-10-CM | POA: Diagnosis not present

## 2019-10-03 DIAGNOSIS — Z992 Dependence on renal dialysis: Secondary | ICD-10-CM | POA: Diagnosis not present

## 2019-10-03 DIAGNOSIS — N186 End stage renal disease: Secondary | ICD-10-CM | POA: Diagnosis not present

## 2019-10-03 DIAGNOSIS — D509 Iron deficiency anemia, unspecified: Secondary | ICD-10-CM | POA: Diagnosis not present

## 2019-10-03 DIAGNOSIS — D631 Anemia in chronic kidney disease: Secondary | ICD-10-CM | POA: Diagnosis not present

## 2019-10-04 DIAGNOSIS — Z4781 Encounter for orthopedic aftercare following surgical amputation: Secondary | ICD-10-CM | POA: Diagnosis not present

## 2019-10-05 DIAGNOSIS — Z992 Dependence on renal dialysis: Secondary | ICD-10-CM | POA: Diagnosis not present

## 2019-10-05 DIAGNOSIS — N186 End stage renal disease: Secondary | ICD-10-CM | POA: Diagnosis not present

## 2019-10-05 DIAGNOSIS — D509 Iron deficiency anemia, unspecified: Secondary | ICD-10-CM | POA: Diagnosis not present

## 2019-10-05 DIAGNOSIS — D631 Anemia in chronic kidney disease: Secondary | ICD-10-CM | POA: Diagnosis not present

## 2019-10-05 DIAGNOSIS — N2581 Secondary hyperparathyroidism of renal origin: Secondary | ICD-10-CM | POA: Diagnosis not present

## 2019-10-05 DIAGNOSIS — S81802A Unspecified open wound, left lower leg, initial encounter: Secondary | ICD-10-CM | POA: Diagnosis not present

## 2019-10-06 ENCOUNTER — Telehealth: Payer: Self-pay

## 2019-10-06 ENCOUNTER — Other Ambulatory Visit: Payer: Self-pay

## 2019-10-06 ENCOUNTER — Emergency Department (HOSPITAL_COMMUNITY)
Admission: EM | Admit: 2019-10-06 | Discharge: 2019-10-06 | Disposition: A | Discharge status: Home / Self Care | Payer: Medicare Other | Attending: Emergency Medicine | Admitting: Emergency Medicine

## 2019-10-06 ENCOUNTER — Encounter (HOSPITAL_COMMUNITY): Payer: Self-pay | Admitting: Pharmacy Technician

## 2019-10-06 DIAGNOSIS — I132 Hypertensive heart and chronic kidney disease with heart failure and with stage 5 chronic kidney disease, or end stage renal disease: Secondary | ICD-10-CM | POA: Insufficient documentation

## 2019-10-06 DIAGNOSIS — M25562 Pain in left knee: Secondary | ICD-10-CM | POA: Diagnosis not present

## 2019-10-06 DIAGNOSIS — Z4801 Encounter for change or removal of surgical wound dressing: Secondary | ICD-10-CM | POA: Insufficient documentation

## 2019-10-06 DIAGNOSIS — Z992 Dependence on renal dialysis: Secondary | ICD-10-CM | POA: Insufficient documentation

## 2019-10-06 DIAGNOSIS — Z794 Long term (current) use of insulin: Secondary | ICD-10-CM | POA: Insufficient documentation

## 2019-10-06 DIAGNOSIS — Z79899 Other long term (current) drug therapy: Secondary | ICD-10-CM | POA: Insufficient documentation

## 2019-10-06 DIAGNOSIS — Z89512 Acquired absence of left leg below knee: Secondary | ICD-10-CM | POA: Diagnosis not present

## 2019-10-06 DIAGNOSIS — J45909 Unspecified asthma, uncomplicated: Secondary | ICD-10-CM | POA: Insufficient documentation

## 2019-10-06 DIAGNOSIS — Z5189 Encounter for other specified aftercare: Secondary | ICD-10-CM

## 2019-10-06 DIAGNOSIS — Z91040 Latex allergy status: Secondary | ICD-10-CM | POA: Insufficient documentation

## 2019-10-06 DIAGNOSIS — I5042 Chronic combined systolic (congestive) and diastolic (congestive) heart failure: Secondary | ICD-10-CM | POA: Insufficient documentation

## 2019-10-06 DIAGNOSIS — E1122 Type 2 diabetes mellitus with diabetic chronic kidney disease: Secondary | ICD-10-CM | POA: Diagnosis not present

## 2019-10-06 DIAGNOSIS — N186 End stage renal disease: Secondary | ICD-10-CM | POA: Insufficient documentation

## 2019-10-06 DIAGNOSIS — Z7982 Long term (current) use of aspirin: Secondary | ICD-10-CM | POA: Insufficient documentation

## 2019-10-06 MED ORDER — ACETAMINOPHEN 500 MG PO TABS
1000.0000 mg | ORAL_TABLET | Freq: Once | ORAL | Status: AC
  Administered 2019-10-06: 1000 mg via ORAL
  Filled 2019-10-06: qty 2

## 2019-10-06 NOTE — Discharge Summary (Signed)
[facility] Discharge Summary   Patient ID:  [name] MRN: [id number] DOB/AGE: [birth year] 69 y.o.  Admit date: 09/30/2019 Discharge date: 10/01/19 Date of Surgery: 09/30/2019 Surgeon: Surgeon(s): Fields, Jessy Oto, MD  Admission Diagnosis: PAD (peripheral artery disease) [facility]) [I73.9]  Discharge Diagnoses:  PAD (peripheral artery disease) ([place]) [I73.9]  Secondary Diagnoses: Past Medical History:  Diagnosis Date  . Anemia   . Anxiety   . Arthritis    "joints" (06/15/2013)  . Asthma   . 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 Blood transfusion without reported diagnosis   . Breast cancer ([place])    left  . CHF (congestive heart failure) ([place])   . 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 Esophageal ulcer with bleeding   . ESRD (end stage renal disease) ([place])    "suppose to start dialysis today" (06/15/2013)  . Family history of adverse reaction to anesthesia    It took a while for pt sister to wake from anesthesia  . GERD (gastroesophageal reflux disease)   . Glaucoma    blind in both eyes  . Heart murmur    Mild AS, moderate MR, moderate TR 10/30/16 echo  . Hx of adenomatous colonic polyps 04/07/2018  . Hypertension   . Myalgia 12/31/2011  . Neuropathy 12/31/2011  . PAD (peripheral artery disease) (H[facility])    nonviable tissue left lower extremity  . Shortness of breath    "when she doesn't go to dialysis"  . Type II diabetes mellitus (H[facility])    Type II  . Walker as ambulation aid     Procedure(s): IRRIGATION AND DEBRIDEMENT WOUND OF LEFT BELOW KNEE AMPUTATION  Discharged Condition: stable  HPI: This is a 69 y.o. 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 Oneida Alar.  She was last seen on 09/22/2019 for wound check and staple removal.  The central stump was having drainage and separation with dusky edges. She was scheduled for irrigation and debridement.   Hospital Course:  [name] is a 69 y.o. female is S/P   Procedure(s): IRRIGATION AND DEBRIDEMENT WOUND OF LEFT BELOW KNEE AMPUTATION  She was discharged in stable condition with home wound vac and HH RN  for wound care.  Afebrile and WBC WNL.  No signs or symptoms of infection.     Consults:  Treatment Team:  Roney Jaffe, MD  Significant Diagnostic Studies: CBC Lab Results  Component Value Date   WBC 6.0 09/30/2019   HGB 8.7 (L) 09/30/2019   HCT 27.4 (L) 09/30/2019   MCV 96.1 09/30/2019   PLT 256 09/30/2019    BMET    Component Value Date/Time   NA 135 10/01/2019 1543   NA 141 06/13/2013 1037   K 4.6 10/01/2019 1543   K 4.4 06/13/2013 1037   CL 93 (L) 10/01/2019 1543   CL 110 (H) 10/01/2012 1052   CO2 29 10/01/2019 1543   CO2 20 (L) 06/13/2013 1037   GLUCOSE 189 (H) 10/01/2019 1543   GLUCOSE 124 06/13/2013 1037   GLUCOSE 101 (H) 10/01/2012 1052   BUN 24 (H) 10/01/2019 1543   BUN 93.4 (H) 06/13/2013 1037   CREATININE 6.08 (H) 10/01/2019 1543   CREATININE 9.3 (HH) 06/13/2013 1037   CALCIUM 8.8 (L) 10/01/2019 1543   CALCIUM 8.8 06/13/2013 1037   GFRNONAA 7 (L) 10/01/2019 1543   GFRAA 8 (L) 10/01/2019 1543   COAG Lab Results  Component Value Date  INR 1.16 05/07/2018   INR 1.13 02/06/2018   INR 1.03 06/17/2013     Disposition:  Discharge to :Home Discharge Instructions    Call MD for:  redness, tenderness, or signs of infection (pain, swelling, bleeding, redness, odor or green/yellow discharge around incision site)   Complete by: As directed    Call MD for:  severe or increased pain, loss or decreased feeling  in affected limb(s)   Complete by: As directed    Call MD for:  temperature >100.5   Complete by: As directed    Resume previous diet   Complete by: As directed      Allergies as of 10/01/2019      Reactions   Latex Hives   Oxycodone Other (See Comments)   Hallucinations    Tramadol Other (See Comments)   hallucinations    Tape Rash   4M Transpore adhesive tape. Medical tape   Vicodin  [hydrocodone-acetaminophen] Other (See Comments)   hallucinations      Medication List    TAKE these medications   acetaminophen 500 MG tablet Commonly known as: TYLENOL Take 1,000 mg by mouth every 6 (six) hours as needed for mild pain or moderate pain.   albuterol 108 (90 Base) MCG/ACT inhaler Commonly known as: VENTOLIN HFA Inhale into the lungs every 6 (six) hours as needed for wheezing or shortness of breath.   albuterol (2.5 MG/4ML) 0.083% nebulizer solution Commonly known as: PROVENTIL Take 3 mLs (2.5 mg total) by nebulization every 6 (six) hours as needed for wheezing or shortness of breath.   amLODipine 5 MG tablet Commonly known as: NORVASC Take 5 mg by mouth daily as needed (low blood pressure).   aspirin EC 81 MG tablet Take 81 mg by mouth daily.   atropine 1 % ophthalmic solution Place 1 drop into the right eye 2 (two) times daily.   B-D SINGLE USE SWABS REGULAR Pads USE UTD FOR INSULIN INJECTIONS TID   BD Pen Needle Nano 2nd Gen 32G X 4 MM Misc Generic drug: Insulin Pen Needle USE WITH INSULIN PEN 3 TIMES A DAY   carvedilol 25 MG tablet Commonly known as: COREG Take 25 mg by mouth 2 (two) times daily.   clopidogrel 75 MG tablet Commonly known as: Plavix Take 1 tablet (75 mg total) by mouth daily.   darbepoetin 200 MCG/0.4ML Soln injection Commonly known as: ARANESP Inject 0.4 mLs (200 mcg total) into the vein every Wednesday with hemodialysis.   diclofenac sodium 1 % Gel Commonly known as: VOLTAREN Apply 1 application topically 4 (four) times daily as needed (pain.).   docusate sodium 100 MG capsule Commonly known as: COLACE Take 100 mg by mouth daily as needed for mild constipation.   doxercalciferol 4 MCG/2ML injection Commonly known as: HECTOROL Inject 0.5 mLs (1 mcg total) into the vein every Monday, Wednesday, and Friday with hemodialysis.   fluticasone 50 MCG/ACT nasal spray Commonly known as: FLONASE Place 1-2 sprays into both  nostrils daily as needed for allergies.   gabapentin 100 MG capsule Commonly known as: NEURONTIN Take 100 mg by mouth 2 (two) times daily.   lanthanum 1000 MG chewable tablet Commonly known as: FOSRENOL Chew 1,000-3,000 mg by mouth See admin instructions. Take 4000 mg)by mouth with each meal & take 1000 mg by mouth with each snack   lisinopril 40 MG tablet Commonly known as: ZESTRIL Take 40 mg by mouth at bedtime.   mupirocin ointment 2 % Commonly known as: BACTROBAN Apply to affected  area right foot once daily What changed:   how much to take  how to take this  when to take this   NovoLOG FlexPen 100 UNIT/ML FlexPen Generic drug: insulin aspart Inject 6-10 Units into the skin 3 (three) times daily with meals. per Sliding scale   omeprazole 40 MG capsule Commonly known as: PRILOSEC Take 1 capsule (40 mg total) by mouth daily. What changed: when to take this   OneTouch Verio test strip Generic drug: glucose blood 3 (three) times daily. for testing   prednisoLONE acetate 1 % ophthalmic suspension Commonly known as: PRED FORTE Place 1 drop into the right eye 2 (two) times daily.   Rena-Vite Rx 1 MG Tabs Take 1 tablet by mouth in the morning and at bedtime.   traMADol 50 MG tablet Commonly known as: ULTRAM TAKE 1 TABLET(50 MG) BY MOUTH EVERY 6 HOURS AS NEEDED FOR MODERATE PAIN What changed: See the new instructions.   traMADol 50 MG tablet Commonly known as: Ultram Take 1 tablet (50 mg total) by mouth every 6 (six) hours as needed. What changed: You were already taking a medication with the same name, and this prescription was added. Make sure you understand how and when to take each.      Verbal and written Discharge instructions given to the patient. Wound care per Discharge AVS [place], [facility] Follow up.   Specialty: Home Health Services Why: [facility] resumed for home wound VAC needs- ([employer] home wound VAC ordered  to be delivered to bedside for transition home) Contact information: [place] [address] [phone]        [name], MD Follow up in 2 week(s).   Specialties: Vascular Surgery, Cardiology Why: office will call Contact information: [address] [place] [address] [phone]           Signed: Roxy Horseman 10/06/2019, 12:18 PM

## 2019-10-06 NOTE — Discharge Planning (Signed)
Pt currently active with [facility] for [place] services as confirmed by [facility] with [place] [facility].  Pt will resume [place] services of **.  Thank you! Shavonda Wiedman J. Clydene Laming, [place], [place], [employer] [phone].

## 2019-10-06 NOTE — Discharge Instructions (Addendum)
Continue wound care as previously established.  Continue medications as previously prescribed.

## 2019-10-06 NOTE — Consult Note (Signed)
WOC Nurse Consult Note: COnsulted to re-apply wound VAC  There is no VAC  Home device here to apply to wound .  Family member has asked someone to bring the device in.  She is not sure if this is a [employer] WOUND VAc.  Vascular surgery notes indicate that is what was ordered.  Will await call that device, foam kit and charged battery have arrived.  Family and bedside RN verbalize understanding. RN to send secure chat when ready.   Domenic Moras MSN, RN, FNP-BC CWON Wound, Ostomy, Continence Nurse Pager [phone]

## 2019-10-06 NOTE — ED Triage Notes (Signed)
Pt bib family for wound check. Pt has a wound vac to LLE X1 week. Per family pt home health RN came to clean out and redress wound today, however RN hit a nerve on the leg and was unable to reapply the wound vac. Family reports also unable to get the leg to stop bleeding. Bandage applied pta, bleeding appears to be controlled. Pt takes plavix. Called pcp and was sent here for evaluation.

## 2019-10-06 NOTE — ED Notes (Signed)
Patient verbalizes understanding of discharge instructions. Opportunity for questioning and answers were provided. Armband removed by staff. Patient discharged from ED. Pt legally blind, cannot sign pad.

## 2019-10-06 NOTE — Consult Note (Addendum)
[place] Nurse wound follow up Wound type:Left BKA amputation site with dehisced incision site.  Measurement: 6 cm x 12 cm x 0.4 cm  Wound CWC:BJSEG red  No bleeding noted.  10% yellow devitalized slough Drainage (amount, consistency, odor) moderate serosanguinous  No odor.  Periwound:intact suture line  Dressing procedure/placement/frequency: Cleanse left BKA surgical site with NS and apply NS moist gauze to wound bed.  COver with ABD pad and kerlix/tape.    HH to resume VAC dressing at visit tomorrow.  Patient is ready for discharge from a wound care perspective.  Will not follow at this time.  Please re-consult if needed.  Domenic Moras MSN, RN, FNP-BC CWON Wound, Ostomy, Continence Nurse Pager [phone]

## 2019-10-06 NOTE — ED Provider Notes (Signed)
[name]   CSN: [id number] Arrival date & time: 10/06/19  1114     History Chief Complaint  Patient presents with  . Wound Check    [name] is a 68 y.o. female.  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  Patient is undergoing wound care at home by wound VAC and home health nursing.  Patient apparently experienced significant discomfort while the wound VAC was being applied, pain that the patient describes as "hitting a nerve".  There is also apparently some bleeding that was difficult to control, however this has since resolved.  Patient was instructed by the primary doctor's office to come here to have the wound evaluated.  Patient denies fevers or chills.  According to the patient's family at bedside, the wound is actually looking better.  The history is provided by the patient.       Past Medical History:  Diagnosis Date  . Anemia   . Anxiety   . Arthritis    "joints" (06/15/2013)  . Asthma   . 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 Blood transfusion without reported diagnosis   . Breast cancer ([place])    left  . CHF (congestive heart failure) ([place])   . 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 Esophageal ulcer with bleeding   . ESRD (end stage renal disease) ([place])    "suppose to start dialysis today" (06/15/2013)  . Family history of adverse reaction to anesthesia    It took a while for pt sister to wake from anesthesia  . GERD (gastroesophageal reflux disease)   . Glaucoma    blind in both eyes  . Heart murmur    Mild AS, moderate MR, moderate TR 10/30/16 echo  . Hx of adenomatous colonic polyps 04/07/2018  . Hypertension   . Myalgia 12/31/2011  . Neuropathy 12/31/2011  . PAD (peripheral artery disease) (HCC)    nonviable tissue  left lower extremity  . Shortness of breath    "when she doesn't go to dialysis"  . Type II diabetes mellitus (HCC)    Type II  . Walker as ambulation aid     Patient Active Problem List   Diagnosis Date Noted  . PAD (peripheral artery disease) ([place]) 09/30/2019  . Gangrene of foot ([place]) 08/30/2019  . Other fatigue 02/28/2019  . Finger pain, left 02/03/2019  . Trigger ring finger of left hand 02/03/2019  . Osteomyelitis, unspecified ([place]) 11/16/2018  . Diabetic retinopathy associated with type 2 diabetes mellitus ([place]) 11/15/2018  . Rheumatoid factor positive 11/15/2018  . Anemia in ESRD (end-stage renal disease) ([place]) 05/06/2018  . Asthma 05/06/2018  . Hx of adenomatous colonic polyps 04/07/2018  . Status post carpal tunnel release 12/10/2017  . Carpal tunnel syndrome, left upper limb 11/10/2017  . Carpal tunnel syndrome, right upper limb 11/10/2017  . Bilateral hand numbness 09/28/2017  . Dependence on renal dialysis ([place]) 09/11/2017  . Abdominal discomfort, epigastric   . Hypertension 07/06/2017  . Chronic combined systolic and diastolic CHF (congestive heart failure) ([place]) 07/06/2017  . Complicated migraine 71/24/5809  . ESRD on dialysis ([place]) 06/04/2017  . Blindness of both eyes 06/04/2017  . Glaucoma 06/04/2017  . Essential hypertension 10/27/2016  . GERD (gastroesophageal reflux disease) 10/27/2016  . Hypercalcemia 05/28/2015  . Abnormal stress test  03/15/2015  . Poor venous access 02/08/2015  . Breast cancer ([place]) 11/02/2014  . Diarrhea, unspecified 11/02/2014  . Fever, unspecified 11/02/2014  . Pain, unspecified 11/02/2014  . Pruritus, unspecified 11/02/2014  . Shortness of breath 07/28/2014  . Acquired absence of eye 03/31/2014  . Coagulation defect, unspecified ([place]) 03/22/2014  . Dysuria 02/14/2014  . Encounter for immunization 01/03/2014  . Iron deficiency anemia, unspecified 10/18/2013  . Unspecified protein-calorie malnutrition ([place]) 09/23/2013  .  Complication of vascular dialysis catheter 06/21/2013  . Fibromyalgia 06/21/2013  . Personal history of breast cancer 06/21/2013  . Personal history of other diseases of the musculoskeletal system and connective tissue 06/21/2013  . Type 2 diabetes mellitus with diabetic polyneuropathy ([place]) 06/21/2013  . Secondary hyperparathyroidism of renal origin ([place]) 06/20/2013  . Unspecified complication of cardiac and vascular prosthetic device, implant and graft, subsequent encounter 06/20/2013  . Type II diabetes mellitus with renal manifestations ([place]) 06/15/2013  . Chronic kidney disease (CKD), stage IV (severe) ([place]) 06/03/2013  . Neuropathy 12/31/2011  . Breast cancer of upper-inner quadrant of left female breast ([place]) 06/27/2011    Past Surgical History:  Procedure Laterality Date  . A/V FISTULAGRAM N/A 09/21/2018   Procedure: A/V FISTULAGRAM - Right Upper;  Surgeon: [name], MD;  Location: [employer] CV LAB;  Service: Cardiovascular;  Laterality: N/A;  . ABDOMINAL AORTOGRAM N/A 11/30/2018   Procedure: ABDOMINAL AORTOGRAM;  Surgeon: [name], MD;  Location: [place] [facility] LAB;  Service: Cardiovascular;  Laterality: N/A;  . ABDOMINAL AORTOGRAM W/LOWER EXTREMITY Left 07/29/2019   Procedure: ABDOMINAL AORTOGRAM W/LOWER EXTREMITY;  Surgeon: [name], MD;  Location: [place] [facility] LAB;  Service: Cardiovascular;  Laterality: Left;  . ABDOMINAL HYSTERECTOMY     partial  . AMPUTATION Left 08/30/2019   Procedure: AMPUTATION BELOW KNEE LEFT;  Surgeon: [name], MD;  Location: [facility] OR;  Service: Vascular;  Laterality: Left;  . AV FISTULA PLACEMENT Right 06/06/2013   Procedure: ARTERIOVENOUS (AV) FISTULA CREATION-RIGHT BRACHIAL CEPHALIC;  Surgeon: Conrad Polo, MD;  Location: [place];  Service: Vascular;  Laterality: Right;  . BREAST BIOPSY Left   . BREAST LUMPECTOMY Left    "and took out some lymph nodes" (06/15/2013)  . CARDIAC CATHETERIZATION     04/02/15 [facility]): no  angiographic CAD, LVEF 40% with global hypokinesis (LHC done for + stress echo, EF 45% 11/28/14)  . CARPAL TUNNEL RELEASE Left 11/26/2017   Procedure: LEFT CARPAL TUNNEL RELEASE;  Surgeon: [name], MD;  Location: [place];  Service: Orthopedics;  Laterality: Left;  . CATARACT EXTRACTION W/ ANTERIOR VITRECTOMY Bilateral   . CESAREAN SECTION  1980  . COLONOSCOPY    . ESOPHAGOGASTRODUODENOSCOPY N/A 07/08/2017   Procedure: ESOPHAGOGASTRODUODENOSCOPY (EGD);  Surgeon: [name], MD;  Location: [facility] ENDOSCOPY;  Service: Endoscopy;  Laterality: N/A;  . ESOPHAGOGASTRODUODENOSCOPY (EGD) WITH PROPOFOL N/A 02/07/2018   Procedure: ESOPHAGOGASTRODUODENOSCOPY (EGD) WITH PROPOFOL;  Surgeon: [name], MD;  Location: [facility] ENDOSCOPY;  Service: Gastroenterology;  Laterality: N/A;  APC and clips placed  . EYE SURGERY Bilateral    laser surgery  . LOWER EXTREMITY ANGIOGRAPHY Bilateral 11/30/2018   Procedure: LOWER EXTREMITY ANGIOGRAPHY;  Surgeon: [name], MD;  Location: [place] CV LAB;  Service: Cardiovascular;  Laterality: Bilateral;  . PARS PLANA VITRECTOMY Right 05/05/2017   Procedure: PARS PLANA VITRECTOMY WITH 25 GAUGE; PARTIAL REMOVAL OF OIL; INFERIOR PERIPHERAL IRIDECTOMY, REFORM ANTERIOR CHAMBER RIGHT EYE;  Surgeon: [name], MD;  Location: [facility]  OR;  Service: Ophthalmology;  Laterality: Right;  . PERIPHERAL VASCULAR BALLOON ANGIOPLASTY Right 09/21/2018   Procedure: PERIPHERAL VASCULAR BALLOON ANGIOPLASTY;  Surgeon: [name], MD;  Location: [place] CV LAB;  Service: Cardiovascular;  Laterality: Right;  AV fistula  . PERIPHERAL VASCULAR BALLOON ANGIOPLASTY Left 11/30/2018   Procedure: PERIPHERAL VASCULAR BALLOON ANGIOPLASTY;  Surgeon: [name], MD;  Location: [place] CV LAB;  Service: Cardiovascular;  Laterality: Left;  Left Anterior Tibial Artery  . REFRACTIVE SURGERY Bilateral   . REMOVAL OF A DIALYSIS CATHETER Right 06/06/2013   Procedure: REMOVAL OF  RIGHT MEDIPORT;  Surgeon: Conrad Ricketts, MD;  Location: [place];  Service: Vascular;  Laterality: Right;  . TONSILLECTOMY    . UPPER GASTROINTESTINAL ENDOSCOPY    . WOUND DEBRIDEMENT Left 09/30/2019   Procedure: IRRIGATION AND DEBRIDEMENT WOUND OF LEFT BELOW KNEE AMPUTATION;  Surgeon: [name], MD;  Location[facility] OR;  Service: Vascular;  Laterality: Left;     OB History   No obstetric history on file.     Family History  Problem Relation Age of Onset  . Diabetes Mother   . Hyperlipidemia Mother   . Hypertension Mother   . Hypertension Father   . Diabetes Sister   . Diabetes Brother   . Hypertension Brother   . Heart attack Brother   . Kidney disease Brother   . Colon cancer Neg Hx   . Colon polyps Neg Hx   . Esophageal cancer Neg Hx   . Gallbladder disease Neg Hx   . Rectal cancer Neg Hx   . Stomach cancer Neg Hx     Social History   Tobacco Use  . Smoking status: Never Smoker  . Smokeless tobacco: Never Used  Substance Use Topics  . Alcohol use: No  . Drug use: No    Home Medications Prior to Admission medications   Medication Sig Start Date End Date Taking? Authorizing Provider  acetaminophen (TYLENOL) 500 MG tablet Take 1,000 mg by mouth every 6 (six) hours as needed for mild pain or moderate pain.     [provider]  albuterol (PROVENTIL) (2.5 MG/3ML) 0.083% nebulizer solution Take 3 mLs (2.5 mg total) by nebulization every 6 (six) hours as needed for wheezing or shortness of breath. 10/30/16   Theodis Blaze, MD  albuterol (VENTOLIN HFA) 108 (90 Base) MCG/ACT inhaler Inhale into the lungs every 6 (six) hours as needed for wheezing or shortness of breath.    [provider]  Alcohol Swabs (B-D SINGLE USE SWABS REGULAR) PADS USE UTD FOR INSULIN INJECTIONS TID 03/08/19   [provider]  amLODipine (NORVASC) 5 MG tablet Take 5 mg by mouth daily as needed (low blood pressure).  05/21/18   [provider]  aspirin EC 81 MG tablet  Take 81 mg by mouth daily.    [provider]  atropine 1 % ophthalmic solution Place 1 drop into the right eye 2 (two) times daily. 07/04/17   [provider]  B Complex-C-Folic Acid (RENA-VITE RX) 1 MG TABS Take 1 tablet by mouth in the morning and at bedtime.  09/29/16   [provider]  BD PEN NEEDLE NANO 2ND GEN 32G X 4 MM MISC USE WITH INSULIN PEN 3 TIMES A DAY 03/03/19   [provider]  carvedilol (COREG) 25 MG tablet Take 25 mg by mouth 2 (two) times daily. 04/03/18   [provider]  clopidogrel (PLAVIX) 75 MG tablet Take 1  tablet (75 mg total) by mouth daily. 11/30/18   [name], MD  darbepoetin (ARANESP) 200 MCG/0.4ML SOLN injection Inject 0.4 mLs (200 mcg total) into the vein every Wednesday with hemodialysis. 06/22/13   Geradine Girt, DO  diclofenac sodium (VOLTAREN) 1 % GEL Apply 1 application topically 4 (four) times daily as needed (pain.).  04/28/19   [provider]  docusate sodium (COLACE) 100 MG capsule Take 100 mg by mouth daily as needed for mild constipation.     [provider]  doxercalciferol (HECTOROL) 4 MCG/2ML injection Inject 0.5 mLs (1 mcg total) into the vein every Monday, Wednesday, and Friday with hemodialysis. 06/18/13   Geradine Girt, DO  fluticasone (FLONASE) 50 MCG/ACT nasal spray Place 1-2 sprays into both nostrils daily as needed for allergies.  02/07/19   [provider]  gabapentin (NEURONTIN) 100 MG capsule Take 100 mg by mouth 2 (two) times daily.    [provider]  insulin aspart (NOVOLOG FLEXPEN) 100 UNIT/ML FlexPen Inject 6-10 Units into the skin 3 (three) times daily with meals. per Sliding scale    [provider]  lanthanum (FOSRENOL) 1000 MG chewable tablet Chew 1,000-3,000 mg by mouth See admin instructions. Take 4000 mg)by mouth with each meal & take 1000 mg by mouth with each snack    [provider]  lisinopril (PRINIVIL,ZESTRIL) 40 MG tablet  Take 40 mg by mouth at bedtime.     [provider]  mupirocin ointment (BACTROBAN) 2 % Apply to affected area right foot once daily Patient taking differently: Apply 1 application topically daily. Apply to affected area right foot once daily 08/16/19   Marzetta Board, DPM  omeprazole (PRILOSEC) 40 MG capsule Take 1 capsule (40 mg total) by mouth daily. Patient taking differently: Take 40 mg by mouth 2 (two) times daily.  02/09/18   Cherene Altes, MD  [facility] VERIO test strip 3 (three) times daily. for testing 12/15/17   [provider]  prednisoLONE acetate (PRED FORTE) 1 % ophthalmic suspension Place 1 drop into the right eye 2 (two) times daily.  04/27/17   [provider]  traMADol (ULTRAM) 50 MG tablet TAKE 1 TABLET(50 MG) BY MOUTH EVERY 6 HOURS AS NEEDED FOR MODERATE PAIN Patient taking differently: Take 50 mg by mouth every 6 (six) hours as needed for moderate pain.  09/26/19   Angelia Mould, MD  traMADol (ULTRAM) 50 MG tablet Take 1 tablet (50 mg total) by mouth every 6 (six) hours as needed. 10/01/19   Ulyses Amor, PA-C    Allergies    Latex, Oxycodone, Tramadol, Tape, and Vicodin [hydrocodone-acetaminophen]  Review of Systems   Review of Systems  All other systems reviewed and are negative.   Physical Exam Updated Vital Signs BP (!) 144/48   Pulse 73   Temp 98.3 F (36.8 C) (Oral)   Resp 16   Ht 5\' 5"  (1.651 m)   Wt 86.2 kg   LMP  (LMP Unknown)   SpO2 93%   BMI 31.62 kg/m   Physical Exam Vitals and nursing Kane reviewed.  Constitutional:      General: She is not in acute distress.    Appearance: She is well-developed. She is not diaphoretic.  HENT:     Head: Normocephalic and atraumatic.  Cardiovascular:     Rate and Rhythm: Normal rate and regular rhythm.     Heart sounds: No murmur. No friction rub. No gallop.   Pulmonary:  Effort: Pulmonary effort is normal. No respiratory distress.     Breath sounds: Normal  breath sounds. No wheezing.  Abdominal:     General: Bowel sounds are normal. There is no distension.     Palpations: Abdomen is soft.     Tenderness: There is no abdominal tenderness.  Musculoskeletal:        General: Normal range of motion.     Cervical back: Normal range of motion and neck supple.     Comments: The left lower extremity is status post recent below the knee amputation.  The incision is open and healing by secondary intention.  There is no active bleeding.  Skin:    General: Skin is warm and dry.  Neurological:     Mental Status: She is alert and oriented to person, place, and time.     ED Results / Procedures / Treatments   Labs (all labs ordered are listed, but only abnormal results are displayed) Labs Reviewed - No data to display  EKG None  Radiology No results found.  Procedures Procedures (including critical care time)  Medications Ordered in ED Medications - No data to display  ED Course  I have reviewed the triage vital signs and the nursing notes.  Pertinent labs & imaging results that were available during my care of the patient were reviewed by me and considered in my medical decision making (see chart for details).  MDM Rules/Calculators/A&P  Patient referred to the ER due to pain and bleeding from her left leg incision.  She is status post below the knee amputation and undergoing wound treatment at home with wound VAC.  By the time she arrives here, her bleeding has resolved and she appears much more comfortable.  Wound care was consulted and has evaluated the patient.  A dressing was applied and they feel as though patient can resume her normal treatments at home.  Patient to be discharged with as needed return.  Final Clinical Impression(s) / ED Diagnoses Final diagnoses:  None    Rx / DC Orders ED Discharge Orders    None       Veryl Speak, MD 10/06/19 1434

## 2019-10-06 NOTE — Telephone Encounter (Addendum)
I called [name] to relay Dr. Oneida Alar message to her.  She verbalized understanding and will contact the patient's sister to make an earlier appt with a PA.    Thurston Hole., LPN  ----- Message from [name], MD sent at 10/06/2019  8:51 AM EDT ----- Regarding: RE: Wound Care Contact: [phone]  That's fine  ----- Message ----- From: Kaleen Mask, LPN Sent: 02/14/6183   3:45 PM EDT To: [name], MD Subject: [place]. [name], [facility] nurse just called regarding this patient.  She is refusing to let her put the wound vac back on her leg because of pain.  She also has a place on her stump that is "bleeding and may need stitching". I was going to have her come in earlier than 10/20/19 to see a PA unless you think otherwise.  [name] put a wet to dry bandage over her leg.  Thanks,  Thurston Hole., LPN

## 2019-10-10 DIAGNOSIS — D509 Iron deficiency anemia, unspecified: Secondary | ICD-10-CM | POA: Diagnosis not present

## 2019-10-10 DIAGNOSIS — S81802A Unspecified open wound, left lower leg, initial encounter: Secondary | ICD-10-CM | POA: Diagnosis not present

## 2019-10-10 DIAGNOSIS — D631 Anemia in chronic kidney disease: Secondary | ICD-10-CM | POA: Diagnosis not present

## 2019-10-10 DIAGNOSIS — N2581 Secondary hyperparathyroidism of renal origin: Secondary | ICD-10-CM | POA: Diagnosis not present

## 2019-10-10 DIAGNOSIS — Z992 Dependence on renal dialysis: Secondary | ICD-10-CM | POA: Diagnosis not present

## 2019-10-10 DIAGNOSIS — N186 End stage renal disease: Secondary | ICD-10-CM | POA: Diagnosis not present

## 2019-10-12 DIAGNOSIS — D509 Iron deficiency anemia, unspecified: Secondary | ICD-10-CM | POA: Diagnosis not present

## 2019-10-12 DIAGNOSIS — N2581 Secondary hyperparathyroidism of renal origin: Secondary | ICD-10-CM | POA: Diagnosis not present

## 2019-10-12 DIAGNOSIS — Z992 Dependence on renal dialysis: Secondary | ICD-10-CM | POA: Diagnosis not present

## 2019-10-12 DIAGNOSIS — D631 Anemia in chronic kidney disease: Secondary | ICD-10-CM | POA: Diagnosis not present

## 2019-10-12 DIAGNOSIS — N186 End stage renal disease: Secondary | ICD-10-CM | POA: Diagnosis not present

## 2019-10-12 DIAGNOSIS — S81802A Unspecified open wound, left lower leg, initial encounter: Secondary | ICD-10-CM | POA: Diagnosis not present

## 2019-10-14 DIAGNOSIS — D509 Iron deficiency anemia, unspecified: Secondary | ICD-10-CM | POA: Diagnosis not present

## 2019-10-14 DIAGNOSIS — N186 End stage renal disease: Secondary | ICD-10-CM | POA: Diagnosis not present

## 2019-10-14 DIAGNOSIS — Z992 Dependence on renal dialysis: Secondary | ICD-10-CM | POA: Diagnosis not present

## 2019-10-14 DIAGNOSIS — D631 Anemia in chronic kidney disease: Secondary | ICD-10-CM | POA: Diagnosis not present

## 2019-10-14 DIAGNOSIS — N2581 Secondary hyperparathyroidism of renal origin: Secondary | ICD-10-CM | POA: Diagnosis not present

## 2019-10-14 DIAGNOSIS — S81802A Unspecified open wound, left lower leg, initial encounter: Secondary | ICD-10-CM | POA: Diagnosis not present

## 2019-10-17 DIAGNOSIS — N2581 Secondary hyperparathyroidism of renal origin: Secondary | ICD-10-CM | POA: Diagnosis not present

## 2019-10-17 DIAGNOSIS — D631 Anemia in chronic kidney disease: Secondary | ICD-10-CM | POA: Diagnosis not present

## 2019-10-17 DIAGNOSIS — N186 End stage renal disease: Secondary | ICD-10-CM | POA: Diagnosis not present

## 2019-10-17 DIAGNOSIS — S81802A Unspecified open wound, left lower leg, initial encounter: Secondary | ICD-10-CM | POA: Diagnosis not present

## 2019-10-17 DIAGNOSIS — Z992 Dependence on renal dialysis: Secondary | ICD-10-CM | POA: Diagnosis not present

## 2019-10-17 DIAGNOSIS — D509 Iron deficiency anemia, unspecified: Secondary | ICD-10-CM | POA: Diagnosis not present

## 2019-10-19 ENCOUNTER — Emergency Department (HOSPITAL_COMMUNITY): Payer: Medicare Other

## 2019-10-19 ENCOUNTER — Telehealth: Payer: Self-pay

## 2019-10-19 ENCOUNTER — Encounter (HOSPITAL_COMMUNITY): Payer: Self-pay

## 2019-10-19 ENCOUNTER — Inpatient Hospital Stay (HOSPITAL_COMMUNITY)
Admission: EM | Admit: 2019-10-19 | Discharge: 2019-10-24 | DRG: 474 | Disposition: A | Discharge status: Home Health | Payer: Medicare Other | Attending: Internal Medicine | Admitting: Internal Medicine

## 2019-10-19 ENCOUNTER — Other Ambulatory Visit: Payer: Self-pay

## 2019-10-19 DIAGNOSIS — E1152 Type 2 diabetes mellitus with diabetic peripheral angiopathy with gangrene: Secondary | ICD-10-CM | POA: Diagnosis present

## 2019-10-19 DIAGNOSIS — S81802A Unspecified open wound, left lower leg, initial encounter: Secondary | ICD-10-CM | POA: Diagnosis not present

## 2019-10-19 DIAGNOSIS — Z885 Allergy status to narcotic agent status: Secondary | ICD-10-CM | POA: Diagnosis not present

## 2019-10-19 DIAGNOSIS — Z853 Personal history of malignant neoplasm of breast: Secondary | ICD-10-CM

## 2019-10-19 DIAGNOSIS — E1142 Type 2 diabetes mellitus with diabetic polyneuropathy: Secondary | ICD-10-CM | POA: Diagnosis present

## 2019-10-19 DIAGNOSIS — Z91040 Latex allergy status: Secondary | ICD-10-CM | POA: Diagnosis not present

## 2019-10-19 DIAGNOSIS — H409 Unspecified glaucoma: Secondary | ICD-10-CM | POA: Diagnosis present

## 2019-10-19 DIAGNOSIS — I350 Nonrheumatic aortic (valve) stenosis: Secondary | ICD-10-CM | POA: Diagnosis present

## 2019-10-19 DIAGNOSIS — I5042 Chronic combined systolic (congestive) and diastolic (congestive) heart failure: Secondary | ICD-10-CM | POA: Diagnosis present

## 2019-10-19 DIAGNOSIS — H543 Unqualified visual loss, both eyes: Secondary | ICD-10-CM

## 2019-10-19 DIAGNOSIS — Z89512 Acquired absence of left leg below knee: Secondary | ICD-10-CM | POA: Diagnosis not present

## 2019-10-19 DIAGNOSIS — I132 Hypertensive heart and chronic kidney disease with heart failure and with stage 5 chronic kidney disease, or end stage renal disease: Secondary | ICD-10-CM | POA: Diagnosis present

## 2019-10-19 DIAGNOSIS — Z992 Dependence on renal dialysis: Secondary | ICD-10-CM | POA: Diagnosis not present

## 2019-10-19 DIAGNOSIS — Y835 Amputation of limb(s) as the cause of abnormal reaction of the patient, or of later complication, without mention of misadventure at the time of the procedure: Secondary | ICD-10-CM | POA: Diagnosis present

## 2019-10-19 DIAGNOSIS — M1990 Unspecified osteoarthritis, unspecified site: Secondary | ICD-10-CM | POA: Diagnosis present

## 2019-10-19 DIAGNOSIS — D631 Anemia in chronic kidney disease: Secondary | ICD-10-CM | POA: Diagnosis present

## 2019-10-19 DIAGNOSIS — K219 Gastro-esophageal reflux disease without esophagitis: Secondary | ICD-10-CM | POA: Diagnosis present

## 2019-10-19 DIAGNOSIS — I96 Gangrene, not elsewhere classified: Secondary | ICD-10-CM | POA: Diagnosis present

## 2019-10-19 DIAGNOSIS — Z6829 Body mass index (BMI) 29.0-29.9, adult: Secondary | ICD-10-CM

## 2019-10-19 DIAGNOSIS — Z794 Long term (current) use of insulin: Secondary | ICD-10-CM | POA: Diagnosis not present

## 2019-10-19 DIAGNOSIS — T8140XA Infection following a procedure, unspecified, initial encounter: Secondary | ICD-10-CM | POA: Diagnosis not present

## 2019-10-19 DIAGNOSIS — Z8719 Personal history of other diseases of the digestive system: Secondary | ICD-10-CM

## 2019-10-19 DIAGNOSIS — R011 Cardiac murmur, unspecified: Secondary | ICD-10-CM | POA: Diagnosis present

## 2019-10-19 DIAGNOSIS — L97419 Non-pressure chronic ulcer of right heel and midfoot with unspecified severity: Secondary | ICD-10-CM | POA: Diagnosis present

## 2019-10-19 DIAGNOSIS — Z7902 Long term (current) use of antithrombotics/antiplatelets: Secondary | ICD-10-CM

## 2019-10-19 DIAGNOSIS — N186 End stage renal disease: Secondary | ICD-10-CM | POA: Diagnosis present

## 2019-10-19 DIAGNOSIS — H548 Legal blindness, as defined in USA: Secondary | ICD-10-CM | POA: Diagnosis present

## 2019-10-19 DIAGNOSIS — Z86010 Personal history of colonic polyps: Secondary | ICD-10-CM

## 2019-10-19 DIAGNOSIS — Z79891 Long term (current) use of opiate analgesic: Secondary | ICD-10-CM

## 2019-10-19 DIAGNOSIS — Z83438 Family history of other disorder of lipoprotein metabolism and other lipidemia: Secondary | ICD-10-CM

## 2019-10-19 DIAGNOSIS — Z841 Family history of disorders of kidney and ureter: Secondary | ICD-10-CM

## 2019-10-19 DIAGNOSIS — Z20822 Contact with and (suspected) exposure to covid-19: Secondary | ICD-10-CM | POA: Diagnosis present

## 2019-10-19 DIAGNOSIS — Z888 Allergy status to other drugs, medicaments and biological substances status: Secondary | ICD-10-CM | POA: Diagnosis not present

## 2019-10-19 DIAGNOSIS — T148XXA Other injury of unspecified body region, initial encounter: Secondary | ICD-10-CM | POA: Diagnosis not present

## 2019-10-19 DIAGNOSIS — D62 Acute posthemorrhagic anemia: Secondary | ICD-10-CM | POA: Diagnosis not present

## 2019-10-19 DIAGNOSIS — Z833 Family history of diabetes mellitus: Secondary | ICD-10-CM

## 2019-10-19 DIAGNOSIS — I10 Essential (primary) hypertension: Secondary | ICD-10-CM | POA: Diagnosis not present

## 2019-10-19 DIAGNOSIS — L089 Local infection of the skin and subcutaneous tissue, unspecified: Secondary | ICD-10-CM

## 2019-10-19 DIAGNOSIS — E1122 Type 2 diabetes mellitus with diabetic chronic kidney disease: Secondary | ICD-10-CM | POA: Diagnosis present

## 2019-10-19 DIAGNOSIS — N2581 Secondary hyperparathyroidism of renal origin: Secondary | ICD-10-CM | POA: Diagnosis present

## 2019-10-19 DIAGNOSIS — N250 Renal osteodystrophy: Secondary | ICD-10-CM | POA: Diagnosis not present

## 2019-10-19 DIAGNOSIS — I120 Hypertensive chronic kidney disease with stage 5 chronic kidney disease or end stage renal disease: Secondary | ICD-10-CM | POA: Diagnosis not present

## 2019-10-19 DIAGNOSIS — F419 Anxiety disorder, unspecified: Secondary | ICD-10-CM | POA: Diagnosis present

## 2019-10-19 DIAGNOSIS — J45909 Unspecified asthma, uncomplicated: Secondary | ICD-10-CM | POA: Diagnosis present

## 2019-10-19 DIAGNOSIS — T8744 Infection of amputation stump, left lower extremity: Secondary | ICD-10-CM | POA: Diagnosis present

## 2019-10-19 DIAGNOSIS — D509 Iron deficiency anemia, unspecified: Secondary | ICD-10-CM | POA: Diagnosis not present

## 2019-10-19 DIAGNOSIS — E8889 Other specified metabolic disorders: Secondary | ICD-10-CM | POA: Diagnosis present

## 2019-10-19 DIAGNOSIS — Z8249 Family history of ischemic heart disease and other diseases of the circulatory system: Secondary | ICD-10-CM

## 2019-10-19 DIAGNOSIS — Z79899 Other long term (current) drug therapy: Secondary | ICD-10-CM

## 2019-10-19 DIAGNOSIS — Z881 Allergy status to other antibiotic agents status: Secondary | ICD-10-CM | POA: Diagnosis not present

## 2019-10-19 DIAGNOSIS — T8789 Other complications of amputation stump: Secondary | ICD-10-CM | POA: Diagnosis not present

## 2019-10-19 DIAGNOSIS — I739 Peripheral vascular disease, unspecified: Secondary | ICD-10-CM | POA: Diagnosis not present

## 2019-10-19 DIAGNOSIS — E11621 Type 2 diabetes mellitus with foot ulcer: Secondary | ICD-10-CM | POA: Diagnosis present

## 2019-10-19 DIAGNOSIS — E1129 Type 2 diabetes mellitus with other diabetic kidney complication: Secondary | ICD-10-CM | POA: Diagnosis not present

## 2019-10-19 DIAGNOSIS — E669 Obesity, unspecified: Secondary | ICD-10-CM | POA: Diagnosis present

## 2019-10-19 DIAGNOSIS — Z7982 Long term (current) use of aspirin: Secondary | ICD-10-CM

## 2019-10-19 DIAGNOSIS — M7989 Other specified soft tissue disorders: Secondary | ICD-10-CM | POA: Diagnosis not present

## 2019-10-19 LAB — CBC WITH DIFFERENTIAL/PLATELET
Abs Immature Granulocytes: 0.04 10*3/uL (ref 0.00–0.07)
Basophils Absolute: 0.1 10*3/uL (ref 0.0–0.1)
Basophils Relative: 1 %
Eosinophils Absolute: 0.1 10*3/uL (ref 0.0–0.5)
Eosinophils Relative: 1 %
HCT: 26.8 % — ABNORMAL LOW (ref 36.0–46.0)
Hemoglobin: 8.6 g/dL — ABNORMAL LOW (ref 12.0–15.0)
Immature Granulocytes: 0 %
Lymphocytes Relative: 24 %
Lymphs Abs: 2.7 10*3/uL (ref 0.7–4.0)
MCH: 30.7 pg (ref 26.0–34.0)
MCHC: 32.1 g/dL (ref 30.0–36.0)
MCV: 95.7 fL (ref 80.0–100.0)
Monocytes Absolute: 0.9 10*3/uL (ref 0.1–1.0)
Monocytes Relative: 8 %
Neutro Abs: 7.5 10*3/uL (ref 1.7–7.7)
Neutrophils Relative %: 66 %
Platelets: 258 10*3/uL (ref 150–400)
RBC: 2.8 MIL/uL — ABNORMAL LOW (ref 3.87–5.11)
RDW: 20.8 % — ABNORMAL HIGH (ref 11.5–15.5)
WBC: 11.3 10*3/uL — ABNORMAL HIGH (ref 4.0–10.5)
nRBC: 0 % (ref 0.0–0.2)

## 2019-10-19 LAB — LACTIC ACID, PLASMA
Lactic Acid, Venous: 2.3 mmol/L (ref 0.5–1.9)
Lactic Acid, Venous: 1.6 mmol/L (ref 0.5–1.9)

## 2019-10-19 LAB — COMPREHENSIVE METABOLIC PANEL
ALT: 22 U/L (ref 0–44)
AST: 31 U/L (ref 15–41)
Albumin: 2.7 g/dL — ABNORMAL LOW (ref 3.5–5.0)
Alkaline Phosphatase: 212 U/L — ABNORMAL HIGH (ref 38–126)
Anion gap: 15 (ref 5–15)
BUN: 12 mg/dL (ref 8–23)
CO2: 30 mmol/L (ref 22–32)
Calcium: 8.2 mg/dL — ABNORMAL LOW (ref 8.9–10.3)
Chloride: 93 mmol/L — ABNORMAL LOW (ref 98–111)
Creatinine, Ser: 4.35 mg/dL — ABNORMAL HIGH (ref 0.44–1.00)
GFR calc Af Amer: 11 mL/min — ABNORMAL LOW (ref >60)
GFR calc non Af Amer: 10 mL/min — ABNORMAL LOW (ref >60)
Glucose, Bld: 147 mg/dL — ABNORMAL HIGH (ref 70–99)
Potassium: 3.3 mmol/L — ABNORMAL LOW (ref 3.5–5.1)
Sodium: 138 mmol/L (ref 135–145)
Total Bilirubin: 1 mg/dL (ref 0.3–1.2)
Total Protein: 6.5 g/dL (ref 6.5–8.1)

## 2019-10-19 LAB — HEMOGLOBIN A1C
Hgb A1c MFr Bld: 6 % — ABNORMAL HIGH (ref 4.8–5.6)
Mean Plasma Glucose: 125.5 mg/dL

## 2019-10-19 MED ORDER — PIPERACILLIN-TAZOBACTAM IN DEX 2-0.25 GM/50ML IV SOLN
2.2500 g | Freq: Three times a day (TID) | INTRAVENOUS | Status: AC
  Administered 2019-10-19 – 2019-10-22 (×7): 2.25 g via INTRAVENOUS
  Filled 2019-10-19 (×9): qty 50

## 2019-10-19 MED ORDER — FENTANYL CITRATE (PF) 100 MCG/2ML IJ SOLN
25.0000 ug | INTRAMUSCULAR | Status: DC | PRN
  Administered 2019-10-21 (×3): 50 ug via INTRAVENOUS
  Administered 2019-10-22: 21:00:00 25 ug via INTRAVENOUS
  Filled 2019-10-19: qty 2

## 2019-10-19 MED ORDER — ACETAMINOPHEN 325 MG PO TABS: 650.0000 mg | ORAL_TABLET | Freq: Four times a day (QID) | ORAL | Status: DC | PRN

## 2019-10-19 MED ORDER — VANCOMYCIN HCL 2000 MG/400ML IV SOLN
2000.0000 mg | Freq: Once | INTRAVENOUS | Status: AC
  Administered 2019-10-19: 2000 mg via INTRAVENOUS
  Filled 2019-10-19: qty 400

## 2019-10-19 MED ORDER — FENTANYL CITRATE (PF) 100 MCG/2ML IJ SOLN
50.0000 ug | Freq: Once | INTRAMUSCULAR | Status: AC
  Administered 2019-10-19: 50 ug via INTRAVENOUS
  Filled 2019-10-19: qty 2

## 2019-10-19 MED ORDER — ONDANSETRON HCL 4 MG/2ML IJ SOLN
4.0000 mg | Freq: Four times a day (QID) | INTRAMUSCULAR | Status: DC | PRN
  Administered 2019-10-21: 4 mg via INTRAVENOUS

## 2019-10-19 MED ORDER — ONDANSETRON HCL 4 MG PO TABS: 4.0000 mg | ORAL_TABLET | Freq: Four times a day (QID) | ORAL | Status: DC | PRN

## 2019-10-19 MED ORDER — INSULIN ASPART 100 UNIT/ML ~~LOC~~ SOLN
0.0000 [IU] | SUBCUTANEOUS | Status: DC
  Administered 2019-10-23: 1 [IU] via SUBCUTANEOUS
  Administered 2019-10-23: 2 [IU] via SUBCUTANEOUS
  Administered 2019-10-23 (×2): 1 [IU] via SUBCUTANEOUS

## 2019-10-19 MED ORDER — ACETAMINOPHEN 650 MG RE SUPP: 650.0000 mg | Freq: Four times a day (QID) | RECTAL | Status: DC | PRN

## 2019-10-19 NOTE — Telephone Encounter (Signed)
Received a vm on triage line from [place], [place] at [facility]. Pt is coming in tomorrow for an appt with our PA. Pt is currently at HD and per PA the pt's wound vac on her revised BKA does not have a good seal, has an odor and pus. No fever. PA is giving her antibiotics at HD today. I have let our PA's know.

## 2019-10-19 NOTE — ED Triage Notes (Signed)
Pt arrives POV for eval of poss L leg wound infection. Pt had BKA done about 7 weeks ago, has been followed by home health. Today HHRN stated that it appeared to be infected. Foul smelling intriage, dsg left in place and intact. Pt reports pain there since amputation. Afebrile

## 2019-10-19 NOTE — Telephone Encounter (Signed)
[place], [facility] Nurse with [facility] called.  Pt has slough and eschar in and around her wound.  The seal of the Wound Vac was good but wound has a foul odor.  She said the family was not informed at [facility] of any antibiotics.  They will send the patient out to the ED.   Thurston Hole., LPN

## 2019-10-19 NOTE — H&P (Signed)
History and Physical    [name] UMP:[id number] DOB: [birth year] DOA: 10/19/2019  PCP: Prince Solian, MD  Patient coming from: Home  I have personally briefly reviewed patient's old medical records in [place]  Chief Complaint: Leg infection  HPI: [name] is a 69 y.o. 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  Pt presents to ED due to wound not looking good at all at dialysis today.  Wound vac without good seal, odor and purulent drainage from wound.   Patient has noted increasing pain, drainage, odor over the past several days.  No fevers, nausea, vomiting, or diarrhea.  Per epic, it looks like patient received dose of antibiotics at dialysis today due to the appearance of her wound and that she was supposed to follow in vascular clinic tomorrow.   ED Course: WBC 11.3k, HGB 8.6 (stable from 8.7 earlier this month).  X ray of knee: extensive soft tissue swelling at BKA site.  Started on zosyn in ED.  Vasc surg: keep NPO after MN, they will eval in AM, sounds like they are leaning towards AKA.   Review of Systems: As per HPI, otherwise all review of systems negative.  Past Medical History:  Diagnosis Date  . Anemia   . Anxiety   . Arthritis    "joints" (06/15/2013)  . Asthma   . 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 Blood transfusion without reported diagnosis   . Breast cancer ([place])    left  . CHF (congestive heart failure) ([place])   . 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 Esophageal ulcer with bleeding   . ESRD (end stage renal disease) ([place])    "suppose to start dialysis today" (06/15/2013)  . Family history of adverse reaction to anesthesia    It took a while for pt sister to wake from anesthesia  . GERD (gastroesophageal reflux disease)   .  Glaucoma    blind in both eyes  . Heart murmur    Mild AS, moderate MR, moderate TR 10/30/16 echo  . Hx of adenomatous colonic polyps 04/07/2018  . Hypertension   . Myalgia 12/31/2011  . Neuropathy 12/31/2011  . PAD (peripheral artery disease) (HCC)    nonviable tissue left lower extremity  . Shortness of breath    "when she doesn't go to dialysis"  . Type II diabetes mellitus (HCC)    Type II  . Walker as ambulation aid     Past Surgical History:  Procedure Laterality Date  . A/V FISTULAGRAM N/A 09/21/2018   Procedure: A/V FISTULAGRAM - Right Upper;  Surgeon: [name], MD;  Location: [place] [facility];  Service: Cardiovascular;  Laterality: N/A;  . ABDOMINAL AORTOGRAM N/A 11/30/2018   Procedure: ABDOMINAL AORTOGRAM;  Surgeon: [name], MD;  Location: [place] [facility];  Service: Cardiovascular;  Laterality: N/A;  . ABDOMINAL AORTOGRAM W/LOWER EXTREMITY Left 07/29/2019   Procedure: ABDOMINAL AORTOGRAM W/LOWER EXTREMITY;  Surgeon: [name], MD;  Location: [place] [facility];  Service: Cardiovascular;  Laterality: Left;  . ABDOMINAL HYSTERECTOMY     partial  . AMPUTATION Left 08/30/2019   Procedure: AMPUTATION BELOW KNEE LEFT;  Surgeon: [name], MD;  Location: [facility] OR;  Service: Vascular;  Laterality: Left;  . AV FISTULA  PLACEMENT Right 06/06/2013   Procedure: ARTERIOVENOUS (AV) FISTULA CREATION-RIGHT BRACHIAL CEPHALIC;  Surgeon: Conrad [place], MD;  Location: [place];  Service: Vascular;  Laterality: Right;  . BREAST BIOPSY Left   . BREAST LUMPECTOMY Left    "and took out some lymph nodes" (06/15/2013)  . CARDIAC CATHETERIZATION     04/02/15 [facility]): no angiographic CAD, LVEF 40% with global hypokinesis (LHC done for + stress echo, EF 45% 11/28/14)  . CARPAL TUNNEL RELEASE Left 11/26/2017   Procedure: LEFT CARPAL TUNNEL RELEASE;  Surgeon: [name], MD;  Location: [place];  Service: Orthopedics;  Laterality: Left;  . CATARACT EXTRACTION W/ ANTERIOR  VITRECTOMY Bilateral   . CESAREAN SECTION  1980  . COLONOSCOPY    . ESOPHAGOGASTRODUODENOSCOPY N/A 07/08/2017   Procedure: ESOPHAGOGASTRODUODENOSCOPY (EGD);  Surgeon: [name], MD;  Location: [facility] ENDOSCOPY;  Service: Endoscopy;  Laterality: N/A;  . ESOPHAGOGASTRODUODENOSCOPY (EGD) WITH PROPOFOL N/A 02/07/2018   Procedure: ESOPHAGOGASTRODUODENOSCOPY (EGD) WITH PROPOFOL;  Surgeon: [name], MD;  [facility];  Service: Gastroenterology;  Laterality: N/A;  APC and clips placed  . EYE SURGERY Bilateral    laser surgery  . LOWER EXTREMITY ANGIOGRAPHY Bilateral 11/30/2018   Procedure: LOWER EXTREMITY ANGIOGRAPHY;  Surgeon: [name], MD;  Location: [place] [facility];  Service: Cardiovascular;  Laterality: Bilateral;  . PARS PLANA VITRECTOMY Right 05/05/2017   Procedure: PARS PLANA VITRECTOMY WITH 25 GAUGE; PARTIAL REMOVAL OF OIL; INFERIOR PERIPHERAL IRIDECTOMY, REFORM ANTERIOR CHAMBER RIGHT EYE;  Surgeon: [name], MD;  Location: [place];  Service: Ophthalmology;  Laterality: Right;  . PERIPHERAL VASCULAR BALLOON ANGIOPLASTY Right 09/21/2018   Procedure: PERIPHERAL VASCULAR BALLOON ANGIOPLASTY;  Surgeon: [name], MD;  Location: [place] [facility];  Service: Cardiovascular;  Laterality: Right;  AV fistula  . PERIPHERAL VASCULAR BALLOON ANGIOPLASTY Left 11/30/2018   Procedure: PERIPHERAL VASCULAR BALLOON ANGIOPLASTY;  Surgeon: [name], MD;  Location: [place] [facility];  Service: Cardiovascular;  Laterality: Left;  Left Anterior Tibial Artery  . REFRACTIVE SURGERY Bilateral   . REMOVAL OF A DIALYSIS CATHETER Right 06/06/2013   Procedure: REMOVAL OF RIGHT MEDIPORT;  Surgeon: Conrad Wasilla, MD;  Location: [place];  Service: Vascular;  Laterality: Right;  . TONSILLECTOMY    . UPPER GASTROINTESTINAL ENDOSCOPY    . WOUND DEBRIDEMENT Left 09/30/2019   Procedure: IRRIGATION AND DEBRIDEMENT WOUND OF LEFT BELOW KNEE AMPUTATION;  Surgeon: [name], MD;   Location: [place];  Service: Vascular;  Laterality: Left;     reports that she has never smoked. She has never used smokeless tobacco. She reports that she does not drink alcohol or use drugs.  Allergies  Allergen Reactions  . Tape Itching and Rash    35M Transpore adhesive tape. Medical tape pulls off the skin!! PAPER TAPE ONLY!!  . Latex Hives  . Oxycodone Other (See Comments)    Hallucinations   . Tramadol Other (See Comments)    Hallucinations with a full tablet  . Morphine And Related Other (See Comments)    Hallucinations   . Vicodin [Hydrocodone-Acetaminophen] Other (See Comments)    hallucinations    Family History  Problem Relation Age of Onset  . Diabetes Mother   . Hyperlipidemia Mother   . Hypertension Mother   . Hypertension Father   . Diabetes Sister   . Diabetes Brother   . Hypertension Brother   . Heart attack Brother   . Kidney disease Brother   .  Colon cancer Neg Hx   . Colon polyps Neg Hx   . Esophageal cancer Neg Hx   . Gallbladder disease Neg Hx   . Rectal cancer Neg Hx   . Stomach cancer Neg Hx      Prior to Admission medications   Medication Sig Start Date End Date Taking? Authorizing Provider  acetaminophen (TYLENOL) 500 MG tablet Take 1,000 mg by mouth 4 (four) times daily.    Yes [provider]  B Complex-C-Folic Acid (RENA-VITE RX) 1 MG TABS Take 1 tablet by mouth daily with breakfast.  09/29/16  Yes [provider]  insulin aspart (NOVOLOG FLEXPEN) 100 UNIT/ML FlexPen Inject 6-10 Units into the skin 3 (three) times daily with meals. per Sliding scale   Yes [provider]  traMADol (ULTRAM) 50 MG tablet Take 1 tablet (50 mg total) by mouth every 6 (six) hours as needed. Patient taking differently: Take 25 mg by mouth every 3 (three) hours.  10/01/19  Yes Ulyses Amor, PA-C  albuterol (PROVENTIL) (2.5 MG/3ML) 0.083% nebulizer solution Take 3 mLs (2.5 mg total) by nebulization every 6 (six) hours as needed for  wheezing or shortness of breath. 10/30/16   Theodis Blaze, MD  albuterol (VENTOLIN HFA) 108 (90 Base) MCG/ACT inhaler Inhale into the lungs every 6 (six) hours as needed for wheezing or shortness of breath.    [provider]  Alcohol Swabs (B-D SINGLE USE SWABS REGULAR) PADS USE UTD FOR INSULIN INJECTIONS TID 03/08/19   [provider]  amLODipine (NORVASC) 5 MG tablet Take 5 mg by mouth daily as needed (low blood pressure).  05/21/18   [provider]  aspirin EC 81 MG tablet Take 81 mg by mouth daily.    [provider]  atropine 1 % ophthalmic solution Place 1 drop into the right eye 2 (two) times daily. 07/04/17   [provider]  BD PEN NEEDLE NANO 2ND GEN 32G X 4 MM MISC USE WITH INSULIN PEN 3 TIMES A DAY 03/03/19   [provider]  carvedilol (COREG) 25 MG tablet Take 25 mg by mouth 2 (two) times daily. 04/03/18   [provider]  clopidogrel (PLAVIX) 75 MG tablet Take 1 tablet (75 mg total) by mouth daily. 11/30/18   [name], MD  darbepoetin (ARANESP) 200 MCG/0.4ML SOLN injection Inject 0.4 mLs (200 mcg total) into the vein every Wednesday with hemodialysis. 06/22/13   Geradine Girt, DO  diclofenac sodium (VOLTAREN) 1 % GEL Apply 1 application topically 4 (four) times daily as needed (pain.).  04/28/19   [provider]  docusate sodium (COLACE) 100 MG capsule Take 100 mg by mouth daily as needed for mild constipation.     [provider]  doxercalciferol (HECTOROL) 4 MCG/2ML injection Inject 0.5 mLs (1 mcg total) into the vein every Monday, Wednesday, and Friday with hemodialysis. 06/18/13   Geradine Girt, DO  fluticasone (FLONASE) 50 MCG/ACT nasal spray Place 1-2 sprays into both nostrils daily as needed for allergies.  02/07/19   [provider]  gabapentin (NEURONTIN) 100 MG capsule Take 100 mg by mouth 2 (two) times daily.    [provider]  lanthanum (FOSRENOL) 1000 MG chewable  tablet Chew 1,000-3,000 mg by mouth See admin instructions. Take 4000 mg)by mouth with each meal & take 1000 mg by mouth with each snack    [provider]  lisinopril (PRINIVIL,ZESTRIL) 40 MG tablet Take 40 mg by mouth at bedtime.  [provider]  mupirocin ointment (BACTROBAN) 2 % Apply to affected area right foot once daily Patient taking differently: Apply 1 application topically daily. Apply to affected area right foot once daily 08/16/19   Marzetta Board, DPM  omeprazole (PRILOSEC) 40 MG capsule Take 1 capsule (40 mg total) by mouth daily. Patient taking differently: Take 40 mg by mouth 2 (two) times daily.  02/09/18   Cherene Altes, MD  [facility] VERIO test strip 3 (three) times daily. for testing 12/15/17   [provider]  prednisoLONE acetate (PRED FORTE) 1 % ophthalmic suspension Place 1 drop into the right eye 2 (two) times daily.  04/27/17   [provider]  traMADol (ULTRAM) 50 MG tablet TAKE 1 TABLET(50 MG) BY MOUTH EVERY 6 HOURS AS NEEDED FOR MODERATE PAIN Patient not taking: No sig reported 09/26/19   Angelia Mould, MD    Physical Exam: Vitals:   10/19/19 1936 10/19/19 1939 10/19/19 2116 10/19/19 2117  BP: (!) 102/51  (!) 109/33   Pulse: 72  73   Resp: 18  16   Temp: 98.2 F (36.8 C)     SpO2: 94%  96%   Weight:  86.2 kg  80 kg  Height:  5\' 5"  (1.651 m)  5\' 5"  (1.651 m)    Constitutional: NAD, calm, comfortable Eyes: Blind both eyes. ENMT: Mucous membranes are moist. Posterior pharynx clear of any exudate or lesions.Normal dentition.  Neck: normal, supple, no masses, no thyromegaly Respiratory: clear to auscultation bilaterally, no wheezing, no crackles. Normal respiratory effort. No accessory muscle use.  Cardiovascular: Regular rate and rhythm, no murmurs / rubs / gallops. No extremity edema. 2+ pedal pulses. No carotid bruits.  Abdomen: no tenderness, no masses palpated. No hepatosplenomegaly. Bowel sounds  positive.  Musculoskeletal: no clubbing / cyanosis. No joint deformity upper and lower extremities. Good ROM, no contractures. Normal muscle tone.  Skin:      Neurologic: CN 2-12 grossly intact. Sensation intact, DTR normal. Strength 5/5 in all 4.  Psychiatric: Normal judgment and insight. Alert and oriented x 3. Normal mood.    Labs on Admission: I have personally reviewed following labs and imaging studies  CBC: Recent Labs  Lab 10/19/19 1957  WBC 11.3*  NEUTROABS 7.5  HGB 8.6*  HCT 26.8*  MCV 95.7  PLT 161   Basic Metabolic Panel: Recent Labs  Lab 10/19/19 1957  NA 138  K 3.3*  CL 93*  CO2 30  GLUCOSE 147*  BUN 12  CREATININE 4.35*  CALCIUM 8.2*   GFR: Estimated Creatinine Clearance: 13.3 mL/min (A) (by C-G formula based on SCr of 4.35 mg/dL (H)). Liver Function Tests: Recent Labs  Lab 10/19/19 1957  AST 31  ALT 22  ALKPHOS 212*  BILITOT 1.0  PROT 6.5  ALBUMIN 2.7*   No results for input(s): LIPASE, AMYLASE in the last 168 hours. No results for input(s): AMMONIA in the last 168 hours. Coagulation Profile: No results for input(s): INR, PROTIME in the last 168 hours. Cardiac Enzymes: No results for input(s): CKTOTAL, CKMB, CKMBINDEX, TROPONINI in the last 168 hours. BNP (last 3 results) No results for input(s): PROBNP in the last 8760 hours. HbA1C: No results for input(s): HGBA1C in the last 72 hours. CBG: No results for input(s): GLUCAP in the last 168 hours. Lipid Profile: No results for input(s): CHOL, HDL, LDLCALC, TRIG, CHOLHDL, LDLDIRECT in the last 72 hours. Thyroid Function Tests: No results for input(s): TSH, T4TOTAL, FREET4, T3FREE, THYROIDAB in the  last 72 hours. Anemia Panel: No results for input(s): VITAMINB12, FOLATE, FERRITIN, TIBC, IRON, RETICCTPCT in the last 72 hours. Urine analysis:    Component Value Date/Time   COLORURINE YELLOW 02/06/2018 1709   APPEARANCEUR HAZY (A) 02/06/2018 1709   LABSPEC 1.013 02/06/2018 1709    LABSPEC 1.025 02/13/2011 1533   PHURINE 8.0 02/06/2018 1709   GLUCOSEU 50 (A) 02/06/2018 1709   HGBUR NEGATIVE 02/06/2018 1709   BILIRUBINUR NEGATIVE 02/06/2018 1709   BILIRUBINUR Negative 02/13/2011 1533   KETONESUR NEGATIVE 02/06/2018 1709   PROTEINUR 100 (A) 02/06/2018 1709   UROBILINOGEN 0.2 04/28/2011 1045   NITRITE NEGATIVE 02/06/2018 1709   LEUKOCYTESUR NEGATIVE 02/06/2018 1709   LEUKOCYTESUR Negative 02/13/2011 1533    Radiological Exams on Admission: DG Knee Complete 4 Views Left  Result Date: 10/19/2019 CLINICAL DATA:  Left below-knee amputation, soft tissue infection EXAM: LEFT KNEE - COMPLETE 4+ VIEW COMPARISON:  None. FINDINGS: Frontal, bilateral oblique, lateral views of the left knee are obtained. Prior left below-knee amputation. Sharp margin of the tibial amputation site. Slight irregularity at the fibular amputation site with small ossific density displaced inferiorly, likely related to previous surgery. There is extensive soft tissue swelling of the amputation stump. Extensive vascular calcifications are seen. No joint effusion. No acute fractures. IMPRESSION: 1. Extensive soft tissue swelling at the left below-knee amputation site. 2. No acute fracture. Electronically Signed   By: Randa Ngo M.D.   On: 10/19/2019 21:42    EKG: Independently reviewed.  Assessment/Plan Principal Problem:   Infection of amputation stump, left lower extremity (HCC) Active Problems:   Essential hypertension   ESRD on dialysis ([place])   Blindness of both eyes   Chronic combined systolic and diastolic CHF (congestive heart failure) (HCC)   Anemia in ESRD (end-stage renal disease) ([place])   Type 2 diabetes mellitus with diabetic polyneuropathy ([place])    1. Infection of LLE BKA stump - 1. LE wound pathway 2. Vasc surg to see in AM 1. NPO after MN 3. Wound care consult 4. Cont zosyn 5. Add Vanc 6. Wound culture 7. Repeat labs in AM 8. Holding ASA/Plavix for possible  surgery. 2. ESRD - 1. Call nephro in AM for routine dialysis during IP stay 3. Anemia in ESRD - 1. Chronic and stable 4. DM2 - 1. Hold home SSI 2. Use very sensitive SSI Q4H here 5. HTN - 1. BP a bit on the low side here in ED: 542H systolic. 2. Med rec pending 3. May end up holding BP meds for the moment and seeing how she does overnight.  DVT prophylaxis: SCD Code Status: Full Family Communication: Family at bedside Disposition Plan: Home after admit Consults called: Vasc surg Admission status: Admit to inpatient  Severity of Illness: The appropriate patient status for this patient is INPATIENT. Inpatient status is judged to be reasonable and necessary in order to provide the required intensity of service to ensure the patient's safety. The patient's presenting symptoms, physical exam findings, and initial radiographic and laboratory data in the context of their chronic comorbidities is felt to place them at high risk for further clinical deterioration. Furthermore, it is not anticipated that the patient will be medically stable for discharge from the hospital within 2 midnights of admission. The following factors support the patient status of inpatient.   IP status due to limb threatening infection of BKA stump.  Suspected to need further surgical intervention.   * I certify that at the point of admission it is my  clinical judgment that the patient will require inpatient hospital care spanning beyond 2 midnights from the point of admission due to high intensity of service, high risk for further deterioration and high frequency of surveillance required.*    Donal Lynam M. DO [facility]  How to contact the [facility] Attending or Consulting provider [place] or covering provider during after hours [place], for this patient?  1. Check the care team in [facility] and look for a) attending/consulting [facility] provider listed and b) the [facility] team listed 2. Log into [email or web address]  Amion  Physician Scheduling and messaging for groups and whole hospitals  On call and physician scheduling software for group practices, residents, hospitalists and other medical providers for call, clinic, rotation and shift schedules. On[employer] is a hospital-wide system for scheduling doctors and paging doctors on call. EasyPlot is for scientific plotting and data analysis.  [email or web address]  and use [place]'s universal password to access. If you do not have the password, please contact the hospital operator.  3. Locate the [facility] provider you are looking for under [facility] and page to a number that you can be directly reached. 4. If you still have difficulty reaching the provider, please page the [facility] (Director on Call) for the Hospitalists listed on amion for assistance.  10/19/2019, 10:33 PM

## 2019-10-19 NOTE — Progress Notes (Addendum)
Pharmacy Antibiotic Note  [name] is a 69 y.o. female admitted on 10/19/2019 with LLE wound infection (s/p BKA).  Pharmacy has been consulted for zosyn dosing.  ESRD - HD  Plan: Zosyn 2.25g IV every 8 hours  Monitor HD schedule, Cx and LOT  Addendum: Now consulted to add vancomycin Vancomycin 2000 mg IV x 1, then 750 mg IV qHD  Height: 5\' 5"  (165.1 cm) Weight: 176 lb 5.9 oz (80 kg) IBW/kg (Calculated) : 57  Temp (24hrs), Avg:98.2 F (36.8 C), Min:98.2 F (36.8 C), Max:98.2 F (36.8 C)  Recent Labs  Lab 10/19/19 1957  WBC 11.3*  CREATININE 4.35*  LATICACIDVEN 2.3*    Estimated Creatinine Clearance: 13.3 mL/min (A) (by C-G formula based on SCr of 4.35 mg/dL (H)).    Allergies  Allergen Reactions  . Latex Hives  . Oxycodone Other (See Comments)    Hallucinations   . Tramadol Other (See Comments)    hallucinations   . Tape Rash    65M Transpore adhesive tape. Medical tape  . Vicodin [Hydrocodone-Acetaminophen] Other (See Comments)    hallucinations    Bertis Ruddy, PharmD Clinical Pharmacist ED Pharmacist Phone # [phone] 10/19/2019 9:47 PM

## 2019-10-19 NOTE — ED Provider Notes (Signed)
[facility] EMERGENCY DEPARTMENT Provider Note   CSN: [id number] Arrival date & time: 10/19/19  1926     History Chief Complaint  Patient presents with  . Leg Infection    [name] is a 69 y.o. female.  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  Patient has noted increasing pain, drainage, odor over the past several days.  No fevers, nausea, vomiting, or diarrhea.  Per epic, it looks like patient received dose of antibiotics at dialysis today due to the appearance of her wound and that she was supposed to follow in vascular clinic tomorrow.  Due to appearance, sent to the ED for further evaluation.        Past Medical History:  Diagnosis Date  . Anemia   . Anxiety   . Arthritis    "joints" (06/15/2013)  . Asthma   . 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 Blood transfusion without reported diagnosis   . Breast cancer ([place])    left  . CHF (congestive heart failure) ([place])   . 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 Esophageal ulcer with bleeding   . ESRD (end stage renal disease) ([place])    "suppose to start dialysis today" (06/15/2013)  . Family history of adverse reaction to anesthesia    It took a while for pt sister to wake from anesthesia  . GERD (gastroesophageal reflux disease)   . Glaucoma    blind in both eyes  . Heart murmur    Mild AS, moderate MR, moderate TR 10/30/16 echo  . Hx of adenomatous colonic polyps 04/07/2018  . Hypertension   . Myalgia 12/31/2011  . Neuropathy 12/31/2011  . PAD (peripheral artery  disease) (HCC)    nonviable tissue left lower extremity  . Shortness of breath    "when she doesn't go to dialysis"  . Type II diabetes mellitus (HCC)    Type II  . Walker as ambulation aid     Patient Active Problem List   Diagnosis Date Noted  . PAD (peripheral artery disease) ([place]) 09/30/2019  . Gangrene of foot ([place]) 08/30/2019  . Other fatigue 02/28/2019  . Finger pain, left 02/03/2019  . Trigger ring finger of left hand 02/03/2019  . Osteomyelitis, unspecified ([place]) 11/16/2018  . Diabetic retinopathy associated with type 2 diabetes mellitus ([place]) 11/15/2018  . Rheumatoid factor positive 11/15/2018  . Anemia in ESRD (end-stage renal disease) ([place]) 05/06/2018  . Asthma 05/06/2018  . Hx of adenomatous colonic polyps 04/07/2018  . Status post carpal tunnel release 12/10/2017  . Carpal tunnel syndrome, left upper limb 11/10/2017  . Carpal tunnel syndrome, right upper limb 11/10/2017  . Bilateral hand numbness 09/28/2017  . Dependence on renal dialysis ([place]) 09/11/2017  . Abdominal discomfort, epigastric   . Hypertension 07/06/2017  . Chronic combined systolic and diastolic CHF (congestive heart failure) ([place]) 07/06/2017  . Complicated migraine 79/08/4095  . ESRD on dialysis ([place]) 06/04/2017  . Blindness of both eyes 06/04/2017  . Glaucoma 06/04/2017  . Essential hypertension 10/27/2016  . GERD (gastroesophageal reflux disease) 10/27/2016  . Hypercalcemia 05/28/2015  .  Abnormal stress test 03/15/2015  . Poor venous access 02/08/2015  . Breast cancer ([place]) 11/02/2014  . Diarrhea, unspecified 11/02/2014  . Fever, unspecified 11/02/2014  . Pain, unspecified 11/02/2014  . Pruritus, unspecified 11/02/2014  . Shortness of breath 07/28/2014  . Acquired absence of eye 03/31/2014  . Coagulation defect, unspecified ([place]) 03/22/2014  . Dysuria 02/14/2014  . Encounter for immunization 01/03/2014  . Iron deficiency anemia, unspecified 10/18/2013  . Unspecified protein-calorie  malnutrition ([place]) 09/23/2013  . Complication of vascular dialysis catheter 06/21/2013  . Fibromyalgia 06/21/2013  . Personal history of breast cancer 06/21/2013  . Personal history of other diseases of the musculoskeletal system and connective tissue 06/21/2013  . Type 2 diabetes mellitus with diabetic polyneuropathy ([place]) 06/21/2013  . Secondary hyperparathyroidism of renal origin ([place]) 06/20/2013  . Unspecified complication of cardiac and vascular prosthetic device, implant and graft, subsequent encounter 06/20/2013  . Type II diabetes mellitus with renal manifestations ([place]) 06/15/2013  . Chronic kidney disease (CKD), stage IV (severe) ([place]) 06/03/2013  . Neuropathy 12/31/2011  . Breast cancer of upper-inner quadrant of left female breast ([place]) 06/27/2011    Past Surgical History:  Procedure Laterality Date  . A/V FISTULAGRAM N/A 09/21/2018   Procedure: A/V FISTULAGRAM - Right Upper;  Surgeon: [name], MD;  Location: [facility] CV LAB;  Service: Cardiovascular;  Laterality: N/A;  . ABDOMINAL AORTOGRAM N/A 11/30/2018   Procedure: ABDOMINAL AORTOGRAM;  Surgeon: [name], MD;  Location: [facility];  Service: Cardiovascular;  Laterality: N/A;  . ABDOMINAL AORTOGRAM W/LOWER EXTREMITY Left 07/29/2019   Procedure: ABDOMINAL AORTOGRAM W/LOWER EXTREMITY;  Surgeon: [name], MD;  Location: [facility] LAB;  Service: Cardiovascular;  Laterality: Left;  . ABDOMINAL HYSTERECTOMY     partial  . AMPUTATION Left 08/30/2019   Procedure: AMPUTATION BELOW KNEE LEFT;  Surgeon: [name], MD;  Location: [facility];  Service: Vascular;  Laterality: Left;  . AV FISTULA PLACEMENT Right 06/06/2013   Procedure: ARTERIOVENOUS (AV) FISTULA CREATION-RIGHT BRACHIAL CEPHALIC;  Surgeon: Conrad Darwin, MD;  Location: [facility];  Service: Vascular;  Laterality: Right;  . BREAST BIOPSY Left   . BREAST LUMPECTOMY Left    "and took out some lymph nodes" (06/15/2013)  . CARDIAC  CATHETERIZATION     04/02/15 [facility]): no angiographic CAD, LVEF 40% with global hypokinesis (LHC done for + stress echo, EF 45% 11/28/14)  . CARPAL TUNNEL RELEASE Left 11/26/2017   Procedure: LEFT CARPAL TUNNEL RELEASE;  Surgeon: [name], MD;  Location: [facility];  Service: Orthopedics;  Laterality: Left;  . CATARACT EXTRACTION W/ ANTERIOR VITRECTOMY Bilateral   . CESAREAN SECTION  1980  . COLONOSCOPY    . ESOPHAGOGASTRODUODENOSCOPY N/A 07/08/2017   Procedure: ESOPHAGOGASTRODUODENOSCOPY (EGD);  Surgeon: [name], MD;  Location: [facility] ENDOSCOPY;  Service: Endoscopy;  Laterality: N/A;  . ESOPHAGOGASTRODUODENOSCOPY (EGD) WITH PROPOFOL N/A 02/07/2018   Procedure: ESOPHAGOGASTRODUODENOSCOPY (EGD) WITH PROPOFOL;  Surgeon: [name], MD;  Location: [facility] ENDOSCOPY;  Service: Gastroenterology;  Laterality: N/A;  APC and clips placed  . EYE SURGERY Bilateral    laser surgery  . LOWER EXTREMITY ANGIOGRAPHY Bilateral 11/30/2018   Procedure: LOWER EXTREMITY ANGIOGRAPHY;  Surgeon: [name], MD;  Location: [facility] CV LAB;  Service: Cardiovascular;  Laterality: Bilateral;  . PARS PLANA VITRECTOMY Right 05/05/2017   Procedure: PARS PLANA VITRECTOMY WITH 25 GAUGE; PARTIAL REMOVAL OF OIL; INFERIOR PERIPHERAL IRIDECTOMY, REFORM ANTERIOR CHAMBER RIGHT EYE;  Surgeon: [name], MD;  Location: [facility] OR;  Service: Ophthalmology;  Laterality: Right;  . PERIPHERAL VASCULAR BALLOON ANGIOPLASTY Right 09/21/2018   Procedure: PERIPHERAL VASCULAR BALLOON ANGIOPLASTY;  Surgeon: [name], MD;  Location: [facility] LAB;  Service: Cardiovascular;  Laterality: Right;  AV fistula  . PERIPHERAL VASCULAR BALLOON ANGIOPLASTY Left 11/30/2018   Procedure: PERIPHERAL VASCULAR BALLOON ANGIOPLASTY;  Surgeon: [name], MD;  Location: [facility] CV LAB;  Service: Cardiovascular;  Laterality: Left;  Left Anterior Tibial Artery  . REFRACTIVE SURGERY Bilateral   . REMOVAL OF A DIALYSIS CATHETER  Right 06/06/2013   Procedure: REMOVAL OF RIGHT MEDIPORT;  Surgeon: Conrad [place], MD;  Location: [facility];  Service: Vascular;  Laterality: Right;  . TONSILLECTOMY    . UPPER GASTROINTESTINAL ENDOSCOPY    . WOUND DEBRIDEMENT Left 09/30/2019   Procedure: IRRIGATION AND DEBRIDEMENT WOUND OF LEFT BELOW KNEE AMPUTATION;  Surgeon: [name], MD;  Location: [facility] OR;  Service: Vascular;  Laterality: Left;     OB History   No obstetric history on file.     Family History  Problem Relation Age of Onset  . Diabetes Mother   . Hyperlipidemia Mother   . Hypertension Mother   . Hypertension Father   . Diabetes Sister   . Diabetes Brother   . Hypertension Brother   . Heart attack Brother   . Kidney disease Brother   . Colon cancer Neg Hx   . Colon polyps Neg Hx   . Esophageal cancer Neg Hx   . Gallbladder disease Neg Hx   . Rectal cancer Neg Hx   . Stomach cancer Neg Hx     Social History   Tobacco Use  . Smoking status: Never Smoker  . Smokeless tobacco: Never Used  Substance Use Topics  . Alcohol use: No  . Drug use: No    Home Medications Prior to Admission medications   Medication Sig Start Date End Date Taking? Authorizing Provider  acetaminophen (TYLENOL) 500 MG tablet Take 1,000 mg by mouth every 6 (six) hours as needed for mild pain or moderate pain.     [provider]  albuterol (PROVENTIL) (2.5 MG/3ML) 0.083% nebulizer solution Take 3 mLs (2.5 mg total) by nebulization every 6 (six) hours as needed for wheezing or shortness of breath. 10/30/16   Theodis Blaze, MD  albuterol (VENTOLIN HFA) 108 (90 Base) MCG/ACT inhaler Inhale into the lungs every 6 (six) hours as needed for wheezing or shortness of breath.    [provider]  Alcohol Swabs (B-D SINGLE USE SWABS REGULAR) PADS USE UTD FOR INSULIN INJECTIONS TID 03/08/19   [provider]  amLODipine (NORVASC) 5 MG tablet Take 5 mg by mouth daily as needed (low blood pressure).  05/21/18   [provider]  aspirin EC 81 MG tablet Take 81 mg by mouth daily.    [provider]  atropine 1 % ophthalmic solution Place 1 drop into the right eye 2 (two) times daily. 07/04/17   [provider]  B Complex-C-Folic Acid (RENA-VITE RX) 1 MG TABS Take 1 tablet by mouth in the morning and at bedtime.  09/29/16   [provider]  BD PEN NEEDLE NANO 2ND GEN 32G X 4 MM MISC USE WITH INSULIN PEN 3 TIMES A DAY 03/03/19   [provider]  carvedilol (COREG) 25 MG tablet Take 25 mg by mouth 2 (two) times daily. 04/03/18   [provider]  clopidogrel (PLAVIX) 75 MG tablet  Take 1 tablet (75 mg total) by mouth daily. 11/30/18   [name], MD  darbepoetin (ARANESP) 200 MCG/0.4ML SOLN injection Inject 0.4 mLs (200 mcg total) into the vein every Wednesday with hemodialysis. 06/22/13   Geradine Girt, DO  diclofenac sodium (VOLTAREN) 1 % GEL Apply 1 application topically 4 (four) times daily as needed (pain.).  04/28/19   [provider]  docusate sodium (COLACE) 100 MG capsule Take 100 mg by mouth daily as needed for mild constipation.     [provider]  doxercalciferol (HECTOROL) 4 MCG/2ML injection Inject 0.5 mLs (1 mcg total) into the vein every Monday, Wednesday, and Friday with hemodialysis. 06/18/13   Geradine Girt, DO  fluticasone (FLONASE) 50 MCG/ACT nasal spray Place 1-2 sprays into both nostrils daily as needed for allergies.  02/07/19   [provider]  gabapentin (NEURONTIN) 100 MG capsule Take 100 mg by mouth 2 (two) times daily.    [provider]  insulin aspart (NOVOLOG FLEXPEN) 100 UNIT/ML FlexPen Inject 6-10 Units into the skin 3 (three) times daily with meals. per Sliding scale    [provider]  lanthanum (FOSRENOL) 1000 MG chewable tablet Chew 1,000-3,000 mg by mouth See admin instructions. Take 4000 mg)by mouth with each meal & take 1000 mg by mouth with each snack    [provider]    lisinopril (PRINIVIL,ZESTRIL) 40 MG tablet Take 40 mg by mouth at bedtime.     [provider]  mupirocin ointment (BACTROBAN) 2 % Apply to affected area right foot once daily Patient taking differently: Apply 1 application topically daily. Apply to affected area right foot once daily 08/16/19   Marzetta Board, DPM  omeprazole (PRILOSEC) 40 MG capsule Take 1 capsule (40 mg total) by mouth daily. Patient taking differently: Take 40 mg by mouth 2 (two) times daily.  02/09/18   Cherene Altes, MD  [facility] VERIO test strip 3 (three) times daily. for testing 12/15/17   [provider]  prednisoLONE acetate (PRED FORTE) 1 % ophthalmic suspension Place 1 drop into the right eye 2 (two) times daily.  04/27/17   [provider]  traMADol (ULTRAM) 50 MG tablet TAKE 1 TABLET(50 MG) BY MOUTH EVERY 6 HOURS AS NEEDED FOR MODERATE PAIN Patient taking differently: Take 50 mg by mouth every 6 (six) hours as needed for moderate pain.  09/26/19   Angelia Mould, MD  traMADol (ULTRAM) 50 MG tablet Take 1 tablet (50 mg total) by mouth every 6 (six) hours as needed. 10/01/19   Ulyses Amor, PA-C    Allergies    Latex, Oxycodone, Tramadol, Tape, and Vicodin [hydrocodone-acetaminophen]  Review of Systems   Review of Systems  Constitutional: Negative for fever.  HENT: Negative for rhinorrhea and sore throat.   Eyes: Negative for redness.  Respiratory: Negative for cough.   Cardiovascular: Negative for chest pain.  Gastrointestinal: Positive for nausea. Negative for abdominal pain, diarrhea and vomiting.  Genitourinary: Negative for dysuria.  Musculoskeletal: Positive for myalgias.  Skin: Positive for wound. Negative for rash.  Neurological: Negative for headaches.    Physical Exam Updated Vital Signs BP (!) 102/51 (BP Location: Right Arm)   Pulse 72   Temp 98.2 F (36.8 C)   Resp 18   Ht 5\' 5"  (1.651 m)   Wt 86.2 kg   LMP  (LMP Unknown)   SpO2 94%   BMI 31.62  kg/m   Physical Exam Vitals and nursing note reviewed.  Constitutional:      Appearance: She is well-developed.  HENT:     Head: Normocephalic and atraumatic.  Eyes:     General:        Right eye: No discharge.        Left eye: No discharge.     Conjunctiva/sclera: Conjunctivae normal.  Cardiovascular:     Rate and Rhythm: Normal rate and regular rhythm.     Heart sounds: Normal heart sounds.  Pulmonary:     Effort: Pulmonary effort is normal.     Breath sounds: Normal breath sounds.  Abdominal:     Palpations: Abdomen is soft.     Tenderness: There is no abdominal tenderness.  Musculoskeletal:     Cervical back: Normal range of motion and neck supple.     Comments: R UE dialysis access, bandaged  Skin:    General: Skin is warm and dry.     Comments: Large open wound overlying L BKA. Some odor. No large amount of pus noted.   Neurological:     Mental Status: She is alert.              ED Results / Procedures / Treatments   Labs (all labs ordered are listed, but only abnormal results are displayed) Labs Reviewed  COMPREHENSIVE METABOLIC PANEL - Abnormal; Notable for the following components:      Result Value   Potassium 3.3 (*)    Chloride 93 (*)    Glucose, Bld 147 (*)    Creatinine, Ser 4.35 (*)    Calcium 8.2 (*)    Albumin 2.7 (*)    Alkaline Phosphatase 212 (*)    GFR calc non Af Amer 10 (*)    GFR calc Af Amer 11 (*)    All other components within normal limits  LACTIC ACID, PLASMA - Abnormal; Notable for the following components:   Lactic Acid, Venous 2.3 (*)    All other components within normal limits  CBC WITH DIFFERENTIAL/PLATELET - Abnormal; Notable for the following components:   WBC 11.3 (*)    RBC 2.80 (*)    Hemoglobin 8.6 (*)    HCT 26.8 (*)    RDW 20.8 (*)    All other components within normal limits  CULTURE, BLOOD (ROUTINE X 2)  CULTURE, BLOOD (ROUTINE X 2)  SARS CORONAVIRUS 2 (TAT 6-24 HRS)  AEROBIC CULTURE (SUPERFICIAL  SPECIMEN)  LACTIC ACID, PLASMA    EKG None  Radiology DG Knee Complete 4 Views Left  Result Date: 10/19/2019 CLINICAL DATA:  Left below-knee amputation, soft tissue infection EXAM: LEFT KNEE - COMPLETE 4+ VIEW COMPARISON:  None. FINDINGS: Frontal, bilateral oblique, lateral views of the left knee are obtained. Prior left below-knee amputation. Sharp margin of the tibial amputation site. Slight irregularity at the fibular amputation site with small ossific density displaced inferiorly, likely related to previous surgery. There is extensive soft tissue swelling of the amputation stump. Extensive vascular calcifications are seen. No joint effusion. No acute fractures. IMPRESSION: 1. Extensive soft tissue swelling at the left below-knee amputation site. 2. No acute fracture. Electronically Signed   By: Randa Ngo M.D.   On: 10/19/2019 21:42    Procedures Procedures (including critical care time)  Medications Ordered in ED Medications  piperacillin-tazobactam (ZOSYN) IVPB 2.25 g (has no administration in time range)  fentaNYL (SUBLIMAZE) injection 25-50 mcg (has no administration in time range)  fentaNYL (SUBLIMAZE) injection 50 mcg (50 mcg Intravenous Given 10/19/19 2121)    ED Course  I have reviewed the triage vital signs and the nursing notes.  Pertinent labs & imaging results that were available during my care of the patient were reviewed by me and considered in my medical decision making (see chart for details).   Patient seen and examined. Work-up reviewed. I spoke with Dr. Donnetta Hutching who will look at imaging of the wound.   Vital signs reviewed and are as follows: BP (!) 109/33 (BP Location: Left Arm)   Pulse 73   Temp 98.2 F (36.8 C)   Resp 16   Ht 5\' 5"  (1.651 m)   Wt 80 kg   LMP  (LMP Unknown)   SpO2 96%   BMI 29.35 kg/m   Dr. Donnetta Hutching has reviewed imaging.  He feels that patient should be admitted for further treatment and consultation.  Given multiple medical  comorbidities, requests hospitalist admission, IV antibiotics.  Requests n.p.o. after midnight.  Plan for vascular consult in the a.m.  May need AKA.  Patient updated.  Zosyn ordered.  10:15 PM Spoke with Dr. Alcario Drought who will see.     MDM Rules/Calculators/A&P                      Admit.    Final Clinical Impression(s) / ED Diagnoses Final diagnoses:  Wound infection  Hx of BKA, left [facility])    Rx / DC Orders ED Discharge Orders    None       Carlisle Cater, Hershal Coria 10/19/19 2216    Quintella Reichert, MD 10/21/19 1012

## 2019-10-20 ENCOUNTER — Other Ambulatory Visit: Payer: Self-pay

## 2019-10-20 DIAGNOSIS — E1129 Type 2 diabetes mellitus with other diabetic kidney complication: Secondary | ICD-10-CM | POA: Diagnosis not present

## 2019-10-20 DIAGNOSIS — Z992 Dependence on renal dialysis: Secondary | ICD-10-CM | POA: Diagnosis not present

## 2019-10-20 DIAGNOSIS — N186 End stage renal disease: Secondary | ICD-10-CM | POA: Diagnosis not present

## 2019-10-20 LAB — GLUCOSE, CAPILLARY
Glucose-Capillary: 105 mg/dL — ABNORMAL HIGH (ref 70–99)
Glucose-Capillary: 120 mg/dL — ABNORMAL HIGH (ref 70–99)
Glucose-Capillary: 121 mg/dL — ABNORMAL HIGH (ref 70–99)
Glucose-Capillary: 125 mg/dL — ABNORMAL HIGH (ref 70–99)
Glucose-Capillary: 135 mg/dL — ABNORMAL HIGH (ref 70–99)
Glucose-Capillary: 149 mg/dL — ABNORMAL HIGH (ref 70–99)

## 2019-10-20 LAB — BASIC METABOLIC PANEL
Anion gap: 16 — ABNORMAL HIGH (ref 5–15)
BUN: 12 mg/dL (ref 8–23)
CO2: 30 mmol/L (ref 22–32)
Calcium: 8.3 mg/dL — ABNORMAL LOW (ref 8.9–10.3)
Chloride: 93 mmol/L — ABNORMAL LOW (ref 98–111)
Creatinine, Ser: 4.59 mg/dL — ABNORMAL HIGH (ref 0.44–1.00)
GFR calc Af Amer: 11 mL/min — ABNORMAL LOW (ref >60)
GFR calc non Af Amer: 9 mL/min — ABNORMAL LOW (ref >60)
Glucose, Bld: 137 mg/dL — ABNORMAL HIGH (ref 70–99)
Potassium: 3.5 mmol/L (ref 3.5–5.1)
Sodium: 139 mmol/L (ref 135–145)

## 2019-10-20 LAB — CBC
HCT: 26.8 % — ABNORMAL LOW (ref 36.0–46.0)
Hemoglobin: 8.4 g/dL — ABNORMAL LOW (ref 12.0–15.0)
MCH: 30 pg (ref 26.0–34.0)
MCHC: 31.3 g/dL (ref 30.0–36.0)
MCV: 95.7 fL (ref 80.0–100.0)
Platelets: 248 10*3/uL (ref 150–400)
RBC: 2.8 MIL/uL — ABNORMAL LOW (ref 3.87–5.11)
RDW: 20.9 % — ABNORMAL HIGH (ref 11.5–15.5)
WBC: 10.9 10*3/uL — ABNORMAL HIGH (ref 4.0–10.5)
nRBC: 0.2 % (ref 0.0–0.2)

## 2019-10-20 LAB — PREALBUMIN: Prealbumin: 15.8 mg/dL — ABNORMAL LOW (ref 18–38)

## 2019-10-20 LAB — HIV ANTIBODY (ROUTINE TESTING W REFLEX): HIV Screen 4th Generation wRfx: NONREACTIVE

## 2019-10-20 LAB — SEDIMENTATION RATE: Sed Rate: 22 mm/hr (ref 0–22)

## 2019-10-20 LAB — C-REACTIVE PROTEIN: CRP: 7.8 mg/dL — ABNORMAL HIGH (ref <1.0)

## 2019-10-20 LAB — SARS CORONAVIRUS 2 (TAT 6-24 HRS): SARS Coronavirus 2: NEGATIVE

## 2019-10-20 MED ORDER — DIPHENHYDRAMINE HCL 25 MG PO CAPS
25.0000 mg | ORAL_CAPSULE | Freq: Every day | ORAL | Status: DC
  Administered 2019-10-20 – 2019-10-23 (×4): 25 mg via ORAL
  Filled 2019-10-20 (×4): qty 1

## 2019-10-20 MED ORDER — ENOXAPARIN SODIUM 30 MG/0.3ML ~~LOC~~ SOLN
30.0000 mg | Freq: Once | SUBCUTANEOUS | Status: AC
  Administered 2019-10-20: 30 mg via SUBCUTANEOUS
  Filled 2019-10-20: qty 0.3

## 2019-10-20 MED ORDER — ALBUTEROL SULFATE HFA 108 (90 BASE) MCG/ACT IN AERS: 2.0000 | INHALATION_SPRAY | Freq: Four times a day (QID) | RESPIRATORY_TRACT | Status: DC | PRN

## 2019-10-20 MED ORDER — VANCOMYCIN VARIABLE DOSE PER UNSTABLE RENAL FUNCTION (PHARMACIST DOSING): Status: DC

## 2019-10-20 MED ORDER — PANTOPRAZOLE SODIUM 40 MG PO TBEC
80.0000 mg | DELAYED_RELEASE_TABLET | Freq: Two times a day (BID) | ORAL | Status: DC
  Administered 2019-10-20 – 2019-10-24 (×8): 80 mg via ORAL
  Filled 2019-10-20 (×8): qty 2

## 2019-10-20 MED ORDER — GABAPENTIN 100 MG PO CAPS
100.0000 mg | ORAL_CAPSULE | Freq: Two times a day (BID) | ORAL | Status: DC
  Administered 2019-10-20 – 2019-10-24 (×8): 100 mg via ORAL
  Filled 2019-10-20 (×9): qty 1

## 2019-10-20 MED ORDER — RENA-VITE PO TABS
1.0000 | ORAL_TABLET | Freq: Every day | ORAL | Status: DC
  Administered 2019-10-20 – 2019-10-23 (×4): 1 via ORAL
  Filled 2019-10-20 (×4): qty 1

## 2019-10-20 MED ORDER — VANCOMYCIN HCL IN DEXTROSE 750-5 MG/150ML-% IV SOLN
750.0000 mg | INTRAVENOUS | Status: AC
  Filled 2019-10-20: qty 150

## 2019-10-20 MED ORDER — ALBUTEROL SULFATE (2.5 MG/3ML) 0.083% IN NEBU: 2.5000 mg | INHALATION_SOLUTION | Freq: Four times a day (QID) | RESPIRATORY_TRACT | Status: DC | PRN

## 2019-10-20 MED ORDER — DULOXETINE HCL 30 MG PO CPEP
30.0000 mg | ORAL_CAPSULE | Freq: Every day | ORAL | Status: DC
  Administered 2019-10-20 – 2019-10-24 (×4): 30 mg via ORAL
  Filled 2019-10-20 (×4): qty 1

## 2019-10-20 MED ORDER — DOCUSATE SODIUM 100 MG PO CAPS
100.0000 mg | ORAL_CAPSULE | Freq: Two times a day (BID) | ORAL | Status: DC
  Administered 2019-10-20 – 2019-10-23 (×4): 100 mg via ORAL
  Filled 2019-10-20 (×7): qty 1

## 2019-10-20 MED ORDER — PREDNISOLONE ACETATE 1 % OP SUSP
1.0000 [drp] | Freq: Two times a day (BID) | OPHTHALMIC | Status: DC
  Administered 2019-10-20 – 2019-10-24 (×4): 1 [drp] via OPHTHALMIC
  Filled 2019-10-20 (×2): qty 5

## 2019-10-20 MED ORDER — NEPRO/CARBSTEADY PO LIQD
237.0000 mL | Freq: Two times a day (BID) | ORAL | Status: DC
  Administered 2019-10-20: 237 mL via ORAL

## 2019-10-20 MED ORDER — TRAMADOL HCL 50 MG PO TABS
25.0000 mg | ORAL_TABLET | ORAL | Status: DC
  Administered 2019-10-20 – 2019-10-24 (×21): 25 mg via ORAL
  Filled 2019-10-20 (×22): qty 1

## 2019-10-20 MED ORDER — DICLOFENAC SODIUM 1 % EX GEL
2.0000 g | Freq: Four times a day (QID) | CUTANEOUS | Status: DC
  Administered 2019-10-20 – 2019-10-24 (×11): 2 g via TOPICAL
  Filled 2019-10-20 (×2): qty 100

## 2019-10-20 MED ORDER — ATROPINE SULFATE 1 % OP SOLN
1.0000 [drp] | Freq: Two times a day (BID) | OPHTHALMIC | Status: DC
  Administered 2019-10-20 – 2019-10-24 (×6): 1 [drp] via OPHTHALMIC
  Filled 2019-10-20 (×2): qty 2

## 2019-10-20 MED ORDER — CHLORHEXIDINE GLUCONATE CLOTH 2 % EX PADS
6.0000 | MEDICATED_PAD | Freq: Every day | CUTANEOUS | Status: DC
  Administered 2019-10-21 – 2019-10-24 (×4): 6 via TOPICAL

## 2019-10-20 MED ORDER — PRO-STAT SUGAR FREE PO LIQD
30.0000 mL | Freq: Every day | ORAL | Status: DC
  Administered 2019-10-20 – 2019-10-23 (×4): 30 mL via ORAL
  Filled 2019-10-20 (×4): qty 30

## 2019-10-20 NOTE — Progress Notes (Signed)
Spoke with patient's sister by phone.  She is in agreement we should proceed with a left above-knee amputation.  The patient is having significant pain in the left below-knee amputation and at this point she would just like to get rid of the pain.  Of note she is having some rest pain in the right foot and her sister is realistic about the expectation that eventually she may come to amputation of the right leg and is not sure her sister would want to continue to go on living like that.  She was agreeable to doing the above-knee amputation for pain control.  I will speak with Dr. Donnetta Hutching and he will proceed with this tomorrow morning at 730.  Ruta Hinds, MD [facility] Office: [phone]

## 2019-10-20 NOTE — Consult Note (Signed)
[facility] Nurse Consult Note: Reason for Consult:Stump wound. WOC Nursing is simultaneously consulted with Vascular Surgery.  According to the ED Provider (Dr. Alvera Singh) note, Dr. Donnetta Hutching was consulted and reviewed images last evening. Vascular will see today and assess the need for operative intervention. Patient is currently NPO.   There is no role for WOC Nursing at this time.  [facility] nursing team will remain available to this patient, the nursing, surgical and medical teams.  Please re-consult if needed. Thanks, Maudie Flakes, MSN, RN, [place], [facility] Abbott  Pager# [phone]

## 2019-10-20 NOTE — Progress Notes (Signed)
PROGRESS NOTE    [name]  LHT:[id number] DOB: [birth year] DOA: 10/19/2019 PCP: Prince Solian, MD   Brief Narrative:   HPI: [name] is a 69 y.o. 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  Pt presents to ED due to wound not looking good at all at dialysis today.  Wound vac without good seal, odor and purulent drainage from wound.  Patient has noted increasing pain, drainage, odor over the past several days. No fevers, nausea, vomiting, or diarrhea. Per epic, it looks like patient received dose of antibiotics at dialysis today due to the appearance of her wound and that she was supposed to follow in vascular clinic tomorrow.   ED Course: WBC 11.3k, HGB 8.6 (stable from 8.7 earlier this month).  X ray of knee: extensive soft tissue swelling at BKA site.  Started on zosyn in ED.  Vasc surg: keep NPO after MN, they will eval in AM, sounds like they are leaning towards AKA.  Assessment & Plan:   Principal Problem:   Infection of amputation stump, left lower extremity ([facility]) Active Problems:   Essential hypertension   ESRD on dialysis ([place])   Blindness of both eyes   Chronic combined systolic and diastolic CHF (congestive heart failure) (HCC)   Anemia in ESRD (end-stage renal disease) ([place])   Type 2 diabetes mellitus with diabetic polyneuropathy ([place])   1. Infection of LLE BKA stump: Seen by general surgery.  Plan for either debridement or AKA tomorrow.  Will let her eat now.  Keep n.p.o. from midnight.  Continue Zosyn.  Follow wound culture.  Continue to hold aspirin and Plavix.  2. ESRD on HD MWF-consulted nephrology.  3. Anemia in ESRD -hemoglobin stable.  Monitor daily.  4. DM2 -blood sugar stable.  Continue SSI.  Not on any medications at home.  Hemoglobin A1c 6.0 on  10/19/2019.  5. Essential hypertension: Blood pressure controlled despite of holding home medications except amlodipine.  Continue to hold carvedilol, lisinopril,  DVT prophylaxis: Lovenox   Code Status: Full Code  Family Communication: Sister at bedside.  Plan of care discussed with patient in length and he verbalized understanding and agreed with it. Patient is from: Home Disposition Plan: To be determined Barriers to discharge: Pending surgical procedure by vascular surgery   Estimated body mass index is 29.35 kg/m as calculated from the following:   Height as of this encounter: 5\' 5"  (1.651 m).   Weight as of this encounter: 80 kg.  Pressure Injury 09/30/19 Heel Right Unstageable - Full thickness tissue loss in which the base of the injury is covered by slough (yellow, tan, gray, green or brown) and/or eschar (tan, brown or black) in the wound bed. black tender to touch small open r (Active)  09/30/19 2153  Location: Heel  Location Orientation: Right  Staging: Unstageable - Full thickness tissue loss in which the base of the injury is covered by slough (yellow, tan, gray, green or brown) and/or eschar (tan, brown or black) in the wound bed.  Wound Description (Comments): black tender to touch small open redden area  Present on Admission: Yes     Nutritional status:               Consultants:   Vascular surgery  Procedures:   None  Antimicrobials:   Zosyn and vancomycin  Subjective: Seen and examined.  At bedside.  She complains of pain in the right stump and left heel.  Objective: Vitals:   10/19/19 2300 10/20/19 0026 10/20/19 0423 10/20/19 0859  BP: (!) 111/42 (!) 113/37 (!) 113/35 (!) 110/34  Pulse: 69 67 70 72  Resp: 16 18 18 18   Temp:  98 F (36.7 C) 98.6 F (37 C) 98.8 F (37.1 C)  TempSrc:  Oral Oral Oral  SpO2: 96% 99% 98% 94%  Weight:      Height:        Intake/Output Summary (Last 24 hours) at 10/20/2019 1240 Last data filed at  10/20/2019 0900 Gross per 24 hour  Intake 0 ml  Output --  Net 0 ml   Filed Weights   10/19/19 1939 10/19/19 2117  Weight: 86.2 kg 80 kg    Examination:  General exam: Appears calm and comfortable, legally blind bilaterally Respiratory system: Clear to auscultation. Respiratory effort normal. Cardiovascular system: S1 & S2 heard, RRR. No JVD, murmurs, rubs, gallops or clicks. No pedal edema. Gastrointestinal system: Abdomen is nondistended, soft and nontender. No organomegaly or masses felt. Normal bowel sounds heard. Central nervous system: Alert and oriented. No focal neurological deficits. Extremities: Left BKA with healing stump wound with no discharge.  Has necrotic wound shallow ulcer at the right heel. Skin: No rashes, lesions or ulcers Psychiatry: Judgement and insight appear normal. Mood & affect appropriate.    Data Reviewed: I have personally reviewed following labs and imaging studies  CBC: Recent Labs  Lab 10/19/19 1957 10/20/19 0123  WBC 11.3* 10.9*  NEUTROABS 7.5  --   HGB 8.6* 8.4*  HCT 26.8* 26.8*  MCV 95.7 95.7  PLT 258 664   Basic Metabolic Panel: Recent Labs  Lab 10/19/19 1957 10/20/19 0123  NA 138 139  K 3.3* 3.5  CL 93* 93*  CO2 30 30  GLUCOSE 147* 137*  BUN 12 12  CREATININE 4.35* 4.59*  CALCIUM 8.2* 8.3*   GFR: Estimated Creatinine Clearance: 12.6 mL/min (A) (by C-G formula based on SCr of 4.59 mg/dL (H)). Liver Function Tests: Recent Labs  Lab 10/19/19 1957  AST 31  ALT 22  ALKPHOS 212*  BILITOT 1.0  PROT 6.5  ALBUMIN 2.7*   No results for input(s): LIPASE, AMYLASE in the last 168 hours. No results for input(s): AMMONIA in the last 168 hours. Coagulation Profile: No results for input(s): INR, PROTIME in the last 168 hours. Cardiac Enzymes: No results for input(s): CKTOTAL, CKMB, CKMBINDEX, TROPONINI in the last 168 hours. BNP (last 3 results) No results for input(s): PROBNP in the last 8760 hours. HbA1C: Recent Labs     10/19/19 2225  HGBA1C 6.0*   CBG: Recent Labs  Lab 10/20/19 0025 10/20/19 0539 10/20/19 0758 10/20/19 1128  GLUCAP 120* 121* 125* 105*   Lipid Profile: No results for input(s): CHOL, HDL, LDLCALC, TRIG, CHOLHDL, LDLDIRECT in the last 72 hours. Thyroid Function Tests: No results for input(s): TSH, T4TOTAL, FREET4, T3FREE, THYROIDAB in the last 72 hours. Anemia Panel: No results for input(s): VITAMINB12, FOLATE, FERRITIN, TIBC, IRON, RETICCTPCT in the last 72 hours. Sepsis Labs: Recent Labs  Lab 10/19/19 1957 10/19/19 2137  LATICACIDVEN 2.3* 1.6    Recent Results (from the past 240 hour(s))  Culture, blood (Routine x 2)     Status: None (Preliminary result)   Collection Time: 10/19/19  7:45 PM   Specimen: BLOOD RIGHT ARM  Result Value Ref Range Status   Specimen Description BLOOD RIGHT  ARM  Final   Special Requests   Final    BOTTLES DRAWN AEROBIC AND ANAEROBIC Blood Culture results may not be optimal due to an excessive volume of blood received in culture bottles   Culture   Final    NO GROWTH < 12 HOURS Performed at [facility] [address], [place], [place] [address]    Report Status PENDING  Incomplete  Culture, blood (Routine x 2)     Status: None (Preliminary result)   Collection Time: 10/19/19  7:59 PM   Specimen: BLOOD LEFT HAND  Result Value Ref Range Status   Specimen Description BLOOD LEFT HAND  Final   Special Requests   Final    BOTTLES DRAWN AEROBIC ONLY Blood Culture adequate volume   Culture   Final    NO GROWTH < 12 HOURS Performed at [facility], [place] [address] [place], [place] [address]    Report Status PENDING  Incomplete  SARS CORONAVIRUS 2 (TAT 6-24 HRS) Nasopharyngeal Nasopharyngeal Swab     Status: None   Collection Time: 10/19/19 10:12 PM   Specimen: Nasopharyngeal Swab  Result Value Ref Range Status   SARS Coronavirus 2 NEGATIVE NEGATIVE Final    Comment: (NOTE) SARS-CoV-2 target nucleic acids are NOT DETECTED. The  SARS-CoV-2 RNA is generally detectable in upper and lower respiratory specimens during the acute phase of infection. Negative results do not preclude SARS-CoV-2 infection, do not rule out co-infections with other pathogens, and should not be used as the sole basis for treatment or other patient management decisions. Negative results must be combined with clinical observations, patient history, and epidemiological information. The expected result is Negative. Fact Sheet for Patients: SugarRoll.be Fact Sheet for Healthcare Providers: https://www.woods-mathews.com/ This test is not yet approved or cleared by the [place] FDA and  has been authorized for detection and/or diagnosis of SARS-CoV-2 by FDA under an Emergency Use Authorization (EUA). This EUA will remain  in effect (meaning this test can be used) for the duration of the COVID-19 declaration under Section 56 4(b)(1) of the Act, 21 U.S.C. section 360bbb-3(b)(1), unless the authorization is terminated or revoked sooner. Performed at [facility], [place] [address], [place], [place] [address]       Radiology Studies: DG Knee Complete 4 Views Left  Result Date: 10/19/2019 CLINICAL DATA:  Left below-knee amputation, soft tissue infection EXAM: LEFT KNEE - COMPLETE 4+ VIEW COMPARISON:  None. FINDINGS: Frontal, bilateral oblique, lateral views of the left knee are obtained. Prior left below-knee amputation. Sharp margin of the tibial amputation site. Slight irregularity at the fibular amputation site with small ossific density displaced inferiorly, likely related to previous surgery. There is extensive soft tissue swelling of the amputation stump. Extensive vascular calcifications are seen. No joint effusion. No acute fractures. IMPRESSION: 1. Extensive soft tissue swelling at the left below-knee amputation site. 2. No acute fracture. Electronically Signed   By: Randa Ngo M.D.   On:  10/19/2019 21:42    Scheduled Meds: . atropine  1 drop Right Eye BID  . diclofenac Sodium  2 g Topical QID  . diphenhydrAMINE  25 mg Oral QHS  . docusate sodium  100 mg Oral BID WC  . DULoxetine  30 mg Oral Daily  . gabapentin  100 mg Oral BID  . insulin aspart  0-6 Units Subcutaneous Q4H  . pantoprazole  80 mg Oral BID  . prednisoLONE acetate  1 drop Right Eye BID  . traMADol  25 mg Oral Q3H   Continuous Infusions: . piperacillin-tazobactam (ZOSYN)  IV 2.25 g (10/20/19 0550)     LOS: 1 day   Time spent: 35 minutes   Darliss Cheney, MD [facility]  10/20/2019, 12:40 PM   To contact the attending provider between 7A-7P or the covering provider during after hours 7P-7A, please log into the web site [email or web address].

## 2019-10-20 NOTE — Progress Notes (Signed)
Initial Nutrition Assessment  DOCUMENTATION CODES:   Obesity unspecified  INTERVENTION:   -Nepro Shake po BID, each supplement provides 425 kcal and 19 grams protein -Renal MVI daily -30 ml Prostat daily, each supplement provides 100 kcals and 15 grams protein  NUTRITION DIAGNOSIS:   Increased nutrient needs related to wound healing as evidenced by estimated needs.  GOAL:   Patient will meet greater than or equal to 90% of their needs  MONITOR:   PO intake, Supplement acceptance, Labs, Weight trends, Skin, I & O's  REASON FOR ASSESSMENT:   Consult Wound healing  ASSESSMENT:   [name] is a 69 y.o. 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  Pt admitted with infection of LLE BKA stump.   Spoke with pt at bedside, who deferred most of the interview to her sister, who she resides with and prepares her meals. Pt has had a decreased appetite over the past few weeks, but still consumes 3 meals per day (Breakfast: eggs and grits; Lunch: half sandwich; Dinner: meat, starch, and vegetable). Pt sister also supplements diet with snacks (teddy grahams and vanilla wafers, which she enjoys). Documented meal completion 50%. Pt sister reports her home health nurse also recommended pt start on protein shakes- she has been consuming Nepro and another shake which they do not recall the name of. Sister describes this supplement as a liquid made with pea protein "that starts with an S". RD unable to identify which supplement this is, however, this is not available on the hospital formulary. Pt amenable to Nepro supplements. Discussed importance of good meal and supplement intake to promote healing.   Reviewed wt hx; noted pt has experienced a10.9% wt loss over the past month. RD suspect some wt loss may be related to amputation (tp underwent lt BKA on 08/30/19 with debridement on 09/30/19). Per vascular surgery notea, plan for lt  AKA tomorrow. No plan to operate on rt dry heel gangrene at this time, as this may pt pt further at risk for rt BKA.   Lab Results  Component Value Date   HGBA1C 6.0 (H) 10/19/2019   PTA DM medications are 6-10 units insulin aspart TID with meals.   Labs reviewed: CBGS: 120-125 (inpatient orders for glycemic control are 0-6 units insulin aspart every 4 hours).   NUTRITION - FOCUSED PHYSICAL EXAM:    Most Recent Value  Orbital Region  No depletion  Upper Arm Region  No depletion  Thoracic and Lumbar Region  No depletion  Buccal Region  No depletion  Temple Region  No depletion  Clavicle Bone Region  No depletion  Clavicle and Acromion Bone Region  No depletion  Scapular Bone Region  No depletion  Dorsal Hand  No depletion  Patellar Region  No depletion  Anterior Thigh Region  No depletion  Posterior Calf Region  No depletion  Edema (RD Assessment)  Moderate  Hair  Reviewed  Eyes  Reviewed  Mouth  Reviewed  Skin  Reviewed  Nails  Reviewed       Diet Order:   Diet Order            Diet NPO time specified Except for: Sips with Meds  Diet effective midnight        Diet Carb Modified Fluid consistency: Thin; Room service appropriate? Yes  Diet effective now              EDUCATION NEEDS:   Education  needs have been addressed  Skin:  Skin Assessment: Skin Integrity Issues: Skin Integrity Issues:: Unstageable, Other (Comment) Unstageable: rt heel Wound Vac: - Other: open wound to lt BKA site  Last BM:  Unknown  Height:   Ht Readings from Last 1 Encounters:  10/19/19 5\' 5"  (1.651 m)    Weight:   Wt Readings from Last 1 Encounters:  10/19/19 80 kg    Ideal Body Weight:  53.1 kg(adjusted for lt BKA)  BMI:  Body mass index is 29.35 kg/m.  Estimated Nutritional Needs:   Kcal:  1900-2100  Protein:  115-130 grams  Fluid:  1000 ml +UOP    Loistine Chance, RD, LDN, [place] Registered Dietitian II Certified Diabetes Care and Education Specialist Please  refer to [facility] for RD and/or RD on-call/weekend/after hours pager

## 2019-10-20 NOTE — Anesthesia Preprocedure Evaluation (Addendum)
Anesthesia Evaluation  Patient identified by MRN, date of birth, ID band Patient awake and Patient confused    Reviewed: Allergy & Precautions, NPO status , Patient's Chart, lab work & pertinent test results, reviewed documented beta blocker date and time   History of Anesthesia Complications Negative for: history of anesthetic complications  Airway Mallampati: II  TM Distance: >3 FB Neck ROM: Full    Dental  (+) Missing, Dental Advisory Given   Pulmonary COPD,  COPD inhaler,    breath sounds clear to auscultation       Cardiovascular hypertension, Pt. on home beta blockers and Pt. on medications (-) angina+ Peripheral Vascular Disease  + Valvular Problems/Murmurs AS and MR  Rhythm:Regular Rate:Normal  10/30/16 echo: moderate concentric LVH. EF 45 to 50%.   Diffuse hypokinesis, grade 2 diastolic dysfunction.  - Aortic valve: Valve mobility was restricted, mild AS with Peak velocity (S): 238 cm/s, Mean gradient (S): 13 mm Hg.  - Mitral valve: Severely calcified annulus, moderate MR directed posteriorly.  - Left atrium: The atrium was moderately dilated.  - Right ventricle: The cavity size was moderately dilated.  - Right atrium: The atrium was moderately dilated.  - Tricuspid valve:  moderate TR.    Neuro/Psych  Headaches, Anxiety Dementia glaucoma    GI/Hepatic Neg liver ROS, GERD  Medicated and Controlled,  Endo/Other  diabetes (glu 103), Insulin Dependentobese  Renal/GU Dialysis and ESRFRenal disease (K+ 3.9)     Musculoskeletal  (+) Arthritis , Fibromyalgia -  Abdominal (+) + obese,   Peds  Hematology  (+) Blood dyscrasia (Hb 8.4), anemia ,   Anesthesia Other Findings Breast cancer  Reproductive/Obstetrics                            Anesthesia Physical Anesthesia Plan  ASA: III  Anesthesia Plan: General   Post-op Pain Management:    Induction: Intravenous  PONV Risk Score  and Plan: 3 and Ondansetron, Dexamethasone and Treatment may vary due to age or medical condition  Airway Management Planned: LMA  Additional Equipment:   Intra-op Plan:   Post-operative Plan:   Informed Consent: I have reviewed the patients History and Physical, chart, labs and discussed the procedure including the risks, benefits and alternatives for the proposed anesthesia with the patient or authorized representative who has indicated his/her understanding and acceptance.     Dental advisory given and Consent reviewed with POA  Plan Discussed with: Surgeon and CRNA  Anesthesia Plan Comments: (Discussed with pt's daughter by telephone)       Anesthesia Quick Evaluation

## 2019-10-20 NOTE — H&P (View-Only) (Signed)
Spoke with patient's sister by phone.  She is in agreement we should proceed with a left above-knee amputation.  The patient is having significant pain in the left below-knee amputation and at this point she would just like to get rid of the pain.  Of note she is having some rest pain in the right foot and her sister is realistic about the expectation that eventually she may come to amputation of the right leg and is not sure her sister would want to continue to go on living like that.  She was agreeable to doing the above-knee amputation for pain control.  I will speak with Dr. Donnetta Hutching and he will proceed with this tomorrow morning at 730.  Ruta Hinds, MD [facility] Office: [phone]

## 2019-10-20 NOTE — Consult Note (Signed)
[place]  Reason for Consultation: ESRD and management of assoc conditions Requesting Provider: Dr. Doristine Bosworth  HPI: [name] is an 69 y.o. female with ESRD on HD MWF at [facility], DMT2, HTN, PAD, aortic stenosis, mitral reguglacoma, patient is legally blind, AOCD, SHPT. She was admitted yesterday for L BKA pain and concern for infection s/p recent revision 09/30/19.  Dr. Oneida Alar is following and is planning debridement vs further amputation tomorrow.  In the meantime she's been placed on broad spectrum antibiotics.    She had her last HD yesterday prior to admission.  This AM K 3.5, BUN 12, Ca 8.3, Hb 8.4.  She currently has no complaints, says tolerates HD ok.   PMH: Past Medical History:  Diagnosis Date  . Anemia   . Anxiety   . Arthritis    "joints" (06/15/2013)  . Asthma   . 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 Blood transfusion without reported diagnosis   . Breast cancer ([place])    left  . CHF (congestive heart failure) ([place])   . 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 Esophageal ulcer with bleeding   . ESRD (end stage renal disease) ([place])    "suppose to start dialysis today" (06/15/2013)  . Family history of adverse reaction to anesthesia    It took a while for pt sister to wake from anesthesia  . GERD (gastroesophageal reflux disease)   . Glaucoma    blind in both eyes  . Heart murmur    Mild AS, moderate MR, moderate TR 10/30/16 echo  . Hx of adenomatous colonic polyps 04/07/2018  . Hypertension   . Myalgia 12/31/2011  . Neuropathy 12/31/2011  . PAD (peripheral artery disease) (HCC)    nonviable tissue left lower extremity  . Shortness of breath    "when she doesn't go to dialysis"  . Type II diabetes mellitus (HCC)    Type II  . Walker as ambulation aid    PSH: Past Surgical History:  Procedure Laterality Date  . A/V FISTULAGRAM N/A 09/21/2018   Procedure: A/V FISTULAGRAM - Right Upper;  Surgeon:  [name], MD;  Location: [place] [facility];  Service: Cardiovascular;  Laterality: N/A;  . ABDOMINAL AORTOGRAM N/A 11/30/2018   Procedure: ABDOMINAL AORTOGRAM;  Surgeon: [name], MD;  Location: [place] [facility];  Service: Cardiovascular;  Laterality: N/A;  . ABDOMINAL AORTOGRAM W/LOWER EXTREMITY Left 07/29/2019   Procedure: ABDOMINAL AORTOGRAM W/LOWER EXTREMITY;  Surgeon: [name], MD;  Location: [place] [facility];  Service: Cardiovascular;  Laterality: Left;  . ABDOMINAL HYSTERECTOMY     partial  . AMPUTATION Left 08/30/2019   Procedure: AMPUTATION BELOW KNEE LEFT;  Surgeon: [name], MD;  [facility];  Service: Vascular;  Laterality: Left;  . AV FISTULA PLACEMENT Right 06/06/2013   Procedure: ARTERIOVENOUS (AV) FISTULA CREATION-RIGHT BRACHIAL CEPHALIC;  Surgeon: Conrad [place], MD;  Location: [place];  Service: Vascular;  Laterality: Right;  . BREAST BIOPSY Left   . BREAST LUMPECTOMY Left    "and took out some lymph nodes" (06/15/2013)  . CARDIAC CATHETERIZATION     04/02/15 [facility]): no angiographic CAD, LVEF 40% with global hypokinesis (LHC done for + stress echo, EF 45% 11/28/14)  . CARPAL TUNNEL RELEASE Left 11/26/2017   Procedure: LEFT CARPAL TUNNEL RELEASE;  Surgeon: [name], MD;  Location: [place];  Service: Orthopedics;  Laterality: Left;  .  CATARACT EXTRACTION W/ ANTERIOR VITRECTOMY Bilateral   . CESAREAN SECTION  1980  . COLONOSCOPY    . ESOPHAGOGASTRODUODENOSCOPY N/A 07/08/2017   Procedure: ESOPHAGOGASTRODUODENOSCOPY (EGD);  Surgeon: [name], MD;  Location: [facility] ENDOSCOPY;  Service: Endoscopy;  Laterality: N/A;  . ESOPHAGOGASTRODUODENOSCOPY (EGD) WITH PROPOFOL N/A 02/07/2018   Procedure: ESOPHAGOGASTRODUODENOSCOPY (EGD) WITH PROPOFOL;  Surgeon: [name], MD;  Location: [facility] ENDOSCOPY;  Service: Gastroenterology;  Laterality: N/A;  APC and clips placed  . EYE SURGERY Bilateral    laser surgery  . LOWER EXTREMITY ANGIOGRAPHY  Bilateral 11/30/2018   Procedure: LOWER EXTREMITY ANGIOGRAPHY;  Surgeon: [name], MD;  Location: [place] [facility];  Service: Cardiovascular;  Laterality: Bilateral;  . PARS PLANA VITRECTOMY Right 05/05/2017   Procedure: PARS PLANA VITRECTOMY WITH 25 GAUGE; PARTIAL REMOVAL OF OIL; INFERIOR PERIPHERAL IRIDECTOMY, REFORM ANTERIOR CHAMBER RIGHT EYE;  Surgeon: [name], MD;  Location: [place];  Service: Ophthalmology;  Laterality: Right;  . PERIPHERAL VASCULAR BALLOON ANGIOPLASTY Right 09/21/2018   Procedure: PERIPHERAL VASCULAR BALLOON ANGIOPLASTY;  Surgeon: [name], MD;  Location: [place] [facility];  Service: Cardiovascular;  Laterality: Right;  AV fistula  . PERIPHERAL VASCULAR BALLOON ANGIOPLASTY Left 11/30/2018   Procedure: PERIPHERAL VASCULAR BALLOON ANGIOPLASTY;  Surgeon: [name], MD;  Location: [place] [facility];  Service: Cardiovascular;  Laterality: Left;  Left Anterior Tibial Artery  . REFRACTIVE SURGERY Bilateral   . REMOVAL OF A DIALYSIS CATHETER Right 06/06/2013   Procedure: REMOVAL OF RIGHT MEDIPORT;  Surgeon: Conrad Grafton, MD;  Location: [place];  Service: Vascular;  Laterality: Right;  . TONSILLECTOMY    . UPPER GASTROINTESTINAL ENDOSCOPY    . WOUND DEBRIDEMENT Left 09/30/2019   Procedure: IRRIGATION AND DEBRIDEMENT WOUND OF LEFT BELOW KNEE AMPUTATION;  Surgeon: [name], MD;  Location: [facility] OR;  Service: Vascular;  Laterality: Left;    Past Medical History:  Diagnosis Date  . Anemia   . Anxiety   . Arthritis    "joints" (06/15/2013)  . Asthma   . 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 Blood transfusion without reported diagnosis   . Breast cancer ([place])    left  . CHF (congestive heart failure) ([place])   . 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 Esophageal ulcer with bleeding   . ESRD (end stage renal disease) ([place])    "suppose to start dialysis today" (06/15/2013)  . Family history of adverse reaction to  anesthesia    It took a while for pt sister to wake from anesthesia  . GERD (gastroesophageal reflux disease)   . Glaucoma    blind in both eyes  . Heart murmur    Mild AS, moderate MR, moderate TR 10/30/16 echo  . Hx of adenomatous colonic polyps 04/07/2018  . Hypertension   . Myalgia 12/31/2011  . Neuropathy 12/31/2011  . PAD (peripheral artery disease) (HCC)    nonviable tissue left lower extremity  . Shortness of breath    "when she doesn't go to dialysis"  . Type II diabetes mellitus (HCC)    Type II  . Walker as ambulation aid     Medications:  I have reviewed the patient's current medications.  Facility-Administered Medications Prior to Admission  Medication Dose Route Frequency Provider Last Rate Last Admin  . [DISCONTINUED] 0.9 %  sodium chloride infusion  500 mL Intravenous Once [name], MD      . [  DISCONTINUED] dexamethasone (DECADRON) injection 4 mg  4 mg Intramuscular Once Hilts, Michael, MD       Medications Prior to Admission  Medication Sig Dispense Refill  . acetaminophen (TYLENOL) 500 MG tablet Take 1,000 mg by mouth 4 (four) times daily.     Marland Kitchen albuterol (PROVENTIL) (2.5 MG/3ML) 0.083% nebulizer solution Take 3 mLs (2.5 mg total) by nebulization every 6 (six) hours as needed for wheezing or shortness of breath. 75 mL 12  . albuterol (VENTOLIN HFA) 108 (90 Base) MCG/ACT inhaler Inhale 2 puffs into the lungs every 6 (six) hours as needed for wheezing or shortness of breath.     Marland Kitchen aspirin EC 81 MG tablet Take 81 mg by mouth daily.    Marland Kitchen atropine 1 % ophthalmic solution Place 1 drop into the right eye 2 (two) times daily.  0  . B Complex-C-Folic Acid (RENA-VITE RX) 1 MG TABS Take 1 tablet by mouth daily with breakfast.   1  . carvedilol (COREG) 25 MG tablet Take 25 mg by mouth 2 (two) times daily.  2  . clopidogrel (PLAVIX) 75 MG tablet Take 1 tablet (75 mg total) by mouth daily. 30 tablet 11  . diclofenac sodium (VOLTAREN) 1 % GEL Apply 2 g topically See admin  instructions. Apply 2 grams to painful sites four times a day    . diphenhydrAMINE (BENADRYL) 25 MG tablet Take 25 mg by mouth at bedtime.    . docusate sodium (COLACE) 100 MG capsule Take 100 mg by mouth 2 (two) times daily with a meal.     . DULoxetine (CYMBALTA) 30 MG capsule Take 30 mg by mouth daily.    . fluticasone (FLONASE) 50 MCG/ACT nasal spray Place 2 sprays into both nostrils at bedtime.     . gabapentin (NEURONTIN) 100 MG capsule Take 100 mg by mouth 2 (two) times daily.    . insulin aspart (NOVOLOG FLEXPEN) 100 UNIT/ML FlexPen Inject 6-10 Units into the skin See admin instructions. Inject 6-10 into the skin three times a day with meals, per sliding scale    . lanthanum (FOSRENOL) 1000 MG chewable tablet Chew 1,000-4,000 mg by mouth See admin instructions. Chew 4,000 mg by mouth with each meal and 1,000 mg with each snack    . lisinopril (PRINIVIL,ZESTRIL) 40 MG tablet Take 40 mg by mouth at bedtime.     . mupirocin ointment (BACTROBAN) 2 % Apply to affected area right foot once daily (Patient taking differently: Apply 1 application topically See admin instructions. Apply to affected area right foot once daily) 30 g 1  . omeprazole (PRILOSEC) 40 MG capsule Take 1 capsule (40 mg total) by mouth daily. (Patient taking differently: Take 40 mg by mouth 2 (two) times daily. )    . prednisoLONE acetate (PRED FORTE) 1 % ophthalmic suspension Place 1 drop into the right eye 2 (two) times daily.   0  . traMADol (ULTRAM) 50 MG tablet Take 1 tablet (50 mg total) by mouth every 6 (six) hours as needed. (Patient taking differently: Take 25 mg by mouth every 3 (three) hours. ) 20 tablet 0  . Alcohol Swabs (B-D SINGLE USE SWABS REGULAR) PADS in the morning, at noon, and at bedtime.     Marland Kitchen amLODipine (NORVASC) 5 MG tablet Take 5 mg by mouth at bedtime.     . BD PEN NEEDLE NANO 2ND GEN 32G X 4 MM MISC in the morning, at noon, and at bedtime.     . darbepoetin (ARANESP)  200 MCG/0.4ML SOLN injection Inject  0.4 mLs (200 mcg total) into the vein every Wednesday with hemodialysis. 1.68 mL   . doxercalciferol (HECTOROL) 4 MCG/2ML injection Inject 0.5 mLs (1 mcg total) into the vein every Monday, Wednesday, and Friday with hemodialysis. 2 mL   . ONETOUCH VERIO test strip 1 each by Other route 3 (three) times daily.   9    ALLERGIES:   Allergies  Allergen Reactions  . Tape Itching and Rash    59M Transpore adhesive tape. Medical tape pulls off the skin!! PAPER TAPE ONLY, PLEASE  . Latex Hives  . Oxycodone Other (See Comments)    Hallucinations   . Tramadol Other (See Comments)    Hallucinations with a full tablet  . Morphine And Related Other (See Comments)    Hallucinations   . Vicodin [Hydrocodone-Acetaminophen] Other (See Comments)    Hallucinations     FAM HX: Family History  Problem Relation Age of Onset  . Diabetes Mother   . Hyperlipidemia Mother   . Hypertension Mother   . Hypertension Father   . Diabetes Sister   . Diabetes Brother   . Hypertension Brother   . Heart attack Brother   . Kidney disease Brother   . Colon cancer Neg Hx   . Colon polyps Neg Hx   . Esophageal cancer Neg Hx   . Gallbladder disease Neg Hx   . Rectal cancer Neg Hx   . Stomach cancer Neg Hx     Social History:   reports that she has never smoked. She has never used smokeless tobacco. She reports that she does not drink alcohol or use drugs.  ROS: 12 system ROS neg except per HPI  Blood pressure (!) 110/34, pulse 72, temperature 98.8 F (37.1 C), temperature source Oral, resp. rate 18, height 5\' 5"  (1.651 m), weight 80 kg, SpO2 94 %. PHYSICAL EXAM: Gen: lying in bed, comfortable  Eyes:  blink ENT: MMM Neck: supple, no JVD CV:  RRR Abd:  Soft Lungs: normal WOB on RA, clear to bases GU: no foley Extr:  No edema, L BKA stump open  Neuro: conversant and oriented   Results for orders placed or performed during the hospital encounter of 10/19/19 (from the past 48 hour(s))  Culture, blood  (Routine x 2)     Status: None (Preliminary result)   Collection Time: 10/19/19  7:45 PM   Specimen: BLOOD RIGHT ARM  Result Value Ref Range   Specimen Description BLOOD RIGHT ARM    Special Requests      BOTTLES DRAWN AEROBIC AND ANAEROBIC Blood Culture results may not be optimal due to an excessive volume of blood received in culture bottles   Culture      NO GROWTH < 12 HOURS Performed at [facility], [place] [address] [place], [place] [address]    Report Status PENDING   Comprehensive metabolic panel     Status: Abnormal   Collection Time: 10/19/19  7:57 PM  Result Value Ref Range   Sodium 138 135 - 145 mmol/L   Potassium 3.3 (L) 3.5 - 5.1 mmol/L   Chloride 93 (L) 98 - 111 mmol/L   CO2 30 22 - 32 mmol/L   Glucose, Bld 147 (H) 70 - 99 mg/dL    Comment: Glucose reference range applies only to samples taken after fasting for at least 8 hours.   BUN 12 8 - 23 mg/dL   Creatinine, Ser 4.35 (H) 0.44 - 1.00 mg/dL  Calcium 8.2 (L) 8.9 - 10.3 mg/dL   Total Protein 6.5 6.5 - 8.1 g/dL   Albumin 2.7 (L) 3.5 - 5.0 g/dL   AST 31 15 - 41 U/L   ALT 22 0 - 44 U/L   Alkaline Phosphatase 212 (H) 38 - 126 U/L   Total Bilirubin 1.0 0.3 - 1.2 mg/dL   GFR calc non Af Amer 10 (L) >60 mL/min   GFR calc Af Amer 11 (L) >60 mL/min   Anion gap 15 5 - 15    Comment: Performed at [facility] [address], [place], [place] [address]  Lactic acid, plasma     Status: Abnormal   Collection Time: 10/19/19  7:57 PM  Result Value Ref Range   Lactic Acid, Venous 2.3 (HH) 0.5 - 1.9 mmol/L    Comment: CRITICAL RESULT CALLED TO, READ BACK BY AND VERIFIED WITH: RN B SANGALANG @2057  10/19/19 BY S GEZAHEGN Performed at [facility], [place] [address], [place], [place] [id number]   CBC with Differential     Status: Abnormal   Collection Time: 10/19/19  7:57 PM  Result Value Ref Range   WBC 11.3 (H) 4.0 - 10.5 K/uL   RBC 2.80 (L) 3.87 - 5.11 MIL/uL   Hemoglobin 8.6 (L) 12.0 - 15.0 g/dL   HCT  26.8 (L) 36.0 - 46.0 %   MCV 95.7 80.0 - 100.0 fL   MCH 30.7 26.0 - 34.0 pg   MCHC 32.1 30.0 - 36.0 g/dL   RDW 20.8 (H) 11.5 - 15.5 %   Platelets 258 150 - 400 K/uL   nRBC 0.0 0.0 - 0.2 %   Neutrophils Relative % 66 %   Neutro Abs 7.5 1.7 - 7.7 K/uL   Lymphocytes Relative 24 %   Lymphs Abs 2.7 0.7 - 4.0 K/uL   Monocytes Relative 8 %   Monocytes Absolute 0.9 0.1 - 1.0 K/uL   Eosinophils Relative 1 %   Eosinophils Absolute 0.1 0.0 - 0.5 K/uL   Basophils Relative 1 %   Basophils Absolute 0.1 0.0 - 0.1 K/uL   Immature Granulocytes 0 %   Abs Immature Granulocytes 0.04 0.00 - 0.07 K/uL    Comment: Performed at [facility], [address] [place], [place] [address]  Culture, blood (Routine x 2)     Status: None (Preliminary result)   Collection Time: 10/19/19  7:59 PM   Specimen: BLOOD LEFT HAND  Result Value Ref Range   Specimen Description BLOOD LEFT HAND    Special Requests      BOTTLES DRAWN AEROBIC ONLY Blood Culture adequate volume   Culture      NO GROWTH < 12 HOURS Performed at [facility], [place] [address] [place], [place] [address]    Report Status PENDING   Lactic acid, plasma     Status: None   Collection Time: 10/19/19  9:37 PM  Result Value Ref Range   Lactic Acid, Venous 1.6 0.5 - 1.9 mmol/L    Comment: Performed at [facility], [place] [address], [place], [place] [address]  SARS CORONAVIRUS 2 (TAT 6-24 HRS) Nasopharyngeal Nasopharyngeal Swab     Status: None   Collection Time: 10/19/19 10:12 PM   Specimen: Nasopharyngeal Swab  Result Value Ref Range   SARS Coronavirus 2 NEGATIVE NEGATIVE    Comment: (NOTE) SARS-CoV-2 target nucleic acids are NOT DETECTED. The SARS-CoV-2 RNA is generally detectable in upper and lower respiratory specimens during  the acute phase of infection. Negative results do not preclude SARS-CoV-2 infection, do not rule out co-infections with other pathogens, and should not be used as the sole basis for treatment or other  patient management decisions. Negative results must be combined with clinical observations, patient history, and epidemiological information. The expected result is Negative. Fact Sheet for Patients: SugarRoll.be Fact Sheet for Healthcare Providers: https://www.woods-mathews.com/ This test is not yet approved or cleared by the [place] FDA and  has been authorized for detection and/or diagnosis of SARS-CoV-2 by FDA under an Emergency Use Authorization (EUA). This EUA will remain  in effect (meaning this test can be used) for the duration of the COVID-19 declaration under Section 56 4(b)(1) of the Act, 21 U.S.C. section 360bbb-3(b)(1), unless the authorization is terminated or revoked sooner. Performed at [facility], [place] [address], [place], [place] [address]   Hemoglobin A1c     Status: Abnormal   Collection Time: 10/19/19 10:25 PM  Result Value Ref Range   Hgb A1c MFr Bld 6.0 (H) 4.8 - 5.6 %    Comment: (NOTE) Pre diabetes:          5.7%-6.4% Diabetes:              >6.4% Glycemic control for   <7.0% adults with diabetes    Mean Plasma Glucose 125.5 mg/dL    Comment: Performed at [facility] [address] [place], [place] [address]  Glucose, capillary     Status: Abnormal   Collection Time: 10/20/19 12:25 AM  Result Value Ref Range   Glucose-Capillary 120 (H) 70 - 99 mg/dL    Comment: Glucose reference range applies only to samples taken after fasting for at least 8 hours.  CBC     Status: Abnormal   Collection Time: 10/20/19  1:23 AM  Result Value Ref Range   WBC 10.9 (H) 4.0 - 10.5 K/uL   RBC 2.80 (L) 3.87 - 5.11 MIL/uL   Hemoglobin 8.4 (L) 12.0 - 15.0 g/dL   HCT 26.8 (L) 36.0 - 46.0 %   MCV 95.7 80.0 - 100.0 fL   MCH 30.0 26.0 - 34.0 pg   MCHC 31.3 30.0 - 36.0 g/dL   RDW 20.9 (H) 11.5 - 15.5 %   Platelets 248 150 - 400 K/uL   nRBC 0.2 0.0 - 0.2 %    Comment: Performed at [facility]  [address] [place], [place] [address]  Basic metabolic panel     Status: Abnormal   Collection Time: 10/20/19  1:23 AM  Result Value Ref Range   Sodium 139 135 - 145 mmol/L   Potassium 3.5 3.5 - 5.1 mmol/L   Chloride 93 (L) 98 - 111 mmol/L   CO2 30 22 - 32 mmol/L   Glucose, Bld 137 (H) 70 - 99 mg/dL    Comment: Glucose reference range applies only to samples taken after fasting for at least 8 hours.   BUN 12 8 - 23 mg/dL   Creatinine, Ser 4.59 (H) 0.44 - 1.00 mg/dL   Calcium 8.3 (L) 8.9 - 10.3 mg/dL   GFR calc non Af Amer 9 (L) >60 mL/min   GFR calc Af Amer 11 (L) >60 mL/min   Anion gap 16 (H) 5 - 15    Comment: Performed at [facility] [address], [place], [place] [address]  C-reactive protein     Status: Abnormal   Collection Time: 10/20/19  1:23 AM  Result Value Ref Range   CRP 7.8 (H) <1.0 mg/dL    Comment: Performed at [facility] [address], [place], [place] [address]  Sedimentation rate     Status: None   Collection Time: 10/20/19  1:23 AM  Result Value Ref Range   Sed Rate 22 0 - 22 mm/hr    Comment: Performed at [facility] [address] [place], [place] [address]  Prealbumin     Status: Abnormal   Collection Time: 10/20/19  1:23 AM  Result Value Ref Range   Prealbumin 15.8 (L) 18 - 38 mg/dL    Comment: Performed at [facility] [address] [place], [place] [address]  Glucose, capillary     Status: Abnormal   Collection Time: 10/20/19  5:39 AM  Result Value Ref Range   Glucose-Capillary 121 (H) 70 - 99 mg/dL    Comment: Glucose reference range applies only to samples taken after fasting for at least 8 hours.  Glucose, capillary     Status: Abnormal   Collection Time: 10/20/19  7:58 AM  Result Value Ref Range   Glucose-Capillary 125 (H) 70 - 99 mg/dL    Comment: Glucose reference range applies only to samples taken after fasting for at least 8 hours.  Glucose, capillary     Status: Abnormal   Collection Time: 10/20/19 11:28 AM   Result Value Ref Range   Glucose-Capillary 105 (H) 70 - 99 mg/dL    Comment: Glucose reference range applies only to samples taken after fasting for at least 8 hours.    DG Knee Complete 4 Views Left  Result Date: 10/19/2019 CLINICAL DATA:  Left below-knee amputation, soft tissue infection EXAM: LEFT KNEE - COMPLETE 4+ VIEW COMPARISON:  None. FINDINGS: Frontal, bilateral oblique, lateral views of the left knee are obtained. Prior left below-knee amputation. Sharp margin of the tibial amputation site. Slight irregularity at the fibular amputation site with small ossific density displaced inferiorly, likely related to previous surgery. There is extensive soft tissue swelling of the amputation stump. Extensive vascular calcifications are seen. No joint effusion. No acute fractures. IMPRESSION: 1. Extensive soft tissue swelling at the left below-knee amputation site. 2. No acute fracture. Electronically Signed   By: Randa Ngo M.D.   On: 10/19/2019 21:42   [facility] MWF 4 hrs 180NRe 400/Autoflow 1.5 87 kg 2.0 K/ 2.25 Ca UFP 2 R AVF -No heparin -Mircera 100 mcg IV q 2 weeks (last dose 150 mcg IV 10/10/2019; Hb 8.4 3/24); venofer 100mg  weekly -Hectorol 1 mcg IV TIW (phos 5.8, ca 9.0 on 10/05/19; PTH 174 on 3/3)  Assessment/Plan 1.  Nonhealing L BKA/SP revision of L BKA: Per Dr. Oneida Alar OR tomorrow. Wound vac in place. 2.  PAD-Ulcer R heel. Per primary. Following with [facility].  3.  ESRD - MWF. HD tomorrow on schedule around OR schedule. No heparin. K+3.5. Use 4.0 K bath.  4.  Hypertension/volume  - BP well controlled. UF to EDW 87kg; will need adjustment if undergoes amputation.  5.  Anemia  -HGB 8.4, similar to recent Hb outpt. Recent ESA dose. Follow HGB. Transfuse as needed.  hold IV iron with infection. 6.  Metabolic bone disease - Continue binders, VDRA 7.  Nutrition -Renal/carb mod diet. Add nepro/renal vit. 8.  DM-per primary  Justin Mend 10/20/2019, 12:21 PM

## 2019-10-20 NOTE — Progress Notes (Signed)
Inpatient Diabetes Program Recommendations  AACE/ADA: New Consensus Statement on Inpatient Glycemic Control   Target Ranges:  Prepandial:   less than 140 mg/dL      Peak postprandial:   less than 180 mg/dL (1-2 hours)      Critically ill patients:  140 - 180 mg/dL   Results for AIYSHA, JILLSON (MRN [id number]) as of 10/20/2019 08:16  Ref. Range 10/20/2019 00:25 10/20/2019 05:39 10/20/2019 07:58  Glucose-Capillary Latest Ref Range: 70 - 99 mg/dL 120 (H) 121 (H) 125 (H)  Results for MAURIANA, DANN (MRN [id number]) as of 10/20/2019 08:16  Ref. Range 10/19/2019 19:57 10/19/2019 22:25  Glucose Latest Ref Range: 70 - 99 mg/dL 147 (H)   Hemoglobin A1C Latest Ref Range: 4.8 - 5.6 %  6.0 (H)   Review of Glycemic Control  Diabetes history: DM2 Outpatient Diabetes medications: Novolog 6-10 units TID with meals Current orders for Inpatient glycemic control: Novolog 0-6 units Q4H  Inpatient Diabetes Program Recommendations:   HgbA1C: A1C 6% on 10/19/19 indicating an average glucose of 126 mg/dl over the past2-3 months.  NOTE: Noted consult per Lower Extremity Wound order set. Chart reviewed. Patient admitted with leg infection and NPO for possible AKA surgery. CBGs trending well since admitted. A1C 6% on 10/19/19. Agree with current orders. Will sign consult off; please reconsult if needed.  Thanks, Barnie Alderman, RN, MSN, CDE Diabetes Coordinator Inpatient Diabetes Program [phone] (Team Pager from 8am to 5pm)

## 2019-10-20 NOTE — Consult Note (Signed)
[facility]  Subjective  - left leg hurts   Objective (!) 113/35 70 98.6 F (37 C) (Oral) 18 98% No intake or output data in the 24 hours ending 10/20/19 6945  Left BKA open wound 10 x 10 cm 40% granulation 60% fibrinous exudate Posterior flap viable  Right foot early dry gangrene over right medial metatarsal head and heel  Assessment/Planning: Left BKA 2/9 debridement 3/12  Some signs of healing but still significant pain Discussed with pt 50:50 chance of healing with further debridement vs left AKA Have left message with sister [name] as well Will plan for OR tomorrow for one of the above Continue local wound care NS wet to dry for now  Right dry heel gangrene and metatarsal head no options for revasc.  Would leave this alone as a biological dressing.  NO debridement as this may put [place] on a pathway to a contralateral BKA   Ruta Hinds 10/20/2019 8:32 AM --  Laboratory Lab Results: Recent Labs    10/19/19 1957 10/20/19 0123  WBC 11.3* 10.9*  HGB 8.6* 8.4*  HCT 26.8* 26.8*  PLT 258 248   BMET Recent Labs    10/19/19 1957 10/20/19 0123  NA 138 139  K 3.3* 3.5  CL 93* 93*  CO2 30 30  GLUCOSE 147* 137*  BUN 12 12  CREATININE 4.35* 4.59*  CALCIUM 8.2* 8.3*    COAG Lab Results  Component Value Date   INR 1.16 05/07/2018   INR 1.13 02/06/2018   INR 1.03 06/17/2013   No results found for: PTT

## 2019-10-21 ENCOUNTER — Encounter (HOSPITAL_COMMUNITY)
Admission: EM | Disposition: A | Discharge status: Home Health | Payer: Self-pay | Source: Home / Self Care | Attending: Family Medicine

## 2019-10-21 ENCOUNTER — Encounter (HOSPITAL_COMMUNITY): Payer: Self-pay | Admitting: Internal Medicine

## 2019-10-21 ENCOUNTER — Inpatient Hospital Stay (HOSPITAL_COMMUNITY): Payer: Medicare Other | Admitting: Anesthesiology

## 2019-10-21 ENCOUNTER — Inpatient Hospital Stay (HOSPITAL_COMMUNITY): Payer: Medicare Other

## 2019-10-21 DIAGNOSIS — T8789 Other complications of amputation stump: Secondary | ICD-10-CM

## 2019-10-21 HISTORY — PX: AMPUTATION: SHX166

## 2019-10-21 LAB — CBC
HCT: 25.6 % — ABNORMAL LOW (ref 36.0–46.0)
HCT: 26.2 % — ABNORMAL LOW (ref 36.0–46.0)
Hemoglobin: 8.3 g/dL — ABNORMAL LOW (ref 12.0–15.0)
Hemoglobin: 8.4 g/dL — ABNORMAL LOW (ref 12.0–15.0)
MCH: 30.6 pg (ref 26.0–34.0)
MCH: 30.8 pg (ref 26.0–34.0)
MCHC: 32.4 g/dL (ref 30.0–36.0)
MCHC: 32.1 g/dL (ref 30.0–36.0)
MCV: 94.5 fL (ref 80.0–100.0)
MCV: 96 fL (ref 80.0–100.0)
Platelets: 268 10*3/uL (ref 150–400)
Platelets: 330 10*3/uL (ref 150–400)
RBC: 2.71 MIL/uL — ABNORMAL LOW (ref 3.87–5.11)
RBC: 2.73 MIL/uL — ABNORMAL LOW (ref 3.87–5.11)
RDW: 21.1 % — ABNORMAL HIGH (ref 11.5–15.5)
RDW: 21.7 % — ABNORMAL HIGH (ref 11.5–15.5)
WBC: 10.3 10*3/uL (ref 4.0–10.5)
WBC: 13 10*3/uL — ABNORMAL HIGH (ref 4.0–10.5)
nRBC: 0.3 % — ABNORMAL HIGH (ref 0.0–0.2)
nRBC: 0.2 % (ref 0.0–0.2)

## 2019-10-21 LAB — BASIC METABOLIC PANEL
Anion gap: 20 — ABNORMAL HIGH (ref 5–15)
BUN: 25 mg/dL — ABNORMAL HIGH (ref 8–23)
CO2: 25 mmol/L (ref 22–32)
Calcium: 8.8 mg/dL — ABNORMAL LOW (ref 8.9–10.3)
Chloride: 93 mmol/L — ABNORMAL LOW (ref 98–111)
Creatinine, Ser: 6.64 mg/dL — ABNORMAL HIGH (ref 0.44–1.00)
GFR calc Af Amer: 7 mL/min — ABNORMAL LOW (ref >60)
GFR calc non Af Amer: 6 mL/min — ABNORMAL LOW (ref >60)
Glucose, Bld: 108 mg/dL — ABNORMAL HIGH (ref 70–99)
Potassium: 3.9 mmol/L (ref 3.5–5.1)
Sodium: 138 mmol/L (ref 135–145)

## 2019-10-21 LAB — GLUCOSE, CAPILLARY
Glucose-Capillary: 100 mg/dL — ABNORMAL HIGH (ref 70–99)
Glucose-Capillary: 102 mg/dL — ABNORMAL HIGH (ref 70–99)
Glucose-Capillary: 103 mg/dL — ABNORMAL HIGH (ref 70–99)
Glucose-Capillary: 107 mg/dL — ABNORMAL HIGH (ref 70–99)
Glucose-Capillary: 120 mg/dL — ABNORMAL HIGH (ref 70–99)
Glucose-Capillary: 135 mg/dL — ABNORMAL HIGH (ref 70–99)
Glucose-Capillary: 97 mg/dL (ref 70–99)

## 2019-10-21 LAB — CREATININE, SERUM
Creatinine, Ser: 3.18 mg/dL — ABNORMAL HIGH (ref 0.44–1.00)
GFR calc Af Amer: 17 mL/min — ABNORMAL LOW (ref >60)
GFR calc non Af Amer: 14 mL/min — ABNORMAL LOW (ref >60)

## 2019-10-21 LAB — MRSA PCR SCREENING: MRSA by PCR: NEGATIVE

## 2019-10-21 SURGERY — AMPUTATION, ABOVE KNEE
Anesthesia: General | Site: Leg Upper | Laterality: Left

## 2019-10-21 MED ORDER — ALTEPLASE 2 MG IJ SOLR: 2.0000 mg | Freq: Once | INTRAMUSCULAR | Status: DC | PRN

## 2019-10-21 MED ORDER — LABETALOL HCL 5 MG/ML IV SOLN: 10.0000 mg | INTRAVENOUS | Status: DC | PRN

## 2019-10-21 MED ORDER — LIDOCAINE HCL (CARDIAC) PF 100 MG/5ML IV SOSY
PREFILLED_SYRINGE | INTRAVENOUS | Status: DC | PRN
  Administered 2019-10-21: 40 mg via INTRAVENOUS

## 2019-10-21 MED ORDER — FENTANYL CITRATE (PF) 100 MCG/2ML IJ SOLN
INTRAMUSCULAR | Status: AC
  Administered 2019-10-21: 50 ug via INTRAVENOUS
  Filled 2019-10-21: qty 2

## 2019-10-21 MED ORDER — MORPHINE SULFATE (PF) 2 MG/ML IV SOLN: 1.0000 mg | INTRAVENOUS | Status: DC | PRN

## 2019-10-21 MED ORDER — GUAIFENESIN-DM 100-10 MG/5ML PO SYRP: 15.0000 mL | ORAL_SOLUTION | ORAL | Status: DC | PRN

## 2019-10-21 MED ORDER — ONDANSETRON HCL 4 MG/2ML IJ SOLN: 4.0000 mg | Freq: Four times a day (QID) | INTRAMUSCULAR | Status: DC | PRN

## 2019-10-21 MED ORDER — MAGNESIUM SULFATE 2 GM/50ML IV SOLN: 2.0000 g | Freq: Every day | INTRAVENOUS | Status: DC | PRN

## 2019-10-21 MED ORDER — FENTANYL CITRATE (PF) 100 MCG/2ML IJ SOLN
INTRAMUSCULAR | Status: AC
  Filled 2019-10-21: qty 2

## 2019-10-21 MED ORDER — PROPOFOL 10 MG/ML IV BOLUS
INTRAVENOUS | Status: AC
  Filled 2019-10-21: qty 20

## 2019-10-21 MED ORDER — DOCUSATE SODIUM 100 MG PO CAPS: 100.0000 mg | ORAL_CAPSULE | Freq: Every day | ORAL | Status: DC

## 2019-10-21 MED ORDER — PROPOFOL 10 MG/ML IV BOLUS
INTRAVENOUS | Status: DC | PRN
  Administered 2019-10-21: 60 mg via INTRAVENOUS

## 2019-10-21 MED ORDER — MIDAZOLAM HCL 2 MG/2ML IJ SOLN: 0.5000 mg | Freq: Once | INTRAMUSCULAR | Status: DC | PRN

## 2019-10-21 MED ORDER — CEFAZOLIN SODIUM-DEXTROSE 1-4 GM/50ML-% IV SOLN
1.0000 g | Freq: Once | INTRAVENOUS | Status: AC
  Administered 2019-10-21: 20:00:00 1 g via INTRAVENOUS
  Filled 2019-10-21: qty 50

## 2019-10-21 MED ORDER — PANTOPRAZOLE SODIUM 40 MG PO TBEC: 40.0000 mg | DELAYED_RELEASE_TABLET | Freq: Every day | ORAL | Status: DC

## 2019-10-21 MED ORDER — LIDOCAINE HCL (PF) 1 % IJ SOLN: 5.0000 mL | INTRAMUSCULAR | Status: DC | PRN

## 2019-10-21 MED ORDER — PROMETHAZINE HCL 25 MG/ML IJ SOLN: 6.2500 mg | INTRAMUSCULAR | Status: DC | PRN

## 2019-10-21 MED ORDER — PENTAFLUOROPROP-TETRAFLUOROETH EX AERO: 1.0000 "application " | INHALATION_SPRAY | CUTANEOUS | Status: DC | PRN

## 2019-10-21 MED ORDER — CEFAZOLIN SODIUM-DEXTROSE 2-4 GM/100ML-% IV SOLN: 2.0000 g | Freq: Three times a day (TID) | INTRAVENOUS | Status: DC

## 2019-10-21 MED ORDER — MORPHINE SULFATE (PF) 2 MG/ML IV SOLN: 2.0000 mg | INTRAVENOUS | Status: DC | PRN

## 2019-10-21 MED ORDER — LIDOCAINE-PRILOCAINE 2.5-2.5 % EX CREA
1.0000 "application " | TOPICAL_CREAM | CUTANEOUS | Status: DC | PRN
  Filled 2019-10-21: qty 5

## 2019-10-21 MED ORDER — GLYCOPYRROLATE PF 0.2 MG/ML IJ SOSY
PREFILLED_SYRINGE | INTRAMUSCULAR | Status: DC | PRN
  Administered 2019-10-21: .8 mg via INTRAVENOUS

## 2019-10-21 MED ORDER — SODIUM CHLORIDE 0.9 % IV SOLN: 100.0000 mL | INTRAVENOUS | Status: DC | PRN

## 2019-10-21 MED ORDER — HYDROMORPHONE HCL 1 MG/ML IJ SOLN
INTRAMUSCULAR | Status: AC
  Filled 2019-10-21: qty 1

## 2019-10-21 MED ORDER — 0.9 % SODIUM CHLORIDE (POUR BTL) OPTIME
TOPICAL | Status: DC | PRN
  Administered 2019-10-21: 1000 mL

## 2019-10-21 MED ORDER — HYDROMORPHONE HCL 1 MG/ML IJ SOLN
0.2500 mg | INTRAMUSCULAR | Status: DC | PRN
  Administered 2019-10-21 (×2): 0.25 mg via INTRAVENOUS

## 2019-10-21 MED ORDER — SENNOSIDES-DOCUSATE SODIUM 8.6-50 MG PO TABS: 1.0000 | ORAL_TABLET | Freq: Every evening | ORAL | Status: DC | PRN

## 2019-10-21 MED ORDER — HEPARIN SODIUM (PORCINE) 5000 UNIT/ML IJ SOLN
5000.0000 [IU] | Freq: Three times a day (TID) | INTRAMUSCULAR | Status: DC
  Administered 2019-10-21 – 2019-10-24 (×7): 5000 [IU] via SUBCUTANEOUS
  Filled 2019-10-21 (×8): qty 1

## 2019-10-21 MED ORDER — MIDAZOLAM HCL 2 MG/2ML IJ SOLN
INTRAMUSCULAR | Status: AC
  Filled 2019-10-21: qty 2

## 2019-10-21 MED ORDER — ACETAMINOPHEN 325 MG RE SUPP: 325.0000 mg | RECTAL | Status: DC | PRN

## 2019-10-21 MED ORDER — SODIUM CHLORIDE 0.9 % IV SOLN: INTRAVENOUS | Status: DC | PRN

## 2019-10-21 MED ORDER — ACETAMINOPHEN 325 MG PO TABS
325.0000 mg | ORAL_TABLET | ORAL | Status: DC | PRN
  Administered 2019-10-21 – 2019-10-22 (×2): 650 mg via ORAL
  Administered 2019-10-22: 325 mg via ORAL
  Filled 2019-10-21: qty 1
  Filled 2019-10-21 (×2): qty 2

## 2019-10-21 MED ORDER — METOPROLOL TARTRATE 5 MG/5ML IV SOLN: 2.0000 mg | INTRAVENOUS | Status: DC | PRN

## 2019-10-21 MED ORDER — PHENOL 1.4 % MT LIQD: 1.0000 | OROMUCOSAL | Status: DC | PRN

## 2019-10-21 MED ORDER — ALUM & MAG HYDROXIDE-SIMETH 200-200-20 MG/5ML PO SUSP: 15.0000 mL | ORAL | Status: DC | PRN

## 2019-10-21 MED ORDER — FENTANYL CITRATE (PF) 250 MCG/5ML IJ SOLN
INTRAMUSCULAR | Status: AC
  Filled 2019-10-21: qty 5

## 2019-10-21 MED ORDER — OXYCODONE HCL 5 MG PO TABS: 5.0000 mg | ORAL_TABLET | ORAL | Status: DC | PRN

## 2019-10-21 MED ORDER — PHENYLEPHRINE 40 MCG/ML (10ML) SYRINGE FOR IV PUSH (FOR BLOOD PRESSURE SUPPORT)
PREFILLED_SYRINGE | INTRAVENOUS | Status: DC | PRN
  Administered 2019-10-21: 40 ug via INTRAVENOUS
  Administered 2019-10-21: 80 ug via INTRAVENOUS

## 2019-10-21 MED ORDER — NEOSTIGMINE METHYLSULFATE 10 MG/10ML IV SOLN
INTRAVENOUS | Status: DC | PRN
  Administered 2019-10-21: 4 mg via INTRAVENOUS

## 2019-10-21 MED ORDER — HYDRALAZINE HCL 20 MG/ML IJ SOLN: 5.0000 mg | INTRAMUSCULAR | Status: DC | PRN

## 2019-10-21 MED ORDER — HEPARIN SODIUM (PORCINE) 1000 UNIT/ML DIALYSIS
1000.0000 [IU] | INTRAMUSCULAR | Status: DC | PRN
  Filled 2019-10-21: qty 1

## 2019-10-21 MED ORDER — DEXAMETHASONE SODIUM PHOSPHATE 10 MG/ML IJ SOLN
INTRAMUSCULAR | Status: DC | PRN
  Administered 2019-10-21: 4 mg via INTRAVENOUS

## 2019-10-21 MED ORDER — POTASSIUM CHLORIDE CRYS ER 20 MEQ PO TBCR: 20.0000 meq | EXTENDED_RELEASE_TABLET | Freq: Every day | ORAL | Status: DC | PRN

## 2019-10-21 MED ORDER — ROCURONIUM BROMIDE 100 MG/10ML IV SOLN
INTRAVENOUS | Status: DC | PRN
  Administered 2019-10-21: 50 mg via INTRAVENOUS

## 2019-10-21 MED ORDER — MEPERIDINE HCL 25 MG/ML IJ SOLN: 6.2500 mg | INTRAMUSCULAR | Status: DC | PRN

## 2019-10-21 SURGICAL SUPPLY — 47 items
BANDAGE ESMARK 6X9 LF (GAUZE/BANDAGES/DRESSINGS) IMPLANT
BLADE SAW GIGLI 510 (BLADE) ×3 IMPLANT
BLADE SAW GIGLI 510MM (BLADE) ×1
BNDG CMPR 9X6 STRL LF SNTH (GAUZE/BANDAGES/DRESSINGS)
BNDG ELASTIC 4X5.8 VLCR STR LF (GAUZE/BANDAGES/DRESSINGS) ×4 IMPLANT
BNDG ELASTIC 6X5.8 VLCR STR LF (GAUZE/BANDAGES/DRESSINGS) ×4 IMPLANT
BNDG ESMARK 6X9 LF (GAUZE/BANDAGES/DRESSINGS)
BNDG GAUZE ELAST 4 BULKY (GAUZE/BANDAGES/DRESSINGS) ×5 IMPLANT
CANISTER SUCT 3000ML PPV (MISCELLANEOUS) ×4 IMPLANT
CLIP LIGATING EXTRA MED SLVR (CLIP) ×4 IMPLANT
CLIP LIGATING EXTRA SM BLUE (MISCELLANEOUS) ×4 IMPLANT
COVER SURGICAL LIGHT HANDLE (MISCELLANEOUS) ×8 IMPLANT
COVER WAND RF STERILE (DRAPES) ×4 IMPLANT
CUFF TOURN SGL QUICK 34 (TOURNIQUET CUFF)
CUFF TOURN SGL QUICK 42 (TOURNIQUET CUFF) IMPLANT
CUFF TRNQT CYL 34X4.125X (TOURNIQUET CUFF) IMPLANT
DRAIN SNY 10X20 3/4 PERF (WOUND CARE) IMPLANT
DRAPE HALF SHEET 40X57 (DRAPES) ×4 IMPLANT
DRAPE ORTHO SPLIT 77X108 STRL (DRAPES) ×8
DRAPE SURG ORHT 6 SPLT 77X108 (DRAPES) ×4 IMPLANT
DRSG ADAPTIC 3X8 NADH LF (GAUZE/BANDAGES/DRESSINGS) ×3 IMPLANT
ELECT CAUTERY BLADE 6.4 (BLADE) ×4 IMPLANT
ELECT REM PT RETURN 9FT ADLT (ELECTROSURGICAL) ×4
ELECTRODE REM PT RTRN 9FT ADLT (ELECTROSURGICAL) ×2 IMPLANT
EVACUATOR SILICONE 100CC (DRAIN) IMPLANT
GAUZE SPONGE 4X4 12PLY STRL (GAUZE/BANDAGES/DRESSINGS) ×8 IMPLANT
GAUZE SPONGE 4X4 12PLY STRL LF (GAUZE/BANDAGES/DRESSINGS) ×3 IMPLANT
GAUZE XEROFORM 5X9 LF (GAUZE/BANDAGES/DRESSINGS) ×4 IMPLANT
GLOVE SS BIOGEL STRL SZ 7.5 (GLOVE) ×2 IMPLANT
GLOVE SUPERSENSE BIOGEL SZ 7.5 (GLOVE) ×2
GOWN STRL REUS W/ TWL LRG LVL3 (GOWN DISPOSABLE) ×6 IMPLANT
GOWN STRL REUS W/TWL LRG LVL3 (GOWN DISPOSABLE) ×12
KIT BASIN OR (CUSTOM PROCEDURE TRAY) ×4 IMPLANT
KIT TURNOVER KIT B (KITS) ×4 IMPLANT
NS IRRIG 1000ML POUR BTL (IV SOLUTION) ×4 IMPLANT
PACK GENERAL/GYN (CUSTOM PROCEDURE TRAY) ×4 IMPLANT
PAD ARMBOARD 7.5X6 YLW CONV (MISCELLANEOUS) ×8 IMPLANT
PADDING CAST COTTON 6X4 STRL (CAST SUPPLIES) IMPLANT
STAPLER VISISTAT 35W (STAPLE) ×4 IMPLANT
STOCKINETTE IMPERVIOUS LG (DRAPES) ×4 IMPLANT
SUT ETHILON 3 0 PS 1 (SUTURE) IMPLANT
SUT VIC AB 0 CT1 18XCR BRD 8 (SUTURE) ×4 IMPLANT
SUT VIC AB 0 CT1 8-18 (SUTURE) ×8
SUT VICRYL AB 2 0 TIES (SUTURE) ×4 IMPLANT
TOWEL GREEN STERILE (TOWEL DISPOSABLE) ×8 IMPLANT
UNDERPAD 30X30 (UNDERPADS AND DIAPERS) ×4 IMPLANT
WATER STERILE IRR 1000ML POUR (IV SOLUTION) ×4 IMPLANT

## 2019-10-21 NOTE — Anesthesia Postprocedure Evaluation (Signed)
Anesthesia Post Note  Patient: [name]  Procedure(s) Performed: Amputation Above Knee (Left Leg Upper)     Patient location during evaluation: PACU Anesthesia Type: General Level of consciousness: sedated and patient cooperative Pain management: pain level controlled (pt able to doze) Vital Signs Assessment: post-procedure vital signs reviewed and stable Respiratory status: spontaneous breathing, nonlabored ventilation, respiratory function stable and patient connected to nasal cannula oxygen Cardiovascular status: blood pressure returned to baseline and stable Postop Assessment: no apparent nausea or vomiting Anesthetic complications: no    Last Vitals:  Vitals:   10/21/19 1730 10/21/19 1800  BP: (!) 157/50 (!) 129/46  Pulse: 82 80  Resp:    Temp:    SpO2:      Last Pain:  Vitals:   10/21/19 1619  TempSrc:   PainSc: 10-Worst pain ever                 Dayane Hillenburg,E. Stanton Kissoon

## 2019-10-21 NOTE — Progress Notes (Signed)
PROGRESS NOTE    KYRIA Kane  OIN:[id number] DOB: [birth year] DOA: 10/19/2019 PCP: Prince Solian, MD   Brief Narrative:   HPI: [name] is a 69 y.o. 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  Pt presents to ED due to wound not looking good at all at dialysis today.  Wound vac without good seal, odor and purulent drainage from wound.  Patient has noted increasing pain, drainage, odor over the past several days. No fevers, nausea, vomiting, or diarrhea. Per epic, it looks like patient received dose of antibiotics at dialysis today due to the appearance of her wound and that she was supposed to follow in vascular clinic tomorrow.   ED Course: WBC 11.3k, HGB 8.6 (stable from 8.7 earlier this month).  X ray of knee: extensive soft tissue swelling at BKA site.  Started on zosyn in ED.  Vasc surg: keep NPO after MN, they will eval in AM, sounds like they are leaning towards AKA.  Assessment & Plan:   Principal Problem:   Infection of amputation stump, left lower extremity ([place]) Active Problems:   Essential hypertension   ESRD on dialysis ([place])   Blindness of both eyes   Chronic combined systolic and diastolic CHF (congestive heart failure) (H[facility])   Anemia in ESRD (end-stage renal disease) ([place])   Type 2 diabetes mellitus with diabetic polyneuropathy ([place])   1. Infection of LLE BKA stump: Vascular surgery on board.  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  Management per vascular surgery.  2. ESRD on HD MWF-per nephrology  3. Anemia in ESRD -hemoglobin stable.  Monitor daily.  4. DM2 -blood sugar stable.  Continue SSI.  Not on any medications at home.  Hemoglobin A1c 6.0 on 10/19/2019.  5. Essential hypertension: Blood pressure controlled despite of holding home medications except  amlodipine.  Continue to hold carvedilol, lisinopril,  DVT prophylaxis: Lovenox   Code Status: Full Code  Family Communication: No family member present at bedside today.  Plan of care discussed with patient in length and he verbalized understanding and agreed with it. Patient is from: Home Disposition Plan: To be determined Barriers to discharge: Surgery today.   Estimated body mass index is 29.35 kg/m as calculated from the following:   Height as of this encounter: 5\' 5"  (1.651 m).   Weight as of this encounter: 80 kg.  Pressure Injury 09/30/19 Heel Right Unstageable - Full thickness tissue loss in which the base of the injury is covered by slough (yellow, tan, gray, green or brown) and/or eschar (tan, brown or black) in the wound bed. black tender to touch small open r (Active)  09/30/19 2153  Location: Heel  Location Orientation: Right  Staging: Unstageable - Full thickness tissue loss in which the base of the injury is covered by slough (yellow, tan, gray, green or brown) and/or eschar (tan, brown or black) in the wound bed.  Wound Description (Comments): black tender to touch small open redden area  Present on Admission: Yes     Nutritional status:  Nutrition Problem: Increased nutrient needs Etiology: wound healing   Signs/Symptoms: estimated needs   Interventions: Nepro shake, MVI, Prostat    Consultants:   Vascular surgery  Procedures:   None  Antimicrobials:   Zosyn and vancomycin   Subjective: Seen and examined this morning.  No family  member present at bedside.  She complains of pain in bilateral lower extremities.  No other complaint.  Objective: Vitals:   10/21/19 1240 10/21/19 1255 10/21/19 1310 10/21/19 1323  BP: (!) 101/35 (!) 112/41 (!) 113/44 (!) 127/42  Pulse: 78 77 75 74  Resp: 14 14 14    Temp: 98.7 F (37.1 C)   (!) 96.8 F (36 C)  TempSrc:      SpO2: 98% 96% 96% 98%  Weight:      Height:        Intake/Output Summary (Last 24  hours) at 10/21/2019 1341 Last data filed at 10/21/2019 1218 Gross per 24 hour  Intake 980 ml  Output 60 ml  Net 920 ml   Filed Weights   10/19/19 1939 10/19/19 2117  Weight: 86.2 kg 80 kg    Examination:  General exam: Appears calm and comfortable, legally blind Respiratory system: Clear to auscultation. Respiratory effort normal. Cardiovascular system: S1 & S2 heard, RRR. No JVD, murmurs, rubs, gallops or clicks. No pedal edema. Gastrointestinal system: Abdomen is nondistended, soft and nontender. No organomegaly or masses felt. Normal bowel sounds heard. Central nervous system: Alert and oriented. No focal neurological deficits. Extremities: Healing wound in left stump/left BKA.  Shallow necrotic ulcer and right heel. Skin: No rashes, lesions or ulcers.  Psychiatry: Judgement and insight appear poor.  Mood & affect flat.    Data Reviewed: I have personally reviewed following labs and imaging studies  CBC: Recent Labs  Lab 10/19/19 1957 10/20/19 0123 10/21/19 0423  WBC 11.3* 10.9* 10.3  NEUTROABS 7.5  --   --   HGB 8.6* 8.4* 8.3*  HCT 26.8* 26.8* 25.6*  MCV 95.7 95.7 94.5  PLT 258 248 616   Basic Metabolic Panel: Recent Labs  Lab 10/19/19 1957 10/20/19 0123 10/21/19 0423  NA 138 139 138  K 3.3* 3.5 3.9  CL 93* 93* 93*  CO2 30 30 25   GLUCOSE 147* 137* 108*  BUN 12 12 25*  CREATININE 4.35* 4.59* 6.64*  CALCIUM 8.2* 8.3* 8.8*   GFR: Estimated Creatinine Clearance: 8.7 mL/min (A) (by C-G formula based on SCr of 6.64 mg/dL (H)). Liver Function Tests: Recent Labs  Lab 10/19/19 1957  AST 31  ALT 22  ALKPHOS 212*  BILITOT 1.0  PROT 6.5  ALBUMIN 2.7*   No results for input(s): LIPASE, AMYLASE in the last 168 hours. No results for input(s): AMMONIA in the last 168 hours. Coagulation Profile: No results for input(s): INR, PROTIME in the last 168 hours. Cardiac Enzymes: No results for input(s): CKTOTAL, CKMB, CKMBINDEX, TROPONINI in the last 168 hours. BNP  (last 3 results) No results for input(s): PROBNP in the last 8760 hours. HbA1C: Recent Labs    10/19/19 2225  HGBA1C 6.0*   CBG: Recent Labs  Lab 10/21/19 0022 10/21/19 0432 10/21/19 0809 10/21/19 0947 10/21/19 1240  GLUCAP 135* 102* 100* 103* 97   Lipid Profile: No results for input(s): CHOL, HDL, LDLCALC, TRIG, CHOLHDL, LDLDIRECT in the last 72 hours. Thyroid Function Tests: No results for input(s): TSH, T4TOTAL, FREET4, T3FREE, THYROIDAB in the last 72 hours. Anemia Panel: No results for input(s): VITAMINB12, FOLATE, FERRITIN, TIBC, IRON, RETICCTPCT in the last 72 hours. Sepsis Labs: Recent Labs  Lab 10/19/19 1957 10/19/19 2137  LATICACIDVEN 2.3* 1.6    Recent Results (from the past 240 hour(s))  Culture, blood (Routine x 2)     Status: None (Preliminary result)   Collection Time: 10/19/19  7:45 PM   Specimen:  BLOOD RIGHT ARM  Result Value Ref Range Status   Specimen Description BLOOD RIGHT ARM  Final   Special Requests   Final    BOTTLES DRAWN AEROBIC AND ANAEROBIC Blood Culture results may not be optimal due to an excessive volume of blood received in culture bottles   Culture   Final    NO GROWTH < 12 HOURS Performed at [facility] [address], [place], [place] [address]    Report Status PENDING  Incomplete  Culture, blood (Routine x 2)     Status: None (Preliminary result)   Collection Time: 10/19/19  7:59 PM   Specimen: BLOOD LEFT HAND  Result Value Ref Range Status   Specimen Description BLOOD LEFT HAND  Final   Special Requests   Final    BOTTLES DRAWN AEROBIC ONLY Blood Culture adequate volume   Culture   Final    NO GROWTH < 12 HOURS Performed at [facility], [place] [address] [place], [place] [address]    Report Status PENDING  Incomplete  SARS CORONAVIRUS 2 (TAT 6-24 HRS) Nasopharyngeal Nasopharyngeal Swab     Status: None   Collection Time: 10/19/19 10:12 PM   Specimen: Nasopharyngeal Swab  Result Value Ref Range Status    SARS Coronavirus 2 NEGATIVE NEGATIVE Final    Comment: (NOTE) SARS-CoV-2 target nucleic acids are NOT DETECTED. The SARS-CoV-2 RNA is generally detectable in upper and lower respiratory specimens during the acute phase of infection. Negative results do not preclude SARS-CoV-2 infection, do not rule out co-infections with other pathogens, and should not be used as the sole basis for treatment or other patient management decisions. Negative results must be combined with clinical observations, patient history, and epidemiological information. The expected result is Negative. Fact Sheet for Patients: SugarRoll.be Fact Sheet for Healthcare Providers: https://www.woods-mathews.com/ This test is not yet approved or cleared by the [place] FDA and  has been authorized for detection and/or diagnosis of SARS-CoV-2 by FDA under an Emergency Use Authorization (EUA). This EUA will remain  in effect (meaning this test can be used) for the duration of the COVID-19 declaration under Section 56 4(b)(1) of the Act, 21 U.S.C. section 360bbb-3(b)(1), unless the authorization is terminated or revoked sooner. Performed at [facility], [place] [address], [place], [place] [address]   MRSA PCR Screening     Status: None   Collection Time: 10/20/19 11:29 PM   Specimen: Nasopharyngeal  Result Value Ref Range Status   MRSA by PCR NEGATIVE NEGATIVE Final    Comment:        The GeneXpert MRSA Assay (FDA approved for NASAL specimens only), is one component of a comprehensive MRSA colonization surveillance program. It is not intended to diagnose MRSA infection nor to guide or monitor treatment for MRSA infections. Performed at [facility], [place] [address] [place], [place] [address]       Radiology Studies: DG Knee Complete 4 Views Left  Result Date: 10/19/2019 CLINICAL DATA:  Left below-knee amputation, soft tissue infection EXAM: LEFT KNEE -  COMPLETE 4+ VIEW COMPARISON:  None. FINDINGS: Frontal, bilateral oblique, lateral views of the left knee are obtained. Prior left below-knee amputation. Sharp margin of the tibial amputation site. Slight irregularity at the fibular amputation site with small ossific density displaced inferiorly, likely related to previous surgery. There is extensive soft tissue swelling of the amputation stump. Extensive vascular calcifications are seen. No joint effusion. No acute fractures. IMPRESSION: 1. Extensive soft  tissue swelling at the left below-knee amputation site. 2. No acute fracture. Electronically Signed   By: Randa Ngo M.D.   On: 10/19/2019 21:42    Scheduled Meds: . atropine  1 drop Right Eye BID  . Chlorhexidine Gluconate Cloth  6 each Topical Q0600  . diclofenac Sodium  2 g Topical QID  . diphenhydrAMINE  25 mg Oral QHS  . docusate sodium  100 mg Oral BID WC  . DULoxetine  30 mg Oral Daily  . feeding supplement (NEPRO CARB STEADY)  237 mL Oral BID BM  . feeding supplement (PRO-STAT SUGAR FREE 64)  30 mL Oral Daily  . gabapentin  100 mg Oral BID  . HYDROmorphone      . insulin aspart  0-6 Units Subcutaneous Q4H  . multivitamin  1 tablet Oral QHS  . pantoprazole  80 mg Oral BID  . prednisoLONE acetate  1 drop Right Eye BID  . traMADol  25 mg Oral Q3H   Continuous Infusions: . piperacillin-tazobactam (ZOSYN)  IV 2.25 g (10/21/19 0506)  . vancomycin       LOS: 2 days   Time spent: 30 minutes   Darliss Cheney, MD [facility]  10/21/2019, 1:41 PM   To contact the attending provider between 7A-7P or the covering provider during after hours 7P-7A, please log into the web site [email or web address].

## 2019-10-21 NOTE — Progress Notes (Signed)
Pt returned from short stay. A&Ox 2, self and place. Sister is here and wanting to speak with surgeon. Pt's daughter also called and wanting to speak with surgeon. Dr. Donnetta Hutching made aware, will call daughter.

## 2019-10-21 NOTE — Anesthesia Procedure Notes (Signed)
Procedure Name: Intubation Date/Time: 10/21/2019 11:49 AM Performed by: Teressa Lower., CRNA Pre-anesthesia Checklist: Patient identified, Emergency Drugs available, Suction available and Patient being monitored Patient Re-evaluated:Patient Re-evaluated prior to induction Oxygen Delivery Method: Circle system utilized Preoxygenation: Pre-oxygenation with 100% oxygen Induction Type: IV induction Ventilation: Mask ventilation without difficulty Laryngoscope Size: Mac and 3 Grade View: Grade I Tube type: Oral Tube size: 7.0 mm Number of attempts: 1 Airway Equipment and Method: Stylet and Oral airway Placement Confirmation: ETT inserted through vocal cords under direct vision,  positive ETCO2 and breath sounds checked- equal and bilateral Secured at: 22 cm Tube secured with: Tape Dental Injury: Teeth and Oropharynx as per pre-operative assessment

## 2019-10-21 NOTE — Progress Notes (Signed)
Pt arrived to rm 4E 4. Tele initiated. L AKA dressing with moderate amount of old blood from dialysis. 2 spot dry gangrenes on R heel and R great toe. VSS. Weaned pt off from 02 to RA. Pt 93% at RA.   Lavenia Atlas, RN

## 2019-10-21 NOTE — Interval H&P Note (Signed)
History and Physical Interval Note:  10/21/2019 10:54 AM  [name]  has presented today for surgery, with the diagnosis of NON HEALING WOUND LEFT LEG - NON VIABLE TISSUE.  The various methods of treatment have been discussed with the patient and family. After consideration of risks, benefits and other options for treatment, the patient has consented to  Procedure(s): DEBRIDEMENT WOUND OF LEFT LEG VERSES LEFT ABOVE KNEE AMPUTATION (Left) as a surgical intervention.  The patient's history has been reviewed, patient examined, no change in status, stable for surgery.  I have reviewed the patient's chart and labs.  Questions were answered to the patient's satisfaction.     Curt Jews

## 2019-10-21 NOTE — Op Note (Signed)
    OPERATIVE REPORT  DATE OF SURGERY: 10/21/2019  PATIENT: [name], 69 y.o. female MRN: [id number]  DOB: [birth year]  PRE-OPERATIVE DIAGNOSIS: Nonhealing left below-knee amputation  POST-OPERATIVE DIAGNOSIS:  Same  PROCEDURE: Right above-knee amputation, left leg  SURGEON:  Curt Jews, M.D.  PHYSICIAN ASSISTANT: Corie Baglia PA-C  ANESTHESIA: General  EBL: per anesthesia record  Total I/O In: 500 [I.V.:500] Out: 60 [Blood:60]  BLOOD ADMINISTERED: none  DRAINS: none  SPECIMEN: none  COUNTS CORRECT:  YES  PATIENT DISPOSITION:  PACU - hemodynamically stable  PROCEDURE DETAILS: Patient was taken to room placed to position the area of the left leg prepped draped in sterile fashion.  1/5 fishmouth incision was made to the distal thigh and carried down through the skin and fat and fascia with electrocautery.  The muscle bodies were divided in line with the skin incision.  The superficial femoral artery and supra and femoral vein were individually ligated with Vicryl ties.  The saphenous vein was ligated with Vicryl ties.  The sciatic nerve was identified and was ligated proximally and divided.  The muscle bodies.  Continued down to the level of the bone.  The bone periosteum was elevated and the bone was divided with a Gigli saw.  The edges of the femur were smoothed with a bone rasp.  The wounds irrigated with saline and hemostasis electrocautery.  Wounds were closed with 0 Vicryl figure-of-eight sutures to close the anterior fascia to the posterior fascia.  The skin was closed with skin clips.  A sterile dressing was applied and the patient was transferred to the recovery room in stable condition   Rosetta Posner, M.D., [facility] 10/21/2019 1:01 PM

## 2019-10-21 NOTE — Progress Notes (Signed)
[facility] Progress Note   Subjective: Seen on HD. Asking to get up and "walk". C/O pain in L AKA. Tolerating HD well. Minimal UF today.   Objective Vitals:   10/21/19 1255 10/21/19 1310 10/21/19 1323 10/21/19 1400  BP: (!) 112/41 (!) 113/44 (!) 127/42 (!) 146/33  Pulse: 77 75 74 72  Resp: 14 14    Temp:   (!) 96.8 F (36 C)   TempSrc:      SpO2: 96% 96% 98%   Weight:      Height:       Physical Exam General: Chronically ill appearing female in NAD Heart: H6,W7 harsh 2/6 systolic M.  Lungs: CTAB Abdomen: hypoactive BS, NT Extremities: RLE pedal edema, L AKA with ace wrap, bloody drainage from stump.  Dialysis Access: R AVF cannulated at present.    Additional Objective Labs: Basic Metabolic Panel: Recent Labs  Lab 10/19/19 1957 10/20/19 0123 10/21/19 0423  NA 138 139 138  K 3.3* 3.5 3.9  CL 93* 93* 93*  CO2 30 30 25   GLUCOSE 147* 137* 108*  BUN 12 12 25*  CREATININE 4.35* 4.59* 6.64*  CALCIUM 8.2* 8.3* 8.8*   Liver Function Tests: Recent Labs  Lab 10/19/19 1957  AST 31  ALT 22  ALKPHOS 212*  BILITOT 1.0  PROT 6.5  ALBUMIN 2.7*   No results for input(s): LIPASE, AMYLASE in the last 168 hours. CBC: Recent Labs  Lab 10/19/19 1957 10/20/19 0123 10/21/19 0423  WBC 11.3* 10.9* 10.3  NEUTROABS 7.5  --   --   HGB 8.6* 8.4* 8.3*  HCT 26.8* 26.8* 25.6*  MCV 95.7 95.7 94.5  PLT 258 248 268   Blood Culture    Component Value Date/Time   SDES BLOOD LEFT HAND 10/19/2019 1959   SPECREQUEST  10/19/2019 1959    BOTTLES DRAWN AEROBIC ONLY Blood Culture adequate volume   CULT  10/19/2019 1959    NO GROWTH < 12 HOURS Performed at [facility], [place] [address] [place], [place] [address]    REPTSTATUS PENDING 10/19/2019 1959    Cardiac Enzymes: No results for input(s): CKTOTAL, CKMB, CKMBINDEX, TROPONINI in the last 168 hours. CBG: Recent Labs  Lab 10/21/19 0022 10/21/19 0432 10/21/19 0809 10/21/19 0947 10/21/19 1240   GLUCAP 135* 102* 100* 103* 97   Iron Studies: No results for input(s): IRON, TIBC, TRANSFERRIN, FERRITIN in the last 72 hours. @lablastinr3 @ Studies/Results: DG Knee Complete 4 Views Left  Result Date: 10/19/2019 CLINICAL DATA:  Left below-knee amputation, soft tissue infection EXAM: LEFT KNEE - COMPLETE 4+ VIEW COMPARISON:  None. FINDINGS: Frontal, bilateral oblique, lateral views of the left knee are obtained. Prior left below-knee amputation. Sharp margin of the tibial amputation site. Slight irregularity at the fibular amputation site with small ossific density displaced inferiorly, likely related to previous surgery. There is extensive soft tissue swelling of the amputation stump. Extensive vascular calcifications are seen. No joint effusion. No acute fractures. IMPRESSION: 1. Extensive soft tissue swelling at the left below-knee amputation site. 2. No acute fracture. Electronically Signed   By: Randa Ngo M.D.   On: 10/19/2019 21:42   Medications: . [MAR Hold] piperacillin-tazobactam (ZOSYN)  IV 2.25 g (10/21/19 0506)  . [MAR Hold] vancomycin     . [MAR Hold] atropine  1 drop Right Eye BID  . [MAR Hold] Chlorhexidine Gluconate Cloth  6 each Topical Q0600  . [MAR Hold] diclofenac Sodium  2 g Topical QID  . [MAR Hold] diphenhydrAMINE  25 mg Oral QHS  . [MAR Hold] docusate sodium  100 mg Oral BID WC  . [MAR Hold] DULoxetine  30 mg Oral Daily  . [MAR Hold] feeding supplement (NEPRO CARB STEADY)  237 mL Oral BID BM  . [MAR Hold] feeding supplement (PRO-STAT SUGAR FREE 64)  30 mL Oral Daily  . fentaNYL      . [MAR Hold] gabapentin  100 mg Oral BID  . HYDROmorphone      . [MAR Hold] insulin aspart  0-6 Units Subcutaneous Q4H  . [MAR Hold] multivitamin  1 tablet Oral QHS  . [MAR Hold] pantoprazole  80 mg Oral BID  . [MAR Hold] prednisoLONE acetate  1 drop Right Eye BID  . [MAR Hold] traMADol  25 mg Oral Q3H     [facility] MWF 4 hrs 180NRe 400/Autoflow 1.5 87 kg 2.0 K/ 2.25 Ca UFP 2 R  AVF -No heparin -Mircera 100 mcg IV q 2 weeks (last dose 150 mcg IV 10/10/2019; Hb 8.4 3/24);  -Venofer 100 mg IV weekly -Hectorol 1 mcg IV TIW (phos 5.8, ca 9.0 on 10/05/19; PTH 174 on 3/3)  Assessment/Plan 1. Nonhealing L BKA/SP revision of L BKA: To OR today for L AKA per per Dr. Donnetta Hutching.  2. PAD-Ulcer R heel. Per primary. Following with [facility].  3. ESRD - MWF. HD today on schedule. No heparin. K+3.5. Use 4.0 K bath. 4. Hypertension/volume - BP well controlled. UF to EDW 87kg; Will probably needs to lower EDW prior to HD.  5. Anemia -HGB 8.4, similar to recent OP HGBt. Recent ESA dose. Follow HGB. Transfuse as needed. hold IV iron with infection. 6. Metabolic bone disease - Continue binders, VDRA 7. Nutrition -Renal/carb mod diet. Add nepro/renal vit. 8. DM-per primary    Jimmye Norman. Jaydy Fitzhenry NP-C 10/21/2019, 2:26 PM  [employer] [phone]

## 2019-10-21 NOTE — Progress Notes (Signed)
PHARMACY NOTE:  ANTIMICROBIAL RENAL DOSAGE ADJUSTMENT  Current antimicrobial regimen includes a mismatch between antimicrobial dosage and estimated renal function.  As per policy approved by the Pharmacy & Therapeutics and Medical Executive Committees, the antimicrobial dosage will be adjusted accordingly.  Current antimicrobial dosage: Cefazolin 2 gm IV Q 8 hrs X 2 doses  Indication: Surgical prophylaxis  Renal Function:  Estimated Creatinine Clearance: 8.8 mL/min (A) (by C-G formula based on SCr of 6.64 mg/dL (H)). [x]      On intermittent HD []      On CRRT    Antimicrobial dosage has been changed to: Cefazolin 1 gm IV X 1  Thank you for allowing pharmacy to be a part of this patient's care.  Gillermina Hu, PharmD, BCPS, [facility] Clinical Pharmacist 10/21/2019 7:10 PM

## 2019-10-21 NOTE — TOC Initial Note (Signed)
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 Patient scheduled for surgery with morning with Dr. Donnetta Hutching for nonhealing wound of left leg for non viable tissue and pain.  Patient waiting for surgery this morning.  Case management spoke with the daughter and patient at the bedside regarding discharge planning after surgery.  Patient currently has multiple family members assisting the patient with ADL's at home and transportation to HD on MWF.  Patient currently has DME that includes hospital bed, wheelchair, 3:1.  Sister states patient will need a trapeze attached prior to discharge to assist with transfer to 3:1 commode and chair.  Current home health services include [facility] - with services that include RN, OT, MSW prior to admission to the hospital this admission.  Patient may need to be evaluated for CIR/SNF - pending evaluation by PT after surgery.  Expected Discharge Plan: IP Rehab Facility Barriers to Discharge: Other (comment)(currently receiving HH with [facility] PT/RN/ MSW prior to surgery)   Patient Goals and CMS Choice Patient states their goals for this hospitalization and ongoing recovery are:: I'm nervous for surgery but I'm ready to be out of pain.      Expected Discharge Plan and Services Expected Discharge Plan: [place]   Discharge Planning Services: CM Consult Post Acute Care Choice: Resumption of Svcs/PTA Provider Living arrangements for the past 2 months: Single Family Home                  DME Arranged: (Patient will need a trapeze with swing attached to her hospital bed.) DME Agency: [facility] [place]                  Prior Living Arrangements/Services Living arrangements for the past 2 months: Single Family Home Lives with:: Adult Children, Siblings Patient language and need for interpreter reviewed:: Yes Do you feel safe going back to the place where you live?: Yes      Need for Family Participation in Patient Care: Yes (Comment) Care giver support system in place?: Yes (comment) Current home services: DME, Home PT, Home RN, Other (comment)(MSW -  [place] services through [facility]/  DME include hospital bed, WC) Criminal Activity/Legal Involvement Pertinent to Current Situation/Hospitalization: No - Comment as needed  Activities of Daily Living Home Assistive Devices/Equipment: Grab bars in shower, Shower chair without back, Environmental consultant (specify type), Wheelchair, Wound Vac ADL Screening (condition at time of admission) Patient's cognitive ability adequate to safely complete daily activities?: Yes Is the patient deaf or have difficulty hearing?: No Does the patient have difficulty seeing, even when wearing glasses/contacts?: Yes Does the patient have difficulty concentrating, remembering, or making decisions?: No Patient able to express need for assistance with ADLs?: Yes Does the patient have difficulty dressing or bathing?: Yes Independently performs ADLs?: No Communication: Independent Dressing (OT): Needs assistance Is this a change from baseline?: Pre-admission baseline Grooming: Needs assistance Is this a change from baseline?: Pre-admission baseline Feeding: Independent Bathing: Needs assistance  Is this a change from baseline?: Pre-admission baseline Toileting: Needs assistance Is this a change from baseline?: Pre-admission baseline In/Out Bed: Needs assistance Is this a change from baseline?: Pre-admission baseline Walks in Home: Needs  assistance Is this a change from baseline?: Pre-admission baseline Does the patient have difficulty walking or climbing stairs?: Yes Weakness of Legs: Both Weakness of Arms/Hands: Both  Permission Sought/Granted Permission sought to share information with : Case Manager Permission granted to share information with : Yes, Verbal Permission Granted     Permission granted to share info w AGENCY: [facility]        Emotional Assessment Appearance:: Appears older than stated age Attitude/Demeanor/Rapport: Gracious Affect (typically observed): Quiet, Anxious(Patient waiting to go to surgery this morning.) Orientation: : Oriented to Self, Oriented to Place, Oriented to  Time, Oriented to Situation Alcohol / Substance Use: Not Applicable Psych Involvement: No (comment)  Admission diagnosis:  Wound infection [T14.8XXA, L08.9] ESRD on dialysis ([place]) [N18.6, Z99.2] Hx of BKA, left ([place]) [Z89.512] Infection of amputation stump, left lower extremity ([place]) [T87.44] Patient Active Problem List   Diagnosis Date Noted  . Infection of amputation stump, left lower extremity ([place]) 10/19/2019  . PAD (peripheral artery disease) ([place]) 09/30/2019  . Gangrene of foot ([place]) 08/30/2019  . Other fatigue 02/28/2019  . Finger pain, left 02/03/2019  . Trigger ring finger of left hand 02/03/2019  . Osteomyelitis, unspecified ([place]) 11/16/2018  . Diabetic retinopathy associated with type 2 diabetes mellitus ([place]) 11/15/2018  . Rheumatoid factor positive 11/15/2018  . Anemia in ESRD (end-stage renal disease) ([place]) 05/06/2018  . Asthma 05/06/2018  . Hx of adenomatous colonic polyps 04/07/2018  . Status post carpal tunnel release 12/10/2017  . Carpal tunnel syndrome, left upper limb 11/10/2017  . Carpal tunnel syndrome, right upper limb 11/10/2017  . Bilateral hand numbness 09/28/2017  . Dependence on renal dialysis ([place]) 09/11/2017  . Abdominal discomfort, epigastric   . Hypertension 07/06/2017  . Chronic  combined systolic and diastolic CHF (congestive heart failure) ([place]) 07/06/2017  . Complicated migraine 40/98/1191  . ESRD on dialysis ([place]) 06/04/2017  . Blindness of both eyes 06/04/2017  . Glaucoma 06/04/2017  . Essential hypertension 10/27/2016  . GERD (gastroesophageal reflux disease) 10/27/2016  . Hypercalcemia 05/28/2015  . Abnormal stress test 03/15/2015  . Poor venous access 02/08/2015  . Breast cancer ([place]) 11/02/2014  . Diarrhea, unspecified 11/02/2014  . Fever, unspecified 11/02/2014  . Pain, unspecified 11/02/2014  . Pruritus, unspecified 11/02/2014  . Shortness of breath 07/28/2014  . Acquired absence of eye 03/31/2014  . Coagulation defect, unspecified ([place]) 03/22/2014  . Dysuria 02/14/2014  . Encounter for immunization 01/03/2014  . Iron deficiency anemia, unspecified 10/18/2013  . Unspecified protein-calorie malnutrition ([place]) 09/23/2013  . Complication of vascular dialysis catheter 06/21/2013  . Fibromyalgia 06/21/2013  . Personal history of breast cancer 06/21/2013  . Personal history of other diseases of the musculoskeletal system and connective tissue 06/21/2013  . Type 2 diabetes mellitus with diabetic polyneuropathy ([place]) 06/21/2013  . Secondary hyperparathyroidism of renal origin ([place]) 06/20/2013  . Unspecified complication of cardiac and vascular prosthetic device, implant and graft, subsequent encounter 06/20/2013  . Type II diabetes mellitus with renal manifestations ([place]) 06/15/2013  . Neuropathy 12/31/2011  . Breast cancer of upper-inner quadrant of left female breast ([place]) 06/27/2011   PCP:  Prince Solian, MD Pharmacy:   [facility] [place], [place] [address]  Phone: [phone] Fax: [phone]     Social Determinants of Health (SDOH) Interventions    Readmission Risk Interventions Readmission Risk Prevention Plan 10/21/2019   Transportation Screening Complete  PCP or Specialist Appt within 3-5 Days Complete  HRI or [place] Complete  Social Work Consult for [place] Planning/Counseling Complete  Palliative Care Screening Complete  Medication Review Press photographer) Complete  Some recent data might be hidden

## 2019-10-21 NOTE — Transfer of Care (Signed)
Immediate Anesthesia Transfer of Care Note  Patient: [name]  Procedure(s) Performed: Amputation Above Knee (Left Leg Upper)  Patient Location: PACU  Anesthesia Type:General  Level of Consciousness: awake, alert  and oriented  Airway & Oxygen Therapy: Patient Spontanous Breathing and Patient connected to face mask oxygen  Post-op Assessment: Report given to RN and Post -op Vital signs reviewed and stable  Post vital signs: Reviewed and stable  Last Vitals:  Vitals Value Taken Time  BP 101/35 10/21/19 1240  Temp    Pulse 80 10/21/19 1243  Resp 16 10/21/19 1243  SpO2 100 % 10/21/19 1243  Vitals shown include unvalidated device data.  Last Pain:  Vitals:   10/21/19 0803  TempSrc: Oral  PainSc:          Complications: No apparent anesthesia complications

## 2019-10-21 NOTE — Plan of Care (Signed)

## 2019-10-22 LAB — BASIC METABOLIC PANEL
Anion gap: 16 — ABNORMAL HIGH (ref 5–15)
BUN: 17 mg/dL (ref 8–23)
CO2: 27 mmol/L (ref 22–32)
Calcium: 8.4 mg/dL — ABNORMAL LOW (ref 8.9–10.3)
Chloride: 97 mmol/L — ABNORMAL LOW (ref 98–111)
Creatinine, Ser: 4.32 mg/dL — ABNORMAL HIGH (ref 0.44–1.00)
GFR calc Af Amer: 12 mL/min — ABNORMAL LOW (ref >60)
GFR calc non Af Amer: 10 mL/min — ABNORMAL LOW (ref >60)
Glucose, Bld: 118 mg/dL — ABNORMAL HIGH (ref 70–99)
Potassium: 4.4 mmol/L (ref 3.5–5.1)
Sodium: 140 mmol/L (ref 135–145)

## 2019-10-22 LAB — GLUCOSE, CAPILLARY
Glucose-Capillary: 117 mg/dL — ABNORMAL HIGH (ref 70–99)
Glucose-Capillary: 125 mg/dL — ABNORMAL HIGH (ref 70–99)
Glucose-Capillary: 151 mg/dL — ABNORMAL HIGH (ref 70–99)
Glucose-Capillary: 122 mg/dL — ABNORMAL HIGH (ref 70–99)
Glucose-Capillary: 144 mg/dL — ABNORMAL HIGH (ref 70–99)
Glucose-Capillary: 169 mg/dL — ABNORMAL HIGH (ref 70–99)

## 2019-10-22 LAB — CBC
HCT: 21.4 % — ABNORMAL LOW (ref 36.0–46.0)
Hemoglobin: 6.8 g/dL — CL (ref 12.0–15.0)
MCH: 30.2 pg (ref 26.0–34.0)
MCHC: 31.8 g/dL (ref 30.0–36.0)
MCV: 95.1 fL (ref 80.0–100.0)
Platelets: 286 10*3/uL (ref 150–400)
RBC: 2.25 MIL/uL — ABNORMAL LOW (ref 3.87–5.11)
RDW: 21.6 % — ABNORMAL HIGH (ref 11.5–15.5)
WBC: 11.7 10*3/uL — ABNORMAL HIGH (ref 4.0–10.5)
nRBC: 0 % (ref 0.0–0.2)

## 2019-10-22 LAB — PREPARE RBC (CROSSMATCH)

## 2019-10-22 LAB — HEMOGLOBIN AND HEMATOCRIT, BLOOD
HCT: 24.6 % — ABNORMAL LOW (ref 36.0–46.0)
Hemoglobin: 7.9 g/dL — ABNORMAL LOW (ref 12.0–15.0)

## 2019-10-22 MED ORDER — DARBEPOETIN ALFA 150 MCG/0.3ML IJ SOSY
150.0000 ug | PREFILLED_SYRINGE | INTRAMUSCULAR | Status: DC
  Administered 2019-10-24: 150 ug via INTRAVENOUS
  Filled 2019-10-22: qty 0.3

## 2019-10-22 MED ORDER — SODIUM CHLORIDE 0.9% IV SOLUTION: Freq: Once | INTRAVENOUS | Status: AC

## 2019-10-22 MED ORDER — SODIUM CHLORIDE 0.9% IV SOLUTION: Freq: Once | INTRAVENOUS | Status: DC

## 2019-10-22 NOTE — Progress Notes (Signed)
[facility] Progress Note   Subjective:  Completed HD yesterday with 1.5L removed. Says dialysis was "rough" yesterday. Getting transfuion in room. Pain controlled. Denies CP, SOB, N/V.   Objective Vitals:   10/22/19 0411 10/22/19 0500 10/22/19 0813 10/22/19 0912  BP: (!) 106/39  (!) 94/41 (!) 106/53  Pulse: 70  73 72  Resp: 14  16 13   Temp: 98 F (36.7 C)  98.2 F (36.8 C) 97.9 F (36.6 C)  TempSrc: Oral  Oral Oral  SpO2: 100%  98% 95%  Weight:  82.3 kg    Height:       Physical Exam General: Chronically ill appearing female in NAD Heart: RRR.  Harsh 2/6 systolic murmur.  Lungs: CTAB Abdomen: soft non-tender  Extremities: L AKA with ace wrap, dsg clean  Dialysis Access: R AVF +bruit    Additional Objective Labs: Basic Metabolic Panel: Recent Labs  Lab 10/20/19 0123 10/20/19 0123 10/21/19 0423 10/21/19 1902 10/22/19 0255  NA 139  --  138  --  140  K 3.5  --  3.9  --  4.4  CL 93*  --  93*  --  97*  CO2 30  --  25  --  27  GLUCOSE 137*  --  108*  --  118*  BUN 12  --  25*  --  17  CREATININE 4.59*   < > 6.64* 3.18* 4.32*  CALCIUM 8.3*  --  8.8*  --  8.4*   < > = values in this interval not displayed.   Liver Function Tests: Recent Labs  Lab 10/19/19 1957  AST 31  ALT 22  ALKPHOS 212*  BILITOT 1.0  PROT 6.5  ALBUMIN 2.7*   No results for input(s): LIPASE, AMYLASE in the last 168 hours. CBC: Recent Labs  Lab 10/19/19 1957 10/19/19 1957 10/20/19 0123 10/20/19 0123 10/21/19 0423 10/21/19 1902 10/22/19 0255  WBC 11.3*   < > 10.9*   < > 10.3 13.0* 11.7*  NEUTROABS 7.5  --   --   --   --   --   --   HGB 8.6*   < > 8.4*   < > 8.3* 8.4* 6.8*  HCT 26.8*   < > 26.8*   < > 25.6* 26.2* 21.4*  MCV 95.7  --  95.7  --  94.5 96.0 95.1  PLT 258   < > 248   < > 268 330 286   < > = values in this interval not displayed.   Blood Culture    Component Value Date/Time   SDES BLOOD LEFT HAND 10/19/2019 1959   SPECREQUEST  10/19/2019 1959   BOTTLES DRAWN AEROBIC ONLY Blood Culture adequate volume   CULT  10/19/2019 1959    NO GROWTH 2 DAYS Performed at [facility], [place] [address], [place], [place] [address]    REPTSTATUS PENDING 10/19/2019 1959    Cardiac Enzymes: No results for input(s): CKTOTAL, CKMB, CKMBINDEX, TROPONINI in the last 168 hours. CBG: Recent Labs  Lab 10/21/19 1240 10/21/19 1853 10/21/19 2356 10/22/19 0409 10/22/19 0809  GLUCAP 97 107* 120* 117* 125*   Iron Studies: No results for input(s): IRON, TIBC, TRANSFERRIN, FERRITIN in the last 72 hours. @lablastinr3 @ Studies/Results: No results found. Medications: . sodium chloride    . sodium chloride    . magnesium sulfate bolus IVPB    . piperacillin-tazobactam (ZOSYN)  IV 2.25 g (10/22/19 0629)  . vancomycin     . sodium chloride  Intravenous Once  . atropine  1 drop Right Eye BID  . Chlorhexidine Gluconate Cloth  6 each Topical Q0600  . diclofenac Sodium  2 g Topical QID  . diphenhydrAMINE  25 mg Oral QHS  . docusate sodium  100 mg Oral BID WC  . DULoxetine  30 mg Oral Daily  . feeding supplement (NEPRO CARB STEADY)  237 mL Oral BID BM  . feeding supplement (PRO-STAT SUGAR FREE 64)  30 mL Oral Daily  . gabapentin  100 mg Oral BID  . heparin  5,000 Units Subcutaneous Q8H  . insulin aspart  0-6 Units Subcutaneous Q4H  . multivitamin  1 tablet Oral QHS  . pantoprazole  80 mg Oral BID  . prednisoLONE acetate  1 drop Right Eye BID  . traMADol  25 mg Oral Q3H     [place] MWF4 hrs 180NRe 400/Autoflow 1.5 87 kg 2.0 K/ 2.25 Ca UFP 2 R AVF No heparin -Mircera 100 mcg IV q 2 weeks (last dose 150 mcg IV 10/10/2019; Hb 8.4 3/24);  -Venofer 100 mg IV weekly -Hectorol 1 mcg IV TIW (phos 5.8, ca 9.0 on 10/05/19; PTH 174 on 3/3)  Assessment/Plan 1. 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 Donnetta Hutching  2. PAD-Ulcer R heel. Per primary. Following with [facility].  3. ESRD - MWF. Next HD  4/5  4. Hypertension/volume - BP well  controlled. Well below EDW by weights here.  Will have lower EDW at discharge.  5. Anemia -HGB 8.4 >6.8 Transfused 1 unit prbcs 4/3. Next ESA dose due 4/5.  Hold IV iron with infection. 6. Metabolic bone disease - Continue binders, VDRA 7. Nutrition -Renal/carb mod diet. Add nepro/renal vit. 8. DM-per primary    Lynnda Child PA-C [place] Pager [phone] 10/22/2019,9:35 AM

## 2019-10-22 NOTE — Evaluation (Signed)
Physical Therapy Evaluation Patient Details Name: [name] MRN: [id number] DOB: 06/16/1951 Today's Date: 10/22/2019   History of Present Illness  Pt is a 69 yo female s/p L BKA 2/9 revision and debridement 09/30/19 d/t non healing. 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 presented w/ diagnosis of NON HEALING WOUND LEFT LEG - NON VIABLE TISSUE. now s/p L AKA 10/21/19.  Clinical Impression   Pt admitted with above diagnosis. PTA was living home with family, states she has 4 big nephews who help her with mobility, and also seems ADLs. She usually sits in w/c in bedroom when not out for appointments, once assisted by nephews, and they help get her to HD also. Pt currently with functional limitations due to the deficits listed below (see PT Problem List). This pm pt did fair with mobility needing max a x2 to complete supine<>sit, she was able to sit edge of bed with standby assist. Transfers would need max-total a x2 or lift at this time. Pt will benefit from skilled PT to increase their independence and safety with mobility to allow discharge to the venue listed below. She would greatly benefit from post acute care rehab prior to return home.      Follow Up Recommendations SNF;Supervision/Assistance - 24 hour    Equipment Recommendations  None recommended by PT    Recommendations for Other Services       Precautions / Restrictions Precautions Precautions: Fall Restrictions Weight Bearing Restrictions: Yes LLE Weight Bearing: Non weight bearing Other Position/Activity Restrictions: s/p L AKA      Mobility  Bed Mobility Overal bed mobility: Needs Assistance Bed Mobility: Supine to Sit;Sit to Supine     Supine to sit: Max assist;+2 for physical assistance Sit to supine: Max assist;+2 for physical assistance   General bed mobility comments: was able tos it edge of bed with SBA and no lob, was on 1L/min via [place] and sats in high 90s,  able to take 02 off and still remain in high 90s saturation  Transfers Overall transfer level: Needs assistance               General transfer comment: pt would need max-total a to transfer, was not able to attempt this pm  Ambulation/Gait             General Gait Details: unable at this time  Stairs            Wheelchair Mobility    Modified Rankin (Stroke Patients Only)       Balance Overall balance assessment: Needs assistance Sitting-balance support: Feet supported;Bilateral upper extremity supported Sitting balance-Leahy Scale: Fair Sitting balance - Comments: (P) pt needs increase time to complete went from max assist to supervision      Standing balance-Leahy Scale: Zero Standing balance comment: unable to stand                             Pertinent Vitals/Pain Pain Assessment: Faces Faces Pain Scale: Hurts even more Pain Location: LEL  Pain Descriptors / Indicators: Aching;Moaning;Grimacing;Guarding Pain Intervention(s): Limited activity within patient's tolerance;Monitored during session    Home Living Family/patient expects to be discharged to:: Private residence Living Arrangements: Other relatives Available Help at Discharge: Family;Available PRN/intermittently Type of Home: House Home Access: Stairs to enter Entrance Stairs-Rails: Psychiatric nurse of Steps: 5 Home Layout: One level Home Equipment: Walker - 2 wheels;Wheelchair -  manual Additional Comments: family assist to HD and to get into her w/c    Prior Function Level of Independence: Needs assistance   Gait / Transfers Assistance Needed: denies being ambulatory in some time states 4 big nephews help her  ADL's / Homemaking Assistance Needed: family assists with this also  Comments: per pt they have family assist them when they help     Hand Dominance   Dominant Hand: Right    Extremity/Trunk Assessment   Upper Extremity Assessment Upper  Extremity Assessment: Defer to OT evaluation    Lower Extremity Assessment Lower Extremity Assessment: Generalized weakness;LLE deficits/detail LLE Deficits / Details: limited ROM sec pain and perception of pain    Cervical / Trunk Assessment Cervical / Trunk Assessment: Kyphotic  Communication   Communication: No difficulties  Cognition Arousal/Alertness: Lethargic Behavior During Therapy: WFL for tasks assessed/performed Overall Cognitive Status: No family/caregiver present to determine baseline cognitive functioning                                 General Comments: unknown if this is baseline pt seems to have some moments of confusion during assessment      General Comments General comments (skin integrity, edema, etc.): on 1L/min w/ sats in high 90s, titrated to room air and sats still in high 90s    Exercises     Assessment/Plan    PT Assessment Patient needs continued PT services  PT Problem List Decreased strength;Decreased activity tolerance;Decreased balance;Decreased mobility;Decreased coordination;Decreased knowledge of use of DME;Decreased safety awareness;Pain;Obesity       PT Treatment Interventions DME instruction;Functional mobility training;Therapeutic activities;Therapeutic exercise;Balance training;Neuromuscular re-education;Patient/family education;Wheelchair mobility training    PT Goals (Current goals can be found in the Care Plan section)  Acute Rehab PT Goals Patient Stated Goal: get back to doing what she likes PT Goal Formulation: With patient Time For Goal Achievement: 11/05/19 Potential to Achieve Goals: Fair    Frequency Min 3X/week   Barriers to discharge        Co-evaluation PT/OT/SLP Co-Evaluation/Treatment: Yes Reason for Co-Treatment: Complexity of the patient's impairments (multi-system involvement);Necessary to address cognition/behavior during functional activity PT goals addressed during session: Mobility/safety  with mobility;Balance;Proper use of DME;Strengthening/ROM OT goals addressed during session: ADL's and self-care;Strengthening/ROM       AM-PAC PT "6 Clicks" Mobility  Outcome Measure Help needed turning from your back to your side while in a flat bed without using bedrails?: A Lot Help needed moving from lying on your back to sitting on the side of a flat bed without using bedrails?: A Lot Help needed moving to and from a bed to a chair (including a wheelchair)?: Total Help needed standing up from a chair using your arms (e.g., wheelchair or bedside chair)?: Total Help needed to walk in hospital room?: Total Help needed climbing 3-5 steps with a railing? : Total 6 Click Score: 8    End of Session Equipment Utilized During Treatment: Oxygen;Gait belt Activity Tolerance: Patient limited by fatigue;Patient limited by lethargy;Patient limited by pain Patient left: in bed;with call bell/phone within reach Nurse Communication: Mobility status;Other (comment)(post tx disposition) PT Visit Diagnosis: Other abnormalities of gait and mobility (R26.89);Muscle weakness (generalized) (M62.81)    Time: 0093-8182 PT Time Calculation (min) (ACUTE ONLY): 30 min   Charges:   PT Evaluation $PT Eval Moderate Complexity: [place], PT   Jadasia Haws  Yue Glasheen 10/22/2019, 3:03 PM

## 2019-10-22 NOTE — Progress Notes (Addendum)
Progress Note    10/22/2019 8:06 AM 1 Day Post-Op  Subjective: states minimal pain at this time   Vitals:   10/22/19 0000 10/22/19 0411  BP: (!) 126/37 (!) 106/39  Pulse: 78 70  Resp: 12 14  Temp: 98.1 F (36.7 C) 98 F (36.7 C)  SpO2: 98% 100%   Physical Exam: General: WDWN, not in any distress Lungs: non labored Incisions:  Left above knee amputation dressings saturated in blood. Dressings removed and large clot present in dressings, actively oozing blood from medial and lateral staple line. Pain with manipulation of amputation stump. 4 x 4 dressings, ABD, kerlix wrap and ACE bandage reapplied Extremities:  Right lower extremity well perfused, dry gangrene over right metatarsal head and right heel Abdomen: obese, soft, non tender Neurologic: Alert and oriented  CBC    Component Value Date/Time   WBC 11.7 (H) 10/22/2019 0255   RBC 2.25 (L) 10/22/2019 0255   HGB 6.8 (LL) 10/22/2019 0255   HGB 6.5 (LL) 06/13/2013 1037   HCT 21.4 (L) 10/22/2019 0255   HCT 20.6 (L) 06/13/2013 1037   PLT 286 10/22/2019 0255   PLT 367 06/13/2013 1037   MCV 95.1 10/22/2019 0255   MCV 83.4 06/13/2013 1037   MCH 30.2 10/22/2019 0255   MCHC 31.8 10/22/2019 0255   RDW 21.6 (H) 10/22/2019 0255   RDW 16.0 (H) 06/13/2013 1037   LYMPHSABS 2.7 10/19/2019 1957   LYMPHSABS 1.6 06/13/2013 1037   MONOABS 0.9 10/19/2019 1957   MONOABS 0.5 06/13/2013 1037   EOSABS 0.1 10/19/2019 1957   EOSABS 0.3 06/13/2013 1037   BASOSABS 0.1 10/19/2019 1957   BASOSABS 0.0 06/13/2013 1037    BMET    Component Value Date/Time   NA 140 10/22/2019 0255   NA 141 06/13/2013 1037   K 4.4 10/22/2019 0255   K 4.4 06/13/2013 1037   CL 97 (L) 10/22/2019 0255   CL 110 (H) 10/01/2012 1052   CO2 27 10/22/2019 0255   CO2 20 (L) 06/13/2013 1037   GLUCOSE 118 (H) 10/22/2019 0255   GLUCOSE 124 06/13/2013 1037   GLUCOSE 101 (H) 10/01/2012 1052   BUN 17 10/22/2019 0255   BUN 93.4 (H) 06/13/2013 1037   CREATININE 4.32  (H) 10/22/2019 0255   CREATININE 9.3 (HH) 06/13/2013 1037   CALCIUM 8.4 (L) 10/22/2019 0255   CALCIUM 8.8 06/13/2013 1037   GFRNONAA 10 (L) 10/22/2019 0255   GFRAA 12 (L) 10/22/2019 0255    INR    Component Value Date/Time   INR 1.16 05/07/2018 0201     Intake/Output Summary (Last 24 hours) at 10/22/2019 0806 Last data filed at 10/21/2019 2200 Gross per 24 hour  Intake 740 ml  Output 1560 ml  Net -820 ml     Assessment/Plan:  69 y.o. female is s/p left above knee amputation due to non healing left below knee amputation by Dr. Donnetta Hutching 1 Day Post-Op. Pain well controlled. Continue to float right heel off of bed. Significant bleeding noticed by nurse from left above knee amputation staple line over night and into the morning. Hgb is now 6.8 down from 8.4. She is being transfused with 1 unit PRBC. Trend CBC post transfusion. She is hemodynamically stable currently.  Dressings were changed and reinforced but will have to continue to observe for active bleeding and hemodynamic stability. May have to go back to OR if unable to stop the bleeding. I will discuss with on call vascular surgeon Dr. Donnetta Hutching who will see the  patient this morning   DVT prophylaxis:  Sq. Heparin   Karoline Caldwell, PA-C [facility] [phone] 10/22/2019 8:06 AM   I have examined the patient, reviewed and agree with above.  Comfortable currently.  No bleeding through dressing with reinforcement.  Will continue to observe and recheck wound tomorrow unless she has further bleeding.  Discussed with the patient and her sister at bedside  Curt Jews, MD 10/22/2019 8:57 AM

## 2019-10-22 NOTE — Progress Notes (Signed)
PROGRESS NOTE    [name]  FYB:[id number] DOB: September 04, 1950 DOA: 10/19/2019 PCP: Prince Solian, MD   Brief Narrative:   HPI: [name] is a 69 y.o. 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  Pt presents to ED due to wound not looking good at all at dialysis today.  Wound vac without good seal, odor and purulent drainage from wound.  Patient has noted increasing pain, drainage, odor over the past several days. No fevers, nausea, vomiting, or diarrhea. Per epic, it looks like patient received dose of antibiotics at dialysis today due to the appearance of her wound and that she was supposed to follow in vascular clinic tomorrow.   ED Course: WBC 11.3k, HGB 8.6 (stable from 8.7 earlier this month).  X ray of knee: extensive soft tissue swelling at BKA site.  Started on zosyn in ED.  Vasc surg: keep NPO after MN, they will eval in AM, sounds like they are leaning towards AKA.  Assessment & Plan:   Principal Problem:   Infection of amputation stump, left lower extremity ([place]) Active Problems:   Essential hypertension   ESRD on dialysis ([place])   Blindness of both eyes   Chronic combined systolic and diastolic CHF (congestive heart failure) (HCC)   Anemia in ESRD (end-stage renal disease) ([place])   Type 2 diabetes mellitus with diabetic polyneuropathy ([place])   1. Infection of LLE BKA stump: Vascular surgery on board.  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  Had some bleeding issues this morning which was started by pressure dressing by vascular surgery.  Management per vascular surgery.  2. ESRD on HD MWF-per nephrology  3. Anemia in ESRD/acute blood loss anemia-hemoglobin dropped to 6.8.  Transfuse 1 unit PRBC.  Recheck hemoglobin in the morning.   4. DM2 -blood sugar stable.  Continue  SSI.  Not on any medications at home.  Hemoglobin A1c 6.0 on 10/19/2019.  5. Essential hypertension: Blood pressure controlled and somewhat on the lower side despite of holding home medications except amlodipine.  Continue to hold carvedilol, lisinopril,  DVT prophylaxis: Lovenox   Code Status: Full Code  Family Communication: No family member present at bedside today.  Plan of care discussed with patient in length and he verbalized understanding and agreed with it. Patient is from: Home Disposition Plan: To be determined based on therapy assessment. Barriers to discharge: Therapy assessment and clearance from vascular surgery.   Estimated body mass index is 30.19 kg/m as calculated from the following:   Height as of this encounter: 5\' 5"  (1.651 m).   Weight as of this encounter: 82.3 kg.  Pressure Injury 09/30/19 Heel Right Unstageable - Full thickness tissue loss in which the base of the injury is covered by slough (yellow, tan, gray, green or brown) and/or eschar (tan, brown or black) in the wound bed. black tender to touch small open r (Active)  09/30/19 2153  Location: Heel  Location Orientation: Right  Staging: Unstageable - Full thickness tissue loss in which the base of the injury is covered by slough (yellow, tan, gray, green or brown) and/or eschar (tan, brown or black) in the wound bed.  Wound Description (Comments): black tender to touch small open redden area  Present on Admission: Yes     Nutritional status:  Nutrition Problem: Increased nutrient needs Etiology: wound  healing   Signs/Symptoms: estimated needs   Interventions: Nepro shake, MVI, Prostat    Consultants:   Vascular surgery  Procedures:   None  Antimicrobials:   Zosyn and vancomycin   Subjective: Seen and examined.  Patient sleepy.  No complaints.  Objective: Vitals:   10/22/19 0411 10/22/19 0500 10/22/19 0813 10/22/19 0912  BP: (!) 106/39  (!) 94/41 (!) 106/53  Pulse: 70  73 72    Resp: 14  16 13   Temp: 98 F (36.7 C)  98.2 F (36.8 C) 97.9 F (36.6 C)  TempSrc: Oral  Oral Oral  SpO2: 100%  98% 95%  Weight:  82.3 kg    Height:        Intake/Output Summary (Last 24 hours) at 10/22/2019 0943 Last data filed at 10/21/2019 2200 Gross per 24 hour  Intake 740 ml  Output 1560 ml  Net -820 ml   Filed Weights   10/21/19 1341 10/21/19 1807 10/22/19 0500  Weight: 82.8 kg 82 kg 82.3 kg    Examination:  General exam: Appears calm and comfortable, legally blind bilaterally Respiratory system: Clear to auscultation. Respiratory effort normal. Cardiovascular system: S1 & S2 heard, RRR. No JVD, murmurs, rubs, gallops or clicks. No pedal edema. Gastrointestinal system: Abdomen is nondistended, soft and nontender. No organomegaly or masses felt. Normal bowel sounds heard. Central nervous system: Alert and oriented. No focal neurological deficits. Extremities: Dressing in left lower extremity. Skin: No rashes, lesions or ulcers.  Psychiatry: Judgement and insight appear poor. Mood & affect flat.  Data Reviewed: I have personally reviewed following labs and imaging studies  CBC: Recent Labs  Lab 10/19/19 1957 10/20/19 0123 10/21/19 0423 10/21/19 1902 10/22/19 0255  WBC 11.3* 10.9* 10.3 13.0* 11.7*  NEUTROABS 7.5  --   --   --   --   HGB 8.6* 8.4* 8.3* 8.4* 6.8*  HCT 26.8* 26.8* 25.6* 26.2* 21.4*  MCV 95.7 95.7 94.5 96.0 95.1  PLT 258 248 268 330 767   Basic Metabolic Panel: Recent Labs  Lab 10/19/19 1957 10/20/19 0123 10/21/19 0423 10/21/19 1902 10/22/19 0255  NA 138 139 138  --  140  K 3.3* 3.5 3.9  --  4.4  CL 93* 93* 93*  --  97*  CO2 30 30 25   --  27  GLUCOSE 147* 137* 108*  --  118*  BUN 12 12 25*  --  17  CREATININE 4.35* 4.59* 6.64* 3.18* 4.32*  CALCIUM 8.2* 8.3* 8.8*  --  8.4*   GFR: Estimated Creatinine Clearance: 13.6 mL/min (A) (by C-G formula based on SCr of 4.32 mg/dL (H)). Liver Function Tests: Recent Labs  Lab 10/19/19 1957   AST 31  ALT 22  ALKPHOS 212*  BILITOT 1.0  PROT 6.5  ALBUMIN 2.7*   No results for input(s): LIPASE, AMYLASE in the last 168 hours. No results for input(s): AMMONIA in the last 168 hours. Coagulation Profile: No results for input(s): INR, PROTIME in the last 168 hours. Cardiac Enzymes: No results for input(s): CKTOTAL, CKMB, CKMBINDEX, TROPONINI in the last 168 hours. BNP (last 3 results) No results for input(s): PROBNP in the last 8760 hours. HbA1C: Recent Labs    10/19/19 2225  HGBA1C 6.0*   CBG: Recent Labs  Lab 10/21/19 1240 10/21/19 1853 10/21/19 2356 10/22/19 0409 10/22/19 0809  GLUCAP 97 107* 120* 117* 125*   Lipid Profile: No results for input(s): CHOL, HDL, LDLCALC, TRIG, CHOLHDL, LDLDIRECT in the last 72 hours. Thyroid Function Tests: No  results for input(s): TSH, T4TOTAL, FREET4, T3FREE, THYROIDAB in the last 72 hours. Anemia Panel: No results for input(s): VITAMINB12, FOLATE, FERRITIN, TIBC, IRON, RETICCTPCT in the last 72 hours. Sepsis Labs: Recent Labs  Lab 10/19/19 1957 10/19/19 2137  LATICACIDVEN 2.3* 1.6    Recent Results (from the past 240 hour(s))  Culture, blood (Routine x 2)     Status: None (Preliminary result)   Collection Time: 10/19/19  7:45 PM   Specimen: BLOOD RIGHT ARM  Result Value Ref Range Status   Specimen Description BLOOD RIGHT ARM  Final   Special Requests   Final    BOTTLES DRAWN AEROBIC AND ANAEROBIC Blood Culture results may not be optimal due to an excessive volume of blood received in culture bottles   Culture   Final    NO GROWTH 2 DAYS Performed at [facility], [place] [address], [place], [place] [address]    Report Status PENDING  Incomplete  Culture, blood (Routine x 2)     Status: None (Preliminary result)   Collection Time: 10/19/19  7:59 PM   Specimen: BLOOD LEFT HAND  Result Value Ref Range Status   Specimen Description BLOOD LEFT HAND  Final   Special Requests   Final    BOTTLES DRAWN AEROBIC ONLY  Blood Culture adequate volume   Culture   Final    NO GROWTH 2 DAYS Performed at [facility], [place] [address], [place], [place] [address]    Report Status PENDING  Incomplete  SARS CORONAVIRUS 2 (TAT 6-24 HRS) Nasopharyngeal Nasopharyngeal Swab     Status: None   Collection Time: 10/19/19 10:12 PM   Specimen: Nasopharyngeal Swab  Result Value Ref Range Status   SARS Coronavirus 2 NEGATIVE NEGATIVE Final    Comment: (NOTE) SARS-CoV-2 target nucleic acids are NOT DETECTED. The SARS-CoV-2 RNA is generally detectable in upper and lower respiratory specimens during the acute phase of infection. Negative results do not preclude SARS-CoV-2 infection, do not rule out co-infections with other pathogens, and should not be used as the sole basis for treatment or other patient management decisions. Negative results must be combined with clinical observations, patient history, and epidemiological information. The expected result is Negative. Fact Sheet for Patients: SugarRoll.be Fact Sheet for Healthcare Providers: https://www.woods-mathews.com/ This test is not yet approved or cleared by the [place] FDA and  has been authorized for detection and/or diagnosis of SARS-CoV-2 by FDA under an Emergency Use Authorization (EUA). This EUA will remain  in effect (meaning this test can be used) for the duration of the COVID-19 declaration under Section 56 4(b)(1) of the Act, 21 U.S.C. section 360bbb-3(b)(1), unless the authorization is terminated or revoked sooner. Performed at [facility], [place] [address], [place], [place] [address]   MRSA PCR Screening     Status: None   Collection Time: 10/20/19 11:29 PM   Specimen: Nasopharyngeal  Result Value Ref Range Status   MRSA by PCR NEGATIVE NEGATIVE Final    Comment:        The GeneXpert MRSA Assay (FDA approved for NASAL specimens only), is one component of a comprehensive MRSA  colonization surveillance program. It is not intended to diagnose MRSA infection nor to guide or monitor treatment for MRSA infections. Performed at [facility], [place] [address], [place], [place] [address]       Radiology Studies: No results found.  Scheduled Meds: . sodium chloride   Intravenous Once  . atropine  1  drop Right Eye BID  . Chlorhexidine Gluconate Cloth  6 each Topical Q0600  . diclofenac Sodium  2 g Topical QID  . diphenhydrAMINE  25 mg Oral QHS  . docusate sodium  100 mg Oral BID WC  . DULoxetine  30 mg Oral Daily  . feeding supplement (NEPRO CARB STEADY)  237 mL Oral BID BM  . feeding supplement (PRO-STAT SUGAR FREE 64)  30 mL Oral Daily  . gabapentin  100 mg Oral BID  . heparin  5,000 Units Subcutaneous Q8H  . insulin aspart  0-6 Units Subcutaneous Q4H  . multivitamin  1 tablet Oral QHS  . pantoprazole  80 mg Oral BID  . prednisoLONE acetate  1 drop Right Eye BID  . traMADol  25 mg Oral Q3H   Continuous Infusions: . sodium chloride    . sodium chloride    . magnesium sulfate bolus IVPB    . piperacillin-tazobactam (ZOSYN)  IV 2.25 g (10/22/19 0629)  . vancomycin       LOS: 3 days   Time spent: 28 minutes   Darliss Cheney, MD [facility]  10/22/2019, 9:43 AM   To contact the attending provider between 7A-7P or the covering provider during after hours 7P-7A, please log into the web site [email or web address].

## 2019-10-22 NOTE — Evaluation (Signed)
Occupational Therapy Evaluation Patient Details Name: [name] MRN: [id number] DOB: 1950-11-17 Today's Date: 10/22/2019    History of Present Illness Pt is a 69 yo female s/p L BKA 2/9 revision and debridement 09/30/19 d/t non healing. 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 presented w/ diagnosis of NON HEALING WOUND LEFT LEG - NON VIABLE TISSUE. now s/p L AKA 10/21/19.   Clinical Impression   Pt admitted with see above. Pt currently with functional limitations due to the deficits listed below (see OT Problem List). Per pt they will reside bedroom when not out for appointments an nephews will get her from place to place. Per pt she will call for one of them to transfer from bed to wheelchair. Pt required max a x2 to complete supine<>sit, she was able to sit edge of bed with standby assist while completion of hygiene of face. Transfers would need max-total a x2 or lift at this time.Pt decline to complete any standing at this time.  Pt will benefit from skilled OT to increase their safety and independence with ADL and functional mobility for ADL to facilitate discharge to venue listed below. Pt will be followed by Occupational Therapy.       Follow Up Recommendations  SNF;Supervision/Assistance - 24 hour    Equipment Recommendations       Recommendations for Other Services       Precautions / Restrictions Precautions Precautions: Fall Restrictions Weight Bearing Restrictions: Yes LLE Weight Bearing: Non weight bearing Other Position/Activity Restrictions: s/p L AKA      Mobility Bed Mobility Overal bed mobility: Needs Assistance Bed Mobility: Supine to Sit;Sit to Supine     Supine to sit: Max assist;+2 for physical assistance Sit to supine: Max assist;+2 for physical assistance   General bed mobility comments: was able tos it edge of bed with SBA and no lob, was on 1L/min via [place] and sats in high 90s, able to take 02 off and  still remain in high 90s saturation  Transfers Overall transfer level: Needs assistance               General transfer comment: pt would need max-total a to transfer, was not able to attempt this pm    Balance Overall balance assessment: Needs assistance Sitting-balance support: Feet supported;Bilateral upper extremity supported Sitting balance-Leahy Scale: Fair Sitting balance - Comments: pt needs increase time to complete went from max assist to supervision      Standing balance-Leahy Scale: Zero Standing balance comment: unable to stand                           ADL either performed or assessed with clinical judgement   ADL Overall ADL's : Needs assistance/impaired Eating/Feeding: Sitting;Set up   Grooming: Wash/dry hands;Wash/dry face;Set up;Bed level   Upper Body Bathing: Moderate assistance;Bed level;Cueing for safety;Cueing for sequencing   Lower Body Bathing: Maximal assistance;Bed level;Cueing for safety;Cueing for sequencing   Upper Body Dressing : Moderate assistance;Bed level;Cueing for safety;Cueing for sequencing   Lower Body Dressing: Maximal assistance;Cueing for safety;Cueing for sequencing;Bed level                 General ADL Comments: unable to complete transfer at this time but most likley need for lift/total assist     Vision Baseline Vision/History: Legally blind;Glaucoma       Perception Perception Perception Tested?: No   Praxis Praxis  Praxis tested?: Not tested    Pertinent Vitals/Pain Pain Assessment: Faces Faces Pain Scale: Hurts even more Pain Location: LEL  Pain Descriptors / Indicators: Aching;Moaning;Grimacing;Guarding Pain Intervention(s): Limited activity within patient's tolerance;Monitored during session     Hand Dominance Right   Extremity/Trunk Assessment Upper Extremity Assessment Upper Extremity Assessment: Defer to OT evaluation   Lower Extremity Assessment Lower Extremity Assessment:  Generalized weakness;LLE deficits/detail LLE Deficits / Details: limited ROM sec pain and perception of pain   Cervical / Trunk Assessment Cervical / Trunk Assessment: Kyphotic   Communication Communication Communication: No difficulties   Cognition Arousal/Alertness: Lethargic Behavior During Therapy: WFL for tasks assessed/performed Overall Cognitive Status: No family/caregiver present to determine baseline cognitive functioning                                 General Comments: unknown if this is baseline pt seems to have some moments of confusion during assessment   General Comments  on 1L/min w/ sats in high 90s, titrated to room air and sats still in high 90s    Exercises     Shoulder Instructions      Home Living Family/patient expects to be discharged to:: Private residence Living Arrangements: Other relatives Available Help at Discharge: Family;Available PRN/intermittently Type of Home: House Home Access: Stairs to enter [employer] of Steps: 5 Entrance Stairs-Rails: Right;Left Home Layout: One level     Bathroom Shower/Tub: Teacher, early years/pre: Standard Bathroom Accessibility: Yes How Accessible: Accessible via wheelchair;Accessible via walker Home Equipment: Victor - 2 wheels;Wheelchair - manual   Additional Comments: family assist to HD and to get into her w/c      Prior Functioning/Environment Level of Independence: Needs assistance  Gait / Transfers Assistance Needed: denies being ambulatory in some time states 4 big nephews help her ADL's / Homemaking Assistance Needed: family assists with this also   Comments: per pt they have family assist them when they help        OT Problem List: Decreased strength;Decreased range of motion;Decreased activity tolerance;Impaired balance (sitting and/or standing);Decreased safety awareness;Decreased knowledge of use of DME or AE;Pain      OT Treatment/Interventions:  Self-care/ADL training;Therapeutic exercise;DME and/or AE instruction;Therapeutic activities;Patient/family education;Balance training    OT Goals(Current goals can be found in the care plan section) Acute Rehab OT Goals Patient Stated Goal: get back to doing what she likes OT Goal Formulation: With patient Time For Goal Achievement: 11/12/19 Potential to Achieve Goals: Good ADL Goals Pt Will Perform Upper Body Dressing: with modified independence;sitting Pt Will Perform Lower Body Dressing: with mod assist;sitting/lateral leans Pt Will Transfer to Toilet: with mod assist;with +2 assist;bedside commode  OT Frequency: Min 2X/week   Barriers to D/C:            Co-evaluation PT/OT/SLP Co-Evaluation/Treatment: Yes Reason for Co-Treatment: Complexity of the patient's impairments (multi-system involvement);Necessary to address cognition/behavior during functional activity PT goals addressed during session: Mobility/safety with mobility;Balance;Proper use of DME;Strengthening/ROM OT goals addressed during session: ADL's and self-care;Strengthening/ROM      AM-PAC OT "6 Clicks" Daily Activity     Outcome Measure Help from another person eating meals?: None Help from another person taking care of personal grooming?: A Little Help from another person toileting, which includes using toliet, bedpan, or urinal?: Total Help from another person bathing (including washing, rinsing, drying)?: A Lot Help from another person to put on and taking off regular upper  body clothing?: A Little Help from another person to put on and taking off regular lower body clothing?: A Lot 6 Click Score: 15   End of Session Nurse Communication: Other (comment)(skin)  Activity Tolerance: Patient limited by fatigue Patient left: in bed;with bed alarm set;with call bell/phone within reach  OT Visit Diagnosis: Unsteadiness on feet (R26.81);Muscle weakness (generalized) (M62.81);Pain Pain - Right/Left: Left Pain -  part of body: Leg                Time: 8453-6468 OT Time Calculation (min): 29 min Charges:  OT General Charges $OT Visit: 1 Visit OT Evaluation $OT Eval Moderate Complexity: [place] OTR/L  Acute Rehab Services  [phone] office number [phone] pager number   Joeseph Amor 10/22/2019, 3:07 PM

## 2019-10-22 NOTE — Progress Notes (Signed)
CRITICAL VALUE ALERT  Critical Value:  Hgb 6.8  Date & Time Notied:  10/22/19 0455  Provider Notified: Silas [place]   Orders Received/Actions taken: Awaiting orders

## 2019-10-23 LAB — GLUCOSE, CAPILLARY
Glucose-Capillary: 116 mg/dL — ABNORMAL HIGH (ref 70–99)
Glucose-Capillary: 138 mg/dL — ABNORMAL HIGH (ref 70–99)
Glucose-Capillary: 171 mg/dL — ABNORMAL HIGH (ref 70–99)
Glucose-Capillary: 156 mg/dL — ABNORMAL HIGH (ref 70–99)
Glucose-Capillary: 159 mg/dL — ABNORMAL HIGH (ref 70–99)
Glucose-Capillary: 245 mg/dL — ABNORMAL HIGH (ref 70–99)

## 2019-10-23 LAB — TYPE AND SCREEN
ABO/RH(D): A POS
Antibody Screen: NEGATIVE
Unit division: 0

## 2019-10-23 LAB — CBC
HCT: 23.9 % — ABNORMAL LOW (ref 36.0–46.0)
Hemoglobin: 7.7 g/dL — ABNORMAL LOW (ref 12.0–15.0)
MCH: 30.4 pg (ref 26.0–34.0)
MCHC: 32.2 g/dL (ref 30.0–36.0)
MCV: 94.5 fL (ref 80.0–100.0)
Platelets: 305 10*3/uL (ref 150–400)
RBC: 2.53 MIL/uL — ABNORMAL LOW (ref 3.87–5.11)
RDW: 21.5 % — ABNORMAL HIGH (ref 11.5–15.5)
WBC: 11.1 10*3/uL — ABNORMAL HIGH (ref 4.0–10.5)
nRBC: 0.3 % — ABNORMAL HIGH (ref 0.0–0.2)

## 2019-10-23 LAB — BASIC METABOLIC PANEL
Anion gap: 14 (ref 5–15)
BUN: 32 mg/dL — ABNORMAL HIGH (ref 8–23)
CO2: 27 mmol/L (ref 22–32)
Calcium: 8.4 mg/dL — ABNORMAL LOW (ref 8.9–10.3)
Chloride: 96 mmol/L — ABNORMAL LOW (ref 98–111)
Creatinine, Ser: 6.13 mg/dL — ABNORMAL HIGH (ref 0.44–1.00)
GFR calc Af Amer: 8 mL/min — ABNORMAL LOW (ref >60)
GFR calc non Af Amer: 7 mL/min — ABNORMAL LOW (ref >60)
Glucose, Bld: 125 mg/dL — ABNORMAL HIGH (ref 70–99)
Potassium: 4.6 mmol/L (ref 3.5–5.1)
Sodium: 137 mmol/L (ref 135–145)

## 2019-10-23 LAB — BPAM RBC
Blood Product Expiration Date: 202104112359
ISSUE DATE / TIME: 202104030924
Unit Type and Rh: 6200

## 2019-10-23 MED ORDER — LANTHANUM CARBONATE 500 MG PO CHEW
1000.0000 mg | CHEWABLE_TABLET | Freq: Once | ORAL | Status: AC
  Administered 2019-10-23: 1000 mg via ORAL
  Filled 2019-10-23: qty 2

## 2019-10-23 NOTE — Progress Notes (Signed)
Patient ID: [name], female   DOB: 04-02-1951, 69 y.o.   MRN: [id number]  Progress Note    10/23/2019 8:07 AM 2 Days Post-Op  Subjective: Comfortable this morning.  Reports less incisional soreness   Vitals:   10/23/19 0333 10/23/19 0804  BP: (!) 105/35 (!) 106/24  Pulse: 69 68  Resp: 14 16  Temp: 97.9 F (36.6 C) 98.7 F (37.1 C)  SpO2: 95% 100%   Physical Exam: Left above-knee amputation with no further bleeding.  No evidence of hematoma  CBC    Component Value Date/Time   WBC 11.1 (H) 10/23/2019 0346   RBC 2.53 (L) 10/23/2019 0346   HGB 7.7 (L) 10/23/2019 0346   HGB 6.5 (LL) 06/13/2013 1037   HCT 23.9 (L) 10/23/2019 0346   HCT 20.6 (L) 06/13/2013 1037   PLT 305 10/23/2019 0346   PLT 367 06/13/2013 1037   MCV 94.5 10/23/2019 0346   MCV 83.4 06/13/2013 1037   MCH 30.4 10/23/2019 0346   MCHC 32.2 10/23/2019 0346   RDW 21.5 (H) 10/23/2019 0346   RDW 16.0 (H) 06/13/2013 1037   LYMPHSABS 2.7 10/19/2019 1957   LYMPHSABS 1.6 06/13/2013 1037   MONOABS 0.9 10/19/2019 1957   MONOABS 0.5 06/13/2013 1037   EOSABS 0.1 10/19/2019 1957   EOSABS 0.3 06/13/2013 1037   BASOSABS 0.1 10/19/2019 1957   BASOSABS 0.0 06/13/2013 1037    BMET    Component Value Date/Time   NA 137 10/23/2019 0346   NA 141 06/13/2013 1037   K 4.6 10/23/2019 0346   K 4.4 06/13/2013 1037   CL 96 (L) 10/23/2019 0346   CL 110 (H) 10/01/2012 1052   CO2 27 10/23/2019 0346   CO2 20 (L) 06/13/2013 1037   GLUCOSE 125 (H) 10/23/2019 0346   GLUCOSE 124 06/13/2013 1037   GLUCOSE 101 (H) 10/01/2012 1052   BUN 32 (H) 10/23/2019 0346   BUN 93.4 (H) 06/13/2013 1037   CREATININE 6.13 (H) 10/23/2019 0346   CREATININE 9.3 (HH) 06/13/2013 1037   CALCIUM 8.4 (L) 10/23/2019 0346   CALCIUM 8.8 06/13/2013 1037   GFRNONAA 7 (L) 10/23/2019 0346   GFRAA 8 (L) 10/23/2019 0346    INR    Component Value Date/Time   INR 1.16 05/07/2018 0201     Intake/Output Summary (Last 24 hours) at 10/23/2019  0807 Last data filed at 10/23/2019 0400 Gross per 24 hour  Intake 1005 ml  Output --  Net 1005 ml     Assessment/Plan:  69 y.o. female stable postop day 2 from left above-knee amputation     Rosetta Posner, MD FACS [facility] [phone] 10/23/2019 8:07 AM

## 2019-10-23 NOTE — Progress Notes (Addendum)
[facility] Progress Note   Subjective:  Seen in room. Some stump pain this am.  Denies CP, SOB, N/V   Objective Vitals:   10/22/19 2306 10/23/19 0333 10/23/19 0450 10/23/19 0804  BP: (!) 106/32 (!) 105/35  (!) 106/24  Pulse: 69 69  68  Resp: 18 14  16   Temp: 98.6 F (37 C) 97.9 F (36.6 C)  98.7 F (37.1 C)  TempSrc: Oral Oral  Oral  SpO2: 100% 95%  100%  Weight:   80.4 kg   Height:       Physical Exam General: Chronically ill appearing female, nad, blind  Heart: RRR.  Harsh 2/6 systolic murmur.  Lungs: CTAB Abdomen: soft, non-tender  Extremities: L AKA with ace wrap, dsg clean  Dialysis Access: R AVF +bruit    Additional Objective Labs: Basic Metabolic Panel: Recent Labs  Lab 10/21/19 0423 10/21/19 0423 10/21/19 1902 10/22/19 0255 10/23/19 0346  NA 138  --   --  140 137  K 3.9  --   --  4.4 4.6  CL 93*  --   --  97* 96*  CO2 25  --   --  27 27  GLUCOSE 108*  --   --  118* 125*  BUN 25*  --   --  17 32*  CREATININE 6.64*   < > 3.18* 4.32* 6.13*  CALCIUM 8.8*  --   --  8.4* 8.4*   < > = values in this interval not displayed.   Liver Function Tests: Recent Labs  Lab 10/19/19 1957  AST 31  ALT 22  ALKPHOS 212*  BILITOT 1.0  PROT 6.5  ALBUMIN 2.7*   No results for input(s): LIPASE, AMYLASE in the last 168 hours. CBC: Recent Labs  Lab 10/19/19 1957 10/19/19 1957 10/20/19 0123 10/20/19 0123 10/21/19 0423 10/21/19 0423 10/21/19 1902 10/21/19 1902 10/22/19 0255 10/22/19 1516 10/23/19 0346  WBC 11.3*   < > 10.9*   < > 10.3   < > 13.0*  --  11.7*  --  11.1*  NEUTROABS 7.5  --   --   --   --   --   --   --   --   --   --   HGB 8.6*   < > 8.4*   < > 8.3*   < > 8.4*   < > 6.8* 7.9* 7.7*  HCT 26.8*   < > 26.8*   < > 25.6*   < > 26.2*   < > 21.4* 24.6* 23.9*  MCV 95.7   < > 95.7  --  94.5  --  96.0  --  95.1  --  94.5  PLT 258   < > 248   < > 268   < > 330  --  286  --  305   < > = values in this interval not displayed.   Blood  Culture    Component Value Date/Time   SDES BLOOD LEFT HAND 10/19/2019 1959   SPECREQUEST  10/19/2019 1959    BOTTLES DRAWN AEROBIC ONLY Blood Culture adequate volume   CULT  10/19/2019 1959    NO GROWTH 4 DAYS Performed at [facility], [place] [address], [place], [place] [address]    REPTSTATUS PENDING 10/19/2019 1959    Cardiac Enzymes: No results for input(s): CKTOTAL, CKMB, CKMBINDEX, TROPONINI in the last 168 hours. CBG: Recent Labs  Lab 10/22/19 1616 10/22/19 2014 10/22/19 2305 10/23/19 0346 10/23/19 0800  GLUCAP 122* 144* 169* 116* 138*   Iron Studies: No results for input(s): IRON, TIBC, TRANSFERRIN, FERRITIN in the last 72 hours. @lablastinr3 @ Studies/Results: No results found. Medications: . sodium chloride    . sodium chloride    . magnesium sulfate bolus IVPB     . sodium chloride   Intravenous Once  . atropine  1 drop Right Eye BID  . Chlorhexidine Gluconate Cloth  6 each Topical Q0600  . [START ON 10/24/2019] darbepoetin (ARANESP) injection - DIALYSIS  150 mcg Intravenous Q Mon-HD  . diclofenac Sodium  2 g Topical QID  . diphenhydrAMINE  25 mg Oral QHS  . docusate sodium  100 mg Oral BID WC  . DULoxetine  30 mg Oral Daily  . feeding supplement (NEPRO CARB STEADY)  237 mL Oral BID BM  . feeding supplement (PRO-STAT SUGAR FREE 64)  30 mL Oral Daily  . gabapentin  100 mg Oral BID  . heparin  5,000 Units Subcutaneous Q8H  . insulin aspart  0-6 Units Subcutaneous Q4H  . multivitamin  1 tablet Oral QHS  . pantoprazole  80 mg Oral BID  . prednisoLONE acetate  1 drop Right Eye BID  . traMADol  25 mg Oral Q3H     [place] MWF4 hrs 180NRe 400/[place]oflow 1.5 87 kg 2.0 K/ 2.25 Ca UFP 2 R AVF No heparin -Mircera 100 mcg IV q 2 weeks (last dose 150 mcg IV 10/10/2019; Hb 8.4 3/24);  -Venofer 100 mg IV weekly -Hectorol 1 mcg IV TIW (phos 5.8, ca 9.0 on 10/05/19; PTH 174 on 3/3)  Assessment/Plan 1. 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 Donnetta Hutching   2. PAD-Ulcer R heel. Per primary. Following with [facility].  3. ESRD - MWF. Next HD  4/5. No heparin  4. Hypertension/volume - BP controlled/low. Holding home meds d/t low BP. Well below EDW by weights here.  UF as tolerated. Will have lower EDW at discharge.  5. Anemia -Hgb 8.4 >6.8>7.7. Transfused 1 unit prbcs 4/3. Next ESA dose due 4/5, increased to 150 mcg.  Hold IV iron with infection. 6. Metabolic bone disease - Continue binders, VDRA 7. Nutrition -Renal/carb mod diet. Add nepro/renal vit. 8. DM-per primary    Lynnda Child PA-C [facility] Pager [phone] 10/23/2019,10:07 AM

## 2019-10-23 NOTE — TOC Initial Note (Signed)
Transition of Care [facility]) - Initial/Assessment Note    Patient Details  Name: [name] MRN: [id number] Date of Birth: 17-May-1951  [facility] CM/SW Contact:    Vinie Sill, [place] Phone Number: 10/23/2019, 11:19 AM  Clinical Narrative:                  CSW visit with the patient at bedside along with her sister,Reginia. CSW introduced self and explained role. CSW discuss with patient and family PT recommendation of ST rehab before discharge home. Patient and family  acknowledges the need for therapies but declined SNF placement. Patient states she receives [facility] services with [facility] (PT/RN, for wound care,SW) and wants to continue to receives [facility] services at home. Patient's sister, [name] states she and their brother helps care for the patient. Patient's son, Erlene Quan transports her to [employer], MWF. Patient and family states no questions or concerns at this time.  Thurmond Butts, MSW, LCSWA Clinical Social Worker   Expected Discharge Plan: Home w Home Health Services Barriers to Discharge: Other (comment)(currently receiving HH with [facility] PT/RN/ MSW prior to surgery)   Patient Goals and CMS Choice Patient states their goals for this hospitalization and ongoing recovery are:: I'm nervous for surgery but I'm ready to be out of pain.   Choice offered to / list presented to : Patient, Sibling  Expected Discharge Plan and Services Expected Discharge Plan: [place]   Discharge Planning Services: CM Consult Post Acute Care Choice: Resumption of Svcs/PTA Provider Living arrangements for the past 2 months: Single Family Home                 DME Arranged: (Patient will need a trapeze with swing attached to her hospital bed.) DME Agency: [facility]                  Prior Living Arrangements/Services Living arrangements for the past 2 months: Single Family Home Lives with:: Adult Children,  Siblings Patient language and need for interpreter reviewed:: No Do you feel safe going back to the place where you live?: Yes      Need for Family Participation in Patient Care: Yes (Comment) Care giver support system in place?: Yes (comment) Current home services: DME, Home PT, Home RN, Other (comment)(MSW -  [place] services through [facility]/  DME include hospital bed, WC) Criminal Activity/Legal Involvement Pertinent to Current Situation/Hospitalization: No - Comment as needed  Activities of Daily Living Home Assistive Devices/Equipment: Grab bars in shower, Shower chair without back, Environmental consultant (specify type), Wheelchair, Wound Vac ADL Screening (condition at time of admission) Patient's cognitive ability adequate to safely complete daily activities?: Yes Is the patient deaf or have difficulty hearing?: No Does the patient have difficulty seeing, even when wearing glasses/contacts?: Yes Does the patient have difficulty concentrating, remembering, or making decisions?: No Patient able to express need for assistance with ADLs?: Yes Does the patient have difficulty dressing or bathing?: Yes Independently performs ADLs?: No Communication: Independent Dressing (OT): Needs assistance Is this a change from baseline?: Pre-admission baseline Grooming: Needs assistance Is this a change from baseline?: Pre-admission baseline Feeding: Independent Bathing: Needs assistance Is this a change from baseline?: Pre-admission baseline Toileting: Needs assistance Is this a change from baseline?: Pre-admission baseline In/Out Bed: Needs assistance Is this a change from baseline?: Pre-admission baseline Walks in Home: Needs assistance Is this a change from baseline?: Pre-admission baseline Does the patient have difficulty walking or climbing  stairs?: Yes Weakness of Legs: Both Weakness of Arms/Hands: Both  Permission Sought/Granted Permission sought to share information with : Family Supports, Case  Optician, dispensing granted to share information with : Yes, Verbal Permission Granted  Share Information with NAME: Annalee Meyerhoff  Permission granted to share info w AGENCY: [facility] [place] granted to share info w Relationship: sister  Permission granted to share info w Contact Information: [phone]  Emotional Assessment Appearance:: Appears stated age Attitude/Demeanor/Rapport: Engaged Affect (typically observed): Appropriate, Calm Orientation: : Oriented to Self, Oriented to Place, Oriented to  Time, Oriented to Situation Alcohol / Substance Use: Not Applicable Psych Involvement: No (comment)  Admission diagnosis:  Wound infection [T14.8XXA, L08.9] ESRD on dialysis ([place]) [N18.6, Z99.2] Hx of BKA, left ([place]) [Z89.512] Infection of amputation stump, left lower extremity ([place]) [T87.44] Patient Active Problem List   Diagnosis Date Noted  . Infection of amputation stump, left lower extremity ([place]) 10/19/2019  . PAD (peripheral artery disease) ([place]) 09/30/2019  . Gangrene of foot ([place]) 08/30/2019  . Other fatigue 02/28/2019  . Finger pain, left 02/03/2019  . Trigger ring finger of left hand 02/03/2019  . Osteomyelitis, unspecified ([place]) 11/16/2018  . Diabetic retinopathy associated with type 2 diabetes mellitus ([place]) 11/15/2018  . Rheumatoid factor positive 11/15/2018  . Anemia in ESRD (end-stage renal disease) ([place]) 05/06/2018  . Asthma 05/06/2018  . Hx of adenomatous colonic polyps 04/07/2018  . Status post carpal tunnel release 12/10/2017  . Carpal tunnel syndrome, left upper limb 11/10/2017  . Carpal tunnel syndrome, right upper limb 11/10/2017  . Bilateral hand numbness 09/28/2017  . Dependence on renal dialysis ([place]) 09/11/2017  . Abdominal discomfort, epigastric   . Hypertension 07/06/2017  . Chronic combined systolic and diastolic CHF (congestive heart failure) ([place]) 07/06/2017  . Complicated migraine 02/63/7858  . ESRD on dialysis ([place]) 06/04/2017  .  Blindness of both eyes 06/04/2017  . Glaucoma 06/04/2017  . Essential hypertension 10/27/2016  . GERD (gastroesophageal reflux disease) 10/27/2016  . Hypercalcemia 05/28/2015  . Abnormal stress test 03/15/2015  . Poor venous access 02/08/2015  . Breast cancer ([place]) 11/02/2014  . Diarrhea, unspecified 11/02/2014  . Fever, unspecified 11/02/2014  . Pain, unspecified 11/02/2014  . Pruritus, unspecified 11/02/2014  . Shortness of breath 07/28/2014  . Acquired absence of eye 03/31/2014  . Coagulation defect, unspecified ([place]) 03/22/2014  . Dysuria 02/14/2014  . Encounter for immunization 01/03/2014  . Iron deficiency anemia, unspecified 10/18/2013  . Unspecified protein-calorie malnutrition ([place]) 09/23/2013  . Complication of vascular dialysis catheter 06/21/2013  . Fibromyalgia 06/21/2013  . Personal history of breast cancer 06/21/2013  . Personal history of other diseases of the musculoskeletal system and connective tissue 06/21/2013  . Type 2 diabetes mellitus with diabetic polyneuropathy ([place]) 06/21/2013  . Secondary hyperparathyroidism of renal origin ([place]) 06/20/2013  . Unspecified complication of cardiac and vascular prosthetic device, implant and graft, subsequent encounter 06/20/2013  . Type II diabetes mellitus with renal manifestations ([place]) 06/15/2013  . Neuropathy 12/31/2011  . Breast cancer of upper-inner quadrant of left female breast ([place]) 06/27/2011   PCP:  Prince Solian, MD Pharmacy:   [facility] [place], [place] [address] [place] [address] Phone: [phone] Fax: [phone]     Social Determinants of Health (SDOH) Interventions    Readmission Risk Interventions Readmission Risk Prevention Plan 10/21/2019  Transportation Screening Complete  PCP or Specialist Appt within 3-5 Days Complete  HRI or Home  Care Consult Complete  Social Work Consult for [place]  Planning/Counseling Complete  Palliative Care Screening Complete  Medication Review Press photographer) Complete  Some recent data might be hidden

## 2019-10-23 NOTE — Progress Notes (Signed)
PROGRESS NOTE    [name]  SHF:[id number] DOB: 10/20/1950 DOA: 10/19/2019 PCP: Prince Solian, MD   Brief Narrative:   HPI: [name] is a 69 y.o. 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 Pt presents to ED due to wound not looking good at dialysis. Wound vac without good seal, odor and purulent drainage from wound for the past couple of days. In the ED, WBC 11.3k, HGB 8.6 at baseline, X ray of knee: extensive soft tissue swelling at BKA site. Pt admitted for further management.   Assessment & Plan:   Principal Problem:   Infection of amputation stump, left lower extremity ([place]) Active Problems:   Essential hypertension   ESRD on dialysis ([place])   Blindness of both eyes   Chronic combined systolic and diastolic CHF (congestive heart failure) (HCC)   Anemia in ESRD (end-stage renal disease) ([place])   Type 2 diabetes mellitus with diabetic polyneuropathy (HCC)   Infection of LLE BKA stump status post left AKA on 10/21/2019 Currently afebrile, with resolving leukocytosis Management as per vascular surgery on board  ESRD on HD MWF Nephrology on board  Anemia in ESRD/acute blood loss anemia Hemoglobin dropped to 6.8 on 10/22/19 s/p 1 unit of PRBC Further management as per nephrology Daily CBC  Diabetes mellitus type 2  A1c 6 on 10/19/2019  Continue SSI, Accu-Cheks, hypoglycemic protocol  Not on any medications at home  Essential hypertension BP soft, asymptomatic Continue to hold home amlodipine, Coreg, lisinopril     DVT prophylaxis: Heparin [place]   Code Status: Full Code  Family Communication: Discussed extensively with patient at bedside Patient is from: Home Disposition Plan: Patient/family refusing SNF, wants to be discharged home to continue home health care Barriers to discharge: Therapy  assessment and clearance from vascular surgery.   Estimated body mass index is 29.5 kg/m as calculated from the following:   Height as of this encounter: 5\' 5"  (1.651 m).   Weight as of this encounter: 80.4 kg.  Pressure Injury 09/30/19 Heel Right Unstageable - Full thickness tissue loss in which the base of the injury is covered by slough (yellow, tan, gray, green or brown) and/or eschar (tan, brown or black) in the wound bed. black tender to touch small open r (Active)  09/30/19 2153  Location: Heel  Location Orientation: Right  Staging: Unstageable - Full thickness tissue loss in which the base of the injury is covered by slough (yellow, tan, gray, green or brown) and/or eschar (tan, brown or black) in the wound bed.  Wound Description (Comments): black tender to touch small open redden area  Present on Admission: Yes     Nutritional status:  Nutrition Problem: Increased nutrient needs Etiology: wound healing   Signs/Symptoms: estimated needs   Interventions: Nepro shake, MVI, Prostat    Consultants:   Vascular surgery  Nephrology  Procedures:   AKA  Antimicrobials:   None   Subjective: Patient seen and examined at bedside.  Still complains of some postop pain around the stump site.  Denies any other complaints, denies chest pain, shortness of breath, abdominal pain, nausea/vomiting, fever/chills, dizziness.  Objective: Vitals:   10/23/19 0333 10/23/19 0450 10/23/19 0804 10/23/19 1200  BP: (!) 105/35  (!) 106/24 (!) 83/28  Pulse: 69  68 73  Resp: 14  16 16   Temp: 97.9 F (36.6  C)  98.7 F (37.1 C) 98.8 F (37.1 C)  TempSrc: Oral  Oral Oral  SpO2: 95%  100% 92%  Weight:  80.4 kg    Height:        Intake/Output Summary (Last 24 hours) at 10/23/2019 1339 Last data filed at 10/23/2019 0400 Gross per 24 hour  Intake 300 ml  Output --  Net 300 ml   Filed Weights   10/21/19 1807 10/22/19 0500 10/23/19 0450  Weight: 82 kg 82.3 kg 80.4 kg     Examination:  General: NAD, legally blind bilaterally  Cardiovascular: S1, S2 present  Respiratory: CTAB  Abdomen: Soft, nontender, nondistended, bowel sounds present  Musculoskeletal: Dressing noted in left lower extremity, right lower extremity with no pedal edema  Skin: Normal  Psychiatry: Normal mood   Data Reviewed: I have personally reviewed following labs and imaging studies  CBC: Recent Labs  Lab 10/19/19 1957 10/19/19 1957 10/20/19 0123 10/20/19 0123 10/21/19 0423 10/21/19 1902 10/22/19 0255 10/22/19 1516 10/23/19 0346  WBC 11.3*   < > 10.9*  --  10.3 13.0* 11.7*  --  11.1*  NEUTROABS 7.5  --   --   --   --   --   --   --   --   HGB 8.6*   < > 8.4*   < > 8.3* 8.4* 6.8* 7.9* 7.7*  HCT 26.8*   < > 26.8*   < > 25.6* 26.2* 21.4* 24.6* 23.9*  MCV 95.7   < > 95.7  --  94.5 96.0 95.1  --  94.5  PLT 258   < > 248  --  268 330 286  --  305   < > = values in this interval not displayed.   Basic Metabolic Panel: Recent Labs  Lab 10/19/19 1957 10/19/19 1957 10/20/19 0123 10/21/19 0423 10/21/19 1902 10/22/19 0255 10/23/19 0346  NA 138  --  139 138  --  140 137  K 3.3*  --  3.5 3.9  --  4.4 4.6  CL 93*  --  93* 93*  --  97* 96*  CO2 30  --  30 25  --  27 27  GLUCOSE 147*  --  137* 108*  --  118* 125*  BUN 12  --  12 25*  --  17 32*  CREATININE 4.35*   < > 4.59* 6.64* 3.18* 4.32* 6.13*  CALCIUM 8.2*  --  8.3* 8.8*  --  8.4* 8.4*   < > = values in this interval not displayed.   GFR: Estimated Creatinine Clearance: 9.5 mL/min (A) (by C-G formula based on SCr of 6.13 mg/dL (H)). Liver Function Tests: Recent Labs  Lab 10/19/19 1957  AST 31  ALT 22  ALKPHOS 212*  BILITOT 1.0  PROT 6.5  ALBUMIN 2.7*   No results for input(s): LIPASE, AMYLASE in the last 168 hours. No results for input(s): AMMONIA in the last 168 hours. Coagulation Profile: No results for input(s): INR, PROTIME in the last 168 hours. Cardiac Enzymes: No results for input(s):  CKTOTAL, CKMB, CKMBINDEX, TROPONINI in the last 168 hours. BNP (last 3 results) No results for input(s): PROBNP in the last 8760 hours. HbA1C: No results for input(s): HGBA1C in the last 72 hours. CBG: Recent Labs  Lab 10/22/19 2014 10/22/19 2305 10/23/19 0346 10/23/19 0800 10/23/19 1140  GLUCAP 144* 169* 116* 138* 171*   Lipid Profile: No results for input(s): CHOL, HDL, LDLCALC, TRIG, CHOLHDL, LDLDIRECT in the last 72 hours.  Thyroid Function Tests: No results for input(s): TSH, T4TOTAL, FREET4, T3FREE, THYROIDAB in the last 72 hours. Anemia Panel: No results for input(s): VITAMINB12, FOLATE, FERRITIN, TIBC, IRON, RETICCTPCT in the last 72 hours. Sepsis Labs: Recent Labs  Lab 10/19/19 1957 10/19/19 2137  LATICACIDVEN 2.3* 1.6    Recent Results (from the past 240 hour(s))  Culture, blood (Routine x 2)     Status: None (Preliminary result)   Collection Time: 10/19/19  7:45 PM   Specimen: BLOOD RIGHT ARM  Result Value Ref Range Status   Specimen Description BLOOD RIGHT ARM  Final   Special Requests   Final    BOTTLES DRAWN AEROBIC AND ANAEROBIC Blood Culture results may not be optimal due to an excessive volume of blood received in culture bottles   Culture   Final    NO GROWTH 4 DAYS Performed at [facility], [place] [address] [place], [place] [address]    Report Status PENDING  Incomplete  Culture, blood (Routine x 2)     Status: None (Preliminary result)   Collection Time: 10/19/19  7:59 PM   Specimen: BLOOD LEFT HAND  Result Value Ref Range Status   Specimen Description BLOOD LEFT HAND  Final   Special Requests   Final    BOTTLES DRAWN AEROBIC ONLY Blood Culture adequate volume   Culture   Final    NO GROWTH 4 DAYS Performed at [facility], [place] [address], [place], [place] [address]    Report Status PENDING  Incomplete  SARS CORONAVIRUS 2 (TAT 6-24 HRS) Nasopharyngeal Nasopharyngeal Swab     Status: None   Collection Time: 10/19/19 10:12 PM    Specimen: Nasopharyngeal Swab  Result Value Ref Range Status   SARS Coronavirus 2 NEGATIVE NEGATIVE Final    Comment: (NOTE) SARS-CoV-2 target nucleic acids are NOT DETECTED. The SARS-CoV-2 RNA is generally detectable in upper and lower respiratory specimens during the acute phase of infection. Negative results do not preclude SARS-CoV-2 infection, do not rule out co-infections with other pathogens, and should not be used as the sole basis for treatment or other patient management decisions. Negative results must be combined with clinical observations, patient history, and epidemiological information. The expected result is Negative. Fact Sheet for Patients: SugarRoll.be Fact Sheet for Healthcare Providers: https://www.woods-mathews.com/ This test is not yet approved or cleared by the [place] FDA and  has been authorized for detection and/or diagnosis of SARS-CoV-2 by FDA under an Emergency Use Authorization (EUA). This EUA will remain  in effect (meaning this test can be used) for the duration of the COVID-19 declaration under Section 56 4(b)(1) of the Act, 21 U.S.C. section 360bbb-3(b)(1), unless the authorization is terminated or revoked sooner. Performed at [facility], [place] [address] [place], [place] [address]   MRSA PCR Screening     Status: None   Collection Time: 10/20/19 11:29 PM   Specimen: Nasopharyngeal  Result Value Ref Range Status   MRSA by PCR NEGATIVE NEGATIVE Final    Comment:        The GeneXpert MRSA Assay (FDA approved for NASAL specimens only), is one component of a comprehensive MRSA colonization surveillance program. It is not intended to diagnose MRSA infection nor to guide or monitor treatment for MRSA infections. Performed at [facility], [place] [address] [place], [place] [address]       Radiology Studies: No results found.  Scheduled Meds: . sodium chloride   Intravenous Once  .  atropine  1 drop Right Eye BID  . Chlorhexidine Gluconate Cloth  6 each Topical Q0600  . [START ON 10/24/2019] darbepoetin (ARANESP) injection - DIALYSIS  150 mcg Intravenous Q Mon-HD  . diclofenac Sodium  2 g Topical QID  . diphenhydrAMINE  25 mg Oral QHS  . docusate sodium  100 mg Oral BID WC  . DULoxetine  30 mg Oral Daily  . feeding supplement (NEPRO CARB STEADY)  237 mL Oral BID BM  . feeding supplement (PRO-STAT SUGAR FREE 64)  30 mL Oral Daily  . gabapentin  100 mg Oral BID  . heparin  5,000 Units Subcutaneous Q8H  . insulin aspart  0-6 Units Subcutaneous Q4H  . multivitamin  1 tablet Oral QHS  . pantoprazole  80 mg Oral BID  . prednisoLONE acetate  1 drop Right Eye BID  . traMADol  25 mg Oral Q3H   Continuous Infusions: . sodium chloride    . sodium chloride    . magnesium sulfate bolus IVPB       LOS: 4 days      [place] Friendly, MD [facility]  10/23/2019, 1:39 PM   To contact the attending provider between 7A-7P or the covering provider during after hours 7P-7A, please log into the web site [email or web address].

## 2019-10-24 LAB — BASIC METABOLIC PANEL
Anion gap: 16 — ABNORMAL HIGH (ref 5–15)
BUN: 40 mg/dL — ABNORMAL HIGH (ref 8–23)
CO2: 26 mmol/L (ref 22–32)
Calcium: 8.4 mg/dL — ABNORMAL LOW (ref 8.9–10.3)
Chloride: 95 mmol/L — ABNORMAL LOW (ref 98–111)
Creatinine, Ser: 7.64 mg/dL — ABNORMAL HIGH (ref 0.44–1.00)
GFR calc Af Amer: 6 mL/min — ABNORMAL LOW (ref >60)
GFR calc non Af Amer: 5 mL/min — ABNORMAL LOW (ref >60)
Glucose, Bld: 142 mg/dL — ABNORMAL HIGH (ref 70–99)
Potassium: 4.4 mmol/L (ref 3.5–5.1)
Sodium: 137 mmol/L (ref 135–145)

## 2019-10-24 LAB — CBC WITH DIFFERENTIAL/PLATELET
Abs Immature Granulocytes: 0.05 10*3/uL (ref 0.00–0.07)
Basophils Absolute: 0.1 10*3/uL (ref 0.0–0.1)
Basophils Relative: 1 %
Eosinophils Absolute: 0.4 10*3/uL (ref 0.0–0.5)
Eosinophils Relative: 4 %
HCT: 25.1 % — ABNORMAL LOW (ref 36.0–46.0)
Hemoglobin: 8.1 g/dL — ABNORMAL LOW (ref 12.0–15.0)
Immature Granulocytes: 1 %
Lymphocytes Relative: 23 %
Lymphs Abs: 2.5 10*3/uL (ref 0.7–4.0)
MCH: 31.4 pg (ref 26.0–34.0)
MCHC: 32.3 g/dL (ref 30.0–36.0)
MCV: 97.3 fL (ref 80.0–100.0)
Monocytes Absolute: 1.3 10*3/uL — ABNORMAL HIGH (ref 0.1–1.0)
Monocytes Relative: 12 %
Neutro Abs: 6.5 10*3/uL (ref 1.7–7.7)
Neutrophils Relative %: 59 %
Platelets: 348 10*3/uL (ref 150–400)
RBC: 2.58 MIL/uL — ABNORMAL LOW (ref 3.87–5.11)
RDW: 21.9 % — ABNORMAL HIGH (ref 11.5–15.5)
WBC: 11.2 10*3/uL — ABNORMAL HIGH (ref 4.0–10.5)
nRBC: 0.4 % — ABNORMAL HIGH (ref 0.0–0.2)

## 2019-10-24 LAB — GLUCOSE, CAPILLARY
Glucose-Capillary: 145 mg/dL — ABNORMAL HIGH (ref 70–99)
Glucose-Capillary: 117 mg/dL — ABNORMAL HIGH (ref 70–99)

## 2019-10-24 LAB — CULTURE, BLOOD (ROUTINE X 2)
Culture: NO GROWTH
Culture: NO GROWTH
Special Requests: ADEQUATE

## 2019-10-24 LAB — SURGICAL PATHOLOGY

## 2019-10-24 MED ORDER — DARBEPOETIN ALFA 150 MCG/0.3ML IJ SOSY
PREFILLED_SYRINGE | INTRAMUSCULAR | Status: AC
  Filled 2019-10-24: qty 0.3

## 2019-10-24 MED ORDER — SODIUM CHLORIDE 0.9 % IV SOLN: 100.0000 mL | INTRAVENOUS | Status: DC | PRN

## 2019-10-24 MED ORDER — LISINOPRIL 40 MG PO TABS: 20.0000 mg | ORAL_TABLET | Freq: Every day | ORAL | 0 refills | Status: DC

## 2019-10-24 MED ORDER — CARVEDILOL 25 MG PO TABS: 12.5000 mg | ORAL_TABLET | Freq: Two times a day (BID) | ORAL | 0 refills | Status: DC

## 2019-10-24 NOTE — Progress Notes (Signed)
[facility]  Subjective  - Doing well over all.  No new complaints of pain issues.   Objective (!) 104/39 70 97.7 F (36.5 C) (Oral) 10 94%  Intake/Output Summary (Last 24 hours) at 10/24/2019 0852 Last data filed at 10/24/2019 0448 Gross per 24 hour  Intake 400 ml  Output 1 ml  Net 399 ml   Left AKA staples intact, healing well without active bleeding. Thigh warm to touch, soft without evidence of hematoma. Currently on HD   Assessment/Planning: Left AKA  Healing well will maintain staples for 4 weeks total.  F/U in our office in 4 weeks. I will order retention sock for AKA from [employer] today.  Roxy Horseman 10/24/2019 8:52 AM --  Laboratory Lab Results: Recent Labs    10/23/19 0346 10/24/19 0224  WBC 11.1* 11.2*  HGB 7.7* 8.1*  HCT 23.9* 25.1*  PLT 305 348   BMET Recent Labs    10/23/19 0346 10/24/19 0224  NA 137 137  K 4.6 4.4  CL 96* 95*  CO2 27 26  GLUCOSE 125* 142*  BUN 32* 40*  CREATININE 6.13* 7.64*  CALCIUM 8.4* 8.4*    COAG Lab Results  Component Value Date   INR 1.16 05/07/2018   INR 1.13 02/06/2018   INR 1.03 06/17/2013   No results found for: PTT

## 2019-10-24 NOTE — Progress Notes (Signed)
[facility] Progress Note   Subjective: Seen on HD. No issues with treatment. A & O X 3 today. No C/Os at present.    Objective Vitals:   10/24/19 0718 10/24/19 0730 10/24/19 0800 10/24/19 0830  BP: (!) 112/31 (!) 104/38 (!) 115/42 (!) 104/39  Pulse: 73 71 (P) 72 70  Resp: 10     Temp:      TempSrc:      SpO2: 92% 94% (P) 97%   Weight:      Height:       Physical Exam General: Chronically ill appearing female, nad, blind  Heart: RRR.  Harsh 2/6 systolic murmur.  Lungs: CTAB Abdomen: soft, non-tender  Extremities: L AKA with drsg, no drainage, no stump edema no RLE edema,  Dialysis Access: R AVF blood lines connected  Additional Objective Labs: Basic Metabolic Panel: Recent Labs  Lab 10/22/19 0255 10/23/19 0346 10/24/19 0224  NA 140 137 137  K 4.4 4.6 4.4  CL 97* 96* 95*  CO2 27 27 26   GLUCOSE 118* 125* 142*  BUN 17 32* 40*  CREATININE 4.32* 6.13* 7.64*  CALCIUM 8.4* 8.4* 8.4*   Liver Function Tests: Recent Labs  Lab 10/19/19 1957  AST 31  ALT 22  ALKPHOS 212*  BILITOT 1.0  PROT 6.5  ALBUMIN 2.7*   No results for input(s): LIPASE, AMYLASE in the last 168 hours. CBC: Recent Labs  Lab 10/19/19 1957 10/20/19 0123 10/21/19 0423 10/21/19 0423 10/21/19 1902 10/21/19 1902 10/22/19 0255 10/22/19 0255 10/22/19 1516 10/23/19 0346 10/24/19 0224  WBC 11.3*   < > 10.3   < > 13.0*   < > 11.7*  --   --  11.1* 11.2*  NEUTROABS 7.5  --   --   --   --   --   --   --   --   --  6.5  HGB 8.6*   < > 8.3*   < > 8.4*   < > 6.8*   < > 7.9* 7.7* 8.1*  HCT 26.8*   < > 25.6*   < > 26.2*   < > 21.4*   < > 24.6* 23.9* 25.1*  MCV 95.7   < > 94.5  --  96.0  --  95.1  --   --  94.5 97.3  PLT 258   < > 268   < > 330   < > 286  --   --  305 348   < > = values in this interval not displayed.   Blood Culture    Component Value Date/Time   SDES BLOOD LEFT HAND 10/19/2019 1959   SPECREQUEST  10/19/2019 1959    BOTTLES DRAWN AEROBIC ONLY Blood Culture  adequate volume   CULT  10/19/2019 1959    NO GROWTH 4 DAYS Performed at [facility], [place] [address], [place], [place] [address]    REPTSTATUS PENDING 10/19/2019 1959    Cardiac Enzymes: No results for input(s): CKTOTAL, CKMB, CKMBINDEX, TROPONINI in the last 168 hours. CBG: Recent Labs  Lab 10/23/19 1140 10/23/19 1603 10/23/19 1944 10/23/19 2317 10/24/19 0413  GLUCAP 171* 156* 159* 245* 145*   Iron Studies: No results for input(s): IRON, TIBC, TRANSFERRIN, FERRITIN in the last 72 hours. @lablastinr3 @ Studies/Results: No results found. Medications: . sodium chloride    . sodium chloride    . [START ON 10/25/2019] sodium chloride    . [START ON 10/25/2019] sodium chloride    . magnesium sulfate  bolus IVPB     . sodium chloride   Intravenous Once  . atropine  1 drop Right Eye BID  . Chlorhexidine Gluconate Cloth  6 each Topical Q0600  . darbepoetin (ARANESP) injection - DIALYSIS  150 mcg Intravenous Q Mon-HD  . diclofenac Sodium  2 g Topical QID  . diphenhydrAMINE  25 mg Oral QHS  . docusate sodium  100 mg Oral BID WC  . DULoxetine  30 mg Oral Daily  . feeding supplement (NEPRO CARB STEADY)  237 mL Oral BID BM  . feeding supplement (PRO-STAT SUGAR FREE 64)  30 mL Oral Daily  . gabapentin  100 mg Oral BID  . heparin  5,000 Units Subcutaneous Q8H  . insulin aspart  0-6 Units Subcutaneous Q4H  . multivitamin  1 tablet Oral QHS  . pantoprazole  80 mg Oral BID  . prednisoLONE acetate  1 drop Right Eye BID  . traMADol  25 mg Oral Q3H     [place] MWF4 hrs 180NRe 400/Autoflow 1.5 87kg 2.0 K/ 2.25 Ca UFP 2 R AVF  -No heparin -Mircera 100 mcg IV q 2 weeks (last dose143mcg IV 10/10/2019; Hb 8.4 3/24);  -Venofer 100 mg IV weekly -Hectorol41mcg IV TIW(phos 5.8, ca 9.0 on 10/05/19; PTH 174 on 3/3)  Assessment/Plan 1. 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 Donnetta Hutching  2. PAD-UlcerRheel. Per primary. Following with [facility].  3. ESRD - MWF. HD today on  schedule. No heparin K+4.4. 4. Hypertension/volume - BP controlled/low. Holding home meds d/t low BP. Well below EDW by weights here.  UF as tolerated. Will have lower EDW at discharge.  5. Anemia -HGB 8.1 today Transfused 1 unit prbcs 4/3.Next ESA dose due 4/5, increased to 150 mcg.  Hold IV iron with infection. 6. Metabolic bone disease - Continue binders, VDRA 7. Nutrition -Renal/carb mod diet. Add nepro/renal vit. 8. DM-per primary  Jimmye Norman. [place] Silveria NP-C 10/24/2019, 9:05 AM  [employer] [phone]

## 2019-10-24 NOTE — Consult Note (Signed)
PV Navigator Consult acknowledged and chart reviewed. Currently following this patient since L BKA 08/2019.  Patient is currently in HD, however was able to speak with her sister [name] [phone]), who is her primary caregiver at home.   Sister reports that patient is doing well and that they are preparing for her upcoming discharge. Patient not considered a good candidate for CIR due to activity intolerance. PT/OT services have  recommended SNF at discharge however, patient has denied and wishes to return home with her sister and adult children with [facility] services [facility] PT/RN/MSW) that she had prior to this hospitalization. Sister states CSW is ordering a trapeze for hospital bed but she is unaware of any additional equipment needs at this time.  Patient has a follow up appointment with [facility] 11/24/19 at 3:00 for follow up. She states that they will consider future needs of RLE pending the progress of L AKA healing at that appointment.  She denies any additional barriers or questions at this time. She voices that she has retained PV Navigator contact information should needs arise.  Thank you, Cletis Media RN BSN CWS [place] [phone]

## 2019-10-24 NOTE — Discharge Summary (Signed)
Discharge Summary  [name]:[id number] DOB: Nov 11, 1950  PCP: Prince Solian, MD  Admit date: 10/19/2019 Discharge date: 10/24/2019  Time spent: 40 mins   Recommendations for Outpatient Follow-up:  1. PCP in 1 week, for repeat labs and blood pressure monitoring 2. Vascular surgery in 4 weeks   Discharge Diagnoses:  Active Hospital Problems   Diagnosis Date Noted  . Infection of amputation stump, left lower extremity ([place]) 10/19/2019  . Anemia in ESRD (end-stage renal disease) ([place]) 05/06/2018  . Chronic combined systolic and diastolic CHF (congestive heart failure) ([place]) 07/06/2017  . ESRD on dialysis ([place]) 06/04/2017  . Blindness of both eyes 06/04/2017  . Essential hypertension 10/27/2016  . Type 2 diabetes mellitus with diabetic polyneuropathy ([place]) 06/21/2013    Resolved Hospital Problems  No resolved problems to display.    Discharge Condition: Stable  Diet recommendation: Renal diet/heart healthy  Vitals:   10/24/19 1037 10/24/19 1107  BP: (!) 120/35 (!) 118/34  Pulse: 82 83  Resp: 14 11  Temp: 98.6 F (37 C) 98.5 F (36.9 C)  SpO2: 95% 93%    History of present illness:  Cathy Dickman Beattyis a 69 y.o.femalewith medical history significant ofESRD on MWF dialysis, blindness from glaucoma, CHF, DM2, HTN, PAD. 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 Pt presents to ED due to wound not looking good at dialysis. Wound vac without good seal, odor and purulent drainage from wound for the past couple of days. In the ED, WBC 11.3k, HGB 8.6 at baseline, X ray of knee: extensive soft tissue swelling at BKA site. Pt admitted for further management.   Today saw pt during HD. Patient denies any new complaints, reports postop pain is tolerable.  Patient stable to discharge home with home health therapy.  Patient and family refused SNF placement.    Hospital Course:  Principal Problem:   Infection of  amputation stump, left lower extremity ([place]) Active Problems:   Essential hypertension   ESRD on dialysis ([place])   Blindness of both eyes   Chronic combined systolic and diastolic CHF (congestive heart failure) ([facility])   Anemia in ESRD (end-stage renal disease) ([place])   Type 2 diabetes mellitus with diabetic polyneuropathy ([facility])   Infection of LLE BKA stump status post left AKA on 10/21/2019 Currently afebrile, with resolving leukocytosis Follow-up with vascular surgery as an outpatient in 4 weeks  ESRD on HD MWF Follow-up with outpatient dialysis  Anemia in ESRD/acute blood loss anemia Hemoglobin dropped to 6.8 on 10/22/19 s/p 1 unit of PRBC Further management as per nephrology/PCP  Diabetes mellitus type 2  A1c 6 on 10/19/2019  Continue home regimen  Essential hypertension BP soft, asymptomatic Adjusted BP meds, discontinued amlodipine, reduced Coreg to 12.5 mg BID, reduced lisinopril to 20 mg daily PCP/nephrology to adjust accordingly as an outpatient       Malnutrition Type:  Nutrition Problem: Increased nutrient needs Etiology: wound healing   Malnutrition Characteristics:  Signs/Symptoms: estimated needs   Nutrition Interventions:  Interventions: Nepro shake, MVI, Prostat   Estimated body mass index is 29.61 kg/m as calculated from the following:   Height as of this encounter: 5\' 5"  (1.651 m).   Weight as of this encounter: 80.7 kg.    Procedures:  AKA  Consultations:  Vascular surgery  Nephrology  Discharge Exam: BP (!) 118/34 (BP Location: Left Arm)   Pulse 83   Temp 98.5 F (36.9 C) (Oral)   Resp  11   Ht 5\' 5"  (1.651 m)   Wt 80.7 kg   LMP  (LMP Unknown)   SpO2 93%   BMI 29.61 kg/m   General: NAD Cardiovascular: S1, S2 present Respiratory: CTA B  Discharge Instructions You were cared for by a hospitalist during your hospital stay. If you have any questions about your discharge medications or the care you received while you were in  the hospital after you are discharged, you can call the unit and asked to speak with the hospitalist on call if the hospitalist that took care of you is not available. Once you are discharged, your primary care physician will handle any further medical issues. Please note that NO REFILLS for any discharge medications will be authorized once you are discharged, as it is imperative that you return to your primary care physician (or establish a relationship with a primary care physician if you do not have one) for your aftercare needs so that they can reassess your need for medications and monitor your lab values.  Discharge Instructions    Diet - low sodium heart healthy   Complete by: As directed    Increase activity slowly   Complete by: As directed      Allergies as of 10/24/2019      Reactions   Tape Itching, Rash   73M Transpore adhesive tape. Medical tape pulls off the skin!! PAPER TAPE ONLY, PLEASE   Latex Hives   Oxycodone Other (See Comments)   Hallucinations    Tramadol Other (See Comments)   Hallucinations with a full tablet   Morphine And Related Other (See Comments)   Hallucinations   Vicodin [hydrocodone-acetaminophen] Other (See Comments)   Hallucinations      Medication List    STOP taking these medications   amLODipine 5 MG tablet Commonly known as: NORVASC     TAKE these medications   acetaminophen 500 MG tablet Commonly known as: TYLENOL Take 1,000 mg by mouth 4 (four) times daily.   albuterol 108 (90 Base) MCG/ACT inhaler Commonly known as: VENTOLIN HFA Inhale 2 puffs into the lungs every 6 (six) hours as needed for wheezing or shortness of breath.   albuterol (2.5 MG/73ML) 0.083% nebulizer solution Commonly known as: PROVENTIL Take 3 mLs (2.5 mg total) by nebulization every 6 (six) hours as needed for wheezing or shortness of breath.   aspirin EC 81 MG tablet Take 81 mg by mouth daily.   atropine 1 % ophthalmic solution Place 1 drop into the right eye 2  (two) times daily.   B-D SINGLE USE SWABS REGULAR Pads in the morning, at noon, and at bedtime.   BD Pen Needle Nano 2nd Gen 32G X 4 MM Misc Generic drug: Insulin Pen Needle in the morning, at noon, and at bedtime.   carvedilol 25 MG tablet Commonly known as: COREG Take 0.5 tablets (12.5 mg total) by mouth 2 (two) times daily. What changed: how much to take   clopidogrel 75 MG tablet Commonly known as: Plavix Take 1 tablet (75 mg total) by mouth daily.   darbepoetin 200 MCG/0.4ML Soln injection Commonly known as: ARANESP Inject 0.4 mLs (200 mcg total) into the vein every Wednesday with hemodialysis.   diclofenac sodium 1 % Gel Commonly known as: VOLTAREN Apply 2 g topically See admin instructions. Apply 2 grams to painful sites four times a day   diphenhydrAMINE 25 MG tablet Commonly known as: BENADRYL Take 25 mg by mouth at bedtime.   docusate sodium 100  MG capsule Commonly known as: COLACE Take 100 mg by mouth 2 (two) times daily with a meal.   doxercalciferol 4 MCG/2ML injection Commonly known as: HECTOROL Inject 0.5 mLs (1 mcg total) into the vein every Monday, Wednesday, and Friday with hemodialysis.   DULoxetine 30 MG capsule Commonly known as: CYMBALTA Take 30 mg by mouth daily.   fluticasone 50 MCG/ACT nasal spray Commonly known as: FLONASE Place 2 sprays into both nostrils at bedtime.   gabapentin 100 MG capsule Commonly known as: NEURONTIN Take 100 mg by mouth 2 (two) times daily.   lanthanum 1000 MG chewable tablet Commonly known as: FOSRENOL Chew 1,000-4,000 mg by mouth See admin instructions. Chew 4,000 mg by mouth with each meal and 1,000 mg with each snack   lisinopril 40 MG tablet Commonly known as: ZESTRIL Take 0.5 tablets (20 mg total) by mouth at bedtime. What changed: how much to take   mupirocin ointment 2 % Commonly known as: BACTROBAN Apply to affected area right foot once daily What changed:   how much to take  how to take  this  when to take this   NovoLOG FlexPen 100 UNIT/ML FlexPen Generic drug: insulin aspart Inject 6-10 Units into the skin See admin instructions. Inject 6-10 into the skin three times a day with meals, per sliding scale   omeprazole 40 MG capsule Commonly known as: PRILOSEC Take 1 capsule (40 mg total) by mouth daily. What changed: when to take this   OneTouch Verio test strip Generic drug: glucose blood 1 each by Other route 3 (three) times daily.   prednisoLONE acetate 1 % ophthalmic suspension Commonly known as: PRED FORTE Place 1 drop into the right eye 2 (two) times daily.   Rena-Vite Rx 1 MG Tabs Take 1 tablet by mouth daily with breakfast.   traMADol 50 MG tablet Commonly known as: Ultram Take 1 tablet (50 mg total) by mouth every 6 (six) hours as needed. What changed:   how much to take  when to take this      Allergies  Allergen Reactions  . Tape Itching and Rash    47M Transpore adhesive tape. Medical tape pulls off the skin!! PAPER TAPE ONLY, PLEASE  . Latex Hives  . Oxycodone Other (See Comments)    Hallucinations   . Tramadol Other (See Comments)    Hallucinations with a full tablet  . Morphine And Related Other (See Comments)    Hallucinations   . Vicodin [Hydrocodone-Acetaminophen] Other (See Comments)    Hallucinations    Follow-up Information    Early, Arvilla Meres, MD Follow up in 4 week(s).   Specialties: Vascular Surgery, Cardiology Contact information: [address] [place] [address] [phone]        Prince Solian, MD. Schedule an appointment as soon as possible for a visit in 1 week(s).   Specialty: Internal Medicine Contact information: [place] [address] [phone]            The results of significant diagnostics from this hospitalization (including imaging, microbiology, ancillary and laboratory) are listed below for reference.    Significant Diagnostic Studies: DG Knee Complete 4 Views  Left  Result Date: 10/19/2019 CLINICAL DATA:  Left below-knee amputation, soft tissue infection EXAM: LEFT KNEE - COMPLETE 4+ VIEW COMPARISON:  None. FINDINGS: Frontal, bilateral oblique, lateral views of the left knee are obtained. Prior left below-knee amputation. Sharp margin of the tibial amputation site. Slight irregularity at the fibular amputation site with small  ossific density displaced inferiorly, likely related to previous surgery. There is extensive soft tissue swelling of the amputation stump. Extensive vascular calcifications are seen. No joint effusion. No acute fractures. IMPRESSION: 1. Extensive soft tissue swelling at the left below-knee amputation site. 2. No acute fracture. Electronically Signed   By: Randa Ngo M.D.   On: 10/19/2019 21:42    Microbiology: Recent Results (from the past 240 hour(s))  Culture, blood (Routine x 2)     Status: None   Collection Time: 10/19/19  7:45 PM   Specimen: BLOOD RIGHT ARM  Result Value Ref Range Status   Specimen Description BLOOD RIGHT ARM  Final   Special Requests   Final    BOTTLES DRAWN AEROBIC AND ANAEROBIC Blood Culture results may not be optimal due to an excessive volume of blood received in culture bottles   Culture   Final    NO GROWTH 5 DAYS Performed at [facility], [place] [address], [place], [place] [address]    Report Status 10/24/2019 FINAL  Final  Culture, blood (Routine x 2)     Status: None   Collection Time: 10/19/19  7:59 PM   Specimen: BLOOD LEFT HAND  Result Value Ref Range Status   Specimen Description BLOOD LEFT HAND  Final   Special Requests   Final    BOTTLES DRAWN AEROBIC ONLY Blood Culture adequate volume   Culture   Final    NO GROWTH 5 DAYS Performed at [facility], [place] [address], [place], [place] [address]    Report Status 10/24/2019 FINAL  Final  SARS CORONAVIRUS 2 (TAT 6-24 HRS) Nasopharyngeal Nasopharyngeal Swab     Status: None   Collection Time: 10/19/19 10:12 PM   Specimen:  Nasopharyngeal Swab  Result Value Ref Range Status   SARS Coronavirus 2 NEGATIVE NEGATIVE Final    Comment: (NOTE) SARS-CoV-2 target nucleic acids are NOT DETECTED. The SARS-CoV-2 RNA is generally detectable in upper and lower respiratory specimens during the acute phase of infection. Negative results do not preclude SARS-CoV-2 infection, do not rule out co-infections with other pathogens, and should not be used as the sole basis for treatment or other patient management decisions. Negative results must be combined with clinical observations, patient history, and epidemiological information. The expected result is Negative. Fact Sheet for Patients: SugarRoll.be Fact Sheet for Healthcare Providers: https://www.woods-mathews.com/ This test is not yet approved or cleared by the [place] FDA and  has been authorized for detection and/or diagnosis of SARS-CoV-2 by FDA under an Emergency Use Authorization (EUA). This EUA will remain  in effect (meaning this test can be used) for the duration of the COVID-19 declaration under Section 56 4(b)(1) of the Act, 21 U.S.C. section 360bbb-3(b)(1), unless the authorization is terminated or revoked sooner. Performed at [facility], [place] [address], [place], [place] [address]   MRSA PCR Screening     Status: None   Collection Time: 10/20/19 11:29 PM   Specimen: Nasopharyngeal  Result Value Ref Range Status   MRSA by PCR NEGATIVE NEGATIVE Final    Comment:        The GeneXpert MRSA Assay (FDA approved for NASAL specimens only), is one component of a comprehensive MRSA colonization surveillance program. It is not intended to diagnose MRSA infection nor to guide or monitor treatment for MRSA infections. Performed at [facility], [place] [address], [place],  [address]      Labs: Basic Metabolic Panel: Recent Labs  Lab 10/20/19 0123 10/20/19 0123 10/21/19 0423 10/21/19 1902  10/22/19 0255 10/23/19 0346 10/24/19 0224  NA 139  --  138  --  140 137 137  K 3.5  --  3.9  --  4.4 4.6 4.4  CL 93*  --  93*  --  97* 96* 95*  CO2 30  --  25  --  27 27 26   GLUCOSE 137*  --  108*  --  118* 125* 142*  BUN 12  --  25*  --  17 32* 40*  CREATININE 4.59*   < > 6.64* 3.18* 4.32* 6.13* 7.64*  CALCIUM 8.3*  --  8.8*  --  8.4* 8.4* 8.4*   < > = values in this interval not displayed.   Liver Function Tests: Recent Labs  Lab 10/19/19 1957  AST 31  ALT 22  ALKPHOS 212*  BILITOT 1.0  PROT 6.5  ALBUMIN 2.7*   No results for input(s): LIPASE, AMYLASE in the last 168 hours. No results for input(s): AMMONIA in the last 168 hours. CBC: Recent Labs  Lab 10/19/19 1957 10/20/19 0123 10/21/19 0423 10/21/19 0423 10/21/19 1902 10/22/19 0255 10/22/19 1516 10/23/19 0346 10/24/19 0224  WBC 11.3*   < > 10.3  --  13.0* 11.7*  --  11.1* 11.2*  NEUTROABS 7.5  --   --   --   --   --   --   --  6.5  HGB 8.6*   < > 8.3*   < > 8.4* 6.8* 7.9* 7.7* 8.1*  HCT 26.8*   < > 25.6*   < > 26.2* 21.4* 24.6* 23.9* 25.1*  MCV 95.7   < > 94.5  --  96.0 95.1  --  94.5 97.3  PLT 258   < > 268  --  330 286  --  305 348   < > = values in this interval not displayed.   Cardiac Enzymes: No results for input(s): CKTOTAL, CKMB, CKMBINDEX, TROPONINI in the last 168 hours. BNP: BNP (last 3 results) No results for input(s): BNP in the last 8760 hours.  ProBNP (last 3 results) No results for input(s): PROBNP in the last 8760 hours.  CBG: Recent Labs  Lab 10/23/19 1603 10/23/19 1944 10/23/19 2317 10/24/19 0413 10/24/19 1112  GLUCAP 156* 159* 245* 145* 117*       Signed:  Alma Friendly, MD [facility] 10/24/2019, 3:26 PM

## 2019-10-24 NOTE — TOC Transition Note (Signed)
Transition of Care [facility]) - CM/SW Discharge Note Marvetta Gibbons RN, BSN Transitions of Care Unit 4E- RN Case Manager [phone]   Patient Details  Name: [name] MRN: [id number] Date of Birth: 1951/01/17  Transition of Care [facility]) CM/SW Contact:  Dawayne Patricia, RN Phone Number: 10/24/2019, 4:01 PM   Clinical Narrative:    Pt stable for transition home, has refused SNF and wants to return home with family and resume [place] services with [facility]- orders have been placed for HHRN/PT/PT/SW- call made to [place] with [facility] to confirm resumption of [place] services and notify of transition home today. [facility] to f/u with pt at home for resumption of care.  Sister at bedside for transport home.    Final next level of care: [place] Barriers to Discharge: Barriers Resolved   Patient Goals and CMS Choice Patient states their goals for this hospitalization and ongoing recovery are:: I'm nervous for surgery but I'm ready to be out of pain. CMS Medicare.gov Compare Post Acute Care list provided to:: Patient Choice offered to / list presented to : Patient, Sibling  Discharge Placement                 Home with [facility]      Discharge Plan and Services   Discharge Planning Services: CM Consult Post Acute Care Choice: Resumption of Svcs/PTA Provider          DME Arranged: (Patient will need a trapeze with swing attached to her hospital bed.) DME Agency: [facility]       HH Arranged: RN, PT, OT   Date [facility] Agency Contacted: 10/24/19 Time HH Agency Contacted: 1600 Representative spoke with at [place]: [place] ([place]) Interventions     Readmission Risk Interventions Readmission Risk Prevention Plan 10/24/2019 10/21/2019  Transportation Screening - Complete  PCP or Specialist Appt within 3-5 Days - Complete  HRI or [place] - Complete  Social Work Consult for [place] Planning/Counseling - Complete   Palliative Care Screening - Complete  Medication Review [employer]) - Complete  PCP or Specialist appointment within 3-5 days of discharge Complete -  [place] or [place] Complete -  SW Recovery Care/Counseling Consult Complete -  [place] Patient Refused -  Some recent data might be hidden

## 2019-10-24 NOTE — Progress Notes (Signed)
Inpatient Rehab Admissions:  Inpatient Rehab Consult received. Chart reviewed and note therapy recommendations are for SNF with pt demonstrating poor tolerance.  Will sign off for CIR at this time.   Signed: Shann Medal, PT, DPT Admissions Coordinator [phone] 10/24/19  9:33 AM

## 2019-10-24 NOTE — Progress Notes (Signed)
Orthopedic Tech Progress Note Patient Details:  JADIN KAGEL Apr 28, 1951 [id number] Called in order HANGER for an AKA RENTENTION SOCK  Patient ID: MANVI GUILLIAMS, female   DOB: [birth year], 69 y.o.   MRN: [id number]   Janit Pagan 10/24/2019, 1:48 PM

## 2019-10-25 ENCOUNTER — Ambulatory Visit: Payer: Medicare Other | Admitting: Podiatry

## 2019-10-25 ENCOUNTER — Telehealth: Payer: Self-pay | Admitting: Nephrology

## 2019-10-25 NOTE — Telephone Encounter (Signed)
Transition of care contact from inpatient facility  Date of discharge: 10/24/19 Date of contact: 10/25/19 Method: Phone Spoke to: Patient  Patient contacted to discuss transition of care from recent inpatient hospitalization. Patient was admitted to [facility] from 3/31- 10/24/19 with discharge diagnosis of infection of LLE BKA s/p left AKA.    Medication changes were reviewed.  Patient will follow up with his/her outpatient HD unit on: 10/26/19  Other f/u needs include: Awaiting contact with [facility] services.

## 2019-10-28 ENCOUNTER — Other Ambulatory Visit: Payer: Self-pay | Admitting: *Deleted

## 2019-10-28 ENCOUNTER — Encounter: Payer: Self-pay | Admitting: *Deleted

## 2019-10-28 DIAGNOSIS — N186 End stage renal disease: Secondary | ICD-10-CM | POA: Diagnosis not present

## 2019-10-28 DIAGNOSIS — E1142 Type 2 diabetes mellitus with diabetic polyneuropathy: Secondary | ICD-10-CM | POA: Diagnosis not present

## 2019-10-28 DIAGNOSIS — Z992 Dependence on renal dialysis: Secondary | ICD-10-CM | POA: Diagnosis not present

## 2019-10-28 DIAGNOSIS — D631 Anemia in chronic kidney disease: Secondary | ICD-10-CM | POA: Diagnosis not present

## 2019-10-28 DIAGNOSIS — N2581 Secondary hyperparathyroidism of renal origin: Secondary | ICD-10-CM | POA: Diagnosis not present

## 2019-10-28 NOTE — Patient Outreach (Signed)
[facility]) Care Management [facility] CM Telephone Outreach, EMMI Red Alert notification/ General Discharge PCP completes Transition of Care follow up post-hospital discharge Post-hospital discharge day # 4  10/28/2019  [name] May 13, 1951 [id number]  EMMI Red-Alert notification/ General Discharge EMMI call date/ day #: October 26, 2019; day # 1 Red-Alert reason(s):  "did not read discharge papers;" "Do not know who to call for changes in condition;" "No scheduled follow up;" and wounds not healing"  Unsuccessful initial outreach to [name], 69 y/o female referred to [facility] RN CM 10/27/19 by [facility] CMA for [place] notification as noted above; patient has recent hospitalization March 31- October 24, 2019 for ongoing concerns around (L) BKA wound (stump) infection; patient had surgery for (L) AKA on October 21, 2019; she was discharged home to self-care with home health services in place after refusing SNF rehabilitation placement.  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  HIPAA compliant voice mail message left for patient, requesting return call back.  Plan:  Will place [facility] Community CM unsuccessful patient outreach letter in mail requesting call back in writing  Will re-attempt [place] telephone outreach within 4 business days  if I do not hear back from patient first  Oneta Rack, RN, BSN, [employer] [facility] Care Management  [phone]

## 2019-10-31 DIAGNOSIS — N186 End stage renal disease: Secondary | ICD-10-CM | POA: Diagnosis not present

## 2019-10-31 DIAGNOSIS — D631 Anemia in chronic kidney disease: Secondary | ICD-10-CM | POA: Diagnosis not present

## 2019-10-31 DIAGNOSIS — Z992 Dependence on renal dialysis: Secondary | ICD-10-CM | POA: Diagnosis not present

## 2019-10-31 DIAGNOSIS — N2581 Secondary hyperparathyroidism of renal origin: Secondary | ICD-10-CM | POA: Diagnosis not present

## 2019-11-01 ENCOUNTER — Telehealth: Payer: Self-pay

## 2019-11-01 DIAGNOSIS — E785 Hyperlipidemia, unspecified: Secondary | ICD-10-CM | POA: Diagnosis not present

## 2019-11-01 DIAGNOSIS — N186 End stage renal disease: Secondary | ICD-10-CM | POA: Diagnosis not present

## 2019-11-01 DIAGNOSIS — E1129 Type 2 diabetes mellitus with other diabetic kidney complication: Secondary | ICD-10-CM | POA: Diagnosis not present

## 2019-11-01 DIAGNOSIS — I509 Heart failure, unspecified: Secondary | ICD-10-CM | POA: Diagnosis not present

## 2019-11-01 DIAGNOSIS — I739 Peripheral vascular disease, unspecified: Secondary | ICD-10-CM | POA: Diagnosis not present

## 2019-11-01 DIAGNOSIS — G8929 Other chronic pain: Secondary | ICD-10-CM | POA: Diagnosis not present

## 2019-11-01 DIAGNOSIS — Z89512 Acquired absence of left leg below knee: Secondary | ICD-10-CM | POA: Diagnosis not present

## 2019-11-01 DIAGNOSIS — D509 Iron deficiency anemia, unspecified: Secondary | ICD-10-CM | POA: Diagnosis not present

## 2019-11-01 NOTE — Telephone Encounter (Signed)
Bonnita Nasuti, RN with [facility] called.  Patient's incision looks "really good and staples look like they are ready to come out. They have been in x 2 wks".  Pt has an appt with [place] on 11/24/19.  Per Dr. Carlis Abbott, ok to take out staples if incision looks good.  Thurston Hole., LPN

## 2019-11-02 DIAGNOSIS — N2581 Secondary hyperparathyroidism of renal origin: Secondary | ICD-10-CM | POA: Diagnosis not present

## 2019-11-02 DIAGNOSIS — Z992 Dependence on renal dialysis: Secondary | ICD-10-CM | POA: Diagnosis not present

## 2019-11-02 DIAGNOSIS — N186 End stage renal disease: Secondary | ICD-10-CM | POA: Diagnosis not present

## 2019-11-02 DIAGNOSIS — D631 Anemia in chronic kidney disease: Secondary | ICD-10-CM | POA: Diagnosis not present

## 2019-11-03 ENCOUNTER — Other Ambulatory Visit: Payer: Self-pay | Admitting: *Deleted

## 2019-11-03 ENCOUNTER — Encounter: Payer: Self-pay | Admitting: *Deleted

## 2019-11-03 DIAGNOSIS — Z79899 Other long term (current) drug therapy: Secondary | ICD-10-CM | POA: Diagnosis not present

## 2019-11-03 DIAGNOSIS — K219 Gastro-esophageal reflux disease without esophagitis: Secondary | ICD-10-CM | POA: Diagnosis not present

## 2019-11-03 DIAGNOSIS — M797 Fibromyalgia: Secondary | ICD-10-CM | POA: Diagnosis not present

## 2019-11-03 DIAGNOSIS — F419 Anxiety disorder, unspecified: Secondary | ICD-10-CM | POA: Diagnosis not present

## 2019-11-03 DIAGNOSIS — I5042 Chronic combined systolic (congestive) and diastolic (congestive) heart failure: Secondary | ICD-10-CM | POA: Diagnosis not present

## 2019-11-03 DIAGNOSIS — Z794 Long term (current) use of insulin: Secondary | ICD-10-CM | POA: Diagnosis not present

## 2019-11-03 DIAGNOSIS — Z7902 Long term (current) use of antithrombotics/antiplatelets: Secondary | ICD-10-CM | POA: Diagnosis not present

## 2019-11-03 DIAGNOSIS — D631 Anemia in chronic kidney disease: Secondary | ICD-10-CM | POA: Diagnosis not present

## 2019-11-03 DIAGNOSIS — E1122 Type 2 diabetes mellitus with diabetic chronic kidney disease: Secondary | ICD-10-CM | POA: Diagnosis not present

## 2019-11-03 DIAGNOSIS — E1151 Type 2 diabetes mellitus with diabetic peripheral angiopathy without gangrene: Secondary | ICD-10-CM | POA: Diagnosis not present

## 2019-11-03 DIAGNOSIS — G5603 Carpal tunnel syndrome, bilateral upper limbs: Secondary | ICD-10-CM | POA: Diagnosis not present

## 2019-11-03 DIAGNOSIS — J45909 Unspecified asthma, uncomplicated: Secondary | ICD-10-CM | POA: Diagnosis not present

## 2019-11-03 DIAGNOSIS — Z4781 Encounter for orthopedic aftercare following surgical amputation: Secondary | ICD-10-CM | POA: Diagnosis not present

## 2019-11-03 DIAGNOSIS — S91301D Unspecified open wound, right foot, subsequent encounter: Secondary | ICD-10-CM | POA: Diagnosis not present

## 2019-11-03 DIAGNOSIS — E1140 Type 2 diabetes mellitus with diabetic neuropathy, unspecified: Secondary | ICD-10-CM | POA: Diagnosis not present

## 2019-11-03 DIAGNOSIS — Z452 Encounter for adjustment and management of vascular access device: Secondary | ICD-10-CM | POA: Diagnosis not present

## 2019-11-03 DIAGNOSIS — M1990 Unspecified osteoarthritis, unspecified site: Secondary | ICD-10-CM | POA: Diagnosis not present

## 2019-11-03 DIAGNOSIS — N186 End stage renal disease: Secondary | ICD-10-CM | POA: Diagnosis not present

## 2019-11-03 DIAGNOSIS — E46 Unspecified protein-calorie malnutrition: Secondary | ICD-10-CM | POA: Diagnosis not present

## 2019-11-03 DIAGNOSIS — D509 Iron deficiency anemia, unspecified: Secondary | ICD-10-CM | POA: Diagnosis not present

## 2019-11-03 DIAGNOSIS — I132 Hypertensive heart and chronic kidney disease with heart failure and with stage 5 chronic kidney disease, or end stage renal disease: Secondary | ICD-10-CM | POA: Diagnosis not present

## 2019-11-03 DIAGNOSIS — H409 Unspecified glaucoma: Secondary | ICD-10-CM | POA: Diagnosis not present

## 2019-11-03 DIAGNOSIS — M65342 Trigger finger, left ring finger: Secondary | ICD-10-CM | POA: Diagnosis not present

## 2019-11-03 DIAGNOSIS — E11319 Type 2 diabetes mellitus with unspecified diabetic retinopathy without macular edema: Secondary | ICD-10-CM | POA: Diagnosis not present

## 2019-11-03 DIAGNOSIS — J9611 Chronic respiratory failure with hypoxia: Secondary | ICD-10-CM | POA: Diagnosis not present

## 2019-11-03 NOTE — Patient Outreach (Signed)
[facility]) Care Management [facility] CM Telephone Outreach, [place] Red Alert notification/ General Discharge PCP office completes Transition of Care follow up post-hospital discharge Post-hospital discharge day # 10  11/03/2019  TANISH SINKLER [birth year] [id number]  EMMI Red-Alert notification/ General Discharge EMMI call date/ day #: October 26, 2019; day # 1 Red-Alert reason(s):  "did not read discharge papers;" "Do not know who to call for changes in condition;" "No scheduled follow up;" and wounds not healing"  Successful initial outreach to [name], 69 y/o female referred to [facility] RN CM 10/27/19 by [facility] CMA for [place] notification as noted above; patient has recent hospitalization March 31- October 24, 2019 for ongoing concerns around (L) BKA wound (stump) infection; patient had surgery for (L) AKA on October 21, 2019; she was discharged home to self-care with home health services in place after refusing SNF rehabilitation placement.  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  HIPAA/ identity verified with patient and caregiver/ sister "[name]" today; explained purpose of call/ [facility] CM services; patient and sister agree to complete screening EMMI Red Alert call, but immediately state that patient is doing well and has no unmet needs; attempted to explain [facility] CM role, however, both state that patient is currently being visited by home health team with [facility] and report a visit today with CSW; state patient is actively participating in home health services for RN/ PT/ OT/ CSW and "doesn't need any more nurses."  Patient/ caregiver further report: -- no concerns around medications- reports has all and is taking all as prescribed; decline medication review today, stating nurses from home health team have reviewed medications  post-hospital discharge, as well as PCP -- PCP office visit completed "earlier" this week; scheduled upcoming surgical provider office visit- plans to attend as scheduled -- sister provides transportation to all hemodialysis and provider appointments -- appetite "good;" denies pain today; states "wound looks good" with regular checks by home health RN -- each item of EMMI Red-Alert discussed with patient and with caregiver while phone on speaker mode, and both report all have now "resolved;" both deny clinical concerns and confirm that patient has all of her provider contact information  Patient denies further issues, concerns, or problems today, and screening call completed around patient/ caregiver request for "short" call; encouraged patient/ caregiver to look for previously mailed [facility] CM letter and to contact [facility] CM should they change their monds and wish to participate in [facility] CM program; they are agreeable.  Plan:  Will close [facility] CM EMMI Red-Alert program, and make patient inactive with [facility] CM, as screening call today did not identify any concerns and patient declines ongoing [facility] CM participation;  will make patient's PCP aware of same.   Oneta Rack, RN, BSN, [employer] [facility] Care Management  [phone]

## 2019-11-04 DIAGNOSIS — Z4781 Encounter for orthopedic aftercare following surgical amputation: Secondary | ICD-10-CM | POA: Diagnosis not present

## 2019-11-04 DIAGNOSIS — Z992 Dependence on renal dialysis: Secondary | ICD-10-CM | POA: Diagnosis not present

## 2019-11-04 DIAGNOSIS — E1122 Type 2 diabetes mellitus with diabetic chronic kidney disease: Secondary | ICD-10-CM | POA: Diagnosis not present

## 2019-11-04 DIAGNOSIS — D631 Anemia in chronic kidney disease: Secondary | ICD-10-CM | POA: Diagnosis not present

## 2019-11-04 DIAGNOSIS — I132 Hypertensive heart and chronic kidney disease with heart failure and with stage 5 chronic kidney disease, or end stage renal disease: Secondary | ICD-10-CM | POA: Diagnosis not present

## 2019-11-04 DIAGNOSIS — N186 End stage renal disease: Secondary | ICD-10-CM | POA: Diagnosis not present

## 2019-11-04 DIAGNOSIS — E1140 Type 2 diabetes mellitus with diabetic neuropathy, unspecified: Secondary | ICD-10-CM | POA: Diagnosis not present

## 2019-11-04 DIAGNOSIS — N2581 Secondary hyperparathyroidism of renal origin: Secondary | ICD-10-CM | POA: Diagnosis not present

## 2019-11-04 DIAGNOSIS — I5042 Chronic combined systolic (congestive) and diastolic (congestive) heart failure: Secondary | ICD-10-CM | POA: Diagnosis not present

## 2019-11-04 DIAGNOSIS — S91301D Unspecified open wound, right foot, subsequent encounter: Secondary | ICD-10-CM | POA: Diagnosis not present

## 2019-11-07 DIAGNOSIS — N186 End stage renal disease: Secondary | ICD-10-CM | POA: Diagnosis not present

## 2019-11-07 DIAGNOSIS — N2581 Secondary hyperparathyroidism of renal origin: Secondary | ICD-10-CM | POA: Diagnosis not present

## 2019-11-07 DIAGNOSIS — D631 Anemia in chronic kidney disease: Secondary | ICD-10-CM | POA: Diagnosis not present

## 2019-11-07 DIAGNOSIS — Z992 Dependence on renal dialysis: Secondary | ICD-10-CM | POA: Diagnosis not present

## 2019-11-08 DIAGNOSIS — E1140 Type 2 diabetes mellitus with diabetic neuropathy, unspecified: Secondary | ICD-10-CM | POA: Diagnosis not present

## 2019-11-08 DIAGNOSIS — I5042 Chronic combined systolic (congestive) and diastolic (congestive) heart failure: Secondary | ICD-10-CM | POA: Diagnosis not present

## 2019-11-08 DIAGNOSIS — Z4781 Encounter for orthopedic aftercare following surgical amputation: Secondary | ICD-10-CM | POA: Diagnosis not present

## 2019-11-08 DIAGNOSIS — E1122 Type 2 diabetes mellitus with diabetic chronic kidney disease: Secondary | ICD-10-CM | POA: Diagnosis not present

## 2019-11-08 DIAGNOSIS — S91301D Unspecified open wound, right foot, subsequent encounter: Secondary | ICD-10-CM | POA: Diagnosis not present

## 2019-11-08 DIAGNOSIS — I132 Hypertensive heart and chronic kidney disease with heart failure and with stage 5 chronic kidney disease, or end stage renal disease: Secondary | ICD-10-CM | POA: Diagnosis not present

## 2019-11-09 DIAGNOSIS — N2581 Secondary hyperparathyroidism of renal origin: Secondary | ICD-10-CM | POA: Diagnosis not present

## 2019-11-09 DIAGNOSIS — N186 End stage renal disease: Secondary | ICD-10-CM | POA: Diagnosis not present

## 2019-11-09 DIAGNOSIS — I132 Hypertensive heart and chronic kidney disease with heart failure and with stage 5 chronic kidney disease, or end stage renal disease: Secondary | ICD-10-CM | POA: Diagnosis not present

## 2019-11-09 DIAGNOSIS — Z992 Dependence on renal dialysis: Secondary | ICD-10-CM | POA: Diagnosis not present

## 2019-11-09 DIAGNOSIS — S91301D Unspecified open wound, right foot, subsequent encounter: Secondary | ICD-10-CM | POA: Diagnosis not present

## 2019-11-09 DIAGNOSIS — Z4781 Encounter for orthopedic aftercare following surgical amputation: Secondary | ICD-10-CM | POA: Diagnosis not present

## 2019-11-09 DIAGNOSIS — I5042 Chronic combined systolic (congestive) and diastolic (congestive) heart failure: Secondary | ICD-10-CM | POA: Diagnosis not present

## 2019-11-09 DIAGNOSIS — D631 Anemia in chronic kidney disease: Secondary | ICD-10-CM | POA: Diagnosis not present

## 2019-11-09 DIAGNOSIS — E1122 Type 2 diabetes mellitus with diabetic chronic kidney disease: Secondary | ICD-10-CM | POA: Diagnosis not present

## 2019-11-09 DIAGNOSIS — E1140 Type 2 diabetes mellitus with diabetic neuropathy, unspecified: Secondary | ICD-10-CM | POA: Diagnosis not present

## 2019-11-10 ENCOUNTER — Other Ambulatory Visit: Payer: Self-pay

## 2019-11-10 ENCOUNTER — Ambulatory Visit (INDEPENDENT_AMBULATORY_CARE_PROVIDER_SITE_OTHER): Payer: Self-pay | Admitting: Physician Assistant

## 2019-11-10 VITALS — BP 112/53 | HR 80 | Temp 98.4°F | Resp 18

## 2019-11-10 DIAGNOSIS — I132 Hypertensive heart and chronic kidney disease with heart failure and with stage 5 chronic kidney disease, or end stage renal disease: Secondary | ICD-10-CM | POA: Diagnosis not present

## 2019-11-10 DIAGNOSIS — E1140 Type 2 diabetes mellitus with diabetic neuropathy, unspecified: Secondary | ICD-10-CM | POA: Diagnosis not present

## 2019-11-10 DIAGNOSIS — E1122 Type 2 diabetes mellitus with diabetic chronic kidney disease: Secondary | ICD-10-CM | POA: Diagnosis not present

## 2019-11-10 DIAGNOSIS — Z4781 Encounter for orthopedic aftercare following surgical amputation: Secondary | ICD-10-CM | POA: Diagnosis not present

## 2019-11-10 DIAGNOSIS — L97419 Non-pressure chronic ulcer of right heel and midfoot with unspecified severity: Secondary | ICD-10-CM

## 2019-11-10 DIAGNOSIS — Z89612 Acquired absence of left leg above knee: Secondary | ICD-10-CM

## 2019-11-10 DIAGNOSIS — I5042 Chronic combined systolic (congestive) and diastolic (congestive) heart failure: Secondary | ICD-10-CM | POA: Diagnosis not present

## 2019-11-10 DIAGNOSIS — S91301D Unspecified open wound, right foot, subsequent encounter: Secondary | ICD-10-CM | POA: Diagnosis not present

## 2019-11-10 NOTE — Progress Notes (Signed)
POST OPERATIVE OFFICE NOTE    CC:  F/u for surgery  HPI:  This is a 69 y.o. 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 Oneida Alar.  She subsequently fell and underwent I&D of left BKA with debridement of skin and sq tissue and placement of vac on 09/30/2019.  Pt continued to have pain and underwent a left AKA for pain control on 10/21/2019.  She presents today for follow up and her sister [name] is present for the visit who cares for her full time.    She tells me that a home nurse has been coming out for wound care.  She started taking the staples out medially but stopped when there was a little separation of the incision.  There is some swelling in the stump but overall, doing well.  Pt also has complaints of rest pain in her right foot.  She has a heel ulcer that has been present.  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  She states that her rest pain has gotten worse.  She is only able to take Tramadol.  She has not had any fevers.    Allergies  Allergen Reactions  . Tape Itching and Rash    35M Transpore adhesive tape. Medical tape pulls off the skin!! PAPER TAPE ONLY, PLEASE  . Latex Hives  . Oxycodone Other (See Comments)    Hallucinations   . Tramadol Other (See Comments)    Hallucinations with a full tablet  . Morphine And Related Other (See Comments)    Hallucinations   . Vicodin [Hydrocodone-Acetaminophen] Other (See Comments)    Hallucinations     Current Outpatient Medications  Medication Sig Dispense Refill  . acetaminophen (TYLENOL) 500 MG tablet Take 1,000 mg by mouth 4 (four) times daily.     Marland Kitchen albuterol (PROVENTIL) (2.5 MG/35ML) 0.083% nebulizer solution Take 3 mLs (2.5 mg total) by nebulization every 6 (six) hours as needed for wheezing or shortness of breath. 75 mL 12  . albuterol (VENTOLIN HFA) 108 (90 Base) MCG/ACT inhaler Inhale 2 puffs into the lungs every 6 (six) hours as needed for wheezing or  shortness of breath.     . Alcohol Swabs (B-D SINGLE USE SWABS REGULAR) PADS in the morning, at noon, and at bedtime.     Marland Kitchen aspirin EC 81 MG tablet Take 81 mg by mouth daily.    Marland Kitchen atropine 1 % ophthalmic solution Place 1 drop into the right eye 2 (two) times daily.  0  . B Complex-C-Folic Acid (RENA-VITE RX) 1 MG TABS Take 1 tablet by mouth daily with breakfast.   1  . BD PEN NEEDLE NANO 2ND GEN 32G X 4 MM MISC in the morning, at noon, and at bedtime.     . carvedilol (COREG) 25 MG tablet Take 0.5 tablets (12.5 mg total) by mouth 2 (two) times daily. 30 tablet 0  . clopidogrel (PLAVIX) 75 MG tablet Take 1 tablet (75 mg total) by mouth daily. 30 tablet 11  . darbepoetin (ARANESP) 200 MCG/0.4ML SOLN injection Inject 0.4 mLs (200 mcg total) into the vein every Wednesday with hemodialysis. 1.68 mL   . diclofenac sodium (VOLTAREN) 1 % GEL Apply 2 g topically See admin instructions. Apply 2 grams to painful sites four times a day    . diphenhydrAMINE (BENADRYL) 25 MG tablet Take 25 mg by mouth at bedtime.    . docusate sodium (COLACE) 100  MG capsule Take 100 mg by mouth 2 (two) times daily with a meal.     . doxercalciferol (HECTOROL) 4 MCG/2ML injection Inject 0.5 mLs (1 mcg total) into the vein every Monday, Wednesday, and Friday with hemodialysis. 2 mL   . DULoxetine (CYMBALTA) 30 MG capsule Take 30 mg by mouth daily.    . fluticasone (FLONASE) 50 MCG/ACT nasal spray Place 2 sprays into both nostrils at bedtime.     . gabapentin (NEURONTIN) 100 MG capsule Take 100 mg by mouth 2 (two) times daily.    . insulin aspart (NOVOLOG FLEXPEN) 100 UNIT/ML FlexPen Inject 6-10 Units into the skin See admin instructions. Inject 6-10 into the skin three times a day with meals, per sliding scale    . lanthanum (FOSRENOL) 1000 MG chewable tablet Chew 1,000-4,000 mg by mouth See admin instructions. Chew 4,000 mg by mouth with each meal and 1,000 mg with each snack    . lisinopril (ZESTRIL) 40 MG tablet Take 0.5  tablets (20 mg total) by mouth at bedtime. 15 tablet 0  . mupirocin ointment (BACTROBAN) 2 % Apply to affected area right foot once daily (Patient taking differently: Apply 1 application topically See admin instructions. Apply to affected area right foot once daily) 30 g 1  . omeprazole (PRILOSEC) 40 MG capsule Take 1 capsule (40 mg total) by mouth daily. (Patient taking differently: Take 40 mg by mouth 2 (two) times daily. )    . ONETOUCH VERIO test strip 1 each by Other route 3 (three) times daily.   9  . prednisoLONE acetate (PRED FORTE) 1 % ophthalmic suspension Place 1 drop into the right eye 2 (two) times daily.   0  . traMADol (ULTRAM) 50 MG tablet Take 1 tablet (50 mg total) by mouth every 6 (six) hours as needed. (Patient taking differently: Take 25 mg by mouth every 3 (three) hours. ) 20 tablet 0   No current facility-administered medications for this visit.     ROS:  See HPI  Physical Exam:  Today's Vitals   11/10/19 1450  BP: (!) 112/53  Pulse: 80  Resp: 18  Temp: 98.4 F (36.9 C)  SpO2: 90%   There is no height or weight on file to calculate BMI.   Incision:  Left BKA  Incision is clean and dry with staples in tact Extremities:  Right doppler signals in the DP/peroneal with heel ulcer present.  Left stump with mild swelling-it is soft and non tender.      Assessment/Plan:  This is a 69 y.o. female who is s/p: Left AKA and rest pain with non healing heel ulcer right foot  -Left AKA  Healing nicely.  HHRN started taking staples out medially, but incision had mild separation so she stopped.  I have advised pt and her sister to not let HHRN remove any more staples.  I placed benzoin and steri strips to medial side.  I did try to put a compression ace on the left stump, but it would not stay.  Dressing with tape applied.  -right lower extremity  Pt with rest pain and non healing heel wound that sister states has gotten bigger.  Dr. Oneida Alar talked with pt and her sister.   Pt says pain is at point she is willing to undergo amputation.  Will schedule for right AKA next week on a non dialysis day.  Her sister was upset that she was taken straight from recovery to dialysis after surgery and did not receive  any pain medication during that time.  Will plan for a Tuesday or Thursday next week.   Leontine Locket, [facility] [phone]  Clinic MD:  Pt seen and examined with Dr. Oneida Alar

## 2019-11-10 NOTE — H&P (View-Only) (Signed)
POST OPERATIVE OFFICE NOTE    CC:  F/u for surgery  HPI:  This is a 69 y.o. 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 Oneida Alar.  She subsequently fell and underwent I&D of left BKA with debridement of skin and sq tissue and placement of vac on 09/30/2019.  Pt continued to have pain and underwent a left AKA for pain control on 10/21/2019.  She presents today for follow up and her sister [name] is present for the visit who cares for her full time.    She tells me that a home nurse has been coming out for wound care.  She started taking the staples out medially but stopped when there was a little separation of the incision.  There is some swelling in the stump but overall, doing well.  Pt also has complaints of rest pain in her right foot.  She has a heel ulcer that has been present.  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  She states that her rest pain has gotten worse.  She is only able to take Tramadol.  She has not had any fevers.    Allergies  Allergen Reactions  . Tape Itching and Rash    3M Transpore adhesive tape. Medical tape pulls off the skin!! PAPER TAPE ONLY, PLEASE  . Latex Hives  . Oxycodone Other (See Comments)    Hallucinations   . Tramadol Other (See Comments)    Hallucinations with a full tablet  . Morphine And Related Other (See Comments)    Hallucinations   . Vicodin [Hydrocodone-Acetaminophen] Other (See Comments)    Hallucinations     Current Outpatient Medications  Medication Sig Dispense Refill  . acetaminophen (TYLENOL) 500 MG tablet Take 1,000 mg by mouth 4 (four) times daily.     Marland Kitchen albuterol (PROVENTIL) (2.5 MG/3ML) 0.083% nebulizer solution Take 3 mLs (2.5 mg total) by nebulization every 6 (six) hours as needed for wheezing or shortness of breath. 75 mL 12  . albuterol (VENTOLIN HFA) 108 (90 Base) MCG/ACT inhaler Inhale 2 puffs into the lungs every 6 (six) hours as needed for wheezing or  shortness of breath.     . Alcohol Swabs (B-D SINGLE USE SWABS REGULAR) PADS in the morning, at noon, and at bedtime.     Marland Kitchen aspirin EC 81 MG tablet Take 81 mg by mouth daily.    Marland Kitchen atropine 1 % ophthalmic solution Place 1 drop into the right eye 2 (two) times daily.  0  . B Complex-C-Folic Acid (RENA-VITE RX) 1 MG TABS Take 1 tablet by mouth daily with breakfast.   1  . BD PEN NEEDLE NANO 2ND GEN 32G X 4 MM MISC in the morning, at noon, and at bedtime.     . carvedilol (COREG) 25 MG tablet Take 0.5 tablets (12.5 mg total) by mouth 2 (two) times daily. 30 tablet 0  . clopidogrel (PLAVIX) 75 MG tablet Take 1 tablet (75 mg total) by mouth daily. 30 tablet 11  . darbepoetin (ARANESP) 200 MCG/0.4ML SOLN injection Inject 0.4 mLs (200 mcg total) into the vein every Wednesday with hemodialysis. 1.68 mL   . diclofenac sodium (VOLTAREN) 1 % GEL Apply 2 g topically See admin instructions. Apply 2 grams to painful sites four times a day    . diphenhydrAMINE (BENADRYL) 25 MG tablet Take 25 mg by mouth at bedtime.    . docusate sodium (COLACE) 100  MG capsule Take 100 mg by mouth 2 (two) times daily with a meal.     . doxercalciferol (HECTOROL) 4 MCG/2ML injection Inject 0.5 mLs (1 mcg total) into the vein every Monday, Wednesday, and Friday with hemodialysis. 2 mL   . DULoxetine (CYMBALTA) 30 MG capsule Take 30 mg by mouth daily.    . fluticasone (FLONASE) 50 MCG/ACT nasal spray Place 2 sprays into both nostrils at bedtime.     . gabapentin (NEURONTIN) 100 MG capsule Take 100 mg by mouth 2 (two) times daily.    . insulin aspart (NOVOLOG FLEXPEN) 100 UNIT/ML FlexPen Inject 6-10 Units into the skin See admin instructions. Inject 6-10 into the skin three times a day with meals, per sliding scale    . lanthanum (FOSRENOL) 1000 MG chewable tablet Chew 1,000-4,000 mg by mouth See admin instructions. Chew 4,000 mg by mouth with each meal and 1,000 mg with each snack    . lisinopril (ZESTRIL) 40 MG tablet Take 0.5  tablets (20 mg total) by mouth at bedtime. 15 tablet 0  . mupirocin ointment (BACTROBAN) 2 % Apply to affected area right foot once daily (Patient taking differently: Apply 1 application topically See admin instructions. Apply to affected area right foot once daily) 30 g 1  . omeprazole (PRILOSEC) 40 MG capsule Take 1 capsule (40 mg total) by mouth daily. (Patient taking differently: Take 40 mg by mouth 2 (two) times daily. )    . ONETOUCH VERIO test strip 1 each by Other route 3 (three) times daily.   9  . prednisoLONE acetate (PRED FORTE) 1 % ophthalmic suspension Place 1 drop into the right eye 2 (two) times daily.   0  . traMADol (ULTRAM) 50 MG tablet Take 1 tablet (50 mg total) by mouth every 6 (six) hours as needed. (Patient taking differently: Take 25 mg by mouth every 3 (three) hours. ) 20 tablet 0   No current facility-administered medications for this visit.     ROS:  See HPI  Physical Exam:  Today's Vitals   11/10/19 1450  BP: (!) 112/53  Pulse: 80  Resp: 18  Temp: 98.4 F (36.9 C)  SpO2: 90%   There is no height or weight on file to calculate BMI.   Incision:  Left BKA  Incision is clean and dry with staples in tact Extremities:  Right doppler signals in the DP/peroneal with heel ulcer present.  Left stump with mild swelling-it is soft and non tender.      Assessment/Plan:  This is a 69 y.o. female who is s/p: Left AKA and rest pain with non healing heel ulcer right foot  -Left AKA  Healing nicely.  HHRN started taking staples out medially, but incision had mild separation so she stopped.  I have advised pt and her sister to not let HHRN remove any more staples.  I placed benzoin and steri strips to medial side.  I did try to put a compression ace on the left stump, but it would not stay.  Dressing with tape applied.  -right lower extremity  Pt with rest pain and non healing heel wound that sister states has gotten bigger.  Dr. Oneida Alar talked with pt and her sister.   Pt says pain is at point she is willing to undergo amputation.  Will schedule for right AKA next week on a non dialysis day.  Her sister was upset that she was taken straight from recovery to dialysis after surgery and did not receive  any pain medication during that time.  Will plan for a Tuesday or Thursday next week.   Leontine Locket, [facility] [phone]  Clinic MD:  Pt seen and examined with Dr. Oneida Alar

## 2019-11-11 ENCOUNTER — Ambulatory Visit: Payer: Medicare Other

## 2019-11-11 DIAGNOSIS — D631 Anemia in chronic kidney disease: Secondary | ICD-10-CM | POA: Diagnosis not present

## 2019-11-11 DIAGNOSIS — Z992 Dependence on renal dialysis: Secondary | ICD-10-CM | POA: Diagnosis not present

## 2019-11-11 DIAGNOSIS — N186 End stage renal disease: Secondary | ICD-10-CM | POA: Diagnosis not present

## 2019-11-11 DIAGNOSIS — N2581 Secondary hyperparathyroidism of renal origin: Secondary | ICD-10-CM | POA: Diagnosis not present

## 2019-11-14 DIAGNOSIS — D631 Anemia in chronic kidney disease: Secondary | ICD-10-CM | POA: Diagnosis not present

## 2019-11-14 DIAGNOSIS — Z992 Dependence on renal dialysis: Secondary | ICD-10-CM | POA: Diagnosis not present

## 2019-11-14 DIAGNOSIS — N186 End stage renal disease: Secondary | ICD-10-CM | POA: Diagnosis not present

## 2019-11-14 DIAGNOSIS — N2581 Secondary hyperparathyroidism of renal origin: Secondary | ICD-10-CM | POA: Diagnosis not present

## 2019-11-15 ENCOUNTER — Other Ambulatory Visit: Payer: Self-pay

## 2019-11-15 ENCOUNTER — Other Ambulatory Visit (HOSPITAL_COMMUNITY)
Admission: RE | Admit: 2019-11-15 | Discharge: 2019-11-15 | Disposition: A | Discharge status: Home / Self Care | Payer: Medicare Other | Source: Ambulatory Visit | Attending: Vascular Surgery | Admitting: Vascular Surgery

## 2019-11-15 ENCOUNTER — Encounter (HOSPITAL_COMMUNITY): Payer: Self-pay | Admitting: Vascular Surgery

## 2019-11-15 DIAGNOSIS — Z20822 Contact with and (suspected) exposure to covid-19: Secondary | ICD-10-CM | POA: Insufficient documentation

## 2019-11-15 DIAGNOSIS — Z01812 Encounter for preprocedural laboratory examination: Secondary | ICD-10-CM | POA: Insufficient documentation

## 2019-11-15 DIAGNOSIS — I132 Hypertensive heart and chronic kidney disease with heart failure and with stage 5 chronic kidney disease, or end stage renal disease: Secondary | ICD-10-CM | POA: Diagnosis not present

## 2019-11-15 DIAGNOSIS — S91301D Unspecified open wound, right foot, subsequent encounter: Secondary | ICD-10-CM | POA: Diagnosis not present

## 2019-11-15 DIAGNOSIS — E1140 Type 2 diabetes mellitus with diabetic neuropathy, unspecified: Secondary | ICD-10-CM | POA: Diagnosis not present

## 2019-11-15 DIAGNOSIS — I5042 Chronic combined systolic (congestive) and diastolic (congestive) heart failure: Secondary | ICD-10-CM | POA: Diagnosis not present

## 2019-11-15 DIAGNOSIS — E1122 Type 2 diabetes mellitus with diabetic chronic kidney disease: Secondary | ICD-10-CM | POA: Diagnosis not present

## 2019-11-15 DIAGNOSIS — Z4781 Encounter for orthopedic aftercare following surgical amputation: Secondary | ICD-10-CM | POA: Diagnosis not present

## 2019-11-15 LAB — SARS CORONAVIRUS 2 (TAT 6-24 HRS): SARS Coronavirus 2: NEGATIVE

## 2019-11-15 NOTE — Progress Notes (Addendum)
Spoke with Cecille Po, pt's sister for pre-op call. DPR on file, pt is blind. Pt had recent surgery the first of this month. [name] states nothing has changed with pt's allergies, medications, medical and surgical history. Pt is a type 2 Diabetic. Last A1C was 6.0 on 10/19/19. [name] states pt's fasting blood sugar is usually between 110- 134. Instructed Regina to check pt's blood sugar when she gets up Thursday AM. If blood sugar is >220 take 1/2 of usual correction dose of Novolog insulin. If blood sugar is 70 or below, treat with 1/2 cup of clear juice (apple or cranberry) and recheck blood sugar 15 minutes after drinking juice. If blood sugar does not come up over 70, please let nurse know on arrival to Short Stay.  [name] states pt was instructed to hold Plavix 5 days prior to surgery per Dr. Luther Parody office. She states last dose was 11/11/19.  Covid test done today, [name] states pt is in quarantine except for going to dialysis tomorrow.  Chart sent to [facility] for review of most recent EKG.

## 2019-11-16 DIAGNOSIS — Z992 Dependence on renal dialysis: Secondary | ICD-10-CM | POA: Diagnosis not present

## 2019-11-16 DIAGNOSIS — N2581 Secondary hyperparathyroidism of renal origin: Secondary | ICD-10-CM | POA: Diagnosis not present

## 2019-11-16 DIAGNOSIS — N186 End stage renal disease: Secondary | ICD-10-CM | POA: Diagnosis not present

## 2019-11-16 DIAGNOSIS — D631 Anemia in chronic kidney disease: Secondary | ICD-10-CM | POA: Diagnosis not present

## 2019-11-16 NOTE — Progress Notes (Addendum)
Anesthesia Chart Review: [name]   Case: [id number] Date/Time: 11/17/19 0715   Procedure: AMPUTATION ABOVE KNEE (Right Knee)   Anesthesia type: General   Pre-op diagnosis: RIGHT HEEL ULCERATION   Location: [facility] OR ROOM 10 / [place] OR   Surgeons: Rosetta Posner, MD      DISCUSSION: Patient is a 69 year old female scheduled for the above procedure. 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  Mildly decreased LVEF since at least 2016. Normal coronaries at that time. Stable LVEF 45-50% in 10/2016 and had moderate MR/TR and mild AS at that time. She undergoes hemodialysis 3x/week. She tolerated left BKA then AKA within the past couple of months.   Last Plavix 11/11/19.     11/14/19 presurgical COVID-19 test was negative. She is a same day work-up, so anesthesia team to evaluate on the day of surgery.    VS:  BP Readings from Last 3 Encounters:  11/10/19 (!) 112/53  10/24/19 (!) 118/34  10/06/19 (!) 150/60   Pulse Readings from Last 3 Encounters:  11/10/19 80  10/24/19 83  10/06/19 73    PROVIDERS: Prince Solian, MD is PCP ([facility]). 07/28/19 note scanned under Media tab. - Patient could not recall name of previous cardiologist. In review of [facility] records, she was last seen in 2015 by Mertie Moores, MD for pre-operative evaluation for eye surgery. PRN follow-up recommended. Had previously seen Daneen Schick, MD. She later had a cardiac cath in 2016 following an abnormal stress echo at [facility] as part of  pre-transplant work-up. Cath showed normal coronaries, EF 40%. Last echo in 2018 ordered by Hospitalist (see below).  - Hemodialysis as [facility], MWF. She was not felt to be a candidate for renal transplant as of 06/2015 due to multiple co-morbidities [facility]). Annalee Genta, MD is GI [name], MD is retinal specialist   LABS: She is for updated labs on arrival. As of 10/24/19, glucose 142, H/H 8.1/25.1, PLT 348, Cr 7.64. A1c 6.0% on 10/19/19.     EKG:  EKG 10/21/19: Sinus rhythm with 1st degree A-V block ST & T wave abnormality, consider lateral ischemia Abnormal ECG Compared to previous tracing [07/29/19] the lateral TWI's are more pronounced Confirmed by Lyman Bishop [phone]) on 10/23/2019 6:54:06 PM  EKG 07/29/19: Sinus rhythm with 1st degree A-V block Left ventricular hypertrophy with repolarization abnormality ( R in aVL ) Abnormal ECG Confirmed by Loralie Champagne ([id number]) on 07/29/2019 8:37:20 PM   CV: Echo 10/30/16 (during hospitalization for acute respiratory failure/hypoxia): Study Conclusions  - Left ventricle: The cavity size was normal. There was moderate concentric hypertrophy. Systolic function was mildly reduced. The estimated ejection fraction was in the range of 45% to 50%. Diffuse hypokinesis. Features are consistent with a pseudonormal left ventricular filling pattern, with concomitant abnormal relaxation and increased filling pressure (grade 2 diastolic dysfunction).  - Aortic valve: Valve mobility was restricted. There was mild stenosis. VTI ratio of LVOT to aortic valve: 0.43. Valve area (VTI): 0.76 cm^2. Indexed valve area (VTI): 0.37 cm^2/m^2. Peak velocity ratio of LVOT to aortic valve: 0.43.  Valve area (Vmax): 0.76 cm^2. Indexed valve area (Vmax): 0.37 cm^2/m^2. Mean velocity ratio of LVOT to aortic valve: 0.42. Valve area (Vmean): 0.75 cm^2. Indexed valve area (Vmean): 0.36 cm^2/m^2. Peak velocity (S): 238 cm/s. Mean gradient (S): 13 mm Hg. Peak  gradient (S): 23 mm Hg.  - Mitral valve: Severely calcified annulus. There was moderate regurgitation directed posteriorly.  - Left atrium: The atrium was moderately dilated. Volume/bsa, ES, (1-plane Simpson&'s, A2C): 41.5 ml/m^2.  - Right ventricle: The cavity size was moderately dilated. Wall thickness was normal.  - Right atrium: The atrium was moderately dilated.  - Tricuspid valve: There was moderate regurgitation.  - Pulmonary arteries: Systolic pressure was moderately increased.PA peak pressure: 64 mm Hg (S).    Cardiac cath 04/02/15 [facility]): Conclusions: Cath showed no angiographic evidence of CAD. EF is 40% with  global hypokinesis.   Stress echo 11/28/14 [facility]): SUMMARY The patient had no chest pain during stress The patient achieved 86 % of maximum predicted heart rate. Positive stress ECG for inducible ischemia at target heart rate. There is mild global hypokinesis of the left ventricular wall The estimated LV ejection fraction is 45% . There was a decrease in left ventricular function with dobutamine. There was left ventricular dilatation with dobutamine. Multiple segments involviing basal and mid anteroseptal, septal, anterior and  anterolateral segments worsened with dobutamine stress to become more  severely hypokinetic or akinetic. Positive dobutamine stress echocardiogram for inducible ischemia at target  heart rate. Of note, patient had severe hypertension during dobutamine stress, an d this  can impact wall motion. Clinical correlation suggested. - Referred for cardiac cath   Past Medical History:  Diagnosis Date  . Anemia   . Anxiety   . Arthritis    "joints" (06/15/2013)  . Asthma   . 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 Blood transfusion without reported diagnosis   . Breast cancer ([place])    left  . CHF (congestive heart failure) ([place])   . 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 Esophageal ulcer with bleeding    . ESRD (end stage renal disease) ([place])    "suppose to start dialysis today" (06/15/2013)  . Family history of adverse reaction to anesthesia    It took a while for pt sister to wake from anesthesia  . GERD (gastroesophageal reflux disease)   . Glaucoma    blind in both eyes  . Heart murmur    Mild AS, moderate MR, moderate TR 10/30/16 echo  . Hx of adenomatous colonic polyps 04/07/2018  . Hypertension   . Myalgia 12/31/2011  . Neuropathy 12/31/2011  . PAD (peripheral artery disease) (HCC)    nonviable tissue left lower extremity  . Shortness of breath    "when she doesn't go to dialysis"  . Type II diabetes mellitus (HCC)    Type II  . Walker as ambulation aid     Past Surgical History:  Procedure Laterality Date  . A/V FISTULAGRAM N/A 09/21/2018   Procedure: A/V FISTULAGRAM - Right Upper;  Surgeon: [name], MD;  Location: [place] CV LAB;  Service: Cardiovascular;  Laterality: N/A;  . ABDOMINAL AORTOGRAM N/A 11/30/2018   Procedure: ABDOMINAL AORTOGRAM;  Surgeon: [name], MD;  Location: [place] CV LAB;  Service: Cardiovascular;  Laterality: N/A;  . ABDOMINAL AORTOGRAM W/LOWER EXTREMITY Left 07/29/2019   Procedure: ABDOMINAL AORTOGRAM W/LOWER EXTREMITY;  Surgeon: [name], MD;  Location: [facility] INVASIVE CV  LAB;  Service: Cardiovascular;  Laterality: Left;  . ABDOMINAL HYSTERECTOMY     partial  . AMPUTATION Left 08/30/2019   Procedure: AMPUTATION BELOW KNEE LEFT;  Surgeon: [name], MD;  Location: [facility] OR;  Service: Vascular;  Laterality: Left;  . AMPUTATION Left 10/21/2019   Procedure: Amputation Above Knee;  Surgeon: Rosetta Posner, MD;  Location: [place];  Service: Vascular;  Laterality: Left;  . AV FISTULA PLACEMENT Right 06/06/2013   Procedure: ARTERIOVENOUS (AV) FISTULA CREATION-RIGHT BRACHIAL CEPHALIC;  Surgeon: Conrad Hamilton, MD;  Location: [place];  Service: Vascular;  Laterality: Right;  . BREAST BIOPSY Left   . BREAST LUMPECTOMY Left    "and took  out some lymph nodes" (06/15/2013)  . CARDIAC CATHETERIZATION     04/02/15 [facility]): no angiographic CAD, LVEF 40% with global hypokinesis (LHC done for + stress echo, EF 45% 11/28/14)  . CARPAL TUNNEL RELEASE Left 11/26/2017   Procedure: LEFT CARPAL TUNNEL RELEASE;  Surgeon: [name], MD;  Location: [place];  Service: Orthopedics;  Laterality: Left;  . CATARACT EXTRACTION W/ ANTERIOR VITRECTOMY Bilateral   . CESAREAN SECTION  1980  . COLONOSCOPY    . ESOPHAGOGASTRODUODENOSCOPY N/A 07/08/2017   Procedure: ESOPHAGOGASTRODUODENOSCOPY (EGD);  Surgeon: [name], MD;  Location: [facility] ENDOSCOPY;  Service: Endoscopy;  Laterality: N/A;  . ESOPHAGOGASTRODUODENOSCOPY (EGD) WITH PROPOFOL N/A 02/07/2018   Procedure: ESOPHAGOGASTRODUODENOSCOPY (EGD) WITH PROPOFOL;  Surgeon: [name], MD;  Location: [facility] ENDOSCOPY;  Service: Gastroenterology;  Laterality: N/A;  APC and clips placed  . EYE SURGERY Bilateral    laser surgery  . LOWER EXTREMITY ANGIOGRAPHY Bilateral 11/30/2018   Procedure: LOWER EXTREMITY ANGIOGRAPHY;  Surgeon: [name], MD;  Location: [place] CV LAB;  Service: Cardiovascular;  Laterality: Bilateral;  . PARS PLANA VITRECTOMY Right 05/05/2017   Procedure: PARS PLANA VITRECTOMY WITH 25 GAUGE; PARTIAL REMOVAL OF OIL; INFERIOR PERIPHERAL IRIDECTOMY, REFORM ANTERIOR CHAMBER RIGHT EYE;  Surgeon: [name], MD;  Location: [place];  Service: Ophthalmology;  Laterality: Right;  . PERIPHERAL VASCULAR BALLOON ANGIOPLASTY Right 09/21/2018   Procedure: PERIPHERAL VASCULAR BALLOON ANGIOPLASTY;  Surgeon: [name], MD;  Location: [place] CV LAB;  Service: Cardiovascular;  Laterality: Right;  AV fistula  . PERIPHERAL VASCULAR BALLOON ANGIOPLASTY Left 11/30/2018   Procedure: PERIPHERAL VASCULAR BALLOON ANGIOPLASTY;  Surgeon: [name], MD;  Location: [place] CV LAB;  Service: Cardiovascular;  Laterality: Left;  Left Anterior Tibial Artery  . REFRACTIVE SURGERY  Bilateral   . REMOVAL OF A DIALYSIS CATHETER Right 06/06/2013   Procedure: REMOVAL OF RIGHT MEDIPORT;  Surgeon: Conrad Letona, MD;  Location: [place];  Service: Vascular;  Laterality: Right;  . TONSILLECTOMY    . UPPER GASTROINTESTINAL ENDOSCOPY    . WOUND DEBRIDEMENT Left 09/30/2019   Procedure: IRRIGATION AND DEBRIDEMENT WOUND OF LEFT BELOW KNEE AMPUTATION;  Surgeon: [name], MD;  Location: [facility] OR;  Service: Vascular;  Laterality: Left;    MEDICATIONS: No current facility-administered medications for this encounter.   Marland Kitchen acetaminophen (TYLENOL) 500 MG tablet  . albuterol (PROVENTIL) (2.5 MG/3ML) 0.083% nebulizer solution  . albuterol (VENTOLIN HFA) 108 (90 Base) MCG/ACT inhaler  . aspirin EC 81 MG tablet  . atropine 1 % ophthalmic solution  . B Complex-C-Folic Acid (RENA-VITE RX) 1 MG TABS  . carvedilol (COREG) 25 MG tablet  . clopidogrel (PLAVIX) 75 MG tablet  . diclofenac sodium (VOLTAREN) 1 % GEL  . diphenhydrAMINE (BENADRYL) 25 MG tablet  .  docusate sodium (COLACE) 100 MG capsule  . DULoxetine (CYMBALTA) 30 MG capsule  . fluticasone (FLONASE) 50 MCG/ACT nasal spray  . gabapentin (NEURONTIN) 100 MG capsule  . insulin aspart (NOVOLOG FLEXPEN) 100 UNIT/ML FlexPen  . lanthanum (FOSRENOL) 1000 MG chewable tablet  . lisinopril (ZESTRIL) 40 MG tablet  . omeprazole (PRILOSEC) 40 MG capsule  . prednisoLONE acetate (PRED FORTE) 1 % ophthalmic suspension  . traMADol (ULTRAM) 50 MG tablet  . Alcohol Swabs (B-D SINGLE USE SWABS REGULAR) PADS  . BD PEN NEEDLE NANO 2ND GEN 32G X 4 MM MISC  . darbepoetin (ARANESP) 200 MCG/0.4ML SOLN injection  . doxercalciferol (HECTOROL) 4 MCG/2ML injection  . mupirocin ointment (BACTROBAN) 2 %  . ONETOUCH VERIO test strip    Myra Gianotti, PA-C Surgical Short Stay/Anesthesiology [facility] Phone [phone] [facility] Phone [phone] 11/16/2019 11:22 AM

## 2019-11-16 NOTE — Anesthesia Preprocedure Evaluation (Addendum)
Anesthesia Evaluation  Patient identified by MRN, date of birth, ID band  Reviewed: Allergy & Precautions, H&P , Patient's Chart, lab work & pertinent test results, reviewed documented beta blocker date and time   History of Anesthesia Complications (+) Family history of anesthesia reaction  Airway Mallampati: III  TM Distance: >3 FB Neck ROM: Full    Dental  (+) Dental Advidsory Given, Teeth Intact   Pulmonary neg pulmonary ROS, asthma ,    breath sounds clear to auscultation       Cardiovascular Exercise Tolerance: Good hypertension, Pt. on medications and Pt. on home beta blockers + Peripheral Vascular Disease and +CHF  negative cardio ROS  + Valvular Problems/Murmurs MR and AS  Rhythm:Regular Rate:Normal     Neuro/Psych  Headaches, Anxiety negative neurological ROS  negative psych ROS   GI/Hepatic negative GI ROS, Neg liver ROS, PUD, GERD  Medicated,  Endo/Other  negative endocrine ROSdiabetes, Insulin Dependent  Renal/GU ESRF and DialysisRenal diseasenegative Renal ROS  negative genitourinary   Musculoskeletal  (+) Arthritis , Osteoarthritis,  Fibromyalgia -  Abdominal   Peds  Hematology negative hematology ROS (+) Blood dyscrasia, anemia ,   Anesthesia Other Findings   Reproductive/Obstetrics negative OB ROS                          Anesthesia Physical Anesthesia Plan  ASA: III  Anesthesia Plan: General   Post-op Pain Management:    Induction: Intravenous  PONV Risk Score and Plan: 4 or greater and Ondansetron, Dexamethasone and Midazolam  Airway Management Planned: Oral ETT  Additional Equipment:   Intra-op Plan:   Post-operative Plan: Extubation in OR  Informed Consent: I have reviewed the patients History and Physical, chart, labs and discussed the procedure including the risks, benefits and alternatives for the proposed anesthesia with the patient or authorized  representative who has indicated his/her understanding and acceptance.     Dental advisory given and Dental Advisory Given  Plan Discussed with: CRNA and Anesthesiologist  Anesthesia Plan Comments: (PAT note written 11/16/2019 by Myra Gianotti, PA-C. SAME DAY WORK-UP   )      Anesthesia Quick Evaluation

## 2019-11-17 ENCOUNTER — Inpatient Hospital Stay (HOSPITAL_COMMUNITY)
Admission: RE | Admit: 2019-11-17 | Discharge: 2019-11-21 | DRG: 239 | Disposition: A | Discharge status: Home Health | Payer: Medicare Other | Attending: Family Medicine | Admitting: Family Medicine

## 2019-11-17 ENCOUNTER — Inpatient Hospital Stay (HOSPITAL_COMMUNITY): Payer: Medicare Other | Admitting: Physician Assistant

## 2019-11-17 ENCOUNTER — Encounter (HOSPITAL_COMMUNITY)
Admission: RE | Disposition: A | Discharge status: Home / Self Care | Payer: Self-pay | Source: Home / Self Care | Attending: Family Medicine

## 2019-11-17 ENCOUNTER — Other Ambulatory Visit: Payer: Self-pay

## 2019-11-17 ENCOUNTER — Encounter (HOSPITAL_COMMUNITY): Payer: Self-pay | Admitting: Vascular Surgery

## 2019-11-17 DIAGNOSIS — M1990 Unspecified osteoarthritis, unspecified site: Secondary | ICD-10-CM | POA: Diagnosis present

## 2019-11-17 DIAGNOSIS — D631 Anemia in chronic kidney disease: Secondary | ICD-10-CM | POA: Diagnosis present

## 2019-11-17 DIAGNOSIS — Z7982 Long term (current) use of aspirin: Secondary | ICD-10-CM

## 2019-11-17 DIAGNOSIS — E669 Obesity, unspecified: Secondary | ICD-10-CM | POA: Diagnosis present

## 2019-11-17 DIAGNOSIS — E1129 Type 2 diabetes mellitus with other diabetic kidney complication: Secondary | ICD-10-CM | POA: Diagnosis present

## 2019-11-17 DIAGNOSIS — Z794 Long term (current) use of insulin: Secondary | ICD-10-CM

## 2019-11-17 DIAGNOSIS — Z9109 Other allergy status, other than to drugs and biological substances: Secondary | ICD-10-CM

## 2019-11-17 DIAGNOSIS — I5042 Chronic combined systolic (congestive) and diastolic (congestive) heart failure: Secondary | ICD-10-CM | POA: Diagnosis present

## 2019-11-17 DIAGNOSIS — Z992 Dependence on renal dialysis: Secondary | ICD-10-CM

## 2019-11-17 DIAGNOSIS — I739 Peripheral vascular disease, unspecified: Secondary | ICD-10-CM | POA: Diagnosis not present

## 2019-11-17 DIAGNOSIS — H409 Unspecified glaucoma: Secondary | ICD-10-CM | POA: Diagnosis present

## 2019-11-17 DIAGNOSIS — G8918 Other acute postprocedural pain: Secondary | ICD-10-CM | POA: Diagnosis not present

## 2019-11-17 DIAGNOSIS — E1122 Type 2 diabetes mellitus with diabetic chronic kidney disease: Secondary | ICD-10-CM | POA: Diagnosis present

## 2019-11-17 DIAGNOSIS — Z885 Allergy status to narcotic agent status: Secondary | ICD-10-CM

## 2019-11-17 DIAGNOSIS — Z20822 Contact with and (suspected) exposure to covid-19: Secondary | ICD-10-CM | POA: Diagnosis present

## 2019-11-17 DIAGNOSIS — I70261 Atherosclerosis of native arteries of extremities with gangrene, right leg: Secondary | ICD-10-CM

## 2019-11-17 DIAGNOSIS — F419 Anxiety disorder, unspecified: Secondary | ICD-10-CM | POA: Diagnosis present

## 2019-11-17 DIAGNOSIS — Z7902 Long term (current) use of antithrombotics/antiplatelets: Secondary | ICD-10-CM

## 2019-11-17 DIAGNOSIS — H547 Unspecified visual loss: Secondary | ICD-10-CM | POA: Diagnosis present

## 2019-11-17 DIAGNOSIS — Z89611 Acquired absence of right leg above knee: Secondary | ICD-10-CM | POA: Diagnosis not present

## 2019-11-17 DIAGNOSIS — E8889 Other specified metabolic disorders: Secondary | ICD-10-CM | POA: Diagnosis present

## 2019-11-17 DIAGNOSIS — Z89612 Acquired absence of left leg above knee: Secondary | ICD-10-CM

## 2019-11-17 DIAGNOSIS — E1152 Type 2 diabetes mellitus with diabetic peripheral angiopathy with gangrene: Principal | ICD-10-CM | POA: Diagnosis present

## 2019-11-17 DIAGNOSIS — N186 End stage renal disease: Secondary | ICD-10-CM

## 2019-11-17 DIAGNOSIS — I132 Hypertensive heart and chronic kidney disease with heart failure and with stage 5 chronic kidney disease, or end stage renal disease: Secondary | ICD-10-CM | POA: Diagnosis present

## 2019-11-17 DIAGNOSIS — E11621 Type 2 diabetes mellitus with foot ulcer: Secondary | ICD-10-CM | POA: Diagnosis present

## 2019-11-17 DIAGNOSIS — N2581 Secondary hyperparathyroidism of renal origin: Secondary | ICD-10-CM | POA: Diagnosis present

## 2019-11-17 DIAGNOSIS — Z791 Long term (current) use of non-steroidal anti-inflammatories (NSAID): Secondary | ICD-10-CM

## 2019-11-17 DIAGNOSIS — Z79899 Other long term (current) drug therapy: Secondary | ICD-10-CM

## 2019-11-17 DIAGNOSIS — I96 Gangrene, not elsewhere classified: Secondary | ICD-10-CM | POA: Diagnosis not present

## 2019-11-17 DIAGNOSIS — N250 Renal osteodystrophy: Secondary | ICD-10-CM | POA: Diagnosis not present

## 2019-11-17 DIAGNOSIS — Z7952 Long term (current) use of systemic steroids: Secondary | ICD-10-CM

## 2019-11-17 DIAGNOSIS — J45909 Unspecified asthma, uncomplicated: Secondary | ICD-10-CM | POA: Diagnosis present

## 2019-11-17 DIAGNOSIS — E1140 Type 2 diabetes mellitus with diabetic neuropathy, unspecified: Secondary | ICD-10-CM | POA: Diagnosis present

## 2019-11-17 DIAGNOSIS — H543 Unqualified visual loss, both eyes: Secondary | ICD-10-CM | POA: Diagnosis present

## 2019-11-17 DIAGNOSIS — Z91040 Latex allergy status: Secondary | ICD-10-CM

## 2019-11-17 DIAGNOSIS — K219 Gastro-esophageal reflux disease without esophagitis: Secondary | ICD-10-CM | POA: Diagnosis present

## 2019-11-17 DIAGNOSIS — L97519 Non-pressure chronic ulcer of other part of right foot with unspecified severity: Secondary | ICD-10-CM | POA: Diagnosis present

## 2019-11-17 DIAGNOSIS — Z23 Encounter for immunization: Secondary | ICD-10-CM | POA: Diagnosis present

## 2019-11-17 DIAGNOSIS — Z6831 Body mass index (BMI) 31.0-31.9, adult: Secondary | ICD-10-CM

## 2019-11-17 DIAGNOSIS — Z853 Personal history of malignant neoplasm of breast: Secondary | ICD-10-CM

## 2019-11-17 DIAGNOSIS — T8744 Infection of amputation stump, left lower extremity: Secondary | ICD-10-CM | POA: Diagnosis not present

## 2019-11-17 DIAGNOSIS — L97418 Non-pressure chronic ulcer of right heel and midfoot with other specified severity: Secondary | ICD-10-CM | POA: Diagnosis not present

## 2019-11-17 DIAGNOSIS — I70231 Atherosclerosis of native arteries of right leg with ulceration of thigh: Secondary | ICD-10-CM | POA: Diagnosis not present

## 2019-11-17 DIAGNOSIS — I10 Essential (primary) hypertension: Secondary | ICD-10-CM | POA: Diagnosis present

## 2019-11-17 HISTORY — PX: AMPUTATION: SHX166

## 2019-11-17 LAB — POCT I-STAT, CHEM 8
BUN: 28 mg/dL — ABNORMAL HIGH (ref 8–23)
Calcium, Ion: 1.08 mmol/L — ABNORMAL LOW (ref 1.15–1.40)
Chloride: 96 mmol/L — ABNORMAL LOW (ref 98–111)
Creatinine, Ser: 5.7 mg/dL — ABNORMAL HIGH (ref 0.44–1.00)
Glucose, Bld: 68 mg/dL — ABNORMAL LOW (ref 70–99)
HCT: 32 % — ABNORMAL LOW (ref 36.0–46.0)
Hemoglobin: 10.9 g/dL — ABNORMAL LOW (ref 12.0–15.0)
Potassium: 3.7 mmol/L (ref 3.5–5.1)
Sodium: 140 mmol/L (ref 135–145)
TCO2: 32 mmol/L (ref 22–32)

## 2019-11-17 LAB — COMPREHENSIVE METABOLIC PANEL
ALT: 35 U/L (ref 0–44)
AST: 43 U/L — ABNORMAL HIGH (ref 15–41)
Albumin: 2.7 g/dL — ABNORMAL LOW (ref 3.5–5.0)
Alkaline Phosphatase: 211 U/L — ABNORMAL HIGH (ref 38–126)
Anion gap: 13 (ref 5–15)
BUN: 29 mg/dL — ABNORMAL HIGH (ref 8–23)
CO2: 29 mmol/L (ref 22–32)
Calcium: 8.5 mg/dL — ABNORMAL LOW (ref 8.9–10.3)
Chloride: 98 mmol/L (ref 98–111)
Creatinine, Ser: 5.79 mg/dL — ABNORMAL HIGH (ref 0.44–1.00)
GFR calc Af Amer: 8 mL/min — ABNORMAL LOW (ref >60)
GFR calc non Af Amer: 7 mL/min — ABNORMAL LOW (ref >60)
Glucose, Bld: 99 mg/dL (ref 70–99)
Potassium: 3.7 mmol/L (ref 3.5–5.1)
Sodium: 140 mmol/L (ref 135–145)
Total Bilirubin: 0.8 mg/dL (ref 0.3–1.2)
Total Protein: 6.5 g/dL (ref 6.5–8.1)

## 2019-11-17 LAB — PROTIME-INR
INR: 1.3 — ABNORMAL HIGH (ref 0.8–1.2)
Prothrombin Time: 15.2 seconds (ref 11.4–15.2)

## 2019-11-17 LAB — GLUCOSE, CAPILLARY
Glucose-Capillary: 82 mg/dL (ref 70–99)
Glucose-Capillary: 86 mg/dL (ref 70–99)
Glucose-Capillary: 211 mg/dL — ABNORMAL HIGH (ref 70–99)
Glucose-Capillary: 178 mg/dL — ABNORMAL HIGH (ref 70–99)

## 2019-11-17 LAB — CBC
HCT: 28.3 % — ABNORMAL LOW (ref 36.0–46.0)
Hemoglobin: 8.8 g/dL — ABNORMAL LOW (ref 12.0–15.0)
MCH: 30.4 pg (ref 26.0–34.0)
MCHC: 31.1 g/dL (ref 30.0–36.0)
MCV: 97.9 fL (ref 80.0–100.0)
Platelets: 262 10*3/uL (ref 150–400)
RBC: 2.89 MIL/uL — ABNORMAL LOW (ref 3.87–5.11)
RDW: 20.7 % — ABNORMAL HIGH (ref 11.5–15.5)
WBC: 8 10*3/uL (ref 4.0–10.5)
nRBC: 0 % (ref 0.0–0.2)

## 2019-11-17 LAB — TYPE AND SCREEN
ABO/RH(D): A POS
Antibody Screen: NEGATIVE

## 2019-11-17 LAB — SURGICAL PCR SCREEN
MRSA, PCR: NEGATIVE
Staphylococcus aureus: NEGATIVE

## 2019-11-17 LAB — APTT: aPTT: 33 seconds (ref 24–36)

## 2019-11-17 SURGERY — AMPUTATION, ABOVE KNEE
Anesthesia: General | Site: Knee | Laterality: Right

## 2019-11-17 MED ORDER — CEFAZOLIN SODIUM-DEXTROSE 1-4 GM/50ML-% IV SOLN
1.0000 g | Freq: Two times a day (BID) | INTRAVENOUS | Status: AC
  Administered 2019-11-17 – 2019-11-18 (×2): 1 g via INTRAVENOUS
  Filled 2019-11-17 (×2): qty 50

## 2019-11-17 MED ORDER — FENTANYL CITRATE (PF) 100 MCG/2ML IJ SOLN
INTRAMUSCULAR | Status: DC | PRN
  Administered 2019-11-17 (×2): 50 ug via INTRAVENOUS

## 2019-11-17 MED ORDER — NEOSTIGMINE METHYLSULFATE 5 MG/5ML IV SOSY
PREFILLED_SYRINGE | INTRAVENOUS | Status: DC | PRN
  Administered 2019-11-17: 3 mg via INTRAVENOUS

## 2019-11-17 MED ORDER — KETAMINE HCL 50 MG/5ML IJ SOSY
PREFILLED_SYRINGE | INTRAMUSCULAR | Status: AC
  Filled 2019-11-17: qty 5

## 2019-11-17 MED ORDER — ACETAMINOPHEN 500 MG PO TABS
ORAL_TABLET | ORAL | Status: AC
  Filled 2019-11-17: qty 2

## 2019-11-17 MED ORDER — CALCIUM CARBONATE ANTACID 1250 MG/5ML PO SUSP
500.0000 mg | Freq: Four times a day (QID) | ORAL | Status: DC | PRN
  Filled 2019-11-17: qty 5

## 2019-11-17 MED ORDER — LISINOPRIL 20 MG PO TABS
20.0000 mg | ORAL_TABLET | Freq: Every day | ORAL | Status: DC
  Administered 2019-11-17: 20 mg via ORAL
  Filled 2019-11-17: qty 1

## 2019-11-17 MED ORDER — ATROPINE SULFATE 1 % OP SOLN
1.0000 [drp] | Freq: Two times a day (BID) | OPHTHALMIC | Status: DC
  Administered 2019-11-18 – 2019-11-20 (×7): 1 [drp] via OPHTHALMIC
  Filled 2019-11-17: qty 2

## 2019-11-17 MED ORDER — NEPRO/CARBSTEADY PO LIQD: 237.0000 mL | Freq: Three times a day (TID) | ORAL | Status: DC | PRN

## 2019-11-17 MED ORDER — SORBITOL 70 % SOLN: 30.0000 mL | Status: DC | PRN

## 2019-11-17 MED ORDER — CELECOXIB 200 MG PO CAPS: 200.0000 mg | ORAL_CAPSULE | Freq: Once | ORAL | Status: AC

## 2019-11-17 MED ORDER — ACETAMINOPHEN 500 MG PO TABS: 1000.0000 mg | ORAL_TABLET | Freq: Once | ORAL | Status: DC

## 2019-11-17 MED ORDER — ROCURONIUM BROMIDE 50 MG/5ML IV SOSY
PREFILLED_SYRINGE | INTRAVENOUS | Status: DC | PRN
  Administered 2019-11-17: 40 mg via INTRAVENOUS

## 2019-11-17 MED ORDER — CAMPHOR-MENTHOL 0.5-0.5 % EX LOTN
1.0000 "application " | TOPICAL_LOTION | Freq: Three times a day (TID) | CUTANEOUS | Status: DC | PRN
  Filled 2019-11-17: qty 222

## 2019-11-17 MED ORDER — LANTHANUM CARBONATE 500 MG PO CHEW: 2000.0000 mg | CHEWABLE_TABLET | ORAL | Status: DC

## 2019-11-17 MED ORDER — ASPIRIN EC 81 MG PO TBEC
81.0000 mg | DELAYED_RELEASE_TABLET | Freq: Every day | ORAL | Status: DC
  Administered 2019-11-19 – 2019-11-20 (×2): 81 mg via ORAL
  Filled 2019-11-17 (×3): qty 1

## 2019-11-17 MED ORDER — GABAPENTIN 100 MG PO CAPS
100.0000 mg | ORAL_CAPSULE | Freq: Two times a day (BID) | ORAL | Status: DC
  Administered 2019-11-17 – 2019-11-20 (×6): 100 mg via ORAL
  Filled 2019-11-17 (×7): qty 1

## 2019-11-17 MED ORDER — CHLORHEXIDINE GLUCONATE CLOTH 2 % EX PADS
6.0000 | MEDICATED_PAD | Freq: Every day | CUTANEOUS | Status: DC
  Administered 2019-11-18 – 2019-11-21 (×3): 6 via TOPICAL

## 2019-11-17 MED ORDER — HYDROMORPHONE HCL 1 MG/ML IJ SOLN
0.5000 mg | INTRAMUSCULAR | Status: DC | PRN
  Administered 2019-11-17 – 2019-11-19 (×5): 0.5 mg via INTRAVENOUS
  Filled 2019-11-17 (×5): qty 1

## 2019-11-17 MED ORDER — HYDROMORPHONE HCL 1 MG/ML IJ SOLN
0.2500 mg | INTRAMUSCULAR | Status: DC | PRN
  Administered 2019-11-17 (×4): 0.25 mg via INTRAVENOUS

## 2019-11-17 MED ORDER — ONDANSETRON HCL 4 MG/2ML IJ SOLN
INTRAMUSCULAR | Status: DC | PRN
  Administered 2019-11-17: 4 mg via INTRAVENOUS

## 2019-11-17 MED ORDER — PHENOL 1.4 % MT LIQD: 1.0000 | OROMUCOSAL | Status: DC | PRN

## 2019-11-17 MED ORDER — MIDAZOLAM HCL 5 MG/5ML IJ SOLN
INTRAMUSCULAR | Status: DC | PRN
Start: 2019-11-17 — End: 2019-11-17
  Administered 2019-11-17: 2 mg via INTRAVENOUS

## 2019-11-17 MED ORDER — HYDROXYZINE HCL 25 MG PO TABS: 25.0000 mg | ORAL_TABLET | Freq: Three times a day (TID) | ORAL | Status: DC | PRN

## 2019-11-17 MED ORDER — INSULIN ASPART 100 UNIT/ML ~~LOC~~ SOLN
0.0000 [IU] | Freq: Three times a day (TID) | SUBCUTANEOUS | Status: DC
  Administered 2019-11-17 – 2019-11-20 (×6): 1 [IU] via SUBCUTANEOUS

## 2019-11-17 MED ORDER — LANTHANUM CARBONATE 500 MG PO CHEW
2000.0000 mg | CHEWABLE_TABLET | ORAL | Status: DC
  Administered 2019-11-18 – 2019-11-20 (×5): 2000 mg via ORAL
  Filled 2019-11-17 (×13): qty 4

## 2019-11-17 MED ORDER — DOCUSATE SODIUM 283 MG RE ENEM
1.0000 | ENEMA | RECTAL | Status: DC | PRN
  Filled 2019-11-17: qty 1

## 2019-11-17 MED ORDER — PANTOPRAZOLE SODIUM 40 MG PO TBEC
40.0000 mg | DELAYED_RELEASE_TABLET | Freq: Two times a day (BID) | ORAL | Status: DC
  Administered 2019-11-17 – 2019-11-20 (×7): 40 mg via ORAL
  Filled 2019-11-17 (×7): qty 1

## 2019-11-17 MED ORDER — PHENYLEPHRINE HCL (PRESSORS) 10 MG/ML IV SOLN
INTRAVENOUS | Status: DC | PRN
  Administered 2019-11-17 (×2): 120 ug via INTRAVENOUS
  Administered 2019-11-17: 80 ug via INTRAVENOUS
  Administered 2019-11-17: 120 ug via INTRAVENOUS

## 2019-11-17 MED ORDER — ALBUTEROL SULFATE (2.5 MG/3ML) 0.083% IN NEBU: 3.0000 mL | INHALATION_SOLUTION | Freq: Four times a day (QID) | RESPIRATORY_TRACT | Status: DC | PRN

## 2019-11-17 MED ORDER — EPHEDRINE SULFATE 50 MG/ML IJ SOLN
INTRAMUSCULAR | Status: DC | PRN
Start: 2019-11-17 — End: 2019-11-17
  Administered 2019-11-17: 10 mg via INTRAVENOUS

## 2019-11-17 MED ORDER — POTASSIUM CHLORIDE CRYS ER 20 MEQ PO TBCR: 20.0000 meq | EXTENDED_RELEASE_TABLET | Freq: Every day | ORAL | Status: DC | PRN

## 2019-11-17 MED ORDER — PROPOFOL 10 MG/ML IV BOLUS
INTRAVENOUS | Status: DC | PRN
  Administered 2019-11-17: 50 mg via INTRAVENOUS

## 2019-11-17 MED ORDER — PROPOFOL 10 MG/ML IV BOLUS
INTRAVENOUS | Status: AC
  Filled 2019-11-17: qty 20

## 2019-11-17 MED ORDER — TRAMADOL HCL 50 MG PO TABS
25.0000 mg | ORAL_TABLET | Freq: Two times a day (BID) | ORAL | Status: DC
  Administered 2019-11-17 – 2019-11-20 (×6): 25 mg via ORAL
  Filled 2019-11-17 (×7): qty 1

## 2019-11-17 MED ORDER — DULOXETINE HCL 30 MG PO CPEP
30.0000 mg | ORAL_CAPSULE | Freq: Every day | ORAL | Status: DC
  Administered 2019-11-18 – 2019-11-20 (×3): 30 mg via ORAL
  Filled 2019-11-17 (×3): qty 1

## 2019-11-17 MED ORDER — SODIUM CHLORIDE 0.9 % IV SOLN: INTRAVENOUS | Status: DC | PRN

## 2019-11-17 MED ORDER — CARVEDILOL 12.5 MG PO TABS
12.5000 mg | ORAL_TABLET | Freq: Two times a day (BID) | ORAL | Status: DC
  Administered 2019-11-17 – 2019-11-20 (×6): 12.5 mg via ORAL
  Filled 2019-11-17 (×7): qty 1

## 2019-11-17 MED ORDER — ACETAMINOPHEN 325 MG PO TABS
650.0000 mg | ORAL_TABLET | Freq: Four times a day (QID) | ORAL | Status: DC | PRN
  Administered 2019-11-19 – 2019-11-21 (×2): 650 mg via ORAL
  Filled 2019-11-17 (×2): qty 2

## 2019-11-17 MED ORDER — ONDANSETRON HCL 4 MG PO TABS: 4.0000 mg | ORAL_TABLET | Freq: Four times a day (QID) | ORAL | Status: DC | PRN

## 2019-11-17 MED ORDER — ACETAMINOPHEN 500 MG PO TABS
500.0000 mg | ORAL_TABLET | Freq: Once | ORAL | Status: AC
  Administered 2019-11-17: 500 mg via ORAL

## 2019-11-17 MED ORDER — CHLORHEXIDINE GLUCONATE CLOTH 2 % EX PADS: 6.0000 | MEDICATED_PAD | Freq: Once | CUTANEOUS | Status: DC

## 2019-11-17 MED ORDER — CELECOXIB 200 MG PO CAPS
ORAL_CAPSULE | ORAL | Status: AC
  Administered 2019-11-17: 200 mg via ORAL
  Filled 2019-11-17: qty 1

## 2019-11-17 MED ORDER — ACETAMINOPHEN 650 MG RE SUPP: 650.0000 mg | Freq: Four times a day (QID) | RECTAL | Status: DC | PRN

## 2019-11-17 MED ORDER — DEXAMETHASONE SODIUM PHOSPHATE 10 MG/ML IJ SOLN
INTRAMUSCULAR | Status: DC | PRN
  Administered 2019-11-17: 5 mg via INTRAVENOUS

## 2019-11-17 MED ORDER — 0.9 % SODIUM CHLORIDE (POUR BTL) OPTIME
TOPICAL | Status: DC | PRN
  Administered 2019-11-17: 1000 mL

## 2019-11-17 MED ORDER — PRO-STAT SUGAR FREE PO LIQD
30.0000 mL | Freq: Two times a day (BID) | ORAL | Status: DC
  Administered 2019-11-17 – 2019-11-20 (×7): 30 mL via ORAL
  Filled 2019-11-17 (×7): qty 30

## 2019-11-17 MED ORDER — HEPARIN SODIUM (PORCINE) 5000 UNIT/ML IJ SOLN
5000.0000 [IU] | Freq: Three times a day (TID) | INTRAMUSCULAR | Status: DC
  Administered 2019-11-17 – 2019-11-19 (×4): 5000 [IU] via SUBCUTANEOUS
  Filled 2019-11-17 (×4): qty 1

## 2019-11-17 MED ORDER — FENTANYL CITRATE (PF) 250 MCG/5ML IJ SOLN
INTRAMUSCULAR | Status: AC
  Filled 2019-11-17: qty 5

## 2019-11-17 MED ORDER — DOXERCALCIFEROL 4 MCG/2ML IV SOLN
1.0000 ug | INTRAVENOUS | Status: DC
  Administered 2019-11-21: 1 ug via INTRAVENOUS
  Filled 2019-11-17 (×2): qty 2

## 2019-11-17 MED ORDER — SODIUM CHLORIDE 0.9 % IV SOLN: INTRAVENOUS | Status: DC

## 2019-11-17 MED ORDER — GLYCOPYRROLATE PF 0.2 MG/ML IJ SOSY
PREFILLED_SYRINGE | INTRAMUSCULAR | Status: DC | PRN
  Administered 2019-11-17: .2 mg via INTRAVENOUS
  Administered 2019-11-17: .6 mg via INTRAVENOUS

## 2019-11-17 MED ORDER — PREDNISOLONE ACETATE 1 % OP SUSP
1.0000 [drp] | Freq: Two times a day (BID) | OPHTHALMIC | Status: DC
  Administered 2019-11-18 – 2019-11-20 (×7): 1 [drp] via OPHTHALMIC
  Filled 2019-11-17: qty 5

## 2019-11-17 MED ORDER — MIDAZOLAM HCL 2 MG/2ML IJ SOLN
INTRAMUSCULAR | Status: AC
  Filled 2019-11-17: qty 2

## 2019-11-17 MED ORDER — DIPHENHYDRAMINE HCL 25 MG PO CAPS
25.0000 mg | ORAL_CAPSULE | Freq: Three times a day (TID) | ORAL | Status: DC
  Administered 2019-11-17 – 2019-11-20 (×8): 25 mg via ORAL
  Filled 2019-11-17 (×12): qty 1

## 2019-11-17 MED ORDER — ZOLPIDEM TARTRATE 5 MG PO TABS: 5.0000 mg | ORAL_TABLET | Freq: Every evening | ORAL | Status: DC | PRN

## 2019-11-17 MED ORDER — LIDOCAINE 2% (20 MG/ML) 5 ML SYRINGE
INTRAMUSCULAR | Status: DC | PRN
  Administered 2019-11-17: 60 mg via INTRAVENOUS

## 2019-11-17 MED ORDER — ONDANSETRON HCL 4 MG/2ML IJ SOLN: 4.0000 mg | Freq: Four times a day (QID) | INTRAMUSCULAR | Status: DC | PRN

## 2019-11-17 MED ORDER — HYDROMORPHONE HCL 1 MG/ML IJ SOLN
INTRAMUSCULAR | Status: AC
  Administered 2019-11-18: 09:00:00 0.5 mg via INTRAVENOUS
  Filled 2019-11-17: qty 1

## 2019-11-17 MED ORDER — MUPIROCIN 2 % EX OINT
TOPICAL_OINTMENT | Freq: Once | CUTANEOUS | Status: AC
  Filled 2019-11-17: qty 22

## 2019-11-17 MED ORDER — PHENYLEPHRINE HCL-NACL 10-0.9 MG/250ML-% IV SOLN
INTRAVENOUS | Status: DC | PRN
  Administered 2019-11-17: 25 ug/min via INTRAVENOUS

## 2019-11-17 MED ORDER — KETAMINE HCL 10 MG/ML IJ SOLN
INTRAMUSCULAR | Status: DC | PRN
Start: 2019-11-17 — End: 2019-11-17
  Administered 2019-11-17: 10 mg via INTRAVENOUS

## 2019-11-17 MED ORDER — CEFAZOLIN SODIUM-DEXTROSE 2-4 GM/100ML-% IV SOLN
2.0000 g | INTRAVENOUS | Status: AC
  Administered 2019-11-17: 08:00:00 2 g via INTRAVENOUS
  Filled 2019-11-17: qty 100

## 2019-11-17 MED ORDER — MAGNESIUM SULFATE 2 GM/50ML IV SOLN
2.0000 g | Freq: Every day | INTRAVENOUS | Status: DC | PRN
  Filled 2019-11-17: qty 50

## 2019-11-17 MED ORDER — LANTHANUM CARBONATE 500 MG PO CHEW
4000.0000 mg | CHEWABLE_TABLET | Freq: Three times a day (TID) | ORAL | Status: DC
  Administered 2019-11-18 – 2019-11-20 (×7): 4000 mg via ORAL
  Filled 2019-11-17 (×13): qty 8

## 2019-11-17 SURGICAL SUPPLY — 49 items
BANDAGE ESMARK 6X9 LF (GAUZE/BANDAGES/DRESSINGS) IMPLANT
BLADE SAW GIGLI 510 (BLADE) ×2 IMPLANT
BLADE SAW GIGLI 510MM (BLADE) ×1
BNDG CMPR 9X6 STRL LF SNTH (GAUZE/BANDAGES/DRESSINGS)
BNDG ELASTIC 4X5.8 VLCR STR LF (GAUZE/BANDAGES/DRESSINGS) ×3 IMPLANT
BNDG ELASTIC 6X5.8 VLCR STR LF (GAUZE/BANDAGES/DRESSINGS) ×3 IMPLANT
BNDG ESMARK 6X9 LF (GAUZE/BANDAGES/DRESSINGS)
BNDG GAUZE ELAST 4 BULKY (GAUZE/BANDAGES/DRESSINGS) ×4 IMPLANT
CANISTER SUCT 3000ML PPV (MISCELLANEOUS) ×3 IMPLANT
CLIP LIGATING EXTRA MED SLVR (CLIP) ×3 IMPLANT
CLIP LIGATING EXTRA SM BLUE (MISCELLANEOUS) ×3 IMPLANT
COVER SURGICAL LIGHT HANDLE (MISCELLANEOUS) ×3 IMPLANT
COVER WAND RF STERILE (DRAPES) ×1 IMPLANT
CUFF TOURN SGL QUICK 34 (TOURNIQUET CUFF)
CUFF TOURN SGL QUICK 42 (TOURNIQUET CUFF) IMPLANT
CUFF TRNQT CYL 34X4.125X (TOURNIQUET CUFF) IMPLANT
DRAIN SNY 10X20 3/4 PERF (WOUND CARE) IMPLANT
DRAPE HALF SHEET 40X57 (DRAPES) ×3 IMPLANT
DRAPE ORTHO SPLIT 77X108 STRL (DRAPES) ×6
DRAPE SURG ORHT 6 SPLT 77X108 (DRAPES) ×2 IMPLANT
ELECT CAUTERY BLADE 6.4 (BLADE) ×3 IMPLANT
ELECT REM PT RETURN 9FT ADLT (ELECTROSURGICAL) ×3
ELECTRODE REM PT RTRN 9FT ADLT (ELECTROSURGICAL) ×1 IMPLANT
EVACUATOR SILICONE 100CC (DRAIN) IMPLANT
GAUZE SPONGE 4X4 12PLY STRL (GAUZE/BANDAGES/DRESSINGS) ×4 IMPLANT
GAUZE XEROFORM 1X8 LF (GAUZE/BANDAGES/DRESSINGS) ×4 IMPLANT
GAUZE XEROFORM 5X9 LF (GAUZE/BANDAGES/DRESSINGS) ×3 IMPLANT
GLOVE SS BIOGEL STRL SZ 7.5 (GLOVE) ×1 IMPLANT
GLOVE SUPERSENSE BIOGEL SZ 7.5 (GLOVE) ×2
GLOVE SURG SYN 7.5  E (GLOVE) ×3
GLOVE SURG SYN 7.5 E (GLOVE) ×1 IMPLANT
GOWN STRL REUS W/ TWL LRG LVL3 (GOWN DISPOSABLE) ×3 IMPLANT
GOWN STRL REUS W/TWL LRG LVL3 (GOWN DISPOSABLE) ×9
KIT BASIN OR (CUSTOM PROCEDURE TRAY) ×3 IMPLANT
KIT TURNOVER KIT B (KITS) ×3 IMPLANT
NS IRRIG 1000ML POUR BTL (IV SOLUTION) ×3 IMPLANT
PACK GENERAL/GYN (CUSTOM PROCEDURE TRAY) ×3 IMPLANT
PAD ARMBOARD 7.5X6 YLW CONV (MISCELLANEOUS) ×6 IMPLANT
PADDING CAST COTTON 6X4 STRL (CAST SUPPLIES) IMPLANT
PENCIL BUTTON HOLSTER BLD 10FT (ELECTRODE) ×2 IMPLANT
STAPLER VISISTAT 35W (STAPLE) ×3 IMPLANT
STOCKINETTE IMPERVIOUS LG (DRAPES) ×3 IMPLANT
SUT ETHILON 3 0 PS 1 (SUTURE) IMPLANT
SUT VIC AB 0 CT1 18XCR BRD 8 (SUTURE) ×2 IMPLANT
SUT VIC AB 0 CT1 8-18 (SUTURE) ×6
SUT VICRYL AB 2 0 TIES (SUTURE) ×3 IMPLANT
TOWEL GREEN STERILE (TOWEL DISPOSABLE) ×6 IMPLANT
UNDERPAD 30X30 (UNDERPADS AND DIAPERS) ×3 IMPLANT
WATER STERILE IRR 1000ML POUR (IV SOLUTION) ×3 IMPLANT

## 2019-11-17 NOTE — Anesthesia Procedure Notes (Signed)
Procedure Name: Intubation Date/Time: 11/17/2019 7:46 AM Performed by: Neldon Newport, CRNA Pre-anesthesia Checklist: Timeout performed, Patient being monitored, Suction available, Emergency Drugs available and Patient identified Patient Re-evaluated:Patient Re-evaluated prior to induction Oxygen Delivery Method: Circle system utilized Preoxygenation: Pre-oxygenation with 100% oxygen Induction Type: IV induction Ventilation: Mask ventilation without difficulty and Oral airway inserted - appropriate to patient size Laryngoscope Size: Mac and 3 Grade View: Grade I Tube type: Oral Tube size: 7.0 mm Number of attempts: 1 Placement Confirmation: ETT inserted through vocal cords under direct vision,  positive ETCO2 and breath sounds checked- equal and bilateral Secured at: 22 cm Tube secured with: Tape Dental Injury: Teeth and Oropharynx as per pre-operative assessment

## 2019-11-17 NOTE — Anesthesia Postprocedure Evaluation (Signed)
Anesthesia Post Note  Patient: [name]  Procedure(s) Performed: AMPUTATION ABOVE KNEE, right leg (Right Knee)     Patient location during evaluation: PACU Anesthesia Type: General Level of consciousness: awake and alert Pain management: pain level controlled Vital Signs Assessment: post-procedure vital signs reviewed and stable Respiratory status: spontaneous breathing, nonlabored ventilation, respiratory function stable and patient connected to nasal cannula oxygen Cardiovascular status: blood pressure returned to baseline and stable Postop Assessment: no apparent nausea or vomiting Anesthetic complications: no    Last Vitals:  Vitals:   11/17/19 1036 11/17/19 1106  BP: (!) 136/39 (!) 139/42  Pulse: 73 72  Resp: 12 10  Temp:    SpO2: 95% 97%    Last Pain:  Vitals:   11/17/19 1106  TempSrc:   PainSc: Asleep                 Timothey Dahlstrom,W. EDMOND

## 2019-11-17 NOTE — Interval H&P Note (Signed)
History and Physical Interval Note:  11/17/2019 7:24 AM  [name]  has presented today for surgery, with the diagnosis of RIGHT HEEL ULCERATION.  The various methods of treatment have been discussed with the patient and family. After consideration of risks, benefits and other options for treatment, the patient has consented to  Procedure(s): AMPUTATION ABOVE KNEE (Right) as a surgical intervention.  The patient's history has been reviewed, patient examined, no change in status, stable for surgery.  I have reviewed the patient's chart and labs.  Questions were answered to the patient's satisfaction.     Curt Jews

## 2019-11-17 NOTE — Consult Note (Addendum)
[facility] Renal Consultation Note    Indication for Consultation:  Management of ESRD/hemodialysis, anemia, hypertension/volume, and secondary hyperparathyroidism. PCP:  HPI: [name] is a 69 y.o. female with ESRD, T2DM, blindness, HTN, and PAD s/p recent L AKA 10/21/2019 who was now admitted s/p R AKA 11/17/2019 for non healing wound to R heel.  Seen in PACU s/p R AKA this morning. She is a little loopy, but denies CP, dyspnea, abdominal pain, N/V, diarrhea, fever or chills. No leg pain at the moment.  Labs today show Na 140, K 3.7, Hgb 10.9.  Dialyzes on MWF at [facility] - had a full dialysis yesterday (4/28) using her RUE AVF as her access. She denies any recent dialysis issues.  Past Medical History:  Diagnosis Date  . Anemia   . Anxiety   . Arthritis    "joints" (06/15/2013)  . Asthma   . 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 Blood transfusion without reported diagnosis   . Breast cancer ([place])    left  . CHF (congestive heart failure) ([place])   . 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 Esophageal ulcer with bleeding   . ESRD (end stage renal disease) ([place])    "suppose to start dialysis today" (06/15/2013)  . Family history of adverse reaction to anesthesia    It took a while for pt sister to wake from anesthesia  . GERD (gastroesophageal reflux disease)   . Glaucoma    blind in both eyes  . Heart murmur    Mild AS, moderate MR, moderate TR 10/30/16 echo  . Hx of adenomatous colonic polyps 04/07/2018  . Hypertension   . Myalgia 12/31/2011  . Neuropathy 12/31/2011  . PAD (peripheral artery disease) (HCC)    nonviable tissue left lower extremity  . Shortness of breath    "when she doesn't go to dialysis"  . Type II diabetes mellitus ([place])    Type II   Past Surgical History:  Procedure Laterality Date  . A/V FISTULAGRAM N/A 09/21/2018   Procedure: A/V FISTULAGRAM - Right Upper;  Surgeon: [name], MD;   Location: [place] [facility];  Service: Cardiovascular;  Laterality: N/A;  . ABDOMINAL AORTOGRAM N/A 11/30/2018   Procedure: ABDOMINAL AORTOGRAM;  Surgeon: [name], MD;  Location: [place] [facility];  Service: Cardiovascular;  Laterality: N/A;  . ABDOMINAL AORTOGRAM W/LOWER EXTREMITY Left 07/29/2019   Procedure: ABDOMINAL AORTOGRAM W/LOWER EXTREMITY;  Surgeon: [name], MD;  Location: [place] [facility];  Service: Cardiovascular;  Laterality: Left;  . ABDOMINAL HYSTERECTOMY     partial  . AMPUTATION Left 08/30/2019   Procedure: AMPUTATION BELOW KNEE LEFT;  Surgeon: [name], MD;  Location: [facility] OR;  Service: Vascular;  Laterality: Left;  . AMPUTATION Left 10/21/2019   Procedure: Amputation Above Knee;  Surgeon: Rosetta Posner, MD;  Location: [facility];  Service: Vascular;  Laterality: Left;  . AV FISTULA PLACEMENT Right 06/06/2013   Procedure: ARTERIOVENOUS (AV) FISTULA CREATION-RIGHT BRACHIAL CEPHALIC;  Surgeon: Conrad Newman, MD;  Location: [place];  Service: Vascular;  Laterality: Right;  . BREAST BIOPSY Left   . BREAST LUMPECTOMY Left    "and took out some lymph nodes" (06/15/2013)  . CARDIAC CATHETERIZATION     04/02/15 [facility]): no angiographic CAD, LVEF 40% with global hypokinesis (LHC done for + stress echo, EF 45% 11/28/14)  . CARPAL TUNNEL RELEASE Left 11/26/2017   Procedure:  LEFT CARPAL TUNNEL RELEASE;  Surgeon: [name], MD;  Location: [place];  Service: Orthopedics;  Laterality: Left;  . CATARACT EXTRACTION W/ ANTERIOR VITRECTOMY Bilateral   . CESAREAN SECTION  1980  . COLONOSCOPY    . ESOPHAGOGASTRODUODENOSCOPY N/A 07/08/2017   Procedure: ESOPHAGOGASTRODUODENOSCOPY (EGD);  Surgeon: [name], MD;  Location: [facility] ENDOSCOPY;  Service: Endoscopy;  Laterality: N/A;  . ESOPHAGOGASTRODUODENOSCOPY (EGD) WITH PROPOFOL N/A 02/07/2018   Procedure: ESOPHAGOGASTRODUODENOSCOPY (EGD) WITH PROPOFOL;  Surgeon: [name], MD;  Location: [facility] ENDOSCOPY;  Service:  Gastroenterology;  Laterality: N/A;  APC and clips placed  . EYE SURGERY Bilateral    laser surgery  . LOWER EXTREMITY ANGIOGRAPHY Bilateral 11/30/2018   Procedure: LOWER EXTREMITY ANGIOGRAPHY;  Surgeon: [name], MD;  Location: [place] [facility];  Service: Cardiovascular;  Laterality: Bilateral;  . PARS PLANA VITRECTOMY Right 05/05/2017   Procedure: PARS PLANA VITRECTOMY WITH 25 GAUGE; PARTIAL REMOVAL OF OIL; INFERIOR PERIPHERAL IRIDECTOMY, REFORM ANTERIOR CHAMBER RIGHT EYE;  Surgeon: [name], MD;  Location: [place];  Service: Ophthalmology;  Laterality: Right;  . PERIPHERAL VASCULAR BALLOON ANGIOPLASTY Right 09/21/2018   Procedure: PERIPHERAL VASCULAR BALLOON ANGIOPLASTY;  Surgeon: [name], MD;  Location: [place] [facility];  Service: Cardiovascular;  Laterality: Right;  AV fistula  . PERIPHERAL VASCULAR BALLOON ANGIOPLASTY Left 11/30/2018   Procedure: PERIPHERAL VASCULAR BALLOON ANGIOPLASTY;  Surgeon: [name], MD;  Location: [place] [facility];  Service: Cardiovascular;  Laterality: Left;  Left Anterior Tibial Artery  . REFRACTIVE SURGERY Bilateral   . REMOVAL OF A DIALYSIS CATHETER Right 06/06/2013   Procedure: REMOVAL OF RIGHT MEDIPORT;  Surgeon: Conrad Golva, MD;  Location: [place];  Service: Vascular;  Laterality: Right;  . TONSILLECTOMY    . UPPER GASTROINTESTINAL ENDOSCOPY    . WOUND DEBRIDEMENT Left 09/30/2019   Procedure: IRRIGATION AND DEBRIDEMENT WOUND OF LEFT BELOW KNEE AMPUTATION;  Surgeon: [name], MD;  Location: [facility] OR;  Service: Vascular;  Laterality: Left;   Family History  Problem Relation Age of Onset  . Diabetes Mother   . Hyperlipidemia Mother   . Hypertension Mother   . Hypertension Father   . Diabetes Sister   . Diabetes Brother   . Hypertension Brother   . Heart attack Brother   . Kidney disease Brother   . Colon cancer Neg Hx   . Colon polyps Neg Hx   . Esophageal cancer Neg Hx   . Gallbladder disease Neg Hx   . Rectal  cancer Neg Hx   . Stomach cancer Neg Hx    Social History:  reports that she has never smoked. She has never used smokeless tobacco. She reports that she does not drink alcohol or use drugs.  ROS: As per HPI otherwise negative.  Physical Exam: Vitals:   11/17/19 0945 11/17/19 0951 11/17/19 1006 11/17/19 1021  BP:  (!) 140/41 (!) 155/49 (!) 155/40  Pulse: 72 76 76 73  Resp: 10 11 10 12   Temp:      TempSrc:      SpO2: 99% 99% 93% 97%  Weight:      Height:         General: Elderly woman, blind, NAD. Slightly loopy s/p anesthesia Head: Normocephalic, atraumatic. Neck: Supple without lymphadenopathy/masses. JVD not elevated. Lungs: Clear bilaterally to auscultation without wheezes, rales, or rhonchi. Breathing is unlabored. Heart: RRR with normal, 4/6 systolic murmur noted Abdomen: Soft, non-tender, non-distended with normoactive bowel sounds. No rebound/guarding.  No obvious abdominal masses. Musculoskeletal:  Tone appear normal for age. Lower extremities: L AKA without stump edema, few staples in place. R AKA bandaged Neuro: Alert and oriented X 3. Moves all extremities spontaneously. Psych:  Responds to questions appropriately with a normal affect. Dialysis Access: RUE AVF + bruit  Allergies  Allergen Reactions  . Tape Itching and Rash    33M Transpore adhesive tape. Medical tape pulls off the skin!! PAPER TAPE ONLY, PLEASE  . Latex Hives  . Oxycodone Other (See Comments)    Hallucinations   . Tramadol Other (See Comments)    Hallucinations with a full tablet  . Morphine And Related Other (See Comments)    Hallucinations   . Vicodin [Hydrocodone-Acetaminophen] Other (See Comments)    Hallucinations    Prior to Admission medications   Medication Sig Start Date End Date Taking? Authorizing Provider  acetaminophen (TYLENOL) 500 MG tablet Take 1,000 mg by mouth 2 (two) times daily with a meal.    Yes [provider]  aspirin EC 81 MG tablet Take 81 mg by mouth  daily.   Yes [provider]  atropine 1 % ophthalmic solution Place 1 drop into the right eye 2 (two) times daily. 07/04/17  Yes [provider]  B Complex-C-Folic Acid (RENA-VITE RX) 1 MG TABS Take 1 tablet by mouth daily with breakfast.  09/29/16  Yes [provider]  carvedilol (COREG) 25 MG tablet Take 0.5 tablets (12.5 mg total) by mouth 2 (two) times daily. 10/24/19 11/23/19 Yes Alma Friendly, MD  clopidogrel (PLAVIX) 75 MG tablet Take 1 tablet (75 mg total) by mouth daily. 11/30/18  Yes Fields, Jessy Oto, MD  darbepoetin (ARANESP) 200 MCG/0.4ML SOLN injection Inject 0.4 mLs (200 mcg total) into the vein every Wednesday with hemodialysis. 06/22/13  Yes Eulogio Bear U, DO  diclofenac sodium (VOLTAREN) 1 % GEL Apply 2 g topically 4 (four) times daily as needed (pain).  04/28/19  Yes [provider]  diphenhydrAMINE (BENADRYL) 25 MG tablet Take 25 mg by mouth in the morning, at noon, and at bedtime.    Yes [provider]  docusate sodium (COLACE) 100 MG capsule Take 100 mg by mouth 2 (two) times daily with a meal.    Yes [provider]  doxercalciferol (HECTOROL) 4 MCG/2ML injection Inject 0.5 mLs (1 mcg total) into the vein every Monday, Wednesday, and Friday with hemodialysis. 06/18/13  Yes Vann, Jessica U, DO  DULoxetine (CYMBALTA) 30 MG capsule Take 30 mg by mouth daily.   Yes [provider]  gabapentin (NEURONTIN) 100 MG capsule Take 100 mg by mouth 2 (two) times daily.   Yes [provider]  insulin aspart (NOVOLOG FLEXPEN) 100 UNIT/ML FlexPen Inject 2-10 Units into the skin 2 (two) times daily with a meal.    Yes [provider]  lanthanum (FOSRENOL) 1000 MG chewable tablet Chew 2,000-4,000 mg by mouth See admin instructions. Chew 4,000 mg by mouth with each meal and 2,000 mg with each snack   Yes [provider]  lisinopril (ZESTRIL) 40 MG tablet Take 0.5 tablets (20 mg total) by mouth at bedtime.  10/24/19 11/23/19 Yes Alma Friendly, MD  omeprazole (PRILOSEC) 40 MG capsule Take 1 capsule (40 mg total) by mouth daily. Patient taking differently: Take 40 mg by mouth 2 (two) times daily.  02/09/18  Yes Cherene Altes, MD  prednisoLONE acetate (PRED FORTE) 1 % ophthalmic suspension Place 1 drop into the right eye 2 (two)  times daily.  04/27/17  Yes [provider]  traMADol (ULTRAM) 50 MG tablet Take 1 tablet (50 mg total) by mouth every 6 (six) hours as needed. Patient taking differently: Take 25 mg by mouth 2 (two) times daily.  10/01/19  Yes Ulyses Amor, PA-C  albuterol (PROVENTIL) (2.5 MG/3ML) 0.083% nebulizer solution Take 3 mLs (2.5 mg total) by nebulization every 6 (six) hours as needed for wheezing or shortness of breath. 10/30/16   Theodis Blaze, MD  albuterol (VENTOLIN HFA) 108 (90 Base) MCG/ACT inhaler Inhale 2 puffs into the lungs every 6 (six) hours as needed for wheezing or shortness of breath.     [provider]  Alcohol Swabs (B-D SINGLE USE SWABS REGULAR) PADS in the morning, at noon, and at bedtime.  03/08/19   [provider]  BD PEN NEEDLE NANO 2ND GEN 32G X 4 MM MISC in the morning, at noon, and at bedtime.  03/03/19   [provider]  fluticasone (FLONASE) 50 MCG/ACT nasal spray Place 2 sprays into both nostrils daily as needed for allergies.  02/07/19   [provider]  mupirocin ointment (BACTROBAN) 2 % Apply to affected area right foot once daily Patient not taking: Reported on 11/14/2019 08/16/19   Marzetta Board, DPM  ONETOUCH VERIO test strip 1 each by Other route 3 (three) times daily.  12/15/17   [provider]   Current Facility-Administered Medications  Medication Dose Route Frequency Provider Last Rate Last Admin  . 0.9 %  sodium chloride infusion   Intravenous Continuous Early, Arvilla Meres, MD      . acetaminophen (TYLENOL) 500 MG tablet           . Chlorhexidine Gluconate Cloth 2 % PADS 6 each  6 each  Topical Once Early, Arvilla Meres, MD       And  . Chlorhexidine Gluconate Cloth 2 % PADS 6 each  6 each Topical Once Early, Arvilla Meres, MD      . HYDROmorphone (DILAUDID) 1 MG/ML injection           . HYDROmorphone (DILAUDID) injection 0.25-0.5 mg  0.25-0.5 mg Intravenous Q5 min PRN Roderic Palau, MD   0.25 mg at 11/17/19 1031   Labs: Basic Metabolic Panel: Recent Labs  Lab 11/17/19 0626 11/17/19 0735  NA 140 140  K 3.7 3.7  CL 98 96*  CO2 29  --   GLUCOSE 99 68*  BUN 29* 28*  CREATININE 5.79* 5.70*  CALCIUM 8.5*  --    Liver Function Tests: Recent Labs  Lab 11/17/19 0626  AST 43*  ALT 35  ALKPHOS 211*  BILITOT 0.8  PROT 6.5  ALBUMIN 2.7*   CBC: Recent Labs  Lab 11/17/19 0626 11/17/19 0735  WBC 8.0  --   HGB 8.8* 10.9*  HCT 28.3* 32.0*  MCV 97.9  --   PLT 262  --    Dialysis Orders:  MWF @ [facility] 4hr, 400/A1.5, EDW 81kg, 2K/2.25Ca, AVF, UF profile 2, no heparin - Hectoral 21mcg IV q HD - Mircera 279mcg IV q 2 weeks (last 4/19)  Assessment/Plan: 1.  R heel wound, now s/p R AKA 4/29: Per vascular surgery team. 2.  ESRD:  Continue HD per MWF schedule - next Friday 4/30. No heparin. 3.  Hypertension/volume: BP slightly up. EDW will be lower s/p amputation. 4.  Anemia of ESRD: Hgb 10.9, but anticipate post-op drop. Next due for ESA 5/3. 5.  Metabolic bone disease: Ca ok, Phos  pending. Continue home VDRA + binders. 6.  Nutrition: Alb low - adding pro-stat supplements. 7.  T2DM: Per primary.  Veneta Penton, PA-C 11/17/2019, 10:35 AM  [facility]  I have seen and examined this patient and agree with plan and assessment in the above note with renal recommendations/intervention highlighted.  Ms. Convey is awake and eating lunch in PACU.  No leg pain or complaints at this time.  Exam unremarkable except for bilateral AKA's.  RUE AVF +T/B.  Plan for Hd tomorrow to keep on her outpatient schedule.  She tolerated surgery well.  Broadus John A Tamorah Hada,MD 11/17/2019  2:50 PM

## 2019-11-17 NOTE — Transfer of Care (Signed)
Immediate Anesthesia Transfer of Care Note  Patient: [name]  Procedure(s) Performed: AMPUTATION ABOVE KNEE, right leg (Right Knee)  Patient Location: PACU  Anesthesia Type:General  Level of Consciousness: awake, alert  and oriented  Airway & Oxygen Therapy: [name]  Post-op Assessment: Report given to RN, Post -op Vital signs reviewed and stable and Patient moving all extremities X 4  Post vital signs: Reviewed and stable  Last Vitals:  Vitals Value Taken Time  BP 130/42 11/17/19 0921  Temp    Pulse 75 11/17/19 0923  Resp 16 11/17/19 0923  SpO2 100 % 11/17/19 0923  Vitals shown include unvalidated device data.  Last Pain:  Vitals:   11/17/19 0713  TempSrc: Oral  PainSc:       Patients Stated Pain Goal: 3 (16/10/96 0454)  Complications: No apparent anesthesia complications

## 2019-11-17 NOTE — H&P (Signed)
History and Physical    [name] EPP:[id number] DOB: November 05, 1950 DOA: 11/17/2019  PCP: Prince Solian, MD Consultants:  [facility] - vascular; [place] - nephrology Patient coming from:  Home - lives with sister; NOK: Cathy, Kane, [phone]  Chief Complaint:  S/p R AKA  HPI: [name] is a 69 y.o. female with medical history significant of DM; PAD now s/p B AKA; HTN; ESRD on HD; and glaucoma leading to B blindness presenting for R AKA due to non-healing foot ulcer.  She is currently in PACU and reports feeling ok.  She is on MWF HD and was dialyzed yesterday.  Her pain is controlled but maybe getting ready to start in her RLE.  She has nursing care at home and plans to transition to home when ready.    Hospital Course:  Patient of Dr. Donnetta Hutching with R AKA today, prior h/o L AKA.  Multiple medical problems.  Would like TRH admission and disposition.  Her sister is very involved.  Review of Systems: As per HPI; otherwise review of systems reviewed and negative.     Past Medical History:  Diagnosis Date  . Anemia   . Anxiety   . Arthritis    "joints" (06/15/2013)  . Asthma   . 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 Blood transfusion without reported diagnosis   . Breast cancer ([place])    left  . CHF (congestive heart failure) ([place])   . 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 Esophageal ulcer with bleeding   . ESRD (end stage renal disease) ([place])    "suppose to start dialysis today" (06/15/2013)  . Family history of adverse reaction to anesthesia    It took a while for pt sister to wake from anesthesia  . GERD (gastroesophageal reflux disease)   . Glaucoma    blind in both eyes  . Heart murmur    Mild AS, moderate MR, moderate TR 10/30/16 echo  . Hx of adenomatous colonic polyps 04/07/2018  . Hypertension   . Myalgia 12/31/2011  . Neuropathy 12/31/2011  . PAD (peripheral artery disease) (HCC)    nonviable tissue left lower extremity    . Shortness of breath    "when she doesn't go to dialysis"  . Type II diabetes mellitus ([place])    Type II    Past Surgical History:  Procedure Laterality Date  . A/V FISTULAGRAM N/A 09/21/2018   Procedure: A/V FISTULAGRAM - Right Upper;  Surgeon: [name], MD;  Location: [place] [facility];  Service: Cardiovascular;  Laterality: N/A;  . ABDOMINAL AORTOGRAM N/A 11/30/2018   Procedure: ABDOMINAL AORTOGRAM;  Surgeon: [name], MD;  Location: [facility] [facility];  Service: Cardiovascular;  Laterality: N/A;  . ABDOMINAL AORTOGRAM W/LOWER EXTREMITY Left 07/29/2019   Procedure: ABDOMINAL AORTOGRAM W/LOWER EXTREMITY;  Surgeon: [name], MD;  Location: [place] [facility];  Service: Cardiovascular;  Laterality: Left;  . ABDOMINAL HYSTERECTOMY     partial  . AMPUTATION Left 08/30/2019   Procedure: AMPUTATION BELOW KNEE LEFT;  Surgeon: [name], MD;  [facility] OR;  Service: Vascular;  Laterality: Left;  . AMPUTATION Left 10/21/2019   Procedure: Amputation Above Knee;  Surgeon: Rosetta Posner, MD;  Location: [place];  Service: Vascular;  Laterality: Left;  . AV FISTULA PLACEMENT Right 06/06/2013   Procedure: ARTERIOVENOUS (AV) FISTULA CREATION-RIGHT BRACHIAL CEPHALIC;  Surgeon: Conrad Rapides, MD;  Location: [place];  Service: Vascular;  Laterality: Right;  . BREAST BIOPSY Left   . BREAST LUMPECTOMY Left    "and took out some lymph nodes" (06/15/2013)  . CARDIAC CATHETERIZATION     04/02/15 [facility]): no angiographic CAD, LVEF 40% with global hypokinesis (LHC done for + stress echo, EF 45% 11/28/14)  . CARPAL TUNNEL RELEASE Left 11/26/2017   Procedure: LEFT CARPAL TUNNEL RELEASE;  Surgeon: [name], MD;  Location: [place];  Service: Orthopedics;  Laterality: Left;  . CATARACT EXTRACTION W/ ANTERIOR VITRECTOMY Bilateral   . CESAREAN SECTION  1980  . COLONOSCOPY    . ESOPHAGOGASTRODUODENOSCOPY N/A 07/08/2017   Procedure: ESOPHAGOGASTRODUODENOSCOPY (EGD);  Surgeon:  [name], MD;  Location: [facility] ENDOSCOPY;  Service: Endoscopy;  Laterality: N/A;  . ESOPHAGOGASTRODUODENOSCOPY (EGD) WITH PROPOFOL N/A 02/07/2018   Procedure: ESOPHAGOGASTRODUODENOSCOPY (EGD) WITH PROPOFOL;  Surgeon: [name], MD;  Location: [facility] ENDOSCOPY;  Service: Gastroenterology;  Laterality: N/A;  APC and clips placed  . EYE SURGERY Bilateral    laser surgery  . LOWER EXTREMITY ANGIOGRAPHY Bilateral 11/30/2018   Procedure: LOWER EXTREMITY ANGIOGRAPHY;  Surgeon: [name], MD;  Location: [place] [facility];  Service: Cardiovascular;  Laterality: Bilateral;  . PARS PLANA VITRECTOMY Right 05/05/2017   Procedure: PARS PLANA VITRECTOMY WITH 25 GAUGE; PARTIAL REMOVAL OF OIL; INFERIOR PERIPHERAL IRIDECTOMY, REFORM ANTERIOR CHAMBER RIGHT EYE;  Surgeon: [name], MD;  Location: [place];  Service: Ophthalmology;  Laterality: Right;  . PERIPHERAL VASCULAR BALLOON ANGIOPLASTY Right 09/21/2018   Procedure: PERIPHERAL VASCULAR BALLOON ANGIOPLASTY;  Surgeon: [name], MD;  Location: [place] [facility];  Service: Cardiovascular;  Laterality: Right;  AV fistula  . PERIPHERAL VASCULAR BALLOON ANGIOPLASTY Left 11/30/2018   Procedure: PERIPHERAL VASCULAR BALLOON ANGIOPLASTY;  Surgeon: [name], MD;  Location: [place] [facility];  Service: Cardiovascular;  Laterality: Left;  Left Anterior Tibial Artery  . REFRACTIVE SURGERY Bilateral   . REMOVAL OF A DIALYSIS CATHETER Right 06/06/2013   Procedure: REMOVAL OF RIGHT MEDIPORT;  Surgeon: Conrad [place]ville, MD;  Location: [place];  Service: Vascular;  Laterality: Right;  . TONSILLECTOMY    . UPPER GASTROINTESTINAL ENDOSCOPY    . WOUND DEBRIDEMENT Left 09/30/2019   Procedure: IRRIGATION AND DEBRIDEMENT WOUND OF LEFT BELOW KNEE AMPUTATION;  Surgeon: [name], MD;  Location: [facility] OR;  Service: Vascular;  Laterality: Left;    Social History   Socioeconomic History  . Marital status: Single    Spouse name: Not on file  . Number of  children: 2  . Years of education: 46  . Highest education level: Not on file  Occupational History  . Occupation: Diabled  Tobacco Use  . Smoking status: Never Smoker  . Smokeless tobacco: Never Used  Substance and Sexual Activity  . Alcohol use: No  . Drug use: No  . Sexual activity: Not Currently    Comment: Hysterectomy  Other Topics Concern  . Not on file  Social History Narrative   She reports she is single with one son and one daughter. 3 caffeinated beverages a day. She dialyzes Monday Wednesday Friday at the [facility].   11/01/2014   Social Determinants of Health   Financial Resource Strain:   . Difficulty of Paying Living Expenses:   Food Insecurity:   . Worried About Charity fundraiser in the Last Year:   . Arboriculturist in the Last Year:   Transportation Needs: No Transportation Needs  .  Lack of Transportation (Medical): No  . Lack of Transportation (Non-Medical): No  Physical Activity:   . Days of Exercise per Week:   . Minutes of Exercise per Session:   Stress:   . Feeling of Stress :   Social Connections:   . Frequency of Communication with Friends and Family:   . Frequency of Social Gatherings with Friends and Family:   . Attends Religious Services:   . Active Member of Clubs or Organizations:   . Attends Archivist Meetings:   Marland Kitchen Marital Status:   Intimate Partner Violence:   . Fear of Current or Ex-Partner:   . Emotionally Abused:   Marland Kitchen Physically Abused:   . Sexually Abused:     Allergies  Allergen Reactions  . Tape Itching and Rash    19M Transpore adhesive tape. Medical tape pulls off the skin!! PAPER TAPE ONLY, PLEASE  . Latex Hives  . Oxycodone Other (See Comments)    Hallucinations   . Tramadol Other (See Comments)    Hallucinations with a full tablet  . Morphine And Related Other (See Comments)    Hallucinations   . Vicodin [Hydrocodone-Acetaminophen] Other (See Comments)    Hallucinations     Family  History  Problem Relation Age of Onset  . Diabetes Mother   . Hyperlipidemia Mother   . Hypertension Mother   . Hypertension Father   . Diabetes Sister   . Diabetes Brother   . Hypertension Brother   . Heart attack Brother   . Kidney disease Brother   . Colon cancer Neg Hx   . Colon polyps Neg Hx   . Esophageal cancer Neg Hx   . Gallbladder disease Neg Hx   . Rectal cancer Neg Hx   . Stomach cancer Neg Hx     Prior to Admission medications   Medication Sig Start Date End Date Taking? Authorizing Provider  acetaminophen (TYLENOL) 500 MG tablet Take 1,000 mg by mouth 2 (two) times daily with a meal.    Yes [provider]  aspirin EC 81 MG tablet Take 81 mg by mouth daily.   Yes [provider]  atropine 1 % ophthalmic solution Place 1 drop into the right eye 2 (two) times daily. 07/04/17  Yes [provider]  B Complex-C-Folic Acid (RENA-VITE RX) 1 MG TABS Take 1 tablet by mouth daily with breakfast.  09/29/16  Yes [provider]  carvedilol (COREG) 25 MG tablet Take 0.5 tablets (12.5 mg total) by mouth 2 (two) times daily. 10/24/19 11/23/19 Yes Alma Friendly, MD  clopidogrel (PLAVIX) 75 MG tablet Take 1 tablet (75 mg total) by mouth daily. 11/30/18  Yes Fields, Jessy Oto, MD  darbepoetin (ARANESP) 200 MCG/0.4ML SOLN injection Inject 0.4 mLs (200 mcg total) into the vein every Wednesday with hemodialysis. 06/22/13  Yes Eulogio Bear U, DO  diclofenac sodium (VOLTAREN) 1 % GEL Apply 2 g topically 4 (four) times daily as needed (pain).  04/28/19  Yes [provider]  diphenhydrAMINE (BENADRYL) 25 MG tablet Take 25 mg by mouth in the morning, at noon, and at bedtime.    Yes [provider]  docusate sodium (COLACE) 100 MG capsule Take 100 mg by mouth 2 (two) times daily with a meal.    Yes [provider]  doxercalciferol (HECTOROL) 4 MCG/2ML injection Inject 0.5 mLs (1 mcg total) into the vein every Monday, Wednesday, and  Friday with hemodialysis. 06/18/13  Yes Geradine Girt, DO  DULoxetine (CYMBALTA)  30 MG capsule Take 30 mg by mouth daily.   Yes [provider]  gabapentin (NEURONTIN) 100 MG capsule Take 100 mg by mouth 2 (two) times daily.   Yes [provider]  insulin aspart (NOVOLOG FLEXPEN) 100 UNIT/ML FlexPen Inject 2-10 Units into the skin 2 (two) times daily with a meal.    Yes [provider]  lanthanum (FOSRENOL) 1000 MG chewable tablet Chew 2,000-4,000 mg by mouth See admin instructions. Chew 4,000 mg by mouth with each meal and 2,000 mg with each snack   Yes [provider]  lisinopril (ZESTRIL) 40 MG tablet Take 0.5 tablets (20 mg total) by mouth at bedtime. 10/24/19 11/23/19 Yes Alma Friendly, MD  omeprazole (PRILOSEC) 40 MG capsule Take 1 capsule (40 mg total) by mouth daily. Patient taking differently: Take 40 mg by mouth 2 (two) times daily.  02/09/18  Yes Cherene Altes, MD  prednisoLONE acetate (PRED FORTE) 1 % ophthalmic suspension Place 1 drop into the right eye 2 (two) times daily.  04/27/17  Yes [provider]  traMADol (ULTRAM) 50 MG tablet Take 1 tablet (50 mg total) by mouth every 6 (six) hours as needed. Patient taking differently: Take 25 mg by mouth 2 (two) times daily.  10/01/19  Yes Ulyses Amor, PA-C  albuterol (PROVENTIL) (2.5 MG/3ML) 0.083% nebulizer solution Take 3 mLs (2.5 mg total) by nebulization every 6 (six) hours as needed for wheezing or shortness of breath. 10/30/16   Theodis Blaze, MD  albuterol (VENTOLIN HFA) 108 (90 Base) MCG/ACT inhaler Inhale 2 puffs into the lungs every 6 (six) hours as needed for wheezing or shortness of breath.     [provider]  Alcohol Swabs (B-D SINGLE USE SWABS REGULAR) PADS in the morning, at noon, and at bedtime.  03/08/19   [provider]  BD PEN NEEDLE NANO 2ND GEN 32G X 4 MM MISC in the morning, at noon, and at bedtime.  03/03/19   [provider]  fluticasone  (FLONASE) 50 MCG/ACT nasal spray Place 2 sprays into both nostrils daily as needed for allergies.  02/07/19   [provider]  mupirocin ointment (BACTROBAN) 2 % Apply to affected area right foot once daily Patient not taking: Reported on 11/14/2019 08/16/19   Marzetta Board, DPM  ONETOUCH VERIO test strip 1 each by Other route 3 (three) times daily.  12/15/17   [provider]    Physical Exam: Vitals:   11/17/19 1006 11/17/19 1021 11/17/19 1036 11/17/19 1106  BP: (!) 155/49 (!) 155/40 (!) 136/39 (!) 139/42  Pulse: 76 73 73 72  Resp: 10 12 12 10   Temp:      TempSrc:      SpO2: 93% 97% 95% 97%  Weight:      Height:         . General:  Appears calm and comfortable and is NAD . Eyes:   Chronic changes suggestive of chronic visual disturbance . ENT:  grossly normal hearing, lips & tongue, mmm; mostly appropriate dentition . Neck:  no LAD, masses or thyromegaly . Cardiovascular:  RRR, no r/g, 6-7/6 systolic murmur (patient states this is chronic).  Marland Kitchen Respiratory:   CTA bilaterally with no wheezes/rales/rhonchi.  Normal respiratory effort. . Abdomen:  soft, NT, ND, NABS . Skin:  no rash or induration seen on limited exam . Musculoskeletal:  S/p B AKA - left has small bandage distally and R is wrapped s/p surgery this AM . Psychiatric:  grossly normal mood and affect, speech fluent and appropriate, AOx3 Neurologic:  CN 2-12 grossly intact    Radiological Exams on Admission: No results found.  EKG: not done   Labs on Admission: I have personally reviewed the available labs and imaging studies at the time of the admission.  Pertinent labs:   Glucose 99, 82, 68, 86 BUN 29/Creatinine 5.79/GFR 8 AP 211 Albumin 2.7 AST 43/ALT 35 WBC 8.0 Hgb 8.8 INR 1.3 MRSA PCR negative 4/27 COVID negative   Assessment/Plan Principal Problem:   S/P AKA (above knee amputation), right (HCC) Active Problems:   Type II diabetes mellitus with renal manifestations (HCC)    Essential hypertension   ESRD on dialysis (HCC)   Blindness of both eyes   Chronic combined systolic and diastolic CHF (congestive heart failure) (HCC)   Anemia in ESRD (end-stage renal disease) (HCC)   Asthma   PAD (peripheral artery disease) (HCC)   PAD, now s/p B AKA -Patient was seen early this AM for R AKA -Prior L AKA with some poor stump healing but better now according to Dr. Donnetta Hutching -Will admit for ongoing monitoring, therapy assessments, and medical management -PT/OT consults requested -Patient and sister are planning for her to return home without CIR/SNF rehab at this time -Continue ASA, Plavix -Continue Neurontin, Ultram  ESRD on MWF HD -Patient on chronic MWF HD -Nephrology prn order set utilized -She does not appear to be volume overloaded or otherwise in need of acute HD -Nephrology is aware that patient will need HD and has placed orders for tomorrow -Continue Hectorol, Fosrenol -Anemia appears to be stable at this time; will follow post-operatively -Continue Aranesp  HTN -Continue Coreg, Lisinopril  DM -Good control on SSI monotherapy -Will cover with very sensitive-scale SSI while hospitalized  Blindness -B vision loss -Will need additional nursing assistance while hospitalized -Continue Atropine and Prednisolone drops  Asthma -PRN albuterol HFA  Obesity Body mass index is 31.62 kg/m. -Weight loss should be encouraged, but will be difficult given mobility limitations     Note: This patient has been tested and is negative for the novel coronavirus COVID-19.  DVT prophylaxis:  Heparin Code Status:  Full  Family Communication: None present; I spoke with the patient's sister by telephone at the time of admission. Disposition Plan:  The patient is from: home  Anticipated d/c is to: home with [facility] services   Anticipated d/c date will depend on clinical response to treatment, likely 2-3 days  Patient is currently: stable post-operatively Consults  called: Vascular surgery; PT/OT Admission status:  Admit - It is my clinical opinion that admission to [facility] is reasonable and necessary because of the expectation that this patient will require hospital care that crosses at least 2 midnights to treat this condition based on the medical complexity of the problems presented.  Given the aforementioned information, the predictability of an adverse outcome is felt to be significant.    Karmen Bongo MD [facility]   How to contact the [facility] Attending or Consulting provider [place] or covering provider during after hours [place], for this patient?  1. Check the care team in [facility] and look for a) attending/consulting TRH provider listed and b) the [facility] team listed 2. Log into [email or web address] and use [place]'s universal password to access. If you do not have the password, please contact the hospital operator. 3. Locate the [facility] provider you are looking for under [facility] and page to a number that you can be directly  reached. 4. If you still have difficulty reaching the provider, please page the [facility] (Director on Call) for the Hospitalists listed on amion for assistance.   11/17/2019, 11:17 AM

## 2019-11-17 NOTE — Op Note (Signed)
    OPERATIVE REPORT  DATE OF SURGERY: 11/17/2019  PATIENT: [name], 69 y.o. female MRN: [id number]  DOB: [birth year]  PRE-OPERATIVE DIAGNOSIS: Gangrene right foot  POST-OPERATIVE DIAGNOSIS:  Same  PROCEDURE: Right above-knee amputation  SURGEON:  Curt Jews, M.D.  PHYSICIAN ASSISTANT: Liana Crocker, PA-C  ANESTHESIA: General  EBL: per anesthesia record  Total I/O In: 978.9 [I.V.:878.9; IV Piggyback:100] Out: 200 [Blood:200]  BLOOD ADMINISTERED: none  DRAINS: none  SPECIMEN: none  COUNTS CORRECT:  YES  PATIENT DISPOSITION:  PACU - hemodynamically stable  PROCEDURE DETAILS: Patient was taken operating placed supine position where the area of the right leg prepped draped in sterile fashion.  [place] type incision was made on the distal thigh and carried down through the fascia to the level of the muscle.  The muscle was divided with electrocautery in line with the skin incision.  There was no evidence of infection or ischemia at the level of the amputation.  The superficial femoral artery was ligated and divided.  It was circumferentially calcified.  The femoral vein was ligated and divided.  Periosteum was elevated off the femur and the femur was divided with a Gigli saw.  The bone edges were smoothed with a bone rasp.  The periosteum was closed over the femur with a figure-of-eight Vicryl suture.  The anterior fascia was closed to the posterior fascia with interrupted figure-of-eight 0 Vicryl sutures.  Skin was closed with skin staples.  A sterile dressing and Ace wrap was applied the patient was transferred to the recovery room in stable condition   Rosetta Posner, M.D., [facility] 11/17/2019 9:45 AM

## 2019-11-17 NOTE — Progress Notes (Signed)
Pt arrived to unit alert/oriented in no apparent distress.  Pt hand guide/menu provided, explainted and educated pt with no complaints voiced. NO complaints

## 2019-11-18 LAB — CBC
HCT: 24.1 % — ABNORMAL LOW (ref 36.0–46.0)
Hemoglobin: 7.6 g/dL — ABNORMAL LOW (ref 12.0–15.0)
MCH: 30.5 pg (ref 26.0–34.0)
MCHC: 31.5 g/dL (ref 30.0–36.0)
MCV: 96.8 fL (ref 80.0–100.0)
Platelets: 309 10*3/uL (ref 150–400)
RBC: 2.49 MIL/uL — ABNORMAL LOW (ref 3.87–5.11)
RDW: 20.3 % — ABNORMAL HIGH (ref 11.5–15.5)
WBC: 9.6 10*3/uL (ref 4.0–10.5)
nRBC: 0 % (ref 0.0–0.2)

## 2019-11-18 LAB — BASIC METABOLIC PANEL
Anion gap: 17 — ABNORMAL HIGH (ref 5–15)
BUN: 37 mg/dL — ABNORMAL HIGH (ref 8–23)
CO2: 26 mmol/L (ref 22–32)
Calcium: 8.6 mg/dL — ABNORMAL LOW (ref 8.9–10.3)
Chloride: 97 mmol/L — ABNORMAL LOW (ref 98–111)
Creatinine, Ser: 7.02 mg/dL — ABNORMAL HIGH (ref 0.44–1.00)
GFR calc Af Amer: 6 mL/min — ABNORMAL LOW (ref >60)
GFR calc non Af Amer: 6 mL/min — ABNORMAL LOW (ref >60)
Glucose, Bld: 109 mg/dL — ABNORMAL HIGH (ref 70–99)
Potassium: 5 mmol/L (ref 3.5–5.1)
Sodium: 140 mmol/L (ref 135–145)

## 2019-11-18 LAB — GLUCOSE, CAPILLARY
Glucose-Capillary: 145 mg/dL — ABNORMAL HIGH (ref 70–99)
Glucose-Capillary: 154 mg/dL — ABNORMAL HIGH (ref 70–99)
Glucose-Capillary: 131 mg/dL — ABNORMAL HIGH (ref 70–99)

## 2019-11-18 MED ORDER — PNEUMOCOCCAL VAC POLYVALENT 25 MCG/0.5ML IJ INJ
0.5000 mL | INJECTION | INTRAMUSCULAR | Status: AC
  Administered 2019-11-19: 0.5 mL via INTRAMUSCULAR
  Filled 2019-11-18: qty 0.5

## 2019-11-18 MED ORDER — ALBUMIN HUMAN 25 % IV SOLN
25.0000 g | Freq: Once | INTRAVENOUS | Status: AC
  Administered 2019-11-18: 25 g via INTRAVENOUS

## 2019-11-18 MED ORDER — DOXERCALCIFEROL 4 MCG/2ML IV SOLN
INTRAVENOUS | Status: AC
  Administered 2019-11-18: 1 ug via INTRAVENOUS
  Filled 2019-11-18: qty 2

## 2019-11-18 MED ORDER — ALBUMIN HUMAN 25 % IV SOLN
INTRAVENOUS | Status: AC
  Filled 2019-11-18: qty 100

## 2019-11-18 MED ORDER — HYDROMORPHONE HCL 1 MG/ML IJ SOLN
INTRAMUSCULAR | Status: AC
  Filled 2019-11-18: qty 0.5

## 2019-11-18 NOTE — Progress Notes (Addendum)
  Progress Note    11/18/2019 7:23 AM 1 Day Post-Op  Subjective:  States she feels fine. No real pain   Vitals:   11/18/19 0048 11/18/19 0445  BP: (!) 107/37 (!) 108/39  Pulse: 85 84  Resp:  18  Temp: 98.3 F (36.8 C) 98.8 F (37.1 C)  SpO2: 90% 94%   Physical Exam: General: well appearing, well nourished, not in any distress Lungs:  Non labored Incisions: left above knee amputation dressings clean, dry and intact. No tenderness. Right AKA site dressings clean, dry and intact. There was some bleeding from site as blood on pillow case and blanket but since dressings reinforced no break through bleeding Extremities: 2+ femoral pulses bilaterally Abdomen:  Obese, soft, non tender Neurologic: alert and oriented  CBC    Component Value Date/Time   WBC 9.6 11/18/2019 0259   RBC 2.49 (L) 11/18/2019 0259   HGB 7.6 (L) 11/18/2019 0259   HGB 6.5 (LL) 06/13/2013 1037   HCT 24.1 (L) 11/18/2019 0259   HCT 20.6 (L) 06/13/2013 1037   PLT 309 11/18/2019 0259   PLT 367 06/13/2013 1037   MCV 96.8 11/18/2019 0259   MCV 83.4 06/13/2013 1037   MCH 30.5 11/18/2019 0259   MCHC 31.5 11/18/2019 0259   RDW 20.3 (H) 11/18/2019 0259   RDW 16.0 (H) 06/13/2013 1037   LYMPHSABS 2.5 10/24/2019 0224   LYMPHSABS 1.6 06/13/2013 1037   MONOABS 1.3 (H) 10/24/2019 0224   MONOABS 0.5 06/13/2013 1037   EOSABS 0.4 10/24/2019 0224   EOSABS 0.3 06/13/2013 1037   BASOSABS 0.1 10/24/2019 0224   BASOSABS 0.0 06/13/2013 1037    BMET    Component Value Date/Time   NA 140 11/18/2019 0259   NA 141 06/13/2013 1037   K 5.0 11/18/2019 0259   K 4.4 06/13/2013 1037   CL 97 (L) 11/18/2019 0259   CL 110 (H) 10/01/2012 1052   CO2 26 11/18/2019 0259   CO2 20 (L) 06/13/2013 1037   GLUCOSE 109 (H) 11/18/2019 0259   GLUCOSE 124 06/13/2013 1037   GLUCOSE 101 (H) 10/01/2012 1052   BUN 37 (H) 11/18/2019 0259   BUN 93.4 (H) 06/13/2013 1037   CREATININE 7.02 (H) 11/18/2019 0259   CREATININE 9.3 (HH) 06/13/2013  1037   CALCIUM 8.6 (L) 11/18/2019 0259   CALCIUM 8.8 06/13/2013 1037   GFRNONAA 6 (L) 11/18/2019 0259   GFRAA 6 (L) 11/18/2019 0259    INR    Component Value Date/Time   INR 1.3 (H) 11/17/2019 0626     Intake/Output Summary (Last 24 hours) at 11/18/2019 0723 Last data filed at 11/17/2019 3474 Gross per 24 hour  Intake 978.88 ml  Output 200 ml  Net 778.88 ml     Assessment/Plan:  69 y.o. female is s/p right above knee amputation 1 Day Post-Op. She is doing well post op. Right AKA site dressings will be taken down tomorrow. Reinforce as needed. Hgb 7.6 will need to monitor. Blood pressure soft. Post op pain well managed    Karoline Caldwell, PA-C [facility] [phone] 11/18/2019 7:23 AM   I have examined the patient, reviewed and agree with above. Does have some bloody drainage through dressing. The patient had extensive subcutaneous edema at the incision site. Drainage appears to be more edema fluid. Repeat H&H tomorrow. Takedown dressing tomorrow.  Curt Jews, MD 11/18/2019 11:38 AM

## 2019-11-18 NOTE — Progress Notes (Signed)
PT Cancellation Note  Patient Details Name: LEAIRA FULLAM MRN: [id number] DOB: 04-25-1951   Cancelled Treatment:    Reason Eval/Treat Not Completed: Patient at procedure or test/unavailable Pt at HD. Will follow.   Marguarite Arbour A Daxx Tiggs 11/18/2019, 1:24 PM Marisa Severin, PT, DPT Acute Rehabilitation Services Pager [phone] Office [phone]

## 2019-11-18 NOTE — TOC Initial Note (Signed)
Transition of Care [facility]) - Initial/Assessment Note    Patient Details  Name: [name] MRN: [id number] Date of Birth: [birth year]  Transition of Care [facility]) CM/SW Contact:    Curlene Labrum, RN Phone Number: 11/18/2019, 3:27 PM  Clinical Narrative:                 69 y.o. female is s/p right above knee amputation 1 Day Post-Op.  Patient lives at home with multiple family members to assist.  Spoke with patient's sister, [name], and filled out and faxed Parts A and B to SCAT application and faxed it back accordingly to have SCAT started ASAP.  Extra copy of SCAT application given to the sister.  Offered Medicare choice to the family to include [facility] home through [place], but sister declined and stated that the patient was very happy with [facility] services understandably.  [facility] called and home health to be continued for RN, PT, OT, aide, and MSW.  Will continue to follow.  Expected Discharge Plan: [place] Barriers to Discharge: Continued Medical Work up   Patient Goals and CMS Choice Patient states their goals for this hospitalization and ongoing recovery are:: Patient and family look forward to patient getting better and going home. CMS Medicare.gov Compare Post Acute Care list provided to:: Patient Represenative (must comment)([place], sister) Choice offered to / list presented to : Sibling  Expected Discharge Plan and Services Expected Discharge Plan: [place] In-house Referral: (SCAT paperwork Part A and B and Faxed to SCAT on 11/18/2019) Discharge Planning Services: CM Consult Post Acute Care Choice: [place] arrangements for the past 2 months: [place] Arranged: RN, PT, OT, Social Work, Nurse's Aide(patient currently with [facility] and family would like to stay with them - declined [facility]) [place]: [facility] Date [facility] Agency Contacted: 11/18/19 Time [place]: 85 Representative spoke with at [place]: Lauretta Chester  Prior Living Arrangements/Services Living arrangements for the past 2 months: [place] with:: Siblings, Adult Children Patient language and need for interpreter reviewed:: Yes Do you feel safe going back to the place where you live?: Yes      Need for Family Participation in Patient Care: Yes (Comment) Care giver support system in place?: Yes (comment) Current home services: Home OT, Home PT, Home RN Criminal Activity/Legal Involvement Pertinent to Current Situation/Hospitalization: No - Comment as needed  Activities of Daily Living Home Assistive Devices/Equipment: Wheelchair ADL Screening (condition at time of admission) Patient's cognitive ability adequate to safely complete daily activities?: Yes Is the patient deaf or have difficulty hearing?: No Does the patient have difficulty seeing, even when wearing glasses/contacts?: Yes Does the patient have difficulty concentrating, remembering, or making decisions?: No Patient able to express need for assistance with ADLs?: Yes Does the patient have difficulty dressing or bathing?: Yes Independently performs ADLs?: No Communication: Independent Dressing (OT): Needs assistance Is this a change from baseline?: Pre-admission baseline Grooming: Needs assistance Is this a change from baseline?: Pre-admission baseline Feeding: Needs assistance Is this a change from baseline?: Pre-admission baseline Bathing: Needs assistance Is this a change from baseline?: Pre-admission baseline Toileting: Needs assistance Is this a change from baseline?: Pre-admission baseline In/Out Bed: Needs assistance Is this a change from baseline?: Pre-admission baseline Walks in  Home: Needs assistance Is this a change from baseline?: Pre-admission baseline Does the patient have difficulty walking or climbing stairs?: Yes Weakness of Legs: Both Weakness of  Arms/Hands: Both  Permission Sought/Granted Permission sought to share information with : Case Manager Permission granted to share information with : Yes, Verbal Permission Granted     Permission granted to share info w AGENCY: [place] granted to share info w Relationship: [place], sister     Emotional Assessment Appearance:: Appears stated age Attitude/Demeanor/Rapport: Gracious Affect (typically observed): Accepting Orientation: : Oriented to Self, Oriented to Place, Oriented to  Time, Oriented to Situation Alcohol / Substance Use: Not Applicable Psych Involvement: No (comment)  Admission diagnosis:  S/P AKA (above knee amputation), right [facility]) [Z89.611] Patient Active Problem List   Diagnosis Date Noted  . S/P AKA (above knee amputation), right ([place]) 11/17/2019  . Infection of amputation stump, left lower extremity ([place]) 10/19/2019  . PAD (peripheral artery disease) ([place]) 09/30/2019  . Gangrene of foot ([place]) 08/30/2019  . Other fatigue 02/28/2019  . Finger pain, left 02/03/2019  . Trigger ring finger of left hand 02/03/2019  . Osteomyelitis, unspecified ([place]) 11/16/2018  . Diabetic retinopathy associated with type 2 diabetes mellitus ([place]) 11/15/2018  . Rheumatoid factor positive 11/15/2018  . Anemia in ESRD (end-stage renal disease) ([place]) 05/06/2018  . Asthma 05/06/2018  . Hx of adenomatous colonic polyps 04/07/2018  . Status post carpal tunnel release 12/10/2017  . Carpal tunnel syndrome, left upper limb 11/10/2017  . Carpal tunnel syndrome, right upper limb 11/10/2017  . Bilateral hand numbness 09/28/2017  . Dependence on renal dialysis ([place]) 09/11/2017  . Abdominal discomfort, epigastric   . Hypertension 07/06/2017  . Chronic combined systolic and diastolic CHF (congestive heart failure) ([place]) 07/06/2017  . Complicated migraine 05/17/2535  . ESRD on dialysis ([place]) 06/04/2017  . Blindness of both eyes 06/04/2017  . Glaucoma 06/04/2017  . Essential  hypertension 10/27/2016  . GERD (gastroesophageal reflux disease) 10/27/2016  . Hypercalcemia 05/28/2015  . Abnormal stress test 03/15/2015  . Poor venous access 02/08/2015  . Diarrhea, unspecified 11/02/2014  . Fever, unspecified 11/02/2014  . Pain, unspecified 11/02/2014  . Pruritus, unspecified 11/02/2014  . Shortness of breath 07/28/2014  . Acquired absence of eye 03/31/2014  . Coagulation defect, unspecified ([place]) 03/22/2014  . Dysuria 02/14/2014  . Encounter for immunization 01/03/2014  . Iron deficiency anemia, unspecified 10/18/2013  . Unspecified protein-calorie malnutrition ([place]) 09/23/2013  . Complication of vascular dialysis catheter 06/21/2013  . Fibromyalgia 06/21/2013  . Personal history of breast cancer 06/21/2013  . Personal history of other diseases of the musculoskeletal system and connective tissue 06/21/2013  . Type 2 diabetes mellitus with diabetic polyneuropathy ([place]) 06/21/2013  . Secondary hyperparathyroidism of renal origin ([place]) 06/20/2013  . Unspecified complication of cardiac and vascular prosthetic device, implant and graft, subsequent encounter 06/20/2013  . Type II diabetes mellitus with renal manifestations ([place]) 06/15/2013  . Neuropathy 12/31/2011  . Breast cancer of upper-inner quadrant of left female breast ([place]) 06/27/2011   PCP:  Prince Solian, MD Pharmacy:   [facility] [place], [place] [address] [place] [address] Phone: [phone] Fax: [phone]     Social Determinants of Health (SDOH) Interventions    Readmission Risk Interventions Readmission Risk Prevention Plan 11/18/2019 10/24/2019 10/21/2019  Transportation Screening Complete - Complete  PCP or Specialist Appt within 3-5 Days - - Complete  HRI or Home Care Consult - -  Complete  Social Work Consult for [place] Planning/Counseling - - Complete  Palliative Care Screening - -  Complete  Medication Review Press photographer) Complete - Complete  PCP or Specialist appointment within 3-5 days of discharge Complete Complete -  [place] or Home Care Consult Complete Complete -  SW Recovery Care/Counseling Consult Complete Complete -  Palliative Care Screening Complete Not Applicable -  [place] Complete Patient Refused -  Some recent data might be hidden

## 2019-11-18 NOTE — Evaluation (Signed)
Occupational Therapy Evaluation Patient Details Name: [name] MRN: [id number] DOB: [birth year] Today's Date: 11/18/2019    History of Present Illness This 69 y.o. female admited for Rt AKA due to non healing foot ulcer.  PMH includes:  recent Lt AKA, DM, PAD, ESRD on HD, and glaucoma leading to blindness.     Clinical Impression   Pt admitted with above. She demonstrates the below listed deficits and will benefit from continued OT to maximize safety and independence with BADLs.  Pt seen in conjunction with PT and seen for limited eval due to pt with significant confusion this date.  She currently requires min - total A for ADLs.  She lives with her sister with family providing assist with ADLs and functional mobility.  Feel she would benefit from post acute rehab at discharge, but anticipate she and family will refuse as they did last admission. Will follow acutely.       Follow Up Recommendations  Home health OT;SNF;Supervision/Assistance - 24 hour(Pt refused SNF last admission )    Equipment Recommendations  None recommended by OT    Recommendations for Other Services       Precautions / Restrictions Precautions Precautions: Fall Precaution Comments: significant confusion at time of eval       Mobility Bed Mobility               General bed mobility comments: Pt refused attempts   Transfers                 General transfer comment: Pt adamantly refused attempts to move to EOB     Balance                                           ADL either performed or assessed with clinical judgement   ADL Overall ADL's : Needs assistance/impaired Eating/Feeding: Minimal assistance;Bed level   Grooming: Wash/dry hands;Wash/dry face;Set up;Supervision/safety;Bed level   Upper Body Bathing: Maximal assistance;Bed level   Lower Body Bathing: Total assistance;Bed level   Upper Body Dressing : Total assistance;Bed level   Lower Body Dressing:  Total assistance;Bed level   Toilet Transfer: Total assistance Toilet Transfer Details (indicate cue type and reason): unable  Toileting- Clothing Manipulation and Hygiene: Total assistance;Bed level               Vision Baseline Vision/History: Glaucoma Additional Comments: Pt is fully blind      Perception     Praxis Praxis Praxis tested?: Within functional limits    Pertinent Vitals/Pain Pain Assessment: Faces Faces Pain Scale: Hurts even more Pain Location: Rt residual limb  Pain Descriptors / Indicators: Operative site guarding Pain Intervention(s): Monitored during session;Repositioned     Hand Dominance Right   Extremity/Trunk Assessment Upper Extremity Assessment Upper Extremity Assessment: Generalized weakness   Lower Extremity Assessment Lower Extremity Assessment: Defer to PT evaluation       Communication Communication Communication: No difficulties   Cognition Arousal/Alertness: Awake/alert Behavior During Therapy: Anxious;Impulsive(irritable ) Overall Cognitive Status: Impaired/Different from baseline Area of Impairment: Orientation;Attention;Memory;Following commands;Safety/judgement;Problem solving                 Orientation Level: Disoriented to;Time Current Attention Level: Focused;Sustained Memory: Decreased short-term memory Following Commands: Follows one step commands inconsistently       General Comments: Pt confused yelling out for her sister.  She initially would not allow  anyone to interact with her, and didn't believe staff with attempts at reorientation.  Sister arrived during OT/PT eval, and was able to redirect pt    General Comments  Rt LE dressing saturated. pillow linens changed and pillow repositioned.  RN notified     Exercises     Shoulder Instructions      Home Living Family/patient expects to be discharged to:: Private residence Living Arrangements: Other relatives;Children;Other (Comment)(sister )    Type of Home: House Home Access: Ramped entrance     Home Layout: One level           Bathroom Accessibility: No   Home Equipment: Walker - 2 wheels;Wheelchair - [employer];Hospital bed   Additional Comments: family transports pt to and from HD       Prior Functioning/Environment Level of Independence: Needs assistance        Comments: Pt's sister and daughter assist her with bathing and dressing.  Pt uses a sectioned plate for self feeding.  Requires min A for toilet tranfers.  sons lift her into and out of car         OT Problem List: Decreased strength;Decreased activity tolerance;Impaired balance (sitting and/or standing);Decreased cognition;Decreased safety awareness;Decreased knowledge of use of DME or AE;Pain      OT Treatment/Interventions: Self-care/ADL training;DME and/or AE instruction;Therapeutic activities;Cognitive remediation/compensation;Patient/family education;Balance training;Therapeutic exercise    OT Goals(Current goals can be found in the care plan section) Acute Rehab OT Goals Patient Stated Goal: Pt did not state  OT Goal Formulation: With patient/family Time For Goal Achievement: 12/02/19 Potential to Achieve Goals: Fair ADL Goals Pt Will Perform Grooming: (P) with set-up;sitting Pt Will Perform Upper Body Bathing: (P) with supervision;sitting Pt Will Perform Lower Body Bathing: (P) with mod assist;bed level Pt Will Transfer to Toilet: (P) with mod assist;anterior/posterior transfer;bedside commode Pt Will Perform Toileting - Clothing Manipulation and hygiene: (P) with mod assist;sitting/lateral leans  OT Frequency: Min 2X/week   Barriers to D/C:            Co-evaluation PT/OT/SLP Co-Evaluation/Treatment: Yes   PT goals addressed during session: Mobility/safety with mobility OT goals addressed during session: ADL's and self-care;Strengthening/ROM      AM-PAC OT "6 Clicks" Daily Activity     Outcome Measure Help from  another person eating meals?: A Little Help from another person taking care of personal grooming?: A Little Help from another person toileting, which includes using toliet, bedpan, or urinal?: Total Help from another person bathing (including washing, rinsing, drying)?: A Lot Help from another person to put on and taking off regular upper body clothing?: Total Help from another person to put on and taking off regular lower body clothing?: Total 6 Click Score: 11   End of Session Nurse Communication: Mobility status  Activity Tolerance: Other (comment);Treatment limited secondary to agitation(impaired cognition ) Patient left: in bed;with call bell/phone within reach;with family/visitor present;with nursing/sitter in room  OT Visit Diagnosis: Pain;Cognitive communication deficit (R41.841) Pain - Right/Left: Right Pain - part of body: Leg                Time: 0626-9485 OT Time Calculation (min): 26 min Charges:  OT General Charges $OT Visit: 1 Visit OT Evaluation $OT Eval Moderate Complexity: 1 Mod  Nilsa Nutting., OTR/L Acute Rehabilitation Services Pager [phone] Office [place], [place] 11/18/2019, 3:03 PM

## 2019-11-18 NOTE — Procedures (Signed)
Patient seen on Hemodialysis. POD#1 s/p Right AKA for non-healing heel ulcer.  BP (!) 142/49 (BP Location: Left Arm)   Pulse 84   Temp 98.6 F (37 C) (Oral)   Resp 18   Ht 5\' 5"  (1.651 m)   Wt 76.7 kg   LMP  (LMP Unknown)   SpO2 91%   BMI 28.14 kg/m   QB 400, UF goal 2L Tolerating treatment without complaints at this time.   [name] MD [facility]. Office # [phone] Pager # [phone] 10:33 AM

## 2019-11-18 NOTE — Progress Notes (Signed)
PROGRESS NOTE    [name]  QZR:[id number] DOB: 07/11/1951 DOA: 11/17/2019 PCP: Prince Solian, MD   Brief Narrative:  [name] is a 69 y.o. 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 Admitted under hospital service.  Assessment & Plan:   Principal Problem:   S/P AKA (above knee amputation), right (HCC) Active Problems:   Type II diabetes mellitus with renal manifestations (HCC)   Essential hypertension   ESRD on dialysis ([place])   Blindness of both eyes   Chronic combined systolic and diastolic CHF (congestive heart failure) (HCC)   Anemia in ESRD (end-stage renal disease) ([place])   Asthma   PAD (peripheral artery disease) (HCC)   PAD/nonhealing right foot ulcer s/p right AKA: Pain very well controlled.  She is on Dilaudid.  Continue current pain medications.  Management per vascular surgery.  Appreciate their help.  ESRD on MWF HD: Nephrology on board.  She is getting dialysis now.  Appreciate nephrology help.  Essential HTN: Blood pressure on the lower side.  We will continue Coreg but discontinue lisinopril for now.  DM type II: Hemoglobin A1c 6.0 on 10/19/2019. -Good control on SSI.  Continue that.  Bilateral blindness -Will need additional nursing assistance while hospitalized -Continue Atropine and Prednisolone drops  Asthma: Stable. -PRN albuterol HFA  Obesity Body mass index is 31.62 kg/m. -Weight loss should be encouraged, but will be difficult given mobility limitations  DVT prophylaxis: Heparin   Code Status: Full Code  Family Communication:  None present at bedside.  Plan of care discussed with patient in length and he verbalized understanding and agreed with it. Patient is from: Home Disposition Plan: Home versus CIR Barriers to discharge: PT assessment  Status is: Inpatient  Remains inpatient appropriate  because:Inpatient level of care appropriate due to severity of illness   Dispo: The patient is from: Home              Anticipated d/c is to: Home              Anticipated d/c date is: 1 day              Patient currently is not medically stable to d/c.         Estimated body mass index is 28.14 kg/m as calculated from the following:   Height as of this encounter: 5\' 5"  (1.651 m).   Weight as of this encounter: 76.7 kg.  Pressure Injury 09/30/19 Heel Right Unstageable - Full thickness tissue loss in which the base of the injury is covered by slough (yellow, tan, gray, green or brown) and/or eschar (tan, brown or black) in the wound bed. black tender to touch small open r (Active)  09/30/19 2153  Location: Heel  Location Orientation: Right  Staging: Unstageable - Full thickness tissue loss in which the base of the injury is covered by slough (yellow, tan, gray, green or brown) and/or eschar (tan, brown or black) in the wound bed.  Wound Description (Comments): black tender to touch small open redden area  Present on Admission: Yes     Nutritional status:               Consultants:   Vascular surgery  Procedures:   Right BKA  Antimicrobials:  Anti-infectives (From admission, onward)   Start     Dose/Rate Route Frequency Ordered Stop  11/17/19 2200  ceFAZolin (ANCEF) IVPB 1 g/50 mL premix     1 g 100 mL/hr over 30 Minutes Intravenous Every 12 hours 11/17/19 1553 11/18/19 2159   11/17/19 0629  ceFAZolin (ANCEF) IVPB 2g/100 mL premix     2 g 200 mL/hr over 30 Minutes Intravenous 30 min pre-op 11/17/19 0998 11/17/19 0824         Subjective: Patient seen and examined in dialysis unit.  Pain well controlled.  No other complaint.  Objective: Vitals:   11/18/19 1200 11/18/19 1230 11/18/19 1300 11/18/19 1345  BP: 138/62 (!) 141/43 (!) 96/59 (!) 107/50  Pulse: 85 85 88 90  Resp:    18  Temp:    99.1 F (37.3 C)  TempSrc:    Oral  SpO2:    98%  Weight:       Height:        Intake/Output Summary (Last 24 hours) at 11/18/2019 1401 Last data filed at 11/18/2019 0600 Gross per 24 hour  Intake 460.06 ml  Output --  Net 460.06 ml   Filed Weights   11/17/19 0713 11/18/19 0855  Weight: 86.2 kg 76.7 kg    Examination:  General exam: Appears calm and comfortable, legally blind Respiratory system: Clear to auscultation. Respiratory effort normal. Cardiovascular system: S1 & S2 heard, RRR. No JVD, murmurs, rubs, gallops or clicks. No pedal edema. Gastrointestinal system: Abdomen is nondistended, soft and nontender. No organomegaly or masses felt. Normal bowel sounds heard. Central nervous system: Alert and oriented. No focal neurological deficits. Extremities: Bilateral BKA Skin: No rashes, lesions or ulcers Psychiatry: Judgement and insight appear normal. Mood & affect appropriate.    Data Reviewed: I have personally reviewed following labs and imaging studies  CBC: Recent Labs  Lab 11/17/19 0626 11/17/19 0735 11/18/19 0259  WBC 8.0  --  9.6  HGB 8.8* 10.9* 7.6*  HCT 28.3* 32.0* 24.1*  MCV 97.9  --  96.8  PLT 262  --  338   Basic Metabolic Panel: Recent Labs  Lab 11/17/19 0626 11/17/19 0735 11/18/19 0259  NA 140 140 140  K 3.7 3.7 5.0  CL 98 96* 97*  CO2 29  --  26  GLUCOSE 99 68* 109*  BUN 29* 28* 37*  CREATININE 5.79* 5.70* 7.02*  CALCIUM 8.5*  --  8.6*   GFR: Estimated Creatinine Clearance: 8.1 mL/min (A) (by C-G formula based on SCr of 7.02 mg/dL (H)). Liver Function Tests: Recent Labs  Lab 11/17/19 0626  AST 43*  ALT 35  ALKPHOS 211*  BILITOT 0.8  PROT 6.5  ALBUMIN 2.7*   No results for input(s): LIPASE, AMYLASE in the last 168 hours. No results for input(s): AMMONIA in the last 168 hours. Coagulation Profile: Recent Labs  Lab 11/17/19 0626  INR 1.3*   Cardiac Enzymes: No results for input(s): CKTOTAL, CKMB, CKMBINDEX, TROPONINI in the last 168 hours. BNP (last 3 results) No results for  input(s): PROBNP in the last 8760 hours. HbA1C: No results for input(s): HGBA1C in the last 72 hours. CBG: Recent Labs  Lab 11/17/19 0710 11/17/19 0924 11/17/19 1548 11/17/19 1826 11/18/19 0825  GLUCAP 82 86 211* 178* 145*   Lipid Profile: No results for input(s): CHOL, HDL, LDLCALC, TRIG, CHOLHDL, LDLDIRECT in the last 72 hours. Thyroid Function Tests: No results for input(s): TSH, T4TOTAL, FREET4, T3FREE, THYROIDAB in the last 72 hours. Anemia Panel: No results for input(s): VITAMINB12, FOLATE, FERRITIN, TIBC, IRON, RETICCTPCT in the last 72 hours. Sepsis Labs: No  results for input(s): PROCALCITON, LATICACIDVEN in the last 168 hours.  Recent Results (from the past 240 hour(s))  SARS CORONAVIRUS 2 (TAT 6-24 HRS) Nasopharyngeal Nasopharyngeal Swab     Status: None   Collection Time: 11/15/19  3:05 PM   Specimen: Nasopharyngeal Swab  Result Value Ref Range Status   SARS Coronavirus 2 NEGATIVE NEGATIVE Final    Comment: (NOTE) SARS-CoV-2 target nucleic acids are NOT DETECTED. The SARS-CoV-2 RNA is generally detectable in upper and lower respiratory specimens during the acute phase of infection. Negative results do not preclude SARS-CoV-2 infection, do not rule out co-infections with other pathogens, and should not be used as the sole basis for treatment or other patient management decisions. Negative results must be combined with clinical observations, patient history, and epidemiological information. The expected result is Negative. Fact Sheet for Patients: SugarRoll.be Fact Sheet for Healthcare Providers: https://www.woods-mathews.com/ This test is not yet approved or cleared by the [place] FDA and  has been authorized for detection and/or diagnosis of SARS-CoV-2 by FDA under an Emergency Use Authorization (EUA). This EUA will remain  in effect (meaning this test can be used) for the duration of the COVID-19 declaration under  Section 56 4(b)(1) of the Act, 21 U.S.C. section 360bbb-3(b)(1), unless the authorization is terminated or revoked sooner. Performed at [facility], [place] [address], [place], [place] [address]   Surgical pcr screen     Status: None   Collection Time: 11/17/19  6:32 AM   Specimen: Nasal Mucosa; Nasal Swab  Result Value Ref Range Status   MRSA, PCR NEGATIVE NEGATIVE Final   Staphylococcus aureus NEGATIVE NEGATIVE Final    Comment: (NOTE) The Xpert SA Assay (FDA approved for NASAL specimens in patients 57 years of age and older), is one component of a comprehensive surveillance program. It is not intended to diagnose infection nor to guide or monitor treatment. Performed at [facility], [place] [address], [place], [place] [address]       Radiology Studies: No results found.  Scheduled Meds: . aspirin EC  81 mg Oral Daily  . atropine  1 drop Right Eye BID  . carvedilol  12.5 mg Oral BID  . Chlorhexidine Gluconate Cloth  6 each Topical Once   And  . Chlorhexidine Gluconate Cloth  6 each Topical Once  . Chlorhexidine Gluconate Cloth  6 each Topical Q0600  . diphenhydrAMINE  25 mg Oral TID  . doxercalciferol  1 mcg Intravenous Q M,W,F-HD  . DULoxetine  30 mg Oral Daily  . feeding supplement (PRO-STAT SUGAR FREE 64)  30 mL Oral BID  . gabapentin  100 mg Oral BID  . heparin  5,000 Units Subcutaneous Q8H  . insulin aspart  0-6 Units Subcutaneous TID WC  . lanthanum  4,000 mg Oral TID WC   And  . lanthanum  2,000 mg Oral With snacks  . pantoprazole  40 mg Oral BID  . [START ON 11/19/2019] pneumococcal 23 valent vaccine  0.5 mL Intramuscular Tomorrow-1000  . prednisoLONE acetate  1 drop Right Eye BID  . traMADol  25 mg Oral BID   Continuous Infusions: .  ceFAZolin (ANCEF) IV 1 g (11/17/19 2201)  . magnesium sulfate bolus IVPB       LOS: 1 day   Time spent: 32 minutes   Darliss Cheney, MD [facility]  11/18/2019, 2:01 PM   To contact the attending  provider between 7A-7P or the covering provider during after hours 7P-7A, please log  into the web site [email or web address].

## 2019-11-18 NOTE — Progress Notes (Signed)
Inpatient Rehabilitation Admissions Coordinator  Inpatient rehab consult received. I await therapy evals but patient likely to d/c directly home with family support. I will follow per PV navigator.  Danne Baxter, RN, MSN Rehab Admissions Coordinator [phone] 11/18/2019 1:18 PM

## 2019-11-18 NOTE — Evaluation (Signed)
Physical Therapy Evaluation Patient Details Name: [name] MRN: [id number] DOB: 11/13/1950 Today's Date: 11/18/2019   History of Present Illness  This 69 y.o. female admited for Rt AKA due to non healing foot ulcer.  PMH includes:  recent Lt AKA, DM, PAD, ESRD on HD, and glaucoma leading to blindness.    Clinical Impression  Patient presents with confusion, pain, decreased activity tolerance, decreased AROM Bil residual limbs and impaired mobility s/p above. Pt lives with sister and gets assist with ADLs/transfers PTA. Uses w/c for mobility. Limited evaluation performed in conjunction with OT due to significant confusion. Pt tolerated some therex of residual limbs and repositioning of pillow. Would greatly benefit from post acute rehab however per notes, family wants to take pt home and resume [place] services. Recommend maximizing HH services. Will follow acutely.     Follow Up Recommendations Home health PT;Supervision for mobility/OOB;Supervision/Assistance - 24 hour    Equipment Recommendations  None recommended by PT    Recommendations for Other Services       Precautions / Restrictions Precautions Precautions: Fall Precaution Comments: significant confusion at time of eval  Restrictions Weight Bearing Restrictions: No      Mobility  Bed Mobility               General bed mobility comments: Pt refused attempts   Transfers                 General transfer comment: Pt adamantly refused attempts to move to EOB   Ambulation/Gait                Stairs            Wheelchair Mobility    Modified Rankin (Stroke Patients Only)       Balance                                             Pertinent Vitals/Pain Pain Assessment: Faces Faces Pain Scale: Hurts even more Pain Location: Rt residual limb  Pain Descriptors / Indicators: Operative site guarding Pain Intervention(s): Monitored during session;Repositioned    Home  Living Family/patient expects to be discharged to:: Private residence Living Arrangements: Other relatives;Children;Other (Comment)(sister) Available Help at Discharge: Family;Available PRN/intermittently Type of Home: House Home Access: Ramped entrance   Entrance Stairs-Number of Steps: 5 Home Layout: One level Home Equipment: [place] - 2 wheels;Wheelchair - [employer];Hospital bed Additional Comments: family transports pt to and from HD     Prior Function Level of Independence: Needs assistance   Gait / Transfers Assistance Needed: Assist with transfers; able to propel w/c short distances in home.     Comments: Pt's sister and daughter assist her with bathing and dressing.  Pt uses a sectioned plate for self feeding.  Requires min A for toilet tranfers.  sons lift her into and out of car      Hand Dominance   Dominant Hand: Right    Extremity/Trunk Assessment   Upper Extremity Assessment Upper Extremity Assessment: Defer to OT evaluation    Lower Extremity Assessment Lower Extremity Assessment: Generalized weakness;RLE deficits/detail;LLE deficits/detail RLE Deficits / Details: Limited movement post surgically. Painful to touch RLE Sensation: WNL LLE Deficits / Details: Limited hip flexion, hip abduction/adduction and extension. But performing actively.       Communication   Communication: No difficulties  Cognition Arousal/Alertness: Awake/alert Behavior During Therapy:  Anxious;Impulsive(irritable) Overall Cognitive Status: Impaired/Different from baseline Area of Impairment: Orientation;Attention;Memory;Following commands;Safety/judgement;Problem solving                 Orientation Level: Disoriented to;Time Current Attention Level: Focused;Sustained Memory: Decreased short-term memory Following Commands: Follows one step commands inconsistently       General Comments: Pt confused yelling out for her sister.  She initially would not allow  anyone to interact with her, and didn't believe staff with attempts at reorientation.  Sister arrived during OT/PT eval, and was able to redirect pt. "I don't trust y'all."      General Comments General comments (skin integrity, edema, etc.): RLE dressing saturated; linens changed and repositioned. RN notified.    Exercises Amputee Exercises Quad Sets: Both;5 reps;Supine Hip ABduction/ADduction: Left;Supine(x3) Hip Flexion/Marching: Left;Supine(x2)   Assessment/Plan    PT Assessment Patient needs continued PT services  PT Problem List Decreased strength;Decreased mobility;Decreased safety awareness;Obesity;Decreased range of motion;Pain;Decreased skin integrity;Decreased cognition       PT Treatment Interventions Therapeutic activities;Patient/family education;Wheelchair mobility training;Functional mobility training;Balance training;Therapeutic exercise;DME instruction    PT Goals (Current goals can be found in the Care Plan section)  Acute Rehab PT Goals Patient Stated Goal: "get my sister" PT Goal Formulation: With patient Time For Goal Achievement: 12/02/19 Potential to Achieve Goals: Fair    Frequency Min 3X/week   Barriers to discharge        Co-evaluation PT/OT/SLP Co-Evaluation/Treatment: Yes Reason for Co-Treatment: Necessary to address cognition/behavior during functional activity;For patient/therapist safety PT goals addressed during session: Mobility/safety with mobility OT goals addressed during session: ADL's and self-care;Strengthening/ROM       AM-PAC PT "6 Clicks" Mobility  Outcome Measure Help needed turning from your back to your side while in a flat bed without using bedrails?: Total Help needed moving from lying on your back to sitting on the side of a flat bed without using bedrails?: Total Help needed moving to and from a bed to a chair (including a wheelchair)?: Total Help needed standing up from a chair using your arms (e.g., wheelchair or  bedside chair)?: Total Help needed to walk in hospital room?: Total Help needed climbing 3-5 steps with a railing? : Total 6 Click Score: 6    End of Session   Activity Tolerance: Patient limited by pain;Other (comment)(confusion) Patient left: in bed;with call bell/phone within reach;with bed alarm set;with family/visitor present Nurse Communication: Mobility status;Need for lift equipment PT Visit Diagnosis: Pain;Muscle weakness (generalized) (M62.81) Pain - Right/Left: Right Pain - part of body: Leg    Time: 7628-3151 PT Time Calculation (min) (ACUTE ONLY): 26 min   Charges:   PT Evaluation $PT Eval Moderate Complexity: 1 Mod          Marisa Severin, PT, DPT Acute Rehabilitation Services Pager [phone] Office [phone]      Marguarite Arbour A Sabra Heck 11/18/2019, 4:13 PM

## 2019-11-18 NOTE — Consult Note (Signed)
PV Navigator consult received and chart reviewed. Continuing to follow this patient from her last admission.  Patient currently in dialysis, however I was able to speak with her sister [name] with patient's permission. Sister reports pain is doing much better post surgery. She says she believes the leg was the source of her pain and is glad that now all she has to deal with is the healing aspect of the leg. Sister reports plan for discharge is anticipated for Sunday for her to go home with help from herself and son. Family is very supportive.  She says they have all the equipment in place for her to return home. She also reports that her family has been responsible for all her transportation to dialysis and doctor appointments. She states she would be interested in getting set back up with [facility] for transportation if at all possible due to they are "more equipped" for patients with wheelchairs. She also reports that [facility] home health is scheduled to resume visits once she returns home for wound care. She denies any other questions or barriers at this time.  Contact information once again relayed in the event of questions or issues.   Thank you.  Cletis Media RN BSN CWS [place] [phone]

## 2019-11-19 ENCOUNTER — Other Ambulatory Visit: Payer: Self-pay | Admitting: Vascular Surgery

## 2019-11-19 DIAGNOSIS — N186 End stage renal disease: Secondary | ICD-10-CM | POA: Diagnosis not present

## 2019-11-19 DIAGNOSIS — Z992 Dependence on renal dialysis: Secondary | ICD-10-CM | POA: Diagnosis not present

## 2019-11-19 DIAGNOSIS — E1129 Type 2 diabetes mellitus with other diabetic kidney complication: Secondary | ICD-10-CM | POA: Diagnosis not present

## 2019-11-19 LAB — BASIC METABOLIC PANEL
Anion gap: 12 (ref 5–15)
BUN: 16 mg/dL (ref 8–23)
CO2: 28 mmol/L (ref 22–32)
Calcium: 8.5 mg/dL — ABNORMAL LOW (ref 8.9–10.3)
Chloride: 96 mmol/L — ABNORMAL LOW (ref 98–111)
Creatinine, Ser: 4.42 mg/dL — ABNORMAL HIGH (ref 0.44–1.00)
GFR calc Af Amer: 11 mL/min — ABNORMAL LOW (ref >60)
GFR calc non Af Amer: 10 mL/min — ABNORMAL LOW (ref >60)
Glucose, Bld: 129 mg/dL — ABNORMAL HIGH (ref 70–99)
Potassium: 3.9 mmol/L (ref 3.5–5.1)
Sodium: 136 mmol/L (ref 135–145)

## 2019-11-19 LAB — CBC
HCT: 23.2 % — ABNORMAL LOW (ref 36.0–46.0)
Hemoglobin: 7.2 g/dL — ABNORMAL LOW (ref 12.0–15.0)
MCH: 30.5 pg (ref 26.0–34.0)
MCHC: 31 g/dL (ref 30.0–36.0)
MCV: 98.3 fL (ref 80.0–100.0)
Platelets: 236 10*3/uL (ref 150–400)
RBC: 2.36 MIL/uL — ABNORMAL LOW (ref 3.87–5.11)
RDW: 20.1 % — ABNORMAL HIGH (ref 11.5–15.5)
WBC: 9.4 10*3/uL (ref 4.0–10.5)
nRBC: 0 % (ref 0.0–0.2)

## 2019-11-19 LAB — GLUCOSE, CAPILLARY
Glucose-Capillary: 135 mg/dL — ABNORMAL HIGH (ref 70–99)
Glucose-Capillary: 161 mg/dL — ABNORMAL HIGH (ref 70–99)
Glucose-Capillary: 164 mg/dL — ABNORMAL HIGH (ref 70–99)

## 2019-11-19 MED ORDER — DARBEPOETIN ALFA 100 MCG/0.5ML IJ SOSY
100.0000 ug | PREFILLED_SYRINGE | INTRAMUSCULAR | Status: DC
  Administered 2019-11-21: 100 ug via INTRAVENOUS
  Filled 2019-11-19: qty 0.5

## 2019-11-19 NOTE — Plan of Care (Signed)

## 2019-11-19 NOTE — Progress Notes (Signed)
[facility]  Subjective  -no complaints.   Objective (!) 110/35 82 97.7 F (36.5 C) (Oral) 16 96%  Intake/Output Summary (Last 24 hours) at 11/19/2019 1020 Last data filed at 11/18/2019 1311 Gross per 24 hour  Intake -  Output 1450 ml  Net -1450 ml    Right AKA with staples in one area of bloody drainage  Laboratory Lab Results: Recent Labs    11/18/19 0259 11/19/19 0333  WBC 9.6 9.4  HGB 7.6* 7.2*  HCT 24.1* 23.2*  PLT 309 236   BMET Recent Labs    11/18/19 0259 11/19/19 0333  NA 140 136  K 5.0 3.9  CL 97* 96*  CO2 26 28  GLUCOSE 109* 129*  BUN 37* 16  CREATININE 7.02* 4.42*  CALCIUM 8.6* 8.5*    COAG Lab Results  Component Value Date   INR 1.3 (H) 11/17/2019   INR 1.16 05/07/2018   INR 1.13 02/06/2018   No results found for: PTT  Assessment/Planning:  Status post right AKA with Dr. Donnetta Hutching.  Dressing change at bedside this morning and overall looks very good.  There is one area where there is some sanguinous drainage between the staple lines.  Discussed with her and her sister that we can just change this dressing accordingly with slight pressure wrap from ace and hopefully this will stop with time.  Hemoglobin 7.6 to 7 2.  [name] 11/19/2019 10:20 AM --

## 2019-11-19 NOTE — Progress Notes (Addendum)
PROGRESS NOTE    [name]  IHW:[id number] DOB: [birth year] DOA: 11/17/2019 PCP: Prince Solian, MD   Brief Narrative:  [name] is a 69 y.o. 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 Admitted under hospital service.  Assessment & Plan:   Principal Problem:   S/P AKA (above knee amputation), right (HCC) Active Problems:   Type II diabetes mellitus with renal manifestations (HCC)   Essential hypertension   ESRD on dialysis ([place])   Blindness of both eyes   Chronic combined systolic and diastolic CHF (congestive heart failure) (HCC)   Anemia in ESRD (end-stage renal disease) ([place])   Asthma   PAD (peripheral artery disease) ([place])   PAD/nonhealing right foot ulcer s/p right AKA: Pain controlled.  She is on Dilaudid.  Continue current pain medications.  Per sister at the bedside, patient had significant amount of bleeding when her dressing was changed by Dr. Carlis Abbott from vascular surgery and that he has recommended to keep her here overnight.  Management per vascular surgery.  Appreciate their help.  Acute blood loss anemia: Dropped from 8.8 preoperatively to 7.2 postoperatively.  No indication of transfusion.  Repeat in the morning.  Transfuse if drops less than 7.  ESRD on MWF HD: Nephrology on board.  She is getting dialysis now.  Appreciate nephrology help.  Essential HTN: Blood pressure still on the lower side.  We will continue Coreg but discontinue lisinopril for now.  DM type II: Hemoglobin A1c 6.0 on 10/19/2019. -Good control on SSI.  Continue that.  Bilateral blindness -Will need additional nursing assistance while hospitalized -Continue Atropine and Prednisolone drops  Asthma: Stable. -PRN albuterol HFA  Obesity Body mass index is 31.62 kg/m. -Weight loss should be encouraged, but will be difficult given mobility  limitations  DVT prophylaxis: Heparin   Code Status: Full Code  Family Communication: Sister at the bedside.  Plan of care discussed with her in length and he verbalized understanding and agreed with it. Patient is from: Home Disposition Plan: Home  Barriers to discharge: Persistent bleeding from the stump/clearance from vascular surgery  Status is: Inpatient  Remains inpatient appropriate because:Inpatient level of care appropriate due to severity of illness   Dispo: The patient is from: Home              Anticipated d/c is to: Home              Anticipated d/c date is: 1 day              Patient currently is not medically stable to d/c.         Estimated body mass index is 27.4 kg/m as calculated from the following:   Height as of this encounter: 5\' 5"  (1.651 m).   Weight as of this encounter: 74.7 kg.  Pressure Injury 09/30/19 Heel Right Unstageable - Full thickness tissue loss in which the base of the injury is covered by slough (yellow, tan, gray, green or brown) and/or eschar (tan, brown or black) in the wound bed. black tender to touch small open r (Active)  09/30/19 2153  Location: Heel  Location Orientation: Right  Staging: Unstageable - Full thickness tissue loss in which the base of the injury is covered by slough (yellow, tan, gray, green or brown) and/or eschar (tan, brown or black) in the wound bed.  Wound Description (  Comments): black tender to touch small open redden area  Present on Admission: Yes     Nutritional status:               Consultants:   Vascular surgery  Procedures:   Right BKA  Antimicrobials:  Anti-infectives (From admission, onward)   Start     Dose/Rate Route Frequency Ordered Stop   11/17/19 2200  ceFAZolin (ANCEF) IVPB 1 g/50 mL premix     1 g 100 mL/hr over 30 Minutes Intravenous Every 12 hours 11/17/19 1553 11/18/19 1705   11/17/19 0629  ceFAZolin (ANCEF) IVPB 2g/100 mL premix     2 g 200 mL/hr over 30 Minutes  Intravenous 30 min pre-op 11/17/19 0629 11/17/19 0824         Subjective: Seen and examined.  Sister at the bedside.  Patient has no complaint other than mild pain for which she is going to receive her pain medication soon.  Objective: Vitals:   11/18/19 1311 11/18/19 1345 11/18/19 1936 11/19/19 0509  BP: (!) 115/48 (!) 107/50 (!) 105/55 (!) 110/35  Pulse: 85 90 84 82  Resp: 18 18 16 16   Temp: 98.7 F (37.1 C) 99.1 F (37.3 C) 98.7 F (37.1 C) 97.7 F (36.5 C)  TempSrc: Oral Oral Oral Oral  SpO2: 93% 98% 92% 96%  Weight: 74.7 kg     Height:        Intake/Output Summary (Last 24 hours) at 11/19/2019 1124 Last data filed at 11/19/2019 0900 Gross per 24 hour  Intake 240 ml  Output 1450 ml  Net -1210 ml   Filed Weights   11/17/19 0713 11/18/19 0855 11/18/19 1311  Weight: 86.2 kg 76.7 kg 74.7 kg    Examination:  General exam: Appears calm and comfortable  Respiratory system: Clear to auscultation. Respiratory effort normal. Cardiovascular system: S1 & S2 heard, RRR. No JVD, murmurs, rubs, gallops or clicks. No pedal edema. Gastrointestinal system: Abdomen is nondistended, soft and nontender. No organomegaly or masses felt. Normal bowel sounds heard. Central nervous system: Alert and oriented. No focal neurological deficits. Extremities: Bilateral BKA Skin: No rashes, lesions or ulcers.  Psychiatry: Judgement and insight appear poor. Mood & affect flat.  Data Reviewed: I have personally reviewed following labs and imaging studies  CBC: Recent Labs  Lab 11/17/19 0626 11/17/19 0735 11/18/19 0259 11/19/19 0333  WBC 8.0  --  9.6 9.4  HGB 8.8* 10.9* 7.6* 7.2*  HCT 28.3* 32.0* 24.1* 23.2*  MCV 97.9  --  96.8 98.3  PLT 262  --  309 962   Basic Metabolic Panel: Recent Labs  Lab 11/17/19 0626 11/17/19 0735 11/18/19 0259 11/19/19 0333  NA 140 140 140 136  K 3.7 3.7 5.0 3.9  CL 98 96* 97* 96*  CO2 29  --  26 28  GLUCOSE 99 68* 109* 129*  BUN 29* 28* 37* 16    CREATININE 5.79* 5.70* 7.02* 4.42*  CALCIUM 8.5*  --  8.6* 8.5*   GFR: Estimated Creatinine Clearance: 12.7 mL/min (A) (by C-G formula based on SCr of 4.42 mg/dL (H)). Liver Function Tests: Recent Labs  Lab 11/17/19 0626  AST 43*  ALT 35  ALKPHOS 211*  BILITOT 0.8  PROT 6.5  ALBUMIN 2.7*   No results for input(s): LIPASE, AMYLASE in the last 168 hours. No results for input(s): AMMONIA in the last 168 hours. Coagulation Profile: Recent Labs  Lab 11/17/19 0626  INR 1.3*   Cardiac Enzymes: No results for input(s): CKTOTAL,  CKMB, CKMBINDEX, TROPONINI in the last 168 hours. BNP (last 3 results) No results for input(s): PROBNP in the last 8760 hours. HbA1C: No results for input(s): HGBA1C in the last 72 hours. CBG: Recent Labs  Lab 11/17/19 1826 11/18/19 0825 11/18/19 1658 11/18/19 2058 11/19/19 0729  GLUCAP 178* 145* 154* 131* 135*   Lipid Profile: No results for input(s): CHOL, HDL, LDLCALC, TRIG, CHOLHDL, LDLDIRECT in the last 72 hours. Thyroid Function Tests: No results for input(s): TSH, T4TOTAL, FREET4, T3FREE, THYROIDAB in the last 72 hours. Anemia Panel: No results for input(s): VITAMINB12, FOLATE, FERRITIN, TIBC, IRON, RETICCTPCT in the last 72 hours. Sepsis Labs: No results for input(s): PROCALCITON, LATICACIDVEN in the last 168 hours.  Recent Results (from the past 240 hour(s))  SARS CORONAVIRUS 2 (TAT 6-24 HRS) Nasopharyngeal Nasopharyngeal Swab     Status: None   Collection Time: 11/15/19  3:05 PM   Specimen: Nasopharyngeal Swab  Result Value Ref Range Status   SARS Coronavirus 2 NEGATIVE NEGATIVE Final    Comment: (NOTE) SARS-CoV-2 target nucleic acids are NOT DETECTED. The SARS-CoV-2 RNA is generally detectable in upper and lower respiratory specimens during the acute phase of infection. Negative results do not preclude SARS-CoV-2 infection, do not rule out co-infections with other pathogens, and should not be used as the sole basis for  treatment or other patient management decisions. Negative results must be combined with clinical observations, patient history, and epidemiological information. The expected result is Negative. Fact Sheet for Patients: SugarRoll.be Fact Sheet for Healthcare Providers: https://www.woods-mathews.com/ This test is not yet approved or cleared by the [place] FDA and  has been authorized for detection and/or diagnosis of SARS-CoV-2 by FDA under an Emergency Use Authorization (EUA). This EUA will remain  in effect (meaning this test can be used) for the duration of the COVID-19 declaration under Section 56 4(b)(1) of the Act, 21 U.S.C. section 360bbb-3(b)(1), unless the authorization is terminated or revoked sooner. Performed at [facility], [place] [address], [place], [place] [address]   Surgical pcr screen     Status: None   Collection Time: 11/17/19  6:32 AM   Specimen: Nasal Mucosa; Nasal Swab  Result Value Ref Range Status   MRSA, PCR NEGATIVE NEGATIVE Final   Staphylococcus aureus NEGATIVE NEGATIVE Final    Comment: (NOTE) The Xpert SA Assay (FDA approved for NASAL specimens in patients 87 years of age and older), is one component of a comprehensive surveillance program. It is not intended to diagnose infection nor to guide or monitor treatment. Performed at [facility], [place] [address] [place], [place] [address]       Radiology Studies: No results found.  Scheduled Meds: . aspirin EC  81 mg Oral Daily  . atropine  1 drop Right Eye BID  . carvedilol  12.5 mg Oral BID  . Chlorhexidine Gluconate Cloth  6 each Topical Once   And  . Chlorhexidine Gluconate Cloth  6 each Topical Once  . Chlorhexidine Gluconate Cloth  6 each Topical Q0600  . [START ON 11/21/2019] darbepoetin (ARANESP) injection - DIALYSIS  100 mcg Intravenous Q Mon-HD  . diphenhydrAMINE  25 mg Oral TID  . doxercalciferol  1 mcg Intravenous Q M,W,F-HD  .  DULoxetine  30 mg Oral Daily  . feeding supplement (PRO-STAT SUGAR FREE 64)  30 mL Oral BID  . gabapentin  100 mg Oral BID  . heparin  5,000 Units Subcutaneous Q8H  . insulin aspart  0-6 Units Subcutaneous  TID WC  . lanthanum  4,000 mg Oral TID WC   And  . lanthanum  2,000 mg Oral With snacks  . pantoprazole  40 mg Oral BID  . prednisoLONE acetate  1 drop Right Eye BID  . traMADol  25 mg Oral BID   Continuous Infusions: . magnesium sulfate bolus IVPB       LOS: 2 days   Time spent: 29 minutes   Darliss Cheney, MD [facility]  11/19/2019, 11:24 AM   To contact the attending provider between 7A-7P or the covering provider during after hours 7P-7A, please log into the web site [email or web address].

## 2019-11-19 NOTE — Progress Notes (Addendum)
[facility] Progress Note   Subjective:   Patient seen in room, eating breakfast. Reports stump bled overnight and is very painful this AM- awaiting pain meds. Denies SOB, headache, dizziness, CP, palpitations, abdominal pain, N/V/D. Reports she has not been getting her phosphorus binders with breakfast.   Objective Vitals:   11/18/19 1311 11/18/19 1345 11/18/19 1936 11/19/19 0509  BP: (!) 115/48 (!) 107/50 (!) 105/55 (!) 110/35  Pulse: 85 90 84 82  Resp: 18 18 16 16   Temp: 98.7 F (37.1 C) 99.1 F (37.3 C) 98.7 F (37.1 C) 97.7 F (36.5 C)  TempSrc: Oral Oral Oral Oral  SpO2: 93% 98% 92% 96%  Weight: 74.7 kg     Height:       Physical Exam General: Well developed, well nourished female. Alert and in NAD Heart: RRR, 4/6 systolic murmur Lungs: CTA bilaterally without wheezing, rhonchi or rales Abdomen: Soft, non-tender,non-distended, + BS Extremities: B/l AKA, R AKA bandage with small amount of blood noted. No pitting edema Dialysis Access:  RUE AVF + bruit  Additional Objective Labs: Basic Metabolic Panel: Recent Labs  Lab 11/17/19 0626 11/17/19 0626 11/17/19 0735 11/18/19 0259 11/19/19 0333  NA 140   < > 140 140 136  K 3.7   < > 3.7 5.0 3.9  CL 98   < > 96* 97* 96*  CO2 29  --   --  26 28  GLUCOSE 99   < > 68* 109* 129*  BUN 29*   < > 28* 37* 16  CREATININE 5.79*   < > 5.70* 7.02* 4.42*  CALCIUM 8.5*  --   --  8.6* 8.5*   < > = values in this interval not displayed.   Liver Function Tests: Recent Labs  Lab 11/17/19 0626  AST 43*  ALT 35  ALKPHOS 211*  BILITOT 0.8  PROT 6.5  ALBUMIN 2.7*   CBC: Recent Labs  Lab 11/17/19 0626 11/17/19 0626 11/17/19 0735 11/18/19 0259 11/19/19 0333  WBC 8.0  --   --  9.6 9.4  HGB 8.8*   < > 10.9* 7.6* 7.2*  HCT 28.3*   < > 32.0* 24.1* 23.2*  MCV 97.9  --   --  96.8 98.3  PLT 262  --   --  309 236   < > = values in this interval not displayed.   Blood Culture    Component Value Date/Time   SDES BLOOD LEFT HAND 10/19/2019 1959   SPECREQUEST  10/19/2019 1959    BOTTLES DRAWN AEROBIC ONLY Blood Culture adequate volume   CULT  10/19/2019 1959    NO GROWTH 5 DAYS Performed at [facility], [place] [address] [place], [place] [address]    REPTSTATUS 10/24/2019 FINAL 10/19/2019 1959    CBG: Recent Labs  Lab 11/17/19 1826 11/18/19 0825 11/18/19 1658 11/18/19 2058 11/19/19 0729  GLUCAP 178* 145* 154* 131* 135*   Medications: . magnesium sulfate bolus IVPB     . aspirin EC  81 mg Oral Daily  . atropine  1 drop Right Eye BID  . carvedilol  12.5 mg Oral BID  . Chlorhexidine Gluconate Cloth  6 each Topical Once   And  . Chlorhexidine Gluconate Cloth  6 each Topical Once  . Chlorhexidine Gluconate Cloth  6 each Topical Q0600  . diphenhydrAMINE  25 mg Oral TID  . doxercalciferol  1 mcg Intravenous Q M,W,F-HD  . DULoxetine  30 mg Oral Daily  . feeding supplement (PRO-STAT  SUGAR FREE 64)  30 mL Oral BID  . gabapentin  100 mg Oral BID  . heparin  5,000 Units Subcutaneous Q8H  . insulin aspart  0-6 Units Subcutaneous TID WC  . lanthanum  4,000 mg Oral TID WC   And  . lanthanum  2,000 mg Oral With snacks  . pantoprazole  40 mg Oral BID  . pneumococcal 23 valent vaccine  0.5 mL Intramuscular Tomorrow-1000  . prednisoLONE acetate  1 drop Right Eye BID  . traMADol  25 mg Oral BID    Dialysis Orders: MWF @ [facility] 4hr, 400/A1.5, EDW 81kg, 2K/2.25Ca, AVF, UF profile 2, no heparin - Hectoral 45mcg IV q HD - Mircera 248mcg IV q 2 weeks (last 4/19)  Assessment/Plan: 1.  R heel wound, now s/p R AKA 4/29: Per vascular surgery team. 2.  ESRD:  K+ 3.9. Continue HD per MWF schedule - next Monday 5/3. No heparin. 3.  Hypertension/volume: BP well controlled, euvolemic on exam. Will need new EDW s/p amputation, appears to be approx. 75kg.  4.  Anemia of ESRD: Hgb 7.2 post op. Will order ESA with next dialysis on 5/3. Transfuse PRN.  5.  Metabolic bone disease: Corrected calcium  9.5. Continue hectorol. No phos reported yet and states she has been missing doses of binders during this admission. Will check phos with AM labs tomorrow.  6.  Nutrition: Alb low - continue pro stat supplements.  7.  T2DM: Per primary.  Anice Paganini, PA-C 11/19/2019, 8:36 AM  [facility] Pager: [phone]

## 2019-11-19 NOTE — Progress Notes (Signed)
Physical Therapy Treatment Patient Details Name: [name] MRN: [id number] DOB: [birth year] Today's Date: 11/19/2019    History of Present Illness This 69 y.o. female admited for Rt AKA due to non healing foot ulcer.  PMH includes:  recent Lt AKA, DM, PAD, ESRD on HD, and glaucoma leading to blindness.      PT Comments    Pt supine in bed with sister present.  She followed commands and able to move from bed to recliner with mod +2 assistance.  Plan remains for return home with family.     Follow Up Recommendations  Home health PT;Supervision for mobility/OOB;Supervision/Assistance - 24 hour     Equipment Recommendations  None recommended by PT    Recommendations for Other Services       Precautions / Restrictions Precautions Precautions: Fall Precaution Comments: significant confusion at time of eval  Restrictions Weight Bearing Restrictions: Yes    Mobility  Bed Mobility Overal bed mobility: Needs Assistance Bed Mobility: Supine to Sit     Supine to sit: Mod assist;+2 for physical assistance     General bed mobility comments: Pt held to PTA and techs hand to pull into long sitting.  Once in sitting used UEs to move her hips towards recliner with assistance to scoot using bed pad.  Transfers Overall transfer level: Needs assistance Equipment used: None(bed pad) Transfers: Comptroller transfers: Mod assist;+2 physical assistance   General transfer comment: Pt required boosting with Bed pad to move hips from bed to recliner.  Able to follow commands for hand placement to push toward back of recliner.  Ambulation/Gait                 Stairs             Wheelchair Mobility    Modified Rankin (Stroke Patients Only)       Balance Overall balance assessment: Needs assistance   Sitting balance-Leahy Scale: Fair                                      Cognition Arousal/Alertness:  Awake/alert Behavior During Therapy: WFL for tasks assessed/performed Overall Cognitive Status: Within Functional Limits for tasks assessed                                 General Comments: Sister reports she believed the meds made her confused.  Pt able to follow commands this session and progress mobility with sister present.      Exercises Amputee Exercises Hip Extension: AROM;Both;10 reps;Sidelying    General Comments        Pertinent Vitals/Pain Pain Assessment: Faces Faces Pain Scale: Hurts even more Pain Location: Rt residual limb  Pain Descriptors / Indicators: Operative site guarding Pain Intervention(s): Monitored during session;Repositioned    Home Living                      Prior Function            PT Goals (current goals can now be found in the care plan section) Acute Rehab PT Goals Patient Stated Goal: none stated Potential to Achieve Goals: Fair Progress towards PT goals: Progressing toward goals    Frequency    Min 3X/week      PT Plan Current plan remains appropriate  Co-evaluation              AM-PAC PT "6 Clicks" Mobility   Outcome Measure  Help needed turning from your back to your side while in a flat bed without using bedrails?: Total Help needed moving from lying on your back to sitting on the side of a flat bed without using bedrails?: Total Help needed moving to and from a bed to a chair (including a wheelchair)?: Total Help needed standing up from a chair using your arms (e.g., wheelchair or bedside chair)?: Total Help needed to walk in hospital room?: Total Help needed climbing 3-5 steps with a railing? : Total 6 Click Score: 6    End of Session Equipment Utilized During Treatment: Gait belt Activity Tolerance: Patient tolerated treatment well Patient left: in chair;with call bell/phone within reach;with chair alarm set Nurse Communication: Mobility status;Need for lift equipment PT Visit  Diagnosis: Pain;Muscle weakness (generalized) (M62.81) Pain - Right/Left: Right Pain - part of body: Leg     Time: 5747-3403 PT Time Calculation (min) (ACUTE ONLY): 15 min  Charges:  $Therapeutic Activity: 8-22 mins                     Erasmo Leventhal , PTA Acute Rehabilitation Services Pager [phone] Office [place] 11/19/2019, 1:00 PM

## 2019-11-20 LAB — RENAL FUNCTION PANEL
Albumin: 2.4 g/dL — ABNORMAL LOW (ref 3.5–5.0)
Anion gap: 12 (ref 5–15)
BUN: 35 mg/dL — ABNORMAL HIGH (ref 8–23)
CO2: 28 mmol/L (ref 22–32)
Calcium: 8.6 mg/dL — ABNORMAL LOW (ref 8.9–10.3)
Chloride: 98 mmol/L (ref 98–111)
Creatinine, Ser: 6.21 mg/dL — ABNORMAL HIGH (ref 0.44–1.00)
GFR calc Af Amer: 7 mL/min — ABNORMAL LOW (ref >60)
GFR calc non Af Amer: 6 mL/min — ABNORMAL LOW (ref >60)
Glucose, Bld: 110 mg/dL — ABNORMAL HIGH (ref 70–99)
Phosphorus: 5.7 mg/dL — ABNORMAL HIGH (ref 2.5–4.6)
Potassium: 3.8 mmol/L (ref 3.5–5.1)
Sodium: 138 mmol/L (ref 135–145)

## 2019-11-20 LAB — CBC
HCT: 21.2 % — ABNORMAL LOW (ref 36.0–46.0)
Hemoglobin: 6.7 g/dL — CL (ref 12.0–15.0)
MCH: 30.6 pg (ref 26.0–34.0)
MCHC: 31.6 g/dL (ref 30.0–36.0)
MCV: 96.8 fL (ref 80.0–100.0)
Platelets: 241 10*3/uL (ref 150–400)
RBC: 2.19 MIL/uL — ABNORMAL LOW (ref 3.87–5.11)
RDW: 19.4 % — ABNORMAL HIGH (ref 11.5–15.5)
WBC: 8.3 10*3/uL (ref 4.0–10.5)
nRBC: 0 % (ref 0.0–0.2)

## 2019-11-20 LAB — PREPARE RBC (CROSSMATCH)

## 2019-11-20 LAB — GLUCOSE, CAPILLARY
Glucose-Capillary: 130 mg/dL — ABNORMAL HIGH (ref 70–99)
Glucose-Capillary: 177 mg/dL — ABNORMAL HIGH (ref 70–99)
Glucose-Capillary: 198 mg/dL — ABNORMAL HIGH (ref 70–99)
Glucose-Capillary: 160 mg/dL — ABNORMAL HIGH (ref 70–99)
Glucose-Capillary: 179 mg/dL — ABNORMAL HIGH (ref 70–99)

## 2019-11-20 MED ORDER — SODIUM CHLORIDE 0.9% IV SOLUTION: Freq: Once | INTRAVENOUS | Status: AC

## 2019-11-20 MED ORDER — CHLORHEXIDINE GLUCONATE CLOTH 2 % EX PADS
6.0000 | MEDICATED_PAD | Freq: Every day | CUTANEOUS | Status: DC
  Administered 2019-11-20: 6 via TOPICAL

## 2019-11-20 NOTE — Plan of Care (Signed)

## 2019-11-20 NOTE — Plan of Care (Signed)
  Problem: Education: Goal: Knowledge of General Education information will improve Description: Including pain rating scale, medication(s)/side effects and non-pharmacologic comfort measures Outcome: Progressing   Problem: Health Behavior/Discharge Planning: Goal: Ability to manage health-related needs will improve Outcome: Progressing   Problem: Activity: Goal: Risk for activity intolerance will decrease Outcome: Progressing   Problem: Elimination: Goal: Will not experience complications related to bowel motility Outcome: Progressing   Problem: Pain Managment: Goal: General experience of comfort will improve Outcome: Progressing   Problem: Safety: Goal: Ability to remain free from injury will improve Outcome: Progressing   

## 2019-11-20 NOTE — Progress Notes (Signed)
[facility] Progress Note   Subjective:   Patient seen and examined in room this morning.  Reports she just got pain medication and is tired.  She also reports some persistent nausea throughout hospitalization but no vomiting.  Denies shortness of breath, orthopnea, chest pain, palpitations, dizziness, constipation, diarrhea.  Pain well controlled at present.  Objective Vitals:   11/19/19 1313 11/19/19 1923 11/20/19 0359 11/20/19 0817  BP: (!) 108/32 (!) 92/28 (!) 118/33 (!) 117/56  Pulse: 77 81 80 81  Resp: 15 16 17 17   Temp: 97.8 F (36.6 C) 98.8 F (37.1 C) 98.3 F (36.8 C) 98.4 F (36.9 C)  TempSrc:  Oral Oral Oral  SpO2: 94% 95% 97% 95%  Weight:      Height:       Physical Exam General: Well developed, well nourished female. Alert and in NAD Heart: RRR, 4/6 systolic murmur Lungs: CTA bilaterally without wheezing, rhonchi or rales Abdomen: Soft, non-tender,non-distended, + BS Extremities: B/l AKA, R AKA bandaged. No pitting edema. Dialysis Access:  RUE AVF + bruit   Additional Objective Labs: Basic Metabolic Panel: Recent Labs  Lab 11/18/19 0259 11/19/19 0333 11/20/19 0413  NA 140 136 138  K 5.0 3.9 3.8  CL 97* 96* 98  CO2 26 28 28   GLUCOSE 109* 129* 110*  BUN 37* 16 35*  CREATININE 7.02* 4.42* 6.21*  CALCIUM 8.6* 8.5* 8.6*  PHOS  --   --  5.7*   Liver Function Tests: Recent Labs  Lab 11/17/19 0626 11/20/19 0413  AST 43*  --   ALT 35  --   ALKPHOS 211*  --   BILITOT 0.8  --   PROT 6.5  --   ALBUMIN 2.7* 2.4*   CBC: Recent Labs  Lab 11/17/19 0626 11/17/19 0735 11/18/19 0259 11/19/19 0333 11/20/19 0413  WBC 8.0   < > 9.6 9.4 8.3  HGB 8.8*   < > 7.6* 7.2* 6.7*  HCT 28.3*   < > 24.1* 23.2* 21.2*  MCV 97.9  --  96.8 98.3 96.8  PLT 262   < > 309 236 241   < > = values in this interval not displayed.   Blood Culture    Component Value Date/Time   SDES BLOOD LEFT HAND 10/19/2019 1959   SPECREQUEST  10/19/2019 1959   BOTTLES DRAWN AEROBIC ONLY Blood Culture adequate volume   CULT  10/19/2019 1959    NO GROWTH 5 DAYS Performed at [facility], [place] [address], [place], [place] [address]    REPTSTATUS 10/24/2019 FINAL 10/19/2019 1959    CBG: Recent Labs  Lab 11/18/19 2058 11/19/19 0729 11/19/19 1155 11/19/19 1640 11/20/19 0815  GLUCAP 131* 135* 161* 164* 130*   Medications: . magnesium sulfate bolus IVPB     . sodium chloride   Intravenous Once  . aspirin EC  81 mg Oral Daily  . atropine  1 drop Right Eye BID  . carvedilol  12.5 mg Oral BID  . Chlorhexidine Gluconate Cloth  6 each Topical Once   And  . Chlorhexidine Gluconate Cloth  6 each Topical Once  . Chlorhexidine Gluconate Cloth  6 each Topical Q0600  . [START ON 11/21/2019] darbepoetin (ARANESP) injection - DIALYSIS  100 mcg Intravenous Q Mon-HD  . diphenhydrAMINE  25 mg Oral TID  . doxercalciferol  1 mcg Intravenous Q M,W,F-HD  . DULoxetine  30 mg Oral Daily  . feeding supplement (PRO-STAT SUGAR FREE 64)  30 mL Oral BID  .  gabapentin  100 mg Oral BID  . insulin aspart  0-6 Units Subcutaneous TID WC  . lanthanum  4,000 mg Oral TID WC   And  . lanthanum  2,000 mg Oral With snacks  . pantoprazole  40 mg Oral BID  . prednisoLONE acetate  1 drop Right Eye BID  . traMADol  25 mg Oral BID    Dialysis Orders: MWF @ [facility] 4hr, 400/A1.5, EDW 81kg, 2K/2.25Ca, AVF, UF profile 2, no heparin - Hectoral 58mcg IV q HD - Mircera 240mcg IV q 2 weeks (last 4/19)   Assessment/Plan: 1. R heel wound, now s/p R AKA 4/29: Per vascular surgery team. 2. ESRD: K+ 3.8.Continue HD per MWF schedule - next Monday 5/3. No heparin. 3. Hypertension/volume:BP well controlled, euvolemic on exam. On carvedilol, lisinopril is on hold. Will need new EDW s/p amputation, appears to be approx. 75kg.  4. Anemiaof ESRD:Hgb down to 6.7 this AM. 1 unit PRBC ordered. ESA ordered with next dialysis on 5/3.  5. Metabolic bone disease:Corrected calcium  9.9. Continue hectorol. Phos 5.7, continue lanthanum.  6. Nutrition:Alb low - continue pro stat supplements.  7. T2DM: Per primary.  Anice Paganini, PA-C 11/20/2019, 8:35 AM  [facility] Pager: [phone]

## 2019-11-20 NOTE — Progress Notes (Signed)
PROGRESS NOTE    [name]  SWF:[id number] DOB: 1950-09-09 DOA: 11/17/2019 PCP: Prince Solian, MD   Brief Narrative:  [name] is a 69 y.o. 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 Admitted under hospital service.  Dressing changes by vascular surgery.  She had acute blood loss anemia due to bleeding from the stump and dropped her hemoglobin to 6.7 today and received 1 unit of PRBC transfusion.  Cleared by vascular surgery but sister not comfortable taking her home today.  Assessment & Plan:   Principal Problem:   S/P AKA (above knee amputation), right (HCC) Active Problems:   Type II diabetes mellitus with renal manifestations (HCC)   Essential hypertension   ESRD on dialysis ([place])   Blindness of both eyes   Chronic combined systolic and diastolic CHF (congestive heart failure) (HCC)   Anemia in ESRD (end-stage renal disease) ([place])   Asthma   PAD (peripheral artery disease) ([place])  PAD/nonhealing right foot ulcer s/p right AKA: Pain controlled.  She is on Dilaudid.  Continue current pain medications.  She has soaked dressing.  Per my discussion with Dr. Carlis Abbott, patient is cleared to go home however patient sister is not comfortable taking her home today and would like for her to be observed in the hospital overnight.  Advised RN to change the dressing.  Acute blood loss anemia: Dropped from 8.8 preoperatively to 7.2 postoperatively and now 6.7.  Transfuse 1 unit of PRBC.  Repeat CBC in the morning.  ESRD on MWF HD: Nephrology on board.  Next dialysis tomorrow.  Essential HTN: Blood pressure still on the lower side.  We will continue Coreg but discontinue lisinopril for now.  DM type II: Hemoglobin A1c 6.0 on 10/19/2019. -Good control on SSI.  Continue that.  Bilateral blindness -Will need additional nursing assistance while  hospitalized -Continue Atropine and Prednisolone drops  Asthma: Stable. -PRN albuterol HFA  Obesity Body mass index is 31.62 kg/m. -Weight loss should be encouraged, but will be difficult given mobility limitations  DVT prophylaxis: Heparin   Code Status: Full Code  Family Communication: Sister at the bedside.  Plan of care discussed with her in length and he verbalized understanding and agreed with it. Patient is from: Home Disposition Plan: Home  Barriers to discharge: Acute blood loss anemia/family not willing to take her home.  Status is: Inpatient  Remains inpatient appropriate because:Inpatient level of care appropriate due to severity of illness   Dispo: The patient is from: Home              Anticipated d/c is to: Home              Anticipated d/c date is: 1 day              Patient currently is medically stable for discharge         Estimated body mass index is 27.4 kg/m as calculated from the following:   Height as of this encounter: 5\' 5"  (1.651 m).   Weight as of this encounter: 74.7 kg.  Pressure Injury 09/30/19 Heel Right Unstageable - Full thickness tissue loss in which the base of the injury is covered by slough (yellow, tan, gray, green or brown) and/or eschar (tan, brown or black) in the wound bed. black tender to touch small open r (Active)  09/30/19 2153  Location:  Heel  Location Orientation: Right  Staging: Unstageable - Full thickness tissue loss in which the base of the injury is covered by slough (yellow, tan, gray, green or brown) and/or eschar (tan, brown or black) in the wound bed.  Wound Description (Comments): black tender to touch small open redden area  Present on Admission: Yes     Nutritional status:               Consultants:   Vascular surgery  Procedures:   Right BKA  Antimicrobials:  Anti-infectives (From admission, onward)   Start     Dose/Rate Route Frequency Ordered Stop   11/17/19 2200  ceFAZolin  (ANCEF) IVPB 1 g/50 mL premix     1 g 100 mL/hr over 30 Minutes Intravenous Every 12 hours 11/17/19 1553 11/18/19 1705   11/17/19 0629  ceFAZolin (ANCEF) IVPB 2g/100 mL premix     2 g 200 mL/hr over 30 Minutes Intravenous 30 min pre-op 11/17/19 0300 11/17/19 0824         Subjective: Patient seen and examined.  Sister at the bedside.  Patient has no complaints.  Objective: Vitals:   11/20/19 1023 11/20/19 1024 11/20/19 1039 11/20/19 1045  BP: 94/77 (!) 98/45 (!) 99/42 (!) 105/38  Pulse: 83 83 82 79  Resp: 18 17 18 18   Temp: 98.6 F (37 C) 98.6 F (37 C) 98.6 F (37 C) 98.6 F (37 C)  TempSrc:      SpO2: 91% 92% 92% 95%  Weight:      Height:        Intake/Output Summary (Last 24 hours) at 11/20/2019 1259 Last data filed at 11/20/2019 0900 Gross per 24 hour  Intake 720 ml  Output --  Net 720 ml   Filed Weights   11/17/19 0713 11/18/19 0855 11/18/19 1311  Weight: 86.2 kg 76.7 kg 74.7 kg    Examination:  General exam: Appears calm and comfortable, bilateral blindness Respiratory system: Clear to auscultation. Respiratory effort normal. Cardiovascular system: S1 & S2 heard, RRR. No JVD, murmurs, rubs, gallops or clicks. No pedal edema. Gastrointestinal system: Abdomen is nondistended, soft and nontender. No organomegaly or masses felt. Normal bowel sounds heard. Central nervous system: Alert and oriented. No focal neurological deficits. Extremities: Bilateral BKA with dressing in the right stump soaked with blood. Skin: No rashes, lesions or ulcers.  Psychiatry: Judgement and insight appear poor. Mood & affect appropriate.   Data Reviewed: I have personally reviewed following labs and imaging studies  CBC: Recent Labs  Lab 11/17/19 0626 11/17/19 0735 11/18/19 0259 11/19/19 0333 11/20/19 0413  WBC 8.0  --  9.6 9.4 8.3  HGB 8.8* 10.9* 7.6* 7.2* 6.7*  HCT 28.3* 32.0* 24.1* 23.2* 21.2*  MCV 97.9  --  96.8 98.3 96.8  PLT 262  --  309 236 923   Basic Metabolic  Panel: Recent Labs  Lab 11/17/19 0626 11/17/19 0735 11/18/19 0259 11/19/19 0333 11/20/19 0413  NA 140 140 140 136 138  K 3.7 3.7 5.0 3.9 3.8  CL 98 96* 97* 96* 98  CO2 29  --  26 28 28   GLUCOSE 99 68* 109* 129* 110*  BUN 29* 28* 37* 16 35*  CREATININE 5.79* 5.70* 7.02* 4.42* 6.21*  CALCIUM 8.5*  --  8.6* 8.5* 8.6*  PHOS  --   --   --   --  5.7*   GFR: Estimated Creatinine Clearance: 9 mL/min (A) (by C-G formula based on SCr of 6.21 mg/dL (H)). Liver Function  Tests: Recent Labs  Lab 11/17/19 0626 11/20/19 0413  AST 43*  --   ALT 35  --   ALKPHOS 211*  --   BILITOT 0.8  --   PROT 6.5  --   ALBUMIN 2.7* 2.4*   No results for input(s): LIPASE, AMYLASE in the last 168 hours. No results for input(s): AMMONIA in the last 168 hours. Coagulation Profile: Recent Labs  Lab 11/17/19 0626  INR 1.3*   Cardiac Enzymes: No results for input(s): CKTOTAL, CKMB, CKMBINDEX, TROPONINI in the last 168 hours. BNP (last 3 results) No results for input(s): PROBNP in the last 8760 hours. HbA1C: No results for input(s): HGBA1C in the last 72 hours. CBG: Recent Labs  Lab 11/19/19 0729 11/19/19 1155 11/19/19 1640 11/20/19 0815 11/20/19 1141  GLUCAP 135* 161* 164* 130* 177*   Lipid Profile: No results for input(s): CHOL, HDL, LDLCALC, TRIG, CHOLHDL, LDLDIRECT in the last 72 hours. Thyroid Function Tests: No results for input(s): TSH, T4TOTAL, FREET4, T3FREE, THYROIDAB in the last 72 hours. Anemia Panel: No results for input(s): VITAMINB12, FOLATE, FERRITIN, TIBC, IRON, RETICCTPCT in the last 72 hours. Sepsis Labs: No results for input(s): PROCALCITON, LATICACIDVEN in the last 168 hours.  Recent Results (from the past 240 hour(s))  SARS CORONAVIRUS 2 (TAT 6-24 HRS) Nasopharyngeal Nasopharyngeal Swab     Status: None   Collection Time: 11/15/19  3:05 PM   Specimen: Nasopharyngeal Swab  Result Value Ref Range Status   SARS Coronavirus 2 NEGATIVE NEGATIVE Final    Comment:  (NOTE) SARS-CoV-2 target nucleic acids are NOT DETECTED. The SARS-CoV-2 RNA is generally detectable in upper and lower respiratory specimens during the acute phase of infection. Negative results do not preclude SARS-CoV-2 infection, do not rule out co-infections with other pathogens, and should not be used as the sole basis for treatment or other patient management decisions. Negative results must be combined with clinical observations, patient history, and epidemiological information. The expected result is Negative. Fact Sheet for Patients: SugarRoll.be Fact Sheet for Healthcare Providers: https://www.woods-mathews.com/ This test is not yet approved or cleared by the [place] FDA and  has been authorized for detection and/or diagnosis of SARS-CoV-2 by FDA under an Emergency Use Authorization (EUA). This EUA will remain  in effect (meaning this test can be used) for the duration of the COVID-19 declaration under Section 56 4(b)(1) of the Act, 21 U.S.C. section 360bbb-3(b)(1), unless the authorization is terminated or revoked sooner. Performed at [facility], [place] [address], [place], [place] [address]   Surgical pcr screen     Status: None   Collection Time: 11/17/19  6:32 AM   Specimen: Nasal Mucosa; Nasal Swab  Result Value Ref Range Status   MRSA, PCR NEGATIVE NEGATIVE Final   Staphylococcus aureus NEGATIVE NEGATIVE Final    Comment: (NOTE) The Xpert SA Assay (FDA approved for NASAL specimens in patients 83 years of age and older), is one component of a comprehensive surveillance program. It is not intended to diagnose infection nor to guide or monitor treatment. Performed at [facility], [place] [address], [place], [place] [address]       Radiology Studies: No results found.  Scheduled Meds: . aspirin EC  81 mg Oral Daily  . atropine  1 drop Right Eye BID  . carvedilol  12.5 mg Oral BID  . Chlorhexidine  Gluconate Cloth  6 each Topical Once   And  . Chlorhexidine Gluconate Cloth  6 each Topical Once  .  Chlorhexidine Gluconate Cloth  6 each Topical Q0600  . Chlorhexidine Gluconate Cloth  6 each Topical Q0600  . [START ON 11/21/2019] darbepoetin (ARANESP) injection - DIALYSIS  100 mcg Intravenous Q Mon-HD  . diphenhydrAMINE  25 mg Oral TID  . doxercalciferol  1 mcg Intravenous Q M,W,F-HD  . DULoxetine  30 mg Oral Daily  . feeding supplement (PRO-STAT SUGAR FREE 64)  30 mL Oral BID  . gabapentin  100 mg Oral BID  . insulin aspart  0-6 Units Subcutaneous TID WC  . lanthanum  4,000 mg Oral TID WC   And  . lanthanum  2,000 mg Oral With snacks  . pantoprazole  40 mg Oral BID  . prednisoLONE acetate  1 drop Right Eye BID  . traMADol  25 mg Oral BID   Continuous Infusions: . magnesium sulfate bolus IVPB       LOS: 3 days   Time spent: 30 minutes   Darliss Cheney, MD [facility]  11/20/2019, 12:59 PM   To contact the attending provider between 7A-7P or the covering provider during after hours 7P-7A, please log into the web site [email or web address].

## 2019-11-21 ENCOUNTER — Other Ambulatory Visit: Payer: Self-pay

## 2019-11-21 LAB — BASIC METABOLIC PANEL
Anion gap: 15 (ref 5–15)
BUN: 49 mg/dL — ABNORMAL HIGH (ref 8–23)
CO2: 24 mmol/L (ref 22–32)
Calcium: 8.6 mg/dL — ABNORMAL LOW (ref 8.9–10.3)
Chloride: 98 mmol/L (ref 98–111)
Creatinine, Ser: 8.03 mg/dL — ABNORMAL HIGH (ref 0.44–1.00)
GFR calc Af Amer: 5 mL/min — ABNORMAL LOW (ref >60)
GFR calc non Af Amer: 5 mL/min — ABNORMAL LOW (ref >60)
Glucose, Bld: 111 mg/dL — ABNORMAL HIGH (ref 70–99)
Potassium: 4.1 mmol/L (ref 3.5–5.1)
Sodium: 137 mmol/L (ref 135–145)

## 2019-11-21 LAB — CBC WITH DIFFERENTIAL/PLATELET
Abs Immature Granulocytes: 0.02 10*3/uL (ref 0.00–0.07)
Basophils Absolute: 0.1 10*3/uL (ref 0.0–0.1)
Basophils Relative: 1 %
Eosinophils Absolute: 0.4 10*3/uL (ref 0.0–0.5)
Eosinophils Relative: 5 %
HCT: 25.5 % — ABNORMAL LOW (ref 36.0–46.0)
Hemoglobin: 8.1 g/dL — ABNORMAL LOW (ref 12.0–15.0)
Immature Granulocytes: 0 %
Lymphocytes Relative: 19 %
Lymphs Abs: 1.6 10*3/uL (ref 0.7–4.0)
MCH: 30.6 pg (ref 26.0–34.0)
MCHC: 31.8 g/dL (ref 30.0–36.0)
MCV: 96.2 fL (ref 80.0–100.0)
Monocytes Absolute: 0.9 10*3/uL (ref 0.1–1.0)
Monocytes Relative: 11 %
Neutro Abs: 5.3 10*3/uL (ref 1.7–7.7)
Neutrophils Relative %: 64 %
Platelets: 251 10*3/uL (ref 150–400)
RBC: 2.65 MIL/uL — ABNORMAL LOW (ref 3.87–5.11)
RDW: 19.7 % — ABNORMAL HIGH (ref 11.5–15.5)
WBC: 8.3 10*3/uL (ref 4.0–10.5)
nRBC: 0 % (ref 0.0–0.2)

## 2019-11-21 LAB — BPAM RBC
Blood Product Expiration Date: 202105082359
ISSUE DATE / TIME: 202105021014
Unit Type and Rh: 6200

## 2019-11-21 LAB — TYPE AND SCREEN
ABO/RH(D): A POS
Antibody Screen: NEGATIVE
Unit division: 0

## 2019-11-21 LAB — GLUCOSE, CAPILLARY: Glucose-Capillary: 112 mg/dL — ABNORMAL HIGH (ref 70–99)

## 2019-11-21 LAB — SURGICAL PATHOLOGY

## 2019-11-21 MED ORDER — DARBEPOETIN ALFA 100 MCG/0.5ML IJ SOSY
PREFILLED_SYRINGE | INTRAMUSCULAR | Status: AC
  Filled 2019-11-21: qty 0.5

## 2019-11-21 MED ORDER — ACETAMINOPHEN 325 MG PO TABS
ORAL_TABLET | ORAL | Status: AC
  Filled 2019-11-21: qty 2

## 2019-11-21 MED ORDER — DOXERCALCIFEROL 4 MCG/2ML IV SOLN
INTRAVENOUS | Status: AC
  Filled 2019-11-21: qty 2

## 2019-11-21 NOTE — Plan of Care (Signed)

## 2019-11-21 NOTE — Progress Notes (Signed)
[facility] Progress Note   Subjective:  Patient seen and examined at bedside in dialysis.  Tolerating dialysis well.  Pain currently well controlled.  Appetite improving.  Denies SOB, CP, n/v/d, dizziness. abdominal pain and fatigue.   Objective Vitals:   11/21/19 0830 11/21/19 0900 11/21/19 0930 11/21/19 1000  BP: (!) 119/47 (!) 99/28 (!) 129/43 (!) 125/36  Pulse: 79 78 75 76  Resp:      Temp:      TempSrc:      SpO2:      Weight:      Height:       Physical Exam General:NAD, chronically ill appearing, pleasant female, laying in bed Heart:RRR, +1/6 systolic murmur Lungs:CTAB Abdomen:soft, NTND, +BS Extremities:1+ edema in hips, trace stump edema, b/l AKA, R stump bandaged Dialysis Access: RUE AVF accessed   Filed Weights   11/17/19 0713 11/18/19 0855 11/18/19 1311  Weight: 86.2 kg 76.7 kg 74.7 kg    Intake/Output Summary (Last 24 hours) at 11/21/2019 1014 Last data filed at 11/20/2019 1700 Gross per 24 hour  Intake 485 ml  Output --  Net 485 ml    Additional Objective Labs: Basic Metabolic Panel: Recent Labs  Lab 11/19/19 0333 11/20/19 0413 11/21/19 0511  NA 136 138 137  K 3.9 3.8 4.1  CL 96* 98 98  CO2 28 28 24   GLUCOSE 129* 110* 111*  BUN 16 35* 49*  CREATININE 4.42* 6.21* 8.03*  CALCIUM 8.5* 8.6* 8.6*  PHOS  --  5.7*  --    Liver Function Tests: Recent Labs  Lab 11/17/19 0626 11/20/19 0413  AST 43*  --   ALT 35  --   ALKPHOS 211*  --   BILITOT 0.8  --   PROT 6.5  --   ALBUMIN 2.7* 2.4*   CBC: Recent Labs  Lab 11/17/19 0626 11/17/19 0735 11/18/19 0259 11/18/19 0259 11/19/19 0333 11/20/19 0413 11/21/19 0511  WBC 8.0   < > 9.6   < > 9.4 8.3 8.3  NEUTROABS  --   --   --   --   --   --  5.3  HGB 8.8*   < > 7.6*   < > 7.2* 6.7* 8.1*  HCT 28.3*   < > 24.1*   < > 23.2* 21.2* 25.5*  MCV 97.9  --  96.8  --  98.3 96.8 96.2  PLT 262   < > 309   < > 236 241 251   < > = values in this interval not displayed.   Blood Culture     Component Value Date/Time   SDES BLOOD LEFT HAND 10/19/2019 1959   SPECREQUEST  10/19/2019 1959    BOTTLES DRAWN AEROBIC ONLY Blood Culture adequate volume   CULT  10/19/2019 1959    NO GROWTH 5 DAYS Performed at [facility], [place] [address] [place], [place] [address]    REPTSTATUS 10/24/2019 FINAL 10/19/2019 1959   CBG: Recent Labs  Lab 11/20/19 1141 11/20/19 1503 11/20/19 1725 11/20/19 2036 11/21/19 0642  GLUCAP 177* 198* 160* 179* 112*    Lab Results  Component Value Date   INR 1.3 (H) 11/17/2019   INR 1.16 05/07/2018   INR 1.13 02/06/2018   Studies/Results: No results found.  Medications: . magnesium sulfate bolus IVPB     . acetaminophen      . aspirin EC  81 mg Oral Daily  . atropine  1 drop Right Eye BID  . carvedilol  12.5 mg Oral BID  . Chlorhexidine Gluconate Cloth  6 each Topical Once   And  . Chlorhexidine Gluconate Cloth  6 each Topical Once  . Chlorhexidine Gluconate Cloth  6 each Topical Q0600  . Chlorhexidine Gluconate Cloth  6 each Topical Q0600  . darbepoetin (ARANESP) injection - DIALYSIS  100 mcg Intravenous Q Mon-HD  . diphenhydrAMINE  25 mg Oral TID  . doxercalciferol  1 mcg Intravenous Q M,W,F-HD  . DULoxetine  30 mg Oral Daily  . feeding supplement (PRO-STAT SUGAR FREE 64)  30 mL Oral BID  . gabapentin  100 mg Oral BID  . insulin aspart  0-6 Units Subcutaneous TID WC  . lanthanum  4,000 mg Oral TID WC   And  . lanthanum  2,000 mg Oral With snacks  . pantoprazole  40 mg Oral BID  . prednisoLONE acetate  1 drop Right Eye BID  . traMADol  25 mg Oral BID    Dialysis Orders: MWF @ [facility] 4hr, 400/A1.5, EDW 81kg, 2K/2.25Ca, AVF, UF profile 2, no heparin - Hectoral 71mcg IV q HD - Mircera 260mcg IV q 2 weeks (last 4/19)  Assessment/Plan: 1. R heel wound, now s/p R AKA 4/29: Per vascular surgery team. Pain well controlled.  2. ESRD:on HD MWF.  Tolerating HD well today. K 4.1. No Heparin.  3. Hypertension/volume:BP well  controlled, UF goal 2L. +edema on exam, continue to titrate down as tolerated. Will need new EDW s/p amputation, appears to be around 74-75 kg. 4. Anemiaof ESRD:Hgb improved to 8.1 s/p 1 unit pRBC.  ESA ordered with next dialysis on 5/3.  5. Metabolic bone disease:Corrected calcium 9.9. Continue hectorol. last Phos 5.7, continue lanthanum. 6. Nutrition:Alb low -continue pro stat supplements. 7. T2DM: Per primary.  Jen Mow, PA-C [facility] Pager: [phone] 11/21/2019,10:14 AM  LOS: 4 days

## 2019-11-21 NOTE — Progress Notes (Addendum)
  Progress Note    11/21/2019 7:38 AM 4 Days Post-Op  Subjective:  No complaints   Vitals:   11/20/19 2040 11/21/19 0501  BP: (!) 103/53 112/80  Pulse: 79 80  Resp: 14 16  Temp: 98.8 F (37.1 C) 98.6 F (37 C)  SpO2: 100% 97%   Physical Exam: Lungs:  Non labored Incisions:  R AKA incision with sanguinous drainage on dressing but no active bleeding and no firm hematoma along incision Abdomen:  Soft Neurologic: A&O  CBC    Component Value Date/Time   WBC 8.3 11/21/2019 0511   RBC 2.65 (L) 11/21/2019 0511   HGB 8.1 (L) 11/21/2019 0511   HGB 6.5 (LL) 06/13/2013 1037   HCT 25.5 (L) 11/21/2019 0511   HCT 20.6 (L) 06/13/2013 1037   PLT 251 11/21/2019 0511   PLT 367 06/13/2013 1037   MCV 96.2 11/21/2019 0511   MCV 83.4 06/13/2013 1037   MCH 30.6 11/21/2019 0511   MCHC 31.8 11/21/2019 0511   RDW 19.7 (H) 11/21/2019 0511   RDW 16.0 (H) 06/13/2013 1037   LYMPHSABS 1.6 11/21/2019 0511   LYMPHSABS 1.6 06/13/2013 1037   MONOABS 0.9 11/21/2019 0511   MONOABS 0.5 06/13/2013 1037   EOSABS 0.4 11/21/2019 0511   EOSABS 0.3 06/13/2013 1037   BASOSABS 0.1 11/21/2019 0511   BASOSABS 0.0 06/13/2013 1037    BMET    Component Value Date/Time   NA 137 11/21/2019 0511   NA 141 06/13/2013 1037   K 4.1 11/21/2019 0511   K 4.4 06/13/2013 1037   CL 98 11/21/2019 0511   CL 110 (H) 10/01/2012 1052   CO2 24 11/21/2019 0511   CO2 20 (L) 06/13/2013 1037   GLUCOSE 111 (H) 11/21/2019 0511   GLUCOSE 124 06/13/2013 1037   GLUCOSE 101 (H) 10/01/2012 1052   BUN 49 (H) 11/21/2019 0511   BUN 93.4 (H) 06/13/2013 1037   CREATININE 8.03 (H) 11/21/2019 0511   CREATININE 9.3 (HH) 06/13/2013 1037   CALCIUM 8.6 (L) 11/21/2019 0511   CALCIUM 8.8 06/13/2013 1037   GFRNONAA 5 (L) 11/21/2019 0511   GFRAA 5 (L) 11/21/2019 0511    INR    Component Value Date/Time   INR 1.3 (H) 11/17/2019 0626     Intake/Output Summary (Last 24 hours) at 11/21/2019 0738 Last data filed at 11/20/2019 1700 Gross  per 24 hour  Intake 725 ml  Output --  Net 725 ml     Assessment/Plan:  69 y.o. female is s/p R AKA 4 Days Post-Op   No further bleeding from incision Hold pressure and apply pressure wrap with ACE if bleeding continues Patient will follow up in office in about 4 weeks for staple removal   Dagoberto Ligas, PA-C [facility] [phone] 11/21/2019 7:38 AM  I have examined the patient, reviewed and agree with above.  For discharge today.  Had left above-knee amputation on 10/21/2019.  Will remain remove remaining staples today.  We will see in the office in 4 weeks for staple removal on her right AKA  Curt Jews, MD 11/21/2019 2:29 PM

## 2019-11-21 NOTE — Consult Note (Signed)
   [place] Inpatient Consult   11/21/2019  [name] Nov 21, 1950 [id number]   Patient in the Medicare ACO with less than 30 days readmission, new AKA, home with home health. Patient with exterme high risk score for unplanned readmission.  Assigned to [place] Coordinator for additional support and follow up. PCP [facility] office does the TOC.  Natividad Brood, RN BSN [facility] Liaison  [phone] business mobile phone Toll free office [phone]  Fax number: [phone] [email or web address]

## 2019-11-21 NOTE — Progress Notes (Signed)
PT Cancellation Note  Patient Details Name: [name] MRN: [id number] DOB: 10-Aug-1950   Cancelled Treatment:    Reason Eval/Treat Not Completed: (P) Patient at procedure or test/unavailable(Pt off unit for HD, will f/u per POC.)   Caterin Tabares Eli Hose 11/21/2019, 10:00 AM  Erasmo Leventhal , PTA Acute Rehabilitation Services Pager [phone] Office [phone]

## 2019-11-21 NOTE — Progress Notes (Signed)
Patient to HD 

## 2019-11-21 NOTE — Progress Notes (Signed)
Inpatient Rehabilitation Admissions Coordinator  Noted plans to d/c home with [facility]. We will sign off at this time.  Danne Baxter, RN, MSN Rehab Admissions Coordinator [phone] 11/21/2019 8:20 AM

## 2019-11-21 NOTE — Discharge Summary (Addendum)
Physician Discharge Summary  [name] DPO:[id number] DOB: 1951-07-20 DOA: 11/17/2019  PCP: Prince Solian, MD  Admit date: 11/17/2019 Discharge date: 11/21/2019  Admitted From: Home Disposition: Home  Recommendations for Outpatient Follow-up:  1. Follow up with PCP in 1-2 weeks 2. Follow with vascular surgery in 4 weeks for staple removal 3. Please obtain BMP/CBC in one week 4. Please follow up on the following pending results:  Home Health: Yes Equipment/Devices: None  Discharge Condition: Stable CODE STATUS: Full code Diet recommendation: Cardiac  Subjective: Seen and examined this morning in dialysis unit.  She had no complaint.  Pain well controlled.  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  She was afterwards admitted under hospital service.  Dressing changes by vascular surgery.  She had acute blood loss anemia due to bleeding from the stump and dropped her hemoglobin to 6.7 on 5-21 and received 1 unit of PRBC transfusion.    She is not bleeding from the stump any more as per vascular surgery note.  She was seen by PT OT initially they recommended CIR however patient sister decided to take the patient home with home health.  Patient's hemoglobin has remained stable since transfusion.  Patient's pain is very well controlled.  She is stable for discharge.  I personally spoke to [name] over the phone who agrees with the discharge plan.  Home health is arranged for her.  They also understand the dressing changes as advised to them by vascular surgery.  She also received her hemodialysis with nephrology as a scheduled.  Discharge Diagnoses:  Principal Problem:   S/P AKA (above knee amputation), right (HCC) Active Problems:   Type II diabetes mellitus with renal manifestations  (HCC)   Essential hypertension   ESRD on dialysis ([place])   Blindness of both eyes   Chronic combined systolic and diastolic CHF (congestive heart failure) (HCC)   Anemia in ESRD (end-stage renal disease) ([place])   Asthma   PAD (peripheral artery disease) ([place])    Discharge Instructions   Allergies as of 11/21/2019      Reactions   Tape Itching, Rash   43M Transpore adhesive tape. Medical tape pulls off the skin!! PAPER TAPE ONLY, PLEASE   Latex Hives   Oxycodone Other (See Comments)   Hallucinations    Tramadol Other (See Comments)   Hallucinations with a full tablet   Morphine And Related Other (See Comments)   Hallucinations   Vicodin [hydrocodone-acetaminophen] Other (See Comments)   Hallucinations      Medication List    TAKE these medications   acetaminophen 500 MG tablet Commonly known as: TYLENOL Take 1,000 mg by mouth 2 (two) times daily with a meal.   albuterol 108 (90 Base) MCG/ACT inhaler Commonly known as: VENTOLIN HFA Inhale 2 puffs into the lungs every 6 (six) hours as needed for wheezing or shortness of breath.   albuterol (2.5 MG/43ML) 0.083% nebulizer solution Commonly known as: PROVENTIL Take 3 mLs (2.5 mg total) by nebulization every 6 (six) hours as needed for wheezing or shortness of breath.   aspirin EC 81 MG tablet Take 81 mg by mouth daily.   atropine 1 % ophthalmic solution Place 1 drop into the right eye 2 (two) times daily.   B-D SINGLE USE SWABS REGULAR Pads in the morning, at noon,  and at bedtime.   BD Pen Needle Nano 2nd Gen 32G X 4 MM Misc Generic drug: Insulin Pen Needle in the morning, at noon, and at bedtime.   carvedilol 25 MG tablet Commonly known as: COREG Take 0.5 tablets (12.5 mg total) by mouth 2 (two) times daily.   clopidogrel 75 MG tablet Commonly known as: PLAVIX TAKE 1 TABLET BY MOUTH EVERY DAY   clopidogrel 75 MG tablet Commonly known as: PLAVIX TAKE 1 TABLET BY MOUTH EVERY DAY   darbepoetin 200 MCG/0.4ML Soln  injection Commonly known as: ARANESP Inject 0.4 mLs (200 mcg total) into the vein every Wednesday with hemodialysis.   diclofenac sodium 1 % Gel Commonly known as: VOLTAREN Apply 2 g topically 4 (four) times daily as needed (pain).   diphenhydrAMINE 25 MG tablet Commonly known as: BENADRYL Take 25 mg by mouth in the morning, at noon, and at bedtime.   docusate sodium 100 MG capsule Commonly known as: COLACE Take 100 mg by mouth 2 (two) times daily with a meal.   doxercalciferol 4 MCG/2ML injection Commonly known as: HECTOROL Inject 0.5 mLs (1 mcg total) into the vein every Monday, Wednesday, and Friday with hemodialysis.   DULoxetine 30 MG capsule Commonly known as: CYMBALTA Take 30 mg by mouth daily.   fluticasone 50 MCG/ACT nasal spray Commonly known as: FLONASE Place 2 sprays into both nostrils daily as needed for allergies.   gabapentin 100 MG capsule Commonly known as: NEURONTIN Take 100 mg by mouth 2 (two) times daily.   lanthanum 1000 MG chewable tablet Commonly known as: FOSRENOL Chew 2,000-4,000 mg by mouth See admin instructions. Chew 4,000 mg by mouth with each meal and 2,000 mg with each snack   lisinopril 40 MG tablet Commonly known as: ZESTRIL Take 0.5 tablets (20 mg total) by mouth at bedtime.   NovoLOG FlexPen 100 UNIT/ML FlexPen Generic drug: insulin aspart Inject 2-10 Units into the skin 2 (two) times daily with a meal.   omeprazole 40 MG capsule Commonly known as: PRILOSEC Take 1 capsule (40 mg total) by mouth daily. What changed: when to take this   OneTouch Verio test strip Generic drug: glucose blood 1 each by Other route 3 (three) times daily.   prednisoLONE acetate 1 % ophthalmic suspension Commonly known as: PRED FORTE Place 1 drop into the right eye 2 (two) times daily.   Rena-Vite Rx 1 MG Tabs Take 1 tablet by mouth daily with breakfast.   traMADol 50 MG tablet Commonly known as: Ultram Take 1 tablet (50 mg total) by mouth every  6 (six) hours as needed. What changed:   how much to take  when to take this      [place] Follow up.   Specialty: Home Health Services Why: [facility] will continue to provide RN, PT, OT, MSW, and aide. Contact information: [place] [address] [phone]        Prince Solian, MD Follow up in 1 week(s).   Specialty: Internal Medicine Contact information: [place] [address] [phone]          Allergies  Allergen Reactions  . Tape Itching and Rash    60M Transpore adhesive tape. Medical tape pulls off the skin!! PAPER TAPE ONLY, PLEASE  . Latex Hives  . Oxycodone Other (See Comments)    Hallucinations   . Tramadol Other (See Comments)    Hallucinations with a  full tablet  . Morphine And Related Other (See Comments)    Hallucinations   . Vicodin [Hydrocodone-Acetaminophen] Other (See Comments)    Hallucinations     Consultations: Vascular surgery   Procedures/Studies:  No results found.   Discharge Exam: Vitals:   11/21/19 1030 11/21/19 1100  BP: (!) 113/99 (!) 126/32  Pulse: 73 78  Resp:    Temp:    SpO2:     Vitals:   11/21/19 0930 11/21/19 1000 11/21/19 1030 11/21/19 1100  BP: (!) 129/43 (!) 125/36 (!) 113/99 (!) 126/32  Pulse: 75 76 73 78  Resp:      Temp:      TempSrc:      SpO2:      Weight:      Height:        General: Pt is alert, awake, not in acute distress Cardiovascular: RRR, S1/S2 +, no rubs, no gallops Respiratory: CTA bilaterally, no wheezing, no rhonchi Abdominal: Soft, NT, ND, bowel sounds + Extremities: Bilateral AKA    The results of significant diagnostics from this hospitalization (including imaging, microbiology, ancillary and laboratory) are listed below for reference.     Microbiology: Recent Results (from the past 240 hour(s))  SARS CORONAVIRUS 2 (TAT 6-24 HRS) Nasopharyngeal Nasopharyngeal  Swab     Status: None   Collection Time: 11/15/19  3:05 PM   Specimen: Nasopharyngeal Swab  Result Value Ref Range Status   SARS Coronavirus 2 NEGATIVE NEGATIVE Final    Comment: (NOTE) SARS-CoV-2 target nucleic acids are NOT DETECTED. The SARS-CoV-2 RNA is generally detectable in upper and lower respiratory specimens during the acute phase of infection. Negative results do not preclude SARS-CoV-2 infection, do not rule out co-infections with other pathogens, and should not be used as the sole basis for treatment or other patient management decisions. Negative results must be combined with clinical observations, patient history, and epidemiological information. The expected result is Negative. Fact Sheet for Patients: SugarRoll.be Fact Sheet for Healthcare Providers: https://www.woods-mathews.com/ This test is not yet approved or cleared by the [place] FDA and  has been authorized for detection and/or diagnosis of SARS-CoV-2 by FDA under an Emergency Use Authorization (EUA). This EUA will remain  in effect (meaning this test can be used) for the duration of the COVID-19 declaration under Section 56 4(b)(1) of the Act, 21 U.S.C. section 360bbb-3(b)(1), unless the authorization is terminated or revoked sooner. Performed at [facility], [place] [address], [place], [place] [address]   Surgical pcr screen     Status: None   Collection Time: 11/17/19  6:32 AM   Specimen: Nasal Mucosa; Nasal Swab  Result Value Ref Range Status   MRSA, PCR NEGATIVE NEGATIVE Final   Staphylococcus aureus NEGATIVE NEGATIVE Final    Comment: (NOTE) The Xpert SA Assay (FDA approved for NASAL specimens in patients 2 years of age and older), is one component of a comprehensive surveillance program. It is not intended to diagnose infection nor to guide or monitor treatment. Performed at [facility], [place] [address] [place], [place] [address]       Labs: BNP (last 3 results) No results for input(s): BNP in the last 8760 hours. Basic Metabolic Panel: Recent Labs  Lab 11/17/19 0626 11/17/19 0626 11/17/19 0735 11/18/19 0259 11/19/19 0333 11/20/19 0413 11/21/19 0511  NA 140   < > 140 140 136 138 137  K 3.7   < > 3.7 5.0 3.9 3.8 4.1  CL 98   < >  96* 97* 96* 98 98  CO2 29  --   --  26 28 28 24   GLUCOSE 99   < > 68* 109* 129* 110* 111*  BUN 29*   < > 28* 37* 16 35* 49*  CREATININE 5.79*   < > 5.70* 7.02* 4.42* 6.21* 8.03*  CALCIUM 8.5*  --   --  8.6* 8.5* 8.6* 8.6*  PHOS  --   --   --   --   --  5.7*  --    < > = values in this interval not displayed.   Liver Function Tests: Recent Labs  Lab 11/17/19 0626 11/20/19 0413  AST 43*  --   ALT 35  --   ALKPHOS 211*  --   BILITOT 0.8  --   PROT 6.5  --   ALBUMIN 2.7* 2.4*   No results for input(s): LIPASE, AMYLASE in the last 168 hours. No results for input(s): AMMONIA in the last 168 hours. CBC: Recent Labs  Lab 11/17/19 0626 11/17/19 0626 11/17/19 0735 11/18/19 0259 11/19/19 0333 11/20/19 0413 11/21/19 0511  WBC 8.0  --   --  9.6 9.4 8.3 8.3  NEUTROABS  --   --   --   --   --   --  5.3  HGB 8.8*   < > 10.9* 7.6* 7.2* 6.7* 8.1*  HCT 28.3*   < > 32.0* 24.1* 23.2* 21.2* 25.5*  MCV 97.9  --   --  96.8 98.3 96.8 96.2  PLT 262  --   --  309 236 241 251   < > = values in this interval not displayed.   Cardiac Enzymes: No results for input(s): CKTOTAL, CKMB, CKMBINDEX, TROPONINI in the last 168 hours. BNP: Invalid input(s): POCBNP CBG: Recent Labs  Lab 11/20/19 1141 11/20/19 1503 11/20/19 1725 11/20/19 2036 11/21/19 0642  GLUCAP 177* 198* 160* 179* 112*   D-Dimer No results for input(s): DDIMER in the last 72 hours. Hgb A1c No results for input(s): HGBA1C in the last 72 hours. Lipid Profile No results for input(s): CHOL, HDL, LDLCALC, TRIG, CHOLHDL, LDLDIRECT in the last 72 hours. Thyroid function studies No results for input(s): TSH, T4TOTAL,  T3FREE, THYROIDAB in the last 72 hours.  Invalid input(s): FREET3 Anemia work up No results for input(s): VITAMINB12, FOLATE, FERRITIN, TIBC, IRON, RETICCTPCT in the last 72 hours. Urinalysis    Component Value Date/Time   COLORURINE YELLOW 02/06/2018 1709   APPEARANCEUR HAZY (A) 02/06/2018 1709   LABSPEC 1.013 02/06/2018 1709   LABSPEC 1.025 02/13/2011 1533   PHURINE 8.0 02/06/2018 1709   GLUCOSEU 50 (A) 02/06/2018 1709   HGBUR NEGATIVE 02/06/2018 1709   BILIRUBINUR NEGATIVE 02/06/2018 1709   BILIRUBINUR Negative 02/13/2011 1533   KETONESUR NEGATIVE 02/06/2018 1709   PROTEINUR 100 (A) 02/06/2018 1709   UROBILINOGEN 0.2 04/28/2011 1045   NITRITE NEGATIVE 02/06/2018 1709   LEUKOCYTESUR NEGATIVE 02/06/2018 1709   LEUKOCYTESUR Negative 02/13/2011 1533   Sepsis Labs Invalid input(s): PROCALCITONIN,  WBC,  LACTICIDVEN Microbiology Recent Results (from the past 240 hour(s))  SARS CORONAVIRUS 2 (TAT 6-24 HRS) Nasopharyngeal Nasopharyngeal Swab     Status: None   Collection Time: 11/15/19  3:05 PM   Specimen: Nasopharyngeal Swab  Result Value Ref Range Status   SARS Coronavirus 2 NEGATIVE NEGATIVE Final    Comment: (NOTE) SARS-CoV-2 target nucleic acids are NOT DETECTED. The SARS-CoV-2 RNA is generally detectable in upper and lower respiratory specimens during the acute phase of infection. Negative  results do not preclude SARS-CoV-2 infection, do not rule out co-infections with other pathogens, and should not be used as the sole basis for treatment or other patient management decisions. Negative results must be combined with clinical observations, patient history, and epidemiological information. The expected result is Negative. Fact Sheet for Patients: SugarRoll.be Fact Sheet for Healthcare Providers: https://www.woods-mathews.com/ This test is not yet approved or cleared by the [place] FDA and  has been authorized for detection  and/or diagnosis of SARS-CoV-2 by FDA under an Emergency Use Authorization (EUA). This EUA will remain  in effect (meaning this test can be used) for the duration of the COVID-19 declaration under Section 56 4(b)(1) of the Act, 21 U.S.C. section 360bbb-3(b)(1), unless the authorization is terminated or revoked sooner. Performed at [facility], [place] [address], [place], [place] [address]   Surgical pcr screen     Status: None   Collection Time: 11/17/19  6:32 AM   Specimen: Nasal Mucosa; Nasal Swab  Result Value Ref Range Status   MRSA, PCR NEGATIVE NEGATIVE Final   Staphylococcus aureus NEGATIVE NEGATIVE Final    Comment: (NOTE) The Xpert SA Assay (FDA approved for NASAL specimens in patients 48 years of age and older), is one component of a comprehensive surveillance program. It is not intended to diagnose infection nor to guide or monitor treatment. Performed at [facility], [place] [address] [place], [place] [address]      Time coordinating discharge: Over 30 minutes  SIGNED:   Darliss Cheney, MD  [facility] 11/21/2019, 12:17 PM  If 7PM-7AM, please contact night-coverage [email or web address]

## 2019-11-21 NOTE — Discharge Instructions (Signed)
Leg Amputation, Care After This sheet gives you information about how to care for yourself after your procedure. Your health care provider may also give you more specific instructions. If you have problems or questions, contact your health care provider. What can I expect after the procedure? After the procedure, it is common to have:  A little blood or fluid coming from your incision.  Pain from your incision.  Pain that feels like it is coming from the leg that has been removed (phantom pain). This can last for a year or longer.  Skin breakdown on your stump (residual limb).  Feelings of depression, anxiety, and fear. Follow these instructions at home: Medicines  Take over-the-counter and prescription medicines only as told by your health care provider.  If you were prescribed an antibiotic medicine, take it as told by your health care provider. Do not stop taking the antibiotic even if you start to feel better. Bathing  Do not take baths, swim, use a hot tub, or get your residual limb wet until your health care provider approves. You may only be allowed to take sponge baths.  Ask your health care provider when you may start taking showers. After taking a shower, make sure to rinse and dry your residual limb carefully. Incision care   Check your residual limb, especially your incision area, every day. Check for: ? More redness, swelling, or pain. ? More fluid or blood. ? Warmth. ? Pus or a bad smell. ? Blisters. ? Scrapes.  Follow instructions from your health care provider about how to take care of your incision. Make sure you: ? Wash your hands with soap and water before you change your bandage (dressing). If soap and water are not available, use hand sanitizer. ? Change your dressing as told by your health care provider. ? Leave stitches (sutures), skin glue, or adhesive strips in place. These skin closures may need to stay in place for 2 weeks or longer. If adhesive strip  edges start to loosen and curl up, you may trim the loose edges. Do not remove adhesive strips completely unless your health care provider tells you to do that. Activity  Return to your normal activities as told by your health care provider. Ask your health care provider what activities are safe for you.  Do physical therapy exercises as told by your health care provider.  If you have been fitted with an artificial leg (prosthesis) or have been given crutches, use them as told by your health care provider. Eating and drinking  Eat a healthy diet that includes whole grains, fruits and vegetables, low-fat dairy products, and lean proteins.  Drink enough fluid to keep your urine pale yellow. Driving  Work with an occupational therapist to learn new strategies for safe driving with an amputation.  Do not drive or use heavy equipment while taking prescription pain medicine. General instructions  To prevent or treat constipation while you are taking prescription pain medicine, your health care provider may recommend that you: ? Drink enough fluid to keep your urine pale yellow. ? Take over-the-counter or prescription medicines. ? Eat foods that are high in fiber, such as fresh fruits and vegetables, whole grains, and beans. ? Limit foods that are high in fat and processed sugars, such as fried and sweet foods.  Do not use oils, lotion, cream, or rubbing alcohol on the remaining part of your leg.  Wear compression stockings as told by your health care provider.  If you have trouble coping   with your amputation, contact your health care provider. Some feelings of depression, anxiety, or fear are normal after an amputation, but if you struggle with these feelings or if they get overwhelming, your provider may be able to recommend a therapist or support group to help you.  Do not use any products that contain nicotine or tobacco, such as cigarettes and e-cigarettes. These can delay bone healing.  If you need help quitting, ask your health care provider.  Keep all follow-up visits as told by your health care provider. This is important. Contact a health care provider if:  You have a fever.  You have more tenderness in your residual limb.  You have a rash or itchy skin.  You have a cough or chills and you feel achy and weak.  You have trouble coping with your amputation.  You have blisters or scrapes on your residual limb. Get help right away if:  You have severe pain in your residual limb.  You have more redness, swelling, or pain around your incision.  You have more fluid or blood coming from your incision.  Your incision feels warm to the touch, tender, and painful.  You have pus or a bad smell coming from your incision.  You feel light-headed and have shortness of breath.  You have blood-soaked bandages.  You cough up blood.  You have chest pain or pain when taking a deep breath or coughing. If you have these symptoms, do not drive yourself to the hospital. Call emergency services right away. If you ever feel like you may hurt yourself or others, or have thoughts about taking your own life, get help right away. You can go to your nearest emergency department or call:  Your local emergency services (911 in the [place]  A suicide crisis helpline, such as the National Suicide Prevention Lifeline at 1-800-273-8255. This is open 24 hours a day. Summary  After a leg amputation, you may have pain that feels like it is coming from the leg that was removed (phantom pain). This can last for a year or longer.  Follow instructions from your health care provider about how to take care of your incision.  Check your residual limb, especially your incision area, every day. More redness, swelling, or pain may be a sign of infection.  Contact your health care provider if you have trouble coping with your amputation. This information is not intended to replace advice given to  you by your health care provider. Make sure you discuss any questions you have with your health care provider. Document Revised: 10/15/2016 Document Reviewed: 10/15/2016 Elsevier Patient Education  2020 [employer]vier Inc.  

## 2019-11-22 ENCOUNTER — Telehealth: Payer: Self-pay | Admitting: Nephrology

## 2019-11-22 DIAGNOSIS — I5042 Chronic combined systolic (congestive) and diastolic (congestive) heart failure: Secondary | ICD-10-CM | POA: Diagnosis not present

## 2019-11-22 DIAGNOSIS — S91301D Unspecified open wound, right foot, subsequent encounter: Secondary | ICD-10-CM | POA: Diagnosis not present

## 2019-11-22 DIAGNOSIS — I132 Hypertensive heart and chronic kidney disease with heart failure and with stage 5 chronic kidney disease, or end stage renal disease: Secondary | ICD-10-CM | POA: Diagnosis not present

## 2019-11-22 DIAGNOSIS — E1140 Type 2 diabetes mellitus with diabetic neuropathy, unspecified: Secondary | ICD-10-CM | POA: Diagnosis not present

## 2019-11-22 DIAGNOSIS — Z4781 Encounter for orthopedic aftercare following surgical amputation: Secondary | ICD-10-CM | POA: Diagnosis not present

## 2019-11-22 DIAGNOSIS — E1122 Type 2 diabetes mellitus with diabetic chronic kidney disease: Secondary | ICD-10-CM | POA: Diagnosis not present

## 2019-11-22 NOTE — Telephone Encounter (Signed)
Transition of care contact from inpatient facility  Date of Discharge: 11/21/2019 Date of Contact: 11/22/19 Method of contact: phone Talked with: [name] - sister primary caregiver  Patient contact to discuss transition of care from recent inpatient hospitalization. Pateint was admitted to [facility] from 4/29 - 11/21/19 with the diagnosis of new AKA, ABLA related to surgery - transfusion 1 unit PRBC  Medication changes : None Patient will follow up at outpatient dialysis on 11/23/19  Other follow up needs: Being seen by [facility] RN - nurse present when I called. No new concerns.  Amalia Hailey, PA-C [facility] Pager:  [phone]

## 2019-11-23 DIAGNOSIS — N186 End stage renal disease: Secondary | ICD-10-CM | POA: Diagnosis not present

## 2019-11-23 DIAGNOSIS — D631 Anemia in chronic kidney disease: Secondary | ICD-10-CM | POA: Diagnosis not present

## 2019-11-23 DIAGNOSIS — Z992 Dependence on renal dialysis: Secondary | ICD-10-CM | POA: Diagnosis not present

## 2019-11-23 DIAGNOSIS — N2581 Secondary hyperparathyroidism of renal origin: Secondary | ICD-10-CM | POA: Diagnosis not present

## 2019-11-23 DIAGNOSIS — Z23 Encounter for immunization: Secondary | ICD-10-CM | POA: Diagnosis not present

## 2019-11-24 ENCOUNTER — Encounter: Payer: Self-pay | Admitting: *Deleted

## 2019-11-24 ENCOUNTER — Ambulatory Visit: Payer: Medicare Other | Admitting: Vascular Surgery

## 2019-11-24 ENCOUNTER — Other Ambulatory Visit: Payer: Self-pay | Admitting: *Deleted

## 2019-11-24 NOTE — Patient Outreach (Signed)
[place] [facility]) Care Management  11/24/2019  [name] Aug 12, 1950 [id number]   Referral received 11/22/2019 Initial Outreach 11/24/2019 Provider to completed transition of care F/u appointment pending  Telephone Assessment-Successful-Enrollment (Diabetes)  RN spoke with pt who provided permission to speak with her sister [name]). Introduced [facility] services and the purpose for today's call to both the pt and primary caregiver [name]). 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 States she is currently living with the pt due to her bilateral amputation and total blindness. Pt is independent with some of her care but will always need assistance. Further discussed [facility] services with availability of a social worker and pharmacy if needed in the future. Explained how [facility] can assist pt with better managing her diabetes through education and providing tools that will assist. Sister [name]) and pt receptive to enrolling into the [facility] program and services today.  Based upon the information gathered will enroll pt into the diabetes program and discuss goals and interventions for a plan of care that will assist pt in achieving these goals. Discussed the importance of why it is importance to eat health with simple carbs, protein and some fats to her diet however eliminating some food items that can be harmful if not controlled. Will further educate on A1c and why this is importance to maintain <6 if possible to void acute problems from occurring. Will also educate on diabetes in general to increase pt's knowledge base and mail printable material to assist further. Plan of care generated based upon today's discussion that caregiver/pt have agreed to and Pt's provider will be updated accordingly concerning pt's enrollment. Will offer to completed the initial assessment (receptive).    Plans: Will follow up monthly as requested with ongoing education and  assist with pt/caregiver in managing pt's diabetes.  [facility] CM Care Plan Problem One     Most Recent Value  Care Plan Problem One  Deficient knowledge related to  diabetes management  Role Documenting the Problem One  Care Management Telephonic Coordinator  Care Plan for Problem One  Active  [facility] Long Term Goal   Pt/caregiver will verbalize Hgb A1c reduction within the next 90 days.  [facility] Long Term Goal Start Date  11/24/19  Interventions for Problem One Long Term Goal  Will discuss diabetes and mail EMMI printable educational material to assist pt with meeting the discussed goals today. Will review this materil as needed for pt's reference in managing her ongoing care with the assistance of her family members. Will educate on why it is importance to reduce her A1c as close as possible to <6.  [facility] CM Short Term Goal #1   Pt/caregiver will identify two foods to avoid in a diabetic diet within the next 30 day.  [facility] CM Short Term Goal #1 Start Date  11/24/19  Interventions for Short Term Goal #1  Will discuss dietary foods to avoid and how these food affects her BS. Will discuss simple carbs, proteins and fats in generating a healhty diabetic diet.  [facility] CM Short Term Goal #2   Caregiver will monitor blood glucose and document all readings in the [facility] journal/calendar within the next 30 days for her providers to view.  [facility] CM Short Term Goal #2 Start Date  11/24/19  Interventions for Short Term Goal #2  Will send [facility] calendar and encouraged caregiver to document all readings in the calendar for pts providers to view due any virtual or office  visit in regulating pt's medications based upon her readings.      [name], RN Care Management Coordinator [place] Office [phone]

## 2019-11-25 DIAGNOSIS — Z992 Dependence on renal dialysis: Secondary | ICD-10-CM | POA: Diagnosis not present

## 2019-11-25 DIAGNOSIS — D631 Anemia in chronic kidney disease: Secondary | ICD-10-CM | POA: Diagnosis not present

## 2019-11-25 DIAGNOSIS — N186 End stage renal disease: Secondary | ICD-10-CM | POA: Diagnosis not present

## 2019-11-25 DIAGNOSIS — Z23 Encounter for immunization: Secondary | ICD-10-CM | POA: Diagnosis not present

## 2019-11-25 DIAGNOSIS — N2581 Secondary hyperparathyroidism of renal origin: Secondary | ICD-10-CM | POA: Diagnosis not present

## 2019-11-26 DIAGNOSIS — E1140 Type 2 diabetes mellitus with diabetic neuropathy, unspecified: Secondary | ICD-10-CM | POA: Diagnosis not present

## 2019-11-26 DIAGNOSIS — Z4781 Encounter for orthopedic aftercare following surgical amputation: Secondary | ICD-10-CM | POA: Diagnosis not present

## 2019-11-26 DIAGNOSIS — S91301D Unspecified open wound, right foot, subsequent encounter: Secondary | ICD-10-CM | POA: Diagnosis not present

## 2019-11-26 DIAGNOSIS — E1122 Type 2 diabetes mellitus with diabetic chronic kidney disease: Secondary | ICD-10-CM | POA: Diagnosis not present

## 2019-11-26 DIAGNOSIS — I5042 Chronic combined systolic (congestive) and diastolic (congestive) heart failure: Secondary | ICD-10-CM | POA: Diagnosis not present

## 2019-11-26 DIAGNOSIS — I132 Hypertensive heart and chronic kidney disease with heart failure and with stage 5 chronic kidney disease, or end stage renal disease: Secondary | ICD-10-CM | POA: Diagnosis not present

## 2019-11-28 DIAGNOSIS — D631 Anemia in chronic kidney disease: Secondary | ICD-10-CM | POA: Diagnosis not present

## 2019-11-28 DIAGNOSIS — N2581 Secondary hyperparathyroidism of renal origin: Secondary | ICD-10-CM | POA: Diagnosis not present

## 2019-11-28 DIAGNOSIS — Z23 Encounter for immunization: Secondary | ICD-10-CM | POA: Diagnosis not present

## 2019-11-28 DIAGNOSIS — N186 End stage renal disease: Secondary | ICD-10-CM | POA: Diagnosis not present

## 2019-11-28 DIAGNOSIS — Z992 Dependence on renal dialysis: Secondary | ICD-10-CM | POA: Diagnosis not present

## 2019-11-29 ENCOUNTER — Ambulatory Visit: Admit: 2019-11-29 | Payer: Medicare Other | Admitting: Vascular Surgery

## 2019-11-29 DIAGNOSIS — E1129 Type 2 diabetes mellitus with other diabetic kidney complication: Secondary | ICD-10-CM | POA: Diagnosis not present

## 2019-11-29 DIAGNOSIS — I509 Heart failure, unspecified: Secondary | ICD-10-CM | POA: Diagnosis not present

## 2019-11-29 DIAGNOSIS — N186 End stage renal disease: Secondary | ICD-10-CM | POA: Diagnosis not present

## 2019-11-29 DIAGNOSIS — D509 Iron deficiency anemia, unspecified: Secondary | ICD-10-CM | POA: Diagnosis not present

## 2019-11-29 DIAGNOSIS — F329 Major depressive disorder, single episode, unspecified: Secondary | ICD-10-CM | POA: Diagnosis not present

## 2019-11-29 DIAGNOSIS — H540X55 Blindness right eye category 5, blindness left eye category 5: Secondary | ICD-10-CM | POA: Diagnosis not present

## 2019-11-29 DIAGNOSIS — G8929 Other chronic pain: Secondary | ICD-10-CM | POA: Diagnosis not present

## 2019-11-29 DIAGNOSIS — Z89611 Acquired absence of right leg above knee: Secondary | ICD-10-CM | POA: Diagnosis not present

## 2019-11-29 SURGERY — INSERTION OF ARTERIOVENOUS (AV) GORE-TEX GRAFT ARM
Anesthesia: Monitor Anesthesia Care | Laterality: Left

## 2019-11-30 DIAGNOSIS — Z23 Encounter for immunization: Secondary | ICD-10-CM | POA: Diagnosis not present

## 2019-11-30 DIAGNOSIS — N2581 Secondary hyperparathyroidism of renal origin: Secondary | ICD-10-CM | POA: Diagnosis not present

## 2019-11-30 DIAGNOSIS — N186 End stage renal disease: Secondary | ICD-10-CM | POA: Diagnosis not present

## 2019-11-30 DIAGNOSIS — Z992 Dependence on renal dialysis: Secondary | ICD-10-CM | POA: Diagnosis not present

## 2019-11-30 DIAGNOSIS — D631 Anemia in chronic kidney disease: Secondary | ICD-10-CM | POA: Diagnosis not present

## 2019-12-01 DIAGNOSIS — E1140 Type 2 diabetes mellitus with diabetic neuropathy, unspecified: Secondary | ICD-10-CM | POA: Diagnosis not present

## 2019-12-01 DIAGNOSIS — E1122 Type 2 diabetes mellitus with diabetic chronic kidney disease: Secondary | ICD-10-CM | POA: Diagnosis not present

## 2019-12-01 DIAGNOSIS — I5042 Chronic combined systolic (congestive) and diastolic (congestive) heart failure: Secondary | ICD-10-CM | POA: Diagnosis not present

## 2019-12-01 DIAGNOSIS — Z4781 Encounter for orthopedic aftercare following surgical amputation: Secondary | ICD-10-CM | POA: Diagnosis not present

## 2019-12-01 DIAGNOSIS — S91301D Unspecified open wound, right foot, subsequent encounter: Secondary | ICD-10-CM | POA: Diagnosis not present

## 2019-12-01 DIAGNOSIS — I132 Hypertensive heart and chronic kidney disease with heart failure and with stage 5 chronic kidney disease, or end stage renal disease: Secondary | ICD-10-CM | POA: Diagnosis not present

## 2019-12-02 DIAGNOSIS — Z992 Dependence on renal dialysis: Secondary | ICD-10-CM | POA: Diagnosis not present

## 2019-12-02 DIAGNOSIS — N2581 Secondary hyperparathyroidism of renal origin: Secondary | ICD-10-CM | POA: Diagnosis not present

## 2019-12-02 DIAGNOSIS — N186 End stage renal disease: Secondary | ICD-10-CM | POA: Diagnosis not present

## 2019-12-02 DIAGNOSIS — D631 Anemia in chronic kidney disease: Secondary | ICD-10-CM | POA: Diagnosis not present

## 2019-12-02 DIAGNOSIS — Z23 Encounter for immunization: Secondary | ICD-10-CM | POA: Diagnosis not present

## 2019-12-03 DIAGNOSIS — Z4781 Encounter for orthopedic aftercare following surgical amputation: Secondary | ICD-10-CM | POA: Diagnosis not present

## 2019-12-05 ENCOUNTER — Other Ambulatory Visit: Payer: Self-pay

## 2019-12-05 ENCOUNTER — Emergency Department (HOSPITAL_COMMUNITY): Payer: Medicare Other

## 2019-12-05 ENCOUNTER — Emergency Department (HOSPITAL_COMMUNITY)
Admission: EM | Admit: 2019-12-05 | Discharge: 2019-12-05 | Disposition: A | Discharge status: Home / Self Care | Payer: Medicare Other | Attending: Emergency Medicine | Admitting: Emergency Medicine

## 2019-12-05 DIAGNOSIS — R079 Chest pain, unspecified: Secondary | ICD-10-CM | POA: Diagnosis not present

## 2019-12-05 DIAGNOSIS — Z7982 Long term (current) use of aspirin: Secondary | ICD-10-CM | POA: Insufficient documentation

## 2019-12-05 DIAGNOSIS — I5042 Chronic combined systolic (congestive) and diastolic (congestive) heart failure: Secondary | ICD-10-CM | POA: Insufficient documentation

## 2019-12-05 DIAGNOSIS — E1122 Type 2 diabetes mellitus with diabetic chronic kidney disease: Secondary | ICD-10-CM | POA: Diagnosis not present

## 2019-12-05 DIAGNOSIS — Z79899 Other long term (current) drug therapy: Secondary | ICD-10-CM | POA: Insufficient documentation

## 2019-12-05 DIAGNOSIS — Z992 Dependence on renal dialysis: Secondary | ICD-10-CM | POA: Insufficient documentation

## 2019-12-05 DIAGNOSIS — Z853 Personal history of malignant neoplasm of breast: Secondary | ICD-10-CM | POA: Insufficient documentation

## 2019-12-05 DIAGNOSIS — N186 End stage renal disease: Secondary | ICD-10-CM | POA: Diagnosis not present

## 2019-12-05 DIAGNOSIS — Z91040 Latex allergy status: Secondary | ICD-10-CM | POA: Insufficient documentation

## 2019-12-05 DIAGNOSIS — J189 Pneumonia, unspecified organism: Secondary | ICD-10-CM | POA: Diagnosis not present

## 2019-12-05 DIAGNOSIS — I132 Hypertensive heart and chronic kidney disease with heart failure and with stage 5 chronic kidney disease, or end stage renal disease: Secondary | ICD-10-CM | POA: Diagnosis not present

## 2019-12-05 DIAGNOSIS — R0902 Hypoxemia: Secondary | ICD-10-CM | POA: Diagnosis not present

## 2019-12-05 DIAGNOSIS — I959 Hypotension, unspecified: Secondary | ICD-10-CM | POA: Diagnosis not present

## 2019-12-05 DIAGNOSIS — Z7984 Long term (current) use of oral hypoglycemic drugs: Secondary | ICD-10-CM | POA: Diagnosis not present

## 2019-12-05 DIAGNOSIS — D631 Anemia in chronic kidney disease: Secondary | ICD-10-CM | POA: Diagnosis not present

## 2019-12-05 DIAGNOSIS — J45909 Unspecified asthma, uncomplicated: Secondary | ICD-10-CM | POA: Diagnosis not present

## 2019-12-05 DIAGNOSIS — N644 Mastodynia: Secondary | ICD-10-CM | POA: Diagnosis present

## 2019-12-05 DIAGNOSIS — N2581 Secondary hyperparathyroidism of renal origin: Secondary | ICD-10-CM | POA: Diagnosis not present

## 2019-12-05 DIAGNOSIS — R0789 Other chest pain: Secondary | ICD-10-CM | POA: Diagnosis not present

## 2019-12-05 DIAGNOSIS — Z23 Encounter for immunization: Secondary | ICD-10-CM | POA: Diagnosis not present

## 2019-12-05 LAB — CBC WITH DIFFERENTIAL/PLATELET
Abs Immature Granulocytes: 0.02 10*3/uL (ref 0.00–0.07)
Basophils Absolute: 0.1 10*3/uL (ref 0.0–0.1)
Basophils Relative: 1 %
Eosinophils Absolute: 0.3 10*3/uL (ref 0.0–0.5)
Eosinophils Relative: 3 %
HCT: 27 % — ABNORMAL LOW (ref 36.0–46.0)
Hemoglobin: 8.2 g/dL — ABNORMAL LOW (ref 12.0–15.0)
Immature Granulocytes: 0 %
Lymphocytes Relative: 19 %
Lymphs Abs: 1.8 10*3/uL (ref 0.7–4.0)
MCH: 30.7 pg (ref 26.0–34.0)
MCHC: 30.4 g/dL (ref 30.0–36.0)
MCV: 101.1 fL — ABNORMAL HIGH (ref 80.0–100.0)
Monocytes Absolute: 0.8 10*3/uL (ref 0.1–1.0)
Monocytes Relative: 8 %
Neutro Abs: 6.7 10*3/uL (ref 1.7–7.7)
Neutrophils Relative %: 69 %
Platelets: 328 10*3/uL (ref 150–400)
RBC: 2.67 MIL/uL — ABNORMAL LOW (ref 3.87–5.11)
RDW: 19.6 % — ABNORMAL HIGH (ref 11.5–15.5)
WBC: 9.7 10*3/uL (ref 4.0–10.5)
nRBC: 0 % (ref 0.0–0.2)

## 2019-12-05 LAB — COMPREHENSIVE METABOLIC PANEL
ALT: 26 U/L (ref 0–44)
AST: 32 U/L (ref 15–41)
Albumin: 2.7 g/dL — ABNORMAL LOW (ref 3.5–5.0)
Alkaline Phosphatase: 204 U/L — ABNORMAL HIGH (ref 38–126)
Anion gap: 16 — ABNORMAL HIGH (ref 5–15)
BUN: 23 mg/dL (ref 8–23)
CO2: 30 mmol/L (ref 22–32)
Calcium: 9 mg/dL (ref 8.9–10.3)
Chloride: 95 mmol/L — ABNORMAL LOW (ref 98–111)
Creatinine, Ser: 5.06 mg/dL — ABNORMAL HIGH (ref 0.44–1.00)
GFR calc Af Amer: 10 mL/min — ABNORMAL LOW (ref >60)
GFR calc non Af Amer: 8 mL/min — ABNORMAL LOW (ref >60)
Glucose, Bld: 119 mg/dL — ABNORMAL HIGH (ref 70–99)
Potassium: 4.9 mmol/L (ref 3.5–5.1)
Sodium: 141 mmol/L (ref 135–145)
Total Bilirubin: 1.2 mg/dL (ref 0.3–1.2)
Total Protein: 6.9 g/dL (ref 6.5–8.1)

## 2019-12-05 LAB — CBG MONITORING, ED: Glucose-Capillary: 111 mg/dL — ABNORMAL HIGH (ref 70–99)

## 2019-12-05 MED ORDER — LEVOFLOXACIN 750 MG PO TABS
750.0000 mg | ORAL_TABLET | Freq: Once | ORAL | Status: AC
  Administered 2019-12-05: 750 mg via ORAL
  Filled 2019-12-05: qty 1

## 2019-12-05 MED ORDER — LEVOFLOXACIN 500 MG PO TABS: 500.0000 mg | ORAL_TABLET | ORAL | 0 refills | Status: DC

## 2019-12-05 NOTE — ED Triage Notes (Signed)
Pt IS BLIND
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
## 2019-12-05 NOTE — ED Provider Notes (Signed)
[facility] EMERGENCY DEPARTMENT Provider Note   CSN: [id number] Arrival date & time: 12/05/19  1312     History Chief Complaint  Patient presents with   Breast Pain   Shoulder Pain   ESRD    [name] is a 69 y.o. 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  who presents to the emergency room via EMS today for right breast pain.Was recently admitted to the hospital discharged on 5/3 for right AKA due to nonhealing foot ulcer.  Today she states that her pain is under her right breast, is pleuritic.  States that it started a couple days after she was discharged from the hospital.  Also admits to a new productive cough that started at the same time.  Denies any fevers or chills.    Denies any hemoptysis.  Sister is present in the room who states that she takes care of her and has not noticed any fevers at home.  Denies any other symptoms-no chest pain, shortness of breath, nausea, vomiting, weakness, fatigue, back pain.  Patient states that the pain does not radiate anywhere.  Patient states that the pain is becoming worse over time.  Denies any masses on her breast, denies any nipple discharge.  Patient did have dialysis today. 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    Esophageal ulcer with bleeding    ESRD (end stage renal disease) ([place])    "suppose to start dialysis today" (06/15/2013)   Family history of adverse reaction to anesthesia    It took a while for pt  sister to wake from anesthesia   GERD (gastroesophageal reflux disease)    Glaucoma    blind in both eyes   Heart murmur    Mild AS, moderate MR, moderate TR 10/30/16 echo   Hx of adenomatous colonic polyps 04/07/2018   Hypertension    Myalgia 12/31/2011   Neuropathy 12/31/2011   PAD (peripheral artery disease) ([place])    nonviable tissue left lower extremity   Shortness of breath    "when she doesn't go to dialysis"   Type II diabetes mellitus ([place])    Type II    Patient Active Problem List   Diagnosis Date Noted   S/P AKA (above knee amputation), right ([place]) 11/17/2019   Infection of amputation stump, left lower extremity ([place]) 10/19/2019   PAD (peripheral artery disease) ([place]) 09/30/2019   Gangrene of foot ([place]) 08/30/2019   Other fatigue 02/28/2019   Finger pain, left 02/03/2019   Trigger ring finger of left hand 02/03/2019   Osteomyelitis, unspecified ([place]) 11/16/2018   Diabetic retinopathy associated with type 2 diabetes mellitus ([place]) 11/15/2018   Rheumatoid factor positive 11/15/2018   Anemia in ESRD (end-stage renal disease) ([place]) 05/06/2018   Asthma 05/06/2018   Hx of adenomatous colonic polyps 04/07/2018   Status post carpal tunnel release 12/10/2017   Carpal tunnel syndrome, left upper limb  11/10/2017   Carpal tunnel syndrome, right upper limb 11/10/2017   Bilateral hand numbness 09/28/2017   Dependence on renal dialysis ([place]) 09/11/2017   Abdominal discomfort, epigastric    Hypertension 07/06/2017   Chronic combined systolic and diastolic CHF (congestive heart failure) ([place]) 63/14/9702   Complicated migraine 63/78/5885   ESRD on dialysis ([place]) 06/04/2017   Blindness of both eyes 06/04/2017   Glaucoma 06/04/2017   Essential hypertension 10/27/2016   GERD (gastroesophageal reflux disease) 10/27/2016   Hypercalcemia 05/28/2015   Abnormal stress test 03/15/2015   Poor venous access 02/08/2015   Diarrhea, unspecified  11/02/2014   Fever, unspecified 11/02/2014   Pain, unspecified 11/02/2014   Pruritus, unspecified 11/02/2014   Shortness of breath 07/28/2014   Acquired absence of eye 03/31/2014   Coagulation defect, unspecified ([place]) 03/22/2014   Dysuria 02/14/2014   Encounter for immunization 01/03/2014   Iron deficiency anemia, unspecified 10/18/2013   Unspecified protein-calorie malnutrition ([place]) 02/77/4128   Complication of vascular dialysis catheter 06/21/2013   Fibromyalgia 06/21/2013   Personal history of breast cancer 06/21/2013   Personal history of other diseases of the musculoskeletal system and connective tissue 06/21/2013   Type 2 diabetes mellitus with diabetic polyneuropathy ([place]) 06/21/2013   Secondary hyperparathyroidism of renal origin ([employer]) 06/20/2013   Unspecified complication of cardiac and vascular prosthetic device, implant and graft, subsequent encounter 06/20/2013   Type II diabetes mellitus with renal manifestations ([place]) 06/15/2013   Neuropathy 12/31/2011   Breast cancer of upper-inner quadrant of left female breast ([place]) 06/27/2011    Past Surgical History:  Procedure Laterality Date   A/V FISTULAGRAM N/A 09/21/2018   Procedure: A/V FISTULAGRAM - Right Upper;  Surgeon: [name], MD;  Location: [facility];  Service: Cardiovascular;  Laterality: N/A;   ABDOMINAL AORTOGRAM N/A 11/30/2018   Procedure: ABDOMINAL AORTOGRAM;  Surgeon: [name], MD;  Location: [facility];  Service: Cardiovascular;  Laterality: N/A;   ABDOMINAL AORTOGRAM W/LOWER EXTREMITY Left 07/29/2019   Procedure: ABDOMINAL AORTOGRAM W/LOWER EXTREMITY;  Surgeon: [name], MD;  Location: [facility];  Service: Cardiovascular;  Laterality: Left;   ABDOMINAL HYSTERECTOMY     partial   AMPUTATION Left 08/30/2019   Procedure: AMPUTATION BELOW KNEE LEFT;  Surgeon: [name], MD;  Location: [facility];  Service: Vascular;  Laterality: Left;    AMPUTATION Left 10/21/2019   Procedure: Amputation Above Knee;  Surgeon: Rosetta Posner, MD;  Location: [facility];  Service: Vascular;  Laterality: Left;   AMPUTATION Right 11/17/2019   Procedure: AMPUTATION ABOVE KNEE, right leg;  Surgeon: Rosetta Posner, MD;  Location: [facility];  Service: Vascular;  Laterality: Right;   AV FISTULA PLACEMENT Right 06/06/2013   Procedure: ARTERIOVENOUS (AV) FISTULA CREATION-RIGHT BRACHIAL CEPHALIC;  Surgeon: Conrad Roseland, MD;  Location: [facility];  Service: Vascular;  Laterality: Right;   BREAST BIOPSY Left    BREAST LUMPECTOMY Left    "and took out some lymph nodes" (06/15/2013)   CARDIAC CATHETERIZATION     04/02/15 [facility]): no angiographic CAD, LVEF 40% with global hypokinesis (LHC done for + stress echo, EF 45% 11/28/14)   CARPAL TUNNEL RELEASE Left 11/26/2017   Procedure: LEFT CARPAL TUNNEL RELEASE;  Surgeon: [name], MD;  Location: [facility];  Service: Orthopedics;  Laterality: Left;   CATARACT EXTRACTION W/ ANTERIOR VITRECTOMY Bilateral    CESAREAN SECTION  1980   COLONOSCOPY     ESOPHAGOGASTRODUODENOSCOPY N/A 07/08/2017   Procedure: ESOPHAGOGASTRODUODENOSCOPY (EGD);  Surgeon: [name], MD;  Location: [facility] END[facility]COPY;  Service: Endoscopy;  Laterality: N/A;   ESOPHAGOGASTRODUODENOSCOPY (EGD) WITH PROPOFOL N/A 02/07/2018   Procedure: ESOPHAGOGASTRODUODENOSCOPY (EGD) WITH PROPOFOL;  Surgeon: [name], MD;  Location: [facility] ENDOSCOPY;  Service: Gastroenterology;  Laterality: N/A;  APC and clips placed   EYE SURGERY Bilateral    laser surgery   LOWER EXTREMITY ANGIOGRAPHY Bilateral 11/30/2018   Procedure: LOWER EXTREMITY ANGIOGRAPHY;  Surgeon: [name], MD;  Location: [facility];  Service: Cardiovascular;  Laterality: Bilateral;   PARS PLANA VITRECTOMY Right 05/05/2017   Procedure: PARS PLANA VITRECTOMY WITH 25 GAUGE; PARTIAL REMOVAL OF OIL; INFERIOR PERIPHERAL IRIDECTOMY, REFORM ANTERIOR CHAMBER RIGHT EYE;  Surgeon: [name], MD;  Location: [facility];  Service: Ophthalmology;  Laterality: Right;   PERIPHERAL VASCULAR BALLOON ANGIOPLASTY Right 09/21/2018   Procedure: PERIPHERAL VASCULAR BALLOON ANGIOPLASTY;  Surgeon: [name], MD;  Location: [facility];  Service: Cardiovascular;  Laterality: Right;  AV fistula   PERIPHERAL VASCULAR BALLOON ANGIOPLASTY Left 11/30/2018   Procedure: PERIPHERAL VASCULAR BALLOON ANGIOPLASTY;  Surgeon: [name], MD;  Location: [facility];  Service: Cardiovascular;  Laterality: Left;  Left Anterior Tibial Artery   REFRACTIVE SURGERY Bilateral    REMOVAL OF A DIALYSIS CATHETER Right 06/06/2013   Procedure: REMOVAL OF RIGHT MEDIPORT;  Surgeon: Conrad Millville, MD;  Location: [facility];  Service: Vascular;  Laterality: Right;   TONSILLECTOMY     UPPER GASTROINTESTINAL ENDOSCOPY     WOUND DEBRIDEMENT Left 09/30/2019   Procedure: IRRIGATION AND DEBRIDEMENT WOUND OF LEFT BELOW KNEE AMPUTATION;  Surgeon: [name], MD;  Location: [facility];  Service: Vascular;  Laterality: Left;     OB History   No obstetric history on file.     Family History  Problem Relation Age of Onset   Diabetes Mother    Hyperlipidemia Mother    Hypertension Mother    Hypertension Father    Diabetes Sister    Diabetes Brother    Hypertension Brother    Heart attack Brother    Kidney disease Brother    Colon cancer Neg Hx    Colon polyps Neg Hx    Esophageal cancer Neg Hx    Gallbladder disease Neg Hx    Rectal cancer Neg Hx    Stomach cancer Neg Hx     Social History   Tobacco Use   Smoking status: Never Smoker   Smokeless tobacco: Never Used  Substance Use Topics   Alcohol use: No   Drug use: No    Home Medications Prior to Admission medications   Medication Sig Start Date End Date Taking? Authorizing Provider  acetaminophen (TYLENOL) 500 MG tablet Take 1,000 mg by mouth 2 (two) times daily with a meal.     [provider]  albuterol  (PROVENTIL) (2.5 MG/3ML) 0.083% nebulizer solution Take 3 mLs (2.5 mg total) by nebulization every 6 (six) hours as needed for wheezing or shortness of breath. 10/30/16   Theodis Blaze, MD  albuterol (VENTOLIN HFA) 108 (90 Base) MCG/ACT inhaler Inhale 2 puffs into the lungs every 6 (six) hours as needed for wheezing or shortness of breath.     [provider]  Alcohol Swabs (B-D SINGLE USE SWABS REGULAR) PADS in the morning, at noon, and at bedtime.  03/08/19   [provider]  aspirin EC 81 MG tablet Take 81 mg by mouth daily.    [provider]  atropine 1 % ophthalmic solution Place 1 drop into the right eye 2 (two) times daily. 07/04/17   [provider]  B Complex-C-Folic Acid (RENA-VITE RX) 1 MG TABS Take 1 tablet by mouth daily with breakfast.  09/29/16   [provider]  BD PEN NEEDLE NANO 2ND GEN 32G X 4 MM MISC in the morning, at noon, and at bedtime.  03/03/19   [provider]  carvedilol (COREG) 25 MG tablet Take 0.5 tablets (12.5 mg total) by mouth 2 (two) times daily. 10/24/19 11/23/19  Alma Friendly, MD  clopidogrel (PLAVIX) 75 MG tablet TAKE 1 TABLET BY MOUTH EVERY DAY Patient not taking: Reported on 11/24/2019 11/21/19   Waynetta Sandy, MD  clopidogrel (PLAVIX) 75 MG tablet TAKE 1 TABLET BY MOUTH EVERY DAY Patient not taking: Reported on 11/24/2019 11/21/19   Waynetta Sandy, MD  darbepoetin (ARANESP) 200 MCG/0.4ML SOLN injection Inject 0.4 mLs (200 mcg total) into the vein every Wednesday with hemodialysis. 06/22/13   Geradine Girt, DO  diclofenac sodium (VOLTAREN) 1 % GEL Apply 2 g topically 4 (four) times daily as needed (pain).  04/28/19   [provider]  diphenhydrAMINE (BENADRYL) 25 MG tablet Take 25 mg by mouth in the morning, at noon, and at bedtime.     [provider]  docusate sodium (COLACE) 100 MG capsule Take 100 mg by mouth 2 (two) times daily with a meal.     [provider]   doxercalciferol (HECTOROL) 4 MCG/2ML injection Inject 0.5 mLs (1 mcg total) into the vein every Monday, Wednesday, and Friday with hemodialysis. 06/18/13   Geradine Girt, DO  DULoxetine (CYMBALTA) 30 MG capsule Take 30 mg by mouth daily.    [provider]  fluticasone (FLONASE) 50 MCG/ACT nasal spray Place 2 sprays into both nostrils daily as needed for allergies.  02/07/19   [provider]  gabapentin (NEURONTIN) 100 MG capsule Take 100 mg by mouth 2 (two) times daily.    [provider]  insulin aspart (NOVOLOG FLEXPEN) 100 UNIT/ML FlexPen Inject 2-10 Units into the skin 2 (two) times daily with a meal.     [provider]  lanthanum (FOSRENOL) 1000 MG chewable tablet Chew 2,000-4,000 mg by mouth See admin instructions. Chew 4,000 mg by mouth with each meal and 2,000 mg with each snack    [provider]  levofloxacin (LEVAQUIN) 500 MG tablet Take 1 tablet (500 mg total) by mouth every other day for 10 days. 12/07/19 12/17/19  Alfredia Client, PA-C  lisinopril (ZESTRIL) 40 MG tablet Take 0.5 tablets (20 mg total) by mouth at bedtime. 10/24/19 11/23/19  Alma Friendly, MD  omeprazole (PRILOSEC) 40 MG capsule Take 1 capsule (40 mg total) by mouth daily. Patient taking differently: Take 40 mg by mouth 2 (two) times daily.  02/09/18   Cherene Altes, MD  [facility] VERIO test strip 1 each by Other route 3 (three) times daily.  12/15/17   [provider]  prednisoLONE acetate (PRED FORTE) 1 % ophthalmic suspension Place 1 drop into the right eye 2 (two) times daily.  04/27/17   [provider]  traMADol (ULTRAM) 50 MG tablet Take 1 tablet (50 mg total) by mouth every 6 (six) hours as needed. Patient taking differently: Take 25 mg by mouth 2 (two) times daily.  10/01/19   Ulyses Amor, PA-C    Allergies    Tape, Latex, Oxycodone, Tramadol, Morphine and related, and  Vicodin [hydrocodone-acetaminophen]  Review of Systems   Review of  Systems  Constitutional: Negative for chills, diaphoresis, fatigue and fever.  HENT: Negative for congestion, sore throat and trouble swallowing.   Eyes: Negative for pain and visual disturbance.  Respiratory: Negative for cough, shortness of breath and wheezing.   Cardiovascular: Negative for chest pain (R breast pain), palpitations and leg swelling.  Gastrointestinal: Negative for abdominal distention, abdominal pain, diarrhea, nausea and vomiting.  Genitourinary: Negative for difficulty urinating.  Musculoskeletal: Negative for back pain, neck pain and neck stiffness.       R breast pain  Skin: Negative for pallor.  Neurological: Negative for dizziness, speech difficulty, weakness and headaches.  Psychiatric/Behavioral: Negative for confusion.    Physical Exam Updated Vital Signs BP (!) 128/49    Pulse 87    Temp 98.7 F (37.1 C) (Oral)    Resp 18    LMP  (LMP Unknown)    SpO2 96%   Physical Exam Constitutional:      General: She is not in acute distress.    Appearance: Normal appearance. She is not ill-appearing, toxic-appearing or diaphoretic.     Comments: Patient is not in respiratory distress, patient is blind and double amputee.  Patient is resting comfortably in chair.  HENT:     Head: Normocephalic and atraumatic.     Nose: No congestion or rhinorrhea.     Mouth/Throat:     Mouth: Mucous membranes are moist. No oral lesions.     Pharynx: Oropharynx is clear. Uvula midline. No pharyngeal swelling, oropharyngeal exudate or uvula swelling.     Tonsils: No tonsillar exudate or tonsillar abscesses. 1+ on the right. 1+ on the left.  Eyes:     General: No scleral icterus.    Extraocular Movements: Extraocular movements intact.     Pupils: Pupils are equal, round, and reactive to light.  Cardiovascular:     Rate and Rhythm: Normal rate and regular rhythm.     Pulses: Normal pulses.     Heart sounds: Normal heart sounds.     Comments: Tenderness noted under right breast.   Tender to palpation under right breast, no masses palpated on right breast, no drainage.  No axillary lymphadenopathy.  Left side without any tenderness, normal left breast with no axillary lymphadenopathy either.  No tenderness to sternum. Pulmonary:     Effort: Pulmonary effort is normal. No respiratory distress.     Breath sounds: Normal breath sounds. No stridor. No wheezing, rhonchi or rales.  Chest:     Chest wall: No tenderness.  Abdominal:     General: Abdomen is flat. There is no distension.     Palpations: Abdomen is soft.     Tenderness: There is no abdominal tenderness. There is no guarding or rebound.  Musculoskeletal:        General: No swelling or tenderness. Normal range of motion.     Cervical back: Normal range of motion and neck supple. No rigidity.     Right lower leg: No edema.     Left lower leg: No edema.     Right Lower Extremity: Right leg is amputated above knee.     Left Lower Extremity: Left leg is amputated above knee.  Skin:    General: Skin is warm and dry.     Capillary Refill: Capillary refill takes less than 2 seconds.     Coloration: Skin is not pale.  Neurological:     General: No focal deficit present.  Mental Status: She is alert and oriented to person, place, and time. Mental status is at baseline.     Cranial Nerves: No cranial nerve deficit.     Sensory: No sensory deficit.     Motor: No weakness.     Coordination: Coordination normal.     Gait: Gait normal.     Deep Tendon Reflexes: Reflexes normal.  Psychiatric:        Mood and Affect: Mood normal.        Behavior: Behavior normal.     ED Results / Procedures / Treatments   Labs (all labs ordered are listed, but only abnormal results are displayed) Labs Reviewed  CBC WITH DIFFERENTIAL/PLATELET - Abnormal; Notable for the following components:      Result Value   RBC 2.67 (*)    Hemoglobin 8.2 (*)    HCT 27.0 (*)    MCV 101.1 (*)    RDW 19.6 (*)    All other components within  normal limits  CBG MONITORING, ED - Abnormal; Notable for the following components:   Glucose-Capillary 111 (*)    All other components within normal limits  COMPREHENSIVE METABOLIC PANEL    EKG None  Radiology DG Chest 2 View  Result Date: 12/05/2019 CLINICAL DATA:  Chest pain EXAM: CHEST - 2 VIEW COMPARISON:  May 06, 2018 chest radiograph; chest CT May 07 2018 FINDINGS: There is ill-defined opacity in the lateral right lung base. The lungs elsewhere are clear. Heart is enlarged with pulmonary vascularity normal. There is aortic atherosclerosis. No evident adenopathy. No bone lesions. IMPRESSION: Ill-defined opacity lateral right base consistent with focal pneumonia. Lungs elsewhere clear. There is cardiomegaly with pulmonary vascularity normal. There is extensive aortic atherosclerosis. No adenopathy. Electronically Signed   By: Lowella Grip III M.D.   On: 12/05/2019 14:21    Procedures Procedures (including critical care time)  Medications Ordered in ED Medications  levofloxacin (LEVAQUIN) tablet 750 mg (750 mg Oral Given 12/05/19 2026)    ED Course  I have reviewed the triage vital signs and the nursing notes.  Pertinent labs & imaging results that were available during my care of the patient were reviewed by me and considered in my medical decision making (see chart for details).    MDM Rules/Calculators/A&P                     MONET NORTH is a 69 y.o. 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  who presents to the emergency room via EMS today for right breast pain. Denies any chest pain on left side or radiation. Denies SOB. Pain is reproducible under right breast.  Do not think ACS work up is indicated at this time.    Chest x-ray suggestive of right lower lobe pneumonia.   Most likely hospital-acquired ammonia from recent admission.  Expressed the importance of coming into the hospital to get treated for this, and need for IV abx, however patient does not want to come into the hospital to get treated at this time. .9:01 PM Spoke to [place] from pharmacy who recommended 1 dose of Levaquin in the ED and then 5 days of outpatient Levaquin.  Discussed the risks of outpatient treatment versus inpatient treatment.  Patient would prefer to go home at this time on outpatient  Levaquin.  Expressed that she needs to come back if she has any worsening symptoms.  CBC and CMP stable. Hgb and Creatinine baseline. Alk phos baseline. Anion gap of 16, normal Co2. Glucose 119.   .Doubt need for further emergent work up at this time. I explained the diagnosis and have given explicit precautions to return to the ER including for any other new or worsening symptoms. The patient understands and accepts the medical plan as it's been dictated and I have answered their questions. Discharge instructions concerning home care and prescriptions have been given. The patient is STABLE and is discharged to home in good condition.  I discussed this case with my attending physician, Dr. Tyrone Nine, who cosigned this note including patient's presenting symptoms, physical exam, and planned diagnostics and interventions. Attending physician stated agreement with plan or made changes to plan which were implemented.   Attending physician assessed patient at bedside.   Final Clinical Impression(s) / ED Diagnoses Final diagnoses:  Healthcare-associated pneumonia    Rx / DC Orders ED Discharge Orders         Ordered    levofloxacin (LEVAQUIN) 500 MG tablet  Every other day     12/05/19 St[place]ard, [place], PA-C 12/06/19 Merwin, [place], DO 12/06/19 1351

## 2019-12-05 NOTE — Discharge Instructions (Addendum)
You were seen today for pneumonia.  This is most likely due to a healthcare associated pneumonia.  I want you to take your antibiotics as described by the pharmacist.  Make sure you take these on a full stomach.  Come back to the emergency department if you have any worsening symptoms-chest pain, shortness of breath, weakness, fever.

## 2019-12-05 NOTE — ED Notes (Signed)
Patient verbalizes understanding of discharge instructions. Opportunity for questioning and answers were provided. Armband removed by staff, pt discharged from ED via wheelchair.  

## 2019-12-07 DIAGNOSIS — N186 End stage renal disease: Secondary | ICD-10-CM | POA: Diagnosis not present

## 2019-12-07 DIAGNOSIS — Z23 Encounter for immunization: Secondary | ICD-10-CM | POA: Diagnosis not present

## 2019-12-07 DIAGNOSIS — D631 Anemia in chronic kidney disease: Secondary | ICD-10-CM | POA: Diagnosis not present

## 2019-12-07 DIAGNOSIS — Z992 Dependence on renal dialysis: Secondary | ICD-10-CM | POA: Diagnosis not present

## 2019-12-07 DIAGNOSIS — N2581 Secondary hyperparathyroidism of renal origin: Secondary | ICD-10-CM | POA: Diagnosis not present

## 2019-12-08 DIAGNOSIS — N186 End stage renal disease: Secondary | ICD-10-CM | POA: Diagnosis not present

## 2019-12-08 DIAGNOSIS — Z992 Dependence on renal dialysis: Secondary | ICD-10-CM | POA: Diagnosis not present

## 2019-12-08 DIAGNOSIS — D631 Anemia in chronic kidney disease: Secondary | ICD-10-CM | POA: Diagnosis not present

## 2019-12-08 DIAGNOSIS — Z23 Encounter for immunization: Secondary | ICD-10-CM | POA: Diagnosis not present

## 2019-12-08 DIAGNOSIS — N2581 Secondary hyperparathyroidism of renal origin: Secondary | ICD-10-CM | POA: Diagnosis not present

## 2019-12-09 ENCOUNTER — Telehealth: Payer: Self-pay

## 2019-12-09 DIAGNOSIS — N186 End stage renal disease: Secondary | ICD-10-CM | POA: Diagnosis not present

## 2019-12-09 DIAGNOSIS — D631 Anemia in chronic kidney disease: Secondary | ICD-10-CM | POA: Diagnosis not present

## 2019-12-09 DIAGNOSIS — N2581 Secondary hyperparathyroidism of renal origin: Secondary | ICD-10-CM | POA: Diagnosis not present

## 2019-12-09 DIAGNOSIS — Z992 Dependence on renal dialysis: Secondary | ICD-10-CM | POA: Diagnosis not present

## 2019-12-09 DIAGNOSIS — Z23 Encounter for immunization: Secondary | ICD-10-CM | POA: Diagnosis not present

## 2019-12-09 NOTE — Telephone Encounter (Signed)
Family member called to say that home health nurse told her to call today regarding staples that appeared to be embedded in the skin and incision site healing over the staples. She said that it is bleeding some and oozing, no foul odor and no fever. Wanted to get her in to have it checked. Advised that we could bring her in on Monday for the PA just to take a look at it and see if anything needs to be done.   Suki Crockett Mindi Junker

## 2019-12-12 ENCOUNTER — Ambulatory Visit (INDEPENDENT_AMBULATORY_CARE_PROVIDER_SITE_OTHER): Payer: Self-pay | Admitting: Physician Assistant

## 2019-12-12 ENCOUNTER — Other Ambulatory Visit: Payer: Self-pay

## 2019-12-12 VITALS — BP 88/47 | HR 85 | Temp 98.0°F | Resp 16 | Wt 164.0 lb

## 2019-12-12 DIAGNOSIS — N186 End stage renal disease: Secondary | ICD-10-CM | POA: Diagnosis not present

## 2019-12-12 DIAGNOSIS — Z992 Dependence on renal dialysis: Secondary | ICD-10-CM | POA: Diagnosis not present

## 2019-12-12 DIAGNOSIS — D631 Anemia in chronic kidney disease: Secondary | ICD-10-CM | POA: Diagnosis not present

## 2019-12-12 DIAGNOSIS — Z23 Encounter for immunization: Secondary | ICD-10-CM | POA: Diagnosis not present

## 2019-12-12 DIAGNOSIS — N2581 Secondary hyperparathyroidism of renal origin: Secondary | ICD-10-CM | POA: Diagnosis not present

## 2019-12-12 DIAGNOSIS — Z89611 Acquired absence of right leg above knee: Secondary | ICD-10-CM

## 2019-12-12 MED ORDER — TRAMADOL HCL 50 MG PO TABS: 50.0000 mg | ORAL_TABLET | Freq: Four times a day (QID) | ORAL | 0 refills | Status: DC | PRN

## 2019-12-12 NOTE — Progress Notes (Signed)
    Postoperative Visit    History of Present Illness   [name] is a 69 y.o. female who presents for postoperative follow-up for: right above-the-knee ampuation by Dr. Donnetta Hutching on 11/17/19.  Surgical history also significant for L AKA which has since healed.  She reports some drainage from mid R AKA incision.  She denies fevers, chills,,N/V.  She is also ESRD on dialysis on a MWF schedule.   For VQI Use Only   PRE-ADM LIVING: Home  AMB STATUS: Wheelchair   Physical Examination   Vitals:   12/12/19 1356  BP: (!) 88/47  Pulse: 85  Resp: 16  Temp: 98 F (36.7 C)  SpO2: 94%    RLE: mid AKA incision indent, unable to express any drainage today; no firm areas of fluid collection or areas of fluctuance; medial portion of incision healing well   Medical Decision Making   [name] is a 69 y.o. female who presents s/p right above-the-knee amputation.   Mid and lateral incision appears macerated however no areas of fluctuance, firmness, and no obvious sign of infection  I elected to not remove staples today as I believe there is a high risk for incision dehiscence if staples were removed today due to macerated skin along mid to lateral portion of incision  I will call her [facility] agency and discuss cleansing incision with soap and water daily and patting dry; incision should then be left open to air  I have also prescribed an additional #20 50mg  tramadol to be taken q6 hours  She will return next week for likely staple removal  Dagoberto Ligas PA-C [facility] Office: [facility] MD: Trula Slade

## 2019-12-13 ENCOUNTER — Encounter: Payer: Medicare Other | Admitting: Vascular Surgery

## 2019-12-13 ENCOUNTER — Other Ambulatory Visit: Payer: Self-pay | Admitting: Internal Medicine

## 2019-12-13 ENCOUNTER — Ambulatory Visit (HOSPITAL_COMMUNITY)
Admission: RE | Admit: 2019-12-13 | Discharge: 2019-12-13 | Disposition: A | Discharge status: Home / Self Care | Payer: Medicare Other | Source: Ambulatory Visit | Attending: Internal Medicine | Admitting: Internal Medicine

## 2019-12-13 ENCOUNTER — Encounter (HOSPITAL_COMMUNITY)
Admission: RE | Admit: 2019-12-13 | Discharge: 2019-12-13 | Disposition: A | Discharge status: Home / Self Care | Payer: Medicare Other | Source: Ambulatory Visit | Attending: Internal Medicine | Admitting: Internal Medicine

## 2019-12-13 DIAGNOSIS — N186 End stage renal disease: Secondary | ICD-10-CM | POA: Diagnosis not present

## 2019-12-13 DIAGNOSIS — D62 Acute posthemorrhagic anemia: Secondary | ICD-10-CM | POA: Diagnosis not present

## 2019-12-13 DIAGNOSIS — I5042 Chronic combined systolic (congestive) and diastolic (congestive) heart failure: Secondary | ICD-10-CM | POA: Diagnosis not present

## 2019-12-13 DIAGNOSIS — N644 Mastodynia: Secondary | ICD-10-CM

## 2019-12-13 DIAGNOSIS — R0602 Shortness of breath: Secondary | ICD-10-CM | POA: Diagnosis not present

## 2019-12-13 DIAGNOSIS — R0600 Dyspnea, unspecified: Secondary | ICD-10-CM

## 2019-12-13 DIAGNOSIS — K921 Melena: Secondary | ICD-10-CM | POA: Diagnosis not present

## 2019-12-13 DIAGNOSIS — K31811 Angiodysplasia of stomach and duodenum with bleeding: Secondary | ICD-10-CM | POA: Diagnosis not present

## 2019-12-13 DIAGNOSIS — K922 Gastrointestinal hemorrhage, unspecified: Secondary | ICD-10-CM | POA: Diagnosis not present

## 2019-12-13 DIAGNOSIS — I132 Hypertensive heart and chronic kidney disease with heart failure and with stage 5 chronic kidney disease, or end stage renal disease: Secondary | ICD-10-CM | POA: Diagnosis not present

## 2019-12-13 DIAGNOSIS — Z20822 Contact with and (suspected) exposure to covid-19: Secondary | ICD-10-CM | POA: Diagnosis not present

## 2019-12-13 DIAGNOSIS — I509 Heart failure, unspecified: Secondary | ICD-10-CM | POA: Diagnosis not present

## 2019-12-13 DIAGNOSIS — Z89611 Acquired absence of right leg above knee: Secondary | ICD-10-CM | POA: Diagnosis not present

## 2019-12-13 MED ORDER — TECHNETIUM TO 99M ALBUMIN AGGREGATED: 1.6000 | Freq: Once | INTRAVENOUS | Status: DC | PRN

## 2019-12-14 DIAGNOSIS — D631 Anemia in chronic kidney disease: Secondary | ICD-10-CM | POA: Diagnosis not present

## 2019-12-14 DIAGNOSIS — Z992 Dependence on renal dialysis: Secondary | ICD-10-CM | POA: Diagnosis not present

## 2019-12-14 DIAGNOSIS — N186 End stage renal disease: Secondary | ICD-10-CM | POA: Diagnosis not present

## 2019-12-14 DIAGNOSIS — Z23 Encounter for immunization: Secondary | ICD-10-CM | POA: Diagnosis not present

## 2019-12-14 DIAGNOSIS — N2581 Secondary hyperparathyroidism of renal origin: Secondary | ICD-10-CM | POA: Diagnosis not present

## 2019-12-16 ENCOUNTER — Encounter (HOSPITAL_COMMUNITY): Payer: Self-pay | Admitting: Emergency Medicine

## 2019-12-16 ENCOUNTER — Emergency Department (HOSPITAL_COMMUNITY): Payer: Medicare Other

## 2019-12-16 ENCOUNTER — Inpatient Hospital Stay (HOSPITAL_COMMUNITY)
Admission: EM | Admit: 2019-12-16 | Discharge: 2019-12-20 | DRG: 377 | Disposition: A | Discharge status: Home / Self Care | Payer: Medicare Other | Attending: Internal Medicine | Admitting: Internal Medicine

## 2019-12-16 ENCOUNTER — Other Ambulatory Visit: Payer: Self-pay

## 2019-12-16 DIAGNOSIS — D62 Acute posthemorrhagic anemia: Secondary | ICD-10-CM | POA: Diagnosis present

## 2019-12-16 DIAGNOSIS — F419 Anxiety disorder, unspecified: Secondary | ICD-10-CM | POA: Diagnosis present

## 2019-12-16 DIAGNOSIS — Z89612 Acquired absence of left leg above knee: Secondary | ICD-10-CM | POA: Diagnosis not present

## 2019-12-16 DIAGNOSIS — K31819 Angiodysplasia of stomach and duodenum without bleeding: Secondary | ICD-10-CM | POA: Diagnosis not present

## 2019-12-16 DIAGNOSIS — N2581 Secondary hyperparathyroidism of renal origin: Secondary | ICD-10-CM | POA: Diagnosis not present

## 2019-12-16 DIAGNOSIS — Z20822 Contact with and (suspected) exposure to covid-19: Secondary | ICD-10-CM | POA: Diagnosis not present

## 2019-12-16 DIAGNOSIS — Z79899 Other long term (current) drug therapy: Secondary | ICD-10-CM

## 2019-12-16 DIAGNOSIS — R7889 Finding of other specified substances, not normally found in blood: Secondary | ICD-10-CM | POA: Diagnosis not present

## 2019-12-16 DIAGNOSIS — D631 Anemia in chronic kidney disease: Secondary | ICD-10-CM | POA: Diagnosis present

## 2019-12-16 DIAGNOSIS — N250 Renal osteodystrophy: Secondary | ICD-10-CM | POA: Diagnosis not present

## 2019-12-16 DIAGNOSIS — Z992 Dependence on renal dialysis: Secondary | ICD-10-CM | POA: Diagnosis not present

## 2019-12-16 DIAGNOSIS — K922 Gastrointestinal hemorrhage, unspecified: Secondary | ICD-10-CM

## 2019-12-16 DIAGNOSIS — J189 Pneumonia, unspecified organism: Secondary | ICD-10-CM | POA: Diagnosis not present

## 2019-12-16 DIAGNOSIS — Z8711 Personal history of peptic ulcer disease: Secondary | ICD-10-CM

## 2019-12-16 DIAGNOSIS — K219 Gastro-esophageal reflux disease without esophagitis: Secondary | ICD-10-CM | POA: Diagnosis present

## 2019-12-16 DIAGNOSIS — Z91040 Latex allergy status: Secondary | ICD-10-CM | POA: Diagnosis not present

## 2019-12-16 DIAGNOSIS — E1151 Type 2 diabetes mellitus with diabetic peripheral angiopathy without gangrene: Secondary | ICD-10-CM | POA: Diagnosis present

## 2019-12-16 DIAGNOSIS — E1129 Type 2 diabetes mellitus with other diabetic kidney complication: Secondary | ICD-10-CM | POA: Diagnosis not present

## 2019-12-16 DIAGNOSIS — J45909 Unspecified asthma, uncomplicated: Secondary | ICD-10-CM | POA: Diagnosis present

## 2019-12-16 DIAGNOSIS — Z23 Encounter for immunization: Secondary | ICD-10-CM | POA: Diagnosis not present

## 2019-12-16 DIAGNOSIS — Z885 Allergy status to narcotic agent status: Secondary | ICD-10-CM

## 2019-12-16 DIAGNOSIS — E119 Type 2 diabetes mellitus without complications: Secondary | ICD-10-CM | POA: Diagnosis not present

## 2019-12-16 DIAGNOSIS — K5520 Angiodysplasia of colon without hemorrhage: Secondary | ICD-10-CM | POA: Diagnosis not present

## 2019-12-16 DIAGNOSIS — I440 Atrioventricular block, first degree: Secondary | ICD-10-CM | POA: Diagnosis not present

## 2019-12-16 DIAGNOSIS — Z89611 Acquired absence of right leg above knee: Secondary | ICD-10-CM | POA: Diagnosis not present

## 2019-12-16 DIAGNOSIS — Z833 Family history of diabetes mellitus: Secondary | ICD-10-CM | POA: Diagnosis not present

## 2019-12-16 DIAGNOSIS — I132 Hypertensive heart and chronic kidney disease with heart failure and with stage 5 chronic kidney disease, or end stage renal disease: Secondary | ICD-10-CM | POA: Diagnosis present

## 2019-12-16 DIAGNOSIS — D649 Anemia, unspecified: Secondary | ICD-10-CM | POA: Diagnosis not present

## 2019-12-16 DIAGNOSIS — E1142 Type 2 diabetes mellitus with diabetic polyneuropathy: Secondary | ICD-10-CM | POA: Diagnosis present

## 2019-12-16 DIAGNOSIS — H548 Legal blindness, as defined in USA: Secondary | ICD-10-CM | POA: Diagnosis present

## 2019-12-16 DIAGNOSIS — R0602 Shortness of breath: Secondary | ICD-10-CM | POA: Diagnosis not present

## 2019-12-16 DIAGNOSIS — E876 Hypokalemia: Secondary | ICD-10-CM | POA: Diagnosis not present

## 2019-12-16 DIAGNOSIS — Z9109 Other allergy status, other than to drugs and biological substances: Secondary | ICD-10-CM

## 2019-12-16 DIAGNOSIS — E1122 Type 2 diabetes mellitus with diabetic chronic kidney disease: Secondary | ICD-10-CM | POA: Diagnosis not present

## 2019-12-16 DIAGNOSIS — I739 Peripheral vascular disease, unspecified: Secondary | ICD-10-CM | POA: Diagnosis present

## 2019-12-16 DIAGNOSIS — I959 Hypotension, unspecified: Secondary | ICD-10-CM | POA: Diagnosis present

## 2019-12-16 DIAGNOSIS — I5042 Chronic combined systolic (congestive) and diastolic (congestive) heart failure: Secondary | ICD-10-CM | POA: Diagnosis present

## 2019-12-16 DIAGNOSIS — R195 Other fecal abnormalities: Secondary | ICD-10-CM | POA: Diagnosis not present

## 2019-12-16 DIAGNOSIS — Z8249 Family history of ischemic heart disease and other diseases of the circulatory system: Secondary | ICD-10-CM | POA: Diagnosis not present

## 2019-12-16 DIAGNOSIS — Z7982 Long term (current) use of aspirin: Secondary | ICD-10-CM

## 2019-12-16 DIAGNOSIS — K31811 Angiodysplasia of stomach and duodenum with bleeding: Principal | ICD-10-CM | POA: Diagnosis present

## 2019-12-16 DIAGNOSIS — E11319 Type 2 diabetes mellitus with unspecified diabetic retinopathy without macular edema: Secondary | ICD-10-CM | POA: Diagnosis present

## 2019-12-16 DIAGNOSIS — I120 Hypertensive chronic kidney disease with stage 5 chronic kidney disease or end stage renal disease: Secondary | ICD-10-CM | POA: Diagnosis not present

## 2019-12-16 DIAGNOSIS — N186 End stage renal disease: Secondary | ICD-10-CM | POA: Diagnosis not present

## 2019-12-16 DIAGNOSIS — R52 Pain, unspecified: Secondary | ICD-10-CM | POA: Diagnosis not present

## 2019-12-16 DIAGNOSIS — I998 Other disorder of circulatory system: Secondary | ICD-10-CM | POA: Diagnosis not present

## 2019-12-16 DIAGNOSIS — Z794 Long term (current) use of insulin: Secondary | ICD-10-CM

## 2019-12-16 DIAGNOSIS — K921 Melena: Secondary | ICD-10-CM | POA: Diagnosis not present

## 2019-12-16 DIAGNOSIS — R0902 Hypoxemia: Secondary | ICD-10-CM | POA: Diagnosis not present

## 2019-12-16 DIAGNOSIS — Z7902 Long term (current) use of antithrombotics/antiplatelets: Secondary | ICD-10-CM

## 2019-12-16 LAB — COMPREHENSIVE METABOLIC PANEL
ALT: 26 U/L (ref 0–44)
AST: 45 U/L — ABNORMAL HIGH (ref 15–41)
Albumin: 2.4 g/dL — ABNORMAL LOW (ref 3.5–5.0)
Alkaline Phosphatase: 188 U/L — ABNORMAL HIGH (ref 38–126)
Anion gap: 15 (ref 5–15)
BUN: 15 mg/dL (ref 8–23)
CO2: 28 mmol/L (ref 22–32)
Calcium: 8.3 mg/dL — ABNORMAL LOW (ref 8.9–10.3)
Chloride: 95 mmol/L — ABNORMAL LOW (ref 98–111)
Creatinine, Ser: 3.6 mg/dL — ABNORMAL HIGH (ref 0.44–1.00)
GFR calc Af Amer: 14 mL/min — ABNORMAL LOW (ref >60)
GFR calc non Af Amer: 12 mL/min — ABNORMAL LOW (ref >60)
Glucose, Bld: 136 mg/dL — ABNORMAL HIGH (ref 70–99)
Potassium: 3.4 mmol/L — ABNORMAL LOW (ref 3.5–5.1)
Sodium: 138 mmol/L (ref 135–145)
Total Bilirubin: 1 mg/dL (ref 0.3–1.2)
Total Protein: 5.7 g/dL — ABNORMAL LOW (ref 6.5–8.1)

## 2019-12-16 LAB — CBC
HCT: 19.1 % — ABNORMAL LOW (ref 36.0–46.0)
Hemoglobin: 5.8 g/dL — CL (ref 12.0–15.0)
MCH: 32.4 pg (ref 26.0–34.0)
MCHC: 30.4 g/dL (ref 30.0–36.0)
MCV: 106.7 fL — ABNORMAL HIGH (ref 80.0–100.0)
Platelets: 297 10*3/uL (ref 150–400)
RBC: 1.79 MIL/uL — ABNORMAL LOW (ref 3.87–5.11)
RDW: 22.6 % — ABNORMAL HIGH (ref 11.5–15.5)
WBC: 8.9 10*3/uL (ref 4.0–10.5)
nRBC: 0.6 % — ABNORMAL HIGH (ref 0.0–0.2)

## 2019-12-16 LAB — GLUCOSE, CAPILLARY: Glucose-Capillary: 149 mg/dL — ABNORMAL HIGH (ref 70–99)

## 2019-12-16 LAB — SARS CORONAVIRUS 2 BY RT PCR (HOSPITAL ORDER, PERFORMED IN ~~LOC~~ HOSPITAL LAB): SARS Coronavirus 2: NEGATIVE

## 2019-12-16 LAB — LIPASE, BLOOD: Lipase: 32 U/L (ref 11–51)

## 2019-12-16 LAB — PREPARE RBC (CROSSMATCH)

## 2019-12-16 LAB — POC OCCULT BLOOD, ED: Fecal Occult Bld: POSITIVE — AB

## 2019-12-16 MED ORDER — ALBUTEROL SULFATE (2.5 MG/3ML) 0.083% IN NEBU: 2.5000 mg | INHALATION_SOLUTION | RESPIRATORY_TRACT | Status: DC | PRN

## 2019-12-16 MED ORDER — SODIUM CHLORIDE 0.9 % IV BOLUS
250.0000 mL | Freq: Once | INTRAVENOUS | Status: AC
  Administered 2019-12-16: 250 mL via INTRAVENOUS

## 2019-12-16 MED ORDER — ONDANSETRON HCL 4 MG PO TABS: 4.0000 mg | ORAL_TABLET | Freq: Four times a day (QID) | ORAL | Status: DC | PRN

## 2019-12-16 MED ORDER — ONDANSETRON HCL 4 MG/2ML IJ SOLN
4.0000 mg | Freq: Four times a day (QID) | INTRAMUSCULAR | Status: DC | PRN
  Administered 2019-12-19: 4 mg via INTRAVENOUS
  Filled 2019-12-16: qty 2

## 2019-12-16 MED ORDER — POTASSIUM CHLORIDE CRYS ER 20 MEQ PO TBCR
20.0000 meq | EXTENDED_RELEASE_TABLET | ORAL | Status: AC
  Administered 2019-12-16: 20 meq via ORAL
  Filled 2019-12-16: qty 1

## 2019-12-16 MED ORDER — SODIUM CHLORIDE 0.9 % IV BOLUS
500.0000 mL | Freq: Once | INTRAVENOUS | Status: AC
  Administered 2019-12-16: 500 mL via INTRAVENOUS

## 2019-12-16 MED ORDER — PANTOPRAZOLE SODIUM 40 MG IV SOLR
40.0000 mg | Freq: Two times a day (BID) | INTRAVENOUS | Status: DC
  Administered 2019-12-17 – 2019-12-20 (×6): 40 mg via INTRAVENOUS
  Filled 2019-12-16 (×6): qty 40

## 2019-12-16 MED ORDER — PANTOPRAZOLE SODIUM 40 MG IV SOLR
40.0000 mg | Freq: Once | INTRAVENOUS | Status: AC
  Administered 2019-12-16: 40 mg via INTRAVENOUS
  Filled 2019-12-16: qty 40

## 2019-12-16 MED ORDER — INSULIN ASPART 100 UNIT/ML ~~LOC~~ SOLN
0.0000 [IU] | Freq: Three times a day (TID) | SUBCUTANEOUS | Status: DC
  Administered 2019-12-17 – 2019-12-20 (×2): 1 [IU] via SUBCUTANEOUS

## 2019-12-16 MED ORDER — SODIUM CHLORIDE 0.9% IV SOLUTION: Freq: Once | INTRAVENOUS | Status: AC

## 2019-12-16 NOTE — ED Provider Notes (Signed)
[facility] EMERGENCY DEPARTMENT Provider Note   CSN: [id number] Arrival date & time: 12/16/19  1641     History Chief Complaint  Patient presents with  . GI Bleeding    [name] is a 69 y.o. female with a past medical history significant for anemia, asthma, bilateral blindness, history of breast cancer, congestive heart failure, history of duodenal hemorrhage, history of esophageal ulcer, ESRD on dialysis Monday, Wednesday, and Friday, hypertension, PAD, and diabetes who presents to the ED via EMS due to hemoglobin of 5  Patient underwent a full dialysis treatment today and was found to have a hemoglobin of 5 in which they sent her to the ED for further evaluation. Patient admits to intermittent episodes of shortness of breath. Denies cough and fever. She is not on chronic oxygen therapy at home but notes she "sometimes uses it". She admits to melena today, but denies hematemesis and hematochezia.  Spoke to [facility] on the phone who notes she noticed the melena today.  Patient did not have a bowel movement yesterday, so [name] is unsure how long the melena has been present. She denies chest pain, but admits to right breast pain from where she has been lifted numerous times given she does not currently have a lift at home. She also admits to epigastric pain. Denies nausea, vomiting and diarrhea. Patient is currently on Plavix, but no other blood thinners. She takes Prilosec daily.   Patient sees Dr. Carlean Purl with [facility].   History obtained from patient, sister [name]), and past medical records. No interpreter used during encounter.      Past Medical History:  Diagnosis Date  . Anemia   . Anxiety   . Arthritis    "joints" (06/15/2013)  . Asthma   . 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 Blood transfusion without reported diagnosis   . Breast cancer ([facility])    left  . CHF (congestive heart failure) ([facility])   . 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 Esophageal ulcer with bleeding   . ESRD (end stage renal disease) ([facility])    "suppose to start dialysis today" (06/15/2013)  . Family history of adverse reaction to anesthesia    It took a while for pt sister to wake from anesthesia  . GERD (gastroesophageal reflux disease)   . Glaucoma    blind in both eyes  . Heart murmur    Mild AS, moderate MR, moderate TR 10/30/16 echo  . Hx of adenomatous colonic polyps 04/07/2018  . Hypertension   . Myalgia 12/31/2011  . Neuropathy 12/31/2011  . PAD (peripheral artery disease) ([facility])    nonviable tissue left lower extremity  . Shortness of breath    "when she doesn't go to dialysis"  . Type II diabetes mellitus ([place])    Type II    Patient Active Problem List   Diagnosis Date Noted  . S/P AKA (above knee amputation), right ([place]) 11/17/2019  . Infection of amputation stump, left lower extremity ([place]) 10/19/2019  . PAD (peripheral artery disease) ([place]) 09/30/2019  . Gangrene of foot ([place]) 08/30/2019  . Other fatigue 02/28/2019  . Finger pain, left 02/03/2019  . Trigger ring finger of left hand 02/03/2019  . Osteomyelitis, unspecified ([place]) 11/16/2018  . Diabetic retinopathy associated with type 2 diabetes mellitus ([place]) 11/15/2018  . Rheumatoid factor positive 11/15/2018  . Anemia in ESRD (end-stage renal disease) ([place]) 05/06/2018  . Asthma 05/06/2018  .  Hx of adenomatous colonic polyps 04/07/2018  . Status post carpal tunnel release 12/10/2017  . Carpal tunnel syndrome, left upper limb 11/10/2017  . Carpal tunnel syndrome, right upper limb 11/10/2017  . Bilateral hand numbness 09/28/2017  . Dependence on renal dialysis ([place]) 09/11/2017  . Abdominal discomfort, epigastric   . Hypertension 07/06/2017  . Chronic combined systolic and diastolic CHF (congestive heart failure) ([place]) 07/06/2017  . Complicated migraine 62/70/3500  . ESRD on dialysis ([place]) 06/04/2017  . Blindness of both eyes 06/04/2017  . Glaucoma  06/04/2017  . Essential hypertension 10/27/2016  . GERD (gastroesophageal reflux disease) 10/27/2016  . Hypercalcemia 05/28/2015  . Abnormal stress test 03/15/2015  . Poor venous access 02/08/2015  . Diarrhea, unspecified 11/02/2014  . Fever, unspecified 11/02/2014  . Pain, unspecified 11/02/2014  . Pruritus, unspecified 11/02/2014  . Shortness of breath 07/28/2014  . Acquired absence of eye 03/31/2014  . Coagulation defect, unspecified ([place]) 03/22/2014  . Dysuria 02/14/2014  . Encounter for immunization 01/03/2014  . Iron deficiency anemia, unspecified 10/18/2013  . Unspecified protein-calorie malnutrition ([place]) 09/23/2013  . Complication of vascular dialysis catheter 06/21/2013  . Fibromyalgia 06/21/2013  . Personal history of breast cancer 06/21/2013  . Personal history of other diseases of the musculoskeletal system and connective tissue 06/21/2013  . Type 2 diabetes mellitus with diabetic polyneuropathy ([place]) 06/21/2013  . Secondary hyperparathyroidism of renal origin ([place]) 06/20/2013  . Unspecified complication of cardiac and vascular prosthetic device, implant and graft, subsequent encounter 06/20/2013  . Type II diabetes mellitus with renal manifestations ([place]) 06/15/2013  . Neuropathy 12/31/2011  . Breast cancer of upper-inner quadrant of left female breast ([place]) 06/27/2011    Past Surgical History:  Procedure Laterality Date  . A/V FISTULAGRAM N/A 09/21/2018   Procedure: A/V FISTULAGRAM - Right Upper;  Surgeon: [name], MD;  Location: [facility];  Service: Cardiovascular;  Laterality: N/A;  . ABDOMINAL AORTOGRAM N/A 11/30/2018   Procedure: ABDOMINAL AORTOGRAM;  Surgeon: [name], MD;  Location: [facility];  Service: Cardiovascular;  Laterality: N/A;  . ABDOMINAL AORTOGRAM W/LOWER EXTREMITY Left 07/29/2019   Procedure: ABDOMINAL AORTOGRAM W/LOWER EXTREMITY;  Surgeon: [name], MD;  Location: [facility];  Service:  Cardiovascular;  Laterality: Left;  . ABDOMINAL HYSTERECTOMY     partial  . AMPUTATION Left 08/30/2019   Procedure: AMPUTATION BELOW KNEE LEFT;  Surgeon: [name], MD;  Location: [facility] OR;  Service: Vascular;  Laterality: Left;  . AMPUTATION Left 10/21/2019   Procedure: Amputation Above Knee;  Surgeon: Rosetta Posner, MD;  Location: [facility];  Service: Vascular;  Laterality: Left;  . AMPUTATION Right 11/17/2019   Procedure: AMPUTATION ABOVE KNEE, right leg;  Surgeon: Rosetta Posner, MD;  Location: [facility];  Service: Vascular;  Laterality: Right;  . AV FISTULA PLACEMENT Right 06/06/2013   Procedure: ARTERIOVENOUS (AV) FISTULA CREATION-RIGHT BRACHIAL CEPHALIC;  Surgeon: Conrad [place], MD;  Location: [facility];  Service: Vascular;  Laterality: Right;  . BREAST BIOPSY Left   . BREAST LUMPECTOMY Left    "and took out some lymph nodes" (06/15/2013)  . CARDIAC CATHETERIZATION     04/02/15 [facility]): no angiographic CAD, LVEF 40% with global hypokinesis (LHC done for + stress echo, EF 45% 11/28/14)  . CARPAL TUNNEL RELEASE Left 11/26/2017   Procedure: LEFT CARPAL TUNNEL RELEASE;  Surgeon: [name], MD;  Location: [facility];  Service: Orthopedics;  Laterality: Left;  . CATARACT EXTRACTION W/ ANTERIOR VITRECTOMY Bilateral   .  CESAREAN SECTION  1980  . COLONOSCOPY    . ESOPHAGOGASTRODUODENOSCOPY N/A 07/08/2017   Procedure: ESOPHAGOGASTRODUODENOSCOPY (EGD);  Surgeon: [name], MD;  Location: [facility] ENDOSCOPY;  Service: Endoscopy;  Laterality: N/A;  . ESOPHAGOGASTRODUODENOSCOPY (EGD) WITH PROPOFOL N/A 02/07/2018   Procedure: ESOPHAGOGASTRODUODENOSCOPY (EGD) WITH PROPOFOL;  Surgeon: [name], MD;  Location: [facility] ENDOSCOPY;  Service: Gastroenterology;  Laterality: N/A;  APC and clips placed  . EYE SURGERY Bilateral    laser surgery  . LOWER EXTREMITY ANGIOGRAPHY Bilateral 11/30/2018   Procedure: LOWER EXTREMITY ANGIOGRAPHY;  Surgeon: [name], MD;  Location: [facility];  Service:  Cardiovascular;  Laterality: Bilateral;  . PARS PLANA VITRECTOMY Right 05/05/2017   Procedure: PARS PLANA VITRECTOMY WITH 25 GAUGE; PARTIAL REMOVAL OF OIL; INFERIOR PERIPHERAL IRIDECTOMY, REFORM ANTERIOR CHAMBER RIGHT EYE;  Surgeon: [name], MD;  Location: [facility];  Service: Ophthalmology;  Laterality: Right;  . PERIPHERAL VASCULAR BALLOON ANGIOPLASTY Right 09/21/2018   Procedure: PERIPHERAL VASCULAR BALLOON ANGIOPLASTY;  Surgeon: [name], MD;  Location: [facility];  Service: Cardiovascular;  Laterality: Right;  AV fistula  . PERIPHERAL VASCULAR BALLOON ANGIOPLASTY Left 11/30/2018   Procedure: PERIPHERAL VASCULAR BALLOON ANGIOPLASTY;  Surgeon: [name], MD;  Location: [facility];  Service: Cardiovascular;  Laterality: Left;  Left Anterior Tibial Artery  . REFRACTIVE SURGERY Bilateral   . REMOVAL OF A DIALYSIS CATHETER Right 06/06/2013   Procedure: REMOVAL OF RIGHT MEDIPORT;  Surgeon: Conrad [place], MD;  Location: [facility];  Service: Vascular;  Laterality: Right;  . TONSILLECTOMY    . UPPER GASTROINTESTINAL ENDOSCOPY    . WOUND DEBRIDEMENT Left 09/30/2019   Procedure: IRRIGATION AND DEBRIDEMENT WOUND OF LEFT BELOW KNEE AMPUTATION;  Surgeon: [name], MD;  Location: [facility] OR;  Service: Vascular;  Laterality: Left;     OB History   No obstetric history on file.     Family History  Problem Relation Age of Onset  . Diabetes Mother   . Hyperlipidemia Mother   . Hypertension Mother   . Hypertension Father   . Diabetes Sister   . Diabetes Brother   . Hypertension Brother   . Heart attack Brother   . Kidney disease Brother   . Colon cancer Neg Hx   . Colon polyps Neg Hx   . Esophageal cancer Neg Hx   . Gallbladder disease Neg Hx   . Rectal cancer Neg Hx   . Stomach cancer Neg Hx     Social History   Tobacco Use  . Smoking status: Never Smoker  . Smokeless tobacco: Never Used  Substance Use Topics  . Alcohol use: No  . Drug use: No    Home  Medications Prior to Admission medications   Medication Sig Start Date End Date Taking? Authorizing Provider  acetaminophen (TYLENOL) 500 MG tablet Take 1,000 mg by mouth 2 (two) times daily with a meal.     [provider]  albuterol (PROVENTIL) (2.5 MG/3ML) 0.083% nebulizer solution Take 3 mLs (2.5 mg total) by nebulization every 6 (six) hours as needed for wheezing or shortness of breath. 10/30/16   Theodis Blaze, MD  albuterol (VENTOLIN HFA) 108 (90 Base) MCG/ACT inhaler Inhale 2 puffs into the lungs every 6 (six) hours as needed for wheezing or shortness of breath.     [provider]  Alcohol Swabs (B-D SINGLE USE SWABS REGULAR) PADS in the morning, at noon, and at bedtime.  03/08/19  [provider]  aspirin EC 81 MG tablet Take 81 mg by mouth daily.    [provider]  atropine 1 % ophthalmic solution Place 1 drop into the right eye 2 (two) times daily. 07/04/17   [provider]  B Complex-C-Folic Acid (RENA-VITE RX) 1 MG TABS Take 1 tablet by mouth daily with breakfast.  09/29/16   [provider]  BD PEN NEEDLE NANO 2ND GEN 32G X 4 MM MISC in the morning, at noon, and at bedtime.  03/03/19   [provider]  carvedilol (COREG) 25 MG tablet Take 0.5 tablets (12.5 mg total) by mouth 2 (two) times daily. 10/24/19 11/23/19  Alma Friendly, MD  clopidogrel (PLAVIX) 75 MG tablet TAKE 1 TABLET BY MOUTH EVERY DAY 11/21/19   Waynetta Sandy, MD  clopidogrel (PLAVIX) 75 MG tablet TAKE 1 TABLET BY MOUTH EVERY DAY 11/21/19   Waynetta Sandy, MD  darbepoetin [facility]) 200 MCG/0.4ML SOLN injection Inject 0.4 mLs (200 mcg total) into the vein every Wednesday with hemodialysis. 06/22/13   Geradine Girt, DO  diclofenac sodium (VOLTAREN) 1 % GEL Apply 2 g topically 4 (four) times daily as needed (pain).  04/28/19   [provider]  diphenhydrAMINE (BENADRYL) 25 MG tablet Take 25 mg by mouth in the morning, at noon, and  at bedtime.     [provider]  docusate sodium (COLACE) 100 MG capsule Take 100 mg by mouth 2 (two) times daily with a meal.     [provider]  doxercalciferol (HECTOROL) 4 MCG/2ML injection Inject 0.5 mLs (1 mcg total) into the vein every Monday, Wednesday, and Friday with hemodialysis. 06/18/13   Geradine Girt, DO  DULoxetine (CYMBALTA) 30 MG capsule Take 30 mg by mouth daily.    [provider]  fluticasone (FLONASE) 50 MCG/ACT nasal spray Place 2 sprays into both nostrils daily as needed for allergies.  02/07/19   [provider]  gabapentin (NEURONTIN) 100 MG capsule Take 100 mg by mouth 2 (two) times daily.    [provider]  insulin aspart (NOVOLOG FLEXPEN) 100 UNIT/ML FlexPen Inject 2-10 Units into the skin 2 (two) times daily with a meal.     [provider]  lanthanum (FOSRENOL) 1000 MG chewable tablet Chew 2,000-4,000 mg by mouth See admin instructions. Chew 4,000 mg by mouth with each meal and 2,000 mg with each snack    [provider]  levofloxacin (LEVAQUIN) 500 MG tablet Take 1 tablet (500 mg total) by mouth every other day for 10 days. 12/07/19 12/17/19  Alfredia Client, PA-C  lisinopril (ZESTRIL) 40 MG tablet Take 0.5 tablets (20 mg total) by mouth at bedtime. 10/24/19 11/23/19  Alma Friendly, MD  omeprazole (PRILOSEC) 40 MG capsule Take 1 capsule (40 mg total) by mouth daily. Patient taking differently: Take 40 mg by mouth 2 (two) times daily.  02/09/18   Cherene Altes, MD  [facility] VERIO test strip 1 each by Other route 3 (three) times daily.  12/15/17   [provider]  prednisoLONE acetate (PRED FORTE) 1 % ophthalmic suspension Place 1 drop into the right eye 2 (two) times daily.  04/27/17   [provider]  traMADol (ULTRAM) 50 MG tablet Take 1 tablet (50 mg total) by mouth every 6 (six) hours as needed. 12/12/19   Dagoberto Ligas, PA-C    Allergies    Tape, Latex, Oxycodone, Tramadol,  Morphine and related, and Vicodin [hydrocodone-acetaminophen]  Review of  Systems   Review of Systems  Constitutional: Negative for chills and fever.  Respiratory: Positive for shortness of breath. Negative for cough.   Cardiovascular: Negative for chest pain.  Gastrointestinal: Positive for abdominal pain (epigastric) and blood in stool (melena). Negative for diarrhea, nausea and vomiting.  Neurological: Negative for dizziness, speech difficulty, light-headedness and headaches.  All other systems reviewed and are negative.   Physical Exam Updated Vital Signs BP (!) 106/42   Pulse 69   Temp 98.9 F (37.2 C)   Resp 14   Wt 81.6 kg   LMP  (LMP Unknown)   SpO2 100%   BMI 29.95 kg/m   Physical Exam Vitals and nursing note reviewed. Exam conducted with a chaperone present.  Constitutional:      General: She is not in acute distress.    Appearance: She is not toxic-appearing.  HENT:     Head: Normocephalic.  Eyes:     Pupils: Pupils are equal, round, and reactive to light.     Comments: Blind in both eyes  Cardiovascular:     Rate and Rhythm: Normal rate and regular rhythm.     Pulses: Normal pulses.     Heart sounds: Normal heart sounds. No murmur. No friction rub. No gallop.   Pulmonary:     Effort: Pulmonary effort is normal.     Breath sounds: Normal breath sounds.  Chest:     Comments: Reproducible anterior chest wall tenderness.  No crepitus or deformity. Abdominal:     General: Abdomen is flat. Bowel sounds are normal. There is no distension.     Palpations: Abdomen is soft.     Tenderness: There is abdominal tenderness.     Comments: Epigastric tenderness without guarding or rebound.   Genitourinary:    Comments: No visible hemorrhoids or fissures. Fecal occult positive.  Musculoskeletal:     Cervical back: Neck supple.     Comments: Bilateral lower extremity amputations  Skin:    General: Skin is warm and dry.  Neurological:     General: No focal deficit  present.     Mental Status: She is alert.  Psychiatric:        Mood and Affect: Mood normal.        Behavior: Behavior normal.     ED Results / Procedures / Treatments   Labs (all labs ordered are listed, but only abnormal results are displayed) Labs Reviewed  COMPREHENSIVE METABOLIC PANEL - Abnormal; Notable for the following components:      Result Value   Potassium 3.4 (*)    Chloride 95 (*)    Glucose, Bld 136 (*)    Creatinine, Ser 3.60 (*)    Calcium 8.3 (*)    Total Protein 5.7 (*)    Albumin 2.4 (*)    AST 45 (*)    Alkaline Phosphatase 188 (*)    GFR calc non Af Amer 12 (*)    GFR calc Af Amer 14 (*)    All other components within normal limits  CBC - Abnormal; Notable for the following components:   RBC 1.79 (*)    Hemoglobin 5.8 (*)    HCT 19.1 (*)    MCV 106.7 (*)    RDW 22.6 (*)    nRBC 0.6 (*)    All other components within normal limits  POC OCCULT BLOOD, ED - Abnormal; Notable for the following components:   Fecal Occult Bld POSITIVE (*)    All other components within normal limits  SARS CORONAVIRUS 2 BY RT PCR (HOSPITAL ORDER, [facility])  LIPASE, BLOOD  TYPE AND SCREEN  PREPARE RBC (CROSSMATCH)    EKG None  Radiology DG Chest Portable 1 View  Result Date: 12/16/2019 CLINICAL DATA:  Shortness of breath. EXAM: PORTABLE CHEST 1 VIEW COMPARISON:  Dec 13, 2019 FINDINGS: There is stable cardiomegaly with vascular congestion and developing pulmonary edema. Advanced vascular calcifications are noted of the thoracic aorta. There is no pneumothorax or significant pleural effusion. There is no focal infiltrate. There is no acute osseous abnormality. IMPRESSION: Cardiomegaly with developing pulmonary edema. Electronically Signed   By: Constance Holster M.D.   On: 12/16/2019 18:16    Procedures .Critical Care Performed by: Suzy Bouchard, PA-C Authorized by: Suzy Bouchard, PA-C   Critical care provider statement:     Critical care time (minutes):  45   Critical care time was exclusive of:  Separately billable procedures and treating other patients and teaching time   Critical care was necessary to treat or prevent imminent or life-threatening deterioration of the following conditions:  Metabolic crisis and circulatory failure   Critical care was time spent personally by me on the following activities:  Discussions with consultants, evaluation of patient's response to treatment, examination of patient, ordering and performing treatments and interventions, ordering and review of laboratory studies, ordering and review of radiographic studies, pulse oximetry, re-evaluation of patient's condition, obtaining history from patient or surrogate and review of old charts   I assumed direction of critical care for this patient from another provider in my specialty: no     (including critical care time)  Medications Ordered in ED Medications  0.9 %  sodium chloride infusion (Manually program via Guardrails IV Fluids) (has no administration in time range)  sodium chloride 0.9 % bolus 500 mL (500 mLs Intravenous New Bag/Given 12/16/19 1824)  pantoprazole (PROTONIX) injection 40 mg (40 mg Intravenous Given 12/16/19 1824)    ED Course  I have reviewed the triage vital signs and the nursing notes.  Pertinent labs & imaging results that were available during my care of the patient were reviewed by me and considered in my medical decision making (see chart for details).  Clinical Course as of Dec 16 1851  Fri Dec 16, 2019  1718 Fecal Occult Blood, POC(!): POSITIVE [CA]  1742 Hemoglobin(!!): 5.8 [CA]  1749 Alkaline Phosphatase(!): 188 [CA]  1749 BP(!): 106/42 [CA]  1821 Discussed case with Dr. Rush Landmark with [facility] who agrees to follow patient. He requests patient to be on a clear liquid diet and NPO after midnight.    [CA]    Clinical Course User Index [CA] Karel Jarvis Dois [place]en[place]   MDM Rules/Calculators/A&P                      69 year old female presents to the ED via EMS due to a hemoglobin of 5 at dialysis earlier today. Patient has a history of upper GI bleed. Admits to melena today. Denies hematochezia and hematemesis. Upon arrival, patient afebrile, not tachycardic or hypoxic. Patient hypotensive. Patient given 556mL bolus while awaiting labs and IV protonix. Patient mentating normally. Abdomen soft, non-distended with epigastric tenderness.  Will obtain lipase, CBC, and CMP.  Rectal exam performed which is negative for any visible hemorrhoids or fissures.  Fecal occult positive.  CBC significant for anemia with hemoglobin at 5.8.  Baseline appears to be around 8. Patient consented to blood transfusion. Will start blood transfusion  and consult GI. COVID test ordered.  CMP significant for hypokalemia at 3.4, ending at 3.60 which appears better than baseline, normal BUN.  Chest x-ray personally reviewed which demonstrates cardiomegaly with developing pulmonary edema.  Discussed case with Dr. Rush Landmark with [facility]. See note above. Will consult hospitalist for medical admission. Spoke to Dr. Tamala Julian with [facility] who agrees to admit patient for further treatment.   Discussed case with Dr. Tamera Punt who evaluated patient at bedside and agrees with assessment and plan.  Final Clinical Impression(s) / ED Diagnoses Final diagnoses:  Acute GI bleeding    Rx / DC Orders ED Discharge Orders    None       Karie Kirks 12/16/19 2100    Malvin Johns, MD 12/16/19 2148

## 2019-12-16 NOTE — ED Notes (Signed)
Report attempted 

## 2019-12-16 NOTE — ED Notes (Signed)
Daughter arrived and per pt signed consent for blood

## 2019-12-16 NOTE — ED Triage Notes (Signed)
Pt BIB [facility] from home, per dialysis labs pt's hgb 5. Pt hypotensive with EMS, family reports pt has been having dark stools, but unsure for how long.

## 2019-12-16 NOTE — ED Notes (Signed)
Unable to contact [facility]. ED PA made aware.

## 2019-12-16 NOTE — H&P (Addendum)
History and Physical    [name] NOB:[id number] DOB: 13-Apr-1951 DOA: 12/16/2019  Referring MD/NP/PA: Charmaine Downs, PA-C Consultants:  [place] - vascular; [place] - nephrology PCP: Prince Solian, MD  Patient coming from: Home via EMS  Chief Complaint: Dizziness  I have personally briefly reviewed patient's old medical records in [place]   HPI: [name] is a 69 y.o. female with medical history significant of ESRD on HD, PAD s/p bilateral AKA, HTN, DM type II, legally blind, esophageal ulcer, and GI bleed presents with complaints of dizziness after hemoglobin noted to be around 5 hemodialysis today.  Patient had recently been treated for pneumonia with Levaquin and reported completed her last dose yesterday.  She was also given 1 dose of Diflucan for yeast infection and advised to take an secondary dose if needed.  Family members help take care of the patient at home and just noticed that she had dark stools today.  Patient does not use any NSAIDs, but is on Plavix and aspirin.  Last EGD.  Have been in 2017 after patient upper GI bleed secondary to single bleeding duodenal angioectasia that was treated with argon plasma coagulation and hemostatic clips.  ED Course: Upon admission into the emergency department patient was noted to be 71/59 with improvement IV fluids to 106/42.  Patient was never noted to be hypoxic but was placed on 3 L nasal cannula oxygen.  Labs significant for hemoglobin 5.8, potassium 3.4, BUN 15, and creatinine 3.6.  Stool guaiacs were noted to be positive.  Chest x-ray revealed cardiomegaly with developing pulmonary edema.  Patient was typed and screened and ordered 1 unit of packed red blood cells.  Patient had been given 500 mL of IV fluids, and 2 mg of Protonix IV.  [facility] consulted and recommended keeping n.p.o. after midnight. [facility] called to admit.  Review of Systems  Constitutional: Positive for malaise/fatigue.  HENT: Negative for ear  discharge and nosebleeds.   Eyes: Negative for photophobia and pain.       Positive for vision loss  Respiratory: Negative for shortness of breath.   Cardiovascular: Negative for chest pain and leg swelling.  Gastrointestinal: Positive for melena. Negative for abdominal pain, nausea and vomiting.  Genitourinary: Negative for dysuria and hematuria.  Neurological: Positive for dizziness. Negative for loss of consciousness.  Psychiatric/Behavioral: Negative for substance abuse.  All other systems reviewed and are negative.   Past Medical History:  Diagnosis Date  . Anemia   . Anxiety   . Arthritis    "joints" (06/15/2013)  . Asthma   . 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 Blood transfusion without reported diagnosis   . Breast cancer ([place])    left  . CHF (congestive heart failure) ([place])   . 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 Esophageal ulcer with bleeding   . ESRD (end stage renal disease) ([place])    "suppose to start dialysis today" (06/15/2013)  . Family history of adverse reaction to anesthesia    It took a while for pt sister to wake from anesthesia  . GERD (gastroesophageal reflux disease)   . Glaucoma    blind in both eyes  . Heart murmur    Mild AS, moderate MR, moderate TR 10/30/16 echo  . Hx of adenomatous colonic polyps 04/07/2018  . Hypertension   . Myalgia 12/31/2011  . Neuropathy 12/31/2011  . PAD (peripheral artery disease) ([facility])    nonviable tissue  left lower extremity  . Shortness of breath    "when she doesn't go to dialysis"  . Type II diabetes mellitus ([place])    Type II    Past Surgical History:  Procedure Laterality Date  . A/V FISTULAGRAM N/A 09/21/2018   Procedure: A/V FISTULAGRAM - Right Upper;  Surgeon: [name], MD;  Location: [facility];  Service: Cardiovascular;  Laterality: N/A;  . ABDOMINAL AORTOGRAM N/A 11/30/2018   Procedure: ABDOMINAL AORTOGRAM;  Surgeon: [name], MD;  Location: [facility];  Service: Cardiovascular;  Laterality: N/A;  . ABDOMINAL AORTOGRAM W/LOWER EXTREMITY Left 07/29/2019   Procedure: ABDOMINAL AORTOGRAM W/LOWER EXTREMITY;  Surgeon: [name], MD;  Location: [facility];  Service: Cardiovascular;  Laterality: Left;  . ABDOMINAL HYSTERECTOMY     partial  . AMPUTATION Left 08/30/2019   Procedure: AMPUTATION BELOW KNEE LEFT;  Surgeon: [name], MD;  Location: [facility];  Service: Vascular;  Laterality: Left;  . AMPUTATION Left 10/21/2019   Procedure: Amputation Above Knee;  Surgeon: Rosetta Posner, MD;  Location: [place];  Service: Vascular;  Laterality: Left;  . AMPUTATION Right 11/17/2019   Procedure: AMPUTATION ABOVE KNEE, right leg;  Surgeon: Rosetta Posner, MD;  Location: [facility] [place];  Service: Vascular;  Laterality: Right;  . AV FISTULA PLACEMENT Right 06/06/2013   Procedure: ARTERIOVENOUS (AV) FISTULA CREATION-RIGHT BRACHIAL CEPHALIC;  Surgeon: Conrad Milton, MD;  Location: [facility];  Service: Vascular;  Laterality: Right;  . BREAST BIOPSY Left   . BREAST LUMPECTOMY Left    "and took out some lymph nodes" (06/15/2013)  . CARDIAC CATHETERIZATION     04/02/15 [facility]): no angiographic CAD, LVEF 40% with global hypokinesis (LHC done for + stress echo, EF 45% 11/28/14)  . CARPAL TUNNEL RELEASE Left 11/26/2017   Procedure: LEFT CARPAL TUNNEL RELEASE;  Surgeon: [name], MD;  Location: [place];  Service: Orthopedics;  Laterality: Left;  . CATARACT EXTRACTION W/ ANTERIOR VITRECTOMY Bilateral   . CESAREAN SECTION  1980  . COLONOSCOPY    . ESOPHAGOGASTRODUODENOSCOPY N/A 07/08/2017   Procedure: ESOPHAGOGASTRODUODENOSCOPY (EGD);  Surgeon: [name], MD;  Location: [facility];  Service: Endoscopy;  Laterality: N/A;  . ESOPHAGOGASTRODUODENOSCOPY (EGD) WITH PROPOFOL N/A 02/07/2018   Procedure: ESOPHAGOGASTRODUODENOSCOPY (EGD) WITH PROPOFOL;  Surgeon: [name], MD;  Location: [facility] END[facility]COPY;  Service: Gastroenterology;  Laterality: N/A;  APC and  clips placed  . EYE SURGERY Bilateral    laser surgery  . LOWER EXTREMITY ANGIOGRAPHY Bilateral 11/30/2018   Procedure: LOWER EXTREMITY ANGIOGRAPHY;  Surgeon: [name], MD;  Location: [place] [facility];  Service: Cardiovascular;  Laterality: Bilateral;  . PARS PLANA VITRECTOMY Right 05/05/2017   Procedure: PARS PLANA VITRECTOMY WITH 25 GAUGE; PARTIAL REMOVAL OF OIL; INFERIOR PERIPHERAL IRIDECTOMY, REFORM ANTERIOR CHAMBER RIGHT EYE;  Surgeon: [name], MD;  Location: [place];  Service: Ophthalmology;  Laterality: Right;  . PERIPHERAL VASCULAR BALLOON ANGIOPLASTY Right 09/21/2018   Procedure: PERIPHERAL VASCULAR BALLOON ANGIOPLASTY;  Surgeon: [name], MD;  Location: [place] [facility];  Service: Cardiovascular;  Laterality: Right;  AV fistula  . PERIPHERAL VASCULAR BALLOON ANGIOPLASTY Left 11/30/2018   Procedure: PERIPHERAL VASCULAR BALLOON ANGIOPLASTY;  Surgeon: [name], MD;  Location: [place] [facility];  Service: Cardiovascular;  Laterality: Left;  Left Anterior Tibial Artery  . REFRACTIVE SURGERY Bilateral   . REMOVAL OF A DIALYSIS CATHETER Right 06/06/2013   Procedure: REMOVAL OF RIGHT  MEDIPORT;  Surgeon: Conrad Gregory, MD;  Location: [place];  Service: Vascular;  Laterality: Right;  . TONSILLECTOMY    . UPPER GASTROINTESTINAL ENDOSCOPY    . WOUND DEBRIDEMENT Left 09/30/2019   Procedure: IRRIGATION AND DEBRIDEMENT WOUND OF LEFT BELOW KNEE AMPUTATION;  Surgeon: [name], MD;  Location: [place];  Service: Vascular;  Laterality: Left;     reports that she has never smoked. She has never used smokeless tobacco. She reports that she does not drink alcohol or use drugs.  Allergies  Allergen Reactions  . Tape Itching and Rash    35M Transpore adhesive tape. Medical tape pulls off the skin!! PAPER TAPE ONLY, PLEASE  . Latex Hives  . Oxycodone Other (See Comments)    Hallucinations   . Tramadol Other (See Comments)    Hallucinations with a full tablet  .  Morphine And Related Other (See Comments)    Hallucinations   . Vicodin [Hydrocodone-Acetaminophen] Other (See Comments)    Hallucinations     Family History  Problem Relation Age of Onset  . Diabetes Mother   . Hyperlipidemia Mother   . Hypertension Mother   . Hypertension Father   . Diabetes Sister   . Diabetes Brother   . Hypertension Brother   . Heart attack Brother   . Kidney disease Brother   . Colon cancer Neg Hx   . Colon polyps Neg Hx   . Esophageal cancer Neg Hx   . Gallbladder disease Neg Hx   . Rectal cancer Neg Hx   . Stomach cancer Neg Hx     Prior to Admission medications   Medication Sig Start Date End Date Taking? Authorizing Provider  acetaminophen (TYLENOL) 500 MG tablet Take 1,000 mg by mouth 2 (two) times daily with a meal.     [provider]  albuterol (PROVENTIL) (2.5 MG/35ML) 0.083% nebulizer solution Take 3 mLs (2.5 mg total) by nebulization every 6 (six) hours as needed for wheezing or shortness of breath. 10/30/16   Theodis Blaze, MD  albuterol (VENTOLIN HFA) 108 (90 Base) MCG/ACT inhaler Inhale 2 puffs into the lungs every 6 (six) hours as needed for wheezing or shortness of breath.     [provider]  Alcohol Swabs (B-D SINGLE USE SWABS REGULAR) PADS in the morning, at noon, and at bedtime.  03/08/19   [provider]  aspirin EC 81 MG tablet Take 81 mg by mouth daily.    [provider]  atropine 1 % ophthalmic solution Place 1 drop into the right eye 2 (two) times daily. 07/04/17   [provider]  B Complex-C-Folic Acid (RENA-VITE RX) 1 MG TABS Take 1 tablet by mouth daily with breakfast.  09/29/16   [provider]  BD PEN NEEDLE NANO 2ND GEN 32G X 4 MM MISC in the morning, at noon, and at bedtime.  03/03/19   [provider]  carvedilol (COREG) 25 MG tablet Take 0.5 tablets (12.5 mg total) by mouth 2 (two) times daily. 10/24/19 11/23/19  Alma Friendly, MD  clopidogrel (PLAVIX) 75 MG  tablet TAKE 1 TABLET BY MOUTH EVERY DAY 11/21/19   Waynetta Sandy, MD  clopidogrel (PLAVIX) 75 MG tablet TAKE 1 TABLET BY MOUTH EVERY DAY 11/21/19   Waynetta Sandy, MD  darbepoetin [facility]) 200 MCG/0.4ML SOLN injection Inject 0.4 mLs (200 mcg total) into the vein every Wednesday with hemodialysis. 06/22/13   Geradine Girt, DO  diclofenac sodium (VOLTAREN) 1 %  GEL Apply 2 g topically 4 (four) times daily as needed (pain).  04/28/19   [provider]  diphenhydrAMINE (BENADRYL) 25 MG tablet Take 25 mg by mouth in the morning, at noon, and at bedtime.     [provider]  docusate sodium (COLACE) 100 MG capsule Take 100 mg by mouth 2 (two) times daily with a meal.     [provider]  doxercalciferol (HECTOROL) 4 MCG/2ML injection Inject 0.5 mLs (1 mcg total) into the vein every Monday, Wednesday, and Friday with hemodialysis. 06/18/13   Geradine Girt, DO  DULoxetine (CYMBALTA) 30 MG capsule Take 30 mg by mouth daily.    [provider]  fluticasone (FLONASE) 50 MCG/ACT nasal spray Place 2 sprays into both nostrils daily as needed for allergies.  02/07/19   [provider]  gabapentin (NEURONTIN) 100 MG capsule Take 100 mg by mouth 2 (two) times daily.    [provider]  insulin aspart (NOVOLOG FLEXPEN) 100 UNIT/ML FlexPen Inject 2-10 Units into the skin 2 (two) times daily with a meal.     [provider]  lanthanum (FOSRENOL) 1000 MG chewable tablet Chew 2,000-4,000 mg by mouth See admin instructions. Chew 4,000 mg by mouth with each meal and 2,000 mg with each snack    [provider]  levofloxacin (LEVAQUIN) 500 MG tablet Take 1 tablet (500 mg total) by mouth every other day for 10 days. 12/07/19 12/17/19  Alfredia Client, PA-C  lisinopril (ZESTRIL) 40 MG tablet Take 0.5 tablets (20 mg total) by mouth at bedtime. 10/24/19 11/23/19  Alma Friendly, MD  omeprazole (PRILOSEC) 40 MG capsule Take 1 capsule (40 mg  total) by mouth daily. Patient taking differently: Take 40 mg by mouth 2 (two) times daily.  02/09/18   Cherene Altes, MD  [facility] VERIO test strip 1 each by Other route 3 (three) times daily.  12/15/17   [provider]  prednisoLONE acetate (PRED FORTE) 1 % ophthalmic suspension Place 1 drop into the right eye 2 (two) times daily.  04/27/17   [provider]  traMADol (ULTRAM) 50 MG tablet Take 1 tablet (50 mg total) by mouth every 6 (six) hours as needed. 12/12/19   Dagoberto Ligas, PA-C    Physical Exam:  Constitutional: Chronically ill-appearing female NAD, calm, comfortable Vitals:   12/16/19 1715 12/16/19 1730 12/16/19 1745 12/16/19 1800  BP: (!) 71/59 (!) 80/31 (!) 96/37 (!) 106/42  Pulse:   69   Resp: 17 (!) 21 15 14   Temp:      SpO2:   100%   Weight:       Eyes: Legally blind ENMT: Mucous membranes are moist. Posterior pharynx clear of any exudate or lesions.  Neck: normal, supple, no masses, no thyromegaly Respiratory: clear to auscultation bilaterally, no wheezing, no crackles.  Currently on 3 L nasal cannula oxygen maintaining O2 saturations. Cardiovascular: Regular rate and rhythm, no murmurs / rubs / gallops. No extremity edema. 2+ pedal pulses. No carotid bruits.  Fistula present of the right upper extremity. Abdomen: no tenderness, no masses palpated. No hepatosplenomegaly. Bowel sounds positive.  Musculoskeletal: no clubbing / cyanosis.  Bilateral above-knee amputations Skin: no rashes, lesions, ulcers. No induration Neurologic: CN 2-12 grossly intact. Sensation intact, DTR normal. Strength 5/5 in all 4.  Psychiatric: Normal judgment and insight. Alert and oriented x 3. Normal mood.     Labs on Admission: I have personally reviewed following labs and imaging studies  CBC: Recent Labs  Lab 12/16/19 1652  WBC 8.9  HGB 5.8*  HCT 19.1*  MCV 106.7*  PLT 875   Basic Metabolic Panel: Recent Labs  Lab 12/16/19 1652  NA 138  K 3.4*  CL  95*  CO2 28  GLUCOSE 136*  BUN 15  CREATININE 3.60*  CALCIUM 8.3*   GFR: Estimated Creatinine Clearance: 16 mL/min (A) (by C-G formula based on SCr of 3.6 mg/dL (H)). Liver Function Tests: Recent Labs  Lab 12/16/19 1652  AST 45*  ALT 26  ALKPHOS 188*  BILITOT 1.0  PROT 5.7*  ALBUMIN 2.4*   No results for input(s): LIPASE, AMYLASE in the last 168 hours. No results for input(s): AMMONIA in the last 168 hours. Coagulation Profile: No results for input(s): INR, PROTIME in the last 168 hours. Cardiac Enzymes: No results for input(s): CKTOTAL, CKMB, CKMBINDEX, TROPONINI in the last 168 hours. BNP (last 3 results) No results for input(s): PROBNP in the last 8760 hours. HbA1C: No results for input(s): HGBA1C in the last 72 hours. CBG: No results for input(s): GLUCAP in the last 168 hours. Lipid Profile: No results for input(s): CHOL, HDL, LDLCALC, TRIG, CHOLHDL, LDLDIRECT in the last 72 hours. Thyroid Function Tests: No results for input(s): TSH, T4TOTAL, FREET4, T3FREE, THYROIDAB in the last 72 hours. Anemia Panel: No results for input(s): VITAMINB12, FOLATE, FERRITIN, TIBC, IRON, RETICCTPCT in the last 72 hours. Urine analysis:    Component Value Date/Time   COLORURINE YELLOW 02/06/2018 1709   APPEARANCEUR HAZY (A) 02/06/2018 1709   LABSPEC 1.013 02/06/2018 1709   LABSPEC 1.025 02/13/2011 1533   PHURINE 8.0 02/06/2018 1709   GLUCOSEU 50 (A) 02/06/2018 1709   HGBUR NEGATIVE 02/06/2018 1709   BILIRUBINUR NEGATIVE 02/06/2018 1709   BILIRUBINUR Negative 02/13/2011 1533   KETONESUR NEGATIVE 02/06/2018 1709   PROTEINUR 100 (A) 02/06/2018 1709   UROBILINOGEN 0.2 04/28/2011 1045   NITRITE NEGATIVE 02/06/2018 1709   LEUKOCYTESUR NEGATIVE 02/06/2018 1709   LEUKOCYTESUR Negative 02/13/2011 1533   Sepsis Labs: No results found for this or any previous visit (from the past 240 hour(s)).   Radiological Exams on Admission: DG Chest Portable 1 View  Result Date:  12/16/2019 CLINICAL DATA:  Shortness of breath. EXAM: PORTABLE CHEST 1 VIEW COMPARISON:  Dec 13, 2019 FINDINGS: There is stable cardiomegaly with vascular congestion and developing pulmonary edema. Advanced vascular calcifications are noted of the thoracic aorta. There is no pneumothorax or significant pleural effusion. There is no focal infiltrate. There is no acute osseous abnormality. IMPRESSION: Cardiomegaly with developing pulmonary edema. Electronically Signed   By: Constance Holster M.D.   On: 12/16/2019 18:16    EKG: Independently reviewed.  Sinus rhythm at 70 bpm with first-degree AV block  Assessment/Plan Acute blood loss anemia, GI bleed: Acute.  Patient presented after being found to have hemoglobin 5.8 with guaiac-positive stools and previously hemoglobin had been 8.2 on 5/17. Prior history of bleeding duodenal angioectasia in 2017. Patient temporarily delayed blood transfusion until daughter was able to sign consent.  -Admit to a medical telemetry bed -Clear liquid diet n.p.o. after midnight -Transfuse 1 unit packed red blood cells   -Continue to monitor and transfuse blood products as needed for symptomatic anemia or hemoglobin less than 7. -Appreciate GI consultative services, we will follow-up for any further recommendations  Hypotension: Acute.  Blood pressures initially noted as low as 71/59.  Pressures only minimally improved with 500 mL IV fluid bolus.  Blood products ordered 1 unit of packed red blood cells.  -  Hold home blood pressure medications of Coreg and lisinopril -Transfuse additional 250 mL of IV fluids as needed goal MAP greater than 65 -Transfuse packed red blood cells when able  ESRD on HD: Patient normally dialyzes Monday, Wednesday, and Friday.  She received a full hemodialysis treatment today.  Chest x-ray noted some signs of pulmonary. -Continuous pulse oximetry overnight -Dr. Justin Mend of nephrology consulted, follow-up for any further  recommendations  Hypokalemia: Acute.  Potassium just mildly low at 3.4. -Give 20 mEq of potassium chloride p.o. -Monitor and replace as needed  Diabetes mellitus type 2: Relatively well controlled as patient's last hemoglobin A1c on 10/19/2019. -Hypoglycemic protocols -Hold long-acting insulin as n.p.o. after midnight -CBGs before every meal  with very sensitive SSI  Peripheral arterial disease: Patient s/p bilateral AKA.Marland Kitchen  Appears to be on Plavix and aspirin. -Holding Plavix and aspirin due to acute bleed  GERD/history of bleeding duodenal ectasia -Protonix IV twice daily until able to tolerate p.o. following procedure  DVT prophylaxis: SCDs Code Status: Full Family Communication: Daughter updated at bedside Disposition Plan: Discharge home once medically stable Consults called: [facility]  Admission status: Inpatient   Norval Morton MD [facility] Pager [phone]   If 7PM-7AM, please contact night-coverage [email or web address] Password [facility]  12/16/2019, 6:45 PM

## 2019-12-16 NOTE — ED Notes (Signed)
Pt unable to sign consent for blood. Refuses to allow 2 RNs to sign consent on her behalf. Pt wants her daughter [name] to sign. [name] is not at the bedside at this time. Will attempt to contact daughter to come and sign consent.

## 2019-12-17 DIAGNOSIS — E119 Type 2 diabetes mellitus without complications: Secondary | ICD-10-CM

## 2019-12-17 DIAGNOSIS — D649 Anemia, unspecified: Secondary | ICD-10-CM

## 2019-12-17 DIAGNOSIS — Z89612 Acquired absence of left leg above knee: Secondary | ICD-10-CM

## 2019-12-17 DIAGNOSIS — Z89611 Acquired absence of right leg above knee: Secondary | ICD-10-CM

## 2019-12-17 DIAGNOSIS — R195 Other fecal abnormalities: Secondary | ICD-10-CM

## 2019-12-17 DIAGNOSIS — K5520 Angiodysplasia of colon without hemorrhage: Secondary | ICD-10-CM

## 2019-12-17 DIAGNOSIS — H548 Legal blindness, as defined in USA: Secondary | ICD-10-CM

## 2019-12-17 LAB — BASIC METABOLIC PANEL
Anion gap: 12 (ref 5–15)
BUN: 18 mg/dL (ref 8–23)
CO2: 29 mmol/L (ref 22–32)
Calcium: 8.3 mg/dL — ABNORMAL LOW (ref 8.9–10.3)
Chloride: 97 mmol/L — ABNORMAL LOW (ref 98–111)
Creatinine, Ser: 4.15 mg/dL — ABNORMAL HIGH (ref 0.44–1.00)
GFR calc Af Amer: 12 mL/min — ABNORMAL LOW (ref >60)
GFR calc non Af Amer: 10 mL/min — ABNORMAL LOW (ref >60)
Glucose, Bld: 116 mg/dL — ABNORMAL HIGH (ref 70–99)
Potassium: 3.9 mmol/L (ref 3.5–5.1)
Sodium: 138 mmol/L (ref 135–145)

## 2019-12-17 LAB — CBC
HCT: 21.3 % — ABNORMAL LOW (ref 36.0–46.0)
Hemoglobin: 6.7 g/dL — CL (ref 12.0–15.0)
MCH: 32.5 pg (ref 26.0–34.0)
MCHC: 31.5 g/dL (ref 30.0–36.0)
MCV: 103.4 fL — ABNORMAL HIGH (ref 80.0–100.0)
Platelets: 277 10*3/uL (ref 150–400)
RBC: 2.06 MIL/uL — ABNORMAL LOW (ref 3.87–5.11)
RDW: 22.7 % — ABNORMAL HIGH (ref 11.5–15.5)
WBC: 8 10*3/uL (ref 4.0–10.5)
nRBC: 0.3 % — ABNORMAL HIGH (ref 0.0–0.2)

## 2019-12-17 LAB — GLUCOSE, CAPILLARY
Glucose-Capillary: 96 mg/dL (ref 70–99)
Glucose-Capillary: 167 mg/dL — ABNORMAL HIGH (ref 70–99)
Glucose-Capillary: 107 mg/dL — ABNORMAL HIGH (ref 70–99)
Glucose-Capillary: 87 mg/dL (ref 70–99)

## 2019-12-17 LAB — PREPARE RBC (CROSSMATCH)

## 2019-12-17 LAB — HEMOGLOBIN AND HEMATOCRIT, BLOOD
HCT: 24.9 % — ABNORMAL LOW (ref 36.0–46.0)
HCT: 26.4 % — ABNORMAL LOW (ref 36.0–46.0)
Hemoglobin: 7.9 g/dL — ABNORMAL LOW (ref 12.0–15.0)
Hemoglobin: 8.3 g/dL — ABNORMAL LOW (ref 12.0–15.0)

## 2019-12-17 MED ORDER — SODIUM CHLORIDE 0.9% IV SOLUTION: Freq: Once | INTRAVENOUS | Status: AC

## 2019-12-17 NOTE — Progress Notes (Signed)
CRITICAL VALUE ALERT  Critical Value:  Hemoglobin 6.7  Date & Time Notied:  12/17/2019 @ 7564  Provider Notified:Blount MD  Orders Received/Actions taken:

## 2019-12-17 NOTE — Progress Notes (Signed)
PROGRESS NOTE    [name]  QMG:[id number] DOB: 12/25/1950 DOA: 12/16/2019 PCP: Prince Solian, MD    Chief Complaint  Patient presents with  . GI Bleeding    Brief Narrative:  [facility] from home, per dialysis labs pt's hgb 5. Pt hypotensive with EMS, family reports pt has been having dark stools, but unsure for how long.  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 -We will follow GI recommendation  ESRD on HD: Patient normally dialyzes Monday, Wednesday, and Friday.  She received a full hemodialysis treatment on Friday Nephrology notified  Hypokalemia, potassium 3.4, received 20 mEq supplement  Hypertension, presented with hypotension in the setting of GI bleed Hold home  blood pressure medication Coreg and lisinopril  Insulin-dependent type 2 diabetes, well controlled On sliding scale here  PVD status post bilateral AKA Hold aspirin Plavix in the setting of GI bleed  Legally blind  DVT prophylaxis: History of bilateral AKA, GI bleed Code Status: Full Family Communication: Patient Disposition:   Status is: Inpatient    Dispo: The patient is from: Home              Anticipated d/c is to: TBD              Anticipated d/c date is: TBD              Patient currently n.p.o., GI bleed  Consultants:   Nephrology  GI  Procedures:   None  Antimicrobials:   None     Objective: Vitals:   12/16/19 2300  12/17/19 0043 12/17/19 0351 12/17/19 0621  BP: (!) 101/38 (!) 115/40 (!) 107/32 (!) 118/44  Pulse: 68 73 71 72  Resp: 15 16 16 18   Temp:  98.4 F (36.9 C) 98.4 F (36.9 C) 98.5 F (36.9 C)  TempSrc:  Oral Oral Oral  SpO2: 93% 100% 100% 100%  Weight:        Intake/Output Summary (Last 24 hours) at 12/17/2019 0705 Last data filed at 12/16/2019 1945 Gross per 24 hour  Intake 315 ml  Output --  Net 315 ml   Filed Weights   12/16/19 1644  Weight: 81.6 kg    Examination:  General exam: calm, NAD, legally blind Respiratory system: Clear to auscultation. Respiratory effort normal. Cardiovascular system: S1 & S2 heard, RRR. No JVD, loud precordial murmur Gastrointestinal system: Abdomen is nondistended, soft and nontender. No organomegaly or masses felt. Normal bowel sounds heard. Central nervous system: Alert and oriented. No focal neurological deficits. Extremities: Status post bilateral AKA Skin: No rashes, lesions or ulcers Psychiatry: Judgement and insight appear normal. Mood & affect appropriate.     Data Reviewed: I have personally reviewed following labs and imaging studies  CBC: Recent Labs  Lab 12/16/19 1652 12/17/19 0446  WBC 8.9 8.0  HGB 5.8* 6.7*  HCT 19.1* 21.3*  MCV 106.7* 103.4*  PLT 297 326    Basic Metabolic Panel: Recent Labs  Lab 12/16/19 1652 12/17/19  0446  NA 138 138  K 3.4* 3.9  CL 95* 97*  CO2 28 29  GLUCOSE 136* 116*  BUN 15 18  CREATININE 3.60* 4.15*  CALCIUM 8.3* 8.3*    GFR: Estimated Creatinine Clearance: 13.9 mL/min (A) (by C-G formula based on SCr of 4.15 mg/dL (H)).  Liver Function Tests: Recent Labs  Lab 12/16/19 1652  AST 45*  ALT 26  ALKPHOS 188*  BILITOT 1.0  PROT 5.7*  ALBUMIN 2.4*    CBG: Recent Labs  Lab 12/16/19 2229 12/17/19 0608  GLUCAP 149* 96     Recent Results (from the past 240 hour(s))  SARS Coronavirus 2 by RT PCR (hospital order, performed in [facility] hospital lab) Nasopharyngeal  Nasopharyngeal Swab     Status: None   Collection Time: 12/16/19  7:10 PM   Specimen: Nasopharyngeal Swab  Result Value Ref Range Status   SARS Coronavirus 2 NEGATIVE NEGATIVE Final    Comment: (NOTE) SARS-CoV-2 target nucleic acids are NOT DETECTED. The SARS-CoV-2 RNA is generally detectable in upper and lower respiratory specimens during the acute phase of infection. The lowest concentration of SARS-CoV-2 viral copies this assay can detect is 250 copies / mL. A negative result does not preclude SARS-CoV-2 infection and should not be used as the sole basis for treatment or other patient management decisions.  A negative result may occur with improper specimen collection / handling, submission of specimen other than nasopharyngeal swab, presence of viral mutation(s) within the areas targeted by this assay, and inadequate number of viral copies (<250 copies / mL). A negative result must be combined with clinical observations, patient history, and epidemiological information. Fact Sheet for Patients:   StrictlyIdeas.no Fact Sheet for Healthcare Providers: BankingDealers.co.za This test is not yet approved or cleared  by the [place] FDA and has been authorized for detection and/or diagnosis of SARS-CoV-2 by FDA under an Emergency Use Authorization (EUA).  This EUA will remain in effect (meaning this test can be used) for the duration of the COVID-19 declaration under Section 564(b)(1) of the Act, 21 U.S.C. section 360bbb-3(b)(1), unless the authorization is terminated or revoked sooner. Performed at [facility], [place] [address] [place], [place] [address]          Radiology Studies: DG Chest Portable 1 View  Result Date: 12/16/2019 CLINICAL DATA:  Shortness of breath. EXAM: PORTABLE CHEST 1 VIEW COMPARISON:  Dec 13, 2019 FINDINGS: There is stable cardiomegaly with vascular congestion and developing pulmonary edema.  Advanced vascular calcifications are noted of the thoracic aorta. There is no pneumothorax or significant pleural effusion. There is no focal infiltrate. There is no acute osseous abnormality. IMPRESSION: Cardiomegaly with developing pulmonary edema. Electronically Signed   By: Constance Holster M.D.   On: 12/16/2019 18:16        Scheduled Meds: . insulin aspart  0-6 Units Subcutaneous TID WC  . pantoprazole (PROTONIX) IV  40 mg Intravenous Q12H   Continuous Infusions:   LOS: 1 day     Time spent: 90mins I have personally reviewed and interpreted on  12/17/2019 daily labs, tele strips, imagings as discussed above under date review session and assessment and plans.  I reviewed all nursing notes, pharmacy notes, consultant notes,  vitals, pertinent old records  I have discussed plan of care as described above with RN , patient  on 12/17/2019  Voice Recognition /Dragon dictation system was used to create this note, attempts have been made to correct errors. Please contact the  author with questions and/or clarifications.   Florencia Reasons, MD PhD FACP [facility]  Available via Epic secure chat 7am-7pm for nonurgent issues Please page for urgent issues To page the attending provider between 7A-7P or the covering provider during after hours 7P-7A, please log into the web site [email or web address] and access using universal [place] password for that web site. If you do not have the password, please call the hospital operator.    12/17/2019, 7:05 AM

## 2019-12-17 NOTE — Consult Note (Signed)
[facility] Renal Consultation Note  Indication for Consultation:  Management of ESRD/hemodialysis; anemia, hypertension/volume and secondary hyperparathyroidism  HPI: [name] is a 69 y.o. 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 Noted recnt admit for PNA , sister reports dark stools at home , denies NSAID , But is on ASA and Plavix, In ER  BP 71/59  Improved with IV fluids , HGB 5.8 , k 3.4 . Pos Stool guiac , CXR =cardiomegaly with developing pulmonary edema. / did  Receive 500 mL of IV fluids. [facility]  Seeing .      Past Medical History:  Diagnosis Date  . Anemia   . Anxiety   . Arthritis    "joints" (06/15/2013)  . Asthma   . 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 Blood transfusion without reported diagnosis   . Breast cancer ([place])    left  . CHF (congestive heart failure) ([place])   . 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 Esophageal ulcer with bleeding   . ESRD (end stage renal disease) ([place])    "suppose to start dialysis today" (06/15/2013)  . Family history of adverse reaction to anesthesia    It took a while for pt sister to wake from anesthesia  . GERD (gastroesophageal reflux disease)   . Glaucoma    blind in both eyes  . Heart murmur    Mild AS, moderate MR, moderate TR 10/30/16 echo  . Hx of adenomatous colonic polyps 04/07/2018  . Hypertension   . Myalgia 12/31/2011  . Neuropathy 12/31/2011  . PAD (peripheral artery disease) (HCC)    nonviable tissue left lower extremity  . Shortness of breath    "when she doesn't go to dialysis"  . Type II diabetes mellitus ([place])    Type II    Past Surgical  History:  Procedure Laterality Date  . A/V FISTULAGRAM N/A 09/21/2018   Procedure: A/V FISTULAGRAM - Right Upper;  Surgeon: [name], MD;  Location: [place] CV LAB;  Service: Cardiovascular;  Laterality: N/A;  . ABDOMINAL AORTOGRAM N/A 11/30/2018   Procedure: ABDOMINAL AORTOGRAM;  Surgeon: [name], MD;  Location: [place] [facility] LAB;  Service: Cardiovascular;  Laterality: N/A;  . ABDOMINAL AORTOGRAM W/LOWER EXTREMITY Left 07/29/2019   Procedure: ABDOMINAL AORTOGRAM W/LOWER EXTREMITY;  Surgeon: [name], MD;  Location: [place] CV LAB;  Service: Cardiovascular;  Laterality: Left;  . ABDOMINAL HYSTERECTOMY     partial  . AMPUTATION Left 08/30/2019   Procedure: AMPUTATION BELOW KNEE LEFT;  Surgeon: [name], MD;  Location: [facility] OR;  Service: Vascular;  Laterality: Left;  . AMPUTATION Left 10/21/2019   Procedure: Amputation Above Knee;  Surgeon: Rosetta Posner, MD;  Location: [place];  Service: Vascular;  Laterality: Left;  . AMPUTATION Right 11/17/2019   Procedure: AMPUTATION ABOVE KNEE, right leg;  Surgeon: Rosetta Posner, MD;  Location: [place];  Service: Vascular;  Laterality: Right;  . AV FISTULA PLACEMENT Right 06/06/2013   Procedure: ARTERIOVENOUS (AV) FISTULA CREATION-RIGHT BRACHIAL CEPHALIC;  Surgeon: Conrad [place], MD;  Location: [place];  Service: Vascular;  Laterality: Right;  .  BREAST BIOPSY Left   . BREAST LUMPECTOMY Left    "and took out some lymph nodes" (06/15/2013)  . CARDIAC CATHETERIZATION     04/02/15 [facility]): no angiographic CAD, LVEF 40% with global hypokinesis (LHC done for + stress echo, EF 45% 11/28/14)  . CARPAL TUNNEL RELEASE Left 11/26/2017   Procedure: LEFT CARPAL TUNNEL RELEASE;  Surgeon: [name], MD;  Location: [place];  Service: Orthopedics;  Laterality: Left;  . CATARACT EXTRACTION W/ ANTERIOR VITRECTOMY Bilateral   . CESAREAN SECTION  1980  . COLONOSCOPY    . ESOPHAGOGASTRODUODENOSCOPY N/A 07/08/2017   Procedure:  ESOPHAGOGASTRODUODENOSCOPY (EGD);  Surgeon: [name], MD;  Location: [facility] ENDOSCOPY;  Service: Endoscopy;  Laterality: N/A;  . ESOPHAGOGASTRODUODENOSCOPY (EGD) WITH PROPOFOL N/A 02/07/2018   Procedure: ESOPHAGOGASTRODUODENOSCOPY (EGD) WITH PROPOFOL;  Surgeon: [name], MD;  Location: [facility] ENDOSCOPY;  Service: Gastroenterology;  Laterality: N/A;  APC and clips placed  . EYE SURGERY Bilateral    laser surgery  . LOWER EXTREMITY ANGIOGRAPHY Bilateral 11/30/2018   Procedure: LOWER EXTREMITY ANGIOGRAPHY;  Surgeon: [name], MD;  Location: [place] CV LAB;  Service: Cardiovascular;  Laterality: Bilateral;  . PARS PLANA VITRECTOMY Right 05/05/2017   Procedure: PARS PLANA VITRECTOMY WITH 25 GAUGE; PARTIAL REMOVAL OF OIL; INFERIOR PERIPHERAL IRIDECTOMY, REFORM ANTERIOR CHAMBER RIGHT EYE;  Surgeon: [name], MD;  Location: [place];  Service: Ophthalmology;  Laterality: Right;  . PERIPHERAL VASCULAR BALLOON ANGIOPLASTY Right 09/21/2018   Procedure: PERIPHERAL VASCULAR BALLOON ANGIOPLASTY;  Surgeon: [name], MD;  Location: [place] CV LAB;  Service: Cardiovascular;  Laterality: Right;  AV fistula  . PERIPHERAL VASCULAR BALLOON ANGIOPLASTY Left 11/30/2018   Procedure: PERIPHERAL VASCULAR BALLOON ANGIOPLASTY;  Surgeon: [name], MD;  Location: [place] [facility] LAB;  Service: Cardiovascular;  Laterality: Left;  Left Anterior Tibial Artery  . REFRACTIVE SURGERY Bilateral   . REMOVAL OF A DIALYSIS CATHETER Right 06/06/2013   Procedure: REMOVAL OF RIGHT MEDIPORT;  Surgeon: Conrad [place], MD;  Location: [facility];  Service: Vascular;  Laterality: Right;  . TONSILLECTOMY    . UPPER GASTROINTESTINAL ENDOSCOPY    . WOUND DEBRIDEMENT Left 09/30/2019   Procedure: IRRIGATION AND DEBRIDEMENT WOUND OF LEFT BELOW KNEE AMPUTATION;  Surgeon: [name], MD;  Location: [facility] OR;  Service: Vascular;  Laterality: Left;      Family History  Problem Relation Age of Onset  . Diabetes Mother    . Hyperlipidemia Mother   . Hypertension Mother   . Hypertension Father   . Diabetes Sister   . Diabetes Brother   . Hypertension Brother   . Heart attack Brother   . Kidney disease Brother   . Colon cancer Neg Hx   . Colon polyps Neg Hx   . Esophageal cancer Neg Hx   . Gallbladder disease Neg Hx   . Rectal cancer Neg Hx   . Stomach cancer Neg Hx       reports that she has never smoked. She has never used smokeless tobacco. She reports that she does not drink alcohol or use drugs.   Allergies  Allergen Reactions  . Tape Itching and Rash    47M Transpore adhesive tape. Medical tape pulls off the skin!! PAPER TAPE ONLY, PLEASE  . Latex Hives  . Oxycodone Other (See Comments)    Hallucinations   . Tramadol Other (See Comments)    Hallucinations with a full tablet  . Morphine And Related Other (See Comments)  Hallucinations   . Vicodin [Hydrocodone-Acetaminophen] Other (See Comments)    Hallucinations     Prior to Admission medications   Medication Sig Start Date End Date Taking? Authorizing Provider  acetaminophen (TYLENOL) 500 MG tablet Take 1,000 mg by mouth 2 (two) times daily with a meal.    Yes [provider]  albuterol (PROVENTIL) (2.5 MG/3ML) 0.083% nebulizer solution Take 3 mLs (2.5 mg total) by nebulization every 6 (six) hours as needed for wheezing or shortness of breath. 10/30/16  Yes Theodis Blaze, MD  albuterol (VENTOLIN HFA) 108 (90 Base) MCG/ACT inhaler Inhale 2 puffs into the lungs every 6 (six) hours as needed for wheezing or shortness of breath.    Yes [provider]  Alcohol Swabs (B-D SINGLE USE SWABS REGULAR) PADS in the morning, at noon, and at bedtime.  03/08/19  Yes [provider]  aspirin EC 81 MG tablet Take 81 mg by mouth daily.   Yes [provider]  atropine 1 % ophthalmic solution Place 1 drop into the right eye 2 (two) times daily. 07/04/17  Yes [provider]  BD PEN NEEDLE NANO 2ND GEN 32G X 4  MM MISC in the morning, at noon, and at bedtime.  03/03/19  Yes [provider]  carvedilol (COREG) 25 MG tablet Take 0.5 tablets (12.5 mg total) by mouth 2 (two) times daily. 10/24/19 12/16/19 Yes Alma Friendly, MD  clopidogrel (PLAVIX) 75 MG tablet TAKE 1 TABLET BY MOUTH EVERY DAY Patient taking differently: Take 75 mg by mouth daily.  11/21/19  Yes Waynetta Sandy, MD  darbepoetin [facility]) 200 MCG/0.4ML SOLN injection Inject 0.4 mLs (200 mcg total) into the vein every Wednesday with hemodialysis. 06/22/13  Yes Eulogio Bear U, DO  diclofenac sodium (VOLTAREN) 1 % GEL Apply 2 g topically 4 (four) times daily as needed (pain).  04/28/19  Yes [provider]  diphenhydrAMINE (BENADRYL) 25 MG tablet Take 25 mg by mouth in the morning, at noon, and at bedtime.    Yes [provider]  docusate sodium (COLACE) 100 MG capsule Take 100 mg by mouth 2 (two) times daily with a meal.    Yes [provider]  doxercalciferol (HECTOROL) 4 MCG/2ML injection Inject 0.5 mLs (1 mcg total) into the vein every Monday, Wednesday, and Friday with hemodialysis. 06/18/13  Yes Vann, Jessica U, DO  DULoxetine (CYMBALTA) 30 MG capsule Take 30 mg by mouth daily.   Yes [provider]  fluticasone (FLONASE) 50 MCG/ACT nasal spray Place 2 sprays into both nostrils daily as needed for allergies.  02/07/19  Yes [provider]  gabapentin (NEURONTIN) 100 MG capsule Take 100 mg by mouth 2 (two) times daily.   Yes [provider]  insulin aspart (NOVOLOG FLEXPEN) 100 UNIT/ML FlexPen Inject 2-10 Units into the skin 2 (two) times daily with a meal.    Yes [provider]  lanthanum (FOSRENOL) 1000 MG chewable tablet Chew 2,000-4,000 mg by mouth See admin instructions. Chew 4,000 mg by mouth with each meal and 2,000 mg with each snack   Yes [provider]  lisinopril (ZESTRIL) 40 MG tablet Take 0.5 tablets (20 mg total) by mouth at bedtime. 10/24/19  12/16/19 Yes Alma Friendly, MD  omeprazole (PRILOSEC) 40 MG capsule Take 1 capsule (40 mg total) by mouth daily. Patient taking differently: Take 40 mg by mouth 2 (two) times daily.  02/09/18  Yes Cherene Altes, MD  [facility] VERIO test strip 1 each  by Other route 3 (three) times daily.  12/15/17  Yes [provider]  prednisoLONE acetate (PRED FORTE) 1 % ophthalmic suspension Place 1 drop into the right eye 2 (two) times daily.  04/27/17  Yes [provider]  traMADol (ULTRAM) 50 MG tablet Take 1 tablet (50 mg total) by mouth every 6 (six) hours as needed. Patient taking differently: Take 25 mg by mouth every 6 (six) hours as needed for moderate pain.  12/12/19  Yes Dagoberto Ligas, PA-C  clopidogrel (PLAVIX) 75 MG tablet TAKE 1 TABLET BY MOUTH EVERY DAY Patient not taking: No sig reported 11/21/19   Waynetta Sandy, MD  levofloxacin (LEVAQUIN) 500 MG tablet Take 1 tablet (500 mg total) by mouth every other day for 10 days. Patient not taking: Reported on 12/16/2019 12/07/19 12/17/19  Alfredia Client, PA-C     Anti-infectives (From admission, onward)   None      Ros = see above   Physical Exam: Vitals:   12/17/19 0621 12/17/19 0802  BP: (!) 118/44 (!) 110/55  Pulse: 72 76  Resp: 18 15  Temp: 98.5 F (36.9 C) 98.4 F (36.9 C)  SpO2: 100% 100%     General: alert in bed AAF NAD, Cooperative HEENT: [place] , [place] blind , MMdry Neck: no jvd , supple Heart: RRR, 2/6 sem , no rub  Lungs: CTA, unlabored breathing on 3l [place] o2 Abdomen: Obese, Bs pos, soft , Nd,[place] tender epigastrium , no ascites, no masses appreciated  Extremities: Bilat AKA , R stumps healed R with staples  Skin: warm dry no overt rash  Neuro: alert ox3, no acute focal deficits appreciated  Dialysis Access: pos bruit ,  Dialysis Orders: Center: [facility]  on mwf . EDW 73.5  HD Bath 2k, ca 2.25 Time 4hrs Heparin none. Access RUA AVF    Hectorol 9mcg mcg IV/HD Mircera 225 mcg iv q 2wks (  last on 12/12/19)        Assessment/Plan 1. Symptomatic lower Gi Bleed = HGB 5.8 , gi seeing  , no hep hd  Sp 1 unit  hgb 6.7  2. ESRD -  HD MWF, CXR concerning for Pulm edemaa nd leaving sl below edw as op, needs edw down today ,plan for  3 hr hd only 2 liter uf avoid hypotension  With GI bleed  3. Hypertension/volume  - as noted on admit low bp , home bp meds coreg, lisinopril hold , CXR  pulm edema  Hd as above  4. Anemia  Of ESRD and ABLA= esa just given  5. Metabolic bone disease -  binder when po diet , Hec on MWF hd  6. PAD = sp recent Bilat AKA this year. Holding Plavix  Per admit  7. DM Type 2 = per admit    Ernest Haber, PA-C [place] [phone] 12/17/2019, 8:11 AM

## 2019-12-17 NOTE — Consult Note (Signed)
Consultation  Referring Provider: Dr. Erlinda Hong    Primary Care Physician:  Prince Solian, MD Primary Gastroenterologist: Dr. Carlean Purl        Reason for Consultation: Anemia, melena             HPI:   [name] is a 69 y.o. female with a past medical history as listed below including blindness, CHF, ESRD on HD Monday Wednesday Friday, PAD status post bilateral AKI, diabetes type 2, esophageal ulcer and others, who presented to the hospital on 12/16/2019 with dizziness.  We are consulted in regards to finding of anemia and melena.    Today, the patient explains that her sister helps take care of her and noticed her having dark stools "but not black like blood" yesterday. Sister thought this was from the antibiotic (Levaquin) she was on for recent pneumonia couple of weeks ago, of which she took her last dose yesterday.  Patient describes some abdominal pain in her epigastrium and heartburn which breakthrough regardless of her antacids.  She is also had a decrease in appetite over the past 2 weeks.  Last took her Plavix yesterday.  Patient tells me that she is hungry.    Denies fever, chills or symptoms that awaken her from sleep.  ED course: BP 71/59, improved with IV fluids 106/42, was never noted to be hypoxic was placed on 3 L nasal cannula, hemoglobin 5.8--> 1 unit PRBCs 6.7, potassium 3.4, BUN 15 and creatinine 3.6.  Stool guaiac positive, chest x-ray with cardiomegaly and developing pulmonary edema, 1 unit PRBCs ordered  GI history: 03/25/2018 colonoscopy Dr. Carlean Purl: - Multiple 3 to 10 mm polyps in the descending colon and in the transverse colon, removed with a cold snare. Resected and retrieved. - One 1 to 2 mm polyp in the cecum, removed with a cold biopsy forceps. Resected and retrieved. - The examination was otherwise normal on direct and retroflexion views.  02/07/2018 EGD Dr. Hilarie Fredrickson: - Normal esophagus. - Normal stomach. - Blood in the entire examined duodenum. 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 Treated with argon plasma coagulation (APC) and hemostatic clip x 2.  07/08/2017 EGD Dr. Carlean Purl: - Bleeding esophageal ulcers. Clips (MR conditional) were placed. - Non-bleeding esophageal ulcer. Clip (MR conditional) was placed. ? Mallory Weiss tears vs other ulcers - The examination was otherwise normal. - No specimens collected.  Past Medical History:  Diagnosis Date  . Anemia   . Anxiety   . Arthritis    "joints" (06/15/2013)  . Asthma   . 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 Blood transfusion without reported diagnosis   . Breast cancer ([place])    left  . CHF (congestive heart failure) ([place])   . 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 Esophageal ulcer with bleeding   . ESRD (end stage renal disease) ([place])    "suppose to start dialysis today" (06/15/2013)  . Family history of adverse reaction to anesthesia    It took a while for pt sister to wake from anesthesia  . GERD (gastroesophageal reflux disease)   . Glaucoma    blind in both eyes  . Heart murmur    Mild AS, moderate MR, moderate TR 10/30/16 echo  . Hx of adenomatous colonic polyps 04/07/2018  . Hypertension   . Myalgia 12/31/2011  . Neuropathy 12/31/2011  . PAD (peripheral artery disease) ([facility])    nonviable tissue left lower extremity  .  Shortness of breath    "when she doesn't go to dialysis"  . Type II diabetes mellitus ([place])    Type II    Past Surgical History:  Procedure Laterality Date  . A/V FISTULAGRAM N/A 09/21/2018   Procedure: A/V FISTULAGRAM - Right Upper;  Surgeon: [name], MD;  Location: [place] [facility];  Service: Cardiovascular;  Laterality: N/A;  . ABDOMINAL AORTOGRAM N/A 11/30/2018   Procedure: ABDOMINAL AORTOGRAM;  Surgeon: [name], MD;  Location: [place] [facility];  Service: Cardiovascular;  Laterality: N/A;  . ABDOMINAL AORTOGRAM W/LOWER EXTREMITY Left 07/29/2019   Procedure: ABDOMINAL AORTOGRAM W/LOWER  EXTREMITY;  Surgeon: [name], MD;  Location: [place] [facility];  Service: Cardiovascular;  Laterality: Left;  . ABDOMINAL HYSTERECTOMY     partial  . AMPUTATION Left 08/30/2019   Procedure: AMPUTATION BELOW KNEE LEFT;  Surgeon: [name], MD;  Location: [facility];  Service: Vascular;  Laterality: Left;  . AMPUTATION Left 10/21/2019   Procedure: Amputation Above Knee;  Surgeon: Rosetta Posner, MD;  Location: [place];  Service: Vascular;  Laterality: Left;  . AMPUTATION Right 11/17/2019   Procedure: AMPUTATION ABOVE KNEE, right leg;  Surgeon: Rosetta Posner, MD;  Location: [place];  Service: Vascular;  Laterality: Right;  . AV FISTULA PLACEMENT Right 06/06/2013   Procedure: ARTERIOVENOUS (AV) FISTULA CREATION-RIGHT BRACHIAL CEPHALIC;  Surgeon: Conrad , MD;  Location: [place];  Service: Vascular;  Laterality: Right;  . BREAST BIOPSY Left   . BREAST LUMPECTOMY Left    "and took out some lymph nodes" (06/15/2013)  . CARDIAC CATHETERIZATION     04/02/15 [facility]): no angiographic CAD, LVEF 40% with global hypokinesis (LHC done for + stress echo, EF 45% 11/28/14)  . CARPAL TUNNEL RELEASE Left 11/26/2017   Procedure: LEFT CARPAL TUNNEL RELEASE;  Surgeon: [name], MD;  Location: [place];  Service: Orthopedics;  Laterality: Left;  . CATARACT EXTRACTION W/ ANTERIOR VITRECTOMY Bilateral   . CESAREAN SECTION  1980  . COLONOSCOPY    . ESOPHAGOGASTRODUODENOSCOPY N/A 07/08/2017   Procedure: ESOPHAGOGASTRODUODENOSCOPY (EGD);  Surgeon: [name], MD;  Location: [facility];  Service: Endoscopy;  Laterality: N/A;  . ESOPHAGOGASTRODUODENOSCOPY (EGD) WITH PROPOFOL N/A 02/07/2018   Procedure: ESOPHAGOGASTRODUODENOSCOPY (EGD) WITH PROPOFOL;  Surgeon: [name], MD;  Location: [facility] ENDOSCOPY;  Service: Gastroenterology;  Laterality: N/A;  APC and clips placed  . EYE SURGERY Bilateral    laser surgery  . LOWER EXTREMITY ANGIOGRAPHY Bilateral 11/30/2018   Procedure: LOWER EXTREMITY  ANGIOGRAPHY;  Surgeon: [name], MD;  Location: [place] [facility];  Service: Cardiovascular;  Laterality: Bilateral;  . PARS PLANA VITRECTOMY Right 05/05/2017   Procedure: PARS PLANA VITRECTOMY WITH 25 GAUGE; PARTIAL REMOVAL OF OIL; INFERIOR PERIPHERAL IRIDECTOMY, REFORM ANTERIOR CHAMBER RIGHT EYE;  Surgeon: [name], MD;  Location: [place];  Service: Ophthalmology;  Laterality: Right;  . PERIPHERAL VASCULAR BALLOON ANGIOPLASTY Right 09/21/2018   Procedure: PERIPHERAL VASCULAR BALLOON ANGIOPLASTY;  Surgeon: [name], MD;  Location: [place] [facility];  Service: Cardiovascular;  Laterality: Right;  AV fistula  . PERIPHERAL VASCULAR BALLOON ANGIOPLASTY Left 11/30/2018   Procedure: PERIPHERAL VASCULAR BALLOON ANGIOPLASTY;  Surgeon: [name], MD;  Location: [place] [facility];  Service: Cardiovascular;  Laterality: Left;  Left Anterior Tibial Artery  . REFRACTIVE SURGERY Bilateral   . REMOVAL OF A DIALYSIS CATHETER Right 06/06/2013   Procedure: REMOVAL OF RIGHT MEDIPORT;  Surgeon: Jannette Fogo  Bridgett Larsson, MD;  Location: [place];  Service: Vascular;  Laterality: Right;  . TONSILLECTOMY    . UPPER GASTROINTESTINAL ENDOSCOPY    . WOUND DEBRIDEMENT Left 09/30/2019   Procedure: IRRIGATION AND DEBRIDEMENT WOUND OF LEFT BELOW KNEE AMPUTATION;  Surgeon: [name], MD;  Location: [facility] OR;  Service: Vascular;  Laterality: Left;    Family History  Problem Relation Age of Onset  . Diabetes Mother   . Hyperlipidemia Mother   . Hypertension Mother   . Hypertension Father   . Diabetes Sister   . Diabetes Brother   . Hypertension Brother   . Heart attack Brother   . Kidney disease Brother   . Colon cancer Neg Hx   . Colon polyps Neg Hx   . Esophageal cancer Neg Hx   . Gallbladder disease Neg Hx   . Rectal cancer Neg Hx   . Stomach cancer Neg Hx     Social History   Tobacco Use  . Smoking status: Never Smoker  . Smokeless tobacco: Never Used  Substance Use Topics  . Alcohol  use: No  . Drug use: No    Prior to Admission medications   Medication Sig Start Date End Date Taking? Authorizing Provider  acetaminophen (TYLENOL) 500 MG tablet Take 1,000 mg by mouth 2 (two) times daily with a meal.    Yes [provider]  albuterol (PROVENTIL) (2.5 MG/3ML) 0.083% nebulizer solution Take 3 mLs (2.5 mg total) by nebulization every 6 (six) hours as needed for wheezing or shortness of breath. 10/30/16  Yes Theodis Blaze, MD  albuterol (VENTOLIN HFA) 108 (90 Base) MCG/ACT inhaler Inhale 2 puffs into the lungs every 6 (six) hours as needed for wheezing or shortness of breath.    Yes [provider]  Alcohol Swabs (B-D SINGLE USE SWABS REGULAR) PADS in the morning, at noon, and at bedtime.  03/08/19  Yes [provider]  aspirin EC 81 MG tablet Take 81 mg by mouth daily.   Yes [provider]  atropine 1 % ophthalmic solution Place 1 drop into the right eye 2 (two) times daily. 07/04/17  Yes [provider]  BD PEN NEEDLE NANO 2ND GEN 32G X 4 MM MISC in the morning, at noon, and at bedtime.  03/03/19  Yes [provider]  carvedilol (COREG) 25 MG tablet Take 0.5 tablets (12.5 mg total) by mouth 2 (two) times daily. 10/24/19 12/16/19 Yes Alma Friendly, MD  clopidogrel (PLAVIX) 75 MG tablet TAKE 1 TABLET BY MOUTH EVERY DAY Patient taking differently: Take 75 mg by mouth daily.  11/21/19  Yes Waynetta Sandy, MD  darbepoetin [facility]) 200 MCG/0.4ML SOLN injection Inject 0.4 mLs (200 mcg total) into the vein every Wednesday with hemodialysis. 06/22/13  Yes Eulogio Bear U, DO  diclofenac sodium (VOLTAREN) 1 % GEL Apply 2 g topically 4 (four) times daily as needed (pain).  04/28/19  Yes [provider]  diphenhydrAMINE (BENADRYL) 25 MG tablet Take 25 mg by mouth in the morning, at noon, and at bedtime.    Yes [provider]  docusate sodium (COLACE) 100 MG capsule Take 100 mg by mouth 2 (two) times daily with  a meal.    Yes [provider]  doxercalciferol (HECTOROL) 4 MCG/2ML injection Inject 0.5 mLs (1 mcg total) into the vein every Monday, Wednesday, and Friday with hemodialysis. 06/18/13  Yes Vann, Jessica U, DO  DULoxetine (CYMBALTA) 30 MG capsule Take 30 mg by mouth daily.  Yes [provider]  fluticasone (FLONASE) 50 MCG/ACT nasal spray Place 2 sprays into both nostrils daily as needed for allergies.  02/07/19  Yes [provider]  gabapentin (NEURONTIN) 100 MG capsule Take 100 mg by mouth 2 (two) times daily.   Yes [provider]  insulin aspart (NOVOLOG FLEXPEN) 100 UNIT/ML FlexPen Inject 2-10 Units into the skin 2 (two) times daily with a meal.    Yes [provider]  lanthanum (FOSRENOL) 1000 MG chewable tablet Chew 2,000-4,000 mg by mouth See admin instructions. Chew 4,000 mg by mouth with each meal and 2,000 mg with each snack   Yes [provider]  lisinopril (ZESTRIL) 40 MG tablet Take 0.5 tablets (20 mg total) by mouth at bedtime. 10/24/19 12/16/19 Yes Alma Friendly, MD  omeprazole (PRILOSEC) 40 MG capsule Take 1 capsule (40 mg total) by mouth daily. Patient taking differently: Take 40 mg by mouth 2 (two) times daily.  02/09/18  Yes Cherene Altes, MD  [facility] VERIO test strip 1 each by Other route 3 (three) times daily.  12/15/17  Yes [provider]  prednisoLONE acetate (PRED FORTE) 1 % ophthalmic suspension Place 1 drop into the right eye 2 (two) times daily.  04/27/17  Yes [provider]  traMADol (ULTRAM) 50 MG tablet Take 1 tablet (50 mg total) by mouth every 6 (six) hours as needed. Patient taking differently: Take 25 mg by mouth every 6 (six) hours as needed for moderate pain.  12/12/19  Yes Dagoberto Ligas, PA-C  clopidogrel (PLAVIX) 75 MG tablet TAKE 1 TABLET BY MOUTH EVERY DAY Patient not taking: No sig reported 11/21/19   Waynetta Sandy, MD  levofloxacin (LEVAQUIN) 500 MG tablet Take 1  tablet (500 mg total) by mouth every other day for 10 days. Patient not taking: Reported on 12/16/2019 12/07/19 12/17/19  Alfredia Client, PA-C    Current Facility-Administered Medications  Medication Dose Route Frequency Provider Last Rate Last Admin  . albuterol (PROVENTIL) (2.5 MG/3ML) 0.083% nebulizer solution 2.5 mg  2.5 mg Nebulization Q4H PRN Smith, Rondell A, MD      . insulin aspart (novoLOG) injection 0-6 Units  0-6 Units Subcutaneous TID WC Smith, Rondell A, MD      . ondansetron (ZOFRAN) tablet 4 mg  4 mg Oral Q6H PRN Fuller Plan A, MD       Or  . ondansetron (ZOFRAN) injection 4 mg  4 mg Intravenous Q6H PRN Smith, Rondell A, MD      . pantoprazole (PROTONIX) injection 40 mg  40 mg Intravenous Q12H Smith, Rondell A, MD   40 mg at 12/17/19 0831   Facility-Administered Medications Ordered in Other Encounters  Medication Dose Route Frequency Provider Last Rate Last Admin  . technetium albumin aggregated (MAA) injection solution 1.6 millicurie  1.6 millicurie Intravenous Once PRN Abigail Miyamoto, MD        Allergies as of 12/16/2019 - Review Complete 12/16/2019  Allergen Reaction Noted  . Tape Itching and Rash 11/24/2017  . Latex Hives 11/01/2014  . Oxycodone Other (See Comments) 08/28/2016  . Tramadol Other (See Comments) 08/28/2016  . Morphine and related Other (See Comments) 10/19/2019  . Vicodin [hydrocodone-acetaminophen] Other (See Comments) 05/05/2017     Review of Systems:    Constitutional: No weight loss, fever or chills Skin: No rash  Cardiovascular: No chest pain   Respiratory: No SOB  Gastrointestinal: See HPI and otherwise negative Genitourinary: No dysuria Neurological: No headache, dizziness or syncope Musculoskeletal: No  new muscle or joint pain Hematologic: No bruising Psychiatric: No history of depression or anxiety    Physical Exam:  Vital signs in last 24 hours: Temp:  [97.6 F (36.4 C)-98.9 F (37.2 C)] 98.2 F (36.8 C) (05/29 0914) Pulse Rate:   [68-77] 72 (05/29 0914) Resp:  [11-21] 14 (05/29 0914) BP: (71-118)/(17-69) 116/57 (05/29 0914) SpO2:  [93 %-100 %] 100 % (05/29 0914) Weight:  [81.6 kg] 81.6 kg (05/28 1644)   General:   Pleasant legally blind, African-American female appears to be in NAD, Well developed, Well nourished, alert and cooperative Head:  Normocephalic and atraumatic. Eyes: Legally blind Ears:  Normal auditory acuity. Neck:  Supple Throat: Oral cavity and pharynx without inflammation, swelling or lesion.  Lungs: Respirations even and unlabored. Lungs clear to auscultation bilaterally.   No wheezes, crackles, or rhonchi.  On 3 L nasal cannula Heart: Normal S1, S2. No MRG. Regular rate and rhythm. No peripheral edema, cyanosis or pallor.  Abdomen:  Soft, nondistended, mild epigastric TTP. No rebound or guarding. Normal bowel sounds. No appreciable masses or hepatomegaly. Rectal:  Not performed.  Msk:  Symmetrical without gross deformities. Peripheral pulses intact.  Extremities: Bilateral BKA Neurologic:  Alert and  oriented x4;  grossly normal neurologically.  Skin:   Dry and intact without significant lesions or rashes. Psychiatric: Demonstrates good judgement and reason without abnormal affect or behaviors.   LAB RESULTS: Recent Labs    12/16/19 1652 12/17/19 0446  WBC 8.9 8.0  HGB 5.8* 6.7*  HCT 19.1* 21.3*  PLT 297 277   BMET Recent Labs    12/16/19 1652 12/17/19 0446  NA 138 138  K 3.4* 3.9  CL 95* 97*  CO2 28 29  GLUCOSE 136* 116*  BUN 15 18  CREATININE 3.60* 4.15*  CALCIUM 8.3* 8.3*   LFT Recent Labs    12/16/19 1652  PROT 5.7*  ALBUMIN 2.4*  AST 45*  ALT 26  ALKPHOS 188*  BILITOT 1.0    STUDIES: DG Chest Portable 1 View  Result Date: 12/16/2019 CLINICAL DATA:  Shortness of breath. EXAM: PORTABLE CHEST 1 VIEW COMPARISON:  Dec 13, 2019 FINDINGS: There is stable cardiomegaly with vascular congestion and developing pulmonary edema. Advanced vascular calcifications are noted  of the thoracic aorta. There is no pneumothorax or significant pleural effusion. There is no focal infiltrate. There is no acute osseous abnormality. IMPRESSION: Cardiomegaly with developing pulmonary edema. Electronically Signed   By: Constance Holster M.D.   On: 12/16/2019 18:16     Impression / Plan:   Impression: 1.  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  Hypertension 3.  ESRD on HD Monday Wednesday Friday: Nephrology is planning to take her again for dialysis today given pulmonary edema 4.  Hypokalemia: Better today after correction 5.  PAD, status post bilateral AKI on Plavix and aspirin, last dose 12/16/2019 6.  GERD: And history of bleeding duodenal angiectasia in 2019  Plan: 1.  Agree with Pantoprazole 40 twice daily 2.  Patient will nee an EGD/enteroscopy, likely this will be scheduled tomorrow. 3.  Pending timing of above will allow patient clear liquids today and n.p.o. after midnight 3.  Did discuss risks, benefits, limitations and alternatives and patient agrees to proceed.  These to be scheduled Dr. Rush Landmark. 4.  Continue to monitor hemoglobin with transfusion as needed less than 7 5.  Agree with correction of hypokalemia 6.  Hold Plavix 7.  Please await any further recommendations from Dr. Rush Landmark later today  Thank you for your kind consultation, we will continue to follow.  Lavone Nian [facility]  12/17/2019, 9:38 AM

## 2019-12-18 ENCOUNTER — Inpatient Hospital Stay (HOSPITAL_COMMUNITY): Payer: Medicare Other | Admitting: Anesthesiology

## 2019-12-18 ENCOUNTER — Encounter (HOSPITAL_COMMUNITY): Payer: Self-pay | Admitting: Internal Medicine

## 2019-12-18 ENCOUNTER — Encounter (HOSPITAL_COMMUNITY)
Admission: EM | Disposition: A | Discharge status: Home / Self Care | Payer: Self-pay | Source: Home / Self Care | Attending: Internal Medicine

## 2019-12-18 HISTORY — PX: ENTEROSCOPY: SHX5533

## 2019-12-18 HISTORY — PX: HOT HEMOSTASIS: SHX5433

## 2019-12-18 HISTORY — PX: SUBMUCOSAL TATTOO INJECTION: SHX6856

## 2019-12-18 LAB — CBC
HCT: 26.4 % — ABNORMAL LOW (ref 36.0–46.0)
Hemoglobin: 8.2 g/dL — ABNORMAL LOW (ref 12.0–15.0)
MCH: 32.3 pg (ref 26.0–34.0)
MCHC: 31.1 g/dL (ref 30.0–36.0)
MCV: 103.9 fL — ABNORMAL HIGH (ref 80.0–100.0)
Platelets: 265 10*3/uL (ref 150–400)
RBC: 2.54 MIL/uL — ABNORMAL LOW (ref 3.87–5.11)
RDW: 22.1 % — ABNORMAL HIGH (ref 11.5–15.5)
WBC: 8.2 10*3/uL (ref 4.0–10.5)
nRBC: 0 % (ref 0.0–0.2)

## 2019-12-18 LAB — TYPE AND SCREEN
ABO/RH(D): A POS
Antibody Screen: NEGATIVE
Unit division: 0
Unit division: 0

## 2019-12-18 LAB — GLUCOSE, CAPILLARY
Glucose-Capillary: 102 mg/dL — ABNORMAL HIGH (ref 70–99)
Glucose-Capillary: 118 mg/dL — ABNORMAL HIGH (ref 70–99)
Glucose-Capillary: 128 mg/dL — ABNORMAL HIGH (ref 70–99)
Glucose-Capillary: 141 mg/dL — ABNORMAL HIGH (ref 70–99)
Glucose-Capillary: 153 mg/dL — ABNORMAL HIGH (ref 70–99)

## 2019-12-18 LAB — BPAM RBC
Blood Product Expiration Date: 202106232359
Blood Product Expiration Date: 202106232359
ISSUE DATE / TIME: 202105281930
ISSUE DATE / TIME: 202105290613
Unit Type and Rh: 6200
Unit Type and Rh: 6200

## 2019-12-18 SURGERY — ENTEROSCOPY
Anesthesia: Monitor Anesthesia Care

## 2019-12-18 MED ORDER — PROPOFOL 500 MG/50ML IV EMUL
INTRAVENOUS | Status: DC | PRN
  Administered 2019-12-18: 100 ug/kg/min via INTRAVENOUS

## 2019-12-18 MED ORDER — GLUCAGON HCL RDNA (DIAGNOSTIC) 1 MG IJ SOLR
INTRAMUSCULAR | Status: AC
  Filled 2019-12-18: qty 1

## 2019-12-18 MED ORDER — SPOT INK MARKER SYRINGE KIT
PACK | SUBMUCOSAL | Status: DC | PRN
  Administered 2019-12-18: 1.5 mL via SUBMUCOSAL

## 2019-12-18 MED ORDER — SODIUM CHLORIDE 0.9 % IV SOLN: INTRAVENOUS | Status: DC | PRN

## 2019-12-18 MED ORDER — LIDOCAINE HCL (CARDIAC) PF 100 MG/5ML IV SOSY
PREFILLED_SYRINGE | INTRAVENOUS | Status: DC | PRN
  Administered 2019-12-18: 80 mg via INTRATRACHEAL

## 2019-12-18 MED ORDER — SPOT INK MARKER SYRINGE KIT
PACK | SUBMUCOSAL | Status: AC
  Filled 2019-12-18: qty 5

## 2019-12-18 MED ORDER — SODIUM CHLORIDE 0.9 % IV SOLN: INTRAVENOUS | Status: DC

## 2019-12-18 MED ORDER — SODIUM CHLORIDE 0.9 % IV SOLN
INTRAVENOUS | Status: DC
  Administered 2019-12-18: 500 mL via INTRAVENOUS

## 2019-12-18 MED ORDER — PHENYLEPHRINE HCL (PRESSORS) 10 MG/ML IV SOLN
INTRAVENOUS | Status: DC | PRN
  Administered 2019-12-18 (×2): 80 ug via INTRAVENOUS

## 2019-12-18 MED ORDER — GLUCAGON HCL RDNA (DIAGNOSTIC) 1 MG IJ SOLR
INTRAMUSCULAR | Status: DC | PRN
  Administered 2019-12-18 (×2): .25 mg via INTRAVENOUS

## 2019-12-18 SURGICAL SUPPLY — 15 items

## 2019-12-18 NOTE — Progress Notes (Signed)
PROGRESS NOTE    [name]  IRW:[id number] DOB: 27-Apr-1951 DOA: 12/16/2019 PCP: Prince Solian, MD    Chief Complaint  Patient presents with  . GI Bleeding    Brief Narrative:  [facility] from home, per dialysis labs pt's hgb 5. Pt hypotensive with EMS, family reports pt has been having dark stools, but unsure for how long.  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 -plan to have endoscope evaluation this pm  ESRD on HD: Patient normally dialyzes Monday, Wednesday, and Friday.  She received a full hemodialysis treatment on Friday Nephrology notified  Hypokalemia, potassium 3.4, received 20 mEq supplement  Hypertension, presented with hypotension in the setting of GI bleed Hold home  blood pressure medication Coreg and lisinopril  Insulin-dependent type 2 diabetes, well controlled On sliding scale here  PVD status post bilateral AKA Hold aspirin Plavix in the setting of GI bleed  Legally blind  DVT prophylaxis: History of bilateral AKA, GI bleed Code Status: Full Family Communication: sister at bedside Disposition:   Status is: Inpatient   Dispo: The patient is from: Home              Anticipated d/c is to: 48hrs              Anticipated d/c date is: possible on Monday if clears by gi              Patient currently n.p.o.,   Consultants:    Nephrology  GI  Procedures:   Endoscope planned this pm  Antimicrobials:   None     Objective: Vitals:   12/17/19 2035 12/17/19 2152 12/17/19 2353 12/18/19 0353  BP: (!) 128/41 93/79 (!) 119/45 (!) 106/30  Pulse: 73 74 76 74  Resp: 14 18 17 18   Temp: 98.2 F (36.8 C) 98.5 F (36.9 C) 98.1 F (36.7 C) 98.3 F (36.8 C)  TempSrc: Oral Oral Oral Oral  SpO2:  100% 91% 95%  Weight:        Intake/Output Summary (Last 24 hours) at 12/18/2019 0714 Last data filed at 12/17/2019 2200 Gross per 24 hour  Intake 755 ml  Output 1745 ml  Net -990 ml   Filed Weights   12/16/19 1644 12/17/19 1730  Weight: 81.6 kg 75.2 kg    Examination:  General exam: calm, NAD, legally blind Respiratory system: Clear to auscultation. Respiratory effort normal. Cardiovascular system: S1 & S2 heard, RRR. No JVD, loud precordial murmur Gastrointestinal system: Abdomen is nondistended, soft and nontender. No organomegaly or masses felt. Normal bowel sounds heard. Central nervous system: Alert and oriented. No focal neurological deficits. Extremities: Status post bilateral AKA Skin: No rashes, lesions or ulcers Psychiatry: Judgement and insight appear normal. Mood & affect appropriate.     Data Reviewed: I have personally reviewed following labs and imaging studies  CBC: Recent Labs  Lab 12/16/19 1652 12/17/19 0446 12/17/19 1041  12/17/19 1540 12/18/19 0259  WBC 8.9 8.0  --   --  8.2  HGB 5.8* 6.7* 7.9* 8.3* 8.2*  HCT 19.1* 21.3* 24.9* 26.4* 26.4*  MCV 106.7* 103.4*  --   --  103.9*  PLT 297 277  --   --  161    Basic Metabolic Panel: Recent Labs  Lab 12/16/19 1652 12/17/19 0446  NA 138 138  K 3.4* 3.9  CL 95* 97*  CO2 28 29  GLUCOSE 136* 116*  BUN 15 18  CREATININE 3.60* 4.15*  CALCIUM 8.3* 8.3*    GFR: Estimated Creatinine Clearance: 13.4 mL/min (A) (by C-G formula based on SCr of 4.15 mg/dL (H)).  Liver Function Tests: Recent Labs  Lab 12/16/19 1652  AST 45*   ALT 26  ALKPHOS 188*  BILITOT 1.0  PROT 5.7*  ALBUMIN 2.4*    CBG: Recent Labs  Lab 12/17/19 0608 12/17/19 1140 12/17/19 1656 12/17/19 2200 12/18/19 0638  GLUCAP 96 167* 107* 87 102*     Recent Results (from the past 240 hour(s))  SARS Coronavirus 2 by RT PCR (hospital order, performed in [facility] hospital lab) Nasopharyngeal Nasopharyngeal Swab     Status: None   Collection Time: 12/16/19  7:10 PM   Specimen: Nasopharyngeal Swab  Result Value Ref Range Status   SARS Coronavirus 2 NEGATIVE NEGATIVE Final    Comment: (NOTE) SARS-CoV-2 target nucleic acids are NOT DETECTED. The SARS-CoV-2 RNA is generally detectable in upper and lower respiratory specimens during the acute phase of infection. The lowest concentration of SARS-CoV-2 viral copies this assay can detect is 250 copies / mL. A negative result does not preclude SARS-CoV-2 infection and should not be used as the sole basis for treatment or other patient management decisions.  A negative result may occur with improper specimen collection / handling, submission of specimen other than nasopharyngeal swab, presence of viral mutation(s) within the areas targeted by this assay, and inadequate number of viral copies (<250 copies / mL). A negative result must be combined with clinical observations, patient history, and epidemiological information. Fact Sheet for Patients:   StrictlyIdeas.no Fact Sheet for Healthcare Providers: BankingDealers.co.za This test is not yet approved or cleared  by the [place] FDA and has been authorized for detection and/or diagnosis of SARS-CoV-2 by FDA under an Emergency Use Authorization (EUA).  This EUA will remain in effect (meaning this test can be used) for the duration of the COVID-19 declaration under Section 564(b)(1) of the Act, 21 U.S.C. section 360bbb-3(b)(1), unless the authorization is terminated or revoked  sooner. Performed at [facility], [place] [address] [place], [place] [address]          Radiology Studies: DG Chest Portable 1 View  Result Date: 12/16/2019 CLINICAL DATA:  Shortness of breath. EXAM: PORTABLE CHEST 1 VIEW COMPARISON:  Dec 13, 2019 FINDINGS: There is stable cardiomegaly with vascular congestion and developing pulmonary edema. Advanced vascular calcifications are noted of the thoracic aorta. There is no pneumothorax or significant pleural effusion. There is no focal infiltrate. There is no acute osseous abnormality. IMPRESSION: Cardiomegaly with developing pulmonary edema. Electronically Signed   By: Constance Holster M.D.   On: 12/16/2019 18:16        Scheduled Meds: . insulin aspart  0-6 Units Subcutaneous TID WC  . pantoprazole (PROTONIX) IV  40 mg Intravenous Q12H   Continuous Infusions:   LOS: 2 days     Time spent: 22mins I have personally reviewed and interpreted  on  12/18/2019 daily labs, tele strips, imagings as discussed above under date review session and assessment and plans.  I reviewed all nursing notes, pharmacy notes, consultant notes,  vitals, pertinent old records  I have discussed plan of care as described above with RN , patient  on 12/18/2019  Voice Recognition /Dragon dictation system was used to create this note, attempts have been made to correct errors. Please contact the author with questions and/or clarifications.   Florencia Reasons, MD PhD FACP [facility]  Available via Epic secure chat 7am-7pm for nonurgent issues Please page for urgent issues To page the attending provider between 7A-7P or the covering provider during after hours 7P-7A, please log into the web site [email or web address] and access using universal [place] password for that web site. If you do not have the password, please call the hospital operator.    12/18/2019, 7:14 AM

## 2019-12-18 NOTE — Anesthesia Preprocedure Evaluation (Addendum)
Anesthesia Evaluation  Patient identified by MRN, date of birth, ID band Patient awake    Reviewed: Allergy & Precautions, H&P , NPO status , Patient's Chart, lab work & pertinent test results, reviewed documented beta blocker date and time   History of Anesthesia Complications (+) Family history of anesthesia reactionNegative for: history of anesthetic complications  Airway Mallampati: II  TM Distance: >3 FB Neck ROM: Full    Dental  (+) Dental Advidsory Given, Teeth Intact   Pulmonary asthma ,    Pulmonary exam normal        Cardiovascular Exercise Tolerance: Good hypertension, Pt. on medications and Pt. on home beta blockers + Peripheral Vascular Disease and +CHF  Normal cardiovascular exam+ Valvular Problems/Murmurs MR and AS   Study Conclusions   - Left ventricle: The cavity size was normal. There was moderate  concentric hypertrophy. Systolic function was mildly reduced. The  estimated ejection fraction was in the range of 45% to 50%.  Diffuse hypokinesis. Features are consistent with a pseudonormal  left ventricular filling pattern, with concomitant abnormal  relaxation and increased filling pressure (grade 2 diastolic  dysfunction).  - Aortic valve: Valve mobility was restricted. There was mild  stenosis. Peak velocity (S): 238 cm/s. Mean gradient (S): 13 mm  Hg.  - Mitral valve: Severely calcified annulus. There was moderate  regurgitation directed posteriorly.  - Left atrium: The atrium was moderately dilated. Volume/bsa, ES,  (1-plane Simpson&'s, A2C): 41.5 ml/m^2.  - Right ventricle: The cavity size was moderately dilated. Wall  thickness was normal.  - Right atrium: The atrium was moderately dilated.  - Tricuspid valve: There was moderate regurgitation.  - Pulmonary arteries: Systolic pressure was moderately increased.  PA peak pressure: 64 mm Hg (S).    Neuro/Psych  Headaches,  Anxiety negative psych ROS   GI/Hepatic Neg liver ROS, PUD, GERD  Medicated,  Endo/Other  diabetes, Insulin Dependent  Renal/GU ESRF and DialysisRenal disease  negative genitourinary   Musculoskeletal  (+) Arthritis , Osteoarthritis,  Fibromyalgia -  Abdominal   Peds  Hematology  (+) Blood dyscrasia, anemia ,   Anesthesia Other Findings   Reproductive/Obstetrics negative OB ROS                            Anesthesia Physical  Anesthesia Plan  ASA: III  Anesthesia Plan: MAC   Post-op Pain Management:    Induction: Intravenous  PONV Risk Score and Plan: 2 and Ondansetron and Propofol infusion  Airway Management Planned: Natural Airway  Additional Equipment:   Intra-op Plan:   Post-operative Plan:   Informed Consent: I have reviewed the patients History and Physical, chart, labs and discussed the procedure including the risks, benefits and alternatives for the proposed anesthesia with the patient or authorized representative who has indicated his/her understanding and acceptance.     Dental advisory given  Plan Discussed with: Anesthesiologist and CRNA  Anesthesia Plan Comments: (   )       Anesthesia Quick Evaluation

## 2019-12-18 NOTE — Transfer of Care (Signed)
Immediate Anesthesia Transfer of Care Note  Patient: [name]  Procedure(s) Performed: ESOPHAGOGASTRODUODENOSCOPY (EGD) WITH PROPOFOL (N/A ) ENTEROSCOPY (N/A )  Patient Location: PACU  Anesthesia Type:MAC  Level of Consciousness: drowsy  Airway & Oxygen Therapy: [name]  Post-op Assessment: Report given to RN and Post -op Vital signs reviewed and stable  Post vital signs: Reviewed and stable  Last Vitals:  Vitals Value Taken Time  BP    Temp    Pulse    Resp    SpO2      Last Pain:  Vitals:   12/18/19 1548  TempSrc: Oral  PainSc: 0-No pain         Complications: No apparent anesthesia complications

## 2019-12-18 NOTE — Progress Notes (Signed)
Plan will be for VCE swallow tomorrow. Discussed with patient and with her sister. Will be CLD for now and continue into tomorrow AM. Not sure when we will be able to do the actual swallow but we will make her NPO. This is also in the setting of not knowing when her dialysis session will be. NPO at midnight. If she goes to early HD, then we will allow her to have clear liquids up to 2 hours prior to the VCE.  Justice Britain, MD [facility] Office # [phone]

## 2019-12-18 NOTE — Progress Notes (Signed)
Patient back to room 4E18 from endo. Vital signs obtained. Alert and oriented to room and call light. Call bell within reach. Will continue to monitor.  Paulene Floor, RN

## 2019-12-18 NOTE — Progress Notes (Signed)
Subjective:  HD yest on schedule ,tolerated  No cos ,awaitng egd   Objective Vital signs in last 24 hours: Vitals:   12/17/19 2353 12/18/19 0353 12/18/19 0759 12/18/19 1307  BP: (!) 119/45 (!) 106/30 (!) 130/50 (!) 122/50  Pulse: 76 74 82 82  Resp: 17 18 20 20   Temp: 98.1 F (36.7 C) 98.3 F (36.8 C) 98.4 F (36.9 C) 98.4 F (36.9 C)  TempSrc: Oral Oral Oral Oral  SpO2: 91% 95% 100% 100%  Weight:       Weight change: -6.448 kg  Physical Exam: General: alert , NAD,  Heart: RRR, 2/6 sem , no rub  Lungs: CTA, unlabored breathing  Rm air  Abdomen: Obese, Bs pos, soft , Nd,Minamal tender epigastrium , no ascites, no masses appreciated  Extremities: Bilat AKA , R stumps healed R with staples   Dialysis Access: pos bruit ,  Dialysis Orders: Center: [facility]  on mwf . EDW 73.5  HD Bath 2k, ca 2.25 Time 4hrs Heparin none. Access RUA AVF    Hectorol 42mcg mcg IV/HD Mircera 225 mcg iv q 2wks ( last on 12/12/19)        Problem/Plan: 1. Symptomatic lower Gi Bleed = HGB 5.8 , gi seeing  , no hep hd  Sp 1 unit  hgb 6.7 > 8.2 this am  2. ESRD -  HD MWF,admit  CXR concerning for Pulm edema  HD yest 3 l uf  And no sob now  rm ar  3. Hypertension/volume  - as noted on admit low bp , home bp meds coreg, lisinopril hold , CXR  pulm edema  Hd as above  4. Anemia  Of ESRD and ABLA= esa just given  5. Metabolic bone disease -  binder when po diet , Hec on MWF hd  corec ca 9.3  6. PAD = sp recent Bilat AKA this year. Holding Plavix  Per admit  7. DM Type 2 = per admit  Ernest Haber, PA-C [facility] [phone] 12/18/2019,1:15 PM  LOS: 2 days   Labs: Basic Metabolic Panel: Recent Labs  Lab 12/16/19 1652 12/17/19 0446  NA 138 138  K 3.4* 3.9  CL 95* 97*  CO2 28 29  GLUCOSE 136* 116*  BUN 15 18  CREATININE 3.60* 4.15*  CALCIUM 8.3* 8.3*   Liver Function Tests: Recent Labs  Lab 12/16/19 1652  AST 45*  ALT 26  ALKPHOS 188*  BILITOT 1.0  PROT 5.7*  ALBUMIN  2.4*   Recent Labs  Lab 12/16/19 1701  LIPASE 32   No results for input(s): AMMONIA in the last 168 hours. CBC: Recent Labs  Lab 12/16/19 1652 12/16/19 1652 12/17/19 0446 12/17/19 0446 12/17/19 1041 12/17/19 1540 12/18/19 0259  WBC 8.9  --  8.0  --   --   --  8.2  HGB 5.8*   < > 6.7*   < > 7.9* 8.3* 8.2*  HCT 19.1*   < > 21.3*   < > 24.9* 26.4* 26.4*  MCV 106.7*  --  103.4*  --   --   --  103.9*  PLT 297  --  277  --   --   --  265   < > = values in this interval not displayed.   Cardiac Enzymes: No results for input(s): CKTOTAL, CKMB, CKMBINDEX, TROPONINI in the last 168 hours. CBG: Recent Labs  Lab 12/17/19 1140 12/17/19 1656 12/17/19 2200 12/18/19 0638 12/18/19 1126  GLUCAP 167* 107* 87 102*  118*    Studies/Results: DG Chest Portable 1 View  Result Date: 12/16/2019 CLINICAL DATA:  Shortness of breath. EXAM: PORTABLE CHEST 1 VIEW COMPARISON:  Dec 13, 2019 FINDINGS: There is stable cardiomegaly with vascular congestion and developing pulmonary edema. Advanced vascular calcifications are noted of the thoracic aorta. There is no pneumothorax or significant pleural effusion. There is no focal infiltrate. There is no acute osseous abnormality. IMPRESSION: Cardiomegaly with developing pulmonary edema. Electronically Signed   By: Constance Holster M.D.   On: 12/16/2019 18:16   Medications:  . insulin aspart  0-6 Units Subcutaneous TID WC  . pantoprazole (PROTONIX) IV  40 mg Intravenous Q12H

## 2019-12-18 NOTE — Op Note (Addendum)
[facility] Patient Name: [name] Procedure Date : 12/18/2019 MRN: [id number] Attending MD: Justice Britain , MD Date of Birth: Sep 26, 1950 CSN: [id number] Age: 69 Admit Type: Inpatient Procedure:                Small bowel enteroscopy Indications:              Anemia, Melena, Occult blood in stool Providers:                Justice Britain, MD, Cleda Daub, RN,                            William Dalton, Technician Referring MD:             [facility], [name], MD Medicines:                Monitored Anesthesia Care Complications:            No immediate complications. Estimated Blood Loss:     Estimated blood loss: none. Procedure:                Pre-Anesthesia Assessment:                           - Prior to the procedure, a History and Physical                            was performed, and patient medications and                            allergies were reviewed. The patient's tolerance of                            previous anesthesia was also reviewed. The risks                            and benefits of the procedure and the sedation                            options and risks were discussed with the patient.                            All questions were answered, and informed consent                            was obtained. Prior Anticoagulants: The patient has                            taken no previous anticoagulant or antiplatelet                            agents except for aspirin. ASA Grade Assessment:                            III - A patient with severe systemic disease. After  reviewing the risks and benefits, the patient was                            deemed in satisfactory condition to undergo the                            procedure.                           After obtaining informed consent, the endoscope was                            passed under direct vision. Throughout the                             procedure, the patient's blood pressure, pulse, and                            oxygen saturations were monitored continuously. The                            [id number]-[id number] ([id number]) Olympus pediatric colonscope                            was introduced through the mouth and advanced to                            the proximal jejunum. The small bowel enteroscopy                            was accomplished without difficulty. The patient                            tolerated the procedure. Scope In: Scope Out: Findings:      No gross lesions were noted in the entire esophagus.      The Z-line was regular and was found 40 cm from the incisors.      A few dispersed diminutive erosions with no bleeding and no stigmata of       recent bleeding were found in the stomach.      A single angioectasia with no bleeding was found in the third portion of       the duodenum. Fulguration to ablate the lesion to prevent bleeding by       argon plasma was successful.      Normal mucosa was found in the rest of the visualized duodenum.      A single angioectasia with no bleeding was found in the proximal       jejunum. Fulguration to ablate the lesion to prevent bleeding by argon       plasma was successful.      Normal mucosa was found in the rest of the visualized proximal jejunum.       Area was tattooed with an injection of Spot (carbon black) to demarcate       the distal extent of today's SBE. Impression:               -  No gross lesions in esophagus. Z-line regular, 40                            cm from the incisors.                           - Erosive gastropathy with no bleeding and no                            stigmata of recent bleeding.                           - A single non-bleeding angioectasia in the                            duodenum. Treated with argon plasma coagulation                            (APC).                           - Otherwise, normal mucosa was found in the  entire                            examined duodenum.                           - A single non-bleeding angioectasia in the                            jejunum. Treated with argon plasma coagulation                            (APC).                           - Otherwise, normal mucosa was found in the                            visualized portion of proximal jejunum. Tattooed                            distal extent. Recommendation:           - The patient will be observed post-procedure,                            until all discharge criteria are met.                           - Return patient to hospital ward for ongoing care.                           - Clear liquid diet for now.                           - Will discuss  with patient consideration of VCE                            based on the findings that we had today. Will hold                            on Colonoscopy now, but may require depending on                            findings of VCE if patient agrees. I will place                            updated note in system after we find out what                            patient wants to do and what may be available.                           - Trend Hgb/Hct.                           - The findings and recommendations were discussed                            with the patient.                           - The findings and recommendations were discussed                            with the referring physician. Procedure Code(s):        --- Professional ---                           [id number], Small intestinal endoscopy, enteroscopy                            beyond second portion of duodenum, not including                            ileum; with control of bleeding (eg, injection,                            bipolar cautery, unipolar cautery, laser, heater                            probe, stapler, plasma coagulator)                           44799, Unlisted procedure, small  intestine Diagnosis Code(s):        --- Professional ---                           K31.89, Other diseases of stomach and duodenum  K31.819, Angiodysplasia of stomach and duodenum                            without bleeding                           K55.20, Angiodysplasia of colon without hemorrhage                           D64.9, Anemia, unspecified                           K92.1, Melena (includes Hematochezia)                           R19.5, Other fecal abnormalities CPT copyright 2019 American Medical Association. All rights reserved. The codes documented in this report are preliminary and upon coder review may  be revised to meet current compliance requirements. Justice Britain, MD 12/18/2019 5:02:30 PM Number of Addenda: 0

## 2019-12-18 NOTE — Anesthesia Postprocedure Evaluation (Signed)
Anesthesia Post Note  Patient: ETHELYNE ERICH  Procedure(s) Performed: ESOPHAGOGASTRODUODENOSCOPY (EGD) WITH PROPOFOL (N/A ) ENTEROSCOPY (N/A )     Patient location during evaluation: PACU Anesthesia Type: MAC Level of consciousness: awake and alert Pain management: pain level controlled Vital Signs Assessment: post-procedure vital signs reviewed and stable Respiratory status: spontaneous breathing and respiratory function stable Cardiovascular status: stable Postop Assessment: no apparent nausea or vomiting Anesthetic complications: no    Last Vitals:  Vitals:   12/18/19 1705 12/18/19 1721  BP: (!) 99/46 (!) 113/49  Pulse: 74 73  Resp: 14 16  Temp: 36.8 C 36.7 C  SpO2: 98% 98%    Last Pain:  Vitals:   12/18/19 1721  TempSrc: Oral  PainSc:                  SINGER,JAMES DANIEL

## 2019-12-18 NOTE — Anesthesia Procedure Notes (Signed)
Procedure Name: MAC Date/Time: 12/18/2019 4:05 PM Performed by: Kathryne Hitch, CRNA Pre-anesthesia Checklist: Patient identified, Emergency Drugs available, Patient being monitored and Suction available Oxygen Delivery Method: Nasal cannula Preoxygenation: Pre-oxygenation with 100% oxygen Induction Type: IV induction Dental Injury: Teeth and Oropharynx as per pre-operative assessment

## 2019-12-19 ENCOUNTER — Encounter: Payer: Self-pay | Admitting: Gastroenterology

## 2019-12-19 ENCOUNTER — Encounter (HOSPITAL_COMMUNITY)
Admission: EM | Disposition: A | Discharge status: Home / Self Care | Payer: Self-pay | Source: Home / Self Care | Attending: Internal Medicine

## 2019-12-19 DIAGNOSIS — E1142 Type 2 diabetes mellitus with diabetic polyneuropathy: Secondary | ICD-10-CM

## 2019-12-19 DIAGNOSIS — Z794 Long term (current) use of insulin: Secondary | ICD-10-CM

## 2019-12-19 DIAGNOSIS — I739 Peripheral vascular disease, unspecified: Secondary | ICD-10-CM

## 2019-12-19 DIAGNOSIS — I959 Hypotension, unspecified: Secondary | ICD-10-CM

## 2019-12-19 HISTORY — PX: GIVENS CAPSULE STUDY: SHX5432

## 2019-12-19 LAB — GLUCOSE, CAPILLARY
Glucose-Capillary: 110 mg/dL — ABNORMAL HIGH (ref 70–99)
Glucose-Capillary: 96 mg/dL (ref 70–99)
Glucose-Capillary: 101 mg/dL — ABNORMAL HIGH (ref 70–99)
Glucose-Capillary: 107 mg/dL — ABNORMAL HIGH (ref 70–99)

## 2019-12-19 LAB — CBC
HCT: 26.9 % — ABNORMAL LOW (ref 36.0–46.0)
Hemoglobin: 8.5 g/dL — ABNORMAL LOW (ref 12.0–15.0)
MCH: 32.9 pg (ref 26.0–34.0)
MCHC: 31.6 g/dL (ref 30.0–36.0)
MCV: 104.3 fL — ABNORMAL HIGH (ref 80.0–100.0)
Platelets: 277 10*3/uL (ref 150–400)
RBC: 2.58 MIL/uL — ABNORMAL LOW (ref 3.87–5.11)
RDW: 22 % — ABNORMAL HIGH (ref 11.5–15.5)
WBC: 9.3 10*3/uL (ref 4.0–10.5)
nRBC: 0 % (ref 0.0–0.2)

## 2019-12-19 SURGERY — IMAGING PROCEDURE, GI TRACT, INTRALUMINAL, VIA CAPSULE

## 2019-12-19 NOTE — Progress Notes (Signed)
PROGRESS NOTE    [name]  NAT:[id number]  DOB: 21-May-1951  PCP: Prince Solian, MD Admit date:12/16/2019 69 y.o.F with h/o ESRD on HD, PADs/pbilateralAKA, HTN, DM type II,legally blind, esophageal ulcer, and GI bleed presented with c/o dizziness and after hemoglobin noted to be around 5 upon hemodialysis lab draw. Family members noticed dark stools. Patient does not use any NSAIDs, but is on Plavix and aspirin. Last EGD was in 2017 showed single bleeding duodenal angioectasia that was treated with APC and hemostatic clips ED Course: Afebrile, BP 71/59 with IV fluids improved to 106/42.Labs significant for hemoglobin 5.8, potassium 3.4, BUN 15,andcreatinine 3.6. Stool guaiacs were noted to be positive. Chest x-ray revealed cardiomegaly with developing pulmonary edema. Patient recieved 1 unit of packed red blood cells. Patient had been given 500 mL of IV fluids, and 2 mg of Protonix IV. Hospital course: Patient admitted to [facility] service with [facility] consultation  Subjective:  Patient awaiting HD /capsule study. States is hungry and hoping GI will clear for diet. Lives with sister who is her primary care giver   Objective: Vitals:   12/19/19 0037 12/19/19 0410 12/19/19 0414 12/19/19 0528  BP: (!) 127/47  (!) 139/44   Pulse: 81  77 77  Resp: 17  17 13   Temp: 98.6 F (37 C)  98.6 F (37 C)   TempSrc: Oral  Oral   SpO2: 92%  98% 93%  Weight:  74 kg      Intake/Output Summary (Last 24 hours) at 12/19/2019 0819 Last data filed at 12/18/2019 2100 Gross per 24 hour  Intake 620 ml  Output --  Net 620 ml   Filed Weights   12/16/19 1644 12/17/19 1730 12/19/19 0410  Weight: 81.6 kg 75.2 kg 74 kg    Physical Examination:  General exam: Appears calm and comfortable , legally blind Respiratory system: Clear to auscultation. Respiratory effort normal. Cardiovascular system: S1 & S2 heard, RRR. No JVD, murmurs, rubs, gallops or clicks. No pedal edema. Gastrointestinal  system: Abdomen is nondistended, soft and nontender. Normal bowel sounds heard. Central nervous system: Alert and oriented. No new focal neurological deficits. Extremities: s/o Bilateral BKA, has staple along rt stump Psychiatry: Judgement and insight appear normal. Mood & affect appropriate.   Data Reviewed: I have personally reviewed following labs and imaging studies  CBC: Recent Labs  Lab 12/16/19 1652 12/16/19 1652 12/17/19 0446 12/17/19 1041 12/17/19 1540 12/18/19 0259 12/19/19 0301  WBC 8.9  --  8.0  --   --  8.2 9.3  HGB 5.8*   < > 6.7* 7.9* 8.3* 8.2* 8.5*  HCT 19.1*   < > 21.3* 24.9* 26.4* 26.4* 26.9*  MCV 106.7*  --  103.4*  --   --  103.9* 104.3*  PLT 297  --  277  --   --  265 277   < > = values in this interval not displayed.   Basic Metabolic Panel: Recent Labs  Lab 12/16/19 1652 12/17/19 0446  NA 138 138  K 3.4* 3.9  CL 95* 97*  CO2 28 29  GLUCOSE 136* 116*  BUN 15 18  CREATININE 3.60* 4.15*  CALCIUM 8.3* 8.3*   GFR: Estimated Creatinine Clearance: 13.2 mL/min (A) (by C-G formula based on SCr of 4.15 mg/dL (H)). Liver Function Tests: Recent Labs  Lab 12/16/19 1652  AST 45*  ALT 26  ALKPHOS 188*  BILITOT 1.0  PROT 5.7*  ALBUMIN 2.4*   Recent Labs  Lab 12/16/19 1701  LIPASE  32   No results for input(s): AMMONIA in the last 168 hours. Coagulation Profile: No results for input(s): INR, PROTIME in the last 168 hours. Cardiac Enzymes: No results for input(s): CKTOTAL, CKMB, CKMBINDEX, TROPONINI in the last 168 hours. BNP (last 3 results) No results for input(s): PROBNP in the last 8760 hours. HbA1C: No results for input(s): HGBA1C in the last 72 hours. CBG: Recent Labs  Lab 12/18/19 1126 12/18/19 1654 12/18/19 1748 12/18/19 2155 12/19/19 0625  GLUCAP 118* 128* 141* 153* 110*   Lipid Profile: No results for input(s): CHOL, HDL, LDLCALC, TRIG, CHOLHDL, LDLDIRECT in the last 72 hours. Thyroid Function Tests: No results for input(s):  TSH, T4TOTAL, FREET4, T3FREE, THYROIDAB in the last 72 hours. Anemia Panel: No results for input(s): VITAMINB12, FOLATE, FERRITIN, TIBC, IRON, RETICCTPCT in the last 72 hours. Sepsis Labs: No results for input(s): PROCALCITON, LATICACIDVEN in the last 168 hours.  Recent Results (from the past 240 hour(s))  SARS Coronavirus 2 by RT PCR (hospital order, performed in [facility] hospital lab) Nasopharyngeal Nasopharyngeal Swab     Status: None   Collection Time: 12/16/19  7:10 PM   Specimen: Nasopharyngeal Swab  Result Value Ref Range Status   SARS Coronavirus 2 NEGATIVE NEGATIVE Final    Comment: (NOTE) SARS-CoV-2 target nucleic acids are NOT DETECTED. The SARS-CoV-2 RNA is generally detectable in upper and lower respiratory specimens during the acute phase of infection. The lowest concentration of SARS-CoV-2 viral copies this assay can detect is 250 copies / mL. A negative result does not preclude SARS-CoV-2 infection and should not be used as the sole basis for treatment or other patient management decisions.  A negative result may occur with improper specimen collection / handling, submission of specimen other than nasopharyngeal swab, presence of viral mutation(s) within the areas targeted by this assay, and inadequate number of viral copies (<250 copies / mL). A negative result must be combined with clinical observations, patient history, and epidemiological information. Fact Sheet for Patients:   StrictlyIdeas.no Fact Sheet for Healthcare Providers: BankingDealers.co.za This test is not yet approved or cleared  by the [place] FDA and has been authorized for detection and/or diagnosis of SARS-CoV-2 by FDA under an Emergency Use Authorization (EUA).  This EUA will remain in effect (meaning this test can be used) for the duration of the COVID-19 declaration under Section 564(b)(1) of the Act, 21 U.S.C. section 360bbb-3(b)(1),  unless the authorization is terminated or revoked sooner. Performed at [facility], [place] [address] [place], [place] [address]       Radiology Studies: No results found.      Scheduled Meds: . insulin aspart  0-6 Units Subcutaneous TID WC  . pantoprazole (PROTONIX) IV  40 mg Intravenous Q12H   Continuous Infusions: . sodium chloride 20 mL/hr at 12/18/19 2210     Assessment/Plan:  1. UGIB with acute blood loss anemia : Admitted with NPO/IV PPI/IVF. Home antihypertensives and antiplatelet agents  (asa and plavix) held. Seen by [facility] and underwent Small bowel endoscopy/push enteroscopy  on 5/30 revealing erosive gastritis, angiectasia in duodenum and jejunum requiring APC. Received 2 units of PRBC transfusion so far. Post procedure patient started on CLD,contemplating Video capsule endoscopy study today . Hgb stable 8.2-8.5. Resume diet when okay per GI  2. ESRD: on MWF schedule. Hypokalemia on labs , replaced. Scheduled for HD today  3. Hypertension, presented with hypotension in the setting of GI bleed Home blood pressure medications- Coreg and lisinopril on hold  4. Insulin-dependent type 2 diabetes, well controlled.On sliding scale here  5. PVD status post bilateral AKA.Held aspirin Plavix in the setting of GI bleed  6. Legally blind: has family support  DVT prophylaxis: s/p bilat AKA Code Status: Full Family / Patient Communication: sister Disposition Plan:   Status is: Inpatient  Remains inpatient appropriate because:Ongoing diagnostic testing needed not appropriate for outpatient work up   Dispo: The patient is from: Home  Anticipated d/c is to: Home  Anticipated d/c date is: 1 day  Patient currently is not medically stable to d/c. GI clearance for d/c pending   LOS: 3 days    Time spent: 25 minutes    Guilford Shi, MD [facility] Pager in [place]  If 7PM-7AM, please contact  night-coverage [email or web address] 12/19/2019, 8:19 AM

## 2019-12-19 NOTE — Progress Notes (Signed)
[facility] Progress Note  Subjective: seen in room, no c/o today  Vitals:   12/19/19 0410 12/19/19 0414 12/19/19 0528 12/19/19 1007  BP:  (!) 139/44  (!) 104/46  Pulse:  77 77 79  Resp:  17 13 18   Temp:  98.6 F (37 C)  98.4 F (36.9 C)  TempSrc:  Oral  Oral  SpO2:  98% 93%   Weight: 74 kg       Exam:  alert, nad   no jvd  Chest cta bilat  Cor reg no RG  Abd soft ntnd no ascites   Ext no LE edema, bilat AKA   Alert, NF, ox3  RUA AVF+bruit     OP HD: [place] MWF  4h  73.5kg  2/2.25 bath  Hep none RUA AVF  - hect 1 ug  - mircera 225 q2 last 5/24   Assessment/ Plan: 1. Symptomatic lower Gi Bleed = HGB 5.8 , sp 2u 5/29 > Hb 8.5 today. GI is following.    2. ESRD -HD MWF. HD today.  3. Hypertension/volume - poss pulm edema on admit CXR, 1.7 L off last HD , on room air and stable now 4. 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 Metabolic bone disease -binder when po diet , Hec on MWF hd  corec ca 9.3  6. PAD = sp recent Bilat AKA this year. Holding Plavix Per admit  7. DM Type 2 = per admit     Rob Beckett Maden 12/19/2019, 11:45 AM   Recent Labs  Lab 12/16/19 1652 12/16/19 1652 12/17/19 0446 12/17/19 1041 12/18/19 0259 12/19/19 0301  K 3.4*  --  3.9  --   --   --   BUN 15  --  18  --   --   --   CREATININE 3.60*  --  4.15*  --   --   --   CALCIUM 8.3*  --  8.3*  --   --   --   HGB 5.8*   < > 6.7*   < > 8.2* 8.5*   < > = values in this interval not displayed.   Inpatient medications: . insulin aspart  0-6 Units Subcutaneous TID WC  . pantoprazole (PROTONIX) IV  40 mg Intravenous Q12H   . sodium chloride 20 mL/hr at 12/18/19 2210   albuterol, ondansetron **OR** ondansetron (ZOFRAN) IV

## 2019-12-20 DIAGNOSIS — Z992 Dependence on renal dialysis: Secondary | ICD-10-CM | POA: Diagnosis not present

## 2019-12-20 DIAGNOSIS — N186 End stage renal disease: Secondary | ICD-10-CM | POA: Diagnosis not present

## 2019-12-20 DIAGNOSIS — D62 Acute posthemorrhagic anemia: Secondary | ICD-10-CM

## 2019-12-20 DIAGNOSIS — E1129 Type 2 diabetes mellitus with other diabetic kidney complication: Secondary | ICD-10-CM | POA: Diagnosis not present

## 2019-12-20 DIAGNOSIS — I998 Other disorder of circulatory system: Secondary | ICD-10-CM

## 2019-12-20 DIAGNOSIS — K31811 Angiodysplasia of stomach and duodenum with bleeding: Principal | ICD-10-CM

## 2019-12-20 LAB — GLUCOSE, CAPILLARY
Glucose-Capillary: 106 mg/dL — ABNORMAL HIGH (ref 70–99)
Glucose-Capillary: 83 mg/dL (ref 70–99)
Glucose-Capillary: 155 mg/dL — ABNORMAL HIGH (ref 70–99)

## 2019-12-20 LAB — RENAL FUNCTION PANEL
Albumin: 2.6 g/dL — ABNORMAL LOW (ref 3.5–5.0)
Anion gap: 14 (ref 5–15)
BUN: 25 mg/dL — ABNORMAL HIGH (ref 8–23)
CO2: 24 mmol/L (ref 22–32)
Calcium: 8.8 mg/dL — ABNORMAL LOW (ref 8.9–10.3)
Chloride: 98 mmol/L (ref 98–111)
Creatinine, Ser: 6.99 mg/dL — ABNORMAL HIGH (ref 0.44–1.00)
GFR calc Af Amer: 6 mL/min — ABNORMAL LOW (ref >60)
GFR calc non Af Amer: 6 mL/min — ABNORMAL LOW (ref >60)
Glucose, Bld: 113 mg/dL — ABNORMAL HIGH (ref 70–99)
Phosphorus: 6.5 mg/dL — ABNORMAL HIGH (ref 2.5–4.6)
Potassium: 4.3 mmol/L (ref 3.5–5.1)
Sodium: 136 mmol/L (ref 135–145)

## 2019-12-20 LAB — CBC
HCT: 26.9 % — ABNORMAL LOW (ref 36.0–46.0)
Hemoglobin: 8.5 g/dL — ABNORMAL LOW (ref 12.0–15.0)
MCH: 32.7 pg (ref 26.0–34.0)
MCHC: 31.6 g/dL (ref 30.0–36.0)
MCV: 103.5 fL — ABNORMAL HIGH (ref 80.0–100.0)
Platelets: 275 10*3/uL (ref 150–400)
RBC: 2.6 MIL/uL — ABNORMAL LOW (ref 3.87–5.11)
RDW: 21.1 % — ABNORMAL HIGH (ref 11.5–15.5)
WBC: 7 10*3/uL (ref 4.0–10.5)
nRBC: 0 % (ref 0.0–0.2)

## 2019-12-20 LAB — HEPATITIS B SURFACE ANTIGEN: Hepatitis B Surface Ag: NONREACTIVE

## 2019-12-20 MED ORDER — CARVEDILOL 6.25 MG PO TABS: 6.2500 mg | ORAL_TABLET | Freq: Two times a day (BID) | ORAL | 0 refills | Status: DC

## 2019-12-20 MED ORDER — CHLORHEXIDINE GLUCONATE CLOTH 2 % EX PADS: 6.0000 | MEDICATED_PAD | Freq: Every day | CUTANEOUS | Status: DC

## 2019-12-20 NOTE — Discharge Summary (Signed)
Physician Discharge Summary  [name] HYQ:[id number] DOB: 04/06/1951 DOA: 12/16/2019  PCP: Prince Solian, MD  Admit date: 12/16/2019 Discharge date: 12/20/2019 Consultations: Gastroenterology, nephrology Admitted From: home Disposition: home  Discharge Diagnoses:  Principal Problem:   GIB (gastrointestinal bleeding) Active Problems:   Acute blood loss anemia   ESRD on dialysis ([place])   Type 2 diabetes mellitus with diabetic polyneuropathy ([place])   Peripheral artery disease ([place])   Hypotension   Hospital Course Summary: 69 y.o.F with h/oESRD on HD, PADs/pbilateralAKA, HTN, DM type II,legally blind, esophageal ulcer, and GI bleed presentedwith c/odizziness andafter hemoglobin noted to be around 5uponhemodialysis lab draw. Family members noticed dark stools. Patient does not use any NSAIDs, but is on Plavix and aspirin. Last EGD was in2017showedsingle bleeding duodenal angioectasia that was treated with APCand hemostatic clips ED Course: Afebrile, BP71/59 with IV fluidsimprovedto 106/42.Labs significant for hemoglobin 5.8, potassium 3.4, BUN 15,andcreatinine 3.6. Stool guaiacs were noted to be positive. Chest x-ray revealed cardiomegaly with developing pulmonary edema. Patientrecieved1 unit of packed red blood cells. Patient had been given 500 mL of IV fluids, and 2 mg of Protonix IV. Hospital course: Patient admitted to [facility] with [place] consultation  1. UGIB with acute blood loss anemia : Admitted with NPO/IV PPI/IVF. Home antihypertensives and antiplatelet agents (asa and plavix) held. Seen by [facility] and underwentsmall bowel endoscopy/push enteroscopy on 5/30 revealing erosive gastritis, angiectasia in duodenum and jejunum requiring APC. Received 2 units of PRBCtransfusion. Subsequently Hgb stable 8.2-8.5.Post procedure patient started on CLD, underwent Video capsule endoscopy study which only showed 2 small nonbleeding AVMs in mid small bowel  otherwise unremarkable exam .  Resumed diet and okay to resume ASA as well as Plavix in am per d/w GI, Dr Silverio Decamp.  2. ESRD:on MWF schedule. Hypokalemia on labs , replaced. Scheduled for HD today  3.Hypertension, presented with hypotension in the setting of GI bleed. Home blood pressure medications-Coreg 912.5 mg) and lisinopril (20 mg)  held initially but resumed coreg at lower doses upon discharge as SBP fluctuating 100-140 . Can resume lisinopril if BP remains stable upon nephrology f/u .   4.Insulin-dependent type 2 diabetes, well controlled.On sliding scale while here  5.PVD status post bilateral AKA.Held aspirin Plavix in the setting of GI bleed, okay to resume from tomorrow  6.Legally blind: has family support  Discharge Exam:   Vitals:   12/20/19 1000 12/20/19 1006 12/20/19 1135 12/20/19 1550  BP: 112/76 (!) 141/42 91/79 (!) 107/42  Pulse: (!) 50 76 76 78  Resp:  16 15 20   Temp:  98.4 F (36.9 C) 98.2 F (36.8 C) 98 F (36.7 C)  TempSrc:  Oral Oral Oral  SpO2:  98% 95% 100%  Weight:  71.5 kg    Height:        General: Pt is alert, awake, not in acute distress Cardiovascular: RRR, S1/S2 +, no rubs, no gallops Respiratory: CTA bilaterally, no wheezing, no rhonchi Abdominal: Soft, NT, ND, bowel sounds + Extremities: s/p bilateral BKA Discharge Condition:Stable CODE STATUS: Diet recommendation: Recommendations for Outpatient Follow-up:  1. Follow up with PCP: 5-7 days,  2. Follow up with consultants: GI Dr Carlean Purl in 2 weeks, Orthopedics for staple removal as scheduled, nephrology as scheduled for dialysis 3. Please obtain follow up labs including: CBC/BMP  Home Health services upon discharge:  Equipment/Devices upon discharge:   Discharge Instructions:  Discharge Instructions    Call MD for:  extreme fatigue   Complete by: As directed    Call  MD for:  persistant dizziness or light-headedness   Complete by: As directed    Call MD for:  persistant  nausea and vomiting   Complete by: As directed    Call MD for:  severe uncontrolled pain   Complete by: As directed    Call MD for:  temperature >100.4   Complete by: As directed    Diet - low sodium heart healthy   Complete by: As directed    Increase activity slowly   Complete by: As directed      Allergies as of 12/20/2019      Reactions   Tape Itching, Rash   32M Transpore adhesive tape. Medical tape pulls off the skin!! PAPER TAPE ONLY, PLEASE   Latex Hives   Oxycodone Other (See Comments)   Hallucinations    Tramadol Other (See Comments)   Hallucinations with a full tablet   Morphine And Related Other (See Comments)   Hallucinations   Vicodin [hydrocodone-acetaminophen] Other (See Comments)   Hallucinations      Medication List    STOP taking these medications   acetaminophen 500 MG tablet Commonly known as: TYLENOL   levofloxacin 500 MG tablet Commonly known as: LEVAQUIN   lisinopril 40 MG tablet Commonly known as: ZESTRIL     TAKE these medications   albuterol 108 (90 Base) MCG/ACT inhaler Commonly known as: VENTOLIN HFA Inhale 2 puffs into the lungs every 6 (six) hours as needed for wheezing or shortness of breath.   albuterol (2.5 MG/32ML) 0.083% nebulizer solution Commonly known as: PROVENTIL Take 3 mLs (2.5 mg total) by nebulization every 6 (six) hours as needed for wheezing or shortness of breath.   aspirin EC 81 MG tablet Take 81 mg by mouth daily.   atropine 1 % ophthalmic solution Place 1 drop into the right eye 2 (two) times daily.   B-D SINGLE USE SWABS REGULAR Pads in the morning, at noon, and at bedtime.   BD Pen Needle Nano 2nd Gen 32G X 4 MM Misc Generic drug: Insulin Pen Needle in the morning, at noon, and at bedtime.   carvedilol 6.25 MG tablet Commonly known as: COREG Take 1 tablet (6.25 mg total) by mouth 2 (two) times daily. What changed:   medication strength  how much to take   clopidogrel 75 MG tablet Commonly known as:  PLAVIX TAKE 1 TABLET BY MOUTH EVERY DAY What changed: Another medication with the same name was removed. Continue taking this medication, and follow the directions you see here.   darbepoetin 200 MCG/0.4ML Soln injection Commonly known as: ARANESP Inject 0.4 mLs (200 mcg total) into the vein every Wednesday with hemodialysis.   diclofenac sodium 1 % Gel Commonly known as: VOLTAREN Apply 2 g topically 4 (four) times daily as needed (pain).   diphenhydrAMINE 25 MG tablet Commonly known as: BENADRYL Take 25 mg by mouth in the morning, at noon, and at bedtime.   docusate sodium 100 MG capsule Commonly known as: COLACE Take 100 mg by mouth 2 (two) times daily with a meal.   doxercalciferol 4 MCG/2ML injection Commonly known as: HECTOROL Inject 0.5 mLs (1 mcg total) into the vein every Monday, Wednesday, and Friday with hemodialysis.   DULoxetine 30 MG capsule Commonly known as: CYMBALTA Take 30 mg by mouth daily.   fluticasone 50 MCG/ACT nasal spray Commonly known as: FLONASE Place 2 sprays into both nostrils daily as needed for allergies.   gabapentin 100 MG capsule Commonly known as: NEURONTIN Take 100  mg by mouth 2 (two) times daily.   lanthanum 1000 MG chewable tablet Commonly known as: FOSRENOL Chew 2,000-4,000 mg by mouth See admin instructions. Chew 4,000 mg by mouth with each meal and 2,000 mg with each snack   NovoLOG FlexPen 100 UNIT/ML FlexPen Generic drug: insulin aspart Inject 2-10 Units into the skin 2 (two) times daily with a meal.   omeprazole 40 MG capsule Commonly known as: PRILOSEC Take 1 capsule (40 mg total) by mouth daily. What changed: when to take this   OneTouch Verio test strip Generic drug: glucose blood 1 each by Other route 3 (three) times daily.   prednisoLONE acetate 1 % ophthalmic suspension Commonly known as: PRED FORTE Place 1 drop into the right eye 2 (two) times daily.   traMADol 50 MG tablet Commonly known as: Ultram Take 1  tablet (50 mg total) by mouth every 6 (six) hours as needed. What changed:   how much to take  reasons to take this      Follow-up Information    [name], MD Follow up on 01/24/2020.   Specialty: Gastroenterology Why: 10:50 AM.  follow up with GI doctor.   Contact information: [address] [address] [phone]          Allergies  Allergen Reactions  . Tape Itching and Rash    51M Transpore adhesive tape. Medical tape pulls off the skin!! PAPER TAPE ONLY, PLEASE  . Latex Hives  . Oxycodone Other (See Comments)    Hallucinations   . Tramadol Other (See Comments)    Hallucinations with a full tablet  . Morphine And Related Other (See Comments)    Hallucinations   . Vicodin [Hydrocodone-Acetaminophen] Other (See Comments)    Hallucinations       The results of significant diagnostics from this hospitalization (including imaging, microbiology, ancillary and laboratory) are listed below for reference.    Labs: BNP (last 3 results) No results for input(s): BNP in the last 8760 hours. Basic Metabolic Panel: Recent Labs  Lab 12/16/19 1652 12/17/19 0446 12/20/19 0915  NA 138 138 136  K 3.4* 3.9 4.3  CL 95* 97* 98  CO2 28 29 24   GLUCOSE 136* 116* 113*  BUN 15 18 25*  CREATININE 3.60* 4.15* 6.99*  CALCIUM 8.3* 8.3* 8.8*  PHOS  --   --  6.5*   Liver Function Tests: Recent Labs  Lab 12/16/19 1652 12/20/19 0915  AST 45*  --   ALT 26  --   ALKPHOS 188*  --   BILITOT 1.0  --   PROT 5.7*  --   ALBUMIN 2.4* 2.6*   Recent Labs  Lab 12/16/19 1701  LIPASE 32   No results for input(s): AMMONIA in the last 168 hours. CBC: Recent Labs  Lab 12/16/19 1652 12/16/19 1652 12/17/19 0446 12/17/19 0446 12/17/19 1041 12/17/19 1540 12/18/19 0259 12/19/19 0301 12/20/19 0914  WBC 8.9  --  8.0  --   --   --  8.2 9.3 7.0  HGB 5.8*   < > 6.7*   < > 7.9* 8.3* 8.2* 8.5* 8.5*  HCT 19.1*   < > 21.3*   < > 24.9* 26.4* 26.4* 26.9* 26.9*  MCV  106.7*  --  103.4*  --   --   --  103.9* 104.3* 103.5*  PLT 297  --  277  --   --   --  265 277 275   < > = values in this  interval not displayed.   Cardiac Enzymes: No results for input(s): CKTOTAL, CKMB, CKMBINDEX, TROPONINI in the last 168 hours. BNP: Invalid input(s): POCBNP CBG: Recent Labs  Lab 12/19/19 1213 12/19/19 1637 12/19/19 2116 12/20/19 0624 12/20/19 1139  GLUCAP 96 101* 107* 106* 83   D-Dimer No results for input(s): DDIMER in the last 72 hours. Hgb A1c No results for input(s): HGBA1C in the last 72 hours. Lipid Profile No results for input(s): CHOL, HDL, LDLCALC, TRIG, CHOLHDL, LDLDIRECT in the last 72 hours. Thyroid function studies No results for input(s): TSH, T4TOTAL, T3FREE, THYROIDAB in the last 72 hours.  Invalid input(s): FREET3 Anemia work up No results for input(s): VITAMINB12, FOLATE, FERRITIN, TIBC, IRON, RETICCTPCT in the last 72 hours. Urinalysis    Component Value Date/Time   COLORURINE YELLOW 02/06/2018 1709   APPEARANCEUR HAZY (A) 02/06/2018 1709   LABSPEC 1.013 02/06/2018 1709   LABSPEC 1.025 02/13/2011 1533   PHURINE 8.0 02/06/2018 1709   GLUCOSEU 50 (A) 02/06/2018 1709   HGBUR NEGATIVE 02/06/2018 1709   BILIRUBINUR NEGATIVE 02/06/2018 1709   BILIRUBINUR Negative 02/13/2011 1533   KETONESUR NEGATIVE 02/06/2018 1709   PROTEINUR 100 (A) 02/06/2018 1709   UROBILINOGEN 0.2 04/28/2011 1045   NITRITE NEGATIVE 02/06/2018 1709   LEUKOCYTESUR NEGATIVE 02/06/2018 1709   LEUKOCYTESUR Negative 02/13/2011 1533   Sepsis Labs Invalid input(s): PROCALCITONIN,  WBC,  LACTICIDVEN Microbiology Recent Results (from the past 240 hour(s))  SARS Coronavirus 2 by RT PCR (hospital order, performed in [facility] hospital lab) Nasopharyngeal Nasopharyngeal Swab     Status: None   Collection Time: 12/16/19  7:10 PM   Specimen: Nasopharyngeal Swab  Result Value Ref Range Status   SARS Coronavirus 2 NEGATIVE NEGATIVE Final    Comment: (NOTE) SARS-CoV-2  target nucleic acids are NOT DETECTED. The SARS-CoV-2 RNA is generally detectable in upper and lower respiratory specimens during the acute phase of infection. The lowest concentration of SARS-CoV-2 viral copies this assay can detect is 250 copies / mL. A negative result does not preclude SARS-CoV-2 infection and should not be used as the sole basis for treatment or other patient management decisions.  A negative result may occur with improper specimen collection / handling, submission of specimen other than nasopharyngeal swab, presence of viral mutation(s) within the areas targeted by this assay, and inadequate number of viral copies (<250 copies / mL). A negative result must be combined with clinical observations, patient history, and epidemiological information. Fact Sheet for Patients:   StrictlyIdeas.no Fact Sheet for Healthcare Providers: BankingDealers.co.za This test is not yet approved or cleared  by the [place] FDA and has been authorized for detection and/or diagnosis of SARS-CoV-2 by FDA under an Emergency Use Authorization (EUA).  This EUA will remain in effect (meaning this test can be used) for the duration of the COVID-19 declaration under Section 564(b)(1) of the Act, 21 U.S.C. section 360bbb-3(b)(1), unless the authorization is terminated or revoked sooner. Performed at [facility], [place] [address], [place], [place] [address]     Procedures/Studies: DG Chest 2 View  Result Date: 12/13/2019 CLINICAL DATA:  Dyspnea EXAM: CHEST - 2 VIEW COMPARISON:  12/05/2019, chest CT 05/07/2018 FINDINGS: Radiopaque material within the colon. Chronic elevation of left diaphragm. Cardiomegaly. No focal opacity or pleural effusion. Diffusely calcified aorta as before. No pneumothorax. IMPRESSION: Cardiomegaly without overt pulmonary edema or pleural effusion. Electronically Signed   By: Donavan Foil M.D.   On: 12/13/2019 15:55    DG Chest 2 View  Result Date: 12/05/2019 CLINICAL DATA:  Chest pain EXAM: CHEST - 2 VIEW COMPARISON:  May 06, 2018 chest radiograph; chest CT May 07 2018 FINDINGS: There is ill-defined opacity in the lateral right lung base. The lungs elsewhere are clear. Heart is enlarged with pulmonary vascularity normal. There is aortic atherosclerosis. No evident adenopathy. No bone lesions. IMPRESSION: Ill-defined opacity lateral right base consistent with focal pneumonia. Lungs elsewhere clear. There is cardiomegaly with pulmonary vascularity normal. There is extensive aortic atherosclerosis. No adenopathy. Electronically Signed   By: Lowella Grip III M.D.   On: 12/05/2019 14:21   NM Pulmonary Perf and Vent  Result Date: 12/13/2019 CLINICAL DATA:  Recent surgery.  Shortness of breath EXAM: NUCLEAR MEDICINE PERFUSION LUNG SCAN TECHNIQUE: Perfusion images were obtained in multiple projections after intravenous injection of radiopharmaceutical. Ventilation scans intentionally deferred if perfusion scan and chest x-ray adequate for interpretation during COVID 19 epidemic. Views: Anterior, posterior, left lateral, right lateral, RPO, LPO, RAO, LAO RADIOPHARMACEUTICALS:  1.6 mCi Tc-65m MAA IV COMPARISON:  Chest radiograph Dec 13, 2019. FINDINGS: Radiotracer uptake bilaterally is homogeneous and symmetric. No perfusion defects evident. There is cardiomegaly. IMPRESSION: No perfusion defects evident. Very low probability of pulmonary embolus. Cardiomegaly noted. Electronically Signed   By: Lowella Grip III M.D.   On: 12/13/2019 16:20   DG Chest Portable 1 View  Result Date: 12/16/2019 CLINICAL DATA:  Shortness of breath. EXAM: PORTABLE CHEST 1 VIEW COMPARISON:  Dec 13, 2019 FINDINGS: There is stable cardiomegaly with vascular congestion and developing pulmonary edema. Advanced vascular calcifications are noted of the thoracic aorta. There is no pneumothorax or significant pleural effusion. There is no  focal infiltrate. There is no acute osseous abnormality. IMPRESSION: Cardiomegaly with developing pulmonary edema. Electronically Signed   By: Constance Holster M.D.   On: 12/16/2019 18:16    Time coordinating discharge: Over 30 minutes  SIGNED:   Guilford Shi, MD  [facility] 12/20/2019, 4:12 PM

## 2019-12-20 NOTE — Progress Notes (Signed)
Pt received from hemodialysis. VSS. Call bell in reach. Will continue to monitor.  Avonne Berkery R Shade Rivenbark, RN  

## 2019-12-20 NOTE — Progress Notes (Signed)
[facility] Progress Note  Subjective: seen on HD, no new c/o  Vitals:   12/20/19 0930 12/20/19 1000 12/20/19 1006 12/20/19 1135  BP: (!) 146/36 112/76 (!) 141/42 91/79  Pulse: 75 (!) 50 76 76  Resp:   16 15  Temp:   98.4 F (36.9 C) 98.2 F (36.8 C)  TempSrc:   Oral Oral  SpO2:   98% 95%  Weight:   71.5 kg   Height:        Exam:  alert, nad   no jvd  Chest cta bilat  Cor reg no RG  Abd soft ntnd no ascites   Ext no LE edema, bilat AKA   Alert, NF, ox3  RUA AVF+bruit     OP HD: [place] M[place]  4h  73.5kg  2/2.25 bath  Hep none RUA AVF  - hect 1 ug  - mircera 225 q2 last 5/24   Assessment/ Plan: 1. Symptomatic lower Gi Bleed = HGB 5.8 , sp 2u 5/29 > Hb 8.5 yest and today. Per pmd / GI.  2. ESRD -HD MWF. HD Wed/ tomorrow   3. Hypertension/volume - poss pulm edema on admit CXR, sp HD x2 here and wt's are down under dry now. Breathing not an issue now.  4. 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 Metabolic bone disease -binder when po diet , Hec on MWF hd  corec ca 9.3  6. PAD = sp recent Bilat AKA this year. Holding Plavix Per admit  7. DM Type 2 = per admit     Rob Takaya Hyslop 12/20/2019, 1:24 PM   Recent Labs  Lab 12/17/19 0446 12/17/19 1041 12/19/19 0301 12/20/19 0914 12/20/19 0915  K 3.9  --   --   --  4.3  BUN 18  --   --   --  25*  CREATININE 4.15*  --   --   --  6.99*  CALCIUM 8.3*  --   --   --  8.8*  PHOS  --   --   --   --  6.5*  HGB 6.7*   < > 8.5* 8.5*  --    < > = values in this interval not displayed.   Inpatient medications: . insulin aspart  0-6 Units Subcutaneous TID WC  . pantoprazole (PROTONIX) IV  40 mg Intravenous Q12H    albuterol, ondansetron **OR** ondansetron (ZOFRAN) IV

## 2019-12-20 NOTE — Progress Notes (Signed)
Discharge instructions given to patient. IV removed, clean and intact. Medications reviewed with patient and sister. All questions answered. Pt escorted home with sister.  Arletta Bale, RN

## 2019-12-20 NOTE — Progress Notes (Addendum)
Daily Rounding Note  12/20/2019, 12:16 PM  LOS: 4 days   SUBJECTIVE:   Chief complaint: anemia, gi bleeding (melena, fobt+)    No nausea, no abd pain.  Eating 75 to 100% of clears .  Stool yest not recorded, unclear what color etc.   Really wants to go home and do fup as outpt  OBJECTIVE:         Vital signs in last 24 hours:    Temp:  [98.2 F (36.8 C)-99.1 F (37.3 C)] 98.2 F (36.8 C) (06/01 1135) Pulse Rate:  [50-80] 76 (06/01 1135) Resp:  [15-20] 15 (06/01 1135) BP: (91-146)/(30-79) 91/79 (06/01 1135) SpO2:  [95 %-100 %] 95 % (06/01 1135) Weight:  [67.2 kg-74 kg] 71.5 kg (06/01 1006) Last BM Date: 12/19/19 Filed Weights   12/20/19 0504 12/20/19 0700 12/20/19 1006  Weight: 67.2 kg 74 kg 71.5 kg   General: looks chronically unwell, cushingoid   Heart: RRR Chest: clear bil.  No dyspnea Abdomen: soft, NT, active BS  Extremities: bil AKA Neuro/Psych:  Alert, appropriate.  Blind bil.    Intake/Output from previous day: 05/31 0701 - 06/01 0700 In: 240 [P.O.:240] Out: -   Intake/Output this shift: Total I/O In: -  Out: 2501 [Other:2501]  Lab Results: Recent Labs    12/18/19 0259 12/19/19 0301 12/20/19 0914  WBC 8.2 9.3 7.0  HGB 8.2* 8.5* 8.5*  HCT 26.4* 26.9* 26.9*  PLT 265 277 275   BMET Recent Labs    12/20/19 0915  NA 136  K 4.3  CL 98  CO2 24  GLUCOSE 113*  BUN 25*  CREATININE 6.99*  CALCIUM 8.8*   LFT Recent Labs    12/20/19 0915  ALBUMIN 2.6*   PT/INR No results for input(s): LABPROT, INR in the last 72 hours. Hepatitis Panel Recent Labs    12/20/19 0721  HEPBSAG NON REACTIVE    Studies/Results: No results found.   Scheduled Meds: . [START ON 12/21/2019] Chlorhexidine Gluconate Cloth  6 each Topical Q0600  . insulin aspart  0-6 Units Subcutaneous TID WC  . pantoprazole (PROTONIX) IV  40 mg Intravenous Q12H   Continuous Infusions: PRN Meds:.albuterol, ondansetron  **OR** ondansetron (ZOFRAN) IV '  ASSESMENT:   *  GI bleed, blood loss anemia.  Recurrent.   12/18/19 Enteroscopy. Gastric erosions no active bleeding or signs recent bleeding.  Single, non-bleeding jejunal AVM couterized w APC.  Tattoo placed at terminus of scoped jejunum.    *   13 adenomatous colon polyps on initial screening 03/2018. Pt did not respond to phone calls or letter sent re 1 yr fup colonoscopy     *   Chronic plavix (since 11/2018), ASA for vascular disease.  Marland Kitchen     PLAN   *   Await results of VCE, data recorder picked up this AM and images downloaded. ? colonoscopy  inpt vs outpt?   *   Renal, carb mod diet.    *   has fup ov arranged w Dr Carlean Purl for 7/6.  *   ? If/when to restart Plavix.          Azucena Freed  12/20/2019, 12:16 PM Phone [phone]   Attending physician's note   I have taken an interval history, reviewed the chart and examined the patient. I agree with the Advanced Practitioner's note, impression and recommendations.   Enteroscopy Dec 18, 2019 with gastric erosion and small nonbleeding jejunal AVM  cauterized with APC  Video capsule endoscopy with no evidence of active bleeding, noted 2 small nonbleeding AVMs in mid small bowel otherwise unremarkable exam  Hemoglobin remained stable Advance diet as tolerated  Okay to restart Plavix tomorrow  Will need routine monitoring of CBC, IV iron and thrombopoietin through dialysis sessions Follow-up with Dr. Carlean Purl as outpatient    Damaris Hippo , MD [phone]

## 2019-12-20 NOTE — Progress Notes (Signed)
Hemodialysis Held orders released at [place] labs sent to lab at [place]. Orders remian showing active at this time lab telephoned, states there is no order for cbc/renal. For some reason lab unable to see released held orders. New order entered into epic. Labs states they see new orders and will process.

## 2019-12-21 ENCOUNTER — Telehealth: Payer: Self-pay | Admitting: Cardiovascular Disease

## 2019-12-21 DIAGNOSIS — D631 Anemia in chronic kidney disease: Secondary | ICD-10-CM | POA: Diagnosis not present

## 2019-12-21 DIAGNOSIS — N2581 Secondary hyperparathyroidism of renal origin: Secondary | ICD-10-CM | POA: Diagnosis not present

## 2019-12-21 DIAGNOSIS — Z992 Dependence on renal dialysis: Secondary | ICD-10-CM | POA: Diagnosis not present

## 2019-12-21 DIAGNOSIS — N186 End stage renal disease: Secondary | ICD-10-CM | POA: Diagnosis not present

## 2019-12-21 NOTE — Telephone Encounter (Signed)
Patient calling for [name] or [name]? She said she wasn't sure who called but would try to listen to the message and get the right name and number. States she will call back.

## 2019-12-22 ENCOUNTER — Other Ambulatory Visit: Payer: Self-pay

## 2019-12-22 ENCOUNTER — Other Ambulatory Visit: Payer: Self-pay | Admitting: *Deleted

## 2019-12-22 ENCOUNTER — Ambulatory Visit (INDEPENDENT_AMBULATORY_CARE_PROVIDER_SITE_OTHER): Payer: Self-pay | Admitting: Physician Assistant

## 2019-12-22 VITALS — BP 115/54 | HR 78 | Temp 98.6°F | Resp 16 | Ht 65.0 in | Wt 157.0 lb

## 2019-12-22 DIAGNOSIS — I739 Peripheral vascular disease, unspecified: Secondary | ICD-10-CM

## 2019-12-22 DIAGNOSIS — Z89612 Acquired absence of left leg above knee: Secondary | ICD-10-CM

## 2019-12-22 DIAGNOSIS — Z89611 Acquired absence of right leg above knee: Secondary | ICD-10-CM

## 2019-12-22 NOTE — Progress Notes (Signed)
POST OPERATIVE OFFICE NOTE    CC:  F/u for surgery  HPI:  This is a 69 y.o. female who is s/p right above knee amputation by Dr. Donnetta Hutching in 11/17/19. She was last seen on 12/12/19 by Arlee Muslim PA-C at which time she reported some drainage from mid right AKA incision. She had some maceration on the mid and lateral incision region without signs of infection. Her staples were not removed due to concerns about dehiscence. She was advised to keep incision clean with soap and water and pat dry and leave open to air. She was scheduled to follow up in 1 week for staple removal  She presents today for incision check. She and her sister report mild clear- yellow drainage in same area as at time of last visit. Her sister feels it is much better and dry compared to last week. 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 She does report tenderness of the medial and lateral most aspects of the incision line  Allergies  Allergen Reactions   Tape Itching and Rash    60M Transpore adhesive tape. Medical tape pulls off the skin!! PAPER TAPE ONLY, PLEASE   Latex Hives   Oxycodone Other (See Comments)    Hallucinations    Tramadol Other (See Comments)    Hallucinations with a full tablet   Morphine And Related Other (See Comments)    Hallucinations    Vicodin [Hydrocodone-Acetaminophen] Other (See Comments)    Hallucinations     Current Outpatient Medications  Medication Sig Dispense Refill   albuterol (PROVENTIL) (2.5 MG/60ML) 0.083% nebulizer solution Take 3 mLs (2.5 mg total) by nebulization every 6 (six) hours as needed for wheezing or shortness of breath. 75 mL 12   albuterol (VENTOLIN HFA) 108 (90 Base) MCG/ACT inhaler Inhale 2 puffs into the lungs every 6 (six) hours as needed for wheezing or shortness of breath.      Alcohol Swabs (B-D SINGLE USE SWABS REGULAR) PADS in the morning, at noon, and at bedtime.      aspirin EC 81 MG tablet Take 81 mg by  mouth daily.     atropine 1 % ophthalmic solution Place 1 drop into the right eye 2 (two) times daily.  0   BD PEN NEEDLE NANO 2ND GEN 32G X 4 MM MISC in the morning, at noon, and at bedtime.      carvedilol (COREG) 6.25 MG tablet Take 1 tablet (6.25 mg total) by mouth 2 (two) times daily. 60 tablet 0   clopidogrel (PLAVIX) 75 MG tablet TAKE 1 TABLET BY MOUTH EVERY DAY (Patient taking differently: Take 75 mg by mouth daily. ) 30 tablet 11   darbepoetin (ARANESP) 200 MCG/0.4ML SOLN injection Inject 0.4 mLs (200 mcg total) into the vein every Wednesday with hemodialysis. 1.68 mL    diclofenac sodium (VOLTAREN) 1 % GEL Apply 2 g topically 4 (four) times daily as needed (pain).      diphenhydrAMINE (BENADRYL) 25 MG tablet Take 25 mg by mouth in the morning, at noon, and at bedtime.      docusate sodium (COLACE) 100 MG capsule Take 100 mg by mouth 2 (two) times daily with a meal.      doxercalciferol (HECTOROL) 4 MCG/2ML injection Inject 0.5 mLs (1 mcg total) into the vein every Monday, Wednesday, and Friday with hemodialysis. 2 mL    DULoxetine (CYMBALTA) 30 MG capsule Take 30 mg by mouth daily.     fluticasone (  FLONASE) 50 MCG/ACT nasal spray Place 2 sprays into both nostrils daily as needed for allergies.      gabapentin (NEURONTIN) 100 MG capsule Take 100 mg by mouth 2 (two) times daily.     insulin aspart (NOVOLOG FLEXPEN) 100 UNIT/ML FlexPen Inject 2-10 Units into the skin 2 (two) times daily with a meal.      lanthanum (FOSRENOL) 1000 MG chewable tablet Chew 2,000-4,000 mg by mouth See admin instructions. Chew 4,000 mg by mouth with each meal and 2,000 mg with each snack     omeprazole (PRILOSEC) 40 MG capsule Take 1 capsule (40 mg total) by mouth daily. (Patient taking differently: Take 40 mg by mouth 2 (two) times daily. )     ONETOUCH VERIO test strip 1 each by Other route 3 (three) times daily.   9   prednisoLONE acetate (PRED FORTE) 1 % ophthalmic suspension Place 1 drop into  the right eye 2 (two) times daily.   0   traMADol (ULTRAM) 50 MG tablet Take 1 tablet (50 mg total) by mouth every 6 (six) hours as needed. (Patient taking differently: Take 25 mg by mouth every 6 (six) hours as needed for moderate pain. ) 20 tablet 0   carvedilol (COREG) 25 MG tablet Take 0.5 tablets (12.5 mg total) by mouth 2 (two) times daily. 30 tablet 0   No current facility-administered medications for this visit.     ROS:  See HPI  Physical Exam: Patient was assisted to clinic chair with family's hoyer lift. Patient's son Legrand Como operated the lift. She was safely transferred in and out of chair to her wheel chair  Vitals:   12/22/19 1205  BP: (!) 115/54  Pulse: 78  Resp: 16  Temp: 98.6 F (37 C)  TempSrc: Oral  SpO2: 96%  Weight: 157 lb (71.2 kg)  Height: 5\' 5"  (1.651 m)    Incision: right above knee amputation with staples present. Mild serous drainage from mid lateral aspect. Drainage does not increase on compression. Feel that it is just superficial drainage from subcutaneous drainage. No appreciable fluid collections. No fluctuance. Medial aspect of incision line healing really well. No longer any areas of maceration. Staples removed and steri strips applied from lateral to mid incision line Extremities: Left aka well healed. No appreciable fluid collections, masses, or wounds in area of patients reported tenderness on medial and lateral aspects of incision Neuro: alert and oriented Abdomen: obese, soft and non tender  Assessment/Plan:  This is a 69 y.o. female who is s/p right above knee amputation by Dr. Donnetta Hutching on 11/17/19. Her right AKA site is healing well. Mild amount of tenderness and some serous drainage from lateral aspect of incision line -All staples removed today and steri strips applied from the  lateral to mid portion of incision line - Recommend continuing to clean incision line with soap and water and pat dry and then leave open to air  - She will follow up  in 1-2 weeks for wound check. She did have a little serous drainage from lateral portion of wound so recommend closer follow up to make sure she is healing adequately - advised patient and her sister to call for earlier follow up if there are any concerns    Karoline Caldwell, PA-C [facility] [phone]  Clinic MD: Dr. Oneida Alar

## 2019-12-22 NOTE — Patient Outreach (Signed)
[place] [facility]) Care Management  12/22/2019  [name] 1951-03-13 [id number]   Telephone Assessment-Unsuccessful Request a call back Primary Provider to completed transition of care d/c 6/1  RN spoke with pt briefly and introduced [facility]. Inquired if this was a good time to talk. Pt states not a good time at all. RN further inquired if she wishes for me to continue following up with her sister [name]) who has be permitted prior on earlier outreach calls. Pt permitted RN to contact her sister however no today. RN offered to call back on Tuesday next week (pt receptive).    Plan: RN will follow up next week for update on pt's ongoing management of care and recent discharged from the hospital. Will update and discuss plan of care at that time.  [name], RN Care Management Coordinator [place] Office [phone]

## 2019-12-23 DIAGNOSIS — N186 End stage renal disease: Secondary | ICD-10-CM | POA: Diagnosis not present

## 2019-12-23 DIAGNOSIS — Z992 Dependence on renal dialysis: Secondary | ICD-10-CM | POA: Diagnosis not present

## 2019-12-23 DIAGNOSIS — N2581 Secondary hyperparathyroidism of renal origin: Secondary | ICD-10-CM | POA: Diagnosis not present

## 2019-12-23 DIAGNOSIS — D631 Anemia in chronic kidney disease: Secondary | ICD-10-CM | POA: Diagnosis not present

## 2019-12-26 ENCOUNTER — Ambulatory Visit: Payer: Self-pay | Admitting: *Deleted

## 2019-12-26 DIAGNOSIS — N2581 Secondary hyperparathyroidism of renal origin: Secondary | ICD-10-CM | POA: Diagnosis not present

## 2019-12-26 DIAGNOSIS — N186 End stage renal disease: Secondary | ICD-10-CM | POA: Diagnosis not present

## 2019-12-26 DIAGNOSIS — Z992 Dependence on renal dialysis: Secondary | ICD-10-CM | POA: Diagnosis not present

## 2019-12-26 DIAGNOSIS — D631 Anemia in chronic kidney disease: Secondary | ICD-10-CM | POA: Diagnosis not present

## 2019-12-27 ENCOUNTER — Other Ambulatory Visit: Payer: Self-pay | Admitting: *Deleted

## 2019-12-27 NOTE — Patient Outreach (Signed)
[place] [facility]) Care Management  12/27/2019  [name] Dec 19, 1950 [id number]   Telephone Assessment-Diabetes  RN spoke with pt and the sister [name]) for updates on pt's ongoing management of care. Caregiver sister [name]) indicates pt is doing much better with her ongoing dialysis M-W-F and completing her daily blood sugars ranging from 115-124 with this AM at 112. Verified pt has received the [facility] packet with emmi educational information on Diabetes in the packet. Encouraged caregiver to review this information with the pt for ongoing management of care. Also encouraged pt/caregiver to document all readings in the calendar for the pt's provides to view. State awareness that pt needs a high protein diet and pt is consuming high protein drink when she can. Also aware that pt needs to consume low carbs with her food items.  Verified [facility] continue to be involved with pt for PT/OT services. Caregiver verified pt's stump wounds (recent AKA) are healing with no signs of infections or delays as pt adherent to all prescribed medication changes. Reports pt is more mobile with her movement and continue to recover well.  Plan of care reviewed and discussed in detail with ongoing goals and interventions adjusted accordingly. Will continue to provide supportive tools and resources to assist pt with meeting her goals in managing her ongoing care.   Plan: Will update the plan of care and follow up next month with ongoing care management services.  [facility] CM Care Plan Problem One     Most Recent Value  Care Plan Problem One  Deficient knowledge related to  diabetes management  Role Documenting the Problem One  Care Management Telephonic Coordinator  Care Plan for Problem One  Active  [facility] Long Term Goal   Pt/caregiver will verbalize Hgb A1c reduction within the next 90 days.  [facility] Long Term Goal Start Date  11/24/19  Interventions for Problem One Long Term Goal  Will continue to stress the  importance of reducing pt's overall A1c to avoid acute events such as unhealing wounds and other risk factors that result from elevated A1c. Will educate accordingly and encouraged pt to review the printed emmi information mail to pt for managing her ongoing diabetes.  [facility] CM Short Term Goal #1   Pt/caregiver will identify two foods to avoid in a diabetic diet within the next 30 day.  [facility] CM Short Term Goal #1 Start Date  11/24/19  Interventions for Short Term Goal #1  Will strongly encouraged pt/caregiver to review the printed material sent on diabetic foods/exchange list for improving thept's eating habits with healthier food items.  Will educate on high protein and low carbs dietary food items.  [facility] CM Short Term Goal #2   Caregiver will monitor blood glucose and document all readings in the [facility] journal/calendar within the next 30 days for her providers to view.  [facility] CM Short Term Goal #2 Start Date  11/24/19  Interventions for Short Term Goal #2  WIll continue to encourage adherence with her daily blood sugar checks and what to do if acute readings are noted. Will continue to encouraged ongoing monitoring and encouraged cargiver to inform pt to document all readings in the thn calender for providers to view. Will continue to encouraged adherence with this goal and re-evaluate next follow up call.       [name], RN Care Management Coordinator [place] Office [phone]

## 2019-12-28 DIAGNOSIS — D631 Anemia in chronic kidney disease: Secondary | ICD-10-CM | POA: Diagnosis not present

## 2019-12-28 DIAGNOSIS — N186 End stage renal disease: Secondary | ICD-10-CM | POA: Diagnosis not present

## 2019-12-28 DIAGNOSIS — N2581 Secondary hyperparathyroidism of renal origin: Secondary | ICD-10-CM | POA: Diagnosis not present

## 2019-12-28 DIAGNOSIS — Z992 Dependence on renal dialysis: Secondary | ICD-10-CM | POA: Diagnosis not present

## 2019-12-30 DIAGNOSIS — E1122 Type 2 diabetes mellitus with diabetic chronic kidney disease: Secondary | ICD-10-CM | POA: Diagnosis not present

## 2019-12-30 DIAGNOSIS — Z89512 Acquired absence of left leg below knee: Secondary | ICD-10-CM | POA: Diagnosis not present

## 2019-12-30 DIAGNOSIS — N186 End stage renal disease: Secondary | ICD-10-CM | POA: Diagnosis not present

## 2019-12-30 DIAGNOSIS — Z89511 Acquired absence of right leg below knee: Secondary | ICD-10-CM | POA: Diagnosis not present

## 2019-12-30 DIAGNOSIS — F419 Anxiety disorder, unspecified: Secondary | ICD-10-CM | POA: Diagnosis not present

## 2019-12-30 DIAGNOSIS — Z853 Personal history of malignant neoplasm of breast: Secondary | ICD-10-CM | POA: Diagnosis not present

## 2019-12-30 DIAGNOSIS — D631 Anemia in chronic kidney disease: Secondary | ICD-10-CM | POA: Diagnosis not present

## 2019-12-30 DIAGNOSIS — Z992 Dependence on renal dialysis: Secondary | ICD-10-CM | POA: Diagnosis not present

## 2019-12-30 DIAGNOSIS — N2581 Secondary hyperparathyroidism of renal origin: Secondary | ICD-10-CM | POA: Diagnosis not present

## 2019-12-30 DIAGNOSIS — D649 Anemia, unspecified: Secondary | ICD-10-CM | POA: Diagnosis not present

## 2019-12-30 DIAGNOSIS — I110 Hypertensive heart disease with heart failure: Secondary | ICD-10-CM | POA: Diagnosis not present

## 2019-12-30 DIAGNOSIS — K219 Gastro-esophageal reflux disease without esophagitis: Secondary | ICD-10-CM | POA: Diagnosis not present

## 2019-12-30 DIAGNOSIS — Z515 Encounter for palliative care: Secondary | ICD-10-CM | POA: Diagnosis not present

## 2020-01-02 DIAGNOSIS — N186 End stage renal disease: Secondary | ICD-10-CM | POA: Diagnosis not present

## 2020-01-02 DIAGNOSIS — Z992 Dependence on renal dialysis: Secondary | ICD-10-CM | POA: Diagnosis not present

## 2020-01-02 DIAGNOSIS — D631 Anemia in chronic kidney disease: Secondary | ICD-10-CM | POA: Diagnosis not present

## 2020-01-02 DIAGNOSIS — I110 Hypertensive heart disease with heart failure: Secondary | ICD-10-CM | POA: Diagnosis not present

## 2020-01-02 DIAGNOSIS — F419 Anxiety disorder, unspecified: Secondary | ICD-10-CM | POA: Diagnosis not present

## 2020-01-02 DIAGNOSIS — N2581 Secondary hyperparathyroidism of renal origin: Secondary | ICD-10-CM | POA: Diagnosis not present

## 2020-01-02 DIAGNOSIS — D649 Anemia, unspecified: Secondary | ICD-10-CM | POA: Diagnosis not present

## 2020-01-02 DIAGNOSIS — E1122 Type 2 diabetes mellitus with diabetic chronic kidney disease: Secondary | ICD-10-CM | POA: Diagnosis not present

## 2020-01-02 DIAGNOSIS — K219 Gastro-esophageal reflux disease without esophagitis: Secondary | ICD-10-CM | POA: Diagnosis not present

## 2020-01-03 DIAGNOSIS — I110 Hypertensive heart disease with heart failure: Secondary | ICD-10-CM | POA: Diagnosis not present

## 2020-01-03 DIAGNOSIS — D649 Anemia, unspecified: Secondary | ICD-10-CM | POA: Diagnosis not present

## 2020-01-03 DIAGNOSIS — E1122 Type 2 diabetes mellitus with diabetic chronic kidney disease: Secondary | ICD-10-CM | POA: Diagnosis not present

## 2020-01-03 DIAGNOSIS — F419 Anxiety disorder, unspecified: Secondary | ICD-10-CM | POA: Diagnosis not present

## 2020-01-03 DIAGNOSIS — N186 End stage renal disease: Secondary | ICD-10-CM | POA: Diagnosis not present

## 2020-01-03 DIAGNOSIS — K219 Gastro-esophageal reflux disease without esophagitis: Secondary | ICD-10-CM | POA: Diagnosis not present

## 2020-01-04 DIAGNOSIS — N2581 Secondary hyperparathyroidism of renal origin: Secondary | ICD-10-CM | POA: Diagnosis not present

## 2020-01-04 DIAGNOSIS — Z992 Dependence on renal dialysis: Secondary | ICD-10-CM | POA: Diagnosis not present

## 2020-01-04 DIAGNOSIS — D631 Anemia in chronic kidney disease: Secondary | ICD-10-CM | POA: Diagnosis not present

## 2020-01-04 DIAGNOSIS — N186 End stage renal disease: Secondary | ICD-10-CM | POA: Diagnosis not present

## 2020-01-05 ENCOUNTER — Ambulatory Visit (INDEPENDENT_AMBULATORY_CARE_PROVIDER_SITE_OTHER): Payer: Self-pay | Admitting: Physician Assistant

## 2020-01-05 ENCOUNTER — Other Ambulatory Visit: Payer: Self-pay

## 2020-01-05 VITALS — BP 130/56 | HR 80 | Temp 97.5°F | Resp 20 | Ht 65.0 in | Wt 154.0 lb

## 2020-01-05 DIAGNOSIS — K219 Gastro-esophageal reflux disease without esophagitis: Secondary | ICD-10-CM | POA: Diagnosis not present

## 2020-01-05 DIAGNOSIS — D649 Anemia, unspecified: Secondary | ICD-10-CM | POA: Diagnosis not present

## 2020-01-05 DIAGNOSIS — N186 End stage renal disease: Secondary | ICD-10-CM | POA: Diagnosis not present

## 2020-01-05 DIAGNOSIS — Z89611 Acquired absence of right leg above knee: Secondary | ICD-10-CM

## 2020-01-05 DIAGNOSIS — I110 Hypertensive heart disease with heart failure: Secondary | ICD-10-CM | POA: Diagnosis not present

## 2020-01-05 DIAGNOSIS — E1122 Type 2 diabetes mellitus with diabetic chronic kidney disease: Secondary | ICD-10-CM | POA: Diagnosis not present

## 2020-01-05 DIAGNOSIS — F419 Anxiety disorder, unspecified: Secondary | ICD-10-CM | POA: Diagnosis not present

## 2020-01-05 NOTE — Progress Notes (Signed)
    Postoperative Visit    History of Present Illness   YUE FLANIGAN is a 69 y.o. female who presents for postoperative follow-up for: right above-the-knee ampuation.(Date: 11/17/19).  The patient's wounds are  healed.  The patient notes pain is well controlled.  She would like a referral to [employer] to begin workup for prosthetic use.   For VQI Use Only   PRE-ADM LIVING: Home  AMB STATUS: Wheelchair   Physical Examination   Vitals:   01/05/20 0820  BP: (!) 130/56  Pulse: 80  Resp: 20  Temp: (!) 97.5 F (36.4 C)  SpO2: 90%    RLE: Stump incision is healed.  No areas of fluctuance   Medical Decision Making   ARMYA WESTERHOFF is a 69 y.o. female who presents s/p right above-the-knee amputation.   R AKA is well healed without any open areas  Ok to start wearing limb shrinking compression  Referral made to [employer]  She may follow up as needed here  Dagoberto Ligas PA-C [facility] [place] Office: [phone]  Clinic MD: Scot Dock

## 2020-01-06 DIAGNOSIS — N2581 Secondary hyperparathyroidism of renal origin: Secondary | ICD-10-CM | POA: Diagnosis not present

## 2020-01-06 DIAGNOSIS — N186 End stage renal disease: Secondary | ICD-10-CM | POA: Diagnosis not present

## 2020-01-06 DIAGNOSIS — D631 Anemia in chronic kidney disease: Secondary | ICD-10-CM | POA: Diagnosis not present

## 2020-01-06 DIAGNOSIS — Z992 Dependence on renal dialysis: Secondary | ICD-10-CM | POA: Diagnosis not present

## 2020-01-09 DIAGNOSIS — D631 Anemia in chronic kidney disease: Secondary | ICD-10-CM | POA: Diagnosis not present

## 2020-01-09 DIAGNOSIS — N186 End stage renal disease: Secondary | ICD-10-CM | POA: Diagnosis not present

## 2020-01-09 DIAGNOSIS — Z992 Dependence on renal dialysis: Secondary | ICD-10-CM | POA: Diagnosis not present

## 2020-01-09 DIAGNOSIS — N2581 Secondary hyperparathyroidism of renal origin: Secondary | ICD-10-CM | POA: Diagnosis not present

## 2020-01-10 DIAGNOSIS — K219 Gastro-esophageal reflux disease without esophagitis: Secondary | ICD-10-CM | POA: Diagnosis not present

## 2020-01-10 DIAGNOSIS — I110 Hypertensive heart disease with heart failure: Secondary | ICD-10-CM | POA: Diagnosis not present

## 2020-01-10 DIAGNOSIS — N186 End stage renal disease: Secondary | ICD-10-CM | POA: Diagnosis not present

## 2020-01-10 DIAGNOSIS — F419 Anxiety disorder, unspecified: Secondary | ICD-10-CM | POA: Diagnosis not present

## 2020-01-10 DIAGNOSIS — D649 Anemia, unspecified: Secondary | ICD-10-CM | POA: Diagnosis not present

## 2020-01-10 DIAGNOSIS — E1122 Type 2 diabetes mellitus with diabetic chronic kidney disease: Secondary | ICD-10-CM | POA: Diagnosis not present

## 2020-01-11 DIAGNOSIS — Z992 Dependence on renal dialysis: Secondary | ICD-10-CM | POA: Diagnosis not present

## 2020-01-11 DIAGNOSIS — N186 End stage renal disease: Secondary | ICD-10-CM | POA: Diagnosis not present

## 2020-01-11 DIAGNOSIS — N2581 Secondary hyperparathyroidism of renal origin: Secondary | ICD-10-CM | POA: Diagnosis not present

## 2020-01-11 DIAGNOSIS — D631 Anemia in chronic kidney disease: Secondary | ICD-10-CM | POA: Diagnosis not present

## 2020-01-12 DIAGNOSIS — K219 Gastro-esophageal reflux disease without esophagitis: Secondary | ICD-10-CM | POA: Diagnosis not present

## 2020-01-12 DIAGNOSIS — N186 End stage renal disease: Secondary | ICD-10-CM | POA: Diagnosis not present

## 2020-01-12 DIAGNOSIS — F419 Anxiety disorder, unspecified: Secondary | ICD-10-CM | POA: Diagnosis not present

## 2020-01-12 DIAGNOSIS — E1122 Type 2 diabetes mellitus with diabetic chronic kidney disease: Secondary | ICD-10-CM | POA: Diagnosis not present

## 2020-01-12 DIAGNOSIS — I110 Hypertensive heart disease with heart failure: Secondary | ICD-10-CM | POA: Diagnosis not present

## 2020-01-12 DIAGNOSIS — D649 Anemia, unspecified: Secondary | ICD-10-CM | POA: Diagnosis not present

## 2020-01-13 DIAGNOSIS — D631 Anemia in chronic kidney disease: Secondary | ICD-10-CM | POA: Diagnosis not present

## 2020-01-13 DIAGNOSIS — N2581 Secondary hyperparathyroidism of renal origin: Secondary | ICD-10-CM | POA: Diagnosis not present

## 2020-01-13 DIAGNOSIS — Z992 Dependence on renal dialysis: Secondary | ICD-10-CM | POA: Diagnosis not present

## 2020-01-13 DIAGNOSIS — N186 End stage renal disease: Secondary | ICD-10-CM | POA: Diagnosis not present

## 2020-01-16 DIAGNOSIS — D631 Anemia in chronic kidney disease: Secondary | ICD-10-CM | POA: Diagnosis not present

## 2020-01-16 DIAGNOSIS — Z992 Dependence on renal dialysis: Secondary | ICD-10-CM | POA: Diagnosis not present

## 2020-01-16 DIAGNOSIS — N2581 Secondary hyperparathyroidism of renal origin: Secondary | ICD-10-CM | POA: Diagnosis not present

## 2020-01-16 DIAGNOSIS — N186 End stage renal disease: Secondary | ICD-10-CM | POA: Diagnosis not present

## 2020-01-17 DIAGNOSIS — I110 Hypertensive heart disease with heart failure: Secondary | ICD-10-CM | POA: Diagnosis not present

## 2020-01-17 DIAGNOSIS — N186 End stage renal disease: Secondary | ICD-10-CM | POA: Diagnosis not present

## 2020-01-17 DIAGNOSIS — K219 Gastro-esophageal reflux disease without esophagitis: Secondary | ICD-10-CM | POA: Diagnosis not present

## 2020-01-17 DIAGNOSIS — D649 Anemia, unspecified: Secondary | ICD-10-CM | POA: Diagnosis not present

## 2020-01-17 DIAGNOSIS — F419 Anxiety disorder, unspecified: Secondary | ICD-10-CM | POA: Diagnosis not present

## 2020-01-17 DIAGNOSIS — E1122 Type 2 diabetes mellitus with diabetic chronic kidney disease: Secondary | ICD-10-CM | POA: Diagnosis not present

## 2020-01-18 DIAGNOSIS — D631 Anemia in chronic kidney disease: Secondary | ICD-10-CM | POA: Diagnosis not present

## 2020-01-18 DIAGNOSIS — N186 End stage renal disease: Secondary | ICD-10-CM | POA: Diagnosis not present

## 2020-01-18 DIAGNOSIS — Z992 Dependence on renal dialysis: Secondary | ICD-10-CM | POA: Diagnosis not present

## 2020-01-18 DIAGNOSIS — N2581 Secondary hyperparathyroidism of renal origin: Secondary | ICD-10-CM | POA: Diagnosis not present

## 2020-01-19 DIAGNOSIS — N186 End stage renal disease: Secondary | ICD-10-CM | POA: Diagnosis not present

## 2020-01-19 DIAGNOSIS — F419 Anxiety disorder, unspecified: Secondary | ICD-10-CM | POA: Diagnosis not present

## 2020-01-19 DIAGNOSIS — Z853 Personal history of malignant neoplasm of breast: Secondary | ICD-10-CM | POA: Diagnosis not present

## 2020-01-19 DIAGNOSIS — Z515 Encounter for palliative care: Secondary | ICD-10-CM | POA: Diagnosis not present

## 2020-01-19 DIAGNOSIS — K219 Gastro-esophageal reflux disease without esophagitis: Secondary | ICD-10-CM | POA: Diagnosis not present

## 2020-01-19 DIAGNOSIS — E1122 Type 2 diabetes mellitus with diabetic chronic kidney disease: Secondary | ICD-10-CM | POA: Diagnosis not present

## 2020-01-19 DIAGNOSIS — Z89512 Acquired absence of left leg below knee: Secondary | ICD-10-CM | POA: Diagnosis not present

## 2020-01-19 DIAGNOSIS — Z89511 Acquired absence of right leg below knee: Secondary | ICD-10-CM | POA: Diagnosis not present

## 2020-01-19 DIAGNOSIS — I110 Hypertensive heart disease with heart failure: Secondary | ICD-10-CM | POA: Diagnosis not present

## 2020-01-19 DIAGNOSIS — D649 Anemia, unspecified: Secondary | ICD-10-CM | POA: Diagnosis not present

## 2020-01-19 DIAGNOSIS — Z992 Dependence on renal dialysis: Secondary | ICD-10-CM | POA: Diagnosis not present

## 2020-01-19 DIAGNOSIS — E1129 Type 2 diabetes mellitus with other diabetic kidney complication: Secondary | ICD-10-CM | POA: Diagnosis not present

## 2020-01-20 DIAGNOSIS — D509 Iron deficiency anemia, unspecified: Secondary | ICD-10-CM | POA: Diagnosis not present

## 2020-01-20 DIAGNOSIS — D631 Anemia in chronic kidney disease: Secondary | ICD-10-CM | POA: Diagnosis not present

## 2020-01-20 DIAGNOSIS — N2581 Secondary hyperparathyroidism of renal origin: Secondary | ICD-10-CM | POA: Diagnosis not present

## 2020-01-20 DIAGNOSIS — N186 End stage renal disease: Secondary | ICD-10-CM | POA: Diagnosis not present

## 2020-01-20 DIAGNOSIS — E875 Hyperkalemia: Secondary | ICD-10-CM | POA: Diagnosis not present

## 2020-01-20 DIAGNOSIS — Z992 Dependence on renal dialysis: Secondary | ICD-10-CM | POA: Diagnosis not present

## 2020-01-22 NOTE — Progress Notes (Signed)
Subjective:  Patient ID: [name], female    DOB: 01-03-1951,  MRN: [id number]  Chief Complaint  Patient presents with  . Wound Check    Pt states left 2nd toe wound is worse, and that her 5th toe is sore because the 4th is rubbing on it.    69 y.o. female presents for wound care. States the 4th toe is looking better, but still hurts.  New today but unknown to patient is a sore to the inside of the right 2nd toe. Unsure how long it has been there.  Review of Systems: Negative except as noted in the HPI. Denies N/V/F/Ch.  Past Medical History:  Diagnosis Date  . Anemia   . Anxiety   . Arthritis    "joints" (06/15/2013)  . Asthma   . 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 Blood transfusion without reported diagnosis   . Breast cancer ([place])    left  . CHF (congestive heart failure) ([place])   . 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 Esophageal ulcer with bleeding   . ESRD (end stage renal disease) ([place])    "suppose to start dialysis today" (06/15/2013)  . Family history of adverse reaction to anesthesia    It took a while for pt sister to wake from anesthesia  . GERD (gastroesophageal reflux disease)   . Glaucoma    blind in both eyes  . Heart murmur    Mild AS, moderate MR, moderate TR 10/30/16 echo  . Hx of adenomatous colonic polyps 04/07/2018  . Hypertension   . Myalgia 12/31/2011  . Neuropathy 12/31/2011  . PAD (peripheral artery disease) (H[facility])    nonviable tissue left lower extremity  . Shortness of breath    "when she doesn't go to dialysis"  . Type II diabetes mellitus (HCC)    Type II    Current Outpatient Medications:  .  albuterol (PROVENTIL) (2.5 MG/3ML) 0.083% nebulizer solution, Take 3 mLs (2.5 mg total) by nebulization every 6 (six) hours as needed for wheezing or shortness of breath., Disp: 75 mL, Rfl: 12 .  Alcohol Swabs (B-D SINGLE USE SWABS REGULAR) PADS, in the morning, at noon, and at bedtime. , Disp: , Rfl:  .   atropine 1 % ophthalmic solution, Place 1 drop into the right eye 2 (two) times daily., Disp: , Rfl: 0 .  BD PEN NEEDLE NANO 2ND GEN 32G X 4 MM MISC, in the morning, at noon, and at bedtime. , Disp: , Rfl:  .  darbepoetin (ARANESP) 200 MCG/0.4ML SOLN injection, Inject 0.4 mLs (200 mcg total) into the vein every Wednesday with hemodialysis., Disp: 1.68 mL, Rfl:  .  diclofenac sodium (VOLTAREN) 1 % GEL, Apply 2 g topically 4 (four) times daily as needed (pain). , Disp: , Rfl:  .  docusate sodium (COLACE) 100 MG capsule, Take 100 mg by mouth 2 (two) times daily with a meal. , Disp: , Rfl:  .  doxercalciferol (HECTOROL) 4 MCG/2ML injection, Inject 0.5 mLs (1 mcg total) into the vein every Monday, Wednesday, and Friday with hemodialysis., Disp: 2 mL, Rfl:  .  fluticasone (FLONASE) 50 MCG/ACT nasal spray, Place 2 sprays into both nostrils daily as needed for allergies. , Disp: , Rfl:  .  gabapentin (NEURONTIN) 100 MG capsule, Take 100 mg by mouth 2 (two) times daily., Disp: , Rfl:  .  insulin aspart (NOVOLOG FLEXPEN) 100 UNIT/ML FlexPen, Inject 2-10 Units into the skin 2 (  two) times daily with a meal. , Disp: , Rfl:  .  lanthanum (FOSRENOL) 1000 MG chewable tablet, Chew 2,000-4,000 mg by mouth See admin instructions. Chew 4,000 mg by mouth with each meal and 2,000 mg with each snack, Disp: , Rfl:  .  omeprazole (PRILOSEC) 40 MG capsule, Take 1 capsule (40 mg total) by mouth daily. (Patient taking differently: Take 40 mg by mouth 2 (two) times daily. ), Disp: , Rfl:  .  ONETOUCH VERIO test strip, 1 each by Other route 3 (three) times daily. , Disp: , Rfl: 9 .  prednisoLONE acetate (PRED FORTE) 1 % ophthalmic suspension, Place 1 drop into the right eye 2 (two) times daily. , Disp: , Rfl: 0 .  albuterol (VENTOLIN HFA) 108 (90 Base) MCG/ACT inhaler, Inhale 2 puffs into the lungs every 6 (six) hours as needed for wheezing or shortness of breath. , Disp: , Rfl:  .  aspirin EC 81 MG tablet, Take 81 mg by mouth  daily., Disp: , Rfl:  .  carvedilol (COREG) 25 MG tablet, Take 0.5 tablets (12.5 mg total) by mouth 2 (two) times daily., Disp: 30 tablet, Rfl: 0 .  carvedilol (COREG) 6.25 MG tablet, Take 1 tablet (6.25 mg total) by mouth 2 (two) times daily., Disp: 60 tablet, Rfl: 0 .  clopidogrel (PLAVIX) 75 MG tablet, TAKE 1 TABLET BY MOUTH EVERY DAY (Patient taking differently: Take 75 mg by mouth daily. ), Disp: 30 tablet, Rfl: 11 .  diphenhydrAMINE (BENADRYL) 25 MG tablet, Take 25 mg by mouth in the morning, at noon, and at bedtime. , Disp: , Rfl:  .  DULoxetine (CYMBALTA) 30 MG capsule, Take 30 mg by mouth daily., Disp: , Rfl:  .  fentaNYL (DURAGESIC) 12 MCG/HR, 1 patch every 3 (three) days., Disp: , Rfl:  .  multivitamin (RENA-VIT) TABS tablet, Take 1 tablet by mouth daily., Disp: , Rfl:  .  SENNA LAXATIVE 8.6 MG tablet, SMARTSIG:1-2 Tablet(s) By Mouth PRN, Disp: , Rfl:  .  traMADol (ULTRAM) 50 MG tablet, Take 1 tablet (50 mg total) by mouth every 6 (six) hours as needed. (Patient taking differently: Take 25 mg by mouth every 6 (six) hours as needed for moderate pain. ), Disp: 20 tablet, Rfl: 0  Social History   Tobacco Use  Smoking Status Never Smoker  Smokeless Tobacco Never Used    Allergies  Allergen Reactions  . Tape Itching and Rash    67M Transpore adhesive tape. Medical tape pulls off the skin!! PAPER TAPE ONLY, PLEASE  . Latex Hives  . Oxycodone Other (See Comments)    Hallucinations   . Tramadol Other (See Comments)    Hallucinations with a full tablet  . Morphine And Related Other (See Comments)    Hallucinations   . Vicodin [Hydrocodone-Acetaminophen] Other (See Comments)    Hallucinations    Objective:  There were no vitals filed for this visit. There is no height or weight on file to calculate BMI. Constitutional Well developed. Well nourished.  Vascular  left foot cool to touch with poorly palpable pulses Capillary refill normal to all digits.  No cyanosis or clubbing  noted. Pedal hair growth absent.  Neurologic Normal speech. Oriented to person, place, and time. Protective sensation absent  Dermatologic Wound left 4th toe epithelialized Wound left medial 2nd toe with central eschar, no warmth erythema signs of infection.  Orthopedic:  Pain palpation of the left fourth toe Hallux valgus left   Assessment:   1. Pain in left  foot   2. Ulcer of toe, left, limited to breakdown of skin [facility])    Plan:  Patient was evaluated and treated and all questions answered.  Ulcer left fourth toe, does appear healed. Left 2nd toe ulcer. Left foot worsening ischemia -She must f/u with vascular given worsening ischemic changes and high risk for amputation. -Medihoney applied to the 2nd toe ulceration. Offloaded with toe spacer -Continue surgical shoe.  No follow-ups on file.

## 2020-01-23 DIAGNOSIS — D509 Iron deficiency anemia, unspecified: Secondary | ICD-10-CM | POA: Diagnosis not present

## 2020-01-23 DIAGNOSIS — Z992 Dependence on renal dialysis: Secondary | ICD-10-CM | POA: Diagnosis not present

## 2020-01-23 DIAGNOSIS — E875 Hyperkalemia: Secondary | ICD-10-CM | POA: Diagnosis not present

## 2020-01-23 DIAGNOSIS — D631 Anemia in chronic kidney disease: Secondary | ICD-10-CM | POA: Diagnosis not present

## 2020-01-23 DIAGNOSIS — N186 End stage renal disease: Secondary | ICD-10-CM | POA: Diagnosis not present

## 2020-01-23 DIAGNOSIS — N2581 Secondary hyperparathyroidism of renal origin: Secondary | ICD-10-CM | POA: Diagnosis not present

## 2020-01-24 ENCOUNTER — Ambulatory Visit
Admission: EM | Admit: 2020-01-24 | Discharge: 2020-01-24 | Disposition: A | Discharge status: Home / Self Care | Payer: Medicare Other

## 2020-01-24 ENCOUNTER — Ambulatory Visit (INDEPENDENT_AMBULATORY_CARE_PROVIDER_SITE_OTHER): Payer: PRIVATE HEALTH INSURANCE | Admitting: Internal Medicine

## 2020-01-24 ENCOUNTER — Encounter: Payer: Self-pay | Admitting: Internal Medicine

## 2020-01-24 ENCOUNTER — Other Ambulatory Visit: Payer: Self-pay

## 2020-01-24 VITALS — BP 128/74 | HR 82 | Ht 65.0 in | Wt 157.0 lb

## 2020-01-24 DIAGNOSIS — K219 Gastro-esophageal reflux disease without esophagitis: Secondary | ICD-10-CM | POA: Diagnosis not present

## 2020-01-24 DIAGNOSIS — I110 Hypertensive heart disease with heart failure: Secondary | ICD-10-CM | POA: Diagnosis not present

## 2020-01-24 DIAGNOSIS — D649 Anemia, unspecified: Secondary | ICD-10-CM | POA: Diagnosis not present

## 2020-01-24 DIAGNOSIS — K5521 Angiodysplasia of colon with hemorrhage: Secondary | ICD-10-CM

## 2020-01-24 DIAGNOSIS — Z89611 Acquired absence of right leg above knee: Secondary | ICD-10-CM | POA: Diagnosis not present

## 2020-01-24 DIAGNOSIS — Z86010 Personal history of colonic polyps: Secondary | ICD-10-CM

## 2020-01-24 DIAGNOSIS — E1122 Type 2 diabetes mellitus with diabetic chronic kidney disease: Secondary | ICD-10-CM | POA: Diagnosis not present

## 2020-01-24 DIAGNOSIS — N186 End stage renal disease: Secondary | ICD-10-CM | POA: Diagnosis not present

## 2020-01-24 DIAGNOSIS — H6123 Impacted cerumen, bilateral: Secondary | ICD-10-CM | POA: Diagnosis not present

## 2020-01-24 DIAGNOSIS — Z89612 Acquired absence of left leg above knee: Secondary | ICD-10-CM

## 2020-01-24 DIAGNOSIS — F419 Anxiety disorder, unspecified: Secondary | ICD-10-CM | POA: Diagnosis not present

## 2020-01-24 NOTE — Patient Instructions (Signed)
We will put you in the system for a December 2021 colonoscopy recall.   I appreciate the opportunity to care for you. Silvano Rusk, MD, [facility]

## 2020-01-24 NOTE — ED Triage Notes (Signed)
Pt c/o bilateral ear fullness and can't hear out of them for almost 2wks. States rt ear feels worse.

## 2020-01-24 NOTE — ED Provider Notes (Signed)
[facility]    CSN: [id number] Arrival date & time: 01/24/20  1303      History   Chief Complaint Chief Complaint  Patient presents with   Ear Fullness    HPI LAURINE KUYPER is a 69 y.o. female.   69 year old female comes in for 2 week history of bilateral ear fullness and pressure. Denies pain. Has muffled hearing. Has occasional nasal congestion at baseline, no changes. Denies other URI symptoms. Denies fever.  ESRD on dialysis MWF, finished fall treatment yesterday.      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    Esophageal ulcer with bleeding    ESRD (end stage renal disease) ([place])    "suppose to start dialysis today" (06/15/2013)   Family history of adverse reaction to anesthesia    It took a while for pt sister to wake from anesthesia   GERD (gastroesophageal reflux disease)    Glaucoma    blind in both eyes   Heart murmur    Mild AS, moderate MR, moderate TR 10/30/16 echo   Hx of adenomatous colonic polyps 04/07/2018   Hypertension    Myalgia 12/31/2011   Neuropathy 12/31/2011   PAD (peripheral artery disease) ([place])    nonviable tissue left lower extremity   Shortness of breath    "when she doesn't go to dialysis"   Type II diabetes mellitus ([place])    Type II    Patient Active Problem List   Diagnosis Date Noted   Angiodysplasia of intestine with hemorrhage 01/24/2020   Acute blood loss anemia 12/16/2019   Hypotension 12/16/2019   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[place]) 11/17/2019   Infection of amputation stump, left lower extremity ([place]) 10/19/2019   Peripheral artery disease ([place]) 09/30/2019   Gangrene of foot  ([place]) 08/30/2019   Other fatigue 02/28/2019   Finger pain, left 02/03/2019   Trigger ring finger of left hand 02/03/2019   Osteomyelitis, unspecified ([place]) 11/16/2018   Diabetic retinopathy associated with type 2 diabetes mellitus ([place]) 11/15/2018   Rheumatoid factor positive 11/15/2018   Anemia in ESRD (end-stage renal disease) ([place]) 05/06/2018   Asthma 05/06/2018   Hx of adenomatous colonic polyps 04/07/2018   Status post carpal tunnel release 12/10/2017   Carpal tunnel syndrome, left upper limb 11/10/2017   Carpal tunnel syndrome, right upper limb 11/10/2017   Bilateral hand numbness 09/28/2017   Dependence on renal dialysis ([place]) 09/11/2017   Hypertension 07/06/2017   Chronic combined systolic and diastolic CHF (congestive heart failure) ([place]) 65/68/1275   Complicated migraine 17/00/1749   ESRD on dialysis ([place]) 06/04/2017   Blindness of both eyes 06/04/2017   Glaucoma 06/04/2017   Essential hypertension 10/27/2016   GERD (gastroesophageal reflux disease) 10/27/2016   Hypercalcemia 05/28/2015   Abnormal stress test 03/15/2015   Poor venous access 02/08/2015   Diarrhea, unspecified 11/02/2014   Fever, unspecified 11/02/2014   Pain, unspecified 11/02/2014   Pruritus, unspecified 11/02/2014   Shortness of breath 07/28/2014   Acquired absence of eye 03/31/2014   Coagulation defect, unspecified ([place]) 03/22/2014   Dysuria 02/14/2014   Encounter for  immunization 01/03/2014   Iron deficiency anemia, unspecified 10/18/2013   Unspecified protein-calorie malnutrition ([place]) 44/31/5400   Complication of vascular dialysis catheter 06/21/2013   Fibromyalgia 06/21/2013   Personal history of breast cancer 06/21/2013   Personal history of other diseases of the musculoskeletal system and connective tissue 06/21/2013   Type 2 diabetes mellitus with diabetic polyneuropathy ([place]) 06/21/2013   Secondary hyperparathyroidism of renal origin ([place])  06/20/2013   Unspecified complication of cardiac and vascular prosthetic device, implant and graft, subsequent encounter 06/20/2013   Type II diabetes mellitus with renal manifestations ([place]) 06/15/2013   Neuropathy 12/31/2011   Breast cancer of upper-inner quadrant of left female breast ([place]) 06/27/2011    Past Surgical History:  Procedure Laterality Date   A/V FISTULAGRAM N/A 09/21/2018   Procedure: A/V FISTULAGRAM - Right Upper;  Surgeon: [name], MD;  Location: [place] [facility];  Service: Cardiovascular;  Laterality: N/A;   ABDOMINAL AORTOGRAM N/A 11/30/2018   Procedure: ABDOMINAL AORTOGRAM;  Surgeon: [name], MD;  Location: [place] [facility];  Service: Cardiovascular;  Laterality: N/A;   ABDOMINAL AORTOGRAM W/LOWER EXTREMITY Left 07/29/2019   Procedure: ABDOMINAL AORTOGRAM W/LOWER EXTREMITY;  Surgeon: [name], MD;  Location: [place] [facility];  Service: Cardiovascular;  Laterality: Left;   ABDOMINAL HYSTERECTOMY     partial   AMPUTATION Left 08/30/2019   Procedure: AMPUTATION BELOW KNEE LEFT;  Surgeon: [name], MD;  Location: [place];  Service: Vascular;  Laterality: Left;   AMPUTATION Left 10/21/2019   Procedure: Amputation Above Knee;  Surgeon: Rosetta Posner, MD;  Location: [place];  Service: Vascular;  Laterality: Left;   AMPUTATION Right 11/17/2019   Procedure: AMPUTATION ABOVE KNEE, right leg;  Surgeon: Rosetta Posner, MD;  Location: [place];  Service: Vascular;  Laterality: Right;   AV FISTULA PLACEMENT Right 06/06/2013   Procedure: ARTERIOVENOUS (AV) FISTULA CREATION-RIGHT BRACHIAL CEPHALIC;  Surgeon: Conrad [place]ce [place], MD;  Location: [place];  Service: Vascular;  Laterality: Right;   BREAST BIOPSY Left    BREAST LUMPECTOMY Left    "and took out some lymph nodes" (06/15/2013)   CARDIAC CATHETERIZATION     04/02/15 [facility]): no angiographic CAD, LVEF 40% with global hypokinesis (LHC done for + stress echo, EF 45% 11/28/14)   CARPAL TUNNEL  RELEASE Left 11/26/2017   Procedure: LEFT CARPAL TUNNEL RELEASE;  Surgeon: [name], MD;  Location: [place];  Service: Orthopedics;  Laterality: Left;   CATARACT EXTRACTION W/ ANTERIOR VITRECTOMY Bilateral    CESAREAN SECTION  1980   COLONOSCOPY     ENTEROSCOPY N/A 12/18/2019   Procedure: ENTEROSCOPY;  Surgeon: Rush Landmark Telford Nab., MD;  Location: [place];  Service: Gastroenterology;  Laterality: N/A;   ESOPHAGOGASTRODUODENOSCOPY N/A 07/08/2017   Procedure: ESOPHAGOGASTRODUODENOSCOPY (EGD);  Surgeon: [name], MD;  [facility];  Service: Endoscopy;  Laterality: N/A;   ESOPHAGOGASTRODUODENOSCOPY (EGD) WITH PROPOFOL N/A 02/07/2018   Procedure: ESOPHAGOGASTRODUODENOSCOPY (EGD) WITH PROPOFOL;  Surgeon: [name], MD;  Location: [facility] ENDOSCOPY;  Service: Gastroenterology;  Laterality: N/A;  APC and clips placed   EYE SURGERY Bilateral    laser surgery   GIVENS CAPSULE STUDY N/A 12/19/2019   Procedure: GIVENS CAPSULE STUDY;  Surgeon: Irving Copas., MD;  Location: [place];  Service: Gastroenterology;  Laterality: N/A;   HOT HEMOSTASIS N/A 12/18/2019   Procedure: HOT HEMOSTASIS (ARGON PLASMA COAGULATION/BICAP);  Surgeon: Irving Copas., MD;  Location: [place];  Service: Gastroenterology;  Laterality: N/A;  LOWER EXTREMITY ANGIOGRAPHY Bilateral 11/30/2018   Procedure: LOWER EXTREMITY ANGIOGRAPHY;  Surgeon: [name], MD;  Location: [place] [facility];  Service: Cardiovascular;  Laterality: Bilateral;   PARS PLANA VITRECTOMY Right 05/05/2017   Procedure: PARS PLANA VITRECTOMY WITH 25 GAUGE; PARTIAL REMOVAL OF OIL; INFERIOR PERIPHERAL IRIDECTOMY, REFORM ANTERIOR CHAMBER RIGHT EYE;  Surgeon: [name], MD;  Location: [place];  Service: Ophthalmology;  Laterality: Right;   PERIPHERAL VASCULAR BALLOON ANGIOPLASTY Right 09/21/2018   Procedure: PERIPHERAL VASCULAR BALLOON ANGIOPLASTY;  Surgeon: [name], MD;  Location:  [place] [facility];  Service: Cardiovascular;  Laterality: Right;  AV fistula   PERIPHERAL VASCULAR BALLOON ANGIOPLASTY Left 11/30/2018   Procedure: PERIPHERAL VASCULAR BALLOON ANGIOPLASTY;  Surgeon: [name], MD;  Location: [place] [facility];  Service: Cardiovascular;  Laterality: Left;  Left Anterior Tibial Artery   REFRACTIVE SURGERY Bilateral    REMOVAL OF A DIALYSIS CATHETER Right 06/06/2013   Procedure: REMOVAL OF RIGHT MEDIPORT;  Surgeon: Conrad Tyler, MD;  Location: [place];  Service: Vascular;  Laterality: Right;   SUBMUCOSAL TATTOO INJECTION  12/18/2019   Procedure: SUBMUCOSAL TATTOO INJECTION;  Surgeon: Irving Copas., MD;  Location: [place];  Service: Gastroenterology;;   TONSILLECTOMY     UPPER GASTROINTESTINAL ENDOSCOPY     WOUND DEBRIDEMENT Left 09/30/2019   Procedure: IRRIGATION AND DEBRIDEMENT WOUND OF LEFT BELOW KNEE AMPUTATION;  Surgeon: [name], MD;  Location: [place];  Service: Vascular;  Laterality: Left;    OB History   No obstetric history on file.      Home Medications    Prior to Admission medications   Medication Sig Start Date End Date Taking? Authorizing Provider  albuterol (PROVENTIL) (2.5 MG/3ML) 0.083% nebulizer solution Take 3 mLs (2.5 mg total) by nebulization every 6 (six) hours as needed for wheezing or shortness of breath. 10/30/16   Theodis Blaze, MD  albuterol (VENTOLIN HFA) 108 (90 Base) MCG/ACT inhaler Inhale 2 puffs into the lungs every 6 (six) hours as needed for wheezing or shortness of breath.     [provider]  Alcohol Swabs (B-D SINGLE USE SWABS REGULAR) PADS in the morning, at noon, and at bedtime.  03/08/19   [provider]  aspirin EC 81 MG tablet Take 81 mg by mouth daily.    [provider]  atropine 1 % ophthalmic solution Place 1 drop into the right eye 2 (two) times daily. 07/04/17   [provider]  BD PEN NEEDLE NANO 2ND GEN 32G X 4 MM MISC in the morning, at  noon, and at bedtime.  03/03/19   [provider]  carvedilol (COREG) 25 MG tablet Take 0.5 tablets (12.5 mg total) by mouth 2 (two) times daily. 10/24/19 12/16/19  Alma Friendly, MD  carvedilol (COREG) 6.25 MG tablet Take 1 tablet (6.25 mg total) by mouth 2 (two) times daily. 12/20/19 01/19/20  [place]ford Shi, MD  darbepoetin (ARANESP) 200 MCG/0.4ML SOLN injection Inject 0.4 mLs (200 mcg total) into the vein every Wednesday with hemodialysis. 06/22/13   Geradine Girt, DO  diclofenac sodium (VOLTAREN) 1 % GEL Apply 2 g topically 4 (four) times daily as needed (pain).  04/28/19   [provider]  diphenhydrAMINE (BENADRYL) 25 MG tablet Take 25 mg by mouth in the morning, at noon, and at bedtime.     [provider]  docusate sodium (COLACE) 100 MG capsule Take 100 mg by mouth 2 (two) times daily  with a meal.     [provider]  doxercalciferol (HECTOROL) 4 MCG/2ML injection Inject 0.5 mLs (1 mcg total) into the vein every Monday, Wednesday, and Friday with hemodialysis. 06/18/13   Geradine Girt, DO  DULoxetine (CYMBALTA) 30 MG capsule Take 30 mg by mouth daily.    [provider]  fentaNYL (DURAGESIC) 12 MCG/HR 1 patch every 3 (three) days. 01/04/20   [provider]  fluticasone (FLONASE) 50 MCG/ACT nasal spray Place 2 sprays into both nostrils daily as needed for allergies.  02/07/19   [provider]  gabapentin (NEURONTIN) 100 MG capsule Take 100 mg by mouth 2 (two) times daily.    [provider]  insulin aspart (NOVOLOG FLEXPEN) 100 UNIT/ML FlexPen Inject 2-10 Units into the skin 2 (two) times daily with a meal.     [provider]  lanthanum (FOSRENOL) 1000 MG chewable tablet Chew 2,000-4,000 mg by mouth See admin instructions. Chew 4,000 mg by mouth with each meal and 2,000 mg with each snack    [provider]  multivitamin (RENA-VIT) TABS tablet Take 1 tablet by mouth daily. 12/20/19   [provider]  omeprazole (PRILOSEC) 40 MG capsule Take 1 capsule (40 mg total) by mouth daily. Patient taking differently: Take 40 mg by mouth 2 (two) times daily.  02/09/18   Cherene Altes, MD  [facility] VERIO test strip 1 each by Other route 3 (three) times daily.  12/15/17   [provider]  prednisoLONE acetate (PRED FORTE) 1 % ophthalmic suspension Place 1 drop into the right eye 2 (two) times daily.  04/27/17   [provider]  SENNA LAXATIVE 8.6 MG tablet SMARTSIG:1-2 Tablet(s) By Mouth PRN 01/04/20   [provider]  traMADol (ULTRAM) 50 MG tablet Take 1 tablet (50 mg total) by mouth every 6 (six) hours as needed. Patient taking differently: Take 25 mg by mouth every 6 (six) hours as needed for moderate pain.  12/12/19   Dagoberto Ligas, PA-C    Family History Family History  Problem Relation Age of Onset   Diabetes Mother    Hyperlipidemia Mother    Hypertension Mother    Hypertension Father    Diabetes Sister    Diabetes Brother    Hypertension Brother    Heart attack Brother    Kidney disease Brother    Colon cancer Neg Hx    Colon polyps Neg Hx    Esophageal cancer Neg Hx    Gallbladder disease Neg Hx    Rectal cancer Neg Hx    Stomach cancer Neg Hx     Social History Social History   Tobacco Use   Smoking status: Never Smoker   Smokeless tobacco: Never Used  Scientific laboratory technician Use: Never used  Substance Use Topics   Alcohol use: No   Drug use: No     Allergies   Tape, Latex, Oxycodone, Tramadol, Morphine and related, and Vicodin [hydrocodone-acetaminophen]   Review of Systems Review of Systems  Reason unable to perform ROS: See HPI as above.     Physical Exam Triage Vital Signs ED Triage Vitals [01/24/20 1330]  Enc Vitals Group     BP 117/68     Pulse Rate 80     Resp 18     Temp 98.9 F (37.2 C)     Temp Source Oral     SpO2 (!) 88 %     Weight      Height  Head Circumference       Peak Flow      Pain Score 0     Pain Loc      Pain Edu?      Excl. in [place]?    No data found.  Updated Vital Signs BP 117/68 (BP Location: Left Arm)    Pulse 80    Temp 98.9 F (37.2 C) (Oral)    Resp 18    LMP  (LMP Unknown)    SpO2 (!) 88%   Physical Exam Constitutional:      General: She is not in acute distress.    Appearance: She is well-developed. She is not toxic-appearing or diaphoretic.  HENT:     Head: Normocephalic and atraumatic.     Ears:     Comments: No tragal tenderness. Bilateral cerumen impaction, TM not visible. Pulmonary:     Effort: Pulmonary effort is normal. No respiratory distress.     Comments: Speaking in full sentences without difficulty Musculoskeletal:     Cervical back: Normal range of motion and neck supple.  Skin:    General: Skin is warm and dry.  Neurological:     Mental Status: She is alert and oriented to person, place, and time.      UC Treatments / Results  Labs (all labs ordered are listed, but only abnormal results are displayed) Labs Reviewed - No data to display  EKG   Radiology No results found.  Procedures Procedures (including critical care time)  Medications Ordered in UC Medications - No data to display  Initial Impression / Assessment and Plan / UC Course  I have reviewed the triage vital signs and the nursing notes.  Pertinent labs & imaging results that were available during my care of the patient were reviewed by me and considered in my medical decision making (see chart for details).    Patient O2 88% on RA. Denies shob. States normal 85%-89%.   Patient tolerated ear irrigation well. TM visible without otitis media. Symptoms resolved. Discussed using flonase/nasacort if continues with symptoms. Return precautions given.   Final Clinical Impressions(s) / UC Diagnoses   Final diagnoses:  Bilateral impacted cerumen   ED Prescriptions    None     PDMP not reviewed this encounter.   Ok Edwards,  PA-C 01/24/20 1455

## 2020-01-24 NOTE — Discharge Instructions (Signed)
Ear wax removed today. If continue to have ear fullness, continue flonase/nasacort for possible eustachian tube dysfunction causing symptoms. Follow up with PCP for reevaluation if symptoms not improving, having pain.

## 2020-01-24 NOTE — Progress Notes (Signed)
SHERITA DECOSTE 69 y.o. 1951/03/12 [id number]  Assessment & Plan:   Encounter Diagnoses  Name Primary?  . Angiodysplasia of intestine with hemorrhage Yes  . Hx of adenomatous colonic polyps   . 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HCC)    I think we most likely found the source of her GI bleed and anemia with the AVMs. This was in the setting of Plavix use which has been discontinued  She does deserve consideration for a surveillance colonoscopy but her comorbidities in particular her bilateral amputations make it very difficult to prep. We talked about that today and since she is being considered for prostheses and wants to follow through with that at this time I think in the situation she is in, it is medically reasonable to defer colonoscopy for the time being and we will plan on a recall for December of this year to see where she is with respect to prostheses and ambulation etc. As a bilateral amputee she might need an overnight admission to the hospital for preparation for colonoscopy. With bilateral prostheses that might be avoidable. We will regroup in a few months. She is also blind.  Continue management of anemia through nephrology.  CC: Prince Solian, MD Dr. Donato Heinz   Subjective:   Chief Complaint: Follow-up of anemia and AVMs  HPI Ms. Gelene Mink is here for follow-up after being hospitalized with a hemoglobin of 5 and dark heme positive stools, while on Plavix. Work-up revealed the following:  5/30/2021SBE - jejunal AVM ablated 12/19/2019 - capsule endoscopy - one tiny small bowel AVM  She was transfused and is back at dialysis and getting EPO and I think parenteral iron support. No further signs of dark stools per her sister who is her primary caretaker and here with her today. Plavix has been stopped.  Prior Colonoscopy September 2019 with 13 adenomas, overdue for her 1 year recall.  This spring she ended up with 2 above-the-knee amputations  initially a left BKA converted to AKA and then a right AKA as well. She is awaiting potential prostheses. She is wheelchair-bound. Allergies  Allergen Reactions  . Tape Itching and Rash    62M Transpore adhesive tape. Medical tape pulls off the skin!! PAPER TAPE ONLY, PLEASE  . Latex Hives  . Oxycodone Other (See Comments)    Hallucinations   . Tramadol Other (See Comments)    Hallucinations with a full tablet  . Morphine And Related Other (See Comments)    Hallucinations   . Vicodin [Hydrocodone-Acetaminophen] Other (See Comments)    Hallucinations    Current Meds  Medication Sig  . albuterol (PROVENTIL) (2.5 MG/62ML) 0.083% nebulizer solution Take 3 mLs (2.5 mg total) by nebulization every 6 (six) hours as needed for wheezing or shortness of breath.  Marland Kitchen albuterol (VENTOLIN HFA) 108 (90 Base) MCG/ACT inhaler Inhale 2 puffs into the lungs every 6 (six) hours as needed for wheezing or shortness of breath.   . Alcohol Swabs (B-D SINGLE USE SWABS REGULAR) PADS in the morning, at noon, and at bedtime.   Marland Kitchen aspirin EC 81 MG tablet Take 81 mg by mouth daily.  Marland Kitchen atropine 1 % ophthalmic solution Place 1 drop into the right eye 2 (two) times daily.  . BD PEN NEEDLE NANO 2ND GEN 32G X 4 MM MISC in the morning, at noon, and at bedtime.   . darbepoetin (ARANESP) 200 MCG/0.4ML SOLN injection Inject 0.4 mLs (200 mcg total) into the vein every Wednesday with hemodialysis.  Marland Kitchen  diclofenac sodium (VOLTAREN) 1 % GEL Apply 2 g topically 4 (four) times daily as needed (pain).   Marland Kitchen diphenhydrAMINE (BENADRYL) 25 MG tablet Take 25 mg by mouth in the morning, at noon, and at bedtime.   . docusate sodium (COLACE) 100 MG capsule Take 100 mg by mouth 2 (two) times daily with a meal.   . doxercalciferol (HECTOROL) 4 MCG/2ML injection Inject 0.5 mLs (1 mcg total) into the vein every Monday, Wednesday, and Friday with hemodialysis.  . DULoxetine (CYMBALTA) 30 MG capsule Take 30 mg by mouth daily.  . fentaNYL (DURAGESIC)  12 MCG/HR 1 patch every 3 (three) days.  . fluticasone (FLONASE) 50 MCG/ACT nasal spray Place 2 sprays into both nostrils daily as needed for allergies.   Marland Kitchen gabapentin (NEURONTIN) 100 MG capsule Take 100 mg by mouth 2 (two) times daily.  . insulin aspart (NOVOLOG FLEXPEN) 100 UNIT/ML FlexPen Inject 2-10 Units into the skin 2 (two) times daily with a meal.   . lanthanum (FOSRENOL) 1000 MG chewable tablet Chew 2,000-4,000 mg by mouth See admin instructions. Chew 4,000 mg by mouth with each meal and 2,000 mg with each snack  . multivitamin (RENA-VIT) TABS tablet Take 1 tablet by mouth daily.  Marland Kitchen omeprazole (PRILOSEC) 40 MG capsule Take 1 capsule (40 mg total) by mouth daily. (Patient taking differently: Take 40 mg by mouth 2 (two) times daily. )  . ONETOUCH VERIO test strip 1 each by Other route 3 (three) times daily.   . prednisoLONE acetate (PRED FORTE) 1 % ophthalmic suspension Place 1 drop into the right eye 2 (two) times daily.   . SENNA LAXATIVE 8.6 MG tablet SMARTSIG:1-2 Tablet(s) By Mouth PRN  . traMADol (ULTRAM) 50 MG tablet Take 1 tablet (50 mg total) by mouth every 6 (six) hours as needed. (Patient taking differently: Take 25 mg by mouth every 6 (six) hours as needed for moderate pain. )   Past Medical History:  Diagnosis Date  . Anemia   . Anxiety   . Arthritis    "joints" (06/15/2013)  . Asthma   . 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 Blood transfusion without reported diagnosis   . Breast cancer ([place])    left  . CHF (congestive heart failure) ([place])   . 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 Esophageal ulcer with bleeding   . ESRD (end stage renal disease) ([place])    "suppose to start dialysis today" (06/15/2013)  . Family history of adverse reaction to anesthesia    It took a while for pt sister to wake from anesthesia  . GERD (gastroesophageal reflux disease)   . Glaucoma    blind in both eyes  . Heart murmur    Mild AS, moderate MR, moderate TR  10/30/16 echo  . Hx of adenomatous colonic polyps 04/07/2018  . Hypertension   . Myalgia 12/31/2011  . Neuropathy 12/31/2011  . PAD (peripheral artery disease) (HCC)    nonviable tissue left lower extremity  . Shortness of breath    "when she doesn't go to dialysis"  . Type II diabetes mellitus ([place])    Type II   Past Surgical History:  Procedure Laterality Date  . A/V FISTULAGRAM N/A 09/21/2018   Procedure: A/V FISTULAGRAM - Right Upper;  Surgeon: [name], MD;  Location: [place] [facility];  Service: Cardiovascular;  Laterality: N/A;  . ABDOMINAL AORTOGRAM N/A 11/30/2018   Procedure: ABDOMINAL AORTOGRAM;  Surgeon: Ruta Hinds  E, MD;  Location: [place] [facility];  Service: Cardiovascular;  Laterality: N/A;  . ABDOMINAL AORTOGRAM W/LOWER EXTREMITY Left 07/29/2019   Procedure: ABDOMINAL AORTOGRAM W/LOWER EXTREMITY;  Surgeon: [name], MD;  Location: [place] [facility];  Service: Cardiovascular;  Laterality: Left;  . ABDOMINAL HYSTERECTOMY     partial  . AMPUTATION Left 08/30/2019   Procedure: AMPUTATION BELOW KNEE LEFT;  Surgeon: [name], MD;  Location: [facility];  Service: Vascular;  Laterality: Left;  . AMPUTATION Left 10/21/2019   Procedure: Amputation Above Knee;  Surgeon: Rosetta Posner, MD;  Location: [place];  Service: Vascular;  Laterality: Left;  . AMPUTATION Right 11/17/2019   Procedure: AMPUTATION ABOVE KNEE, right leg;  Surgeon: Rosetta Posner, MD;  Location: [place];  Service: Vascular;  Laterality: Right;  . AV FISTULA PLACEMENT Right 06/06/2013   Procedure: ARTERIOVENOUS (AV) FISTULA CREATION-RIGHT BRACHIAL CEPHALIC;  Surgeon: Conrad Lajas, MD;  Location: [place];  Service: Vascular;  Laterality: Right;  . BREAST BIOPSY Left   . BREAST LUMPECTOMY Left    "and took out some lymph nodes" (06/15/2013)  . CARDIAC CATHETERIZATION     04/02/15 [facility]): no angiographic CAD, LVEF 40% with global hypokinesis (LHC done for + stress echo, EF 45% 11/28/14)  . CARPAL TUNNEL  RELEASE Left 11/26/2017   Procedure: LEFT CARPAL TUNNEL RELEASE;  Surgeon: [name], MD;  Location: [place];  Service: Orthopedics;  Laterality: Left;  . CATARACT EXTRACTION W/ ANTERIOR VITRECTOMY Bilateral   . CESAREAN SECTION  1980  . COLONOSCOPY    . ENTEROSCOPY N/A 12/18/2019   Procedure: ENTEROSCOPY;  Surgeon: Mansouraty, Telford Nab., MD;  Location: [place];  Service: Gastroenterology;  Laterality: N/A;  . ESOPHAGOGASTRODUODENOSCOPY N/A 07/08/2017   Procedure: ESOPHAGOGASTRODUODENOSCOPY (EGD);  Surgeon: [name], MD;  Location: [facility];  Service: Endoscopy;  Laterality: N/A;  . ESOPHAGOGASTRODUODENOSCOPY (EGD) WITH PROPOFOL N/A 02/07/2018   Procedure: ESOPHAGOGASTRODUODENOSCOPY (EGD) WITH PROPOFOL;  Surgeon: [name], MD;  Location: [facility] ENDOSCOPY;  Service: Gastroenterology;  Laterality: N/A;  APC and clips placed  . EYE SURGERY Bilateral    laser surgery  . GIVENS CAPSULE STUDY N/A 12/19/2019   Procedure: GIVENS CAPSULE STUDY;  Surgeon: Irving Copas., MD;  Location: [place];  Service: Gastroenterology;  Laterality: N/A;  . HOT HEMOSTASIS N/A 12/18/2019   Procedure: HOT HEMOSTASIS (ARGON PLASMA COAGULATION/BICAP);  Surgeon: Irving Copas., MD;  Location: [place];  Service: Gastroenterology;  Laterality: N/A;  . LOWER EXTREMITY ANGIOGRAPHY Bilateral 11/30/2018   Procedure: LOWER EXTREMITY ANGIOGRAPHY;  Surgeon: [name], MD;  Location: [place] [facility];  Service: Cardiovascular;  Laterality: Bilateral;  . PARS PLANA VITRECTOMY Right 05/05/2017   Procedure: PARS PLANA VITRECTOMY WITH 25 GAUGE; PARTIAL REMOVAL OF OIL; INFERIOR PERIPHERAL IRIDECTOMY, REFORM ANTERIOR CHAMBER RIGHT EYE;  Surgeon: [name], MD;  Location: [place];  Service: Ophthalmology;  Laterality: Right;  . PERIPHERAL VASCULAR BALLOON ANGIOPLASTY Right 09/21/2018   Procedure: PERIPHERAL VASCULAR BALLOON ANGIOPLASTY;  Surgeon: [name], MD;  Location:  [place] [facility];  Service: Cardiovascular;  Laterality: Right;  AV fistula  . PERIPHERAL VASCULAR BALLOON ANGIOPLASTY Left 11/30/2018   Procedure: PERIPHERAL VASCULAR BALLOON ANGIOPLASTY;  Surgeon: [name], MD;  Location: [place] [facility];  Service: Cardiovascular;  Laterality: Left;  Left Anterior Tibial Artery  . REFRACTIVE SURGERY Bilateral   . REMOVAL OF A DIALYSIS CATHETER Right 06/06/2013   Procedure: REMOVAL OF RIGHT MEDIPORT;  Surgeon: Conrad Kirkland, MD;  Location: [place];  Service: Vascular;  Laterality: Right;  . SUBMUCOSAL TATTOO INJECTION  12/18/2019   Procedure: SUBMUCOSAL TATTOO INJECTION;  Surgeon: Irving Copas., MD;  Location: [place];  Service: Gastroenterology;;  . TONSILLECTOMY    . UPPER GASTROINTESTINAL ENDOSCOPY    . WOUND DEBRIDEMENT Left 09/30/2019   Procedure: IRRIGATION AND DEBRIDEMENT WOUND OF LEFT BELOW KNEE AMPUTATION;  Surgeon: [name], MD;  Location: [facility] OR;  Service: Vascular;  Laterality: Left;   Social History   Social History Narrative   She reports she is single with one son and one daughter. 3 caffeinated beverages a day. She dialyzes Monday Wednesday Friday at the [place].      Her sister is her primary caretaker and spends the nights with her   family history includes Diabetes in her brother, mother, and sister; Heart attack in her brother; Hyperlipidemia in her mother; Hypertension in her brother, father, and mother; Kidney disease in her brother.   Review of Systems As per HPI  Objective:   Physical Exam BP 128/74   Pulse 82   Ht 5\' 5"  (1.651 m)   Wt 157 lb (71.2 kg)   LMP  (LMP Unknown)   BMI 26.13 kg/m  Well-developed well-nourished obese black woman no acute distress Sitting in a wheelchair status post bilateral AKA

## 2020-01-25 DIAGNOSIS — D631 Anemia in chronic kidney disease: Secondary | ICD-10-CM | POA: Diagnosis not present

## 2020-01-25 DIAGNOSIS — E1142 Type 2 diabetes mellitus with diabetic polyneuropathy: Secondary | ICD-10-CM | POA: Diagnosis not present

## 2020-01-25 DIAGNOSIS — D509 Iron deficiency anemia, unspecified: Secondary | ICD-10-CM | POA: Diagnosis not present

## 2020-01-25 DIAGNOSIS — Z992 Dependence on renal dialysis: Secondary | ICD-10-CM | POA: Diagnosis not present

## 2020-01-25 DIAGNOSIS — N2581 Secondary hyperparathyroidism of renal origin: Secondary | ICD-10-CM | POA: Diagnosis not present

## 2020-01-25 DIAGNOSIS — N186 End stage renal disease: Secondary | ICD-10-CM | POA: Diagnosis not present

## 2020-01-25 DIAGNOSIS — E875 Hyperkalemia: Secondary | ICD-10-CM | POA: Diagnosis not present

## 2020-01-26 ENCOUNTER — Other Ambulatory Visit: Payer: Self-pay | Admitting: *Deleted

## 2020-01-26 DIAGNOSIS — I110 Hypertensive heart disease with heart failure: Secondary | ICD-10-CM | POA: Diagnosis not present

## 2020-01-26 DIAGNOSIS — F419 Anxiety disorder, unspecified: Secondary | ICD-10-CM | POA: Diagnosis not present

## 2020-01-26 DIAGNOSIS — D649 Anemia, unspecified: Secondary | ICD-10-CM | POA: Diagnosis not present

## 2020-01-26 DIAGNOSIS — K219 Gastro-esophageal reflux disease without esophagitis: Secondary | ICD-10-CM | POA: Diagnosis not present

## 2020-01-26 DIAGNOSIS — E1122 Type 2 diabetes mellitus with diabetic chronic kidney disease: Secondary | ICD-10-CM | POA: Diagnosis not present

## 2020-01-26 DIAGNOSIS — N186 End stage renal disease: Secondary | ICD-10-CM | POA: Diagnosis not present

## 2020-01-26 NOTE — Patient Outreach (Signed)
[facility]) Care Management  01/26/2020  [name] 24-Feb-1951 [id number]   Telephone Assessment-Diabetes  RN spoke with pt and her spoke today and received an update on pt's ongoing management of care. Pt state she had labs yesterday at the dialysis center and pending additional labs end of July with a pending follow up appointment with Dr.Avva in August. Reports her recent guclose level this morning was 138 and holding around that reading with no residual effects of hypo-hyperglycemia.  Sister [name]) states pt is doing well with no acute issues since the last conversation with this RN. Currently pendign [facility] services to be set up however pt continues to have transportation with family members from her siblings. Plan of care review with some goals met related to pt able to mention food items to avoid and taking all her prescribed medications with no acute issues.   Plan of care updated with interventions as RN will continue to follow up monthly on pt's ongoing progress. No additional inquires or request at this time.  [facility] CM Care Plan Problem One     Most Recent Value  Care Plan Problem One Deficient knowledge related to  diabetes management  Role Documenting the Problem One Care Management Telephonic Coordinator  Care Plan for Problem One Active  [facility] Long Term Goal  Pt/caregiver will verbalize Hgb A1c reduction within the next 90 days.  [facility] Long Term Goal Start Date 11/24/19  Interventions for Problem One Long Term Goal Will extend this goal to allow adherence withthe pending result via upcoming labs.  [facility] CM Short Term Goal #1  Pt/caregiver will identify two foods to avoid in a diabetic diet within the next 30 day.  [facility] CM Short Term Goal #1 Start Date 11/24/19  [facility] CM Short Term Goal #1 Met Date 01/26/20  [facility] CM Short Term Goal #2  Caregiver will monitor blood glucose and document all readings in the [facility] journal/calendar within the next 30 days for her providers  to view.  [facility] CM Short Term Goal #2 Start Date 11/24/19  [facility] CM Short Term Goal #2 Met Date 01/26/20      [name], RN Care Management Coordinator [place] Office [phone]

## 2020-01-27 DIAGNOSIS — N2581 Secondary hyperparathyroidism of renal origin: Secondary | ICD-10-CM | POA: Diagnosis not present

## 2020-01-27 DIAGNOSIS — N186 End stage renal disease: Secondary | ICD-10-CM | POA: Diagnosis not present

## 2020-01-27 DIAGNOSIS — D631 Anemia in chronic kidney disease: Secondary | ICD-10-CM | POA: Diagnosis not present

## 2020-01-27 DIAGNOSIS — Z992 Dependence on renal dialysis: Secondary | ICD-10-CM | POA: Diagnosis not present

## 2020-01-27 DIAGNOSIS — D509 Iron deficiency anemia, unspecified: Secondary | ICD-10-CM | POA: Diagnosis not present

## 2020-01-27 DIAGNOSIS — E875 Hyperkalemia: Secondary | ICD-10-CM | POA: Diagnosis not present

## 2020-01-30 DIAGNOSIS — D631 Anemia in chronic kidney disease: Secondary | ICD-10-CM | POA: Diagnosis not present

## 2020-01-30 DIAGNOSIS — D509 Iron deficiency anemia, unspecified: Secondary | ICD-10-CM | POA: Diagnosis not present

## 2020-01-30 DIAGNOSIS — N186 End stage renal disease: Secondary | ICD-10-CM | POA: Diagnosis not present

## 2020-01-30 DIAGNOSIS — N2581 Secondary hyperparathyroidism of renal origin: Secondary | ICD-10-CM | POA: Diagnosis not present

## 2020-01-30 DIAGNOSIS — Z992 Dependence on renal dialysis: Secondary | ICD-10-CM | POA: Diagnosis not present

## 2020-01-30 DIAGNOSIS — E875 Hyperkalemia: Secondary | ICD-10-CM | POA: Diagnosis not present

## 2020-01-31 ENCOUNTER — Ambulatory Visit (INDEPENDENT_AMBULATORY_CARE_PROVIDER_SITE_OTHER): Payer: PRIVATE HEALTH INSURANCE | Admitting: Physical Therapy

## 2020-01-31 ENCOUNTER — Encounter: Payer: Self-pay | Admitting: Physical Therapy

## 2020-01-31 ENCOUNTER — Encounter: Payer: Medicare Other | Admitting: Physical Therapy

## 2020-01-31 ENCOUNTER — Other Ambulatory Visit: Payer: Self-pay

## 2020-01-31 DIAGNOSIS — M25651 Stiffness of right hip, not elsewhere classified: Secondary | ICD-10-CM

## 2020-01-31 DIAGNOSIS — M6281 Muscle weakness (generalized): Secondary | ICD-10-CM | POA: Diagnosis not present

## 2020-01-31 DIAGNOSIS — M25652 Stiffness of left hip, not elsewhere classified: Secondary | ICD-10-CM

## 2020-01-31 DIAGNOSIS — R2689 Other abnormalities of gait and mobility: Secondary | ICD-10-CM

## 2020-01-31 NOTE — Therapy (Signed)
[facility] Physical Therapy [address] [place], [place], [address] Phone: [phone]   Fax:  [phone]  Physical Therapy Evaluation  Patient Details  Name: [name] MRN: [id number] Date of Birth: 1950/12/25 Referring Provider (PT): [name], MD   Encounter Date: 01/31/2020   PT End of Session - 01/31/20 1405    Visit Number 1    Number of Visits 1    Authorization Type Medicare & BCBS    PT Start Time 0488    PT Stop Time 1052    PT Time Calculation (min) 37 min    Equipment Utilized During Treatment Gait belt    Activity Tolerance Patient tolerated treatment well    Behavior During Therapy [facility] for tasks assessed/performed           Past Medical History:  Diagnosis Date  . Anemia   . Anxiety   . Arthritis    "joints" (06/15/2013)  . Asthma   . 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 Blood transfusion without reported diagnosis   . Breast cancer ([place])    left  . CHF (congestive heart failure) ([place])   . 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 Esophageal ulcer with bleeding   . ESRD (end stage renal disease) ([place])    "suppose to start dialysis today" (06/15/2013)  . Family history of adverse reaction to anesthesia    It took a while for pt sister to wake from anesthesia  . GERD (gastroesophageal reflux disease)   . Glaucoma    blind in both eyes  . Heart murmur    Mild AS, moderate MR, moderate TR 10/30/16 echo  . Hx of adenomatous colonic polyps 04/07/2018  . Hypertension   . Myalgia 12/31/2011  . Neuropathy 12/31/2011  . PAD (peripheral artery disease) (HCC)    nonviable tissue left lower extremity  . Shortness of breath    "when she doesn't go to dialysis"  . Type II diabetes mellitus ([place])    Type II    Past Surgical History:  Procedure Laterality Date  . A/V FISTULAGRAM N/A 09/21/2018   Procedure: A/V FISTULAGRAM - Right Upper;  Surgeon: [name], MD;  Location: [place] CV LAB;   Service: Cardiovascular;  Laterality: N/A;  . ABDOMINAL AORTOGRAM N/A 11/30/2018   Procedure: ABDOMINAL AORTOGRAM;  Surgeon: [name], MD;  Location: [place] CV LAB;  Service: Cardiovascular;  Laterality: N/A;  . ABDOMINAL AORTOGRAM W/LOWER EXTREMITY Left 07/29/2019   Procedure: ABDOMINAL AORTOGRAM W/LOWER EXTREMITY;  Surgeon: [name], MD;  Location: [place] CV LAB;  Service: Cardiovascular;  Laterality: Left;  . ABDOMINAL HYSTERECTOMY     partial  . AMPUTATION Left 08/30/2019   Procedure: AMPUTATION BELOW KNEE LEFT;  Surgeon: [name], MD;  Location: [facility] OR;  Service: Vascular;  Laterality: Left;  . AMPUTATION Left 10/21/2019   Procedure: Amputation Above Knee;  Surgeon: Rosetta Posner, MD;  Location: [place];  Service: Vascular;  Laterality: Left;  . AMPUTATION Right 11/17/2019   Procedure: AMPUTATION ABOVE KNEE, right leg;  Surgeon: Rosetta Posner, MD;  Location: [place];  Service: Vascular;  Laterality: Right;  . AV FISTULA PLACEMENT Right 06/06/2013   Procedure: ARTERIOVENOUS (AV) FISTULA CREATION-RIGHT BRACHIAL CEPHALIC;  Surgeon: Conrad Hahira, MD;  Location: [place];  Service: Vascular;  Laterality: Right;  . BREAST BIOPSY Left   . BREAST LUMPECTOMY Left    "and took out some  lymph nodes" (06/15/2013)  . CARDIAC CATHETERIZATION     04/02/15 [facility]): no angiographic CAD, LVEF 40% with global hypokinesis (LHC done for + stress echo, EF 45% 11/28/14)  . CARPAL TUNNEL RELEASE Left 11/26/2017   Procedure: LEFT CARPAL TUNNEL RELEASE;  Surgeon: [name], MD;  Location: [place];  Service: Orthopedics;  Laterality: Left;  . CATARACT EXTRACTION W/ ANTERIOR VITRECTOMY Bilateral   . CESAREAN SECTION  1980  . COLONOSCOPY    . ENTEROSCOPY N/A 12/18/2019   Procedure: ENTEROSCOPY;  Surgeon: Mansouraty, Telford Nab., MD;  Location: [place];  Service: Gastroenterology;  Laterality: N/A;  . ESOPHAGOGASTRODUODENOSCOPY N/A 07/08/2017   Procedure:  ESOPHAGOGASTRODUODENOSCOPY (EGD);  Surgeon: [name], MD;  Location: [facility] ENDOSCOPY;  Service: Endoscopy;  Laterality: N/A;  . ESOPHAGOGASTRODUODENOSCOPY (EGD) WITH PROPOFOL N/A 02/07/2018   Procedure: ESOPHAGOGASTRODUODENOSCOPY (EGD) WITH PROPOFOL;  Surgeon: [name], MD;  Location: [facility] ENDOSCOPY;  Service: Gastroenterology;  Laterality: N/A;  APC and clips placed  . EYE SURGERY Bilateral    laser surgery  . GIVENS CAPSULE STUDY N/A 12/19/2019   Procedure: GIVENS CAPSULE STUDY;  Surgeon: Irving Copas., MD;  Location: [place];  Service: Gastroenterology;  Laterality: N/A;  . HOT HEMOSTASIS N/A 12/18/2019   Procedure: HOT HEMOSTASIS (ARGON PLASMA COAGULATION/BICAP);  Surgeon: Irving Copas., MD;  Location: [place];  Service: Gastroenterology;  Laterality: N/A;  . LOWER EXTREMITY ANGIOGRAPHY Bilateral 11/30/2018   Procedure: LOWER EXTREMITY ANGIOGRAPHY;  Surgeon: [name], MD;  Location: [place] CV LAB;  Service: Cardiovascular;  Laterality: Bilateral;  . PARS PLANA VITRECTOMY Right 05/05/2017   Procedure: PARS PLANA VITRECTOMY WITH 25 GAUGE; PARTIAL REMOVAL OF OIL; INFERIOR PERIPHERAL IRIDECTOMY, REFORM ANTERIOR CHAMBER RIGHT EYE;  Surgeon: [name], MD;  Location: [place];  Service: Ophthalmology;  Laterality: Right;  . PERIPHERAL VASCULAR BALLOON ANGIOPLASTY Right 09/21/2018   Procedure: PERIPHERAL VASCULAR BALLOON ANGIOPLASTY;  Surgeon: [name], MD;  Location: [place] CV LAB;  Service: Cardiovascular;  Laterality: Right;  AV fistula  . PERIPHERAL VASCULAR BALLOON ANGIOPLASTY Left 11/30/2018   Procedure: PERIPHERAL VASCULAR BALLOON ANGIOPLASTY;  Surgeon: [name], MD;  Location: [place] CV LAB;  Service: Cardiovascular;  Laterality: Left;  Left Anterior Tibial Artery  . REFRACTIVE SURGERY Bilateral   . REMOVAL OF A DIALYSIS CATHETER Right 06/06/2013   Procedure: REMOVAL OF RIGHT MEDIPORT;  Surgeon: Conrad [place], MD;   Location: [place];  Service: Vascular;  Laterality: Right;  . SUBMUCOSAL TATTOO INJECTION  12/18/2019   Procedure: SUBMUCOSAL TATTOO INJECTION;  Surgeon: Irving Copas., MD;  Location: [place];  Service: Gastroenterology;;  . TONSILLECTOMY    . UPPER GASTROINTESTINAL ENDOSCOPY    . WOUND DEBRIDEMENT Left 09/30/2019   Procedure: IRRIGATION AND DEBRIDEMENT WOUND OF LEFT BELOW KNEE AMPUTATION;  Surgeon: [name], MD;  Location: [place];  Service: Vascular;  Laterality: Left;    There were no vitals filed for this visit.    Subjective Assessment - 01/31/20 1019    Subjective This 69yo female was referred to PT for prsothetic evaluation on 01/12/2020 by [name], MD.  She underwent a right Transfemoral Amputation on 11/17/2019 and left Transfemoral Amputation 10/21/2019.    Patient is accompained by: Family member   sister, Jobie Quaker   Pertinent History Bil. TFAs, left carpal tunnel release surgery 11/26/2017, anxiety, arthritis, blind from glaucoma, breast cancer, CHF, ESRD, heart murmur, HTN, PAD, DM2    Patient Stated Goals wants prostheses to  walk again    Currently in Pain? No/denies              [facility] PT Assessment - 01/31/20 1018      Assessment   Medical Diagnosis Bilateral Transfemoral Amputations    Referring Provider (PT) [name], MD    Onset Date/Surgical Date 01/12/20   MD referral to PT   Hand Dominance Right    Prior Therapy HHPT thru June      Precautions   Precautions Fall    Precaution Comments No BP in RUE, completely blind      Balance Screen   Has the patient fallen in the past 6 months No    Has the patient had a decrease in activity level because of a fear of falling?  Yes    Is the patient reluctant to leave their home because of a fear of falling?  Yes      [place] Private residence    Living Arrangements Alone;Children;Other relatives   was alone but sister, son & dtr provide 24hr care    Available Help at Discharge Family;Available 24 hours/day    Type of [place] One level    Home Equipment Wheelchair - manual;Hospital bed;Tub bench;Bedside commode      Prior Function   Level of Independence Independent with household mobility with device;Independent with community mobility with device   prior to amputations used RW for couple years.      Posture/Postural Control   Posture/Postural Control Postural limitations    Postural Limitations Rounded Shoulders;Forward head;Posterior pelvic tilt      ROM / Strength   AROM / PROM / Strength PROM;Strength      PROM   Overall PROM  Deficits    PROM Assessment Site Hip    Right Hip Extension -5   supine Thomas position   Left Hip Extension -4   supine Thomas position     Strength   Overall Strength Deficits    Right Hip Flexion 4-/5    Right Hip Extension 2+/5    Right Hip ABduction 3-/5    Left Hip Flexion 4-/5    Left Hip Extension 2+/5    Left Hip ABduction 3-/5      Bed Mobility   Bed Mobility Rolling Right;Rolling Left;Supine to Sit;Sit to Supine    Rolling Right Independent with assistive device   simulated bed rail   Rolling Left Independent with assistive device   simulated bed rail   Supine to Sit Contact Guard/Touching assist   simulated bed rail   Sit to Supine Independent      Transfers   Transfers Anterior-Posterior Transfer    Anterior-Posterior Transfer 5: Supervision;To level surface      Ambulation/Gait   Ambulation/Gait No      Balance   Balance Assessed Yes      Static Sitting Balance   Static Sitting - Balance Support No upper extremity supported    Static Sitting - Level of Assistance 5: Stand by assistance    Static Sitting - Comment/# of Minutes 2      Dynamic Sitting Balance   Dynamic Sitting - Balance Support Left upper extremity supported    Dynamic Sitting - Level of Assistance 5: Stand by assistance    Reach (Patient is able to  reach ___ inches to right, left, forward, back) 5    Dynamic Sitting - Balance  Activities Reaching for objects;Trunk control activities           Prosthetics Assessment - 01/31/20 1018      Prosthetics   Edema pitting     Residual limb condition  dry skin, cylinderical shape, small dry scabs on incision.                      Objective measurements completed on examination: See above findings.               PT Education - 01/31/20 1050    Education Details anterior & posterior transfers to toilet & sitting posteriorly on toilet if needs to toilet when out of her home    Person(s) Educated Patient;Caregiver(s)    Methods Explanation;Demonstration;Verbal cues    Comprehension Verbalized understanding                       Plan - 01/31/20 2257    Clinical Impression Statement This patient underwent bilateral Transfemoral Amputations in April 2020.  Patient is blind with no vision even light. Unfortunately with this level of amputation of both lower extremities & no vision she is not a candidate for prostheses even Foreshortened Prostheses.    Stability/Clinical Decision Making Stable/Uncomplicated    Clinical Decision Making Moderate    Rehab Potential Good    PT Frequency One time visit    PT Treatment/Interventions ADLs/Self Care Home Management    PT Next Visit Plan Evaluation only.    Consulted and Agree with Plan of Care Patient;Family member/caregiver    Family Member Consulted Sister, Jobie Quaker           Patient will benefit from skilled therapeutic intervention in order to improve the following deficits and impairments:  Decreased activity tolerance, Decreased balance, Decreased range of motion, Increased edema, Postural dysfunction, Decreased strength  Visit Diagnosis: Muscle weakness (generalized)  Other abnormalities of gait and mobility  Stiffness of left hip, not elsewhere classified  Stiffness of right hip, not elsewhere  classified     Problem List Patient Active Problem List   Diagnosis Date Noted  . Angiodysplasia of intestine with hemorrhage 01/24/2020  . Acute blood loss anemia 12/16/2019  . Hypotension 12/16/2019  . 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[place]) 11/17/2019  . Infection of amputation stump, left lower extremity ([place]) 10/19/2019  . Peripheral artery disease ([place]) 09/30/2019  . Gangrene of foot ([place]) 08/30/2019  . Other fatigue 02/28/2019  . Finger pain, left 02/03/2019  . Trigger ring finger of left hand 02/03/2019  . Osteomyelitis, unspecified ([place]) 11/16/2018  . Diabetic retinopathy associated with type 2 diabetes mellitus ([place]) 11/15/2018  . Rheumatoid factor positive 11/15/2018  . Anemia in ESRD (end-stage renal disease) ([place]) 05/06/2018  . Asthma 05/06/2018  . Hx of adenomatous colonic polyps 04/07/2018  . Status post carpal tunnel release 12/10/2017  . Carpal tunnel syndrome, left upper limb 11/10/2017  . Carpal tunnel syndrome, right upper limb 11/10/2017  . Bilateral hand numbness 09/28/2017  . Dependence on renal dialysis ([place]) 09/11/2017  . Hypertension 07/06/2017  . Chronic combined systolic and diastolic CHF (congestive heart failure) ([place]) 07/06/2017  . Complicated migraine 65/46/5035  . ESRD on dialysis ([place]) 06/04/2017  . Blindness of both eyes 06/04/2017  . Glaucoma 06/04/2017  . Essential hypertension 10/27/2016  . GERD (gastroesophageal reflux disease) 10/27/2016  . Hypercalcemia 05/28/2015  . Abnormal stress test 03/15/2015  . Poor venous access 02/08/2015  . Diarrhea, unspecified 11/02/2014  .  Fever, unspecified 11/02/2014  . Pain, unspecified 11/02/2014  . Pruritus, unspecified 11/02/2014  . Shortness of breath 07/28/2014  . Acquired absence of eye 03/31/2014  . Coagulation defect, unspecified ([place]) 03/22/2014  . Dysuria 02/14/2014  . Encounter for immunization 01/03/2014  . Iron deficiency anemia, unspecified 10/18/2013  . Unspecified  protein-calorie malnutrition ([place]) 09/23/2013  . Complication of vascular dialysis catheter 06/21/2013  . Fibromyalgia 06/21/2013  . Personal history of breast cancer 06/21/2013  . Personal history of other diseases of the musculoskeletal system and connective tissue 06/21/2013  . Type 2 diabetes mellitus with diabetic polyneuropathy ([place]) 06/21/2013  . Secondary hyperparathyroidism of renal origin ([place]) 06/20/2013  . Unspecified complication of cardiac and vascular prosthetic device, implant and graft, subsequent encounter 06/20/2013  . Type II diabetes mellitus with renal manifestations ([place]) 06/15/2013  . Neuropathy 12/31/2011  . Breast cancer of upper-inner quadrant of left female breast ([place]) 06/27/2011    Jamey Reas PT, DPT 01/31/2020, 11:03 PM  [facility] Physical Therapy [address] [place], [place], [address] Phone: [phone]   Fax:  [phone]  Name: [name] MRN: [id number] Date of Birth: [birth year]

## 2020-02-01 DIAGNOSIS — D649 Anemia, unspecified: Secondary | ICD-10-CM | POA: Diagnosis not present

## 2020-02-01 DIAGNOSIS — E1122 Type 2 diabetes mellitus with diabetic chronic kidney disease: Secondary | ICD-10-CM | POA: Diagnosis not present

## 2020-02-01 DIAGNOSIS — N2581 Secondary hyperparathyroidism of renal origin: Secondary | ICD-10-CM | POA: Diagnosis not present

## 2020-02-01 DIAGNOSIS — N186 End stage renal disease: Secondary | ICD-10-CM | POA: Diagnosis not present

## 2020-02-01 DIAGNOSIS — D631 Anemia in chronic kidney disease: Secondary | ICD-10-CM | POA: Diagnosis not present

## 2020-02-01 DIAGNOSIS — Z992 Dependence on renal dialysis: Secondary | ICD-10-CM | POA: Diagnosis not present

## 2020-02-01 DIAGNOSIS — E875 Hyperkalemia: Secondary | ICD-10-CM | POA: Diagnosis not present

## 2020-02-01 DIAGNOSIS — D509 Iron deficiency anemia, unspecified: Secondary | ICD-10-CM | POA: Diagnosis not present

## 2020-02-01 DIAGNOSIS — F419 Anxiety disorder, unspecified: Secondary | ICD-10-CM | POA: Diagnosis not present

## 2020-02-01 DIAGNOSIS — K219 Gastro-esophageal reflux disease without esophagitis: Secondary | ICD-10-CM | POA: Diagnosis not present

## 2020-02-01 DIAGNOSIS — I110 Hypertensive heart disease with heart failure: Secondary | ICD-10-CM | POA: Diagnosis not present

## 2020-02-02 DIAGNOSIS — I110 Hypertensive heart disease with heart failure: Secondary | ICD-10-CM | POA: Diagnosis not present

## 2020-02-02 DIAGNOSIS — K219 Gastro-esophageal reflux disease without esophagitis: Secondary | ICD-10-CM | POA: Diagnosis not present

## 2020-02-02 DIAGNOSIS — D649 Anemia, unspecified: Secondary | ICD-10-CM | POA: Diagnosis not present

## 2020-02-02 DIAGNOSIS — E1122 Type 2 diabetes mellitus with diabetic chronic kidney disease: Secondary | ICD-10-CM | POA: Diagnosis not present

## 2020-02-02 DIAGNOSIS — F419 Anxiety disorder, unspecified: Secondary | ICD-10-CM | POA: Diagnosis not present

## 2020-02-02 DIAGNOSIS — N186 End stage renal disease: Secondary | ICD-10-CM | POA: Diagnosis not present

## 2020-02-03 DIAGNOSIS — Z992 Dependence on renal dialysis: Secondary | ICD-10-CM | POA: Diagnosis not present

## 2020-02-03 DIAGNOSIS — D631 Anemia in chronic kidney disease: Secondary | ICD-10-CM | POA: Diagnosis not present

## 2020-02-03 DIAGNOSIS — N2581 Secondary hyperparathyroidism of renal origin: Secondary | ICD-10-CM | POA: Diagnosis not present

## 2020-02-03 DIAGNOSIS — N186 End stage renal disease: Secondary | ICD-10-CM | POA: Diagnosis not present

## 2020-02-03 DIAGNOSIS — D509 Iron deficiency anemia, unspecified: Secondary | ICD-10-CM | POA: Diagnosis not present

## 2020-02-03 DIAGNOSIS — E875 Hyperkalemia: Secondary | ICD-10-CM | POA: Diagnosis not present

## 2020-02-06 DIAGNOSIS — Z992 Dependence on renal dialysis: Secondary | ICD-10-CM | POA: Diagnosis not present

## 2020-02-06 DIAGNOSIS — D631 Anemia in chronic kidney disease: Secondary | ICD-10-CM | POA: Diagnosis not present

## 2020-02-06 DIAGNOSIS — N186 End stage renal disease: Secondary | ICD-10-CM | POA: Diagnosis not present

## 2020-02-06 DIAGNOSIS — N2581 Secondary hyperparathyroidism of renal origin: Secondary | ICD-10-CM | POA: Diagnosis not present

## 2020-02-06 DIAGNOSIS — E875 Hyperkalemia: Secondary | ICD-10-CM | POA: Diagnosis not present

## 2020-02-06 DIAGNOSIS — D509 Iron deficiency anemia, unspecified: Secondary | ICD-10-CM | POA: Diagnosis not present

## 2020-02-07 ENCOUNTER — Encounter: Payer: Medicare Other | Admitting: Physical Therapy

## 2020-02-08 DIAGNOSIS — D631 Anemia in chronic kidney disease: Secondary | ICD-10-CM | POA: Diagnosis not present

## 2020-02-08 DIAGNOSIS — E875 Hyperkalemia: Secondary | ICD-10-CM | POA: Diagnosis not present

## 2020-02-08 DIAGNOSIS — D509 Iron deficiency anemia, unspecified: Secondary | ICD-10-CM | POA: Diagnosis not present

## 2020-02-08 DIAGNOSIS — Z992 Dependence on renal dialysis: Secondary | ICD-10-CM | POA: Diagnosis not present

## 2020-02-08 DIAGNOSIS — N2581 Secondary hyperparathyroidism of renal origin: Secondary | ICD-10-CM | POA: Diagnosis not present

## 2020-02-08 DIAGNOSIS — N186 End stage renal disease: Secondary | ICD-10-CM | POA: Diagnosis not present

## 2020-02-10 DIAGNOSIS — D509 Iron deficiency anemia, unspecified: Secondary | ICD-10-CM | POA: Diagnosis not present

## 2020-02-10 DIAGNOSIS — E875 Hyperkalemia: Secondary | ICD-10-CM | POA: Diagnosis not present

## 2020-02-10 DIAGNOSIS — Z992 Dependence on renal dialysis: Secondary | ICD-10-CM | POA: Diagnosis not present

## 2020-02-10 DIAGNOSIS — N2581 Secondary hyperparathyroidism of renal origin: Secondary | ICD-10-CM | POA: Diagnosis not present

## 2020-02-10 DIAGNOSIS — N186 End stage renal disease: Secondary | ICD-10-CM | POA: Diagnosis not present

## 2020-02-10 DIAGNOSIS — D631 Anemia in chronic kidney disease: Secondary | ICD-10-CM | POA: Diagnosis not present

## 2020-02-13 DIAGNOSIS — D631 Anemia in chronic kidney disease: Secondary | ICD-10-CM | POA: Diagnosis not present

## 2020-02-13 DIAGNOSIS — N186 End stage renal disease: Secondary | ICD-10-CM | POA: Diagnosis not present

## 2020-02-13 DIAGNOSIS — Z992 Dependence on renal dialysis: Secondary | ICD-10-CM | POA: Diagnosis not present

## 2020-02-13 DIAGNOSIS — D509 Iron deficiency anemia, unspecified: Secondary | ICD-10-CM | POA: Diagnosis not present

## 2020-02-13 DIAGNOSIS — N2581 Secondary hyperparathyroidism of renal origin: Secondary | ICD-10-CM | POA: Diagnosis not present

## 2020-02-13 DIAGNOSIS — E875 Hyperkalemia: Secondary | ICD-10-CM | POA: Diagnosis not present

## 2020-02-14 DIAGNOSIS — F419 Anxiety disorder, unspecified: Secondary | ICD-10-CM | POA: Diagnosis not present

## 2020-02-14 DIAGNOSIS — N186 End stage renal disease: Secondary | ICD-10-CM | POA: Diagnosis not present

## 2020-02-14 DIAGNOSIS — K219 Gastro-esophageal reflux disease without esophagitis: Secondary | ICD-10-CM | POA: Diagnosis not present

## 2020-02-14 DIAGNOSIS — D649 Anemia, unspecified: Secondary | ICD-10-CM | POA: Diagnosis not present

## 2020-02-14 DIAGNOSIS — E1122 Type 2 diabetes mellitus with diabetic chronic kidney disease: Secondary | ICD-10-CM | POA: Diagnosis not present

## 2020-02-14 DIAGNOSIS — I110 Hypertensive heart disease with heart failure: Secondary | ICD-10-CM | POA: Diagnosis not present

## 2020-02-15 DIAGNOSIS — Z992 Dependence on renal dialysis: Secondary | ICD-10-CM | POA: Diagnosis not present

## 2020-02-15 DIAGNOSIS — D631 Anemia in chronic kidney disease: Secondary | ICD-10-CM | POA: Diagnosis not present

## 2020-02-15 DIAGNOSIS — E875 Hyperkalemia: Secondary | ICD-10-CM | POA: Diagnosis not present

## 2020-02-15 DIAGNOSIS — N186 End stage renal disease: Secondary | ICD-10-CM | POA: Diagnosis not present

## 2020-02-15 DIAGNOSIS — N2581 Secondary hyperparathyroidism of renal origin: Secondary | ICD-10-CM | POA: Diagnosis not present

## 2020-02-15 DIAGNOSIS — D509 Iron deficiency anemia, unspecified: Secondary | ICD-10-CM | POA: Diagnosis not present

## 2020-02-16 DIAGNOSIS — E1129 Type 2 diabetes mellitus with other diabetic kidney complication: Secondary | ICD-10-CM | POA: Diagnosis not present

## 2020-02-16 DIAGNOSIS — E7849 Other hyperlipidemia: Secondary | ICD-10-CM | POA: Diagnosis not present

## 2020-02-17 DIAGNOSIS — Z992 Dependence on renal dialysis: Secondary | ICD-10-CM | POA: Diagnosis not present

## 2020-02-17 DIAGNOSIS — D631 Anemia in chronic kidney disease: Secondary | ICD-10-CM | POA: Diagnosis not present

## 2020-02-17 DIAGNOSIS — D509 Iron deficiency anemia, unspecified: Secondary | ICD-10-CM | POA: Diagnosis not present

## 2020-02-17 DIAGNOSIS — N2581 Secondary hyperparathyroidism of renal origin: Secondary | ICD-10-CM | POA: Diagnosis not present

## 2020-02-17 DIAGNOSIS — E875 Hyperkalemia: Secondary | ICD-10-CM | POA: Diagnosis not present

## 2020-02-17 DIAGNOSIS — N186 End stage renal disease: Secondary | ICD-10-CM | POA: Diagnosis not present

## 2020-02-19 DIAGNOSIS — D649 Anemia, unspecified: Secondary | ICD-10-CM | POA: Diagnosis not present

## 2020-02-19 DIAGNOSIS — E1129 Type 2 diabetes mellitus with other diabetic kidney complication: Secondary | ICD-10-CM | POA: Diagnosis not present

## 2020-02-19 DIAGNOSIS — Z853 Personal history of malignant neoplasm of breast: Secondary | ICD-10-CM | POA: Diagnosis not present

## 2020-02-19 DIAGNOSIS — Z515 Encounter for palliative care: Secondary | ICD-10-CM | POA: Diagnosis not present

## 2020-02-19 DIAGNOSIS — Z89511 Acquired absence of right leg below knee: Secondary | ICD-10-CM | POA: Diagnosis not present

## 2020-02-19 DIAGNOSIS — K219 Gastro-esophageal reflux disease without esophagitis: Secondary | ICD-10-CM | POA: Diagnosis not present

## 2020-02-19 DIAGNOSIS — Z89512 Acquired absence of left leg below knee: Secondary | ICD-10-CM | POA: Diagnosis not present

## 2020-02-19 DIAGNOSIS — Z992 Dependence on renal dialysis: Secondary | ICD-10-CM | POA: Diagnosis not present

## 2020-02-19 DIAGNOSIS — I110 Hypertensive heart disease with heart failure: Secondary | ICD-10-CM | POA: Diagnosis not present

## 2020-02-19 DIAGNOSIS — F419 Anxiety disorder, unspecified: Secondary | ICD-10-CM | POA: Diagnosis not present

## 2020-02-19 DIAGNOSIS — N186 End stage renal disease: Secondary | ICD-10-CM | POA: Diagnosis not present

## 2020-02-19 DIAGNOSIS — E1122 Type 2 diabetes mellitus with diabetic chronic kidney disease: Secondary | ICD-10-CM | POA: Diagnosis not present

## 2020-02-20 DIAGNOSIS — D509 Iron deficiency anemia, unspecified: Secondary | ICD-10-CM | POA: Diagnosis not present

## 2020-02-20 DIAGNOSIS — N186 End stage renal disease: Secondary | ICD-10-CM | POA: Diagnosis not present

## 2020-02-20 DIAGNOSIS — Z992 Dependence on renal dialysis: Secondary | ICD-10-CM | POA: Diagnosis not present

## 2020-02-20 DIAGNOSIS — D631 Anemia in chronic kidney disease: Secondary | ICD-10-CM | POA: Diagnosis not present

## 2020-02-20 DIAGNOSIS — N2581 Secondary hyperparathyroidism of renal origin: Secondary | ICD-10-CM | POA: Diagnosis not present

## 2020-02-21 DIAGNOSIS — F329 Major depressive disorder, single episode, unspecified: Secondary | ICD-10-CM | POA: Diagnosis not present

## 2020-02-21 DIAGNOSIS — I110 Hypertensive heart disease with heart failure: Secondary | ICD-10-CM | POA: Diagnosis not present

## 2020-02-21 DIAGNOSIS — H540X55 Blindness right eye category 5, blindness left eye category 5: Secondary | ICD-10-CM | POA: Diagnosis not present

## 2020-02-21 DIAGNOSIS — Z89612 Acquired absence of left leg above knee: Secondary | ICD-10-CM | POA: Diagnosis not present

## 2020-02-21 DIAGNOSIS — Z0000 Encounter for general adult medical examination without abnormal findings: Secondary | ICD-10-CM | POA: Diagnosis not present

## 2020-02-21 DIAGNOSIS — R82998 Other abnormal findings in urine: Secondary | ICD-10-CM | POA: Diagnosis not present

## 2020-02-21 DIAGNOSIS — C50912 Malignant neoplasm of unspecified site of left female breast: Secondary | ICD-10-CM | POA: Diagnosis not present

## 2020-02-21 DIAGNOSIS — K922 Gastrointestinal hemorrhage, unspecified: Secondary | ICD-10-CM | POA: Diagnosis not present

## 2020-02-21 DIAGNOSIS — E1129 Type 2 diabetes mellitus with other diabetic kidney complication: Secondary | ICD-10-CM | POA: Diagnosis not present

## 2020-02-21 DIAGNOSIS — G8929 Other chronic pain: Secondary | ICD-10-CM | POA: Diagnosis not present

## 2020-02-21 DIAGNOSIS — Z89611 Acquired absence of right leg above knee: Secondary | ICD-10-CM | POA: Diagnosis not present

## 2020-02-21 DIAGNOSIS — N186 End stage renal disease: Secondary | ICD-10-CM | POA: Diagnosis not present

## 2020-02-21 DIAGNOSIS — I509 Heart failure, unspecified: Secondary | ICD-10-CM | POA: Diagnosis not present

## 2020-02-21 DIAGNOSIS — I739 Peripheral vascular disease, unspecified: Secondary | ICD-10-CM | POA: Diagnosis not present

## 2020-02-21 DIAGNOSIS — E1122 Type 2 diabetes mellitus with diabetic chronic kidney disease: Secondary | ICD-10-CM | POA: Diagnosis not present

## 2020-02-21 DIAGNOSIS — K219 Gastro-esophageal reflux disease without esophagitis: Secondary | ICD-10-CM | POA: Diagnosis not present

## 2020-02-21 DIAGNOSIS — F419 Anxiety disorder, unspecified: Secondary | ICD-10-CM | POA: Diagnosis not present

## 2020-02-21 DIAGNOSIS — D649 Anemia, unspecified: Secondary | ICD-10-CM | POA: Diagnosis not present

## 2020-02-22 DIAGNOSIS — Z992 Dependence on renal dialysis: Secondary | ICD-10-CM | POA: Diagnosis not present

## 2020-02-22 DIAGNOSIS — D509 Iron deficiency anemia, unspecified: Secondary | ICD-10-CM | POA: Diagnosis not present

## 2020-02-22 DIAGNOSIS — D631 Anemia in chronic kidney disease: Secondary | ICD-10-CM | POA: Diagnosis not present

## 2020-02-22 DIAGNOSIS — N186 End stage renal disease: Secondary | ICD-10-CM | POA: Diagnosis not present

## 2020-02-22 DIAGNOSIS — N2581 Secondary hyperparathyroidism of renal origin: Secondary | ICD-10-CM | POA: Diagnosis not present

## 2020-02-23 DIAGNOSIS — N760 Acute vaginitis: Secondary | ICD-10-CM | POA: Diagnosis not present

## 2020-02-23 DIAGNOSIS — N898 Other specified noninflammatory disorders of vagina: Secondary | ICD-10-CM | POA: Diagnosis not present

## 2020-02-23 DIAGNOSIS — Z90710 Acquired absence of both cervix and uterus: Secondary | ICD-10-CM | POA: Diagnosis not present

## 2020-02-23 DIAGNOSIS — Z1272 Encounter for screening for malignant neoplasm of vagina: Secondary | ICD-10-CM | POA: Diagnosis not present

## 2020-02-23 DIAGNOSIS — Z1212 Encounter for screening for malignant neoplasm of rectum: Secondary | ICD-10-CM | POA: Diagnosis not present

## 2020-02-23 DIAGNOSIS — Z124 Encounter for screening for malignant neoplasm of cervix: Secondary | ICD-10-CM | POA: Diagnosis not present

## 2020-02-24 DIAGNOSIS — N186 End stage renal disease: Secondary | ICD-10-CM | POA: Diagnosis not present

## 2020-02-24 DIAGNOSIS — D631 Anemia in chronic kidney disease: Secondary | ICD-10-CM | POA: Diagnosis not present

## 2020-02-24 DIAGNOSIS — Z992 Dependence on renal dialysis: Secondary | ICD-10-CM | POA: Diagnosis not present

## 2020-02-24 DIAGNOSIS — D509 Iron deficiency anemia, unspecified: Secondary | ICD-10-CM | POA: Diagnosis not present

## 2020-02-24 DIAGNOSIS — N2581 Secondary hyperparathyroidism of renal origin: Secondary | ICD-10-CM | POA: Diagnosis not present

## 2020-02-27 DIAGNOSIS — N2581 Secondary hyperparathyroidism of renal origin: Secondary | ICD-10-CM | POA: Diagnosis not present

## 2020-02-27 DIAGNOSIS — D631 Anemia in chronic kidney disease: Secondary | ICD-10-CM | POA: Diagnosis not present

## 2020-02-27 DIAGNOSIS — D509 Iron deficiency anemia, unspecified: Secondary | ICD-10-CM | POA: Diagnosis not present

## 2020-02-27 DIAGNOSIS — N186 End stage renal disease: Secondary | ICD-10-CM | POA: Diagnosis not present

## 2020-02-27 DIAGNOSIS — Z992 Dependence on renal dialysis: Secondary | ICD-10-CM | POA: Diagnosis not present

## 2020-02-28 ENCOUNTER — Other Ambulatory Visit: Payer: Self-pay | Admitting: *Deleted

## 2020-02-28 DIAGNOSIS — I110 Hypertensive heart disease with heart failure: Secondary | ICD-10-CM | POA: Diagnosis not present

## 2020-02-28 DIAGNOSIS — E1122 Type 2 diabetes mellitus with diabetic chronic kidney disease: Secondary | ICD-10-CM | POA: Diagnosis not present

## 2020-02-28 DIAGNOSIS — D649 Anemia, unspecified: Secondary | ICD-10-CM | POA: Diagnosis not present

## 2020-02-28 DIAGNOSIS — F419 Anxiety disorder, unspecified: Secondary | ICD-10-CM | POA: Diagnosis not present

## 2020-02-28 DIAGNOSIS — K219 Gastro-esophageal reflux disease without esophagitis: Secondary | ICD-10-CM | POA: Diagnosis not present

## 2020-02-28 DIAGNOSIS — N186 End stage renal disease: Secondary | ICD-10-CM | POA: Diagnosis not present

## 2020-02-28 NOTE — Patient Outreach (Signed)
[name]) Care Management  02/28/2020  [name] 07/22/1950 [id number]   Telephone Assessment-Diabetes  RN spoke with pt today and received an update. Pt states she continue to heal with her recent amputation and wears her sleeve. Pt states she was not able to obtain a prothesis due to been blind but may attempt with another source in the future. States she wears the sleeve that was provided with only some ocassional soreness. Continues dialysis M-W-F with no events or issues. Reports a recent A1c that was in the range of 5 along with daily glucose readings <150. States her sister [name] keeps track of her readings and not available at this time. Pt states all is well and she continues to maintain a low carbohydrate diet eliminating all potatoes. Plan of care discussed with all goals and interventions. Pt receptive and will continue to work on the goals.  Plan: Will continue to case manage this case and follow up next month.   Goals Addressed              This Visit's Progress   .  to continue to get better (pt-stated)        CARE PLAN ENTRY (see longtitudinal plan of care for additional care plan information)  Objective:  Lab Results  Component Value Date   HGBA1C 6.0 (H) 10/19/2019 .   Lab Results  Component Value Date   CREATININE 6.99 (H) 12/20/2019   CREATININE 4.15 (H) 12/17/2019   CREATININE 3.60 (H) 12/16/2019 .   Mar[place] Kitchen No results found for: EGFR  Current Barriers:  Mar[place] Kitchen Knowledge Deficits related to basic Diabetes pathophysiology and self care/management  Case Manager Clinical Goal(s):  Over the next 60 days, patient will demonstrate improved adherence to prescribed treatment plan for diabetes self care/management as evidenced by:  [place] Kitchen Verbalize daily monitoring and recording of CBG within 28 days . Verbalize adherence to ADA/ carb modified diet within the next 28 days  Interventions:  . Provided education to patient about basic DM disease  process . Discussed plans with patient for ongoing care management follow up and provided patient with direct contact information for care management team . Provided patient with written educational materials related to hypo and hyperglycemia and importance of correct treatment  Patient Self Care Activities:  . Self administers oral medications as prescribed . Attends all scheduled provider appointments . Checks blood sugars as prescribed and utilize hyper and hypoglycemia protocol as needed . Adheres to prescribed ADA/carb modified  Initial goal documentation        Cathy Mina, RN Care Management Coordinator [place] Office [phone]

## 2020-02-29 DIAGNOSIS — D509 Iron deficiency anemia, unspecified: Secondary | ICD-10-CM | POA: Diagnosis not present

## 2020-02-29 DIAGNOSIS — N2581 Secondary hyperparathyroidism of renal origin: Secondary | ICD-10-CM | POA: Diagnosis not present

## 2020-02-29 DIAGNOSIS — Z992 Dependence on renal dialysis: Secondary | ICD-10-CM | POA: Diagnosis not present

## 2020-02-29 DIAGNOSIS — D631 Anemia in chronic kidney disease: Secondary | ICD-10-CM | POA: Diagnosis not present

## 2020-02-29 DIAGNOSIS — N186 End stage renal disease: Secondary | ICD-10-CM | POA: Diagnosis not present

## 2020-03-01 DIAGNOSIS — F419 Anxiety disorder, unspecified: Secondary | ICD-10-CM | POA: Diagnosis not present

## 2020-03-01 DIAGNOSIS — K219 Gastro-esophageal reflux disease without esophagitis: Secondary | ICD-10-CM | POA: Diagnosis not present

## 2020-03-01 DIAGNOSIS — N186 End stage renal disease: Secondary | ICD-10-CM | POA: Diagnosis not present

## 2020-03-01 DIAGNOSIS — D649 Anemia, unspecified: Secondary | ICD-10-CM | POA: Diagnosis not present

## 2020-03-01 DIAGNOSIS — I110 Hypertensive heart disease with heart failure: Secondary | ICD-10-CM | POA: Diagnosis not present

## 2020-03-01 DIAGNOSIS — E1122 Type 2 diabetes mellitus with diabetic chronic kidney disease: Secondary | ICD-10-CM | POA: Diagnosis not present

## 2020-03-02 DIAGNOSIS — D509 Iron deficiency anemia, unspecified: Secondary | ICD-10-CM | POA: Diagnosis not present

## 2020-03-02 DIAGNOSIS — N2581 Secondary hyperparathyroidism of renal origin: Secondary | ICD-10-CM | POA: Diagnosis not present

## 2020-03-02 DIAGNOSIS — N186 End stage renal disease: Secondary | ICD-10-CM | POA: Diagnosis not present

## 2020-03-02 DIAGNOSIS — D631 Anemia in chronic kidney disease: Secondary | ICD-10-CM | POA: Diagnosis not present

## 2020-03-02 DIAGNOSIS — Z992 Dependence on renal dialysis: Secondary | ICD-10-CM | POA: Diagnosis not present

## 2020-03-05 DIAGNOSIS — Z992 Dependence on renal dialysis: Secondary | ICD-10-CM | POA: Diagnosis not present

## 2020-03-05 DIAGNOSIS — N186 End stage renal disease: Secondary | ICD-10-CM | POA: Diagnosis not present

## 2020-03-05 DIAGNOSIS — D509 Iron deficiency anemia, unspecified: Secondary | ICD-10-CM | POA: Diagnosis not present

## 2020-03-05 DIAGNOSIS — D631 Anemia in chronic kidney disease: Secondary | ICD-10-CM | POA: Diagnosis not present

## 2020-03-05 DIAGNOSIS — N2581 Secondary hyperparathyroidism of renal origin: Secondary | ICD-10-CM | POA: Diagnosis not present

## 2020-03-06 DIAGNOSIS — I110 Hypertensive heart disease with heart failure: Secondary | ICD-10-CM | POA: Diagnosis not present

## 2020-03-06 DIAGNOSIS — N186 End stage renal disease: Secondary | ICD-10-CM | POA: Diagnosis not present

## 2020-03-06 DIAGNOSIS — F419 Anxiety disorder, unspecified: Secondary | ICD-10-CM | POA: Diagnosis not present

## 2020-03-06 DIAGNOSIS — K219 Gastro-esophageal reflux disease without esophagitis: Secondary | ICD-10-CM | POA: Diagnosis not present

## 2020-03-06 DIAGNOSIS — E1122 Type 2 diabetes mellitus with diabetic chronic kidney disease: Secondary | ICD-10-CM | POA: Diagnosis not present

## 2020-03-06 DIAGNOSIS — D649 Anemia, unspecified: Secondary | ICD-10-CM | POA: Diagnosis not present

## 2020-03-07 DIAGNOSIS — N2581 Secondary hyperparathyroidism of renal origin: Secondary | ICD-10-CM | POA: Diagnosis not present

## 2020-03-07 DIAGNOSIS — N186 End stage renal disease: Secondary | ICD-10-CM | POA: Diagnosis not present

## 2020-03-07 DIAGNOSIS — D631 Anemia in chronic kidney disease: Secondary | ICD-10-CM | POA: Diagnosis not present

## 2020-03-07 DIAGNOSIS — D509 Iron deficiency anemia, unspecified: Secondary | ICD-10-CM | POA: Diagnosis not present

## 2020-03-07 DIAGNOSIS — Z992 Dependence on renal dialysis: Secondary | ICD-10-CM | POA: Diagnosis not present

## 2020-03-09 DIAGNOSIS — Z992 Dependence on renal dialysis: Secondary | ICD-10-CM | POA: Diagnosis not present

## 2020-03-09 DIAGNOSIS — N186 End stage renal disease: Secondary | ICD-10-CM | POA: Diagnosis not present

## 2020-03-09 DIAGNOSIS — N2581 Secondary hyperparathyroidism of renal origin: Secondary | ICD-10-CM | POA: Diagnosis not present

## 2020-03-09 DIAGNOSIS — D631 Anemia in chronic kidney disease: Secondary | ICD-10-CM | POA: Diagnosis not present

## 2020-03-09 DIAGNOSIS — D509 Iron deficiency anemia, unspecified: Secondary | ICD-10-CM | POA: Diagnosis not present

## 2020-03-12 DIAGNOSIS — N186 End stage renal disease: Secondary | ICD-10-CM | POA: Diagnosis not present

## 2020-03-12 DIAGNOSIS — D509 Iron deficiency anemia, unspecified: Secondary | ICD-10-CM | POA: Diagnosis not present

## 2020-03-12 DIAGNOSIS — N2581 Secondary hyperparathyroidism of renal origin: Secondary | ICD-10-CM | POA: Diagnosis not present

## 2020-03-12 DIAGNOSIS — Z992 Dependence on renal dialysis: Secondary | ICD-10-CM | POA: Diagnosis not present

## 2020-03-12 DIAGNOSIS — D631 Anemia in chronic kidney disease: Secondary | ICD-10-CM | POA: Diagnosis not present

## 2020-03-14 DIAGNOSIS — N186 End stage renal disease: Secondary | ICD-10-CM | POA: Diagnosis not present

## 2020-03-14 DIAGNOSIS — D509 Iron deficiency anemia, unspecified: Secondary | ICD-10-CM | POA: Diagnosis not present

## 2020-03-14 DIAGNOSIS — Z992 Dependence on renal dialysis: Secondary | ICD-10-CM | POA: Diagnosis not present

## 2020-03-14 DIAGNOSIS — D631 Anemia in chronic kidney disease: Secondary | ICD-10-CM | POA: Diagnosis not present

## 2020-03-14 DIAGNOSIS — N2581 Secondary hyperparathyroidism of renal origin: Secondary | ICD-10-CM | POA: Diagnosis not present

## 2020-03-15 DIAGNOSIS — K219 Gastro-esophageal reflux disease without esophagitis: Secondary | ICD-10-CM | POA: Diagnosis not present

## 2020-03-15 DIAGNOSIS — E1122 Type 2 diabetes mellitus with diabetic chronic kidney disease: Secondary | ICD-10-CM | POA: Diagnosis not present

## 2020-03-15 DIAGNOSIS — N186 End stage renal disease: Secondary | ICD-10-CM | POA: Diagnosis not present

## 2020-03-15 DIAGNOSIS — F419 Anxiety disorder, unspecified: Secondary | ICD-10-CM | POA: Diagnosis not present

## 2020-03-15 DIAGNOSIS — I110 Hypertensive heart disease with heart failure: Secondary | ICD-10-CM | POA: Diagnosis not present

## 2020-03-15 DIAGNOSIS — D649 Anemia, unspecified: Secondary | ICD-10-CM | POA: Diagnosis not present

## 2020-03-16 DIAGNOSIS — N2581 Secondary hyperparathyroidism of renal origin: Secondary | ICD-10-CM | POA: Diagnosis not present

## 2020-03-16 DIAGNOSIS — Z992 Dependence on renal dialysis: Secondary | ICD-10-CM | POA: Diagnosis not present

## 2020-03-16 DIAGNOSIS — N186 End stage renal disease: Secondary | ICD-10-CM | POA: Diagnosis not present

## 2020-03-16 DIAGNOSIS — D509 Iron deficiency anemia, unspecified: Secondary | ICD-10-CM | POA: Diagnosis not present

## 2020-03-16 DIAGNOSIS — D631 Anemia in chronic kidney disease: Secondary | ICD-10-CM | POA: Diagnosis not present

## 2020-03-19 DIAGNOSIS — Z992 Dependence on renal dialysis: Secondary | ICD-10-CM | POA: Diagnosis not present

## 2020-03-19 DIAGNOSIS — D631 Anemia in chronic kidney disease: Secondary | ICD-10-CM | POA: Diagnosis not present

## 2020-03-19 DIAGNOSIS — N186 End stage renal disease: Secondary | ICD-10-CM | POA: Diagnosis not present

## 2020-03-19 DIAGNOSIS — N2581 Secondary hyperparathyroidism of renal origin: Secondary | ICD-10-CM | POA: Diagnosis not present

## 2020-03-19 DIAGNOSIS — D509 Iron deficiency anemia, unspecified: Secondary | ICD-10-CM | POA: Diagnosis not present

## 2020-03-21 DIAGNOSIS — F419 Anxiety disorder, unspecified: Secondary | ICD-10-CM | POA: Diagnosis not present

## 2020-03-21 DIAGNOSIS — N2581 Secondary hyperparathyroidism of renal origin: Secondary | ICD-10-CM | POA: Diagnosis not present

## 2020-03-21 DIAGNOSIS — D509 Iron deficiency anemia, unspecified: Secondary | ICD-10-CM | POA: Diagnosis not present

## 2020-03-21 DIAGNOSIS — N186 End stage renal disease: Secondary | ICD-10-CM | POA: Diagnosis not present

## 2020-03-21 DIAGNOSIS — Z89511 Acquired absence of right leg below knee: Secondary | ICD-10-CM | POA: Diagnosis not present

## 2020-03-21 DIAGNOSIS — K219 Gastro-esophageal reflux disease without esophagitis: Secondary | ICD-10-CM | POA: Diagnosis not present

## 2020-03-21 DIAGNOSIS — Z992 Dependence on renal dialysis: Secondary | ICD-10-CM | POA: Diagnosis not present

## 2020-03-21 DIAGNOSIS — E1122 Type 2 diabetes mellitus with diabetic chronic kidney disease: Secondary | ICD-10-CM | POA: Diagnosis not present

## 2020-03-21 DIAGNOSIS — Z515 Encounter for palliative care: Secondary | ICD-10-CM | POA: Diagnosis not present

## 2020-03-21 DIAGNOSIS — D631 Anemia in chronic kidney disease: Secondary | ICD-10-CM | POA: Diagnosis not present

## 2020-03-21 DIAGNOSIS — Z89512 Acquired absence of left leg below knee: Secondary | ICD-10-CM | POA: Diagnosis not present

## 2020-03-21 DIAGNOSIS — I110 Hypertensive heart disease with heart failure: Secondary | ICD-10-CM | POA: Diagnosis not present

## 2020-03-21 DIAGNOSIS — D649 Anemia, unspecified: Secondary | ICD-10-CM | POA: Diagnosis not present

## 2020-03-21 DIAGNOSIS — Z853 Personal history of malignant neoplasm of breast: Secondary | ICD-10-CM | POA: Diagnosis not present

## 2020-03-23 DIAGNOSIS — N2581 Secondary hyperparathyroidism of renal origin: Secondary | ICD-10-CM | POA: Diagnosis not present

## 2020-03-23 DIAGNOSIS — N186 End stage renal disease: Secondary | ICD-10-CM | POA: Diagnosis not present

## 2020-03-23 DIAGNOSIS — D509 Iron deficiency anemia, unspecified: Secondary | ICD-10-CM | POA: Diagnosis not present

## 2020-03-23 DIAGNOSIS — Z992 Dependence on renal dialysis: Secondary | ICD-10-CM | POA: Diagnosis not present

## 2020-03-23 DIAGNOSIS — D631 Anemia in chronic kidney disease: Secondary | ICD-10-CM | POA: Diagnosis not present

## 2020-03-26 DIAGNOSIS — D631 Anemia in chronic kidney disease: Secondary | ICD-10-CM | POA: Diagnosis not present

## 2020-03-26 DIAGNOSIS — D509 Iron deficiency anemia, unspecified: Secondary | ICD-10-CM | POA: Diagnosis not present

## 2020-03-26 DIAGNOSIS — Z992 Dependence on renal dialysis: Secondary | ICD-10-CM | POA: Diagnosis not present

## 2020-03-26 DIAGNOSIS — N186 End stage renal disease: Secondary | ICD-10-CM | POA: Diagnosis not present

## 2020-03-26 DIAGNOSIS — N2581 Secondary hyperparathyroidism of renal origin: Secondary | ICD-10-CM | POA: Diagnosis not present

## 2020-03-27 DIAGNOSIS — E1122 Type 2 diabetes mellitus with diabetic chronic kidney disease: Secondary | ICD-10-CM | POA: Diagnosis not present

## 2020-03-27 DIAGNOSIS — K219 Gastro-esophageal reflux disease without esophagitis: Secondary | ICD-10-CM | POA: Diagnosis not present

## 2020-03-27 DIAGNOSIS — F419 Anxiety disorder, unspecified: Secondary | ICD-10-CM | POA: Diagnosis not present

## 2020-03-27 DIAGNOSIS — D649 Anemia, unspecified: Secondary | ICD-10-CM | POA: Diagnosis not present

## 2020-03-27 DIAGNOSIS — N186 End stage renal disease: Secondary | ICD-10-CM | POA: Diagnosis not present

## 2020-03-27 DIAGNOSIS — I110 Hypertensive heart disease with heart failure: Secondary | ICD-10-CM | POA: Diagnosis not present

## 2020-03-28 DIAGNOSIS — Z992 Dependence on renal dialysis: Secondary | ICD-10-CM | POA: Diagnosis not present

## 2020-03-28 DIAGNOSIS — N186 End stage renal disease: Secondary | ICD-10-CM | POA: Diagnosis not present

## 2020-03-28 DIAGNOSIS — D631 Anemia in chronic kidney disease: Secondary | ICD-10-CM | POA: Diagnosis not present

## 2020-03-28 DIAGNOSIS — N2581 Secondary hyperparathyroidism of renal origin: Secondary | ICD-10-CM | POA: Diagnosis not present

## 2020-03-28 DIAGNOSIS — D509 Iron deficiency anemia, unspecified: Secondary | ICD-10-CM | POA: Diagnosis not present

## 2020-03-29 ENCOUNTER — Other Ambulatory Visit: Payer: Self-pay | Admitting: *Deleted

## 2020-03-29 DIAGNOSIS — F419 Anxiety disorder, unspecified: Secondary | ICD-10-CM | POA: Diagnosis not present

## 2020-03-29 DIAGNOSIS — E1122 Type 2 diabetes mellitus with diabetic chronic kidney disease: Secondary | ICD-10-CM | POA: Diagnosis not present

## 2020-03-29 DIAGNOSIS — K219 Gastro-esophageal reflux disease without esophagitis: Secondary | ICD-10-CM | POA: Diagnosis not present

## 2020-03-29 DIAGNOSIS — N186 End stage renal disease: Secondary | ICD-10-CM | POA: Diagnosis not present

## 2020-03-29 DIAGNOSIS — D649 Anemia, unspecified: Secondary | ICD-10-CM | POA: Diagnosis not present

## 2020-03-29 DIAGNOSIS — I110 Hypertensive heart disease with heart failure: Secondary | ICD-10-CM | POA: Diagnosis not present

## 2020-03-29 NOTE — Patient Outreach (Signed)
[facility]) Care Management  03/29/2020  [name] August 05, 1950 [id number]   Telephone Assessment-Successful-Diabetes  RN spoke with both the pt and caregiver [name]) with an update on pt's ongoing management of care. Pt reports she is doing well with no acute issues reported today. RN review all medications with updates with the caregiver sister [name]). Pt continues dialysis M-W-F. Caregiver indicates she will make a request for an updated A1C and report on the next follow up call. Last read was March 6.0. Reports glucose readings around 118-130 however some maybe after pt's has eaten. RN discussed the importance of obtaining the readings as a fasting blood glucose read before pt's has eaten. RN requested the caregiver to write all reading down ( caregiver with an understanding). No other inquires, issues or needs to address at this time.   Plan of care discussed and adjusted accordingly with some goals met and others added. Will follow up next month with an update on pt's ongoing management of care.  Goals Addressed            This Visit's Progress   . [facility] Update 9/9   On track    [place] (see longtitudinal plan of care for additional care plan information)  Objective:  Lab Results  Component Value Date   HGBA1C 6.0 (H) 10/19/2019 .   Lab Results  Component Value Date   CREATININE 6.99 (H) 12/20/2019   CREATININE 4.15 (H) 12/17/2019   CREATININE 3.60 (H) 12/16/2019 .   Mar[place] Kitchen No results found for: EGFR  Current Barriers:  Marland Kitchen Knowledge Deficits related to basic Diabetes pathophysiology and self care/management  Case Manager Clinical Goal(s):  Over the next 60 days, patient will demonstrate improved adherence to prescribed treatment plan for diabetes self care/management as evidenced by:  Marland Kitchen Verbalize daily monitoring and recording of CBG within 28 days . Verbalize adherence to ADA/ carb modified diet within the next 28 daysGOAL MET 9/9 . Reports  updated A1c via dialysis center with the next 30 days.  Interventions:  . Provided education to patient about basic DM disease process . Discussed plans with patient for ongoing care management follow up and provided patient with direct contact information for care management team . Provided patient with written educational materials related to hypo and hyperglycemia and importance of correct treatment  Patient Self Care Activities:  . Self administers oral medications as prescribed . Attends all scheduled provider appointments . Checks blood sugars as prescribed and utilize hyper and hypoglycemia protocol as needed . Adheres to prescribed ADA/carb modified  Initial goal documentation        [name], RN Care Management Coordinator [place] Office [phone]

## 2020-03-30 DIAGNOSIS — N186 End stage renal disease: Secondary | ICD-10-CM | POA: Diagnosis not present

## 2020-03-30 DIAGNOSIS — D631 Anemia in chronic kidney disease: Secondary | ICD-10-CM | POA: Diagnosis not present

## 2020-03-30 DIAGNOSIS — Z992 Dependence on renal dialysis: Secondary | ICD-10-CM | POA: Diagnosis not present

## 2020-03-30 DIAGNOSIS — D509 Iron deficiency anemia, unspecified: Secondary | ICD-10-CM | POA: Diagnosis not present

## 2020-03-30 DIAGNOSIS — N2581 Secondary hyperparathyroidism of renal origin: Secondary | ICD-10-CM | POA: Diagnosis not present

## 2020-04-01 DIAGNOSIS — I110 Hypertensive heart disease with heart failure: Secondary | ICD-10-CM | POA: Diagnosis not present

## 2020-04-01 DIAGNOSIS — E1122 Type 2 diabetes mellitus with diabetic chronic kidney disease: Secondary | ICD-10-CM | POA: Diagnosis not present

## 2020-04-01 DIAGNOSIS — D649 Anemia, unspecified: Secondary | ICD-10-CM | POA: Diagnosis not present

## 2020-04-01 DIAGNOSIS — K219 Gastro-esophageal reflux disease without esophagitis: Secondary | ICD-10-CM | POA: Diagnosis not present

## 2020-04-01 DIAGNOSIS — F419 Anxiety disorder, unspecified: Secondary | ICD-10-CM | POA: Diagnosis not present

## 2020-04-01 DIAGNOSIS — N186 End stage renal disease: Secondary | ICD-10-CM | POA: Diagnosis not present

## 2020-04-02 DIAGNOSIS — N2581 Secondary hyperparathyroidism of renal origin: Secondary | ICD-10-CM | POA: Diagnosis not present

## 2020-04-02 DIAGNOSIS — D509 Iron deficiency anemia, unspecified: Secondary | ICD-10-CM | POA: Diagnosis not present

## 2020-04-02 DIAGNOSIS — Z992 Dependence on renal dialysis: Secondary | ICD-10-CM | POA: Diagnosis not present

## 2020-04-02 DIAGNOSIS — N186 End stage renal disease: Secondary | ICD-10-CM | POA: Diagnosis not present

## 2020-04-02 DIAGNOSIS — D631 Anemia in chronic kidney disease: Secondary | ICD-10-CM | POA: Diagnosis not present

## 2020-04-03 DIAGNOSIS — I110 Hypertensive heart disease with heart failure: Secondary | ICD-10-CM | POA: Diagnosis not present

## 2020-04-03 DIAGNOSIS — K219 Gastro-esophageal reflux disease without esophagitis: Secondary | ICD-10-CM | POA: Diagnosis not present

## 2020-04-03 DIAGNOSIS — E1122 Type 2 diabetes mellitus with diabetic chronic kidney disease: Secondary | ICD-10-CM | POA: Diagnosis not present

## 2020-04-03 DIAGNOSIS — D649 Anemia, unspecified: Secondary | ICD-10-CM | POA: Diagnosis not present

## 2020-04-03 DIAGNOSIS — N186 End stage renal disease: Secondary | ICD-10-CM | POA: Diagnosis not present

## 2020-04-03 DIAGNOSIS — F419 Anxiety disorder, unspecified: Secondary | ICD-10-CM | POA: Diagnosis not present

## 2020-04-04 DIAGNOSIS — E1122 Type 2 diabetes mellitus with diabetic chronic kidney disease: Secondary | ICD-10-CM | POA: Diagnosis not present

## 2020-04-04 DIAGNOSIS — Z992 Dependence on renal dialysis: Secondary | ICD-10-CM | POA: Diagnosis not present

## 2020-04-04 DIAGNOSIS — D509 Iron deficiency anemia, unspecified: Secondary | ICD-10-CM | POA: Diagnosis not present

## 2020-04-04 DIAGNOSIS — D649 Anemia, unspecified: Secondary | ICD-10-CM | POA: Diagnosis not present

## 2020-04-04 DIAGNOSIS — F419 Anxiety disorder, unspecified: Secondary | ICD-10-CM | POA: Diagnosis not present

## 2020-04-04 DIAGNOSIS — N2581 Secondary hyperparathyroidism of renal origin: Secondary | ICD-10-CM | POA: Diagnosis not present

## 2020-04-04 DIAGNOSIS — K219 Gastro-esophageal reflux disease without esophagitis: Secondary | ICD-10-CM | POA: Diagnosis not present

## 2020-04-04 DIAGNOSIS — N186 End stage renal disease: Secondary | ICD-10-CM | POA: Diagnosis not present

## 2020-04-04 DIAGNOSIS — D631 Anemia in chronic kidney disease: Secondary | ICD-10-CM | POA: Diagnosis not present

## 2020-04-04 DIAGNOSIS — I110 Hypertensive heart disease with heart failure: Secondary | ICD-10-CM | POA: Diagnosis not present

## 2020-04-05 DIAGNOSIS — F419 Anxiety disorder, unspecified: Secondary | ICD-10-CM | POA: Diagnosis not present

## 2020-04-05 DIAGNOSIS — D649 Anemia, unspecified: Secondary | ICD-10-CM | POA: Diagnosis not present

## 2020-04-05 DIAGNOSIS — I110 Hypertensive heart disease with heart failure: Secondary | ICD-10-CM | POA: Diagnosis not present

## 2020-04-05 DIAGNOSIS — K219 Gastro-esophageal reflux disease without esophagitis: Secondary | ICD-10-CM | POA: Diagnosis not present

## 2020-04-05 DIAGNOSIS — E1122 Type 2 diabetes mellitus with diabetic chronic kidney disease: Secondary | ICD-10-CM | POA: Diagnosis not present

## 2020-04-05 DIAGNOSIS — N186 End stage renal disease: Secondary | ICD-10-CM | POA: Diagnosis not present

## 2020-04-06 DIAGNOSIS — D631 Anemia in chronic kidney disease: Secondary | ICD-10-CM | POA: Diagnosis not present

## 2020-04-06 DIAGNOSIS — Z992 Dependence on renal dialysis: Secondary | ICD-10-CM | POA: Diagnosis not present

## 2020-04-06 DIAGNOSIS — D509 Iron deficiency anemia, unspecified: Secondary | ICD-10-CM | POA: Diagnosis not present

## 2020-04-06 DIAGNOSIS — N186 End stage renal disease: Secondary | ICD-10-CM | POA: Diagnosis not present

## 2020-04-06 DIAGNOSIS — N2581 Secondary hyperparathyroidism of renal origin: Secondary | ICD-10-CM | POA: Diagnosis not present

## 2020-04-09 DIAGNOSIS — N2581 Secondary hyperparathyroidism of renal origin: Secondary | ICD-10-CM | POA: Diagnosis not present

## 2020-04-09 DIAGNOSIS — Z992 Dependence on renal dialysis: Secondary | ICD-10-CM | POA: Diagnosis not present

## 2020-04-09 DIAGNOSIS — D509 Iron deficiency anemia, unspecified: Secondary | ICD-10-CM | POA: Diagnosis not present

## 2020-04-09 DIAGNOSIS — D631 Anemia in chronic kidney disease: Secondary | ICD-10-CM | POA: Diagnosis not present

## 2020-04-09 DIAGNOSIS — N186 End stage renal disease: Secondary | ICD-10-CM | POA: Diagnosis not present

## 2020-04-10 DIAGNOSIS — E1122 Type 2 diabetes mellitus with diabetic chronic kidney disease: Secondary | ICD-10-CM | POA: Diagnosis not present

## 2020-04-10 DIAGNOSIS — I110 Hypertensive heart disease with heart failure: Secondary | ICD-10-CM | POA: Diagnosis not present

## 2020-04-10 DIAGNOSIS — F419 Anxiety disorder, unspecified: Secondary | ICD-10-CM | POA: Diagnosis not present

## 2020-04-10 DIAGNOSIS — K219 Gastro-esophageal reflux disease without esophagitis: Secondary | ICD-10-CM | POA: Diagnosis not present

## 2020-04-10 DIAGNOSIS — N186 End stage renal disease: Secondary | ICD-10-CM | POA: Diagnosis not present

## 2020-04-10 DIAGNOSIS — D649 Anemia, unspecified: Secondary | ICD-10-CM | POA: Diagnosis not present

## 2020-04-11 DIAGNOSIS — N2581 Secondary hyperparathyroidism of renal origin: Secondary | ICD-10-CM | POA: Diagnosis not present

## 2020-04-11 DIAGNOSIS — N186 End stage renal disease: Secondary | ICD-10-CM | POA: Diagnosis not present

## 2020-04-11 DIAGNOSIS — D509 Iron deficiency anemia, unspecified: Secondary | ICD-10-CM | POA: Diagnosis not present

## 2020-04-11 DIAGNOSIS — Z992 Dependence on renal dialysis: Secondary | ICD-10-CM | POA: Diagnosis not present

## 2020-04-11 DIAGNOSIS — D631 Anemia in chronic kidney disease: Secondary | ICD-10-CM | POA: Diagnosis not present

## 2020-04-13 DIAGNOSIS — N2581 Secondary hyperparathyroidism of renal origin: Secondary | ICD-10-CM | POA: Diagnosis not present

## 2020-04-13 DIAGNOSIS — N186 End stage renal disease: Secondary | ICD-10-CM | POA: Diagnosis not present

## 2020-04-13 DIAGNOSIS — Z992 Dependence on renal dialysis: Secondary | ICD-10-CM | POA: Diagnosis not present

## 2020-04-13 DIAGNOSIS — D509 Iron deficiency anemia, unspecified: Secondary | ICD-10-CM | POA: Diagnosis not present

## 2020-04-13 DIAGNOSIS — D631 Anemia in chronic kidney disease: Secondary | ICD-10-CM | POA: Diagnosis not present

## 2020-04-16 ENCOUNTER — Telehealth: Payer: Self-pay | Admitting: Cardiovascular Disease

## 2020-04-16 DIAGNOSIS — D509 Iron deficiency anemia, unspecified: Secondary | ICD-10-CM | POA: Diagnosis not present

## 2020-04-16 DIAGNOSIS — Z992 Dependence on renal dialysis: Secondary | ICD-10-CM | POA: Diagnosis not present

## 2020-04-16 DIAGNOSIS — N186 End stage renal disease: Secondary | ICD-10-CM | POA: Diagnosis not present

## 2020-04-16 DIAGNOSIS — D631 Anemia in chronic kidney disease: Secondary | ICD-10-CM | POA: Diagnosis not present

## 2020-04-16 DIAGNOSIS — N2581 Secondary hyperparathyroidism of renal origin: Secondary | ICD-10-CM | POA: Diagnosis not present

## 2020-04-16 NOTE — Telephone Encounter (Signed)
[facility] is calling to set up appt for patient. Says that appt needed is urgent due to heart problems that is accumalating. Please call back Eudelia Bunch at [phone] Ext [phone]

## 2020-04-16 NOTE — Telephone Encounter (Signed)
Will send this message to the scheduling team and chart prep to please reach out to the pt to set up New Pt appt.

## 2020-04-17 DIAGNOSIS — N186 End stage renal disease: Secondary | ICD-10-CM | POA: Diagnosis not present

## 2020-04-17 DIAGNOSIS — D649 Anemia, unspecified: Secondary | ICD-10-CM | POA: Diagnosis not present

## 2020-04-17 DIAGNOSIS — K219 Gastro-esophageal reflux disease without esophagitis: Secondary | ICD-10-CM | POA: Diagnosis not present

## 2020-04-17 DIAGNOSIS — I110 Hypertensive heart disease with heart failure: Secondary | ICD-10-CM | POA: Diagnosis not present

## 2020-04-17 DIAGNOSIS — F419 Anxiety disorder, unspecified: Secondary | ICD-10-CM | POA: Diagnosis not present

## 2020-04-17 DIAGNOSIS — E1122 Type 2 diabetes mellitus with diabetic chronic kidney disease: Secondary | ICD-10-CM | POA: Diagnosis not present

## 2020-04-18 DIAGNOSIS — Z992 Dependence on renal dialysis: Secondary | ICD-10-CM | POA: Diagnosis not present

## 2020-04-18 DIAGNOSIS — N186 End stage renal disease: Secondary | ICD-10-CM | POA: Diagnosis not present

## 2020-04-18 DIAGNOSIS — D509 Iron deficiency anemia, unspecified: Secondary | ICD-10-CM | POA: Diagnosis not present

## 2020-04-18 DIAGNOSIS — D631 Anemia in chronic kidney disease: Secondary | ICD-10-CM | POA: Diagnosis not present

## 2020-04-18 DIAGNOSIS — N2581 Secondary hyperparathyroidism of renal origin: Secondary | ICD-10-CM | POA: Diagnosis not present

## 2020-04-19 DIAGNOSIS — D649 Anemia, unspecified: Secondary | ICD-10-CM | POA: Diagnosis not present

## 2020-04-19 DIAGNOSIS — K219 Gastro-esophageal reflux disease without esophagitis: Secondary | ICD-10-CM | POA: Diagnosis not present

## 2020-04-19 DIAGNOSIS — N186 End stage renal disease: Secondary | ICD-10-CM | POA: Diagnosis not present

## 2020-04-19 DIAGNOSIS — F419 Anxiety disorder, unspecified: Secondary | ICD-10-CM | POA: Diagnosis not present

## 2020-04-19 DIAGNOSIS — I110 Hypertensive heart disease with heart failure: Secondary | ICD-10-CM | POA: Diagnosis not present

## 2020-04-19 DIAGNOSIS — E1122 Type 2 diabetes mellitus with diabetic chronic kidney disease: Secondary | ICD-10-CM | POA: Diagnosis not present

## 2020-04-20 DIAGNOSIS — K219 Gastro-esophageal reflux disease without esophagitis: Secondary | ICD-10-CM | POA: Diagnosis not present

## 2020-04-20 DIAGNOSIS — Z89512 Acquired absence of left leg below knee: Secondary | ICD-10-CM | POA: Diagnosis not present

## 2020-04-20 DIAGNOSIS — F419 Anxiety disorder, unspecified: Secondary | ICD-10-CM | POA: Diagnosis not present

## 2020-04-20 DIAGNOSIS — Z853 Personal history of malignant neoplasm of breast: Secondary | ICD-10-CM | POA: Diagnosis not present

## 2020-04-20 DIAGNOSIS — D509 Iron deficiency anemia, unspecified: Secondary | ICD-10-CM | POA: Diagnosis not present

## 2020-04-20 DIAGNOSIS — E1122 Type 2 diabetes mellitus with diabetic chronic kidney disease: Secondary | ICD-10-CM | POA: Diagnosis not present

## 2020-04-20 DIAGNOSIS — N2581 Secondary hyperparathyroidism of renal origin: Secondary | ICD-10-CM | POA: Diagnosis not present

## 2020-04-20 DIAGNOSIS — Z23 Encounter for immunization: Secondary | ICD-10-CM | POA: Diagnosis not present

## 2020-04-20 DIAGNOSIS — Z89511 Acquired absence of right leg below knee: Secondary | ICD-10-CM | POA: Diagnosis not present

## 2020-04-20 DIAGNOSIS — D649 Anemia, unspecified: Secondary | ICD-10-CM | POA: Diagnosis not present

## 2020-04-20 DIAGNOSIS — N186 End stage renal disease: Secondary | ICD-10-CM | POA: Diagnosis not present

## 2020-04-20 DIAGNOSIS — Z992 Dependence on renal dialysis: Secondary | ICD-10-CM | POA: Diagnosis not present

## 2020-04-20 DIAGNOSIS — Z515 Encounter for palliative care: Secondary | ICD-10-CM | POA: Diagnosis not present

## 2020-04-20 DIAGNOSIS — D631 Anemia in chronic kidney disease: Secondary | ICD-10-CM | POA: Diagnosis not present

## 2020-04-20 DIAGNOSIS — I110 Hypertensive heart disease with heart failure: Secondary | ICD-10-CM | POA: Diagnosis not present

## 2020-04-20 DIAGNOSIS — E1129 Type 2 diabetes mellitus with other diabetic kidney complication: Secondary | ICD-10-CM | POA: Diagnosis not present

## 2020-04-23 DIAGNOSIS — F419 Anxiety disorder, unspecified: Secondary | ICD-10-CM | POA: Diagnosis not present

## 2020-04-23 DIAGNOSIS — Z23 Encounter for immunization: Secondary | ICD-10-CM | POA: Diagnosis not present

## 2020-04-23 DIAGNOSIS — D631 Anemia in chronic kidney disease: Secondary | ICD-10-CM | POA: Diagnosis not present

## 2020-04-23 DIAGNOSIS — I110 Hypertensive heart disease with heart failure: Secondary | ICD-10-CM | POA: Diagnosis not present

## 2020-04-23 DIAGNOSIS — D509 Iron deficiency anemia, unspecified: Secondary | ICD-10-CM | POA: Diagnosis not present

## 2020-04-23 DIAGNOSIS — E1122 Type 2 diabetes mellitus with diabetic chronic kidney disease: Secondary | ICD-10-CM | POA: Diagnosis not present

## 2020-04-23 DIAGNOSIS — Z992 Dependence on renal dialysis: Secondary | ICD-10-CM | POA: Diagnosis not present

## 2020-04-23 DIAGNOSIS — N2581 Secondary hyperparathyroidism of renal origin: Secondary | ICD-10-CM | POA: Diagnosis not present

## 2020-04-23 DIAGNOSIS — N186 End stage renal disease: Secondary | ICD-10-CM | POA: Diagnosis not present

## 2020-04-23 DIAGNOSIS — K219 Gastro-esophageal reflux disease without esophagitis: Secondary | ICD-10-CM | POA: Diagnosis not present

## 2020-04-23 DIAGNOSIS — D649 Anemia, unspecified: Secondary | ICD-10-CM | POA: Diagnosis not present

## 2020-04-24 DIAGNOSIS — K219 Gastro-esophageal reflux disease without esophagitis: Secondary | ICD-10-CM | POA: Diagnosis not present

## 2020-04-24 DIAGNOSIS — N186 End stage renal disease: Secondary | ICD-10-CM | POA: Diagnosis not present

## 2020-04-24 DIAGNOSIS — F419 Anxiety disorder, unspecified: Secondary | ICD-10-CM | POA: Diagnosis not present

## 2020-04-24 DIAGNOSIS — E1122 Type 2 diabetes mellitus with diabetic chronic kidney disease: Secondary | ICD-10-CM | POA: Diagnosis not present

## 2020-04-24 DIAGNOSIS — D649 Anemia, unspecified: Secondary | ICD-10-CM | POA: Diagnosis not present

## 2020-04-24 DIAGNOSIS — I110 Hypertensive heart disease with heart failure: Secondary | ICD-10-CM | POA: Diagnosis not present

## 2020-04-25 DIAGNOSIS — N2581 Secondary hyperparathyroidism of renal origin: Secondary | ICD-10-CM | POA: Diagnosis not present

## 2020-04-25 DIAGNOSIS — E1142 Type 2 diabetes mellitus with diabetic polyneuropathy: Secondary | ICD-10-CM | POA: Diagnosis not present

## 2020-04-25 DIAGNOSIS — D509 Iron deficiency anemia, unspecified: Secondary | ICD-10-CM | POA: Diagnosis not present

## 2020-04-25 DIAGNOSIS — Z23 Encounter for immunization: Secondary | ICD-10-CM | POA: Diagnosis not present

## 2020-04-25 DIAGNOSIS — D631 Anemia in chronic kidney disease: Secondary | ICD-10-CM | POA: Diagnosis not present

## 2020-04-25 DIAGNOSIS — N186 End stage renal disease: Secondary | ICD-10-CM | POA: Diagnosis not present

## 2020-04-25 DIAGNOSIS — Z992 Dependence on renal dialysis: Secondary | ICD-10-CM | POA: Diagnosis not present

## 2020-04-27 DIAGNOSIS — Z23 Encounter for immunization: Secondary | ICD-10-CM | POA: Diagnosis not present

## 2020-04-27 DIAGNOSIS — N2581 Secondary hyperparathyroidism of renal origin: Secondary | ICD-10-CM | POA: Diagnosis not present

## 2020-04-27 DIAGNOSIS — D631 Anemia in chronic kidney disease: Secondary | ICD-10-CM | POA: Diagnosis not present

## 2020-04-27 DIAGNOSIS — Z992 Dependence on renal dialysis: Secondary | ICD-10-CM | POA: Diagnosis not present

## 2020-04-27 DIAGNOSIS — N186 End stage renal disease: Secondary | ICD-10-CM | POA: Diagnosis not present

## 2020-04-27 DIAGNOSIS — D509 Iron deficiency anemia, unspecified: Secondary | ICD-10-CM | POA: Diagnosis not present

## 2020-04-30 DIAGNOSIS — N2581 Secondary hyperparathyroidism of renal origin: Secondary | ICD-10-CM | POA: Diagnosis not present

## 2020-04-30 DIAGNOSIS — D509 Iron deficiency anemia, unspecified: Secondary | ICD-10-CM | POA: Diagnosis not present

## 2020-04-30 DIAGNOSIS — Z23 Encounter for immunization: Secondary | ICD-10-CM | POA: Diagnosis not present

## 2020-04-30 DIAGNOSIS — N186 End stage renal disease: Secondary | ICD-10-CM | POA: Diagnosis not present

## 2020-04-30 DIAGNOSIS — D631 Anemia in chronic kidney disease: Secondary | ICD-10-CM | POA: Diagnosis not present

## 2020-04-30 DIAGNOSIS — Z992 Dependence on renal dialysis: Secondary | ICD-10-CM | POA: Diagnosis not present

## 2020-05-01 ENCOUNTER — Other Ambulatory Visit: Payer: Self-pay | Admitting: *Deleted

## 2020-05-01 DIAGNOSIS — F419 Anxiety disorder, unspecified: Secondary | ICD-10-CM | POA: Diagnosis not present

## 2020-05-01 DIAGNOSIS — E1122 Type 2 diabetes mellitus with diabetic chronic kidney disease: Secondary | ICD-10-CM | POA: Diagnosis not present

## 2020-05-01 DIAGNOSIS — D649 Anemia, unspecified: Secondary | ICD-10-CM | POA: Diagnosis not present

## 2020-05-01 DIAGNOSIS — N186 End stage renal disease: Secondary | ICD-10-CM | POA: Diagnosis not present

## 2020-05-01 DIAGNOSIS — I110 Hypertensive heart disease with heart failure: Secondary | ICD-10-CM | POA: Diagnosis not present

## 2020-05-01 DIAGNOSIS — K219 Gastro-esophageal reflux disease without esophagitis: Secondary | ICD-10-CM | POA: Diagnosis not present

## 2020-05-01 NOTE — Patient Outreach (Signed)
[facility]) Care Management  05/01/2020  [name] July 12, 1951 [id number]  Telephone Assessment-Successful-Diabetes  RN spoke with pt who requested RN to speak with his sister [name] for an update. RN spoke with regina who provided today's assessment on pt's behalf. States pt is doing "very well" with no acute events or issues. States her glucose readings have been ranging in the low 100 with this morning read 112. Reports pt is moving more and participating in her ADLs.  Pt has desaturated on several occassions and now on home O2. Reports Palliative services from [facility] is also being provided. RN inquired on the latest A1C however remains pending at this time. RN inquired if the dialysis center may provide an update. Sister will inquired on the last reading and scheduling of an update if needed.  Plan of care reviewed and discussed with any potential barriers as RN case management continues to encouraged adherence with her daily management of care. Discussed the normal A1c and who to obtain improvded readings with pt's ongoing management of care.   Will follow up next month and continue to communicate with the pt's provider on pt's disposition with [facility] services. No further inquires or request at this time.  Goals Addressed            This Visit's Progress   . COMPLETED: THN Update 9/9       CARE PLAN ENTRY (see longtitudinal plan of care for additional care plan information)  Objective:  Lab Results  Component Value Date   HGBA1C 6.0 (H) 10/19/2019 .   Lab Results  Component Value Date   CREATININE 6.99 (H) 12/20/2019   CREATININE 4.15 (H) 12/17/2019   CREATININE 3.60 (H) 12/16/2019 .   Marland Kitchen No results found for: EGFR  Current Barriers:  Marland Kitchen Knowledge Deficits related to basic Diabetes pathophysiology and self care/management  Case Manager Clinical Goal(s):  Over the next 60 days, patient will demonstrate improved adherence to prescribed treatment plan  for diabetes self care/management as evidenced by:  Marland Kitchen Verbalize daily monitoring and recording of CBG within 28 days . Verbalize adherence to ADA/ carb modified diet within the next 28 daysGOAL MET 9/9 . Reports updated A1c via dialysis center with the next 30 days.  Interventions:  . Provided education to patient about basic DM disease process . Discussed plans with patient for ongoing care management follow up and provided patient with direct contact information for care management team . Provided patient with written educational materials related to hypo and hyperglycemia and importance of correct treatment  Patient Self Care Activities:  . Self administers oral medications as prescribed . Attends all scheduled provider appointments . Checks blood sugars as prescribed and utilize hyper and hypoglycemia protocol as needed . Adheres to prescribed ADA/carb modified  Initial goal documentation RESOLVING DUE TO DUPLICATE GOALS    . THN-Monitor and Manage My Blood Sugar       Follow Up Date 05/31/2020   - check blood sugar at prescribed times - check blood sugar if I feel it is too high or too low - take the blood sugar log to all doctor visits - take the blood sugar meter to all doctor visits    Why is this important?   Checking your blood sugar at home helps to keep it from getting very high or very low.  Writing the results in a diary or log helps the doctor know how to care for you.  Your blood sugar log should have  the time, date and the results.  Also, write down the amount of insulin or other medicine that you take.  Other information, like what you ate, exercise done and how you were feeling, will also be helpful.     Notes:     . THN-Set My Target A1C       Follow Up Date 05/31/2020   - set target A1C    Why is this important?   Your target A1C is decided together by you and your doctor.  It is based on several things like your age and other health issues.    Notes:         [name], RN Care Management Coordinator [place] Office [phone]

## 2020-05-02 DIAGNOSIS — N186 End stage renal disease: Secondary | ICD-10-CM | POA: Diagnosis not present

## 2020-05-02 DIAGNOSIS — D509 Iron deficiency anemia, unspecified: Secondary | ICD-10-CM | POA: Diagnosis not present

## 2020-05-02 DIAGNOSIS — Z23 Encounter for immunization: Secondary | ICD-10-CM | POA: Diagnosis not present

## 2020-05-02 DIAGNOSIS — D631 Anemia in chronic kidney disease: Secondary | ICD-10-CM | POA: Diagnosis not present

## 2020-05-02 DIAGNOSIS — Z992 Dependence on renal dialysis: Secondary | ICD-10-CM | POA: Diagnosis not present

## 2020-05-02 DIAGNOSIS — N2581 Secondary hyperparathyroidism of renal origin: Secondary | ICD-10-CM | POA: Diagnosis not present

## 2020-05-03 DIAGNOSIS — I110 Hypertensive heart disease with heart failure: Secondary | ICD-10-CM | POA: Diagnosis not present

## 2020-05-03 DIAGNOSIS — K219 Gastro-esophageal reflux disease without esophagitis: Secondary | ICD-10-CM | POA: Diagnosis not present

## 2020-05-03 DIAGNOSIS — E1122 Type 2 diabetes mellitus with diabetic chronic kidney disease: Secondary | ICD-10-CM | POA: Diagnosis not present

## 2020-05-03 DIAGNOSIS — N186 End stage renal disease: Secondary | ICD-10-CM | POA: Diagnosis not present

## 2020-05-03 DIAGNOSIS — F419 Anxiety disorder, unspecified: Secondary | ICD-10-CM | POA: Diagnosis not present

## 2020-05-03 DIAGNOSIS — D649 Anemia, unspecified: Secondary | ICD-10-CM | POA: Diagnosis not present

## 2020-05-04 DIAGNOSIS — Z23 Encounter for immunization: Secondary | ICD-10-CM | POA: Diagnosis not present

## 2020-05-04 DIAGNOSIS — D509 Iron deficiency anemia, unspecified: Secondary | ICD-10-CM | POA: Diagnosis not present

## 2020-05-04 DIAGNOSIS — Z992 Dependence on renal dialysis: Secondary | ICD-10-CM | POA: Diagnosis not present

## 2020-05-04 DIAGNOSIS — N186 End stage renal disease: Secondary | ICD-10-CM | POA: Diagnosis not present

## 2020-05-04 DIAGNOSIS — D631 Anemia in chronic kidney disease: Secondary | ICD-10-CM | POA: Diagnosis not present

## 2020-05-04 DIAGNOSIS — N2581 Secondary hyperparathyroidism of renal origin: Secondary | ICD-10-CM | POA: Diagnosis not present

## 2020-05-05 DIAGNOSIS — D649 Anemia, unspecified: Secondary | ICD-10-CM | POA: Diagnosis not present

## 2020-05-05 DIAGNOSIS — E1122 Type 2 diabetes mellitus with diabetic chronic kidney disease: Secondary | ICD-10-CM | POA: Diagnosis not present

## 2020-05-05 DIAGNOSIS — K219 Gastro-esophageal reflux disease without esophagitis: Secondary | ICD-10-CM | POA: Diagnosis not present

## 2020-05-05 DIAGNOSIS — I110 Hypertensive heart disease with heart failure: Secondary | ICD-10-CM | POA: Diagnosis not present

## 2020-05-05 DIAGNOSIS — F419 Anxiety disorder, unspecified: Secondary | ICD-10-CM | POA: Diagnosis not present

## 2020-05-05 DIAGNOSIS — N186 End stage renal disease: Secondary | ICD-10-CM | POA: Diagnosis not present

## 2020-05-07 DIAGNOSIS — D631 Anemia in chronic kidney disease: Secondary | ICD-10-CM | POA: Diagnosis not present

## 2020-05-07 DIAGNOSIS — N2581 Secondary hyperparathyroidism of renal origin: Secondary | ICD-10-CM | POA: Diagnosis not present

## 2020-05-07 DIAGNOSIS — D509 Iron deficiency anemia, unspecified: Secondary | ICD-10-CM | POA: Diagnosis not present

## 2020-05-07 DIAGNOSIS — N186 End stage renal disease: Secondary | ICD-10-CM | POA: Diagnosis not present

## 2020-05-07 DIAGNOSIS — Z992 Dependence on renal dialysis: Secondary | ICD-10-CM | POA: Diagnosis not present

## 2020-05-07 DIAGNOSIS — Z23 Encounter for immunization: Secondary | ICD-10-CM | POA: Diagnosis not present

## 2020-05-08 ENCOUNTER — Ambulatory Visit: Payer: PRIVATE HEALTH INSURANCE | Admitting: Cardiology

## 2020-05-08 DIAGNOSIS — K219 Gastro-esophageal reflux disease without esophagitis: Secondary | ICD-10-CM | POA: Diagnosis not present

## 2020-05-08 DIAGNOSIS — D649 Anemia, unspecified: Secondary | ICD-10-CM | POA: Diagnosis not present

## 2020-05-08 DIAGNOSIS — N186 End stage renal disease: Secondary | ICD-10-CM | POA: Diagnosis not present

## 2020-05-08 DIAGNOSIS — F419 Anxiety disorder, unspecified: Secondary | ICD-10-CM | POA: Diagnosis not present

## 2020-05-08 DIAGNOSIS — E1122 Type 2 diabetes mellitus with diabetic chronic kidney disease: Secondary | ICD-10-CM | POA: Diagnosis not present

## 2020-05-08 DIAGNOSIS — I110 Hypertensive heart disease with heart failure: Secondary | ICD-10-CM | POA: Diagnosis not present

## 2020-05-09 DIAGNOSIS — N186 End stage renal disease: Secondary | ICD-10-CM | POA: Diagnosis not present

## 2020-05-09 DIAGNOSIS — N2581 Secondary hyperparathyroidism of renal origin: Secondary | ICD-10-CM | POA: Diagnosis not present

## 2020-05-09 DIAGNOSIS — D631 Anemia in chronic kidney disease: Secondary | ICD-10-CM | POA: Diagnosis not present

## 2020-05-09 DIAGNOSIS — D509 Iron deficiency anemia, unspecified: Secondary | ICD-10-CM | POA: Diagnosis not present

## 2020-05-09 DIAGNOSIS — Z23 Encounter for immunization: Secondary | ICD-10-CM | POA: Diagnosis not present

## 2020-05-09 DIAGNOSIS — Z992 Dependence on renal dialysis: Secondary | ICD-10-CM | POA: Diagnosis not present

## 2020-05-10 ENCOUNTER — Encounter: Payer: PRIVATE HEALTH INSURANCE | Admitting: Physical Therapy

## 2020-05-10 DIAGNOSIS — F419 Anxiety disorder, unspecified: Secondary | ICD-10-CM | POA: Diagnosis not present

## 2020-05-10 DIAGNOSIS — N186 End stage renal disease: Secondary | ICD-10-CM | POA: Diagnosis not present

## 2020-05-10 DIAGNOSIS — I110 Hypertensive heart disease with heart failure: Secondary | ICD-10-CM | POA: Diagnosis not present

## 2020-05-10 DIAGNOSIS — K219 Gastro-esophageal reflux disease without esophagitis: Secondary | ICD-10-CM | POA: Diagnosis not present

## 2020-05-10 DIAGNOSIS — E1122 Type 2 diabetes mellitus with diabetic chronic kidney disease: Secondary | ICD-10-CM | POA: Diagnosis not present

## 2020-05-10 DIAGNOSIS — D649 Anemia, unspecified: Secondary | ICD-10-CM | POA: Diagnosis not present

## 2020-05-11 DIAGNOSIS — Z23 Encounter for immunization: Secondary | ICD-10-CM | POA: Diagnosis not present

## 2020-05-11 DIAGNOSIS — Z992 Dependence on renal dialysis: Secondary | ICD-10-CM | POA: Diagnosis not present

## 2020-05-11 DIAGNOSIS — N2581 Secondary hyperparathyroidism of renal origin: Secondary | ICD-10-CM | POA: Diagnosis not present

## 2020-05-11 DIAGNOSIS — D631 Anemia in chronic kidney disease: Secondary | ICD-10-CM | POA: Diagnosis not present

## 2020-05-11 DIAGNOSIS — N186 End stage renal disease: Secondary | ICD-10-CM | POA: Diagnosis not present

## 2020-05-11 DIAGNOSIS — D509 Iron deficiency anemia, unspecified: Secondary | ICD-10-CM | POA: Diagnosis not present

## 2020-05-14 DIAGNOSIS — N186 End stage renal disease: Secondary | ICD-10-CM | POA: Diagnosis not present

## 2020-05-14 DIAGNOSIS — K219 Gastro-esophageal reflux disease without esophagitis: Secondary | ICD-10-CM | POA: Diagnosis not present

## 2020-05-14 DIAGNOSIS — F419 Anxiety disorder, unspecified: Secondary | ICD-10-CM | POA: Diagnosis not present

## 2020-05-14 DIAGNOSIS — Z992 Dependence on renal dialysis: Secondary | ICD-10-CM | POA: Diagnosis not present

## 2020-05-14 DIAGNOSIS — I110 Hypertensive heart disease with heart failure: Secondary | ICD-10-CM | POA: Diagnosis not present

## 2020-05-14 DIAGNOSIS — N2581 Secondary hyperparathyroidism of renal origin: Secondary | ICD-10-CM | POA: Diagnosis not present

## 2020-05-14 DIAGNOSIS — D631 Anemia in chronic kidney disease: Secondary | ICD-10-CM | POA: Diagnosis not present

## 2020-05-14 DIAGNOSIS — Z23 Encounter for immunization: Secondary | ICD-10-CM | POA: Diagnosis not present

## 2020-05-14 DIAGNOSIS — D509 Iron deficiency anemia, unspecified: Secondary | ICD-10-CM | POA: Diagnosis not present

## 2020-05-14 DIAGNOSIS — D649 Anemia, unspecified: Secondary | ICD-10-CM | POA: Diagnosis not present

## 2020-05-14 DIAGNOSIS — E1122 Type 2 diabetes mellitus with diabetic chronic kidney disease: Secondary | ICD-10-CM | POA: Diagnosis not present

## 2020-05-15 ENCOUNTER — Encounter: Payer: Medicare Other | Admitting: Physical Therapy

## 2020-05-15 ENCOUNTER — Other Ambulatory Visit: Payer: Self-pay

## 2020-05-15 DIAGNOSIS — I110 Hypertensive heart disease with heart failure: Secondary | ICD-10-CM | POA: Diagnosis not present

## 2020-05-15 DIAGNOSIS — F419 Anxiety disorder, unspecified: Secondary | ICD-10-CM | POA: Diagnosis not present

## 2020-05-15 DIAGNOSIS — K219 Gastro-esophageal reflux disease without esophagitis: Secondary | ICD-10-CM | POA: Diagnosis not present

## 2020-05-15 DIAGNOSIS — N186 End stage renal disease: Secondary | ICD-10-CM | POA: Diagnosis not present

## 2020-05-15 DIAGNOSIS — E1122 Type 2 diabetes mellitus with diabetic chronic kidney disease: Secondary | ICD-10-CM | POA: Diagnosis not present

## 2020-05-15 DIAGNOSIS — D649 Anemia, unspecified: Secondary | ICD-10-CM | POA: Diagnosis not present

## 2020-05-16 DIAGNOSIS — N2581 Secondary hyperparathyroidism of renal origin: Secondary | ICD-10-CM | POA: Diagnosis not present

## 2020-05-16 DIAGNOSIS — N186 End stage renal disease: Secondary | ICD-10-CM | POA: Diagnosis not present

## 2020-05-16 DIAGNOSIS — D509 Iron deficiency anemia, unspecified: Secondary | ICD-10-CM | POA: Diagnosis not present

## 2020-05-16 DIAGNOSIS — Z23 Encounter for immunization: Secondary | ICD-10-CM | POA: Diagnosis not present

## 2020-05-16 DIAGNOSIS — Z992 Dependence on renal dialysis: Secondary | ICD-10-CM | POA: Diagnosis not present

## 2020-05-16 DIAGNOSIS — D631 Anemia in chronic kidney disease: Secondary | ICD-10-CM | POA: Diagnosis not present

## 2020-05-17 DIAGNOSIS — N186 End stage renal disease: Secondary | ICD-10-CM | POA: Diagnosis not present

## 2020-05-17 DIAGNOSIS — D649 Anemia, unspecified: Secondary | ICD-10-CM | POA: Diagnosis not present

## 2020-05-17 DIAGNOSIS — F419 Anxiety disorder, unspecified: Secondary | ICD-10-CM | POA: Diagnosis not present

## 2020-05-17 DIAGNOSIS — I110 Hypertensive heart disease with heart failure: Secondary | ICD-10-CM | POA: Diagnosis not present

## 2020-05-17 DIAGNOSIS — K219 Gastro-esophageal reflux disease without esophagitis: Secondary | ICD-10-CM | POA: Diagnosis not present

## 2020-05-17 DIAGNOSIS — E1122 Type 2 diabetes mellitus with diabetic chronic kidney disease: Secondary | ICD-10-CM | POA: Diagnosis not present

## 2020-05-18 DIAGNOSIS — F419 Anxiety disorder, unspecified: Secondary | ICD-10-CM | POA: Diagnosis not present

## 2020-05-18 DIAGNOSIS — N2581 Secondary hyperparathyroidism of renal origin: Secondary | ICD-10-CM | POA: Diagnosis not present

## 2020-05-18 DIAGNOSIS — I110 Hypertensive heart disease with heart failure: Secondary | ICD-10-CM | POA: Diagnosis not present

## 2020-05-18 DIAGNOSIS — Z992 Dependence on renal dialysis: Secondary | ICD-10-CM | POA: Diagnosis not present

## 2020-05-18 DIAGNOSIS — D649 Anemia, unspecified: Secondary | ICD-10-CM | POA: Diagnosis not present

## 2020-05-18 DIAGNOSIS — K219 Gastro-esophageal reflux disease without esophagitis: Secondary | ICD-10-CM | POA: Diagnosis not present

## 2020-05-18 DIAGNOSIS — E1122 Type 2 diabetes mellitus with diabetic chronic kidney disease: Secondary | ICD-10-CM | POA: Diagnosis not present

## 2020-05-18 DIAGNOSIS — N186 End stage renal disease: Secondary | ICD-10-CM | POA: Diagnosis not present

## 2020-05-18 DIAGNOSIS — Z23 Encounter for immunization: Secondary | ICD-10-CM | POA: Diagnosis not present

## 2020-05-18 DIAGNOSIS — D631 Anemia in chronic kidney disease: Secondary | ICD-10-CM | POA: Diagnosis not present

## 2020-05-18 DIAGNOSIS — D509 Iron deficiency anemia, unspecified: Secondary | ICD-10-CM | POA: Diagnosis not present

## 2020-05-21 DIAGNOSIS — N186 End stage renal disease: Secondary | ICD-10-CM | POA: Diagnosis not present

## 2020-05-21 DIAGNOSIS — Z89511 Acquired absence of right leg below knee: Secondary | ICD-10-CM | POA: Diagnosis not present

## 2020-05-21 DIAGNOSIS — D509 Iron deficiency anemia, unspecified: Secondary | ICD-10-CM | POA: Diagnosis not present

## 2020-05-21 DIAGNOSIS — D631 Anemia in chronic kidney disease: Secondary | ICD-10-CM | POA: Diagnosis not present

## 2020-05-21 DIAGNOSIS — F419 Anxiety disorder, unspecified: Secondary | ICD-10-CM | POA: Diagnosis not present

## 2020-05-21 DIAGNOSIS — I110 Hypertensive heart disease with heart failure: Secondary | ICD-10-CM | POA: Diagnosis not present

## 2020-05-21 DIAGNOSIS — E1129 Type 2 diabetes mellitus with other diabetic kidney complication: Secondary | ICD-10-CM | POA: Diagnosis not present

## 2020-05-21 DIAGNOSIS — Z89512 Acquired absence of left leg below knee: Secondary | ICD-10-CM | POA: Diagnosis not present

## 2020-05-21 DIAGNOSIS — N2581 Secondary hyperparathyroidism of renal origin: Secondary | ICD-10-CM | POA: Diagnosis not present

## 2020-05-21 DIAGNOSIS — D649 Anemia, unspecified: Secondary | ICD-10-CM | POA: Diagnosis not present

## 2020-05-21 DIAGNOSIS — Z853 Personal history of malignant neoplasm of breast: Secondary | ICD-10-CM | POA: Diagnosis not present

## 2020-05-21 DIAGNOSIS — E1122 Type 2 diabetes mellitus with diabetic chronic kidney disease: Secondary | ICD-10-CM | POA: Diagnosis not present

## 2020-05-21 DIAGNOSIS — Z992 Dependence on renal dialysis: Secondary | ICD-10-CM | POA: Diagnosis not present

## 2020-05-21 DIAGNOSIS — Z515 Encounter for palliative care: Secondary | ICD-10-CM | POA: Diagnosis not present

## 2020-05-21 DIAGNOSIS — K219 Gastro-esophageal reflux disease without esophagitis: Secondary | ICD-10-CM | POA: Diagnosis not present

## 2020-05-22 ENCOUNTER — Encounter: Payer: PRIVATE HEALTH INSURANCE | Admitting: Physical Therapy

## 2020-05-22 ENCOUNTER — Observation Stay (HOSPITAL_COMMUNITY): Payer: Medicare Other

## 2020-05-22 ENCOUNTER — Other Ambulatory Visit: Payer: Self-pay

## 2020-05-22 ENCOUNTER — Inpatient Hospital Stay (HOSPITAL_COMMUNITY)
Admission: EM | Admit: 2020-05-22 | Discharge: 2020-05-25 | DRG: 377 | Disposition: A | Discharge status: Home / Self Care | Payer: Medicare Other | Attending: Family Medicine | Admitting: Family Medicine

## 2020-05-22 DIAGNOSIS — R0902 Hypoxemia: Secondary | ICD-10-CM | POA: Diagnosis not present

## 2020-05-22 DIAGNOSIS — Z20822 Contact with and (suspected) exposure to covid-19: Secondary | ICD-10-CM | POA: Diagnosis present

## 2020-05-22 DIAGNOSIS — Z6823 Body mass index (BMI) 23.0-23.9, adult: Secondary | ICD-10-CM

## 2020-05-22 DIAGNOSIS — H543 Unqualified visual loss, both eyes: Secondary | ICD-10-CM | POA: Diagnosis present

## 2020-05-22 DIAGNOSIS — E1140 Type 2 diabetes mellitus with diabetic neuropathy, unspecified: Secondary | ICD-10-CM | POA: Diagnosis present

## 2020-05-22 DIAGNOSIS — K31811 Angiodysplasia of stomach and duodenum with bleeding: Secondary | ICD-10-CM | POA: Diagnosis not present

## 2020-05-22 DIAGNOSIS — H548 Legal blindness, as defined in USA: Secondary | ICD-10-CM | POA: Diagnosis present

## 2020-05-22 DIAGNOSIS — Z7401 Bed confinement status: Secondary | ICD-10-CM

## 2020-05-22 DIAGNOSIS — R578 Other shock: Secondary | ICD-10-CM | POA: Diagnosis present

## 2020-05-22 DIAGNOSIS — Z885 Allergy status to narcotic agent status: Secondary | ICD-10-CM

## 2020-05-22 DIAGNOSIS — I959 Hypotension, unspecified: Secondary | ICD-10-CM | POA: Diagnosis not present

## 2020-05-22 DIAGNOSIS — I110 Hypertensive heart disease with heart failure: Secondary | ICD-10-CM | POA: Diagnosis not present

## 2020-05-22 DIAGNOSIS — I701 Atherosclerosis of renal artery: Secondary | ICD-10-CM | POA: Diagnosis not present

## 2020-05-22 DIAGNOSIS — Z992 Dependence on renal dialysis: Secondary | ICD-10-CM

## 2020-05-22 DIAGNOSIS — I509 Heart failure, unspecified: Secondary | ICD-10-CM | POA: Diagnosis present

## 2020-05-22 DIAGNOSIS — Z83438 Family history of other disorder of lipoprotein metabolism and other lipidemia: Secondary | ICD-10-CM

## 2020-05-22 DIAGNOSIS — Z89611 Acquired absence of right leg above knee: Secondary | ICD-10-CM

## 2020-05-22 DIAGNOSIS — D62 Acute posthemorrhagic anemia: Secondary | ICD-10-CM | POA: Diagnosis not present

## 2020-05-22 DIAGNOSIS — Z91040 Latex allergy status: Secondary | ICD-10-CM

## 2020-05-22 DIAGNOSIS — K5730 Diverticulosis of large intestine without perforation or abscess without bleeding: Secondary | ICD-10-CM | POA: Diagnosis present

## 2020-05-22 DIAGNOSIS — Z794 Long term (current) use of insulin: Secondary | ICD-10-CM

## 2020-05-22 DIAGNOSIS — N2581 Secondary hyperparathyroidism of renal origin: Secondary | ICD-10-CM | POA: Diagnosis present

## 2020-05-22 DIAGNOSIS — K922 Gastrointestinal hemorrhage, unspecified: Secondary | ICD-10-CM | POA: Diagnosis not present

## 2020-05-22 DIAGNOSIS — E1122 Type 2 diabetes mellitus with diabetic chronic kidney disease: Secondary | ICD-10-CM | POA: Diagnosis not present

## 2020-05-22 DIAGNOSIS — F419 Anxiety disorder, unspecified: Secondary | ICD-10-CM | POA: Diagnosis not present

## 2020-05-22 DIAGNOSIS — N186 End stage renal disease: Secondary | ICD-10-CM | POA: Diagnosis present

## 2020-05-22 DIAGNOSIS — I132 Hypertensive heart and chronic kidney disease with heart failure and with stage 5 chronic kidney disease, or end stage renal disease: Secondary | ICD-10-CM | POA: Diagnosis present

## 2020-05-22 DIAGNOSIS — R58 Hemorrhage, not elsewhere classified: Secondary | ICD-10-CM | POA: Diagnosis not present

## 2020-05-22 DIAGNOSIS — Z79899 Other long term (current) drug therapy: Secondary | ICD-10-CM

## 2020-05-22 DIAGNOSIS — I708 Atherosclerosis of other arteries: Secondary | ICD-10-CM | POA: Diagnosis not present

## 2020-05-22 DIAGNOSIS — E8889 Other specified metabolic disorders: Secondary | ICD-10-CM | POA: Diagnosis present

## 2020-05-22 DIAGNOSIS — D649 Anemia, unspecified: Secondary | ICD-10-CM

## 2020-05-22 DIAGNOSIS — Z8719 Personal history of other diseases of the digestive system: Secondary | ICD-10-CM

## 2020-05-22 DIAGNOSIS — Z8371 Family history of colonic polyps: Secondary | ICD-10-CM

## 2020-05-22 DIAGNOSIS — D126 Benign neoplasm of colon, unspecified: Secondary | ICD-10-CM

## 2020-05-22 DIAGNOSIS — K219 Gastro-esophageal reflux disease without esophagitis: Secondary | ICD-10-CM | POA: Diagnosis present

## 2020-05-22 DIAGNOSIS — Z8249 Family history of ischemic heart disease and other diseases of the circulatory system: Secondary | ICD-10-CM

## 2020-05-22 DIAGNOSIS — D631 Anemia in chronic kidney disease: Secondary | ICD-10-CM | POA: Diagnosis present

## 2020-05-22 DIAGNOSIS — I10 Essential (primary) hypertension: Secondary | ICD-10-CM | POA: Diagnosis not present

## 2020-05-22 DIAGNOSIS — Z841 Family history of disorders of kidney and ureter: Secondary | ICD-10-CM

## 2020-05-22 DIAGNOSIS — Z888 Allergy status to other drugs, medicaments and biological substances status: Secondary | ICD-10-CM

## 2020-05-22 DIAGNOSIS — Z86010 Personal history of colonic polyps: Secondary | ICD-10-CM

## 2020-05-22 DIAGNOSIS — Z89612 Acquired absence of left leg above knee: Secondary | ICD-10-CM

## 2020-05-22 DIAGNOSIS — E669 Obesity, unspecified: Secondary | ICD-10-CM | POA: Diagnosis present

## 2020-05-22 DIAGNOSIS — Z833 Family history of diabetes mellitus: Secondary | ICD-10-CM

## 2020-05-22 DIAGNOSIS — K648 Other hemorrhoids: Secondary | ICD-10-CM | POA: Diagnosis present

## 2020-05-22 DIAGNOSIS — Z853 Personal history of malignant neoplasm of breast: Secondary | ICD-10-CM

## 2020-05-22 DIAGNOSIS — Z89512 Acquired absence of left leg below knee: Secondary | ICD-10-CM

## 2020-05-22 DIAGNOSIS — K769 Liver disease, unspecified: Secondary | ICD-10-CM | POA: Diagnosis present

## 2020-05-22 DIAGNOSIS — E1151 Type 2 diabetes mellitus with diabetic peripheral angiopathy without gangrene: Secondary | ICD-10-CM | POA: Diagnosis present

## 2020-05-22 DIAGNOSIS — R531 Weakness: Secondary | ICD-10-CM | POA: Diagnosis not present

## 2020-05-22 DIAGNOSIS — K429 Umbilical hernia without obstruction or gangrene: Secondary | ICD-10-CM | POA: Diagnosis not present

## 2020-05-22 DIAGNOSIS — K635 Polyp of colon: Secondary | ICD-10-CM | POA: Diagnosis present

## 2020-05-22 DIAGNOSIS — Z9001 Acquired absence of eye: Secondary | ICD-10-CM

## 2020-05-22 DIAGNOSIS — R710 Precipitous drop in hematocrit: Secondary | ICD-10-CM | POA: Diagnosis not present

## 2020-05-22 LAB — I-STAT CHEM 8, ED
BUN: 37 mg/dL — ABNORMAL HIGH (ref 8–23)
Calcium, Ion: 0.95 mmol/L — ABNORMAL LOW (ref 1.15–1.40)
Chloride: 100 mmol/L (ref 98–111)
Creatinine, Ser: 6.7 mg/dL — ABNORMAL HIGH (ref 0.44–1.00)
Glucose, Bld: 103 mg/dL — ABNORMAL HIGH (ref 70–99)
HCT: 18 % — ABNORMAL LOW (ref 36.0–46.0)
Hemoglobin: 6.1 g/dL — CL (ref 12.0–15.0)
Potassium: 4.5 mmol/L (ref 3.5–5.1)
Sodium: 139 mmol/L (ref 135–145)
TCO2: 28 mmol/L (ref 22–32)

## 2020-05-22 LAB — TROPONIN I (HIGH SENSITIVITY)
Troponin I (High Sensitivity): 27 ng/L — ABNORMAL HIGH (ref <18)
Troponin I (High Sensitivity): 19 ng/L — ABNORMAL HIGH (ref <18)

## 2020-05-22 LAB — CBC WITH DIFFERENTIAL/PLATELET
Abs Immature Granulocytes: 0.04 10*3/uL (ref 0.00–0.07)
Basophils Absolute: 0 10*3/uL (ref 0.0–0.1)
Basophils Relative: 0 %
Eosinophils Absolute: 0.1 10*3/uL (ref 0.0–0.5)
Eosinophils Relative: 2 %
HCT: 17.7 % — ABNORMAL LOW (ref 36.0–46.0)
Hemoglobin: 5.1 g/dL — CL (ref 12.0–15.0)
Immature Granulocytes: 0 %
Lymphocytes Relative: 14 %
Lymphs Abs: 1.3 10*3/uL (ref 0.7–4.0)
MCH: 32.1 pg (ref 26.0–34.0)
MCHC: 28.8 g/dL — ABNORMAL LOW (ref 30.0–36.0)
MCV: 111.3 fL — ABNORMAL HIGH (ref 80.0–100.0)
Monocytes Absolute: 0.9 10*3/uL (ref 0.1–1.0)
Monocytes Relative: 10 %
Neutro Abs: 6.5 10*3/uL (ref 1.7–7.7)
Neutrophils Relative %: 74 %
Platelets: 190 10*3/uL (ref 150–400)
RBC: 1.59 MIL/uL — ABNORMAL LOW (ref 3.87–5.11)
RDW: 24.9 % — ABNORMAL HIGH (ref 11.5–15.5)
WBC: 8.9 10*3/uL (ref 4.0–10.5)
nRBC: 0 % (ref 0.0–0.2)

## 2020-05-22 LAB — GLUCOSE, CAPILLARY: Glucose-Capillary: 154 mg/dL — ABNORMAL HIGH (ref 70–99)

## 2020-05-22 LAB — PROTIME-INR
INR: 1.5 — ABNORMAL HIGH (ref 0.8–1.2)
Prothrombin Time: 17.5 seconds — ABNORMAL HIGH (ref 11.4–15.2)

## 2020-05-22 LAB — CBC
HCT: 24.1 % — ABNORMAL LOW (ref 36.0–46.0)
Hemoglobin: 7.6 g/dL — ABNORMAL LOW (ref 12.0–15.0)
MCH: 31 pg (ref 26.0–34.0)
MCHC: 31.5 g/dL (ref 30.0–36.0)
MCV: 98.4 fL (ref 80.0–100.0)
Platelets: 160 10*3/uL (ref 150–400)
RBC: 2.45 MIL/uL — ABNORMAL LOW (ref 3.87–5.11)
RDW: 22.9 % — ABNORMAL HIGH (ref 11.5–15.5)
WBC: 7.1 10*3/uL (ref 4.0–10.5)
nRBC: 0 % (ref 0.0–0.2)

## 2020-05-22 LAB — RETICULOCYTES
Immature Retic Fract: 35.4 % — ABNORMAL HIGH (ref 2.3–15.9)
RBC.: 1.62 MIL/uL — ABNORMAL LOW (ref 3.87–5.11)
Retic Count, Absolute: 157.5 10*3/uL (ref 19.0–186.0)
Retic Ct Pct: 9.7 % — ABNORMAL HIGH (ref 0.4–3.1)

## 2020-05-22 LAB — COMPREHENSIVE METABOLIC PANEL
ALT: 20 U/L (ref 0–44)
AST: 53 U/L — ABNORMAL HIGH (ref 15–41)
Albumin: 2.7 g/dL — ABNORMAL LOW (ref 3.5–5.0)
Alkaline Phosphatase: 54 U/L (ref 38–126)
Anion gap: 14 (ref 5–15)
BUN: 30 mg/dL — ABNORMAL HIGH (ref 8–23)
CO2: 25 mmol/L (ref 22–32)
Calcium: 8 mg/dL — ABNORMAL LOW (ref 8.9–10.3)
Chloride: 100 mmol/L (ref 98–111)
Creatinine, Ser: 6.4 mg/dL — ABNORMAL HIGH (ref 0.44–1.00)
GFR, Estimated: 7 mL/min — ABNORMAL LOW (ref >60)
Glucose, Bld: 113 mg/dL — ABNORMAL HIGH (ref 70–99)
Potassium: 5.1 mmol/L (ref 3.5–5.1)
Sodium: 139 mmol/L (ref 135–145)
Total Bilirubin: 1 mg/dL (ref 0.3–1.2)
Total Protein: 5.5 g/dL — ABNORMAL LOW (ref 6.5–8.1)

## 2020-05-22 LAB — PREPARE RBC (CROSSMATCH)

## 2020-05-22 LAB — RESP PANEL BY RT PCR (RSV, FLU A&B, COVID)
Influenza A by PCR: NEGATIVE
Influenza B by PCR: NEGATIVE
Respiratory Syncytial Virus by PCR: NEGATIVE
SARS Coronavirus 2 by RT PCR: NEGATIVE

## 2020-05-22 LAB — HEMOGLOBIN A1C
Hgb A1c MFr Bld: 5.2 % (ref 4.8–5.6)
Mean Plasma Glucose: 102.54 mg/dL

## 2020-05-22 LAB — POC OCCULT BLOOD, ED: Fecal Occult Bld: POSITIVE — AB

## 2020-05-22 MED ORDER — PANTOPRAZOLE SODIUM 40 MG IV SOLR: 40.0000 mg | Freq: Once | INTRAVENOUS | Status: DC

## 2020-05-22 MED ORDER — SODIUM CHLORIDE 0.9% IV SOLUTION: Freq: Once | INTRAVENOUS | Status: DC

## 2020-05-22 MED ORDER — IOHEXOL 350 MG/ML SOLN
100.0000 mL | Freq: Once | INTRAVENOUS | Status: AC | PRN
  Administered 2020-05-22: 100 mL via INTRAVENOUS

## 2020-05-22 MED ORDER — SODIUM CHLORIDE 0.9 % IV SOLN
80.0000 mg | Freq: Once | INTRAVENOUS | Status: AC
  Administered 2020-05-22: 80 mg via INTRAVENOUS
  Filled 2020-05-22: qty 80

## 2020-05-22 MED ORDER — SODIUM CHLORIDE 0.9% IV SOLUTION: Freq: Once | INTRAVENOUS | Status: AC

## 2020-05-22 MED ORDER — SODIUM CHLORIDE 0.9 % IV SOLN
10.0000 mL/h | Freq: Once | INTRAVENOUS | Status: AC
  Administered 2020-05-22: 10 mL/h via INTRAVENOUS

## 2020-05-22 MED ORDER — SODIUM CHLORIDE 0.9 % IV SOLN
8.0000 mg/h | INTRAVENOUS | Status: DC
  Administered 2020-05-22 – 2020-05-23 (×3): 8 mg/h via INTRAVENOUS
  Filled 2020-05-22 (×4): qty 80

## 2020-05-22 MED ORDER — INSULIN ASPART 100 UNIT/ML ~~LOC~~ SOLN: 0.0000 [IU] | Freq: Three times a day (TID) | SUBCUTANEOUS | Status: DC

## 2020-05-22 NOTE — Consult Note (Signed)
NAME:  [name], MRN:  [id number], DOB:  1950-09-19, LOS: 0 ADMISSION DATE:  05/22/2020, CONSULTATION DATE:  11/2 REFERRING MD:  Dr. Maryan Rued, CHIEF COMPLAINT:  GIB, hemorrhagic shock   Brief History   69 year old female with ESRD presented 11/2 with complaints of melena. Hypotensive to SBP 60s in the ED. PCCM asked to evaluate.   History of present illness   69 year old female with PMH as below, which is significant for ESRD on HD, DM, bilateral AKA, and GIB in the setting of known AVM and plavix use. 11/1 she had some complaints of SOB and sharp chest pains intermittently with no association with her HD treatment that day. Then 11/2 she developed large volume melanic stools as witnessed by her sister who lives with her. This prompted EMS call and transport to the ED. Immediately upon arrival to the ED she was found to be hypotensive with SBP in the 60s. Hemoglobin 5.1. 2 units PRBC ordered. After one unit she remained hypotensive and PCCM was called for further evaluation.   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   Significant Hospital Events   11/2 admit  Consults:  GI PCCM  Procedures:    Significant Diagnostic Tests:    Micro Data:    Antimicrobials:     Interim history/subjective:    Objective   Blood pressure (!) 113/43, pulse 76, temperature 98.8 F (37.1 C),  temperature source Oral, resp. rate 15, height 5\' 5"  (1.651 m), weight 63 kg, SpO2 100 %.        Intake/Output Summary (Last 24 hours) at 05/22/2020 1628 Last data filed at 05/22/2020 1445 Gross per 24 hour  Intake 315 ml  Output --  Net 315 ml   Filed Weights   05/22/20 1300  Weight: 63 kg    Examination: General: obese adult female HENT: /AT, PERRL, no JVD Lungs: Clear bilateral breath sounds.  Cardiovascular: RRR, 3/6 systolic murmur.  Abdomen: Soft, diffusely tender. Non-distended Extremities: No acute deformity. Bilateral AKA remote.  Neuro: Alert, oriented, non-focal.   Resolved Hospital Problem list     Assessment & Plan:   Hypotension related to GI hemorrhage: patient has a knw - s/p one unit of emergency release PRBC, second unit transfusing now with SBP 120.  - Schedule for a third unit after type and screen done.   - Protonix - GI consult pending - Hemodynamics have improved and GI has no plans for urgent/bedside endoscopy. Patient stable for admission to progressive care. PCCM will be available as needed.    Labs   CBC: Recent Labs  Lab 05/22/20 1323 05/22/20 1414  WBC 8.9  --   NEUTROABS 6.5  --   HGB 5.1* 6.1*  HCT 17.7* 18.0*  MCV 111.3*  --   PLT 190  --     Basic Metabolic Panel: Recent Labs  Lab 05/22/20 1323 05/22/20 1414  NA 139 139  K 5.1 4.5  CL 100 100  CO2 25  --   GLUCOSE 113* 103*  BUN 30* 37*  CREATININE 6.40* 6.70*  CALCIUM 8.0*  --    GFR: Estimated Creatinine Clearance: 7.3 mL/min (A) (by C-G formula based on SCr of 6.7 mg/dL (H)). Recent Labs  Lab 05/22/20 1323  WBC 8.9    Liver Function Tests: Recent Labs  Lab 05/22/20 1323  AST 53*  ALT 20  ALKPHOS 54  BILITOT 1.0  PROT 5.5*  ALBUMIN 2.7*   No results for input(s): LIPASE, AMYLASE in the last 168 hours. No results for input(s): AMMONIA in the last 168 hours.  ABG    Component Value Date/Time   TCO2 28 05/22/2020 1414     Coagulation  Profile: No results for input(s): INR, PROTIME in the last 168 hours.  Cardiac Enzymes: No results for input(s): CKTOTAL, CKMB, CKMBINDEX, TROPONINI in the last 168 hours.  HbA1C: Hgb A1c MFr Bld  Date/Time Value Ref Range Status  10/19/2019 10:25 PM 6.0 (H) 4.8 - 5.6 % Final    Comment:    (NOTE) Pre diabetes:          5.7%-6.4% Diabetes:              >6.4% Glycemic control for   <7.0% adults with diabetes   08/30/2019 06:03 AM 7.0 (H) 4.8 - 5.6 % Final    Comment:    (NOTE)         Prediabetes: 5.7 - 6.4         Diabetes: >6.4         Glycemic control for adults with diabetes: <7.0     CBG: No results for input(s): GLUCAP in the last 168 hours.  Review of Systems:   Bolds are positive  Constitutional: weight loss, gain, night sweats, Fevers, chills, fatigue .  HEENT: headaches, Sore throat, sneezing, nasal congestion, post nasal drip, Difficulty swallowing, Tooth/dental problems, visual complaints visual changes, ear ache CV:  chest pain, radiates:,Orthopnea, PND, swelling in lower extremities, dizziness, palpitations, syncope.  GI  heartburn, indigestion, abdominal pain, nausea, vomiting, diarrhea, change in bowel habits, loss of appetite, bloody stools.  Resp: cough, productive: , hemoptysis, dyspnea, chest pain, pleuritic.  Skin: rash or itching or icterus GU: dysuria, change in color of urine, urgency or frequency. flank pain, hematuria  MS: joint pain or swelling. decreased range of motion  Psych: change in mood or affect. depression or anxiety.  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   Surgical History    Past Surgical History:  Procedure Laterality Date  . A/V FISTULAGRAM N/A 09/21/2018   Procedure: A/V FISTULAGRAM - Right Upper;  Surgeon: [name], MD;  Location: [place] CV LAB;  Service: Cardiovascular;  Laterality: N/A;  . ABDOMINAL AORTOGRAM N/A 11/30/2018   Procedure: ABDOMINAL AORTOGRAM;  Surgeon: [name], MD;  Location: [place] [facility];  Service: Cardiovascular;  Laterality: N/A;  . ABDOMINAL AORTOGRAM W/LOWER EXTREMITY Left 07/29/2019   Procedure: ABDOMINAL AORTOGRAM W/LOWER EXTREMITY;  Surgeon: [name], MD;  Location: [place] CV LAB;  Service: Cardiovascular;  Laterality: Left;  . ABDOMINAL HYSTERECTOMY     partial  . AMPUTATION Left 08/30/2019   Procedure: AMPUTATION BELOW KNEE LEFT;  Surgeon: [name], MD;  Location: [facility] OR;  Service: Vascular;  Laterality: Left;  . AMPUTATION Left 10/21/2019   Procedure: Amputation Above Knee;  Surgeon: Rosetta Posner, MD;  Location: [place];  Service: Vascular;  Laterality: Left;  . AMPUTATION Right 11/17/2019   Procedure: AMPUTATION ABOVE KNEE, right leg;  Surgeon: Rosetta Posner, MD;  Location: [place];  Service: Vascular;  Laterality: Right;  . AV FISTULA PLACEMENT Right 06/06/2013   Procedure: ARTERIOVENOUS (AV) FISTULA CREATION-RIGHT BRACHIAL CEPHALIC;  Surgeon: Conrad Maryville, MD;  Location: [place];  Service: Vascular;  Laterality: Right;  . BREAST BIOPSY Left   . BREAST LUMPECTOMY Left    "and took out some lymph nodes" (06/15/2013)  . CARDIAC CATHETERIZATION     04/02/15 [facility]): no angiographic CAD, LVEF 40% with global hypokinesis (LHC done for + stress echo, EF 45% 11/28/14)  . CARPAL TUNNEL RELEASE  Left 11/26/2017   Procedure: LEFT CARPAL TUNNEL RELEASE;  Surgeon: [name], MD;  Location: [place];  Service: Orthopedics;  Laterality: Left;  . CATARACT EXTRACTION W/ ANTERIOR VITRECTOMY Bilateral   . CESAREAN SECTION  1980  . COLONOSCOPY    . ENTEROSCOPY N/A 12/18/2019   Procedure: ENTEROSCOPY;  Surgeon: Mansouraty, Telford Nab., MD;  Location: [place];  Service: Gastroenterology;  Laterality: N/A;  . ESOPHAGOGASTRODUODENOSCOPY N/A 07/08/2017   Procedure: ESOPHAGOGASTRODUODENOSCOPY (EGD);  Surgeon: [name], MD;  Location: [facility] ENDOSCOPY;  Service: Endoscopy;  Laterality: N/A;  . ESOPHAGOGASTRODUODENOSCOPY (EGD) WITH PROPOFOL N/A 02/07/2018   Procedure: ESOPHAGOGASTRODUODENOSCOPY (EGD) WITH PROPOFOL;  Surgeon: [name], MD;  Location: [facility] ENDOSCOPY;  Service: Gastroenterology;  Laterality: N/A;  APC and clips placed  . EYE SURGERY Bilateral    laser surgery  . GIVENS CAPSULE STUDY N/A 12/19/2019   Procedure: GIVENS CAPSULE STUDY;  Surgeon: Irving Copas., MD;  Location: [place];  Service: Gastroenterology;  Laterality: N/A;  . HOT HEMOSTASIS N/A 12/18/2019   Procedure: HOT HEMOSTASIS (ARGON PLASMA COAGULATION/BICAP);  Surgeon: Irving Copas., MD;  Location: [place];  Service: Gastroenterology;  Laterality: N/A;  . LOWER EXTREMITY ANGIOGRAPHY Bilateral 11/30/2018   Procedure: LOWER EXTREMITY ANGIOGRAPHY;  Surgeon: [name], MD;  Location: [place] CV LAB;  Service: Cardiovascular;  Laterality: Bilateral;  . PARS PLANA VITRECTOMY Right 05/05/2017   Procedure: PARS PLANA VITRECTOMY WITH 25 GAUGE; PARTIAL REMOVAL OF OIL; INFERIOR PERIPHERAL IRIDECTOMY, REFORM ANTERIOR CHAMBER RIGHT EYE;  Surgeon: [name], MD;  Location: [place];  Service: Ophthalmology;  Laterality: Right;  . PERIPHERAL VASCULAR BALLOON ANGIOPLASTY Right 09/21/2018   Procedure: PERIPHERAL VASCULAR BALLOON ANGIOPLASTY;  Surgeon: [name], MD;  Location: [place] [facility] LAB;  Service: Cardiovascular;  Laterality: Right;  AV fistula  . PERIPHERAL VASCULAR BALLOON ANGIOPLASTY Left 11/30/2018   Procedure: PERIPHERAL VASCULAR BALLOON ANGIOPLASTY;  Surgeon: [name], MD;  Location: [place] CV LAB;  Service: Cardiovascular;  Laterality: Left;  Left Anterior Tibial Artery  . REFRACTIVE SURGERY Bilateral   . REMOVAL OF A DIALYSIS CATHETER Right 06/06/2013   Procedure: REMOVAL OF RIGHT MEDIPORT;  Surgeon: Conrad Leslie, MD;  Location: [place];  Service: Vascular;  Laterality: Right;  . SUBMUCOSAL TATTOO INJECTION  12/18/2019   Procedure: SUBMUCOSAL TATTOO INJECTION;  Surgeon: Irving Copas., MD;  Location:  [facility] ENDOSCOPY;  Service: Gastroenterology;;  . TONSILLECTOMY    . UPPER GASTROINTESTINAL ENDOSCOPY    . WOUND DEBRIDEMENT Left 09/30/2019   Procedure: IRRIGATION AND DEBRIDEMENT WOUND OF LEFT BELOW KNEE AMPUTATION;  Surgeon: [name], MD;  Location: [facility] OR;  Service: Vascular;  Laterality: Left;     Social History   reports that she has never smoked. She has never used smokeless tobacco. She reports that she does not drink alcohol and does not use drugs.   Family History   Her family history includes Diabetes in her brother, mother, and sister; Heart attack in her brother; Hyperlipidemia in her mother; Hypertension in her brother, father, and mother; Kidney disease in her brother. There is no history of Colon cancer, Colon polyps, Esophageal cancer, Gallbladder disease, Rectal cancer, or Stomach cancer.   Allergies Allergies  Allergen Reactions  . Tape Itching and Rash    51M Transpore adhesive tape. Medical tape pulls off the skin!! PAPER TAPE ONLY, PLEASE  . Latex Hives  . Oxycodone Other (See Comments)    Hallucinations   . Tramadol Other (See Comments)    Hallucinations with a full tablet  . Morphine And Related Other (See Comments)    Hallucinations   . Vicodin [Hydrocodone-Acetaminophen] Other (See Comments)     Hallucinations      Home Medications  Prior to Admission medications   Medication Sig Start Date End Date Taking? Authorizing Provider  albuterol (PROVENTIL) (2.5 MG/51ML) 0.083% nebulizer solution Take 3 mLs (2.5 mg total) by nebulization every 6 (six) hours as needed for wheezing or shortness of breath. 10/30/16   Theodis Blaze, MD  albuterol (VENTOLIN HFA) 108 (90 Base) MCG/ACT inhaler Inhale 2 puffs into the lungs every 6 (six) hours as needed for wheezing or shortness of breath.     [provider]  Alcohol Swabs (B-D SINGLE USE SWABS REGULAR) PADS in the morning, at noon, and at bedtime.  03/08/19   [provider]  aspirin EC 81 MG tablet Take 81 mg by mouth daily.    [provider]  atropine 1 % ophthalmic solution Place 1 drop into the right eye 2 (two) times daily. 07/04/17   [provider]  BD PEN NEEDLE NANO 2ND GEN 32G X 4 MM MISC in the morning, at noon, and at bedtime.  03/03/19   [provider]  carvedilol (COREG) 25 MG tablet Take 0.5 tablets (12.5 mg total) by mouth 2 (two) times daily. 10/24/19 12/16/19  Alma Friendly, MD  carvedilol (COREG) 25 MG tablet Take 12.5 mg by mouth 2 (two) times daily with a meal.    [provider]  carvedilol (COREG) 6.25 MG tablet Take 1 tablet (6.25 mg total) by mouth 2 (two) times daily. 12/20/19 01/19/20  Guilford Shi, MD  darbepoetin (ARANESP) 200 MCG/0.4ML SOLN injection Inject 0.4 mLs (200 mcg total) into the vein every Wednesday with hemodialysis. 06/22/13   Geradine Girt, DO  diclofenac sodium (VOLTAREN) 1 % GEL Apply 2 g topically 4 (four) times daily as needed (pain).  04/28/19   [provider]  diphenhydrAMINE (BENADRYL) 25 MG tablet Take 25 mg by mouth in the morning, at noon, and at bedtime.     [provider]  docusate sodium (COLACE) 100 MG capsule Take 100 mg by mouth 2 (two) times daily with a meal.     [provider]  doxercalciferol  (HECTOROL) 4 MCG/2ML injection Inject 0.5 mLs (1 mcg total) into the  vein every Monday, Wednesday, and Friday with hemodialysis. 06/18/13   Geradine Girt, DO  DULoxetine (CYMBALTA) 30 MG capsule Take 30 mg by mouth daily.    [provider]  fentaNYL (DURAGESIC) 12 MCG/HR 1 patch every 3 (three) days. 01/04/20   [provider]  fluticasone (FLONASE) 50 MCG/ACT nasal spray Place 2 sprays into both nostrils daily as needed for allergies.  02/07/19   [provider]  gabapentin (NEURONTIN) 100 MG capsule Take 100 mg by mouth 2 (two) times daily.    [provider]  insulin aspart (NOVOLOG FLEXPEN) 100 UNIT/ML FlexPen Inject 2-10 Units into the skin 2 (two) times daily with a meal.     [provider]  lanthanum (FOSRENOL) 1000 MG chewable tablet Chew 2,000-4,000 mg by mouth See admin instructions. Chew 4,000 mg by mouth with each meal and 2,000 mg with each snack    [provider]  multivitamin (RENA-VIT) TABS tablet Take 1 tablet by mouth daily. 12/20/19   [provider]  omeprazole (PRILOSEC) 40 MG capsule Take 1 capsule (40 mg total) by mouth daily. Patient taking differently: Take 40 mg by mouth 2 (two) times daily.  02/09/18   Cherene Altes, MD  [facility] VERIO test strip 1 each by Other route 3 (three) times daily.  12/15/17   [provider]  prednisoLONE acetate (PRED FORTE) 1 % ophthalmic suspension Place 1 drop into the right eye 2 (two) times daily.  04/27/17   [provider]  SENNA LAXATIVE 8.6 MG tablet SMARTSIG:1-2 Tablet(s) By Mouth PRN 01/04/20   [provider]  traMADol (ULTRAM) 50 MG tablet Take 1 tablet (50 mg total) by mouth every 6 (six) hours as needed. 12/12/19   Dagoberto Ligas, PA-C      Georgann Housekeeper, AGACNP-BC [place] for personal pager PCCM on call pager [phone]  05/22/2020 4:28 PM

## 2020-05-22 NOTE — ED Provider Notes (Signed)
Care the patient received from green PA-C.  Please see his note for full HPI  In short, 69 year old female with prior history of GI bleeds who presents with symptomatic anemia, on presentation hemoglobin was 6.1.  Patient also is a dialysis patient, going Monday Wednesday Friday.  She has had blood in her stool in the past and has required transfusions with heme positive stools here.  She arrived hypotensive with a blood pressure of 95/33.  Previous provider had spoken with [facility] who the patient is followed by.  Patient was also seen and evaluated by critical care.  They feel the patient is appropriate for stepdown unit.  Consulted the hospitalist Dr. Tobie Poet who will admit the patient for further evaluation and treatment.  Patient currently receiving second unit of blood, BP is improving. Physical Exam  BP (!) 113/43   Pulse 76   Temp 98.8 F (37.1 C) (Oral)   Resp 15   Ht 5\' 5"  (1.651 m)   Wt 63 kg   LMP  (LMP Unknown)   SpO2 100%   BMI 23.11 kg/m   Physical Exam Vitals and nursing note reviewed.  Constitutional:      General: She is not in acute distress.    Appearance: She is well-developed. She is ill-appearing.  HENT:     Head: Normocephalic and atraumatic.     Mouth/Throat:     Mouth: Mucous membranes are moist.     Pharynx: Oropharynx is clear.  Eyes:     Conjunctiva/sclera: Conjunctivae normal.     Pupils: Pupils are equal, round, and reactive to light.  Cardiovascular:     Rate and Rhythm: Normal rate and regular rhythm.     Pulses: Normal pulses.     Heart sounds: No murmur heard.   Pulmonary:     Effort: Pulmonary effort is normal. No respiratory distress.     Breath sounds: Normal breath sounds.  Abdominal:     General: Abdomen is flat.     Palpations: Abdomen is soft.     Tenderness: There is abdominal tenderness (generalized tenderness).  Musculoskeletal:        General: No deformity or signs of injury. Normal range of motion.     Cervical back: Rigidity  present.     Right lower leg: No edema.     Left lower leg: No edema.  Skin:    General: Skin is warm and dry.     Findings: No erythema.  Neurological:     General: No focal deficit present.     Mental Status: She is alert and oriented to person, place, and time.  Psychiatric:        Mood and Affect: Mood normal.        Behavior: Behavior normal.     ED Course/Procedures   Clinical Course as of May 23 1615  Tue May 22, 2020  1510 Hemoglobin(!!): 5.1 [GG]  1511 Fecal Occult Blood, POC(!): POSITIVE [GG]  1515 Spoke with critical care who will evaluate patient at bedside.    [GG]  [place] with Nancy Marus, PA at [facility] who will see patient.   [GG]    Clinical Course User Index [GG] Corena Herter, PA-C   Results for orders placed or performed during the hospital encounter of 05/22/20  Reticulocytes  Result Value Ref Range   Retic Ct Pct 9.7 (H) 0.4 - 3.1 %   RBC. 1.62 (L) 3.87 - 5.11 MIL/uL   Retic Count, Absolute 157.5 19.0 -  186.0 K/uL   Immature Retic Fract 35.4 (H) 2.3 - 15.9 %  CBC with Differential  Result Value Ref Range   WBC 8.9 4.0 - 10.5 K/uL   RBC 1.59 (L) 3.87 - 5.11 MIL/uL   Hemoglobin 5.1 (LL) 12.0 - 15.0 g/dL   HCT 17.7 (L) 36 - 46 %   MCV 111.3 (H) 80.0 - 100.0 fL   MCH 32.1 26.0 - 34.0 pg   MCHC 28.8 (L) 30.0 - 36.0 g/dL   RDW 24.9 (H) 11.5 - 15.5 %   Platelets 190 150 - 400 K/uL   nRBC 0.0 0.0 - 0.2 %   Neutrophils Relative % 74 %   Neutro Abs 6.5 1.7 - 7.7 K/uL   Lymphocytes Relative 14 %   Lymphs Abs 1.3 0.7 - 4.0 K/uL   Monocytes Relative 10 %   Monocytes Absolute 0.9 0.1 - 1.0 K/uL   Eosinophils Relative 2 %   Eosinophils Absolute 0.1 0.0 - 0.5 K/uL   Basophils Relative 0 %   Basophils Absolute 0.0 0.0 - 0.1 K/uL   Immature Granulocytes 0 %   Abs Immature Granulocytes 0.04 0.00 - 0.07 K/uL   Polychromasia PRESENT    Ovalocytes PRESENT   Comprehensive metabolic panel  Result Value Ref Range   Sodium 139 135 - 145 mmol/L    Potassium 5.1 3.5 - 5.1 mmol/L   Chloride 100 98 - 111 mmol/L   CO2 25 22 - 32 mmol/L   Glucose, Bld 113 (H) 70 - 99 mg/dL   BUN 30 (H) 8 - 23 mg/dL   Creatinine, Ser 6.40 (H) 0.44 - 1.00 mg/dL   Calcium 8.0 (L) 8.9 - 10.3 mg/dL   Total Protein 5.5 (L) 6.5 - 8.1 g/dL   Albumin 2.7 (L) 3.5 - 5.0 g/dL   AST 53 (H) 15 - 41 U/L   ALT 20 0 - 44 U/L   Alkaline Phosphatase 54 38 - 126 U/L   Total Bilirubin 1.0 0.3 - 1.2 mg/dL   GFR, Estimated 7 (L) >60 mL/min   Anion gap 14 5 - 15  POC occult blood, ED  Result Value Ref Range   Fecal Occult Bld POSITIVE (A) NEGATIVE  I-stat chem 8, ED (not at [facility] or [facility])  Result Value Ref Range   Sodium 139 135 - 145 mmol/L   Potassium 4.5 3.5 - 5.1 mmol/L   Chloride 100 98 - 111 mmol/L   BUN 37 (H) 8 - 23 mg/dL   Creatinine, Ser 6.70 (H) 0.44 - 1.00 mg/dL   Glucose, Bld 103 (H) 70 - 99 mg/dL   Calcium, Ion 0.95 (L) 1.15 - 1.40 mmol/L   TCO2 28 22 - 32 mmol/L   Hemoglobin 6.1 (LL) 12.0 - 15.0 g/dL   HCT 18.0 (L) 36 - 46 %   Comment NOTIFIED PHYSICIAN   Type and screen [place]  Result Value Ref Range   ABO/RH(D) A POS    Antibody Screen NEG    Sample Expiration 05/25/2020,2359    Unit Number [id number]    Blood Component Type RED CELLS,LR    Unit division 00    Status of Unit ISSUED    Unit tag comment EMERGENCY RELEASE    Transfusion Status OK TO TRANSFUSE    Crossmatch Result COMPATIBLE    Unit Number [id number]    Blood Component Type RED CELLS,LR    Unit division 00    Status of Unit REL FROM [facility]    Unit  tag comment EMERGENCY RELEASE    Transfusion Status OK TO TRANSFUSE    Crossmatch Result NOT NEEDED    Unit Number [id number]    Blood Component Type RED CELLS,LR    Unit division 00    Status of Unit ISSUED    Transfusion Status OK TO TRANSFUSE    Crossmatch Result      Compatible Performed at [facility], [place] [address] [place], [place] [address]   Prepare RBC (crossmatch)  Result  Value Ref Range   Order Confirmation      ORDER PROCESSED BY BLOOD BANK Performed at [facility], [place] [address] [place], [place] [address]   Prepare RBC (crossmatch)  Result Value Ref Range   Order Confirmation      ORDER PROCESSED BY BLOOD BANK Performed at [facility], [place] [address], [place], [place] [address]   BPAM [facility]  Result Value Ref Range   ISSUE DATE / TIME 768115726203    Blood Product Unit Number [id number]    PRODUCT CODE [id number]    Unit Type and Rh 5100    Blood Product Expiration Date 202111292359    ISSUE DATE / TIME 468032122482    Blood Product Unit Number [id number]    PRODUCT CODE [id number]    Unit Type and Rh 5100    Blood Product Expiration Date 202111292359    ISSUE DATE / TIME [id number]    Blood Product Unit Number [id number]    PRODUCT CODE [id number]    Unit Type and Rh 6200    Blood Product Expiration Date 202111222359   Troponin I (High Sensitivity)  Result Value Ref Range   Troponin I (High Sensitivity) 27 (H) <18 ng/L   No results found.  Procedures  MDM        Garald Balding, PA-C 05/22/20 1616    Blanchie Dessert, MD 05/22/20 2144

## 2020-05-22 NOTE — ED Triage Notes (Signed)
Pt arrived via EMS from home called out for blood in stool that is black and tarry in nature occurring for the past several days, worsening this morning. Per EMS dialysis reports a drop in pts hgb from 9 to 8. Pt goes to dialysis MWF. Pt was given 670ml of NS by EMS. BGL was 199 per EMS. Pt states she has had blood in her stool like this  in the past and has had to be transfused. Pt reports abdominal pain and chest pain for the past several days.

## 2020-05-22 NOTE — H&P (Signed)
History and Physical   [name] ZDG:[id number] DOB: 1951-03-25 DOA: 05/22/2020  PCP: Prince Solian, MD  Patient coming from: Home  I have personally briefly reviewed patient's old medical records in [place].  Chief Concern: syncope  HPI: [name] is a 69 y.o. 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  Presented to the ED for chief concern of syncope after bathroom use.  She endorsed that she has been experiencing weakness during HD session on Friday and Monday. Her h/h at HD on Friday was 9 and 8 on Monday. She states that she asks to get her H/H checked at each HD session.   She reports nausea and vomiting on 05/21/20 and her sister reports this was clear. She endorsed generalized abdominal pain that started on 05/21/20 starting in her LLQ and radiating throughout her abdomen and to her back.   Passing out after her 3-4th bowel movement this AM. She reports her sister did not endorsed seizure activity or incontinence. She does not know how long she was out.  Review of system was negative for fever, chills, cough, chest pain.  Review of system was positive for headache, abdominal pain, loose bowel movements on day of presentation x3-4.  Social history: She lives at home with her sister who is currently her caregiver, denies tobacco, EtOH, recreational drug use.  ED Course: Discussed with ED provider. Patient initially presented with acute hemorrhagic shock. ICU was consulted and with patient condition improving recommends progressive admission.  Review of Systems: As per HPI otherwise 10 point review of systems negative.  Past Medical History:  Diagnosis Date  . Anemia   . Anxiety   . Arthritis    "joints"  (06/15/2013)  . Asthma   . 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 Blood transfusion without reported diagnosis   . Breast cancer ([place])    left  . CHF (congestive heart failure) ([place])   . 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 Esophageal ulcer with bleeding   . ESRD (end stage renal disease) ([place])    "suppose to start dialysis today" (06/15/2013)  . Family history of adverse reaction to anesthesia    It took a while for pt sister to wake from anesthesia  . GERD (gastroesophageal reflux disease)   . Glaucoma    blind in both eyes  . Heart murmur    Mild AS, moderate MR, moderate TR 10/30/16 echo  . Hx of adenomatous colonic polyps 04/07/2018  . Hypertension   . Myalgia 12/31/2011  . Neuropathy 12/31/2011  . PAD (peripheral artery disease) (HCC)    nonviable tissue left lower extremity  . Shortness of breath    "when she doesn't go to dialysis"  . Type II diabetes mellitus ([place])    Type II   Past Surgical History:  Procedure Laterality Date  . A/V FISTULAGRAM N/A 09/21/2018   Procedure: A/V FISTULAGRAM - Right Upper;  Surgeon: [name], MD;  Location: [place] [facility];  Service: Cardiovascular;  Laterality: N/A;  . ABDOMINAL AORTOGRAM N/A 11/30/2018   Procedure: ABDOMINAL AORTOGRAM;  Surgeon: [name], MD;  Location: [place] [facility];  Service: Cardiovascular;  Laterality: N/A;  . ABDOMINAL AORTOGRAM W/LOWER EXTREMITY Left 07/29/2019   Procedure: ABDOMINAL  AORTOGRAM W/LOWER EXTREMITY;  Surgeon: [name], MD;  Location: [place] [facility];  Service: Cardiovascular;  Laterality: Left;  . ABDOMINAL HYSTERECTOMY     partial  . AMPUTATION Left 08/30/2019   Procedure: AMPUTATION BELOW KNEE LEFT;  Surgeon: [name], MD;  Location: [facility] OR;  Service: Vascular;  Laterality: Left;  . AMPUTATION Left 10/21/2019   Procedure: Amputation Above Knee;  Surgeon: Rosetta Posner, MD;  Location: [facility];  Service: Vascular;  Laterality: Left;  .  AMPUTATION Right 11/17/2019   Procedure: AMPUTATION ABOVE KNEE, right leg;  Surgeon: Rosetta Posner, MD;  Location: [place];  Service: Vascular;  Laterality: Right;  . AV FISTULA PLACEMENT Right 06/06/2013   Procedure: ARTERIOVENOUS (AV) FISTULA CREATION-RIGHT BRACHIAL CEPHALIC;  Surgeon: Conrad Norlina, MD;  Location: [place];  Service: Vascular;  Laterality: Right;  . BREAST BIOPSY Left   . BREAST LUMPECTOMY Left    "and took out some lymph nodes" (06/15/2013)  . CARDIAC CATHETERIZATION     04/02/15 [facility]): no angiographic CAD, LVEF 40% with global hypokinesis (LHC done for + stress echo, EF 45% 11/28/14)  . CARPAL TUNNEL RELEASE Left 11/26/2017   Procedure: LEFT CARPAL TUNNEL RELEASE;  Surgeon: [name], MD;  Location: [place];  Service: Orthopedics;  Laterality: Left;  . CATARACT EXTRACTION W/ ANTERIOR VITRECTOMY Bilateral   . CESAREAN SECTION  1980  . COLONOSCOPY    . ENTEROSCOPY N/A 12/18/2019   Procedure: ENTEROSCOPY;  Surgeon: Mansouraty, Telford Nab., MD;  Location: [place];  Service: Gastroenterology;  Laterality: N/A;  . ESOPHAGOGASTRODUODENOSCOPY N/A 07/08/2017   Procedure: ESOPHAGOGASTRODUODENOSCOPY (EGD);  Surgeon: [name], MD;  Location: [facility];  Service: Endoscopy;  Laterality: N/A;  . ESOPHAGOGASTRODUODENOSCOPY (EGD) WITH PROPOFOL N/A 02/07/2018   Procedure: ESOPHAGOGASTRODUODENOSCOPY (EGD) WITH PROPOFOL;  Surgeon: [name], MD;  Location: [facility]OSCOPY;  Service: Gastroenterology;  Laterality: N/A;  APC and clips placed  . EYE SURGERY Bilateral    laser surgery  . GIVENS CAPSULE STUDY N/A 12/19/2019   Procedure: GIVENS CAPSULE STUDY;  Surgeon: Irving Copas., MD;  Location: [place];  Service: Gastroenterology;  Laterality: N/A;  . HOT HEMOSTASIS N/A 12/18/2019   Procedure: HOT HEMOSTASIS (ARGON PLASMA COAGULATION/BICAP);  Surgeon: Irving Copas., MD;  Location: [place];  Service: Gastroenterology;  Laterality: N/A;  . LOWER  EXTREMITY ANGIOGRAPHY Bilateral 11/30/2018   Procedure: LOWER EXTREMITY ANGIOGRAPHY;  Surgeon: [name], MD;  Location: [place] [facility];  Service: Cardiovascular;  Laterality: Bilateral;  . PARS PLANA VITRECTOMY Right 05/05/2017   Procedure: PARS PLANA VITRECTOMY WITH 25 GAUGE; PARTIAL REMOVAL OF OIL; INFERIOR PERIPHERAL IRIDECTOMY, REFORM ANTERIOR CHAMBER RIGHT EYE;  Surgeon: [name], MD;  Location: [place];  Service: Ophthalmology;  Laterality: Right;  . PERIPHERAL VASCULAR BALLOON ANGIOPLASTY Right 09/21/2018   Procedure: PERIPHERAL VASCULAR BALLOON ANGIOPLASTY;  Surgeon: [name], MD;  Location: [place] [facility];  Service: Cardiovascular;  Laterality: Right;  AV fistula  . PERIPHERAL VASCULAR BALLOON ANGIOPLASTY Left 11/30/2018   Procedure: PERIPHERAL VASCULAR BALLOON ANGIOPLASTY;  Surgeon: [name], MD;  Location: [place] [facility];  Service: Cardiovascular;  Laterality: Left;  Left Anterior Tibial Artery  . REFRACTIVE SURGERY Bilateral   . REMOVAL OF A DIALYSIS CATHETER Right 06/06/2013   Procedure: REMOVAL OF RIGHT MEDIPORT;  Surgeon: Conrad Evendale, MD;  Location: [place];  Service: Vascular;  Laterality: Right;  . SUBMUCOSAL TATTOO INJECTION  12/18/2019   Procedure: SUBMUCOSAL  TATTOO INJECTION;  Surgeon: Irving Copas., MD;  Location: [place];  Service: Gastroenterology;;  . TONSILLECTOMY    . UPPER GASTROINTESTINAL ENDOSCOPY    . WOUND DEBRIDEMENT Left 09/30/2019   Procedure: IRRIGATION AND DEBRIDEMENT WOUND OF LEFT BELOW KNEE AMPUTATION;  Surgeon: [name], MD;  Location: [facility] OR;  Service: Vascular;  Laterality: Left;   Social History:  reports that she has never smoked. She has never used smokeless tobacco. She reports that she does not drink alcohol and does not use drugs.  Allergies  Allergen Reactions  . Tape Itching and Rash    53M Transpore adhesive tape. Medical tape pulls off the skin!! PAPER TAPE ONLY, PLEASE  . Latex Hives    . Oxycodone Other (See Comments)    Hallucinations   . Tramadol Other (See Comments)    Hallucinations with a full tablet  . Morphine And Related Other (See Comments)    Hallucinations   . Vicodin [Hydrocodone-Acetaminophen] Other (See Comments)    Hallucinations    Family History  Problem Relation Age of Onset  . Diabetes Mother   . Hyperlipidemia Mother   . Hypertension Mother   . Hypertension Father   . Diabetes Sister   . Diabetes Brother   . Hypertension Brother   . Heart attack Brother   . Kidney disease Brother   . Colon cancer Neg Hx   . Colon polyps Neg Hx   . Esophageal cancer Neg Hx   . Gallbladder disease Neg Hx   . Rectal cancer Neg Hx   . Stomach cancer Neg Hx    Family history: Family history reviewed and positive for DM, HLD, HTN in mother.   Prior to Admission medications   Medication Sig Start Date End Date Taking? Authorizing Provider  albuterol (PROVENTIL) (2.5 MG/53ML) 0.083% nebulizer solution Take 3 mLs (2.5 mg total) by nebulization every 6 (six) hours as needed for wheezing or shortness of breath. 10/30/16   Theodis Blaze, MD  albuterol (VENTOLIN HFA) 108 (90 Base) MCG/ACT inhaler Inhale 2 puffs into the lungs every 6 (six) hours as needed for wheezing or shortness of breath.     [provider]  Alcohol Swabs (B-D SINGLE USE SWABS REGULAR) PADS in the morning, at noon, and at bedtime.  03/08/19   [provider]  aspirin EC 81 MG tablet Take 81 mg by mouth daily.    [provider]  atropine 1 % ophthalmic solution Place 1 drop into the right eye 2 (two) times daily. 07/04/17   [provider]  BD PEN NEEDLE NANO 2ND GEN 32G X 4 MM MISC in the morning, at noon, and at bedtime.  03/03/19   [provider]  carvedilol (COREG) 25 MG tablet Take 0.5 tablets (12.5 mg total) by mouth 2 (two) times daily. 10/24/19 12/16/19  Alma Friendly, MD  carvedilol (COREG) 25 MG tablet Take 12.5 mg by mouth 2 (two) times  daily with a meal.    [provider]  carvedilol (COREG) 6.25 MG tablet Take 1 tablet (6.25 mg total) by mouth 2 (two) times daily. 12/20/19 01/19/20  Guilford Shi, MD  darbepoetin (ARANESP) 200 MCG/0.4ML SOLN injection Inject 0.4 mLs (200 mcg total) into the vein every Wednesday with hemodialysis. 06/22/13   Geradine Girt, DO  diclofenac sodium (VOLTAREN) 1 % GEL Apply 2 g topically 4 (four) times daily as needed (pain).  04/28/19   [provider]  diphenhydrAMINE (BENADRYL) 25 MG tablet Take  25 mg by mouth in the morning, at noon, and at bedtime.     [provider]  docusate sodium (COLACE) 100 MG capsule Take 100 mg by mouth 2 (two) times daily with a meal.     [provider]  doxercalciferol (HECTOROL) 4 MCG/2ML injection Inject 0.5 mLs (1 mcg total) into the vein every Monday, Wednesday, and Friday with hemodialysis. 06/18/13   Geradine Girt, DO  DULoxetine (CYMBALTA) 30 MG capsule Take 30 mg by mouth daily.    [provider]  fentaNYL (DURAGESIC) 12 MCG/HR 1 patch every 3 (three) days. 01/04/20   [provider]  fluticasone (FLONASE) 50 MCG/ACT nasal spray Place 2 sprays into both nostrils daily as needed for allergies.  02/07/19   [provider]  gabapentin (NEURONTIN) 100 MG capsule Take 100 mg by mouth 2 (two) times daily.    [provider]  insulin aspart (NOVOLOG FLEXPEN) 100 UNIT/ML FlexPen Inject 2-10 Units into the skin 2 (two) times daily with a meal.     [provider]  lanthanum (FOSRENOL) 1000 MG chewable tablet Chew 2,000-4,000 mg by mouth See admin instructions. Chew 4,000 mg by mouth with each meal and 2,000 mg with each snack    [provider]  multivitamin (RENA-VIT) TABS tablet Take 1 tablet by mouth daily. 12/20/19   [provider]  omeprazole (PRILOSEC) 40 MG capsule Take 1 capsule (40 mg total) by mouth daily. Patient taking differently: Take 40 mg by mouth 2 (two)  times daily.  02/09/18   Cherene Altes, MD  [facility] VERIO test strip 1 each by Other route 3 (three) times daily.  12/15/17   [provider]  prednisoLONE acetate (PRED FORTE) 1 % ophthalmic suspension Place 1 drop into the right eye 2 (two) times daily.  04/27/17   [provider]  SENNA LAXATIVE 8.6 MG tablet SMARTSIG:1-2 Tablet(s) By Mouth PRN 01/04/20   [provider]  traMADol (ULTRAM) 50 MG tablet Take 1 tablet (50 mg total) by mouth every 6 (six) hours as needed. 12/12/19   Dagoberto Ligas, PA-C   Physical Exam: Vitals:   05/22/20 1530 05/22/20 1536 05/22/20 1545 05/22/20 1600  BP: (!) 107/41 (!) 111/45 (!) 113/46 (!) 113/43  Pulse: 74 76 77 76  Resp: 12 16  15   Temp:  98.8 F (37.1 C)    TempSrc:  Oral    SpO2: 100% 100% 100% 100%  Weight:      Height:       Constitutional: NAD, calm, comfortable Eyes: Bilateral eye blindness ENMT: Mucous membranes are moist. Posterior pharynx clear of any exudate or lesions. Normal dentition.  Neck: normal, supple, no masses, no thyromegaly Respiratory: clear to auscultation bilaterally, no wheezing, no crackles. Normal respiratory effort. No accessory muscle use.  Cardiovascular: Regular rate and rhythm, no murmurs / rubs / gallops. No extremity edema. 2+ pedal pulses. No carotid bruits.  Abdomen: Obese abdomen, no tenderness, no masses palpated. No hepatosplenomegaly. Bowel sounds positive.  Musculoskeletal: no clubbing / cyanosis. No joint deformity upper and lower extremities. Good ROM, no contractures. Normal muscle tone.  Bilateral AKA appears to be well-healed Skin: no rashes, lesions, ulcers. No induration Neurologic: CN 2-12 grossly intact. Sensation intact. Strength 5/5 in bilateral upper extremity. Psychiatric: Normal judgment and insight. Alert and oriented x 3. Normal mood.   Labs on Admission: I have personally reviewed following labs and imaging studies  CBC: Recent Labs  Lab 05/22/20 1323  05/22/20 1414  WBC 8.9  --   NEUTROABS 6.5  --   HGB 5.1* 6.1*  HCT 17.7* 18.0*  MCV 111.3*  --   PLT 190  --    Basic Metabolic Panel: Recent Labs  Lab 05/22/20 1323 05/22/20 1414  NA 139 139  K 5.1 4.5  CL 100 100  CO2 25  --   GLUCOSE 113* 103*  BUN 30* 37*  CREATININE 6.40* 6.70*  CALCIUM 8.0*  --    GFR: Estimated Creatinine Clearance: 7.3 mL/min (A) (by C-G formula based on SCr of 6.7 mg/dL (H)). Liver Function Tests: Recent Labs  Lab 05/22/20 1323  AST 53*  ALT 20  ALKPHOS 54  BILITOT 1.0  PROT 5.5*  ALBUMIN 2.7*   Anemia Panel: Recent Labs    05/22/20 1323  RETICCTPCT 9.7*   Urine analysis:    Component Value Date/Time   COLORURINE YELLOW 02/06/2018 1709   APPEARANCEUR HAZY (A) 02/06/2018 1709   LABSPEC 1.013 02/06/2018 1709   LABSPEC 1.025 02/13/2011 1533   PHURINE 8.0 02/06/2018 1709   GLUCOSEU 50 (A) 02/06/2018 1709   HGBUR NEGATIVE 02/06/2018 1709   BILIRUBINUR NEGATIVE 02/06/2018 1709   BILIRUBINUR Negative 02/13/2011 1533   KETONESUR NEGATIVE 02/06/2018 1709   PROTEINUR 100 (A) 02/06/2018 1709   UROBILINOGEN 0.2 04/28/2011 1045   NITRITE NEGATIVE 02/06/2018 1709   LEUKOCYTESUR NEGATIVE 02/06/2018 1709   LEUKOCYTESUR Negative 02/13/2011 1533   Radiological Exams on Admission: Not indicated at this time  EKG: Independently reviewed, showing normal sinus rhythm with a rate of 75  Assessment/Plan  Principal Problem:   Upper GI bleed Active Problems:   GERD (gastroesophageal reflux disease)   ESRD on dialysis ([place])   Blindness of both eyes   Anemia in ESRD (end-stage renal disease) (HCC)   Acquired absence of eye   Acute blood loss anemia   Acute hemorrhagic shock-improved with blood administration Upper GI bleed suspect secondary to SB AVMs. 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 [facility]  If 7AM-7PM, please contact day-coverage provider [email or web address]  05/22/2020, 4:30 PM

## 2020-05-22 NOTE — ED Provider Notes (Signed)
[name] DE[facility]MENT Provider Note   CSN: [id number] Arrival date & time: 05/22/20  1253     History Chief Complaint  Patient presents with  . Blood In Stools    [name] is a 69 y.o. female with PMH of HTN, IDDM s/p bilateral AKA, ESRD on HD, GERD, and anemia who presents to the ED via EMS for GI bleed.  History was obtained by EMS and dialysis reports a four-point drop in her hemoglobin from 9-8.  She attends dialysis MWF.  Patient was given 600 mL IV NS by EMS.  Patient is resting in bed with her daughter at bedside. Patient is blind. She states that she has had blood in her stool similar to this previously which required transfusion. I spoke with her sister, with whom she lives, who states that this morning she had a large amount of dark black stool which was particularly concerning. Patient tells me that she has been experiencing generalized abdominal discomfort and fatigue beyond baseline over the course of the past 2 weeks, but became much more profound yesterday. In addition to her generalized abdominal discomfort, she is experiencing right-sided shoulder pain and left-sided chest pain. She denies any nausea or vomiting. She has been going to her hemodialysis, as scheduled despite feeling weak.    HPI     Past Medical History:  Diagnosis Date  . Anemia   . Anxiety   . Arthritis    "joints" (06/15/2013)  . Asthma   . 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 Blood transfusion without reported diagnosis   . Breast cancer ([place])    left  . CHF (congestive heart failure) ([place])   . 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 Esophageal ulcer with bleeding   . ESRD (end stage renal disease) ([place])    "suppose to start dialysis today" (06/15/2013)  . Family history of adverse reaction to anesthesia    It took a while for pt sister to wake from anesthesia  . GERD (gastroesophageal reflux disease)   . Glaucoma    blind in  both eyes  . Heart murmur    Mild AS, moderate MR, moderate TR 10/30/16 echo  . Hx of adenomatous colonic polyps 04/07/2018  . Hypertension   . Myalgia 12/31/2011  . Neuropathy 12/31/2011  . PAD (peripheral artery disease) (H[facility])    nonviable tissue left lower extremity  . Shortness of breath    "when she doesn't go to dialysis"  . Type II diabetes mellitus ([place])    Type II    Patient Active Problem List   Diagnosis Date Noted  . Angiodysplasia of intestine with hemorrhage 01/24/2020  . Acute blood loss anemia 12/16/2019  . Hypotension 12/16/2019  . 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[place]) 11/17/2019  . Infection of amputation stump, left lower extremity ([place]) 10/19/2019  . Peripheral artery disease ([place]) 09/30/2019  . Gangrene of foot ([place]) 08/30/2019  . Other fatigue 02/28/2019  . Finger pain, left 02/03/2019  . Trigger ring finger of left hand 02/03/2019  . Osteomyelitis, unspecified ([place]) 11/16/2018  . Diabetic retinopathy associated with type 2 diabetes mellitus ([place]) 11/15/2018  . Rheumatoid factor positive 11/15/2018  . Anemia in ESRD (end-stage renal disease) ([place]) 05/06/2018  . Asthma 05/06/2018  . Hx of adenomatous colonic polyps 04/07/2018  . Status post carpal tunnel release 12/10/2017  . Carpal tunnel syndrome, left upper limb 11/10/2017  . Carpal tunnel syndrome,  right upper limb 11/10/2017  . Bilateral hand numbness 09/28/2017  . Dependence on renal dialysis ([place]) 09/11/2017  . Hypertension 07/06/2017  . Chronic combined systolic and diastolic CHF (congestive heart failure) ([place]) 07/06/2017  . Complicated migraine 01/14/9484  . ESRD on dialysis ([place]) 06/04/2017  . Blindness of both eyes 06/04/2017  . Glaucoma 06/04/2017  . Essential hypertension 10/27/2016  . GERD (gastroesophageal reflux disease) 10/27/2016  . Hypercalcemia 05/28/2015  . Abnormal stress test 03/15/2015  . Poor venous access 02/08/2015  . Diarrhea, unspecified 11/02/2014  . Fever,  unspecified 11/02/2014  . Pain, unspecified 11/02/2014  . Pruritus, unspecified 11/02/2014  . Shortness of breath 07/28/2014  . Acquired absence of eye 03/31/2014  . Coagulation defect, unspecified ([place]) 03/22/2014  . Dysuria 02/14/2014  . Encounter for immunization 01/03/2014  . Iron deficiency anemia, unspecified 10/18/2013  . Unspecified protein-calorie malnutrition ([place]) 09/23/2013  . Complication of vascular dialysis catheter 06/21/2013  . Fibromyalgia 06/21/2013  . Personal history of breast cancer 06/21/2013  . Personal history of other diseases of the musculoskeletal system and connective tissue 06/21/2013  . Type 2 diabetes mellitus with diabetic polyneuropathy ([place]) 06/21/2013  . Secondary hyperparathyroidism of renal origin ([place]) 06/20/2013  . Unspecified complication of cardiac and vascular prosthetic device, implant and graft, subsequent encounter 06/20/2013  . Type II diabetes mellitus with renal manifestations ([place]) 06/15/2013  . Neuropathy 12/31/2011  . Breast cancer of upper-inner quadrant of left female breast ([place]) 06/27/2011    Past Surgical History:  Procedure Laterality Date  . A/V FISTULAGRAM N/A 09/21/2018   Procedure: A/V FISTULAGRAM - Right Upper;  Surgeon: [name], MD;  Location: [place] [facility];  Service: Cardiovascular;  Laterality: N/A;  . ABDOMINAL AORTOGRAM N/A 11/30/2018   Procedure: ABDOMINAL AORTOGRAM;  Surgeon: [name], MD;  Location: [place] [facility];  Service: Cardiovascular;  Laterality: N/A;  . ABDOMINAL AORTOGRAM W/LOWER EXTREMITY Left 07/29/2019   Procedure: ABDOMINAL AORTOGRAM W/LOWER EXTREMITY;  Surgeon: [name], MD;  Location: [place] [facility];  Service: Cardiovascular;  Laterality: Left;  . ABDOMINAL HYSTERECTOMY     partial  . AMPUTATION Left 08/30/2019   Procedure: AMPUTATION BELOW KNEE LEFT;  Surgeon: [name], MD;  Location: [facility] OR;  Service: Vascular;  Laterality: Left;  . AMPUTATION Left  10/21/2019   Procedure: Amputation Above Knee;  Surgeon: Rosetta Posner, MD;  Location: [place];  Service: Vascular;  Laterality: Left;  . AMPUTATION Right 11/17/2019   Procedure: AMPUTATION ABOVE KNEE, right leg;  Surgeon: Rosetta Posner, MD;  Location: [place];  Service: Vascular;  Laterality: Right;  . AV FISTULA PLACEMENT Right 06/06/2013   Procedure: ARTERIOVENOUS (AV) FISTULA CREATION-RIGHT BRACHIAL CEPHALIC;  Surgeon: Conrad Woodbury, MD;  Location: [place];  Service: Vascular;  Laterality: Right;  . BREAST BIOPSY Left   . BREAST LUMPECTOMY Left    "and took out some lymph nodes" (06/15/2013)  . CARDIAC CATHETERIZATION     04/02/15 [facility]): no angiographic CAD, LVEF 40% with global hypokinesis (LHC done for + stress echo, EF 45% 11/28/14)  . CARPAL TUNNEL RELEASE Left 11/26/2017   Procedure: LEFT CARPAL TUNNEL RELEASE;  Surgeon: [name], MD;  Location: [place];  Service: Orthopedics;  Laterality: Left;  . CATARACT EXTRACTION W/ ANTERIOR VITRECTOMY Bilateral   . CESAREAN SECTION  1980  . COLONOSCOPY    . ENTEROSCOPY N/A 12/18/2019   Procedure: ENTEROSCOPY;  Surgeon: Mansouraty, Telford Nab., MD;  Location: [place];  Service: Gastroenterology;  Laterality: N/A;  . ESOPHAGOGASTRODUODENOSCOPY N/A 07/08/2017   Procedure: ESOPHAGOGASTRODUODENOSCOPY (EGD);  Surgeon: [name], MD;  Location[facility] ENDOSCOPY;  Service: Endoscopy;  Laterality: N/A;  . ESOPHAGOGASTRODUODENOSCOPY (EGD) WITH PROPOFOL N/A 02/07/2018   Procedure: ESOPHAGOGASTRODUODENOSCOPY (EGD) WITH PROPOFOL;  Surgeon: [name], MD;  Location: [facility] ENDOSCOPY;  Service: Gastroenterology;  Laterality: N/A;  APC and clips placed  . EYE SURGERY Bilateral    laser surgery  . GIVENS CAPSULE STUDY N/A 12/19/2019   Procedure: GIVENS CAPSULE STUDY;  Surgeon: Irving Copas., MD;  Location: [place];  Service: Gastroenterology;  Laterality: N/A;  . HOT HEMOSTASIS N/A 12/18/2019   Procedure: HOT HEMOSTASIS (ARGON PLASMA  COAGULATION/BICAP);  Surgeon: Irving Copas., MD;  Location: [place];  Service: Gastroenterology;  Laterality: N/A;  . LOWER EXTREMITY ANGIOGRAPHY Bilateral 11/30/2018   Procedure: LOWER EXTREMITY ANGIOGRAPHY;  Surgeon: [name], MD;  Location: [place] [facility];  Service: Cardiovascular;  Laterality: Bilateral;  . PARS PLANA VITRECTOMY Right 05/05/2017   Procedure: PARS PLANA VITRECTOMY WITH 25 GAUGE; PARTIAL REMOVAL OF OIL; INFERIOR PERIPHERAL IRIDECTOMY, REFORM ANTERIOR CHAMBER RIGHT EYE;  Surgeon: [name], MD;  Location: [place];  Service: Ophthalmology;  Laterality: Right;  . PERIPHERAL VASCULAR BALLOON ANGIOPLASTY Right 09/21/2018   Procedure: PERIPHERAL VASCULAR BALLOON ANGIOPLASTY;  Surgeon: [name], MD;  Location: [place] [facility];  Service: Cardiovascular;  Laterality: Right;  AV fistula  . PERIPHERAL VASCULAR BALLOON ANGIOPLASTY Left 11/30/2018   Procedure: PERIPHERAL VASCULAR BALLOON ANGIOPLASTY;  Surgeon: [name], MD;  Location: [place] [facility];  Service: Cardiovascular;  Laterality: Left;  Left Anterior Tibial Artery  . REFRACTIVE SURGERY Bilateral   . REMOVAL OF A DIALYSIS CATHETER Right 06/06/2013   Procedure: REMOVAL OF RIGHT MEDIPORT;  Surgeon: Conrad [place], MD;  Location: [place];  Service: Vascular;  Laterality: Right;  . SUBMUCOSAL TATTOO INJECTION  12/18/2019   Procedure: SUBMUCOSAL TATTOO INJECTION;  Surgeon: Irving Copas., MD;  Location: [place];  Service: Gastroenterology;;  . TONSILLECTOMY    . UPPER GASTROINTESTINAL ENDOSCOPY    . WOUND DEBRIDEMENT Left 09/30/2019   Procedure: IRRIGATION AND DEBRIDEMENT WOUND OF LEFT BELOW KNEE AMPUTATION;  Surgeon: [name], MD;  Location: [facility] OR;  Service: Vascular;  Laterality: Left;     OB History   No obstetric history on file.     Family History  Problem Relation Age of Onset  . Diabetes Mother   . Hyperlipidemia Mother   . Hypertension Mother   .  Hypertension Father   . Diabetes Sister   . Diabetes Brother   . Hypertension Brother   . Heart attack Brother   . Kidney disease Brother   . Colon cancer Neg Hx   . Colon polyps Neg Hx   . Esophageal cancer Neg Hx   . Gallbladder disease Neg Hx   . Rectal cancer Neg Hx   . Stomach cancer Neg Hx     Social History   Tobacco Use  . Smoking status: Never Smoker  . Smokeless tobacco: Never Used  Vaping Use  . Vaping Use: Never used  Substance Use Topics  . Alcohol use: No  . Drug use: No    Home Medications Prior to Admission medications   Medication Sig Start Date End Date Taking? Authorizing Provider  albuterol (PROVENTIL) (2.5 MG/3ML) 0.083% nebulizer solution Take 3 mLs (2.5 mg total) by nebulization every 6 (six) hours as needed for wheezing or shortness of breath.  10/30/16   Theodis Blaze, MD  albuterol (VENTOLIN HFA) 108 (90 Base) MCG/ACT inhaler Inhale 2 puffs into the lungs every 6 (six) hours as needed for wheezing or shortness of breath.     [provider]  Alcohol Swabs (B-D SINGLE USE SWABS REGULAR) PADS in the morning, at noon, and at bedtime.  03/08/19   [provider]  aspirin EC 81 MG tablet Take 81 mg by mouth daily.    [provider]  atropine 1 % ophthalmic solution Place 1 drop into the right eye 2 (two) times daily. 07/04/17   [provider]  BD PEN NEEDLE NANO 2ND GEN 32G X 4 MM MISC in the morning, at noon, and at bedtime.  03/03/19   [provider]  carvedilol (COREG) 25 MG tablet Take 0.5 tablets (12.5 mg total) by mouth 2 (two) times daily. 10/24/19 12/16/19  Alma Friendly, MD  carvedilol (COREG) 25 MG tablet Take 12.5 mg by mouth 2 (two) times daily with a meal.    [provider]  carvedilol (COREG) 6.25 MG tablet Take 1 tablet (6.25 mg total) by mouth 2 (two) times daily. 12/20/19 01/19/20  Guilford Shi, MD  darbepoetin (ARANESP) 200 MCG/0.4ML SOLN injection Inject 0.4 mLs (200 mcg total)  into the vein every Wednesday with hemodialysis. 06/22/13   Geradine Girt, DO  diclofenac sodium (VOLTAREN) 1 % GEL Apply 2 g topically 4 (four) times daily as needed (pain).  04/28/19   [provider]  diphenhydrAMINE (BENADRYL) 25 MG tablet Take 25 mg by mouth in the morning, at noon, and at bedtime.     [provider]  docusate sodium (COLACE) 100 MG capsule Take 100 mg by mouth 2 (two) times daily with a meal.     [provider]  doxercalciferol (HECTOROL) 4 MCG/2ML injection Inject 0.5 mLs (1 mcg total) into the vein every Monday, Wednesday, and Friday with hemodialysis. 06/18/13   Geradine Girt, DO  DULoxetine (CYMBALTA) 30 MG capsule Take 30 mg by mouth daily.    [provider]  fentaNYL (DURAGESIC) 12 MCG/HR 1 patch every 3 (three) days. 01/04/20   [provider]  fluticasone (FLONASE) 50 MCG/ACT nasal spray Place 2 sprays into both nostrils daily as needed for allergies.  02/07/19   [provider]  gabapentin (NEURONTIN) 100 MG capsule Take 100 mg by mouth 2 (two) times daily.    [provider]  insulin aspart (NOVOLOG FLEXPEN) 100 UNIT/ML FlexPen Inject 2-10 Units into the skin 2 (two) times daily with a meal.     [provider]  lanthanum (FOSRENOL) 1000 MG chewable tablet Chew 2,000-4,000 mg by mouth See admin instructions. Chew 4,000 mg by mouth with each meal and 2,000 mg with each snack    [provider]  multivitamin (RENA-VIT) TABS tablet Take 1 tablet by mouth daily. 12/20/19   [provider]  omeprazole (PRILOSEC) 40 MG capsule Take 1 capsule (40 mg total) by mouth daily. Patient taking differently: Take 40 mg by mouth 2 (two) times daily.  02/09/18   Cherene Altes, MD  [facility] VERIO test strip 1 each by Other route 3 (three) times daily.  12/15/17   [provider]  prednisoLONE acetate (PRED FORTE) 1 % ophthalmic suspension Place 1 drop into the right eye 2 (two) times  daily.  04/27/17   [provider]  SENNA LAXATIVE 8.6 MG tablet SMARTSIG:1-2 Tablet(s) By Mouth PRN 01/04/20   [provider]  traMADol (ULTRAM) 50 MG tablet Take 1 tablet (50 mg total) by mouth every 6 (six) hours as needed. 12/12/19   Dagoberto Ligas, PA-C    Allergies    Tape, Latex, Oxycodone, Tramadol, Morphine and related, and Vicodin [hydrocodone-acetaminophen]  Review of Systems   Review of Systems  All other systems reviewed and are negative.   Physical Exam Updated Vital Signs BP (!) 108/47   Pulse 77   Temp 98.2 F (36.8 C) (Oral)   Resp 14   Ht 5\' 5"  (1.651 m)   Wt 63 kg   LMP  (LMP Unknown)   SpO2 100%   BMI 23.11 kg/m   Physical Exam Vitals and nursing note reviewed. Exam conducted with a chaperone present.  HENT:     Head: Normocephalic and atraumatic.  Eyes:     General: No scleral icterus.    Conjunctiva/sclera: Conjunctivae normal.  Cardiovascular:     Rate and Rhythm: Normal rate.     Pulses: Normal pulses.  Pulmonary:     Effort: Pulmonary effort is normal.  Abdominal:     General: Abdomen is flat. There is no distension.     Palpations: Abdomen is soft.     Tenderness: There is no abdominal tenderness.     Comments: Soft, nondistended.  Mild TTP diffusely.  No guarding.  Musculoskeletal:     Cervical back: Normal range of motion.     Right lower leg: No edema.     Left lower leg: No edema.  Skin:    General: Skin is dry.  Neurological:     Mental Status: She is alert and oriented to person, place, and time.     GCS: GCS eye subscore is 4. GCS verbal subscore is 5. GCS motor subscore is 6.  Psychiatric:        Mood and Affect: Mood normal.        Behavior: Behavior normal.        Thought Content: Thought content normal.     ED Results / Procedures / Treatments   Labs (all labs ordered are listed, but only abnormal results are displayed) Labs Reviewed  RETICULOCYTES - Abnormal; Notable for the following components:        Result Value   Retic Ct Pct 9.7 (*)    RBC. 1.62 (*)    Immature Retic Fract 35.4 (*)    All other components within normal limits  CBC WITH DIFFERENTIAL/PLATELET - Abnormal; Notable for the following components:   RBC 1.59 (*)    Hemoglobin 5.1 (*)    HCT 17.7 (*)    MCV 111.3 (*)    MCHC 28.8 (*)    RDW 24.9 (*)    All other components within normal limits  COMPREHENSIVE METABOLIC PANEL - Abnormal; Notable for the following components:   Glucose, Bld 113 (*)    BUN 30 (*)    Creatinine, Ser 6.40 (*)    Calcium 8.0 (*)    Total Protein 5.5 (*)    Albumin 2.7 (*)    AST 53 (*)    GFR, Estimated 7 (*)    All other components within normal limits  POC OCCULT BLOOD, ED - Abnormal; Notable for the following components:   Fecal Occult Bld POSITIVE (*)    All other components within normal limits  I-STAT CHEM 8, ED - Abnormal; Notable for the following components:   BUN 37 (*)    Creatinine, Ser 6.70 (*)    Glucose,  Bld 103 (*)    Calcium, Ion 0.95 (*)    Hemoglobin 6.1 (*)    HCT 18.0 (*)    All other components within normal limits  TROPONIN I (HIGH SENSITIVITY) - Abnormal; Notable for the following components:   Troponin I (High Sensitivity) 27 (*)    All other components within normal limits  VITAMIN B12  IRON AND TIBC  FERRITIN  FOLATE  PROTIME-INR  TYPE AND SCREEN  PREPARE RBC (CROSSMATCH)  PREPARE RBC (CROSSMATCH)  TROPONIN I (HIGH SENSITIVITY)    EKG None  Radiology No results found.  Procedures .Critical Care Performed by: Corena Herter, PA-C Authorized by: Corena Herter, PA-C   Critical care provider statement:    Critical care time (minutes):  60   Critical care was necessary to treat or prevent imminent or life-threatening deterioration of the following conditions:  Circulatory failure   Critical care was time spent personally by me on the following activities:  Discussions with consultants, evaluation of patient's response to treatment,  examination of patient, ordering and performing treatments and interventions, ordering and review of laboratory studies, ordering and review of radiographic studies, pulse oximetry, re-evaluation of patient's condition, obtaining history from patient or surrogate, review of old charts and blood draw for specimens Comments:     Symptomatic anemia due to upper GI bleed requiring 3 units PRBC   (including critical care time)  Medications Ordered in ED Medications  0.9 %  sodium chloride infusion (Manually program via Guardrails IV Fluids) (has no administration in time range)  pantoprazole (PROTONIX) 80 mg in sodium chloride 0.9 % 100 mL IVPB (has no administration in time range)  pantoprazole (PROTONIX) 80 mg in sodium chloride 0.9 % 100 mL (0.8 mg/mL) infusion (has no administration in time range)  0.9 %  sodium chloride infusion (has no administration in time range)  0.9 %  sodium chloride infusion (Manually program via Guardrails IV Fluids) (has no administration in time range)  0.9 %  sodium chloride infusion (Manually program via Guardrails IV Fluids) (has no administration in time range)    ED Course  I have reviewed the triage vital signs and the nursing notes.  Pertinent labs & imaging results that were available during my care of the patient were reviewed by me and considered in my medical decision making (see chart for details).  Clinical Course as of May 22 1530  Tue May 22, 2020  1510 Hemoglobin(!!): 5.1 [GG]  1511 Fecal Occult Blood, POC(!): POSITIVE [GG]  1515 Spoke with critical care who will evaluate patient at bedside.    [GG]  [place] with Nancy Marus, PA at [facility] who will see patient.   [GG]    Clinical Course User Index [GG] Corena Herter, PA-C   MDM Rules/Calculators/A&P                          Patient presents to the ED with weakness in the context of melena. She reports a history of PRBC transfusion for upper GI bleeds. Patient is notably  hypotensive here in the ED and emergency transfusion orders were placed. While she was nontachycardic, suspect that she is beta blocked. Will obtain i-STAT Chem-8 in addition to other laboratory work-up for GI bleed. Gross melena on my examination.  Labs CBC: Severe anemia to 5.1 hemoglobin, down from 8.5 in labs obtained 3 months ago. CMP: ESRD consistent with baseline. Elevated BUN to 30. Troponin: 27. Fecal occult:  Positive.  We will consult critical care and gastroenterology. Suspect that patient will likely be admitted to hospitalist services given that blood pressures are slowly trending up after administration of emergency blood.  Spoke with critical care who will evaluate patient at bedside.   Spoke with Nancy Marus, PA at [facility] who will see patient.  At shift change care was transferred to [facility], PA-C who will continue to follow patient and await evaluation by gastroenterology and critical care.     Final Clinical Impression(s) / ED Diagnoses Final diagnoses:  Symptomatic anemia  Upper GI bleed    Rx / DC Orders ED Discharge Orders    None       Corena Herter, PA-C 05/22/20 1531    Gareth Morgan, MD 05/22/20 2235

## 2020-05-22 NOTE — Consult Note (Addendum)
[facility] Consult: 3:31 PM 05/22/2020  LOS: 0 days    Referring Provider: Loney Loh in ED Primary Care Physician:  Prince Solian, MD Primary Gastroenterologist:  Dr. Carlean Purl.       Reason for Consultation: Melena, acute on chronic anemia.   HPI: [name] is a 69 y.o. female.  PMH ESRD.  Hemodialysis MWF.  PAD, s/p bil AKA.  DM2.  Vision loss/blind. Previously on Plavix.   History of GI bleeds and anemia in setting of Plavix, now discontinued.  Has required multiple PRBC transfusions 2014, 2018, 2019, on 4 occasions in 2021 06/2017 EGD: Bleeding and nonbleeding esophageal ulcers were clipped. ?  MW tear and other ulcers.   01/2018 EGD.  Normal esophagus and stomach.  Blood throughout the duodenum.  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 03/2018 colonoscopy.  Multiple, 3 to 10 mm, polyps in the descending and transverse colon resected/retrieved.  Smaller polyp in the cecum also resected/retrieved.  Otherwise normal study.  Path: TAs w/o HGD and a HP polyp.   12/18/2019 enteroscopy. Nonbleeding erosive gastropathy with no stigmata of recent bleeding.  Nonbleeding duodenal AVM treated with APC.  Nonbleeding AVM in jejunum treated with APC.  Tattoo placed at distal extent of exam into the proximal jejunum.  12/19/2019 capsule endoscopy.  Complete study with adequate prep.  No active bleeding.  Tiny nonbleeding AVM at 2 hours and 36 minutes corresponding with mid small bowel location.  01/24/2020 office follow-up with Dr. Carlean Purl.   Taking omeprazole 40 mg bid,  Plavix had been discontinued.  Discussion about surveillance colonoscopy and decision was to defer this until at least December 2021 when she presumably would have prosthesis and better ability to complete bowel  prep, otherwise as bilateral amputee she would likely require overnight admission for colonoscopy prep.  Arrived by EMS from home this afternoon with onset today of non-bloody emesis, tarry/liquid/black stools.  Some abdominal and chest pain for several days along w diminished appetite.  Weakness.  Reported recent drop of Hgb from 9 to 8 at dialysis.  No NSAIDs received 600 mL NS from EMS.   BPs to 73/27, now 111/45 after fluid bolus.     Hgb 5.1, was 8.5 five months ago.  MCV 111, previously 103.  Normal white cell count and platelets.  INR 1.3.  AKI with BUN/creatinine 37/6.7, 5 months ago was 25 6.9.  T bili 1.  Alkaline phosphatase 54.  AST/ALT 53/20.   Cared for at home by family.  No alcohol. No family history of colon polyps, cancer or gastrointestinal cancers.  No history of GI bleeds.   Past Medical History:  Diagnosis Date  . Anemia   . Anxiety   . Arthritis    "joints" (06/15/2013)  . Asthma   . 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 Blood transfusion without reported diagnosis   . Breast cancer ([place])    left  . CHF (congestive heart failure) ([place])   . 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 Esophageal ulcer with bleeding   . ESRD (end stage renal disease) ([place])    "suppose to start dialysis today" (06/15/2013)  . Family history of adverse reaction to anesthesia    It took a while for pt sister to wake from anesthesia  . GERD (gastroesophageal reflux disease)   . Glaucoma    blind in both eyes  . Heart murmur    Mild AS, moderate MR, moderate TR 10/30/16 echo  . Hx of adenomatous colonic polyps 04/07/2018  . Hypertension   . Myalgia 12/31/2011  . Neuropathy 12/31/2011  . PAD (peripheral artery disease) ([facility])    nonviable tissue left lower extremity  . Shortness of breath    "when she doesn't go to dialysis"  . Type II diabetes mellitus ([place])    Type II    Past Surgical History:  Procedure Laterality Date  . A/V FISTULAGRAM N/A 09/21/2018    Procedure: A/V FISTULAGRAM - Right Upper;  Surgeon: [name], MD;  Location: [place] [facility];  Service: Cardiovascular;  Laterality: N/A;  . ABDOMINAL AORTOGRAM N/A 11/30/2018   Procedure: ABDOMINAL AORTOGRAM;  Surgeon: [name], MD;  Location: [place] [facility];  Service: Cardiovascular;  Laterality: N/A;  . ABDOMINAL AORTOGRAM W/LOWER EXTREMITY Left 07/29/2019   Procedure: ABDOMINAL AORTOGRAM W/LOWER EXTREMITY;  Surgeon: [name], MD;  Location: [place] [facility];  Service: Cardiovascular;  Laterality: Left;  . ABDOMINAL HYSTERECTOMY     partial  . AMPUTATION Left 08/30/2019   Procedure: AMPUTATION BELOW KNEE LEFT;  Surgeon: [name], MD;  Location: [facility] OR;  Service: Vascular;  Laterality: Left;  . AMPUTATION Left 10/21/2019   Procedure: Amputation Above Knee;  Surgeon: Rosetta Posner, MD;  Location: [place];  Service: Vascular;  Laterality: Left;  . AMPUTATION Right 11/17/2019   Procedure: AMPUTATION ABOVE KNEE, right leg;  Surgeon: Rosetta Posner, MD;  Location: [place];  Service: Vascular;  Laterality: Right;  . AV FISTULA PLACEMENT Right 06/06/2013   Procedure: ARTERIOVENOUS (AV) FISTULA CREATION-RIGHT BRACHIAL CEPHALIC;  Surgeon: Conrad [place], MD;  Location: [place];  Service: Vascular;  Laterality: Right;  . BREAST BIOPSY Left   . BREAST LUMPECTOMY Left    "and took out some lymph nodes" (06/15/2013)  . CARDIAC CATHETERIZATION     04/02/15 [facility]): no angiographic CAD, LVEF 40% with global hypokinesis (LHC done for + stress echo, EF 45% 11/28/14)  . CARPAL TUNNEL RELEASE Left 11/26/2017   Procedure: LEFT CARPAL TUNNEL RELEASE;  Surgeon: [name], MD;  Location: [place];  Service: Orthopedics;  Laterality: Left;  . CATARACT EXTRACTION W/ ANTERIOR VITRECTOMY Bilateral   . CESAREAN SECTION  1980  . COLONOSCOPY    . ENTEROSCOPY N/A 12/18/2019   Procedure: ENTEROSCOPY;  Surgeon: Mansouraty, Telford Nab., MD;  Location: [place];  Service:  Gastroenterology;  Laterality: N/A;  . ESOPHAGOGASTRODUODENOSCOPY N/A 07/08/2017   Procedure: ESOPHAGOGASTRODUODENOSCOPY (EGD);  Surgeon: [name], MD;  Location: [facility] ENDOSCOPY;  Service: Endoscopy;  Laterality: N/A;  . ESOPHAGOGASTRODUODENOSCOPY (EGD) WITH PROPOFOL N/A 02/07/2018   Procedure: ESOPHAGOGASTRODUODENOSCOPY (EGD) WITH PROPOFOL;  Surgeon: [name], MD;  Location: [facility]COPY;  Service: Gastroenterology;  Laterality: N/A;  APC and clips placed  . EYE SURGERY Bilateral    laser surgery  . GIVENS CAPSULE STUDY N/A 12/19/2019   Procedure: GIVENS CAPSULE STUDY;  Surgeon: Irving Copas., MD;  Location: [place];  Service: Gastroenterology;  Laterality: N/A;  .  HOT HEMOSTASIS N/A 12/18/2019   Procedure: HOT HEMOSTASIS (ARGON PLASMA COAGULATION/BICAP);  Surgeon: Irving Copas., MD;  Location: [place];  Service: Gastroenterology;  Laterality: N/A;  . LOWER EXTREMITY ANGIOGRAPHY Bilateral 11/30/2018   Procedure: LOWER EXTREMITY ANGIOGRAPHY;  Surgeon: [name], MD;  Location: [place] [facility];  Service: Cardiovascular;  Laterality: Bilateral;  . PARS PLANA VITRECTOMY Right 05/05/2017   Procedure: PARS PLANA VITRECTOMY WITH 25 GAUGE; PARTIAL REMOVAL OF OIL; INFERIOR PERIPHERAL IRIDECTOMY, REFORM ANTERIOR CHAMBER RIGHT EYE;  Surgeon: [name], MD;  Location: [place];  Service: Ophthalmology;  Laterality: Right;  . PERIPHERAL VASCULAR BALLOON ANGIOPLASTY Right 09/21/2018   Procedure: PERIPHERAL VASCULAR BALLOON ANGIOPLASTY;  Surgeon: [name], MD;  Location: [place] [facility];  Service: Cardiovascular;  Laterality: Right;  AV fistula  . PERIPHERAL VASCULAR BALLOON ANGIOPLASTY Left 11/30/2018   Procedure: PERIPHERAL VASCULAR BALLOON ANGIOPLASTY;  Surgeon: [name], MD;  Location: [place] [facility];  Service: Cardiovascular;  Laterality: Left;  Left Anterior Tibial Artery  . REFRACTIVE SURGERY Bilateral   . REMOVAL OF A DIALYSIS  CATHETER Right 06/06/2013   Procedure: REMOVAL OF RIGHT MEDIPORT;  Surgeon: Conrad [place], MD;  Location: [place];  Service: Vascular;  Laterality: Right;  . SUBMUCOSAL TATTOO INJECTION  12/18/2019   Procedure: SUBMUCOSAL TATTOO INJECTION;  Surgeon: Irving Copas., MD;  Location: [place];  Service: Gastroenterology;;  . TONSILLECTOMY    . UPPER GASTROINTESTINAL ENDOSCOPY    . WOUND DEBRIDEMENT Left 09/30/2019   Procedure: IRRIGATION AND DEBRIDEMENT WOUND OF LEFT BELOW KNEE AMPUTATION;  Surgeon: [name], MD;  Location: [facility] OR;  Service: Vascular;  Laterality: Left;    Prior to Admission medications   Medication Sig Start Date End Date Taking? Authorizing Provider  albuterol (PROVENTIL) (2.5 MG/3ML) 0.083% nebulizer solution Take 3 mLs (2.5 mg total) by nebulization every 6 (six) hours as needed for wheezing or shortness of breath. 10/30/16   Theodis Blaze, MD  albuterol (VENTOLIN HFA) 108 (90 Base) MCG/ACT inhaler Inhale 2 puffs into the lungs every 6 (six) hours as needed for wheezing or shortness of breath.     [provider]  Alcohol Swabs (B-D SINGLE USE SWABS REGULAR) PADS in the morning, at noon, and at bedtime.  03/08/19   [provider]  aspirin EC 81 MG tablet Take 81 mg by mouth daily.    [provider]  atropine 1 % ophthalmic solution Place 1 drop into the right eye 2 (two) times daily. 07/04/17   [provider]  BD PEN NEEDLE NANO 2ND GEN 32G X 4 MM MISC in the morning, at noon, and at bedtime.  03/03/19   [provider]  carvedilol (COREG) 25 MG tablet Take 0.5 tablets (12.5 mg total) by mouth 2 (two) times daily. 10/24/19 12/16/19  Alma Friendly, MD  carvedilol (COREG) 25 MG tablet Take 12.5 mg by mouth 2 (two) times daily with a meal.    [provider]  carvedilol (COREG) 6.25 MG tablet Take 1 tablet (6.25 mg total) by mouth 2 (two) times daily. 12/20/19 01/19/20  [place] Shi, MD  darbepoetin  (ARANESP) 200 MCG/0.4ML SOLN injection Inject 0.4 mLs (200 mcg total) into the vein every Wednesday with hemodialysis. 06/22/13   Geradine Girt, DO  diclofenac sodium (VOLTAREN) 1 % GEL Apply 2 g topically 4 (four) times daily as needed (pain).  04/28/19   [provider]  diphenhydrAMINE (BENADRYL) 25 MG tablet Take  25 mg by mouth in the morning, at noon, and at bedtime.     [provider]  docusate sodium (COLACE) 100 MG capsule Take 100 mg by mouth 2 (two) times daily with a meal.     [provider]  doxercalciferol (HECTOROL) 4 MCG/2ML injection Inject 0.5 mLs (1 mcg total) into the vein every Monday, Wednesday, and Friday with hemodialysis. 06/18/13   Geradine Girt, DO  DULoxetine (CYMBALTA) 30 MG capsule Take 30 mg by mouth daily.    [provider]  fentaNYL (DURAGESIC) 12 MCG/HR 1 patch every 3 (three) days. 01/04/20   [provider]  fluticasone (FLONASE) 50 MCG/ACT nasal spray Place 2 sprays into both nostrils daily as needed for allergies.  02/07/19   [provider]  gabapentin (NEURONTIN) 100 MG capsule Take 100 mg by mouth 2 (two) times daily.    [provider]  insulin aspart (NOVOLOG FLEXPEN) 100 UNIT/ML FlexPen Inject 2-10 Units into the skin 2 (two) times daily with a meal.     [provider]  lanthanum (FOSRENOL) 1000 MG chewable tablet Chew 2,000-4,000 mg by mouth See admin instructions. Chew 4,000 mg by mouth with each meal and 2,000 mg with each snack    [provider]  multivitamin (RENA-VIT) TABS tablet Take 1 tablet by mouth daily. 12/20/19   [provider]  omeprazole (PRILOSEC) 40 MG capsule Take 1 capsule (40 mg total) by mouth daily. Patient taking differently: Take 40 mg by mouth 2 (two) times daily.  02/09/18   Cherene Altes, MD  [facility] VERIO test strip 1 each by Other route 3 (three) times daily.  12/15/17   [provider]  prednisoLONE acetate (PRED FORTE) 1 %  ophthalmic suspension Place 1 drop into the right eye 2 (two) times daily.  04/27/17   [provider]  SENNA LAXATIVE 8.6 MG tablet SMARTSIG:1-2 Tablet(s) By Mouth PRN 01/04/20   [provider]  traMADol (ULTRAM) 50 MG tablet Take 1 tablet (50 mg total) by mouth every 6 (six) hours as needed. 12/12/19   Dagoberto Ligas, PA-C    Scheduled Meds: . sodium chloride   Intravenous Once  . sodium chloride   Intravenous Once  . sodium chloride   Intravenous Once   Infusions: . sodium chloride    . pantoprozole (PROTONIX) infusion    . pantoprazole (PROTONIX) IVPB     PRN Meds:    Allergies as of 05/22/2020 - Review Complete 05/22/2020  Allergen Reaction Noted  . Tape Itching and Rash 11/24/2017  . Latex Hives 11/01/2014  . Oxycodone Other (See Comments) 08/28/2016  . Tramadol Other (See Comments) 08/28/2016  . Morphine and related Other (See Comments) 10/19/2019  . Vicodin [hydrocodone-acetaminophen] Other (See Comments) 05/05/2017    Family History  Problem Relation Age of Onset  . Diabetes Mother   . Hyperlipidemia Mother   . Hypertension Mother   . Hypertension Father   . Diabetes Sister   . Diabetes Brother   . Hypertension Brother   . Heart attack Brother   . Kidney disease Brother   . Colon cancer Neg Hx   . Colon polyps Neg Hx   . Esophageal cancer Neg Hx   . Gallbladder disease Neg Hx   . Rectal cancer Neg Hx   . Stomach cancer Neg Hx     Social History   Socioeconomic History  . Marital status: Single    Spouse name: Not on file  . Number of  children: 2  . Years of education: 33  . Highest education level: Not on file  Occupational History  . Occupation: Diabled  Tobacco Use  . Smoking status: Never Smoker  . Smokeless tobacco: Never Used  Vaping Use  . Vaping Use: Never used  Substance and Sexual Activity  . Alcohol use: No  . Drug use: No  . Sexual activity: Not Currently    Comment: Hysterectomy  Other Topics Concern  . Not on  file  Social History Narrative   She reports she is single with one son and one daughter. 3 caffeinated beverages a day. She dialyzes Monday Wednesday Friday at the [facility].      Her sister is her primary caretaker and spends the nights with her   Social Determinants of Health   Financial Resource Strain:   . Difficulty of Paying Living Expenses: Not on file  Food Insecurity:   . Worried About Charity fundraiser in the Last Year: Not on file  . Ran Out of Food in the Last Year: Not on file  Transportation Needs: No Transportation Needs  . Lack of Transportation (Medical): No  . Lack of Transportation (Non-Medical): No  Physical Activity:   . Days of Exercise per Week: Not on file  . Minutes of Exercise per Session: Not on file  Stress:   . Feeling of Stress : Not on file  Social Connections:   . Frequency of Communication with Friends and Family: Not on file  . Frequency of Social Gatherings with Friends and Family: Not on file  . Attends Religious Services: Not on file  . Active Member of Clubs or Organizations: Not on file  . Attends Archivist Meetings: Not on file  . Marital Status: Not on file  Intimate Partner Violence:   . Fear of Current or Ex-Partner: Not on file  . Emotionally Abused: Not on file  . Physically Abused: Not on file  . Sexually Abused: Not on file    REVIEW OF SYSTEMS: Constitutional: Nonsevere weakness. ENT:  No nose bleeds Pulm: No shortness of breath or cough CV:  No palpitations, no LE edema.  Nonexertional mild chest discomfort. GU:  No hematuria, no frequency GI: See HPI. Heme: Denies excessive or unusual bleeding or bruising. Transfusions: See HPI. Neuro:  No headaches, no peripheral tingling or numbness.  No LOC, no syncope. Derm:  No itching, no rash or sores.  Endocrine:  No sweats or chills.  No polyuria or dysuria Immunization: Underwent Moderna COVID-19 vaccination. Travel:  None beyond local counties in  last few months.    PHYSICAL EXAM: Vital signs in last 24 hours: Vitals:   05/22/20 1500 05/22/20 1515  BP: (!) 109/45 (!) 108/47  Pulse: 73 77  Resp: 13 14  Temp:  98.2 F (36.8 C)  SpO2: 100% 100%   Wt Readings from Last 3 Encounters:  05/22/20 63 kg  01/24/20 71.2 kg  01/05/20 69.9 kg   PRBC and Protonix bolus are hanging. General: Obese, pleasant, comfortable, chronically ill-appearing, alert Head: No facial asymmetry or swelling.  No signs of head trauma. Eyes: No scleral icterus.  No conjunctival pallor. Ears: Not hard of hearing Nose: No discharge or congestion Mouth: Mucosa is moist, pink, clear.  Tongue midline. Neck: No JVD, no masses, no thyromegaly. Lungs: No labored breathing, no cough.  Lungs are clear bilaterally. Heart: RRR.  No MRG.  S1, S2 present Abdomen: Soft.  Not tender or distended.  Bowel  sounds active.  No HSM, masses, bruits, hernias.   Rectal: Deferred. Musc/Skeltl: No joint redness, swelling or gross deformity.  Status post bilateral AKA, stump sites with well-healed incisions Extremities: Well-healed incisions on AKA stump sites bilaterally. Neurologic: Oriented x3.  Alert.  Blind.  Moves all 4 limbs, strength not tested.  No tremors. Skin: No rash, no sores, no telangiectasia   Psych: Pleasant, calm, good historian.  Intake/Output from previous day: No intake/output data recorded. Intake/Output this shift: Total I/O In: 315 [Blood:315] Out: -   LAB RESULTS: Recent Labs    05/22/20 1323 05/22/20 1414  WBC 8.9  --   HGB 5.1* 6.1*  HCT 17.7* 18.0*  PLT 190  --    BMET Lab Results  Component Value Date   NA 139 05/22/2020   NA 139 05/22/2020   NA 136 12/20/2019   K 4.5 05/22/2020   K 5.1 05/22/2020   K 4.3 12/20/2019   CL 100 05/22/2020   CL 100 05/22/2020   CL 98 12/20/2019   CO2 25 05/22/2020   CO2 24 12/20/2019   CO2 29 12/17/2019   GLUCOSE 103 (H) 05/22/2020   GLUCOSE 113 (H) 05/22/2020   GLUCOSE 113 (H) 12/20/2019     BUN 37 (H) 05/22/2020   BUN 30 (H) 05/22/2020   BUN 25 (H) 12/20/2019   CREATININE 6.70 (H) 05/22/2020   CREATININE 6.40 (H) 05/22/2020   CREATININE 6.99 (H) 12/20/2019   CALCIUM 8.0 (L) 05/22/2020   CALCIUM 8.8 (L) 12/20/2019   CALCIUM 8.3 (L) 12/17/2019   LFT Recent Labs    05/22/20 1323  PROT 5.5*  ALBUMIN 2.7*  AST 53*  ALT 20  ALKPHOS 54  BILITOT 1.0   PT/INR Lab Results  Component Value Date   INR 1.3 (H) 11/17/2019   INR 1.16 05/07/2018   INR 1.13 02/06/2018   Hepatitis Panel No results for input(s): HEPBSAG, HCVAB, HEPAIGM, HEPBIGM in the last 72 hours. C-Diff No components found for: CDIFF Lipase     Component Value Date/Time   LIPASE 32 12/16/2019 1701    Drugs of Abuse  No results found for: LABOPIA, COCAINSCRNUR, LABBENZ, AMPHETMU, THCU, LABBARB   RADIOLOGY STUDIES: No results found.    IMPRESSION:   *   Acute on chronic blood loss anemia w melena. Previous endoscopies have confirmed presence of erosive gastropathy, nonbleeding duodenal and jejunal AVMs all of which were treated with APC fulguration. At colonoscopy in 03/2018 multiple tubular adenomatous polyps and a hyperplastic polyp were removed. Suspect she is bleeding from AVMs in the upper GI tract. MCV is macrocytic.  Iron, B12 and folate studies are all pending.Marland Kitchen Has been transfused on multiple occasions, most recently during admission with GI bleed in 12/2019. Transfusion of 2 PRBCs ordered.    *  ESRD.  On HD MWF.  *    PVD.  Status post bil AKA.  Has not yet received her own personal prosthetics and remains nonambulatory.   PLAN:     *    Consult IR for CTAP w angio, goal to embolize culprit vessell  *   Clear diet after CTAP completed may have ice chips for the time being  *   Plan inpatient colonoscopy and possible enteroscopy for 11/4.  Follow-up CBC after completion of 2 PRBCs.  *   Continue Protonix drip.  *   In addition to CBC in the morning, follow-up on the  iron/anemia/B12/folate studies.   Azucena Freed  05/22/2020, 3:31 PM Phone [phone]   Attending physician's note   I have taken an interval history, reviewed the chart and examined the patient. I agree with the Advanced Practitioner's note, impression and recommendations.   Melena.  HD stable currently. Hb 5.1 on Adm s/p 2U PRBC Acute on chronic blood loss anemia likely d/t SB AVMs.  Enteroscopy 11/2019 with duodenal/jejunal AVM s/p APC. CE 11/2019: Tiny nonbleeding AVM in mid SB.  Off Plavix previously d/t recurrent GI bleed. History of colonic polyps 03/2018. ESRD on HD (MWF) Multiple comorbid conditions including PVD/PAD s/p B/L AKA (bedbound), blindness, DM2, CHF, HTN, Breast Ca  Plan: -CTA. If pos, mesenteric angio with embolization. -Inpatient enteroscopy/colon likely on 05/24/2020.  Anticipate difficulty in preparation for colonoscopy.  Also note that she has HD on 05/23/2020. -Trend CBC. -IV Protonix. -D/W Pt and pt's son. -Please call with any ?/Change in status.   Carmell Austria, MD [facility]

## 2020-05-22 NOTE — H&P (View-Only) (Signed)
[facility] Consult: 3:31 PM 05/22/2020  LOS: 0 days    Referring Provider: Loney Loh in ED Primary Care Physician:  Prince Solian, MD Primary Gastroenterologist:  Dr. Carlean Purl.       Reason for Consultation: Melena, acute on chronic anemia.   HPI: [name] is a 69 y.o. female.  PMH ESRD.  Hemodialysis MWF.  PAD, s/p bil AKA.  DM2.  Vision loss/blind. Previously on Plavix.   History of GI bleeds and anemia in setting of Plavix, now discontinued.  Has required multiple PRBC transfusions 2014, 2018, 2019, on 4 occasions in 2021 06/2017 EGD: Bleeding and nonbleeding esophageal ulcers were clipped. ?  MW tear and other ulcers.   01/2018 EGD.  Normal esophagus and stomach.  Blood throughout the duodenum.  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 03/2018 colonoscopy.  Multiple, 3 to 10 mm, polyps in the descending and transverse colon resected/retrieved.  Smaller polyp in the cecum also resected/retrieved.  Otherwise normal study.  Path: TAs w/o HGD and a HP polyp.   12/18/2019 enteroscopy. Nonbleeding erosive gastropathy with no stigmata of recent bleeding.  Nonbleeding duodenal AVM treated with APC.  Nonbleeding AVM in jejunum treated with APC.  Tattoo placed at distal extent of exam into the proximal jejunum.  12/19/2019 capsule endoscopy.  Complete study with adequate prep.  No active bleeding.  Tiny nonbleeding AVM at 2 hours and 36 minutes corresponding with mid small bowel location.  01/24/2020 office follow-up with Dr. Carlean Purl.   Taking omeprazole 40 mg bid,  Plavix had been discontinued.  Discussion about surveillance colonoscopy and decision was to defer this until at least December 2021 when she presumably would have prosthesis and better ability to complete bowel  prep, otherwise as bilateral amputee she would likely require overnight admission for colonoscopy prep.  Arrived by EMS from home this afternoon with onset today of non-bloody emesis, tarry/liquid/black stools.  Some abdominal and chest pain for several days along w diminished appetite.  Weakness.  Reported recent drop of Hgb from 9 to 8 at dialysis.  No NSAIDs received 600 mL NS from EMS.   BPs to 73/27, now 111/45 after fluid bolus.     Hgb 5.1, was 8.5 five months ago.  MCV 111, previously 103.  Normal white cell count and platelets.  INR 1.3.  AKI with BUN/creatinine 37/6.7, 5 months ago was 25 6.9.  T bili 1.  Alkaline phosphatase 54.  AST/ALT 53/20.   Cared for at home by family.  No alcohol. No family history of colon polyps, cancer or gastrointestinal cancers.  No history of GI bleeds.   Past Medical History:  Diagnosis Date  . Anemia   . Anxiety   . Arthritis    "joints" (06/15/2013)  . Asthma   . 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 Blood transfusion without reported diagnosis   . Breast cancer ([place])    left  . CHF (congestive heart failure) ([place])   . 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 Esophageal ulcer with bleeding   . ESRD (end stage renal disease) ([place])    "suppose to start dialysis today" (06/15/2013)  . Family history of adverse reaction to anesthesia    It took a while for pt sister to wake from anesthesia  . GERD (gastroesophageal reflux disease)   . Glaucoma    blind in both eyes  . Heart murmur    Mild AS, moderate MR, moderate TR 10/30/16 echo  . Hx of adenomatous colonic polyps 04/07/2018  . Hypertension   . Myalgia 12/31/2011  . Neuropathy 12/31/2011  . PAD (peripheral artery disease) ([facility])    nonviable tissue left lower extremity  . Shortness of breath    "when she doesn't go to dialysis"  . Type II diabetes mellitus ([place])    Type II    Past Surgical History:  Procedure Laterality Date  . A/V FISTULAGRAM N/A 09/21/2018    Procedure: A/V FISTULAGRAM - Right Upper;  Surgeon: [name], MD;  Location: [place] [facility];  Service: Cardiovascular;  Laterality: N/A;  . ABDOMINAL AORTOGRAM N/A 11/30/2018   Procedure: ABDOMINAL AORTOGRAM;  Surgeon: [name], MD;  Location: [place] [facility];  Service: Cardiovascular;  Laterality: N/A;  . ABDOMINAL AORTOGRAM W/LOWER EXTREMITY Left 07/29/2019   Procedure: ABDOMINAL AORTOGRAM W/LOWER EXTREMITY;  Surgeon: [name], MD;  Location: [place] [facility];  Service: Cardiovascular;  Laterality: Left;  . ABDOMINAL HYSTERECTOMY     partial  . AMPUTATION Left 08/30/2019   Procedure: AMPUTATION BELOW KNEE LEFT;  Surgeon: [name], MD;  Location: [facility] OR;  Service: Vascular;  Laterality: Left;  . AMPUTATION Left 10/21/2019   Procedure: Amputation Above Knee;  Surgeon: Rosetta Posner, MD;  Location: [place];  Service: Vascular;  Laterality: Left;  . AMPUTATION Right 11/17/2019   Procedure: AMPUTATION ABOVE KNEE, right leg;  Surgeon: Rosetta Posner, MD;  Location: [place];  Service: Vascular;  Laterality: Right;  . AV FISTULA PLACEMENT Right 06/06/2013   Procedure: ARTERIOVENOUS (AV) FISTULA CREATION-RIGHT BRACHIAL CEPHALIC;  Surgeon: Conrad Bismarck, MD;  Location: [place];  Service: Vascular;  Laterality: Right;  . BREAST BIOPSY Left   . BREAST LUMPECTOMY Left    "and took out some lymph nodes" (06/15/2013)  . CARDIAC CATHETERIZATION     04/02/15 [facility]): no angiographic CAD, LVEF 40% with global hypokinesis (LHC done for + stress echo, EF 45% 11/28/14)  . CARPAL TUNNEL RELEASE Left 11/26/2017   Procedure: LEFT CARPAL TUNNEL RELEASE;  Surgeon: [name], MD;  Location: [place];  Service: Orthopedics;  Laterality: Left;  . CATARACT EXTRACTION W/ ANTERIOR VITRECTOMY Bilateral   . CESAREAN SECTION  1980  . COLONOSCOPY    . ENTEROSCOPY N/A 12/18/2019   Procedure: ENTEROSCOPY;  Surgeon: Mansouraty, Telford Nab., MD;  Location: [place];  Service:  Gastroenterology;  Laterality: N/A;  . ESOPHAGOGASTRODUODENOSCOPY N/A 07/08/2017   Procedure: ESOPHAGOGASTRODUODENOSCOPY (EGD);  Surgeon: [name], MD;  Location: [facility] ENDOSCOPY;  Service: Endoscopy;  Laterality: N/A;  . ESOPHAGOGASTRODUODENOSCOPY (EGD) WITH PROPOFOL N/A 02/07/2018   Procedure: ESOPHAGOGASTRODUODENOSCOPY (EGD) WITH PROPOFOL;  Surgeon: [name], MD;  Location: [facility] ENDOSCOPY;  Service: Gastroenterology;  Laterality: N/A;  APC and clips placed  . EYE SURGERY Bilateral    laser surgery  . GIVENS CAPSULE STUDY N/A 12/19/2019   Procedure: GIVENS CAPSULE STUDY;  Surgeon: Irving Copas., MD;  Location: [place];  Service: Gastroenterology;  Laterality: N/A;  .  HOT HEMOSTASIS N/A 12/18/2019   Procedure: HOT HEMOSTASIS (ARGON PLASMA COAGULATION/BICAP);  Surgeon: Irving Copas., MD;  Location: [place];  Service: Gastroenterology;  Laterality: N/A;  . LOWER EXTREMITY ANGIOGRAPHY Bilateral 11/30/2018   Procedure: LOWER EXTREMITY ANGIOGRAPHY;  Surgeon: [name], MD;  Location: [place] [facility];  Service: Cardiovascular;  Laterality: Bilateral;  . PARS PLANA VITRECTOMY Right 05/05/2017   Procedure: PARS PLANA VITRECTOMY WITH 25 GAUGE; PARTIAL REMOVAL OF OIL; INFERIOR PERIPHERAL IRIDECTOMY, REFORM ANTERIOR CHAMBER RIGHT EYE;  Surgeon: [name], MD;  Location: [place];  Service: Ophthalmology;  Laterality: Right;  . PERIPHERAL VASCULAR BALLOON ANGIOPLASTY Right 09/21/2018   Procedure: PERIPHERAL VASCULAR BALLOON ANGIOPLASTY;  Surgeon: [name], MD;  Location: [place] [facility];  Service: Cardiovascular;  Laterality: Right;  AV fistula  . PERIPHERAL VASCULAR BALLOON ANGIOPLASTY Left 11/30/2018   Procedure: PERIPHERAL VASCULAR BALLOON ANGIOPLASTY;  Surgeon: [name], MD;  Location: [place] [facility];  Service: Cardiovascular;  Laterality: Left;  Left Anterior Tibial Artery  . REFRACTIVE SURGERY Bilateral   . REMOVAL OF A DIALYSIS  CATHETER Right 06/06/2013   Procedure: REMOVAL OF RIGHT MEDIPORT;  Surgeon: Conrad [place], MD;  Location: [place];  Service: Vascular;  Laterality: Right;  . SUBMUCOSAL TATTOO INJECTION  12/18/2019   Procedure: SUBMUCOSAL TATTOO INJECTION;  Surgeon: Irving Copas., MD;  Location: [place];  Service: Gastroenterology;;  . TONSILLECTOMY    . UPPER GASTROINTESTINAL ENDOSCOPY    . WOUND DEBRIDEMENT Left 09/30/2019   Procedure: IRRIGATION AND DEBRIDEMENT WOUND OF LEFT BELOW KNEE AMPUTATION;  Surgeon: [name], MD;  Location[facility];  Service: Vascular;  Laterality: Left;    Prior to Admission medications   Medication Sig Start Date End Date Taking? Authorizing Provider  albuterol (PROVENTIL) (2.5 MG/3ML) 0.083% nebulizer solution Take 3 mLs (2.5 mg total) by nebulization every 6 (six) hours as needed for wheezing or shortness of breath. 10/30/16   Theodis Blaze, MD  albuterol (VENTOLIN HFA) 108 (90 Base) MCG/ACT inhaler Inhale 2 puffs into the lungs every 6 (six) hours as needed for wheezing or shortness of breath.     [provider]  Alcohol Swabs (B-D SINGLE USE SWABS REGULAR) PADS in the morning, at noon, and at bedtime.  03/08/19   [provider]  aspirin EC 81 MG tablet Take 81 mg by mouth daily.    [provider]  atropine 1 % ophthalmic solution Place 1 drop into the right eye 2 (two) times daily. 07/04/17   [provider]  BD PEN NEEDLE NANO 2ND GEN 32G X 4 MM MISC in the morning, at noon, and at bedtime.  03/03/19   [provider]  carvedilol (COREG) 25 MG tablet Take 0.5 tablets (12.5 mg total) by mouth 2 (two) times daily. 10/24/19 12/16/19  Alma Friendly, MD  carvedilol (COREG) 25 MG tablet Take 12.5 mg by mouth 2 (two) times daily with a meal.    [provider]  carvedilol (COREG) 6.25 MG tablet Take 1 tablet (6.25 mg total) by mouth 2 (two) times daily. 12/20/19 01/19/20  Guilford Shi, MD  darbepoetin  (ARANESP) 200 MCG/0.4ML SOLN injection Inject 0.4 mLs (200 mcg total) into the vein every Wednesday with hemodialysis. 06/22/13   Geradine Girt, DO  diclofenac sodium (VOLTAREN) 1 % GEL Apply 2 g topically 4 (four) times daily as needed (pain).  04/28/19   [provider]  diphenhydrAMINE (BENADRYL) 25 MG tablet Take  25 mg by mouth in the morning, at noon, and at bedtime.     [provider]  docusate sodium (COLACE) 100 MG capsule Take 100 mg by mouth 2 (two) times daily with a meal.     [provider]  doxercalciferol (HECTOROL) 4 MCG/2ML injection Inject 0.5 mLs (1 mcg total) into the vein every Monday, Wednesday, and Friday with hemodialysis. 06/18/13   Geradine Girt, DO  DULoxetine (CYMBALTA) 30 MG capsule Take 30 mg by mouth daily.    [provider]  fentaNYL (DURAGESIC) 12 MCG/HR 1 patch every 3 (three) days. 01/04/20   [provider]  fluticasone (FLONASE) 50 MCG/ACT nasal spray Place 2 sprays into both nostrils daily as needed for allergies.  02/07/19   [provider]  gabapentin (NEURONTIN) 100 MG capsule Take 100 mg by mouth 2 (two) times daily.    [provider]  insulin aspart (NOVOLOG FLEXPEN) 100 UNIT/ML FlexPen Inject 2-10 Units into the skin 2 (two) times daily with a meal.     [provider]  lanthanum (FOSRENOL) 1000 MG chewable tablet Chew 2,000-4,000 mg by mouth See admin instructions. Chew 4,000 mg by mouth with each meal and 2,000 mg with each snack    [provider]  multivitamin (RENA-VIT) TABS tablet Take 1 tablet by mouth daily. 12/20/19   [provider]  omeprazole (PRILOSEC) 40 MG capsule Take 1 capsule (40 mg total) by mouth daily. Patient taking differently: Take 40 mg by mouth 2 (two) times daily.  02/09/18   Cherene Altes, MD  [facility] VERIO test strip 1 each by Other route 3 (three) times daily.  12/15/17   [provider]  prednisoLONE acetate (PRED FORTE) 1 %  ophthalmic suspension Place 1 drop into the right eye 2 (two) times daily.  04/27/17   [provider]  SENNA LAXATIVE 8.6 MG tablet SMARTSIG:1-2 Tablet(s) By Mouth PRN 01/04/20   [provider]  traMADol (ULTRAM) 50 MG tablet Take 1 tablet (50 mg total) by mouth every 6 (six) hours as needed. 12/12/19   Dagoberto Ligas, PA-C    Scheduled Meds: . sodium chloride   Intravenous Once  . sodium chloride   Intravenous Once  . sodium chloride   Intravenous Once   Infusions: . sodium chloride    . pantoprozole (PROTONIX) infusion    . pantoprazole (PROTONIX) IVPB     PRN Meds:    Allergies as of 05/22/2020 - Review Complete 05/22/2020  Allergen Reaction Noted  . Tape Itching and Rash 11/24/2017  . Latex Hives 11/01/2014  . Oxycodone Other (See Comments) 08/28/2016  . Tramadol Other (See Comments) 08/28/2016  . Morphine and related Other (See Comments) 10/19/2019  . Vicodin [hydrocodone-acetaminophen] Other (See Comments) 05/05/2017    Family History  Problem Relation Age of Onset  . Diabetes Mother   . Hyperlipidemia Mother   . Hypertension Mother   . Hypertension Father   . Diabetes Sister   . Diabetes Brother   . Hypertension Brother   . Heart attack Brother   . Kidney disease Brother   . Colon cancer Neg Hx   . Colon polyps Neg Hx   . Esophageal cancer Neg Hx   . Gallbladder disease Neg Hx   . Rectal cancer Neg Hx   . Stomach cancer Neg Hx     Social History   Socioeconomic History  . Marital status: Single    Spouse name: Not on file  . Number of  children: 2  . Years of education: 46  . Highest education level: Not on file  Occupational History  . Occupation: Diabled  Tobacco Use  . Smoking status: Never Smoker  . Smokeless tobacco: Never Used  Vaping Use  . Vaping Use: Never used  Substance and Sexual Activity  . Alcohol use: No  . Drug use: No  . Sexual activity: Not Currently    Comment: Hysterectomy  Other Topics Concern  . Not on  file  Social History Narrative   She reports she is single with one son and one daughter. 3 caffeinated beverages a day. She dialyzes Monday Wednesday Friday at the [facility].      Her sister is her primary caretaker and spends the nights with her   Social Determinants of Health   Financial Resource Strain:   . Difficulty of Paying Living Expenses: Not on file  Food Insecurity:   . Worried About Charity fundraiser in the Last Year: Not on file  . Ran Out of Food in the Last Year: Not on file  Transportation Needs: No Transportation Needs  . Lack of Transportation (Medical): No  . Lack of Transportation (Non-Medical): No  Physical Activity:   . Days of Exercise per Week: Not on file  . Minutes of Exercise per Session: Not on file  Stress:   . Feeling of Stress : Not on file  Social Connections:   . Frequency of Communication with Friends and Family: Not on file  . Frequency of Social Gatherings with Friends and Family: Not on file  . Attends Religious Services: Not on file  . Active Member of Clubs or Organizations: Not on file  . Attends Archivist Meetings: Not on file  . Marital Status: Not on file  Intimate Partner Violence:   . Fear of Current or Ex-Partner: Not on file  . Emotionally Abused: Not on file  . Physically Abused: Not on file  . Sexually Abused: Not on file    REVIEW OF SYSTEMS: Constitutional: Nonsevere weakness. ENT:  No nose bleeds Pulm: No shortness of breath or cough CV:  No palpitations, no LE edema.  Nonexertional mild chest discomfort. GU:  No hematuria, no frequency GI: See HPI. Heme: Denies excessive or unusual bleeding or bruising. Transfusions: See HPI. Neuro:  No headaches, no peripheral tingling or numbness.  No LOC, no syncope. Derm:  No itching, no rash or sores.  Endocrine:  No sweats or chills.  No polyuria or dysuria Immunization: Underwent Moderna COVID-19 vaccination. Travel:  None beyond local counties in  last few months.    PHYSICAL EXAM: Vital signs in last 24 hours: Vitals:   05/22/20 1500 05/22/20 1515  BP: (!) 109/45 (!) 108/47  Pulse: 73 77  Resp: 13 14  Temp:  98.2 F (36.8 C)  SpO2: 100% 100%   Wt Readings from Last 3 Encounters:  05/22/20 63 kg  01/24/20 71.2 kg  01/05/20 69.9 kg   PRBC and Protonix bolus are hanging. General: Obese, pleasant, comfortable, chronically ill-appearing, alert Head: No facial asymmetry or swelling.  No signs of head trauma. Eyes: No scleral icterus.  No conjunctival pallor. Ears: Not hard of hearing Nose: No discharge or congestion Mouth: Mucosa is moist, pink, clear.  Tongue midline. Neck: No JVD, no masses, no thyromegaly. Lungs: No labored breathing, no cough.  Lungs are clear bilaterally. Heart: RRR.  No MRG.  S1, S2 present Abdomen: Soft.  Not tender or distended.  Bowel  sounds active.  No HSM, masses, bruits, hernias.   Rectal: Deferred. Musc/Skeltl: No joint redness, swelling or gross deformity.  Status post bilateral AKA, stump sites with well-healed incisions Extremities: Well-healed incisions on AKA stump sites bilaterally. Neurologic: Oriented x3.  Alert.  Blind.  Moves all 4 limbs, strength not tested.  No tremors. Skin: No rash, no sores, no telangiectasia   Psych: Pleasant, calm, good historian.  Intake/Output from previous day: No intake/output data recorded. Intake/Output this shift: Total I/O In: 315 [Blood:315] Out: -   LAB RESULTS: Recent Labs    05/22/20 1323 05/22/20 1414  WBC 8.9  --   HGB 5.1* 6.1*  HCT 17.7* 18.0*  PLT 190  --    BMET Lab Results  Component Value Date   NA 139 05/22/2020   NA 139 05/22/2020   NA 136 12/20/2019   K 4.5 05/22/2020   K 5.1 05/22/2020   K 4.3 12/20/2019   CL 100 05/22/2020   CL 100 05/22/2020   CL 98 12/20/2019   CO2 25 05/22/2020   CO2 24 12/20/2019   CO2 29 12/17/2019   GLUCOSE 103 (H) 05/22/2020   GLUCOSE 113 (H) 05/22/2020   GLUCOSE 113 (H) 12/20/2019     BUN 37 (H) 05/22/2020   BUN 30 (H) 05/22/2020   BUN 25 (H) 12/20/2019   CREATININE 6.70 (H) 05/22/2020   CREATININE 6.40 (H) 05/22/2020   CREATININE 6.99 (H) 12/20/2019   CALCIUM 8.0 (L) 05/22/2020   CALCIUM 8.8 (L) 12/20/2019   CALCIUM 8.3 (L) 12/17/2019   LFT Recent Labs    05/22/20 1323  PROT 5.5*  ALBUMIN 2.7*  AST 53*  ALT 20  ALKPHOS 54  BILITOT 1.0   PT/INR Lab Results  Component Value Date   INR 1.3 (H) 11/17/2019   INR 1.16 05/07/2018   INR 1.13 02/06/2018   Hepatitis Panel No results for input(s): HEPBSAG, HCVAB, HEPAIGM, HEPBIGM in the last 72 hours. C-Diff No components found for: CDIFF Lipase     Component Value Date/Time   LIPASE 32 12/16/2019 1701    Drugs of Abuse  No results found for: LABOPIA, COCAINSCRNUR, LABBENZ, AMPHETMU, THCU, LABBARB   RADIOLOGY STUDIES: No results found.    IMPRESSION:   *   Acute on chronic blood loss anemia w melena. Previous endoscopies have confirmed presence of erosive gastropathy, nonbleeding duodenal and jejunal AVMs all of which were treated with APC fulguration. At colonoscopy in 03/2018 multiple tubular adenomatous polyps and a hyperplastic polyp were removed. Suspect she is bleeding from AVMs in the upper GI tract. MCV is macrocytic.  Iron, B12 and folate studies are all pending.Marland Kitchen Has been transfused on multiple occasions, most recently during admission with GI bleed in 12/2019. Transfusion of 2 PRBCs ordered.    *  ESRD.  On HD MWF.  *    PVD.  Status post bil AKA.  Has not yet received her own personal prosthetics and remains nonambulatory.   PLAN:     *    Consult IR for CTAP w angio, goal to embolize culprit vessell  *   Clear diet after CTAP completed may have ice chips for the time being  *   Plan inpatient colonoscopy and possible enteroscopy for 11/4.  Follow-up CBC after completion of 2 PRBCs.  *   Continue Protonix drip.  *   In addition to CBC in the morning, follow-up on the  iron/anemia/B12/folate studies.   Azucena Freed  05/22/2020, 3:31 PM Phone [phone]   Attending physician's note   I have taken an interval history, reviewed the chart and examined the patient. I agree with the Advanced Practitioner's note, impression and recommendations.   Melena.  HD stable currently. Hb 5.1 on Adm s/p 2U PRBC Acute on chronic blood loss anemia likely d/t SB AVMs.  Enteroscopy 11/2019 with duodenal/jejunal AVM s/p APC. CE 11/2019: Tiny nonbleeding AVM in mid SB.  Off Plavix previously d/t recurrent GI bleed. History of colonic polyps 03/2018. ESRD on HD (MWF) Multiple comorbid conditions including PVD/PAD s/p B/L AKA (bedbound), blindness, DM2, CHF, HTN, Breast Ca  Plan: -CTA. If pos, mesenteric angio with embolization. -Inpatient enteroscopy/colon likely on 05/24/2020.  Anticipate difficulty in preparation for colonoscopy.  Also note that she has HD on 05/23/2020. -Trend CBC. -IV Protonix. -D/W Pt and pt's son. -Please call with any ?/Change in status.   Carmell Austria, MD [facility]

## 2020-05-22 NOTE — H&P (Deleted)
NAME:  [name], MRN:  [id number], DOB:  Nov 28, 1950, LOS: 0 ADMISSION DATE:  05/22/2020, CONSULTATION DATE:  11/2 REFERRING MD:  Dr. Maryan Rued, CHIEF COMPLAINT:  GIB, hemorrhagic shock   Brief History   69 year old female with ESRD presented 11/2 with complaints of melena. Hypotensive to SBP 60s in the ED. PCCM asked to evaluate.   History of present illness   69 year old female with PMH as below, which is significant for ESRD on HD, DM, bilateral AKA, and GIB in the setting of known AVM and plavix use. 11/1 she had some complaints of SOB and sharp chest pains intermittently with no association with her HD treatment that day. Then 11/2 she developed large volume melanic stools as witnessed by her sister who lives with her. This prompted EMS call and transport to the ED. Immediately upon arrival to the ED she was found to be hypotensive with SBP in the 60s. Hemoglobin 5.1. 2 units PRBC ordered. After one unit she remained hypotensive and PCCM was called for further evaluation.   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   Significant Hospital Events   11/2 admit  Consults:  GI PCCM  Procedures:    Significant Diagnostic Tests:    Micro Data:    Antimicrobials:     Interim history/subjective:    Objective   Blood pressure (!) 111/45, pulse 76, temperature 98.8 F (37.1 C),  temperature source Oral, resp. rate 16, height 5\' 5"  (1.651 m), weight 63 kg, SpO2 100 %.        Intake/Output Summary (Last 24 hours) at 05/22/2020 1559 Last data filed at 05/22/2020 1445 Gross per 24 hour  Intake 315 ml  Output --  Net 315 ml   Filed Weights   05/22/20 1300  Weight: 63 kg    Examination: General: obese adult female HENT: [place]/AT, PERRL, no JVD Lungs: Clear bilateral breath sounds.  Cardiovascular: RRR, 3/6 systolic murmur.  Abdomen: Soft, diffusely tender. Non-distended Extremities: No acute deformity. Bilateral AKA remote.  Neuro: Alert, oriented, non-focal.   Resolved Hospital Problem list     Assessment & Plan:   Hypotension related to GI hemorrhage: patient has a knw - s/p one unit of emergency release PRBC, second unit transfusing now with SBP 120.  - Schedule for a third unit after type and screen done.   - Protonix - GI consult pending - Hemodynamics have improved and GI has no plans for urgent/bedside endoscopy. Patient stable for admission to progressive care. PCCM will be available as needed.    Labs   CBC: Recent Labs  Lab 05/22/20 1323 05/22/20 1414  WBC 8.9  --   NEUTROABS 6.5  --   HGB 5.1* 6.1*  HCT 17.7* 18.0*  MCV 111.3*  --   PLT 190  --     Basic Metabolic Panel: Recent Labs  Lab 05/22/20 1323 05/22/20 1414  NA 139 139  K 5.1 4.5  CL 100 100  CO2 25  --   GLUCOSE 113* 103*  BUN 30* 37*  CREATININE 6.40* 6.70*  CALCIUM 8.0*  --    GFR: Estimated Creatinine Clearance: 7.3 mL/min (A) (by C-G formula based on SCr of 6.7 mg/dL (H)). Recent Labs  Lab 05/22/20 1323  WBC 8.9    Liver Function Tests: Recent Labs  Lab 05/22/20 1323  AST 53*  ALT 20  ALKPHOS 54  BILITOT 1.0  PROT 5.5*  ALBUMIN 2.7*   No results for input(s): LIPASE, AMYLASE in the last 168 hours. No results for input(s): AMMONIA in the last 168 hours.  ABG    Component Value Date/Time   TCO2 28 05/22/2020 1414     Coagulation  Profile: No results for input(s): INR, PROTIME in the last 168 hours.  Cardiac Enzymes: No results for input(s): CKTOTAL, CKMB, CKMBINDEX, TROPONINI in the last 168 hours.  HbA1C: Hgb A1c MFr Bld  Date/Time Value Ref Range Status  10/19/2019 10:25 PM 6.0 (H) 4.8 - 5.6 % Final    Comment:    (NOTE) Pre diabetes:          5.7%-6.4% Diabetes:              >6.4% Glycemic control for   <7.0% adults with diabetes   08/30/2019 06:03 AM 7.0 (H) 4.8 - 5.6 % Final    Comment:    (NOTE)         Prediabetes: 5.7 - 6.4         Diabetes: >6.4         Glycemic control for adults with diabetes: <7.0     CBG: No results for input(s): GLUCAP in the last 168 hours.  Review of Systems:   Bolds are positive  Constitutional: weight loss, gain, night sweats, Fevers, chills, fatigue .  HEENT: headaches, Sore throat, sneezing, nasal congestion, post nasal drip, Difficulty swallowing, Tooth/dental problems, visual complaints visual changes, ear ache CV:  chest pain, radiates:,Orthopnea, PND, swelling in lower extremities, dizziness, palpitations, syncope.  GI  heartburn, indigestion, abdominal pain, nausea, vomiting, diarrhea, change in bowel habits, loss of appetite, bloody stools.  Resp: cough, productive: , hemoptysis, dyspnea, chest pain, pleuritic.  Skin: rash or itching or icterus GU: dysuria, change in color of urine, urgency or frequency. flank pain, hematuria  MS: joint pain or swelling. decreased range of motion  Psych: change in mood or affect. depression or anxiety.  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   Surgical History    Past Surgical History:  Procedure Laterality Date  . A/V FISTULAGRAM N/A 09/21/2018   Procedure: A/V FISTULAGRAM - Right Upper;  Surgeon: [name], MD;  Location: [facility];  Service: Cardiovascular;  Laterality: N/A;  . ABDOMINAL AORTOGRAM N/A 11/30/2018   Procedure: ABDOMINAL AORTOGRAM;  Surgeon: [name], MD;  Location: [place] [facility];  Service: Cardiovascular;  Laterality: N/A;  . ABDOMINAL AORTOGRAM W/LOWER EXTREMITY Left 07/29/2019   Procedure: ABDOMINAL AORTOGRAM W/LOWER EXTREMITY;  Surgeon: [name], MD;  Location: [place] [facility];  Service: Cardiovascular;  Laterality: Left;  . ABDOMINAL HYSTERECTOMY     partial  . AMPUTATION Left 08/30/2019   Procedure: AMPUTATION BELOW KNEE LEFT;  Surgeon: [name], MD;  Location: [facility] OR;  Service: Vascular;  Laterality: Left;  . AMPUTATION Left 10/21/2019   Procedure: Amputation Above Knee;  Surgeon: Rosetta Posner, MD;  Location: [facility];  Service: Vascular;  Laterality: Left;  . AMPUTATION Right 11/17/2019   Procedure: AMPUTATION ABOVE KNEE, right leg;  Surgeon: Rosetta Posner, MD;  Location: [facility];  Service: Vascular;  Laterality: Right;  . AV FISTULA PLACEMENT Right 06/06/2013   Procedure: ARTERIOVENOUS (AV) FISTULA CREATION-RIGHT BRACHIAL CEPHALIC;  Surgeon: Conrad [place], MD;  Location: [place];  Service: Vascular;  Laterality: Right;  . BREAST BIOPSY Left   . BREAST LUMPECTOMY Left    "and took out some lymph nodes" (06/15/2013)  . CARDIAC CATHETERIZATION     04/02/15 [facility]): no angiographic CAD, LVEF 40% with global hypokinesis (LHC done for + stress echo, EF 45% 11/28/14)  . CARPAL TUNNEL RELEASE  Left 11/26/2017   Procedure: LEFT CARPAL TUNNEL RELEASE;  Surgeon: [name], MD;  Location: [place];  Service: Orthopedics;  Laterality: Left;  . CATARACT EXTRACTION W/ ANTERIOR VITRECTOMY Bilateral   . CESAREAN SECTION  1980  . COLONOSCOPY    . ENTEROSCOPY N/A 12/18/2019   Procedure: ENTEROSCOPY;  Surgeon: Mansouraty, Telford Nab., MD;  Location: [facility];  Service: Gastroenterology;  Laterality: N/A;  . ESOPHAGOGASTRODUODENOSCOPY N/A 07/08/2017   Procedure: ESOPHAGOGASTRODUODENOSCOPY (EGD);  Surgeon: [name], MD;  Location: [facility] ENDOSCOPY;  Service: Endoscopy;  Laterality: N/A;  . ESOPHAGOGASTRODUODENOSCOPY (EGD) WITH PROPOFOL N/A 02/07/2018   Procedure: ESOPHAGOGASTRODUODENOSCOPY (EGD) WITH PROPOFOL;  Surgeon: [name], MD;  Location: [facility] ENDOSCOPY;  Service: Gastroenterology;  Laterality: N/A;  APC and clips placed  . EYE SURGERY Bilateral    laser surgery  . GIVENS CAPSULE STUDY N/A 12/19/2019   Procedure: GIVENS CAPSULE STUDY;  Surgeon: Irving Copas., MD;  Location: [place];  Service: Gastroenterology;  Laterality: N/A;  . HOT HEMOSTASIS N/A 12/18/2019   Procedure: HOT HEMOSTASIS (ARGON PLASMA COAGULATION/BICAP);  Surgeon: Irving Copas., MD;  Location: [place];  Service: Gastroenterology;  Laterality: N/A;  . LOWER EXTREMITY ANGIOGRAPHY Bilateral 11/30/2018   Procedure: LOWER EXTREMITY ANGIOGRAPHY;  Surgeon: [name], MD;  Location: [place] [facility];  Service: Cardiovascular;  Laterality: Bilateral;  . PARS PLANA VITRECTOMY Right 05/05/2017   Procedure: PARS PLANA VITRECTOMY WITH 25 GAUGE; PARTIAL REMOVAL OF OIL; INFERIOR PERIPHERAL IRIDECTOMY, REFORM ANTERIOR CHAMBER RIGHT EYE;  Surgeon: [name], MD;  Location: [place];  Service: Ophthalmology;  Laterality: Right;  . PERIPHERAL VASCULAR BALLOON ANGIOPLASTY Right 09/21/2018   Procedure: PERIPHERAL VASCULAR BALLOON ANGIOPLASTY;  Surgeon: [name], MD;  Location: [place] [facility];  Service: Cardiovascular;  Laterality: Right;  AV fistula  . PERIPHERAL VASCULAR BALLOON ANGIOPLASTY Left 11/30/2018   Procedure: PERIPHERAL VASCULAR BALLOON ANGIOPLASTY;  Surgeon: [name], MD;  Location: [place] [facility];  Service: Cardiovascular;  Laterality: Left;  Left Anterior Tibial Artery  . REFRACTIVE SURGERY Bilateral   . REMOVAL OF A DIALYSIS CATHETER Right 06/06/2013   Procedure: REMOVAL OF RIGHT MEDIPORT;  Surgeon: Conrad [place], MD;  Location: [place];  Service: Vascular;  Laterality: Right;  . SUBMUCOSAL TATTOO INJECTION  12/18/2019   Procedure: SUBMUCOSAL TATTOO INJECTION;  Surgeon: Irving Copas., MD;  Location:  [facility] ENDOSCOPY;  Service: Gastroenterology;;  . TONSILLECTOMY    . UPPER GASTROINTESTINAL ENDOSCOPY    . WOUND DEBRIDEMENT Left 09/30/2019   Procedure: IRRIGATION AND DEBRIDEMENT WOUND OF LEFT BELOW KNEE AMPUTATION;  Surgeon: [name], MD;  Location: [facility] OR;  Service: Vascular;  Laterality: Left;     Social History   reports that she has never smoked. She has never used smokeless tobacco. She reports that she does not drink alcohol and does not use drugs.   Family History   Her family history includes Diabetes in her brother, mother, and sister; Heart attack in her brother; Hyperlipidemia in her mother; Hypertension in her brother, father, and mother; Kidney disease in her brother. There is no history of Colon cancer, Colon polyps, Esophageal cancer, Gallbladder disease, Rectal cancer, or Stomach cancer.   Allergies Allergies  Allergen Reactions  . Tape Itching and Rash    61M Transpore adhesive tape. Medical tape pulls off the skin!! PAPER TAPE ONLY, PLEASE  . Latex Hives  . Oxycodone Other (See Comments)    Hallucinations   . Tramadol Other (See Comments)    Hallucinations with a full tablet  . Morphine And Related Other (See Comments)    Hallucinations   . Vicodin [Hydrocodone-Acetaminophen] Other (See Comments)     Hallucinations      Home Medications  Prior to Admission medications   Medication Sig Start Date End Date Taking? Authorizing Provider  albuterol (PROVENTIL) (2.5 MG/61ML) 0.083% nebulizer solution Take 3 mLs (2.5 mg total) by nebulization every 6 (six) hours as needed for wheezing or shortness of breath. 10/30/16   Theodis Blaze, MD  albuterol (VENTOLIN HFA) 108 (90 Base) MCG/ACT inhaler Inhale 2 puffs into the lungs every 6 (six) hours as needed for wheezing or shortness of breath.     [provider]  Alcohol Swabs (B-D SINGLE USE SWABS REGULAR) PADS in the morning, at noon, and at bedtime.  03/08/19   [provider]  aspirin EC 81 MG tablet Take 81 mg by mouth daily.    [provider]  atropine 1 % ophthalmic solution Place 1 drop into the right eye 2 (two) times daily. 07/04/17   [provider]  BD PEN NEEDLE NANO 2ND GEN 32G X 4 MM MISC in the morning, at noon, and at bedtime.  03/03/19   [provider]  carvedilol (COREG) 25 MG tablet Take 0.5 tablets (12.5 mg total) by mouth 2 (two) times daily. 10/24/19 12/16/19  Alma Friendly, MD  carvedilol (COREG) 25 MG tablet Take 12.5 mg by mouth 2 (two) times daily with a meal.    [provider]  carvedilol (COREG) 6.25 MG tablet Take 1 tablet (6.25 mg total) by mouth 2 (two) times daily. 12/20/19 01/19/20  [place] Shi, MD  darbepoetin (ARANESP) 200 MCG/0.4ML SOLN injection Inject 0.4 mLs (200 mcg total) into the vein every Wednesday with hemodialysis. 06/22/13   Geradine Girt, DO  diclofenac sodium (VOLTAREN) 1 % GEL Apply 2 g topically 4 (four) times daily as needed (pain).  04/28/19   [provider]  diphenhydrAMINE (BENADRYL) 25 MG tablet Take 25 mg by mouth in the morning, at noon, and at bedtime.     [provider]  docusate sodium (COLACE) 100 MG capsule Take 100 mg by mouth 2 (two) times daily with a meal.     [provider]  doxercalciferol  (HECTOROL) 4 MCG/2ML injection Inject 0.5 mLs (1 mcg total) into the  vein every Monday, Wednesday, and Friday with hemodialysis. 06/18/13   Geradine Girt, DO  DULoxetine (CYMBALTA) 30 MG capsule Take 30 mg by mouth daily.    [provider]  fentaNYL (DURAGESIC) 12 MCG/HR 1 patch every 3 (three) days. 01/04/20   [provider]  fluticasone (FLONASE) 50 MCG/ACT nasal spray Place 2 sprays into both nostrils daily as needed for allergies.  02/07/19   [provider]  gabapentin (NEURONTIN) 100 MG capsule Take 100 mg by mouth 2 (two) times daily.    [provider]  insulin aspart (NOVOLOG FLEXPEN) 100 UNIT/ML FlexPen Inject 2-10 Units into the skin 2 (two) times daily with a meal.     [provider]  lanthanum (FOSRENOL) 1000 MG chewable tablet Chew 2,000-4,000 mg by mouth See admin instructions. Chew 4,000 mg by mouth with each meal and 2,000 mg with each snack    [provider]  multivitamin (RENA-VIT) TABS tablet Take 1 tablet by mouth daily. 12/20/19   [provider]  omeprazole (PRILOSEC) 40 MG capsule Take 1 capsule (40 mg total) by mouth daily. Patient taking differently: Take 40 mg by mouth 2 (two) times daily.  02/09/18   Cherene Altes, MD  [facility] VERIO test strip 1 each by Other route 3 (three) times daily.  12/15/17   [provider]  prednisoLONE acetate (PRED FORTE) 1 % ophthalmic suspension Place 1 drop into the right eye 2 (two) times daily.  04/27/17   [provider]  SENNA LAXATIVE 8.6 MG tablet SMARTSIG:1-2 Tablet(s) By Mouth PRN 01/04/20   [provider]  traMADol (ULTRAM) 50 MG tablet Take 1 tablet (50 mg total) by mouth every 6 (six) hours as needed. 12/12/19   Dagoberto Ligas, PA-C      Georgann Housekeeper, AGACNP-BC [place] for personal pager PCCM on call pager [phone]  05/22/2020 4:27 PM

## 2020-05-23 ENCOUNTER — Encounter (HOSPITAL_COMMUNITY): Payer: Self-pay | Admitting: Internal Medicine

## 2020-05-23 DIAGNOSIS — Z6823 Body mass index (BMI) 23.0-23.9, adult: Secondary | ICD-10-CM | POA: Diagnosis not present

## 2020-05-23 DIAGNOSIS — Z7401 Bed confinement status: Secondary | ICD-10-CM | POA: Diagnosis not present

## 2020-05-23 DIAGNOSIS — Z794 Long term (current) use of insulin: Secondary | ICD-10-CM | POA: Diagnosis not present

## 2020-05-23 DIAGNOSIS — H548 Legal blindness, as defined in USA: Secondary | ICD-10-CM | POA: Diagnosis not present

## 2020-05-23 DIAGNOSIS — D62 Acute posthemorrhagic anemia: Secondary | ICD-10-CM | POA: Diagnosis not present

## 2020-05-23 DIAGNOSIS — K635 Polyp of colon: Secondary | ICD-10-CM | POA: Diagnosis not present

## 2020-05-23 DIAGNOSIS — K648 Other hemorrhoids: Secondary | ICD-10-CM | POA: Diagnosis present

## 2020-05-23 DIAGNOSIS — D649 Anemia, unspecified: Secondary | ICD-10-CM | POA: Diagnosis not present

## 2020-05-23 DIAGNOSIS — Z9001 Acquired absence of eye: Secondary | ICD-10-CM | POA: Diagnosis not present

## 2020-05-23 DIAGNOSIS — K5730 Diverticulosis of large intestine without perforation or abscess without bleeding: Secondary | ICD-10-CM | POA: Diagnosis present

## 2020-05-23 DIAGNOSIS — E1151 Type 2 diabetes mellitus with diabetic peripheral angiopathy without gangrene: Secondary | ICD-10-CM | POA: Diagnosis present

## 2020-05-23 DIAGNOSIS — Z20822 Contact with and (suspected) exposure to covid-19: Secondary | ICD-10-CM | POA: Diagnosis not present

## 2020-05-23 DIAGNOSIS — I132 Hypertensive heart and chronic kidney disease with heart failure and with stage 5 chronic kidney disease, or end stage renal disease: Secondary | ICD-10-CM | POA: Diagnosis not present

## 2020-05-23 DIAGNOSIS — K31811 Angiodysplasia of stomach and duodenum with bleeding: Secondary | ICD-10-CM | POA: Diagnosis not present

## 2020-05-23 DIAGNOSIS — E1129 Type 2 diabetes mellitus with other diabetic kidney complication: Secondary | ICD-10-CM | POA: Diagnosis not present

## 2020-05-23 DIAGNOSIS — I509 Heart failure, unspecified: Secondary | ICD-10-CM | POA: Diagnosis not present

## 2020-05-23 DIAGNOSIS — N2581 Secondary hyperparathyroidism of renal origin: Secondary | ICD-10-CM | POA: Diagnosis not present

## 2020-05-23 DIAGNOSIS — E1140 Type 2 diabetes mellitus with diabetic neuropathy, unspecified: Secondary | ICD-10-CM | POA: Diagnosis not present

## 2020-05-23 DIAGNOSIS — N250 Renal osteodystrophy: Secondary | ICD-10-CM | POA: Diagnosis not present

## 2020-05-23 DIAGNOSIS — E8889 Other specified metabolic disorders: Secondary | ICD-10-CM | POA: Diagnosis present

## 2020-05-23 DIAGNOSIS — E1122 Type 2 diabetes mellitus with diabetic chronic kidney disease: Secondary | ICD-10-CM | POA: Diagnosis present

## 2020-05-23 DIAGNOSIS — N186 End stage renal disease: Secondary | ICD-10-CM | POA: Diagnosis not present

## 2020-05-23 DIAGNOSIS — E46 Unspecified protein-calorie malnutrition: Secondary | ICD-10-CM | POA: Diagnosis not present

## 2020-05-23 DIAGNOSIS — K2970 Gastritis, unspecified, without bleeding: Secondary | ICD-10-CM | POA: Diagnosis not present

## 2020-05-23 DIAGNOSIS — D509 Iron deficiency anemia, unspecified: Secondary | ICD-10-CM | POA: Diagnosis not present

## 2020-05-23 DIAGNOSIS — K31819 Angiodysplasia of stomach and duodenum without bleeding: Secondary | ICD-10-CM | POA: Diagnosis not present

## 2020-05-23 DIAGNOSIS — Z853 Personal history of malignant neoplasm of breast: Secondary | ICD-10-CM | POA: Diagnosis not present

## 2020-05-23 DIAGNOSIS — K922 Gastrointestinal hemorrhage, unspecified: Secondary | ICD-10-CM | POA: Diagnosis present

## 2020-05-23 DIAGNOSIS — K219 Gastro-esophageal reflux disease without esophagitis: Secondary | ICD-10-CM | POA: Diagnosis not present

## 2020-05-23 DIAGNOSIS — D631 Anemia in chronic kidney disease: Secondary | ICD-10-CM | POA: Diagnosis present

## 2020-05-23 DIAGNOSIS — E669 Obesity, unspecified: Secondary | ICD-10-CM | POA: Diagnosis present

## 2020-05-23 DIAGNOSIS — Z992 Dependence on renal dialysis: Secondary | ICD-10-CM | POA: Diagnosis not present

## 2020-05-23 DIAGNOSIS — R578 Other shock: Secondary | ICD-10-CM | POA: Diagnosis not present

## 2020-05-23 LAB — TYPE AND SCREEN
ABO/RH(D): A POS
Antibody Screen: NEGATIVE
Unit division: 0
Unit division: 0
Unit division: 0
Unit division: 0

## 2020-05-23 LAB — COMPREHENSIVE METABOLIC PANEL
ALT: 14 U/L (ref 0–44)
AST: 18 U/L (ref 15–41)
Albumin: 2.9 g/dL — ABNORMAL LOW (ref 3.5–5.0)
Alkaline Phosphatase: 59 U/L (ref 38–126)
Anion gap: 15 (ref 5–15)
BUN: 36 mg/dL — ABNORMAL HIGH (ref 8–23)
CO2: 25 mmol/L (ref 22–32)
Calcium: 8.4 mg/dL — ABNORMAL LOW (ref 8.9–10.3)
Chloride: 96 mmol/L — ABNORMAL LOW (ref 98–111)
Creatinine, Ser: 7.3 mg/dL — ABNORMAL HIGH (ref 0.44–1.00)
GFR, Estimated: 6 mL/min — ABNORMAL LOW (ref >60)
Glucose, Bld: 137 mg/dL — ABNORMAL HIGH (ref 70–99)
Potassium: 4.8 mmol/L (ref 3.5–5.1)
Sodium: 136 mmol/L (ref 135–145)
Total Bilirubin: 0.9 mg/dL (ref 0.3–1.2)
Total Protein: 5.8 g/dL — ABNORMAL LOW (ref 6.5–8.1)

## 2020-05-23 LAB — BPAM RBC
Blood Product Expiration Date: 202111222359
Blood Product Expiration Date: 202111222359
Blood Product Expiration Date: 202111292359
Blood Product Expiration Date: 202111292359
ISSUE DATE / TIME: 202111021410
ISSUE DATE / TIME: 202111021502
ISSUE DATE / TIME: 202111021717
ISSUE DATE / TIME: 202111021833
Unit Type and Rh: 5100
Unit Type and Rh: 5100
Unit Type and Rh: 6200
Unit Type and Rh: 6200

## 2020-05-23 LAB — MRSA PCR SCREENING: MRSA by PCR: POSITIVE — AB

## 2020-05-23 LAB — CBC
HCT: 26 % — ABNORMAL LOW (ref 36.0–46.0)
Hemoglobin: 8.5 g/dL — ABNORMAL LOW (ref 12.0–15.0)
MCH: 31.5 pg (ref 26.0–34.0)
MCHC: 32.7 g/dL (ref 30.0–36.0)
MCV: 96.3 fL (ref 80.0–100.0)
Platelets: 228 10*3/uL (ref 150–400)
RBC: 2.7 MIL/uL — ABNORMAL LOW (ref 3.87–5.11)
RDW: 23.3 % — ABNORMAL HIGH (ref 11.5–15.5)
WBC: 9 10*3/uL (ref 4.0–10.5)
nRBC: 0.2 % (ref 0.0–0.2)

## 2020-05-23 LAB — GLUCOSE, CAPILLARY
Glucose-Capillary: 137 mg/dL — ABNORMAL HIGH (ref 70–99)
Glucose-Capillary: 125 mg/dL — ABNORMAL HIGH (ref 70–99)
Glucose-Capillary: 106 mg/dL — ABNORMAL HIGH (ref 70–99)
Glucose-Capillary: 134 mg/dL — ABNORMAL HIGH (ref 70–99)

## 2020-05-23 LAB — IRON AND TIBC
Iron: 162 ug/dL (ref 28–170)
Saturation Ratios: 66 % — ABNORMAL HIGH (ref 10.4–31.8)
TIBC: 245 ug/dL — ABNORMAL LOW (ref 250–450)
UIBC: 83 ug/dL

## 2020-05-23 LAB — APTT: aPTT: 30 seconds (ref 24–36)

## 2020-05-23 LAB — FERRITIN: Ferritin: 2222 ng/mL — ABNORMAL HIGH (ref 11–307)

## 2020-05-23 LAB — FOLATE: Folate: >100 ng/mL (ref >5.9)

## 2020-05-23 LAB — PROTIME-INR
INR: 1.2 (ref 0.8–1.2)
Prothrombin Time: 15.2 seconds (ref 11.4–15.2)

## 2020-05-23 LAB — VITAMIN B12: Vitamin B-12: 759 pg/mL (ref 180–914)

## 2020-05-23 MED ORDER — CHLORHEXIDINE GLUCONATE CLOTH 2 % EX PADS
6.0000 | MEDICATED_PAD | Freq: Every day | CUTANEOUS | Status: DC
  Administered 2020-05-23 – 2020-05-24 (×2): 6 via TOPICAL

## 2020-05-23 MED ORDER — MUPIROCIN 2 % EX OINT
1.0000 "application " | TOPICAL_OINTMENT | Freq: Two times a day (BID) | CUTANEOUS | Status: DC
  Administered 2020-05-23 – 2020-05-25 (×5): 1 via NASAL
  Filled 2020-05-23: qty 22

## 2020-05-23 MED ORDER — PEG-KCL-NACL-NASULF-NA ASC-C 100 G PO SOLR
0.5000 | Freq: Once | ORAL | Status: AC
  Administered 2020-05-23: 100 g via ORAL
  Filled 2020-05-23 (×2): qty 1

## 2020-05-23 MED ORDER — METOCLOPRAMIDE HCL 5 MG/ML IJ SOLN
5.0000 mg | Freq: Once | INTRAMUSCULAR | Status: AC
  Administered 2020-05-23: 5 mg via INTRAVENOUS
  Filled 2020-05-23: qty 2

## 2020-05-23 MED ORDER — BISACODYL 5 MG PO TBEC
20.0000 mg | DELAYED_RELEASE_TABLET | Freq: Once | ORAL | Status: AC
  Administered 2020-05-23: 20 mg via ORAL
  Filled 2020-05-23: qty 4

## 2020-05-23 MED ORDER — METOCLOPRAMIDE HCL 5 MG/ML IJ SOLN
5.0000 mg | Freq: Once | INTRAMUSCULAR | Status: AC
  Administered 2020-05-24: 5 mg via INTRAVENOUS
  Filled 2020-05-23: qty 2

## 2020-05-23 MED ORDER — PEG-KCL-NACL-NASULF-NA ASC-C 100 G PO SOLR: 1.0000 | Freq: Once | ORAL | Status: DC

## 2020-05-23 MED ORDER — PANTOPRAZOLE SODIUM 40 MG IV SOLR
40.0000 mg | Freq: Two times a day (BID) | INTRAVENOUS | Status: DC
  Administered 2020-05-23 – 2020-05-24 (×2): 40 mg via INTRAVENOUS
  Filled 2020-05-23 (×2): qty 40

## 2020-05-23 MED ORDER — PEG-KCL-NACL-NASULF-NA ASC-C 100 G PO SOLR
0.5000 | Freq: Once | ORAL | Status: AC
  Administered 2020-05-24: 100 g via ORAL

## 2020-05-23 NOTE — Progress Notes (Signed)
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 Probable upper GI bleed a. Bleeding seems to have stopped arguing against massive bleed b. Defer to GI further planning including scope and other interventions--continue only clear liquid or as per GI c. Continue Protonix gtt. in addition to prep for colonoscopy and defer rest to GI  d. IR was consulted given known history of upper intestinal AVMs and are recommending tagged RBC scan if large amount of bleeding 2. hemorrhagic shock on admission a. Secondary to bleeding b. Continue interventions as above does not need pressors at this time c. Hold fluids for now given dialysis state and no hypotension 3. ESRD/HD a. Defer to nephrology further planning b. All meds on hold cautiously  resumed post procedure Hectorol, Phos renal iron aspirin etc. etc. 4. EF 45-50% a. Continue Coreg when more stable held from admission 5. DM TY 2 1/nephropathy neuropathy blindness a. Sugars 1 37-1 54 b. Continue very sensitive sliding scale 6. prior bilateral AKA's with complications will need a. Further outpatient management 7. ?  Right liver lesion a. Needs dedicated imaging in the next several months once results  DVT prophylaxis: SCD if possible (patient has history AKA) Code Status: Full presumed Family Communication: None present currently Disposition:   Status is: Observation  The patient will require care spanning > 2 midnights and should be moved to inpatient because: Hemodynamically unstable, Persistent severe electrolyte disturbances and Ongoing active pain requiring inpatient pain management  Dispo: The patient is from: Home              Anticipated d/c is to: Home              Anticipated d/c date is: 3 days              Patient currently is not medically stable to d/c.       Consultants:   GI  Renal  Procedures: Multiple  Antimicrobials: None   Subjective: Awake coherent main concern is when she is going to be able to eat No chest pain no fever no chills   Objective: Vitals:   05/22/20 2248 05/23/20 0030 05/23/20 0441 05/23/20 0600  BP: (!) 97/43 (!) 103/44 (!) 108/41 (!) 109/45  Pulse: 76 74 74   Resp: 17 18 14    Temp: 99 F (37.2 C) 99 F (37.2 C)  98.6 F (37 C)   TempSrc: Bladder Oral Oral   SpO2: 97% 97% 98%   Weight: 74 kg     Height:        Intake/Output Summary (Last 24 hours) at 05/23/2020 0732 Last data filed at 05/23/2020 0238 Gross per 24 hour  Intake 1036.83 ml  Output --  Net 1036.83 ml   Filed Weights   05/22/20 1300 05/22/20 2248  Weight: 63 kg 74 kg    Examination:  General exam: EOMI NCAT no icterus no pallor no focal deficit Respiratory system: Clear no added sound rales rhonchi Cardiovascular system: S1-S2 no  murmur no rub no gallop sinus, sinus tach on monitors Gastrointestinal system: Soft nontender no rebound no guarding. Central nervous system: Intact to gross motor in terms of power 5/5, sensory intact Extremities: Bilateral AKA noted Skin: None Psychiatry: Euthymic and coherent  Data Reviewed: I have personally reviewed following labs and imaging studies BUN/creatinine 36/7.3 saturation ratio 66 ferritin 2222 hemoglobin 8.5 up from admission INR 1.2  Radiology Studies: CT Angio Abd/Pel w/ and/or w/o  Result Date: 05/22/2020 CLINICAL DATA:  GI bleed with hemoglobin drop. EXAM: CTA ABDOMEN AND PELVIS WITHOUT AND WITH CONTRAST TECHNIQUE: Multidetector CT imaging of the abdomen and pelvis was performed using the standard protocol during bolus administration of intravenous contrast. Multiplanar reconstructed images and MIPs were obtained and reviewed to evaluate the vascular anatomy. CONTRAST:  170mL OMNIPAQUE IOHEXOL 350 MG/ML SOLN COMPARISON:  Most recent abdominal CT 08/28/2016 FINDINGS: VASCULAR Aorta: Moderately advanced atherosclerosis with primarily calcified plaque. No dissection or aneurysm. No severe stenosis. Celiac: Minimal stenosis at the origin due to calcified plaque. No poststenotic dilatation. Branch vessels are moderately calcified. No dissection or acute findings. SMA: Mixed calcified noncalcified atheromatous plaque with irregular plaque involving the proximal SMA. Distal branches are densely calcified. No evidence of severe stenosis or propped occlusion. Renals: Calcified plaque at the origin of both renal arteries with mild stenosis. Peripheral branches are patent but calcified. IMA: Stenotic at the origin with mild hypertrophy distally. Inflow: Densely calcified without severe stenosis or acute findings. Proximal Outflow: Densely calcified. Irregular calcifications project into the lumen of the proximal right superficial femoral artery causing greater than 50% stenosis. Veins:  Portal venous phase obtained, portal vein and mesenteric vessels are patent. No evidence of acute thrombus. Limited assessment of the IVC and iliac vasculature due to contrast bolus timing. Review of the MIP images confirms the above findings. NON-VASCULAR Lower chest: Multi chamber cardiomegaly. Contrast refluxes into the hepatic veins and IVC consistent with elevated right heart pressures. Ground-glass densities in the lung bases may represent pulmonary edema. Trace left pleural effusion. Hepatobiliary: The liver is enlarged spanning 22 cm cranial caudal. There is subcapsular low-density involving the right lobe measuring approximately 3.5 cm best appreciated on portal venous phase imaging. Slight capsular retraction. This is not faintly visualized on prior exam. Decompressed gallbladder. No calcified gallstone. Pancreas: Parenchymal atrophy. No ductal dilatation or inflammation. Spleen: Normal in size without focal abnormality. Small splenule inferiorly. Adrenals/Urinary Tract: Left adrenal thickening without dominant nodule. Normal right adrenal gland. Bilateral renal parenchymal atrophy with lobulated contours. Probable small cyst in the mid left kidney. No hydronephrosis. Urinary bladder is near completely empty. Stomach/Bowel: Please note there is high-density material throughout the stomach, small and large bowel. This limits detection of acute contrast extravasation. There is no obvious localization of contrast in the GI tract to localize site of GI bleed, evaluations significantly limited. There is no bowel inflammation or wall  thickening. Normal appendix. Lymphatic: No abdominopelvic adenopathy. Reproductive: Status post hysterectomy. No adnexal masses. Other: Small amount of free fluid in the dependent pelvis. No free air. Small fat containing umbilical hernia. Musculoskeletal: Bones are diffusely under mineralized. There are no acute or suspicious osseous abnormalities. IMPRESSION: 1. Please note  assessment for contrast extravasation in the GI track is limited by high density material throughout the entire bowel. Allowing for this limitation, there is no obvious localization of GI bleed. No evidence of acute bowel inflammation. 2. Advanced aortic and branch atherosclerosis with predominantly calcified plaque. No evidence of acute vascular finding. Areas of branch vessel stenosis as described above. 3. Cardiomegaly with contrast refluxing into the hepatic veins and IVC suggesting elevated right heart pressures. Probable pulmonary edema at the lung bases. Trace left pleural effusion. 4. Indeterminate lesion in the right lobe of the liver. This was faintly seen on prior noncontrast exam. Consider further evaluation with hepatic protocol MRI after resolution of acute event. Aortic Atherosclerosis (ICD10-I70.0). Electronically Signed   By: Keith Rake M.D.   On: 05/22/2020 18:21     Scheduled Meds: . sodium chloride   Intravenous Once  . Chlorhexidine Gluconate Cloth  6 each Topical Q0600  . insulin aspart  0-6 Units Subcutaneous TID WC  . mupirocin ointment  1 application Nasal BID   Continuous Infusions: . pantoprozole (PROTONIX) infusion 8 mg/hr (05/23/20 0238)     LOS: 0 days    Time spent: 100  Nita Sells, MD [facility] To contact the attending provider between 7A-7P or the covering provider during after hours 7P-7A, please log into the web site [email or web address] and access using universal [place] password for that web site. If you do not have the password, please call the hospital operator.  05/23/2020, 7:32 AM

## 2020-05-23 NOTE — Progress Notes (Addendum)
Daily Rounding Note  05/23/2020, 11:23 AM  LOS: 0 days   SUBJECTIVE:   Chief complaint: Acute on chronic blood loss anemia.  Melena   Passing flatus but no stools today.  Some discomfort on the right abdomen.  No nausea or vomiting.  Today's dialysis day, has not been dialyzed yet.  OBJECTIVE:         Vital signs in last 24 hours:    Temp:  [97.9 F (36.6 C)-99.3 F (37.4 C)] 98.3 F (36.8 C) (11/03 0825) Pulse Rate:  [63-94] 73 (11/03 0825) Resp:  [11-24] 14 (11/03 0825) BP: (73-113)/(27-56) 97/38 (11/03 0825) SpO2:  [89 %-100 %] 96 % (11/03 0825) Weight:  [00 kg-74 kg] 74 kg (11/02 2248) Last BM Date: 05/22/20 Filed Weights   05/22/20 1300 05/22/20 2248  Weight: 63 kg 74 kg   General: Looks the same, stable, comfortable, chronically ill, obese Heart: RRR. Chest: No labored breathing or cough.  Clear bilaterally Abdomen: Obese, soft.  Mild to moderate right-sided tenderness without guarding or rebound.  Active bowel sounds.  No distention Extremities: Bilateral AKA, sites well-healed. Neuro/Psych: Pleasant, calm, cooperative.  Fluid speech.  Fully alert and oriented.  Blind.  Intake/Output from previous day: 11/02 0701 - 11/03 0700 In: 1156.8 [P.O.:120; I.V.:91.8; Blood:945] Out: -   Intake/Output this shift: Total I/O In: 100 [P.O.:100] Out: -   Lab Results: Recent Labs    05/22/20 1323 05/22/20 1323 05/22/20 1414 05/22/20 2200 05/23/20 0050  WBC 8.9  --   --  7.1 9.0  HGB 5.1*   < > 6.1* 7.6* 8.5*  HCT 17.7*   < > 18.0* 24.1* 26.0*  PLT 190  --   --  160 228   < > = values in this interval not displayed.   BMET Recent Labs    05/22/20 1323 05/22/20 1414 05/23/20 0050  NA 139 139 136  K 5.1 4.5 4.8  CL 100 100 96*  CO2 25  --  25  GLUCOSE 113* 103* 137*  BUN 30* 37* 36*  CREATININE 6.40* 6.70* 7.30*  CALCIUM 8.0*  --  8.4*   LFT Recent Labs    05/22/20 1323 05/23/20 0050  PROT  5.5* 5.8*  ALBUMIN 2.7* 2.9*  AST 53* 18  ALT 20 14  ALKPHOS 54 59  BILITOT 1.0 0.9   PT/INR Recent Labs    05/22/20 2200 05/23/20 0050  LABPROT 17.5* 15.2  INR 1.5* 1.2   Hepatitis Panel No results for input(s): HEPBSAG, HCVAB, HEPAIGM, HEPBIGM in the last 72 hours.  Studies/Results: CT Angio Abd/Pel w/ and/or w/o  Result Date: 05/22/2020 CLINICAL DATA:  GI bleed with hemoglobin drop. EXAM: CTA ABDOMEN AND PELVIS WITHOUT AND WITH CONTRAST TECHNIQUE: Multidetector CT imaging of the abdomen and pelvis was performed using the standard protocol during bolus administration of intravenous contrast. Multiplanar reconstructed images and MIPs were obtained and reviewed to evaluate the vascular anatomy. CONTRAST:  116mL OMNIPAQUE IOHEXOL 350 MG/ML SOLN COMPARISON:  Most recent abdominal CT 08/28/2016 FINDINGS: VASCULAR Aorta: Moderately advanced atherosclerosis with primarily calcified plaque. No dissection or aneurysm. No severe stenosis. Celiac: Minimal stenosis at the origin due to calcified plaque. No poststenotic dilatation. Branch vessels are moderately calcified. No dissection or acute findings. SMA: Mixed calcified noncalcified atheromatous plaque with irregular plaque involving the proximal SMA. Distal branches are densely calcified. No evidence of severe stenosis or propped occlusion. Renals: Calcified plaque at the origin of both renal arteries  with mild stenosis. Peripheral branches are patent but calcified. IMA: Stenotic at the origin with mild hypertrophy distally. Inflow: Densely calcified without severe stenosis or acute findings. Proximal Outflow: Densely calcified. Irregular calcifications project into the lumen of the proximal right superficial femoral artery causing greater than 50% stenosis. Veins: Portal venous phase obtained, portal vein and mesenteric vessels are patent. No evidence of acute thrombus. Limited assessment of the IVC and iliac vasculature due to contrast bolus  timing. Review of the MIP images confirms the above findings. NON-VASCULAR Lower chest: Multi chamber cardiomegaly. Contrast refluxes into the hepatic veins and IVC consistent with elevated right heart pressures. Ground-glass densities in the lung bases may represent pulmonary edema. Trace left pleural effusion. Hepatobiliary: The liver is enlarged spanning 22 cm cranial caudal. There is subcapsular low-density involving the right lobe measuring approximately 3.5 cm best appreciated on portal venous phase imaging. Slight capsular retraction. This is not faintly visualized on prior exam. Decompressed gallbladder. No calcified gallstone. Pancreas: Parenchymal atrophy. No ductal dilatation or inflammation. Spleen: Normal in size without focal abnormality. Small splenule inferiorly. Adrenals/Urinary Tract: Left adrenal thickening without dominant nodule. Normal right adrenal gland. Bilateral renal parenchymal atrophy with lobulated contours. Probable small cyst in the mid left kidney. No hydronephrosis. Urinary bladder is near completely empty. Stomach/Bowel: Please note there is high-density material throughout the stomach, small and large bowel. This limits detection of acute contrast extravasation. There is no obvious localization of contrast in the GI tract to localize site of GI bleed, evaluations significantly limited. There is no bowel inflammation or wall thickening. Normal appendix. Lymphatic: No abdominopelvic adenopathy. Reproductive: Status post hysterectomy. No adnexal masses. Other: Small amount of free fluid in the dependent pelvis. No free air. Small fat containing umbilical hernia. Musculoskeletal: Bones are diffusely under mineralized. There are no acute or suspicious osseous abnormalities. IMPRESSION: 1. Please note assessment for contrast extravasation in the GI track is limited by high density material throughout the entire bowel. Allowing for this limitation, there is no obvious localization of GI  bleed. No evidence of acute bowel inflammation. 2. Advanced aortic and branch atherosclerosis with predominantly calcified plaque. No evidence of acute vascular finding. Areas of branch vessel stenosis as described above. 3. Cardiomegaly with contrast refluxing into the hepatic veins and IVC suggesting elevated right heart pressures. Probable pulmonary edema at the lung bases. Trace left pleural effusion. 4. Indeterminate lesion in the right lobe of the liver. This was faintly seen on prior noncontrast exam. Consider further evaluation with hepatic protocol MRI after resolution of acute event. Aortic Atherosclerosis (ICD10-I70.0). Electronically Signed   By: Keith Rake M.D.   On: 05/22/2020 18:21    ASSESMENT:   *   Acute on chronic blood loss anemia w melena. Previous endoscopies w erosive gastropathy, nonbleeding duodenal and jejunal AVMs all of which were treated with APC fulguration. At colonoscopy 03/2018 multiple tubular adenomatous polyps and a hyperplastic polyp were removed. Suspect she is bleeding from AVMs in the upper GI tract. MCV is macrocytic.  Iron, B12 and folate studies are all pending.Marland Kitchen Has been transfused on multiple occasions, most recently during admission with GI bleed in 12/2019. Hgb 5.1 >> 2 PRBCs >> 8.5 CTAP with angio.  Somewhat limited study but no localized bleeding evident.  *   Indeterminate lesion right lower lobe of liver, faintly seen on CT.  Consider further evaluation with nonurgent, hepatic protocol MRI  *  ESRD.  On HD MWF.  *    PVD.  Status post  bil AKA.  Has not yet received her own personal prosthetics and remains nonambulatory.    PLAN   *   EGD and colonoscopy tomorrow.   Dulcolax now.  Split dose movie prep begins this evening.  Continue clears. Went ahead and M stopping the Protonix drip once the current bag finishes infusing.  Begin Protonix 40 IV bid tonight.  As there is not high concern for ulcers as cause of bleeding.  *    Defer  decision regarding MRI for liver lesion eval for future date.  *    CBC in the morning.   Azucena Freed  05/23/2020, 11:23 AM Phone [phone]     Attending physician's note   I have taken an interval history, reviewed the chart and examined the patient. I agree with the Advanced Practitioner's note, impression and recommendations.   CTA neg for active bleeding HD today No further bleeding. Hb 8.5 after 2U  Plan for enteroscopy/colonoscopy 11/4 Trend CBC. Continue IV Protonix.   Carmell Austria, MD [facility]

## 2020-05-23 NOTE — Anesthesia Preprocedure Evaluation (Addendum)
Anesthesia Evaluation  Patient identified by MRN, date of birth, ID band Patient awake    Reviewed: Allergy & Precautions, H&P , NPO status , Patient's Chart, lab work & pertinent test results  Airway Mallampati: II  TM Distance: >3 FB Neck ROM: Full    Dental no notable dental hx. (+) Teeth Intact, Dental Advisory Given   Pulmonary asthma ,    Pulmonary exam normal breath sounds clear to auscultation       Cardiovascular Exercise Tolerance: Good hypertension, Pt. on medications and Pt. on home beta blockers + Peripheral Vascular Disease and +CHF  + Valvular Problems/Murmurs AS and MR  Rhythm:Regular Rate:Normal     Neuro/Psych  Headaches, Anxiety    GI/Hepatic Neg liver ROS, PUD, GERD  Medicated,  Endo/Other  diabetes, Insulin Dependent  Renal/GU ESRFRenal disease  negative genitourinary   Musculoskeletal  (+) Arthritis , Fibromyalgia -  Abdominal   Peds  Hematology  (+) Blood dyscrasia, anemia ,   Anesthesia Other Findings   Reproductive/Obstetrics negative OB ROS                            Anesthesia Physical Anesthesia Plan  ASA: III  Anesthesia Plan: MAC   Post-op Pain Management:    Induction: Intravenous  PONV Risk Score and Plan: 2 and Propofol infusion and Treatment may vary due to age or medical condition  Airway Management Planned: Nasal Cannula  Additional Equipment:   Intra-op Plan:   Post-operative Plan:   Informed Consent: I have reviewed the patients History and Physical, chart, labs and discussed the procedure including the risks, benefits and alternatives for the proposed anesthesia with the patient or authorized representative who has indicated his/her understanding and acceptance.     Dental advisory given  Plan Discussed with: CRNA  Anesthesia Plan Comments:        Anesthesia Quick Evaluation

## 2020-05-23 NOTE — H&P (View-Only) (Signed)
Daily Rounding Note  05/23/2020, 11:23 AM  LOS: 0 days   SUBJECTIVE:   Chief complaint: Acute on chronic blood loss anemia.  Melena   Passing flatus but no stools today.  Some discomfort on the right abdomen.  No nausea or vomiting.  Today's dialysis day, has not been dialyzed yet.  OBJECTIVE:         Vital signs in last 24 hours:    Temp:  [97.9 F (36.6 C)-99.3 F (37.4 C)] 98.3 F (36.8 C) (11/03 0825) Pulse Rate:  [63-94] 73 (11/03 0825) Resp:  [11-24] 14 (11/03 0825) BP: (73-113)/(27-56) 97/38 (11/03 0825) SpO2:  [89 %-100 %] 96 % (11/03 0825) Weight:  [43 kg-74 kg] 74 kg (11/02 2248) Last BM Date: 05/22/20 Filed Weights   05/22/20 1300 05/22/20 2248  Weight: 63 kg 74 kg   General: Looks the same, stable, comfortable, chronically ill, obese Heart: RRR. Chest: No labored breathing or cough.  Clear bilaterally Abdomen: Obese, soft.  Mild to moderate right-sided tenderness without guarding or rebound.  Active bowel sounds.  No distention Extremities: Bilateral AKA, sites well-healed. Neuro/Psych: Pleasant, calm, cooperative.  Fluid speech.  Fully alert and oriented.  Blind.  Intake/Output from previous day: 11/02 0701 - 11/03 0700 In: 1156.8 [P.O.:120; I.V.:91.8; Blood:945] Out: -   Intake/Output this shift: Total I/O In: 100 [P.O.:100] Out: -   Lab Results: Recent Labs    05/22/20 1323 05/22/20 1323 05/22/20 1414 05/22/20 2200 05/23/20 0050  WBC 8.9  --   --  7.1 9.0  HGB 5.1*   < > 6.1* 7.6* 8.5*  HCT 17.7*   < > 18.0* 24.1* 26.0*  PLT 190  --   --  160 228   < > = values in this interval not displayed.   BMET Recent Labs    05/22/20 1323 05/22/20 1414 05/23/20 0050  NA 139 139 136  K 5.1 4.5 4.8  CL 100 100 96*  CO2 25  --  25  GLUCOSE 113* 103* 137*  BUN 30* 37* 36*  CREATININE 6.40* 6.70* 7.30*  CALCIUM 8.0*  --  8.4*   LFT Recent Labs    05/22/20 1323 05/23/20 0050  PROT  5.5* 5.8*  ALBUMIN 2.7* 2.9*  AST 53* 18  ALT 20 14  ALKPHOS 54 59  BILITOT 1.0 0.9   PT/INR Recent Labs    05/22/20 2200 05/23/20 0050  LABPROT 17.5* 15.2  INR 1.5* 1.2   Hepatitis Panel No results for input(s): HEPBSAG, HCVAB, HEPAIGM, HEPBIGM in the last 72 hours.  Studies/Results: CT Angio Abd/Pel w/ and/or w/o  Result Date: 05/22/2020 CLINICAL DATA:  GI bleed with hemoglobin drop. EXAM: CTA ABDOMEN AND PELVIS WITHOUT AND WITH CONTRAST TECHNIQUE: Multidetector CT imaging of the abdomen and pelvis was performed using the standard protocol during bolus administration of intravenous contrast. Multiplanar reconstructed images and MIPs were obtained and reviewed to evaluate the vascular anatomy. CONTRAST:  116mL OMNIPAQUE IOHEXOL 350 MG/ML SOLN COMPARISON:  Most recent abdominal CT 08/28/2016 FINDINGS: VASCULAR Aorta: Moderately advanced atherosclerosis with primarily calcified plaque. No dissection or aneurysm. No severe stenosis. Celiac: Minimal stenosis at the origin due to calcified plaque. No poststenotic dilatation. Branch vessels are moderately calcified. No dissection or acute findings. SMA: Mixed calcified noncalcified atheromatous plaque with irregular plaque involving the proximal SMA. Distal branches are densely calcified. No evidence of severe stenosis or propped occlusion. Renals: Calcified plaque at the origin of both renal arteries  with mild stenosis. Peripheral branches are patent but calcified. IMA: Stenotic at the origin with mild hypertrophy distally. Inflow: Densely calcified without severe stenosis or acute findings. Proximal Outflow: Densely calcified. Irregular calcifications project into the lumen of the proximal right superficial femoral artery causing greater than 50% stenosis. Veins: Portal venous phase obtained, portal vein and mesenteric vessels are patent. No evidence of acute thrombus. Limited assessment of the IVC and iliac vasculature due to contrast bolus  timing. Review of the MIP images confirms the above findings. NON-VASCULAR Lower chest: Multi chamber cardiomegaly. Contrast refluxes into the hepatic veins and IVC consistent with elevated right heart pressures. Ground-glass densities in the lung bases may represent pulmonary edema. Trace left pleural effusion. Hepatobiliary: The liver is enlarged spanning 22 cm cranial caudal. There is subcapsular low-density involving the right lobe measuring approximately 3.5 cm best appreciated on portal venous phase imaging. Slight capsular retraction. This is not faintly visualized on prior exam. Decompressed gallbladder. No calcified gallstone. Pancreas: Parenchymal atrophy. No ductal dilatation or inflammation. Spleen: Normal in size without focal abnormality. Small splenule inferiorly. Adrenals/Urinary Tract: Left adrenal thickening without dominant nodule. Normal right adrenal gland. Bilateral renal parenchymal atrophy with lobulated contours. Probable small cyst in the mid left kidney. No hydronephrosis. Urinary bladder is near completely empty. Stomach/Bowel: Please note there is high-density material throughout the stomach, small and large bowel. This limits detection of acute contrast extravasation. There is no obvious localization of contrast in the GI tract to localize site of GI bleed, evaluations significantly limited. There is no bowel inflammation or wall thickening. Normal appendix. Lymphatic: No abdominopelvic adenopathy. Reproductive: Status post hysterectomy. No adnexal masses. Other: Small amount of free fluid in the dependent pelvis. No free air. Small fat containing umbilical hernia. Musculoskeletal: Bones are diffusely under mineralized. There are no acute or suspicious osseous abnormalities. IMPRESSION: 1. Please note assessment for contrast extravasation in the GI track is limited by high density material throughout the entire bowel. Allowing for this limitation, there is no obvious localization of GI  bleed. No evidence of acute bowel inflammation. 2. Advanced aortic and branch atherosclerosis with predominantly calcified plaque. No evidence of acute vascular finding. Areas of branch vessel stenosis as described above. 3. Cardiomegaly with contrast refluxing into the hepatic veins and IVC suggesting elevated right heart pressures. Probable pulmonary edema at the lung bases. Trace left pleural effusion. 4. Indeterminate lesion in the right lobe of the liver. This was faintly seen on prior noncontrast exam. Consider further evaluation with hepatic protocol MRI after resolution of acute event. Aortic Atherosclerosis (ICD10-I70.0). Electronically Signed   By: Keith Rake M.D.   On: 05/22/2020 18:21    ASSESMENT:   *   Acute on chronic blood loss anemia w melena. Previous endoscopies w erosive gastropathy, nonbleeding duodenal and jejunal AVMs all of which were treated with APC fulguration. At colonoscopy 03/2018 multiple tubular adenomatous polyps and a hyperplastic polyp were removed. Suspect she is bleeding from AVMs in the upper GI tract. MCV is macrocytic.  Iron, B12 and folate studies are all pending.Marland Kitchen Has been transfused on multiple occasions, most recently during admission with GI bleed in 12/2019. Hgb 5.1 >> 2 PRBCs >> 8.5 CTAP with angio.  Somewhat limited study but no localized bleeding evident.  *   Indeterminate lesion right lower lobe of liver, faintly seen on CT.  Consider further evaluation with nonurgent, hepatic protocol MRI  *  ESRD.  On HD MWF.  *    PVD.  Status post  bil AKA.  Has not yet received her own personal prosthetics and remains nonambulatory.    PLAN   *   EGD and colonoscopy tomorrow.   Dulcolax now.  Split dose movie prep begins this evening.  Continue clears. Went ahead and M stopping the Protonix drip once the current bag finishes infusing.  Begin Protonix 40 IV bid tonight.  As there is not high concern for ulcers as cause of bleeding.  *    Defer  decision regarding MRI for liver lesion eval for future date.  *    CBC in the morning.   Azucena Freed  05/23/2020, 11:23 AM Phone [phone]     Attending physician's note   I have taken an interval history, reviewed the chart and examined the patient. I agree with the Advanced Practitioner's note, impression and recommendations.   CTA neg for active bleeding HD today No further bleeding. Hb 8.5 after 2U  Plan for enteroscopy/colonoscopy 11/4 Trend CBC. Continue IV Protonix.   Carmell Austria, MD [facility]

## 2020-05-23 NOTE — Consult Note (Signed)
[facility] Renal Consultation Note    Indication for Consultation:  Management of ESRD/hemodialysis, anemia, hypertension/volume, and secondary hyperparathyroidism.  HPI: [name] is a 69 y.o. female with past medical history including ESRD on dialysis Monday Wednesday Friday, bilateral AKA's, insulin-dependent diabetes mellitus, hypertension, CHF, and GERD, who presented to the ED after syncope after bathroom use.  Reports she had been feeling weak during her last 2 dialysis sessions.  She also had nausea, vomiting and generalized abdominal pain on 05/21/2020.  Reported dark stools.  Labs on arrival but were notable for hemoglobin 5.1, white blood cell 8.9, platelets 190, potassium 5.1, creatinine 6.4, BUN 30, albumin 2.7.  T sat was 66 with ferritin over 2000, however appears use labs were done after blood transfusion.  She received 2 units packed red blood cells.  She is planned for CTA with likely inpatient endoscopy/colonoscopy on 05/24/2020.  She is currently on IV Protonix.  She dialyzes on Monday Wednesday Friday schedule and has been compliant with her dialysis.  Her last hemoglobin was 8 on 05/16/20 compared to 9.9 on 05/09/20. She received mircera 124mcg on 10/25.  Patient reports she is feeling slightly better now after her blood transfusion.  She denies shortness of breath, chest pain, palpitations, dizziness.  Reports she is on O2 2 L at home.  Blood pressure is soft this morning and antihypertensive medications are currently on hold.  Past Medical History:  Diagnosis Date  . Anemia   . Anxiety   . Arthritis    "joints" (06/15/2013)  . Asthma   . 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 Blood transfusion without reported diagnosis   . Breast cancer ([place])    left  . CHF (congestive heart failure) ([place])   . 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 Esophageal ulcer with bleeding   . ESRD (end stage renal disease) ([place])    "suppose to start  dialysis today" (06/15/2013)  . Family history of adverse reaction to anesthesia    It took a while for pt sister to wake from anesthesia  . GERD (gastroesophageal reflux disease)   . Glaucoma    blind in both eyes  . Heart murmur    Mild AS, moderate MR, moderate TR 10/30/16 echo  . Hx of adenomatous colonic polyps 04/07/2018  . Hypertension   . Myalgia 12/31/2011  . Neuropathy 12/31/2011  . PAD (peripheral artery disease) ([facility])    nonviable tissue left lower extremity  . Shortness of breath    "when she doesn't go to dialysis"  . Type II diabetes mellitus ([place])    Type II   Past Surgical History:  Procedure Laterality Date  . A/V FISTULAGRAM N/A 09/21/2018   Procedure: A/V FISTULAGRAM - Right Upper;  Surgeon: [name], MD;  Location: [place] [facility];  Service: Cardiovascular;  Laterality: N/A;  . ABDOMINAL AORTOGRAM N/A 11/30/2018   Procedure: ABDOMINAL AORTOGRAM;  Surgeon: [name], MD;  Location: [place] [facility];  Service: Cardiovascular;  Laterality: N/A;  . ABDOMINAL AORTOGRAM W/LOWER EXTREMITY Left 07/29/2019   Procedure: ABDOMINAL AORTOGRAM W/LOWER EXTREMITY;  Surgeon: [name], MD;  Location: [place] [facility];  Service: Cardiovascular;  Laterality: Left;  . ABDOMINAL HYSTERECTOMY     partial  . AMPUTATION Left 08/30/2019   Procedure: AMPUTATION BELOW KNEE LEFT;  Surgeon: [name], MD;  Location: [facility] OR;  Service: Vascular;  Laterality: Left;  . AMPUTATION Left  10/21/2019   Procedure: Amputation Above Knee;  Surgeon: Rosetta Posner, MD;  Location: [facility] OR;  Service: Vascular;  Laterality: Left;  . AMPUTATION Right 11/17/2019   Procedure: AMPUTATION ABOVE KNEE, right leg;  Surgeon: Rosetta Posner, MD;  Location: [place];  Service: Vascular;  Laterality: Right;  . AV FISTULA PLACEMENT Right 06/06/2013   Procedure: ARTERIOVENOUS (AV) FISTULA CREATION-RIGHT BRACHIAL CEPHALIC;  Surgeon: Conrad [place], MD;  Location: [place];  Service: Vascular;  Laterality:  Right;  . BREAST BIOPSY Left   . BREAST LUMPECTOMY Left    "and took out some lymph nodes" (06/15/2013)  . CARDIAC CATHETERIZATION     04/02/15 [facility]): no angiographic CAD, LVEF 40% with global hypokinesis (LHC done for + stress echo, EF 45% 11/28/14)  . CARPAL TUNNEL RELEASE Left 11/26/2017   Procedure: LEFT CARPAL TUNNEL RELEASE;  Surgeon: [name], MD;  Location: [place];  Service: Orthopedics;  Laterality: Left;  . CATARACT EXTRACTION W/ ANTERIOR VITRECTOMY Bilateral   . CESAREAN SECTION  1980  . COLONOSCOPY    . ENTEROSCOPY N/A 12/18/2019   Procedure: ENTEROSCOPY;  Surgeon: Mansouraty, Telford Nab., MD;  Location: [place];  Service: Gastroenterology;  Laterality: N/A;  . ESOPHAGOGASTRODUODENOSCOPY N/A 07/08/2017   Procedure: ESOPHAGOGASTRODUODENOSCOPY (EGD);  Surgeon: [name], MD;  Location: [facility] ENDOSCOPY;  Service: Endoscopy;  Laterality: N/A;  . ESOPHAGOGASTRODUODENOSCOPY (EGD) WITH PROPOFOL N/A 02/07/2018   Procedure: ESOPHAGOGASTRODUODENOSCOPY (EGD) WITH PROPOFOL;  Surgeon: [name], MD;  Location: [facility] ENDOSCOPY;  Service: Gastroenterology;  Laterality: N/A;  APC and clips placed  . EYE SURGERY Bilateral    laser surgery  . GIVENS CAPSULE STUDY N/A 12/19/2019   Procedure: GIVENS CAPSULE STUDY;  Surgeon: Irving Copas., MD;  Location: [place];  Service: Gastroenterology;  Laterality: N/A;  . HOT HEMOSTASIS N/A 12/18/2019   Procedure: HOT HEMOSTASIS (ARGON PLASMA COAGULATION/BICAP);  Surgeon: Irving Copas., MD;  Location: [place];  Service: Gastroenterology;  Laterality: N/A;  . LOWER EXTREMITY ANGIOGRAPHY Bilateral 11/30/2018   Procedure: LOWER EXTREMITY ANGIOGRAPHY;  Surgeon: [name], MD;  Location: [place] [facility];  Service: Cardiovascular;  Laterality: Bilateral;  . PARS PLANA VITRECTOMY Right 05/05/2017   Procedure: PARS PLANA VITRECTOMY WITH 25 GAUGE; PARTIAL REMOVAL OF OIL; INFERIOR PERIPHERAL IRIDECTOMY, REFORM  ANTERIOR CHAMBER RIGHT EYE;  Surgeon: [name], MD;  Location: [place];  Service: Ophthalmology;  Laterality: Right;  . PERIPHERAL VASCULAR BALLOON ANGIOPLASTY Right 09/21/2018   Procedure: PERIPHERAL VASCULAR BALLOON ANGIOPLASTY;  Surgeon: [name], MD;  Location: [place] [facility];  Service: Cardiovascular;  Laterality: Right;  AV fistula  . PERIPHERAL VASCULAR BALLOON ANGIOPLASTY Left 11/30/2018   Procedure: PERIPHERAL VASCULAR BALLOON ANGIOPLASTY;  Surgeon: [name], MD;  Location: [place] [facility];  Service: Cardiovascular;  Laterality: Left;  Left Anterior Tibial Artery  . REFRACTIVE SURGERY Bilateral   . REMOVAL OF A DIALYSIS CATHETER Right 06/06/2013   Procedure: REMOVAL OF RIGHT MEDIPORT;  Surgeon: Conrad , MD;  Location: [place];  Service: Vascular;  Laterality: Right;  . SUBMUCOSAL TATTOO INJECTION  12/18/2019   Procedure: SUBMUCOSAL TATTOO INJECTION;  Surgeon: Irving Copas., MD;  Location: [place];  Service: Gastroenterology;;  . TONSILLECTOMY    . UPPER GASTROINTESTINAL ENDOSCOPY    . WOUND DEBRIDEMENT Left 09/30/2019   Procedure: IRRIGATION AND DEBRIDEMENT WOUND OF LEFT BELOW KNEE AMPUTATION;  Surgeon: [name], MD;  Location: [place];  Service: Vascular;  Laterality: Left;  Family History  Problem Relation Age of Onset  . Diabetes Mother   . Hyperlipidemia Mother   . Hypertension Mother   . Hypertension Father   . Diabetes Sister   . Diabetes Brother   . Hypertension Brother   . Heart attack Brother   . Kidney disease Brother   . Colon cancer Neg Hx   . Colon polyps Neg Hx   . Esophageal cancer Neg Hx   . Gallbladder disease Neg Hx   . Rectal cancer Neg Hx   . Stomach cancer Neg Hx    Social History:  reports that she has never smoked. She has never used smokeless tobacco. She reports that she does not drink alcohol and does not use drugs.  ROS: As per HPI otherwise negative.  Physical Exam: Vitals:   05/23/20 0030  05/23/20 0441 05/23/20 0600 05/23/20 0825  BP: (!) 103/44 (!) 108/41 (!) 109/45 (!) 97/38  Pulse: 74 74  73  Resp: 18 14  14   Temp: 99 F (37.2 C) 98.6 F (37 C)  98.3 F (36.8 C)  TempSrc: Oral Oral  Oral  SpO2: 97% 98%  96%  Weight:      Height:         General: Well developed, alert, in no acute distress. Head: Normocephalic, atraumatic, sclera non-icteric, mucus membranes are moist. Neck: JVD not elevated. Lungs: Clear bilaterally to auscultation without wheezes, rales, or rhonchi. Breathing is unlabored. Heart: RRR  + systolic murmur Abdomen: Soft, non-tender, non-distended with normoactive bowel sounds. No rebound/guarding. No obvious abdominal masses. Lower extremities: B/l AKA, No edema or ischemic changes, no open wounds. Neuro: Alert and oriented X 3. Moves all extremities spontaneously. Psych:  Responds to questions appropriately with a normal affect. Dialysis Access: RUE AVF + t/b  Allergies  Allergen Reactions  . Tape Itching and Rash    88M Transpore adhesive tape. Medical tape pulls off the skin!! PAPER TAPE ONLY, PLEASE  . Latex Hives  . Oxycodone Other (See Comments)    Hallucinations   . Tramadol Other (See Comments)    Hallucinations with a full tablet  . Morphine And Related Other (See Comments)    Hallucinations   . Vicodin [Hydrocodone-Acetaminophen] Other (See Comments)    Hallucinations    Prior to Admission medications   Medication Sig Start Date End Date Taking? Authorizing Provider  albuterol (PROVENTIL) (2.5 MG/88ML) 0.083% nebulizer solution Take 3 mLs (2.5 mg total) by nebulization every 6 (six) hours as needed for wheezing or shortness of breath. 10/30/16  Yes Theodis Blaze, MD  albuterol (VENTOLIN HFA) 108 (90 Base) MCG/ACT inhaler Inhale 2 puffs into the lungs every 6 (six) hours as needed for wheezing or shortness of breath.    Yes [provider]  aspirin EC 81 MG tablet Take 81 mg by mouth daily.   Yes [provider]   atropine 1 % ophthalmic solution Place 1 drop into the right eye 2 (two) times daily. 07/04/17  Yes [provider]  BD PEN NEEDLE NANO 2ND GEN 32G X 4 MM MISC in the morning, at noon, and at bedtime.  03/03/19  Yes [provider]  darbepoetin (ARANESP) 200 MCG/0.4ML SOLN injection Inject 0.4 mLs (200 mcg total) into the vein every Wednesday with hemodialysis. 06/22/13  Yes Eulogio Bear U, DO  diclofenac sodium (VOLTAREN) 1 % GEL Apply 2 g topically 4 (four) times daily as needed (pain).  04/28/19  Yes [provider]  diphenhydrAMINE (BENADRYL) 25 MG  tablet Take 25 mg by mouth in the morning, at noon, and at bedtime.    Yes [provider]  gabapentin (NEURONTIN) 100 MG capsule Take 100 mg by mouth 2 (two) times daily.   Yes [provider]  insulin aspart (NOVOLOG FLEXPEN) 100 UNIT/ML FlexPen Inject 2-10 Units into the skin 2 (two) times daily with a meal.    Yes [provider]  lanthanum (FOSRENOL) 1000 MG chewable tablet Chew 2,000-4,000 mg by mouth See admin instructions. Chew 4,000 mg by mouth with each meal and 2,000 mg with each snack   Yes [provider]  multivitamin (RENA-VIT) TABS tablet Take 1 tablet by mouth daily. 12/20/19  Yes [provider]  omeprazole (PRILOSEC) 40 MG capsule Take 1 capsule (40 mg total) by mouth daily. Patient taking differently: Take 40 mg by mouth 2 (two) times daily.  02/09/18  Yes Cherene Altes, MD  [facility] VERIO test strip 1 each by Other route 3 (three) times daily.  12/15/17  Yes [provider]  prednisoLONE acetate (PRED FORTE) 1 % ophthalmic suspension Place 1 drop into the right eye 2 (two) times daily.  04/27/17  Yes [provider]  carvedilol (COREG) 25 MG tablet Take 0.5 tablets (12.5 mg total) by mouth 2 (two) times daily. 10/24/19 12/16/19  Alma Friendly, MD  carvedilol (COREG) 6.25 MG tablet Take 1 tablet (6.25 mg total) by mouth 2 (two) times daily.  12/20/19 01/19/20  Guilford Shi, MD  doxercalciferol (HECTOROL) 4 MCG/2ML injection Inject 0.5 mLs (1 mcg total) into the vein every Monday, Wednesday, and Friday with hemodialysis. Patient not taking: Reported on 05/22/2020 06/18/13   Geradine Girt, DO  traMADol (ULTRAM) 50 MG tablet Take 1 tablet (50 mg total) by mouth every 6 (six) hours as needed. Patient not taking: Reported on 05/22/2020 12/12/19   Dagoberto Ligas, PA-C   Current Facility-Administered Medications  Medication Dose Route Frequency Provider Last Rate Last Admin  . 0.9 %  sodium chloride infusion (Manually program via Guardrails IV Fluids)   Intravenous Once Cox, Amy N, DO      . Chlorhexidine Gluconate Cloth 2 % PADS 6 each  6 each Topical Q0600 Cox, Amy N, DO   6 each at 05/23/20 0649  . insulin aspart (novoLOG) injection 0-6 Units  0-6 Units Subcutaneous TID WC Cox, Amy N, DO      . mupirocin ointment (BACTROBAN) 2 % 1 application  1 application Nasal BID Cox, Amy N, DO      . pantoprazole (PROTONIX) 80 mg in sodium chloride 0.9 % 100 mL (0.8 mg/mL) infusion  8 mg/hr Intravenous Continuous Cox, Amy N, DO 10 mL/hr at 05/23/20 0238 8 mg/hr at 05/23/20 0238   Labs: Basic Metabolic Panel: Recent Labs  Lab 05/22/20 1323 05/22/20 1414 05/23/20 0050  NA 139 139 136  K 5.1 4.5 4.8  CL 100 100 96*  CO2 25  --  25  GLUCOSE 113* 103* 137*  BUN 30* 37* 36*  CREATININE 6.40* 6.70* 7.30*  CALCIUM 8.0*  --  8.4*   Liver Function Tests: Recent Labs  Lab 05/22/20 1323 05/23/20 0050  AST 53* 18  ALT 20 14  ALKPHOS 54 59  BILITOT 1.0 0.9  PROT 5.5* 5.8*  ALBUMIN 2.7* 2.9*   CBC: Recent Labs  Lab 05/22/20 1323 05/22/20 1323 05/22/20 1414 05/22/20 2200 05/23/20 0050  WBC 8.9  --   --  7.1 9.0  NEUTROABS 6.5  --   --   --   --  HGB 5.1*   < > 6.1* 7.6* 8.5*  HCT 17.7*   < > 18.0* 24.1* 26.0*  MCV 111.3*  --   --  98.4 96.3  PLT 190  --   --  160 228   < > = values in this interval not displayed.    CBG: Recent Labs  Lab 05/22/20 2251 05/23/20 0822  GLUCAP 154* 137*   Iron Studies:  Recent Labs    05/23/20 0050  IRON 162  TIBC 245*  FERRITIN 2,222*   Studies/Results: CT Angio Abd/Pel w/ and/or w/o  Result Date: 05/22/2020 CLINICAL DATA:  GI bleed with hemoglobin drop. EXAM: CTA ABDOMEN AND PELVIS WITHOUT AND WITH CONTRAST TECHNIQUE: Multidetector CT imaging of the abdomen and pelvis was performed using the standard protocol during bolus administration of intravenous contrast. Multiplanar reconstructed images and MIPs were obtained and reviewed to evaluate the vascular anatomy. CONTRAST:  157mL OMNIPAQUE IOHEXOL 350 MG/ML SOLN COMPARISON:  Most recent abdominal CT 08/28/2016 FINDINGS: VASCULAR Aorta: Moderately advanced atherosclerosis with primarily calcified plaque. No dissection or aneurysm. No severe stenosis. Celiac: Minimal stenosis at the origin due to calcified plaque. No poststenotic dilatation. Branch vessels are moderately calcified. No dissection or acute findings. SMA: Mixed calcified noncalcified atheromatous plaque with irregular plaque involving the proximal SMA. Distal branches are densely calcified. No evidence of severe stenosis or propped occlusion. Renals: Calcified plaque at the origin of both renal arteries with mild stenosis. Peripheral branches are patent but calcified. IMA: Stenotic at the origin with mild hypertrophy distally. Inflow: Densely calcified without severe stenosis or acute findings. Proximal Outflow: Densely calcified. Irregular calcifications project into the lumen of the proximal right superficial femoral artery causing greater than 50% stenosis. Veins: Portal venous phase obtained, portal vein and mesenteric vessels are patent. No evidence of acute thrombus. Limited assessment of the IVC and iliac vasculature due to contrast bolus timing. Review of the MIP images confirms the above findings. NON-VASCULAR Lower chest: Multi chamber cardiomegaly.  Contrast refluxes into the hepatic veins and IVC consistent with elevated right heart pressures. Ground-glass densities in the lung bases may represent pulmonary edema. Trace left pleural effusion. Hepatobiliary: The liver is enlarged spanning 22 cm cranial caudal. There is subcapsular low-density involving the right lobe measuring approximately 3.5 cm best appreciated on portal venous phase imaging. Slight capsular retraction. This is not faintly visualized on prior exam. Decompressed gallbladder. No calcified gallstone. Pancreas: Parenchymal atrophy. No ductal dilatation or inflammation. Spleen: Normal in size without focal abnormality. Small splenule inferiorly. Adrenals/Urinary Tract: Left adrenal thickening without dominant nodule. Normal right adrenal gland. Bilateral renal parenchymal atrophy with lobulated contours. Probable small cyst in the mid left kidney. No hydronephrosis. Urinary bladder is near completely empty. Stomach/Bowel: Please note there is high-density material throughout the stomach, small and large bowel. This limits detection of acute contrast extravasation. There is no obvious localization of contrast in the GI tract to localize site of GI bleed, evaluations significantly limited. There is no bowel inflammation or wall thickening. Normal appendix. Lymphatic: No abdominopelvic adenopathy. Reproductive: Status post hysterectomy. No adnexal masses. Other: Small amount of free fluid in the dependent pelvis. No free air. Small fat containing umbilical hernia. Musculoskeletal: Bones are diffusely under mineralized. There are no acute or suspicious osseous abnormalities. IMPRESSION: 1. Please note assessment for contrast extravasation in the GI track is limited by high density material throughout the entire bowel. Allowing for this limitation, there is no obvious localization of GI bleed. No evidence of acute bowel inflammation.  2. Advanced aortic and branch atherosclerosis with predominantly  calcified plaque. No evidence of acute vascular finding. Areas of branch vessel stenosis as described above. 3. Cardiomegaly with contrast refluxing into the hepatic veins and IVC suggesting elevated right heart pressures. Probable pulmonary edema at the lung bases. Trace left pleural effusion. 4. Indeterminate lesion in the right lobe of the liver. This was faintly seen on prior noncontrast exam. Consider further evaluation with hepatic protocol MRI after resolution of acute event. Aortic Atherosclerosis (ICD10-I70.0). Electronically Signed   By: Keith Rake M.D.   On: 05/22/2020 18:21    Dialysis Orders:  Center: [facility]  on MWF. 180NRe, Time: 4 hours, BFR 400, DFR auto 1.5, EDW 70kg, 2K/2.25Ca, UF Profile 2, AVF 15g No heparin Mircera 200 mcg IVP q 2 weeks- last dose 172mcg on 10/25 Venofer 50mg  weekly  Assessment/Plan: 1.  Acute blood loss anemia: Hemoglobin 5.1 on arrival.  Received 2 units packed red blood cells.  She is on IV PPI and plan for endoscopy/colonoscopy.  She received Mircera on 05/14/2020. 2.  ESRD: Dialyzes on Monday Wednesday Friday schedule.  Potassium controlled.  She is not uremic or acutely volume overloaded.  We will plan for dialysis later today per her regular schedule. 3.  Hypertension/volume: BP soft, no evidence of volume overload on exam but did receive packed red blood cells today.  Antihypertensive medications on hold.  UF goal 1 to 1.5 L today.  4.  Metabolic bone disease: Calcium 9.3. Currently on clear liquids, resume phosphorus binder once eating.  5.  Nutrition:  Albumin is low, will need protein supplement once diet is advanced.   6. T2DM: Insulin per primary team  Anice Paganini, PA-C 05/23/2020, 8:38 AM  [facility] Pager: [phone]

## 2020-05-23 NOTE — Progress Notes (Cosign Needed)
Patient ID: [name], female   DOB: 06/24/1951, 69 y.o.   MRN: [id number] Request received from GI service for possible visceral arteriogram with embolization on patient.  Latest CT angiogram of abdomen pelvis was reviewed today by Dr. Laurence Ferrari.  Adequate evaluation of vascular structures for evidence of bleed is limited by high density contrast material throughout the entire bowel.  If patient continues to bleed recommend tagged red blood cell scan for further assessment.  Please contact Dr. Laurence Ferrari at [phone] or [phone] with any additional questions.

## 2020-05-23 NOTE — Progress Notes (Signed)
Report given to Judson Roch, RN. Patient is alert and oriented x4 resting in bed with call light in reach. VSS.  Nasal MRSA swab positive overnight. Plan is for hemodialysis today. CHG bath completed this AM. 3 units PRBCs given overnight. GI likely to do a colonoscopy 11/4. Hgb 8.5, Hct 26, platelets 226, K+ 4.8 and Creat 7.30 this AM.

## 2020-05-24 ENCOUNTER — Inpatient Hospital Stay (HOSPITAL_COMMUNITY): Payer: Medicare Other | Admitting: Anesthesiology

## 2020-05-24 ENCOUNTER — Encounter (HOSPITAL_COMMUNITY)
Admission: EM | Disposition: A | Discharge status: Home / Self Care | Payer: Self-pay | Source: Home / Self Care | Attending: Family Medicine

## 2020-05-24 ENCOUNTER — Encounter (HOSPITAL_COMMUNITY): Payer: Self-pay | Admitting: Family Medicine

## 2020-05-24 DIAGNOSIS — K31819 Angiodysplasia of stomach and duodenum without bleeding: Secondary | ICD-10-CM | POA: Diagnosis not present

## 2020-05-24 DIAGNOSIS — K2970 Gastritis, unspecified, without bleeding: Secondary | ICD-10-CM | POA: Diagnosis not present

## 2020-05-24 DIAGNOSIS — K922 Gastrointestinal hemorrhage, unspecified: Secondary | ICD-10-CM | POA: Diagnosis not present

## 2020-05-24 HISTORY — PX: ENTEROSCOPY: SHX5533

## 2020-05-24 HISTORY — PX: HOT HEMOSTASIS: SHX5433

## 2020-05-24 LAB — COMPREHENSIVE METABOLIC PANEL
ALT: 19 U/L (ref 0–44)
AST: 29 U/L (ref 15–41)
Albumin: 3 g/dL — ABNORMAL LOW (ref 3.5–5.0)
Alkaline Phosphatase: 72 U/L (ref 38–126)
Anion gap: 12 (ref 5–15)
BUN: 15 mg/dL (ref 8–23)
CO2: 29 mmol/L (ref 22–32)
Calcium: 8.3 mg/dL — ABNORMAL LOW (ref 8.9–10.3)
Chloride: 97 mmol/L — ABNORMAL LOW (ref 98–111)
Creatinine, Ser: 4.44 mg/dL — ABNORMAL HIGH (ref 0.44–1.00)
GFR, Estimated: 10 mL/min — ABNORMAL LOW (ref >60)
Glucose, Bld: 92 mg/dL (ref 70–99)
Potassium: 3.7 mmol/L (ref 3.5–5.1)
Sodium: 138 mmol/L (ref 135–145)
Total Bilirubin: 0.8 mg/dL (ref 0.3–1.2)
Total Protein: 6.5 g/dL (ref 6.5–8.1)

## 2020-05-24 LAB — CBC WITH DIFFERENTIAL/PLATELET
Abs Immature Granulocytes: 0.04 10*3/uL (ref 0.00–0.07)
Basophils Absolute: 0.1 10*3/uL (ref 0.0–0.1)
Basophils Relative: 1 %
Eosinophils Absolute: 0.3 10*3/uL (ref 0.0–0.5)
Eosinophils Relative: 3 %
HCT: 28.9 % — ABNORMAL LOW (ref 36.0–46.0)
Hemoglobin: 9.5 g/dL — ABNORMAL LOW (ref 12.0–15.0)
Immature Granulocytes: 1 %
Lymphocytes Relative: 16 %
Lymphs Abs: 1.3 10*3/uL (ref 0.7–4.0)
MCH: 32.3 pg (ref 26.0–34.0)
MCHC: 32.9 g/dL (ref 30.0–36.0)
MCV: 98.3 fL (ref 80.0–100.0)
Monocytes Absolute: 0.7 10*3/uL (ref 0.1–1.0)
Monocytes Relative: 9 %
Neutro Abs: 6.1 10*3/uL (ref 1.7–7.7)
Neutrophils Relative %: 70 %
Platelets: 250 10*3/uL (ref 150–400)
RBC: 2.94 MIL/uL — ABNORMAL LOW (ref 3.87–5.11)
RDW: 22.7 % — ABNORMAL HIGH (ref 11.5–15.5)
WBC: 8.5 10*3/uL (ref 4.0–10.5)
nRBC: 0 % (ref 0.0–0.2)

## 2020-05-24 LAB — GLUCOSE, CAPILLARY
Glucose-Capillary: 88 mg/dL (ref 70–99)
Glucose-Capillary: 176 mg/dL — ABNORMAL HIGH (ref 70–99)
Glucose-Capillary: 115 mg/dL — ABNORMAL HIGH (ref 70–99)
Glucose-Capillary: 98 mg/dL (ref 70–99)

## 2020-05-24 SURGERY — ENTEROSCOPY
Anesthesia: Monitor Anesthesia Care | Laterality: Right

## 2020-05-24 MED ORDER — LIDOCAINE 2% (20 MG/ML) 5 ML SYRINGE
INTRAMUSCULAR | Status: DC | PRN
  Administered 2020-05-24: 100 mg via INTRAVENOUS

## 2020-05-24 MED ORDER — PANTOPRAZOLE SODIUM 40 MG PO TBEC
40.0000 mg | DELAYED_RELEASE_TABLET | Freq: Every day | ORAL | Status: DC
  Administered 2020-05-25: 40 mg via ORAL
  Filled 2020-05-24: qty 1

## 2020-05-24 MED ORDER — METOCLOPRAMIDE HCL 5 MG/ML IJ SOLN
5.0000 mg | Freq: Once | INTRAMUSCULAR | Status: AC
  Administered 2020-05-24: 5 mg via INTRAVENOUS

## 2020-05-24 MED ORDER — PROPOFOL 500 MG/50ML IV EMUL
INTRAVENOUS | Status: DC | PRN
  Administered 2020-05-24: 150 ug/kg/min via INTRAVENOUS

## 2020-05-24 MED ORDER — GLUCAGON HCL RDNA (DIAGNOSTIC) 1 MG IJ SOLR
INTRAMUSCULAR | Status: AC
  Filled 2020-05-24: qty 1

## 2020-05-24 MED ORDER — PEG-KCL-NACL-NASULF-NA ASC-C 100 G PO SOLR: 1.0000 | Freq: Once | ORAL | Status: DC

## 2020-05-24 MED ORDER — PHENYLEPHRINE 40 MCG/ML (10ML) SYRINGE FOR IV PUSH (FOR BLOOD PRESSURE SUPPORT)
PREFILLED_SYRINGE | INTRAVENOUS | Status: DC | PRN
  Administered 2020-05-24: 160 ug via INTRAVENOUS
  Administered 2020-05-24 (×3): 120 ug via INTRAVENOUS

## 2020-05-24 MED ORDER — GLUCAGON HCL RDNA (DIAGNOSTIC) 1 MG IJ SOLR
INTRAMUSCULAR | Status: DC | PRN
  Administered 2020-05-24: .2 mg via INTRAVENOUS

## 2020-05-24 MED ORDER — METOCLOPRAMIDE HCL 5 MG/ML IJ SOLN
5.0000 mg | Freq: Once | INTRAMUSCULAR | Status: DC
  Filled 2020-05-24: qty 2

## 2020-05-24 MED ORDER — SODIUM CHLORIDE 0.9 % IV SOLN: INTRAVENOUS | Status: DC

## 2020-05-24 MED ORDER — ONDANSETRON HCL 4 MG/2ML IJ SOLN
INTRAMUSCULAR | Status: DC | PRN
  Administered 2020-05-24: 4 mg via INTRAVENOUS

## 2020-05-24 MED ORDER — PEG-KCL-NACL-NASULF-NA ASC-C 100 G PO SOLR
0.5000 | Freq: Once | ORAL | Status: AC
  Administered 2020-05-24: 100 g via ORAL
  Filled 2020-05-24: qty 1

## 2020-05-24 MED ORDER — PEG-KCL-NACL-NASULF-NA ASC-C 100 G PO SOLR
0.5000 | Freq: Once | ORAL | Status: DC
  Filled 2020-05-24: qty 1

## 2020-05-24 MED ORDER — BISACODYL 5 MG PO TBEC
20.0000 mg | DELAYED_RELEASE_TABLET | Freq: Once | ORAL | Status: AC
  Administered 2020-05-24: 20 mg via ORAL
  Filled 2020-05-24: qty 4

## 2020-05-24 MED ORDER — CHLORHEXIDINE GLUCONATE CLOTH 2 % EX PADS: 6.0000 | MEDICATED_PAD | Freq: Every day | CUTANEOUS | Status: DC

## 2020-05-24 SURGICAL SUPPLY — 22 items

## 2020-05-24 NOTE — Progress Notes (Addendum)
Dr Lyndel Safe to bedside, aware pt vomited, pt states she feels better, denies pain, CRNA gave iv zofran previously, safety maintained, linens changed, oral suctioning completed

## 2020-05-24 NOTE — Progress Notes (Signed)
Bowel prep was brought into room to patient. At this time, patient is declining the bowel prep, stating that she is tired of drinking the prep for them to decide at the last minute not to do it, and that it tastes nasty and she doesn't want to do it right now. Discussed with the patient the importance of completing the prep for her procedure tomorrow. She assures this RN that she will try to drink it later on tonight but that she may not drink all of it.

## 2020-05-24 NOTE — Progress Notes (Signed)
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 and session requiring APC at that admission transfused 2 units PRBC  admitted 05/22/2020 with syncope after using bathroom in a setting of nausea vomiting as well as large melanotic stool initial blood pressure 60 systolic hemoglobin found to be 5.1 given 3 units of blood CCM consulted but admitted by hospitalist   Assessment & Plan:   Principal Problem:   Upper GI bleed Active Problems:   GERD (gastroesophageal reflux disease)   ESRD on dialysis ([name])   Blindness of both eyes   Anemia in ESRD (end-stage renal disease) ([name])   Acquired absence of eye   Acute blood loss anemia   GI bleed   1. Probable upper GI bleed a. Bleeding seems to have stopped arguing against massive bleed b. Defer to GI further planning including scope and other interventions--continue only clear liquid or as per GI c. Continue Protonix gtt. in addition to prep for colonoscopy and defer rest to GI  d. Going for endoscopy and colonoscopy today 11/4 no recurrent large amount of bleeding therefore does not need IR intervention or tag scan at this time 2. hemorrhagic shock on admission a. Secondary to bleeding which seems to have slowly resolved b. Hold fluids for now given dialysis state and no hypotension 3. ESRD/HD a. Defer to nephrology further planning b. All meds on hold cautiously  resumed post procedure Hectorol, Phos renal iron aspirin etc. etc. 4. EF 45-50% a. Continue Coreg when more stable held from admission 5. DM TY 2 1/nephropathy neuropathy blindness a. CBGs 88-92 this morning b. Continue very sensitive sliding scale 6. prior bilateral AKA's with complications will need a. Further outpatient management 7. ?  Right liver lesion a. Needs dedicated imaging in the next several months once results  DVT prophylaxis: SCD if possible (patient has history AKA) Code Status: Full presumed Family Communication: None present currently Disposition:   Status is: Observation  The patient will require care spanning > 2 midnights and should be moved to inpatient because: Hemodynamically unstable, Persistent severe electrolyte disturbances and Ongoing active pain requiring inpatient pain management  Dispo: The patient is from: Home              Anticipated d/c is to: Home              Anticipated d/c date is: 3 days              Patient currently is not medically stable to d/c.       Consultants:   GI  Renal  Procedures: Multiple  Antimicrobials: None   Subjective: No new distress no reports of bleeding no chest pain no fever no chills ready for procedure   Objective: Vitals:   05/24/20 0230 05/24/20 0255 05/24/20 0307 05/24/20 0438  BP: (!) 135/50 (!) 132/35  (!) 119/48  Pulse: 71 72 73 78  Resp: 10 14 12 18   Temp:  97.7 F (36.5 C)  97.9 F (36.6 C)  TempSrc:  Oral  Oral  SpO2: 100% 99% 96% 100%  Weight:  73.3 kg    Height:        Intake/Output Summary (Last 24 hours) at 05/24/2020 0855 Last data filed at 05/24/2020 0255 Gross per 24 hour  Intake 129 ml  Output 1000 ml  Net -871 ml   Filed Weights   05/22/20 2248 05/23/20 2348 05/24/20 0255  Weight: 74 kg 74.7 kg 73.3 kg    Examination:  General exam: EOMI NCAT no icterus no pallor  Respiratory system: CTA B no added sound no rales no rhonchi Cardiovascular system: S1-S2 no  murmur rub gallop RRR Gastrointestinal system: Soft nontender no rebound no guarding. Central nervous system: Intact to gross motor in terms of power 5/5, sensory intact Extremities: Bilateral AKA noted-no changes Skin: None Psychiatry: Euthymic and coherent  Data Reviewed: I have personally reviewed following labs and imaging studies BUN/creatinine 36/7.3-->15/4.44 saturation ratio 66 ferritin 2222 hemoglobin 8.5-->9.5 INR 1.2  Radiology Studies: CT Angio Abd/Pel w/ and/or w/o  Result Date: 05/22/2020 CLINICAL DATA:  GI bleed with hemoglobin drop. EXAM: CTA ABDOMEN AND PELVIS WITHOUT AND WITH CONTRAST TECHNIQUE: Multidetector CT imaging of the abdomen and pelvis was performed using the standard protocol during bolus administration of intravenous contrast. Multiplanar reconstructed images and MIPs were obtained and reviewed to evaluate the vascular anatomy. CONTRAST:  121mL OMNIPAQUE IOHEXOL 350 MG/ML SOLN COMPARISON:  Most recent abdominal CT 08/28/2016 FINDINGS: VASCULAR Aorta: Moderately advanced atherosclerosis with primarily calcified plaque. No dissection or aneurysm. No severe stenosis. Celiac: Minimal stenosis at the origin due to calcified plaque. No poststenotic dilatation. Branch vessels are moderately calcified. No dissection or acute findings. SMA: Mixed calcified noncalcified atheromatous plaque with irregular plaque involving the proximal SMA. Distal branches are densely calcified. No evidence of severe stenosis or propped occlusion. Renals: Calcified plaque at the origin of both renal arteries with mild stenosis. Peripheral branches are patent but calcified. IMA: Stenotic at the origin with mild hypertrophy distally. Inflow: Densely calcified without severe stenosis or acute findings. Proximal Outflow: Densely calcified. Irregular calcifications project into the lumen of the proximal right superficial femoral artery causing greater than 50% stenosis. Veins: Portal venous phase  obtained, portal vein and mesenteric vessels are patent. No evidence of acute thrombus. Limited assessment of the IVC and iliac vasculature due to contrast bolus timing. Review of the MIP images confirms the above findings. NON-VASCULAR Lower chest: Multi chamber cardiomegaly. Contrast refluxes into the hepatic veins and IVC consistent with elevated right heart pressures. Ground-glass densities in the lung bases may represent pulmonary edema. Trace left pleural effusion. Hepatobiliary: The liver is enlarged spanning 22 cm cranial caudal. There is subcapsular low-density involving the right lobe measuring approximately 3.5 cm best appreciated on portal venous phase imaging. Slight capsular retraction. This is not faintly visualized on prior exam. Decompressed gallbladder. No calcified gallstone. Pancreas: Parenchymal atrophy. No ductal dilatation or inflammation. Spleen: Normal in size without focal abnormality. Small splenule inferiorly. Adrenals/Urinary Tract: Left adrenal thickening without dominant nodule. Normal right adrenal gland. Bilateral renal parenchymal atrophy with lobulated contours. Probable small cyst in the mid left kidney. No hydronephrosis. Urinary bladder is near completely empty. Stomach/Bowel: Please note there is high-density material throughout the stomach, small and large bowel. This limits detection of acute contrast extravasation. There is no obvious localization of contrast in the GI tract to localize site of GI bleed, evaluations significantly limited. There is no bowel inflammation or wall thickening. Normal appendix. Lymphatic: No abdominopelvic adenopathy. Reproductive:  Status post hysterectomy. No adnexal masses. Other: Small amount of free fluid in the dependent pelvis. No free air. Small fat containing umbilical hernia. Musculoskeletal: Bones are diffusely under mineralized. There are no acute or suspicious osseous abnormalities. IMPRESSION: 1. Please note assessment for contrast  extravasation in the GI track is limited by high density material throughout the entire bowel. Allowing for this limitation, there is no obvious localization of GI bleed. No evidence of acute bowel inflammation. 2. Advanced aortic and branch atherosclerosis with predominantly calcified plaque. No evidence of acute vascular finding. Areas of branch vessel stenosis as described above. 3. Cardiomegaly with contrast refluxing into the hepatic veins and IVC suggesting elevated right heart pressures. Probable pulmonary edema at the lung bases. Trace left pleural effusion. 4. Indeterminate lesion in the right lobe of the liver. This was faintly seen on prior noncontrast exam. Consider further evaluation with hepatic protocol MRI after resolution of acute event. Aortic Atherosclerosis (ICD10-I70.0). Electronically Signed   By: Keith Rake M.D.   On: 05/22/2020 18:21     Scheduled Meds: . [MAR Hold] sodium chloride   Intravenous Once  . [MAR Hold] Chlorhexidine Gluconate Cloth  6 each Topical Q0600  . [MAR Hold] Chlorhexidine Gluconate Cloth  6 each Topical Q0600  . [MAR Hold] insulin aspart  0-6 Units Subcutaneous TID WC  . [MAR Hold] mupirocin ointment  1 application Nasal BID  . [MAR Hold] pantoprazole (PROTONIX) IV  40 mg Intravenous Q12H   Continuous Infusions:    LOS: 1 day    Time spent: [place], MD [facility] To contact the attending provider between 7A-7P or the covering provider during after hours 7P-7A, please log into the web site [email or web address] and access using universal [place] password for that web site. If you do not have the password, please call the hospital operator.  05/24/2020, 8:55 AM

## 2020-05-24 NOTE — Anesthesia Postprocedure Evaluation (Signed)
Anesthesia Post Note  Patient: [name]  Procedure(s) Performed: ENTEROSCOPY (N/A ) HOT HEMOSTASIS (ARGON PLASMA COAGULATION/BICAP) (Right )     Patient location during evaluation: Endoscopy Anesthesia Type: MAC Level of consciousness: awake and alert Pain management: pain level controlled Vital Signs Assessment: post-procedure vital signs reviewed and stable Respiratory status: spontaneous breathing, nonlabored ventilation, respiratory function stable and patient connected to nasal cannula oxygen Cardiovascular status: stable and blood pressure returned to baseline Postop Assessment: no apparent nausea or vomiting Anesthetic complications: no   No complications documented.  Last Vitals:  Vitals:   05/24/20 1015 05/24/20 1036  BP:  (!) 109/46  Pulse: 94 78  Resp: (!) 21 17  Temp:  37 C  SpO2: 100% 99%    Last Pain:  Vitals:   05/24/20 1036  TempSrc: Oral  PainSc:                  Keilynn Marano,W. EDMOND

## 2020-05-24 NOTE — Progress Notes (Signed)
Spoke with Esther Hardy at [facility]. Patient is active for palliative services at this time.   Manya Silvas, RN CM  Transitions of Care [phone]

## 2020-05-24 NOTE — Op Note (Signed)
[facility] Patient Name: [name] Procedure Date : 05/24/2020 MRN: [id number] Attending MD: Jackquline Denmark , MD Date of Birth: Jan 10, 1951 CSN: [id number] Age: 69 Admit Type: Inpatient Procedure:                Small bowel enteroscopy Indications:              Melena. 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 CE 11/2019: Tiny nonbleeding AVM in mid SB.                            Off Plavix previously d/t recurrent GI bleed. Providers:                Jackquline Denmark, MD, Erenest Rasher, RN, Cletis Athens,                            Technician, Lerry Paterson, CRNA Referring MD:              Medicines:                Monitored Anesthesia Care Complications:            No immediate complications. I was notified by                            anesthesia that she did have one episode of                            vomiting in the post op recovery area. Estimated Blood Loss:     Estimated blood loss: none. Procedure:                Pre-Anesthesia Assessment:                           - Prior to the procedure, a History and Physical                            was performed, and patient medications and                            allergies were reviewed. The patient's tolerance of                            previous anesthesia was also reviewed. The risks                            and benefits of the procedure and the sedation  options and risks were discussed with the patient.                            All questions were answered, and informed consent                            was obtained. Prior Anticoagulants: The patient has                            taken no previous anticoagulant or antiplatelet                            agents. ASA Grade Assessment: IV - A patient with                             severe systemic disease that is a constant threat                            to life. After reviewing the risks and benefits,                            the patient was deemed in satisfactory condition to                            undergo the procedure.                           After obtaining informed consent, the endoscope was                            passed under direct vision. Throughout the                            procedure, the patient's blood pressure, pulse, and                            oxygen saturations were monitored continuously. The                            [id number]-[id number] ([id number] Olympus pediatric colonoscope                            was introduced through the mouth and advanced to                            the mid-jejunum. The small bowel enteroscopy was                            accomplished without difficulty. The patient                            tolerated the procedure well. Scope In: Scope Out: Findings:      A single 6 mm angiodysplastic lesion with no bleeding was found in  the       stomach. Coagulation for hemostasis using argon plasma at 2       liters/minute and 20 watts was successful. Estimated blood loss: none.      Localized mild inflammation characterized by erythema was found in the       gastric antrum.      There was no evidence of significant pathology in the entire examined       duodenum.      Two small nonbleeding AVMs were found in the proximal jejunum.       Coagulation for hemostasis using argon plasma at 2 liters/minute and 20       watts was successful.      Tattoo was not visualized.      Glucagon IV was given on withdrawal of the scope after reaching the most       distal end. Impression:               - A single non-bleeding angiodysplastic lesion in                            the stomach. Treated with argon plasma coagulation                            (APC).                           - Mild  gastritis.                           - Normal examined duodenum.                           - Two non-bleeding angiodysplastic lesions in the                            duodenum. Treated with argon plasma coagulation                            (APC).                           - No specimens collected. Recommendation:           - Return patient to hospital ward for ongoing care.                           - Clear liquid diet.                           - Would continue Protonix 40 mg p.o. once a day.                           - Avoid nonsteroidals.                           - Trend CBC.                           - Patient did have 1 episode of vomiting  in the                            postop recovery area. Watch for any aspiration.                           - If okay, would proceed with preparation for                            colonoscopy in a.m.                           - The findings and recommendations were discussed                            with the patient's family. 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 I do believe she has become                            transfusion dependent. Procedure Code(s):        --- Professional ---                           [phone], Small intestinal endoscopy, enteroscopy                            beyond second portion of duodenum, not including                            ileum; with control of bleeding (eg, injection,                            bipolar cautery, unipolar cautery, laser, heater                            probe, stapler, plasma coagulator) Diagnosis Code(s):        --- Professional ---                           K31.819, Angiodysplasia of stomach and duodenum                            without bleeding                           K29.70, Gastritis, unspecified, without bleeding                           K92.2, Gastrointestinal hemorrhage, unspecified CPT copyright 2019 [employer]. All rights  reserved. The codes documented in this report are preliminary and upon coder review may  be revised to meet current compliance requirements. Jackquline Denmark, MD 05/24/2020 10:02:25 AM This report has been signed electronically. Number of Addenda: 0

## 2020-05-24 NOTE — Interval H&P Note (Signed)
History and Physical Interval Note:  05/24/2020 9:09 AM  [name]  has presented today for surgery, with the diagnosis of Acute blood loss anemia on chronic anemia..  The various methods of treatment have been discussed with the patient and family. After consideration of risks, benefits and other options for treatment, the patient has consented to  Procedure(s): ENTEROSCOPY (N/A) as a surgical intervention.  The patient's history has been reviewed, patient examined, no change in status, stable for surgery.  I have reviewed the patient's chart and labs.  Questions were answered to the patient's satisfaction.    Patient was originally scheduled for enteroscopy and colonoscopy.  However, despite preparation she is not clear and is having opaque dark stool.  Hence, colonoscopy to be rescheduled for a.m.   [name]

## 2020-05-24 NOTE — Progress Notes (Signed)
[facility] Progress Note   Subjective:   Pt seen and examined at bedside. Had HD yesterday with net UF 1L. S/p EGD this morning, reports colonoscopy planned for tomorrow. Was nauseated but feeling better. No SOB, CP, palpitations or dizziness.   Objective Vitals:   05/24/20 0958 05/24/20 1010 05/24/20 1015 05/24/20 1036  BP: (!) 110/23 (!) 98/30  (!) 109/46  Pulse: 84 60 94 78  Resp: (!) 27 (!) 21 (!) 21 17  Temp:    98.6 F (37 C)  TempSrc:    Oral  SpO2: 94% 94% 100% 99%  Weight:      Height:       Physical Exam General: Well developed, alert female in NAD Heart: RRR, + systolic murmur Lungs: CTA bilaterally without wheezing, rhonchi or rales Abdomen: Soft, non-distended, +BS Extremities: No edema b/l lower extremities Dialysis Access: RUE AVF +t/b  Additional Objective Labs: Basic Metabolic Panel: Recent Labs  Lab 05/22/20 1323 05/22/20 1323 05/22/20 1414 05/23/20 0050 05/24/20 0400  NA 139   < > 139 136 138  K 5.1   < > 4.5 4.8 3.7  CL 100   < > 100 96* 97*  CO2 25  --   --  25 29  GLUCOSE 113*   < > 103* 137* 92  BUN 30*   < > 37* 36* 15  CREATININE 6.40*   < > 6.70* 7.30* 4.44*  CALCIUM 8.0*  --   --  8.4* 8.3*   < > = values in this interval not displayed.   Liver Function Tests: Recent Labs  Lab 05/22/20 1323 05/23/20 0050 05/24/20 0400  AST 53* 18 29  ALT _0 ALKPHOS 54 59 72  BILITOT 1.0 0.9 0.8  PROT 5.5* 5.8* 6.5  ALBUMIN 2.7* 2.9* 3.0*   CBC: Recent Labs  Lab 05/22/20 1323 05/22/20 1414 05/22/20 2200 05/23/20 0050 05/24/20 0400  WBC 8.9   < > 7.1 9.0 8.5  NEUTROABS 6.5  --   --   --  6.1  HGB 5.1*   < > 7.6* 8.5* 9.5*  HCT 17.7*   < > 24.1* 26.0* 28.9*  MCV 111.3*  --  98.4 96.3 98.3  PLT 190   < > 160 228 250   < > = values in this interval not displayed.   Blood Culture    Component Value Date/Time   SDES BLOOD LEFT HAND 10/19/2019 1959   SPECREQUEST  10/19/2019 1959    BOTTLES DRAWN AEROBIC ONLY  Blood Culture adequate volume   CULT  10/19/2019 1959    NO GROWTH 5 DAYS Performed at [facility], [place] [address] [place], [place] [address]    REPTSTATUS 10/24/2019 FINAL 10/19/2019 1959   CBG: Recent Labs  Lab 05/23/20 0822 05/23/20 1221 05/23/20 1636 05/23/20 2103 05/24/20 0752  GLUCAP 137* 125* 106* 134* 88   Iron Studies:  Recent Labs    05/23/20 0050  IRON 162  TIBC 245*  FERRITIN 2,222*   _1 @ Studies/Results: CT Angio Abd/Pel w/ and/or w/o  Result Date: 05/22/2020 CLINICAL DATA:  GI bleed with hemoglobin drop. EXAM: CTA ABDOMEN AND PELVIS WITHOUT AND WITH CONTRAST TECHNIQUE: Multidetector CT imaging of the abdomen and pelvis was performed using the standard protocol during bolus administration of intravenous contrast. Multiplanar reconstructed images and MIPs were obtained and reviewed to evaluate the vascular anatomy. CONTRAST:  116m OMNIPAQUE IOHEXOL 350 MG/ML SOLN COMPARISON:  Most recent abdominal CT 08/28/2016 FINDINGS: VASCULAR  Aorta: Moderately advanced atherosclerosis with primarily calcified plaque. No dissection or aneurysm. No severe stenosis. Celiac: Minimal stenosis at the origin due to calcified plaque. No poststenotic dilatation. Branch vessels are moderately calcified. No dissection or acute findings. SMA: Mixed calcified noncalcified atheromatous plaque with irregular plaque involving the proximal SMA. Distal branches are densely calcified. No evidence of severe stenosis or propped occlusion. Renals: Calcified plaque at the origin of both renal arteries with mild stenosis. Peripheral branches are patent but calcified. IMA: Stenotic at the origin with mild hypertrophy distally. Inflow: Densely calcified without severe stenosis or acute findings. Proximal Outflow: Densely calcified. Irregular calcifications project into the lumen of the proximal right superficial femoral artery causing greater than 50% stenosis. Veins: Portal venous phase  obtained, portal vein and mesenteric vessels are patent. No evidence of acute thrombus. Limited assessment of the IVC and iliac vasculature due to contrast bolus timing. Review of the MIP images confirms the above findings. NON-VASCULAR Lower chest: Multi chamber cardiomegaly. Contrast refluxes into the hepatic veins and IVC consistent with elevated right heart pressures. Ground-glass densities in the lung bases may represent pulmonary edema. Trace left pleural effusion. Hepatobiliary: The liver is enlarged spanning 22 cm cranial caudal. There is subcapsular low-density involving the right lobe measuring approximately 3.5 cm best appreciated on portal venous phase imaging. Slight capsular retraction. This is not faintly visualized on prior exam. Decompressed gallbladder. No calcified gallstone. Pancreas: Parenchymal atrophy. No ductal dilatation or inflammation. Spleen: Normal in size without focal abnormality. Small splenule inferiorly. Adrenals/Urinary Tract: Left adrenal thickening without dominant nodule. Normal right adrenal gland. Bilateral renal parenchymal atrophy with lobulated contours. Probable small cyst in the mid left kidney. No hydronephrosis. Urinary bladder is near completely empty. Stomach/Bowel: Please note there is high-density material throughout the stomach, small and large bowel. This limits detection of acute contrast extravasation. There is no obvious localization of contrast in the GI tract to localize site of GI bleed, evaluations significantly limited. There is no bowel inflammation or wall thickening. Normal appendix. Lymphatic: No abdominopelvic adenopathy. Reproductive: Status post hysterectomy. No adnexal masses. Other: Small amount of free fluid in the dependent pelvis. No free air. Small fat containing umbilical hernia. Musculoskeletal: Bones are diffusely under mineralized. There are no acute or suspicious osseous abnormalities. IMPRESSION: 1. Please note assessment for contrast  extravasation in the GI track is limited by high density material throughout the entire bowel. Allowing for this limitation, there is no obvious localization of GI bleed. No evidence of acute bowel inflammation. 2. Advanced aortic and branch atherosclerosis with predominantly calcified plaque. No evidence of acute vascular finding. Areas of branch vessel stenosis as described above. 3. Cardiomegaly with contrast refluxing into the hepatic veins and IVC suggesting elevated right heart pressures. Probable pulmonary edema at the lung bases. Trace left pleural effusion. 4. Indeterminate lesion in the right lobe of the liver. This was faintly seen on prior noncontrast exam. Consider further evaluation with hepatic protocol MRI after resolution of acute event. Aortic Atherosclerosis (ICD10-I70.0). Electronically Signed   By: Keith Rake M.D.   On: 05/22/2020 18:21   Medications: . sodium chloride Stopped (05/24/20 0955)   . sodium chloride   Intravenous Once  . bisacodyl  20 mg Oral Once  . Chlorhexidine Gluconate Cloth  6 each Topical Q0600  . Chlorhexidine Gluconate Cloth  6 each Topical Q0600  . insulin aspart  0-6 Units Subcutaneous TID WC  . metoCLOPramide (REGLAN) injection  5 mg Intravenous Once   Followed by  .  metoCLOPramide (REGLAN) injection  5 mg Intravenous Once  . mupirocin ointment  1 application Nasal BID  . pantoprazole (PROTONIX) IV  40 mg Intravenous Q12H  . peg 3350 powder  1 kit Oral Once    Dialysis Orders: Center: [facility]  on MWF. 180NRe, Time: 4 hours, BFR 400, DFR auto 1.5, EDW 70kg, 2K/2.25Ca, UF Profile 2, AVF 15g No heparin Mircera 200 mcg IVP q 2 weeks- last dose 152mg on 10/25 Venofer 562mweekly   Assessment/Plan: 1.  Acute blood loss anemia: Hemoglobin 5.1 on arrival.  Received 2 units packed red blood cells.  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  Planned for colonoscopy tomorrow. She received  Mircera on 05/14/2020.  2.  ESRD: Dialyzes on Monday Wednesday Friday schedule.  Potassium controlled.  She is not uremic or acutely volume overloaded.  Continue HD on MWF schedule.  3.  Hypertension/volume: BP soft, no evidence of volume overload on exam.  Antihypertensive medications on hold.  UF goal 1-1.5L tomorrow.  4.  Metabolic bone disease: Calcium at goal. Currently on clear liquids, resume phosphorus binder once eating.  5.  Nutrition:  Albumin is low, will need protein supplement once diet is advanced. 6. T2DM: Insulin per primary team  SaAnice PaganiniPA-C 05/24/2020, 10:48 AM  [facility] Pager: [phone]

## 2020-05-24 NOTE — Transfer of Care (Signed)
Immediate Anesthesia Transfer of Care Note  Patient: [name]  Procedure(s) Performed: ENTEROSCOPY (N/A ) HOT HEMOSTASIS (ARGON PLASMA COAGULATION/BICAP) (Right )  Patient Location: Endoscopy Unit  Anesthesia Type:MAC  Level of Consciousness: drowsy  Airway & Oxygen Therapy: [name]  Post-op Assessment: Report given to RN and Post -op Vital signs reviewed and stable. Patient vomited bilious fluid in recovery. MD aware  Post vital signs: Reviewed and stable  Last Vitals:  Vitals Value Taken Time  BP 113/55 05/24/20 0951  Temp    Pulse 82 05/24/20 0956  Resp 23 05/24/20 0956  SpO2 95 % 05/24/20 0956  Vitals shown include unvalidated device data.  Last Pain:  Vitals:   05/24/20 0901  TempSrc: Axillary  PainSc: 0-No pain         Complications: No complications documented.

## 2020-05-25 ENCOUNTER — Inpatient Hospital Stay (HOSPITAL_COMMUNITY): Payer: Medicare Other | Admitting: Certified Registered Nurse Anesthetist

## 2020-05-25 ENCOUNTER — Encounter (HOSPITAL_COMMUNITY)
Admission: EM | Disposition: A | Discharge status: Home / Self Care | Payer: Self-pay | Source: Home / Self Care | Attending: Family Medicine

## 2020-05-25 DIAGNOSIS — D126 Benign neoplasm of colon, unspecified: Secondary | ICD-10-CM

## 2020-05-25 DIAGNOSIS — K635 Polyp of colon: Secondary | ICD-10-CM

## 2020-05-25 HISTORY — PX: COLONOSCOPY WITH PROPOFOL: SHX5780

## 2020-05-25 HISTORY — PX: POLYPECTOMY: SHX5525

## 2020-05-25 LAB — CBC WITH DIFFERENTIAL/PLATELET
Abs Immature Granulocytes: 0.04 10*3/uL (ref 0.00–0.07)
Basophils Absolute: 0.1 10*3/uL (ref 0.0–0.1)
Basophils Relative: 1 %
Eosinophils Absolute: 0.1 10*3/uL (ref 0.0–0.5)
Eosinophils Relative: 1 %
HCT: 31.2 % — ABNORMAL LOW (ref 36.0–46.0)
Hemoglobin: 9.8 g/dL — ABNORMAL LOW (ref 12.0–15.0)
Immature Granulocytes: 0 %
Lymphocytes Relative: 13 %
Lymphs Abs: 1.3 10*3/uL (ref 0.7–4.0)
MCH: 31.8 pg (ref 26.0–34.0)
MCHC: 31.4 g/dL (ref 30.0–36.0)
MCV: 101.3 fL — ABNORMAL HIGH (ref 80.0–100.0)
Monocytes Absolute: 1 10*3/uL (ref 0.1–1.0)
Monocytes Relative: 11 %
Neutro Abs: 7 10*3/uL (ref 1.7–7.7)
Neutrophils Relative %: 74 %
Platelets: 252 10*3/uL (ref 150–400)
RBC: 3.08 MIL/uL — ABNORMAL LOW (ref 3.87–5.11)
RDW: 22.3 % — ABNORMAL HIGH (ref 11.5–15.5)
WBC: 9.5 10*3/uL (ref 4.0–10.5)
nRBC: 0 % (ref 0.0–0.2)

## 2020-05-25 LAB — COMPREHENSIVE METABOLIC PANEL
ALT: 20 U/L (ref 0–44)
AST: 27 U/L (ref 15–41)
Albumin: 3 g/dL — ABNORMAL LOW (ref 3.5–5.0)
Alkaline Phosphatase: 76 U/L (ref 38–126)
Anion gap: 19 — ABNORMAL HIGH (ref 5–15)
BUN: 21 mg/dL (ref 8–23)
CO2: 20 mmol/L — ABNORMAL LOW (ref 22–32)
Calcium: 8.6 mg/dL — ABNORMAL LOW (ref 8.9–10.3)
Chloride: 101 mmol/L (ref 98–111)
Creatinine, Ser: 6.64 mg/dL — ABNORMAL HIGH (ref 0.44–1.00)
GFR, Estimated: 6 mL/min — ABNORMAL LOW (ref >60)
Glucose, Bld: 113 mg/dL — ABNORMAL HIGH (ref 70–99)
Potassium: 3.9 mmol/L (ref 3.5–5.1)
Sodium: 140 mmol/L (ref 135–145)
Total Bilirubin: 1.1 mg/dL (ref 0.3–1.2)
Total Protein: 6.1 g/dL — ABNORMAL LOW (ref 6.5–8.1)

## 2020-05-25 LAB — GLUCOSE, CAPILLARY
Glucose-Capillary: 94 mg/dL (ref 70–99)
Glucose-Capillary: 88 mg/dL (ref 70–99)
Glucose-Capillary: 77 mg/dL (ref 70–99)
Glucose-Capillary: 121 mg/dL — ABNORMAL HIGH (ref 70–99)

## 2020-05-25 SURGERY — COLONOSCOPY WITH PROPOFOL
Anesthesia: Monitor Anesthesia Care

## 2020-05-25 MED ORDER — PHENYLEPHRINE 40 MCG/ML (10ML) SYRINGE FOR IV PUSH (FOR BLOOD PRESSURE SUPPORT)
PREFILLED_SYRINGE | INTRAVENOUS | Status: DC | PRN
  Administered 2020-05-25: 80 ug via INTRAVENOUS
  Administered 2020-05-25: 40 ug via INTRAVENOUS

## 2020-05-25 MED ORDER — LIDOCAINE 2% (20 MG/ML) 5 ML SYRINGE
INTRAMUSCULAR | Status: DC | PRN
  Administered 2020-05-25: 40 mg via INTRAVENOUS

## 2020-05-25 MED ORDER — DOXERCALCIFEROL 4 MCG/2ML IV SOLN
2.0000 ug | INTRAVENOUS | Status: DC
  Administered 2020-05-25: 2 ug via INTRAVENOUS

## 2020-05-25 MED ORDER — PROPOFOL 500 MG/50ML IV EMUL
INTRAVENOUS | Status: DC | PRN
  Administered 2020-05-25: 125 ug/kg/min via INTRAVENOUS

## 2020-05-25 MED ORDER — DOXERCALCIFEROL 4 MCG/2ML IV SOLN
INTRAVENOUS | Status: AC
  Filled 2020-05-25: qty 2

## 2020-05-25 MED ORDER — PROPOFOL 10 MG/ML IV BOLUS
INTRAVENOUS | Status: DC | PRN
  Administered 2020-05-25: 10 mg via INTRAVENOUS

## 2020-05-25 MED ORDER — SODIUM CHLORIDE 0.9 % IV SOLN: INTRAVENOUS | Status: DC | PRN

## 2020-05-25 MED ORDER — LANTHANUM CARBONATE 500 MG PO CHEW: 750.0000 mg | CHEWABLE_TABLET | Freq: Three times a day (TID) | ORAL | Status: DC

## 2020-05-25 MED ORDER — DIPHENHYDRAMINE HCL 25 MG PO CAPS
25.0000 mg | ORAL_CAPSULE | Freq: Four times a day (QID) | ORAL | Status: DC | PRN
  Administered 2020-05-25: 25 mg via ORAL
  Filled 2020-05-25: qty 1

## 2020-05-25 SURGICAL SUPPLY — 22 items

## 2020-05-25 NOTE — Progress Notes (Signed)
[facility] Progress Note   Subjective:   Seen post colonoscopy. Tired but reports it went well. No SOB, CP, palpitations, dizziness or nausea.  Objective Vitals:   05/25/20 0728 05/25/20 0849 05/25/20 0900 05/25/20 0920  BP: (!) 150/39 (!) 108/42 113/66 (!) 120/49  Pulse:  75 77 80  Resp: 19 13 (!) 23 17  Temp: 99.5 F (37.5 C) 97.8 F (36.6 C)  98 F (36.7 C)  TempSrc: Oral Temporal  Oral  SpO2: 100% 97% 95% 96%  Weight:      Height:       Physical Exam General: Well developed female in NAD Heart: RRR, 2/6 systolic murmur Lungs: CTA bilaterally without wheezing, rhonchi or rales Abdomen: Soft, non-distended, +BS Extremities: No edema b/l lower extremities Dialysis Access:  RUE AVF + bruit  Additional Objective Labs: Basic Metabolic Panel: Recent Labs  Lab 05/23/20 0050 05/24/20 0400 05/25/20 0125  NA 136 138 140  K 4.8 3.7 3.9  CL 96* 97* 101  CO2 25 29 20*  GLUCOSE 137* 92 113*  BUN 36* 15 21  CREATININE 7.30* 4.44* 6.64*  CALCIUM 8.4* 8.3* 8.6*   Liver Function Tests: Recent Labs  Lab 05/23/20 0050 05/24/20 0400 05/25/20 0125  AST _0 ALT _1 ALKPHOS 59 72 76  BILITOT 0.9 0.8 1.1  PROT 5.8* 6.5 6.1*  ALBUMIN 2.9* 3.0* 3.0*   CBC: Recent Labs  Lab 05/22/20 1323 05/22/20 1414 05/22/20 2200 05/22/20 2200 05/23/20 0050 05/24/20 0400 05/25/20 0125  WBC 8.9   < > 7.1   < > 9.0 8.5 9.5  NEUTROABS 6.5  --   --   --   --  6.1 7.0  HGB 5.1*   < > 7.6*   < > 8.5* 9.5* 9.8*  HCT 17.7*   < > 24.1*   < > 26.0* 28.9* 31.2*  MCV 111.3*  --  98.4  --  96.3 98.3 101.3*  PLT 190   < > 160   < > 228 250 252   < > = values in this interval not displayed.   Blood Culture    Component Value Date/Time   SDES BLOOD LEFT HAND 10/19/2019 1959   SPECREQUEST  10/19/2019 1959    BOTTLES DRAWN AEROBIC ONLY Blood Culture adequate volume   CULT  10/19/2019 1959    NO GROWTH 5 DAYS Performed at [facility], [place] [address] [place], [place] [address]    REPTSTATUS 10/24/2019 FINAL 10/19/2019 1959   CBG: Recent Labs  Lab 05/24/20 1203 05/24/20 1649 05/24/20 2046 05/25/20 0735 05/25/20 0848  GLUCAP 176* 115* 98 94 88   Iron Studies:  Recent Labs    05/23/20 0050  IRON 162  TIBC 245*  FERRITIN 2,222*   Medications: . sodium chloride Stopped (05/24/20 0955)   . sodium chloride   Intravenous Once  . Chlorhexidine Gluconate Cloth  6 each Topical Q0600  . Chlorhexidine Gluconate Cloth  6 each Topical Q0600  . Chlorhexidine Gluconate Cloth  6 each Topical Q0600  . insulin aspart  0-6 Units Subcutaneous TID WC  . metoCLOPramide (REGLAN) injection  5 mg Intravenous Once  . mupirocin ointment  1 application Nasal BID  . pantoprazole  40 mg Oral Q0600  . peg 3350 powder  0.5 kit Oral Once    [facility] Time: 4 hours, BFR 400, DFR auto 1.5, EDW 70kg, 2K/2.25Ca, UF Profile 2, AVF  15g No heparin Mircera 200 mcg IVP q 2 weeks- last dose 146mg on 10/25 Venofer 512mweekly Hectorol 2 mcg IVP q HD  Assessment/Plan: 1. Acute blood loss anemia:Hemoglobin 5.1 on arrival. Received 2 units packed red blood cells. She is on IV PPI and underwent endoscopy 11/4 which showed AVMs, s/p colonoscopy today. She received Mircera on 05/14/2020, resume with HD on Monday if still admitted.  2. ESRD:Dialyzes on Monday Wednesday Friday schedule. Potassium controlled. She is not uremic or acutely volume overloaded. Continue HD on MWF schedule.  3. Hypertension/volume:BP soft, no evidence of volume overload on exam. Antihypertensive medications on hold. UF goal 1-1.5L today.  4. Metabolic bone disease:Calcium at goal. Resumed phosphorus binder. Continue hectorol.  5. T2DM: Insulin per primary team  SaAnice PaganiniPA-C 05/25/2020, 11:06 AM  [facility] Pager: [phone]

## 2020-05-25 NOTE — TOC Progression Note (Addendum)
Transition of Care [facility]) - Progression Note    Patient Details  Name: [name] MRN: [id number] Date of Birth: [birth year]  Transition of Care [facility] CM/SW Contact  Zenon [place], RN Phone Number: 05/25/2020, 12:02 PM  Clinical Narrative:    NCM spoke with patient , she states to cal her sister [name] states she just would like to keep the palliative services with [facility]. NCM notified Freddie Breech with [facility] , that patient is for dc today.       Expected Discharge Plan and Services           Expected Discharge Date: 05/25/20                                     Social Determinants of Health (SDOH) Interventions    Readmission Risk Interventions Readmission Risk Prevention Plan 11/18/2019 10/24/2019 10/21/2019  Transportation Screening Complete - Complete  PCP or Specialist Appt within 3-5 Days - - Complete  HRI or Pump [place] - - Complete  Social Work Consult for [place] Planning/Counseling - - Complete  Palliative Care Screening - - Complete  Medication Review [employer] photographer) Complete - Complete  PCP or Specialist appointment within 3-5 days of discharge Complete Complete -  [place] or Home Care Consult Complete Complete -  SW Recovery Care/Counseling Consult Complete Complete -  Palliative Care Screening Complete Not Applicable -  [place] Complete Patient Refused -  Some recent data might be hidden

## 2020-05-25 NOTE — Progress Notes (Signed)
Patient to discharge to private vehicle with sister [name] and her son. Sister to provide patient's home portable oxygen machine for transport home.

## 2020-05-25 NOTE — Anesthesia Postprocedure Evaluation (Signed)
Anesthesia Post Note  Patient: [name]  Procedure(s) Performed: COLONOSCOPY WITH PROPOFOL (N/A ) POLYPECTOMY     Patient location during evaluation: Endoscopy Anesthesia Type: MAC Level of consciousness: awake and sedated Pain management: pain level controlled Vital Signs Assessment: post-procedure vital signs reviewed and stable Respiratory status: spontaneous breathing Cardiovascular status: stable Postop Assessment: no apparent nausea or vomiting Anesthetic complications: no   No complications documented.  Last Vitals:  Vitals:   05/25/20 0900 05/25/20 0920  BP: 113/66 (!) 120/49  Pulse: 77 80  Resp: (!) 23 17  Temp:  36.7 C  SpO2: 95% 96%    Last Pain:  Vitals:   05/25/20 0920  TempSrc: Oral  PainSc: [name]

## 2020-05-25 NOTE — Op Note (Signed)
[facility] Patient Name: [name] Procedure Date : 05/25/2020 MRN: [id number] Attending MD: Carlota Raspberry. Havery Moros , MD Date of Birth: 1951-01-16 CSN: [id number] Age: 69 Admit Type: Inpatient Procedure:                Colonoscopy Indications:              Gastrointestinal bleeding, history of gastric and                            small bowel AVMs s/p APC yesterday, colonoscopy to                            exclude lower tract etiology Providers:                Remo Lipps P. Havery Moros, MD, Josie Dixon, RN,                            Particia Nearing, RN, Cherylynn Ridges, Technician,                            Reather Laurence, CRNA Referring MD:              Medicines:                Monitored Anesthesia Care Complications:            No immediate complications. Estimated blood loss:                            Minimal. Estimated Blood Loss:     Estimated blood loss was minimal. Procedure:                Pre-Anesthesia Assessment:                           - Prior to the procedure, a History and Physical                            was performed, and patient medications and                            allergies were reviewed. The patient's tolerance of                            previous anesthesia was also reviewed. The risks                            and benefits of the procedure and the sedation                            options and risks were discussed with the patient.                            All questions were answered, and informed consent                            was obtained.  Prior Anticoagulants: The patient has                            taken no previous anticoagulant or antiplatelet                            agents. ASA Grade Assessment: III - A patient with                            severe systemic disease. After reviewing the risks                            and benefits, the patient was deemed in                            satisfactory condition  to undergo the procedure.                           After obtaining informed consent, the colonoscope                            was passed under direct vision. Throughout the                            procedure, the patient's blood pressure, pulse, and                            oxygen saturations were monitored continuously. The                            PCF-H190DL ([id number] Olympus pediatric colonoscope                            was introduced through the anus and advanced to the                            the terminal ileum, with identification of the                            appendiceal orifice and IC valve. The colonoscopy                            was performed without difficulty. The patient                            tolerated the procedure well. The quality of the                            bowel preparation was adequate. The terminal ileum,                            ileocecal valve, appendiceal orifice, and rectum  were photographed. Scope In: 8:13:17 AM Scope Out: 8:39:06 AM Scope Withdrawal Time: 0 hours 21 minutes 58 seconds  Total Procedure Duration: 0 hours 25 minutes 49 seconds  Findings:      The perianal and digital rectal examinations were normal.      The terminal ileum appeared normal.      A 5 mm polyp was found in the cecum. The polyp was sessile. The polyp       was removed with a cold snare. Resection and retrieval were complete.      A few small-mouthed diverticula were found in the sigmoid colon.      Internal hemorrhoids were found during retroflexion. The hemorrhoids       were small.      The bowel prep was adequate but several areas took several minutes to       lavage to achieve adequate views. The colon was spastic and there was       very poor air retention in the left colon which prolonged the exam. The       exam was otherwise without abnormality. No blood in the colon. Impression:               - The examined portion  of the ileum was normal.                           - One 5 mm polyp in the cecum, removed with a cold                            snare. Resected and retrieved.                           - Diverticulosis in the sigmoid colon.                           - Internal hemorrhoids.                           - Poor air retention in the left colon with                            associated colonic spasm.                           - The examination was otherwise normal.                           No cause for bleeding in the colon, which is most                            likely due to the patient's gastric and small bowel                            AVMs that were noted on the exam yesterday and                            treated. Possible the patient has additional AVMs  in the more distal small bowel. Recommendation:           - Return to the hospital ward for ongoing care                           - Resume previous diet.                           - Continue present medications.                           - Await pathology results.                           - Await course post enteroscopy with ablation of                            AVMs. If patient has rebleeding would consider                            capsule endoscopy.                           - Monitor for recurrent bleeding                           - GI service will sign off for now, please call                            with questions moving forward or recurrent bleeding Procedure Code(s):        --- Professional ---                           [phone], Colonoscopy, flexible; with removal of                            tumor(s), polyp(s), or other lesion(s) by snare                            technique Diagnosis Code(s):        --- Professional ---                           K64.8, Other hemorrhoids                           K63.5, Polyp of colon                           K92.2, Gastrointestinal hemorrhage,  unspecified                           K57.30, Diverticulosis of large intestine without                            perforation or abscess without bleeding CPT copyright 2019 American Medical Association. All rights reserved. The codes documented in this  report are preliminary and upon coder review may  be revised to meet current compliance requirements. Remo Lipps P. Nichalos Brenton, MD 05/25/2020 8:48:19 AM This report has been signed electronically. Number of Addenda: 0

## 2020-05-25 NOTE — Progress Notes (Signed)
Report given to [place], Therapist, sports. Patient is alert and oriented x4 preparing to leave floor to go to Endo for colonoscopy. AM and PM RNs at bedside.  Plan is for hemodialysis later today.  Hgb 9.8, K+ 3.9 and Creat 6.64 this AM.

## 2020-05-25 NOTE — Interval H&P Note (Signed)
History and Physical Interval Note: Patient underwent push enteroscopy with ablation of AVMs yesterday. Colonoscopy scheduled for today to rule out any colonic source to her bleeding / anemia. I have discussed risks / benefits of the procedure and anesthesia with her, she wants to proceed. Hgb stable. Patient states she no longer takes Plavix and has not taken it within the past week. Further recommendations pending the results of this exam.   05/25/2020 7:45 AM  [name]  has presented today for surgery, with the diagnosis of Dark, FOBT positive stools and acute on chronic anemia.  The various methods of treatment have been discussed with the patient and family. After consideration of risks, benefits and other options for treatment, the patient has consented to  Procedure(s): COLONOSCOPY WITH PROPOFOL (N/A) as a surgical intervention.  The patient's history has been reviewed, patient examined, no change in status, stable for surgery.  I have reviewed the patient's chart and labs.  Questions were answered to the patient's satisfaction.     [place]

## 2020-05-25 NOTE — Anesthesia Preprocedure Evaluation (Signed)
Anesthesia Evaluation  Patient identified by MRN, date of birth, ID band Patient awake    Reviewed: Allergy & Precautions, H&P , NPO status , Patient's Chart, lab work & pertinent test results  Airway Mallampati: II  TM Distance: >3 FB Neck ROM: Full    Dental no notable dental hx. (+) Teeth Intact, Dental Advisory Given   Pulmonary asthma ,    Pulmonary exam normal breath sounds clear to auscultation       Cardiovascular Exercise Tolerance: Good hypertension, Pt. on home beta blockers + Peripheral Vascular Disease and +CHF  + Valvular Problems/Murmurs AS and MR  Rhythm:Regular Rate:Normal     Neuro/Psych  Headaches, Anxiety    GI/Hepatic Neg liver ROS, PUD, GERD  Medicated,  Endo/Other  diabetes, Type 2, Insulin Dependent  Renal/GU ESRFRenal disease  negative genitourinary   Musculoskeletal  (+) Arthritis , Fibromyalgia -  Abdominal Normal abdominal exam  (+)   Peds  Hematology  (+) Blood dyscrasia, anemia ,   Anesthesia Other Findings   Reproductive/Obstetrics negative OB ROS                             Anesthesia Physical  Anesthesia Plan  ASA: III  Anesthesia Plan: MAC   Post-op Pain Management:    Induction: Intravenous  PONV Risk Score and Plan: 2 and Propofol infusion, Treatment may vary due to age or medical condition and TIVA  Airway Management Planned: Nasal Cannula, Natural Airway and Simple Face Mask  Additional Equipment: None  Intra-op Plan:   Post-operative Plan:   Informed Consent: I have reviewed the patients History and Physical, chart, labs and discussed the procedure including the risks, benefits and alternatives for the proposed anesthesia with the patient or authorized representative who has indicated his/her understanding and acceptance.     Dental advisory given  Plan Discussed with: CRNA  Anesthesia Plan Comments:         Anesthesia  Quick Evaluation

## 2020-05-25 NOTE — TOC Progression Note (Signed)
Transition of Care [facility]) - Progression Note    Patient Details  Name: [name] MRN: [id number] Date of Birth: 1951-06-26  Transition of Care [facility] [place]) CM/SW Contact  Bartholomew Crews, RN Phone Number: [phone] 05/25/2020, 12:34 PM  Clinical Narrative:     Patient to transition home after hemodialysis today. Spoke with patient's sister, [name], about transportation home. [name] states that she will provide transportation home. Nursing to advise [facility] when patient is ready for discharge. TOC following for transition needs.       Expected Discharge Plan and Services           Expected Discharge Date: 05/25/20                                     Social Determinants of Health (SDOH) Interventions    Readmission Risk Interventions Readmission Risk Prevention Plan 11/18/2019 10/24/2019 10/21/2019  Transportation Screening Complete - Complete  PCP or Specialist Appt within 3-5 Days - - Complete  HRI or [place] - - Complete  Social Work Consult for [place] Planning/Counseling - - Complete  Palliative Care Screening - - Complete  Medication Review Press photographer) Complete - Complete  PCP or Specialist appointment within 3-5 days of discharge Complete Complete -  [place] or Home Care Consult Complete Complete -  SW Recovery Care/Counseling Consult Complete Complete -  Palliative Care Screening Complete Not Applicable -  [place] Complete Patient Refused -  Some recent data might be hidden

## 2020-05-25 NOTE — Discharge Instructions (Signed)

## 2020-05-25 NOTE — Discharge Summary (Signed)
Physician Discharge Summary  [name] MHD:[id number] DOB: 1951-02-02 DOA: 05/22/2020  PCP: Prince Solian, MD  Admit date: 05/22/2020 Discharge date: 05/25/2020  Time spent: 23 minutes  Recommendations for Outpatient Follow-up:  1. Discontinued aspirin this admission would not resume unless absolutely necessary 2. Continue home dosage of PPI and will need outpatient capsule study if further bleeding or low hemoglobin 3. Consider outpatient imaging of liver given some liver pathology found on MRI  Discharge Diagnoses:  Principal Problem:   Upper GI bleed Active Problems:   GERD (gastroesophageal reflux disease)   ESRD on dialysis ([place])   Blindness of both eyes   Symptomatic anemia   Anemia in ESRD (end-stage renal disease) (HCC)   Acquired absence of eye   Acute blood loss anemia   GI bleed   Benign neoplasm of colon   Discharge Condition: Improved  Diet recommendation: Heart healthy   Filed Weights   05/24/20 0255 05/24/20 0901 05/25/20 0320  Weight: 73.3 kg 73.3 kg 71.6 kg    History of present illness:  69 year old black female ESRD on HD PAD status post bilateral AKA status post--right AKA 08/1192 --- complications of left BKA stump 08/30/2019 and irrigation debridement 09/2019--- she is currently off of Plavix secondary to intermittent and recurrent GI bleed HTN EF 45-50% based on echo 10/30/2016 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 Probable upper GI  bleed a. Bleeding seems to have stopped arguing against massive bleed b. Had colonoscopy showing polyp in sigmoid colon, upper endoscopy showed 6 mm core angiodysplastic lesion which was coagulated c. Was on Protonix drip earlier in admission transition back to her Prilosec on discharge d. Outpatient capsule the further concerns for any bleeding or heat anemia 2. hemorrhagic shock on admission 3. Anemia of acute blood loss a. Secondary to bleeding which seems to have slowly resolved b. Hold fluids for now given dialysis state and no hypotension 4. ESRD/HD a. Defer to nephrology further planning b. All meds on hold cautiously resumed post procedure Hectorol, Phos renal iron aspirin etc. etc. 5. EF 45-50% a. Continue Coreg on discharge was held during hospital stay 6. DM TY 2 1/nephropathy neuropathy blindness a. CBGs 90-1 30 b. Continue very sensitive sliding scale 7. prior bilateral AKA's with complications will need a. Further outpatient management 8. ?  Right liver lesion a. Needs dedicated imaging in the next several months once results   Procedures: Endoscopy 11/4 and colonoscopy 11/5 Colonoscopy showed 5 mm polyp in cecum removed with cold snare and diverticulosis with nonbleeding internal hemorrhoids Endoscopy showed angiodysplastic lesion without bleeding in stomach which was coagulated with APC  Consultations:  GI  Nephrology  Discharge Exam: Vitals:   05/25/20 0900 05/25/20 0920  BP: 113/66 (!) 120/49  Pulse: 77 80  Resp: (!) 23 17  Temp:  98 F (36.7 C)  SpO2: 95% 96%    General: Awake alert pleasant on oxygen no distress EOMI NCAT Cardiovascular: S1-S2 no murmur rub or gallop Respiratory: Clear no rales or rhonchi Abdomen soft no rebound no guarding no lower  extremity edema I Areas of the  AKA's are clean  Discharge Instructions   Discharge Instructions    Diet - low sodium heart healthy   Complete by: As directed    Discharge instructions    Complete by: As directed    No aspirin on discharge Monitor your blood pressures going forwards--u may need a lower dose of some of your blood pressure meds depending on planning Get labs in a week Report any further bleeding or other issues to ur stomach doctor and you may need to have other testing for this ijn the OP setting   Increase activity slowly   Complete by: As directed    No wound care   Complete by: As directed      Allergies as of 05/25/2020      Reactions   Tape Itching, Rash   71M Transpore adhesive tape. Medical tape pulls off the skin!! PAPER TAPE ONLY, PLEASE   Latex Hives   Oxycodone Other (See Comments)   Hallucinations    Tramadol Other (See Comments)   Hallucinations with a full tablet   Morphine And Related Other (See Comments)   Hallucinations   Vicodin [hydrocodone-acetaminophen] Other (See Comments)   Hallucinations      Medication List    STOP taking these medications   aspirin EC 81 MG tablet     TAKE these medications   albuterol 108 (90 Base) MCG/ACT inhaler Commonly known as: VENTOLIN HFA Inhale 2 puffs into the lungs every 6 (six) hours as needed for wheezing or shortness of breath.   albuterol (2.5 MG/71ML) 0.083% nebulizer solution Commonly known as: PROVENTIL Take 3 mLs (2.5 mg total) by nebulization every 6 (six) hours as needed for wheezing or shortness of breath.   atropine 1 % ophthalmic solution Place 1 drop into the right eye 2 (two) times daily.   BD Pen Needle Nano 2nd Gen 32G X 4 MM Misc Generic drug: Insulin Pen Needle in the morning, at noon, and at bedtime.   carvedilol 6.25 MG tablet Commonly known as: COREG Take 1 tablet (6.25 mg total) by mouth 2 (two) times daily.   darbepoetin 200 MCG/0.4ML Soln injection Commonly known as: ARANESP Inject 0.4 mLs (200 mcg total) into the vein every Wednesday with hemodialysis.   diclofenac sodium 1 % Gel Commonly known as: VOLTAREN Apply 2 g topically 4 (four) times daily as  needed (pain).   diphenhydrAMINE 25 MG tablet Commonly known as: BENADRYL Take 25 mg by mouth in the morning, at noon, and at bedtime.   doxercalciferol 4 MCG/2ML injection Commonly known as: HECTOROL Inject 0.5 mLs (1 mcg total) into the vein every Monday, Wednesday, and Friday with hemodialysis.   gabapentin 100 MG capsule Commonly known as: NEURONTIN Take 100 mg by mouth 2 (two) times daily.   lanthanum 1000 MG chewable tablet Commonly known as: FOSRENOL Chew 2,000-4,000 mg by mouth See admin instructions. Chew 4,000 mg by mouth with each meal and 2,000 mg with each snack   multivitamin Tabs tablet Take 1 tablet by mouth daily.   NovoLOG FlexPen 100 UNIT/ML FlexPen Generic drug: insulin aspart Inject 2-10 Units into the skin 2 (two) times daily with a meal.   omeprazole 40 MG capsule Commonly known as: PRILOSEC Take 1 capsule (40 mg total) by mouth daily. What changed: when to take this   OneTouch Verio test strip Generic drug: glucose blood 1 each by Other route 3 (three) times daily.   prednisoLONE acetate 1 % ophthalmic  suspension Commonly known as: PRED FORTE Place 1 drop into the right eye 2 (two) times daily.   traMADol 50 MG tablet Commonly known as: Ultram Take 1 tablet (50 mg total) by mouth every 6 (six) hours as needed.      Allergies  Allergen Reactions  . Tape Itching and Rash    19M Transpore adhesive tape. Medical tape pulls off the skin!! PAPER TAPE ONLY, PLEASE  . Latex Hives  . Oxycodone Other (See Comments)    Hallucinations   . Tramadol Other (See Comments)    Hallucinations with a full tablet  . Morphine And Related Other (See Comments)    Hallucinations   . Vicodin [Hydrocodone-Acetaminophen] Other (See Comments)    Hallucinations       The results of significant diagnostics from this hospitalization (including imaging, microbiology, ancillary and laboratory) are listed below for reference.    Significant Diagnostic Studies: CT  Angio Abd/Pel w/ and/or w/o  Result Date: 05/22/2020 CLINICAL DATA:  GI bleed with hemoglobin drop. EXAM: CTA ABDOMEN AND PELVIS WITHOUT AND WITH CONTRAST TECHNIQUE: Multidetector CT imaging of the abdomen and pelvis was performed using the standard protocol during bolus administration of intravenous contrast. Multiplanar reconstructed images and MIPs were obtained and reviewed to evaluate the vascular anatomy. CONTRAST:  160mL OMNIPAQUE IOHEXOL 350 MG/ML SOLN COMPARISON:  Most recent abdominal CT 08/28/2016 FINDINGS: VASCULAR Aorta: Moderately advanced atherosclerosis with primarily calcified plaque. No dissection or aneurysm. No severe stenosis. Celiac: Minimal stenosis at the origin due to calcified plaque. No poststenotic dilatation. Branch vessels are moderately calcified. No dissection or acute findings. SMA: Mixed calcified noncalcified atheromatous plaque with irregular plaque involving the proximal SMA. Distal branches are densely calcified. No evidence of severe stenosis or propped occlusion. Renals: Calcified plaque at the origin of both renal arteries with mild stenosis. Peripheral branches are patent but calcified. IMA: Stenotic at the origin with mild hypertrophy distally. Inflow: Densely calcified without severe stenosis or acute findings. Proximal Outflow: Densely calcified. Irregular calcifications project into the lumen of the proximal right superficial femoral artery causing greater than 50% stenosis. Veins: Portal venous phase obtained, portal vein and mesenteric vessels are patent. No evidence of acute thrombus. Limited assessment of the IVC and iliac vasculature due to contrast bolus timing. Review of the MIP images confirms the above findings. NON-VASCULAR Lower chest: Multi chamber cardiomegaly. Contrast refluxes into the hepatic veins and IVC consistent with elevated right heart pressures. Ground-glass densities in the lung bases may represent pulmonary edema. Trace left pleural effusion.  Hepatobiliary: The liver is enlarged spanning 22 cm cranial caudal. There is subcapsular low-density involving the right lobe measuring approximately 3.5 cm best appreciated on portal venous phase imaging. Slight capsular retraction. This is not faintly visualized on prior exam. Decompressed gallbladder. No calcified gallstone. Pancreas: Parenchymal atrophy. No ductal dilatation or inflammation. Spleen: Normal in size without focal abnormality. Small splenule inferiorly. Adrenals/Urinary Tract: Left adrenal thickening without dominant nodule. Normal right adrenal gland. Bilateral renal parenchymal atrophy with lobulated contours. Probable small cyst in the mid left kidney. No hydronephrosis. Urinary bladder is near completely empty. Stomach/Bowel: Please note there is high-density material throughout the stomach, small and large bowel. This limits detection of acute contrast extravasation. There is no obvious localization of contrast in the GI tract to localize site of GI bleed, evaluations significantly limited. There is no bowel inflammation or wall thickening. Normal appendix. Lymphatic: No abdominopelvic adenopathy. Reproductive: Status post hysterectomy. No adnexal masses. Other: Small amount of free fluid  in the dependent pelvis. No free air. Small fat containing umbilical hernia. Musculoskeletal: Bones are diffusely under mineralized. There are no acute or suspicious osseous abnormalities. IMPRESSION: 1. Please note assessment for contrast extravasation in the GI track is limited by high density material throughout the entire bowel. Allowing for this limitation, there is no obvious localization of GI bleed. No evidence of acute bowel inflammation. 2. Advanced aortic and branch atherosclerosis with predominantly calcified plaque. No evidence of acute vascular finding. Areas of branch vessel stenosis as described above. 3. Cardiomegaly with contrast refluxing into the hepatic veins and IVC suggesting elevated  right heart pressures. Probable pulmonary edema at the lung bases. Trace left pleural effusion. 4. Indeterminate lesion in the right lobe of the liver. This was faintly seen on prior noncontrast exam. Consider further evaluation with hepatic protocol MRI after resolution of acute event. Aortic Atherosclerosis (ICD10-I70.0). Electronically Signed   By: Keith Rake M.D.   On: 05/22/2020 18:21    Microbiology: Recent Results (from the past 240 hour(s))  Resp Panel by RT PCR (RSV, Flu A&B, Covid) - Nasopharyngeal Swab     Status: None   Collection Time: 05/22/20  3:34 PM   Specimen: Nasopharyngeal Swab  Result Value Ref Range Status   SARS Coronavirus 2 by RT PCR NEGATIVE NEGATIVE Final    Comment: (NOTE) SARS-CoV-2 target nucleic acids are NOT DETECTED.  The SARS-CoV-2 RNA is generally detectable in upper respiratoy specimens during the acute phase of infection. The lowest concentration of SARS-CoV-2 viral copies this assay can detect is 131 copies/mL. A negative result does not preclude SARS-Cov-2 infection and should not be used as the sole basis for treatment or other patient management decisions. A negative result may occur with  improper specimen collection/handling, submission of specimen other than nasopharyngeal swab, presence of viral mutation(s) within the areas targeted by this assay, and inadequate number of viral copies (<131 copies/mL). A negative result must be combined with clinical observations, patient history, and epidemiological information. The expected result is Negative.  Fact Sheet for Patients:  PinkCheek.be  Fact Sheet for Healthcare Providers:  GravelBags.it  This test is no t yet approved or cleared by the [place] FDA and  has been authorized for detection and/or diagnosis of SARS-CoV-2 by FDA under an Emergency Use Authorization (EUA). This EUA will remain  in effect (meaning this test can  be used) for the duration of the COVID-19 declaration under Section 564(b)(1) of the Act, 21 U.S.C. section 360bbb-3(b)(1), unless the authorization is terminated or revoked sooner.     Influenza A by PCR NEGATIVE NEGATIVE Final   Influenza B by PCR NEGATIVE NEGATIVE Final    Comment: (NOTE) The Xpert Xpress SARS-CoV-2/FLU/RSV assay is intended as an aid in  the diagnosis of influenza from Nasopharyngeal swab specimens and  should not be used as a sole basis for treatment. Nasal washings and  aspirates are unacceptable for Xpert Xpress SARS-CoV-2/FLU/RSV  testing.  Fact Sheet for Patients: PinkCheek.be  Fact Sheet for Healthcare Providers: GravelBags.it  This test is not yet approved or cleared by the [place] FDA and  has been authorized for detection and/or diagnosis of SARS-CoV-2 by  FDA under an Emergency Use Authorization (EUA). This EUA will remain  in effect (meaning this test can be used) for the duration of the  Covid-19 declaration under Section 564(b)(1) of the Act, 21  U.S.C. section 360bbb-3(b)(1), unless the authorization is  terminated or revoked.    Respiratory Syncytial Virus by PCR  NEGATIVE NEGATIVE Final    Comment: (NOTE) Fact Sheet for Patients: PinkCheek.be  Fact Sheet for Healthcare Providers: GravelBags.it  This test is not yet approved or cleared by the [place] FDA and  has been authorized for detection and/or diagnosis of SARS-CoV-2 by  FDA under an Emergency Use Authorization (EUA). This EUA will remain  in effect (meaning this test can be used) for the duration of the  COVID-19 declaration under Section 564(b)(1) of the Act, 21 U.S.C.  section 360bbb-3(b)(1), unless the authorization is terminated or  revoked. Performed at [facility], [place] [address] [place], [place] [address]   MRSA PCR Screening     Status:  Abnormal   Collection Time: 05/23/20 12:54 AM   Specimen: Nasal Mucosa; Nasopharyngeal  Result Value Ref Range Status   MRSA by PCR POSITIVE (A) NEGATIVE Final    Comment:        The GeneXpert MRSA Assay (FDA approved for NASAL specimens only), is one component of a comprehensive MRSA colonization surveillance program. It is not intended to diagnose MRSA infection nor to guide or monitor treatment for MRSA infections. RESULT CALLED TO, READ BACK BY AND VERIFIED WITH: SCITIO,D RN 05/23/2020 AT 0258 SKEEN,P Performed at [facility], [place] [address], [place], [place] [address]      Labs: Basic Metabolic Panel: Recent Labs  Lab 05/22/20 1323 05/22/20 1414 05/23/20 0050 05/24/20 0400 05/25/20 0125  NA 139 139 136 138 140  K 5.1 4.5 4.8 3.7 3.9  CL 100 100 96* 97* 101  CO2 25  --  25 29 20*  GLUCOSE 113* 103* 137* 92 113*  BUN 30* 37* 36* 15 21  CREATININE 6.40* 6.70* 7.30* 4.44* 6.64*  CALCIUM 8.0*  --  8.4* 8.3* 8.6*   Liver Function Tests: Recent Labs  Lab 05/22/20 1323 05/23/20 0050 05/24/20 0400 05/25/20 0125  AST 53* 18 29 27   ALT 20 14 19 20   ALKPHOS 54 59 72 76  BILITOT 1.0 0.9 0.8 1.1  PROT 5.5* 5.8* 6.5 6.1*  ALBUMIN 2.7* 2.9* 3.0* 3.0*   No results for input(s): LIPASE, AMYLASE in the last 168 hours. No results for input(s): AMMONIA in the last 168 hours. CBC: Recent Labs  Lab 05/22/20 1323 05/22/20 1323 05/22/20 1414 05/22/20 2200 05/23/20 0050 05/24/20 0400 05/25/20 0125  WBC 8.9  --   --  7.1 9.0 8.5 9.5  NEUTROABS 6.5  --   --   --   --  6.1 7.0  HGB 5.1*   < > 6.1* 7.6* 8.5* 9.5* 9.8*  HCT 17.7*   < > 18.0* 24.1* 26.0* 28.9* 31.2*  MCV 111.3*  --   --  98.4 96.3 98.3 101.3*  PLT 190  --   --  160 228 250 252   < > = values in this interval not displayed.   Cardiac Enzymes: No results for input(s): CKTOTAL, CKMB, CKMBINDEX, TROPONINI in the last 168 hours. BNP: BNP (last 3 results) No results for input(s): BNP in the last  8760 hours.  ProBNP (last 3 results) No results for input(s): PROBNP in the last 8760 hours.  CBG: Recent Labs  Lab 05/24/20 1203 05/24/20 1649 05/24/20 2046 05/25/20 0735 05/25/20 0848  GLUCAP 176* 115* 98 94 88       Signed:  Nita Sells MD   [facility] 05/25/2020, 11:30 AM

## 2020-05-25 NOTE — Transfer of Care (Signed)
Immediate Anesthesia Transfer of Care Note  Patient: [name]  Procedure(s) Performed: COLONOSCOPY WITH PROPOFOL (N/A ) POLYPECTOMY  Patient Location: Endoscopy Unit  Anesthesia Type:MAC  Level of Consciousness: drowsy  Airway & Oxygen Therapy: [name]  Post-op Assessment: Report given to RN and Post -op Vital signs reviewed and stable  Post vital signs: Reviewed  Last Vitals:  Vitals Value Taken Time  BP 108/42 05/25/20 0849  Temp    Pulse 74 05/25/20 0849  Resp 15 05/25/20 0849  SpO2 97 % 05/25/20 0849  Vitals shown include unvalidated device data.  Last Pain:  Vitals:   05/25/20 0728  TempSrc: Oral  PainSc: 0-No pain         Complications: No complications documented.

## 2020-05-25 NOTE — Progress Notes (Signed)
Patient off unit to Endo for colonoscopy. Patient accompanied by 2 RNs, VSS.

## 2020-05-25 NOTE — Anesthesia Procedure Notes (Signed)
Procedure Name: MAC Date/Time: 05/25/2020 8:10 AM Performed by: Janene Harvey, CRNA Pre-anesthesia Checklist: Patient identified, Emergency Drugs available, Suction available and Patient being monitored Patient Re-evaluated:Patient Re-evaluated prior to induction Oxygen Delivery Method: Simple face mask Preoxygenation: Pre-oxygenation with 100% oxygen Induction Type: IV induction Placement Confirmation: positive ETCO2 Dental Injury: Teeth and Oropharynx as per pre-operative assessment

## 2020-05-26 ENCOUNTER — Telehealth: Payer: Self-pay | Admitting: Nurse Practitioner

## 2020-05-26 NOTE — Telephone Encounter (Signed)
Transition of care contact from inpatient facility  Date of discharge: 05/25/2020 Date of contact: 05/26/2020 Method: Phone Spoke to: Patient's Daughter Elmyra Ricks  Patient contacted to discuss transition of care from recent inpatient hospitalization. Patient was admitted to [facility] from 11/02-11/11/2019 with discharge diagnosis of GIB.   Medication changes were reviewed. Patient has concern about being on Tramadol and is upset. She says Tramadol upsets her stomach and blames this drug for her hospitalization. Dc'd tramadol, called HD center and gave them the order to DC drug.   Patient will follow up with his/her outpatient HD unit on: 05/28/2020

## 2020-05-28 ENCOUNTER — Encounter (HOSPITAL_COMMUNITY): Payer: Self-pay | Admitting: Gastroenterology

## 2020-05-28 DIAGNOSIS — N2581 Secondary hyperparathyroidism of renal origin: Secondary | ICD-10-CM | POA: Diagnosis not present

## 2020-05-28 DIAGNOSIS — N186 End stage renal disease: Secondary | ICD-10-CM | POA: Diagnosis not present

## 2020-05-28 DIAGNOSIS — D509 Iron deficiency anemia, unspecified: Secondary | ICD-10-CM | POA: Diagnosis not present

## 2020-05-28 DIAGNOSIS — Z992 Dependence on renal dialysis: Secondary | ICD-10-CM | POA: Diagnosis not present

## 2020-05-28 DIAGNOSIS — D631 Anemia in chronic kidney disease: Secondary | ICD-10-CM | POA: Diagnosis not present

## 2020-05-29 DIAGNOSIS — F419 Anxiety disorder, unspecified: Secondary | ICD-10-CM | POA: Diagnosis not present

## 2020-05-29 DIAGNOSIS — D649 Anemia, unspecified: Secondary | ICD-10-CM | POA: Diagnosis not present

## 2020-05-29 DIAGNOSIS — I110 Hypertensive heart disease with heart failure: Secondary | ICD-10-CM | POA: Diagnosis not present

## 2020-05-29 DIAGNOSIS — K219 Gastro-esophageal reflux disease without esophagitis: Secondary | ICD-10-CM | POA: Diagnosis not present

## 2020-05-29 DIAGNOSIS — N186 End stage renal disease: Secondary | ICD-10-CM | POA: Diagnosis not present

## 2020-05-29 DIAGNOSIS — E1122 Type 2 diabetes mellitus with diabetic chronic kidney disease: Secondary | ICD-10-CM | POA: Diagnosis not present

## 2020-05-29 LAB — SURGICAL PATHOLOGY

## 2020-05-30 DIAGNOSIS — D509 Iron deficiency anemia, unspecified: Secondary | ICD-10-CM | POA: Diagnosis not present

## 2020-05-30 DIAGNOSIS — N2581 Secondary hyperparathyroidism of renal origin: Secondary | ICD-10-CM | POA: Diagnosis not present

## 2020-05-30 DIAGNOSIS — D631 Anemia in chronic kidney disease: Secondary | ICD-10-CM | POA: Diagnosis not present

## 2020-05-30 DIAGNOSIS — Z992 Dependence on renal dialysis: Secondary | ICD-10-CM | POA: Diagnosis not present

## 2020-05-30 DIAGNOSIS — N186 End stage renal disease: Secondary | ICD-10-CM | POA: Diagnosis not present

## 2020-05-31 ENCOUNTER — Other Ambulatory Visit: Payer: Self-pay | Admitting: *Deleted

## 2020-05-31 DIAGNOSIS — D649 Anemia, unspecified: Secondary | ICD-10-CM | POA: Diagnosis not present

## 2020-05-31 DIAGNOSIS — E1122 Type 2 diabetes mellitus with diabetic chronic kidney disease: Secondary | ICD-10-CM | POA: Diagnosis not present

## 2020-05-31 DIAGNOSIS — K219 Gastro-esophageal reflux disease without esophagitis: Secondary | ICD-10-CM | POA: Diagnosis not present

## 2020-05-31 DIAGNOSIS — I110 Hypertensive heart disease with heart failure: Secondary | ICD-10-CM | POA: Diagnosis not present

## 2020-05-31 DIAGNOSIS — F419 Anxiety disorder, unspecified: Secondary | ICD-10-CM | POA: Diagnosis not present

## 2020-05-31 DIAGNOSIS — N186 End stage renal disease: Secondary | ICD-10-CM | POA: Diagnosis not present

## 2020-05-31 NOTE — Patient Outreach (Signed)
[facility]) Care Management  05/31/2020  [name] [birth year] [id number]   Telephone Assessment-Unsuccessful  RN attempted outreach today however unsuccessful. RN only able to leave a HIPAA approved voice message requesting a call back.   Will rescheduled another outreach call for ongoing [facility] services.  [name], RN Care Management Coordinator [place] Office [phone]

## 2020-06-01 DIAGNOSIS — D509 Iron deficiency anemia, unspecified: Secondary | ICD-10-CM | POA: Diagnosis not present

## 2020-06-01 DIAGNOSIS — Z992 Dependence on renal dialysis: Secondary | ICD-10-CM | POA: Diagnosis not present

## 2020-06-01 DIAGNOSIS — N186 End stage renal disease: Secondary | ICD-10-CM | POA: Diagnosis not present

## 2020-06-01 DIAGNOSIS — N2581 Secondary hyperparathyroidism of renal origin: Secondary | ICD-10-CM | POA: Diagnosis not present

## 2020-06-01 DIAGNOSIS — D631 Anemia in chronic kidney disease: Secondary | ICD-10-CM | POA: Diagnosis not present

## 2020-06-02 DIAGNOSIS — D649 Anemia, unspecified: Secondary | ICD-10-CM | POA: Diagnosis not present

## 2020-06-02 DIAGNOSIS — N186 End stage renal disease: Secondary | ICD-10-CM | POA: Diagnosis not present

## 2020-06-02 DIAGNOSIS — E1122 Type 2 diabetes mellitus with diabetic chronic kidney disease: Secondary | ICD-10-CM | POA: Diagnosis not present

## 2020-06-02 DIAGNOSIS — I110 Hypertensive heart disease with heart failure: Secondary | ICD-10-CM | POA: Diagnosis not present

## 2020-06-02 DIAGNOSIS — K219 Gastro-esophageal reflux disease without esophagitis: Secondary | ICD-10-CM | POA: Diagnosis not present

## 2020-06-02 DIAGNOSIS — F419 Anxiety disorder, unspecified: Secondary | ICD-10-CM | POA: Diagnosis not present

## 2020-06-04 ENCOUNTER — Encounter: Payer: Self-pay | Admitting: *Deleted

## 2020-06-04 DIAGNOSIS — D509 Iron deficiency anemia, unspecified: Secondary | ICD-10-CM | POA: Diagnosis not present

## 2020-06-04 DIAGNOSIS — D631 Anemia in chronic kidney disease: Secondary | ICD-10-CM | POA: Diagnosis not present

## 2020-06-04 DIAGNOSIS — N2581 Secondary hyperparathyroidism of renal origin: Secondary | ICD-10-CM | POA: Diagnosis not present

## 2020-06-04 DIAGNOSIS — Z992 Dependence on renal dialysis: Secondary | ICD-10-CM | POA: Diagnosis not present

## 2020-06-04 DIAGNOSIS — N186 End stage renal disease: Secondary | ICD-10-CM | POA: Diagnosis not present

## 2020-06-05 ENCOUNTER — Ambulatory Visit: Payer: PRIVATE HEALTH INSURANCE | Admitting: Physician Assistant

## 2020-06-06 ENCOUNTER — Other Ambulatory Visit: Payer: Self-pay | Admitting: *Deleted

## 2020-06-06 DIAGNOSIS — Z992 Dependence on renal dialysis: Secondary | ICD-10-CM | POA: Diagnosis not present

## 2020-06-06 DIAGNOSIS — N186 End stage renal disease: Secondary | ICD-10-CM | POA: Diagnosis not present

## 2020-06-06 DIAGNOSIS — N2581 Secondary hyperparathyroidism of renal origin: Secondary | ICD-10-CM | POA: Diagnosis not present

## 2020-06-06 DIAGNOSIS — D631 Anemia in chronic kidney disease: Secondary | ICD-10-CM | POA: Diagnosis not present

## 2020-06-06 DIAGNOSIS — D509 Iron deficiency anemia, unspecified: Secondary | ICD-10-CM | POA: Diagnosis not present

## 2020-06-06 NOTE — Patient Outreach (Signed)
[facility]) Care Management  06/06/2020  [name] [birth year] [id number]   Telephone Assessment: Successful Transfer to a Health Coach for ongoing disease management of care related to Diabetes  Spoke with pt's sister [name]) concerning update on pt's ongoing care. Pt progress well toward her ongoing recover. Reports recent CBG from 99-109 with no hyper-hypoglycemia even with her ongoing dialysis on M-W-F. Reports pt tolerating more proteins and low carbohydrate with her dietary intake. Adherence to the plan of care discussed today with all goals met and no acute events over the months. Pt adherent with all interventions. Discussed transfer to a health coach for ongoing management of care as sister receptive. Discussed the options for monthly or quarterly contacts based upon the pt's progress.   No request or inquires at this time as sister aware Dr.Avva will be update on pt's disposition with [facility] services.   Goals Addressed            This Visit's Progress   . COMPLETED: THN-Monitor and Manage My Blood Sugar       Follow Up Date 05/31/2020   - check blood sugar at prescribed times - check blood sugar if I feel it is too high or too low - take the blood sugar log to all doctor visits - take the blood sugar meter to all doctor visits    Why is this important?   Checking your blood sugar at home helps to keep it from getting very high or very low.  Writing the results in a diary or log helps the doctor know how to care for you.  Your blood sugar log should have the time, date and the results.  Also, write down the amount of insulin or other medicine that you take.  Other information, like what you ate, exercise done and how you were feeling, will also be helpful.     Notes: Adherent to all medical appointments    . COMPLETED: THN-Set My Target A1C       Follow Up Date 05/31/2020   - set target A1C    Why is this important?   Your target A1C is  decided together by you and your doctor.  It is based on several things like your age and other health issues.    Notes: A1C 5.2 on 05/22/2020       [name], RN Care Management Coordinator [place] Office [phone]

## 2020-06-07 DIAGNOSIS — I110 Hypertensive heart disease with heart failure: Secondary | ICD-10-CM | POA: Diagnosis not present

## 2020-06-07 DIAGNOSIS — E1122 Type 2 diabetes mellitus with diabetic chronic kidney disease: Secondary | ICD-10-CM | POA: Diagnosis not present

## 2020-06-07 DIAGNOSIS — N186 End stage renal disease: Secondary | ICD-10-CM | POA: Diagnosis not present

## 2020-06-07 DIAGNOSIS — F419 Anxiety disorder, unspecified: Secondary | ICD-10-CM | POA: Diagnosis not present

## 2020-06-07 DIAGNOSIS — K219 Gastro-esophageal reflux disease without esophagitis: Secondary | ICD-10-CM | POA: Diagnosis not present

## 2020-06-07 DIAGNOSIS — D649 Anemia, unspecified: Secondary | ICD-10-CM | POA: Diagnosis not present

## 2020-06-08 ENCOUNTER — Other Ambulatory Visit: Payer: Self-pay

## 2020-06-08 ENCOUNTER — Emergency Department (HOSPITAL_COMMUNITY)
Admission: EM | Admit: 2020-06-08 | Discharge: 2020-06-08 | Disposition: A | Discharge status: Home / Self Care | Payer: Medicare Other | Attending: Emergency Medicine | Admitting: Emergency Medicine

## 2020-06-08 ENCOUNTER — Other Ambulatory Visit: Payer: Self-pay | Admitting: *Deleted

## 2020-06-08 DIAGNOSIS — D509 Iron deficiency anemia, unspecified: Secondary | ICD-10-CM | POA: Diagnosis not present

## 2020-06-08 DIAGNOSIS — D649 Anemia, unspecified: Secondary | ICD-10-CM | POA: Diagnosis not present

## 2020-06-08 DIAGNOSIS — I132 Hypertensive heart and chronic kidney disease with heart failure and with stage 5 chronic kidney disease, or end stage renal disease: Secondary | ICD-10-CM | POA: Insufficient documentation

## 2020-06-08 DIAGNOSIS — I10 Essential (primary) hypertension: Secondary | ICD-10-CM | POA: Diagnosis not present

## 2020-06-08 DIAGNOSIS — I959 Hypotension, unspecified: Secondary | ICD-10-CM | POA: Diagnosis not present

## 2020-06-08 DIAGNOSIS — Z853 Personal history of malignant neoplasm of breast: Secondary | ICD-10-CM | POA: Insufficient documentation

## 2020-06-08 DIAGNOSIS — Z91040 Latex allergy status: Secondary | ICD-10-CM | POA: Diagnosis not present

## 2020-06-08 DIAGNOSIS — D631 Anemia in chronic kidney disease: Secondary | ICD-10-CM | POA: Insufficient documentation

## 2020-06-08 DIAGNOSIS — Z794 Long term (current) use of insulin: Secondary | ICD-10-CM | POA: Diagnosis not present

## 2020-06-08 DIAGNOSIS — J45909 Unspecified asthma, uncomplicated: Secondary | ICD-10-CM | POA: Diagnosis not present

## 2020-06-08 DIAGNOSIS — R0902 Hypoxemia: Secondary | ICD-10-CM | POA: Diagnosis not present

## 2020-06-08 DIAGNOSIS — Z992 Dependence on renal dialysis: Secondary | ICD-10-CM | POA: Insufficient documentation

## 2020-06-08 DIAGNOSIS — N2581 Secondary hyperparathyroidism of renal origin: Secondary | ICD-10-CM | POA: Diagnosis not present

## 2020-06-08 DIAGNOSIS — N186 End stage renal disease: Secondary | ICD-10-CM | POA: Insufficient documentation

## 2020-06-08 DIAGNOSIS — R7889 Finding of other specified substances, not normally found in blood: Secondary | ICD-10-CM | POA: Diagnosis not present

## 2020-06-08 DIAGNOSIS — I5042 Chronic combined systolic (congestive) and diastolic (congestive) heart failure: Secondary | ICD-10-CM | POA: Diagnosis not present

## 2020-06-08 DIAGNOSIS — I120 Hypertensive chronic kidney disease with stage 5 chronic kidney disease or end stage renal disease: Secondary | ICD-10-CM | POA: Diagnosis not present

## 2020-06-08 DIAGNOSIS — E11319 Type 2 diabetes mellitus with unspecified diabetic retinopathy without macular edema: Secondary | ICD-10-CM | POA: Insufficient documentation

## 2020-06-08 LAB — BASIC METABOLIC PANEL
Anion gap: 12 (ref 5–15)
BUN: 8 mg/dL (ref 8–23)
CO2: 31 mmol/L (ref 22–32)
Calcium: 8.4 mg/dL — ABNORMAL LOW (ref 8.9–10.3)
Chloride: 97 mmol/L — ABNORMAL LOW (ref 98–111)
Creatinine, Ser: 2.79 mg/dL — ABNORMAL HIGH (ref 0.44–1.00)
GFR, Estimated: 18 mL/min — ABNORMAL LOW (ref >60)
Glucose, Bld: 88 mg/dL (ref 70–99)
Potassium: 3.1 mmol/L — ABNORMAL LOW (ref 3.5–5.1)
Sodium: 140 mmol/L (ref 135–145)

## 2020-06-08 LAB — CBC WITH DIFFERENTIAL/PLATELET
Abs Immature Granulocytes: 0.02 10*3/uL (ref 0.00–0.07)
Basophils Absolute: 0.1 10*3/uL (ref 0.0–0.1)
Basophils Relative: 1 %
Eosinophils Absolute: 0.1 10*3/uL (ref 0.0–0.5)
Eosinophils Relative: 2 %
HCT: 24.4 % — ABNORMAL LOW (ref 36.0–46.0)
Hemoglobin: 7.7 g/dL — ABNORMAL LOW (ref 12.0–15.0)
Immature Granulocytes: 0 %
Lymphocytes Relative: 21 %
Lymphs Abs: 1.6 10*3/uL (ref 0.7–4.0)
MCH: 31.4 pg (ref 26.0–34.0)
MCHC: 31.6 g/dL (ref 30.0–36.0)
MCV: 99.6 fL (ref 80.0–100.0)
Monocytes Absolute: 0.8 10*3/uL (ref 0.1–1.0)
Monocytes Relative: 10 %
Neutro Abs: 5.2 10*3/uL (ref 1.7–7.7)
Neutrophils Relative %: 66 %
Platelets: 183 10*3/uL (ref 150–400)
RBC: 2.45 MIL/uL — ABNORMAL LOW (ref 3.87–5.11)
RDW: 16.7 % — ABNORMAL HIGH (ref 11.5–15.5)
WBC: 7.8 10*3/uL (ref 4.0–10.5)
nRBC: 0 % (ref 0.0–0.2)

## 2020-06-08 LAB — TYPE AND SCREEN
ABO/RH(D): A POS
Antibody Screen: NEGATIVE

## 2020-06-08 LAB — POC OCCULT BLOOD, ED: Fecal Occult Bld: NEGATIVE

## 2020-06-08 NOTE — Discharge Instructions (Signed)
Your repeat hemoglobin is 7.7.  Please continue with your current dialysis schedule.  Follow-up with your doctor for further care.  Return if you have any concern.

## 2020-06-08 NOTE — ED Provider Notes (Signed)
[facility] EMERGENCY DEPARTMENT Provider Note   CSN: [id number] Arrival date & time: 06/08/20  1236     History Chief Complaint  Patient presents with  . low HGB    [name] is a 69 y.o. female.  The history is provided by the patient and medical records. No language interpreter was used.     69 year old female significant history of end-stage renal disease currently on dialysis, anemia, diabetes, hypertension, recent upper GI bleed requiring surgery brought here via EMS with concerns of worsening anemia.  Patient states she went to dialysis session today as scheduled.  She did inquire about her hemoglobin status and was told that it was 7.7.  She mention she was released from the hospital for upper GI bleed approximately 2 weeks ago and she wants to keep a close eye on her blood count.  She mention having bouts of diarrhea yesterday as well as have been feeling more sleepy than usual for the past several days.  The symptoms is similar to previous episodes where she was found to be anemic.  She does endorse some mild epigastric discomfort that has been an ongoing issue since her hospitalization.  She denies any significant chest pain or shortness of breath she denies any other symptoms.  No fever chills.  Patient is blind.  Past Medical History:  Diagnosis Date  . Anemia   . Anxiety   . Arthritis    "joints" (06/15/2013)  . Asthma   . 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 Blood transfusion without reported diagnosis   . Breast cancer ([place])    left  . CHF (congestive heart failure) ([place])   . 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 Esophageal ulcer with bleeding   . ESRD (end stage renal disease) ([place])    "suppose to start dialysis today" (06/15/2013)  . Family history of adverse reaction to anesthesia    It took a while for pt sister to wake from anesthesia  . GERD (gastroesophageal reflux disease)   . Glaucoma    blind in  both eyes  . Heart murmur    Mild AS, moderate MR, moderate TR 10/30/16 echo  . Hx of adenomatous colonic polyps 04/07/2018  . Hypertension   . Myalgia 12/31/2011  . Neuropathy 12/31/2011  . PAD (peripheral artery disease) (HCC)    nonviable tissue left lower extremity  . Shortness of breath    "when she doesn't go to dialysis"  . Type II diabetes mellitus ([place])    Type II    Patient Active Problem List   Diagnosis Date Noted  . Benign neoplasm of colon   . GI bleed 05/23/2020  . Upper GI bleed 05/22/2020  . Angiodysplasia of intestine with hemorrhage 01/24/2020  . Acute blood loss anemia 12/16/2019  . Hypotension 12/16/2019  . 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[place]) 11/17/2019  . Infection of amputation stump, left lower extremity ([place]) 10/19/2019  . Peripheral artery disease ([place]) 09/30/2019  . Gangrene of foot ([place]) 08/30/2019  . Other fatigue 02/28/2019  . Finger pain, left 02/03/2019  . Trigger ring finger of left hand 02/03/2019  . Osteomyelitis, unspecified ([place]) 11/16/2018  . Diabetic retinopathy associated with type 2 diabetes mellitus ([place]) 11/15/2018  . Rheumatoid factor positive 11/15/2018  . Anemia in ESRD (end-stage renal disease) ([place]) 05/06/2018  . Asthma 05/06/2018  . Hx of adenomatous colonic polyps 04/07/2018  . Status post carpal tunnel release  12/10/2017  . Carpal tunnel syndrome, left upper limb 11/10/2017  . Carpal tunnel syndrome, right upper limb 11/10/2017  . Bilateral hand numbness 09/28/2017  . Dependence on renal dialysis ([place]) 09/11/2017  . Symptomatic anemia 07/06/2017  . Hypertension 07/06/2017  . Chronic combined systolic and diastolic CHF (congestive heart failure) ([place]) 07/06/2017  . Complicated migraine 19/14/7829  . ESRD on dialysis ([place]) 06/04/2017  . Blindness of both eyes 06/04/2017  . Glaucoma 06/04/2017  . Essential hypertension 10/27/2016  . GERD (gastroesophageal reflux disease) 10/27/2016  . Hypercalcemia 05/28/2015    . Abnormal stress test 03/15/2015  . Poor venous access 02/08/2015  . Diarrhea, unspecified 11/02/2014  . Fever, unspecified 11/02/2014  . Pain, unspecified 11/02/2014  . Pruritus, unspecified 11/02/2014  . Shortness of breath 07/28/2014  . Acquired absence of eye 03/31/2014  . Coagulation defect, unspecified ([place]) 03/22/2014  . Dysuria 02/14/2014  . Encounter for immunization 01/03/2014  . Iron deficiency anemia, unspecified 10/18/2013  . Unspecified protein-calorie malnutrition ([place]) 09/23/2013  . Complication of vascular dialysis catheter 06/21/2013  . Fibromyalgia 06/21/2013  . Personal history of breast cancer 06/21/2013  . Personal history of other diseases of the musculoskeletal system and connective tissue 06/21/2013  . Type 2 diabetes mellitus with diabetic polyneuropathy ([place]) 06/21/2013  . Secondary hyperparathyroidism of renal origin ([place]) 06/20/2013  . Unspecified complication of cardiac and vascular prosthetic device, implant and graft, subsequent encounter 06/20/2013  . Type II diabetes mellitus with renal manifestations ([place]) 06/15/2013  . Neuropathy 12/31/2011  . Breast cancer of upper-inner quadrant of left female breast ([place]) 06/27/2011    Past Surgical History:  Procedure Laterality Date  . A/V FISTULAGRAM N/A 09/21/2018   Procedure: A/V FISTULAGRAM - Right Upper;  Surgeon: [name], MD;  Location: [place] CV LAB;  Service: Cardiovascular;  Laterality: N/A;  . ABDOMINAL AORTOGRAM N/A 11/30/2018   Procedure: ABDOMINAL AORTOGRAM;  Surgeon: [name], MD;  Location: [place] [facility];  Service: Cardiovascular;  Laterality: N/A;  . ABDOMINAL AORTOGRAM W/LOWER EXTREMITY Left 07/29/2019   Procedure: ABDOMINAL AORTOGRAM W/LOWER EXTREMITY;  Surgeon: [name], MD;  Location: [place] CV LAB;  Service: Cardiovascular;  Laterality: Left;  . ABDOMINAL HYSTERECTOMY     partial  . AMPUTATION Left 08/30/2019   Procedure: AMPUTATION BELOW KNEE LEFT;   Surgeon: [name], MD;  Location: [facility] OR;  Service: Vascular;  Laterality: Left;  . AMPUTATION Left 10/21/2019   Procedure: Amputation Above Knee;  Surgeon: Rosetta Posner, MD;  Location: [place];  Service: Vascular;  Laterality: Left;  . AMPUTATION Right 11/17/2019   Procedure: AMPUTATION ABOVE KNEE, right leg;  Surgeon: Rosetta Posner, MD;  Location: [facility];  Service: Vascular;  Laterality: Right;  . AV FISTULA PLACEMENT Right 06/06/2013   Procedure: ARTERIOVENOUS (AV) FISTULA CREATION-RIGHT BRACHIAL CEPHALIC;  Surgeon: Conrad Matheny, MD;  Location: [place];  Service: Vascular;  Laterality: Right;  . BREAST BIOPSY Left   . BREAST LUMPECTOMY Left    "and took out some lymph nodes" (06/15/2013)  . CARDIAC CATHETERIZATION     04/02/15 [facility]): no angiographic CAD, LVEF 40% with global hypokinesis (LHC done for + stress echo, EF 45% 11/28/14)  . CARPAL TUNNEL RELEASE Left 11/26/2017   Procedure: LEFT CARPAL TUNNEL RELEASE;  Surgeon: [name], MD;  Location: [place];  Service: Orthopedics;  Laterality: Left;  . CATARACT EXTRACTION W/ ANTERIOR VITRECTOMY Bilateral   . CESAREAN SECTION  1980  . COLONOSCOPY    .  COLONOSCOPY WITH PROPOFOL N/A 05/25/2020   Procedure: COLONOSCOPY WITH PROPOFOL;  Surgeon: Yetta Flock, MD;  Location: [place];  Service: Gastroenterology;  Laterality: N/A;  . ENTEROSCOPY N/A 12/18/2019   Procedure: ENTEROSCOPY;  Surgeon: Rush Landmark Telford Nab., MD;  Location: [place];  Service: Gastroenterology;  Laterality: N/A;  . ENTEROSCOPY N/A 05/24/2020   Procedure: ENTEROSCOPY;  Surgeon: Jackquline Denmark, MD;  Location: [facility] ENDOSCOPY;  Service: Endoscopy;  Laterality: N/A;  . ESOPHAGOGASTRODUODENOSCOPY N/A 07/08/2017   Procedure: ESOPHAGOGASTRODUODENOSCOPY (EGD);  Surgeon: [name], MD;  Location: [facility] ENDOSCOPY;  Service: Endoscopy;  Laterality: N/A;  . ESOPHAGOGASTRODUODENOSCOPY (EGD) WITH PROPOFOL N/A 02/07/2018   Procedure: ESOPHAGOGASTRODUODENOSCOPY  (EGD) WITH PROPOFOL;  Surgeon: [name], MD;  Location: [facility]COPY;  Service: Gastroenterology;  Laterality: N/A;  APC and clips placed  . EYE SURGERY Bilateral    laser surgery  . GIVENS CAPSULE STUDY N/A 12/19/2019   Procedure: GIVENS CAPSULE STUDY;  Surgeon: Irving Copas., MD;  Location: [place];  Service: Gastroenterology;  Laterality: N/A;  . HOT HEMOSTASIS N/A 12/18/2019   Procedure: HOT HEMOSTASIS (ARGON PLASMA COAGULATION/BICAP);  Surgeon: Irving Copas., MD;  Location: [place];  Service: Gastroenterology;  Laterality: N/A;  . HOT HEMOSTASIS Right 05/24/2020   Procedure: HOT HEMOSTASIS (ARGON PLASMA COAGULATION/BICAP);  Surgeon: Jackquline Denmark, MD;  Location[facility] ENDOSCOPY;  Service: Endoscopy;  Laterality: Right;  r colon  . LOWER EXTREMITY ANGIOGRAPHY Bilateral 11/30/2018   Procedure: LOWER EXTREMITY ANGIOGRAPHY;  Surgeon: [name], MD;  Location: [place] CV LAB;  Service: Cardiovascular;  Laterality: Bilateral;  . PARS PLANA VITRECTOMY Right 05/05/2017   Procedure: PARS PLANA VITRECTOMY WITH 25 GAUGE; PARTIAL REMOVAL OF OIL; INFERIOR PERIPHERAL IRIDECTOMY, REFORM ANTERIOR CHAMBER RIGHT EYE;  Surgeon: [name], MD;  Location: [place];  Service: Ophthalmology;  Laterality: Right;  . PERIPHERAL VASCULAR BALLOON ANGIOPLASTY Right 09/21/2018   Procedure: PERIPHERAL VASCULAR BALLOON ANGIOPLASTY;  Surgeon: [name], MD;  Location: [place] CV LAB;  Service: Cardiovascular;  Laterality: Right;  AV fistula  . PERIPHERAL VASCULAR BALLOON ANGIOPLASTY Left 11/30/2018   Procedure: PERIPHERAL VASCULAR BALLOON ANGIOPLASTY;  Surgeon: [name], MD;  Location: [place] CV LAB;  Service: Cardiovascular;  Laterality: Left;  Left Anterior Tibial Artery  . POLYPECTOMY  05/25/2020   Procedure: POLYPECTOMY;  Surgeon: Yetta Flock, MD;  Location: [facility] ENDOSCOPY;  Service: Gastroenterology;;  . REFRACTIVE SURGERY Bilateral   . REMOVAL OF A  DIALYSIS CATHETER Right 06/06/2013   Procedure: REMOVAL OF RIGHT MEDIPORT;  Surgeon: Conrad [place], MD;  Location: [place];  Service: Vascular;  Laterality: Right;  . SUBMUCOSAL TATTOO INJECTION  12/18/2019   Procedure: SUBMUCOSAL TATTOO INJECTION;  Surgeon: Irving Copas., MD;  Location: [place];  Service: Gastroenterology;;  . TONSILLECTOMY    . UPPER GASTROINTESTINAL ENDOSCOPY    . WOUND DEBRIDEMENT Left 09/30/2019   Procedure: IRRIGATION AND DEBRIDEMENT WOUND OF LEFT BELOW KNEE AMPUTATION;  Surgeon: [name], MD;  Location: [facility] OR;  Service: Vascular;  Laterality: Left;     OB History   No obstetric history on file.     Family History  Problem Relation Age of Onset  . Diabetes Mother   . Hyperlipidemia Mother   . Hypertension Mother   . Hypertension Father   . Diabetes Sister   . Diabetes Brother   . Hypertension Brother   . Heart attack Brother   . Kidney disease Brother   . Colon cancer Neg Hx   .  Colon polyps Neg Hx   . Esophageal cancer Neg Hx   . Gallbladder disease Neg Hx   . Rectal cancer Neg Hx   . Stomach cancer Neg Hx     Social History   Tobacco Use  . Smoking status: Never Smoker  . Smokeless tobacco: Never Used  Vaping Use  . Vaping Use: Never used  Substance Use Topics  . Alcohol use: No  . Drug use: No    Home Medications Prior to Admission medications   Medication Sig Start Date End Date Taking? Authorizing Provider  albuterol (PROVENTIL) (2.5 MG/3ML) 0.083% nebulizer solution Take 3 mLs (2.5 mg total) by nebulization every 6 (six) hours as needed for wheezing or shortness of breath. 10/30/16   Theodis Blaze, MD  albuterol (VENTOLIN HFA) 108 (90 Base) MCG/ACT inhaler Inhale 2 puffs into the lungs every 6 (six) hours as needed for wheezing or shortness of breath.     [provider]  atropine 1 % ophthalmic solution Place 1 drop into the right eye 2 (two) times daily. 07/04/17   [provider]  BD PEN NEEDLE  NANO 2ND GEN 32G X 4 MM MISC in the morning, at noon, and at bedtime.  03/03/19   [provider]  carvedilol (COREG) 6.25 MG tablet Take 1 tablet (6.25 mg total) by mouth 2 (two) times daily. 12/20/19 05/23/20  Guilford Shi, MD  darbepoetin (ARANESP) 200 MCG/0.4ML SOLN injection Inject 0.4 mLs (200 mcg total) into the vein every Wednesday with hemodialysis. 06/22/13   Geradine Girt, DO  diclofenac sodium (VOLTAREN) 1 % GEL Apply 2 g topically 4 (four) times daily as needed (pain).  04/28/19   [provider]  diphenhydrAMINE (BENADRYL) 25 MG tablet Take 25 mg by mouth in the morning, at noon, and at bedtime.     [provider]  doxercalciferol (HECTOROL) 4 MCG/2ML injection Inject 0.5 mLs (1 mcg total) into the vein every Monday, Wednesday, and Friday with hemodialysis. Patient not taking: Reported on 05/22/2020 06/18/13   Geradine Girt, DO  gabapentin (NEURONTIN) 100 MG capsule Take 100 mg by mouth 2 (two) times daily.    [provider]  insulin aspart (NOVOLOG FLEXPEN) 100 UNIT/ML FlexPen Inject 2-10 Units into the skin 2 (two) times daily with a meal.     [provider]  lanthanum (FOSRENOL) 1000 MG chewable tablet Chew 2,000-4,000 mg by mouth See admin instructions. Chew 4,000 mg by mouth with each meal and 2,000 mg with each snack    [provider]  multivitamin (RENA-VIT) TABS tablet Take 1 tablet by mouth daily. 12/20/19   [provider]  omeprazole (PRILOSEC) 40 MG capsule Take 1 capsule (40 mg total) by mouth daily. Patient taking differently: Take 40 mg by mouth 2 (two) times daily.  02/09/18   Cherene Altes, MD  [facility] VERIO test strip 1 each by Other route 3 (three) times daily.  12/15/17   [provider]  prednisoLONE acetate (PRED FORTE) 1 % ophthalmic suspension Place 1 drop into the right eye 2 (two) times daily.  04/27/17   [provider]  traMADol (ULTRAM) 50 MG tablet Take 1 tablet (50 mg  total) by mouth every 6 (six) hours as needed. Patient not taking: Reported on 05/22/2020 12/12/19   Dagoberto Ligas, PA-C    Allergies    Tape, Latex, Oxycodone, Tramadol, Morphine and related, and Vicodin [hydrocodone-acetaminophen]  Review of Systems   Review of Systems  All other  systems reviewed and are negative.   Physical Exam Updated Vital Signs LMP  (LMP Unknown)   Physical Exam Vitals and nursing note reviewed.  Constitutional:      General: She is not in acute distress.    Appearance: She is well-developed. She is obese.  HENT:     Head: Atraumatic.  Cardiovascular:     Rate and Rhythm: Normal rate and regular rhythm.     Heart sounds: Murmur heard.   Pulmonary:     Effort: Pulmonary effort is normal.     Breath sounds: Normal breath sounds. No wheezing, rhonchi or rales.  Abdominal:     Palpations: Abdomen is soft.     Tenderness: There is abdominal tenderness (Very mild epigastric tenderness without guarding or rebound tenderness).  Genitourinary:    Comments: Benjamine Mola, RN, available to chaperone.  Patient has normal rectal tone, no obvious mass, normal color stool on glove. Musculoskeletal:     Cervical back: Neck supple.     Comments: Bilateral AKA  Skin:    Findings: No rash.  Neurological:     Mental Status: She is alert. Mental status is at baseline.  Psychiatric:        Mood and Affect: Mood normal.     ED Results / Procedures / Treatments   Labs (all labs ordered are listed, but only abnormal results are displayed) Labs Reviewed  BASIC METABOLIC PANEL - Abnormal; Notable for the following components:      Result Value   Potassium 3.1 (*)    Chloride 97 (*)    Creatinine, Ser 2.79 (*)    Calcium 8.4 (*)    GFR, Estimated 18 (*)    All other components within normal limits  CBC WITH DIFFERENTIAL/PLATELET - Abnormal; Notable for the following components:   RBC 2.45 (*)    Hemoglobin 7.7 (*)    HCT 24.4 (*)    RDW 16.7 (*)    All other  components within normal limits  POC OCCULT BLOOD, ED  TYPE AND SCREEN    EKG EKG Interpretation  Date/Time:  Friday June 08 2020 13:01:06 EST Ventricular Rate:  90 PR Interval:    QRS Duration: 99 QT Interval:  399 QTC Calculation: 489 R Axis:   44 Text Interpretation: Sinus rhythm Prolonged PR interval Repol abnrm suggests ischemia, diffuse leads Confirmed by Milton Ferguson [phone]) on 06/08/2020 3:05:32 PM   Radiology No results found.  Procedures Procedures (including critical care time)  Medications Ordered in ED Medications - No data to display  ED Course  I have reviewed the triage vital signs and the nursing notes.  Pertinent labs & imaging results that were available during my care of the patient were reviewed by me and considered in my medical decision making (see chart for details).    MDM Rules/Calculators/A&P                          BP (!) 116/33   Pulse 88   Temp 98.9 F (37.2 C) (Oral)   Resp 16   Ht 5\' 5"  (1.651 m)   Wt 72.5 kg   LMP  (LMP Unknown)   SpO2 100%   BMI 26.60 kg/m   Final Clinical Impression(s) / ED Diagnoses Final diagnoses:  Anemia due to chronic kidney disease, unspecified CKD stage    Rx / DC Orders ED Discharge Orders    None     12:55 PM Patient with history of upper  GI bleed previously found on endoscopy 2 weeks ago requiring blood transfusion and surgical intervention is here with concerns of low hemoglobin.  She endorsed having increased bowel movement since yesterday without significant pain but does endorse increased sleepiness which is similar to how she presents previously.  She also was told that her hemoglobin is 7.7 and would like to have it rechecked.  At this time she is resting comfortably in no acute discomfort.  She is not hypotensive or hypoxic.  She is afebrile.  Normal heart rate.  Work-up initiated.  3:59 PM Fecal occult blood test obtained today was negative.  Labs are mostly at baseline.  She  just had her dialysis therefore potassium is mildly low at 3.1 however I would not correct that due to the underlying kidney impairment.  Her repeat hemoglobin is 7.7.  Her vital sign is stable.  At this time, I felt patient does not desire any blood transfusion emergently.  She is stable to go home and will follow up with her dialysis session as previously scheduled.  She may return if her symptoms worsen.  Patient voiced understanding and agrees to plan.  Care discussed with Dr. Roderic Palau.    Domenic Moras, PA-C 06/08/20 [id number]    Milton Ferguson, MD 06/11/20 [phone]

## 2020-06-08 NOTE — ED Notes (Signed)
HD clamp removed by Gertie Fey PA

## 2020-06-08 NOTE — ED Triage Notes (Signed)
Pt arrives via EMS from HD with complaints of low HGB. Pt has history of anemia. Lab values 9.4 to 7.7 in 1 week time. Pt reports unable to control BM X1 day. Pt finished full HD treatment today.

## 2020-06-10 DIAGNOSIS — D631 Anemia in chronic kidney disease: Secondary | ICD-10-CM | POA: Diagnosis not present

## 2020-06-10 DIAGNOSIS — N186 End stage renal disease: Secondary | ICD-10-CM | POA: Diagnosis not present

## 2020-06-10 DIAGNOSIS — Z992 Dependence on renal dialysis: Secondary | ICD-10-CM | POA: Diagnosis not present

## 2020-06-10 DIAGNOSIS — N2581 Secondary hyperparathyroidism of renal origin: Secondary | ICD-10-CM | POA: Diagnosis not present

## 2020-06-10 DIAGNOSIS — D509 Iron deficiency anemia, unspecified: Secondary | ICD-10-CM | POA: Diagnosis not present

## 2020-06-11 ENCOUNTER — Ambulatory Visit (INDEPENDENT_AMBULATORY_CARE_PROVIDER_SITE_OTHER): Admitting: Physician Assistant

## 2020-06-11 ENCOUNTER — Encounter: Payer: Self-pay | Admitting: Physician Assistant

## 2020-06-11 VITALS — BP 92/40 | HR 52

## 2020-06-11 DIAGNOSIS — I110 Hypertensive heart disease with heart failure: Secondary | ICD-10-CM | POA: Diagnosis not present

## 2020-06-11 DIAGNOSIS — E1122 Type 2 diabetes mellitus with diabetic chronic kidney disease: Secondary | ICD-10-CM | POA: Diagnosis not present

## 2020-06-11 DIAGNOSIS — R933 Abnormal findings on diagnostic imaging of other parts of digestive tract: Secondary | ICD-10-CM

## 2020-06-11 DIAGNOSIS — Q282 Arteriovenous malformation of cerebral vessels: Secondary | ICD-10-CM

## 2020-06-11 DIAGNOSIS — D649 Anemia, unspecified: Secondary | ICD-10-CM

## 2020-06-11 DIAGNOSIS — N186 End stage renal disease: Secondary | ICD-10-CM | POA: Diagnosis not present

## 2020-06-11 DIAGNOSIS — K219 Gastro-esophageal reflux disease without esophagitis: Secondary | ICD-10-CM | POA: Diagnosis not present

## 2020-06-11 DIAGNOSIS — F419 Anxiety disorder, unspecified: Secondary | ICD-10-CM | POA: Diagnosis not present

## 2020-06-11 MED ORDER — OMEPRAZOLE 40 MG PO CPDR: 40.0000 mg | DELAYED_RELEASE_CAPSULE | Freq: Every day | ORAL | 11 refills | Status: AC

## 2020-06-11 MED ORDER — LORAZEPAM 0.5 MG PO TABS: ORAL_TABLET | ORAL | 0 refills | Status: DC

## 2020-06-11 NOTE — Patient Instructions (Signed)
If you are age 68 or older, your body mass index should be between 23-30. Your There is no height or weight on file to calculate BMI. If this is out of the aforementioned range listed, please consider follow up with your Primary Care Provider.  If you are age 37 or younger, your body mass index should be between 19-25. Your There is no height or weight on file to calculate BMI. If this is out of the aformentioned range listed, please consider follow up with your Primary Care Provider.   You have been scheduled for an MRI at [facility] Radiology 1st floor on 06/21/20. Your appointment time is 8:30 am. Please arrive 15 minutes prior to your appointment time for registration purposes. Please make certain not to have anything to eat or drink 6 hours prior to your test. In addition, if you have any metal in your body, have a pacemaker or defibrillator, please be sure to let your ordering physician know. This test typically takes 45 minutes to 1 hour to complete. Should you need to reschedule, please call [phone] to do so.  Continue Omeprazole 40 mg 1 capsule every morning  You will need to pick up Lorazepam from your Pharmacy. Lorazepam 0.5 mg 1/2 tablet 30 minutes before your MRI.  Follow up pending

## 2020-06-11 NOTE — Progress Notes (Signed)
Subjective:    Patient ID: [name], female    DOB: 12/26/1950, 69 y.o.   MRN: [id number]  HPI  [name] is a pleasant 69 year old African-American female, established with Dr. Carlean Purl who comes in today for follow-up after recent hospitalization 11/2 through 05/25/2020 with recurrent anemia and melena.  She has a history of AVMs with prior ablations and multiple tubular adenomatous colon polyps. She has significant comorbidities including end-stage renal disease for which she is on dialysis, she is status post bilateral AKA amputations, has blindness, adult onset diabetes mellitus, peripheral arterial disease, heart failure and hypertension. She had presented with hemoglobin of 5.1.  Required transfusions and on discharge 05/25/2020 hemoglobin was up to 9.8.  Iron studies not consistent with iron deficiency. She had CT angiography done of the abdomen on 05/22/2020 secondary to bleeding which showed in indeterminant subcapsular lesion in the right lobe of the liver measuring 3.5 cm, enlarged liver, advanced atherosclerosis but no extravasation consistent with bleed. She underwent colonoscopy with removal of a 5 mm polyp from the cecum noted to have diverticulosis and internal hemorrhoids, no colonic AVMs noted.  Path on the polyp consistent with benign colonic tissue. 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 She had follow-up hemoglobin done 06/08/2020 with hemoglobin 7.7 hematocrit of 24.4 and stool was Hemoccult negative. She reports that her hemoglobin is generally checked every Wednesday with dialysis. She has no current complaints of abdominal pain or discomfort, no nausea or vomiting.  Her sister who helps her is not noted any melena or hematochezia.  She continues on omeprazole 40 mg 1 p.o. every morning.  Review of Systems Pertinent positive and negative review of systems were  noted in the above HPI section.  All other review of systems was otherwise negative.  Outpatient Encounter Medications as of 06/11/2020  Medication Sig  . acetaminophen (TYLENOL) 500 MG tablet Take 500 mg by mouth every 6 (six) hours as needed for headache (pain).  Marland Kitchen albuterol (PROVENTIL) (2.5 MG/3ML) 0.083% nebulizer solution Take 3 mLs (2.5 mg total) by nebulization every 6 (six) hours as needed for wheezing or shortness of breath.  Marland Kitchen albuterol (VENTOLIN HFA) 108 (90 Base) MCG/ACT inhaler Inhale 2 puffs into the lungs every 6 (six) hours as needed for wheezing or shortness of breath.   Marland Kitchen atropine 1 % ophthalmic solution Place 1 drop into the right eye 2 (two) times daily as needed (dry eyes).   . BD PEN NEEDLE NANO 2ND GEN 32G X 4 MM MISC in the morning, at noon, and at bedtime.   . carvedilol (COREG) 25 MG tablet Take 12.5 mg by mouth 2 (two) times daily with a meal.  . darbepoetin (ARANESP) 200 MCG/0.4ML SOLN injection Inject 0.4 mLs (200 mcg total) into the vein every Wednesday with hemodialysis.  Marland Kitchen diclofenac sodium (VOLTAREN) 1 % GEL Apply 2 g topically 4 (four) times daily as needed (pain).   Marland Kitchen diphenhydrAMINE (BENADRYL) 25 MG tablet Take 25 mg by mouth See admin instructions. Take one tablet (25 mg) by mouth daily at bedtime, and take one tablet (25 mg) by mouth on Monday, Wednesday, Friday mornings before dialysis  . doxercalciferol (HECTOROL) 4 MCG/2ML injection Inject 0.5 mLs (1 mcg total) into the vein every Monday, Wednesday, and Friday with hemodialysis.  . DULoxetine (CYMBALTA) 30 MG capsule Take 30 mg by mouth at bedtime.  Marland Kitchen  fluticasone (FLONASE) 50 MCG/ACT nasal spray Place 1 spray into both nostrils daily as needed for allergies or rhinitis (congestion).   . gabapentin (NEURONTIN) 100 MG capsule Take 100 mg by mouth 2 (two) times daily.  . insulin aspart (NOVOLOG FLEXPEN) 100 UNIT/ML FlexPen Inject 2-10 Units into the skin 2 (two) times daily with a meal. Per CBG  . lanthanum  (FOSRENOL) 1000 MG chewable tablet Chew 2,000-4,000 mg by mouth See admin instructions. Chew 4 tablets (4000 mg) by mouth with each meal and 2 tablets (2000 mg) with snacks  . multivitamin (RENA-VIT) TABS tablet Take 1 tablet by mouth daily.  Marland Kitchen omeprazole (PRILOSEC) 40 MG capsule Take 1 capsule (40 mg total) by mouth daily.  [name] test strip 1 each by Other route 3 (three) times daily.   . OXYGEN Inhale 2 L into the lungs continuous.  . prednisoLONE acetate (PRED FORTE) 1 % ophthalmic suspension Place 1 drop into the right eye daily. For eye pain  . [DISCONTINUED] omeprazole (PRILOSEC) 40 MG capsule Take 1 capsule (40 mg total) by mouth daily. (Patient taking differently: Take 40 mg by mouth 2 (two) times daily. )  . LORazepam (ATIVAN) 0.5 MG tablet Take a 1/2 tablet 30 minutes before MRI   No facility-administered encounter medications on file as of 06/11/2020.   Allergies  Allergen Reactions  . Tape Itching and Rash    47M Transpore adhesive tape. Medical tape pulls off the skin!! PAPER TAPE ONLY, PLEASE  . Latex Hives  . Oxycodone Other (See Comments)    Hallucinations   . Tramadol Other (See Comments)    Hallucinations with a full tablet  . Aspirin Other (See Comments)    Trouble with stomach bleeding  . Morphine And Related Other (See Comments)    Hallucinations   . Vicodin [Hydrocodone-Acetaminophen] Other (See Comments)    Hallucinations    Patient Active Problem List   Diagnosis Date Noted  . Benign neoplasm of colon   . GI bleed 05/23/2020  . Upper GI bleed 05/22/2020  . Angiodysplasia of intestine with hemorrhage 01/24/2020  . Acute blood loss anemia 12/16/2019  . Hypotension 12/16/2019  . 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[place]) 11/17/2019  . Infection of amputation stump, left lower extremity ([place]) 10/19/2019  . Peripheral artery disease ([place]) 09/30/2019  . Gangrene of foot ([facility]) 08/30/2019  . Other fatigue 02/28/2019  . Finger pain, left  02/03/2019  . Trigger ring finger of left hand 02/03/2019  . Osteomyelitis, unspecified ([place]) 11/16/2018  . Diabetic retinopathy associated with type 2 diabetes mellitus ([place]) 11/15/2018  . Rheumatoid factor positive 11/15/2018  . Anemia in ESRD (end-stage renal disease) ([place]) 05/06/2018  . Asthma 05/06/2018  . Hx of adenomatous colonic polyps 04/07/2018  . Status post carpal tunnel release 12/10/2017  . Carpal tunnel syndrome, left upper limb 11/10/2017  . Carpal tunnel syndrome, right upper limb 11/10/2017  . Bilateral hand numbness 09/28/2017  . Dependence on renal dialysis ([place]) 09/11/2017  . Symptomatic anemia 07/06/2017  . Hypertension 07/06/2017  . Chronic combined systolic and diastolic CHF (congestive heart failure) ([place]) 07/06/2017  . Complicated migraine 16/04/9603  . ESRD on dialysis ([place]) 06/04/2017  . Blindness of both eyes 06/04/2017  . Glaucoma 06/04/2017  . Essential hypertension 10/27/2016  . GERD (gastroesophageal reflux disease) 10/27/2016  . Hypercalcemia 05/28/2015  . Abnormal stress test 03/15/2015  . Poor venous access 02/08/2015  . Diarrhea, unspecified 11/02/2014  . Fever, unspecified 11/02/2014  . Pain, unspecified 11/02/2014  .  Pruritus, unspecified 11/02/2014  . Shortness of breath 07/28/2014  . Acquired absence of eye 03/31/2014  . Coagulation defect, unspecified ([place]) 03/22/2014  . Dysuria 02/14/2014  . Encounter for immunization 01/03/2014  . Iron deficiency anemia, unspecified 10/18/2013  . Unspecified protein-calorie malnutrition ([place]) 09/23/2013  . Complication of vascular dialysis catheter 06/21/2013  . Fibromyalgia 06/21/2013  . Personal history of breast cancer 06/21/2013  . Personal history of other diseases of the musculoskeletal system and connective tissue 06/21/2013  . Type 2 diabetes mellitus with diabetic polyneuropathy ([place]) 06/21/2013  . Secondary hyperparathyroidism of renal origin ([place]) 06/20/2013  . Unspecified complication  of cardiac and vascular prosthetic device, implant and graft, subsequent encounter 06/20/2013  . Type II diabetes mellitus with renal manifestations ([place]) 06/15/2013  . Neuropathy 12/31/2011  . Breast cancer of upper-inner quadrant of left female breast ([place]) 06/27/2011   Social History   Socioeconomic History  . Marital status: Single    Spouse name: Not on file  . Number of children: 2  . Years of education: 39  . Highest education level: Not on file  Occupational History  . Occupation: Diabled  Tobacco Use  . Smoking status: Never Smoker  . Smokeless tobacco: Never Used  Vaping Use  . Vaping Use: Never used  Substance and Sexual Activity  . Alcohol use: No  . Drug use: No  . Sexual activity: Not Currently    Comment: Hysterectomy  Other Topics Concern  . Not on file  Social History Narrative   She reports she is single with one son and one daughter. 3 caffeinated beverages a day. She dialyzes Monday Wednesday Friday at the [facility].      Her sister is her primary caretaker and spends the nights with her   Social Determinants of Health   Financial Resource Strain:   . Difficulty of Paying Living Expenses: Not on file  Food Insecurity:   . Worried About Charity fundraiser in the Last Year: Not on file  . Ran Out of Food in the Last Year: Not on file  Transportation Needs: No Transportation Needs  . Lack of Transportation (Medical): No  . Lack of Transportation (Non-Medical): No  Physical Activity:   . Days of Exercise per Week: Not on file  . Minutes of Exercise per Session: Not on file  Stress:   . Feeling of Stress : Not on file  Social Connections:   . Frequency of Communication with Friends and Family: Not on file  . Frequency of Social Gatherings with Friends and Family: Not on file  . Attends Religious Services: Not on file  . Active Member of Clubs or Organizations: Not on file  . Attends Archivist Meetings: Not on file  .  Marital Status: Not on file  Intimate Partner Violence:   . Fear of Current or Ex-Partner: Not on file  . Emotionally Abused: Not on file  . Physically Abused: Not on file  . Sexually Abused: Not on file    Ms. Bougher's family history includes Diabetes in her brother, mother, and sister; Heart attack in her brother; Hyperlipidemia in her mother; Hypertension in her brother, father, and mother; Kidney disease in her brother.      Objective:    Vitals:   06/11/20 1325  BP: (!) 92/40  Pulse: (!) 52    Physical Exam Well-developed chronically ill-appearing older African-American female in a wheelchair, accompanied by her sister in no acute distress.  Height, Weight, 159  BMI 26.6  HEENT; nontraumatic normocephalic, EOMI, PER R LA, sclera anicteric.,  Wearing dark glasses Oropharynx; not examined Neck; supple, no JVD Cardiovascular; regular rate and rhythm with S1-S2, no murmur rub or gallop Pulmonary; Clear bilaterally Abdomen; soft, nontender, nondistended, no palpable mass or hepatosplenomegaly, bowel sounds are active Rectal; not done today Skin; benign exam, no jaundice rash or appreciable lesions Extremities; status post bilateral AKA amputations Neuro/Psych; alert and oriented x4, grossly nonfocal mood and affect appropriate       Assessment & Plan:   #60 69 year old African-American female with multiple comorbidities with history of recurrent anemia and recent hospitalization for same associated with melena. She underwent CT angiography, colonoscopy and EGD with enteroscopy during hospitalization 11/2 through 05/25/2020. Found to have both gastric and proximal jejunal AVMs which were treated with APC. Hemoglobin 5.1 on admission, 9.8 on discharge and follow-up a few days ago hemoglobin 7.7 and stool documented heme negative.  #2 history of adenomatous colon polyps/multiple 2019.  Follow-up colonoscopy 05/23/2020 no adenomatous polyps.-Indicated for 5-year interval  follow-up #3 indeterminate 3.5 cm right lobe liver subcapsular lesion on recent CT angio MRI recommended  #4 end-stage renal disease on dialysis 5.  Peripheral arterial disease status post bilateral AKA's 6.  Blindness 7.  Adult onset diabetes mellitus 8.  Congestive heart failure  Plan; continue omeprazole 40 mg p.o. every morning Patient will need continued serial hemoglobins which are generally obtained at least weekly with dialysis and transfuse for hemoglobin less than 7 She has not had a significant drop in hemoglobin or evidence of ongoing bleeding since discharge so will not pursue capsule endoscopy.  She did have capsule endoscopy done in May 2021.  Will obtain MRI of the liver to further evaluate the indeterminate lesion noted on recent CT. Further recommendations pending findings of above. Patient sister requested to be called with results etc. her phone number is [phone]/Cathy  Elijan Kane Genia Harold PA-C 06/11/2020   Cc: Prince Solian, MD

## 2020-06-12 DIAGNOSIS — D631 Anemia in chronic kidney disease: Secondary | ICD-10-CM | POA: Diagnosis not present

## 2020-06-12 DIAGNOSIS — D509 Iron deficiency anemia, unspecified: Secondary | ICD-10-CM | POA: Diagnosis not present

## 2020-06-12 DIAGNOSIS — N186 End stage renal disease: Secondary | ICD-10-CM | POA: Diagnosis not present

## 2020-06-12 DIAGNOSIS — Z992 Dependence on renal dialysis: Secondary | ICD-10-CM | POA: Diagnosis not present

## 2020-06-12 DIAGNOSIS — N2581 Secondary hyperparathyroidism of renal origin: Secondary | ICD-10-CM | POA: Diagnosis not present

## 2020-06-15 DIAGNOSIS — N2581 Secondary hyperparathyroidism of renal origin: Secondary | ICD-10-CM | POA: Diagnosis not present

## 2020-06-15 DIAGNOSIS — D631 Anemia in chronic kidney disease: Secondary | ICD-10-CM | POA: Diagnosis not present

## 2020-06-15 DIAGNOSIS — Z992 Dependence on renal dialysis: Secondary | ICD-10-CM | POA: Diagnosis not present

## 2020-06-15 DIAGNOSIS — D509 Iron deficiency anemia, unspecified: Secondary | ICD-10-CM | POA: Diagnosis not present

## 2020-06-15 DIAGNOSIS — N186 End stage renal disease: Secondary | ICD-10-CM | POA: Diagnosis not present

## 2020-06-18 DIAGNOSIS — N2581 Secondary hyperparathyroidism of renal origin: Secondary | ICD-10-CM | POA: Diagnosis not present

## 2020-06-18 DIAGNOSIS — D509 Iron deficiency anemia, unspecified: Secondary | ICD-10-CM | POA: Diagnosis not present

## 2020-06-18 DIAGNOSIS — Z992 Dependence on renal dialysis: Secondary | ICD-10-CM | POA: Diagnosis not present

## 2020-06-18 DIAGNOSIS — N186 End stage renal disease: Secondary | ICD-10-CM | POA: Diagnosis not present

## 2020-06-18 DIAGNOSIS — D631 Anemia in chronic kidney disease: Secondary | ICD-10-CM | POA: Diagnosis not present

## 2020-06-19 DIAGNOSIS — K769 Liver disease, unspecified: Secondary | ICD-10-CM | POA: Diagnosis not present

## 2020-06-19 DIAGNOSIS — Z89512 Acquired absence of left leg below knee: Secondary | ICD-10-CM | POA: Diagnosis not present

## 2020-06-19 DIAGNOSIS — D62 Acute posthemorrhagic anemia: Secondary | ICD-10-CM | POA: Diagnosis not present

## 2020-06-19 DIAGNOSIS — I739 Peripheral vascular disease, unspecified: Secondary | ICD-10-CM | POA: Diagnosis not present

## 2020-06-19 DIAGNOSIS — R578 Other shock: Secondary | ICD-10-CM | POA: Diagnosis not present

## 2020-06-19 DIAGNOSIS — Z89611 Acquired absence of right leg above knee: Secondary | ICD-10-CM | POA: Diagnosis not present

## 2020-06-19 DIAGNOSIS — N186 End stage renal disease: Secondary | ICD-10-CM | POA: Diagnosis not present

## 2020-06-19 DIAGNOSIS — K922 Gastrointestinal hemorrhage, unspecified: Secondary | ICD-10-CM | POA: Diagnosis not present

## 2020-06-19 DIAGNOSIS — E1129 Type 2 diabetes mellitus with other diabetic kidney complication: Secondary | ICD-10-CM | POA: Diagnosis not present

## 2020-06-19 DIAGNOSIS — I509 Heart failure, unspecified: Secondary | ICD-10-CM | POA: Diagnosis not present

## 2020-06-20 DIAGNOSIS — E1129 Type 2 diabetes mellitus with other diabetic kidney complication: Secondary | ICD-10-CM | POA: Diagnosis not present

## 2020-06-20 DIAGNOSIS — E1122 Type 2 diabetes mellitus with diabetic chronic kidney disease: Secondary | ICD-10-CM | POA: Diagnosis not present

## 2020-06-20 DIAGNOSIS — Z515 Encounter for palliative care: Secondary | ICD-10-CM | POA: Diagnosis not present

## 2020-06-20 DIAGNOSIS — D631 Anemia in chronic kidney disease: Secondary | ICD-10-CM | POA: Diagnosis not present

## 2020-06-20 DIAGNOSIS — Z89511 Acquired absence of right leg below knee: Secondary | ICD-10-CM | POA: Diagnosis not present

## 2020-06-20 DIAGNOSIS — Z853 Personal history of malignant neoplasm of breast: Secondary | ICD-10-CM | POA: Diagnosis not present

## 2020-06-20 DIAGNOSIS — Z23 Encounter for immunization: Secondary | ICD-10-CM | POA: Diagnosis not present

## 2020-06-20 DIAGNOSIS — D649 Anemia, unspecified: Secondary | ICD-10-CM | POA: Diagnosis not present

## 2020-06-20 DIAGNOSIS — Z89512 Acquired absence of left leg below knee: Secondary | ICD-10-CM | POA: Diagnosis not present

## 2020-06-20 DIAGNOSIS — I110 Hypertensive heart disease with heart failure: Secondary | ICD-10-CM | POA: Diagnosis not present

## 2020-06-20 DIAGNOSIS — Z992 Dependence on renal dialysis: Secondary | ICD-10-CM | POA: Diagnosis not present

## 2020-06-20 DIAGNOSIS — K219 Gastro-esophageal reflux disease without esophagitis: Secondary | ICD-10-CM | POA: Diagnosis not present

## 2020-06-20 DIAGNOSIS — N186 End stage renal disease: Secondary | ICD-10-CM | POA: Diagnosis not present

## 2020-06-20 DIAGNOSIS — F419 Anxiety disorder, unspecified: Secondary | ICD-10-CM | POA: Diagnosis not present

## 2020-06-20 DIAGNOSIS — N2581 Secondary hyperparathyroidism of renal origin: Secondary | ICD-10-CM | POA: Diagnosis not present

## 2020-06-21 ENCOUNTER — Ambulatory Visit (HOSPITAL_COMMUNITY)
Admission: RE | Admit: 2020-06-21 | Discharge: 2020-06-21 | Disposition: A | Discharge status: Home / Self Care | Source: Ambulatory Visit | Attending: Physician Assistant | Admitting: Physician Assistant

## 2020-06-21 DIAGNOSIS — K219 Gastro-esophageal reflux disease without esophagitis: Secondary | ICD-10-CM | POA: Diagnosis not present

## 2020-06-21 DIAGNOSIS — D649 Anemia, unspecified: Secondary | ICD-10-CM | POA: Diagnosis not present

## 2020-06-21 DIAGNOSIS — I110 Hypertensive heart disease with heart failure: Secondary | ICD-10-CM | POA: Diagnosis not present

## 2020-06-21 DIAGNOSIS — N186 End stage renal disease: Secondary | ICD-10-CM | POA: Diagnosis not present

## 2020-06-21 DIAGNOSIS — E1122 Type 2 diabetes mellitus with diabetic chronic kidney disease: Secondary | ICD-10-CM | POA: Diagnosis not present

## 2020-06-21 DIAGNOSIS — F419 Anxiety disorder, unspecified: Secondary | ICD-10-CM | POA: Diagnosis not present

## 2020-06-21 NOTE — Progress Notes (Signed)
Patient was a no call no chow for her MRI liver scheduled at 9am today. Order will be placed back into ancillary orders for Rescheduling.

## 2020-06-22 DIAGNOSIS — Z23 Encounter for immunization: Secondary | ICD-10-CM | POA: Diagnosis not present

## 2020-06-22 DIAGNOSIS — Z992 Dependence on renal dialysis: Secondary | ICD-10-CM | POA: Diagnosis not present

## 2020-06-22 DIAGNOSIS — N186 End stage renal disease: Secondary | ICD-10-CM | POA: Diagnosis not present

## 2020-06-22 DIAGNOSIS — N2581 Secondary hyperparathyroidism of renal origin: Secondary | ICD-10-CM | POA: Diagnosis not present

## 2020-06-22 DIAGNOSIS — D631 Anemia in chronic kidney disease: Secondary | ICD-10-CM | POA: Diagnosis not present

## 2020-06-25 DIAGNOSIS — Z992 Dependence on renal dialysis: Secondary | ICD-10-CM | POA: Diagnosis not present

## 2020-06-25 DIAGNOSIS — N2581 Secondary hyperparathyroidism of renal origin: Secondary | ICD-10-CM | POA: Diagnosis not present

## 2020-06-25 DIAGNOSIS — Z23 Encounter for immunization: Secondary | ICD-10-CM | POA: Diagnosis not present

## 2020-06-25 DIAGNOSIS — N186 End stage renal disease: Secondary | ICD-10-CM | POA: Diagnosis not present

## 2020-06-25 DIAGNOSIS — D631 Anemia in chronic kidney disease: Secondary | ICD-10-CM | POA: Diagnosis not present

## 2020-06-26 DIAGNOSIS — D649 Anemia, unspecified: Secondary | ICD-10-CM | POA: Diagnosis not present

## 2020-06-26 DIAGNOSIS — I110 Hypertensive heart disease with heart failure: Secondary | ICD-10-CM | POA: Diagnosis not present

## 2020-06-26 DIAGNOSIS — E1122 Type 2 diabetes mellitus with diabetic chronic kidney disease: Secondary | ICD-10-CM | POA: Diagnosis not present

## 2020-06-26 DIAGNOSIS — F419 Anxiety disorder, unspecified: Secondary | ICD-10-CM | POA: Diagnosis not present

## 2020-06-26 DIAGNOSIS — K219 Gastro-esophageal reflux disease without esophagitis: Secondary | ICD-10-CM | POA: Diagnosis not present

## 2020-06-26 DIAGNOSIS — N186 End stage renal disease: Secondary | ICD-10-CM | POA: Diagnosis not present

## 2020-06-27 DIAGNOSIS — I110 Hypertensive heart disease with heart failure: Secondary | ICD-10-CM | POA: Diagnosis not present

## 2020-06-27 DIAGNOSIS — E1122 Type 2 diabetes mellitus with diabetic chronic kidney disease: Secondary | ICD-10-CM | POA: Diagnosis not present

## 2020-06-27 DIAGNOSIS — N2581 Secondary hyperparathyroidism of renal origin: Secondary | ICD-10-CM | POA: Diagnosis not present

## 2020-06-27 DIAGNOSIS — D649 Anemia, unspecified: Secondary | ICD-10-CM | POA: Diagnosis not present

## 2020-06-27 DIAGNOSIS — N186 End stage renal disease: Secondary | ICD-10-CM | POA: Diagnosis not present

## 2020-06-27 DIAGNOSIS — D631 Anemia in chronic kidney disease: Secondary | ICD-10-CM | POA: Diagnosis not present

## 2020-06-27 DIAGNOSIS — Z992 Dependence on renal dialysis: Secondary | ICD-10-CM | POA: Diagnosis not present

## 2020-06-27 DIAGNOSIS — Z23 Encounter for immunization: Secondary | ICD-10-CM | POA: Diagnosis not present

## 2020-06-27 DIAGNOSIS — F419 Anxiety disorder, unspecified: Secondary | ICD-10-CM | POA: Diagnosis not present

## 2020-06-27 DIAGNOSIS — K219 Gastro-esophageal reflux disease without esophagitis: Secondary | ICD-10-CM | POA: Diagnosis not present

## 2020-06-29 DIAGNOSIS — N186 End stage renal disease: Secondary | ICD-10-CM | POA: Diagnosis not present

## 2020-06-29 DIAGNOSIS — D631 Anemia in chronic kidney disease: Secondary | ICD-10-CM | POA: Diagnosis not present

## 2020-06-29 DIAGNOSIS — Z992 Dependence on renal dialysis: Secondary | ICD-10-CM | POA: Diagnosis not present

## 2020-06-29 DIAGNOSIS — Z23 Encounter for immunization: Secondary | ICD-10-CM | POA: Diagnosis not present

## 2020-06-29 DIAGNOSIS — N2581 Secondary hyperparathyroidism of renal origin: Secondary | ICD-10-CM | POA: Diagnosis not present

## 2020-07-02 DIAGNOSIS — N2581 Secondary hyperparathyroidism of renal origin: Secondary | ICD-10-CM | POA: Diagnosis not present

## 2020-07-02 DIAGNOSIS — N186 End stage renal disease: Secondary | ICD-10-CM | POA: Diagnosis not present

## 2020-07-02 DIAGNOSIS — D631 Anemia in chronic kidney disease: Secondary | ICD-10-CM | POA: Diagnosis not present

## 2020-07-02 DIAGNOSIS — Z992 Dependence on renal dialysis: Secondary | ICD-10-CM | POA: Diagnosis not present

## 2020-07-02 DIAGNOSIS — Z23 Encounter for immunization: Secondary | ICD-10-CM | POA: Diagnosis not present

## 2020-07-03 DIAGNOSIS — K219 Gastro-esophageal reflux disease without esophagitis: Secondary | ICD-10-CM | POA: Diagnosis not present

## 2020-07-03 DIAGNOSIS — I110 Hypertensive heart disease with heart failure: Secondary | ICD-10-CM | POA: Diagnosis not present

## 2020-07-03 DIAGNOSIS — E1122 Type 2 diabetes mellitus with diabetic chronic kidney disease: Secondary | ICD-10-CM | POA: Diagnosis not present

## 2020-07-03 DIAGNOSIS — F419 Anxiety disorder, unspecified: Secondary | ICD-10-CM | POA: Diagnosis not present

## 2020-07-03 DIAGNOSIS — D649 Anemia, unspecified: Secondary | ICD-10-CM | POA: Diagnosis not present

## 2020-07-03 DIAGNOSIS — N186 End stage renal disease: Secondary | ICD-10-CM | POA: Diagnosis not present

## 2020-07-04 DIAGNOSIS — N2581 Secondary hyperparathyroidism of renal origin: Secondary | ICD-10-CM | POA: Diagnosis not present

## 2020-07-04 DIAGNOSIS — D631 Anemia in chronic kidney disease: Secondary | ICD-10-CM | POA: Diagnosis not present

## 2020-07-04 DIAGNOSIS — Z992 Dependence on renal dialysis: Secondary | ICD-10-CM | POA: Diagnosis not present

## 2020-07-04 DIAGNOSIS — Z23 Encounter for immunization: Secondary | ICD-10-CM | POA: Diagnosis not present

## 2020-07-04 DIAGNOSIS — N186 End stage renal disease: Secondary | ICD-10-CM | POA: Diagnosis not present

## 2020-07-05 ENCOUNTER — Other Ambulatory Visit: Payer: Self-pay | Admitting: *Deleted

## 2020-07-05 NOTE — Patient Outreach (Signed)
[facility]) Care Management  07/05/2020  [name] 1951-04-18 [id number]  Unsuccessful outreach attempt made to [name]. RN Health Coach left HIPAA compliant voicemail message along with her contact information.  Plan: RN Health Coach will call patient within the month of January.  Emelia Loron RN, BSN [place] [phone] [email or web address]

## 2020-07-06 DIAGNOSIS — N2581 Secondary hyperparathyroidism of renal origin: Secondary | ICD-10-CM | POA: Diagnosis not present

## 2020-07-06 DIAGNOSIS — Z992 Dependence on renal dialysis: Secondary | ICD-10-CM | POA: Diagnosis not present

## 2020-07-06 DIAGNOSIS — D631 Anemia in chronic kidney disease: Secondary | ICD-10-CM | POA: Diagnosis not present

## 2020-07-06 DIAGNOSIS — N186 End stage renal disease: Secondary | ICD-10-CM | POA: Diagnosis not present

## 2020-07-06 DIAGNOSIS — Z23 Encounter for immunization: Secondary | ICD-10-CM | POA: Diagnosis not present

## 2020-07-08 DIAGNOSIS — E1122 Type 2 diabetes mellitus with diabetic chronic kidney disease: Secondary | ICD-10-CM | POA: Diagnosis not present

## 2020-07-08 DIAGNOSIS — F419 Anxiety disorder, unspecified: Secondary | ICD-10-CM | POA: Diagnosis not present

## 2020-07-08 DIAGNOSIS — K219 Gastro-esophageal reflux disease without esophagitis: Secondary | ICD-10-CM | POA: Diagnosis not present

## 2020-07-08 DIAGNOSIS — N186 End stage renal disease: Secondary | ICD-10-CM | POA: Diagnosis not present

## 2020-07-08 DIAGNOSIS — D649 Anemia, unspecified: Secondary | ICD-10-CM | POA: Diagnosis not present

## 2020-07-08 DIAGNOSIS — I110 Hypertensive heart disease with heart failure: Secondary | ICD-10-CM | POA: Diagnosis not present

## 2020-07-09 DIAGNOSIS — N2581 Secondary hyperparathyroidism of renal origin: Secondary | ICD-10-CM | POA: Diagnosis not present

## 2020-07-09 DIAGNOSIS — Z992 Dependence on renal dialysis: Secondary | ICD-10-CM | POA: Diagnosis not present

## 2020-07-09 DIAGNOSIS — N186 End stage renal disease: Secondary | ICD-10-CM | POA: Diagnosis not present

## 2020-07-09 DIAGNOSIS — D631 Anemia in chronic kidney disease: Secondary | ICD-10-CM | POA: Diagnosis not present

## 2020-07-09 DIAGNOSIS — Z23 Encounter for immunization: Secondary | ICD-10-CM | POA: Diagnosis not present

## 2020-07-11 DIAGNOSIS — Z992 Dependence on renal dialysis: Secondary | ICD-10-CM | POA: Diagnosis not present

## 2020-07-11 DIAGNOSIS — Z23 Encounter for immunization: Secondary | ICD-10-CM | POA: Diagnosis not present

## 2020-07-11 DIAGNOSIS — N2581 Secondary hyperparathyroidism of renal origin: Secondary | ICD-10-CM | POA: Diagnosis not present

## 2020-07-11 DIAGNOSIS — D631 Anemia in chronic kidney disease: Secondary | ICD-10-CM | POA: Diagnosis not present

## 2020-07-11 DIAGNOSIS — N186 End stage renal disease: Secondary | ICD-10-CM | POA: Diagnosis not present

## 2020-07-12 DIAGNOSIS — D649 Anemia, unspecified: Secondary | ICD-10-CM | POA: Diagnosis not present

## 2020-07-12 DIAGNOSIS — F419 Anxiety disorder, unspecified: Secondary | ICD-10-CM | POA: Diagnosis not present

## 2020-07-12 DIAGNOSIS — N186 End stage renal disease: Secondary | ICD-10-CM | POA: Diagnosis not present

## 2020-07-12 DIAGNOSIS — E1122 Type 2 diabetes mellitus with diabetic chronic kidney disease: Secondary | ICD-10-CM | POA: Diagnosis not present

## 2020-07-12 DIAGNOSIS — K219 Gastro-esophageal reflux disease without esophagitis: Secondary | ICD-10-CM | POA: Diagnosis not present

## 2020-07-12 DIAGNOSIS — I110 Hypertensive heart disease with heart failure: Secondary | ICD-10-CM | POA: Diagnosis not present

## 2020-07-13 DIAGNOSIS — N2581 Secondary hyperparathyroidism of renal origin: Secondary | ICD-10-CM | POA: Diagnosis not present

## 2020-07-13 DIAGNOSIS — Z992 Dependence on renal dialysis: Secondary | ICD-10-CM | POA: Diagnosis not present

## 2020-07-13 DIAGNOSIS — Z23 Encounter for immunization: Secondary | ICD-10-CM | POA: Diagnosis not present

## 2020-07-13 DIAGNOSIS — D631 Anemia in chronic kidney disease: Secondary | ICD-10-CM | POA: Diagnosis not present

## 2020-07-13 DIAGNOSIS — N186 End stage renal disease: Secondary | ICD-10-CM | POA: Diagnosis not present

## 2020-07-16 DIAGNOSIS — Z23 Encounter for immunization: Secondary | ICD-10-CM | POA: Diagnosis not present

## 2020-07-16 DIAGNOSIS — N2581 Secondary hyperparathyroidism of renal origin: Secondary | ICD-10-CM | POA: Diagnosis not present

## 2020-07-16 DIAGNOSIS — D631 Anemia in chronic kidney disease: Secondary | ICD-10-CM | POA: Diagnosis not present

## 2020-07-16 DIAGNOSIS — N186 End stage renal disease: Secondary | ICD-10-CM | POA: Diagnosis not present

## 2020-07-16 DIAGNOSIS — Z992 Dependence on renal dialysis: Secondary | ICD-10-CM | POA: Diagnosis not present

## 2020-07-17 DIAGNOSIS — E1122 Type 2 diabetes mellitus with diabetic chronic kidney disease: Secondary | ICD-10-CM | POA: Diagnosis not present

## 2020-07-17 DIAGNOSIS — I110 Hypertensive heart disease with heart failure: Secondary | ICD-10-CM | POA: Diagnosis not present

## 2020-07-17 DIAGNOSIS — N186 End stage renal disease: Secondary | ICD-10-CM | POA: Diagnosis not present

## 2020-07-17 DIAGNOSIS — K219 Gastro-esophageal reflux disease without esophagitis: Secondary | ICD-10-CM | POA: Diagnosis not present

## 2020-07-17 DIAGNOSIS — F419 Anxiety disorder, unspecified: Secondary | ICD-10-CM | POA: Diagnosis not present

## 2020-07-17 DIAGNOSIS — D649 Anemia, unspecified: Secondary | ICD-10-CM | POA: Diagnosis not present

## 2020-07-18 DIAGNOSIS — D631 Anemia in chronic kidney disease: Secondary | ICD-10-CM | POA: Diagnosis not present

## 2020-07-18 DIAGNOSIS — N2581 Secondary hyperparathyroidism of renal origin: Secondary | ICD-10-CM | POA: Diagnosis not present

## 2020-07-18 DIAGNOSIS — Z23 Encounter for immunization: Secondary | ICD-10-CM | POA: Diagnosis not present

## 2020-07-18 DIAGNOSIS — N186 End stage renal disease: Secondary | ICD-10-CM | POA: Diagnosis not present

## 2020-07-18 DIAGNOSIS — Z992 Dependence on renal dialysis: Secondary | ICD-10-CM | POA: Diagnosis not present

## 2020-07-20 DIAGNOSIS — D631 Anemia in chronic kidney disease: Secondary | ICD-10-CM | POA: Diagnosis not present

## 2020-07-20 DIAGNOSIS — Z23 Encounter for immunization: Secondary | ICD-10-CM | POA: Diagnosis not present

## 2020-07-20 DIAGNOSIS — Z992 Dependence on renal dialysis: Secondary | ICD-10-CM | POA: Diagnosis not present

## 2020-07-20 DIAGNOSIS — N2581 Secondary hyperparathyroidism of renal origin: Secondary | ICD-10-CM | POA: Diagnosis not present

## 2020-07-20 DIAGNOSIS — N186 End stage renal disease: Secondary | ICD-10-CM | POA: Diagnosis not present

## 2020-07-21 DIAGNOSIS — E1122 Type 2 diabetes mellitus with diabetic chronic kidney disease: Secondary | ICD-10-CM | POA: Diagnosis not present

## 2020-07-21 DIAGNOSIS — F419 Anxiety disorder, unspecified: Secondary | ICD-10-CM | POA: Diagnosis not present

## 2020-07-21 DIAGNOSIS — N186 End stage renal disease: Secondary | ICD-10-CM | POA: Diagnosis not present

## 2020-07-21 DIAGNOSIS — Z89512 Acquired absence of left leg below knee: Secondary | ICD-10-CM | POA: Diagnosis not present

## 2020-07-21 DIAGNOSIS — Z853 Personal history of malignant neoplasm of breast: Secondary | ICD-10-CM | POA: Diagnosis not present

## 2020-07-21 DIAGNOSIS — K219 Gastro-esophageal reflux disease without esophagitis: Secondary | ICD-10-CM | POA: Diagnosis not present

## 2020-07-21 DIAGNOSIS — Z89511 Acquired absence of right leg below knee: Secondary | ICD-10-CM | POA: Diagnosis not present

## 2020-07-21 DIAGNOSIS — Z515 Encounter for palliative care: Secondary | ICD-10-CM | POA: Diagnosis not present

## 2020-07-21 DIAGNOSIS — D649 Anemia, unspecified: Secondary | ICD-10-CM | POA: Diagnosis not present

## 2020-07-21 DIAGNOSIS — E1129 Type 2 diabetes mellitus with other diabetic kidney complication: Secondary | ICD-10-CM | POA: Diagnosis not present

## 2020-07-21 DIAGNOSIS — Z992 Dependence on renal dialysis: Secondary | ICD-10-CM | POA: Diagnosis not present

## 2020-07-21 DIAGNOSIS — I110 Hypertensive heart disease with heart failure: Secondary | ICD-10-CM | POA: Diagnosis not present

## 2020-07-23 DIAGNOSIS — N186 End stage renal disease: Secondary | ICD-10-CM | POA: Diagnosis not present

## 2020-07-23 DIAGNOSIS — Z992 Dependence on renal dialysis: Secondary | ICD-10-CM | POA: Diagnosis not present

## 2020-07-23 DIAGNOSIS — N2581 Secondary hyperparathyroidism of renal origin: Secondary | ICD-10-CM | POA: Diagnosis not present

## 2020-07-23 DIAGNOSIS — D631 Anemia in chronic kidney disease: Secondary | ICD-10-CM | POA: Diagnosis not present

## 2020-07-23 DIAGNOSIS — K219 Gastro-esophageal reflux disease without esophagitis: Secondary | ICD-10-CM | POA: Diagnosis not present

## 2020-07-23 DIAGNOSIS — E1122 Type 2 diabetes mellitus with diabetic chronic kidney disease: Secondary | ICD-10-CM | POA: Diagnosis not present

## 2020-07-23 DIAGNOSIS — F419 Anxiety disorder, unspecified: Secondary | ICD-10-CM | POA: Diagnosis not present

## 2020-07-23 DIAGNOSIS — I110 Hypertensive heart disease with heart failure: Secondary | ICD-10-CM | POA: Diagnosis not present

## 2020-07-23 DIAGNOSIS — D509 Iron deficiency anemia, unspecified: Secondary | ICD-10-CM | POA: Diagnosis not present

## 2020-07-23 DIAGNOSIS — D649 Anemia, unspecified: Secondary | ICD-10-CM | POA: Diagnosis not present

## 2020-07-24 DIAGNOSIS — K219 Gastro-esophageal reflux disease without esophagitis: Secondary | ICD-10-CM | POA: Diagnosis not present

## 2020-07-24 DIAGNOSIS — F419 Anxiety disorder, unspecified: Secondary | ICD-10-CM | POA: Diagnosis not present

## 2020-07-24 DIAGNOSIS — E1122 Type 2 diabetes mellitus with diabetic chronic kidney disease: Secondary | ICD-10-CM | POA: Diagnosis not present

## 2020-07-24 DIAGNOSIS — N186 End stage renal disease: Secondary | ICD-10-CM | POA: Diagnosis not present

## 2020-07-24 DIAGNOSIS — I110 Hypertensive heart disease with heart failure: Secondary | ICD-10-CM | POA: Diagnosis not present

## 2020-07-24 DIAGNOSIS — D649 Anemia, unspecified: Secondary | ICD-10-CM | POA: Diagnosis not present

## 2020-07-25 DIAGNOSIS — D631 Anemia in chronic kidney disease: Secondary | ICD-10-CM | POA: Diagnosis not present

## 2020-07-25 DIAGNOSIS — N186 End stage renal disease: Secondary | ICD-10-CM | POA: Diagnosis not present

## 2020-07-25 DIAGNOSIS — Z992 Dependence on renal dialysis: Secondary | ICD-10-CM | POA: Diagnosis not present

## 2020-07-25 DIAGNOSIS — N2581 Secondary hyperparathyroidism of renal origin: Secondary | ICD-10-CM | POA: Diagnosis not present

## 2020-07-25 DIAGNOSIS — D509 Iron deficiency anemia, unspecified: Secondary | ICD-10-CM | POA: Diagnosis not present

## 2020-07-25 DIAGNOSIS — E1142 Type 2 diabetes mellitus with diabetic polyneuropathy: Secondary | ICD-10-CM | POA: Diagnosis not present

## 2020-07-26 DIAGNOSIS — N186 End stage renal disease: Secondary | ICD-10-CM | POA: Diagnosis not present

## 2020-07-26 DIAGNOSIS — D509 Iron deficiency anemia, unspecified: Secondary | ICD-10-CM | POA: Diagnosis not present

## 2020-07-26 DIAGNOSIS — Z992 Dependence on renal dialysis: Secondary | ICD-10-CM | POA: Diagnosis not present

## 2020-07-26 DIAGNOSIS — E1122 Type 2 diabetes mellitus with diabetic chronic kidney disease: Secondary | ICD-10-CM | POA: Diagnosis not present

## 2020-07-26 DIAGNOSIS — N2581 Secondary hyperparathyroidism of renal origin: Secondary | ICD-10-CM | POA: Diagnosis not present

## 2020-07-26 DIAGNOSIS — K219 Gastro-esophageal reflux disease without esophagitis: Secondary | ICD-10-CM | POA: Diagnosis not present

## 2020-07-26 DIAGNOSIS — D649 Anemia, unspecified: Secondary | ICD-10-CM | POA: Diagnosis not present

## 2020-07-26 DIAGNOSIS — F419 Anxiety disorder, unspecified: Secondary | ICD-10-CM | POA: Diagnosis not present

## 2020-07-26 DIAGNOSIS — I110 Hypertensive heart disease with heart failure: Secondary | ICD-10-CM | POA: Diagnosis not present

## 2020-07-26 DIAGNOSIS — D631 Anemia in chronic kidney disease: Secondary | ICD-10-CM | POA: Diagnosis not present

## 2020-07-30 ENCOUNTER — Other Ambulatory Visit: Payer: Self-pay | Admitting: *Deleted

## 2020-07-30 DIAGNOSIS — M65342 Trigger finger, left ring finger: Secondary | ICD-10-CM | POA: Diagnosis not present

## 2020-07-30 DIAGNOSIS — D509 Iron deficiency anemia, unspecified: Secondary | ICD-10-CM | POA: Diagnosis not present

## 2020-07-30 DIAGNOSIS — G5603 Carpal tunnel syndrome, bilateral upper limbs: Secondary | ICD-10-CM | POA: Diagnosis not present

## 2020-07-30 DIAGNOSIS — I110 Hypertensive heart disease with heart failure: Secondary | ICD-10-CM | POA: Diagnosis not present

## 2020-07-30 DIAGNOSIS — M79645 Pain in left finger(s): Secondary | ICD-10-CM | POA: Diagnosis not present

## 2020-07-30 DIAGNOSIS — D649 Anemia, unspecified: Secondary | ICD-10-CM | POA: Diagnosis not present

## 2020-07-30 DIAGNOSIS — G5622 Lesion of ulnar nerve, left upper limb: Secondary | ICD-10-CM | POA: Diagnosis not present

## 2020-07-30 DIAGNOSIS — Z992 Dependence on renal dialysis: Secondary | ICD-10-CM | POA: Diagnosis not present

## 2020-07-30 DIAGNOSIS — E1122 Type 2 diabetes mellitus with diabetic chronic kidney disease: Secondary | ICD-10-CM | POA: Diagnosis not present

## 2020-07-30 DIAGNOSIS — D631 Anemia in chronic kidney disease: Secondary | ICD-10-CM | POA: Diagnosis not present

## 2020-07-30 DIAGNOSIS — K219 Gastro-esophageal reflux disease without esophagitis: Secondary | ICD-10-CM | POA: Diagnosis not present

## 2020-07-30 DIAGNOSIS — F419 Anxiety disorder, unspecified: Secondary | ICD-10-CM | POA: Diagnosis not present

## 2020-07-30 DIAGNOSIS — N186 End stage renal disease: Secondary | ICD-10-CM | POA: Diagnosis not present

## 2020-07-30 DIAGNOSIS — N2581 Secondary hyperparathyroidism of renal origin: Secondary | ICD-10-CM | POA: Diagnosis not present

## 2020-07-30 DIAGNOSIS — L03012 Cellulitis of left finger: Secondary | ICD-10-CM | POA: Diagnosis not present

## 2020-07-30 NOTE — Patient Outreach (Signed)
[facility]) Care Management  07/30/2020  TOY SAMARIN [birth year] [id number]  Unsuccessful outreach attempt made to patient. RN Health Coach unable to leave VM due to phone rang without any answer.   Plan: RN Health Coach will call patient within the month of February  [place], [place] [phone] [email or web address]

## 2020-08-01 DIAGNOSIS — Z992 Dependence on renal dialysis: Secondary | ICD-10-CM | POA: Diagnosis not present

## 2020-08-01 DIAGNOSIS — D631 Anemia in chronic kidney disease: Secondary | ICD-10-CM | POA: Diagnosis not present

## 2020-08-01 DIAGNOSIS — N2581 Secondary hyperparathyroidism of renal origin: Secondary | ICD-10-CM | POA: Diagnosis not present

## 2020-08-01 DIAGNOSIS — N186 End stage renal disease: Secondary | ICD-10-CM | POA: Diagnosis not present

## 2020-08-01 DIAGNOSIS — D509 Iron deficiency anemia, unspecified: Secondary | ICD-10-CM | POA: Diagnosis not present

## 2020-08-02 DIAGNOSIS — K219 Gastro-esophageal reflux disease without esophagitis: Secondary | ICD-10-CM | POA: Diagnosis not present

## 2020-08-02 DIAGNOSIS — E1122 Type 2 diabetes mellitus with diabetic chronic kidney disease: Secondary | ICD-10-CM | POA: Diagnosis not present

## 2020-08-02 DIAGNOSIS — F419 Anxiety disorder, unspecified: Secondary | ICD-10-CM | POA: Diagnosis not present

## 2020-08-02 DIAGNOSIS — I110 Hypertensive heart disease with heart failure: Secondary | ICD-10-CM | POA: Diagnosis not present

## 2020-08-02 DIAGNOSIS — N186 End stage renal disease: Secondary | ICD-10-CM | POA: Diagnosis not present

## 2020-08-02 DIAGNOSIS — D649 Anemia, unspecified: Secondary | ICD-10-CM | POA: Diagnosis not present

## 2020-08-03 DIAGNOSIS — D631 Anemia in chronic kidney disease: Secondary | ICD-10-CM | POA: Diagnosis not present

## 2020-08-03 DIAGNOSIS — N186 End stage renal disease: Secondary | ICD-10-CM | POA: Diagnosis not present

## 2020-08-03 DIAGNOSIS — N2581 Secondary hyperparathyroidism of renal origin: Secondary | ICD-10-CM | POA: Diagnosis not present

## 2020-08-03 DIAGNOSIS — D509 Iron deficiency anemia, unspecified: Secondary | ICD-10-CM | POA: Diagnosis not present

## 2020-08-03 DIAGNOSIS — Z992 Dependence on renal dialysis: Secondary | ICD-10-CM | POA: Diagnosis not present

## 2020-08-07 DIAGNOSIS — I110 Hypertensive heart disease with heart failure: Secondary | ICD-10-CM | POA: Diagnosis not present

## 2020-08-07 DIAGNOSIS — D649 Anemia, unspecified: Secondary | ICD-10-CM | POA: Diagnosis not present

## 2020-08-07 DIAGNOSIS — E1122 Type 2 diabetes mellitus with diabetic chronic kidney disease: Secondary | ICD-10-CM | POA: Diagnosis not present

## 2020-08-07 DIAGNOSIS — K219 Gastro-esophageal reflux disease without esophagitis: Secondary | ICD-10-CM | POA: Diagnosis not present

## 2020-08-07 DIAGNOSIS — F419 Anxiety disorder, unspecified: Secondary | ICD-10-CM | POA: Diagnosis not present

## 2020-08-07 DIAGNOSIS — N186 End stage renal disease: Secondary | ICD-10-CM | POA: Diagnosis not present

## 2020-08-08 DIAGNOSIS — N186 End stage renal disease: Secondary | ICD-10-CM | POA: Diagnosis not present

## 2020-08-08 DIAGNOSIS — N2581 Secondary hyperparathyroidism of renal origin: Secondary | ICD-10-CM | POA: Diagnosis not present

## 2020-08-08 DIAGNOSIS — D509 Iron deficiency anemia, unspecified: Secondary | ICD-10-CM | POA: Diagnosis not present

## 2020-08-08 DIAGNOSIS — Z992 Dependence on renal dialysis: Secondary | ICD-10-CM | POA: Diagnosis not present

## 2020-08-08 DIAGNOSIS — D631 Anemia in chronic kidney disease: Secondary | ICD-10-CM | POA: Diagnosis not present

## 2020-08-09 DIAGNOSIS — I110 Hypertensive heart disease with heart failure: Secondary | ICD-10-CM | POA: Diagnosis not present

## 2020-08-09 DIAGNOSIS — N186 End stage renal disease: Secondary | ICD-10-CM | POA: Diagnosis not present

## 2020-08-09 DIAGNOSIS — K219 Gastro-esophageal reflux disease without esophagitis: Secondary | ICD-10-CM | POA: Diagnosis not present

## 2020-08-09 DIAGNOSIS — E1122 Type 2 diabetes mellitus with diabetic chronic kidney disease: Secondary | ICD-10-CM | POA: Diagnosis not present

## 2020-08-09 DIAGNOSIS — D649 Anemia, unspecified: Secondary | ICD-10-CM | POA: Diagnosis not present

## 2020-08-09 DIAGNOSIS — F419 Anxiety disorder, unspecified: Secondary | ICD-10-CM | POA: Diagnosis not present

## 2020-08-10 DIAGNOSIS — N186 End stage renal disease: Secondary | ICD-10-CM | POA: Diagnosis not present

## 2020-08-10 DIAGNOSIS — D631 Anemia in chronic kidney disease: Secondary | ICD-10-CM | POA: Diagnosis not present

## 2020-08-10 DIAGNOSIS — N2581 Secondary hyperparathyroidism of renal origin: Secondary | ICD-10-CM | POA: Diagnosis not present

## 2020-08-10 DIAGNOSIS — D509 Iron deficiency anemia, unspecified: Secondary | ICD-10-CM | POA: Diagnosis not present

## 2020-08-10 DIAGNOSIS — Z992 Dependence on renal dialysis: Secondary | ICD-10-CM | POA: Diagnosis not present

## 2020-08-13 ENCOUNTER — Other Ambulatory Visit: Payer: Self-pay

## 2020-08-13 ENCOUNTER — Encounter (HOSPITAL_COMMUNITY): Payer: Self-pay | Admitting: Emergency Medicine

## 2020-08-13 ENCOUNTER — Emergency Department (HOSPITAL_COMMUNITY): Payer: Medicare Other

## 2020-08-13 ENCOUNTER — Emergency Department (HOSPITAL_COMMUNITY)
Admission: EM | Admit: 2020-08-13 | Discharge: 2020-08-13 | Disposition: A | Discharge status: Home / Self Care | Payer: Medicare Other | Attending: Emergency Medicine | Admitting: Emergency Medicine

## 2020-08-13 DIAGNOSIS — N186 End stage renal disease: Secondary | ICD-10-CM | POA: Diagnosis not present

## 2020-08-13 DIAGNOSIS — E1142 Type 2 diabetes mellitus with diabetic polyneuropathy: Secondary | ICD-10-CM | POA: Diagnosis not present

## 2020-08-13 DIAGNOSIS — R42 Dizziness and giddiness: Secondary | ICD-10-CM | POA: Diagnosis not present

## 2020-08-13 DIAGNOSIS — R7889 Finding of other specified substances, not normally found in blood: Secondary | ICD-10-CM | POA: Diagnosis not present

## 2020-08-13 DIAGNOSIS — E1140 Type 2 diabetes mellitus with diabetic neuropathy, unspecified: Secondary | ICD-10-CM | POA: Insufficient documentation

## 2020-08-13 DIAGNOSIS — J45909 Unspecified asthma, uncomplicated: Secondary | ICD-10-CM | POA: Diagnosis not present

## 2020-08-13 DIAGNOSIS — I959 Hypotension, unspecified: Secondary | ICD-10-CM | POA: Diagnosis not present

## 2020-08-13 DIAGNOSIS — R160 Hepatomegaly, not elsewhere classified: Secondary | ICD-10-CM | POA: Diagnosis not present

## 2020-08-13 DIAGNOSIS — K922 Gastrointestinal hemorrhage, unspecified: Secondary | ICD-10-CM

## 2020-08-13 DIAGNOSIS — Z91040 Latex allergy status: Secondary | ICD-10-CM | POA: Diagnosis not present

## 2020-08-13 DIAGNOSIS — D631 Anemia in chronic kidney disease: Secondary | ICD-10-CM | POA: Diagnosis not present

## 2020-08-13 DIAGNOSIS — R58 Hemorrhage, not elsewhere classified: Secondary | ICD-10-CM | POA: Diagnosis not present

## 2020-08-13 DIAGNOSIS — I10 Essential (primary) hypertension: Secondary | ICD-10-CM | POA: Diagnosis not present

## 2020-08-13 DIAGNOSIS — K219 Gastro-esophageal reflux disease without esophagitis: Secondary | ICD-10-CM | POA: Diagnosis not present

## 2020-08-13 DIAGNOSIS — I5042 Chronic combined systolic (congestive) and diastolic (congestive) heart failure: Secondary | ICD-10-CM | POA: Insufficient documentation

## 2020-08-13 DIAGNOSIS — N2581 Secondary hyperparathyroidism of renal origin: Secondary | ICD-10-CM | POA: Diagnosis not present

## 2020-08-13 DIAGNOSIS — Z794 Long term (current) use of insulin: Secondary | ICD-10-CM | POA: Diagnosis not present

## 2020-08-13 DIAGNOSIS — F419 Anxiety disorder, unspecified: Secondary | ICD-10-CM | POA: Diagnosis not present

## 2020-08-13 DIAGNOSIS — Z992 Dependence on renal dialysis: Secondary | ICD-10-CM | POA: Insufficient documentation

## 2020-08-13 DIAGNOSIS — I132 Hypertensive heart and chronic kidney disease with heart failure and with stage 5 chronic kidney disease, or end stage renal disease: Secondary | ICD-10-CM | POA: Diagnosis not present

## 2020-08-13 DIAGNOSIS — Z853 Personal history of malignant neoplasm of breast: Secondary | ICD-10-CM | POA: Diagnosis not present

## 2020-08-13 DIAGNOSIS — D649 Anemia, unspecified: Secondary | ICD-10-CM | POA: Diagnosis not present

## 2020-08-13 DIAGNOSIS — E1122 Type 2 diabetes mellitus with diabetic chronic kidney disease: Secondary | ICD-10-CM | POA: Insufficient documentation

## 2020-08-13 DIAGNOSIS — I110 Hypertensive heart disease with heart failure: Secondary | ICD-10-CM | POA: Diagnosis not present

## 2020-08-13 DIAGNOSIS — E11319 Type 2 diabetes mellitus with unspecified diabetic retinopathy without macular edema: Secondary | ICD-10-CM | POA: Insufficient documentation

## 2020-08-13 DIAGNOSIS — D509 Iron deficiency anemia, unspecified: Secondary | ICD-10-CM | POA: Diagnosis not present

## 2020-08-13 DIAGNOSIS — R195 Other fecal abnormalities: Secondary | ICD-10-CM | POA: Diagnosis present

## 2020-08-13 DIAGNOSIS — R0902 Hypoxemia: Secondary | ICD-10-CM | POA: Diagnosis not present

## 2020-08-13 LAB — COMPREHENSIVE METABOLIC PANEL
ALT: 25 U/L (ref 0–44)
AST: 33 U/L (ref 15–41)
Albumin: 3.3 g/dL — ABNORMAL LOW (ref 3.5–5.0)
Alkaline Phosphatase: 121 U/L (ref 38–126)
Anion gap: 21 — ABNORMAL HIGH (ref 5–15)
BUN: 34 mg/dL — ABNORMAL HIGH (ref 8–23)
CO2: 26 mmol/L (ref 22–32)
Calcium: 8.7 mg/dL — ABNORMAL LOW (ref 8.9–10.3)
Chloride: 92 mmol/L — ABNORMAL LOW (ref 98–111)
Creatinine, Ser: 8.35 mg/dL — ABNORMAL HIGH (ref 0.44–1.00)
GFR, Estimated: 5 mL/min — ABNORMAL LOW (ref >60)
Glucose, Bld: 101 mg/dL — ABNORMAL HIGH (ref 70–99)
Potassium: 3.7 mmol/L (ref 3.5–5.1)
Sodium: 139 mmol/L (ref 135–145)
Total Bilirubin: 1.3 mg/dL — ABNORMAL HIGH (ref 0.3–1.2)
Total Protein: 6.9 g/dL (ref 6.5–8.1)

## 2020-08-13 LAB — CBC WITH DIFFERENTIAL/PLATELET
Abs Immature Granulocytes: 0.02 10*3/uL (ref 0.00–0.07)
Basophils Absolute: 0.1 10*3/uL (ref 0.0–0.1)
Basophils Relative: 1 %
Eosinophils Absolute: 0.2 10*3/uL (ref 0.0–0.5)
Eosinophils Relative: 3 %
HCT: 31.7 % — ABNORMAL LOW (ref 36.0–46.0)
Hemoglobin: 9.7 g/dL — ABNORMAL LOW (ref 12.0–15.0)
Immature Granulocytes: 0 %
Lymphocytes Relative: 19 %
Lymphs Abs: 1.1 10*3/uL (ref 0.7–4.0)
MCH: 28.3 pg (ref 26.0–34.0)
MCHC: 30.6 g/dL (ref 30.0–36.0)
MCV: 92.4 fL (ref 80.0–100.0)
Monocytes Absolute: 0.5 10*3/uL (ref 0.1–1.0)
Monocytes Relative: 9 %
Neutro Abs: 3.7 10*3/uL (ref 1.7–7.7)
Neutrophils Relative %: 68 %
Platelets: 204 10*3/uL (ref 150–400)
RBC: 3.43 MIL/uL — ABNORMAL LOW (ref 3.87–5.11)
RDW: 16.2 % — ABNORMAL HIGH (ref 11.5–15.5)
WBC: 5.6 10*3/uL (ref 4.0–10.5)
nRBC: 0 % (ref 0.0–0.2)

## 2020-08-13 LAB — TYPE AND SCREEN
ABO/RH(D): A POS
Antibody Screen: NEGATIVE

## 2020-08-13 LAB — LIPASE, BLOOD: Lipase: 30 U/L (ref 11–51)

## 2020-08-13 LAB — POC OCCULT BLOOD, ED: Fecal Occult Bld: POSITIVE — AB

## 2020-08-13 MED ORDER — PANTOPRAZOLE SODIUM 40 MG IV SOLR
40.0000 mg | Freq: Once | INTRAVENOUS | Status: AC
  Administered 2020-08-13: 40 mg via INTRAVENOUS
  Filled 2020-08-13: qty 40

## 2020-08-13 NOTE — Discharge Instructions (Addendum)
Follow-up with Dr. Carlean Purl within the next few days as discussed.  Return to emergency room if you have any worsening symptoms including worsening abdominal pain, blood in your stool, vomiting blood, dizziness, fatigue or other worsening symptoms.  You do have a lesion on your liver potentially needs further evaluation by your doctor.  You can discuss this with Dr. Arelia Longest or your primary care doctor.

## 2020-08-13 NOTE — ED Notes (Signed)
Patient verbalizes understanding of discharge instructions as well as sister. Opportunity for questioning and answers were provided. Armband removed by staff, pt discharged from ED with sister taking her home.

## 2020-08-13 NOTE — ED Notes (Signed)
Called [facility] 15:19 - Patient in line for transport; ETA "couple hours"

## 2020-08-13 NOTE — ED Notes (Signed)
Pt's sister coming to pick up pt. Pt updated and pleased.

## 2020-08-13 NOTE — ED Provider Notes (Signed)
[facility] EMERGENCY DEPARTMENT Provider Note   CSN: [id number] Arrival date & time: 08/13/20  2703     History Chief Complaint  Patient presents with  . GI Bleeding    [name] is a 70 y.o. female.  Patient is a 70 year old female with a history of end-stage renal disease on dialysis Monday Wednesday Friday, hypertension, diabetes who presents with blood in her stool and low hemoglobin.  She presented to dialysis today and was found to have a low hemoglobin.  She completed about 30 minutes of her dialysis.  She says she has felt a little bit weak and a little lightheaded recently.  She has noticed some dark stools since Friday.  She had an admission in November 2021 for an upper GI bleed.  Endoscopy had showed 6 mm angiodysplastic lesion which was coagulated.  She has had some nausea and some gagging but has not been vomiting any blood.  No significant shortness of breath.  She does have some pain across her upper abdomen which has been going on intermittently since Friday.        Past Medical History:  Diagnosis Date  . Anemia   . Anxiety   . Arthritis    "joints" (06/15/2013)  . Asthma   . 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 Blood transfusion without reported diagnosis   . Breast cancer ([place])    left  . CHF (congestive heart failure) ([place])   . 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 Esophageal ulcer with bleeding   . ESRD (end stage renal disease) ([place])    "suppose to start dialysis today" (06/15/2013)  . Family history of adverse reaction to anesthesia    It took a while for pt sister to wake from anesthesia  . GERD (gastroesophageal reflux disease)   . Glaucoma    blind in both eyes  . Heart murmur    Mild AS, moderate MR, moderate TR 10/30/16 echo  . Hx of adenomatous colonic polyps 04/07/2018  . Hypertension   . Myalgia 12/31/2011  . Neuropathy 12/31/2011  . PAD (peripheral artery disease) (HCC)    nonviable  tissue left lower extremity  . Shortness of breath    "when she doesn't go to dialysis"  . Type II diabetes mellitus ([place])    Type II    Patient Active Problem List   Diagnosis Date Noted  . Benign neoplasm of colon   . GI bleed 05/23/2020  . Upper GI bleed 05/22/2020  . Angiodysplasia of intestine with hemorrhage 01/24/2020  . Acute blood loss anemia 12/16/2019  . Hypotension 12/16/2019  . 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 Infection of amputation stump, left lower extremity ([place]) 10/19/2019  . Peripheral artery disease ([place]) 09/30/2019  . Gangrene of foot ([place]) 08/30/2019  . Other fatigue 02/28/2019  . Finger pain, left 02/03/2019  . Trigger ring finger of left hand 02/03/2019  . Osteomyelitis, unspecified ([place]) 11/16/2018  . Diabetic retinopathy associated with type 2 diabetes mellitus ([place]) 11/15/2018  . Rheumatoid factor positive 11/15/2018  . Anemia in ESRD (end-stage renal disease) ([place]) 05/06/2018  . Asthma 05/06/2018  . Hx of adenomatous colonic polyps 04/07/2018  . Status post carpal tunnel release 12/10/2017  . Carpal tunnel syndrome, left upper limb 11/10/2017  . Carpal tunnel syndrome, right upper limb 11/10/2017  . Bilateral hand numbness 09/28/2017  . Dependence on renal dialysis ([place]) 09/11/2017  . Symptomatic  anemia 07/06/2017  . Hypertension 07/06/2017  . Chronic combined systolic and diastolic CHF (congestive heart failure) ([place]) 07/06/2017  . Complicated migraine 42/70/6237  . ESRD on dialysis ([place]) 06/04/2017  . Blindness of both eyes 06/04/2017  . Glaucoma 06/04/2017  . Essential hypertension 10/27/2016  . GERD (gastroesophageal reflux disease) 10/27/2016  . Hypercalcemia 05/28/2015  . Abnormal stress test 03/15/2015  . Poor venous access 02/08/2015  . Diarrhea, unspecified 11/02/2014  . Fever, unspecified 11/02/2014  . Pain, unspecified 11/02/2014  . Pruritus, unspecified 11/02/2014  . Shortness of breath 07/28/2014   . Acquired absence of eye 03/31/2014  . Coagulation defect, unspecified ([place]) 03/22/2014  . Dysuria 02/14/2014  . Encounter for immunization 01/03/2014  . Iron deficiency anemia, unspecified 10/18/2013  . Unspecified protein-calorie malnutrition ([place]) 09/23/2013  . Complication of vascular dialysis catheter 06/21/2013  . Fibromyalgia 06/21/2013  . Personal history of breast cancer 06/21/2013  . Personal history of other diseases of the musculoskeletal system and connective tissue 06/21/2013  . Type 2 diabetes mellitus with diabetic polyneuropathy ([place]) 06/21/2013  . Secondary hyperparathyroidism of renal origin ([place]) 06/20/2013  . Unspecified complication of cardiac and vascular prosthetic device, implant and graft, subsequent encounter 06/20/2013  . Type II diabetes mellitus with renal manifestations ([place]) 06/15/2013  . Neuropathy 12/31/2011  . Breast cancer of upper-inner quadrant of left female breast ([place]) 06/27/2011    Past Surgical History:  Procedure Laterality Date  . A/V FISTULAGRAM N/A 09/21/2018   Procedure: A/V FISTULAGRAM - Right Upper;  Surgeon: [name], MD;  Location: [place] [facility];  Service: Cardiovascular;  Laterality: N/A;  . ABDOMINAL AORTOGRAM N/A 11/30/2018   Procedure: ABDOMINAL AORTOGRAM;  Surgeon: [name], MD;  Location: [place] [facility];  Service: Cardiovascular;  Laterality: N/A;  . ABDOMINAL AORTOGRAM W/LOWER EXTREMITY Left 07/29/2019   Procedure: ABDOMINAL AORTOGRAM W/LOWER EXTREMITY;  Surgeon: [name], MD;  Location: [place] [facility];  Service: Cardiovascular;  Laterality: Left;  . ABDOMINAL HYSTERECTOMY     partial  . AMPUTATION Left 08/30/2019   Procedure: AMPUTATION BELOW KNEE LEFT;  Surgeon: [name], MD;  Location: [place] OR;  Service: Vascular;  Laterality: Left;  . AMPUTATION Left 10/21/2019   Procedure: Amputation Above Knee;  Surgeon: Rosetta Posner, MD;  Location: [place];  Service: Vascular;  Laterality: Left;  .  AMPUTATION Right 11/17/2019   Procedure: AMPUTATION ABOVE KNEE, right leg;  Surgeon: Rosetta Posner, MD;  Location: [place];  Service: Vascular;  Laterality: Right;  . AV FISTULA PLACEMENT Right 06/06/2013   Procedure: ARTERIOVENOUS (AV) FISTULA CREATION-RIGHT BRACHIAL CEPHALIC;  Surgeon: Conrad Fairfield, MD;  Location: [place];  Service: Vascular;  Laterality: Right;  . BREAST BIOPSY Left   . BREAST LUMPECTOMY Left    "and took out some lymph nodes" (06/15/2013)  . CARDIAC CATHETERIZATION     04/02/15 [facility]): no angiographic CAD, LVEF 40% with global hypokinesis (LHC done for + stress echo, EF 45% 11/28/14)  . CARPAL TUNNEL RELEASE Left 11/26/2017   Procedure: LEFT CARPAL TUNNEL RELEASE;  Surgeon: [name], MD;  Location: [place];  Service: Orthopedics;  Laterality: Left;  . CATARACT EXTRACTION W/ ANTERIOR VITRECTOMY Bilateral   . CESAREAN SECTION  1980  . COLONOSCOPY    . COLONOSCOPY WITH PROPOFOL N/A 05/25/2020   Procedure: COLONOSCOPY WITH PROPOFOL;  Surgeon: Yetta Flock, MD;  Location: [place];  Service: Gastroenterology;  Laterality: N/A;  . ENTEROSCOPY N/A 12/18/2019   Procedure: ENTEROSCOPY;  Surgeon: Irving Copas., MD;  Location: [place];  Service: Gastroenterology;  Laterality: N/A;  . ENTEROSCOPY N/A 05/24/2020   Procedure: ENTEROSCOPY;  Surgeon: Jackquline Denmark, MD;  Location: [facility] ENDOSCOPY;  Service: Endoscopy;  Laterality: N/A;  . ESOPHAGOGASTRODUODENOSCOPY N/A 07/08/2017   Procedure: ESOPHAGOGASTRODUODENOSCOPY (EGD);  Surgeon: [name], MD;  Location: [facility] ENDOSCOPY;  Service: Endoscopy;  Laterality: N/A;  . ESOPHAGOGASTRODUODENOSCOPY (EGD) WITH PROPOFOL N/A 02/07/2018   Procedure: ESOPHAGOGASTRODUODENOSCOPY (EGD) WITH PROPOFOL;  Surgeon: [name], MD;  Location: [facility] ENDOSCOPY;  Service: Gastroenterology;  Laterality: N/A;  APC and clips placed  . EYE SURGERY Bilateral    laser surgery  . GIVENS CAPSULE STUDY N/A 12/19/2019   Procedure: GIVENS  CAPSULE STUDY;  Surgeon: Irving Copas., MD;  Location: [place];  Service: Gastroenterology;  Laterality: N/A;  . HOT HEMOSTASIS N/A 12/18/2019   Procedure: HOT HEMOSTASIS (ARGON PLASMA COAGULATION/BICAP);  Surgeon: Irving Copas., MD;  Location: [place];  Service: Gastroenterology;  Laterality: N/A;  . HOT HEMOSTASIS Right 05/24/2020   Procedure: HOT HEMOSTASIS (ARGON PLASMA COAGULATION/BICAP);  Surgeon: Jackquline Denmark, MD;  Location: [facility] ENDOSCOPY;  Service: Endoscopy;  Laterality: Right;  r colon  . LOWER EXTREMITY ANGIOGRAPHY Bilateral 11/30/2018   Procedure: LOWER EXTREMITY ANGIOGRAPHY;  Surgeon: [name], MD;  Location: [place] [facility];  Service: Cardiovascular;  Laterality: Bilateral;  . PARS PLANA VITRECTOMY Right 05/05/2017   Procedure: PARS PLANA VITRECTOMY WITH 25 GAUGE; PARTIAL REMOVAL OF OIL; INFERIOR PERIPHERAL IRIDECTOMY, REFORM ANTERIOR CHAMBER RIGHT EYE;  Surgeon: [name], MD;  Location: [place];  Service: Ophthalmology;  Laterality: Right;  . PERIPHERAL VASCULAR BALLOON ANGIOPLASTY Right 09/21/2018   Procedure: PERIPHERAL VASCULAR BALLOON ANGIOPLASTY;  Surgeon: [name], MD;  Location: [place] [facility];  Service: Cardiovascular;  Laterality: Right;  AV fistula  . PERIPHERAL VASCULAR BALLOON ANGIOPLASTY Left 11/30/2018   Procedure: PERIPHERAL VASCULAR BALLOON ANGIOPLASTY;  Surgeon: [name], MD;  Location: [place] [facility];  Service: Cardiovascular;  Laterality: Left;  Left Anterior Tibial Artery  . POLYPECTOMY  05/25/2020   Procedure: POLYPECTOMY;  Surgeon: Yetta Flock, MD;  Location: [facility] ENDOSCOPY;  Service: Gastroenterology;;  . REFRACTIVE SURGERY Bilateral   . REMOVAL OF A DIALYSIS CATHETER Right 06/06/2013   Procedure: REMOVAL OF RIGHT MEDIPORT;  Surgeon: Conrad Edgewood, MD;  Location: [place];  Service: Vascular;  Laterality: Right;  . SUBMUCOSAL TATTOO INJECTION  12/18/2019   Procedure: SUBMUCOSAL TATTOO INJECTION;   Surgeon: Irving Copas., MD;  Location: [place];  Service: Gastroenterology;;  . TONSILLECTOMY    . UPPER GASTROINTESTINAL ENDOSCOPY    . WOUND DEBRIDEMENT Left 09/30/2019   Procedure: IRRIGATION AND DEBRIDEMENT WOUND OF LEFT BELOW KNEE AMPUTATION;  Surgeon: [name], MD;  Location: [facility] OR;  Service: Vascular;  Laterality: Left;     OB History   No obstetric history on file.     Family History  Problem Relation Age of Onset  . Diabetes Mother   . Hyperlipidemia Mother   . Hypertension Mother   . Hypertension Father   . Diabetes Sister   . Diabetes Brother   . Hypertension Brother   . Heart attack Brother   . Kidney disease Brother   . Colon cancer Neg Hx   . Colon polyps Neg Hx   . Esophageal cancer Neg Hx   . Gallbladder disease Neg Hx   . Rectal cancer Neg Hx   . Stomach cancer Neg Hx  Social History   Tobacco Use  . Smoking status: Never Smoker  . Smokeless tobacco: Never Used  Vaping Use  . Vaping Use: Never used  Substance Use Topics  . Alcohol use: No  . Drug use: No    Home Medications Prior to Admission medications   Medication Sig Start Date End Date Taking? Authorizing Provider  acetaminophen (TYLENOL) 500 MG tablet Take 500 mg by mouth every 6 (six) hours as needed for headache (pain).    [provider]  albuterol (PROVENTIL) (2.5 MG/3ML) 0.083% nebulizer solution Take 3 mLs (2.5 mg total) by nebulization every 6 (six) hours as needed for wheezing or shortness of breath. 10/30/16   Theodis Blaze, MD  albuterol (VENTOLIN HFA) 108 (90 Base) MCG/ACT inhaler Inhale 2 puffs into the lungs every 6 (six) hours as needed for wheezing or shortness of breath.     [provider]  atropine 1 % ophthalmic solution Place 1 drop into the right eye 2 (two) times daily as needed (dry eyes).  07/04/17   [provider]  BD PEN NEEDLE NANO 2ND GEN 32G X 4 MM MISC in the morning, at noon, and at bedtime.  03/03/19    [provider]  carvedilol (COREG) 25 MG tablet Take 12.5 mg by mouth 2 (two) times daily with a meal.    [provider]  darbepoetin (ARANESP) 200 MCG/0.4ML SOLN injection Inject 0.4 mLs (200 mcg total) into the vein every Wednesday with hemodialysis. 06/22/13   Geradine Girt, DO  diclofenac sodium (VOLTAREN) 1 % GEL Apply 2 g topically 4 (four) times daily as needed (pain).  04/28/19   [provider]  diphenhydrAMINE (BENADRYL) 25 MG tablet Take 25 mg by mouth See admin instructions. Take one tablet (25 mg) by mouth daily at bedtime, and take one tablet (25 mg) by mouth on Monday, Wednesday, Friday mornings before dialysis    [provider]  doxercalciferol (HECTOROL) 4 MCG/2ML injection Inject 0.5 mLs (1 mcg total) into the vein every Monday, Wednesday, and Friday with hemodialysis. 06/18/13   Geradine Girt, DO  DULoxetine (CYMBALTA) 30 MG capsule Take 30 mg by mouth at bedtime. 05/22/20   [provider]  fluticasone (FLONASE) 50 MCG/ACT nasal spray Place 1 spray into both nostrils daily as needed for allergies or rhinitis (congestion).  06/08/20   [provider]  gabapentin (NEURONTIN) 100 MG capsule Take 100 mg by mouth 2 (two) times daily.    [provider]  insulin aspart (NOVOLOG FLEXPEN) 100 UNIT/ML FlexPen Inject 2-10 Units into the skin 2 (two) times daily with a meal. Per CBG    [provider]  lanthanum (FOSRENOL) 1000 MG chewable tablet Chew 2,000-4,000 mg by mouth See admin instructions. Chew 4 tablets (4000 mg) by mouth with each meal and 2 tablets (2000 mg) with snacks    [provider]  LORazepam (ATIVAN) 0.5 MG tablet Take a 1/2 tablet 30 minutes before MRI 06/11/20   Esterwood, Amy S, PA-C  multivitamin (RENA-VIT) TABS tablet Take 1 tablet by mouth daily. 12/20/19   [provider]  omeprazole (PRILOSEC) 40 MG capsule Take 1 capsule (40 mg total) by mouth daily. 06/11/20   Esterwood, Amy  S, PA-C  ONETOUCH VERIO test strip 1 each by Other route 3 (three) times daily.  12/15/17   [provider]  OXYGEN Inhale 2 L into the lungs continuous.    [provider]  prednisoLONE acetate (PRED FORTE) 1 %  ophthalmic suspension Place 1 drop into the right eye daily. For eye pain 04/27/17   [provider]    Allergies    Tape, Latex, Oxycodone, Tramadol, Aspirin, Morphine and related, and Vicodin [hydrocodone-acetaminophen]  Review of Systems   Review of Systems  Constitutional: Positive for fatigue. Negative for chills, diaphoresis and fever.  HENT: Negative for congestion, rhinorrhea and sneezing.   Eyes: Negative.   Respiratory: Negative for cough, chest tightness and shortness of breath.   Cardiovascular: Negative for chest pain and leg swelling.  Gastrointestinal: Positive for abdominal pain and nausea. Negative for blood in stool, diarrhea and vomiting.       Dark stools  Genitourinary: Negative for difficulty urinating, flank pain, frequency and hematuria.  Musculoskeletal: Negative for arthralgias and back pain.  Skin: Negative for rash.  Neurological: Positive for light-headedness. Negative for dizziness, speech difficulty, weakness, numbness and headaches.    Physical Exam Updated Vital Signs BP (!) 105/36   Pulse 85   Temp 97.6 F (36.4 C) (Oral)   Resp 12   LMP  (LMP Unknown)   SpO2 95%   Physical Exam Constitutional:      Appearance: She is well-developed and well-nourished.  HENT:     Head: Normocephalic and atraumatic.  Eyes:     Pupils: Pupils are equal, round, and reactive to light.  Cardiovascular:     Rate and Rhythm: Normal rate and regular rhythm.     Heart sounds: Normal heart sounds.  Pulmonary:     Effort: Pulmonary effort is normal. No respiratory distress.     Breath sounds: Normal breath sounds. No wheezing or rales.  Chest:     Chest wall: No tenderness.  Abdominal:     General: Bowel sounds are normal.      Palpations: Abdomen is soft.     Tenderness: There is abdominal tenderness. There is no guarding or rebound.     Comments: Tenderness across the upper abdomen bilaterally  Genitourinary:    Comments: Brown stool Musculoskeletal:        General: No edema. Normal range of motion.     Cervical back: Normal range of motion and neck supple.  Lymphadenopathy:     Cervical: No cervical adenopathy.  Skin:    General: Skin is warm and dry.     Findings: No rash.  Neurological:     Mental Status: She is alert and oriented to person, place, and time.  Psychiatric:        Mood and Affect: Mood and affect normal.     ED Results / Procedures / Treatments   Labs (all labs ordered are listed, but only abnormal results are displayed) Labs Reviewed  COMPREHENSIVE METABOLIC PANEL - Abnormal; Notable for the following components:      Result Value   Chloride 92 (*)    Glucose, Bld 101 (*)    BUN 34 (*)    Creatinine, Ser 8.35 (*)    Calcium 8.7 (*)    Albumin 3.3 (*)    Total Bilirubin 1.3 (*)    GFR, Estimated 5 (*)    Anion gap 21 (*)    All other components within normal limits  CBC WITH DIFFERENTIAL/PLATELET - Abnormal; Notable for the following components:   RBC 3.43 (*)    Hemoglobin 9.7 (*)    HCT 31.7 (*)    RDW 16.2 (*)    All other components within normal limits  POC OCCULT BLOOD, ED - Abnormal; Notable for the following  components:   Fecal Occult Bld POSITIVE (*)    All other components within normal limits  LIPASE, BLOOD  TYPE AND SCREEN    EKG None  ED ECG REPORT   Date: 08/13/2020  Rate: 91  Rhythm: normal sinus rhythm  QRS Axis: normal  Intervals: normal and QT prolonged  ST/T Wave abnormalities: normal  Conduction Disutrbances:none  Narrative Interpretation:   Old EKG Reviewed: unchanged  I have personally reviewed the EKG tracing and agree with the computerized printout as noted.  Radiology CT Abdomen Pelvis Wo Contrast  Result Date:  08/13/2020 CLINICAL DATA:  Diverticulitis suspected.  Anemia. EXAM: CT ABDOMEN AND PELVIS WITHOUT CONTRAST TECHNIQUE: Multidetector CT imaging of the abdomen and pelvis was performed following the standard protocol without IV contrast. COMPARISON:  CTA abdomen and pelvis 05/22/2020 FINDINGS: Lower chest: Similar appearance of ground-glass opacities in both lung bases. No pleural effusion. Cardiomegaly. Dense mitral annular calcification. Coronary atherosclerosis. Decreased attenuation of the blood pool consistent with known anemia. Hepatobiliary: Persistently enlarged liver measuring 22 cm in craniocaudal dimension. Unchanged 3.5 cm hypodensity in the right hepatic lobe. Unremarkable gallbladder. No biliary dilatation. Pancreas: Mild diffuse pancreatic atrophy. No ductal dilatation or peripancreatic inflammation. Spleen: Unremarkable. Adrenals/Urinary Tract: Unremarkable right adrenal gland. Unchanged mild left adrenal gland thickening without a dominant nodule. Bilateral renal atrophy. Unchanged 2.1 cm hypodensity in the interpolar right kidney, likely a cyst. No hydronephrosis. Collapsed bladder. Stomach/Bowel: The stomach is largely collapsed. Ingested high density material is again noted in the small and large bowel. There is no evidence of bowel obstruction or inflammation. The appendix is unremarkable. Vascular/Lymphatic: Extensive atherosclerotic calcification of the abdominal aorta and its major branch vessels. No aortic aneurysm. No enlarged lymph nodes. Reproductive: Status post hysterectomy. No adnexal masses. Other: No ascites or pneumoperitoneum. No retroperitoneal hematoma. Small fat containing umbilical hernia. Musculoskeletal: No acute osseous abnormality or suspicious osseous lesion. IMPRESSION: 1. No acute abnormality identified in the abdomen or pelvis. 2. Bibasilar ground-glass lung opacities which may reflect edema. 3. Unchanged indeterminate liver lesion which could be further evaluated with  abdominal MRI (preferably as an outpatient when the patient can follow breathing instructions). 4. Aortic Atherosclerosis (ICD10-I70.0). Electronically Signed   By: Logan Bores M.D.   On: 08/13/2020 11:56    Procedures Procedures   Medications Ordered in ED Medications  pantoprazole (PROTONIX) injection 40 mg (40 mg Intravenous Given 08/13/20 1116)    ED Course  I have reviewed the triage vital signs and the nursing notes.  Pertinent labs & imaging results that were available during my care of the patient were reviewed by me and considered in my medical decision making (see chart for details).    MDM Rules/Calculators/A&P                          Patient is a 70 year old female who presents with reported melena and low hemoglobin from dialysis.  Her hemoglobin is 9.7 which seems to be consistent with her baseline values.  Rectal exam did not show any gross blood or melena.  She had a moderate stool here in the emergency room and it was soft and brown.  It was Hemoccult positive but no gross blood.  She does not have any ongoing abdominal pain.  She has some mild tenderness in her epigastrium.  Her labs are nonconcerning.  Her LFTs are within normal limits.  Her lipase is normal.  She had a CT scan which shows no acute abnormalities.  She does have a liver lesion.  I discussed with her and she actually knows about this.  She does not know if she has had any follow-up imaging and I told her to discuss this with her PCP or gastroenterologist.  She is followed by Dr. Carlean Purl.  Given that she does not have any evidence of current melena or active bleeding and her hemoglobin appears to be stable, will discharge home.  I encouraged her to have close follow-up with Dr. Carlean Purl.  Strict return precautions were given. Final Clinical Impression(s) / ED Diagnoses Final diagnoses:  Gastrointestinal hemorrhage, unspecified gastrointestinal hemorrhage type    Rx / DC Orders ED Discharge Orders    None        Malvin Johns, MD 08/13/20 1454

## 2020-08-14 ENCOUNTER — Telehealth: Payer: Self-pay | Admitting: Physician Assistant

## 2020-08-14 NOTE — Telephone Encounter (Signed)
Spoke to sister  Advised Imodium AD for diarrhea, Hydrate w/ fluids   Patient has dialysis in AM  Advised I could see her 250 tomorrow banding slot - please call back tomorrow and see if that will work

## 2020-08-14 NOTE — Telephone Encounter (Signed)
Pt's sister [name] called stating that pt is not able to hold anything down. Pt was at the ED yesterday for GI bleed. First available appt with APP not until 2/10. She is requesting a sooner appt. Pls call her.

## 2020-08-14 NOTE — Telephone Encounter (Signed)
Spoke with the sister. Patient present and I can hear her answering my questions. Patient was seen in the ED yesterday with concerns of a GI bleed.  She calls today to report loose dark smelly stools that occur any time the patient eats anything. No vomiting. She was nauseated yesterday, but not today. Afebrile. She has upper abdominal pains/discomfort  "both sides where she has those liver lesions."  Recommendations?

## 2020-08-15 ENCOUNTER — Ambulatory Visit (INDEPENDENT_AMBULATORY_CARE_PROVIDER_SITE_OTHER): Payer: Medicare Other | Admitting: Internal Medicine

## 2020-08-15 ENCOUNTER — Other Ambulatory Visit: Payer: Self-pay

## 2020-08-15 ENCOUNTER — Telehealth: Payer: Self-pay

## 2020-08-15 VITALS — HR 74

## 2020-08-15 DIAGNOSIS — I700 Atherosclerosis of aorta: Secondary | ICD-10-CM | POA: Diagnosis not present

## 2020-08-15 DIAGNOSIS — I110 Hypertensive heart disease with heart failure: Secondary | ICD-10-CM | POA: Diagnosis not present

## 2020-08-15 DIAGNOSIS — E1122 Type 2 diabetes mellitus with diabetic chronic kidney disease: Secondary | ICD-10-CM | POA: Diagnosis not present

## 2020-08-15 DIAGNOSIS — R197 Diarrhea, unspecified: Secondary | ICD-10-CM | POA: Diagnosis not present

## 2020-08-15 DIAGNOSIS — K219 Gastro-esophageal reflux disease without esophagitis: Secondary | ICD-10-CM | POA: Diagnosis not present

## 2020-08-15 DIAGNOSIS — R1084 Generalized abdominal pain: Secondary | ICD-10-CM

## 2020-08-15 DIAGNOSIS — K769 Liver disease, unspecified: Secondary | ICD-10-CM | POA: Diagnosis not present

## 2020-08-15 DIAGNOSIS — D649 Anemia, unspecified: Secondary | ICD-10-CM

## 2020-08-15 DIAGNOSIS — Z992 Dependence on renal dialysis: Secondary | ICD-10-CM | POA: Diagnosis not present

## 2020-08-15 DIAGNOSIS — D631 Anemia in chronic kidney disease: Secondary | ICD-10-CM | POA: Diagnosis not present

## 2020-08-15 DIAGNOSIS — N186 End stage renal disease: Secondary | ICD-10-CM | POA: Diagnosis not present

## 2020-08-15 DIAGNOSIS — N2581 Secondary hyperparathyroidism of renal origin: Secondary | ICD-10-CM | POA: Diagnosis not present

## 2020-08-15 DIAGNOSIS — F419 Anxiety disorder, unspecified: Secondary | ICD-10-CM | POA: Diagnosis not present

## 2020-08-15 DIAGNOSIS — D509 Iron deficiency anemia, unspecified: Secondary | ICD-10-CM | POA: Diagnosis not present

## 2020-08-15 MED ORDER — ONDANSETRON HCL 4 MG PO TABS: 4.0000 mg | ORAL_TABLET | Freq: Three times a day (TID) | ORAL | 1 refills | Status: DC | PRN

## 2020-08-15 NOTE — Telephone Encounter (Signed)
I have put the patient in the opening if someone can call her.

## 2020-08-15 NOTE — Progress Notes (Signed)
[name] 70 y.o. 09/29/1950 [id number]  Assessment & Plan:   Encounter Diagnoses  Name Primary?  . Generalized abdominal pain Yes  . Diarrhea, unspecified type   . Liver lesion, right lobe   . Aortic atherosclerosis ([place])   . Chronic anemia    She could have a gastroenteritis that was or is infectious that is resolving.  The other possibility is she is having hemodialysis related problems and needs an adjustment to the technique to reduce cramps and problems.  She could very well have ischemia to the gut causing her problems related to her hemodialysis.  She has significant atherosclerosis of the aorta and the branches and certainly is at risk of low blood flow to the gastrointestinal tract during dialysis.  I have prescribed ondansetron for the nausea and recommended she continue Imodium A-D as needed.  I doubt this liver lesion is anything serious.  Opportunities for intervention would be limited given her comorbidities.  We will plan for a triple phase CT scanning of the abdomen in May.  CC: Prince Solian, MD [place]  Subjective:   Chief Complaint: Abdominal pain diarrhea nausea  HPI Patient is here with her sister because of abdominal pain diarrhea and nausea.  Developed problems last Friday about 5 days ago.  I think this started after her dialysis session and she has been having increasing cramps of the abdomen after dialysis.  So generalized cramps nausea and then developed diarrhea over the weekend.  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  I reviewed that note.  She was heme-negative.  Diarrhea really came on more so after that visit.  CT the abdomen and pelvis without contrast was done which demonstrated a stable peripheral liver lesion in the right lobe and hepatomegaly.  3.5 cm hypodensity in the right hepatic lobe.  Previously  seen on CT angio abdomen and pelvis with and without contrast media in November when she was in with bleeding and had a work-up without gastric angiodysplastic lesion ablated.  Hemoglobin was 9 on the 24th it was 7.72 months ago.  She has nausea and dry heaves.  She took some Imodium after I spoke to her sister yesterday and has not moved her bowels since yesterday but did have a bowel movement while here reported a soft and formed. Allergies  Allergen Reactions  . Tape Itching and Rash    57M Transpore adhesive tape. Medical tape pulls off the skin!! PAPER TAPE ONLY, PLEASE  . Latex Hives  . Oxycodone Other (See Comments)    Hallucinations   . Tramadol Other (See Comments)    Hallucinations with a full tablet  . Aspirin Other (See Comments)    Trouble with stomach bleeding  . Morphine And Related Other (See Comments)    Hallucinations   . Vicodin [Hydrocodone-Acetaminophen] Other (See Comments)    Hallucinations    Current Meds  Medication Sig  . acetaminophen (TYLENOL) 500 MG tablet Take 500 mg by mouth every 6 (six) hours as needed for headache (pain).  Marland Kitchen albuterol (PROVENTIL) (2.5 MG/57ML) 0.083% nebulizer solution Take 3 mLs (2.5 mg total) by nebulization every 6 (six) hours as needed for wheezing or shortness of breath.  Marland Kitchen albuterol (VENTOLIN HFA) 108 (90 Base) MCG/ACT inhaler Inhale 2 puffs into the lungs every 6 (six) hours as needed for wheezing or shortness of breath.   Marland Kitchen atropine  1 % ophthalmic solution Place 1 drop into the right eye 2 (two) times daily as needed (dry eyes).   . BD PEN NEEDLE NANO 2ND GEN 32G X 4 MM MISC in the morning, at noon, and at bedtime.   . darbepoetin (ARANESP) 200 MCG/0.4ML SOLN injection Inject 0.4 mLs (200 mcg total) into the vein every Wednesday with hemodialysis.  Marland Kitchen diclofenac sodium (VOLTAREN) 1 % GEL Apply 2 g topically 4 (four) times daily as needed (pain).   Marland Kitchen diphenhydrAMINE (BENADRYL) 25 MG tablet Take 25 mg by mouth See admin instructions.  Take one tablet (25 mg) by mouth daily at bedtime, and take one tablet (25 mg) by mouth on Monday, Wednesday, Friday mornings before dialysis  . doxercalciferol (HECTOROL) 4 MCG/2ML injection Inject 0.5 mLs (1 mcg total) into the vein every Monday, Wednesday, and Friday with hemodialysis.  . fluticasone (FLONASE) 50 MCG/ACT nasal spray Place 1 spray into both nostrils daily as needed for allergies or rhinitis (congestion).   . gabapentin (NEURONTIN) 100 MG capsule Take 100 mg by mouth 2 (two) times daily.  . insulin aspart (NOVOLOG) 100 UNIT/ML FlexPen Inject 2-10 Units into the skin 2 (two) times daily with a meal. Per CBG  . lanthanum (FOSRENOL) 1000 MG chewable tablet Chew 2,000-4,000 mg by mouth See admin instructions. Chew 4 tablets (4000 mg) by mouth with each meal and 2 tablets (2000 mg) with snacks  . LORazepam (ATIVAN) 0.5 MG tablet Take a 1/2 tablet 30 minutes before MRI  . multivitamin (RENA-VIT) TABS tablet Take 1 tablet by mouth daily.  Marland Kitchen omeprazole (PRILOSEC) 40 MG capsule Take 1 capsule (40 mg total) by mouth daily.  . ondansetron (ZOFRAN) 4 MG tablet Take 1 tablet (4 mg total) by mouth every 8 (eight) hours as needed for nausea or vomiting.  [name] test strip 1 each by Other route 3 (three) times daily.   . OXYGEN Inhale 2 L into the lungs continuous.  . prednisoLONE acetate (PRED FORTE) 1 % ophthalmic suspension Place 1 drop into the right eye daily. For eye pain   Past Medical History:  Diagnosis Date  . Anemia   . Anemia   . Anxiety   . Arthritis    "joints" (06/15/2013)  . Asthma   . 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 Blood transfusion without reported diagnosis   . Breast cancer ([place])    left  . CHF (congestive heart failure) ([place])   . 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 Esophageal ulcer with bleeding   . ESRD (end stage renal disease) ([place])    "suppose to start dialysis today" (06/15/2013)  . Family history of adverse  reaction to anesthesia    It took a while for pt sister to wake from anesthesia  . GERD (gastroesophageal reflux disease)   . GI bleed   . Glaucoma    blind in both eyes  . Heart murmur    Mild AS, moderate MR, moderate TR 10/30/16 echo  . Hx of adenomatous colonic polyps 04/07/2018  . Hypertension   . Myalgia 12/31/2011  . Neuropathy 12/31/2011  . PAD (peripheral artery disease) ([facility])    nonviable tissue left lower extremity  . Shortness of breath    "when she doesn't go to dialysis"  . Type II diabetes mellitus ([place])    Type II   Past Surgical History:  Procedure Laterality Date  . A/V FISTULAGRAM N/A 09/21/2018   Procedure:  A/V FISTULAGRAM - Right Upper;  Surgeon: [name], MD;  Location: [place] [facility] LAB;  Service: Cardiovascular;  Laterality: N/A;  . ABDOMINAL AORTOGRAM N/A 11/30/2018   Procedure: ABDOMINAL AORTOGRAM;  Surgeon: [name], MD;  Location: [place] [facility] LAB;  Service: Cardiovascular;  Laterality: N/A;  . ABDOMINAL AORTOGRAM W/LOWER EXTREMITY Left 07/29/2019   Procedure: ABDOMINAL AORTOGRAM W/LOWER EXTREMITY;  Surgeon: [name], MD;  Location: [place] CV LAB;  Service: Cardiovascular;  Laterality: Left;  . ABDOMINAL HYSTERECTOMY     partial  . AMPUTATION Left 08/30/2019   Procedure: AMPUTATION BELOW KNEE LEFT;  Surgeon: [name], MD;  Location: [facility] OR;  Service: Vascular;  Laterality: Left;  . AMPUTATION Left 10/21/2019   Procedure: Amputation Above Knee;  Surgeon: Rosetta Posner, MD;  Location: [place];  Service: Vascular;  Laterality: Left;  . AMPUTATION Right 11/17/2019   Procedure: AMPUTATION ABOVE KNEE, right leg;  Surgeon: Rosetta Posner, MD;  Location: [place];  Service: Vascular;  Laterality: Right;  . AV FISTULA PLACEMENT Right 06/06/2013   Procedure: ARTERIOVENOUS (AV) FISTULA CREATION-RIGHT BRACHIAL CEPHALIC;  Surgeon: Conrad Reading, MD;  Location: [place];  Service: Vascular;  Laterality: Right;  . BREAST BIOPSY Left   . BREAST  LUMPECTOMY Left    "and took out some lymph nodes" (06/15/2013)  . CARDIAC CATHETERIZATION     04/02/15 [facility]): no angiographic CAD, LVEF 40% with global hypokinesis (LHC done for + stress echo, EF 45% 11/28/14)  . CARPAL TUNNEL RELEASE Left 11/26/2017   Procedure: LEFT CARPAL TUNNEL RELEASE;  Surgeon: [name], MD;  Location: [place];  Service: Orthopedics;  Laterality: Left;  . CATARACT EXTRACTION W/ ANTERIOR VITRECTOMY Bilateral   . CESAREAN SECTION  1980  . COLONOSCOPY    . COLONOSCOPY WITH PROPOFOL N/A 05/25/2020   Procedure: COLONOSCOPY WITH PROPOFOL;  Surgeon: Yetta Flock, MD;  Location: [place];  Service: Gastroenterology;  Laterality: N/A;  . ENTEROSCOPY N/A 12/18/2019   Procedure: ENTEROSCOPY;  Surgeon: Rush Landmark Telford Nab., MD;  Location: [place];  Service: Gastroenterology;  Laterality: N/A;  . ENTEROSCOPY N/A 05/24/2020   Procedure: ENTEROSCOPY;  Surgeon: Jackquline Denmark, MD;  Location: [facility] ENDOSCOPY;  Service: Endoscopy;  Laterality: N/A;  . ESOPHAGOGASTRODUODENOSCOPY N/A 07/08/2017   Procedure: ESOPHAGOGASTRODUODENOSCOPY (EGD);  Surgeon: [name], MD;  Location: [facility] ENDOSCOPY;  Service: Endoscopy;  Laterality: N/A;  . ESOPHAGOGASTRODUODENOSCOPY (EGD) WITH PROPOFOL N/A 02/07/2018   Procedure: ESOPHAGOGASTRODUODENOSCOPY (EGD) WITH PROPOFOL;  Surgeon: [name], MD;  Location: [facility] ENDOSCOPY;  Service: Gastroenterology;  Laterality: N/A;  APC and clips placed  . EYE SURGERY Bilateral    laser surgery  . GIVENS CAPSULE STUDY N/A 12/19/2019   Procedure: GIVENS CAPSULE STUDY;  Surgeon: Irving Copas., MD;  Location: [place];  Service: Gastroenterology;  Laterality: N/A;  . HOT HEMOSTASIS N/A 12/18/2019   Procedure: HOT HEMOSTASIS (ARGON PLASMA COAGULATION/BICAP);  Surgeon: Irving Copas., MD;  Location: [place];  Service: Gastroenterology;  Laterality: N/A;  . HOT HEMOSTASIS Right 05/24/2020   Procedure: HOT HEMOSTASIS  (ARGON PLASMA COAGULATION/BICAP);  Surgeon: Jackquline Denmark, MD;  Location: [facility] ENDOSCOPY;  Service: Endoscopy;  Laterality: Right;  r colon  . LOWER EXTREMITY ANGIOGRAPHY Bilateral 11/30/2018   Procedure: LOWER EXTREMITY ANGIOGRAPHY;  Surgeon: [name], MD;  Location: [place] CV LAB;  Service: Cardiovascular;  Laterality: Bilateral;  . PARS PLANA VITRECTOMY Right 05/05/2017   Procedure: PARS PLANA VITRECTOMY WITH 25 GAUGE; PARTIAL REMOVAL  OF OIL; INFERIOR PERIPHERAL IRIDECTOMY, REFORM ANTERIOR CHAMBER RIGHT EYE;  Surgeon: [name], MD;  Location: [place];  Service: Ophthalmology;  Laterality: Right;  . PERIPHERAL VASCULAR BALLOON ANGIOPLASTY Right 09/21/2018   Procedure: PERIPHERAL VASCULAR BALLOON ANGIOPLASTY;  Surgeon: [name], MD;  Location: [place] CV LAB;  Service: Cardiovascular;  Laterality: Right;  AV fistula  . PERIPHERAL VASCULAR BALLOON ANGIOPLASTY Left 11/30/2018   Procedure: PERIPHERAL VASCULAR BALLOON ANGIOPLASTY;  Surgeon: [name], MD;  Location: [place] CV LAB;  Service: Cardiovascular;  Laterality: Left;  Left Anterior Tibial Artery  . POLYPECTOMY  05/25/2020   Procedure: POLYPECTOMY;  Surgeon: Yetta Flock, MD;  Location: [facility] ENDOSCOPY;  Service: Gastroenterology;;  . REFRACTIVE SURGERY Bilateral   . REMOVAL OF A DIALYSIS CATHETER Right 06/06/2013   Procedure: REMOVAL OF RIGHT MEDIPORT;  Surgeon: Conrad Lincoln, MD;  Location: [place];  Service: Vascular;  Laterality: Right;  . SUBMUCOSAL TATTOO INJECTION  12/18/2019   Procedure: SUBMUCOSAL TATTOO INJECTION;  Surgeon: Irving Copas., MD;  Location: [place];  Service: Gastroenterology;;  . TONSILLECTOMY    . UPPER GASTROINTESTINAL ENDOSCOPY    . WOUND DEBRIDEMENT Left 09/30/2019   Procedure: IRRIGATION AND DEBRIDEMENT WOUND OF LEFT BELOW KNEE AMPUTATION;  Surgeon: [name], MD;  Location: [facility] OR;  Service: Vascular;  Laterality: Left;   Social History   Social History  Narrative   She reports she is single with one son and one daughter. 3 caffeinated beverages a day. She dialyzes Monday Wednesday Friday at the [facility].      Her sister is her primary caretaker and spends the nights with her   family history includes Diabetes in her brother, mother, and sister; Heart attack in her brother; Hyperlipidemia in her mother; Hypertension in her brother, father, and mother; Kidney disease in her brother.   Review of Systems  As per HPI Objective:   Physical Exam Pulse 74   LMP  (LMP Unknown)  chronically ill bw in wheelchair abd obese, soft NT and BS+ Bilateral BKA

## 2020-08-15 NOTE — Patient Instructions (Signed)
Sorry you are feeling bad.  Keep taking the Imodium AD as needed to help diarrhea  Try ondansetron for nausea.  Talk to the dialysis nurse or doctor about the cramps - sometimes adjustments in the dialysis methods are needed to reduce the cramps.  I hope you feel a lot better soon.  We will place a reminder to check the liver again in early May.  I appreciate the opportunity to care for you. [name], MD, [facility]

## 2020-08-15 NOTE — Telephone Encounter (Signed)
Spoke to sister [name]) and she will bring the patient for 2:50pm appt. Today with Dr. Carlean Purl.

## 2020-08-15 NOTE — Telephone Encounter (Signed)
Spoke to [facility] CMA and gave heads-up that patient was added on this afternoon to Dr. Celesta Aver schedule

## 2020-08-15 NOTE — Telephone Encounter (Signed)
I have faxed office notes from [facility] 08-15-2020 office visit to [facility] , phone [phone] and fax # [phone]. Will place reminder to set up CT scan and to do labs.

## 2020-08-15 NOTE — Telephone Encounter (Signed)
-----   Message from [name], MD sent at 08/15/2020  4:14 PM EST ----- Regarding: 2 things Please send a copy of my note to the [facility] kidney center dialysis center where she gets dialyzed  Place a reminder for a CT of the abdomen liver protocol or early May or have [facility] do this if you do not keep track of these  She will not need a BUN and creatinine because she is on hemodialysis the reason is for a right liver lesion

## 2020-08-17 DIAGNOSIS — D631 Anemia in chronic kidney disease: Secondary | ICD-10-CM | POA: Diagnosis not present

## 2020-08-17 DIAGNOSIS — N186 End stage renal disease: Secondary | ICD-10-CM | POA: Diagnosis not present

## 2020-08-17 DIAGNOSIS — Z992 Dependence on renal dialysis: Secondary | ICD-10-CM | POA: Diagnosis not present

## 2020-08-17 DIAGNOSIS — D509 Iron deficiency anemia, unspecified: Secondary | ICD-10-CM | POA: Diagnosis not present

## 2020-08-17 DIAGNOSIS — N2581 Secondary hyperparathyroidism of renal origin: Secondary | ICD-10-CM | POA: Diagnosis not present

## 2020-08-20 DIAGNOSIS — Z992 Dependence on renal dialysis: Secondary | ICD-10-CM | POA: Diagnosis not present

## 2020-08-20 DIAGNOSIS — D631 Anemia in chronic kidney disease: Secondary | ICD-10-CM | POA: Diagnosis not present

## 2020-08-20 DIAGNOSIS — N2581 Secondary hyperparathyroidism of renal origin: Secondary | ICD-10-CM | POA: Diagnosis not present

## 2020-08-20 DIAGNOSIS — D509 Iron deficiency anemia, unspecified: Secondary | ICD-10-CM | POA: Diagnosis not present

## 2020-08-20 DIAGNOSIS — N186 End stage renal disease: Secondary | ICD-10-CM | POA: Diagnosis not present

## 2020-08-21 DIAGNOSIS — N186 End stage renal disease: Secondary | ICD-10-CM | POA: Diagnosis not present

## 2020-08-21 DIAGNOSIS — D649 Anemia, unspecified: Secondary | ICD-10-CM | POA: Diagnosis not present

## 2020-08-21 DIAGNOSIS — F419 Anxiety disorder, unspecified: Secondary | ICD-10-CM | POA: Diagnosis not present

## 2020-08-21 DIAGNOSIS — E1129 Type 2 diabetes mellitus with other diabetic kidney complication: Secondary | ICD-10-CM | POA: Diagnosis not present

## 2020-08-21 DIAGNOSIS — Z515 Encounter for palliative care: Secondary | ICD-10-CM | POA: Diagnosis not present

## 2020-08-21 DIAGNOSIS — Z853 Personal history of malignant neoplasm of breast: Secondary | ICD-10-CM | POA: Diagnosis not present

## 2020-08-21 DIAGNOSIS — K219 Gastro-esophageal reflux disease without esophagitis: Secondary | ICD-10-CM | POA: Diagnosis not present

## 2020-08-21 DIAGNOSIS — E1122 Type 2 diabetes mellitus with diabetic chronic kidney disease: Secondary | ICD-10-CM | POA: Diagnosis not present

## 2020-08-21 DIAGNOSIS — I132 Hypertensive heart and chronic kidney disease with heart failure and with stage 5 chronic kidney disease, or end stage renal disease: Secondary | ICD-10-CM | POA: Diagnosis not present

## 2020-08-21 DIAGNOSIS — Z89511 Acquired absence of right leg below knee: Secondary | ICD-10-CM | POA: Diagnosis not present

## 2020-08-21 DIAGNOSIS — I5042 Chronic combined systolic (congestive) and diastolic (congestive) heart failure: Secondary | ICD-10-CM | POA: Diagnosis not present

## 2020-08-21 DIAGNOSIS — Z89512 Acquired absence of left leg below knee: Secondary | ICD-10-CM | POA: Diagnosis not present

## 2020-08-21 DIAGNOSIS — Z992 Dependence on renal dialysis: Secondary | ICD-10-CM | POA: Diagnosis not present

## 2020-08-22 DIAGNOSIS — N186 End stage renal disease: Secondary | ICD-10-CM | POA: Diagnosis not present

## 2020-08-22 DIAGNOSIS — N2581 Secondary hyperparathyroidism of renal origin: Secondary | ICD-10-CM | POA: Diagnosis not present

## 2020-08-22 DIAGNOSIS — D509 Iron deficiency anemia, unspecified: Secondary | ICD-10-CM | POA: Diagnosis not present

## 2020-08-22 DIAGNOSIS — D631 Anemia in chronic kidney disease: Secondary | ICD-10-CM | POA: Diagnosis not present

## 2020-08-22 DIAGNOSIS — Z992 Dependence on renal dialysis: Secondary | ICD-10-CM | POA: Diagnosis not present

## 2020-08-23 ENCOUNTER — Encounter: Payer: BLUE CROSS/BLUE SHIELD | Admitting: Physical Therapy

## 2020-08-23 DIAGNOSIS — N186 End stage renal disease: Secondary | ICD-10-CM | POA: Diagnosis not present

## 2020-08-23 DIAGNOSIS — F419 Anxiety disorder, unspecified: Secondary | ICD-10-CM | POA: Diagnosis not present

## 2020-08-23 DIAGNOSIS — I132 Hypertensive heart and chronic kidney disease with heart failure and with stage 5 chronic kidney disease, or end stage renal disease: Secondary | ICD-10-CM | POA: Diagnosis not present

## 2020-08-23 DIAGNOSIS — E1122 Type 2 diabetes mellitus with diabetic chronic kidney disease: Secondary | ICD-10-CM | POA: Diagnosis not present

## 2020-08-23 DIAGNOSIS — I5042 Chronic combined systolic (congestive) and diastolic (congestive) heart failure: Secondary | ICD-10-CM | POA: Diagnosis not present

## 2020-08-23 DIAGNOSIS — D649 Anemia, unspecified: Secondary | ICD-10-CM | POA: Diagnosis not present

## 2020-08-24 DIAGNOSIS — N186 End stage renal disease: Secondary | ICD-10-CM | POA: Diagnosis not present

## 2020-08-24 DIAGNOSIS — N2581 Secondary hyperparathyroidism of renal origin: Secondary | ICD-10-CM | POA: Diagnosis not present

## 2020-08-24 DIAGNOSIS — Z992 Dependence on renal dialysis: Secondary | ICD-10-CM | POA: Diagnosis not present

## 2020-08-24 DIAGNOSIS — D631 Anemia in chronic kidney disease: Secondary | ICD-10-CM | POA: Diagnosis not present

## 2020-08-24 DIAGNOSIS — D509 Iron deficiency anemia, unspecified: Secondary | ICD-10-CM | POA: Diagnosis not present

## 2020-08-27 DIAGNOSIS — N186 End stage renal disease: Secondary | ICD-10-CM | POA: Diagnosis not present

## 2020-08-27 DIAGNOSIS — Z992 Dependence on renal dialysis: Secondary | ICD-10-CM | POA: Diagnosis not present

## 2020-08-27 DIAGNOSIS — N2581 Secondary hyperparathyroidism of renal origin: Secondary | ICD-10-CM | POA: Diagnosis not present

## 2020-08-27 DIAGNOSIS — D509 Iron deficiency anemia, unspecified: Secondary | ICD-10-CM | POA: Diagnosis not present

## 2020-08-27 DIAGNOSIS — D631 Anemia in chronic kidney disease: Secondary | ICD-10-CM | POA: Diagnosis not present

## 2020-08-28 DIAGNOSIS — I5042 Chronic combined systolic (congestive) and diastolic (congestive) heart failure: Secondary | ICD-10-CM | POA: Diagnosis not present

## 2020-08-28 DIAGNOSIS — D649 Anemia, unspecified: Secondary | ICD-10-CM | POA: Diagnosis not present

## 2020-08-28 DIAGNOSIS — I132 Hypertensive heart and chronic kidney disease with heart failure and with stage 5 chronic kidney disease, or end stage renal disease: Secondary | ICD-10-CM | POA: Diagnosis not present

## 2020-08-28 DIAGNOSIS — N186 End stage renal disease: Secondary | ICD-10-CM | POA: Diagnosis not present

## 2020-08-28 DIAGNOSIS — F419 Anxiety disorder, unspecified: Secondary | ICD-10-CM | POA: Diagnosis not present

## 2020-08-28 DIAGNOSIS — E1122 Type 2 diabetes mellitus with diabetic chronic kidney disease: Secondary | ICD-10-CM | POA: Diagnosis not present

## 2020-08-29 DIAGNOSIS — N186 End stage renal disease: Secondary | ICD-10-CM | POA: Diagnosis not present

## 2020-08-29 DIAGNOSIS — Z992 Dependence on renal dialysis: Secondary | ICD-10-CM | POA: Diagnosis not present

## 2020-08-29 DIAGNOSIS — N2581 Secondary hyperparathyroidism of renal origin: Secondary | ICD-10-CM | POA: Diagnosis not present

## 2020-08-29 DIAGNOSIS — D631 Anemia in chronic kidney disease: Secondary | ICD-10-CM | POA: Diagnosis not present

## 2020-08-29 DIAGNOSIS — D509 Iron deficiency anemia, unspecified: Secondary | ICD-10-CM | POA: Diagnosis not present

## 2020-08-31 DIAGNOSIS — N186 End stage renal disease: Secondary | ICD-10-CM | POA: Diagnosis not present

## 2020-08-31 DIAGNOSIS — D509 Iron deficiency anemia, unspecified: Secondary | ICD-10-CM | POA: Diagnosis not present

## 2020-08-31 DIAGNOSIS — D631 Anemia in chronic kidney disease: Secondary | ICD-10-CM | POA: Diagnosis not present

## 2020-08-31 DIAGNOSIS — N2581 Secondary hyperparathyroidism of renal origin: Secondary | ICD-10-CM | POA: Diagnosis not present

## 2020-08-31 DIAGNOSIS — Z992 Dependence on renal dialysis: Secondary | ICD-10-CM | POA: Diagnosis not present

## 2020-09-03 DIAGNOSIS — I5042 Chronic combined systolic (congestive) and diastolic (congestive) heart failure: Secondary | ICD-10-CM | POA: Diagnosis not present

## 2020-09-03 DIAGNOSIS — F419 Anxiety disorder, unspecified: Secondary | ICD-10-CM | POA: Diagnosis not present

## 2020-09-03 DIAGNOSIS — Z992 Dependence on renal dialysis: Secondary | ICD-10-CM | POA: Diagnosis not present

## 2020-09-03 DIAGNOSIS — N186 End stage renal disease: Secondary | ICD-10-CM | POA: Diagnosis not present

## 2020-09-03 DIAGNOSIS — D631 Anemia in chronic kidney disease: Secondary | ICD-10-CM | POA: Diagnosis not present

## 2020-09-03 DIAGNOSIS — I132 Hypertensive heart and chronic kidney disease with heart failure and with stage 5 chronic kidney disease, or end stage renal disease: Secondary | ICD-10-CM | POA: Diagnosis not present

## 2020-09-03 DIAGNOSIS — N2581 Secondary hyperparathyroidism of renal origin: Secondary | ICD-10-CM | POA: Diagnosis not present

## 2020-09-03 DIAGNOSIS — D649 Anemia, unspecified: Secondary | ICD-10-CM | POA: Diagnosis not present

## 2020-09-03 DIAGNOSIS — D509 Iron deficiency anemia, unspecified: Secondary | ICD-10-CM | POA: Diagnosis not present

## 2020-09-03 DIAGNOSIS — E1122 Type 2 diabetes mellitus with diabetic chronic kidney disease: Secondary | ICD-10-CM | POA: Diagnosis not present

## 2020-09-04 ENCOUNTER — Other Ambulatory Visit: Payer: Self-pay | Admitting: *Deleted

## 2020-09-04 DIAGNOSIS — N186 End stage renal disease: Secondary | ICD-10-CM | POA: Diagnosis not present

## 2020-09-04 DIAGNOSIS — F419 Anxiety disorder, unspecified: Secondary | ICD-10-CM | POA: Diagnosis not present

## 2020-09-04 DIAGNOSIS — I5042 Chronic combined systolic (congestive) and diastolic (congestive) heart failure: Secondary | ICD-10-CM | POA: Diagnosis not present

## 2020-09-04 DIAGNOSIS — D649 Anemia, unspecified: Secondary | ICD-10-CM | POA: Diagnosis not present

## 2020-09-04 DIAGNOSIS — I132 Hypertensive heart and chronic kidney disease with heart failure and with stage 5 chronic kidney disease, or end stage renal disease: Secondary | ICD-10-CM | POA: Diagnosis not present

## 2020-09-04 DIAGNOSIS — E1122 Type 2 diabetes mellitus with diabetic chronic kidney disease: Secondary | ICD-10-CM | POA: Diagnosis not present

## 2020-09-04 NOTE — Patient Outreach (Signed)
[place] [facility]) Care Management  09/04/2020  LOUIS GAW 1951/06/22 [id number]  Unsuccessful outreach attempt made to [name]. [name] answered the phone and stated that the patient was sleeping. She asked if this nurse could call back on Thursday after 1000.  Plan: RN Health Coach will call patient/sister back on  09/06/20 after 1000.  Emelia Loron RN, BSN [place] [phone] [email or web address]

## 2020-09-05 DIAGNOSIS — N2581 Secondary hyperparathyroidism of renal origin: Secondary | ICD-10-CM | POA: Diagnosis not present

## 2020-09-05 DIAGNOSIS — D631 Anemia in chronic kidney disease: Secondary | ICD-10-CM | POA: Diagnosis not present

## 2020-09-05 DIAGNOSIS — N186 End stage renal disease: Secondary | ICD-10-CM | POA: Diagnosis not present

## 2020-09-05 DIAGNOSIS — Z992 Dependence on renal dialysis: Secondary | ICD-10-CM | POA: Diagnosis not present

## 2020-09-05 DIAGNOSIS — D509 Iron deficiency anemia, unspecified: Secondary | ICD-10-CM | POA: Diagnosis not present

## 2020-09-06 ENCOUNTER — Other Ambulatory Visit: Payer: Self-pay | Admitting: *Deleted

## 2020-09-06 DIAGNOSIS — E1122 Type 2 diabetes mellitus with diabetic chronic kidney disease: Secondary | ICD-10-CM | POA: Diagnosis not present

## 2020-09-06 DIAGNOSIS — I132 Hypertensive heart and chronic kidney disease with heart failure and with stage 5 chronic kidney disease, or end stage renal disease: Secondary | ICD-10-CM | POA: Diagnosis not present

## 2020-09-06 DIAGNOSIS — D649 Anemia, unspecified: Secondary | ICD-10-CM | POA: Diagnosis not present

## 2020-09-06 DIAGNOSIS — F419 Anxiety disorder, unspecified: Secondary | ICD-10-CM | POA: Diagnosis not present

## 2020-09-06 DIAGNOSIS — I5042 Chronic combined systolic (congestive) and diastolic (congestive) heart failure: Secondary | ICD-10-CM | POA: Diagnosis not present

## 2020-09-06 DIAGNOSIS — N186 End stage renal disease: Secondary | ICD-10-CM | POA: Diagnosis not present

## 2020-09-06 NOTE — Patient Outreach (Deleted)
[place] [facility]) Care Management  09/06/2020  [name] [birth year] [id number]  Unsuccessful outreach attempt made to [name]. RN Health Coach left HIPAA compliant voicemail message along with her contact information.  Plan: RN Health Coach will call patient within the month of March.  Emelia Loron RN, BSN [place] [phone] [email or web address]

## 2020-09-07 ENCOUNTER — Encounter: Payer: Self-pay | Admitting: *Deleted

## 2020-09-07 DIAGNOSIS — D631 Anemia in chronic kidney disease: Secondary | ICD-10-CM | POA: Diagnosis not present

## 2020-09-07 DIAGNOSIS — D509 Iron deficiency anemia, unspecified: Secondary | ICD-10-CM | POA: Diagnosis not present

## 2020-09-07 DIAGNOSIS — N186 End stage renal disease: Secondary | ICD-10-CM | POA: Diagnosis not present

## 2020-09-07 DIAGNOSIS — N2581 Secondary hyperparathyroidism of renal origin: Secondary | ICD-10-CM | POA: Diagnosis not present

## 2020-09-07 DIAGNOSIS — Z992 Dependence on renal dialysis: Secondary | ICD-10-CM | POA: Diagnosis not present

## 2020-09-07 NOTE — Patient Outreach (Signed)
[place] [facility]) Care Management  [place]  09/06/20  [name] 1951-06-28 [id number]  Subjective: Successful telephone outreach call to patient and sister [name]. HIPAA identifiers obtained. Patient states she is doing well. She continues to go to dialysis via [facility] on M-W-F. Patient reports tolerating this well other than having abdominal cramping during the procedure at times. The dialysis center and providers are aware of this and do stop the dialysis briefly which does help. Patient and [name] explain that the patient needs to have her bathroom door widen to allow her wheel chair to go through. Currently the patient has to wash and use the bathroom in her bedroom. They also explain that the patient's wheelchair ramp is in need of repair and that the pad to the patient's hoyer lift has worn out. Nurse provided [name] with the phone number of [employer] who build wheelchair ramps for individuals and nurse discussed the Aging Gracefully program with the patient. Nurse will call [employer] and refer the patient to the Aging Gracefully program and nurse provided [name] with [place] phone number for her to call and request a replacement pad for the hoyer lift. Patient and sister report that the patient's diabetes is well controlled. Her blood sugar ranges are 80-135 and her last A1c was 5.2 on 05/22/20. Patient did not have any further questions or concerns today and did confirm that the patient has this nurse's contact number to call her if needed.   Encounter Medications:  Outpatient Encounter Medications as of 09/06/2020  Medication Sig Note  . acetaminophen (TYLENOL) 500 MG tablet Take 500 mg by mouth every 6 (six) hours as needed for headache (pain). 06/08/2020: Pt plans to discuss with MD at next office visit. Has not taken since hospital admission.  Marland Kitchen albuterol (PROVENTIL) (2.5 MG/3ML) 0.083% nebulizer solution Take 3 mLs (2.5 mg total) by  nebulization every 6 (six) hours as needed for wheezing or shortness of breath.   Marland Kitchen albuterol (VENTOLIN HFA) 108 (90 Base) MCG/ACT inhaler Inhale 2 puffs into the lungs every 6 (six) hours as needed for wheezing or shortness of breath.    Marland Kitchen atropine 1 % ophthalmic solution Place 1 drop into the right eye 2 (two) times daily as needed (dry eyes).    . BD PEN NEEDLE NANO 2ND GEN 32G X 4 MM MISC in the morning, at noon, and at bedtime.    . darbepoetin (ARANESP) 200 MCG/0.4ML SOLN injection Inject 0.4 mLs (200 mcg total) into the vein every Wednesday with hemodialysis. 06/08/2020: Dialysis?  Marland Kitchen diclofenac sodium (VOLTAREN) 1 % GEL Apply 2 g topically 4 (four) times daily as needed (pain).    Marland Kitchen diphenhydrAMINE (BENADRYL) 25 MG tablet Take 25 mg by mouth See admin instructions. Take one tablet (25 mg) by mouth daily at bedtime, and take one tablet (25 mg) by mouth on Monday, Wednesday, Friday mornings before dialysis   . doxercalciferol (HECTOROL) 4 MCG/2ML injection Inject 0.5 mLs (1 mcg total) into the vein every Monday, Wednesday, and Friday with hemodialysis. 06/08/2020: At dialysis?  . fluticasone (FLONASE) 50 MCG/ACT nasal spray Place 1 spray into both nostrils daily as needed for allergies or rhinitis (congestion).    . gabapentin (NEURONTIN) 100 MG capsule Take 100 mg by mouth 2 (two) times daily.   . insulin aspart (NOVOLOG) 100 UNIT/ML FlexPen Inject 2-10 Units into the skin 2 (two) times daily with a meal. Per CBG   . lanthanum (FOSRENOL) 1000 MG chewable tablet  Chew 2,000-4,000 mg by mouth See admin instructions. Chew 4 tablets (4000 mg) by mouth with each meal and 2 tablets (2000 mg) with snacks   . LORazepam (ATIVAN) 0.5 MG tablet Take a 1/2 tablet 30 minutes before MRI   . multivitamin (RENA-VIT) TABS tablet Take 1 tablet by mouth daily.   Marland Kitchen omeprazole (PRILOSEC) 40 MG capsule Take 1 capsule (40 mg total) by mouth daily.   . ondansetron (ZOFRAN) 4 MG tablet Take 1 tablet (4 mg total) by mouth  every 8 (eight) hours as needed for nausea or vomiting.   [name] test strip 1 each by Other route 3 (three) times daily.    . OXYGEN Inhale 2 L into the lungs continuous.   . prednisoLONE acetate (PRED FORTE) 1 % ophthalmic suspension Place 1 drop into the right eye daily. For eye pain    No facility-administered encounter medications on file as of 09/06/2020.    Functional Status:  In your present state of health, do you have any difficulty performing the following activities: 05/23/2020 11/24/2019  Hearing? N N  Vision? Y Y  Comment - Bilnd  Difficulty concentrating or making decisions? N N  Walking or climbing stairs? Y Y  Comment bilateral AKA Bilateral amputation  Dressing or bathing? Y Y  Comment - Family assistance with needed  Doing errands, shopping? Y Y  Comment - Family assist with this task  Preparing Food and eating ? - Y  Comment - Family assist  Using the Toilet? - N  Comment - Bedside toliet  In the past six months, have you accidently leaked urine? - N  Do you have problems with loss of bowel control? - N  Managing your Medications? - N  Managing your Finances? - Y  Comment - Blind family assist  Housekeeping or managing your Housekeeping? - Y  Comment - Fmily assist with this task  Some recent data might be hidden    Fall/Depression Screening: Fall Risk  11/24/2019  Falls in the past year? 0   PHQ 2/9 Scores 09/07/2020 11/24/2019  PHQ - 2 Score 1 0    Assessment:  Goals Addressed            This Visit's Progress   . [facility] Patient will do chair and bed exercises 2 times weekly within the next 30 days       Timeframe:  Long-Range Goal Priority:  High Start Date: 09/06/20                            Expected End Date: 2/30/22  Patient reports she wants to increase her physical strength and mobility. Patient states she will begin doing chair and bed exercises 2 times weekly.                       . [facility]-Monitor and Manage My Blood Sugar        Timeframe:  Long-Range Goal Priority:  High Start Date:  05/01/20                           Expected End Date: 05/19/21                      Follow Up Date 12/17/20   - check blood sugar at prescribed times - check blood sugar if I feel it is too high or too low -  take the blood sugar log to all doctor visits - take the blood sugar meter to all doctor visits    Why is this important?   Checking your blood sugar at home helps to keep it from getting very high or very low.  Writing the results in a diary or log helps the doctor know how to care for you.  Your blood sugar log should have the time, date and the results.  Also, write down the amount of insulin or other medicine that you take.  Other information, like what you ate, exercise done and how you were feeling, will also be helpful.     Notes: Adherent to all medical appointments Updated 09/06/20: Patient takes her blood sugar 2-3 times daily and does record the values. Nurse will send patient a calendar booklet. Patient's A1c goal is 6 and her current A1c is 5.2      Plan: RN Health Coach will send PCP today's assessment note and a barrier letter, will refer the patient to the Aging Gracefully program, will send the patient advance directive documents, a calendar booklet, upper extremity chair exercise printouts, will call patient within the month of May. Follow-up:  Patient agrees to Care Plan and Follow-up.   Emelia Loron RN, BSN [place] [phone] [email or web address]

## 2020-09-07 NOTE — Patient Instructions (Addendum)
Goals Addressed            This Visit's Progress   . [facility] Patient will do chair and bed exercises 2 times weekly within the next 30 days       Timeframe:  Long-Range Goal Priority:  High Start Date: 09/06/20                            Expected End Date: 2/30/22  Patient reports she wants to increase her physical strength and mobility. Patient states she will begin doing chair and bed exercises 2 times weekly. Nurse encouraged patient to exercise in her bed by rolling side to side and to do stretches. Nurse will send patient upper extremity chair exercise printouts.                       . [facility]-Monitor and Manage My Blood Sugar       Timeframe:  Long-Range Goal Priority:  High Start Date:  05/01/20                           Expected End Date: 05/19/21                      Follow Up Date 12/17/20   - check blood sugar at prescribed times - check blood sugar if I feel it is too high or too low - take the blood sugar log to all doctor visits - take the blood sugar meter to all doctor visits    Why is this important?   Checking your blood sugar at home helps to keep it from getting very high or very low.  Writing the results in a diary or log helps the doctor know how to care for you.  Your blood sugar log should have the time, date and the results.  Also, write down the amount of insulin or other medicine that you take.  Other information, like what you ate, exercise done and how you were feeling, will also be helpful.     Notes: Adherent to all medical appointments Updated 09/06/20: Patient takes her blood sugar 2-3 times daily and does record the values. Nurse will send patient a calendar booklet. Patient's A1c goal is 6 and her current A1c is 5.2

## 2020-09-10 DIAGNOSIS — Z992 Dependence on renal dialysis: Secondary | ICD-10-CM | POA: Diagnosis not present

## 2020-09-10 DIAGNOSIS — D631 Anemia in chronic kidney disease: Secondary | ICD-10-CM | POA: Diagnosis not present

## 2020-09-10 DIAGNOSIS — N186 End stage renal disease: Secondary | ICD-10-CM | POA: Diagnosis not present

## 2020-09-10 DIAGNOSIS — N2581 Secondary hyperparathyroidism of renal origin: Secondary | ICD-10-CM | POA: Diagnosis not present

## 2020-09-10 DIAGNOSIS — D509 Iron deficiency anemia, unspecified: Secondary | ICD-10-CM | POA: Diagnosis not present

## 2020-09-12 DIAGNOSIS — D509 Iron deficiency anemia, unspecified: Secondary | ICD-10-CM | POA: Diagnosis not present

## 2020-09-12 DIAGNOSIS — N186 End stage renal disease: Secondary | ICD-10-CM | POA: Diagnosis not present

## 2020-09-12 DIAGNOSIS — D631 Anemia in chronic kidney disease: Secondary | ICD-10-CM | POA: Diagnosis not present

## 2020-09-12 DIAGNOSIS — N2581 Secondary hyperparathyroidism of renal origin: Secondary | ICD-10-CM | POA: Diagnosis not present

## 2020-09-12 DIAGNOSIS — Z992 Dependence on renal dialysis: Secondary | ICD-10-CM | POA: Diagnosis not present

## 2020-09-13 DIAGNOSIS — N186 End stage renal disease: Secondary | ICD-10-CM | POA: Diagnosis not present

## 2020-09-13 DIAGNOSIS — D649 Anemia, unspecified: Secondary | ICD-10-CM | POA: Diagnosis not present

## 2020-09-13 DIAGNOSIS — I5042 Chronic combined systolic (congestive) and diastolic (congestive) heart failure: Secondary | ICD-10-CM | POA: Diagnosis not present

## 2020-09-13 DIAGNOSIS — F419 Anxiety disorder, unspecified: Secondary | ICD-10-CM | POA: Diagnosis not present

## 2020-09-13 DIAGNOSIS — I132 Hypertensive heart and chronic kidney disease with heart failure and with stage 5 chronic kidney disease, or end stage renal disease: Secondary | ICD-10-CM | POA: Diagnosis not present

## 2020-09-13 DIAGNOSIS — E1122 Type 2 diabetes mellitus with diabetic chronic kidney disease: Secondary | ICD-10-CM | POA: Diagnosis not present

## 2020-09-14 DIAGNOSIS — N186 End stage renal disease: Secondary | ICD-10-CM | POA: Diagnosis not present

## 2020-09-14 DIAGNOSIS — D631 Anemia in chronic kidney disease: Secondary | ICD-10-CM | POA: Diagnosis not present

## 2020-09-14 DIAGNOSIS — Z992 Dependence on renal dialysis: Secondary | ICD-10-CM | POA: Diagnosis not present

## 2020-09-14 DIAGNOSIS — N2581 Secondary hyperparathyroidism of renal origin: Secondary | ICD-10-CM | POA: Diagnosis not present

## 2020-09-14 DIAGNOSIS — D509 Iron deficiency anemia, unspecified: Secondary | ICD-10-CM | POA: Diagnosis not present

## 2020-09-17 DIAGNOSIS — D631 Anemia in chronic kidney disease: Secondary | ICD-10-CM | POA: Diagnosis not present

## 2020-09-17 DIAGNOSIS — N2581 Secondary hyperparathyroidism of renal origin: Secondary | ICD-10-CM | POA: Diagnosis not present

## 2020-09-17 DIAGNOSIS — Z992 Dependence on renal dialysis: Secondary | ICD-10-CM | POA: Diagnosis not present

## 2020-09-17 DIAGNOSIS — N186 End stage renal disease: Secondary | ICD-10-CM | POA: Diagnosis not present

## 2020-09-17 DIAGNOSIS — D509 Iron deficiency anemia, unspecified: Secondary | ICD-10-CM | POA: Diagnosis not present

## 2020-09-18 DIAGNOSIS — F419 Anxiety disorder, unspecified: Secondary | ICD-10-CM | POA: Diagnosis not present

## 2020-09-18 DIAGNOSIS — Z992 Dependence on renal dialysis: Secondary | ICD-10-CM | POA: Diagnosis not present

## 2020-09-18 DIAGNOSIS — E1122 Type 2 diabetes mellitus with diabetic chronic kidney disease: Secondary | ICD-10-CM | POA: Diagnosis not present

## 2020-09-18 DIAGNOSIS — E1129 Type 2 diabetes mellitus with other diabetic kidney complication: Secondary | ICD-10-CM | POA: Diagnosis not present

## 2020-09-18 DIAGNOSIS — Z515 Encounter for palliative care: Secondary | ICD-10-CM | POA: Diagnosis not present

## 2020-09-18 DIAGNOSIS — I132 Hypertensive heart and chronic kidney disease with heart failure and with stage 5 chronic kidney disease, or end stage renal disease: Secondary | ICD-10-CM | POA: Diagnosis not present

## 2020-09-18 DIAGNOSIS — D649 Anemia, unspecified: Secondary | ICD-10-CM | POA: Diagnosis not present

## 2020-09-18 DIAGNOSIS — Z89512 Acquired absence of left leg below knee: Secondary | ICD-10-CM | POA: Diagnosis not present

## 2020-09-18 DIAGNOSIS — N186 End stage renal disease: Secondary | ICD-10-CM | POA: Diagnosis not present

## 2020-09-18 DIAGNOSIS — I5042 Chronic combined systolic (congestive) and diastolic (congestive) heart failure: Secondary | ICD-10-CM | POA: Diagnosis not present

## 2020-09-18 DIAGNOSIS — K219 Gastro-esophageal reflux disease without esophagitis: Secondary | ICD-10-CM | POA: Diagnosis not present

## 2020-09-18 DIAGNOSIS — Z853 Personal history of malignant neoplasm of breast: Secondary | ICD-10-CM | POA: Diagnosis not present

## 2020-09-18 DIAGNOSIS — Z89511 Acquired absence of right leg below knee: Secondary | ICD-10-CM | POA: Diagnosis not present

## 2020-09-19 DIAGNOSIS — N2581 Secondary hyperparathyroidism of renal origin: Secondary | ICD-10-CM | POA: Diagnosis not present

## 2020-09-19 DIAGNOSIS — D631 Anemia in chronic kidney disease: Secondary | ICD-10-CM | POA: Diagnosis not present

## 2020-09-19 DIAGNOSIS — Z992 Dependence on renal dialysis: Secondary | ICD-10-CM | POA: Diagnosis not present

## 2020-09-19 DIAGNOSIS — D509 Iron deficiency anemia, unspecified: Secondary | ICD-10-CM | POA: Diagnosis not present

## 2020-09-19 DIAGNOSIS — N186 End stage renal disease: Secondary | ICD-10-CM | POA: Diagnosis not present

## 2020-09-20 DIAGNOSIS — N186 End stage renal disease: Secondary | ICD-10-CM | POA: Diagnosis not present

## 2020-09-20 DIAGNOSIS — I132 Hypertensive heart and chronic kidney disease with heart failure and with stage 5 chronic kidney disease, or end stage renal disease: Secondary | ICD-10-CM | POA: Diagnosis not present

## 2020-09-20 DIAGNOSIS — F419 Anxiety disorder, unspecified: Secondary | ICD-10-CM | POA: Diagnosis not present

## 2020-09-20 DIAGNOSIS — I5042 Chronic combined systolic (congestive) and diastolic (congestive) heart failure: Secondary | ICD-10-CM | POA: Diagnosis not present

## 2020-09-20 DIAGNOSIS — E1122 Type 2 diabetes mellitus with diabetic chronic kidney disease: Secondary | ICD-10-CM | POA: Diagnosis not present

## 2020-09-20 DIAGNOSIS — D649 Anemia, unspecified: Secondary | ICD-10-CM | POA: Diagnosis not present

## 2020-09-21 DIAGNOSIS — N2581 Secondary hyperparathyroidism of renal origin: Secondary | ICD-10-CM | POA: Diagnosis not present

## 2020-09-21 DIAGNOSIS — D631 Anemia in chronic kidney disease: Secondary | ICD-10-CM | POA: Diagnosis not present

## 2020-09-21 DIAGNOSIS — Z992 Dependence on renal dialysis: Secondary | ICD-10-CM | POA: Diagnosis not present

## 2020-09-21 DIAGNOSIS — D509 Iron deficiency anemia, unspecified: Secondary | ICD-10-CM | POA: Diagnosis not present

## 2020-09-21 DIAGNOSIS — N186 End stage renal disease: Secondary | ICD-10-CM | POA: Diagnosis not present

## 2020-09-24 DIAGNOSIS — Z992 Dependence on renal dialysis: Secondary | ICD-10-CM | POA: Diagnosis not present

## 2020-09-24 DIAGNOSIS — D509 Iron deficiency anemia, unspecified: Secondary | ICD-10-CM | POA: Diagnosis not present

## 2020-09-24 DIAGNOSIS — N186 End stage renal disease: Secondary | ICD-10-CM | POA: Diagnosis not present

## 2020-09-24 DIAGNOSIS — N2581 Secondary hyperparathyroidism of renal origin: Secondary | ICD-10-CM | POA: Diagnosis not present

## 2020-09-24 DIAGNOSIS — D631 Anemia in chronic kidney disease: Secondary | ICD-10-CM | POA: Diagnosis not present

## 2020-09-26 DIAGNOSIS — N186 End stage renal disease: Secondary | ICD-10-CM | POA: Diagnosis not present

## 2020-09-26 DIAGNOSIS — N2581 Secondary hyperparathyroidism of renal origin: Secondary | ICD-10-CM | POA: Diagnosis not present

## 2020-09-26 DIAGNOSIS — D631 Anemia in chronic kidney disease: Secondary | ICD-10-CM | POA: Diagnosis not present

## 2020-09-26 DIAGNOSIS — D509 Iron deficiency anemia, unspecified: Secondary | ICD-10-CM | POA: Diagnosis not present

## 2020-09-26 DIAGNOSIS — Z992 Dependence on renal dialysis: Secondary | ICD-10-CM | POA: Diagnosis not present

## 2020-09-27 DIAGNOSIS — F419 Anxiety disorder, unspecified: Secondary | ICD-10-CM | POA: Diagnosis not present

## 2020-09-27 DIAGNOSIS — I5042 Chronic combined systolic (congestive) and diastolic (congestive) heart failure: Secondary | ICD-10-CM | POA: Diagnosis not present

## 2020-09-27 DIAGNOSIS — N186 End stage renal disease: Secondary | ICD-10-CM | POA: Diagnosis not present

## 2020-09-27 DIAGNOSIS — E1122 Type 2 diabetes mellitus with diabetic chronic kidney disease: Secondary | ICD-10-CM | POA: Diagnosis not present

## 2020-09-27 DIAGNOSIS — D649 Anemia, unspecified: Secondary | ICD-10-CM | POA: Diagnosis not present

## 2020-09-27 DIAGNOSIS — I132 Hypertensive heart and chronic kidney disease with heart failure and with stage 5 chronic kidney disease, or end stage renal disease: Secondary | ICD-10-CM | POA: Diagnosis not present

## 2020-09-28 DIAGNOSIS — N186 End stage renal disease: Secondary | ICD-10-CM | POA: Diagnosis not present

## 2020-09-28 DIAGNOSIS — D509 Iron deficiency anemia, unspecified: Secondary | ICD-10-CM | POA: Diagnosis not present

## 2020-09-28 DIAGNOSIS — Z992 Dependence on renal dialysis: Secondary | ICD-10-CM | POA: Diagnosis not present

## 2020-09-28 DIAGNOSIS — N2581 Secondary hyperparathyroidism of renal origin: Secondary | ICD-10-CM | POA: Diagnosis not present

## 2020-09-28 DIAGNOSIS — D631 Anemia in chronic kidney disease: Secondary | ICD-10-CM | POA: Diagnosis not present

## 2020-10-01 DIAGNOSIS — N2581 Secondary hyperparathyroidism of renal origin: Secondary | ICD-10-CM | POA: Diagnosis not present

## 2020-10-01 DIAGNOSIS — D509 Iron deficiency anemia, unspecified: Secondary | ICD-10-CM | POA: Diagnosis not present

## 2020-10-01 DIAGNOSIS — N186 End stage renal disease: Secondary | ICD-10-CM | POA: Diagnosis not present

## 2020-10-01 DIAGNOSIS — D631 Anemia in chronic kidney disease: Secondary | ICD-10-CM | POA: Diagnosis not present

## 2020-10-01 DIAGNOSIS — Z992 Dependence on renal dialysis: Secondary | ICD-10-CM | POA: Diagnosis not present

## 2020-10-03 DIAGNOSIS — D631 Anemia in chronic kidney disease: Secondary | ICD-10-CM | POA: Diagnosis not present

## 2020-10-03 DIAGNOSIS — D649 Anemia, unspecified: Secondary | ICD-10-CM | POA: Diagnosis not present

## 2020-10-03 DIAGNOSIS — I132 Hypertensive heart and chronic kidney disease with heart failure and with stage 5 chronic kidney disease, or end stage renal disease: Secondary | ICD-10-CM | POA: Diagnosis not present

## 2020-10-03 DIAGNOSIS — F419 Anxiety disorder, unspecified: Secondary | ICD-10-CM | POA: Diagnosis not present

## 2020-10-03 DIAGNOSIS — N2581 Secondary hyperparathyroidism of renal origin: Secondary | ICD-10-CM | POA: Diagnosis not present

## 2020-10-03 DIAGNOSIS — E1122 Type 2 diabetes mellitus with diabetic chronic kidney disease: Secondary | ICD-10-CM | POA: Diagnosis not present

## 2020-10-03 DIAGNOSIS — I5042 Chronic combined systolic (congestive) and diastolic (congestive) heart failure: Secondary | ICD-10-CM | POA: Diagnosis not present

## 2020-10-03 DIAGNOSIS — D509 Iron deficiency anemia, unspecified: Secondary | ICD-10-CM | POA: Diagnosis not present

## 2020-10-03 DIAGNOSIS — N186 End stage renal disease: Secondary | ICD-10-CM | POA: Diagnosis not present

## 2020-10-03 DIAGNOSIS — Z992 Dependence on renal dialysis: Secondary | ICD-10-CM | POA: Diagnosis not present

## 2020-10-04 DIAGNOSIS — E1122 Type 2 diabetes mellitus with diabetic chronic kidney disease: Secondary | ICD-10-CM | POA: Diagnosis not present

## 2020-10-04 DIAGNOSIS — D649 Anemia, unspecified: Secondary | ICD-10-CM | POA: Diagnosis not present

## 2020-10-04 DIAGNOSIS — N186 End stage renal disease: Secondary | ICD-10-CM | POA: Diagnosis not present

## 2020-10-04 DIAGNOSIS — I132 Hypertensive heart and chronic kidney disease with heart failure and with stage 5 chronic kidney disease, or end stage renal disease: Secondary | ICD-10-CM | POA: Diagnosis not present

## 2020-10-04 DIAGNOSIS — I5042 Chronic combined systolic (congestive) and diastolic (congestive) heart failure: Secondary | ICD-10-CM | POA: Diagnosis not present

## 2020-10-04 DIAGNOSIS — F419 Anxiety disorder, unspecified: Secondary | ICD-10-CM | POA: Diagnosis not present

## 2020-10-05 DIAGNOSIS — D509 Iron deficiency anemia, unspecified: Secondary | ICD-10-CM | POA: Diagnosis not present

## 2020-10-05 DIAGNOSIS — N2581 Secondary hyperparathyroidism of renal origin: Secondary | ICD-10-CM | POA: Diagnosis not present

## 2020-10-05 DIAGNOSIS — N186 End stage renal disease: Secondary | ICD-10-CM | POA: Diagnosis not present

## 2020-10-05 DIAGNOSIS — D631 Anemia in chronic kidney disease: Secondary | ICD-10-CM | POA: Diagnosis not present

## 2020-10-05 DIAGNOSIS — Z992 Dependence on renal dialysis: Secondary | ICD-10-CM | POA: Diagnosis not present

## 2020-10-08 DIAGNOSIS — N2581 Secondary hyperparathyroidism of renal origin: Secondary | ICD-10-CM | POA: Diagnosis not present

## 2020-10-08 DIAGNOSIS — N186 End stage renal disease: Secondary | ICD-10-CM | POA: Diagnosis not present

## 2020-10-08 DIAGNOSIS — Z992 Dependence on renal dialysis: Secondary | ICD-10-CM | POA: Diagnosis not present

## 2020-10-08 DIAGNOSIS — D631 Anemia in chronic kidney disease: Secondary | ICD-10-CM | POA: Diagnosis not present

## 2020-10-08 DIAGNOSIS — D509 Iron deficiency anemia, unspecified: Secondary | ICD-10-CM | POA: Diagnosis not present

## 2020-10-09 DIAGNOSIS — F419 Anxiety disorder, unspecified: Secondary | ICD-10-CM | POA: Diagnosis not present

## 2020-10-09 DIAGNOSIS — D649 Anemia, unspecified: Secondary | ICD-10-CM | POA: Diagnosis not present

## 2020-10-09 DIAGNOSIS — I5042 Chronic combined systolic (congestive) and diastolic (congestive) heart failure: Secondary | ICD-10-CM | POA: Diagnosis not present

## 2020-10-09 DIAGNOSIS — I132 Hypertensive heart and chronic kidney disease with heart failure and with stage 5 chronic kidney disease, or end stage renal disease: Secondary | ICD-10-CM | POA: Diagnosis not present

## 2020-10-09 DIAGNOSIS — N186 End stage renal disease: Secondary | ICD-10-CM | POA: Diagnosis not present

## 2020-10-09 DIAGNOSIS — E1122 Type 2 diabetes mellitus with diabetic chronic kidney disease: Secondary | ICD-10-CM | POA: Diagnosis not present

## 2020-10-10 DIAGNOSIS — T676XXA Heat fatigue, transient, initial encounter: Secondary | ICD-10-CM | POA: Diagnosis not present

## 2020-10-10 DIAGNOSIS — D631 Anemia in chronic kidney disease: Secondary | ICD-10-CM | POA: Diagnosis not present

## 2020-10-10 DIAGNOSIS — N186 End stage renal disease: Secondary | ICD-10-CM | POA: Diagnosis not present

## 2020-10-10 DIAGNOSIS — Z992 Dependence on renal dialysis: Secondary | ICD-10-CM | POA: Diagnosis not present

## 2020-10-10 DIAGNOSIS — E039 Hypothyroidism, unspecified: Secondary | ICD-10-CM | POA: Diagnosis not present

## 2020-10-10 DIAGNOSIS — N2581 Secondary hyperparathyroidism of renal origin: Secondary | ICD-10-CM | POA: Diagnosis not present

## 2020-10-10 DIAGNOSIS — D509 Iron deficiency anemia, unspecified: Secondary | ICD-10-CM | POA: Diagnosis not present

## 2020-10-11 DIAGNOSIS — E1122 Type 2 diabetes mellitus with diabetic chronic kidney disease: Secondary | ICD-10-CM | POA: Diagnosis not present

## 2020-10-11 DIAGNOSIS — F419 Anxiety disorder, unspecified: Secondary | ICD-10-CM | POA: Diagnosis not present

## 2020-10-11 DIAGNOSIS — N186 End stage renal disease: Secondary | ICD-10-CM | POA: Diagnosis not present

## 2020-10-11 DIAGNOSIS — I5042 Chronic combined systolic (congestive) and diastolic (congestive) heart failure: Secondary | ICD-10-CM | POA: Diagnosis not present

## 2020-10-11 DIAGNOSIS — D649 Anemia, unspecified: Secondary | ICD-10-CM | POA: Diagnosis not present

## 2020-10-11 DIAGNOSIS — I132 Hypertensive heart and chronic kidney disease with heart failure and with stage 5 chronic kidney disease, or end stage renal disease: Secondary | ICD-10-CM | POA: Diagnosis not present

## 2020-10-12 DIAGNOSIS — D509 Iron deficiency anemia, unspecified: Secondary | ICD-10-CM | POA: Diagnosis not present

## 2020-10-12 DIAGNOSIS — D631 Anemia in chronic kidney disease: Secondary | ICD-10-CM | POA: Diagnosis not present

## 2020-10-12 DIAGNOSIS — Z992 Dependence on renal dialysis: Secondary | ICD-10-CM | POA: Diagnosis not present

## 2020-10-12 DIAGNOSIS — N186 End stage renal disease: Secondary | ICD-10-CM | POA: Diagnosis not present

## 2020-10-12 DIAGNOSIS — N2581 Secondary hyperparathyroidism of renal origin: Secondary | ICD-10-CM | POA: Diagnosis not present

## 2020-10-13 DIAGNOSIS — F419 Anxiety disorder, unspecified: Secondary | ICD-10-CM | POA: Diagnosis not present

## 2020-10-13 DIAGNOSIS — I132 Hypertensive heart and chronic kidney disease with heart failure and with stage 5 chronic kidney disease, or end stage renal disease: Secondary | ICD-10-CM | POA: Diagnosis not present

## 2020-10-13 DIAGNOSIS — N186 End stage renal disease: Secondary | ICD-10-CM | POA: Diagnosis not present

## 2020-10-13 DIAGNOSIS — I5042 Chronic combined systolic (congestive) and diastolic (congestive) heart failure: Secondary | ICD-10-CM | POA: Diagnosis not present

## 2020-10-13 DIAGNOSIS — D649 Anemia, unspecified: Secondary | ICD-10-CM | POA: Diagnosis not present

## 2020-10-13 DIAGNOSIS — E1122 Type 2 diabetes mellitus with diabetic chronic kidney disease: Secondary | ICD-10-CM | POA: Diagnosis not present

## 2020-10-15 DIAGNOSIS — D631 Anemia in chronic kidney disease: Secondary | ICD-10-CM | POA: Diagnosis not present

## 2020-10-15 DIAGNOSIS — Z992 Dependence on renal dialysis: Secondary | ICD-10-CM | POA: Diagnosis not present

## 2020-10-15 DIAGNOSIS — N186 End stage renal disease: Secondary | ICD-10-CM | POA: Diagnosis not present

## 2020-10-15 DIAGNOSIS — D509 Iron deficiency anemia, unspecified: Secondary | ICD-10-CM | POA: Diagnosis not present

## 2020-10-15 DIAGNOSIS — N2581 Secondary hyperparathyroidism of renal origin: Secondary | ICD-10-CM | POA: Diagnosis not present

## 2020-10-17 DIAGNOSIS — N2581 Secondary hyperparathyroidism of renal origin: Secondary | ICD-10-CM | POA: Diagnosis not present

## 2020-10-17 DIAGNOSIS — N186 End stage renal disease: Secondary | ICD-10-CM | POA: Diagnosis not present

## 2020-10-17 DIAGNOSIS — Z992 Dependence on renal dialysis: Secondary | ICD-10-CM | POA: Diagnosis not present

## 2020-10-17 DIAGNOSIS — D631 Anemia in chronic kidney disease: Secondary | ICD-10-CM | POA: Diagnosis not present

## 2020-10-17 DIAGNOSIS — D509 Iron deficiency anemia, unspecified: Secondary | ICD-10-CM | POA: Diagnosis not present

## 2020-10-19 DIAGNOSIS — J9611 Chronic respiratory failure with hypoxia: Secondary | ICD-10-CM | POA: Diagnosis not present

## 2020-10-19 DIAGNOSIS — N2581 Secondary hyperparathyroidism of renal origin: Secondary | ICD-10-CM | POA: Diagnosis not present

## 2020-10-19 DIAGNOSIS — I132 Hypertensive heart and chronic kidney disease with heart failure and with stage 5 chronic kidney disease, or end stage renal disease: Secondary | ICD-10-CM | POA: Diagnosis not present

## 2020-10-19 DIAGNOSIS — Z992 Dependence on renal dialysis: Secondary | ICD-10-CM | POA: Diagnosis not present

## 2020-10-19 DIAGNOSIS — N186 End stage renal disease: Secondary | ICD-10-CM | POA: Diagnosis not present

## 2020-10-19 DIAGNOSIS — E1129 Type 2 diabetes mellitus with other diabetic kidney complication: Secondary | ICD-10-CM | POA: Diagnosis not present

## 2020-10-19 DIAGNOSIS — I5042 Chronic combined systolic (congestive) and diastolic (congestive) heart failure: Secondary | ICD-10-CM | POA: Diagnosis not present

## 2020-10-19 DIAGNOSIS — D509 Iron deficiency anemia, unspecified: Secondary | ICD-10-CM | POA: Diagnosis not present

## 2020-10-19 DIAGNOSIS — D631 Anemia in chronic kidney disease: Secondary | ICD-10-CM | POA: Diagnosis not present

## 2020-10-19 DIAGNOSIS — E1122 Type 2 diabetes mellitus with diabetic chronic kidney disease: Secondary | ICD-10-CM | POA: Diagnosis not present

## 2020-10-21 DIAGNOSIS — J9611 Chronic respiratory failure with hypoxia: Secondary | ICD-10-CM | POA: Diagnosis not present

## 2020-10-21 DIAGNOSIS — I5042 Chronic combined systolic (congestive) and diastolic (congestive) heart failure: Secondary | ICD-10-CM | POA: Diagnosis not present

## 2020-10-21 DIAGNOSIS — Z794 Long term (current) use of insulin: Secondary | ICD-10-CM | POA: Diagnosis not present

## 2020-10-21 DIAGNOSIS — M1990 Unspecified osteoarthritis, unspecified site: Secondary | ICD-10-CM | POA: Diagnosis not present

## 2020-10-21 DIAGNOSIS — Z853 Personal history of malignant neoplasm of breast: Secondary | ICD-10-CM | POA: Diagnosis not present

## 2020-10-21 DIAGNOSIS — M791 Myalgia, unspecified site: Secondary | ICD-10-CM | POA: Diagnosis not present

## 2020-10-21 DIAGNOSIS — Z992 Dependence on renal dialysis: Secondary | ICD-10-CM | POA: Diagnosis not present

## 2020-10-21 DIAGNOSIS — Z9181 History of falling: Secondary | ICD-10-CM | POA: Diagnosis not present

## 2020-10-21 DIAGNOSIS — D631 Anemia in chronic kidney disease: Secondary | ICD-10-CM | POA: Diagnosis not present

## 2020-10-21 DIAGNOSIS — Z993 Dependence on wheelchair: Secondary | ICD-10-CM | POA: Diagnosis not present

## 2020-10-21 DIAGNOSIS — G5603 Carpal tunnel syndrome, bilateral upper limbs: Secondary | ICD-10-CM | POA: Diagnosis not present

## 2020-10-21 DIAGNOSIS — N186 End stage renal disease: Secondary | ICD-10-CM | POA: Diagnosis not present

## 2020-10-21 DIAGNOSIS — E1151 Type 2 diabetes mellitus with diabetic peripheral angiopathy without gangrene: Secondary | ICD-10-CM | POA: Diagnosis not present

## 2020-10-21 DIAGNOSIS — H409 Unspecified glaucoma: Secondary | ICD-10-CM | POA: Diagnosis not present

## 2020-10-21 DIAGNOSIS — I132 Hypertensive heart and chronic kidney disease with heart failure and with stage 5 chronic kidney disease, or end stage renal disease: Secondary | ICD-10-CM | POA: Diagnosis not present

## 2020-10-21 DIAGNOSIS — F419 Anxiety disorder, unspecified: Secondary | ICD-10-CM | POA: Diagnosis not present

## 2020-10-21 DIAGNOSIS — E1140 Type 2 diabetes mellitus with diabetic neuropathy, unspecified: Secondary | ICD-10-CM | POA: Diagnosis not present

## 2020-10-21 DIAGNOSIS — J45909 Unspecified asthma, uncomplicated: Secondary | ICD-10-CM | POA: Diagnosis not present

## 2020-10-21 DIAGNOSIS — K219 Gastro-esophageal reflux disease without esophagitis: Secondary | ICD-10-CM | POA: Diagnosis not present

## 2020-10-21 DIAGNOSIS — Z86010 Personal history of colonic polyps: Secondary | ICD-10-CM | POA: Diagnosis not present

## 2020-10-21 DIAGNOSIS — Z89512 Acquired absence of left leg below knee: Secondary | ICD-10-CM | POA: Diagnosis not present

## 2020-10-21 DIAGNOSIS — E46 Unspecified protein-calorie malnutrition: Secondary | ICD-10-CM | POA: Diagnosis not present

## 2020-10-21 DIAGNOSIS — E1122 Type 2 diabetes mellitus with diabetic chronic kidney disease: Secondary | ICD-10-CM | POA: Diagnosis not present

## 2020-10-21 DIAGNOSIS — H548 Legal blindness, as defined in USA: Secondary | ICD-10-CM | POA: Diagnosis not present

## 2020-10-22 DIAGNOSIS — D509 Iron deficiency anemia, unspecified: Secondary | ICD-10-CM | POA: Diagnosis not present

## 2020-10-22 DIAGNOSIS — D631 Anemia in chronic kidney disease: Secondary | ICD-10-CM | POA: Diagnosis not present

## 2020-10-22 DIAGNOSIS — N186 End stage renal disease: Secondary | ICD-10-CM | POA: Diagnosis not present

## 2020-10-22 DIAGNOSIS — Z992 Dependence on renal dialysis: Secondary | ICD-10-CM | POA: Diagnosis not present

## 2020-10-22 DIAGNOSIS — N2581 Secondary hyperparathyroidism of renal origin: Secondary | ICD-10-CM | POA: Diagnosis not present

## 2020-10-24 DIAGNOSIS — D509 Iron deficiency anemia, unspecified: Secondary | ICD-10-CM | POA: Diagnosis not present

## 2020-10-24 DIAGNOSIS — D631 Anemia in chronic kidney disease: Secondary | ICD-10-CM | POA: Diagnosis not present

## 2020-10-24 DIAGNOSIS — N186 End stage renal disease: Secondary | ICD-10-CM | POA: Diagnosis not present

## 2020-10-24 DIAGNOSIS — Z992 Dependence on renal dialysis: Secondary | ICD-10-CM | POA: Diagnosis not present

## 2020-10-24 DIAGNOSIS — N2581 Secondary hyperparathyroidism of renal origin: Secondary | ICD-10-CM | POA: Diagnosis not present

## 2020-10-24 DIAGNOSIS — E1142 Type 2 diabetes mellitus with diabetic polyneuropathy: Secondary | ICD-10-CM | POA: Diagnosis not present

## 2020-10-25 DIAGNOSIS — J9611 Chronic respiratory failure with hypoxia: Secondary | ICD-10-CM | POA: Diagnosis not present

## 2020-10-25 DIAGNOSIS — I5042 Chronic combined systolic (congestive) and diastolic (congestive) heart failure: Secondary | ICD-10-CM | POA: Diagnosis not present

## 2020-10-25 DIAGNOSIS — E1122 Type 2 diabetes mellitus with diabetic chronic kidney disease: Secondary | ICD-10-CM | POA: Diagnosis not present

## 2020-10-25 DIAGNOSIS — N186 End stage renal disease: Secondary | ICD-10-CM | POA: Diagnosis not present

## 2020-10-25 DIAGNOSIS — I132 Hypertensive heart and chronic kidney disease with heart failure and with stage 5 chronic kidney disease, or end stage renal disease: Secondary | ICD-10-CM | POA: Diagnosis not present

## 2020-10-25 DIAGNOSIS — D631 Anemia in chronic kidney disease: Secondary | ICD-10-CM | POA: Diagnosis not present

## 2020-10-26 ENCOUNTER — Other Ambulatory Visit: Payer: Self-pay | Admitting: Internal Medicine

## 2020-10-26 ENCOUNTER — Telehealth: Payer: Self-pay

## 2020-10-26 DIAGNOSIS — N2581 Secondary hyperparathyroidism of renal origin: Secondary | ICD-10-CM | POA: Diagnosis not present

## 2020-10-26 DIAGNOSIS — K769 Liver disease, unspecified: Secondary | ICD-10-CM

## 2020-10-26 DIAGNOSIS — D631 Anemia in chronic kidney disease: Secondary | ICD-10-CM | POA: Diagnosis not present

## 2020-10-26 DIAGNOSIS — D509 Iron deficiency anemia, unspecified: Secondary | ICD-10-CM | POA: Diagnosis not present

## 2020-10-26 DIAGNOSIS — N186 End stage renal disease: Secondary | ICD-10-CM | POA: Diagnosis not present

## 2020-10-26 DIAGNOSIS — Z992 Dependence on renal dialysis: Secondary | ICD-10-CM | POA: Diagnosis not present

## 2020-10-26 NOTE — Telephone Encounter (Signed)
-----   Message from [name], MD sent at 10/23/2020  6:07 PM EDT ----- Regarding: CT versus MRI I want the triple phase abdominal CT scan i.e. liver protocol as we planned cancel the MRI order ----- Message ----- From: [place], Vincenzo Stave E, CMA Sent: 10/22/2020  11:24 AM EDT To: [name], MD  I had sent myself a staff message to set up a CT Abdomin with liver protocol for early May. However when I look in the system patient saw Amy in November and a MRI order has been placed. Please advise CT or MRI needed Sir, thank you.

## 2020-10-26 NOTE — Telephone Encounter (Signed)
I spoke with her sister [name] and she has been informed that a CT abdomen liver protocol-triple phase study has been set up for May 5th at 2:30pm at [facility]. They will come by here for directions/contrast.   I called and spoke to [facility], phone # [phone]. Per her request I am faxing them a note to draw her blood on 11/19/2020 and fax results to [place] to be scanned into epic before her scan. [place] does dialysis Monday, Wednesday, and Friday.

## 2020-10-29 DIAGNOSIS — Z992 Dependence on renal dialysis: Secondary | ICD-10-CM | POA: Diagnosis not present

## 2020-10-29 DIAGNOSIS — D509 Iron deficiency anemia, unspecified: Secondary | ICD-10-CM | POA: Diagnosis not present

## 2020-10-29 DIAGNOSIS — N2581 Secondary hyperparathyroidism of renal origin: Secondary | ICD-10-CM | POA: Diagnosis not present

## 2020-10-29 DIAGNOSIS — N186 End stage renal disease: Secondary | ICD-10-CM | POA: Diagnosis not present

## 2020-10-29 DIAGNOSIS — D631 Anemia in chronic kidney disease: Secondary | ICD-10-CM | POA: Diagnosis not present

## 2020-10-31 DIAGNOSIS — Z992 Dependence on renal dialysis: Secondary | ICD-10-CM | POA: Diagnosis not present

## 2020-10-31 DIAGNOSIS — D631 Anemia in chronic kidney disease: Secondary | ICD-10-CM | POA: Diagnosis not present

## 2020-10-31 DIAGNOSIS — D509 Iron deficiency anemia, unspecified: Secondary | ICD-10-CM | POA: Diagnosis not present

## 2020-10-31 DIAGNOSIS — N2581 Secondary hyperparathyroidism of renal origin: Secondary | ICD-10-CM | POA: Diagnosis not present

## 2020-10-31 DIAGNOSIS — N186 End stage renal disease: Secondary | ICD-10-CM | POA: Diagnosis not present

## 2020-11-01 DIAGNOSIS — I5042 Chronic combined systolic (congestive) and diastolic (congestive) heart failure: Secondary | ICD-10-CM | POA: Diagnosis not present

## 2020-11-01 DIAGNOSIS — D631 Anemia in chronic kidney disease: Secondary | ICD-10-CM | POA: Diagnosis not present

## 2020-11-01 DIAGNOSIS — E1122 Type 2 diabetes mellitus with diabetic chronic kidney disease: Secondary | ICD-10-CM | POA: Diagnosis not present

## 2020-11-01 DIAGNOSIS — I132 Hypertensive heart and chronic kidney disease with heart failure and with stage 5 chronic kidney disease, or end stage renal disease: Secondary | ICD-10-CM | POA: Diagnosis not present

## 2020-11-01 DIAGNOSIS — J9611 Chronic respiratory failure with hypoxia: Secondary | ICD-10-CM | POA: Diagnosis not present

## 2020-11-01 DIAGNOSIS — N186 End stage renal disease: Secondary | ICD-10-CM | POA: Diagnosis not present

## 2020-11-02 DIAGNOSIS — N2581 Secondary hyperparathyroidism of renal origin: Secondary | ICD-10-CM | POA: Diagnosis not present

## 2020-11-02 DIAGNOSIS — Z992 Dependence on renal dialysis: Secondary | ICD-10-CM | POA: Diagnosis not present

## 2020-11-02 DIAGNOSIS — D631 Anemia in chronic kidney disease: Secondary | ICD-10-CM | POA: Diagnosis not present

## 2020-11-02 DIAGNOSIS — N186 End stage renal disease: Secondary | ICD-10-CM | POA: Diagnosis not present

## 2020-11-02 DIAGNOSIS — D509 Iron deficiency anemia, unspecified: Secondary | ICD-10-CM | POA: Diagnosis not present

## 2020-11-05 DIAGNOSIS — D509 Iron deficiency anemia, unspecified: Secondary | ICD-10-CM | POA: Diagnosis not present

## 2020-11-05 DIAGNOSIS — N2581 Secondary hyperparathyroidism of renal origin: Secondary | ICD-10-CM | POA: Diagnosis not present

## 2020-11-05 DIAGNOSIS — N186 End stage renal disease: Secondary | ICD-10-CM | POA: Diagnosis not present

## 2020-11-05 DIAGNOSIS — D631 Anemia in chronic kidney disease: Secondary | ICD-10-CM | POA: Diagnosis not present

## 2020-11-05 DIAGNOSIS — Z992 Dependence on renal dialysis: Secondary | ICD-10-CM | POA: Diagnosis not present

## 2020-11-06 DIAGNOSIS — I5042 Chronic combined systolic (congestive) and diastolic (congestive) heart failure: Secondary | ICD-10-CM | POA: Diagnosis not present

## 2020-11-06 DIAGNOSIS — D631 Anemia in chronic kidney disease: Secondary | ICD-10-CM | POA: Diagnosis not present

## 2020-11-06 DIAGNOSIS — J9611 Chronic respiratory failure with hypoxia: Secondary | ICD-10-CM | POA: Diagnosis not present

## 2020-11-06 DIAGNOSIS — I132 Hypertensive heart and chronic kidney disease with heart failure and with stage 5 chronic kidney disease, or end stage renal disease: Secondary | ICD-10-CM | POA: Diagnosis not present

## 2020-11-06 DIAGNOSIS — E1122 Type 2 diabetes mellitus with diabetic chronic kidney disease: Secondary | ICD-10-CM | POA: Diagnosis not present

## 2020-11-06 DIAGNOSIS — N186 End stage renal disease: Secondary | ICD-10-CM | POA: Diagnosis not present

## 2020-11-07 DIAGNOSIS — N186 End stage renal disease: Secondary | ICD-10-CM | POA: Diagnosis not present

## 2020-11-07 DIAGNOSIS — Z992 Dependence on renal dialysis: Secondary | ICD-10-CM | POA: Diagnosis not present

## 2020-11-07 DIAGNOSIS — D631 Anemia in chronic kidney disease: Secondary | ICD-10-CM | POA: Diagnosis not present

## 2020-11-07 DIAGNOSIS — N2581 Secondary hyperparathyroidism of renal origin: Secondary | ICD-10-CM | POA: Diagnosis not present

## 2020-11-07 DIAGNOSIS — D509 Iron deficiency anemia, unspecified: Secondary | ICD-10-CM | POA: Diagnosis not present

## 2020-11-09 DIAGNOSIS — D509 Iron deficiency anemia, unspecified: Secondary | ICD-10-CM | POA: Diagnosis not present

## 2020-11-09 DIAGNOSIS — Z992 Dependence on renal dialysis: Secondary | ICD-10-CM | POA: Diagnosis not present

## 2020-11-09 DIAGNOSIS — N2581 Secondary hyperparathyroidism of renal origin: Secondary | ICD-10-CM | POA: Diagnosis not present

## 2020-11-09 DIAGNOSIS — N186 End stage renal disease: Secondary | ICD-10-CM | POA: Diagnosis not present

## 2020-11-09 DIAGNOSIS — D631 Anemia in chronic kidney disease: Secondary | ICD-10-CM | POA: Diagnosis not present

## 2020-11-12 DIAGNOSIS — D509 Iron deficiency anemia, unspecified: Secondary | ICD-10-CM | POA: Diagnosis not present

## 2020-11-12 DIAGNOSIS — D631 Anemia in chronic kidney disease: Secondary | ICD-10-CM | POA: Diagnosis not present

## 2020-11-12 DIAGNOSIS — Z992 Dependence on renal dialysis: Secondary | ICD-10-CM | POA: Diagnosis not present

## 2020-11-12 DIAGNOSIS — N2581 Secondary hyperparathyroidism of renal origin: Secondary | ICD-10-CM | POA: Diagnosis not present

## 2020-11-12 DIAGNOSIS — N186 End stage renal disease: Secondary | ICD-10-CM | POA: Diagnosis not present

## 2020-11-14 DIAGNOSIS — D509 Iron deficiency anemia, unspecified: Secondary | ICD-10-CM | POA: Diagnosis not present

## 2020-11-14 DIAGNOSIS — Z992 Dependence on renal dialysis: Secondary | ICD-10-CM | POA: Diagnosis not present

## 2020-11-14 DIAGNOSIS — D631 Anemia in chronic kidney disease: Secondary | ICD-10-CM | POA: Diagnosis not present

## 2020-11-14 DIAGNOSIS — N2581 Secondary hyperparathyroidism of renal origin: Secondary | ICD-10-CM | POA: Diagnosis not present

## 2020-11-14 DIAGNOSIS — N186 End stage renal disease: Secondary | ICD-10-CM | POA: Diagnosis not present

## 2020-11-15 DIAGNOSIS — J9611 Chronic respiratory failure with hypoxia: Secondary | ICD-10-CM | POA: Diagnosis not present

## 2020-11-15 DIAGNOSIS — D631 Anemia in chronic kidney disease: Secondary | ICD-10-CM | POA: Diagnosis not present

## 2020-11-15 DIAGNOSIS — I5042 Chronic combined systolic (congestive) and diastolic (congestive) heart failure: Secondary | ICD-10-CM | POA: Diagnosis not present

## 2020-11-15 DIAGNOSIS — I132 Hypertensive heart and chronic kidney disease with heart failure and with stage 5 chronic kidney disease, or end stage renal disease: Secondary | ICD-10-CM | POA: Diagnosis not present

## 2020-11-15 DIAGNOSIS — E1122 Type 2 diabetes mellitus with diabetic chronic kidney disease: Secondary | ICD-10-CM | POA: Diagnosis not present

## 2020-11-15 DIAGNOSIS — N186 End stage renal disease: Secondary | ICD-10-CM | POA: Diagnosis not present

## 2020-11-16 DIAGNOSIS — N186 End stage renal disease: Secondary | ICD-10-CM | POA: Diagnosis not present

## 2020-11-16 DIAGNOSIS — Z992 Dependence on renal dialysis: Secondary | ICD-10-CM | POA: Diagnosis not present

## 2020-11-16 DIAGNOSIS — D509 Iron deficiency anemia, unspecified: Secondary | ICD-10-CM | POA: Diagnosis not present

## 2020-11-16 DIAGNOSIS — D631 Anemia in chronic kidney disease: Secondary | ICD-10-CM | POA: Diagnosis not present

## 2020-11-16 DIAGNOSIS — N2581 Secondary hyperparathyroidism of renal origin: Secondary | ICD-10-CM | POA: Diagnosis not present

## 2020-11-18 DIAGNOSIS — Z992 Dependence on renal dialysis: Secondary | ICD-10-CM | POA: Diagnosis not present

## 2020-11-18 DIAGNOSIS — E1129 Type 2 diabetes mellitus with other diabetic kidney complication: Secondary | ICD-10-CM | POA: Diagnosis not present

## 2020-11-18 DIAGNOSIS — N186 End stage renal disease: Secondary | ICD-10-CM | POA: Diagnosis not present

## 2020-11-19 DIAGNOSIS — N2581 Secondary hyperparathyroidism of renal origin: Secondary | ICD-10-CM | POA: Diagnosis not present

## 2020-11-19 DIAGNOSIS — D631 Anemia in chronic kidney disease: Secondary | ICD-10-CM | POA: Diagnosis not present

## 2020-11-19 DIAGNOSIS — N186 End stage renal disease: Secondary | ICD-10-CM | POA: Diagnosis not present

## 2020-11-19 DIAGNOSIS — Z992 Dependence on renal dialysis: Secondary | ICD-10-CM | POA: Diagnosis not present

## 2020-11-19 DIAGNOSIS — D509 Iron deficiency anemia, unspecified: Secondary | ICD-10-CM | POA: Diagnosis not present

## 2020-11-20 DIAGNOSIS — J9611 Chronic respiratory failure with hypoxia: Secondary | ICD-10-CM | POA: Diagnosis not present

## 2020-11-20 DIAGNOSIS — K769 Liver disease, unspecified: Secondary | ICD-10-CM | POA: Diagnosis not present

## 2020-11-20 DIAGNOSIS — K922 Gastrointestinal hemorrhage, unspecified: Secondary | ICD-10-CM | POA: Diagnosis not present

## 2020-11-20 DIAGNOSIS — H409 Unspecified glaucoma: Secondary | ICD-10-CM | POA: Diagnosis not present

## 2020-11-20 DIAGNOSIS — H548 Legal blindness, as defined in USA: Secondary | ICD-10-CM | POA: Diagnosis not present

## 2020-11-20 DIAGNOSIS — Z993 Dependence on wheelchair: Secondary | ICD-10-CM | POA: Diagnosis not present

## 2020-11-20 DIAGNOSIS — M791 Myalgia, unspecified site: Secondary | ICD-10-CM | POA: Diagnosis not present

## 2020-11-20 DIAGNOSIS — E1129 Type 2 diabetes mellitus with other diabetic kidney complication: Secondary | ICD-10-CM | POA: Diagnosis not present

## 2020-11-20 DIAGNOSIS — N186 End stage renal disease: Secondary | ICD-10-CM | POA: Diagnosis not present

## 2020-11-20 DIAGNOSIS — K219 Gastro-esophageal reflux disease without esophagitis: Secondary | ICD-10-CM | POA: Diagnosis not present

## 2020-11-20 DIAGNOSIS — I132 Hypertensive heart and chronic kidney disease with heart failure and with stage 5 chronic kidney disease, or end stage renal disease: Secondary | ICD-10-CM | POA: Diagnosis not present

## 2020-11-20 DIAGNOSIS — Z794 Long term (current) use of insulin: Secondary | ICD-10-CM | POA: Diagnosis not present

## 2020-11-20 DIAGNOSIS — Z86010 Personal history of colonic polyps: Secondary | ICD-10-CM | POA: Diagnosis not present

## 2020-11-20 DIAGNOSIS — J45909 Unspecified asthma, uncomplicated: Secondary | ICD-10-CM | POA: Diagnosis not present

## 2020-11-20 DIAGNOSIS — Z9181 History of falling: Secondary | ICD-10-CM | POA: Diagnosis not present

## 2020-11-20 DIAGNOSIS — E1122 Type 2 diabetes mellitus with diabetic chronic kidney disease: Secondary | ICD-10-CM | POA: Diagnosis not present

## 2020-11-20 DIAGNOSIS — E46 Unspecified protein-calorie malnutrition: Secondary | ICD-10-CM | POA: Diagnosis not present

## 2020-11-20 DIAGNOSIS — Z89512 Acquired absence of left leg below knee: Secondary | ICD-10-CM | POA: Diagnosis not present

## 2020-11-20 DIAGNOSIS — M1990 Unspecified osteoarthritis, unspecified site: Secondary | ICD-10-CM | POA: Diagnosis not present

## 2020-11-20 DIAGNOSIS — I509 Heart failure, unspecified: Secondary | ICD-10-CM | POA: Diagnosis not present

## 2020-11-20 DIAGNOSIS — D62 Acute posthemorrhagic anemia: Secondary | ICD-10-CM | POA: Diagnosis not present

## 2020-11-20 DIAGNOSIS — I5042 Chronic combined systolic (congestive) and diastolic (congestive) heart failure: Secondary | ICD-10-CM | POA: Diagnosis not present

## 2020-11-20 DIAGNOSIS — I739 Peripheral vascular disease, unspecified: Secondary | ICD-10-CM | POA: Diagnosis not present

## 2020-11-20 DIAGNOSIS — E1151 Type 2 diabetes mellitus with diabetic peripheral angiopathy without gangrene: Secondary | ICD-10-CM | POA: Diagnosis not present

## 2020-11-20 DIAGNOSIS — Z89611 Acquired absence of right leg above knee: Secondary | ICD-10-CM | POA: Diagnosis not present

## 2020-11-20 DIAGNOSIS — Z853 Personal history of malignant neoplasm of breast: Secondary | ICD-10-CM | POA: Diagnosis not present

## 2020-11-20 DIAGNOSIS — D631 Anemia in chronic kidney disease: Secondary | ICD-10-CM | POA: Diagnosis not present

## 2020-11-20 DIAGNOSIS — F419 Anxiety disorder, unspecified: Secondary | ICD-10-CM | POA: Diagnosis not present

## 2020-11-20 DIAGNOSIS — E1140 Type 2 diabetes mellitus with diabetic neuropathy, unspecified: Secondary | ICD-10-CM | POA: Diagnosis not present

## 2020-11-20 DIAGNOSIS — G5603 Carpal tunnel syndrome, bilateral upper limbs: Secondary | ICD-10-CM | POA: Diagnosis not present

## 2020-11-20 DIAGNOSIS — Z992 Dependence on renal dialysis: Secondary | ICD-10-CM | POA: Diagnosis not present

## 2020-11-21 ENCOUNTER — Other Ambulatory Visit (HOSPITAL_COMMUNITY): Payer: Self-pay | Admitting: Physician Assistant

## 2020-11-21 DIAGNOSIS — D631 Anemia in chronic kidney disease: Secondary | ICD-10-CM | POA: Diagnosis not present

## 2020-11-21 DIAGNOSIS — N186 End stage renal disease: Secondary | ICD-10-CM | POA: Diagnosis not present

## 2020-11-21 DIAGNOSIS — N2581 Secondary hyperparathyroidism of renal origin: Secondary | ICD-10-CM | POA: Diagnosis not present

## 2020-11-21 DIAGNOSIS — D509 Iron deficiency anemia, unspecified: Secondary | ICD-10-CM | POA: Diagnosis not present

## 2020-11-21 DIAGNOSIS — Z992 Dependence on renal dialysis: Secondary | ICD-10-CM | POA: Diagnosis not present

## 2020-11-22 ENCOUNTER — Ambulatory Visit (HOSPITAL_COMMUNITY)
Admission: RE | Admit: 2020-11-22 | Discharge: 2020-11-22 | Disposition: A | Discharge status: Home / Self Care | Payer: Medicare Other | Source: Ambulatory Visit | Attending: Internal Medicine | Admitting: Internal Medicine

## 2020-11-22 ENCOUNTER — Other Ambulatory Visit: Payer: Self-pay

## 2020-11-22 ENCOUNTER — Telehealth: Payer: Self-pay | Admitting: Internal Medicine

## 2020-11-22 ENCOUNTER — Encounter (HOSPITAL_COMMUNITY): Payer: Self-pay

## 2020-11-22 ENCOUNTER — Other Ambulatory Visit: Payer: Self-pay | Admitting: *Deleted

## 2020-11-22 DIAGNOSIS — K769 Liver disease, unspecified: Secondary | ICD-10-CM

## 2020-11-22 MED ORDER — SODIUM CHLORIDE (PF) 0.9 % IJ SOLN
INTRAMUSCULAR | Status: AC
  Filled 2020-11-22: qty 50

## 2020-11-22 MED ORDER — IOPAMIDOL (ISOVUE-300) INJECTION 61%: 100.0000 mL | Freq: Once | INTRAVENOUS | Status: DC | PRN

## 2020-11-22 MED ORDER — IOHEXOL 300 MG/ML  SOLN
100.0000 mL | Freq: Once | INTRAMUSCULAR | Status: AC | PRN
  Administered 2020-11-22: 100 mL via INTRAVENOUS

## 2020-11-22 NOTE — Telephone Encounter (Addendum)
Has retained colonic contrast and will need purge and KUB  I was notified by the CT tech.  There is contrast near the liver so we need to clear that.  I assume this is from her CT scan in January?  At any rate please have her do a MiraLAX purge and then come for a KUB regarding this problem.  Evaluate for retained contrast after purge would be what I would write in the indications and I suppose you could use a liver lesion for the diagnosis

## 2020-11-22 NOTE — Patient Outreach (Signed)
[facility]) Care Management  11/22/2020  [name] May 16, 1951 [id number]  Unsuccessful outreach attempt made to patient/sister. Patient's sister answered the phone and stated that the patient had an imaging study today that they were getting ready to go to and would not be able to speak today. Patient's sister did request that this nurse call back at a later date.   Plan: RN Health Coach will call patient within the month of June.  Emelia Loron RN, BSN [place] [phone] [email or web address]

## 2020-11-23 DIAGNOSIS — N2581 Secondary hyperparathyroidism of renal origin: Secondary | ICD-10-CM | POA: Diagnosis not present

## 2020-11-23 DIAGNOSIS — D509 Iron deficiency anemia, unspecified: Secondary | ICD-10-CM | POA: Diagnosis not present

## 2020-11-23 DIAGNOSIS — Z992 Dependence on renal dialysis: Secondary | ICD-10-CM | POA: Diagnosis not present

## 2020-11-23 DIAGNOSIS — D631 Anemia in chronic kidney disease: Secondary | ICD-10-CM | POA: Diagnosis not present

## 2020-11-23 DIAGNOSIS — N186 End stage renal disease: Secondary | ICD-10-CM | POA: Diagnosis not present

## 2020-11-23 NOTE — Telephone Encounter (Signed)
I spoke with [name], patient's sister.  She will bring her for KUB on Tuesday.  She verbalized instructions for Miralax Purge

## 2020-11-27 ENCOUNTER — Ambulatory Visit (INDEPENDENT_AMBULATORY_CARE_PROVIDER_SITE_OTHER)
Admission: RE | Admit: 2020-11-27 | Discharge: 2020-11-27 | Disposition: A | Discharge status: Home / Self Care | Payer: Medicare Other | Source: Ambulatory Visit | Attending: Internal Medicine | Admitting: Internal Medicine

## 2020-11-27 ENCOUNTER — Other Ambulatory Visit: Payer: Self-pay

## 2020-11-27 DIAGNOSIS — R109 Unspecified abdominal pain: Secondary | ICD-10-CM | POA: Diagnosis not present

## 2020-11-27 DIAGNOSIS — K769 Liver disease, unspecified: Secondary | ICD-10-CM

## 2020-11-28 DIAGNOSIS — Z992 Dependence on renal dialysis: Secondary | ICD-10-CM | POA: Diagnosis not present

## 2020-11-28 DIAGNOSIS — N186 End stage renal disease: Secondary | ICD-10-CM | POA: Diagnosis not present

## 2020-11-28 DIAGNOSIS — D631 Anemia in chronic kidney disease: Secondary | ICD-10-CM | POA: Diagnosis not present

## 2020-11-28 DIAGNOSIS — N2581 Secondary hyperparathyroidism of renal origin: Secondary | ICD-10-CM | POA: Diagnosis not present

## 2020-11-28 DIAGNOSIS — D509 Iron deficiency anemia, unspecified: Secondary | ICD-10-CM | POA: Diagnosis not present

## 2020-11-30 DIAGNOSIS — N2581 Secondary hyperparathyroidism of renal origin: Secondary | ICD-10-CM | POA: Diagnosis not present

## 2020-11-30 DIAGNOSIS — N186 End stage renal disease: Secondary | ICD-10-CM | POA: Diagnosis not present

## 2020-11-30 DIAGNOSIS — D509 Iron deficiency anemia, unspecified: Secondary | ICD-10-CM | POA: Diagnosis not present

## 2020-11-30 DIAGNOSIS — Z992 Dependence on renal dialysis: Secondary | ICD-10-CM | POA: Diagnosis not present

## 2020-11-30 DIAGNOSIS — D631 Anemia in chronic kidney disease: Secondary | ICD-10-CM | POA: Diagnosis not present

## 2020-12-01 ENCOUNTER — Other Ambulatory Visit: Payer: Self-pay | Admitting: Vascular Surgery

## 2020-12-03 DIAGNOSIS — Z992 Dependence on renal dialysis: Secondary | ICD-10-CM | POA: Diagnosis not present

## 2020-12-03 DIAGNOSIS — N2581 Secondary hyperparathyroidism of renal origin: Secondary | ICD-10-CM | POA: Diagnosis not present

## 2020-12-03 DIAGNOSIS — D509 Iron deficiency anemia, unspecified: Secondary | ICD-10-CM | POA: Diagnosis not present

## 2020-12-03 DIAGNOSIS — N186 End stage renal disease: Secondary | ICD-10-CM | POA: Diagnosis not present

## 2020-12-03 DIAGNOSIS — D631 Anemia in chronic kidney disease: Secondary | ICD-10-CM | POA: Diagnosis not present

## 2020-12-05 DIAGNOSIS — D509 Iron deficiency anemia, unspecified: Secondary | ICD-10-CM | POA: Diagnosis not present

## 2020-12-05 DIAGNOSIS — D631 Anemia in chronic kidney disease: Secondary | ICD-10-CM | POA: Diagnosis not present

## 2020-12-05 DIAGNOSIS — N186 End stage renal disease: Secondary | ICD-10-CM | POA: Diagnosis not present

## 2020-12-05 DIAGNOSIS — Z992 Dependence on renal dialysis: Secondary | ICD-10-CM | POA: Diagnosis not present

## 2020-12-05 DIAGNOSIS — N2581 Secondary hyperparathyroidism of renal origin: Secondary | ICD-10-CM | POA: Diagnosis not present

## 2020-12-07 DIAGNOSIS — Z992 Dependence on renal dialysis: Secondary | ICD-10-CM | POA: Diagnosis not present

## 2020-12-07 DIAGNOSIS — N2581 Secondary hyperparathyroidism of renal origin: Secondary | ICD-10-CM | POA: Diagnosis not present

## 2020-12-07 DIAGNOSIS — N186 End stage renal disease: Secondary | ICD-10-CM | POA: Diagnosis not present

## 2020-12-07 DIAGNOSIS — D509 Iron deficiency anemia, unspecified: Secondary | ICD-10-CM | POA: Diagnosis not present

## 2020-12-07 DIAGNOSIS — D631 Anemia in chronic kidney disease: Secondary | ICD-10-CM | POA: Diagnosis not present

## 2020-12-10 DIAGNOSIS — N2581 Secondary hyperparathyroidism of renal origin: Secondary | ICD-10-CM | POA: Diagnosis not present

## 2020-12-10 DIAGNOSIS — Z992 Dependence on renal dialysis: Secondary | ICD-10-CM | POA: Diagnosis not present

## 2020-12-10 DIAGNOSIS — D509 Iron deficiency anemia, unspecified: Secondary | ICD-10-CM | POA: Diagnosis not present

## 2020-12-10 DIAGNOSIS — N186 End stage renal disease: Secondary | ICD-10-CM | POA: Diagnosis not present

## 2020-12-10 DIAGNOSIS — D631 Anemia in chronic kidney disease: Secondary | ICD-10-CM | POA: Diagnosis not present

## 2020-12-12 DIAGNOSIS — Z992 Dependence on renal dialysis: Secondary | ICD-10-CM | POA: Diagnosis not present

## 2020-12-12 DIAGNOSIS — N2581 Secondary hyperparathyroidism of renal origin: Secondary | ICD-10-CM | POA: Diagnosis not present

## 2020-12-12 DIAGNOSIS — N186 End stage renal disease: Secondary | ICD-10-CM | POA: Diagnosis not present

## 2020-12-12 DIAGNOSIS — D631 Anemia in chronic kidney disease: Secondary | ICD-10-CM | POA: Diagnosis not present

## 2020-12-12 DIAGNOSIS — D509 Iron deficiency anemia, unspecified: Secondary | ICD-10-CM | POA: Diagnosis not present

## 2020-12-14 DIAGNOSIS — N186 End stage renal disease: Secondary | ICD-10-CM | POA: Diagnosis not present

## 2020-12-14 DIAGNOSIS — N2581 Secondary hyperparathyroidism of renal origin: Secondary | ICD-10-CM | POA: Diagnosis not present

## 2020-12-14 DIAGNOSIS — D509 Iron deficiency anemia, unspecified: Secondary | ICD-10-CM | POA: Diagnosis not present

## 2020-12-14 DIAGNOSIS — Z992 Dependence on renal dialysis: Secondary | ICD-10-CM | POA: Diagnosis not present

## 2020-12-14 DIAGNOSIS — D631 Anemia in chronic kidney disease: Secondary | ICD-10-CM | POA: Diagnosis not present

## 2020-12-17 DIAGNOSIS — N186 End stage renal disease: Secondary | ICD-10-CM | POA: Diagnosis not present

## 2020-12-17 DIAGNOSIS — Z992 Dependence on renal dialysis: Secondary | ICD-10-CM | POA: Diagnosis not present

## 2020-12-17 DIAGNOSIS — D631 Anemia in chronic kidney disease: Secondary | ICD-10-CM | POA: Diagnosis not present

## 2020-12-17 DIAGNOSIS — N2581 Secondary hyperparathyroidism of renal origin: Secondary | ICD-10-CM | POA: Diagnosis not present

## 2020-12-17 DIAGNOSIS — D509 Iron deficiency anemia, unspecified: Secondary | ICD-10-CM | POA: Diagnosis not present

## 2020-12-19 DIAGNOSIS — E1129 Type 2 diabetes mellitus with other diabetic kidney complication: Secondary | ICD-10-CM | POA: Diagnosis not present

## 2020-12-19 DIAGNOSIS — D631 Anemia in chronic kidney disease: Secondary | ICD-10-CM | POA: Diagnosis not present

## 2020-12-19 DIAGNOSIS — Z992 Dependence on renal dialysis: Secondary | ICD-10-CM | POA: Diagnosis not present

## 2020-12-19 DIAGNOSIS — N2581 Secondary hyperparathyroidism of renal origin: Secondary | ICD-10-CM | POA: Diagnosis not present

## 2020-12-19 DIAGNOSIS — I120 Hypertensive chronic kidney disease with stage 5 chronic kidney disease or end stage renal disease: Secondary | ICD-10-CM | POA: Diagnosis not present

## 2020-12-19 DIAGNOSIS — S72001A Fracture of unspecified part of neck of right femur, initial encounter for closed fracture: Secondary | ICD-10-CM | POA: Diagnosis not present

## 2020-12-19 DIAGNOSIS — N186 End stage renal disease: Secondary | ICD-10-CM | POA: Diagnosis not present

## 2020-12-19 DIAGNOSIS — D509 Iron deficiency anemia, unspecified: Secondary | ICD-10-CM | POA: Diagnosis not present

## 2020-12-20 ENCOUNTER — Ambulatory Visit: Payer: Self-pay | Admitting: *Deleted

## 2020-12-22 ENCOUNTER — Other Ambulatory Visit: Payer: Self-pay

## 2020-12-22 ENCOUNTER — Emergency Department (HOSPITAL_COMMUNITY): Payer: Medicare Other

## 2020-12-22 ENCOUNTER — Encounter (HOSPITAL_COMMUNITY): Payer: Self-pay

## 2020-12-22 ENCOUNTER — Inpatient Hospital Stay (HOSPITAL_COMMUNITY)
Admission: EM | Admit: 2020-12-22 | Discharge: 2020-12-27 | DRG: 480 | Disposition: A | Discharge status: Home Health | Payer: Medicare Other | Attending: Family Medicine | Admitting: Family Medicine

## 2020-12-22 DIAGNOSIS — Z886 Allergy status to analgesic agent status: Secondary | ICD-10-CM

## 2020-12-22 DIAGNOSIS — Z885 Allergy status to narcotic agent status: Secondary | ICD-10-CM | POA: Diagnosis not present

## 2020-12-22 DIAGNOSIS — E8889 Other specified metabolic disorders: Secondary | ICD-10-CM | POA: Diagnosis present

## 2020-12-22 DIAGNOSIS — Z794 Long term (current) use of insulin: Secondary | ICD-10-CM | POA: Diagnosis not present

## 2020-12-22 DIAGNOSIS — W050XXA Fall from non-moving wheelchair, initial encounter: Secondary | ICD-10-CM | POA: Diagnosis present

## 2020-12-22 DIAGNOSIS — S72141A Displaced intertrochanteric fracture of right femur, initial encounter for closed fracture: Principal | ICD-10-CM | POA: Diagnosis present

## 2020-12-22 DIAGNOSIS — N186 End stage renal disease: Secondary | ICD-10-CM | POA: Diagnosis present

## 2020-12-22 DIAGNOSIS — Z833 Family history of diabetes mellitus: Secondary | ICD-10-CM

## 2020-12-22 DIAGNOSIS — I959 Hypotension, unspecified: Secondary | ICD-10-CM | POA: Diagnosis not present

## 2020-12-22 DIAGNOSIS — H543 Unqualified visual loss, both eyes: Secondary | ICD-10-CM | POA: Diagnosis present

## 2020-12-22 DIAGNOSIS — I509 Heart failure, unspecified: Secondary | ICD-10-CM | POA: Diagnosis not present

## 2020-12-22 DIAGNOSIS — E785 Hyperlipidemia, unspecified: Secondary | ICD-10-CM | POA: Diagnosis present

## 2020-12-22 DIAGNOSIS — Z8249 Family history of ischemic heart disease and other diseases of the circulatory system: Secondary | ICD-10-CM

## 2020-12-22 DIAGNOSIS — E1129 Type 2 diabetes mellitus with other diabetic kidney complication: Secondary | ICD-10-CM | POA: Diagnosis present

## 2020-12-22 DIAGNOSIS — Z992 Dependence on renal dialysis: Secondary | ICD-10-CM

## 2020-12-22 DIAGNOSIS — E1165 Type 2 diabetes mellitus with hyperglycemia: Secondary | ICD-10-CM | POA: Diagnosis present

## 2020-12-22 DIAGNOSIS — J45909 Unspecified asthma, uncomplicated: Secondary | ICD-10-CM | POA: Diagnosis present

## 2020-12-22 DIAGNOSIS — E1151 Type 2 diabetes mellitus with diabetic peripheral angiopathy without gangrene: Secondary | ICD-10-CM | POA: Diagnosis not present

## 2020-12-22 DIAGNOSIS — Z89611 Acquired absence of right leg above knee: Secondary | ICD-10-CM | POA: Diagnosis not present

## 2020-12-22 DIAGNOSIS — Z20822 Contact with and (suspected) exposure to covid-19: Secondary | ICD-10-CM | POA: Diagnosis present

## 2020-12-22 DIAGNOSIS — E1122 Type 2 diabetes mellitus with diabetic chronic kidney disease: Secondary | ICD-10-CM | POA: Diagnosis not present

## 2020-12-22 DIAGNOSIS — S72001A Fracture of unspecified part of neck of right femur, initial encounter for closed fracture: Secondary | ICD-10-CM | POA: Diagnosis present

## 2020-12-22 DIAGNOSIS — E1139 Type 2 diabetes mellitus with other diabetic ophthalmic complication: Secondary | ICD-10-CM | POA: Diagnosis present

## 2020-12-22 DIAGNOSIS — D631 Anemia in chronic kidney disease: Secondary | ICD-10-CM | POA: Diagnosis present

## 2020-12-22 DIAGNOSIS — Z853 Personal history of malignant neoplasm of breast: Secondary | ICD-10-CM | POA: Diagnosis not present

## 2020-12-22 DIAGNOSIS — R001 Bradycardia, unspecified: Secondary | ICD-10-CM | POA: Diagnosis not present

## 2020-12-22 DIAGNOSIS — K219 Gastro-esophageal reflux disease without esophagitis: Secondary | ICD-10-CM | POA: Diagnosis present

## 2020-12-22 DIAGNOSIS — Z79899 Other long term (current) drug therapy: Secondary | ICD-10-CM

## 2020-12-22 DIAGNOSIS — I132 Hypertensive heart and chronic kidney disease with heart failure and with stage 5 chronic kidney disease, or end stage renal disease: Secondary | ICD-10-CM | POA: Diagnosis not present

## 2020-12-22 DIAGNOSIS — Z83438 Family history of other disorder of lipoprotein metabolism and other lipidemia: Secondary | ICD-10-CM

## 2020-12-22 DIAGNOSIS — Z841 Family history of disorders of kidney and ureter: Secondary | ICD-10-CM

## 2020-12-22 DIAGNOSIS — Y92009 Unspecified place in unspecified non-institutional (private) residence as the place of occurrence of the external cause: Secondary | ICD-10-CM | POA: Diagnosis not present

## 2020-12-22 DIAGNOSIS — Z91040 Latex allergy status: Secondary | ICD-10-CM | POA: Diagnosis not present

## 2020-12-22 DIAGNOSIS — I5042 Chronic combined systolic (congestive) and diastolic (congestive) heart failure: Secondary | ICD-10-CM | POA: Diagnosis present

## 2020-12-22 DIAGNOSIS — Z91048 Other nonmedicinal substance allergy status: Secondary | ICD-10-CM

## 2020-12-22 DIAGNOSIS — W19XXXA Unspecified fall, initial encounter: Secondary | ICD-10-CM | POA: Diagnosis not present

## 2020-12-22 DIAGNOSIS — Z419 Encounter for procedure for purposes other than remedying health state, unspecified: Secondary | ICD-10-CM

## 2020-12-22 DIAGNOSIS — E1140 Type 2 diabetes mellitus with diabetic neuropathy, unspecified: Secondary | ICD-10-CM | POA: Diagnosis not present

## 2020-12-22 DIAGNOSIS — H42 Glaucoma in diseases classified elsewhere: Secondary | ICD-10-CM | POA: Diagnosis present

## 2020-12-22 DIAGNOSIS — T148XXA Other injury of unspecified body region, initial encounter: Secondary | ICD-10-CM

## 2020-12-22 DIAGNOSIS — M25551 Pain in right hip: Secondary | ICD-10-CM | POA: Diagnosis not present

## 2020-12-22 DIAGNOSIS — R600 Localized edema: Secondary | ICD-10-CM | POA: Diagnosis not present

## 2020-12-22 DIAGNOSIS — Z89612 Acquired absence of left leg above knee: Secondary | ICD-10-CM

## 2020-12-22 DIAGNOSIS — S72111A Displaced fracture of greater trochanter of right femur, initial encounter for closed fracture: Secondary | ICD-10-CM | POA: Diagnosis not present

## 2020-12-22 DIAGNOSIS — Z7902 Long term (current) use of antithrombotics/antiplatelets: Secondary | ICD-10-CM

## 2020-12-22 DIAGNOSIS — N250 Renal osteodystrophy: Secondary | ICD-10-CM | POA: Diagnosis not present

## 2020-12-22 DIAGNOSIS — M7989 Other specified soft tissue disorders: Secondary | ICD-10-CM | POA: Diagnosis not present

## 2020-12-22 LAB — CBC WITH DIFFERENTIAL/PLATELET
Abs Immature Granulocytes: 0.04 10*3/uL (ref 0.00–0.07)
Basophils Absolute: 0.1 10*3/uL (ref 0.0–0.1)
Basophils Relative: 1 %
Eosinophils Absolute: 0.1 10*3/uL (ref 0.0–0.5)
Eosinophils Relative: 1 %
HCT: 30.8 % — ABNORMAL LOW (ref 36.0–46.0)
Hemoglobin: 9.7 g/dL — ABNORMAL LOW (ref 12.0–15.0)
Immature Granulocytes: 0 %
Lymphocytes Relative: 16 %
Lymphs Abs: 1.5 10*3/uL (ref 0.7–4.0)
MCH: 31.3 pg (ref 26.0–34.0)
MCHC: 31.5 g/dL (ref 30.0–36.0)
MCV: 99.4 fL (ref 80.0–100.0)
Monocytes Absolute: 0.7 10*3/uL (ref 0.1–1.0)
Monocytes Relative: 7 %
Neutro Abs: 7.3 10*3/uL (ref 1.7–7.7)
Neutrophils Relative %: 75 %
Platelets: 235 10*3/uL (ref 150–400)
RBC: 3.1 MIL/uL — ABNORMAL LOW (ref 3.87–5.11)
RDW: 20.6 % — ABNORMAL HIGH (ref 11.5–15.5)
WBC: 9.8 10*3/uL (ref 4.0–10.5)
nRBC: 0 % (ref 0.0–0.2)

## 2020-12-22 LAB — BASIC METABOLIC PANEL
Anion gap: 14 (ref 5–15)
BUN: 24 mg/dL — ABNORMAL HIGH (ref 8–23)
CO2: 27 mmol/L (ref 22–32)
Calcium: 9.1 mg/dL (ref 8.9–10.3)
Chloride: 96 mmol/L — ABNORMAL LOW (ref 98–111)
Creatinine, Ser: 8.41 mg/dL — ABNORMAL HIGH (ref 0.44–1.00)
GFR, Estimated: 5 mL/min — ABNORMAL LOW (ref >60)
Glucose, Bld: 108 mg/dL — ABNORMAL HIGH (ref 70–99)
Potassium: 3.9 mmol/L (ref 3.5–5.1)
Sodium: 137 mmol/L (ref 135–145)

## 2020-12-22 MED ORDER — FENTANYL CITRATE (PF) 100 MCG/2ML IJ SOLN
50.0000 ug | Freq: Once | INTRAMUSCULAR | Status: AC
  Administered 2020-12-22: 50 ug via INTRAVENOUS
  Filled 2020-12-22: qty 2

## 2020-12-22 NOTE — ED Triage Notes (Signed)
Pt brought in by EMS after falling out of her wheel chair. Pt c/o right hip pain.

## 2020-12-22 NOTE — ED Provider Notes (Signed)
Admitted to hospitalist Dr Beryle Quant.  Patient stable at this time - no emergent indication for dialysis.   Wyvonnia Dusky, MD 12/22/20 [phone]

## 2020-12-22 NOTE — ED Provider Notes (Signed)
[facility] EMERGENCY DEPARTMENT Provider Note   CSN: [id number] Arrival date & time: 12/22/20  2055     History Chief Complaint  Patient presents with  . Hip Pain    [name] is a 70 y.o. female.   Hip Pain This is a new problem. The current episode started 1 to 2 hours ago. The problem occurs constantly. The problem has not changed since onset.Pertinent negatives include no chest pain, no abdominal pain, no headaches and no shortness of breath. The symptoms are aggravated by twisting and bending (Movement, moving her RLE). Nothing relieves the symptoms. She has tried nothing for the symptoms.   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 Brought to the emergency department via EMS after she accidentally slipped out of her wheelchair landing on her right hip causing immediate pain. Did not hit her head.  No LOC.  Complaining of right hip and pelvis pain.     Past Medical History:  Diagnosis Date  . Anemia   . Anemia   . Anxiety   . Arthritis    "joints" (06/15/2013)  . Asthma   . 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 Blood transfusion without reported diagnosis   . Breast cancer ([place])    left  . CHF (congestive heart failure) ([place])   . 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 Esophageal ulcer with bleeding   . ESRD (end stage renal disease) ([place])    "suppose to start dialysis today" (06/15/2013)  . Family history of adverse reaction to anesthesia    It took a while for pt sister to wake from anesthesia  . GERD (gastroesophageal reflux disease)   . GI bleed   . Glaucoma    blind in both eyes  . Heart murmur    Mild AS, moderate MR, moderate TR 10/30/16 echo  . Hx of adenomatous colonic polyps 04/07/2018  .  Hypertension   . Myalgia 12/31/2011  . Neuropathy 12/31/2011  . PAD (peripheral artery disease) (HCC)    nonviable tissue left lower extremity  . Shortness of breath    "when she doesn't go to dialysis"  . Type II diabetes mellitus ([place])    Type II    Patient Active Problem List   Diagnosis Date Noted  . Closed right hip fracture ([place]) 12/22/2020  . Benign neoplasm of colon   . GI bleed 05/23/2020  . Upper GI bleed 05/22/2020  . Angiodysplasia of intestine with hemorrhage 01/24/2020  . Acute blood loss anemia 12/16/2019  . Hypotension 12/16/2019  . 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[place]) 11/17/2019  . Infection of amputation stump, left lower extremity ([place]) 10/19/2019  . Peripheral artery disease ([place]) 09/30/2019  . Gangrene of foot ([place]) 08/30/2019  . Other fatigue 02/28/2019  . Finger pain, left 02/03/2019  . Trigger ring finger of left hand 02/03/2019  . Osteomyelitis, unspecified ([place]) 11/16/2018  . Diabetic retinopathy associated with type 2 diabetes mellitus ([place]) 11/15/2018  . Rheumatoid factor positive 11/15/2018  . Anemia in ESRD (end-stage renal disease) ([place]) 05/06/2018  . Asthma 05/06/2018  . Hx of adenomatous colonic polyps 04/07/2018  . Status post carpal tunnel release 12/10/2017  . Carpal tunnel syndrome, left upper limb 11/10/2017  . Carpal tunnel syndrome, right upper limb 11/10/2017  . Bilateral hand  numbness 09/28/2017  . Dependence on renal dialysis ([place]) 09/11/2017  . Symptomatic anemia 07/06/2017  . Hypertension 07/06/2017  . Chronic combined systolic and diastolic CHF (congestive heart failure) ([place]) 07/06/2017  . Complicated migraine 01/75/1025  . ESRD on dialysis ([place]) 06/04/2017  . Blindness of both eyes 06/04/2017  . Glaucoma 06/04/2017  . Essential hypertension 10/27/2016  . GERD (gastroesophageal reflux disease) 10/27/2016  . Hypercalcemia 05/28/2015  . Abnormal stress test 03/15/2015  . Poor venous access 02/08/2015  . Diarrhea,  unspecified 11/02/2014  . Fever, unspecified 11/02/2014  . Pain, unspecified 11/02/2014  . Pruritus, unspecified 11/02/2014  . Shortness of breath 07/28/2014  . Acquired absence of eye 03/31/2014  . Coagulation defect, unspecified ([place]) 03/22/2014  . Dysuria 02/14/2014  . Encounter for immunization 01/03/2014  . Iron deficiency anemia, unspecified 10/18/2013  . Unspecified protein-calorie malnutrition ([place]) 09/23/2013  . Complication of vascular dialysis catheter 06/21/2013  . Fibromyalgia 06/21/2013  . Personal history of breast cancer 06/21/2013  . Personal history of other diseases of the musculoskeletal system and connective tissue 06/21/2013  . Type 2 diabetes mellitus with diabetic polyneuropathy ([place]) 06/21/2013  . Secondary hyperparathyroidism of renal origin ([place]) 06/20/2013  . Unspecified complication of cardiac and vascular prosthetic device, implant and graft, subsequent encounter 06/20/2013  . Type II diabetes mellitus with renal manifestations ([place]) 06/15/2013  . Neuropathy 12/31/2011  . Breast cancer of upper-inner quadrant of left female breast ([place]) 06/27/2011    Past Surgical History:  Procedure Laterality Date  . A/V FISTULAGRAM N/A 09/21/2018   Procedure: A/V FISTULAGRAM - Right Upper;  Surgeon: [name], MD;  Location: [place] [facility];  Service: Cardiovascular;  Laterality: N/A;  . ABDOMINAL AORTOGRAM N/A 11/30/2018   Procedure: ABDOMINAL AORTOGRAM;  Surgeon: [name], MD;  Location: [place] [facility];  Service: Cardiovascular;  Laterality: N/A;  . ABDOMINAL AORTOGRAM W/LOWER EXTREMITY Left 07/29/2019   Procedure: ABDOMINAL AORTOGRAM W/LOWER EXTREMITY;  Surgeon: [name], MD;  Location: [place] [facility];  Service: Cardiovascular;  Laterality: Left;  . ABDOMINAL HYSTERECTOMY     partial  . AMPUTATION Left 08/30/2019   Procedure: AMPUTATION BELOW KNEE LEFT;  Surgeon: [name], MD;  Location: [facility] OR;  Service: Vascular;  Laterality:  Left;  . AMPUTATION Left 10/21/2019   Procedure: Amputation Above Knee;  Surgeon: Rosetta Posner, MD;  Location: [place];  Service: Vascular;  Laterality: Left;  . AMPUTATION Right 11/17/2019   Procedure: AMPUTATION ABOVE KNEE, right leg;  Surgeon: Rosetta Posner, MD;  Location: [place];  Service: Vascular;  Laterality: Right;  . AV FISTULA PLACEMENT Right 06/06/2013   Procedure: ARTERIOVENOUS (AV) FISTULA CREATION-RIGHT BRACHIAL CEPHALIC;  Surgeon: Conrad Stonyford, MD;  Location: [place];  Service: Vascular;  Laterality: Right;  . BREAST BIOPSY Left   . BREAST LUMPECTOMY Left    "and took out some lymph nodes" (06/15/2013)  . CARDIAC CATHETERIZATION     04/02/15 [facility]): no angiographic CAD, LVEF 40% with global hypokinesis (LHC done for + stress echo, EF 45% 11/28/14)  . CARPAL TUNNEL RELEASE Left 11/26/2017   Procedure: LEFT CARPAL TUNNEL RELEASE;  Surgeon: [name], MD;  Location: [place];  Service: Orthopedics;  Laterality: Left;  . CATARACT EXTRACTION W/ ANTERIOR VITRECTOMY Bilateral   . CESAREAN SECTION  1980  . COLONOSCOPY    . COLONOSCOPY WITH PROPOFOL N/A 05/25/2020   Procedure: COLONOSCOPY WITH PROPOFOL;  Surgeon: Yetta Flock, MD;  Location: [place];  Service:  Gastroenterology;  Laterality: N/A;  . ENTEROSCOPY N/A 12/18/2019   Procedure: ENTEROSCOPY;  Surgeon: Rush Landmark Telford Nab., MD;  Location: [place];  Service: Gastroenterology;  Laterality: N/A;  . ENTEROSCOPY N/A 05/24/2020   Procedure: ENTEROSCOPY;  Surgeon: Jackquline Denmark, MD;  Location: [facility] ENDOSCOPY;  Service: Endoscopy;  Laterality: N/A;  . ESOPHAGOGASTRODUODENOSCOPY N/A 07/08/2017   Procedure: ESOPHAGOGASTRODUODENOSCOPY (EGD);  Surgeon: [name], MD;  Location: [facility] ENDOSCOPY;  Service: Endoscopy;  Laterality: N/A;  . ESOPHAGOGASTRODUODENOSCOPY (EGD) WITH PROPOFOL N/A 02/07/2018   Procedure: ESOPHAGOGASTRODUODENOSCOPY (EGD) WITH PROPOFOL;  Surgeon: [name], MD;  Location: [facility] ENDOSCOPY;  Service:  Gastroenterology;  Laterality: N/A;  APC and clips placed  . EYE SURGERY Bilateral    laser surgery  . GIVENS CAPSULE STUDY N/A 12/19/2019   Procedure: GIVENS CAPSULE STUDY;  Surgeon: Irving Copas., MD;  Location: [place];  Service: Gastroenterology;  Laterality: N/A;  . HOT HEMOSTASIS N/A 12/18/2019   Procedure: HOT HEMOSTASIS (ARGON PLASMA COAGULATION/BICAP);  Surgeon: Irving Copas., MD;  Location: [place];  Service: Gastroenterology;  Laterality: N/A;  . HOT HEMOSTASIS Right 05/24/2020   Procedure: HOT HEMOSTASIS (ARGON PLASMA COAGULATION/BICAP);  Surgeon: Jackquline Denmark, MD;  Location[facility] ENDOSCOPY;  Service: Endoscopy;  Laterality: Right;  r colon  . LOWER EXTREMITY ANGIOGRAPHY Bilateral 11/30/2018   Procedure: LOWER EXTREMITY ANGIOGRAPHY;  Surgeon: [name], MD;  Location: [place] CV LAB;  Service: Cardiovascular;  Laterality: Bilateral;  . PARS PLANA VITRECTOMY Right 05/05/2017   Procedure: PARS PLANA VITRECTOMY WITH 25 GAUGE; PARTIAL REMOVAL OF OIL; INFERIOR PERIPHERAL IRIDECTOMY, REFORM ANTERIOR CHAMBER RIGHT EYE;  Surgeon: [name], MD;  Location: [place];  Service: Ophthalmology;  Laterality: Right;  . PERIPHERAL VASCULAR BALLOON ANGIOPLASTY Right 09/21/2018   Procedure: PERIPHERAL VASCULAR BALLOON ANGIOPLASTY;  Surgeon: [name], MD;  Location: [place] [facility];  Service: Cardiovascular;  Laterality: Right;  AV fistula  . PERIPHERAL VASCULAR BALLOON ANGIOPLASTY Left 11/30/2018   Procedure: PERIPHERAL VASCULAR BALLOON ANGIOPLASTY;  Surgeon: [name], MD;  Location: [place] CV LAB;  Service: Cardiovascular;  Laterality: Left;  Left Anterior Tibial Artery  . POLYPECTOMY  05/25/2020   Procedure: POLYPECTOMY;  Surgeon: Yetta Flock, MD;  Location: [facility] ENDOSCOPY;  Service: Gastroenterology;;  . REFRACTIVE SURGERY Bilateral   . REMOVAL OF A DIALYSIS CATHETER Right 06/06/2013   Procedure: REMOVAL OF RIGHT MEDIPORT;  Surgeon:  Conrad Fullerton, MD;  Location: [place];  Service: Vascular;  Laterality: Right;  . SUBMUCOSAL TATTOO INJECTION  12/18/2019   Procedure: SUBMUCOSAL TATTOO INJECTION;  Surgeon: Irving Copas., MD;  Location: [place];  Service: Gastroenterology;;  . TONSILLECTOMY    . UPPER GASTROINTESTINAL ENDOSCOPY    . WOUND DEBRIDEMENT Left 09/30/2019   Procedure: IRRIGATION AND DEBRIDEMENT WOUND OF LEFT BELOW KNEE AMPUTATION;  Surgeon: [name], MD;  Location: [facility] OR;  Service: Vascular;  Laterality: Left;     OB History   No obstetric history on file.     Family History  Problem Relation Age of Onset  . Diabetes Mother   . Hyperlipidemia Mother   . Hypertension Mother   . Hypertension Father   . Diabetes Sister   . Diabetes Brother   . Hypertension Brother   . Heart attack Brother   . Kidney disease Brother   . Colon cancer Neg Hx   . Colon polyps Neg Hx   . Esophageal cancer Neg Hx   . Gallbladder disease Neg Hx   .  Rectal cancer Neg Hx   . Stomach cancer Neg Hx     Social History   Tobacco Use  . Smoking status: Never Smoker  . Smokeless tobacco: Never Used  Vaping Use  . Vaping Use: Never used  Substance Use Topics  . Alcohol use: No  . Drug use: No    Home Medications Prior to Admission medications   Medication Sig Start Date End Date Taking? Authorizing Provider  acetaminophen (TYLENOL) 500 MG tablet Take 325 mg by mouth 2 (two) times daily as needed for headache or mild pain (pain).   Yes [provider]  albuterol (VENTOLIN HFA) 108 (90 Base) MCG/ACT inhaler Inhale 2 puffs into the lungs every 6 (six) hours as needed for wheezing or shortness of breath.    Yes [provider]  atropine 1 % ophthalmic solution Place 1 drop into the right eye 2 (two) times daily as needed (dry eyes).  07/04/17  Yes [provider]  BD PEN NEEDLE NANO 2ND GEN 32G X 4 MM MISC in the morning, at noon, and at bedtime.  03/03/19  Yes [provider]  darbepoetin (ARANESP) 200 MCG/0.4ML SOLN injection Inject 0.4 mLs (200 mcg total) into the vein every Wednesday with hemodialysis. 06/22/13  Yes Eulogio Bear U, DO  diclofenac sodium (VOLTAREN) 1 % GEL Apply 2 g topically 4 (four) times daily as needed (pain).  04/28/19  Yes [provider]  diphenhydrAMINE (BENADRYL) 25 MG tablet Take 25 mg by mouth See admin instructions. Take one tablet (25 mg) by mouth daily at bedtime, and take one tablet (25 mg) by mouth on Monday, Wednesday, Friday mornings before dialysis   Yes [provider]  docusate sodium (COLACE) 50 MG capsule Take 25 mg by mouth daily.   Yes [provider]  doxercalciferol (HECTOROL) 4 MCG/2ML injection Inject 0.5 mLs (1 mcg total) into the vein every Monday, Wednesday, and Friday with hemodialysis. 06/18/13  Yes Vann, Jessica U, DO  fluticasone (FLONASE) 50 MCG/ACT nasal spray Place 1 spray into both nostrils daily as needed for allergies or rhinitis (congestion).  06/08/20  Yes [provider]  gabapentin (NEURONTIN) 100 MG capsule Take 100 mg by mouth in the morning.   Yes [provider]  Hydrocortisone Acetate 1 % OINT Apply 1 application topically as needed (inflammation). 10/03/20  Yes [provider]  insulin aspart (NOVOLOG) 100 UNIT/ML FlexPen Inject 6-8 Units into the skin 2 (two) times daily with a meal. 6 units in the evening, 8 units in the morning   Yes [provider]  lanthanum (FOSRENOL) 1000 MG chewable tablet Chew 2,000-4,000 mg by mouth See admin instructions. Chew 4 tablets (4000 mg) by mouth with each meal and 2 tablets (2000 mg) with snacks   Yes [provider]  multivitamin (RENA-VIT) TABS tablet Take 1 tablet by mouth daily. 12/20/19  Yes [provider]  nystatin ointment (MYCOSTATIN) Apply topically 2 (two) times daily. 10/23/20  Yes [provider]  omeprazole (PRILOSEC) 40 MG capsule Take 1 capsule (40 mg total) by mouth  daily. 06/11/20  Yes Esterwood, Amy S, PA-C  ONETOUCH VERIO test strip 1 each by Other route 3 (three) times daily.  12/15/17  Yes [provider]  OXYGEN Inhale 3 L into the lungs continuous.   Yes [provider]  prednisoLONE acetate (PRED FORTE) 1 % ophthalmic suspension Place 1 drop into the right eye daily. For eye pain 04/27/17  Yes [provider]  traMADol (ULTRAM) 50 MG tablet Take 50 mg by mouth as needed for moderate pain.   Yes [provider]  traZODone (DESYREL) 100 MG tablet Take 100 mg by mouth at bedtime. 11/29/20  Yes [provider]  albuterol (PROVENTIL) (2.5 MG/3ML) 0.083% nebulizer solution Take 3 mLs (2.5 mg total) by nebulization every 6 (six) hours as needed for wheezing or shortness of breath. Patient not taking: Reported on 12/23/2020 10/30/16   Theodis Blaze, MD  clopidogrel (PLAVIX) 75 MG tablet TAKE 1 TABLET BY MOUTH EVERY DAY Patient taking differently: Take 75 mg by mouth daily. 12/01/20   Waynetta Sandy, MD  LORazepam (ATIVAN) 0.5 MG tablet Take a 1/2 tablet 30 minutes before MRI Patient not taking: Reported on 12/23/2020 06/11/20   Esterwood, Amy S, PA-C  ondansetron (ZOFRAN) 4 MG tablet Take 1 tablet (4 mg total) by mouth every 8 (eight) hours as needed for nausea or vomiting. Patient not taking: Reported on 12/23/2020 08/15/20   [name], MD    Allergies    Tape, Latex, Oxycodone, Tramadol, Aspirin, Morphine and related, and Vicodin [hydrocodone-acetaminophen]  Review of Systems   Review of Systems  Constitutional: Negative for chills and fever.  HENT: Negative for ear pain and sore throat.   Eyes: Negative for pain and visual disturbance.  Respiratory: Negative for cough and shortness of breath.   Cardiovascular: Negative for chest pain and palpitations.  Gastrointestinal: Negative for abdominal pain and vomiting.  Genitourinary: Negative for dysuria and hematuria.  Musculoskeletal: Positive for  arthralgias (R hip). Negative for back pain.  Skin: Positive for wound (left elbow). Negative for color change and rash.  Neurological: Negative for seizures, syncope and headaches.  All other systems reviewed and are negative.   Physical Exam Updated Vital Signs BP (!) 108/42   Pulse 81   Temp 98.6 F (37 C) (Oral)   Resp 17   Ht 5\' 5"  (1.651 m)   Wt 75.5 kg   LMP  (LMP Unknown)   SpO2 99%   BMI 27.70 kg/m   Physical Exam Vitals and nursing note reviewed.  Constitutional:      Appearance: She is ill-appearing (chronically).  HENT:     Head: Atraumatic. No right periorbital erythema or left periorbital erythema.     Jaw: There is normal jaw occlusion.     Comments:  Eyes closed    Mouth/Throat:     Mouth: Mucous membranes are moist. No injury.     Tongue: No lesions.  Cardiovascular:     Rate and Rhythm: Normal rate.     Arteriovenous access: right arteriovenous access is present. Abdominal:     General: Abdomen is flat.     Palpations: Abdomen is soft.     Tenderness: There is no abdominal tenderness.  Musculoskeletal:     Cervical back: Full passive range of motion without pain and normal range of motion. No signs of trauma or rigidity. No spinous process tenderness or muscular tenderness.     Right hip: Tenderness and bony tenderness present. Decreased range of motion.     Comments:  LUE: 2cm abrasion to the elbow, hemostatic. Underling hematoma. Full ROM of the elbow.     Right Lower Extremity: Right leg is amputated above knee.     Left Lower Extremity: Left leg is amputated above knee.  Neurological:     General: No focal deficit present.     Mental Status: She is alert and oriented to person, place, and time.  Cranial Nerves: Cranial nerves are intact.  Psychiatric:        Behavior: Behavior is cooperative.     ED Results / Procedures / Treatments   Labs (all labs ordered are listed, but only abnormal results are displayed) Labs Reviewed  BASIC  METABOLIC PANEL - Abnormal; Notable for the following components:      Result Value   Chloride 96 (*)    Glucose, Bld 108 (*)    BUN 24 (*)    Creatinine, Ser 8.41 (*)    GFR, Estimated 5 (*)    All other components within normal limits  CBC WITH DIFFERENTIAL/PLATELET - Abnormal; Notable for the following components:   RBC 3.10 (*)    Hemoglobin 9.7 (*)    HCT 30.8 (*)    RDW 20.6 (*)    All other components within normal limits  GLUCOSE, CAPILLARY - Abnormal; Notable for the following components:   Glucose-Capillary 105 (*)    All other components within normal limits  GLUCOSE, CAPILLARY - Abnormal; Notable for the following components:   Glucose-Capillary 103 (*)    All other components within normal limits  RESP PANEL BY RT-PCR (FLU A&B, COVID) ARPGX2  SURGICAL PCR SCREEN  HIV ANTIBODY (ROUTINE TESTING W REFLEX)  HEMOGLOBIN J8S  BASIC METABOLIC PANEL    EKG None  Radiology DG Pelvis 1-2 Views  Result Date: 12/22/2020 CLINICAL DATA:  Fall, RIGHT hip pain. EXAM: PELVIS - 1-2 VIEW COMPARISON:  None. FINDINGS: Displaced fracture of the RIGHT femoral neck. Visualized portions of the osseous pelvis appear intact and normally aligned. Sacrum in the at bones partially obscured by overlying oral contrast material. Extensive vascular calcifications throughout the pelvis and upper thigh regions. IMPRESSION: Displaced fracture of the RIGHT femoral neck. Electronically Signed   By: Franki Cabot M.D.   On: 12/22/2020 22:40   DG Elbow 2 Views Left  Result Date: 12/22/2020 CLINICAL DATA:  Fall, RIGHT hip pain EXAM: LEFT ELBOW - 2 VIEW COMPARISON:  None FINDINGS: Osseous structures about the elbow appear intact and normally aligned. No fracture line or displaced fracture fragment. No appreciable joint effusion. Soft tissue swelling/edema posterior to the olecranon. Extensive vascular calcifications about the elbow. IMPRESSION: Soft tissue swelling/edema posterior to the olecranon. No osseous  fracture or dislocation. Electronically Signed   By: Franki Cabot M.D.   On: 12/22/2020 22:41   DG Femur Min 2 Views Right  Result Date: 12/22/2020 CLINICAL DATA:  Fall, RIGHT hip pain. EXAM: RIGHT FEMUR 2 VIEWS COMPARISON:  None. FINDINGS: Displaced fracture within the RIGHT femoral neck, intratrochanteric with involvement of the greater trochanter. Associated angulation deformity at the fracture site, nearly 90 degrees. Femoral head remains grossly well positioned relative to the acetabulum. Surgical amputation deformity of the mid RIGHT femur. Extensive vascular calcifications of the RIGHT upper thigh. IMPRESSION: Displaced fracture within the intertrochanteric RIGHT femoral neck with prominent angulation deformity at the fracture site. Electronically Signed   By: Franki Cabot M.D.   On: 12/22/2020 22:38    Procedures Procedures   Medications Ordered in ED Medications  pantoprazole (PROTONIX) EC tablet 40 mg (40 mg Oral Given 12/23/20 0836)  albuterol (PROVENTIL) (2.5 MG/3ML) 0.083% nebulizer solution 3 mL (has no administration in time range)  HYDROcodone-acetaminophen (NORCO/VICODIN) 5-325 MG per tablet 1-2 tablet (2 tablets Oral Given 12/23/20 0240)  HYDROmorphone (DILAUDID) injection 1 mg (has no administration in time range)  senna-docusate (Senokot-S) tablet 1 tablet (has no administration in time range)  insulin aspart (novoLOG) injection  0-6 Units (has no administration in time range)  doxercalciferol (HECTOROL) injection 3 mcg (has no administration in time range)  fentaNYL (SUBLIMAZE) injection 50 mcg (50 mcg Intravenous Given 12/22/20 2241)    ED Course  I have reviewed the triage vital signs and the nursing notes.  Pertinent labs & imaging results that were available during my care of the patient were reviewed by me and considered in my medical decision making (see chart for details).  Clinical Course as of 12/23/20 1549  Sat Dec 22, 2020  2153 Ordered for BMP and CBC. Says  that her HD center was unable to do her dialysis on Friday. Last HD was on Wednesday (6/1) [ZB]  2249 Spoke with ortho. They will eval and determine plan for potential operative management. [ZB]  2320 Patient tells me she took her Plavix today as prescribed.  [ZB]    Clinical Course User Index [ZB] Pearson Grippe, DO   MDM Rules/Calculators/A&P                          Patient is a 70 year old female with history as above relevant for ESRD on hemodialysis and bilateral AKAs who presented to the emergency department after sustaining injury to her right hip/pelvis after a mechanical fall out of her wheelchair today. Primary concern is right hip/femur fracture/dislocation.  Exam limited by pain and AKA.  No open wounds on the lower extremities. Though considered, I have a low index of suspicion for traumatic intracranial, spinal, intrathoracic or intra-abdominal injury that would necessitate CT imaging at this time. Ordered for plain films of the pelvis, right hip and left elbow.  Please see clinical course above for further medical decision making and ED course.  Handoff to oncoming team pending lab work and admission to medicine pending operative eval from ortho.  Final Clinical Impression(s) / ED Diagnoses Final diagnoses:  Closed displaced intertrochanteric fracture of right femur, initial encounter [facility])    Rx / [place] Orders ED Discharge Orders    None       Pearson Grippe, DO 12/23/20 1549    Sherwood Gambler, MD 12/25/20 [phone]

## 2020-12-23 ENCOUNTER — Inpatient Hospital Stay (HOSPITAL_COMMUNITY): Payer: Medicare Other | Admitting: Certified Registered Nurse Anesthetist

## 2020-12-23 ENCOUNTER — Encounter (HOSPITAL_COMMUNITY): Payer: Self-pay | Admitting: Family Medicine

## 2020-12-23 ENCOUNTER — Inpatient Hospital Stay (HOSPITAL_COMMUNITY): Payer: Medicare Other

## 2020-12-23 ENCOUNTER — Encounter (HOSPITAL_COMMUNITY)
Admission: EM | Disposition: A | Discharge status: Home Health | Payer: Self-pay | Source: Home / Self Care | Attending: Internal Medicine

## 2020-12-23 DIAGNOSIS — S72001A Fracture of unspecified part of neck of right femur, initial encounter for closed fracture: Secondary | ICD-10-CM

## 2020-12-23 HISTORY — PX: INTRAMEDULLARY (IM) NAIL INTERTROCHANTERIC: SHX5875

## 2020-12-23 LAB — BASIC METABOLIC PANEL
Anion gap: 16 — ABNORMAL HIGH (ref 5–15)
BUN: 28 mg/dL — ABNORMAL HIGH (ref 8–23)
CO2: 25 mmol/L (ref 22–32)
Calcium: 9 mg/dL (ref 8.9–10.3)
Chloride: 98 mmol/L (ref 98–111)
Creatinine, Ser: 9.46 mg/dL — ABNORMAL HIGH (ref 0.44–1.00)
GFR, Estimated: 4 mL/min — ABNORMAL LOW (ref >60)
Glucose, Bld: 83 mg/dL (ref 70–99)
Potassium: 4.9 mmol/L (ref 3.5–5.1)
Sodium: 139 mmol/L (ref 135–145)

## 2020-12-23 LAB — RESP PANEL BY RT-PCR (FLU A&B, COVID) ARPGX2
Influenza A by PCR: NEGATIVE
Influenza B by PCR: NEGATIVE
SARS Coronavirus 2 by RT PCR: NEGATIVE

## 2020-12-23 LAB — GLUCOSE, CAPILLARY
Glucose-Capillary: 105 mg/dL — ABNORMAL HIGH (ref 70–99)
Glucose-Capillary: 103 mg/dL — ABNORMAL HIGH (ref 70–99)
Glucose-Capillary: 100 mg/dL — ABNORMAL HIGH (ref 70–99)
Glucose-Capillary: 113 mg/dL — ABNORMAL HIGH (ref 70–99)

## 2020-12-23 LAB — SURGICAL PCR SCREEN
MRSA, PCR: POSITIVE — AB
Staphylococcus aureus: POSITIVE — AB

## 2020-12-23 LAB — HIV ANTIBODY (ROUTINE TESTING W REFLEX): HIV Screen 4th Generation wRfx: NONREACTIVE

## 2020-12-23 SURGERY — FIXATION, FRACTURE, INTERTROCHANTERIC, WITH INTRAMEDULLARY ROD
Anesthesia: General | Laterality: Right

## 2020-12-23 MED ORDER — CLOPIDOGREL BISULFATE 75 MG PO TABS
75.0000 mg | ORAL_TABLET | Freq: Every day | ORAL | Status: DC
  Administered 2020-12-24 – 2020-12-26 (×3): 75 mg via ORAL
  Filled 2020-12-23 (×4): qty 1

## 2020-12-23 MED ORDER — MIDAZOLAM HCL 2 MG/2ML IJ SOLN
INTRAMUSCULAR | Status: AC
  Filled 2020-12-23: qty 2

## 2020-12-23 MED ORDER — SENNOSIDES-DOCUSATE SODIUM 8.6-50 MG PO TABS
1.0000 | ORAL_TABLET | Freq: Every evening | ORAL | Status: DC | PRN
  Administered 2020-12-25: 1 via ORAL
  Filled 2020-12-23: qty 1

## 2020-12-23 MED ORDER — DEXAMETHASONE SODIUM PHOSPHATE 10 MG/ML IJ SOLN
INTRAMUSCULAR | Status: DC | PRN
  Administered 2020-12-23: 10 mg via INTRAVENOUS

## 2020-12-23 MED ORDER — CEFAZOLIN SODIUM-DEXTROSE 2-4 GM/100ML-% IV SOLN: 2.0000 g | Freq: Four times a day (QID) | INTRAVENOUS | Status: DC

## 2020-12-23 MED ORDER — PROMETHAZINE HCL 25 MG/ML IJ SOLN: 6.2500 mg | INTRAMUSCULAR | Status: DC | PRN

## 2020-12-23 MED ORDER — CHLORHEXIDINE GLUCONATE CLOTH 2 % EX PADS
6.0000 | MEDICATED_PAD | Freq: Every day | CUTANEOUS | Status: DC
  Administered 2020-12-24 – 2020-12-26 (×3): 6 via TOPICAL

## 2020-12-23 MED ORDER — ALBUTEROL SULFATE (2.5 MG/3ML) 0.083% IN NEBU: 3.0000 mL | INHALATION_SOLUTION | RESPIRATORY_TRACT | Status: DC | PRN

## 2020-12-23 MED ORDER — HYDROMORPHONE HCL 1 MG/ML IJ SOLN: 1.0000 mg | INTRAMUSCULAR | Status: DC | PRN

## 2020-12-23 MED ORDER — METOCLOPRAMIDE HCL 5 MG/ML IJ SOLN: 5.0000 mg | Freq: Three times a day (TID) | INTRAMUSCULAR | Status: DC | PRN

## 2020-12-23 MED ORDER — ONDANSETRON HCL 4 MG/2ML IJ SOLN
INTRAMUSCULAR | Status: DC | PRN
  Administered 2020-12-23: 4 mg via INTRAVENOUS

## 2020-12-23 MED ORDER — SODIUM CHLORIDE 0.9 % IV SOLN: INTRAVENOUS | Status: DC

## 2020-12-23 MED ORDER — ORAL CARE MOUTH RINSE: 15.0000 mL | Freq: Once | OROMUCOSAL | Status: AC

## 2020-12-23 MED ORDER — FENTANYL CITRATE (PF) 100 MCG/2ML IJ SOLN: 25.0000 ug | INTRAMUSCULAR | Status: DC | PRN

## 2020-12-23 MED ORDER — CHLORHEXIDINE GLUCONATE 0.12 % MT SOLN
OROMUCOSAL | Status: AC
  Administered 2020-12-23: 15 mL via OROMUCOSAL
  Filled 2020-12-23: qty 15

## 2020-12-23 MED ORDER — PHENOL 1.4 % MT LIQD: 1.0000 | OROMUCOSAL | Status: DC | PRN

## 2020-12-23 MED ORDER — PHENYLEPHRINE HCL (PRESSORS) 10 MG/ML IV SOLN
INTRAVENOUS | Status: DC | PRN
  Administered 2020-12-23 (×2): 120 ug via INTRAVENOUS

## 2020-12-23 MED ORDER — ACETAMINOPHEN 10 MG/ML IV SOLN: 1000.0000 mg | Freq: Once | INTRAVENOUS | Status: DC | PRN

## 2020-12-23 MED ORDER — PROPOFOL 10 MG/ML IV BOLUS
INTRAVENOUS | Status: DC | PRN
  Administered 2020-12-23: 130 mg via INTRAVENOUS

## 2020-12-23 MED ORDER — HYDROCODONE-ACETAMINOPHEN 5-325 MG PO TABS
1.0000 | ORAL_TABLET | Freq: Four times a day (QID) | ORAL | Status: DC | PRN
  Administered 2020-12-23: 2 via ORAL
  Filled 2020-12-23: qty 2

## 2020-12-23 MED ORDER — CEFAZOLIN SODIUM-DEXTROSE 2-4 GM/100ML-% IV SOLN
INTRAVENOUS | Status: AC
  Filled 2020-12-23: qty 100

## 2020-12-23 MED ORDER — PHENYLEPHRINE HCL-NACL 10-0.9 MG/250ML-% IV SOLN
INTRAVENOUS | Status: DC | PRN
  Administered 2020-12-23: 25 ug/min via INTRAVENOUS

## 2020-12-23 MED ORDER — INSULIN ASPART 100 UNIT/ML IJ SOLN: 0.0000 [IU] | Freq: Four times a day (QID) | INTRAMUSCULAR | Status: DC | PRN

## 2020-12-23 MED ORDER — HYDROMORPHONE HCL 2 MG PO TABS
2.0000 mg | ORAL_TABLET | ORAL | Status: DC | PRN
  Administered 2020-12-25 – 2020-12-26 (×2): 2 mg via ORAL
  Filled 2020-12-23 (×3): qty 1

## 2020-12-23 MED ORDER — PROPOFOL 10 MG/ML IV BOLUS
INTRAVENOUS | Status: AC
  Filled 2020-12-23: qty 20

## 2020-12-23 MED ORDER — CEFAZOLIN SODIUM-DEXTROSE 2-3 GM-%(50ML) IV SOLR
INTRAVENOUS | Status: DC | PRN
  Administered 2020-12-23: 2 g via INTRAVENOUS

## 2020-12-23 MED ORDER — FENTANYL CITRATE (PF) 250 MCG/5ML IJ SOLN
INTRAMUSCULAR | Status: AC
  Filled 2020-12-23: qty 5

## 2020-12-23 MED ORDER — FENTANYL CITRATE (PF) 250 MCG/5ML IJ SOLN
INTRAMUSCULAR | Status: DC | PRN
  Administered 2020-12-23 (×2): 50 ug via INTRAVENOUS
  Administered 2020-12-23: 100 ug via INTRAVENOUS

## 2020-12-23 MED ORDER — ALBUMIN HUMAN 5 % IV SOLN: INTRAVENOUS | Status: DC | PRN

## 2020-12-23 MED ORDER — MUPIROCIN 2 % EX OINT: 1.0000 "application " | TOPICAL_OINTMENT | Freq: Two times a day (BID) | CUTANEOUS | Status: DC

## 2020-12-23 MED ORDER — ONDANSETRON HCL 4 MG PO TABS
4.0000 mg | ORAL_TABLET | Freq: Four times a day (QID) | ORAL | Status: DC | PRN
  Administered 2020-12-25: 4 mg via ORAL
  Filled 2020-12-23: qty 1

## 2020-12-23 MED ORDER — 0.9 % SODIUM CHLORIDE (POUR BTL) OPTIME
TOPICAL | Status: DC | PRN
  Administered 2020-12-23: 1000 mL

## 2020-12-23 MED ORDER — DOXERCALCIFEROL 4 MCG/2ML IV SOLN
3.0000 ug | INTRAVENOUS | Status: DC
  Filled 2020-12-23 (×2): qty 2

## 2020-12-23 MED ORDER — ONDANSETRON HCL 4 MG/2ML IJ SOLN: 4.0000 mg | Freq: Four times a day (QID) | INTRAMUSCULAR | Status: DC | PRN

## 2020-12-23 MED ORDER — CHLORHEXIDINE GLUCONATE 0.12 % MT SOLN: 15.0000 mL | Freq: Once | OROMUCOSAL | Status: AC

## 2020-12-23 MED ORDER — ACETAMINOPHEN 500 MG PO TABS
1000.0000 mg | ORAL_TABLET | Freq: Three times a day (TID) | ORAL | Status: DC
  Administered 2020-12-23 – 2020-12-26 (×9): 1000 mg via ORAL
  Filled 2020-12-23 (×10): qty 2

## 2020-12-23 MED ORDER — METOCLOPRAMIDE HCL 5 MG PO TABS: 5.0000 mg | ORAL_TABLET | Freq: Three times a day (TID) | ORAL | Status: DC | PRN

## 2020-12-23 MED ORDER — MENTHOL 3 MG MT LOZG: 1.0000 | LOZENGE | OROMUCOSAL | Status: DC | PRN

## 2020-12-23 MED ORDER — PANTOPRAZOLE SODIUM 40 MG PO TBEC
40.0000 mg | DELAYED_RELEASE_TABLET | Freq: Every day | ORAL | Status: DC
  Administered 2020-12-23 – 2020-12-27 (×4): 40 mg via ORAL
  Filled 2020-12-23 (×4): qty 1

## 2020-12-23 MED ORDER — LIDOCAINE 2% (20 MG/ML) 5 ML SYRINGE
INTRAMUSCULAR | Status: DC | PRN
  Administered 2020-12-23: 60 mg via INTRAVENOUS

## 2020-12-23 MED ORDER — ROCURONIUM BROMIDE 10 MG/ML (PF) SYRINGE
PREFILLED_SYRINGE | INTRAVENOUS | Status: DC | PRN
  Administered 2020-12-23: 50 mg via INTRAVENOUS
  Administered 2020-12-23: 30 mg via INTRAVENOUS

## 2020-12-23 MED ORDER — HYDROMORPHONE HCL 1 MG/ML IJ SOLN: 0.5000 mg | INTRAMUSCULAR | Status: DC | PRN

## 2020-12-23 MED ORDER — MIDAZOLAM HCL 2 MG/2ML IJ SOLN
INTRAMUSCULAR | Status: DC | PRN
  Administered 2020-12-23: 1 mg via INTRAVENOUS

## 2020-12-23 MED ORDER — DOCUSATE SODIUM 100 MG PO CAPS
100.0000 mg | ORAL_CAPSULE | Freq: Two times a day (BID) | ORAL | Status: DC
  Administered 2020-12-23 – 2020-12-27 (×6): 100 mg via ORAL
  Filled 2020-12-23 (×7): qty 1

## 2020-12-23 SURGICAL SUPPLY — 48 items
BIT DRILL CANN LG 4.3MM (BIT) IMPLANT
BNDG COHESIVE 6X5 TAN STRL LF (GAUZE/BANDAGES/DRESSINGS) ×3 IMPLANT
BOLT INSERTION JIG (BOLT) ×3 IMPLANT
BRUSH SCRUB EZ PLAIN DRY (MISCELLANEOUS) ×6 IMPLANT
COVER SURGICAL LIGHT HANDLE (MISCELLANEOUS) ×3 IMPLANT
DRAPE C-ARM 42X72 X-RAY (DRAPES) ×3 IMPLANT
DRAPE C-ARMOR (DRAPES) ×3 IMPLANT
DRAPE HALF SHEET 40X57 (DRAPES) ×2 IMPLANT
DRAPE U-SHAPE 47X51 STRL (DRAPES) ×3 IMPLANT
DRESSING MEPILEX FLEX 4X4 (GAUZE/BANDAGES/DRESSINGS) IMPLANT
DRILL BIT CANN LG 4.3MM (BIT) ×3
DRSG MEPILEX BORDER 4X12 (GAUZE/BANDAGES/DRESSINGS) ×2 IMPLANT
DRSG MEPILEX BORDER 4X4 (GAUZE/BANDAGES/DRESSINGS) IMPLANT
DRSG MEPILEX BORDER 4X8 (GAUZE/BANDAGES/DRESSINGS) IMPLANT
DRSG MEPILEX FLEX 4X4 (GAUZE/BANDAGES/DRESSINGS) ×30
ELECT REM PT RETURN 9FT ADLT (ELECTROSURGICAL) ×3
ELECTRODE REM PT RTRN 9FT ADLT (ELECTROSURGICAL) ×1 IMPLANT
GLOVE BIO SURGEON STRL SZ7.5 (GLOVE) ×3 IMPLANT
GLOVE BIO SURGEON STRL SZ8 (GLOVE) ×3 IMPLANT
GLOVE BIOGEL PI IND STRL 7.5 (GLOVE) ×1 IMPLANT
GLOVE BIOGEL PI INDICATOR 7.5 (GLOVE) ×2
GLOVE SRG 8 PF TXTR STRL LF DI (GLOVE) ×1 IMPLANT
GLOVE SURG UNDER POLY LF SZ8 (GLOVE) ×3
GOWN STRL REUS W/ TWL LRG LVL3 (GOWN DISPOSABLE) ×2 IMPLANT
GOWN STRL REUS W/ TWL XL LVL3 (GOWN DISPOSABLE) ×1 IMPLANT
GOWN STRL REUS W/TWL LRG LVL3 (GOWN DISPOSABLE) ×3
GOWN STRL REUS W/TWL XL LVL3 (GOWN DISPOSABLE) ×6
GUIDEPIN 3.2X17.5 THRD DISP (PIN) ×4 IMPLANT
GUIDEWIRE BALL NOSE 80CM (WIRE) ×2 IMPLANT
HFN LAG SCREW 10.5MM X 85MM (Screw) ×2 IMPLANT
KIT BASIN OR (CUSTOM PROCEDURE TRAY) ×3 IMPLANT
KIT TURNOVER KIT B (KITS) ×3 IMPLANT
NAIL HIP FRACT 130D 11X180 (Screw) ×3 IMPLANT
NS IRRIG 1000ML POUR BTL (IV SOLUTION) ×3 IMPLANT
PACK GENERAL/GYN (CUSTOM PROCEDURE TRAY) ×3 IMPLANT
PAD ARMBOARD 7.5X6 YLW CONV (MISCELLANEOUS) ×6 IMPLANT
SCREW BONE CORTICAL 5.0X36 (Screw) ×2 IMPLANT
STAPLER VISISTAT 35W (STAPLE) ×3 IMPLANT
SUT ETHILON 2 0 FS 18 (SUTURE) ×3 IMPLANT
SUT ETHILON 3 0 PS 1 (SUTURE) ×1 IMPLANT
SUT VIC AB 0 CT1 27 (SUTURE) ×3
SUT VIC AB 0 CT1 27XBRD ANBCTR (SUTURE) IMPLANT
SUT VIC AB 1 CT1 27 (SUTURE)
SUT VIC AB 1 CT1 27XBRD ANBCTR (SUTURE) ×1 IMPLANT
SUT VIC AB 2-0 CT1 27 (SUTURE) ×3
SUT VIC AB 2-0 CT1 TAPERPNT 27 (SUTURE) ×1 IMPLANT
TOWEL GREEN STERILE (TOWEL DISPOSABLE) ×6 IMPLANT
TOWEL GREEN STERILE FF (TOWEL DISPOSABLE) ×3 IMPLANT

## 2020-12-23 NOTE — Consult Note (Addendum)
Orthopaedic Trauma Service Consultation  Reason for Consult: Right basocervical femoral neck fracture Referring Physician: Harrold Donath, MD  [name] is an 70 y.o. female.  HPI: Patient fell from her wheelchair with acute right hip pain which she describes as a 9 at rest and over 10 with any movement. 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 Host of medical problems. We had frank discussion with her and her son about nonoperative vs operative treament, risks of death, infection, and others given her overall health.   Past Medical History:  Diagnosis Date  . Anemia   . Anemia   . Anxiety   . Arthritis    "joints" (06/15/2013)  . Asthma   . 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 Blood transfusion without reported diagnosis   . Breast cancer ([place])    left  . CHF (congestive heart failure) ([place])   . 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 Esophageal ulcer with bleeding   . ESRD (end stage renal disease) ([place])    "suppose to start dialysis today" (06/15/2013)  . Family history of adverse reaction to anesthesia    It took a while for pt sister to wake from anesthesia  . GERD (gastroesophageal reflux disease)   . GI bleed   . Glaucoma    blind in both eyes  . Heart murmur    Mild AS, moderate MR, moderate TR 10/30/16 echo  . Hx of adenomatous colonic polyps 04/07/2018  . Hypertension   . Myalgia 12/31/2011  . Neuropathy 12/31/2011  . PAD (peripheral artery disease) (H[facility])    nonviable tissue left lower extremity  . Shortness of breath    "when she doesn't go to dialysis"  . Type II diabetes mellitus ([place])    Type II    Past Surgical History:  Procedure Laterality Date  . A/V FISTULAGRAM N/A 09/21/2018   Procedure: A/V FISTULAGRAM - Right Upper;  Surgeon: [name], MD;  Location: [place] [facility];  Service: Cardiovascular;  Laterality: N/A;  . ABDOMINAL AORTOGRAM N/A 11/30/2018   Procedure: ABDOMINAL AORTOGRAM;  Surgeon:  [name], MD;  Location: [place] [facility];  Service: Cardiovascular;  Laterality: N/A;  . ABDOMINAL AORTOGRAM W/LOWER EXTREMITY Left 07/29/2019   Procedure: ABDOMINAL AORTOGRAM W/LOWER EXTREMITY;  Surgeon: [name], MD;  Location: [place] [facility];  Service: Cardiovascular;  Laterality: Left;  . ABDOMINAL HYSTERECTOMY     partial  . AMPUTATION Left 08/30/2019   Procedure: AMPUTATION BELOW KNEE LEFT;  Surgeon: [name], MD;  Location[facility];  Service: Vascular;  Laterality: Left;  . AMPUTATION Left 10/21/2019   Procedure: Amputation Above Knee;  Surgeon: Rosetta Posner, MD;  Location: [place];  Service: Vascular;  Laterality: Left;  . AMPUTATION Right 11/17/2019   Procedure: AMPUTATION ABOVE KNEE, right leg;  Surgeon: Rosetta Posner, MD;  Location: [place];  Service: Vascular;  Laterality: Right;  . AV FISTULA PLACEMENT Right 06/06/2013   Procedure: ARTERIOVENOUS (AV) FISTULA CREATION-RIGHT BRACHIAL CEPHALIC;  Surgeon: Conrad Caroline, MD;  Location: [place];  Service: Vascular;  Laterality: Right;  . BREAST BIOPSY Left   . BREAST LUMPECTOMY Left    "and took out some lymph nodes" (06/15/2013)  . CARDIAC CATHETERIZATION     04/02/15 [facility]): no angiographic CAD, LVEF 40% with global hypokinesis (LHC done for + stress echo, EF 45% 11/28/14)  . CARPAL TUNNEL RELEASE Left 11/26/2017  Procedure: LEFT CARPAL TUNNEL RELEASE;  Surgeon: [name], MD;  Location: [place];  Service: Orthopedics;  Laterality: Left;  . CATARACT EXTRACTION W/ ANTERIOR VITRECTOMY Bilateral   . CESAREAN SECTION  1980  . COLONOSCOPY    . COLONOSCOPY WITH PROPOFOL N/A 05/25/2020   Procedure: COLONOSCOPY WITH PROPOFOL;  Surgeon: Yetta Flock, MD;  Location: [place];  Service: Gastroenterology;  Laterality: N/A;  . ENTEROSCOPY N/A 12/18/2019   Procedure: ENTEROSCOPY;  Surgeon: Rush Landmark Telford Nab., MD;  Location: [place];  Service: Gastroenterology;  Laterality: N/A;  . ENTEROSCOPY N/A  05/24/2020   Procedure: ENTEROSCOPY;  Surgeon: Jackquline Denmark, MD;  Location: [facility] ENDOSCOPY;  Service: Endoscopy;  Laterality: N/A;  . ESOPHAGOGASTRODUODENOSCOPY N/A 07/08/2017   Procedure: ESOPHAGOGASTRODUODENOSCOPY (EGD);  Surgeon: [name], MD;  Location: [facility] ENDOSCOPY;  Service: Endoscopy;  Laterality: N/A;  . ESOPHAGOGASTRODUODENOSCOPY (EGD) WITH PROPOFOL N/A 02/07/2018   Procedure: ESOPHAGOGASTRODUODENOSCOPY (EGD) WITH PROPOFOL;  Surgeon: [name], MD;  Location: [facility] ENDOSCOPY;  Service: Gastroenterology;  Laterality: N/A;  APC and clips placed  . EYE SURGERY Bilateral    laser surgery  . GIVENS CAPSULE STUDY N/A 12/19/2019   Procedure: GIVENS CAPSULE STUDY;  Surgeon: Irving Copas., MD;  Location: [place];  Service: Gastroenterology;  Laterality: N/A;  . HOT HEMOSTASIS N/A 12/18/2019   Procedure: HOT HEMOSTASIS (ARGON PLASMA COAGULATION/BICAP);  Surgeon: Irving Copas., MD;  Location: [place];  Service: Gastroenterology;  Laterality: N/A;  . HOT HEMOSTASIS Right 05/24/2020   Procedure: HOT HEMOSTASIS (ARGON PLASMA COAGULATION/BICAP);  Surgeon: Jackquline Denmark, MD;  Location: [facility] ENDOSCOPY;  Service: Endoscopy;  Laterality: Right;  r colon  . LOWER EXTREMITY ANGIOGRAPHY Bilateral 11/30/2018   Procedure: LOWER EXTREMITY ANGIOGRAPHY;  Surgeon: [name], MD;  Location: [place] [facility];  Service: Cardiovascular;  Laterality: Bilateral;  . PARS PLANA VITRECTOMY Right 05/05/2017   Procedure: PARS PLANA VITRECTOMY WITH 25 GAUGE; PARTIAL REMOVAL OF OIL; INFERIOR PERIPHERAL IRIDECTOMY, REFORM ANTERIOR CHAMBER RIGHT EYE;  Surgeon: [name], MD;  Location: [place];  Service: Ophthalmology;  Laterality: Right;  . PERIPHERAL VASCULAR BALLOON ANGIOPLASTY Right 09/21/2018   Procedure: PERIPHERAL VASCULAR BALLOON ANGIOPLASTY;  Surgeon: [name], MD;  Location: [place] [facility];  Service: Cardiovascular;  Laterality: Right;  AV fistula  . PERIPHERAL  VASCULAR BALLOON ANGIOPLASTY Left 11/30/2018   Procedure: PERIPHERAL VASCULAR BALLOON ANGIOPLASTY;  Surgeon: [name], MD;  Location: [place] [facility];  Service: Cardiovascular;  Laterality: Left;  Left Anterior Tibial Artery  . POLYPECTOMY  05/25/2020   Procedure: POLYPECTOMY;  Surgeon: Yetta Flock, MD;  Location: [facility] ENDOSCOPY;  Service: Gastroenterology;;  . REFRACTIVE SURGERY Bilateral   . REMOVAL OF A DIALYSIS CATHETER Right 06/06/2013   Procedure: REMOVAL OF RIGHT MEDIPORT;  Surgeon: Conrad Centralia, MD;  Location: [place];  Service: Vascular;  Laterality: Right;  . SUBMUCOSAL TATTOO INJECTION  12/18/2019   Procedure: SUBMUCOSAL TATTOO INJECTION;  Surgeon: Irving Copas., MD;  Location: [place];  Service: Gastroenterology;;  . TONSILLECTOMY    . UPPER GASTROINTESTINAL ENDOSCOPY    . WOUND DEBRIDEMENT Left 09/30/2019   Procedure: IRRIGATION AND DEBRIDEMENT WOUND OF LEFT BELOW KNEE AMPUTATION;  Surgeon: [name], MD;  Location: [facility] OR;  Service: Vascular;  Laterality: Left;    Family History  Problem Relation Age of Onset  . Diabetes Mother   . Hyperlipidemia Mother   . Hypertension Mother   . Hypertension Father   . Diabetes Sister   .  Diabetes Brother   . Hypertension Brother   . Heart attack Brother   . Kidney disease Brother   . Colon cancer Neg Hx   . Colon polyps Neg Hx   . Esophageal cancer Neg Hx   . Gallbladder disease Neg Hx   . Rectal cancer Neg Hx   . Stomach cancer Neg Hx     Social History:  reports that she has never smoked. She has never used smokeless tobacco. She reports that she does not drink alcohol and does not use drugs.  Allergies:  Allergies  Allergen Reactions  . Tape Itching and Rash    66M Transpore adhesive tape. Medical tape pulls off the skin!! PAPER TAPE ONLY, PLEASE  . Latex Hives  . Oxycodone Other (See Comments)    Hallucinations   . Tramadol Other (See Comments)    Hallucinations with a full tablet  .  Aspirin Other (See Comments)    Trouble with stomach bleeding  . Morphine And Related Other (See Comments)    Hallucinations   . Vicodin [Hydrocodone-Acetaminophen] Other (See Comments)    Hallucinations     Medications:  Prior to Admission:  Medications Prior to Admission  Medication Sig Dispense Refill Last Dose  . acetaminophen (TYLENOL) 500 MG tablet Take 325 mg by mouth 2 (two) times daily as needed for headache or mild pain (pain).   Past Week at Unknown time  . albuterol (VENTOLIN HFA) 108 (90 Base) MCG/ACT inhaler Inhale 2 puffs into the lungs every 6 (six) hours as needed for wheezing or shortness of breath.    unk  . atropine 1 % ophthalmic solution Place 1 drop into the right eye 2 (two) times daily as needed (dry eyes).   0 Past Week at Unknown time  . BD PEN NEEDLE NANO 2ND GEN 32G X 4 MM MISC in the morning, at noon, and at bedtime.    12/22/2020 at Unknown time  . darbepoetin (ARANESP) 200 MCG/0.4ML SOLN injection Inject 0.4 mLs (200 mcg total) into the vein every Wednesday with hemodialysis. 1.68 mL  Past Week at Unknown time  . diclofenac sodium (VOLTAREN) 1 % GEL Apply 2 g topically 4 (four) times daily as needed (pain).    Past Week at Unknown time  . diphenhydrAMINE (BENADRYL) 25 MG tablet Take 25 mg by mouth See admin instructions. Take one tablet (25 mg) by mouth daily at bedtime, and take one tablet (25 mg) by mouth on Monday, Wednesday, Friday mornings before dialysis   Past Week at Unknown time  . docusate sodium (COLACE) 50 MG capsule Take 25 mg by mouth daily.   12/22/2020 at Unknown time  . doxercalciferol (HECTOROL) 4 MCG/2ML injection Inject 0.5 mLs (1 mcg total) into the vein every Monday, Wednesday, and Friday with hemodialysis. 2 mL  Past Week at Unknown time  . fluticasone (FLONASE) 50 MCG/ACT nasal spray Place 1 spray into both nostrils daily as needed for allergies or rhinitis (congestion).    unk  . gabapentin (NEURONTIN) 100 MG capsule Take 100 mg by mouth in  the morning.   12/22/2020 at Unknown time  . Hydrocortisone Acetate 1 % OINT Apply 1 application topically as needed (inflammation).   Past Week at Unknown time  . insulin aspart (NOVOLOG) 100 UNIT/ML FlexPen Inject 6-8 Units into the skin 2 (two) times daily with a meal. 6 units in the evening, 8 units in the morning   12/22/2020 at Unknown time  . lanthanum (FOSRENOL) 1000 MG chewable tablet  Chew 2,000-4,000 mg by mouth See admin instructions. Chew 4 tablets (4000 mg) by mouth with each meal and 2 tablets (2000 mg) with snacks   Past Week at Unknown time  . multivitamin (RENA-VIT) TABS tablet Take 1 tablet by mouth daily.   12/22/2020 at Unknown time  . nystatin ointment (MYCOSTATIN) Apply topically 2 (two) times daily.   12/22/2020 at Unknown time  . omeprazole (PRILOSEC) 40 MG capsule Take 1 capsule (40 mg total) by mouth daily. 30 capsule 11 12/22/2020 at Unknown time  . ONETOUCH VERIO test strip 1 each by Other route 3 (three) times daily.   9 12/22/2020 at Unknown time  . OXYGEN Inhale 3 L into the lungs continuous.   12/22/2020 at Unknown time  . prednisoLONE acetate (PRED FORTE) 1 % ophthalmic suspension Place 1 drop into the right eye daily. For eye pain  0 Past Week at Unknown time  . traMADol (ULTRAM) 50 MG tablet Take 50 mg by mouth as needed for moderate pain.   Past Week at Unknown time  . traZODone (DESYREL) 100 MG tablet Take 100 mg by mouth at bedtime.   Past Week at Unknown time  . albuterol (PROVENTIL) (2.5 MG/3ML) 0.083% nebulizer solution Take 3 mLs (2.5 mg total) by nebulization every 6 (six) hours as needed for wheezing or shortness of breath. (Patient not taking: Reported on 12/23/2020) 75 mL 12 Not Taking at Unknown time  . clopidogrel (PLAVIX) 75 MG tablet TAKE 1 TABLET BY MOUTH EVERY DAY (Patient taking differently: Take 75 mg by mouth daily.) 90 tablet 3 unk  . LORazepam (ATIVAN) 0.5 MG tablet Take a 1/2 tablet 30 minutes before MRI (Patient not taking: Reported on 12/23/2020) 1 tablet 0 Not  Taking at Unknown time  . ondansetron (ZOFRAN) 4 MG tablet Take 1 tablet (4 mg total) by mouth every 8 (eight) hours as needed for nausea or vomiting. (Patient not taking: Reported on 12/23/2020) 30 tablet 1 Not Taking at Unknown time    Results for orders placed or performed during the hospital encounter of 12/22/20 (from the past 48 hour(s))  Basic metabolic panel     Status: Abnormal   Collection Time: 12/22/20 11:00 PM  Result Value Ref Range   Sodium 137 135 - 145 mmol/L   Potassium 3.9 3.5 - 5.1 mmol/L   Chloride 96 (L) 98 - 111 mmol/L   CO2 27 22 - 32 mmol/L   Glucose, Bld 108 (H) 70 - 99 mg/dL    Comment: Glucose reference range applies only to samples taken after fasting for at least 8 hours.   BUN 24 (H) 8 - 23 mg/dL   Creatinine, Ser 8.41 (H) 0.44 - 1.00 mg/dL   Calcium 9.1 8.9 - 10.3 mg/dL   GFR, Estimated 5 (L) >60 mL/min    Comment: (NOTE) Calculated using the CKD-EPI Creatinine Equation (2021)    Anion gap 14 5 - 15    Comment: Performed at [facility] [address] [place], [place] [address]  CBC with Differential     Status: Abnormal   Collection Time: 12/22/20 11:00 PM  Result Value Ref Range   WBC 9.8 4.0 - 10.5 K/uL   RBC 3.10 (L) 3.87 - 5.11 MIL/uL   Hemoglobin 9.7 (L) 12.0 - 15.0 g/dL   HCT 30.8 (L) 36.0 - 46.0 %   MCV 99.4 80.0 - 100.0 fL   MCH 31.3 26.0 - 34.0 pg   MCHC 31.5 30.0 - 36.0 g/dL  RDW 20.6 (H) 11.5 - 15.5 %   Platelets 235 150 - 400 K/uL   nRBC 0.0 0.0 - 0.2 %   Neutrophils Relative % 75 %   Neutro Abs 7.3 1.7 - 7.7 K/uL   Lymphocytes Relative 16 %   Lymphs Abs 1.5 0.7 - 4.0 K/uL   Monocytes Relative 7 %   Monocytes Absolute 0.7 0.1 - 1.0 K/uL   Eosinophils Relative 1 %   Eosinophils Absolute 0.1 0.0 - 0.5 K/uL   Basophils Relative 1 %   Basophils Absolute 0.1 0.0 - 0.1 K/uL   Immature Granulocytes 0 %   Abs Immature Granulocytes 0.04 0.00 - 0.07 K/uL    Comment: Performed at [facility] [address]  [place], [place] [address]  Resp Panel by RT-PCR (Flu A&B, Covid) Nasopharyngeal Swab     Status: None   Collection Time: 12/22/20 11:00 PM   Specimen: Nasopharyngeal Swab; Nasopharyngeal(NP) swabs in vial transport medium  Result Value Ref Range   SARS Coronavirus 2 by RT PCR NEGATIVE NEGATIVE    Comment: (NOTE) SARS-CoV-2 target nucleic acids are NOT DETECTED.  The SARS-CoV-2 RNA is generally detectable in upper respiratory specimens during the acute phase of infection. The lowest concentration of SARS-CoV-2 viral copies this assay can detect is 138 copies/mL. A negative result does not preclude SARS-Cov-2 infection and should not be used as the sole basis for treatment or other patient management decisions. A negative result may occur with  improper specimen collection/handling, submission of specimen other than nasopharyngeal swab, presence of viral mutation(s) within the areas targeted by this assay, and inadequate number of viral copies(<138 copies/mL). A negative result must be combined with clinical observations, patient history, and epidemiological information. The expected result is Negative.  Fact Sheet for Patients:  EntrepreneurPulse.com.au  Fact Sheet for Healthcare Providers:  IncredibleEmployment.be  This test is no t yet approved or cleared by the [place] FDA and  has been authorized for detection and/or diagnosis of SARS-CoV-2 by FDA under an Emergency Use Authorization (EUA). This EUA will remain  in effect (meaning this test can be used) for the duration of the COVID-19 declaration under Section 564(b)(1) of the Act, 21 U.S.C.section 360bbb-3(b)(1), unless the authorization is terminated  or revoked sooner.       Influenza A by PCR NEGATIVE NEGATIVE   Influenza B by PCR NEGATIVE NEGATIVE    Comment: (NOTE) The Xpert Xpress SARS-CoV-2/FLU/RSV plus assay is intended as an aid in the diagnosis of influenza from  Nasopharyngeal swab specimens and should not be used as a sole basis for treatment. Nasal washings and aspirates are unacceptable for Xpert Xpress SARS-CoV-2/FLU/RSV testing.  Fact Sheet for Patients: EntrepreneurPulse.com.au  Fact Sheet for Healthcare Providers: IncredibleEmployment.be  This test is not yet approved or cleared by the [place] FDA and has been authorized for detection and/or diagnosis of SARS-CoV-2 by FDA under an Emergency Use Authorization (EUA). This EUA will remain in effect (meaning this test can be used) for the duration of the COVID-19 declaration under Section 564(b)(1) of the Act, 21 U.S.C. section 360bbb-3(b)(1), unless the authorization is terminated or revoked.  Performed at [facility], [place] [address], [place], [place] [address]   HIV Antibody (routine testing w rflx)     Status: None   Collection Time: 12/23/20  2:44 AM  Result Value Ref Range   HIV Screen 4th Generation wRfx Non Reactive Non Reactive    Comment: Performed at [facility]  Lab, [address], [place], [place] [address]  Glucose, capillary     Status: Abnormal   Collection Time: 12/23/20  6:45 AM  Result Value Ref Range   Glucose-Capillary 105 (H) 70 - 99 mg/dL    Comment: Glucose reference range applies only to samples taken after fasting for at least 8 hours.  Glucose, capillary     Status: Abnormal   Collection Time: 12/23/20 11:33 AM  Result Value Ref Range   Glucose-Capillary 103 (H) 70 - 99 mg/dL    Comment: Glucose reference range applies only to samples taken after fasting for at least 8 hours.  Basic metabolic panel     Status: Abnormal   Collection Time: 12/23/20  3:08 PM  Result Value Ref Range   Sodium 139 135 - 145 mmol/L   Potassium 4.9 3.5 - 5.1 mmol/L   Chloride 98 98 - 111 mmol/L   CO2 25 22 - 32 mmol/L   Glucose, Bld 83 70 - 99 mg/dL    Comment: Glucose reference range applies only to samples taken after  fasting for at least 8 hours.   BUN 28 (H) 8 - 23 mg/dL   Creatinine, Ser 9.46 (H) 0.44 - 1.00 mg/dL   Calcium 9.0 8.9 - 10.3 mg/dL   GFR, Estimated 4 (L) >60 mL/min    Comment: (NOTE) Calculated using the CKD-EPI Creatinine Equation (2021)    Anion gap 16 (H) 5 - 15    Comment: Performed at [facility] [address] [place], [place] [address]  Glucose, capillary     Status: Abnormal   Collection Time: 12/23/20  3:54 PM  Result Value Ref Range   Glucose-Capillary 100 (H) 70 - 99 mg/dL    Comment: Glucose reference range applies only to samples taken after fasting for at least 8 hours.    DG Pelvis 1-2 Views  Result Date: 12/22/2020 CLINICAL DATA:  Fall, RIGHT hip pain. EXAM: PELVIS - 1-2 VIEW COMPARISON:  None. FINDINGS: Displaced fracture of the RIGHT femoral neck. Visualized portions of the osseous pelvis appear intact and normally aligned. Sacrum in the at bones partially obscured by overlying oral contrast material. Extensive vascular calcifications throughout the pelvis and upper thigh regions. IMPRESSION: Displaced fracture of the RIGHT femoral neck. Electronically Signed   By: Franki Cabot M.D.   On: 12/22/2020 22:40   DG Elbow 2 Views Left  Result Date: 12/22/2020 CLINICAL DATA:  Fall, RIGHT hip pain EXAM: LEFT ELBOW - 2 VIEW COMPARISON:  None FINDINGS: Osseous structures about the elbow appear intact and normally aligned. No fracture line or displaced fracture fragment. No appreciable joint effusion. Soft tissue swelling/edema posterior to the olecranon. Extensive vascular calcifications about the elbow. IMPRESSION: Soft tissue swelling/edema posterior to the olecranon. No osseous fracture or dislocation. Electronically Signed   By: Franki Cabot M.D.   On: 12/22/2020 22:41   DG Femur Min 2 Views Right  Result Date: 12/22/2020 CLINICAL DATA:  Fall, RIGHT hip pain. EXAM: RIGHT FEMUR 2 VIEWS COMPARISON:  None. FINDINGS: Displaced fracture within the RIGHT femoral neck,  intratrochanteric with involvement of the greater trochanter. Associated angulation deformity at the fracture site, nearly 90 degrees. Femoral head remains grossly well positioned relative to the acetabulum. Surgical amputation deformity of the mid RIGHT femur. Extensive vascular calcifications of the RIGHT upper thigh. IMPRESSION: Displaced fracture within the intertrochanteric RIGHT femoral neck with prominent angulation deformity at the fracture site. Electronically Signed   By: Roxy Horseman.D.  On: 12/22/2020 22:38    ROS as above but denies recent fever, bleeding abnormalities, urologic dysfunction, GI problems, or weight gain.  Blood pressure (!) 116/37, pulse 84, temperature 97.7 F (36.5 C), temperature source Oral, resp. rate 19, height 5\' 5"  (1.651 m), weight 75.5 kg, SpO2 98 %. Physical Exam Ranger, blind, very pleasant RLE No traumatic wounds, ecchymosis, or rash  Exquisitely tender  No knee or ankle effusion  Knee stable to varus/ valgus and anterior/posterior stress  Sens DPN, SPN, TN intact  Motor EHL, ext, flex, evers 5/5  DP 2+, PT 2+, No significant edema    Assessment/Plan: Right basocervical femur fracture with uncontrolled pain  I discussed with the patient and her son the risks and benefits of surgery, including the possibility of infection, nerve injury, vessel injury, wound breakdown, arthritis, symptomatic hardware, DVT/ PE, loss of motion, malunion, nonunion, and need for further surgery among others.  We also specifically discussed the need to stage surgery because of the elevated risk of soft tissue breakdown that could lead to amputation.  They acknowledged these risks and the son provided consent to proceed.   [place]e [place], MD [facility], [facility] [phone]  12/23/2020  5:01 PM

## 2020-12-23 NOTE — ED Notes (Signed)
Report called to RN receiving pt. 

## 2020-12-23 NOTE — Plan of Care (Signed)
  Problem: Activity: Goal: Ability to ambulate and perform ADLs will improve 12/23/2020 0756 by Mayme Genta, RN Outcome: Progressing 12/23/2020 0756 by Mayme Genta, RN Outcome: Progressing   Problem: Pain Management: Goal: Pain level will decrease Outcome: Progressing

## 2020-12-23 NOTE — Progress Notes (Signed)
Arrived to patients room  5N 21, placed pt on O2 and continuous pulse ox and called for RN/tech to assume care. After several minutes passed with no staff contact  we summoned a different nurse on the floor to assist. Consuelo Pandy, RN; made face to face contact with patient in room, received short report and was informed that Blanch Media had gotten full report on patient. Pt in no distress, resting quietly in bed. Met Blanch Media on her way to the room as we were leaving. 12/23/2020 @ 2050 Cyndi Bender, RN

## 2020-12-23 NOTE — Progress Notes (Signed)
Report called to short stay. 

## 2020-12-23 NOTE — Anesthesia Preprocedure Evaluation (Addendum)
Anesthesia Evaluation  Patient identified by MRN, date of birth, ID band Patient awake    Reviewed: Allergy & Precautions, NPO status , Patient's Chart, lab work & pertinent test results  Airway Mallampati: II  TM Distance: >3 FB Neck ROM: Full    Dental  (+) Poor Dentition, Missing   Pulmonary asthma ,  COPD inhaler,    Pulmonary exam normal        Cardiovascular hypertension, Pt. on medications + Peripheral Vascular Disease (s/p BKA) and +CHF   Rhythm:Regular Rate:Normal     Neuro/Psych  Headaches, Anxiety    GI/Hepatic Neg liver ROS, PUD, GERD  Medicated,  Endo/Other  diabetes, Type 2, Insulin Dependent  Renal/GU ESRF and DialysisRenal disease  negative genitourinary   Musculoskeletal  (+) Arthritis , Osteoarthritis,  Fibromyalgia -  Abdominal (+)  Abdomen: soft. Bowel sounds: normal.  Peds  Hematology  (+) anemia ,   Anesthesia Other Findings   Reproductive/Obstetrics                            Anesthesia Physical Anesthesia Plan  ASA: III  Anesthesia Plan: General   Post-op Pain Management:    Induction: Intravenous  PONV Risk Score and Plan: 3 and Ondansetron, Treatment may vary due to age or medical condition and Dexamethasone  Airway Management Planned: Mask and Oral ETT  Additional Equipment: None  Intra-op Plan:   Post-operative Plan: Extubation in OR  Informed Consent: I have reviewed the patients History and Physical, chart, labs and discussed the procedure including the risks, benefits and alternatives for the proposed anesthesia with the patient or authorized representative who has indicated his/her understanding and acceptance.     Dental advisory given  Plan Discussed with: CRNA  Anesthesia Plan Comments: (Lab Results      Component                Value               Date                      WBC                      9.8                 12/22/2020                 HGB                      9.7 (L)             12/22/2020                HCT                      30.8 (L)            12/22/2020                MCV                      99.4                12/22/2020                PLT  235                 12/22/2020           Lab Results      Component                Value               Date                      NA                       137                 12/22/2020                K                        3.9                 12/22/2020                CO2                      27                  12/22/2020                GLUCOSE                  108 (H)             12/22/2020                BUN                      24 (H)              12/22/2020                CREATININE               8.41 (H)            12/22/2020                CALCIUM                  9.1                 12/22/2020                GFRNONAA                 5 (L)               12/22/2020                GFRAA                    6 (L)               12/20/2019          )        Anesthesia Quick Evaluation

## 2020-12-23 NOTE — Consult Note (Signed)
[facility] Renal Consultation Note    Indication for Consultation:  Management of ESRD/hemodialysis; anemia, hypertension/volume and secondary hyperparathyroidism  HPI: [name] is a 70 y.o. female with ESRD on HD MWF, DMT2, HTN, CHF, PAD s/p bilateral AKA, blindness. She is admitted with a right hip fracture following a fall at home. She fell out of her wheelchair. Xray reveals displaced intertrochanteric right femoral neck fracture. Orthopedics consulted. Labs Na, 137, K 3.9, BUN 24 Cr 8.4. Hgb 9.7.   Dialyzes MWF at [facility]. Last dialysis was 6/1. She presented for dialysis on Friday but reports there was a problem with water system so did not get dialysis.  She endorses some hip pain and is wanting something to eat, but denies cp, sob, n/v,d.   Past Medical History:  Diagnosis Date  . Anemia   . Anemia   . Anxiety   . Arthritis    "joints" (06/15/2013)  . Asthma   . 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 Blood transfusion without reported diagnosis   . Breast cancer ([place])    left  . CHF (congestive heart failure) ([place])   . 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 Esophageal ulcer with bleeding   . ESRD (end stage renal disease) ([place])    "suppose to start dialysis today" (06/15/2013)  . Family history of adverse reaction to anesthesia    It took a while for pt sister to wake from anesthesia  . GERD (gastroesophageal reflux disease)   . GI bleed   . Glaucoma    blind in both eyes  . Heart murmur    Mild AS, moderate MR, moderate TR 10/30/16 echo  . Hx of adenomatous colonic polyps 04/07/2018  . Hypertension   . Myalgia 12/31/2011  . Neuropathy 12/31/2011  . PAD (peripheral artery disease) ([facility])    nonviable tissue left lower extremity  . Shortness of breath    "when she doesn't go to dialysis"  . Type II diabetes mellitus ([place])    Type II   Past Surgical History:  Procedure Laterality Date  . A/V  FISTULAGRAM N/A 09/21/2018   Procedure: A/V FISTULAGRAM - Right Upper;  Surgeon: [name], MD;  Location: [place] [facility];  Service: Cardiovascular;  Laterality: N/A;  . ABDOMINAL AORTOGRAM N/A 11/30/2018   Procedure: ABDOMINAL AORTOGRAM;  Surgeon: [name], MD;  Location: [place] [facility];  Service: Cardiovascular;  Laterality: N/A;  . ABDOMINAL AORTOGRAM W/LOWER EXTREMITY Left 07/29/2019   Procedure: ABDOMINAL AORTOGRAM W/LOWER EXTREMITY;  Surgeon: [name], MD;  Location: [place] [facility];  Service: Cardiovascular;  Laterality: Left;  . ABDOMINAL HYSTERECTOMY     partial  . AMPUTATION Left 08/30/2019   Procedure: AMPUTATION BELOW KNEE LEFT;  Surgeon: [name], MD;  Location: [facility] OR;  Service: Vascular;  Laterality: Left;  . AMPUTATION Left 10/21/2019   Procedure: Amputation Above Knee;  Surgeon: Rosetta Posner, MD;  Location: [place];  Service: Vascular;  Laterality: Left;  . AMPUTATION Right 11/17/2019   Procedure: AMPUTATION ABOVE KNEE, right leg;  Surgeon: Rosetta Posner, MD;  Location: [place];  Service: Vascular;  Laterality: Right;  . AV FISTULA PLACEMENT Right 06/06/2013   Procedure: ARTERIOVENOUS (AV) FISTULA CREATION-RIGHT BRACHIAL CEPHALIC;  Surgeon: Conrad Windham, MD;  Location: [place];  Service: Vascular;  Laterality: Right;  . BREAST BIOPSY Left   . BREAST LUMPECTOMY Left    "  and took out some lymph nodes" (06/15/2013)  . CARDIAC CATHETERIZATION     04/02/15 [facility]): no angiographic CAD, LVEF 40% with global hypokinesis (LHC done for + stress echo, EF 45% 11/28/14)  . CARPAL TUNNEL RELEASE Left 11/26/2017   Procedure: LEFT CARPAL TUNNEL RELEASE;  Surgeon: [name], MD;  Location: [place];  Service: Orthopedics;  Laterality: Left;  . CATARACT EXTRACTION W/ ANTERIOR VITRECTOMY Bilateral   . CESAREAN SECTION  1980  . COLONOSCOPY    . COLONOSCOPY WITH PROPOFOL N/A 05/25/2020   Procedure: COLONOSCOPY WITH PROPOFOL;  Surgeon: Yetta Flock,  MD;  Location: [place];  Service: Gastroenterology;  Laterality: N/A;  . ENTEROSCOPY N/A 12/18/2019   Procedure: ENTEROSCOPY;  Surgeon: Rush Landmark Telford Nab., MD;  Location: [place];  Service: Gastroenterology;  Laterality: N/A;  . ENTEROSCOPY N/A 05/24/2020   Procedure: ENTEROSCOPY;  Surgeon: Jackquline Denmark, MD;  Location: [facility] ENDOSCOPY;  Service: Endoscopy;  Laterality: N/A;  . ESOPHAGOGASTRODUODENOSCOPY N/A 07/08/2017   Procedure: ESOPHAGOGASTRODUODENOSCOPY (EGD);  Surgeon: [name], MD;  Location: [facility] ENDOSCOPY;  Service: Endoscopy;  Laterality: N/A;  . ESOPHAGOGASTRODUODENOSCOPY (EGD) WITH PROPOFOL N/A 02/07/2018   Procedure: ESOPHAGOGASTRODUODENOSCOPY (EGD) WITH PROPOFOL;  Surgeon: [name], MD;  Location: [facility] ENDOSCOPY;  Service: Gastroenterology;  Laterality: N/A;  APC and clips placed  . EYE SURGERY Bilateral    laser surgery  . GIVENS CAPSULE STUDY N/A 12/19/2019   Procedure: GIVENS CAPSULE STUDY;  Surgeon: Irving Copas., MD;  Location: [place];  Service: Gastroenterology;  Laterality: N/A;  . HOT HEMOSTASIS N/A 12/18/2019   Procedure: HOT HEMOSTASIS (ARGON PLASMA COAGULATION/BICAP);  Surgeon: Irving Copas., MD;  Location: [place];  Service: Gastroenterology;  Laterality: N/A;  . HOT HEMOSTASIS Right 05/24/2020   Procedure: HOT HEMOSTASIS (ARGON PLASMA COAGULATION/BICAP);  Surgeon: Jackquline Denmark, MD;  Location: [facility] ENDOSCOPY;  Service: Endoscopy;  Laterality: Right;  r colon  . LOWER EXTREMITY ANGIOGRAPHY Bilateral 11/30/2018   Procedure: LOWER EXTREMITY ANGIOGRAPHY;  Surgeon: [name], MD;  Location: [place] CV LAB;  Service: Cardiovascular;  Laterality: Bilateral;  . PARS PLANA VITRECTOMY Right 05/05/2017   Procedure: PARS PLANA VITRECTOMY WITH 25 GAUGE; PARTIAL REMOVAL OF OIL; INFERIOR PERIPHERAL IRIDECTOMY, REFORM ANTERIOR CHAMBER RIGHT EYE;  Surgeon: [name], MD;  Location: [place];  Service: Ophthalmology;  Laterality:  Right;  . PERIPHERAL VASCULAR BALLOON ANGIOPLASTY Right 09/21/2018   Procedure: PERIPHERAL VASCULAR BALLOON ANGIOPLASTY;  Surgeon: [name], MD;  Location: [place] [facility] LAB;  Service: Cardiovascular;  Laterality: Right;  AV fistula  . PERIPHERAL VASCULAR BALLOON ANGIOPLASTY Left 11/30/2018   Procedure: PERIPHERAL VASCULAR BALLOON ANGIOPLASTY;  Surgeon: [name], MD;  Location: [place] [facility] LAB;  Service: Cardiovascular;  Laterality: Left;  Left Anterior Tibial Artery  . POLYPECTOMY  05/25/2020   Procedure: POLYPECTOMY;  Surgeon: Yetta Flock, MD;  Location: [facility] ENDOSCOPY;  Service: Gastroenterology;;  . REFRACTIVE SURGERY Bilateral   . REMOVAL OF A DIALYSIS CATHETER Right 06/06/2013   Procedure: REMOVAL OF RIGHT MEDIPORT;  Surgeon: Conrad Mooresburg, MD;  Location: [place];  Service: Vascular;  Laterality: Right;  . SUBMUCOSAL TATTOO INJECTION  12/18/2019   Procedure: SUBMUCOSAL TATTOO INJECTION;  Surgeon: Irving Copas., MD;  Location: [place];  Service: Gastroenterology;;  . TONSILLECTOMY    . UPPER GASTROINTESTINAL ENDOSCOPY    . WOUND DEBRIDEMENT Left 09/30/2019   Procedure: IRRIGATION AND DEBRIDEMENT WOUND OF LEFT BELOW KNEE AMPUTATION;  Surgeon: [name], MD;  Location:  [facility] OR;  Service: Vascular;  Laterality: Left;   Family History  Problem Relation Age of Onset  . Diabetes Mother   . Hyperlipidemia Mother   . Hypertension Mother   . Hypertension Father   . Diabetes Sister   . Diabetes Brother   . Hypertension Brother   . Heart attack Brother   . Kidney disease Brother   . Colon cancer Neg Hx   . Colon polyps Neg Hx   . Esophageal cancer Neg Hx   . Gallbladder disease Neg Hx   . Rectal cancer Neg Hx   . Stomach cancer Neg Hx    Social History:  reports that she has never smoked. She has never used smokeless tobacco. She reports that she does not drink alcohol and does not use drugs. Allergies  Allergen Reactions  . Tape Itching and  Rash    59M Transpore adhesive tape. Medical tape pulls off the skin!! PAPER TAPE ONLY, PLEASE  . Latex Hives  . Oxycodone Other (See Comments)    Hallucinations   . Tramadol Other (See Comments)    Hallucinations with a full tablet  . Aspirin Other (See Comments)    Trouble with stomach bleeding  . Morphine And Related Other (See Comments)    Hallucinations   . Vicodin [Hydrocodone-Acetaminophen] Other (See Comments)    Hallucinations    Prior to Admission medications   Medication Sig Start Date End Date Taking? Authorizing Provider  acetaminophen (TYLENOL) 500 MG tablet Take 500 mg by mouth every 6 (six) hours as needed for headache (pain).    [provider]  albuterol (PROVENTIL) (2.5 MG/59ML) 0.083% nebulizer solution Take 3 mLs (2.5 mg total) by nebulization every 6 (six) hours as needed for wheezing or shortness of breath. 10/30/16   Theodis Blaze, MD  albuterol (VENTOLIN HFA) 108 (90 Base) MCG/ACT inhaler Inhale 2 puffs into the lungs every 6 (six) hours as needed for wheezing or shortness of breath.     [provider]  atropine 1 % ophthalmic solution Place 1 drop into the right eye 2 (two) times daily as needed (dry eyes).  07/04/17   [provider]  BD PEN NEEDLE NANO 2ND GEN 32G X 4 MM MISC in the morning, at noon, and at bedtime.  03/03/19   [provider]  clopidogrel (PLAVIX) 75 MG tablet TAKE 1 TABLET BY MOUTH EVERY DAY 12/01/20   Waynetta Sandy, MD  darbepoetin [facility]) 200 MCG/0.4ML SOLN injection Inject 0.4 mLs (200 mcg total) into the vein every Wednesday with hemodialysis. 06/22/13   Geradine Girt, DO  diclofenac sodium (VOLTAREN) 1 % GEL Apply 2 g topically 4 (four) times daily as needed (pain).  04/28/19   [provider]  diphenhydrAMINE (BENADRYL) 25 MG tablet Take 25 mg by mouth See admin instructions. Take one tablet (25 mg) by mouth daily at bedtime, and take one tablet (25 mg) by mouth on Monday, Wednesday,  Friday mornings before dialysis    [provider]  doxercalciferol (HECTOROL) 4 MCG/2ML injection Inject 0.5 mLs (1 mcg total) into the vein every Monday, Wednesday, and Friday with hemodialysis. 06/18/13   Geradine Girt, DO  fluticasone (FLONASE) 50 MCG/ACT nasal spray Place 1 spray into both nostrils daily as needed for allergies or rhinitis (congestion).  06/08/20   [provider]  gabapentin (NEURONTIN) 100 MG capsule Take 100 mg by mouth 2 (two) times daily.    [provider]  insulin aspart (NOVOLOG) 100  UNIT/ML FlexPen Inject 2-10 Units into the skin 2 (two) times daily with a meal. Per CBG    [provider]  lanthanum (FOSRENOL) 1000 MG chewable tablet Chew 2,000-4,000 mg by mouth See admin instructions. Chew 4 tablets (4000 mg) by mouth with each meal and 2 tablets (2000 mg) with snacks    [provider]  LORazepam (ATIVAN) 0.5 MG tablet Take a 1/2 tablet 30 minutes before MRI 06/11/20   Esterwood, Amy S, PA-C  multivitamin (RENA-VIT) TABS tablet Take 1 tablet by mouth daily. 12/20/19   [provider]  omeprazole (PRILOSEC) 40 MG capsule Take 1 capsule (40 mg total) by mouth daily. 06/11/20   Esterwood, Amy S, PA-C  ondansetron (ZOFRAN) 4 MG tablet Take 1 tablet (4 mg total) by mouth every 8 (eight) hours as needed for nausea or vomiting. 08/15/20   [name], MD  [facility] VERIO test strip 1 each by Other route 3 (three) times daily.  12/15/17   [provider]  OXYGEN Inhale 2 L into the lungs continuous.    [provider]  prednisoLONE acetate (PRED FORTE) 1 % ophthalmic suspension Place 1 drop into the right eye daily. For eye pain 04/27/17   [provider]   Current Facility-Administered Medications  Medication Dose Route Frequency Provider Last Rate Last Admin  . albuterol (PROVENTIL) (2.5 MG/3ML) 0.083% nebulizer solution 3 mL  3 mL Inhalation Q4H PRN Chotiner, Yevonne Aline, MD      .  HYDROcodone-acetaminophen (NORCO/VICODIN) 5-325 MG per tablet 1-2 tablet  1-2 tablet Oral Q6H PRN Chotiner, Yevonne Aline, MD   2 tablet at 12/23/20 0240  . HYDROmorphone (DILAUDID) injection 1 mg  1 mg Intravenous Q3H PRN Chotiner, Yevonne Aline, MD      . insulin aspart (novoLOG) injection 0-6 Units  0-6 Units Subcutaneous Q6H PRN Chotiner, Yevonne Aline, MD      . pantoprazole (PROTONIX) EC tablet 40 mg  40 mg Oral Daily Chotiner, Yevonne Aline, MD   40 mg at 12/23/20 0836  . senna-docusate (Senokot-S) tablet 1 tablet  1 tablet Oral QHS PRN Chotiner, Yevonne Aline, MD         ROS: As per HPI otherwise negative.  Physical Exam: Vitals:   12/22/20 2306 12/23/20 0252 12/23/20 0443 12/23/20 0732  BP: (!) 111/46 (!) 111/24  (!) 113/31  Pulse: 79 79  81  Resp: 18 16  14   Temp: 97.9 F (36.6 C) 98.3 F (36.8 C)  97.7 F (36.5 C)  TempSrc: Oral Oral  Oral  SpO2: 100% 97%  100%  Weight:   75.5 kg   Height:         General: Well developed woman, nad, on nasal oxygen  HEENT: NCAT, blind  Neck: Supple. No JVD appreciated  Lungs: Clear bilaterally. No wheeze or rales  Heart: RRR soft systolic murmur  Abdomen: soft non-tender  Lower extremities: bilateral AKAs, no sig edema  Neuro: A & O  X 3. Moves all extremities spontaneously. Psych:  Responds to questions appropriately with a normal affect. Dialysis Access: RUE AVF +bruit   Labs: Basic Metabolic Panel: Recent Labs  Lab 12/22/20 2300  NA 137  K 3.9  CL 96*  CO2 27  GLUCOSE 108*  BUN 24*  CREATININE 8.41*  CALCIUM 9.1   Liver Function Tests: No results for input(s): AST, ALT, ALKPHOS, BILITOT, PROT, ALBUMIN in the last 168 hours. No results for input(s): LIPASE, AMYLASE in the last 168 hours. No results for  input(s): AMMONIA in the last 168 hours. CBC: Recent Labs  Lab 12/22/20 2300  WBC 9.8  NEUTROABS 7.3  HGB 9.7*  HCT 30.8*  MCV 99.4  PLT 235   Cardiac Enzymes: No results for input(s): CKTOTAL, CKMB, CKMBINDEX, TROPONINI in  the last 168 hours. CBG: Recent Labs  Lab 12/23/20 0645  GLUCAP 105*   Iron Studies: No results for input(s): IRON, TIBC, TRANSFERRIN, FERRITIN in the last 72 hours. Studies/Results: DG Pelvis 1-2 Views  Result Date: 12/22/2020 CLINICAL DATA:  Fall, RIGHT hip pain. EXAM: PELVIS - 1-2 VIEW COMPARISON:  None. FINDINGS: Displaced fracture of the RIGHT femoral neck. Visualized portions of the osseous pelvis appear intact and normally aligned. Sacrum in the at bones partially obscured by overlying oral contrast material. Extensive vascular calcifications throughout the pelvis and upper thigh regions. IMPRESSION: Displaced fracture of the RIGHT femoral neck. Electronically Signed   By: Franki Cabot M.D.   On: 12/22/2020 22:40   DG Elbow 2 Views Left  Result Date: 12/22/2020 CLINICAL DATA:  Fall, RIGHT hip pain EXAM: LEFT ELBOW - 2 VIEW COMPARISON:  None FINDINGS: Osseous structures about the elbow appear intact and normally aligned. No fracture line or displaced fracture fragment. No appreciable joint effusion. Soft tissue swelling/edema posterior to the olecranon. Extensive vascular calcifications about the elbow. IMPRESSION: Soft tissue swelling/edema posterior to the olecranon. No osseous fracture or dislocation. Electronically Signed   By: Franki Cabot M.D.   On: 12/22/2020 22:41   DG Femur Min 2 Views Right  Result Date: 12/22/2020 CLINICAL DATA:  Fall, RIGHT hip pain. EXAM: RIGHT FEMUR 2 VIEWS COMPARISON:  None. FINDINGS: Displaced fracture within the RIGHT femoral neck, intratrochanteric with involvement of the greater trochanter. Associated angulation deformity at the fracture site, nearly 90 degrees. Femoral head remains grossly well positioned relative to the acetabulum. Surgical amputation deformity of the mid RIGHT femur. Extensive vascular calcifications of the RIGHT upper thigh. IMPRESSION: Displaced fracture within the intertrochanteric RIGHT femoral neck with prominent angulation  deformity at the fracture site. Electronically Signed   By: Franki Cabot M.D.   On: 12/22/2020 22:38    Dialysis Orders:  [facility] MWF 4h  400/500 72.5kg 2K/2Ca bath UFP 2 AVF No heparin  Hectorol 3 TIW, Venofer 50 q wk, Mircera 225 q 2 weeks (last on 5/30)   Assessment/Plan: 1. R hip fx - management per ortho service  2. ESRD -  HD MWF. Missed Friday but no urgent indication today. HD Monday on schedule.  3. Hypertension/volume  - BP controlled. No volume excess on exam. UF to dry weight as tolerated 4. Anemia  - Hgb 9.7. Recent ESA dose as outpatient. Follow trends  5. Metabolic bone disease -  Ca ok. Continue Fosrenol binder, Hectorol  6. DMT2 -insulin per primary team  7. Nutrition - Renal diet w fluid restrictions   Lynnda Child PA-C [facility] 12/23/2020, 11:15 AM

## 2020-12-23 NOTE — H&P (Signed)
History and Physical    [name] YHC:[id number] DOB: 21-Jan-1951 DOA: 12/22/2020  PCP: Prince Solian, MD   Patient coming from: Home  Chief Complaint: Right hip pain after fall at home.   HPI: [name] is a 70 y.o. female with medical history significant for ESRD on HD Monday, Wednesday and Fridays, DMT2, HTN, CHF, HLD, blindness due to glaucoma, bilateral AKA status. She presents after a fall at home.  She reports she was sitting in her wheelchair at the kitchen table when she fell asleep.  She thought she had the wheelchair locked but it was not as it started to roll back and she fell out of the wheelchair landing on her right side.  She had instantaneous severe pain in her right leg and she was not able to get up she tried to scoot across the floor and hit her left elbow on the ground a few times causing a small cut.  She did not have any head trauma and had no loss of consciousness.  She denies any abdominal pain, nausea, vomiting, diarrhea.  She did not have any chest pain, shortness of breath, palpitations, fever or chills.  She went to dialysis on Friday but unfortunately the dialysis center had a water line break and she was unable to complete her dialysis session.  ED Course: In the emergency room she has been hemodynamically stable.  She is found to have a right femoral neck fracture that is displaced.  Orthopedic was consulted by the ER physician and will see patient to evaluate and plan for definitive treatment.  CBC is unremarkable.  Is 137, potassium 3.9, chloride 96, bicarb 27, glucose 108, creatinine 8.41, BUN 24.  Hospitalist service was asked to admit for further management  Review of Systems:  General: Denies  fever, chills, weight loss, night sweats.  Denies dizziness.  Denies change in appetite HENT: Denies head trauma, headache, denies change in hearing, tinnitus. Denies nasal bleeding. Denies sore throat.  Denies difficulty swallowing Neck: Denies pain.   Denies swelling.  Denies pain with movement. Cardiovascular: Denies chest pain, palpitations.  Denies edema.  Denies orthopnea Respiratory: Denies shortness of breath, cough.  Denies wheezing.  Denies sputum production Gastrointestinal: Denies abdominal pain, swelling.  Denies nausea, vomiting, diarrhea.  Denies melena.  Denies hematemesis. Musculoskeletal: Reports pain in right hip. Has pain in left elbow-able to move left arm normally.  Genitourinary: Denies pelvic pain.  Denies urinary frequency or hesitancy.  Denies dysuria.  Skin: Denies rash.  Denies petechiae, purpura, ecchymosis. Neurological: Denies syncope. Denies seizure activity. Denies paresthesia. Denies slurred speech, drooping face.  Psychiatric: Denies depression, anxiety.   Denies hallucinations.  Past Medical History:  Diagnosis Date  . Anemia   . Anemia   . Anxiety   . Arthritis    "joints" (06/15/2013)  . Asthma   . 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 Blood transfusion without reported diagnosis   . Breast cancer ([place])    left  . CHF (congestive heart failure) ([place])   . 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 Esophageal ulcer with bleeding   . ESRD (end stage renal disease) ([place])    "suppose to start dialysis today" (06/15/2013)  . Family history of adverse reaction to anesthesia    It took a while for pt sister to wake from anesthesia  . GERD (gastroesophageal reflux disease)   . GI bleed   . Glaucoma    blind  in both eyes  . Heart murmur    Mild AS, moderate MR, moderate TR 10/30/16 echo  . Hx of adenomatous colonic polyps 04/07/2018  . Hypertension   . Myalgia 12/31/2011  . Neuropathy 12/31/2011  . PAD (peripheral artery disease) ([facility])    nonviable tissue left lower extremity  . Shortness of breath    "when she doesn't go to dialysis"  . Type II diabetes mellitus ([place])    Type II    Past Surgical History:  Procedure Laterality Date  . A/V FISTULAGRAM N/A 09/21/2018    Procedure: A/V FISTULAGRAM - Right Upper;  Surgeon: [name], MD;  Location: [place] [facility];  Service: Cardiovascular;  Laterality: N/A;  . ABDOMINAL AORTOGRAM N/A 11/30/2018   Procedure: ABDOMINAL AORTOGRAM;  Surgeon: [name], MD;  Location: [place] [facility];  Service: Cardiovascular;  Laterality: N/A;  . ABDOMINAL AORTOGRAM W/LOWER EXTREMITY Left 07/29/2019   Procedure: ABDOMINAL AORTOGRAM W/LOWER EXTREMITY;  Surgeon: [name], MD;  Location: [place] [facility];  Service: Cardiovascular;  Laterality: Left;  . ABDOMINAL HYSTERECTOMY     partial  . AMPUTATION Left 08/30/2019   Procedure: AMPUTATION BELOW KNEE LEFT;  Surgeon: [name], MD;  Location: [facility] OR;  Service: Vascular;  Laterality: Left;  . AMPUTATION Left 10/21/2019   Procedure: Amputation Above Knee;  Surgeon: Rosetta Posner, MD;  Location: [place];  Service: Vascular;  Laterality: Left;  . AMPUTATION Right 11/17/2019   Procedure: AMPUTATION ABOVE KNEE, right leg;  Surgeon: Rosetta Posner, MD;  Location: [place];  Service: Vascular;  Laterality: Right;  . AV FISTULA PLACEMENT Right 06/06/2013   Procedure: ARTERIOVENOUS (AV) FISTULA CREATION-RIGHT BRACHIAL CEPHALIC;  Surgeon: Conrad Leroy, MD;  Location: [place];  Service: Vascular;  Laterality: Right;  . BREAST BIOPSY Left   . BREAST LUMPECTOMY Left    "and took out some lymph nodes" (06/15/2013)  . CARDIAC CATHETERIZATION     04/02/15 [facility]): no angiographic CAD, LVEF 40% with global hypokinesis (LHC done for + stress echo, EF 45% 11/28/14)  . CARPAL TUNNEL RELEASE Left 11/26/2017   Procedure: LEFT CARPAL TUNNEL RELEASE;  Surgeon: [name], MD;  Location: [place];  Service: Orthopedics;  Laterality: Left;  . CATARACT EXTRACTION W/ ANTERIOR VITRECTOMY Bilateral   . CESAREAN SECTION  1980  . COLONOSCOPY    . COLONOSCOPY WITH PROPOFOL N/A 05/25/2020   Procedure: COLONOSCOPY WITH PROPOFOL;  Surgeon: Yetta Flock, MD;  Location: [place];   Service: Gastroenterology;  Laterality: N/A;  . ENTEROSCOPY N/A 12/18/2019   Procedure: ENTEROSCOPY;  Surgeon: Rush Landmark Telford Nab., MD;  Location: [place];  Service: Gastroenterology;  Laterality: N/A;  . ENTEROSCOPY N/A 05/24/2020   Procedure: ENTEROSCOPY;  Surgeon: Jackquline Denmark, MD;  Location: [facility] ENDOSCOPY;  Service: Endoscopy;  Laterality: N/A;  . ESOPHAGOGASTRODUODENOSCOPY N/A 07/08/2017   Procedure: ESOPHAGOGASTRODUODENOSCOPY (EGD);  Surgeon: [name], MD;  Location: [facility] ENDOSCOPY;  Service: Endoscopy;  Laterality: N/A;  . ESOPHAGOGASTRODUODENOSCOPY (EGD) WITH PROPOFOL N/A 02/07/2018   Procedure: ESOPHAGOGASTRODUODENOSCOPY (EGD) WITH PROPOFOL;  Surgeon: [name], MD;  Location: [facility] ENDOSCOPY;  Service: Gastroenterology;  Laterality: N/A;  APC and clips placed  . EYE SURGERY Bilateral    laser surgery  . GIVENS CAPSULE STUDY N/A 12/19/2019   Procedure: GIVENS CAPSULE STUDY;  Surgeon: Irving Copas., MD;  Location: [place];  Service: Gastroenterology;  Laterality: N/A;  . HOT HEMOSTASIS N/A 12/18/2019   Procedure: HOT HEMOSTASIS (ARGON PLASMA  COAGULATION/BICAP);  Surgeon: Irving Copas., MD;  Location: [place];  Service: Gastroenterology;  Laterality: N/A;  . HOT HEMOSTASIS Right 05/24/2020   Procedure: HOT HEMOSTASIS (ARGON PLASMA COAGULATION/BICAP);  Surgeon: Jackquline Denmark, MD;  Location: [facility] ENDOSCOPY;  Service: Endoscopy;  Laterality: Right;  r colon  . LOWER EXTREMITY ANGIOGRAPHY Bilateral 11/30/2018   Procedure: LOWER EXTREMITY ANGIOGRAPHY;  Surgeon: [name], MD;  Location: [place] [facility] LAB;  Service: Cardiovascular;  Laterality: Bilateral;  . PARS PLANA VITRECTOMY Right 05/05/2017   Procedure: PARS PLANA VITRECTOMY WITH 25 GAUGE; PARTIAL REMOVAL OF OIL; INFERIOR PERIPHERAL IRIDECTOMY, REFORM ANTERIOR CHAMBER RIGHT EYE;  Surgeon: [name], MD;  Location: [place];  Service: Ophthalmology;  Laterality: Right;  . PERIPHERAL VASCULAR  BALLOON ANGIOPLASTY Right 09/21/2018   Procedure: PERIPHERAL VASCULAR BALLOON ANGIOPLASTY;  Surgeon: [name], MD;  Location: [place] CV LAB;  Service: Cardiovascular;  Laterality: Right;  AV fistula  . PERIPHERAL VASCULAR BALLOON ANGIOPLASTY Left 11/30/2018   Procedure: PERIPHERAL VASCULAR BALLOON ANGIOPLASTY;  Surgeon: [name], MD;  Location: [place] CV LAB;  Service: Cardiovascular;  Laterality: Left;  Left Anterior Tibial Artery  . POLYPECTOMY  05/25/2020   Procedure: POLYPECTOMY;  Surgeon: Yetta Flock, MD;  Location: [facility] ENDOSCOPY;  Service: Gastroenterology;;  . REFRACTIVE SURGERY Bilateral   . REMOVAL OF A DIALYSIS CATHETER Right 06/06/2013   Procedure: REMOVAL OF RIGHT MEDIPORT;  Surgeon: Conrad [place], MD;  Location: [place];  Service: Vascular;  Laterality: Right;  . SUBMUCOSAL TATTOO INJECTION  12/18/2019   Procedure: SUBMUCOSAL TATTOO INJECTION;  Surgeon: Irving Copas., MD;  Location: [place];  Service: Gastroenterology;;  . TONSILLECTOMY    . UPPER GASTROINTESTINAL ENDOSCOPY    . WOUND DEBRIDEMENT Left 09/30/2019   Procedure: IRRIGATION AND DEBRIDEMENT WOUND OF LEFT BELOW KNEE AMPUTATION;  Surgeon: [name], MD;  Location: [facility] OR;  Service: Vascular;  Laterality: Left;    Social History  reports that she has never smoked. She has never used smokeless tobacco. She reports that she does not drink alcohol and does not use drugs.  Allergies  Allergen Reactions  . Tape Itching and Rash    75M Transpore adhesive tape. Medical tape pulls off the skin!! PAPER TAPE ONLY, PLEASE  . Latex Hives  . Oxycodone Other (See Comments)    Hallucinations   . Tramadol Other (See Comments)    Hallucinations with a full tablet  . Aspirin Other (See Comments)    Trouble with stomach bleeding  . Morphine And Related Other (See Comments)    Hallucinations   . Vicodin [Hydrocodone-Acetaminophen] Other (See Comments)    Hallucinations     Family  History  Problem Relation Age of Onset  . Diabetes Mother   . Hyperlipidemia Mother   . Hypertension Mother   . Hypertension Father   . Diabetes Sister   . Diabetes Brother   . Hypertension Brother   . Heart attack Brother   . Kidney disease Brother   . Colon cancer Neg Hx   . Colon polyps Neg Hx   . Esophageal cancer Neg Hx   . Gallbladder disease Neg Hx   . Rectal cancer Neg Hx   . Stomach cancer Neg Hx      Prior to Admission medications   Medication Sig Start Date End Date Taking? Authorizing Provider  acetaminophen (TYLENOL) 500 MG tablet Take 500 mg by mouth every 6 (six) hours as needed for headache (pain).  [provider]  albuterol (PROVENTIL) (2.5 MG/3ML) 0.083% nebulizer solution Take 3 mLs (2.5 mg total) by nebulization every 6 (six) hours as needed for wheezing or shortness of breath. 10/30/16   Theodis Blaze, MD  albuterol (VENTOLIN HFA) 108 (90 Base) MCG/ACT inhaler Inhale 2 puffs into the lungs every 6 (six) hours as needed for wheezing or shortness of breath.     [provider]  atropine 1 % ophthalmic solution Place 1 drop into the right eye 2 (two) times daily as needed (dry eyes).  07/04/17   [provider]  BD PEN NEEDLE NANO 2ND GEN 32G X 4 MM MISC in the morning, at noon, and at bedtime.  03/03/19   [provider]  clopidogrel (PLAVIX) 75 MG tablet TAKE 1 TABLET BY MOUTH EVERY DAY 12/01/20   Waynetta Sandy, MD  darbepoetin [facility]) 200 MCG/0.4ML SOLN injection Inject 0.4 mLs (200 mcg total) into the vein every Wednesday with hemodialysis. 06/22/13   Geradine Girt, DO  diclofenac sodium (VOLTAREN) 1 % GEL Apply 2 g topically 4 (four) times daily as needed (pain).  04/28/19   [provider]  diphenhydrAMINE (BENADRYL) 25 MG tablet Take 25 mg by mouth See admin instructions. Take one tablet (25 mg) by mouth daily at bedtime, and take one tablet (25 mg) by mouth on Monday, Wednesday, Friday mornings before  dialysis    [provider]  doxercalciferol (HECTOROL) 4 MCG/2ML injection Inject 0.5 mLs (1 mcg total) into the vein every Monday, Wednesday, and Friday with hemodialysis. 06/18/13   Geradine Girt, DO  fluticasone (FLONASE) 50 MCG/ACT nasal spray Place 1 spray into both nostrils daily as needed for allergies or rhinitis (congestion).  06/08/20   [provider]  gabapentin (NEURONTIN) 100 MG capsule Take 100 mg by mouth 2 (two) times daily.    [provider]  insulin aspart (NOVOLOG) 100 UNIT/ML FlexPen Inject 2-10 Units into the skin 2 (two) times daily with a meal. Per CBG    [provider]  lanthanum (FOSRENOL) 1000 MG chewable tablet Chew 2,000-4,000 mg by mouth See admin instructions. Chew 4 tablets (4000 mg) by mouth with each meal and 2 tablets (2000 mg) with snacks    [provider]  LORazepam (ATIVAN) 0.5 MG tablet Take a 1/2 tablet 30 minutes before MRI 06/11/20   Esterwood, Amy S, PA-C  multivitamin (RENA-VIT) TABS tablet Take 1 tablet by mouth daily. 12/20/19   [provider]  omeprazole (PRILOSEC) 40 MG capsule Take 1 capsule (40 mg total) by mouth daily. 06/11/20   Esterwood, Amy S, PA-C  ondansetron (ZOFRAN) 4 MG tablet Take 1 tablet (4 mg total) by mouth every 8 (eight) hours as needed for nausea or vomiting. 08/15/20   [name], MD  [facility] VERIO test strip 1 each by Other route 3 (three) times daily.  12/15/17   [provider]  OXYGEN Inhale 2 L into the lungs continuous.    [provider]  prednisoLONE acetate (PRED FORTE) 1 % ophthalmic suspension Place 1 drop into the right eye daily. For eye pain 04/27/17   [provider]    Physical Exam: Vitals:   12/22/20 2104 12/22/20 2306  BP:  (!) 111/46  Pulse:  79  Resp:  18  Temp:  97.9 F (36.6 C)  TempSrc:  Oral  SpO2:  100%  Weight: 72 kg   Height: 5\' 5"  (1.651 m)     Constitutional: NAD, calm,  comfortable Vitals:   12/22/20  2104 12/22/20 2306  BP:  (!) 111/46  Pulse:  79  Resp:  18  Temp:  97.9 F (36.6 C)  TempSrc:  Oral  SpO2:  100%  Weight: 72 kg   Height: 5\' 5"  (1.651 m)    General: WDWN, Alert and oriented x3.  Eyes: closed. conjunctivae normal.  HENT:  [place]/AT, external ears normal. Nares patent without epistasis.  Mucous membranes are moist. Posterior pharynx clear of any exudate or lesions.  Neck: Soft, normal range of motion, supple, no masses,  Trachea midline Respiratory: clear to auscultation bilaterally, no wheezing, no crackles. Normal respiratory effort. No accessory muscle use.  Cardiovascular: Regular rate and rhythm, 3/6 Systolic murmur. No rubs / gallops. Fistula in right arm. Abdomen: Soft, no tenderness, nondistended, no rebound or guarding. No masses palpated. Obese. Bowel sounds normoactive Musculoskeletal:  Right hip tender to palpation anteriorly and laterally. Bilateral AKA. 2 cm abrasion left elbow, no bleeding at this time. FROM of upper extremities. no cyanosis.  Normal muscle tone.  Skin: Warm, dry, intact no rashes, lesions, ulcers. No induration Neurologic: CN 2-12 grossly intact.  Normal speech.  Sensation intact to touch. Strength 5/5 in upper extremities.   Psychiatric: Normal judgment and insight.  Normal mood.    Labs on Admission: I have personally reviewed following labs and imaging studies  CBC: Recent Labs  Lab 12/22/20 2300  WBC 9.8  NEUTROABS 7.3  HGB 9.7*  HCT 30.8*  MCV 99.4  PLT 099    Basic Metabolic Panel: Recent Labs  Lab 12/22/20 2300  NA 137  K 3.9  CL 96*  CO2 27  GLUCOSE 108*  BUN 24*  CREATININE 8.41*  CALCIUM 9.1    GFR: Estimated Creatinine Clearance: 6.4 mL/min (A) (by C-G formula based on SCr of 8.41 mg/dL (H)).  Liver Function Tests: No results for input(s): AST, ALT, ALKPHOS, BILITOT, PROT, ALBUMIN in the last 168 hours.  Urine analysis:    Component Value Date/Time   COLORURINE YELLOW 02/06/2018 1709   APPEARANCEUR  HAZY (A) 02/06/2018 1709   LABSPEC 1.013 02/06/2018 1709   LABSPEC 1.025 02/13/2011 1533   PHURINE 8.0 02/06/2018 1709   GLUCOSEU 50 (A) 02/06/2018 1709   HGBUR NEGATIVE 02/06/2018 1709   BILIRUBINUR NEGATIVE 02/06/2018 1709   BILIRUBINUR Negative 02/13/2011 1533   KETONESUR NEGATIVE 02/06/2018 1709   PROTEINUR 100 (A) 02/06/2018 1709   UROBILINOGEN 0.2 04/28/2011 1045   NITRITE NEGATIVE 02/06/2018 1709   LEUKOCYTESUR NEGATIVE 02/06/2018 1709   LEUKOCYTESUR Negative 02/13/2011 1533    Radiological Exams on Admission: DG Pelvis 1-2 Views  Result Date: 12/22/2020 CLINICAL DATA:  Fall, RIGHT hip pain. EXAM: PELVIS - 1-2 VIEW COMPARISON:  None. FINDINGS: Displaced fracture of the RIGHT femoral neck. Visualized portions of the osseous pelvis appear intact and normally aligned. Sacrum in the at bones partially obscured by overlying oral contrast material. Extensive vascular calcifications throughout the pelvis and upper thigh regions. IMPRESSION: Displaced fracture of the RIGHT femoral neck. Electronically Signed   By: Franki Cabot M.D.   On: 12/22/2020 22:40   DG Elbow 2 Views Left  Result Date: 12/22/2020 CLINICAL DATA:  Fall, RIGHT hip pain EXAM: LEFT ELBOW - 2 VIEW COMPARISON:  None FINDINGS: Osseous structures about the elbow appear intact and normally aligned. No fracture line or displaced fracture fragment. No appreciable joint effusion. Soft tissue swelling/edema posterior to the olecranon. Extensive vascular calcifications about the elbow. IMPRESSION: Soft tissue swelling/edema posterior to the  olecranon. No osseous fracture or dislocation. Electronically Signed   By: Franki Cabot M.D.   On: 12/22/2020 22:41   DG Femur Min 2 Views Right  Result Date: 12/22/2020 CLINICAL DATA:  Fall, RIGHT hip pain. EXAM: RIGHT FEMUR 2 VIEWS COMPARISON:  None. FINDINGS: Displaced fracture within the RIGHT femoral neck, intratrochanteric with involvement of the greater trochanter. Associated angulation  deformity at the fracture site, nearly 90 degrees. Femoral head remains grossly well positioned relative to the acetabulum. Surgical amputation deformity of the mid RIGHT femur. Extensive vascular calcifications of the RIGHT upper thigh. IMPRESSION: Displaced fracture within the intertrochanteric RIGHT femoral neck with prominent angulation deformity at the fracture site. Electronically Signed   By: Franki Cabot M.D.   On: 12/22/2020 22:38    Assessment/Plan Principal Problem:   Closed right hip fracture  Ms. Wiater is admitted to Med/Surg floor with right femoral neck fracture.  Orthopedics consulted by ER physician and they will evaluate patient and plan for definitive surgical intervention. Pain control provided with Dilaudid as needed overnight.  Consult discharge planner to arrange rehab placement vs home health after surgery.   Active Problems:   Type II diabetes mellitus with renal manifestations Check blood sugar every 6 hours while NPO. SSI as needed for glycemic control.  Check HgbA1c.     ESRD on dialysis  Will need nephrology consult in am to arrange for HD while hospitalized.     Chronic combined systolic and diastolic CHF (congestive heart failure)  Stable. No signs or symptoms of CHF exacerbation at this time.     Anemia in ESRD (end-stage renal disease) Stable.    S/P AKA (above knee amputation) bilateral  chronic    DVT prophylaxis: No SCD's secondary to bilateral AKA status. No anticoagulation with anticipation of hip surgery Code Status:   Full Code  Family Communication:  Diagnosis and plan discussed with patient and her son who is at bedside.  They verbalized understanding and agree with plan.  Further recommendations to follow as clinically indicated Disposition Plan:   Patient is from:  Home  Anticipated DC to:  SNF for rehab versus home with home health, to be determined  Anticipated DC date:  Anticipate more than 2 midnight stay in the hospital    Anticipated DC barriers: If a rehab bed is needed finding an accepting rehab bed may be a      barrier to discharge  Consults called:  Orthopedics consulted by ER physician and will see pt Admission status:  Inpatient  Yevonne Aline Sandeep Radell MD [facility]  How to contact the [facility] Attending or Consulting provider [place] or covering provider during after hours 7P -7A, for this patient?   1. Check the care team in [facility] and look for a) attending/consulting [facility] provider listed and b) the [facility] team listed 2. Log into [email or web address] and use [place]'s universal password to access. If you do not have the password, please contact the hospital operator. 3. Locate the [facility] provider you are looking for under [facility] and page to a number that you can be directly reached. 4. If you still have difficulty reaching the provider, please page the [facility] (Director on Call) for the Hospitalists listed on amion for assistance.  12/23/2020, 12:19 AM

## 2020-12-23 NOTE — Progress Notes (Signed)
Pharmacy note: Post Operative Fracture Care Anticoagulation    70 yo female s/p femur fracture on plavic PTA for history of PAD. Pharmacy to restart plavix on POD #1 per protocol   Plan -plavix 75mg  po daily (start in the morning) -Will sign off. Please contact pharmacy with any other needs.  Thank you Hildred Laser, PharmD Clinical Pharmacist **Pharmacist phone directory can now be found on [email or web address] ([email or web address]).  Listed under [place].

## 2020-12-23 NOTE — Anesthesia Postprocedure Evaluation (Signed)
Anesthesia Post Note  Patient: [name]  Procedure(s) Performed: INTRAMEDULLARY (IM) AFFIXUS NAIL INTERTROCHANTRIC (Right )     Patient location during evaluation: PACU Anesthesia Type: General Level of consciousness: awake and alert Pain management: pain level controlled Vital Signs Assessment: post-procedure vital signs reviewed and stable Respiratory status: spontaneous breathing, nonlabored ventilation, respiratory function stable and patient connected to nasal cannula oxygen Cardiovascular status: blood pressure returned to baseline and stable Postop Assessment: no apparent nausea or vomiting Anesthetic complications: no   No complications documented.  Last Vitals:  Vitals:   12/23/20 2000 12/23/20 2100  BP: (!) 105/49 (!) 110/46  Pulse: 85 83  Resp: 10 12  Temp: 36.6 C 36.9 C  SpO2: 100% 100%    Last Pain:  Vitals:   12/23/20 2100  TempSrc: Axillary  PainSc: 0-No pain                 Belenda Cruise P Aimy Sweeting

## 2020-12-23 NOTE — Transfer of Care (Signed)
Immediate Anesthesia Transfer of Care Note  Patient: [name]  Procedure(s) Performed: INTRAMEDULLARY (IM) AFFIXUS NAIL INTERTROCHANTRIC (Right )  Patient Location: PACU  Anesthesia Type:General  Level of Consciousness: awake and responds to stimulation  Airway & Oxygen Therapy: Patient Spontanous Breathing and Patient connected to face mask oxygen  Post-op Assessment: Report given to RN and Post -op Vital signs reviewed and stable  Post vital signs: Reviewed and stable  Last Vitals:  Vitals Value Taken Time  BP 105/38 12/23/20 1934  Temp    Pulse 86 12/23/20 1929  Resp 9 12/23/20 1935  SpO2 83 % 12/23/20 1929  Vitals shown include unvalidated device data.  Last Pain:  Vitals:   12/23/20 1619  TempSrc: Oral  PainSc: 10-Worst pain ever      Patients Stated Pain Goal: 3 (34/28/76 8115)  Complications: No complications documented.

## 2020-12-23 NOTE — Anesthesia Procedure Notes (Signed)
Procedure Name: Intubation Date/Time: 12/23/2020 5:19 PM Performed by: Clearnce Sorrel, CRNA Pre-anesthesia Checklist: Patient identified, Emergency Drugs available, Suction available, Patient being monitored and Timeout performed Patient Re-evaluated:Patient Re-evaluated prior to induction Oxygen Delivery Method: Circle system utilized Preoxygenation: Pre-oxygenation with 100% oxygen Induction Type: IV induction Ventilation: Mask ventilation without difficulty and Oral airway inserted - appropriate to patient size Laryngoscope Size: Mac and 3 Grade View: Grade I Tube type: Oral Tube size: 7.0 mm Number of attempts: 1 Airway Equipment and Method: Stylet Placement Confirmation: ETT inserted through vocal cords under direct vision,  positive ETCO2 and breath sounds checked- equal and bilateral Secured at: 23 cm Tube secured with: Tape Dental Injury: Teeth and Oropharynx as per pre-operative assessment

## 2020-12-23 NOTE — Progress Notes (Signed)
Patient stable. Having right hip pain as expected Denies any other orthopedic complaints Complex intertrochanteric fracture above above-knee amputation on the right Have consulted trauma service for further management Talked with her son whose name is Erlene Quan whose phone number is [phone].  Once I have a disposition either I or the primary team can call him with update.

## 2020-12-23 NOTE — Progress Notes (Signed)
PROGRESS NOTE    MAGDALEN CABANA  TKW:[id number] DOB: 01/19/1951 DOA: 12/22/2020 PCP: Prince Solian, MD   Brief Narrative:  [name] is a 70 y.o. female with medical history significant for ESRD on HD Monday, Wednesday and Fridays, DMT2, HTN, CHF, HLD, blindness due to glaucoma, bilateral AKA status.  She presents after mechanical fall at home, having fallen asleep in her wheelchair without the break on and it rolled out from under her.  She presented to the ED for right hip pain, imaging notable for femur fracture, orthopedics consulted and hospitalist called for admission.  Assessment & Plan:   Principal Problem:   Closed right hip fracture (HCC) Active Problems:   Type II diabetes mellitus with renal manifestations (HCC)   ESRD on dialysis ([place])   Chronic combined systolic and diastolic CHF (congestive heart failure) (HCC)   Anemia in ESRD (end-stage renal disease) (HCC)   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HCC)   Closed right hip fracture, secondary to mechanical fall -Orthopedics following, appreciate insight and recommendations who have requested trauma surgery evaluation and assistance due to somewhat complex fracture and patient with above-knee amputation -Patient remains n.p.o. for possible procedure later today - if she is unable to have procedure today would likely advance her diet to renal/diabetic diet -Pain currently well controlled, continue current regimen  T insulin-dependent ype II diabetes mellitus with renal, vascular, and ophthalmologic manifestations -Continue every 6 Accu-Chek, transition to [facility] S once able to tolerate p.o. 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 with bilateral amputations in the setting of vasculopathy unlikely in the setting of poorly controlled diabetes history  -Currently well controlled A1c 5.2 -somewhat concerning for too tightly controlled glucose, may liberalize patient's diet for decrease  home regimen prior to discharge to ensure no hypoglycemic events given her obvious high risk for falls given above  ESRD on dialysis Monday Wednesday Friday Nephrology following, appreciate insight and recommendations, continue dialysis per their expertise   Chronic systolic and diastolic congestive heart failure without acute exacerbation  -Patient appears stable, euvolemic, fluid management likely through dialysis at this point  Chronic anemia of chronic disease in setting of ESRD  -Continue to follow, defer to nephrology for EPO/similar injections as needed  S/P AKA (above knee amputation) bilateral  Chronic -likely secondary to uncontrolled diabetes and vasculopathy  Bilateral glaucoma and blindness -Chronic again likely in the setting of uncontrolled diabetes history  DVT prophylaxis:    Currently holding perioperatively, defer to orthopedic and trauma surgery for postoperative DVT prophylaxis Code Status: Full Code  Family Communication: None present  Status is: Inpatient  Dispo: The patient is from: Home              Anticipated d/c is to: To be determined, likely home pending clinical course              Anticipated d/c date is: 48 to 72 hours              Patient currently not medically stable for discharge  Consultants:   Orthopedic surgery, trauma surgery, nephrology  Procedures:   Right femur fracture repair per specialists, intervention to be determined  Antimicrobials:  None indicated  Subjective: No acute issues or events overnight denies nausea vomiting diarrhea constipation headache fevers chills chest pain shortness of breath.  Objective: Vitals:   12/22/20 2306 12/23/20 0252 12/23/20 0443 12/23/20 0732  BP: (!) 111/46 (!) 111/24  (!) 113/31  Pulse: 79 79  81  Resp: 18 16  14   Temp: 97.9 F (36.6 C) 98.3 F (36.8 C)  97.7 F (36.5 C)  TempSrc: Oral Oral  Oral  SpO2: 100% 97%  100%  Weight:   75.5 kg   Height:       No intake or output data  in the 24 hours ending 12/23/20 0752 Filed Weights   12/22/20 2104 12/23/20 0443  Weight: 72 kg 75.5 kg    Examination:  General:  Pleasantly resting in bed, No acute distress. HEENT:  Normocephalic atraumatic.  Sclerae nonicteric, noninjected.  Extraocular movements intact bilaterally. Neck:  Without mass or deformity.  Trachea is midline. Lungs:  Clear to auscultate bilaterally without rhonchi, wheeze, or rales. Heart:  Regular rate and rhythm.  Without murmurs, rubs, or gallops. Abdomen:  Soft, nontender, nondistended.  Without guarding or rebound. Extremities: Bilateral AKA, right hip tender to palpation/range of motion Vascular:  Dorsalis pedis and posterior tibial pulses palpable bilaterally. Skin:  Warm and dry, no erythema, no ulcerations.   Data Reviewed: I have personally reviewed following labs and imaging studies  CBC: Recent Labs  Lab 12/22/20 2300  WBC 9.8  NEUTROABS 7.3  HGB 9.7*  HCT 30.8*  MCV 99.4  PLT 016   Basic Metabolic Panel: Recent Labs  Lab 12/22/20 2300  NA 137  K 3.9  CL 96*  CO2 27  GLUCOSE 108*  BUN 24*  CREATININE 8.41*  CALCIUM 9.1   GFR: Estimated Creatinine Clearance: 6.5 mL/min (A) (by C-G formula based on SCr of 8.41 mg/dL (H)). Liver Function Tests: No results for input(s): AST, ALT, ALKPHOS, BILITOT, PROT, ALBUMIN in the last 168 hours. No results for input(s): LIPASE, AMYLASE in the last 168 hours. No results for input(s): AMMONIA in the last 168 hours. Coagulation Profile: No results for input(s): INR, PROTIME in the last 168 hours. Cardiac Enzymes: No results for input(s): CKTOTAL, CKMB, CKMBINDEX, TROPONINI in the last 168 hours. BNP (last 3 results) No results for input(s): PROBNP in the last 8760 hours. HbA1C: No results for input(s): HGBA1C in the last 72 hours. CBG: Recent Labs  Lab 12/23/20 0645  GLUCAP 105*   Lipid Profile: No results for input(s): CHOL, HDL, LDLCALC, TRIG, CHOLHDL, LDLDIRECT in the last  72 hours. Thyroid Function Tests: No results for input(s): TSH, T4TOTAL, FREET4, T3FREE, THYROIDAB in the last 72 hours. Anemia Panel: No results for input(s): VITAMINB12, FOLATE, FERRITIN, TIBC, IRON, RETICCTPCT in the last 72 hours. Sepsis Labs: No results for input(s): PROCALCITON, LATICACIDVEN in the last 168 hours.  Recent Results (from the past 240 hour(s))  Resp Panel by RT-PCR (Flu A&B, Covid) Nasopharyngeal Swab     Status: None   Collection Time: 12/22/20 11:00 PM   Specimen: Nasopharyngeal Swab; Nasopharyngeal(NP) swabs in vial transport medium  Result Value Ref Range Status   SARS Coronavirus 2 by RT PCR NEGATIVE NEGATIVE Final    Comment: (NOTE) SARS-CoV-2 target nucleic acids are NOT DETECTED.  The SARS-CoV-2 RNA is generally detectable in upper respiratory specimens during the acute phase of infection. The lowest concentration of SARS-CoV-2 viral copies this assay can detect is 138 copies/mL. A negative result does not preclude SARS-Cov-2 infection and should not be used as the sole basis for treatment or other patient management decisions. A negative result may occur with  improper specimen collection/handling, submission of specimen other than nasopharyngeal swab, presence of viral mutation(s) within the areas targeted by this assay, and inadequate number of viral copies(<138 copies/mL). A negative result must  be combined with clinical observations, patient history, and epidemiological information. The expected result is Negative.  Fact Sheet for Patients:  EntrepreneurPulse.com.au  Fact Sheet for Healthcare Providers:  IncredibleEmployment.be  This test is no t yet approved or cleared by the [place] FDA and  has been authorized for detection and/or diagnosis of SARS-CoV-2 by FDA under an Emergency Use Authorization (EUA). This EUA will remain  in effect (meaning this test can be used) for the duration of the COVID-19  declaration under Section 564(b)(1) of the Act, 21 U.S.C.section 360bbb-3(b)(1), unless the authorization is terminated  or revoked sooner.       Influenza A by PCR NEGATIVE NEGATIVE Final   Influenza B by PCR NEGATIVE NEGATIVE Final    Comment: (NOTE) The Xpert Xpress SARS-CoV-2/FLU/RSV plus assay is intended as an aid in the diagnosis of influenza from Nasopharyngeal swab specimens and should not be used as a sole basis for treatment. Nasal washings and aspirates are unacceptable for Xpert Xpress SARS-CoV-2/FLU/RSV testing.  Fact Sheet for Patients: EntrepreneurPulse.com.au  Fact Sheet for Healthcare Providers: IncredibleEmployment.be  This test is not yet approved or cleared by the [place] FDA and has been authorized for detection and/or diagnosis of SARS-CoV-2 by FDA under an Emergency Use Authorization (EUA). This EUA will remain in effect (meaning this test can be used) for the duration of the COVID-19 declaration under Section 564(b)(1) of the Act, 21 U.S.C. section 360bbb-3(b)(1), unless the authorization is terminated or revoked.  Performed at [facility], [place] [address], [place],  [address]          Radiology Studies: DG Pelvis 1-2 Views  Result Date: 12/22/2020 CLINICAL DATA:  Fall, RIGHT hip pain. EXAM: PELVIS - 1-2 VIEW COMPARISON:  None. FINDINGS: Displaced fracture of the RIGHT femoral neck. Visualized portions of the osseous pelvis appear intact and normally aligned. Sacrum in the at bones partially obscured by overlying oral contrast material. Extensive vascular calcifications throughout the pelvis and upper thigh regions. IMPRESSION: Displaced fracture of the RIGHT femoral neck. Electronically Signed   By: Franki Cabot M.D.   On: 12/22/2020 22:40   DG Elbow 2 Views Left  Result Date: 12/22/2020 CLINICAL DATA:  Fall, RIGHT hip pain EXAM: LEFT ELBOW - 2 VIEW COMPARISON:  None FINDINGS: Osseous  structures about the elbow appear intact and normally aligned. No fracture line or displaced fracture fragment. No appreciable joint effusion. Soft tissue swelling/edema posterior to the olecranon. Extensive vascular calcifications about the elbow. IMPRESSION: Soft tissue swelling/edema posterior to the olecranon. No osseous fracture or dislocation. Electronically Signed   By: Franki Cabot M.D.   On: 12/22/2020 22:41   DG Femur Min 2 Views Right  Result Date: 12/22/2020 CLINICAL DATA:  Fall, RIGHT hip pain. EXAM: RIGHT FEMUR 2 VIEWS COMPARISON:  None. FINDINGS: Displaced fracture within the RIGHT femoral neck, intratrochanteric with involvement of the greater trochanter. Associated angulation deformity at the fracture site, nearly 90 degrees. Femoral head remains grossly well positioned relative to the acetabulum. Surgical amputation deformity of the mid RIGHT femur. Extensive vascular calcifications of the RIGHT upper thigh. IMPRESSION: Displaced fracture within the intertrochanteric RIGHT femoral neck with prominent angulation deformity at the fracture site. Electronically Signed   By: Franki Cabot M.D.   On: 12/22/2020 22:38   Scheduled Meds: . pantoprazole  40 mg Oral Daily   Continuous Infusions:   LOS: 1 day   Time spent: 34min  Nicholette Dolson C Emmersyn Kratzke, DO [facility]  If 7PM-7AM, please contact  night-coverage [email or web address]  12/23/2020, 7:52 AM

## 2020-12-23 NOTE — Progress Notes (Signed)
Radiographs reviewed Right femoral neck fracture in a patient with multiple medical comorbidities Definitive treatment likely today versus tomorrow

## 2020-12-24 ENCOUNTER — Encounter (HOSPITAL_COMMUNITY): Payer: Self-pay | Admitting: Orthopedic Surgery

## 2020-12-24 LAB — BASIC METABOLIC PANEL
Anion gap: 22 — ABNORMAL HIGH (ref 5–15)
BUN: 34 mg/dL — ABNORMAL HIGH (ref 8–23)
CO2: 22 mmol/L (ref 22–32)
Calcium: 8.8 mg/dL — ABNORMAL LOW (ref 8.9–10.3)
Chloride: 93 mmol/L — ABNORMAL LOW (ref 98–111)
Creatinine, Ser: 10.36 mg/dL — ABNORMAL HIGH (ref 0.44–1.00)
GFR, Estimated: 4 mL/min — ABNORMAL LOW (ref >60)
Glucose, Bld: 239 mg/dL — ABNORMAL HIGH (ref 70–99)
Potassium: 5 mmol/L (ref 3.5–5.1)
Sodium: 137 mmol/L (ref 135–145)

## 2020-12-24 LAB — GLUCOSE, CAPILLARY
Glucose-Capillary: 107 mg/dL — ABNORMAL HIGH (ref 70–99)
Glucose-Capillary: 142 mg/dL — ABNORMAL HIGH (ref 70–99)
Glucose-Capillary: 150 mg/dL — ABNORMAL HIGH (ref 70–99)
Glucose-Capillary: 169 mg/dL — ABNORMAL HIGH (ref 70–99)
Glucose-Capillary: 183 mg/dL — ABNORMAL HIGH (ref 70–99)
Glucose-Capillary: 266 mg/dL — ABNORMAL HIGH (ref 70–99)

## 2020-12-24 LAB — CBC
HCT: 27.8 % — ABNORMAL LOW (ref 36.0–46.0)
Hemoglobin: 8.8 g/dL — ABNORMAL LOW (ref 12.0–15.0)
MCH: 31.5 pg (ref 26.0–34.0)
MCHC: 31.7 g/dL (ref 30.0–36.0)
MCV: 99.6 fL (ref 80.0–100.0)
Platelets: 204 10*3/uL (ref 150–400)
RBC: 2.79 MIL/uL — ABNORMAL LOW (ref 3.87–5.11)
RDW: 20.3 % — ABNORMAL HIGH (ref 11.5–15.5)
WBC: 5.2 10*3/uL (ref 4.0–10.5)
nRBC: 0 % (ref 0.0–0.2)

## 2020-12-24 LAB — PHOSPHORUS: Phosphorus: 5.9 mg/dL — ABNORMAL HIGH (ref 2.5–4.6)

## 2020-12-24 MED ORDER — LANTHANUM CARBONATE 500 MG PO CHEW
1000.0000 mg | CHEWABLE_TABLET | Freq: Three times a day (TID) | ORAL | Status: DC
  Administered 2020-12-24 – 2020-12-27 (×5): 1000 mg via ORAL
  Filled 2020-12-24 (×12): qty 2

## 2020-12-24 MED ORDER — DOXERCALCIFEROL 4 MCG/2ML IV SOLN
INTRAVENOUS | Status: AC
  Administered 2020-12-24: 3 ug via INTRAVENOUS
  Filled 2020-12-24: qty 2

## 2020-12-24 MED ORDER — DIPHENHYDRAMINE HCL 25 MG PO CAPS
25.0000 mg | ORAL_CAPSULE | Freq: Once | ORAL | Status: AC
  Administered 2020-12-24: 25 mg via ORAL
  Filled 2020-12-24: qty 1

## 2020-12-24 NOTE — Plan of Care (Signed)
  Problem: Education: Goal: Verbalization of understanding the information provided (i.e., activity precautions, restrictions, etc) will improve Outcome: Progressing Goal: Individualized Educational Video(s) Outcome: Progressing   Problem: Activity: Goal: Ability to ambulate and perform ADLs will improve Outcome: Progressing   

## 2020-12-24 NOTE — Progress Notes (Signed)
Inpatient Diabetes Program Recommendations  AACE/ADA: New Consensus Statement on Inpatient Glycemic Control   Target Ranges:  Prepandial:   less than 140 mg/dL      Peak postprandial:   less than 180 mg/dL (1-2 hours)      Critically ill patients:  140 - 180 mg/dL   Results for [name], [name] (MRN [id number]) as of 12/24/2020 10:55  Ref. Range 12/23/2020 06:45 12/23/2020 11:33 12/23/2020 15:54 12/23/2020 19:29 12/24/2020 00:19 12/24/2020 06:56  Glucose-Capillary Latest Ref Range: 70 - 99 mg/dL 105 (H) 103 (H) 100 (H) 113 (H) 266 (H) 183 (H)   Review of Glycemic Control  Diabetes history: DM2 Outpatient Diabetes medications: Novolog 8 units QAM, Novolog 6 units QPM Current orders for Inpatient glycemic control: Novolog 0-6 units Q6H PRN  Inpatient Diabetes Program Recommendations:    Insulin: Please discontinue Novolog 0-6 units Q6H PRN and order CBGs AC&HS with Novolog 0-6 units TID with meals and Novolog 0-5 units QHS.  Thanks, Barnie Alderman, RN, MSN, CDE Diabetes Coordinator Inpatient Diabetes Program [phone] (Team Pager from 8am to 5pm)

## 2020-12-24 NOTE — Progress Notes (Signed)
Orthopaedic Trauma Service Progress Note  Patient ID: [name] MRN: [id number] DOB/AGE: [birth year] 68 y.o.  Subjective:  No acute issues   ROS As above Objective:   VITALS:   Vitals:   12/24/20 0930 12/24/20 1000 12/24/20 1030 12/24/20 1100  BP: (!) 123/44 (!) 102/46 (!) 125/46 (!) 129/45  Pulse: 86 86 90 87  Resp:      Temp:      TempSrc:      SpO2:      Weight:      Height:        Estimated body mass index is 27.51 kg/m as calculated from the following:   Height as of this encounter: 5\' 5"  (1.651 m).   Weight as of this encounter: 75 kg.   Intake/Output      06/05 0701 06/06 0700 06/06 0701 06/07 0700   I.V. (mL/kg) 600 (7.9)    IV Piggyback 250    Total Intake(mL/kg) 850 (11.3)    Blood 50    Total Output 50    Net +800           LABS  Results for orders placed or performed during the hospital encounter of 12/22/20 (from the past 24 hour(s))  Glucose, capillary     Status: Abnormal   Collection Time: 12/23/20 11:33 AM  Result Value Ref Range   Glucose-Capillary 103 (H) 70 - 99 mg/dL  Surgical pcr screen     Status: Abnormal   Collection Time: 12/23/20  3:05 PM   Specimen: Nasal Mucosa; Nasal Swab  Result Value Ref Range   MRSA, PCR POSITIVE (A) NEGATIVE   Staphylococcus aureus POSITIVE (A) NEGATIVE  Basic metabolic panel     Status: Abnormal   Collection Time: 12/23/20  3:08 PM  Result Value Ref Range   Sodium 139 135 - 145 mmol/L   Potassium 4.9 3.5 - 5.1 mmol/L   Chloride 98 98 - 111 mmol/L   CO2 25 22 - 32 mmol/L   Glucose, Bld 83 70 - 99 mg/dL   BUN 28 (H) 8 - 23 mg/dL   Creatinine, Ser 9.46 (H) 0.44 - 1.00 mg/dL   Calcium 9.0 8.9 - 10.3 mg/dL   GFR, Estimated 4 (L) >60 mL/min   Anion gap 16 (H) 5 - 15  Glucose, capillary     Status: Abnormal   Collection Time: 12/23/20  3:54 PM  Result Value Ref Range   Glucose-Capillary 100 (H) 70 - 99 mg/dL  Glucose,  capillary     Status: Abnormal   Collection Time: 12/23/20  7:29 PM  Result Value Ref Range   Glucose-Capillary 113 (H) 70 - 99 mg/dL  Glucose, capillary     Status: Abnormal   Collection Time: 12/24/20 12:19 AM  Result Value Ref Range   Glucose-Capillary 266 (H) 70 - 99 mg/dL  CBC     Status: Abnormal   Collection Time: 12/24/20  3:29 AM  Result Value Ref Range   WBC 5.2 4.0 - 10.5 K/uL   RBC 2.79 (L) 3.87 - 5.11 MIL/uL   Hemoglobin 8.8 (L) 12.0 - 15.0 g/dL   HCT 27.8 (L) 36.0 - 46.0 %   MCV 99.6 80.0 - 100.0 fL   MCH 31.5 26.0 - 34.0 pg   MCHC 31.7 30.0 - 36.0 g/dL  RDW 20.3 (H) 11.5 - 15.5 %   Platelets 204 150 - 400 K/uL   nRBC 0.0 0.0 - 0.2 %  Basic metabolic panel     Status: Abnormal   Collection Time: 12/24/20  3:29 AM  Result Value Ref Range   Sodium 137 135 - 145 mmol/L   Potassium 5.0 3.5 - 5.1 mmol/L   Chloride 93 (L) 98 - 111 mmol/L   CO2 22 22 - 32 mmol/L   Glucose, Bld 239 (H) 70 - 99 mg/dL   BUN 34 (H) 8 - 23 mg/dL   Creatinine, Ser 10.36 (H) 0.44 - 1.00 mg/dL   Calcium 8.8 (L) 8.9 - 10.3 mg/dL   GFR, Estimated 4 (L) >60 mL/min   Anion gap 22 (H) 5 - 15  Glucose, capillary     Status: Abnormal   Collection Time: 12/24/20  6:56 AM  Result Value Ref Range   Glucose-Capillary 183 (H) 70 - 99 mg/dL     PHYSICAL EXAM:   Gen: NAD, appears well Ext:       Right Lower Extremity   Dressings stable  Stump in good condition    Assessment/Plan: 1 Day Post-Op   Principal Problem:   Closed right hip fracture (HCC) Active Problems:   Type II diabetes mellitus with renal manifestations (HCC)   ESRD on dialysis (HCC)   Chronic combined systolic and diastolic CHF (congestive heart failure) (HCC)   Anemia in ESRD (end-stage renal disease) (HCC)   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HCC)   Anti-infectives (From admission, onward)   Start     Dose/Rate Route Frequency Ordered Stop   12/23/20 2200  ceFAZolin (ANCEF) IVPB 2g/100 mL premix  Status:   Discontinued        2 g 200 mL/hr over 30 Minutes Intravenous Every 6 hours 12/23/20 2103 12/23/20 2135   12/23/20 1658  ceFAZolin (ANCEF) 2-4 GM/100ML-% IVPB       Note to Pharmacy: Nyoka Cowden   : cabinet override      12/23/20 1658 12/24/20 0459    .  POD/HD#: 1  70 y/o female with R hip fracture, B AKAs  -fall  - R hip fracture s/p IMN   ROM as tolerated   No restrictions with R leg  Dressing changes as needed starting 12/25/2020  - Dispo:  Ortho issues stable  Continue per medicine service     Jari Pigg, PA-C [phone] (C) 12/24/2020, 11:04 AM  [facility] [place] [id number] [phone] Jenetta [phone] (F)    After 5pm and on the weekends please log on to Amion, go to orthopaedics and the look under the [facility] Call for the provider(s) on call. You can also call our office at [phone] and then follow the prompts to be connected to the call team.

## 2020-12-24 NOTE — Progress Notes (Signed)
PROGRESS NOTE    [name]  BHA:[id number] DOB: 1950/09/26 DOA: 12/22/2020 PCP: Prince Solian, MD   Brief Narrative:  [name] is a 70 y.o. female with medical history significant for ESRD on HD Monday, Wednesday and Fridays, DMT2, HTN, CHF, HLD, blindness due to glaucoma, bilateral AKA status.  She presents after mechanical fall at home, having fallen asleep in her wheelchair without the break on and it rolled out from under her.  She presented to the ED for right hip pain, imaging notable for femur fracture, orthopedics consulted and hospitalist called for admission.  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 with bilateral amputations in the setting of vasculopathy unlikely in the setting of poorly controlled diabetes history  -Currently well controlled A1c 5.2 -somewhat concerning for too tightly controlled glucose, may liberalize patient's diet for decrease home regimen prior to discharge to ensure no hypoglycemic events given her obvious high risk for falls given above  ESRD on dialysis Monday Wednesday Friday Nephrology following, appreciate insight and recommendations, continue dialysis per their expertise   Chronic systolic  and diastolic congestive heart failure without acute exacerbation  -Patient appears stable, euvolemic, fluid management likely through dialysis at this point  Chronic anemia of chronic disease in setting of ESRD  -Continue to follow, defer to nephrology for EPO/similar injections as needed  S/P AKA (above knee amputation) bilaterally Chronic -likely secondary to uncontrolled diabetes and vasculopathy  Bilateral glaucoma and blindness -Chronic again likely in the setting of uncontrolled diabetes history  DVT prophylaxis:    Currently holding perioperatively, defer to orthopedic and trauma surgery for postoperative DVT prophylaxis Code Status: Full Code  Family Communication: None present  Status is: Inpatient  Dispo: The patient is from: Home              Anticipated d/c is to: To be determined, likely home pending clinical course              Anticipated d/c date is: 48 to 72 hours              Patient currently not medically stable for discharge  Consultants:   Orthopedic surgery, trauma surgery, nephrology  Procedures:   Right femur fracture repair 12/23/2020  Antimicrobials:  None indicated  Subjective: No acute issues or events overnight, patient tolerated procedure quite well, currently in dialysis with no acute complaints, appears to be tolerating quite well.  Otherwise pain well controlled denies nausea vomiting diarrhea constipation headache fevers or chills.  Objective: Vitals:   12/23/20 2000 12/23/20 2100 12/24/20 0000 12/24/20 0600  BP: (!) 105/49 (!) 110/46 (!) 130/51 (!) 118/47  Pulse: 85 83 82 85  Resp: 10 12 14 15   Temp: 97.9 F (36.6 C) 98.5 F (36.9 C) 99.1 F (37.3 C) 98.6 F (37 C)  TempSrc:  Axillary  Oral Oral  SpO2: 100% 100% 95% 94%  Weight:      Height:        Intake/Output Summary (Last 24 hours) at 12/24/2020 0737 Last data filed at 12/23/2020 1934 Gross per 24 hour  Intake 850 ml  Output 50 ml  Net 800 ml   Filed Weights   12/22/20  2104 12/23/20 0443 12/23/20 1619  Weight: 72 kg 75.5 kg 75.5 kg    Examination:  General:  Pleasantly resting in bed, No acute distress. HEENT:  Normocephalic atraumatic.  Sclerae nonicteric, noninjected.  Extraocular movements intact bilaterally. Neck:  Without mass or deformity.  Trachea is midline. Lungs:  Clear to auscultate bilaterally without rhonchi, wheeze, or rales. Heart:  Regular rate and rhythm.  Without murmurs, rubs, or gallops. Abdomen:  Soft, nontender, nondistended.  Without guarding or rebound. Extremities: Bilateral AKA, right hip tender to palpation/range of motion Vascular:  Dorsalis pedis and posterior tibial pulses palpable bilaterally. Skin:  Warm and dry, no erythema, no ulcerations.   Data Reviewed: I have personally reviewed following labs and imaging studies  CBC: Recent Labs  Lab 12/22/20 2300 12/24/20 0329  WBC 9.8 5.2  NEUTROABS 7.3  --   HGB 9.7* 8.8*  HCT 30.8* 27.8*  MCV 99.4 99.6  PLT 235 712   Basic Metabolic Panel: Recent Labs  Lab 12/22/20 2300 12/23/20 1508 12/24/20 0329  NA 137 139 137  K 3.9 4.9 5.0  CL 96* 98 93*  CO2 27 25 22   GLUCOSE 108* 83 239*  BUN 24* 28* 34*  CREATININE 8.41* 9.46* 10.36*  CALCIUM 9.1 9.0 8.8*   GFR: Estimated Creatinine Clearance: 5.3 mL/min (A) (by C-G formula based on SCr of 10.36 mg/dL (H)). Liver Function Tests: No results for input(s): AST, ALT, ALKPHOS, BILITOT, PROT, ALBUMIN in the last 168 hours. No results for input(s): LIPASE, AMYLASE in the last 168 hours. No results for input(s): AMMONIA in the last 168 hours. Coagulation Profile: No results for input(s): INR, PROTIME in the last 168 hours. Cardiac Enzymes: No results for input(s): CKTOTAL, CKMB, CKMBINDEX, TROPONINI in the last 168 hours. BNP (last 3 results) No results for input(s): PROBNP in the last 8760 hours. HbA1C: No results for input(s): HGBA1C in the last 72 hours. CBG: Recent Labs  Lab 12/23/20 1133 12/23/20 1554  12/23/20 1929 12/24/20 0019 12/24/20 0656  GLUCAP 103* 100* 113* 266* 183*   Lipid Profile: No results for input(s): CHOL, HDL, LDLCALC, TRIG, CHOLHDL, LDLDIRECT in the last 72 hours. Thyroid Function Tests: No results for input(s): TSH, T4TOTAL, FREET4, T3FREE, THYROIDAB in the last 72 hours. Anemia Panel: No results for input(s): VITAMINB12, FOLATE, FERRITIN, TIBC, IRON, RETICCTPCT in the last 72 hours. Sepsis Labs: No results for input(s): PROCALCITON, LATICACIDVEN in the last 168 hours.  Recent Results (from the past 240 hour(s))  Resp Panel by RT-PCR (Flu A&B, Covid) Nasopharyngeal Swab     Status: None   Collection Time: 12/22/20 11:00 PM   Specimen: Nasopharyngeal Swab; Nasopharyngeal(NP) swabs in vial transport medium  Result Value Ref Range Status   SARS Coronavirus 2 by RT PCR NEGATIVE NEGATIVE Final    Comment: (NOTE) SARS-CoV-2 target nucleic acids are NOT DETECTED.  The SARS-CoV-2 RNA is generally detectable in upper respiratory specimens during the acute phase of infection. The lowest concentration of SARS-CoV-2 viral copies this assay can detect is 138 copies/mL. A negative result does not preclude SARS-Cov-2 infection and should not be used as the sole basis for treatment or other  patient management decisions. A negative result may occur with  improper specimen collection/handling, submission of specimen other than nasopharyngeal swab, presence of viral mutation(s) within the areas targeted by this assay, and inadequate number of viral copies(<138 copies/mL). A negative result must be combined with clinical observations, patient history, and epidemiological information. The expected result is Negative.  Fact Sheet for Patients:  EntrepreneurPulse.com.au  Fact Sheet for Healthcare Providers:  IncredibleEmployment.be  This test is no t yet approved or cleared by the [place] FDA and  has been authorized for detection  and/or diagnosis of SARS-CoV-2 by FDA under an Emergency Use Authorization (EUA). This EUA will remain  in effect (meaning this test can be used) for the duration of the COVID-19 declaration under Section 564(b)(1) of the Act, 21 U.S.C.section 360bbb-3(b)(1), unless the authorization is terminated  or revoked sooner.       Influenza A by PCR NEGATIVE NEGATIVE Final   Influenza B by PCR NEGATIVE NEGATIVE Final    Comment: (NOTE) The Xpert Xpress SARS-CoV-2/FLU/RSV plus assay is intended as an aid in the diagnosis of influenza from Nasopharyngeal swab specimens and should not be used as a sole basis for treatment. Nasal washings and aspirates are unacceptable for Xpert Xpress SARS-CoV-2/FLU/RSV testing.  Fact Sheet for Patients: EntrepreneurPulse.com.au  Fact Sheet for Healthcare Providers: IncredibleEmployment.be  This test is not yet approved or cleared by the [place] FDA and has been authorized for detection and/or diagnosis of SARS-CoV-2 by FDA under an Emergency Use Authorization (EUA). This EUA will remain in effect (meaning this test can be used) for the duration of the COVID-19 declaration under Section 564(b)(1) of the Act, 21 U.S.C. section 360bbb-3(b)(1), unless the authorization is terminated or revoked.  Performed at [facility], [place] [address] [place], [place] [address]   Surgical pcr screen     Status: Abnormal   Collection Time: 12/23/20  3:05 PM   Specimen: Nasal Mucosa; Nasal Swab  Result Value Ref Range Status   MRSA, PCR POSITIVE (A) NEGATIVE Final    Comment: RESULT CALLED TO, READ BACK BY AND VERIFIED WITH: RN Chari Manning ON [id number] AT [id number] BY E.PARRISH    Staphylococcus aureus POSITIVE (A) NEGATIVE Final    Comment: (NOTE) The Xpert SA Assay (FDA approved for NASAL specimens in patients 35 years of age and older), is one component of a comprehensive surveillance program. It is not intended to diagnose  infection nor to guide or monitor treatment. Performed at [facility], [place] [address] [place], [place] [address]          Radiology Studies: DG Pelvis 1-2 Views  Result Date: 12/22/2020 CLINICAL DATA:  Fall, RIGHT hip pain. EXAM: PELVIS - 1-2 VIEW COMPARISON:  None. FINDINGS: Displaced fracture of the RIGHT femoral neck. Visualized portions of the osseous pelvis appear intact and normally aligned. Sacrum in the at bones partially obscured by overlying oral contrast material. Extensive vascular calcifications throughout the pelvis and upper thigh regions. IMPRESSION: Displaced fracture of the RIGHT femoral neck. Electronically Signed   By: Franki Cabot M.D.   On: 12/22/2020 22:40   DG Elbow 2 Views Left  Result Date: 12/22/2020 CLINICAL DATA:  Fall, RIGHT hip pain EXAM: LEFT ELBOW - 2 VIEW COMPARISON:  None FINDINGS: Osseous structures about the elbow appear intact and normally aligned. No fracture line or displaced fracture fragment. No appreciable joint effusion. Soft tissue swelling/edema posterior to the olecranon. Extensive vascular calcifications about the elbow. IMPRESSION: Soft tissue swelling/edema  posterior to the olecranon. No osseous fracture or dislocation. Electronically Signed   By: Franki Cabot M.D.   On: 12/22/2020 22:41   DG C-Arm 1-60 Min  Result Date: 12/23/2020 CLINICAL DATA:  Intraoperative images of right femoral nail placement. EXAM: DG C-ARM 1-60 MIN CONTRAST:  None FLUOROSCOPY TIME:  Fluoroscopy Time:  54 seconds Radiation Exposure Index (if provided by the fluoroscopic device): Not reported Number of Acquired Spot Images: 8 COMPARISON:  None. FINDINGS: Intraoperative fluoroscopy was obtained during placement of a intramedullary nail within the right femur. IMPRESSION: Intraoperative fluoroscopy during placement of antegrade intramedullary nail with right femur. Electronically Signed   By: Ulyses Jarred M.D.   On: 12/23/2020 20:52   DG Hip Port Unilat With  Pelvis 1V Right  Result Date: 12/23/2020 CLINICAL DATA:  Fracture EXAM: DG HIP (WITH OR WITHOUT PELVIS) 1V PORT RIGHT COMPARISON:  None. FINDINGS: Antegrade intramedullary nail with interlocking screw of the proximal right femur. Small amount of soft tissue gas. Atherosclerotic calcification. IMPRESSION: Internal fixation of proximal right femoral fracture without adverse features. Electronically Signed   By: Ulyses Jarred M.D.   On: 12/23/2020 20:49   DG FEMUR, MIN 2 VIEWS RIGHT  Result Date: 12/23/2020 CLINICAL DATA:  Intramedullary nail of the right femur EXAM: RIGHT FEMUR 2 VIEWS COMPARISON:  None. FINDINGS: Eight fluoroscopic images were obtained during placement of a short intramedullary nail in the right femur. IMPRESSION: Successful placement of short intramedullary nail in the right femur. Electronically Signed   By: Ulyses Jarred M.D.   On: 12/23/2020 20:50   DG Femur Min 2 Views Right  Result Date: 12/22/2020 CLINICAL DATA:  Fall, RIGHT hip pain. EXAM: RIGHT FEMUR 2 VIEWS COMPARISON:  None. FINDINGS: Displaced fracture within the RIGHT femoral neck, intratrochanteric with involvement of the greater trochanter. Associated angulation deformity at the fracture site, nearly 90 degrees. Femoral head remains grossly well positioned relative to the acetabulum. Surgical amputation deformity of the mid RIGHT femur. Extensive vascular calcifications of the RIGHT upper thigh. IMPRESSION: Displaced fracture within the intertrochanteric RIGHT femoral neck with prominent angulation deformity at the fracture site. Electronically Signed   By: Franki Cabot M.D.   On: 12/22/2020 22:38   Scheduled Meds: . acetaminophen  1,000 mg Oral Q8H  . Chlorhexidine Gluconate Cloth  6 each Topical Q0600  . clopidogrel  75 mg Oral Daily  . docusate sodium  100 mg Oral BID  . doxercalciferol  3 mcg Intravenous Q M,W,F-HD  . pantoprazole  40 mg Oral Daily   Continuous Infusions:   LOS: 2 days   Time spent:  29min  Shahara Hartsfield C Pualani Borah, DO [facility]  If 7PM-7AM, please contact night-coverage [email or web address]  12/24/2020, 7:37 AM

## 2020-12-24 NOTE — Progress Notes (Addendum)
[facility] Progress Note   Subjective: Seen on HD, tolerating well. UFG 2.0L. No C/Os.   Objective Vitals:   12/24/20 0600 12/24/20 0800 12/24/20 0807 12/24/20 0830  BP: (!) 118/47 (!) 117/46 (!) 113/51 (!) 120/49  Pulse: 85 81 86 86  Resp: 15 16    Temp: 98.6 F (37 C) (!) 96.7 F (35.9 C)    TempSrc: Oral Oral    SpO2: 94% 100%    Weight:  75 kg    Height:       Physical Exam General: Chronically ill appearing female in NAD Heart: Z6,X0 2/6 systolic M  Lungs: CTAB Abdomen: obese active BS Extremities: Bilateral AKA no stump edema Dialysis Access: R AVF Cannulated   Additional Objective Labs: Basic Metabolic Panel: Recent Labs  Lab 12/22/20 2300 12/23/20 1508 12/24/20 0329  NA 137 139 137  K 3.9 4.9 5.0  CL 96* 98 93*  CO2 27 25 22   GLUCOSE 108* 83 239*  BUN 24* 28* 34*  CREATININE 8.41* 9.46* 10.36*  CALCIUM 9.1 9.0 8.8*   Liver Function Tests: No results for input(s): AST, ALT, ALKPHOS, BILITOT, PROT, ALBUMIN in the last 168 hours. No results for input(s): LIPASE, AMYLASE in the last 168 hours. CBC: Recent Labs  Lab 12/22/20 2300 12/24/20 0329  WBC 9.8 5.2  NEUTROABS 7.3  --   HGB 9.7* 8.8*  HCT 30.8* 27.8*  MCV 99.4 99.6  PLT 235 204   Blood Culture    Component Value Date/Time   SDES BLOOD LEFT HAND 10/19/2019 1959   SPECREQUEST  10/19/2019 1959    BOTTLES DRAWN AEROBIC ONLY Blood Culture adequate volume   CULT  10/19/2019 1959    NO GROWTH 5 DAYS Performed at [facility], [place] [address], [place], [place] [address]    REPTSTATUS 10/24/2019 FINAL 10/19/2019 1959    Cardiac Enzymes: No results for input(s): CKTOTAL, CKMB, CKMBINDEX, TROPONINI in the last 168 hours. CBG: Recent Labs  Lab 12/23/20 1133 12/23/20 1554 12/23/20 1929 12/24/20 0019 12/24/20 0656  GLUCAP 103* 100* 113* 266* 183*   Iron Studies: No results for input(s): IRON, TIBC, TRANSFERRIN, FERRITIN in the last 72  hours. @lablastinr3 @ Studies/Results: DG Pelvis 1-2 Views  Result Date: 12/22/2020 CLINICAL DATA:  Fall, RIGHT hip pain. EXAM: PELVIS - 1-2 VIEW COMPARISON:  None. FINDINGS: Displaced fracture of the RIGHT femoral neck. Visualized portions of the osseous pelvis appear intact and normally aligned. Sacrum in the at bones partially obscured by overlying oral contrast material. Extensive vascular calcifications throughout the pelvis and upper thigh regions. IMPRESSION: Displaced fracture of the RIGHT femoral neck. Electronically Signed   By: Franki Cabot M.D.   On: 12/22/2020 22:40   DG Elbow 2 Views Left  Result Date: 12/22/2020 CLINICAL DATA:  Fall, RIGHT hip pain EXAM: LEFT ELBOW - 2 VIEW COMPARISON:  None FINDINGS: Osseous structures about the elbow appear intact and normally aligned. No fracture line or displaced fracture fragment. No appreciable joint effusion. Soft tissue swelling/edema posterior to the olecranon. Extensive vascular calcifications about the elbow. IMPRESSION: Soft tissue swelling/edema posterior to the olecranon. No osseous fracture or dislocation. Electronically Signed   By: Franki Cabot M.D.   On: 12/22/2020 22:41   DG C-Arm 1-60 Min  Result Date: 12/23/2020 CLINICAL DATA:  Intraoperative images of right femoral nail placement. EXAM: DG C-ARM 1-60 MIN CONTRAST:  None FLUOROSCOPY TIME:  Fluoroscopy Time:  54 seconds Radiation Exposure Index (if provided by the fluoroscopic device): Not reported  Number of Acquired Spot Images: 8 COMPARISON:  None. FINDINGS: Intraoperative fluoroscopy was obtained during placement of a intramedullary nail within the right femur. IMPRESSION: Intraoperative fluoroscopy during placement of antegrade intramedullary nail with right femur. Electronically Signed   By: Ulyses Jarred M.D.   On: 12/23/2020 20:52   DG Hip Port Unilat With Pelvis 1V Right  Result Date: 12/23/2020 CLINICAL DATA:  Fracture EXAM: DG HIP (WITH OR WITHOUT PELVIS) 1V PORT RIGHT  COMPARISON:  None. FINDINGS: Antegrade intramedullary nail with interlocking screw of the proximal right femur. Small amount of soft tissue gas. Atherosclerotic calcification. IMPRESSION: Internal fixation of proximal right femoral fracture without adverse features. Electronically Signed   By: Ulyses Jarred M.D.   On: 12/23/2020 20:49   DG FEMUR, MIN 2 VIEWS RIGHT  Result Date: 12/23/2020 CLINICAL DATA:  Intramedullary nail of the right femur EXAM: RIGHT FEMUR 2 VIEWS COMPARISON:  None. FINDINGS: Eight fluoroscopic images were obtained during placement of a short intramedullary nail in the right femur. IMPRESSION: Successful placement of short intramedullary nail in the right femur. Electronically Signed   By: Ulyses Jarred M.D.   On: 12/23/2020 20:50   DG Femur Min 2 Views Right  Result Date: 12/22/2020 CLINICAL DATA:  Fall, RIGHT hip pain. EXAM: RIGHT FEMUR 2 VIEWS COMPARISON:  None. FINDINGS: Displaced fracture within the RIGHT femoral neck, intratrochanteric with involvement of the greater trochanter. Associated angulation deformity at the fracture site, nearly 90 degrees. Femoral head remains grossly well positioned relative to the acetabulum. Surgical amputation deformity of the mid RIGHT femur. Extensive vascular calcifications of the RIGHT upper thigh. IMPRESSION: Displaced fracture within the intertrochanteric RIGHT femoral neck with prominent angulation deformity at the fracture site. Electronically Signed   By: Franki Cabot M.D.   On: 12/22/2020 22:38   Medications:  . acetaminophen  1,000 mg Oral Q8H  . Chlorhexidine Gluconate Cloth  6 each Topical Q0600  . clopidogrel  75 mg Oral Daily  . docusate sodium  100 mg Oral BID  . doxercalciferol  3 mcg Intravenous Q M,W,F-HD  . pantoprazole  40 mg Oral Daily     Dialysis Orders:  [facility] MWF 4h  400/500 72.5kg 2K/2Ca bath UFP 2 AVF  -No heparin  -Hectorol 3 mcg IVTIW -Venofer 50 mg IV q wk -Mircera 225 q 2 weeks (last on 5/30)    Assessment/Plan: 1. R hip fx - management per ortho service  2. ESRD -  HD MWF. Missed Friday. HD today  on schedule. K+ 5.0 using 2.0 K bath.  3. Hypertension/volume  - BP controlled. No volume excess on exam. UF to dry weight as tolerated 4. Anemia  - Hgb 8.8. Recent ESA dose as outpatient. Follow trends  5. Metabolic bone disease -  Ca ok. Continue Fosrenol binder, Hectorol  6. DMT2 -insulin per primary team  7. Nutrition - Renal diet w fluid restrictions    Rita H. Brown NP-C 12/24/2020, 9:13 AM  [facility] [phone]   Nephrology attending: Patient was seen and examined at dialysis unit.  Chart reviewed.  I agree with assessment and plan as outlined above.  70 year old female ESRD on HD MWF, hypertension, anemia, admitted with right femur fracture seen by orthopedics.  Further intervention and pain management per Ortho/primary team.  Receiving dialysis today per schedule.  Hemoglobin is below goal however received Mircera on 5/30.  Continue to monitor.  Katheran James, MD [facility].

## 2020-12-24 NOTE — Evaluation (Signed)
Physical Therapy Evaluation Patient Details Name: [name] MRN: [id number] DOB: 1951-07-07 Today's Date: 12/24/2020   History of Present Illness  [name] is a 70 y.o. female who was admitted with a right hip fracture following a fall at home. She fell out of her wheelchair. pt s/p IM nail 6/5. PMH: ESRD on HD MWF, DMT2, HTN, CHF, PAD s/p bilateral AKA, blindness.  Clinical Impression  Pt admitted with above. Pt was w/c and/or bed bound PTA. Pt reports sister is at home at all times however notes from previous admission states only intermittent care available. Due to patient being blind, having bilat AKA, and now a R IM nail pt unsafe to be home alone. If 24/7 assist can not be provide pt will need SNF as she is unsafe to transfer in/out of w/c without assist. Acute pt to cont to follow.     Follow Up Recommendations Supervision/Assistance - 24 hour;Home health PT (unless family is not available 24/7, pending information provided by family as pt was a poor historian)    Equipment Recommendations  None recommended by PT    Recommendations for Other Services       Precautions / Restrictions Precautions Precautions: Fall Precaution Comments: bilat AKA; bilat blindness Restrictions Weight Bearing Restrictions: No      Mobility  Bed Mobility Overal bed mobility: Needs Assistance Bed Mobility: Supine to Sit;Sit to Supine     Supine to sit: Total assist;+2 for physical assistance Sit to supine: Total assist;+2 for physical assistance   General bed mobility comments: pt total assist for trunk elevation and pvt hips to EOB, pt with increase R Hip during transfer but settled once EOB    Transfers                    Ambulation/Gait                Stairs            Wheelchair Mobility    Modified Rankin (Stroke Patients Only)       Balance Overall balance assessment: Needs assistance Sitting-balance support: Feet unsupported;No upper  extremity supported Sitting balance-Leahy Scale: Fair Sitting balance - Comments: pt initially dependent to maintain EOB balance but then transition to close min guard and was able to wash face without LOB, pt toelrated EOB sitting x 6 min                                     Pertinent Vitals/Pain Pain Assessment: Faces Faces Pain Scale: Hurts even more Pain Location: R hip with transfer to EOB, no pain at rest Pain Descriptors / Indicators: Grimacing;Guarding;Moaning Pain Intervention(s): Monitored during session    Home Living Family/patient expects to be discharged to:: Private residence Living Arrangements: Other relatives (sister) Available Help at Discharge: Family;Available PRN/intermittently Type of Home: House Home Access: Ramped entrance     Home Layout: One level Home Equipment: Wheelchair - manual;Shower seat;Bedside commode Additional Comments: pt poor historian, information taken from previous admission    Prior Function Level of Independence: Needs assistance   Gait / Transfers Assistance Needed: pt reports assist for lateral scoot transfer into w/c  ADL's / Homemaking Assistance Needed: family assist with dressing/bathing, can feed self        Hand Dominance   Dominant Hand: Right    Extremity/Trunk Assessment   Upper Extremity Assessment Upper Extremity Assessment: Generalized  weakness    Lower Extremity Assessment Lower Extremity Assessment:  (bilat AKA, limited R hip ROM due to IM nail and onset of pain)       Communication   Communication: No difficulties  Cognition Arousal/Alertness: Awake/alert Behavior During Therapy: Flat affect Overall Cognitive Status: Impaired/Different from baseline Area of Impairment: Orientation;Attention;Memory;Following commands                 Orientation Level: Disoriented to;Place;Time Current Attention Level: Focused Memory: Decreased recall of precautions;Decreased short-term  memory Following Commands: Follows one step commands inconsistently;Follows one step commands with increased time       General Comments: pt poor historian and recall of events/being in hospital. Pt kept callign out for sister      General Comments General comments (skin integrity, edema, etc.): R hip incision covered by dressing, SpO2 >91% on RA    Exercises     Assessment/Plan    PT Assessment Patient needs continued PT services  PT Problem List Decreased strength;Decreased range of motion;Decreased activity tolerance;Decreased balance;Decreased mobility;Decreased knowledge of use of DME;Pain       PT Treatment Interventions DME instruction;Gait training;Stair training;Therapeutic exercise;Balance training;Therapeutic activities;Functional mobility training    PT Goals (Current goals can be found in the Care Plan section)  Acute Rehab PT Goals PT Goal Formulation: With patient Time For Goal Achievement: 01/07/21 Potential to Achieve Goals: Fair    Frequency Min 3X/week   Barriers to discharge        Co-evaluation PT/OT/SLP Co-Evaluation/Treatment: Yes Reason for Co-Treatment: Complexity of the patient's impairments (multi-system involvement) PT goals addressed during session: Mobility/safety with mobility         AM-PAC PT "6 Clicks" Mobility  Outcome Measure Help needed turning from your back to your side while in a flat bed without using bedrails?: Total Help needed moving from lying on your back to sitting on the side of a flat bed without using bedrails?: Total Help needed moving to and from a bed to a chair (including a wheelchair)?: Total Help needed standing up from a chair using your arms (e.g., wheelchair or bedside chair)?: Total Help needed to walk in hospital room?: Total Help needed climbing 3-5 steps with a railing? : Total 6 Click Score: 6    End of Session   Activity Tolerance: Patient tolerated treatment well Patient left: in bed;with call  bell/phone within reach;with bed alarm set Nurse Communication: Mobility status PT Visit Diagnosis: Unsteadiness on feet (R26.81);Muscle weakness (generalized) (M62.81);Difficulty in walking, not elsewhere classified (R26.2)    Time: 9357-0177 PT Time Calculation (min) (ACUTE ONLY): 24 min   Charges:   PT Evaluation $PT Eval Moderate Complexity: 1 Mod          Kittie Plater, PT, DPT Acute Rehabilitation Services Pager #: [phone] Office #: [phone]   Berline Lopes 12/24/2020, 2:41 PM

## 2020-12-24 NOTE — Progress Notes (Addendum)
Occupational Therapy Evaluation Patient Details Name: [name] MRN: [id number] DOB: 12/31/1950 Today's Date: 12/24/2020    History of Present Illness ELYSA WOMAC is a 70 y.o. female who was admitted with a right hip fracture following a fall at home. She fell out of her wheelchair. pt s/p IM nail 6/5. PMH: ESRD on HD MWF, DMT2, HTN, CHF, PAD s/p bilateral AKA, blindness.   Clinical Impression   [name] was evaluated s/p the above R hip fx and IM nail placement. PTA pt reports she lives with her sister who is home all of the time, she completes surface transfers indep, bathes and dresses with set up. However, pt seems to not be an accurate historian at this time; PLOF and home info should be confirmed with sister/family. Pt requires mod/max A +2 for bed mobility at this time. Max +2 for lower ADLs and min A for upper ADLs. Due to pt being blind, bilat AKA and now IM nail placement, she would benefit from continued OT acutely and recommend SNF level post acute care to progress function in ADLs and mobility.     Follow Up Recommendations  SNF;Supervision/Assistance - 24 hour;Other (comment) (d/c recommendation pending family confirmation of home environment, PLOF and available support. If family can provide physical assistance needed and 24/7 support, pt may be safe to d/c home)          Precautions / Restrictions Precautions Precautions: Fall Precaution Comments: bilat AKA; bilat blindness Restrictions Weight Bearing Restrictions: Yes RLE Weight Bearing: Weight bearing as tolerated      Mobility Bed Mobility Overal bed mobility: Needs Assistance Bed Mobility: Supine to Sit;Sit to Supine     Supine to sit: Mod assist;+2 for physical assistance;+2 for safety/equipment Sit to supine: Max assist;+2 for physical assistance;+2 for safety/equipment   General bed mobility comments: pt total assist for trunk elevation and pvt hips to EOB, pt with increase R Hip during transfer but  settled once EOB    Transfers Overall transfer level: Needs assistance               General transfer comment: deferred this session for py safety    Balance Overall balance assessment: Needs assistance Sitting-balance support: Feet unsupported;No upper extremity supported Sitting balance-Leahy Scale: Fair Sitting balance - Comments: pt initially dependent to maintain EOB balance but then transition to close min guard and was able to wash face without LOB, pt toelrated EOB sitting x 6 min         ADL either performed or assessed with clinical judgement   ADL Overall ADL's : Needs assistance/impaired Eating/Feeding: Set up;Supervision/ safety;Bed level   Grooming: Wash/dry hands;Wash/dry face;Oral care;Applying deodorant;Set up;Supervision/safety;Sitting   Upper Body Bathing: Moderate assistance;Bed level   Lower Body Bathing: Moderate assistance;Bed level   Upper Body Dressing : Moderate assistance;Sitting   Lower Body Dressing: Maximal assistance;Bed level   Toilet Transfer: Maximal assistance;+2 for safety/equipment;Transfer board   Toileting- Clothing Manipulation and Hygiene: Maximal assistance;Bed level       Functional mobility during ADLs: Maximal assistance;Cueing for safety;Cueing for sequencing;Wheelchair General ADL Comments: ADLs completed at bed level for safety     Vision Baseline Vision/History: Legally blind Patient Visual Report: No change from baseline              Pertinent Vitals/Pain Pain Assessment: Faces Faces Pain Scale: Hurts even more Pain Location: R hip with movement Pain Descriptors / Indicators: Grimacing;Guarding;Moaning Pain Intervention(s): Limited activity within patient's tolerance;Monitored during session  Hand Dominance Right   Extremity/Trunk Assessment Upper Extremity Assessment Upper Extremity Assessment: Overall WFL for tasks assessed   Lower Extremity Assessment Lower Extremity Assessment: Defer to PT  evaluation   Cervical / Trunk Assessment Cervical / Trunk Assessment: Normal   Communication Communication Communication: No difficulties (Simultaneous filing. User may not have seen previous data.)   Cognition Arousal/Alertness: Awake/alert Behavior During Therapy: WFL for tasks assessed/performed Overall Cognitive Status: No family/caregiver present to determine baseline cognitive functioning Area of Impairment: Orientation;Attention;Memory;Following commands                 Orientation Level: Disoriented to;Place;Time Current Attention Level: Focused Memory: Decreased recall of precautions;Decreased short-term memory Following Commands: Follows one step commands inconsistently;Follows one step commands with increased time       General Comments: Pt disorietned to time, unable to answer simple questions accurately (Simultaneous filing. User may not have seen previous data.)   General Comments  No new concerns, pt continuously asking for sister throughout, disoriented      Home Living Family/patient expects to be discharged to:: Private residence  Living Arrangements: Other relatives  Available Help at Discharge: Family;Available PRN/intermittently Type of Home: House Home Access: Ramped entrance     Home Layout: One level     Bathroom Shower/Tub: Teacher, early years/pre: Standard Bathroom Accessibility: No   Home Equipment: Wheelchair - manual;Shower seat;Bedside commode   Additional Comments: pt not an accurate historian, home living and PLOF need to be confirmed with family member (sister?)      Prior Functioning/Environment Level of Independence: Needs assistance Gait / Transfers Assistance Needed: pt reports assist for lateral scoot transfer into w/c ADL's / Homemaking Assistance Needed: family assist with dressing/bathing, can feed self   Comments: PLOF needs to be confirmed with family member        OT Problem List: Decreased  strength;Decreased range of motion;Decreased activity tolerance;Impaired balance (sitting and/or standing);Decreased knowledge of use of DME or AE;Decreased knowledge of precautions;Decreased safety awareness;Pain      OT Treatment/Interventions: Self-care/ADL training;Therapeutic exercise;Neuromuscular education    OT Goals(Current goals can be found in the care plan section) Acute Rehab OT Goals Patient Stated Goal: to see her sister OT Goal Formulation: With patient Time For Goal Achievement: 01/07/21 Potential to Achieve Goals: Fair ADL Goals Pt Will Perform Upper Body Bathing: with set-up;sitting Pt Will Perform Lower Body Bathing: with set-up;bed level Pt Will Perform Toileting - Clothing Manipulation and hygiene: with min guard assist;sitting/lateral leans Additional ADL Goal #1: Pt will complete functional surface transfers with min A to relieve caregiver burden for ADLs  OT Frequency: Min 2X/week        Co-evaluation PT/OT/SLP Co-Evaluation/Treatment: Yes Reason for Co-Treatment: Complexity of the patient's impairments (multi-system involvement);For patient/therapist safety PT goals addressed during session: Mobility/safety with mobility OT goals addressed during session: ADL's and self-care (functional transfers)      AM-PAC OT "6 Clicks" Daily Activity     Outcome Measure Help from another person eating meals?: A Little Help from another person taking care of personal grooming?: A Little Help from another person toileting, which includes using toliet, bedpan, or urinal?: A Lot Help from another person bathing (including washing, rinsing, drying)?: A Lot Help from another person to put on and taking off regular upper body clothing?: A Lot Help from another person to put on and taking off regular lower body clothing?: A Lot 6 Click Score: 14   End of Session Nurse Communication: Mobility status;Precautions;Weight bearing  status  Activity Tolerance: Patient tolerated  treatment well Patient left: in bed;with bed alarm set;with call bell/phone within reach  OT Visit Diagnosis: Unsteadiness on feet (R26.81);Other abnormalities of gait and mobility (R26.89);Repeated falls (R29.6);Muscle weakness (generalized) (M62.81);Pain Pain - Right/Left: Right Pain - part of body: Hip                Time: 4270-6237 OT Time Calculation (min): 24 min Charges:  OT General Charges $OT Visit: 1 Visit OT Evaluation $OT Eval Moderate Complexity: 1 Mod    Lenny Fiumara A Tsion Inghram 12/24/2020, 5:05 PM

## 2020-12-24 NOTE — Progress Notes (Signed)
PT Cancellation Note  Patient Details Name: [name] MRN: [id number] DOB: 14-May-1951   Cancelled Treatment:    Reason Eval/Treat Not Completed: Patient at procedure or test/unavailable. Pt in HD. PT to return as able to complete PT eval.  Kittie Plater, PT, DPT Acute Rehabilitation Services Pager #: [phone] Office #: [phone]    Berline Lopes 12/24/2020, 9:21 AM

## 2020-12-25 ENCOUNTER — Other Ambulatory Visit: Payer: Self-pay | Admitting: *Deleted

## 2020-12-25 DIAGNOSIS — Z992 Dependence on renal dialysis: Secondary | ICD-10-CM

## 2020-12-25 DIAGNOSIS — N186 End stage renal disease: Secondary | ICD-10-CM

## 2020-12-25 DIAGNOSIS — D631 Anemia in chronic kidney disease: Secondary | ICD-10-CM

## 2020-12-25 DIAGNOSIS — Z89612 Acquired absence of left leg above knee: Secondary | ICD-10-CM

## 2020-12-25 DIAGNOSIS — Z794 Long term (current) use of insulin: Secondary | ICD-10-CM

## 2020-12-25 DIAGNOSIS — E1122 Type 2 diabetes mellitus with diabetic chronic kidney disease: Secondary | ICD-10-CM

## 2020-12-25 DIAGNOSIS — Z89611 Acquired absence of right leg above knee: Secondary | ICD-10-CM

## 2020-12-25 DIAGNOSIS — I5042 Chronic combined systolic (congestive) and diastolic (congestive) heart failure: Secondary | ICD-10-CM

## 2020-12-25 LAB — BASIC METABOLIC PANEL
Anion gap: 12 (ref 5–15)
BUN: 21 mg/dL (ref 8–23)
CO2: 31 mmol/L (ref 22–32)
Calcium: 8.2 mg/dL — ABNORMAL LOW (ref 8.9–10.3)
Chloride: 93 mmol/L — ABNORMAL LOW (ref 98–111)
Creatinine, Ser: 6.17 mg/dL — ABNORMAL HIGH (ref 0.44–1.00)
GFR, Estimated: 7 mL/min — ABNORMAL LOW (ref >60)
Glucose, Bld: 151 mg/dL — ABNORMAL HIGH (ref 70–99)
Potassium: 4 mmol/L (ref 3.5–5.1)
Sodium: 136 mmol/L (ref 135–145)

## 2020-12-25 LAB — CBC
HCT: 28.1 % — ABNORMAL LOW (ref 36.0–46.0)
Hemoglobin: 9 g/dL — ABNORMAL LOW (ref 12.0–15.0)
MCH: 31.4 pg (ref 26.0–34.0)
MCHC: 32 g/dL (ref 30.0–36.0)
MCV: 97.9 fL (ref 80.0–100.0)
Platelets: 202 10*3/uL (ref 150–400)
RBC: 2.87 MIL/uL — ABNORMAL LOW (ref 3.87–5.11)
RDW: 20.5 % — ABNORMAL HIGH (ref 11.5–15.5)
WBC: 6.5 10*3/uL (ref 4.0–10.5)
nRBC: 0 % (ref 0.0–0.2)

## 2020-12-25 LAB — GLUCOSE, CAPILLARY
Glucose-Capillary: 137 mg/dL — ABNORMAL HIGH (ref 70–99)
Glucose-Capillary: 157 mg/dL — ABNORMAL HIGH (ref 70–99)
Glucose-Capillary: 148 mg/dL — ABNORMAL HIGH (ref 70–99)
Glucose-Capillary: 108 mg/dL — ABNORMAL HIGH (ref 70–99)
Glucose-Capillary: 127 mg/dL — ABNORMAL HIGH (ref 70–99)

## 2020-12-25 MED ORDER — INSULIN ASPART 100 UNIT/ML IJ SOLN: 0.0000 [IU] | Freq: Every day | INTRAMUSCULAR | Status: DC

## 2020-12-25 MED ORDER — INSULIN ASPART 100 UNIT/ML IJ SOLN: 0.0000 [IU] | Freq: Three times a day (TID) | INTRAMUSCULAR | Status: DC

## 2020-12-25 NOTE — Patient Outreach (Signed)
[facility]) Care Management  12/25/2020  BEADIE MATSUNAGA 10-09-1950 [id number]  Patient was admitted to the hospital on 12/22/20. Nurse Health Coach will perform case closure and transfer patient to the [facility] Team.  [facility] Liaison Coordinator informed nurse of patient's admission.    Plan: RN Health Coach Discipline Closure Discipline Closure letter sent to PCP    Emelia Loron RN, [place] [phone] [email or web address]

## 2020-12-25 NOTE — Progress Notes (Signed)
PROGRESS NOTE    CAASI GIGLIA  GYI:[id number] DOB: 1951-01-21 DOA: 12/22/2020 PCP: Prince Solian, MD   Brief Narrative:  [name] is a 70 y.o. female with medical history significant for ESRD on HD Monday, Wednesday and Fridays, DMT2, HTN, CHF, HLD, blindness due to glaucoma, bilateral AKA status.  She presents after mechanical fall at home, having fallen asleep in her wheelchair without the break on and it rolled out from under her.  She presented to the ED for right hip pain, imaging notable for femur fracture, orthopedics consulted and hospitalist called for admission.  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, vascular, and ophthalmologic manifestations -ACHS/sliding scale ongoing -Patient ESRD, 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 with bilateral amputations in the setting of vasculopathy unlikely in the setting of poorly controlled diabetes history  -Repeat A1C pending - last 5.2 in 11/21  ESRD on dialysis Monday Wednesday Friday - Nephrology following, appreciate insight and recommendations, continue dialysis per their expertise   Chronic systolic and diastolic congestive heart failure without acute exacerbation  -Patient appears stable, euvolemic, fluid management likely through dialysis at this point  Chronic anemia of chronic disease in setting of ESRD  -Continue to follow, defer to nephrology for EPO/similar  injections as needed  S/P AKA (above knee amputation) bilaterally Chronic -likely secondary to uncontrolled diabetes and vasculopathy  Bilateral glaucoma and blindness -Chronic again likely in the setting of uncontrolled diabetes history  DVT prophylaxis:    Currently holding perioperatively, defer to orthopedic and trauma surgery for postoperative DVT prophylaxis Code Status: Full Code  Family Communication: At bedside  Status is: Inpatient  Dispo: The patient is from: Home              Anticipated d/c is to: Likely SNF per discussion with family today as patient cannot transfer out of bed to wheelchair which is her previous baseline              Anticipated d/c date is: 48 to 72 hours              Patient currently not medically stable for discharge  Consultants:   Orthopedic surgery, trauma surgery, nephrology  Procedures:   Right femur fracture repair 12/23/2020  Antimicrobials:  None indicated  Subjective: No acute issues or events overnight, patient appears to be close to baseline per family somewhat somnolent due to pain medication but generally well, family voices concern for patient's weakness and inability to help transfer to wheelchair which is her baseline, if she cannot help transfer to wheelchair will likely need SNF or potentially permanent placement if she is unable to rehab back to her previous baseline given limited help available at home.  Objective: Vitals:   12/24/20 1259 12/24/20 2115 12/24/20 2348 12/25/20 0350  BP: (!) 113/45 (!) 116/46 (!) 113/45 105/61  Pulse: 88 86 79 89  Resp: 17 18 17 18   Temp: 98 F (36.7 C) 98.7 F (37.1 C) 98.4 F (36.9 C) 98.2  F (36.8 C)  TempSrc: Oral Oral Oral Oral  SpO2:  100% 100% 100%  Weight:      Height:        Intake/Output Summary (Last 24 hours) at 12/25/2020 0750 Last data filed at 12/24/2020 1207 Gross per 24 hour  Intake --  Output 2000 ml  Net -2000 ml   Filed Weights   12/23/20 1619 12/24/20 0800  12/24/20 1215  Weight: 75.5 kg 75 kg 73.2 kg    Examination:  General:  Pleasantly resting in bed, No acute distress.  Somewhat somnolent but answering questions appropriately at her baseline. HEENT:  Normocephalic atraumatic.  Sclerae nonicteric, noninjected.  Extraocular movements intact bilaterally. Neck:  Without mass or deformity.  Trachea is midline. Lungs:  Clear to auscultate bilaterally without rhonchi, wheeze, or rales. Heart:  Regular rate and rhythm.  Without murmurs, rubs, or gallops. Abdomen:  Soft, nontender, nondistended.  Without guarding or rebound. Extremities: Bilateral AKA, right hip tender to palpation/range of motion - bandage clean/dry/intact Vascular:  Dorsalis pedis and posterior tibial pulses palpable bilaterally. Skin:  Warm and dry, no erythema, no ulcerations-right leg bandage clean dry intact.   Data Reviewed: I have personally reviewed following labs and imaging studies  CBC: Recent Labs  Lab 12/22/20 2300 12/24/20 0329 12/25/20 0302  WBC 9.8 5.2 6.5  NEUTROABS 7.3  --   --   HGB 9.7* 8.8* 9.0*  HCT 30.8* 27.8* 28.1*  MCV 99.4 99.6 97.9  PLT 235 204 366   Basic Metabolic Panel: Recent Labs  Lab 12/22/20 2300 12/23/20 1508 12/24/20 0329 12/25/20 0302  NA 137 139 137 136  K 3.9 4.9 5.0 4.0  CL 96* 98 93* 93*  CO2 27 25 22 31   GLUCOSE 108* 83 239* 151*  BUN 24* 28* 34* 21  CREATININE 8.41* 9.46* 10.36* 6.17*  CALCIUM 9.1 9.0 8.8* 8.2*  PHOS  --   --  5.9*  --    GFR: Estimated Creatinine Clearance: 8.7 mL/min (A) (by C-G formula based on SCr of 6.17 mg/dL (H)). Liver Function Tests: No results for input(s): AST, ALT, ALKPHOS, BILITOT, PROT, ALBUMIN in the last 168 hours. No results for input(s): LIPASE, AMYLASE in the last 168 hours. No results for input(s): AMMONIA in the last 168 hours. Coagulation Profile: No results for input(s): INR, PROTIME in the last 168 hours. Cardiac Enzymes: No results for input(s): CKTOTAL, CKMB,  CKMBINDEX, TROPONINI in the last 168 hours. BNP (last 3 results) No results for input(s): PROBNP in the last 8760 hours. HbA1C: No results for input(s): HGBA1C in the last 72 hours. CBG: Recent Labs  Lab 12/24/20 1247 12/24/20 1613 12/24/20 1935 12/24/20 2354 12/25/20 0346  GLUCAP 107* 169* 150* 142* 137*   Lipid Profile: No results for input(s): CHOL, HDL, LDLCALC, TRIG, CHOLHDL, LDLDIRECT in the last 72 hours. Thyroid Function Tests: No results for input(s): TSH, T4TOTAL, FREET4, T3FREE, THYROIDAB in the last 72 hours. Anemia Panel: No results for input(s): VITAMINB12, FOLATE, FERRITIN, TIBC, IRON, RETICCTPCT in the last 72 hours. Sepsis Labs: No results for input(s): PROCALCITON, LATICACIDVEN in the last 168 hours.  Recent Results (from the past 240 hour(s))  Resp Panel by RT-PCR (Flu A&B, Covid) Nasopharyngeal Swab     Status: None   Collection Time: 12/22/20 11:00 PM   Specimen: Nasopharyngeal Swab; Nasopharyngeal(NP) swabs in vial transport medium  Result Value Ref Range Status   SARS Coronavirus 2 by RT PCR NEGATIVE NEGATIVE Final    Comment: (NOTE) SARS-CoV-2 target nucleic  acids are NOT DETECTED.  The SARS-CoV-2 RNA is generally detectable in upper respiratory specimens during the acute phase of infection. The lowest concentration of SARS-CoV-2 viral copies this assay can detect is 138 copies/mL. A negative result does not preclude SARS-Cov-2 infection and should not be used as the sole basis for treatment or other patient management decisions. A negative result may occur with  improper specimen collection/handling, submission of specimen other than nasopharyngeal swab, presence of viral mutation(s) within the areas targeted by this assay, and inadequate number of viral copies(<138 copies/mL). A negative result must be combined with clinical observations, patient history, and epidemiological information. The expected result is Negative.  Fact Sheet for Patients:   EntrepreneurPulse.com.au  Fact Sheet for Healthcare Providers:  IncredibleEmployment.be  This test is no t yet approved or cleared by the [place] FDA and  has been authorized for detection and/or diagnosis of SARS-CoV-2 by FDA under an Emergency Use Authorization (EUA). This EUA will remain  in effect (meaning this test can be used) for the duration of the COVID-19 declaration under Section 564(b)(1) of the Act, 21 U.S.C.section 360bbb-3(b)(1), unless the authorization is terminated  or revoked sooner.       Influenza A by PCR NEGATIVE NEGATIVE Final   Influenza B by PCR NEGATIVE NEGATIVE Final    Comment: (NOTE) The Xpert Xpress SARS-CoV-2/FLU/RSV plus assay is intended as an aid in the diagnosis of influenza from Nasopharyngeal swab specimens and should not be used as a sole basis for treatment. Nasal washings and aspirates are unacceptable for Xpert Xpress SARS-CoV-2/FLU/RSV testing.  Fact Sheet for Patients: EntrepreneurPulse.com.au  Fact Sheet for Healthcare Providers: IncredibleEmployment.be  This test is not yet approved or cleared by the [place] FDA and has been authorized for detection and/or diagnosis of SARS-CoV-2 by FDA under an Emergency Use Authorization (EUA). This EUA will remain in effect (meaning this test can be used) for the duration of the COVID-19 declaration under Section 564(b)(1) of the Act, 21 U.S.C. section 360bbb-3(b)(1), unless the authorization is terminated or revoked.  Performed at [facility], [place] [address], [place], [place] [address]   Surgical pcr screen     Status: Abnormal   Collection Time: 12/23/20  3:05 PM   Specimen: Nasal Mucosa; Nasal Swab  Result Value Ref Range Status   MRSA, PCR POSITIVE (A) NEGATIVE Final    Comment: RESULT CALLED TO, READ BACK BY AND VERIFIED WITH: RN Chari Manning ON [id number] AT [id number] BY E.PARRISH    Staphylococcus  aureus POSITIVE (A) NEGATIVE Final    Comment: (NOTE) The Xpert SA Assay (FDA approved for NASAL specimens in patients 81 years of age and older), is one component of a comprehensive surveillance program. It is not intended to diagnose infection nor to guide or monitor treatment. Performed at [facility], [place] [address] [place], [place] [address]          Radiology Studies: DG C-Arm 1-60 Min  Result Date: 12/23/2020 CLINICAL DATA:  Intraoperative images of right femoral nail placement. EXAM: DG C-ARM 1-60 MIN CONTRAST:  None FLUOROSCOPY TIME:  Fluoroscopy Time:  54 seconds Radiation Exposure Index (if provided by the fluoroscopic device): Not reported Number of Acquired Spot Images: 8 COMPARISON:  None. FINDINGS: Intraoperative fluoroscopy was obtained during placement of a intramedullary nail within the right femur. IMPRESSION: Intraoperative fluoroscopy during placement of antegrade intramedullary nail with right femur. Electronically Signed   By: Ulyses Jarred M.D.   On: 12/23/2020 20:52  DG Hip Port Unilat With Pelvis 1V Right  Result Date: 12/23/2020 CLINICAL DATA:  Fracture EXAM: DG HIP (WITH OR WITHOUT PELVIS) 1V PORT RIGHT COMPARISON:  None. FINDINGS: Antegrade intramedullary nail with interlocking screw of the proximal right femur. Small amount of soft tissue gas. Atherosclerotic calcification. IMPRESSION: Internal fixation of proximal right femoral fracture without adverse features. Electronically Signed   By: Ulyses Jarred M.D.   On: 12/23/2020 20:49   DG FEMUR, MIN 2 VIEWS RIGHT  Result Date: 12/23/2020 CLINICAL DATA:  Intramedullary nail of the right femur EXAM: RIGHT FEMUR 2 VIEWS COMPARISON:  None. FINDINGS: Eight fluoroscopic images were obtained during placement of a short intramedullary nail in the right femur. IMPRESSION: Successful placement of short intramedullary nail in the right femur. Electronically Signed   By: Ulyses Jarred M.D.   On: 12/23/2020 20:50    Scheduled Meds: . acetaminophen  1,000 mg Oral Q8H  . Chlorhexidine Gluconate Cloth  6 each Topical Q0600  . clopidogrel  75 mg Oral Daily  . docusate sodium  100 mg Oral BID  . doxercalciferol  3 mcg Intravenous Q M,W,F-HD  . lanthanum  1,000 mg Oral TID WC  . pantoprazole  40 mg Oral Daily   Continuous Infusions:   LOS: 3 days   Time spent: 52min  Byanca Kasper C Ahana Najera, DO [facility]  If 7PM-7AM, please contact night-coverage [email or web address]  12/25/2020, 7:50 AM

## 2020-12-25 NOTE — TOC Initial Note (Signed)
Transition of Care [facility]) - Initial/Assessment Note    Patient Details  Name: [name] MRN: [id number] Date of Birth: Dec 08, 1950  [facility] CM/SW Contact:    Milinda Antis, [place] Phone Number: 12/25/2020, 3:54 PM  Clinical Narrative:                 CSW received consult for possible SNF placement at time of discharge.   CSW spoke with patient's sister, [name], at bedside. [name] stated that she currently stays with the patient and the patient is never alone.  The patient also has a son and daughter who assist with care as well.  The family is open to the patient going home with home health services as long as the patient is able to be transported to and from the wheel chair safely.  The family already has a lift at the home to assist with transfers.  CSW informed that [facility] has been providing home health services.  [name] would like to observe how the patient performs with PT/OT and will make a decision about whether the family can manage at home or if SNF placement is needed.   Expected Discharge Plan: [place] Barriers to Discharge: Continued Medical Work up   Patient Goals and CMS Choice   CMS Medicare.gov Compare Post Acute Care list provided to:: Patient Represenative (must comment) Choice offered to / list presented to :  (patient's sister regina)  Expected Discharge Plan and Services Expected Discharge Plan: [place]       Living arrangements for the past 2 months: Single Family Home                                      Prior Living Arrangements/Services Living arrangements for the past 2 months: Single Family Home Lives with:: Siblings              Current home services: Home PT,Home RN,Home OT    Activities of Daily Living Home Assistive Devices/Equipment: CBG Meter,Oxygen,Wheelchair ADL Screening (condition at time of admission) Patient's cognitive ability adequate to safely complete  daily activities?: Yes Is the patient deaf or have difficulty hearing?: No Does the patient have difficulty seeing, even when wearing glasses/contacts?: Yes Does the patient have difficulty concentrating, remembering, or making decisions?: No Patient able to express need for assistance with ADLs?: Yes Does the patient have difficulty dressing or bathing?: Yes Independently performs ADLs?: No Communication: Independent Dressing (OT): Needs assistance Is this a change from baseline?: Pre-admission baseline Grooming: Needs assistance Is this a change from baseline?: Pre-admission baseline Feeding: Independent Bathing: Needs assistance Is this a change from baseline?: Pre-admission baseline Toileting: Needs assistance Is this a change from baseline?: Pre-admission baseline In/Out Bed: Independent with device (comment) Walks in Home: Independent with device (comment) Does the patient have difficulty walking or climbing stairs?: Yes Weakness of Legs: Both Weakness of Arms/Hands: Both  Permission Sought/Granted                  Emotional Assessment              Admission diagnosis:  Closed right hip fracture ([place]) [S72.001A] Closed displaced intertrochanteric fracture of right femur, initial encounter ([place]) [S72.141A] Patient Active Problem List   Diagnosis Date Noted  . Closed right hip fracture ([place]) 12/22/2020  . Benign neoplasm of colon   . GI bleed  05/23/2020  . Upper GI bleed 05/22/2020  . Angiodysplasia of intestine with hemorrhage 01/24/2020  . Acute blood loss anemia 12/16/2019  . Hypotension 12/16/2019  . 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[place]) 11/17/2019  . Infection of amputation stump, left lower extremity ([place]) 10/19/2019  . Peripheral artery disease ([place]) 09/30/2019  . Gangrene of foot ([place]) 08/30/2019  . Other fatigue 02/28/2019  . Finger pain, left 02/03/2019  . Trigger ring finger of left hand 02/03/2019  . Osteomyelitis, unspecified ([place])  11/16/2018  . Diabetic retinopathy associated with type 2 diabetes mellitus ([place]) 11/15/2018  . Rheumatoid factor positive 11/15/2018  . Anemia in ESRD (end-stage renal disease) ([place]) 05/06/2018  . Asthma 05/06/2018  . Hx of adenomatous colonic polyps 04/07/2018  . Status post carpal tunnel release 12/10/2017  . Carpal tunnel syndrome, left upper limb 11/10/2017  . Carpal tunnel syndrome, right upper limb 11/10/2017  . Bilateral hand numbness 09/28/2017  . Dependence on renal dialysis ([place]) 09/11/2017  . Symptomatic anemia 07/06/2017  . Hypertension 07/06/2017  . Chronic combined systolic and diastolic CHF (congestive heart failure) ([place]) 07/06/2017  . Complicated migraine 18/84/1660  . ESRD on dialysis ([place]) 06/04/2017  . Blindness of both eyes 06/04/2017  . Glaucoma 06/04/2017  . Essential hypertension 10/27/2016  . GERD (gastroesophageal reflux disease) 10/27/2016  . Hypercalcemia 05/28/2015  . Abnormal stress test 03/15/2015  . Poor venous access 02/08/2015  . Diarrhea, unspecified 11/02/2014  . Fever, unspecified 11/02/2014  . Pain, unspecified 11/02/2014  . Pruritus, unspecified 11/02/2014  . Shortness of breath 07/28/2014  . Acquired absence of eye 03/31/2014  . Coagulation defect, unspecified ([place]) 03/22/2014  . Dysuria 02/14/2014  . Encounter for immunization 01/03/2014  . Iron deficiency anemia, unspecified 10/18/2013  . Unspecified protein-calorie malnutrition ([place]) 09/23/2013  . Complication of vascular dialysis catheter 06/21/2013  . Fibromyalgia 06/21/2013  . Personal history of breast cancer 06/21/2013  . Personal history of other diseases of the musculoskeletal system and connective tissue 06/21/2013  . Type 2 diabetes mellitus with diabetic polyneuropathy ([place]) 06/21/2013  . Secondary hyperparathyroidism of renal origin ([place]) 06/20/2013  . Unspecified complication of cardiac and vascular prosthetic device, implant and graft, subsequent encounter 06/20/2013  .  Type II diabetes mellitus with renal manifestations ([place]) 06/15/2013  . Neuropathy 12/31/2011  . Breast cancer of upper-inner quadrant of left female breast ([place]) 06/27/2011   PCP:  Prince Solian, MD Pharmacy:   [facility] [place], [place] - [place] AT [address] [place] [address] Phone: [phone] Fax: [phone]  [facility] - [place], [place] - [address] [employer], [place] [address] Phone: [phone] Fax: [phone]     Social Determinants of Health (SDOH) Interventions    Readmission Risk Interventions Readmission Risk Prevention Plan 11/18/2019 10/24/2019 10/21/2019  Transportation Screening Complete - Complete  PCP or Specialist Appt within 3-5 Days - - Complete  HRI or [place] - - Complete  Social Work Consult for [place] Planning/Counseling - - Complete  Palliative Care Screening - - Complete  Medication Review Press photographer) Complete - Complete  PCP or Specialist appointment within 3-5 days of discharge Complete Complete -  [place] or Home Care Consult Complete Complete -  SW Recovery Care/Counseling Consult Complete Complete -  Palliative Care Screening Complete Not Applicable -  [place] Complete Patient Refused -  Some recent data might be hidden

## 2020-12-25 NOTE — Progress Notes (Signed)
Orthopaedic Trauma Service Progress Note  Patient ID: [name] MRN: [id number] DOB/AGE: [birth year] 70 y.o.  Subjective:  No complaints  Pain better than pre-op    ROS As above  Objective:   VITALS:   Vitals:   12/24/20 2115 12/24/20 2348 12/25/20 0350 12/25/20 0804  BP: (!) 116/46 (!) 113/45 105/61 (!) 105/35  Pulse: 86 79 89 85  Resp: 18 17 18 17   Temp: 98.7 F (37.1 C) 98.4 F (36.9 C) 98.2 F (36.8 C) 98.4 F (36.9 C)  TempSrc: Oral Oral Oral Oral  SpO2: 100% 100% 100% 90%  Weight:      Height:        Estimated body mass index is 26.85 kg/m as calculated from the following:   Height as of this encounter: 5\' 5"  (1.651 m).   Weight as of this encounter: 73.2 kg.   Intake/Output      06/06 0701 06/07 0700 06/07 0701 06/08 0700   I.V. (mL/kg)     IV Piggyback     Total Intake(mL/kg)     Other 2000    Blood     Total Output 2000    Net -2000         Stool Occurrence 2 x      LABS  Results for orders placed or performed during the hospital encounter of 12/22/20 (from the past 24 hour(s))  Glucose, capillary     Status: Abnormal   Collection Time: 12/24/20  4:13 PM  Result Value Ref Range   Glucose-Capillary 169 (H) 70 - 99 mg/dL  Glucose, capillary     Status: Abnormal   Collection Time: 12/24/20  7:35 PM  Result Value Ref Range   Glucose-Capillary 150 (H) 70 - 99 mg/dL  Glucose, capillary     Status: Abnormal   Collection Time: 12/24/20 11:54 PM  Result Value Ref Range   Glucose-Capillary 142 (H) 70 - 99 mg/dL  CBC     Status: Abnormal   Collection Time: 12/25/20  3:02 AM  Result Value Ref Range   WBC 6.5 4.0 - 10.5 K/uL   RBC 2.87 (L) 3.87 - 5.11 MIL/uL   Hemoglobin 9.0 (L) 12.0 - 15.0 g/dL   HCT 28.1 (L) 36.0 - 46.0 %   MCV 97.9 80.0 - 100.0 fL   MCH 31.4 26.0 - 34.0 pg   MCHC 32.0 30.0 - 36.0 g/dL   RDW 20.5 (H) 11.5 - 15.5 %   Platelets 202 150 - 400  K/uL   nRBC 0.0 0.0 - 0.2 %  Basic metabolic panel     Status: Abnormal   Collection Time: 12/25/20  3:02 AM  Result Value Ref Range   Sodium 136 135 - 145 mmol/L   Potassium 4.0 3.5 - 5.1 mmol/L   Chloride 93 (L) 98 - 111 mmol/L   CO2 31 22 - 32 mmol/L   Glucose, Bld 151 (H) 70 - 99 mg/dL   BUN 21 8 - 23 mg/dL   Creatinine, Ser 6.17 (H) 0.44 - 1.00 mg/dL   Calcium 8.2 (L) 8.9 - 10.3 mg/dL   GFR, Estimated 7 (L) >60 mL/min   Anion gap 12 5 - 15  Glucose, capillary     Status: Abnormal   Collection Time: 12/25/20  3:46 AM  Result Value Ref Range  Glucose-Capillary 137 (H) 70 - 99 mg/dL  Glucose, capillary     Status: Abnormal   Collection Time: 12/25/20  8:02 AM  Result Value Ref Range   Glucose-Capillary 157 (H) 70 - 99 mg/dL  Glucose, capillary     Status: Abnormal   Collection Time: 12/25/20 12:03 PM  Result Value Ref Range   Glucose-Capillary 148 (H) 70 - 99 mg/dL     PHYSICAL EXAM:   Gen: NAD, sleeping  Ext:       Right Lower Extremity              Dressings stable             Stump in good condition  Assessment/Plan: 2 Days Post-Op   Principal Problem:   Closed right hip fracture (HCC) Active Problems:   Type II diabetes mellitus with renal manifestations (HCC)   ESRD on dialysis (HCC)   Chronic combined systolic and diastolic CHF (congestive heart failure) (HCC)   Anemia in ESRD (end-stage renal disease) ([place])   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[place])   Anti-infectives (From admission, onward)   Start     Dose/Rate Route Frequency Ordered Stop   12/23/20 2200  ceFAZolin (ANCEF) IVPB 2g/100 mL premix  Status:  Discontinued        2 g 200 mL/hr over 30 Minutes Intravenous Every 6 hours 12/23/20 2103 12/23/20 2135   12/23/20 1658  ceFAZolin (ANCEF) 2-4 GM/100ML-% IVPB       Note to Pharmacy: Nyoka Cowden   : cabinet override      12/23/20 1658 12/24/20 0459    .  POD/HD#: 2  70 y/o female with R hip fracture, B AKAs   -fall   - R hip  fracture s/p IMN              ROM as tolerated              No restrictions with R leg             Dressing changes as needed   - Dispo:             Ortho issues stable             Continue per medicine service    Given her overall clinical picture, think plavix will suffice for VTE prophylaxis      Jari Pigg, PA-C [phone] (C) 12/25/2020, 2:30 PM  [facility] [place] [address] [phone] Jenetta [phone] (F)    After 5pm and on the weekends please log on to Amion, go to orthopaedics and the look under the [facility] Call for the provider(s) on call. You can also call our office at [phone] and then follow the prompts to be connected to the call team.

## 2020-12-25 NOTE — Plan of Care (Addendum)
Patient has soft BP. Notified Shalhoub about low BP, currently no new orders, per Shalhoub continue to monitor, pt has extensive cardiac hx. Will continue to monitor patient.   Problem: Education: Goal: Verbalization of understanding the information provided (i.e., activity precautions, restrictions, etc) will improve Outcome: Progressing   Problem: Activity: Goal: Ability to ambulate and perform ADLs will improve Outcome: Progressing   Problem: Pain Management: Goal: Pain level will decrease Outcome: Progressing

## 2020-12-25 NOTE — Progress Notes (Addendum)
[facility] Progress Note   Subjective: Seen in room. BP soft over night, probably related to pain medications. HD tomorrow on schedule.   Objective Vitals:   12/24/20 2115 12/24/20 2348 12/25/20 0350 12/25/20 0804  BP: (!) 116/46 (!) 113/45 105/61 (!) 105/35  Pulse: 86 79 89 85  Resp: 18 17 18 17   Temp: 98.7 F (37.1 C) 98.4 F (36.9 C) 98.2 F (36.8 C) 98.4 F (36.9 C)  TempSrc: Oral Oral Oral Oral  SpO2: 100% 100% 100% 90%  Weight:      Height:       Physical Exam General: Chronically ill appearing female in NAD Heart: F0,Y7 2/6 systolic M  Lungs: CTAB Abdomen: obese active BS Extremities: Bilateral AKA no stump edema Dialysis Access: R AVF +T/B      Additional Objective Labs: Basic Metabolic Panel: Recent Labs  Lab 12/23/20 1508 12/24/20 0329 12/25/20 0302  NA 139 137 136  K 4.9 5.0 4.0  CL 98 93* 93*  CO2 25 22 31   GLUCOSE 83 239* 151*  BUN 28* 34* 21  CREATININE 9.46* 10.36* 6.17*  CALCIUM 9.0 8.8* 8.2*  PHOS  --  5.9*  --    Liver Function Tests: No results for input(s): AST, ALT, ALKPHOS, BILITOT, PROT, ALBUMIN in the last 168 hours. No results for input(s): LIPASE, AMYLASE in the last 168 hours. CBC: Recent Labs  Lab 12/22/20 2300 12/24/20 0329 12/25/20 0302  WBC 9.8 5.2 6.5  NEUTROABS 7.3  --   --   HGB 9.7* 8.8* 9.0*  HCT 30.8* 27.8* 28.1*  MCV 99.4 99.6 97.9  PLT 235 204 202   Blood Culture    Component Value Date/Time   SDES BLOOD LEFT HAND 10/19/2019 1959   SPECREQUEST  10/19/2019 1959    BOTTLES DRAWN AEROBIC ONLY Blood Culture adequate volume   CULT  10/19/2019 1959    NO GROWTH 5 DAYS Performed at [facility], [place] [address] [place], [place] [address]    REPTSTATUS 10/24/2019 FINAL 10/19/2019 1959    Cardiac Enzymes: No results for input(s): CKTOTAL, CKMB, CKMBINDEX, TROPONINI in the last 168 hours. CBG: Recent Labs  Lab 12/24/20 1613 12/24/20 1935 12/24/20 2354 12/25/20 0346 12/25/20 0802   GLUCAP 169* 150* 142* 137* 157*   Iron Studies: No results for input(s): IRON, TIBC, TRANSFERRIN, FERRITIN in the last 72 hours. @lablastinr3 @ Studies/Results: DG C-Arm 1-60 Min  Result Date: 12/23/2020 CLINICAL DATA:  Intraoperative images of right femoral nail placement. EXAM: DG C-ARM 1-60 MIN CONTRAST:  None FLUOROSCOPY TIME:  Fluoroscopy Time:  54 seconds Radiation Exposure Index (if provided by the fluoroscopic device): Not reported Number of Acquired Spot Images: 8 COMPARISON:  None. FINDINGS: Intraoperative fluoroscopy was obtained during placement of a intramedullary nail within the right femur. IMPRESSION: Intraoperative fluoroscopy during placement of antegrade intramedullary nail with right femur. Electronically Signed   By: Ulyses Jarred M.D.   On: 12/23/2020 20:52   DG Hip Port Unilat With Pelvis 1V Right  Result Date: 12/23/2020 CLINICAL DATA:  Fracture EXAM: DG HIP (WITH OR WITHOUT PELVIS) 1V PORT RIGHT COMPARISON:  None. FINDINGS: Antegrade intramedullary nail with interlocking screw of the proximal right femur. Small amount of soft tissue gas. Atherosclerotic calcification. IMPRESSION: Internal fixation of proximal right femoral fracture without adverse features. Electronically Signed   By: Ulyses Jarred M.D.   On: 12/23/2020 20:49   DG FEMUR, MIN 2 VIEWS RIGHT  Result Date: 12/23/2020 CLINICAL DATA:  Intramedullary nail of the  right femur EXAM: RIGHT FEMUR 2 VIEWS COMPARISON:  None. FINDINGS: Eight fluoroscopic images were obtained during placement of a short intramedullary nail in the right femur. IMPRESSION: Successful placement of short intramedullary nail in the right femur. Electronically Signed   By: Ulyses Jarred M.D.   On: 12/23/2020 20:50   Medications:  . acetaminophen  1,000 mg Oral Q8H  . Chlorhexidine Gluconate Cloth  6 each Topical Q0600  . clopidogrel  75 mg Oral Daily  . docusate sodium  100 mg Oral BID  . doxercalciferol  3 mcg Intravenous Q M,W,F-HD  .  insulin aspart  0-5 Units Subcutaneous QHS  . insulin aspart  0-6 Units Subcutaneous TID WC  . lanthanum  1,000 mg Oral TID WC  . pantoprazole  40 mg Oral Daily     Dialysis Orders: [facility] MWF 4h 400/500 72.5kg 2K/2Ca bath UFP 2 AVF  -No heparin  -Hectorol 3 mcg IVTIW -Venofer 50 mg IV q wk -Mircera 225 q 2 weeks (last on 5/30)  Assessment/Plan: 1. R hip fx - management per ortho service 2. ESRD -HD MWF. Next HD 12/26/20.  3. Hypertension/volume - BP soft probably due to pain medications. Hold prior to HD if possible. No volume excess on exam. UF to dry weight as tolerated. 4. Anemia - Hgb 9.0 Recent ESA dose as outpatient. Follow trends 5. Metabolic bone disease -Ca ok. Continue Fosrenol binder, Hectorol  6. DMT2 -insulin per primary team 7. Nutrition -Renal diet w fluid restrictions  Amonie Wisser H. Brandun Pinn NP-C 12/25/2020, 9:33 AM  [employer] [phone]

## 2020-12-25 NOTE — TOC CAGE-AID Note (Signed)
Transition of Care [facility]) - CAGE-AID Screening   Patient Details  Name: [name] MRN: [id number] Date of Birth: 08/11/1950  [facility]) CM/SW Contact:    Clovis Cao, RN Phone Number: [phone] 12/25/2020, 8:55 AM   Clinical Narrative: Pt reports she does not drink or do drugs.   CAGE-AID Screening:    Have You Ever Felt You Ought to Cut Down on Your Drinking or Drug Use?: No Have People Annoyed You By Critizing Your Drinking Or Drug Use?: No Have You Felt Bad Or Guilty About Your Drinking Or Drug Use?: No Have You Ever Had a Drink or Used Drugs First Thing In The Morning to Steady Your Nerves or to Get Rid of a Hangover?: No CAGE-AID Score: 0  Substance Abuse Education Offered: No

## 2020-12-25 NOTE — Consult Note (Signed)
   [facility] CM Inpatient Consult   12/25/2020  [name] 1951-05-28 [id number]   Patient is currently active with [facility] for chronic disease management services.  Patient has been engaged by a [place] health coach. Per review, current recommendation is for skilled nursing facility or 24 hour supervision at home.  Plan: Will continue to follow for progression and disposition plans.   Of note, [facility] Care Management services does not replace or interfere with any services that are arranged by inpatient case management or social work.  [name], MSN, [facility] Liaison Nurse Mobile Phone [phone]  Toll free office [phone]

## 2020-12-26 LAB — CBC
HCT: 28.2 % — ABNORMAL LOW (ref 36.0–46.0)
Hemoglobin: 8.9 g/dL — ABNORMAL LOW (ref 12.0–15.0)
MCH: 31.2 pg (ref 26.0–34.0)
MCHC: 31.6 g/dL (ref 30.0–36.0)
MCV: 98.9 fL (ref 80.0–100.0)
Platelets: 184 10*3/uL (ref 150–400)
RBC: 2.85 MIL/uL — ABNORMAL LOW (ref 3.87–5.11)
RDW: 19.9 % — ABNORMAL HIGH (ref 11.5–15.5)
WBC: 5.2 10*3/uL (ref 4.0–10.5)
nRBC: 0 % (ref 0.0–0.2)

## 2020-12-26 LAB — BASIC METABOLIC PANEL
Anion gap: 14 (ref 5–15)
BUN: 29 mg/dL — ABNORMAL HIGH (ref 8–23)
CO2: 28 mmol/L (ref 22–32)
Calcium: 8.4 mg/dL — ABNORMAL LOW (ref 8.9–10.3)
Chloride: 95 mmol/L — ABNORMAL LOW (ref 98–111)
Creatinine, Ser: 7.57 mg/dL — ABNORMAL HIGH (ref 0.44–1.00)
GFR, Estimated: 5 mL/min — ABNORMAL LOW (ref >60)
Glucose, Bld: 84 mg/dL (ref 70–99)
Potassium: 3.9 mmol/L (ref 3.5–5.1)
Sodium: 137 mmol/L (ref 135–145)

## 2020-12-26 LAB — GLUCOSE, CAPILLARY
Glucose-Capillary: 88 mg/dL (ref 70–99)
Glucose-Capillary: 108 mg/dL — ABNORMAL HIGH (ref 70–99)
Glucose-Capillary: 67 mg/dL — ABNORMAL LOW (ref 70–99)
Glucose-Capillary: 126 mg/dL — ABNORMAL HIGH (ref 70–99)
Glucose-Capillary: 183 mg/dL — ABNORMAL HIGH (ref 70–99)

## 2020-12-26 LAB — HEMOGLOBIN A1C
Hgb A1c MFr Bld: 5.5 % (ref 4.8–5.6)
Mean Plasma Glucose: 111 mg/dL

## 2020-12-26 MED ORDER — DOXERCALCIFEROL 4 MCG/2ML IV SOLN
INTRAVENOUS | Status: AC
  Administered 2020-12-26: 3 ug via INTRAVENOUS
  Filled 2020-12-26: qty 2

## 2020-12-26 NOTE — Progress Notes (Signed)
PROGRESS NOTE    [name]  QPY:[id number] DOB: Jan 13, 1951 DOA: 12/22/2020 PCP: Prince Solian, MD   Brief Narrative:  [name] is a 70 y.o. female with medical history significant for ESRD on HD Monday, Wednesday and Fridays, DMT2, HTN, CHF, HLD, blindness due to glaucoma, bilateral AKA status.  She presents after mechanical fall at home, having fallen asleep in her wheelchair without the break on and it rolled out from under her.  She presented to the ED for right hip pain, imaging notable for femur fracture, orthopedics consulted and hospitalist called for admission.  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, vascular, and ophthalmologic manifestations -ACHS/sliding scale ongoing -Patient ESRD, 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 with bilateral amputations in the setting of vasculopathy unlikely in the setting of poorly controlled diabetes history  -Repeat A1C pending - last 5.2 in 11/21  ESRD on dialysis Monday Wednesday Friday - Nephrology following, appreciate insight and recommendations, continue dialysis per their expertise   Chronic systolic and diastolic congestive heart failure without acute exacerbation  -Patient appears stable, euvolemic, fluid management likely through dialysis at this point  Chronic anemia of chronic disease in setting of ESRD  -Continue to follow, defer to nephrology for EPO/similar  injections as needed  S/P AKA (above knee amputation) bilaterally Chronic -likely secondary to uncontrolled diabetes and vasculopathy  Bilateral glaucoma and blindness -Chronic again likely in the setting of uncontrolled diabetes history  DVT prophylaxis:    Currently holding perioperatively, defer to orthopedic and trauma surgery for postoperative DVT prophylaxis Code Status: Full Code  Family Communication: At bedside  Status is: Inpatient  Dispo: The patient is from: Home              Anticipated d/c is to: Likely SNF per discussion with family today as patient cannot transfer out of bed to wheelchair which is her previous baseline              Anticipated d/c date is: 24-48 hours              Patient currently not medically stable for discharge  Consultants:   Orthopedic surgery, trauma surgery, nephrology  Procedures:   Right femur fracture repair 12/23/2020  Antimicrobials:  None indicated  Subjective: No acute issues or events overnight, patient evaluated hemodialysis without any acute issues or complaints.  She is requesting discharge home which we discussed would depend on her ability to help transition and transfer from chair to bed and back given her limited assistance at home otherwise she would need to be discharged rehab and physical therapy per discussion with family.  Objective: Vitals:   12/25/20 0804 12/25/20 1456 12/25/20 2104 12/26/20 0500  BP: (!) 105/35 (!) 119/46 104/73 (!) 131/48  Pulse: 85 70 78 84  Resp: 17 18 17 18   Temp: 98.4 F (36.9 C) 99.7 F (37.6 C) 97.8 F (36.6 C) 98.2 F (36.8 C)  TempSrc: Oral Oral Oral Oral  SpO2: 90% 100% 100%  97%  Weight:      Height:       No intake or output data in the 24 hours ending 12/26/20 0754 Filed Weights   12/23/20 1619 12/24/20 0800 12/24/20 1215  Weight: 75.5 kg 75 kg 73.2 kg    Examination:  General:  Pleasantly resting in bed, No acute distress.  Somewhat somnolent but answering questions  appropriately at her baseline. HEENT:  Normocephalic atraumatic.  Sclerae nonicteric, noninjected.  Extraocular movements intact bilaterally. Neck:  Without mass or deformity.  Trachea is midline. Lungs:  Clear to auscultate bilaterally without rhonchi, wheeze, or rales. Heart:  Regular rate and rhythm.  Without murmurs, rubs, or gallops. Abdomen:  Soft, nontender, nondistended.  Without guarding or rebound. Extremities: Bilateral AKA, right hip tender to palpation/range of motion - bandage clean/dry/intact Vascular:  Dorsalis pedis and posterior tibial pulses palpable bilaterally. Skin:  Warm and dry, no erythema, no ulcerations-right leg bandage clean dry intact.   Data Reviewed: I have personally reviewed following labs and imaging studies  CBC: Recent Labs  Lab 12/22/20 2300 12/24/20 0329 12/25/20 0302 12/26/20 0324  WBC 9.8 5.2 6.5 5.2  NEUTROABS 7.3  --   --   --   HGB 9.7* 8.8* 9.0* 8.9*  HCT 30.8* 27.8* 28.1* 28.2*  MCV 99.4 99.6 97.9 98.9  PLT 235 204 202 606   Basic Metabolic Panel: Recent Labs  Lab 12/22/20 2300 12/23/20 1508 12/24/20 0329 12/25/20 0302 12/26/20 0324  NA 137 139 137 136 137  K 3.9 4.9 5.0 4.0 3.9  CL 96* 98 93* 93* 95*  CO2 27 25 22 31 28   GLUCOSE 108* 83 239* 151* 84  BUN 24* 28* 34* 21 29*  CREATININE 8.41* 9.46* 10.36* 6.17* 7.57*  CALCIUM 9.1 9.0 8.8* 8.2* 8.4*  PHOS  --   --  5.9*  --   --    GFR: Estimated Creatinine Clearance: 7.1 mL/min (A) (by C-G formula based on SCr of 7.57 mg/dL (H)). Liver Function Tests: No results for input(s): AST, ALT, ALKPHOS, BILITOT, PROT, ALBUMIN in the last 168 hours. No results for input(s): LIPASE, AMYLASE in the last 168 hours. No results for input(s): AMMONIA in the last 168 hours. Coagulation Profile: No results for input(s): INR, PROTIME in the last 168 hours. Cardiac Enzymes: No results for input(s): CKTOTAL, CKMB, CKMBINDEX, TROPONINI in the last 168 hours. BNP (last 3 results) No  results for input(s): PROBNP in the last 8760 hours. HbA1C: Recent Labs    12/25/20 0302  HGBA1C 5.5   CBG: Recent Labs  Lab 12/25/20 0802 12/25/20 1203 12/25/20 1611 12/25/20 2101 12/26/20 0637  GLUCAP 157* 148* 108* 127* 88   Lipid Profile: No results for input(s): CHOL, HDL, LDLCALC, TRIG, CHOLHDL, LDLDIRECT in the last 72 hours. Thyroid Function Tests: No results for input(s): TSH, T4TOTAL, FREET4, T3FREE, THYROIDAB in the last 72 hours. Anemia Panel: No results for input(s): VITAMINB12, FOLATE, FERRITIN, TIBC, IRON, RETICCTPCT in the last 72 hours. Sepsis Labs: No results for input(s): PROCALCITON, LATICACIDVEN in the last 168 hours.  Recent Results (from the past 240 hour(s))  Resp Panel by RT-PCR (Flu A&B, Covid) Nasopharyngeal Swab     Status: None   Collection Time: 12/22/20 11:00 PM   Specimen: Nasopharyngeal Swab; Nasopharyngeal(NP) swabs in vial transport medium  Result Value Ref Range Status   SARS Coronavirus 2 by RT PCR NEGATIVE NEGATIVE Final    Comment: (NOTE) SARS-CoV-2 target nucleic acids are NOT DETECTED.  The SARS-CoV-2 RNA is  generally detectable in upper respiratory specimens during the acute phase of infection. The lowest concentration of SARS-CoV-2 viral copies this assay can detect is 138 copies/mL. A negative result does not preclude SARS-Cov-2 infection and should not be used as the sole basis for treatment or other patient management decisions. A negative result may occur with  improper specimen collection/handling, submission of specimen other than nasopharyngeal swab, presence of viral mutation(s) within the areas targeted by this assay, and inadequate number of viral copies(<138 copies/mL). A negative result must be combined with clinical observations, patient history, and epidemiological information. The expected result is Negative.  Fact Sheet for Patients:  EntrepreneurPulse.com.au  Fact Sheet for Healthcare  Providers:  IncredibleEmployment.be  This test is no t yet approved or cleared by the [place] FDA and  has been authorized for detection and/or diagnosis of SARS-CoV-2 by FDA under an Emergency Use Authorization (EUA). This EUA will remain  in effect (meaning this test can be used) for the duration of the COVID-19 declaration under Section 564(b)(1) of the Act, 21 U.S.C.section 360bbb-3(b)(1), unless the authorization is terminated  or revoked sooner.       Influenza A by PCR NEGATIVE NEGATIVE Final   Influenza B by PCR NEGATIVE NEGATIVE Final    Comment: (NOTE) The Xpert Xpress SARS-CoV-2/FLU/RSV plus assay is intended as an aid in the diagnosis of influenza from Nasopharyngeal swab specimens and should not be used as a sole basis for treatment. Nasal washings and aspirates are unacceptable for Xpert Xpress SARS-CoV-2/FLU/RSV testing.  Fact Sheet for Patients: EntrepreneurPulse.com.au  Fact Sheet for Healthcare Providers: IncredibleEmployment.be  This test is not yet approved or cleared by the [place] FDA and has been authorized for detection and/or diagnosis of SARS-CoV-2 by FDA under an Emergency Use Authorization (EUA). This EUA will remain in effect (meaning this test can be used) for the duration of the COVID-19 declaration under Section 564(b)(1) of the Act, 21 U.S.C. section 360bbb-3(b)(1), unless the authorization is terminated or revoked.  Performed at [facility], [place] [address], [place], [place] [address]   Surgical pcr screen     Status: Abnormal   Collection Time: 12/23/20  3:05 PM   Specimen: Nasal Mucosa; Nasal Swab  Result Value Ref Range Status   MRSA, PCR POSITIVE (A) NEGATIVE Final    Comment: RESULT CALLED TO, READ BACK BY AND VERIFIED WITH: RN Chari Manning ON [id number] AT 1478 BY E.PARRISH    Staphylococcus aureus POSITIVE (A) NEGATIVE Final    Comment: (NOTE) The Xpert SA Assay  (FDA approved for NASAL specimens in patients 60 years of age and older), is one component of a comprehensive surveillance program. It is not intended to diagnose infection nor to guide or monitor treatment. Performed at [facility], [place] [address] [place], [place] [address]          Radiology Studies: No results found. Scheduled Meds: . acetaminophen  1,000 mg Oral Q8H  . Chlorhexidine Gluconate Cloth  6 each Topical Q0600  . clopidogrel  75 mg Oral Daily  . docusate sodium  100 mg Oral BID  . doxercalciferol  3 mcg Intravenous Q M,W,F-HD  . insulin aspart  0-5 Units Subcutaneous QHS  . insulin aspart  0-6 Units Subcutaneous TID WC  . lanthanum  1,000 mg Oral TID WC  . pantoprazole  40 mg Oral Daily   Continuous Infusions:   LOS: 4 days   Time spent: 73min  Jahkari Maclin C Tammatha Cobb, DO [facility]  If 7PM-7AM, please contact night-coverage [email or web address]  12/26/2020, 7:54 AM

## 2020-12-26 NOTE — Progress Notes (Signed)
Pt to HD

## 2020-12-26 NOTE — Plan of Care (Signed)
  Problem: Activity: Goal: Ability to ambulate and perform ADLs will improve Outcome: Progressing   Problem: Pain Management: Goal: Pain level will decrease Outcome: Progressing

## 2020-12-26 NOTE — Progress Notes (Addendum)
[facility] Progress Note   Subjective: Seen on HD. No pain meds prior to HD BP is stable. Anticipating DC to SNF.     Objective Vitals:   12/26/20 0500 12/26/20 0746 12/26/20 0754 12/26/20 0800  BP: (!) 131/48 (!) 119/41 (!) 128/44 (!) 130/46  Pulse: 84 82 83 79  Resp: 18 14 14 14   Temp: 98.2 F (36.8 C) 98 F (36.7 C)    TempSrc: Oral Oral    SpO2: 97% 98% 99% 98%  Weight:  72.3 kg    Height:       Physical Exam General:Chronically ill appearing female in NAD PNTIR:W4,R1 2/6 systolic M Lungs:CTAB Abdomen:obese active BS Extremities:Bilateral AKA no stump edema Dialysis Access:R AVF +T/B     Additional Objective Labs: Basic Metabolic Panel: Recent Labs  Lab 12/24/20 0329 12/25/20 0302 12/26/20 0324  NA 137 136 137  K 5.0 4.0 3.9  CL 93* 93* 95*  CO2 22 31 28   GLUCOSE 239* 151* 84  BUN 34* 21 29*  CREATININE 10.36* 6.17* 7.57*  CALCIUM 8.8* 8.2* 8.4*  PHOS 5.9*  --   --    Liver Function Tests: No results for input(s): AST, ALT, ALKPHOS, BILITOT, PROT, ALBUMIN in the last 168 hours. No results for input(s): LIPASE, AMYLASE in the last 168 hours. CBC: Recent Labs  Lab 12/22/20 2300 12/24/20 0329 12/25/20 0302 12/26/20 0324  WBC 9.8 5.2 6.5 5.2  NEUTROABS 7.3  --   --   --   HGB 9.7* 8.8* 9.0* 8.9*  HCT 30.8* 27.8* 28.1* 28.2*  MCV 99.4 99.6 97.9 98.9  PLT 235 204 202 184   Blood Culture    Component Value Date/Time   SDES BLOOD LEFT HAND 10/19/2019 1959   SPECREQUEST  10/19/2019 1959    BOTTLES DRAWN AEROBIC ONLY Blood Culture adequate volume   CULT  10/19/2019 1959    NO GROWTH 5 DAYS Performed at [facility], [place] [address], [place], [place] [address]    REPTSTATUS 10/24/2019 FINAL 10/19/2019 1959    Cardiac Enzymes: No results for input(s): CKTOTAL, CKMB, CKMBINDEX, TROPONINI in the last 168 hours. CBG: Recent Labs  Lab 12/25/20 0802 12/25/20 1203 12/25/20 1611 12/25/20 2101 12/26/20 0637  GLUCAP  157* 148* 108* 127* 88   Iron Studies: No results for input(s): IRON, TIBC, TRANSFERRIN, FERRITIN in the last 72 hours. @lablastinr3 @ Studies/Results: No results found. Medications:  . acetaminophen  1,000 mg Oral Q8H  . Chlorhexidine Gluconate Cloth  6 each Topical Q0600  . clopidogrel  75 mg Oral Daily  . docusate sodium  100 mg Oral BID  . doxercalciferol  3 mcg Intravenous Q M,W,F-HD  . insulin aspart  0-5 Units Subcutaneous QHS  . insulin aspart  0-6 Units Subcutaneous TID WC  . lanthanum  1,000 mg Oral TID WC  . pantoprazole  40 mg Oral Daily     Dialysis Orders: [facility] MWF 4h 400/500 72.5kg 2K/2Ca bath UFP 2 AVF -No heparin -Hectorol3 mcg IVTIW -Venofer 50mg  IVq wk -Mircera 225 q 2 weeks (last on 5/30)  Assessment/Plan: 1. R hip fx - S/P ORIF 12/23/20. Per primary/ortho. 2. ESRD -HD MWF. Next HD 12/26/20.  3. Hypertension/volume - BP soft probably due to pain medications. Hold prior to HD if possible. No volume excess on exam. UF to dry weight as tolerated. 4. Anemia - Hgb8.9. Recent ESA dose as outpatient. Follow trends 5. Metabolic bone disease -Ca ok. Continue Fosrenol binder, Hectorol  6. DMT2 -insulin per  primary team 7. Nutrition -Renal diet w fluid restrictions  Rita H. Brown NP-C 12/26/2020, 8:28 AM  [facility] [phone]  Nephrology attending: Patient was seen and examined at dialysis unit.  Chart reviewed.  I agree with assessment plan as outlined above. 70 year old female ESRD on HD admitted with right hip fracture status post ORIF on 6/5, recovering well.  Tolerating dialysis well.  Probably discharge to rehab.  Katheran James, MD [facility].

## 2020-12-26 NOTE — Progress Notes (Signed)
Physical Therapy Treatment Patient Details Name: [name] MRN: [id number] DOB: 07/22/1950 Today's Date: 12/26/2020    History of Present Illness [name] is a 70 y.o. female who was admitted with a right hip fracture following a fall at home. She fell out of her wheelchair. pt s/p IM nail 6/5. PMH: ESRD on HD MWF, DMT2, HTN, CHF, PAD s/p bilateral AKA, blindness.    PT Comments    Pt more alert, oriented, and engaging in conversation this session. Pt required increased assist for transfer to EOB however pt was able to scoot backwards into recliner with minimal assist. Pt reports she performs ant/post transfers at home in/out of her w/c with little assist from her sister, son, or daughter. It appears with exception of bed mobility pt is a baseline. Pt with minimal R hip pain. Acute PT to cont to follow.    Follow Up Recommendations  Home health PT;Supervision/Assistance - 24 hour     Equipment Recommendations  None recommended by PT (pt has all needed equip)    Recommendations for Other Services       Precautions / Restrictions Precautions Precautions: Fall Precaution Comments: bilat AKA; bilat blindness Restrictions Weight Bearing Restrictions: Yes RLE Weight Bearing: Weight bearing as tolerated    Mobility  Bed Mobility Overal bed mobility: Needs Assistance Bed Mobility: Supine to Sit     Supine to sit: Mod assist;+2 for physical assistance;+2 for safety/equipment     General bed mobility comments: modAx2 for trunk elevation and to pvt to EOB due to pt with R hip pain with initial bending and unfamilar with bed set up    Transfers Overall transfer level: Needs assistance   Transfers: Comptroller transfers: Min assist;+2 physical assistance (precautionary +2 as pt first time attempting)   General transfer comment: pt instructed PT and OT how she transfers backwards and forwards in/out of her wheel chair. with  minAx2 initally front/back pt able to scoot self backwards to chair, PT/OT then went on either side to assist pt with clearing the geomat in the recliner  Ambulation/Gait             General Gait Details: pt non-ambulatory   Stairs             Wheelchair Mobility    Modified Rankin (Stroke Patients Only)       Balance Overall balance assessment: Needs assistance Sitting-balance support: Feet unsupported;No upper extremity supported Sitting balance-Leahy Scale: Fair Sitting balance - Comments: close min guard, pt did not support self with hands                                    Cognition Arousal/Alertness: Awake/alert Behavior During Therapy: WFL for tasks assessed/performed Overall Cognitive Status: Within Functional Limits for tasks assessed                                 General Comments: pt alert and oriented, able to instruct PT/OT on how she transfers, approriate, willing to work, able to follow commands      Exercises      General Comments General comments (skin integrity, edema, etc.): R hip dressing intact, no breakdown on bttom      Pertinent Vitals/Pain Pain Assessment: Faces Faces Pain Scale: Hurts a little bit Pain Location: R hip  Pain Descriptors / Indicators: Grimacing;Guarding Pain Intervention(s): Monitored during session    Home Living                      Prior Function            PT Goals (current goals can now be found in the care plan section) Progress towards PT goals: Progressing toward goals    Frequency    Min 3X/week      PT Plan Current plan remains appropriate    Co-evaluation PT/OT/SLP Co-Evaluation/Treatment: Yes Reason for Co-Treatment: Complexity of the patient's impairments (multi-system involvement) PT goals addressed during session: Mobility/safety with mobility        AM-PAC PT "6 Clicks" Mobility   Outcome Measure  Help needed turning from your back to  your side while in a flat bed without using bedrails?: A Lot Help needed moving from lying on your back to sitting on the side of a flat bed without using bedrails?: A Little Help needed moving to and from a bed to a chair (including a wheelchair)?: A Little Help needed standing up from a chair using your arms (e.g., wheelchair or bedside chair)?: Total Help needed to walk in hospital room?: Total Help needed climbing 3-5 steps with a railing? : Total 6 Click Score: 11    End of Session   Activity Tolerance: Patient tolerated treatment well Patient left: in chair;with call bell/phone within reach;with chair alarm set Nurse Communication: Mobility status (to have pt transfer anteriorly from chair to bed) PT Visit Diagnosis: Unsteadiness on feet (R26.81)     Time: 9191-6606 PT Time Calculation (min) (ACUTE ONLY): 30 min  Charges:  $Therapeutic Activity: 8-22 mins                     [name], PT, DPT Acute Rehabilitation Services Pager #: [phone] Office #: [phone]    Berline Lopes 12/26/2020, 1:38 PM

## 2020-12-26 NOTE — Progress Notes (Signed)
Hypoglycemic Event  CBG: 67  Treatment: 4oz orange juice  Symptoms: none  Follow-up CBG: Time: 1319 CBG Result: 108  Possible Reasons for Event: Didn't eat breakfast due to having HD  Comments/MD notified:    Debbora Dus

## 2020-12-27 LAB — GLUCOSE, CAPILLARY
Glucose-Capillary: 122 mg/dL — ABNORMAL HIGH (ref 70–99)
Glucose-Capillary: 252 mg/dL — ABNORMAL HIGH (ref 70–99)

## 2020-12-27 LAB — CBC
HCT: 27.9 % — ABNORMAL LOW (ref 36.0–46.0)
Hemoglobin: 9 g/dL — ABNORMAL LOW (ref 12.0–15.0)
MCH: 31.3 pg (ref 26.0–34.0)
MCHC: 32.3 g/dL (ref 30.0–36.0)
MCV: 96.9 fL (ref 80.0–100.0)
Platelets: 191 10*3/uL (ref 150–400)
RBC: 2.88 MIL/uL — ABNORMAL LOW (ref 3.87–5.11)
RDW: 19.7 % — ABNORMAL HIGH (ref 11.5–15.5)
WBC: 5.7 10*3/uL (ref 4.0–10.5)
nRBC: 0 % (ref 0.0–0.2)

## 2020-12-27 LAB — BASIC METABOLIC PANEL
Anion gap: 16 — ABNORMAL HIGH (ref 5–15)
BUN: 13 mg/dL (ref 8–23)
CO2: 23 mmol/L (ref 22–32)
Calcium: 8.6 mg/dL — ABNORMAL LOW (ref 8.9–10.3)
Chloride: 95 mmol/L — ABNORMAL LOW (ref 98–111)
Creatinine, Ser: 4.67 mg/dL — ABNORMAL HIGH (ref 0.44–1.00)
GFR, Estimated: 10 mL/min — ABNORMAL LOW (ref >60)
Glucose, Bld: 146 mg/dL — ABNORMAL HIGH (ref 70–99)
Potassium: 3.3 mmol/L — ABNORMAL LOW (ref 3.5–5.1)
Sodium: 134 mmol/L — ABNORMAL LOW (ref 135–145)

## 2020-12-27 MED ORDER — HYDROMORPHONE HCL 2 MG PO TABS: 2.0000 mg | ORAL_TABLET | Freq: Four times a day (QID) | ORAL | 0 refills | Status: AC | PRN

## 2020-12-27 MED ORDER — APIXABAN 2.5 MG PO TABS: 2.5000 mg | ORAL_TABLET | Freq: Two times a day (BID) | ORAL | 0 refills | Status: DC

## 2020-12-27 MED ORDER — ACETAMINOPHEN 500 MG PO TABS: 1000.0000 mg | ORAL_TABLET | Freq: Three times a day (TID) | ORAL | 0 refills | Status: AC

## 2020-12-27 MED ORDER — DOCUSATE SODIUM 100 MG PO CAPS: 100.0000 mg | ORAL_CAPSULE | Freq: Two times a day (BID) | ORAL | 0 refills | Status: AC

## 2020-12-27 MED ORDER — APIXABAN 2.5 MG PO TABS
2.5000 mg | ORAL_TABLET | Freq: Two times a day (BID) | ORAL | Status: DC
  Administered 2020-12-27: 2.5 mg via ORAL
  Filled 2020-12-27: qty 1

## 2020-12-27 NOTE — Progress Notes (Signed)
Pt and sister given discharge instructions and gone over with them. They verbalized understanding. Bandages changed. Pt sister taking her home. Pt in no distress at discharge.

## 2020-12-27 NOTE — TOC Transition Note (Signed)
Transition of Care [facility]) - CM/SW Discharge Note   Patient Details  Name: [name] MRN: [id number] Date of Birth: [birth year]  [facility] of [facility] CM/SW Contact:  Milinda Antis, [place] Phone Number: 12/27/2020, 11:46 AM   Clinical Narrative:    Patient will DC to: Home with home health Anticipated DC date: 12/27/2020 Family notified: yes Transport by: Family   Per MD patient ready for DC home.  The patient will have 24 hour care with the assistance of family.  The patient's sister live the patient.  Home health services will resume with [facility].  CSW contacted the agency and spoke with Levada Dy to inform of d/c and confirm resumption of services.    The patient and family verified having DME at the home.    CSW will sign off for now as social work intervention is no longer needed. Please consult [place] again if new needs arise.     Final next level of care: Home w Home Health Services Barriers to Discharge: Barriers Resolved   Patient Goals and CMS Choice Patient states their goals for this hospitalization and ongoing recovery are:: Go home with her sister CMS Medicare.gov Compare Post Acute Care list provided to:: Patient Represenative (must comment) Choice offered to / list presented to :  (patient's sister regina)  Discharge Placement                  Name of family member notified: Aastha, [place] (Sister)   [phone]    Discharge Plan and Services                          HH Arranged: PT, OT, Social Work, Nurse's Aide, RN [place] Agency:  Chiropractor) Date [place]: 12/27/20 Time [place]: 6301 Representative spoke with at [place]: [place] ([place]) Interventions     Readmission Risk Interventions Readmission Risk Prevention Plan 11/18/2019 10/24/2019 10/21/2019  Transportation Screening Complete - Complete  PCP or Specialist Appt within 3-5 Days - - Complete  HRI or [place] - - Complete   Social Work Consult for [place] Planning/Counseling - - Complete  Palliative Care Screening - - Complete  Medication Review Press photographer) Complete - Complete  PCP or Specialist appointment within 3-5 days of discharge Complete Complete -  [place] or Home Care Consult Complete Complete -  SW Recovery Care/Counseling Consult Complete Complete -  Palliative Care Screening Complete Not Applicable -  [place] Complete Patient Refused -  Some recent data might be hidden

## 2020-12-27 NOTE — Plan of Care (Signed)
  Problem: Activity: Goal: Ability to ambulate and perform ADLs will improve Outcome: Progressing   Problem: Activity: Goal: Risk for activity intolerance will decrease Outcome: Progressing   Problem: Pain Managment: Goal: General experience of comfort will improve Outcome: Adequate for Discharge   Problem: Safety: Goal: Ability to remain free from injury will improve Outcome: Adequate for Discharge

## 2020-12-27 NOTE — Progress Notes (Addendum)
[facility] Progress Note   Subjective: Seen in room, upset says she just wants to go home. Says she doesn't need to go to a rehab and that they did not help her before. Emotional support to pt. HD tomorrow on schedule.    Objective Vitals:   12/26/20 1200 12/26/20 1240 12/26/20 2117 12/27/20 0932  BP: (!) 143/52 (!) 127/50 (!) 128/45 95/66  Pulse: 78 87 78 92  Resp: 14 15 18 16   Temp: 97.8 F (36.6 C)  97.9 F (36.6 C) 98.3 F (36.8 C)  TempSrc: Oral  Oral Oral  SpO2: 100% 100% 97% 93%  Weight: 70 kg     Height:       Physical Exam General: Chronically ill appearing female in NAD Heart: W7,P7 2/6 systolic M  Lungs: CTAB Abdomen: obese active BS Extremities: Bilateral AKA no stump edema Dialysis Access: R AVF +T/B     Additional Objective Labs: Basic Metabolic Panel: Recent Labs  Lab 12/24/20 0329 12/25/20 0302 12/26/20 0324 12/27/20 0143  NA 137 136 137 134*  K 5.0 4.0 3.9 3.3*  CL 93* 93* 95* 95*  CO2 22 31 28 23   GLUCOSE 239* 151* 84 146*  BUN 34* 21 29* 13  CREATININE 10.36* 6.17* 7.57* 4.67*  CALCIUM 8.8* 8.2* 8.4* 8.6*  PHOS 5.9*  --   --   --    Liver Function Tests: No results for input(s): AST, ALT, ALKPHOS, BILITOT, PROT, ALBUMIN in the last 168 hours. No results for input(s): LIPASE, AMYLASE in the last 168 hours. CBC: Recent Labs  Lab 12/22/20 2300 12/24/20 0329 12/25/20 0302 12/26/20 0324 12/27/20 0143  WBC 9.8 5.2 6.5 5.2 5.7  NEUTROABS 7.3  --   --   --   --   HGB 9.7* 8.8* 9.0* 8.9* 9.0*  HCT 30.8* 27.8* 28.1* 28.2* 27.9*  MCV 99.4 99.6 97.9 98.9 96.9  PLT 235 204 202 184 191   Blood Culture    Component Value Date/Time   SDES BLOOD LEFT HAND 10/19/2019 1959   SPECREQUEST  10/19/2019 1959    BOTTLES DRAWN AEROBIC ONLY Blood Culture adequate volume   CULT  10/19/2019 1959    NO GROWTH 5 DAYS Performed at [facility] [address] [place], [place] [address]    REPTSTATUS 10/24/2019 FINAL 10/19/2019  1959    Cardiac Enzymes: No results for input(s): CKTOTAL, CKMB, CKMBINDEX, TROPONINI in the last 168 hours. CBG: Recent Labs  Lab 12/26/20 1241 12/26/20 1319 12/26/20 1739 12/26/20 2110 12/27/20 0744  GLUCAP 67* 108* 126* 183* 122*   Iron Studies: No results for input(s): IRON, TIBC, TRANSFERRIN, FERRITIN in the last 72 hours. @lablastinr3 @ Studies/Results: No results found. Medications:   acetaminophen  1,000 mg Oral Q8H   Chlorhexidine Gluconate Cloth  6 each Topical Q0600   clopidogrel  75 mg Oral Daily   docusate sodium  100 mg Oral BID   doxercalciferol  3 mcg Intravenous Q M,W,F-HD   insulin aspart  0-5 Units Subcutaneous QHS   insulin aspart  0-6 Units Subcutaneous TID WC   lanthanum  1,000 mg Oral TID WC   pantoprazole  40 mg Oral Daily     Dialysis Orders:  [facility] MWF 4h  400/500 72.5kg 2K/2Ca bath UFP 2 AVF  -No heparin  -Hectorol 3 mcg IVTIW -Venofer 50 mg IV q wk -Mircera 225 q 2 weeks (last on 5/30)    Assessment/Plan: R hip fx - S/P ORIF 12/23/20. Per primary/ortho. ESRD -  HD MWF. Next HD 12/28/20.  Hypertension/volume  - BP soft probably due to pain medications. Hold prior to HD if possible. HD 06/08 Net UF 2 liters. Now under EDW. Minimal UF with HD tomorrow.  Anemia  - Hgb 9.0. Next ESA due 12/31/2020. Follow trends  Metabolic bone disease -  Ca ok. Continue Fosrenol binder, Hectorol DMT2 -insulin per primary team  Nutrition - Renal diet w fluid restrictions   Rita H. Brown NP-C 12/27/2020, 9:58 AM  [facility] [phone]  Nephrology attending: Patient was seen and examined.  Chart reviewed.  I agree with assessment plan as outlined above. ESRD on HD admitted with hip fracture underwent surgery.  Recovering well.  She is tolerating dialysis well the last treatment was yesterday with 2 L ultrafiltration.  Plan for next treatment tomorrow.  Ok to discharge home from renal perspective.   Katheran James, MD [facility].

## 2020-12-27 NOTE — Discharge Instructions (Addendum)
Orthopaedic Discharge instructions  R hip fracture treated with intramedullary nail Can use R hip as pain allows, no restrictions  Daily dressing changes to R hip as needed. Ok to leave incisions open to air once they are dry  Clean wound with soap and water only   Ice as needed for swelling and pain   Follow up with orthopaedics in 2 weeks  [place]   Discharge Wound Care Instructions  Do NOT apply any ointments, solutions or lotions to pin sites or surgical wounds.  These prevent needed drainage and even though solutions like hydrogen peroxide kill bacteria, they also damage cells lining the pin sites that help fight infection.  Applying lotions or ointments can keep the wounds moist and can cause them to breakdown and open up as well. This can increase the risk for infection. When in doubt call the office.  Surgical incisions should be dressed daily.  If any drainage is noted, use one layer of adaptic, then gauze, Kerlix, and an ace wrap.  Once the incision is completely dry and without drainage, it may be left open to air out.  Showering may begin 36-48 hours later.  Cleaning gently with soap and water.  Traumatic wounds should be dressed daily as well.    One layer of adaptic, gauze, Kerlix, then ace wrap.  The adaptic can be discontinued once the draining has ceased    If you have a wet to dry dressing: wet the gauze with saline the squeeze as much saline out so the gauze is moist (not soaking wet), place moistened gauze over wound, then place a dry gauze over the moist one, followed by Kerlix wrap, then ace wrap.  CALL OFFICE WITH QUESTIONS OR CONCERNS [phone]       Information on my medicine - ELIQUIS (apixaban)  Why was Eliquis prescribed for you? Eliquis was prescribed for you to reduce the risk of blood clots forming after orthopedic surgery.    What do You need to know about Eliquis? Take your Eliquis TWICE DAILY - one tablet  in the morning and one tablet in the evening with or without food.  It would be best to take the dose about the same time each day.  If you have difficulty swallowing the tablet whole please discuss with your pharmacist how to take the medication safely.  Take Eliquis exactly as prescribed by your doctor and DO NOT stop taking Eliquis without talking to the doctor who prescribed the medication.  Stopping without other medication to take the place of Eliquis may increase your risk of developing a clot.  After discharge, you should have regular check-up appointments with your healthcare provider that is prescribing your Eliquis.  What do you do if you miss a dose? If a dose of ELIQUIS is not taken at the scheduled time, take it as soon as possible on the same day and twice-daily administration should be resumed.  The dose should not be doubled to make up for a missed dose.  Do not take more than one tablet of ELIQUIS at the same time.  Important Safety Information A possible side effect of Eliquis is bleeding. You should call your healthcare provider right away if you experience any of the following: Bleeding from an injury or your nose that does not stop. Unusual colored urine (red or dark brown) or unusual colored stools (red or black). Unusual bruising for unknown reasons. A serious fall or if you hit your head (even if there  is no bleeding).  Some medicines may interact with Eliquis and might increase your risk of bleeding or clotting while on Eliquis. To help avoid this, consult your healthcare provider or pharmacist prior to using any new prescription or non-prescription medications, including herbals, vitamins, non-steroidal anti-inflammatory drugs (NSAIDs) and supplements.  This website has more information on Eliquis (apixaban): http://www.eliquis.com/eliquis/home

## 2020-12-27 NOTE — Progress Notes (Signed)
Pt refused CBG at this time °

## 2020-12-27 NOTE — Progress Notes (Signed)
Physical Therapy Treatment Patient Details Name: [name] MRN: [id number] DOB: 1951-01-10 Today's Date: 12/27/2020    History of Present Illness [name] is a 70 y.o. female who was admitted with a right hip fracture following a fall at home. She fell out of her wheelchair. pt s/p IM nail 6/5. PMH: ESRD on HD MWF, DMT2, HTN, CHF, PAD s/p bilateral AKA, blindness.    PT Comments    Pt supine in bed on arrival.  Performed LE hip exercises and practice transfer from bed to recliner.  Sister at her bedside and reports she will need assistance from staff to transfer into car but has support at home to transfer Out of the care.  Continue to recommend HHPT.     Follow Up Recommendations  Home health PT;Supervision/Assistance - 24 hour     Equipment Recommendations  None recommended by PT    Recommendations for Other Services       Precautions / Restrictions Precautions Precautions: Fall Precaution Comments: bilat AKA; bilat blindness Restrictions Weight Bearing Restrictions: Yes RLE Weight Bearing: Weight bearing as tolerated    Mobility  Bed Mobility   Bed Mobility: Rolling (Min to roll to L and mod to roll to the R.) Rolling: Min assist;Mod assist (with rail.)   Supine to sit: Min assist (Used PTA as a railing to move into long sitting.) Sit to supine: Supervision   General bed mobility comments: Pt performed all aspects of mobility with use of rails and various assist levels.    Transfers Overall transfer level: Needs assistance Equipment used: None Transfers: Comptroller transfers: Min assist   General transfer comment: Min assistance to scoot posterior into recliner.  Pt required assistance/encouragement to complete AP transfer.  Pt required increased time to complete with noticeable fatigue.  Ambulation/Gait                 Stairs             Wheelchair Mobility    Modified Rankin (Stroke  Patients Only)       Balance                                            Cognition Arousal/Alertness: Awake/alert Behavior During Therapy: WFL for tasks assessed/performed Overall Cognitive Status: Within Functional Limits for tasks assessed                                 General Comments: Pt continues to follow commands well with encouragement.      Exercises Amputee Exercises Hip Extension: AAROM;Both;Sidelying;10 reps Hip ABduction/ADduction: AROM;Both;10 reps;Supine Hip Flexion/Marching: AROM;Both;10 reps;Supine    General Comments        Pertinent Vitals/Pain Pain Assessment: Faces Faces Pain Scale: Hurts a little bit Pain Location: R hip Pain Descriptors / Indicators: Grimacing;Guarding Pain Intervention(s): Monitored during session;Repositioned    Home Living                      Prior Function            PT Goals (current goals can now be found in the care plan section) Acute Rehab PT Goals Patient Stated Goal: To go home. Potential to Achieve Goals: Good Progress towards PT goals: Progressing toward goals  Frequency    Min 3X/week      PT Plan Current plan remains appropriate    Co-evaluation              AM-PAC PT "6 Clicks" Mobility   Outcome Measure  Help needed turning from your back to your side while in a flat bed without using bedrails?: A Lot Help needed moving from lying on your back to sitting on the side of a flat bed without using bedrails?: A Little Help needed moving to and from a bed to a chair (including a wheelchair)?: A Little Help needed standing up from a chair using your arms (e.g., wheelchair or bedside chair)?: Total Help needed to walk in hospital room?: Total Help needed climbing 3-5 steps with a railing? : Total 6 Click Score: 11    End of Session Equipment Utilized During Treatment: Gait belt Activity Tolerance: Patient tolerated treatment well Patient left: in  chair;with call bell/phone within reach;with chair alarm set Nurse Communication: Mobility status PT Visit Diagnosis: Unsteadiness on feet (R26.81)     Time: 0375-4360 PT Time Calculation (min) (ACUTE ONLY): 21 min  Charges:  $Therapeutic Activity: 8-22 mins                     Erasmo Leventhal , PTA Acute Rehabilitation Services Pager [phone] Office [phone]    Herman Mell Eli Hose 12/27/2020, 11:30 AM

## 2020-12-27 NOTE — Discharge Summary (Signed)
Physician Discharge Summary  [name] TFT:[id number] DOB: 02/08/1951 DOA: 12/22/2020  PCP: Prince Solian, MD  Admit date: 12/22/2020 Discharge date: 12/27/2020  Admitted From: Home Disposition: Home  Recommendations for Outpatient Follow-up:  Follow up with PCP in 1 week Follow up with Dr. Marcelino Scot in 2 weeks Please obtain BMP/CBC in one week Please follow up on the following pending results: None  Home Health: PT/OT Equipment/Devices: None  Discharge Condition: Stable CODE STATUS: Full code Diet recommendation: Renal diet   Brief/Interim Summary:  Admission HPI written by Eben Burow, MD   HPI: [name] is a 70 y.o. female with medical history significant for ESRD on HD Monday, Wednesday and Fridays, DMT2, HTN, CHF, HLD, blindness due to glaucoma, bilateral AKA status. She presents after a fall at home.  She reports she was sitting in her wheelchair at the kitchen table when she fell asleep.  She thought she had the wheelchair locked but it was not as it started to roll back and she fell out of the wheelchair landing on her right side.  She had instantaneous severe pain in her right leg and she was not able to get up she tried to scoot across the floor and hit her left elbow on the ground a few times causing a small cut.  She did not have any head trauma and had no loss of consciousness.  She denies any abdominal pain, nausea, vomiting, diarrhea.  She did not have any chest pain, shortness of breath, palpitations, fever or chills. She went to dialysis on Friday but unfortunately the dialysis center had a water line break and she was unable to complete her dialysis session.   Hospital course:  Closed right hip fracture Secondary to fall. No known syncopal episode. Orthopedic surgery consulted on admission and patient underwent intramedullary nail placement on 12/23/2020. Orthopedic surgery recommendation for ROM as tolerated, Eliquis 2.5 mg for VTE prophylaxis.  PT/OT recommending home health services on discharge.  Diabetes mellitus, type 2 Hemoglobin A1C of 5.5%. Continue home Novolog regimen but likely this can be discontinued at some point.  ESRD on HD HD while inpatient. Follow up with outpatient HD regimen, MWF.  Chronic combined systolic and diastolic heart failure Stable. No exacerbation. Fluid   Anemia of chronic disease Stable  History of bilateral AKA Discharge with resumption of home health resumption.  Bilateral glaucoma and blindness Chronic. Noted. Continue eye drops.  Discharge Diagnoses:  Principal Problem:   Closed right hip fracture ([place]) Active Problems:   Type II diabetes mellitus with renal manifestations (HCC)   ESRD on dialysis ([place])   Chronic combined systolic and diastolic CHF (congestive heart failure) (HCC)   Anemia in ESRD (end-stage renal disease) (HCC)   S/P AKA (above knee amputation) bilateral [facility])    Discharge Instructions  Discharge Instructions     Increase activity slowly   Complete by: As directed    No wound care   Complete by: As directed       Allergies as of 12/27/2020       Reactions   Tape Itching, Rash   20M Transpore adhesive tape. Medical tape pulls off the skin!! PAPER TAPE ONLY, PLEASE   Latex Hives   Oxycodone Other (See Comments)   Hallucinations    Tramadol Other (See Comments)   Hallucinations with a full tablet   Aspirin Other (See Comments)   Trouble with stomach bleeding   Morphine And Related Other (See Comments)   Hallucinations  Vicodin [hydrocodone-acetaminophen] Other (See Comments)   Hallucinations        Medication List     STOP taking these medications    clopidogrel 75 MG tablet Commonly known as: PLAVIX   traMADol 50 MG tablet Commonly known as: ULTRAM       TAKE these medications    acetaminophen 500 MG tablet Commonly known as: TYLENOL Take 2 tablets (1,000 mg total) by mouth every 8 (eight) hours for 14 days. What changed:   how much to take when to take this reasons to take this   albuterol 108 (90 Base) MCG/ACT inhaler Commonly known as: VENTOLIN HFA Inhale 2 puffs into the lungs every 6 (six) hours as needed for wheezing or shortness of breath.   apixaban 2.5 MG Tabs tablet Commonly known as: ELIQUIS Take 1 tablet (2.5 mg total) by mouth 2 (two) times daily.   atropine 1 % ophthalmic solution Place 1 drop into the right eye 2 (two) times daily as needed (dry eyes).   BD Pen Needle Nano 2nd Gen 32G X 4 MM Misc Generic drug: Insulin Pen Needle in the morning, at noon, and at bedtime.   darbepoetin 200 MCG/0.4ML Soln injection Commonly known as: ARANESP Inject 0.4 mLs (200 mcg total) into the vein every Wednesday with hemodialysis.   diclofenac sodium 1 % Gel Commonly known as: VOLTAREN Apply 2 g topically 4 (four) times daily as needed (pain).   diphenhydrAMINE 25 MG tablet Commonly known as: BENADRYL Take 25 mg by mouth See admin instructions. Take one tablet (25 mg) by mouth daily at bedtime, and take one tablet (25 mg) by mouth on Monday, Wednesday, Friday mornings before dialysis   docusate sodium 100 MG capsule Commonly known as: COLACE Take 1 capsule (100 mg total) by mouth 2 (two) times daily. What changed:  medication strength how much to take when to take this   doxercalciferol 4 MCG/2ML injection Commonly known as: HECTOROL Inject 0.5 mLs (1 mcg total) into the vein every Monday, Wednesday, and Friday with hemodialysis.   fluticasone 50 MCG/ACT nasal spray Commonly known as: FLONASE Place 1 spray into both nostrils daily as needed for allergies or rhinitis (congestion).   gabapentin 100 MG capsule Commonly known as: NEURONTIN Take 100 mg by mouth in the morning.   Hydrocortisone Acetate 1 % Oint Apply 1 application topically as needed (inflammation).   HYDROmorphone 2 MG tablet Commonly known as: DILAUDID Take 1 tablet (2 mg total) by mouth every 6 (six) hours as  needed for moderate pain or severe pain.   insulin aspart 100 UNIT/ML FlexPen Commonly known as: NOVOLOG Inject 6-8 Units into the skin 2 (two) times daily with a meal. 6 units in the evening, 8 units in the morning   lanthanum 1000 MG chewable tablet Commonly known as: FOSRENOL Chew 2,000-4,000 mg by mouth See admin instructions. Chew 4 tablets (4000 mg) by mouth with each meal and 2 tablets (2000 mg) with snacks   multivitamin Tabs tablet Take 1 tablet by mouth daily.   nystatin ointment Commonly known as: MYCOSTATIN Apply topically 2 (two) times daily.   omeprazole 40 MG capsule Commonly known as: PRILOSEC Take 1 capsule (40 mg total) by mouth daily.   OneTouch Verio test strip Generic drug: glucose blood 1 each by Other route 3 (three) times daily.   OXYGEN Inhale 3 L into the lungs continuous.   prednisoLONE acetate 1 % ophthalmic suspension Commonly known as: PRED FORTE Place 1 drop into the right  eye daily. For eye pain   traZODone 100 MG tablet Commonly known as: DESYREL Take 100 mg by mouth at bedtime.        Follow-up Information     Altamese Kittitas, MD Follow up in 2 week(s).   Specialty: Orthopedic Surgery Contact information: [place] [address] [phone]         Prince Solian, MD. Schedule an appointment as soon as possible for a visit in 1 week(s).   Specialty: Internal Medicine Why: For hospital follow-up Contact information: [place] [address] [phone]                Allergies  Allergen Reactions   Tape Itching and Rash    30M Transpore adhesive tape. Medical tape pulls off the skin!! PAPER TAPE ONLY, PLEASE   Latex Hives   Oxycodone Other (See Comments)    Hallucinations    Tramadol Other (See Comments)    Hallucinations with a full tablet   Aspirin Other (See Comments)    Trouble with stomach bleeding   Morphine And Related Other (See Comments)    Hallucinations     Vicodin [Hydrocodone-Acetaminophen] Other (See Comments)    Hallucinations     Consultations: Orthopedic surgery   Procedures/Studies: DG Pelvis 1-2 Views  Result Date: 12/22/2020 CLINICAL DATA:  Fall, RIGHT hip pain. EXAM: PELVIS - 1-2 VIEW COMPARISON:  None. FINDINGS: Displaced fracture of the RIGHT femoral neck. Visualized portions of the osseous pelvis appear intact and normally aligned. Sacrum in the at bones partially obscured by overlying oral contrast material. Extensive vascular calcifications throughout the pelvis and upper thigh regions. IMPRESSION: Displaced fracture of the RIGHT femoral neck. Electronically Signed   By: Franki Cabot M.D.   On: 12/22/2020 22:40   DG Elbow 2 Views Left  Result Date: 12/22/2020 CLINICAL DATA:  Fall, RIGHT hip pain EXAM: LEFT ELBOW - 2 VIEW COMPARISON:  None FINDINGS: Osseous structures about the elbow appear intact and normally aligned. No fracture line or displaced fracture fragment. No appreciable joint effusion. Soft tissue swelling/edema posterior to the olecranon. Extensive vascular calcifications about the elbow. IMPRESSION: Soft tissue swelling/edema posterior to the olecranon. No osseous fracture or dislocation. Electronically Signed   By: Franki Cabot M.D.   On: 12/22/2020 22:41   DG Abd 1 View  Result Date: 11/29/2020 CLINICAL DATA:  Chronic abdominal pain, retained barium EXAM: ABDOMEN - 1 VIEW COMPARISON:  08/13/2020, 10/27/2016 FINDINGS: Supine frontal view of the abdomen and pelvis excludes the hemidiaphragms, lower pelvis and left flank by collimation. There is retained barium throughout the distal small bowel and colon. No bowel obstruction or ileus. No abdominal masses. IMPRESSION: 1. Retained barium throughout the small and large bowel. No obstruction or ileus. Electronically Signed   By: Randa Ngo M.D.   On: 11/29/2020 08:09   DG C-Arm 1-60 Min  Result Date: 12/23/2020 CLINICAL DATA:  Intraoperative images of right femoral  nail placement. EXAM: DG C-ARM 1-60 MIN CONTRAST:  None FLUOROSCOPY TIME:  Fluoroscopy Time:  54 seconds Radiation Exposure Index (if provided by the fluoroscopic device): Not reported Number of Acquired Spot Images: 8 COMPARISON:  None. FINDINGS: Intraoperative fluoroscopy was obtained during placement of a intramedullary nail within the right femur. IMPRESSION: Intraoperative fluoroscopy during placement of antegrade intramedullary nail with right femur. Electronically Signed   By: Ulyses Jarred M.D.   On: 12/23/2020 20:52   DG Hip Port Unilat With Pelvis 1V Right  Result Date: 12/23/2020 CLINICAL  DATA:  Fracture EXAM: DG HIP (WITH OR WITHOUT PELVIS) 1V PORT RIGHT COMPARISON:  None. FINDINGS: Antegrade intramedullary nail with interlocking screw of the proximal right femur. Small amount of soft tissue gas. Atherosclerotic calcification. IMPRESSION: Internal fixation of proximal right femoral fracture without adverse features. Electronically Signed   By: Ulyses Jarred M.D.   On: 12/23/2020 20:49   DG FEMUR, MIN 2 VIEWS RIGHT  Result Date: 12/23/2020 CLINICAL DATA:  Intramedullary nail of the right femur EXAM: RIGHT FEMUR 2 VIEWS COMPARISON:  None. FINDINGS: Eight fluoroscopic images were obtained during placement of a short intramedullary nail in the right femur. IMPRESSION: Successful placement of short intramedullary nail in the right femur. Electronically Signed   By: Ulyses Jarred M.D.   On: 12/23/2020 20:50   DG Femur Min 2 Views Right  Result Date: 12/22/2020 CLINICAL DATA:  Fall, RIGHT hip pain. EXAM: RIGHT FEMUR 2 VIEWS COMPARISON:  None. FINDINGS: Displaced fracture within the RIGHT femoral neck, intratrochanteric with involvement of the greater trochanter. Associated angulation deformity at the fracture site, nearly 90 degrees. Femoral head remains grossly well positioned relative to the acetabulum. Surgical amputation deformity of the mid RIGHT femur. Extensive vascular calcifications of the  RIGHT upper thigh. IMPRESSION: Displaced fracture within the intertrochanteric RIGHT femoral neck with prominent angulation deformity at the fracture site. Electronically Signed   By: Franki Cabot M.D.   On: 12/22/2020 22:38      Subjective: No concerns this morning. Eager to discharge home.  Discharge Exam: Vitals:   12/26/20 2117 12/27/20 0932  BP: (!) 128/45 95/66  Pulse: 78 92  Resp: 18 16  Temp: 97.9 F (36.6 C) 98.3 F (36.8 C)  SpO2: 97% 93%   Vitals:   12/26/20 1200 12/26/20 1240 12/26/20 2117 12/27/20 0932  BP: (!) 143/52 (!) 127/50 (!) 128/45 95/66  Pulse: 78 87 78 92  Resp: 14 15 18 16   Temp: 97.8 F (36.6 C)  97.9 F (36.6 C) 98.3 F (36.8 C)  TempSrc: Oral  Oral Oral  SpO2: 100% 100% 97% 93%  Weight: 70 kg     Height:        General: Pt is alert, awake, not in acute distres Cardiovascular: RRR, S1/S2 +, no rubs, no gallops. 2/6 systolic murmur Respiratory: CTA bilaterally, no wheezing, no rhonchi Abdominal: Soft, NT, ND, bowel sounds + Extremities: no edema, no cyanosis. Bilateral AKA    The results of significant diagnostics from this hospitalization (including imaging, microbiology, ancillary and laboratory) are listed below for reference.     Microbiology: Recent Results (from the past 240 hour(s))  Resp Panel by RT-PCR (Flu A&B, Covid) Nasopharyngeal Swab     Status: None   Collection Time: 12/22/20 11:00 PM   Specimen: Nasopharyngeal Swab; Nasopharyngeal(NP) swabs in vial transport medium  Result Value Ref Range Status   SARS Coronavirus 2 by RT PCR NEGATIVE NEGATIVE Final    Comment: (NOTE) SARS-CoV-2 target nucleic acids are NOT DETECTED.  The SARS-CoV-2 RNA is generally detectable in upper respiratory specimens during the acute phase of infection. The lowest concentration of SARS-CoV-2 viral copies this assay can detect is 138 copies/mL. A negative result does not preclude SARS-Cov-2 infection and should not be used as the sole basis for  treatment or other patient management decisions. A negative result may occur with  improper specimen collection/handling, submission of specimen other than nasopharyngeal swab, presence of viral mutation(s) within the areas targeted by this assay, and inadequate number of viral copies(<138 copies/mL). A  negative result must be combined with clinical observations, patient history, and epidemiological information. The expected result is Negative.  Fact Sheet for Patients:  EntrepreneurPulse.com.au  Fact Sheet for Healthcare Providers:  IncredibleEmployment.be  This test is no t yet approved or cleared by the [place] FDA and  has been authorized for detection and/or diagnosis of SARS-CoV-2 by FDA under an Emergency Use Authorization (EUA). This EUA will remain  in effect (meaning this test can be used) for the duration of the COVID-19 declaration under Section 564(b)(1) of the Act, 21 U.S.C.section 360bbb-3(b)(1), unless the authorization is terminated  or revoked sooner.       Influenza A by PCR NEGATIVE NEGATIVE Final   Influenza B by PCR NEGATIVE NEGATIVE Final    Comment: (NOTE) The Xpert Xpress SARS-CoV-2/FLU/RSV plus assay is intended as an aid in the diagnosis of influenza from Nasopharyngeal swab specimens and should not be used as a sole basis for treatment. Nasal washings and aspirates are unacceptable for Xpert Xpress SARS-CoV-2/FLU/RSV testing.  Fact Sheet for Patients: EntrepreneurPulse.com.au  Fact Sheet for Healthcare Providers: IncredibleEmployment.be  This test is not yet approved or cleared by the [place] FDA and has been authorized for detection and/or diagnosis of SARS-CoV-2 by FDA under an Emergency Use Authorization (EUA). This EUA will remain in effect (meaning this test can be used) for the duration of the COVID-19 declaration under Section 564(b)(1) of the Act, 21  U.S.C. section 360bbb-3(b)(1), unless the authorization is terminated or revoked.  Performed at [facility], [place] [address] [place], [place] [address]   Surgical pcr screen     Status: Abnormal   Collection Time: 12/23/20  3:05 PM   Specimen: Nasal Mucosa; Nasal Swab  Result Value Ref Range Status   MRSA, PCR POSITIVE (A) NEGATIVE Final    Comment: RESULT CALLED TO, READ BACK BY AND VERIFIED WITH: RN Chari Manning ON [id number] AT [id number] BY E.PARRISH    Staphylococcus aureus POSITIVE (A) NEGATIVE Final    Comment: (NOTE) The Xpert SA Assay (FDA approved for NASAL specimens in patients 46 years of age and older), is one component of a comprehensive surveillance program. It is not intended to diagnose infection nor to guide or monitor treatment. Performed at [facility], [place] [address], [place], [place] [address]      Labs: BNP (last 3 results) No results for input(s): BNP in the last 8760 hours. Basic Metabolic Panel: Recent Labs  Lab 12/23/20 1508 12/24/20 0329 12/25/20 0302 12/26/20 0324 12/27/20 0143  NA 139 137 136 137 134*  K 4.9 5.0 4.0 3.9 3.3*  CL 98 93* 93* 95* 95*  CO2 25 22 31 28 23   GLUCOSE 83 239* 151* 84 146*  BUN 28* 34* 21 29* 13  CREATININE 9.46* 10.36* 6.17* 7.57* 4.67*  CALCIUM 9.0 8.8* 8.2* 8.4* 8.6*  PHOS  --  5.9*  --   --   --    Liver Function Tests: No results for input(s): AST, ALT, ALKPHOS, BILITOT, PROT, ALBUMIN in the last 168 hours. No results for input(s): LIPASE, AMYLASE in the last 168 hours. No results for input(s): AMMONIA in the last 168 hours. CBC: Recent Labs  Lab 12/22/20 2300 12/24/20 0329 12/25/20 0302 12/26/20 0324 12/27/20 0143  WBC 9.8 5.2 6.5 5.2 5.7  NEUTROABS 7.3  --   --   --   --   HGB 9.7* 8.8* 9.0* 8.9* 9.0*  HCT 30.8* 27.8* 28.1* 28.2* 27.9*  MCV 99.4  99.6 97.9 98.9 96.9  PLT 235 204 202 184 191   Cardiac Enzymes: No results for input(s): CKTOTAL, CKMB, CKMBINDEX, TROPONINI in the last 168  hours. BNP: Invalid input(s): POCBNP CBG: Recent Labs  Lab 12/26/20 1241 12/26/20 1319 12/26/20 1739 12/26/20 2110 12/27/20 0744  GLUCAP 67* 108* 126* 183* 122*   D-Dimer No results for input(s): DDIMER in the last 72 hours. Hgb A1c Recent Labs    12/25/20 0302  HGBA1C 5.5   Lipid Profile No results for input(s): CHOL, HDL, LDLCALC, TRIG, CHOLHDL, LDLDIRECT in the last 72 hours. Thyroid function studies No results for input(s): TSH, T4TOTAL, T3FREE, THYROIDAB in the last 72 hours.  Invalid input(s): FREET3 Anemia work up No results for input(s): VITAMINB12, FOLATE, FERRITIN, TIBC, IRON, RETICCTPCT in the last 72 hours. Urinalysis    Component Value Date/Time   COLORURINE YELLOW 02/06/2018 1709   APPEARANCEUR HAZY (A) 02/06/2018 1709   LABSPEC 1.013 02/06/2018 1709   LABSPEC 1.025 02/13/2011 1533   PHURINE 8.0 02/06/2018 1709   GLUCOSEU 50 (A) 02/06/2018 1709   HGBUR NEGATIVE 02/06/2018 1709   BILIRUBINUR NEGATIVE 02/06/2018 1709   BILIRUBINUR Negative 02/13/2011 1533   KETONESUR NEGATIVE 02/06/2018 1709   PROTEINUR 100 (A) 02/06/2018 1709   UROBILINOGEN 0.2 04/28/2011 1045   NITRITE NEGATIVE 02/06/2018 1709   LEUKOCYTESUR NEGATIVE 02/06/2018 1709   LEUKOCYTESUR Negative 02/13/2011 1533   Sepsis Labs Invalid input(s): PROCALCITONIN,  WBC,  LACTICIDVEN Microbiology Recent Results (from the past 240 hour(s))  Resp Panel by RT-PCR (Flu A&B, Covid) Nasopharyngeal Swab     Status: None   Collection Time: 12/22/20 11:00 PM   Specimen: Nasopharyngeal Swab; Nasopharyngeal(NP) swabs in vial transport medium  Result Value Ref Range Status   SARS Coronavirus 2 by RT PCR NEGATIVE NEGATIVE Final    Comment: (NOTE) SARS-CoV-2 target nucleic acids are NOT DETECTED.  The SARS-CoV-2 RNA is generally detectable in upper respiratory specimens during the acute phase of infection. The lowest concentration of SARS-CoV-2 viral copies this assay can detect is 138 copies/mL. A  negative result does not preclude SARS-Cov-2 infection and should not be used as the sole basis for treatment or other patient management decisions. A negative result may occur with  improper specimen collection/handling, submission of specimen other than nasopharyngeal swab, presence of viral mutation(s) within the areas targeted by this assay, and inadequate number of viral copies(<138 copies/mL). A negative result must be combined with clinical observations, patient history, and epidemiological information. The expected result is Negative.  Fact Sheet for Patients:  EntrepreneurPulse.com.au  Fact Sheet for Healthcare Providers:  IncredibleEmployment.be  This test is no t yet approved or cleared by the [place] FDA and  has been authorized for detection and/or diagnosis of SARS-CoV-2 by FDA under an Emergency Use Authorization (EUA). This EUA will remain  in effect (meaning this test can be used) for the duration of the COVID-19 declaration under Section 564(b)(1) of the Act, 21 U.S.C.section 360bbb-3(b)(1), unless the authorization is terminated  or revoked sooner.       Influenza A by PCR NEGATIVE NEGATIVE Final   Influenza B by PCR NEGATIVE NEGATIVE Final    Comment: (NOTE) The Xpert Xpress SARS-CoV-2/FLU/RSV plus assay is intended as an aid in the diagnosis of influenza from Nasopharyngeal swab specimens and should not be used as a sole basis for treatment. Nasal washings and aspirates are unacceptable for Xpert Xpress SARS-CoV-2/FLU/RSV testing.  Fact Sheet for Patients: EntrepreneurPulse.com.au  Fact Sheet for Healthcare Providers: IncredibleEmployment.be  This  test is not yet approved or cleared by the [place] and has been authorized for detection and/or diagnosis of SARS-CoV-2 by FDA under an Emergency Use Authorization (EUA). This EUA will remain in effect (meaning this test can  be used) for the duration of the COVID-19 declaration under Section 564(b)(1) of the Act, 21 U.S.C. section 360bbb-3(b)(1), unless the authorization is terminated or revoked.  Performed at [facility], [place] [address], [place], [place] [address]   Surgical pcr screen     Status: Abnormal   Collection Time: 12/23/20  3:05 PM   Specimen: Nasal Mucosa; Nasal Swab  Result Value Ref Range Status   MRSA, PCR POSITIVE (A) NEGATIVE Final    Comment: RESULT CALLED TO, READ BACK BY AND VERIFIED WITH: RN Chari Manning ON [id number] AT [id number] BY E.PARRISH    Staphylococcus aureus POSITIVE (A) NEGATIVE Final    Comment: (NOTE) The Xpert SA Assay (FDA approved for NASAL specimens in patients 55 years of age and older), is one component of a comprehensive surveillance program. It is not intended to diagnose infection nor to guide or monitor treatment. Performed at [facility], [place] [address], [place], [place] [address]      Time coordinating discharge: 35 minutes  SIGNED:   Cordelia Poche, MD [facility] 12/27/2020, 11:28 AM

## 2020-12-27 NOTE — Progress Notes (Signed)
Orthopaedic Trauma Service Progress Note  Patient ID: [name] MRN: [id number] DOB/AGE: [birth year] 70 y.o.  Subjective:  Ortho issues stable   ROS As above  Objective:   VITALS:   Vitals:   12/26/20 1200 12/26/20 1240 12/26/20 2117 12/27/20 0932  BP: (!) 143/52 (!) 127/50 (!) 128/45 95/66  Pulse: 78 87 78 92  Resp: 14 15 18 16   Temp: 97.8 F (36.6 C)  97.9 F (36.6 C) 98.3 F (36.8 C)  TempSrc: Oral  Oral Oral  SpO2: 100% 100% 97% 93%  Weight: 70 kg     Height:        Estimated body mass index is 25.68 kg/m as calculated from the following:   Height as of this encounter: 5\' 5"  (1.651 m).   Weight as of this encounter: 70 kg.   Intake/Output      06/08 0701 06/09 0700 06/09 0701 06/10 0700   P.O. 120    Total Intake(mL/kg) 120 (1.7)    Other 2000    Total Output 2000    Net -1880           LABS  Results for orders placed or performed during the hospital encounter of 12/22/20 (from the past 24 hour(s))  Glucose, capillary     Status: Abnormal   Collection Time: 12/26/20 12:41 PM  Result Value Ref Range   Glucose-Capillary 67 (L) 70 - 99 mg/dL  Glucose, capillary     Status: Abnormal   Collection Time: 12/26/20  1:19 PM  Result Value Ref Range   Glucose-Capillary 108 (H) 70 - 99 mg/dL  Glucose, capillary     Status: Abnormal   Collection Time: 12/26/20  5:39 PM  Result Value Ref Range   Glucose-Capillary 126 (H) 70 - 99 mg/dL  Glucose, capillary     Status: Abnormal   Collection Time: 12/26/20  9:10 PM  Result Value Ref Range   Glucose-Capillary 183 (H) 70 - 99 mg/dL  CBC     Status: Abnormal   Collection Time: 12/27/20  1:43 AM  Result Value Ref Range   WBC 5.7 4.0 - 10.5 K/uL   RBC 2.88 (L) 3.87 - 5.11 MIL/uL   Hemoglobin 9.0 (L) 12.0 - 15.0 g/dL   HCT 27.9 (L) 36.0 - 46.0 %   MCV 96.9 80.0 - 100.0 fL   MCH 31.3 26.0 - 34.0 pg   MCHC 32.3 30.0 - 36.0 g/dL    RDW 19.7 (H) 11.5 - 15.5 %   Platelets 191 150 - 400 K/uL   nRBC 0.0 0.0 - 0.2 %  Basic metabolic panel     Status: Abnormal   Collection Time: 12/27/20  1:43 AM  Result Value Ref Range   Sodium 134 (L) 135 - 145 mmol/L   Potassium 3.3 (L) 3.5 - 5.1 mmol/L   Chloride 95 (L) 98 - 111 mmol/L   CO2 23 22 - 32 mmol/L   Glucose, Bld 146 (H) 70 - 99 mg/dL   BUN 13 8 - 23 mg/dL   Creatinine, Ser 4.67 (H) 0.44 - 1.00 mg/dL   Calcium 8.6 (L) 8.9 - 10.3 mg/dL   GFR, Estimated 10 (L) >60 mL/min   Anion gap 16 (H) 5 - 15  Glucose, capillary     Status: Abnormal   Collection Time: 12/27/20  7:44 AM  Result Value Ref Range   Glucose-Capillary 122 (H) 70 - 99 mg/dL     PHYSICAL EXAM:   Gen: NAD Ext:       Right Lower Extremity             Dressings stable  Incisions stable, no signs of infection              Stump in good condition    Assessment/Plan: 4 Days Post-Op   Principal Problem:   Closed right hip fracture (HCC) Active Problems:   Type II diabetes mellitus with renal manifestations (HCC)   ESRD on dialysis (HCC)   Chronic combined systolic and diastolic CHF (congestive heart failure) (HCC)   Anemia in ESRD (end-stage renal disease) (HCC)   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[place])   Anti-infectives (From admission, onward)    Start     Dose/Rate Route Frequency Ordered Stop   12/23/20 2200  ceFAZolin (ANCEF) IVPB 2g/100 mL premix  Status:  Discontinued        2 g 200 mL/hr over 30 Minutes Intravenous Every 6 hours 12/23/20 2103 12/23/20 2135   12/23/20 1658  ceFAZolin (ANCEF) 2-4 GM/100ML-% IVPB       Note to Pharmacy: Nyoka Cowden   : cabinet override      12/23/20 1658 12/24/20 0459     .  POD/HD#: 64   70 y/o female with R hip fracture, B AKAs   -fall   - R hip fracture s/p IMN             ROM as tolerated             No restrictions with R leg             Dressing changes as needed   Clean surgical sites with soap and water only    -  Dispo:             Ortho issues stable             Continue per medicine service                        Given her overall clinical picture, think plavix will suffice for VTE prophylaxis   Follow up with ortho in 10-14 days  Rxs for pain meds sent to listed pharmacy     Jari Pigg, PA-C [phone] (C) 12/27/2020, 10:14 AM  [facility] [place] [address] [phone] Jenetta [phone] (F)    After 5pm and on the weekends please log on to Amion, go to orthopaedics and the look under the [facility] Call for the provider(s) on call. You can also call our office at [phone] and then follow the prompts to be connected to the call team.

## 2020-12-27 NOTE — Care Management Important Message (Signed)
Important Message  Patient Details  Name: [name] MRN: [id number] Date of Birth: 23-Aug-1950   Medicare Important Message Given:  Yes     Orbie Pyo 12/27/2020, 3:08 PM

## 2020-12-27 NOTE — Progress Notes (Signed)
Pt refused vitals at this time. She states she just wants for her sister to come and visit her.

## 2020-12-28 ENCOUNTER — Telehealth: Payer: Self-pay | Admitting: Nephrology

## 2020-12-28 NOTE — Op Note (Signed)
12/23/2020  5:02 PM  PATIENT:  [name]  70 y.o. female  PRE-OPERATIVE DIAGNOSIS:  Right Basocervical Hip Fx  POST-OPERATIVE DIAGNOSIS:  Right Basocervical Hip Fx   PROCEDURE:   INTRAMEDULLARY NAILING OF THE RIGHT HIP using a SHORT [employer] Affixus nail. INSERTION AND REMOVAL OF FEMORAL TRACTION PIN RIGHT  SURGEON:  Astrid Divine. Marcelino Scot, M.D.  ASSISTANT:  Ainsley Spinner, PA-C.  ANESTHESIA:  General.  COMPLICATIONS:  None.  ESTIMATED BLOOD LOSS:  Less than 150 mL.  DISPOSITION:  To PACU.  CONDITION:  Stable.  DELAY START OF DVT PROPHYLAXIS BECAUSE OF BLEEDING RISK: NO  BRIEF SUMMARY AND INDICATION OF PROCEDURE:  [name] is a 70 y.o. year- old with multiple medical problems, including bilateral AKA's.  I discussed with the patient and family risks and benefits of surgical treatment including the potential for malunion, nonunion, symptomatic hardware, heart attack, stroke, neurovascular injury, bleeding, and others.  After full discussion, the patient and family wished to proceed.  BRIEF SUMMARY OF PROCEDURE:  The patient was taken to the operating room where general anesthesia was induced.  She was positioned supine on the radiolucent fracture table.  A thorough scrub and wash with chlorhexidine and then Betadine scrub and paint was performed.  After sterile drapes and time-out, C-arm was brought in to visual the distal femoral shaft for identifying the best place for traction pin insertion. A 2 mm pin was driven across and secured with the tension traction bow. A closed reduction maneuver was performed of the fractured proximal femur by my assistant and this was confirmed on both AP and lateral xray views. A long instrument was used to identify the appropriate starting position under C-arm on both AP and lateral images.  A 3 cm incision was made proximal to the greater trochanter.  The curved cannulated awl was inserted just medial to the tip of the lateral trochanter and  then the starting guidewire advanced into the proximal femur.  This was checked on AP and lateral views.  The starting reamer was engaged with the soft tissue protected by a sleeve.  The curved ball-tipped guidewire was then inserted, making sure it was just posterior as possible in the distal femur and across the fracture site, which stayed in a reduced position.  It was sequentially reamed up to 12.5 and an 11 x 180 mm nail inserted to the appropriate depth.  The guidewire for the lag screw was then inserted with the appropriate anteversion to make sure it was in a center-center position.  This was measured and the lag screw placed with excellent purchase and position checked on both views.  The antirotation screw was then engaged within the groove of the lag screw, which was allowed to telescope.  Traction was released and compression achieved with the screw.  This was followed by placement of one distal locking screw using the guide.  This was confirmed on AP and lateral images. The traction pin was then withdrawn. Wounds were irrigated thoroughly, closed in a standard layered fashion. Sterile gently compressive dressings were applied.  Ainsley Spinner, PA-C, assisted throughout.  The patient was awakened from anesthesia and transported to the PACU in stable condition.  PROGNOSIS:  The patient will be weightbearing as tolerated with physical therapy beginning DVT prophylaxis as soon as deemed stable by the Primary Care Service.  She has no range of motion precautions.  We will continue to follow through at the hospital.  Anticipate follow up in the office in 2  weeks for removal of sutures and further evaluation.     Astrid Divine. Marcelino Scot, M.D.

## 2020-12-28 NOTE — Telephone Encounter (Signed)
Transition of Care Contact from [place]   Date of Discharge: 12/27/20 Date of Contact: 12/28/20 Method of contact: phone Talked to patient   Patient contacted to discuss transition of care form recent hospitaliztion. Patient was admitted to [facility] from 12/22/20 to 12/27/20 with the discharge diagnosis of R hip fracture s/p ORIF.     Medication changes were reviewed - plavix and tramadol stopped.  Patient will follow up with is outpatient dialysis center 12/31/20.  Missed dialysis today due not feeling up to going.  Stressed importance of compliance with dialysis.  Advised strict fluid restrictions and low K diet over weekend.   Other follow up needs include none identified.    Jen Mow, PA-C [facility] Pager: [phone]

## 2020-12-31 ENCOUNTER — Other Ambulatory Visit: Payer: Self-pay | Admitting: *Deleted

## 2020-12-31 DIAGNOSIS — S72001A Fracture of unspecified part of neck of right femur, initial encounter for closed fracture: Secondary | ICD-10-CM | POA: Diagnosis not present

## 2020-12-31 DIAGNOSIS — Z992 Dependence on renal dialysis: Secondary | ICD-10-CM | POA: Diagnosis not present

## 2020-12-31 DIAGNOSIS — N2581 Secondary hyperparathyroidism of renal origin: Secondary | ICD-10-CM | POA: Diagnosis not present

## 2020-12-31 DIAGNOSIS — I120 Hypertensive chronic kidney disease with stage 5 chronic kidney disease or end stage renal disease: Secondary | ICD-10-CM | POA: Diagnosis not present

## 2020-12-31 DIAGNOSIS — N186 End stage renal disease: Secondary | ICD-10-CM | POA: Diagnosis not present

## 2020-12-31 DIAGNOSIS — D631 Anemia in chronic kidney disease: Secondary | ICD-10-CM | POA: Diagnosis not present

## 2020-12-31 DIAGNOSIS — D509 Iron deficiency anemia, unspecified: Secondary | ICD-10-CM | POA: Diagnosis not present

## 2020-12-31 NOTE — Patient Outreach (Signed)
[facility]) Care Management  12/31/2020  [name] 12/02/1950 [id number]   Telephone Assessment-Unsuccessful  RN attempted the initial outreach however unsuccessful. RN able to leave a HIPAA approved voice message requesting a call back.  Will attempted another outreach over the next week for pending services and sent outreach letter.  [name], RN Care Management Coordinator [place] Office [phone]

## 2021-01-02 DIAGNOSIS — N2581 Secondary hyperparathyroidism of renal origin: Secondary | ICD-10-CM | POA: Diagnosis not present

## 2021-01-02 DIAGNOSIS — D509 Iron deficiency anemia, unspecified: Secondary | ICD-10-CM | POA: Diagnosis not present

## 2021-01-02 DIAGNOSIS — D631 Anemia in chronic kidney disease: Secondary | ICD-10-CM | POA: Diagnosis not present

## 2021-01-02 DIAGNOSIS — N186 End stage renal disease: Secondary | ICD-10-CM | POA: Diagnosis not present

## 2021-01-02 DIAGNOSIS — Z992 Dependence on renal dialysis: Secondary | ICD-10-CM | POA: Diagnosis not present

## 2021-01-04 DIAGNOSIS — D509 Iron deficiency anemia, unspecified: Secondary | ICD-10-CM | POA: Diagnosis not present

## 2021-01-04 DIAGNOSIS — N186 End stage renal disease: Secondary | ICD-10-CM | POA: Diagnosis not present

## 2021-01-04 DIAGNOSIS — Z992 Dependence on renal dialysis: Secondary | ICD-10-CM | POA: Diagnosis not present

## 2021-01-04 DIAGNOSIS — N2581 Secondary hyperparathyroidism of renal origin: Secondary | ICD-10-CM | POA: Diagnosis not present

## 2021-01-04 DIAGNOSIS — D631 Anemia in chronic kidney disease: Secondary | ICD-10-CM | POA: Diagnosis not present

## 2021-01-07 ENCOUNTER — Other Ambulatory Visit: Payer: Self-pay | Admitting: *Deleted

## 2021-01-07 ENCOUNTER — Ambulatory Visit: Payer: Self-pay | Admitting: *Deleted

## 2021-01-07 DIAGNOSIS — N2581 Secondary hyperparathyroidism of renal origin: Secondary | ICD-10-CM | POA: Diagnosis not present

## 2021-01-07 DIAGNOSIS — N186 End stage renal disease: Secondary | ICD-10-CM | POA: Diagnosis not present

## 2021-01-07 DIAGNOSIS — D631 Anemia in chronic kidney disease: Secondary | ICD-10-CM | POA: Diagnosis not present

## 2021-01-07 DIAGNOSIS — Z992 Dependence on renal dialysis: Secondary | ICD-10-CM | POA: Diagnosis not present

## 2021-01-07 DIAGNOSIS — D509 Iron deficiency anemia, unspecified: Secondary | ICD-10-CM | POA: Diagnosis not present

## 2021-01-07 NOTE — Patient Outreach (Signed)
[place] [facility]) Care Management  01/07/2021  [name] 06/22/1951 [id number]   Telephone Assessment-Successful Recent discharged and primary completed transition of care  RN spoke with caregiver sister [name]) today. Reports pt continue to have dialysis MWF. Pt is bilateral amputee and recent fractured her right femur. Pt is blind and wheelchair bounded. States pt has 4 wounds to her right leg from the previous amputations (healing). Caregiver reports pt is doing well with her chair mobility and participates with her ADLs. Currently pending involvement with [facility] for PT/RN/Aide services. Pt has several medical condition however all controlled and managed well.  Diabetes -glucose readings always under 150 on SSI  HF-controlled with her weekly dialysis and medications  No immediate needs as caregiver aware to call for immediate needs and has this case manager's contact number for direct needs. Will send PCP and pt [facility] letters of involvement and follow up next month for ongoing case management needs as pt continue to recover.  [name], RN Care Management Coordinator [place] Office [phone]

## 2021-01-09 ENCOUNTER — Encounter (HOSPITAL_COMMUNITY): Payer: Self-pay | Admitting: *Deleted

## 2021-01-11 ENCOUNTER — Ambulatory Visit
Admission: EM | Admit: 2021-01-11 | Discharge: 2021-01-11 | Disposition: A | Discharge status: Home / Self Care | Payer: Medicare Other | Attending: Emergency Medicine | Admitting: Emergency Medicine

## 2021-01-11 ENCOUNTER — Other Ambulatory Visit: Payer: Self-pay

## 2021-01-11 DIAGNOSIS — N2581 Secondary hyperparathyroidism of renal origin: Secondary | ICD-10-CM | POA: Diagnosis not present

## 2021-01-11 DIAGNOSIS — H9193 Unspecified hearing loss, bilateral: Secondary | ICD-10-CM

## 2021-01-11 DIAGNOSIS — N186 End stage renal disease: Secondary | ICD-10-CM | POA: Diagnosis not present

## 2021-01-11 DIAGNOSIS — D509 Iron deficiency anemia, unspecified: Secondary | ICD-10-CM | POA: Diagnosis not present

## 2021-01-11 DIAGNOSIS — Z992 Dependence on renal dialysis: Secondary | ICD-10-CM | POA: Diagnosis not present

## 2021-01-11 DIAGNOSIS — D631 Anemia in chronic kidney disease: Secondary | ICD-10-CM | POA: Diagnosis not present

## 2021-01-11 MED ORDER — CARBAMIDE PEROXIDE 6.5 % OT SOLN: 5.0000 [drp] | Freq: Two times a day (BID) | OTIC | 0 refills | Status: AC

## 2021-01-11 MED ORDER — FLUTICASONE PROPIONATE 50 MCG/ACT NA SUSP: 1.0000 | Freq: Every day | NASAL | 0 refills | Status: AC

## 2021-01-11 NOTE — ED Triage Notes (Signed)
Three weeks h/o bilateral ear pain. Has not stuck any items into her ears.

## 2021-01-11 NOTE — Discharge Instructions (Addendum)
Please follow-up with ENT for further evaluation of decreased hearing Use Flonase nasal spray 1 to 2 spray in each nostril daily May use over-the-counter Debrox to further help remove wax

## 2021-01-11 NOTE — ED Provider Notes (Signed)
[facility]    CSN: [id number] Arrival date & time: 01/11/21  1527      History   Chief Complaint No chief complaint on file. Decreased hearing  HPI [name] is a 70 y.o. female history of CHF, ESRD, GERD, PAD, DM type II, presenting today for evaluation of ear blockage.  Reports decreased hearing out of bilateral ears over the past few weeks.  Initially symptoms more on the right side, but now more noticeable on the left.  History of cerumen impaction.  Denies any recent congestion or drainage.  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    Esophageal ulcer with bleeding    ESRD (end stage renal disease) ([place])    "suppose to start dialysis today" (06/15/2013)   Family history of adverse reaction to anesthesia    It took a while for pt sister to wake from anesthesia   GERD (gastroesophageal reflux disease)    GI bleed    Glaucoma    blind in both eyes   Heart murmur    Mild AS, moderate MR, moderate TR 10/30/16 echo   Hx of adenomatous colonic polyps 04/07/2018   Hypertension    Myalgia 12/31/2011   Neuropathy 12/31/2011   PAD (peripheral artery disease) ([place])    nonviable tissue left lower extremity   Shortness of breath    "when she doesn't go to dialysis"   Type II diabetes mellitus ([place])    Type II    Patient Active Problem List   Diagnosis Date Noted   Closed right hip fracture ([place]) 12/22/2020   Benign neoplasm of colon    GI bleed 05/23/2020   Upper GI bleed 05/22/2020   Angiodysplasia of intestine with hemorrhage 01/24/2020   Acute blood loss anemia 12/16/2019   Hypotension 12/16/2019   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[place]  11/17/2019   Infection of amputation stump, left lower extremity ([place]) 10/19/2019   Peripheral artery disease ([place]) 09/30/2019   Gangrene of foot ([place]) 08/30/2019   Other fatigue 02/28/2019   Finger pain, left 02/03/2019   Trigger ring finger of left hand 02/03/2019   Osteomyelitis, unspecified ([place]) 11/16/2018   Diabetic retinopathy associated with type 2 diabetes mellitus ([place]) 11/15/2018   Rheumatoid factor positive 11/15/2018   Anemia in ESRD (end-stage renal disease) ([place]) 05/06/2018   Asthma 05/06/2018   Hx of adenomatous colonic polyps 04/07/2018   Status post carpal tunnel release 12/10/2017   Carpal tunnel syndrome, left upper limb 11/10/2017   Carpal tunnel syndrome, right upper limb 11/10/2017   Bilateral hand numbness 09/28/2017   Dependence on renal dialysis ([place]) 09/11/2017   Symptomatic anemia 07/06/2017   Hypertension 07/06/2017   Chronic combined systolic and diastolic CHF (congestive heart failure) ([place]) 79/39/0300   Complicated migraine 92/33/0076   ESRD on dialysis ([place]) 06/04/2017   Blindness of both eyes 06/04/2017   Glaucoma 06/04/2017   Essential hypertension 10/27/2016   GERD (gastroesophageal reflux disease) 10/27/2016   Hypercalcemia 05/28/2015   Abnormal stress test 03/15/2015   Poor venous access 02/08/2015   Diarrhea, unspecified 11/02/2014  Fever, unspecified 11/02/2014   Pain, unspecified 11/02/2014   Pruritus, unspecified 11/02/2014   Shortness of breath 07/28/2014   Acquired absence of eye 03/31/2014   Coagulation defect, unspecified ([place]) 03/22/2014   Dysuria 02/14/2014   Encounter for immunization 01/03/2014   Iron deficiency anemia, unspecified 10/18/2013   Unspecified protein-calorie malnutrition ([place]) 95/62/1308   Complication of vascular dialysis catheter 06/21/2013   Fibromyalgia 06/21/2013   Personal history of breast cancer 06/21/2013   Personal history of other diseases of the musculoskeletal system and connective tissue  06/21/2013   Type 2 diabetes mellitus with diabetic polyneuropathy ([place]) 06/21/2013   Secondary hyperparathyroidism of renal origin ([employer]) 06/20/2013   Unspecified complication of cardiac and vascular prosthetic device, implant and graft, subsequent encounter 06/20/2013   Type II diabetes mellitus with renal manifestations ([place]) 06/15/2013   Neuropathy 12/31/2011   Breast cancer of upper-inner quadrant of left female breast ([place]) 06/27/2011    Past Surgical History:  Procedure Laterality Date   A/V FISTULAGRAM N/A 09/21/2018   Procedure: A/V FISTULAGRAM - Right Upper;  Surgeon: [name], MD;  Location: [place] [facility];  Service: Cardiovascular;  Laterality: N/A;   ABDOMINAL AORTOGRAM N/A 11/30/2018   Procedure: ABDOMINAL AORTOGRAM;  Surgeon: [name], MD;  Location: [place] [facility];  Service: Cardiovascular;  Laterality: N/A;   ABDOMINAL AORTOGRAM W/LOWER EXTREMITY Left 07/29/2019   Procedure: ABDOMINAL AORTOGRAM W/LOWER EXTREMITY;  Surgeon: [name], MD;  Location: [place] [facility];  Service: Cardiovascular;  Laterality: Left;   ABDOMINAL HYSTERECTOMY     partial   AMPUTATION Left 08/30/2019   Procedure: AMPUTATION BELOW KNEE LEFT;  Surgeon: [name], MD;  Location: [place];  Service: Vascular;  Laterality: Left;   AMPUTATION Left 10/21/2019   Procedure: Amputation Above Knee;  Surgeon: Rosetta Posner, MD;  Location: [facility];  Service: Vascular;  Laterality: Left;   AMPUTATION Right 11/17/2019   Procedure: AMPUTATION ABOVE KNEE, right leg;  Surgeon: Rosetta Posner, MD;  Location: [place];  Service: Vascular;  Laterality: Right;   AV FISTULA PLACEMENT Right 06/06/2013   Procedure: ARTERIOVENOUS (AV) FISTULA CREATION-RIGHT BRACHIAL CEPHALIC;  Surgeon: Conrad [place], MD;  Location: [place];  Service: Vascular;  Laterality: Right;   BREAST BIOPSY Left    BREAST LUMPECTOMY Left    "and took out some lymph nodes" (06/15/2013)   CARDIAC CATHETERIZATION     04/02/15  [facility]): no angiographic CAD, LVEF 40% with global hypokinesis (LHC done for + stress echo, EF 45% 11/28/14)   CARPAL TUNNEL RELEASE Left 11/26/2017   Procedure: LEFT CARPAL TUNNEL RELEASE;  Surgeon: [name], MD;  Location: [place];  Service: Orthopedics;  Laterality: Left;   CATARACT EXTRACTION W/ ANTERIOR VITRECTOMY Bilateral    CESAREAN SECTION  1980   COLONOSCOPY     COLONOSCOPY WITH PROPOFOL N/A 05/25/2020   Procedure: COLONOSCOPY WITH PROPOFOL;  Surgeon: Yetta Flock, MD;  Location: [place];  Service: Gastroenterology;  Laterality: N/A;   ENTEROSCOPY N/A 12/18/2019   Procedure: ENTEROSCOPY;  Surgeon: Rush Landmark Telford Nab., MD;  Location: [place];  Service: Gastroenterology;  Laterality: N/A;   ENTEROSCOPY N/A 05/24/2020   Procedure: ENTEROSCOPY;  Surgeon: Jackquline Denmark, MD;  Location: [facility] ENDOSCOPY;  Service: Endoscopy;  Laterality: N/A;   ESOPHAGOGASTRODUODENOSCOPY N/A 07/08/2017   Procedure: ESOPHAGOGASTRODUODENOSCOPY (EGD);  Surgeon: [name], MD;  Location: [facility] ENDOSCOPY;  Service: Endoscopy;  Laterality: N/A;   ESOPHAGOGASTRODUODENOSCOPY (EGD) WITH PROPOFOL N/A 02/07/2018   Procedure:  ESOPHAGOGASTRODUODENOSCOPY (EGD) WITH PROPOFOL;  Surgeon: [name], MD;  Location: [place];  Service: Gastroenterology;  Laterality: N/A;  APC and clips placed   EYE SURGERY Bilateral    laser surgery   GIVENS CAPSULE STUDY N/A 12/19/2019   Procedure: GIVENS CAPSULE STUDY;  Surgeon: Irving Copas., MD;  Location: [place];  Service: Gastroenterology;  Laterality: N/A;   HOT HEMOSTASIS N/A 12/18/2019   Procedure: HOT HEMOSTASIS (ARGON PLASMA COAGULATION/BICAP);  Surgeon: Irving Copas., MD;  Location: [place];  Service: Gastroenterology;  Laterality: N/A;   HOT HEMOSTASIS Right 05/24/2020   Procedure: HOT HEMOSTASIS (ARGON PLASMA COAGULATION/BICAP);  Surgeon: Jackquline Denmark, MD;  Location: [facility] ENDOSCOPY;  Service: Endoscopy;  Laterality:  Right;  r colon   INTRAMEDULLARY (IM) NAIL INTERTROCHANTERIC Right 12/23/2020   Procedure: INTRAMEDULLARY (IM) AFFIXUS NAIL INTERTROCHANTRIC;  Surgeon: Altamese [place], MD;  Location: [place];  Service: Orthopedics;  Laterality: Right;   LOWER EXTREMITY ANGIOGRAPHY Bilateral 11/30/2018   Procedure: LOWER EXTREMITY ANGIOGRAPHY;  Surgeon: [name], MD;  Location: [place] [facility];  Service: Cardiovascular;  Laterality: Bilateral;   PARS PLANA VITRECTOMY Right 05/05/2017   Procedure: PARS PLANA VITRECTOMY WITH 25 GAUGE; PARTIAL REMOVAL OF OIL; INFERIOR PERIPHERAL IRIDECTOMY, REFORM ANTERIOR CHAMBER RIGHT EYE;  Surgeon: [name], MD;  Location: [place];  Service: Ophthalmology;  Laterality: Right;   PERIPHERAL VASCULAR BALLOON ANGIOPLASTY Right 09/21/2018   Procedure: PERIPHERAL VASCULAR BALLOON ANGIOPLASTY;  Surgeon: [name], MD;  Location: [place] [facility];  Service: Cardiovascular;  Laterality: Right;  AV fistula   PERIPHERAL VASCULAR BALLOON ANGIOPLASTY Left 11/30/2018   Procedure: PERIPHERAL VASCULAR BALLOON ANGIOPLASTY;  Surgeon: [name], MD;  Location: [place] [facility];  Service: Cardiovascular;  Laterality: Left;  Left Anterior Tibial Artery   POLYPECTOMY  05/25/2020   Procedure: POLYPECTOMY;  Surgeon: Yetta Flock, MD;  Location: [place] [facility];  Service: Gastroenterology;;   REFRACTIVE SURGERY Bilateral    REMOVAL OF A DIALYSIS CATHETER Right 06/06/2013   Procedure: REMOVAL OF RIGHT MEDIPORT;  Surgeon: Conrad Yemassee, MD;  Location: [place];  Service: Vascular;  Laterality: Right;   SUBMUCOSAL TATTOO INJECTION  12/18/2019   Procedure: SUBMUCOSAL TATTOO INJECTION;  Surgeon: Irving Copas., MD;  Location: [place];  Service: Gastroenterology;;   TONSILLECTOMY     UPPER GASTROINTESTINAL ENDOSCOPY     WOUND DEBRIDEMENT Left 09/30/2019   Procedure: IRRIGATION AND DEBRIDEMENT WOUND OF LEFT BELOW KNEE AMPUTATION;  Surgeon: [name], MD;  Location:  [place];  Service: Vascular;  Laterality: Left;    OB History   No obstetric history on file.      Home Medications    Prior to Admission medications   Medication Sig Start Date End Date Taking? Authorizing Provider  carbamide peroxide (DEBROX) 6.5 % OTIC solution Place 5 drops into the right ear 2 (two) times daily for 4 days. 01/11/21 01/15/21 Yes Noralyn Karim C, PA-C  fluticasone (FLONASE) 50 MCG/ACT nasal spray Place 1-2 sprays into both nostrils daily. 01/11/21  Yes Javaya [place] C, PA-C  albuterol (VENTOLIN HFA) 108 (90 Base) MCG/ACT inhaler Inhale 2 puffs into the lungs every 6 (six) hours as needed for wheezing or shortness of breath.     [provider]  apixaban (ELIQUIS) 2.5 MG TABS tablet Take 1 tablet (2.5 mg total) by mouth 2 (two) times daily. 12/27/20 01/26/21  Ainsley Spinner, PA-C  atropine 1 % ophthalmic solution Place 1 drop into the right eye 2 (  two) times daily as needed (dry eyes).  07/04/17   [provider]  BD PEN NEEDLE NANO 2ND GEN 32G X 4 MM MISC in the morning, at noon, and at bedtime.  03/03/19   [provider]  darbepoetin (ARANESP) 200 MCG/0.4ML SOLN injection Inject 0.4 mLs (200 mcg total) into the vein every Wednesday with hemodialysis. 06/22/13   Geradine Girt, DO  diclofenac sodium (VOLTAREN) 1 % GEL Apply 2 g topically 4 (four) times daily as needed (pain).  04/28/19   [provider]  diphenhydrAMINE (BENADRYL) 25 MG tablet Take 25 mg by mouth See admin instructions. Take one tablet (25 mg) by mouth daily at bedtime, and take one tablet (25 mg) by mouth on Monday, Wednesday, Friday mornings before dialysis    [provider]  docusate sodium (COLACE) 100 MG capsule Take 1 capsule (100 mg total) by mouth 2 (two) times daily. 12/27/20   Mariel Aloe, MD  doxercalciferol (HECTOROL) 4 MCG/2ML injection Inject 0.5 mLs (1 mcg total) into the vein every Monday, Wednesday, and Friday with hemodialysis. 06/18/13   Geradine Girt, DO  gabapentin (NEURONTIN) 100 MG capsule Take 100 mg by mouth in the morning.    [provider]  Hydrocortisone Acetate 1 % OINT Apply 1 application topically as needed (inflammation). 10/03/20   [provider]  HYDROmorphone (DILAUDID) 2 MG tablet Take 1 tablet (2 mg total) by mouth every 6 (six) hours as needed for moderate pain or severe pain. 12/27/20   Ainsley Spinner, PA-C  insulin aspart (NOVOLOG) 100 UNIT/ML FlexPen Inject 6-8 Units into the skin 2 (two) times daily with a meal. 6 units in the evening, 8 units in the morning    [provider]  lanthanum (FOSRENOL) 1000 MG chewable tablet Chew 2,000-4,000 mg by mouth See admin instructions. Chew 4 tablets (4000 mg) by mouth with each meal and 2 tablets (2000 mg) with snacks    [provider]  multivitamin (RENA-VIT) TABS tablet Take 1 tablet by mouth daily. 12/20/19   [provider]  nystatin ointment (MYCOSTATIN) Apply topically 2 (two) times daily. 10/23/20   [provider]  omeprazole (PRILOSEC) 40 MG capsule Take 1 capsule (40 mg total) by mouth daily. 06/11/20   [place], Amy S, PA-C  ONETOUCH VERIO test strip 1 each by Other route 3 (three) times daily.  12/15/17   [provider]  OXYGEN Inhale 3 L into the lungs continuous.    [provider]  prednisoLONE acetate (PRED FORTE) 1 % ophthalmic suspension Place 1 drop into the right eye daily. For eye pain 04/27/17   [provider]  traZODone (DESYREL) 100 MG tablet Take 100 mg by mouth at bedtime. 11/29/20   [provider]    Family History Family History  Problem Relation Age of Onset   Diabetes Mother    Hyperlipidemia Mother    Hypertension Mother    Hypertension Father    Diabetes Sister    Diabetes Brother    Hypertension Brother    Heart attack Brother    Kidney disease Brother    Colon cancer Neg Hx    Colon polyps Neg Hx    Esophageal cancer Neg Hx    Gallbladder disease  Neg Hx    Rectal cancer Neg Hx    Stomach cancer Neg Hx     Social History Social History   Tobacco Use   Smoking status: Never   Smokeless tobacco: Never  Vaping Use   Vaping Use: Never used  Substance Use Topics   Alcohol use: No   Drug use: No     Allergies   Tape, Latex, Oxycodone, Tramadol, Aspirin, Morphine and related, and Vicodin [hydrocodone-acetaminophen]   Review of Systems Review of Systems  Constitutional:  Negative for activity change, appetite change, chills, fatigue and fever.  HENT:  Positive for ear pain and hearing loss. Negative for congestion, facial swelling, rhinorrhea, sinus pressure, sore throat and trouble swallowing.   Eyes:  Negative for discharge and redness.  Respiratory:  Negative for cough, chest tightness and shortness of breath.   Cardiovascular:  Negative for chest pain.  Gastrointestinal:  Negative for abdominal pain, diarrhea, nausea and vomiting.  Musculoskeletal:  Negative for myalgias.  Skin:  Negative for rash.  Neurological:  Negative for dizziness, light-headedness and headaches.    Physical Exam Triage Vital Signs ED Triage Vitals  Enc Vitals Group     BP      Pulse      Resp      Temp      Temp src      SpO2      Weight      Height      Head Circumference      Peak Flow      Pain Score      Pain Loc      Pain Edu?      Excl. in [place]?    No data found.  Updated Vital Signs BP 122/64 (BP Location: Left Arm)   Pulse 84   Temp 98.5 F (36.9 C) (Oral)   Resp 16   LMP  (LMP Unknown)   SpO2 92%   Visual Acuity Right Eye Distance:   Left Eye Distance:   Bilateral Distance:    Right Eye Near:   Left Eye Near:    Bilateral Near:     Physical Exam Vitals and nursing note reviewed.  Constitutional:      Appearance: She is well-developed.     Comments: No acute distress  HENT:     Head: Normocephalic and atraumatic.     Ears:     Comments: Right canal with mild cerumen present, feels very hard and firm  with curette, TM largely visualized and intact, good bony landmarks and cone of light  Left canal with minimal cerumen, left TM with good bony landmarks and cone of light    Nose: Nose normal.     Mouth/Throat:     Comments: Oral mucosa pink and moist, no tonsillar enlargement or exudate. Posterior pharynx patent and nonerythematous, no uvula deviation or swelling. Normal phonation.  Eyes:     Conjunctiva/sclera: Conjunctivae normal.  Cardiovascular:     Rate and Rhythm: Normal rate.  Pulmonary:     Effort: Pulmonary effort is normal. No respiratory distress.  Abdominal:     General: There is no distension.  Musculoskeletal:        General: Normal range of motion.     Cervical back: Neck supple.  Skin:    General: Skin is warm and dry.  Neurological:     Mental Status: She is alert and oriented to person, place, and time.     UC Treatments / Results  Labs (all labs ordered are listed, but only abnormal results are displayed) Labs Reviewed - No data to display  EKG   Radiology No results found.  Procedures Procedures (including critical care time)  Medications Ordered in UC Medications -  No data to display  Initial Impression / Assessment and Plan / UC Course  I have reviewed the triage vital signs and the nursing notes.  Pertinent labs & imaging results that were available during my care of the patient were reviewed by me and considered in my medical decision making (see chart for details).     Decreased hearing-no sign of full cerumen impaction that would be expected to intact hearing, attempted removal of small amount of cerumen on the right without success with irrigation and curette manipulation.  Will treat for underlying possible eustachian tube dysfunction with Flonase and encouraged follow-up with ENT for further evaluation of hearing loss without clear cause.  Discussed strict return precautions. Patient verbalized understanding and is agreeable with  plan.  Final Clinical Impressions(s) / UC Diagnoses   Final diagnoses:  Decreased hearing of both ears     Discharge Instructions      Please follow-up with ENT for further evaluation of decreased hearing Use Flonase nasal spray 1 to 2 spray in each nostril daily May use over-the-counter Debrox to further help remove wax     ED Prescriptions     Medication Sig Dispense Auth. Provider   fluticasone (FLONASE) 50 MCG/ACT nasal spray Place 1-2 sprays into both nostrils daily. 16 g Lalonnie Shaffer C, PA-C   carbamide peroxide (DEBROX) 6.5 % OTIC solution Place 5 drops into the right ear 2 (two) times daily for 4 days. 15 mL Maalik Pinn, [place] C, PA-C      PDMP not reviewed this encounter.   Janith Lima, [place] 01/11/21 1835

## 2021-01-14 ENCOUNTER — Encounter (HOSPITAL_COMMUNITY): Payer: Self-pay | Admitting: Emergency Medicine

## 2021-01-14 ENCOUNTER — Emergency Department (HOSPITAL_COMMUNITY)
Admission: EM | Admit: 2021-01-14 | Discharge: 2021-01-14 | Disposition: A | Discharge status: Home / Self Care | Payer: Medicare Other | Attending: Emergency Medicine | Admitting: Emergency Medicine

## 2021-01-14 DIAGNOSIS — Z91040 Latex allergy status: Secondary | ICD-10-CM | POA: Insufficient documentation

## 2021-01-14 DIAGNOSIS — D631 Anemia in chronic kidney disease: Secondary | ICD-10-CM | POA: Diagnosis not present

## 2021-01-14 DIAGNOSIS — R110 Nausea: Secondary | ICD-10-CM

## 2021-01-14 DIAGNOSIS — I132 Hypertensive heart and chronic kidney disease with heart failure and with stage 5 chronic kidney disease, or end stage renal disease: Secondary | ICD-10-CM | POA: Diagnosis not present

## 2021-01-14 DIAGNOSIS — J45909 Unspecified asthma, uncomplicated: Secondary | ICD-10-CM | POA: Insufficient documentation

## 2021-01-14 DIAGNOSIS — R197 Diarrhea, unspecified: Secondary | ICD-10-CM | POA: Diagnosis not present

## 2021-01-14 DIAGNOSIS — E11319 Type 2 diabetes mellitus with unspecified diabetic retinopathy without macular edema: Secondary | ICD-10-CM | POA: Insufficient documentation

## 2021-01-14 DIAGNOSIS — Z794 Long term (current) use of insulin: Secondary | ICD-10-CM | POA: Diagnosis not present

## 2021-01-14 DIAGNOSIS — Z7952 Long term (current) use of systemic steroids: Secondary | ICD-10-CM | POA: Diagnosis not present

## 2021-01-14 DIAGNOSIS — N186 End stage renal disease: Secondary | ICD-10-CM | POA: Diagnosis not present

## 2021-01-14 DIAGNOSIS — Z7901 Long term (current) use of anticoagulants: Secondary | ICD-10-CM | POA: Insufficient documentation

## 2021-01-14 DIAGNOSIS — Z853 Personal history of malignant neoplasm of breast: Secondary | ICD-10-CM | POA: Insufficient documentation

## 2021-01-14 DIAGNOSIS — E1122 Type 2 diabetes mellitus with diabetic chronic kidney disease: Secondary | ICD-10-CM | POA: Diagnosis not present

## 2021-01-14 DIAGNOSIS — R109 Unspecified abdominal pain: Secondary | ICD-10-CM | POA: Insufficient documentation

## 2021-01-14 DIAGNOSIS — R112 Nausea with vomiting, unspecified: Secondary | ICD-10-CM | POA: Diagnosis not present

## 2021-01-14 DIAGNOSIS — Z79899 Other long term (current) drug therapy: Secondary | ICD-10-CM | POA: Diagnosis not present

## 2021-01-14 DIAGNOSIS — I959 Hypotension, unspecified: Secondary | ICD-10-CM | POA: Diagnosis not present

## 2021-01-14 DIAGNOSIS — D509 Iron deficiency anemia, unspecified: Secondary | ICD-10-CM | POA: Diagnosis not present

## 2021-01-14 DIAGNOSIS — Z992 Dependence on renal dialysis: Secondary | ICD-10-CM | POA: Diagnosis not present

## 2021-01-14 DIAGNOSIS — N2581 Secondary hyperparathyroidism of renal origin: Secondary | ICD-10-CM | POA: Diagnosis not present

## 2021-01-14 DIAGNOSIS — R1111 Vomiting without nausea: Secondary | ICD-10-CM | POA: Diagnosis not present

## 2021-01-14 DIAGNOSIS — I5042 Chronic combined systolic (congestive) and diastolic (congestive) heart failure: Secondary | ICD-10-CM | POA: Diagnosis not present

## 2021-01-14 LAB — CBC WITH DIFFERENTIAL/PLATELET
Abs Immature Granulocytes: 0 10*3/uL (ref 0.00–0.07)
Basophils Absolute: 0.1 10*3/uL (ref 0.0–0.1)
Basophils Relative: 2 %
Eosinophils Absolute: 0.1 10*3/uL (ref 0.0–0.5)
Eosinophils Relative: 1 %
HCT: 25.5 % — ABNORMAL LOW (ref 36.0–46.0)
Hemoglobin: 8 g/dL — ABNORMAL LOW (ref 12.0–15.0)
Lymphocytes Relative: 11 %
Lymphs Abs: 0.6 10*3/uL — ABNORMAL LOW (ref 0.7–4.0)
MCH: 32.8 pg (ref 26.0–34.0)
MCHC: 31.4 g/dL (ref 30.0–36.0)
MCV: 104.5 fL — ABNORMAL HIGH (ref 80.0–100.0)
Monocytes Absolute: 0.1 10*3/uL (ref 0.1–1.0)
Monocytes Relative: 2 %
Neutro Abs: 4.2 10*3/uL (ref 1.7–7.7)
Neutrophils Relative %: 84 %
Platelets: 222 10*3/uL (ref 150–400)
RBC: 2.44 MIL/uL — ABNORMAL LOW (ref 3.87–5.11)
RDW: 21.2 % — ABNORMAL HIGH (ref 11.5–15.5)
WBC: 5 10*3/uL (ref 4.0–10.5)
nRBC: 0 % (ref 0.0–0.2)
nRBC: 0 /100 WBC

## 2021-01-14 LAB — LIPASE, BLOOD: Lipase: 23 U/L (ref 11–51)

## 2021-01-14 LAB — COMPREHENSIVE METABOLIC PANEL
ALT: 12 U/L (ref 0–44)
AST: 29 U/L (ref 15–41)
Albumin: 2.8 g/dL — ABNORMAL LOW (ref 3.5–5.0)
Alkaline Phosphatase: 105 U/L (ref 38–126)
Anion gap: 13 (ref 5–15)
BUN: 8 mg/dL (ref 8–23)
CO2: 30 mmol/L (ref 22–32)
Calcium: 8 mg/dL — ABNORMAL LOW (ref 8.9–10.3)
Chloride: 96 mmol/L — ABNORMAL LOW (ref 98–111)
Creatinine, Ser: 3.65 mg/dL — ABNORMAL HIGH (ref 0.44–1.00)
GFR, Estimated: 13 mL/min — ABNORMAL LOW (ref >60)
Glucose, Bld: 90 mg/dL (ref 70–99)
Potassium: 2.9 mmol/L — ABNORMAL LOW (ref 3.5–5.1)
Sodium: 139 mmol/L (ref 135–145)
Total Bilirubin: 1.9 mg/dL — ABNORMAL HIGH (ref 0.3–1.2)
Total Protein: 6.1 g/dL — ABNORMAL LOW (ref 6.5–8.1)

## 2021-01-14 LAB — TYPE AND SCREEN
ABO/RH(D): A POS
Antibody Screen: NEGATIVE

## 2021-01-14 LAB — POC OCCULT BLOOD, ED: Fecal Occult Bld: NEGATIVE

## 2021-01-14 MED ORDER — ONDANSETRON HCL 4 MG PO TABS: 4.0000 mg | ORAL_TABLET | Freq: Four times a day (QID) | ORAL | 0 refills | Status: AC

## 2021-01-14 MED ORDER — ONDANSETRON 4 MG PO TBDP
4.0000 mg | ORAL_TABLET | Freq: Once | ORAL | Status: AC
  Administered 2021-01-14: 4 mg via ORAL
  Filled 2021-01-14: qty 1

## 2021-01-14 NOTE — ED Provider Notes (Signed)
Emergency Medicine Provider Triage Evaluation Note  [name] , a 70 y.o. female  was evaluated in triage.  Pt complains of nausea and abnormal lab. Had hgb of 7 at dialysis.  Review of Systems  Positive: Nausea, anemia Negative: fever  Physical Exam  LMP  (LMP Unknown)  Gen:   Awake, no distress   Resp:  Normal effort  MSK:   Moves extremities without difficulty  Other:    Medical Decision Making  Medically screening exam initiated at 2:01 PM.  Appropriate orders placed.  [name] was informed that the remainder of the evaluation will be completed by another provider, this initial triage assessment does not replace that evaluation, and the importance of remaining in the ED until their evaluation is complete.     Rodney Booze, PA-C 01/14/21 1401    Lorelle Gibbs, DO 01/17/21 1108

## 2021-01-14 NOTE — ED Provider Notes (Addendum)
[facility] EMERGENCY DEPARTMENT Provider Note   CSN: [id number] Arrival date & time: 01/14/21  1309     History Chief Complaint  Patient presents with   Illness          [name] is a 70 y.o. female.  With supposed with hemoglobin of 7 at dialysis today.  She has been having some loose stools and some nausea and vomiting.  Patient is blind but states that someone said that her stool looked dark.  She has a history of GI bleeds in the past.  She is not having any current abdominal pain, nausea or vomiting.  The history is provided by the patient.  Illness Severity:  Mild Onset quality:  Gradual Duration:  2 weeks Timing:  Intermittent Progression:  Waxing and waning Chronicity:  New Relieved by:  Nothing Worsened by:  Nothing Associated symptoms: abdominal pain (intermittent), diarrhea, nausea and vomiting   Associated symptoms: no chest pain, no congestion, no cough, no ear pain, no fatigue, no fever, no headaches, no loss of consciousness, no rash, no rhinorrhea, no shortness of breath, no sore throat and no wheezing       Past Medical History:  Diagnosis Date   Anemia    Anemia    Anxiety    Arthritis    "joints" (06/15/2013)   Asthma    Blind in both eyes    caused by glaucoma   Blood transfusion without reported diagnosis    Breast cancer ([place])    left   CHF (congestive heart failure) ([place])    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    Esophageal ulcer with bleeding    ESRD (end stage renal disease) ([place])    "suppose to start dialysis today" (06/15/2013)   Family history of adverse reaction to anesthesia    It took a while for pt sister to wake from anesthesia   GERD (gastroesophageal reflux disease)    GI bleed    Glaucoma    blind in both eyes   Heart murmur    Mild AS, moderate MR, moderate TR 10/30/16 echo   Hx of adenomatous colonic polyps 04/07/2018   Hypertension    Myalgia 12/31/2011   Neuropathy  12/31/2011   PAD (peripheral artery disease) ([place])    nonviable tissue left lower extremity   Shortness of breath    "when she doesn't go to dialysis"   Type II diabetes mellitus ([place])    Type II    Patient Active Problem List   Diagnosis Date Noted   Closed right hip fracture ([place]) 12/22/2020   Benign neoplasm of colon    GI bleed 05/23/2020   Upper GI bleed 05/22/2020   Angiodysplasia of intestine with hemorrhage 01/24/2020   Acute blood loss anemia 12/16/2019   Hypotension 12/16/2019   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[place]) 11/17/2019   Infection of amputation stump, left lower extremity ([place]) 10/19/2019   Peripheral artery disease ([place]) 09/30/2019   Gangrene of foot ([place]) 08/30/2019   Other fatigue 02/28/2019   Finger pain, left 02/03/2019   Trigger ring finger of left hand 02/03/2019   Osteomyelitis, unspecified ([place]) 11/16/2018   Diabetic retinopathy associated with type 2 diabetes mellitus ([place]) 11/15/2018   Rheumatoid factor positive 11/15/2018   Anemia in ESRD (end-stage renal disease) ([place]) 05/06/2018   Asthma 05/06/2018   Hx of adenomatous colonic polyps 04/07/2018   Status post carpal tunnel release 12/10/2017   Carpal tunnel syndrome, left upper limb  11/10/2017   Carpal tunnel syndrome, right upper limb 11/10/2017   Bilateral hand numbness 09/28/2017   Dependence on renal dialysis ([place]) 09/11/2017   Symptomatic anemia 07/06/2017   Hypertension 07/06/2017   Chronic combined systolic and diastolic CHF (congestive heart failure) ([place]) 58/85/0277   Complicated migraine 41/28/7867   ESRD on dialysis ([place]) 06/04/2017   Blindness of both eyes 06/04/2017   Glaucoma 06/04/2017   Essential hypertension 10/27/2016   GERD (gastroesophageal reflux disease) 10/27/2016   Hypercalcemia 05/28/2015   Abnormal stress test 03/15/2015   Poor venous access 02/08/2015   Diarrhea, unspecified 11/02/2014   Fever, unspecified 11/02/2014   Pain, unspecified 11/02/2014    Pruritus, unspecified 11/02/2014   Shortness of breath 07/28/2014   Acquired absence of eye 03/31/2014   Coagulation defect, unspecified ([place]) 03/22/2014   Dysuria 02/14/2014   Encounter for immunization 01/03/2014   Iron deficiency anemia, unspecified 10/18/2013   Unspecified protein-calorie malnutrition ([place]) 67/20/9470   Complication of vascular dialysis catheter 06/21/2013   Fibromyalgia 06/21/2013   Personal history of breast cancer 06/21/2013   Personal history of other diseases of the musculoskeletal system and connective tissue 06/21/2013   Type 2 diabetes mellitus with diabetic polyneuropathy ([place]) 06/21/2013   Secondary hyperparathyroidism of renal origin ([place]) 06/20/2013   Unspecified complication of cardiac and vascular prosthetic device, implant and graft, subsequent encounter 06/20/2013   Type II diabetes mellitus with renal manifestations ([place]) 06/15/2013   Neuropathy 12/31/2011   Breast cancer of upper-inner quadrant of left female breast ([place]) 06/27/2011    Past Surgical History:  Procedure Laterality Date   A/V FISTULAGRAM N/A 09/21/2018   Procedure: A/V FISTULAGRAM - Right Upper;  Surgeon: [name], MD;  Location: [place] [facility];  Service: Cardiovascular;  Laterality: N/A;   ABDOMINAL AORTOGRAM N/A 11/30/2018   Procedure: ABDOMINAL AORTOGRAM;  Surgeon: [name], MD;  Location: [place] [facility];  Service: Cardiovascular;  Laterality: N/A;   ABDOMINAL AORTOGRAM W/LOWER EXTREMITY Left 07/29/2019   Procedure: ABDOMINAL AORTOGRAM W/LOWER EXTREMITY;  Surgeon: [name], MD;  Location: [place] [facility];  Service: Cardiovascular;  Laterality: Left;   ABDOMINAL HYSTERECTOMY     partial   AMPUTATION Left 08/30/2019   Procedure: AMPUTATION BELOW KNEE LEFT;  Surgeon: [name], MD;  Location: [place];  Service: Vascular;  Laterality: Left;   AMPUTATION Left 10/21/2019   Procedure: Amputation Above Knee;  Surgeon: Rosetta Posner, MD;  Location: [place];  Service: Vascular;  Laterality: Left;   AMPUTATION Right 11/17/2019   Procedure: AMPUTATION ABOVE KNEE, right leg;  Surgeon: Rosetta Posner, MD;  Location: [place];  Service: Vascular;  Laterality: Right;   AV FISTULA PLACEMENT Right 06/06/2013   Procedure: ARTERIOVENOUS (AV) FISTULA CREATION-RIGHT BRACHIAL CEPHALIC;  Surgeon: Conrad Alpha, MD;  Location: [place];  Service: Vascular;  Laterality: Right;   BREAST BIOPSY Left    BREAST LUMPECTOMY Left    "and took out some lymph nodes" (06/15/2013)   CARDIAC CATHETERIZATION     04/02/15 [facility]): no angiographic CAD, LVEF 40% with global hypokinesis (LHC done for + stress echo, EF 45% 11/28/14)   CARPAL TUNNEL RELEASE Left 11/26/2017   Procedure: LEFT CARPAL TUNNEL RELEASE;  Surgeon: [name], MD;  Location: [place];  Service: Orthopedics;  Laterality: Left;   CATARACT EXTRACTION W/ ANTERIOR VITRECTOMY Bilateral    CESAREAN SECTION  1980   COLONOSCOPY     COLONOSCOPY WITH PROPOFOL N/A 05/25/2020   Procedure: COLONOSCOPY  WITH PROPOFOL;  Surgeon: Yetta Flock, MD;  Location: [place];  Service: Gastroenterology;  Laterality: N/A;   ENTEROSCOPY N/A 12/18/2019   Procedure: ENTEROSCOPY;  Surgeon: Rush Landmark Telford Nab., MD;  Location: [place];  Service: Gastroenterology;  Laterality: N/A;   ENTEROSCOPY N/A 05/24/2020   Procedure: ENTEROSCOPY;  Surgeon: Jackquline Denmark, MD;  Location: [facility] ENDOSCOPY;  Service: Endoscopy;  Laterality: N/A;   ESOPHAGOGASTRODUODENOSCOPY N/A 07/08/2017   Procedure: ESOPHAGOGASTRODUODENOSCOPY (EGD);  Surgeon: [name], MD;  Location: [facility] ENDOSCOPY;  Service: Endoscopy;  Laterality: N/A;   ESOPHAGOGASTRODUODENOSCOPY (EGD) WITH PROPOFOL N/A 02/07/2018   Procedure: ESOPHAGOGASTRODUODENOSCOPY (EGD) WITH PROPOFOL;  Surgeon: [name], MD;  Location: [facility] ENDOSCOPY;  Service: Gastroenterology;  Laterality: N/A;  APC and clips placed   EYE SURGERY Bilateral    laser surgery   GIVENS CAPSULE STUDY N/A  12/19/2019   Procedure: GIVENS CAPSULE STUDY;  Surgeon: Irving Copas., MD;  Location: [place];  Service: Gastroenterology;  Laterality: N/A;   HOT HEMOSTASIS N/A 12/18/2019   Procedure: HOT HEMOSTASIS (ARGON PLASMA COAGULATION/BICAP);  Surgeon: Irving Copas., MD;  Location: [place];  Service: Gastroenterology;  Laterality: N/A;   HOT HEMOSTASIS Right 05/24/2020   Procedure: HOT HEMOSTASIS (ARGON PLASMA COAGULATION/BICAP);  Surgeon: Jackquline Denmark, MD;  Location: [facility] ENDOSCOPY;  Service: Endoscopy;  Laterality: Right;  r colon   INTRAMEDULLARY (IM) NAIL INTERTROCHANTERIC Right 12/23/2020   Procedure: INTRAMEDULLARY (IM) AFFIXUS NAIL INTERTROCHANTRIC;  Surgeon: Altamese [place], MD;  Location: [place];  Service: Orthopedics;  Laterality: Right;   LOWER EXTREMITY ANGIOGRAPHY Bilateral 11/30/2018   Procedure: LOWER EXTREMITY ANGIOGRAPHY;  Surgeon: [name], MD;  Location: [place] CV LAB;  Service: Cardiovascular;  Laterality: Bilateral;   PARS PLANA VITRECTOMY Right 05/05/2017   Procedure: PARS PLANA VITRECTOMY WITH 25 GAUGE; PARTIAL REMOVAL OF OIL; INFERIOR PERIPHERAL IRIDECTOMY, REFORM ANTERIOR CHAMBER RIGHT EYE;  Surgeon: [name], MD;  Location: [place];  Service: Ophthalmology;  Laterality: Right;   PERIPHERAL VASCULAR BALLOON ANGIOPLASTY Right 09/21/2018   Procedure: PERIPHERAL VASCULAR BALLOON ANGIOPLASTY;  Surgeon: [name], MD;  Location: [place] CV LAB;  Service: Cardiovascular;  Laterality: Right;  AV fistula   PERIPHERAL VASCULAR BALLOON ANGIOPLASTY Left 11/30/2018   Procedure: PERIPHERAL VASCULAR BALLOON ANGIOPLASTY;  Surgeon: [name], MD;  Location: [place] CV LAB;  Service: Cardiovascular;  Laterality: Left;  Left Anterior Tibial Artery   POLYPECTOMY  05/25/2020   Procedure: POLYPECTOMY;  Surgeon: Yetta Flock, MD;  Location: [place] ENDOSCOPY;  Service: Gastroenterology;;   REFRACTIVE SURGERY Bilateral    REMOVAL OF A DIALYSIS  CATHETER Right 06/06/2013   Procedure: REMOVAL OF RIGHT MEDIPORT;  Surgeon: Conrad McKeesport, MD;  Location: [place];  Service: Vascular;  Laterality: Right;   SUBMUCOSAL TATTOO INJECTION  12/18/2019   Procedure: SUBMUCOSAL TATTOO INJECTION;  Surgeon: Irving Copas., MD;  Location: [place];  Service: Gastroenterology;;   TONSILLECTOMY     UPPER GASTROINTESTINAL ENDOSCOPY     WOUND DEBRIDEMENT Left 09/30/2019   Procedure: IRRIGATION AND DEBRIDEMENT WOUND OF LEFT BELOW KNEE AMPUTATION;  Surgeon: [name], MD;  Location: [place];  Service: Vascular;  Laterality: Left;     OB History   No obstetric history on file.     Family History  Problem Relation Age of Onset   Diabetes Mother    Hyperlipidemia Mother    Hypertension Mother    Hypertension Father    Diabetes Sister    Diabetes Brother  Hypertension Brother    Heart attack Brother    Kidney disease Brother    Colon cancer Neg Hx    Colon polyps Neg Hx    Esophageal cancer Neg Hx    Gallbladder disease Neg Hx    Rectal cancer Neg Hx    Stomach cancer Neg Hx     Social History   Tobacco Use   Smoking status: Never   Smokeless tobacco: Never  Vaping Use   Vaping Use: Never used  Substance Use Topics   Alcohol use: No   Drug use: No    Home Medications Prior to Admission medications   Medication Sig Start Date End Date Taking? Authorizing Provider  ondansetron (ZOFRAN) 4 MG tablet Take 1 tablet (4 mg total) by mouth every 6 (six) hours. 01/14/21  Yes Tyke Outman, DO  albuterol (VENTOLIN HFA) 108 (90 Base) MCG/ACT inhaler Inhale 2 puffs into the lungs every 6 (six) hours as needed for wheezing or shortness of breath.     [provider]  apixaban (ELIQUIS) 2.5 MG TABS tablet Take 1 tablet (2.5 mg total) by mouth 2 (two) times daily. 12/27/20 01/26/21  Ainsley Spinner, PA-C  atropine 1 % ophthalmic solution Place 1 drop into the right eye 2 (two) times daily as needed (dry eyes).  07/04/17   [provider]  BD PEN NEEDLE NANO 2ND GEN 32G X 4 MM MISC in the morning, at noon, and at bedtime.  03/03/19   [provider]  carbamide peroxide (DEBROX) 6.5 % OTIC solution Place 5 drops into the right ear 2 (two) times daily for 4 days. 01/11/21 01/15/21  Wieters, Hallie C, PA-C  darbepoetin (ARANESP) 200 MCG/0.4ML SOLN injection Inject 0.4 mLs (200 mcg total) into the vein every Wednesday with hemodialysis. 06/22/13   Geradine Girt, DO  diclofenac sodium (VOLTAREN) 1 % GEL Apply 2 g topically 4 (four) times daily as needed (pain).  04/28/19   [provider]  diphenhydrAMINE (BENADRYL) 25 MG tablet Take 25 mg by mouth See admin instructions. Take one tablet (25 mg) by mouth daily at bedtime, and take one tablet (25 mg) by mouth on Monday, Wednesday, Friday mornings before dialysis    [provider]  docusate sodium (COLACE) 100 MG capsule Take 1 capsule (100 mg total) by mouth 2 (two) times daily. 12/27/20   Mariel Aloe, MD  doxercalciferol (HECTOROL) 4 MCG/2ML injection Inject 0.5 mLs (1 mcg total) into the vein every Monday, Wednesday, and Friday with hemodialysis. 06/18/13   Geradine Girt, DO  fluticasone (FLONASE) 50 MCG/ACT nasal spray Place 1-2 sprays into both nostrils daily. 01/11/21   Wieters, Hallie C, PA-C  gabapentin (NEURONTIN) 100 MG capsule Take 100 mg by mouth in the morning.    [provider]  Hydrocortisone Acetate 1 % OINT Apply 1 application topically as needed (inflammation). 10/03/20   [provider]  HYDROmorphone (DILAUDID) 2 MG tablet Take 1 tablet (2 mg total) by mouth every 6 (six) hours as needed for moderate pain or severe pain. 12/27/20   Ainsley Spinner, PA-C  insulin aspart (NOVOLOG) 100 UNIT/ML FlexPen Inject 6-8 Units into the skin 2 (two) times daily with a meal. 6 units in the evening, 8 units in the morning    [provider]  lanthanum (FOSRENOL) 1000 MG chewable tablet Chew 2,000-4,000 mg by mouth See admin  instructions. Chew 4 tablets (4000 mg) by mouth with each meal and 2 tablets (2000 mg) with snacks  [provider]  multivitamin (RENA-VIT) TABS tablet Take 1 tablet by mouth daily. 12/20/19   [provider]  nystatin ointment (MYCOSTATIN) Apply topically 2 (two) times daily. 10/23/20   [provider]  omeprazole (PRILOSEC) 40 MG capsule Take 1 capsule (40 mg total) by mouth daily. 06/11/20   Esterwood, Amy S, PA-C  ONETOUCH VERIO test strip 1 each by Other route 3 (three) times daily.  12/15/17   [provider]  OXYGEN Inhale 3 L into the lungs continuous.    [provider]  prednisoLONE acetate (PRED FORTE) 1 % ophthalmic suspension Place 1 drop into the right eye daily. For eye pain 04/27/17   [provider]  traZODone (DESYREL) 100 MG tablet Take 100 mg by mouth at bedtime. 11/29/20   [provider]    Allergies    Tape, Latex, Oxycodone, Tramadol, Aspirin, Morphine and related, and Vicodin [hydrocodone-acetaminophen]  Review of Systems   Review of Systems  Constitutional:  Negative for chills, fatigue and fever.  HENT:  Negative for congestion, ear pain, rhinorrhea and sore throat.   Eyes:  Negative for pain and visual disturbance.  Respiratory:  Negative for cough, shortness of breath and wheezing.   Cardiovascular:  Negative for chest pain and palpitations.  Gastrointestinal:  Positive for abdominal pain (intermittent), diarrhea, nausea and vomiting.  Genitourinary:  Negative for dysuria and hematuria.  Musculoskeletal:  Negative for arthralgias and back pain.  Skin:  Negative for color change and rash.  Neurological:  Negative for seizures, loss of consciousness, syncope and headaches.  All other systems reviewed and are negative.  Physical Exam Updated Vital Signs BP (!) 103/48   Pulse 88   Temp 98 F (36.7 C)   Resp 16   LMP  (LMP Unknown)   SpO2 93%   Physical Exam Vitals and nursing note reviewed.   Constitutional:      General: She is not in acute distress.    Appearance: She is well-developed. She is not ill-appearing.  HENT:     Head: Normocephalic and atraumatic.     Nose: Nose normal.     Mouth/Throat:     Mouth: Mucous membranes are moist.     Pharynx: No oropharyngeal exudate or posterior oropharyngeal erythema.  Eyes:     Extraocular Movements: Extraocular movements intact.     Conjunctiva/sclera: Conjunctivae normal.     Pupils: Pupils are equal, round, and reactive to light.  Cardiovascular:     Rate and Rhythm: Normal rate and regular rhythm.     Heart sounds: No murmur heard. Pulmonary:     Effort: Pulmonary effort is normal. No respiratory distress.     Breath sounds: Normal breath sounds.  Abdominal:     General: There is no distension.     Palpations: Abdomen is soft.     Tenderness: There is no abdominal tenderness. There is no guarding or rebound.  Musculoskeletal:     Cervical back: Normal range of motion and neck supple.  Skin:    General: Skin is warm and dry.     Capillary Refill: Capillary refill takes less than 2 seconds.  Neurological:     General: No focal deficit present.     Mental Status: She is alert.    ED Results / Procedures / Treatments   Labs (all labs ordered are listed, but only abnormal results are displayed) Labs Reviewed  COMPREHENSIVE METABOLIC PANEL - Abnormal; Notable for the following components:      Result  Value   Potassium 2.9 (*)    Chloride 96 (*)    Creatinine, Ser 3.65 (*)    Calcium 8.0 (*)    Total Protein 6.1 (*)    Albumin 2.8 (*)    Total Bilirubin 1.9 (*)    GFR, Estimated 13 (*)    All other components within normal limits  CBC WITH DIFFERENTIAL/PLATELET - Abnormal; Notable for the following components:   RBC 2.44 (*)    Hemoglobin 8.0 (*)    HCT 25.5 (*)    MCV 104.5 (*)    RDW 21.2 (*)    Lymphs Abs 0.6 (*)    All other components within normal limits  LIPASE, BLOOD  POC OCCULT BLOOD, ED  TYPE  AND SCREEN    EKG None  Radiology No results found.  Procedures Procedures   Medications Ordered in ED Medications  ondansetron (ZOFRAN-ODT) disintegrating tablet 4 mg (4 mg Oral Given 01/14/21 1824)    ED Course  I have reviewed the triage vital signs and the nursing notes.  Pertinent labs & imaging results that were available during my care of the patient were reviewed by me and considered in my medical decision making (see chart for details).    MDM Rules/Calculators/A&P                          [name] is here from dialysis with concern for possible GI bleed.  Patient with normal vitals.  No fever.  Has had some nausea and vomiting may be some dark stools recently.  Hemoglobin was 7 supposedly at dialysis today.  She has brown stool on exam.  Hemoccult is negative.  Hemoglobin is 8.  No significant findings on lab work otherwise.  She has no abdominal tenderness on exam.  She is well-appearing and vital signs are reassuring.  No fever and no white count and doubt infectious process.  No concern for bowel obstruction or other intra-abdominal process given history and physical and lab work.  Overall given reassurance.  Given Zofran for nausea and recommend follow-up with primary care doctor.  Discharged in good condition.  This chart was dictated using voice recognition software.  Despite best efforts to proofread,  errors can occur which can change the documentation meaning.   Final Clinical Impression(s) / ED Diagnoses Final diagnoses:  Nausea  Diarrhea, unspecified type    Rx / DC Orders ED Discharge Orders          Ordered    ondansetron (ZOFRAN) 4 MG tablet  Every 6 hours        01/14/21 1926             Kshawn Canal, DO 01/14/21 [place], [place], DO 01/14/21 1928

## 2021-01-14 NOTE — ED Triage Notes (Signed)
Pt here from Dialysis treatment , finished her whole treatment , has had n/v/d for 2 weeks , Doctor at the dialysis unit wanted pt to come over to have her hgb checked for possible bleed transfusion , pt is blind

## 2021-01-16 DIAGNOSIS — D631 Anemia in chronic kidney disease: Secondary | ICD-10-CM | POA: Diagnosis not present

## 2021-01-16 DIAGNOSIS — N186 End stage renal disease: Secondary | ICD-10-CM | POA: Diagnosis not present

## 2021-01-16 DIAGNOSIS — S72141D Displaced intertrochanteric fracture of right femur, subsequent encounter for closed fracture with routine healing: Secondary | ICD-10-CM | POA: Diagnosis not present

## 2021-01-16 DIAGNOSIS — D509 Iron deficiency anemia, unspecified: Secondary | ICD-10-CM | POA: Diagnosis not present

## 2021-01-16 DIAGNOSIS — N2581 Secondary hyperparathyroidism of renal origin: Secondary | ICD-10-CM | POA: Diagnosis not present

## 2021-01-16 DIAGNOSIS — Z992 Dependence on renal dialysis: Secondary | ICD-10-CM | POA: Diagnosis not present

## 2021-01-18 DIAGNOSIS — N186 End stage renal disease: Secondary | ICD-10-CM | POA: Diagnosis not present

## 2021-01-18 DIAGNOSIS — D509 Iron deficiency anemia, unspecified: Secondary | ICD-10-CM | POA: Diagnosis not present

## 2021-01-18 DIAGNOSIS — Z992 Dependence on renal dialysis: Secondary | ICD-10-CM | POA: Diagnosis not present

## 2021-01-18 DIAGNOSIS — E1129 Type 2 diabetes mellitus with other diabetic kidney complication: Secondary | ICD-10-CM | POA: Diagnosis not present

## 2021-01-18 DIAGNOSIS — N2581 Secondary hyperparathyroidism of renal origin: Secondary | ICD-10-CM | POA: Diagnosis not present

## 2021-01-18 DIAGNOSIS — D631 Anemia in chronic kidney disease: Secondary | ICD-10-CM | POA: Diagnosis not present

## 2021-01-21 DIAGNOSIS — Z992 Dependence on renal dialysis: Secondary | ICD-10-CM | POA: Diagnosis not present

## 2021-01-21 DIAGNOSIS — N2581 Secondary hyperparathyroidism of renal origin: Secondary | ICD-10-CM | POA: Diagnosis not present

## 2021-01-21 DIAGNOSIS — D631 Anemia in chronic kidney disease: Secondary | ICD-10-CM | POA: Diagnosis not present

## 2021-01-21 DIAGNOSIS — D509 Iron deficiency anemia, unspecified: Secondary | ICD-10-CM | POA: Diagnosis not present

## 2021-01-21 DIAGNOSIS — N186 End stage renal disease: Secondary | ICD-10-CM | POA: Diagnosis not present

## 2021-01-23 DIAGNOSIS — D631 Anemia in chronic kidney disease: Secondary | ICD-10-CM | POA: Diagnosis not present

## 2021-01-23 DIAGNOSIS — Z992 Dependence on renal dialysis: Secondary | ICD-10-CM | POA: Diagnosis not present

## 2021-01-23 DIAGNOSIS — N186 End stage renal disease: Secondary | ICD-10-CM | POA: Diagnosis not present

## 2021-01-23 DIAGNOSIS — D509 Iron deficiency anemia, unspecified: Secondary | ICD-10-CM | POA: Diagnosis not present

## 2021-01-23 DIAGNOSIS — N2581 Secondary hyperparathyroidism of renal origin: Secondary | ICD-10-CM | POA: Diagnosis not present

## 2021-01-23 DIAGNOSIS — E1142 Type 2 diabetes mellitus with diabetic polyneuropathy: Secondary | ICD-10-CM | POA: Diagnosis not present

## 2021-01-25 DIAGNOSIS — Z992 Dependence on renal dialysis: Secondary | ICD-10-CM | POA: Diagnosis not present

## 2021-01-25 DIAGNOSIS — N186 End stage renal disease: Secondary | ICD-10-CM | POA: Diagnosis not present

## 2021-01-25 DIAGNOSIS — D631 Anemia in chronic kidney disease: Secondary | ICD-10-CM | POA: Diagnosis not present

## 2021-01-25 DIAGNOSIS — D509 Iron deficiency anemia, unspecified: Secondary | ICD-10-CM | POA: Diagnosis not present

## 2021-01-25 DIAGNOSIS — N2581 Secondary hyperparathyroidism of renal origin: Secondary | ICD-10-CM | POA: Diagnosis not present

## 2021-01-28 DIAGNOSIS — D509 Iron deficiency anemia, unspecified: Secondary | ICD-10-CM | POA: Diagnosis not present

## 2021-01-28 DIAGNOSIS — D631 Anemia in chronic kidney disease: Secondary | ICD-10-CM | POA: Diagnosis not present

## 2021-01-28 DIAGNOSIS — Z992 Dependence on renal dialysis: Secondary | ICD-10-CM | POA: Diagnosis not present

## 2021-01-28 DIAGNOSIS — N186 End stage renal disease: Secondary | ICD-10-CM | POA: Diagnosis not present

## 2021-01-28 DIAGNOSIS — N2581 Secondary hyperparathyroidism of renal origin: Secondary | ICD-10-CM | POA: Diagnosis not present

## 2021-01-29 DIAGNOSIS — Z89512 Acquired absence of left leg below knee: Secondary | ICD-10-CM | POA: Diagnosis not present

## 2021-01-29 DIAGNOSIS — I739 Peripheral vascular disease, unspecified: Secondary | ICD-10-CM | POA: Diagnosis not present

## 2021-01-29 DIAGNOSIS — N186 End stage renal disease: Secondary | ICD-10-CM | POA: Diagnosis not present

## 2021-01-29 DIAGNOSIS — E1129 Type 2 diabetes mellitus with other diabetic kidney complication: Secondary | ICD-10-CM | POA: Diagnosis not present

## 2021-01-29 DIAGNOSIS — Z89611 Acquired absence of right leg above knee: Secondary | ICD-10-CM | POA: Diagnosis not present

## 2021-01-29 DIAGNOSIS — D631 Anemia in chronic kidney disease: Secondary | ICD-10-CM | POA: Diagnosis not present

## 2021-01-29 DIAGNOSIS — I509 Heart failure, unspecified: Secondary | ICD-10-CM | POA: Diagnosis not present

## 2021-01-29 DIAGNOSIS — J9611 Chronic respiratory failure with hypoxia: Secondary | ICD-10-CM | POA: Diagnosis not present

## 2021-01-29 DIAGNOSIS — Z992 Dependence on renal dialysis: Secondary | ICD-10-CM | POA: Diagnosis not present

## 2021-01-30 DIAGNOSIS — D631 Anemia in chronic kidney disease: Secondary | ICD-10-CM | POA: Diagnosis not present

## 2021-01-30 DIAGNOSIS — D509 Iron deficiency anemia, unspecified: Secondary | ICD-10-CM | POA: Diagnosis not present

## 2021-01-30 DIAGNOSIS — N186 End stage renal disease: Secondary | ICD-10-CM | POA: Diagnosis not present

## 2021-01-30 DIAGNOSIS — Z992 Dependence on renal dialysis: Secondary | ICD-10-CM | POA: Diagnosis not present

## 2021-01-30 DIAGNOSIS — N2581 Secondary hyperparathyroidism of renal origin: Secondary | ICD-10-CM | POA: Diagnosis not present

## 2021-02-01 DIAGNOSIS — D509 Iron deficiency anemia, unspecified: Secondary | ICD-10-CM | POA: Diagnosis not present

## 2021-02-01 DIAGNOSIS — Z992 Dependence on renal dialysis: Secondary | ICD-10-CM | POA: Diagnosis not present

## 2021-02-01 DIAGNOSIS — N2581 Secondary hyperparathyroidism of renal origin: Secondary | ICD-10-CM | POA: Diagnosis not present

## 2021-02-01 DIAGNOSIS — N186 End stage renal disease: Secondary | ICD-10-CM | POA: Diagnosis not present

## 2021-02-01 DIAGNOSIS — D631 Anemia in chronic kidney disease: Secondary | ICD-10-CM | POA: Diagnosis not present

## 2021-02-06 ENCOUNTER — Ambulatory Visit: Payer: Self-pay | Admitting: *Deleted

## 2021-02-07 ENCOUNTER — Other Ambulatory Visit: Payer: Self-pay | Admitting: *Deleted

## 2021-02-07 NOTE — Patient Outreach (Signed)
[facility]) Care Management  02/07/2021  [name] March 11, 1951 [id number]   Telephone Assessment-Successful-Diabetes  RN spoke with pt's sister [name] reports continue to do well with no acute issues or related problems. States pt BS remains under 150 and she continues with her weekly dialysis on MWF.  Sister [name]) reports her surgeon will release her care next week due to her progress. Reports [name] is working with [employer] for pt's ongoing home O2. Verified pt is not having any ongoing issues related to her recent ED visits with hearing loss or nausea.  RN will reiterate on the care plan and verify pt continue to make progress and utilize the available resources provided. Verified pt continue to follow with discussed care plan with noted interventions with no delays. No additional needs at pt continues to do well with resources provided.  Will follow up next month  with pt's ongoing progress and update plan accordingly.    Goals Addressed             This Visit's Progress    THN-Monitor and Manage My Blood Sugar   On track    Timeframe:  Long-Range Goal Priority:  High Start Date:  05/01/20                           Expected End Date: 05/19/21                      Follow Up Date 03/08/2021   - check blood sugar at prescribed times - check blood sugar if I feel it is too high or too low - take the blood sugar log to all doctor visits - take the blood sugar meter to all doctor visits    Why is this important?   Checking your blood sugar at home helps to keep it from getting very high or very low.  Writing the results in a diary or log helps the doctor know how to care for you.  Your blood sugar log should have the time, date and the results.  Also, write down the amount of insulin or other medicine that you take.  Other information, like what you ate, exercise done and how you were feeling, will also be helpful.     Notes: Adherent to all  medical appointments Updated 09/06/20: Patient takes her blood sugar 2-3 times daily and does record the values. Nurse will send patient a calendar booklet. Patient's A1c goal is 6 and her current A1c is 5.2         [name], RN Care Management Coordinator [facility] [phone]

## 2021-02-08 DIAGNOSIS — N2581 Secondary hyperparathyroidism of renal origin: Secondary | ICD-10-CM | POA: Diagnosis not present

## 2021-02-08 DIAGNOSIS — D509 Iron deficiency anemia, unspecified: Secondary | ICD-10-CM | POA: Diagnosis not present

## 2021-02-08 DIAGNOSIS — Z992 Dependence on renal dialysis: Secondary | ICD-10-CM | POA: Diagnosis not present

## 2021-02-08 DIAGNOSIS — D631 Anemia in chronic kidney disease: Secondary | ICD-10-CM | POA: Diagnosis not present

## 2021-02-08 DIAGNOSIS — N186 End stage renal disease: Secondary | ICD-10-CM | POA: Diagnosis not present

## 2021-02-11 DIAGNOSIS — D509 Iron deficiency anemia, unspecified: Secondary | ICD-10-CM | POA: Diagnosis not present

## 2021-02-11 DIAGNOSIS — N2581 Secondary hyperparathyroidism of renal origin: Secondary | ICD-10-CM | POA: Diagnosis not present

## 2021-02-11 DIAGNOSIS — N186 End stage renal disease: Secondary | ICD-10-CM | POA: Diagnosis not present

## 2021-02-11 DIAGNOSIS — D631 Anemia in chronic kidney disease: Secondary | ICD-10-CM | POA: Diagnosis not present

## 2021-02-11 DIAGNOSIS — Z992 Dependence on renal dialysis: Secondary | ICD-10-CM | POA: Diagnosis not present

## 2021-02-13 DIAGNOSIS — N186 End stage renal disease: Secondary | ICD-10-CM | POA: Diagnosis not present

## 2021-02-13 DIAGNOSIS — D631 Anemia in chronic kidney disease: Secondary | ICD-10-CM | POA: Diagnosis not present

## 2021-02-13 DIAGNOSIS — D509 Iron deficiency anemia, unspecified: Secondary | ICD-10-CM | POA: Diagnosis not present

## 2021-02-13 DIAGNOSIS — S72141D Displaced intertrochanteric fracture of right femur, subsequent encounter for closed fracture with routine healing: Secondary | ICD-10-CM | POA: Diagnosis not present

## 2021-02-13 DIAGNOSIS — Z992 Dependence on renal dialysis: Secondary | ICD-10-CM | POA: Diagnosis not present

## 2021-02-13 DIAGNOSIS — N2581 Secondary hyperparathyroidism of renal origin: Secondary | ICD-10-CM | POA: Diagnosis not present

## 2021-02-15 DIAGNOSIS — N186 End stage renal disease: Secondary | ICD-10-CM | POA: Diagnosis not present

## 2021-02-15 DIAGNOSIS — D509 Iron deficiency anemia, unspecified: Secondary | ICD-10-CM | POA: Diagnosis not present

## 2021-02-15 DIAGNOSIS — D631 Anemia in chronic kidney disease: Secondary | ICD-10-CM | POA: Diagnosis not present

## 2021-02-15 DIAGNOSIS — N2581 Secondary hyperparathyroidism of renal origin: Secondary | ICD-10-CM | POA: Diagnosis not present

## 2021-02-15 DIAGNOSIS — Z992 Dependence on renal dialysis: Secondary | ICD-10-CM | POA: Diagnosis not present

## 2021-02-18 DIAGNOSIS — D509 Iron deficiency anemia, unspecified: Secondary | ICD-10-CM | POA: Diagnosis not present

## 2021-02-18 DIAGNOSIS — Z992 Dependence on renal dialysis: Secondary | ICD-10-CM | POA: Diagnosis not present

## 2021-02-18 DIAGNOSIS — N2581 Secondary hyperparathyroidism of renal origin: Secondary | ICD-10-CM | POA: Diagnosis not present

## 2021-02-18 DIAGNOSIS — N186 End stage renal disease: Secondary | ICD-10-CM | POA: Diagnosis not present

## 2021-02-18 DIAGNOSIS — N189 Chronic kidney disease, unspecified: Secondary | ICD-10-CM | POA: Diagnosis not present

## 2021-02-18 DIAGNOSIS — D631 Anemia in chronic kidney disease: Secondary | ICD-10-CM | POA: Diagnosis not present

## 2021-02-20 DIAGNOSIS — N186 End stage renal disease: Secondary | ICD-10-CM | POA: Diagnosis not present

## 2021-02-20 DIAGNOSIS — D631 Anemia in chronic kidney disease: Secondary | ICD-10-CM | POA: Diagnosis not present

## 2021-02-20 DIAGNOSIS — D509 Iron deficiency anemia, unspecified: Secondary | ICD-10-CM | POA: Diagnosis not present

## 2021-02-20 DIAGNOSIS — Z992 Dependence on renal dialysis: Secondary | ICD-10-CM | POA: Diagnosis not present

## 2021-02-20 DIAGNOSIS — N189 Chronic kidney disease, unspecified: Secondary | ICD-10-CM | POA: Diagnosis not present

## 2021-02-20 DIAGNOSIS — N2581 Secondary hyperparathyroidism of renal origin: Secondary | ICD-10-CM | POA: Diagnosis not present

## 2021-02-24 ENCOUNTER — Emergency Department (HOSPITAL_COMMUNITY): Payer: Medicare Other

## 2021-02-24 ENCOUNTER — Encounter (HOSPITAL_COMMUNITY): Payer: Self-pay | Admitting: Emergency Medicine

## 2021-02-24 ENCOUNTER — Other Ambulatory Visit: Payer: Self-pay

## 2021-02-24 ENCOUNTER — Emergency Department (HOSPITAL_COMMUNITY)
Admission: EM | Admit: 2021-02-24 | Discharge: 2021-02-24 | Disposition: A | Discharge status: Home / Self Care | Payer: Medicare Other | Source: Home / Self Care | Attending: Emergency Medicine | Admitting: Emergency Medicine

## 2021-02-24 DIAGNOSIS — J45909 Unspecified asthma, uncomplicated: Secondary | ICD-10-CM | POA: Insufficient documentation

## 2021-02-24 DIAGNOSIS — I132 Hypertensive heart and chronic kidney disease with heart failure and with stage 5 chronic kidney disease, or end stage renal disease: Secondary | ICD-10-CM | POA: Insufficient documentation

## 2021-02-24 DIAGNOSIS — I120 Hypertensive chronic kidney disease with stage 5 chronic kidney disease or end stage renal disease: Secondary | ICD-10-CM | POA: Diagnosis not present

## 2021-02-24 DIAGNOSIS — Z7951 Long term (current) use of inhaled steroids: Secondary | ICD-10-CM | POA: Insufficient documentation

## 2021-02-24 DIAGNOSIS — Z91040 Latex allergy status: Secondary | ICD-10-CM | POA: Insufficient documentation

## 2021-02-24 DIAGNOSIS — I517 Cardiomegaly: Secondary | ICD-10-CM | POA: Diagnosis not present

## 2021-02-24 DIAGNOSIS — Z992 Dependence on renal dialysis: Secondary | ICD-10-CM

## 2021-02-24 DIAGNOSIS — E1142 Type 2 diabetes mellitus with diabetic polyneuropathy: Secondary | ICD-10-CM | POA: Insufficient documentation

## 2021-02-24 DIAGNOSIS — R9431 Abnormal electrocardiogram [ECG] [EKG]: Secondary | ICD-10-CM | POA: Diagnosis not present

## 2021-02-24 DIAGNOSIS — Z794 Long term (current) use of insulin: Secondary | ICD-10-CM | POA: Insufficient documentation

## 2021-02-24 DIAGNOSIS — R197 Diarrhea, unspecified: Secondary | ICD-10-CM

## 2021-02-24 DIAGNOSIS — Z85038 Personal history of other malignant neoplasm of large intestine: Secondary | ICD-10-CM | POA: Insufficient documentation

## 2021-02-24 DIAGNOSIS — E1122 Type 2 diabetes mellitus with diabetic chronic kidney disease: Secondary | ICD-10-CM | POA: Insufficient documentation

## 2021-02-24 DIAGNOSIS — Z853 Personal history of malignant neoplasm of breast: Secondary | ICD-10-CM | POA: Insufficient documentation

## 2021-02-24 DIAGNOSIS — I5042 Chronic combined systolic (congestive) and diastolic (congestive) heart failure: Secondary | ICD-10-CM | POA: Insufficient documentation

## 2021-02-24 DIAGNOSIS — N186 End stage renal disease: Secondary | ICD-10-CM | POA: Diagnosis not present

## 2021-02-24 DIAGNOSIS — J811 Chronic pulmonary edema: Secondary | ICD-10-CM

## 2021-02-24 DIAGNOSIS — N250 Renal osteodystrophy: Secondary | ICD-10-CM | POA: Diagnosis not present

## 2021-02-24 DIAGNOSIS — R0902 Hypoxemia: Secondary | ICD-10-CM | POA: Diagnosis not present

## 2021-02-24 DIAGNOSIS — N3289 Other specified disorders of bladder: Secondary | ICD-10-CM | POA: Diagnosis not present

## 2021-02-24 DIAGNOSIS — N261 Atrophy of kidney (terminal): Secondary | ICD-10-CM | POA: Diagnosis not present

## 2021-02-24 LAB — COMPREHENSIVE METABOLIC PANEL
ALT: 28 U/L (ref 0–44)
AST: 34 U/L (ref 15–41)
Albumin: 2.5 g/dL — ABNORMAL LOW (ref 3.5–5.0)
Alkaline Phosphatase: 90 U/L (ref 38–126)
Anion gap: 15 (ref 5–15)
BUN: 26 mg/dL — ABNORMAL HIGH (ref 8–23)
CO2: 27 mmol/L (ref 22–32)
Calcium: 9 mg/dL (ref 8.9–10.3)
Chloride: 95 mmol/L — ABNORMAL LOW (ref 98–111)
Creatinine, Ser: 7.82 mg/dL — ABNORMAL HIGH (ref 0.44–1.00)
GFR, Estimated: 5 mL/min — ABNORMAL LOW (ref >60)
Glucose, Bld: 119 mg/dL — ABNORMAL HIGH (ref 70–99)
Potassium: 3.2 mmol/L — ABNORMAL LOW (ref 3.5–5.1)
Sodium: 137 mmol/L (ref 135–145)
Total Bilirubin: 3.8 mg/dL — ABNORMAL HIGH (ref 0.3–1.2)
Total Protein: 5.5 g/dL — ABNORMAL LOW (ref 6.5–8.1)

## 2021-02-24 LAB — CBC WITH DIFFERENTIAL/PLATELET
Abs Immature Granulocytes: 0.03 10*3/uL (ref 0.00–0.07)
Basophils Absolute: 0 10*3/uL (ref 0.0–0.1)
Basophils Relative: 0 %
Eosinophils Absolute: 0.1 10*3/uL (ref 0.0–0.5)
Eosinophils Relative: 1 %
HCT: 28.9 % — ABNORMAL LOW (ref 36.0–46.0)
Hemoglobin: 9.2 g/dL — ABNORMAL LOW (ref 12.0–15.0)
Immature Granulocytes: 0 %
Lymphocytes Relative: 10 %
Lymphs Abs: 0.8 10*3/uL (ref 0.7–4.0)
MCH: 32.5 pg (ref 26.0–34.0)
MCHC: 31.8 g/dL (ref 30.0–36.0)
MCV: 102.1 fL — ABNORMAL HIGH (ref 80.0–100.0)
Monocytes Absolute: 0.4 10*3/uL (ref 0.1–1.0)
Monocytes Relative: 6 %
Neutro Abs: 6.7 10*3/uL (ref 1.7–7.7)
Neutrophils Relative %: 83 %
Platelets: 95 10*3/uL — ABNORMAL LOW (ref 150–400)
RBC: 2.83 MIL/uL — ABNORMAL LOW (ref 3.87–5.11)
RDW: 21.1 % — ABNORMAL HIGH (ref 11.5–15.5)
WBC: 8 10*3/uL (ref 4.0–10.5)
nRBC: 0.4 % — ABNORMAL HIGH (ref 0.0–0.2)

## 2021-02-24 LAB — MAGNESIUM: Magnesium: 2 mg/dL (ref 1.7–2.4)

## 2021-02-24 LAB — LACTIC ACID, PLASMA: Lactic Acid, Venous: 2.1 mmol/L (ref 0.5–1.9)

## 2021-02-24 MED ORDER — SODIUM CHLORIDE 0.9 % IV BOLUS
500.0000 mL | Freq: Once | INTRAVENOUS | Status: AC
  Administered 2021-02-24: 500 mL via INTRAVENOUS

## 2021-02-24 NOTE — Discharge Instructions (Addendum)
Please stay hydrated.  Please go to dialysis tomorrow since she missed her dialysis  See your doctor for follow-up  Return to ER if you have trouble breathing, vomiting, dehydration, fever

## 2021-02-24 NOTE — ED Provider Notes (Signed)
[facility] EMERGENCY DEPARTMENT Provider Note   CSN: [id number] Arrival date & time: 02/24/21  1441     History Chief Complaint  Patient presents with   Diarrhea    [name] is a 70 y.o. female hx of CHF, ESRD on dialysis, here presenting with diarrhea.  Patient's last dialysis was 4 days ago.  Patient had diarrhea for the last 2 days so didn't go to dialysis.  Patient apparently had 10 episodes of watery diarrhea yesterday.  Patient was not recently on antibiotics and has no history of C. Difficile.  Denies any sick contacts or fevers  The history is provided by the patient.      Past Medical History:  Diagnosis Date   Anemia    Anemia    Anxiety    Arthritis    "joints" (06/15/2013)   Asthma    Blind in both eyes    caused by glaucoma   Blood transfusion without reported diagnosis    Breast cancer ([place])    left   CHF (congestive heart failure) ([place])    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    Esophageal ulcer with bleeding    ESRD (end stage renal disease) ([place])    "suppose to start dialysis today" (06/15/2013)   Family history of adverse reaction to anesthesia    It took a while for pt sister to wake from anesthesia   GERD (gastroesophageal reflux disease)    GI bleed    Glaucoma    blind in both eyes   Heart murmur    Mild AS, moderate MR, moderate TR 10/30/16 echo   Hx of adenomatous colonic polyps 04/07/2018   Hypertension    Myalgia 12/31/2011   Neuropathy 12/31/2011   PAD (peripheral artery disease) ([place])    nonviable tissue left lower extremity   Shortness of breath    "when she doesn't go to dialysis"   Type II diabetes mellitus ([place])    Type II    Patient Active Problem List   Diagnosis Date Noted   Closed right hip fracture ([place]) 12/22/2020   Benign neoplasm of colon    GI bleed 05/23/2020   Upper GI bleed 05/22/2020   Angiodysplasia of intestine with hemorrhage 01/24/2020   Acute blood loss anemia  12/16/2019   Hypotension 12/16/2019   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[place]) 11/17/2019   Infection of amputation stump, left lower extremity ([place]) 10/19/2019   Peripheral artery disease ([place]) 09/30/2019   Gangrene of foot ([place]) 08/30/2019   Other fatigue 02/28/2019   Finger pain, left 02/03/2019   Trigger ring finger of left hand 02/03/2019   Osteomyelitis, unspecified ([place]) 11/16/2018   Diabetic retinopathy associated with type 2 diabetes mellitus ([place]) 11/15/2018   Rheumatoid factor positive 11/15/2018   Anemia in ESRD (end-stage renal disease) ([place]) 05/06/2018   Asthma 05/06/2018   Hx of adenomatous colonic polyps 04/07/2018   Status post carpal tunnel release 12/10/2017   Carpal tunnel syndrome, left upper limb 11/10/2017   Carpal tunnel syndrome, right upper limb 11/10/2017   Bilateral hand numbness 09/28/2017   Dependence on renal dialysis ([place]) 09/11/2017   Symptomatic anemia 07/06/2017   Hypertension 07/06/2017   Chronic combined systolic and diastolic CHF (congestive heart failure) ([place]) 11/94/1740   Complicated migraine 81/44/8185   ESRD on dialysis ([place]) 06/04/2017   Blindness of both eyes 06/04/2017   Glaucoma 06/04/2017   Essential hypertension 10/27/2016   GERD (gastroesophageal reflux disease) 10/27/2016  Hypercalcemia 05/28/2015   Abnormal stress test 03/15/2015   Poor venous access 02/08/2015   Diarrhea, unspecified 11/02/2014   Fever, unspecified 11/02/2014   Pain, unspecified 11/02/2014   Pruritus, unspecified 11/02/2014   Shortness of breath 07/28/2014   Acquired absence of eye 03/31/2014   Coagulation defect, unspecified ([place]) 03/22/2014   Dysuria 02/14/2014   Encounter for immunization 01/03/2014   Iron deficiency anemia, unspecified 10/18/2013   Unspecified protein-calorie malnutrition ([place]) 19/37/9024   Complication of vascular dialysis catheter 06/21/2013   Fibromyalgia 06/21/2013   Personal history of breast cancer 06/21/2013    Personal history of other diseases of the musculoskeletal system and connective tissue 06/21/2013   Type 2 diabetes mellitus with diabetic polyneuropathy ([place]) 06/21/2013   Secondary hyperparathyroidism of renal origin ([place]) 06/20/2013   Unspecified complication of cardiac and vascular prosthetic device, implant and graft, subsequent encounter 06/20/2013   Type II diabetes mellitus with renal manifestations ([place]) 06/15/2013   Neuropathy 12/31/2011   Breast cancer of upper-inner quadrant of left female breast ([place]) 06/27/2011    Past Surgical History:  Procedure Laterality Date   A/V FISTULAGRAM N/A 09/21/2018   Procedure: A/V FISTULAGRAM - Right Upper;  Surgeon: [name], MD;  Location: [facility];  Service: Cardiovascular;  Laterality: N/A;   ABDOMINAL AORTOGRAM N/A 11/30/2018   Procedure: ABDOMINAL AORTOGRAM;  Surgeon: [name], MD;  Location: [facility];  Service: Cardiovascular;  Laterality: N/A;   ABDOMINAL AORTOGRAM W/LOWER EXTREMITY Left 07/29/2019   Procedure: ABDOMINAL AORTOGRAM W/LOWER EXTREMITY;  Surgeon: [name], MD;  Location: [facility];  Service: Cardiovascular;  Laterality: Left;   ABDOMINAL HYSTERECTOMY     partial   AMPUTATION Left 08/30/2019   Procedure: AMPUTATION BELOW KNEE LEFT;  Surgeon: [name], MD;  Location: [facility];  Service: Vascular;  Laterality: Left;   AMPUTATION Left 10/21/2019   Procedure: Amputation Above Knee;  Surgeon: Rosetta Posner, MD;  Location: [facility];  Service: Vascular;  Laterality: Left;   AMPUTATION Right 11/17/2019   Procedure: AMPUTATION ABOVE KNEE, right leg;  Surgeon: Rosetta Posner, MD;  Location: [facility];  Service: Vascular;  Laterality: Right;   AV FISTULA PLACEMENT Right 06/06/2013   Procedure: ARTERIOVENOUS (AV) FISTULA CREATION-RIGHT BRACHIAL CEPHALIC;  Surgeon: Conrad Norcross, MD;  Location: [facility];  Service: Vascular;  Laterality: Right;   BREAST BIOPSY Left    BREAST LUMPECTOMY Left    "and  took out some lymph nodes" (06/15/2013)   CARDIAC CATHETERIZATION     04/02/15 [facility]): no angiographic CAD, LVEF 40% with global hypokinesis (LHC done for + stress echo, EF 45% 11/28/14)   CARPAL TUNNEL RELEASE Left 11/26/2017   Procedure: LEFT CARPAL TUNNEL RELEASE;  Surgeon: [name], MD;  Location: [facility];  Service: Orthopedics;  Laterality: Left;   CATARACT EXTRACTION W/ ANTERIOR VITRECTOMY Bilateral    CESAREAN SECTION  1980   COLONOSCOPY     COLONOSCOPY WITH PROPOFOL N/A 05/25/2020   Procedure: COLONOSCOPY WITH PROPOFOL;  Surgeon: Yetta Flock, MD;  Location: [facility];  Service: Gastroenterology;  Laterality: N/A;   ENTEROSCOPY N/A 12/18/2019   Procedure: ENTEROSCOPY;  Surgeon: Rush Landmark Telford Nab., MD;  Location: [facility];  Service: Gastroenterology;  Laterality: N/A;   ENTEROSCOPY N/A 05/24/2020   Procedure: ENTEROSCOPY;  Surgeon: Jackquline Denmark, MD;  Location: [facility] ENDOSCOPY;  Service: Endoscopy;  Laterality: N/A;   ESOPHAGOGASTRODUODENOSCOPY N/A 07/08/2017   Procedure: ESOPHAGOGASTRODUODENOSCOPY (EGD);  Surgeon: [name], MD;  Location: [facility];  Service: Endoscopy;  Laterality: N/A;   ESOPHAGOGASTRODUODENOSCOPY (EGD) WITH PROPOFOL N/A 02/07/2018   Procedure: ESOPHAGOGASTRODUODENOSCOPY (EGD) WITH PROPOFOL;  Surgeon: [name], MD;  Location: [facility] ENDOSCOPY;  Service: Gastroenterology;  Laterality: N/A;  APC and clips placed   EYE SURGERY Bilateral    laser surgery   GIVENS CAPSULE STUDY N/A 12/19/2019   Procedure: GIVENS CAPSULE STUDY;  Surgeon: Irving Copas., MD;  Location: [facility];  Service: Gastroenterology;  Laterality: N/A;   HOT HEMOSTASIS N/A 12/18/2019   Procedure: HOT HEMOSTASIS (ARGON PLASMA COAGULATION/BICAP);  Surgeon: Irving Copas., MD;  Location: [facility];  Service: Gastroenterology;  Laterality: N/A;   HOT HEMOSTASIS Right 05/24/2020   Procedure: HOT HEMOSTASIS (ARGON PLASMA COAGULATION/BICAP);  Surgeon:  Jackquline Denmark, MD;  Location: [facility] ENDOSCOPY;  Service: Endoscopy;  Laterality: Right;  r colon   INTRAMEDULLARY (IM) NAIL INTERTROCHANTERIC Right 12/23/2020   Procedure: INTRAMEDULLARY (IM) AFFIXUS NAIL INTERTROCHANTRIC;  Surgeon: Altamese Mulhall, MD;  Location: [facility];  Service: Orthopedics;  Laterality: Right;   LOWER EXTREMITY ANGIOGRAPHY Bilateral 11/30/2018   Procedure: LOWER EXTREMITY ANGIOGRAPHY;  Surgeon: [name], MD;  Location: [facility];  Service: Cardiovascular;  Laterality: Bilateral;   PARS PLANA VITRECTOMY Right 05/05/2017   Procedure: PARS PLANA VITRECTOMY WITH 25 GAUGE; PARTIAL REMOVAL OF OIL; INFERIOR PERIPHERAL IRIDECTOMY, REFORM ANTERIOR CHAMBER RIGHT EYE;  Surgeon: [name], MD;  Location: [facility];  Service: Ophthalmology;  Laterality: Right;   PERIPHERAL VASCULAR BALLOON ANGIOPLASTY Right 09/21/2018   Procedure: PERIPHERAL VASCULAR BALLOON ANGIOPLASTY;  Surgeon: [name], MD;  Location: [facility];  Service: Cardiovascular;  Laterality: Right;  AV fistula   PERIPHERAL VASCULAR BALLOON ANGIOPLASTY Left 11/30/2018   Procedure: PERIPHERAL VASCULAR BALLOON ANGIOPLASTY;  Surgeon: [name], MD;  Location: [facility];  Service: Cardiovascular;  Laterality: Left;  Left Anterior Tibial Artery   POLYPECTOMY  05/25/2020   Procedure: POLYPECTOMY;  Surgeon: Yetta Flock, MD;  Location: [facility];  Service: Gastroenterology;;   REFRACTIVE SURGERY Bilateral    REMOVAL OF A DIALYSIS CATHETER Right 06/06/2013   Procedure: REMOVAL OF RIGHT MEDIPORT;  Surgeon: Conrad [place], MD;  Location: [facility];  Service: Vascular;  Laterality: Right;   SUBMUCOSAL TATTOO INJECTION  12/18/2019   Procedure: SUBMUCOSAL TATTOO INJECTION;  Surgeon: Irving Copas., MD;  Location: [facility];  Service: Gastroenterology;;   TONSILLECTOMY     UPPER GASTROINTESTINAL ENDOSCOPY     WOUND DEBRIDEMENT Left 09/30/2019   Procedure: IRRIGATION AND DEBRIDEMENT  WOUND OF LEFT BELOW KNEE AMPUTATION;  Surgeon: [name], MD;  Location: [facility];  Service: Vascular;  Laterality: Left;     OB History   No obstetric history on file.     Family History  Problem Relation Age of Onset   Diabetes Mother    Hyperlipidemia Mother    Hypertension Mother    Hypertension Father    Diabetes Sister    Diabetes Brother    Hypertension Brother    Heart attack Brother    Kidney disease Brother    Colon cancer Neg Hx    Colon polyps Neg Hx    Esophageal cancer Neg Hx    Gallbladder disease Neg Hx    Rectal cancer Neg Hx    Stomach cancer Neg Hx     Social History   Tobacco Use   Smoking status: Never   Smokeless tobacco: Never  Vaping Use   Vaping Use:  Never used  Substance Use Topics   Alcohol use: No   Drug use: No    Home Medications Prior to Admission medications   Medication Sig Start Date End Date Taking? Authorizing Provider  albuterol (VENTOLIN HFA) 108 (90 Base) MCG/ACT inhaler Inhale 2 puffs into the lungs every 6 (six) hours as needed for wheezing or shortness of breath.     [provider]  apixaban (ELIQUIS) 2.5 MG TABS tablet Take 1 tablet (2.5 mg total) by mouth 2 (two) times daily. 12/27/20 01/26/21  Ainsley Spinner, PA-C  atropine 1 % ophthalmic solution Place 1 drop into the right eye 2 (two) times daily as needed (dry eyes).  07/04/17   [provider]  BD PEN NEEDLE NANO 2ND GEN 32G X 4 MM MISC in the morning, at noon, and at bedtime.  03/03/19   [provider]  darbepoetin (ARANESP) 200 MCG/0.4ML SOLN injection Inject 0.4 mLs (200 mcg total) into the vein every Wednesday with hemodialysis. 06/22/13   Geradine Girt, DO  diclofenac sodium (VOLTAREN) 1 % GEL Apply 2 g topically 4 (four) times daily as needed (pain).  04/28/19   [provider]  diphenhydrAMINE (BENADRYL) 25 MG tablet Take 25 mg by mouth See admin instructions. Take one tablet (25 mg) by mouth daily at bedtime, and take one tablet  (25 mg) by mouth on Monday, Wednesday, Friday mornings before dialysis    [provider]  docusate sodium (COLACE) 100 MG capsule Take 1 capsule (100 mg total) by mouth 2 (two) times daily. 12/27/20   Mariel Aloe, MD  doxercalciferol (HECTOROL) 4 MCG/2ML injection Inject 0.5 mLs (1 mcg total) into the vein every Monday, Wednesday, and Friday with hemodialysis. 06/18/13   Geradine Girt, DO  fluticasone (FLONASE) 50 MCG/ACT nasal spray Place 1-2 sprays into both nostrils daily. 01/11/21   Wieters, Hallie C, PA-C  gabapentin (NEURONTIN) 100 MG capsule Take 100 mg by mouth in the morning.    [provider]  Hydrocortisone Acetate 1 % OINT Apply 1 application topically as needed (inflammation). 10/03/20   [provider]  HYDROmorphone (DILAUDID) 2 MG tablet Take 1 tablet (2 mg total) by mouth every 6 (six) hours as needed for moderate pain or severe pain. 12/27/20   Ainsley Spinner, PA-C  insulin aspart (NOVOLOG) 100 UNIT/ML FlexPen Inject 6-8 Units into the skin 2 (two) times daily with a meal. 6 units in the evening, 8 units in the morning    [provider]  lanthanum (FOSRENOL) 1000 MG chewable tablet Chew 2,000-4,000 mg by mouth See admin instructions. Chew 4 tablets (4000 mg) by mouth with each meal and 2 tablets (2000 mg) with snacks    [provider]  multivitamin (RENA-VIT) TABS tablet Take 1 tablet by mouth daily. 12/20/19   [provider]  nystatin ointment (MYCOSTATIN) Apply topically 2 (two) times daily. 10/23/20   [provider]  omeprazole (PRILOSEC) 40 MG capsule Take 1 capsule (40 mg total) by mouth daily. 06/11/20   Esterwood, Amy S, PA-C  ondansetron (ZOFRAN) 4 MG tablet Take 1 tablet (4 mg total) by mouth every 6 (six) hours. 01/14/21   Curatolo, Adam, DO  ONETOUCH VERIO test strip 1 each by Other route 3 (three) times daily.  12/15/17   [provider]  OXYGEN Inhale 3 L into the lungs continuous.    [provider]  prednisoLONE acetate (PRED FORTE) 1 % ophthalmic suspension Place 1 drop into the right  eye daily. For eye pain 04/27/17   [provider]  traZODone (DESYREL) 100 MG tablet Take 100 mg by mouth at bedtime. 11/29/20   [provider]    Allergies    Tape, Latex, Oxycodone, Tramadol, Aspirin, Morphine and related, and Vicodin [hydrocodone-acetaminophen]  Review of Systems   Review of Systems  Gastrointestinal:  Positive for diarrhea.  All other systems reviewed and are negative.  Physical Exam Updated Vital Signs BP (!) 87/30 (BP Location: Left Wrist)   Pulse 82   Temp 98.6 F (37 C) (Oral)   Resp 16   LMP  (LMP Unknown)   SpO2 99%   Physical Exam Vitals and nursing note reviewed.  Constitutional:      Appearance: Normal appearance.  HENT:     Head: Normocephalic.     Nose: Nose normal.     Mouth/Throat:     Mouth: Mucous membranes are dry.  Eyes:     Extraocular Movements: Extraocular movements intact.     Pupils: Pupils are equal, round, and reactive to light.  Cardiovascular:     Rate and Rhythm: Normal rate and regular rhythm.     Pulses: Normal pulses.     Heart sounds: Normal heart sounds.  Pulmonary:     Effort: Pulmonary effort is normal.     Breath sounds: Normal breath sounds.  Abdominal:     General: Abdomen is flat.     Palpations: Abdomen is soft.     Comments: + epigastric   Musculoskeletal:        General: Normal range of motion.     Cervical back: Normal range of motion and neck supple.     Comments: Bilateral AKA   Skin:    General: Skin is warm.     Capillary Refill: Capillary refill takes less than 2 seconds.  Neurological:     General: No focal deficit present.     Mental Status: She is alert and oriented to person, place, and time.  Psychiatric:        Mood and Affect: Mood normal.        Behavior: Behavior normal.    ED Results / Procedures / Treatments   Labs (all labs ordered are listed, but only  abnormal results are displayed) Labs Reviewed  COMPREHENSIVE METABOLIC PANEL - Abnormal; Notable for the following components:      Result Value   Potassium 3.2 (*)    Chloride 95 (*)    Glucose, Bld 119 (*)    BUN 26 (*)    Creatinine, Ser 7.82 (*)    Total Protein 5.5 (*)    Albumin 2.5 (*)    Total Bilirubin 3.8 (*)    GFR, Estimated 5 (*)    All other components within normal limits  LACTIC ACID, PLASMA - Abnormal; Notable for the following components:   Lactic Acid, Venous 2.1 (*)    All other components within normal limits  CBC WITH DIFFERENTIAL/PLATELET - Abnormal; Notable for the following components:   RBC 2.83 (*)    Hemoglobin 9.2 (*)    HCT 28.9 (*)    MCV 102.1 (*)    RDW 21.1 (*)    Platelets 95 (*)    nRBC 0.4 (*)    All other components within normal limits  C DIFFICILE QUICK SCREEN W PCR REFLEX    GASTROINTESTINAL PANEL BY PCR, STOOL (REPLACES STOOL CULTURE)  MAGNESIUM  LACTIC ACID, PLASMA  URINALYSIS, ROUTINE W REFLEX MICROSCOPIC    EKG  EKG Interpretation  Date/Time:  Sunday February 24 2021 15:48:09 EDT Ventricular Rate:  80 PR Interval:  250 QRS Duration: 100 QT Interval:  414 QTC Calculation: 477 R Axis:   6 Text Interpretation: Sinus rhythm with 1st degree A-V block Nonspecific ST and T wave abnormality Prolonged QT Abnormal ECG No significant change since last tracing Confirmed by Wandra Arthurs [phone]) on 02/24/2021 4:28:07 PM  Radiology CT ABDOMEN PELVIS WO CONTRAST  Result Date: 02/24/2021 CLINICAL DATA:  Abdominal distension, diarrhea, occasional abdominal pain, dialysis, missed dialysis Friday EXAM: CT ABDOMEN AND PELVIS WITHOUT CONTRAST TECHNIQUE: Multidetector CT imaging of the abdomen and pelvis was performed following the standard protocol without IV contrast. COMPARISON:  11/22/2020 FINDINGS: Lower chest: Cardiomegaly. Extensive 3 vessel coronary artery calcifications and mitral annulus calcifications. Aortic valve calcifications versus  surgical prosthesis, not well imaged. Small bilateral pleural effusions and associated atelectasis or consolidation. Interlobular septal thickening and ground-glass airspace opacity throughout the lung bases. Hepatobiliary: No solid liver abnormality is seen. No gallstones, gallbladder wall thickening, or biliary dilatation. Pancreas: Unremarkable. No pancreatic ductal dilatation or surrounding inflammatory changes. Spleen: Normal in size without significant abnormality. Adrenals/Urinary Tract: Adrenal glands are unremarkable. Atrophic kidneys. No hydronephrosis. Thickening of the decompressed urinary bladder. Stomach/Bowel: Stomach is within normal limits. Appendix is not clearly visualized. No evidence of bowel wall thickening, distention, or inflammatory changes. Vascular/Lymphatic: Aortic atherosclerosis and pipe like vascular calcinosis. No enlarged abdominal or pelvic lymph nodes. Reproductive: Status post hysterectomy. Other: No abdominal wall hernia or abnormality. Small volume ascites throughout the abdomen and pelvis. Musculoskeletal: No acute or significant osseous findings. Renal osteodystrophy. IMPRESSION: 1. No specific non-contrast CT findings of the abdomen or pelvis to explain abdominal pain or diarrhea. 2. Small bilateral pleural effusions and associated atelectasis or consolidation. Interlobular septal thickening and ground-glass airspace opacity throughout the lung bases, most consistent with pulmonary edema. 3. Small volume ascites throughout the abdomen and pelvis. 4. Cardiomegaly and coronary artery disease. Aortic Atherosclerosis (ICD10-I70.0). Electronically Signed   By: Eddie Candle M.D.   On: 02/24/2021 17:48   DG Chest Port 1 View  Result Date: 02/24/2021 CLINICAL DATA:  Hypoxia EXAM: PORTABLE CHEST 1 VIEW COMPARISON:  12/16/2019 FINDINGS: Cardiomegaly. Worsening bilateral perihilar and lower lobe airspace disease, likely edema. No effusions. No acute bony abnormality. Diffuse aortic  calcifications. No visible aneurysm. IMPRESSION: Worsening bilateral airspace disease compatible with edema/CHF. Electronically Signed   By: Rolm Baptise M.D.   On: 02/24/2021 19:14    Procedures Procedures   Angiocath insertion Performed by: Wandra Arthurs  Consent: Verbal consent obtained. Risks and benefits: risks, benefits and alternatives were discussed Time out: Immediately prior to procedure a "time out" was called to verify the correct patient, procedure, equipment, support staff and site/side marked as required.  Preparation: Patient was prepped and draped in the usual sterile fashion.  Vein Location: L antecube   Ultrasound Guided  Gauge: 20 long   Normal blood return and flush without difficulty Patient tolerance: Patient tolerated the procedure well with no immediate complications.    Medications Ordered in ED Medications  sodium chloride 0.9 % bolus 500 mL (500 mLs Intravenous New Bag/Given 02/24/21 1658)    ED Course  I have reviewed the triage vital signs and the nursing notes.  Pertinent labs & imaging results that were available during my care of the patient were reviewed by me and considered in my medical decision making (see chart for details).    MDM Rules/Calculators/A&P  TUWANNA KRAUSZ is a 70 y.o. female here presenting with diarrhea.  Patient appears dehydrated.  Blood pressure in the 80s.  Patient did not go to dialysis 2 days ago.  Will check electrolytes and hydrate patient we will also get CT abdomen pelvis  7:28 PM Patient's labs showed potassium of 3.2 and creatinine of 7.8.  CT showed no obvious colitis.  Patient's chest x-ray showed some pulmonary edema.  Patient is on 3 L nasal cannula as needed.  Her oxygen is 100% on 3 L.  She has no diarrhea in the ED.  Order C. difficile and GI pathogen panel.  I think likely viral in etiology.  Since she has no history of C. difficile and not recently on antibiotics, will hold off on  treatment for C. Difficile.  Told her that she will need dialysis tomorrow   Final Clinical Impression(s) / ED Diagnoses Final diagnoses:  None    Rx / DC Orders ED Discharge Orders     None        Drenda Freeze, MD 02/24/21 1931

## 2021-02-24 NOTE — ED Triage Notes (Signed)
Pt here with daughter, who reports pt has been having diarrhea after eating x 1 week. Denies nausea/vomiting, c/o occasional abdominal pain. Dialysis pt MWF, missed Friday's treatment.

## 2021-02-24 NOTE — ED Provider Notes (Signed)
Emergency Medicine Provider Triage Evaluation Note  [name] , a 70 y.o. female  was evaluated in triage.  Pt complains of diarrhea multiple times a day for the last 2 weeks, with 10 episodes yesterday.  No melena or hematochezia.  Endorses generalized weakness, fatigue but denies any chest pain or shortness of breath.  Dialysis dependent Monday T[facility] is Friday with last dialysis session completed Wednesday.  She does not make small on her urine..  Review of Systems  Positive: Dysuria, diarrhea, weakness Negative: Chest pain, shortness of breath, palpitations  Physical Exam  BP (!) 100/30 (BP Location: Left Arm)   Pulse 83   Temp 98.6 F (37 C) (Oral)   Resp 16   LMP  (LMP Unknown)   SpO2 100%  Gen:   Awake, no distress   Resp:  Normal effort  MSK:   Moves extremities without difficulty  Other:  RRR no M/R/G.  Lungs CTA B.  Abdomen soft, nondistended, nontender.  Medical Decision Making  Medically screening exam initiated at 3:44 PM.  Appropriate orders placed.  CHRISHAWNA FARINA was informed that the remainder of the evaluation will be completed by another provider, this initial triage assessment does not replace that evaluation, and the importance of remaining in the ED until their evaluation is complete.  This chart was dictated using voice recognition software, Dragon. Despite the best efforts of this provider to proofread and correct errors, errors may still occur which can change documentation meaning.     Emeline Darling, PA-C 02/24/21 1545    Regan Lemming, MD 02/24/21 1900

## 2021-02-24 NOTE — ED Notes (Signed)
E-signature pad unavailable at time of pt discharge. This RN discussed discharge materials with pt and answered all pt questions. Pt stated understanding of discharge material. ? ?

## 2021-02-24 NOTE — ED Notes (Signed)
DC instructions reviewed with pt by Sophie-RN. Pt/family verbalized understanding. PT DC

## 2021-02-25 ENCOUNTER — Encounter (HOSPITAL_COMMUNITY): Payer: Self-pay | Admitting: *Deleted

## 2021-02-25 DIAGNOSIS — D509 Iron deficiency anemia, unspecified: Secondary | ICD-10-CM | POA: Diagnosis not present

## 2021-02-25 DIAGNOSIS — N186 End stage renal disease: Secondary | ICD-10-CM | POA: Diagnosis not present

## 2021-02-25 DIAGNOSIS — N189 Chronic kidney disease, unspecified: Secondary | ICD-10-CM | POA: Diagnosis not present

## 2021-02-25 DIAGNOSIS — S81809A Unspecified open wound, unspecified lower leg, initial encounter: Secondary | ICD-10-CM | POA: Diagnosis not present

## 2021-02-25 DIAGNOSIS — Z992 Dependence on renal dialysis: Secondary | ICD-10-CM | POA: Diagnosis not present

## 2021-02-25 DIAGNOSIS — D631 Anemia in chronic kidney disease: Secondary | ICD-10-CM | POA: Diagnosis not present

## 2021-02-25 DIAGNOSIS — N2581 Secondary hyperparathyroidism of renal origin: Secondary | ICD-10-CM | POA: Diagnosis not present

## 2021-02-26 ENCOUNTER — Emergency Department (HOSPITAL_COMMUNITY): Payer: Medicare Other

## 2021-02-26 ENCOUNTER — Encounter (HOSPITAL_COMMUNITY): Payer: Self-pay | Admitting: Critical Care Medicine

## 2021-02-26 ENCOUNTER — Inpatient Hospital Stay (HOSPITAL_COMMUNITY): Payer: Medicare Other

## 2021-02-26 ENCOUNTER — Other Ambulatory Visit: Payer: Self-pay

## 2021-02-26 ENCOUNTER — Inpatient Hospital Stay (HOSPITAL_COMMUNITY)
Admission: EM | Admit: 2021-02-26 | Discharge: 2021-03-21 | DRG: 871 | Disposition: E | Payer: Medicare Other | Attending: Critical Care Medicine | Admitting: Critical Care Medicine

## 2021-02-26 ENCOUNTER — Other Ambulatory Visit (HOSPITAL_COMMUNITY): Payer: Medicare Other

## 2021-02-26 DIAGNOSIS — Z9115 Patient's noncompliance with renal dialysis: Secondary | ICD-10-CM

## 2021-02-26 DIAGNOSIS — R17 Unspecified jaundice: Secondary | ICD-10-CM | POA: Diagnosis not present

## 2021-02-26 DIAGNOSIS — Z853 Personal history of malignant neoplasm of breast: Secondary | ICD-10-CM | POA: Diagnosis not present

## 2021-02-26 DIAGNOSIS — E1151 Type 2 diabetes mellitus with diabetic peripheral angiopathy without gangrene: Secondary | ICD-10-CM | POA: Diagnosis present

## 2021-02-26 DIAGNOSIS — Z992 Dependence on renal dialysis: Secondary | ICD-10-CM

## 2021-02-26 DIAGNOSIS — Y832 Surgical operation with anastomosis, bypass or graft as the cause of abnormal reaction of the patient, or of later complication, without mention of misadventure at the time of the procedure: Secondary | ICD-10-CM | POA: Diagnosis present

## 2021-02-26 DIAGNOSIS — Z89612 Acquired absence of left leg above knee: Secondary | ICD-10-CM

## 2021-02-26 DIAGNOSIS — T82898A Other specified complication of vascular prosthetic devices, implants and grafts, initial encounter: Secondary | ICD-10-CM | POA: Diagnosis present

## 2021-02-26 DIAGNOSIS — D631 Anemia in chronic kidney disease: Secondary | ICD-10-CM | POA: Diagnosis present

## 2021-02-26 DIAGNOSIS — I5022 Chronic systolic (congestive) heart failure: Secondary | ICD-10-CM | POA: Diagnosis present

## 2021-02-26 DIAGNOSIS — G9340 Encephalopathy, unspecified: Secondary | ICD-10-CM

## 2021-02-26 DIAGNOSIS — R4182 Altered mental status, unspecified: Secondary | ICD-10-CM | POA: Diagnosis not present

## 2021-02-26 DIAGNOSIS — D696 Thrombocytopenia, unspecified: Secondary | ICD-10-CM | POA: Diagnosis present

## 2021-02-26 DIAGNOSIS — Z20822 Contact with and (suspected) exposure to covid-19: Secondary | ICD-10-CM | POA: Diagnosis present

## 2021-02-26 DIAGNOSIS — N250 Renal osteodystrophy: Secondary | ICD-10-CM | POA: Diagnosis not present

## 2021-02-26 DIAGNOSIS — Z91040 Latex allergy status: Secondary | ICD-10-CM

## 2021-02-26 DIAGNOSIS — Z841 Family history of disorders of kidney and ureter: Secondary | ICD-10-CM

## 2021-02-26 DIAGNOSIS — I214 Non-ST elevation (NSTEMI) myocardial infarction: Secondary | ICD-10-CM | POA: Diagnosis present

## 2021-02-26 DIAGNOSIS — I517 Cardiomegaly: Secondary | ICD-10-CM | POA: Diagnosis not present

## 2021-02-26 DIAGNOSIS — D649 Anemia, unspecified: Secondary | ICD-10-CM | POA: Diagnosis not present

## 2021-02-26 DIAGNOSIS — G9341 Metabolic encephalopathy: Secondary | ICD-10-CM | POA: Diagnosis not present

## 2021-02-26 DIAGNOSIS — R7401 Elevation of levels of liver transaminase levels: Secondary | ICD-10-CM | POA: Diagnosis present

## 2021-02-26 DIAGNOSIS — E1140 Type 2 diabetes mellitus with diabetic neuropathy, unspecified: Secondary | ICD-10-CM | POA: Diagnosis present

## 2021-02-26 DIAGNOSIS — I132 Hypertensive heart and chronic kidney disease with heart failure and with stage 5 chronic kidney disease, or end stage renal disease: Secondary | ICD-10-CM | POA: Diagnosis not present

## 2021-02-26 DIAGNOSIS — R6521 Severe sepsis with septic shock: Secondary | ICD-10-CM | POA: Diagnosis not present

## 2021-02-26 DIAGNOSIS — T829XXA Unspecified complication of cardiac and vascular prosthetic device, implant and graft, initial encounter: Secondary | ICD-10-CM | POA: Diagnosis not present

## 2021-02-26 DIAGNOSIS — J45909 Unspecified asthma, uncomplicated: Secondary | ICD-10-CM | POA: Diagnosis present

## 2021-02-26 DIAGNOSIS — Z833 Family history of diabetes mellitus: Secondary | ICD-10-CM

## 2021-02-26 DIAGNOSIS — N186 End stage renal disease: Secondary | ICD-10-CM | POA: Diagnosis present

## 2021-02-26 DIAGNOSIS — Z66 Do not resuscitate: Secondary | ICD-10-CM | POA: Diagnosis not present

## 2021-02-26 DIAGNOSIS — I739 Peripheral vascular disease, unspecified: Secondary | ICD-10-CM | POA: Diagnosis not present

## 2021-02-26 DIAGNOSIS — Z888 Allergy status to other drugs, medicaments and biological substances status: Secondary | ICD-10-CM

## 2021-02-26 DIAGNOSIS — A4151 Sepsis due to Escherichia coli [E. coli]: Principal | ICD-10-CM | POA: Diagnosis present

## 2021-02-26 DIAGNOSIS — Z83438 Family history of other disorder of lipoprotein metabolism and other lipidemia: Secondary | ICD-10-CM

## 2021-02-26 DIAGNOSIS — E1122 Type 2 diabetes mellitus with diabetic chronic kidney disease: Secondary | ICD-10-CM | POA: Diagnosis present

## 2021-02-26 DIAGNOSIS — T82590A Other mechanical complication of surgically created arteriovenous fistula, initial encounter: Secondary | ICD-10-CM | POA: Diagnosis not present

## 2021-02-26 DIAGNOSIS — H548 Legal blindness, as defined in USA: Secondary | ICD-10-CM | POA: Diagnosis present

## 2021-02-26 DIAGNOSIS — Z86010 Personal history of colonic polyps: Secondary | ICD-10-CM

## 2021-02-26 DIAGNOSIS — Z89611 Acquired absence of right leg above knee: Secondary | ICD-10-CM

## 2021-02-26 DIAGNOSIS — Z886 Allergy status to analgesic agent status: Secondary | ICD-10-CM

## 2021-02-26 DIAGNOSIS — Z9981 Dependence on supplemental oxygen: Secondary | ICD-10-CM

## 2021-02-26 DIAGNOSIS — E1165 Type 2 diabetes mellitus with hyperglycemia: Secondary | ICD-10-CM | POA: Diagnosis present

## 2021-02-26 DIAGNOSIS — Z789 Other specified health status: Secondary | ICD-10-CM

## 2021-02-26 DIAGNOSIS — I120 Hypertensive chronic kidney disease with stage 5 chronic kidney disease or end stage renal disease: Secondary | ICD-10-CM | POA: Diagnosis not present

## 2021-02-26 DIAGNOSIS — R579 Shock, unspecified: Secondary | ICD-10-CM | POA: Diagnosis present

## 2021-02-26 DIAGNOSIS — I959 Hypotension, unspecified: Secondary | ICD-10-CM | POA: Diagnosis not present

## 2021-02-26 DIAGNOSIS — Z8249 Family history of ischemic heart disease and other diseases of the circulatory system: Secondary | ICD-10-CM

## 2021-02-26 DIAGNOSIS — H409 Unspecified glaucoma: Secondary | ICD-10-CM | POA: Diagnosis present

## 2021-02-26 DIAGNOSIS — E872 Acidosis: Secondary | ICD-10-CM | POA: Diagnosis present

## 2021-02-26 DIAGNOSIS — K219 Gastro-esophageal reflux disease without esophagitis: Secondary | ICD-10-CM | POA: Diagnosis present

## 2021-02-26 DIAGNOSIS — R531 Weakness: Secondary | ICD-10-CM | POA: Diagnosis not present

## 2021-02-26 DIAGNOSIS — Z885 Allergy status to narcotic agent status: Secondary | ICD-10-CM

## 2021-02-26 DIAGNOSIS — J811 Chronic pulmonary edema: Secondary | ICD-10-CM | POA: Diagnosis not present

## 2021-02-26 DIAGNOSIS — J90 Pleural effusion, not elsewhere classified: Secondary | ICD-10-CM | POA: Diagnosis not present

## 2021-02-26 LAB — I-STAT CHEM 8, ED
BUN: 38 mg/dL — ABNORMAL HIGH (ref 8–23)
Calcium, Ion: 0.92 mmol/L — ABNORMAL LOW (ref 1.15–1.40)
Chloride: 100 mmol/L (ref 98–111)
Creatinine, Ser: 9.3 mg/dL — ABNORMAL HIGH (ref 0.44–1.00)
Glucose, Bld: 84 mg/dL (ref 70–99)
HCT: 34 % — ABNORMAL LOW (ref 36.0–46.0)
Hemoglobin: 11.6 g/dL — ABNORMAL LOW (ref 12.0–15.0)
Potassium: 3.7 mmol/L (ref 3.5–5.1)
Sodium: 136 mmol/L (ref 135–145)
TCO2: 21 mmol/L — ABNORMAL LOW (ref 22–32)

## 2021-02-26 LAB — CBC WITH DIFFERENTIAL/PLATELET
Abs Immature Granulocytes: 0.32 10*3/uL — ABNORMAL HIGH (ref 0.00–0.07)
Basophils Absolute: 0 10*3/uL (ref 0.0–0.1)
Basophils Relative: 0 %
Eosinophils Absolute: 0 10*3/uL (ref 0.0–0.5)
Eosinophils Relative: 0 %
HCT: 29.9 % — ABNORMAL LOW (ref 36.0–46.0)
Hemoglobin: 9.7 g/dL — ABNORMAL LOW (ref 12.0–15.0)
Immature Granulocytes: 3 %
Lymphocytes Relative: 4 %
Lymphs Abs: 0.5 10*3/uL — ABNORMAL LOW (ref 0.7–4.0)
MCH: 33.2 pg (ref 26.0–34.0)
MCHC: 32.4 g/dL (ref 30.0–36.0)
MCV: 102.4 fL — ABNORMAL HIGH (ref 80.0–100.0)
Monocytes Absolute: 0.6 10*3/uL (ref 0.1–1.0)
Monocytes Relative: 5 %
Neutro Abs: 10.2 10*3/uL — ABNORMAL HIGH (ref 1.7–7.7)
Neutrophils Relative %: 88 %
Platelets: 67 10*3/uL — ABNORMAL LOW (ref 150–400)
RBC: 2.92 MIL/uL — ABNORMAL LOW (ref 3.87–5.11)
RDW: 22.1 % — ABNORMAL HIGH (ref 11.5–15.5)
WBC: 11.7 10*3/uL — ABNORMAL HIGH (ref 4.0–10.5)
nRBC: 0 % (ref 0.0–0.2)

## 2021-02-26 LAB — I-STAT VENOUS BLOOD GAS, ED
Acid-Base Excess: 2 mmol/L (ref 0.0–2.0)
Bicarbonate: 27.1 mmol/L (ref 20.0–28.0)
Calcium, Ion: 1.01 mmol/L — ABNORMAL LOW (ref 1.15–1.40)
HCT: 33 % — ABNORMAL LOW (ref 36.0–46.0)
Hemoglobin: 11.2 g/dL — ABNORMAL LOW (ref 12.0–15.0)
O2 Saturation: 74 %
Potassium: 3.8 mmol/L (ref 3.5–5.1)
Sodium: 139 mmol/L (ref 135–145)
TCO2: 28 mmol/L (ref 22–32)
pCO2, Ven: 41.7 mmHg — ABNORMAL LOW (ref 44.0–60.0)
pH, Ven: 7.422 (ref 7.250–7.430)
pO2, Ven: 39 mmHg (ref 32.0–45.0)

## 2021-02-26 LAB — BLOOD GAS, VENOUS
Acid-base deficit: 0.9 mmol/L (ref 0.0–2.0)
Bicarbonate: 23.7 mmol/L (ref 20.0–28.0)
Drawn by: 1390
FIO2: 36
O2 Saturation: 48.5 %
Patient temperature: 37
pCO2, Ven: 42.7 mmHg — ABNORMAL LOW (ref 44.0–60.0)
pH, Ven: 7.364 (ref 7.250–7.430)
pO2, Ven: 34.6 mmHg (ref 32.0–45.0)

## 2021-02-26 LAB — COMPREHENSIVE METABOLIC PANEL
ALT: 36 U/L (ref 0–44)
AST: 57 U/L — ABNORMAL HIGH (ref 15–41)
Albumin: 2.3 g/dL — ABNORMAL LOW (ref 3.5–5.0)
Alkaline Phosphatase: 83 U/L (ref 38–126)
Anion gap: 18 — ABNORMAL HIGH (ref 5–15)
BUN: 41 mg/dL — ABNORMAL HIGH (ref 8–23)
CO2: 24 mmol/L (ref 22–32)
Calcium: 8.4 mg/dL — ABNORMAL LOW (ref 8.9–10.3)
Chloride: 97 mmol/L — ABNORMAL LOW (ref 98–111)
Creatinine, Ser: 9.25 mg/dL — ABNORMAL HIGH (ref 0.44–1.00)
GFR, Estimated: 4 mL/min — ABNORMAL LOW (ref >60)
Glucose, Bld: 117 mg/dL — ABNORMAL HIGH (ref 70–99)
Potassium: 4.1 mmol/L (ref 3.5–5.1)
Sodium: 139 mmol/L (ref 135–145)
Total Bilirubin: 4.9 mg/dL — ABNORMAL HIGH (ref 0.3–1.2)
Total Protein: 5.6 g/dL — ABNORMAL LOW (ref 6.5–8.1)

## 2021-02-26 LAB — RESP PANEL BY RT-PCR (FLU A&B, COVID) ARPGX2
Influenza A by PCR: NEGATIVE
Influenza B by PCR: NEGATIVE
SARS Coronavirus 2 by RT PCR: NEGATIVE

## 2021-02-26 LAB — CBC
HCT: 28.8 % — ABNORMAL LOW (ref 36.0–46.0)
Hemoglobin: 9.5 g/dL — ABNORMAL LOW (ref 12.0–15.0)
MCH: 32.9 pg (ref 26.0–34.0)
MCHC: 33 g/dL (ref 30.0–36.0)
MCV: 99.7 fL (ref 80.0–100.0)
Platelets: 77 10*3/uL — ABNORMAL LOW (ref 150–400)
RBC: 2.89 MIL/uL — ABNORMAL LOW (ref 3.87–5.11)
RDW: 21 % — ABNORMAL HIGH (ref 11.5–15.5)
WBC: 13.7 10*3/uL — ABNORMAL HIGH (ref 4.0–10.5)
nRBC: 0 % (ref 0.0–0.2)

## 2021-02-26 LAB — BASIC METABOLIC PANEL
Anion gap: 23 — ABNORMAL HIGH (ref 5–15)
BUN: 39 mg/dL — ABNORMAL HIGH (ref 8–23)
CO2: 18 mmol/L — ABNORMAL LOW (ref 22–32)
Calcium: 8.7 mg/dL — ABNORMAL LOW (ref 8.9–10.3)
Chloride: 97 mmol/L — ABNORMAL LOW (ref 98–111)
Creatinine, Ser: 9.37 mg/dL — ABNORMAL HIGH (ref 0.44–1.00)
GFR, Estimated: 4 mL/min — ABNORMAL LOW (ref >60)
Glucose, Bld: 87 mg/dL (ref 70–99)
Potassium: 3.9 mmol/L (ref 3.5–5.1)
Sodium: 138 mmol/L (ref 135–145)

## 2021-02-26 LAB — BRAIN NATRIURETIC PEPTIDE: B Natriuretic Peptide: >4500 pg/mL — ABNORMAL HIGH (ref 0.0–100.0)

## 2021-02-26 LAB — CREATININE, SERUM
Creatinine, Ser: 9.45 mg/dL — ABNORMAL HIGH (ref 0.44–1.00)
GFR, Estimated: 4 mL/min — ABNORMAL LOW (ref >60)

## 2021-02-26 LAB — MRSA NEXT GEN BY PCR, NASAL: MRSA by PCR Next Gen: DETECTED — AB

## 2021-02-26 LAB — PROCALCITONIN: Procalcitonin: 29.3 ng/mL

## 2021-02-26 LAB — LACTIC ACID, PLASMA
Lactic Acid, Venous: 3.5 mmol/L (ref 0.5–1.9)
Lactic Acid, Venous: 3.8 mmol/L (ref 0.5–1.9)

## 2021-02-26 LAB — TROPONIN I (HIGH SENSITIVITY)
Troponin I (High Sensitivity): 456 ng/L (ref <18)
Troponin I (High Sensitivity): 387 ng/L (ref <18)

## 2021-02-26 LAB — GLUCOSE, CAPILLARY
Glucose-Capillary: 104 mg/dL — ABNORMAL HIGH (ref 70–99)
Glucose-Capillary: 74 mg/dL (ref 70–99)

## 2021-02-26 MED ORDER — SODIUM CHLORIDE 0.9 % IV SOLN: 1.0000 g | INTRAVENOUS | Status: DC

## 2021-02-26 MED ORDER — SODIUM CHLORIDE 0.9 % IV BOLUS
500.0000 mL | Freq: Once | INTRAVENOUS | Status: AC
  Administered 2021-02-26: 500 mL via INTRAVENOUS

## 2021-02-26 MED ORDER — DOCUSATE SODIUM 100 MG PO CAPS: 100.0000 mg | ORAL_CAPSULE | Freq: Two times a day (BID) | ORAL | Status: DC | PRN

## 2021-02-26 MED ORDER — SODIUM CHLORIDE 0.9 % IV SOLN
2.0000 g | Freq: Once | INTRAVENOUS | Status: AC
  Administered 2021-02-26: 2 g via INTRAVENOUS
  Filled 2021-02-26: qty 2

## 2021-02-26 MED ORDER — VANCOMYCIN HCL 1500 MG/300ML IV SOLN
1500.0000 mg | Freq: Once | INTRAVENOUS | Status: DC
  Filled 2021-02-26 (×2): qty 300

## 2021-02-26 MED ORDER — POLYETHYLENE GLYCOL 3350 17 G PO PACK: 17.0000 g | PACK | Freq: Every day | ORAL | Status: DC | PRN

## 2021-02-26 MED ORDER — HEPARIN SODIUM (PORCINE) 1000 UNIT/ML IJ SOLN: 2.8000 mL | Freq: Once | INTRAMUSCULAR | Status: DC

## 2021-02-26 MED ORDER — SODIUM CHLORIDE 0.9 % IV SOLN
500.0000 mg | INTRAVENOUS | Status: DC
  Administered 2021-02-26: 500 mg via INTRAVENOUS
  Filled 2021-02-26: qty 500

## 2021-02-26 MED ORDER — NOREPINEPHRINE 4 MG/250ML-% IV SOLN
0.0000 ug/min | INTRAVENOUS | Status: DC
Start: 2021-02-26 — End: 2021-02-26
  Administered 2021-02-26: 2 ug/min via INTRAVENOUS
  Filled 2021-02-26: qty 250

## 2021-02-26 MED ORDER — PIPERACILLIN-TAZOBACTAM 3.375 G IVPB 30 MIN
3.3750 g | Freq: Once | INTRAVENOUS | Status: AC
  Administered 2021-02-26: 3.375 g via INTRAVENOUS
  Filled 2021-02-26: qty 50

## 2021-02-26 MED ORDER — HEPARIN SODIUM (PORCINE) 1000 UNIT/ML DIALYSIS
1500.0000 [IU] | INTRAMUSCULAR | Status: DC | PRN
  Filled 2021-02-26: qty 2

## 2021-02-26 MED ORDER — SODIUM CHLORIDE 0.9 % IV SOLN
0.3000 ug/kg | Freq: Once | INTRAVENOUS | Status: AC
  Administered 2021-02-26: 17.2 ug via INTRAVENOUS
  Filled 2021-02-26: qty 4.3

## 2021-02-26 MED ORDER — VANCOMYCIN VARIABLE DOSE PER UNSTABLE RENAL FUNCTION (PHARMACIST DOSING): Status: DC

## 2021-02-26 MED ORDER — HEPARIN SODIUM (PORCINE) 1000 UNIT/ML DIALYSIS
1000.0000 [IU] | INTRAMUSCULAR | Status: DC | PRN
  Administered 2021-02-26: 3000 [IU] via INTRAVENOUS_CENTRAL
  Filled 2021-02-26 (×2): qty 6
  Filled 2021-02-26: qty 3

## 2021-02-26 MED ORDER — NOREPINEPHRINE 16 MG/250ML-% IV SOLN
0.0000 ug/min | INTRAVENOUS | Status: DC
  Administered 2021-02-26: 22 ug/min via INTRAVENOUS
  Filled 2021-02-26: qty 250

## 2021-02-26 MED ORDER — HEPARIN SODIUM (PORCINE) 5000 UNIT/ML IJ SOLN: 5000.0000 [IU] | Freq: Three times a day (TID) | INTRAMUSCULAR | Status: DC

## 2021-02-26 MED ORDER — CHLORHEXIDINE GLUCONATE CLOTH 2 % EX PADS: 6.0000 | MEDICATED_PAD | Freq: Every day | CUTANEOUS | Status: DC

## 2021-02-26 MED ORDER — CALCIUM GLUCONATE-NACL 1-0.675 GM/50ML-% IV SOLN
1.0000 g | Freq: Once | INTRAVENOUS | Status: AC
  Administered 2021-02-26: 1000 mg via INTRAVENOUS
  Filled 2021-02-26: qty 50

## 2021-02-26 MED ORDER — VASOPRESSIN 20 UNITS/100 ML INFUSION FOR SHOCK
0.0000 [IU]/min | INTRAVENOUS | Status: DC
  Administered 2021-02-26: 0.03 [IU]/min via INTRAVENOUS
  Filled 2021-02-26: qty 100

## 2021-02-26 MED ORDER — SODIUM BICARBONATE 8.4 % IV SOLN
50.0000 meq | Freq: Once | INTRAVENOUS | Status: AC
  Administered 2021-02-26: 50 meq via INTRAVENOUS
  Filled 2021-02-26: qty 50

## 2021-02-26 MED ORDER — SODIUM CHLORIDE 0.9 % IV SOLN
250.0000 mL | INTRAVENOUS | Status: DC
  Administered 2021-02-26: 250 mL via INTRAVENOUS

## 2021-02-26 NOTE — ED Notes (Signed)
ICU team instructed RN to increase Levophed drip up to maximum dose to maintain bp.

## 2021-02-26 NOTE — ED Provider Notes (Signed)
Blood pressure (!) 97/30, pulse 91, temperature 98.5 F (36.9 C), temperature source Oral, resp. rate 15, SpO2 99 %.  Assuming care from Dr. Almyra Free.  In short, [name] is a 70 y.o. female with a chief complaint of Vascular Access Problem and Weakness .  Refer to the original H&P for additional details.  The current plan of care is to follow up with ICU team regarding admit.  Discussed patient's case with ICU to request admission. Patient and family (if present) updated with plan. Care transferred to ICU service.  I reviewed all nursing notes, vitals, pertinent old records, EKGs, labs, imaging (as available).     Margette Fast, MD 03/01/21 [phone]

## 2021-02-26 NOTE — Procedures (Signed)
Central Venous Catheter Insertion Procedure Note  [name]  [id number]  07-29-1950  Date:02/19/2021  Time:8:07 PM   Provider Performing:Gracemarie Skeet [name]   Procedure: Insertion of Non-tunneled Central Venous [phone]) with US guidance ([id number])   Indication(s) Hemodialysis  Consent Risks of the procedure as well as the alternatives and risks of each were explained to the patient and/or caregiver.  Consent for the procedure was obtained and is signed in the bedside chart  Anesthesia Topical only with 1% lidocaine   Timeout Verified patient identification, verified procedure, site/side was marked, verified correct patient position, special equipment/implants available, medications/allergies/relevant history reviewed, required imaging and test results available.  Sterile Technique Maximal sterile technique including full sterile barrier drape, hand hygiene, sterile gown, sterile gloves, mask, hair covering, sterile ultrasound probe cover (if used).  Procedure Description Area of catheter insertion was cleaned with chlorhexidine and draped in sterile fashion.  With real-time ultrasound guidance a HD catheter was placed into the left internal jugular vein. Nonpulsatile blood flow and easy flushing noted in all ports.  The catheter was sutured in place and sterile dressing applied.  Complications/Tolerance None; patient tolerated the procedure well. Chest X-ray is ordered to verify placement for internal jugular or subclavian cannulation.   Chest x-ray is not ordered for femoral cannulation.  EBL Minimal  Specimen(s) None     Georgann Housekeeper, AGACNP-BC [facility]  See Amion for personal pager PCCM on call pager [phone] until 7pm. Please call Elink 7p-7a. [phone]  03/11/2021 8:08 PM

## 2021-02-26 NOTE — Progress Notes (Addendum)
Critically ill patient, concerned that she is high risk of dying overnight due to refractory shock-- unsure at this point if septic or cardiogenic. Confirmed with sister that she is DNR. Her sister wants to stay with her overnight due agitation that Ms. Hedgepeth gets when she is alone. Discussed with unit director [name], who has approved for tonight.  Julian Hy, DO 03/17/2021 5:57 PM [facility]    Daughter Anderson Malta updated via phone, who is going to try to come visit tonight.  Julian Hy, DO 03/20/2021 6:49 PM [facility]

## 2021-02-26 NOTE — ED Provider Notes (Signed)
Emergency Medicine Provider Triage Evaluation Note  [name] , a 70 y.o. female  was evaluated in triage.  Pt complains of with history of end-stage renal disease.  She has a right upper arm dialysis fistula with palpable thrill and audible bruit.  She is attended by her sister.  She is complaining of weakness and fatigue.  Last full dialysis was 8 days ago.  Sister reports that the dialysis center said they tried accessing her fistula multiple times were unable to do so.  They had a small amount of dialysis Friday but not a full session.  She was sent in for further evaluation and potential dialysis access placement..  Review of Systems  Positive: Weakness Negative: Fever  Physical Exam  BP (!) 87/32 (BP Location: Left Arm)   Pulse 84   Temp 98.8 F (37.1 C) (Oral)   Resp 14   LMP  (LMP Unknown)   SpO2 (!) 86%  Gen:   Lethargic, no distress   Resp:  Normal effort  MSK:   Moves extremities without difficulty  Other:  Alert to place very weak  Medical Decision Making  Medically screening exam initiated at 9:53 AM.  Appropriate orders placed.  LEONETTE TISCHER was informed that the remainder of the evaluation will be completed by another provider, this initial triage assessment does not replace that evaluation, and the importance of remaining in the ED until their evaluation is complete.  Patient with missde dialysis, access issue? Lethargy and hypoxia- needs bed Placed on 02    Margarita Mail, PA-C 03/20/2021 [id number]    Luna Fuse, MD 03/12/2021 [id number]

## 2021-02-26 NOTE — ED Provider Notes (Signed)
[facility] EMERGENCY DEPARTMENT Provider Note   CSN: [id number] Arrival date & time: 03/20/2021  9741     History Chief Complaint  Patient presents with   Vascular Access Problem   Weakness    [name] is a 70 y.o. female.  Patient brought in by her sister who lives with the patient, chief complaint of generalized malaise and weakness.  They state that she is dialysis dependent and they went to get dialysis last Friday but had difficulty accessing the fistula.  They tried again on Monday but again had difficulty accessing the fistula and was scheduled for interventional evaluation today.  They went to the lab for evaluation, however the patient appeared sleepy and fatigued with low blood pressure, sent to the ER for further evaluation.  No reports of fevers or cough.  No reports of vomiting or diarrhea.  Unable to gain complete history from the patient as she does appear sleepy, however the sister states that this is how she typically is.  Family states that she is oxygen dependent 2 L at baseline.      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    Esophageal ulcer with bleeding    ESRD (end stage renal disease) ([place])    "suppose to start dialysis today" (06/15/2013)   Family history of adverse reaction to anesthesia    It took a while for pt sister to wake from anesthesia   GERD (gastroesophageal reflux disease)    GI bleed    Glaucoma    blind in both eyes   Heart murmur    Mild AS, moderate MR, moderate TR 10/30/16 echo   Hx of adenomatous colonic polyps 04/07/2018   Hypertension    Myalgia 12/31/2011   Neuropathy 12/31/2011   PAD (peripheral artery disease)  ([place])    nonviable tissue left lower extremity   Shortness of breath    "when she doesn't go to dialysis"   Type II diabetes mellitus ([place])    Type II    Patient Active Problem List   Diagnosis Date Noted   Closed right hip fracture ([place]) 12/22/2020   Benign neoplasm of colon    GI bleed 05/23/2020   Upper GI bleed 05/22/2020   Angiodysplasia of intestine with hemorrhage 01/24/2020   Acute blood loss anemia 12/16/2019   Hypotension 12/16/2019   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[place]) 11/17/2019   Infection of amputation stump, left lower extremity ([employer]) 10/19/2019   Peripheral artery disease ([place]) 09/30/2019   Gangrene of foot ([place]) 08/30/2019   Other fatigue 02/28/2019   Finger pain, left 02/03/2019   Trigger ring finger of left hand 02/03/2019   Osteomyelitis, unspecified ([place]) 11/16/2018   Diabetic retinopathy associated with type 2 diabetes mellitus ([place]) 11/15/2018   Rheumatoid factor positive 11/15/2018   Anemia in ESRD (end-stage renal disease) ([place]) 05/06/2018   Asthma 05/06/2018   Hx of adenomatous colonic polyps 04/07/2018   Status post carpal tunnel release 12/10/2017   Carpal tunnel syndrome, left upper limb 11/10/2017   Carpal tunnel syndrome, right upper limb 11/10/2017  Bilateral hand numbness 09/28/2017   Dependence on renal dialysis ([place]) 09/11/2017   Symptomatic anemia 07/06/2017   Hypertension 07/06/2017   Chronic combined systolic and diastolic CHF (congestive heart failure) ([place]) 78/93/8101   Complicated migraine 75/04/2584   ESRD on dialysis ([place]) 06/04/2017   Blindness of both eyes 06/04/2017   Glaucoma 06/04/2017   Essential hypertension 10/27/2016   GERD (gastroesophageal reflux disease) 10/27/2016   Hypercalcemia 05/28/2015   Abnormal stress test 03/15/2015   Poor venous access 02/08/2015   Diarrhea, unspecified 11/02/2014   Fever, unspecified 11/02/2014   Pain, unspecified 11/02/2014   Pruritus, unspecified 11/02/2014   Shortness  of breath 07/28/2014   Acquired absence of eye 03/31/2014   Coagulation defect, unspecified ([place]) 03/22/2014   Dysuria 02/14/2014   Encounter for immunization 01/03/2014   Iron deficiency anemia, unspecified 10/18/2013   Unspecified protein-calorie malnutrition ([place]) 27/78/2423   Complication of vascular dialysis catheter 06/21/2013   Fibromyalgia 06/21/2013   Personal history of breast cancer 06/21/2013   Personal history of other diseases of the musculoskeletal system and connective tissue 06/21/2013   Type 2 diabetes mellitus with diabetic polyneuropathy ([place]) 06/21/2013   Secondary hyperparathyroidism of renal origin ([place]) 06/20/2013   Unspecified complication of cardiac and vascular prosthetic device, implant and graft, subsequent encounter 06/20/2013   Type II diabetes mellitus with renal manifestations ([place]) 06/15/2013   Neuropathy 12/31/2011   Breast cancer of upper-inner quadrant of left female breast ([place]) 06/27/2011    Past Surgical History:  Procedure Laterality Date   A/V FISTULAGRAM N/A 09/21/2018   Procedure: A/V FISTULAGRAM - Right Upper;  Surgeon: [name], MD;  Location: [place] [facility];  Service: Cardiovascular;  Laterality: N/A;   ABDOMINAL AORTOGRAM N/A 11/30/2018   Procedure: ABDOMINAL AORTOGRAM;  Surgeon: [name], MD;  Location: [place] [facility] LAB;  Service: Cardiovascular;  Laterality: N/A;   ABDOMINAL AORTOGRAM W/LOWER EXTREMITY Left 07/29/2019   Procedure: ABDOMINAL AORTOGRAM W/LOWER EXTREMITY;  Surgeon: [name], MD;  Location: [place] [facility] LAB;  Service: Cardiovascular;  Laterality: Left;   ABDOMINAL HYSTERECTOMY     partial   AMPUTATION Left 08/30/2019   Procedure: AMPUTATION BELOW KNEE LEFT;  Surgeon: [name], MD;  Location: [place];  Service: Vascular;  Laterality: Left;   AMPUTATION Left 10/21/2019   Procedure: Amputation Above Knee;  Surgeon: Rosetta Posner, MD;  Location: [place];  Service: Vascular;  Laterality: Left;    AMPUTATION Right 11/17/2019   Procedure: AMPUTATION ABOVE KNEE, right leg;  Surgeon: Rosetta Posner, MD;  Location: [place];  Service: Vascular;  Laterality: Right;   AV FISTULA PLACEMENT Right 06/06/2013   Procedure: ARTERIOVENOUS (AV) FISTULA CREATION-RIGHT BRACHIAL CEPHALIC;  Surgeon: Conrad North Pearsall, MD;  Location: [place];  Service: Vascular;  Laterality: Right;   BREAST BIOPSY Left    BREAST LUMPECTOMY Left    "and took out some lymph nodes" (06/15/2013)   CARDIAC CATHETERIZATION     04/02/15 [facility]): no angiographic CAD, LVEF 40% with global hypokinesis (LHC done for + stress echo, EF 45% 11/28/14)   CARPAL TUNNEL RELEASE Left 11/26/2017   Procedure: LEFT CARPAL TUNNEL RELEASE;  Surgeon: [name], MD;  Location: [place];  Service: Orthopedics;  Laterality: Left;   CATARACT EXTRACTION W/ ANTERIOR VITRECTOMY Bilateral    CESAREAN SECTION  1980   COLONOSCOPY     COLONOSCOPY WITH PROPOFOL N/A 05/25/2020   Procedure: COLONOSCOPY WITH PROPOFOL;  Surgeon: Yetta Flock, MD;  Location: [place];  Service: Gastroenterology;  Laterality: N/A;   ENTEROSCOPY N/A 12/18/2019   Procedure: ENTEROSCOPY;  Surgeon: Rush Landmark Telford Nab., MD;  Location: [place];  Service: Gastroenterology;  Laterality: N/A;   ENTEROSCOPY N/A 05/24/2020   Procedure: ENTEROSCOPY;  Surgeon: Jackquline Denmark, MD;  Location: [facility] ENDOSCOPY;  Service: Endoscopy;  Laterality: N/A;   ESOPHAGOGASTRODUODENOSCOPY N/A 07/08/2017   Procedure: ESOPHAGOGASTRODUODENOSCOPY (EGD);  Surgeon: [name], MD;  Location: [facility]OSCOPY;  Service: Endoscopy;  Laterality: N/A;   ESOPHAGOGASTRODUODENOSCOPY (EGD) WITH PROPOFOL N/A 02/07/2018   Procedure: ESOPHAGOGASTRODUODENOSCOPY (EGD) WITH PROPOFOL;  Surgeon: [name], MD;  Location: [facility] ENDOSCOPY;  Service: Gastroenterology;  Laterality: N/A;  APC and clips placed   EYE SURGERY Bilateral    laser surgery   GIVENS CAPSULE STUDY N/A 12/19/2019   Procedure: GIVENS CAPSULE STUDY;   Surgeon: Irving Copas., MD;  Location: [place];  Service: Gastroenterology;  Laterality: N/A;   HOT HEMOSTASIS N/A 12/18/2019   Procedure: HOT HEMOSTASIS (ARGON PLASMA COAGULATION/BICAP);  Surgeon: Irving Copas., MD;  Location: [place];  Service: Gastroenterology;  Laterality: N/A;   HOT HEMOSTASIS Right 05/24/2020   Procedure: HOT HEMOSTASIS (ARGON PLASMA COAGULATION/BICAP);  Surgeon: Jackquline Denmark, MD;  Location: [facility] ENDOSCOPY;  Service: Endoscopy;  Laterality: Right;  r colon   INTRAMEDULLARY (IM) NAIL INTERTROCHANTERIC Right 12/23/2020   Procedure: INTRAMEDULLARY (IM) AFFIXUS NAIL INTERTROCHANTRIC;  Surgeon: Altamese Sherburne, MD;  Location: [place];  Service: Orthopedics;  Laterality: Right;   LOWER EXTREMITY ANGIOGRAPHY Bilateral 11/30/2018   Procedure: LOWER EXTREMITY ANGIOGRAPHY;  Surgeon: [name], MD;  Location: [place] CV LAB;  Service: Cardiovascular;  Laterality: Bilateral;   PARS PLANA VITRECTOMY Right 05/05/2017   Procedure: PARS PLANA VITRECTOMY WITH 25 GAUGE; PARTIAL REMOVAL OF OIL; INFERIOR PERIPHERAL IRIDECTOMY, REFORM ANTERIOR CHAMBER RIGHT EYE;  Surgeon: [name], MD;  Location: [place];  Service: Ophthalmology;  Laterality: Right;   PERIPHERAL VASCULAR BALLOON ANGIOPLASTY Right 09/21/2018   Procedure: PERIPHERAL VASCULAR BALLOON ANGIOPLASTY;  Surgeon: [name], MD;  Location: [place] CV LAB;  Service: Cardiovascular;  Laterality: Right;  AV fistula   PERIPHERAL VASCULAR BALLOON ANGIOPLASTY Left 11/30/2018   Procedure: PERIPHERAL VASCULAR BALLOON ANGIOPLASTY;  Surgeon: [name], MD;  Location: [place] CV LAB;  Service: Cardiovascular;  Laterality: Left;  Left Anterior Tibial Artery   POLYPECTOMY  05/25/2020   Procedure: POLYPECTOMY;  Surgeon: Yetta Flock, MD;  Location: [place] ENDOSCOPY;  Service: Gastroenterology;;   REFRACTIVE SURGERY Bilateral    REMOVAL OF A DIALYSIS CATHETER Right 06/06/2013   Procedure:  REMOVAL OF RIGHT MEDIPORT;  Surgeon: Conrad [place]. Paul, MD;  Location: [place];  Service: Vascular;  Laterality: Right;   SUBMUCOSAL TATTOO INJECTION  12/18/2019   Procedure: SUBMUCOSAL TATTOO INJECTION;  Surgeon: Irving Copas., MD;  Location: [place];  Service: Gastroenterology;;   TONSILLECTOMY     UPPER GASTROINTESTINAL ENDOSCOPY     WOUND DEBRIDEMENT Left 09/30/2019   Procedure: IRRIGATION AND DEBRIDEMENT WOUND OF LEFT BELOW KNEE AMPUTATION;  Surgeon: [name], MD;  Location: [place];  Service: Vascular;  Laterality: Left;     OB History   No obstetric history on file.     Family History  Problem Relation Age of Onset   Diabetes Mother    Hyperlipidemia Mother    Hypertension Mother    Hypertension Father    Diabetes Sister    Diabetes Brother    Hypertension Brother    Heart attack Brother    Kidney  disease Brother    Colon cancer Neg Hx    Colon polyps Neg Hx    Esophageal cancer Neg Hx    Gallbladder disease Neg Hx    Rectal cancer Neg Hx    Stomach cancer Neg Hx     Social History   Tobacco Use   Smoking status: Never   Smokeless tobacco: Never  Vaping Use   Vaping Use: Never used  Substance Use Topics   Alcohol use: No   Drug use: No    Home Medications Prior to Admission medications   Medication Sig Start Date End Date Taking? Authorizing Provider  albuterol (VENTOLIN HFA) 108 (90 Base) MCG/ACT inhaler Inhale 2 puffs into the lungs every 6 (six) hours as needed for wheezing or shortness of breath.    Yes [provider]  aspirin EC 81 MG tablet Take 81 mg by mouth daily. Swallow whole.   Yes [provider]  atropine 1 % ophthalmic solution Place 1 drop into the right eye 2 (two) times daily as needed (dry eyes).  07/04/17  Yes [provider]  BD PEN NEEDLE NANO 2ND GEN 32G X 4 MM MISC in the morning, at noon, and at bedtime.  03/03/19  Yes [provider]  darbepoetin (ARANESP) 200 MCG/0.4ML SOLN injection  Inject 0.4 mLs (200 mcg total) into the vein every Wednesday with hemodialysis. 06/22/13  Yes Eulogio Bear U, DO  diclofenac sodium (VOLTAREN) 1 % GEL Apply 2 g topically 4 (four) times daily as needed (pain).  04/28/19  Yes [provider]  diphenhydrAMINE (BENADRYL) 25 MG tablet Take 25 mg by mouth See admin instructions. Take one tablet (25 mg) by mouth daily at bedtime, and take one tablet (25 mg) by mouth on Monday, Wednesday, Friday mornings before dialysis   Yes [provider]  doxercalciferol (HECTOROL) 4 MCG/2ML injection Inject 0.5 mLs (1 mcg total) into the vein every Monday, Wednesday, and Friday with hemodialysis. 06/18/13  Yes Vann, Jessica U, DO  fluticasone (FLONASE) 50 MCG/ACT nasal spray Place 1-2 sprays into both nostrils daily. 01/11/21  Yes Wieters, Hallie C, PA-C  gabapentin (NEURONTIN) 100 MG capsule Take 100 mg by mouth in the morning.   Yes [provider]  insulin aspart (NOVOLOG) 100 UNIT/ML FlexPen Inject 6-8 Units into the skin 2 (two) times daily with a meal. 6 units in the evening, 8 units in the morning   Yes [provider]  ipratropium-albuterol (DUONEB) 0.5-2.5 (3) MG/3ML SOLN Take 3 mLs by nebulization every 6 (six) hours as needed (wheezing and shortness of breath).   Yes [provider]  lanthanum (FOSRENOL) 1000 MG chewable tablet Chew 2,000-4,000 mg by mouth See admin instructions. Chew 4 tablets (4000 mg) by mouth with each meal and 2 tablets (2000 mg) with snacks   Yes [provider]  omeprazole (PRILOSEC) 40 MG capsule Take 1 capsule (40 mg total) by mouth daily. 06/11/20  Yes Esterwood, Amy S, PA-C  ONETOUCH VERIO test strip 1 each by Other route 3 (three) times daily.  12/15/17  Yes [provider]  OXYGEN Inhale 3 L into the lungs continuous.   Yes [provider]  prednisoLONE acetate (PRED FORTE) 1 % ophthalmic suspension Place 1 drop into the right eye daily. For eye pain 04/27/17  Yes  [provider]  docusate sodium (COLACE) 100 MG capsule Take 1 capsule (100 mg total) by mouth 2 (two) times daily. Patient not taking: No sig reported 12/27/20  Mariel Aloe, MD  HYDROmorphone (DILAUDID) 2 MG tablet Take 1 tablet (2 mg total) by mouth every 6 (six) hours as needed for moderate pain or severe pain. Patient not taking: No sig reported 12/27/20   Ainsley Spinner, PA-C  ondansetron (ZOFRAN) 4 MG tablet Take 1 tablet (4 mg total) by mouth every 6 (six) hours. Patient not taking: No sig reported 01/14/21   Lennice Sites, DO    Allergies    Tape, Latex, Oxycodone, Tramadol, Aspirin, Morphine and related, and Vicodin [hydrocodone-acetaminophen]  Review of Systems   Review of Systems  Unable to perform ROS: Mental status change   Physical Exam Updated Vital Signs BP (!) 70/53   Pulse 91   Temp 98.5 F (36.9 C) (Oral)   Resp 17   LMP  (LMP Unknown)   SpO2 99%   Physical Exam Constitutional:      Comments: Patient is somnolent but arousable.  She will wake up and answer questions after multiple repetitions.  HENT:     Head: Normocephalic.     Nose: Nose normal.  Eyes:     Extraocular Movements: Extraocular movements intact.  Cardiovascular:     Rate and Rhythm: Normal rate.  Pulmonary:     Effort: Pulmonary effort is normal.  Musculoskeletal:     Cervical back: Normal range of motion.     Comments: Bilateral BKA's.  Neurological:     Comments: Appears somnolent, moderately obtunded.  Sister is at bedside and states that this is how she typically is.  She does appear to be moving all extremities and will oftentimes wake up and say a few words, and then falls back asleep.    ED Results / Procedures / Treatments   Labs (all labs ordered are listed, but only abnormal results are displayed) Labs Reviewed  CBC WITH DIFFERENTIAL/PLATELET - Abnormal; Notable for the following components:      Result Value   WBC 11.7 (*)    RBC 2.92 (*)    Hemoglobin 9.7 (*)     HCT 29.9 (*)    MCV 102.4 (*)    RDW 22.1 (*)    Platelets 67 (*)    Neutro Abs 10.2 (*)    Lymphs Abs 0.5 (*)    Abs Immature Granulocytes 0.32 (*)    All other components within normal limits  BRAIN NATRIURETIC PEPTIDE - Abnormal; Notable for the following components:   B Natriuretic Peptide >4,500.0 (*)    All other components within normal limits  BASIC METABOLIC PANEL - Abnormal; Notable for the following components:   Chloride 97 (*)    CO2 18 (*)    BUN 39 (*)    Creatinine, Ser 9.37 (*)    Calcium 8.7 (*)    GFR, Estimated 4 (*)    Anion gap 23 (*)    All other components within normal limits  I-STAT CHEM 8, ED - Abnormal; Notable for the following components:   BUN 38 (*)    Creatinine, Ser 9.30 (*)    Calcium, Ion 0.92 (*)    TCO2 21 (*)    Hemoglobin 11.6 (*)    HCT 34.0 (*)    All other components within normal limits  CULTURE, BLOOD (ROUTINE X 2)  CULTURE, BLOOD (ROUTINE X 2)  RESP PANEL BY RT-PCR (FLU A&B, COVID) ARPGX2  LACTIC ACID, PLASMA  BLOOD GAS, VENOUS  I-STAT VENOUS BLOOD GAS, ED    EKG EKG Interpretation  Date/Time:  Tuesday February 26 2021 10:04:44  EDT Ventricular Rate:  91 PR Interval:  222 QRS Duration: 94 QT Interval:  366 QTC Calculation: 450 R Axis:   -7 Text Interpretation: Sinus rhythm with sinus arrhythmia with 1st degree A-V block Left ventricular hypertrophy with repolarization abnormality ( R in aVL , Cornell product ) Abnormal ECG Confirmed by Thamas Jaegers ([id number]) on 02/22/2021 12:24:12 PM  Radiology CT ABDOMEN PELVIS WO CONTRAST  Result Date: 02/24/2021 CLINICAL DATA:  Abdominal distension, diarrhea, occasional abdominal pain, dialysis, missed dialysis Friday EXAM: CT ABDOMEN AND PELVIS WITHOUT CONTRAST TECHNIQUE: Multidetector CT imaging of the abdomen and pelvis was performed following the standard protocol without IV contrast. COMPARISON:  11/22/2020 FINDINGS: Lower chest: Cardiomegaly. Extensive 3 vessel coronary artery  calcifications and mitral annulus calcifications. Aortic valve calcifications versus surgical prosthesis, not well imaged. Small bilateral pleural effusions and associated atelectasis or consolidation. Interlobular septal thickening and ground-glass airspace opacity throughout the lung bases. Hepatobiliary: No solid liver abnormality is seen. No gallstones, gallbladder wall thickening, or biliary dilatation. Pancreas: Unremarkable. No pancreatic ductal dilatation or surrounding inflammatory changes. Spleen: Normal in size without significant abnormality. Adrenals/Urinary Tract: Adrenal glands are unremarkable. Atrophic kidneys. No hydronephrosis. Thickening of the decompressed urinary bladder. Stomach/Bowel: Stomach is within normal limits. Appendix is not clearly visualized. No evidence of bowel wall thickening, distention, or inflammatory changes. Vascular/Lymphatic: Aortic atherosclerosis and pipe like vascular calcinosis. No enlarged abdominal or pelvic lymph nodes. Reproductive: Status post hysterectomy. Other: No abdominal wall hernia or abnormality. Small volume ascites throughout the abdomen and pelvis. Musculoskeletal: No acute or significant osseous findings. Renal osteodystrophy. IMPRESSION: 1. No specific non-contrast CT findings of the abdomen or pelvis to explain abdominal pain or diarrhea. 2. Small bilateral pleural effusions and associated atelectasis or consolidation. Interlobular septal thickening and ground-glass airspace opacity throughout the lung bases, most consistent with pulmonary edema. 3. Small volume ascites throughout the abdomen and pelvis. 4. Cardiomegaly and coronary artery disease. Aortic Atherosclerosis (ICD10-I70.0). Electronically Signed   By: Eddie Candle M.D.   On: 02/24/2021 17:48   DG Chest 2 View  Result Date: 02/25/2021 CLINICAL DATA:  Shortness of breath. EXAM: CHEST - 2 VIEW COMPARISON:  Prior chest radiographs 02/24/2021 and earlier. FINDINGS: Shallow inspiration  radiograph. Tortuous and calcified thoracic aorta. Redemonstrated cardiomegaly with pulmonary vascular congestion and interstitial edema. Superimposed patchy opacities within the mid to lower lung fields bilaterally, slightly progressed from the chest radiograph of 02/24/2021. Trace bilateral pleural effusions. No definite left pleural effusion. No evidence of pneumothorax. No acute bony abnormality identified. IMPRESSION: Shallow inspiration radiograph. Redemonstrated cardiomegaly with pulmonary vascular congestion and interstitial edema. Superimposed patchy opacities within the mid-to-lower lung fields bilaterally, slightly progressed from the chest radiograph of 02/24/2021. This may reflect pulmonary edema or pneumonia. Clinical correlation is recommended. Trace bilateral pleural effusions. Tortuous and calcified thoracic aorta. Electronically Signed   By: Kellie Simmering DO   On: 03/20/2021 11:28   DG Chest Port 1 View  Result Date: 02/24/2021 CLINICAL DATA:  Hypoxia EXAM: PORTABLE CHEST 1 VIEW COMPARISON:  12/16/2019 FINDINGS: Cardiomegaly. Worsening bilateral perihilar and lower lobe airspace disease, likely edema. No effusions. No acute bony abnormality. Diffuse aortic calcifications. No visible aneurysm. IMPRESSION: Worsening bilateral airspace disease compatible with edema/CHF. Electronically Signed   By: Rolm Baptise M.D.   On: 02/24/2021 19:14    Procedures .Critical Care  Date/Time: 02/25/2021 3:37 PM Performed by: Luna Fuse, MD Authorized by: Luna Fuse, MD   Critical care provider statement:    Critical care time (minutes):  40   Critical care time was exclusive of:  Separately billable procedures and treating other patients   Critical care was necessary to treat or prevent imminent or life-threatening deterioration of the following conditions:  Circulatory failure   Medications Ordered in ED Medications  sodium chloride 0.9 % bolus 500 mL (has no administration in time range)   sodium chloride 0.9 % bolus 500 mL (0 mLs Intravenous Stopped 02/18/2021 1454)  sodium chloride 0.9 % bolus 500 mL (0 mLs Intravenous Stopped 03/16/2021 1534)    ED Course  I have reviewed the triage vital signs and the nursing notes.  Pertinent labs & imaging results that were available during my care of the patient were reviewed by me and considered in my medical decision making (see chart for details).    MDM Rules/Calculators/A&P                           Labs show white count of 11 chemistry is unremarkable but consistent with end-stage kidney disease which is her history.  Imaging was ordered but cannot be obtained by respiratory therapist.  She has poor vasculature and pulses are very faint along most of her extremities.  I did order a CT scan of the head.  However the patient is much more awake at this time states she is hungry and wants to eat.  Blood pressure is labile at times ranging from 21V to 47F systolic.  She was given 1 L bolus of IV fluids, 500 cc at a time.  She had mild improvement of blood pressure, however blood pressure still dropped to about 75 systolic.  At this point ICU has been consulted for evaluation.  I spoke with nephrology Dr.Shertz who will try to arrange for dialysis access for the patient.   Final Clinical Impression(s) / ED Diagnoses Final diagnoses:  ESRD (end stage renal disease) ([place])  Hypotension, unspecified hypotension type    Rx / DC Orders ED Discharge Orders     None        Luna Fuse, MD 03/09/2021 1537

## 2021-02-26 NOTE — ED Notes (Signed)
MD drew blood cultures, lactic acid and vbg by fem stick draw.

## 2021-02-26 NOTE — ED Notes (Signed)
Dr.Hong notified of pt bp of 83/31. IVF is currently on bolus.

## 2021-02-26 NOTE — Progress Notes (Signed)
Pharmacy Antibiotic Note  [name] is a 70 y.o. female admitted on 03/08/2021 with sepsis.  Pharmacy has been consulted for vancomycin dosing. Patient with a history of of anemia, ESRD on HD MWF, HTN, PAD sp bilat AKA presented w/ gen malaise and weakness.   Patient missed HD session yesterday 2/2 to malfunctioning access. IR consult for for fistulogram w/ declot vs new HD cath per nephrology note. Planning for HD 8/10 pending access.  Patient hypotensive in the ED requiring vasopressors.  Plan: Vancomycin 1500mg  once Further dosing to be determined once HD schedule established F/u nephrology recommendations De-escalate antibiotics as appropriate     Temp (24hrs), Avg:98.4 F (36.9 C), Min:97.8 F (36.6 C), Max:98.8 F (37.1 C)  Recent Labs  Lab 02/24/21 1549 03/13/2021 0955 03/16/2021 1024 03/07/2021 1409  WBC 8.0 11.7*  --   --   CREATININE 7.82* 9.37* 9.30*  --   LATICACIDVEN 2.1*  --   --  3.5*    CrCl cannot be calculated (Unknown ideal weight.).    Allergies  Allergen Reactions   Tape Itching and Rash    85M Transpore adhesive tape. Medical tape pulls off the skin!! PAPER TAPE ONLY, PLEASE   Latex Hives   Oxycodone Other (See Comments)    Hallucinations    Tramadol Other (See Comments)    Hallucinations with a full tablet   Aspirin Other (See Comments)    Trouble with stomach bleeding   Morphine And Related Other (See Comments)    Hallucinations    Vicodin [Hydrocodone-Acetaminophen] Other (See Comments)    Hallucinations    Antimicrobials this admission: Zosyn given once in ED Vancomycin 8/9 >>   Microbiology results: Pending  Thank you for allowing pharmacy to be a part of this patient's care.  Heloise Purpura 03/16/2021 5:27 PM

## 2021-02-26 NOTE — Consult Note (Signed)
Chief Complaint: Patient was seen in consultation today for  Chief Complaint  Patient presents with   Vascular Access Problem   Weakness    Referring Physician(s): Dr. Jonnie Finner  Supervising Physician: Arne Cleveland  Patient Status: [facility] - ED  History of Present Illness: [name] is a 70 y.o. 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    The patient was scheduled for HD yesterday but the dialysis team was unsuccessful in cannulating her fistula. She presented to the ED today with complaints of general malaise and weakness.  Interventional Radiology has been asked by the Nephrology team to evaluate this patient for an image-guided fistulogram with possible intervention, possible placement of a tunneled dialysis catheter.   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    Esophageal ulcer with bleeding    ESRD (end stage renal disease) ([place])    "suppose to start dialysis today" (06/15/2013)   Family history of adverse reaction to anesthesia    It took a while for pt sister to wake from anesthesia   GERD (gastroesophageal reflux disease)    GI bleed    Glaucoma    blind in both eyes   Heart murmur    Mild AS, moderate MR, moderate TR 10/30/16 echo   Hx of adenomatous colonic polyps 04/07/2018   Hypertension    Myalgia 12/31/2011   Neuropathy 12/31/2011   PAD (peripheral artery disease) ([place])    nonviable tissue left lower extremity   Shortness of breath    "when she doesn't go to  dialysis"   Type II diabetes mellitus ([place])    Type II    Past Surgical History:  Procedure Laterality Date   A/V FISTULAGRAM N/A 09/21/2018   Procedure: A/V FISTULAGRAM - Right Upper;  Surgeon: [name], MD;  Location: [place] CV LAB;  Service: Cardiovascular;  Laterality: N/A;   ABDOMINAL AORTOGRAM N/A 11/30/2018   Procedure: ABDOMINAL AORTOGRAM;  Surgeon: [name], MD;  Location: [place] CV LAB;  Service: Cardiovascular;  Laterality: N/A;   ABDOMINAL AORTOGRAM W/LOWER EXTREMITY Left 07/29/2019   Procedure: ABDOMINAL AORTOGRAM W/LOWER EXTREMITY;  Surgeon: [name], MD;  Location: [place] CV LAB;  Service: Cardiovascular;  Laterality: Left;   ABDOMINAL HYSTERECTOMY     partial   AMPUTATION Left 08/30/2019   Procedure: AMPUTATION BELOW KNEE LEFT;  Surgeon: [name], MD;  Location: [facility] OR;  Service: Vascular;  Laterality: Left;   AMPUTATION Left 10/21/2019   Procedure: Amputation Above Knee;  Surgeon: Rosetta Posner, MD;  Location: [place];  Service: Vascular;  Laterality: Left;   AMPUTATION Right 11/17/2019   Procedure: AMPUTATION ABOVE KNEE, right leg;  Surgeon: Rosetta Posner, MD;  Location: [place];  Service: Vascular;  Laterality: Right;   AV FISTULA PLACEMENT Right 06/06/2013   Procedure: ARTERIOVENOUS (AV) FISTULA CREATION-RIGHT BRACHIAL CEPHALIC;  Surgeon: Conrad , MD;  Location: [facility]  OR;  Service: Vascular;  Laterality: Right;   BREAST BIOPSY Left    BREAST LUMPECTOMY Left    "and took out some lymph nodes" (06/15/2013)   CARDIAC CATHETERIZATION     04/02/15 [facility]): no angiographic CAD, LVEF 40% with global hypokinesis (LHC done for + stress echo, EF 45% 11/28/14)   CARPAL TUNNEL RELEASE Left 11/26/2017   Procedure: LEFT CARPAL TUNNEL RELEASE;  Surgeon: [name], MD;  Location: [place];  Service: Orthopedics;  Laterality: Left;   CATARACT EXTRACTION W/ ANTERIOR VITRECTOMY Bilateral    CESAREAN SECTION  1980   COLONOSCOPY      COLONOSCOPY WITH PROPOFOL N/A 05/25/2020   Procedure: COLONOSCOPY WITH PROPOFOL;  Surgeon: Yetta Flock, MD;  Location: [place];  Service: Gastroenterology;  Laterality: N/A;   ENTEROSCOPY N/A 12/18/2019   Procedure: ENTEROSCOPY;  Surgeon: Rush Landmark Telford Nab., MD;  Location: [place];  Service: Gastroenterology;  Laterality: N/A;   ENTEROSCOPY N/A 05/24/2020   Procedure: ENTEROSCOPY;  Surgeon: Jackquline Denmark, MD;  Location: [facility] ENDOSCOPY;  Service: Endoscopy;  Laterality: N/A;   ESOPHAGOGASTRODUODENOSCOPY N/A 07/08/2017   Procedure: ESOPHAGOGASTRODUODENOSCOPY (EGD);  Surgeon: [name], MD;  Location: [facility] ENDOSCOPY;  Service: Endoscopy;  Laterality: N/A;   ESOPHAGOGASTRODUODENOSCOPY (EGD) WITH PROPOFOL N/A 02/07/2018   Procedure: ESOPHAGOGASTRODUODENOSCOPY (EGD) WITH PROPOFOL;  Surgeon: [name], MD;  Location: [facility] ENDOSCOPY;  Service: Gastroenterology;  Laterality: N/A;  APC and clips placed   EYE SURGERY Bilateral    laser surgery   GIVENS CAPSULE STUDY N/A 12/19/2019   Procedure: GIVENS CAPSULE STUDY;  Surgeon: Irving Copas., MD;  Location: [place];  Service: Gastroenterology;  Laterality: N/A;   HOT HEMOSTASIS N/A 12/18/2019   Procedure: HOT HEMOSTASIS (ARGON PLASMA COAGULATION/BICAP);  Surgeon: Irving Copas., MD;  Location: [place];  Service: Gastroenterology;  Laterality: N/A;   HOT HEMOSTASIS Right 05/24/2020   Procedure: HOT HEMOSTASIS (ARGON PLASMA COAGULATION/BICAP);  Surgeon: Jackquline Denmark, MD;  Location: [facility] ENDOSCOPY;  Service: Endoscopy;  Laterality: Right;  r colon   INTRAMEDULLARY (IM) NAIL INTERTROCHANTERIC Right 12/23/2020   Procedure: INTRAMEDULLARY (IM) AFFIXUS NAIL INTERTROCHANTRIC;  Surgeon: Altamese [place] Monroe, MD;  Location: [place];  Service: Orthopedics;  Laterality: Right;   LOWER EXTREMITY ANGIOGRAPHY Bilateral 11/30/2018   Procedure: LOWER EXTREMITY ANGIOGRAPHY;  Surgeon: [name], MD;  Location: [place] CV LAB;   Service: Cardiovascular;  Laterality: Bilateral;   PARS PLANA VITRECTOMY Right 05/05/2017   Procedure: PARS PLANA VITRECTOMY WITH 25 GAUGE; PARTIAL REMOVAL OF OIL; INFERIOR PERIPHERAL IRIDECTOMY, REFORM ANTERIOR CHAMBER RIGHT EYE;  Surgeon: [name], MD;  Location: [place];  Service: Ophthalmology;  Laterality: Right;   PERIPHERAL VASCULAR BALLOON ANGIOPLASTY Right 09/21/2018   Procedure: PERIPHERAL VASCULAR BALLOON ANGIOPLASTY;  Surgeon: [name], MD;  Location: [place] CV LAB;  Service: Cardiovascular;  Laterality: Right;  AV fistula   PERIPHERAL VASCULAR BALLOON ANGIOPLASTY Left 11/30/2018   Procedure: PERIPHERAL VASCULAR BALLOON ANGIOPLASTY;  Surgeon: [name], MD;  Location: [place] CV LAB;  Service: Cardiovascular;  Laterality: Left;  Left Anterior Tibial Artery   POLYPECTOMY  05/25/2020   Procedure: POLYPECTOMY;  Surgeon: Yetta Flock, MD;  Location: [place] ENDOSCOPY;  Service: Gastroenterology;;   REFRACTIVE SURGERY Bilateral    REMOVAL OF A DIALYSIS CATHETER Right 06/06/2013   Procedure: REMOVAL OF RIGHT MEDIPORT;  Surgeon: Conrad Spaulding, MD;  Location: [place];  Service: Vascular;  Laterality: Right;   SUBMUCOSAL TATTOO INJECTION  12/18/2019   Procedure: SUBMUCOSAL TATTOO  INJECTION;  Surgeon: Rush Landmark Telford Nab., MD;  Location: [place];  Service: Gastroenterology;;   TONSILLECTOMY     UPPER GASTROINTESTINAL ENDOSCOPY     WOUND DEBRIDEMENT Left 09/30/2019   Procedure: IRRIGATION AND DEBRIDEMENT WOUND OF LEFT BELOW KNEE AMPUTATION;  Surgeon: [name], MD;  Location: [place];  Service: Vascular;  Laterality: Left;    Allergies: Tape, Latex, Oxycodone, Tramadol, Aspirin, Morphine and related, and Vicodin [hydrocodone-acetaminophen]  Medications: Prior to Admission medications   Medication Sig Start Date End Date Taking? Authorizing Provider  albuterol (VENTOLIN HFA) 108 (90 Base) MCG/ACT inhaler Inhale 2 puffs into the lungs every 6 (six) hours  as needed for wheezing or shortness of breath.    Yes [provider]  aspirin EC 81 MG tablet Take 81 mg by mouth daily. Swallow whole.   Yes [provider]  atropine 1 % ophthalmic solution Place 1 drop into the right eye 2 (two) times daily as needed (dry eyes).  07/04/17  Yes [provider]  BD PEN NEEDLE NANO 2ND GEN 32G X 4 MM MISC in the morning, at noon, and at bedtime.  03/03/19  Yes [provider]  darbepoetin (ARANESP) 200 MCG/0.4ML SOLN injection Inject 0.4 mLs (200 mcg total) into the vein every Wednesday with hemodialysis. 06/22/13  Yes Eulogio Bear U, DO  diclofenac sodium (VOLTAREN) 1 % GEL Apply 2 g topically 4 (four) times daily as needed (pain).  04/28/19  Yes [provider]  diphenhydrAMINE (BENADRYL) 25 MG tablet Take 25 mg by mouth See admin instructions. Take one tablet (25 mg) by mouth daily at bedtime, and take one tablet (25 mg) by mouth on Monday, Wednesday, Friday mornings before dialysis   Yes [provider]  doxercalciferol (HECTOROL) 4 MCG/2ML injection Inject 0.5 mLs (1 mcg total) into the vein every Monday, Wednesday, and Friday with hemodialysis. 06/18/13  Yes Vann, Jessica U, DO  fluticasone (FLONASE) 50 MCG/ACT nasal spray Place 1-2 sprays into both nostrils daily. 01/11/21  Yes Wieters, Hallie C, PA-C  gabapentin (NEURONTIN) 100 MG capsule Take 100 mg by mouth in the morning.   Yes [provider]  insulin aspart (NOVOLOG) 100 UNIT/ML FlexPen Inject 6-8 Units into the skin 2 (two) times daily with a meal. 6 units in the evening, 8 units in the morning   Yes [provider]  ipratropium-albuterol (DUONEB) 0.5-2.5 (3) MG/3ML SOLN Take 3 mLs by nebulization every 6 (six) hours as needed (wheezing and shortness of breath).   Yes [provider]  lanthanum (FOSRENOL) 1000 MG chewable tablet Chew 2,000-4,000 mg by mouth See admin instructions. Chew 4 tablets (4000 mg) by mouth with each meal  and 2 tablets (2000 mg) with snacks   Yes [provider]  omeprazole (PRILOSEC) 40 MG capsule Take 1 capsule (40 mg total) by mouth daily. 06/11/20  Yes Esterwood, Amy S, PA-C  ONETOUCH VERIO test strip 1 each by Other route 3 (three) times daily.  12/15/17  Yes [provider]  OXYGEN Inhale 3 L into the lungs continuous.   Yes [provider]  prednisoLONE acetate (PRED FORTE) 1 % ophthalmic suspension Place 1 drop into the right eye daily. For eye pain 04/27/17  Yes [provider]  docusate sodium (COLACE) 100 MG capsule Take 1 capsule (100 mg total) by mouth 2 (two) times daily. Patient not taking: No sig reported 12/27/20   Mariel Aloe, MD  HYDROmorphone (DILAUDID) 2 MG tablet Take 1 tablet (2 mg  total) by mouth every 6 (six) hours as needed for moderate pain or severe pain. Patient not taking: No sig reported 12/27/20   Ainsley Spinner, PA-C  ondansetron (ZOFRAN) 4 MG tablet Take 1 tablet (4 mg total) by mouth every 6 (six) hours. Patient not taking: No sig reported 01/14/21   Lennice Sites, DO     Family History  Problem Relation Age of Onset   Diabetes Mother    Hyperlipidemia Mother    Hypertension Mother    Hypertension Father    Diabetes Sister    Diabetes Brother    Hypertension Brother    Heart attack Brother    Kidney disease Brother    Colon cancer Neg Hx    Colon polyps Neg Hx    Esophageal cancer Neg Hx    Gallbladder disease Neg Hx    Rectal cancer Neg Hx    Stomach cancer Neg Hx     Social History   Socioeconomic History   Marital status: Single    Spouse name: Not on file   Number of children: 2   Years of education: 12   Highest education level: Not on file  Occupational History   Occupation: Diabled  Tobacco Use   Smoking status: Never   Smokeless tobacco: Never  Vaping Use   Vaping Use: Never used  Substance and Sexual Activity   Alcohol use: No   Drug use: No   Sexual activity: Not Currently    Comment:  Hysterectomy  Other Topics Concern   Not on file  Social History Narrative   She reports she is single with one son and one daughter. 3 caffeinated beverages a day. She dialyzes Monday Wednesday Friday at the [place].      Her sister is her primary caretaker and spends the nights with her   Social Determinants of Health   Financial Resource Strain: Medium Risk   Difficulty of Paying Living Expenses: Somewhat hard  Food Insecurity: Not on file  Transportation Needs: No Transportation Needs   Lack of Transportation (Medical): No   Lack of Transportation (Non-Medical): No  Physical Activity: Not on file  Stress: Not on file  Social Connections: Not on file    Review of Systems: A 12 point ROS discussed and pertinent positives are indicated in the HPI above.  All other systems are negative.  Review of Systems  Constitutional:  Positive for fatigue.  Respiratory:  Negative for cough and shortness of breath.   Cardiovascular:  Negative for chest pain and leg swelling.  Gastrointestinal:  Negative for abdominal pain, diarrhea, nausea and vomiting.  Psychiatric/Behavioral:  Positive for decreased concentration.    Vital Signs: BP (!) 76/42   Pulse 93   Temp 98.5 F (36.9 C) (Oral)   Resp 15   LMP  (LMP Unknown)   SpO2 99%   Physical Exam Constitutional:      General: She is not in acute distress.    Appearance: She is ill-appearing.  Cardiovascular:     Rate and Rhythm: Normal rate and regular rhythm.     Comments: Right upper arm AVF -  negative for thrill and bruit. Site is unremarkable.  Pulmonary:     Effort: Pulmonary effort is normal.     Breath sounds: Normal breath sounds.  Abdominal:     General: Bowel sounds are normal.     Palpations: Abdomen is soft.  Musculoskeletal:     Comments: Bilateral AKA  Skin:    General:  Skin is warm and dry.  Neurological:     Motor: Weakness present.     Comments: Sleepy but answered all questions  appropriately    Imaging: CT ABDOMEN PELVIS WO CONTRAST  Result Date: 02/24/2021 CLINICAL DATA:  Abdominal distension, diarrhea, occasional abdominal pain, dialysis, missed dialysis Friday EXAM: CT ABDOMEN AND PELVIS WITHOUT CONTRAST TECHNIQUE: Multidetector CT imaging of the abdomen and pelvis was performed following the standard protocol without IV contrast. COMPARISON:  11/22/2020 FINDINGS: Lower chest: Cardiomegaly. Extensive 3 vessel coronary artery calcifications and mitral annulus calcifications. Aortic valve calcifications versus surgical prosthesis, not well imaged. Small bilateral pleural effusions and associated atelectasis or consolidation. Interlobular septal thickening and ground-glass airspace opacity throughout the lung bases. Hepatobiliary: No solid liver abnormality is seen. No gallstones, gallbladder wall thickening, or biliary dilatation. Pancreas: Unremarkable. No pancreatic ductal dilatation or surrounding inflammatory changes. Spleen: Normal in size without significant abnormality. Adrenals/Urinary Tract: Adrenal glands are unremarkable. Atrophic kidneys. No hydronephrosis. Thickening of the decompressed urinary bladder. Stomach/Bowel: Stomach is within normal limits. Appendix is not clearly visualized. No evidence of bowel wall thickening, distention, or inflammatory changes. Vascular/Lymphatic: Aortic atherosclerosis and pipe like vascular calcinosis. No enlarged abdominal or pelvic lymph nodes. Reproductive: Status post hysterectomy. Other: No abdominal wall hernia or abnormality. Small volume ascites throughout the abdomen and pelvis. Musculoskeletal: No acute or significant osseous findings. Renal osteodystrophy. IMPRESSION: 1. No specific non-contrast CT findings of the abdomen or pelvis to explain abdominal pain or diarrhea. 2. Small bilateral pleural effusions and associated atelectasis or consolidation. Interlobular septal thickening and ground-glass airspace opacity throughout  the lung bases, most consistent with pulmonary edema. 3. Small volume ascites throughout the abdomen and pelvis. 4. Cardiomegaly and coronary artery disease. Aortic Atherosclerosis (ICD10-I70.0). Electronically Signed   By: Eddie Candle M.D.   On: 02/24/2021 17:48   DG Chest 2 View  Result Date: 02/18/2021 CLINICAL DATA:  Shortness of breath. EXAM: CHEST - 2 VIEW COMPARISON:  Prior chest radiographs 02/24/2021 and earlier. FINDINGS: Shallow inspiration radiograph. Tortuous and calcified thoracic aorta. Redemonstrated cardiomegaly with pulmonary vascular congestion and interstitial edema. Superimposed patchy opacities within the mid to lower lung fields bilaterally, slightly progressed from the chest radiograph of 02/24/2021. Trace bilateral pleural effusions. No definite left pleural effusion. No evidence of pneumothorax. No acute bony abnormality identified. IMPRESSION: Shallow inspiration radiograph. Redemonstrated cardiomegaly with pulmonary vascular congestion and interstitial edema. Superimposed patchy opacities within the mid-to-lower lung fields bilaterally, slightly progressed from the chest radiograph of 02/24/2021. This may reflect pulmonary edema or pneumonia. Clinical correlation is recommended. Trace bilateral pleural effusions. Tortuous and calcified thoracic aorta. Electronically Signed   By: Kellie Simmering DO   On: 03/15/2021 11:28   DG Chest Port 1 View  Result Date: 02/24/2021 CLINICAL DATA:  Hypoxia EXAM: PORTABLE CHEST 1 VIEW COMPARISON:  12/16/2019 FINDINGS: Cardiomegaly. Worsening bilateral perihilar and lower lobe airspace disease, likely edema. No effusions. No acute bony abnormality. Diffuse aortic calcifications. No visible aneurysm. IMPRESSION: Worsening bilateral airspace disease compatible with edema/CHF. Electronically Signed   By: Rolm Baptise M.D.   On: 02/24/2021 19:14    Labs:  CBC: Recent Labs    12/27/20 0143 01/14/21 1401 02/24/21 1549 03/19/2021 0955 02/19/2021 1024  03/20/2021 1535  WBC 5.7 5.0 8.0 11.7*  --   --   HGB 9.0* 8.0* 9.2* 9.7* 11.6* 11.2*  HCT 27.9* 25.5* 28.9* 29.9* 34.0* 33.0*  PLT 191 222 95* 67*  --   --     COAGS: Recent Labs  05/22/20 2200 05/23/20 0050  INR 1.5* 1.2  APTT  --  30    BMP: Recent Labs    12/27/20 0143 01/14/21 1401 02/24/21 1549 03/20/2021 0955 03/12/2021 1024 03/02/2021 1535  NA 134* 139 137 138 136 139  K 3.3* 2.9* 3.2* 3.9 3.7 3.8  CL 95* 96* 95* 97* 100  --   CO2 23 30 27  18*  --   --   GLUCOSE 146* 90 119* 87 84  --   BUN 13 8 26* 39* 38*  --   CALCIUM 8.6* 8.0* 9.0 8.7*  --   --   CREATININE 4.67* 3.65* 7.82* 9.37* 9.30*  --   GFRNONAA 10* 13* 5* 4*  --   --     LIVER FUNCTION TESTS: Recent Labs    05/25/20 0125 08/13/20 0956 01/14/21 1401 02/24/21 1549  BILITOT 1.1 1.3* 1.9* 3.8*  AST 27 33 29 34  ALT 20 25 12 28   ALKPHOS 76 121 105 90  PROT 6.1* 6.9 6.1* 5.5*  ALBUMIN 3.0* 3.3* 2.8* 2.5*    TUMOR MARKERS: No results for input(s): AFPTM, CEA, CA199, CHROMGRNA in the last 8760 hours.  Assessment and Plan:  ESRD requiring hemodialysis; malfunctioning right AVF: [name], 70 year old female, is tentatively scheduled for 03-05-2021 for an image-guided fistulogram with possible intervention, possible placement of a tunneled dialysis catheter. Her sister [name] signed the consent which is in IR.  Risks and benefits discussed with the patient including, but not limited to bleeding, infection, vascular injury, pulmonary embolism, need for tunneled HD catheter placement or even death.  All of the patient's questions were answered, patient is agreeable to proceed.  Consent signed and in IR. She will be NPO at midnight.   Thank you for this interesting consult.  I greatly enjoyed meeting [name] and look forward to participating in their care.  A copy of this report was sent to the requesting provider on this date.  Electronically Signed: Soyla Dryer,  AGACNP-BC [phone] 03/20/2021, 4:10 PM   I spent a total of 20 Minutes    in face to face in clinical consultation, greater than 50% of which was counseling/coordinating care for fistulogram with possible intervention; possible placement of tunneled dialysis catheter.

## 2021-02-26 NOTE — H&P (Addendum)
NAME:  [name], MRN:  [id number], DOB:  Dec 19, 1950, LOS: 0 ADMISSION DATE:  03/17/2021, CONSULTATION DATE:  03/05/2021 REFERRING MD:  EDP, CHIEF COMPLAINT:  Hypotension   History of Present Illness:  70 year old female with past medical history of ESRD, PAD, diabetes, hypertension, UGI bleed who presented with weakness and hypotension.  She has had a poorly functioning R upper extremity fistula for several weeks.  They were not able to access it at all during her normal scheduled dialysis on 8/8 and she was referred to a vascular office.  She presented there today and was referred to the ED for hypotension.  Her initial blood pressure was 87/32, she was given 1.5 L of IV fluid with minimal improvement.  Labs were significant for a lactic acid of 3.5, BNP greater than 4500, bicarb 18, anion gap 23, white blood cell count 11 K.  CXR with edema versus infiltrates.  She was seen by nephrology and plan was for admission with IR consult for fistulogram with declot versus new HD cath.  However, during her ED course she became increasingly hypotensive and required pressor support.  Therefore PCCM consulted for admission.  She was started on broad-spectrum antibiotics with vancomycin and Zosyn.  She has had intermittent somnolence in the ED, CT head without acute findings.  Her sister is at the bedside and supplements history with worsening baseline health problems including refusal to go to HD, decreased appetite, recent right hip fracture after a fall along with intermittent confusion.  She notes diarrhea 2 days ago along with nonproductive cough and no fever.  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   Significant Hospital Events: Including procedures, antibiotic start and stop dates in addition to other pertinent events   8/9, admit to PCCM, Levophed, vasopressin, vancomycin, and Zosyn initiated.  Interim History / Subjective:  As above  Objective   Blood pressure (!) 92/58, pulse 87, temperature 97.8 F (36.6 C), temperature source Oral, resp. rate 15, SpO2 99 %.        Intake/Output Summary (Last 24 hours) at 02/19/2021 1658 Last data filed at 02/20/2021 1653 Gross per 24 hour  Intake 1050 ml  Output --  Net 1050 ml   There were no vitals filed for this visit.   General: Chronically and critically ill-appearing female, laying in bed somnolent but arousable, no acute distress HEENT: MM pink/moist Neuro: Opens eyes to voice and will briefly nod before falling back to sleep CV: s1s2 RRR, harsh holosystolic murmur PULM: Decreased air entry bilateral bases without rhonchi or wheezing, on nasal cannula oxygen without retractions or tachypnea GI: soft, bsx4 active, diffusely TTP Extremities: warm/dry, s/p bilateral LE amputations Skin: no rashes or lesions   Resolved Hospital Problem list     Assessment & Plan:   Acute Shock Likely septic or cardiogenic  In the setting of baseline very poor functional status recently declining, poor appetite and increased lethargy. Consider PNA, had one day of diarrhea and has some abdominal pain on exam, had  negative abdominal CT two days ago Fistula without obvious cellulitis changes Last Echo 77yrs ago with EF 45-50% and moderate mitral regurge P: -Admit and continue pressor support to maintain MAP >65, will need  CVC, On Levophed and Vasopressin, received 1500cc IVF in the ED -Continue broad spectrum abx with Vanc and Zosyn -Blood cultures -trend lactic acid -echocardiogram -check procalcitonin    ESRD  On M/W/F schedule, last received full session 8/3 and partial session 8/5 Missed yesterday secondary to non-functioning fistula P: -appreciate Nephrology's assistance -plan for dialysis tomorrow -will place temporary HD catheter -IR consulted for fistulogram     Acute on Chronic Encephalopathy Baseline pt will converse with family, but has periods of lethargy and somnolence P: -head CT negative, likely metabolic and related to infection  -ABG     AGMA Suspect may be secondary to lactic acidosis P: -repeat lactic, treat underlying shock -give one AMP bicarb now and repeat labs       Best Practice (right click and "Reselect all SmartList Selections" daily)   Diet/type: NPO DVT prophylaxis: prophylactic heparin  GI prophylaxis: N/A Lines: N/A Foley:  N/A Code Status:  DNR Last date of multidisciplinary goals of care discussion [8/9 Goals of care discussion with Pt's sister, Dr. Raelyn Ensign NP, Pt's poor prognosis and acutely critical state were communicated sister states that Pt has clearly stated she does not want resuscitation if her heart were to stop, sister states that she does not want her to suffer]  Labs   CBC: Recent Labs  Lab 02/24/21 1549 03/07/2021 0955 03/07/2021 1024 03/16/2021 1535  WBC 8.0 11.7*  --   --   NEUTROABS 6.7 10.2*  --   --   HGB 9.2* 9.7* 11.6* 11.2*  HCT 28.9* 29.9* 34.0* 33.0*  MCV 102.1* 102.4*  --   --   PLT 95* 67*  --   --     Basic Metabolic Panel: Recent Labs  Lab 02/24/21 1549 03/15/2021 0955 02/25/2021 1024 02/25/2021 1535  NA 137 138 136 139  K 3.2* 3.9 3.7 3.8  CL 95* 97* 100  --   CO2 27 18*  --   --   GLUCOSE 119* 87 84  --   BUN 26* 39* 38*  --   CREATININE 7.82* 9.37* 9.30*  --   CALCIUM 9.0 8.7*  --   --   MG 2.0   --   --   --    GFR: CrCl cannot be calculated (Unknown ideal weight.). Recent Labs  Lab 02/24/21 1549 03/17/2021 0955 03/12/2021 1409  WBC 8.0 11.7*  --   LATICACIDVEN 2.1*  --  3.5*    Liver Function Tests: Recent Labs  Lab 02/24/21 1549  AST 34  ALT 28  ALKPHOS 90  BILITOT 3.8*  PROT 5.5*  ALBUMIN 2.5*   No results for input(s): LIPASE, AMYLASE in the last 168 hours. No results for input(s): AMMONIA in the last 168 hours.  ABG    Component Value Date/Time   HCO3 27.1 03/11/2021 1535   TCO2 28 03/04/2021 1535   O2SAT 74.0 03/04/2021 1535     Coagulation Profile: No results for input(s): INR, PROTIME in the last 168 hours.  Cardiac Enzymes: No results for input(s): CKTOTAL, CKMB, CKMBINDEX, TROPONINI in the last 168 hours.  HbA1C: Hgb A1c MFr Bld  Date/Time Value Ref Range Status  12/25/2020 03:02 AM 5.5 4.8 - 5.6 % Final    Comment:    (NOTE)  Prediabetes: 5.7 - 6.4         Diabetes: >6.4         Glycemic control for adults with diabetes: <7.0   05/22/2020 10:01 PM 5.2 4.8 - 5.6 % Final    Comment:    (NOTE) Pre diabetes:          5.7%-6.4%  Diabetes:              >6.4%  Glycemic control for   <7.0% adults with diabetes     CBG: No results for input(s): GLUCAP in the last 168 hours.  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   Surgical History:   Past Surgical History:  Procedure Laterality Date   A/V FISTULAGRAM N/A 09/21/2018   Procedure: A/V FISTULAGRAM - Right Upper;  Surgeon: [name], MD;  Location: [place] [facility];  Service: Cardiovascular;  Laterality: N/A;   ABDOMINAL AORTOGRAM N/A 11/30/2018   Procedure: ABDOMINAL AORTOGRAM;  Surgeon: [name], MD;  Location: [place] [facility];  Service: Cardiovascular;  Laterality: N/A;   ABDOMINAL AORTOGRAM W/LOWER EXTREMITY Left 07/29/2019   Procedure: ABDOMINAL AORTOGRAM W/LOWER EXTREMITY;  Surgeon: [name], MD;  Location: [place] [facility];  Service: Cardiovascular;  Laterality: Left;   ABDOMINAL HYSTERECTOMY     partial   AMPUTATION Left 08/30/2019   Procedure: AMPUTATION BELOW KNEE LEFT;  Surgeon: [name], MD;  Location: [place];  Service: Vascular;  Laterality: Left;   AMPUTATION Left 10/21/2019   Procedure: Amputation Above Knee;  Surgeon: Rosetta Posner, MD;  Location: [place];  Service: Vascular;  Laterality: Left;   AMPUTATION Right 11/17/2019   Procedure: AMPUTATION ABOVE KNEE, right leg;  Surgeon: Rosetta Posner, MD;  Location: [place];  Service: Vascular;  Laterality: Right;   AV FISTULA PLACEMENT Right 06/06/2013   Procedure: ARTERIOVENOUS (AV) FISTULA CREATION-RIGHT BRACHIAL CEPHALIC;  Surgeon: Conrad Fountain [place], MD;  Location: [place];  Service: Vascular;  Laterality: Right;   BREAST BIOPSY Left    BREAST LUMPECTOMY Left    "and took out some lymph nodes" (06/15/2013)   CARDIAC CATHETERIZATION     04/02/15 [facility]): no angiographic CAD, LVEF 40% with global hypokinesis (LHC done for + stress echo, EF 45% 11/28/14)   CARPAL TUNNEL RELEASE Left 11/26/2017   Procedure: LEFT CARPAL TUNNEL RELEASE;  Surgeon: [name], MD;  Location: [place];  Service: Orthopedics;  Laterality: Left;   CATARACT EXTRACTION W/ ANTERIOR VITRECTOMY Bilateral    CESAREAN  SECTION  1980   COLONOSCOPY     COLONOSCOPY WITH PROPOFOL N/A 05/25/2020   Procedure: COLONOSCOPY WITH PROPOFOL;  Surgeon: Yetta Flock, MD;  Location: [place];  Service: Gastroenterology;  Laterality: N/A;   ENTEROSCOPY N/A 12/18/2019   Procedure: ENTEROSCOPY;  Surgeon: Rush Landmark Telford Nab., MD;  Location: [place];  Service: Gastroenterology;  Laterality: N/A;   ENTEROSCOPY N/A 05/24/2020   Procedure: ENTEROSCOPY;  Surgeon: Jackquline Denmark, MD;  Location: [facility];  Service: Endoscopy;  Laterality: N/A;   ESOPHAGOGASTRODUODENOSCOPY N/A 07/08/2017   Procedure: ESOPHAGOGASTRODUODENOSCOPY (EGD);  Surgeon: [name], MD;  Location: [facility] ENDOS[facility];  Service: Endoscopy;  Laterality: N/A;   ESOPHAGOGASTRODUODENOSCOPY (EGD) WITH PROPOFOL N/A 02/07/2018   Procedure: ESOPHAGOGASTRODUODENOSCOPY (EGD) WITH PROPOFOL;  Surgeon: [name], MD;  Location: [facility] ENDOSCOPY;  Service: Gastroenterology;  Laterality: N/A;  APC and clips placed   EYE SURGERY Bilateral    laser surgery   GIVENS CAPSULE STUDY N/A 12/19/2019   Procedure: GIVENS CAPSULE STUDY;  Surgeon: Irving Copas., MD;  Location: [place];  Service: Gastroenterology;  Laterality: N/A;   HOT HEMOSTASIS N/A 12/18/2019   Procedure: HOT HEMOSTASIS (ARGON PLASMA COAGULATION/BICAP);  Surgeon: Irving Copas., MD;  Location: [place];  Service: Gastroenterology;  Laterality: N/A;   HOT HEMOSTASIS Right 05/24/2020   Procedure: HOT HEMOSTASIS (ARGON PLASMA COAGULATION/BICAP);  Surgeon: Jackquline Denmark, MD;  Location: [facility] ENDOSCOPY;  Service: Endoscopy;  Laterality: Right;  r colon   INTRAMEDULLARY (IM) NAIL INTERTROCHANTERIC Right 12/23/2020   Procedure: INTRAMEDULLARY (IM) AFFIXUS NAIL INTERTROCHANTRIC;  Surgeon: Altamese Staunton, MD;  Location: [place];  Service: Orthopedics;  Laterality: Right;   LOWER EXTREMITY ANGIOGRAPHY Bilateral 11/30/2018   Procedure: LOWER EXTREMITY ANGIOGRAPHY;  Surgeon: [name], MD;   Location: [place] [facility];  Service: Cardiovascular;  Laterality: Bilateral;   PARS PLANA VITRECTOMY Right 05/05/2017   Procedure: PARS PLANA VITRECTOMY WITH 25 GAUGE; PARTIAL REMOVAL OF OIL; INFERIOR PERIPHERAL IRIDECTOMY, REFORM ANTERIOR CHAMBER RIGHT EYE;  Surgeon: [name], MD;  Location: [place];  Service: Ophthalmology;  Laterality: Right;   PERIPHERAL VASCULAR BALLOON ANGIOPLASTY Right 09/21/2018   Procedure: PERIPHERAL VASCULAR BALLOON ANGIOPLASTY;  Surgeon: [name], MD;  Location: [place] [facility];  Service: Cardiovascular;  Laterality: Right;  AV fistula   PERIPHERAL VASCULAR BALLOON ANGIOPLASTY Left 11/30/2018   Procedure: PERIPHERAL VASCULAR BALLOON ANGIOPLASTY;  Surgeon: [name], MD;  Location: [place] [facility];  Service: Cardiovascular;  Laterality: Left;  Left Anterior Tibial Artery   POLYPECTOMY  05/25/2020   Procedure: POLYPECTOMY;  Surgeon: Yetta Flock, MD;  Location: [place] ENDOSCOPY;  Service: Gastroenterology;;   REFRACTIVE SURGERY Bilateral    REMOVAL OF A DIALYSIS CATHETER Right 06/06/2013   Procedure: REMOVAL OF RIGHT MEDIPORT;  Surgeon: Conrad Duque, MD;  Location: [place];  Service: Vascular;  Laterality: Right;   SUBMUCOSAL TATTOO INJECTION  12/18/2019   Procedure: SUBMUCOSAL TATTOO INJECTION;  Surgeon: Irving Copas., MD;  Location: [place];  Service: Gastroenterology;;   TONSILLECTOMY     UPPER GASTROINTESTINAL ENDOSCOPY     WOUND DEBRIDEMENT Left 09/30/2019   Procedure: IRRIGATION AND DEBRIDEMENT WOUND OF LEFT BELOW KNEE AMPUTATION;  Surgeon: [name], MD;  Location: [place];  Service: Vascular;  Laterality: Left;     Social History:   reports that she has never smoked. She has never used smokeless tobacco. She reports that she does not drink alcohol and does not use drugs.   Family History:  Her family history includes Diabetes in her brother, mother, and sister; Heart attack in her brother; Hyperlipidemia in her  mother; Hypertension in her brother, father, and mother; Kidney disease in her brother. There is no history of Colon cancer, Colon polyps, Esophageal cancer, Gallbladder disease, Rectal cancer, or Stomach cancer.   Allergies Allergies  Allergen Reactions   Tape Itching and Rash    83M Transpore adhesive tape. Medical  tape pulls off the skin!! PAPER TAPE ONLY, PLEASE   Latex Hives   Oxycodone Other (See Comments)    Hallucinations    Tramadol Other (See Comments)    Hallucinations with a full tablet   Aspirin Other (See Comments)    Trouble with stomach bleeding   Morphine And Related Other (See Comments)    Hallucinations    Vicodin [Hydrocodone-Acetaminophen] Other (See Comments)    Hallucinations      Home Medications  Prior to Admission medications   Medication Sig Start Date End Date Taking? Authorizing Provider  albuterol (VENTOLIN HFA) 108 (90 Base) MCG/ACT inhaler Inhale 2 puffs into the lungs every 6 (six) hours as needed for wheezing or shortness of breath.    Yes [provider]  aspirin EC 81 MG tablet Take 81 mg by mouth daily. Swallow whole.   Yes [provider]  atropine 1 % ophthalmic solution Place 1 drop into the right eye 2 (two) times daily as needed (dry eyes).  07/04/17  Yes [provider]  BD PEN NEEDLE NANO 2ND GEN 32G X 4 MM MISC in the morning, at noon, and at bedtime.  03/03/19  Yes [provider]  darbepoetin (ARANESP) 200 MCG/0.4ML SOLN injection Inject 0.4 mLs (200 mcg total) into the vein every Wednesday with hemodialysis. 06/22/13  Yes Eulogio Bear U, DO  diclofenac sodium (VOLTAREN) 1 % GEL Apply 2 g topically 4 (four) times daily as needed (pain).  04/28/19  Yes [provider]  diphenhydrAMINE (BENADRYL) 25 MG tablet Take 25 mg by mouth See admin instructions. Take one tablet (25 mg) by mouth daily at bedtime, and take one tablet (25 mg) by mouth on Monday, Wednesday, Friday mornings before dialysis   Yes  [provider]  doxercalciferol (HECTOROL) 4 MCG/2ML injection Inject 0.5 mLs (1 mcg total) into the vein every Monday, Wednesday, and Friday with hemodialysis. 06/18/13  Yes Vann, Jessica U, DO  fluticasone (FLONASE) 50 MCG/ACT nasal spray Place 1-2 sprays into both nostrils daily. 01/11/21  Yes Wieters, Hallie C, PA-C  gabapentin (NEURONTIN) 100 MG capsule Take 100 mg by mouth in the morning.   Yes [provider]  insulin aspart (NOVOLOG) 100 UNIT/ML FlexPen Inject 6-8 Units into the skin 2 (two) times daily with a meal. 6 units in the evening, 8 units in the morning   Yes [provider]  ipratropium-albuterol (DUONEB) 0.5-2.5 (3) MG/3ML SOLN Take 3 mLs by nebulization every 6 (six) hours as needed (wheezing and shortness of breath).   Yes [provider]  lanthanum (FOSRENOL) 1000 MG chewable tablet Chew 2,000-4,000 mg by mouth See admin instructions. Chew 4 tablets (4000 mg) by mouth with each meal and 2 tablets (2000 mg) with snacks   Yes [provider]  omeprazole (PRILOSEC) 40 MG capsule Take 1 capsule (40 mg total) by mouth daily. 06/11/20  Yes Esterwood, Amy S, PA-C  ONETOUCH VERIO test strip 1 each by Other route 3 (three) times daily.  12/15/17  Yes [provider]  OXYGEN Inhale 3 L into the lungs continuous.   Yes [provider]  prednisoLONE acetate (PRED FORTE) 1 % ophthalmic suspension Place 1 drop into the right eye daily. For eye pain 04/27/17  Yes [provider]  docusate sodium (COLACE) 100 MG capsule Take 1 capsule (100 mg total) by mouth 2 (two) times daily. Patient not taking: No sig reported 12/27/20   Mariel Aloe, MD  HYDROmorphone (DILAUDID) 2 MG tablet  Take 1 tablet (2 mg total) by mouth every 6 (six) hours as needed for moderate pain or severe pain. Patient not taking: No sig reported 12/27/20   Ainsley Spinner, PA-C  ondansetron (ZOFRAN) 4 MG tablet Take 1 tablet (4 mg total) by mouth every 6 (six)  hours. Patient not taking: No sig reported 01/14/21   Lennice Sites, DO     Critical care time: 55 minutes     CRITICAL CARE Performed by: Otilio Carpen Dayten Juba   Total critical care time: 55 minutes  Critical care time was exclusive of separately billable procedures and treating other patients.  Critical care was necessary to treat or prevent imminent or life-threatening deterioration.  Critical care was time spent personally by me on the following activities: development of treatment plan with patient and/or surrogate as well as nursing, discussions with consultants, evaluation of patient's response to treatment, examination of patient, obtaining history from patient or surrogate, ordering and performing treatments and interventions, ordering and review of laboratory studies, ordering and review of radiographic studies, pulse oximetry and re-evaluation of patient's condition.   Otilio Carpen Everly Rubalcava, PA-C [facility] See Amion for pager If no response to pager , please call [phone] until 7pm After 7:00 pm call Elink  [phone]

## 2021-02-26 NOTE — Progress Notes (Signed)
RT attempted ABG multiple times with no success. RN and MD made aware.

## 2021-02-26 NOTE — Consult Note (Addendum)
Renal Service Consult Note [facility]  [name] 02/20/2021 [name], MD Requesting Physician: [name] MD  Reason for Consult: ESRD pt w/ malfunctioning access HPI: The patient is a 70 y.o. year-old w/ hx of anemia, ESRD on HD, HTN, PAD sp bilat AKA presented w/ gen malaise and weakness. She did not get HD yesterday, they "stuck the fistula multiple times" without success.  She did get HD last Friday. She comes to the ED w/ her sister.  Asked to see for ESRD.    Sister gives all hx.  Sister lives w/ the patient, family takes care of her, she is pretty much total care.  Bilat AKA. She "knows her people" but doesn't track the dates, year, etc.  Per the sister the patietn is typically very sleepy like she is today, this is her baseline. Has been on HD for 7-8 yrs. Was in hospital recently for hip fracture and repair.   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    Esophageal ulcer with bleeding    ESRD (end stage renal disease) ([place])    "suppose to start dialysis today" (06/15/2013)   Family history of adverse reaction to anesthesia    It took a while for pt sister to wake from anesthesia   GERD (gastroesophageal reflux disease)    GI bleed    Glaucoma    blind in both eyes   Heart murmur    Mild AS, moderate MR, moderate TR 10/30/16 echo   Hx of adenomatous colonic polyps 04/07/2018   Hypertension    Myalgia 12/31/2011   Neuropathy 12/31/2011   PAD (peripheral artery disease) ([place])    nonviable tissue left lower extremity   Shortness of breath    "when she doesn't go to dialysis"    Type II diabetes mellitus ([place])    Type II   Past Surgical History  Past Surgical History:  Procedure Laterality Date   A/V FISTULAGRAM N/A 09/21/2018   Procedure: A/V FISTULAGRAM - Right Upper;  Surgeon: [name], MD;  Location: [place] CV LAB;  Service: Cardiovascular;  Laterality: N/A;   ABDOMINAL AORTOGRAM N/A 11/30/2018   Procedure: ABDOMINAL AORTOGRAM;  Surgeon: [name], MD;  Location: [place] [facility];  Service: Cardiovascular;  Laterality: N/A;   ABDOMINAL AORTOGRAM W/LOWER EXTREMITY Left 07/29/2019   Procedure: ABDOMINAL AORTOGRAM W/LOWER EXTREMITY;  Surgeon: [name], MD;  Location: [place] [facility];  Service: Cardiovascular;  Laterality: Left;   ABDOMINAL HYSTERECTOMY     partial   AMPUTATION Left 08/30/2019   Procedure: AMPUTATION BELOW KNEE LEFT;  Surgeon: [name], MD;  Location: [facility] OR;  Service: Vascular;  Laterality: Left;   AMPUTATION Left 10/21/2019   Procedure: Amputation Above Knee;  Surgeon: Rosetta Posner, MD;  Location: [facility] OR;  Service: Vascular;  Laterality: Left;   AMPUTATION Right 11/17/2019   Procedure: AMPUTATION ABOVE KNEE, right leg;  Surgeon: Rosetta Posner, MD;  Location: [place];  Service: Vascular;  Laterality: Right;   AV FISTULA PLACEMENT Right 06/06/2013   Procedure: ARTERIOVENOUS (AV) FISTULA CREATION-RIGHT BRACHIAL CEPHALIC;  Surgeon: Conrad Maybrook, MD;  Location: [place];  Service: Vascular;  Laterality: Right;   BREAST BIOPSY Left    BREAST LUMPECTOMY Left    "and took out some lymph nodes" (06/15/2013)   CARDIAC CATHETERIZATION     04/02/15 [facility]): no angiographic CAD, LVEF 40% with global hypokinesis (LHC done for + stress echo, EF 45% 11/28/14)   CARPAL TUNNEL RELEASE Left 11/26/2017   Procedure: LEFT CARPAL TUNNEL RELEASE;  Surgeon: [name], MD;  Location: [place];  Service: Orthopedics;  Laterality: Left;   CATARACT EXTRACTION W/ ANTERIOR VITRECTOMY Bilateral    CESAREAN SECTION  1980   COLONOSCOPY      COLONOSCOPY WITH PROPOFOL N/A 05/25/2020   Procedure: COLONOSCOPY WITH PROPOFOL;  Surgeon: Yetta Flock, MD;  Location: [place];  Service: Gastroenterology;  Laterality: N/A;   ENTEROSCOPY N/A 12/18/2019   Procedure: ENTEROSCOPY;  Surgeon: Rush Landmark Telford Nab., MD;  Location: [place];  Service: Gastroenterology;  Laterality: N/A;   ENTEROSCOPY N/A 05/24/2020   Procedure: ENTEROSCOPY;  Surgeon: Jackquline Denmark, MD;  Location: [facility] ENDOSCOPY;  Service: Endoscopy;  Laterality: N/A;   ESOPHAGOGASTRODUODENOSCOPY N/A 07/08/2017   Procedure: ESOPHAGOGASTRODUODENOSCOPY (EGD);  Surgeon: [name], MD;  Location: [facility] ENDOSCOPY;  Service: Endoscopy;  Laterality: N/A;   ESOPHAGOGASTRODUODENOSCOPY (EGD) WITH PROPOFOL N/A 02/07/2018   Procedure: ESOPHAGOGASTRODUODENOSCOPY (EGD) WITH PROPOFOL;  Surgeon: [name], MD;  [facility];  Service: Gastroenterology;  Laterality: N/A;  APC and clips placed   EYE SURGERY Bilateral    laser surgery   GIVENS CAPSULE STUDY N/A 12/19/2019   Procedure: GIVENS CAPSULE STUDY;  Surgeon: Irving Copas., MD;  Location: [place];  Service: Gastroenterology;  Laterality: N/A;   HOT HEMOSTASIS N/A 12/18/2019   Procedure: HOT HEMOSTASIS (ARGON PLASMA COAGULATION/BICAP);  Surgeon: Irving Copas., MD;  Location: [place];  Service: Gastroenterology;  Laterality: N/A;   HOT HEMOSTASIS Right 05/24/2020   Procedure: HOT HEMOSTASIS (ARGON PLASMA COAGULATION/BICAP);  Surgeon: Jackquline Denmark, MD;  Location: [facility] ENDOSCOPY;  Service: Endoscopy;  Laterality: Right;  r colon   INTRAMEDULLARY (IM) NAIL INTERTROCHANTERIC Right 12/23/2020   Procedure: INTRAMEDULLARY (IM) AFFIXUS NAIL INTERTROCHANTRIC;  Surgeon: Altamese Vineyard Haven, MD;  Location: [place];  Service: Orthopedics;  Laterality: Right;   LOWER EXTREMITY ANGIOGRAPHY Bilateral 11/30/2018   Procedure: LOWER EXTREMITY ANGIOGRAPHY;  Surgeon: [name], MD;  Location: [place] CV LAB;   Service: Cardiovascular;  Laterality: Bilateral;   PARS PLANA VITRECTOMY Right 05/05/2017   Procedure: PARS PLANA VITRECTOMY WITH 25 GAUGE; PARTIAL REMOVAL OF OIL; INFERIOR PERIPHERAL IRIDECTOMY, REFORM ANTERIOR CHAMBER RIGHT EYE;  Surgeon: [name], MD;  Location: [place];  Service: Ophthalmology;  Laterality: Right;   PERIPHERAL VASCULAR BALLOON ANGIOPLASTY Right 09/21/2018   Procedure: PERIPHERAL VASCULAR BALLOON ANGIOPLASTY;  Surgeon: [name], MD;  Location: [place] CV LAB;  Service: Cardiovascular;  Laterality: Right;  AV fistula   PERIPHERAL VASCULAR BALLOON ANGIOPLASTY Left 11/30/2018   Procedure: PERIPHERAL VASCULAR BALLOON ANGIOPLASTY;  Surgeon: [name], MD;  Location: [place] CV LAB;  Service: Cardiovascular;  Laterality: Left;  Left Anterior Tibial Artery   POLYPECTOMY  05/25/2020   Procedure: POLYPECTOMY;  Surgeon: Yetta Flock, MD;  Location: [place] ENDOSCOPY;  Service: Gastroenterology;;   REFRACTIVE SURGERY Bilateral    REMOVAL OF A DIALYSIS CATHETER Right 06/06/2013   Procedure: REMOVAL OF  RIGHT MEDIPORT;  Surgeon: Conrad Kinder, MD;  Location: [facility];  Service: Vascular;  Laterality: Right;   SUBMUCOSAL TATTOO INJECTION  12/18/2019   Procedure: SUBMUCOSAL TATTOO INJECTION;  Surgeon: Irving Copas., MD;  Location: [facility] ENDOSCOPY;  Service: Gastroenterology;;   TONSILLECTOMY     UPPER GASTROINTESTINAL ENDOSCOPY     WOUND DEBRIDEMENT Left 09/30/2019   Procedure: IRRIGATION AND DEBRIDEMENT WOUND OF LEFT BELOW KNEE AMPUTATION;  Surgeon: [name], MD;  Location: [place];  Service: Vascular;  Laterality: Left;   Family History  Family History  Problem Relation Age of Onset   Diabetes Mother    Hyperlipidemia Mother    Hypertension Mother    Hypertension Father    Diabetes Sister    Diabetes Brother    Hypertension Brother    Heart attack Brother    Kidney disease Brother    Colon cancer Neg Hx    Colon polyps Neg Hx    Esophageal  cancer Neg Hx    Gallbladder disease Neg Hx    Rectal cancer Neg Hx    Stomach cancer Neg Hx    Social History  reports that she has never smoked. She has never used smokeless tobacco. She reports that she does not drink alcohol and does not use drugs. Allergies  Allergies  Allergen Reactions   Tape Itching and Rash    41M Transpore adhesive tape. Medical tape pulls off the skin!! PAPER TAPE ONLY, PLEASE   Latex Hives   Oxycodone Other (See Comments)    Hallucinations    Tramadol Other (See Comments)    Hallucinations with a full tablet   Aspirin Other (See Comments)    Trouble with stomach bleeding   Morphine And Related Other (See Comments)    Hallucinations    Vicodin [Hydrocodone-Acetaminophen] Other (See Comments)    Hallucinations    Home medications Prior to Admission medications   Medication Sig Start Date End Date Taking? Authorizing Provider  albuterol (VENTOLIN HFA) 108 (90 Base) MCG/ACT inhaler Inhale 2 puffs into the lungs every 6 (six) hours as needed for wheezing or shortness of breath.    Yes [provider]  aspirin EC 81 MG tablet Take 81 mg by mouth daily. Swallow whole.   Yes [provider]  atropine 1 % ophthalmic solution Place 1 drop into the right eye 2 (two) times daily as needed (dry eyes).  07/04/17  Yes [provider]  BD PEN NEEDLE NANO 2ND GEN 32G X 4 MM MISC in the morning, at noon, and at bedtime.  03/03/19  Yes [provider]  darbepoetin (ARANESP) 200 MCG/0.4ML SOLN injection Inject 0.4 mLs (200 mcg total) into the vein every Wednesday with hemodialysis. 06/22/13  Yes Eulogio Bear U, DO  diclofenac sodium (VOLTAREN) 1 % GEL Apply 2 g topically 4 (four) times daily as needed (pain).  04/28/19  Yes [provider]  diphenhydrAMINE (BENADRYL) 25 MG tablet Take 25 mg by mouth See admin instructions. Take one tablet (25 mg) by mouth daily at bedtime, and take one tablet (25 mg) by mouth on Monday, Wednesday,  Friday mornings before dialysis   Yes [provider]  doxercalciferol (HECTOROL) 4 MCG/2ML injection Inject 0.5 mLs (1 mcg total) into the vein every Monday, Wednesday, and Friday with hemodialysis. 06/18/13  Yes Vann, Jessica U, DO  fluticasone (FLONASE) 50 MCG/ACT nasal spray Place 1-2 sprays into both nostrils daily. 01/11/21  Yes Wieters, Hallie C, PA-C  gabapentin (NEURONTIN) 100 MG  capsule Take 100 mg by mouth in the morning.   Yes [provider]  insulin aspart (NOVOLOG) 100 UNIT/ML FlexPen Inject 6-8 Units into the skin 2 (two) times daily with a meal. 6 units in the evening, 8 units in the morning   Yes [provider]  ipratropium-albuterol (DUONEB) 0.5-2.5 (3) MG/3ML SOLN Take 3 mLs by nebulization every 6 (six) hours as needed (wheezing and shortness of breath).   Yes [provider]  lanthanum (FOSRENOL) 1000 MG chewable tablet Chew 2,000-4,000 mg by mouth See admin instructions. Chew 4 tablets (4000 mg) by mouth with each meal and 2 tablets (2000 mg) with snacks   Yes [provider]  omeprazole (PRILOSEC) 40 MG capsule Take 1 capsule (40 mg total) by mouth daily. 06/11/20  Yes Esterwood, Amy S, PA-C  ONETOUCH VERIO test strip 1 each by Other route 3 (three) times daily.  12/15/17  Yes [provider]  OXYGEN Inhale 3 L into the lungs continuous.   Yes [provider]  prednisoLONE acetate (PRED FORTE) 1 % ophthalmic suspension Place 1 drop into the right eye daily. For eye pain 04/27/17  Yes [provider]  docusate sodium (COLACE) 100 MG capsule Take 1 capsule (100 mg total) by mouth 2 (two) times daily. Patient not taking: No sig reported 12/27/20   Mariel Aloe, MD  HYDROmorphone (DILAUDID) 2 MG tablet Take 1 tablet (2 mg total) by mouth every 6 (six) hours as needed for moderate pain or severe pain. Patient not taking: No sig reported 12/27/20   Ainsley Spinner, PA-C  ondansetron (ZOFRAN) 4 MG tablet Take 1 tablet (4  mg total) by mouth every 6 (six) hours. Patient not taking: No sig reported 01/14/21   Lennice Sites, DO     Vitals:   03/05/2021 1407 03/06/2021 1435 03/04/2021 1500 03/18/2021 1530  BP: (!) 83/42 (!) 83/31 (!) 91/53 (!) 70/53  Pulse: 87 88 98 91  Resp: 17 17 15 17   Temp:      TempSrc:      SpO2: 98% 99% 99% 99%   Exam Gen alert, no distress No rash, cyanosis or gangrene Sclera anicteric, throat clear  No jvd or bruits Chest clear bilat to bases, no rales/ wheezing RRR no MRG Abd soft ntnd no mass or ascites +bs GU normal MS bilat AKA Ext no stump edema, no wounds or ulcers Neuro is lethargic, arouses to loud voice and answers w/ 1-2 word responses  RUA AVF has +bruit proximal towards the antecubital space      Home meds include asa, neurontin 100 am, fosrenol 1gm 4 w/ meals and 2 w/ snacks, prilosec, O2 3L, po dilaudid prn, eyedrops, prns     OP HD: MWF    4h  400/500   66.5kg   2/2 bath  AVF RUA  Hep none  - hect 2 ug tiw  - venofer 50/ wk  - mircera 225 q 2 wks, last ?   Assessment/ Plan: AVF malfunction - unable to stick AVF at OP HD on Monday 8/08.  Came to ED. There is a slight thrill.  Will consult IR for fistulogram w/ declot vs new HD cath.  ESRD - on HD MWF.  Last HD 8/05. No sig vol or lab issues. HD prob tomorrow after access established.  PAD sp bilat AKA AMS - this is baseline per the sister, will recognize family members and make some small level of conversation. Is usually otherwise very lethargic.  MBD  ckd - cont binder, get records Anemia ckd - Hb 11 here, no esa needed now , get records BP/ volume - BP's have been on the soft side here.  Pulm edema - by CXR, pt doesn't appear symptomatic.  Plan HD tomorrow when access established.       Rob Reem Fleury  MD 03/16/2021, 4:02 PM  Recent Labs  Lab 02/24/21 1549 02/19/2021 0955 03/07/2021 1024 03/07/2021 1535  WBC 8.0 11.7*  --   --   HGB 9.2* 9.7* 11.6* 11.2*   Recent Labs  Lab 02/24/21 1549  03/14/2021 0955 03/07/2021 1024 02/23/2021 1535  K 3.2* 3.9 3.7 3.8  BUN 26* 39* 38*  --   CREATININE 7.82* 9.37* 9.30*  --   CALCIUM 9.0 8.7*  --   --

## 2021-02-26 NOTE — Progress Notes (Signed)
Patient became unresponsive, Heart rate dropped from the 120's-130's sinus tach to the 80's. Difficulty obtaining blood pressure and SpO2 reading while on 60 of Levophed and 0.03 of Vasopressin. Patient's respiratory rate slowed, gasping apneic breaths. Patient placed on Non re-breather mask. Respiratory called, and Elink MD was notified and videoed into the room.  Patient went into PEA. Patient's sister at the bedside at the time. Patient's sister asked for the oxygen mask to be removed. Time of death was later called at 66.

## 2021-02-26 NOTE — ED Notes (Signed)
MD notified of pt lactic acid 3.5.

## 2021-02-26 NOTE — Progress Notes (Signed)
eLink Physician-Brief Progress Note Patient Name: DEAJA RIZO DOB: 1950/10/30 MRN: [id number]   Date of Service  02/24/2021  HPI/Events of Note  12 Lead EKG reveals sinus tachycardia (HR = 123) ST & T wave abnormality, consider inferolateral ischemia. As above, can't B-Block.  eICU Interventions  Continue present management.     Intervention Category Major Interventions: Other:  Lysle Dingwall 03/10/2021, 10:23 PM

## 2021-02-26 NOTE — Progress Notes (Signed)
PCCM INTERVAL PROGRESS NOTE   Asked to evaluate for HD cath placement  On ultrasound, right internal jugular vein was not compressible and contained hyperechoic material. Concern for thrombus.   Plan: - order formal doppler study - May need anticoagulation depending on result    Georgann Housekeeper, AGACNP-BC [place] for personal pager PCCM on call pager [phone] until 7pm. Please call Elink 7p-7a. [phone]  02/21/2021 8:00 PM

## 2021-02-26 NOTE — Progress Notes (Signed)
   03/02/2021 2228  Clinical Encounter Type  Visited With Patient and family together  Visit Type Patient actively dying;Initial  Referral From Nurse  Consult/Referral To Chaplain  Spiritual Encounters  Spiritual Needs Grief support   Chaplain responded to page for Pt actively dying. Pt's sister, [name], was at bedside and Pt's daughter and granddaughter arrived a few minutes later. Chaplain engaged active listening as Pt's sister her concerns. Chaplain provided emotional and grief support. Chaplain informed Pt's sister how to reach chaplain if they would like additional spiritual care. Chaplain remains available.  This note was prepared by Chaplain Resident, Dante Gang, MDiv. Chaplain remains available as needed through the on-call pager: [phone].

## 2021-02-26 NOTE — ED Notes (Signed)
Attempted IV twice with no success. IV team consult placed.  

## 2021-02-26 NOTE — ED Notes (Signed)
IV team placed and IV but unable to draw blood cultures and lactic acid. 1st set of blood cultures drawn and sent. Will notify phlebotomist to try lab draw.

## 2021-02-26 NOTE — ED Notes (Signed)
MD notified of pt bp. 500cc NS bolus initiated.

## 2021-02-26 NOTE — Progress Notes (Signed)
Pharmacy Antibiotic Note  [name] is a 70 y.o. female admitted on 02/25/2021 with sepsis.  Pharmacy has been consulted for vancomycin dosing. Patient with a history of of anemia, ESRD on HD MWF, HTN, PAD sp bilat AKA presented w/ gen malaise and weakness.   Patient missed HD session yesterday 2/2 to malfunctioning access. IR consult for for fistulogram w/ declot vs new HD cath per nephrology note. Planning for HD 8/10 pending access.  Cefepime to be added with ongoing shock. Will give cefepime 1g qHS rather than qHD until schedule established.  Plan: Cefepime 2g x1 then 1g qHS Follow RRT plans     Temp (24hrs), Avg:98.2 F (36.8 C), Min:97.8 F (36.6 C), Max:98.8 F (37.1 C)  Recent Labs  Lab 02/24/21 1549 03/16/2021 0955 02/21/2021 1024 03/17/2021 1409  WBC 8.0 11.7*  --   --   CREATININE 7.82* 9.37* 9.30*  --   LATICACIDVEN 2.1*  --   --  3.5*     CrCl cannot be calculated (Unknown ideal weight.).    Allergies  Allergen Reactions   Tape Itching and Rash    60M Transpore adhesive tape. Medical tape pulls off the skin!! PAPER TAPE ONLY, PLEASE   Latex Hives   Oxycodone Other (See Comments)    Hallucinations    Tramadol Other (See Comments)    Hallucinations with a full tablet   Aspirin Other (See Comments)    Trouble with stomach bleeding   Morphine And Related Other (See Comments)    Hallucinations    Vicodin [Hydrocodone-Acetaminophen] Other (See Comments)    Hallucinations    Antimicrobials this admission: Zosyn given once in ED Vancomycin 8/9 >>   Microbiology results: Pending  Thank you for allowing pharmacy to be a part of this patient's care.  Arrie Senate, PharmD, BCPS, [facility] Clinical Pharmacist [phone] Please check AMION for all [place] numbers 03/03/2021

## 2021-02-26 NOTE — ED Triage Notes (Signed)
Pt here with family with reports of dialysis being unable to use dialysis fistula. Pt last full dialysis was on August 1st. Pt family also states pt has been weak and had a dry cough. Pt hypotensive and hypoxic in triage. Placed on 2L [place].

## 2021-02-26 NOTE — Progress Notes (Signed)
eLink Physician-Brief Progress Note Patient Name: [name] DOB: 1950/12/04 MRN: [id number]   Date of Service  03/18/2021  HPI/Events of Note  Called d/t Troponin = 456 and Lactic Acid = 3.8. Patient is allergic to ASA. Patient is on Vasopressors, therefore, can't use B-Blockers. Will hold off on Heparin IV infusion d/t low platelets pending EKG and Troponin results.   eICU Interventions  Plan: Continue to trend Troponin. 12 Lead EKG STAT.     Intervention Category Major Interventions: Other:;Acid-Base disturbance - evaluation and management  Naleigha Raimondi Cornelia Copa 03/19/2021, 9:25 PM

## 2021-03-01 LAB — CULTURE, BLOOD (ROUTINE X 2)

## 2021-03-02 LAB — CULTURE, BLOOD (ROUTINE X 2): Special Requests: ADEQUATE

## 2021-03-05 ENCOUNTER — Other Ambulatory Visit: Payer: Self-pay | Admitting: *Deleted

## 2021-03-05 NOTE — Patient Outreach (Signed)
[facility]) Care Management  03/05/2021  [name] [birth year] [id number]   Pt deceased.  [name], RN Care Management Coordinator [place] Office [phone]

## 2021-03-07 ENCOUNTER — Ambulatory Visit: Payer: Medicare Other | Admitting: *Deleted

## 2021-03-21 NOTE — Death Summary Note (Addendum)
DEATH SUMMARY   Patient Details  Name: [name] MRN: [id number] DOB: 1950/12/19  Admission/Discharge Information   Admit Date:  03/05/2021  Date of Death: Date of Death: 03-05-2021  Time of Death: Time of Death: 10-18-2255  Length of Stay: 1  Referring Physician: Prince Solian, MD   Reason(s) for Hospitalization  Septic shock  Diagnoses  Preliminary cause of death: shock Secondary Diagnoses (including complications and co-morbidities):  Active Problems:   ESRD (end stage renal disease) ([place])   Encephalopathy   Shock ([place]) Septic shock due to GNR bacteremia ESRD Noncompliance with dialysis Hypocalcemia Chronic HFrEF NSTEMI Hypocalcemia Lactic acidoiss 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 year old female with past medical history of ESRD, PAD, diabetes, hypertension, UGI bleed who presented with weakness and hypotension.  She has had a poorly functioning R upper extremity fistula for several weeks.  They were not able to access it at all during her normal scheduled dialysis on 8/8 and she was referred to a vascular office.  She presented there today and was referred to the ED for hypotension.  Her initial blood pressure was 87/32, she was given 1.5 L of IV fluid with minimal improvement.  Labs were significant for a lactic acid of 3.5, BNP greater than 4500, bicarb 18, anion gap 23, white blood cell count 11 K.  CXR with edema versus infiltrates.  She was seen by nephrology and plan was for admission with IR consult for fistulogram with declot versus new HD cath.  However, during her ED course she became increasingly hypotensive and required pressor support.  Therefore PCCM consulted for admission.  She was started on  broad-spectrum antibiotics with vancomycin and Zosyn.  She has had intermittent somnolence in the ED, CT head without acute findings.   Her sister is at the bedside and supplements history with worsening baseline health problems including refusal to go to HD, decreased appetite, recent right hip fracture after a fall along with intermittent confusion.  She notes diarrhea 2 days ago along with nonproductive cough and no fever.  She was treated with antibiotics and vasopressors. Her family visitation was liberalized during her short admission due to concern for high risk for decompensation overnight. She unfortunately went into worsening shock and PEA arrest overnight. Although she had elevated troponin and there was concern for possible ischemia, she was not able to use heparin due to thrombocytopenia or Bblocker due to shock. She expired with family at bedside.    Pertinent Labs and Studies  Significant Diagnostic Studies CT ABDOMEN PELVIS WO CONTRAST  Result Date: 02/24/2021 CLINICAL DATA:  Abdominal distension, diarrhea, occasional abdominal pain, dialysis, missed dialysis Friday EXAM: CT ABDOMEN AND PELVIS WITHOUT CONTRAST TECHNIQUE: Multidetector CT imaging of the abdomen and pelvis was performed following the standard protocol without IV contrast. COMPARISON:  11/22/2020 FINDINGS: Lower chest: Cardiomegaly. Extensive 3 vessel coronary artery calcifications and mitral annulus calcifications. Aortic valve calcifications versus surgical prosthesis, not well imaged. Small bilateral pleural effusions and associated atelectasis or consolidation. Interlobular septal thickening and ground-glass airspace opacity throughout the lung bases. Hepatobiliary: No solid liver abnormality is seen. No gallstones, gallbladder wall thickening, or biliary dilatation. Pancreas: Unremarkable. No pancreatic ductal dilatation or surrounding inflammatory changes. Spleen: Normal in size without significant abnormality.  Adrenals/Urinary Tract: Adrenal glands are unremarkable. Atrophic kidneys. No  hydronephrosis. Thickening of the decompressed urinary bladder. Stomach/Bowel: Stomach is within normal limits. Appendix is not clearly visualized. No evidence of bowel wall thickening, distention, or inflammatory changes. Vascular/Lymphatic: Aortic atherosclerosis and pipe like vascular calcinosis. No enlarged abdominal or pelvic lymph nodes. Reproductive: Status post hysterectomy. Other: No abdominal wall hernia or abnormality. Small volume ascites throughout the abdomen and pelvis. Musculoskeletal: No acute or significant osseous findings. Renal osteodystrophy. IMPRESSION: 1. No specific non-contrast CT findings of the abdomen or pelvis to explain abdominal pain or diarrhea. 2. Small bilateral pleural effusions and associated atelectasis or consolidation. Interlobular septal thickening and ground-glass airspace opacity throughout the lung bases, most consistent with pulmonary edema. 3. Small volume ascites throughout the abdomen and pelvis. 4. Cardiomegaly and coronary artery disease. Aortic Atherosclerosis (ICD10-I70.0). Electronically Signed   By: Eddie Candle M.D.   On: 02/24/2021 17:48   DG Chest 2 View  Result Date: 03/19/2021 CLINICAL DATA:  Shortness of breath. EXAM: CHEST - 2 VIEW COMPARISON:  Prior chest radiographs 02/24/2021 and earlier. FINDINGS: Shallow inspiration radiograph. Tortuous and calcified thoracic aorta. Redemonstrated cardiomegaly with pulmonary vascular congestion and interstitial edema. Superimposed patchy opacities within the mid to lower lung fields bilaterally, slightly progressed from the chest radiograph of 02/24/2021. Trace bilateral pleural effusions. No definite left pleural effusion. No evidence of pneumothorax. No acute bony abnormality identified. IMPRESSION: Shallow inspiration radiograph. Redemonstrated cardiomegaly with pulmonary vascular congestion and interstitial edema. Superimposed patchy  opacities within the mid-to-lower lung fields bilaterally, slightly progressed from the chest radiograph of 02/24/2021. This may reflect pulmonary edema or pneumonia. Clinical correlation is recommended. Trace bilateral pleural effusions. Tortuous and calcified thoracic aorta. Electronically Signed   By: Kellie Simmering DO   On: 03/14/2021 11:28   CT HEAD WO CONTRAST (5MM)  Result Date: 03/15/2021 CLINICAL DATA:  Mental status change. EXAM: CT HEAD WITHOUT CONTRAST TECHNIQUE: Contiguous axial images were obtained from the base of the skull through the vertex without intravenous contrast. COMPARISON:  None. FINDINGS: Brain: No acute intracranial hemorrhage. No focal mass lesion. No CT evidence of acute infarction. No midline shift or mass effect. No hydrocephalus. Basilar cisterns are patent. There are periventricular and subcortical white matter hypodensities. Generalized cortical atrophy. Vascular: No hyperdense vessel or unexpected calcification. Skull: Normal. Negative for fracture or focal lesion. Sinuses/Orbits: Paranasal sinuses and mastoid air cells are clear. Orbits are clear. Other: None. IMPRESSION: 1. No acute intracranial findings. 2. Atrophy and white matter microvascular disease Electronically Signed   By: Suzy Bouchard M.D.   On: 03/20/2021 17:19   DG CHEST PORT 1 VIEW  Result Date: 02/23/2021 CLINICAL DATA:  Central venous catheter in place. EXAM: PORTABLE CHEST 1 VIEW COMPARISON:  Radiograph earlier today. FINDINGS: Dual lumen left-sided dialysis catheter tip overlies the upper SVC. There is no pneumothorax. Stable cardiomegaly. Unchanged mediastinal contours. Advanced aortic atherosclerosis. Similar interstitial edema. Bilateral pleural effusions, right greater than left. Similar patchy opacities in the right mid lung and left lung base. IMPRESSION: 1. Left-sided dialysis catheter with tip over the upper SVC. No pneumothorax. 2. Stable cardiomegaly, pulmonary edema, and pleural effusions,  right greater than left. 3. Patchy opacities in the right mid lung and left lung base are unchanged from prior, asymmetric edema or pneumonia. Electronically Signed   By: Keith Rake M.D.   On: 03/09/2021 20:14   DG Chest Port 1 View  Result Date: 02/24/2021 CLINICAL DATA:  Hypoxia EXAM: PORTABLE CHEST 1 VIEW COMPARISON:  12/16/2019 FINDINGS: Cardiomegaly. Worsening bilateral perihilar and lower lobe airspace  disease, likely edema. No effusions. No acute bony abnormality. Diffuse aortic calcifications. No visible aneurysm. IMPRESSION: Worsening bilateral airspace disease compatible with edema/CHF. Electronically Signed   By: Rolm Baptise M.D.   On: 02/24/2021 19:14    Microbiology Recent Results (from the past 240 hour(s))  Resp Panel by RT-PCR (Flu A&B, Covid) Nasopharyngeal Swab     Status: None   Collection Time: 03/10/2021  2:48 PM   Specimen: Nasopharyngeal Swab; Nasopharyngeal(NP) swabs in vial transport medium  Result Value Ref Range Status   SARS Coronavirus 2 by RT PCR NEGATIVE NEGATIVE Final    Comment: (NOTE) SARS-CoV-2 target nucleic acids are NOT DETECTED.  The SARS-CoV-2 RNA is generally detectable in upper respiratory specimens during the acute phase of infection. The lowest concentration of SARS-CoV-2 viral copies this assay can detect is 138 copies/mL. A negative result does not preclude SARS-Cov-2 infection and should not be used as the sole basis for treatment or other patient management decisions. A negative result may occur with  improper specimen collection/handling, submission of specimen other than nasopharyngeal swab, presence of viral mutation(s) within the areas targeted by this assay, and inadequate number of viral copies(<138 copies/mL). A negative result must be combined with clinical observations, patient history, and epidemiological information. The expected result is Negative.  Fact Sheet for Patients:  EntrepreneurPulse.com.au  Fact  Sheet for Healthcare Providers:  IncredibleEmployment.be  This test is no t yet approved or cleared by the [place] FDA and  has been authorized for detection and/or diagnosis of SARS-CoV-2 by FDA under an Emergency Use Authorization (EUA). This EUA will remain  in effect (meaning this test can be used) for the duration of the COVID-19 declaration under Section 564(b)(1) of the Act, 21 U.S.C.section 360bbb-3(b)(1), unless the authorization is terminated  or revoked sooner.       Influenza A by PCR NEGATIVE NEGATIVE Final   Influenza B by PCR NEGATIVE NEGATIVE Final    Comment: (NOTE) The Xpert Xpress SARS-CoV-2/FLU/RSV plus assay is intended as an aid in the diagnosis of influenza from Nasopharyngeal swab specimens and should not be used as a sole basis for treatment. Nasal washings and aspirates are unacceptable for Xpert Xpress SARS-CoV-2/FLU/RSV testing.  Fact Sheet for Patients: EntrepreneurPulse.com.au  Fact Sheet for Healthcare Providers: IncredibleEmployment.be  This test is not yet approved or cleared by the [place] FDA and has been authorized for detection and/or diagnosis of SARS-CoV-2 by FDA under an Emergency Use Authorization (EUA). This EUA will remain in effect (meaning this test can be used) for the duration of the COVID-19 declaration under Section 564(b)(1) of the Act, 21 U.S.C. section 360bbb-3(b)(1), unless the authorization is terminated or revoked.  Performed at [facility], [place] [address] [place], [place] [address]   MRSA Next Gen by PCR, Nasal     Status: Abnormal   Collection Time: 03/07/2021  7:56 PM   Specimen: Nasal Mucosa; Nasal Swab  Result Value Ref Range Status   MRSA by PCR Next Gen DETECTED (A) NOT DETECTED Final    Comment: RESULT CALLED TO, READ BACK BY AND VERIFIED WITH: PATIENT DISCHARGED OR EXPIRED (NOTE) The GeneXpert MRSA Assay (FDA approved for NASAL specimens  only), is one component of a comprehensive MRSA colonization surveillance program. It is not intended to diagnose MRSA infection nor to guide or monitor treatment for MRSA infections. Test performance is not FDA approved in patients less than 64 years old. Performed at [facility], [place],  [place] [address]     Lab Basic Metabolic Panel: Recent Labs  Lab 02/24/21 1549 03/12/2021 0955 03/03/2021 1024 02/28/2021 1535 03/17/2021 1857 03/13/2021 2152  NA 137 138 136 139 139  --   K 3.2* 3.9 3.7 3.8 4.1  --   CL 95* 97* 100  --  97*  --   CO2 27 18*  --   --  24  --   GLUCOSE 119* 87 84  --  117*  --   BUN 26* 39* 38*  --  41*  --   CREATININE 7.82* 9.37* 9.30*  --  9.25* 9.45*  CALCIUM 9.0 8.7*  --   --  8.4*  --   MG 2.0  --   --   --   --   --    Liver Function Tests: Recent Labs  Lab 02/24/21 1549 03/01/2021 1857  AST 34 57*  ALT 28 36  ALKPHOS 90 83  BILITOT 3.8* 4.9*  PROT 5.5* 5.6*  ALBUMIN 2.5* 2.3*   No results for input(s): LIPASE, AMYLASE in the last 168 hours. No results for input(s): AMMONIA in the last 168 hours. CBC: Recent Labs  Lab 02/24/21 1549 02/28/2021 0955 03/09/2021 1024 02/24/2021 1535 03/16/2021 1857  WBC 8.0 11.7*  --   --  13.7*  NEUTROABS 6.7 10.2*  --   --   --   HGB 9.2* 9.7* 11.6* 11.2* 9.5*  HCT 28.9* 29.9* 34.0* 33.0* 28.8*  MCV 102.1* 102.4*  --   --  99.7  PLT 95* 67*  --   --  77*   Cardiac Enzymes: No results for input(s): CKTOTAL, CKMB, CKMBINDEX, TROPONINI in the last 168 hours. Sepsis Labs: Recent Labs  Lab 02/24/21 1549 03/17/2021 0955 03/16/2021 1409 03/20/2021 1849 03/18/2021 1857  PROCALCITON  --   --   --  29.30  --   WBC 8.0 11.7*  --   --  13.7*  LATICACIDVEN 2.1*  --  3.5*  --  3.8*    Procedures/Operations  Trialysis placement   Julian Hy Mar 26, 2021, 1:12 PM

## 2021-03-21 DEATH — deceased

## 2021-11-21 IMAGING — DX DG KNEE COMPLETE 4+V*L*
4 series · 4 of 4 positions shown · non-contrast
Comparison: None.

CLINICAL DATA: Left below-knee amputation, soft tissue infection

EXAM:
LEFT KNEE - COMPLETE 4+ VIEW

[knee ap]
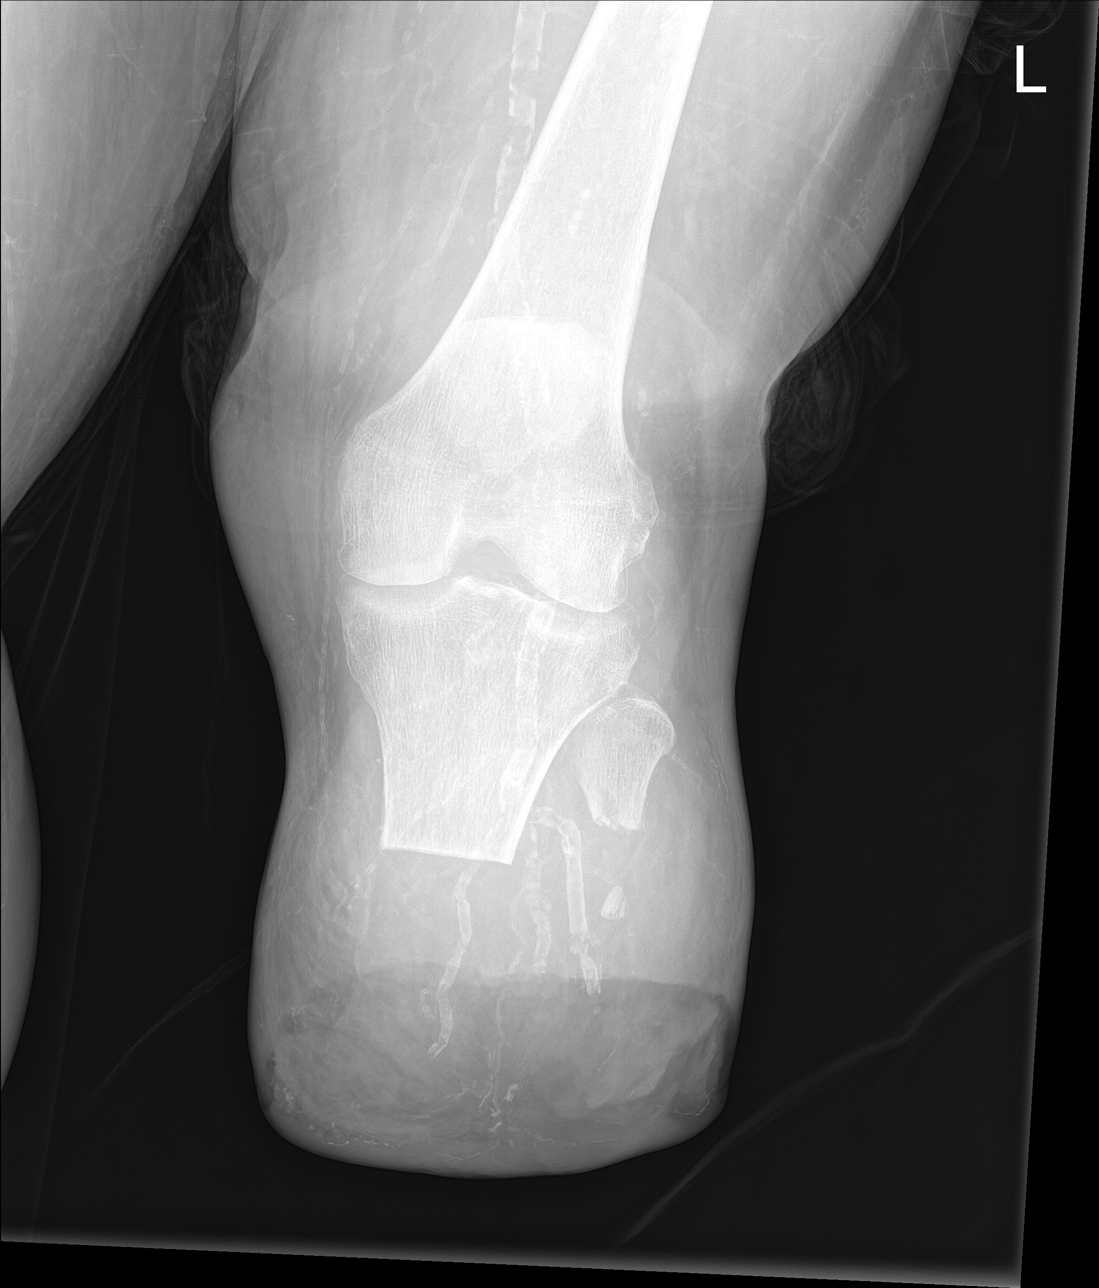

[knee lat]
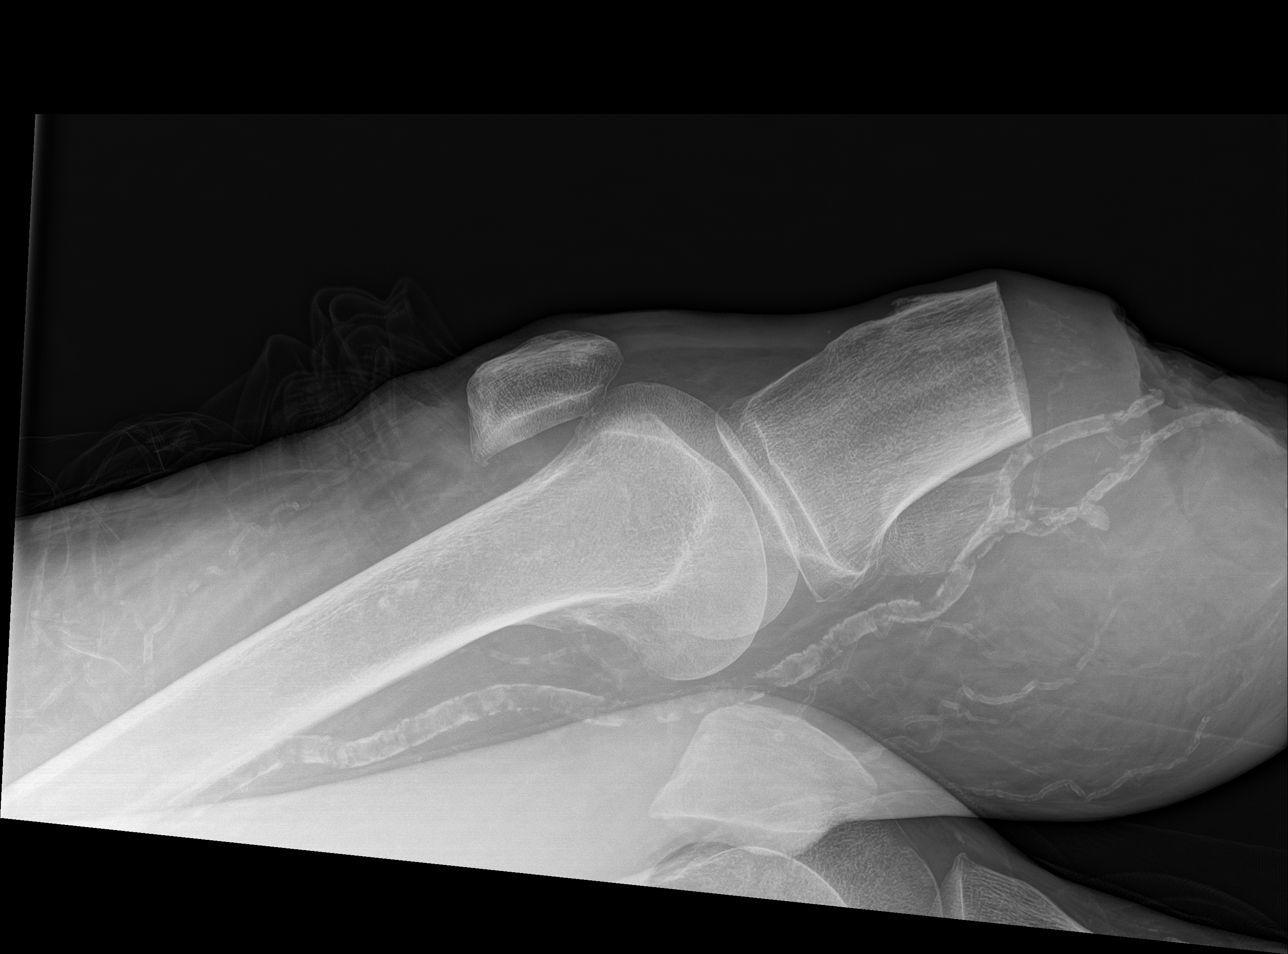

[knee obl (1 of 2)]
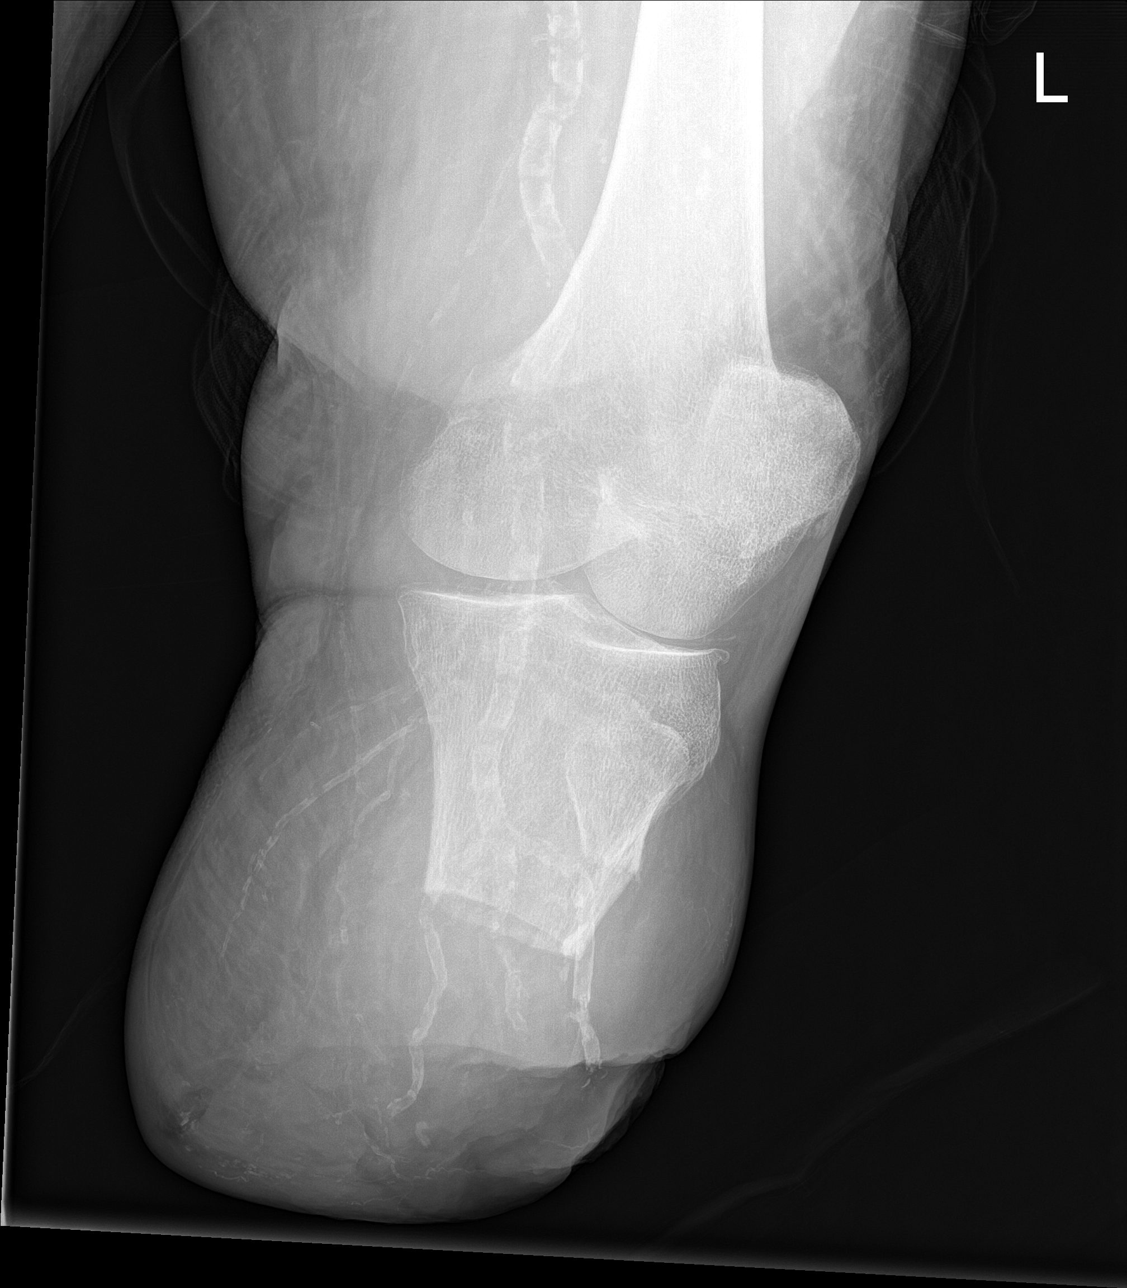

[knee obl (2 of 2)]
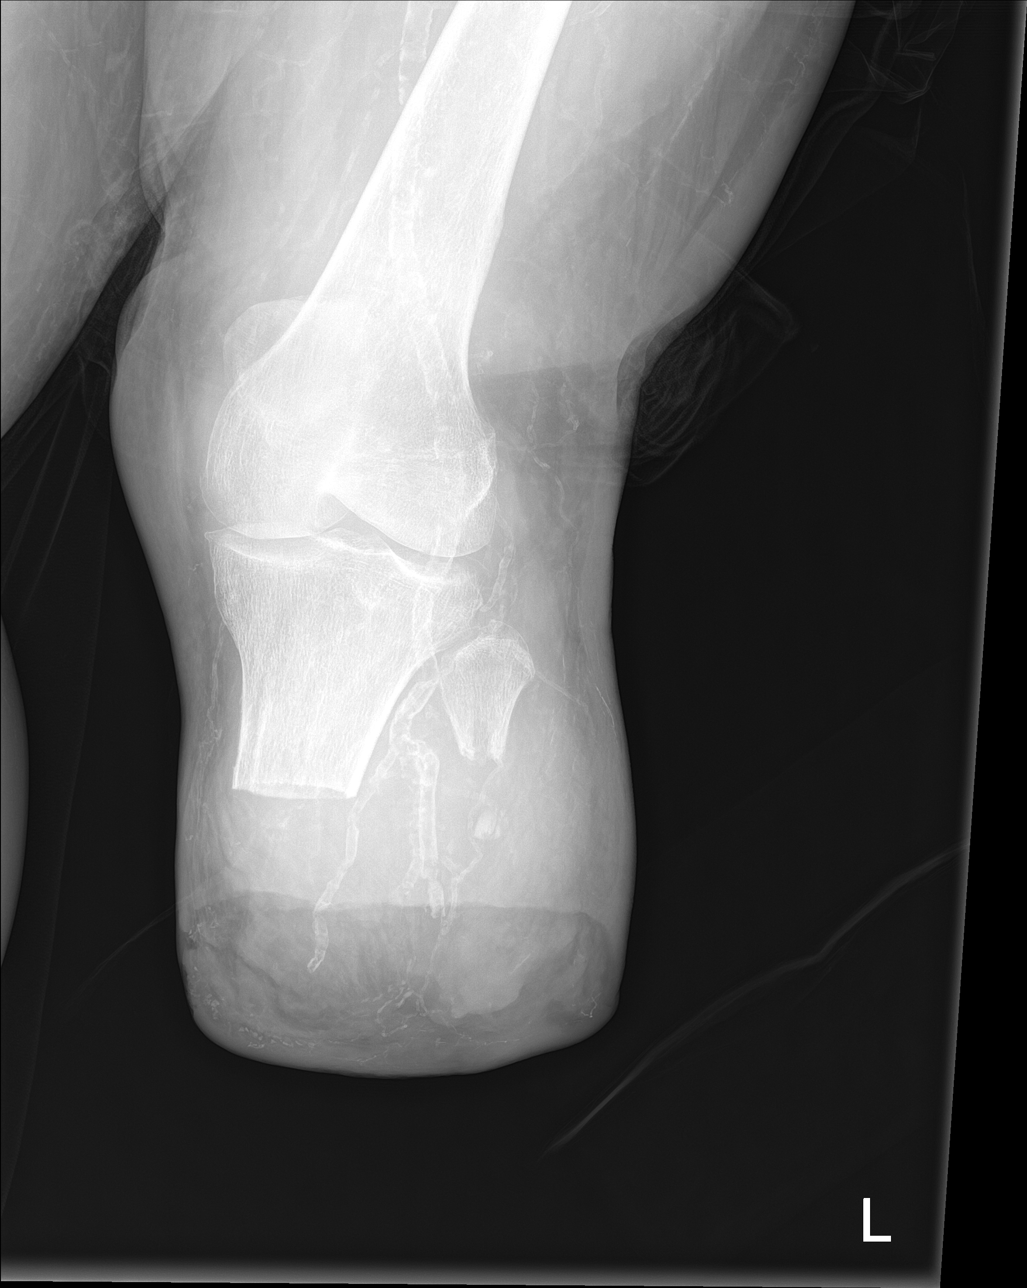

[4 of 4 positions shown; findings below may reference images not displayed]

FINDINGS: Frontal, bilateral oblique, lateral views of the left knee are
obtained. Prior left below-knee amputation. Sharp margin of the
tibial amputation site. Slight irregularity at the fibular
amputation site with small ossific density displaced inferiorly,
likely related to previous surgery. There is extensive soft tissue
swelling of the amputation stump. Extensive vascular calcifications
are seen. No joint effusion. No acute fractures.
IMPRESSION: 1. Extensive soft tissue swelling at the left below-knee amputation
site.
2. No acute fracture.
